# Patient Record
Sex: Male | Born: 1965
Health system: Southern US, Community
[De-identification: ages and names within clinical notes are randomized; demographics above are authoritative.]

## PROBLEM LIST (undated history)

## (undated) DIAGNOSIS — M1990 Unspecified osteoarthritis, unspecified site: Secondary | ICD-10-CM

## (undated) DIAGNOSIS — K760 Fatty (change of) liver, not elsewhere classified: Secondary | ICD-10-CM

## (undated) DIAGNOSIS — I739 Peripheral vascular disease, unspecified: Secondary | ICD-10-CM

## (undated) DIAGNOSIS — H409 Unspecified glaucoma: Secondary | ICD-10-CM

## (undated) DIAGNOSIS — Z9889 Other specified postprocedural states: Secondary | ICD-10-CM

## (undated) DIAGNOSIS — D126 Benign neoplasm of colon, unspecified: Secondary | ICD-10-CM

## (undated) DIAGNOSIS — J302 Other seasonal allergic rhinitis: Secondary | ICD-10-CM

## (undated) DIAGNOSIS — E1169 Type 2 diabetes mellitus with other specified complication: Secondary | ICD-10-CM

## (undated) DIAGNOSIS — H548 Legal blindness, as defined in USA: Secondary | ICD-10-CM

## (undated) DIAGNOSIS — I10 Essential (primary) hypertension: Secondary | ICD-10-CM

## (undated) DIAGNOSIS — I219 Acute myocardial infarction, unspecified: Secondary | ICD-10-CM

## (undated) DIAGNOSIS — H269 Unspecified cataract: Secondary | ICD-10-CM

## (undated) DIAGNOSIS — I2510 Atherosclerotic heart disease of native coronary artery without angina pectoris: Secondary | ICD-10-CM

## (undated) DIAGNOSIS — M5030 Other cervical disc degeneration, unspecified cervical region: Secondary | ICD-10-CM

## (undated) DIAGNOSIS — E11621 Type 2 diabetes mellitus with foot ulcer: Secondary | ICD-10-CM

## (undated) DIAGNOSIS — E119 Type 2 diabetes mellitus without complications: Secondary | ICD-10-CM

## (undated) DIAGNOSIS — E1142 Type 2 diabetes mellitus with diabetic polyneuropathy: Secondary | ICD-10-CM

## (undated) DIAGNOSIS — Z89512 Acquired absence of left leg below knee: Secondary | ICD-10-CM

## (undated) DIAGNOSIS — E1110 Type 2 diabetes mellitus with ketoacidosis without coma: Secondary | ICD-10-CM

## (undated) DIAGNOSIS — M25519 Pain in unspecified shoulder: Secondary | ICD-10-CM

## (undated) DIAGNOSIS — K219 Gastro-esophageal reflux disease without esophagitis: Secondary | ICD-10-CM

## (undated) DIAGNOSIS — R112 Nausea with vomiting, unspecified: Secondary | ICD-10-CM

## (undated) DIAGNOSIS — E785 Hyperlipidemia, unspecified: Secondary | ICD-10-CM

## (undated) DIAGNOSIS — R600 Localized edema: Secondary | ICD-10-CM

## (undated) DIAGNOSIS — N186 End stage renal disease: Secondary | ICD-10-CM

## (undated) DIAGNOSIS — L97505 Non-pressure chronic ulcer of other part of unspecified foot with muscle involvement without evidence of necrosis: Secondary | ICD-10-CM

## (undated) DIAGNOSIS — D649 Anemia, unspecified: Secondary | ICD-10-CM

## (undated) DIAGNOSIS — J45909 Unspecified asthma, uncomplicated: Secondary | ICD-10-CM

## (undated) DIAGNOSIS — Z992 Dependence on renal dialysis: Secondary | ICD-10-CM

## (undated) DIAGNOSIS — G629 Polyneuropathy, unspecified: Secondary | ICD-10-CM

## (undated) DIAGNOSIS — L97509 Non-pressure chronic ulcer of other part of unspecified foot with unspecified severity: Secondary | ICD-10-CM

## (undated) DIAGNOSIS — M869 Osteomyelitis, unspecified: Secondary | ICD-10-CM

## (undated) DIAGNOSIS — I519 Heart disease, unspecified: Secondary | ICD-10-CM

## (undated) DIAGNOSIS — K5790 Diverticulosis of intestine, part unspecified, without perforation or abscess without bleeding: Secondary | ICD-10-CM

## (undated) DIAGNOSIS — N184 Chronic kidney disease, stage 4 (severe): Secondary | ICD-10-CM

## (undated) DIAGNOSIS — M479 Spondylosis, unspecified: Secondary | ICD-10-CM

## (undated) HISTORY — DX: Type 2 diabetes mellitus without complications: E11.9

## (undated) HISTORY — DX: Chronic kidney disease, stage 4 (severe): N18.4

## (undated) HISTORY — DX: Unspecified cataract: H26.9

## (undated) HISTORY — DX: Essential (primary) hypertension: I10

## (undated) HISTORY — DX: Benign neoplasm of colon, unspecified: D12.6

## (undated) HISTORY — DX: Acquired absence of left leg below knee: Z89.512

## (undated) HISTORY — PX: VASECTOMY: SHX75

## (undated) HISTORY — DX: Hyperlipidemia, unspecified: E78.5

## (undated) HISTORY — DX: Type 2 diabetes mellitus with other specified complication: M86.9

## (undated) HISTORY — DX: Gilbert syndrome: E80.4

## (undated) HISTORY — DX: Osteomyelitis, unspecified: M86.9

## (undated) HISTORY — DX: Other cervical disc degeneration, unspecified cervical region: M50.30

## (undated) HISTORY — PX: CATARACT EXTRACTION W/ INTRAOCULAR LENS IMPLANT: SHX1309

## (undated) HISTORY — PX: CARDIAC CATHETERIZATION: SHX172

## (undated) HISTORY — DX: Acute myocardial infarction, unspecified: I21.9

## (undated) HISTORY — DX: Legal blindness, as defined in USA: H54.8

## (undated) HISTORY — DX: Diverticulosis of intestine, part unspecified, without perforation or abscess without bleeding: K57.90

## (undated) HISTORY — DX: Type 2 diabetes mellitus with diabetic polyneuropathy: E11.42

## (undated) HISTORY — DX: Non-pressure chronic ulcer of other part of unspecified foot with unspecified severity: L97.509

## (undated) HISTORY — DX: Peripheral vascular disease, unspecified: I73.9

## (undated) HISTORY — DX: Heart disease, unspecified: I51.9

## (undated) HISTORY — DX: Type 2 diabetes mellitus with foot ulcer: L97.505

## (undated) HISTORY — PX: CHOLECYSTECTOMY: SHX55

## (undated) HISTORY — PX: EPIBLEPHERON REPAIR WITH TEAR DUCT PROBING: SHX5617

## (undated) HISTORY — DX: Spondylosis, unspecified: M47.9

## (undated) HISTORY — PX: EYE SURGERY: SHX253

## (undated) HISTORY — DX: Type 2 diabetes mellitus with other specified complication: E11.69

## (undated) HISTORY — DX: Type 2 diabetes mellitus with foot ulcer: E11.621

## (undated) HISTORY — DX: Gastro-esophageal reflux disease without esophagitis: K21.9

## (undated) HISTORY — DX: Type 2 diabetes mellitus with ketoacidosis without coma: E11.10

---

## 1997-11-10 ENCOUNTER — Other Ambulatory Visit: Admission: RE | Admit: 1997-11-10 | Discharge: 1997-11-10 | Payer: Self-pay

## 1998-05-06 ENCOUNTER — Encounter: Payer: Self-pay | Admitting: Emergency Medicine

## 1998-05-06 ENCOUNTER — Emergency Department (HOSPITAL_COMMUNITY): Admission: EM | Admit: 1998-05-06 | Discharge: 1998-05-06 | Payer: Self-pay | Admitting: Emergency Medicine

## 2002-10-07 ENCOUNTER — Inpatient Hospital Stay (HOSPITAL_COMMUNITY): Admission: AD | Admit: 2002-10-07 | Discharge: 2002-10-10 | Payer: Self-pay | Admitting: Internal Medicine

## 2002-10-09 ENCOUNTER — Encounter: Payer: Self-pay | Admitting: Internal Medicine

## 2002-10-10 ENCOUNTER — Encounter (INDEPENDENT_AMBULATORY_CARE_PROVIDER_SITE_OTHER): Payer: Self-pay | Admitting: Internal Medicine

## 2005-04-14 HISTORY — PX: CORONARY ANGIOPLASTY WITH STENT PLACEMENT: SHX49

## 2005-05-12 ENCOUNTER — Other Ambulatory Visit: Payer: Self-pay

## 2005-05-13 ENCOUNTER — Observation Stay: Payer: Self-pay

## 2006-07-17 ENCOUNTER — Encounter (INDEPENDENT_AMBULATORY_CARE_PROVIDER_SITE_OTHER): Payer: Self-pay | Admitting: *Deleted

## 2006-07-17 ENCOUNTER — Ambulatory Visit: Payer: Self-pay | Admitting: Internal Medicine

## 2006-07-17 ENCOUNTER — Ambulatory Visit (HOSPITAL_COMMUNITY): Admission: RE | Admit: 2006-07-17 | Discharge: 2006-07-17 | Payer: Self-pay | Admitting: Internal Medicine

## 2006-07-17 DIAGNOSIS — R05 Cough: Secondary | ICD-10-CM | POA: Insufficient documentation

## 2006-07-17 DIAGNOSIS — I10 Essential (primary) hypertension: Secondary | ICD-10-CM | POA: Insufficient documentation

## 2006-07-17 DIAGNOSIS — I152 Hypertension secondary to endocrine disorders: Secondary | ICD-10-CM | POA: Insufficient documentation

## 2006-07-17 DIAGNOSIS — E1159 Type 2 diabetes mellitus with other circulatory complications: Secondary | ICD-10-CM | POA: Insufficient documentation

## 2006-07-17 DIAGNOSIS — I2510 Atherosclerotic heart disease of native coronary artery without angina pectoris: Secondary | ICD-10-CM | POA: Insufficient documentation

## 2006-07-17 DIAGNOSIS — E11319 Type 2 diabetes mellitus with unspecified diabetic retinopathy without macular edema: Secondary | ICD-10-CM | POA: Insufficient documentation

## 2006-07-17 DIAGNOSIS — E785 Hyperlipidemia, unspecified: Secondary | ICD-10-CM

## 2006-07-17 DIAGNOSIS — R059 Cough, unspecified: Secondary | ICD-10-CM | POA: Insufficient documentation

## 2006-07-17 DIAGNOSIS — E1139 Type 2 diabetes mellitus with other diabetic ophthalmic complication: Secondary | ICD-10-CM

## 2006-07-17 DIAGNOSIS — E118 Type 2 diabetes mellitus with unspecified complications: Secondary | ICD-10-CM | POA: Insufficient documentation

## 2006-07-17 DIAGNOSIS — E1169 Type 2 diabetes mellitus with other specified complication: Secondary | ICD-10-CM | POA: Insufficient documentation

## 2006-07-17 DIAGNOSIS — M1990 Unspecified osteoarthritis, unspecified site: Secondary | ICD-10-CM | POA: Insufficient documentation

## 2006-07-17 DIAGNOSIS — E1165 Type 2 diabetes mellitus with hyperglycemia: Secondary | ICD-10-CM

## 2006-07-17 LAB — CONVERTED CEMR LAB
Blood Glucose, Fingerstick: 299
Creatinine, Urine: 80.7 mg/dL
Hgb A1c MFr Bld: >14 %
Microalb Creat Ratio: 23.7 mg/g (ref 0.0–30.0)
Microalb, Ur: 1.91 mg/dL — ABNORMAL HIGH (ref 0.00–1.89)

## 2006-07-20 ENCOUNTER — Telehealth (INDEPENDENT_AMBULATORY_CARE_PROVIDER_SITE_OTHER): Payer: Self-pay | Admitting: *Deleted

## 2006-07-20 ENCOUNTER — Encounter (INDEPENDENT_AMBULATORY_CARE_PROVIDER_SITE_OTHER): Payer: Self-pay | Admitting: Internal Medicine

## 2006-07-20 LAB — CONVERTED CEMR LAB
ALT: 24 units/L (ref 0–53)
AST: 18 units/L (ref 0–37)
Albumin: 4.1 g/dL (ref 3.5–5.2)
Alkaline Phosphatase: 78 units/L (ref 39–117)
BUN: 7 mg/dL (ref 6–23)
CO2: 26 meq/L (ref 19–32)
Calcium: 9.5 mg/dL (ref 8.4–10.5)
Chloride: 102 meq/L (ref 96–112)
Cholesterol: 249 mg/dL — ABNORMAL HIGH (ref 0–200)
Creatinine, Ser: 0.72 mg/dL (ref 0.40–1.50)
Glucose, Bld: 304 mg/dL — ABNORMAL HIGH (ref 70–99)
HDL: 38 mg/dL — ABNORMAL LOW (ref >39)
LDL Cholesterol: 156 mg/dL — ABNORMAL HIGH (ref 0–99)
Potassium: 4.4 meq/L (ref 3.5–5.3)
Sodium: 134 meq/L — ABNORMAL LOW (ref 135–145)
Total Bilirubin: 1.9 mg/dL — ABNORMAL HIGH (ref 0.3–1.2)
Total CHOL/HDL Ratio: 6.6
Total Protein: 6.7 g/dL (ref 6.0–8.3)
Triglycerides: 273 mg/dL — ABNORMAL HIGH (ref <150)
VLDL: 55 mg/dL — ABNORMAL HIGH (ref 0–40)

## 2006-10-22 ENCOUNTER — Inpatient Hospital Stay (HOSPITAL_COMMUNITY): Admission: EM | Admit: 2006-10-22 | Discharge: 2006-10-23 | Payer: Self-pay | Admitting: Emergency Medicine

## 2006-10-22 ENCOUNTER — Ambulatory Visit: Payer: Self-pay | Admitting: Internal Medicine

## 2006-10-25 ENCOUNTER — Emergency Department (HOSPITAL_COMMUNITY): Admission: EM | Admit: 2006-10-25 | Discharge: 2006-10-25 | Payer: Self-pay | Admitting: Emergency Medicine

## 2006-10-26 ENCOUNTER — Encounter (INDEPENDENT_AMBULATORY_CARE_PROVIDER_SITE_OTHER): Payer: Self-pay | Admitting: Internal Medicine

## 2006-10-27 ENCOUNTER — Encounter (HOSPITAL_COMMUNITY): Admission: RE | Admit: 2006-10-27 | Discharge: 2006-11-05 | Payer: Self-pay | Admitting: Internal Medicine

## 2006-10-28 ENCOUNTER — Encounter (INDEPENDENT_AMBULATORY_CARE_PROVIDER_SITE_OTHER): Payer: Self-pay | Admitting: Internal Medicine

## 2009-04-14 DIAGNOSIS — I519 Heart disease, unspecified: Secondary | ICD-10-CM

## 2009-04-14 HISTORY — DX: Heart disease, unspecified: I51.9

## 2009-11-12 DIAGNOSIS — I219 Acute myocardial infarction, unspecified: Secondary | ICD-10-CM

## 2009-11-12 HISTORY — DX: Acute myocardial infarction, unspecified: I21.9

## 2009-11-14 ENCOUNTER — Ambulatory Visit: Payer: Self-pay | Admitting: Internal Medicine

## 2009-11-20 ENCOUNTER — Ambulatory Visit: Payer: Self-pay | Admitting: Cardiology

## 2009-11-20 DIAGNOSIS — Z955 Presence of coronary angioplasty implant and graft: Secondary | ICD-10-CM

## 2009-11-20 HISTORY — DX: Presence of coronary angioplasty implant and graft: Z95.5

## 2009-11-20 HISTORY — PX: CORONARY ANGIOPLASTY WITH STENT PLACEMENT: SHX49

## 2010-03-18 ENCOUNTER — Ambulatory Visit: Payer: Self-pay | Admitting: Internal Medicine

## 2010-05-14 NOTE — Assessment & Plan Note (Signed)
Summary: dsmt/dmr  DIABETES SELF MANAGEMENT TRAINING  Comments: dm x 20 years, last education was  ~ 1990. used to be  ~ 300#. lost weight when diagnosed intentionally and has been mainatining weight and good control on pills until last 2 months when he had been out of medications. Behavior change agreed on this visit: test CBG before breakfast and supper minimally, try to keep carbs consistent from day to day, meal to meal  1-Diabetes disease process: basic knowledge, reviewed more today 2- Exercise: on the job daily 3-Nutritional Management: describes intake as meals three times a day, small snack qama nd bedtime. watches sugar and carbs, Educated pt on carb consistency today and label reading. 4- Medications: on insulin previously, but not aware of relationship of insulin to carbs, insulin action. Marland Kitchen 5- Self-monitoring Blood Glucose: unable to name meter and not brought in today, usually tests daily, encouraged more testing especailly while transtioning to insulin. 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 6- Preventing,detecting,treating Chronic Complications: not covered today 7-Preventing,detecting treating acute complications: covered informally today, needs further assessment and review at f/u. 8- Changing habits: not covered today 9- Psychosocial adjustment: verbalized  F/U: as needed

## 2010-05-14 NOTE — Progress Notes (Signed)
Summary: Zocor  Phone Note Outgoing Call Call back at [facility] Phone [phone]   Call placed by: Pollyann Samples,  July 20, 2006 11:30 AM Summary of Call: Called to tell pt about Zocor. Left msg on answering machine.    Follow-up for Phone Call        Called pt's work number and left a msg fr him to call me. Follow-up by: Pollyann Samples,  July 21, 2006 9:31 AM  Additional Follow-up for Phone Call Additional follow up Details #1::        Called pt and informed him of the new Rx for Zocor. He requested that we call it in to the [facility] on [address] in [place]. Additional Follow-up by: Pollyann Samples,  July 21, 2006 4:52 PM   Additional Follow-up for Phone Call Additional follow up Details #2::    Called in Rx. Follow-up by: Pollyann Samples,  July 21, 2006 4:59 PM

## 2010-05-14 NOTE — Miscellaneous (Signed)
Summary: Hyperlipidemia; eval and treatment  Clinical Lists Changes  Medications: Added new medication of ZOCOR 20 MG TABS (SIMVASTATIN) Take 1 tablet by mouth at bedtime - Signed Rx of ZOCOR 20 MG TABS (SIMVASTATIN) Take 1 tablet by mouth at bedtime;  #31 x 2;  Signed;  Entered by: Deborra Medina MD;  Authorized by: Deborra Medina MD;  Method used: Telephoned to Orders: Added new Test order of T-Lipid Profile [phone]) - Signed Added new Test order of T-Hepatic Function [phone]) - Signed

## 2010-05-14 NOTE — Letter (Signed)
Summary: Discharge Summary-Dr. Albertine Patricia  Discharge Summary-Dr. Albertine Patricia   Imported By: Ollen Bowl 07/30/2006 09:46:10  _____________________________________________________________________  External Attachment:    Type:   Image     Comment:   External Document

## 2010-05-14 NOTE — Miscellaneous (Signed)
Summary: Consents: [facility]  Consents: [facility]   Imported By: Bonner Puna 10/27/2006 11:57:51  _____________________________________________________________________  External Attachment:    Type:   Image     Comment:   External Document
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
## 2010-08-27 NOTE — Discharge Summary (Signed)
NAME:  Chase Clark, Chase Clark NO.:  [id number]   MEDICAL RECORD NO.:  [id number]          PATIENT TYPE:  INP   LOCATION:  5501                         FACILITY:  [facility]   PHYSICIAN:  Lincoln Maxin, MD    DATE OF BIRTH:  [birth year]   DATE OF ADMISSION:  10/22/2006  DATE OF DISCHARGE:  10/23/2006                               DISCHARGE SUMMARY   CHIEF COMPLAINT:  Headache.   DISCHARGE DIAGNOSES:  1. Presumed [facility] spotted fever.  2. Diabetes.  3. Hypertension.  4. Hyperlipidemia.   DISCHARGE MEDICATIONS:  1. Zocor 40 mg one daily.  2. Lisinopril/hydrochlorothiazide 20/12.5 one daily.  3. NovoLog 70/30 14 units in a.m. and 28 units in p.m.  4. Metformin 1,000 mg twice daily.  5. Doxycycline 100 mg twice daily x13 days.   DISPOSITION/FOLLOWUP:  The patient is to follow up with Dr. Deborra Medina  in [place].  The patient and his wife both said they would call to  make a follow-up appointment.  At this follow-up appointment Dr. Derrel Nip  can check CBC and BMP.  It will also be important to monitor the  patient's blood pressure, as his blood pressure was over 240 on initial  presentation.   PROCEDURES:  1. Lumbar puncture on October 22, 2006.  2. CT head without contrast.  Impression:  No acute intracranial      abnormalities.  3. MRA head.  Impression:  Normal intracranial MR angiography of large      and medium size vessels dated October 22, 2006.  4. MRI of the brain with and without contrast on October 22, 2006.      Impression:  Normal MRI of the brain.   CULTURES:  1. CSF culture:  No growth x1 day.  2. CSF Gram stain:  No growth, white blood cells present,      predominantly mononuclear, red blood cells present, no organisms      seen.   CONSULTS:  None.   BRIEF HISTORY OF PRESENT ILLNESS:  The patient is a 46 year old white  male with a past medical history of diabetes type 2, hemoglobin 14.0,  hypertension, hyperlipidemia, who came to the ED  complaining of  headache.  The patient had one headache four days prior to arrival that  started suddenly and lasted about two minutes.  This headache resolved  by itself.  One day prior to arrival at about 10 p.m. the patient was  leaving church when he got another headache which came on suddenly.  This headache lasted about 20 minutes.  As the patient was walking out  of the church the headache was so painful it brought him to his knees.  He refers to the headache as the worst headache of his life.  The  patient became nauseous and started vomiting.  The patient then got in  his car and drove.  After 20 minutes the headache began to decrease but  did not resolve.  The patient vomited at least three times while  driving.  He had to pull over his car to vomit.  There was no blood  in  his vomit.  He was suffering from photophobia.  The patient states the  headache started in the back of his head and moved to the front.  The  patient complained of watery eyes.  The pain was described as squeezing  and bilateral.  When the patient lied flat the headache was accentuated.  The patient saw a chiropractor two weeks ago, which may or may not be  related.  The patient denies any aura, any dizziness, no changes in  vision, no fevers, no chills, no cough, no diarrhea.  The patient also  does acknowledge [place], and in the past two  weeks found at least two ticks on his body.   ALLERGIES:  No known drug allergies.   PAST MEDICAL HISTORY:  1. Diabetes, hemoglobin A1c greater than 14 in April of 2008.  2. Hypertension.  3. Hyperlipidemia.  4. The patient had a cath which showed an ejection fraction of 66%, no      wall motion abnormalities, no evidence of CAD.   HOME MEDICATIONS:  1. Janumet one tablet b.i.d., dosing is 50/1,000.  2. Lisinopril/hydrochlorothiazide 20/12.5 daily.  3. NovoLog 70/30 14 units in the a.m. and 28 units in the p.m.  4. Zocor 20 mg daily.  5.  Aspirin 81 mg daily.   SOCIAL HISTORY:  The patient is a current smoker of 1/2 pack per day x20  years.  He drinks occasionally, drink of choice is beer.  The patient  denies cocaine or IV drug use.  He has smoked marijuana in the past.  The patient is married.  He is currently working in [employer].  He  has a past history as an EMT for 12 years.  The patient's wife is a  Marine scientist.   FAMILY HISTORY:  The patient's mother has diabetes and hypertension.  The patient's father is deceased at 89 secondary to Kidron.  He also  had an MI which was a complication of his lymphoma treatment.  The  patient has two daughters, one 64 and one 48.   REVIEW OF SYSTEMS:  Negative except as noted in HPI.   PHYSICAL EXAMINATION:  VITAL SIGNS:  Temperature 97.2, blood pressure  201/128, pulse 94, respiratory rate 22, O2 99% on room air.  GENERAL:  The patient is lying in bed with the lights off in no acute  distress.  HEENT:  Eyes:  Anicteric.  No pallor.  ENT:  Mucous membranes moist.  No  erythema.  No discharge.  Midline tongue.  RESPIRATORY:  Good air movement.  Clear to auscultation bilaterally.  CARDIOVASCULAR:  Regular rate and rhythm.  Normal S1 and S2.  GI:  Positive bowel sounds.  Soft, nontender.  No distention.  EXTREMITIES:  Normal pulses.  No edema.  SKIN:  No rashes.  LYMPH:  No lymphadenopathy.  MUSCULOSKELETAL:  No deformities.  NEURO:  Alert and oriented x3.  Cranial nerves II-XII grossly intact.  5/5 muscular strength upper and lower extremities.  1+ deep tendon  reflexes and symmetric.  Sensation intact throughout upper and lower  extremities.  Finger-to-nose intact.   LABORATORY DATA:  CBC:  White count 14.7, hemoglobin 15.1, platelets  194, ANC 12.0, MCV 90.5.  BMP:  Sodium 133, potassium 4.8, chloride 105,  bicarb 24, BUN 15, creatinine 0.7, glucose 272.  LP was performed in the  ED and showed mil increase in opening pressure at 26 cm.  Glucose  slightly elevated at 145.   Protein  slightly elevated at 80.  There were  white blood cells present which were predominantly PMNs, red blood cells  as well with no organisms.  There were 2 white blood cells.  Red blood  cells were 86.   HOSPITAL COURSE:  1. Headache.  The patient came in stating the headache was the worst      headache of his life.  Initially the thought was to rule out      subarachnoid hemorrhage.  CT of the head was done with was negative      for any hemorrhage or acute abnormality.  To reaffirm this a lumbar      puncture was done which was negative except for a small amount of      traumatic blood.  Other causes considered for headache included      [facility] spotted fever with history of tick exposure.  [facility] spotted fever can present with headache and myalgias.  The      patient does not have any rash and only mildly elevated fever.  The      patient does have increased protein and increased glucose in CSF      which is common with [facility] spotted fever.  On admission      the patient also had increased T-bili at 1.8 which can commonly be      seen with [facility] spotted fever.  The rest of the LFTs were      normal, alkaline phosphatase 43, AST 20, ALT 24.  Intoxication was      considered.  Alcohol level was obtained and was less than 5. UDS      was also negative except for opiates which the patient received in      the ED.  A hypertensive malignancy could have been the cause for      this headache.  Hypertensive malignancy could have occurred because      the patient's blood pressure was elevated at 201.  Upon arrival in      the ED the blood pressure dropped coinciding with the headache      subsequently being relieved.  The patient does not have a history      of migraines nor a family history of migraines.  Migraines are less      typical in individuals greater than 30 years ago.  The patient also      had no aura, yet this possibility cannot be  completely ruled out.      Other causes such as viral or bacterial meningitis are less likely      as well.  The patient's CSF was not typical for a meningitis type      syndrome, and the patient had no nuchal rigidity.  The patient was      afebrile.  Lastly, things like cluster headaches could potentially      be a cause.  Cluster headaches typically occur much more frequently      in males but often are unilateral and occurring behind one eye.      The patient was brought into the hospital and started on      doxycycline prophylactically 100 mg p.o. b.i.d.  Upon admission the      patient's headache resolved.  The headache did not recur overnight,      and the next morning the patient's white count had gone down to      9.1.  This made [place] feel like the diagnosis of [facility]      spotted fever was more likely, and we discharged the patient with      an additional 13 days of doxycycline making it a full course of 14      days.  2. Hypertension.  On admission the patient's systolic blood pressure      was greater than 200.  This could have been secondary to anxiety,      but also could have been the cause for his headache.  After      receiving blood pressure medications, the patient's blood pressure      remained between the 120s to 140s.  At discharge the patient's home      medications were restarted which included lisinopril 20,      hydrochlorothiazide 12.5.  3. Diabetes.  The patient's blood sugar ranged from 183-230.  The      patient had a hemoglobin A1c drawn which was 9.9, and this was an      improvement over his last hemoglobin A1c which was greater than 14.      The patient states he is compliant with his home diabetes      medications.  The patient takes his metformin 1 gm b.i.d.  The      patient also takes NovoLog 70/30 at 14 units in the a.m. and 28      units in the p.m.  The patient states that this regimen is atypical      for most patients, but in working with  Dr. Derrel Nip it was found that      his glucose spikes overnight, and this regimen is appropriate for      him.  Dr. Derrel Nip has established a long relationship with this      patient and we will defer to her management.  4. Hyperlipidemia.  Fasting lipid panel was obtained while the patient      was hospitalized.  The patient's total cholesterol was 200,      triglycerides 227, HDL 34, LDL 121.  The patient was currently on      Zocor 20 mg.  While hospitalized the patient's Zocor was increased      to 40 mg.  No other changes to hyperlipidemia medications were      made, although adding a medication like niacin can be considered by      the patient's primary care physician.  The patient has multiple      risk factors for ACS including diabetes, hypertension,      hyperlipidemia and smoking.   DISCHARGE LABS AND VITALS:  Temperature 99.2, blood pressure 137/87,  pulse 83, respiratory rate 18, satting 97% on room air.  CBC:  White  count 9.1, hemoglobin  14.5, platelets 186.  BMP:  Sodium 135, potassium  4.0, chloride 103, bicarb 26, BUN 11, creatinine 0.8, glucose 339,  calcium 8.8.      Lincoln Maxin, MD  Electronically Signed     JP/MEDQ  D:  10/23/2006  T:  10/24/2006  Job:  [id number]   cc:   Deborra Medina, M.D.

## 2010-08-30 NOTE — H&P (Signed)
NAME:  Chase Clark, HAUBNER NO.:  [id number]   MEDICAL RECORD NO.:  [id number]                   PATIENT TYPE:  INP   LOCATION:                                       FACILITY:  [facility]   PHYSICIAN:  Deboraha Sprang, M.D.               DATE OF BIRTH:  1966-01-24   DATE OF ADMISSION:  10/07/2002  DATE OF DISCHARGE:  10/10/2002                                HISTORY & PHYSICAL   CHIEF COMPLAINT:  Abnormal Cardiolite.   HISTORY OF PRESENT ILLNESS:  Mr. Moskwa is a very pleasant 45 year old  male, with no known history of coronary artery disease, but a past medical  history of diabetes mellitus type 2, insulin-dependent, hypertension, and  treated hyperlipidemia.  He was originally seen by Dr. Vicenta Aly in 2002,  with complaints of chest pain.  At that time he had a Cardiolite performed  that was negative.  Recently he decided to have follow-up testing performed.  He denies any symptoms of chest pain, shortness of breath, PND, orthopnea,  syncope, palpitations, diaphoresis, nausea, or arm pain.  He was referred by  Dr. Virgina Jock for stress Cardiolite.  His Cardiolite is positive for inferior  ischemia.  He is seen in the office today to be set up for cardiac  catheterization.  During his time in the office, he did develop some chest  burning and it has been decided to admit him to rule out MI and  catheterization as soon as possible.   PAST MEDICAL HISTORY:  1. Diabetes mellitus, type 2, insulin dependent.  2. Hypertension.  3. Treated hyperlipidemia.  4. Status post vasectomy.  5. History of elevated LFT's.     a. Status post liver biopsy.  Okay per his report.   CURRENT MEDICATIONS:  1. Glucotrol 15 mg daily.  2. Glucophage 500 mg 2 tablets daily.  3. Lipitor 2 mg q.h.s.  4. Aceon 4 mg daily.  5. Aspirin 81 mg daily.  6. Lantus 17 units q.h.s.   ALLERGIES:  No known drug allergies.   SOCIAL HISTORY:  The patient is an ex-smoker, quit about ten months  ago.  He  used to smoke anywhere from less than half a pack per day for ten years.  He  denies any drug use.  He drinks alcohol on occasion.  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   FAMILY HISTORY:  Significant for remote coronary artery disease.  His father  did die from a cardiomyopathy, sounds like it was secondary to chemotherapy  or radiation.  He has two uncles that had coronary artery disease.   REVIEW OF SYSTEMS:  See HPI.  He denies any known hematochezia, dysuria,  hematuria, rashes, myalgias, arthralgias.  He has been under quite a bit of  stress recently.   PHYSICAL EXAMINATION:  GENERAL:  Well-nourished, well developed male in no  acute distress.  VITAL SIGNS:  Blood pressure 132/95.  Pulse 83.  Weight 225 lbs.  HEENT:  Unremarkable.  PERRL.  EOMI.  Sclerae white.  NECK:  Without carotid bruits or thyromegaly.  CARDIAC:  Normal S1, S2, regular rate and rhythm.  LUNGS:  Clear to auscultation bilaterally.  ABDOMEN:  Soft, nontender.  No bruits.  No masses.  VASCULAR:  With 2+ femoral artery, dorsalis pedis, and posterior tibialis  pulses with no femoral artery bruits bilaterally.  EXTREMITIES:  Without edema.  NEUROLOGIC:  Grossly intact.   Electrocardiogram in the office today revealed normal sinus rhythm, heart  rate 83, normal axis.  Nonspecific ST-T wave changes.   IMPRESSION:  1. Unstable angina.  2. Abnormal Cardiolite.  3. Cardiac risk factors.     a. Diabetes mellitus, type 2, insulin-dependent.     b. Treated hypertension.     c. Treated hyperlipidemia.     d. Remote family history of coronary artery disease.     e. Ex-smoker.  4. History of elevated LFT's with a negative liver biopsy.  5. History of vasectomy.   PLAN:  The patient will be admitted to [facility] today.  We have  given him Lovenox 100 mg subcu in the office, and this will be continued as  an inpatient.  We will  start him on Toprol XL 25 mg daily, as well as a full-  strength aspirin daily.  We will rule him out for MI.  We will try to  schedule him for the cath today or Monday, if necessary.  If his enzymes  return positive or if he has unrelenting chest pain, we may need to go  urgently to the cath lab.         Richardson Dopp, P.A.                        Deboraha Sprang, M.D.    SW/MEDQ  D:  10/07/2002  T:  10/08/2002  Job:  [id number]   cc:   Precious Reel, M.D.  [address]  [place]  [place] [address]  Fax: [phone]    cc:   Precious Reel, M.D.  [address]  [place]  [place] [address]  Fax: [phone]

## 2010-08-30 NOTE — Cardiovascular Report (Signed)
NAME:  Chase Clark, Chase Clark                         ACCOUNT NO.:  [id number]   MEDICAL RECORD NO.:  [id number]                   PATIENT TYPE:  INP   LOCATION:  [id number]                                 FACILITY:  [facility]   PHYSICIAN:  Ethelle Lyon, M.D.             DATE OF BIRTH:  1965/11/17   DATE OF PROCEDURE:  10/10/2002  DATE OF DISCHARGE:                              CARDIAC CATHETERIZATION   PROCEDURE:  Left heart catheterization, left ventriculography, coronary  angiography.   INDICATIONS:  The patient is a 45 year old gentleman with risk factors of  diabetes, dyslipidemia, and hypertension.  He has had no chest pain, but  recently underwent an ETT Cardiolite which demonstrated inferior ischemia.  Subsequent to that he had chest discomfort and was admitted to hospital.  He  ruled out for myocardial infarction.  He is referred for diagnostic  angiography.   PROCEDURAL TECHNIQUE:  Informed consent was obtained.  Under 1% lidocaine  local anesthesia a 6-French sheath was placed in the right femoral artery  using the modified Seldinger technique.  Diagnostic angiography and  ventriculography were performed using JL4, JR4, and pigtail catheters.  The  patient tolerated the procedure well and was transferred to the holding room  in stable condition.  Sheaths are to be removed there.   COMPLICATIONS:  None.   FINDINGS:  1. LV 123/7/12.  EF 66% without regional wall motion abnormality.  2. No aortic stenosis or mitral regurgitation.  3. Left main:  Very long vessel which arises from the left coronary cusp.     It is angiographically normal.  4. LAD:  The LAD is a relatively small vessel giving rise to one large     diagonal branch.  The LAD barely reaches the apex of the heart.  While     the vessel is small, there is no evident stenosis.  5. Ramus intermedius:  Small vessel which is angiographically normal.  6. Circumflex:  Moderately sized vessel giving rise to a single  branching     obtuse marginal.  It is angiographically normal.  7. RCA:  Moderate sized, dominant vessel.  It is angiographically normal.   IMPRESSION/RECOMMENDATIONS:  Angiographically normal coronary arteries with  normal left ventricular size and systolic function.  No aortic stenosis or  mitral regurgitation.   Will add ACE inhibitor for primary prevention in the setting of his diabetes  mellitus.  Recommend continued aspirin as well.                                               Ethelle Lyon, M.D.    WED/MEDQ  D:  10/10/2002  T:  10/10/2002  Job:  [id number]  Precious Reel, M.D.  [address]  [place]  [place] [address]  Fax: [phone]  Junious Silk, M.D. [facility]   cc:   Precious Reel, M.D.  [address]  [place]  [place] [address]  Fax: [phone]   Junious Silk, M.D. [facility]

## 2010-08-30 NOTE — Discharge Summary (Signed)
NAME:  Chase Clark, Chase Clark                         ACCOUNT NO.:  [id number]   MEDICAL RECORD NO.:  [id number]                   PATIENT TYPE:  INP   LOCATION:  [id number]                                 FACILITY:  [facility]   PHYSICIAN:  Ethelle Lyon, M.D.             DATE OF BIRTH:  1965/10/20   DATE OF ADMISSION:  10/07/2002  DATE OF DISCHARGE:  10/10/2002                           DISCHARGE SUMMARY - REFERRING   SUMMARY OF HISTORY:  Chase Clark is a 45 year old white male who has  multiple cardiac risk factors for coronary artery disease including type 2  diabetes, hypertension, hyperlipidemia.  He recently underwent a Cardiolite  stress test at his request and this was found to be abnormal, showing an  inferior ischemia and EF of 52%.  Thus, he was admitted for cardiac  catheterization for further evaluation.  The chest discomfort he describes  as a burning and it is nonspecific.  He was admitted from the office.  His  history is also notable for remote tobacco use.   LABORATORY DATA:  Two CK-MBs and troponins were negative for myocardial  infarction.  TSH was 1.289.  Admission sodium was 137, potassium 3.7, BUN  12, creatinine 0.8, glucose 211, normal LFTs.  PT 14.0, INR 1.1.  H&H 15.3  and 46.8, normal indices, platelets 182, wbc's 8.4.  EKG shows normal sinus  rhythm, normal axis, early R waves, nonspecific ST-T wave changes.   HOSPITAL COURSE:  Chase Clark was admitted to 3700 with anticipation of  cardiac catheterization to further evaluate his atypical chest discomfort  and abnormal Cardiolite.  He was continued on his home medications.  The  diabetes coordinator saw the patient in regards to his history of diabetes  and hyperglycemia.  His Glucophage was held in anticipation of cardiac  catheterization.  This was performed on October 07, 2002 without difficulty.  According to Dr. Christy Sartorius notes he did not have any coronary artery disease.  EF was 66%.  He had a small LAD.  Dr.  Albertine Patricia felt that he should be on an  ACE inhibitor for prevention; however, he is already on Aceon and thus will  not change it at this time.  The diabetes coordinator noted that his  hemoglobin A1c was 10.5.  However, he has been following with Dr. Virgina Jock very  closely in regards to his sugars and has a drastic improvement over the last  several weeks, and recommended continued follow-up.  Postcatheterization,  sheath removal, and bedrest he was ambulating without difficulty;  catheterization site was intact.  It was felt that he could be discharged  home.   DISCHARGE DIAGNOSES:  1. Noncardiac chest discomfort.  2. No coronary artery disease as previously described.  3. Multiple cardiac risk factors include diabetes, hypertension,     hyperlipidemia, a false positive Cardiolite, remote tobacco use.   DISPOSITION:  1. He is discharged home.  2. He is asked to continue  his home medications.  These include:     a. Glucotrol XL 15 mg daily.     b. He was asked to resume his Glucophage 500 mg two tablets daily on        Wednesday.     c. Lipitor 10 mg q.h.s.     d. Aceon 4 mg daily.     e. [name] aspirin 81 mg daily.     f. Lantus 17 units q.h.s.  3. He was advised no lifting, driving, sexual activity, or heavy exertion     for two days.  4. Maintain low salt/fat/cholesterol ADA diet.  5. If he had any problems with his catheterization site he was asked to call     [place] immediately.  6. He was asked to arrange a follow-up one- to two-week appointment with Dr.     Virgina Jock for further evaluation of his chest discomfort if it should     persist.  7. We encourage cardiac risk factor modification.  Target LDL should be 70     or less in regards to hyperlipidemia treatment.  We will have Dr. Virgina Jock     follow this accordingly.     Sharyl Nimrod, P.A. [facility]                    Ethelle Lyon, M.D.    EW/MEDQ  D:  10/10/2002  T:  10/10/2002  Job:  [id number]   cc:   Precious Reel, M.D.  [address]  [place]  [place] [address]  Fax: [phone]    cc:   Precious Reel, M.D.  [address]  [place]  [place] [address]  Fax: [phone]

## 2011-01-28 LAB — COMPREHENSIVE METABOLIC PANEL
ALT: 24
AST: 20
Albumin: 3.7
Alkaline Phosphatase: 43
BUN: 12
CO2: 26
Calcium: 9.1
Chloride: 103
Creatinine, Ser: 0.74
GFR calc Af Amer: >60
GFR calc non Af Amer: >60
Glucose, Bld: 240 — ABNORMAL HIGH
Potassium: 4.2
Sodium: 136
Total Bilirubin: 1.8 — ABNORMAL HIGH
Total Protein: 6.3

## 2011-01-28 LAB — CSF CELL COUNT WITH DIFFERENTIAL
RBC Count, CSF: 72 — ABNORMAL HIGH
RBC Count, CSF: 86 — ABNORMAL HIGH
Tube #: 1
Tube #: 4
WBC, CSF: 2
WBC, CSF: 2

## 2011-01-28 LAB — URINALYSIS, ROUTINE W REFLEX MICROSCOPIC
Bilirubin Urine: NEGATIVE
Glucose, UA: >1000 — AB
Hgb urine dipstick: NEGATIVE
Ketones, ur: 40 — AB
Leukocytes, UA: NEGATIVE
Nitrite: NEGATIVE
Protein, ur: NEGATIVE
Specific Gravity, Urine: 1.038 — ABNORMAL HIGH
Urobilinogen, UA: 0.2
pH: 5.5

## 2011-01-28 LAB — MISCELLANEOUS TEST

## 2011-01-28 LAB — ETHANOL: Alcohol, Ethyl (B): <5

## 2011-01-28 LAB — CBC
HCT: 41.4
HCT: 43.3
Hemoglobin: 14.5
Hemoglobin: 15.1
MCHC: 34.9
MCHC: 35.1
MCV: 90.5
MCV: 91.7
Platelets: 184
Platelets: 186
RBC: 4.51
RBC: 4.78
RDW: 13.1
RDW: 13.2
WBC: 14.7 — ABNORMAL HIGH
WBC: 9.1

## 2011-01-28 LAB — POCT I-STAT CREATININE
Creatinine, Ser: 0.7
Creatinine, Ser: 0.8
Operator id: 192351
Operator id: 277751

## 2011-01-28 LAB — BASIC METABOLIC PANEL
BUN: 11
CO2: 26
Calcium: 8.8
Chloride: 103
Creatinine, Ser: 0.8
GFR calc Af Amer: >60
GFR calc non Af Amer: >60
Glucose, Bld: 339 — ABNORMAL HIGH
Potassium: 4
Sodium: 135

## 2011-01-28 LAB — LIPID PANEL
Cholesterol: 200
HDL: 34 — ABNORMAL LOW
LDL Cholesterol: 121 — ABNORMAL HIGH
Total CHOL/HDL Ratio: 5.9
Triglycerides: 227 — ABNORMAL HIGH
VLDL: 45 — ABNORMAL HIGH

## 2011-01-28 LAB — I-STAT 8, (EC8 V) (CONVERTED LAB)
Acid-base deficit: 1
BUN: 13
BUN: 15
Bicarbonate: 22.8
Bicarbonate: 27.6 — ABNORMAL HIGH
Chloride: 102
Chloride: 105
Glucose, Bld: 247 — ABNORMAL HIGH
Glucose, Bld: 272 — ABNORMAL HIGH
HCT: 46
HCT: 47
Hemoglobin: 15.6
Hemoglobin: 16
Operator id: 192351
Operator id: 277751
Potassium: 4
Potassium: 4.8
Sodium: 133 — ABNORMAL LOW
Sodium: 136
TCO2: 24
TCO2: 29
pCO2, Ven: 34.6 — ABNORMAL LOW
pCO2, Ven: 53.9 — ABNORMAL HIGH
pH, Ven: 7.318 — ABNORMAL HIGH
pH, Ven: 7.427 — ABNORMAL HIGH

## 2011-01-28 LAB — URINE MICROSCOPIC-ADD ON

## 2011-01-28 LAB — DIFFERENTIAL
Basophils Absolute: 0
Basophils Relative: 0
Eosinophils Absolute: 0.3
Eosinophils Relative: 2
Lymphocytes Relative: 11 — ABNORMAL LOW
Lymphs Abs: 1.7
Monocytes Absolute: 0.7
Monocytes Relative: 5
Neutro Abs: 12 — ABNORMAL HIGH
Neutrophils Relative %: 82 — ABNORMAL HIGH

## 2011-01-28 LAB — RAPID URINE DRUG SCREEN, HOSP PERFORMED
Amphetamines: NOT DETECTED
Barbiturates: NOT DETECTED
Benzodiazepines: NOT DETECTED
Cocaine: NOT DETECTED
Opiates: POSITIVE — AB
Tetrahydrocannabinol: NOT DETECTED

## 2011-01-28 LAB — GRAM STAIN

## 2011-01-28 LAB — PROTIME-INR
INR: 1
Prothrombin Time: 13.5

## 2011-01-28 LAB — PROTEIN AND GLUCOSE, CSF
Glucose, CSF: 145 — ABNORMAL HIGH
Total  Protein, CSF: 80 — ABNORMAL HIGH

## 2011-01-28 LAB — APTT: aPTT: 25

## 2011-01-28 LAB — CSF CULTURE W GRAM STAIN: Culture: NO GROWTH

## 2011-01-28 LAB — HEMOGLOBIN A1C: Hgb A1c MFr Bld: 9.9 — ABNORMAL HIGH

## 2011-07-18 ENCOUNTER — Ambulatory Visit: Payer: Self-pay | Admitting: Gastroenterology

## 2011-07-30 ENCOUNTER — Ambulatory Visit: Payer: Self-pay | Admitting: Surgery

## 2011-08-05 LAB — PATHOLOGY REPORT

## 2011-12-19 ENCOUNTER — Ambulatory Visit: Payer: Self-pay | Admitting: Gastroenterology

## 2011-12-22 LAB — PATHOLOGY REPORT

## 2012-01-09 LAB — HM COLONOSCOPY: HM Colonoscopy: NORMAL

## 2012-11-17 DIAGNOSIS — L97509 Non-pressure chronic ulcer of other part of unspecified foot with unspecified severity: Secondary | ICD-10-CM

## 2012-11-17 HISTORY — DX: Non-pressure chronic ulcer of other part of unspecified foot with unspecified severity: L97.509

## 2012-12-29 ENCOUNTER — Encounter: Payer: Self-pay | Admitting: *Deleted

## 2012-12-29 DIAGNOSIS — E11621 Type 2 diabetes mellitus with foot ulcer: Secondary | ICD-10-CM | POA: Insufficient documentation

## 2013-01-12 ENCOUNTER — Ambulatory Visit: Payer: Self-pay | Admitting: Podiatry

## 2013-04-04 ENCOUNTER — Ambulatory Visit (INDEPENDENT_AMBULATORY_CARE_PROVIDER_SITE_OTHER): Payer: Managed Care, Other (non HMO) | Admitting: Podiatry

## 2013-04-04 ENCOUNTER — Encounter: Payer: Self-pay | Admitting: Podiatry

## 2013-04-04 VITALS — BP 145/95 | HR 84 | Resp 16

## 2013-04-04 DIAGNOSIS — L97509 Non-pressure chronic ulcer of other part of unspecified foot with unspecified severity: Secondary | ICD-10-CM

## 2013-04-04 MED ORDER — CEPHALEXIN 500 MG PO CAPS: 500.0000 mg | ORAL_CAPSULE | Freq: Three times a day (TID) | ORAL | Status: DC

## 2013-04-04 MED ORDER — GABAPENTIN 300 MG PO CAPS: 300.0000 mg | ORAL_CAPSULE | Freq: Two times a day (BID) | ORAL | Status: DC

## 2013-04-04 NOTE — Progress Notes (Signed)
[name] presents today for followup of his left ankle ulceration. He states that I had healed 100% until he tried to break in a new pair of boots. It rubs sore my ankle as it did with the other areas that this one has not healed as she refers to his left ankle.  Objective: Vital signs are stable he is alert and oriented x3. There is mild erythema surrounding approximately a 1 cm lesion to the lateral malleolus very superficial with with no purulence.  Assessment: Superficial ulceration mild cellulitis lateral malleolus left.  Plan: started him on Keflex 500 mg 3 times a day. He'll continue soaks and dressing changes. I did refill his gabapentin 300 mg 1 twice daily for 90 day supply x3 refills

## 2013-04-14 DIAGNOSIS — K3184 Gastroparesis: Secondary | ICD-10-CM

## 2013-04-14 HISTORY — DX: Gastroparesis: K31.84

## 2013-05-04 ENCOUNTER — Encounter: Payer: Self-pay | Admitting: Podiatry

## 2013-05-04 ENCOUNTER — Ambulatory Visit (INDEPENDENT_AMBULATORY_CARE_PROVIDER_SITE_OTHER): Payer: Managed Care, Other (non HMO) | Admitting: Podiatry

## 2013-05-04 ENCOUNTER — Ambulatory Visit (INDEPENDENT_AMBULATORY_CARE_PROVIDER_SITE_OTHER): Payer: Managed Care, Other (non HMO)

## 2013-05-04 VITALS — BP 150/105 | HR 85 | Resp 16 | Ht 71.0 in | Wt 210.0 lb

## 2013-05-04 DIAGNOSIS — M79671 Pain in right foot: Secondary | ICD-10-CM

## 2013-05-04 DIAGNOSIS — M79609 Pain in unspecified limb: Secondary | ICD-10-CM

## 2013-05-04 DIAGNOSIS — M629 Disorder of muscle, unspecified: Secondary | ICD-10-CM

## 2013-05-04 DIAGNOSIS — M2420 Disorder of ligament, unspecified site: Secondary | ICD-10-CM

## 2013-05-04 NOTE — Progress Notes (Signed)
He presents today stating that Friday he felt something pull in the bottom of his right foot. He has had a history of plantar fasciitis.  Objective: Vital signs are stable he is alert and oriented x3. Pulses are palpable right lower extremity. Pain on palpation to the plantar central and plantar medial band of the plantar fascia right foot. Radiographic evaluation demonstrates what appears to be a disruption in the ligament just distal to its insertion site of the calcaneus.  Assessment: Plantar fascial tear right foot.  Plan: The use of the Cam Walker for the next 4 weeks I will followup with him at that time.

## 2013-05-19 ENCOUNTER — Telehealth: Payer: Self-pay | Admitting: Internal Medicine

## 2013-05-19 NOTE — Telephone Encounter (Signed)
The patient was a former patient of Dr. Lupita Dawn per his wife [name] . He was going to [facility] ,now he is wanting to re-establish with Dr. Derrel Nip.

## 2013-05-19 NOTE — Telephone Encounter (Signed)
Please advise 

## 2013-05-20 NOTE — Telephone Encounter (Signed)
Of course,  Spouses of current patients can be accepted.

## 2013-05-24 NOTE — Telephone Encounter (Signed)
The patient has been scheduled

## 2013-05-25 ENCOUNTER — Other Ambulatory Visit: Payer: Self-pay | Admitting: Internal Medicine

## 2013-05-25 MED ORDER — LISINOPRIL-HYDROCHLOROTHIAZIDE 10-12.5 MG PO TABS
2.0000 | ORAL_TABLET | Freq: Every day | ORAL | Status: DC
Start: 2013-05-25 — End: 2013-06-13

## 2013-06-01 ENCOUNTER — Ambulatory Visit: Payer: Managed Care, Other (non HMO) | Admitting: Podiatry

## 2013-06-10 ENCOUNTER — Encounter (INDEPENDENT_AMBULATORY_CARE_PROVIDER_SITE_OTHER): Payer: Self-pay

## 2013-06-10 ENCOUNTER — Encounter: Payer: Self-pay | Admitting: Internal Medicine

## 2013-06-10 ENCOUNTER — Ambulatory Visit (INDEPENDENT_AMBULATORY_CARE_PROVIDER_SITE_OTHER): Payer: Managed Care, Other (non HMO) | Admitting: Internal Medicine

## 2013-06-10 VITALS — BP 118/74 | HR 96 | Temp 97.6°F | Resp 18 | Ht 70.0 in | Wt 218.0 lb

## 2013-06-10 DIAGNOSIS — Z888 Allergy status to other drugs, medicaments and biological substances status: Secondary | ICD-10-CM

## 2013-06-10 DIAGNOSIS — E1139 Type 2 diabetes mellitus with other diabetic ophthalmic complication: Secondary | ICD-10-CM

## 2013-06-10 DIAGNOSIS — IMO0001 Reserved for inherently not codable concepts without codable children: Secondary | ICD-10-CM

## 2013-06-10 DIAGNOSIS — E1165 Type 2 diabetes mellitus with hyperglycemia: Secondary | ICD-10-CM

## 2013-06-10 DIAGNOSIS — E785 Hyperlipidemia, unspecified: Secondary | ICD-10-CM

## 2013-06-10 DIAGNOSIS — N529 Male erectile dysfunction, unspecified: Secondary | ICD-10-CM

## 2013-06-10 DIAGNOSIS — I10 Essential (primary) hypertension: Secondary | ICD-10-CM

## 2013-06-10 DIAGNOSIS — G589 Mononeuropathy, unspecified: Secondary | ICD-10-CM

## 2013-06-10 DIAGNOSIS — I2510 Atherosclerotic heart disease of native coronary artery without angina pectoris: Secondary | ICD-10-CM

## 2013-06-10 DIAGNOSIS — G629 Polyneuropathy, unspecified: Secondary | ICD-10-CM

## 2013-06-10 DIAGNOSIS — Z789 Other specified health status: Secondary | ICD-10-CM

## 2013-06-10 DIAGNOSIS — M25519 Pain in unspecified shoulder: Secondary | ICD-10-CM

## 2013-06-10 DIAGNOSIS — Z23 Encounter for immunization: Secondary | ICD-10-CM

## 2013-06-10 DIAGNOSIS — E11319 Type 2 diabetes mellitus with unspecified diabetic retinopathy without macular edema: Secondary | ICD-10-CM

## 2013-06-10 LAB — COMPREHENSIVE METABOLIC PANEL
ALT: 27 U/L (ref 0–53)
AST: 28 U/L (ref 0–37)
Albumin: 4.5 g/dL (ref 3.5–5.2)
Alkaline Phosphatase: 66 U/L (ref 39–117)
BUN: 22 mg/dL (ref 6–23)
CO2: 26 mEq/L (ref 19–32)
Calcium: 10.1 mg/dL (ref 8.4–10.5)
Chloride: 101 mEq/L (ref 96–112)
Creat: 1.15 mg/dL (ref 0.50–1.35)
Glucose, Bld: 140 mg/dL — ABNORMAL HIGH (ref 70–99)
Potassium: 4.7 mEq/L (ref 3.5–5.3)
Sodium: 137 mEq/L (ref 135–145)
Total Bilirubin: 1.4 mg/dL — ABNORMAL HIGH (ref 0.2–1.2)
Total Protein: 6.8 g/dL (ref 6.0–8.3)

## 2013-06-10 LAB — LDL CHOLESTEROL, DIRECT: Direct LDL: 157 mg/dL — ABNORMAL HIGH

## 2013-06-10 LAB — HEMOGLOBIN A1C
Hgb A1c MFr Bld: 12.6 % — ABNORMAL HIGH (ref <5.7)
Mean Plasma Glucose: 315 mg/dL — ABNORMAL HIGH (ref <117)

## 2013-06-10 LAB — HM DIABETES FOOT EXAM: HM Diabetic Foot Exam: NORMAL

## 2013-06-10 LAB — VITAMIN B12: Vitamin B-12: 878 pg/mL (ref 211–911)

## 2013-06-10 LAB — TSH: TSH: 0.894 u[IU]/mL (ref 0.350–4.500)

## 2013-06-10 MED ORDER — INSULIN REGULAR HUMAN (CONC) 500 UNIT/ML ~~LOC~~ SOLN: SUBCUTANEOUS | Status: DC

## 2013-06-10 MED ORDER — GABAPENTIN 300 MG PO CAPS: 300.0000 mg | ORAL_CAPSULE | Freq: Three times a day (TID) | ORAL | Status: DC

## 2013-06-10 NOTE — Progress Notes (Signed)
Pre-visit discussion using our clinic review tool. No additional management support is needed unless otherwise documented below in the visit note.  

## 2013-06-10 NOTE — Patient Instructions (Addendum)
>  Let's try Increasing the gabapentin to 300 mg three times daily.  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 This may change depending on your insurance coverage of PPIs

## 2013-06-10 NOTE — Progress Notes (Signed)
Patient ID: Chase Clark, male   DOB: 09/09/1965, 48 y.o.   MRN: [id number]   Patient Active Problem List   Diagnosis Date Noted  . 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 Obesity (BMI 30-39.9) 06/12/2013  . Statin intolerance 06/10/2013  . Ulcer of foot   . DIABETES MELLITUS, TYPE II, UNCONTROLLED 07/17/2006  . HYPERLIPIDEMIA 07/17/2006  . GILBERT'S SYNDROME 07/17/2006  . HYPERTENSION 07/17/2006  . CORONARY ARTERY DISEASE 07/17/2006  . OSTEOARTHRITIS 07/17/2006  . COUGH 07/17/2006    Subjective:  CC:   Chief Complaint  Patient presents with  . Establish Care    HPI:   Chase Clark is a 48 y.o. male who presents as a new patient to establish primary care with the chief complaint of  Poorly controlled diabetes, with no follow up in nearly a year and no monitoring of diabetes in 6 months.  Does not have a functioning glucometer. Attributes his noncompliance to distraction and preoccupation with his wife Terra's health problems.  His diabetes was Previously managed by Marisue Brooklyn, [facility].  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  Has macular degeneration, overdue for follow up with [facility].   Has tried increasing dose of gabapentin to 600 mg but did not tolerate dose due to mental status changes   Nocturnal Leg cramps,  Occurring for several weeks, resolved with tonic water.   S/p cholecystectomy,  elective,  By Rochel Brome done bc of recurrent nausea not resolved with stopping Victoza.  Nausea resolved with cholecystectomy.     History of fatty liver, noted during cholecystectomy,  No fibrosis.  Mother and daughter also have it.   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  Heard and felt a tear and had immediate pain .   when the weather is cold he loses strength in lateral part of hand  .     Past Medical History  Diagnosis Date  . Ulcer of foot 08.06.2014    DIABETIC ULCERATIONS ASSOCIATED WITH IRRITATION LATERAL ANKLE LEFT GREATER THAN RIGHT WITH MILD CELLULITIS  . Diabetes mellitus without complication   . Hypertension   . Hyperlipidemia   . Heart disease 2011    Patient has stent for 80% blockage  . GERD (gastroesophageal reflux disease)     Past Surgical History  Procedure Laterality Date  . Cholecystectomy N/A   . Vasectomy      Family History  Problem Relation Age of Onset  . Hyperlipidemia Mother   . Diabetes Mother   . Hyperlipidemia Father   . Diabetes Father   . Heart disease Father   . Hypertension Father     History   Social History  . Marital Status: Married    Spouse Name: N/A    Number of Children: N/A  . Years of Education: N/A   Occupational History  . Not on file.   Social History Main Topics  . Smoking status: Former Research scientist (life sciences)  . Smokeless tobacco: Never Used  . Alcohol Use: No  . Drug Use: No  . Sexual Activity: Not on file   Other Topics Concern  . Not on file   Social History Narrative  . No narrative on file       @ALLHX @    Review of Systems:   The remainder of the review of systems was negative  except those addressed in the HPI.       Objective:  BP 118/74  Pulse 96  Temp(Src) 97.6 F (36.4 C) (Oral)  Resp 18  Ht 5\' 10"  (1.778 m)  Wt 218 lb (98.884 kg)  BMI 31.28 kg/m2  SpO2 98%  General appearance: alert, cooperative and appears stated age Ears: normal TM's and external ear canals both ears Throat: lips, mucosa, and tongue normal; teeth and gums normal Neck: no adenopathy, no carotid bruit, supple, symmetrical, trachea midline and thyroid not enlarged, symmetric, no tenderness/mass/nodules Back: symmetric, no curvature. ROM normal. No CVA tenderness. Lungs: clear to auscultation bilaterally Heart: regular rate and rhythm, S1, S2 normal, no murmur, click, rub or gallop Abdomen: soft,  non-tender; bowel sounds normal; no masses,  no organomegaly Pulses: 2+ and symmetric Skin: Skin color, texture, turgor normal. No rashes or lesions Lymph nodes: Cervical, supraclavicular, and axillary nodes normal. Foot exam:  Nails are well trimmed,  No callouses,  Sensation intact to microfilament  Assessment and Plan:  DIABETES MELLITUS, TYPE II, UNCONTROLLED Very frank discussion with patient regarding his self reported history of noncompliance.  I have offered to work with him as much as possible both face to face and using MyChart to adjust his medications, but will not tolerate noncompliance. Labs ordered,  Glucometer given., patient asked to check sugars a minimum of twice daily . No dose adjustments of insulin until sugars can be revealed.  Foot exam was done today and was normal. Referral to Dr Starling Manns.    Lab Results  Component Value Date   HGBA1C 12.6* 06/10/2013   Lab Results  Component Value Date   NA 137 06/10/2013   K 4.7 06/10/2013   CL 101 06/10/2013   CO2 26 06/10/2013   Lab Results  Component Value Date   MICROALBUR 27.11* 06/10/2013    .  HYPERLIPIDEMIA nonfasting LDL was drawn today and elevated. He has DM and known CAd.  Unclear why he is not taking a statin    . Will recommend generic atorvastatin, and return for fasting lipids.  HYPERTENSION Well controlled on current regimen. Renal function is normal no changes today.  Lab Results  Component Value Date   CREATININE 1.15 06/10/2013     CORONARY ARTERY DISEASE With prior PTCA / Records not available.  Sees Dr. Ubaldo Glassing.    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  Has an appt with Urology to initiate alternative therapy with penile injections  Pain in joint, shoulder region Chronic secondary to prior over extension of shoulder.  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  EMG/[place] studies discussed.    Updated  Medication List Outpatient Encounter Prescriptions as of 06/10/2013  Medication Sig  . BACLOFEN PO Take by mouth as needed.  . gabapentin (NEURONTIN) 300 MG capsule Take 1 capsule (300 mg total) by mouth 3 (three) times daily.  . insulin regular human CONCENTRATED (HUMULIN R) 500 UNIT/ML SOLN injection 20 units in the morning and 25-30 units QHS  . lisinopril-hydrochlorothiazide (PRINZIDE,ZESTORETIC) 10-12.5 MG per tablet Take 2 tablets by mouth daily.  . metFORMIN (GLUCOPHAGE) 1000 MG tablet Take 1,000 mg by mouth 2 (two) times daily with a meal.  . [DISCONTINUED] gabapentin (NEURONTIN) 300 MG capsule Take 1 capsule (300 mg total) by mouth 2 (two) times daily.  . [DISCONTINUED] HUMULIN R 500 UNIT/ML SOLN injection Inject 1 Units into the skin 2 (two) times daily. 20 units in the  morning and 25-30 units QHS  . [DISCONTINUED] atorvastatin (LIPITOR) 20 MG tablet Take 1 tablet (20 mg total) by mouth daily.  . [DISCONTINUED] gabapentin (NEURONTIN) 100 MG capsule Take 300 mg by mouth 2 (two) times daily. 3 pills twice daily

## 2013-06-11 LAB — MICROALBUMIN / CREATININE URINE RATIO
Creatinine, Urine: 535 mg/dL
Microalb Creat Ratio: 50.7 mg/g — ABNORMAL HIGH (ref 0.0–30.0)
Microalb, Ur: 27.11 mg/dL — ABNORMAL HIGH (ref 0.00–1.89)

## 2013-06-11 LAB — FOLATE RBC: RBC Folate: 666 ng/mL (ref >280)

## 2013-06-12 ENCOUNTER — Encounter: Payer: Self-pay | Admitting: Internal Medicine

## 2013-06-12 DIAGNOSIS — M25519 Pain in unspecified shoulder: Secondary | ICD-10-CM | POA: Insufficient documentation

## 2013-06-12 DIAGNOSIS — N529 Male erectile dysfunction, unspecified: Secondary | ICD-10-CM | POA: Insufficient documentation

## 2013-06-12 DIAGNOSIS — E669 Obesity, unspecified: Secondary | ICD-10-CM | POA: Insufficient documentation

## 2013-06-12 MED ORDER — ATORVASTATIN CALCIUM 20 MG PO TABS: 20.0000 mg | ORAL_TABLET | Freq: Every day | ORAL | Status: DC

## 2013-06-12 NOTE — Assessment & Plan Note (Addendum)
Very frank discussion with patient regarding his self reported history of noncompliance.  I have offered to work with him as much as possible both face to face and using MyChart to adjust his medications, but will not tolerate noncompliance. Labs ordered,  Glucometer given., patient asked to check sugars a minimum of twice daily . No dose adjustments of insulin until sugars can be revealed.  Foot exam was done today and was normal. Referral to Dr Starling Manns.    Lab Results  Component Value Date   HGBA1C 12.6* 06/10/2013   Lab Results  Component Value Date   NA 137 06/10/2013   K 4.7 06/10/2013   CL 101 06/10/2013   CO2 26 06/10/2013   Lab Results  Component Value Date   MICROALBUR 27.11* 06/10/2013    .

## 2013-06-12 NOTE — Assessment & Plan Note (Addendum)
nonfasting LDL was drawn today and elevated. He has DM and known CAd.  Unclear why he is not taking a statin    . Will recommend generic atorvastatin, and return for fasting lipids.

## 2013-06-12 NOTE — Assessment & Plan Note (Signed)
Well controlled on current regimen. Renal function is normal no changes today.  Lab Results  Component Value Date   CREATININE 1.15 06/10/2013

## 2013-06-12 NOTE — Assessment & Plan Note (Addendum)
With prior PTCA / Records not available.  Sees Dr. Ubaldo Glassing.

## 2013-06-12 NOTE — Assessment & Plan Note (Signed)
Chronic secondary to prior over extension of shoulder.  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  EMG/[place] studies discussed.

## 2013-06-12 NOTE — Assessment & Plan Note (Signed)
priiorfailed trials of viagra and cialis per patient.  Has an appt with Urology to initiate alternative therapy with penile injections

## 2013-06-13 ENCOUNTER — Telehealth: Payer: Self-pay | Admitting: Internal Medicine

## 2013-06-13 ENCOUNTER — Telehealth: Payer: Self-pay | Admitting: *Deleted

## 2013-06-13 ENCOUNTER — Other Ambulatory Visit: Payer: Self-pay | Admitting: Internal Medicine

## 2013-06-13 ENCOUNTER — Encounter: Payer: Self-pay | Admitting: Emergency Medicine

## 2013-06-13 MED ORDER — MELOXICAM 15 MG PO TABS
15.0000 mg | ORAL_TABLET | Freq: Every day | ORAL | Status: DC
Start: 2013-06-13 — End: 2013-09-15

## 2013-06-13 MED ORDER — GLUCOSE BLOOD VI STRP
1.0000 | ORAL_STRIP | Freq: Three times a day (TID) | Status: DC
Start: 2013-06-13 — End: 2016-05-21

## 2013-06-13 NOTE — Telephone Encounter (Signed)
rx for meloxicam sent one tablet daily . Do not combine with aleve or motrin

## 2013-06-13 NOTE — Telephone Encounter (Signed)
Script sent for test strips.

## 2013-06-13 NOTE — Telephone Encounter (Signed)
The patient did not receive the prescription for an anti- inflammatory medication that the physician stated she would prescribe the patient.

## 2013-06-13 NOTE — Telephone Encounter (Signed)
Sent patient mychart message

## 2013-06-13 NOTE — Addendum Note (Signed)
Addended by: Nanci Pina on: 06/13/2013 04:53 PM   Modules accepted: Orders

## 2013-06-13 NOTE — Telephone Encounter (Signed)
Relevant patient education assigned to patient using Emmi. ° °

## 2013-06-14 ENCOUNTER — Telehealth: Payer: Self-pay | Admitting: *Deleted

## 2013-06-14 ENCOUNTER — Telehealth: Payer: Self-pay

## 2013-06-14 NOTE — Telephone Encounter (Signed)
Pharmacy Note:  Lisinopril-Hctz    What is the right dose? 1x a day or 2x a day ?

## 2013-06-14 NOTE — Telephone Encounter (Signed)
The chart says one tablet daily !!!

## 2013-06-14 NOTE — Telephone Encounter (Signed)
Relevant patient education assigned to patient using Emmi. ° °

## 2013-06-15 MED ORDER — GLUCOSE BLOOD VI STRP: ORAL_STRIP | Status: DC

## 2013-06-15 NOTE — Addendum Note (Signed)
Addended by: Wynonia Lawman E on: 06/15/2013 08:40 AM   Modules accepted: Orders

## 2013-06-16 NOTE — Telephone Encounter (Signed)
Clarified.

## 2013-06-27 ENCOUNTER — Ambulatory Visit: Payer: Self-pay | Admitting: Internal Medicine

## 2013-06-27 ENCOUNTER — Ambulatory Visit (INDEPENDENT_AMBULATORY_CARE_PROVIDER_SITE_OTHER): Payer: 59 | Admitting: Internal Medicine

## 2013-06-27 ENCOUNTER — Telehealth: Payer: Self-pay | Admitting: Internal Medicine

## 2013-06-27 ENCOUNTER — Encounter: Payer: Self-pay | Admitting: Internal Medicine

## 2013-06-27 VITALS — BP 154/102 | HR 80 | Temp 97.6°F | Resp 16 | Wt 232.5 lb

## 2013-06-27 DIAGNOSIS — IMO0001 Reserved for inherently not codable concepts without codable children: Secondary | ICD-10-CM

## 2013-06-27 DIAGNOSIS — R609 Edema, unspecified: Secondary | ICD-10-CM

## 2013-06-27 DIAGNOSIS — E1165 Type 2 diabetes mellitus with hyperglycemia: Secondary | ICD-10-CM

## 2013-06-27 LAB — COMPREHENSIVE METABOLIC PANEL
ALT: 32 U/L (ref 0–53)
AST: 26 U/L (ref 0–37)
Albumin: 4.2 g/dL (ref 3.5–5.2)
Alkaline Phosphatase: 71 U/L (ref 39–117)
BUN: 22 mg/dL (ref 6–23)
CO2: 28 mEq/L (ref 19–32)
Calcium: 9.9 mg/dL (ref 8.4–10.5)
Chloride: 101 mEq/L (ref 96–112)
Creatinine, Ser: 0.9 mg/dL (ref 0.4–1.5)
GFR: 90.32 mL/min (ref >60.00)
Glucose, Bld: 206 mg/dL — ABNORMAL HIGH (ref 70–99)
Potassium: 4.7 mEq/L (ref 3.5–5.1)
Sodium: 136 mEq/L (ref 135–145)
Total Bilirubin: 1.1 mg/dL (ref 0.3–1.2)
Total Protein: 7.5 g/dL (ref 6.0–8.3)

## 2013-06-27 MED ORDER — FUROSEMIDE 20 MG PO TABS: 20.0000 mg | ORAL_TABLET | Freq: Every day | ORAL | Status: DC

## 2013-06-27 MED ORDER — LISINOPRIL 40 MG PO TABS: 40.0000 mg | ORAL_TABLET | Freq: Every day | ORAL | Status: DC

## 2013-06-27 NOTE — Progress Notes (Signed)
Pre-visit discussion using our clinic review tool. No additional management support is needed unless otherwise documented below in the visit note.  

## 2013-06-27 NOTE — Patient Instructions (Signed)
I am starting you on daily furosemide to help with the fluid retention.  Start with 20 mg daily in the morning,  You may increase it to 40 mg after 3 days if you see no difference.  I am also starting you on Onglyza 5 mg daily to reduce your insulin needs   Check with your insurance about coverage for januvia, onglyza or tradjenta. (all are very similar)  Take your evening insulin BEFORE YOUR EVENING MEAL   This is  Dr. Lupita Dawn version of a  "Low GI"  Diet:  It will still lower your blood sugars and allow you to lose 4 to 8  lbs  per month if you follow it carefully.  Your goal with exercise is a minimum of 30 minutes of aerobic exercise 5 days per week (Walking does not count once it becomes easy!)    All of the foods can be found at grocery stores and in bulk at [employer].  The Atkins protein bars and shakes are available in more varieties at Target, [employer] and [place].     7 AM Breakfast:  Choose from the following:  Low carbohydrate Protein  Shakes (I recommend the EAS AdvantEdge "Carb Control" shakes  Or the low carb shakes by Atkins.    2.5 carbs   Arnold's "Sandwhich Thin"toasted  w/ peanut butter (no jelly: about 20 net carbs  "Bagel Thin" with cream cheese and salmon: about 20 carbs   a scrambled egg/bacon/cheese burrito made with Mission's "carb balance" whole wheat tortilla  (about 10 net carbs )   Avoid cereal and bananas, oatmeal and cream of wheat and grits. They are loaded with carbohydrates!   10 AM: high protein snack  Protein bar by Atkins (the snack size, under 200 cal, usually < 6 net carbs).    A stick of cheese:  Around 1 carb,  100 cal     Dannon Light n Fit [place] Yogurt  (80 cal, 8 carbs)  Other so called "protein bars" and Greek yogurts tend to be loaded with carbohydrates.  Remember, in food advertising, the word "energy" is synonymous for " carbohydrate."  Lunch:   A Sandwich using the bread choices listed, Can use any  Eggs,  lunchmeat, grilled meat or  canned tuna), avocado, regular mayo/mustard  and cheese.  A Salad using blue cheese, ranch,  Goddess or vinagrette,  No croutons or "confetti" and no "candied nuts" but regular nuts OK.   No pretzels or chips.  Pickles and miniature sweet peppers are a good low carb alternative that provide a "crunch"  The bread is the only source of carbohydrate in a sandwich and  can be decreased by trying some of these alternatives to traditional loaf bread  Joseph's makes a pita bread and a flat bread that are 50 cal and 4 net carbs available at [place] and [place].  This can be toasted to use with hummous as well  Toufayan makes a low carb flatbread that's 100 cal and 9 net carbs available at [employer] and [employer]'s makes 2 sizes of  Low carb whole wheat tortilla  (The large one is 210 cal and 6 net carbs) Avoid "Low fat dressings, as well as Alvy Brunner and Stella dressings They are loaded with sugar!   3 PM/ Mid day  Snack:  Consider  1 ounce of  almonds, walnuts, pistachios, pecans, peanuts,  Macadamia nuts or a nut medley.  Avoid "granola"; the dried cranberries and raisins  are loaded with carbohydrates. Mixed nuts as long as there are no raisins,  cranberries or dried fruit.     6 PM  Dinner:     Meat/fowl/fish with a green salad, and either broccoli, cauliflower, green beans, spinach, brussel sprouts or  Lima beans. DO NOT BREAD THE PROTEIN!!      There is a low carb pasta by Dreamfield's that is acceptable and tastes great: only 5 digestible carbs/serving.( All grocery stores but BJs carry it )  Try Hurley Cisco Angelo's chicken piccata or chicken or eggplant parm over low carb pasta.(Lowes and BJs)   Marjory Lies Sanchez's "Carnitas" (pulled pork, no sauce,  0 carbs) or his beef pot roast to make a dinner burrito (at [employer]J's)  Pesto over low carb pasta (bj's sells a good quality pesto in the center refrigerated section of the deli   Whole wheat pasta is still full of digestible carbs and  Not as low in  glycemic index as Dreamfield's.   Brown rice is still rice,  So skip the rice and noodles if you eat [place] or [place] (or at least limit to 1/2 cup)  9 PM snack :   Breyer's "low carb" fudgsicle or  ice cream bar (Carb Smart line), or  Weight Watcher's ice cream bar , or another "no sugar added" ice cream;  a serving of fresh berries/cherries with whipped cream   Cheese or DANNON'S LlGHT N FIT [place] YOGURT  Avoid bananas, pineapple, grapes  and watermelon on a regular basis because they are high in sugar.  THINK OF THEM AS DESSERT  Remember that snack Substitutions should be less than 10 NET carbs per serving and meals < 20 carbs. Remember to subtract fiber grams to get the "net carbs."

## 2013-06-27 NOTE — Telephone Encounter (Signed)
Inform patient,  No DVT.Marland Kitchen  Start the furosemide and onglyza as directed today.  I will let hin know if he needs to start potassium too,.

## 2013-06-27 NOTE — Telephone Encounter (Signed)
Left message to return call to office.

## 2013-06-27 NOTE — Telephone Encounter (Signed)
The patient's wife called wanting results from the patient's ultra sound . The patient is unsure of what he is to do next.

## 2013-06-27 NOTE — Telephone Encounter (Signed)
Green called with results of Ultra sound  Negative for DVT was call report please advise?

## 2013-06-27 NOTE — Progress Notes (Signed)
Patient ID: Chase Clark, male   DOB: 03-23-1966, 48 y.o.   MRN: [id number]  Patient Active Problem List   Diagnosis Date Noted  . Edema 06/28/2013  . 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 Obesity (BMI 30-39.9) 06/12/2013  . Pain in joint, shoulder region 06/12/2013  . Statin intolerance 06/10/2013  . Ulcer of foot   . DIABETES MELLITUS, TYPE II, UNCONTROLLED 07/17/2006  . HYPERLIPIDEMIA 07/17/2006  . GILBERT'S SYNDROME 07/17/2006  . HYPERTENSION 07/17/2006  . CORONARY ARTERY DISEASE 07/17/2006  . OSTEOARTHRITIS 07/17/2006  . COUGH 07/17/2006    Subjective:  CC:   Chief Complaint  Patient presents with  . Follow-up    A1c    HPI:   Chase Clark is a 48 y.o. male who presents for follow up on Uncontrolleddiabetes.   Lab Results  Component Value Date   HGBA1C 12.6* 06/10/2013   .    Has developed a reaction to insulin which happened previously with Dr. Eddie Dibbles. . Developed severe swelling of both legs,  Righter greater than left. .The swelling became severe over last week  . He has reduced insuln by 1/3 and the swelling increased but sugars remain elevated.   Using concentrated R 500, 25 at night at bedtime and 15 to 18 in the morning.   Has reduced the morning dose to 12 due to recurrent hypoglycemic events  In the middle of the day and a week ago at 3 am  Eats dinner late around 8 pm  And is usually  In bed by 10:00  Breakfast is usually a boiled egg and a banana     Past Medical History  Diagnosis Date  . Ulcer of foot 08.06.2014    DIABETIC ULCERATIONS ASSOCIATED WITH IRRITATION LATERAL ANKLE LEFT GREATER THAN RIGHT WITH MILD CELLULITIS  . Diabetes mellitus without complication   . Hypertension   . Hyperlipidemia   . Heart disease 2011    Patient has stent for 80% blockage  . GERD (gastroesophageal reflux disease)     Past Surgical History  Procedure Laterality Date  . Cholecystectomy N/A   . Vasectomy         The following portions  of the patient's history were reviewed and updated as appropriate: Allergies, current medications, and problem list.    Review of Systems:   Patient denies headache, fevers, malaise, unintentional weight loss, skin rash, eye pain, sinus congestion and sinus pain, sore throat, dysphagia,  hemoptysis , cough, dyspnea, wheezing, chest pain, palpitations, orthopnea, edema, abdominal pain, nausea, melena, diarrhea, constipation, flank pain, dysuria, hematuria, urinary  Frequency, nocturia, numbness, tingling, seizures,  Focal weakness, Loss of consciousness,  Tremor, insomnia, depression, anxiety, and suicidal ideation.     History   Social History  . Marital Status: Married    Spouse Name: N/A    Number of Children: N/A  . Years of Education: N/A   Occupational History  . Not on file.   Social History Main Topics  . Smoking status: Former Research scientist (life sciences)  . Smokeless tobacco: Never Used  . Alcohol Use: No  . Drug Use: No  . Sexual Activity: Not on file   Other Topics Concern  . Not on file   Social History Narrative  . No narrative on file    Objective:  Filed Vitals:   06/27/13 0902  BP: 154/102  Pulse:   Temp:   Resp:      General appearance: alert, cooperative and appears stated age Ears: normal TM's and  external ear canals both ears Throat: lips, mucosa, and tongue normal; teeth and gums normal Neck: no adenopathy, no carotid bruit, supple, symmetrical, trachea midline and thyroid not enlarged, symmetric, no tenderness/mass/nodules Back: symmetric, no curvature. ROM normal. No CVA tenderness. Lungs: clear to auscultation bilaterally Heart: regular rate and rhythm, S1, S2 normal, no murmur, click, rub or gallop Abdomen: soft, non-tender; bowel sounds normal; no masses,  no organomegaly Pulses: 2+ and symmetric Skin: Skin color, texture, turgor poor due to edema of right leg  No rashes or lesions Lymph nodes: Cervical, supraclavicular, and axillary nodes normal. Foot  exam:  Nails are well trimmed,  No callouses,  Sensation intact to microfilament  Assessment and Plan:  DIABETES MELLITUS, TYPE II, UNCONTROLLED dose adjustments of insulin madew by pateint due to reacive edeam.  Adding onglyza 5 mg daily .  Foot exam was done today and was normal. Referral to Dr Starling Manns.    Lab Results  Component Value Date   HGBA1C 12.6* 06/10/2013   Lab Results  Component Value Date   NA 136 06/27/2013   K 4.7 06/27/2013   CL 101 06/27/2013   CO2 28 06/27/2013   Lab Results  Component Value Date   MICROALBUR 27.11* 06/10/2013    .    Edema Acute lower extremity.  asymmetry warrants rule out of DVT.  ultrasound was normal    Updated Medication List Outpatient Encounter Prescriptions as of 06/27/2013  Medication Sig  . BACLOFEN PO Take by mouth as needed.  . gabapentin (NEURONTIN) 300 MG capsule Take 1 capsule (300 mg total) by mouth 3 (three) times daily.  Marland Kitchen glucose blood (BAYER CONTOUR NEXT TEST) test strip Check blood sugar tid  . glucose blood test strip 1 each by Other route 3 (three) times daily. Use as instructed  . insulin regular human CONCENTRATED (HUMULIN R) 500 UNIT/ML SOLN injection 20 units in the morning and 25-30 units QHS  . meloxicam (MOBIC) 15 MG tablet Take 1 tablet (15 mg total) by mouth daily.  . metFORMIN (GLUCOPHAGE) 1000 MG tablet Take 1,000 mg by mouth 2 (two) times daily with a meal.  . [DISCONTINUED] lisinopril-hydrochlorothiazide (PRINZIDE,ZESTORETIC) 20-25 MG per tablet TAKE 1 TABLET EVERY DAY  . furosemide (LASIX) 20 MG tablet Take 1 tablet (20 mg total) by mouth daily.  Marland Kitchen lisinopril (PRINIVIL,ZESTRIL) 40 MG tablet Take 1 tablet (40 mg total) by mouth daily.     Orders Placed This Encounter  Procedures  . Comp Met (CMET)  . D-Dimer, Quantitative  . Lower Extremity Venous Duplex Right    No Follow-up on file.

## 2013-06-28 ENCOUNTER — Telehealth: Payer: Self-pay | Admitting: Internal Medicine

## 2013-06-28 DIAGNOSIS — R609 Edema, unspecified: Secondary | ICD-10-CM | POA: Insufficient documentation

## 2013-06-28 LAB — D-DIMER, QUANTITATIVE: D-Dimer, Quant: 0.57 ug/mL-FEU — ABNORMAL HIGH (ref 0.00–0.48)

## 2013-06-28 NOTE — Telephone Encounter (Signed)
ultrasoudn was negative for DVT

## 2013-06-28 NOTE — Assessment & Plan Note (Signed)
Acute lower extremity.  asymmetry warrants rule out of DVT.  ultrasound was normal

## 2013-06-28 NOTE — Telephone Encounter (Signed)
Sent mychart message with results

## 2013-06-28 NOTE — Telephone Encounter (Signed)
Patient notified of results and voiced understanding of instructions.

## 2013-06-28 NOTE — Assessment & Plan Note (Signed)
dose adjustments of insulin madew by pateint due to reacive edeam.  Adding onglyza 5 mg daily .  Foot exam was done today and was normal. Referral to Dr Starling Manns.    Lab Results  Component Value Date   HGBA1C 12.6* 06/10/2013   Lab Results  Component Value Date   NA 136 06/27/2013   K 4.7 06/27/2013   CL 101 06/27/2013   CO2 28 06/27/2013   Lab Results  Component Value Date   MICROALBUR 27.11* 06/10/2013    .

## 2013-06-30 NOTE — Telephone Encounter (Signed)
Mailed unread message to pt  

## 2013-07-04 ENCOUNTER — Encounter: Payer: Self-pay | Admitting: Internal Medicine

## 2013-07-05 ENCOUNTER — Encounter: Payer: Self-pay | Admitting: Internal Medicine

## 2013-07-07 ENCOUNTER — Other Ambulatory Visit: Payer: Self-pay | Admitting: Internal Medicine

## 2013-07-07 MED ORDER — ZOLPIDEM TARTRATE 10 MG PO TABS: 10.0000 mg | ORAL_TABLET | Freq: Every evening | ORAL | Status: DC | PRN

## 2013-07-07 MED ORDER — FUROSEMIDE 20 MG PO TABS: 40.0000 mg | ORAL_TABLET | Freq: Every day | ORAL | Status: DC

## 2013-07-07 MED ORDER — ESOMEPRAZOLE MAGNESIUM 40 MG PO CPDR: 40.0000 mg | DELAYED_RELEASE_CAPSULE | Freq: Every day | ORAL | Status: DC

## 2013-07-08 ENCOUNTER — Encounter: Payer: Self-pay | Admitting: Emergency Medicine

## 2013-07-11 ENCOUNTER — Encounter: Payer: Self-pay | Admitting: Podiatry

## 2013-07-11 ENCOUNTER — Ambulatory Visit (INDEPENDENT_AMBULATORY_CARE_PROVIDER_SITE_OTHER): Payer: 59 | Admitting: Podiatry

## 2013-07-11 VITALS — BP 154/100 | HR 88 | Resp 16

## 2013-07-11 DIAGNOSIS — M629 Disorder of muscle, unspecified: Secondary | ICD-10-CM

## 2013-07-11 DIAGNOSIS — M79671 Pain in right foot: Secondary | ICD-10-CM

## 2013-07-11 DIAGNOSIS — R609 Edema, unspecified: Secondary | ICD-10-CM

## 2013-07-11 DIAGNOSIS — R600 Localized edema: Secondary | ICD-10-CM

## 2013-07-11 DIAGNOSIS — M2420 Disorder of ligament, unspecified site: Secondary | ICD-10-CM

## 2013-07-11 NOTE — Progress Notes (Signed)
He presents today for followup of a tear in his plantar fascia of his right foot. States is doing much better than it previously was. He also states that he thinks he felt another tear around his ankle area as he points to the medial aspect of his right ankle. He also states his right ankle swells because of his diabetes.  Objective: Vital signs are stable he is alert and oriented x3. Pulses are palpable right. No pain reproducible to the right lower extremity.  Assessment: Well-healing plantar fasciitis.  Plan: Followup with him on an as-needed basis. I did put him in a compression anklet.

## 2013-07-17 ENCOUNTER — Encounter: Payer: Self-pay | Admitting: Internal Medicine

## 2013-07-18 MED ORDER — FUROSEMIDE 20 MG PO TABS: 40.0000 mg | ORAL_TABLET | Freq: Every day | ORAL | Status: DC

## 2013-07-22 ENCOUNTER — Encounter: Payer: Self-pay | Admitting: Internal Medicine

## 2013-07-22 MED ORDER — SAXAGLIPTIN HCL 5 MG PO TABS
5.0000 mg | ORAL_TABLET | Freq: Every day | ORAL | Status: DC
Start: 2013-07-22 — End: 2013-11-08

## 2013-07-29 ENCOUNTER — Ambulatory Visit (INDEPENDENT_AMBULATORY_CARE_PROVIDER_SITE_OTHER): Payer: 59 | Admitting: Internal Medicine

## 2013-07-29 ENCOUNTER — Encounter: Payer: Self-pay | Admitting: Internal Medicine

## 2013-07-29 VITALS — BP 152/78 | HR 62 | Temp 98.2°F | Resp 16 | Wt 225.2 lb

## 2013-07-29 DIAGNOSIS — M629 Disorder of muscle, unspecified: Secondary | ICD-10-CM

## 2013-07-29 DIAGNOSIS — M2420 Disorder of ligament, unspecified site: Secondary | ICD-10-CM

## 2013-07-29 DIAGNOSIS — IMO0001 Reserved for inherently not codable concepts without codable children: Secondary | ICD-10-CM

## 2013-07-29 DIAGNOSIS — I872 Venous insufficiency (chronic) (peripheral): Secondary | ICD-10-CM

## 2013-07-29 DIAGNOSIS — R609 Edema, unspecified: Secondary | ICD-10-CM

## 2013-07-29 DIAGNOSIS — E1165 Type 2 diabetes mellitus with hyperglycemia: Secondary | ICD-10-CM

## 2013-07-29 DIAGNOSIS — M24273 Disorder of ligament, unspecified ankle: Secondary | ICD-10-CM

## 2013-07-29 MED ORDER — TORSEMIDE 20 MG PO TABS: 20.0000 mg | ORAL_TABLET | Freq: Every day | ORAL | Status: DC

## 2013-07-29 MED ORDER — INSULIN NPH ISOPHANE & REGULAR (70-30) 100 UNIT/ML ~~LOC~~ SUSP: 50.0000 [IU] | Freq: Every day | SUBCUTANEOUS | Status: DC

## 2013-07-29 NOTE — Patient Instructions (Addendum)
Continue 5 units of humulin R at breakfast.  Continue onglyza daily   Change evening insulin  To 50 units of 70/30 insulin instread of plain R 500.  You Can increase dose  by 5 units every 2 to 3 days for goal morning sugar of < 150    Suspend gabapentin for now,   Change the lasix to torsemide (different type of diuretic)  MRI of right ankle when swelling is better, and referral to [place]   Try wearing compression stockings during the day as well ([place] does an exceelent job of measuring legs and fitting) \\Return 2 weeks

## 2013-07-29 NOTE — Progress Notes (Signed)
Patient ID: Chase Clark, male   DOB: November 08, 1965, 48 y.o.   MRN: [id number]    Patient Active Problem List   Diagnosis Date Noted  . Disorder of ligament of ankle 07/31/2013  . Edema 06/28/2013  . 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 Obesity (BMI 30-39.9) 06/12/2013  . Pain in joint, shoulder region 06/12/2013  . Statin intolerance 06/10/2013  . Ulcer of foot   . DIABETES MELLITUS, TYPE II, UNCONTROLLED 07/17/2006  . HYPERLIPIDEMIA 07/17/2006  . GILBERT'S SYNDROME 07/17/2006  . HYPERTENSION 07/17/2006  . CORONARY ARTERY DISEASE 07/17/2006  . OSTEOARTHRITIS 07/17/2006  . COUGH 07/17/2006    Subjective:  CC:   Chief Complaint  Patient presents with  . Follow-up    1 month    HPI:   [name] is a 48 y.o. male who presents for One month follow up on uncontrolled DM Type 2 and fluid retention.  During the interim he developed severe heel pain and was diagnosed by podiatry with another tear in a ligament tear in his heel.  No corrective surgery or therapeutics were offfered,  So he is wanting a second opinion on what to do about his heel .  He has been managing his lower extremity swelling has been persistent despite taking 40 mg lasix in the morning and and additional 20 mg during the day.  He is also taking gabapentin 300 mg daily.   He has been taking 20 units  of Humulin R- 500 at night  And 5 to 7 units  In the AM .  Sugars have been much lower since adding onglyza, but are not at goal yet.  Fasting blood sugars have been > 150  . He has had several highs due to dietary indiscretions.     Past Medical History  Diagnosis Date  . Ulcer of foot 08.06.2014    DIABETIC ULCERATIONS ASSOCIATED WITH IRRITATION LATERAL ANKLE LEFT GREATER THAN RIGHT WITH MILD CELLULITIS  . Diabetes mellitus without complication   . Hypertension   . Hyperlipidemia   . Heart disease 2011    Patient has stent for 80% blockage  . GERD (gastroesophageal reflux disease)      Past Surgical History  Procedure Laterality Date  . Cholecystectomy N/A   . Vasectomy         The following portions of the patient's history were reviewed and updated as appropriate: Allergies, current medications, and problem list.    Review of Systems:   Patient denies headache, fevers, malaise, unintentional weight loss, skin rash, eye pain, sinus congestion and sinus pain, sore throat, dysphagia,  hemoptysis , cough, dyspnea, wheezing, chest pain, palpitations, orthopnea, abdominal pain, nausea, melena, diarrhea, constipation, flank pain, dysuria, hematuria, urinary  Frequency, nocturia, numbness, tingling, seizures,  Focal weakness, Loss of consciousness,  Tremor, insomnia, depression, anxiety, and suicidal ideation.     History   Social History  . Marital Status: Married    Spouse Name: N/A    Number of Children: N/A  . Years of Education: N/A   Occupational History  . Not on file.   Social History Main Topics  . Smoking status: Former Research scientist (life sciences)  . Smokeless tobacco: Never Used  . Alcohol Use: No  . Drug Use: No  . Sexual Activity: Not on file   Other Topics Concern  . Not on file   Social History Narrative  . No narrative on file    Objective:  Filed Vitals:   07/29/13 0806  BP: 152/78  Pulse: 62  Temp: 98.2 F (36.8 C)  Resp: 16     General appearance: alert, cooperative and appears stated age Ears: normal TM's and external ear canals both ears Throat: lips, mucosa, and tongue normal; teeth and gums normal Neck: no adenopathy, no carotid bruit, supple, symmetrical, trachea midline and thyroid not enlarged, symmetric, no tenderness/mass/nodules Back: symmetric, no curvature. ROM normal. No CVA tenderness. Lungs: clear to auscultation bilaterally Heart: regular rate and rhythm, S1, S2 normal, no murmur, click, rub or gallop Abdomen: soft, non-tender; bowel sounds normal; no masses,  no organomegaly Pulses: 2+ and symmetric Skin: Skin color,  texture, turgor normal. No rashes or lesions Lymph nodes: Cervical, supraclavicular, and axillary nodes normal.  Assessment and Plan:  DIABETES MELLITUS, TYPE II, UNCONTROLLED Improved with addition of onglyza, but fasting sugars are elevated due to use of Regular insulin at dinner. Trial of 70/30 insulin at dinner,  Starting with 50 units.   Edema Aggravated by ankle ligament tear  And use of gabapentin.  Will change lasix to torsemide,  Obtain venous ultrasound to rule out venous insufficiency.    Disorder of ligament of ankle Records from podiatry requested,  Referral to Pasty Spillers at [facility] for evaluation    Updated Medication List Outpatient Encounter Prescriptions as of 07/29/2013  Medication Sig  . BACLOFEN PO Take by mouth as needed.  Marland Kitchen esomeprazole (NEXIUM) 40 MG capsule Take 1 capsule (40 mg total) by mouth daily.  Marland Kitchen gabapentin (NEURONTIN) 300 MG capsule Take 1 capsule (300 mg total) by mouth 3 (three) times daily.  Marland Kitchen glucose blood (BAYER CONTOUR NEXT TEST) test strip Check blood sugar tid  . glucose blood test strip 1 each by Other route 3 (three) times daily. Use as instructed  . insulin regular human CONCENTRATED (HUMULIN R) 500 UNIT/ML SOLN injection 20 units in the morning and 25-30 units QHS  . lisinopril (PRINIVIL,ZESTRIL) 40 MG tablet Take 1 tablet (40 mg total) by mouth daily.  . meloxicam (MOBIC) 15 MG tablet Take 1 tablet (15 mg total) by mouth daily.  . metFORMIN (GLUCOPHAGE) 1000 MG tablet Take 1,000 mg by mouth 2 (two) times daily with a meal.  . saxagliptin HCl (ONGLYZA) 5 MG TABS tablet Take 1 tablet (5 mg total) by mouth daily.  Marland Kitchen zolpidem (AMBIEN) 10 MG tablet Take 1 tablet (10 mg total) by mouth at bedtime as needed for sleep.  . [DISCONTINUED] furosemide (LASIX) 20 MG tablet Take 2 tablets (40 mg total) by mouth daily.  . insulin NPH-regular Human (NOVOLIN 70/30) (70-30) 100 UNIT/ML injection Inject 50 Units into the skin daily with supper.  . torsemide  (DEMADEX) 20 MG tablet Take 1 tablet (20 mg total) by mouth daily.     Orders Placed This Encounter  Procedures  . DME Other see comment  . Ambulatory referral to Orthopedic Surgery    No Follow-up on file.

## 2013-07-31 ENCOUNTER — Encounter: Payer: Self-pay | Admitting: Internal Medicine

## 2013-07-31 DIAGNOSIS — M24273 Disorder of ligament, unspecified ankle: Secondary | ICD-10-CM | POA: Insufficient documentation

## 2013-07-31 NOTE — Assessment & Plan Note (Addendum)
Improved with addition of onglyza, but fasting sugars are elevated due to use of Regular insulin at dinner. Trial of 70/30 insulin at dinner,  Starting with 50 units.

## 2013-07-31 NOTE — Assessment & Plan Note (Signed)
Aggravated by ankle ligament tear  And use of gabapentin.  Will change lasix to torsemide,  Obtain venous ultrasound to rule out venous insufficiency.

## 2013-07-31 NOTE — Assessment & Plan Note (Signed)
Records from podiatry requested,  Referral to Pasty Spillers at [facility] for evaluation

## 2013-08-01 ENCOUNTER — Encounter: Payer: Self-pay | Admitting: Emergency Medicine

## 2013-08-04 ENCOUNTER — Encounter: Payer: Self-pay | Admitting: Internal Medicine

## 2013-08-12 ENCOUNTER — Ambulatory Visit (INDEPENDENT_AMBULATORY_CARE_PROVIDER_SITE_OTHER): Payer: 59 | Admitting: Internal Medicine

## 2013-08-12 ENCOUNTER — Encounter: Payer: Self-pay | Admitting: Internal Medicine

## 2013-08-12 VITALS — BP 158/98 | HR 90 | Temp 97.6°F | Resp 16 | Wt 231.5 lb

## 2013-08-12 DIAGNOSIS — E1165 Type 2 diabetes mellitus with hyperglycemia: Principal | ICD-10-CM

## 2013-08-12 DIAGNOSIS — M24273 Disorder of ligament, unspecified ankle: Secondary | ICD-10-CM

## 2013-08-12 DIAGNOSIS — R609 Edema, unspecified: Secondary | ICD-10-CM

## 2013-08-12 DIAGNOSIS — I10 Essential (primary) hypertension: Secondary | ICD-10-CM

## 2013-08-12 DIAGNOSIS — M2420 Disorder of ligament, unspecified site: Secondary | ICD-10-CM

## 2013-08-12 DIAGNOSIS — M629 Disorder of muscle, unspecified: Secondary | ICD-10-CM

## 2013-08-12 DIAGNOSIS — IMO0001 Reserved for inherently not codable concepts without codable children: Secondary | ICD-10-CM

## 2013-08-12 MED ORDER — LOSARTAN POTASSIUM 100 MG PO TABS: 100.0000 mg | ORAL_TABLET | Freq: Every day | ORAL | Status: DC

## 2013-08-12 NOTE — Progress Notes (Signed)
Patient ID: Chase Clark, male   DOB: March 18, 1966, 48 y.o.   MRN: [id number]   Patient Active Problem List   Diagnosis Date Noted  . Disorder of ligament of ankle 07/31/2013  . Edema 06/28/2013  . 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 Obesity (BMI 30-39.9) 06/12/2013  . Pain in joint, shoulder region 06/12/2013  . Statin intolerance 06/10/2013  . Ulcer of foot   . DIABETES MELLITUS, TYPE II, UNCONTROLLED 07/17/2006  . HYPERLIPIDEMIA 07/17/2006  . GILBERT'S SYNDROME 07/17/2006  . HYPERTENSION 07/17/2006  . CORONARY ARTERY DISEASE 07/17/2006  . OSTEOARTHRITIS 07/17/2006  . COUGH 07/17/2006    Subjective:  CC:   Chief Complaint  Patient presents with  . Follow-up    Patient reports edem better but still some retention  . Diabetes    HPI:   Chase Clark is a 48 y.o. male who presents for Follow up on uncontrolled DM, LE edema complicated by ankle strain and hypertension  He has had elevated bp since stopping hctz.  We discussed changing lisinopril to losartan   Saw Deorio for ankle pain and suspected ligament tear.  He recommended surgery which would require 6 weeks of non wt bearing and 3 months of modified ambulation.  vs trial of orthotic> He chose an orthotic  to see if he can make ti December. Wife having surgery next week.   Diuretic finally working.  Edema has improved since stopping the second dose of R500, but his blood sugars are going up.  He is currently taking 5 units of R 500.  Not dropping .  Using 70 units 70/30 before dinner. fastings still 160 or higher  Up to 214.  Feeling better,  Gaining weight    Past Medical History  Diagnosis Date  . Ulcer of foot 08.06.2014    DIABETIC ULCERATIONS ASSOCIATED WITH IRRITATION LATERAL ANKLE LEFT GREATER THAN RIGHT WITH MILD CELLULITIS  . Diabetes mellitus without complication   . Hypertension   . Hyperlipidemia   . Heart disease 2011    Patient has stent for 80% blockage  . GERD (gastroesophageal  reflux disease)     Past Surgical History  Procedure Laterality Date  . Cholecystectomy N/A   . Vasectomy         The following portions of the patient's history were reviewed and updated as appropriate: Allergies, current medications, and problem list.    Review of Systems:   Patient denies headache, fevers, malaise, unintentional weight loss, skin rash, eye pain, sinus congestion and sinus pain, sore throat, dysphagia,  hemoptysis , cough, dyspnea, wheezing, chest pain, palpitations, orthopnea, edema, abdominal pain, nausea, melena, diarrhea, constipation, flank pain, dysuria, hematuria, urinary  Frequency, nocturia, numbness, tingling, seizures,  Focal weakness, Loss of consciousness,  Tremor, insomnia, depression, anxiety, and suicidal ideation.     History   Social History  . Marital Status: Married    Spouse Name: N/A    Number of Children: N/A  . Years of Education: N/A   Occupational History  . Not on file.   Social History Main Topics  . Smoking status: Former Research scientist (life sciences)  . Smokeless tobacco: Never Used  . Alcohol Use: No  . Drug Use: No  . Sexual Activity: Not on file   Other Topics Concern  . Not on file   Social History Narrative  . No narrative on file    Objective:  Filed Vitals:   08/12/13 0823  BP: 158/98  Pulse: 90  Temp: 97.6 F (36.4  C)  Resp: 16     General appearance: alert, cooperative and appears stated age Ears: normal TM's and external ear canals both ears Throat: lips, mucosa, and tongue normal; teeth and gums normal Neck: no adenopathy, no carotid bruit, supple, symmetrical, trachea midline and thyroid not enlarged, symmetric, no tenderness/mass/nodules Back: symmetric, no curvature. ROM normal. No CVA tenderness. Lungs: clear to auscultation bilaterally Heart: regular rate and rhythm, S1, S2 normal, no murmur, click, rub or gallop Abdomen: soft, non-tender; bowel sounds normal; no masses,  no organomegaly Pulses: 2+ and  symmetric Skin: Skin color, texture, turgor normal. No rashes or lesions Lymph nodes: Cervical, supraclavicular, and axillary nodes normal.  Assessment and Plan:  DIABETES MELLITUS, TYPE II, UNCONTROLLED Improved with addition of onglyza, but fasting sugars are elevated due to use of Regular insulin at dinner so  Nighttime insulin was changed to 70/30 insulin starting with 50 units. He is currently taking 70 units,  Suggested continuing to titrate dose up in increments of 5  .     Disorder of ligament of ankle Secondary to rupture of anterior tibialis (?) ligament, suspected.  Saw Deorio at [facility] who recommended surgery vs trial of orthotic , he has chosen the latter for now.   Edema Secondary to use of R 500 bid, now improving with reduction in use and use of diuretic.   HYPERTENSION Elevated since stopping hctz. Since he is on a diuretic,  Will change lisinopril to losartan. Marland Kitchen   Updated Medication List Outpatient Encounter Prescriptions as of 08/12/2013  Medication Sig  . BACLOFEN PO Take by mouth as needed.  Marland Kitchen esomeprazole (NEXIUM) 40 MG capsule Take 1 capsule (40 mg total) by mouth daily.  Marland Kitchen gabapentin (NEURONTIN) 300 MG capsule Take 1 capsule (300 mg total) by mouth 3 (three) times daily.  Marland Kitchen glucose blood (BAYER CONTOUR NEXT TEST) test strip Check blood sugar tid  . glucose blood test strip 1 each by Other route 3 (three) times daily. Use as instructed  . insulin NPH-regular Human (NOVOLIN 70/30) (70-30) 100 UNIT/ML injection Inject 70 Units into the skin daily with supper.  . insulin regular human CONCENTRATED (HUMULIN R) 500 UNIT/ML SOLN injection 5 Units. 5-7 units in the morning  . meloxicam (MOBIC) 15 MG tablet Take 1 tablet (15 mg total) by mouth daily.  . metFORMIN (GLUCOPHAGE) 1000 MG tablet Take 1,000 mg by mouth 2 (two) times daily with a meal.  . saxagliptin HCl (ONGLYZA) 5 MG TABS tablet Take 1 tablet (5 mg total) by mouth daily.  Marland Kitchen torsemide (DEMADEX) 20 MG tablet  Take 1 tablet (20 mg total) by mouth daily.  . [DISCONTINUED] insulin NPH-regular Human (NOVOLIN 70/30) (70-30) 100 UNIT/ML injection Inject 50 Units into the skin daily with supper.  . [DISCONTINUED] insulin regular human CONCENTRATED (HUMULIN R) 500 UNIT/ML SOLN injection 20 units in the morning and 25-30 units QHS  . [DISCONTINUED] lisinopril (PRINIVIL,ZESTRIL) 40 MG tablet Take 1 tablet (40 mg total) by mouth daily.  Marland Kitchen losartan (COZAAR) 100 MG tablet Take 1 tablet (100 mg total) by mouth daily.  Marland Kitchen zolpidem (AMBIEN) 10 MG tablet Take 1 tablet (10 mg total) by mouth at bedtime as needed for sleep.     No orders of the defined types were placed in this encounter.    No Follow-up on file.

## 2013-08-12 NOTE — Progress Notes (Signed)
Pre-visit discussion using our clinic review tool. No additional management support is needed unless otherwise documented below in the visit note.  

## 2013-08-12 NOTE — Patient Instructions (Addendum)
Your sugars are improving.  But continue increasing the 70/30 by 5 units once a week until fasting are < 150 consistently  We are changing the lisinopril to losartan 100 mg daily for the blood pressure   Gaol 130/80 or less  treturn on or after may 27 for labs,.  Follow up appt after that

## 2013-08-14 NOTE — Assessment & Plan Note (Signed)
Improved with addition of onglyza, but fasting sugars are elevated due to use of Regular insulin at dinner so  Nighttime insulin was changed to 70/30 insulin starting with 50 units. He is currently taking 70 units,  Suggested continuing to titrate dose up in increments of 5  .

## 2013-08-14 NOTE — Assessment & Plan Note (Signed)
Secondary to rupture of anterior tibialis (?) ligament, suspected.  Saw Deorio at [facility] who recommended surgery vs trial of orthotic , he has chosen the latter for now.

## 2013-08-14 NOTE — Assessment & Plan Note (Signed)
Elevated since stopping hctz. Since he is on a diuretic,  Will change lisinopril to losartan. Marland Kitchen

## 2013-08-14 NOTE — Assessment & Plan Note (Signed)
Secondary to use of R 500 bid, now improving with reduction in use and use of diuretic.

## 2013-08-26 ENCOUNTER — Telehealth: Payer: Self-pay | Admitting: *Deleted

## 2013-08-26 DIAGNOSIS — E1159 Type 2 diabetes mellitus with other circulatory complications: Secondary | ICD-10-CM

## 2013-08-26 NOTE — Telephone Encounter (Signed)
Pt is coming in on Tuesday what labs and dX?

## 2013-09-08 ENCOUNTER — Other Ambulatory Visit: Payer: 59

## 2013-09-09 ENCOUNTER — Other Ambulatory Visit: Payer: Self-pay | Admitting: Internal Medicine

## 2013-09-09 NOTE — Telephone Encounter (Signed)
Ok to refill,  printed rx  

## 2013-09-09 NOTE — Telephone Encounter (Signed)
Last visit 08/12/13

## 2013-09-12 ENCOUNTER — Ambulatory Visit: Payer: 59 | Admitting: Internal Medicine

## 2013-09-15 ENCOUNTER — Other Ambulatory Visit: Payer: Self-pay | Admitting: Internal Medicine

## 2013-09-15 NOTE — Telephone Encounter (Signed)
Ok to refill,  Refill sent  

## 2013-09-15 NOTE — Telephone Encounter (Signed)
Last visit 08/12/13, refill?

## 2013-09-26 ENCOUNTER — Other Ambulatory Visit: Payer: Self-pay | Admitting: Internal Medicine

## 2013-09-26 NOTE — Telephone Encounter (Signed)
Last OV 5.1.15.  Rx does not appear to have been Rx'd by you.  Please advise refill.

## 2013-09-26 NOTE — Telephone Encounter (Signed)
Ok to refill,  Refill sent  

## 2013-10-25 ENCOUNTER — Telehealth: Payer: Self-pay | Admitting: *Deleted

## 2013-10-25 NOTE — Telephone Encounter (Signed)
Chart reviewed for diabetic bundle. Pt last seen 08/12/13 in office, advised to follow up after 09/07/13 for labs then follow up appointment with Dr. Derrel Nip for HTN and DM. No future appointment scheduled. Sent mychart message on need for lab and follow up appointment.

## 2013-10-27 NOTE — Telephone Encounter (Signed)
Mailed unread message to pt  

## 2013-11-08 ENCOUNTER — Other Ambulatory Visit: Payer: Self-pay | Admitting: Internal Medicine

## 2013-11-15 ENCOUNTER — Other Ambulatory Visit: Payer: Self-pay | Admitting: Internal Medicine

## 2013-11-17 ENCOUNTER — Other Ambulatory Visit: Payer: Self-pay | Admitting: Internal Medicine

## 2013-11-25 ENCOUNTER — Other Ambulatory Visit: Payer: 59

## 2013-11-29 ENCOUNTER — Ambulatory Visit: Payer: 59 | Admitting: Internal Medicine

## 2013-12-02 ENCOUNTER — Ambulatory Visit: Payer: Self-pay | Admitting: Cardiology

## 2013-12-15 LAB — HM DIABETES EYE EXAM

## 2013-12-29 ENCOUNTER — Ambulatory Visit: Payer: Self-pay | Admitting: Ophthalmology

## 2013-12-29 LAB — POTASSIUM: Potassium: 4.5 mmol/L (ref 3.5–5.1)

## 2014-01-01 ENCOUNTER — Other Ambulatory Visit: Payer: Self-pay | Admitting: Internal Medicine

## 2014-01-02 NOTE — Telephone Encounter (Signed)
Last OV 5.1.15, last refill 8.4.15.  Please advise refill

## 2014-01-02 NOTE — Telephone Encounter (Signed)
Ok to refill,  Refill sent  

## 2014-01-06 ENCOUNTER — Other Ambulatory Visit (INDEPENDENT_AMBULATORY_CARE_PROVIDER_SITE_OTHER): Payer: 59

## 2014-01-06 DIAGNOSIS — E1159 Type 2 diabetes mellitus with other circulatory complications: Secondary | ICD-10-CM

## 2014-01-06 LAB — LIPID PANEL
Cholesterol: 247 mg/dL — ABNORMAL HIGH (ref 0–200)
HDL: 36.8 mg/dL — ABNORMAL LOW (ref >39.00)
NonHDL: 210.2
Total CHOL/HDL Ratio: 7
Triglycerides: 520 mg/dL — ABNORMAL HIGH (ref 0.0–149.0)
VLDL: 104 mg/dL — ABNORMAL HIGH (ref 0.0–40.0)

## 2014-01-06 LAB — MICROALBUMIN / CREATININE URINE RATIO
Creatinine,U: 86.9 mg/dL
Microalb Creat Ratio: 15.5 mg/g (ref 0.0–30.0)
Microalb, Ur: 13.5 mg/dL — ABNORMAL HIGH (ref 0.0–1.9)

## 2014-01-06 LAB — COMPREHENSIVE METABOLIC PANEL
ALT: 32 U/L (ref 0–53)
AST: 28 U/L (ref 0–37)
Albumin: 4 g/dL (ref 3.5–5.2)
Alkaline Phosphatase: 91 U/L (ref 39–117)
BUN: 13 mg/dL (ref 6–23)
CO2: 29 mEq/L (ref 19–32)
Calcium: 9.7 mg/dL (ref 8.4–10.5)
Chloride: 99 mEq/L (ref 96–112)
Creatinine, Ser: 1 mg/dL (ref 0.4–1.5)
GFR: 86.92 mL/min (ref >60.00)
Glucose, Bld: 380 mg/dL — ABNORMAL HIGH (ref 70–99)
Potassium: 4.7 mEq/L (ref 3.5–5.1)
Sodium: 133 mEq/L — ABNORMAL LOW (ref 135–145)
Total Bilirubin: 1.5 mg/dL — ABNORMAL HIGH (ref 0.2–1.2)
Total Protein: 7.1 g/dL (ref 6.0–8.3)

## 2014-01-06 LAB — LDL CHOLESTEROL, DIRECT: Direct LDL: 123.2 mg/dL

## 2014-01-06 LAB — HEMOGLOBIN A1C: Hgb A1c MFr Bld: 13 % — ABNORMAL HIGH (ref 4.6–6.5)

## 2014-01-08 ENCOUNTER — Encounter: Payer: Self-pay | Admitting: Internal Medicine

## 2014-01-10 ENCOUNTER — Ambulatory Visit: Payer: Self-pay | Admitting: Ophthalmology

## 2014-01-12 ENCOUNTER — Encounter: Payer: Self-pay | Admitting: Internal Medicine

## 2014-01-12 ENCOUNTER — Ambulatory Visit (INDEPENDENT_AMBULATORY_CARE_PROVIDER_SITE_OTHER): Payer: 59 | Admitting: Internal Medicine

## 2014-01-12 VITALS — BP 126/82 | HR 95 | Temp 98.5°F | Resp 14 | Ht 70.0 in | Wt 210.0 lb

## 2014-01-12 DIAGNOSIS — IMO0002 Reserved for concepts with insufficient information to code with codable children: Secondary | ICD-10-CM

## 2014-01-12 DIAGNOSIS — Z889 Allergy status to unspecified drugs, medicaments and biological substances status: Secondary | ICD-10-CM

## 2014-01-12 DIAGNOSIS — E1165 Type 2 diabetes mellitus with hyperglycemia: Secondary | ICD-10-CM

## 2014-01-12 DIAGNOSIS — E1139 Type 2 diabetes mellitus with other diabetic ophthalmic complication: Secondary | ICD-10-CM

## 2014-01-12 DIAGNOSIS — E1140 Type 2 diabetes mellitus with diabetic neuropathy, unspecified: Secondary | ICD-10-CM

## 2014-01-12 DIAGNOSIS — E1169 Type 2 diabetes mellitus with other specified complication: Secondary | ICD-10-CM

## 2014-01-12 DIAGNOSIS — Z23 Encounter for immunization: Secondary | ICD-10-CM

## 2014-01-12 DIAGNOSIS — Z789 Other specified health status: Secondary | ICD-10-CM

## 2014-01-12 DIAGNOSIS — D649 Anemia, unspecified: Secondary | ICD-10-CM | POA: Insufficient documentation

## 2014-01-12 DIAGNOSIS — I25119 Atherosclerotic heart disease of native coronary artery with unspecified angina pectoris: Secondary | ICD-10-CM

## 2014-01-12 DIAGNOSIS — E785 Hyperlipidemia, unspecified: Secondary | ICD-10-CM

## 2014-01-12 MED ORDER — INSULIN GLARGINE 100 UNIT/ML SOLOSTAR PEN: 30.0000 [IU] | PEN_INJECTOR | Freq: Every day | SUBCUTANEOUS | Status: DC

## 2014-01-12 NOTE — Progress Notes (Signed)
Patient ID: Chase Clark, male   DOB: 1965-12-26, 48 y.o.   MRN: [id number]   Patient Active Problem List   Diagnosis Date Noted  . Anemia 01/12/2014  . Disorder of ligament of ankle 07/31/2013  . Edema 06/28/2013  . 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 Obesity (BMI 30-39.9) 06/12/2013  . Pain in joint, shoulder region 06/12/2013  . Statin intolerance 06/10/2013  . Ulcer of foot   . DM (diabetes mellitus), type 2, uncontrolled w/ophthalmic complication [id number]  . Hyperlipidemia associated with type 2 diabetes mellitus 07/17/2006  . GILBERT'S SYNDROME 07/17/2006  . HYPERTENSION 07/17/2006  . Coronary atherosclerosis 07/17/2006  . OSTEOARTHRITIS 07/17/2006  . COUGH 07/17/2006    Subjective:  CC:   Chief Complaint  Patient presents with  . Follow-up  . Diabetes    HPI:   Chase Clark is a 48 y.o. male who presents for Follow up on uncontrolled DM Type 2.  He is overdue for 3 month follow up.  Since he stopped the humulin 500 due to persistent lower extremity edema he has been  Using 70/30  Insulin 100 units bid and has gradually observe loss of control.  Initially he states that his blood sugars  were averaging around 150 both fasting and post prandially,  But reports that for the past 6 weeks  all sugars  have been very elevated for no apparent reason.  He has not had any steroid injections or recent illness,  He limits his starches to two servings daily.  No candy, soft drinks or sweet tea.  Eats a potato once a week and pasta once a week.   He had right eye cataract extraction with lesn implant by Ronelle Nigh on Sept 29th and his CBS were up to 400 during procedure   He has been fasting since 7 pm yesterday evening and despite taking his usual 100 units of 70/30 last night and again this am.  His fasting sugar this morning was 329 at 6:30 am.  Here in the office it is 259 at 12:30 pm.   Since his last visit he was evaluated with a cardiac  catheterization by [facility], [place]) which noted significant stenosis.  Report was reviewed online from [facility] entry dated.  August 21.  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  Patient plans to avoid surgery.    Past Medical History  Diagnosis Date  . Ulcer of foot 08.06.2014    DIABETIC ULCERATIONS ASSOCIATED WITH IRRITATION LATERAL ANKLE LEFT GREATER THAN RIGHT WITH MILD CELLULITIS  . Diabetes mellitus without complication   . Hypertension   . Hyperlipidemia   . Heart disease 2011    Patient has stent for 80% blockage  . GERD (gastroesophageal reflux disease)     Past Surgical History  Procedure Laterality Date  . Cholecystectomy N/A   . Vasectomy    . Eye surgery Right sept 29n2015    cataract extraction  . Coronary angioplasty with stent placement  2007    1st diagonal       The following portions of the patient's history were reviewed and updated as appropriate: Allergies, current medications, and problem list.    Review of Systems:   Patient denies headache, fevers, malaise, unintentional weight loss, skin rash, eye pain, sinus congestion and sinus pain, sore throat, dysphagia,  hemoptysis ,  cough, dyspnea, wheezing, chest pain, palpitations, orthopnea, edema, abdominal pain, nausea, melena, diarrhea, constipation, flank pain, dysuria, hematuria, urinary  Frequency, nocturia, numbness, tingling, seizures,  Focal weakness, Loss of consciousness,  Tremor, insomnia, depression, anxiety, and suicidal ideation.     History   Social History  . Marital Status: Married    Spouse Name: N/A    Number of Children: N/A  . Years of Education: N/A   Occupational History  . Not on file.   Social History Main Topics  . Smoking status: Former Research scientist (life sciences)  . Smokeless tobacco: Never Used  . Alcohol Use: No  . Drug Use: No  . Sexual  Activity: Not on file   Other Topics Concern  . Not on file   Social History Narrative  . No narrative on file    Objective:  Filed Vitals:   01/12/14 1059  BP: 126/82  Pulse: 95  Temp: 98.5 F (36.9 C)  Resp: 14     General appearance: alert, cooperative and appears stated age Ears: normal TM's and external ear canals both ears Throat: lips, mucosa, and tongue normal; teeth and gums normal Neck: no adenopathy, no carotid bruit, supple, symmetrical, trachea midline and thyroid not enlarged, symmetric, no tenderness/mass/nodules Back: symmetric, no curvature. ROM normal. No CVA tenderness. Lungs: clear to auscultation bilaterally Heart: regular rate and rhythm, S1, S2 normal, no murmur, click, rub or gallop Abdomen: soft, non-tender; bowel sounds normal; no masses,  no organomegaly Pulses: 2+ and symmetric Skin: Skin color, texture, turgor normal. No rashes or lesions Lymph nodes: Cervical, supraclavicular, and axillary nodes normal.  Assessment and Plan:  Coronary atherosclerosis With recent cardiac cath for chest pain,  No hemodynamically  significant  diseaseAugust 21 2015 Fath.  Lipids are uncontrolled without statin, which was stopped due to intolerance.  Trial of low dose rosuvastatin  Need to get DM under control.   Lab Results  Component Value Date   CHOL 247* 01/06/2014   HDL 36.80* 01/06/2014   LDLCALC  Value: 121 (NOTE)  Total Cholesterol/HDL Ratio:CHD Risk                       Coronary Heart Disease Risk Table                                       Men       Women         1/2 Average Risk              3.4        3.3             Average Risk              5.0         4.4         2 X Average Risk              9.6        7.1         3 X Average Risk             23.4       11.0 Use the calculated Patient Ratio above and the CHD Risk table  to determine the patient's CHD Risk. ATP III Classification (LDL):      < 100         mg/dL  Optimal     100 - 129     mg/dL          Near or Above Optimal     130 - 159     mg/dL         Borderline High     160 - 189     mg/dL         High      > 190        mg/dL         Very High* 10/22/2006   LDLDIRECT 123.2 01/06/2014   TRIG 520.0 Triglyceride is over 400; calculations on Lipids are invalid.* 01/06/2014   CHOLHDL 7 01/06/2014     DM (diabetes mellitus), type 2, uncontrolled w/ophthalmic complication Referral to Dr Karolee Stamps advised and accepted.  Patient may require insulin pump vs resuming of R500 which caused significant  LE edema and weight gain due to fluid retention.  Adding 30 units Lantus since he has a large supply at home and appt is next week with Dr Karolee Stamps.   Lab Results  Component Value Date   HGBA1C 13.0* 01/06/2014     Statin intolerance Trial of fenofibrate recommended.  prior intolerance to aortogastric due to muscle pain           Hyperlipidemia associated with type 2 diabetes mellitus He is statin intolerant,  And trigs are over 500 trial of fenofibrate.   A total of 40 minutes was spent with patient more than half of which was spent in counseling patient on the above mentioned issues , reviewing and explaining recent labs , and coordination of care.   Updated Medication List Outpatient Encounter Prescriptions as of 01/12/2014  Medication Sig  . BACLOFEN PO Take by mouth as needed.  Marland Kitchen esomeprazole (NEXIUM) 40 MG capsule TAKE 1 CAPSULE (40 MG TOTAL) BY MOUTH DAILY.  Marland Kitchen gabapentin (NEURONTIN) 300 MG capsule Take 1 capsule (300 mg total) by mouth 3 (three) times daily.  Marland Kitchen glucose blood (BAYER CONTOUR NEXT TEST) test strip Check blood sugar tid  . glucose blood test strip 1 each by Other route 3 (three) times daily. Use as instructed  . hydrochlorothiazide (HYDRODIURIL) 12.5 MG tablet Take 12.5 mg by mouth daily.  . insulin NPH-regular Human (NOVOLIN 70/30) (70-30) 100 UNIT/ML injection Inject 70 Units into the skin daily with supper.  . losartan (COZAAR) 100 MG tablet TAKE 1 TABLET EVERY DAY   . meloxicam (MOBIC) 15 MG tablet TAKE 1 TABLET (15 MG TOTAL) BY MOUTH DAILY.  . metFORMIN (GLUCOPHAGE) 1000 MG tablet TAKE 1 TABLET BY MOUTH TWICE A DAY  . ONGLYZA 5 MG TABS tablet TAKE 1 TABLET (5 MG TOTAL) BY MOUTH DAILY.  Marland Kitchen zolpidem (AMBIEN) 10 MG tablet TAKE 1 TABLET BY MOUTH AT BEDTIME AS NEEDED FOR SLEEP  . fenofibrate (TRICOR) 145 MG tablet Take 1 tablet (145 mg total) by mouth daily.  . Insulin Glargine (LANTUS SOLOSTAR) 100 UNIT/ML Solostar Pen Inject 30 Units into the skin daily.  . [DISCONTINUED] insulin regular human CONCENTRATED (HUMULIN R) 500 UNIT/ML SOLN injection 5 Units. 5-7 units in the morning  . [DISCONTINUED] meloxicam (MOBIC) 15 MG tablet TAKE 1 TABLET (15 MG TOTAL) BY MOUTH DAILY.  . [DISCONTINUED] torsemide (DEMADEX) 20 MG tablet Take 1 tablet (20 mg total) by mouth daily.     Orders Placed This Encounter  Procedures  . Ambulatory referral to Endocrinology  . HM DIABETES EYE EXAM  . HM DIABETES FOOT EXAM  No Follow-up on file.

## 2014-01-12 NOTE — Progress Notes (Signed)
Pre-visit discussion using our clinic review tool. No additional management support is needed unless otherwise documented below in the visit note.  

## 2014-01-12 NOTE — Patient Instructions (Addendum)
I am referring you to Dr Karolee Stamps for help in getting your DM under control  We are adding 30 units of Lantus  Daily to your 70/30 twice daily doses

## 2014-01-14 ENCOUNTER — Telehealth: Payer: Self-pay | Admitting: Internal Medicine

## 2014-01-14 ENCOUNTER — Encounter: Payer: Self-pay | Admitting: Internal Medicine

## 2014-01-14 LAB — HM DIABETES FOOT EXAM: HM Diabetic Foot Exam: NORMAL

## 2014-01-14 MED ORDER — FENOFIBRATE 145 MG PO TABS: 145.0000 mg | ORAL_TABLET | Freq: Every day | ORAL | Status: DC

## 2014-01-14 NOTE — Assessment & Plan Note (Signed)
Trial of fenofibrate recommended.  prior intolerance to aortogastric due to muscle pain

## 2014-01-14 NOTE — Assessment & Plan Note (Signed)
Referral to Dr Karolee Stamps advised and accepted.  Patient may require insulin pump vs resuming of R500 which caused significant  LE edema and weight gain due to fluid retention.  Adding 30 units Lantus since he has a large supply at home and appt is next week with Dr Karolee Stamps.   Lab Results  Component Value Date   HGBA1C 13.0* 01/06/2014

## 2014-01-14 NOTE — Assessment & Plan Note (Signed)
He is statin intolerant,  And trigs are over 500 trial of fenofibrate.

## 2014-01-14 NOTE — Assessment & Plan Note (Addendum)
With recent cardiac cath for chest pain,  No hemodynamically  significant  diseaseAugust 21 2015 Fath.  Lipids are uncontrolled without statin, which was stopped due to intolerance.  Trial of low dose rosuvastatin  Need to get DM under control.   Lab Results  Component Value Date   CHOL 247* 01/06/2014   HDL 36.80* 01/06/2014   LDLCALC  Value: 121 (NOTE)  Total Cholesterol/HDL Ratio:CHD Risk                       Coronary Heart Disease Risk Table                                       Men       Women         1/2 Average Risk              3.4        3.3             Average Risk              5.0         4.4         2 X Average Risk              9.6        7.1         3 X Average Risk             23.4       11.0 Use the calculated Patient Ratio above and the CHD Risk table  to determine the patient's CHD Risk. ATP III Classification (LDL):      < 100         mg/dL         Optimal     100 - 129     mg/dL         Near or Above Optimal     130 - 159     mg/dL         Borderline High     160 - 189     mg/dL         High      > 190        mg/dL         Very High* 10/22/2006   LDLDIRECT 123.2 01/06/2014   TRIG 520.0 Triglyceride is over 400; calculations on Lipids are invalid.* 01/06/2014   CHOLHDL 7 01/06/2014

## 2014-01-19 ENCOUNTER — Telehealth: Payer: Self-pay | Admitting: *Deleted

## 2014-01-19 ENCOUNTER — Ambulatory Visit (INDEPENDENT_AMBULATORY_CARE_PROVIDER_SITE_OTHER): Payer: 59 | Admitting: Endocrinology

## 2014-01-19 VITALS — BP 120/80 | HR 82 | Resp 14 | Ht 70.0 in | Wt 215.2 lb

## 2014-01-19 DIAGNOSIS — E1169 Type 2 diabetes mellitus with other specified complication: Secondary | ICD-10-CM

## 2014-01-19 DIAGNOSIS — IMO0002 Reserved for concepts with insufficient information to code with codable children: Secondary | ICD-10-CM

## 2014-01-19 DIAGNOSIS — E1139 Type 2 diabetes mellitus with other diabetic ophthalmic complication: Secondary | ICD-10-CM

## 2014-01-19 DIAGNOSIS — E785 Hyperlipidemia, unspecified: Secondary | ICD-10-CM

## 2014-01-19 DIAGNOSIS — I10 Essential (primary) hypertension: Secondary | ICD-10-CM

## 2014-01-19 DIAGNOSIS — E1165 Type 2 diabetes mellitus with hyperglycemia: Secondary | ICD-10-CM

## 2014-01-19 MED ORDER — INSULIN NPH ISOPHANE & REGULAR (70-30) 100 UNIT/ML ~~LOC~~ SUSP: 80.0000 [IU] | Freq: Three times a day (TID) | SUBCUTANEOUS | Status: DC

## 2014-01-19 MED ORDER — CANAGLIFLOZIN 100 MG PO TABS: 100.0000 mg | ORAL_TABLET | Freq: Every day | ORAL | Status: DC

## 2014-01-19 NOTE — Assessment & Plan Note (Signed)
BP at goal today. Patient with prior urine MA abnormality- is on ARB.  Now that he is starting Invokana, have asked him to discontinue the HCTZ.  Asked him to stay hydrated.

## 2014-01-19 NOTE — Assessment & Plan Note (Addendum)
His recent A1c is very elevated, recent sugars have shown an improvement.  Discussed about avoiding carbs, sodas and concentrating on eating consistent meals.  Discussed about adding walking for exercise.   Discussed about moving the insulin injection sites around his abdomen- perhaps this will aid in insulin absorption and he may need lower amounts of insulin.   He has large insulin requirements, and U-500 would be the best option, however he has had significant LE edema.  Discussed alternative regimen with TID Novolin 70/30 regimen, basal/bolus regimen with Tuojeo and Novolog , GLP-1 addition, SGLT2 inhibitors, V-GO insulin delivery device. He is not pump ready at this time, but this can be assessed in the future, and he would like to avoid this due to his job.   Continue Metformin, Onglyza for now. At later visits, Onglyza could be discontinued as not sure whether it is helping much.  He prefers to try TID dosing for Novolin 70/30 and have asked him to stop the lantus and change Novolin 70/30 to 80 units three times daily ( BF,L, S) from tomorrow.   Also, he will start the Invokana 100mg  daily and risk profile and side effects were discussed.  Stop low dose HCTZ. Repeat GFR in 2 weeks at time of follow up.   He is aware to check his sugars 3-4 times daily and initially more number of times daily and if he detects any low FS readings, he is aware to notify me promptly.   RTC 2 weeks.

## 2014-01-19 NOTE — Patient Instructions (Addendum)
  Check sugars three - four times daily ( before meals and at bedtime). Move the insulin injection spots.   Stop lantus.  Continue metformin, onglyza, Novolin 70/30.  Change Novolin 70/30 to 80 units three times daily ( with BF, lunch and supper).  Start Invokana 100 mg daily with breakfast. Stay hydrated with water. Stop HCTZ.   Try to limit diet sodas and carbs.  Walking can be added to promote weight loss.   Please come back for a follow-up appointment in 2 weeks

## 2014-01-19 NOTE — Telephone Encounter (Signed)
Message copied by Cecelia Byars on Thu Jan 19, 2014  2:18 PM ------      Message from: Bynum Bellows P      Created: Thu Jan 19, 2014 12:33 PM      Regarding: call pt             I mentioned it to the patient, but forgot to add it on his AVS- please could you remind him to stop the HCTZ.       Notify me if starts to have lows.            thanks ------

## 2014-01-19 NOTE — Telephone Encounter (Signed)
Sent mchart message

## 2014-01-19 NOTE — Assessment & Plan Note (Signed)
Not controlled. Recently started on fenofibrate. Elevated TG levels, expect an improvement with better sugars as well.

## 2014-01-19 NOTE — Progress Notes (Signed)
Reason for visit-  Chase Clark is a 48 y.o.-year-old male, referred by his PCP,  [name], MD for management of Type 2 diabetes, uncontrolled, with complications ( retinopathy, neuropathy and prior DM foot ulcers, microalbuminuria).   HPI- Patient has been diagnosed with diabetes in 1990. Recalls being initially on lifestyle modifications.  Tried  Metformin, Glipizide,Glimeperide, Actos, Onglyza, Victoza. he reports that the oral medications have not been that effective and he has been on insulin and oral medications now.   -Didn't tolerate Humulin R500 due to ankle swelling, weight gain ( 2015). -Victoza  made him sick in his stomach, but this was around the time of GB surgery   Pt is currently on a regimen of: - Metformin 1000 mg po bid - Onglyza 5 mg daily ( since march 2015) - Lantus 35 units qam ( since last week) - Novolin 70/30 at 100 units every morning and 100 units right after dinner Uses syringe for 70/30 and insulin pens for Lantus. Usually uses a small spot on his left side of abdomen for insulin injections since the right abdomen is usually tender for him. Storing the insulin properly.  No recent steroids, or symptoms of Cortisol excess. No prior thyroid dysfunction.    Last hemoglobin A1c was: Lab Results  Component Value Date   HGBA1C 13.0* 01/06/2014   HGBA1C 12.6* 06/10/2013   HGBA1C  Value: 9.9 (NOTE)   The ADA recommends the following therapeutic goals for glycemic   control related to Hgb A1C measurement:   Goal of Therapy:   < 7.0% Hgb A1C   Action Suggested:  > 8.0% Hgb A1C   Ref:  Diabetes Care, 22, Suppl. 1, 1999* 10/22/2006     Pt checks his sugars 3 times a day . Nature conservation officer. By sugar log they are:  PREMEAL Breakfast Lunch Dinner Bedtime Overall  Glucose range: 201-318  102-295 242-310   Mean/median:         Hypoglycemia-  No lows recently. Reports that earlier this year he has several lows with U-500 use; he has hypoglycemia  awareness at 36.   Dietary habits- eats three times daily- smaller BF. Tries to limit carbs, sweetened beverages, sodas, desserts. Carbs are his weak points-pasta and rice. Drinks lot of G2 Gatorade and diet sodas. Exercise- no formal exercise. Works as a Animator.  Weight - fairly stable recently.  Wt Readings from Last 3 Encounters:  01/19/14 215 lb 4 oz (97.637 kg)  01/12/14 210 lb (95.255 kg)  08/12/13 231 lb 8 oz (105.008 kg)    Diabetes Complications-  Nephropathy- No  CKD, last BUN/creatinine- GFR 86, prior microalbuminuria in setting of high sugars Lab Results  Component Value Date   BUN 13 01/06/2014   CREATININE 1.0 01/06/2014   Lab Results  Component Value Date   MICRALBCREAT 15.5 01/06/2014    Retinopathy- Yes, Last DEE was done recently, had right cataract eye surgery about 1 week ago. Then going to go for injections. Legally blind out of right eye. Neuropathy- has numbness and tingling in his feet. Known neuropathy. Is being treated with gabapentin.   Associated history - No CAD . No prior stroke. No hypothyroidism. his last TSH was  Lab Results  Component Value Date   TSH 0.894 06/10/2013    Hyperlipidemia-  his last set of lipids were uncontrolled with elevated TGs- Currently on Fenofibrate 145 mg daily-recently started. Tolerating well.  Statin intolerant. Lab Results  Component Value Date  CHOL 247* 01/06/2014   HDL 36.80* 01/06/2014   LDLCALC  Value: 121 (NOTE)  Total Cholesterol/HDL Ratio:CHD Risk                       Coronary Heart Disease Risk Table                                       Men       Women         1/2 Average Risk              3.4        3.3             Average Risk              5.0         4.4         2 X Average Risk              9.6        7.1         3 X Average Risk             23.4       11.0 Use the calculated Patient Ratio above and the CHD Risk table  to determine the patient's CHD Risk. ATP III Classification (LDL):      < 100          mg/dL         Optimal     100 - 129     mg/dL         Near or Above Optimal     130 - 159     mg/dL         Borderline High     160 - 189     mg/dL         High      > 190        mg/dL         Very High* 10/22/2006   LDLDIRECT 123.2 01/06/2014   TRIG 520.0 Triglyceride is over 400; calculations on Lipids are invalid.* 01/06/2014   CHOLHDL 7 01/06/2014    Blood Pressure/HTN- Patient's blood pressure is well controlled today on current regimen that includes Losartan/ARB and HCTZ.  Pt has FH of DM in parents.  I have reviewed the patient's past medical history, family and social history, surgical history, medications and allergies.  Past Medical History  Diagnosis Date  . Ulcer of foot 08.06.2014    DIABETIC ULCERATIONS ASSOCIATED WITH IRRITATION LATERAL ANKLE LEFT GREATER THAN RIGHT WITH MILD CELLULITIS  . Diabetes mellitus without complication   . Hypertension   . Hyperlipidemia   . Heart disease 2011    Patient has stent for 80% blockage  . GERD (gastroesophageal reflux disease)    Past Surgical History  Procedure Laterality Date  . Cholecystectomy N/A   . Vasectomy    . Eye surgery Right sept 29n2015    cataract extraction  . Coronary angioplasty with stent placement  2007    1st diagonal   History   Social History  . Marital Status: Married    Spouse Name: N/A    Number of Children: N/A  . Years of Education: N/A   Occupational History  . Not on file.   Social History Main Topics  . Smoking  status: Former Research scientist (life sciences)  . Smokeless tobacco: Never Used  . Alcohol Use: No  . Drug Use: No  . Sexual Activity: Not on file   Other Topics Concern  . Not on file   Social History Narrative  . No narrative on file   Current Outpatient Prescriptions on File Prior to Visit  Medication Sig Dispense Refill  . esomeprazole (NEXIUM) 40 MG capsule TAKE 1 CAPSULE (40 MG TOTAL) BY MOUTH DAILY.  30 capsule  5  . glucose blood (BAYER CONTOUR NEXT TEST) test strip Check blood sugar tid   100 each  5  . glucose blood test strip 1 each by Other route 3 (three) times daily. Use as instructed  100 each  12  . losartan (COZAAR) 100 MG tablet TAKE 1 TABLET EVERY DAY  30 tablet  5  . meloxicam (MOBIC) 15 MG tablet TAKE 1 TABLET (15 MG TOTAL) BY MOUTH DAILY.  30 tablet  2  . metFORMIN (GLUCOPHAGE) 1000 MG tablet TAKE 1 TABLET BY MOUTH TWICE A DAY  180 tablet  2  . ONGLYZA 5 MG TABS tablet TAKE 1 TABLET (5 MG TOTAL) BY MOUTH DAILY.  30 tablet  2  . fenofibrate (TRICOR) 145 MG tablet Take 1 tablet (145 mg total) by mouth daily.  90 tablet  1  . zolpidem (AMBIEN) 10 MG tablet TAKE 1 TABLET BY MOUTH AT BEDTIME AS NEEDED FOR SLEEP  30 tablet  1   No current facility-administered medications on file prior to visit.   Allergies  Allergen Reactions  . Atorvastatin     Myalgias   . Codeine Sulfate Nausea Only   Family History  Problem Relation Age of Onset  . Hyperlipidemia Mother   . Diabetes Mother   . Hyperlipidemia Father   . Diabetes Father   . Heart disease Father   . Hypertension Father     Review of Systems: [x]  complains of  [  ] denies General:   [  ] Recent weight change [  ] Fatigue  [  ] Loss of appetite Eyes: [  ]  Vision Difficulty [  ]  Eye pain ENT: [  ]  Hearing difficulty [  ]  Difficulty Swallowing CVS: [  ] Chest pain [  ]  Palpitations/Irregular Heart beat [  ]  Shortness of breath lying flat [  ] Swelling of legs Resp: [  ] Frequent Cough [  ] Shortness of Breath  [  ]  Wheezing GI: [  ] Heartburn  [  ] Nausea or Vomiting  [ x ] Diarrhea [  ] Constipation  [  ] Abdominal Pain GU: [  ]  Polyuria  [  ]  nocturia Bones/joints:  [ x ]  Muscle aches  [ x ] Joint Pain  [  ] Bone pain Skin/Hair/Nails: [  ]  Rash  [  ] New stretch marks [  ]  Itching [  ] Hair loss [  ]  Excessive hair growth Reproduction: [  ] Low sexual desire , [  ]  Women: Menstrual cycle problems [  ]  Women: Breast Discharge [  ] Men: Difficulty with erections [  ]  Men: Enlarged  Breasts CNS: [  ] Frequent Headaches [  ] Blurry vision [  ] Tremors [  ] Seizures [  ] Loss of consciousness [  ] Localized weakness Endocrine: [  ]  Excess thirst [  ]  Feeling excessively hot [  ]  Feeling excessively cold Heme: [  ]  Easy bruising [  ]  Enlarged glands or lumps in neck Allergy: [  ]  Food allergies [  ] Environmental allergies  PE: BP 120/80  Pulse 82  Resp 14  Ht 5\' 10"  (1.778 m)  Wt 215 lb 4 oz (97.637 kg)  BMI 30.89 kg/m2  SpO2 99% Wt Readings from Last 3 Encounters:  01/19/14 215 lb 4 oz (97.637 kg)  01/12/14 210 lb (95.255 kg)  08/12/13 231 lb 8 oz (105.008 kg)   GENERAL: No acute distress, well developed HEENT:  Eye exam shows normal external appearance. Oral exam shows normal mucosa .  NECK:   Neck exam shows no lymphadenopathy. No Carotids bruits. Thyroid is not enlarged and no nodules felt.  no acanthosis nigricans LUNGS:         Chest is symmetrical. Lungs are clear to auscultation.Marland Kitchen   HEART:         Heart sounds:  S1 and S2 are normal. No murmurs or clicks heard. ABDOMEN:  No Distention present. Liver and spleen are not palpable. No other mass or tenderness present. Small area of lipodystrophy over left abdomen EXTREMITIES:     There is no edema. 2+ DP pulses  NEUROLOGICAL:     Grossly intact.            Diabetic foot exam done with shoes and socks removed: patchy loss of Normal Monofilament testing bilaterally. No deformities of toes.  Great toe Nails dystrophic. Skin normal color. No open wounds. Old healed left lateral malleolus ulcer, Dry skin.  MUSCULOSKELETAL:       There is no enlargement or gross deformity of the joints.  SKIN:       No rash  ASSESSMENT AND PLAN: 1. Diabetes mellitus, uncontrolled ( with complications).  2. Hypertension 3. Hyperlipidemia  Problem List Items Addressed This Visit     Cardiovascular and Mediastinum   Essential hypertension     BP at goal today. Patient with prior urine MA abnormality- is on ARB.  Now that  he is starting Invokana, have asked him to discontinue the HCTZ.  Asked him to stay hydrated.      Relevant Medications      aspirin 81 MG tablet     Endocrine   DM (diabetes mellitus), type 2, uncontrolled w/ophthalmic complication - Primary     His recent A1c is very elevated, recent sugars have shown an improvement.  Discussed about avoiding carbs, sodas and concentrating on eating consistent meals.  Discussed about adding walking for exercise.   Discussed about moving the insulin injection sites around his abdomen- perhaps this will aid in insulin absorption and he may need lower amounts of insulin.   He has large insulin requirements, and U-500 would be the best option, however he has had significant LE edema.  Discussed alternative regimen with TID Novolin 70/30 regimen, basal/bolus regimen with Tuojeo and Novolog , GLP-1 addition, SGLT2 inhibitors, V-GO insulin delivery device. He is not pump ready at this time, but this can be assessed in the future, and he would like to avoid this due to his job.   Continue Metformin, Onglyza for now. At later visits, Onglyza could be discontinued as not sure whether it is helping much.  He prefers to try TID dosing for Novolin 70/30 and have asked him to stop the lantus and change Novolin 70/30 to 80 units three times daily ( BF,L, S) from tomorrow.   Also, he will start  the Invokana 100mg  daily and risk profile and side effects were discussed.  Stop low dose HCTZ. Repeat GFR in 2 weeks at time of follow up.   He is aware to check his sugars 3-4 times daily and initially more number of times daily and if he detects any low FS readings, he is aware to notify me promptly.   RTC 2 weeks.     Relevant Medications      aspirin 81 MG tablet      Canagliflozin (INVOKANA) 100 MG TABS      insulin NPH-regular Human (NOVOLIN 70/30) (70-30) 100 UNIT/ML injection     Other   Hyperlipidemia associated with type 2 diabetes mellitus     Not controlled.  Recently started on fenofibrate. Elevated TG levels, expect an improvement with better sugars as well.     Relevant Medications      aspirin 81 MG tablet      Canagliflozin (INVOKANA) 100 MG TABS      insulin NPH-regular Human (NOVOLIN 70/30) (70-30) 100 UNIT/ML injection       - Return to clinic in 2 weeks with sugar log/meter.  PHADKE,RADHIKA [facility] 01/19/2014 12:43 PM

## 2014-01-19 NOTE — Progress Notes (Signed)
Pre visit review using our clinic review tool, if applicable. No additional management support is needed unless otherwise documented below in the visit note. 

## 2014-01-23 ENCOUNTER — Telehealth: Payer: Self-pay | Admitting: *Deleted

## 2014-01-23 NOTE — Telephone Encounter (Signed)
Message copied by Cecelia Byars on Mon Jan 23, 2014 10:27 AM ------      Message from: Haydee Monica      Created: Mon Jan 23, 2014 10:17 AM      Regarding: contact patient please       Please could you call the patient today and check up on how his sugar have been doing- specifically are there any lows? If he could give his log over past 3-4 days that would be great.            Thanks ------

## 2014-01-23 NOTE — Telephone Encounter (Signed)
Sent mychart message

## 2014-01-24 NOTE — Telephone Encounter (Signed)
Left message for pt to return my call. Requested to call back or respond to [employer]

## 2014-01-27 NOTE — Telephone Encounter (Signed)
Left message for pt to return my call. Requested to call back or respond to Chase Clark

## 2014-02-02 ENCOUNTER — Ambulatory Visit: Payer: 59 | Admitting: Endocrinology

## 2014-02-02 ENCOUNTER — Telehealth: Payer: Self-pay

## 2014-02-02 DIAGNOSIS — Z0289 Encounter for other administrative examinations: Secondary | ICD-10-CM

## 2014-02-02 NOTE — Telephone Encounter (Signed)
Called patient per Dr. Howell Rucks request. Patient did not answer. Left a voicemail notifying patient he missed his appointment today and to call the office to reschedule as soon as possible. Will mail patient a letter with same comments.

## 2014-02-09 HISTORY — PX: EYE SURGERY: SHX253

## 2014-02-16 ENCOUNTER — Ambulatory Visit: Payer: Self-pay | Admitting: Gastroenterology

## 2014-02-16 DIAGNOSIS — K2270 Barrett's esophagus without dysplasia: Secondary | ICD-10-CM

## 2014-02-16 HISTORY — DX: Barrett's esophagus without dysplasia: K22.70

## 2014-03-16 ENCOUNTER — Encounter: Payer: Self-pay | Admitting: Internal Medicine

## 2014-03-22 ENCOUNTER — Encounter: Payer: Self-pay | Admitting: *Deleted

## 2014-03-22 ENCOUNTER — Ambulatory Visit: Payer: Self-pay | Admitting: Gastroenterology

## 2014-04-05 ENCOUNTER — Encounter: Payer: Self-pay | Admitting: Internal Medicine

## 2014-04-09 ENCOUNTER — Other Ambulatory Visit: Payer: Self-pay | Admitting: Internal Medicine

## 2014-04-10 ENCOUNTER — Other Ambulatory Visit: Payer: Self-pay | Admitting: *Deleted

## 2014-04-10 MED ORDER — GABAPENTIN 300 MG PO CAPS: 300.0000 mg | ORAL_CAPSULE | Freq: Two times a day (BID) | ORAL | Status: DC

## 2014-04-10 NOTE — Telephone Encounter (Signed)
Refill for gabapentin come in from pharmacy ok to refill

## 2014-04-10 NOTE — Telephone Encounter (Signed)
Refused, patient must call office, and reschedule his missed appt with Dr Howell Rucks. Then I will refill

## 2014-04-10 NOTE — Telephone Encounter (Signed)
Last visit 01/12/14

## 2014-04-11 ENCOUNTER — Other Ambulatory Visit: Payer: Self-pay | Admitting: *Deleted

## 2014-04-11 MED ORDER — GABAPENTIN 300 MG PO CAPS: 300.0000 mg | ORAL_CAPSULE | Freq: Two times a day (BID) | ORAL | Status: DC

## 2014-04-11 NOTE — Telephone Encounter (Signed)
[facility] SENT REFILL FAX REQUEST ON GABAPENTIN 300 MG. PER DR HYATT REFILL #[id number] WITH 3 REFILLS.

## 2014-04-24 ENCOUNTER — Other Ambulatory Visit: Payer: Self-pay | Admitting: Internal Medicine

## 2014-06-05 ENCOUNTER — Other Ambulatory Visit: Payer: Self-pay | Admitting: Internal Medicine

## 2014-06-12 ENCOUNTER — Other Ambulatory Visit: Payer: Self-pay | Admitting: *Deleted

## 2014-06-12 MED ORDER — GABAPENTIN 300 MG PO CAPS: 300.0000 mg | ORAL_CAPSULE | Freq: Two times a day (BID) | ORAL | Status: DC

## 2014-06-12 NOTE — Telephone Encounter (Signed)
Came into office needs a refill on gabapentin has an appointment next week

## 2014-06-19 ENCOUNTER — Ambulatory Visit (INDEPENDENT_AMBULATORY_CARE_PROVIDER_SITE_OTHER): Payer: Commercial Managed Care - PPO | Admitting: Podiatry

## 2014-06-19 VITALS — BP 138/103 | HR 87 | Resp 16

## 2014-06-19 DIAGNOSIS — M629 Disorder of muscle, unspecified: Secondary | ICD-10-CM

## 2014-06-19 NOTE — Progress Notes (Signed)
He presents today with continued pain to the plantar and plantar lateral aspect of the right foot. He states that this foot is just not getting any better. He sought a second opinion from [place] and was referred to [facility]. He wants to follow-up with [place] for another evaluation.  Objective: Vital signs are stable he is alert and oriented 3. I have reviewed his MRI and all of his recent paperwork gathering information regarding his right foot. His history of diabetes with an elevated A1c suggests possible diabetic related issue. He does have a history of peripheral neuropathy with very strong palpable pulses. Evaluation of his right foot does demonstrate what appears to be a dislocated or subluxed fourth fifth met cuboid articulation. He also has pes planus.  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  Plan: I discussed this with him in great detail today suggesting that while he is in good health and young enough to heal I would recommend that he follow-up with Dr.Deorieo at [facility] for surgical reconstruction. He will take this under advisement.

## 2014-06-23 ENCOUNTER — Other Ambulatory Visit: Payer: Self-pay | Admitting: Internal Medicine

## 2014-07-17 ENCOUNTER — Encounter: Payer: Self-pay | Admitting: *Deleted

## 2014-07-17 NOTE — Telephone Encounter (Signed)
Note from Dr. Derrel Nip, pt needs 3 month follow up. Mychart message sent.

## 2014-07-19 ENCOUNTER — Other Ambulatory Visit: Payer: Self-pay | Admitting: Internal Medicine

## 2014-07-19 NOTE — Telephone Encounter (Signed)
Refill? Pt has been notified on need to call office for appt, past due DM f/u

## 2014-07-28 ENCOUNTER — Other Ambulatory Visit: Payer: Self-pay | Admitting: Internal Medicine

## 2014-07-28 ENCOUNTER — Telehealth: Payer: Self-pay | Admitting: Internal Medicine

## 2014-07-28 NOTE — Telephone Encounter (Signed)
[name]'s meloxicam has been denied until he keeps his follow up appt .   He no showed with Dr Howell Rucks for uncontrolled diabetes in October and has not rescheduled.

## 2014-07-28 NOTE — Telephone Encounter (Signed)
Ok to fill 

## 2014-07-31 NOTE — Telephone Encounter (Signed)
Left message for patient to  Return call to office.

## 2014-07-31 NOTE — Telephone Encounter (Signed)
Mailed unread message to pt  

## 2014-08-02 ENCOUNTER — Encounter: Payer: Self-pay | Admitting: *Deleted

## 2014-08-02 NOTE — Telephone Encounter (Signed)
Patient has not returned after several attempts to call patient letter to notify patient of refused refill.

## 2014-08-04 ENCOUNTER — Other Ambulatory Visit: Payer: Self-pay | Admitting: Internal Medicine

## 2014-08-05 NOTE — Op Note (Signed)
PATIENT NAME:  Chase Clark, Chase Clark MR#:  [id number] DATE OF BIRTH:  Jul 26, 1965  DATE OF PROCEDURE:  01/10/2014  PREOPERATIVE DIAGNOSIS: Visually significant cataract of the right eye.   POSTOPERATIVE DIAGNOSIS: Visually significant cataract of the right eye.   OPERATIVE PROCEDURE: Cataract extraction by phacoemulsification with implant of intraocular lens to right eye.   SURGEON: Birder Robson, MD.   ANESTHESIA:  1. Managed anesthesia care.  2. Topical tetracaine drops followed by 2% Xylocaine jelly applied in the preoperative holding area.   COMPLICATIONS: None.   TECHNIQUE:  Stop and chop.  DESCRIPTION OF PROCEDURE: The patient was examined and consented in the preoperative holding area where the aforementioned topical anesthesia was applied to the right eye and then brought back to the Operating Room where the right eye was prepped and draped in the usual sterile ophthalmic fashion and a lid speculum was placed. A paracentesis was created with the side port blade and the anterior chamber was filled with viscoelastic. A near clear corneal incision was performed with the steel keratome. A continuous curvilinear capsulorrhexis was performed with a cystotome followed by the capsulorrhexis forceps. Hydrodissection and hydrodelineation were carried out with BSS on a blunt cannula. The lens was removed in a stop and chop technique and the remaining cortical material was removed with the irrigation-aspiration handpiece. The capsular bag was inflated with viscoelastic and the Tecnis ZCB00 22.5-diopter lens, serial number [id number] was placed in the capsular bag without complication. The remaining viscoelastic was removed from the eye with the irrigation-aspiration handpiece. The wounds were hydrated. The anterior chamber was flushed with Miostat and the eye was inflated to physiologic pressure. 0.1 mL of cefuroxime concentration 10 mg/mL was placed in the anterior chamber. The wounds were found to be  water tight. The eye was dressed with Vigamox. The patient was given protective glasses to wear throughout the day and a shield with which to sleep tonight. The patient was also given drops with which to begin a drop regimen today and will follow-up with me in one day.   ____________________________ Livingston Diones. Porfilio, MD wlp:[id number] D: 01/10/2014 14:06:11 ET T: 01/10/2014 14:47:11 ET JOB#: [id number]  cc: William L. Porfilio, MD, <Dictator> Livingston Diones PORFILIO MD ELECTRONICALLY SIGNED 01/11/2014 11:40

## 2014-08-06 NOTE — Op Note (Signed)
PATIENT NAME:  Chase Clark, Chase Clark MR#:  [id number] DATE OF BIRTH:  1966-01-26  DATE OF PROCEDURE:  07/30/2011  PREOPERATIVE DIAGNOSIS: Chronic acalculous cholecystitis, abnormal echogenicity of the liver.   POSTOPERATIVE DIAGNOSIS: Chronic acalculous cholecystitis, possible fatty liver disease.   PROCEDURE: Laparoscopic cholecystectomy, cholangiogram, and liver biopsy.   SURGEON: Next Rochel Brome, MD  ANESTHESIA: General.   INDICATIONS: This 49 year old male has a history of epigastric discomfort and a six-month history of nausea. He had an abnormally low gallbladder ejection fraction of 11%. Ultrasound had demonstrated some abnormality with echogenicity of the liver, raising suspicion of either fatty liver or cirrhosis. Laparoscopic cholecystectomy and a liver biopsy were recommended for further evaluation and treatment.   DESCRIPTION OF PROCEDURE: The patient was placed on the operating table in the supine position under general endotracheal anesthesia. The abdomen was clipped and prepared with ChloraPrep and draped in a sterile manner.   A short incision was made in the inferior aspect of the umbilicus and carried down to the deep fascia which was grasped with laryngeal hook and elevated. A Veress needle was inserted, aspirated, and irrigated with a saline solution. Next, the peritoneal cavity was inflated with carbon dioxide. The Veress needle was removed. The 10-mm cannula was inserted. The 10-mm, 0-degree laparoscope was inserted to view the peritoneal cavity. The liver had an appearance suggestive of fatty infiltration, although there was no nodularity of the liver as the capsule appeared to be smooth. The gallbladder appeared to have a slightly thickened wall. An incision was made in the epigastrium just to the right of the midline to introduce an 11-mm cannula. Two incisions were made in the lateral aspect of the right upper quadrant to introduce two 5-mm cannulas. The patient was placed in  the reverse Trendelenburg position and tilted several degrees to the left. The gallbladder was retracted towards the right shoulder. A number of adhesions were taken down with blunt dissection. The pouch of Randol Kern was retracted inferiorly and laterally. The porta hepatis was demonstrated. The neck of the gallbladder was dissected. The visceral peritoneum was incised to mobilize the neck of the gallbladder. The cystic duct was dissected free from surrounding structures. The cystic artery was dissected free from surrounding structures. A critical view of safety was demonstrated. An endoclip was placed across the cystic duct adjacent to the neck of the gallbladder. An incision was made in the cystic duct to introduce a Reddick catheter. Half-strength Conray-60 dye was injected as the cholangiogram was done with fluoroscopy, viewing the biliary tree and prompt flow of dye into the duodenum. No retained stones were seen. The Reddick catheter was removed. The cystic duct was doubly ligated with endoclips and divided. The cystic artery was controlled with double endoclips and divided. The gallbladder was dissected free from the liver with blunt and sharp dissection. Bleeding was very minimal. Approximately three-fourths of the way through this dissection attention was turned to do the liver biopsy, making a small stab wound in a subcostal position. The Tru-Cut needle was introduced and obtained several cores of liver and also a small wedge biopsy was sent, all for routine pathology. The site of the wedge biopsy was cauterized and also the needle holes were cauterized. Hemostasis was subsequently intact. Next, the dissection of the gallbladder was continued and completed as the gallbladder was completely separated from the liver and brought out through the infraumbilical incision, opened, suctioned, and removed. There was no grossly palpable stone. The bile was thick. The gallbladder was submitted in  formalin for routine  pathology. The right upper quadrant was further inspected. Hemostasis was intact. The cannulas were removed. Carbon dioxide was allowed to escape from the peritoneal cavity. The fascial defect at the umbilicus was closed with a 0 Vicryl simple suture. The skin incisions were closed with interrupted 5-0 chromic subcuticular sutures, benzoin, and Steri-Strips. Dressings were applied with paper tape. The patient tolerated surgery satisfactorily and was then prepared for transfer to the recovery room.    ____________________________ Lenna Sciara. Rochel Brome, MD jws:bjt D: 07/30/2011 10:46:18 ET T: 07/30/2011 11:05:43 ET JOB#: [id number]  cc: Loreli Dollar, MD, <Dictator> Loreli Dollar MD ELECTRONICALLY SIGNED 07/31/2011 13:03

## 2014-08-07 ENCOUNTER — Other Ambulatory Visit: Payer: Self-pay

## 2014-08-07 LAB — SURGICAL PATHOLOGY

## 2014-08-07 MED ORDER — METFORMIN HCL 1000 MG PO TABS: 1000.0000 mg | ORAL_TABLET | Freq: Two times a day (BID) | ORAL | Status: DC

## 2014-09-15 ENCOUNTER — Other Ambulatory Visit: Payer: Self-pay

## 2014-09-15 MED ORDER — ESOMEPRAZOLE MAGNESIUM 40 MG PO CPDR: 40.0000 mg | DELAYED_RELEASE_CAPSULE | Freq: Every day | ORAL | Status: DC

## 2014-09-15 NOTE — Telephone Encounter (Signed)
90 day supply requested

## 2014-10-09 ENCOUNTER — Other Ambulatory Visit: Payer: Self-pay

## 2014-11-04 ENCOUNTER — Other Ambulatory Visit: Payer: Self-pay | Admitting: Internal Medicine

## 2014-11-06 NOTE — Telephone Encounter (Signed)
Last ov was 01/12/14, please advise refill?

## 2014-11-08 NOTE — Telephone Encounter (Signed)
90 day supply authorized and sent . Please call and schedule 30 minute appointment . For uncontrolled diabetes.   If he refuses  He will have to find another doctor

## 2014-11-08 NOTE — Telephone Encounter (Signed)
Scheduled appointment for 12/20/14.

## 2014-11-30 ENCOUNTER — Other Ambulatory Visit: Payer: Self-pay | Admitting: Internal Medicine

## 2014-11-30 NOTE — Telephone Encounter (Signed)
Refilled Rx for Losartan. Patient has an appointment scheduled with Dr. Derrel Nip on 12/20/14

## 2014-12-01 ENCOUNTER — Other Ambulatory Visit: Payer: Self-pay | Admitting: Internal Medicine

## 2014-12-01 MED ORDER — LOSARTAN POTASSIUM 100 MG PO TABS: 100.0000 mg | ORAL_TABLET | Freq: Every day | ORAL | Status: DC

## 2014-12-20 ENCOUNTER — Ambulatory Visit (INDEPENDENT_AMBULATORY_CARE_PROVIDER_SITE_OTHER): Payer: Self-pay | Admitting: Internal Medicine

## 2014-12-20 ENCOUNTER — Telehealth: Payer: Self-pay | Admitting: Internal Medicine

## 2014-12-20 DIAGNOSIS — E1139 Type 2 diabetes mellitus with other diabetic ophthalmic complication: Secondary | ICD-10-CM

## 2014-12-20 DIAGNOSIS — E1165 Type 2 diabetes mellitus with hyperglycemia: Secondary | ICD-10-CM

## 2014-12-20 NOTE — Progress Notes (Signed)
PATIENT CALLED TO CANCEL HIS APPOINTMENT WITH < 24 HOURS NOTICE,  hE WILL BE CHARGED A NO SHOW FEE

## 2014-12-20 NOTE — Telephone Encounter (Signed)
Please issue a letter of termination for patient due to noncompliance with diabetes regimen

## 2014-12-20 NOTE — Assessment & Plan Note (Signed)
Patient failed to keep scheduled appointment and will be charged a no show fee.   

## 2014-12-20 NOTE — Telephone Encounter (Signed)
Pt called stating he will not be able to make his appt today, Due to a unexpected road trip for his job. Thank You!

## 2014-12-21 ENCOUNTER — Telehealth: Payer: Self-pay

## 2014-12-21 NOTE — Telephone Encounter (Signed)
Patient dismissal process started in HIM 

## 2014-12-21 NOTE — Telephone Encounter (Signed)
Letter completed and left for Dr. To sign.

## 2014-12-22 ENCOUNTER — Telehealth: Payer: Self-pay | Admitting: Internal Medicine

## 2014-12-22 NOTE — Telephone Encounter (Signed)
Patient dismissed from [facility] by Deborra Medina MD , effective December 21, 2014. Dismissal letter sent out by certified / registered mail.  DAJ  Received signed domestic return receipt verifying delivery of certified letter on January 04, 2015. Article number [id number] [place]

## 2014-12-26 NOTE — Telephone Encounter (Signed)
Dismissal process started in HIM

## 2015-01-09 ENCOUNTER — Ambulatory Visit (INDEPENDENT_AMBULATORY_CARE_PROVIDER_SITE_OTHER): Payer: Self-pay | Admitting: Internal Medicine

## 2015-01-09 DIAGNOSIS — E1139 Type 2 diabetes mellitus with other diabetic ophthalmic complication: Secondary | ICD-10-CM

## 2015-01-09 DIAGNOSIS — E1165 Type 2 diabetes mellitus with hyperglycemia: Secondary | ICD-10-CM

## 2015-01-09 NOTE — Progress Notes (Signed)
Patient failed to keep scheduled appointment and will be charged a no show fee.   

## 2015-01-09 NOTE — Assessment & Plan Note (Signed)
Patient failed to keep scheduled appointment and will be charged a no show fee.   

## 2015-02-17 ENCOUNTER — Other Ambulatory Visit: Payer: Self-pay | Admitting: Internal Medicine

## 2015-03-15 ENCOUNTER — Other Ambulatory Visit: Payer: Self-pay | Admitting: Podiatry

## 2015-03-15 MED ORDER — GABAPENTIN 300 MG PO CAPS: 300.0000 mg | ORAL_CAPSULE | Freq: Two times a day (BID) | ORAL | Status: DC

## 2015-03-15 NOTE — Progress Notes (Signed)
Patient called asking for refill. No acute changes. Follow-up with Dr. Milinda Pointer.

## 2015-03-22 ENCOUNTER — Other Ambulatory Visit: Payer: Self-pay | Admitting: Internal Medicine

## 2015-06-11 ENCOUNTER — Other Ambulatory Visit: Payer: Self-pay | Admitting: Internal Medicine

## 2015-09-04 ENCOUNTER — Encounter (HOSPITAL_COMMUNITY): Payer: Self-pay

## 2015-09-04 ENCOUNTER — Emergency Department (HOSPITAL_COMMUNITY): Payer: 59

## 2015-09-04 ENCOUNTER — Emergency Department (HOSPITAL_COMMUNITY)
Admission: EM | Admit: 2015-09-04 | Discharge: 2015-09-05 | Disposition: A | Discharge status: Home / Self Care | Payer: 59 | Attending: Emergency Medicine | Admitting: Emergency Medicine

## 2015-09-04 DIAGNOSIS — I75011 Atheroembolism of right upper extremity: Secondary | ICD-10-CM | POA: Diagnosis not present

## 2015-09-04 DIAGNOSIS — E785 Hyperlipidemia, unspecified: Secondary | ICD-10-CM | POA: Diagnosis not present

## 2015-09-04 DIAGNOSIS — Z87891 Personal history of nicotine dependence: Secondary | ICD-10-CM | POA: Insufficient documentation

## 2015-09-04 DIAGNOSIS — Z794 Long term (current) use of insulin: Secondary | ICD-10-CM | POA: Insufficient documentation

## 2015-09-04 DIAGNOSIS — I742 Embolism and thrombosis of arteries of the upper extremities: Secondary | ICD-10-CM | POA: Diagnosis not present

## 2015-09-04 DIAGNOSIS — Z0130 Encounter for examination of blood pressure without abnormal findings: Secondary | ICD-10-CM

## 2015-09-04 DIAGNOSIS — Z7982 Long term (current) use of aspirin: Secondary | ICD-10-CM | POA: Diagnosis not present

## 2015-09-04 DIAGNOSIS — Z9861 Coronary angioplasty status: Secondary | ICD-10-CM | POA: Insufficient documentation

## 2015-09-04 DIAGNOSIS — K219 Gastro-esophageal reflux disease without esophagitis: Secondary | ICD-10-CM | POA: Diagnosis not present

## 2015-09-04 DIAGNOSIS — Z791 Long term (current) use of non-steroidal anti-inflammatories (NSAID): Secondary | ICD-10-CM | POA: Diagnosis not present

## 2015-09-04 DIAGNOSIS — M25511 Pain in right shoulder: Secondary | ICD-10-CM | POA: Diagnosis not present

## 2015-09-04 DIAGNOSIS — M542 Cervicalgia: Secondary | ICD-10-CM | POA: Diagnosis not present

## 2015-09-04 DIAGNOSIS — I10 Essential (primary) hypertension: Secondary | ICD-10-CM | POA: Insufficient documentation

## 2015-09-04 DIAGNOSIS — E119 Type 2 diabetes mellitus without complications: Secondary | ICD-10-CM | POA: Diagnosis not present

## 2015-09-04 DIAGNOSIS — M546 Pain in thoracic spine: Secondary | ICD-10-CM | POA: Diagnosis not present

## 2015-09-04 DIAGNOSIS — Z872 Personal history of diseases of the skin and subcutaneous tissue: Secondary | ICD-10-CM | POA: Diagnosis not present

## 2015-09-04 DIAGNOSIS — R197 Diarrhea, unspecified: Secondary | ICD-10-CM | POA: Diagnosis not present

## 2015-09-04 DIAGNOSIS — Z8669 Personal history of other diseases of the nervous system and sense organs: Secondary | ICD-10-CM | POA: Diagnosis not present

## 2015-09-04 DIAGNOSIS — Z7984 Long term (current) use of oral hypoglycemic drugs: Secondary | ICD-10-CM | POA: Diagnosis not present

## 2015-09-04 HISTORY — DX: Unspecified glaucoma: H40.9

## 2015-09-04 LAB — CBC WITH DIFFERENTIAL/PLATELET
Basophils Absolute: 0.1 10*3/uL (ref 0.0–0.1)
Basophils Relative: 1 %
Eosinophils Absolute: 0.6 10*3/uL (ref 0.0–0.7)
Eosinophils Relative: 8 %
HCT: 38.9 % — ABNORMAL LOW (ref 39.0–52.0)
Hemoglobin: 13.5 g/dL (ref 13.0–17.0)
Lymphocytes Relative: 32 %
Lymphs Abs: 2.4 10*3/uL (ref 0.7–4.0)
MCH: 31 pg (ref 26.0–34.0)
MCHC: 34.7 g/dL (ref 30.0–36.0)
MCV: 89.4 fL (ref 78.0–100.0)
Monocytes Absolute: 0.4 10*3/uL (ref 0.1–1.0)
Monocytes Relative: 5 %
Neutro Abs: 4 10*3/uL (ref 1.7–7.7)
Neutrophils Relative %: 54 %
Platelets: 178 10*3/uL (ref 150–400)
RBC: 4.35 MIL/uL (ref 4.22–5.81)
RDW: 12.5 % (ref 11.5–15.5)
Smear Review: ADEQUATE
WBC: 7.5 10*3/uL (ref 4.0–10.5)

## 2015-09-04 LAB — COMPREHENSIVE METABOLIC PANEL
ALT: 25 U/L (ref 17–63)
AST: 23 U/L (ref 15–41)
Albumin: 3.4 g/dL — ABNORMAL LOW (ref 3.5–5.0)
Alkaline Phosphatase: 95 U/L (ref 38–126)
Anion gap: 12 (ref 5–15)
BUN: 33 mg/dL — ABNORMAL HIGH (ref 6–20)
CO2: 20 mmol/L — ABNORMAL LOW (ref 22–32)
Calcium: 9.6 mg/dL (ref 8.9–10.3)
Chloride: 95 mmol/L — ABNORMAL LOW (ref 101–111)
Creatinine, Ser: 1.46 mg/dL — ABNORMAL HIGH (ref 0.61–1.24)
GFR calc Af Amer: >60 mL/min (ref >60)
GFR calc non Af Amer: 54 mL/min — ABNORMAL LOW (ref >60)
Glucose, Bld: 420 mg/dL — ABNORMAL HIGH (ref 65–99)
Potassium: 3.8 mmol/L (ref 3.5–5.1)
Sodium: 127 mmol/L — ABNORMAL LOW (ref 135–145)
Total Bilirubin: 0.8 mg/dL (ref 0.3–1.2)
Total Protein: 6.5 g/dL (ref 6.5–8.1)

## 2015-09-04 MED ORDER — IOPAMIDOL (ISOVUE-370) INJECTION 76%
INTRAVENOUS | Status: AC
  Administered 2015-09-05: 80 mL
  Filled 2015-09-04: qty 100

## 2015-09-04 MED ORDER — SODIUM CHLORIDE 0.9 % IV BOLUS (SEPSIS)
1000.0000 mL | Freq: Once | INTRAVENOUS | Status: AC
  Administered 2015-09-05: 1000 mL via INTRAVENOUS

## 2015-09-04 NOTE — ED Notes (Signed)
Pt had procedure done at ophthalmologist  Office today and had different blood pressure reading in each arm. When pt went home wife rechecked bp in bilateral arms and was unable to get a reading in right arm and unable to palpate pulses in right arm. Upon arrival to the ed radial and brachial pulses present via doppler and palpation. Pt denies pain, numbness, tingling in right arm. RUE warm and pink. Pt A&O x 4

## 2015-09-04 NOTE — ED Notes (Signed)
Patient transported to CT 

## 2015-09-04 NOTE — ED Notes (Signed)
Pulses in upper extremities reassessed with no changes

## 2015-09-04 NOTE — ED Provider Notes (Signed)
CSN: [id number]     Arrival date & time 09/04/15  2143 History   First MD Initiated Contact with Patient 09/04/15 2149     No chief complaint on file.  (Consider location/radiation/quality/duration/timing/severity/associated sxs/prior Treatment) HPI Patient is a 50 year old male with past medical history of CAD status post stent, hypertension, hyperlipidemia, diabetes, glaucoma who presents with concern for decreased pulses in his right arm. Patient was seen by the ophthalmologist today and had a facet injection into his left eye. Reportedly patient had a lower blood pressure on his right arm compared with his left arm at the office. He was otherwise asymptomatic without dizziness, chest pain, shortness of breath or orthostasis. Upon return home, patient's wife rechecked his blood pressure in both his arms and was was unable to get a reading in the right arm and unable to palpate pulses in his right wrist. She called the patient's sister who is a pediatric emergency department nurse, who was also unable to palpate pulses in his right arm. Patent was then brought to the emergency department for evaluation. Patient denies pain, numbness or tingling in the right arm. Denies chest pain, shortness of breath or palpitations. Reports mild stomach upset with 2-3 episodes of diarrhea today that was nonbloody. In addition he has been having intermittent problems with neck and upper back pain that he thinks is musculoskeletal. Patient is being followed by orthopedic surgery for an old right shoulder injury and has an MRI scheduled. Denies a history of bleeding or clotting problems.   Past Medical History  Diagnosis Date  . Ulcer of foot ([place]) 08.06.2014    DIABETIC ULCERATIONS ASSOCIATED WITH IRRITATION LATERAL ANKLE LEFT GREATER THAN RIGHT WITH MILD CELLULITIS  . Diabetes mellitus without complication ([place])   . Hypertension   . Hyperlipidemia   . Heart disease 2011    Patient has stent for 80% blockage  .  GERD (gastroesophageal reflux disease)   . Glaucoma    Past Surgical History  Procedure Laterality Date  . Cholecystectomy N/A   . Vasectomy    . Eye surgery Right sept 29n2015    cataract extraction  . Coronary angioplasty with stent placement  2007    1st diagonal   Family History  Problem Relation Age of Onset  . Hyperlipidemia Mother   . Diabetes Mother   . Hyperlipidemia Father   . Diabetes Father   . Heart disease Father   . Hypertension Father    Social History  Substance Use Topics  . Smoking status: Former Research scientist (life sciences)  . Smokeless tobacco: Never Used  . Alcohol Use: No    Review of Systems  Constitutional: Negative for fever and chills.  HENT: Negative for congestion and rhinorrhea.   Eyes:       S/p dilation of left eye and Avastin injection today; same in right eye yesterday. Reports blurry vision that is normal post-injection  Respiratory: Negative for cough and shortness of breath.   Cardiovascular: Negative for chest pain, palpitations and leg swelling.  Gastrointestinal: Positive for diarrhea. Negative for nausea, vomiting and blood in stool.  Genitourinary: Negative for dysuria and difficulty urinating.  Musculoskeletal: Positive for back pain and neck pain.  Skin: Negative for pallor and rash.  Neurological: Negative for dizziness, weakness, light-headedness, numbness and headaches.  Psychiatric/Behavioral: Negative for confusion.      Allergies  Atorvastatin and Codeine sulfate  Home Medications   Prior to Admission medications   Medication Sig Start Date End Date Taking? Authorizing Provider  aspirin 81  MG tablet Take 81 mg by mouth daily.    Historical Provider, MD  Canagliflozin (INVOKANA) 100 MG TABS Take 1 tablet (100 mg total) by mouth daily with breakfast. 01/19/14   Haydee Monica, MD  Cholecalciferol (VITAMIN D) 2000 UNITS tablet Take 2,000 Units by mouth daily.    Historical Provider, MD  esomeprazole (NEXIUM) 40 MG capsule Take 1 capsule  (40 mg total) by mouth daily. 09/15/14   [name], MD  fenofibrate (TRICOR) 145 MG tablet Take 1 tablet (145 mg total) by mouth daily. 01/14/14   [name], MD  gabapentin (NEURONTIN) 300 MG capsule Take 1 capsule (300 mg total) by mouth 2 (two) times daily. 03/15/15   Trula Slade, DPM  glucose blood (BAYER CONTOUR NEXT TEST) test strip Check blood sugar tid 06/15/13   [name], MD  glucose blood test strip 1 each by Other route 3 (three) times daily. Use as instructed 06/13/13   [name], MD  insulin NPH-regular Human (NOVOLIN 70/30) (70-30) 100 UNIT/ML injection Inject 80 Units into the skin 3 (three) times daily before meals. 01/19/14   Radhika P Phadke, MD  losartan (COZAAR) 100 MG tablet TAKE 1 TABLET (100 MG TOTAL) BY MOUTH DAILY. 06/11/15   [name], MD  meloxicam (MOBIC) 15 MG tablet TAKE 1 TABLET (15 MG TOTAL) BY MOUTH DAILY. 04/24/14   [name], MD  metFORMIN (GLUCOPHAGE) 1000 MG tablet TAKE 1 TABLET BY MOUTH TWICE A DAY 11/08/14   [name], MD  methocarbamol (ROBAXIN) 500 MG tablet Take 500 mg by mouth every 8 (eight) hours as needed for muscle spasms.    Historical Provider, MD  Multiple Vitamin (MULTIVITAMIN) tablet Take 1 tablet by mouth daily.    Historical Provider, MD  naproxen sodium (ANAPROX) 220 MG tablet Take 220 mg by mouth as needed.    Historical Provider, MD  ONGLYZA 5 MG TABS tablet TAKE 1 TABLET (5 MG TOTAL) BY MOUTH DAILY.    [name], MD  zolpidem (AMBIEN) 10 MG tablet TAKE 1 TABLET BY MOUTH AT BEDTIME AS NEEDED FOR SLEEP 09/09/13   [name], MD   BP 127/98 mmHg  Pulse 84  Resp 14  Ht 5\' 10"  (1.778 m)  Wt 90.719 kg  BMI 28.70 kg/m2  SpO2 100% Physical Exam  Constitutional: He is oriented to person, place, and time. He appears well-developed and well-nourished.  HENT:  Head: Normocephalic and atraumatic.  Eyes: EOM are normal.  Left pupil is dilated.  Neck: Normal range of motion. Neck supple.  Cardiovascular:  Normal rate, regular rhythm and intact distal pulses.   No murmur heard. Radial pulses are papable 1+ on right, 2+ on left.   Pulmonary/Chest: Effort normal and breath sounds normal. No respiratory distress. He has no wheezes. He has no rales.  Abdominal: Soft. He exhibits no distension. There is no tenderness.  Musculoskeletal: He exhibits no edema.  5/5 strength with flexion/extension of bilateral upper extremities. Right anterior shoulder is TTP. ROM of right shoulder mildly limited secondary to pain. No midline cervical spine TTP. Sensation in RUE is intact.  Neurological: He is alert and oriented to person, place, and time.  Skin: Skin is warm and dry. No rash noted.  Psychiatric: He has a normal mood and affect.  Nursing note and vitals reviewed.   ED Course  Procedures (including critical care time) Labs Review Labs Reviewed  CBC WITH DIFFERENTIAL/PLATELET - Abnormal; Notable for the  following:    HCT 38.9 (*)    All other components within normal limits  COMPREHENSIVE METABOLIC PANEL - Abnormal; Notable for the following:    Sodium 127 (*)    Chloride 95 (*)    CO2 20 (*)    Glucose, Bld 420 (*)    BUN 33 (*)    Creatinine, Ser 1.46 (*)    Albumin 3.4 (*)    GFR calc non Af Amer 54 (*)    All other components within normal limits  BASIC METABOLIC PANEL    Imaging Review Dg Chest 2 View  09/04/2015  CLINICAL DATA:  Evaluate widened mediastinum. EXAM: CHEST  2 VIEW COMPARISON:  03/18/2010 chest CT. FINDINGS: Normal heart size. Mediastinal contour appears stable and within normal limits, with no evidence of mediastinal widening. No pneumothorax. No pleural effusion. Lungs appear clear, with no acute consolidative airspace disease and no pulmonary edema. IMPRESSION: No active cardiopulmonary disease.  Normal mediastinal contour. Electronically Signed   By: Ilona Sorrel M.D.   On: 09/04/2015 22:49   I have personally reviewed and evaluated these images and lab results as part  of my medical decision-making.   EKG Interpretation   Date/Time:  Tuesday Sep 04 2015 22:21:34 EDT Ventricular Rate:  79 PR Interval:  178 QRS Duration: 101 QT Interval:  373 QTC Calculation: 428 R Axis:   72 Text Interpretation:  Sinus rhythm Baseline wander in lead(s) V6 No  significant change was found Confirmed by Wyvonnia Dusky  MD, STEPHEN [phone]) on  09/04/2015 10:44:35 PM      MDM   Final diagnoses:  BP check    Patient is a 50 year old male with past medical history as above who presents with concern for differential pulses in his upper extremities.   On presentation patient is normotensive and has no tachycardia. Exam significant for palpable but decreased right radial pulse compared with left. Patient has no pain in his right upper extremity and it is otherwise neurologically intact. No swelling, TTP, cyanosis, or erythema. BP in his right upper extremity was 127/98 and in his left upper extremity was 147/96. Patient has been having chronic right shoulder pain and is following up with orthopedic surgery to get an MRI. He also reports recent intermittent neck and upper back discomfort. Patient currently denies chest pain, back pain or shortness of breath.   EKG normal sinus rhythm without ischemic changes. CBC is unremarkable. CMP significant for hyperglycemia and elevated BUN/creatinine without acidosis. CTA of the chest with right upper extremity runoff ordered to r/o dissection or arterial thrombus. IV fluid bolus given for hyperglycemia. BMP rechecked following CT scan and hyperglycemia improved but bicarb decreased. No anion gap. Additional IV fluids given.  Care of this patient assumed by Dr. Kathrynn Humble at 0330 am. Please refer to his note for final CTA results and disposition of this patient.   This patient was seen and discussed with Dr. Wyvonnia Dusky, ED attending.       Gibson Ramp, MD 09/05/15 [place], MD 09/05/15 1228

## 2015-09-05 ENCOUNTER — Telehealth: Payer: Self-pay | Admitting: Vascular Surgery

## 2015-09-05 ENCOUNTER — Encounter: Payer: Self-pay | Admitting: *Deleted

## 2015-09-05 ENCOUNTER — Emergency Department (HOSPITAL_COMMUNITY): Payer: 59

## 2015-09-05 ENCOUNTER — Other Ambulatory Visit: Payer: Self-pay | Admitting: *Deleted

## 2015-09-05 DIAGNOSIS — K219 Gastro-esophageal reflux disease without esophagitis: Secondary | ICD-10-CM | POA: Diagnosis not present

## 2015-09-05 DIAGNOSIS — M25519 Pain in unspecified shoulder: Secondary | ICD-10-CM | POA: Diagnosis not present

## 2015-09-05 DIAGNOSIS — Z87891 Personal history of nicotine dependence: Secondary | ICD-10-CM | POA: Diagnosis not present

## 2015-09-05 DIAGNOSIS — D649 Anemia, unspecified: Secondary | ICD-10-CM | POA: Diagnosis not present

## 2015-09-05 DIAGNOSIS — Z9852 Vasectomy status: Secondary | ICD-10-CM | POA: Diagnosis not present

## 2015-09-05 DIAGNOSIS — E113591 Type 2 diabetes mellitus with proliferative diabetic retinopathy without macular edema, right eye: Secondary | ICD-10-CM | POA: Diagnosis not present

## 2015-09-05 DIAGNOSIS — I10 Essential (primary) hypertension: Secondary | ICD-10-CM | POA: Diagnosis not present

## 2015-09-05 DIAGNOSIS — M1990 Unspecified osteoarthritis, unspecified site: Secondary | ICD-10-CM | POA: Diagnosis not present

## 2015-09-05 DIAGNOSIS — Z9849 Cataract extraction status, unspecified eye: Secondary | ICD-10-CM | POA: Diagnosis not present

## 2015-09-05 DIAGNOSIS — I75011 Atheroembolism of right upper extremity: Secondary | ICD-10-CM | POA: Diagnosis not present

## 2015-09-05 DIAGNOSIS — Z955 Presence of coronary angioplasty implant and graft: Secondary | ICD-10-CM | POA: Diagnosis not present

## 2015-09-05 DIAGNOSIS — I771 Stricture of artery: Secondary | ICD-10-CM

## 2015-09-05 DIAGNOSIS — H4089 Other specified glaucoma: Secondary | ICD-10-CM | POA: Diagnosis present

## 2015-09-05 DIAGNOSIS — I2510 Atherosclerotic heart disease of native coronary artery without angina pectoris: Secondary | ICD-10-CM | POA: Diagnosis not present

## 2015-09-05 DIAGNOSIS — E1140 Type 2 diabetes mellitus with diabetic neuropathy, unspecified: Secondary | ICD-10-CM | POA: Diagnosis not present

## 2015-09-05 DIAGNOSIS — Z9049 Acquired absence of other specified parts of digestive tract: Secondary | ICD-10-CM | POA: Diagnosis not present

## 2015-09-05 DIAGNOSIS — K760 Fatty (change of) liver, not elsewhere classified: Secondary | ICD-10-CM | POA: Diagnosis not present

## 2015-09-05 LAB — URINALYSIS, ROUTINE W REFLEX MICROSCOPIC
Bilirubin Urine: NEGATIVE
Glucose, UA: >1000 mg/dL — AB
Hgb urine dipstick: NEGATIVE
Ketones, ur: NEGATIVE mg/dL
Leukocytes, UA: NEGATIVE
Nitrite: NEGATIVE
Protein, ur: NEGATIVE mg/dL
Specific Gravity, Urine: 1.015 (ref 1.005–1.030)
pH: 7.5 (ref 5.0–8.0)

## 2015-09-05 LAB — BASIC METABOLIC PANEL
Anion gap: 10 (ref 5–15)
BUN: 30 mg/dL — ABNORMAL HIGH (ref 6–20)
CO2: 17 mmol/L — ABNORMAL LOW (ref 22–32)
Calcium: 8.5 mg/dL — ABNORMAL LOW (ref 8.9–10.3)
Chloride: 100 mmol/L — ABNORMAL LOW (ref 101–111)
Creatinine, Ser: 1.22 mg/dL (ref 0.61–1.24)
GFR calc Af Amer: >60 mL/min (ref >60)
GFR calc non Af Amer: >60 mL/min (ref >60)
Glucose, Bld: 375 mg/dL — ABNORMAL HIGH (ref 65–99)
Potassium: 4.2 mmol/L (ref 3.5–5.1)
Sodium: 127 mmol/L — ABNORMAL LOW (ref 135–145)

## 2015-09-05 LAB — URINE MICROSCOPIC-ADD ON

## 2015-09-05 LAB — I-STAT CG4 LACTIC ACID, ED: Lactic Acid, Venous: 0.73 mmol/L (ref 0.5–2.0)

## 2015-09-05 MED ORDER — ASPIRIN EC 325 MG PO TBEC: 325.0000 mg | DELAYED_RELEASE_TABLET | Freq: Every day | ORAL | Status: DC

## 2015-09-05 MED ORDER — SODIUM CHLORIDE 0.9 % IV BOLUS (SEPSIS)
1000.0000 mL | Freq: Once | INTRAVENOUS | Status: AC
  Administered 2015-09-05: 1000 mL via INTRAVENOUS

## 2015-09-05 NOTE — Discharge Instructions (Signed)
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 Start taking aspirin. Vascular team will contact you for an office follow up, and they will be ordering the ultrasound from the office.  Return to the ER if you have severe arm pain or skin discoloration.

## 2015-09-05 NOTE — Telephone Encounter (Signed)
Sched lab for 6/14 at 4 and MD 6/21 at 12:45. Lm on hm# to inform pt.

## 2015-09-05 NOTE — ED Notes (Signed)
Vascular surgeon at bedside.

## 2015-09-05 NOTE — ED Notes (Addendum)
Contacted CT for angio studies; Pt is third in line

## 2015-09-05 NOTE — Consult Note (Signed)
[facility] [place]  Patient name: Chase Clark MRN: [id number] DOB: 07-13-1965 Sex: male  REASON FOR CONSULT: Right axillary artery stenosis. Consult is from Dr. Kathrynn Humble  HPI: Chase Clark is a 50 y.o. male, who was having some injections to his eyes by his ophthalmologist  At [facility]. It was noted that he had a low blood pressure in the left arm. At home his family member who is a nurse took the blood pressure in the right arm and was unable to obtain a blood pressure in the right arm. The patient presented to the emergency department for this reason.  He is right-handed. He denies any acute onset pain or paresthesias in the right upper extremity. He has had some mildweakness in the right upper extremity for several months which has been gradually worse over the last month. He has no history of atrial fibrillation or recent myocardial infarction to suggest a source of embolization.  He doesn't fair amount of lifting at his work and has used crutches in the remote past. He denies any significant problems with dizziness.  His risk factors for peripheral vascular disease include diabetes, hypertension, hyperlipidemia, a family history of premature cardiovascular disease, and a remote history of tobacco use.  Past Medical History  Diagnosis Date  . Ulcer of foot ([place]) 08.06.2014    DIABETIC ULCERATIONS ASSOCIATED WITH IRRITATION LATERAL ANKLE LEFT GREATER THAN RIGHT WITH MILD CELLULITIS  . Diabetes mellitus without complication ([place])   . Hypertension   . Hyperlipidemia   . Heart disease 2011    Patient has stent for 80% blockage  . GERD (gastroesophageal reflux disease)   . Glaucoma     Family History  Problem Relation Age of Onset  . Hyperlipidemia Mother   . Diabetes Mother   . Hyperlipidemia Father   . Diabetes Father   . Heart disease Father   . Hypertension Father     SOCIAL HISTORY: He quit tobacco 7 years ago. He had  smoked 1 pack per day off and on for 20 years.  Social History   Social History  . Marital Status: Married    Spouse Name: N/A  . Number of Children: N/A  . Years of Education: N/A   Occupational History  . Not on file.   Social History Main Topics  . Smoking status: Former Research scientist (life sciences)  . Smokeless tobacco: Never Used  . Alcohol Use: No  . Drug Use: No  . Sexual Activity: Not on file   Other Topics Concern  . Not on file   Social History Narrative    Allergies  Allergen Reactions  . Atorvastatin     Myalgias   . Codeine Sulfate Nausea Only    No current facility-administered medications for this encounter.   Current Outpatient Prescriptions  Medication Sig Dispense Refill  . aspirin 81 MG tablet Take 81 mg by mouth daily.    Marland Kitchen esomeprazole (NEXIUM) 40 MG capsule Take 1 capsule (40 mg total) by mouth daily. 90 capsule 0  . gabapentin (NEURONTIN) 300 MG capsule Take 1 capsule (300 mg total) by mouth 2 (two) times daily. (Patient taking differently: Take 600 mg by mouth 2 (two) times daily. ) 180 capsule 3  . glucosamine-chondroitin 500-400 MG tablet Take 1 tablet by mouth daily.    . insulin glargine (LANTUS) 100 UNIT/ML injection Inject 90 Units into the skin 2 (two) times daily.    . insulin lispro (HUMALOG) 100 UNIT/ML injection Inject 60  Units into the skin 2 (two) times daily.    Marland Kitchen lisinopril (PRINIVIL,ZESTRIL) 20 MG tablet Take 20 mg by mouth daily.  0  . losartan-hydrochlorothiazide (HYZAAR) 100-25 MG tablet Take 1 tablet by mouth daily.  11  . metFORMIN (GLUCOPHAGE) 1000 MG tablet TAKE 1 TABLET BY MOUTH TWICE A DAY 180 tablet 0  . Multiple Vitamin (MULTIVITAMIN) tablet Take 1 tablet by mouth daily.    . Canagliflozin (INVOKANA) 100 MG TABS Take 1 tablet (100 mg total) by mouth daily with breakfast. (Patient not taking: Reported on 09/05/2015) 30 tablet 6  . fenofibrate (TRICOR) 145 MG tablet Take 1 tablet (145 mg total) by mouth daily. (Patient not taking: Reported on  09/05/2015) 90 tablet 1  . glucose blood (BAYER CONTOUR NEXT TEST) test strip Check blood sugar tid 100 each 5  . glucose blood test strip 1 each by Other route 3 (three) times daily. Use as instructed 100 each 12  . insulin NPH-regular Human (NOVOLIN 70/30) (70-30) 100 UNIT/ML injection Inject 80 Units into the skin 3 (three) times daily before meals. (Patient not taking: Reported on 09/05/2015) 70 mL 3  . losartan (COZAAR) 100 MG tablet TAKE 1 TABLET (100 MG TOTAL) BY MOUTH DAILY. (Patient not taking: Reported on 09/05/2015) 90 tablet 0  . meloxicam (MOBIC) 15 MG tablet TAKE 1 TABLET (15 MG TOTAL) BY MOUTH DAILY. (Patient not taking: Reported on 09/05/2015) 30 tablet 2  . ONGLYZA 5 MG TABS tablet TAKE 1 TABLET (5 MG TOTAL) BY MOUTH DAILY. (Patient not taking: Reported on 09/05/2015) 30 tablet 2  . zolpidem (AMBIEN) 10 MG tablet TAKE 1 TABLET BY MOUTH AT BEDTIME AS NEEDED FOR SLEEP (Patient not taking: Reported on 09/05/2015) 30 tablet 1    REVIEW OF SYSTEMS:  [X]  denotes positive finding, [ ]  denotes negative finding Cardiac  Comments:  Chest pain or chest pressure:    Shortness of breath upon exertion:    Short of breath when lying flat:    Irregular heart rhythm:        Vascular    Pain in calf, thigh, or hip brought on by ambulation:    Pain in feet at night that wakes you up from your sleep:     Blood clot in your veins:    Leg swelling:         Pulmonary    Oxygen at home:    Productive cough:     Wheezing:         Neurologic    Sudden weakness in arms or legs:     Sudden numbness in arms or legs:     Sudden onset of difficulty speaking or slurred speech:    Temporary loss of vision in one eye:     Problems with dizziness:         Gastrointestinal    Blood in stool:     Vomited blood:         Genitourinary    Burning when urinating:     Blood in urine:        Psychiatric    Major depression:         Hematologic    Bleeding problems:    Problems with blood clotting  too easily:        Skin    Rashes or ulcers:        Constitutional    Fever or chills:      PHYSICAL EXAM: Filed Vitals:   09/05/15 0215 09/05/15 0245  09/05/15 0330 09/05/15 0500  BP: 112/78 114/82 107/70 103/73  Pulse: 73 76 73 73  Resp: 12 12 13 14   Height:      Weight:      SpO2: 96% 98% 96% 94%    GENERAL: The patient is a well-nourished male, in no acute distress. The vital signs are documented above. CARDIAC: There is a regular rate and rhythm.  VASCULAR: I do not detect carotid bruits or supraclavicular bruits. On the right upper extremity, which is the side of concern,  I cannot palpate a radial pulse. He does have a monophasic radial and ulnar signal with the Doppler. The right hand is warm and well perfused. On the left upper extremity, he has a palpable radial pulse. He has palpable femoral, popliteal, and dorsalis pedis pulses bilaterally. He has no significant lower extremity swelling. PULMONARY: There is good air exchange bilaterally without wheezing or rales. ABDOMEN: Soft and non-tender with normal pitched bowel sounds.  MUSCULOSKELETAL: There are no major deformities or cyanosis. NEUROLOGIC: No focal weakness or paresthesias are detected. SKIN: There are no ulcers or rashes noted. PSYCHIATRIC: The patient has a normal affect.  DATA:   CT ANGIO RIGHT UPPER EXTREMITY: I have reviewed the images from his CT scan. Based on the quality of the images, it is difficult to say that the narrowing in the axillary artery is laminated thrombus. I think that this could also represent atherosclerotic plaque. There is no aneurysm  Which would be the most likely reason to have laminated thrombus. It is not focal to suggest that it was an embolic event and he has no good reason to have embolized.   MEDICAL ISSUES:  NARROWING RIGHT AXILLARY ARTERY: Based on my review of the CT scan, I think the narrowing and the axillary artery is more likely atherosclerotic plaque and not clot.  There is no aneurysm and this would be the most likely reason to have laminated thrombus. The narrowing is over a fairly long length anddoes not appear to be an abrupt occlusion which one would see with embolic events. In addition he has no good reason to have embolized. He has multiple risk factors for vascular disease, and although this is an unusual location for atherosclerotic disease, certainly this is possible.  Regardless,  He has not had any acute symptoms. His occasional weakness in the right arm could potentially be related to this however again this is not an acute finding. The right hand is warm and well-perfused.  I have instructed him to always have his blood pressure taken in the left arm. I will see him in the office with a duplex of the artery which may give better visualization of the right axillary artery.  If his weakness in the arm progresses then we could consider arteriography and possible revascularization, depending upon the results of the duplex. hhe is on aspirin. He is not on a statin as he has a statin allergy. He is not a smoker. I will arrange for an office visit and duplex of his right upper extremity at that time.  Deitra Mayo [facility] [place] [phone]

## 2015-09-05 NOTE — ED Provider Notes (Signed)
  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  Pt reassessed - has no symptoms at rest, but does indicate that he has some weakness and discomfort on the R side when he is doing some activity. I spoke with Dr. Scot Dock, Vascular Surgery - who will come and see the patient and decide on the next best step. Pt informed.     Varney Biles, MD 09/05/15 [phone]

## 2015-09-05 NOTE — ED Notes (Signed)
Pt taken to CT.

## 2015-09-05 NOTE — Telephone Encounter (Signed)
-----   Message from Mena Goes, RN sent at 09/05/2015 10:49 AM EDT ----- Regarding: schedule   ----- Message -----    From: Angelia Mould, MD    Sent: 09/05/2015   5:54 AM      To: Vvs Charge Pool Subject: charge and office visit                        Level 4 consult I will need to see him in the office in 3-4 weeks. He needs a duplex of his right upper extremity at that time to evaluate an axillary stenosis in the right arm. Thank you CD

## 2015-09-06 ENCOUNTER — Ambulatory Visit: Payer: 59 | Admitting: Anesthesiology

## 2015-09-06 ENCOUNTER — Ambulatory Visit
Admission: RE | Admit: 2015-09-06 | Discharge: 2015-09-06 | Disposition: A | Discharge status: Home / Self Care | Payer: 59 | Source: Ambulatory Visit | Attending: Ophthalmology | Admitting: Ophthalmology

## 2015-09-06 ENCOUNTER — Encounter
Admission: RE | Disposition: A | Discharge status: Home / Self Care | Payer: Self-pay | Source: Ambulatory Visit | Attending: Ophthalmology

## 2015-09-06 DIAGNOSIS — M1990 Unspecified osteoarthritis, unspecified site: Secondary | ICD-10-CM | POA: Insufficient documentation

## 2015-09-06 DIAGNOSIS — Z87891 Personal history of nicotine dependence: Secondary | ICD-10-CM | POA: Insufficient documentation

## 2015-09-06 DIAGNOSIS — D649 Anemia, unspecified: Secondary | ICD-10-CM | POA: Insufficient documentation

## 2015-09-06 DIAGNOSIS — Z9049 Acquired absence of other specified parts of digestive tract: Secondary | ICD-10-CM | POA: Insufficient documentation

## 2015-09-06 DIAGNOSIS — M25519 Pain in unspecified shoulder: Secondary | ICD-10-CM | POA: Insufficient documentation

## 2015-09-06 DIAGNOSIS — E113591 Type 2 diabetes mellitus with proliferative diabetic retinopathy without macular edema, right eye: Secondary | ICD-10-CM | POA: Insufficient documentation

## 2015-09-06 DIAGNOSIS — E1140 Type 2 diabetes mellitus with diabetic neuropathy, unspecified: Secondary | ICD-10-CM | POA: Insufficient documentation

## 2015-09-06 DIAGNOSIS — Z955 Presence of coronary angioplasty implant and graft: Secondary | ICD-10-CM | POA: Insufficient documentation

## 2015-09-06 DIAGNOSIS — I10 Essential (primary) hypertension: Secondary | ICD-10-CM | POA: Insufficient documentation

## 2015-09-06 DIAGNOSIS — I2510 Atherosclerotic heart disease of native coronary artery without angina pectoris: Secondary | ICD-10-CM | POA: Insufficient documentation

## 2015-09-06 DIAGNOSIS — Z9852 Vasectomy status: Secondary | ICD-10-CM | POA: Insufficient documentation

## 2015-09-06 DIAGNOSIS — K760 Fatty (change of) liver, not elsewhere classified: Secondary | ICD-10-CM | POA: Insufficient documentation

## 2015-09-06 DIAGNOSIS — H4089 Other specified glaucoma: Secondary | ICD-10-CM | POA: Insufficient documentation

## 2015-09-06 DIAGNOSIS — Z9849 Cataract extraction status, unspecified eye: Secondary | ICD-10-CM | POA: Insufficient documentation

## 2015-09-06 DIAGNOSIS — K219 Gastro-esophageal reflux disease without esophagitis: Secondary | ICD-10-CM | POA: Insufficient documentation

## 2015-09-06 HISTORY — DX: Unspecified osteoarthritis, unspecified site: M19.90

## 2015-09-06 HISTORY — DX: Localized edema: R60.0

## 2015-09-06 HISTORY — DX: Fatty (change of) liver, not elsewhere classified: K76.0

## 2015-09-06 HISTORY — PX: INSERTION EXPRESS TUBE SHUNT: SHX6610

## 2015-09-06 HISTORY — DX: Polyneuropathy, unspecified: G62.9

## 2015-09-06 HISTORY — DX: Atherosclerotic heart disease of native coronary artery without angina pectoris: I25.10

## 2015-09-06 HISTORY — DX: Unspecified asthma, uncomplicated: J45.909

## 2015-09-06 HISTORY — DX: Other seasonal allergic rhinitis: J30.2

## 2015-09-06 HISTORY — DX: Pain in unspecified shoulder: M25.519

## 2015-09-06 LAB — GLUCOSE, CAPILLARY: Glucose-Capillary: 250 mg/dL — ABNORMAL HIGH (ref 65–99)

## 2015-09-06 SURGERY — INSERTION EXPRESS TUBE SHUNT
Anesthesia: Monitor Anesthesia Care | Site: Eye | Laterality: Right | Wound class: Clean

## 2015-09-06 MED ORDER — SODIUM CHLORIDE 0.9 % IV SOLN
INTRAVENOUS | Status: DC
  Administered 2015-09-06: 10:00:00 via INTRAVENOUS

## 2015-09-06 MED ORDER — LIDOCAINE HCL 3.5 % OP GEL
1.0000 "application " | Freq: Once | OPHTHALMIC | Status: AC
  Administered 2015-09-06: 1 via OPHTHALMIC

## 2015-09-06 MED ORDER — MOXIFLOXACIN HCL 0.5 % OP SOLN
OPHTHALMIC | Status: AC
  Administered 2015-09-06: 1 [drp] via OPHTHALMIC
  Filled 2015-09-06: qty 3

## 2015-09-06 MED ORDER — LIDOCAINE HCL 3.5 % OP GEL
OPHTHALMIC | Status: AC
  Administered 2015-09-06: 1 via OPHTHALMIC
  Filled 2015-09-06: qty 1

## 2015-09-06 MED ORDER — LIDOCAINE HCL (PF) 4 % IJ SOLN
INTRAMUSCULAR | Status: AC
  Filled 2015-09-06: qty 5

## 2015-09-06 MED ORDER — MIDAZOLAM HCL 2 MG/2ML IJ SOLN
INTRAMUSCULAR | Status: DC | PRN
  Administered 2015-09-06: 1 mg via INTRAVENOUS

## 2015-09-06 MED ORDER — EPINEPHRINE HCL 1 MG/ML IJ SOLN
INTRAMUSCULAR | Status: AC
  Filled 2015-09-06: qty 1

## 2015-09-06 MED ORDER — LIDOCAINE HCL (PF) 4 % IJ SOLN
INTRAMUSCULAR | Status: DC | PRN
  Administered 2015-09-06: 4 mL via OPHTHALMIC

## 2015-09-06 MED ORDER — NEOMYCIN-POLYMYXIN-DEXAMETH 3.5-10000-0.1 OP OINT
TOPICAL_OINTMENT | OPHTHALMIC | Status: DC | PRN
  Administered 2015-09-06: 1 via OPHTHALMIC

## 2015-09-06 MED ORDER — CEFUROXIME OPHTHALMIC INJECTION 1 MG/0.1 ML
INJECTION | OPHTHALMIC | Status: DC | PRN
  Administered 2015-09-06: .1 mL via SUBCONJUNCTIVAL

## 2015-09-06 MED ORDER — CEFUROXIME OPHTHALMIC INJECTION 1 MG/0.1 ML
INJECTION | OPHTHALMIC | Status: AC
  Filled 2015-09-06: qty 0.1

## 2015-09-06 MED ORDER — SODIUM HYALURONATE 10 MG/ML IO SOLN
INTRAOCULAR | Status: AC
  Filled 2015-09-06: qty 0.85

## 2015-09-06 MED ORDER — HYALURONIDASE HUMAN 150 UNIT/ML IJ SOLN
INTRAMUSCULAR | Status: AC
  Filled 2015-09-06: qty 1

## 2015-09-06 MED ORDER — DEXAMETHASONE SODIUM PHOSPHATE 10 MG/ML IJ SOLN
INTRAMUSCULAR | Status: DC | PRN
  Administered 2015-09-06: 10 mg

## 2015-09-06 MED ORDER — NEOMYCIN-POLYMYXIN-DEXAMETH 3.5-10000-0.1 OP OINT
TOPICAL_OINTMENT | OPHTHALMIC | Status: AC
  Filled 2015-09-06: qty 3.5

## 2015-09-06 MED ORDER — MOXIFLOXACIN HCL 0.5 % OP SOLN
1.0000 [drp] | OPHTHALMIC | Status: AC
  Administered 2015-09-06 (×3): 1 [drp] via OPHTHALMIC

## 2015-09-06 MED ORDER — NA HYALUR & NA CHOND-NA HYALUR 0.55-0.5 ML IO KIT
PACK | INTRAOCULAR | Status: AC
  Filled 2015-09-06: qty 1.05

## 2015-09-06 MED ORDER — DEXAMETHASONE SODIUM PHOSPHATE 10 MG/ML IJ SOLN
INTRAMUSCULAR | Status: AC
  Filled 2015-09-06: qty 1

## 2015-09-06 MED ORDER — BUPIVACAINE HCL (PF) 0.75 % IJ SOLN
INTRAMUSCULAR | Status: AC
  Filled 2015-09-06: qty 10

## 2015-09-06 MED ORDER — ALFENTANIL 500 MCG/ML IJ INJ
INJECTION | INTRAMUSCULAR | Status: DC | PRN
  Administered 2015-09-06: 300 ug via INTRAVENOUS
  Administered 2015-09-06: 100 ug via INTRAVENOUS
  Administered 2015-09-06: 200 ug via INTRAVENOUS

## 2015-09-06 MED ORDER — SODIUM HYALURONATE 10 MG/ML IO SOLN
INTRAOCULAR | Status: DC | PRN
  Administered 2015-09-06: .5 mL via INTRAOCULAR

## 2015-09-06 SURGICAL SUPPLY — 35 items
5-0 SILK SUTURE ×1 IMPLANT
ALLOGRAFT TUTOPLST SCER0.5X1.0 (Tissue) IMPLANT
BANDAGE EYE OVAL (MISCELLANEOUS) ×3 IMPLANT
BLADE SURG MINI STRL (BLADE) ×1 IMPLANT
CORNEAL LIGHT SHIELD (MISCELLANEOUS) ×2
CUP MEDICINE 2OZ PLAST GRAD ST (MISCELLANEOUS) ×2 IMPLANT
ERASER HMR WETFIELD 18G (MISCELLANEOUS) ×2 IMPLANT
GLOVE BIO SURGEON STRL SZ8 (GLOVE) ×2 IMPLANT
GLOVE SURG LX 6.5 MICRO (GLOVE) ×1
GLOVE SURG LX 7.5 STRW (GLOVE) ×1
GLOVE SURG LX STRL 6.5 MICRO (GLOVE) ×1 IMPLANT
GLOVE SURG LX STRL 7.5 STRW (GLOVE) ×1 IMPLANT
GOWN STRL REUS W/ TWL LRG LVL3 (GOWN DISPOSABLE) ×2 IMPLANT
GOWN STRL REUS W/TWL LRG LVL3 (GOWN DISPOSABLE) ×4
KNIFE SIDECUT EYE (MISCELLANEOUS) ×2 IMPLANT
MARKER PEN SURG W/LABELS VIOL (MISCELLANEOUS) ×1 IMPLANT
NDL HYPO 27GX1-1/4 (NEEDLE) ×1 IMPLANT
NEEDLE HYPO 27GX1-1/4 (NEEDLE) ×2 IMPLANT
PACK CATARACT (MISCELLANEOUS) ×2 IMPLANT
PVA EYE SPEARS ×3 IMPLANT
SHIELD LIGHT CORNEAL (MISCELLANEOUS) ×1 IMPLANT
SOL BAL SALT 15ML (MISCELLANEOUS) ×4
SOLUTION BAL SALT 15ML (MISCELLANEOUS) ×1 IMPLANT
SUT ETHILON 10 0 CS140 6 (SUTURE) ×1 IMPLANT
SUT ETHILON 9-0 (SUTURE) ×1 IMPLANT
SUT SILK 5-0 (SUTURE) ×1 IMPLANT
SUT VICRYL  9 0 (SUTURE)
SUT VICRYL 8 0 BV 130 5 (SUTURE) ×2 IMPLANT
SUT VICRYL 9 0 (SUTURE) ×1 IMPLANT
SYR 3ML LL SCALE MARK (SYRINGE) ×2 IMPLANT
TUTOPLAST SCIERA 0.5X1.0 (Tissue) ×2 IMPLANT
VACUTAINER  23 G YELLOW ×1 IMPLANT
VALVE GLAUCOMA AHMED (Prosthesis & Implant Heart) ×1 IMPLANT
WATER STERILE IRR 1000ML POUR (IV SOLUTION) ×1 IMPLANT
WIPE NON LINTING 3.25X3.25 (MISCELLANEOUS) ×1 IMPLANT

## 2015-09-06 NOTE — Discharge Instructions (Signed)

## 2015-09-06 NOTE — Transfer of Care (Signed)
Immediate Anesthesia Transfer of Care Note  Patient: Chase Clark  Procedure(s) Performed: Procedure(s): INSERTION AHMED TUBE SHUNT with tutoplast allograft (Right)  Patient Location: PACU  Anesthesia Type:MAC  Level of Consciousness: awake, alert  and oriented  Airway & Oxygen Therapy: Patient Spontanous Breathing  Post-op Assessment: Report given to RN and Post -op Vital signs reviewed and stable  Post vital signs: Reviewed and stable  Last Vitals:  Filed Vitals:   09/05/15 1215 09/06/15 0949  BP: 70/49 90/63  Pulse: 65 80  Temp:  36.5 C  Resp:  16    Last Pain: There were no vitals filed for this visit.       Complications: No apparent anesthesia complications

## 2015-09-06 NOTE — Op Note (Signed)
OPERATIVE NOTE  ERDMAN ROHER [id number] 09/06/2015   PREOPERATIVE DIAGNOSIS:  Neovascular glaucoma.  Proliferative diabetic retinopathy, right eye.  POSTOPERATIVE DIAGNOSIS:    Neovascular glaucoma.  Proliferative diabetic retinopathy, right eye.    PROCEDURE:  Ahmed shunt placement with tutoplast, right eye.  :   Implant Name Type Inv. Item Serial No. Manufacturer Lot No. LRB No. Used  TUTOPLAST SCIERA 0.5X1.0 - [id number] Tissue TUTOPLAST SCIERA 0.5X1.0  RIT [id number] Right 1  VALVE GLAUCOMA AHMED - [id number] Prosthesis & Implant Heart VALVE GLAUCOMA AHMED [id number] [employer] [id number] Right 1       Shunt is an Ahmed FP7.    SURGEON:  Benay Pillow, MD, MPH  ANESTHESIOLOGIST: Anesthesiologist: Gunnar Bulla, MD CRNA: Demetrius Charity, CRNA   ANESTHESIA:  Retrobulbar with 50/50% mix of  4% lidocaine preservative free and 0.75% bupivicaine with a small amount of vitrase and monitored anesthesia care.  ESTIMATED BLOOD LOSS: approx 1 mL.   COMPLICATIONS:  None.   DESCRIPTION OF PROCEDURE:  The patient was identified in the holding room and transported to the operating room and placed in the supine position.  A time out was called identifiying the right eye as the operative eye and a retrobulbar block was administered in the standard fashion.  It was prepped and draped in the usual sterile ophthalmic fashion.  A marking pen was used to mark the 12:00 and 9:00 positions at the limbus.   A 5-0 silk traction suture was placed through the cornea and the eye rotated to expose the superotemporal quadrant.  A corneal sponge was placed to keep the cornea moist.  The conj was dissected from the limbus and posteriorly.  Radial relaxing incisions were made in the conjunctiva. A 19 gauge pencil cautery was used to obtain hemostasis for the exposed conjunctiva.  The calipers were set to 8.5 mm and the sclera was marked posterior to the limbus. The Ahmed tube shunt was brought onto the  field and was primed with BSS on a 27 gauge cannula.  Visualization of the fluid from the plate was confirmed. The plate was secured with two 9-0 nylon on a spatulated needle with partial thickness bites throught the sclera at the premarked sites and the sutures were buried in the eyelets of the plate.  The tube was trimmed to the appropriate length with an anterior bevel with westcott scissors.  A 23 gauge butterfly needle was used to create a short scleral tunnel and enter into the anterior chamber parallel to the iris.  The tube was inserted with tube inserter forceps and was in good position, not contacting the cornea or iris.  A 9-0 nylon suture was placed around the tube partial thickness through the sclera. The tutoplast was brought onto the field and trimmed and secured using 8-0 vicryl sutures.  The conjunctiva was reapproximated to the limbus with an 8-0 vicyrl suture running to the reapproximate the relaxing incisions at both 9 and 12:00 positions.  Subconj cefuroxime and dexamethasone were injected.  The lid speculum was removed, the face was cleaned with a wet and dry.  Vigamox drops were placed as well as maxitrol ointment.  A patch and shield were placed over the right eye.   The patient was taken to the recovery room in stable condition without complications of anesthesia or surgery.  The patient is to leave the patch and shield in place and we will see him in the clinic for followup tomorrow.  Benay Pillow 09/06/2015, 12:37 PM

## 2015-09-06 NOTE — OR Nursing (Signed)
Patient took 30 units of Lantus this am at 0930. REport to Dr Marcello Moores alone with FSBS of 250.  No new orders at this time. Also reportede to Dr Marcello Moores that patient was seen in ER this week with  Low blood pressure.

## 2015-09-06 NOTE — H&P (Addendum)
  The History and Physical notes are on paper, have been signed, and are to be scanned. The patient remains stable and unchanged from the H&P.   Previous H&P reviewed, patient examined, and there are no changes.  The patient did go to the ED due to low blood pressure on Tuesday night.  This was investigated and determined that it was safe to proceed with the glaucoma tube shunt surgery.   Chase Clark 09/06/2015 10:50 AM

## 2015-09-06 NOTE — Anesthesia Preprocedure Evaluation (Signed)
Anesthesia Evaluation  Patient identified by MRN, date of birth, ID band Patient awake    Reviewed: Allergy & Precautions, NPO status , Patient's Chart, lab work & pertinent test results, reviewed documented beta blocker date and time   Airway Mallampati: II  TM Distance: >3 FB     Dental  (+) Chipped   Pulmonary asthma , former smoker,           Cardiovascular hypertension, Pt. on medications + CAD and + Cardiac Stents       Neuro/Psych    GI/Hepatic GERD  Controlled,  Endo/Other  diabetes  Renal/GU      Musculoskeletal  (+) Arthritis ,   Abdominal   Peds  Hematology  (+) anemia ,   Anesthesia Other Findings BS 250. Took full dose of lantus this am. Shoulder pain. Has been told he has a plaque in the artery going to the R arm. Decreased pulse and BPs in the R arm. Cardiac stent 4 yrs ago.  Reproductive/Obstetrics                             Anesthesia Physical Anesthesia Plan  ASA: III  Anesthesia Plan: MAC   Post-op Pain Management:    Induction:   Airway Management Planned:   Additional Equipment:   Intra-op Plan:   Post-operative Plan:   Informed Consent: I have reviewed the patients History and Physical, chart, labs and discussed the procedure including the risks, benefits and alternatives for the proposed anesthesia with the patient or authorized representative who has indicated his/her understanding and acceptance.     Plan Discussed with: CRNA  Anesthesia Plan Comments:         Anesthesia Quick Evaluation

## 2015-09-06 NOTE — Anesthesia Postprocedure Evaluation (Signed)
Anesthesia Post Note  Patient: Chase Clark  Procedure(s) Performed: Procedure(s) (LRB): INSERTION AHMED TUBE SHUNT with tutoplast allograft (Right)  Patient location during evaluation: PACU Anesthesia Type: MAC Level of consciousness: awake and alert and oriented Pain management: satisfactory to patient Vital Signs Assessment: post-procedure vital signs reviewed and stable Respiratory status: respiratory function stable Cardiovascular status: stable Anesthetic complications: no    Last Vitals:  Filed Vitals:   09/05/15 1215 09/06/15 0949  BP: 70/49 90/63  Pulse: 65 80  Temp:  36.5 C  Resp:  16    Last Pain: There were no vitals filed for this visit.               Blima Singer

## 2015-09-07 ENCOUNTER — Encounter: Payer: Self-pay | Admitting: Ophthalmology

## 2015-09-12 DIAGNOSIS — I739 Peripheral vascular disease, unspecified: Secondary | ICD-10-CM | POA: Insufficient documentation

## 2015-09-24 ENCOUNTER — Encounter: Payer: Self-pay | Admitting: Vascular Surgery

## 2015-09-26 ENCOUNTER — Ambulatory Visit (HOSPITAL_COMMUNITY)
Admission: RE | Admit: 2015-09-26 | Discharge: 2015-09-26 | Disposition: A | Discharge status: Home / Self Care | Payer: 59 | Source: Ambulatory Visit | Attending: Vascular Surgery | Admitting: Vascular Surgery

## 2015-09-26 DIAGNOSIS — E785 Hyperlipidemia, unspecified: Secondary | ICD-10-CM | POA: Insufficient documentation

## 2015-09-26 DIAGNOSIS — K219 Gastro-esophageal reflux disease without esophagitis: Secondary | ICD-10-CM | POA: Diagnosis not present

## 2015-09-26 DIAGNOSIS — I10 Essential (primary) hypertension: Secondary | ICD-10-CM | POA: Insufficient documentation

## 2015-09-26 DIAGNOSIS — E119 Type 2 diabetes mellitus without complications: Secondary | ICD-10-CM | POA: Diagnosis not present

## 2015-09-26 DIAGNOSIS — I771 Stricture of artery: Secondary | ICD-10-CM | POA: Diagnosis not present

## 2015-09-26 DIAGNOSIS — I2510 Atherosclerotic heart disease of native coronary artery without angina pectoris: Secondary | ICD-10-CM | POA: Insufficient documentation

## 2015-09-26 DIAGNOSIS — I70208 Unspecified atherosclerosis of native arteries of extremities, other extremity: Secondary | ICD-10-CM | POA: Diagnosis not present

## 2015-10-03 ENCOUNTER — Encounter: Payer: Self-pay | Admitting: Vascular Surgery

## 2015-10-03 ENCOUNTER — Ambulatory Visit (INDEPENDENT_AMBULATORY_CARE_PROVIDER_SITE_OTHER): Payer: 59 | Admitting: Vascular Surgery

## 2015-10-03 VITALS — BP 107/74 | HR 84 | Ht 70.0 in | Wt 199.8 lb

## 2015-10-03 DIAGNOSIS — I742 Embolism and thrombosis of arteries of the upper extremities: Secondary | ICD-10-CM | POA: Diagnosis not present

## 2015-10-03 NOTE — Progress Notes (Signed)
Patient name: Chase Clark MRN: [id number] DOB: April 06, 1966 Sex: male  REASON FOR VISIT: Follow up of right axillary artery stenosis.  HPI: Chase Clark is a 50 y.o. male who I saw in consultation in the emergency department on 09/05/2015. He was having some injections to his eyes by his ophthalmologist. He was noted to have a low blood pressure in the left arm. Home his family member who is a nurse took the blood pressure in the right arm and was unable to obtain a blood pressure. The patient therefore presented to the emergency department. He denies any acute onset of symptoms in the right upper extremity.  He underwent a CT angiogram of the right upper extremity. It was difficult to determine based on the images however there appeared to be some narrowing of the axillary artery which was likely atherosclerotic plaque. It could also potentially delaminated thrombus but there was no reason for him to have embolized and there was no evidence of aneurysmal disease. The narrowing was over a fairly long length and did not appear to be an abrupt occlusion. I set him up for an office visit with a duplex of the axillary artery to see if this gave [place] any more useful information.  Since I saw him last he continues to have some pain in the biceps and the right arm and also the right shoulder. He also has a history of cervical disc disease. I do not get any symptoms of real pain in the lower arm or significant paresthesias in the lower arm.  Current Outpatient Prescriptions  Medication Sig Dispense Refill  . aspirin EC 325 MG tablet Take 1 tablet (325 mg total) by mouth daily. (Patient taking differently: Take 81 mg by mouth daily. ) 30 tablet 0  . Difluprednate (DUREZOL) 0.05 % EMUL Apply to eye.    . esomeprazole (NEXIUM) 40 MG capsule Take 1 capsule (40 mg total) by mouth daily. 90 capsule 0  . gabapentin (NEURONTIN) 300 MG capsule Take 1 capsule (300 mg total) by mouth 2 (two) times daily. (Patient  taking differently: Take 600 mg by mouth 2 (two) times daily. ) 180 capsule 3  . glucosamine-chondroitin 500-400 MG tablet Take 1 tablet by mouth daily.    . insulin glargine (LANTUS) 100 UNIT/ML injection Inject 90 Units into the skin 2 (two) times daily.    . insulin lispro (HUMALOG) 100 UNIT/ML injection Inject 60 Units into the skin 2 (two) times daily.    Marland Kitchen lisinopril (PRINIVIL,ZESTRIL) 20 MG tablet Take 20 mg by mouth daily.  0  . losartan-hydrochlorothiazide (HYZAAR) 100-25 MG tablet Take 1 tablet by mouth daily.  11  . metFORMIN (GLUCOPHAGE) 1000 MG tablet TAKE 1 TABLET BY MOUTH TWICE A DAY 180 tablet 0  . Multiple Vitamin (MULTIVITAMIN) tablet Take 1 tablet by mouth daily.    . NON FORMULARY Artifical tears    . acetaZOLAMIDE (DIAMOX) 500 MG capsule Take 500 mg by mouth 2 (two) times daily. Reported on 10/03/2015    . Canagliflozin (INVOKANA) 100 MG TABS Take 1 tablet (100 mg total) by mouth daily with breakfast. (Patient not taking: Reported on 10/03/2015) 30 tablet 6  . glucose blood (BAYER CONTOUR NEXT TEST) test strip Check blood sugar tid (Patient not taking: Reported on 10/03/2015) 100 each 5  . glucose blood test strip 1 each by Other route 3 (three) times daily. Use as instructed (Patient not taking: Reported on 10/03/2015) 100 each 12  . insulin regular (NOVOLIN R,HUMULIN  R) 100 units/mL injection Inject into the skin 3 (three) times daily before meals. Reported on 10/03/2015     No current facility-administered medications for this visit.    REVIEW OF SYSTEMS:  [X]  denotes positive finding, [ ]  denotes negative finding Cardiac  Comments:  Chest pain or chest pressure:    Shortness of breath upon exertion:    Short of breath when lying flat:    Irregular heart rhythm:    Constitutional    Fever or chills:      PHYSICAL EXAM: Filed Vitals:   10/03/15 1244  BP: 107/74  Pulse: 84  Height: 5\' 10"  (1.778 m)  Weight: 199 lb 12.8 oz (90.629 kg)  SpO2: 99%    GENERAL: The  patient is a well-nourished male, in no acute distress. The vital signs are documented above. CARDIOVASCULAR: There is a regular rate and rhythm. PULMONARY: There is good air exchange bilaterally without wheezing or rales. I do not detect carotid bruits. He has a palpable left radial pulse. I cannot palpate a right radial pulse.  UPPER EXTREMITY ARTERIAL DUPLEX: there is homogeneous plaque noted in the axillary artery. He has a triphasic subclavian signal with monophasic signals at theaxillary level and below.  MEDICAL ISSUES:  RIGHT AXILLARY ARTERY STENOSIS: I think that the right axillary artery stenosis is most likely an incidental finding. I really cannot explain his pain in the biceps and right shoulder. He also has significant cervical disc disease by history and has had some weakness in the arm which could be related to this. He has reasonable Doppler flow at the right wrist. I have encouraged him to continue his follow up of his right rotator cuff issues with Dr. Veverly Fells. I have also recommended that he continue to follow up concerning his cervical disc disease. If his symptoms are not felt to be attributable to this, then certainly we could consider a right upper extremity arteriogram to further evaluate his options for revascularization. However, given the location of the stenosis in the axillary artery the options will not be ideal. As the stenosis is near the axilla he would not be a candidate for an endovascular approach to this. Likewise, bypasses in this area are oftentimes unsuccessful because of impingement in the thoracic outlet. The best option if he required revascularization would be endarterectomy and patch angioplasty if this weren't technically feasible. However, again I'm not convinced that his symptoms can be attributable to this and typically the collaterals in the upper extremity are excellent. I plan on seeing him back in 6 months to see how is doing. He is to call sooner if he  has problems.  Deitra Mayo [facility] [phone]

## 2016-04-21 DIAGNOSIS — E113513 Type 2 diabetes mellitus with proliferative diabetic retinopathy with macular edema, bilateral: Secondary | ICD-10-CM | POA: Diagnosis not present

## 2016-04-21 DIAGNOSIS — H4051X4 Glaucoma secondary to other eye disorders, right eye, indeterminate stage: Secondary | ICD-10-CM | POA: Diagnosis not present

## 2016-04-28 DIAGNOSIS — E113513 Type 2 diabetes mellitus with proliferative diabetic retinopathy with macular edema, bilateral: Secondary | ICD-10-CM | POA: Diagnosis not present

## 2016-05-17 ENCOUNTER — Emergency Department: Payer: 59

## 2016-05-17 ENCOUNTER — Encounter: Payer: Self-pay | Admitting: Emergency Medicine

## 2016-05-17 ENCOUNTER — Inpatient Hospital Stay
Admission: EM | Admit: 2016-05-17 | Discharge: 2016-05-21 | DRG: 871 | Disposition: A | Discharge status: Home Health | Payer: 59 | Attending: Internal Medicine | Admitting: Internal Medicine

## 2016-05-17 DIAGNOSIS — I10 Essential (primary) hypertension: Secondary | ICD-10-CM | POA: Diagnosis present

## 2016-05-17 DIAGNOSIS — E1152 Type 2 diabetes mellitus with diabetic peripheral angiopathy with gangrene: Secondary | ICD-10-CM | POA: Diagnosis not present

## 2016-05-17 DIAGNOSIS — E1110 Type 2 diabetes mellitus with ketoacidosis without coma: Secondary | ICD-10-CM

## 2016-05-17 DIAGNOSIS — Z9114 Patient's other noncompliance with medication regimen: Secondary | ICD-10-CM | POA: Diagnosis not present

## 2016-05-17 DIAGNOSIS — Z794 Long term (current) use of insulin: Secondary | ICD-10-CM

## 2016-05-17 DIAGNOSIS — E860 Dehydration: Secondary | ICD-10-CM | POA: Diagnosis present

## 2016-05-17 DIAGNOSIS — Z87891 Personal history of nicotine dependence: Secondary | ICD-10-CM

## 2016-05-17 DIAGNOSIS — E7800 Pure hypercholesterolemia, unspecified: Secondary | ICD-10-CM | POA: Diagnosis present

## 2016-05-17 DIAGNOSIS — E1310 Other specified diabetes mellitus with ketoacidosis without coma: Secondary | ICD-10-CM | POA: Diagnosis not present

## 2016-05-17 DIAGNOSIS — L97423 Non-pressure chronic ulcer of left heel and midfoot with necrosis of muscle: Secondary | ICD-10-CM | POA: Diagnosis not present

## 2016-05-17 DIAGNOSIS — H409 Unspecified glaucoma: Secondary | ICD-10-CM | POA: Diagnosis present

## 2016-05-17 DIAGNOSIS — A419 Sepsis, unspecified organism: Secondary | ICD-10-CM | POA: Diagnosis not present

## 2016-05-17 DIAGNOSIS — K219 Gastro-esophageal reflux disease without esophagitis: Secondary | ICD-10-CM | POA: Diagnosis present

## 2016-05-17 DIAGNOSIS — E11621 Type 2 diabetes mellitus with foot ulcer: Secondary | ICD-10-CM | POA: Diagnosis not present

## 2016-05-17 DIAGNOSIS — K760 Fatty (change of) liver, not elsewhere classified: Secondary | ICD-10-CM | POA: Diagnosis present

## 2016-05-17 DIAGNOSIS — L97502 Non-pressure chronic ulcer of other part of unspecified foot with fat layer exposed: Secondary | ICD-10-CM | POA: Diagnosis not present

## 2016-05-17 DIAGNOSIS — L97509 Non-pressure chronic ulcer of other part of unspecified foot with unspecified severity: Secondary | ICD-10-CM | POA: Diagnosis not present

## 2016-05-17 DIAGNOSIS — K529 Noninfective gastroenteritis and colitis, unspecified: Secondary | ICD-10-CM | POA: Diagnosis not present

## 2016-05-17 DIAGNOSIS — L97329 Non-pressure chronic ulcer of left ankle with unspecified severity: Secondary | ICD-10-CM | POA: Diagnosis not present

## 2016-05-17 DIAGNOSIS — E785 Hyperlipidemia, unspecified: Secondary | ICD-10-CM | POA: Diagnosis present

## 2016-05-17 DIAGNOSIS — E871 Hypo-osmolality and hyponatremia: Secondary | ICD-10-CM

## 2016-05-17 DIAGNOSIS — Z955 Presence of coronary angioplasty implant and graft: Secondary | ICD-10-CM | POA: Diagnosis not present

## 2016-05-17 DIAGNOSIS — E13621 Other specified diabetes mellitus with foot ulcer: Secondary | ICD-10-CM

## 2016-05-17 DIAGNOSIS — L97409 Non-pressure chronic ulcer of unspecified heel and midfoot with unspecified severity: Secondary | ICD-10-CM

## 2016-05-17 DIAGNOSIS — Z7982 Long term (current) use of aspirin: Secondary | ICD-10-CM | POA: Diagnosis not present

## 2016-05-17 DIAGNOSIS — E1151 Type 2 diabetes mellitus with diabetic peripheral angiopathy without gangrene: Secondary | ICD-10-CM | POA: Diagnosis present

## 2016-05-17 DIAGNOSIS — L97429 Non-pressure chronic ulcer of left heel and midfoot with unspecified severity: Secondary | ICD-10-CM | POA: Diagnosis present

## 2016-05-17 DIAGNOSIS — Z9049 Acquired absence of other specified parts of digestive tract: Secondary | ICD-10-CM | POA: Diagnosis not present

## 2016-05-17 DIAGNOSIS — E1140 Type 2 diabetes mellitus with diabetic neuropathy, unspecified: Secondary | ICD-10-CM | POA: Diagnosis present

## 2016-05-17 DIAGNOSIS — Z8249 Family history of ischemic heart disease and other diseases of the circulatory system: Secondary | ICD-10-CM

## 2016-05-17 DIAGNOSIS — Z833 Family history of diabetes mellitus: Secondary | ICD-10-CM | POA: Diagnosis not present

## 2016-05-17 DIAGNOSIS — L97529 Non-pressure chronic ulcer of other part of left foot with unspecified severity: Secondary | ICD-10-CM | POA: Diagnosis not present

## 2016-05-17 DIAGNOSIS — I2510 Atherosclerotic heart disease of native coronary artery without angina pectoris: Secondary | ICD-10-CM | POA: Diagnosis present

## 2016-05-17 DIAGNOSIS — Z539 Procedure and treatment not carried out, unspecified reason: Secondary | ICD-10-CM | POA: Diagnosis not present

## 2016-05-17 DIAGNOSIS — R197 Diarrhea, unspecified: Secondary | ICD-10-CM

## 2016-05-17 DIAGNOSIS — L97505 Non-pressure chronic ulcer of other part of unspecified foot with muscle involvement without evidence of necrosis: Secondary | ICD-10-CM

## 2016-05-17 DIAGNOSIS — I70248 Atherosclerosis of native arteries of left leg with ulceration of other part of lower left leg: Secondary | ICD-10-CM | POA: Diagnosis not present

## 2016-05-17 LAB — URINALYSIS, COMPLETE (UACMP) WITH MICROSCOPIC
Bacteria, UA: NONE SEEN
Bilirubin Urine: NEGATIVE
Glucose, UA: >=500 mg/dL — AB
Ketones, ur: 80 mg/dL — AB
Leukocytes, UA: NEGATIVE
Nitrite: NEGATIVE
Protein, ur: 30 mg/dL — AB
RBC / HPF: NONE SEEN RBC/hpf (ref 0–5)
Specific Gravity, Urine: 1.021 (ref 1.005–1.030)
pH: 5 (ref 5.0–8.0)

## 2016-05-17 LAB — GLUCOSE, CAPILLARY
Glucose-Capillary: 347 mg/dL — ABNORMAL HIGH (ref 65–99)
Glucose-Capillary: 290 mg/dL — ABNORMAL HIGH (ref 65–99)
Glucose-Capillary: 279 mg/dL — ABNORMAL HIGH (ref 65–99)
Glucose-Capillary: 270 mg/dL — ABNORMAL HIGH (ref 65–99)
Glucose-Capillary: 298 mg/dL — ABNORMAL HIGH (ref 65–99)
Glucose-Capillary: 248 mg/dL — ABNORMAL HIGH (ref 65–99)
Glucose-Capillary: 236 mg/dL — ABNORMAL HIGH (ref 65–99)
Glucose-Capillary: 233 mg/dL — ABNORMAL HIGH (ref 65–99)

## 2016-05-17 LAB — CBC WITH DIFFERENTIAL/PLATELET
Basophils Absolute: 0.1 10*3/uL (ref 0–0.1)
Basophils Relative: 1 %
Eosinophils Absolute: 0.1 10*3/uL (ref 0–0.7)
Eosinophils Relative: 1 %
HCT: 38.3 % — ABNORMAL LOW (ref 40.0–52.0)
Hemoglobin: 12.7 g/dL — ABNORMAL LOW (ref 13.0–18.0)
Lymphocytes Relative: 6 %
Lymphs Abs: 1.4 10*3/uL (ref 1.0–3.6)
MCH: 29.9 pg (ref 26.0–34.0)
MCHC: 33.1 g/dL (ref 32.0–36.0)
MCV: 90.4 fL (ref 80.0–100.0)
Monocytes Absolute: 1.3 10*3/uL — ABNORMAL HIGH (ref 0.2–1.0)
Monocytes Relative: 6 %
Neutro Abs: 20.2 10*3/uL — ABNORMAL HIGH (ref 1.4–6.5)
Neutrophils Relative %: 86 %
Platelets: 425 10*3/uL (ref 150–440)
RBC: 4.24 MIL/uL — ABNORMAL LOW (ref 4.40–5.90)
RDW: 13.1 % (ref 11.5–14.5)
WBC: 23.2 10*3/uL — ABNORMAL HIGH (ref 3.8–10.6)

## 2016-05-17 LAB — COMPREHENSIVE METABOLIC PANEL
ALT: 12 U/L — ABNORMAL LOW (ref 17–63)
AST: 14 U/L — ABNORMAL LOW (ref 15–41)
Albumin: 2.8 g/dL — ABNORMAL LOW (ref 3.5–5.0)
Alkaline Phosphatase: 137 U/L — ABNORMAL HIGH (ref 38–126)
Anion gap: 25 — ABNORMAL HIGH (ref 5–15)
BUN: 29 mg/dL — ABNORMAL HIGH (ref 6–20)
CO2: 14 mmol/L — ABNORMAL LOW (ref 22–32)
Calcium: 10.1 mg/dL (ref 8.9–10.3)
Chloride: 89 mmol/L — ABNORMAL LOW (ref 101–111)
Creatinine, Ser: 1.34 mg/dL — ABNORMAL HIGH (ref 0.61–1.24)
GFR calc Af Amer: >60 mL/min (ref >60)
GFR calc non Af Amer: 59 mL/min — ABNORMAL LOW (ref >60)
Glucose, Bld: 395 mg/dL — ABNORMAL HIGH (ref 65–99)
Potassium: 4.2 mmol/L (ref 3.5–5.1)
Sodium: 128 mmol/L — ABNORMAL LOW (ref 135–145)
Total Bilirubin: 2.2 mg/dL — ABNORMAL HIGH (ref 0.3–1.2)
Total Protein: 8 g/dL (ref 6.5–8.1)

## 2016-05-17 LAB — BASIC METABOLIC PANEL
Anion gap: 18 — ABNORMAL HIGH (ref 5–15)
BUN: 22 mg/dL — ABNORMAL HIGH (ref 6–20)
CO2: 15 mmol/L — ABNORMAL LOW (ref 22–32)
Calcium: 9.3 mg/dL (ref 8.9–10.3)
Chloride: 97 mmol/L — ABNORMAL LOW (ref 101–111)
Creatinine, Ser: 1.15 mg/dL (ref 0.61–1.24)
GFR calc Af Amer: >60 mL/min (ref >60)
GFR calc non Af Amer: >60 mL/min (ref >60)
Glucose, Bld: 285 mg/dL — ABNORMAL HIGH (ref 65–99)
Potassium: 3.6 mmol/L (ref 3.5–5.1)
Sodium: 130 mmol/L — ABNORMAL LOW (ref 135–145)

## 2016-05-17 LAB — MRSA PCR SCREENING: MRSA by PCR: NEGATIVE

## 2016-05-17 MED ORDER — SODIUM CHLORIDE 0.9 % IV SOLN
INTRAVENOUS | Status: DC
  Administered 2016-05-17: 4.8 [IU]/h via INTRAVENOUS
  Administered 2016-05-17: 2.1 [IU]/h via INTRAVENOUS
  Administered 2016-05-18: 2.9 [IU]/h via INTRAVENOUS
  Filled 2016-05-17: qty 2.5

## 2016-05-17 MED ORDER — PANTOPRAZOLE SODIUM 40 MG PO TBEC
40.0000 mg | DELAYED_RELEASE_TABLET | Freq: Every day | ORAL | Status: DC
  Administered 2016-05-17 – 2016-05-21 (×5): 40 mg via ORAL
  Filled 2016-05-17 (×5): qty 1

## 2016-05-17 MED ORDER — ONDANSETRON HCL 4 MG/2ML IJ SOLN: 4.0000 mg | Freq: Four times a day (QID) | INTRAMUSCULAR | Status: DC | PRN

## 2016-05-17 MED ORDER — HEPARIN SODIUM (PORCINE) 5000 UNIT/ML IJ SOLN
5000.0000 [IU] | Freq: Three times a day (TID) | INTRAMUSCULAR | Status: DC
  Administered 2016-05-17 – 2016-05-21 (×11): 5000 [IU] via SUBCUTANEOUS
  Filled 2016-05-17 (×11): qty 1

## 2016-05-17 MED ORDER — INSULIN ASPART 100 UNIT/ML ~~LOC~~ SOLN: 0.0000 [IU] | SUBCUTANEOUS | Status: DC

## 2016-05-17 MED ORDER — ASPIRIN EC 81 MG PO TBEC
81.0000 mg | DELAYED_RELEASE_TABLET | Freq: Every day | ORAL | Status: DC
  Administered 2016-05-18 – 2016-05-21 (×4): 81 mg via ORAL
  Filled 2016-05-17 (×4): qty 1

## 2016-05-17 MED ORDER — DIFLUPREDNATE 0.05 % OP EMUL
1.0000 [drp] | Freq: Two times a day (BID) | OPHTHALMIC | Status: DC
  Administered 2016-05-17 – 2016-05-21 (×8): 1 [drp] via OPHTHALMIC
  Filled 2016-05-17: qty 5

## 2016-05-17 MED ORDER — SODIUM CHLORIDE 0.9% FLUSH
3.0000 mL | Freq: Two times a day (BID) | INTRAVENOUS | Status: DC
  Administered 2016-05-17 – 2016-05-20 (×5): 3 mL via INTRAVENOUS

## 2016-05-17 MED ORDER — BRIMONIDINE TARTRATE-TIMOLOL 0.2-0.5 % OP SOLN
1.0000 [drp] | Freq: Two times a day (BID) | OPHTHALMIC | Status: DC
  Administered 2016-05-17 – 2016-05-21 (×8): 1 [drp] via OPHTHALMIC
  Filled 2016-05-17 (×2): qty 5

## 2016-05-17 MED ORDER — VANCOMYCIN HCL IN DEXTROSE 1-5 GM/200ML-% IV SOLN
1000.0000 mg | Freq: Two times a day (BID) | INTRAVENOUS | Status: DC
  Administered 2016-05-18 (×2): 1000 mg via INTRAVENOUS
  Filled 2016-05-17 (×3): qty 200

## 2016-05-17 MED ORDER — VANCOMYCIN HCL IN DEXTROSE 1-5 GM/200ML-% IV SOLN
1000.0000 mg | Freq: Once | INTRAVENOUS | Status: AC
  Administered 2016-05-17: 1000 mg via INTRAVENOUS
  Filled 2016-05-17: qty 200

## 2016-05-17 MED ORDER — SODIUM CHLORIDE 0.9 % IV BOLUS (SEPSIS)
1000.0000 mL | Freq: Once | INTRAVENOUS | Status: AC
  Administered 2016-05-17: 1000 mL via INTRAVENOUS

## 2016-05-17 MED ORDER — INFLUENZA VAC SPLIT QUAD 0.5 ML IM SUSY
0.5000 mL | PREFILLED_SYRINGE | INTRAMUSCULAR | Status: AC
  Administered 2016-05-18: 0.5 mL via INTRAMUSCULAR
  Filled 2016-05-17: qty 0.5

## 2016-05-17 MED ORDER — DEXTROSE-NACL 5-0.45 % IV SOLN
INTRAVENOUS | Status: DC
  Administered 2016-05-17 – 2016-05-18 (×2): via INTRAVENOUS

## 2016-05-17 MED ORDER — BRIMONIDINE TARTRATE-TIMOLOL 0.2-0.5 % OP SOLN: 1.0000 [drp] | Freq: Two times a day (BID) | OPHTHALMIC | Status: DC

## 2016-05-17 MED ORDER — LOSARTAN POTASSIUM 50 MG PO TABS
100.0000 mg | ORAL_TABLET | Freq: Every day | ORAL | Status: DC
  Administered 2016-05-18 – 2016-05-21 (×3): 100 mg via ORAL
  Filled 2016-05-17 (×3): qty 2

## 2016-05-17 MED ORDER — ACETAMINOPHEN 500 MG PO TABS
1000.0000 mg | ORAL_TABLET | Freq: Once | ORAL | Status: AC
  Administered 2016-05-17: 1000 mg via ORAL
  Filled 2016-05-17: qty 2

## 2016-05-17 MED ORDER — LATANOPROST 0.005 % OP SOLN
1.0000 [drp] | Freq: Every day | OPHTHALMIC | Status: DC
  Administered 2016-05-17 – 2016-05-20 (×4): 1 [drp] via OPHTHALMIC
  Filled 2016-05-17: qty 2.5

## 2016-05-17 MED ORDER — PREDNISOLONE ACETATE 1 % OP SUSP
2.0000 [drp] | Freq: Every day | OPHTHALMIC | Status: DC
  Filled 2016-05-17: qty 1

## 2016-05-17 MED ORDER — BRIMONIDINE TARTRATE 0.2 % OP SOLN
1.0000 [drp] | Freq: Two times a day (BID) | OPHTHALMIC | Status: DC
  Filled 2016-05-17: qty 5

## 2016-05-17 MED ORDER — ASPIRIN EC 81 MG PO TBEC: 81.0000 mg | DELAYED_RELEASE_TABLET | Freq: Every day | ORAL | Status: DC

## 2016-05-17 MED ORDER — METFORMIN HCL 500 MG PO TABS
1000.0000 mg | ORAL_TABLET | Freq: Two times a day (BID) | ORAL | Status: DC
  Administered 2016-05-18 – 2016-05-19 (×3): 1000 mg via ORAL
  Filled 2016-05-17 (×3): qty 2

## 2016-05-17 MED ORDER — HYDROCHLOROTHIAZIDE 25 MG PO TABS
25.0000 mg | ORAL_TABLET | Freq: Every day | ORAL | Status: DC
  Administered 2016-05-18 – 2016-05-21 (×3): 25 mg via ORAL
  Filled 2016-05-17 (×3): qty 1

## 2016-05-17 MED ORDER — SODIUM CHLORIDE 0.9 % IV SOLN
INTRAVENOUS | Status: DC
  Administered 2016-05-17: 20:00:00 via INTRAVENOUS

## 2016-05-17 MED ORDER — DIPHENOXYLATE-ATROPINE 2.5-0.025 MG PO TABS
1.0000 | ORAL_TABLET | Freq: Four times a day (QID) | ORAL | Status: DC | PRN
  Administered 2016-05-19: 1 via ORAL
  Filled 2016-05-17: qty 1

## 2016-05-17 MED ORDER — ADULT MULTIVITAMIN W/MINERALS CH
1.0000 | ORAL_TABLET | Freq: Every day | ORAL | Status: DC
  Administered 2016-05-18 – 2016-05-21 (×4): 1 via ORAL
  Filled 2016-05-17 (×4): qty 1

## 2016-05-17 MED ORDER — ACETAMINOPHEN 650 MG RE SUPP: 650.0000 mg | Freq: Four times a day (QID) | RECTAL | Status: DC | PRN

## 2016-05-17 MED ORDER — ONDANSETRON HCL 4 MG PO TABS: 4.0000 mg | ORAL_TABLET | Freq: Four times a day (QID) | ORAL | Status: DC | PRN

## 2016-05-17 MED ORDER — GABAPENTIN 300 MG PO CAPS
600.0000 mg | ORAL_CAPSULE | Freq: Two times a day (BID) | ORAL | Status: DC
  Administered 2016-05-17 – 2016-05-21 (×8): 600 mg via ORAL
  Filled 2016-05-17 (×8): qty 2

## 2016-05-17 MED ORDER — INSULIN ASPART 100 UNIT/ML ~~LOC~~ SOLN
15.0000 [IU] | Freq: Once | SUBCUTANEOUS | Status: AC
  Administered 2016-05-17: 15 [IU] via INTRAVENOUS
  Filled 2016-05-17: qty 15

## 2016-05-17 MED ORDER — LISINOPRIL 20 MG PO TABS
40.0000 mg | ORAL_TABLET | Freq: Every day | ORAL | Status: DC
  Administered 2016-05-18 – 2016-05-21 (×4): 40 mg via ORAL
  Filled 2016-05-17 (×4): qty 2

## 2016-05-17 MED ORDER — DIFLUPREDNATE 0.05 % OP EMUL: 1.0000 [drp] | Freq: Every day | OPHTHALMIC | Status: DC

## 2016-05-17 MED ORDER — ACETAMINOPHEN 325 MG PO TABS
650.0000 mg | ORAL_TABLET | Freq: Four times a day (QID) | ORAL | Status: DC | PRN
  Administered 2016-05-17 – 2016-05-20 (×4): 650 mg via ORAL
  Filled 2016-05-17 (×4): qty 2

## 2016-05-17 MED ORDER — TIMOLOL MALEATE 0.5 % OP SOLN
1.0000 [drp] | Freq: Two times a day (BID) | OPHTHALMIC | Status: DC
  Filled 2016-05-17: qty 5

## 2016-05-17 MED ORDER — LOSARTAN POTASSIUM-HCTZ 100-25 MG PO TABS: 1.0000 | ORAL_TABLET | Freq: Every day | ORAL | Status: DC

## 2016-05-17 NOTE — ED Notes (Signed)
POCT CBG AT BEDSIDE 394

## 2016-05-17 NOTE — ED Notes (Addendum)
Body aches, NVD, fatigue x 1 week. Pt diabetic.    Ulceration to left foot.

## 2016-05-17 NOTE — ED Triage Notes (Signed)
Diarrhea x 1 week. Diabetic and has not been checking FSBS.

## 2016-05-17 NOTE — ED Provider Notes (Signed)
[facility] Emergency Department Provider Note   ____________________________________________   I have reviewed the triage vital signs and the nursing notes.   HISTORY  Chief Complaint Diarrhea   History limited by: Not Limited   HPI Chase Clark is a 51 y.o. male who presents to the emergency department today because of concern for diarrhea. The patient states that the symptoms started roughly one week ago. The patient states that he has been visiting his mother in the hospital frequently. The patient has had a couple episodes of nausea associated with this with one episode of vomiting. The patient has not had any measured fevers.    Past Medical History:  Diagnosis Date  . Arthritis   . Asthma    as child  . Coronary artery disease   . Diabetes mellitus without complication ([place])   . Edema, lower extremity   . Fatty liver   . GERD (gastroesophageal reflux disease)   . Glaucoma   . Heart disease 2011   Patient has stent for 80% blockage  . Hyperlipidemia   . Hypertension   . Neuropathy ([place])   . Seasonal allergies   . Shoulder pain   . Ulcer of foot ([place]) 08.06.2014   DIABETIC ULCERATIONS ASSOCIATED WITH IRRITATION LATERAL ANKLE LEFT GREATER THAN RIGHT WITH MILD CELLULITIS    Patient Active Problem List   Diagnosis Date Noted  . Anemia 01/12/2014  . Disorder of ligament of ankle 07/31/2013  . Edema 06/28/2013  . 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 Obesity (BMI 30-39.9) 06/12/2013  . Pain in joint, shoulder region 06/12/2013  . Statin intolerance 06/10/2013  . Ulcer of foot ([place])   . DM (diabetes mellitus), type 2, uncontrolled w/ophthalmic complication ([place]) 54/62/7035  . Hyperlipidemia associated with type 2 diabetes mellitus ([place]) 07/17/2006  . GILBERT'S SYNDROME 07/17/2006  . Essential hypertension 07/17/2006  . Coronary atherosclerosis 07/17/2006  . OSTEOARTHRITIS 07/17/2006  . COUGH 07/17/2006    Past  Surgical History:  Procedure Laterality Date  . CARDIAC CATHETERIZATION    . CATARACT EXTRACTION W/ INTRAOCULAR LENS IMPLANT    . CHOLECYSTECTOMY N/A   . CORONARY ANGIOPLASTY WITH STENT PLACEMENT  2007   1st diagonal  . EPIBLEPHERON REPAIR WITH TEAR DUCT PROBING    . EYE SURGERY Right sept 29n2015   cataract extraction  . INSERTION EXPRESS TUBE SHUNT Right 09/06/2015   Procedure: INSERTION AHMED TUBE SHUNT with tutoplast allograft;  Surgeon: Eulogio Bear, MD;  Location: [facility] ORS;  Service: Ophthalmology;  Laterality: Right;  Marland Kitchen VASECTOMY      Prior to Admission medications   Medication Sig Start Date End Date Taking? Authorizing Provider  acetaZOLAMIDE (DIAMOX) 500 MG capsule Take 500 mg by mouth 2 (two) times daily. Reported on 10/03/2015    Historical Provider, MD  aspirin EC 325 MG tablet Take 1 tablet (325 mg total) by mouth daily. Patient taking differently: Take 81 mg by mouth daily.  09/05/15   Varney Biles, MD  Canagliflozin (INVOKANA) 100 MG TABS Take 1 tablet (100 mg total) by mouth daily with breakfast. Patient not taking: Reported on 10/03/2015 01/19/14   Radhika P Phadke, MD  Difluprednate (DUREZOL) 0.05 % EMUL Apply to eye.    Historical Provider, MD  esomeprazole (NEXIUM) 40 MG capsule Take 1 capsule (40 mg total) by mouth daily. 09/15/14   [name], MD  gabapentin (NEURONTIN) 300 MG capsule Take 1 capsule (300 mg total) by mouth 2 (two) times daily. Patient taking  differently: Take 600 mg by mouth 2 (two) times daily.  03/15/15   Trula Slade, DPM  glucosamine-chondroitin 500-400 MG tablet Take 1 tablet by mouth daily.    Historical Provider, MD  glucose blood (BAYER CONTOUR NEXT TEST) test strip Check blood sugar tid Patient not taking: Reported on 10/03/2015 06/15/13   [name], MD  glucose blood test strip 1 each by Other route 3 (three) times daily. Use as instructed Patient not taking: Reported on 10/03/2015 06/13/13   [name], MD  insulin glargine  (LANTUS) 100 UNIT/ML injection Inject 90 Units into the skin 2 (two) times daily.    Historical Provider, MD  insulin lispro (HUMALOG) 100 UNIT/ML injection Inject 60 Units into the skin 2 (two) times daily.    Historical Provider, MD  insulin regular (NOVOLIN R,HUMULIN R) 100 units/mL injection Inject into the skin 3 (three) times daily before meals. Reported on 10/03/2015    Historical Provider, MD  lisinopril (PRINIVIL,ZESTRIL) 20 MG tablet Take 20 mg by mouth daily. 08/18/15   Historical Provider, MD  losartan-hydrochlorothiazide (HYZAAR) 100-25 MG tablet Take 1 tablet by mouth daily. 07/15/15   Historical Provider, MD  metFORMIN (GLUCOPHAGE) 1000 MG tablet TAKE 1 TABLET BY MOUTH TWICE A DAY 11/08/14   [name], MD  Multiple Vitamin (MULTIVITAMIN) tablet Take 1 tablet by mouth daily.    Historical Provider, MD  NON FORMULARY Artifical tears    Historical Provider, MD    Allergies Atorvastatin and Codeine sulfate  Family History  Problem Relation Age of Onset  . Hyperlipidemia Mother   . Diabetes Mother   . Hyperlipidemia Father   . Diabetes Father   . Heart disease Father   . Hypertension Father     Social History Social History  Substance Use Topics  . Smoking status: Former Research scientist (life sciences)  . Smokeless tobacco: Never Used  . Alcohol use Yes    Review of Systems  Constitutional: Negative for fever. Cardiovascular: Negative for chest pain. Respiratory: Negative for shortness of breath. Gastrointestinal: Positive for diarrhea.  Neurological: Negative for headaches, focal weakness or numbness.  10-point ROS otherwise negative.  ____________________________________________   PHYSICAL EXAM:  VITAL SIGNS: ED Triage Vitals  Enc Vitals Group     BP 05/17/16 1319 114/80     Pulse Rate 05/17/16 1319 95     Resp 05/17/16 1319 20     Temp 05/17/16 1319 97.4 F (36.3 C)     Temp Source 05/17/16 1319 Oral     SpO2 05/17/16 1319 100 %     Weight 05/17/16 1321 195 lb (88.5 kg)      Height 05/17/16 1321 5\' 10"  (1.778 m)     Head Circumference --      Peak Flow --      Pain Score 05/17/16 1321 5   Constitutional: Alert and oriented. Well appearing and in no distress. Eyes: Conjunctivae are normal. Normal extraocular movements. ENT   Head: Normocephalic and atraumatic.   Nose: No congestion/rhinnorhea.   Mouth/Throat: Mucous membranes are moist.   Neck: No stridor. Hematological/Lymphatic/Immunilogical: No cervical lymphadenopathy. Cardiovascular: Normal rate, regular rhythm.  No murmurs, rubs, or gallops.  Respiratory: Normal respiratory effort without tachypnea nor retractions. Breath sounds are clear and equal bilaterally. No wheezes/rales/rhonchi. Gastrointestinal: Soft and non tender. No rebound. No guarding.  Genitourinary: Deferred Musculoskeletal: Normal range of motion in all extremities. No lower extremity edema. Neurologic:  Normal speech and language. No gross focal neurologic deficits are appreciated.  Skin:  Skin is warm, dry and intact. No rash noted. Psychiatric: Mood and affect are normal. Speech and behavior are normal. Patient exhibits appropriate insight and judgment.  ____________________________________________    LABS (pertinent positives/negatives)  Labs Reviewed  CBC WITH DIFFERENTIAL/PLATELET - Abnormal; Notable for the following:       Result Value   WBC 23.2 (*)    RBC 4.24 (*)    Hemoglobin 12.7 (*)    HCT 38.3 (*)    Neutro Abs 20.2 (*)    Monocytes Absolute 1.3 (*)    All other components within normal limits  COMPREHENSIVE METABOLIC PANEL - Abnormal; Notable for the following:    Sodium 128 (*)    Chloride 89 (*)    CO2 14 (*)    Glucose, Bld 395 (*)    BUN 29 (*)    Creatinine, Ser 1.34 (*)    Albumin 2.8 (*)    AST 14 (*)    ALT 12 (*)    Alkaline Phosphatase 137 (*)    Total Bilirubin 2.2 (*)    GFR calc non Af Amer 59 (*)    Anion gap 25 (*)    All other components within normal limits  BLOOD GAS,  VENOUS - Abnormal; Notable for the following:    pH, Ven 7.24 (*)    pCO2, Ven 30 (*)    Bicarbonate 12.9 (*)    Acid-base deficit 13.2 (*)    All other components within normal limits  URINALYSIS, COMPLETE (UACMP) WITH MICROSCOPIC - Abnormal; Notable for the following:    Color, Urine STRAW (*)    APPearance CLEAR (*)    Glucose, UA >=500 (*)    Hgb urine dipstick SMALL (*)    Ketones, ur 80 (*)    Protein, ur 30 (*)    Squamous Epithelial / LPF 0-5 (*)    All other components within normal limits  GLUCOSE, CAPILLARY - Abnormal; Notable for the following:    Glucose-Capillary 347 (*)    All other components within normal limits  GLUCOSE, CAPILLARY - Abnormal; Notable for the following:    Glucose-Capillary 290 (*)    All other components within normal limits  GLUCOSE, CAPILLARY - Abnormal; Notable for the following:    Glucose-Capillary 279 (*)    All other components within normal limits  CBG MONITORING, ED     ____________________________________________   EKG  None  ____________________________________________    RADIOLOGY  Left foot x-ray IMPRESSION:  Soft tissue wound without evidence of osteomyelitis.     ____________________________________________   PROCEDURES  Procedures  CRITICAL CARE Performed by: Nance Pear   Total critical care time: 35 minutes  Critical care time was exclusive of separately billable procedures and treating other patients.  Critical care was necessary to treat or prevent imminent or life-threatening deterioration.  Critical care was time spent personally by me on the following activities: development of treatment plan with patient and/or surrogate as well as nursing, discussions with consultants, evaluation of patient's response to treatment, examination of patient, obtaining history from patient or surrogate, ordering and performing treatments and interventions, ordering and review of laboratory studies, ordering and  review of radiographic studies, pulse oximetry and re-evaluation of patient's condition.  ____________________________________________   INITIAL IMPRESSION / ASSESSMENT AND PLAN / ED COURSE  Pertinent labs & imaging results that were available during my care of the patient were reviewed by me and considered in my medical decision making (see chart for details).  The patient presented to  the emergency department today because of concerns for diarrhea and left foot ulcer. His blood work did show an elevated sugar and anion gap. He was acidotic and there were ketones in his ear. Because of this concern was for diabetic ketoacidosis. I did get an x-ray of his left foot to evaluate for possible osteomyelitis however is negative. Patient was written for IV glucose as well as IV antibiotics for left foot ulcer. Patient will be admitted to the hospital service.  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 Any transcriptional errors that result from this process are unintentional     Nance Pear, MD 05/17/16 [phone]

## 2016-05-17 NOTE — Consult Note (Signed)
Pharmacy Antibiotic Note  Chase Clark is a 51 y.o. male admitted on 05/17/2016 with diabetic foot ulcer.  Pharmacy has been consulted for vancomycin dosing.  Plan: Pt received 1g on vancomycin in the ED. Will give next dose in 6 hours for stacked dosing Vancomycin 1000mg  IV every 12 hours.  Goal trough 15-20 mcg/mL.  Trough prior to the 5th dose 2/5 @ 1200  Height: 5\' 10"  (177.8 cm) Weight: 180 lb 12.4 oz (82 kg) IBW/kg (Calculated) : 73  Temp (24hrs), Avg:98 F (36.7 C), Min:97.4 F (36.3 C), Max:98.5 F (36.9 C)   Recent Labs Lab 05/17/16 1342  WBC 23.2*  CREATININE 1.34*    Estimated Creatinine Clearance: 66.6 mL/min (by C-G formula based on SCr of 1.34 mg/dL (H)).    Allergies  Allergen Reactions  . Atorvastatin     Myalgias   . Codeine Sulfate Nausea Only    Antimicrobials this admission: vancomycin 2/3 >>    Dose adjustments this admission:   Microbiology results:   Thank you for allowing pharmacy to be a part of this patient's care.  Ramond Dial, Pharm.D, BCPS Clinical Pharmacist  05/17/2016 8:40 PM

## 2016-05-17 NOTE — H&P (Signed)
History and Physical    Chase Clark AGT:[id number] DOB: April 12, 1966 DOA: 05/17/2016  Referring physician: Dr. Archie Balboa PCP: Kirk Ruths., MD  Specialists: Dr. Ubaldo Glassing  Chief Complaint: weakness with diarrhea  HPI: Chase Clark is a 51 y.o. male has a past medical history significant for CAD, DM, and chronic diarrhea after GB surgery now with worsening fatigue and malaise with nausea and worsening diarrhea. Sugars elevated at home. Minimal vomiting. No fever. Presents to ER in DKA with hyponatremia and dehydration. Left foot ulceration noted as well. WBC is elevated. He is now admitted. Denies CP or SOB. No bleeding.  Review of Systems: The patient denies  fever, weight loss,, vision loss, decreased hearing, hoarseness, chest pain, syncope, dyspnea on exertion, peripheral edema, balance deficits, hemoptysis, abdominal pain, melena, hematochezia, severe indigestion/heartburn, hematuria, incontinence, genital sores, muscle weakness, suspicious skin lesions, transient blindness, difficulty walking, depression, unusual weight change, abnormal bleeding, enlarged lymph nodes, angioedema, and breast masses.   Past Medical History:  Diagnosis Date  . Arthritis   . Asthma    as child  . Coronary artery disease   . Diabetes mellitus without complication ([place])   . Edema, lower extremity   . Fatty liver   . GERD (gastroesophageal reflux disease)   . Glaucoma   . Heart disease 2011   Patient has stent for 80% blockage  . Hyperlipidemia   . Hypertension   . Neuropathy ([place])   . Seasonal allergies   . Shoulder pain   . Ulcer of foot ([place]) 08.06.2014   DIABETIC ULCERATIONS ASSOCIATED WITH IRRITATION LATERAL ANKLE LEFT GREATER THAN RIGHT WITH MILD CELLULITIS   Past Surgical History:  Procedure Laterality Date  . CARDIAC CATHETERIZATION    . CATARACT EXTRACTION W/ INTRAOCULAR LENS IMPLANT    . CHOLECYSTECTOMY N/A   . CORONARY ANGIOPLASTY WITH STENT PLACEMENT  2007   1st diagonal  .  EPIBLEPHERON REPAIR WITH TEAR DUCT PROBING    . EYE SURGERY Right sept 29n2015   cataract extraction  . INSERTION EXPRESS TUBE SHUNT Right 09/06/2015   Procedure: INSERTION AHMED TUBE SHUNT with tutoplast allograft;  Surgeon: Eulogio Bear, MD;  Location: [facility] ORS;  Service: Ophthalmology;  Laterality: Right;  Marland Kitchen VASECTOMY     Social History:  reports that he has quit smoking. He has never used smokeless tobacco. He reports that he drinks alcohol. He reports that he does not use drugs.  Allergies  Allergen Reactions  . Atorvastatin     Myalgias   . Codeine Sulfate Nausea Only    Family History  Problem Relation Age of Onset  . Hyperlipidemia Mother   . Diabetes Mother   . Hyperlipidemia Father   . Diabetes Father   . Heart disease Father   . Hypertension Father     Prior to Admission medications   Medication Sig Start Date End Date Taking? Authorizing Provider  aspirin EC 81 MG tablet Take 81 mg by mouth daily.   Yes Historical Provider, MD  brimonidine-timolol (COMBIGAN) 0.2-0.5 % ophthalmic solution Place 1 drop into both eyes 2 (two) times daily.   Yes Historical Provider, MD  Difluprednate (DUREZOL) 0.05 % EMUL Apply 1 drop to eye at bedtime. Left eye   Yes Historical Provider, MD  esomeprazole (NEXIUM) 40 MG capsule Take 1 capsule (40 mg total) by mouth daily. Patient taking differently: Take 20 mg by mouth daily.  09/15/14  Yes [name], MD  gabapentin (NEURONTIN) 300 MG capsule Take 1 capsule (300 mg  total) by mouth 2 (two) times daily. Patient taking differently: Take 600 mg by mouth 2 (two) times daily.  03/15/15  Yes Trula Slade, DPM  insulin NPH Human (HUMULIN N,NOVOLIN N) 100 UNIT/ML injection Inject 100 Units into the skin 2 (two) times daily.   Yes Historical Provider, MD  insulin regular (NOVOLIN R,HUMULIN R) 100 units/mL injection Inject 50 Units into the skin 2 (two) times daily. Reported on 10/03/2015   Yes Historical Provider, MD  latanoprost  (XALATAN) 0.005 % ophthalmic solution Place 1 drop into both eyes at bedtime.   Yes Historical Provider, MD  lisinopril (PRINIVIL,ZESTRIL) 40 MG tablet Take 40 mg by mouth daily.  08/18/15  Yes Historical Provider, MD  losartan-hydrochlorothiazide (HYZAAR) 100-25 MG tablet Take 1 tablet by mouth daily. 07/15/15  Yes Historical Provider, MD  metFORMIN (GLUCOPHAGE) 1000 MG tablet TAKE 1 TABLET BY MOUTH TWICE A DAY 11/08/14  Yes [name], MD  NON FORMULARY Artifical tears   Yes Historical Provider, MD  aspirin EC 325 MG tablet Take 1 tablet (325 mg total) by mouth daily. Patient taking differently: Take 81 mg by mouth daily.  09/05/15   Varney Biles, MD  Canagliflozin (INVOKANA) 100 MG TABS Take 1 tablet (100 mg total) by mouth daily with breakfast. Patient not taking: Reported on 10/03/2015 01/19/14   Haydee Monica, MD  glucosamine-chondroitin 500-400 MG tablet Take 1 tablet by mouth daily.    Historical Provider, MD  glucose blood (BAYER CONTOUR NEXT TEST) test strip Check blood sugar tid Patient not taking: Reported on 10/03/2015 06/15/13   [name], MD  glucose blood test strip 1 each by Other route 3 (three) times daily. Use as instructed Patient not taking: Reported on 10/03/2015 06/13/13   [name], MD  insulin glargine (LANTUS) 100 UNIT/ML injection Inject 90 Units into the skin 2 (two) times daily.    Historical Provider, MD  insulin lispro (HUMALOG) 100 UNIT/ML injection Inject 60 Units into the skin 2 (two) times daily.    Historical Provider, MD  Multiple Vitamin (MULTIVITAMIN) tablet Take 1 tablet by mouth daily.    Historical Provider, MD   Physical Exam: Vitals:   05/17/16 1600 05/17/16 1630 05/17/16 1700 05/17/16 1730  BP: (!) 128/94 114/83 122/81 122/80  Pulse: 94 (!) 103 (!) 111 (!) 114  Resp:      Temp:      TempSrc:      SpO2: 99% 98% 100% 100%  Weight:      Height:         General:  No apparent distress, WDWN, [place]/AT  Eyes: PERRL, EOMI, no scleral icterus,  conjunctiva clear  ENT: moist oropharynx without exudate, TM's benign, dentition good  Neck: supple, no lymphadenopathy. No bruits or thyromegaly  Cardiovascular: regular rate without MRG; 2+ peripheral pulses, no JVD, no peripheral edema  Respiratory: CTA biL, good air movement without wheezing, rhonchi or crackled Respiratory effort normal  Abdomen: soft, non tender to palpation, positive bowel sounds, no guarding, no rebound  Skin: no rashes. 2X2 necrotic ulceration noted to left heel with surrounding erythema  Musculoskeletal: normal bulk and tone, no joint swelling  Psychiatric: normal mood and affect, A&OX3  Neurologic: CN 2-12 grossly intact, Motor strength 5/5 in all 4 groups with symmetric DTR's and stocking-glove neuropathy noted.  Labs on Admission:  Basic Metabolic Panel:  Recent Labs Lab 05/17/16 1342  NA 128*  K 4.2  CL 89*  CO2 14*  GLUCOSE 395*  BUN  29*  CREATININE 1.34*  CALCIUM 10.1   Liver Function Tests:  Recent Labs Lab 05/17/16 1342  AST 14*  ALT 12*  ALKPHOS 137*  BILITOT 2.2*  PROT 8.0  ALBUMIN 2.8*   No results for input(s): LIPASE, AMYLASE in the last 168 hours. No results for input(s): AMMONIA in the last 168 hours. CBC:  Recent Labs Lab 05/17/16 1342  WBC 23.2*  NEUTROABS 20.2*  HGB 12.7*  HCT 38.3*  MCV 90.4  PLT 425   Cardiac Enzymes: No results for input(s): CKTOTAL, CKMB, CKMBINDEX, TROPONINI in the last 168 hours.  BNP (last 3 results) No results for input(s): BNP in the last 8760 hours.  ProBNP (last 3 results) No results for input(s): PROBNP in the last 8760 hours.  CBG:  Recent Labs Lab 05/17/16 1550 05/17/16 1657 05/17/16 1736  GLUCAP 347* 290* 279*    Radiological Exams on Admission: Dg Foot Complete Left  Result Date: 05/17/2016 CLINICAL DATA:  Soft tissue ulcer over the calcaneus EXAM: LEFT FOOT - COMPLETE 3+ VIEW COMPARISON:  11/17/2012 FINDINGS: No acute fracture or dislocation is noted. The  soft tissue ulcer is noted. No underlying bony erosive changes are identified to suggest osteomyelitis. Vascular calcifications are seen. IMPRESSION: Soft tissue wound without evidence of osteomyelitis. Electronically Signed   By: Inez Catalina M.D.   On: 05/17/2016 17:32    EKG: Independently reviewed.  Assessment/Plan Principal Problem:   DKA (diabetic ketoacidoses) (HCC) Active Problems:   Ulcer of foot (HCC)   Chronic diarrhea   Hyponatremia   Will admit to [facility] with IV fluids and insulin drip. Follow sugars per protocol. Lomotil as needed. IV ABX for foot ulcer. Consult GI and Podiatry. Repeat labs in AM  Diet: low carb Fluids: NS@100  DVT Prophylaxis: SQ Heparin  Code Status: FULL  Family Communication: yes  Disposition Plan: home  Time spent: 55 min

## 2016-05-18 DIAGNOSIS — L97502 Non-pressure chronic ulcer of other part of unspecified foot with fat layer exposed: Secondary | ICD-10-CM

## 2016-05-18 LAB — COMPREHENSIVE METABOLIC PANEL
ALT: 9 U/L — ABNORMAL LOW (ref 17–63)
AST: 12 U/L — ABNORMAL LOW (ref 15–41)
Albumin: 2 g/dL — ABNORMAL LOW (ref 3.5–5.0)
Alkaline Phosphatase: 88 U/L (ref 38–126)
Anion gap: 8 (ref 5–15)
BUN: 19 mg/dL (ref 6–20)
CO2: 22 mmol/L (ref 22–32)
Calcium: 8.7 mg/dL — ABNORMAL LOW (ref 8.9–10.3)
Chloride: 101 mmol/L (ref 101–111)
Creatinine, Ser: 0.85 mg/dL (ref 0.61–1.24)
GFR calc Af Amer: >60 mL/min (ref >60)
GFR calc non Af Amer: >60 mL/min (ref >60)
Glucose, Bld: 153 mg/dL — ABNORMAL HIGH (ref 65–99)
Potassium: 3.3 mmol/L — ABNORMAL LOW (ref 3.5–5.1)
Sodium: 131 mmol/L — ABNORMAL LOW (ref 135–145)
Total Bilirubin: 0.9 mg/dL (ref 0.3–1.2)
Total Protein: 5.9 g/dL — ABNORMAL LOW (ref 6.5–8.1)

## 2016-05-18 LAB — CBC
HCT: 29.3 % — ABNORMAL LOW (ref 40.0–52.0)
Hemoglobin: 10.5 g/dL — ABNORMAL LOW (ref 13.0–18.0)
MCH: 30.9 pg (ref 26.0–34.0)
MCHC: 35.7 g/dL (ref 32.0–36.0)
MCV: 86.6 fL (ref 80.0–100.0)
Platelets: 309 10*3/uL (ref 150–440)
RBC: 3.39 MIL/uL — ABNORMAL LOW (ref 4.40–5.90)
RDW: 12.8 % (ref 11.5–14.5)
WBC: 15.8 10*3/uL — ABNORMAL HIGH (ref 3.8–10.6)

## 2016-05-18 LAB — GLUCOSE, CAPILLARY
Glucose-Capillary: 394 mg/dL — ABNORMAL HIGH (ref 65–99)
Glucose-Capillary: 173 mg/dL — ABNORMAL HIGH (ref 65–99)
Glucose-Capillary: 153 mg/dL — ABNORMAL HIGH (ref 65–99)
Glucose-Capillary: 159 mg/dL — ABNORMAL HIGH (ref 65–99)
Glucose-Capillary: 144 mg/dL — ABNORMAL HIGH (ref 65–99)
Glucose-Capillary: 131 mg/dL — ABNORMAL HIGH (ref 65–99)
Glucose-Capillary: 136 mg/dL — ABNORMAL HIGH (ref 65–99)
Glucose-Capillary: 137 mg/dL — ABNORMAL HIGH (ref 65–99)
Glucose-Capillary: 164 mg/dL — ABNORMAL HIGH (ref 65–99)
Glucose-Capillary: 172 mg/dL — ABNORMAL HIGH (ref 65–99)
Glucose-Capillary: 194 mg/dL — ABNORMAL HIGH (ref 65–99)
Glucose-Capillary: 195 mg/dL — ABNORMAL HIGH (ref 65–99)
Glucose-Capillary: 250 mg/dL — ABNORMAL HIGH (ref 65–99)
Glucose-Capillary: 196 mg/dL — ABNORMAL HIGH (ref 65–99)

## 2016-05-18 LAB — BASIC METABOLIC PANEL
Anion gap: 10 (ref 5–15)
Anion gap: 10 (ref 5–15)
BUN: 18 mg/dL (ref 6–20)
BUN: 20 mg/dL (ref 6–20)
CO2: 20 mmol/L — ABNORMAL LOW (ref 22–32)
CO2: 21 mmol/L — ABNORMAL LOW (ref 22–32)
Calcium: 8.7 mg/dL — ABNORMAL LOW (ref 8.9–10.3)
Calcium: 8.8 mg/dL — ABNORMAL LOW (ref 8.9–10.3)
Chloride: 100 mmol/L — ABNORMAL LOW (ref 101–111)
Chloride: 99 mmol/L — ABNORMAL LOW (ref 101–111)
Creatinine, Ser: 0.77 mg/dL (ref 0.61–1.24)
Creatinine, Ser: 1 mg/dL (ref 0.61–1.24)
GFR calc Af Amer: >60 mL/min (ref >60)
GFR calc Af Amer: >60 mL/min (ref >60)
GFR calc non Af Amer: >60 mL/min (ref >60)
GFR calc non Af Amer: >60 mL/min (ref >60)
Glucose, Bld: 205 mg/dL — ABNORMAL HIGH (ref 65–99)
Glucose, Bld: 216 mg/dL — ABNORMAL HIGH (ref 65–99)
Potassium: 3.1 mmol/L — ABNORMAL LOW (ref 3.5–5.1)
Potassium: 3.3 mmol/L — ABNORMAL LOW (ref 3.5–5.1)
Sodium: 130 mmol/L — ABNORMAL LOW (ref 135–145)
Sodium: 130 mmol/L — ABNORMAL LOW (ref 135–145)

## 2016-05-18 MED ORDER — INSULIN ASPART 100 UNIT/ML ~~LOC~~ SOLN
0.0000 [IU] | Freq: Three times a day (TID) | SUBCUTANEOUS | Status: DC
  Administered 2016-05-18: 3 [IU] via SUBCUTANEOUS
  Administered 2016-05-18: 2 [IU] via SUBCUTANEOUS
  Administered 2016-05-19 (×3): 3 [IU] via SUBCUTANEOUS
  Administered 2016-05-20 (×2): 2 [IU] via SUBCUTANEOUS
  Administered 2016-05-21 (×2): 3 [IU] via SUBCUTANEOUS
  Filled 2016-05-18 (×5): qty 3
  Filled 2016-05-18 (×2): qty 2
  Filled 2016-05-18: qty 3
  Filled 2016-05-18: qty 2

## 2016-05-18 MED ORDER — PIPERACILLIN-TAZOBACTAM 3.375 G IVPB
3.3750 g | Freq: Three times a day (TID) | INTRAVENOUS | Status: DC
  Administered 2016-05-18 – 2016-05-21 (×9): 3.375 g via INTRAVENOUS
  Filled 2016-05-18 (×8): qty 50

## 2016-05-18 MED ORDER — HYDRALAZINE HCL 20 MG/ML IJ SOLN
2.0000 mg | Freq: Once | INTRAMUSCULAR | Status: DC
Start: 2016-05-18 — End: 2016-05-21
  Filled 2016-05-18: qty 1

## 2016-05-18 MED ORDER — INSULIN ASPART 100 UNIT/ML ~~LOC~~ SOLN
0.0000 [IU] | Freq: Every day | SUBCUTANEOUS | Status: DC
  Administered 2016-05-19: 2 [IU] via SUBCUTANEOUS
  Filled 2016-05-18: qty 2

## 2016-05-18 MED ORDER — POTASSIUM CHLORIDE IN NACL 20-0.9 MEQ/L-% IV SOLN
INTRAVENOUS | Status: DC
  Administered 2016-05-18 (×2): via INTRAVENOUS
  Filled 2016-05-18 (×4): qty 1000

## 2016-05-18 MED ORDER — INSULIN DETEMIR 100 UNIT/ML ~~LOC~~ SOLN
20.0000 [IU] | Freq: Once | SUBCUTANEOUS | Status: AC
  Administered 2016-05-18: 20 [IU] via SUBCUTANEOUS
  Filled 2016-05-18: qty 0.2

## 2016-05-18 MED ORDER — HYDROCODONE-ACETAMINOPHEN 5-325 MG PO TABS
1.0000 | ORAL_TABLET | Freq: Four times a day (QID) | ORAL | Status: DC | PRN
  Administered 2016-05-18 – 2016-05-21 (×10): 1 via ORAL
  Filled 2016-05-18 (×10): qty 1

## 2016-05-18 MED ORDER — IBUPROFEN 400 MG PO TABS
400.0000 mg | ORAL_TABLET | Freq: Once | ORAL | Status: AC
  Administered 2016-05-18: 400 mg via ORAL
  Filled 2016-05-18: qty 1

## 2016-05-18 MED ORDER — VANCOMYCIN HCL IN DEXTROSE 1-5 GM/200ML-% IV SOLN
1000.0000 mg | Freq: Three times a day (TID) | INTRAVENOUS | Status: DC
  Administered 2016-05-18 – 2016-05-21 (×8): 1000 mg via INTRAVENOUS
  Filled 2016-05-18 (×12): qty 200

## 2016-05-18 NOTE — Consult Note (Signed)
Consult Note  Patient name: Chase Clark MRN: [id number] DOB: 26-Dec-1965 Sex: male  Consulting Physician:  Dr. Vickki Muff  Reason for Consult:  Chief Complaint  Patient presents with  . Diarrhea    HISTORY OF PRESENT ILLNESS: 51 year old male who presented with DKA and a diabetic left heel ulcer.  This was debrided at the bedside by Dr. Vickki Muff.  Vascular consult was requested.  The patient suffers from diabetes with recent elevation in blood sugars.  He has CAD and has previously undergone PCI.  He is on an ACE for HTN.  He has hypercholesterolemia but has a statin allergy.  He takes an ASA.Marland Kitchen  He is a former smoker.  Past Medical History:  Diagnosis Date  . Arthritis   . Asthma    as child  . Coronary artery disease   . Diabetes mellitus without complication ([place])   . Edema, lower extremity   . Fatty liver   . GERD (gastroesophageal reflux disease)   . Glaucoma   . Heart disease 2011   Patient has stent for 80% blockage  . Hyperlipidemia   . Hypertension   . Neuropathy ([place])   . Seasonal allergies   . Shoulder pain   . Ulcer of foot ([place]) 08.06.2014   DIABETIC ULCERATIONS ASSOCIATED WITH IRRITATION LATERAL ANKLE LEFT GREATER THAN RIGHT WITH MILD CELLULITIS    Past Surgical History:  Procedure Laterality Date  . CARDIAC CATHETERIZATION    . CATARACT EXTRACTION W/ INTRAOCULAR LENS IMPLANT    . CHOLECYSTECTOMY N/A   . CORONARY ANGIOPLASTY WITH STENT PLACEMENT  2007   1st diagonal  . EPIBLEPHERON REPAIR WITH TEAR DUCT PROBING    . EYE SURGERY Right sept 29n2015   cataract extraction  . INSERTION EXPRESS TUBE SHUNT Right 09/06/2015   Procedure: INSERTION AHMED TUBE SHUNT with tutoplast allograft;  Surgeon: Eulogio Bear, MD;  Location: [facility] ORS;  Service: Ophthalmology;  Laterality: Right;  Marland Kitchen VASECTOMY      Social History   Social History  . Marital status: Married    Spouse name: N/A  . Number of children: N/A  . Years of education: N/A    Occupational History  . Not on file.   Social History Main Topics  . Smoking status: Former Research scientist (life sciences)  . Smokeless tobacco: Never Used  . Alcohol use Yes  . Drug use: No  . Sexual activity: Not on file   Other Topics Concern  . Not on file   Social History Narrative  . No narrative on file    Family History  Problem Relation Age of Onset  . Hyperlipidemia Mother   . Diabetes Mother   . Hyperlipidemia Father   . Diabetes Father   . Heart disease Father   . Hypertension Father     Allergies as of 05/17/2016 - Review Complete 05/17/2016  Allergen Reaction Noted  . Atorvastatin  01/14/2014  . Codeine sulfate Nausea Only 01/12/2014    No current facility-administered medications on file prior to encounter.    Current Outpatient Prescriptions on File Prior to Encounter  Medication Sig Dispense Refill  . Difluprednate (DUREZOL) 0.05 % EMUL Apply 1 drop to eye at bedtime. Left eye    . esomeprazole (NEXIUM) 40 MG capsule Take 1 capsule (40 mg total) by mouth daily. (Patient taking differently: Take 20 mg by mouth daily. ) 90 capsule 0  . gabapentin (NEURONTIN) 300 MG capsule Take 1 capsule (300 mg total) by  mouth 2 (two) times daily. (Patient taking differently: Take 600 mg by mouth 2 (two) times daily. ) 180 capsule 3  . insulin regular (NOVOLIN R,HUMULIN R) 100 units/mL injection Inject 50 Units into the skin 2 (two) times daily. Reported on 10/03/2015    . lisinopril (PRINIVIL,ZESTRIL) 40 MG tablet Take 40 mg by mouth daily.   0  . losartan-hydrochlorothiazide (HYZAAR) 100-25 MG tablet Take 1 tablet by mouth daily.  11  . metFORMIN (GLUCOPHAGE) 1000 MG tablet TAKE 1 TABLET BY MOUTH TWICE A DAY 180 tablet 0  . NON FORMULARY Artifical tears    . aspirin EC 325 MG tablet Take 1 tablet (325 mg total) by mouth daily. (Patient taking differently: Take 81 mg by mouth daily. ) 30 tablet 0  . Canagliflozin (INVOKANA) 100 MG TABS Take 1 tablet (100 mg total) by mouth daily with  breakfast. (Patient not taking: Reported on 10/03/2015) 30 tablet 6  . glucosamine-chondroitin 500-400 MG tablet Take 1 tablet by mouth daily.    Marland Kitchen glucose blood (BAYER CONTOUR NEXT TEST) test strip Check blood sugar tid (Patient not taking: Reported on 10/03/2015) 100 each 5  . glucose blood test strip 1 each by Other route 3 (three) times daily. Use as instructed (Patient not taking: Reported on 10/03/2015) 100 each 12  . insulin glargine (LANTUS) 100 UNIT/ML injection Inject 90 Units into the skin 2 (two) times daily.    . insulin lispro (HUMALOG) 100 UNIT/ML injection Inject 60 Units into the skin 2 (two) times daily.    . Multiple Vitamin (MULTIVITAMIN) tablet Take 1 tablet by mouth daily.       REVIEW OF SYSTEMS: See HPI, no changes  PHYSICAL EXAMINATION: General: The patient appears their stated age.  Vital signs are BP (!) 172/91 (BP Location: Left Arm)   Pulse (!) 110   Temp 99.2 F (37.3 C) (Axillary)   Resp (!) 21   Ht 5\' 10"  (1.778 m)   Wt 180 lb 12.4 oz (82 kg)   SpO2 93%   BMI 25.94 kg/m  Pulmonary: Respirations are non-labored HEENT:  No gross abnormalities Abdomen: Soft and non-tender  Musculoskeletal: There are no major deformities.   Neurologic: No focal weakness or paresthesias are detected, Skin: left heel ulcer with drainage Psychiatric: The patient has normal affect. Cardiovascular: There is a regular rate and rhythm.  Palpable left DP.  I can not palpate a PT    Assessment:  Left diabetic heel ulcer Plan: Discussed limb threatening nature of the heel ulcer with the patient and his wife.  Will consider angiography to determine if there are any options to improve blood flow to optimize chances of wound healing.  Continue broad spectrum ABx.  Dr. Delana Meyer to evaluate tomorrow     V. Leia Alf, M.D. [facility] Office: [phone] Pager:  [phone]

## 2016-05-18 NOTE — Consult Note (Signed)
ORTHOPAEDIC CONSULTATION  REQUESTING PHYSICIAN: Epifanio Lesches, MD  Chief Complaint: Heel ulcer  HPI: Chase Clark is a 51 y.o. male who complains of  Left heel ulcer.  Remembers pulling piece of skin from posterior left heel recently and developed ulcer thereafter.  Noted neuropathy.  State BS has been elevated.  Followed by Dr. Ouida Sills.   Past Medical History:  Diagnosis Date  . Arthritis   . Asthma    as child  . Coronary artery disease   . Diabetes mellitus without complication ([place])   . Edema, lower extremity   . Fatty liver   . GERD (gastroesophageal reflux disease)   . Glaucoma   . Heart disease 2011   Patient has stent for 80% blockage  . Hyperlipidemia   . Hypertension   . Neuropathy ([place])   . Seasonal allergies   . Shoulder pain   . Ulcer of foot ([place]) 08.06.2014   DIABETIC ULCERATIONS ASSOCIATED WITH IRRITATION LATERAL ANKLE LEFT GREATER THAN RIGHT WITH MILD CELLULITIS   Past Surgical History:  Procedure Laterality Date  . CARDIAC CATHETERIZATION    . CATARACT EXTRACTION W/ INTRAOCULAR LENS IMPLANT    . CHOLECYSTECTOMY N/A   . CORONARY ANGIOPLASTY WITH STENT PLACEMENT  2007   1st diagonal  . EPIBLEPHERON REPAIR WITH TEAR DUCT PROBING    . EYE SURGERY Right sept 29n2015   cataract extraction  . INSERTION EXPRESS TUBE SHUNT Right 09/06/2015   Procedure: INSERTION AHMED TUBE SHUNT with tutoplast allograft;  Surgeon: Eulogio Bear, MD;  Location: [facility] ORS;  Service: Ophthalmology;  Laterality: Right;  Marland Kitchen VASECTOMY     Social History   Social History  . Marital status: Married    Spouse name: N/A  . Number of children: N/A  . Years of education: N/A   Social History Main Topics  . Smoking status: Former Research scientist (life sciences)  . Smokeless tobacco: Never Used  . Alcohol use Yes  . Drug use: No  . Sexual activity: Not Asked   Other Topics Concern  . None   Social History Narrative  . None   Family History  Problem Relation Age of Onset  .  Hyperlipidemia Mother   . Diabetes Mother   . Hyperlipidemia Father   . Diabetes Father   . Heart disease Father   . Hypertension Father    Allergies  Allergen Reactions  . Atorvastatin     Myalgias   . Codeine Sulfate Nausea Only   Prior to Admission medications   Medication Sig Start Date End Date Taking? Authorizing Provider  aspirin EC 81 MG tablet Take 81 mg by mouth daily.   Yes Historical Provider, MD  brimonidine-timolol (COMBIGAN) 0.2-0.5 % ophthalmic solution Place 1 drop into both eyes 2 (two) times daily.   Yes Historical Provider, MD  Difluprednate (DUREZOL) 0.05 % EMUL Apply 1 drop to eye at bedtime. Left eye   Yes Historical Provider, MD  esomeprazole (NEXIUM) 40 MG capsule Take 1 capsule (40 mg total) by mouth daily. Patient taking differently: Take 20 mg by mouth daily.  09/15/14  Yes [name], MD  gabapentin (NEURONTIN) 300 MG capsule Take 1 capsule (300 mg total) by mouth 2 (two) times daily. Patient taking differently: Take 600 mg by mouth 2 (two) times daily.  03/15/15  Yes Trula Slade, DPM  insulin NPH Human (HUMULIN N,NOVOLIN N) 100 UNIT/ML injection Inject 100 Units into the skin 2 (two) times daily.   Yes Historical Provider, MD  insulin regular (NOVOLIN  R,HUMULIN R) 100 units/mL injection Inject 50 Units into the skin 2 (two) times daily. Reported on 10/03/2015   Yes Historical Provider, MD  latanoprost (XALATAN) 0.005 % ophthalmic solution Place 1 drop into both eyes at bedtime.   Yes Historical Provider, MD  lisinopril (PRINIVIL,ZESTRIL) 40 MG tablet Take 40 mg by mouth daily.  08/18/15  Yes Historical Provider, MD  losartan-hydrochlorothiazide (HYZAAR) 100-25 MG tablet Take 1 tablet by mouth daily. 07/15/15  Yes Historical Provider, MD  metFORMIN (GLUCOPHAGE) 1000 MG tablet TAKE 1 TABLET BY MOUTH TWICE A DAY 11/08/14  Yes [name], MD  NON FORMULARY Artifical tears   Yes Historical Provider, MD  aspirin EC 325 MG tablet Take 1 tablet (325 mg total)  by mouth daily. Patient taking differently: Take 81 mg by mouth daily.  09/05/15   Varney Biles, MD  Canagliflozin (INVOKANA) 100 MG TABS Take 1 tablet (100 mg total) by mouth daily with breakfast. Patient not taking: Reported on 10/03/2015 01/19/14   Haydee Monica, MD  glucosamine-chondroitin 500-400 MG tablet Take 1 tablet by mouth daily.    Historical Provider, MD  glucose blood (BAYER CONTOUR NEXT TEST) test strip Check blood sugar tid Patient not taking: Reported on 10/03/2015 06/15/13   [name], MD  glucose blood test strip 1 each by Other route 3 (three) times daily. Use as instructed Patient not taking: Reported on 10/03/2015 06/13/13   [name], MD  insulin glargine (LANTUS) 100 UNIT/ML injection Inject 90 Units into the skin 2 (two) times daily.    Historical Provider, MD  insulin lispro (HUMALOG) 100 UNIT/ML injection Inject 60 Units into the skin 2 (two) times daily.    Historical Provider, MD  Multiple Vitamin (MULTIVITAMIN) tablet Take 1 tablet by mouth daily.    Historical Provider, MD   Dg Foot Complete Left  Result Date: 05/17/2016 CLINICAL DATA:  Soft tissue ulcer over the calcaneus EXAM: LEFT FOOT - COMPLETE 3+ VIEW COMPARISON:  11/17/2012 FINDINGS: No acute fracture or dislocation is noted. The soft tissue ulcer is noted. No underlying bony erosive changes are identified to suggest osteomyelitis. Vascular calcifications are seen. IMPRESSION: Soft tissue wound without evidence of osteomyelitis. Electronically Signed   By: Inez Catalina M.D.   On: 05/17/2016 17:32    Positive ROS: All other systems have been reviewed and were otherwise negative with the exception of those mentioned in the HPI and as above.  12 point ROS was performed.  Physical Exam: General: Alert and oriented.  No apparent distress.  Vascular:  Left foot:Dorsalis Pedis:  present Posterior Tibial:  absent  Right foot: Dorsalis Pedis:  present Posterior Tibial:  absent  Neuro:absent protective  sensation  Derm:Right foot clear. Left heel with posterior necrotic eschar with surrounding dark dusky skin and erythema.  No purulence noted.Superficial skin slough removed.  Did not probe deep at this time.  Some boggyness of eschar.   Necrotic tissue measures ~3 cm with surrounding dusky region to medial and lateral heel.  Ortho/MS: Moderate edema to left foot.  No pain with ROM   Assessment: Posterior heel decubitus ulcer. Diabetes with neuropathy.  Plan: Superficial skin selectively debided at bedside to necrotic eschar with skin blade. Debridement limited to superficial epidermis to dermis.  Pt has palpable DP but no palpable PT and with DM recommend vascular consult.  MRI ordered to evaluate ulceration and calcaneus.  Padded heel dressing applied and orders for dressing written.  Apply Allevyn heel dressing and change 3  x's per week or as needed.  No obvious infection from heel and no culture performed at this time.    Elesa Hacker, DPM Cell [phone]   05/18/2016 10:00 AM

## 2016-05-18 NOTE — Progress Notes (Signed)
Per [name] order 20 units lantus per DKA protocol, order noroco, pt states he does not have true allergy to codeine

## 2016-05-18 NOTE — Consult Note (Signed)
Gastroenterology Consultation  Referring Provider:     No ref. provider found Primary Care Physician:  Kirk Ruths., MD Primary Gastroenterologist:  None         Reason for Consultation:     Chronic diarrhea  Date of Admission:  05/17/2016 Date of Consultation:  05/18/2016         HPI:   Chase Clark is a 51 y.o. male admitted with DKA after gastroenteritis about a week ago. His DKA is mostly resolved. He has h/o chronic diarrhea since cholecystectomy in 07/2011. GI consulted for chronic diarrhea. He has been having 3-4 non bloody loose stools within 1 hr after eating a/w urgency. This has not gotten worse until a 1week ago when he had 1 episode of emesis and worsening diarrhea. He was visiting his sick mom in past week who is in the hospital. He reports not having a bowel movement since admission. He has been NPO. He has not tried any medications for chronic loose bowels.  He denies abdominal pain/n/v/melena/hematochezia/constipation/dysphagia/weight loss. He had colonoscopy in 2013 and it was normal. He denies smoking or ETOH.   Past Medical History:  Diagnosis Date  . Arthritis   . Asthma    as child  . Coronary artery disease   . Diabetes mellitus without complication ([place])   . Edema, lower extremity   . Fatty liver   . GERD (gastroesophageal reflux disease)   . Glaucoma   . Heart disease 2011   Patient has stent for 80% blockage  . Hyperlipidemia   . Hypertension   . Neuropathy ([place])   . Seasonal allergies   . Shoulder pain   . Ulcer of foot ([place]) 08.06.2014   DIABETIC ULCERATIONS ASSOCIATED WITH IRRITATION LATERAL ANKLE LEFT GREATER THAN RIGHT WITH MILD CELLULITIS    Past Surgical History:  Procedure Laterality Date  . CARDIAC CATHETERIZATION    . CATARACT EXTRACTION W/ INTRAOCULAR LENS IMPLANT    . CHOLECYSTECTOMY N/A   . CORONARY ANGIOPLASTY WITH STENT PLACEMENT  2007   1st diagonal  . EPIBLEPHERON REPAIR WITH TEAR DUCT PROBING    . EYE SURGERY Right  sept 29n2015   cataract extraction  . INSERTION EXPRESS TUBE SHUNT Right 09/06/2015   Procedure: INSERTION AHMED TUBE SHUNT with tutoplast allograft;  Surgeon: Eulogio Bear, MD;  Location: [facility] ORS;  Service: Ophthalmology;  Laterality: Right;  Marland Kitchen VASECTOMY      Prior to Admission medications   Medication Sig Start Date End Date Taking? Authorizing Provider  aspirin EC 81 MG tablet Take 81 mg by mouth daily.   Yes Historical Provider, MD  brimonidine-timolol (COMBIGAN) 0.2-0.5 % ophthalmic solution Place 1 drop into both eyes 2 (two) times daily.   Yes Historical Provider, MD  Difluprednate (DUREZOL) 0.05 % EMUL Apply 1 drop to eye at bedtime. Left eye   Yes Historical Provider, MD  esomeprazole (NEXIUM) 40 MG capsule Take 1 capsule (40 mg total) by mouth daily. Patient taking differently: Take 20 mg by mouth daily.  09/15/14  Yes [name], MD  gabapentin (NEURONTIN) 300 MG capsule Take 1 capsule (300 mg total) by mouth 2 (two) times daily. Patient taking differently: Take 600 mg by mouth 2 (two) times daily.  03/15/15  Yes Trula Slade, DPM  insulin NPH Human (HUMULIN N,NOVOLIN N) 100 UNIT/ML injection Inject 100 Units into the skin 2 (two) times daily.   Yes Historical Provider, MD  insulin regular (NOVOLIN R,HUMULIN R) 100 units/mL injection Inject 50  Units into the skin 2 (two) times daily. Reported on 10/03/2015   Yes Historical Provider, MD  latanoprost (XALATAN) 0.005 % ophthalmic solution Place 1 drop into both eyes at bedtime.   Yes Historical Provider, MD  lisinopril (PRINIVIL,ZESTRIL) 40 MG tablet Take 40 mg by mouth daily.  08/18/15  Yes Historical Provider, MD  losartan-hydrochlorothiazide (HYZAAR) 100-25 MG tablet Take 1 tablet by mouth daily. 07/15/15  Yes Historical Provider, MD  metFORMIN (GLUCOPHAGE) 1000 MG tablet TAKE 1 TABLET BY MOUTH TWICE A DAY 11/08/14  Yes [name], MD  NON FORMULARY Artifical tears   Yes Historical Provider, MD  aspirin EC 325 MG tablet  Take 1 tablet (325 mg total) by mouth daily. Patient taking differently: Take 81 mg by mouth daily.  09/05/15   Varney Biles, MD  Canagliflozin (INVOKANA) 100 MG TABS Take 1 tablet (100 mg total) by mouth daily with breakfast. Patient not taking: Reported on 10/03/2015 01/19/14   Haydee Monica, MD  glucosamine-chondroitin 500-400 MG tablet Take 1 tablet by mouth daily.    Historical Provider, MD  glucose blood (BAYER CONTOUR NEXT TEST) test strip Check blood sugar tid Patient not taking: Reported on 10/03/2015 06/15/13   [name], MD  glucose blood test strip 1 each by Other route 3 (three) times daily. Use as instructed Patient not taking: Reported on 10/03/2015 06/13/13   [name], MD  insulin glargine (LANTUS) 100 UNIT/ML injection Inject 90 Units into the skin 2 (two) times daily.    Historical Provider, MD  insulin lispro (HUMALOG) 100 UNIT/ML injection Inject 60 Units into the skin 2 (two) times daily.    Historical Provider, MD  Multiple Vitamin (MULTIVITAMIN) tablet Take 1 tablet by mouth daily.    Historical Provider, MD    Family History  Problem Relation Age of Onset  . Hyperlipidemia Mother   . Diabetes Mother   . Hyperlipidemia Father   . Diabetes Father   . Heart disease Father   . Hypertension Father      Social History  Substance Use Topics  . Smoking status: Former Research scientist (life sciences)  . Smokeless tobacco: Never Used  . Alcohol use Yes    Allergies as of 05/17/2016 - Review Complete 05/17/2016  Allergen Reaction Noted  . Atorvastatin  01/14/2014  . Codeine sulfate Nausea Only 01/12/2014    Review of Systems:    All systems reviewed and negative except where noted in HPI.   Physical Exam:  Vital signs in last 24 hours: Temp:  [97.4 F (36.3 C)-100 F (37.8 C)] 97.8 F (36.6 C) (02/04 0800) Pulse Rate:  [80-114] 92 (02/04 0800) Resp:  [12-28] 21 (02/04 0900) BP: (87-143)/(56-94) 127/71 (02/04 0900) SpO2:  [90 %-100 %] 96 % (02/04 0800) Weight:  [82 kg (180  lb 12.4 oz)-88.5 kg (195 lb)] 82 kg (180 lb 12.4 oz) (02/04 0300) Last BM Date: 05/17/16 General:   Pleasant, cooperative in NAD Head:  Normocephalic and atraumatic. Eyes:   No icterus.   Conjunctiva pink. PERRLA. Ears:  Normal auditory acuity. Neck:  Supple; no masses or thyroidomegaly Lungs: Respirations even and unlabored. Lungs clear to auscultation bilaterally.   No wheezes, crackles, or rhonchi.  Heart:  Regular rate and rhythm;  Without murmur, clicks, rubs or gallops Abdomen:  Soft, nondistended, nontender. Normal bowel sounds. No appreciable masses or hepatomegaly.  No rebound or guarding.  Rectal:  Not performed. Msk:  Symmetrical without gross deformities.   Extremities:  Without edema, cyanosis or clubbing.  Neurologic:  Alert and oriented x3;  grossly normal neurologically. Skin:  Intact without significant lesions or rashes. Cervical Nodes:  No significant cervical adenopathy. Psych:  Alert and cooperative. Normal affect.  LAB RESULTS:  Recent Labs  05/17/16 1342 05/18/16 0506  WBC 23.2* 15.8*  HGB 12.7* 10.5*  HCT 38.3* 29.3*  PLT 425 309   BMET  Recent Labs  05/17/16 2051 05/18/16 0003 05/18/16 0506  NA 130* 130* 131*  K 3.6 3.3* 3.3*  CL 97* 100* 101  CO2 15* 20* 22  GLUCOSE 285* 205* 153*  BUN 22* 20 19  CREATININE 1.15 1.00 0.85  CALCIUM 9.3 8.8* 8.7*   LFT  Recent Labs  05/18/16 0506  PROT 5.9*  ALBUMIN 2.0*  AST 12*  ALT 9*  ALKPHOS 88  BILITOT 0.9   PT/INR No results for input(s): LABPROT, INR in the last 72 hours.  STUDIES: Dg Foot Complete Left  Result Date: 05/17/2016 CLINICAL DATA:  Soft tissue ulcer over the calcaneus EXAM: LEFT FOOT - COMPLETE 3+ VIEW COMPARISON:  11/17/2012 FINDINGS: No acute fracture or dislocation is noted. The soft tissue ulcer is noted. No underlying bony erosive changes are identified to suggest osteomyelitis. Vascular calcifications are seen. IMPRESSION: Soft tissue wound without evidence of  osteomyelitis. Electronically Signed   By: Inez Catalina M.D.   On: 05/17/2016 17:32      Impression / Plan:   Chase Clark is a 51 y.o. y/o male with chronic non bloody diarrhea post cholecystectomy since 2013. Likely bile salt diarrhea.   - Recommend cholestyramine 1gm packet 1-2 times daily if diarrhea recurs after he resumes his diet - If diarrhea persists despite cholestyramine BID, add imodium - Uncontrolled diabetes may be also contributing to his chronic diarrhea  Thank you for involving me in the care of this patient. Please call us back with questions    LOS: 1 day   Sherri Sear, MD  05/18/2016, 11:05 AM

## 2016-05-18 NOTE — Progress Notes (Signed)
[facility] [place] NAME: Chase Clark    MR#:  [id number]  DATE OF BIRTH:  [birth year]  SUBJECTIVE: Patient admitted for DKA, started on insulin drip, and anion gap  is closed,. still feels weak and tired but no nausea or vomiting. Did not see primary doctor for almost 1 year. Getting insulin from [facility].   CHIEF COMPLAINT:   Chief Complaint  Patient presents with  . Diarrhea    REVIEW OF SYSTEMS:   ROS CONSTITUTIONAL: No fever, fatigue or weakness.  EYES: No blurred or double vision.  EARS, NOSE, AND THROAT: No tinnitus or ear pain.  RESPIRATORY: No cough, shortness of breath, wheezing or hemoptysis.  CARDIOVASCULAR: No chest pain, orthopnea, edema.  GASTROINTESTINAL: No nausea, vomiting, diarrhea or abdominal pain.  GENITOURINARY: No dysuria, hematuria.  ENDOCRINE: No polyuria, nocturia,  HEMATOLOGY: No anemia, easy bruising or bleeding SKIN: No rash or lesion. MUSCULOSKELETAL:ulcer on left foot NEUROLOGIC: No tingling, numbness, weakness.  PSYCHIATRY: No anxiety or depression.   DRUG ALLERGIES:   Allergies  Allergen Reactions  . Atorvastatin     Myalgias   . Codeine Sulfate Nausea Only    VITALS:  Blood pressure 127/71, pulse 92, temperature 97.8 F (36.6 C), temperature source Axillary, resp. rate (!) 21, height 5\' 10"  (1.778 m), weight 82 kg (180 lb 12.4 oz), SpO2 96 %.  PHYSICAL EXAMINATION:  GENERAL:  51 y.o.-year-old patient lying in the bed with no acute distress.  EYES: Pupils equal, round, reactive to light and accommodation. No scleral icterus. Extraocular muscles intact.  HEENT: Head atraumatic, normocephalic. Oropharynx and nasopharynx clear.  NECK:  Supple, no jugular venous distention. No thyroid enlargement, no tenderness.  LUNGS: Normal breath sounds bilaterally, no wheezing, rales,rhonchi or crepitation. No use of accessory muscles of respiration.  CARDIOVASCULAR: S1, S2 normal. No murmurs,  rubs, or gallops.  ABDOMEN: Soft, nontender, nondistended. Bowel sounds present. No organomegaly or mass.  EXTREMITIES: Left heel with posterior necrotic eschar with surrounding dark dusky skin and erythema.right leg clear. NEUROLOGIC: Cranial nerves II through XII are intact. Muscle strength 5/5 in all extremities. Sensation intact. Gait not checked.  PSYCHIATRIC: The patient is alert and oriented x 3.  SKIN: No obvious rash, lesion, or ulcer.    LABORATORY PANEL:   CBC  Recent Labs Lab 05/18/16 0506  WBC 15.8*  HGB 10.5*  HCT 29.3*  PLT 309   ------------------------------------------------------------------------------------------------------------------  Chemistries   Recent Labs Lab 05/18/16 0506  NA 131*  K 3.3*  CL 101  CO2 22  GLUCOSE 153*  BUN 19  CREATININE 0.85  CALCIUM 8.7*  AST 12*  ALT 9*  ALKPHOS 88  BILITOT 0.9   ------------------------------------------------------------------------------------------------------------------  Cardiac Enzymes No results for input(s): TROPONINI in the last 168 hours. ------------------------------------------------------------------------------------------------------------------  RADIOLOGY:  Dg Foot Complete Left  Result Date: 05/17/2016 CLINICAL DATA:  Soft tissue ulcer over the calcaneus EXAM: LEFT FOOT - COMPLETE 3+ VIEW COMPARISON:  11/17/2012 FINDINGS: No acute fracture or dislocation is noted. The soft tissue ulcer is noted. No underlying bony erosive changes are identified to suggest osteomyelitis. Vascular calcifications are seen. IMPRESSION: Soft tissue wound without evidence of osteomyelitis. Electronically Signed   By: Inez Catalina M.D.   On: 05/17/2016 17:32    EKG:   Orders placed or performed during the hospital encounter of 09/04/15  . ED EKG  . ED EKG  . EKG 12-Lead  . EKG 12-Lead  . EKG    ASSESSMENT AND  PLAN:   1 DKA: Resolved:  Off insulin drip. Started on low-dose Lantus, insulin. He  told me that he is not taking Lantus anymore, he takes insulin from [place]. Consult diabetes coordinator, check hemoglobin A1c, a transfer him out of ICU but continue IV fluids. 2 left heel ulcer secondary to diabetes mellitus type 2: Seen by podiatry, has sepsis on admission secondary to this necrotic diabetic foot ulcer: Continue antibiotics, IV hydration, follow blood cultures, wound cultures. RA of the left fourth, vascular consult.  #3. Neuropathy; continue Neurontin  #4 hyponatremia is improving: GI , DVT prophylaxis All the records are reviewed and case discussed with Care Management/Social Workerr. Management plans discussed with the patient, family and they are in agreement.  CODE STATUS: full  TOTAL TIME TAKING CARE OF THIS PATIENT: 31minutes.            POSSIBLE D/C IN 1-2 DAYS, DEPENDING ON CLINICAL CONDITION.   Epifanio Lesches M.D on 05/18/2016 at 11:20 AM  Between 7am to 6pm - Pager - [phone]  After 6pm go to [email or web address] - password [email or web address] [facility]  Office  [phone]  CC: Primary care physician; Kirk Ruths., MD   Note: This dictation was prepared with Dragon dictation along with smaller phrase technology. Any transcriptional errors that result from this process are unintentional.

## 2016-05-18 NOTE — Consult Note (Addendum)
Pharmacy Antibiotic Note  Chase Clark is a 51 y.o. male admitted on 05/17/2016 with diabetic foot ulcer.  Pharmacy has been consulted for vancomycin and zosyn dosing.  Plan: Pt received 1g on vancomycin in the ED. Will give next dose in 6 hours for stacked dosing Vancomycin 1000mg  IV every 12 hours.  Goal trough 15-20 mcg/mL.  Trough prior to the 5th dose 2/5 @ 1200  2/4:  Scr 0.77 Crcl improved.  Will transition to Vancomycin 1 gram IV q8h.   Ke 0.097,  T1/2 7.15,  Vd 57.4  Trough prior to 4th dose of new regimen on 2/5.  Will order Zosyn EI 3.375gm IV q8h.      Height: 5\' 10"  (177.8 cm) Weight: 180 lb 12.4 oz (82 kg) IBW/kg (Calculated) : 73  Temp (24hrs), Avg:98.6 F (37 C), Min:97.4 F (36.3 C), Max:100 F (37.8 C)   Recent Labs Lab 05/17/16 1342 05/17/16 2051 05/18/16 0003 05/18/16 0506 05/18/16 1112  WBC 23.2*  --   --  15.8*  --   CREATININE 1.34* 1.15 1.00 0.85 0.77    Estimated Creatinine Clearance: 111.5 mL/min (by C-G formula based on SCr of 0.77 mg/dL).    Allergies  Allergen Reactions  . Atorvastatin     Myalgias   . Codeine Sulfate Nausea Only    Antimicrobials this admission: vancomycin 2/3 >>    Dose adjustments this admission:   Microbiology results:   Thank you for allowing pharmacy to be a part of this patient's care.  Chinita Greenland PharmD Clinical Pharmacist 05/18/2016

## 2016-05-18 NOTE — Progress Notes (Signed)
Per [name] pt can transfer to 2A after 1600, ICU to observe him until then for s/sx hypo/hyperglycemia.  Lantus administered at 0930, insulin infusion discontinued at 1130.  SSI started at lunch.

## 2016-05-19 ENCOUNTER — Telehealth: Payer: Self-pay | Admitting: Internal Medicine

## 2016-05-19 DIAGNOSIS — L97509 Non-pressure chronic ulcer of other part of unspecified foot with unspecified severity: Secondary | ICD-10-CM

## 2016-05-19 DIAGNOSIS — E11621 Type 2 diabetes mellitus with foot ulcer: Secondary | ICD-10-CM

## 2016-05-19 LAB — GLUCOSE, CAPILLARY
Glucose-Capillary: 240 mg/dL — ABNORMAL HIGH (ref 65–99)
Glucose-Capillary: 218 mg/dL — ABNORMAL HIGH (ref 65–99)
Glucose-Capillary: 230 mg/dL — ABNORMAL HIGH (ref 65–99)
Glucose-Capillary: 228 mg/dL — ABNORMAL HIGH (ref 65–99)
Glucose-Capillary: 212 mg/dL — ABNORMAL HIGH (ref 65–99)

## 2016-05-19 LAB — BASIC METABOLIC PANEL
Anion gap: 8 (ref 5–15)
BUN: 12 mg/dL (ref 6–20)
CO2: 23 mmol/L (ref 22–32)
Calcium: 8.8 mg/dL — ABNORMAL LOW (ref 8.9–10.3)
Chloride: 99 mmol/L — ABNORMAL LOW (ref 101–111)
Creatinine, Ser: 0.69 mg/dL (ref 0.61–1.24)
GFR calc Af Amer: >60 mL/min (ref >60)
GFR calc non Af Amer: >60 mL/min (ref >60)
Glucose, Bld: 263 mg/dL — ABNORMAL HIGH (ref 65–99)
Potassium: 3.3 mmol/L — ABNORMAL LOW (ref 3.5–5.1)
Sodium: 130 mmol/L — ABNORMAL LOW (ref 135–145)

## 2016-05-19 LAB — CBC
HCT: 29.6 % — ABNORMAL LOW (ref 40.0–52.0)
Hemoglobin: 10.2 g/dL — ABNORMAL LOW (ref 13.0–18.0)
MCH: 30.2 pg (ref 26.0–34.0)
MCHC: 34.7 g/dL (ref 32.0–36.0)
MCV: 87.2 fL (ref 80.0–100.0)
Platelets: 324 10*3/uL (ref 150–440)
RBC: 3.39 MIL/uL — ABNORMAL LOW (ref 4.40–5.90)
RDW: 12.9 % (ref 11.5–14.5)
WBC: 16.6 10*3/uL — ABNORMAL HIGH (ref 3.8–10.6)

## 2016-05-19 LAB — INFLUENZA PANEL BY PCR (TYPE A & B)
Influenza A By PCR: NEGATIVE
Influenza B By PCR: NEGATIVE

## 2016-05-19 LAB — BLOOD GAS, VENOUS
Acid-base deficit: 13.2 mmol/L — ABNORMAL HIGH (ref 0.0–2.0)
Bicarbonate: 12.9 mmol/L — ABNORMAL LOW (ref 20.0–28.0)
O2 Saturation: 55 %
Patient temperature: 37
pCO2, Ven: 30 mmHg — ABNORMAL LOW (ref 44.0–60.0)
pH, Ven: 7.24 — ABNORMAL LOW (ref 7.250–7.430)
pO2, Ven: 35 mmHg (ref 32.0–45.0)

## 2016-05-19 LAB — VANCOMYCIN, TROUGH: Vancomycin Tr: 16 ug/mL (ref 15–20)

## 2016-05-19 MED ORDER — INSULIN GLARGINE 100 UNIT/ML ~~LOC~~ SOLN
25.0000 [IU] | Freq: Every day | SUBCUTANEOUS | Status: DC
Start: 2016-05-19 — End: 2016-05-20
  Administered 2016-05-19: 25 [IU] via SUBCUTANEOUS
  Administered 2016-05-20: 10 [IU] via SUBCUTANEOUS
  Filled 2016-05-19 (×2): qty 0.25

## 2016-05-19 MED ORDER — INSULIN ASPART 100 UNIT/ML ~~LOC~~ SOLN
3.0000 [IU] | Freq: Three times a day (TID) | SUBCUTANEOUS | Status: DC
  Administered 2016-05-19 (×2): 3 [IU] via SUBCUTANEOUS
  Filled 2016-05-19 (×2): qty 3

## 2016-05-19 MED ORDER — POTASSIUM CHLORIDE CRYS ER 20 MEQ PO TBCR
40.0000 meq | EXTENDED_RELEASE_TABLET | ORAL | Status: AC
  Administered 2016-05-19 (×2): 40 meq via ORAL
  Filled 2016-05-19 (×2): qty 2

## 2016-05-19 MED ORDER — CHOLESTYRAMINE LIGHT 4 G PO PACK
4.0000 g | PACK | Freq: Every day | ORAL | Status: DC
  Administered 2016-05-19 – 2016-05-21 (×2): 4 g via ORAL
  Filled 2016-05-19 (×2): qty 1

## 2016-05-19 MED ORDER — SODIUM CHLORIDE 0.9 % IV SOLN
INTRAVENOUS | Status: AC
  Administered 2016-05-19: 17:00:00 via INTRAVENOUS

## 2016-05-19 MED ORDER — CEFAZOLIN SODIUM-DEXTROSE 2-4 GM/100ML-% IV SOLN
2.0000 g | INTRAVENOUS | Status: AC
  Administered 2016-05-20: 2 g via INTRAVENOUS
  Filled 2016-05-19: qty 100

## 2016-05-19 NOTE — Progress Notes (Signed)
Report called to [facility] on [place], pt will transport via wheelchair, no O2 needed, belongings and chart will transport with him

## 2016-05-19 NOTE — Progress Notes (Signed)
MRI not performed today.  D/w MRI dept and on schedule for am.

## 2016-05-19 NOTE — Progress Notes (Signed)
Consult Note  Patient name: Chase Clark MRN: [id number] DOB: 1965/12/15 Sex: male  Consulting Physician:  Dr. Vickki Muff  Reason for Consult:  Chief Complaint  Patient presents with  . Diarrhea    HISTORY OF PRESENT ILLNESS: 51 year old male who presented with DKA and a diabetic left heel ulcer.  This was debrided at the bedside by Dr. Vickki Muff.    Currently he states at rest he is not having any foot pain but he finds it a 6 out of 10 he walks on his left heel.  The patient suffers from diabetes with recent elevation in blood sugars.  He has CAD and has previously undergone PCI.  He is on an ACE for HTN.  He has hypercholesterolemia but has a statin allergy.  He takes an ASA.Marland Kitchen  He is a former smoker.  Past Medical History:  Diagnosis Date  . Arthritis   . Asthma    as child  . Coronary artery disease   . Diabetes mellitus without complication ([place])   . Edema, lower extremity   . Fatty liver   . GERD (gastroesophageal reflux disease)   . Glaucoma   . Heart disease 2011   Patient has stent for 80% blockage  . Hyperlipidemia   . Hypertension   . Neuropathy ([place])   . Seasonal allergies   . Shoulder pain   . Ulcer of foot ([place]) 08.06.2014   DIABETIC ULCERATIONS ASSOCIATED WITH IRRITATION LATERAL ANKLE LEFT GREATER THAN RIGHT WITH MILD CELLULITIS    Past Surgical History:  Procedure Laterality Date  . CARDIAC CATHETERIZATION    . CATARACT EXTRACTION W/ INTRAOCULAR LENS IMPLANT    . CHOLECYSTECTOMY N/A   . CORONARY ANGIOPLASTY WITH STENT PLACEMENT  2007   1st diagonal  . EPIBLEPHERON REPAIR WITH TEAR DUCT PROBING    . EYE SURGERY Right sept 29n2015   cataract extraction  . INSERTION EXPRESS TUBE SHUNT Right 09/06/2015   Procedure: INSERTION AHMED TUBE SHUNT with tutoplast allograft;  Surgeon: Eulogio Bear, MD;  Location: [facility] ORS;  Service: Ophthalmology;  Laterality: Right;  Marland Kitchen VASECTOMY      Social History   Social History  . Marital status: Married    Spouse name: N/A  . Number of children: N/A  . Years of education: N/A   Occupational History  . Not on file.   Social History Main Topics  . Smoking status: Former Research scientist (life sciences)  . Smokeless tobacco: Never Used  . Alcohol use Yes  . Drug use: No  . Sexual activity: Not on file   Other Topics Concern  . Not on file   Social History Narrative  . No narrative on file    Family History  Problem Relation Age of Onset  . Hyperlipidemia Mother   . Diabetes Mother   . Hyperlipidemia Father   . Diabetes Father   . Heart disease Father   . Hypertension Father     Allergies as of 05/17/2016 - Review Complete 05/17/2016  Allergen Reaction Noted  . Atorvastatin  01/14/2014  . Codeine sulfate Nausea Only 01/12/2014    No current facility-administered medications on file prior to encounter.    Current Outpatient Prescriptions on File Prior to Encounter  Medication Sig Dispense Refill  . Difluprednate (DUREZOL) 0.05 % EMUL Apply 1 drop to eye at bedtime. Left eye    . esomeprazole (NEXIUM) 40 MG capsule Take 1 capsule (40 mg total) by mouth daily. (Patient taking differently:  Take 20 mg by mouth daily. ) 90 capsule 0  . gabapentin (NEURONTIN) 300 MG capsule Take 1 capsule (300 mg total) by mouth 2 (two) times daily. (Patient taking differently: Take 600 mg by mouth 2 (two) times daily. ) 180 capsule 3  . insulin regular (NOVOLIN R,HUMULIN R) 100 units/mL injection Inject 50 Units into the skin 2 (two) times daily. Reported on 10/03/2015    . lisinopril (PRINIVIL,ZESTRIL) 40 MG tablet Take 40 mg by mouth daily.   0  . losartan-hydrochlorothiazide (HYZAAR) 100-25 MG tablet Take 1 tablet by mouth daily.  11  . metFORMIN (GLUCOPHAGE) 1000 MG tablet TAKE 1 TABLET BY MOUTH TWICE A DAY 180 tablet 0  . NON FORMULARY Artifical tears    . aspirin EC 325 MG tablet Take 1 tablet (325 mg total) by mouth daily. (Patient taking differently: Take 81 mg by mouth daily. ) 30 tablet 0  . Canagliflozin  (INVOKANA) 100 MG TABS Take 1 tablet (100 mg total) by mouth daily with breakfast. (Patient not taking: Reported on 10/03/2015) 30 tablet 6  . glucosamine-chondroitin 500-400 MG tablet Take 1 tablet by mouth daily.    Marland Kitchen glucose blood (BAYER CONTOUR NEXT TEST) test strip Check blood sugar tid (Patient not taking: Reported on 10/03/2015) 100 each 5  . glucose blood test strip 1 each by Other route 3 (three) times daily. Use as instructed (Patient not taking: Reported on 10/03/2015) 100 each 12  . insulin glargine (LANTUS) 100 UNIT/ML injection Inject 90 Units into the skin 2 (two) times daily.    . insulin lispro (HUMALOG) 100 UNIT/ML injection Inject 60 Units into the skin 2 (two) times daily.    . Multiple Vitamin (MULTIVITAMIN) tablet Take 1 tablet by mouth daily.       REVIEW OF SYSTEMS: See HPI, no changes  PHYSICAL EXAMINATION: General: The patient appears their stated age.  Vital signs are BP 139/82 (BP Location: Left Arm)   Pulse 89   Temp 99.3 F (37.4 C) (Oral)   Resp 19   Ht 5\' 10"  (1.778 m)   Wt 80.5 kg (177 lb 7.5 oz)   SpO2 96%   BMI 25.46 kg/m  Pulmonary: Respirations are non-labored HEENT:  No gross abnormalities Abdomen: Soft and non-tender  Musculoskeletal: There are no major deformities.   Neurologic: No focal weakness or paresthesias are detected, Skin: left heel ulcer with drainage Psychiatric: The patient has normal affect. Cardiovascular: There is a regular rate and rhythm.  Palpable left DP2+.  I can not palpate a PT    Assessment:  Left diabetic heel ulcer   Plan: I have discussed with the patient the implications of ulceration in a diabetic patient. Given the fact that his posterior tibial appears to be diseased and he has a heel ulcer I agree with the previously discussed plan that angiography should be undertaken. I will plan for Tuesday. Patient has a history of coronary artery disease and therefore is at high risk for peripheral artery disease. Should  we be able to recruit the posterior tibial back into circulation this would expedite wound healing and improve his overall prognosis. The risks and benefits for angiography of been refused he is quite familiar with this all questions of been answered and he agrees to proceed.   Katha Cabal, M.D. [facility] Phone: [phone]

## 2016-05-19 NOTE — Progress Notes (Signed)
Per Dr [name] ok for patient to move to any MS w/ off unit tele, reduce maintenance infusion to 50 ml/hr

## 2016-05-19 NOTE — Consult Note (Signed)
Pharmacy Antibiotic Note  Chase Clark is a 51 y.o. male admitted on 05/17/2016 with diabetic foot ulcer.  Pharmacy has been consulted for vancomycin and zosyn dosing.  Currently ordered Vancomycin 1g IV q8h and Zosyn 3.375g IV q8h EI.  2/5 1330 Vancomycin trough 16 mcg/ml  Plan: Continue with current dosing. Follow SCr closely.   Height: 5\' 10"  (177.8 cm) Weight: 177 lb 7.5 oz (80.5 kg) IBW/kg (Calculated) : 73  Temp (24hrs), Avg:99.9 F (37.7 C), Min:99.2 F (37.3 C), Max:102.1 F (38.9 C)   Recent Labs Lab 05/17/16 1342 05/17/16 2051 05/18/16 0003 05/18/16 0506 05/18/16 1112 05/19/16 0013 05/19/16 1322  WBC 23.2*  --   --  15.8*  --  16.6*  --   CREATININE 1.34* 1.15 1.00 0.85 0.77 0.69  --   VANCOTROUGH  --   --   --   --   --   --  16    Estimated Creatinine Clearance: 111.5 mL/min (by C-G formula based on SCr of 0.69 mg/dL).    Allergies  Allergen Reactions  . Atorvastatin     Myalgias   . Codeine Sulfate Nausea Only    Antimicrobials this admission: vancomycin 2/3 >>  Zosyn 2/4 >>  Thank you for allowing pharmacy to be a part of this patient's care.  Paulina Fusi, PharmD, BCPS 05/19/2016 3:25 PM

## 2016-05-19 NOTE — Progress Notes (Addendum)
Inpatient Diabetes Program Recommendations  AACE/ADA: New Consensus Statement on Inpatient Glycemic Control (2015)  Target Ranges:  Prepandial:   less than 140 mg/dL      Peak postprandial:   less than 180 mg/dL (1-2 hours)      Critically ill patients:  140 - 180 mg/dL   Results for DONIS, PINDER (MRN [id number]) as of 05/19/2016 08:22  Ref. Range 05/18/2016 06:56 05/18/2016 08:10 05/18/2016 09:29 05/18/2016 10:35 05/18/2016 11:58 05/18/2016 16:34 05/18/2016 22:24 05/19/2016 07:10  Glucose-Capillary Latest Ref Range: 65 - 99 mg/dL 137 (H) 164 (H) 172 (H) 194 (H) 195 (H) 250 (H) 196 (H) 240 (H)   Review of Glycemic Control  Diabetes history: DM2 Outpatient Diabetes medications: NPH 100 units BID, Regular 50 units BID with meals, Metformin 1000 mg BID Current orders for Inpatient glycemic control: Novolog 0-9 units TID with meals, Novolog 0-5 units QHS, Metformin 1000 mg BID  Inpatient Diabetes Program Recommendations: Insulin - Basal: Noted patient received Levemir 20 units on 05/18/16 at 9:31 am and no other orders for Levemir. Please consider ordering Levemir 25 units daily (starting today). Insulin - Meal Coverage: Please consider ordering Novolog 3 units TID with meals for meal coverage if patient eats at least 50% of meals. A1C: A1C in process.  Addendum 05/19/16@14 :00-Spoke with patient about diabetes and home regimen for diabetes control. Patient states that he was dx with DM at age 51 or 51 years old and was initialy started on oral DM medications. Patient states he was very over weight and has lost over 100 lbs since then and has been able to keep weight off over all these years. Patient reports that he has not seen a doctor in over 1 year and he has been managing his diabetes himself. Patient states he did have an Endocrinologist in the past but was fired from her practice for "noncompliance".   Patient reports that he is getting insulin from [facility] over the counter and he is taking NPH 100 units BID  and Regular 50 units BID as an outpatient for diabetes control. Patient reports that he is very frustrated with his DM control because even when he was seeing an Endocrinologist and doing what he was asked to do by MD, his glucose was still uncontrolled.  Patient states that he is not checking his glucose at all "what is the point, I know my sugar is going to be high."  Discussed glucose and A1C goals. Discussed importance of checking CBGs and maintaining good CBG control to prevent long-term and short-term complications. Explained that if he is not monitoring glucose at home it is hard to really know how his glucose is trending and what changes that need to be made to improve DM control. Explained how hyperglycemia leads to damage within blood vessels which lead to the common complications seen with uncontrolled diabetes. Stressed to the patient the importance of improving glycemic control to prevent further complications from uncontrolled diabetes especially with foot ulcer. Stressed importance of DM control to promote wound healing and decrease risk of developing further complications with foot.  Discussed impact of nutrition, exercise, stress, sickness, and medications on diabetes control. Patient states that he follows a Mediterranean diet but admits that he does like to eat cookies and ice cream. Discussed carbohydrates, carbohydrate goals per day and meal, along with portion sizes. Explained that if patient is going to eat sweets with or directly after meal he needs to account for the carbohydrates and eat them in moderation. Discussed current  insulin regimen and explained that he is receiving a lot less insulin than he reports he takes at home. Patient reports that he injects insulin in abdomen (rotating sites). Examined abdomen for scar tissue from injections and none noted.  Encouraged patient to check his glucose 4 times per day (before meals and at bedtime) and to keep a log book of glucose readings and  insulin taken which he will need to take to doctor appointments. Patient reports that his wife is a Marine scientist and that she is going to make him an appointment with an Musician at [facility] Endocrinology. Patient's wife came in during conversation and confirms that she is planning to make him an appointment and she is going to help him with getting DM controlled. Stressed again how imperative it is that he work to improve DM control to promote wound healing and decrease risk of complications.  Patient verbalized understanding of information discussed and he states that he has no further questions at this time related to diabetes.  Thanks, Barnie Alderman, RN, MSN, CDE Diabetes Coordinator Inpatient Diabetes Program [phone] (Team Pager from 8am to 5pm)

## 2016-05-19 NOTE — Progress Notes (Signed)
[place] at [place] NAME: Chase Clark    MR#:  [id number]  DATE OF BIRTH:  September 14, 1965  SUBJECTIVE: Patient admitted for DKA, started on insulin drip, and anion gap  is closed,.   Today is the feeling better. Seen by vascular, possible angiogram tomorrow.   CHIEF COMPLAINT:   Chief Complaint  Patient presents with  . Diarrhea    REVIEW OF SYSTEMS:   ROS CONSTITUTIONAL: No fever, fatigue or weakness.  EYES: No blurred or double vision.  EARS, NOSE, AND THROAT: No tinnitus or ear pain.  RESPIRATORY: No cough, shortness of breath, wheezing or hemoptysis.  CARDIOVASCULAR: No chest pain, orthopnea, edema.  GASTROINTESTINAL: No nausea, vomiting, diarrhea or abdominal pain.  GENITOURINARY: No dysuria, hematuria.  ENDOCRINE: No polyuria, nocturia,  HEMATOLOGY: No anemia, easy bruising or bleeding SKIN: No rash or lesion. MUSCULOSKELETAL:ulcer on left foot,debrided by podiatry yesterday. NEUROLOGIC: No tingling, numbness, weakness.  PSYCHIATRY: No anxiety or depression.   DRUG ALLERGIES:   Allergies  Allergen Reactions  . Atorvastatin     Myalgias   . Codeine Sulfate Nausea Only    VITALS:  Blood pressure (!) 123/93, pulse (!) 104, temperature 99.5 F (37.5 C), temperature source Axillary, resp. rate 19, height 5\' 10"  (1.778 m), weight 80.5 kg (177 lb 7.5 oz), SpO2 97 %.  PHYSICAL EXAMINATION:  GENERAL:  51 y.o.-year-old patient lying in the bed with no acute distress.  EYES: Pupils equal, round, reactive to light and accommodation. No scleral icterus. Extraocular muscles intact.  HEENT: Head atraumatic, normocephalic. Oropharynx and nasopharynx clear.  NECK:  Supple, no jugular venous distention. No thyroid enlargement, no tenderness.  LUNGS: Normal breath sounds bilaterally, no wheezing, rales,rhonchi or crepitation. No use of accessory muscles of respiration.  CARDIOVASCULAR: S1, S2 normal. No murmurs, rubs, or  gallops.  ABDOMEN: Soft, nontender, nondistended. Bowel sounds present. No organomegaly or mass.  EXTREMITIES: Left heel with posterior necrotic eschar with surrounding dark dusky skin and erythema.right leg clear. NEUROLOGIC: Cranial nerves II through XII are intact. Muscle strength 5/5 in all extremities. Sensation intact. Gait not checked.  PSYCHIATRIC: The patient is alert and oriented x 3.  SKIN: No obvious rash, lesion, or ulcer.    LABORATORY PANEL:   CBC  Recent Labs Lab 05/19/16 0013  WBC 16.6*  HGB 10.2*  HCT 29.6*  PLT 324   ------------------------------------------------------------------------------------------------------------------  Chemistries   Recent Labs Lab 05/18/16 0506  05/19/16 0013  NA 131*  < > 130*  K 3.3*  < > 3.3*  CL 101  < > 99*  CO2 22  < > 23  GLUCOSE 153*  < > 263*  BUN 19  < > 12  CREATININE 0.85  < > 0.69  CALCIUM 8.7*  < > 8.8*  AST 12*  --   --   ALT 9*  --   --   ALKPHOS 88  --   --   BILITOT 0.9  --   --   < > = values in this interval not displayed. ------------------------------------------------------------------------------------------------------------------  Cardiac Enzymes No results for input(s): TROPONINI in the last 168 hours. ------------------------------------------------------------------------------------------------------------------  RADIOLOGY:  Dg Foot Complete Left  Result Date: 05/17/2016 CLINICAL DATA:  Soft tissue ulcer over the calcaneus EXAM: LEFT FOOT - COMPLETE 3+ VIEW COMPARISON:  11/17/2012 FINDINGS: No acute fracture or dislocation is noted. The soft tissue ulcer is noted. No underlying bony erosive changes are identified to suggest osteomyelitis. Vascular calcifications are seen. IMPRESSION:  Soft tissue wound without evidence of osteomyelitis. Electronically Signed   By: Inez Catalina M.D.   On: 05/17/2016 17:32    EKG:   Orders placed or performed during the hospital encounter of 09/04/15  .  ED EKG  . ED EKG  . EKG 12-Lead  . EKG 12-Lead  . EKG    ASSESSMENT AND PLAN:   1 DKA: Resolved:  Off insulin drip. Started on low-dose Lantus, insulin. Non compliance  With meds. Did on NovoLog 3 units 3 times a day with meals,lantus 25 sq daily.  2 left heel ulcer secondary to diabetes mellitus type 2: Seen by podiatry, has sepsis on admission secondary to this necrotic diabetic foot ulcer: seen by vasculat,going for angiogram of left leg am, #3. Neuropathy; continue Neurontin  #4 hyponatremia is improving: GI , DVT prophylaxis D/w wife Transfer to medical floor with off unti tele All the records are reviewed and case discussed with Care Management/Social Workerr. Management plans discussed with the patient, family and they are in agreement.  CODE STATUS: full  TOTAL TIME TAKING CARE OF THIS PATIENT: 84minutes.            POSSIBLE D/C IN 1-2 DAYS, DEPENDING ON CLINICAL CONDITION.   Epifanio Lesches M.D on 05/19/2016 at 1:27 PM  Between 7am to 6pm - Pager - [phone]  After 6pm go to [email or web address] - password [email or web address] [facility]  Office  [phone]  CC: Primary care physician; Kirk Ruths., MD   Note: This dictation was prepared with Dragon dictation along with smaller phrase technology. Any transcriptional errors that result from this process are unintentional.

## 2016-05-20 ENCOUNTER — Inpatient Hospital Stay: Payer: 59

## 2016-05-20 ENCOUNTER — Encounter
Admission: EM | Disposition: A | Discharge status: Home Health | Payer: Self-pay | Source: Home / Self Care | Attending: Internal Medicine

## 2016-05-20 ENCOUNTER — Telehealth: Payer: Self-pay | Admitting: Internal Medicine

## 2016-05-20 DIAGNOSIS — I70248 Atherosclerosis of native arteries of left leg with ulceration of other part of lower left leg: Secondary | ICD-10-CM

## 2016-05-20 HISTORY — PX: ABDOMINAL AORTOGRAM: CATH118222

## 2016-05-20 LAB — CBC
HCT: 30 % — ABNORMAL LOW (ref 40.0–52.0)
Hemoglobin: 10.3 g/dL — ABNORMAL LOW (ref 13.0–18.0)
MCH: 30.2 pg (ref 26.0–34.0)
MCHC: 34.4 g/dL (ref 32.0–36.0)
MCV: 87.8 fL (ref 80.0–100.0)
Platelets: 310 10*3/uL (ref 150–440)
RBC: 3.42 MIL/uL — ABNORMAL LOW (ref 4.40–5.90)
RDW: 12.8 % (ref 11.5–14.5)
WBC: 14.8 10*3/uL — ABNORMAL HIGH (ref 3.8–10.6)

## 2016-05-20 LAB — BASIC METABOLIC PANEL
Anion gap: 9 (ref 5–15)
BUN: 7 mg/dL (ref 6–20)
CO2: 25 mmol/L (ref 22–32)
Calcium: 8.6 mg/dL — ABNORMAL LOW (ref 8.9–10.3)
Chloride: 98 mmol/L — ABNORMAL LOW (ref 101–111)
Creatinine, Ser: 0.72 mg/dL (ref 0.61–1.24)
GFR calc Af Amer: >60 mL/min (ref >60)
GFR calc non Af Amer: >60 mL/min (ref >60)
Glucose, Bld: 210 mg/dL — ABNORMAL HIGH (ref 65–99)
Potassium: 3.7 mmol/L (ref 3.5–5.1)
Sodium: 132 mmol/L — ABNORMAL LOW (ref 135–145)

## 2016-05-20 LAB — GLUCOSE, CAPILLARY
Glucose-Capillary: 236 mg/dL — ABNORMAL HIGH (ref 65–99)
Glucose-Capillary: 197 mg/dL — ABNORMAL HIGH (ref 65–99)
Glucose-Capillary: 200 mg/dL — ABNORMAL HIGH (ref 65–99)
Glucose-Capillary: 142 mg/dL — ABNORMAL HIGH (ref 65–99)

## 2016-05-20 LAB — MAGNESIUM: Magnesium: 1.3 mg/dL — ABNORMAL LOW (ref 1.7–2.4)

## 2016-05-20 LAB — HEMOGLOBIN A1C
Hgb A1c MFr Bld: 15.3 % — ABNORMAL HIGH (ref 4.8–5.6)
Mean Plasma Glucose: 392 mg/dL

## 2016-05-20 SURGERY — ABDOMINAL AORTOGRAM
Anesthesia: Moderate Sedation

## 2016-05-20 MED ORDER — COLLAGENASE 250 UNIT/GM EX OINT
TOPICAL_OINTMENT | Freq: Every day | CUTANEOUS | Status: DC
  Administered 2016-05-21: 09:00:00 via TOPICAL
  Filled 2016-05-20: qty 30

## 2016-05-20 MED ORDER — INSULIN ASPART 100 UNIT/ML ~~LOC~~ SOLN
5.0000 [IU] | Freq: Three times a day (TID) | SUBCUTANEOUS | Status: DC
  Administered 2016-05-21 (×2): 5 [IU] via SUBCUTANEOUS
  Filled 2016-05-20 (×2): qty 5

## 2016-05-20 MED ORDER — FENTANYL CITRATE (PF) 100 MCG/2ML IJ SOLN
INTRAMUSCULAR | Status: DC | PRN
  Administered 2016-05-20 (×3): 50 ug via INTRAVENOUS

## 2016-05-20 MED ORDER — FENTANYL CITRATE (PF) 100 MCG/2ML IJ SOLN
INTRAMUSCULAR | Status: AC
  Filled 2016-05-20: qty 2

## 2016-05-20 MED ORDER — LIVING WELL WITH DIABETES BOOK
Freq: Once | Status: DC
  Filled 2016-05-20: qty 1

## 2016-05-20 MED ORDER — SODIUM CHLORIDE 0.9 % IV SOLN: 500.0000 mL | Freq: Once | INTRAVENOUS | Status: DC | PRN

## 2016-05-20 MED ORDER — INSULIN GLARGINE 100 UNIT/ML ~~LOC~~ SOLN
30.0000 [IU] | Freq: Every day | SUBCUTANEOUS | Status: DC
  Administered 2016-05-21: 30 [IU] via SUBCUTANEOUS
  Filled 2016-05-20: qty 0.3

## 2016-05-20 MED ORDER — MIDAZOLAM HCL 5 MG/5ML IJ SOLN
INTRAMUSCULAR | Status: AC
  Filled 2016-05-20: qty 5

## 2016-05-20 MED ORDER — IOPAMIDOL (ISOVUE-300) INJECTION 61%
INTRAVENOUS | Status: DC | PRN
  Administered 2016-05-20: 55 mL via INTRA_ARTERIAL

## 2016-05-20 MED ORDER — HEPARIN SODIUM (PORCINE) 1000 UNIT/ML IJ SOLN
INTRAMUSCULAR | Status: AC
Start: 2016-05-20 — End: ?
  Filled 2016-05-20: qty 1

## 2016-05-20 MED ORDER — ALUM & MAG HYDROXIDE-SIMETH 200-200-20 MG/5ML PO SUSP: 15.0000 mL | ORAL | Status: DC | PRN

## 2016-05-20 MED ORDER — HEPARIN SODIUM (PORCINE) 1000 UNIT/ML IJ SOLN
INTRAMUSCULAR | Status: DC | PRN
  Administered 2016-05-20: 5000 [IU] via INTRAVENOUS

## 2016-05-20 MED ORDER — LIDOCAINE HCL (PF) 1 % IJ SOLN
INTRAMUSCULAR | Status: AC
  Filled 2016-05-20: qty 10

## 2016-05-20 MED ORDER — MIDAZOLAM HCL 2 MG/2ML IJ SOLN
INTRAMUSCULAR | Status: DC | PRN
  Administered 2016-05-20 (×2): 1 mg via INTRAVENOUS
  Administered 2016-05-20: 2 mg via INTRAVENOUS

## 2016-05-20 SURGICAL SUPPLY — 21 items
CATH CROSSER S6 154CM (CATHETERS) ×1 IMPLANT
CATH CXI SUPP ST 2.6FR 150CM (CATHETERS) ×1 IMPLANT
CATH GWIRE MARINER STRGHT 4FR (CATHETERS) ×1 IMPLANT
CATH PIG 70CM (CATHETERS) ×1 IMPLANT
CATH USHER TPER 130CM (CATHETERS) ×1 IMPLANT
DEVICE STARCLOSE SE CLOSURE (Vascular Products) ×1 IMPLANT
DEVICE TORQUE (MISCELLANEOUS) ×1 IMPLANT
GLIDEWIRE ANGLED SS 035X260CM (WIRE) ×1 IMPLANT
GUIDEWIRE PFTE-COATED .018X300 (WIRE) ×1 IMPLANT
KIT FLOWMATE PROCEDURAL (MISCELLANEOUS) ×1 IMPLANT
NDL ENTRY 21GA 7CM ECHOTIP (NEEDLE) IMPLANT
NEEDLE ENTRY 21GA 7CM ECHOTIP (NEEDLE) ×2 IMPLANT
PACK ANGIOGRAPHY (CUSTOM PROCEDURE TRAY) ×1 IMPLANT
SET INTRO CAPELLA COAXIAL (SET/KITS/TRAYS/PACK) ×1 IMPLANT
SHEATH BRITE TIP 5FRX11 (SHEATH) ×1 IMPLANT
SHEATH HIGHFLEX ANSEL 6FRX55 (SHEATH) ×1 IMPLANT
SYR MEDRAD MARK V 150ML (SYRINGE) ×1 IMPLANT
TOWEL OR 17X26 4PK STRL BLUE (TOWEL DISPOSABLE) ×1 IMPLANT
TUBING CONTRAST HIGH PRESS 72 (TUBING) ×1 IMPLANT
WIRE G V18X300CM (WIRE) ×1 IMPLANT
WIRE J 3MM .035X145CM (WIRE) ×1 IMPLANT

## 2016-05-20 NOTE — Progress Notes (Signed)
Inpatient Diabetes Program Recommendations  AACE/ADA: New Consensus Statement on Inpatient Glycemic Control (2015)  Target Ranges:  Prepandial:   less than 140 mg/dL      Peak postprandial:   less than 180 mg/dL (1-2 hours)      Critically ill patients:  140 - 180 mg/dL   Lab Results  Component Value Date   GLUCAP 236 (H) 05/20/2016   HGBA1C 15.3 (H) 05/18/2016    Review of Glycemic Control  Results for Chase Clark, Chase Clark (MRN [id number]) as of 05/20/2016 07:54  Ref. Range 05/19/2016 07:10 05/19/2016 11:19 05/19/2016 16:16 05/19/2016 21:08 05/20/2016 07:29  Glucose-Capillary Latest Ref Range: 65 - 99 mg/dL 240 (H) 230 (H) 228 (H) 212 (H) 236 (H)    Diabetes history: DM2 Outpatient Diabetes medications: NPH 100 units BID, Regular 50 units BID with meals, Metformin 1000 mg BID Current orders for Inpatient glycemic control: Novolog 0-9 units TID with meals, Novolog 0-5 units QHS, Metformin 1000 mg BID, Levemir 25 units qday, Novolog 3 units tid with meals   Inpatient Diabetes Program Recommendations: Please consider ordering Levemir 30 units daily- fasting blood sugar 236mg /dl.  Please consider ordering Novolog 5 units TID with meals for meal coverage if patient eats at least 50% of meals- post prandial blood sugars remain high.  Gentry Fitz, RN, BA, MHA, CDE Diabetes Coordinator Inpatient Diabetes Program  [phone] (Team Pager) [phone] ([place]) 05/20/2016 7:59 AM

## 2016-05-20 NOTE — Progress Notes (Signed)
F/U left heel ulcercation. MRI today.  Basically negative for osteo. No abcess.  Recommend local wound care for now. Order for Santyl dressing changes to be performed daily and to continue upon discharge.  -Cleanse wound daily with saline and apply santyl ointment.  Cover with bulky dressing.  Should f/u with me in 1 month. Recommend f/u with wound care center in 1-2 weeks. Will sign off for now.  Re-consult if needed.

## 2016-05-20 NOTE — Progress Notes (Signed)
Inpatient Diabetes Program Recommendations  AACE/ADA: New Consensus Statement on Inpatient Glycemic Control (2015)  Target Ranges:  Prepandial:   less than 140 mg/dL      Peak postprandial:   less than 180 mg/dL (1-2 hours)      Critically ill patients:  140 - 180 mg/dL   Lab Results  Component Value Date   GLUCAP 200 (H) 05/20/2016   HGBA1C 15.3 (H) 05/18/2016    Review of Glycemic Control  Spoke to patient and his wife about A1C result of 15.3%.  They have contacted Dr. Lupita Dawn office and are going to be able to see endocrinology Dr. [name] at that practice. We discussed the importance of getting these blood sugars numbers down. 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   As he told staff yesterday- he has tried really hard in the past to control blood sugars and got frustrated when his blood sugars were still out of control. Encouraged patient to keep good records of what medications he has tried and what the side effects were.  Open to outpatient diabetes education- referral sent.    Discussed my recommendations for insulin increases- patient and wife receptive to information. Text page to Dr. [name] regarding recommendations.   Patient did not receive his full dose of basal insulin this morning- blood sugars will likely be very elevated as a result of this.   Gentry Fitz, RN, BA, MHA, CDE Diabetes Coordinator Inpatient Diabetes Program  [phone] (Team Pager) [phone] ([place]) 05/20/2016 12:20 PM

## 2016-05-20 NOTE — Consult Note (Signed)
Pharmacy Antibiotic Note  Chase Clark is a 51 y.o. male admitted on 05/17/2016 with diabetic foot ulcer.  Pharmacy has been consulted for vancomycin and zosyn dosing.  Currently ordered Vancomycin 1g IV q8h and Zosyn 3.375g IV q8h EI.  2/5 1330 Vancomycin trough 16 mcg/ml  Plan: Continue with current dosing. Follow SCr closely. Will check a repeat Vancomycin trough and SCr with 05/21/16 AM labs.   Height: 5\' 10"  (177.8 cm) Weight: 188 lb 6.4 oz (85.5 kg) IBW/kg (Calculated) : 73  Temp (24hrs), Avg:100.2 F (37.9 C), Min:98.6 F (37 C), Max:102 F (38.9 C)   Recent Labs Lab 05/17/16 1342  05/18/16 0003 05/18/16 0506 05/18/16 1112 05/19/16 0013 05/19/16 1322 05/20/16 0454 05/20/16 0455  WBC 23.2*  --   --  15.8*  --  16.6*  --   --  14.8*  CREATININE 1.34*  < > 1.00 0.85 0.77 0.69  --  0.72  --   VANCOTROUGH  --   --   --   --   --   --  16  --   --   < > = values in this interval not displayed.  Estimated Creatinine Clearance: 111.5 mL/min (by C-G formula based on SCr of 0.72 mg/dL).    Allergies  Allergen Reactions  . Atorvastatin     Myalgias   . Codeine Sulfate Nausea Only    Antimicrobials this admission: vancomycin 2/3 >>  Zosyn 2/4 >>  Thank you for allowing pharmacy to be a part of this patient's care.  Paulina Fusi, PharmD, BCPS 05/20/2016 12:23 PM

## 2016-05-20 NOTE — Progress Notes (Signed)
Handoff report given to oncoming shift RN, pt currently off unit in procedure.

## 2016-05-20 NOTE — Progress Notes (Signed)
[place] at [place] NAME: Chase Clark    MR#:  [id number]  DATE OF BIRTH:  07-Jun-1965  SUBJECTIVE: Patient admitted for DKA, started on insulin drip, and anion gap  is closed,.   he  is nothing by mouth, going for angiogram today. He says he feels better than yesterday., No fever.   CHIEF COMPLAINT:   Chief Complaint  Patient presents with  . Diarrhea    REVIEW OF SYSTEMS:   ROS CONSTITUTIONAL: No fever, fatigue or weakness.  EYES: No blurred or double vision.  EARS, NOSE, AND THROAT: No tinnitus or ear pain.  RESPIRATORY: No cough, shortness of breath, wheezing or hemoptysis.  CARDIOVASCULAR: No chest pain, orthopnea, edema.  GASTROINTESTINAL: No nausea, vomiting, diarrhea or abdominal pain.  GENITOURINARY: No dysuria, hematuria.  ENDOCRINE: No polyuria, nocturia,  HEMATOLOGY: No anemia, easy bruising or bleeding SKIN: No rash or lesion. MUSCULOSKELETAL:ulcer on left foot,debrided by podiatry . NEUROLOGIC: No tingling, numbness, weakness.  PSYCHIATRY: No anxiety or depression.   DRUG ALLERGIES:   Allergies  Allergen Reactions  . Atorvastatin     Myalgias   . Codeine Sulfate Nausea Only    VITALS:  Blood pressure 111/66, pulse 80, temperature 98.9 F (37.2 C), temperature source Oral, resp. rate 18, height 5\' 10"  (1.778 m), weight 85.5 kg (188 lb 6.4 oz), SpO2 96 %.  PHYSICAL EXAMINATION:  GENERAL:  51 y.o.-year-old patient lying in the bed with no acute distress.  EYES: Pupils equal, round, reactive to light and accommodation. No scleral icterus. Extraocular muscles intact.  HEENT: Head atraumatic, normocephalic. Oropharynx and nasopharynx clear.  NECK:  Supple, no jugular venous distention. No thyroid enlargement, no tenderness.  LUNGS: Normal breath sounds bilaterally, no wheezing, rales,rhonchi or crepitation. No use of accessory muscles of respiration.  CARDIOVASCULAR: S1, S2 normal. No murmurs, rubs, or  gallops.  ABDOMEN: Soft, nontender, nondistended. Bowel sounds present. No organomegaly or mass.  EXTREMITIES: Left heel with posterior necrotic eschar with surrounding dark dusky skin and erythema.right leg clear. NEUROLOGIC: Cranial nerves II through XII are intact. Muscle strength 5/5 in all extremities. Sensation intact. Gait not checked.  PSYCHIATRIC: The patient is alert and oriented x 3.  SKIN: No obvious rash, lesion, or ulcer.    LABORATORY PANEL:   CBC  Recent Labs Lab 05/20/16 0455  WBC 14.8*  HGB 10.3*  HCT 30.0*  PLT 310   ------------------------------------------------------------------------------------------------------------------  Chemistries   Recent Labs Lab 05/18/16 0506  05/20/16 0454  NA 131*  < > 132*  K 3.3*  < > 3.7  CL 101  < > 98*  CO2 22  < > 25  GLUCOSE 153*  < > 210*  BUN 19  < > 7  CREATININE 0.85  < > 0.72  CALCIUM 8.7*  < > 8.6*  MG  --   --  1.3*  AST 12*  --   --   ALT 9*  --   --   ALKPHOS 88  --   --   BILITOT 0.9  --   --   < > = values in this interval not displayed. ------------------------------------------------------------------------------------------------------------------  Cardiac Enzymes No results for input(s): TROPONINI in the last 168 hours. ------------------------------------------------------------------------------------------------------------------  RADIOLOGY:  Mr Ankle Left Wo Contrast  Result Date: 05/20/2016 CLINICAL DATA:  Diabetic left heel ulcer. EXAM: MRI OF THE LEFT ANKLE WITHOUT CONTRAST TECHNIQUE: Multiplanar, multisequence MR imaging of the ankle was performed. No intravenous contrast was administered. COMPARISON:  Radiographs dated 05/17/2016 FINDINGS: TENDONS Peroneal: Small amount of fluid in the tendon sheaths. Posteromedial: Small amount of fluid in the tendon sheaths. Anterior: Intact tibialis anterior, extensor hallucis longus and extensor digitorum longus tendons. Achilles: Intact. Plantar  Fascia: No acute abnormality. LIGAMENTS Lateral: Intact. Medial: Intact. CARTILAGE Ankle Joint: No joint effusion or chondral defect. Subtalar Joints/Sinus Tarsi: No joint effusion or chondral defect. Bones: There is no osteomyelitis or other significant bone abnormality. Superficial soft tissue ulceration overlying the posterior aspect of the calcaneus. Other: None IMPRESSION: 1. No evidence of osteomyelitis. Superficial soft tissue ulceration on the heel. 2. Minimal tenosynovitis involving the peroneal and posteromedial tendons of the ankle. Electronically Signed   By: Lorriane Shire M.D.   On: 05/20/2016 11:25    EKG:   Orders placed or performed during the hospital encounter of 09/04/15  . ED EKG  . ED EKG  . EKG 12-Lead  . EKG 12-Lead  . EKG    ASSESSMENT AND PLAN:   1 DKA: Resolved:  Off insulin drip. Started on low-dose Lantus, insulin. Non compliance  With medsHemoglobin A1c 15.3.Marland Kitchen And is followed by diabetes coordinator. Levemir dose is increased to 30 units daily, increase the NovoLog to 5 units 3 times a day with meals for coverage. Patient supposed to see endocrinologist as an outpatient.  2 left heel ulcer secondary to diabetes mellitus type 2: Seen by podiatry, has sepsis on admission secondary to this necrotic diabetic foot ulcer:  debridement of the ulcer by podiatry, continue dressing for the left heel,for angio  Of  Legs today,wbc coming down.  #3. Neuropathy; continue Neurontin  #4 hyponatremia is improving: GI , DVT prophylaxis 6. Dry cough: Flu test negative. WBC is coming down.  D/w wife Transfer to medical floor with off unti tele All the records are reviewed and case discussed with Care Management/Social Workerr. Management plans discussed with the patient, family and they are in agreement.  CODE STATUS: full  TOTAL TIME TAKING CARE OF THIS PATIENT: 66minutes.            POSSIBLE D/C IN 1-2 DAYS, DEPENDING ON CLINICAL CONDITION.   Epifanio Lesches M.D  on 05/20/2016 at 12:25 PM  Between 7am to 6pm - Pager - [phone]  After 6pm go to [email or web address] - password [email or web address] [place] [facility]  Office  [phone]  CC: Primary care physician; Kirk Ruths., MD   Note: This dictation was prepared with Dragon dictation along with smaller phrase technology. Any transcriptional errors that result from this process are unintentional.

## 2016-05-20 NOTE — Telephone Encounter (Signed)
Lm on vm for patient to call and make an appt to est. Care with Dr. [name]. (Only) This has been aprroved by administration.

## 2016-05-20 NOTE — Progress Notes (Signed)
This RN spoke with attending MD on patient morning Lantus order, received verbal order to give 10 units of Lantus due to patient being NPO.

## 2016-05-20 NOTE — Op Note (Signed)
[facility] Percutaneous Study/Intervention Procedural Note   Date of Surgery: 05/20/2016  Surgeon: Hortencia Pilar  Pre-operative Diagnosis: Atherosclerotic occlusive disease bilateral lower extremities with heel ulceration left lower extremity  Post-operative diagnosis: Same  Procedure(s) Performed: 1. Introduction catheter into left lower extremity 3rd order catheter placement  2. Contrast injection left lower extremity for distal runoff  3. Crosser atherectomy left posterior tibial artery unsuccessful 4. Star close closure right common femoral arteriotomy  Anesthesia: Conscious sedation was administered under my direct supervision. IV Versed plus fentanyl were utilized. Continuous ECG, pulse oximetry and blood pressure was monitored throughout the entire procedure.  Conscious sedation was for a total of 1 hour 50 minutes.  Sheath: 6 French Ansel sheath right common femoral artery  Contrast: 55 cc  Fluoroscopy Time: 20.8 minutes  Indications: Chase Clark presents with increasing pain of the left lower extremity. 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 This suggests the patient is having limb threatening ischemia. The risks and benefits are reviewed all questions answered patient agrees to proceed.  Procedure:Chase Clark is a 51 y.o. y.o. male who was identified and appropriate procedural time out was performed. The patient was then placed supine on the table and prepped and draped in the usual sterile fashion.   Ultrasound was placed in the sterile sleeve and the right groin was evaluated the right common femoral artery was echolucent and pulsatile indicating patency. Image was recorded for the permanent record and under real-time visualization a microneedle was inserted into the common femoral  artery microwire followed by a micro-sheath. A J-wire was then advanced through the micro-sheath and a 5 [place] sheath was then inserted over a J-wire. J-wire was then advanced and a 5 French pigtail catheter was positioned at the level of T12.  AP projection of the aorta was then obtained. Pigtail catheter was repositioned to above the bifurcation and a RAO view of the pelvis was obtained. Subsequently a pigtail catheter with the stiff angle Glidewire was used to cross the aortic bifurcation the catheter wire were advanced down into the left distal external iliac artery. Oblique view of the femoral bifurcation was then obtained and subsequently the wire was reintroduced and the pigtail catheter negotiated into the SFA representing third order catheter placement. Distal runoff was then performed.  5000 units of heparin was then given and allowed to circulate and a 6 [place] Ansel high flex sheath was advanced up and over the bifurcation and positioned in the femoral artery  Straight catheter and stiff angle Glidewire were then negotiated down into the distal popliteal. Catheter was then advanced past the origin of the anterior tibial. Hand injection contrast demonstrated the tibial anatomy in detail.  This identified the cul-de-sac of the posterior tibial and the wire was negotiated into the the posterior tibial catheter was then positioned at the cul-de-sac and the 035 wire was exchanged for an 018 wire. The Usher catheter was then prepped on the field and a Crosser S6 atherectomy catheter was prepped on the field.  Usher catheter was then advanced over the 0.018 wire and the S6 catheter inserted. Catheter was then tracked down to the level of the medial malleolus which was noted to be reconstitution of the posterior tibial. However small hand injection through the catheter demonstrated this was extravascular. Catheter was then repositioned up at the top and using a variety of different  catheters and wires  attempts to negotiate down the posterior tibial were made and in spite of these differing techniques none of the wires or catheters achieved with reentry in the distal posterior tibial.  After review of these images the sheath is pulled into the right external iliac oblique of the common femoral is obtained and a Star close device deployed. There no immediate Complications.  Findings: The abdominal aorta is opacified with a bolus injection contrast. Renal arteries are single and widely patent bilaterally. The aorta itself has diffuse disease but no hemodynamically significant lesions. The common and external iliac arteries are widely patent bilaterally.  The left common femoral is widely patent as is the profunda femoris.  The left SFA and popliteal artery are widely patent and demonstrated very minimal atherosclerotic changes anywhere along the course. The trifurcation is patent with the anterior tibial being the dominant artery and coursing all the way down to the foot filling the dorsalis pedis and the pedal arch. The peroneal is patent and looks quite good in its proximal half however distally it remains patent there are no focal hemodynamically significant lesions identified however it does become very small almost atretic looking. The posterior tibial occludes approximately 3 cm from its origin. There is reconstitution at the level of the medial malleolus. Subsequently, more distally at the terminus of the posterior tibial and the medial and lateral plantar branches there is fairly extensive disease as well.  Attempts at crossing the posterior tibial from essentially its origin down to the ankle did not yield intraluminal positioning and therefore the case was terminated.  Summary: unsuccessful recanalization left lower extremity.   Disposition: Patient was taken to the recovery room in stable condition having tolerated the procedure well.  Chase Clark 05/20/2016,6:49  PM

## 2016-05-21 ENCOUNTER — Encounter: Payer: Self-pay | Admitting: Vascular Surgery

## 2016-05-21 LAB — VANCOMYCIN, TROUGH: Vancomycin Tr: 19 ug/mL (ref 15–20)

## 2016-05-21 LAB — CREATININE, SERUM
Creatinine, Ser: 0.9 mg/dL (ref 0.61–1.24)
GFR calc Af Amer: >60 mL/min (ref >60)
GFR calc non Af Amer: >60 mL/min (ref >60)

## 2016-05-21 LAB — GLUCOSE, CAPILLARY
Glucose-Capillary: 227 mg/dL — ABNORMAL HIGH (ref 65–99)
Glucose-Capillary: 219 mg/dL — ABNORMAL HIGH (ref 65–99)

## 2016-05-21 MED ORDER — CHOLESTYRAMINE LIGHT 4 G PO PACK: 4.0000 g | PACK | Freq: Every day | ORAL | 0 refills | Status: DC

## 2016-05-21 MED ORDER — CLOPIDOGREL BISULFATE 75 MG PO TABS: 75.0000 mg | ORAL_TABLET | Freq: Every day | ORAL | 0 refills | Status: DC

## 2016-05-21 MED ORDER — DOXYCYCLINE HYCLATE 100 MG PO TABS: 100.0000 mg | ORAL_TABLET | Freq: Two times a day (BID) | ORAL | 0 refills | Status: DC

## 2016-05-21 MED ORDER — INSULIN ASPART 100 UNIT/ML ~~LOC~~ SOLN: 5.0000 [IU] | Freq: Three times a day (TID) | SUBCUTANEOUS | 1 refills | Status: DC

## 2016-05-21 MED ORDER — INSULIN GLARGINE 100 UNIT/ML ~~LOC~~ SOLN: 30.0000 [IU] | Freq: Every day | SUBCUTANEOUS | 1 refills | Status: DC

## 2016-05-21 MED ORDER — GABAPENTIN 300 MG PO CAPS: 600.0000 mg | ORAL_CAPSULE | Freq: Two times a day (BID) | ORAL | Status: DC

## 2016-05-21 MED ORDER — DIPHENOXYLATE-ATROPINE 2.5-0.025 MG PO TABS: 1.0000 | ORAL_TABLET | Freq: Four times a day (QID) | ORAL | 0 refills | Status: DC | PRN

## 2016-05-21 MED ORDER — CLOPIDOGREL BISULFATE 75 MG PO TABS
75.0000 mg | ORAL_TABLET | Freq: Every day | ORAL | Status: DC
  Administered 2016-05-21: 75 mg via ORAL
  Filled 2016-05-21: qty 1

## 2016-05-21 MED ORDER — ESOMEPRAZOLE MAGNESIUM 40 MG PO CPDR
20.0000 mg | DELAYED_RELEASE_CAPSULE | Freq: Every day | ORAL | Status: DC
Start: 2016-05-21 — End: 2017-09-17

## 2016-05-21 MED ORDER — COLLAGENASE 250 UNIT/GM EX OINT: TOPICAL_OINTMENT | Freq: Every day | CUTANEOUS | 0 refills | Status: DC

## 2016-05-21 MED ORDER — HYDROCODONE-ACETAMINOPHEN 5-325 MG PO TABS: 1.0000 | ORAL_TABLET | Freq: Four times a day (QID) | ORAL | 0 refills | Status: DC | PRN

## 2016-05-21 MED ORDER — DOXYCYCLINE HYCLATE 100 MG PO TABS: 100.0000 mg | ORAL_TABLET | Freq: Two times a day (BID) | ORAL | Status: DC

## 2016-05-21 NOTE — Progress Notes (Signed)
Discharge instructions reviewed with the patient and his wife.  Post op shoe placed on the patient.  IV removed.  Pt sent out via wheelchair with belongings

## 2016-05-21 NOTE — Care Management (Signed)
Patient admitted with DKA and left ulcer heel wound.  Patient to discharge home today.  Patient ordered home health PT, RN, and walker.   Patient lives at home with wife.  Independent of ADL's previously.  Walker to be delivered to room by Corene Cornea from [facility].  Home health agency preference offered to patient and wife.  However due to insurance coverage referral was made to [facility].  [facility] notified of referral. PCP Ouida Sills.  Pharmacy [facility]. RNCM signing off.

## 2016-05-21 NOTE — Consult Note (Signed)
Pharmacy Antibiotic Note  Chase Clark is a 51 y.o. male admitted on 05/17/2016 with diabetic foot ulcer.  Pharmacy has been consulted for vancomycin and zosyn dosing.  Currently ordered Vancomycin 1g IV q8h and Zosyn 3.375g IV q8h EI.  2/5 1330 Vancomycin trough 16 mcg/ml  2/7 AM vanc level 19. SCr slightly increased. Will recheck level before tomorrow 06:00 dose.  Plan: Continue with current dosing. Follow SCr closely. Will check a repeat Vancomycin trough and SCr with 05/21/16 AM labs.   Height: 5\' 10"  (177.8 cm) Weight: 193 lb 1.6 oz (87.6 kg) IBW/kg (Calculated) : 73  Temp (24hrs), Avg:99.6 F (37.6 C), Min:98.8 F (37.1 C), Max:100.6 F (38.1 C)   Recent Labs Lab 05/17/16 1342  05/18/16 0506 05/18/16 1112 05/19/16 0013 05/19/16 1322 05/20/16 0454 05/20/16 0455 05/21/16 0604  WBC 23.2*  --  15.8*  --  16.6*  --   --  14.8*  --   CREATININE 1.34*  < > 0.85 0.77 0.69  --  0.72  --  0.90  VANCOTROUGH  --   --   --   --   --  16  --   --  19  < > = values in this interval not displayed.  Estimated Creatinine Clearance: 107 mL/min (by C-G formula based on SCr of 0.9 mg/dL).    Allergies  Allergen Reactions  . Atorvastatin     Myalgias   . Codeine Sulfate Nausea Only    Antimicrobials this admission: vancomycin 2/3 >>  Zosyn 2/4 >>  Thank you for allowing pharmacy to be a part of this patient's care.  Paulina Fusi, PharmD, BCPS 05/21/2016 6:44 AM

## 2016-05-21 NOTE — Discharge Summary (Signed)
[place] [facility] [place] NAME: Chase Clark    MR#:  [id number]  DATE OF BIRTH:  1966/03/26  DATE OF ADMISSION:  05/17/2016 ADMITTING PHYSICIAN: Idelle Crouch, MD  DATE OF DISCHARGE: 05/21/2016  2:03 PM  PRIMARY CARE PHYSICIAN: Kirk Ruths., MD    ADMISSION DIAGNOSIS:  Diarrhea, unspecified type [R19.7] Diabetic ulcer of left heel associated with diabetes mellitus of other type, unspecified ulcer stage ([place]) [E13.621, L97.429]  DISCHARGE DIAGNOSIS:  Principal Problem:   DKA (diabetic ketoacidoses) ([place]) Active Problems:   Ulcer of foot ([place])   Chronic diarrhea   Hyponatremia   SECONDARY DIAGNOSIS:   Past Medical History:  Diagnosis Date  . Arthritis   . Asthma    as child  . Coronary artery disease   . Diabetes mellitus without complication ([place])   . Edema, lower extremity   . Fatty liver   . GERD (gastroesophageal reflux disease)   . Glaucoma   . Heart disease 2011   Patient has stent for 80% blockage  . Hyperlipidemia   . Hypertension   . Neuropathy ([place])   . Seasonal allergies   . Shoulder pain   . Ulcer of foot ([place]) 08.06.2014   DIABETIC ULCERATIONS ASSOCIATED WITH IRRITATION LATERAL ANKLE LEFT GREATER THAN RIGHT WITH MILD CELLULITIS    HOSPITAL COURSE:   1. Diabetic ketoacidosis this has resolved. Patient was on insulin drip initially. Patient is currently on Lantus 30 units daily and NovoLog 5 units prior to meals. Patient will follow-up with endocrinologist as outpatient. 2. Left heel ulcer secondary to diabetes. Seen by podiatry. Admitting physician listed sepsis on admission. Patient was on aggressive antibiotics and I'll sent home on doxycycline orally upon discharge. The patient also had an angiogram which was listed as unsuccessful. Follow-up with home health and Dr. Vickki Muff as outpatient. 3. Hyponatremia secondary to diabetic ketoacidosis improved. 4. Chronic diarrhea. Started on cholestyramine packets  as per GI 5. Neuropathy on Neurontin 6. Peripheral vascular disease on aspirin and Plavix started   DISCHARGE CONDITIONS:   Satisfactory  CONSULTS OBTAINED:  Treatment Team:  Samara Deist, DPM Rohini Raeanne Gathers, MD Katha Cabal, MD  DRUG ALLERGIES:   Allergies  Allergen Reactions  . Atorvastatin     Myalgias   . Codeine Sulfate Nausea Only    DISCHARGE MEDICATIONS:   Discharge Medication List as of 05/21/2016  9:53 AM    START taking these medications   Details  cholestyramine light (PREVALITE) 4 g packet Take 1 packet (4 g total) by mouth daily., Starting Wed 05/21/2016, Print    clopidogrel (PLAVIX) 75 MG tablet Take 1 tablet (75 mg total) by mouth daily., Starting Wed 05/21/2016, Print    collagenase (SANTYL) ointment Apply topically daily., Starting Wed 05/21/2016, Print    diphenoxylate-atropine (LOMOTIL) 2.5-0.025 MG tablet Take 1 tablet by mouth 4 (four) times daily as needed for diarrhea or loose stools., Starting Wed 05/21/2016, Print    doxycycline (VIBRA-TABS) 100 MG tablet Take 1 tablet (100 mg total) by mouth every 12 (twelve) hours., Starting Wed 05/21/2016, Print    HYDROcodone-acetaminophen (NORCO/VICODIN) 5-325 MG tablet Take 1 tablet by mouth every 6 (six) hours as needed for moderate pain., Starting Wed 05/21/2016, Print    insulin aspart (NOVOLOG) 100 UNIT/ML injection Inject 5 Units into the skin 3 (three) times daily with meals., Starting Wed 05/21/2016, Print      CONTINUE these medications which have CHANGED   Details  esomeprazole (NEXIUM) 40 MG  capsule Take 1 capsule (40 mg total) by mouth daily., Starting Wed 05/21/2016, No Print    gabapentin (NEURONTIN) 300 MG capsule Take 2 capsules (600 mg total) by mouth 2 (two) times daily., Starting Wed 05/21/2016, No Print    insulin glargine (LANTUS) 100 UNIT/ML injection Inject 0.3 mLs (30 Units total) into the skin daily., Starting Wed 05/21/2016, Print      CONTINUE these medications which have NOT  CHANGED   Details  aspirin EC 81 MG tablet Take 81 mg by mouth daily., Historical Med    brimonidine-timolol (COMBIGAN) 0.2-0.5 % ophthalmic solution Place 1 drop into both eyes 2 (two) times daily., Historical Med    Difluprednate (DUREZOL) 0.05 % EMUL Apply 1 drop to eye at bedtime. Left eye, Historical Med    latanoprost (XALATAN) 0.005 % ophthalmic solution Place 1 drop into both eyes at bedtime., Historical Med    lisinopril (PRINIVIL,ZESTRIL) 40 MG tablet Take 40 mg by mouth daily. , Starting Sat 08/18/2015, Historical Med    losartan-hydrochlorothiazide (HYZAAR) 100-25 MG tablet Take 1 tablet by mouth daily., Starting 07/15/2015, Until Discontinued, Historical Med    NON FORMULARY Artifical tears, Historical Med    glucosamine-chondroitin 500-400 MG tablet Take 1 tablet by mouth daily., Until Discontinued, Historical Med    Multiple Vitamin (MULTIVITAMIN) tablet Take 1 tablet by mouth daily., Until Discontinued, Historical Med      STOP taking these medications     insulin NPH Human (HUMULIN N,NOVOLIN N) 100 UNIT/ML injection      insulin regular (NOVOLIN R,HUMULIN R) 100 units/mL injection      metFORMIN (GLUCOPHAGE) 1000 MG tablet      Canagliflozin (INVOKANA) 100 MG TABS      glucose blood (BAYER CONTOUR NEXT TEST) test strip      glucose blood test strip      insulin lispro (HUMALOG) 100 UNIT/ML injection          DISCHARGE INSTRUCTIONS:   Follow-up PMD one week Follow-up with Dr. Vickki Muff 2 weeks Follow-up with Dr. [name] as outpatient  If you experience worsening of your admission symptoms, develop shortness of breath, life threatening emergency, suicidal or homicidal thoughts you must seek medical attention immediately by calling 911 or calling your MD immediately  if symptoms less severe.  You Must read complete instructions/literature along with all the possible adverse reactions/side effects for all the Medicines you take and that have been prescribed to  you. Take any new Medicines after you have completely understood and accept all the possible adverse reactions/side effects.   Please note  You were cared for by a hospitalist during your hospital stay. If you have any questions about your discharge medications or the care you received while you were in the hospital after you are discharged, you can call the unit and asked to speak with the hospitalist on call if the hospitalist that took care of you is not available. Once you are discharged, your primary care physician will handle any further medical issues. Please note that NO REFILLS for any discharge medications will be authorized once you are discharged, as it is imperative that you return to your primary care physician (or establish a relationship with a primary care physician if you do not have one) for your aftercare needs so that they can reassess your need for medications and monitor your lab values.    Today   CHIEF COMPLAINT:   Chief Complaint  Patient presents with  . Diarrhea    HISTORY OF  PRESENT ILLNESS:  Chase Clark  is a 51 y.o. male with a known history of Diabetes comes in with diarrhea, full thoughts are and found to be in diabetic ketoacidosis   VITAL SIGNS:  Blood pressure 114/80, pulse 92, temperature 99.7 F (37.6 C), temperature source Oral, resp. rate 16, height 5\' 10"  (1.778 m), weight 87.6 kg (193 lb 1.6 oz), SpO2 98 %.    PHYSICAL EXAMINATION:  GENERAL:  51 y.o.-year-old patient lying in the bed with no acute distress.  EYES: Pupils equal, round, reactive to light and accommodation. No scleral icterus. Extraocular muscles intact.  HEENT: Head atraumatic, normocephalic. Oropharynx and nasopharynx clear.  NECK:  Supple, no jugular venous distention. No thyroid enlargement, no tenderness.  LUNGS: Normal breath sounds bilaterally, no wheezing, rales,rhonchi or crepitation. No use of accessory muscles of respiration.  CARDIOVASCULAR: S1, S2 normal. No  murmurs, rubs, or gallops.  ABDOMEN: Soft, non-tender, non-distended. Bowel sounds present. No organomegaly or mass.  EXTREMITIES: No pedal edema, cyanosis, or clubbing.  NEUROLOGIC: Cranial nerves II through XII are intact. Muscle strength 5/5 in all extremities. Sensation intact. Gait not checked.  PSYCHIATRIC: The patient is alert and oriented x 3.  SKIN: Right heel covered  DATA REVIEW:   CBC  Recent Labs Lab 05/20/16 0455  WBC 14.8*  HGB 10.3*  HCT 30.0*  PLT 310    Chemistries   Recent Labs Lab 05/18/16 0506  05/20/16 0454 05/21/16 0604  NA 131*  < > 132*  --   K 3.3*  < > 3.7  --   CL 101  < > 98*  --   CO2 22  < > 25  --   GLUCOSE 153*  < > 210*  --   BUN 19  < > 7  --   CREATININE 0.85  < > 0.72 0.90  CALCIUM 8.7*  < > 8.6*  --   MG  --   --  1.3*  --   AST 12*  --   --   --   ALT 9*  --   --   --   ALKPHOS 88  --   --   --   BILITOT 0.9  --   --   --   < > = values in this interval not displayed.  Cardiac Enzymes No results for input(s): TROPONINI in the last 168 hours.  Microbiology Results  Results for orders placed or performed during the hospital encounter of 05/17/16  MRSA PCR Screening     Status: None   Collection Time: 05/17/16  7:50 PM  Result Value Ref Range Status   MRSA by PCR NEGATIVE NEGATIVE Final    Comment:        The GeneXpert MRSA Assay (FDA approved for NASAL specimens only), is one component of a comprehensive MRSA colonization surveillance program. It is not intended to diagnose MRSA infection nor to guide or monitor treatment for MRSA infections.   CULTURE, BLOOD (ROUTINE X 2) w Reflex to ID Panel     Status: None (Preliminary result)   Collection Time: 05/19/16 12:13 AM  Result Value Ref Range Status   Specimen Description BLOOD LEFT HAND  Final   Special Requests   Final    BOTTLES DRAWN AEROBIC AND ANAEROBIC 10CCAERO,6CCANA   Culture NO GROWTH 2 DAYS  Final   Report Status PENDING  Incomplete  CULTURE, BLOOD  (ROUTINE X 2) w Reflex to ID Panel     Status: None (Preliminary result)  Collection Time: 05/19/16 12:13 AM  Result Value Ref Range Status   Specimen Description BLOOD RIGHT HAND  Final   Special Requests   Final    BOTTLES DRAWN AEROBIC AND ANAEROBIC 14CCAERO,9CCANA   Culture NO GROWTH 2 DAYS  Final   Report Status PENDING  Incomplete    RADIOLOGY:  Mr Ankle Left Wo Contrast  Result Date: 05/20/2016 CLINICAL DATA:  Diabetic left heel ulcer. EXAM: MRI OF THE LEFT ANKLE WITHOUT CONTRAST TECHNIQUE: Multiplanar, multisequence MR imaging of the ankle was performed. No intravenous contrast was administered. COMPARISON:  Radiographs dated 05/17/2016 FINDINGS: TENDONS Peroneal: Small amount of fluid in the tendon sheaths. Posteromedial: Small amount of fluid in the tendon sheaths. Anterior: Intact tibialis anterior, extensor hallucis longus and extensor digitorum longus tendons. Achilles: Intact. Plantar Fascia: No acute abnormality. LIGAMENTS Lateral: Intact. Medial: Intact. CARTILAGE Ankle Joint: No joint effusion or chondral defect. Subtalar Joints/Sinus Tarsi: No joint effusion or chondral defect. Bones: There is no osteomyelitis or other significant bone abnormality. Superficial soft tissue ulceration overlying the posterior aspect of the calcaneus. Other: None IMPRESSION: 1. No evidence of osteomyelitis. Superficial soft tissue ulceration on the heel. 2. Minimal tenosynovitis involving the peroneal and posteromedial tendons of the ankle. Electronically Signed   By: Lorriane Shire M.D.   On: 05/20/2016 11:25     Management plans discussed with the patient, family and they are in agreement.  CODE STATUS:     Code Status Orders        Start     Ordered   05/17/16 1945  Full code  Continuous     05/17/16 1944    Code Status History    Date Active Date Inactive Code Status Order ID Comments User Context   This patient has a current code status but no historical code status.      TOTAL  TIME TAKING CARE OF THIS PATIENT: 35 minutes.    Loletha Grayer M.D on 05/21/2016 at 4:57 PM  Between 7am to 6pm - Pager - [phone]  After 6pm go to [email or web address] - password [email or web address]  [facility] Office  [phone]  CC: Primary care physician; Kirk Ruths., MD

## 2016-05-22 NOTE — Telephone Encounter (Signed)
Dr. Derrel Nip is requesting that this patient be reinstated to the practice.

## 2016-05-23 DIAGNOSIS — E11621 Type 2 diabetes mellitus with foot ulcer: Secondary | ICD-10-CM | POA: Diagnosis not present

## 2016-05-23 DIAGNOSIS — I10 Essential (primary) hypertension: Secondary | ICD-10-CM | POA: Diagnosis not present

## 2016-05-23 DIAGNOSIS — L97429 Non-pressure chronic ulcer of left heel and midfoot with unspecified severity: Secondary | ICD-10-CM | POA: Diagnosis not present

## 2016-05-24 LAB — CULTURE, BLOOD (ROUTINE X 2)
Culture: NO GROWTH
Culture: NO GROWTH

## 2016-05-26 DIAGNOSIS — E11621 Type 2 diabetes mellitus with foot ulcer: Secondary | ICD-10-CM | POA: Diagnosis not present

## 2016-05-26 DIAGNOSIS — I10 Essential (primary) hypertension: Secondary | ICD-10-CM | POA: Diagnosis not present

## 2016-05-26 DIAGNOSIS — L97429 Non-pressure chronic ulcer of left heel and midfoot with unspecified severity: Secondary | ICD-10-CM | POA: Diagnosis not present

## 2016-05-27 ENCOUNTER — Telehealth (INDEPENDENT_AMBULATORY_CARE_PROVIDER_SITE_OTHER): Payer: Self-pay | Admitting: Vascular Surgery

## 2016-05-27 DIAGNOSIS — E11621 Type 2 diabetes mellitus with foot ulcer: Secondary | ICD-10-CM | POA: Diagnosis not present

## 2016-05-27 DIAGNOSIS — I10 Essential (primary) hypertension: Secondary | ICD-10-CM | POA: Diagnosis not present

## 2016-05-27 DIAGNOSIS — L97429 Non-pressure chronic ulcer of left heel and midfoot with unspecified severity: Secondary | ICD-10-CM | POA: Diagnosis not present

## 2016-05-27 NOTE — Telephone Encounter (Signed)
Patient's wife called and stated that she left a message about pain medication. She says that Dr Delana Meyer did angio on her husband and he needs something for pain. She was hoping by the end of the day. [phone]

## 2016-05-28 DIAGNOSIS — I10 Essential (primary) hypertension: Secondary | ICD-10-CM | POA: Diagnosis not present

## 2016-05-28 DIAGNOSIS — E11621 Type 2 diabetes mellitus with foot ulcer: Secondary | ICD-10-CM | POA: Diagnosis not present

## 2016-05-28 DIAGNOSIS — L97429 Non-pressure chronic ulcer of left heel and midfoot with unspecified severity: Secondary | ICD-10-CM | POA: Diagnosis not present

## 2016-05-30 DIAGNOSIS — E11621 Type 2 diabetes mellitus with foot ulcer: Secondary | ICD-10-CM | POA: Diagnosis not present

## 2016-05-30 DIAGNOSIS — L97429 Non-pressure chronic ulcer of left heel and midfoot with unspecified severity: Secondary | ICD-10-CM | POA: Diagnosis not present

## 2016-05-30 DIAGNOSIS — I10 Essential (primary) hypertension: Secondary | ICD-10-CM | POA: Diagnosis not present

## 2016-06-02 DIAGNOSIS — L97429 Non-pressure chronic ulcer of left heel and midfoot with unspecified severity: Secondary | ICD-10-CM | POA: Diagnosis not present

## 2016-06-02 DIAGNOSIS — E11621 Type 2 diabetes mellitus with foot ulcer: Secondary | ICD-10-CM | POA: Diagnosis not present

## 2016-06-02 DIAGNOSIS — I10 Essential (primary) hypertension: Secondary | ICD-10-CM | POA: Diagnosis not present

## 2016-06-02 NOTE — Telephone Encounter (Signed)
Very unusual for pain medication after angio would need to be seen

## 2016-06-04 ENCOUNTER — Ambulatory Visit: Payer: 59 | Admitting: Vascular Surgery

## 2016-06-04 DIAGNOSIS — I10 Essential (primary) hypertension: Secondary | ICD-10-CM | POA: Diagnosis not present

## 2016-06-04 DIAGNOSIS — L97429 Non-pressure chronic ulcer of left heel and midfoot with unspecified severity: Secondary | ICD-10-CM | POA: Diagnosis not present

## 2016-06-04 DIAGNOSIS — E11621 Type 2 diabetes mellitus with foot ulcer: Secondary | ICD-10-CM | POA: Diagnosis not present

## 2016-06-06 DIAGNOSIS — E11621 Type 2 diabetes mellitus with foot ulcer: Secondary | ICD-10-CM | POA: Diagnosis not present

## 2016-06-06 DIAGNOSIS — L97429 Non-pressure chronic ulcer of left heel and midfoot with unspecified severity: Secondary | ICD-10-CM | POA: Diagnosis not present

## 2016-06-06 DIAGNOSIS — I10 Essential (primary) hypertension: Secondary | ICD-10-CM | POA: Diagnosis not present

## 2016-06-09 ENCOUNTER — Ambulatory Visit (INDEPENDENT_AMBULATORY_CARE_PROVIDER_SITE_OTHER): Payer: 59 | Admitting: Vascular Surgery

## 2016-06-09 ENCOUNTER — Ambulatory Visit (INDEPENDENT_AMBULATORY_CARE_PROVIDER_SITE_OTHER): Payer: 59

## 2016-06-09 ENCOUNTER — Ambulatory Visit: Payer: 59 | Admitting: *Deleted

## 2016-06-09 ENCOUNTER — Other Ambulatory Visit (INDEPENDENT_AMBULATORY_CARE_PROVIDER_SITE_OTHER): Payer: Self-pay | Admitting: Vascular Surgery

## 2016-06-09 ENCOUNTER — Encounter (INDEPENDENT_AMBULATORY_CARE_PROVIDER_SITE_OTHER): Payer: Self-pay | Admitting: Vascular Surgery

## 2016-06-09 VITALS — BP 140/95 | HR 79 | Resp 17 | Ht 70.0 in | Wt 196.2 lb

## 2016-06-09 DIAGNOSIS — L97429 Non-pressure chronic ulcer of left heel and midfoot with unspecified severity: Secondary | ICD-10-CM | POA: Diagnosis not present

## 2016-06-09 DIAGNOSIS — T81718A Complication of other artery following a procedure, not elsewhere classified, initial encounter: Secondary | ICD-10-CM

## 2016-06-09 DIAGNOSIS — L97321 Non-pressure chronic ulcer of left ankle limited to breakdown of skin: Secondary | ICD-10-CM | POA: Diagnosis not present

## 2016-06-09 DIAGNOSIS — I739 Peripheral vascular disease, unspecified: Secondary | ICD-10-CM

## 2016-06-09 DIAGNOSIS — E11621 Type 2 diabetes mellitus with foot ulcer: Secondary | ICD-10-CM | POA: Diagnosis not present

## 2016-06-09 DIAGNOSIS — E118 Type 2 diabetes mellitus with unspecified complications: Secondary | ICD-10-CM

## 2016-06-09 DIAGNOSIS — I729 Aneurysm of unspecified site: Secondary | ICD-10-CM

## 2016-06-09 DIAGNOSIS — I25119 Atherosclerotic heart disease of native coronary artery with unspecified angina pectoris: Secondary | ICD-10-CM

## 2016-06-09 DIAGNOSIS — L97502 Non-pressure chronic ulcer of other part of unspecified foot with fat layer exposed: Secondary | ICD-10-CM | POA: Diagnosis not present

## 2016-06-09 DIAGNOSIS — I10 Essential (primary) hypertension: Secondary | ICD-10-CM | POA: Diagnosis not present

## 2016-06-09 LAB — VAS US LOWER EXTREMITY ARTERIAL DUPLEX
LEFT PERO DIST DIA: -17 cm/s
LEFT PERO DIST SYS: -201 cm/s
LEFT POPLITEAL PROX DYS VEL: 19 cm/s
LEFT SFA DIST DYS VEL: -18 cm/s
LEFT SFA MID VEL: -16 cm/s
Left ant tibial distal sys: 125 cm/s
Left popliteal dist sys PSV: -209 cm/s
Left popliteal prox sys PSV: 162 cm/s
Left super femoral dist sys PSV: -150 cm/s
Left super femoral mid sys PSV: -97 cm/s
Left super femoral prox sys PSV: 97 cm/s
left ant tibial distal dia: 26 cm/s

## 2016-06-11 NOTE — Progress Notes (Signed)
MRN : [id number]  Chase Clark is a 51 y.o. (1966/02/21) male who presents with chief complaint of  Chief Complaint  Patient presents with  . Follow-up  .  History of Present Illness: The patient returns to the office for followup and review of the noninvasive studies. There has been pain in the calf since the angiogram.  His heel is unchanged.  The patient denies interval shortening of their claudication distance and development of mild rest pain symptoms. No new ulcers or wounds have occurred since the last visit.  There have been no significant changes to the patient's overall health care.  The patient denies amaurosis fugax or recent TIA symptoms. There are no recent neurological changes noted. The patient denies history of DVT, PE or superficial thrombophlebitis. The patient denies recent episodes of angina or shortness of breath.   Duplex US of the lower extremity arterial system shows patent arterial system consistent with angiogram findings no discrepancies   Current Meds  Medication Sig  . aspirin EC 81 MG tablet Take 81 mg by mouth daily.  . brimonidine-timolol (COMBIGAN) 0.2-0.5 % ophthalmic solution Place 1 drop into both eyes 2 (two) times daily.  . cholestyramine light (PREVALITE) 4 g packet Take 1 packet (4 g total) by mouth daily.  . clopidogrel (PLAVIX) 75 MG tablet Take 1 tablet (75 mg total) by mouth daily.  . collagenase (SANTYL) ointment Apply topically daily.  . Difluprednate (DUREZOL) 0.05 % EMUL Apply 1 drop to eye at bedtime. Left eye  . diphenoxylate-atropine (LOMOTIL) 2.5-0.025 MG tablet Take 1 tablet by mouth 4 (four) times daily as needed for diarrhea or loose stools.  Marland Kitchen doxycycline (VIBRA-TABS) 100 MG tablet Take 1 tablet (100 mg total) by mouth every 12 (twelve) hours.  Marland Kitchen esomeprazole (NEXIUM) 40 MG capsule Take 1 capsule (40 mg total) by mouth daily.  Marland Kitchen gabapentin (NEURONTIN) 300 MG capsule Take 2 capsules (600 mg total) by mouth 2 (two) times  daily.  Marland Kitchen glucosamine-chondroitin 500-400 MG tablet Take 1 tablet by mouth daily.  Marland Kitchen HYDROcodone-acetaminophen (NORCO/VICODIN) 5-325 MG tablet Take 1 tablet by mouth every 6 (six) hours as needed for moderate pain.  Marland Kitchen insulin aspart (NOVOLOG) 100 UNIT/ML injection Inject 5 Units into the skin 3 (three) times daily with meals.  . insulin glargine (LANTUS) 100 UNIT/ML injection Inject 0.3 mLs (30 Units total) into the skin daily.  Marland Kitchen latanoprost (XALATAN) 0.005 % ophthalmic solution Place 1 drop into both eyes at bedtime.  Marland Kitchen lisinopril (PRINIVIL,ZESTRIL) 40 MG tablet Take 40 mg by mouth daily.   Marland Kitchen losartan-hydrochlorothiazide (HYZAAR) 100-25 MG tablet Take 1 tablet by mouth daily.  . metFORMIN (GLUCOPHAGE) 1000 MG tablet Take by mouth.  . Multiple Vitamin (MULTIVITAMIN) tablet Take 1 tablet by mouth daily.  . NON FORMULARY Artifical tears    Past Medical History:  Diagnosis Date  . Arthritis   . Asthma    as child  . Coronary artery disease   . Diabetes mellitus without complication ([place])   . Edema, lower extremity   . Fatty liver   . GERD (gastroesophageal reflux disease)   . Glaucoma   . Heart disease 2011   Patient has stent for 80% blockage  . Hyperlipidemia   . Hypertension   . Neuropathy ([place])   . Seasonal allergies   . Shoulder pain   . Ulcer of foot ([place]) 08.06.2014   DIABETIC ULCERATIONS ASSOCIATED WITH IRRITATION LATERAL ANKLE LEFT GREATER THAN RIGHT WITH MILD CELLULITIS  Past Surgical History:  Procedure Laterality Date  . ABDOMINAL AORTOGRAM N/A 05/20/2016   Procedure: Abdominal Aortogram possible intervention;  Surgeon: Katha Cabal, MD;  Location: [place] [facility];  Service: Cardiovascular;  Laterality: N/A;  . CARDIAC CATHETERIZATION    . CATARACT EXTRACTION W/ INTRAOCULAR LENS IMPLANT    . CHOLECYSTECTOMY N/A   . CORONARY ANGIOPLASTY WITH STENT PLACEMENT  2007   1st diagonal  . EPIBLEPHERON REPAIR WITH TEAR DUCT PROBING    . EYE SURGERY Right sept  29n2015   cataract extraction  . INSERTION EXPRESS TUBE SHUNT Right 09/06/2015   Procedure: INSERTION AHMED TUBE SHUNT with tutoplast allograft;  Surgeon: Eulogio Bear, MD;  Location: [facility];  Service: Ophthalmology;  Laterality: Right;  Marland Kitchen VASECTOMY      Social History Social History  Substance Use Topics  . Smoking status: Former Research scientist (life sciences)  . Smokeless tobacco: Never Used  . Alcohol use Yes    Family History Family History  Problem Relation Age of Onset  . Hyperlipidemia Mother   . Diabetes Mother   . Hyperlipidemia Father   . Diabetes Father   . Heart disease Father   . Hypertension Father   No family history of bleeding/clotting disorders, porphyria or autoimmune disease   Allergies  Allergen Reactions  . Atorvastatin     Myalgias   . Codeine Sulfate Nausea Only     REVIEW OF SYSTEMS (Negative unless checked)  Constitutional: [] Weight loss  [] Fever  [] Chills Cardiac: [] Chest pain   [] Chest pressure   [] Palpitations   [] Shortness of breath when laying flat   [] Shortness of breath with exertion. Vascular:  [] Pain in legs with walking   [] Pain in legs at rest  [] History of DVT   [] Phlebitis   [x] Swelling in legs   [] Varicose veins   [] Non-healing ulcers Pulmonary:   [] Uses home oxygen   [] Productive cough   [] Hemoptysis   [] Wheeze  [] COPD   [] Asthma Neurologic:  [] Dizziness   [] Seizures   [] History of stroke   [] History of TIA  [] Aphasia   [] Vissual changes   [] Weakness or numbness in arm   [] Weakness or numbness in leg Musculoskeletal:   [x] Joint swelling   [] Joint pain   [] Low back pain Hematologic:  [] Easy bruising  [] Easy bleeding   [] Hypercoagulable state   [] Anemic Gastrointestinal:  [] Diarrhea   [] Vomiting  [] Gastroesophageal reflux/heartburn   [] Difficulty swallowing. Genitourinary:  [] Chronic kidney disease   [] Difficult urination  [] Frequent urination   [] Blood in urine Skin:  [] Rashes   [x] Ulcers  Psychological:  [] History of anxiety   []  History of major  depression.  Physical Examination  Vitals:   06/09/16 1628  BP: (!) 140/95  Pulse: 79  Resp: 17  Weight: 196 lb 3.2 oz (89 kg)  Height: 5\' 10"  (1.778 m)   Body mass index is 28.15 kg/m. Gen: WD/WN, NAD Head: [place]/AT, No temporalis wasting.  Ear/Nose/Throat: Hearing grossly intact, nares w/o erythema or drainage, poor dentition Eyes: PER, EOMI, sclera nonicteric.  Neck: Supple, no masses.  No bruit or JVD.  Pulmonary:  Good air movement, clear to auscultation bilaterally, no use of accessory muscles.  Cardiac: RRR, normal S1, S2, no Murmurs. Vascular: left heel ulcer Vessel Right Left  Radial Palpable Palpable  Ulnar Palpable Palpable  Brachial Palpable Palpable  Carotid Palpable Palpable  Femoral Palpable Palpable  Popliteal Palpable Palpable  PT 1+ Palpable Not Palpable  DP 1+ Palpable 1+ Palpable   Gastrointestinal: soft, non-distended. No guarding/no peritoneal signs.  Musculoskeletal: M/S 5/5 throughout.  No deformity or atrophy.  Neurologic: CN 2-12 intact. Pain and light touch intact in extremities.  Symmetrical.  Speech is fluent. Motor exam as listed above. Psychiatric: Judgment intact, Mood & affect appropriate for pt's clinical situation. Dermatologic: No rashes or ulcers noted.  No changes consistent with cellulitis. Lymph : No Cervical lymphadenopathy, no lichenification or skin changes of chronic lymphedema.  CBC Lab Results  Component Value Date   WBC 14.8 (H) 05/20/2016   HGB 10.3 (L) 05/20/2016   HCT 30.0 (L) 05/20/2016   MCV 87.8 05/20/2016   PLT 310 05/20/2016    BMET    Component Value Date/Time   NA 132 (L) 05/20/2016 0454   K 3.7 05/20/2016 0454   K 4.5 12/29/2013 1521   CL 98 (L) 05/20/2016 0454   CO2 25 05/20/2016 0454   GLUCOSE 210 (H) 05/20/2016 0454   BUN 7 05/20/2016 0454   CREATININE 0.90 05/21/2016 0604   CREATININE 1.15 06/10/2013 1605   CALCIUM 8.6 (L) 05/20/2016 0454   GFRNONAA >60 05/21/2016 0604   GFRAA >60 05/21/2016 0604    CrCl cannot be calculated (Patient's most recent lab result is older than the maximum 21 days allowed.).  COAG Lab Results  Component Value Date   INR 1.0 10/22/2006    Radiology Mr Ankle Left Wo Contrast  Result Date: 05/20/2016 CLINICAL DATA:  Diabetic left heel ulcer. EXAM: MRI OF THE LEFT ANKLE WITHOUT CONTRAST TECHNIQUE: Multiplanar, multisequence MR imaging of the ankle was performed. No intravenous contrast was administered. COMPARISON:  Radiographs dated 05/17/2016 FINDINGS: TENDONS Peroneal: Small amount of fluid in the tendon sheaths. Posteromedial: Small amount of fluid in the tendon sheaths. Anterior: Intact tibialis anterior, extensor hallucis longus and extensor digitorum longus tendons. Achilles: Intact. Plantar Fascia: No acute abnormality. LIGAMENTS Lateral: Intact. Medial: Intact. CARTILAGE Ankle Joint: No joint effusion or chondral defect. Subtalar Joints/Sinus Tarsi: No joint effusion or chondral defect. Bones: There is no osteomyelitis or other significant bone abnormality. Superficial soft tissue ulceration overlying the posterior aspect of the calcaneus. Other: None IMPRESSION: 1. No evidence of osteomyelitis. Superficial soft tissue ulceration on the heel. 2. Minimal tenosynovitis involving the peroneal and posteromedial tendons of the ankle. Electronically Signed   By: Lorriane Shire M.D.   On: 05/20/2016 11:25   Dg Foot Complete Left  Result Date: 05/17/2016 CLINICAL DATA:  Soft tissue ulcer over the calcaneus EXAM: LEFT FOOT - COMPLETE 3+ VIEW COMPARISON:  11/17/2012 FINDINGS: No acute fracture or dislocation is noted. The soft tissue ulcer is noted. No underlying bony erosive changes are identified to suggest osteomyelitis. Vascular calcifications are seen. IMPRESSION: Soft tissue wound without evidence of osteomyelitis. Electronically Signed   By: Inez Catalina M.D.   On: 05/17/2016 17:32    Assessment/Plan 1. Ulcer of foot with fat layer exposed, unspecified  laterality ([place]) Recommend:  The patient is status post angiogram.   The patient denies lifestyle limiting changes at this point in time.  No further invasive studies, angiography or surgery at this time The patient should continue walking and begin a more formal exercise program.  The patient should continue antiplatelet therapy and aggressive treatment of the lipid abnormalities  Smoking cessation was again discussed  The patient should continue wearing graduated compression socks 10-15 mmHg strength to control the mild edema.  Patient should undergo noninvasive studies as ordered. The patient will follow up with me after the studies.    2. Essential hypertension Continue antihypertensive medications as already  ordered, these medications have been reviewed and there are no changes at this time.  3. Atherosclerosis of native coronary artery of native heart with angina pectoris ([place]) Continue cardiac and antihypertensive medications as already ordered and reviewed, no changes at this time.  Continue statin as ordered and reviewed, no changes at this time  Nitrates PRN for chest pain   4. DM (diabetes mellitus) with complications ([place]) Continue hypoglycemic medications as already ordered, these medications have been reviewed and there are no changes at this time.  Hgb A1C to be monitored as already arranged by primary service     Hortencia Pilar, MD  06/11/2016 8:49 PM

## 2016-06-13 DIAGNOSIS — I10 Essential (primary) hypertension: Secondary | ICD-10-CM | POA: Diagnosis not present

## 2016-06-13 DIAGNOSIS — E11621 Type 2 diabetes mellitus with foot ulcer: Secondary | ICD-10-CM | POA: Diagnosis not present

## 2016-06-13 DIAGNOSIS — L97429 Non-pressure chronic ulcer of left heel and midfoot with unspecified severity: Secondary | ICD-10-CM | POA: Diagnosis not present

## 2016-06-17 ENCOUNTER — Ambulatory Visit: Payer: 59 | Admitting: Dietician

## 2016-06-17 DIAGNOSIS — L97509 Non-pressure chronic ulcer of other part of unspecified foot with unspecified severity: Secondary | ICD-10-CM | POA: Diagnosis not present

## 2016-06-17 DIAGNOSIS — L97423 Non-pressure chronic ulcer of left heel and midfoot with necrosis of muscle: Secondary | ICD-10-CM | POA: Diagnosis not present

## 2016-06-17 DIAGNOSIS — E11621 Type 2 diabetes mellitus with foot ulcer: Secondary | ICD-10-CM | POA: Diagnosis not present

## 2016-06-19 ENCOUNTER — Encounter (INDEPENDENT_AMBULATORY_CARE_PROVIDER_SITE_OTHER): Payer: Commercial Managed Care - PPO | Admitting: Vascular Surgery

## 2016-06-19 ENCOUNTER — Encounter (INDEPENDENT_AMBULATORY_CARE_PROVIDER_SITE_OTHER): Payer: Commercial Managed Care - PPO

## 2016-06-19 DIAGNOSIS — I10 Essential (primary) hypertension: Secondary | ICD-10-CM | POA: Diagnosis not present

## 2016-06-19 DIAGNOSIS — L97429 Non-pressure chronic ulcer of left heel and midfoot with unspecified severity: Secondary | ICD-10-CM | POA: Diagnosis not present

## 2016-06-19 DIAGNOSIS — E11621 Type 2 diabetes mellitus with foot ulcer: Secondary | ICD-10-CM | POA: Diagnosis not present

## 2016-06-20 ENCOUNTER — Telehealth (INDEPENDENT_AMBULATORY_CARE_PROVIDER_SITE_OTHER): Payer: Self-pay

## 2016-06-20 NOTE — Telephone Encounter (Signed)
Patient's wife called stating that the pharmacy has faxed over the prescription for the Plavix twice and still no answer, I called the Plavix into the pharmacy at [address]  Then returned the call and left a message that the prescription has been called in.

## 2016-06-21 ENCOUNTER — Emergency Department
Admission: EM | Admit: 2016-06-21 | Discharge: 2016-06-21 | Disposition: A | Discharge status: Home / Self Care | Payer: 59 | Attending: Student in an Organized Health Care Education/Training Program | Admitting: Student in an Organized Health Care Education/Training Program

## 2016-06-21 DIAGNOSIS — Z794 Long term (current) use of insulin: Secondary | ICD-10-CM | POA: Insufficient documentation

## 2016-06-21 DIAGNOSIS — Z79899 Other long term (current) drug therapy: Secondary | ICD-10-CM | POA: Diagnosis not present

## 2016-06-21 DIAGNOSIS — M79672 Pain in left foot: Secondary | ICD-10-CM | POA: Diagnosis present

## 2016-06-21 DIAGNOSIS — I10 Essential (primary) hypertension: Secondary | ICD-10-CM | POA: Insufficient documentation

## 2016-06-21 DIAGNOSIS — J45909 Unspecified asthma, uncomplicated: Secondary | ICD-10-CM | POA: Insufficient documentation

## 2016-06-21 DIAGNOSIS — L97423 Non-pressure chronic ulcer of left heel and midfoot with necrosis of muscle: Secondary | ICD-10-CM | POA: Insufficient documentation

## 2016-06-21 DIAGNOSIS — L97429 Non-pressure chronic ulcer of left heel and midfoot with unspecified severity: Secondary | ICD-10-CM | POA: Diagnosis not present

## 2016-06-21 DIAGNOSIS — Z87891 Personal history of nicotine dependence: Secondary | ICD-10-CM | POA: Diagnosis not present

## 2016-06-21 DIAGNOSIS — L97529 Non-pressure chronic ulcer of other part of left foot with unspecified severity: Secondary | ICD-10-CM

## 2016-06-21 DIAGNOSIS — I2510 Atherosclerotic heart disease of native coronary artery without angina pectoris: Secondary | ICD-10-CM | POA: Insufficient documentation

## 2016-06-21 DIAGNOSIS — Z7982 Long term (current) use of aspirin: Secondary | ICD-10-CM | POA: Diagnosis not present

## 2016-06-21 DIAGNOSIS — L97521 Non-pressure chronic ulcer of other part of left foot limited to breakdown of skin: Secondary | ICD-10-CM | POA: Diagnosis not present

## 2016-06-21 DIAGNOSIS — E11621 Type 2 diabetes mellitus with foot ulcer: Secondary | ICD-10-CM | POA: Diagnosis not present

## 2016-06-21 MED ORDER — OXYCODONE-ACETAMINOPHEN 5-325 MG PO TABS: 1.0000 | ORAL_TABLET | Freq: Four times a day (QID) | ORAL | 0 refills | Status: DC | PRN

## 2016-06-21 NOTE — ED Notes (Signed)
Pt verbalized understanding of discharge instructions. NAD at this time. 

## 2016-06-21 NOTE — ED Provider Notes (Signed)
[facility] Emergency Department Provider Note  ____________________________________________  Time seen: Approximately 12:17 PM  I have reviewed the triage vital signs and the nursing notes.   HISTORY  Chief Complaint Foot Pain    HPI Chase Clark is a 51 y.o. male that presents to the emergency department with a bleeding left foot ulcer. Patient states that yesterday he began to feel a lot better so was walking on his foot more than normal. Patient states that he felt a rip and the side of his food and the ulcer began to bleed.  Patient states that he has neuropathy and is having a difficult time telling where the pain is coming from. Patient has been seeing Dr. Vickki Muff with podiatry for foot ulcer. He is on his second day of Augmentin since redness around ulcer was extending a little bit and now has a line drawn marking the redness.. Patient states that he has had fasciitis 2 times in the past and is concerned for this. He was hospitalized earlier this month for ketoacidosis after influenza and a GI bug. Patient has been taking Percocet for pain and has 2 pills left. Patient takes Plavix. Patient has appointment with wound care on Monday. He denies fever, shortness of breath, chest pain, nausea, vomiting, abdominal pain.  Past Medical History:  Diagnosis Date  . Arthritis   . Asthma    as child  . Coronary artery disease   . Diabetes mellitus without complication ([place])   . Edema, lower extremity   . Fatty liver   . GERD (gastroesophageal reflux disease)   . Glaucoma   . Heart disease 2011   Patient has stent for 80% blockage  . Hyperlipidemia   . Hypertension   . Neuropathy ([place])   . Seasonal allergies   . Shoulder pain   . Ulcer of foot ([place]) 08.06.2014   DIABETIC ULCERATIONS ASSOCIATED WITH IRRITATION LATERAL ANKLE LEFT GREATER THAN RIGHT WITH MILD CELLULITIS    Patient Active Problem List   Diagnosis Date Noted  . DKA (diabetic ketoacidoses)  ([place]) 05/17/2016  . Chronic diarrhea 05/17/2016  . Hyponatremia 05/17/2016  . Anemia 01/12/2014  . Disorder of ligament of ankle 07/31/2013  . Edema 06/28/2013  . 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 Obesity (BMI 30-39.9) 06/12/2013  . Pain in joint, shoulder region 06/12/2013  . Statin intolerance 06/10/2013  . Ulcer of foot ([place])   . DM (diabetes mellitus) with complications ([place]) 85/27/7824  . DM (diabetes mellitus), type 2, uncontrolled w/ophthalmic complication ([place]) 23/53/6144  . Hyperlipidemia associated with type 2 diabetes mellitus ([place]) 07/17/2006  . GILBERT'S SYNDROME 07/17/2006  . Essential hypertension 07/17/2006  . Coronary atherosclerosis 07/17/2006  . OSTEOARTHRITIS 07/17/2006  . COUGH 07/17/2006    Past Surgical History:  Procedure Laterality Date  . ABDOMINAL AORTOGRAM N/A 05/20/2016   Procedure: Abdominal Aortogram possible intervention;  Surgeon: Katha Cabal, MD;  Location: [place] [facility];  Service: Cardiovascular;  Laterality: N/A;  . CARDIAC CATHETERIZATION    . CATARACT EXTRACTION W/ INTRAOCULAR LENS IMPLANT    . CHOLECYSTECTOMY N/A   . CORONARY ANGIOPLASTY WITH STENT PLACEMENT  2007   1st diagonal  . EPIBLEPHERON REPAIR WITH TEAR DUCT PROBING    . EYE SURGERY Right sept 29n2015   cataract extraction  . INSERTION EXPRESS TUBE SHUNT Right 09/06/2015   Procedure: INSERTION AHMED TUBE SHUNT with tutoplast allograft;  Surgeon: Eulogio Bear, MD;  Location: [facility];  Service: Ophthalmology;  Laterality: Right;  .  VASECTOMY      Prior to Admission medications   Medication Sig Start Date End Date Taking? Authorizing Provider  aspirin EC 81 MG tablet Take 81 mg by mouth daily.    Historical Provider, MD  brimonidine-timolol (COMBIGAN) 0.2-0.5 % ophthalmic solution Place 1 drop into both eyes 2 (two) times daily.    Historical Provider, MD  cholestyramine light (PREVALITE) 4 g packet Take 1 packet (4 g total) by mouth daily. 05/21/16    Loletha Grayer, MD  clopidogrel (PLAVIX) 75 MG tablet Take 1 tablet (75 mg total) by mouth daily. 05/21/16   Loletha Grayer, MD  collagenase (SANTYL) ointment Apply topically daily. 05/21/16   Loletha Grayer, MD  Difluprednate (DUREZOL) 0.05 % EMUL Apply 1 drop to eye at bedtime. Left eye    Historical Provider, MD  diphenoxylate-atropine (LOMOTIL) 2.5-0.025 MG tablet Take 1 tablet by mouth 4 (four) times daily as needed for diarrhea or loose stools. 05/21/16   Loletha Grayer, MD  doxycycline (VIBRA-TABS) 100 MG tablet Take 1 tablet (100 mg total) by mouth every 12 (twelve) hours. 05/21/16   Loletha Grayer, MD  esomeprazole (NEXIUM) 40 MG capsule Take 1 capsule (40 mg total) by mouth daily. 05/21/16   Loletha Grayer, MD  gabapentin (NEURONTIN) 300 MG capsule Take 2 capsules (600 mg total) by mouth 2 (two) times daily. 05/21/16   Loletha Grayer, MD  glucosamine-chondroitin 500-400 MG tablet Take 1 tablet by mouth daily.    Historical Provider, MD  HYDROcodone-acetaminophen (NORCO/VICODIN) 5-325 MG tablet Take 1 tablet by mouth every 6 (six) hours as needed for moderate pain. 05/21/16   Loletha Grayer, MD  insulin aspart (NOVOLOG) 100 UNIT/ML injection Inject 5 Units into the skin 3 (three) times daily with meals. 05/21/16   Loletha Grayer, MD  insulin glargine (LANTUS) 100 UNIT/ML injection Inject 0.3 mLs (30 Units total) into the skin daily. 05/21/16   Loletha Grayer, MD  latanoprost (XALATAN) 0.005 % ophthalmic solution Place 1 drop into both eyes at bedtime.    Historical Provider, MD  lisinopril (PRINIVIL,ZESTRIL) 40 MG tablet Take 40 mg by mouth daily.  08/18/15   Historical Provider, MD  losartan-hydrochlorothiazide (HYZAAR) 100-25 MG tablet Take 1 tablet by mouth daily. 07/15/15   Historical Provider, MD  metFORMIN (GLUCOPHAGE) 1000 MG tablet Take by mouth. 04/20/15   Historical Provider, MD  Multiple Vitamin (MULTIVITAMIN) tablet Take 1 tablet by mouth daily.    Historical Provider, MD  NON FORMULARY  Artifical tears    Historical Provider, MD  oxyCODONE-acetaminophen (ROXICET) 5-325 MG tablet Take 1 tablet by mouth every 6 (six) hours as needed. 06/21/16 06/21/17  Laban Emperor, PA-C    Allergies Atorvastatin and Codeine sulfate  Family History  Problem Relation Age of Onset  . Hyperlipidemia Mother   . Diabetes Mother   . Hyperlipidemia Father   . Diabetes Father   . Heart disease Father   . Hypertension Father     Social History Social History  Substance Use Topics  . Smoking status: Former Research scientist (life sciences)  . Smokeless tobacco: Never Used  . Alcohol use Yes     Review of Systems  Constitutional: No fever/chills ENT: No upper respiratory complaints. Cardiovascular: No chest pain. Respiratory: No cough. No SOB. Gastrointestinal: No abdominal pain.  No nausea, no vomiting.  Skin: Negative for ecchymosis. Neurological: Negative for headaches   ____________________________________________   PHYSICAL EXAM:  VITAL SIGNS: ED Triage Vitals  Enc Vitals Group     BP 06/21/16 1139 (!) 145/81  Pulse Rate 06/21/16 1139 (!) 113     Resp 06/21/16 1139 20     Temp 06/21/16 1139 98.1 F (36.7 C)     Temp Source 06/21/16 1139 Oral     SpO2 06/21/16 1139 100 %     Weight 06/21/16 1142 196 lb (88.9 kg)     Height 06/21/16 1142 5\' 10"  (1.778 m)     Head Circumference --      Peak Flow --      Pain Score 06/21/16 1142 6     Pain Loc --      Pain Edu? --      Excl. in [place]? --      Constitutional: Alert and oriented. Well appearing and in no acute distress. Eyes: Conjunctivae are normal. PERRL. EOMI. Head: Atraumatic. ENT:      Ears:      Nose: No congestion/rhinnorhea.      Mouth/Throat: Mucous membranes are moist.  Neck: No stridor.   Cardiovascular: Normal rate, regular rhythm.  Good peripheral circulation. Respiratory: Normal respiratory effort without tachypnea or retractions. Lungs CTAB. Good air entry to the bases with no decreased or absent breath  sounds. Musculoskeletal: Full range of motion to all extremities. No gross deformities appreciated. Neurologic:  Normal speech and language. No gross focal neurologic deficits are appreciated.  Skin:  Skin is warm, dry. 2.5 inch necrotic area on left heel. Mild bleeding noted on the inside of foot at wound line. Erythema surrounding area of necrosis but inside of the pen line. No tenderness to palpation. Psychiatric: Mood and affect are normal. Speech and behavior are normal. Patient exhibits appropriate insight and judgement.   ____________________________________________   LABS (all labs ordered are listed, but only abnormal results are displayed)  Labs Reviewed - No data to display ____________________________________________  EKG   ____________________________________________  RADIOLOGY  No results found.  ____________________________________________    PROCEDURES  Procedure(s) performed:    Procedures    Medications - No data to display   ____________________________________________   INITIAL IMPRESSION / ASSESSMENT AND PLAN / ED COURSE  Pertinent labs & imaging results that were available during my care of the patient were reviewed by me and considered in my medical decision making (see chart for details).  Review of the [place] CSRS was performed in accordance of the [place] prior to dispensing any controlled drugs.  Patient's diagnosis is consistent with foot ulcer. Vital signs and exam are reassuring. Patient has close follow-up with podiatrist and wound care. I do not think there is indication for imaging at this time. Area of cellulitis appears to be improving from where pen line was drawn. Patient is on Augmentin. Patient has appointment with wound care on Monday. Patient is also going to make an appointment with Dr. Vickki Muff this week. Pressure was applied to wound and dressing was placed. Patient was instructed not to bear any weight on the foot. Crutches were  given. Patient will be discharged home with prescriptions for 3 doses of Roxicet until patient has wound appointment on Monday. Patient is given ED precautions to return to the ED for any worsening or new symptoms.     ____________________________________________  FINAL CLINICAL IMPRESSION(S) / ED DIAGNOSES  Final diagnoses:  Ulcer of left foot, unspecified ulcer stage ([place])      NEW MEDICATIONS STARTED DURING THIS VISIT:  Discharge Medication List as of 06/21/2016 12:45 PM    START taking these medications   Details  oxyCODONE-acetaminophen (ROXICET) 5-325 MG tablet Take 1  tablet by mouth every 6 (six) hours as needed., Starting Sat 06/21/2016, Until Sun 06/21/2017, Print            This chart was dictated using voice recognition software/Dragon. Despite best efforts to proofread, errors can occur which can change the meaning. Any change was purely unintentional.    Laban Emperor, PA-C 06/21/16 [place], MD 06/21/16 [id number]

## 2016-06-21 NOTE — ED Notes (Signed)
Pt states dx with L sided foot ulcer and had it debrided at the beginning of this year. Pt states yesterday was walking on his L feet and felt a rip in his heel. Pt has large ulcer to L heel, skin is noted to be black, with some bleeding noted to the outer portion of his wound. Wound bed with a black center at this time. Wound begins on back of his heel and progresses down under his foot approx 2.5 inches from back of foot.

## 2016-06-21 NOTE — ED Triage Notes (Signed)
Pt reports to ED w/ c/o L foot pain.  Pt sts that he is being treated for ulcer on L foot, stated that yesterday he put weight on L foot and felt a "rip". Pt alert and oriented, denies CP, SOB fevers or LOC.  Pt presents w/ L boot. Pt sts that foot does not feel any different besides pain.

## 2016-06-23 ENCOUNTER — Encounter: Payer: 59 | Attending: Surgery | Admitting: Surgery

## 2016-06-23 DIAGNOSIS — I10 Essential (primary) hypertension: Secondary | ICD-10-CM | POA: Insufficient documentation

## 2016-06-23 DIAGNOSIS — Z794 Long term (current) use of insulin: Secondary | ICD-10-CM | POA: Diagnosis not present

## 2016-06-23 DIAGNOSIS — E1165 Type 2 diabetes mellitus with hyperglycemia: Secondary | ICD-10-CM | POA: Insufficient documentation

## 2016-06-23 DIAGNOSIS — L97423 Non-pressure chronic ulcer of left heel and midfoot with necrosis of muscle: Secondary | ICD-10-CM | POA: Diagnosis not present

## 2016-06-23 DIAGNOSIS — Z79899 Other long term (current) drug therapy: Secondary | ICD-10-CM | POA: Diagnosis not present

## 2016-06-23 DIAGNOSIS — L97421 Non-pressure chronic ulcer of left heel and midfoot limited to breakdown of skin: Secondary | ICD-10-CM | POA: Diagnosis not present

## 2016-06-23 DIAGNOSIS — E785 Hyperlipidemia, unspecified: Secondary | ICD-10-CM | POA: Insufficient documentation

## 2016-06-23 DIAGNOSIS — E1140 Type 2 diabetes mellitus with diabetic neuropathy, unspecified: Secondary | ICD-10-CM | POA: Diagnosis not present

## 2016-06-23 DIAGNOSIS — Z87891 Personal history of nicotine dependence: Secondary | ICD-10-CM | POA: Diagnosis not present

## 2016-06-23 DIAGNOSIS — E1152 Type 2 diabetes mellitus with diabetic peripheral angiopathy with gangrene: Secondary | ICD-10-CM | POA: Diagnosis not present

## 2016-06-23 DIAGNOSIS — E11621 Type 2 diabetes mellitus with foot ulcer: Secondary | ICD-10-CM | POA: Diagnosis not present

## 2016-06-23 DIAGNOSIS — I70244 Atherosclerosis of native arteries of left leg with ulceration of heel and midfoot: Secondary | ICD-10-CM | POA: Diagnosis not present

## 2016-06-23 DIAGNOSIS — M1990 Unspecified osteoarthritis, unspecified site: Secondary | ICD-10-CM | POA: Diagnosis not present

## 2016-06-23 DIAGNOSIS — I2510 Atherosclerotic heart disease of native coronary artery without angina pectoris: Secondary | ICD-10-CM | POA: Insufficient documentation

## 2016-06-23 NOTE — Progress Notes (Signed)
Chase Clark, Chase Clark ([id number]) Visit Report for 06/23/2016 Abuse/Suicide Risk Screen Details Patient Name: Chase Clark, Chase Clark Date of Service: 06/23/2016 9:00 AM Medical Record Number: [id number] Patient Account Number: [id number] Date of Birth/Sex: Mar 22, 1966 (51 y.o. Male) Treating RN: Montey Hora Primary Care Provider: [name], ERIC Other Clinician: Referring Provider: Samara Deist Treating Provider/Extender: Frann Rider in Treatment: 0 Abuse/Suicide Risk Screen Items Answer ABUSE/SUICIDE RISK SCREEN: Has anyone close to you tried to hurt or harm you recentlyo No Do you feel uncomfortable with anyone in your familyo No Has anyone forced you do things that you didnot want to doo No Do you have any thoughts of harming yourselfo No Patient displays signs or symptoms of abuse and/or neglect. No Electronic Signature(s) Signed: 06/23/2016 2:38:29 PM By: Montey Hora Entered By: Montey Hora on 06/23/2016 09:17:26 Chase Clark ([id number]) -------------------------------------------------------------------------------- Activities of Daily Living Details Patient Name: Chase Clark Date of Service: 06/23/2016 9:00 AM Medical Record Number: [id number] Patient Account Number: [id number] Date of Birth/Sex: [birth year] (51 y.o. Male) Treating RN: Montey Hora Primary Care Provider: [name], ERIC Other Clinician: Referring Provider: Samara Deist Treating Provider/Extender: Frann Rider in Treatment: 0 Activities of Daily Living Items Answer Activities of Daily Living (Please select one for each item) Drive Automobile Not Able Take Medications Completely Able Use Telephone Completely Able Care for Appearance Completely Able Use Toilet Completely Able Bath / Shower Completely Able Dress Self Completely Able Feed Self Completely Able Walk Completely Able Get In / Out Bed Completely Able Housework Completely Able Prepare Meals Completely  [place] for Self Completely Able Electronic Signature(s) Signed: 06/23/2016 2:38:29 PM By: Montey Hora Entered By: Montey Hora on 06/23/2016 09:17:58 Chase Clark ([id number]) -------------------------------------------------------------------------------- Education Assessment Details Patient Name: Chase Clark Date of Service: 06/23/2016 9:00 AM Medical Record Number: [id number] Patient Account Number: [id number] Date of Birth/Sex: 1966/03/22 (51 y.o. Male) Treating RN: Montey Hora Primary Care Provider: [name], ERIC Other Clinician: Referring Provider: Samara Deist Treating Provider/Extender: Frann Rider in Treatment: 0 Primary Learner Assessed: Caregiver Reason Patient is not Primary Learner: spouse Learning Preferences/Education Level/Primary Language Learning Preference: Explanation, Demonstration Highest Education Level: College or Above Preferred Language: English Cognitive Barrier Assessment/Beliefs Language Barrier: No Translator Needed: No Memory Deficit: No Emotional Barrier: No Cultural/Religious Beliefs Affecting Medical No Care: Physical Barrier Assessment Impaired Vision: No Impaired Hearing: No Decreased Hand dexterity: No Knowledge/Comprehension Assessment Knowledge Level: Medium Comprehension Level: Medium Ability to understand written Medium instructions: Ability to understand verbal Medium instructions: Motivation Assessment Anxiety Level: Calm Cooperation: Cooperative Education Importance: Acknowledges Need Interest in Health Problems: Asks Questions Perception: Coherent Willingness to Engage in Self- Medium Management Activities: Readiness to Engage in Self- Medium Management Activities: Chase Clark, Chase Clark ([id number]) Electronic Signature(s) Signed: 06/23/2016 2:38:29 PM By: Montey Hora Entered By: Montey Hora on 06/23/2016 09:19:05 Chase Clark  ([id number]) -------------------------------------------------------------------------------- Fall Risk Assessment Details Patient Name: Chase Clark Date of Service: 06/23/2016 9:00 AM Medical Record Number: [id number] Patient Account Number: [id number] Date of Birth/Sex: Aug 24, 1965 (51 y.o. Male) Treating RN: Montey Hora Primary Care Provider: [name], ERIC Other Clinician: Referring Provider: Samara Deist Treating Provider/Extender: Frann Rider in Treatment: 0 Fall Risk Assessment Items Have you had 2 or more falls in the last 12 monthso 0 No Have you had any fall that resulted in injury in the last 12 monthso 0 No FALL RISK ASSESSMENT: History of falling - immediate or within 3 months 0 No Secondary diagnosis 0 No Ambulatory aid None/bed  rest/wheelchair/nurse 0 No Crutches/cane/walker 15 Yes Furniture 0 No IV Access/Saline Lock 0 No Gait/Training Normal/bed rest/immobile 0 No Weak 10 Yes Impaired 0 No Mental Status Oriented to own ability 0 Yes Electronic Signature(s) Signed: 06/23/2016 2:38:29 PM By: Montey Hora Entered By: Montey Hora on 06/23/2016 09:19:18 Chase Clark ([id number]) -------------------------------------------------------------------------------- Foot Assessment Details Patient Name: Chase Clark Date of Service: 06/23/2016 9:00 AM Medical Record Number: [id number] Patient Account Number: [id number] Date of Birth/Sex: 31-May-1965 (51 y.o. Male) Treating RN: Montey Hora Primary Care Provider: [name], ERIC Other Clinician: Referring Provider: Samara Deist Treating Provider/Extender: Frann Rider in Treatment: 0 Foot Assessment Items Site Locations + = Sensation present, - = Sensation absent, C = Callus, U = Ulcer R = Redness, W = Warmth, M = Maceration, PU = Pre-ulcerative lesion F = Fissure, S = Swelling, D = Dryness Assessment Right: Left: Other Deformity: No No Prior Foot Ulcer: No No Prior  Amputation: No No Charcot Joint: No No Ambulatory Status: Ambulatory With Help Assistance Device: Walker Gait: Unsteady Notes knee scooter Electronic Signature(s) Signed: 06/23/2016 2:38:29 PM By: Montey Hora Entered By: Montey Hora on 06/23/2016 09:20:10 Chase Clark ([id number]) Chase Clark ([id number]) -------------------------------------------------------------------------------- Nutrition Risk Assessment Details Patient Name: Chase Clark Date of Service: 06/23/2016 9:00 AM Medical Record Number: [id number] Patient Account Number: [id number] Date of Birth/Sex: 1966-02-07 (51 y.o. Male) Treating RN: Montey Hora Primary Care Provider: [name], ERIC Other Clinician: Referring Provider: Samara Deist Treating Provider/Extender: Frann Rider in Treatment: 0 Height (in): 71 Weight (lbs): 195 Body Mass Index (BMI): 27.2 Nutrition Risk Assessment Items NUTRITION RISK SCREEN: I have an illness or condition that made me change the kind and/or 0 No amount of food I eat I eat fewer than two meals per day 0 No I eat few fruits and vegetables, or milk products 0 No I have three or more drinks of beer, liquor or wine almost every day 0 No I have tooth or mouth problems that make it hard for me to eat 0 No I don't always have enough money to buy the food I need 0 No I eat alone most of the time 0 No I take three or more different prescribed or over-the-counter drugs a 1 Yes day Without wanting to, I have lost or gained 10 pounds in the last six 0 No months I am not always physically able to shop, cook and/or feed myself 0 No Nutrition Protocols Good Risk Protocol 0 No interventions needed Moderate Risk Protocol Electronic Signature(s) Signed: 06/23/2016 2:38:29 PM By: Montey Hora Entered By: Montey Hora on 06/23/2016 09:19:27

## 2016-06-23 NOTE — Progress Notes (Signed)
MCKENNON, ZWART ([id number]) Visit Report for 06/23/2016 Allergy List Details Patient Name: Chase Clark, Chase Clark Date of Service: 06/23/2016 9:00 AM Medical Record Number: [id number] Patient Account Number: [id number] Date of Birth/Sex: [birth year] (50 y.o. Male) Treating RN: Montey Hora Primary Care Provider: [name], ERIC Other Clinician: Referring Provider: Samara Deist Treating Provider/Extender: Frann Rider in Treatment: 0 Allergies Active Allergies Statins-Hmg-Coa Reductase Inhibitors Reaction: muscle pain Allergy Notes Electronic Signature(s) Signed: 06/23/2016 2:38:29 PM By: Montey Hora Entered By: Montey Hora on 06/23/2016 09:17:14 Chase Clark ([id number]) -------------------------------------------------------------------------------- Arrival Information Details Patient Name: Chase Clark Date of Service: 06/23/2016 9:00 AM Medical Record Number: [id number] Patient Account Number: [id number] Date of Birth/Sex: 1965/06/19 (51 y.o. Male) Treating RN: Montey Hora Primary Care Provider: [name], ERIC Other Clinician: Referring Provider: Samara Deist Treating Provider/Extender: Frann Rider in Treatment: 0 Visit Information Patient Arrived: Other Arrival Time: 09:11 Accompanied By: spouse Transfer Assistance: None Patient Identification Verified: Yes Secondary Verification Process Completed: Yes Patient Has Alerts: Yes Patient Alerts: DMII Electronic Signature(s) Signed: 06/23/2016 2:38:29 PM By: Montey Hora Entered By: Montey Hora on 06/23/2016 09:11:48 Chase Clark ([id number]) -------------------------------------------------------------------------------- Clinic Level of Care Assessment Details Patient Name: Chase Clark Date of Service: 06/23/2016 9:00 AM Medical Record Number: [id number] Patient Account Number: [id number] Date of Birth/Sex: [birth year] (51 y.o. Male) Treating RN: Montey Hora Primary Care  Provider: [name], ERIC Other Clinician: Referring Provider: Samara Deist Treating Provider/Extender: Frann Rider in Treatment: 0 Clinic Level of Care Assessment Items TOOL 2 Quantity Score []  - Use when only an EandM is performed on the INITIAL visit 0 ASSESSMENTS - Nursing Assessment / Reassessment X - General Physical Exam (combine w/ comprehensive assessment (listed just 1 20 below) when performed on new pt. evals) X - Comprehensive Assessment (HX, ROS, Risk Assessments, Wounds Hx, etc.) 1 25 ASSESSMENTS - Wound and Skin Assessment / Reassessment X - Simple Wound Assessment / Reassessment - one wound 1 5 []  - Complex Wound Assessment / Reassessment - multiple wounds 0 []  - Dermatologic / Skin Assessment (not related to wound area) 0 ASSESSMENTS - Ostomy and/or Continence Assessment and Care []  - Incontinence Assessment and Management 0 []  - Ostomy Care Assessment and Management (repouching, etc.) 0 PROCESS - Coordination of Care X - Simple Patient / Family Education for ongoing care 1 15 []  - Complex (extensive) Patient / Family Education for ongoing care 0 []  - Staff obtains Programmer, systems, Records, Test Results / Process Orders 0 []  - Staff telephones HHA, Nursing Homes / Clarify orders / etc 0 []  - Routine Transfer to another Facility (non-emergent condition) 0 []  - Routine Hospital Admission (non-emergent condition) 0 []  - New Admissions / Biomedical engineer / Ordering NPWT, Apligraf, etc. 0 []  - Emergency Hospital Admission (emergent condition) 0 X - Simple Discharge Coordination 1 10 Chase Clark, Chase A. ([id number]) []  - Complex (extensive) Discharge Coordination 0 PROCESS - Special Needs []  - Pediatric / Minor Patient Management 0 []  - Isolation Patient Management 0 []  - Hearing / Language / Visual special needs 0 []  - Assessment of Community assistance (transportation, D/C planning, etc.) 0 []  - Additional assistance / Altered mentation 0 []  - Support  Surface(s) Assessment (bed, cushion, seat, etc.) 0 INTERVENTIONS - Wound Cleansing / Measurement X - Wound Imaging (photographs - any number of wounds) 1 5 []  - Wound Tracing (instead of photographs) 0 X - Simple Wound Measurement - one wound 1 5 []  - Complex Wound Measurement - multiple wounds 0 X - Simple Wound  Cleansing - one wound 1 5 []  - Complex Wound Cleansing - multiple wounds 0 INTERVENTIONS - Wound Dressings X - Small Wound Dressing one or multiple wounds 1 10 []  - Medium Wound Dressing one or multiple wounds 0 []  - Large Wound Dressing one or multiple wounds 0 []  - Application of Medications - injection 0 INTERVENTIONS - Miscellaneous []  - External ear exam 0 []  - Specimen Collection (cultures, biopsies, blood, body fluids, etc.) 0 []  - Specimen(s) / Culture(s) sent or taken to Lab for analysis 0 []  - Patient Transfer (multiple staff / Harrel Lemon Lift / Similar devices) 0 []  - Simple Staple / Suture removal (25 or less) 0 []  - Complex Staple / Suture removal (26 or more) 0 Chase Clark, Chase A. ([id number]) []  - Hypo / Hyperglycemic Management (close monitor of Blood Glucose) 0 X - Ankle / Brachial Index (ABI) - do not check if billed separately 1 15 Has the patient been seen at the hospital within the last three years: Yes Total Score: 115 Level Of Care: New/Established - Level 3 Electronic Signature(s) Signed: 06/23/2016 2:38:29 PM By: Montey Hora Entered By: Montey Hora on 06/23/2016 10:11:13 Chase Clark ([id number]) -------------------------------------------------------------------------------- Encounter Discharge Information Details Patient Name: Chase Clark Date of Service: 06/23/2016 9:00 AM Medical Record Number: [id number] Patient Account Number: [id number] Date of Birth/Sex: 07-Jul-1965 (51 y.o. Male) Treating RN: Montey Hora Primary Care Provider: [name], ERIC Other Clinician: Referring Provider: Samara Deist Treating Provider/Extender:  Frann Rider in Treatment: 0 Encounter Discharge Information Items Discharge Pain Level: 0 Discharge Condition: Stable Ambulatory Status: Walker Discharge Destination: Home Transportation: Private Auto Accompanied By: spouse Schedule Follow-up Appointment: Yes Medication Reconciliation completed No and provided to Patient/Care Provider: Provided on Clinical Summary of Care: 06/23/2016 Form Type Recipient Paper Patient BJ Notes knee walker Electronic Signature(s) Signed: 06/23/2016 10:28:56 AM By: Ruthine Dose Entered By: Ruthine Dose on 06/23/2016 10:28:56 Chase Clark ([id number]) -------------------------------------------------------------------------------- Lower Extremity Assessment Details Patient Name: Chase Clark Date of Service: 06/23/2016 9:00 AM Medical Record Number: [id number] Patient Account Number: [id number] Date of Birth/Sex: 02/23/1966 (52 y.o. Male) Treating RN: Montey Hora Primary Care Provider: [name], ERIC Other Clinician: Referring Provider: Samara Deist Treating Provider/Extender: Frann Rider in Treatment: 0 Edema Assessment Assessed: [Left: No] [Right: No] Edema: [Left: N] [Right: o] Calf Left: Right: Point of Measurement: 35 cm From Medial Instep 34.2 cm cm Ankle Left: Right: Point of Measurement: 12 cm From Medial Instep 22.5 cm cm Vascular Assessment Pulses: Dorsalis Pedis Palpable: [Left:Yes] Doppler Audible: [Left:Yes] Posterior Tibial Palpable: [Left:No] Doppler Audible: [Left:Yes] Extremity colors, hair growth, and conditions: Extremity Color: [Left:Pale] Hair Growth on Extremity: [Left:Yes] Temperature of Extremity: [Left:Warm] Capillary Refill: [Left:< 3 seconds] Blood Pressure: Brachial: [Left:102] Dorsalis Pedis: 138 [Left:Dorsalis Pedis:] Ankle: Posterior Tibial: 126 [Left:Posterior Tibial: 1.35] Toe Nail Assessment Left: Right: Thick: Yes Discolored: No Deformed: No Improper  Length and Hygiene: No Chase Clark, Chase Clark ([id number]) Electronic Signature(s) Signed: 06/23/2016 2:38:29 PM By: Montey Hora Entered By: Montey Hora on 06/23/2016 09:44:14 Chase Clark ([id number]) -------------------------------------------------------------------------------- Multi Wound Chart Details Patient Name: Chase Clark Date of Service: 06/23/2016 9:00 AM Medical Record Number: [id number] Patient Account Number: [id number] Date of Birth/Sex: 08/29/1965 (50 y.o. Male) Treating RN: Montey Hora Primary Care Provider: [name], ERIC Other Clinician: Referring Provider: Samara Deist Treating Provider/Extender: Frann Rider in Treatment: 0 Vital Signs Height(in): 71 Pulse(bpm): 101 Weight(lbs): 195 Blood Pressure 105/82 (mmHg): Body Mass Index(BMI): 27 Temperature(F): 98.4 Respiratory Rate 18 (breaths/min): Photos: [N/A:N/A] Wound Location:  Left Calcaneus N/A N/A Wounding Event: Gradually Appeared N/A N/A Primary Etiology: Arterial Insufficiency Ulcer N/A N/A Comorbid History: Coronary Artery Disease, N/A N/A Hypertension, Type II Diabetes, Osteoarthritis, Neuropathy Date Acquired: 04/25/2016 N/A N/A Weeks of Treatment: 0 N/A N/A Wound Status: Open N/A N/A Pending Amputation on Yes N/A N/A Presentation: Measurements L x W x D 6.5x13.8x0.2 N/A N/A (cm) Area (cm) : 70.45 N/A N/A Volume (cm) : 14.09 N/A N/A Classification: Full Thickness Without N/A N/A Exposed Support Structures Chase Clark, Chase A. ([id number]) HBO Classification: Grade 1 N/A N/A Exudate Amount: Large N/A N/A Exudate Type: Serous N/A N/A Exudate Color: amber N/A N/A Foul Odor After Yes N/A N/A Cleansing: Odor Anticipated Due to No N/A N/A Product Use: Wound Margin: Flat and Intact N/A N/A Granulation Amount: None Present (0%) N/A N/A Necrotic Amount: Large (67-100%) N/A N/A Necrotic Tissue: Eschar N/A N/A Exposed Structures: Fascia: No N/A N/A Fat Layer  (Subcutaneous Tissue) Exposed: No Tendon: No Muscle: No Joint: No Bone: No Limited to Skin Breakdown Epithelialization: None N/A N/A Periwound Skin Texture: Excoriation: No N/A N/A Induration: No Callus: No Crepitus: No Rash: No Scarring: No Periwound Skin Maceration: No N/A N/A Moisture: Dry/Scaly: No Periwound Skin Color: Erythema: Yes N/A N/A Atrophie Blanche: No Cyanosis: No Ecchymosis: No Hemosiderin Staining: No Mottled: No Pallor: No Rubor: No Erythema Location: Circumferential N/A N/A Temperature: No Abnormality N/A N/A Tenderness on Yes N/A N/A Palpation: Wound Preparation: Ulcer Cleansing: N/A N/A Rinsed/Irrigated with Saline Topical Anesthetic Applied: Other: lidocaine 4% Chase Clark, Chase A. ([id number]) Treatment Notes Wound #1 (Left Calcaneus) 1. Cleansed with: Clean wound with Normal Saline 2. Anesthetic Topical Lidocaine 4% cream to wound bed prior to debridement 4. Dressing Applied: Santyl Ointment 5. Secondary Dressing Applied Guaze, ABD and kerlix/Conform 7. Secured with Recruitment consultant) Signed: 06/23/2016 10:57:34 AM By: Christin Fudge MD, FACS Entered By: Christin Fudge on 06/23/2016 10:57:34 Chase Clark ([id number]) -------------------------------------------------------------------------------- [place] Details Patient Name: Chase Clark Date of Service: 06/23/2016 9:00 AM Medical Record Number: [id number] Patient Account Number: [id number] Date of Birth/Sex: 11-14-1965 (51 y.o. Male) Treating RN: Montey Hora Primary Care Provider: [name], ERIC Other Clinician: Referring Provider: Samara Deist Treating Provider/Extender: Frann Rider in Treatment: 0 Active Inactive ` Abuse / Safety / Falls / Self Care Management Nursing Diagnoses: Impaired physical mobility Goals: Patient will remain injury free Date Initiated: 06/23/2016 Target Resolution Date: 09/19/2016 Goal Status:  Active Interventions: Assess fall risk on admission and as needed Notes: ` Orientation to the Wound Care Program Nursing Diagnoses: Knowledge deficit related to the wound healing center program Goals: Patient/caregiver will verbalize understanding of the [place] Date Initiated: 06/23/2016 Target Resolution Date: 09/18/2016 Goal Status: Active Interventions: Provide education on orientation to the wound center Notes: ` Wound/Skin Impairment Nursing Diagnoses: Impaired tissue integrity Chase Clark, Chase Clark ([id number]) Goals: Patient/caregiver will verbalize understanding of skin care regimen Date Initiated: 06/23/2016 Target Resolution Date: 09/19/2016 Goal Status: Active Ulcer/skin breakdown will have a volume reduction of 30% by week 4 Date Initiated: 06/23/2016 Target Resolution Date: 09/19/2016 Goal Status: Active Ulcer/skin breakdown will have a volume reduction of 50% by week 8 Date Initiated: 06/23/2016 Target Resolution Date: 09/19/2016 Goal Status: Active Ulcer/skin breakdown will have a volume reduction of 80% by week 12 Date Initiated: 06/23/2016 Target Resolution Date: 09/19/2016 Goal Status: Active Ulcer/skin breakdown will heal within 14 weeks Date Initiated: 06/23/2016 Target Resolution Date: 09/19/2016 Goal Status: Active Interventions: Assess patient/caregiver ability to obtain necessary supplies Assess patient/caregiver ability  to perform ulcer/skin care regimen upon admission and as needed Assess ulceration(s) every visit Notes: Electronic Signature(s) Signed: 06/23/2016 2:38:29 PM By: Montey Hora Entered By: Montey Hora on 06/23/2016 09:59:27 Chase Clark ([id number]) -------------------------------------------------------------------------------- Pain Assessment Details Patient Name: Chase Clark Date of Service: 06/23/2016 9:00 AM Medical Record Number: [id number] Patient Account Number: [id number] Date of Birth/Sex: 12/17/1965  (51 y.o. Male) Treating RN: Montey Hora Primary Care Provider: [name], ERIC Other Clinician: Referring Provider: Samara Deist Treating Provider/Extender: Frann Rider in Treatment: 0 Active Problems Location of Pain Severity and Description of Pain Patient Has Paino Yes Site Locations Pain Location: Pain in Ulcers With Dressing Change: Yes Duration of the Pain. Constant / Intermittento Intermittent Pain Management and Medication Current Pain Management: Electronic Signature(s) Signed: 06/23/2016 2:38:29 PM By: Montey Hora Entered By: Montey Hora on 06/23/2016 09:12:15 Chase Clark ([id number]) -------------------------------------------------------------------------------- Patient/Caregiver Education Details Patient Name: Chase Clark Date of Service: 06/23/2016 9:00 AM Medical Record Number: [id number] Patient Account Number: [id number] Date of Birth/Gender: 1966/04/11 (51 y.o. Male) Treating RN: Montey Hora Primary Care Physician: [name], ERIC Other Clinician: Referring Physician: Samara Deist Treating Physician/Extender: Frann Rider in Treatment: 0 Education Assessment Education Provided To: Patient and Caregiver Education Topics Provided Wound/Skin Impairment: Handouts: Other: wound care as ordered Methods: Demonstration, Explain/Verbal Responses: State content correctly Electronic Signature(s) Signed: 06/23/2016 2:38:29 PM By: Montey Hora Entered By: Montey Hora on 06/23/2016 10:03:30 Chase Clark ([id number]) -------------------------------------------------------------------------------- Wound Assessment Details Patient Name: Chase Clark Date of Service: 06/23/2016 9:00 AM Medical Record Number: [id number] Patient Account Number: [id number] Date of Birth/Sex: [birth year] (51 y.o. 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 Wound Open Wounding Event: Gradually Appeared Status: Date Acquired: 04/25/2016 Comorbid Coronary Artery Disease, Weeks Of Treatment: 0 History: Hypertension, Type II Diabetes, Clustered Wound: No Osteoarthritis, Neuropathy Pending Amputation On Presentation Photos Photo Uploaded By: Montey Hora on 06/23/2016 10:01:20 Wound Measurements Length: (cm) 6.5 Width: (cm) 13.8 Depth: (cm) 0.2 Area: (cm) 70.45 Volume: (cm) 14.09 % Reduction in Area: % Reduction in Volume: Epithelialization: None Tunneling: No Undermining: No Wound Description Full Thickness Without Foul Odor Afte Classification: Exposed Support Structures Due to Product Diabetic Severity Slough/Fibrino Grade 1 (Wagner): Wound Margin: Flat and Intact Exudate Amount: Large Exudate Type: Serous Exudate Color: amber r Cleansing: Yes Use: No No Wound Bed Granulation Amount: None Present (0%) Exposed Structure Necrotic Amount: Large (67-100%) Fascia Exposed: No Necrotic Quality: Eschar Fat Layer (Subcutaneous Tissue) Exposed: No Chase Clark, Chase A. ([id number]) Tendon Exposed: No Muscle Exposed: No Joint Exposed: No Bone Exposed: No Limited to Skin Breakdown Periwound Skin Texture Texture Color No Abnormalities Noted: No No Abnormalities Noted: No Callus: No Atrophie Blanche: No Crepitus: No Cyanosis: No Excoriation: No Ecchymosis: No Induration: No Erythema: Yes Rash: No Erythema Location: Circumferential Scarring: No Hemosiderin Staining: No Mottled: No Moisture Pallor: No No Abnormalities Noted: No Rubor: No Dry / Scaly: No Maceration: No Temperature / Pain Temperature: No Abnormality Tenderness on Palpation: Yes Wound Preparation Ulcer Cleansing: Rinsed/Irrigated with Saline Topical Anesthetic  Applied: Other: lidocaine 4%, Treatment Notes Wound #1 (Left Calcaneus) 1. Cleansed with: Clean wound with Normal Saline 2. Anesthetic Topical Lidocaine 4% cream to wound bed prior to debridement 4. Dressing Applied: Santyl Ointment 5. Secondary Dressing Applied Guaze, ABD and kerlix/Conform 7. Secured with Recruitment consultant)  Signed: 06/23/2016 2:38:29 PM By: Montey Hora Entered By: Montey Hora on 06/23/2016 09:36:01 Chase Clark ([id number]) -------------------------------------------------------------------------------- [place] Details Patient Name: Chase Clark Date of Service: 06/23/2016 9:00 AM Medical Record Number: [id number] Patient Account Number: [id number] Date of Birth/Sex: 30-Jun-1965 (51 y.o. Male) Treating RN: Montey Hora Primary Care Provider: [name], ERIC Other Clinician: Referring Provider: Samara Deist Treating Provider/Extender: Frann Rider in Treatment: 0 Vital Signs Time Taken: 09:12 Temperature (F): 98.4 Height (in): 71 Pulse (bpm): 101 Source: Measured Respiratory Rate (breaths/min): 18 Weight (lbs): 195 Blood Pressure (mmHg): 105/82 Source: Measured Reference Range: 80 - 120 mg / dl Body Mass Index (BMI): 27.2 Electronic Signature(s) Signed: 06/23/2016 2:38:29 PM By: Montey Hora Entered By: Montey Hora on 06/23/2016 09:16:05

## 2016-06-23 NOTE — Progress Notes (Signed)
PLACIDO, HANGARTNER ([id number]) Visit Report for 06/23/2016 Chief Complaint Document Details Patient Name: Chase Clark, Chase Clark Date of Service: 06/23/2016 9:00 AM Medical Record Number: [id number] Patient Account Number: [id number] Date of Birth/Sex: [birth year] (51 y.o. Male) Treating RN: Cornell Barman Primary Care Provider: [name], ERIC Other Clinician: Referring Provider: Samara Deist Treating Provider/Extender: Frann Rider in Treatment: 0 Information Obtained from: Patient Chief Complaint Patient presents to the wound care center today with an open arterial ulcer mixed with diabetes mellitus which she's had for about 6 weeks Electronic Signature(s) Signed: 06/23/2016 10:58:16 AM By: Christin Fudge MD, FACS Entered By: Christin Fudge on 06/23/2016 10:58:15 Chase Clark ([id number]) -------------------------------------------------------------------------------- HPI Details Patient Name: Chase Clark Date of Service: 06/23/2016 9:00 AM Medical Record Number: [id number] Patient Account Number: [id number] Date of Birth/Sex: 19-Aug-1965 (51 y.o. Male) Treating RN: Cornell Barman Primary Care Provider: [name], ERIC Other Clinician: Referring Provider: Samara Deist Treating Provider/Extender: Frann Rider in Treatment: 0 History of Present Illness Location: left heel Quality: Patient reports experiencing a dull pain to affected area(s). Severity: Patient states wound are getting worse. Duration: Patient has had the wound for < 6 weeks prior to presenting for treatment Timing: Pain in wound is constant (hurts all the time) Context: The wound appeared gradually over time Modifying Factors: Other treatment(s) tried include:has had a vascular workup done and is also seeing the podiatrist Associated Signs and Symptoms: Patient reports having increase swelling. HPI Description: This 51 year old gentleman was recently seen by Dr. Delana Meyer of vascular surgery in follow-up  on 06/09/2016 to review his noninvasive studies and the pain in the left calf since his angiogram. He had a heel ulcer which is unchanged and had rest pain. The lower extremity arterial duplex examination done on 06/09/2016 showed a 50-74% stenosis of the left distal popliteal artery to the tibioperoneal trunk with a occluded left posterior tibial artery, proximal and middle segment with reversed biphasic flow distally and a patent left peroneal and anterior tibial artery with biphasic flow and elevated velocities of the peroneal artery. His past medical history significant for coronary artery disease, diabetes mellitus without complications, edema of the lower extremity, hypertension, neuropathy and also of the heel. He had an angiogram done on 05/20/2016,and recommended no further invasive studies. During the angiogram it was found that the left SFA and popliteal arteries were widely patent, but the posterior tibial was occluded approximately 3 cm from its origin. Attempts to cross the posterior tibial was unsuccessful. He did recommend noninvasive studies as noted above. He also recommended graduated compression stockings of the 10-15 minute rule variety for his mild edema His past surgical history is also significant for being status post cholecystectomy, coronary angioplasty and eyes surgery. he is a former smoker. Most recently the patient was seen by Dr. Samara Deist for the same problem and after review he recommended sending him to the wound center for evaluation and treatment and he would see him back in a month's time and decide at that stage whether he would debride the large necrotic tissue and apply a skin graft and possibly a wound VAC. Electronic Signature(s) Signed: 06/23/2016 11:17:31 AM By: Christin Fudge MD, FACS Previous Signature: 06/23/2016 10:16:52 AM Version By: Christin Fudge MD, FACS Previous Signature: 06/23/2016 9:54:01 AM Version By: Christin Fudge MD, FACS Previous  Signature: 06/23/2016 9:47:50 AM Version By: Christin Fudge MD, FACS Entered By: Christin Fudge on 06/23/2016 11:17:31 Chase Clark ([id number]) -------------------------------------------------------------------------------- Physical Exam Details Patient Name: Chase Clark Date of Service:  06/23/2016 9:00 AM Medical Record Number: [id number] Patient Account Number: [id number] Date of Birth/Sex: Sep 03, 1965 (51 y.o. Male) Treating RN: Cornell Barman Primary Care Provider: [name], ERIC Other Clinician: Referring Provider: Samara Deist Treating Provider/Extender: Frann Rider in Treatment: 0 Constitutional . Pulse regular. Respirations normal and unlabored. Afebrile. . Eyes Nonicteric. Reactive to light. Ears, Nose, Mouth, and Throat Lips, teeth, and gums WNL.Marland Kitchen Moist mucosa without lesions. Neck supple and nontender. No palpable supraclavicular or cervical adenopathy. Normal sized without goiter. Respiratory WNL. No retractions.. Cardiovascular Pedal Pulses WNL. recent arterial studies noted and the ABI in our office was noncompressible at 1.35. he has some pedal edema but no gross evidence of cellulitis. Chest Breasts symmetical and no nipple discharge.. Breast tissue WNL, no masses, lumps, or tenderness.. Gastrointestinal (GI) Abdomen without masses or tenderness.. No liver or spleen enlargement or tenderness.. Lymphatic No adneopathy. No adenopathy. No adenopathy. Musculoskeletal Adexa without tenderness or enlargement.. Digits and nails w/o clubbing, cyanosis, infection, petechiae, ischemia, or inflammatory conditions.. Integumentary (Hair, Skin) No suspicious lesions. No crepitus or fluctuance. No peri-wound warmth or erythema. No masses.Marland Kitchen Psychiatric Judgement and insight Intact.. No evidence of depression, anxiety, or agitation.. 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 This is turning into  wet gangrene and there is no gross evidence of an abscess. No sharp debridement was attempted today. Electronic Signature(s) Signed: 06/23/2016 11:18:47 AM By: Christin Fudge MD, FACS Entered By: Christin Fudge on 06/23/2016 11:18:46 Chase Clark ([id number]) Chase Clark ([id number]) -------------------------------------------------------------------------------- Physician Orders Details Patient Name: Chase Clark Date of Service: 06/23/2016 9:00 AM Medical Record Number: [id number] Patient Account Number: [id number] Date of Birth/Sex: 05-24-1965 (51 y.o. Male) Treating RN: Montey Hora Primary Care Provider: [name], ERIC Other Clinician: Referring Provider: Samara Deist Treating Provider/Extender: Frann Rider in Treatment: 0 Verbal / Phone Orders: No Diagnosis Coding Wound Cleansing Wound #1 Left Calcaneus o Clean wound with Normal Saline. o May Shower, gently pat wound dry prior to applying new dressing. Anesthetic Wound #1 Left Calcaneus o Topical Lidocaine 4% cream applied to wound bed prior to debridement Primary Wound Dressing Wound #1 Left Calcaneus o Santyl Ointment Secondary Dressing Wound #1 Left Calcaneus o Gauze, ABD and Kerlix/Conform Dressing Change Frequency Wound #1 Left Calcaneus o Change dressing every day. Follow-up Appointments Wound #1 Left Calcaneus o Return Appointment in 1 week. Edema Control Wound #1 Left Calcaneus o Elevate legs to the level of the heart and pump ankles as often as possible Off-Loading Wound #1 Left Calcaneus o Other: - continue with knee scooter Additional Orders / Instructions CUTBERTO, WINFREE ([id number]) Wound #1 Left Calcaneus o Increase protein intake. o Other: - please add vitamin a, vitamin c and zinc supplements to your diet Home Health Wound #1 Left [place] Nurse may visit PRN to address patientos wound care  needs. o FACE TO FACE ENCOUNTER: MEDICARE and MEDICAID PATIENTS: I certify that this patient is under my care and that I had a face-to-face encounter that meets the physician face-to-face encounter requirements with this patient on this date. The encounter with the patient was in whole or in part for the following MEDICAL CONDITION: (primary reason for [place]) MEDICAL NECESSITY: I certify, that based on my findings, NURSING services are a medically necessary home health service. HOME BOUND STATUS: I certify that my clinical findings support that this patient is homebound (i.e., Due to illness  or injury, pt requires aid of supportive devices such as crutches, cane, wheelchairs, walkers, the use of special transportation or the assistance of another person to leave their place of residence. There is a normal inability to leave the home and doing so requires considerable and taxing effort. Other absences are for medical reasons / religious services and are infrequent or of short duration when for other reasons). o If current dressing causes regression in wound condition, may D/C ordered dressing product/s and apply Normal Saline Moist Dressing daily until next [place] / Other MD appointment. [place] of regression in wound condition at [phone]. o Please direct any NON-WOUND related issues/requests for orders to patient's Primary Care Physician Medications-please add to medication list. Wound #1 Left Calcaneus o Santyl Enzymatic Ointment Notes Patient to see Dr Vickki Muff asap for surgical debridement Electronic Signature(s) Signed: 06/23/2016 2:21:07 PM By: Christin Fudge MD, FACS Signed: 06/23/2016 2:38:29 PM By: Montey Hora Entered By: Montey Hora on 06/23/2016 10:10:42 Chase Clark ([id number]) -------------------------------------------------------------------------------- Problem List Details Patient Name: Chase Clark Date of Service: 06/23/2016 9:00 AM Medical Record Number: [id number] Patient Account Number: [id number] Date of Birth/Sex: Apr 26, 1965 (51 y.o. 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 Male) Treating RN: Cornell Barman Primary Care Provider: [name], ERIC Other Clinician: Referring Provider: Samara Deist Treating Provider/Extender: Frann Rider in Treatment: 0 Subjective Chief Complaint Information obtained from Patient Patient presents to the wound care center today with an open arterial ulcer mixed with diabetes mellitus which she's had for about 6 weeks History of Present Illness (HPI) The following HPI elements were documented for the patient's wound: Location: left heel Quality: Patient reports experiencing a dull pain to affected area(s). Severity: Patient states wound are getting worse. Duration: Patient has had the wound  for < 6 weeks prior to presenting for treatment Timing: Pain in wound is constant (hurts all the time) Context: The wound appeared gradually over time Modifying Factors: Other treatment(s) tried include:has had a vascular workup done and is also seeing the podiatrist Associated Signs and Symptoms: Patient reports having increase swelling. This 51 year old gentleman was recently seen by Dr. Delana Meyer of vascular surgery in follow-up on 06/09/2016 to review his noninvasive studies and the pain in the left calf since his angiogram. He had a heel ulcer which is unchanged and had rest pain. The lower extremity arterial duplex examination done on 06/09/2016 showed a 50-74% stenosis of the left distal popliteal artery to the tibioperoneal trunk with a occluded left posterior tibial artery, proximal and middle segment with reversed biphasic flow distally and a patent left peroneal and anterior tibial artery with biphasic flow and elevated velocities of the peroneal artery. His past medical  history significant for coronary artery disease, diabetes mellitus without complications, edema of the lower extremity, hypertension, neuropathy and also of the heel. He had an angiogram done on 05/20/2016,and recommended no further invasive studies. During the angiogram it was found that the left SFA and popliteal arteries were widely patent, but the posterior tibial was occluded approximately 3 cm from its origin. Attempts to cross the posterior tibial was unsuccessful. He did recommend noninvasive studies as noted above. He also recommended graduated compression stockings of the 10-15 minute rule variety for his mild edema His past surgical history is also significant for being status post cholecystectomy, coronary angioplasty and eyes surgery. he is a former smoker. Most recently the patient was seen by Dr. Samara Deist for the same problem and after review he recommended sending him to the wound center for  evaluation and treatment and he would see him back in a month's time and decide at that stage whether he would debride the large necrotic tissue and apply a skin graft and possibly a wound VAC. TRAVELL, DESAULNIERS ([id number]) Wound History Patient presents with 1 open wound that has been present for approximately 6 weeks. Patient has been treating wound in the following manner: santyl and bandage. Laboratory tests have been performed in the last month. Patient reportedly has not tested positive for an antibiotic resistant organism. Patient reportedly has not tested positive for osteomyelitis. Patient reportedly has had testing performed to evaluate circulation in the legs. Patient History Information obtained from Patient. Allergies Statins-Hmg-Coa Reductase Inhibitors (Reaction: muscle pain) Social History Former smoker - 8 years ago, Marital Status - Married, Alcohol Use - Rarely, Drug Use - No History, Caffeine Use - Daily. Medical History Eyes Denies history of Cataracts, Glaucoma, Optic Neuritis Ear/Nose/Mouth/Throat Denies history of Chronic sinus problems/congestion, Middle ear problems Hematologic/Lymphatic Denies history of Anemia, Hemophilia, Human Immunodeficiency Virus, Lymphedema, Sickle Cell Disease Respiratory Denies history of Aspiration, Asthma, Chronic Obstructive Pulmonary Disease (COPD), Pneumothorax, Sleep Apnea, Tuberculosis Cardiovascular Patient has history of Coronary Artery Disease, Hypertension Denies history of Angina, Arrhythmia, Congestive Heart Failure, Deep Vein Thrombosis, Hypotension, Myocardial Infarction, Peripheral Arterial Disease, Peripheral Venous Disease, Phlebitis, Vasculitis Gastrointestinal Denies history of Cirrhosis , Colitis, Crohn s, Hepatitis A, Hepatitis B, Hepatitis C Endocrine Patient has history of Type II Diabetes Immunological Denies history of Lupus Erythematosus, Raynaud s, Scleroderma Integumentary (Skin) Denies history of  History of Burn, History of pressure wounds Musculoskeletal Patient has history of Osteoarthritis Denies history of Gout, Rheumatoid Arthritis, Osteomyelitis Neurologic Patient has history of Neuropathy Denies history of Dementia, Quadriplegia, Paraplegia, Seizure Disorder Oncologic Denies history of Received Chemotherapy, Received Radiation Patient is treated with Insulin. Blood sugar is tested. 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 Eyes The patient has no complaints or symptoms. Ear/Nose/Mouth/Throat The patient has no complaints or symptoms. Hematologic/Lymphatic The patient has no complaints or symptoms. Genitourinary The patient has no complaints or symptoms. Immunological The patient has no complaints or symptoms. Integumentary (Skin) The patient has no complaints or symptoms. Musculoskeletal The patient has no complaints or symptoms. Neurologic The patient has no complaints or symptoms. Oncologic The patient has no complaints or symptoms. Psychiatric The patient has no complaints or symptoms. Medications Hyzaar 100 mg-25 mg tablet oral 1 1 tablet oral daily gabapentin 300 mg capsule oral 2 2 capsules oral (600 mg.) two times daily diphenoxylate-atropine 2.5 mg-0.025 mg tablet oral 1  1 tablet oral four times daily as needed metformin 1,000 mg tablet oral tablet oral lisinopril 40 mg tablet oral 1 1 tablet oral daily glucosamine-chondroitin 500 mg-400 mg tablet oral 1 1 tablet oral daily Artificial Tears (PF) drops in a dropperette ophthalmic (eye) dropperette ophthalmic (eye) Cholestyramine Light 4 gram powder for susp in a packet oral 1 1 powder in packet oral daily Durezol 0.05 % eye drops ophthalmic (eye) 1 1 drops ophthalmic (eye) to left eye nightly Lantus U-100  Insulin 100 unit/mL subcutaneous solution subcutaneous solution subcutaneous 30 units daily Novolog Flexpen U-100 Insulin aspart 100 unit/mL subcutaneous subcutaneous insulin pen subcutaneous five units three times daily with meals Combigan 0.2 %-0.5 % eye drops ophthalmic (eye) 1 1 drop ophthalmic (eye) into both eyes two times daily latanoprost 0.005 % eye drops ophthalmic (eye) 1 1 drop ophthalmic (eye) into both eyes at bedtime JASANI, DOLNEY A. ([id number]) multivitamin tablet oral 1 1 tablet oral daily hydrocodone 5 mg-acetaminophen 325 mg tablet oral 1 1 tablet oral every six hours as needed aspirin 81 mg tablet,delayed release oral 1 1 tablet,delayed release (DR/EC) oral daily clopidogrel 75 mg tablet oral 1 1 tablet oral daily Nexium 40 mg capsule,delayed release oral 1 1 capsule,delayed release(DR/EC) oral daily Vibramycin 100 mg capsule oral 1 1 capsule oral every twelve hours Santyl 250 unit/gram topical ointment topical ointment topical Objective Constitutional Pulse regular. Respirations normal and unlabored. Afebrile. Vitals Time Taken: 9:12 AM, Height: 71 in, Source: Measured, Weight: 195 lbs, Source: Measured, BMI: 27.2, Temperature: 98.4 F, Pulse: 101 bpm, Respiratory Rate: 18 breaths/min, Blood Pressure: 105/82 mmHg. Eyes Nonicteric. Reactive to light. Ears, Nose, Mouth, and Throat Lips, teeth, and gums WNL.Marland Kitchen Moist mucosa without lesions. Neck supple and nontender. No palpable supraclavicular or cervical adenopathy. Normal sized without goiter. Respiratory WNL. No retractions.. Cardiovascular Pedal Pulses WNL. recent arterial studies noted and the ABI in our office was noncompressible at 1.35. he has some pedal edema but no gross evidence of cellulitis. Chest Breasts symmetical and no nipple discharge.. Breast tissue WNL, no masses, lumps, or tenderness.. Gastrointestinal (GI) Abdomen without masses or tenderness.. No liver or spleen enlargement or  tenderness.. Lymphatic No adneopathy. No adenopathy. No adenopathy. Musculoskeletal Adexa without tenderness or enlargement.. Digits and nails w/o clubbing, cyanosis, infection, petechiae, ischemia, or inflammatory conditions.Marland Kitchen PATRIK, TURNBAUGH ([id number]) Psychiatric Judgement and insight Intact.. No evidence of depression, anxiety, or agitation.. 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 This is turning into wet gangrene and there is no gross evidence of an abscess. No sharp debridement was attempted today. Integumentary (Hair, Skin) No suspicious lesions. No crepitus or fluctuance. No peri-wound warmth or erythema. No masses.. Wound #1 status is Open. Original cause of wound was Gradually Appeared. The wound is located on the Left Calcaneus. The wound measures 6.5cm length x 13.8cm width x 0.2cm depth; 70.45cm^2 area and 14.09cm^3 volume. The wound is limited to skin breakdown. There is no tunneling or undermining noted. There is a large amount of serous drainage noted. The wound margin is flat and intact. There is no granulation within the wound bed. There is a large (67-100%) amount of necrotic tissue within the wound bed including Eschar. The periwound skin appearance exhibited: Erythema. The periwound skin appearance did not exhibit: Callus, Crepitus, Excoriation, Induration, Rash, Scarring, Dry/Scaly, Maceration, Atrophie Blanche, Cyanosis, Ecchymosis, Hemosiderin Staining, Mottled, Pallor, Rubor. The surrounding wound skin color is noted with erythema which is  circumferential. Periwound temperature was noted as No Abnormality. The periwound has tenderness on palpation. 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 At this stage I am afraid of this turning into a wet gangrene with cellulitis and proximal infection and hence I have recommended: 1. See Dr. Vickki Muff as soon as possible for consideration of debridement with possible placement of a wound VAC. JUPITER, KABIR ([id number]) 2. continue with conservative therapy with Santyl ointment and dry dressing for now 3. Good control of his diabetes mellitus and see an endocrinologist as soon as possible 4. Adequate protein, vitamin A, vitamin C and zinc the patient and his wife have read all questions answered and I will personally try and get in touch with Dr. Vickki Muff as soon as possible to discuss the management. Plan Wound Cleansing: Wound #1 Left Calcaneus: Clean wound with Normal Saline. May Shower, gently pat wound dry prior to applying new dressing. Anesthetic: Wound #1 Left Calcaneus: Topical Lidocaine 4% cream applied to wound bed prior to debridement Primary Wound Dressing: Wound #1 Left Calcaneus: Santyl Ointment Secondary Dressing: Wound #1 Left Calcaneus: Gauze, ABD and Kerlix/Conform Dressing Change Frequency: Wound #1 Left Calcaneus: Change dressing every day. Follow-up Appointments: Wound #1 Left Calcaneus: Return Appointment in 1 week. Edema Control: Wound #1 Left Calcaneus: Elevate legs to the level of the heart and pump ankles as often as possible Off-Loading: Wound #1 Left Calcaneus: Other: - continue with knee scooter Additional Orders / Instructions: Wound #1 Left Calcaneus: Increase protein intake. Other: - please add vitamin a, vitamin c and zinc supplements to your  diet Home Health: Wound #1 Left Calcaneus: [place] Nurse may visit PRN to address patient s wound care needs. FACE TO FACE ENCOUNTER: MEDICARE and MEDICAID PATIENTS: I certify that this patient is under my care and that I had a face-to-face encounter that meets the physician face-to-face encounter requirements with this patient on this date. The encounter with the patient was in whole or in part for the following MEDICAL CONDITION: (primary reason for [place]) MEDICAL NECESSITY: I certify, that based on my findings, NURSING services are a medically necessary home health service. HOME BOUND STATUS: I certify that my clinical findings support that this patient is homebound (i.e., Due to illness or injury, pt requires aid of supportive devices such as crutches, cane, wheelchairs, walkers, the use WADDELL, ITEN A. ([id number]) of special transportation or the assistance of another person to leave their place of residence. There is a normal inability to leave the home and doing so requires considerable and taxing effort. Other absences are for medical reasons / religious services and are infrequent or of short duration when for other reasons). If current dressing causes regression in wound condition, may D/C ordered dressing product/s and apply Normal Saline Moist Dressing daily until next [place] / Other MD appointment. [place] of regression in wound condition at [phone]. Please direct any NON-WOUND related issues/requests for orders to patient's Primary Care Physician Medications-please add to medication  list.: Wound #1 Left Calcaneus: Santyl Enzymatic Ointment General Notes: Patient to see Dr Vickki Muff asap for surgical debridement this 51 year old gentleman with uncontrolled diabetes mellitus with hyperglycemia had a last hemoglobin A1c of approximately 9.5%. 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 At this stage I am afraid of this turning into a wet gangrene with cellulitis and proximal infection and hence I have recommended: 1. See Dr. Vickki Muff as soon as possible for consideration of debridement with possible placement of a wound VAC. 2. continue with conservative therapy with Santyl ointment and dry dressing for now 3. Good control of his diabetes mellitus and see an endocrinologist as soon as possible 4. Adequate protein, vitamin A, vitamin C and zinc the patient and his wife have read all questions answered and I will personally try and get in touch with Dr. Vickki Muff as soon as possible to discuss the management. Electronic Signature(s) Signed: 06/23/2016 11:23:50 AM By: Christin Fudge MD, FACS Entered By: Christin Fudge on 06/23/2016 11:23:50 Chase Clark ([id number]) -------------------------------------------------------------------------------- ROS/PFSH Details Patient Name: Chase Clark Date of Service: 06/23/2016 9:00 AM Medical Record Number: [id number] Patient Account Number: [id number] Date of Birth/Sex: 1966/02/02 (51 y.o. Male) Treating RN: Montey Hora Primary Care Provider: [name], ERIC Other Clinician: Referring Provider: Samara Deist Treating Provider/Extender: Frann Rider in Treatment: 0 Information Obtained From Patient Wound History Do you currently have one or more open woundso Yes How many open wounds do you currently haveo 1 Approximately how long have you had your woundso 6 weeks How have you been treating your wound(s) until nowo santyl and bandage Has your wound(s) ever healed and then re-openedo No Have you had any lab work done in the past montho Yes Who ordered the lab work doneo [facility] Have you tested positive for an antibiotic resistant organism (MRSA, VRE)o No Have you tested positive for osteomyelitis (bone infection)o No Have you had any tests for circulation on your legso Yes Who  ordered the testo Dr Delana Meyer Where was the test doneo [facility] Constitutional Symptoms (General Health) Complaints and Symptoms: No Complaints or Symptoms Eyes Complaints and Symptoms: No Complaints or Symptoms Medical History: Negative for: Cataracts; Glaucoma; Optic Neuritis Ear/Nose/Mouth/Throat Complaints and Symptoms: No Complaints or Symptoms Medical History: Negative for: Chronic sinus problems/congestion; Middle ear problems Hematologic/Lymphatic Complaints and Symptoms: No Complaints or Symptoms HARUTYUN, MONTEVERDE. ([id number]) Medical History: Negative for: Anemia; Hemophilia; Human Immunodeficiency Virus; Lymphedema; Sickle Cell Disease Respiratory Medical History: Negative for: Aspiration; Asthma; Chronic Obstructive Pulmonary Disease (COPD); Pneumothorax; Sleep Apnea; Tuberculosis Cardiovascular Medical History: Positive for: Coronary Artery Disease; Hypertension Negative for: Angina; Arrhythmia; Congestive Heart Failure; Deep Vein Thrombosis; Hypotension; Myocardial Infarction; Peripheral Arterial Disease; Peripheral Venous Disease; Phlebitis; Vasculitis Past Medical History Notes: dyslipidemia Gastrointestinal Medical History: Negative for: Cirrhosis ; Colitis; Crohnos; Hepatitis A; Hepatitis B; Hepatitis C Past Medical History Notes: gilberts syndrome Endocrine Medical History: Positive for: Type II Diabetes Treated with: Insulin Blood sugar tested every day: Yes Tested : 3-4 times daily Genitourinary Complaints and Symptoms: No Complaints or Symptoms Immunological Complaints and Symptoms: No Complaints or Symptoms Medical History: Negative for: Lupus Erythematosus; Raynaudos; Scleroderma Integumentary (Skin) Complaints and Symptoms: No Complaints or Symptoms Medical History: TANAY, MISURACA ([id number]) Negative for: History of Burn; History of pressure wounds Musculoskeletal Complaints and Symptoms: No Complaints or Symptoms Medical  History: Positive for: Osteoarthritis Negative for: Gout; Rheumatoid Arthritis; Osteomyelitis Neurologic Complaints and Symptoms: No Complaints or Symptoms Medical History: Positive for:  Neuropathy Negative for: Dementia; Quadriplegia; Paraplegia; Seizure Disorder Oncologic Complaints and Symptoms: No Complaints or Symptoms Medical History: Negative for: Received Chemotherapy; Received Radiation Psychiatric Complaints and Symptoms: No Complaints or Symptoms Immunizations Pneumococcal Vaccine: Received Pneumococcal Vaccination: Yes Immunization Notes: up to date Family and Social History Former smoker - 8 years ago; Marital Status - Married; Alcohol Use: Rarely; Drug Use: No History; Caffeine Use: Daily; Financial Concerns: No; Food, Clothing or Shelter Needs: No; Support System Lacking: No; Transportation Concerns: No; Advanced Directives: Yes (Not Provided); Patient does not want information on Advanced Directives; Living Will: Yes (Not Provided) Physician Affirmation I have reviewed and agree with the above information. Electronic Signature(s) Signed: 06/23/2016 2:21:07 PM By: Christin Fudge MD, FACS Chase Clark ([id number]) Signed: 06/23/2016 2:38:29 PM By: Montey Hora Entered By: Christin Fudge on 06/23/2016 09:34:24 Chase Clark ([id number]) -------------------------------------------------------------------------------- [place] Details Patient Name: Chase Clark Date of Service: 06/23/2016 Medical Record Number: [id number] Patient Account Number: [id number] Date of Birth/Sex: [birth year] (51 y.o. 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 Facility Procedures CPT4 Code: 76195093 Description: 99213 - WOUND CARE VISIT-LEV 3 EST PT Modifier: Quantity: 1 Physician Procedures CPT4: Description Modifier Quantity Code [id number] - WC PHYS LEVEL 4 - NEW PT 1 ICD-10 Description Diagnosis E11.621 Type 2 diabetes mellitus with foot ulcer E11.52 Type 2 diabetes mellitus with diabetic peripheral angiopathy with gangrene I70.244  Atherosclerosis of native arteries of left leg with ulceration of heel and midfoot 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

## 2016-06-25 DIAGNOSIS — L97423 Non-pressure chronic ulcer of left heel and midfoot with necrosis of muscle: Secondary | ICD-10-CM | POA: Diagnosis not present

## 2016-06-28 DIAGNOSIS — I10 Essential (primary) hypertension: Secondary | ICD-10-CM | POA: Diagnosis not present

## 2016-06-28 DIAGNOSIS — L97429 Non-pressure chronic ulcer of left heel and midfoot with unspecified severity: Secondary | ICD-10-CM | POA: Diagnosis not present

## 2016-06-28 DIAGNOSIS — E11621 Type 2 diabetes mellitus with foot ulcer: Secondary | ICD-10-CM | POA: Diagnosis not present

## 2016-06-30 ENCOUNTER — Encounter: Payer: 59 | Admitting: Surgery

## 2016-06-30 ENCOUNTER — Other Ambulatory Visit
Admission: RE | Admit: 2016-06-30 | Discharge: 2016-06-30 | Disposition: A | Discharge status: Home / Self Care | Payer: 59 | Source: Ambulatory Visit | Attending: Surgery | Admitting: Surgery

## 2016-06-30 DIAGNOSIS — L97429 Non-pressure chronic ulcer of left heel and midfoot with unspecified severity: Secondary | ICD-10-CM | POA: Diagnosis not present

## 2016-06-30 DIAGNOSIS — E11621 Type 2 diabetes mellitus with foot ulcer: Secondary | ICD-10-CM | POA: Diagnosis not present

## 2016-06-30 DIAGNOSIS — E1165 Type 2 diabetes mellitus with hyperglycemia: Secondary | ICD-10-CM | POA: Diagnosis not present

## 2016-06-30 DIAGNOSIS — I70244 Atherosclerosis of native arteries of left leg with ulceration of heel and midfoot: Secondary | ICD-10-CM | POA: Diagnosis not present

## 2016-06-30 NOTE — Progress Notes (Addendum)
Chase, Clark ([id number]) Visit Report for 06/30/2016 Chief Complaint Document Details Patient Name: Chase, Clark Date of Service: 06/30/2016 10:30 AM Medical Record Number: [id number] Patient Account Number: [id number] Date of Birth/Sex: Mar 04, 1966 (50 y.o. Male) Treating RN: Cornell Barman Primary Care Provider: [name], ERIC Other Clinician: Referring Provider: [name], ERIC Treating Provider/Extender: Frann Rider in Treatment: 1 Information Obtained from: Patient Chief Complaint Patient presents to the wound care center today with an open arterial ulcer mixed with diabetes mellitus which she's had for about 6 weeks Electronic Signature(s) Signed: 06/30/2016 11:25:46 AM By: Christin Fudge MD, FACS Entered By: Christin Fudge on 06/30/2016 11:25:46 Chase Clark ([id number]) -------------------------------------------------------------------------------- HPI Details Patient Name: Chase Clark Date of Service: 06/30/2016 10:30 AM Medical Record Number: [id number] Patient Account Number: [id number] Date of Birth/Sex: June 03, 1965 (51 y.o. Male) Treating RN: Cornell Barman Primary Care Provider: [name], ERIC Other Clinician: Referring Provider: [name], ERIC Treating Provider/Extender: Frann Rider in Treatment: 1 History of Present Illness Location: left heel Quality: Patient reports experiencing a dull pain to affected area(s). Severity: Patient states wound are getting worse. Duration: Patient has had the wound for < 6 weeks prior to presenting for treatment Timing: Pain in wound is constant (hurts all the time) Context: The wound appeared gradually over time Modifying Factors: Other treatment(s) tried include:has had a vascular workup done and is also seeing the podiatrist Associated Signs and Symptoms: Patient reports having increase swelling. HPI Description: This 51 year old gentleman was recently seen by Dr. Delana Meyer of vascular surgery  in follow-up on 06/09/2016 to review his noninvasive studies and the pain in the left calf since his angiogram. He had a heel ulcer which is unchanged and had rest pain. The lower extremity arterial duplex examination done on 06/09/2016 showed a 50-74% stenosis of the left distal popliteal artery to the tibioperoneal trunk with a occluded left posterior tibial artery, proximal and middle segment with reversed biphasic flow distally and a patent left peroneal and anterior tibial artery with biphasic flow and elevated velocities of the peroneal artery. His past medical history significant for coronary artery disease, diabetes mellitus without complications, edema of the lower extremity, hypertension, neuropathy and also of the heel. He had an angiogram done on 05/20/2016,and recommended no further invasive studies. During the angiogram it was found that the left SFA and popliteal arteries were widely patent, but the posterior tibial was occluded approximately 3 cm from its origin. Attempts to cross the posterior tibial was unsuccessful. He did recommend noninvasive studies as noted above. He also recommended graduated compression stockings of the 10-15 minute rule variety for his mild edema His past surgical history is also significant for being status post cholecystectomy, coronary angioplasty and eyes surgery. he is a former smoker. Most recently the patient was seen by Dr. Samara Deist for the same problem and after review he recommended sending him to the wound center for evaluation and treatment and he would see him back in a month's time and decide at that stage whether he would debride the large necrotic tissue and apply a skin graft and possibly a wound VAC. 06/30/2016 -- last week I spoke to Dr. Vickki Muff on 06/24/2016 and as he was going to be out of town he had the patient seen by his colleague Dr. Thresa Ross on 06/25/2016. The ulcer was debrided in the office and the entire scar was  removed. Anasept wet-to-dry dressings were recommended and refill for his Augmentin was given and he was asked to follow-up with Dr.  Fowler later in the week. Electronic Signature(s) Signed: 06/30/2016 11:26:28 AM By: Christin Fudge MD, FACS Previous Signature: 06/30/2016 10:39:39 AM Version By: Christin Fudge MD, FACS Previous Signature: 06/30/2016 10:38:16 AM Version By: Christin Fudge MD, FACS Chase Clark ([id number]) Entered By: Christin Fudge on 06/30/2016 11:26:28 Chase Clark ([id number]) -------------------------------------------------------------------------------- Physical Exam Details Patient Name: Chase Clark Date of Service: 06/30/2016 10:30 AM Medical Record Number: [id number] Patient Account Number: [id number] Date of Birth/Sex: 09/07/1965 (51 y.o. Male) Treating RN: Cornell Barman Primary Care Provider: [name], ERIC Other Clinician: Referring Provider: [name], ERIC Treating Provider/Extender: Frann Rider in Treatment: 1 Constitutional . Pulse regular. Respirations normal and unlabored. Afebrile. . Eyes Nonicteric. Reactive to light. Ears, Nose, Mouth, and Throat Lips, teeth, and gums WNL.Marland Kitchen Moist mucosa without lesions. Neck supple and nontender. No palpable supraclavicular or cervical adenopathy. Normal sized without goiter. Respiratory WNL. No retractions.. Cardiovascular Pedal Pulses WNL. No clubbing, cyanosis or edema. Lymphatic No adneopathy. No adenopathy. No adenopathy. Musculoskeletal Adexa without tenderness or enlargement.. Digits and nails w/o clubbing, cyanosis, infection, petechiae, ischemia, or inflammatory conditions.. Integumentary (Hair, Skin) No suspicious lesions. No crepitus or fluctuance. No peri-wound warmth or erythema. No masses.Marland Kitchen Psychiatric Judgement and insight Intact.. No evidence of depression, anxiety, or agitation.. Notes the patient continues to have boggy areas over his left heel and the lateral edges have an  element of purulence and I have taken cultures today. There is no evidence of florid abscess or proximal cellulitis. This is a Personal assistant V diabetic foot ulcer. Electronic Signature(s) Signed: 06/30/2016 11:27:31 AM By: Christin Fudge MD, FACS Entered By: Christin Fudge on 06/30/2016 11:27:31 Chase Clark ([id number]) -------------------------------------------------------------------------------- Physician Orders Details Patient Name: Chase Clark Date of Service: 06/30/2016 10:30 AM Medical Record Number: [id number] Patient Account Number: [id number] Date of Birth/Sex: 03-17-1966 (51 y.o. Male) Treating RN: Cornell Barman Primary Care Provider: [name], ERIC Other Clinician: Referring Provider: [name], ERIC Treating Provider/Extender: Frann Rider in Treatment: 1 Verbal / Phone Orders: No Diagnosis Coding Wound Cleansing Wound #1 Left Calcaneus o Clean wound with Normal Saline. o May Shower, gently pat wound dry prior to applying new dressing. Anesthetic Wound #1 Left Calcaneus o Topical Lidocaine 4% cream applied to wound bed prior to debridement Primary Wound Dressing Wound #1 Left Calcaneus o Santyl Ointment Secondary Dressing Wound #1 Left Calcaneus o Gauze, ABD and Kerlix/Conform Dressing Change Frequency Wound #1 Left Calcaneus o Change dressing every day. Follow-up Appointments Wound #1 Left Calcaneus o Return Appointment in 1 week. Edema Control Wound #1 Left Calcaneus o Elevate legs to the level of the heart and pump ankles as often as possible Off-Loading Wound #1 Left Calcaneus o Other: - continue with knee scooter Additional Orders / Instructions Chase, Clark ([id number]) Wound #1 Left Calcaneus o Increase protein intake. o Other: - please add vitamin a, vitamin c and zinc supplements to your diet Home Health Wound #1 Left Calcaneus o Continue Home Health Visits - [place] Nurse may visit  PRN to address patientos wound care needs. o FACE TO FACE ENCOUNTER: MEDICARE and MEDICAID PATIENTS: I certify that this patient is under my care and that I had a face-to-face encounter that meets the physician face-to-face encounter requirements with this patient on this date. The encounter with the patient was in whole or in part for the following MEDICAL CONDITION: (primary reason for [place]) MEDICAL NECESSITY: I certify, that based on my findings, NURSING services are a medically necessary home health service.  HOME BOUND STATUS: I certify that my clinical findings support that this patient is homebound (i.e., Due to illness or injury, pt requires aid of supportive devices such as crutches, cane, wheelchairs, walkers, the use of special transportation or the assistance of another person to leave their place of residence. There is a normal inability to leave the home and doing so requires considerable and taxing effort. Other absences are for medical reasons / religious services and are infrequent or of short duration when for other reasons). o If current dressing causes regression in wound condition, may D/C ordered dressing product/s and apply Normal Saline Moist Dressing daily until next [place] / Other MD appointment. [place] of regression in wound condition at [phone]. o Please direct any NON-WOUND related issues/requests for orders to patient's Primary Care Physician Medications-please add to medication list. Wound #1 Left Calcaneus o Santyl Enzymatic Ointment Laboratory o Bacteria identified in Wound by Culture (MICRO) - Left heel oooo LOINC Code: 9675-9 oooo Convenience Name: Wound culture routine Patient Medications Allergies: Statins-Hmg-Coa Reductase Inhibitors Notifications Medication Indication Start End Bactrim DS 07/03/2016 DOSE oral 800 mg-160 mg tablet - tablet oral bid Electronic Signature(s) Chase, Clark  ([id number]) Signed: 07/03/2016 9:20:32 AM By: Christin Fudge MD, FACS Previous Signature: 06/30/2016 4:53:16 PM Version By: Christin Fudge MD, FACS Previous Signature: 06/30/2016 5:09:44 PM Version By: Gretta Cool RN, BSN, Kim RN, BSN Entered By: Christin Fudge on 07/03/2016 09:20:32 Chase Clark ([id number]) -------------------------------------------------------------------------------- Problem List Details Patient Name: Chase Clark Date of Service: 06/30/2016 10:30 AM Medical Record Number: [id number] Patient Account Number: [id number] Date of Birth/Sex: Sep 02, 1965 (50 y.o. 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 Male) Treating RN: Cornell Barman Primary Care Provider: [name], ERIC Other Clinician: Referring Provider: [name], ERIC Treating Provider/Extender: Frann Rider in Treatment: 1 Subjective Chief  Complaint Information obtained from Patient Patient presents to the wound care center today with an open arterial ulcer mixed with diabetes mellitus which she's had for about 6 weeks History of Present Illness (HPI) The following HPI elements were documented for the patient's wound: Location: left heel Quality: Patient reports experiencing a dull pain to affected area(s). Severity: Patient states wound are getting worse. Duration: Patient has had the wound for < 6 weeks prior to presenting for treatment Timing: Pain in wound is constant (hurts all the time) Context: The wound appeared gradually over time Modifying Factors: Other treatment(s) tried include:has had a vascular workup done and is also seeing the podiatrist Associated Signs and Symptoms: Patient reports having increase swelling. This 51 year old gentleman was recently seen by Dr. Delana Meyer of vascular surgery in follow-up on 06/09/2016 to review his noninvasive studies and the pain in the left calf  since his angiogram. He had a heel ulcer which is unchanged and had rest pain. The lower extremity arterial duplex examination done on 06/09/2016 showed a 50-74% stenosis of the left distal popliteal artery to the tibioperoneal trunk with a occluded left posterior tibial artery, proximal and middle segment with reversed biphasic flow distally and a patent left peroneal and anterior tibial artery with biphasic flow and elevated velocities of the peroneal artery. His past medical history significant for coronary artery disease, diabetes mellitus without complications, edema of the lower extremity, hypertension, neuropathy and also of the heel. He had an angiogram done on 05/20/2016,and recommended no further invasive studies. During the angiogram it was found that the left SFA and popliteal arteries were widely patent, but the posterior tibial was occluded approximately 3 cm from its origin. Attempts to cross the posterior tibial was  unsuccessful. He did recommend noninvasive studies as noted above. He also recommended graduated compression stockings of the 10-15 minute rule variety for his mild edema His past surgical history is also significant for being status post cholecystectomy, coronary angioplasty and eyes surgery. he is a former smoker. Most recently the patient was seen by Dr. Samara Deist for the same problem and after review he recommended sending him to the wound center for evaluation and treatment and he would see him back in a month's time and decide at that stage whether he would debride the large necrotic tissue and apply a skin graft and possibly a wound VAC. Chase, Clark ([id number]) 06/30/2016 -- last week I spoke to Dr. Vickki Muff on 06/24/2016 and as he was going to be out of town he had the patient seen by his colleague Dr. Thresa Ross on 06/25/2016. The ulcer was debrided in the office and the entire scar was removed. Anasept wet-to-dry dressings were recommended and refill for his Augmentin was given and he was asked to follow-up with Dr. Vickki Muff later in the week. Objective Constitutional Pulse regular. Respirations normal and unlabored. Afebrile. Vitals Time Taken: 10:33 AM, Height: 71 in, Weight: 195 lbs, BMI: 27.2, Temperature: 97.6 F, Pulse: 105 bpm, Respiratory Rate: 18 breaths/min, Blood Pressure: 157/95 mmHg. Eyes Nonicteric. Reactive to light. Ears, Nose, Mouth, and Throat Lips, teeth, and gums WNL.Marland Kitchen Moist mucosa without lesions. Neck supple and nontender. No palpable supraclavicular or cervical adenopathy. Normal sized without goiter. Respiratory WNL. No retractions.. Cardiovascular Pedal Pulses WNL. No clubbing, cyanosis or edema. Lymphatic No adneopathy. No adenopathy. No adenopathy. Musculoskeletal Adexa without tenderness or enlargement.. Digits and nails w/o clubbing, cyanosis, infection, petechiae, ischemia, or inflammatory conditions.Marland Kitchen Psychiatric Judgement and insight  Intact.. No evidence of depression, anxiety, or agitation.. General Notes: the patient continues to have boggy areas over his left heel and the lateral edges have an element of purulence and I have taken cultures today. There is no evidence of florid abscess or proximal cellulitis. This is a Personal assistant V diabetic foot ulcer. Integumentary (Hair, Skin) Chase, BOTERO A. ([id number]) No suspicious lesions. No crepitus or fluctuance. No peri-wound warmth or erythema. No masses.. Wound #1 status is Open. Original cause of wound was Gradually Appeared. The wound is located on the Left Calcaneus. The wound measures 6cm length x 12.8cm width x 0.8cm depth; 60.319cm^2 area and 48.255cm^3 volume. There is Fat Layer (Subcutaneous Tissue) Exposed exposed. There is no tunneling or undermining noted. There is a large amount of serous drainage noted. The wound margin is flat and intact. There is no granulation within the wound bed. There is a large (67-100%) amount  of necrotic tissue within the wound bed including Eschar and Adherent Slough. The periwound skin appearance exhibited: Erythema. The periwound skin appearance did not exhibit: Callus, Crepitus, Excoriation, Induration, Rash, Scarring, Dry/Scaly, Maceration, Atrophie Blanche, Cyanosis, Ecchymosis, Hemosiderin Staining, Mottled, Pallor, Rubor. The surrounding wound skin color is noted with erythema which is circumferential. Periwound temperature was noted as No Abnormality. The periwound has tenderness on palpation. 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 May  Shower, gently pat wound dry prior to applying new dressing. Anesthetic: Wound #1 Left Calcaneus: Topical Lidocaine 4% cream applied to wound bed prior to debridement Primary Wound Dressing: Wound #1 Left Calcaneus: Santyl Ointment Secondary Dressing: Wound #1 Left Calcaneus: Gauze, ABD and Kerlix/Conform Dressing Change Frequency: Wound #1 Left Calcaneus: Change dressing every day. Follow-up Appointments: Chase, Clark ([id number]) Wound #1 Left Calcaneus: Return Appointment in 1 week. Edema Control: Wound #1 Left Calcaneus: Elevate legs to the level of the heart and pump ankles as often as possible Off-Loading: Wound #1 Left Calcaneus: Other: - continue with knee scooter Additional Orders / Instructions: Wound #1 Left Calcaneus: Increase protein intake. Other: - please add vitamin a, vitamin c and zinc supplements to your diet Home Health: Wound #1 Left Calcaneus: Continue Home Health Visits - [facility] Nurse may visit PRN to address patient s wound care needs. FACE TO FACE ENCOUNTER: MEDICARE and MEDICAID PATIENTS: I certify that this patient is under my care and that I had a face-to-face encounter that meets the physician face-to-face encounter requirements with this patient on this date. The encounter with the patient was in whole or in part for the following MEDICAL CONDITION: (primary reason for [place]) MEDICAL NECESSITY: I certify, that based on my findings, NURSING services are a medically necessary home health service. HOME BOUND STATUS: I certify that my clinical findings support that this patient is homebound (i.e., Due to illness or injury, pt requires aid of supportive devices such as crutches, cane, wheelchairs, walkers, the use of special transportation or the assistance of another person to leave their place of residence. There is a normal inability to leave the home and doing so requires considerable and taxing effort. Other absences  are for medical reasons / religious services and are infrequent or of short duration when for other reasons). If current dressing causes regression in wound condition, may D/C ordered dressing product/s and apply Normal Saline Moist Dressing daily until next [place] / Other MD appointment. [place] of regression in wound condition at [phone]. Please direct any NON-WOUND related issues/requests for orders to patient's Primary Care Physician Medications-please add to medication list.: Wound #1 Left Calcaneus: Santyl Enzymatic Ointment Laboratory ordered were: Wound culture routine - Left heel The following medication(s) was prescribed: Bactrim DS oral 800 mg-160 mg tablet tablet oral bid starting 07/03/2016 He has significant peripheral arterial disease with a Wagner stage V ulceration of his left heel. This gentleman with significant comorbidities including poorly controlled diabetes with hyperglycemia has recently seen his podiatrist and superficial debridement of the wound was done in the office. after review today I still believe that he will need more aggressive debridement in  the OR setting and possible placement of a wound VAC. At this stage I have recommended: Chase, Clark ([id number]) 1. continue to see the podiatrist as before. 2. x-ray of the left foot to be done. 3. I have taken fresh pus cultures today -- appropriate change in antibiotics may be necessary 4. continue with conservative therapy with Santyl ointment and dry dressing for now 5. Good control of his diabetes mellitus and see an endocrinologist as soon as possible 6. Adequate protein, vitamin A, vitamin C and zinc the patient has had read all questions answered and I have clearly stated to him, if he has proximal cellulitis or symptoms of septicemia he should report to the ER immediately. He fully understands that if this issue does not get sorted out he may end up with a left  below-knee amputation. Electronic Signature(s) Signed: 07/03/2016 9:20:47 AM By: Christin Fudge MD, FACS Previous Signature: 06/30/2016 11:31:28 AM Version By: Christin Fudge MD, FACS Entered By: Christin Fudge on 07/03/2016 09:20:47 Chase Clark ([id number]) -------------------------------------------------------------------------------- SuperBill Details Patient Name: Chase Clark Date of Service: 06/30/2016 Medical Record Number: [id number] Patient Account Number: [id number] Date of Birth/Sex: [birth year] (51 y.o. 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 Facility Procedures CPT4 Code: [id number] Description: 96295 - WOUND CARE VISIT-LEV 3 EST PT Modifier: Quantity: 1 Physician Procedures CPT4: Description Modifier Quantity Code [id number] - WC PHYS LEVEL 3 - EST PT 1 ICD-10 Description Diagnosis E11.621 Type 2 diabetes mellitus with foot ulcer E11.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

## 2016-07-01 ENCOUNTER — Ambulatory Visit
Admission: RE | Admit: 2016-07-01 | Discharge: 2016-07-01 | Disposition: A | Discharge status: Home / Self Care | Payer: 59 | Source: Ambulatory Visit | Attending: Surgery | Admitting: Surgery

## 2016-07-01 ENCOUNTER — Other Ambulatory Visit: Payer: Self-pay | Admitting: Surgery

## 2016-07-01 DIAGNOSIS — M869 Osteomyelitis, unspecified: Secondary | ICD-10-CM

## 2016-07-01 DIAGNOSIS — E11621 Type 2 diabetes mellitus with foot ulcer: Secondary | ICD-10-CM | POA: Diagnosis not present

## 2016-07-01 DIAGNOSIS — I70244 Atherosclerosis of native arteries of left leg with ulceration of heel and midfoot: Secondary | ICD-10-CM | POA: Insufficient documentation

## 2016-07-01 DIAGNOSIS — M868X7 Other osteomyelitis, ankle and foot: Secondary | ICD-10-CM | POA: Insufficient documentation

## 2016-07-01 DIAGNOSIS — L97423 Non-pressure chronic ulcer of left heel and midfoot with necrosis of muscle: Secondary | ICD-10-CM | POA: Diagnosis not present

## 2016-07-01 DIAGNOSIS — E1152 Type 2 diabetes mellitus with diabetic peripheral angiopathy with gangrene: Secondary | ICD-10-CM | POA: Insufficient documentation

## 2016-07-01 DIAGNOSIS — S91302A Unspecified open wound, left foot, initial encounter: Secondary | ICD-10-CM | POA: Diagnosis not present

## 2016-07-01 NOTE — Progress Notes (Addendum)
Chase, Clark ([id number]) Visit Report for 06/30/2016 Arrival Information Details Patient Name: Chase Clark, Chase Clark Date of Service: 06/30/2016 10:30 AM Medical Record Number: [id number] Patient Account Number: [id number] Date of Birth/Sex: 11/11/1965 (51 y.o. Male) Treating RN: Cornell Barman Primary Care Provider: [name], ERIC Other Clinician: Referring Provider: [name], ERIC Treating Provider/Extender: Frann Rider in Treatment: 1 Visit Information History Since Last Visit Added or deleted any medications: Yes Patient Arrived: Other Any new allergies or adverse reactions: No Arrival Time: 10:32 Had a fall or experienced change in No Accompanied By: self activities of daily living that may affect Transfer Assistance: None risk of falls: Patient Identification Verified: Yes Signs or symptoms of abuse/neglect since last No Secondary Verification Process Completed: Yes visito Patient Has Alerts: Yes Hospitalized since last visit: No Patient Alerts: DMII Has Dressing in Place as Prescribed: Yes Has Footwear/Offloading in Place as Yes Prescribed: Pain Present Now: No Notes Patient is using knee scooter to get around. Electronic Signature(s) Signed: 06/30/2016 5:09:44 PM By: Gretta Cool, RN, BSN, Kim RN, BSN Entered By: Gretta Cool, RN, BSN, Kim on 06/30/2016 10:33:19 Chase Clark ([id number]) -------------------------------------------------------------------------------- Clinic Level of Care Assessment Details Patient Name: Chase Clark Date of Service: 06/30/2016 10:30 AM Medical Record Number: [id number] Patient Account Number: [id number] Date of Birth/Sex: 1966/01/21 (51 y.o. Male) Treating RN: Cornell Barman Primary Care Provider: [name], ERIC Other Clinician: Referring Provider: [name], ERIC Treating Provider/Extender: Frann Rider in Treatment: 1 Clinic Level of Care Assessment Items TOOL 4 Quantity Score []  - Use when only an EandM is performed  on FOLLOW-UP visit 0 ASSESSMENTS - Nursing Assessment / Reassessment X - Reassessment of Co-morbidities (includes updates in patient status) 1 10 X - Reassessment of Adherence to Treatment Plan 1 5 ASSESSMENTS - Wound and Skin Assessment / Reassessment X - Simple Wound Assessment / Reassessment - one wound 1 5 []  - Complex Wound Assessment / Reassessment - multiple wounds 0 []  - Dermatologic / Skin Assessment (not related to wound area) 0 ASSESSMENTS - Focused Assessment []  - Circumferential Edema Measurements - multi extremities 0 []  - Nutritional Assessment / Counseling / Intervention 0 []  - Lower Extremity Assessment (monofilament, tuning fork, pulses) 0 []  - Peripheral Arterial Disease Assessment (using hand held doppler) 0 ASSESSMENTS - Ostomy and/or Continence Assessment and Care []  - Incontinence Assessment and Management 0 []  - Ostomy Care Assessment and Management (repouching, etc.) 0 PROCESS - Coordination of Care X - Simple Patient / Family Education for ongoing care 1 15 []  - Complex (extensive) Patient / Family Education for ongoing care 0 X - Staff obtains Programmer, systems, Records, Test Results / Process Orders 1 10 []  - Staff telephones HHA, Nursing Homes / Clarify orders / etc 0 []  - Routine Transfer to another Facility (non-emergent condition) 0 IZSAK, MEIR ([id number]) []  - Routine Hospital Admission (non-emergent condition) 0 []  - New Admissions / Biomedical engineer / Ordering NPWT, Apligraf, etc. 0 []  - Emergency Hospital Admission (emergent condition) 0 X - Simple Discharge Coordination 1 10 []  - Complex (extensive) Discharge Coordination 0 PROCESS - Special Needs []  - Pediatric / Minor Patient Management 0 []  - Isolation Patient Management 0 []  - Hearing / Language / Visual special needs 0 []  - Assessment of Community assistance (transportation, D/C planning, etc.) 0 []  - Additional assistance / Altered mentation 0 []  - Support Surface(s) Assessment (bed,  cushion, seat, etc.) 0 INTERVENTIONS - Wound Cleansing / Measurement X - Simple Wound Cleansing - one wound 1 5 []  - Complex  Wound Cleansing - multiple wounds 0 X - Wound Imaging (photographs - any number of wounds) 1 5 []  - Wound Tracing (instead of photographs) 0 X - Simple Wound Measurement - one wound 1 5 []  - Complex Wound Measurement - multiple wounds 0 INTERVENTIONS - Wound Dressings X - Small Wound Dressing one or multiple wounds 1 10 []  - Medium Wound Dressing one or multiple wounds 0 []  - Large Wound Dressing one or multiple wounds 0 []  - Application of Medications - topical 0 []  - Application of Medications - injection 0 INTERVENTIONS - Miscellaneous []  - External ear exam 0 JARYN, ROSKO ([id number]) []  - Specimen Collection (cultures, biopsies, blood, body fluids, etc.) 0 []  - Specimen(s) / Culture(s) sent or taken to Lab for analysis 0 []  - Patient Transfer (multiple staff / Harrel Lemon Lift / Similar devices) 0 []  - Simple Staple / Suture removal (25 or less) 0 []  - Complex Staple / Suture removal (26 or more) 0 []  - Hypo / Hyperglycemic Management (close monitor of Blood Glucose) 0 []  - Ankle / Brachial Index (ABI) - do not check if billed separately 0 X - Vital Signs 1 5 Has the patient been seen at the hospital within the last three years: Yes Total Score: 85 Level Of Care: New/Established - Level 3 Electronic Signature(s) Signed: 06/30/2016 5:09:44 PM By: Gretta Cool, RN, BSN, Kim RN, BSN Entered By: Gretta Cool, RN, BSN, Kim on 06/30/2016 10:57:11 Chase Clark ([id number]) -------------------------------------------------------------------------------- Encounter Discharge Information Details Patient Name: Chase Clark Date of Service: 06/30/2016 10:30 AM Medical Record Number: [id number] Patient Account Number: [id number] Date of Birth/Sex: Mar 27, 1966 (51 y.o. Male) Treating RN: Cornell Barman Primary Care Provider: [name], ERIC Other Clinician: Referring Provider:  [name], ERIC Treating Provider/Extender: Frann Rider in Treatment: 1 Encounter Discharge Information Items Discharge Pain Level: 0 Discharge Condition: Stable Ambulatory Status: Walker Discharge Destination: Home Transportation: Private Auto Accompanied By: self Schedule Follow-up Appointment: Yes Medication Reconciliation completed Yes and provided to Patient/Care Provider: Provided on Clinical Summary of Care: 06/30/2016 Form Type Recipient Paper Patient BJ Notes knee scooter Electronic Signature(s) Signed: 06/30/2016 11:12:38 AM By: Ruthine Dose Entered By: Ruthine Dose on 06/30/2016 11:12:38 Chase Clark ([id number]) -------------------------------------------------------------------------------- Lower Extremity Assessment Details Patient Name: Chase Clark Date of Service: 06/30/2016 10:30 AM Medical Record Number: [id number] Patient Account Number: [id number] Date of Birth/Sex: [birth year] (51 y.o. Male) Treating RN: Cornell Barman Primary Care Provider: [name], ERIC Other Clinician: Referring Provider: [name], ERIC Treating Provider/Extender: Frann Rider in Treatment: 1 Edema Assessment Assessed: [Left: No] [Right: No] Edema: [Left: N] [Right: o] Vascular Assessment Claudication: Claudication Assessment [Left:None] Pulses: Dorsalis Pedis Palpable: [Left:Yes] Posterior Tibial Extremity colors, hair growth, and conditions: Extremity Color: [Left:Pale] Hair Growth on Extremity: [Left:No] Temperature of Extremity: [Left:Warm] Capillary Refill: [Left:> 3 seconds] Dependent Rubor: [Left:No] Blanched when Elevated: [Left:No] Lipodermatosclerosis: [Left:No] Toe Nail Assessment Left: Right: Thick: Yes Discolored: Yes Deformed: No Improper Length and Hygiene: No Electronic Signature(s) Signed: 06/30/2016 5:09:44 PM By: Gretta Cool, RN, BSN, Kim RN, BSN Entered By: Gretta Cool, RN, BSN, Kim on 06/30/2016 10:42:02 Chase Clark  ([id number]) -------------------------------------------------------------------------------- Multi Wound Chart Details Patient Name: Chase Clark Date of Service: 06/30/2016 10:30 AM Medical Record Number: [id number] Patient Account Number: [id number] Date of Birth/Sex: [birth year] (51 y.o. Male) Treating RN: Cornell Barman Primary Care Provider: [name], ERIC Other Clinician: Referring Provider: [name], ERIC Treating Provider/Extender: Frann Rider in Treatment: 1 Vital Signs Height(in): 71 Pulse(bpm): 105 Weight(lbs): 195 Blood Pressure 157/95 (mmHg): Body Mass  Index(BMI): 27 Temperature(F): 97.6 Respiratory Rate 18 (breaths/min): Photos: [N/A:N/A] Wound Location: Left Calcaneus N/A N/A Wounding Event: Gradually Appeared N/A N/A Primary Etiology: Arterial Insufficiency Ulcer N/A N/A Comorbid History: Coronary Artery Disease, N/A N/A Hypertension, Type II Diabetes, Osteoarthritis, Neuropathy Date Acquired: 04/25/2016 N/A N/A Weeks of Treatment: 1 N/A N/A Wound Status: Open N/A N/A Pending Amputation on Yes N/A N/A Presentation: Measurements L x W x D 6x12.8x0.8 N/A N/A (cm) Area (cm) : 60.319 N/A N/A Volume (cm) : 48.255 N/A N/A % Reduction in Area: 14.40% N/A N/A % Reduction in Volume: -242.50% N/A N/A Classification: Full Thickness Without N/A N/A Exposed Support Structures HBO Classification: Grade 2 N/A N/A Exudate Amount: Large N/A N/A Exudate Type: Serous N/A N/A KYDAN, SHANHOLTZER ([id number]) Exudate Color: amber N/A N/A Wound Margin: Flat and Intact N/A N/A Granulation Amount: None Present (0%) N/A N/A Necrotic Amount: Large (67-100%) N/A N/A Necrotic Tissue: Eschar, Adherent Slough N/A N/A Exposed Structures: Fat Layer (Subcutaneous N/A N/A Tissue) Exposed: Yes Fascia: No Tendon: No Muscle: No Joint: No Bone: No Epithelialization: None N/A N/A Periwound Skin Texture: Excoriation: No N/A N/A Induration: No Callus: No Crepitus:  No Rash: No Scarring: No Periwound Skin Maceration: No N/A N/A Moisture: Dry/Scaly: No Periwound Skin Color: Erythema: Yes N/A N/A Atrophie Blanche: No Cyanosis: No Ecchymosis: No Hemosiderin Staining: No Mottled: No Pallor: No Rubor: No Erythema Location: Circumferential N/A N/A Temperature: No Abnormality N/A N/A Tenderness on Yes N/A N/A Palpation: Wound Preparation: Ulcer Cleansing: N/A N/A Rinsed/Irrigated with Saline Topical Anesthetic Applied: Other: lidocaine 4% Treatment Notes Wound #1 (Left Calcaneus) 1. Cleansed with: Clean wound with Normal Saline 2. Anesthetic Topical Lidocaine 4% cream to wound bed prior to debridement 4. Dressing Applied: [address] IVAN, LACHER ([id number]) 5. Secondary Dressing Applied ABD Pad 6. Footwear/Offloading device applied Other footwear/offloading device applied (specify in notes) 7. Secured with Paper tape Notes Knee scooter Electronic Signature(s) Signed: 06/30/2016 11:25:37 AM By: Christin Fudge MD, FACS Entered By: Christin Fudge on 06/30/2016 11:25:37 Chase Clark ([id number]) -------------------------------------------------------------------------------- [place] Details Patient Name: Chase Clark Date of Service: 06/30/2016 10:30 AM Medical Record Number: [id number] Patient Account Number: [id number] Date of Birth/Sex: Oct 30, 1965 (51 y.o. Male) Treating RN: Cornell Barman Primary Care Provider: [name], ERIC Other Clinician: Referring Provider: [name], ERIC Treating Provider/Extender: Frann Rider in Treatment: 1 Active Inactive Electronic Signature(s) Signed: 07/23/2016 9:07:38 AM By: Gretta Cool RN, BSN, Kim RN, BSN Previous Signature: 06/30/2016 5:09:44 PM Version By: Gretta Cool RN, BSN, Kim RN, BSN Entered By: Gretta Cool, RN, BSN, Kim on 07/23/2016 09:07:38 Chase Clark ([id number]) -------------------------------------------------------------------------------- Pain  Assessment Details Patient Name: Chase Clark Date of Service: 06/30/2016 10:30 AM Medical Record Number: [id number] Patient Account Number: [id number] Date of Birth/Sex: [birth year] (51 y.o. Male) Treating RN: Cornell Barman Primary Care Provider: [name], ERIC Other Clinician: Referring Provider: [name], ERIC Treating Provider/Extender: Frann Rider in Treatment: 1 Active Problems Location of Pain Severity and Description of Pain Patient Has Paino No Site Locations With Dressing Change: No Pain Management and Medication Current Pain Management: Electronic Signature(s) Signed: 06/30/2016 5:09:44 PM By: Gretta Cool, RN, BSN, Kim RN, BSN Entered By: Gretta Cool, RN, BSN, Kim on 06/30/2016 10:33:38 Chase Clark ([id number]) -------------------------------------------------------------------------------- Patient/Caregiver Education Details Patient Name: Chase Clark Date of Service: 06/30/2016 10:30 AM Medical Record Number: [id number] Patient Account Number: [id number] Date of Birth/Gender: 1965/12/10 (51 y.o. Male) Treating RN: Cornell Barman Primary Care Physician: [name], ERIC Other Clinician: Referring Physician: [name], ERIC Treating Physician/Extender: Christin Fudge  Weeks in Treatment: 1 Education Assessment Education Provided To: Patient Education Topics Provided Infection: Handouts: Infection Prevention and Management Methods: Demonstration, Explain/Verbal Responses: State content correctly Wound/Skin Impairment: Handouts: Caring for Your Ulcer Methods: Demonstration, Explain/Verbal Responses: State content correctly Electronic Signature(s) Signed: 06/30/2016 5:09:44 PM By: Gretta Cool, RN, BSN, Kim RN, BSN Entered By: Gretta Cool, RN, BSN, Kim on 06/30/2016 10:59:27 Chase Clark ([id number]) -------------------------------------------------------------------------------- Wound Assessment Details Patient Name: Chase Clark Date of Service: 06/30/2016  10:30 AM Medical Record Number: [id number] Patient Account Number: [id number] Date of Birth/Sex: 08-05-1965 (51 y.o. 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 Wound Open Wounding Event: Gradually Appeared Status: Date Acquired: 04/25/2016 Comorbid Coronary Artery Disease, Weeks Of Treatment: 1 History: Hypertension, Type II Diabetes, Clustered Wound: No Osteoarthritis, Neuropathy Pending Amputation On Presentation Photos Wound Measurements Length: (cm) 6 Width: (cm) 12.8 Depth: (cm) 0.8 Area: (cm) 60.319 Volume: (cm) 48.255 % Reduction in Area: 14.4% % Reduction in Volume: -242.5% Epithelialization: None Tunneling: No Undermining: No Wound Description Full Thickness Without Foul Odor Afte Classification: Exposed Support Structures Slough/Fibrino Diabetic Severity Grade 2 (Wagner): Wound Margin: Flat and Intact Exudate Amount: Large Exudate Type: Serous Exudate Color: amber r Cleansing: No Yes Wound Bed Granulation Amount: None Present (0%) Exposed Structure Necrotic Amount: Large (67-100%) Fascia Exposed: No Necrotic Quality: Eschar, Adherent Slough Fat Layer (Subcutaneous Tissue) Exposed: Yes Tendon Exposed: No COURAGE, BIGLOW A. ([id number]) Muscle Exposed: No Joint Exposed: No Bone Exposed: No Periwound Skin Texture Texture Color No Abnormalities Noted: No No Abnormalities Noted: No Callus: No Atrophie Blanche: No Crepitus: No Cyanosis: No Excoriation: No Ecchymosis: No Induration: No Erythema: Yes Rash: No Erythema Location: Circumferential Scarring: No Hemosiderin Staining: No Mottled: No Moisture Pallor: No No Abnormalities Noted: No Rubor: No Dry / Scaly: No Maceration: No  Temperature / Pain Temperature: No Abnormality Tenderness on Palpation: Yes Wound Preparation Ulcer Cleansing: Rinsed/Irrigated with Saline Topical Anesthetic Applied: Other: lidocaine 4%, Electronic Signature(s) Signed: 06/30/2016 5:09:44 PM By: Gretta Cool, RN, BSN, Kim RN, BSN Entered By: Gretta Cool, RN, BSN, Kim on 06/30/2016 10:40:29 Chase Clark ([id number]) -------------------------------------------------------------------------------- [place] Details Patient Name: Chase Clark Date of Service: 06/30/2016 10:30 AM Medical Record Number: [id number] Patient Account Number: [id number] Date of Birth/Sex: December 07, 1965 (51 y.o. Male) Treating RN: Cornell Barman Primary Care Provider: [name], ERIC Other Clinician: Referring Provider: [name], ERIC Treating Provider/Extender: Frann Rider in Treatment: 1 Vital Signs Time Taken: 10:33 Temperature (F): 97.6 Height (in): 71 Pulse (bpm): 105 Weight (lbs): 195 Respiratory Rate (breaths/min): 18 Body Mass Index (BMI): 27.2 Blood Pressure (mmHg): 157/95 Reference Range: 80 - 120 mg / dl Electronic Signature(s) Signed: 06/30/2016 5:09:44 PM By: Gretta Cool, RN, BSN, Kim RN, BSN Entered By: Gretta Cool, RN, BSN, Kim on 06/30/2016 10:34:04

## 2016-07-03 ENCOUNTER — Ambulatory Visit: Payer: 59 | Admitting: Family Medicine

## 2016-07-03 DIAGNOSIS — E08621 Diabetes mellitus due to underlying condition with foot ulcer: Secondary | ICD-10-CM | POA: Diagnosis not present

## 2016-07-03 DIAGNOSIS — L97414 Non-pressure chronic ulcer of right heel and midfoot with necrosis of bone: Secondary | ICD-10-CM | POA: Diagnosis not present

## 2016-07-03 DIAGNOSIS — M86171 Other acute osteomyelitis, right ankle and foot: Secondary | ICD-10-CM | POA: Insufficient documentation

## 2016-07-03 DIAGNOSIS — M86172 Other acute osteomyelitis, left ankle and foot: Secondary | ICD-10-CM | POA: Diagnosis not present

## 2016-07-03 DIAGNOSIS — E1142 Type 2 diabetes mellitus with diabetic polyneuropathy: Secondary | ICD-10-CM | POA: Diagnosis not present

## 2016-07-03 DIAGNOSIS — L97424 Non-pressure chronic ulcer of left heel and midfoot with necrosis of bone: Secondary | ICD-10-CM | POA: Diagnosis not present

## 2016-07-03 LAB — AEROBIC CULTURE W GRAM STAIN (SUPERFICIAL SPECIMEN)

## 2016-07-05 DIAGNOSIS — L97429 Non-pressure chronic ulcer of left heel and midfoot with unspecified severity: Secondary | ICD-10-CM | POA: Diagnosis not present

## 2016-07-05 DIAGNOSIS — I10 Essential (primary) hypertension: Secondary | ICD-10-CM | POA: Diagnosis not present

## 2016-07-05 DIAGNOSIS — E11621 Type 2 diabetes mellitus with foot ulcer: Secondary | ICD-10-CM | POA: Diagnosis not present

## 2016-07-07 ENCOUNTER — Telehealth: Payer: Self-pay | Admitting: Family Medicine

## 2016-07-07 ENCOUNTER — Ambulatory Visit: Payer: 59 | Admitting: Surgery

## 2016-07-07 NOTE — Telephone Encounter (Signed)
Spouse called back and stated that the gabapentin is take 300 mg 2x twice daily (total of 4 tablets daily)

## 2016-07-07 NOTE — Telephone Encounter (Signed)
Patient notified and is scheduled for wednesday

## 2016-07-07 NOTE — Telephone Encounter (Signed)
Pt spouse called and is requesting a med refill. Pt was scheduled on 07/08/2022 but pt's mother passed away. Pt's PCP is no longer practicing. Pt is scheduled for 4/18 and he is out of medications. Pt is requesting gabapentin (NEURONTIN) 300 MG capsule 2x daily, and metFORMIN (GLUCOPHAGE) 1000 MG tablet 2x daily. Please advise, thank you!  Call [facility] @ [facility] [id number] - [place], [place]

## 2016-07-07 NOTE — Telephone Encounter (Signed)
Please advise 

## 2016-07-07 NOTE — Telephone Encounter (Signed)
I will not refill medications until patient is seen in the office as he has not established care with [place]. Please advise patient of this. If he needs refills of these things we can try to get him in sooner.

## 2016-07-09 ENCOUNTER — Encounter: Payer: Self-pay | Admitting: Family Medicine

## 2016-07-09 ENCOUNTER — Ambulatory Visit (INDEPENDENT_AMBULATORY_CARE_PROVIDER_SITE_OTHER): Payer: 59 | Admitting: Family Medicine

## 2016-07-09 ENCOUNTER — Other Ambulatory Visit (HOSPITAL_COMMUNITY)
Admission: RE | Admit: 2016-07-09 | Discharge: 2016-07-09 | Disposition: A | Discharge status: Home / Self Care | Payer: 59 | Source: Ambulatory Visit | Attending: Sports Medicine | Admitting: Sports Medicine

## 2016-07-09 VITALS — BP 120/68 | HR 112 | Temp 97.8°F | Ht 70.0 in | Wt 190.6 lb

## 2016-07-09 DIAGNOSIS — L97503 Non-pressure chronic ulcer of other part of unspecified foot with necrosis of muscle: Secondary | ICD-10-CM

## 2016-07-09 DIAGNOSIS — E1165 Type 2 diabetes mellitus with hyperglycemia: Secondary | ICD-10-CM | POA: Diagnosis not present

## 2016-07-09 DIAGNOSIS — L97429 Non-pressure chronic ulcer of left heel and midfoot with unspecified severity: Secondary | ICD-10-CM | POA: Diagnosis present

## 2016-07-09 DIAGNOSIS — E1139 Type 2 diabetes mellitus with other diabetic ophthalmic complication: Secondary | ICD-10-CM

## 2016-07-09 DIAGNOSIS — Z794 Long term (current) use of insulin: Secondary | ICD-10-CM

## 2016-07-09 DIAGNOSIS — R000 Tachycardia, unspecified: Secondary | ICD-10-CM | POA: Diagnosis not present

## 2016-07-09 DIAGNOSIS — IMO0002 Reserved for concepts with insufficient information to code with codable children: Secondary | ICD-10-CM

## 2016-07-09 DIAGNOSIS — I10 Essential (primary) hypertension: Secondary | ICD-10-CM | POA: Diagnosis not present

## 2016-07-09 DIAGNOSIS — E11621 Type 2 diabetes mellitus with foot ulcer: Secondary | ICD-10-CM | POA: Insufficient documentation

## 2016-07-09 LAB — CBC WITH DIFFERENTIAL/PLATELET
Basophils Absolute: 0 10*3/uL (ref 0.0–0.1)
Basophils Relative: 1 %
Eosinophils Absolute: 0.3 10*3/uL (ref 0.0–0.7)
Eosinophils Relative: 4 %
HCT: 32.8 % — ABNORMAL LOW (ref 39.0–52.0)
Hemoglobin: 10.7 g/dL — ABNORMAL LOW (ref 13.0–17.0)
Lymphocytes Relative: 21 %
Lymphs Abs: 1.9 10*3/uL (ref 0.7–4.0)
MCH: 27.9 pg (ref 26.0–34.0)
MCHC: 32.6 g/dL (ref 30.0–36.0)
MCV: 85.6 fL (ref 78.0–100.0)
Monocytes Absolute: 0.4 10*3/uL (ref 0.1–1.0)
Monocytes Relative: 5 %
Neutro Abs: 6.1 10*3/uL (ref 1.7–7.7)
Neutrophils Relative %: 69 %
Platelets: 278 10*3/uL (ref 150–400)
RBC: 3.83 MIL/uL — ABNORMAL LOW (ref 4.22–5.81)
RDW: 14.8 % (ref 11.5–15.5)
WBC: 8.8 10*3/uL (ref 4.0–10.5)

## 2016-07-09 LAB — COMPREHENSIVE METABOLIC PANEL
ALT: 14 U/L — ABNORMAL LOW (ref 17–63)
AST: 16 U/L (ref 15–41)
Albumin: 2.9 g/dL — ABNORMAL LOW (ref 3.5–5.0)
Alkaline Phosphatase: 71 U/L (ref 38–126)
Anion gap: 9 (ref 5–15)
BUN: 24 mg/dL — ABNORMAL HIGH (ref 6–20)
CO2: 28 mmol/L (ref 22–32)
Calcium: 9.8 mg/dL (ref 8.9–10.3)
Chloride: 100 mmol/L — ABNORMAL LOW (ref 101–111)
Creatinine, Ser: 0.85 mg/dL (ref 0.61–1.24)
GFR calc Af Amer: >60 mL/min (ref >60)
GFR calc non Af Amer: >60 mL/min (ref >60)
Glucose, Bld: 110 mg/dL — ABNORMAL HIGH (ref 65–99)
Potassium: 4.7 mmol/L (ref 3.5–5.1)
Sodium: 137 mmol/L (ref 135–145)
Total Bilirubin: 0.6 mg/dL (ref 0.3–1.2)
Total Protein: 6.5 g/dL (ref 6.5–8.1)

## 2016-07-09 LAB — C-REACTIVE PROTEIN: CRP: <0.8 mg/dL (ref <1.0)

## 2016-07-09 LAB — SEDIMENTATION RATE: Sed Rate: 102 mm/hr — ABNORMAL HIGH (ref 0–16)

## 2016-07-09 MED ORDER — METFORMIN HCL 1000 MG PO TABS: 1000.0000 mg | ORAL_TABLET | Freq: Two times a day (BID) | ORAL | 3 refills | Status: DC

## 2016-07-09 MED ORDER — CIPROFLOXACIN HCL 500 MG PO TABS
500.0000 mg | ORAL_TABLET | Freq: Two times a day (BID) | ORAL | 0 refills | Status: DC
Start: 2016-07-09 — End: 2016-07-23

## 2016-07-09 MED ORDER — GABAPENTIN 300 MG PO CAPS: 600.0000 mg | ORAL_CAPSULE | Freq: Two times a day (BID) | ORAL | 3 refills | Status: DC

## 2016-07-09 NOTE — Assessment & Plan Note (Signed)
Patient's sugars seem to be much better controlled with his current regimen of Lantus and NovoLog and metformin. I discussed a Lantus and NovoLog regimen with him and this is outlined in the AVS. They will continue to monitor his blood sugars at home. We'll plan on checking an A1c at his next visit.

## 2016-07-09 NOTE — Progress Notes (Signed)
[name], MD Phone: [phone]  Chase Clark is a 51 y.o. male who presents today for new patient visit.   Diabetes: Patient has been on Lantus ranging from 35-40 units in the morning and NovoLog ranging from 5-15 units 3 times a day with meals. He has also been on metformin. He was discharged from the hospital on these things. Reports typically if his sugar is around 110 before meals he takes 5 units if it's higher than that he takes 10 units. Also if his blood sugar is around that level in the morning will take 35 units and if it's higher than that'll take 40 units. He notes rare lows. No polyuria or polydipsia.  Patient has been followed by wound care for a calcaneal ulcer. He was followed by wound care and podiatry here in [place] and then recently established at [facility] in [facility]. Notes this was extensively debrided by Dr. Linton Rump in [facility] and the note from Dr. Linton Rump in [facility] was reviewed. He is seeing the PA there tomorrow. He notes no drainage. No fevers. They're doing Santyl and wet dressings. He has been on Augmentin. He had a bone biopsy done about a week ago through Dr. Celedonio Miyamoto office. He has also seen vascular surgery as one of the arteries in his leg does not work as well as the other 2 and the patient reports that they felt as though there would be plenty of blood flow to help this area heal. Patient and his wife both report that there has been exposed bone since debridement and that the area looks improved from previously. She notes he feels well at this time.  Patient typically checks his blood pressure at home occasionally. It is typically in the 130s over 80s. He takes lisinopril and losartan/HCTZ. He's not had any chest pain or trouble breathing. He reports a stress test about 6 months ago. He reports no issues with palpitations. He reports no arrhythmias previously. Does report his heart rate has been high at times.  Active Ambulatory Problems    Diagnosis  Date Noted  . DM (diabetes mellitus), type 2, uncontrolled w/ophthalmic complication ([place]) 21/30/8657  . Hyperlipidemia associated with type 2 diabetes mellitus ([place]) 07/17/2006  . GILBERT'S SYNDROME 07/17/2006  . Essential hypertension 07/17/2006  . Coronary atherosclerosis 07/17/2006  . OSTEOARTHRITIS 07/17/2006  . COUGH 07/17/2006  . Ulcer of foot ([place])   . Statin intolerance 06/10/2013  . 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 Obesity (BMI 30-39.9) 06/12/2013  . Pain in joint, shoulder region 06/12/2013  . Edema 06/28/2013  . Disorder of ligament of ankle 07/31/2013  . Anemia 01/12/2014  . DKA (diabetic ketoacidoses) ([place]) 05/17/2016  . Chronic diarrhea 05/17/2016  . Hyponatremia 05/17/2016  . Tachycardia 07/09/2016   Resolved Ambulatory Problems    Diagnosis Date Noted  . DM (diabetes mellitus) with complications ([place]) 84/69/6295   Past Medical History:  Diagnosis Date  . Arthritis   . Asthma   . Coronary artery disease   . Diabetes mellitus without complication ([place])   . Edema, lower extremity   . Fatty liver   . GERD (gastroesophageal reflux disease)   . Glaucoma   . Heart disease 2011  . Hyperlipidemia   . Hypertension   . Neuropathy ([place])   . Seasonal allergies   . Shoulder pain   . Ulcer of foot ([place]) 08.06.2014    Family History  Problem Relation Age of Onset  . Hyperlipidemia Mother   . Diabetes  Mother   . Hyperlipidemia Father   . Diabetes Father   . Heart disease Father   . Hypertension Father     Social History   Social History  . Marital status: Married    Spouse name: N/A  . Number of children: N/A  . Years of education: N/A   Occupational History  . Not on file.   Social History Main Topics  . Smoking status: Former Research scientist (life sciences)  . Smokeless tobacco: Never Used  . Alcohol use Yes  . Drug use: No  . Sexual activity: Not on file   Other Topics Concern  . Not on file   Social History Narrative  . No narrative on file     ROS  General:  Negative for nexplained weight loss, fever Skin: Negative for new or changing mole, sore that won't heal HEENT: Positive for trouble seeing, Negative for trouble hearing, ringing in ears, mouth sores, hoarseness, change in voice, dysphagia. CV:  Negative for chest pain, dyspnea, edema, palpitations Resp: Negative for cough, dyspnea, hemoptysis GI: Negative for nausea, vomiting, diarrhea, constipation, abdominal pain, melena, hematochezia. GU: Negative for dysuria, incontinence, urinary hesitance, hematuria, vaginal or penile discharge, polyuria, sexual difficulty, lumps in testicle or breasts MSK: Negative for muscle cramps or aches, joint pain or swelling Neuro: Negative for headaches, weakness, numbness, dizziness, passing out/fainting Psych: Negative for depression, anxiety, memory problems  Objective  Physical Exam Vitals:   07/09/16 1009 07/09/16 1102  BP: 120/68   Pulse: (!) 118 (!) 112  Temp: 97.8 F (36.6 C)     BP Readings from Last 3 Encounters:  07/09/16 120/68  06/21/16 122/85  06/09/16 (!) 140/95   Wt Readings from Last 3 Encounters:  07/09/16 190 lb 9.6 oz (86.5 kg)  06/21/16 196 lb (88.9 kg)  06/09/16 196 lb 3.2 oz (89 kg)    Physical Exam  Constitutional: No distress.  HENT:  Head: Normocephalic and atraumatic.  Mouth/Throat: Oropharynx is clear and moist. No oropharyngeal exudate.  Eyes: Conjunctivae are normal. Pupils are equal, round, and reactive to light.  Cardiovascular: Normal heart sounds.   Tachycardic, regular rhythm  Pulmonary/Chest: Effort normal and breath sounds normal.  Abdominal: Soft. Bowel sounds are normal. He exhibits no distension. There is no tenderness. There is no rebound and no guarding.  Musculoskeletal:  Left posterior heel with extensive ulceration with visible bone, no purulent drainage, no odor, no surrounding erythema or crepitance, foot is mildly swollen, foot feels warm  Neurological: He is alert.   Skin: Skin is warm and dry. He is not diaphoretic.  Psychiatric: Mood and affect normal.   EKG: Sinus tachycardia, rate 112, initially appeared to be atrial flutter though patient's cardiologist, Dr. Ubaldo Glassing, reviewed the EKG over the phone with me and advised that it was sinus tachycardia    I had a conversation with the patient at 11:48 AM. Patient found to possibly be in a flutter. Rate of 112. We've been trying to get his cardiologist on the phone and are awaiting a call back. Patient notes no symptoms. His blood pressure is stable. Patient has a funeral to go to for his mother that will take 10-15 minutes and this is occurring at noon. The patient wanted to leave to go to this and then come back to the office as there was no firm plan in place as we had not spoken with the cardiologist or the wound center in [facility]. I discussed that I would like to talk to Dr. Ubaldo Glassing first.  Discussed the risks of leaving the office with no medical attention and no plan in place. His wife is a Marine scientist and his sister is an emergency room nurse. He would like to go to the funeral and then come back to the office. Advised of the risks of this and he is willing to accept them.    Assessment/Plan:   Essential hypertension At goal. For some reason patient was on an ACE inhibitor and an ARB. Advised to discontinue the lisinopril. They will monitor his blood pressure. Discussed goal blood pressures and if they start to run higher than that they should contact us.  DM (diabetes mellitus), type 2, uncontrolled w/ophthalmic complication Patient's sugars seem to be much better controlled with his current regimen of Lantus and NovoLog and metformin. I discussed a Lantus and NovoLog regimen with him and this is outlined in the AVS. They will continue to monitor his blood sugars at home. We'll plan on checking an A1c at his next visit.  Ulcer of foot ([place]) Patient with ulceration and osteomyelitis given exposure of bone in  his left heel. Currently on Augmentin. Recent wound culture from the wound Center from [facility] was reviewed. Revealed Klebsiella. Not sensitive to penicillin antibiotics. We will place on ciprofloxacin. He will continue the Augmentin. X-ray from last week reviewed as well with possible bony disruption concerning for septic arthritis. Patient is well-appearing. He has no pain. He has no systemic symptoms. He had lab work done today with a normal white blood cell count and a normal CRP though had an elevated sedimentation rate. I attempted to discuss this with his orthopedic surgeon and wound physician in [facility] that the patient saw last week and had to leave a message initially as they were not open during the lunch hour. I finally got through to speak with somebody and was informed the physician was not in the office this week and that the PA that works with the physician was also out of the office this afternoon. There was no physician in the office at lunch and that they would be back after lunch. I spoke with the RN in the clinic there and advised of the history particularly concerning the wound culture from our system that had come back with Klebsiella and the x-ray findings. Advised that I was going to place the patient on ciprofloxacin. She noted that their wound culture there with the bone biopsy returned with skin flora. Discussed that the patient looked well though was tachycardic to some degree. She noted she would pass this along to the physician that was in the office this afternoon. I advised them that if needed they could contact me to discuss further. I discussed at length with the patient that if he had any new symptoms or develop fevers, chills, pain, redness, drainage, or any new symptoms he should be evaluated immediately prior to his evaluation tomorrow with the wound Center in [facility].  Tachycardia Patient with tachycardia noted on exam. EKG completed and reviewed by the  patient's cardiologist given the possibility for atrial flutter. Cardiologist felt it was sinus tachycardia. Potentially could be related to his wound. Less likely related to infection given lack of systemic symptoms otherwise and normal white blood cell count earlier today. Discussed monitoring for any symptoms and if they occurred he needed to be evaluated immediately.   Orders Placed This Encounter  Procedures  . EKG 12-Lead    Meds ordered this encounter  Medications  . amoxicillin-clavulanate (AUGMENTIN) 875-125  MG tablet    Sig: Take by mouth.  . ciprofloxacin (CIPRO) 500 MG tablet    Sig: Take 1 tablet (500 mg total) by mouth 2 (two) times daily.    Dispense:  14 tablet    Refill:  0  . metFORMIN (GLUCOPHAGE) 1000 MG tablet    Sig: Take 1 tablet (1,000 mg total) by mouth 2 (two) times daily with a meal.    Dispense:  180 tablet    Refill:  3  . gabapentin (NEURONTIN) 300 MG capsule    Sig: Take 2 capsules (600 mg total) by mouth 2 (two) times daily.    Dispense:  360 capsule    Refill:  3   I have spent >40 minutes in the care of this patient with >50% spent in counseling/coordination of care regarding Heel ulcer, osteomyelitis, and diabetes.   [name], MD [place]

## 2016-07-09 NOTE — Assessment & Plan Note (Signed)
At goal. For some reason patient was on an ACE inhibitor and an ARB. Advised to discontinue the lisinopril. They will monitor his blood pressure. Discussed goal blood pressures and if they start to run higher than that they should contact us.

## 2016-07-09 NOTE — Progress Notes (Signed)
Pre visit review using our clinic review tool, if applicable. No additional management support is needed unless otherwise documented below in the visit note. 

## 2016-07-09 NOTE — Patient Instructions (Addendum)
Nice to meet you. Please discontinue your lisinopril. You will need to monitor your blood pressure closely. If it is consistently greater than 130/90 after discontinuing the lisinopril you need to let us know. We will have you do Lantus 40 units in the morning for blood sugars greater than 150 and 35 units in the morning for blood sugars less than 150. You can use NovoLog 5 units for blood sugars less than 150 with meals and 10 units for blood sugars greater than 150 with meals. Please keep follow-up with the wound specialist tomorrow as planned. We'll start you on ciprofloxacin for the Klebsiella that grew out. If you develop any fevers, chills, palpitations, chest pain, shortness of breath, or any new or changing symptoms please seek medical attention immediately.

## 2016-07-09 NOTE — Assessment & Plan Note (Signed)
Patient with tachycardia noted on exam. EKG completed and reviewed by the patient's cardiologist given the possibility for atrial flutter. Cardiologist felt it was sinus tachycardia. Potentially could be related to his wound. Less likely related to infection given lack of systemic symptoms otherwise and normal white blood cell count earlier today. Discussed monitoring for any symptoms and if they occurred he needed to be evaluated immediately.

## 2016-07-09 NOTE — Assessment & Plan Note (Signed)
Patient with ulceration and osteomyelitis given exposure of bone in his left heel. Currently on Augmentin. Recent wound culture from the wound Center from [facility] was reviewed. Revealed Klebsiella. Not sensitive to penicillin antibiotics. We will place on ciprofloxacin. He will continue the Augmentin. X-ray from last week reviewed as well with possible bony disruption concerning for septic arthritis. Patient is well-appearing. He has no pain. He has no systemic symptoms. He had lab work done today with a normal white blood cell count and a normal CRP though had an elevated sedimentation rate. I attempted to discuss this with his orthopedic surgeon and wound physician in [facility] that the patient saw last week and had to leave a message initially as they were not open during the lunch hour. I finally got through to speak with somebody and was informed the physician was not in the office this week and that the PA that works with the physician was also out of the office this afternoon. There was no physician in the office at lunch and that they would be back after lunch. I spoke with the RN in the clinic there and advised of the history particularly concerning the wound culture from our system that had come back with Klebsiella and the x-ray findings. Advised that I was going to place the patient on ciprofloxacin. She noted that their wound culture there with the bone biopsy returned with skin flora. Discussed that the patient looked well though was tachycardic to some degree. She noted she would pass this along to the physician that was in the office this afternoon. I advised them that if needed they could contact me to discuss further. I discussed at length with the patient that if he had any new symptoms or develop fevers, chills, pain, redness, drainage, or any new symptoms he should be evaluated immediately prior to his evaluation tomorrow with the wound Center in [facility].

## 2016-07-10 DIAGNOSIS — L089 Local infection of the skin and subcutaneous tissue, unspecified: Secondary | ICD-10-CM | POA: Insufficient documentation

## 2016-07-10 DIAGNOSIS — Z452 Encounter for adjustment and management of vascular access device: Secondary | ICD-10-CM | POA: Diagnosis not present

## 2016-07-10 DIAGNOSIS — S92002A Unspecified fracture of left calcaneus, initial encounter for closed fracture: Secondary | ICD-10-CM | POA: Insufficient documentation

## 2016-07-10 DIAGNOSIS — E109 Type 1 diabetes mellitus without complications: Secondary | ICD-10-CM | POA: Diagnosis not present

## 2016-07-10 DIAGNOSIS — M86172 Other acute osteomyelitis, left ankle and foot: Secondary | ICD-10-CM | POA: Diagnosis not present

## 2016-07-10 DIAGNOSIS — E1165 Type 2 diabetes mellitus with hyperglycemia: Secondary | ICD-10-CM | POA: Diagnosis not present

## 2016-07-10 DIAGNOSIS — I10 Essential (primary) hypertension: Secondary | ICD-10-CM | POA: Diagnosis not present

## 2016-07-10 DIAGNOSIS — L97424 Non-pressure chronic ulcer of left heel and midfoot with necrosis of bone: Secondary | ICD-10-CM | POA: Diagnosis not present

## 2016-07-10 DIAGNOSIS — E1169 Type 2 diabetes mellitus with other specified complication: Secondary | ICD-10-CM | POA: Diagnosis not present

## 2016-07-10 DIAGNOSIS — S91001A Unspecified open wound, right ankle, initial encounter: Secondary | ICD-10-CM | POA: Diagnosis not present

## 2016-07-10 DIAGNOSIS — E11621 Type 2 diabetes mellitus with foot ulcer: Secondary | ICD-10-CM | POA: Diagnosis not present

## 2016-07-10 DIAGNOSIS — S92015A Nondisplaced fracture of body of left calcaneus, initial encounter for closed fracture: Secondary | ICD-10-CM | POA: Diagnosis not present

## 2016-07-10 DIAGNOSIS — S91302A Unspecified open wound, left foot, initial encounter: Secondary | ICD-10-CM | POA: Diagnosis not present

## 2016-07-10 DIAGNOSIS — E1142 Type 2 diabetes mellitus with diabetic polyneuropathy: Secondary | ICD-10-CM | POA: Diagnosis not present

## 2016-07-10 DIAGNOSIS — M869 Osteomyelitis, unspecified: Secondary | ICD-10-CM | POA: Diagnosis not present

## 2016-07-10 DIAGNOSIS — L89629 Pressure ulcer of left heel, unspecified stage: Secondary | ICD-10-CM | POA: Diagnosis not present

## 2016-07-10 LAB — HEMOGLOBIN A1C
Hgb A1c MFr Bld: 8.3 % — ABNORMAL HIGH (ref 4.8–5.6)
Mean Plasma Glucose: 192 mg/dL

## 2016-07-14 ENCOUNTER — Ambulatory Visit: Payer: 59 | Admitting: Surgery

## 2016-07-14 ENCOUNTER — Telehealth: Payer: Self-pay | Admitting: Family Medicine

## 2016-07-14 NOTE — Telephone Encounter (Signed)
The hospital at [facility] called looking to make a HFU for pt. Pt was in for left foot osteomyelitis. He is being discharged on Wednesday and is scheduled for 4/11 @ 11:30.

## 2016-07-17 ENCOUNTER — Other Ambulatory Visit: Payer: Self-pay

## 2016-07-17 ENCOUNTER — Telehealth: Payer: Self-pay | Admitting: Family Medicine

## 2016-07-17 DIAGNOSIS — E11621 Type 2 diabetes mellitus with foot ulcer: Secondary | ICD-10-CM | POA: Diagnosis not present

## 2016-07-17 DIAGNOSIS — E1169 Type 2 diabetes mellitus with other specified complication: Secondary | ICD-10-CM | POA: Diagnosis not present

## 2016-07-17 DIAGNOSIS — L089 Local infection of the skin and subcutaneous tissue, unspecified: Secondary | ICD-10-CM | POA: Diagnosis not present

## 2016-07-17 DIAGNOSIS — M86172 Other acute osteomyelitis, left ankle and foot: Secondary | ICD-10-CM | POA: Diagnosis not present

## 2016-07-17 MED ORDER — INSULIN GLARGINE 100 UNIT/ML ~~LOC~~ SOLN: 40.0000 [IU] | Freq: Every day | SUBCUTANEOUS | 0 refills | Status: DC

## 2016-07-17 NOTE — Telephone Encounter (Signed)
Pt wife called about pt needing a refill for insulin aspart (NOVOLOG) 100 UNIT/ML injection and insulin glargine (LANTUS) 100 UNIT/ML injection. Pt was just released from hospital. Pt does have an HFU appt already.  Pharmacy is [id number] - [place], [place]  Call wife @ [phone]. Thank you!

## 2016-07-17 NOTE — Telephone Encounter (Signed)
Left message to call.

## 2016-07-17 NOTE — Telephone Encounter (Signed)
Patient released from [facility] on 07/16/16 first attempt made to TCM message left for patient.

## 2016-07-18 MED ORDER — INSULIN ASPART 100 UNIT/ML ~~LOC~~ SOLN: 5.0000 [IU] | Freq: Three times a day (TID) | SUBCUTANEOUS | 1 refills | Status: DC

## 2016-07-18 MED ORDER — INSULIN GLARGINE 100 UNIT/ML ~~LOC~~ SOLN: 40.0000 [IU] | Freq: Every day | SUBCUTANEOUS | 3 refills | Status: DC

## 2016-07-18 NOTE — Telephone Encounter (Signed)
Insulin sent to pharmacy. Please find out what his blood pressure has been running. I need to know this to be able to give appropriate advice. Thanks.

## 2016-07-18 NOTE — Telephone Encounter (Signed)
Pt wife called back returning your call.   Call wife @ [phone]. Thank you!

## 2016-07-18 NOTE — Telephone Encounter (Signed)
Left voice mail to call back 

## 2016-07-18 NOTE — Telephone Encounter (Signed)
Transition Care Management Follow-up Telephone Call  How have you been since you were released from the hospital? Great, finally on road to recovery.   Do you understand why you were in the hospital?  Yes.   Do you understand the discharge instrcutions? Yes  Items Reviewed:  Medications reviewed: Yes, on IV antibiotics with Home health.Patient was advised to bring list of current medications.  Allergies reviewed: Yes,   Dietary changes reviewed: Low glycemic  Referrals reviewed: Yes, Dr. Linton Rump , [place].   Functional Questionnaire:   Activities of Daily Living (ADLs):   He states they are independent in the following:  Patient is independent in ADL's States they require assistance with the following: Cane, crutches for walking .   Any transportation issues/concerns?: No   Any patient concerns? NO,   Confirmed importance and date/time of follow-up visits scheduled: Yes,   Confirmed with patient if condition begins to worsen call PCP or go to the ER.  Patient was given the Call-a-Nurse line [phone]: Yes,

## 2016-07-18 NOTE — Telephone Encounter (Signed)
Spoke with patients wife  Patient was  in hospital .  He was discharged on  losartan/HCTZ 100/25 , Vancomycin, Coreg 6.25 mg Blood pressure has been running high since he came home last day or so.    Wife gave lisinopril this am to try and pull blood pressure down she concerned with high blood pressure readings . Patient is not complaining of headache or swellling.     Patient is scheduled  for hospital follow up.  07/23/16.  Please advise.

## 2016-07-18 NOTE — Telephone Encounter (Signed)
I would advise continuing to monitor his blood pressure. It appears that they have increased the carvedilol dose in the hospital and I would recommend continuing with that. He had been on lisinopril previously and I think it's reasonable to have taken this given that he had no issues with this previously though he should not continue to take this moving forward. We should set up follow-up early next week for reevaluation. If he develops any new symptoms he should be evaluated over the weekend.

## 2016-07-18 NOTE — Telephone Encounter (Signed)
Patient advised and verbalized understanding 

## 2016-07-18 NOTE — Telephone Encounter (Signed)
Blood pressure running 152/90 per [name] stopped Coreg, started lisinopril this am. BP has been running 150/90 since home from hospital yesterday.    Please advise.

## 2016-07-21 DIAGNOSIS — E08621 Diabetes mellitus due to underlying condition with foot ulcer: Secondary | ICD-10-CM | POA: Diagnosis not present

## 2016-07-21 DIAGNOSIS — L97414 Non-pressure chronic ulcer of right heel and midfoot with necrosis of bone: Secondary | ICD-10-CM | POA: Diagnosis not present

## 2016-07-21 DIAGNOSIS — E0842 Diabetes mellitus due to underlying condition with diabetic polyneuropathy: Secondary | ICD-10-CM | POA: Diagnosis not present

## 2016-07-21 DIAGNOSIS — E11610 Type 2 diabetes mellitus with diabetic neuropathic arthropathy: Secondary | ICD-10-CM | POA: Insufficient documentation

## 2016-07-22 ENCOUNTER — Other Ambulatory Visit (HOSPITAL_COMMUNITY)
Admission: RE | Admit: 2016-07-22 | Discharge: 2016-07-22 | Disposition: A | Discharge status: Home / Self Care | Payer: 59 | Source: Ambulatory Visit | Attending: Infectious Diseases | Admitting: Infectious Diseases

## 2016-07-22 DIAGNOSIS — E11621 Type 2 diabetes mellitus with foot ulcer: Secondary | ICD-10-CM | POA: Diagnosis not present

## 2016-07-22 DIAGNOSIS — M86172 Other acute osteomyelitis, left ankle and foot: Secondary | ICD-10-CM | POA: Insufficient documentation

## 2016-07-22 DIAGNOSIS — E1169 Type 2 diabetes mellitus with other specified complication: Secondary | ICD-10-CM | POA: Diagnosis not present

## 2016-07-22 LAB — CBC WITH DIFFERENTIAL/PLATELET
Basophils Absolute: 0 10*3/uL (ref 0.0–0.1)
Basophils Relative: 1 %
Eosinophils Absolute: 0.4 10*3/uL (ref 0.0–0.7)
Eosinophils Relative: 7 %
HCT: 31 % — ABNORMAL LOW (ref 39.0–52.0)
Hemoglobin: 10.3 g/dL — ABNORMAL LOW (ref 13.0–17.0)
Lymphocytes Relative: 20 %
Lymphs Abs: 1.4 10*3/uL (ref 0.7–4.0)
MCH: 28.8 pg (ref 26.0–34.0)
MCHC: 33.2 g/dL (ref 30.0–36.0)
MCV: 86.6 fL (ref 78.0–100.0)
Monocytes Absolute: 0.4 10*3/uL (ref 0.1–1.0)
Monocytes Relative: 6 %
Neutro Abs: 4.4 10*3/uL (ref 1.7–7.7)
Neutrophils Relative %: 66 %
Platelets: 190 10*3/uL (ref 150–400)
RBC: 3.58 MIL/uL — ABNORMAL LOW (ref 4.22–5.81)
RDW: 15.6 % — ABNORMAL HIGH (ref 11.5–15.5)
WBC: 6.7 10*3/uL (ref 4.0–10.5)

## 2016-07-22 LAB — VANCOMYCIN, TROUGH: Vancomycin Tr: 13 ug/mL — ABNORMAL LOW (ref 15–20)

## 2016-07-22 LAB — BASIC METABOLIC PANEL
Anion gap: 5 (ref 5–15)
BUN: 17 mg/dL (ref 6–20)
CO2: 26 mmol/L (ref 22–32)
Calcium: 9.3 mg/dL (ref 8.9–10.3)
Chloride: 106 mmol/L (ref 101–111)
Creatinine, Ser: 1.02 mg/dL (ref 0.61–1.24)
GFR calc Af Amer: >60 mL/min (ref >60)
GFR calc non Af Amer: >60 mL/min (ref >60)
Glucose, Bld: 172 mg/dL — ABNORMAL HIGH (ref 65–99)
Potassium: 4 mmol/L (ref 3.5–5.1)
Sodium: 137 mmol/L (ref 135–145)

## 2016-07-22 LAB — SEDIMENTATION RATE: Sed Rate: 73 mm/hr — ABNORMAL HIGH (ref 0–16)

## 2016-07-22 LAB — C-REACTIVE PROTEIN: CRP: <0.8 mg/dL (ref <1.0)

## 2016-07-23 ENCOUNTER — Encounter: Payer: Self-pay | Admitting: Family Medicine

## 2016-07-23 ENCOUNTER — Ambulatory Visit (INDEPENDENT_AMBULATORY_CARE_PROVIDER_SITE_OTHER): Payer: 59 | Admitting: Family Medicine

## 2016-07-23 DIAGNOSIS — M868X7 Other osteomyelitis, ankle and foot: Secondary | ICD-10-CM

## 2016-07-23 DIAGNOSIS — I10 Essential (primary) hypertension: Secondary | ICD-10-CM | POA: Diagnosis not present

## 2016-07-23 MED ORDER — CARVEDILOL 12.5 MG PO TABS: 12.5000 mg | ORAL_TABLET | Freq: Two times a day (BID) | ORAL | 3 refills | Status: DC

## 2016-07-23 NOTE — Progress Notes (Signed)
Pre visit review using our clinic review tool, if applicable. No additional management support is needed unless otherwise documented below in the visit note. 

## 2016-07-23 NOTE — Assessment & Plan Note (Signed)
Above goal. Has been high since we took him off of lisinopril. I discussed that the risk of lisinopril and losartan in combination does not outweigh the benefit. We will increase his carvedilol. He will take two 6.25 mg tablets twice daily until he runs out of his current prescription and then he'll start on the new prescription that I sent in. He will monitor his blood pressure at home. If not improving over the next week he'll contact us. I'll see him back in a month.

## 2016-07-23 NOTE — Patient Instructions (Signed)
Nice to see you. We are going to increase your carvedilol dose to 12.5 mg twice daily. We will continue losartan and hydrochlorothiazide. Please continue to monitor your blood pressure. Please let us know if your blood pressure continues to run elevated with this change. I would like to see you back in about a month for recheck of blood pressure. If you develop fevers, pain in her foot, or any new or changing symptoms please seek medical attention immediately.

## 2016-07-23 NOTE — Progress Notes (Signed)
  Chase Rumps, MD Phone: [phone]  Chase Clark is a 51 y.o. male who presents today for follow-up from hospitalization.  Patient was hospitalized at [facility] in [facility] for osteomyelitis of the left calcaneus. He underwent debridement and was placed on IV antibiotics. He is currently on IV vancomycin and Zosyn. He followed up with his orthopedist earlier this week and had some mild debridement done again. He sees infectious disease next week. He has been told the area looks improved. He notes no fevers or chills.  His blood pressure has been running elevated since we took him off of lisinopril. He was previously on lisinopril and losartan. He had not had any potassium issues. His blood pressures are averaging 150/90 at home. They started him on carvedilol in the hospital. They increased his dose prior to discharge though his blood pressure continues to run higher. He notes no chest pain, shortness breath, or edema. Patient's discharge summary was reviewed. Medications have been reviewed as well. A1c was quite a bit improved.  PMH: Former smoker   ROS see history of present illness  Objective  Physical Exam Vitals:   07/23/16 1126  BP: (!) 176/102  Pulse: 93  Temp: 98.1 F (36.7 C)    BP Readings from Last 3 Encounters:  07/23/16 (!) 176/102  07/09/16 120/68  06/21/16 122/85   Wt Readings from Last 3 Encounters:  07/09/16 190 lb 9.6 oz (86.5 kg)  06/21/16 196 lb (88.9 kg)  06/09/16 196 lb 3.2 oz (89 kg)    Physical Exam  Constitutional: No distress.  HENT:  Head: Normocephalic and atraumatic.  Cardiovascular: Normal rate, regular rhythm and normal heart sounds.   Pulmonary/Chest: Effort normal and breath sounds normal.  Neurological: He is alert. Gait normal.  Skin: Skin is warm and dry. He is not diaphoretic.  Significant left calcaneal ulcer noted with improved granulation tissue, still one area of bone visible, no odor, surrounding erythema, or significant  discharge  Psychiatric: Mood and affect normal.     Assessment/Plan: Please see individual problem list.  Osteomyelitis ([place]) Patient underwent debridement and is on IV antibiotics for osteomyelitis. He is following with wound care through [facility] and is following with infectious disease. The area does appear to have some improved tissue compared to the last time I saw him. Discussed continuing to monitor the area and continuing to follow with wound care and infectious disease for appropriate treatment. Advised that if he develops any signs of infection he should be evaluated.  Essential hypertension Above goal. Has been high since we took him off of lisinopril. I discussed that the risk of lisinopril and losartan in combination does not outweigh the benefit. We will increase his carvedilol. He will take two 6.25 mg tablets twice daily until he runs out of his current prescription and then he'll start on the new prescription that I sent in. He will monitor his blood pressure at home. If not improving over the next week he'll contact us. I'll see him back in a month.   No orders of the defined types were placed in this encounter.   Meds ordered this encounter  Medications  . carvedilol (COREG) 12.5 MG tablet    Sig: Take 1 tablet (12.5 mg total) by mouth 2 (two) times daily with a meal.    Dispense:  60 tablet    Refill:  [place], MD [place]

## 2016-07-23 NOTE — Assessment & Plan Note (Signed)
Patient underwent debridement and is on IV antibiotics for osteomyelitis. He is following with wound care through [facility] and is following with infectious disease. The area does appear to have some improved tissue compared to the last time I saw him. Discussed continuing to monitor the area and continuing to follow with wound care and infectious disease for appropriate treatment. Advised that if he develops any signs of infection he should be evaluated.

## 2016-07-24 DIAGNOSIS — L089 Local infection of the skin and subcutaneous tissue, unspecified: Secondary | ICD-10-CM | POA: Diagnosis not present

## 2016-07-24 DIAGNOSIS — M86172 Other acute osteomyelitis, left ankle and foot: Secondary | ICD-10-CM | POA: Diagnosis not present

## 2016-07-28 DIAGNOSIS — L97424 Non-pressure chronic ulcer of left heel and midfoot with necrosis of bone: Secondary | ICD-10-CM | POA: Diagnosis not present

## 2016-07-28 DIAGNOSIS — E11621 Type 2 diabetes mellitus with foot ulcer: Secondary | ICD-10-CM | POA: Diagnosis not present

## 2016-07-28 DIAGNOSIS — E11610 Type 2 diabetes mellitus with diabetic neuropathic arthropathy: Secondary | ICD-10-CM | POA: Diagnosis not present

## 2016-07-28 DIAGNOSIS — M86172 Other acute osteomyelitis, left ankle and foot: Secondary | ICD-10-CM | POA: Diagnosis not present

## 2016-07-28 DIAGNOSIS — E08621 Diabetes mellitus due to underlying condition with foot ulcer: Secondary | ICD-10-CM | POA: Diagnosis not present

## 2016-07-28 DIAGNOSIS — Z9181 History of falling: Secondary | ICD-10-CM | POA: Insufficient documentation

## 2016-07-28 DIAGNOSIS — L97414 Non-pressure chronic ulcer of right heel and midfoot with necrosis of bone: Secondary | ICD-10-CM | POA: Diagnosis not present

## 2016-07-28 DIAGNOSIS — E1169 Type 2 diabetes mellitus with other specified complication: Secondary | ICD-10-CM | POA: Diagnosis not present

## 2016-07-29 ENCOUNTER — Other Ambulatory Visit (HOSPITAL_COMMUNITY)
Admission: RE | Admit: 2016-07-29 | Discharge: 2016-07-29 | Disposition: A | Discharge status: Home / Self Care | Payer: 59 | Source: Other Acute Inpatient Hospital | Attending: Infectious Diseases | Admitting: Infectious Diseases

## 2016-07-29 ENCOUNTER — Encounter: Payer: Self-pay | Admitting: Family Medicine

## 2016-07-29 DIAGNOSIS — E119 Type 2 diabetes mellitus without complications: Secondary | ICD-10-CM | POA: Diagnosis not present

## 2016-07-29 DIAGNOSIS — L97426 Non-pressure chronic ulcer of left heel and midfoot with bone involvement without evidence of necrosis: Secondary | ICD-10-CM | POA: Insufficient documentation

## 2016-07-29 DIAGNOSIS — M86172 Other acute osteomyelitis, left ankle and foot: Secondary | ICD-10-CM | POA: Insufficient documentation

## 2016-07-29 DIAGNOSIS — E11621 Type 2 diabetes mellitus with foot ulcer: Secondary | ICD-10-CM | POA: Diagnosis not present

## 2016-07-29 DIAGNOSIS — E1169 Type 2 diabetes mellitus with other specified complication: Secondary | ICD-10-CM | POA: Diagnosis not present

## 2016-07-29 LAB — CBC WITH DIFFERENTIAL/PLATELET
Basophils Absolute: 0.1 10*3/uL (ref 0.0–0.1)
Basophils Relative: 1 %
Eosinophils Absolute: 0.5 10*3/uL (ref 0.0–0.7)
Eosinophils Relative: 7 %
HCT: 31.3 % — ABNORMAL LOW (ref 39.0–52.0)
Hemoglobin: 10.6 g/dL — ABNORMAL LOW (ref 13.0–17.0)
Lymphocytes Relative: 21 %
Lymphs Abs: 1.3 10*3/uL (ref 0.7–4.0)
MCH: 29 pg (ref 26.0–34.0)
MCHC: 33.9 g/dL (ref 30.0–36.0)
MCV: 85.8 fL (ref 78.0–100.0)
Monocytes Absolute: 0.5 10*3/uL (ref 0.1–1.0)
Monocytes Relative: 7 %
Neutro Abs: 4.2 10*3/uL (ref 1.7–7.7)
Neutrophils Relative %: 64 %
Platelets: 191 10*3/uL (ref 150–400)
RBC: 3.65 MIL/uL — ABNORMAL LOW (ref 4.22–5.81)
RDW: 15.1 % (ref 11.5–15.5)
WBC: 6.5 10*3/uL (ref 4.0–10.5)

## 2016-07-29 LAB — BASIC METABOLIC PANEL
Anion gap: 9 (ref 5–15)
BUN: 19 mg/dL (ref 6–20)
CO2: 25 mmol/L (ref 22–32)
Calcium: 9.4 mg/dL (ref 8.9–10.3)
Chloride: 106 mmol/L (ref 101–111)
Creatinine, Ser: 0.88 mg/dL (ref 0.61–1.24)
GFR calc Af Amer: >60 mL/min (ref >60)
GFR calc non Af Amer: >60 mL/min (ref >60)
Glucose, Bld: 85 mg/dL (ref 65–99)
Potassium: 4.2 mmol/L (ref 3.5–5.1)
Sodium: 140 mmol/L (ref 135–145)

## 2016-07-29 LAB — C-REACTIVE PROTEIN: CRP: <0.8 mg/dL (ref <1.0)

## 2016-07-29 LAB — SEDIMENTATION RATE: Sed Rate: 69 mm/hr — ABNORMAL HIGH (ref 0–16)

## 2016-07-29 LAB — VANCOMYCIN, TROUGH: Vancomycin Tr: 16 ug/mL (ref 15–20)

## 2016-07-30 ENCOUNTER — Ambulatory Visit: Payer: 59 | Admitting: Family Medicine

## 2016-07-31 ENCOUNTER — Other Ambulatory Visit: Payer: Self-pay | Admitting: Family Medicine

## 2016-07-31 DIAGNOSIS — L089 Local infection of the skin and subcutaneous tissue, unspecified: Secondary | ICD-10-CM | POA: Diagnosis not present

## 2016-07-31 DIAGNOSIS — M86172 Other acute osteomyelitis, left ankle and foot: Secondary | ICD-10-CM | POA: Diagnosis not present

## 2016-07-31 MED ORDER — DIPHENOXYLATE-ATROPINE 2.5-0.025 MG PO TABS: 1.0000 | ORAL_TABLET | Freq: Four times a day (QID) | ORAL | 0 refills | Status: DC | PRN

## 2016-08-04 DIAGNOSIS — L97414 Non-pressure chronic ulcer of right heel and midfoot with necrosis of bone: Secondary | ICD-10-CM | POA: Diagnosis not present

## 2016-08-04 DIAGNOSIS — E08621 Diabetes mellitus due to underlying condition with foot ulcer: Secondary | ICD-10-CM | POA: Diagnosis not present

## 2016-08-04 DIAGNOSIS — E0842 Diabetes mellitus due to underlying condition with diabetic polyneuropathy: Secondary | ICD-10-CM | POA: Diagnosis not present

## 2016-08-05 ENCOUNTER — Other Ambulatory Visit (HOSPITAL_COMMUNITY)
Admission: RE | Admit: 2016-08-05 | Discharge: 2016-08-05 | Disposition: A | Discharge status: Home / Self Care | Payer: 59 | Source: Other Acute Inpatient Hospital | Attending: Infectious Diseases | Admitting: Infectious Diseases

## 2016-08-05 DIAGNOSIS — M86172 Other acute osteomyelitis, left ankle and foot: Secondary | ICD-10-CM | POA: Insufficient documentation

## 2016-08-05 DIAGNOSIS — L97426 Non-pressure chronic ulcer of left heel and midfoot with bone involvement without evidence of necrosis: Secondary | ICD-10-CM | POA: Insufficient documentation

## 2016-08-05 DIAGNOSIS — E1169 Type 2 diabetes mellitus with other specified complication: Secondary | ICD-10-CM | POA: Diagnosis not present

## 2016-08-05 DIAGNOSIS — E11621 Type 2 diabetes mellitus with foot ulcer: Secondary | ICD-10-CM | POA: Diagnosis not present

## 2016-08-05 LAB — C-REACTIVE PROTEIN: CRP: <0.8 mg/dL (ref <1.0)

## 2016-08-05 LAB — BASIC METABOLIC PANEL
Anion gap: 6 (ref 5–15)
BUN: 22 mg/dL — ABNORMAL HIGH (ref 6–20)
CO2: 27 mmol/L (ref 22–32)
Calcium: 9.3 mg/dL (ref 8.9–10.3)
Chloride: 106 mmol/L (ref 101–111)
Creatinine, Ser: 1.28 mg/dL — ABNORMAL HIGH (ref 0.61–1.24)
GFR calc Af Amer: >60 mL/min (ref >60)
GFR calc non Af Amer: >60 mL/min (ref >60)
Glucose, Bld: 89 mg/dL (ref 65–99)
Potassium: 4 mmol/L (ref 3.5–5.1)
Sodium: 139 mmol/L (ref 135–145)

## 2016-08-05 LAB — CBC WITH DIFFERENTIAL/PLATELET
Basophils Absolute: 0.1 10*3/uL (ref 0.0–0.1)
Basophils Relative: 1 %
Eosinophils Absolute: 0.4 10*3/uL (ref 0.0–0.7)
Eosinophils Relative: 6 %
HCT: 30.7 % — ABNORMAL LOW (ref 39.0–52.0)
Hemoglobin: 10.4 g/dL — ABNORMAL LOW (ref 13.0–17.0)
Lymphocytes Relative: 20 %
Lymphs Abs: 1.3 10*3/uL (ref 0.7–4.0)
MCH: 29.1 pg (ref 26.0–34.0)
MCHC: 33.9 g/dL (ref 30.0–36.0)
MCV: 85.8 fL (ref 78.0–100.0)
Monocytes Absolute: 0.6 10*3/uL (ref 0.1–1.0)
Monocytes Relative: 8 %
Neutro Abs: 4.4 10*3/uL (ref 1.7–7.7)
Neutrophils Relative %: 65 %
Platelets: 205 10*3/uL (ref 150–400)
RBC: 3.58 MIL/uL — ABNORMAL LOW (ref 4.22–5.81)
RDW: 14.6 % (ref 11.5–15.5)
WBC: 6.7 10*3/uL (ref 4.0–10.5)

## 2016-08-05 LAB — SEDIMENTATION RATE: Sed Rate: 73 mm/hr — ABNORMAL HIGH (ref 0–16)

## 2016-08-05 LAB — VANCOMYCIN, TROUGH: Vancomycin Tr: 21 ug/mL (ref 15–20)

## 2016-08-06 DIAGNOSIS — L089 Local infection of the skin and subcutaneous tissue, unspecified: Secondary | ICD-10-CM | POA: Diagnosis not present

## 2016-08-06 DIAGNOSIS — M86172 Other acute osteomyelitis, left ankle and foot: Secondary | ICD-10-CM | POA: Diagnosis not present

## 2016-08-07 ENCOUNTER — Encounter (INDEPENDENT_AMBULATORY_CARE_PROVIDER_SITE_OTHER): Payer: Self-pay | Admitting: Vascular Surgery

## 2016-08-07 ENCOUNTER — Ambulatory Visit (INDEPENDENT_AMBULATORY_CARE_PROVIDER_SITE_OTHER): Payer: 59 | Admitting: Vascular Surgery

## 2016-08-07 VITALS — BP 113/81 | HR 86 | Resp 16 | Wt 190.0 lb

## 2016-08-07 DIAGNOSIS — L97324 Non-pressure chronic ulcer of left ankle with necrosis of bone: Secondary | ICD-10-CM | POA: Diagnosis not present

## 2016-08-07 DIAGNOSIS — M868X7 Other osteomyelitis, ankle and foot: Secondary | ICD-10-CM

## 2016-08-07 DIAGNOSIS — E785 Hyperlipidemia, unspecified: Secondary | ICD-10-CM | POA: Diagnosis not present

## 2016-08-07 DIAGNOSIS — L97309 Non-pressure chronic ulcer of unspecified ankle with unspecified severity: Secondary | ICD-10-CM | POA: Insufficient documentation

## 2016-08-07 DIAGNOSIS — I739 Peripheral vascular disease, unspecified: Secondary | ICD-10-CM | POA: Insufficient documentation

## 2016-08-07 DIAGNOSIS — E1169 Type 2 diabetes mellitus with other specified complication: Secondary | ICD-10-CM | POA: Diagnosis not present

## 2016-08-07 NOTE — Progress Notes (Signed)
MRN : [id number]  Chase Clark is a 51 y.o. (19-Jul-1965) male who presents with chief complaint of  Chief Complaint  Patient presents with  . Follow-up  .  History of Present Illness: The patient is seen for evaluation of painful lower extremities and diminished pulses associated with ulceration of the right foot.  The patient notes the ulcer has been present for multiple weeks and has not been improving.  It is very painful and has had some drainage.  No specific history of trauma noted by the patient.  The patient denies fever or chills.  the patient does have diabetes which has been difficult to control.  He has now switch to a podiatrist in [facility] and has undergone debridement of his heel with culture.  He is now on IV antibiotics via a right PIC and his wound has been improving.  The patient denies rest pain or dangling of an extremity off the side of the bed during the night for relief. No prior interventions or surgeries.  No history of back problems or DJD of the lumbar sacral spine.   The patient denies amaurosis fugax or recent TIA symptoms. There are no recent neurological changes noted. The patient denies history of DVT, PE or superficial thrombophlebitis. The patient denies recent episodes of angina or shortness of breath.   Current Meds  Medication Sig  . aspirin EC 81 MG tablet Take 81 mg by mouth daily.  . brimonidine-timolol (COMBIGAN) 0.2-0.5 % ophthalmic solution Place 1 drop into both eyes 2 (two) times daily.  . carvedilol (COREG) 12.5 MG tablet Take 1 tablet (12.5 mg total) by mouth 2 (two) times daily with a meal.  . cholestyramine light (PREVALITE) 4 g packet Take 1 packet (4 g total) by mouth daily.  . clopidogrel (PLAVIX) 75 MG tablet Take 1 tablet (75 mg total) by mouth daily.  . collagenase (SANTYL) ointment Apply topically daily.  . Difluprednate (DUREZOL) 0.05 % EMUL Apply 1 drop to eye at bedtime. Left eye  . diphenoxylate-atropine (LOMOTIL)  2.5-0.025 MG tablet Take 1 tablet by mouth 4 (four) times daily as needed for diarrhea or loose stools.  Marland Kitchen esomeprazole (NEXIUM) 40 MG capsule Take 1 capsule (40 mg total) by mouth daily.  Marland Kitchen gabapentin (NEURONTIN) 300 MG capsule Take 2 capsules (600 mg total) by mouth 2 (two) times daily.  Marland Kitchen glucosamine-chondroitin 500-400 MG tablet Take 1 tablet by mouth daily.  . insulin aspart (NOVOLOG) 100 UNIT/ML injection Inject 5 Units into the skin 3 (three) times daily with meals.  . insulin glargine (LANTUS) 100 UNIT/ML injection Inject 0.4 mLs (40 Units total) into the skin daily.  Marland Kitchen latanoprost (XALATAN) 0.005 % ophthalmic solution Place 1 drop into both eyes at bedtime.  Marland Kitchen losartan-hydrochlorothiazide (HYZAAR) 100-25 MG tablet Take 1 tablet by mouth daily.  . metFORMIN (GLUCOPHAGE) 1000 MG tablet Take 1 tablet (1,000 mg total) by mouth 2 (two) times daily with a meal.  . Multiple Vitamin (MULTIVITAMIN) tablet Take 1 tablet by mouth daily.    Past Medical History:  Diagnosis Date  . Arthritis   . Asthma    as child  . Coronary artery disease   . Diabetes mellitus without complication ([place])   . Edema, lower extremity   . Fatty liver   . GERD (gastroesophageal reflux disease)   . Glaucoma   . Heart disease 2011   Patient has stent for 80% blockage  . Hyperlipidemia   . Hypertension   . Neuropathy   .  Seasonal allergies   . Shoulder pain   . Ulcer of foot ([place]) 08.06.2014   DIABETIC ULCERATIONS ASSOCIATED WITH IRRITATION LATERAL ANKLE LEFT GREATER THAN RIGHT WITH MILD CELLULITIS    Past Surgical History:  Procedure Laterality Date  . ABDOMINAL AORTOGRAM N/A 05/20/2016   Procedure: Abdominal Aortogram possible intervention;  Surgeon: Katha Cabal, MD;  Location: [facility];  Service: Cardiovascular;  Laterality: N/A;  . CARDIAC CATHETERIZATION    . CATARACT EXTRACTION W/ INTRAOCULAR LENS IMPLANT    . CHOLECYSTECTOMY N/A   . CORONARY ANGIOPLASTY WITH STENT PLACEMENT  2007     1st diagonal  . EPIBLEPHERON REPAIR WITH TEAR DUCT PROBING    . EYE SURGERY Right sept 29n2015   cataract extraction  . INSERTION EXPRESS TUBE SHUNT Right 09/06/2015   Procedure: INSERTION AHMED TUBE SHUNT with tutoplast allograft;  Surgeon: Eulogio Bear, MD;  Location: [facility];  Service: Ophthalmology;  Laterality: Right;  Marland Kitchen VASECTOMY      Social History Social History  Substance Use Topics  . Smoking status: Former Research scientist (life sciences)  . Smokeless tobacco: Never Used  . Alcohol use Yes    Family History Family History  Problem Relation Age of Onset  . Hyperlipidemia Mother   . Diabetes Mother   . Hyperlipidemia Father   . Diabetes Father   . Heart disease Father   . Hypertension Father     Allergies  Allergen Reactions  . Atorvastatin     Myalgias   . Codeine Sulfate Nausea Only     REVIEW OF SYSTEMS (Negative unless checked)  Constitutional: [] Weight loss  [] Fever  [] Chills Cardiac: [] Chest pain   [] Chest pressure   [] Palpitations   [] Shortness of breath when laying flat   [] Shortness of breath with exertion. Vascular:  [x] Pain in legs with walking   [] Pain in legs at rest  [] History of DVT   [] Phlebitis   [] Swelling in legs   [] Varicose veins   [x] Non-healing ulcers Pulmonary:   [] Uses home oxygen   [] Productive cough   [] Hemoptysis   [] Wheeze  [] COPD   [] Asthma Neurologic:  [] Dizziness   [] Seizures   [] History of stroke   [] History of TIA  [] Aphasia   [] Vissual changes   [] Weakness or numbness in arm   [] Weakness or numbness in leg Musculoskeletal:   [] Joint swelling   [x] Joint pain   [] Low back pain Hematologic:  [] Easy bruising  [] Easy bleeding   [] Hypercoagulable state   [] Anemic Gastrointestinal:  [] Diarrhea   [] Vomiting  [] Gastroesophageal reflux/heartburn   [] Difficulty swallowing. Genitourinary:  [] Chronic kidney disease   [] Difficult urination  [] Frequent urination   [] Blood in urine Skin:  [x] Rashes   [x] Ulcers  Psychological:  [] History of anxiety   []   History of major depression.  Physical Examination  Vitals:   08/07/16 1620  BP: 113/81  Pulse: 86  Resp: 16  Weight: 86.2 kg (190 lb)   Body mass index is 27.26 kg/m. Gen: WD/WN, NAD Head: [place]/AT, No temporalis wasting.  Ear/Nose/Throat: Hearing grossly intact, nares w/o erythema or drainage Eyes: PER, EOMI, sclera nonicteric.  Neck: Supple, no large masses.   Pulmonary:  Good air movement, no audible wheezing bilaterally, no use of accessory muscles.  Cardiac: RRR, no JVD Vascular: right heel ulcer, PIC inle right antecubital Vessel Right Left  Radial Palpable Palpable  Ulnar Palpable Palpable  Brachial Palpable Palpable  Carotid Palpable Palpable  Femoral Palpable Palpable  Popliteal Not Palpable Palpable  PT Trace Palpable Palpable  DP Trace  Palpable Palpable  Gastrointestinal: Non-distended. No guarding/no peritoneal signs.  Musculoskeletal: M/S 5/5 throughout.  No deformity or atrophy.  Neurologic: CN 2-12 intact. Symmetrical.  Speech is fluent. Motor exam as listed above. Psychiatric: Judgment intact, Mood & affect appropriate for pt's clinical situation. Dermatologic: + right ankle rashes with  ulcers noted.  No changes consistent with cellulitis. Lymph : No lichenification or skin changes of chronic lymphedema.  CBC Lab Results  Component Value Date   WBC 6.7 08/05/2016   HGB 10.4 (L) 08/05/2016   HCT 30.7 (L) 08/05/2016   MCV 85.8 08/05/2016   PLT 205 08/05/2016    BMET    Component Value Date/Time   NA 139 08/05/2016 0845   K 4.0 08/05/2016 0845   K 4.5 12/29/2013 1521   CL 106 08/05/2016 0845   CO2 27 08/05/2016 0845   GLUCOSE 89 08/05/2016 0845   BUN 22 (H) 08/05/2016 0845   CREATININE 1.28 (H) 08/05/2016 0845   CREATININE 1.15 06/10/2013 1605   CALCIUM 9.3 08/05/2016 0845   GFRNONAA >60 08/05/2016 0845   GFRAA >60 08/05/2016 0845   Estimated Creatinine Clearance: 68.9 mL/min (A) (by C-G formula based on SCr of 1.28 mg/dL (H)).  COAG Lab  Results  Component Value Date   INR 1.0 10/22/2006    Radiology No results found.  Assessment/Plan 1. PAD (peripheral artery disease) (HCC)  Recommend:  The patient has evidence of atherosclerosis of the lower extremities with claudication.  The patient does not voice lifestyle limiting changes at this point in time.  Noninvasive studies do not suggest clinically significant change.  No invasive studies, angiography or surgery at this time The patient should continue walking and begin a more formal exercise program.  The patient should continue antiplatelet therapy and aggressive treatment of the lipid abnormalities  No changes in the patient's medications at this time  The patient should continue wearing graduated compression socks 10-15 mmHg strength to control the mild edema.    2. 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 Other osteomyelitis of left foot ([place]) See #2  4. Hyperlipidemia associated with type 2 diabetes mellitus ([place]) Continue hypoglycemic medications as already ordered, these medications have been reviewed and there are no changes at this time.  Hgb A1C to be monitored as already arranged by primary service  Continue statin as ordered and reviewed, no changes at this time     Hortencia Pilar, MD  08/07/2016 9:26 PM

## 2016-08-09 ENCOUNTER — Encounter (HOSPITAL_COMMUNITY)
Admission: RE | Admit: 2016-08-09 | Discharge: 2016-08-09 | Disposition: A | Discharge status: Home / Self Care | Payer: 59 | Source: Other Acute Inpatient Hospital | Attending: Infectious Diseases | Admitting: Infectious Diseases

## 2016-08-09 DIAGNOSIS — E11621 Type 2 diabetes mellitus with foot ulcer: Secondary | ICD-10-CM | POA: Diagnosis not present

## 2016-08-09 DIAGNOSIS — M86172 Other acute osteomyelitis, left ankle and foot: Secondary | ICD-10-CM | POA: Diagnosis not present

## 2016-08-09 DIAGNOSIS — E1169 Type 2 diabetes mellitus with other specified complication: Secondary | ICD-10-CM | POA: Diagnosis not present

## 2016-08-09 DIAGNOSIS — L97426 Non-pressure chronic ulcer of left heel and midfoot with bone involvement without evidence of necrosis: Secondary | ICD-10-CM | POA: Diagnosis not present

## 2016-08-09 LAB — VANCOMYCIN, TROUGH: Vancomycin Tr: 15 ug/mL (ref 15–20)

## 2016-08-11 DIAGNOSIS — L97414 Non-pressure chronic ulcer of right heel and midfoot with necrosis of bone: Secondary | ICD-10-CM | POA: Diagnosis not present

## 2016-08-11 DIAGNOSIS — E1142 Type 2 diabetes mellitus with diabetic polyneuropathy: Secondary | ICD-10-CM | POA: Diagnosis not present

## 2016-08-11 DIAGNOSIS — I10 Essential (primary) hypertension: Secondary | ICD-10-CM | POA: Diagnosis not present

## 2016-08-11 DIAGNOSIS — E08621 Diabetes mellitus due to underlying condition with foot ulcer: Secondary | ICD-10-CM | POA: Diagnosis not present

## 2016-08-12 ENCOUNTER — Other Ambulatory Visit (HOSPITAL_COMMUNITY)
Admission: RE | Admit: 2016-08-12 | Discharge: 2016-08-12 | Disposition: A | Discharge status: Home / Self Care | Payer: 59 | Source: Other Acute Inpatient Hospital | Attending: Podiatry | Admitting: Podiatry

## 2016-08-12 DIAGNOSIS — E1169 Type 2 diabetes mellitus with other specified complication: Secondary | ICD-10-CM | POA: Insufficient documentation

## 2016-08-12 DIAGNOSIS — E11621 Type 2 diabetes mellitus with foot ulcer: Secondary | ICD-10-CM | POA: Diagnosis not present

## 2016-08-12 DIAGNOSIS — M86172 Other acute osteomyelitis, left ankle and foot: Secondary | ICD-10-CM | POA: Diagnosis not present

## 2016-08-12 LAB — VANCOMYCIN, TROUGH: Vancomycin Tr: 14 ug/mL — ABNORMAL LOW (ref 15–20)

## 2016-08-13 DIAGNOSIS — E08621 Diabetes mellitus due to underlying condition with foot ulcer: Secondary | ICD-10-CM | POA: Diagnosis not present

## 2016-08-13 DIAGNOSIS — M86172 Other acute osteomyelitis, left ankle and foot: Secondary | ICD-10-CM | POA: Diagnosis not present

## 2016-08-13 DIAGNOSIS — L97414 Non-pressure chronic ulcer of right heel and midfoot with necrosis of bone: Secondary | ICD-10-CM | POA: Diagnosis not present

## 2016-08-13 DIAGNOSIS — A499 Bacterial infection, unspecified: Secondary | ICD-10-CM | POA: Diagnosis not present

## 2016-08-13 DIAGNOSIS — E1142 Type 2 diabetes mellitus with diabetic polyneuropathy: Secondary | ICD-10-CM | POA: Diagnosis not present

## 2016-08-13 DIAGNOSIS — L089 Local infection of the skin and subcutaneous tissue, unspecified: Secondary | ICD-10-CM | POA: Diagnosis not present

## 2016-08-14 DIAGNOSIS — E113513 Type 2 diabetes mellitus with proliferative diabetic retinopathy with macular edema, bilateral: Secondary | ICD-10-CM | POA: Diagnosis not present

## 2016-08-14 DIAGNOSIS — H4051X4 Glaucoma secondary to other eye disorders, right eye, indeterminate stage: Secondary | ICD-10-CM | POA: Diagnosis not present

## 2016-08-18 DIAGNOSIS — E08610 Diabetes mellitus due to underlying condition with diabetic neuropathic arthropathy: Secondary | ICD-10-CM | POA: Diagnosis not present

## 2016-08-18 DIAGNOSIS — L97414 Non-pressure chronic ulcer of right heel and midfoot with necrosis of bone: Secondary | ICD-10-CM | POA: Diagnosis not present

## 2016-08-18 DIAGNOSIS — E08621 Diabetes mellitus due to underlying condition with foot ulcer: Secondary | ICD-10-CM | POA: Diagnosis not present

## 2016-08-20 DIAGNOSIS — L089 Local infection of the skin and subcutaneous tissue, unspecified: Secondary | ICD-10-CM | POA: Diagnosis not present

## 2016-08-20 DIAGNOSIS — M86172 Other acute osteomyelitis, left ankle and foot: Secondary | ICD-10-CM | POA: Diagnosis not present

## 2016-08-21 ENCOUNTER — Other Ambulatory Visit (HOSPITAL_COMMUNITY)
Admission: RE | Admit: 2016-08-21 | Discharge: 2016-08-21 | Disposition: A | Discharge status: Home / Self Care | Payer: 59 | Source: Skilled Nursing Facility | Attending: Infectious Diseases | Admitting: Infectious Diseases

## 2016-08-21 DIAGNOSIS — E11621 Type 2 diabetes mellitus with foot ulcer: Secondary | ICD-10-CM | POA: Diagnosis not present

## 2016-08-21 DIAGNOSIS — Z7902 Long term (current) use of antithrombotics/antiplatelets: Secondary | ICD-10-CM | POA: Diagnosis not present

## 2016-08-21 DIAGNOSIS — M86172 Other acute osteomyelitis, left ankle and foot: Secondary | ICD-10-CM | POA: Diagnosis not present

## 2016-08-21 DIAGNOSIS — E1169 Type 2 diabetes mellitus with other specified complication: Secondary | ICD-10-CM | POA: Diagnosis not present

## 2016-08-21 DIAGNOSIS — Z5181 Encounter for therapeutic drug level monitoring: Secondary | ICD-10-CM | POA: Diagnosis present

## 2016-08-21 LAB — CBC WITH DIFFERENTIAL/PLATELET
Basophils Absolute: 0 10*3/uL (ref 0.0–0.1)
Basophils Relative: 1 %
Eosinophils Absolute: 0.4 10*3/uL (ref 0.0–0.7)
Eosinophils Relative: 6 %
HCT: 31.4 % — ABNORMAL LOW (ref 39.0–52.0)
Hemoglobin: 10.7 g/dL — ABNORMAL LOW (ref 13.0–17.0)
Lymphocytes Relative: 17 %
Lymphs Abs: 1.2 10*3/uL (ref 0.7–4.0)
MCH: 28.8 pg (ref 26.0–34.0)
MCHC: 34.1 g/dL (ref 30.0–36.0)
MCV: 84.4 fL (ref 78.0–100.0)
Monocytes Absolute: 0.4 10*3/uL (ref 0.1–1.0)
Monocytes Relative: 6 %
Neutro Abs: 4.8 10*3/uL (ref 1.7–7.7)
Neutrophils Relative %: 70 %
Platelets: 220 10*3/uL (ref 150–400)
RBC: 3.72 MIL/uL — ABNORMAL LOW (ref 4.22–5.81)
RDW: 14.1 % (ref 11.5–15.5)
WBC: 6.8 10*3/uL (ref 4.0–10.5)

## 2016-08-21 LAB — BASIC METABOLIC PANEL
Anion gap: 8 (ref 5–15)
BUN: 18 mg/dL (ref 6–20)
CO2: 28 mmol/L (ref 22–32)
Calcium: 9.5 mg/dL (ref 8.9–10.3)
Chloride: 105 mmol/L (ref 101–111)
Creatinine, Ser: 1.29 mg/dL — ABNORMAL HIGH (ref 0.61–1.24)
GFR calc Af Amer: >60 mL/min (ref >60)
GFR calc non Af Amer: >60 mL/min (ref >60)
Glucose, Bld: 116 mg/dL — ABNORMAL HIGH (ref 65–99)
Potassium: 4.2 mmol/L (ref 3.5–5.1)
Sodium: 141 mmol/L (ref 135–145)

## 2016-08-21 LAB — C-REACTIVE PROTEIN: CRP: <0.8 mg/dL (ref <1.0)

## 2016-08-21 LAB — SEDIMENTATION RATE: Sed Rate: 73 mm/hr — ABNORMAL HIGH (ref 0–16)

## 2016-08-21 LAB — VANCOMYCIN, TROUGH: Vancomycin Tr: 11 ug/mL — ABNORMAL LOW (ref 15–20)

## 2016-08-25 DIAGNOSIS — L97414 Non-pressure chronic ulcer of right heel and midfoot with necrosis of bone: Secondary | ICD-10-CM | POA: Diagnosis not present

## 2016-08-25 DIAGNOSIS — E08621 Diabetes mellitus due to underlying condition with foot ulcer: Secondary | ICD-10-CM | POA: Diagnosis not present

## 2016-08-25 DIAGNOSIS — E0842 Diabetes mellitus due to underlying condition with diabetic polyneuropathy: Secondary | ICD-10-CM | POA: Diagnosis not present

## 2016-08-25 DIAGNOSIS — E1142 Type 2 diabetes mellitus with diabetic polyneuropathy: Secondary | ICD-10-CM | POA: Diagnosis not present

## 2016-08-26 DIAGNOSIS — E11621 Type 2 diabetes mellitus with foot ulcer: Secondary | ICD-10-CM | POA: Diagnosis not present

## 2016-08-26 DIAGNOSIS — M86172 Other acute osteomyelitis, left ankle and foot: Secondary | ICD-10-CM | POA: Diagnosis not present

## 2016-08-26 DIAGNOSIS — E1169 Type 2 diabetes mellitus with other specified complication: Secondary | ICD-10-CM | POA: Diagnosis not present

## 2016-08-28 DIAGNOSIS — E113513 Type 2 diabetes mellitus with proliferative diabetic retinopathy with macular edema, bilateral: Secondary | ICD-10-CM | POA: Diagnosis not present

## 2016-08-29 ENCOUNTER — Encounter: Payer: Self-pay | Admitting: Family Medicine

## 2016-08-29 ENCOUNTER — Telehealth: Payer: Self-pay | Admitting: *Deleted

## 2016-08-29 ENCOUNTER — Ambulatory Visit (INDEPENDENT_AMBULATORY_CARE_PROVIDER_SITE_OTHER): Payer: 59 | Admitting: Family Medicine

## 2016-08-29 DIAGNOSIS — IMO0002 Reserved for concepts with insufficient information to code with codable children: Secondary | ICD-10-CM

## 2016-08-29 DIAGNOSIS — E1165 Type 2 diabetes mellitus with hyperglycemia: Secondary | ICD-10-CM | POA: Diagnosis not present

## 2016-08-29 DIAGNOSIS — Z794 Long term (current) use of insulin: Secondary | ICD-10-CM

## 2016-08-29 DIAGNOSIS — I10 Essential (primary) hypertension: Secondary | ICD-10-CM | POA: Diagnosis not present

## 2016-08-29 DIAGNOSIS — E1139 Type 2 diabetes mellitus with other diabetic ophthalmic complication: Secondary | ICD-10-CM

## 2016-08-29 DIAGNOSIS — M868X7 Other osteomyelitis, ankle and foot: Secondary | ICD-10-CM | POA: Diagnosis not present

## 2016-08-29 MED ORDER — CLOPIDOGREL BISULFATE 75 MG PO TABS: 75.0000 mg | ORAL_TABLET | Freq: Every day | ORAL | 3 refills | Status: DC

## 2016-08-29 MED ORDER — CLOPIDOGREL BISULFATE 75 MG PO TABS: 75.0000 mg | ORAL_TABLET | Freq: Every day | ORAL | 0 refills | Status: AC

## 2016-08-29 NOTE — Patient Instructions (Signed)
Nice to see you. Please continue to follow with wound care and infectious disease for your foot ulcer. Please monitor your tiredness and if this does not improve please let us know. Please continue to monitor your blood pressure. If it is consistently running greater than 140/90 please let us know. Please continue to monitor your blood sugars. You need to be sure you eat 3 meals a day. If your blood sugar drops low in the future and you pass out again you need to be evaluated. If you develop chest pain or trouble breathing please seek medical attention.

## 2016-08-29 NOTE — Assessment & Plan Note (Signed)
Quite a bit improved with regards to the wound. He is off of IV antibiotics. Currently on doxycycline. Encouraged continue follow-up with wound care. Suspect potential tiredness may be related to IV antibiotics and his lack of sleep. He'll monitor this and if not improving since coming off of IV antibiotics he will let us know.

## 2016-08-29 NOTE — Assessment & Plan Note (Signed)
Improved control compared to when we first met. He'll continue his current regimen. Sounds as though he has had a couple of hypoglycemic episodes one of which caused him to pass out. Notes prior to that episode he felt as though his sugar was low though he was unable to check it and it was 80 after eating. Had EKG through EMS that he reports they stated was normal. He'll see his cardiologist in follow-up in the next several weeks. Advised that he needs to eat 3 meals a day and not skip any meals or he will run the risk of this occurring again. If he has recurrent low blood sugars or hypoglycemic symptoms he will let us know. If he passes out again he will be evaluated. If he develops any other symptoms he will be evaluated. Given return precautions.

## 2016-08-29 NOTE — Assessment & Plan Note (Signed)
Above goal today though has been well controlled at other doctor's offices. Also well controlled at home. Discussed continuing to monitor at home and if above goal he should let us know.

## 2016-08-29 NOTE — Progress Notes (Signed)
[name], MD Phone: [phone]  Chase Clark is a 51 y.o. male who presents today for follow-up.  Diabetic foot ulcer: Had his PICC line removed earlier this week. No longer on IV antibiotics. Is following with wound care. The wound is looking improved. He is currently on doxycycline. He is walking some though not weightbearing on the heel.  Hypertension: Was 90/60 yesterday when he was checked. Typically in the 130s over 80s. Currently taking carvedilol, losartan, and HCTZ. No chest pain, shortness breath, or edema.  Diabetes: Fasting CBGs typically 130-160. Typically in the 120s rest of the day. No polydipsia or polyuria. Has had a couple of hypoglycemic episodes. Yesterday was 57. Several weeks ago he was at his stepfather's long-term care facility and started to sweat and feel buzzed. Felt as though his sugar was low. He was unable to check it prior to eating. He ate a banana though did not get to this quickly enough and ended up passing out. No palpitations or chest pain. Paramedics evaluated him and did an EKG which he reports was normal. When they checked his sugar after he came to after the banana was in him it was 80.  Patient does report overall he feels tired. States this is a sleepy feeling. Felt as though is related to the antibiotics he is getting through the IV. He doesn't note any depression or anxiety. No SI. He does not snore. He does not sleep well. Gets 4-5 hours of sleep a night.  PMH: Former smoker   ROS see history of present illness  Objective  Physical Exam Vitals:   08/29/16 1049  BP: (!) 164/100  Pulse: 84  Temp: 98.2 F (36.8 C)    BP Readings from Last 3 Encounters:  08/29/16 (!) 164/100  08/07/16 113/81  07/23/16 (!) 176/102   Wt Readings from Last 3 Encounters:  08/07/16 190 lb (86.2 kg)  07/09/16 190 lb 9.6 oz (86.5 kg)  06/21/16 196 lb (88.9 kg)    Physical Exam  Constitutional: No distress.  Cardiovascular: Normal rate, regular  rhythm and normal heart sounds.   Pulmonary/Chest: Effort normal and breath sounds normal.  Musculoskeletal: He exhibits no edema.  Left heel ulcer quite a bit improved with good granulation tissue, no bone exposed, no drainage, no tenderness of the edge, no erythema  Neurological: He is alert.  Skin: He is not diaphoretic.  Psychiatric: Mood and affect normal.     Assessment/Plan: Please see individual problem list.  Essential hypertension Above goal today though has been well controlled at other doctor's offices. Also well controlled at home. Discussed continuing to monitor at home and if above goal he should let us know.  DM (diabetes mellitus), type 2, uncontrolled w/ophthalmic complication Improved control compared to when we first met. He'll continue his current regimen. Sounds as though he has had a couple of hypoglycemic episodes one of which caused him to pass out. Notes prior to that episode he felt as though his sugar was low though he was unable to check it and it was 80 after eating. Had EKG through EMS that he reports they stated was normal. He'll see his cardiologist in follow-up in the next several weeks. Advised that he needs to eat 3 meals a day and not skip any meals or he will run the risk of this occurring again. If he has recurrent low blood sugars or hypoglycemic symptoms he will let us know. If he passes out again he will be evaluated. If he  develops any other symptoms he will be evaluated. Given return precautions.  Osteomyelitis ([place]) Quite a bit improved with regards to the wound. He is off of IV antibiotics. Currently on doxycycline. Encouraged continue follow-up with wound care. Suspect potential tiredness may be related to IV antibiotics and his lack of sleep. He'll monitor this and if not improving since coming off of IV antibiotics he will let us know.  [name], MD [place]

## 2016-08-29 NOTE — Telephone Encounter (Signed)
FYi Patient forgot to tell Dr. [name] that his Plavix script will need to written for a 90 day supply.

## 2016-08-29 NOTE — Telephone Encounter (Signed)
Sent to pharmacy 

## 2016-09-01 DIAGNOSIS — L97422 Non-pressure chronic ulcer of left heel and midfoot with fat layer exposed: Secondary | ICD-10-CM | POA: Insufficient documentation

## 2016-09-01 DIAGNOSIS — E11621 Type 2 diabetes mellitus with foot ulcer: Secondary | ICD-10-CM | POA: Insufficient documentation

## 2016-09-01 DIAGNOSIS — E1142 Type 2 diabetes mellitus with diabetic polyneuropathy: Secondary | ICD-10-CM | POA: Diagnosis not present

## 2016-09-02 DIAGNOSIS — E11621 Type 2 diabetes mellitus with foot ulcer: Secondary | ICD-10-CM | POA: Diagnosis not present

## 2016-09-02 DIAGNOSIS — E1169 Type 2 diabetes mellitus with other specified complication: Secondary | ICD-10-CM | POA: Diagnosis not present

## 2016-09-02 DIAGNOSIS — M86172 Other acute osteomyelitis, left ankle and foot: Secondary | ICD-10-CM | POA: Diagnosis not present

## 2016-09-10 DIAGNOSIS — E1169 Type 2 diabetes mellitus with other specified complication: Secondary | ICD-10-CM | POA: Diagnosis not present

## 2016-09-10 DIAGNOSIS — M869 Osteomyelitis, unspecified: Secondary | ICD-10-CM | POA: Diagnosis not present

## 2016-09-10 DIAGNOSIS — E11621 Type 2 diabetes mellitus with foot ulcer: Secondary | ICD-10-CM | POA: Diagnosis not present

## 2016-09-10 DIAGNOSIS — L97422 Non-pressure chronic ulcer of left heel and midfoot with fat layer exposed: Secondary | ICD-10-CM | POA: Diagnosis not present

## 2016-09-10 DIAGNOSIS — Z23 Encounter for immunization: Secondary | ICD-10-CM | POA: Diagnosis not present

## 2016-09-10 DIAGNOSIS — L97414 Non-pressure chronic ulcer of right heel and midfoot with necrosis of bone: Secondary | ICD-10-CM | POA: Diagnosis not present

## 2016-09-11 ENCOUNTER — Encounter: Payer: Self-pay | Admitting: Family Medicine

## 2016-09-11 ENCOUNTER — Other Ambulatory Visit: Payer: Self-pay | Admitting: Family Medicine

## 2016-09-11 DIAGNOSIS — E113513 Type 2 diabetes mellitus with proliferative diabetic retinopathy with macular edema, bilateral: Secondary | ICD-10-CM | POA: Diagnosis not present

## 2016-09-11 MED ORDER — CARVEDILOL 25 MG PO TABS: 25.0000 mg | ORAL_TABLET | Freq: Two times a day (BID) | ORAL | 3 refills | Status: DC

## 2016-09-11 NOTE — Telephone Encounter (Signed)
Last OV 08/29/16 last filled 07/31/16 30 0rf

## 2016-09-12 NOTE — Telephone Encounter (Signed)
faxed

## 2016-09-16 DIAGNOSIS — E11621 Type 2 diabetes mellitus with foot ulcer: Secondary | ICD-10-CM | POA: Diagnosis not present

## 2016-09-16 DIAGNOSIS — E1142 Type 2 diabetes mellitus with diabetic polyneuropathy: Secondary | ICD-10-CM | POA: Diagnosis not present

## 2016-09-16 DIAGNOSIS — E11622 Type 2 diabetes mellitus with other skin ulcer: Secondary | ICD-10-CM | POA: Diagnosis not present

## 2016-09-16 DIAGNOSIS — L97422 Non-pressure chronic ulcer of left heel and midfoot with fat layer exposed: Secondary | ICD-10-CM | POA: Diagnosis not present

## 2016-09-22 DIAGNOSIS — L97422 Non-pressure chronic ulcer of left heel and midfoot with fat layer exposed: Secondary | ICD-10-CM | POA: Diagnosis not present

## 2016-09-22 DIAGNOSIS — M869 Osteomyelitis, unspecified: Secondary | ICD-10-CM | POA: Diagnosis not present

## 2016-09-22 DIAGNOSIS — E1169 Type 2 diabetes mellitus with other specified complication: Secondary | ICD-10-CM | POA: Diagnosis not present

## 2016-09-22 DIAGNOSIS — E11621 Type 2 diabetes mellitus with foot ulcer: Secondary | ICD-10-CM | POA: Diagnosis not present

## 2016-09-24 ENCOUNTER — Other Ambulatory Visit: Payer: Self-pay

## 2016-09-24 MED ORDER — INSULIN ASPART 100 UNIT/ML ~~LOC~~ SOLN: 5.0000 [IU] | Freq: Three times a day (TID) | SUBCUTANEOUS | 1 refills | Status: DC

## 2016-09-24 NOTE — Telephone Encounter (Signed)
Sent to pharmacy 

## 2016-09-24 NOTE — Telephone Encounter (Signed)
Last OV 08/29/16 last filled 07/18/16 10 ml 1rf

## 2016-09-25 DIAGNOSIS — E113513 Type 2 diabetes mellitus with proliferative diabetic retinopathy with macular edema, bilateral: Secondary | ICD-10-CM | POA: Diagnosis not present

## 2016-09-30 DIAGNOSIS — L97422 Non-pressure chronic ulcer of left heel and midfoot with fat layer exposed: Secondary | ICD-10-CM | POA: Diagnosis not present

## 2016-09-30 DIAGNOSIS — E1142 Type 2 diabetes mellitus with diabetic polyneuropathy: Secondary | ICD-10-CM | POA: Diagnosis not present

## 2016-09-30 DIAGNOSIS — E11621 Type 2 diabetes mellitus with foot ulcer: Secondary | ICD-10-CM | POA: Diagnosis not present

## 2016-10-06 DIAGNOSIS — L97422 Non-pressure chronic ulcer of left heel and midfoot with fat layer exposed: Secondary | ICD-10-CM | POA: Diagnosis not present

## 2016-10-06 DIAGNOSIS — E11621 Type 2 diabetes mellitus with foot ulcer: Secondary | ICD-10-CM | POA: Diagnosis not present

## 2016-10-06 DIAGNOSIS — E1142 Type 2 diabetes mellitus with diabetic polyneuropathy: Secondary | ICD-10-CM | POA: Diagnosis not present

## 2016-10-06 DIAGNOSIS — L97414 Non-pressure chronic ulcer of right heel and midfoot with necrosis of bone: Secondary | ICD-10-CM | POA: Diagnosis not present

## 2016-10-08 DIAGNOSIS — I739 Peripheral vascular disease, unspecified: Secondary | ICD-10-CM | POA: Diagnosis not present

## 2016-10-08 DIAGNOSIS — I10 Essential (primary) hypertension: Secondary | ICD-10-CM | POA: Diagnosis not present

## 2016-10-08 DIAGNOSIS — I2510 Atherosclerotic heart disease of native coronary artery without angina pectoris: Secondary | ICD-10-CM | POA: Diagnosis not present

## 2016-10-10 ENCOUNTER — Ambulatory Visit (INDEPENDENT_AMBULATORY_CARE_PROVIDER_SITE_OTHER): Payer: 59 | Admitting: Family Medicine

## 2016-10-10 ENCOUNTER — Encounter: Payer: Self-pay | Admitting: Family Medicine

## 2016-10-10 VITALS — BP 172/92 | HR 81 | Temp 97.8°F | Wt 215.0 lb

## 2016-10-10 DIAGNOSIS — I10 Essential (primary) hypertension: Secondary | ICD-10-CM | POA: Diagnosis not present

## 2016-10-10 DIAGNOSIS — IMO0002 Reserved for concepts with insufficient information to code with codable children: Secondary | ICD-10-CM

## 2016-10-10 DIAGNOSIS — Z794 Long term (current) use of insulin: Secondary | ICD-10-CM

## 2016-10-10 DIAGNOSIS — E1165 Type 2 diabetes mellitus with hyperglycemia: Secondary | ICD-10-CM | POA: Diagnosis not present

## 2016-10-10 DIAGNOSIS — M868X7 Other osteomyelitis, ankle and foot: Secondary | ICD-10-CM | POA: Diagnosis not present

## 2016-10-10 DIAGNOSIS — E1139 Type 2 diabetes mellitus with other diabetic ophthalmic complication: Secondary | ICD-10-CM | POA: Diagnosis not present

## 2016-10-10 LAB — POCT GLYCOSYLATED HEMOGLOBIN (HGB A1C): Hemoglobin A1C: 7.3

## 2016-10-10 MED ORDER — INSULIN GLARGINE 100 UNIT/ML ~~LOC~~ SOLN: 45.0000 [IU] | Freq: Every day | SUBCUTANEOUS | 3 refills | Status: DC

## 2016-10-10 NOTE — Patient Instructions (Signed)
Nice to see you. Please continue to follow with wound care. If you need [place] to place a home health order please let us know. We are can increase your Lantus to 45 units daily. Please continue to monitor your blood pressure and if it is not consistently around 130/80 or less please let us know. Please work on diet and exercise.

## 2016-10-10 NOTE — Assessment & Plan Note (Signed)
Elevated today though has been quite well controlled at other physician's offices. Well-controlled at home as well. He'll continue his current regimen and continue to monitor. Discussed his goal blood pressure. We'll see what it is with dietary and exercise changes in 3 months.

## 2016-10-10 NOTE — Assessment & Plan Note (Addendum)
Blood glucoses have been somewhat better controlled from prior. A1c today improved from prior and is now near goal. He'll increase his Lantus to 45 units daily. He will continue his other diabetic regimen as is. He will start work on his diet.

## 2016-10-10 NOTE — Assessment & Plan Note (Addendum)
Continues to improve. Off antibiotics all together. He will continue to follow with wound care in [facility]. If needed we can order home health for wound care. He will let us know if this is needed if they're unable to sort this out at [facility].

## 2016-10-10 NOTE — Progress Notes (Signed)
  [name], MD Phone: [phone]  Chase Clark is a 51 y.o. male who presents today for f/u.  HYPERTENSION  Disease Monitoring  Home BP Monitoring 130s/80s-160/90 Chest pain- no    Dyspnea- no Medications  Compliance-  Taking carvedilol, losartan, HCTZ.  Edema- stable  DIABETES Disease Monitoring: Blood Sugar ranges-130-160 typically this several times over 200 Polyuria/phagia/dipsia- no      Visual problems- followed by ophthalmology for diabetic retinopathy saw them 2 weeks ago Medications: Compliance- taking NovoLog 5-15 units 3 times a day with meals. Also on Lantus 40 units typically though if elevated in the morning he'll take 45. Also on metformin. Hypoglycemic symptoms- no  Diabetic foot ulcer: No longer on antibiotics. He is following with wound center in [facility]. She's noted no fevers. Rarely gets some twinges in it. He notes it's healing quite well.   PMH: Former smoker   ROS see history of present illness  Objective  Physical Exam Vitals:   10/10/16 1007 10/10/16 1051  BP: (!) 180/100 (!) 172/92  Pulse: 81   Temp: 97.8 F (36.6 C)     BP Readings from Last 3 Encounters:  10/10/16 (!) 172/92  08/29/16 (!) 164/100  08/07/16 113/81   Wt Readings from Last 3 Encounters:  10/10/16 215 lb (97.5 kg)  08/07/16 190 lb (86.2 kg)  07/09/16 190 lb 9.6 oz (86.5 kg)    Physical Exam  Constitutional: No distress.  Cardiovascular: Normal rate, regular rhythm and normal heart sounds.   Pulmonary/Chest: Effort normal and breath sounds normal.  Musculoskeletal:  Left heel ulcer with good granulation tissue improved from prior, no drainage, no surrounding tenderness or erythema, there is edema in the left foot and slight edema in the right foot  Neurological: He is alert.  Skin: He is not diaphoretic.     Assessment/Plan: Please see individual problem list.  DM (diabetes mellitus), type 2, uncontrolled w/ophthalmic complication Blood glucoses have  been somewhat better controlled from prior. A1c today improved from prior and is now near goal. He'll increase his Lantus to 45 units daily. He will continue his other diabetic regimen as is. He will start work on his diet.  Osteomyelitis ([place]) Continues to improve. Off antibiotics all together. He will continue to follow with wound care in [facility]. If needed we can order home health for wound care. He will let us know if this is needed if they're unable to sort this out at [facility].  Essential hypertension Elevated today though has been quite well controlled at other physician's offices. Well-controlled at home as well. He'll continue his current regimen and continue to monitor. Discussed his goal blood pressure. We'll see what it is with dietary and exercise changes in 3 months.   Orders Placed This Encounter  Procedures  . POCT HgB A1C    [name], MD [place]

## 2016-10-13 ENCOUNTER — Encounter: Payer: Self-pay | Admitting: Family Medicine

## 2016-10-14 ENCOUNTER — Other Ambulatory Visit: Payer: Self-pay | Admitting: Family Medicine

## 2016-10-14 DIAGNOSIS — L97429 Non-pressure chronic ulcer of left heel and midfoot with unspecified severity: Principal | ICD-10-CM

## 2016-10-14 DIAGNOSIS — E11621 Type 2 diabetes mellitus with foot ulcer: Secondary | ICD-10-CM

## 2016-10-20 DIAGNOSIS — L97422 Non-pressure chronic ulcer of left heel and midfoot with fat layer exposed: Secondary | ICD-10-CM | POA: Diagnosis not present

## 2016-10-20 DIAGNOSIS — E113513 Type 2 diabetes mellitus with proliferative diabetic retinopathy with macular edema, bilateral: Secondary | ICD-10-CM | POA: Diagnosis not present

## 2016-10-20 DIAGNOSIS — E11621 Type 2 diabetes mellitus with foot ulcer: Secondary | ICD-10-CM | POA: Diagnosis not present

## 2016-10-20 DIAGNOSIS — L97414 Non-pressure chronic ulcer of right heel and midfoot with necrosis of bone: Secondary | ICD-10-CM | POA: Diagnosis not present

## 2016-10-20 DIAGNOSIS — E1142 Type 2 diabetes mellitus with diabetic polyneuropathy: Secondary | ICD-10-CM | POA: Diagnosis not present

## 2016-10-22 ENCOUNTER — Encounter: Payer: Self-pay | Admitting: Family Medicine

## 2016-10-23 ENCOUNTER — Other Ambulatory Visit: Payer: Self-pay | Admitting: Family Medicine

## 2016-10-23 DIAGNOSIS — Z13220 Encounter for screening for lipoid disorders: Secondary | ICD-10-CM

## 2016-10-28 DIAGNOSIS — L97422 Non-pressure chronic ulcer of left heel and midfoot with fat layer exposed: Secondary | ICD-10-CM | POA: Diagnosis not present

## 2016-10-28 DIAGNOSIS — E1142 Type 2 diabetes mellitus with diabetic polyneuropathy: Secondary | ICD-10-CM | POA: Diagnosis not present

## 2016-10-28 DIAGNOSIS — E11621 Type 2 diabetes mellitus with foot ulcer: Secondary | ICD-10-CM | POA: Diagnosis not present

## 2016-11-03 DIAGNOSIS — E08621 Diabetes mellitus due to underlying condition with foot ulcer: Secondary | ICD-10-CM | POA: Diagnosis not present

## 2016-11-03 DIAGNOSIS — L97422 Non-pressure chronic ulcer of left heel and midfoot with fat layer exposed: Secondary | ICD-10-CM | POA: Diagnosis not present

## 2016-11-03 DIAGNOSIS — E11621 Type 2 diabetes mellitus with foot ulcer: Secondary | ICD-10-CM | POA: Diagnosis not present

## 2016-11-10 DIAGNOSIS — E1142 Type 2 diabetes mellitus with diabetic polyneuropathy: Secondary | ICD-10-CM | POA: Diagnosis not present

## 2016-11-10 DIAGNOSIS — E11621 Type 2 diabetes mellitus with foot ulcer: Secondary | ICD-10-CM | POA: Diagnosis not present

## 2016-11-10 DIAGNOSIS — L97422 Non-pressure chronic ulcer of left heel and midfoot with fat layer exposed: Secondary | ICD-10-CM | POA: Diagnosis not present

## 2016-11-11 DIAGNOSIS — M5413 Radiculopathy, cervicothoracic region: Secondary | ICD-10-CM | POA: Diagnosis not present

## 2016-11-11 DIAGNOSIS — M5412 Radiculopathy, cervical region: Secondary | ICD-10-CM | POA: Diagnosis not present

## 2016-11-11 DIAGNOSIS — M25511 Pain in right shoulder: Secondary | ICD-10-CM | POA: Diagnosis not present

## 2016-11-12 DIAGNOSIS — M5412 Radiculopathy, cervical region: Secondary | ICD-10-CM | POA: Diagnosis not present

## 2016-11-12 DIAGNOSIS — M25511 Pain in right shoulder: Secondary | ICD-10-CM | POA: Diagnosis not present

## 2016-11-12 DIAGNOSIS — M5413 Radiculopathy, cervicothoracic region: Secondary | ICD-10-CM | POA: Diagnosis not present

## 2016-11-17 DIAGNOSIS — M5412 Radiculopathy, cervical region: Secondary | ICD-10-CM | POA: Diagnosis not present

## 2016-11-17 DIAGNOSIS — E1142 Type 2 diabetes mellitus with diabetic polyneuropathy: Secondary | ICD-10-CM | POA: Diagnosis not present

## 2016-11-17 DIAGNOSIS — L97414 Non-pressure chronic ulcer of right heel and midfoot with necrosis of bone: Secondary | ICD-10-CM | POA: Diagnosis not present

## 2016-11-17 DIAGNOSIS — M5413 Radiculopathy, cervicothoracic region: Secondary | ICD-10-CM | POA: Diagnosis not present

## 2016-11-17 DIAGNOSIS — E11621 Type 2 diabetes mellitus with foot ulcer: Secondary | ICD-10-CM | POA: Diagnosis not present

## 2016-11-17 DIAGNOSIS — M25511 Pain in right shoulder: Secondary | ICD-10-CM | POA: Diagnosis not present

## 2016-11-17 DIAGNOSIS — L97422 Non-pressure chronic ulcer of left heel and midfoot with fat layer exposed: Secondary | ICD-10-CM | POA: Diagnosis not present

## 2016-11-19 DIAGNOSIS — M5413 Radiculopathy, cervicothoracic region: Secondary | ICD-10-CM | POA: Diagnosis not present

## 2016-11-19 DIAGNOSIS — M5412 Radiculopathy, cervical region: Secondary | ICD-10-CM | POA: Diagnosis not present

## 2016-11-19 DIAGNOSIS — M25511 Pain in right shoulder: Secondary | ICD-10-CM | POA: Diagnosis not present

## 2016-11-21 DIAGNOSIS — M5412 Radiculopathy, cervical region: Secondary | ICD-10-CM | POA: Diagnosis not present

## 2016-11-21 DIAGNOSIS — M25511 Pain in right shoulder: Secondary | ICD-10-CM | POA: Diagnosis not present

## 2016-11-21 DIAGNOSIS — M5413 Radiculopathy, cervicothoracic region: Secondary | ICD-10-CM | POA: Diagnosis not present

## 2016-11-24 DIAGNOSIS — M5413 Radiculopathy, cervicothoracic region: Secondary | ICD-10-CM | POA: Diagnosis not present

## 2016-11-24 DIAGNOSIS — M5412 Radiculopathy, cervical region: Secondary | ICD-10-CM | POA: Diagnosis not present

## 2016-11-24 DIAGNOSIS — M25511 Pain in right shoulder: Secondary | ICD-10-CM | POA: Diagnosis not present

## 2016-11-26 DIAGNOSIS — M5412 Radiculopathy, cervical region: Secondary | ICD-10-CM | POA: Diagnosis not present

## 2016-11-26 DIAGNOSIS — M25511 Pain in right shoulder: Secondary | ICD-10-CM | POA: Diagnosis not present

## 2016-11-26 DIAGNOSIS — M5413 Radiculopathy, cervicothoracic region: Secondary | ICD-10-CM | POA: Diagnosis not present

## 2016-11-27 DIAGNOSIS — M5412 Radiculopathy, cervical region: Secondary | ICD-10-CM | POA: Diagnosis not present

## 2016-11-27 DIAGNOSIS — E11621 Type 2 diabetes mellitus with foot ulcer: Secondary | ICD-10-CM | POA: Diagnosis not present

## 2016-11-27 DIAGNOSIS — E1142 Type 2 diabetes mellitus with diabetic polyneuropathy: Secondary | ICD-10-CM | POA: Diagnosis not present

## 2016-11-27 DIAGNOSIS — M25511 Pain in right shoulder: Secondary | ICD-10-CM | POA: Diagnosis not present

## 2016-11-27 DIAGNOSIS — M5413 Radiculopathy, cervicothoracic region: Secondary | ICD-10-CM | POA: Diagnosis not present

## 2016-11-27 DIAGNOSIS — L97423 Non-pressure chronic ulcer of left heel and midfoot with necrosis of muscle: Secondary | ICD-10-CM | POA: Diagnosis not present

## 2016-11-27 DIAGNOSIS — E11610 Type 2 diabetes mellitus with diabetic neuropathic arthropathy: Secondary | ICD-10-CM | POA: Diagnosis not present

## 2016-12-01 DIAGNOSIS — E11610 Type 2 diabetes mellitus with diabetic neuropathic arthropathy: Secondary | ICD-10-CM | POA: Diagnosis not present

## 2016-12-01 DIAGNOSIS — L97423 Non-pressure chronic ulcer of left heel and midfoot with necrosis of muscle: Secondary | ICD-10-CM | POA: Diagnosis not present

## 2016-12-01 DIAGNOSIS — E11621 Type 2 diabetes mellitus with foot ulcer: Secondary | ICD-10-CM | POA: Diagnosis not present

## 2016-12-03 DIAGNOSIS — H2512 Age-related nuclear cataract, left eye: Secondary | ICD-10-CM | POA: Diagnosis not present

## 2016-12-03 DIAGNOSIS — E113513 Type 2 diabetes mellitus with proliferative diabetic retinopathy with macular edema, bilateral: Secondary | ICD-10-CM | POA: Diagnosis not present

## 2016-12-08 DIAGNOSIS — E11621 Type 2 diabetes mellitus with foot ulcer: Secondary | ICD-10-CM | POA: Diagnosis not present

## 2016-12-08 DIAGNOSIS — L97422 Non-pressure chronic ulcer of left heel and midfoot with fat layer exposed: Secondary | ICD-10-CM | POA: Diagnosis not present

## 2016-12-08 DIAGNOSIS — E1142 Type 2 diabetes mellitus with diabetic polyneuropathy: Secondary | ICD-10-CM | POA: Diagnosis not present

## 2016-12-16 DIAGNOSIS — L97422 Non-pressure chronic ulcer of left heel and midfoot with fat layer exposed: Secondary | ICD-10-CM | POA: Diagnosis not present

## 2016-12-22 DIAGNOSIS — E11621 Type 2 diabetes mellitus with foot ulcer: Secondary | ICD-10-CM | POA: Diagnosis not present

## 2016-12-22 DIAGNOSIS — E1142 Type 2 diabetes mellitus with diabetic polyneuropathy: Secondary | ICD-10-CM | POA: Diagnosis not present

## 2016-12-22 DIAGNOSIS — L97422 Non-pressure chronic ulcer of left heel and midfoot with fat layer exposed: Secondary | ICD-10-CM | POA: Diagnosis not present

## 2016-12-29 DIAGNOSIS — E11621 Type 2 diabetes mellitus with foot ulcer: Secondary | ICD-10-CM | POA: Diagnosis not present

## 2016-12-29 DIAGNOSIS — L97422 Non-pressure chronic ulcer of left heel and midfoot with fat layer exposed: Secondary | ICD-10-CM | POA: Diagnosis not present

## 2016-12-29 DIAGNOSIS — E1142 Type 2 diabetes mellitus with diabetic polyneuropathy: Secondary | ICD-10-CM | POA: Diagnosis not present

## 2016-12-30 ENCOUNTER — Other Ambulatory Visit: Payer: Self-pay

## 2016-12-30 NOTE — Telephone Encounter (Signed)
Last OV 10/10/16 last filled 09/11/16 60 3rf

## 2016-12-31 MED ORDER — CARVEDILOL 25 MG PO TABS: 25.0000 mg | ORAL_TABLET | Freq: Two times a day (BID) | ORAL | 3 refills | Status: DC

## 2017-01-01 DIAGNOSIS — H26491 Other secondary cataract, right eye: Secondary | ICD-10-CM | POA: Diagnosis not present

## 2017-01-04 ENCOUNTER — Encounter: Payer: Self-pay | Admitting: Family Medicine

## 2017-01-05 DIAGNOSIS — E11621 Type 2 diabetes mellitus with foot ulcer: Secondary | ICD-10-CM | POA: Diagnosis not present

## 2017-01-05 DIAGNOSIS — E1142 Type 2 diabetes mellitus with diabetic polyneuropathy: Secondary | ICD-10-CM | POA: Diagnosis not present

## 2017-01-05 DIAGNOSIS — L97422 Non-pressure chronic ulcer of left heel and midfoot with fat layer exposed: Secondary | ICD-10-CM | POA: Diagnosis not present

## 2017-01-07 NOTE — Telephone Encounter (Signed)
Error

## 2017-01-08 ENCOUNTER — Other Ambulatory Visit (INDEPENDENT_AMBULATORY_CARE_PROVIDER_SITE_OTHER): Payer: 59

## 2017-01-08 DIAGNOSIS — Z13220 Encounter for screening for lipoid disorders: Secondary | ICD-10-CM | POA: Diagnosis not present

## 2017-01-08 LAB — LIPID PANEL
Cholesterol: 234 mg/dL — ABNORMAL HIGH (ref 0–200)
HDL: 35.2 mg/dL — ABNORMAL LOW (ref >39.00)
Total CHOL/HDL Ratio: 7
Triglycerides: 650 mg/dL — ABNORMAL HIGH (ref 0.0–149.0)

## 2017-01-08 LAB — HEPATIC FUNCTION PANEL
ALT: 12 U/L (ref 0–53)
AST: 14 U/L (ref 0–37)
Albumin: 3.6 g/dL (ref 3.5–5.2)
Alkaline Phosphatase: 141 U/L — ABNORMAL HIGH (ref 39–117)
Bilirubin, Direct: 0.1 mg/dL (ref 0.0–0.3)
Total Bilirubin: 0.7 mg/dL (ref 0.2–1.2)
Total Protein: 7 g/dL (ref 6.0–8.3)

## 2017-01-08 LAB — LDL CHOLESTEROL, DIRECT: Direct LDL: 89 mg/dL

## 2017-01-09 ENCOUNTER — Other Ambulatory Visit: Payer: Self-pay | Admitting: Family Medicine

## 2017-01-09 MED ORDER — ROSUVASTATIN CALCIUM 10 MG PO TABS: 10.0000 mg | ORAL_TABLET | Freq: Every day | ORAL | 3 refills | Status: DC

## 2017-01-12 DIAGNOSIS — L97422 Non-pressure chronic ulcer of left heel and midfoot with fat layer exposed: Secondary | ICD-10-CM | POA: Diagnosis not present

## 2017-01-12 DIAGNOSIS — E1142 Type 2 diabetes mellitus with diabetic polyneuropathy: Secondary | ICD-10-CM | POA: Diagnosis not present

## 2017-01-12 DIAGNOSIS — E11621 Type 2 diabetes mellitus with foot ulcer: Secondary | ICD-10-CM | POA: Diagnosis not present

## 2017-01-12 NOTE — Telephone Encounter (Signed)
Error

## 2017-01-13 ENCOUNTER — Encounter: Payer: Self-pay | Admitting: Family Medicine

## 2017-01-13 ENCOUNTER — Ambulatory Visit (INDEPENDENT_AMBULATORY_CARE_PROVIDER_SITE_OTHER): Payer: 59 | Admitting: Family Medicine

## 2017-01-13 ENCOUNTER — Other Ambulatory Visit: Payer: Self-pay | Admitting: Family Medicine

## 2017-01-13 VITALS — BP 130/90 | HR 78 | Temp 97.7°F | Ht 70.0 in | Wt 200.6 lb

## 2017-01-13 DIAGNOSIS — E781 Pure hyperglyceridemia: Secondary | ICD-10-CM

## 2017-01-13 DIAGNOSIS — M5030 Other cervical disc degeneration, unspecified cervical region: Secondary | ICD-10-CM | POA: Diagnosis not present

## 2017-01-13 DIAGNOSIS — Z0000 Encounter for general adult medical examination without abnormal findings: Secondary | ICD-10-CM | POA: Diagnosis not present

## 2017-01-13 DIAGNOSIS — Z125 Encounter for screening for malignant neoplasm of prostate: Secondary | ICD-10-CM

## 2017-01-13 DIAGNOSIS — Z23 Encounter for immunization: Secondary | ICD-10-CM

## 2017-01-13 LAB — POCT GLYCOSYLATED HEMOGLOBIN (HGB A1C): Hemoglobin A1C: 14.5

## 2017-01-13 NOTE — Progress Notes (Signed)
[name], MD Phone: [phone]  Chase Clark is a 51 y.o. male who presents today for physical exam.  Is not really exercising due to limitations related to his. He is continuing to undergo evaluation with wound and orthopedics. Diet has not been great. He's been dealing with his father being in an assisted living facility. Reports prior hepatitis C and HIV testing. Pneumonia vaccine previously given as well. Colonoscopy up-to-date though we do not have the report. We'll request records. Flu shot today. Tetanus vaccination up-to-date. Quit smoking 9 years ago. Rare alcohol use. No illicit drug use.  Patient does report chronic cervical spine issues with DDD seen on prior x-rays. He does note occasional pain radiating down his arms. Notes the ulnar aspect of his lower arms are intermittently chronically numb. That is unchanged. Notes his neck we will spasm and be quite painful. Recent x-ray through chiropractor. He wonders about a referral for evaluation.  Active Ambulatory Problems    Diagnosis Date Noted  . DM (diabetes mellitus), type 2, uncontrolled w/ophthalmic complication ([place]) 38/18/4037  . Hyperlipidemia associated with type 2 diabetes mellitus ([place]) 07/17/2006  . GILBERT'S SYNDROME 07/17/2006  . Essential hypertension 07/17/2006  . Coronary atherosclerosis 07/17/2006  . OSTEOARTHRITIS 07/17/2006  . COUGH 07/17/2006  . Osteomyelitis ([place])   . Statin intolerance 06/10/2013  . 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 Obesity (BMI 30-39.9) 06/12/2013  . Pain in joint, shoulder region 06/12/2013  . Edema 06/28/2013  . Disorder of ligament of ankle 07/31/2013  . Anemia 01/12/2014  . DKA (diabetic ketoacidoses) ([place]) 05/17/2016  . Chronic diarrhea 05/17/2016  . Hyponatremia 05/17/2016  . PAD (peripheral artery disease) ([place]) 08/07/2016  . Ulcer of ankle ([place]) 08/07/2016  . Routine general medical examination at a health care facility 01/13/2017  . DDD  (degenerative disc disease), cervical 01/13/2017   Resolved Ambulatory Problems    Diagnosis Date Noted  . DM (diabetes mellitus) with complications ([place]) 54/36/0677  . Tachycardia 07/09/2016   Past Medical History:  Diagnosis Date  . Arthritis   . Asthma   . Coronary artery disease   . Diabetes mellitus without complication ([place])   . Edema, lower extremity   . Fatty liver   . GERD (gastroesophageal reflux disease)   . Glaucoma   . Heart disease 2011  . Hyperlipidemia   . Hypertension   . Neuropathy   . Seasonal allergies   . Shoulder pain   . Ulcer of foot ([place]) 08.06.2014    Family History  Problem Relation Age of Onset  . Hyperlipidemia Mother   . Diabetes Mother   . Hyperlipidemia Father   . Diabetes Father   . Heart disease Father   . Hypertension Father     Social History   Social History  . Marital status: Married    Spouse name: N/A  . Number of children: N/A  . Years of education: N/A   Occupational History  . Not on file.   Social History Main Topics  . Smoking status: Former Research scientist (life sciences)  . Smokeless tobacco: Never Used  . Alcohol use Yes  . Drug use: No  . Sexual activity: Not on file   Other Topics Concern  . Not on file   Social History Narrative  . No narrative on file    ROS  General:  Negative for nexplained weight loss, fever Skin: Negative for new or changing mole, sore that won't heal HEENT: Negative for trouble hearing, trouble seeing, ringing in ears, mouth  sores, hoarseness, change in voice, dysphagia. CV:  Negative for chest pain, dyspnea, edema, palpitations Resp: Negative for cough, dyspnea, hemoptysis GI: Negative for nausea, vomiting, diarrhea, constipation, abdominal pain, melena, hematochezia. GU: Negative for dysuria, incontinence, urinary hesitance, hematuria, vaginal or penile discharge, polyuria, sexual difficulty, lumps in testicle or breasts MSK: Positive for muscle cramps or aches, negative for joint pain or  swelling Neuro: Positive for numbness, Negative for headaches, weakness, dizziness, passing out/fainting Psych: Negative for depression, anxiety, memory problems  Objective  Physical Exam Vitals:   01/13/17 1053  BP: 130/90  Pulse: 78  Temp: 97.7 F (36.5 C)  SpO2: 97%    BP Readings from Last 3 Encounters:  01/13/17 130/90  10/10/16 (!) 172/92  08/29/16 (!) 164/100   Wt Readings from Last 3 Encounters:  01/13/17 200 lb 9.6 oz (91 kg)  10/10/16 215 lb (97.5 kg)  08/07/16 190 lb (86.2 kg)    Physical Exam  Constitutional: No distress.  HENT:  Head: Normocephalic and atraumatic.  Mouth/Throat: Oropharynx is clear and moist. No oropharyngeal exudate.  Eyes: Pupils are equal, round, and reactive to light. Conjunctivae are normal.  Cardiovascular: Normal rate, regular rhythm and normal heart sounds.   Pulmonary/Chest: Effort normal and breath sounds normal.  Abdominal: Soft. Bowel sounds are normal. He exhibits no distension. There is no tenderness. There is no rebound and no guarding.  Musculoskeletal: He exhibits no edema.  Wound on left heel continues to improve, no signs of infection  Neurological: He is alert.  5/5 strength bilateral biceps, triceps, and grip, sensation to light touch intact in bilateral upper extremities  Skin: Skin is warm and dry. He is not diaphoretic.  Psychiatric: Mood and affect normal.     Assessment/Plan:   Routine general medical examination at a health care facility Physical exam completed. Recent cholesterol panel with elevated triglycerides. Discussed diet and exercise changes. Start on Crestor. If he is intolerant of this he'll let us know. We'll request colonoscopy records.   DDD (degenerative disc disease), cervical Chronic neck issues related to DDD. Sounds as though there is some nerve impingement. Given intermittent numbness in the ulnar aspect of his lower arms. Neurologically intact in his upper extremities today. He will bring  his x-ray for [place] to look at. We'll refer to neurosurgery for evaluation. Flu shot given. Discussed Shingrix though patient wanted to defer this at this time.  Orders Placed This Encounter  Procedures  . Flu Vaccine QUAD 36+ mos IM  . Lipid panel    Standing Status:   Future    Standing Expiration Date:   01/13/2018  . Comp Met (CMET)    Standing Status:   Future    Standing Expiration Date:   01/13/2018  . Ambulatory referral to Neurosurgery    Referral Priority:   Routine    Referral Type:   Surgical    Referral Reason:   Specialty Services Required    Requested Specialty:   Neurosurgery    Number of Visits Requested:   1  . POCT HgB A1C    No orders of the defined types were placed in this encounter.    [name], MD [place]

## 2017-01-13 NOTE — Progress Notes (Signed)
Patient would like psa added

## 2017-01-13 NOTE — Patient Instructions (Signed)
Nice to see you. We will contact you with your A1c result. Please start on the Crestor. If you get muscle aches or other issues with the medication please let us know. Please try to eat healthier.

## 2017-01-13 NOTE — Assessment & Plan Note (Signed)
Physical exam completed. Recent cholesterol panel with elevated triglycerides. Discussed diet and exercise changes. Start on Crestor. If he is intolerant of this he'll let us know. We'll request colonoscopy records.

## 2017-01-13 NOTE — Assessment & Plan Note (Signed)
Chronic neck issues related to DDD. Sounds as though there is some nerve impingement. Given intermittent numbness in the ulnar aspect of his lower arms. Neurologically intact in his upper extremities today. He will bring his x-ray for [place] to look at. We'll refer to neurosurgery for evaluation.

## 2017-01-15 ENCOUNTER — Telehealth: Payer: Self-pay | Admitting: Family Medicine

## 2017-01-15 DIAGNOSIS — E113513 Type 2 diabetes mellitus with proliferative diabetic retinopathy with macular edema, bilateral: Secondary | ICD-10-CM | POA: Diagnosis not present

## 2017-01-15 NOTE — Telephone Encounter (Signed)
Pt called back returning your call. Please advise, thank you!  Call pt @ [phone]

## 2017-01-15 NOTE — Telephone Encounter (Signed)
Patients call returned in regards to lab results

## 2017-01-17 ENCOUNTER — Telehealth: Payer: Self-pay | Admitting: Family Medicine

## 2017-01-17 DIAGNOSIS — K2270 Barrett's esophagus without dysplasia: Secondary | ICD-10-CM

## 2017-01-17 NOTE — Telephone Encounter (Signed)
Please let the patient know we received his endoscopy and colonoscopy records. It appears that he had findings consistent with Barrett's esophagus. This should be followed up by GI. I can place a referral back to them as long as he is willing.

## 2017-01-19 NOTE — Telephone Encounter (Signed)
Referral placed.

## 2017-01-19 NOTE — Addendum Note (Signed)
Addended by: [name], ERIC G on: 01/19/2017 01:14 PM   Modules accepted: Orders

## 2017-01-19 NOTE — Telephone Encounter (Signed)
Patients wife notified and states he would like to see Dr.Perdel at [facility]

## 2017-01-26 DIAGNOSIS — L97422 Non-pressure chronic ulcer of left heel and midfoot with fat layer exposed: Secondary | ICD-10-CM | POA: Diagnosis not present

## 2017-01-26 DIAGNOSIS — E11621 Type 2 diabetes mellitus with foot ulcer: Secondary | ICD-10-CM | POA: Diagnosis not present

## 2017-01-29 DIAGNOSIS — M216X1 Other acquired deformities of right foot: Secondary | ICD-10-CM | POA: Diagnosis not present

## 2017-01-29 DIAGNOSIS — M216X2 Other acquired deformities of left foot: Secondary | ICD-10-CM | POA: Diagnosis not present

## 2017-01-29 DIAGNOSIS — E119 Type 2 diabetes mellitus without complications: Secondary | ICD-10-CM | POA: Diagnosis not present

## 2017-01-29 DIAGNOSIS — M84475S Pathological fracture, left foot, sequela: Secondary | ICD-10-CM | POA: Diagnosis not present

## 2017-01-30 DIAGNOSIS — H2512 Age-related nuclear cataract, left eye: Secondary | ICD-10-CM | POA: Diagnosis not present

## 2017-02-05 ENCOUNTER — Encounter: Payer: Self-pay | Admitting: *Deleted

## 2017-02-05 ENCOUNTER — Ambulatory Visit (INDEPENDENT_AMBULATORY_CARE_PROVIDER_SITE_OTHER): Payer: 59 | Admitting: Vascular Surgery

## 2017-02-05 ENCOUNTER — Encounter (INDEPENDENT_AMBULATORY_CARE_PROVIDER_SITE_OTHER): Payer: 59

## 2017-02-09 DIAGNOSIS — L97422 Non-pressure chronic ulcer of left heel and midfoot with fat layer exposed: Secondary | ICD-10-CM | POA: Diagnosis not present

## 2017-02-09 DIAGNOSIS — E11621 Type 2 diabetes mellitus with foot ulcer: Secondary | ICD-10-CM | POA: Diagnosis not present

## 2017-02-09 DIAGNOSIS — L97429 Non-pressure chronic ulcer of left heel and midfoot with unspecified severity: Secondary | ICD-10-CM | POA: Diagnosis not present

## 2017-02-11 ENCOUNTER — Other Ambulatory Visit: Payer: 59

## 2017-02-14 ENCOUNTER — Other Ambulatory Visit: Payer: Self-pay | Admitting: Family Medicine

## 2017-02-19 ENCOUNTER — Encounter: Payer: Self-pay | Admitting: Family Medicine

## 2017-02-20 ENCOUNTER — Telehealth: Payer: Self-pay | Admitting: Family Medicine

## 2017-02-20 NOTE — Telephone Encounter (Signed)
Copied from [place] [id number]. Topic: Quick Communication - See Telephone Encounter >> Feb 20, 2017  2:21 PM Boyd Kerbs wrote: CRM for notification. See Telephone encounter for: Dr Lestine Mount Neurologist  is who pt chose to go to . Please need referral  02/20/17.

## 2017-02-20 NOTE — Telephone Encounter (Signed)
Please advise 

## 2017-02-20 NOTE — Telephone Encounter (Signed)
Patient was previously referred to a neurosurgeon.  Dr. Tomi Likens is a neurologist.  I would suggest patient see a neurosurgeon for the issues that we discussed at his last visit.  Thanks.

## 2017-02-23 ENCOUNTER — Ambulatory Visit (INDEPENDENT_AMBULATORY_CARE_PROVIDER_SITE_OTHER): Payer: 59 | Admitting: Pharmacist

## 2017-02-23 ENCOUNTER — Encounter: Payer: Self-pay | Admitting: Family Medicine

## 2017-02-23 ENCOUNTER — Encounter: Payer: Self-pay | Admitting: Pharmacist

## 2017-02-23 DIAGNOSIS — IMO0002 Reserved for concepts with insufficient information to code with codable children: Secondary | ICD-10-CM

## 2017-02-23 DIAGNOSIS — E1169 Type 2 diabetes mellitus with other specified complication: Secondary | ICD-10-CM | POA: Diagnosis not present

## 2017-02-23 DIAGNOSIS — E785 Hyperlipidemia, unspecified: Secondary | ICD-10-CM

## 2017-02-23 DIAGNOSIS — E1139 Type 2 diabetes mellitus with other diabetic ophthalmic complication: Secondary | ICD-10-CM

## 2017-02-23 DIAGNOSIS — E1165 Type 2 diabetes mellitus with hyperglycemia: Secondary | ICD-10-CM

## 2017-02-23 MED ORDER — PEN NEEDLES 32G X 4 MM MISC: 90.0000 | Freq: Three times a day (TID) | 11 refills | Status: DC

## 2017-02-23 MED ORDER — INSULIN ASPART 100 UNIT/ML FLEXPEN: PEN_INJECTOR | SUBCUTANEOUS | 11 refills | Status: DC

## 2017-02-23 MED ORDER — ROSUVASTATIN CALCIUM 20 MG PO TABS: 20.0000 mg | ORAL_TABLET | Freq: Every day | ORAL | 11 refills | Status: DC

## 2017-02-23 NOTE — Telephone Encounter (Signed)
Pt was referred to [facility] Dr. Hilarie Fredrickson. I do not know how [facility] contacted him. It looks like they have been in contact with him

## 2017-02-23 NOTE — Patient Instructions (Addendum)
Nice to meet you!   1. Increase your novolog to 10 units with breakfast, 10-15 units with lunch, and 15-20 units with supper. I have sent in a prescription for the pens and pen needles. You can use the pens for lunch and use up the rest of your vial insulin with breakfast and supper before switching everything to the pens.   2. Keep your lantus dose the same for now, 50 units once a day.   3. Test your blood sugar first thing in the morning and ~ 2 hours after meals.   4. Increase your crestor (rosuvastatin) to 20 mg once a day. You can take 2 of the 10 mg pills at the same time each day until those are gone.   5. Take your blood pressure at home for the next couple of weeks and write these numbers down. We will consider changing blood pressure meds then, for now we will leave the same because you have been higher in recent weeks.   Call me if you have any issues. Come back to clinic in 2-3 weeks.

## 2017-02-23 NOTE — Assessment & Plan Note (Signed)
#  Diabetes longstanding currently uncontrolled. Patient denies hypoglycemic events and is able to verbalize appropriate hypoglycemia management plan. Patient denies adherence with medication. Control is suboptimal due to medication nonadherence, dietary indiscretion, sedentary lifestyle. Following discussion and approval by Dr. Derrel Nip, the following medication changes were made:  - Change novolog to pens so he can carry with him more easily while away from home. Change dose to 10 units with breakfast, 10-15 units with lunch, and 15-20 units with supper - Continue Lantus at 50 units daily - Reviewed dietary recommendations and exercise recommendations - Next A1C anticipated 04/15/2016  #HLD - TG 650 mg/dL on last labs which then throws off all other calculations of lipids in results. Additionally, exceedingly high A1C can cause TG to be increased.  - See above, increased rosuvastatin.  - Consider initiation of fish oil 2-4 g daily at next visit - F/u lipid panel in ~6 weeks

## 2017-02-23 NOTE — Telephone Encounter (Signed)
Patient states he is ok with the neurosurgery referral. Referral already placed.

## 2017-02-23 NOTE — Assessment & Plan Note (Signed)
#  Hypertension longstanding currently well controlled.  Patient reports adherence with medication. Concern for hypotension leading to dizziness, however pt has had recent BPs recorded in office that are much higher but still controlled. - Consider decreasing meds at next visit - Asked pt to test BP at home and write down numbers, bring in to next visit.   #ASCVD risk - patient with clinical ASCVD - PAD, coronary atherosclerosis. LDL 01/08/2017 89 mg/dL on treatment.  - Continued Aspirin 81 mg  - Increased dose of rosuvastatin to 20 mg.

## 2017-02-23 NOTE — Progress Notes (Addendum)
S:     Chief Complaint  Patient presents with  . Medication Management    Diabetes    Patient arrives in good spirits, ambulating without assistance.  Presents for diabetes and HLD evaluation, education, and management at the request of Dr. [name] after A1C was found to be uncontrolled at 14.5 and TG were found to be elevated (referred on 01/17/17, lab note).  At that time, insulin was increased to Lantus 50 daily, Novolog 10 TID with meals, and crestor 10 mg daily was added.   Today, patient complains of vertigo. Denies syncope or falls. This has been happening for "a long time". Endorses slow healing wound on left foot but denies s/sx of infection. Doesn't take insulin with lunch because he is on the go. Started crestor, feelings sluggish in general but no reported myalgias.   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  Current diabetes medications include: Lantus 45-50 units daily, novolog 5-10 units TID, metformin 1000 mg BID Current hypertension medications include: losartan-HCTZ 100-25 daily, carvedilol 25 mg BID  Patient denies hypoglycemic events.  Patient reported dietary habits: Eats 3 meals/day Breakfast: didn't eat because feeling ill, normally has pack of crackers or fruit or protein shake  Lunch: ham sandwich Dinner: frozen pizza  Snacks: none Drinks: diet coke, G2, water  Patient reported exercise habits: none limited by foot pain   Patient reports nocturia. x1 nightly,  Patient denies pain/burning on urination.  Patient reports neuropathy. Patient reports visual changes. 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   O:  Physical Exam  Constitutional: He  appears well-developed and well-nourished.  Vitals reviewed.    Review of Systems  Constitutional: Negative for chills and fever.  Musculoskeletal: Positive for neck pain. Negative for falls and myalgias.  Neurological: Positive for dizziness.  All other systems reviewed and are negative.    Lab Results  Component Value Date   HGBA1C 14.5 01/13/2017   Vitals:   02/23/17 1401 02/23/17 1435  BP: 91/63 96/66  Pulse:  70    Home fasting CBG: 200-250 2 hour post-prandial/random CBG: doesn't check usually   A/P: #Diabetes longstanding currently uncontrolled. Patient denies hypoglycemic events and is able to verbalize appropriate hypoglycemia management plan. Patient denies adherence with medication. Control is suboptimal due to medication nonadherence, dietary indiscretion, sedentary lifestyle. Following discussion and approval by Dr. Derrel Nip, the following medication changes were made:  - Change novolog to pens so he can carry with him more easily while away from home. Change dose to 10 units with breakfast, 10-15 units with lunch, and 15-20 units with supper - Continue Lantus at 50 units daily - Reviewed dietary recommendations and exercise recommendations - Next A1C anticipated 04/15/2016  #ASCVD risk - patient with clinical ASCVD - PAD, coronary atherosclerosis. LDL 01/08/2017 89 mg/dL on treatment.  - Continued Aspirin 81 mg  - Increased dose of rosuvastatin to 20 mg.   #HLD - TG 650 mg/dL on last labs which then throws off all other calculations of lipids in results. Additionally, exceedingly high A1C can cause TG to be increased.  - See above, increased rosuvastatin.  - Consider initiation of fish oil 2-4 g daily at next visit - F/u lipid panel in ~6 weeks  #  Hypertension longstanding currently well controlled.  Patient reports adherence with medication. Concern for hypotension leading to dizziness, however pt has had recent BPs recorded in office that are much higher but still  controlled. - Consider decreasing meds at next visit - Asked pt to test BP at home and write down numbers, bring in to next visit.   Patient was seen with Dr. Derrel Nip today in clinic and medication changes were discussed and approved prior to initiation.Written patient instructions provided.  Total time in face to face counseling 50 minutes.    Follow up in Pharmacist Clinic Visit 2-3 weeks.  Carlean Jews, Pharm.D. PGY2 Ambulatory Care Pharmacy Resident Phone: [phone]   I have reviewed the above information and agree with above.   Deborra Medina, MD

## 2017-02-24 ENCOUNTER — Telehealth: Payer: Self-pay

## 2017-02-24 ENCOUNTER — Other Ambulatory Visit: Payer: Self-pay | Admitting: Internal Medicine

## 2017-02-24 NOTE — Telephone Encounter (Signed)
Patients wife states she would like to hold off on referral until she gets more information, she will call or send mychart message once she is ready for patient to have referral.She would like for patient to be scheduled with [facility] for neurosurgery if possible.

## 2017-02-24 NOTE — Telephone Encounter (Signed)
This is a Adult nurse patient. Please call.    Copied from [place] [phone]. Topic: Inquiry >> Feb 24, 2017  8:30 AM [name] I, NT wrote: Reason for CRM: pt wife call to [facility] nurse  please call Lanetta Inch At [phone]

## 2017-02-25 ENCOUNTER — Telehealth: Payer: Self-pay

## 2017-02-25 ENCOUNTER — Encounter: Payer: Self-pay | Admitting: Internal Medicine

## 2017-02-25 NOTE — Telephone Encounter (Signed)
Pts wife asking for call back concerning  Neurosurgery referral

## 2017-02-25 NOTE — Telephone Encounter (Signed)
Patients wife notified referal has been placed and GI has received referral as well

## 2017-02-25 NOTE — Telephone Encounter (Signed)
FYI  Copied from [place] [phone]. Topic: Referral - Question >> Feb 25, 2017  2:23 PM Hewitt Shorts wrote: Reason for CRM: pt wife calling stating that she has gotten the name of the neurologist she would like the referral to be sent to   >> Feb 25, 2017  2:29 PM Lars Masson, LPN wrote: Left message to call back, please clarify name of neurologist and let me know who it is. >> Feb 25, 2017  3:30 PM Vernona Rieger wrote: Earle Gell is the name. [place] [phone]

## 2017-02-26 ENCOUNTER — Ambulatory Visit
Admission: RE | Admit: 2017-02-26 | Discharge: 2017-02-26 | Disposition: A | Discharge status: Home / Self Care | Payer: 59 | Source: Ambulatory Visit | Attending: Ophthalmology | Admitting: Ophthalmology

## 2017-02-26 ENCOUNTER — Encounter: Payer: Self-pay | Admitting: Emergency Medicine

## 2017-02-26 ENCOUNTER — Ambulatory Visit: Payer: 59 | Admitting: Certified Registered Nurse Anesthetist

## 2017-02-26 ENCOUNTER — Encounter
Admission: RE | Disposition: A | Discharge status: Home / Self Care | Payer: Self-pay | Source: Ambulatory Visit | Attending: Ophthalmology

## 2017-02-26 DIAGNOSIS — Z794 Long term (current) use of insulin: Secondary | ICD-10-CM | POA: Insufficient documentation

## 2017-02-26 DIAGNOSIS — K760 Fatty (change of) liver, not elsewhere classified: Secondary | ICD-10-CM | POA: Diagnosis not present

## 2017-02-26 DIAGNOSIS — H409 Unspecified glaucoma: Secondary | ICD-10-CM | POA: Insufficient documentation

## 2017-02-26 DIAGNOSIS — E785 Hyperlipidemia, unspecified: Secondary | ICD-10-CM | POA: Diagnosis not present

## 2017-02-26 DIAGNOSIS — Z9049 Acquired absence of other specified parts of digestive tract: Secondary | ICD-10-CM | POA: Insufficient documentation

## 2017-02-26 DIAGNOSIS — M1990 Unspecified osteoarthritis, unspecified site: Secondary | ICD-10-CM | POA: Diagnosis not present

## 2017-02-26 DIAGNOSIS — Z9849 Cataract extraction status, unspecified eye: Secondary | ICD-10-CM | POA: Insufficient documentation

## 2017-02-26 DIAGNOSIS — E1136 Type 2 diabetes mellitus with diabetic cataract: Secondary | ICD-10-CM | POA: Diagnosis not present

## 2017-02-26 DIAGNOSIS — K219 Gastro-esophageal reflux disease without esophagitis: Secondary | ICD-10-CM | POA: Diagnosis not present

## 2017-02-26 DIAGNOSIS — Z955 Presence of coronary angioplasty implant and graft: Secondary | ICD-10-CM | POA: Insufficient documentation

## 2017-02-26 DIAGNOSIS — Z87891 Personal history of nicotine dependence: Secondary | ICD-10-CM | POA: Insufficient documentation

## 2017-02-26 DIAGNOSIS — I2510 Atherosclerotic heart disease of native coronary artery without angina pectoris: Secondary | ICD-10-CM | POA: Diagnosis not present

## 2017-02-26 DIAGNOSIS — E1140 Type 2 diabetes mellitus with diabetic neuropathy, unspecified: Secondary | ICD-10-CM | POA: Insufficient documentation

## 2017-02-26 DIAGNOSIS — I10 Essential (primary) hypertension: Secondary | ICD-10-CM | POA: Diagnosis not present

## 2017-02-26 DIAGNOSIS — H2512 Age-related nuclear cataract, left eye: Secondary | ICD-10-CM | POA: Insufficient documentation

## 2017-02-26 HISTORY — PX: CATARACT EXTRACTION W/PHACO: SHX586

## 2017-02-26 LAB — GLUCOSE, CAPILLARY: Glucose-Capillary: 203 mg/dL — ABNORMAL HIGH (ref 65–99)

## 2017-02-26 SURGERY — PHACOEMULSIFICATION, CATARACT, WITH IOL INSERTION
Anesthesia: Monitor Anesthesia Care | Site: Eye | Laterality: Left | Wound class: Clean

## 2017-02-26 MED ORDER — MIDAZOLAM HCL 2 MG/2ML IJ SOLN
INTRAMUSCULAR | Status: DC | PRN
  Administered 2017-02-26: 2 mg via INTRAVENOUS

## 2017-02-26 MED ORDER — BSS IO SOLN
INTRAOCULAR | Status: DC | PRN
  Administered 2017-02-26: 1 via INTRAOCULAR

## 2017-02-26 MED ORDER — MOXIFLOXACIN HCL 0.5 % OP SOLN
OPHTHALMIC | Status: DC | PRN
  Administered 2017-02-26: 0.2 mL via OPHTHALMIC

## 2017-02-26 MED ORDER — SODIUM HYALURONATE 23 MG/ML IO SOLN
INTRAOCULAR | Status: DC | PRN
  Administered 2017-02-26: 0.6 mL via INTRAOCULAR

## 2017-02-26 MED ORDER — MOXIFLOXACIN HCL 0.5 % OP SOLN: 1.0000 [drp] | OPHTHALMIC | Status: DC | PRN

## 2017-02-26 MED ORDER — SODIUM HYALURONATE 23 MG/ML IO SOLN
INTRAOCULAR | Status: AC
  Filled 2017-02-26: qty 0.6

## 2017-02-26 MED ORDER — POVIDONE-IODINE 5 % OP SOLN
OPHTHALMIC | Status: AC
  Filled 2017-02-26: qty 30

## 2017-02-26 MED ORDER — ARMC OPHTHALMIC DILATING DROPS
OPHTHALMIC | Status: AC
  Administered 2017-02-26: 1 via OPHTHALMIC
  Filled 2017-02-26: qty 0.4

## 2017-02-26 MED ORDER — EPINEPHRINE PF 1 MG/ML IJ SOLN
INTRAMUSCULAR | Status: AC
  Filled 2017-02-26: qty 1

## 2017-02-26 MED ORDER — FENTANYL CITRATE (PF) 100 MCG/2ML IJ SOLN
INTRAMUSCULAR | Status: AC
  Filled 2017-02-26: qty 2

## 2017-02-26 MED ORDER — SODIUM CHLORIDE 0.9 % IV SOLN
INTRAVENOUS | Status: DC
  Administered 2017-02-26: 08:00:00 via INTRAVENOUS

## 2017-02-26 MED ORDER — POVIDONE-IODINE 5 % OP SOLN
OPHTHALMIC | Status: DC | PRN
  Administered 2017-02-26: 1 via OPHTHALMIC

## 2017-02-26 MED ORDER — MIDAZOLAM HCL 2 MG/2ML IJ SOLN
INTRAMUSCULAR | Status: AC
  Filled 2017-02-26: qty 2

## 2017-02-26 MED ORDER — FENTANYL CITRATE (PF) 100 MCG/2ML IJ SOLN
INTRAMUSCULAR | Status: DC | PRN
  Administered 2017-02-26: 25 ug via INTRAVENOUS
  Administered 2017-02-26: 50 ug via INTRAVENOUS

## 2017-02-26 MED ORDER — MOXIFLOXACIN HCL 0.5 % OP SOLN
OPHTHALMIC | Status: AC
  Filled 2017-02-26: qty 3

## 2017-02-26 MED ORDER — LIDOCAINE HCL (PF) 4 % IJ SOLN
INTRAMUSCULAR | Status: AC
  Filled 2017-02-26: qty 5

## 2017-02-26 MED ORDER — LIDOCAINE HCL (PF) 4 % IJ SOLN
INTRAMUSCULAR | Status: DC | PRN
  Administered 2017-02-26: 4 mL via OPHTHALMIC

## 2017-02-26 MED ORDER — ARMC OPHTHALMIC DILATING DROPS
1.0000 "application " | OPHTHALMIC | Status: AC
  Administered 2017-02-26 (×3): 1 via OPHTHALMIC

## 2017-02-26 MED ORDER — SODIUM HYALURONATE 10 MG/ML IO SOLN
INTRAOCULAR | Status: DC | PRN
  Administered 2017-02-26: 0.55 mL via INTRAOCULAR

## 2017-02-26 SURGICAL SUPPLY — 16 items
DISSECTOR HYDRO NUCLEUS 50X22 (MISCELLANEOUS) ×2 IMPLANT
GLOVE BIO SURGEON STRL SZ8 (GLOVE) ×2 IMPLANT
GLOVE BIOGEL M 6.5 STRL (GLOVE) ×2 IMPLANT
GLOVE SURG LX 7.5 STRW (GLOVE) ×1
GLOVE SURG LX STRL 7.5 STRW (GLOVE) ×1 IMPLANT
GOWN STRL REUS W/ TWL LRG LVL3 (GOWN DISPOSABLE) ×2 IMPLANT
GOWN STRL REUS W/TWL LRG LVL3 (GOWN DISPOSABLE) ×4
LABEL CATARACT MEDS ST (LABEL) ×2 IMPLANT
LENS IOL TECNIS ITEC 22.5 (Intraocular Lens) ×1 IMPLANT
PACK CATARACT (MISCELLANEOUS) ×2 IMPLANT
PACK CATARACT KING (MISCELLANEOUS) ×2 IMPLANT
PACK EYE AFTER SURG (MISCELLANEOUS) ×2 IMPLANT
SOL BSS BAG (MISCELLANEOUS) ×2
SOLUTION BSS BAG (MISCELLANEOUS) ×1 IMPLANT
WATER STERILE IRR 250ML POUR (IV SOLUTION) ×2 IMPLANT
WIPE NON LINTING 3.25X3.25 (MISCELLANEOUS) ×2 IMPLANT

## 2017-02-26 NOTE — Anesthesia Procedure Notes (Signed)
Procedure Name: MAC Performed by: Demetrius Charity, CRNA Pre-anesthesia Checklist: Patient identified, Emergency Drugs available, Suction available, Patient being monitored and Timeout performed Oxygen Delivery Method: Nasal cannula

## 2017-02-26 NOTE — H&P (Signed)
The History and Physical notes are on paper, have been signed, and are to be scanned.   I have examined the patient and there are no changes to the H&P.   Benay Pillow 02/26/2017 9:25 AM

## 2017-02-26 NOTE — Transfer of Care (Signed)
Immediate Anesthesia Transfer of Care Note  Patient: Chase Clark  Procedure(s) Performed: CATARACT EXTRACTION PHACO AND INTRAOCULAR LENS PLACEMENT (IOC) (Left Eye)  Patient Location: PACU  Anesthesia Type:MAC  Level of Consciousness: awake, alert  and oriented  Airway & Oxygen Therapy: Patient Spontanous Breathing  Post-op Assessment: Report given to RN and Post -op Vital signs reviewed and stable  Post vital signs: Reviewed and stable  Last Vitals:  Vitals:   02/26/17 0800  BP: 100/80  Pulse: 86  Resp: 18  Temp: 36.5 C  SpO2: 100%    Last Pain:  Vitals:   02/26/17 0800  TempSrc: Oral         Complications: No apparent anesthesia complications

## 2017-02-26 NOTE — Anesthesia Preprocedure Evaluation (Signed)
Anesthesia Evaluation  Patient identified by MRN, date of birth, ID band Patient awake    Reviewed: Allergy & Precautions, NPO status , Patient's Chart, lab work & pertinent test results  History of Anesthesia Complications Negative for: history of anesthetic complications  Airway Mallampati: II  TM Distance: >3 FB Neck ROM: Full    Dental  (+) Implants   Pulmonary asthma , neg sleep apnea, former smoker,    breath sounds clear to auscultation- rhonchi (-) wheezing      Cardiovascular hypertension, Pt. on medications + CAD and + Cardiac Stents  (-) Past MI and (-) CABG  Rhythm:Regular Rate:Normal - Systolic murmurs and - Diastolic murmurs    Neuro/Psych negative neurological ROS  negative psych ROS   GI/Hepatic Neg liver ROS, GERD  ,  Endo/Other  diabetes, Insulin Dependent  Renal/GU negative Renal ROS     Musculoskeletal  (+) Arthritis ,   Abdominal (+) - obese,   Peds  Hematology  (+) anemia ,   Anesthesia Other Findings Past Medical History: No date: Arthritis No date: Asthma     Comment:  as child No date: Coronary artery disease No date: Diabetes mellitus without complication (HCC) No date: Edema, lower extremity No date: Fatty liver No date: GERD (gastroesophageal reflux disease) No date: Glaucoma 2011: Heart disease     Comment:  Patient has stent for 80% blockage No date: Hyperlipidemia No date: Hypertension No date: Neuropathy No date: Seasonal allergies No date: Shoulder pain 08.06.2014: Ulcer of foot ([place])     Comment:  DIABETIC ULCERATIONS ASSOCIATED WITH IRRITATION LATERAL               ANKLE LEFT GREATER THAN RIGHT WITH MILD CELLULITIS   Reproductive/Obstetrics                             Anesthesia Physical Anesthesia Plan  ASA: III  Anesthesia Plan: MAC   Post-op Pain Management:    Induction: Intravenous  PONV Risk Score and Plan: 1 and  Midazolam  Airway Management Planned: Natural Airway  Additional Equipment:   Intra-op Plan:   Post-operative Plan:   Informed Consent: I have reviewed the patients History and Physical, chart, labs and discussed the procedure including the risks, benefits and alternatives for the proposed anesthesia with the patient or authorized representative who has indicated his/her understanding and acceptance.     Plan Discussed with: CRNA and Anesthesiologist  Anesthesia Plan Comments:         Anesthesia Quick Evaluation

## 2017-02-26 NOTE — Anesthesia Postprocedure Evaluation (Signed)
Anesthesia Post Note  Patient: Chase Clark  Procedure(s) Performed: CATARACT EXTRACTION PHACO AND INTRAOCULAR LENS PLACEMENT ([place]) (Left Eye)  Patient location during evaluation: PACU Anesthesia Type: MAC Level of consciousness: awake and alert and oriented Pain management: pain level controlled Vital Signs Assessment: post-procedure vital signs reviewed and stable Respiratory status: spontaneous breathing, nonlabored ventilation and respiratory function stable Cardiovascular status: blood pressure returned to baseline and stable Postop Assessment: no signs of nausea or vomiting Anesthetic complications: no     Last Vitals:  Vitals:   02/26/17 1003 02/26/17 1007  BP: (!) 142/93 (!) 142/94  Pulse: 85   Resp: 18   Temp: (!) 36.3 C   SpO2: 100%     Last Pain:  Vitals:   02/26/17 0800  TempSrc: Oral                 Amy Penwarden

## 2017-02-26 NOTE — Anesthesia Post-op Follow-up Note (Signed)
Anesthesia QCDR form completed.        

## 2017-02-26 NOTE — Discharge Instructions (Signed)
Eye Surgery Discharge Instructions  Expect mild scratchy sensation or mild soreness. DO NOT RUB YOUR EYE!  The day of surgery:  Minimal physical activity, but bed rest is not required  No reading, computer work, or close hand work  No bending, lifting, or straining.  May watch TV  For 24 hours:  No driving, legal decisions, or alcoholic beverages  Safety precautions  Eat anything you prefer: It is better to start with liquids, then soup then solid foods.  _____ Eye patch should be worn until postoperative exam tomorrow.  ____ Solar shield eyeglasses should be worn for comfort in the sunlight/patch while sleeping  Resume all regular medications including aspirin or Coumadin if these were discontinued prior to surgery. You may shower, bathe, shave, or wash your hair. Tylenol may be taken for mild discomfort.  Call your doctor if you experience significant pain, nausea, or vomiting, fever > 101 or other signs of infection. [phone] or [phone] Specific instructions:  Follow-up Information    Eulogio Bear, MD Follow up.   Specialty:  Ophthalmology Why:  November 16 at 9:00am in [facility] information: [address] [place] [address] [phone]

## 2017-02-26 NOTE — Op Note (Signed)
OPERATIVE NOTE  Chase Clark [id number] 02/26/2017   PREOPERATIVE DIAGNOSIS:  Nuclear sclerotic cataract left eye.  H25.12   POSTOPERATIVE DIAGNOSIS:    Nuclear sclerotic cataract left eye.     PROCEDURE:  Phacoemusification with posterior chamber intraocular lens placement of the left eye   LENS:   Implant Name Type Inv. Item Serial No. Manufacturer Lot No. LRB No. Used  LENS IOL DIOP 22.5 - [id number] Intraocular Lens LENS IOL DIOP 22.5 [id number] AMO  Left 1       PCB00 +22.5   ULTRASOUND TIME: 0 minutes 25 seconds.  CDE 1.59   SURGEON:  Benay Pillow, MD, MPH   ANESTHESIA:  Topical with tetracaine drops augmented with 1% preservative-free intracameral lidocaine.  ESTIMATED BLOOD LOSS: <1 mL   COMPLICATIONS:  None.   DESCRIPTION OF PROCEDURE:  The patient was identified in the holding room and transported to the operating room and placed in the supine position under the operating microscope.  The left eye was identified as the operative eye and it was prepped and draped in the usual sterile ophthalmic fashion.   A 1.0 millimeter clear-corneal paracentesis was made at the 5:00 position. 0.5 ml of preservative-free 1% lidocaine with epinephrine was injected into the anterior chamber.  The anterior chamber was filled with Healon 5 viscoelastic.  A 2.4 millimeter keratome was used to make a near-clear corneal incision at the 2:00 position.  A curvilinear capsulorrhexis was made with a cystotome and capsulorrhexis forceps.  Balanced salt solution was used to hydrodissect and hydrodelineate the nucleus.   Phacoemulsification was then used in stop and chop fashion to remove the lens nucleus and epinucleus.  The remaining cortex was then removed using the irrigation and aspiration handpiece. Healon was then placed into the capsular bag to distend it for lens placement.  A lens was then injected into the capsular bag.  The remaining viscoelastic was aspirated.  The lens was  relatively soft and prolapsed out of the bag at the time of hydrodissection,  Careful slow phacoaspiration, requiring minimal emulsification energy was employed.   Wounds were hydrated with balanced salt solution.  The anterior chamber was inflated to a physiologic pressure with balanced salt solution.  Intracameral vigamox 0.1 mL undiltued was injected into the eye and a drop placed onto the ocular surface. No wound leaks were noted.  The patient was taken to the recovery room in stable condition without complications of anesthesia or surgery  Benay Pillow 02/26/2017, 10:01 AM

## 2017-02-26 NOTE — Telephone Encounter (Signed)
Faxed to [place] [facility] for dr. Arnoldo Morale. They will review and contact pt to schedule.

## 2017-03-02 ENCOUNTER — Telehealth: Payer: Self-pay

## 2017-03-02 NOTE — Telephone Encounter (Signed)
Copied from [place]. Topic: Inquiry >> Mar 02, 2017  4:29 PM Oliver Pila B wrote: Reason for CRM: PT called and said the referral for the neurologist is good but they cant make an appt until the neurologist has an mri that has been completed by the pt, they are needing this; contact pt to set up

## 2017-03-03 ENCOUNTER — Other Ambulatory Visit: Payer: Self-pay | Admitting: Internal Medicine

## 2017-03-03 DIAGNOSIS — L97422 Non-pressure chronic ulcer of left heel and midfoot with fat layer exposed: Secondary | ICD-10-CM | POA: Diagnosis not present

## 2017-03-03 DIAGNOSIS — M5030 Other cervical disc degeneration, unspecified cervical region: Secondary | ICD-10-CM

## 2017-03-03 DIAGNOSIS — E1142 Type 2 diabetes mellitus with diabetic polyneuropathy: Secondary | ICD-10-CM | POA: Diagnosis not present

## 2017-03-03 DIAGNOSIS — E11621 Type 2 diabetes mellitus with foot ulcer: Secondary | ICD-10-CM | POA: Diagnosis not present

## 2017-03-03 NOTE — Telephone Encounter (Signed)
Pt has already been scheduled and aware of the appointment.

## 2017-03-03 NOTE — Telephone Encounter (Signed)
Please advise 

## 2017-03-03 NOTE — Telephone Encounter (Signed)
Looks like it has been ordered - need to make sure to get it scheduled.  Algis Greenhouse. Jerline Pain, MD 03/03/2017 2:56 PM

## 2017-03-07 ENCOUNTER — Ambulatory Visit (HOSPITAL_COMMUNITY)
Admission: RE | Admit: 2017-03-07 | Discharge: 2017-03-07 | Disposition: A | Discharge status: Home / Self Care | Payer: 59 | Source: Ambulatory Visit | Attending: Internal Medicine | Admitting: Internal Medicine

## 2017-03-07 DIAGNOSIS — M5030 Other cervical disc degeneration, unspecified cervical region: Secondary | ICD-10-CM

## 2017-03-07 DIAGNOSIS — M4802 Spinal stenosis, cervical region: Secondary | ICD-10-CM | POA: Insufficient documentation

## 2017-03-09 ENCOUNTER — Encounter: Payer: Self-pay | Admitting: Internal Medicine

## 2017-03-09 ENCOUNTER — Ambulatory Visit: Payer: 59 | Admitting: Pharmacist

## 2017-03-09 NOTE — Progress Notes (Deleted)
    S:     No chief complaint on file.   Patient arrives ***.  Presents for diabetes and HLD evaluation, education, and management at the request of Dr. [name]. Patient was referred on 01/17/2017 (lab note). Last Rx Clinic visit on 02/23/17 - at that time pt was found to be largely nonadherent to lunch time insulin so prandial insulin was changed to pens and was increased. Additionally, rosuvastatin was increased to high intensity.   Patient reports Diabetes was diagnosed in ***.   Family/Social History:   Insurance coverage/medication affordability: ***  Patient {Actions; denies-reports:120008} adherence with medications.  Current diabetes medications include: Current hypertension medications include:   Patient {Actions; denies-reports:120008} hypoglycemic events.  Patient reported dietary habits: Eats *** meals/day Breakfast:*** Lunch:*** Dinner:*** Snacks:*** Drinks:***  Patient reported exercise habits:    Patient {Actions; denies-reports:120008} nocturia.  Patient {Actions; denies-reports:120008} pain/burning on urination.  Patient {Actions; denies-reports:120008} neuropathy. Patient {Actions; denies-reports:120008} visual changes. Patient {Actions; denies-reports:120008} self foot exams. Of note, patient is following with a wound clinic for diabetic foot ulcer on L heel. Overall, improving.  Henderson Cloud:  Physical Exam   ROS   Lab Results  Component Value Date   HGBA1C 14.5 01/13/2017   There were no vitals filed for this visit. {Lipid panel:10928}  Home fasting CBG: ***  2 hour post-prandial/random CBG: ***.  10 year ASCVD risk: ***.  A/P: #Diabetes longstanding currently uncontrolled. Patient denies hypoglycemic events and is able to verbalize appropriate hypoglycemia management plan. Patient denies adherence with medication. Control is suboptimal due to medication nonadherence, dietary indiscretion, sedentary lifestyle. Following discussion  and approval by Dr. [name], the following medication changes were made:  - Change novolog to pens so he can carry with him more easily while away from home. Change dose to 10 units with breakfast, 10-15 units with lunch, and 15-20 units with supper - Continue Lantus at 50 units daily - Reviewed dietary recommendations and exercise recommendations - Next A1C anticipated 04/15/2016  #ASCVD risk - patient with clinical ASCVD - PAD, coronary atherosclerosis. LDL 01/08/2017 89 mg/dL on treatment.  - Continued Aspirin 81 mg  - Increased dose of rosuvastatin to 20 mg.   #HLD - TG 650 mg/dL on last labs which then throws off all other calculations of lipids in results. Additionally, exceedingly high A1C can cause TG to be increased.  - See above, increased rosuvastatin.  - Consider initiation of fish oil 2-4 g daily at next visit - F/u lipid panel in ~3 months  #Hypertension longstanding currently well controlled.  Patient reports adherence with medication. Concern for hypotension leading to dizziness, however pt has had recent BPs recorded in office that are much higher but still controlled. - Consider decreasing meds at next visit - Asked pt to test BP at home and write down numbers, bring in to next visit.   Patient was seen with Dr. Derrel Nip today in clinic and medication changes were discussed and approved prior to initiation.Written patient instructions provided.  Total time in face to face counseling 50 minutes.    Follow up in Pharmacist Clinic Visit 2-3 weeks.  Carlean Jews, Pharm.D. PGY2 Amb[facility] Pharmacy Resident Phone: [phone]

## 2017-03-10 ENCOUNTER — Encounter: Payer: Self-pay | Admitting: Family Medicine

## 2017-03-10 NOTE — Telephone Encounter (Signed)
It appears his MRI has been completed. Please make sure he has an appointment with neurosurgery. Thanks.

## 2017-03-17 DIAGNOSIS — L97422 Non-pressure chronic ulcer of left heel and midfoot with fat layer exposed: Secondary | ICD-10-CM | POA: Diagnosis not present

## 2017-03-17 DIAGNOSIS — E1142 Type 2 diabetes mellitus with diabetic polyneuropathy: Secondary | ICD-10-CM | POA: Diagnosis not present

## 2017-03-17 DIAGNOSIS — M4802 Spinal stenosis, cervical region: Secondary | ICD-10-CM | POA: Diagnosis not present

## 2017-03-17 DIAGNOSIS — M4712 Other spondylosis with myelopathy, cervical region: Secondary | ICD-10-CM | POA: Diagnosis not present

## 2017-03-17 DIAGNOSIS — M542 Cervicalgia: Secondary | ICD-10-CM | POA: Diagnosis not present

## 2017-03-17 DIAGNOSIS — E11621 Type 2 diabetes mellitus with foot ulcer: Secondary | ICD-10-CM | POA: Diagnosis not present

## 2017-03-17 DIAGNOSIS — L97522 Non-pressure chronic ulcer of other part of left foot with fat layer exposed: Secondary | ICD-10-CM | POA: Diagnosis not present

## 2017-03-23 ENCOUNTER — Ambulatory Visit: Payer: 59 | Admitting: Pharmacist

## 2017-03-24 DIAGNOSIS — E113513 Type 2 diabetes mellitus with proliferative diabetic retinopathy with macular edema, bilateral: Secondary | ICD-10-CM | POA: Diagnosis not present

## 2017-03-26 ENCOUNTER — Encounter: Payer: Self-pay | Admitting: Family Medicine

## 2017-03-26 NOTE — Telephone Encounter (Signed)
Dr Arnoldo Morale office is calling and faxed over surgical clearance/dosing of plavix yesterday, please advise [phone] ext [phone]

## 2017-03-26 NOTE — Telephone Encounter (Signed)
This needs to be placed in my red folder so I can review it to determine if the patient needs to be seen for clearance.  Thanks.

## 2017-03-27 ENCOUNTER — Telehealth: Payer: Self-pay | Admitting: Family Medicine

## 2017-03-27 ENCOUNTER — Encounter: Payer: Self-pay | Admitting: Family Medicine

## 2017-03-27 DIAGNOSIS — Z01818 Encounter for other preprocedural examination: Secondary | ICD-10-CM

## 2017-03-27 NOTE — Telephone Encounter (Signed)
I attempted to contact the patient on both numbers listed. There was no answer. I left a message to call back to the office.  Received a clearance form.  Based on his most recent visit with me I think he would be low risk with a gupta cardiac perioperative risk percentage of 0.3% and he would be at moderate risk medically given his uncontrolled diabetes and we can sign relating to that though I want to discuss his Plavix with him.  I need to confirm when he had his stent placed and confirm that he has been taking his Plavix.  He will need to be off of the Plavix for 5 days prior to the surgery and once we hear back from him we can fax the clearance form.  Please try contacting him on Monday.  It does appear his surgery is scheduled for the 19th. Mychart message sent to patient as well.

## 2017-03-30 ENCOUNTER — Other Ambulatory Visit: Payer: Self-pay | Admitting: Neurosurgery

## 2017-03-30 ENCOUNTER — Telehealth: Payer: Self-pay | Admitting: Family Medicine

## 2017-03-30 DIAGNOSIS — M4712 Other spondylosis with myelopathy, cervical region: Secondary | ICD-10-CM | POA: Diagnosis not present

## 2017-03-30 DIAGNOSIS — M4802 Spinal stenosis, cervical region: Secondary | ICD-10-CM | POA: Diagnosis not present

## 2017-03-30 DIAGNOSIS — G629 Polyneuropathy, unspecified: Secondary | ICD-10-CM | POA: Diagnosis not present

## 2017-03-30 NOTE — Telephone Encounter (Addendum)
Received my chart message. Patient will have been off of plavix 5 days prior to surgery. Form will be faxed. He will need a CBC and CMET prior to surgery. Please see if he can come in and have those done today or early tomorrow. Please also see if the patient had them send clearance to his cardiologist. You may need to check with the neurosurgery office as well. If they did not please contact the neurosurgery office advise that he needs to get an ok from his cardiologist as well. Thanks.

## 2017-03-30 NOTE — Telephone Encounter (Signed)
Patient states he does not have time to come in for labs, he states he does not understand why he needs to be cleared by cardiology since he has already discontinued the Plavix. I have called Dr.Fath's office and they have not received a clearance for patient, informed cardiology Dr.Sonnenberg would like patient to have clearance from cardiology.

## 2017-03-30 NOTE — Telephone Encounter (Signed)
Copied from [place] [id number]. Topic: Quick Communication - Office Called Patient >> Mar 30, 2017  1:14 PM Robina Ade, Helene Kelp D wrote: Reason for CRM: Patient wife called wanting to talk to Janett Billow if she can please call them back ,thanks. She has information that she needed.

## 2017-03-30 NOTE — Telephone Encounter (Signed)
Please advise 

## 2017-03-30 NOTE — Telephone Encounter (Signed)
Noted  

## 2017-03-30 NOTE — Telephone Encounter (Signed)
Patients wife states she called Dr.Fath and will wait to hear back from his office.

## 2017-03-30 NOTE — Telephone Encounter (Signed)
Patient has responded by mychart message sent to Dr.Sonneneberg and has been off of Plavix since 03/27/17

## 2017-03-30 NOTE — Addendum Note (Signed)
Addended by: [name], ERIC G on: 03/30/2017 01:03 PM   Modules accepted: Orders

## 2017-03-30 NOTE — Telephone Encounter (Signed)
Noted. I placed recommendations on the form that was faxed to neurosurgery to have CBC and renal function checked prior to surgery. They could arrange this prior to his surgery.

## 2017-03-31 DIAGNOSIS — E11610 Type 2 diabetes mellitus with diabetic neuropathic arthropathy: Secondary | ICD-10-CM | POA: Diagnosis not present

## 2017-03-31 DIAGNOSIS — E11621 Type 2 diabetes mellitus with foot ulcer: Secondary | ICD-10-CM | POA: Diagnosis not present

## 2017-03-31 DIAGNOSIS — E1142 Type 2 diabetes mellitus with diabetic polyneuropathy: Secondary | ICD-10-CM | POA: Diagnosis not present

## 2017-03-31 DIAGNOSIS — L97422 Non-pressure chronic ulcer of left heel and midfoot with fat layer exposed: Secondary | ICD-10-CM | POA: Diagnosis not present

## 2017-03-31 NOTE — Telephone Encounter (Addendum)
Most recent lab work to be faxed to neurosurgeons office. A1c was uncontrolled at 14.5. It also appears that the date of his surgery is now 04/16/17. I have asked Janett Billow to contact the neurosurgeons office to confirm this as the patient would need to go back on his plavix if that is the case.

## 2017-03-31 NOTE — Telephone Encounter (Signed)
Patient needs to have a follow up with Dr.Sonnenberg, placed slot on hold for patient on 04/01/17 230pm. Ok for pec to schedule

## 2017-03-31 NOTE — Telephone Encounter (Signed)
scheduled

## 2017-04-01 ENCOUNTER — Encounter: Payer: Self-pay | Admitting: Ophthalmology

## 2017-04-01 NOTE — Telephone Encounter (Signed)
Please try to schedule him next Thursday at 12 for follow-up of DM. Please also make sure he started back on the plavix after his surgery was moved. Thanks.

## 2017-04-02 NOTE — Telephone Encounter (Signed)
Tomorrow is fine

## 2017-04-02 NOTE — Telephone Encounter (Signed)
Patient is scheduled for tomorrow already is that ok?

## 2017-04-03 ENCOUNTER — Ambulatory Visit (INDEPENDENT_AMBULATORY_CARE_PROVIDER_SITE_OTHER): Payer: 59 | Admitting: Family Medicine

## 2017-04-03 ENCOUNTER — Encounter: Payer: Self-pay | Admitting: Family Medicine

## 2017-04-03 ENCOUNTER — Other Ambulatory Visit: Payer: Self-pay

## 2017-04-03 VITALS — BP 180/110 | HR 98 | Temp 97.9°F | Wt 221.2 lb

## 2017-04-03 DIAGNOSIS — E1139 Type 2 diabetes mellitus with other diabetic ophthalmic complication: Secondary | ICD-10-CM | POA: Diagnosis not present

## 2017-04-03 DIAGNOSIS — I739 Peripheral vascular disease, unspecified: Secondary | ICD-10-CM

## 2017-04-03 DIAGNOSIS — K760 Fatty (change of) liver, not elsewhere classified: Secondary | ICD-10-CM | POA: Diagnosis not present

## 2017-04-03 DIAGNOSIS — I10 Essential (primary) hypertension: Secondary | ICD-10-CM | POA: Diagnosis not present

## 2017-04-03 DIAGNOSIS — K529 Noninfective gastroenteritis and colitis, unspecified: Secondary | ICD-10-CM | POA: Diagnosis not present

## 2017-04-03 DIAGNOSIS — E1165 Type 2 diabetes mellitus with hyperglycemia: Secondary | ICD-10-CM | POA: Diagnosis not present

## 2017-04-03 DIAGNOSIS — IMO0002 Reserved for concepts with insufficient information to code with codable children: Secondary | ICD-10-CM

## 2017-04-03 DIAGNOSIS — I25118 Atherosclerotic heart disease of native coronary artery with other forms of angina pectoris: Secondary | ICD-10-CM | POA: Diagnosis not present

## 2017-04-03 LAB — COMPREHENSIVE METABOLIC PANEL
ALT: 12 U/L (ref 0–53)
AST: 19 U/L (ref 0–37)
Albumin: 3.6 g/dL (ref 3.5–5.2)
Alkaline Phosphatase: 79 U/L (ref 39–117)
BUN: 25 mg/dL — ABNORMAL HIGH (ref 6–23)
CO2: 25 mEq/L (ref 19–32)
Calcium: 9.7 mg/dL (ref 8.4–10.5)
Chloride: 102 mEq/L (ref 96–112)
Creatinine, Ser: 1.32 mg/dL (ref 0.40–1.50)
GFR: 60.15 mL/min (ref >60.00)
Glucose, Bld: 134 mg/dL — ABNORMAL HIGH (ref 70–99)
Potassium: 4.2 mEq/L (ref 3.5–5.1)
Sodium: 136 mEq/L (ref 135–145)
Total Bilirubin: 0.9 mg/dL (ref 0.2–1.2)
Total Protein: 7 g/dL (ref 6.0–8.3)

## 2017-04-03 MED ORDER — CARVEDILOL 25 MG PO TABS: 12.5000 mg | ORAL_TABLET | Freq: Two times a day (BID) | ORAL | 3 refills | Status: DC

## 2017-04-03 NOTE — Patient Instructions (Addendum)
Nice to see you. Please restart the carvedilol 12.5 mg twice daily with meals. Please discussed the Plavix with your cardiologist. We will check her blood sugar today. We can have you return for an A1c on 04/13/17. Please continue your other medication regimen for now. If you develop chest pain, shortness of breath, numbness, weakness, vision changes, or any new or changing symptoms please seek medical attention.

## 2017-04-03 NOTE — Progress Notes (Signed)
Chase Rumps, MD Phone: [phone]  Chase Clark is a 51 y.o. male who presents today for follow-up.  Hypertension: Typically around 130/80.  Was dropping low previously and he was getting quite lightheaded and they discontinued the carvedilol though they were only supposed to half the dose.  Was dropping down to 90/60.  He notes no chest pain or shortness of breath.  Has chronic edema that is intermittent and unchanged.  He notes neuropathy numbness though no new numbness.  No weakness.  No vision changes.  He does have PAD and reports he was on the Plavix related to that following a vascular procedure.  He notes that Plavix was not for the stent.  He does see his cardiologist later today and will discuss with them.  Diabetes: Typically between 130 and 160 fasting.  The highest is 200.  He is taking Lantus 50 units daily.  NovoLog 15-20 units with meals.  Taking metformin.  No polyuria or polydipsia.  No hypoglycemia.  He reports onset of diarrhea last night.  He ate something that typically leads to loose stools and him.  He had his gallbladder taken out previously and has had this issue intermittently since then.  No abdominal pain.  No vomiting no blood in his stool.  Social History   Tobacco Use  Smoking Status Former Smoker  Smokeless Tobacco Never Used     ROS see history of present illness  Objective  Physical Exam Vitals:   04/03/17 0902 04/03/17 0936  BP: (!) 200/110 (!) 180/110  Pulse: 98   Temp: 97.9 F (36.6 C)   SpO2: 98%     BP Readings from Last 3 Encounters:  04/03/17 (!) 180/110  02/26/17 (!) 142/94  02/23/17 96/66   Wt Readings from Last 3 Encounters:  04/03/17 221 lb 3.2 oz (100.3 kg)  03/07/17 199 lb (90.3 kg)  02/26/17 199 lb 9.6 oz (90.5 kg)    Physical Exam  Constitutional: No distress.  Cardiovascular: Normal rate, regular rhythm and normal heart sounds.  Pulmonary/Chest: Effort normal and breath sounds normal.  Abdominal: Soft.  Bowel sounds are normal. He exhibits no distension. There is no tenderness.  Musculoskeletal: He exhibits no edema.  Neurological: He is alert. Gait normal.  Skin: Skin is warm and dry. He is not diaphoretic.  Much improved ulcer on left heel with good granulation tissue noted, no signs of infection     Assessment/Plan: Please see individual problem list.  Essential hypertension Above goal today though has been previously overcontrolled.  We will have him resume 12.5 mg of carvedilol twice daily as he was supposed to.  Continue other medications.  He will continue to monitor.  PAD (peripheral artery disease) (HCC) Plavix is possibly for this.  Given his many vascular issues I think it would be reasonable to remain on Plavix.  He will discuss with his cardiologist further.  Chronic diarrhea Intermittent diarrhea since having his gallbladder removed.  I suspect this is related to this.  He will monitor further.  DM (diabetes mellitus), type 2, uncontrolled w/ophthalmic complication Improved CBGs.  He will continue his current regimen.  Plan for an A1c on 04/13/17.  Fatty liver Patient reports history of biopsy-proven fatty liver disease.  Also reports family history of hemochromatosis.  Will check liver function test.  Check iron studies.  Consider referral back to GI.   Chase Clark was seen today for follow-up.  Diagnoses and all orders for this visit:  Fatty liver -  Iron, TIBC and Ferritin Panel  Essential hypertension -     Comp Met (CMET)  PAD (peripheral artery disease) (HCC)  Chronic diarrhea  DM (diabetes mellitus), type 2, uncontrolled w/ophthalmic complication (HCC)  Other orders -     carvedilol (COREG) 25 MG tablet; Take 0.5 tablets (12.5 mg total) by mouth 2 (two) times daily with a meal.    Orders Placed This Encounter  Procedures  . Comp Met (CMET)  . Iron, TIBC and Ferritin Panel    Meds ordered this encounter  Medications  . carvedilol (COREG) 25 MG  tablet    Sig: Take 0.5 tablets (12.5 mg total) by mouth 2 (two) times daily with a meal.    Dispense:  90 tablet    Refill:  [place], MD [place]

## 2017-04-04 DIAGNOSIS — K760 Fatty (change of) liver, not elsewhere classified: Secondary | ICD-10-CM | POA: Insufficient documentation

## 2017-04-04 LAB — IRON,TIBC AND FERRITIN PANEL
%SAT: 20 % (calc) (ref 15–60)
Ferritin: 48 ng/mL (ref 20–380)
Iron: 65 ug/dL (ref 50–180)
TIBC: 318 mcg/dL (calc) (ref 250–425)

## 2017-04-04 NOTE — Assessment & Plan Note (Signed)
Plavix is possibly for this.  Given his many vascular issues I think it would be reasonable to remain on Plavix.  He will discuss with his cardiologist further.

## 2017-04-04 NOTE — Assessment & Plan Note (Addendum)
Above goal today though has been previously overcontrolled.  We will have him resume 12.5 mg of carvedilol twice daily as he was supposed to.  Continue other medications.  He will continue to monitor.

## 2017-04-04 NOTE — Assessment & Plan Note (Signed)
Patient reports history of biopsy-proven fatty liver disease.  Also reports family history of hemochromatosis.  Will check liver function test.  Check iron studies.  Consider referral back to GI.

## 2017-04-04 NOTE — Assessment & Plan Note (Signed)
Improved CBGs.  He will continue his current regimen.  Plan for an A1c on 04/13/17.

## 2017-04-04 NOTE — Assessment & Plan Note (Signed)
Intermittent diarrhea since having his gallbladder removed.  I suspect this is related to this.  He will monitor further.

## 2017-04-06 ENCOUNTER — Ambulatory Visit: Payer: 59 | Admitting: Pharmacist

## 2017-04-08 ENCOUNTER — Ambulatory Visit: Payer: Self-pay | Admitting: *Deleted

## 2017-04-08 ENCOUNTER — Telehealth: Payer: Self-pay | Admitting: Family Medicine

## 2017-04-08 NOTE — Telephone Encounter (Signed)
Opened in error

## 2017-04-08 NOTE — Telephone Encounter (Signed)
Pts wife returning call to office. States "Chase Clark" left message 0930 04/06/17 to call. Wife states she has seen all pts lab results and has appt tomorrow 12/27. Unable to advise as to nature of call from encounter. Note routed to office.

## 2017-04-08 NOTE — Telephone Encounter (Signed)
See result note.  

## 2017-04-09 DIAGNOSIS — I25118 Atherosclerotic heart disease of native coronary artery with other forms of angina pectoris: Secondary | ICD-10-CM | POA: Diagnosis not present

## 2017-04-09 NOTE — Progress Notes (Addendum)
KZL:[id number], [name], MD  Cardiologist: Dr. Bartholome Bill  EKG:07/09/2016 in [place] and 04/03/17-requested from [place] test: 04/09/17 -requested from [facility]  ECHO: cannot recall, several years ago-requested from [facility] clinic  Cardiac Cath: 2007  Chest x-ray: pt denies, no recent respiratory infection/complications  Pt was taking plavix following surgery on his foot 06/2016.  His Cardiologist took him off plavix last week and was not sure why it had not been discontinued earlier.  His PCP did not want to discontinue the medication without the cardiologists approval.  However, he is no longer on the medication and will not resume following surgery.  Pt did not take his morning dose of lantus insulin today by mistake.  His fasting blood glucose levels are usually in the 120s-130s.

## 2017-04-09 NOTE — Pre-Procedure Instructions (Signed)
RAHEIM BEUTLER  04/09/2017      [id number] Lorina Rabon, [place] [address] [place] [address] Phone: [phone] Fax: [phone]    Your procedure is scheduled on April 16, 2017.  Report to [facility] Admitting at 1230 PM.  Call this number if you have problems the morning of surgery:  [phone]   Remember:  Do not eat food or drink liquids after midnight.  Take these  medicines the morning of surgery with A SIP OF WATER carvedilol (coreg), acetaminophen (tylenol)-if needed, eye drops, esomeprazole (nexium), gabapentin (neurontin).   Stop plavix as instructed by your doctor.  7 days prior to surgery STOP taking any Aspirin (unless otherwise instructed by your surgeon), Aleve, Naproxen, Ibuprofen, Motrin, Advil, Goody's, BC's, all herbal medications, fish oil, and all vitamins  Continue all other medications as instructed by your physician except follow the above medication instructions before surgery  WHAT DO I DO ABOUT MY DIABETES MEDICATION?  Marland Kitchen Do not take oral diabetes medicines (pills) the morning of surgery.  . THE NIGHT BEFORE SURGERY, take your normal dose of novolog insulin with dinner.      . THE MORNING OF SURGERY, take 25 units of lantus insulin (1/2 of your normal dose) and no novolog insulin unless your CBG is greater than 220 mg/dL, you may take  of your sliding scale (correction) dose of insulin..  . The day of surgery, do not take other diabetes injectables, including Byetta (exenatide), Bydureon (exenatide ER), Victoza (liraglutide), or Trulicity (dulaglutide).  . If your CBG is greater than 220 mg/dL, you may take  of your sliding scale (correction) dose of insulin.  How to Manage Your Diabetes Before and After Surgery  Why is it important to control my blood sugar before and after surgery? . Improving blood sugar levels before and after surgery helps healing and can limit problems. . A way of  improving blood sugar control is eating a healthy diet by: o  Eating less sugar and carbohydrates o  Increasing activity/exercise o  Talking with your doctor about reaching your blood sugar goals . High blood sugars (greater than 180 mg/dL) can raise your risk of infections and slow your recovery, so you will need to focus on controlling your diabetes during the weeks before surgery. . Make sure that the doctor who takes care of your diabetes knows about your planned surgery including the date and location.  How do I manage my blood sugar before surgery? . Check your blood sugar at least 4 times a day, starting 2 days before surgery, to make sure that the level is not too high or low. o Check your blood sugar the morning of your surgery when you wake up and every 2 hours until you get to the Short Stay unit. . If your blood sugar is less than 70 mg/dL, you will need to treat for low blood sugar: o Do not take insulin. o Treat a low blood sugar (less than 70 mg/dL) with  cup of clear juice (cranberry or apple), 4 glucose tablets, OR glucose gel. Recheck blood sugar in 15 minutes after treatment (to make sure it is greater than 70 mg/dL). If your blood sugar is not greater than 70 mg/dL on recheck, call [phone] o  for further instructions. . Report your blood sugar to the short stay nurse when you get to Short Stay.  . If you are admitted to the hospital after surgery: o Your  blood sugar will be checked by the staff and you will probably be given insulin after surgery (instead of oral diabetes medicines) to make sure you have good blood sugar levels. o The goal for blood sugar control after surgery is 80-180 mg/dL.  Reviewed and Endorsed by [facility] Patient Education Committee, August 2015   Do not wear jewelry, make-up or nail polish.  Do not wear lotions, powders, or perfumes, or deodorant.  Men may shave face and neck.  Do not bring valuables to the hospital.  [facility] is not  responsible for any belongings or valuables.  Contacts, dentures or bridgework may not be worn into surgery.  Leave your suitcase in the car.  After surgery it may be brought to your room.  For patients admitted to the hospital, discharge time will be determined by your treatment team.  Patients discharged the day of surgery will not be allowed to drive home.   Special instructions:   [place]- Preparing For Surgery  Before surgery, you can play an important role. Because skin is not sterile, your skin needs to be as free of germs as possible. You can reduce the number of germs on your skin by washing with CHG (chlorahexidine gluconate) Soap before surgery.  CHG is an antiseptic cleaner which kills germs and bonds with the skin to continue killing germs even after washing.  Please do not use if you have an allergy to CHG or antibacterial soaps. If your skin becomes reddened/irritated stop using the CHG.  Do not shave (including legs and underarms) for at least 48 hours prior to first CHG shower. It is OK to shave your face.  Please follow these instructions carefully.   1. Shower the NIGHT BEFORE SURGERY and the MORNING OF SURGERY with CHG.   2. If you chose to wash your hair, wash your hair first as usual with your normal shampoo.  3. After you shampoo, rinse your hair and body thoroughly to remove the shampoo.  4. Use CHG as you would any other liquid soap. You can apply CHG directly to the skin and wash gently with a scrungie or a clean washcloth.   5. Apply the CHG Soap to your body ONLY FROM THE NECK DOWN.  Do not use on open wounds or open sores. Avoid contact with your eyes, ears, mouth and genitals (private parts). Wash Face and genitals (private parts)  with your normal soap.  6. Wash thoroughly, paying special attention to the area where your surgery will be performed.  7. Thoroughly rinse your body with warm water from the neck down.  8. DO NOT shower/wash with your  normal soap after using and rinsing off the CHG Soap.  9. Pat yourself dry with a CLEAN TOWEL.  10. Wear CLEAN PAJAMAS to bed the night before surgery, wear comfortable clothes the morning of surgery  11. Place CLEAN SHEETS on your bed the night of your first shower and DO NOT SLEEP WITH PETS.  Day of Surgery: Do not apply any deodorants/lotions. Please wear clean clothes to the hospital/surgery center.    Please read over the following fact sheets that you were given. Pain Booklet, Coughing and Deep Breathing, MRSA Information and Surgical Site Infection Prevention

## 2017-04-10 ENCOUNTER — Encounter (HOSPITAL_COMMUNITY)
Admission: RE | Admit: 2017-04-10 | Discharge: 2017-04-10 | Disposition: A | Discharge status: Home / Self Care | Payer: 59 | Source: Ambulatory Visit | Attending: Neurosurgery | Admitting: Neurosurgery

## 2017-04-10 ENCOUNTER — Ambulatory Visit (HOSPITAL_COMMUNITY): Payer: 59 | Admitting: Anesthesiology

## 2017-04-10 ENCOUNTER — Other Ambulatory Visit: Payer: Self-pay

## 2017-04-10 ENCOUNTER — Encounter (HOSPITAL_COMMUNITY): Payer: Self-pay

## 2017-04-10 DIAGNOSIS — M4802 Spinal stenosis, cervical region: Secondary | ICD-10-CM | POA: Diagnosis not present

## 2017-04-10 DIAGNOSIS — E1140 Type 2 diabetes mellitus with diabetic neuropathy, unspecified: Secondary | ICD-10-CM | POA: Diagnosis not present

## 2017-04-10 DIAGNOSIS — Z955 Presence of coronary angioplasty implant and graft: Secondary | ICD-10-CM | POA: Diagnosis not present

## 2017-04-10 DIAGNOSIS — J45909 Unspecified asthma, uncomplicated: Secondary | ICD-10-CM | POA: Diagnosis not present

## 2017-04-10 DIAGNOSIS — Z794 Long term (current) use of insulin: Secondary | ICD-10-CM | POA: Diagnosis not present

## 2017-04-10 DIAGNOSIS — K219 Gastro-esophageal reflux disease without esophagitis: Secondary | ICD-10-CM | POA: Diagnosis not present

## 2017-04-10 DIAGNOSIS — E785 Hyperlipidemia, unspecified: Secondary | ICD-10-CM | POA: Diagnosis not present

## 2017-04-10 DIAGNOSIS — I10 Essential (primary) hypertension: Secondary | ICD-10-CM | POA: Diagnosis not present

## 2017-04-10 DIAGNOSIS — Z87891 Personal history of nicotine dependence: Secondary | ICD-10-CM | POA: Diagnosis not present

## 2017-04-10 DIAGNOSIS — Z5309 Procedure and treatment not carried out because of other contraindication: Secondary | ICD-10-CM | POA: Diagnosis not present

## 2017-04-10 DIAGNOSIS — Z79899 Other long term (current) drug therapy: Secondary | ICD-10-CM | POA: Diagnosis not present

## 2017-04-10 DIAGNOSIS — Z7982 Long term (current) use of aspirin: Secondary | ICD-10-CM | POA: Diagnosis not present

## 2017-04-10 DIAGNOSIS — I2510 Atherosclerotic heart disease of native coronary artery without angina pectoris: Secondary | ICD-10-CM | POA: Diagnosis not present

## 2017-04-10 DIAGNOSIS — M5412 Radiculopathy, cervical region: Secondary | ICD-10-CM | POA: Diagnosis not present

## 2017-04-10 DIAGNOSIS — H409 Unspecified glaucoma: Secondary | ICD-10-CM | POA: Diagnosis not present

## 2017-04-10 LAB — SURGICAL PCR SCREEN
MRSA, PCR: NEGATIVE
Staphylococcus aureus: NEGATIVE

## 2017-04-10 LAB — TYPE AND SCREEN
ABO/RH(D): B POS
Antibody Screen: NEGATIVE

## 2017-04-10 LAB — CBC
HCT: 34.3 % — ABNORMAL LOW (ref 39.0–52.0)
Hemoglobin: 11.4 g/dL — ABNORMAL LOW (ref 13.0–17.0)
MCH: 28.8 pg (ref 26.0–34.0)
MCHC: 33.2 g/dL (ref 30.0–36.0)
MCV: 86.6 fL (ref 78.0–100.0)
Platelets: 236 10*3/uL (ref 150–400)
RBC: 3.96 MIL/uL — ABNORMAL LOW (ref 4.22–5.81)
RDW: 13.7 % (ref 11.5–15.5)
WBC: 7.9 10*3/uL (ref 4.0–10.5)

## 2017-04-10 LAB — GLUCOSE, CAPILLARY: Glucose-Capillary: 275 mg/dL — ABNORMAL HIGH (ref 65–99)

## 2017-04-10 LAB — PROTIME-INR
INR: 1.03
Prothrombin Time: 13.4 seconds (ref 11.4–15.2)

## 2017-04-10 LAB — HEMOGLOBIN A1C
Hgb A1c MFr Bld: 9.9 % — ABNORMAL HIGH (ref 4.8–5.6)
Mean Plasma Glucose: 237.43 mg/dL

## 2017-04-10 LAB — ABO/RH: ABO/RH(D): B POS

## 2017-04-10 NOTE — Progress Notes (Signed)
Dr. Roanna Banning, Anesthesia, reviewed pt stress test, EKG, nurses note and A1c result of 9.9. MD advised that I make surgeon aware. Spoke with Dr. Saintclair Halsted ( on call for Dr. Arnoldo Morale); no new orders given.

## 2017-04-13 ENCOUNTER — Ambulatory Visit (HOSPITAL_COMMUNITY)
Admission: RE | Admit: 2017-04-13 | Discharge: 2017-04-13 | Disposition: A | Discharge status: Home / Self Care | Payer: 59 | Source: Ambulatory Visit | Attending: Neurosurgery | Admitting: Neurosurgery

## 2017-04-13 ENCOUNTER — Other Ambulatory Visit: Payer: Self-pay | Admitting: Neurosurgery

## 2017-04-13 ENCOUNTER — Encounter (HOSPITAL_COMMUNITY)
Admission: RE | Disposition: A | Discharge status: Home / Self Care | Payer: Self-pay | Source: Ambulatory Visit | Attending: Neurosurgery

## 2017-04-13 ENCOUNTER — Other Ambulatory Visit: Payer: 59

## 2017-04-13 ENCOUNTER — Encounter (HOSPITAL_COMMUNITY): Payer: Self-pay | Admitting: Certified Registered Nurse Anesthetist

## 2017-04-13 DIAGNOSIS — I739 Peripheral vascular disease, unspecified: Secondary | ICD-10-CM | POA: Diagnosis not present

## 2017-04-13 DIAGNOSIS — Z87891 Personal history of nicotine dependence: Secondary | ICD-10-CM | POA: Insufficient documentation

## 2017-04-13 DIAGNOSIS — Z79899 Other long term (current) drug therapy: Secondary | ICD-10-CM | POA: Insufficient documentation

## 2017-04-13 DIAGNOSIS — Z955 Presence of coronary angioplasty implant and graft: Secondary | ICD-10-CM | POA: Insufficient documentation

## 2017-04-13 DIAGNOSIS — M4802 Spinal stenosis, cervical region: Secondary | ICD-10-CM | POA: Diagnosis not present

## 2017-04-13 DIAGNOSIS — Z5309 Procedure and treatment not carried out because of other contraindication: Secondary | ICD-10-CM | POA: Insufficient documentation

## 2017-04-13 DIAGNOSIS — I10 Essential (primary) hypertension: Secondary | ICD-10-CM | POA: Diagnosis not present

## 2017-04-13 DIAGNOSIS — Z794 Long term (current) use of insulin: Secondary | ICD-10-CM | POA: Insufficient documentation

## 2017-04-13 DIAGNOSIS — J45909 Unspecified asthma, uncomplicated: Secondary | ICD-10-CM | POA: Insufficient documentation

## 2017-04-13 DIAGNOSIS — H409 Unspecified glaucoma: Secondary | ICD-10-CM | POA: Insufficient documentation

## 2017-04-13 DIAGNOSIS — E1140 Type 2 diabetes mellitus with diabetic neuropathy, unspecified: Secondary | ICD-10-CM | POA: Insufficient documentation

## 2017-04-13 DIAGNOSIS — Z7982 Long term (current) use of aspirin: Secondary | ICD-10-CM | POA: Insufficient documentation

## 2017-04-13 DIAGNOSIS — E785 Hyperlipidemia, unspecified: Secondary | ICD-10-CM | POA: Insufficient documentation

## 2017-04-13 DIAGNOSIS — I2510 Atherosclerotic heart disease of native coronary artery without angina pectoris: Secondary | ICD-10-CM | POA: Diagnosis not present

## 2017-04-13 DIAGNOSIS — M5412 Radiculopathy, cervical region: Secondary | ICD-10-CM | POA: Insufficient documentation

## 2017-04-13 DIAGNOSIS — K219 Gastro-esophageal reflux disease without esophagitis: Secondary | ICD-10-CM | POA: Insufficient documentation

## 2017-04-13 LAB — GLUCOSE, CAPILLARY
Glucose-Capillary: 111 mg/dL — ABNORMAL HIGH (ref 65–99)
Glucose-Capillary: 123 mg/dL — ABNORMAL HIGH (ref 65–99)

## 2017-04-13 SURGERY — ANTERIOR CERVICAL DECOMPRESSION/DISCECTOMY FUSION 1 LEVEL
Anesthesia: General

## 2017-04-13 MED ORDER — LACTATED RINGERS IV SOLN
Freq: Once | INTRAVENOUS | Status: AC
  Administered 2017-04-13: 09:00:00 via INTRAVENOUS

## 2017-04-13 MED ORDER — FENTANYL CITRATE (PF) 250 MCG/5ML IJ SOLN
INTRAMUSCULAR | Status: AC
  Filled 2017-04-13: qty 5

## 2017-04-13 MED ORDER — PROPOFOL 10 MG/ML IV BOLUS
INTRAVENOUS | Status: AC
  Filled 2017-04-13: qty 40

## 2017-04-13 MED ORDER — CHLORHEXIDINE GLUCONATE CLOTH 2 % EX PADS: 6.0000 | MEDICATED_PAD | Freq: Once | CUTANEOUS | Status: DC

## 2017-04-13 MED ORDER — CEFAZOLIN SODIUM-DEXTROSE 2-4 GM/100ML-% IV SOLN
2.0000 g | INTRAVENOUS | Status: DC
  Filled 2017-04-13: qty 100

## 2017-04-13 MED ORDER — MIDAZOLAM HCL 2 MG/2ML IJ SOLN
INTRAMUSCULAR | Status: AC
  Filled 2017-04-13: qty 2

## 2017-04-13 NOTE — Progress Notes (Signed)
Patient ID: Chase Clark, male   DOB: July 23, 1965, 51 y.o.   MRN: [id number] The patient's blood pressure is very high.  Dr. Matthew Saras, anesthesia, recommends canceling the surgery.  He is going to go directly from here to his cardiologist's office to manage his blood pressure.  I discussed the situation with the patient and his wife and answered all their questions.  We will plan to reschedule surgery when his blood pressure is better controlled.

## 2017-04-13 NOTE — H&P (Signed)
Subjective: The patient is a 51 year old white male who has complained of neck and arm pain consistent with a cervical radiculopathy.  He has failed medical management and was worked up with a cervical MRI.  This demonstrated he had spondylosis and foraminal stenosis at C5-6.  I discussed the various treatment options with the patient including surgery.  He has weighed the risks, benefits, and alternatives of surgery and decided to proceed with a C5-6 anterior cervical discectomy, fusion and plating.  Past Medical History:  Diagnosis Date  . Arthritis   . Asthma    as child  . Coronary artery disease   . Diabetes mellitus without complication ([place])   . Edema, lower extremity   . Fatty liver   . GERD (gastroesophageal reflux disease)   . Glaucoma   . Heart disease 2011   Patient has stent for 80% blockage  . Hyperlipidemia   . Hypertension   . Neuropathy   . Seasonal allergies   . Shoulder pain   . Ulcer of foot ([place]) 08.06.2014   DIABETIC ULCERATIONS ASSOCIATED WITH IRRITATION LATERAL ANKLE LEFT GREATER THAN RIGHT WITH MILD CELLULITIS    Past Surgical History:  Procedure Laterality Date  . ABDOMINAL AORTOGRAM N/A 05/20/2016   Procedure: Abdominal Aortogram possible intervention;  Surgeon: Katha Cabal, MD;  Location: [facility];  Service: Cardiovascular;  Laterality: N/A;  . CARDIAC CATHETERIZATION    . CATARACT EXTRACTION W/ INTRAOCULAR LENS IMPLANT    . CATARACT EXTRACTION W/PHACO Left 02/26/2017   Procedure: CATARACT EXTRACTION PHACO AND INTRAOCULAR LENS PLACEMENT (IOC);  Surgeon: Eulogio Bear, MD;  Location: [facility];  Service: Ophthalmology;  Laterality: Left;  Lot # [id number] H [place]: 00:25.3 AP%: 6.2 CDE: 1.59   . CHOLECYSTECTOMY N/A   . CORONARY ANGIOPLASTY WITH STENT PLACEMENT  2007   1st diagonal  . EPIBLEPHERON REPAIR WITH TEAR DUCT PROBING    . EYE SURGERY Right sept 29n2015   cataract extraction  . INSERTION EXPRESS TUBE SHUNT Right 09/06/2015   Procedure: INSERTION AHMED TUBE SHUNT with tutoplast allograft;  Surgeon: Eulogio Bear, MD;  Location: [facility];  Service: Ophthalmology;  Laterality: Right;  Marland Kitchen VASECTOMY      Allergies  Allergen Reactions  . Atorvastatin Other (See Comments)    Myalgias     Social History   Tobacco Use  . Smoking status: Former Research scientist (life sciences)  . Smokeless tobacco: Never Used  Substance Use Topics  . Alcohol use: Yes    Comment: OCCAS    Family History  Problem Relation Age of Onset  . Hyperlipidemia Mother   . Diabetes Mother   . Hyperlipidemia Father   . Diabetes Father   . Heart disease Father   . Hypertension Father    Prior to Admission medications   Medication Sig Start Date End Date Taking? Authorizing Provider  acetaminophen (TYLENOL) 500 MG tablet Take 1,000 mg by mouth every 8 (eight) hours as needed for mild pain or headache.   Yes [provider]  aspirin EC 81 MG tablet Take 81 mg by mouth daily.   Yes [provider]  brimonidine-timolol (COMBIGAN) 0.2-0.5 % ophthalmic solution Place 1 drop into the right eye every 12 (twelve) hours.    Yes [provider]  carvedilol (COREG) 25 MG tablet Take 0.5 tablets (12.5 mg total) by mouth 2 (two) times daily with a meal. 04/03/17  Yes [name], MD  clopidogrel (PLAVIX) 75 MG tablet Take 75 mg by mouth daily.  Yes [provider]  DUREZOL 0.05 % EMUL APPLY 1 DROP(S) IN LEFT EYE 2 TIMES A DAY 03/27/17  Yes [provider]  esomeprazole (NEXIUM) 40 MG capsule Take 1 capsule (40 mg total) by mouth daily. Patient taking differently: Take 40 mg by mouth daily.  05/21/16  Yes Wieting, Richard, MD  furosemide (LASIX) 40 MG tablet Take 40 mg by mouth.   Yes [provider]  gabapentin (NEURONTIN) 300 MG capsule Take 2 capsules (600 mg total) by mouth 2 (two) times daily. 07/09/16  Yes [name], MD  insulin aspart (NOVOLOG FLEXPEN) 100 UNIT/ML FlexPen Inject 10 units with  breakfast, 10-15 units with lunch, and 15-20 units with supper Patient taking differently: Inject 15-20 Units into the skin 3 (three) times daily with meals. Per sliding scale 02/23/17  Yes [name], MD  insulin glargine (LANTUS) 100 UNIT/ML injection Inject 0.45 mLs (45 Units total) into the skin daily. Patient taking differently: Inject 50 Units into the skin daily. Takes in the morning 10/10/16  Yes [name], MD  latanoprost (XALATAN) 0.005 % ophthalmic solution Place 1 drop into both eyes at bedtime. 03/03/17  Yes [provider]  lisinopril (PRINIVIL,ZESTRIL) 40 MG tablet Take 40 mg by mouth daily.   Yes [provider]  losartan-hydrochlorothiazide (HYZAAR) 100-25 MG tablet Take 1 tablet by mouth daily. 07/15/15  Yes [provider]  metFORMIN (GLUCOPHAGE) 1000 MG tablet Take 1 tablet (1,000 mg total) by mouth 2 (two) times daily with a meal. 07/09/16  Yes [name], MD  Multiple Vitamin (MULTIVITAMIN WITH MINERALS) TABS tablet Take 1 tablet by mouth daily.   Yes [provider]  Probiotic Product (PROBIOTIC PO) Take 1 tablet by mouth daily.   Yes [provider]  Insulin Pen Needle (PEN NEEDLES) 32G X 4 MM MISC Inject 90 each 3 (three) times daily into the skin. 02/23/17   [name], MD  rosuvastatin (CRESTOR) 20 MG tablet Take 1 tablet (20 mg total) daily by mouth. 02/23/17   [name], MD     Review of Systems  Positive ROS: As above  All other systems have been reviewed and were otherwise negative with the exception of those mentioned in the HPI and as above.  Objective: Vital signs in last 24 hours: Temp:  [97.4 F (36.3 C)] 97.4 F (36.3 C) (12/31 0836) Pulse Rate:  [94] 94 (12/31 0836) Resp:  [20] 20 (12/31 0836) BP: (170)/(112) 170/112 (12/31 0836) SpO2:  [98 %] 98 % (12/31 0836) Weight:  [99.8 kg (220 lb)] 99.8 kg (220 lb) (12/31 0905)  General Appearance: Alert Head: Normocephalic, without  obvious abnormality, atraumatic Eyes: PERRL, conjunctiva/corneas clear, EOM's intact,    Ears: Normal  Throat: Normal  Neck: Supple, positive Spurling's testing. Back: unremarkable Lungs: Clear to auscultation bilaterally, respirations unlabored Heart: Regular rate and rhythm, no murmur, rub or gallop Abdomen: Soft, non-tender Extremities: Extremities normal, atraumatic, no cyanosis or edema Skin: unremarkable  NEUROLOGIC:   Mental status: alert and oriented,Motor Exam - grossly normal Sensory Exam -he has numbness in his upper extremity. Reflexes:  Coordination - grossly normal Gait - grossly normal Balance - grossly normal Cranial Nerves: I: smell Not tested  II: visual acuity  OS: Normal  OD: Normal   II: visual fields Full to confrontation  II: pupils Equal, round, reactive to light  III,VII: ptosis None  III,IV,VI: extraocular muscles  Full ROM  V: mastication Normal  V: facial light touch  sensation  Normal  V,VII: corneal reflex  Present  VII: facial muscle function - upper  Normal  VII: facial muscle function - lower Normal  VIII: hearing Not tested  IX: soft palate elevation  Normal  IX,X: gag reflex Present  XI: trapezius strength  5/5  XI: sternocleidomastoid strength 5/5  XI: neck flexion strength  5/5  XII: tongue strength  Normal    Data Review Lab Results  Component Value Date   WBC 7.9 04/10/2017   HGB 11.4 (L) 04/10/2017   HCT 34.3 (L) 04/10/2017   MCV 86.6 04/10/2017   PLT 236 04/10/2017   Lab Results  Component Value Date   NA 136 04/03/2017   K 4.2 04/03/2017   CL 102 04/03/2017   CO2 25 04/03/2017   BUN 25 (H) 04/03/2017   CREATININE 1.32 04/03/2017   GLUCOSE 134 (H) 04/03/2017   Lab Results  Component Value Date   INR 1.03 04/10/2017    Assessment/Plan: C5-6 spondylosis, stenosis, cervicalgia, cervical radiculopathy: I have discussed the situation with the patient.  I have reviewed his imaging studies with him and pointed out the  abnormalities.  We have discussed the various treatment options including surgery.  I have described the surgical treatment option of a C5-6 anterior cervical discectomy, fusion and plating.  I have shown him surgical models.  I have given him a surgical pamphlet.  We have discussed the risk, benefits, alternatives, expected postoperative course, and likelihood of achieving our goals with surgery.  I have answered all the patient's questions.  He has decided to proceed with surgery.   Ophelia Charter 04/13/2017 9:41 AM

## 2017-04-13 NOTE — Anesthesia Preprocedure Evaluation (Addendum)
Anesthesia Evaluation  Patient identified by MRN, date of birth, ID band Patient awake    Reviewed: Allergy & Precautions, NPO status , Patient's Chart, lab work & pertinent test results  Airway        Dental   Pulmonary asthma , former smoker,           Cardiovascular hypertension, Pt. on medications and Pt. on home beta blockers + CAD and + Peripheral Vascular Disease       Neuro/Psych negative neurological ROS  negative psych ROS   GI/Hepatic Neg liver ROS, GERD  Medicated,  Endo/Other  diabetes, Type 2, Insulin Dependent, Oral Hypoglycemic Agents  Renal/GU negative Renal ROS  negative genitourinary   Musculoskeletal  (+) Arthritis ,   Abdominal   Peds  Hematology   Anesthesia Other Findings - HLD  Reproductive/Obstetrics negative OB ROS                             Anesthesia Physical Anesthesia Plan  ASA: III  Anesthesia Plan: General   Post-op Pain Management:    Induction: Intravenous  PONV Risk Score and Plan: 3 and Midazolam, Ondansetron and Dexamethasone  Airway Management Planned: Video Laryngoscope Planned and Oral ETT  Additional Equipment: None  Intra-op Plan:   Post-operative Plan: Extubation in OR  Informed Consent: I have reviewed the patients History and Physical, chart, labs and discussed the procedure including the risks, benefits and alternatives for the proposed anesthesia with the patient or authorized representative who has indicated his/her understanding and acceptance.   Dental advisory given  Plan Discussed with: CRNA  Anesthesia Plan Comments: (Cancelled due to elevated blood pressure. Will see cardiologist 1415 today. )       Anesthesia Quick Evaluation

## 2017-04-13 NOTE — Progress Notes (Signed)
Pt's blood pressure trend: 170/112 -->181/112 --> 179/109 --> 198/122 --> 218/127.  Pt's surgery cancelled, wife obtained appt today at cardiologist's office at 2:15pm.

## 2017-04-13 NOTE — Progress Notes (Signed)
Dr. Smith Robert called and made aware of BP.

## 2017-04-20 DIAGNOSIS — E11621 Type 2 diabetes mellitus with foot ulcer: Secondary | ICD-10-CM | POA: Diagnosis not present

## 2017-04-20 DIAGNOSIS — E1142 Type 2 diabetes mellitus with diabetic polyneuropathy: Secondary | ICD-10-CM | POA: Diagnosis not present

## 2017-04-20 DIAGNOSIS — L97422 Non-pressure chronic ulcer of left heel and midfoot with fat layer exposed: Secondary | ICD-10-CM | POA: Diagnosis not present

## 2017-04-21 ENCOUNTER — Encounter (HOSPITAL_COMMUNITY): Payer: Self-pay | Admitting: Vascular Surgery

## 2017-04-21 NOTE — Progress Notes (Signed)
Anesthesia Chart Review: SAME DAY WORK-UP.  Patient is a 52 year old male scheduled for C5-6 ACDF on 04/22/17 by Dr. Newman Pies. Procedure was initially scheduled for 04/13/17, but when he arrived for surgery, BP trends were: 170/112 -->181/112 --> 179/109 --> 198/122 --> 218/127 (of note, his BP at his 04/10/17 PAT visit was 136/84). Surgery was cancelled and follow-up arranged with cardiologist Dr. Bartholome Bill Jefm Bryant; [place]). BP then was 142/86. He wrote, "Continue with current medications for blood pressure control. Patient's blood pressure was high when presenting for elective surgery. Anxiety appears to be playing a role. Will continue with current regimen although will increase blood pressure control by adding metoprolol tartrate 25 mg twice daily to his regimen. Patient remains on low risk for surgery from a cardiac standpoint. Anxiolytics should also be considered prior to surgery."   History includes former smoker, CAD (s/p DES DIAG1 11/20/09), DM2 (DKA admission 05/2016), neuropathy, PAD (right axillary artery stenosis; diabetic left heel ulcer with unsuccessful left PT artery atherectomy 05/20/16), HTN, HLD, LE edema, GERD, glaucoma, childhood asthma, fatty liver, arthritis, cholecystectomy.  - PCP is Dr. [name]. He had recommended cardiac clearance prior to surgery (which was obtained). - Cardiologist is Dr. Bartholome Bill. - Vascular surgeon is Dr. Hortencia Pilar. He also saw Dr. Deitra Mayo in 2017 for right axillary stenosis on CTA. He felt stenosis was more likely related to atherosclerotic plaque rather than thrombus. He was not convinced that right arm symptoms were due to his stenosis.   Meds include: ASA 81 mg (on hold),  Plavix (reported mediation discontinued all together; was not listed as a current medication in 03/2017 cardiology notes), Lopressor 37.5 mg BID, Nexium, Lasix 40 mg daily, Neurontin, Novolog with meals, Lantus, Xalatan ophthalmic,  Combigan ophthalmic, lisinopril 40 mg daily, losartan-HCTZ 100-25 mg daily, metformin, Crestor. Patient's wife confirmed he is on both lisinopril and losartan-HCTZ. Wife also reported that he had taken both of these prior to coming in for 04/13/17. They are concerned that if he now holds his ACE inhibitor and ARB on 04/22/17 that his BP will be high again and surgery may be cancelled. Discussed medication list with anesthesiologist Dr. Tamela Gammon, okay for patient to take Lopressor, lisinopril and losartan-HCTZ on the day of surgery. Based on cardiologist's recommendations, could consider Versed if patient still arrives hypertensive to see if that would help.  EKG 04/03/17 [facility]): NSR, abnormal QRS-T angle, consider primary T wave abnormality. Baseline wanderer.  Nuclear stress test 04/09/17 ([place]): Negative Lexiscan stress.No evidence of reversible ischemia.LVEF 52%. Low risk study.  Cardiac cath 12/02/13 [facility]):  Impressions: There is insignificant coronary artery disease. Left ventricular function is normal.  Coronary Circulation: Normal left main. 40% proximal LAD. Discrete 10% in-stent stenosis DIAG1. 25% OM1. Normal RCA.  Ventricles: No LV global or regional wall motion abnormalities.  CTA chest/abd/pelvis 09/05/15: IMPRESSION: 1. 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 This measures approximately 1.6 cm in length on coronal images. This is distal to the origin of the right subscapular artery, and likely explains the patient's clinical findings. 2. No evidence of aortic dissection. 3. Prominent filling of collaterals along the left side of the neck and upper back. This appears to reflect significant focal narrowing of the left subclavian vein as it passes adjacent to the left central clavicle and left first rib. Would correlate for any evidence of left arm symptoms. 4.  Scattered coronary artery calcifications seen. 5. Minimal bilateral atelectasis noted.  Lungs otherwise clear. 6. Mild scattered calcification along the abdominal aorta and its branches. 7. Borderline enlarged prostate. 8. Mild calcific atherosclerotic disease incidentally noted at the right wrist.  RUE arterial U/S 09/26/15: Impression: Right axillary artery stenosis. Arteries widely patent proximally and distally.  MRI c-spine 03/07/17: IMPRESSION: 1. Severe spinal canal stenosis at C5-C6, worsened from the prior study. No cord signal change. 2. Mild disc degeneration at C4-5 and C6-7 without stenosis.  Patient will need updated labs prior to surgery. A1c 9.9 on 04/10/17 (this was called to neurosurgery on 04/10/17). Cr on 04/03/17 was 1.32. Reported fasting CBGs running in the 120's.  Previously patient's surgery cancelled due to high BP. Medications adjusted by his cardiologist and re-cleared for surgery. Home BP 120/65 on 04/21/17. Pre-op BP medication instructions addressed above. DM not well controlled by last A1c, but reported fasting in the 120 range. If day of surgery labs and BP acceptable and otherwise no acute changes then I would anticipate that he could proceed as planned.  George Hugh [facility] Short Stay Center/Anesthesiology Phone [phone] 04/21/2017 4:15 PM

## 2017-04-21 NOTE — Progress Notes (Signed)
I spoke with Mr Kopp's wife , patient was at work.  Mrs Mcglocklin was concerned when I instructed her to not have patient take Lisinopril or Lorstan- HCTZ the morning of surgery, "his blood pressure will be too high."  Patient's surgery was cancelled in December due to elevated blood pressure, patient was seen since by his cardiologist and Metoprolol 37.5 was add to his medication regime.  Mrs Murri reports that his blood pressure this am was 120/60. Mrs Pelzel reported that patient took the 2 medications when he came in in December and his blood pressure was still high.  I told Mrs Delair that I could not give patient permission to take the blood pressure mediations that I would inform the PA for anesthesia and have her review.  I spoke to [place] and she talked with Dr Tobias Alexander would said that patinet can take Lisinopril and Losartan- HCTZ, but not Lasix.  I informed Mrs Lofaso.  Mrs Crable reports that she checks blood pressure in both arms and it runs the same.  Mrs Jarrett states that CBG was 120 this am.  I instructed her if CBG is > 70 in am to give patient 1/2 of Lantus dose and if CBG > 220 to give patient 1/2 of SS dose. I instructed patient to check CBG after awaking and every 2 hours until arrival  to the hospital.  I Instructed patient if CBG is less than 70 to take 4 Glucose Tablets or 1 tube of Glucose Gel. Recheck CBG in 15 minutes then call pre- op desk at [phone] for further instructions. If scheduled to receive Insulin, do not take Insulin.

## 2017-04-22 ENCOUNTER — Ambulatory Visit (HOSPITAL_COMMUNITY): Admission: RE | Admit: 2017-04-22 | Payer: 59 | Source: Ambulatory Visit | Admitting: Neurosurgery

## 2017-04-22 SURGERY — ANTERIOR CERVICAL DECOMPRESSION/DISCECTOMY FUSION 1 LEVEL
Anesthesia: General

## 2017-04-23 ENCOUNTER — Encounter: Payer: Self-pay | Admitting: Internal Medicine

## 2017-04-23 ENCOUNTER — Ambulatory Visit (INDEPENDENT_AMBULATORY_CARE_PROVIDER_SITE_OTHER): Payer: 59 | Admitting: Internal Medicine

## 2017-04-23 VITALS — BP 124/78 | HR 69 | Temp 98.1°F | Resp 16 | Ht 70.0 in | Wt 215.1 lb

## 2017-04-23 DIAGNOSIS — E1165 Type 2 diabetes mellitus with hyperglycemia: Secondary | ICD-10-CM | POA: Diagnosis not present

## 2017-04-23 DIAGNOSIS — E1139 Type 2 diabetes mellitus with other diabetic ophthalmic complication: Secondary | ICD-10-CM | POA: Diagnosis not present

## 2017-04-23 DIAGNOSIS — R059 Cough, unspecified: Secondary | ICD-10-CM

## 2017-04-23 DIAGNOSIS — J40 Bronchitis, not specified as acute or chronic: Secondary | ICD-10-CM

## 2017-04-23 DIAGNOSIS — J069 Acute upper respiratory infection, unspecified: Secondary | ICD-10-CM | POA: Diagnosis not present

## 2017-04-23 DIAGNOSIS — R05 Cough: Secondary | ICD-10-CM | POA: Diagnosis not present

## 2017-04-23 DIAGNOSIS — IMO0002 Reserved for concepts with insufficient information to code with codable children: Secondary | ICD-10-CM

## 2017-04-23 LAB — POC INFLUENZA A&B (BINAX/QUICKVUE)
Influenza A, POC: NEGATIVE
Influenza B, POC: NEGATIVE

## 2017-04-23 MED ORDER — ALBUTEROL SULFATE (2.5 MG/3ML) 0.083% IN NEBU
2.5000 mg | INHALATION_SOLUTION | Freq: Once | RESPIRATORY_TRACT | Status: AC
  Administered 2017-04-23: 2.5 mg via RESPIRATORY_TRACT

## 2017-04-23 MED ORDER — IPRATROPIUM-ALBUTEROL 0.5-2.5 (3) MG/3ML IN SOLN
3.0000 mL | Freq: Once | RESPIRATORY_TRACT | Status: AC
  Administered 2017-04-23: 3 mL via RESPIRATORY_TRACT

## 2017-04-23 MED ORDER — AZITHROMYCIN 250 MG PO TABS: ORAL_TABLET | ORAL | 0 refills | Status: DC

## 2017-04-23 NOTE — Patient Instructions (Addendum)
Please follow up in 1 week or less if not better  Please follow up with your PCP as scheduled if you need to reschedule b/c of surgery no later than 4-6 weeks  Take care    Ref. Range 04/03/2017 09:30  Sodium Latest Ref Range: 135 - 145 mEq/L 136  Potassium Latest Ref Range: 3.5 - 5.1 mEq/L 4.2  Chloride Latest Ref Range: 96 - 112 mEq/L 102  CO2 Latest Ref Range: 19 - 32 mEq/L 25  Glucose Latest Ref Range: 70 - 99 mg/dL 134 (H)  BUN Latest Ref Range: 6 - 23 mg/dL 25 (H)  Creatinine Latest Ref Range: 0.40 - 1.50 mg/dL 1.32  Calcium Latest Ref Range: 8.4 - 10.5 mg/dL 9.7  Alkaline Phosphatase Latest Ref Range: 39 - 117 U/L 79  Albumin Latest Ref Range: 3.5 - 5.2 g/dL 3.6  AST Latest Ref Range: 0 - 37 U/L 19  ALT Latest Ref Range: 0 - 53 U/L 12  Total Protein Latest Ref Range: 6.0 - 8.3 g/dL 7.0  Total Bilirubin Latest Ref Range: 0.2 - 1.2 mg/dL 0.9  GFR Latest Ref Range: >60.00 mL/min 60.15   Results for Chase Clark, Chase Clark (MRN [id number]) as of 04/23/2017 16:20  Ref. Range 04/03/2017 09:30  Iron Latest Ref Range: 50 - 180 mcg/dL 65  TIBC Latest Ref Range: 250 - 425 mcg/dL (calc) 318  %SAT Latest Ref Range: 15 - 60 % (calc) 20  Ferritin Latest Ref Range: 20 - 380 ng/mL 48  Results for Chase Clark, Chase Clark (MRN [id number]) as of 04/23/2017 16:20  Ref. Range 04/10/2017 08:31  WBC Latest Ref Range: 4.0 - 10.5 K/uL 7.9  RBC Latest Ref Range: 4.22 - 5.81 MIL/uL 3.96 (L)  Hemoglobin Latest Ref Range: 13.0 - 17.0 g/dL 11.4 (L)  HCT Latest Ref Range: 39.0 - 52.0 % 34.3 (L)  MCV Latest Ref Range: 78.0 - 100.0 fL 86.6  MCH Latest Ref Range: 26.0 - 34.0 pg 28.8  MCHC Latest Ref Range: 30.0 - 36.0 g/dL 33.2  RDW Latest Ref Range: 11.5 - 15.5 % 13.7  Platelets Latest Ref Range: 150 - 400 K/uL 236  Results for Chase Clark, Chase Clark (MRN [id number]) as of 04/23/2017 16:20  Ref. Range 04/10/2017 08:31  Hemoglobin A1C Latest Ref Range: 4.8 - 5.6 % 9.9 (H)     Cough, Adult Coughing is a reflex that clears  your throat and your airways. Coughing helps to heal and protect your lungs. It is normal to cough occasionally, but a cough that happens with other symptoms or lasts a long time may be a sign of a condition that needs treatment. A cough may last only 2-3 weeks (acute), or it may last longer than 8 weeks (chronic). What are the causes? Coughing is commonly caused by:  Breathing in substances that irritate your lungs.  A viral or bacterial respiratory infection.  Allergies.  Asthma.  Postnasal drip.  Smoking.  Acid backing up from the stomach into the esophagus (gastroesophageal reflux).  Certain medicines.  Chronic lung problems, including COPD (or rarely, lung cancer).  Other medical conditions such as heart failure.  Follow these instructions at home: Pay attention to any changes in your symptoms. Take these actions to help with your discomfort:  Take medicines only as told by your health care provider. ? If you were prescribed an antibiotic medicine, take it as told by your health care provider. Do not stop taking the antibiotic even if you start to feel better. ? Talk with your  health care provider before you take a cough suppressant medicine.  Drink enough fluid to keep your urine clear or pale yellow.  If the air is dry, use a cold steam vaporizer or humidifier in your bedroom or your home to help loosen secretions.  Avoid anything that causes you to cough at work or at home.  If your cough is worse at night, try sleeping in a semi-upright position.  Avoid cigarette smoke. If you smoke, quit smoking. If you need help quitting, ask your health care provider.  Avoid caffeine.  Avoid alcohol.  Rest as needed.  Contact a health care provider if:  You have new symptoms.  You cough up pus.  Your cough does not get better after 2-3 weeks, or your cough gets worse.  You cannot control your cough with suppressant medicines and you are losing sleep.  You develop  pain that is getting worse or pain that is not controlled with pain medicines.  You have a fever.  You have unexplained weight loss.  You have night sweats. Get help right away if:  You cough up blood.  You have difficulty breathing.  Your heartbeat is very fast. This information is not intended to replace advice given to you by your health care provider. Make sure you discuss any questions you have with your health care provider. Document Released: 09/27/2010 Document Revised: 09/06/2015 Document Reviewed: 06/07/2014 Elsevier Interactive Patient Education  [employer].

## 2017-04-23 NOTE — Progress Notes (Signed)
Chief Complaint  Patient presents with  . Nasal Congestion   Sick visit he was at [facility] Friday prior to visit with father and Sunday developed Sore throat/scratchy. Monday and Tues. He had cough that was getting worse but he feels overall cough is getting somewhat better. Clear sputum. He had lumbar fusion C5-C6 scheduled for yesterday but they rec he see his PCP. He also has had chest congestion, wheezing, denies body aches, he has felt tired. He has run a fever Tuesday and Wednesday max yesterday was 100.6. He has MSK pain from coughing. He also has runny nose.    Review of Systems  Constitutional: Positive for fever and malaise/fatigue.  HENT: Positive for sore throat. Negative for hearing loss.   Respiratory: Positive for cough and wheezing. Negative for shortness of breath.   Cardiovascular: Negative for chest pain.  Skin: Negative for rash.  Psychiatric/Behavioral: Negative for memory loss.   Past Medical History:  Diagnosis Date  . Arthritis   . Asthma    as child  . Coronary artery disease    DES DIAG1 11/20/09 [facility])  . Diabetes mellitus without complication ([place])   . Edema, lower extremity   . Fatty liver   . GERD (gastroesophageal reflux disease)   . Glaucoma   . Heart disease 2011   Patient has stent for 80% blockage  . Hyperlipidemia   . Hypertension   . Neuropathy   . Seasonal allergies   . Shoulder pain   . Ulcer of foot ([place]) 08.06.2014   DIABETIC ULCERATIONS ASSOCIATED WITH IRRITATION LATERAL ANKLE LEFT GREATER THAN RIGHT WITH MILD CELLULITIS   Past Surgical History:  Procedure Laterality Date  . ABDOMINAL AORTOGRAM N/A 05/20/2016   Procedure: Abdominal Aortogram possible intervention;  Surgeon: Katha Cabal, MD;  Location: [place] [facility];  Service: Cardiovascular;  Laterality: N/A;  . CARDIAC CATHETERIZATION    . CATARACT EXTRACTION W/ INTRAOCULAR LENS IMPLANT    . CATARACT EXTRACTION W/PHACO Left 02/26/2017   Procedure: CATARACT EXTRACTION PHACO  AND INTRAOCULAR LENS PLACEMENT (IOC);  Surgeon: Eulogio Bear, MD;  Location: [facility] ORS;  Service: Ophthalmology;  Laterality: Left;  Lot # [id number] H [place]: 00:25.3 AP%: 6.2 CDE: 1.59   . CHOLECYSTECTOMY N/A   . CORONARY ANGIOPLASTY WITH STENT PLACEMENT  2007   1st diagonal  . EPIBLEPHERON REPAIR WITH TEAR DUCT PROBING    . EYE SURGERY Right sept 29n2015   cataract extraction  . INSERTION EXPRESS TUBE SHUNT Right 09/06/2015   Procedure: INSERTION AHMED TUBE SHUNT with tutoplast allograft;  Surgeon: Eulogio Bear, MD;  Location: [facility] ORS;  Service: Ophthalmology;  Laterality: Right;  Marland Kitchen VASECTOMY     Family History  Problem Relation Age of Onset  . Hyperlipidemia Mother   . Diabetes Mother   . Hyperlipidemia Father   . Diabetes Father   . Heart disease Father   . Hypertension Father    Social History   Socioeconomic History  . Marital status: Married    Spouse name: Not on file  . Number of children: Not on file  . Years of education: Not on file  . Highest education level: Not on file  Social Needs  . Financial resource strain: Not on file  . Food insecurity - worry: Not on file  . Food insecurity - inability: Not on file  . Transportation needs - medical: Not on file  . Transportation needs - non-medical: Not on file  Occupational History  . Not on file  Tobacco Use  .  Smoking status: Former Research scientist (life sciences)  . Smokeless tobacco: Never Used  Substance and Sexual Activity  . Alcohol use: Yes    Comment: OCCAS  . Drug use: No  . Sexual activity: Not on file  Other Topics Concern  . Not on file  Social History Narrative   Used to work as paramedic    Current Meds  Medication Sig  . acetaminophen (TYLENOL) 500 MG tablet Take 1,000 mg by mouth every 8 (eight) hours as needed for mild pain or headache.  . brimonidine-timolol (COMBIGAN) 0.2-0.5 % ophthalmic solution Place 1 drop into the right eye every 12 (twelve) hours.   . DUREZOL 0.05 % EMUL APPLY 1 DROP(S) IN LEFT EYE  2 TIMES A DAY  . esomeprazole (NEXIUM) 40 MG capsule Take 1 capsule (40 mg total) by mouth daily. (Patient taking differently: Take 40 mg by mouth daily. )  . furosemide (LASIX) 40 MG tablet Take 40 mg by mouth.  . gabapentin (NEURONTIN) 300 MG capsule Take 2 capsules (600 mg total) by mouth 2 (two) times daily.  . insulin aspart (NOVOLOG FLEXPEN) 100 UNIT/ML FlexPen Inject 10 units with breakfast, 10-15 units with lunch, and 15-20 units with supper (Patient taking differently: Inject 15-20 Units into the skin 3 (three) times daily with meals. Per sliding scale)  . insulin glargine (LANTUS) 100 UNIT/ML injection Inject 0.45 mLs (45 Units total) into the skin daily. (Patient taking differently: Inject 50 Units into the skin daily. Takes in the morning)  . Insulin Pen Needle (PEN NEEDLES) 32G X 4 MM MISC Inject 90 each 3 (three) times daily into the skin.  Marland Kitchen latanoprost (XALATAN) 0.005 % ophthalmic solution Place 1 drop into both eyes at bedtime.  Marland Kitchen lisinopril (PRINIVIL,ZESTRIL) 40 MG tablet Take 40 mg by mouth daily.  Marland Kitchen losartan-hydrochlorothiazide (HYZAAR) 100-25 MG tablet Take 1 tablet by mouth daily.  . metFORMIN (GLUCOPHAGE) 1000 MG tablet Take 1 tablet (1,000 mg total) by mouth 2 (two) times daily with a meal.  . metoprolol tartrate (LOPRESSOR) 50 MG tablet Take 37.5 mg by mouth 2 (two) times daily.  . Multiple Vitamin (MULTIVITAMIN WITH MINERALS) TABS tablet Take 1 tablet by mouth daily.  . Probiotic Product (PROBIOTIC PO) Take 1 tablet by mouth daily.  . rosuvastatin (CRESTOR) 20 MG tablet Take 1 tablet (20 mg total) daily by mouth.   Allergies  Allergen Reactions  . Atorvastatin Other (See Comments)    Myalgias    Recent Results (from the past 2160 hour(s))  Glucose, capillary     Status: Abnormal   Collection Time: 02/26/17  8:04 AM  Result Value Ref Range   Glucose-Capillary 203 (H) 65 - 99 mg/dL  Comp Met (CMET)     Status: Abnormal   Collection Time: 04/03/17  9:30 AM  Result  Value Ref Range   Sodium 136 135 - 145 mEq/L   Potassium 4.2 3.5 - 5.1 mEq/L   Chloride 102 96 - 112 mEq/L   CO2 25 19 - 32 mEq/L   Glucose, Bld 134 (H) 70 - 99 mg/dL   BUN 25 (H) 6 - 23 mg/dL   Creatinine, Ser 1.32 0.40 - 1.50 mg/dL   Total Bilirubin 0.9 0.2 - 1.2 mg/dL   Alkaline Phosphatase 79 39 - 117 U/L   AST 19 0 - 37 U/L   ALT 12 0 - 53 U/L   Total Protein 7.0 6.0 - 8.3 g/dL   Albumin 3.6 3.5 - 5.2 g/dL   Calcium 9.7 8.4 - 10.5  mg/dL   GFR 60.15 >60.00 mL/min  Iron, TIBC and Ferritin Panel     Status: None   Collection Time: 04/03/17  9:30 AM  Result Value Ref Range   Iron 65 50 - 180 mcg/dL   TIBC 318 250 - 425 mcg/dL (calc)   %SAT 20 15 - 60 % (calc)   Ferritin 48 20 - 380 ng/mL  Glucose, capillary     Status: Abnormal   Collection Time: 04/10/17  8:00 AM  Result Value Ref Range   Glucose-Capillary 275 (H) 65 - 99 mg/dL  Surgical pcr screen     Status: None   Collection Time: 04/10/17  8:04 AM  Result Value Ref Range   MRSA, PCR NEGATIVE NEGATIVE   Staphylococcus aureus NEGATIVE NEGATIVE    Comment: (NOTE) The Xpert SA Assay (FDA approved for NASAL specimens in patients 99 years of age and older), is one component of a comprehensive surveillance program. It is not intended to diagnose infection nor to guide or monitor treatment.   CBC     Status: Abnormal   Collection Time: 04/10/17  8:31 AM  Result Value Ref Range   WBC 7.9 4.0 - 10.5 K/uL   RBC 3.96 (L) 4.22 - 5.81 MIL/uL   Hemoglobin 11.4 (L) 13.0 - 17.0 g/dL   HCT 34.3 (L) 39.0 - 52.0 %   MCV 86.6 78.0 - 100.0 fL   MCH 28.8 26.0 - 34.0 pg   MCHC 33.2 30.0 - 36.0 g/dL   RDW 13.7 11.5 - 15.5 %   Platelets 236 150 - 400 K/uL  Hemoglobin A1c     Status: Abnormal   Collection Time: 04/10/17  8:31 AM  Result Value Ref Range   Hgb A1c MFr Bld 9.9 (H) 4.8 - 5.6 %    Comment: (NOTE) Pre diabetes:          5.7%-6.4% Diabetes:              >6.4% Glycemic control for   <7.0% adults with diabetes    Mean  Plasma Glucose 237.43 mg/dL  Protime-INR     Status: None   Collection Time: 04/10/17  8:31 AM  Result Value Ref Range   Prothrombin Time 13.4 11.4 - 15.2 seconds   INR 1.03   Type and screen     Status: None   Collection Time: 04/10/17  9:00 AM  Result Value Ref Range   ABO/RH(D) B POS    Antibody Screen NEG    Sample Expiration 04/24/2017    Extend sample reason NO TRANSFUSIONS OR PREGNANCY IN THE PAST 3 MONTHS   ABO/Rh     Status: None   Collection Time: 04/10/17  9:00 AM  Result Value Ref Range   ABO/RH(D) B POS   Glucose, capillary     Status: Abnormal   Collection Time: 04/13/17  8:34 AM  Result Value Ref Range   Glucose-Capillary 111 (H) 65 - 99 mg/dL   Comment 1 Notify RN    Comment 2 Document in Chart   Glucose, capillary     Status: Abnormal   Collection Time: 04/13/17 10:51 AM  Result Value Ref Range   Glucose-Capillary 123 (H) 65 - 99 mg/dL  POC Influenza A&B(BINAX/QUICKVUE)     Status: Normal   Collection Time: 04/23/17  5:36 PM  Result Value Ref Range   Influenza A, POC Negative Negative   Influenza B, POC Negative Negative   Objective  Body mass index is 30.87 kg/m. Wt Readings from  Last 3 Encounters:  04/23/17 215 lb 2 oz (97.6 kg)  04/13/17 220 lb (99.8 kg)  04/10/17 220 lb 0.3 oz (99.8 kg)   Temp Readings from Last 3 Encounters:  04/23/17 98.1 F (36.7 C) (Oral)  04/13/17 (!) 97.4 F (36.3 C) (Oral)  04/10/17 97.9 F (36.6 C) (Oral)   BP Readings from Last 3 Encounters:  04/23/17 124/78  04/13/17 (!) 145/89  04/10/17 136/84   Pulse Readings from Last 3 Encounters:  04/23/17 69  04/13/17 94  04/10/17 98   O2 sat room air 97%  Physical Exam  Constitutional: He is oriented to person, place, and time and well-developed, well-nourished, and in no distress. Vital signs are normal.  HENT:  Head: Normocephalic and atraumatic.  Mouth/Throat: Oropharynx is clear and moist and mucous membranes are normal.  Eyes: Conjunctivae are normal. Pupils  are equal, round, and reactive to light.  Cardiovascular: Normal rate, regular rhythm and normal heart sounds.  No murmur heard. Pulmonary/Chest: Effort normal. He has wheezes.  Neurological: He is alert and oriented to person, place, and time. Gait normal. Gait normal.  Skin: Skin is warm, dry and intact.  Psychiatric: Mood, memory, affect and judgment normal.  Nursing note and vitals reviewed.   Assessment   1. Cough wheezing with fever ddx URI/bronchitis could be pneumonia with h/o fever Tues/Weds flu negative today  2. Pending C5/C6 fusion 3. HM 4. DM 2 uncontrolled A1C 9.9 04/10/17  Plan  1.  Neb tx duoneb x1 in office  rec cont mucinex, prn tylenol zpack  RTC as sch 1/16 with PCP will need CXR if not better   2. Needs cardiac/surgical clearance with PCP at f/u surgery upcoming 1/17 I believe  3.  Had flu shot, had prevnar and Tdap  Consider pna 23 vx in future if has not had and shingrix   Consider PSA and DRE in future do not see one on file  Colonoscopy 12/19/11 diverticulosis repeat in 10 years  Pt ws former smoker quit 2010 smoked 20 total years 1st 10 year per pt <1 ppd last 10 years 1 ppd reviewed CT chest had nodules noted remotely last CTA chest 09/05/15 no comment on nodules/none noted.   4. Address with PCP at f/u     Provider: Dr. Olivia Mackie McLean-Scocuzza-Internal Medicine

## 2017-04-28 DIAGNOSIS — L97422 Non-pressure chronic ulcer of left heel and midfoot with fat layer exposed: Secondary | ICD-10-CM | POA: Diagnosis not present

## 2017-04-28 DIAGNOSIS — E1142 Type 2 diabetes mellitus with diabetic polyneuropathy: Secondary | ICD-10-CM | POA: Diagnosis not present

## 2017-04-28 DIAGNOSIS — E11621 Type 2 diabetes mellitus with foot ulcer: Secondary | ICD-10-CM | POA: Diagnosis not present

## 2017-04-29 ENCOUNTER — Observation Stay
Admission: EM | Admit: 2017-04-29 | Discharge: 2017-04-30 | Disposition: A | Discharge status: Home / Self Care | Payer: 59 | Attending: Specialist | Admitting: Specialist

## 2017-04-29 ENCOUNTER — Ambulatory Visit (INDEPENDENT_AMBULATORY_CARE_PROVIDER_SITE_OTHER): Payer: 59 | Admitting: Family Medicine

## 2017-04-29 ENCOUNTER — Telehealth: Payer: Self-pay | Admitting: Family Medicine

## 2017-04-29 ENCOUNTER — Emergency Department: Payer: 59

## 2017-04-29 ENCOUNTER — Encounter: Payer: Self-pay | Admitting: Family Medicine

## 2017-04-29 ENCOUNTER — Other Ambulatory Visit: Payer: Self-pay

## 2017-04-29 VITALS — BP 180/110 | HR 72 | Temp 98.3°F | Wt 215.4 lb

## 2017-04-29 DIAGNOSIS — J069 Acute upper respiratory infection, unspecified: Secondary | ICD-10-CM

## 2017-04-29 DIAGNOSIS — E1139 Type 2 diabetes mellitus with other diabetic ophthalmic complication: Secondary | ICD-10-CM

## 2017-04-29 DIAGNOSIS — Z79899 Other long term (current) drug therapy: Secondary | ICD-10-CM | POA: Insufficient documentation

## 2017-04-29 DIAGNOSIS — E1165 Type 2 diabetes mellitus with hyperglycemia: Secondary | ICD-10-CM

## 2017-04-29 DIAGNOSIS — R809 Proteinuria, unspecified: Secondary | ICD-10-CM

## 2017-04-29 DIAGNOSIS — I119 Hypertensive heart disease without heart failure: Secondary | ICD-10-CM | POA: Diagnosis not present

## 2017-04-29 DIAGNOSIS — I10 Essential (primary) hypertension: Secondary | ICD-10-CM | POA: Diagnosis not present

## 2017-04-29 DIAGNOSIS — Z87891 Personal history of nicotine dependence: Secondary | ICD-10-CM | POA: Diagnosis not present

## 2017-04-29 DIAGNOSIS — Z794 Long term (current) use of insulin: Secondary | ICD-10-CM | POA: Insufficient documentation

## 2017-04-29 DIAGNOSIS — L97429 Non-pressure chronic ulcer of left heel and midfoot with unspecified severity: Secondary | ICD-10-CM | POA: Diagnosis not present

## 2017-04-29 DIAGNOSIS — Z7982 Long term (current) use of aspirin: Secondary | ICD-10-CM | POA: Diagnosis not present

## 2017-04-29 DIAGNOSIS — N19 Unspecified kidney failure: Secondary | ICD-10-CM | POA: Diagnosis present

## 2017-04-29 DIAGNOSIS — E11621 Type 2 diabetes mellitus with foot ulcer: Secondary | ICD-10-CM | POA: Insufficient documentation

## 2017-04-29 DIAGNOSIS — Z01818 Encounter for other preprocedural examination: Secondary | ICD-10-CM | POA: Diagnosis not present

## 2017-04-29 DIAGNOSIS — N179 Acute kidney failure, unspecified: Principal | ICD-10-CM | POA: Insufficient documentation

## 2017-04-29 DIAGNOSIS — K219 Gastro-esophageal reflux disease without esophagitis: Secondary | ICD-10-CM | POA: Insufficient documentation

## 2017-04-29 DIAGNOSIS — E785 Hyperlipidemia, unspecified: Secondary | ICD-10-CM | POA: Insufficient documentation

## 2017-04-29 DIAGNOSIS — I2510 Atherosclerotic heart disease of native coronary artery without angina pectoris: Secondary | ICD-10-CM | POA: Diagnosis not present

## 2017-04-29 DIAGNOSIS — IMO0002 Reserved for concepts with insufficient information to code with codable children: Secondary | ICD-10-CM

## 2017-04-29 LAB — URINALYSIS, COMPLETE (UACMP) WITH MICROSCOPIC
Bacteria, UA: NONE SEEN
Bilirubin Urine: NEGATIVE
Glucose, UA: NEGATIVE mg/dL
Ketones, ur: NEGATIVE mg/dL
Leukocytes, UA: NEGATIVE
Nitrite: NEGATIVE
Protein, ur: >=300 mg/dL — AB
Specific Gravity, Urine: 1.018 (ref 1.005–1.030)
pH: 5 (ref 5.0–8.0)

## 2017-04-29 LAB — COMPREHENSIVE METABOLIC PANEL
ALT: 16 U/L — ABNORMAL LOW (ref 17–63)
AST: 27 U/L (ref 15–41)
Albumin: 3.6 g/dL (ref 3.5–5.0)
Alkaline Phosphatase: 70 U/L (ref 38–126)
Anion gap: 9 (ref 5–15)
BUN: 37 mg/dL — ABNORMAL HIGH (ref 6–20)
CO2: 25 mmol/L (ref 22–32)
Calcium: 9.2 mg/dL (ref 8.9–10.3)
Chloride: 105 mmol/L (ref 101–111)
Creatinine, Ser: 2.01 mg/dL — ABNORMAL HIGH (ref 0.61–1.24)
GFR calc Af Amer: 42 mL/min — ABNORMAL LOW (ref >60)
GFR calc non Af Amer: 36 mL/min — ABNORMAL LOW (ref >60)
Glucose, Bld: 69 mg/dL (ref 65–99)
Potassium: 4.5 mmol/L (ref 3.5–5.1)
Sodium: 139 mmol/L (ref 135–145)
Total Bilirubin: 0.5 mg/dL (ref 0.3–1.2)
Total Protein: 7.2 g/dL (ref 6.5–8.1)

## 2017-04-29 LAB — CBC
HCT: 31.6 % — ABNORMAL LOW (ref 40.0–52.0)
Hemoglobin: 10.8 g/dL — ABNORMAL LOW (ref 13.0–18.0)
MCH: 28.9 pg (ref 26.0–34.0)
MCHC: 34.3 g/dL (ref 32.0–36.0)
MCV: 84.1 fL (ref 80.0–100.0)
Platelets: 280 10*3/uL (ref 150–440)
RBC: 3.76 MIL/uL — ABNORMAL LOW (ref 4.40–5.90)
RDW: 13.6 % (ref 11.5–14.5)
WBC: 10.1 10*3/uL (ref 3.8–10.6)

## 2017-04-29 LAB — BASIC METABOLIC PANEL
BUN: 33 mg/dL — ABNORMAL HIGH (ref 6–23)
CO2: 27 mEq/L (ref 19–32)
Calcium: 9.2 mg/dL (ref 8.4–10.5)
Chloride: 103 mEq/L (ref 96–112)
Creatinine, Ser: 2.1 mg/dL — ABNORMAL HIGH (ref 0.40–1.50)
GFR: 35.19 mL/min — ABNORMAL LOW (ref >60.00)
Glucose, Bld: 95 mg/dL (ref 70–99)
Potassium: 4.3 mEq/L (ref 3.5–5.1)
Sodium: 138 mEq/L (ref 135–145)

## 2017-04-29 MED ORDER — BUTALBITAL-APAP-CAFFEINE 50-325-40 MG PO TABS
2.0000 | ORAL_TABLET | Freq: Once | ORAL | Status: AC
Start: 2017-04-30 — End: 2017-04-30
  Administered 2017-04-30: 2 via ORAL
  Filled 2017-04-29: qty 2

## 2017-04-29 MED ORDER — LABETALOL HCL 5 MG/ML IV SOLN
5.0000 mg | Freq: Once | INTRAVENOUS | Status: AC
  Administered 2017-04-29: 5 mg via INTRAVENOUS
  Filled 2017-04-29: qty 4

## 2017-04-29 MED ORDER — DIPHENHYDRAMINE HCL 50 MG/ML IJ SOLN
25.0000 mg | Freq: Once | INTRAMUSCULAR | Status: AC
Start: 2017-04-29 — End: 2017-04-29
  Administered 2017-04-29: 25 mg via INTRAVENOUS
  Filled 2017-04-29: qty 1

## 2017-04-29 MED ORDER — PROCHLORPERAZINE EDISYLATE 5 MG/ML IJ SOLN
10.0000 mg | Freq: Once | INTRAMUSCULAR | Status: AC
  Administered 2017-04-29: 10 mg via INTRAVENOUS
  Filled 2017-04-29: qty 2

## 2017-04-29 MED ORDER — SODIUM CHLORIDE 0.9 % IV BOLUS (SEPSIS)
1000.0000 mL | Freq: Once | INTRAVENOUS | Status: AC
  Administered 2017-04-29: 1000 mL via INTRAVENOUS

## 2017-04-29 NOTE — Assessment & Plan Note (Signed)
Elevated today though fairly well controlled at home.  Suspect some measure of whitecoat hypertension.  He is currently back on lisinopril and losartan.  He notes he discuss this with his cardiologist and they did discuss risks with him and felt for his case it would be beneficial.  He will continue to monitor blood pressures.

## 2017-04-29 NOTE — ED Notes (Addendum)
Call from [place] office telling pt to come in and get fluids for dehydration. Elevated BUN and creatinine. No vomiting. Diarrhea for past 3 days. Family at bedside.

## 2017-04-29 NOTE — Telephone Encounter (Signed)
Myself and Janett Billow attempted to contact the numbers listed for this patient regarding his lab work from earlier today.  There was no answer.  We left messages asking him to call back to the office.  His creatinine has gone from 1.3 to 2.1.  I believe he needs an emergency room evaluation to consider IV fluids.  He needs to hold his Lasix and lisinopril as well.  I have sent him a my chart message earlier as well given that he tends to respond better to those.  His creatinine increase in my mind would preclude him from having surgery tomorrow.  We have faxed the labs with a note for myself to his surgeon and have called their office.  We will attempt to get in touch with them again.  I have asked the patient to call back to the office as soon as he gets the message.

## 2017-04-29 NOTE — ED Provider Notes (Signed)
[facility] Emergency Department Provider Note    None    (approximate)  I have reviewed the triage vital signs and the nursing notes.   HISTORY  Chief Complaint Abnormal Lab    HPI Chase Clark is a 52 y.o. male with no recent hospitalizations history of hypertension diabetes borderline renal dysfunction presents to the ER for evaluation of abnormal renal function tests done in clinic.  Patient was complaining of worsening lower extremity swelling roughly a month ago and he was subsequently placed on Lopressor as well as Lasix with subsequent improvement in his lower extremity swelling.  Past Medical History:  Diagnosis Date  . Arthritis   . Asthma    as child  . Coronary artery disease    DES DIAG1 11/20/09 [facility])  . Diabetes mellitus without complication ([place])   . Edema, lower extremity   . Fatty liver   . GERD (gastroesophageal reflux disease)   . Glaucoma   . Heart disease 2011   Patient has stent for 80% blockage  . Hyperlipidemia   . Hypertension   . Neuropathy   . Seasonal allergies   . Shoulder pain   . Ulcer of foot ([place]) 08.06.2014   DIABETIC ULCERATIONS ASSOCIATED WITH IRRITATION LATERAL ANKLE LEFT GREATER THAN RIGHT WITH MILD CELLULITIS   Family History  Problem Relation Age of Onset  . Hyperlipidemia Mother   . Diabetes Mother   . Liver disease Mother        NASH  . Hyperlipidemia Father   . Diabetes Father   . Heart disease Father   . Hypertension Father    Past Surgical History:  Procedure Laterality Date  . ABDOMINAL AORTOGRAM N/A 05/20/2016   Procedure: Abdominal Aortogram possible intervention;  Surgeon: Katha Cabal, MD;  Location: [place] [facility];  Service: Cardiovascular;  Laterality: N/A;  . CARDIAC CATHETERIZATION    . CATARACT EXTRACTION W/ INTRAOCULAR LENS IMPLANT    . CATARACT EXTRACTION W/PHACO Left 02/26/2017   Procedure: CATARACT EXTRACTION PHACO AND INTRAOCULAR LENS PLACEMENT (IOC);  Surgeon:  Eulogio Bear, MD;  Location: [facility] ORS;  Service: Ophthalmology;  Laterality: Left;  Lot # [id number] H [place]: 00:25.3 AP%: 6.2 CDE: 1.59   . CHOLECYSTECTOMY N/A   . CORONARY ANGIOPLASTY WITH STENT PLACEMENT  2007   1st diagonal  . EPIBLEPHERON REPAIR WITH TEAR DUCT PROBING    . EYE SURGERY Right sept 29n2015   cataract extraction  . INSERTION EXPRESS TUBE SHUNT Right 09/06/2015   Procedure: INSERTION AHMED TUBE SHUNT with tutoplast allograft;  Surgeon: Eulogio Bear, MD;  Location: [facility] ORS;  Service: Ophthalmology;  Laterality: Right;  Marland Kitchen VASECTOMY     Patient Active Problem List   Diagnosis Date Noted  . Upper respiratory infection 04/29/2017  . Preop examination 04/29/2017  . Fatty liver 04/04/2017  . Routine general medical examination at a health care facility 01/13/2017  . DDD (degenerative disc disease), cervical 01/13/2017  . PAD (peripheral artery disease) ([place]) 08/07/2016  . Ulcer of ankle ([place]) 08/07/2016  . DKA (diabetic ketoacidoses) ([place]) 05/17/2016  . Chronic diarrhea 05/17/2016  . Hyponatremia 05/17/2016  . Anemia 01/12/2014  . Disorder of ligament of ankle 07/31/2013  . Edema 06/28/2013  . 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 Obesity (BMI 30-39.9) 06/12/2013  . Pain in joint, shoulder region 06/12/2013  . Statin intolerance 06/10/2013  . Osteomyelitis ([place])   . DM (diabetes mellitus), type 2, uncontrolled w/ophthalmic complication ([place]) 74/25/9563  . Hyperlipidemia  associated with type 2 diabetes mellitus ([place]) 07/17/2006  . GILBERT'S SYNDROME 07/17/2006  . Essential hypertension 07/17/2006  . Coronary atherosclerosis 07/17/2006  . OSTEOARTHRITIS 07/17/2006  . COUGH 07/17/2006      Prior to Admission medications   Medication Sig Start Date End Date Taking? Authorizing Provider  acetaminophen (TYLENOL) 500 MG tablet Take 1,000 mg by mouth every 8 (eight) hours as needed for mild pain or headache.   Yes [provider]  aspirin  EC 81 MG tablet Take 81 mg by mouth daily.   Yes [provider]  brimonidine-timolol (COMBIGAN) 0.2-0.5 % ophthalmic solution Place 1 drop into the right eye every 12 (twelve) hours.    Yes [provider]  DUREZOL 0.05 % EMUL APPLY 1 DROP(S) IN LEFT EYE 2 TIMES A DAY 03/27/17  Yes [provider]  esomeprazole (NEXIUM) 40 MG capsule Take 1 capsule (40 mg total) by mouth daily. Patient taking differently: Take 40 mg by mouth daily.  05/21/16  Yes Wieting, Richard, MD  furosemide (LASIX) 40 MG tablet Take 40 mg by mouth daily.    Yes [provider]  gabapentin (NEURONTIN) 300 MG capsule Take 2 capsules (600 mg total) by mouth 2 (two) times daily. 07/09/16  Yes [name], MD  insulin aspart (NOVOLOG FLEXPEN) 100 UNIT/ML FlexPen Inject 10 units with breakfast, 10-15 units with lunch, and 15-20 units with supper Patient taking differently: Inject 15-20 Units into the skin 3 (three) times daily with meals. Per sliding scale 02/23/17  Yes [name], MD  insulin glargine (LANTUS) 100 UNIT/ML injection Inject 0.45 mLs (45 Units total) into the skin daily. Patient taking differently: Inject 50 Units into the skin daily. Takes in the morning 10/10/16  Yes [name], MD  Insulin Pen Needle (PEN NEEDLES) 32G X 4 MM MISC Inject 90 each 3 (three) times daily into the skin. 02/23/17  Yes [name], MD  latanoprost (XALATAN) 0.005 % ophthalmic solution Place 1 drop into both eyes at bedtime. 03/03/17  Yes [provider]  lisinopril (PRINIVIL,ZESTRIL) 40 MG tablet Take 40 mg by mouth daily.   Yes [provider]  losartan-hydrochlorothiazide (HYZAAR) 100-25 MG tablet Take 1 tablet by mouth daily. 07/15/15  Yes [provider]  metFORMIN (GLUCOPHAGE) 1000 MG tablet Take 1 tablet (1,000 mg total) by mouth 2 (two) times daily with a meal. 07/09/16  Yes [name], MD  metoprolol tartrate (LOPRESSOR) 50 MG tablet Take 37.5 mg  by mouth 2 (two) times daily.   Yes [provider]  Multiple Vitamin (MULTIVITAMIN WITH MINERALS) TABS tablet Take 1 tablet by mouth daily.   Yes [provider]  Probiotic Product (PROBIOTIC PO) Take 1 tablet by mouth daily.   Yes [provider]  rosuvastatin (CRESTOR) 20 MG tablet Take 1 tablet (20 mg total) daily by mouth. 02/23/17  Yes [name], MD    Allergies Atorvastatin    Social History Social History   Tobacco Use  . Smoking status: Former Research scientist (life sciences)  . Smokeless tobacco: Never Used  Substance Use Topics  . Alcohol use: Yes    Comment: OCCAS  . Drug use: No    Review of Systems Patient denies headaches, rhinorrhea, blurry vision, numbness, shortness of breath, chest pain, edema, cough, abdominal pain, nausea, vomiting, diarrhea, dysuria, fevers, rashes or hallucinations unless otherwise stated above in HPI. ____________________________________________   PHYSICAL EXAM:  VITAL SIGNS: Vitals:   04/29/17 2200 04/29/17 2230  BP: Marland Kitchen)  170/98 (!) 183/106  Pulse: 74 85  Resp:    Temp:    SpO2: 98% 98%    Constitutional: Alert and oriented. Well appearing and in no acute distress. Eyes: Conjunctivae are normal.  Head: Atraumatic. Nose: No congestion/rhinnorhea. Mouth/Throat: Mucous membranes are moist.   Neck: No stridor. Painless ROM.  Cardiovascular: Normal rate, regular rhythm. Grossly normal heart sounds.  Good peripheral circulation. Respiratory: Normal respiratory effort.  No retractions. Lungs CTAB. Gastrointestinal: Soft and nontender. No distention. No abdominal bruits. No CVA tenderness. Genitourinary:  Musculoskeletal: No lower extremity tenderness, 1+ BLE edema.  No joint effusions. Neurologic:  Normal speech and language. No gross focal neurologic deficits are appreciated. No facial droop Skin:  Skin is warm, dry and intact. No rash noted. Psychiatric: Mood and affect are normal. Speech and behavior are  normal.  ____________________________________________   LABS (all labs ordered are listed, but only abnormal results are displayed)  Results for orders placed or performed during the hospital encounter of 04/29/17 (from the past 24 hour(s))  CBC     Status: Abnormal   Collection Time: 04/29/17  7:05 PM  Result Value Ref Range   WBC 10.1 3.8 - 10.6 K/uL   RBC 3.76 (L) 4.40 - 5.90 MIL/uL   Hemoglobin 10.8 (L) 13.0 - 18.0 g/dL   HCT 31.6 (L) 40.0 - 52.0 %   MCV 84.1 80.0 - 100.0 fL   MCH 28.9 26.0 - 34.0 pg   MCHC 34.3 32.0 - 36.0 g/dL   RDW 13.6 11.5 - 14.5 %   Platelets 280 150 - 440 K/uL  Comprehensive metabolic panel     Status: Abnormal   Collection Time: 04/29/17  7:05 PM  Result Value Ref Range   Sodium 139 135 - 145 mmol/L   Potassium 4.5 3.5 - 5.1 mmol/L   Chloride 105 101 - 111 mmol/L   CO2 25 22 - 32 mmol/L   Glucose, Bld 69 65 - 99 mg/dL   BUN 37 (H) 6 - 20 mg/dL   Creatinine, Ser 2.01 (H) 0.61 - 1.24 mg/dL   Calcium 9.2 8.9 - 10.3 mg/dL   Total Protein 7.2 6.5 - 8.1 g/dL   Albumin 3.6 3.5 - 5.0 g/dL   AST 27 15 - 41 U/L   ALT 16 (L) 17 - 63 U/L   Alkaline Phosphatase 70 38 - 126 U/L   Total Bilirubin 0.5 0.3 - 1.2 mg/dL   GFR calc non Af Amer 36 (L) >60 mL/min   GFR calc Af Amer 42 (L) >60 mL/min   Anion gap 9 5 - 15  Urinalysis, Complete w Microscopic     Status: Abnormal   Collection Time: 04/29/17  7:07 PM  Result Value Ref Range   Color, Urine YELLOW (A) YELLOW   APPearance HAZY (A) CLEAR   Specific Gravity, Urine 1.018 1.005 - 1.030   pH 5.0 5.0 - 8.0   Glucose, UA NEGATIVE NEGATIVE mg/dL   Hgb urine dipstick SMALL (A) NEGATIVE   Bilirubin Urine NEGATIVE NEGATIVE   Ketones, ur NEGATIVE NEGATIVE mg/dL   Protein, ur >=300 (A) NEGATIVE mg/dL   Nitrite NEGATIVE NEGATIVE   Leukocytes, UA NEGATIVE NEGATIVE   RBC / HPF 0-5 0 - 5 RBC/hpf   WBC, UA 0-5 0 - 5 WBC/hpf   Bacteria, UA NONE SEEN NONE SEEN   Squamous Epithelial / LPF 0-5 (A) NONE SEEN   Mucus  PRESENT    Hyaline Casts, UA PRESENT    ____________________________________________ ________________________________  RADIOLOGY  I personally reviewed all radiographic images ordered to evaluate for the above acute complaints and reviewed radiology reports and findings.  These findings were personally discussed with the patient.  Please see medical record for radiology report.  ____________________________________________   PROCEDURES  Procedure(s) performed:  Procedures    Critical Care performed: no ____________________________________________   INITIAL IMPRESSION / ASSESSMENT AND PLAN / ED COURSE  Pertinent labs & imaging results that were available during my care of the patient were reviewed by me and considered in my medical decision making (see chart for details).  DDX: AK I, anasarca, electrolyte abnormality, dehydration, glomerular nephritis  VEDANSH KERSTETTER is a 52 y.o. who presents to the ED with asymptomatic elevation of creatinine.  Patient does have lower extremity edema but no grossly elevated hypertension or evidence of anasarca.  Patient states that the swelling in his legs are is actually significantly improved from previous.  Does have AK I with proteinuria but no red cells.  There is evidence of hyaline casts.  Do suspect some component of dehydration therefore will give IV fluids.  Will check renal ultrasound.  I spoke with Dr. Candiss Norse of nephrology regarding the patient's presentation.  If renal ultrasound is normal patient will be appropriate for outpatient follow-up.  If abnormal will discuss with hospitalist for admission for IV fluids and further medical management.  Clinical Course as of Apr 29 2301  Wed Apr 29, 2017  2245 Ultrasound does show some increased echogenicity of the renal parenchyma.  Blood pressure is increased in the 180s.  Will give IV labetalol.  Based on these factors I do believe patient will require admission for blood pressure management  as well as renal consultation.  Have discussed with the patient and available family all diagnostics and treatments performed thus far and all questions were answered to the best of my ability. The patient demonstrates understanding and agreement with plan.   [PR]    Clinical Course User Index [PR] Merlyn Lot, MD     ____________________________________________   FINAL CLINICAL IMPRESSION(S) / ED DIAGNOSES  Final diagnoses:  Hypertension, unspecified type  AKI (acute kidney injury) ([place])  Proteinuria, unspecified type      NEW MEDICATIONS STARTED DURING THIS VISIT:  New Prescriptions   No medications on file     Note:  This document was prepared using Dragon voice recognition software and may include unintentional dictation errors.    Merlyn Lot, MD 04/29/17 2303

## 2017-04-29 NOTE — Progress Notes (Signed)
Chase Rumps, MD Phone: [phone]  Chase Clark is a 52 y.o. male who presents today for follow-up.  Hypertension: Typically around 140/80.  Taking lisinopril, losartan, HCTZ, metoprolol, and Lasix.  No chest pain or shortness of breath.  He saw his cardiologist and he had a low risk Lexiscan.  Diabetes: 115 this morning.  200 glucose with his illness recently.  He is taking NovoLog and Lantus.  He is up to 60 units daily of Lantus.  No polyuria or polydipsia.  No recent hypoglycemia.  He was seen about a week ago with fever and coughing.  Was treated with a Z-Pak and has improved.  T-max was 100.6 F.  He was achy.  Some chest congestion and wheezing.  No shortness of breath.  Although symptoms have improved and he just has minimal cough at this time.  His cervical spine surgery was canceled related to this.  He reports he needs a letter stating that he is good to go for surgery with regards to his prior illness.  Social History   Tobacco Use  Smoking Status Former Smoker  Smokeless Tobacco Never Used     ROS see history of present illness  Objective  Physical Exam Vitals:   04/29/17 0930  BP: (!) 180/110  Pulse: 72  Temp: 98.3 F (36.8 C)  SpO2: 98%    BP Readings from Last 3 Encounters:  04/29/17 (!) 180/110  04/23/17 124/78  04/13/17 (!) 145/89   Wt Readings from Last 3 Encounters:  04/29/17 215 lb 6.4 oz (97.7 kg)  04/23/17 215 lb 2 oz (97.6 kg)  04/13/17 220 lb (99.8 kg)    Physical Exam  Constitutional: No distress.  HENT:  Head: Normocephalic and atraumatic.  Mouth/Throat: Oropharynx is clear and moist. No oropharyngeal exudate.  Eyes: Conjunctivae are normal. Pupils are equal, round, and reactive to light.  Cardiovascular: Normal rate, regular rhythm and normal heart sounds.  Pulmonary/Chest: Effort normal and breath sounds normal.  Musculoskeletal: He exhibits no edema.  Neurological: He is alert. Gait normal.  Skin: Skin is warm and dry.  He is not diaphoretic.     Assessment/Plan: Please see individual problem list.  Essential hypertension Elevated today though fairly well controlled at home.  Suspect some measure of whitecoat hypertension.  He is currently back on lisinopril and losartan.  He notes he discuss this with his cardiologist and they did discuss risks with him and felt for his case it would be beneficial.  He will continue to monitor blood pressures.  Upper respiratory infection Patient with possible bronchitis or URI previously.  He has improved.  I think from this perspective he should be good to have his surgery.  A letter will be faxed to his neurosurgeons office.  DM (diabetes mellitus), type 2, uncontrolled w/ophthalmic complication Uncontrolled though is improving.  CBG was 115 this morning.  He will continue his current regimen.  Preop examination Patient presents for follow-up from his illness last week.  He has improved.  I think he is adequate from that perspective for his surgery.  His NSQIP risk is at least average if not above average for medical complications given his chronic medical issues with his blood pressure and diabetes requiring medication and insulin.  His most recent A1c is improving and his sugars have improved quite a bit.  A letter was created stating this.  I think at this point he is as optimized as we can get him currently for surgery.  Patient does understand that  he is at risk given his chronic medical issues.   Orders Placed This Encounter  Procedures  . Basic Metabolic Panel (BMET)    No orders of the defined types were placed in this encounter.    Chase Rumps, MD [place]

## 2017-04-29 NOTE — Assessment & Plan Note (Signed)
Patient with possible bronchitis or URI previously.  He has improved.  I think from this perspective he should be good to have his surgery.  A letter will be faxed to his neurosurgeons office.

## 2017-04-29 NOTE — Assessment & Plan Note (Signed)
Patient presents for follow-up from his illness last week.  He has improved.  I think he is adequate from that perspective for his surgery.  His NSQIP risk is at least average if not above average for medical complications given his chronic medical issues with his blood pressure and diabetes requiring medication and insulin.  His most recent A1c is improving and his sugars have improved quite a bit.  A letter was created stating this.  I think at this point he is as optimized as we can get him currently for surgery.  Patient does understand that he is at risk given his chronic medical issues.

## 2017-04-29 NOTE — Patient Instructions (Signed)
Nice to see you. We will check lab work today and contact you with the results. I think you are good to go for your surgery with regards to your prior illness.

## 2017-04-29 NOTE — ED Triage Notes (Signed)
Pt seen at [facility] today and sent to er for eval of elevated BUN and creatinine   Diabetic.  No vomiting.   Diarrhea for past 3 days.  Pt alert.

## 2017-04-29 NOTE — Assessment & Plan Note (Signed)
Uncontrolled though is improving.  CBG was 115 this morning.  He will continue his current regimen.

## 2017-04-30 ENCOUNTER — Encounter: Payer: Self-pay | Admitting: General Practice

## 2017-04-30 ENCOUNTER — Ambulatory Visit (HOSPITAL_COMMUNITY): Admission: RE | Admit: 2017-04-30 | Payer: 59 | Source: Ambulatory Visit | Admitting: Neurosurgery

## 2017-04-30 ENCOUNTER — Other Ambulatory Visit: Payer: Self-pay

## 2017-04-30 ENCOUNTER — Encounter (HOSPITAL_COMMUNITY): Admission: RE | Payer: Self-pay | Source: Ambulatory Visit

## 2017-04-30 ENCOUNTER — Telehealth: Payer: Self-pay

## 2017-04-30 DIAGNOSIS — N179 Acute kidney failure, unspecified: Secondary | ICD-10-CM

## 2017-04-30 DIAGNOSIS — I10 Essential (primary) hypertension: Secondary | ICD-10-CM | POA: Diagnosis not present

## 2017-04-30 DIAGNOSIS — R809 Proteinuria, unspecified: Secondary | ICD-10-CM | POA: Diagnosis not present

## 2017-04-30 DIAGNOSIS — E119 Type 2 diabetes mellitus without complications: Secondary | ICD-10-CM | POA: Diagnosis not present

## 2017-04-30 DIAGNOSIS — N19 Unspecified kidney failure: Secondary | ICD-10-CM | POA: Diagnosis present

## 2017-04-30 DIAGNOSIS — N182 Chronic kidney disease, stage 2 (mild): Secondary | ICD-10-CM | POA: Diagnosis not present

## 2017-04-30 LAB — BASIC METABOLIC PANEL
Anion gap: 10 (ref 5–15)
BUN: 29 mg/dL — ABNORMAL HIGH (ref 6–20)
CO2: 25 mmol/L (ref 22–32)
Calcium: 9.1 mg/dL (ref 8.9–10.3)
Chloride: 102 mmol/L (ref 101–111)
Creatinine, Ser: 1.83 mg/dL — ABNORMAL HIGH (ref 0.61–1.24)
GFR calc Af Amer: 47 mL/min — ABNORMAL LOW (ref >60)
GFR calc non Af Amer: 40 mL/min — ABNORMAL LOW (ref >60)
Glucose, Bld: 181 mg/dL — ABNORMAL HIGH (ref 65–99)
Potassium: 3.9 mmol/L (ref 3.5–5.1)
Sodium: 137 mmol/L (ref 135–145)

## 2017-04-30 LAB — CBC
HCT: 29.8 % — ABNORMAL LOW (ref 40.0–52.0)
Hemoglobin: 10.1 g/dL — ABNORMAL LOW (ref 13.0–18.0)
MCH: 28.7 pg (ref 26.0–34.0)
MCHC: 33.9 g/dL (ref 32.0–36.0)
MCV: 84.7 fL (ref 80.0–100.0)
Platelets: 238 10*3/uL (ref 150–440)
RBC: 3.52 MIL/uL — ABNORMAL LOW (ref 4.40–5.90)
RDW: 13.6 % (ref 11.5–14.5)
WBC: 9 10*3/uL (ref 3.8–10.6)

## 2017-04-30 LAB — GLUCOSE, CAPILLARY
Glucose-Capillary: 140 mg/dL — ABNORMAL HIGH (ref 65–99)
Glucose-Capillary: 212 mg/dL — ABNORMAL HIGH (ref 65–99)
Glucose-Capillary: 248 mg/dL — ABNORMAL HIGH (ref 65–99)

## 2017-04-30 LAB — PROTEIN / CREATININE RATIO, URINE
Creatinine, Urine: 219 mg/dL
Protein Creatinine Ratio: 2.05 mg/mg{Cre} — ABNORMAL HIGH (ref 0.00–0.15)
Total Protein, Urine: 450 mg/dL

## 2017-04-30 SURGERY — ANTERIOR CERVICAL DECOMPRESSION/DISCECTOMY FUSION 1 LEVEL
Anesthesia: General

## 2017-04-30 MED ORDER — DIFLUPREDNATE 0.05 % OP EMUL: 1.0000 [drp] | Freq: Two times a day (BID) | OPHTHALMIC | Status: DC

## 2017-04-30 MED ORDER — ACETAMINOPHEN 650 MG RE SUPP: 650.0000 mg | Freq: Four times a day (QID) | RECTAL | Status: DC | PRN

## 2017-04-30 MED ORDER — ASPIRIN EC 81 MG PO TBEC
81.0000 mg | DELAYED_RELEASE_TABLET | Freq: Every day | ORAL | Status: DC
Start: 2017-04-30 — End: 2017-04-30
  Administered 2017-04-30: 81 mg via ORAL
  Filled 2017-04-30: qty 1

## 2017-04-30 MED ORDER — BISACODYL 5 MG PO TBEC: 5.0000 mg | DELAYED_RELEASE_TABLET | Freq: Every day | ORAL | Status: DC | PRN

## 2017-04-30 MED ORDER — METOPROLOL TARTRATE 25 MG PO TABS
37.5000 mg | ORAL_TABLET | Freq: Two times a day (BID) | ORAL | Status: DC
  Filled 2017-04-30: qty 2

## 2017-04-30 MED ORDER — HYDROCODONE-ACETAMINOPHEN 5-325 MG PO TABS
1.0000 | ORAL_TABLET | ORAL | Status: DC | PRN
Start: 2017-04-30 — End: 2017-04-30
  Administered 2017-04-30: 2 via ORAL
  Filled 2017-04-30: qty 2

## 2017-04-30 MED ORDER — HYDROCHLOROTHIAZIDE 25 MG PO TABS
25.0000 mg | ORAL_TABLET | Freq: Every day | ORAL | Status: DC
  Administered 2017-04-30: 25 mg via ORAL
  Filled 2017-04-30: qty 1

## 2017-04-30 MED ORDER — GABAPENTIN 300 MG PO CAPS
600.0000 mg | ORAL_CAPSULE | Freq: Two times a day (BID) | ORAL | Status: DC
  Administered 2017-04-30 (×2): 600 mg via ORAL
  Filled 2017-04-30 (×2): qty 2

## 2017-04-30 MED ORDER — LOSARTAN POTASSIUM 50 MG PO TABS
100.0000 mg | ORAL_TABLET | Freq: Every day | ORAL | Status: DC
  Administered 2017-04-30: 100 mg via ORAL
  Filled 2017-04-30: qty 2

## 2017-04-30 MED ORDER — DOCUSATE SODIUM 100 MG PO CAPS
100.0000 mg | ORAL_CAPSULE | Freq: Two times a day (BID) | ORAL | Status: DC
  Administered 2017-04-30: 100 mg via ORAL
  Filled 2017-04-30: qty 1

## 2017-04-30 MED ORDER — ONDANSETRON HCL 4 MG/2ML IJ SOLN: 4.0000 mg | Freq: Four times a day (QID) | INTRAMUSCULAR | Status: DC | PRN

## 2017-04-30 MED ORDER — BRIMONIDINE TARTRATE-TIMOLOL 0.2-0.5 % OP SOLN: 1.0000 [drp] | Freq: Two times a day (BID) | OPHTHALMIC | Status: DC

## 2017-04-30 MED ORDER — ADULT MULTIVITAMIN W/MINERALS CH
1.0000 | ORAL_TABLET | Freq: Every day | ORAL | Status: DC
  Administered 2017-04-30: 1 via ORAL
  Filled 2017-04-30: qty 1

## 2017-04-30 MED ORDER — LOSARTAN POTASSIUM-HCTZ 100-25 MG PO TABS: 1.0000 | ORAL_TABLET | Freq: Every day | ORAL | Status: DC

## 2017-04-30 MED ORDER — TIMOLOL MALEATE 0.5 % OP SOLN
1.0000 [drp] | Freq: Two times a day (BID) | OPHTHALMIC | Status: DC
  Administered 2017-04-30: 1 [drp] via OPHTHALMIC
  Filled 2017-04-30: qty 5

## 2017-04-30 MED ORDER — HEPARIN SODIUM (PORCINE) 5000 UNIT/ML IJ SOLN
5000.0000 [IU] | Freq: Three times a day (TID) | INTRAMUSCULAR | Status: DC
  Administered 2017-04-30: 5000 [IU] via SUBCUTANEOUS
  Filled 2017-04-30 (×2): qty 1

## 2017-04-30 MED ORDER — ROSUVASTATIN CALCIUM 10 MG PO TABS
20.0000 mg | ORAL_TABLET | Freq: Every day | ORAL | Status: DC
  Administered 2017-04-30: 20 mg via ORAL
  Filled 2017-04-30: qty 2

## 2017-04-30 MED ORDER — ONDANSETRON HCL 4 MG PO TABS: 4.0000 mg | ORAL_TABLET | Freq: Four times a day (QID) | ORAL | Status: DC | PRN

## 2017-04-30 MED ORDER — BRIMONIDINE TARTRATE 0.2 % OP SOLN
1.0000 [drp] | Freq: Two times a day (BID) | OPHTHALMIC | Status: DC
  Administered 2017-04-30: 1 [drp] via OPHTHALMIC
  Filled 2017-04-30: qty 5

## 2017-04-30 MED ORDER — INSULIN GLARGINE 100 UNIT/ML ~~LOC~~ SOLN
30.0000 [IU] | Freq: Every day | SUBCUTANEOUS | Status: DC
  Administered 2017-04-30: 30 [IU] via SUBCUTANEOUS
  Filled 2017-04-30: qty 0.3

## 2017-04-30 MED ORDER — INSULIN ASPART 100 UNIT/ML ~~LOC~~ SOLN
0.0000 [IU] | Freq: Three times a day (TID) | SUBCUTANEOUS | Status: DC
  Administered 2017-04-30 (×2): 3 [IU] via SUBCUTANEOUS
  Filled 2017-04-30 (×2): qty 1

## 2017-04-30 MED ORDER — PANTOPRAZOLE SODIUM 40 MG PO TBEC
40.0000 mg | DELAYED_RELEASE_TABLET | Freq: Every day | ORAL | Status: DC
  Administered 2017-04-30: 40 mg via ORAL
  Filled 2017-04-30: qty 1

## 2017-04-30 MED ORDER — LATANOPROST 0.005 % OP SOLN
1.0000 [drp] | Freq: Every day | OPHTHALMIC | Status: DC
  Filled 2017-04-30: qty 2.5

## 2017-04-30 MED ORDER — ACETAMINOPHEN 325 MG PO TABS: 650.0000 mg | ORAL_TABLET | Freq: Four times a day (QID) | ORAL | Status: DC | PRN

## 2017-04-30 NOTE — ED Notes (Signed)
Gave pt food tray. 

## 2017-04-30 NOTE — Consult Note (Signed)
Date: 04/30/2017                  Patient Name:  Chase Clark  MRN: [id number]  DOB: 04-05-1966  Age / Sex: 52 y.o., male         PCP: [name], MD                 Service Requesting Consult: im/ Henreitta Leber, MD                 Reason for Consult:  Acute renal failure            History of Present Illness: Patient is a 52 y.o. male with medical problems of diabetes type 2 greater than 30 years, complicated by diabetic retinopathy, neuropathy, fatty liver, coronary disease, diabetic foot ulcer, who was admitted to [facility] on 04/29/2017 for evaluation of Abnormal lab results showing acute kidney injury.    Patient reports that about a month ago he had preop cardiology evaluation.  He underwent a stress test which was okay.  His blood pressure medications were switched around.  Carvedilol was discontinued and patient was started on lisinopril.  His blood pressure control has been fluctuating.  Patient was then started on Lasix and Lopressor.  Routine lab tests were done which showed that his serum creatinine had increased from baseline.  Therefore he was referred to the emergency room for further evaluation  His baseline creatinine appears to be 1.32 from December 21.  At the time of this admission, creatinine was 2.10.  It has steadily improved and is down to 1.83 today  Renal ultrasound was done.  Results are noted below  Results for LYDEN, REDNER (MRN [id number]) as of 04/30/2017 14:12  Ref. Range 01/06/2014 08:43 05/18/2016 11:12 07/09/2016 08:25 10/10/2016 10:46 01/13/2017 11:31 04/10/2017 08:31  Hemoglobin A1C Latest Ref Range: 4.8 - 5.6 % 13.0 (H) 15.3 (H) 8.3 (H) 7.3 14.5 9.9 (H)    Medications: Outpatient medications: Medications Prior to Admission  Medication Sig Dispense Refill Last Dose  . acetaminophen (TYLENOL) 500 MG tablet Take 1,000 mg by mouth every 8 (eight) hours as needed for mild pain or headache.   prn at prn  . aspirin EC 81 MG tablet Take 81 mg by  mouth daily.   04/29/2017 at Unknown time  . brimonidine-timolol (COMBIGAN) 0.2-0.5 % ophthalmic solution Place 1 drop into the right eye every 12 (twelve) hours.    04/29/2017 at Unknown time  . DUREZOL 0.05 % EMUL APPLY 1 DROP(S) IN LEFT EYE 2 TIMES A DAY  1 04/29/2017 at Unknown time  . esomeprazole (NEXIUM) 40 MG capsule Take 1 capsule (40 mg total) by mouth daily. (Patient taking differently: Take 40 mg by mouth daily. )   04/29/2017 at Unknown time  . gabapentin (NEURONTIN) 300 MG capsule Take 2 capsules (600 mg total) by mouth 2 (two) times daily. 360 capsule 3 04/29/2017 at Unknown time  . insulin aspart (NOVOLOG FLEXPEN) 100 UNIT/ML FlexPen Inject 10 units with breakfast, 10-15 units with lunch, and 15-20 units with supper (Patient taking differently: Inject 15-20 Units into the skin 3 (three) times daily with meals. Per sliding scale) 5 pen 11 04/29/2017 at Unknown time  . insulin glargine (LANTUS) 100 UNIT/ML injection Inject 0.45 mLs (45 Units total) into the skin daily. (Patient taking differently: Inject 50 Units into the skin daily. Takes in the morning) 40 mL 3 04/29/2017 at Unknown time  . Insulin Pen Needle (PEN  NEEDLES) 32G X 4 MM MISC Inject 90 each 3 (three) times daily into the skin. 100 each 11 04/29/2017 at Unknown time  . latanoprost (XALATAN) 0.005 % ophthalmic solution Place 1 drop into both eyes at bedtime.  5 04/28/2017 at Unknown time  . losartan-hydrochlorothiazide (HYZAAR) 100-25 MG tablet Take 1 tablet by mouth daily.  11 04/29/2017 at Unknown time  . metFORMIN (GLUCOPHAGE) 1000 MG tablet Take 1 tablet (1,000 mg total) by mouth 2 (two) times daily with a meal. 180 tablet 3 04/29/2017 at Unknown time  . metoprolol tartrate (LOPRESSOR) 50 MG tablet Take 37.5 mg by mouth 2 (two) times daily.   04/29/2017 at Unknown time  . Multiple Vitamin (MULTIVITAMIN WITH MINERALS) TABS tablet Take 1 tablet by mouth daily.   04/29/2017 at Unknown time  . Probiotic Product (PROBIOTIC PO) Take 1 tablet  by mouth daily.   04/29/2017 at Unknown time  . rosuvastatin (CRESTOR) 20 MG tablet Take 1 tablet (20 mg total) daily by mouth. 30 tablet 11 04/29/2017 at Unknown time  . furosemide (LASIX) 40 MG tablet Take 40 mg by mouth daily.    Not Taking at Unknown time  . lisinopril (PRINIVIL,ZESTRIL) 40 MG tablet Take 40 mg by mouth daily.   Not Taking at Unknown time    Current medications: Current Facility-Administered Medications  Medication Dose Route Frequency Provider Last Rate Last Dose  . acetaminophen (TYLENOL) tablet 650 mg  650 mg Oral Q6H PRN Amelia Jo, MD       Or  . acetaminophen (TYLENOL) suppository 650 mg  650 mg Rectal Q6H PRN Amelia Jo, MD      . aspirin EC tablet 81 mg  81 mg Oral Daily Amelia Jo, MD   81 mg at 04/30/17 1109  . bisacodyl (DULCOLAX) EC tablet 5 mg  5 mg Oral Daily PRN Amelia Jo, MD      . brimonidine (ALPHAGAN) 0.2 % ophthalmic solution 1 drop  1 drop Right Eye Q12H Amelia Jo, MD   1 drop at 04/30/17 1114   And  . timolol (TIMOPTIC) 0.5 % ophthalmic solution 1 drop  1 drop Right Eye Q12H Amelia Jo, MD   1 drop at 04/30/17 1113  . Difluprednate 0.05 % EMUL 1 drop  1 drop Left Eye BID Amelia Jo, MD      . docusate sodium (COLACE) capsule 100 mg  100 mg Oral BID Amelia Jo, MD   100 mg at 04/30/17 1106  . gabapentin (NEURONTIN) capsule 600 mg  600 mg Oral BID Amelia Jo, MD   600 mg at 04/30/17 1106  . heparin injection 5,000 Units  5,000 Units Subcutaneous Q8H Amelia Jo, MD   5,000 Units at 04/30/17 0151  . losartan (COZAAR) tablet 100 mg  100 mg Oral Daily Amelia Jo, MD   100 mg at 04/30/17 1005   And  . hydrochlorothiazide (HYDRODIURIL) tablet 25 mg  25 mg Oral Daily Amelia Jo, MD   25 mg at 04/30/17 1006  . HYDROcodone-acetaminophen (NORCO/VICODIN) 5-325 MG per tablet 1-2 tablet  1-2 tablet Oral Q4H PRN Amelia Jo, MD   2 tablet at 04/30/17 0151  . insulin aspart (novoLOG) injection 0-9 Units  0-9 Units Subcutaneous  TID WC Amelia Jo, MD   3 Units at 04/30/17 0857  . insulin glargine (LANTUS) injection 30 Units  30 Units Subcutaneous Daily Amelia Jo, MD   30 Units at 04/30/17 1108  . latanoprost (XALATAN) 0.005 % ophthalmic solution 1 drop  1 drop  Both Eyes QHS Amelia Jo, MD      . metoprolol tartrate (LOPRESSOR) tablet 37.5 mg  37.5 mg Oral BID Amelia Jo, MD      . multivitamin with minerals tablet 1 tablet  1 tablet Oral Daily Amelia Jo, MD   1 tablet at 04/30/17 1109  . ondansetron (ZOFRAN) tablet 4 mg  4 mg Oral Q6H PRN Amelia Jo, MD       Or  . ondansetron [facility]) injection 4 mg  4 mg Intravenous Q6H PRN Amelia Jo, MD      . pantoprazole (PROTONIX) EC tablet 40 mg  40 mg Oral Daily Amelia Jo, MD   40 mg at 04/30/17 1106  . rosuvastatin (CRESTOR) tablet 20 mg  20 mg Oral Daily Amelia Jo, MD   20 mg at 04/30/17 1108      Allergies: Allergies  Allergen Reactions  . Atorvastatin Other (See Comments)    Myalgias       Past Medical History: Past Medical History:  Diagnosis Date  . Arthritis   . Asthma    as child  . Coronary artery disease    DES DIAG1 11/20/09 [facility])  . Diabetes mellitus without complication ([place])   . Edema, lower extremity   . Fatty liver   . GERD (gastroesophageal reflux disease)   . Glaucoma   . Heart disease 2011   Patient has stent for 80% blockage  . Hyperlipidemia   . Hypertension   . Neuropathy   . Seasonal allergies   . Shoulder pain   . Ulcer of foot ([place]) 08.06.2014   DIABETIC ULCERATIONS ASSOCIATED WITH IRRITATION LATERAL ANKLE LEFT GREATER THAN RIGHT WITH MILD CELLULITIS     Past Surgical History: Past Surgical History:  Procedure Laterality Date  . ABDOMINAL AORTOGRAM N/A 05/20/2016   Procedure: Abdominal Aortogram possible intervention;  Surgeon: Katha Cabal, MD;  Location: [facility];  Service: Cardiovascular;  Laterality: N/A;  . CARDIAC CATHETERIZATION    . CATARACT EXTRACTION W/ INTRAOCULAR  LENS IMPLANT    . CATARACT EXTRACTION W/PHACO Left 02/26/2017   Procedure: CATARACT EXTRACTION PHACO AND INTRAOCULAR LENS PLACEMENT (IOC);  Surgeon: Eulogio Bear, MD;  Location: [facility] ORS;  Service: Ophthalmology;  Laterality: Left;  Lot # [id number] H [place]: 00:25.3 AP%: 6.2 CDE: 1.59   . CHOLECYSTECTOMY N/A   . CORONARY ANGIOPLASTY WITH STENT PLACEMENT  2007   1st diagonal  . EPIBLEPHERON REPAIR WITH TEAR DUCT PROBING    . EYE SURGERY Right sept 29n2015   cataract extraction  . INSERTION EXPRESS TUBE SHUNT Right 09/06/2015   Procedure: INSERTION AHMED TUBE SHUNT with tutoplast allograft;  Surgeon: Eulogio Bear, MD;  Location: [facility] ORS;  Service: Ophthalmology;  Laterality: Right;  Marland Kitchen VASECTOMY       Family History: Family History  Problem Relation Age of Onset  . Hyperlipidemia Mother   . Diabetes Mother   . Liver disease Mother        NASH  . Hyperlipidemia Father   . Diabetes Father   . Heart disease Father   . Hypertension Father      Social History: Social History   Socioeconomic History  . Marital status: Married    Spouse name: Not on file  . Number of children: Not on file  . Years of education: Not on file  . Highest education level: Not on file  Social Needs  . Financial resource strain: Not on file  . Food insecurity - worry: Not on file  .  Food insecurity - inability: Not on file  . Transportation needs - medical: Not on file  . Transportation needs - non-medical: Not on file  Occupational History  . Not on file  Tobacco Use  . Smoking status: Former Research scientist (life sciences)  . Smokeless tobacco: Never Used  Substance and Sexual Activity  . Alcohol use: Yes    Comment: OCCAS  . Drug use: No  . Sexual activity: Not on file  Other Topics Concern  . Not on file  Social History Narrative   Used to work as paramedic      Review of Systems: Gen: No fevers or chills, no weight loss or weight gain HEENT: Diabetic retinopathy, recent sinus infection CV: No chest  pain or shortness of breath at present Resp: No cough or sputum production GI: No nausea or vomiting.  Appetite is good GU : No blood in the urine, no history of kidney stones MS: Left foot diabetic ulcer, managed at the wound Center at [place]:  No acute complaints Psych: No complaints Heme: No complaints Neuro: No complaint Endocrine.  Previously poorly controlled diabetes  Vital Signs: Blood pressure 111/73, pulse 74, temperature 98.9 F (37.2 C), temperature source Oral, resp. rate 16, height 5\' 10"  (1.778 m), weight 100.1 kg (220 lb 9.6 oz), SpO2 97 %.   Intake/Output Summary (Last 24 hours) at 04/30/2017 1130 Last data filed at 04/30/2017 1055 Gross per 24 hour  Intake 360 ml  Output -  Net 360 ml    Weight trends: Filed Weights   04/29/17 1904 04/30/17 0543  Weight: 97.5 kg (215 lb) 100.1 kg (220 lb 9.6 oz)    Physical Exam: General:  No acute distress, laying in the bed  HEENT  anicteric, moist oral mucous membranes  Neck:  Supple, no masses  Lungs:  Normal breathing effort, clear to auscultation  Heart::  Regular rhythm, no rub or gallop  Abdomen:  Soft, nontender, non-distended   Extremities:  left foot bandage, Right 1+ edema  Neurologic:  Alert, oriented  Skin:  No acute rashes             Lab results: Basic Metabolic Panel: Recent Labs  Lab 04/29/17 0950 04/29/17 1905 04/30/17 0540  NA 138 139 137  K 4.3 4.5 3.9  CL 103 105 102  CO2 27 25 25   GLUCOSE 95 69 181*  BUN 33* 37* 29*  CREATININE 2.10* 2.01* 1.83*  CALCIUM 9.2 9.2 9.1    Liver Function Tests: Recent Labs  Lab 04/29/17 1905  AST 27  ALT 16*  ALKPHOS 70  BILITOT 0.5  PROT 7.2  ALBUMIN 3.6   No results for input(s): LIPASE, AMYLASE in the last 168 hours. No results for input(s): AMMONIA in the last 168 hours.  CBC: Recent Labs  Lab 04/29/17 1905 04/30/17 0540  WBC 10.1 9.0  HGB 10.8* 10.1*  HCT 31.6* 29.8*  MCV 84.1 84.7  PLT 280 238    Cardiac  Enzymes: No results for input(s): CKTOTAL, TROPONINI in the last 168 hours.  BNP: Invalid input(s): POCBNP  CBG: Recent Labs  Lab 04/30/17 0133 04/30/17 0842  GLUCAP 140* 212*    Microbiology: Recent Results (from the past 720 hour(s))  Surgical pcr screen     Status: None   Collection Time: 04/10/17  8:04 AM  Result Value Ref Range Status   MRSA, PCR NEGATIVE NEGATIVE Final   Staphylococcus aureus NEGATIVE NEGATIVE Final    Comment: (NOTE) The Xpert SA Assay (FDA approved  for NASAL specimens in patients 82 years of age and older), is one component of a comprehensive surveillance program. It is not intended to diagnose infection nor to guide or monitor treatment.      Coagulation Studies: No results for input(s): LABPROT, INR in the last 72 hours.  Urinalysis: Recent Labs    04/29/17 [place] 1.018  PHURINE 5.0  GLUCOSEU NEGATIVE  HGBUR SMALL*  [place] NEGATIVE  PROTEINUR >=300*  NITRITE NEGATIVE  LEUKOCYTESUR NEGATIVE        Imaging: US Renal  Result Date: 04/29/2017 CLINICAL DATA:  Acute kidney injury.  Protein urea. EXAM: RENAL / URINARY TRACT ULTRASOUND COMPLETE COMPARISON:  None. FINDINGS: Right Kidney: Length: 12.0 cm. Mildly echogenic right renal parenchyma, which is normal in thickness. No right hydronephrosis. No right renal mass. Left Kidney: Length: 12.7 cm. Mildly echogenic left renal parenchyma, which is normal in thickness. No left hydronephrosis. No left renal mass. Bladder: Appears normal for degree of bladder distention. Ureteral jets are demonstrated bilaterally in the bladder. IMPRESSION: 1. No hydronephrosis. 2. Normal size echogenic kidneys, indicative of nonspecific renal parenchymal disease of uncertain chronicity. 3. Normal bladder. Electronically Signed   By: Ilona Sorrel M.D.   On: 04/29/2017 21:52      Assessment & Plan: Pt is a 52 y.o. Caucasian  male with medical problems of type  2 diabetes for 30 years , now insulin-dependent, with complications of diabetic retinopathy,  neuropathy, foot ulcer, coronary disease with previous coronary stent was admitted on 04/29/2017 with abnormal renal labs.   1.  Acute kidney injury on CKD 2 2.  Proteinuria 3.  Diabetes with chronic kidney disease  Patient has multiple complications from long-standing diabetes including retinopathy, neuropathy and diabetic foot ulcer.  His proteinuria is most likely secondary to diabetic kidney disease.  Acute kidney injury is likely secondary to volume changes in the setting of ACE inhibitor, ARB and diuretics.  Plan: Recommended to patient to practice meticulous control try to achieve meticulous control of diabetes and hypertension Avoid eating out in restaurants Follow low-salt, and carb modified diet For now, blood pressure is controlled with current regimen of losartan, HCTZ and Lopressor. Continue current regimen. We will follow-up with patient in 1-2 weeks as outpatient.  In the office.

## 2017-04-30 NOTE — ED Notes (Signed)
Patient transported to 149

## 2017-04-30 NOTE — Progress Notes (Signed)
Inpatient Diabetes Program Recommendations  AACE/ADA: New Consensus Statement on Inpatient Glycemic Control (2015)  Target Ranges:  Prepandial:   less than 140 mg/dL      Peak postprandial:   less than 180 mg/dL (1-2 hours)      Critically ill patients:  140 - 180 mg/dL   Lab Results  Component Value Date   GLUCAP 248 (H) 04/30/2017   HGBA1C 9.9 (H) 04/10/2017    Review of Glycemic Control  Results for CAEDYN, RAYGOZA (MRN [id number]) as of 04/30/2017 13:23  Ref. Range 04/30/2017 01:33 04/30/2017 08:42 04/30/2017 12:38  Glucose-Capillary Latest Ref Range: 65 - 99 mg/dL 140 (H) 212 (H) 248 (H)    Diabetes history: Type 2 Outpatient Diabetes medications: Novolog 10 units with breakfast, 10-15 units with lunch and 15-20 units with supper, Lantus 50 units qam  Current orders for Inpatient glycemic control: Novolog 0-9 units tid, Lantus 30 units qhs  Inpatient Diabetes Program Recommendations: Consider adding Novolog 5 units tid with meals (hold if patient eats less than 50%), consider adding Novolog 0-5 units qhs   Gentry Fitz, RN, [place], [place], CDE Diabetes Coordinator Inpatient Diabetes Program  [phone] (Team Pager) [phone] ([place]) 04/30/2017 1:32 PM

## 2017-04-30 NOTE — Consult Note (Signed)
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  Seen at wound care center in [place].  Noted noncompliance with offloading.  Using hydrogel daily per MD notes, but patient relays that he is applying a dry dressing twice daily.  Wound to left dorsal foot in bend of foot , blistered Wound type:neuropathic wound, chronic Pressure Injury POA: Yes Measurement: 3 cm x 7.2 cm x 0.4 cm heel Left dorsal:  0.2 cm x 2 cm blister present Wound QBV:QXIH pink, nongranulating  Dressing had not been chagned since yesterday AM.  Drainage (amount, consistency, odor) minimal serosanguinous  No odor Periwound:intact Dressing procedure/placement/frequency: Cleanse wounds to left dorsal foot and left heel with NS.  Apply vaseline gauze to wound bed for atraumatic dressing removal.  Cover with 4x4 gauze and kerlix and tape.  Change daily.  Will not follow at this time.  Please re-consult if needed.  Domenic Moras RN BSN [place] Pager [phone]

## 2017-04-30 NOTE — Progress Notes (Deleted)
Notified by pharmacy to hold hydrochlorothiazide and losartan due to pt being in acute renal failure. Meds will be held.

## 2017-04-30 NOTE — Progress Notes (Signed)
Discharge note;  Discharge instructions given to pt. IV removed. Pt dressed. No further questions at this time. Pt ready for discharge.

## 2017-04-30 NOTE — Telephone Encounter (Signed)
Left message for patient to return call back to inform her that she will have to keep appointment on the [id number] due to [place] not having any available appointment spots.  Patient would need to be seen by someone else besides PCP.  PEC may give information.

## 2017-04-30 NOTE — Discharge Summary (Signed)
[place] [facility] [place] NAME: Chase Clark    MR#:  [id number]  DATE OF BIRTH:  1965/11/04  DATE OF ADMISSION:  04/29/2017 ADMITTING PHYSICIAN: Gearlean Alf, PA-C  DATE OF DISCHARGE: 04/30/2017  2:25 PM  PRIMARY CARE PHYSICIAN: [name], MD    ADMISSION DIAGNOSIS:  AKI (acute kidney injury) ([place]) [N17.9] Proteinuria, unspecified type [R80.9] Hypertension, unspecified type [I10]  DISCHARGE DIAGNOSIS:  Active Problems:   ARF (acute renal failure) ([place])   SECONDARY DIAGNOSIS:   Past Medical History:  Diagnosis Date  . Arthritis   . Asthma    as child  . Coronary artery disease    DES DIAG1 11/20/09 [facility])  . Diabetes mellitus without complication ([place])   . Edema, lower extremity   . Fatty liver   . GERD (gastroesophageal reflux disease)   . Glaucoma   . Heart disease 2011   Patient has stent for 80% blockage  . Hyperlipidemia   . Hypertension   . Neuropathy   . Seasonal allergies   . Shoulder pain   . Ulcer of foot ([place]) 08.06.2014   DIABETIC ULCERATIONS ASSOCIATED WITH IRRITATION LATERAL ANKLE LEFT GREATER THAN RIGHT WITH MILD CELLULITIS    HOSPITAL COURSE:   52 year old male with past medical history of essential hypertension, hyperlipidemia, neuropathy, glaucoma, GERD, coronary disease, diabetes, who presented to the hospital due to acute kidney injury which was noted on some preoperative blood work.  1. Acute renal failure - patient was seen by nephrology while in the hospital. Patient was noted to have proteinuria with acute on chronic renal failure. 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 -Patient's creatinine has improved since admission is currently stable. Patient will continue his losartan for his proteinuria. -Patient was advised for good control of his diabetes and hypertension. He was referred to nephrology as an outpatient.  2. Essential hypertension-patient has had some uncontrolled hypertension  and his blood pressure meds have been adjusted as an outpatient. He was recently started on Lasix and lisinopril which could've contributed to patient's worsening renal failure. -Patient presently is being discharged on metoprolol, losartan, HCTZ and patient's blood pressure has been controlled well in the hospital on this regimen.  3. Diabetes type 2 without complication-patient will continue his Lantus, NovoLog with meals. He was advised to hold his metformin until he follows up with nephrology.  4. Glaucoma-patient will continue his Combigan eyedrops  5. Hyperlipidemia-patient will continue his Crestor.  6. Left diabetic foot ulcer-patient will [facility]  DISCHARGE CONDITIONS:   Stable  CONSULTS OBTAINED:  Treatment Team:  Murlean Iba, MD  DRUG ALLERGIES:   Allergies  Allergen Reactions  . Atorvastatin Other (See Comments)    Myalgias     DISCHARGE MEDICATIONS:   Allergies as of 04/30/2017      Reactions   Atorvastatin Other (See Comments)   Myalgias      Medication List    STOP taking these medications   furosemide 40 MG tablet Commonly known as:  LASIX   lisinopril 40 MG tablet Commonly known as:  PRINIVIL,ZESTRIL   metFORMIN 1000 MG tablet Commonly known as:  GLUCOPHAGE     TAKE these medications   acetaminophen 500 MG tablet Commonly known as:  TYLENOL Take 1,000 mg by mouth every 8 (eight) hours as needed for mild pain or headache.   aspirin EC 81 MG tablet Take 81 mg by mouth daily.   COMBIGAN 0.2-0.5 % ophthalmic  solution Generic drug:  brimonidine-timolol Place 1 drop into the right eye every 12 (twelve) hours.   DUREZOL 0.05 % Emul Generic drug:  Difluprednate APPLY 1 DROP(S) IN LEFT EYE 2 TIMES A DAY   esomeprazole 40 MG capsule Commonly known as:  NEXIUM Take 1 capsule (40 mg total) by mouth daily.   gabapentin 300 MG capsule Commonly known as:  NEURONTIN Take 2 capsules (600 mg total) by mouth 2  (two) times daily.   insulin aspart 100 UNIT/ML FlexPen Commonly known as:  NOVOLOG FLEXPEN Inject 10 units with breakfast, 10-15 units with lunch, and 15-20 units with supper What changed:    how much to take  how to take this  when to take this  additional instructions   insulin glargine 100 UNIT/ML injection Commonly known as:  LANTUS Inject 0.45 mLs (45 Units total) into the skin daily. What changed:    how much to take  additional instructions   latanoprost 0.005 % ophthalmic solution Commonly known as:  XALATAN Place 1 drop into both eyes at bedtime.   losartan-hydrochlorothiazide 100-25 MG tablet Commonly known as:  HYZAAR Take 1 tablet by mouth daily.   metoprolol tartrate 50 MG tablet Commonly known as:  LOPRESSOR Take 37.5 mg by mouth 2 (two) times daily.   multivitamin with minerals Tabs tablet Take 1 tablet by mouth daily.   Pen Needles 32G X 4 MM Misc Inject 90 each 3 (three) times daily into the skin.   PROBIOTIC PO Take 1 tablet by mouth daily.   rosuvastatin 20 MG tablet Commonly known as:  CRESTOR Take 1 tablet (20 mg total) daily by mouth.         DISCHARGE INSTRUCTIONS:   DIET:  Cardiac diet and Diabetic diet  DISCHARGE CONDITION:  Stable  ACTIVITY:  Activity as tolerated  OXYGEN:  Home Oxygen: No.   Oxygen Delivery: room air  DISCHARGE LOCATION:  home   If you experience worsening of your admission symptoms, develop shortness of breath, life threatening emergency, suicidal or homicidal thoughts you must seek medical attention immediately by calling 911 or calling your MD immediately  if symptoms less severe.  You Must read complete instructions/literature along with all the possible adverse reactions/side effects for all the Medicines you take and that have been prescribed to you. Take any new Medicines after you have completely understood and accpet all the possible adverse reactions/side effects.   Please note  You  were cared for by a hospitalist during your hospital stay. If you have any questions about your discharge medications or the care you received while you were in the hospital after you are discharged, you can call the unit and asked to speak with the hospitalist on call if the hospitalist that took care of you is not available. Once you are discharged, your primary care physician will handle any further medical issues. Please note that NO REFILLS for any discharge medications will be authorized once you are discharged, as it is imperative that you return to your primary care physician (or establish a relationship with a primary care physician if you do not have one) for your aftercare needs so that they can reassess your need for medications and monitor your lab values.     Today   No acute complaints overnight, renal function has improved. Patient's wife is at bedside. Seen by nephrology and okay to be discharged home and follow up with them as outpatient.  VITAL SIGNS:  Blood pressure 111/73, pulse 74,  temperature 98.9 F (37.2 C), temperature source Oral, resp. rate 16, height 5\' 10"  (1.778 m), weight 100.1 kg (220 lb 9.6 oz), SpO2 97 %.  I/O:    Intake/Output Summary (Last 24 hours) at 04/30/2017 1620 Last data filed at 04/30/2017 1140 Gross per 24 hour  Intake 360 ml  Output 600 ml  Net -240 ml    PHYSICAL EXAMINATION:  GENERAL:  52 y.o.-year-old patient lying in the bed with no acute distress.  EYES: Pupils equal, round, reactive to light and accommodation. No scleral icterus. Extraocular muscles intact.  HEENT: Head atraumatic, normocephalic. Oropharynx and nasopharynx clear.  NECK:  Supple, no jugular venous distention. No thyroid enlargement, no tenderness.  LUNGS: Normal breath sounds bilaterally, no wheezing, rales,rhonchi. No use of accessory muscles of respiration.  CARDIOVASCULAR: S1, S2 normal. No murmurs, rubs, or gallops.  ABDOMEN: Soft, non-tender, non-distended. Bowel  sounds present. No organomegaly or mass.  EXTREMITIES: No pedal edema, cyanosis, or clubbing.  NEUROLOGIC: Cranial nerves II through XII are intact. No focal motor or sensory defecits b/l.  PSYCHIATRIC: The patient is alert and oriented x 3. Good affect.  SKIN: No obvious rash, lesion, Left Diabetic foot ulcer    DATA REVIEW:   CBC Recent Labs  Lab 04/30/17 0540  WBC 9.0  HGB 10.1*  HCT 29.8*  PLT 238    Chemistries  Recent Labs  Lab 04/29/17 1905 04/30/17 0540  NA 139 137  K 4.5 3.9  CL 105 102  CO2 25 25  GLUCOSE 69 181*  BUN 37* 29*  CREATININE 2.01* 1.83*  CALCIUM 9.2 9.1  AST 27  --   ALT 16*  --   ALKPHOS 70  --   BILITOT 0.5  --     Cardiac Enzymes No results for input(s): TROPONINI in the last 168 hours.  Microbiology Results  Results for orders placed or performed during the hospital encounter of 04/10/17  Surgical pcr screen     Status: None   Collection Time: 04/10/17  8:04 AM  Result Value Ref Range Status   MRSA, PCR NEGATIVE NEGATIVE Final   Staphylococcus aureus NEGATIVE NEGATIVE Final    Comment: (NOTE) The Xpert SA Assay (FDA approved for NASAL specimens in patients 75 years of age and older), is one component of a comprehensive surveillance program. It is not intended to diagnose infection nor to guide or monitor treatment.     RADIOLOGY:  US Renal  Result Date: 04/29/2017 CLINICAL DATA:  Acute kidney injury.  Protein urea. EXAM: RENAL / URINARY TRACT ULTRASOUND COMPLETE COMPARISON:  None. FINDINGS: Right Kidney: Length: 12.0 cm. Mildly echogenic right renal parenchyma, which is normal in thickness. No right hydronephrosis. No right renal mass. Left Kidney: Length: 12.7 cm. Mildly echogenic left renal parenchyma, which is normal in thickness. No left hydronephrosis. No left renal mass. Bladder: Appears normal for degree of bladder distention. Ureteral jets are demonstrated bilaterally in the bladder. IMPRESSION: 1. No hydronephrosis. 2.  Normal size echogenic kidneys, indicative of nonspecific renal parenchymal disease of uncertain chronicity. 3. Normal bladder. Electronically Signed   By: Ilona Sorrel M.D.   On: 04/29/2017 21:52      Management plans discussed with the patient, family and they are in agreement.  CODE STATUS:     Code Status Orders  (From admission, onward)        Start     Ordered   04/30/17 0127  Full code  Continuous     04/30/17 0126  Code Status History    Date Active Date Inactive Code Status Order ID Comments User Context   05/17/2016 19:44 05/21/2016 17:04 Full Code [id number]  Idelle Crouch, MD Inpatient    Advance Directive Documentation     Most Recent Value  Type of Advance Directive  Living will, Healthcare Power of Attorney  Pre-existing out of facility DNR order (yellow form or pink MOST form)  No data  "MOST" Form in Place?  No data      TOTAL TIME TAKING CARE OF THIS PATIENT: 40 minutes.    Henreitta Leber M.D on 04/30/2017 at 4:20 PM  Between 7am to 6pm - Pager - [phone]  After 6pm go to [email or web address] - [employer] [place] [facility]  Office  [phone]  CC: Primary care physician; [name], MD

## 2017-04-30 NOTE — H&P (Signed)
[place] at [place] NAME: Chase Clark    MR#:  [id number]  DATE OF BIRTH:  [birth year]  DATE OF ADMISSION:  04/29/2017  PRIMARY CARE PHYSICIAN: [name], MD   REQUESTING/REFERRING PHYSICIAN:   CHIEF COMPLAINT:   Chief Complaint  Patient presents with  . Abnormal Lab    HISTORY OF PRESENT ILLNESS: Chase Clark  is a 52 y.o. male with a known history of coronary artery disease, diabetes, asthma and arthritis. Patient was advised by PCP to present to emergency room, due to abnormal blood test that showed acute kidney failure.  The blood test was done during the preop evaluation for anterior cervical fusion.  His surgery is currently postponed due to uncontrolled blood pressure and abnormal kidney function test.  Per patient, his blood pressure has been elevated for the past few months, for which he was started on several blood pressure medications.  More recently, in the past 2 weeks, lisinopril and Lasix were added.  He has had bilateral lower extremities edema for the past few months as well.  He denies any chest pain or shortness of breath; no fever or chills.  He has had sinus congestion for the past week, for which he was treated with Z-Pak.  PAST MEDICAL HISTORY:   Past Medical History:  Diagnosis Date  . Arthritis   . Asthma    as child  . Coronary artery disease    DES DIAG1 11/20/09 [facility])  . Diabetes mellitus without complication ([place])   . Edema, lower extremity   . Fatty liver   . GERD (gastroesophageal reflux disease)   . Glaucoma   . Heart disease 2011   Patient has stent for 80% blockage  . Hyperlipidemia   . Hypertension   . Neuropathy   . Seasonal allergies   . Shoulder pain   . Ulcer of foot ([place]) 08.06.2014   DIABETIC ULCERATIONS ASSOCIATED WITH IRRITATION LATERAL ANKLE LEFT GREATER THAN RIGHT WITH MILD CELLULITIS    PAST SURGICAL HISTORY:  Past Surgical History:  Procedure Laterality Date  .  ABDOMINAL AORTOGRAM N/A 05/20/2016   Procedure: Abdominal Aortogram possible intervention;  Surgeon: Katha Cabal, MD;  Location: [place] [facility];  Service: Cardiovascular;  Laterality: N/A;  . CARDIAC CATHETERIZATION    . CATARACT EXTRACTION W/ INTRAOCULAR LENS IMPLANT    . CATARACT EXTRACTION W/PHACO Left 02/26/2017   Procedure: CATARACT EXTRACTION PHACO AND INTRAOCULAR LENS PLACEMENT (IOC);  Surgeon: Eulogio Bear, MD;  Location: [facility] ORS;  Service: Ophthalmology;  Laterality: Left;  Lot # [id number] H [place]: 00:25.3 AP%: 6.2 CDE: 1.59   . CHOLECYSTECTOMY N/A   . CORONARY ANGIOPLASTY WITH STENT PLACEMENT  2007   1st diagonal  . EPIBLEPHERON REPAIR WITH TEAR DUCT PROBING    . EYE SURGERY Right sept 29n2015   cataract extraction  . INSERTION EXPRESS TUBE SHUNT Right 09/06/2015   Procedure: INSERTION AHMED TUBE SHUNT with tutoplast allograft;  Surgeon: Eulogio Bear, MD;  Location: [facility] ORS;  Service: Ophthalmology;  Laterality: Right;  Marland Kitchen VASECTOMY      SOCIAL HISTORY:  Social History   Tobacco Use  . Smoking status: Former Research scientist (life sciences)  . Smokeless tobacco: Never Used  Substance Use Topics  . Alcohol use: Yes    Comment: OCCAS    FAMILY HISTORY:  Family History  Problem Relation Age of Onset  . Hyperlipidemia Mother   . Diabetes Mother   . Liver disease Mother  NASH  . Hyperlipidemia Father   . Diabetes Father   . Heart disease Father   . Hypertension Father     DRUG ALLERGIES:  Allergies  Allergen Reactions  . Atorvastatin Other (See Comments)    Myalgias     REVIEW OF SYSTEMS:   CONSTITUTIONAL: No fever, fatigue or weakness.  EYES: No blurred or double vision.  EARS, NOSE, AND THROAT: No tinnitus or ear pain.  RESPIRATORY: No cough, shortness of breath, wheezing or hemoptysis.  CARDIOVASCULAR: No chest pain, orthopnea; positive for bilateral lower extremities edema.  GASTROINTESTINAL: No nausea, vomiting, diarrhea or abdominal pain.   GENITOURINARY: No dysuria, hematuria.  ENDOCRINE: No polyuria, nocturia,  HEMATOLOGY: No anemia, easy bruising or bleeding SKIN: No rash or lesion. MUSCULOSKELETAL: No joint pain or arthritis.   NEUROLOGIC: No tingling, numbness, weakness.  PSYCHIATRY: No anxiety or depression.   MEDICATIONS AT HOME:  Prior to Admission medications   Medication Sig Start Date End Date Taking? Authorizing Provider  acetaminophen (TYLENOL) 500 MG tablet Take 1,000 mg by mouth every 8 (eight) hours as needed for mild pain or headache.   Yes [provider]  aspirin EC 81 MG tablet Take 81 mg by mouth daily.   Yes [provider]  brimonidine-timolol (COMBIGAN) 0.2-0.5 % ophthalmic solution Place 1 drop into the right eye every 12 (twelve) hours.    Yes [provider]  DUREZOL 0.05 % EMUL APPLY 1 DROP(S) IN LEFT EYE 2 TIMES A DAY 03/27/17  Yes [provider]  esomeprazole (NEXIUM) 40 MG capsule Take 1 capsule (40 mg total) by mouth daily. Patient taking differently: Take 40 mg by mouth daily.  05/21/16  Yes Wieting, Richard, MD  gabapentin (NEURONTIN) 300 MG capsule Take 2 capsules (600 mg total) by mouth 2 (two) times daily. 07/09/16  Yes [name], MD  insulin aspart (NOVOLOG FLEXPEN) 100 UNIT/ML FlexPen Inject 10 units with breakfast, 10-15 units with lunch, and 15-20 units with supper Patient taking differently: Inject 15-20 Units into the skin 3 (three) times daily with meals. Per sliding scale 02/23/17  Yes [name], MD  insulin glargine (LANTUS) 100 UNIT/ML injection Inject 0.45 mLs (45 Units total) into the skin daily. Patient taking differently: Inject 50 Units into the skin daily. Takes in the morning 10/10/16  Yes [name], MD  Insulin Pen Needle (PEN NEEDLES) 32G X 4 MM MISC Inject 90 each 3 (three) times daily into the skin. 02/23/17  Yes [name], MD  latanoprost (XALATAN) 0.005 % ophthalmic solution Place 1 drop into both eyes at  bedtime. 03/03/17  Yes [provider]  losartan-hydrochlorothiazide (HYZAAR) 100-25 MG tablet Take 1 tablet by mouth daily. 07/15/15  Yes [provider]  metFORMIN (GLUCOPHAGE) 1000 MG tablet Take 1 tablet (1,000 mg total) by mouth 2 (two) times daily with a meal. 07/09/16  Yes [name], MD  metoprolol tartrate (LOPRESSOR) 50 MG tablet Take 37.5 mg by mouth 2 (two) times daily.   Yes [provider]  Multiple Vitamin (MULTIVITAMIN WITH MINERALS) TABS tablet Take 1 tablet by mouth daily.   Yes [provider]  Probiotic Product (PROBIOTIC PO) Take 1 tablet by mouth daily.   Yes [provider]  rosuvastatin (CRESTOR) 20 MG tablet Take 1 tablet (20 mg total) daily by mouth. 02/23/17  Yes [name], MD  furosemide (LASIX) 40 MG tablet Take 40 mg by mouth daily.     [provider]  lisinopril (  PRINIVIL,ZESTRIL) 40 MG tablet Take 40 mg by mouth daily.    [provider]      PHYSICAL EXAMINATION:   VITAL SIGNS: Blood pressure 138/80, pulse 83, temperature 98.6 F (37 C), temperature source Oral, resp. rate 16, height 5\' 10"  (1.778 m), weight 97.5 kg (215 lb), SpO2 97 %.  GENERAL:  52 y.o.-year-old patient lying in the bed with no acute distress.  EYES: Pupils equal, round, reactive to light and accommodation. No scleral icterus. Extraocular muscles intact.  HEENT: Head atraumatic, normocephalic. Oropharynx and nasopharynx clear.  NECK:  Supple, no jugular venous distention. No thyroid enlargement, no tenderness.  LUNGS: Normal breath sounds bilaterally, no wheezing, rales,rhonchi or crepitation. No use of accessory muscles of respiration.  CARDIOVASCULAR: S1, S2 normal. No S3/S4.  ABDOMEN: Soft, nontender, nondistended. Bowel sounds present. No organomegaly or mass.  EXTREMITIES: 1+ bilateral pedal edema, up to the knees.  NEUROLOGIC: Cranial nerves II through XII are intact. Muscle strength 5/5 in all extremities.  Sensation intact. Gait not checked.  PSYCHIATRIC: The patient is alert and oriented x 3.  SKIN: No obvious rash, lesion, or ulcer.   LABORATORY PANEL:   CBC Recent Labs  Lab 04/29/17 1905  WBC 10.1  HGB 10.8*  HCT 31.6*  PLT 280  MCV 84.1  MCH 28.9  MCHC 34.3  RDW 13.6   ------------------------------------------------------------------------------------------------------------------  Chemistries  Recent Labs  Lab 04/29/17 0950 04/29/17 1905  NA 138 139  K 4.3 4.5  CL 103 105  CO2 27 25  GLUCOSE 95 69  BUN 33* 37*  CREATININE 2.10* 2.01*  CALCIUM 9.2 9.2  AST  --  27  ALT  --  16*  ALKPHOS  --  70  BILITOT  --  0.5   ------------------------------------------------------------------------------------------------------------------ estimated creatinine clearance is 49.8 mL/min (A) (by C-G formula based on SCr of 2.01 mg/dL (H)). ------------------------------------------------------------------------------------------------------------------ No results for input(s): TSH, T4TOTAL, T3FREE, THYROIDAB in the last 72 hours.  Invalid input(s): FREET3   Coagulation profile No results for input(s): INR, PROTIME in the last 168 hours. ------------------------------------------------------------------------------------------------------------------- No results for input(s): DDIMER in the last 72 hours. -------------------------------------------------------------------------------------------------------------------  Cardiac Enzymes No results for input(s): CKMB, TROPONINI, MYOGLOBIN in the last 168 hours.  Invalid input(s): CK ------------------------------------------------------------------------------------------------------------------ Invalid input(s): POCBNP  ---------------------------------------------------------------------------------------------------------------  Urinalysis    Component Value Date/Time   COLORURINE YELLOW (A) 04/29/2017 1907    APPEARANCEUR HAZY (A) 04/29/2017 1907   LABSPEC 1.018 04/29/2017 1907   PHURINE 5.0 04/29/2017 1907   GLUCOSEU NEGATIVE 04/29/2017 1907   HGBUR SMALL (A) 04/29/2017 1907   BILIRUBINUR NEGATIVE 04/29/2017 1907   KETONESUR NEGATIVE 04/29/2017 1907   PROTEINUR >=300 (A) 04/29/2017 1907   UROBILINOGEN 0.2 10/22/2006 0948   NITRITE NEGATIVE 04/29/2017 1907   LEUKOCYTESUR NEGATIVE 04/29/2017 1907     RADIOLOGY: US Renal  Result Date: 04/29/2017 CLINICAL DATA:  Acute kidney injury.  Protein urea. EXAM: RENAL / URINARY TRACT ULTRASOUND COMPLETE COMPARISON:  None. FINDINGS: Right Kidney: Length: 12.0 cm. Mildly echogenic right renal parenchyma, which is normal in thickness. No right hydronephrosis. No right renal mass. Left Kidney: Length: 12.7 cm. Mildly echogenic left renal parenchyma, which is normal in thickness. No left hydronephrosis. No left renal mass. Bladder: Appears normal for degree of bladder distention. Ureteral jets are demonstrated bilaterally in the bladder. IMPRESSION: 1. No hydronephrosis. 2. Normal size echogenic kidneys, indicative of nonspecific renal parenchymal disease of uncertain chronicity. 3. Normal bladder. Electronically Signed   By: Ilona Sorrel M.D.   On:  04/29/2017 21:52    EKG: Orders placed or performed in visit on 07/09/16  . EKG 12-Lead    IMPRESSION AND PLAN:  1.  Acute on chronic renal failure.  Creatinine level is 2.1 today, elevated from 1.32 weeks ago.  This changes likely related to medications lisinopril and Lasix, which were recently added.  Patient likely has some chronic kidney damage related to uncontrolled hypertension and diabetes. Will hold these 2 medications and continue to monitor kidney function closely.  Will avoid nephrotoxic medications. Nephrology will evaluate the patient as well. 2.  Uncontrolled hypertension, we will restart metoprolol hydrochlorothiazide and losartan.  We will continue to monitor closely and make further adjustments.   3.  Acute on chronic lower extremities edema.  It could be related to new onset CHF and worsened by acute renal failure.  We will check 2D echo and renal ultrasound. 4.  Diabetes type 2, continue insulin treatment and monitor blood sugars before meals and at bedtime. 5.  Cervical spine osteoarthritis, scheduled for cervical spine fusion.  The surgery is on hold for now, due to uncontrolled hypertension and worsening kidney function.  All the records are reviewed and case discussed with ED provider. Management plans discussed with the patient, family and they are in agreement.  CODE STATUS:    Code Status Orders  (From admission, onward)        Start     Ordered   04/30/17 0127  Full code  Continuous     04/30/17 0126    Code Status History    Date Active Date Inactive Code Status Order ID Comments User Context   05/17/2016 19:44 05/21/2016 17:04 Full Code [id number]  Idelle Crouch, MD Inpatient    Advance Directive Documentation     Most Recent Value  Type of Advance Directive  Living will, Healthcare Power of Attorney  Pre-existing out of facility DNR order (yellow form or pink MOST form)  No data  "MOST" Form in Place?  No data       TOTAL TIME TAKING CARE OF THIS PATIENT: 40 minutes.    Amelia Jo M.D on 04/30/2017 at 3:14 AM  Between 7am to 6pm - Pager - [phone]  After 6pm go to [email or web address] - password [email or web address] [facility]  [place] [facility]  Office  [phone]  CC: Primary care physician; [name], MD

## 2017-04-30 NOTE — Telephone Encounter (Signed)
Copied from [place] [phone]. Topic: Quick Communication - See Telephone Encounter >> Apr 30, 2017  9:59 AM Oneta Rack wrote: CRM for notification. See Telephone encounter for:   04/30/17   Relation to pt: spouse / Mylez, [place] Call back number: [phone]   Reason for call:  Patient last seen 04/29/17 by Dr. [name] and spouse scheduled follow up appointment for 05/08/17 at 1:45pm. Spouse would like patient seen by PCP sooner due to discussing patient BP medication, please advise >> Apr 30, 2017 10:01 AM Oneta Rack wrote: CRM for notification. See Telephone encounter for:   04/30/17   Relation to pt: spouse / Herchel, [place] Call back number: [phone]   Reason for call:  Patient last seen 04/29/17 by Dr. [name] and spouse scheduled follow up appointment for 05/08/17 at 1:45pm. Spouse would like patient seen by PCP sooner due to discussing patient BP medication, please advise   *pcp has no available appointments prior to 05/08/17

## 2017-05-01 ENCOUNTER — Telehealth: Payer: Self-pay

## 2017-05-01 ENCOUNTER — Ambulatory Visit: Payer: 59 | Admitting: Internal Medicine

## 2017-05-01 DIAGNOSIS — N179 Acute kidney failure, unspecified: Secondary | ICD-10-CM

## 2017-05-01 LAB — HIV ANTIBODY (ROUTINE TESTING W REFLEX): HIV Screen 4th Generation wRfx: NONREACTIVE

## 2017-05-01 NOTE — Telephone Encounter (Signed)
Please advise 

## 2017-05-01 NOTE — Telephone Encounter (Signed)
Pts wife called in because she received a call from the neurosurgeons office. Everything is good from other providers. patient is requesting a letter clearing him for surgery.  Send letter to Dr. Arnoldo Morale fax [phone]. Patients wife would like this done so they can have surgery next week. Please advise.

## 2017-05-03 NOTE — Telephone Encounter (Signed)
This letter has been created.  Please arrange for the patient to have follow-up renal function tested.  If stable or improved he should be good to go for surgery.  Order has been placed.  Thanks.

## 2017-05-03 NOTE — Addendum Note (Signed)
Addended by: [name] ERIC G on: 05/03/2017 11:47 AM   Modules accepted: Orders

## 2017-05-04 ENCOUNTER — Telehealth: Payer: Self-pay

## 2017-05-04 ENCOUNTER — Other Ambulatory Visit (INDEPENDENT_AMBULATORY_CARE_PROVIDER_SITE_OTHER): Payer: 59

## 2017-05-04 DIAGNOSIS — Z01818 Encounter for other preprocedural examination: Secondary | ICD-10-CM | POA: Diagnosis not present

## 2017-05-04 DIAGNOSIS — Z125 Encounter for screening for malignant neoplasm of prostate: Secondary | ICD-10-CM

## 2017-05-04 DIAGNOSIS — N179 Acute kidney failure, unspecified: Secondary | ICD-10-CM | POA: Diagnosis not present

## 2017-05-04 DIAGNOSIS — E781 Pure hyperglyceridemia: Secondary | ICD-10-CM | POA: Diagnosis not present

## 2017-05-04 LAB — CBC
HCT: 33.3 % — ABNORMAL LOW (ref 39.0–52.0)
Hemoglobin: 11.2 g/dL — ABNORMAL LOW (ref 13.0–17.0)
MCHC: 33.7 g/dL (ref 30.0–36.0)
MCV: 84.4 fl (ref 78.0–100.0)
Platelets: 259 10*3/uL (ref 150.0–400.0)
RBC: 3.94 Mil/uL — ABNORMAL LOW (ref 4.22–5.81)
RDW: 13.9 % (ref 11.5–15.5)
WBC: 14.1 10*3/uL — ABNORMAL HIGH (ref 4.0–10.5)

## 2017-05-04 LAB — LIPID PANEL
Cholesterol: 142 mg/dL (ref 0–200)
HDL: 38.6 mg/dL — ABNORMAL LOW (ref >39.00)
NonHDL: 103.08
Total CHOL/HDL Ratio: 4
Triglycerides: 237 mg/dL — ABNORMAL HIGH (ref 0.0–149.0)
VLDL: 47.4 mg/dL — ABNORMAL HIGH (ref 0.0–40.0)

## 2017-05-04 LAB — LDL CHOLESTEROL, DIRECT: Direct LDL: 75 mg/dL

## 2017-05-04 LAB — PSA: PSA: 0.16 ng/mL (ref 0.10–4.00)

## 2017-05-04 LAB — BASIC METABOLIC PANEL
BUN: 30 mg/dL — ABNORMAL HIGH (ref 6–23)
CO2: 22 mEq/L (ref 19–32)
Calcium: 9.5 mg/dL (ref 8.4–10.5)
Chloride: 99 mEq/L (ref 96–112)
Creatinine, Ser: 1.69 mg/dL — ABNORMAL HIGH (ref 0.40–1.50)
GFR: 45.21 mL/min — ABNORMAL LOW (ref >60.00)
Glucose, Bld: 380 mg/dL — ABNORMAL HIGH (ref 70–99)
Potassium: 4.7 mEq/L (ref 3.5–5.1)
Sodium: 135 mEq/L (ref 135–145)

## 2017-05-04 NOTE — Telephone Encounter (Signed)
Left message to return call, ok for pec to speak to patient or wife to schedule for lab appmt today or tomorrow

## 2017-05-04 NOTE — Telephone Encounter (Signed)
Copied from [place] [phone] [phone]. Topic: Appointment Scheduling - Scheduling Inquiry for Clinic >> May 04, 2017  9:41 AM Marin Olp L wrote: Patient is supposed to see [facility] Friday 05/08/2017 1:45pm for hosp f/u but wife wants to know if he can be seen sooner b/c he's supposed to be getting scheduled for outpatient surgery and his bp is still elevated and his medications have been changed. They also need surgical clearance.

## 2017-05-04 NOTE — Telephone Encounter (Signed)
Scheduled patient for labs and clearance, patient notified

## 2017-05-05 ENCOUNTER — Other Ambulatory Visit: Payer: Self-pay | Admitting: Neurosurgery

## 2017-05-05 ENCOUNTER — Ambulatory Visit (INDEPENDENT_AMBULATORY_CARE_PROVIDER_SITE_OTHER): Payer: 59 | Admitting: Family Medicine

## 2017-05-05 ENCOUNTER — Encounter: Payer: Self-pay | Admitting: Family Medicine

## 2017-05-05 VITALS — BP 164/90 | HR 70 | Temp 98.1°F | Wt 218.4 lb

## 2017-05-05 DIAGNOSIS — E1139 Type 2 diabetes mellitus with other diabetic ophthalmic complication: Secondary | ICD-10-CM | POA: Diagnosis not present

## 2017-05-05 DIAGNOSIS — D72829 Elevated white blood cell count, unspecified: Secondary | ICD-10-CM

## 2017-05-05 DIAGNOSIS — E1165 Type 2 diabetes mellitus with hyperglycemia: Secondary | ICD-10-CM | POA: Diagnosis not present

## 2017-05-05 DIAGNOSIS — J069 Acute upper respiratory infection, unspecified: Secondary | ICD-10-CM

## 2017-05-05 DIAGNOSIS — N179 Acute kidney failure, unspecified: Secondary | ICD-10-CM | POA: Diagnosis not present

## 2017-05-05 DIAGNOSIS — E11621 Type 2 diabetes mellitus with foot ulcer: Secondary | ICD-10-CM

## 2017-05-05 DIAGNOSIS — E1142 Type 2 diabetes mellitus with diabetic polyneuropathy: Secondary | ICD-10-CM | POA: Diagnosis not present

## 2017-05-05 DIAGNOSIS — L97425 Non-pressure chronic ulcer of left heel and midfoot with muscle involvement without evidence of necrosis: Secondary | ICD-10-CM | POA: Diagnosis not present

## 2017-05-05 DIAGNOSIS — I10 Essential (primary) hypertension: Secondary | ICD-10-CM | POA: Diagnosis not present

## 2017-05-05 DIAGNOSIS — IMO0002 Reserved for concepts with insufficient information to code with codable children: Secondary | ICD-10-CM

## 2017-05-05 DIAGNOSIS — E11610 Type 2 diabetes mellitus with diabetic neuropathic arthropathy: Secondary | ICD-10-CM | POA: Diagnosis not present

## 2017-05-05 DIAGNOSIS — L97422 Non-pressure chronic ulcer of left heel and midfoot with fat layer exposed: Secondary | ICD-10-CM | POA: Diagnosis not present

## 2017-05-05 MED ORDER — METOPROLOL TARTRATE 25 MG PO TABS: 75.0000 mg | ORAL_TABLET | Freq: Two times a day (BID) | ORAL | 4 refills | Status: DC

## 2017-05-05 MED ORDER — DOXYCYCLINE HYCLATE 100 MG PO TABS
100.0000 mg | ORAL_TABLET | Freq: Two times a day (BID) | ORAL | 0 refills | Status: DC
Start: 2017-05-05 — End: 2017-07-09

## 2017-05-05 MED ORDER — AMLODIPINE BESYLATE 5 MG PO TABS: 5.0000 mg | ORAL_TABLET | Freq: Every day | ORAL | 3 refills | Status: DC

## 2017-05-05 MED ORDER — METFORMIN HCL 500 MG PO TABS: 500.0000 mg | ORAL_TABLET | Freq: Two times a day (BID) | ORAL | 3 refills | Status: DC

## 2017-05-05 NOTE — Assessment & Plan Note (Signed)
Has continued to have some symptoms.  Possible bronchitis.  We will place him on doxycycline given that his symptoms have worsened over the last couple of days.  If worsening he will be reevaluated.

## 2017-05-05 NOTE — Progress Notes (Signed)
[name], MD Phone: [phone]  Chase Clark is a 52 y.o. male who presents today for follow-up.  Hypertension: Has been running up in the 180s over 90s consistently at home.  He was hospitalized last week for AKI and had lisinopril and Lasix discontinued.  Blood pressure was well controlled in the hospital.  Has been on losartan and HCTZ as well as metoprolol.  No chest pain or shortness of breath.  Diabetes: They took him off metformin in the hospital despite his GFR not going lower than 30.  He notes his blood sugar has spiked up with this into the 300s and 400s.  He notes he feels at his baseline.  He has continued glargine and NovoLog.  He does report some upper respiratory congestion and postnasal drip along with chest congestion and cough.  He was previously treated with azithromycin and did improve though has worsened over the last several days.  Notes mostly chest congestion and postnasal drip.  Cough during the day and at night.  His wife notes he feels worse than prior.  He notes his heel ulcer has been slowly improving.  He follows up with wound care today.  They are discussing whether or not to do a procedure that is similar to a skin graft.  Social History   Tobacco Use  Smoking Status Former Smoker  . Types: Cigarettes  . Last attempt to quit: 03/14/2008  . Years since quitting: 9.1  Smokeless Tobacco Never Used     ROS see history of present illness  Objective  Physical Exam Vitals:   05/05/17 0904  BP: (!) 164/90  Pulse: 70  Temp: 98.1 F (36.7 C)  SpO2: 98%    BP Readings from Last 3 Encounters:  05/05/17 (!) 164/90  04/30/17 111/73  04/29/17 (!) 180/110   Wt Readings from Last 3 Encounters:  05/05/17 218 lb 6.4 oz (99.1 kg)  04/30/17 220 lb 9.6 oz (100.1 kg)  04/29/17 215 lb 6.4 oz (97.7 kg)    Physical Exam  Constitutional: No distress.  HENT:  Head: Normocephalic and atraumatic.  Mouth/Throat: Oropharynx is clear and moist. No  oropharyngeal exudate.  Eyes: Conjunctivae are normal. Pupils are equal, round, and reactive to light.  Cardiovascular: Normal rate, regular rhythm and normal heart sounds.  Pulmonary/Chest: Effort normal and breath sounds normal.  Musculoskeletal: He exhibits no edema.  Neurological: He is alert. Gait normal.  Skin: Skin is warm and dry. He is not diaphoretic.     Assessment/Plan: Please see individual problem list.  Essential hypertension Improved today though still not at goal.  It was controlled in the hospital.  They increased his metoprolol yesterday.  He has not been on amlodipine previously.  We will trial this.  He will continue his current regimen otherwise.  Lab work from yesterday reviewed.  Letter will be produced for his surgeon to try to get him scheduled for next week and we will see him back later this week to recheck blood pressure and lab work.  DM (diabetes mellitus), type 2, uncontrolled w/ophthalmic complication Sugars have been uncontrolled off of metformin.  We will place him back on metformin 500 mg twice daily based on his GFR and plan to recheck kidney function later this week.  Would consider increasing back to his previous dose if GFR continues to improve.  He will continue his insulin regimen.  ARF (acute renal failure) (HCC) Improving after coming off of Lasix and lisinopril.  We will plan to recheck later  this week.  Upper respiratory infection Has continued to have some symptoms.  Possible bronchitis.  We will place him on doxycycline given that his symptoms have worsened over the last couple of days.  If worsening he will be reevaluated.  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  He is being followed by wound care.  Has been progressively improving over the last 10 months.  He will see wound care later today.   Orders Placed This Encounter  Procedures  . CBC with Differential/Platelet     Standing Status:   Future    Standing Expiration Date:   05/05/2018  . Basic Metabolic Panel (BMET)    Standing Status:   Future    Standing Expiration Date:   05/05/2018    Meds ordered this encounter  Medications  . metFORMIN (GLUCOPHAGE) 500 MG tablet    Sig: Take 1 tablet (500 mg total) by mouth 2 (two) times daily with a meal.    Dispense:  180 tablet    Refill:  3  . amLODipine (NORVASC) 5 MG tablet    Sig: Take 1 tablet (5 mg total) by mouth daily.    Dispense:  90 tablet    Refill:  3  . metoprolol tartrate (LOPRESSOR) 25 MG tablet    Sig: Take 3 tablets (75 mg total) by mouth 2 (two) times daily.    Dispense:  180 tablet    Refill:  4  . doxycycline (VIBRA-TABS) 100 MG tablet    Sig: Take 1 tablet (100 mg total) by mouth 2 (two) times daily.    Dispense:  14 tablet    Refill:  0     [name], MD [place]

## 2017-05-05 NOTE — Patient Instructions (Addendum)
Nice to see you. We are going to add amlodipine to your blood pressure regimen. We will treat you with doxycycline for your cough and congestion. We will put you back on metformin 500 mg twice daily.

## 2017-05-05 NOTE — Assessment & Plan Note (Signed)
Sugars have been uncontrolled off of metformin.  We will place him back on metformin 500 mg twice daily based on his GFR and plan to recheck kidney function later this week.  Would consider increasing back to his previous dose if GFR continues to improve.  He will continue his insulin regimen.

## 2017-05-05 NOTE — Assessment & Plan Note (Signed)
This is a chronic issue.  He is being followed by wound care.  Has been progressively improving over the last 10 months.  He will see wound care later today.

## 2017-05-05 NOTE — Assessment & Plan Note (Addendum)
Improved today though still not at goal.  It was controlled in the hospital.  They increased his metoprolol yesterday.  He has not been on amlodipine previously.  We will trial this.  He will continue his current regimen otherwise.  Lab work from yesterday reviewed.  Letter will be produced for his surgeon to try to get him scheduled for next week and we will see him back later this week to recheck blood pressure and lab work.

## 2017-05-05 NOTE — Assessment & Plan Note (Signed)
Improving after coming off of Lasix and lisinopril.  We will plan to recheck later this week.

## 2017-05-06 ENCOUNTER — Other Ambulatory Visit: Payer: Self-pay

## 2017-05-06 ENCOUNTER — Telehealth: Payer: Self-pay | Admitting: Family Medicine

## 2017-05-06 ENCOUNTER — Encounter (HOSPITAL_COMMUNITY): Payer: Self-pay | Admitting: *Deleted

## 2017-05-06 NOTE — Telephone Encounter (Signed)
Noted.  First letter completed and deleted given that it was not complete.  Letter completed.  Kidney function was improving.  Hypertension was improving as well and he was placed on additional medication.  The plan was to recheck later this week though they can check it tomorrow and determine if they are okay to proceed with surgery regarding his blood pressure.  He did complain of upper respiratory symptoms and was placed on doxycycline.  He should mention this to the anesthesiologist tomorrow.  This information was included in the letter that was created.  Please fax this letter to the neurosurgery office.

## 2017-05-06 NOTE — Telephone Encounter (Signed)
FYI

## 2017-05-06 NOTE — Progress Notes (Signed)
SDW-Pre-op call completed by pt spouse, [name] (DPR). Spouse denies pt C/O SOB and chest pain but stated that pt is under the care of Dr. Ubaldo Glassing, Cardiology. Spouse stated that pt has post nasal drip but denies fever. Spouse stated that pt was previously instructed to stop taking Aspirin, vitamins, fish oil, Probiotics and herbal medications. Do not take any NSAIDs ie: Ibuprofen, Advil, Naproxen (Aleve), Motrin, BC and Goody Powder. Spouse made aware to have pt not take Metformin DOS. Spouse made aware to have pt check BG every 2 hours prior to arrival to hospital on DOS. If BG is > 70 pt can take 25 units of Lantus insulin the morning of procedure.Marland Kitchen Spouse made aware that pt should not take Lantus for a BG < 70 but instead, treat a BG < 70 with 4 glucose tabs and wait 15 minutes after taking glucose tabs to recheck BG, if BG remains < 70, call Short Stay unit to speak with a nurse. Spouse made aware to treat a BG > 220 with half of Sliding scale dose. Spouse verbalized understanding of all pre-op instructions. Anesthesia reviewed pt history (see note).

## 2017-05-06 NOTE — Telephone Encounter (Signed)
Left message to notify, faxed letter to [facility]

## 2017-05-06 NOTE — Progress Notes (Signed)
Anesthesia Chart Review:  Pt is a same day work up.   Patient is a 52 year old male scheduled for C5-6 ACDF on 04/22/17 by Dr. Newman Pies.   Procedure was initially scheduled for 04/13/17, but when he arrived for surgery, BP trends were: 170/112 -->181/112 --> 179/109 --> 198/122 --> 218/127 (of note, his BP at his 04/10/17 PAT visit was 136/84). Surgery was cancelled and follow-up arranged with cardiologist Dr. Bartholome Bill Jefm Bryant; [place]). BP then was 142/86. He wrote, "Continue with current medications for blood pressure control. Patient's blood pressure was high when presenting for elective surgery. Anxiety appears to be playing a role. Will continue with current regimen although will increase blood pressure control by adding metoprolol tartrate 25 mg twice daily to his regimen. Patient remains on low risk for surgery from a cardiac standpoint. Anxiolytics should also be considered prior to surgery."   - Surgery was then scheduled for 04/22/17 but postponed due to acute illness.   History includes former smoker, CAD (s/p DES DIAG1 11/20/09), DM2 (DKA admission 05/2016), neuropathy, PAD (right axillary artery stenosis; diabetic left heel ulcer with unsuccessful left PT artery atherectomy 05/20/16), HTN, HLD, LE edema, GERD, glaucoma, childhood asthma, fatty liver, arthritis, cholecystectomy.  - PCP is Dr. [name]. He had recommended cardiac clearance prior to surgery (which was obtained). - Cardiologist is Dr. Bartholome Bill. - Vascular surgeon is Dr. Hortencia Pilar. He also saw Dr. Deitra Mayo in 2017 for right axillary stenosis on CTA. He felt stenosis was more likely related to atherosclerotic plaque rather than thrombus. He was not convinced that right arm symptoms were due to his stenosis.   Meds include: ASA 81 mg,  Plavix (reported mediation discontinued all together; was not listed as a current medication in 03/2017 cardiology notes), Lopressor 37.5 mg BID,  Nexium, Lasix 40 mg daily, Neurontin, Novolog with meals, Lantus, Xalatan ophthalmic, Combigan ophthalmic, lisinopril 40 mg daily, losartan-HCTZ 100-25 mg daily, metformin, Crestor. Patient's wife confirmed he is on both lisinopril and losartan-HCTZ. Wife also reported that he had taken both of these prior to coming in for 04/13/17. They are concerned that if he now holds his ACE inhibitor and ARB on 04/22/17 that his BP will be high again and surgery may be cancelled. Discussed medication list with anesthesiologist Dr. Tamela Gammon 04/21/17, okay for patient to take Lopressor, lisinopril and losartan-HCTZ on the day of surgery. Based on cardiologist's recommendations, could consider Versed if patient still arrives hypertensive to see if that would help.  EKG 04/03/17 [facility]): NSR, abnormal QRS-T angle, consider primary T wave abnormality. Baseline wanderer.  Nuclear stress test 04/09/17 ([place]): Negative Lexiscan stress.No evidence of reversible ischemia.LVEF 52%. Low risk study.  Cardiac cath 12/02/13 [facility]):  Impressions: There is insignificant coronary artery disease. Left ventricular function is normal.  Coronary Circulation: Normal left main. 40% proximal LAD. Discrete 10% in-stent stenosis DIAG1. 25% OM1. Normal RCA.  Ventricles: No LV global or regional wall motion abnormalities.  CTA chest/abd/pelvis 09/05/15: IMPRESSION: 1. 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 This measures approximately 1.6 cm in length on coronal images. This is distal to the origin of the right subscapular artery, and likely explains the patient's clinical findings. 2. No evidence of aortic dissection. 3. Prominent filling of collaterals along the left side of the neck and upper back. This appears to reflect significant focal narrowing of the left subclavian vein as it  passes adjacent to the left  central clavicle and left first rib. Would correlate for any evidence of left arm symptoms. 4. Scattered coronary artery calcifications seen. 5. Minimal bilateral atelectasis noted. Lungs otherwise clear. 6. Mild scattered calcification along the abdominal aorta and its branches. 7. Borderline enlarged prostate. 8. Mild calcific atherosclerotic disease incidentally noted at the right wrist.  RUE arterial U/S 09/26/15: Impression: Right axillary artery stenosis. Arteries widely patent proximally and distally.  MRI c-spine 03/07/17: IMPRESSION: 1. Severe spinal canal stenosis at C5-C6, worsened from the prior study. No cord signal change. 2. Mild disc degeneration at C4-5 and C6-7 without stenosis.   Previously patient's surgery cancelled due to high BP. Medications adjusted by his cardiologist and re-cleared for surgery. Home BP 120/65 on 04/21/17. Pre-op BP medication instructions addressed above. DM not well controlled by last A1c, but reported fasting in the 120 range. If day of surgery labs and BP acceptable and otherwise no acute changes then I would anticipate that he could proceed as planned.   Surgery also postponed due to acute illness.  Pt last evaluated for this 04/29/17 by PCP who felt pt had recovered sufficiently for surgery. However, pt was in acute renal failure: Cr went from 1.32 on 04/03/17 to 2.01 on 04/29/17. Cr down to 1.69 on 05/04/17.  Plan was for PCP to recheck renal function 05/08/17, however, surgery now scheduled for 05/07/17.  Dr. [name] comments 05/06/17: "Kidney function was improving.  Hypertension was improving as well and he was placed on additional medication.  The plan was to recheck later this week though they can check it tomorrow and determine if they are okay to proceed with surgery regarding his blood pressure.  He did complain of upper respiratory symptoms and was placed on doxycycline.  He should mention this to the anesthesiologist tomorrow."    Patient  will need updated labs prior to surgery (CBC and BMET). A1c 9.9 on 04/10/17 (this was called to neurosurgery on 04/10/17).   Pt will need further assessment day of surgery by assigned anesthesiologist. If no concerns for acute illness, BP is acceptable, and labs acceptable, I anticipate pt can proceed with surgery as scheduled.   Willeen Cass, FNP-BC [facility]/Anesthesiology Phone: [phone] 05/06/2017 4:04 PM

## 2017-05-06 NOTE — Telephone Encounter (Signed)
fyi

## 2017-05-06 NOTE — Telephone Encounter (Signed)
Copied from [place]. Topic: Quick Communication - See Telephone Encounter >> May 06, 2017 12:22 PM Boyd Kerbs wrote: CRM for notification. See Telephone encounter for:   Chase Clark, wife called wanting to let Doctor [name] know they called today and moved up surgery for tomorrow at 2:15.    05/06/17.

## 2017-05-07 ENCOUNTER — Ambulatory Visit (HOSPITAL_COMMUNITY): Payer: 59

## 2017-05-07 ENCOUNTER — Encounter (HOSPITAL_COMMUNITY): Payer: Self-pay | Admitting: Urology

## 2017-05-07 ENCOUNTER — Ambulatory Visit (HOSPITAL_COMMUNITY)
Admission: RE | Admit: 2017-05-07 | Discharge: 2017-05-08 | Disposition: A | Discharge status: Home / Self Care | Payer: 59 | Source: Ambulatory Visit | Attending: Neurosurgery | Admitting: Neurosurgery

## 2017-05-07 ENCOUNTER — Encounter (HOSPITAL_COMMUNITY)
Admission: RE | Disposition: A | Discharge status: Home / Self Care | Payer: Self-pay | Source: Ambulatory Visit | Attending: Neurosurgery

## 2017-05-07 ENCOUNTER — Ambulatory Visit (HOSPITAL_COMMUNITY): Payer: 59 | Admitting: Emergency Medicine

## 2017-05-07 DIAGNOSIS — K219 Gastro-esophageal reflux disease without esophagitis: Secondary | ICD-10-CM | POA: Insufficient documentation

## 2017-05-07 DIAGNOSIS — Z87891 Personal history of nicotine dependence: Secondary | ICD-10-CM | POA: Insufficient documentation

## 2017-05-07 DIAGNOSIS — M4322 Fusion of spine, cervical region: Secondary | ICD-10-CM | POA: Diagnosis not present

## 2017-05-07 DIAGNOSIS — E119 Type 2 diabetes mellitus without complications: Secondary | ICD-10-CM | POA: Insufficient documentation

## 2017-05-07 DIAGNOSIS — M1990 Unspecified osteoarthritis, unspecified site: Secondary | ICD-10-CM | POA: Insufficient documentation

## 2017-05-07 DIAGNOSIS — E785 Hyperlipidemia, unspecified: Secondary | ICD-10-CM | POA: Diagnosis not present

## 2017-05-07 DIAGNOSIS — M4712 Other spondylosis with myelopathy, cervical region: Secondary | ICD-10-CM | POA: Diagnosis not present

## 2017-05-07 DIAGNOSIS — Z955 Presence of coronary angioplasty implant and graft: Secondary | ICD-10-CM | POA: Insufficient documentation

## 2017-05-07 DIAGNOSIS — G629 Polyneuropathy, unspecified: Secondary | ICD-10-CM | POA: Diagnosis not present

## 2017-05-07 DIAGNOSIS — M50122 Cervical disc disorder at C5-C6 level with radiculopathy: Secondary | ICD-10-CM | POA: Insufficient documentation

## 2017-05-07 DIAGNOSIS — Z794 Long term (current) use of insulin: Secondary | ICD-10-CM | POA: Insufficient documentation

## 2017-05-07 DIAGNOSIS — M4722 Other spondylosis with radiculopathy, cervical region: Secondary | ICD-10-CM | POA: Insufficient documentation

## 2017-05-07 DIAGNOSIS — I2510 Atherosclerotic heart disease of native coronary artery without angina pectoris: Secondary | ICD-10-CM | POA: Diagnosis not present

## 2017-05-07 DIAGNOSIS — I10 Essential (primary) hypertension: Secondary | ICD-10-CM | POA: Insufficient documentation

## 2017-05-07 DIAGNOSIS — M50022 Cervical disc disorder at C5-C6 level with myelopathy: Secondary | ICD-10-CM | POA: Diagnosis not present

## 2017-05-07 DIAGNOSIS — Z419 Encounter for procedure for purposes other than remedying health state, unspecified: Secondary | ICD-10-CM

## 2017-05-07 DIAGNOSIS — K760 Fatty (change of) liver, not elsewhere classified: Secondary | ICD-10-CM | POA: Insufficient documentation

## 2017-05-07 DIAGNOSIS — Z7982 Long term (current) use of aspirin: Secondary | ICD-10-CM | POA: Diagnosis not present

## 2017-05-07 DIAGNOSIS — Z79899 Other long term (current) drug therapy: Secondary | ICD-10-CM | POA: Diagnosis not present

## 2017-05-07 HISTORY — PX: ANTERIOR CERVICAL DECOMP/DISCECTOMY FUSION: SHX1161

## 2017-05-07 LAB — CBC
HCT: 31.9 % — ABNORMAL LOW (ref 39.0–52.0)
Hemoglobin: 10.3 g/dL — ABNORMAL LOW (ref 13.0–17.0)
MCH: 27.9 pg (ref 26.0–34.0)
MCHC: 32.3 g/dL (ref 30.0–36.0)
MCV: 86.4 fL (ref 78.0–100.0)
Platelets: 212 10*3/uL (ref 150–400)
RBC: 3.69 MIL/uL — ABNORMAL LOW (ref 4.22–5.81)
RDW: 13.8 % (ref 11.5–15.5)
WBC: 10.1 10*3/uL (ref 4.0–10.5)

## 2017-05-07 LAB — BASIC METABOLIC PANEL
Anion gap: 11 (ref 5–15)
BUN: 33 mg/dL — ABNORMAL HIGH (ref 6–20)
CO2: 20 mmol/L — ABNORMAL LOW (ref 22–32)
Calcium: 8.9 mg/dL (ref 8.9–10.3)
Chloride: 105 mmol/L (ref 101–111)
Creatinine, Ser: 1.62 mg/dL — ABNORMAL HIGH (ref 0.61–1.24)
GFR calc Af Amer: 54 mL/min — ABNORMAL LOW (ref >60)
GFR calc non Af Amer: 47 mL/min — ABNORMAL LOW (ref >60)
Glucose, Bld: 162 mg/dL — ABNORMAL HIGH (ref 65–99)
Potassium: 4.5 mmol/L (ref 3.5–5.1)
Sodium: 136 mmol/L (ref 135–145)

## 2017-05-07 LAB — GLUCOSE, CAPILLARY
Glucose-Capillary: 158 mg/dL — ABNORMAL HIGH (ref 65–99)
Glucose-Capillary: 116 mg/dL — ABNORMAL HIGH (ref 65–99)
Glucose-Capillary: 137 mg/dL — ABNORMAL HIGH (ref 65–99)

## 2017-05-07 LAB — TYPE AND SCREEN
ABO/RH(D): B POS
Antibody Screen: NEGATIVE

## 2017-05-07 SURGERY — ANTERIOR CERVICAL DECOMPRESSION/DISCECTOMY FUSION 1 LEVEL
Anesthesia: General | Site: Neck

## 2017-05-07 MED ORDER — LIDOCAINE HCL (CARDIAC) 20 MG/ML IV SOLN
INTRAVENOUS | Status: DC | PRN
  Administered 2017-05-07: 100 mg via INTRAVENOUS

## 2017-05-07 MED ORDER — THROMBIN (RECOMBINANT) 5000 UNITS EX SOLR
OROMUCOSAL | Status: DC | PRN
  Administered 2017-05-07: 5 mL via TOPICAL

## 2017-05-07 MED ORDER — ONDANSETRON HCL 4 MG/2ML IJ SOLN
4.0000 mg | Freq: Four times a day (QID) | INTRAMUSCULAR | Status: DC | PRN
  Administered 2017-05-08: 4 mg via INTRAVENOUS
  Filled 2017-05-07: qty 2

## 2017-05-07 MED ORDER — INSULIN GLARGINE 100 UNIT/ML ~~LOC~~ SOLN
45.0000 [IU] | Freq: Every day | SUBCUTANEOUS | Status: DC
  Administered 2017-05-08: 45 [IU] via SUBCUTANEOUS
  Filled 2017-05-07: qty 0.45

## 2017-05-07 MED ORDER — ACETAMINOPHEN 650 MG RE SUPP: 650.0000 mg | RECTAL | Status: DC | PRN

## 2017-05-07 MED ORDER — INSULIN ASPART 100 UNIT/ML FLEXPEN: 15.0000 [IU] | PEN_INJECTOR | Freq: Three times a day (TID) | SUBCUTANEOUS | Status: DC

## 2017-05-07 MED ORDER — ONDANSETRON HCL 4 MG/2ML IJ SOLN
INTRAMUSCULAR | Status: AC
  Filled 2017-05-07: qty 2

## 2017-05-07 MED ORDER — PROPOFOL 10 MG/ML IV BOLUS
INTRAVENOUS | Status: AC
  Filled 2017-05-07: qty 20

## 2017-05-07 MED ORDER — LACTATED RINGERS IV SOLN: INTRAVENOUS | Status: DC

## 2017-05-07 MED ORDER — FENTANYL CITRATE (PF) 100 MCG/2ML IJ SOLN
INTRAMUSCULAR | Status: DC | PRN
  Administered 2017-05-07 (×2): 50 ug via INTRAVENOUS
  Administered 2017-05-07: 150 ug via INTRAVENOUS

## 2017-05-07 MED ORDER — METOPROLOL TARTRATE 25 MG PO TABS
75.0000 mg | ORAL_TABLET | Freq: Two times a day (BID) | ORAL | Status: DC
  Administered 2017-05-07 – 2017-05-08 (×2): 75 mg via ORAL
  Filled 2017-05-07 (×2): qty 3

## 2017-05-07 MED ORDER — SUGAMMADEX SODIUM 200 MG/2ML IV SOLN
INTRAVENOUS | Status: AC
  Filled 2017-05-07: qty 2

## 2017-05-07 MED ORDER — ONDANSETRON HCL 4 MG PO TABS: 4.0000 mg | ORAL_TABLET | Freq: Four times a day (QID) | ORAL | Status: DC | PRN

## 2017-05-07 MED ORDER — ROCURONIUM BROMIDE 10 MG/ML (PF) SYRINGE
PREFILLED_SYRINGE | INTRAVENOUS | Status: AC
  Filled 2017-05-07: qty 5

## 2017-05-07 MED ORDER — CEFAZOLIN SODIUM 1 G IJ SOLR
INTRAMUSCULAR | Status: AC
  Filled 2017-05-07: qty 20

## 2017-05-07 MED ORDER — HYDROMORPHONE HCL 1 MG/ML IJ SOLN: 0.2500 mg | INTRAMUSCULAR | Status: DC | PRN

## 2017-05-07 MED ORDER — OXYCODONE HCL 5 MG PO TABS
ORAL_TABLET | ORAL | Status: AC
  Administered 2017-05-07: 20:00:00
  Filled 2017-05-07: qty 1

## 2017-05-07 MED ORDER — HYDROMORPHONE HCL 1 MG/ML IJ SOLN
INTRAMUSCULAR | Status: AC
  Administered 2017-05-07: 20:00:00
  Filled 2017-05-07: qty 1

## 2017-05-07 MED ORDER — HYDROCHLOROTHIAZIDE 25 MG PO TABS
25.0000 mg | ORAL_TABLET | Freq: Every day | ORAL | Status: DC
  Administered 2017-05-08: 25 mg via ORAL
  Filled 2017-05-07: qty 1

## 2017-05-07 MED ORDER — DEXAMETHASONE SODIUM PHOSPHATE 4 MG/ML IJ SOLN: 2.0000 mg | Freq: Four times a day (QID) | INTRAMUSCULAR | Status: DC

## 2017-05-07 MED ORDER — PANTOPRAZOLE SODIUM 40 MG PO TBEC: 80.0000 mg | DELAYED_RELEASE_TABLET | Freq: Every day | ORAL | Status: DC

## 2017-05-07 MED ORDER — BRIMONIDINE TARTRATE 0.2 % OP SOLN
1.0000 [drp] | Freq: Two times a day (BID) | OPHTHALMIC | Status: DC
Start: 2017-05-07 — End: 2017-05-08
  Administered 2017-05-07: 1 [drp] via OPHTHALMIC
  Filled 2017-05-07 (×2): qty 5

## 2017-05-07 MED ORDER — CEFAZOLIN SODIUM-DEXTROSE 2-4 GM/100ML-% IV SOLN
2.0000 g | INTRAVENOUS | Status: AC
  Administered 2017-05-07: 2 g via INTRAVENOUS

## 2017-05-07 MED ORDER — ACETAMINOPHEN 325 MG PO TABS: 650.0000 mg | ORAL_TABLET | ORAL | Status: DC | PRN

## 2017-05-07 MED ORDER — CEFAZOLIN SODIUM-DEXTROSE 2-4 GM/100ML-% IV SOLN
2.0000 g | Freq: Three times a day (TID) | INTRAVENOUS | Status: AC
  Administered 2017-05-07 – 2017-05-08 (×2): 2 g via INTRAVENOUS
  Filled 2017-05-07 (×2): qty 100

## 2017-05-07 MED ORDER — LATANOPROST 0.005 % OP SOLN
1.0000 [drp] | Freq: Every day | OPHTHALMIC | Status: DC
  Administered 2017-05-07: 1 [drp] via OPHTHALMIC
  Filled 2017-05-07: qty 2.5

## 2017-05-07 MED ORDER — CHLORHEXIDINE GLUCONATE CLOTH 2 % EX PADS: 6.0000 | MEDICATED_PAD | Freq: Once | CUTANEOUS | Status: DC

## 2017-05-07 MED ORDER — PHENYLEPHRINE HCL 10 MG/ML IJ SOLN
INTRAMUSCULAR | Status: DC | PRN
  Administered 2017-05-07: 30 ug/min via INTRAVENOUS

## 2017-05-07 MED ORDER — ROSUVASTATIN CALCIUM 20 MG PO TABS
20.0000 mg | ORAL_TABLET | Freq: Every day | ORAL | Status: DC
  Administered 2017-05-08: 20 mg via ORAL
  Filled 2017-05-07: qty 1

## 2017-05-07 MED ORDER — DEXAMETHASONE 4 MG PO TABS
4.0000 mg | ORAL_TABLET | Freq: Four times a day (QID) | ORAL | Status: DC
  Administered 2017-05-07 – 2017-05-08 (×2): 4 mg via ORAL
  Filled 2017-05-07 (×2): qty 1

## 2017-05-07 MED ORDER — DEXAMETHASONE SODIUM PHOSPHATE 10 MG/ML IJ SOLN
INTRAMUSCULAR | Status: AC
  Filled 2017-05-07: qty 1

## 2017-05-07 MED ORDER — LIDOCAINE 2% (20 MG/ML) 5 ML SYRINGE
INTRAMUSCULAR | Status: AC
  Filled 2017-05-07: qty 5

## 2017-05-07 MED ORDER — OXYCODONE HCL 5 MG PO TABS
10.0000 mg | ORAL_TABLET | ORAL | Status: DC | PRN
  Administered 2017-05-07 – 2017-05-08 (×3): 10 mg via ORAL
  Filled 2017-05-07 (×3): qty 2

## 2017-05-07 MED ORDER — PHENOL 1.4 % MT LIQD: 1.0000 | OROMUCOSAL | Status: DC | PRN

## 2017-05-07 MED ORDER — TIMOLOL MALEATE 0.5 % OP SOLN
1.0000 [drp] | Freq: Two times a day (BID) | OPHTHALMIC | Status: DC
  Administered 2017-05-07: 1 [drp] via OPHTHALMIC
  Filled 2017-05-07 (×2): qty 5

## 2017-05-07 MED ORDER — 0.9 % SODIUM CHLORIDE (POUR BTL) OPTIME
TOPICAL | Status: DC | PRN
  Administered 2017-05-07: 1000 mL

## 2017-05-07 MED ORDER — ACETAMINOPHEN 500 MG PO TABS
1000.0000 mg | ORAL_TABLET | Freq: Four times a day (QID) | ORAL | Status: DC
  Administered 2017-05-07 – 2017-05-08 (×2): 1000 mg via ORAL
  Filled 2017-05-07 (×2): qty 2

## 2017-05-07 MED ORDER — PHENYLEPHRINE HCL 10 MG/ML IJ SOLN
INTRAMUSCULAR | Status: AC
  Filled 2017-05-07: qty 1

## 2017-05-07 MED ORDER — THROMBIN (RECOMBINANT) 5000 UNITS EX SOLR
CUTANEOUS | Status: AC
  Filled 2017-05-07: qty 5000

## 2017-05-07 MED ORDER — HYDROMORPHONE HCL 1 MG/ML IJ SOLN
0.2500 mg | INTRAMUSCULAR | Status: DC | PRN
  Administered 2017-05-07 (×2): 0.5 mg via INTRAVENOUS

## 2017-05-07 MED ORDER — MORPHINE SULFATE (PF) 4 MG/ML IV SOLN: 4.0000 mg | INTRAVENOUS | Status: DC | PRN

## 2017-05-07 MED ORDER — VANCOMYCIN HCL 1000 MG IV SOLR
INTRAVENOUS | Status: AC
  Filled 2017-05-07: qty 1000

## 2017-05-07 MED ORDER — MIDAZOLAM HCL 2 MG/2ML IJ SOLN
INTRAMUSCULAR | Status: AC
  Filled 2017-05-07: qty 2

## 2017-05-07 MED ORDER — INSULIN ASPART 100 UNIT/ML ~~LOC~~ SOLN: 15.0000 [IU] | Freq: Every day | SUBCUTANEOUS | Status: DC

## 2017-05-07 MED ORDER — AMLODIPINE BESYLATE 5 MG PO TABS
5.0000 mg | ORAL_TABLET | Freq: Every day | ORAL | Status: DC
  Administered 2017-05-08: 5 mg via ORAL
  Filled 2017-05-07: qty 1

## 2017-05-07 MED ORDER — MENTHOL 3 MG MT LOZG: 1.0000 | LOZENGE | OROMUCOSAL | Status: DC | PRN

## 2017-05-07 MED ORDER — ALUM & MAG HYDROXIDE-SIMETH 200-200-20 MG/5ML PO SUSP: 30.0000 mL | Freq: Four times a day (QID) | ORAL | Status: DC | PRN

## 2017-05-07 MED ORDER — PROPOFOL 10 MG/ML IV BOLUS
INTRAVENOUS | Status: DC | PRN
  Administered 2017-05-07: 160 mg via INTRAVENOUS

## 2017-05-07 MED ORDER — BISACODYL 10 MG RE SUPP
10.0000 mg | Freq: Every day | RECTAL | Status: DC | PRN
Start: 2017-05-07 — End: 2017-05-08

## 2017-05-07 MED ORDER — LACTATED RINGERS IV SOLN
INTRAVENOUS | Status: DC
  Administered 2017-05-07 (×2): via INTRAVENOUS

## 2017-05-07 MED ORDER — MIDAZOLAM HCL 5 MG/5ML IJ SOLN
INTRAMUSCULAR | Status: DC | PRN
  Administered 2017-05-07: 2 mg via INTRAVENOUS

## 2017-05-07 MED ORDER — SODIUM CHLORIDE 0.9 % IR SOLN
Status: DC | PRN
  Administered 2017-05-07: 500 mL

## 2017-05-07 MED ORDER — ONDANSETRON HCL 4 MG/2ML IJ SOLN
INTRAMUSCULAR | Status: DC | PRN
  Administered 2017-05-07: 4 mg via INTRAVENOUS

## 2017-05-07 MED ORDER — FENTANYL CITRATE (PF) 250 MCG/5ML IJ SOLN
INTRAMUSCULAR | Status: AC
  Filled 2017-05-07: qty 5

## 2017-05-07 MED ORDER — EPHEDRINE SULFATE 50 MG/ML IJ SOLN
INTRAMUSCULAR | Status: DC | PRN
  Administered 2017-05-07 (×2): 20 mg via INTRAVENOUS

## 2017-05-07 MED ORDER — CEFAZOLIN SODIUM-DEXTROSE 2-4 GM/100ML-% IV SOLN
INTRAVENOUS | Status: AC
  Filled 2017-05-07: qty 100

## 2017-05-07 MED ORDER — OXYCODONE HCL 5 MG PO TABS
5.0000 mg | ORAL_TABLET | ORAL | Status: DC | PRN
  Administered 2017-05-07: 5 mg via ORAL

## 2017-05-07 MED ORDER — BUPIVACAINE-EPINEPHRINE (PF) 0.5% -1:200000 IJ SOLN
INTRAMUSCULAR | Status: AC
  Filled 2017-05-07: qty 30

## 2017-05-07 MED ORDER — BRIMONIDINE TARTRATE-TIMOLOL 0.2-0.5 % OP SOLN
1.0000 [drp] | Freq: Two times a day (BID) | OPHTHALMIC | Status: DC
  Filled 2017-05-07: qty 5

## 2017-05-07 MED ORDER — FUROSEMIDE 40 MG PO TABS
40.0000 mg | ORAL_TABLET | Freq: Every day | ORAL | Status: DC
  Administered 2017-05-08: 40 mg via ORAL
  Filled 2017-05-07: qty 1

## 2017-05-07 MED ORDER — LOSARTAN POTASSIUM 50 MG PO TABS
100.0000 mg | ORAL_TABLET | Freq: Every day | ORAL | Status: DC
  Administered 2017-05-08: 100 mg via ORAL
  Filled 2017-05-07: qty 2

## 2017-05-07 MED ORDER — ROCURONIUM BROMIDE 100 MG/10ML IV SOLN
INTRAVENOUS | Status: DC | PRN
  Administered 2017-05-07: 50 mg via INTRAVENOUS

## 2017-05-07 MED ORDER — BACITRACIN ZINC 500 UNIT/GM EX OINT
TOPICAL_OINTMENT | CUTANEOUS | Status: AC
  Filled 2017-05-07: qty 28.35

## 2017-05-07 MED ORDER — INSULIN ASPART 100 UNIT/ML ~~LOC~~ SOLN
10.0000 [IU] | Freq: Every day | SUBCUTANEOUS | Status: DC
  Administered 2017-05-08: 10 [IU] via SUBCUTANEOUS

## 2017-05-07 MED ORDER — GABAPENTIN 300 MG PO CAPS
600.0000 mg | ORAL_CAPSULE | Freq: Two times a day (BID) | ORAL | Status: DC
  Administered 2017-05-07 – 2017-05-08 (×2): 600 mg via ORAL
  Filled 2017-05-07 (×2): qty 2

## 2017-05-07 MED ORDER — SUGAMMADEX SODIUM 200 MG/2ML IV SOLN
INTRAVENOUS | Status: DC | PRN
  Administered 2017-05-07: 197.8 mg via INTRAVENOUS

## 2017-05-07 MED ORDER — DEXAMETHASONE SODIUM PHOSPHATE 10 MG/ML IJ SOLN
INTRAMUSCULAR | Status: DC | PRN
  Administered 2017-05-07: 5 mg via INTRAVENOUS

## 2017-05-07 MED ORDER — METFORMIN HCL 500 MG PO TABS
500.0000 mg | ORAL_TABLET | Freq: Two times a day (BID) | ORAL | Status: DC
  Administered 2017-05-08: 500 mg via ORAL
  Filled 2017-05-07: qty 1

## 2017-05-07 MED ORDER — DOCUSATE SODIUM 100 MG PO CAPS
100.0000 mg | ORAL_CAPSULE | Freq: Two times a day (BID) | ORAL | Status: DC
  Administered 2017-05-07 – 2017-05-08 (×2): 100 mg via ORAL
  Filled 2017-05-07 (×2): qty 1

## 2017-05-07 MED ORDER — LOSARTAN POTASSIUM-HCTZ 100-25 MG PO TABS: 1.0000 | ORAL_TABLET | Freq: Every day | ORAL | Status: DC

## 2017-05-07 SURGICAL SUPPLY — 60 items
APL SKNCLS STERI-STRIP NONHPOA (GAUZE/BANDAGES/DRESSINGS) ×1
BAG DECANTER FOR FLEXI CONT (MISCELLANEOUS) ×2 IMPLANT
BENZOIN TINCTURE PRP APPL 2/3 (GAUZE/BANDAGES/DRESSINGS) ×2 IMPLANT
BIT DRILL NEURO 2X3.1 SFT TUCH (MISCELLANEOUS) ×1 IMPLANT
BLADE SURG 15 STRL LF DISP TIS (BLADE) ×1 IMPLANT
BLADE SURG 15 STRL SS (BLADE) ×2
BLADE ULTRA TIP 2M (BLADE) ×2 IMPLANT
BUR BARREL STRAIGHT FLUTE 4.0 (BURR) ×2 IMPLANT
BUR MATCHSTICK NEURO 3.0 LAGG (BURR) ×2 IMPLANT
CANISTER SUCT 3000ML PPV (MISCELLANEOUS) ×2 IMPLANT
CARTRIDGE OIL MAESTRO DRILL (MISCELLANEOUS) ×1 IMPLANT
COVER MAYO STAND STRL (DRAPES) ×2 IMPLANT
DECANTER SPIKE VIAL GLASS SM (MISCELLANEOUS) ×2 IMPLANT
DEVICE FUSION VIST S 14X14X6MM (Trauma) IMPLANT
DIFFUSER DRILL AIR PNEUMATIC (MISCELLANEOUS) ×2 IMPLANT
DRAPE LAPAROTOMY 100X72 PEDS (DRAPES) ×2 IMPLANT
DRAPE MICROSCOPE LEICA (MISCELLANEOUS) ×1 IMPLANT
DRAPE POUCH INSTRU U-SHP 10X18 (DRAPES) ×2 IMPLANT
DRAPE SURG 17X23 STRL (DRAPES) ×4 IMPLANT
DRILL NEURO 2X3.1 SOFT TOUCH (MISCELLANEOUS) ×2
ELECT REM PT RETURN 9FT ADLT (ELECTROSURGICAL) ×2
ELECTRODE REM PT RTRN 9FT ADLT (ELECTROSURGICAL) ×1 IMPLANT
GAUZE SPONGE 4X4 12PLY STRL (GAUZE/BANDAGES/DRESSINGS) ×2 IMPLANT
GAUZE SPONGE 4X4 12PLY STRL LF (GAUZE/BANDAGES/DRESSINGS) ×1 IMPLANT
GAUZE SPONGE 4X4 16PLY XRAY LF (GAUZE/BANDAGES/DRESSINGS) IMPLANT
GLOVE BIO SURGEON STRL SZ8 (GLOVE) ×3 IMPLANT
GLOVE BIO SURGEON STRL SZ8.5 (GLOVE) ×2 IMPLANT
GLOVE EXAM NITRILE LRG STRL (GLOVE) IMPLANT
GLOVE EXAM NITRILE XL STR (GLOVE) IMPLANT
GLOVE EXAM NITRILE XS STR PU (GLOVE) IMPLANT
GOWN STRL REUS W/ TWL LRG LVL3 (GOWN DISPOSABLE) IMPLANT
GOWN STRL REUS W/ TWL XL LVL3 (GOWN DISPOSABLE) ×1 IMPLANT
GOWN STRL REUS W/TWL LRG LVL3 (GOWN DISPOSABLE) ×2
GOWN STRL REUS W/TWL XL LVL3 (GOWN DISPOSABLE) ×2
HEMOSTAT POWDER KIT SURGIFOAM (HEMOSTASIS) ×2 IMPLANT
KIT BASIN OR (CUSTOM PROCEDURE TRAY) ×2 IMPLANT
KIT ROOM TURNOVER OR (KITS) ×2 IMPLANT
MARKER SKIN DUAL TIP RULER LAB (MISCELLANEOUS) ×2 IMPLANT
NDL SPNL 18GX3.5 QUINCKE PK (NEEDLE) ×1 IMPLANT
NEEDLE HYPO 22GX1.5 SAFETY (NEEDLE) ×2 IMPLANT
NEEDLE SPNL 18GX3.5 QUINCKE PK (NEEDLE) ×2 IMPLANT
NS IRRIG 1000ML POUR BTL (IV SOLUTION) ×2 IMPLANT
OIL CARTRIDGE MAESTRO DRILL (MISCELLANEOUS) ×2
PACK LAMINECTOMY NEURO (CUSTOM PROCEDURE TRAY) ×2 IMPLANT
PIN DISTRACTION 14MM (PIN) ×4 IMPLANT
PLATE ANT CERV XTEND ELD 1 L12 (Plate) ×1 IMPLANT
PUTTY KINEX BIOACTIVE 2CC (Bone Implant) ×1 IMPLANT
RUBBERBAND STERILE (MISCELLANEOUS) IMPLANT
SCREW XTD VAR 4.2 SELF TAP (Screw) ×4 IMPLANT
SPONGE INTESTINAL PEANUT (DISPOSABLE) ×4 IMPLANT
SPONGE SURGIFOAM ABS GEL SZ50 (HEMOSTASIS) IMPLANT
STRIP CLOSURE SKIN 1/2X4 (GAUZE/BANDAGES/DRESSINGS) ×2 IMPLANT
SUT VIC AB 0 CT1 27 (SUTURE) ×2
SUT VIC AB 0 CT1 27XBRD ANTBC (SUTURE) ×1 IMPLANT
SUT VIC AB 3-0 SH 8-18 (SUTURE) ×2 IMPLANT
TAPE CLOTH SURG 4X10 WHT LF (GAUZE/BANDAGES/DRESSINGS) ×1 IMPLANT
TOWEL GREEN STERILE (TOWEL DISPOSABLE) ×2 IMPLANT
TOWEL GREEN STERILE FF (TOWEL DISPOSABLE) ×2 IMPLANT
VISTA S O 14X14X6MM (Trauma) ×2 IMPLANT
WATER STERILE IRR 1000ML POUR (IV SOLUTION) ×2 IMPLANT

## 2017-05-07 NOTE — Anesthesia Postprocedure Evaluation (Signed)
Anesthesia Post Note  Patient: DELFINO FRIESEN  Procedure(s) Performed: ANTERIOR CERVICAL DECOMPRESSION/DISCECTOMY FUSION, NTERBODY PROSTHESIS,PLATE/SCREWS CERVICAL FIVE - CERVICAL SIX (N/A Neck)     Patient location during evaluation: PACU Anesthesia Type: General Level of consciousness: sedated Pain management: pain level controlled Vital Signs Assessment: post-procedure vital signs reviewed and stable Respiratory status: spontaneous breathing and respiratory function stable Cardiovascular status: stable Postop Assessment: no apparent nausea or vomiting Anesthetic complications: no    Last Vitals:  Vitals:   05/07/17 2019 05/07/17 2030  BP: (!) 144/84 (!) 161/96  Pulse: 67 66  Resp: 16 20  Temp: (!) 36.2 C 36.8 C  SpO2: 100% 98%    Last Pain:  Vitals:   05/07/17 2030  TempSrc: Oral  PainSc:                  SINGER,JAMES DANIEL

## 2017-05-07 NOTE — Anesthesia Procedure Notes (Signed)
Procedure Name: Intubation Date/Time: 05/07/2017 5:18 PM Performed by: Lance Coon, CRNA Pre-anesthesia Checklist: Patient identified, Emergency Drugs available, Suction available, Patient being monitored and Timeout performed Patient Re-evaluated:Patient Re-evaluated prior to induction Oxygen Delivery Method: Circle system utilized Preoxygenation: Pre-oxygenation with 100% oxygen Induction Type: IV induction Ventilation: Mask ventilation without difficulty Laryngoscope Size: Miller and 3 Grade View: Grade I Tube type: Oral Tube size: 7.5 mm Number of attempts: 1 Airway Equipment and Method: Stylet Placement Confirmation: ETT inserted through vocal cords under direct vision,  positive ETCO2 and breath sounds checked- equal and bilateral Secured at: 21 cm Tube secured with: Tape Dental Injury: Teeth and Oropharynx as per pre-operative assessment

## 2017-05-07 NOTE — Transfer of Care (Signed)
Immediate Anesthesia Transfer of Care Note  Patient: Chase Clark  Procedure(s) Performed: ANTERIOR CERVICAL DECOMPRESSION/DISCECTOMY Luevenia Maxin PROSTHESIS,PLATE/SCREWS CERVICAL FIVE - CERVICAL SIX (N/A Neck)  Patient Location: PACU  Anesthesia Type:General  Level of Consciousness: awake, alert  and oriented  Airway & Oxygen Therapy: Patient Spontanous Breathing and Patient connected to nasal cannula oxygen  Post-op Assessment: Report given to RN and Post -op Vital signs reviewed and stable  Post vital signs: Reviewed and stable  Last Vitals:  Vitals:   05/07/17 1338 05/07/17 1930  BP: (!) 174/90   Pulse: 70   Resp:    Temp:  (!) (P) 36.1 C  SpO2:      Last Pain:  Vitals:   05/07/17 1930  TempSrc:   PainSc: (P) 0-No pain      Patients Stated Pain Goal: 2 (68/59/92 3414)  Complications: No apparent anesthesia complications

## 2017-05-07 NOTE — Progress Notes (Signed)
Patient ID: Chase Clark, male   DOB: 04-20-1965, 52 y.o.   MRN: [id number] Subjective: The patient is alert and pleasant.  He looks well.  He is in no apparent distress.  Objective: Vital signs in last 24 hours: Temp:  [97.9 F (36.6 C)] 97.9 F (36.6 C) (01/24 1220) Pulse Rate:  [68-70] 70 (01/24 1338) Resp:  [18] 18 (01/24 1220) BP: (170-174)/(81-90) 174/90 (01/24 1338) SpO2:  [97 %] 97 % (01/24 1220) Weight:  [98.9 kg (218 lb)] 98.9 kg (218 lb) (01/24 1220) Estimated body mass index is 31.28 kg/m as calculated from the following:   Height as of 04/30/17: 5\' 10"  (1.778 m).   Weight as of this encounter: 98.9 kg (218 lb).   Intake/Output from previous day: No intake/output data recorded. Intake/Output this shift: Total I/O In: 500 [I.V.:500] Out: -   Physical exam the patient is alert and pleasant.  He is moving all 4 extremities well.  His dressing is clean and dry.  There is no hematoma or shift.  Lab Results: Recent Labs    05/07/17 1212  WBC 10.1  HGB 10.3*  HCT 31.9*  PLT 212   BMET Recent Labs    05/07/17 1212  NA 136  K 4.5  CL 105  CO2 20*  GLUCOSE 162*  BUN 33*  CREATININE 1.62*  CALCIUM 8.9    Studies/Results: No results found.  Assessment/Plan: The patient is doing well.  LOS: 0 days     Ophelia Charter 05/07/2017, 7:34 PM

## 2017-05-07 NOTE — Op Note (Signed)
Brief history: The patient is a 52 year old white male who has complained of neck and right arm pain consistent with a cervical radiculopathy.  He has failed medical management and was worked up with a cervical MRI.  This demonstrated disc degeneration, spondylosis, stenosis at C5-6.  I discussed the various treatment options with the patient including surgery.  He has weighed the risks, benefits, and alternatives of surgery and decided to proceed with a C5-6 anterior cervical discectomy, fusion and plating.  Preoperative diagnosis: C5-6 disc degeneration, spondylosis, herniated disc, cervical radiculopathy, cervicalgia, cervical myelopathy  Postoperative diagnosis: The same  Procedure: C5-6 anterior cervical discectomy/decompression; C5-6 interbody arthrodesis with local morcellized autograft bone and Kinnex bone graft extender; insertion of interbody prosthesis at C5-6 (Zimmer peek interbody prosthesis); anterior cervical plating from C5-6 with globus titanium plate  Surgeon: Dr. Earle Gell  Asst.: Dr. Sherley Bounds  Anesthesia: Gen. endotracheal  Estimated blood loss: Minimal  Drains: None  Complications: None  Description of procedure: The patient was brought to the operating room by the anesthesia team. General endotracheal anesthesia was induced. A roll was placed under the patient's shoulders to keep the neck in the neutral position. The patient's anterior cervical region was then prepared with Betadine scrub and Betadine solution. Sterile drapes were applied.  The area to be incised was then injected with Marcaine with epinephrine solution. I then used a scalpel to make a transverse incision in the patient's left anterior neck. I used the Metzenbaum scissors to divide the platysmal muscle and then to dissect medial to the sternocleidomastoid muscle, jugular vein, and carotid artery. I carefully dissected down towards the anterior cervical spine identifying the esophagus and retracting  it medially. Then using Kitner swabs to clear soft tissue from the anterior cervical spine. We then inserted a bent spinal needle into the upper exposed intervertebral disc space. We then obtained intraoperative radiographs confirm our location.  I then used electrocautery to detach the medial border of the longus colli muscle bilaterally from the C5-6 intervertebral disc spaces. I then inserted the Caspar self-retaining retractor underneath the longus colli muscle bilaterally to provide exposure.  We then incised the intervertebral disc at C5-6. We then performed a partial intervertebral discectomy with a pituitary forceps and the Karlin curettes. I then inserted distraction screws into the vertebral bodies at C5-6. We then distracted the interspace. We then used the high-speed drill to decorticate the vertebral endplates at X9-1, to drill away the remainder of the intervertebral disc, to drill away some posterior spondylosis, and to thin out the posterior longitudinal ligament. I then incised ligament with the arachnoid knife. We then removed the ligament with a Kerrison punches undercutting the vertebral endplates and decompressing the thecal sac. We then performed foraminotomies about the bilateral C6 nerve roots. This completed the decompression at this level.  We now turned our to attention to the interbody fusion. We used the trial spacers to determine the appropriate size for the interbody prosthesis. We then pre-filled prosthesis with a combination of local morcellized autograft bone that we obtained during decompression as well as Kinnex bone graft extender. We then inserted the prosthesis into the distracted interspace at C5-6. We then removed the distraction screws. There was a good snug fit of the prosthesis in the interspace.  Having completed the fusion we now turned attention to the anterior spinal instrumentation. We used the high-speed drill to drill away some anterior spondylosis at the  disc spaces so that the plate lay down flat. We selected the  appropriate length titanium anterior cervical plate. We laid it along the anterior aspect of the vertebral bodies from C5-6. We then drilled 14 mm holes at C5 and C6. We then secured the plate to the vertebral bodies by placing two 14 mm self-tapping screws at C5 and C6. We then obtained intraoperative radiograph. The demonstrating good position of the instrumentation. We therefore secured the screws the plate the locking each cam. This completed the instrumentation.  We then obtained hemostasis using bipolar electrocautery. We irrigated the wound out with bacitracin solution. We then removed the retractor. We inspected the esophagus for any damage. There was none apparent. We then reapproximated patient's platysmal muscle with interrupted 3-0 Vicryl suture. We then reapproximated the subcutaneous tissue with interrupted 3-0 Vicryl suture. The skin was reapproximated with Steri-Strips and benzoin. The wound was then covered with bacitracin ointment. A sterile dressing was applied. The drapes were removed. Patient was subsequently extubated by the anesthesia team and transported to the post anesthesia care unit in stable condition. All sponge instrument and needle counts were reportedly correct at the end of this case.

## 2017-05-07 NOTE — H&P (Signed)
Subjective: The patient is a 52 year old diabetic white male who complains of right neck pain with pain numbness and tingling to his right arm.  He has failed medical management and was worked up with a cervical MRI.  This demonstrated the patient had spondylosis, stenosis, etc. at C5-6.  I discussed the various treatment options with the patient including surgery.  He has decided to proceed with surgery after weighing the risks, benefits, and alternatives of surgery.  Past Medical History:  Diagnosis Date  . Arthritis   . Asthma    as child  . Coronary artery disease    DES DIAG1 11/20/09 [facility])  . Diabetes mellitus without complication ([place])   . Edema, lower extremity   . Fatty liver   . GERD (gastroesophageal reflux disease)   . Glaucoma   . Heart disease 2011   Patient has stent for 80% blockage  . Hyperlipidemia   . Hypertension   . Neuropathy   . Seasonal allergies   . Shoulder pain   . Ulcer of foot ([place]) 08.06.2014   DIABETIC ULCERATIONS ASSOCIATED WITH IRRITATION LATERAL ANKLE LEFT GREATER THAN RIGHT WITH MILD CELLULITIS    Past Surgical History:  Procedure Laterality Date  . ABDOMINAL AORTOGRAM N/A 05/20/2016   Procedure: Abdominal Aortogram possible intervention;  Surgeon: Katha Cabal, MD;  Location: [facility];  Service: Cardiovascular;  Laterality: N/A;  . CARDIAC CATHETERIZATION    . CATARACT EXTRACTION W/ INTRAOCULAR LENS IMPLANT    . CATARACT EXTRACTION W/PHACO Left 02/26/2017   Procedure: CATARACT EXTRACTION PHACO AND INTRAOCULAR LENS PLACEMENT (IOC);  Surgeon: Eulogio Bear, MD;  Location: [facility];  Service: Ophthalmology;  Laterality: Left;  Lot # [id number] H [place]: 00:25.3 AP%: 6.2 CDE: 1.59   . CHOLECYSTECTOMY N/A   . CORONARY ANGIOPLASTY WITH STENT PLACEMENT  2007   1st diagonal  . EPIBLEPHERON REPAIR WITH TEAR DUCT PROBING    . EYE SURGERY Right sept 29n2015   cataract extraction  . INSERTION EXPRESS TUBE SHUNT Right 09/06/2015   Procedure: INSERTION AHMED TUBE SHUNT with tutoplast allograft;  Surgeon: Eulogio Bear, MD;  Location: [facility] OR[facility];  Service: Ophthalmology;  Laterality: Right;  Marland Kitchen VASECTOMY      Allergies  Allergen Reactions  . Atorvastatin Other (See Comments)    Myalgias     Social History   Tobacco Use  . Smoking status: Former Smoker    Types: Cigarettes    Last attempt to quit: 03/14/2008    Years since quitting: 9.1  . Smokeless tobacco: Never Used  Substance Use Topics  . Alcohol use: Yes    Comment: OCCAS    Family History  Problem Relation Age of Onset  . Hyperlipidemia Mother   . Diabetes Mother   . Liver disease Mother        NASH  . Hyperlipidemia Father   . Diabetes Father   . Heart disease Father   . Hypertension Father    Prior to Admission medications   Medication Sig Start Date End Date Taking? Authorizing Provider  acetaminophen (TYLENOL) 500 MG tablet Take 1,000 mg by mouth every 8 (eight) hours as needed for mild pain or headache.   Yes [provider]  amLODipine (NORVASC) 5 MG tablet Take 1 tablet (5 mg total) by mouth daily. 05/05/17  Yes [name], MD  brimonidine-timolol (COMBIGAN) 0.2-0.5 % ophthalmic solution Place 1 drop into the right eye every 12 (twelve) hours.    Yes [provider]  doxycycline (VIBRA-TABS)  100 MG tablet Take 1 tablet (100 mg total) by mouth 2 (two) times daily. 05/05/17  Yes [name], MD  DUREZOL 0.05 % EMUL APPLY 1 DROP(S) IN LEFT EYE 2 TIMES A DAY 03/27/17  Yes [provider]  esomeprazole (NEXIUM) 40 MG capsule Take 1 capsule (40 mg total) by mouth daily. Patient taking differently: Take 40 mg by mouth daily.  05/21/16  Yes Wieting, Richard, MD  gabapentin (NEURONTIN) 300 MG capsule Take 2 capsules (600 mg total) by mouth 2 (two) times daily. 07/09/16  Yes [name], MD  insulin aspart (NOVOLOG FLEXPEN) 100 UNIT/ML FlexPen Inject 10 units with breakfast, 10-15 units with lunch, and  15-20 units with supper Patient taking differently: Inject 15-20 Units into the skin 3 (three) times daily with meals. Per sliding scale 02/23/17  Yes [name], MD  insulin glargine (LANTUS) 100 UNIT/ML injection Inject 0.45 mLs (45 Units total) into the skin daily. Patient taking differently: Inject 50 Units into the skin daily. Takes in the morning 10/10/16  Yes [name], MD  latanoprost (XALATAN) 0.005 % ophthalmic solution Place 1 drop into both eyes at bedtime. 03/03/17  Yes [provider]  losartan-hydrochlorothiazide (HYZAAR) 100-25 MG tablet Take 1 tablet by mouth daily. 07/15/15  Yes [provider]  metFORMIN (GLUCOPHAGE) 500 MG tablet Take 1 tablet (500 mg total) by mouth 2 (two) times daily with a meal. 05/05/17  Yes [name], MD  metoprolol tartrate (LOPRESSOR) 25 MG tablet Take 3 tablets (75 mg total) by mouth 2 (two) times daily. 05/05/17  Yes [name], MD  Multiple Vitamin (MULTIVITAMIN WITH MINERALS) TABS tablet Take 1 tablet by mouth daily.   Yes [provider]  Probiotic Product (PROBIOTIC PO) Take 1 tablet by mouth daily.   Yes [provider]  rosuvastatin (CRESTOR) 20 MG tablet Take 1 tablet (20 mg total) daily by mouth. 02/23/17  Yes [name], MD  aspirin EC 81 MG tablet Take 81 mg by mouth daily.    [provider]  furosemide (LASIX) 40 MG tablet Take 40 mg by mouth as needed. 04/29/17   [provider]  Insulin Pen Needle (PEN NEEDLES) 32G X 4 MM MISC Inject 90 each 3 (three) times daily into the skin. 02/23/17   [name], MD     Review of Systems  Positive ROS: As above  All other systems have been reviewed and were otherwise negative with the exception of those mentioned in the HPI and as above.  Objective: Vital signs in last 24 hours: Temp:  [97.9 F (36.6 C)] 97.9 F (36.6 C) (01/24 1220) Pulse Rate:  [68-70] 70 (01/24 1338) Resp:  [18] 18 (01/24 1220) BP:  (170-174)/(81-90) 174/90 (01/24 1338) SpO2:  [97 %] 97 % (01/24 1220) Weight:  [98.9 kg (218 lb)] 98.9 kg (218 lb) (01/24 1220) Estimated body mass index is 31.28 kg/m as calculated from the following:   Height as of 04/30/17: 5\' 10"  (1.778 m).   Weight as of this encounter: 98.9 kg (218 lb).   General Appearance: Alert Head: Normocephalic, without obvious abnormality, atraumatic Eyes: PERRL, conjunctiva/corneas clear, EOM's intact,    Ears: Normal  Throat: Normal  Neck: Supple, Back: unremarkable Lungs: Clear to auscultation bilaterally, respirations unlabored Heart: Regular rate and rhythm, no murmur, rub or gallop Abdomen: Soft, non-tender Extremities: Extremities normal, atraumatic, no cyanosis or edema Skin: unremarkable  NEUROLOGIC:   Mental status: alert and oriented,Motor Exam -  grossly normal Sensory Exam - grossly normal Reflexes:  Coordination - grossly normal Gait - grossly normal Balance - grossly normal Cranial Nerves: I: smell Not tested  II: visual acuity  OS: Normal  OD: Normal   II: visual fields Full to confrontation  II: pupils Equal, round, reactive to light  III,VII: ptosis None  III,IV,VI: extraocular muscles  Full ROM  V: mastication Normal  V: facial light touch sensation  Normal  V,VII: corneal reflex  Present  VII: facial muscle function - upper  Normal  VII: facial muscle function - lower Normal  VIII: hearing Not tested  IX: soft palate elevation  Normal  IX,X: gag reflex Present  XI: trapezius strength  5/5  XI: sternocleidomastoid strength 5/5  XI: neck flexion strength  5/5  XII: tongue strength  Normal    Data Review Lab Results  Component Value Date   WBC 10.1 05/07/2017   HGB 10.3 (L) 05/07/2017   HCT 31.9 (L) 05/07/2017   MCV 86.4 05/07/2017   PLT 212 05/07/2017   Lab Results  Component Value Date   NA 136 05/07/2017   K 4.5 05/07/2017   CL 105 05/07/2017   CO2 20 (L) 05/07/2017   BUN 33 (H) 05/07/2017   CREATININE  1.62 (H) 05/07/2017   GLUCOSE 162 (H) 05/07/2017   Lab Results  Component Value Date   INR 1.03 04/10/2017    Assessment/Plan: C5-6 herniated disc, spondylosis, stenosis, cervical radiculopathy, cervical myelopathy: I have discussed the situation with the patient.  I have reviewed his imaging studies with him and pointed out the abnormalities.  We have discussed the various treatment options including surgery.  I have described the surgical treatment option of a C5-6 anterior cervical discectomy, fusion and plating.  I have shown him surgical models.  I have given him a surgical pamphlet.  We have discussed the risks, benefits, alternatives, expected postoperative course, and likelihood of achieving our goals with surgery.  I have answered all the patient's questions.  He has decided to proceed with surgery.     Ophelia Charter 05/07/2017 4:30 PM

## 2017-05-07 NOTE — Anesthesia Preprocedure Evaluation (Addendum)
Anesthesia Evaluation  Patient identified by MRN, date of birth, ID band Patient awake    Reviewed: Allergy & Precautions, NPO status , Patient's Chart, lab work & pertinent test results  Airway Mallampati: II  TM Distance: >3 FB     Dental   Pulmonary asthma , former smoker,    breath sounds clear to auscultation       Cardiovascular hypertension, + CAD and + Peripheral Vascular Disease   Rhythm:Regular Rate:Normal     Neuro/Psych    GI/Hepatic Neg liver ROS, GERD  ,  Endo/Other  diabetes  Renal/GU Renal disease     Musculoskeletal   Abdominal   Peds  Hematology   Anesthesia Other Findings   Reproductive/Obstetrics                             Anesthesia Physical Anesthesia Plan  ASA: III  Anesthesia Plan: General   Post-op Pain Management:    Induction: Intravenous  PONV Risk Score and Plan: 2 and Treatment may vary due to age or medical condition, Ondansetron, Dexamethasone and Midazolam  Airway Management Planned: Oral ETT  Additional Equipment:   Intra-op Plan:   Post-operative Plan: Possible Post-op intubation/ventilation  Informed Consent: I have reviewed the patients History and Physical, chart, labs and discussed the procedure including the risks, benefits and alternatives for the proposed anesthesia with the patient or authorized representative who has indicated his/her understanding and acceptance.   Dental advisory given  Plan Discussed with: Anesthesiologist and CRNA  Anesthesia Plan Comments:       Anesthesia Quick Evaluation

## 2017-05-08 ENCOUNTER — Other Ambulatory Visit: Payer: 59

## 2017-05-08 ENCOUNTER — Other Ambulatory Visit: Payer: Self-pay

## 2017-05-08 ENCOUNTER — Ambulatory Visit: Payer: 59 | Admitting: Family Medicine

## 2017-05-08 ENCOUNTER — Encounter (HOSPITAL_COMMUNITY): Payer: Self-pay | Admitting: Neurosurgery

## 2017-05-08 ENCOUNTER — Ambulatory Visit: Payer: 59

## 2017-05-08 DIAGNOSIS — M50022 Cervical disc disorder at C5-C6 level with myelopathy: Secondary | ICD-10-CM | POA: Diagnosis not present

## 2017-05-08 LAB — GLUCOSE, CAPILLARY
Glucose-Capillary: 174 mg/dL — ABNORMAL HIGH (ref 65–99)
Glucose-Capillary: 318 mg/dL — ABNORMAL HIGH (ref 65–99)

## 2017-05-08 MED ORDER — DOCUSATE SODIUM 100 MG PO CAPS: 100.0000 mg | ORAL_CAPSULE | Freq: Two times a day (BID) | ORAL | 0 refills | Status: DC

## 2017-05-08 MED ORDER — OXYCODONE HCL 10 MG PO TABS: 10.0000 mg | ORAL_TABLET | ORAL | 0 refills | Status: DC | PRN

## 2017-05-08 NOTE — Progress Notes (Addendum)
Patient is discharged from room 3C10 at this time. Alert and in stable condition. IV site d/cd and instructions read to patient with understanding verbalized. Left unit via wheelchair with wife and all belongings at side.

## 2017-05-08 NOTE — Discharge Summary (Signed)
Physician Discharge Summary  Patient ID: Chase Clark MRN: [id number] DOB/AGE: Jul 01, 1965 52 y.o.  Admit date: 05/07/2017 Discharge date: 05/08/2017  Admission Diagnoses: C5-6 herniated disc, spondylosis, stenosis, cervical radiculopathy, cervical myelopathy  Discharge Diagnoses: The same Active Problems:   Cervical spondylosis with myelopathy and radiculopathy   Discharged Condition: good  Hospital Course: I performed a C5-6 anterior cervical discectomy, fusion and plating on the patient on 05/07/2017.  The surgery went well.  The patient's postoperative course was unremarkable.  On postoperative day #1 the patient requested discharge home.  The patient, and his wife, were given written and oral discharge instructions.  All their questions were answered.  Consults: Physical therapy Significant Diagnostic Studies: None Treatments: C5-6 anterior cervical discectomy, fusion and plating. Discharge Exam: Blood pressure (!) 151/97, pulse 68, temperature 97.8 F (36.6 C), temperature source Oral, resp. rate 20, weight 98.9 kg (218 lb), SpO2 100 %. The patient is alert and pleasant.  Strength is normal in all 4 extremities.  His dressing is clean and dry.  There is no hematoma or shift.  Disposition: Home  Discharge Instructions    Call MD for:  difficulty breathing, headache or visual disturbances   Complete by:  As directed    Call MD for:  extreme fatigue   Complete by:  As directed    Call MD for:  hives   Complete by:  As directed    Call MD for:  persistant dizziness or light-headedness   Complete by:  As directed    Call MD for:  persistant nausea and vomiting   Complete by:  As directed    Call MD for:  redness, tenderness, or signs of infection (pain, swelling, redness, odor or green/yellow discharge around incision site)   Complete by:  As directed    Call MD for:  severe uncontrolled pain   Complete by:  As directed    Call MD for:  temperature >100.4   Complete by:   As directed    Diet - low sodium heart healthy   Complete by:  As directed    Discharge instructions   Complete by:  As directed    Call [phone] for a followup appointment. Take a stool softener while you are using pain medications.   Driving Restrictions   Complete by:  As directed    Do not drive for 2 weeks.   Increase activity slowly   Complete by:  As directed    Lifting restrictions   Complete by:  As directed    Do not lift more than 5 pounds. No excessive bending or twisting.   May shower / Bathe   Complete by:  As directed    He may shower after the pain she is removed 3 days after surgery. Leave the incision alone.   Remove dressing in 48 hours   Complete by:  As directed    Your stitches are under the scan and will dissolve by themselves. The Steri-Strips will fall off after you take a few showers. Do not rub back or pick at the wound, Leave the wound alone.     Allergies as of 05/08/2017      Reactions   Atorvastatin Other (See Comments)   Myalgias      Medication List    STOP taking these medications   acetaminophen 500 MG tablet Commonly known as:  TYLENOL     TAKE these medications   amLODipine 5 MG tablet Commonly known as:  NORVASC Take 1  tablet (5 mg total) by mouth daily.   aspirin EC 81 MG tablet Take 81 mg by mouth daily.   COMBIGAN 0.2-0.5 % ophthalmic solution Generic drug:  brimonidine-timolol Place 1 drop into the right eye every 12 (twelve) hours.   docusate sodium 100 MG capsule Commonly known as:  COLACE Take 1 capsule (100 mg total) by mouth 2 (two) times daily.   doxycycline 100 MG tablet Commonly known as:  VIBRA-TABS Take 1 tablet (100 mg total) by mouth 2 (two) times daily.   DUREZOL 0.05 % Emul Generic drug:  Difluprednate APPLY 1 DROP(S) IN LEFT EYE 2 TIMES A DAY   esomeprazole 40 MG capsule Commonly known as:  NEXIUM Take 1 capsule (40 mg total) by mouth daily.   furosemide 40 MG tablet Commonly known as:   LASIX Take 40 mg by mouth as needed.   gabapentin 300 MG capsule Commonly known as:  NEURONTIN Take 2 capsules (600 mg total) by mouth 2 (two) times daily.   insulin aspart 100 UNIT/ML FlexPen Commonly known as:  NOVOLOG FLEXPEN Inject 10 units with breakfast, 10-15 units with lunch, and 15-20 units with supper What changed:    how much to take  how to take this  when to take this  additional instructions   insulin glargine 100 UNIT/ML injection Commonly known as:  LANTUS Inject 0.45 mLs (45 Units total) into the skin daily. What changed:    how much to take  additional instructions   latanoprost 0.005 % ophthalmic solution Commonly known as:  XALATAN Place 1 drop into both eyes at bedtime.   losartan-hydrochlorothiazide 100-25 MG tablet Commonly known as:  HYZAAR Take 1 tablet by mouth daily.   metFORMIN 500 MG tablet Commonly known as:  GLUCOPHAGE Take 1 tablet (500 mg total) by mouth 2 (two) times daily with a meal.   metoprolol tartrate 25 MG tablet Commonly known as:  LOPRESSOR Take 3 tablets (75 mg total) by mouth 2 (two) times daily.   multivitamin with minerals Tabs tablet Take 1 tablet by mouth daily.   Oxycodone HCl 10 MG Tabs Take 1 tablet (10 mg total) by mouth every 4 (four) hours as needed for severe pain ((score 7 to 10)).   Pen Needles 32G X 4 MM Misc Inject 90 each 3 (three) times daily into the skin.   PROBIOTIC PO Take 1 tablet by mouth daily.   rosuvastatin 20 MG tablet Commonly known as:  CRESTOR Take 1 tablet (20 mg total) daily by mouth.        Signed: Ophelia Charter 05/08/2017, 8:07 AM

## 2017-05-11 DIAGNOSIS — I10 Essential (primary) hypertension: Secondary | ICD-10-CM | POA: Diagnosis not present

## 2017-05-11 DIAGNOSIS — E1129 Type 2 diabetes mellitus with other diabetic kidney complication: Secondary | ICD-10-CM | POA: Diagnosis not present

## 2017-05-11 DIAGNOSIS — N182 Chronic kidney disease, stage 2 (mild): Secondary | ICD-10-CM | POA: Diagnosis not present

## 2017-05-19 DIAGNOSIS — E11610 Type 2 diabetes mellitus with diabetic neuropathic arthropathy: Secondary | ICD-10-CM | POA: Diagnosis not present

## 2017-05-19 DIAGNOSIS — E11621 Type 2 diabetes mellitus with foot ulcer: Secondary | ICD-10-CM | POA: Diagnosis not present

## 2017-05-19 DIAGNOSIS — L97422 Non-pressure chronic ulcer of left heel and midfoot with fat layer exposed: Secondary | ICD-10-CM | POA: Diagnosis not present

## 2017-05-19 DIAGNOSIS — E1142 Type 2 diabetes mellitus with diabetic polyneuropathy: Secondary | ICD-10-CM | POA: Diagnosis not present

## 2017-05-20 ENCOUNTER — Other Ambulatory Visit: Payer: Self-pay | Admitting: Family Medicine

## 2017-05-21 ENCOUNTER — Other Ambulatory Visit: Payer: Self-pay | Admitting: Family Medicine

## 2017-05-21 ENCOUNTER — Encounter: Payer: Self-pay | Admitting: Family Medicine

## 2017-05-21 DIAGNOSIS — E113513 Type 2 diabetes mellitus with proliferative diabetic retinopathy with macular edema, bilateral: Secondary | ICD-10-CM | POA: Diagnosis not present

## 2017-05-22 NOTE — Telephone Encounter (Signed)
Last OV 05/05/17, I do not see this on his med list

## 2017-05-23 NOTE — Telephone Encounter (Signed)
Sent to pharmacy. He had been taking this previously. He discussed it with his cardiologist. Please confirm with him whether or not he has continued to take this. Thanks.

## 2017-05-25 MED ORDER — GABAPENTIN 300 MG PO CAPS: 600.0000 mg | ORAL_CAPSULE | Freq: Two times a day (BID) | ORAL | 3 refills | Status: DC

## 2017-05-25 MED ORDER — METFORMIN HCL 500 MG PO TABS: 1000.0000 mg | ORAL_TABLET | Freq: Two times a day (BID) | ORAL | 3 refills | Status: DC

## 2017-05-25 MED ORDER — METOPROLOL TARTRATE 100 MG PO TABS: 100.0000 mg | ORAL_TABLET | Freq: Two times a day (BID) | ORAL | 1 refills | Status: DC

## 2017-05-25 NOTE — Telephone Encounter (Signed)
Left message to return call, ok for pec to speak to patient or patients wife to ask if he still takes plavix

## 2017-05-26 DIAGNOSIS — L97422 Non-pressure chronic ulcer of left heel and midfoot with fat layer exposed: Secondary | ICD-10-CM | POA: Diagnosis not present

## 2017-05-26 DIAGNOSIS — Z794 Long term (current) use of insulin: Secondary | ICD-10-CM | POA: Diagnosis not present

## 2017-05-26 DIAGNOSIS — E1142 Type 2 diabetes mellitus with diabetic polyneuropathy: Secondary | ICD-10-CM | POA: Diagnosis not present

## 2017-05-26 DIAGNOSIS — E11621 Type 2 diabetes mellitus with foot ulcer: Secondary | ICD-10-CM | POA: Diagnosis not present

## 2017-05-28 NOTE — Telephone Encounter (Signed)
Sent mychart message

## 2017-05-29 DIAGNOSIS — M4802 Spinal stenosis, cervical region: Secondary | ICD-10-CM | POA: Diagnosis not present

## 2017-06-02 DIAGNOSIS — L97422 Non-pressure chronic ulcer of left heel and midfoot with fat layer exposed: Secondary | ICD-10-CM | POA: Diagnosis not present

## 2017-06-02 DIAGNOSIS — E11621 Type 2 diabetes mellitus with foot ulcer: Secondary | ICD-10-CM | POA: Diagnosis not present

## 2017-06-02 DIAGNOSIS — E1142 Type 2 diabetes mellitus with diabetic polyneuropathy: Secondary | ICD-10-CM | POA: Diagnosis not present

## 2017-06-02 DIAGNOSIS — E11610 Type 2 diabetes mellitus with diabetic neuropathic arthropathy: Secondary | ICD-10-CM | POA: Diagnosis not present

## 2017-06-08 ENCOUNTER — Encounter: Payer: Self-pay | Admitting: Family Medicine

## 2017-06-08 ENCOUNTER — Telehealth: Payer: Self-pay

## 2017-06-08 NOTE — Telephone Encounter (Signed)
Copied from [place] [phone]. Topic: General - Other >> Jun 08, 2017  1:15 PM Yvette Rack wrote: Reason for CRM: patient wife called stating that her husband had an OV with [facility] today at 1:45 I advised pt that he didn't have an appt today and the pt was upset stating that her husband need to f/u with husbands provider about BP I gave him a new appt date on 06-22-17

## 2017-06-08 NOTE — Telephone Encounter (Signed)
noted 

## 2017-06-09 ENCOUNTER — Other Ambulatory Visit: Payer: Self-pay | Admitting: Family Medicine

## 2017-06-09 DIAGNOSIS — E11621 Type 2 diabetes mellitus with foot ulcer: Secondary | ICD-10-CM | POA: Diagnosis not present

## 2017-06-09 DIAGNOSIS — L97422 Non-pressure chronic ulcer of left heel and midfoot with fat layer exposed: Secondary | ICD-10-CM | POA: Diagnosis not present

## 2017-06-09 DIAGNOSIS — E1142 Type 2 diabetes mellitus with diabetic polyneuropathy: Secondary | ICD-10-CM | POA: Diagnosis not present

## 2017-06-09 DIAGNOSIS — E11610 Type 2 diabetes mellitus with diabetic neuropathic arthropathy: Secondary | ICD-10-CM | POA: Diagnosis not present

## 2017-06-09 MED ORDER — AMLODIPINE BESYLATE 5 MG PO TABS: 5.0000 mg | ORAL_TABLET | Freq: Every day | ORAL | 3 refills | Status: DC

## 2017-06-09 MED ORDER — METFORMIN HCL 500 MG PO TABS: 1000.0000 mg | ORAL_TABLET | Freq: Two times a day (BID) | ORAL | 3 refills | Status: DC

## 2017-06-12 ENCOUNTER — Encounter: Payer: Self-pay | Admitting: *Deleted

## 2017-06-22 ENCOUNTER — Ambulatory Visit: Payer: 59 | Admitting: Family Medicine

## 2017-06-23 ENCOUNTER — Ambulatory Visit: Payer: 59 | Admitting: Internal Medicine

## 2017-06-30 ENCOUNTER — Encounter: Payer: Self-pay | Admitting: Family Medicine

## 2017-06-30 MED ORDER — AMLODIPINE BESYLATE 10 MG PO TABS: 10.0000 mg | ORAL_TABLET | Freq: Every day | ORAL | 3 refills | Status: DC

## 2017-07-07 DIAGNOSIS — Z794 Long term (current) use of insulin: Secondary | ICD-10-CM | POA: Diagnosis not present

## 2017-07-07 DIAGNOSIS — L97422 Non-pressure chronic ulcer of left heel and midfoot with fat layer exposed: Secondary | ICD-10-CM | POA: Diagnosis not present

## 2017-07-07 DIAGNOSIS — E1142 Type 2 diabetes mellitus with diabetic polyneuropathy: Secondary | ICD-10-CM | POA: Diagnosis not present

## 2017-07-07 DIAGNOSIS — E11610 Type 2 diabetes mellitus with diabetic neuropathic arthropathy: Secondary | ICD-10-CM | POA: Diagnosis not present

## 2017-07-07 DIAGNOSIS — E11621 Type 2 diabetes mellitus with foot ulcer: Secondary | ICD-10-CM | POA: Diagnosis not present

## 2017-07-09 ENCOUNTER — Other Ambulatory Visit: Payer: Self-pay

## 2017-07-09 ENCOUNTER — Ambulatory Visit: Payer: BLUE CROSS/BLUE SHIELD | Admitting: Family Medicine

## 2017-07-09 ENCOUNTER — Encounter: Payer: Self-pay | Admitting: Family Medicine

## 2017-07-09 VITALS — BP 120/80 | HR 63 | Temp 97.9°F | Ht 70.0 in | Wt 206.8 lb

## 2017-07-09 DIAGNOSIS — E11621 Type 2 diabetes mellitus with foot ulcer: Secondary | ICD-10-CM

## 2017-07-09 DIAGNOSIS — I10 Essential (primary) hypertension: Secondary | ICD-10-CM | POA: Diagnosis not present

## 2017-07-09 DIAGNOSIS — IMO0002 Reserved for concepts with insufficient information to code with codable children: Secondary | ICD-10-CM

## 2017-07-09 DIAGNOSIS — M5030 Other cervical disc degeneration, unspecified cervical region: Secondary | ICD-10-CM

## 2017-07-09 DIAGNOSIS — E1139 Type 2 diabetes mellitus with other diabetic ophthalmic complication: Secondary | ICD-10-CM | POA: Diagnosis not present

## 2017-07-09 DIAGNOSIS — E1165 Type 2 diabetes mellitus with hyperglycemia: Secondary | ICD-10-CM | POA: Diagnosis not present

## 2017-07-09 DIAGNOSIS — L97425 Non-pressure chronic ulcer of left heel and midfoot with muscle involvement without evidence of necrosis: Secondary | ICD-10-CM

## 2017-07-09 NOTE — Patient Instructions (Signed)
Nice to see you. We will have you return for lab work tomorrow. Please contact your neurosurgeon just update them on your symptoms.

## 2017-07-09 NOTE — Assessment & Plan Note (Signed)
Poor control. He will return for A1c. Likely will need to see our clinical pharmacist again.

## 2017-07-09 NOTE — Progress Notes (Signed)
[name], MD Phone: [phone]  BROC CASPERS is a 52 y.o. male who presents today for f/u.  HYPERTENSION  Disease Monitoring  Home BP Monitoring 120s-130/70-80  Chest pain- no    Dyspnea- no Medications  Compliance-  Taking amlodipine, losartan, hctz, metoprolol.  Edema- some related to chronic foot wound, no increased edema  DIABETES Disease Monitoring: Blood Sugar ranges-200 Polyuria/phagia/dipsia- dipsia       Medications: Compliance- taking novolog 15-20 SSI with meals, lantus 50 units, metformin  He continues to follow with wound care for his left heel ulcer.  They are working to find him a shoe that fits.  He notes there have been no signs of infection.  He saw them earlier this week.  Not currently on any antibiotics.  He had his neck surgery with fusion of C5-6.  Notes the neck pain is improved.  Still gets some pain down his shoulder blade and into his posterior right arm.  Notes he followed up and about 3 weeks after the surgery he was doing relatively well though has not progressed since then.  When he stretches the arm it feels like he gets more discomfort.  He did have a fall last week but did not injure his neck.  He did hit his shoulder and was a little sore on the left.  He was sitting down onto the toilet and slipped with his socks on.  He had no head injury or loss of consciousness.     Social History   Tobacco Use  Smoking Status Former Smoker  . Types: Cigarettes  . Last attempt to quit: 03/14/2008  . Years since quitting: 9.3  Smokeless Tobacco Never Used     ROS see history of present illness  Objective  Physical Exam Vitals:   07/09/17 0943  BP: 120/80  Pulse: 63  Temp: 97.9 F (36.6 C)  SpO2: 99%    BP Readings from Last 3 Encounters:  07/09/17 120/80  05/08/17 (!) 151/97  05/05/17 (!) 164/90   Wt Readings from Last 3 Encounters:  07/09/17 206 lb 12.8 oz (93.8 kg)  05/08/17 218 lb (98.9 kg)  05/05/17 218 lb 6.4 oz (99.1 kg)     Physical Exam  Constitutional: No distress.  Cardiovascular: Normal rate, regular rhythm and normal heart sounds.  Pulmonary/Chest: Effort normal and breath sounds normal.  Musculoskeletal:  No midline neck tenderness, no midline neck step-off, no muscular neck tenderness, full range of motion right shoulder with no discomfort, 5/5 strength bilateral biceps, triceps, and grip, sensation light touch intact bilateral upper extremities  Neurological: He is alert. Gait normal.  Skin: Skin is warm and dry. He is not diaphoretic.     Assessment/Plan: Please see individual problem list.  Essential hypertension Finally under control. He will continue his current regimen. Return for labs.   DM (diabetes mellitus), type 2, uncontrolled w/ophthalmic complication Poor control. He will return for A1c. Likely will need to see our clinical pharmacist again.   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 Improving per patient report. He will continue with wound care management.  DDD (degenerative disc disease), cervical Neck pain is improved. Still with some radicular symptoms. He will let his surgeon know and follow up with them.    Orders Placed This Encounter  Procedures  . Hemoglobin A1c    Standing Status:   Future    Standing Expiration Date:   07/10/2018  . Comp Met (CMET)  Standing Status:   Future    Standing Expiration Date:   07/10/2018    No orders of the defined types were placed in this encounter.    [name], MD [place]

## 2017-07-09 NOTE — Assessment & Plan Note (Signed)
Finally under control. He will continue his current regimen. Return for labs.

## 2017-07-09 NOTE — Assessment & Plan Note (Signed)
Neck pain is improved. Still with some radicular symptoms. He will let his surgeon know and follow up with them.

## 2017-07-09 NOTE — Assessment & Plan Note (Signed)
Chronic issue. Improving per patient report. He will continue with wound care management.

## 2017-07-13 ENCOUNTER — Other Ambulatory Visit: Payer: BLUE CROSS/BLUE SHIELD

## 2017-07-30 ENCOUNTER — Ambulatory Visit: Payer: 59 | Admitting: Family Medicine

## 2017-08-04 DIAGNOSIS — E11610 Type 2 diabetes mellitus with diabetic neuropathic arthropathy: Secondary | ICD-10-CM | POA: Diagnosis not present

## 2017-08-04 DIAGNOSIS — E1142 Type 2 diabetes mellitus with diabetic polyneuropathy: Secondary | ICD-10-CM | POA: Diagnosis not present

## 2017-08-04 DIAGNOSIS — E11621 Type 2 diabetes mellitus with foot ulcer: Secondary | ICD-10-CM | POA: Diagnosis not present

## 2017-08-04 DIAGNOSIS — L97422 Non-pressure chronic ulcer of left heel and midfoot with fat layer exposed: Secondary | ICD-10-CM | POA: Diagnosis not present

## 2017-08-23 ENCOUNTER — Inpatient Hospital Stay (HOSPITAL_COMMUNITY): Payer: BLUE CROSS/BLUE SHIELD

## 2017-08-23 ENCOUNTER — Emergency Department (HOSPITAL_COMMUNITY): Payer: BLUE CROSS/BLUE SHIELD

## 2017-08-23 ENCOUNTER — Inpatient Hospital Stay (HOSPITAL_COMMUNITY)
Admission: EM | Admit: 2017-08-23 | Discharge: 2017-08-28 | DRG: 638 | Disposition: A | Discharge status: Home / Self Care | Payer: BLUE CROSS/BLUE SHIELD | Attending: Internal Medicine | Admitting: Internal Medicine

## 2017-08-23 DIAGNOSIS — R404 Transient alteration of awareness: Secondary | ICD-10-CM | POA: Diagnosis not present

## 2017-08-23 DIAGNOSIS — I2510 Atherosclerotic heart disease of native coronary artery without angina pectoris: Secondary | ICD-10-CM | POA: Diagnosis present

## 2017-08-23 DIAGNOSIS — E1139 Type 2 diabetes mellitus with other diabetic ophthalmic complication: Secondary | ICD-10-CM

## 2017-08-23 DIAGNOSIS — E1140 Type 2 diabetes mellitus with diabetic neuropathy, unspecified: Secondary | ICD-10-CM | POA: Diagnosis present

## 2017-08-23 DIAGNOSIS — E1010 Type 1 diabetes mellitus with ketoacidosis without coma: Secondary | ICD-10-CM

## 2017-08-23 DIAGNOSIS — D631 Anemia in chronic kidney disease: Secondary | ICD-10-CM | POA: Diagnosis not present

## 2017-08-23 DIAGNOSIS — Z79899 Other long term (current) drug therapy: Secondary | ICD-10-CM

## 2017-08-23 DIAGNOSIS — Z794 Long term (current) use of insulin: Secondary | ICD-10-CM

## 2017-08-23 DIAGNOSIS — K760 Fatty (change of) liver, not elsewhere classified: Secondary | ICD-10-CM | POA: Diagnosis present

## 2017-08-23 DIAGNOSIS — E11649 Type 2 diabetes mellitus with hypoglycemia without coma: Secondary | ICD-10-CM | POA: Diagnosis not present

## 2017-08-23 DIAGNOSIS — Z9119 Patient's noncompliance with other medical treatment and regimen: Secondary | ICD-10-CM

## 2017-08-23 DIAGNOSIS — Z87891 Personal history of nicotine dependence: Secondary | ICD-10-CM | POA: Diagnosis not present

## 2017-08-23 DIAGNOSIS — N183 Chronic kidney disease, stage 3 (moderate): Secondary | ICD-10-CM | POA: Diagnosis not present

## 2017-08-23 DIAGNOSIS — L97529 Non-pressure chronic ulcer of other part of left foot with unspecified severity: Secondary | ICD-10-CM | POA: Diagnosis present

## 2017-08-23 DIAGNOSIS — IMO0002 Reserved for concepts with insufficient information to code with codable children: Secondary | ICD-10-CM

## 2017-08-23 DIAGNOSIS — E875 Hyperkalemia: Secondary | ICD-10-CM | POA: Diagnosis present

## 2017-08-23 DIAGNOSIS — E876 Hypokalemia: Secondary | ICD-10-CM | POA: Diagnosis not present

## 2017-08-23 DIAGNOSIS — L8990 Pressure ulcer of unspecified site, unspecified stage: Secondary | ICD-10-CM

## 2017-08-23 DIAGNOSIS — Z955 Presence of coronary angioplasty implant and graft: Secondary | ICD-10-CM

## 2017-08-23 DIAGNOSIS — N179 Acute kidney failure, unspecified: Secondary | ICD-10-CM | POA: Diagnosis not present

## 2017-08-23 DIAGNOSIS — H409 Unspecified glaucoma: Secondary | ICD-10-CM | POA: Diagnosis present

## 2017-08-23 DIAGNOSIS — E785 Hyperlipidemia, unspecified: Secondary | ICD-10-CM | POA: Diagnosis present

## 2017-08-23 DIAGNOSIS — S91302A Unspecified open wound, left foot, initial encounter: Secondary | ICD-10-CM | POA: Diagnosis not present

## 2017-08-23 DIAGNOSIS — K219 Gastro-esophageal reflux disease without esophagitis: Secondary | ICD-10-CM | POA: Diagnosis present

## 2017-08-23 DIAGNOSIS — Z7982 Long term (current) use of aspirin: Secondary | ICD-10-CM | POA: Diagnosis not present

## 2017-08-23 DIAGNOSIS — I129 Hypertensive chronic kidney disease with stage 1 through stage 4 chronic kidney disease, or unspecified chronic kidney disease: Secondary | ICD-10-CM | POA: Diagnosis not present

## 2017-08-23 DIAGNOSIS — R531 Weakness: Secondary | ICD-10-CM | POA: Diagnosis not present

## 2017-08-23 DIAGNOSIS — E1165 Type 2 diabetes mellitus with hyperglycemia: Secondary | ICD-10-CM

## 2017-08-23 DIAGNOSIS — R000 Tachycardia, unspecified: Secondary | ICD-10-CM | POA: Diagnosis not present

## 2017-08-23 DIAGNOSIS — R112 Nausea with vomiting, unspecified: Secondary | ICD-10-CM | POA: Diagnosis not present

## 2017-08-23 DIAGNOSIS — L89629 Pressure ulcer of left heel, unspecified stage: Secondary | ICD-10-CM | POA: Diagnosis not present

## 2017-08-23 DIAGNOSIS — E11621 Type 2 diabetes mellitus with foot ulcer: Secondary | ICD-10-CM | POA: Diagnosis present

## 2017-08-23 DIAGNOSIS — L89620 Pressure ulcer of left heel, unstageable: Secondary | ICD-10-CM | POA: Diagnosis not present

## 2017-08-23 DIAGNOSIS — E1110 Type 2 diabetes mellitus with ketoacidosis without coma: Secondary | ICD-10-CM | POA: Diagnosis not present

## 2017-08-23 DIAGNOSIS — D649 Anemia, unspecified: Secondary | ICD-10-CM | POA: Diagnosis not present

## 2017-08-23 DIAGNOSIS — Z981 Arthrodesis status: Secondary | ICD-10-CM

## 2017-08-23 DIAGNOSIS — E1122 Type 2 diabetes mellitus with diabetic chronic kidney disease: Secondary | ICD-10-CM | POA: Diagnosis present

## 2017-08-23 DIAGNOSIS — I10 Essential (primary) hypertension: Secondary | ICD-10-CM | POA: Diagnosis not present

## 2017-08-23 DIAGNOSIS — R5383 Other fatigue: Secondary | ICD-10-CM | POA: Diagnosis not present

## 2017-08-23 HISTORY — DX: Type 2 diabetes mellitus with ketoacidosis without coma: E11.10

## 2017-08-23 LAB — BASIC METABOLIC PANEL
Anion gap: 20 — ABNORMAL HIGH (ref 5–15)
BUN: 38 mg/dL — ABNORMAL HIGH (ref 6–20)
CO2: 13 mmol/L — ABNORMAL LOW (ref 22–32)
Calcium: 9 mg/dL (ref 8.9–10.3)
Chloride: 106 mmol/L (ref 101–111)
Creatinine, Ser: 1.82 mg/dL — ABNORMAL HIGH (ref 0.61–1.24)
GFR calc Af Amer: 47 mL/min — ABNORMAL LOW (ref >60)
GFR calc non Af Amer: 40 mL/min — ABNORMAL LOW (ref >60)
Glucose, Bld: 241 mg/dL — ABNORMAL HIGH (ref 65–99)
Potassium: 4 mmol/L (ref 3.5–5.1)
Sodium: 139 mmol/L (ref 135–145)

## 2017-08-23 LAB — COMPREHENSIVE METABOLIC PANEL
ALT: 12 U/L — ABNORMAL LOW (ref 17–63)
AST: 14 U/L — ABNORMAL LOW (ref 15–41)
Albumin: 3.1 g/dL — ABNORMAL LOW (ref 3.5–5.0)
Alkaline Phosphatase: 118 U/L (ref 38–126)
Anion gap: 35 — ABNORMAL HIGH (ref 5–15)
BUN: 44 mg/dL — ABNORMAL HIGH (ref 6–20)
CO2: 9 mmol/L — ABNORMAL LOW (ref 22–32)
Calcium: 10.1 mg/dL (ref 8.9–10.3)
Chloride: 89 mmol/L — ABNORMAL LOW (ref 101–111)
Creatinine, Ser: 2.34 mg/dL — ABNORMAL HIGH (ref 0.61–1.24)
GFR calc Af Amer: 35 mL/min — ABNORMAL LOW (ref >60)
GFR calc non Af Amer: 30 mL/min — ABNORMAL LOW (ref >60)
Glucose, Bld: 652 mg/dL (ref 65–99)
Potassium: 5.2 mmol/L — ABNORMAL HIGH (ref 3.5–5.1)
Sodium: 133 mmol/L — ABNORMAL LOW (ref 135–145)
Total Bilirubin: 2.3 mg/dL — ABNORMAL HIGH (ref 0.3–1.2)
Total Protein: 8.7 g/dL — ABNORMAL HIGH (ref 6.5–8.1)

## 2017-08-23 LAB — PHOSPHORUS: Phosphorus: 3.5 mg/dL (ref 2.5–4.6)

## 2017-08-23 LAB — CBG MONITORING, ED
Glucose-Capillary: 571 mg/dL (ref 65–99)
Glucose-Capillary: 528 mg/dL (ref 65–99)
Glucose-Capillary: 459 mg/dL — ABNORMAL HIGH (ref 65–99)

## 2017-08-23 LAB — PROTIME-INR
INR: 1.2
Prothrombin Time: 15.1 seconds (ref 11.4–15.2)

## 2017-08-23 LAB — I-STAT TROPONIN, ED: Troponin i, poc: 0.01 ng/mL (ref 0.00–0.08)

## 2017-08-23 LAB — GLUCOSE, CAPILLARY
Glucose-Capillary: 157 mg/dL — ABNORMAL HIGH (ref 65–99)
Glucose-Capillary: 197 mg/dL — ABNORMAL HIGH (ref 65–99)
Glucose-Capillary: 216 mg/dL — ABNORMAL HIGH (ref 65–99)
Glucose-Capillary: 242 mg/dL — ABNORMAL HIGH (ref 65–99)
Glucose-Capillary: 284 mg/dL — ABNORMAL HIGH (ref 65–99)
Glucose-Capillary: 325 mg/dL — ABNORMAL HIGH (ref 65–99)

## 2017-08-23 LAB — I-STAT CG4 LACTIC ACID, ED: Lactic Acid, Venous: 3.17 mmol/L (ref 0.5–1.9)

## 2017-08-23 LAB — BLOOD GAS, ARTERIAL
Drawn by: 103701
FIO2: 21
O2 Saturation: 97.1 %
Patient temperature: 98.6
pH, Arterial: 7.175 — CL (ref 7.350–7.450)
pO2, Arterial: 133 mmHg — ABNORMAL HIGH (ref 83.0–108.0)

## 2017-08-23 LAB — RAPID URINE DRUG SCREEN, HOSP PERFORMED
Amphetamines: NOT DETECTED
Barbiturates: NOT DETECTED
Benzodiazepines: NOT DETECTED
Cocaine: NOT DETECTED
Opiates: NOT DETECTED
Tetrahydrocannabinol: NOT DETECTED

## 2017-08-23 LAB — MAGNESIUM: Magnesium: 2 mg/dL (ref 1.7–2.4)

## 2017-08-23 LAB — LIPASE, BLOOD: Lipase: 46 U/L (ref 11–51)

## 2017-08-23 LAB — PROCALCITONIN: Procalcitonin: 0.89 ng/mL

## 2017-08-23 MED ORDER — PIPERACILLIN-TAZOBACTAM 3.375 G IVPB 30 MIN
3.3750 g | Freq: Once | INTRAVENOUS | Status: AC
  Administered 2017-08-23: 3.375 g via INTRAVENOUS
  Filled 2017-08-23: qty 50

## 2017-08-23 MED ORDER — SODIUM CHLORIDE 0.9 % IV SOLN
INTRAVENOUS | Status: DC
  Administered 2017-08-23: 10 [IU]/h via INTRAVENOUS
  Administered 2017-08-23: 5.4 [IU]/h via INTRAVENOUS
  Filled 2017-08-23: qty 1

## 2017-08-23 MED ORDER — SODIUM CHLORIDE 0.9 % IV BOLUS
1000.0000 mL | Freq: Once | INTRAVENOUS | Status: AC
  Administered 2017-08-23: 1000 mL via INTRAVENOUS

## 2017-08-23 MED ORDER — PIPERACILLIN-TAZOBACTAM 3.375 G IVPB
3.3750 g | Freq: Three times a day (TID) | INTRAVENOUS | Status: DC
  Administered 2017-08-24: 3.375 g via INTRAVENOUS
  Filled 2017-08-23: qty 50

## 2017-08-23 MED ORDER — HYDRALAZINE HCL 20 MG/ML IJ SOLN
5.0000 mg | Freq: Four times a day (QID) | INTRAMUSCULAR | Status: DC | PRN
  Administered 2017-08-23: 5 mg via INTRAVENOUS
  Filled 2017-08-23: qty 1

## 2017-08-23 MED ORDER — DEXTROSE-NACL 5-0.45 % IV SOLN
INTRAVENOUS | Status: DC
Start: 2017-08-23 — End: 2017-08-24

## 2017-08-23 MED ORDER — PROMETHAZINE HCL 25 MG/ML IJ SOLN
25.0000 mg | Freq: Once | INTRAMUSCULAR | Status: AC
  Administered 2017-08-23: 25 mg via INTRAVENOUS
  Filled 2017-08-23: qty 1

## 2017-08-23 MED ORDER — ENOXAPARIN SODIUM 40 MG/0.4ML ~~LOC~~ SOLN
40.0000 mg | SUBCUTANEOUS | Status: DC
  Administered 2017-08-23 – 2017-08-27 (×5): 40 mg via SUBCUTANEOUS
  Filled 2017-08-23 (×5): qty 0.4

## 2017-08-23 MED ORDER — SODIUM CHLORIDE 0.9 % IV SOLN
Freq: Once | INTRAVENOUS | Status: AC
  Administered 2017-08-23: 16:00:00 via INTRAVENOUS

## 2017-08-23 MED ORDER — DEXTROSE-NACL 5-0.45 % IV SOLN
INTRAVENOUS | Status: DC
  Administered 2017-08-23 – 2017-08-25 (×2): via INTRAVENOUS

## 2017-08-23 MED ORDER — PROMETHAZINE HCL 25 MG/ML IJ SOLN
12.5000 mg | Freq: Four times a day (QID) | INTRAMUSCULAR | Status: DC | PRN
  Administered 2017-08-23 (×2): 12.5 mg via INTRAVENOUS
  Filled 2017-08-23 (×2): qty 1

## 2017-08-23 MED ORDER — VANCOMYCIN HCL IN DEXTROSE 1-5 GM/200ML-% IV SOLN: 1000.0000 mg | INTRAVENOUS | Status: DC

## 2017-08-23 MED ORDER — INSULIN ASPART 100 UNIT/ML ~~LOC~~ SOLN
10.0000 [IU] | Freq: Once | SUBCUTANEOUS | Status: AC
  Administered 2017-08-23: 10 [IU] via INTRAVENOUS

## 2017-08-23 MED ORDER — SODIUM CHLORIDE 0.9 % IV SOLN
INTRAVENOUS | Status: DC
  Administered 2017-08-23: 9.6 [IU]/h via INTRAVENOUS
  Administered 2017-08-23: 8 [IU]/h via INTRAVENOUS
  Administered 2017-08-25: 3.8 [IU]/h via INTRAVENOUS
  Filled 2017-08-23 (×3): qty 1

## 2017-08-23 MED ORDER — ONDANSETRON HCL 4 MG/2ML IJ SOLN
4.0000 mg | Freq: Four times a day (QID) | INTRAMUSCULAR | Status: DC | PRN
  Administered 2017-08-23 – 2017-08-24 (×2): 4 mg via INTRAVENOUS
  Filled 2017-08-23 (×2): qty 2

## 2017-08-23 MED ORDER — METOCLOPRAMIDE HCL 5 MG/ML IJ SOLN
20.0000 mg | Freq: Once | INTRAMUSCULAR | Status: AC
  Administered 2017-08-24: 20 mg via INTRAVENOUS
  Filled 2017-08-23: qty 4

## 2017-08-23 MED ORDER — VANCOMYCIN HCL IN DEXTROSE 1-5 GM/200ML-% IV SOLN
1000.0000 mg | Freq: Once | INTRAVENOUS | Status: AC
  Administered 2017-08-23: 1000 mg via INTRAVENOUS
  Filled 2017-08-23: qty 200

## 2017-08-23 MED ORDER — FAMOTIDINE IN NACL 20-0.9 MG/50ML-% IV SOLN
20.0000 mg | Freq: Two times a day (BID) | INTRAVENOUS | Status: DC
  Administered 2017-08-23 – 2017-08-24 (×3): 20 mg via INTRAVENOUS
  Filled 2017-08-23 (×3): qty 50

## 2017-08-23 MED ORDER — SODIUM CHLORIDE 0.9 % IV SOLN: INTRAVENOUS | Status: DC

## 2017-08-23 MED ORDER — ONDANSETRON HCL 4 MG/2ML IJ SOLN
4.0000 mg | Freq: Once | INTRAMUSCULAR | Status: AC
  Administered 2017-08-23: 4 mg via INTRAVENOUS
  Filled 2017-08-23: qty 2

## 2017-08-23 MED ORDER — VANCOMYCIN HCL IN DEXTROSE 1-5 GM/200ML-% IV SOLN: 1000.0000 mg | Freq: Once | INTRAVENOUS | Status: DC

## 2017-08-23 MED ORDER — METOCLOPRAMIDE HCL 5 MG/ML IJ SOLN
10.0000 mg | Freq: Once | INTRAMUSCULAR | Status: AC
  Administered 2017-08-23: 10 mg via INTRAVENOUS
  Filled 2017-08-23: qty 2

## 2017-08-23 MED ORDER — SCOPOLAMINE 1 MG/3DAYS TD PT72
1.0000 | MEDICATED_PATCH | TRANSDERMAL | Status: DC
  Administered 2017-08-24 – 2017-08-27 (×2): 1.5 mg via TRANSDERMAL
  Filled 2017-08-23 (×2): qty 1

## 2017-08-23 NOTE — Progress Notes (Signed)
Pharmacy Antibiotic Note  Chase Clark is a 52 y.o. male admitted on 08/23/2017 with wound infection.  Pharmacy has been consulted for vancomycin/Zosyn dosing.  Plan: 1) Vancomycin 1g IV x 1 given in ER, give extra 1g x 1 to = 2g for loading dose. Then start 1g IV q24 - goal AUC 400-500 2) Zosyn 3.375g IV q8 (extended interval infusion)  Height: 5\' 11"  (180.3 cm) Weight: 215 lb (97.5 kg) IBW/kg (Calculated) : 75.3  No data recorded.  Recent Labs  Lab 08/23/17 1407 08/23/17 1409  WBC 11.8*  --   CREATININE 2.34*  --   LATICACIDVEN  --  3.17*    Estimated Creatinine Clearance: 43 mL/min (A) (by C-G formula based on SCr of 2.34 mg/dL (H)).    Allergies  Allergen Reactions  . Atorvastatin Other (See Comments)    Myalgias      Thank you for allowing pharmacy to be a part of this patient's care.   Adrian Saran, PharmD, BCPS Pager [phone] 08/23/2017 4:45 PM

## 2017-08-23 NOTE — H&P (Addendum)
HPI  Chase Clark KZL:[id number] DOB: 1965/11/25 DOA: 08/23/2017  PCP: [name], MD   Chief Complaint: Nausea discomfort vomiting  HPI:  52 year old male history of  cervical spondylosis C5-6 status post surgery , HTN,  chronic diarrhea status post cholecystectomy 4/13 followed in [place] Dr. Marius Ditch  diabetes mellitus type 2 for the past 25 years on insulin  chronic left foot ulcer that was followed by Dr. Barbaraann Cao [facility] and had an unsuccessful angio done sometime in the last year  -he currently gets his wound debrided by [place] [facility] physician every other week for the last 3 or 4 weeks by Dr. Linton Rump  Admit 5/12 nausea vomiting--has been having 3 to 4 days of nausea vomiting and has not really eaten in the past couple of days only having soup and liquids When they checked his labs his sugar was reading in the 600 range Wound has been looking clean he has not had any new issues with it was debrided couple of days ago and he has not noticed any oozing-his wife is an Therapist, sports and dress it regularly for him   ED Course: Patient was given aggressive IV saline resuscitation, x-ray was performed--he was given 4 mg of Zofran on route in the ambulance 10 of Reglan 2 L and another 4 of Zofran with no relief of his nausea His last sugar at 1418 was 571  Review of Systems:   Patient experiences significant nausea and discomfort he is diaphoretic and he overall is ill appearing  fever, visual changes, sore throat, rash, new muscle aches, chest pain, SOB, dysuria, bleeding, n/v/abdominal pain.  Past Medical History:  Diagnosis Date  . Arthritis   . Asthma    as child  . Coronary artery disease    DES DIAG1 11/20/09 [facility] [address])  . Diabetes mellitus without complication ([place])   . Diverticulosis   . Edema, lower extremity   . Fatty liver   . GERD (gastroesophageal reflux disease)   . Gilbert's disease   . Glaucoma   . Heart disease 2011   Patient has stent for 80%  blockage  . Hyperlipidemia   . Hypertension   . Neuropathy   . Seasonal allergies   . Shoulder pain   . Ulcer of foot ([place]) 08.06.2014   DIABETIC ULCERATIONS ASSOCIATED WITH IRRITATION LATERAL ANKLE LEFT GREATER THAN RIGHT WITH MILD CELLULITIS    Past Surgical History:  Procedure Laterality Date  . ABDOMINAL AORTOGRAM N/A 05/20/2016   Procedure: Abdominal Aortogram possible intervention;  Surgeon: Katha Cabal, MD;  Location: [place] [facility];  Service: Cardiovascular;  Laterality: N/A;  . ANTERIOR CERVICAL DECOMP/DISCECTOMY FUSION N/A 05/07/2017   Procedure: ANTERIOR CERVICAL DECOMPRESSION/DISCECTOMY Luevenia Maxin PROSTHESIS,PLATE/SCREWS CERVICAL FIVE - CERVICAL SIX;  Surgeon: Newman Pies, MD;  Location: [facility];  Service: Neurosurgery;  Laterality: N/A;  . CARDIAC CATHETERIZATION    . CATARACT EXTRACTION W/ INTRAOCULAR LENS IMPLANT    . CATARACT EXTRACTION W/PHACO Left 02/26/2017   Procedure: CATARACT EXTRACTION PHACO AND INTRAOCULAR LENS PLACEMENT (IOC);  Surgeon: Eulogio Bear, MD;  Location: [facility];  Service: Ophthalmology;  Laterality: Left;  Lot # [id number] H [place]: 00:25.3 AP%: 6.2 CDE: 1.59   . CHOLECYSTECTOMY N/A   . CORONARY ANGIOPLASTY WITH STENT PLACEMENT  2007   1st diagonal  . EPIBLEPHERON REPAIR WITH TEAR DUCT PROBING    . EYE SURGERY Right sept 29n2015   cataract extraction  . INSERTION EXPRESS TUBE SHUNT Right 09/06/2015   Procedure: INSERTION AHMED TUBE  SHUNT with tutoplast allograft;  Surgeon: Eulogio Bear, MD;  Location: [facility];  Service: Ophthalmology;  Laterality: Right;  Marland Kitchen VASECTOMY       reports that he quit smoking about 9 years ago. His smoking use included cigarettes. He has never used smokeless tobacco. He reports that he drinks alcohol. He reports that he does not use drugs. Mobility: Independent at baseline   Allergies  Allergen Reactions  . Atorvastatin Other (See Comments)    Myalgias     Family History  Problem  Relation Age of Onset  . Hyperlipidemia Mother   . Diabetes Mother   . Liver disease Mother        NASH  . Hyperlipidemia Father   . Diabetes Father   . Heart disease Father   . Hypertension Father      Prior to Admission medications   Medication Sig Start Date End Date Taking? Authorizing Provider  amLODipine (NORVASC) 10 MG tablet Take 1 tablet (10 mg total) by mouth daily. 06/30/17  Yes [name], MD  aspirin EC 81 MG tablet Take 81 mg by mouth daily.   Yes [provider]  brimonidine-timolol (COMBIGAN) 0.2-0.5 % ophthalmic solution Place 1 drop into the right eye every 12 (twelve) hours.    Yes [provider]  DUREZOL 0.05 % EMUL APPLY 1 DROP(S) IN LEFT EYE qhs 03/27/17  Yes [provider]  esomeprazole (NEXIUM) 40 MG capsule Take 1 capsule (40 mg total) by mouth daily. Patient taking differently: Take 40 mg by mouth daily.  05/21/16  Yes Wieting, Richard, MD  furosemide (LASIX) 40 MG tablet Take 40 mg by mouth as needed for fluid or edema.  04/29/17  Yes [provider]  gabapentin (NEURONTIN) 300 MG capsule Take 2 capsules (600 mg total) by mouth 2 (two) times daily. 05/25/17  Yes [name], MD  insulin aspart (NOVOLOG FLEXPEN) 100 UNIT/ML FlexPen Inject 10 units with breakfast, 10-15 units with lunch, and 15-20 units with supper Patient taking differently: Inject 10-20 Units into the skin 3 (three) times daily with meals. Inject 10 units with breakfast, 10-15 units with lunch, and 15-20 units with supper 02/23/17  Yes [name], MD  insulin glargine (LANTUS) 100 UNIT/ML injection Inject 0.45 mLs (45 Units total) into the skin daily. Patient taking differently: Inject 50 Units into the skin daily. Takes in the morning 10/10/16  Yes [name], MD  Insulin Pen Needle (PEN NEEDLES) 32G X 4 MM MISC Inject 90 each 3 (three) times daily into the skin. 02/23/17  Yes [name], MD  latanoprost (XALATAN) 0.005 %  ophthalmic solution Place 1 drop into both eyes at bedtime. 03/03/17  Yes [provider]  losartan-hydrochlorothiazide (HYZAAR) 100-25 MG tablet Take 1 tablet by mouth daily. 07/15/15  Yes [provider]  metFORMIN (GLUCOPHAGE) 500 MG tablet Take 2 tablets (1,000 mg total) by mouth 2 (two) times daily with a meal. 06/09/17  Yes [name], MD  metoprolol tartrate (LOPRESSOR) 100 MG tablet Take 1 tablet (100 mg total) by mouth 2 (two) times daily. 05/25/17  Yes [name], MD  Multiple Vitamin (MULTIVITAMIN WITH MINERALS) TABS tablet Take 1 tablet by mouth daily.   Yes [provider]  oxyCODONE 10 MG TABS Take 1 tablet (10 mg total) by mouth every 4 (four) hours as needed for severe pain ((score 7 to 10)). 05/08/17  Yes Newman Pies, MD  rosuvastatin (CRESTOR) 20 MG tablet Take 1  tablet (20 mg total) daily by mouth. 02/23/17  Yes [name], MD  docusate sodium (COLACE) 100 MG capsule Take 1 capsule (100 mg total) by mouth 2 (two) times daily. Patient not taking: Reported on 08/23/2017 05/08/17   Newman Pies, MD    Physical Exam:  Vitals:   08/23/17 1501 08/23/17 1603  BP: (!) 173/107 (!) 137/115  Pulse: (!) 114 (!) 115  Resp: (!) 22 (!) 24  SpO2: 99% 100%     Uncomfortable appearing male in distress moaning and groaning oriented-breath is ketotic-he is orientable but is taking [place] aspirations  No pallor no icterus neck is soft supple with no thyromegaly  Chest is clinically clear no added sound  S1-S2 no murmur rub or gallop tachycardic 100 range  Abdomen is soft slightly tender no rebound however no guarding  Left foot was examined and it shows a large area on the heel/calcaneus which is irregular but as well debrided the floor and the base of the wound are clean and beefy there is no slough or devitalized tissue around the margins of the wound-dorsalis pedis is present on the right side but diminished on the  left  Neurologically he is able to move all 4 limbs but appears significantly uncomfortable  Musculoskeletal no restriction of range of motion   I have personally reviewed following labs and imaging studies  Labs:   Potassium 5.2 bicarb 9 CBG 652 AKI from 1.6-->2.3 anion gap 35  Imaging studies:   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 Pati Gallo of critical care who agrees to see the patient and consult and agrees that if patient needs to we will go to CCM service-I am admitting the patient to the ICU  Decision to obtain old records:   Yes  Review and summation of old records:   I have reviewed his database extensively  Active Problems:   DKA (diabetic ketoacidoses) (HCC)   Assessment/Plan Severe DKA-gap 9, CO2 35-blood gas ABG 7.1 CO2 undetectable He is now 3 L and-I gave him 10 units of regular insulin as the insulin GTT had not come up from the pharmacy and drip has been started in the ED -His wife who is a nurse reports history of noncompliance or poor compliance with last A1c being in the 7 range and she states "he takes insulin as he wants to"  Mild hyperkalemia secondary to acidosis-he does have potassium 5.2 but I do not think he requires Kayexalate as EKG changes not change from prior  Lactic acidosis type a plus type B-patient is on metformin at home which may contribute to the same-I would hesitate to use metformin in the future in this gentleman given his baseline creatinines in the 1.5-1.6 range I expect his lactic acidosis will clear with further treatment  Sepsis probably secondary to foot wound-once again follows Dr. Linton Rump [facility] and gets debrided pretty regularly no recent infections-given that the stray shows some element of osteo -Dr. Dot Lanes consult/ED and will see the patient in consult I have ordered a CT without contrast and in addition we will get ABIs on him in case he might need surgical management of the same -  if it is found that the patient does have osteo-he will need an extended duration of antibiotics-at this time I will continue vancomycin and cefepime -Obtain procalcitonin to see if lactic acidosis is only secondary to DKA -I have not gotten an x-ray on this patient is chest is clear and he is not having any symptoms or signs of pneumonia addedn There are other unrelated non-urgent complaints, but due to the busy schedule and the amount of time I've already spent with him, time does not permit me to address these routine issues at today's visit. I've requested another appointment to review these additional issues. ADDEND 8:50 PM would continue ABX x 24 hours-CT neg-Defer to ortho if further management required--likely could d/c abx in am as PCT non-suggestive infxn.   Chronic diarrhea status post cholecystectomy 13-follow-monitor with outpatient gastroenterologist as above  hyperbilirubinemia unclear etiology-other transaminases are normal but actually seem a little suppressed AST ALT is 14 and 12 I suppose this might be secondary to either sepsis physiology or DKA-he will need LFTs in the morning-if this does not get better he will need abdominal scan Will ask for an INR stat Addend 8:48 PM wife relates has had bilirubin int he past and has been diagnosed with possible NASH as --I have a low suspicion he will need work-up If he does, Dr. Ardis Hughs is his most current GI  Hypertension holding all meds for now he is hypertensive and will need hydralazine 5 mg every 6 blood pressure if >160    Severity of Illness: The appropriate patient status for this patient is INPATIENT. Inpatient status is  judged to be reasonable and necessary in order to provide the required intensity of service to ensure the patient's safety. The patient's presenting symptoms, physical exam findings, and initial radiographic and laboratory data in the context of their chronic comorbidities is felt to place them at high risk for further clinical deterioration. Furthermore, it is not anticipated that the patient will be medically stable for discharge from the hospital within 2 midnights of admission. The following factors support the patient status of inpatient.   " The patient's presenting symptoms include severe sob and dehydration from lactic acidosis. " The worrisome physical exam findings include abd pain and n--and foot infection possible. " The initial radiographic and laboratory data are worrisome because of initial evaulation. " The chronic co-morbidities include diabetes, htn, former smoker.   * I certify that at the point of admission it is my clinical judgment that the patient will require inpatient hospital care spanning beyond 2 midnights from the point of admission due to high intensity of service, high risk for further deterioration and high frequency of surveillance required.*     DVT prophylaxis:lovenox Code Status: full Family Communication: discussd with wife and sister Tora Duck is peds nurse at [employer] is a cardiac RN] Consults called: Ortho CCM requested to see and assume care iffeel prn    Time spent: 110 minutes  CRITICAL CARE Performed by: Nita Sells   Total critical care time: 110 minutes  Critical care time was exclusive of separately billable procedures and treating other patients.  Critical care was necessary to treat or prevent imminent or life-threatening deterioration.  Critical care was time spent personally by me on the following activities: development of treatment plan with patient and/or surrogate as well as nursing, discussions with consultants, evaluation  of patient's response to treatment, examination of patient, obtaining history from patient or surrogate, ordering and performing treatments and interventions, ordering and review  of laboratory studies, ordering and review of radiographic studies, pulse oximetry and re-evaluation of patient's condition.   Verlon Au, MD  [facility] Direct contact: [phone] --Via amion app OR  --[email or web address]; password [email or web address]  7PM-7AM contact night coverage as above  08/23/2017, 4:28 PM

## 2017-08-23 NOTE — Consult Note (Addendum)
Name:App, Chase Clark Male, 52 y.o., [birth year]  Chart reviewed, patient seen and examined. Consulted for severe DKA   Chief Complaint  Patient presents with  . Hyperglycemia; N/V   Nausea, emesis x 2 days  HPI   53 y/o male with h/o  DM (2) requiring insulin, HTN, Dyslipidemia, diabetic neuropathy, h/o calcaneal osteomyelitis, Presented to [facility] ED with emesis, found to be in severe DKA.. Started on IV insulin drip per DKA protocol; recvd vanco+ zosyn, suspected left foot infection. XRAY suspicious for Acute on chronic osteo but CT left foot negative for abscess/ soft tissue gas/ osteo. Admitted to stepdown- tele bed.     has a past medical history of Arthritis, Asthma, Coronary artery disease, Diabetes mellitus without complication ([place]), Diverticulosis, Edema, lower extremity, Fatty liver, GERD (gastroesophageal reflux disease), Gilbert's disease, Glaucoma, Heart disease (2011), Hyperlipidemia, Hypertension, Neuropathy, Seasonal allergies, Shoulder pain, and Ulcer of foot ([place]) (08.06.2014).   has a past surgical history that includes Cholecystectomy (N/A); Vasectomy; Eye surgery (Right, sept [phone]); Coronary angioplasty with stent (2007); Cardiac catheterization; Cataract extraction w/ intraocular lens implant; Insertion express tube shunt (Right, 09/06/2015); Epiblepheron repair with tear duct probing; ABDOMINAL AORTOGRAM (N/A, 05/20/2016); Cataract extraction w/PHACO (Left, 02/26/2017); and Anterior cervical decomp/discectomy fusion (N/A, 05/07/2017).  Family History: DM  Social History: non smoker, no alcohol use   Home  & inpt Medications: revwd    ROS  Limited by critical condition No fever, chest pain, dyspnea, cough, abdominal pain,  Left foot wound- goes to wound clinic in [facility]   Physical Exam: Blood pressure 131/78, pulse (!) 116, resp. rate 17, height 5\' 11"  (1.803 m), weight 215 lb (97.5 kg), SpO2 100 %.  General appearance  Ill appearing male, NAD    HEENT No pallor/icterus No nasal congestion or drainage No tongue swelling No swelling in the neck  Cardiovascular S1 and S2 regular no Murmur/gallop  Respiratory Chest expansion equal Breath sounds equal and clear No rhonchi or rales   Abdomen   Soft,Nontender, nondistended No organomegaly,No mass Bowel sounds+   Extremities No swelling,No tenderness No clubbing Capillary refill adequate Peripheral pulses- feet - faint Left foot - large heel wound with granulation, slough, malodor; no purulent drainage. No crep.  Neurological Alert, awake, oriented Pupils equal and reacting No facial asymmetry Tone normal Grossly intact   Labs CBC Latest Ref Rng & Units 08/23/2017 05/07/2017 05/04/2017  WBC 4.0 - 10.5 K/uL 11.8(H) 10.1 14.1(H)  Hemoglobin 13.0 - 17.0 g/dL 12.0(L) 10.3(L) 11.2(L)  Hematocrit 39.0 - 52.0 % 36.0(L) 31.9(L) 33.3(L)  Platelets 150 - 400 K/uL 400 212 259.0   BMP Latest Ref Rng & Units 08/23/2017 05/07/2017 05/04/2017  Glucose 65 - 99 mg/dL 652(HH) 162(H) 380(H)  BUN 6 - 20 mg/dL 44(H) 33(H) 30(H)  Creatinine 0.61 - 1.24 mg/dL 2.34(H) 1.62(H) 1.69(H)  Sodium 135 - 145 mmol/L 133(L) 136 135  Potassium 3.5 - 5.1 mmol/L 5.2(H) 4.5 4.7  Chloride 101 - 111 mmol/L 89(L) 105 99  CO2 22 - 32 mmol/L 9(L) 20(L) 22  Calcium 8.9 - 10.3 mg/dL 10.1 8.9 9.5    Xray left foot 08-23-17   Findings most consistent with acute on chronic calcaneal osteomyelitis subjacent to a skin wound on the heel.Healed calcaneal fracture.   CT scan left foot 08/23/17  Skin ulceration on the heel without CT evidence of underlying osteomyelitis. Negative for abscess.Healed fracture of dorsal calcaneus. Atherosclerosis.  Ekg- 08-23-17 sinus tachycardia    Assessment and Plan:     52 y/o  Y/o      Male     With    H/o DM(2) on metformin and insulin, hyperlipidemia, HTN, diabetic neuropathy with left ankle soft issue infection, no evidence of abscess, gangrene or osteomyelitis by  imaging.   In severe DKA with mild hyperkalemia, severe anion gap metabolic acidosis.  Precipitating factor>> ? Compliance  ?left foot infection--clinically doubt pancreatitis  AKI likely secondary to hypovolemia     Cardiology  HD stable Reintroduce antihypertensive when volume status adequate  Pulmonary  No acute process now Monitor oxygenation   GI /nutrition  Npo till acidosis resolves Prn zofran Check lipase Stress ulcer prophlx- famotidine  Renal, fluids, electrolytes, Endocrine Fluids per DKA protocol Monitor and correct-K,Phos, Mag IV insulin drip Hold lantus and metformin  Hematology dvt prophylaxis      Infectious diseases On vanco+ zosyn Pharm monitoring F/u c/s  wound care consult   Counseling Patient/Family:  -pt & wife updated  can remain as stepdown pt and PCCM to follow;  pls let us know if clinical condition changes.    Code status: full   Thank you for letting me participate in the care of your patient.  ^^^^^^^^^^ I  Have personally spent  60    Minutes  In the care of this Patient providing Critical care Services; Time includes review of chart, labs, imaging, coordinating care with other physicians and healthcare team members. Also includes time for frequent reevaluation and additional treatment implementation due to change in clinical condiiton of patient. Excludes time spent for Procedure and Teaching.   ^^^^^^^^^^  Note subject to typographical and grammatical errors;   Any formal questions or concerns about the content, text, or information contained within the body of this dictation should be directly addressed to the physician  for  clarification.   Evans Lance, MD [facility] Pager: [phone]

## 2017-08-23 NOTE — ED Notes (Signed)
Date and time results received: 08/23/17 1445 (use smartphrase ".now" to insert current time)  Test: Glucose Critical Value: 652   Name of Provider Notified: MacKuen  Orders Received? Or Actions Taken?: awaiting orders

## 2017-08-23 NOTE — Progress Notes (Signed)
A consult was received from an ED physician for Zosyn per pharmacy dosing.  The patient's profile has been reviewed for ht/wt/allergies/indication/available labs.   A one time order has been placed for 3.375g Zosyn.  Further antibiotics/pharmacy consults should be ordered by admitting physician if indicated.                       Thank you, Kara Mead 08/23/2017  3:51 PM

## 2017-08-23 NOTE — ED Provider Notes (Signed)
[facility] DEPT Provider Note   CSN: [id number] Arrival date & time: 08/23/17  1308     History   Chief Complaint Chief Complaint  Patient presents with  . Hyperglycemia; N/V    HPI Chase Clark is a 52 y.o. male.  HPI   52 year old male past medical history significant for insulin-dependent diabetes and nonhealing left heel ulcer presenting today with vomiting and uncontrolled sugars.  Patient reports that he for the last 2 to 3 days he has not taken any of his insulin because he is been too sick, and vomiting.  Patient's wife noticed that his left heel ulcer started smelling differently as well.  Past Medical History:  Diagnosis Date  . Arthritis   . Asthma    as child  . Coronary artery disease    DES DIAG1 11/20/09 [facility])  . Diabetes mellitus without complication ([place])   . Diverticulosis   . Edema, lower extremity   . Fatty liver   . GERD (gastroesophageal reflux disease)   . Gilbert's disease   . Glaucoma   . Heart disease 2011   Patient has stent for 80% blockage  . Hyperlipidemia   . Hypertension   . Neuropathy   . Seasonal allergies   . Shoulder pain   . Ulcer of foot ([place]) 08.06.2014   DIABETIC ULCERATIONS ASSOCIATED WITH IRRITATION LATERAL ANKLE LEFT GREATER THAN RIGHT WITH MILD CELLULITIS    Patient Active Problem List   Diagnosis Date Noted  . Cervical spondylosis with myelopathy and radiculopathy 05/07/2017  . ARF (acute renal failure) ([place]) 04/30/2017  . Upper respiratory infection 04/29/2017  . Preop examination 04/29/2017  . Fatty liver 04/04/2017  . Routine general medical examination at a health care facility 01/13/2017  . DDD (degenerative disc disease), cervical 01/13/2017  . PAD (peripheral artery disease) ([place]) 08/07/2016  . Ulcer of ankle ([place]) 08/07/2016  . Chronic diarrhea 05/17/2016  . Hyponatremia 05/17/2016  . Anemia 01/12/2014  . Disorder of ligament of ankle 07/31/2013  . Edema 06/28/2013  .  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 Obesity (BMI 30-39.9) 06/12/2013  . Pain in joint, shoulder region 06/12/2013  . Statin intolerance 06/10/2013  . Diabetic ulcer of foot with muscle involvement without evidence of necrosis ([place])   . DM (diabetes mellitus), type 2, uncontrolled w/ophthalmic complication ([place]) 86/76/7209  . Hyperlipidemia associated with type 2 diabetes mellitus ([place]) 07/17/2006  . GILBERT'S SYNDROME 07/17/2006  . Essential hypertension 07/17/2006  . Coronary atherosclerosis 07/17/2006  . OSTEOARTHRITIS 07/17/2006  . COUGH 07/17/2006    Past Surgical History:  Procedure Laterality Date  . ABDOMINAL AORTOGRAM N/A 05/20/2016   Procedure: Abdominal Aortogram possible intervention;  Surgeon: Katha Cabal, MD;  Location: [facility];  Service: Cardiovascular;  Laterality: N/A;  . ANTERIOR CERVICAL DECOMP/DISCECTOMY FUSION N/A 05/07/2017   Procedure: ANTERIOR CERVICAL DECOMPRESSION/DISCECTOMY Luevenia Maxin PROSTHESIS,PLATE/SCREWS CERVICAL FIVE - CERVICAL SIX;  Surgeon: Newman Pies, MD;  Location: [facility];  Service: Neurosurgery;  Laterality: N/A;  . CARDIAC CATHETERIZATION    . CATARACT EXTRACTION W/ INTRAOCULAR LENS IMPLANT    . CATARACT EXTRACTION W/PHACO Left 02/26/2017   Procedure: CATARACT EXTRACTION PHACO AND INTRAOCULAR LENS PLACEMENT (IOC);  Surgeon: Eulogio Bear, MD;  Location: [facility] OR[facility];  Service: Ophthalmology;  Laterality: Left;  Lot # [id number] H [place]: 00:25.3 AP%: 6.2 CDE: 1.59   . CHOLECYSTECTOMY N/A   . CORONARY ANGIOPLASTY WITH STENT PLACEMENT  2007   1st diagonal  . EPIBLEPHERON REPAIR WITH  TEAR DUCT PROBING    . EYE SURGERY Right sept 29n2015   cataract extraction  . INSERTION EXPRESS TUBE SHUNT Right 09/06/2015   Procedure: INSERTION AHMED TUBE SHUNT with tutoplast allograft;  Surgeon: Eulogio Bear, MD;  Location: [facility] OR[facility];  Service: Ophthalmology;  Laterality: Right;  Marland Kitchen VASECTOMY          Home Medications     Prior to Admission medications   Medication Sig Start Date End Date Taking? Authorizing Provider  amLODipine (NORVASC) 10 MG tablet Take 1 tablet (10 mg total) by mouth daily. 06/30/17   [name], MD  aspirin EC 81 MG tablet Take 81 mg by mouth daily.    [provider]  brimonidine-timolol (COMBIGAN) 0.2-0.5 % ophthalmic solution Place 1 drop into the right eye every 12 (twelve) hours.     [provider]  docusate sodium (COLACE) 100 MG capsule Take 1 capsule (100 mg total) by mouth 2 (two) times daily. 05/08/17   Newman Pies, MD  DUREZOL 0.05 % EMUL APPLY 1 DROP(S) IN LEFT EYE 2 TIMES A DAY 03/27/17   [provider]  esomeprazole (NEXIUM) 40 MG capsule Take 1 capsule (40 mg total) by mouth daily. Patient taking differently: Take 40 mg by mouth daily.  05/21/16   Loletha Grayer, MD  furosemide (LASIX) 40 MG tablet Take 40 mg by mouth as needed. 04/29/17   [provider]  gabapentin (NEURONTIN) 300 MG capsule Take 2 capsules (600 mg total) by mouth 2 (two) times daily. 05/25/17   [name], MD  insulin aspart (NOVOLOG FLEXPEN) 100 UNIT/ML FlexPen Inject 10 units with breakfast, 10-15 units with lunch, and 15-20 units with supper Patient taking differently: Inject 15-20 Units into the skin 3 (three) times daily with meals. Per sliding scale 02/23/17   [name], MD  insulin glargine (LANTUS) 100 UNIT/ML injection Inject 0.45 mLs (45 Units total) into the skin daily. Patient taking differently: Inject 50 Units into the skin daily. Takes in the morning 10/10/16   [name], MD  Insulin Pen Needle (PEN NEEDLES) 32G X 4 MM MISC Inject 90 each 3 (three) times daily into the skin. 02/23/17   [name], MD  latanoprost (XALATAN) 0.005 % ophthalmic solution Place 1 drop into both eyes at bedtime. 03/03/17   [provider]  losartan-hydrochlorothiazide (HYZAAR) 100-25 MG tablet Take 1 tablet by mouth daily. 07/15/15    [provider]  metFORMIN (GLUCOPHAGE) 500 MG tablet Take 2 tablets (1,000 mg total) by mouth 2 (two) times daily with a meal. 06/09/17   [name], MD  metoprolol tartrate (LOPRESSOR) 100 MG tablet Take 1 tablet (100 mg total) by mouth 2 (two) times daily. 05/25/17   [name], MD  oxyCODONE 10 MG TABS Take 1 tablet (10 mg total) by mouth every 4 (four) hours as needed for severe pain ((score 7 to 10)). 05/08/17   Newman Pies, MD  Probiotic Product (PROBIOTIC PO) Take 1 tablet by mouth daily.    [provider]  rosuvastatin (CRESTOR) 20 MG tablet Take 1 tablet (20 mg total) daily by mouth. 02/23/17   [name], MD    Family History Family History  Problem Relation Age of Onset  . Hyperlipidemia Mother   . Diabetes Mother   . Liver disease Mother        NASH  . Hyperlipidemia Father   . Diabetes Father   . Heart disease Father   .  Hypertension Father     Social History Social History   Tobacco Use  . Smoking status: Former Smoker    Types: Cigarettes    Last attempt to quit: 03/14/2008    Years since quitting: 9.4  . Smokeless tobacco: Never Used  Substance Use Topics  . Alcohol use: Yes    Comment: OCCAS  . Drug use: No     Allergies   Atorvastatin   Review of Systems Review of Systems  Constitutional: Positive for fatigue. Negative for activity change.  Respiratory: Negative for shortness of breath.   Cardiovascular: Negative for chest pain.  Gastrointestinal: Positive for vomiting. Negative for abdominal pain.  Neurological: Positive for light-headedness.     Physical Exam Updated Vital Signs SpO2 100%   Physical Exam  Constitutional: He is oriented to person, place, and time. He appears well-nourished.  Patient well-appearing no clue small breathing in the room with tachycardia.  HENT:  Head: Normocephalic.  Eyes: Conjunctivae are normal.  Cardiovascular:  Tachycardia.  Pulmonary/Chest: Breath sounds  normal.  Patient [place] breathing.  Abdominal: Soft. He exhibits no distension. There is no tenderness.  Musculoskeletal: Normal range of motion. He exhibits no edema.  Left heel ulcer is open, malodorous, pus.  Neurological: He is oriented to person, place, and time.  Skin: Skin is warm and dry. He is not diaphoretic.  Psychiatric: He has a normal mood and affect. His behavior is normal.  Nursing note and vitals reviewed.    ED Treatments / Results  Labs (all labs ordered are listed, but only abnormal results are displayed) Labs Reviewed  COMPREHENSIVE METABOLIC PANEL  CBC WITH DIFFERENTIAL/PLATELET  I-STAT VENOUS BLOOD GAS, ED  I-STAT CG4 LACTIC ACID, ED  I-STAT TROPONIN, ED    EKG None  Radiology No results found.  Procedures Procedures (including critical care time)  CRITICAL CARE Performed by: Gardiner Sleeper Total critical care time: 45 minutes Critical care time was exclusive of separately billable procedures and treating other patients. Critical care was necessary to treat or prevent imminent or life-threatening deterioration. Critical care was time spent personally by me on the following activities: development of treatment plan with patient and/or surrogate as well as nursing, discussions with consultants, evaluation of patient's response to treatment, examination of patient, obtaining history from patient or surrogate, ordering and performing treatments and interventions, ordering and review of laboratory studies, ordering and review of radiographic studies, pulse oximetry and re-evaluation of patient's condition.   Medications Ordered in ED Medications  sodium chloride 0.9 % bolus 1,000 mL (has no administration in time range)  ondansetron (ZOFRAN) injection 4 mg (has no administration in time range)     Initial Impression / Assessment and Plan / ED Course  I have reviewed the triage vital signs and the nursing notes.  Pertinent labs & imaging  results that were available during my care of the patient were reviewed by me and considered in my medical decision making (see chart for details).     52 year old male past medical history significant for insulin-dependent diabetes and nonhealing left heel ulcer presenting today with vomiting and uncontrolled sugars.  Patient reports that he for the last 2 to 3 days he has not taken any of his insulin because he is been too sick, and vomiting.  Patient's wife noticed that his left heel ulcer started smelling differently as well.  Suspect DKA given [place] breathing.  Likely source is the worsening infection in the left heel.  Will get x-ray to evaluate for  osteo-.  Will start IV antibiotics.  3:35 PM Insulin drip started.  Patient's pH 7.14.  Discussed with hospitalist.  Discussed with orthopedics, Dr.Graves, who will be seen patient as DKA resolves for potential treatment  Will admit to step down.  Final Clinical Impressions(s) / ED Diagnoses   Final diagnoses:  None    ED Discharge Orders    None       Mackuen, Fredia Sorrow, MD 08/23/17 1557

## 2017-08-23 NOTE — ED Notes (Signed)
Date and time results received: 08/23/17 1410 (use smartphrase ".now" to insert current time)  Test: Venous Ph Critical Value: 7.145  Name of Provider Notified: Dr. Thomasene Lot  Orders Received? Or Actions Taken?:

## 2017-08-23 NOTE — ED Triage Notes (Signed)
Per EMS, pt is coming from home with complaints of hyperglycemia. Pt reports not being able to eat due to n/v and has not had any insulin. EMS reports that pt is now vomiting coffee ground emesis. Pt has had 8 mg zofran and 500 mL NS prior to arrival. Pt has hx of hypertension, diabetes, and hyperlipidemia.

## 2017-08-23 NOTE — Progress Notes (Signed)
A consult was received from an ED physician for vanc per pharmacy dosing.  The patient's profile has been reviewed for ht/wt/allergies/indication/available labs.   A one time order has been placed for vanc 1g.  Further antibiotics/pharmacy consults should be ordered by admitting physician if indicated.                       Thank you, Kara Mead 08/23/2017  1:50 PM

## 2017-08-23 NOTE — ED Notes (Signed)
ED TO INPATIENT HANDOFF REPORT  Name/Age/Gender Chase Clark 52 y.o. male  Code Status    Code Status Orders  (From admission, onward)        Start     Ordered   08/23/17 1623  Full code  Continuous     08/23/17 1628    Code Status History    Date Active Date Inactive Code Status Order ID Comments User Context   05/07/2017 2050 05/08/2017 1423 Full Code [id number]  Newman Pies, MD Inpatient   04/30/2017 0127 04/30/2017 1730 Full Code [id number]  Amelia Jo, MD Inpatient   05/17/2016 1944 05/21/2016 1704 Full Code [id number]  Idelle Crouch, MD Inpatient      Home/SNF/Other Home  Chief Complaint hyperglycemia; nausea and vomiting  Level of Care/Admitting Diagnosis ED Disposition    ED Disposition Condition [place]: [facility] [100102]  Level of Care: ICU [6]  Diagnosis: DKA (diabetic ketoacidoses) [facility]) [801655]  Admitting Physician: Nita Sells [phone]  Attending Physician: Nita Sells [phone]  Estimated length of stay: 3 - 4 days  Certification:: I certify this patient will need inpatient services for at least 2 midnights  PT Class (Do Not Modify): Inpatient [101]  PT Acc Code (Do Not Modify): Private [1]       Medical History Past Medical History:  Diagnosis Date  . Arthritis   . Asthma    as child  . Coronary artery disease    DES DIAG1 11/20/09 [facility])  . Diabetes mellitus without complication ([place])   . Diverticulosis   . Edema, lower extremity   . Fatty liver   . GERD (gastroesophageal reflux disease)   . Gilbert's disease   . Glaucoma   . Heart disease 2011   Patient has stent for 80% blockage  . Hyperlipidemia   . Hypertension   . Neuropathy   . Seasonal allergies   . Shoulder pain   . Ulcer of foot ([place]) 08.06.2014   DIABETIC ULCERATIONS ASSOCIATED WITH IRRITATION LATERAL ANKLE LEFT GREATER THAN RIGHT WITH MILD CELLULITIS    Allergies Allergies  Allergen Reactions  .  Atorvastatin Other (See Comments)    Myalgias     IV Location/Drains/Wounds Patient Lines/Drains/Airways Status   Active Line/Drains/Airways    Name:   Placement date:   Placement time:   Site:   Days:   Peripheral IV 08/23/17 Right Hand   08/23/17    -    Hand   less than 1   Peripheral IV 08/23/17 Left Forearm   08/23/17    1359    Forearm   less than 1   Wound / Incision (Open or Dehisced) 05/17/16 Diabetic ulcer Heel Left deep tissue with eschar   05/17/16    2000    Heel   463          Labs/Imaging Results for orders placed or performed during the hospital encounter of 08/23/17 (from the past 48 hour(s))  Comprehensive metabolic panel     Status: Abnormal   Collection Time: 08/23/17  2:07 PM  Result Value Ref Range   Sodium 133 (L) 135 - 145 mmol/L   Potassium 5.2 (H) 3.5 - 5.1 mmol/L   Chloride 89 (L) 101 - 111 mmol/L   CO2 9 (L) 22 - 32 mmol/L   Glucose, Bld 652 (HH) 65 - 99 mg/dL    Comment: CRITICAL RESULT CALLED TO, READ BACK BY AND VERIFIED WITH: BRILL,M AT 1445 ON 08/23/2017 BY  MOSLEY,J    BUN 44 (H) 6 - 20 mg/dL   Creatinine, Ser 2.34 (H) 0.61 - 1.24 mg/dL   Calcium 10.1 8.9 - 10.3 mg/dL   Total Protein 8.7 (H) 6.5 - 8.1 g/dL   Albumin 3.1 (L) 3.5 - 5.0 g/dL   AST 14 (L) 15 - 41 U/L   ALT 12 (L) 17 - 63 U/L   Alkaline Phosphatase 118 38 - 126 U/L   Total Bilirubin 2.3 (H) 0.3 - 1.2 mg/dL   GFR calc non Af Amer 30 (L) >60 mL/min   GFR calc Af Amer 35 (L) >60 mL/min    Comment: (NOTE) The eGFR has been calculated using the CKD EPI equation. This calculation has not been validated in all clinical situations. eGFR's persistently <60 mL/min signify possible Chronic Kidney Disease.    Anion gap 35 (H) 5 - 15    Comment: Performed at [facility], [place] [address], [place], [place] [address]  CBC with Differential/Platelet     Status: Abnormal   Collection Time: 08/23/17  2:07 PM  Result Value Ref Range   WBC 11.8 (H) 4.0 - 10.5 K/uL   RBC  4.10 (L) 4.22 - 5.81 MIL/uL   Hemoglobin 12.0 (L) 13.0 - 17.0 g/dL   HCT 36.0 (L) 39.0 - 52.0 %   MCV 87.8 78.0 - 100.0 fL   MCH 29.3 26.0 - 34.0 pg   MCHC 33.3 30.0 - 36.0 g/dL   RDW 13.9 11.5 - 15.5 %   Platelets 400 150 - 400 K/uL   Neutrophils Relative % 86 %   Lymphocytes Relative 10 %   Monocytes Relative 4 %   Eosinophils Relative 0 %   Basophils Relative 0 %   Neutro Abs 10.1 (H) 1.7 - 7.7 K/uL   Lymphs Abs 1.2 0.7 - 4.0 K/uL   Monocytes Absolute 0.5 0.1 - 1.0 K/uL   Eosinophils Absolute 0.0 0.0 - 0.7 K/uL   Basophils Absolute 0.0 0.0 - 0.1 K/uL    Comment: Performed at [facility], [place] [address] [place], [place] [address]  I-stat troponin, ED     Status: None   Collection Time: 08/23/17  2:07 PM  Result Value Ref Range   Troponin i, poc 0.01 0.00 - 0.08 ng/mL   Comment 3            Comment: Due to the release kinetics of cTnI, a negative result within the first hours of the onset of symptoms does not rule out myocardial infarction with certainty. If myocardial infarction is still suspected, repeat the test at appropriate intervals.   I-Stat CG4 Lactic Acid, ED     Status: Abnormal   Collection Time: 08/23/17  2:09 PM  Result Value Ref Range   Lactic Acid, Venous 3.17 (HH) 0.5 - 1.9 mmol/L   Comment NOTIFIED PHYSICIAN   Blood gas, venous     Status: Abnormal (Preliminary result)   Collection Time: 08/23/17  2:10 PM  Result Value Ref Range   pH, Ven PENDING 7.250 - 7.430   pCO2, Ven 23.8 (L) 44.0 - 60.0 mmHg   pO2, Ven BELOW REPORTABLE RANGE 32.0 - 45.0 mmHg    Comment: RBV DR. MACKUEN AT 1425 ON 08/23/2017 BY T.BURGESS,RRT,RCP    Bicarbonate 7.9 (L) 20.0 - 28.0 mmol/L   Acid-base deficit 20.0 (H) 0.0 - 2.0 mmol/L   O2 Saturation 37.7 %   Patient temperature 98.6    Collection site VEIN    Drawn  by COLLECTED BY NURSE    Sample type VENOUS     Comment: Performed at [facility] [address] [place], [place]  [address]  CBG monitoring, ED     Status: Abnormal   Collection Time: 08/23/17  2:18 PM  Result Value Ref Range   Glucose-Capillary 571 (HH) 65 - 99 mg/dL   Comment 1 Notify RN    Comment 2 Document in Chart   Blood gas, arterial ([facility] & [facility] ONLY)     Status: Abnormal   Collection Time: 08/23/17  3:33 PM  Result Value Ref Range   FIO2 21.00    Delivery systems ROOM AIR    pH, Arterial 7.175 (LL) 7.350 - 7.450    Comment: RBV DR.MACKUEN AT 1549 BY T.BURGESS,RRT,RCP ON 08/23/2017    pCO2 arterial BELOW REPORTABLE RANGE 32.0 - 48.0 mmHg    Comment: RBV DR.MACKUEN AT 1549 BY T.BURGESS,RRT,RCP ON 08/23/2017    pO2, Arterial 133 (H) 83.0 - 108.0 mmHg   O2 Saturation 97.1 %   Patient temperature 98.6    Collection site BRACHIAL ARTERY    Drawn by [id number]    Sample type ARTERIAL     Comment: Performed at [facility], [place] [address], [place], [place] [address]  CBG monitoring, ED     Status: Abnormal   Collection Time: 08/23/17  4:32 PM  Result Value Ref Range   Glucose-Capillary 528 (HH) 65 - 99 mg/dL   Dg Foot 2 Views Left  Result Date: 08/23/2017 CLINICAL DATA:  Diabetic patient with a nonhealing wound on the left heel. The wound is chronic. EXAM: LEFT FOOT - 2 VIEW COMPARISON:  Plain films of the left foot 07/10/2016. FINDINGS: There is a large skin wound on the plantar surface of the heel. Previously seen fracture of the dorsal calcaneus has healed. There is sclerosis in the calcaneus and irregularity of cortical bone deep to the patient's skin ulceration. No radiopaque foreign body. Atherosclerosis noted. 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 Healed calcaneal fracture. Electronically Signed   By: Inge Rise M.D.   On: 08/23/2017 14:35    Pending Labs Unresulted Labs (From admission, onward)   Start     Ordered   08/30/17 0500  Creatinine, serum  (enoxaparin (LOVENOX)    CrCl < 30  ml/min)  Weekly,   R    Comments:  while on enoxaparin therapy.    08/23/17 1628   08/24/17 0500  Procalcitonin  Daily,   R     08/23/17 1639   08/24/17 0500  Creatinine, serum  Daily,   R     08/23/17 1650   08/23/17 1711  Magnesium  5A & 5P,   R     08/23/17 1710   08/23/17 1711  Phosphorus  5A & 5P,   R     08/23/17 1710   08/23/17 1649  Protime-INR  STAT,   R     08/23/17 1648   08/23/17 1640  Procalcitonin - Baseline  STAT,   STAT     08/23/17 1639   08/23/17 1624  Magnesium  Once,   R     08/23/17 1628   08/23/17 1624  Phosphorus  Once,   R     08/23/17 1628   08/23/17 1624  Culture, blood (routine x 2)  BLOOD CULTURE X 2,   R     08/23/17 1628   08/23/17  1624  Urine rapid drug screen (hosp performed)not at [facility]  Once,   R     08/23/17 1628   08/23/17 1583  Basic metabolic panel  STAT Now then every 4 hours ,   STAT     08/23/17 1628   08/23/17 1623  CBC  (enoxaparin (LOVENOX)    CrCl < 30 ml/min)  Once,   R    Comments:  Baseline for enoxaparin therapy IF NOT ALREADY DRAWN.  Notify MD if PLT < 100 K.    08/23/17 1628      Vitals/Pain Today's Vitals   08/23/17 1517 08/23/17 1603 08/23/17 1715 08/23/17 1720  BP:  (!) 137/115 (!) 82/52 116/81  Pulse:  (!) 115 (!) 114   Resp:  (!) 24 20   SpO2:  100% 100%   Weight: 215 lb (97.5 kg)     Height: _0  (1.803 m)     PainSc:        Isolation Precautions No active isolations  Medications Medications  dextrose 5 %-0.45 % sodium chloride infusion (has no administration in time range)  promethazine (PHENERGAN) injection 12.5 mg (12.5 mg Intravenous Given 08/23/17 1620)  dextrose 5 %-0.45 % sodium chloride infusion (has no administration in time range)  insulin regular (NOVOLIN R,HUMULIN R) 100 Units in sodium chloride 0.9 % 100 mL (1 Units/mL) infusion (has no administration in time range)  enoxaparin (LOVENOX) injection 30 mg (has no administration in time range)  0.9 %  sodium chloride infusion (has no  administration in time range)  hydrALAZINE (APRESOLINE) injection 5 mg (has no administration in time range)  vancomycin (VANCOCIN) IVPB 1000 mg/200 mL premix (has no administration in time range)  vancomycin (VANCOCIN) IVPB 1000 mg/200 mL premix (has no administration in time range)  piperacillin-tazobactam (ZOSYN) IVPB 3.375 g (has no administration in time range)  sodium chloride 0.9 % bolus 1,000 mL (0 mLs Intravenous Stopped 08/23/17 1442)  ondansetron (ZOFRAN) injection 4 mg (4 mg Intravenous Given 08/23/17 1410)  vancomycin (VANCOCIN) IVPB 1000 mg/200 mL premix (0 mg Intravenous Stopped 08/23/17 1531)  sodium chloride 0.9 % bolus 1,000 mL (0 mLs Intravenous Stopped 08/23/17 1629)  metoCLOPramide (REGLAN) injection 10 mg (10 mg Intravenous Given 08/23/17 1422)  insulin aspart (novoLOG) injection 10 Units (10 Units Intravenous Bolus 08/23/17 1536)  0.9 %  sodium chloride infusion ( Intravenous New Bag/Given 08/23/17 1533)  piperacillin-tazobactam (ZOSYN) IVPB 3.375 g (3.375 g Intravenous New Bag/Given 08/23/17 1643)    Mobility walks with device

## 2017-08-23 NOTE — ED Notes (Signed)
Bed: WA22 Expected date:  Expected time:  Means of arrival:  Comments: 

## 2017-08-23 NOTE — ED Notes (Signed)
Dr. Thomasene Lot made aware of critical value on I-stat lactic

## 2017-08-23 NOTE — Progress Notes (Signed)
Panic result of 7.145 pH from a venous blood gas, unable to result in computer. Result given to Dr. Thomasene Lot  At 1425 on 08/23/2017 by Daryel November, RCP.

## 2017-08-24 ENCOUNTER — Inpatient Hospital Stay (HOSPITAL_COMMUNITY): Payer: BLUE CROSS/BLUE SHIELD

## 2017-08-24 ENCOUNTER — Encounter (HOSPITAL_COMMUNITY): Payer: Self-pay

## 2017-08-24 ENCOUNTER — Other Ambulatory Visit: Payer: Self-pay

## 2017-08-24 DIAGNOSIS — L8990 Pressure ulcer of unspecified site, unspecified stage: Secondary | ICD-10-CM

## 2017-08-24 DIAGNOSIS — L89629 Pressure ulcer of left heel, unspecified stage: Secondary | ICD-10-CM

## 2017-08-24 LAB — BASIC METABOLIC PANEL
Anion gap: 17 — ABNORMAL HIGH (ref 5–15)
Anion gap: 16 — ABNORMAL HIGH (ref 5–15)
Anion gap: 17 — ABNORMAL HIGH (ref 5–15)
Anion gap: 14 (ref 5–15)
Anion gap: 14 (ref 5–15)
Anion gap: 13 (ref 5–15)
BUN: 34 mg/dL — ABNORMAL HIGH (ref 6–20)
BUN: 32 mg/dL — ABNORMAL HIGH (ref 6–20)
BUN: 29 mg/dL — ABNORMAL HIGH (ref 6–20)
BUN: 26 mg/dL — ABNORMAL HIGH (ref 6–20)
BUN: 25 mg/dL — ABNORMAL HIGH (ref 6–20)
BUN: 22 mg/dL — ABNORMAL HIGH (ref 6–20)
CO2: 16 mmol/L — ABNORMAL LOW (ref 22–32)
CO2: 16 mmol/L — ABNORMAL LOW (ref 22–32)
CO2: 19 mmol/L — ABNORMAL LOW (ref 22–32)
CO2: 17 mmol/L — ABNORMAL LOW (ref 22–32)
CO2: 17 mmol/L — ABNORMAL LOW (ref 22–32)
CO2: 15 mmol/L — ABNORMAL LOW (ref 22–32)
Calcium: 9.1 mg/dL (ref 8.9–10.3)
Calcium: 8.9 mg/dL (ref 8.9–10.3)
Calcium: 9.3 mg/dL (ref 8.9–10.3)
Calcium: 9.1 mg/dL (ref 8.9–10.3)
Calcium: 8.9 mg/dL (ref 8.9–10.3)
Calcium: 9.2 mg/dL (ref 8.9–10.3)
Chloride: 107 mmol/L (ref 101–111)
Chloride: 108 mmol/L (ref 101–111)
Chloride: 107 mmol/L (ref 101–111)
Chloride: 111 mmol/L (ref 101–111)
Chloride: 110 mmol/L (ref 101–111)
Chloride: 113 mmol/L — ABNORMAL HIGH (ref 101–111)
Creatinine, Ser: 1.58 mg/dL — ABNORMAL HIGH (ref 0.61–1.24)
Creatinine, Ser: 1.39 mg/dL — ABNORMAL HIGH (ref 0.61–1.24)
Creatinine, Ser: 1.49 mg/dL — ABNORMAL HIGH (ref 0.61–1.24)
Creatinine, Ser: 1.26 mg/dL — ABNORMAL HIGH (ref 0.61–1.24)
Creatinine, Ser: 1.29 mg/dL — ABNORMAL HIGH (ref 0.61–1.24)
Creatinine, Ser: 1.3 mg/dL — ABNORMAL HIGH (ref 0.61–1.24)
GFR calc Af Amer: 56 mL/min — ABNORMAL LOW (ref >60)
GFR calc Af Amer: >60 mL/min (ref >60)
GFR calc Af Amer: 60 mL/min — ABNORMAL LOW (ref >60)
GFR calc Af Amer: >60 mL/min (ref >60)
GFR calc Af Amer: >60 mL/min (ref >60)
GFR calc Af Amer: >60 mL/min (ref >60)
GFR calc non Af Amer: 48 mL/min — ABNORMAL LOW (ref >60)
GFR calc non Af Amer: 56 mL/min — ABNORMAL LOW (ref >60)
GFR calc non Af Amer: 52 mL/min — ABNORMAL LOW (ref >60)
GFR calc non Af Amer: >60 mL/min (ref >60)
GFR calc non Af Amer: >60 mL/min (ref >60)
GFR calc non Af Amer: >60 mL/min (ref >60)
Glucose, Bld: 170 mg/dL — ABNORMAL HIGH (ref 65–99)
Glucose, Bld: 136 mg/dL — ABNORMAL HIGH (ref 65–99)
Glucose, Bld: 198 mg/dL — ABNORMAL HIGH (ref 65–99)
Glucose, Bld: 172 mg/dL — ABNORMAL HIGH (ref 65–99)
Glucose, Bld: 167 mg/dL — ABNORMAL HIGH (ref 65–99)
Glucose, Bld: 178 mg/dL — ABNORMAL HIGH (ref 65–99)
Potassium: 3.7 mmol/L (ref 3.5–5.1)
Potassium: 3.5 mmol/L (ref 3.5–5.1)
Potassium: 3.7 mmol/L (ref 3.5–5.1)
Potassium: 3.3 mmol/L — ABNORMAL LOW (ref 3.5–5.1)
Potassium: 3.7 mmol/L (ref 3.5–5.1)
Potassium: 3.8 mmol/L (ref 3.5–5.1)
Sodium: 140 mmol/L (ref 135–145)
Sodium: 140 mmol/L (ref 135–145)
Sodium: 143 mmol/L (ref 135–145)
Sodium: 142 mmol/L (ref 135–145)
Sodium: 141 mmol/L (ref 135–145)
Sodium: 141 mmol/L (ref 135–145)

## 2017-08-24 LAB — CBC WITH DIFFERENTIAL/PLATELET
Basophils Absolute: 0 10*3/uL (ref 0.0–0.1)
Basophils Relative: 0 %
Eosinophils Absolute: 0 10*3/uL (ref 0.0–0.7)
Eosinophils Relative: 0 %
HCT: 36 % — ABNORMAL LOW (ref 39.0–52.0)
Hemoglobin: 12 g/dL — ABNORMAL LOW (ref 13.0–17.0)
Lymphocytes Relative: 10 %
Lymphs Abs: 1.2 10*3/uL (ref 0.7–4.0)
MCH: 29.3 pg (ref 26.0–34.0)
MCHC: 33.3 g/dL (ref 30.0–36.0)
MCV: 87.8 fL (ref 78.0–100.0)
Monocytes Absolute: 0.5 10*3/uL (ref 0.1–1.0)
Monocytes Relative: 4 %
Neutro Abs: 10.1 10*3/uL — ABNORMAL HIGH (ref 1.7–7.7)
Neutrophils Relative %: 86 %
Platelets: 400 10*3/uL (ref 150–400)
RBC: 4.1 MIL/uL — ABNORMAL LOW (ref 4.22–5.81)
RDW: 13.9 % (ref 11.5–15.5)
WBC: 11.8 10*3/uL — ABNORMAL HIGH (ref 4.0–10.5)

## 2017-08-24 LAB — BLOOD GAS, VENOUS
Acid-base deficit: 20 mmol/L — ABNORMAL HIGH (ref 0.0–2.0)
Bicarbonate: 7.9 mmol/L — ABNORMAL LOW (ref 20.0–28.0)
O2 Saturation: 37.7 %
Patient temperature: 98.6
pCO2, Ven: 23.8 mmHg — ABNORMAL LOW (ref 44.0–60.0)
pH, Ven: 7.145 — CL (ref 7.250–7.430)

## 2017-08-24 LAB — MAGNESIUM: Magnesium: 2 mg/dL (ref 1.7–2.4)

## 2017-08-24 LAB — HEPATIC FUNCTION PANEL
ALT: 12 U/L — ABNORMAL LOW (ref 17–63)
AST: 20 U/L (ref 15–41)
Albumin: 2.5 g/dL — ABNORMAL LOW (ref 3.5–5.0)
Alkaline Phosphatase: 83 U/L (ref 38–126)
Bilirubin, Direct: 0.1 mg/dL (ref 0.1–0.5)
Indirect Bilirubin: 0.8 mg/dL (ref 0.3–0.9)
Total Bilirubin: 0.9 mg/dL (ref 0.3–1.2)
Total Protein: 6.9 g/dL (ref 6.5–8.1)

## 2017-08-24 LAB — CBC
HCT: 27.3 % — ABNORMAL LOW (ref 39.0–52.0)
Hemoglobin: 9.2 g/dL — ABNORMAL LOW (ref 13.0–17.0)
MCH: 28 pg (ref 26.0–34.0)
MCHC: 33.7 g/dL (ref 30.0–36.0)
MCV: 83 fL (ref 78.0–100.0)
Platelets: 291 10*3/uL (ref 150–400)
RBC: 3.29 MIL/uL — ABNORMAL LOW (ref 4.22–5.81)
RDW: 13.7 % (ref 11.5–15.5)
WBC: 10.2 10*3/uL (ref 4.0–10.5)

## 2017-08-24 LAB — GLUCOSE, CAPILLARY
Glucose-Capillary: 121 mg/dL — ABNORMAL HIGH (ref 65–99)
Glucose-Capillary: 124 mg/dL — ABNORMAL HIGH (ref 65–99)
Glucose-Capillary: 129 mg/dL — ABNORMAL HIGH (ref 65–99)
Glucose-Capillary: 134 mg/dL — ABNORMAL HIGH (ref 65–99)
Glucose-Capillary: 136 mg/dL — ABNORMAL HIGH (ref 65–99)
Glucose-Capillary: 136 mg/dL — ABNORMAL HIGH (ref 65–99)
Glucose-Capillary: 138 mg/dL — ABNORMAL HIGH (ref 65–99)
Glucose-Capillary: 147 mg/dL — ABNORMAL HIGH (ref 65–99)
Glucose-Capillary: 154 mg/dL — ABNORMAL HIGH (ref 65–99)
Glucose-Capillary: 156 mg/dL — ABNORMAL HIGH (ref 65–99)
Glucose-Capillary: 157 mg/dL — ABNORMAL HIGH (ref 65–99)
Glucose-Capillary: 159 mg/dL — ABNORMAL HIGH (ref 65–99)
Glucose-Capillary: 159 mg/dL — ABNORMAL HIGH (ref 65–99)
Glucose-Capillary: 164 mg/dL — ABNORMAL HIGH (ref 65–99)
Glucose-Capillary: 168 mg/dL — ABNORMAL HIGH (ref 65–99)
Glucose-Capillary: 168 mg/dL — ABNORMAL HIGH (ref 65–99)
Glucose-Capillary: 179 mg/dL — ABNORMAL HIGH (ref 65–99)
Glucose-Capillary: 183 mg/dL — ABNORMAL HIGH (ref 65–99)
Glucose-Capillary: 189 mg/dL — ABNORMAL HIGH (ref 65–99)
Glucose-Capillary: 190 mg/dL — ABNORMAL HIGH (ref 65–99)
Glucose-Capillary: 194 mg/dL — ABNORMAL HIGH (ref 65–99)
Glucose-Capillary: 213 mg/dL — ABNORMAL HIGH (ref 65–99)

## 2017-08-24 LAB — CREATININE, SERUM
Creatinine, Ser: 1.43 mg/dL — ABNORMAL HIGH (ref 0.61–1.24)
GFR calc Af Amer: >60 mL/min (ref >60)
GFR calc non Af Amer: 54 mL/min — ABNORMAL LOW (ref >60)

## 2017-08-24 LAB — PHOSPHORUS: Phosphorus: 2.1 mg/dL — ABNORMAL LOW (ref 2.5–4.6)

## 2017-08-24 LAB — PROCALCITONIN: Procalcitonin: 0.57 ng/mL

## 2017-08-24 MED ORDER — SODIUM CHLORIDE 0.9 % IV SOLN
8.0000 mg | Freq: Four times a day (QID) | INTRAVENOUS | Status: DC | PRN
  Administered 2017-08-24 – 2017-08-26 (×4): 8 mg via INTRAVENOUS
  Filled 2017-08-24 (×5): qty 4

## 2017-08-24 MED ORDER — LORAZEPAM 2 MG/ML IJ SOLN
1.0000 mg | Freq: Once | INTRAMUSCULAR | Status: AC
  Administered 2017-08-24: 1 mg via INTRAVENOUS
  Filled 2017-08-24: qty 1

## 2017-08-24 MED ORDER — METOPROLOL TARTRATE 5 MG/5ML IV SOLN: 5.0000 mg | INTRAVENOUS | Status: DC | PRN

## 2017-08-24 MED ORDER — POTASSIUM CHLORIDE 10 MEQ/100ML IV SOLN
10.0000 meq | INTRAVENOUS | Status: AC
  Administered 2017-08-24 (×4): 10 meq via INTRAVENOUS
  Filled 2017-08-24 (×4): qty 100

## 2017-08-24 MED ORDER — METOPROLOL TARTRATE 5 MG/5ML IV SOLN
10.0000 mg | Freq: Four times a day (QID) | INTRAVENOUS | Status: DC
  Administered 2017-08-24 – 2017-08-25 (×5): 10 mg via INTRAVENOUS
  Filled 2017-08-24 (×5): qty 10

## 2017-08-24 MED ORDER — ONDANSETRON HCL 4 MG/2ML IJ SOLN: 4.0000 mg | Freq: Four times a day (QID) | INTRAMUSCULAR | Status: DC | PRN

## 2017-08-24 MED ORDER — LORAZEPAM 2 MG/ML IJ SOLN
1.0000 mg | INTRAMUSCULAR | Status: DC | PRN
  Administered 2017-08-24: 1 mg via INTRAVENOUS
  Filled 2017-08-24: qty 1

## 2017-08-24 MED ORDER — PROMETHAZINE HCL 25 MG/ML IJ SOLN
25.0000 mg | Freq: Four times a day (QID) | INTRAMUSCULAR | Status: DC | PRN
  Administered 2017-08-24 – 2017-08-25 (×3): 25 mg via INTRAVENOUS
  Filled 2017-08-24 (×3): qty 1

## 2017-08-24 MED ORDER — METOCLOPRAMIDE HCL 5 MG/ML IJ SOLN
10.0000 mg | Freq: Four times a day (QID) | INTRAMUSCULAR | Status: DC | PRN
  Administered 2017-08-24 – 2017-08-25 (×3): 10 mg via INTRAVENOUS
  Filled 2017-08-24 (×3): qty 2

## 2017-08-24 MED ORDER — HYDRALAZINE HCL 20 MG/ML IJ SOLN
10.0000 mg | Freq: Four times a day (QID) | INTRAMUSCULAR | Status: DC | PRN
  Administered 2017-08-24 – 2017-08-25 (×5): 10 mg via INTRAVENOUS
  Filled 2017-08-24 (×5): qty 1

## 2017-08-24 MED ORDER — PROMETHAZINE HCL 25 MG/ML IJ SOLN
12.5000 mg | Freq: Four times a day (QID) | INTRAMUSCULAR | Status: DC | PRN
  Administered 2017-08-24: 12.5 mg via INTRAVENOUS
  Filled 2017-08-24: qty 1

## 2017-08-24 MED ORDER — HYDRALAZINE HCL 20 MG/ML IJ SOLN
20.0000 mg | Freq: Once | INTRAMUSCULAR | Status: AC
  Administered 2017-08-24: 20 mg via INTRAVENOUS
  Filled 2017-08-24: qty 1

## 2017-08-24 NOTE — Progress Notes (Signed)
VASCULAR LAB PRELIMINARY  ARTERIAL  ABI completed:    RIGHT    LEFT    PRESSURE WAVEFORM  PRESSURE WAVEFORM  BRACHIAL 202 monophasic BRACHIAL 210 biphasic  DP 184 Biphasic DP 232 biphasic  AT   AT    PT N/A N/A PT 213 monophsic  PER   PER    GREAT TOE 104  GREAT TOE 115     RIGHT LEFT  ABI 0.88 1.10     HONGYING  COLE, RVT 08/24/2017, 1:05 PM

## 2017-08-24 NOTE — Progress Notes (Addendum)
PROGRESS NOTE    Chase Clark  TML:[id number] DOB: Jun 22, 1965 DOA: 08/23/2017 PCP: [name], MD     Brief Narrative:  Chase Clark is a 52 yo male with past medical history significant for cervical spondylosis C5-C6 s/p surgery, HTN, chronic diarrhea s/p cholecystectomy (followed in [place] Dr. Marius Ditch), IDDM type 2, chronic left foot ulcer followed by Dr. Vickki Muff with debridement every other week as outpatient, who now presents with nausea, vomiting. He was found to be in DKA and was admitted for further treatment on IV insulin.  Assessment & Plan:   Active Problems:   DKA (diabetic ketoacidoses) (HCC)   Pressure injury of skin  DKA  -Continue IV insulin as patient continues to have anion gap, metabolic acidosis. Once these resolve, can transition to Lantus 50u daily, Novolog SSI  -Hold home metformin  -Continue IVF -BMP q4h  Nausea, vomiting -Due to above -KUB: Nonspecific diffuse decreased bowel gas with small amount of gas in the transverse colon. -Continue antiemetic tx, reglan   Chronic left heel wound -Follows as outpatient wound care and gets debridement every other week -CT left foot: Skin ulceration on the heel without CT evidence of underlying osteomyelitis. Negative for abscess. -Orthopedic surgery evaluated patient, no acute intervention planned -Does not appear to have active infection. Will stop vanco/zosyn and monitor -Wound RN consult  Essential HTN -Unable to tolerate home oral regimen due to nausea, vomiting. Hold home norvasc, hyzaar  -Lopressor IV 10mg  q6h, hydralazine prn ordered  HLD -Hold crestor until can tolerate PO   CKD stage 3 -Baseline Cr 1.3-1.6 -Monitor BMP   Hyperbilirubinemia -Repeat LFT today     DVT prophylaxis: Lovenox  Code Status: Full Family Communication: Wife at bedside Disposition Plan: Pending clinical improvement   Consultants:   Orthopedic surgery  PCCM  Procedures:    None    Antimicrobials:  Anti-infectives (From admission, onward)   Start     Dose/Rate Route Frequency Ordered Stop   08/24/17 1600  vancomycin (VANCOCIN) IVPB 1000 mg/200 mL premix  Status:  Discontinued     1,000 mg 200 mL/hr over 60 Minutes Intravenous Every 24 hours 08/23/17 1649 08/24/17 1011   08/24/17 0200  piperacillin-tazobactam (ZOSYN) IVPB 3.375 g  Status:  Discontinued     3.375 g 12.5 mL/hr over 240 Minutes Intravenous Every 8 hours 08/23/17 1650 08/24/17 1011   08/23/17 2000  vancomycin (VANCOCIN) IVPB 1000 mg/200 mL premix     1,000 mg 200 mL/hr over 60 Minutes Intravenous  Once 08/23/17 1803 08/23/17 2200   08/23/17 1800  vancomycin (VANCOCIN) IVPB 1000 mg/200 mL premix  Status:  Discontinued     1,000 mg 200 mL/hr over 60 Minutes Intravenous  Once 08/23/17 1649 08/23/17 1803   08/23/17 1600  piperacillin-tazobactam (ZOSYN) IVPB 3.375 g     3.375 g 100 mL/hr over 30 Minutes Intravenous  Once 08/23/17 1552 08/23/17 1735   08/23/17 1400  vancomycin (VANCOCIN) IVPB 1000 mg/200 mL premix     1,000 mg 200 mL/hr over 60 Minutes Intravenous  Once 08/23/17 1350 08/23/17 1531       Subjective: Complains of nausea, vomiting   Objective: Vitals:   08/24/17 0830 08/24/17 0845 08/24/17 0907 08/24/17 1000  BP: (!) 198/95 (!) 181/93 (!) 210/95 (!) 198/99  Pulse: 97 87  94  Resp: (!) 7 13  17   Temp:      TempSrc:      SpO2: 99% 99%  100%  Weight:  Height:        Intake/Output Summary (Last 24 hours) at 08/24/2017 1115 Last data filed at 08/23/2017 1629 Gross per 24 hour  Intake 2199.75 ml  Output 700 ml  Net 1499.75 ml   Filed Weights   08/23/17 1517 08/23/17 1818  Weight: 97.5 kg (215 lb) 84.9 kg (187 lb 2.7 oz)    Examination:  General exam: Appears uncomfortable  Respiratory system: Clear to auscultation. Respiratory effort normal. Cardiovascular system: S1 & S2 heard, tachycardic, regular rhythm. No JVD, murmurs, rubs, gallops or clicks. No pedal  edema. Gastrointestinal system: Abdomen is nondistended, soft and nontender. No organomegaly or masses felt. Normal bowel sounds heard. Central nervous system: Alert and oriented. No focal neurological deficits. Extremities: Symmetric 5 x 5 power. Skin:     Psychiatry: Judgement and insight appear normal. Mood & affect appropriate.   Data Reviewed: I have personally reviewed following labs and imaging studies  CBC: Recent Labs  Lab 08/23/17 1407 08/24/17 0439  WBC 11.8* 10.2  NEUTROABS 10.1*  --   HGB 12.0* 9.2*  HCT 36.0* 27.3*  MCV 87.8 83.0  PLT 400 161   Basic Metabolic Panel: Recent Labs  Lab 08/23/17 1407 08/23/17 1823 08/23/17 2128 08/24/17 0053 08/24/17 0439 08/24/17 0854  NA 133*  --  139 140 140 143  K 5.2*  --  4.0 3.7 3.5 3.7  CL 89*  --  106 107 108 107  CO2 9*  --  13* 16* 16* 19*  GLUCOSE 652*  --  241* 170* 136* 198*  BUN 44*  --  38* 34* 32* 29*  CREATININE 2.34*  --  1.82* 1.58* 1.43*  1.39* 1.49*  CALCIUM 10.1  --  9.0 9.1 8.9 9.3  MG  --  2.0  --   --  2.0  --   PHOS  --  3.5  --   --  2.1*  --    GFR: Estimated Creatinine Clearance: 60.4 mL/min (A) (by C-G formula based on SCr of 1.49 mg/dL (H)). Liver Function Tests: Recent Labs  Lab 08/23/17 1407  AST 14*  ALT 12*  ALKPHOS 118  BILITOT 2.3*  PROT 8.7*  ALBUMIN 3.1*   Recent Labs  Lab 08/23/17 1823  LIPASE 46   No results for input(s): AMMONIA in the last 168 hours. Coagulation Profile: Recent Labs  Lab 08/23/17 1823  INR 1.20   Cardiac Enzymes: No results for input(s): CKTOTAL, CKMB, CKMBINDEX, TROPONINI in the last 168 hours. BNP (last 3 results) No results for input(s): PROBNP in the last 8760 hours. HbA1C: No results for input(s): HGBA1C in the last 72 hours. CBG: Recent Labs  Lab 08/24/17 0631 08/24/17 0735 08/24/17 0844 08/24/17 0946 08/24/17 1100  GLUCAP 129* 136* 168* 213* 190*   Lipid Profile: No results for input(s): CHOL, HDL, LDLCALC, TRIG,  CHOLHDL, LDLDIRECT in the last 72 hours. Thyroid Function Tests: No results for input(s): TSH, T4TOTAL, FREET4, T3FREE, THYROIDAB in the last 72 hours. Anemia Panel: No results for input(s): VITAMINB12, FOLATE, FERRITIN, TIBC, IRON, RETICCTPCT in the last 72 hours. Sepsis Labs: Recent Labs  Lab 08/23/17 1409 08/23/17 1823 08/24/17 0439  PROCALCITON  --  0.89 0.57  LATICACIDVEN 3.17*  --   --     Recent Results (from the past 240 hour(s))  Culture, blood (routine x 2)     Status: None (Preliminary result)   Collection Time: 08/23/17  6:23 PM  Result Value Ref Range Status   Specimen Description BLOOD LEFT  ANTECUBITAL  Final   Special Requests   Final    BOTTLES DRAWN AEROBIC AND ANAEROBIC Blood Culture adequate volume Performed at [facility], [place] [address] [place], [place] [address]    Culture PENDING  Incomplete   Report Status PENDING  Incomplete  Culture, blood (routine x 2)     Status: None (Preliminary result)   Collection Time: 08/23/17  6:23 PM  Result Value Ref Range Status   Specimen Description BLOOD LEFT HAND  Final   Special Requests   Final    BOTTLES DRAWN AEROBIC ONLY Blood Culture adequate volume Performed at [facility] [address], [place], [place] [address]    Culture PENDING  Incomplete   Report Status PENDING  Incomplete       Radiology Studies: Dg Abd 1 View  Result Date: 08/24/2017 CLINICAL DATA:  Nausea and vomiting EXAM: ABDOMEN - 1 VIEW COMPARISON:  None. FINDINGS: Nonspecific decreased bowel gas. Small amount of gas in the transverse colon. Small amount of stool in the rectum. IMPRESSION: Nonspecific diffuse decreased bowel gas with small amount of gas in the transverse colon. Electronically Signed   By: Donavan Foil M.D.   On: 08/24/2017 00:37   Ct Foot Left Wo Contrast  Result Date: 08/23/2017 CLINICAL DATA:  Diabetic patient with an ulceration on the left heel. EXAM: CT OF THE LEFT FOOT WITHOUT CONTRAST TECHNIQUE:  Multidetector CT imaging of the left foot was performed according to the standard protocol. Multiplanar CT image reconstructions were also generated. COMPARISON:  Plain films of the left foot earlier today and 07/10/2016. MRI left ankle 05/20/2016. FINDINGS: Bones/Joint/Cartilage The patient has a remote healed fracture of the posterior, superior calcaneus. Irregularity of cortical bone along the inferior margin of the calcaneus is noted but no cortical destructive change or periosteal reaction is identified. There is some sclerosis of the calcaneus eccentric toward the medial side. No fracture or dislocation. Ligaments Suboptimally assessed by CT. Muscles and Tendons Intact. Soft tissues Large skin ulceration over the heel is identified. No underlying fluid collection is present. No radiopaque foreign body or soft tissue gas. Subcutaneous edema about the ankle is noted. Atherosclerosis is noted. IMPRESSION: Skin ulceration on the heel without CT evidence of underlying osteomyelitis. Negative for abscess. Healed fracture of dorsal calcaneus. Atherosclerosis. Electronically Signed   By: Inge Rise M.D.   On: 08/23/2017 17:20   Dg Foot 2 Views Left  Result Date: 08/23/2017 CLINICAL DATA:  Diabetic patient with a nonhealing wound on the left heel. The wound is chronic. EXAM: LEFT FOOT - 2 VIEW COMPARISON:  Plain films of the left foot 07/10/2016. FINDINGS: There is a large skin wound on the plantar surface of the heel. Previously seen fracture of the dorsal calcaneus has healed. There is sclerosis in the calcaneus and irregularity of cortical bone deep to the patient's skin ulceration. No radiopaque foreign body. Atherosclerosis noted. 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 Healed calcaneal fracture. Electronically Signed   By: Inge Rise M.D.   On: 08/23/2017 14:35      Scheduled Meds: . enoxaparin (LOVENOX)  injection  40 mg Subcutaneous Q24H  . metoprolol tartrate  10 mg Intravenous Q6H  . scopolamine  1 patch Transdermal Q72H   Continuous Infusions: . dextrose 5 % and 0.45% NaCl 125 mL/hr at 08/23/17 2114  . famotidine (PEPCID) IV Stopped (08/24/17 1043)  . insulin (NOVOLIN-R) infusion 4.6 Units/hr (  08/24/17 1102)  . ondansetron (ZOFRAN) IV Stopped (08/24/17 1001)     LOS: 1 day    Time spent: 45 minutes   Dessa Phi, DO [facility] [email or web address] Password [email or web address] 08/24/2017, 11:15 AM

## 2017-08-24 NOTE — Consult Note (Signed)
Reason for Consult: Open draining wound left heel Referring Physician: Hospitalist  WOJCIECH WILLETTS is an 52 y.o. male.  HPI: Patient is a 52 year old male who is to the medical service because of significant complications related to his diabetes.  He has had a prolonged and long-standing wound on his left foot that has been treated in [employer].  He has weekly and more recently every 2-week visits for his left heel.  He has had significant drainage but has been ambulatory with this.  We are consulted for evaluation and management of this wound.  Past Medical History:  Diagnosis Date  . Arthritis   . Asthma    as child  . Coronary artery disease    DES DIAG1 11/20/09 [facility])  . Diabetes mellitus without complication ([place])   . Diverticulosis   . Edema, lower extremity   . Fatty liver   . GERD (gastroesophageal reflux disease)   . Gilbert's disease   . Glaucoma   . Heart disease 2011   Patient has stent for 80% blockage  . Hyperlipidemia   . Hypertension   . Neuropathy   . Seasonal allergies   . Shoulder pain   . Ulcer of foot ([place]) 08.06.2014   DIABETIC ULCERATIONS ASSOCIATED WITH IRRITATION LATERAL ANKLE LEFT GREATER THAN RIGHT WITH MILD CELLULITIS    Past Surgical History:  Procedure Laterality Date  . ABDOMINAL AORTOGRAM N/A 05/20/2016   Procedure: Abdominal Aortogram possible intervention;  Surgeon: Katha Cabal, MD;  Location: [place];  Service: Cardiovascular;  Laterality: N/A;  . ANTERIOR CERVICAL DECOMP/DISCECTOMY FUSION N/A 05/07/2017   Procedure: ANTERIOR CERVICAL DECOMPRESSION/DISCECTOMY Luevenia Maxin PROSTHESIS,PLATE/SCREWS CERVICAL FIVE - CERVICAL SIX;  Surgeon: Newman Pies, MD;  Location: [facility];  Service: Neurosurgery;  Laterality: N/A;  . CARDIAC CATHETERIZATION    . CATARACT EXTRACTION W/ INTRAOCULAR LENS IMPLANT    . CATARACT EXTRACTION W/PHACO Left 02/26/2017   Procedure: CATARACT EXTRACTION PHACO AND INTRAOCULAR LENS PLACEMENT (IOC);   Surgeon: Eulogio Bear, MD;  Location: [facility];  Service: Ophthalmology;  Laterality: Left;  Lot # [id number] H [place]: 00:25.3 AP%: 6.2 CDE: 1.59   . CHOLECYSTECTOMY N/A   . CORONARY ANGIOPLASTY WITH STENT PLACEMENT  2007   1st diagonal  . EPIBLEPHERON REPAIR WITH TEAR DUCT PROBING    . EYE SURGERY Right sept 29n2015   cataract extraction  . INSERTION EXPRESS TUBE SHUNT Right 09/06/2015   Procedure: INSERTION AHMED TUBE SHUNT with tutoplast allograft;  Surgeon: Eulogio Bear, MD;  Location: [facility];  Service: Ophthalmology;  Laterality: Right;  Marland Kitchen VASECTOMY      Family History  Problem Relation Age of Onset  . Hyperlipidemia Mother   . Diabetes Mother   . Liver disease Mother        NASH  . Hyperlipidemia Father   . Diabetes Father   . Heart disease Father   . Hypertension Father     Social History:  reports that he quit smoking about 9 years ago. His smoking use included cigarettes. He has never used smokeless tobacco. He reports that he drinks alcohol. He reports that he does not use drugs.  Allergies:  Allergies  Allergen Reactions  . Atorvastatin Other (See Comments)    Myalgias     Medications: I have reviewed the patient's current medications.  Results for orders placed or performed during the hospital encounter of 08/23/17 (from the past 48 hour(s))  Comprehensive metabolic panel     Status: Abnormal   Collection Time:  08/23/17  2:07 PM  Result Value Ref Range   Sodium 133 (L) 135 - 145 mmol/L   Potassium 5.2 (H) 3.5 - 5.1 mmol/L   Chloride 89 (L) 101 - 111 mmol/L   CO2 9 (L) 22 - 32 mmol/L   Glucose, Bld 652 (HH) 65 - 99 mg/dL    Comment: CRITICAL RESULT CALLED TO, READ BACK BY AND VERIFIED WITH: BRILL,M AT 1445 ON 08/23/2017 BY MOSLEY,J    BUN 44 (H) 6 - 20 mg/dL   Creatinine, Ser 2.34 (H) 0.61 - 1.24 mg/dL   Calcium 10.1 8.9 - 10.3 mg/dL   Total Protein 8.7 (H) 6.5 - 8.1 g/dL   Albumin 3.1 (L) 3.5 - 5.0 g/dL   AST 14 (L) 15 - 41 U/L   ALT 12 (L) 17  - 63 U/L   Alkaline Phosphatase 118 38 - 126 U/L   Total Bilirubin 2.3 (H) 0.3 - 1.2 mg/dL   GFR calc non Af Amer 30 (L) >60 mL/min   GFR calc Af Amer 35 (L) >60 mL/min    Comment: (NOTE) The eGFR has been calculated using the CKD EPI equation. This calculation has not been validated in all clinical situations. eGFR's persistently <60 mL/min signify possible Chronic Kidney Disease.    Anion gap 35 (H) 5 - 15    Comment: Performed at [facility], [place] [address], [place], [place] [address]  CBC with Differential/Platelet     Status: Abnormal   Collection Time: 08/23/17  2:07 PM  Result Value Ref Range   WBC 11.8 (H) 4.0 - 10.5 K/uL   RBC 4.10 (L) 4.22 - 5.81 MIL/uL   Hemoglobin 12.0 (L) 13.0 - 17.0 g/dL   HCT 36.0 (L) 39.0 - 52.0 %   MCV 87.8 78.0 - 100.0 fL   MCH 29.3 26.0 - 34.0 pg   MCHC 33.3 30.0 - 36.0 g/dL   RDW 13.9 11.5 - 15.5 %   Platelets 400 150 - 400 K/uL   Neutrophils Relative % 86 %   Lymphocytes Relative 10 %   Monocytes Relative 4 %   Eosinophils Relative 0 %   Basophils Relative 0 %   Neutro Abs 10.1 (H) 1.7 - 7.7 K/uL   Lymphs Abs 1.2 0.7 - 4.0 K/uL   Monocytes Absolute 0.5 0.1 - 1.0 K/uL   Eosinophils Absolute 0.0 0.0 - 0.7 K/uL   Basophils Absolute 0.0 0.0 - 0.1 K/uL    Comment: Performed at [facility], [place] [address] [place], [place] [address]  I-stat troponin, ED     Status: None   Collection Time: 08/23/17  2:07 PM  Result Value Ref Range   Troponin i, poc 0.01 0.00 - 0.08 ng/mL   Comment 3            Comment: Due to the release kinetics of cTnI, a negative result within the first hours of the onset of symptoms does not rule out myocardial infarction with certainty. If myocardial infarction is still suspected, repeat the test at appropriate intervals.   I-Stat CG4 Lactic Acid, ED     Status: Abnormal   Collection Time: 08/23/17  2:09 PM  Result Value Ref Range   Lactic Acid, Venous 3.17 (HH) 0.5 - 1.9  mmol/L   Comment NOTIFIED PHYSICIAN   Blood gas, venous     Status: Abnormal (Preliminary result)   Collection Time: 08/23/17  2:10 PM  Result Value Ref Range   pH, Ven PENDING 7.250 -  7.430   pCO2, Ven 23.8 (L) 44.0 - 60.0 mmHg   pO2, Ven BELOW REPORTABLE RANGE 32.0 - 45.0 mmHg    Comment: RBV DR. MACKUEN AT 1425 ON 08/23/2017 BY T.BURGESS,RRT,RCP    Bicarbonate 7.9 (L) 20.0 - 28.0 mmol/L   Acid-base deficit 20.0 (H) 0.0 - 2.0 mmol/L   O2 Saturation 37.7 %   Patient temperature 98.6    Collection site VEIN    Drawn by COLLECTED BY NURSE    Sample type VENOUS     Comment: Performed at [facility], [place] [address], [place], [place] [address]  CBG monitoring, ED     Status: Abnormal   Collection Time: 08/23/17  2:18 PM  Result Value Ref Range   Glucose-Capillary 571 (HH) 65 - 99 mg/dL   Comment 1 Notify RN    Comment 2 Document in Chart   Blood gas, arterial ([facility] & [facility] ONLY)     Status: Abnormal   Collection Time: 08/23/17  3:33 PM  Result Value Ref Range   FIO2 21.00    Delivery systems ROOM AIR    pH, Arterial 7.175 (LL) 7.350 - 7.450    Comment: RBV DR.MACKUEN AT 1549 BY T.BURGESS,RRT,RCP ON 08/23/2017    pCO2 arterial BELOW REPORTABLE RANGE 32.0 - 48.0 mmHg    Comment: RBV DR.MACKUEN AT 1549 BY T.BURGESS,RRT,RCP ON 08/23/2017    pO2, Arterial 133 (H) 83.0 - 108.0 mmHg   O2 Saturation 97.1 %   Patient temperature 98.6    Collection site BRACHIAL ARTERY    Drawn by [id number]    Sample type ARTERIAL     Comment: Performed at [facility], [place] [address], [place], [place] [address]  Urine rapid drug screen (hosp performed)not at [facility]     Status: None   Collection Time: 08/23/17  4:24 PM  Result Value Ref Range   Opiates NONE DETECTED NONE DETECTED   Cocaine NONE DETECTED NONE DETECTED   Benzodiazepines NONE DETECTED NONE DETECTED   Amphetamines NONE DETECTED NONE DETECTED   Tetrahydrocannabinol NONE DETECTED NONE DETECTED    Barbiturates NONE DETECTED NONE DETECTED    Comment: (NOTE) DRUG SCREEN FOR MEDICAL PURPOSES ONLY.  IF CONFIRMATION IS NEEDED FOR ANY PURPOSE, NOTIFY LAB WITHIN 5 DAYS. LOWEST DETECTABLE LIMITS FOR URINE DRUG SCREEN Drug Class                     Cutoff (ng/mL) Amphetamine and metabolites    1000 Barbiturate and metabolites    200 Benzodiazepine                 488 Tricyclics and metabolites     300 Opiates and metabolites        300 Cocaine and metabolites        300 THC                            50 Performed at [facility], [place] [address], [place], [place] [address]   CBG monitoring, ED     Status: Abnormal   Collection Time: 08/23/17  4:32 PM  Result Value Ref Range   Glucose-Capillary 528 (HH) 65 - 99 mg/dL  CBG monitoring, ED     Status: Abnormal   Collection Time: 08/23/17  5:38 PM  Result Value Ref Range   Glucose-Capillary 459 (H) 65 - 99 mg/dL  Culture, blood (routine x 2)     Status:  None (Preliminary result)   Collection Time: 08/23/17  6:23 PM  Result Value Ref Range   Specimen Description BLOOD LEFT ANTECUBITAL    Special Requests      BOTTLES DRAWN AEROBIC AND ANAEROBIC Blood Culture adequate volume Performed at [facility] [address] [place], [place] [address]    Culture PENDING    Report Status PENDING   Culture, blood (routine x 2)     Status: None (Preliminary result)   Collection Time: 08/23/17  6:23 PM  Result Value Ref Range   Specimen Description BLOOD LEFT HAND    Special Requests      BOTTLES DRAWN AEROBIC ONLY Blood Culture adequate volume Performed at [facility] [address] [place], [place] [address]    Culture PENDING    Report Status PENDING   Procalcitonin - Baseline     Status: None   Collection Time: 08/23/17  6:23 PM  Result Value Ref Range   Procalcitonin 0.89 ng/mL    Comment:        Interpretation: PCT > 0.5 ng/mL and <= 2 ng/mL: Systemic infection (sepsis) is possible, but other  conditions are known to elevate PCT as well. (NOTE)       Sepsis PCT Algorithm           Lower Respiratory Tract                                      Infection PCT Algorithm    ----------------------------     ----------------------------         PCT < 0.25 ng/mL                PCT < 0.10 ng/mL         Strongly encourage             Strongly discourage   discontinuation of antibiotics    initiation of antibiotics    ----------------------------     -----------------------------       PCT 0.25 - 0.50 ng/mL            PCT 0.10 - 0.25 ng/mL               OR       >80% decrease in PCT            Discourage initiation of                                            antibiotics      Encourage discontinuation           of antibiotics    ----------------------------     -----------------------------         PCT >= 0.50 ng/mL              PCT 0.26 - 0.50 ng/mL                AND       <80% decrease in PCT             Encourage initiation of  antibiotics       Encourage continuation           of antibiotics    ----------------------------     -----------------------------        PCT >= 0.50 ng/mL                  PCT > 0.50 ng/mL               AND         increase in PCT                  Strongly encourage                                      initiation of antibiotics    Strongly encourage escalation           of antibiotics                                     -----------------------------                                           PCT <= 0.25 ng/mL                                                 OR                                        > 80% decrease in PCT                                     Discontinue / Do not initiate                                             antibiotics Performed at [place] [address] [place], [place] [address]   Protime-INR     Status: None   Collection Time: 08/23/17  6:23 PM  Result  Value Ref Range   Prothrombin Time 15.1 11.4 - 15.2 seconds   INR 1.20     Comment: Performed at [facility], [place] [address], [place], [place] [address]  Magnesium     Status: None   Collection Time: 08/23/17  6:23 PM  Result Value Ref Range   Magnesium 2.0 1.7 - 2.4 mg/dL    Comment: Performed at [facility], [place] [address], [place], [place] [address]  Phosphorus     Status: None   Collection Time: 08/23/17  6:23 PM  Result Value Ref Range   Phosphorus 3.5 2.5 - 4.6 mg/dL    Comment: Performed at [facility], [place] [address], [place], [place] [address]  Lipase, blood     Status: None   Collection Time: 08/23/17  6:23 PM  Result Value Ref Range   Lipase 46 11 - 51 U/L    Comment: Performed at [facility], [place] [address], [place], [place] [address]  Glucose, capillary     Status: Abnormal   Collection Time: 08/23/17  6:40 PM  Result Value Ref Range   Glucose-Capillary 325 (H) 65 - 99 mg/dL  Glucose, capillary     Status: Abnormal   Collection Time: 08/23/17  7:43 PM  Result Value Ref Range   Glucose-Capillary 284 (H) 65 - 99 mg/dL  Glucose, capillary     Status: Abnormal   Collection Time: 08/23/17  8:47 PM  Result Value Ref Range   Glucose-Capillary 242 (H) 65 - 99 mg/dL  Basic metabolic panel     Status: Abnormal   Collection Time: 08/23/17  9:28 PM  Result Value Ref Range   Sodium 139 135 - 145 mmol/L   Potassium 4.0 3.5 - 5.1 mmol/L    Comment: DELTA CHECK NOTED   Chloride 106 101 - 111 mmol/L   CO2 13 (L) 22 - 32 mmol/L   Glucose, Bld 241 (H) 65 - 99 mg/dL   BUN 38 (H) 6 - 20 mg/dL   Creatinine, Ser 1.82 (H) 0.61 - 1.24 mg/dL   Calcium 9.0 8.9 - 10.3 mg/dL   GFR calc non Af Amer 40 (L) >60 mL/min   GFR calc Af Amer 47 (L) >60 mL/min    Comment: (NOTE) The eGFR has been calculated using the CKD EPI equation. This calculation has not been validated in all clinical situations. eGFR's  persistently <60 mL/min signify possible Chronic Kidney Disease.    Anion gap 20 (H) 5 - 15    Comment: Performed at [place], [place] [address], [place], [place] [address]  Glucose, capillary     Status: Abnormal   Collection Time: 08/23/17  9:50 PM  Result Value Ref Range   Glucose-Capillary 216 (H) 65 - 99 mg/dL   Comment 1 Notify RN    Comment 2 Document in Chart   Glucose, capillary     Status: Abnormal   Collection Time: 08/23/17 11:01 PM  Result Value Ref Range   Glucose-Capillary 197 (H) 65 - 99 mg/dL  Glucose, capillary     Status: Abnormal   Collection Time: 08/23/17 11:47 PM  Result Value Ref Range   Glucose-Capillary 157 (H) 65 - 99 mg/dL   Comment 1 Notify RN    Comment 2 Document in Chart   Glucose, capillary     Status: Abnormal   Collection Time: 08/24/17 12:46 AM  Result Value Ref Range   Glucose-Capillary 157 (H) 65 - 99 mg/dL   Comment 1 Notify RN    Comment 2 Document in Chart   Basic metabolic panel     Status: Abnormal   Collection Time: 08/24/17 12:53 AM  Result Value Ref Range   Sodium 140 135 - 145 mmol/L   Potassium 3.7 3.5 - 5.1 mmol/L   Chloride 107 101 - 111 mmol/L   CO2 16 (L) 22 - 32 mmol/L   Glucose, Bld 170 (H) 65 - 99 mg/dL   BUN 34 (H) 6 - 20 mg/dL   Creatinine, Ser 1.58 (H) 0.61 - 1.24 mg/dL   Calcium 9.1 8.9 - 10.3 mg/dL   GFR calc non Af Amer 48 (L) >60 mL/min   GFR calc Af Amer 56 (L) >60 mL/min    Comment: (NOTE) The eGFR has been calculated using the CKD  EPI equation. This calculation has not been validated in all clinical situations. eGFR's persistently <60 mL/min signify possible Chronic Kidney Disease.    Anion gap 17 (H) 5 - 15    Comment: Performed at [facility], [place] [address], [place], [place] [address]  Glucose, capillary     Status: Abnormal   Collection Time: 08/24/17  2:10 AM  Result Value Ref Range   Glucose-Capillary 154 (H) 65 - 99 mg/dL   Comment 1 Notify RN    Comment  2 Document in Chart   Glucose, capillary     Status: Abnormal   Collection Time: 08/24/17  3:02 AM  Result Value Ref Range   Glucose-Capillary 134 (H) 65 - 99 mg/dL  Glucose, capillary     Status: Abnormal   Collection Time: 08/24/17  4:09 AM  Result Value Ref Range   Glucose-Capillary 121 (H) 65 - 99 mg/dL   Comment 1 Notify RN    Comment 2 Document in Chart   Basic metabolic panel     Status: Abnormal   Collection Time: 08/24/17  4:39 AM  Result Value Ref Range   Sodium 140 135 - 145 mmol/L   Potassium 3.5 3.5 - 5.1 mmol/L   Chloride 108 101 - 111 mmol/L   CO2 16 (L) 22 - 32 mmol/L   Glucose, Bld 136 (H) 65 - 99 mg/dL   BUN 32 (H) 6 - 20 mg/dL   Creatinine, Ser 1.39 (H) 0.61 - 1.24 mg/dL   Calcium 8.9 8.9 - 10.3 mg/dL   GFR calc non Af Amer 56 (L) >60 mL/min   GFR calc Af Amer >60 >60 mL/min    Comment: (NOTE) The eGFR has been calculated using the CKD EPI equation. This calculation has not been validated in all clinical situations. eGFR's persistently <60 mL/min signify possible Chronic Kidney Disease.    Anion gap 16 (H) 5 - 15    Comment: Performed at [facility], [place] [address] [place], [place] [address]  Procalcitonin     Status: None   Collection Time: 08/24/17  4:39 AM  Result Value Ref Range   Procalcitonin 0.57 ng/mL    Comment:        Interpretation: PCT > 0.5 ng/mL and <= 2 ng/mL: Systemic infection (sepsis) is possible, but other conditions are known to elevate PCT as well. (NOTE)       Sepsis PCT Algorithm           Lower Respiratory Tract                                      Infection PCT Algorithm    ----------------------------     ----------------------------         PCT < 0.25 ng/mL                PCT < 0.10 ng/mL         Strongly encourage             Strongly discourage   discontinuation of antibiotics    initiation of antibiotics    ----------------------------     -----------------------------       PCT 0.25 - 0.50 ng/mL             PCT 0.10 - 0.25 ng/mL               OR       >  80% decrease in PCT            Discourage initiation of                                            antibiotics      Encourage discontinuation           of antibiotics    ----------------------------     -----------------------------         PCT >= 0.50 ng/mL              PCT 0.26 - 0.50 ng/mL                AND       <80% decrease in PCT             Encourage initiation of                                             antibiotics       Encourage continuation           of antibiotics    ----------------------------     -----------------------------        PCT >= 0.50 ng/mL                  PCT > 0.50 ng/mL               AND         increase in PCT                  Strongly encourage                                      initiation of antibiotics    Strongly encourage escalation           of antibiotics                                     -----------------------------                                           PCT <= 0.25 ng/mL                                                 OR                                        > 80% decrease in PCT                                     Discontinue / Do not initiate  antibiotics Performed at [facility], [place] [address] [place], [place] [address]   Creatinine, serum     Status: Abnormal   Collection Time: 08/24/17  4:39 AM  Result Value Ref Range   Creatinine, Ser 1.43 (H) 0.61 - 1.24 mg/dL   GFR calc non Af Amer 54 (L) >60 mL/min   GFR calc Af Amer >60 >60 mL/min    Comment: (NOTE) The eGFR has been calculated using the CKD EPI equation. This calculation has not been validated in all clinical situations. eGFR's persistently <60 mL/min signify possible Chronic Kidney Disease. Performed at [facility], [place] [address] [place], [place] [address]   Magnesium     Status: None   Collection Time: 08/24/17  4:39  AM  Result Value Ref Range   Magnesium 2.0 1.7 - 2.4 mg/dL    Comment: Performed at [facility], [place] [address], [place], [place] [address]  Phosphorus     Status: Abnormal   Collection Time: 08/24/17  4:39 AM  Result Value Ref Range   Phosphorus 2.1 (L) 2.5 - 4.6 mg/dL    Comment: Performed at [facility], [place] [address] [place], [place] [address]  CBC     Status: Abnormal   Collection Time: 08/24/17  4:39 AM  Result Value Ref Range   WBC 10.2 4.0 - 10.5 K/uL   RBC 3.29 (L) 4.22 - 5.81 MIL/uL   Hemoglobin 9.2 (L) 13.0 - 17.0 g/dL    Comment: REPEATED TO VERIFY DELTA CHECK NOTED    HCT 27.3 (L) 39.0 - 52.0 %   MCV 83.0 78.0 - 100.0 fL   MCH 28.0 26.0 - 34.0 pg   MCHC 33.7 30.0 - 36.0 g/dL   RDW 13.7 11.5 - 15.5 %   Platelets 291 150 - 400 K/uL    Comment: Performed at [facility], [place] [address], [place],  [address]  Glucose, capillary     Status: Abnormal   Collection Time: 08/24/17  5:22 AM  Result Value Ref Range   Glucose-Capillary 124 (H) 65 - 99 mg/dL   Comment 1 Notify RN    Comment 2 Document in Chart   Glucose, capillary     Status: Abnormal   Collection Time: 08/24/17  6:31 AM  Result Value Ref Range   Glucose-Capillary 129 (H) 65 - 99 mg/dL  Glucose, capillary     Status: Abnormal   Collection Time: 08/24/17  7:35 AM  Result Value Ref Range   Glucose-Capillary 136 (H) 65 - 99 mg/dL   Comment 1 Notify RN    Comment 2 Document in Chart     Dg Abd 1 View  Result Date: 08/24/2017 CLINICAL DATA:  Nausea and vomiting EXAM: ABDOMEN - 1 VIEW COMPARISON:  None. FINDINGS: Nonspecific decreased bowel gas. Small amount of gas in the transverse colon. Small amount of stool in the rectum. IMPRESSION: Nonspecific diffuse decreased bowel gas with small amount of gas in the transverse colon. Electronically Signed   By: Donavan Foil M.D.   On: 08/24/2017 00:37   Ct Foot Left Wo Contrast  Result Date:  08/23/2017 CLINICAL DATA:  Diabetic patient with an ulceration on the left heel. EXAM: CT OF THE LEFT FOOT WITHOUT CONTRAST TECHNIQUE: Multidetector CT imaging of the left foot was performed according to the standard protocol. Multiplanar CT image reconstructions were also generated. COMPARISON:  Plain films of the left foot earlier today and 07/10/2016. MRI left ankle 05/20/2016.  FINDINGS: Bones/Joint/Cartilage The patient has a remote healed fracture of the posterior, superior calcaneus. Irregularity of cortical bone along the inferior margin of the calcaneus is noted but no cortical destructive change or periosteal reaction is identified. There is some sclerosis of the calcaneus eccentric toward the medial side. No fracture or dislocation. Ligaments Suboptimally assessed by CT. Muscles and Tendons Intact. Soft tissues Large skin ulceration over the heel is identified. No underlying fluid collection is present. No radiopaque foreign body or soft tissue gas. Subcutaneous edema about the ankle is noted. Atherosclerosis is noted. IMPRESSION: Skin ulceration on the heel without CT evidence of underlying osteomyelitis. Negative for abscess. Healed fracture of dorsal calcaneus. Atherosclerosis. Electronically Signed   By: Inge Rise M.D.   On: 08/23/2017 17:20   Dg Foot 2 Views Left  Result Date: 08/23/2017 CLINICAL DATA:  Diabetic patient with a nonhealing wound on the left heel. The wound is chronic. EXAM: LEFT FOOT - 2 VIEW COMPARISON:  Plain films of the left foot 07/10/2016. FINDINGS: There is a large skin wound on the plantar surface of the heel. Previously seen fracture of the dorsal calcaneus has healed. There is sclerosis in the calcaneus and irregularity of cortical bone deep to the patient's skin ulceration. No radiopaque foreign body. Atherosclerosis noted. 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 Healed calcaneal  fracture. Electronically Signed   By: Inge Rise M.D.   On: 08/23/2017 14:35    ROS  ROS: I have reviewed the patient's review of systems thoroughly and there are no positive responses as relates to the HPI. Blood pressure 124/70, pulse (!) 102, temperature 98.6 F (37 C), temperature source Oral, resp. rate 16, height '5\' 11"'  (1.803 m), weight 84.9 kg (187 lb 2.7 oz), SpO2 98 %. Physical Exam Well-developed well-nourished patient in no acute distress. Alert and oriented x3 HEENT:within normal limits Cardiac: Regular rate and rhythm Pulmonary: Lungs clear to auscultation Abdomen: Soft and nontender.  Normal active bowel sounds  Musculoskeletal: Left heel: There is a complex 3 x 3 cm wound over the heel that looks clean.  There is good granulation tissue.  There is a flap over the most distal portion that probably covers more deeper extension of this wound. 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  It is currently being followed in wound center in [facility].  The wound is clean and does not need any further evaluation at this point.  If the patient were desirous of additional evaluation and intervention then he could see Dr. Meridee Score in [place].  I will discuss the case with him while the patient is hospitalized to see if he has any additional thoughts but otherwise the patient should get his medical situation cleared and return to his outpatient management of his left foot wound.  If any additional issues arise on this patient during his hospitalization please all hesitate to contact me.  I believe that a wound care consult will be appropriate and I discussed this with his managing nurse today.  Alta Corning 08/24/2017, 8:21 AM

## 2017-08-24 NOTE — Progress Notes (Signed)
  PROGRESS NOTE  Followed up on patient this afternoon. Patient received IV ativan and appears much more comfortable. He is sleeping and calm. BP much improved. Continue IV insulin, will follow up on repeat BMP this afternoon to see if we can transition him to subq insulin. Continue IV antiemetic tx.    Dessa Phi, DO [facility] [email or web address] Password [email or web address] 08/24/2017, 1:25 PM

## 2017-08-24 NOTE — Consult Note (Signed)
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  Seen at [facility] wound care center.  Has been using promogran collagen dressing.  Wound bed is pale pink and nongranulating.  Undermining present and patient notably noncompliant with offloading.  Has been counseled regarding potential loss of limb due to infection if he does not comply with ordered treatment regimen.   Wound type:neuropathic  Pressure Injury POA: NA Measurement: 3 cm x 4 cm x 0.5 cm, undermining present from 1-12, extends 1 cm  Wound WAQ:LRJP pink nongranulating Drainage (amount, consistency, odor) minimal serosanguinous Periwound:macerated Dressing procedure/placement/frequency: Cleanse left heel wound with NS and pat gently dry. Apply small piece of Aquacel Ag to wound bed.  Pad with 4x4 gauze and kerlix/tape.  Change MOn/Wed/Fri.  Bedside RN to perform.  Will not follow at this time.  Please re-consult if needed.  Domenic Moras RN BSN [place] Pager [phone]

## 2017-08-25 ENCOUNTER — Other Ambulatory Visit: Payer: Self-pay

## 2017-08-25 LAB — BASIC METABOLIC PANEL
Anion gap: 7 (ref 5–15)
Anion gap: 11 (ref 5–15)
Anion gap: 11 (ref 5–15)
BUN: 20 mg/dL (ref 6–20)
BUN: 19 mg/dL (ref 6–20)
BUN: 18 mg/dL (ref 6–20)
CO2: 20 mmol/L — ABNORMAL LOW (ref 22–32)
CO2: 19 mmol/L — ABNORMAL LOW (ref 22–32)
CO2: 20 mmol/L — ABNORMAL LOW (ref 22–32)
Calcium: 9 mg/dL (ref 8.9–10.3)
Calcium: 9.2 mg/dL (ref 8.9–10.3)
Calcium: 9 mg/dL (ref 8.9–10.3)
Chloride: 116 mmol/L — ABNORMAL HIGH (ref 101–111)
Chloride: 110 mmol/L (ref 101–111)
Chloride: 111 mmol/L (ref 101–111)
Creatinine, Ser: 1.37 mg/dL — ABNORMAL HIGH (ref 0.61–1.24)
Creatinine, Ser: 1.2 mg/dL (ref 0.61–1.24)
Creatinine, Ser: 1.08 mg/dL (ref 0.61–1.24)
GFR calc Af Amer: >60 mL/min (ref >60)
GFR calc Af Amer: >60 mL/min (ref >60)
GFR calc Af Amer: >60 mL/min (ref >60)
GFR calc non Af Amer: 57 mL/min — ABNORMAL LOW (ref >60)
GFR calc non Af Amer: >60 mL/min (ref >60)
GFR calc non Af Amer: >60 mL/min (ref >60)
Glucose, Bld: 172 mg/dL — ABNORMAL HIGH (ref 65–99)
Glucose, Bld: 133 mg/dL — ABNORMAL HIGH (ref 65–99)
Glucose, Bld: 153 mg/dL — ABNORMAL HIGH (ref 65–99)
Potassium: 3.2 mmol/L — ABNORMAL LOW (ref 3.5–5.1)
Potassium: 3.4 mmol/L — ABNORMAL LOW (ref 3.5–5.1)
Potassium: 3.3 mmol/L — ABNORMAL LOW (ref 3.5–5.1)
Sodium: 143 mmol/L (ref 135–145)
Sodium: 140 mmol/L (ref 135–145)
Sodium: 142 mmol/L (ref 135–145)

## 2017-08-25 LAB — CBC
HCT: 29.3 % — ABNORMAL LOW (ref 39.0–52.0)
Hemoglobin: 9.9 g/dL — ABNORMAL LOW (ref 13.0–17.0)
MCH: 28.4 pg (ref 26.0–34.0)
MCHC: 33.8 g/dL (ref 30.0–36.0)
MCV: 84.2 fL (ref 78.0–100.0)
Platelets: 327 10*3/uL (ref 150–400)
RBC: 3.48 MIL/uL — ABNORMAL LOW (ref 4.22–5.81)
RDW: 14.2 % (ref 11.5–15.5)
WBC: 9.9 10*3/uL (ref 4.0–10.5)

## 2017-08-25 LAB — GLUCOSE, CAPILLARY
Glucose-Capillary: 123 mg/dL — ABNORMAL HIGH (ref 65–99)
Glucose-Capillary: 134 mg/dL — ABNORMAL HIGH (ref 65–99)
Glucose-Capillary: 138 mg/dL — ABNORMAL HIGH (ref 65–99)
Glucose-Capillary: 143 mg/dL — ABNORMAL HIGH (ref 65–99)
Glucose-Capillary: 145 mg/dL — ABNORMAL HIGH (ref 65–99)
Glucose-Capillary: 146 mg/dL — ABNORMAL HIGH (ref 65–99)
Glucose-Capillary: 147 mg/dL — ABNORMAL HIGH (ref 65–99)
Glucose-Capillary: 164 mg/dL — ABNORMAL HIGH (ref 65–99)
Glucose-Capillary: 173 mg/dL — ABNORMAL HIGH (ref 65–99)
Glucose-Capillary: 174 mg/dL — ABNORMAL HIGH (ref 65–99)
Glucose-Capillary: 180 mg/dL — ABNORMAL HIGH (ref 65–99)
Glucose-Capillary: 181 mg/dL — ABNORMAL HIGH (ref 65–99)
Glucose-Capillary: 189 mg/dL — ABNORMAL HIGH (ref 65–99)

## 2017-08-25 LAB — PROCALCITONIN: Procalcitonin: 0.3 ng/mL

## 2017-08-25 LAB — MRSA PCR SCREENING: MRSA by PCR: NEGATIVE

## 2017-08-25 MED ORDER — LOSARTAN POTASSIUM-HCTZ 100-25 MG PO TABS: 1.0000 | ORAL_TABLET | Freq: Every day | ORAL | Status: DC

## 2017-08-25 MED ORDER — PROMETHAZINE HCL 25 MG/ML IJ SOLN
25.0000 mg | Freq: Four times a day (QID) | INTRAMUSCULAR | Status: DC
  Administered 2017-08-25 – 2017-08-27 (×6): 25 mg via INTRAVENOUS
  Filled 2017-08-25 (×7): qty 1

## 2017-08-25 MED ORDER — POTASSIUM CHLORIDE CRYS ER 20 MEQ PO TBCR
40.0000 meq | EXTENDED_RELEASE_TABLET | Freq: Once | ORAL | Status: DC
  Filled 2017-08-25: qty 2

## 2017-08-25 MED ORDER — AMLODIPINE BESYLATE 10 MG PO TABS
10.0000 mg | ORAL_TABLET | Freq: Every day | ORAL | Status: DC
  Administered 2017-08-25 – 2017-08-26 (×2): 10 mg via ORAL
  Filled 2017-08-25 (×2): qty 1

## 2017-08-25 MED ORDER — POTASSIUM CHLORIDE 10 MEQ/100ML IV SOLN
10.0000 meq | INTRAVENOUS | Status: AC
  Administered 2017-08-25 (×4): 10 meq via INTRAVENOUS
  Filled 2017-08-25 (×4): qty 100

## 2017-08-25 MED ORDER — HYDROCHLOROTHIAZIDE 25 MG PO TABS
25.0000 mg | ORAL_TABLET | Freq: Every day | ORAL | Status: DC
  Administered 2017-08-25 – 2017-08-28 (×4): 25 mg via ORAL
  Filled 2017-08-25 (×4): qty 1

## 2017-08-25 MED ORDER — INSULIN GLARGINE 100 UNIT/ML ~~LOC~~ SOLN
50.0000 [IU] | Freq: Two times a day (BID) | SUBCUTANEOUS | Status: DC
  Administered 2017-08-25 – 2017-08-26 (×4): 50 [IU] via SUBCUTANEOUS
  Filled 2017-08-25 (×6): qty 0.5

## 2017-08-25 MED ORDER — HYDRALAZINE HCL 20 MG/ML IJ SOLN
20.0000 mg | Freq: Once | INTRAMUSCULAR | Status: AC
  Administered 2017-08-25: 20 mg via INTRAVENOUS
  Filled 2017-08-25: qty 1

## 2017-08-25 MED ORDER — LABETALOL HCL 5 MG/ML IV SOLN
10.0000 mg | INTRAVENOUS | Status: DC | PRN
  Filled 2017-08-25: qty 4

## 2017-08-25 MED ORDER — METOCLOPRAMIDE HCL 5 MG/ML IJ SOLN
10.0000 mg | Freq: Four times a day (QID) | INTRAMUSCULAR | Status: DC
  Administered 2017-08-25 – 2017-08-26 (×4): 10 mg via INTRAVENOUS
  Filled 2017-08-25 (×4): qty 2

## 2017-08-25 MED ORDER — METOPROLOL TARTRATE 50 MG PO TABS
100.0000 mg | ORAL_TABLET | Freq: Two times a day (BID) | ORAL | Status: DC
  Administered 2017-08-25 – 2017-08-28 (×7): 100 mg via ORAL
  Filled 2017-08-25: qty 2
  Filled 2017-08-25: qty 4
  Filled 2017-08-25: qty 2
  Filled 2017-08-25: qty 4
  Filled 2017-08-25 (×3): qty 2

## 2017-08-25 MED ORDER — INSULIN ASPART 100 UNIT/ML ~~LOC~~ SOLN
0.0000 [IU] | SUBCUTANEOUS | Status: DC
  Administered 2017-08-25: 2 [IU] via SUBCUTANEOUS
  Administered 2017-08-25: 1 [IU] via SUBCUTANEOUS
  Administered 2017-08-25 (×2): 2 [IU] via SUBCUTANEOUS
  Administered 2017-08-26 (×3): 1 [IU] via SUBCUTANEOUS
  Administered 2017-08-27: 3 [IU] via SUBCUTANEOUS
  Administered 2017-08-27: 5 [IU] via SUBCUTANEOUS
  Administered 2017-08-27: 2 [IU] via SUBCUTANEOUS
  Administered 2017-08-28: 1 [IU] via SUBCUTANEOUS
  Administered 2017-08-28: 2 [IU] via SUBCUTANEOUS
  Administered 2017-08-28: 3 [IU] via SUBCUTANEOUS

## 2017-08-25 MED ORDER — DIPHENHYDRAMINE HCL 25 MG PO CAPS
25.0000 mg | ORAL_CAPSULE | Freq: Every evening | ORAL | Status: DC | PRN
  Administered 2017-08-25: 25 mg via ORAL
  Filled 2017-08-25: qty 1

## 2017-08-25 MED ORDER — LOSARTAN POTASSIUM 50 MG PO TABS
100.0000 mg | ORAL_TABLET | Freq: Every day | ORAL | Status: DC
  Administered 2017-08-25 – 2017-08-28 (×4): 100 mg via ORAL
  Filled 2017-08-25 (×4): qty 2

## 2017-08-25 MED ORDER — FAMOTIDINE 20 MG PO TABS
20.0000 mg | ORAL_TABLET | Freq: Two times a day (BID) | ORAL | Status: DC
  Administered 2017-08-25 – 2017-08-28 (×7): 20 mg via ORAL
  Filled 2017-08-25 (×7): qty 1

## 2017-08-25 NOTE — Telephone Encounter (Signed)
Patients wife states patient is in critical care and she would like to know if patient could have the omni pod dexcom sensor.she states she already spoke to someone about getting this approved. She states she will call back with fax number where to fax this rx

## 2017-08-25 NOTE — Progress Notes (Addendum)
PROGRESS NOTE    Chase Clark  HQP:[id number] DOB: February 08, 1966 DOA: 08/23/2017 PCP: [name], MD     Brief Narrative:  Chase Clark is a 52 yo male with past medical history significant for cervical spondylosis C5-C6 s/p surgery, HTN, chronic diarrhea s/p cholecystectomy (followed in [place] Dr. Marius Ditch), IDDM type 2, chronic left foot ulcer followed by Dr. Vickki Muff with debridement every other week as outpatient, who now presents with nausea, vomiting. He was found to be in DKA and was admitted for further treatment on IV insulin.  Assessment & Plan:   Active Problems:   DKA (diabetic ketoacidoses) (HCC)   Pressure injury of skin  DKA  -Transition to Lantus 50u BID, Novolog SSI  -Hold home metformin   Nausea, vomiting -Due to above -KUB: Nonspecific diffuse decreased bowel gas with small amount of gas in the transverse colon. -Continue antiemetic tx, reglan  -Clear liquid diet   Chronic left heel wound -Follows as outpatient wound care and gets debridement every other week -CT left foot: Skin ulceration on the heel without CT evidence of underlying osteomyelitis. Negative for abscess. -Does not appear to have active infection. Will stop vanco/zosyn and monitor -Wound RN consult -Orthopedic surgery following   Essential HTN -Resume home norvasc, hyzaar  -Lopressor IV 10mg  q6h, hydralazine prn ordered   CKD stage 3 -Baseline Cr 1.3-1.6 -Monitor BMP   Hyperbilirubinemia -Resolved with IVF   Hypokalemia -Replace, trend    DVT prophylaxis: Lovenox  Code Status: Full Family Communication: Wife at bedside Disposition Plan: Transfer to med-surg, home once nausea and vomiting have improved    Consultants:   Orthopedic surgery  PCCM  Procedures:    None   Antimicrobials:  Anti-infectives (From admission, onward)   Start     Dose/Rate Route Frequency Ordered Stop   08/24/17 1600  vancomycin (VANCOCIN) IVPB 1000 mg/200 mL premix  Status:   Discontinued     1,000 mg 200 mL/hr over 60 Minutes Intravenous Every 24 hours 08/23/17 1649 08/24/17 1011   08/24/17 0200  piperacillin-tazobactam (ZOSYN) IVPB 3.375 g  Status:  Discontinued     3.375 g 12.5 mL/hr over 240 Minutes Intravenous Every 8 hours 08/23/17 1650 08/24/17 1011   08/23/17 2000  vancomycin (VANCOCIN) IVPB 1000 mg/200 mL premix     1,000 mg 200 mL/hr over 60 Minutes Intravenous  Once 08/23/17 1803 08/23/17 2200   08/23/17 1800  vancomycin (VANCOCIN) IVPB 1000 mg/200 mL premix  Status:  Discontinued     1,000 mg 200 mL/hr over 60 Minutes Intravenous  Once 08/23/17 1649 08/23/17 1803   08/23/17 1600  piperacillin-tazobactam (ZOSYN) IVPB 3.375 g     3.375 g 100 mL/hr over 30 Minutes Intravenous  Once 08/23/17 1552 08/23/17 1735   08/23/17 1400  vancomycin (VANCOCIN) IVPB 1000 mg/200 mL premix     1,000 mg 200 mL/hr over 60 Minutes Intravenous  Once 08/23/17 1350 08/23/17 1531       Subjective: Continues to have nausea, dry heaving. Denies abdominal pain or diarrhea.   Objective: Vitals:   08/25/17 1010 08/25/17 1100 08/25/17 1130 08/25/17 1200  BP: (!) 184/90 (!) 185/97 (!) 188/92 (!) 170/89  Pulse:  97 (!) 102 87  Resp:  16 14 18   Temp:      TempSrc:      SpO2:  99% 98% 96%  Weight:      Height:        Intake/Output Summary (Last 24 hours) at 08/25/2017 1215 Last data  filed at 08/24/2017 2329 Gross per 24 hour  Intake 512.45 ml  Output 950 ml  Net -437.55 ml   Filed Weights   08/23/17 1517 08/23/17 1818  Weight: 97.5 kg (215 lb) 84.9 kg (187 lb 2.7 oz)   Examination: General exam: Appears calm Respiratory system: Clear to auscultation. Respiratory effort normal. Cardiovascular system: S1 & S2 heard, RRR. No JVD, murmurs, rubs, gallops or clicks. No pedal edema. Gastrointestinal system: Abdomen is nondistended, soft and nontender. No organomegaly or masses felt.  Central nervous system: Alert and oriented. No focal neurological  deficits. Extremities: Symmetric 5 x 5 power. Skin: No rashes, lesions or ulcers Psychiatry: Judgement and insight appear normal. Mood & affect appropriate.   Data Reviewed: I have personally reviewed following labs and imaging studies  CBC: Recent Labs  Lab 08/23/17 1407 08/24/17 0439 08/25/17 0452  WBC 11.8* 10.2 9.9  NEUTROABS 10.1*  --   --   HGB 12.0* 9.2* 9.9*  HCT 36.0* 27.3* 29.3*  MCV 87.8 83.0 84.2  PLT 400 291 956   Basic Metabolic Panel: Recent Labs  Lab 08/23/17 1823  08/24/17 0439  08/24/17 1610 08/24/17 2004 08/25/17 0024 08/25/17 0452 08/25/17 0807  NA  --    < > 140   < > 141 141 143 140 142  K  --    < > 3.5   < > 3.7 3.8 3.2* 3.4* 3.3*  CL  --    < > 108   < > 110 113* 116* 110 111  CO2  --    < > 16*   < > 17* 15* 20* 19* 20*  GLUCOSE  --    < > 136*   < > 167* 178* 172* 133* 153*  BUN  --    < > 32*   < > 25* 22* 20 19 18   CREATININE  --    < > 1.43*  1.39*   < > 1.29* 1.30* 1.37* 1.20 1.08  CALCIUM  --    < > 8.9   < > 8.9 9.2 9.0 9.2 9.0  MG 2.0  --  2.0  --   --   --   --   --   --   PHOS 3.5  --  2.1*  --   --   --   --   --   --    < > = values in this interval not displayed.   GFR: Estimated Creatinine Clearance: 83.3 mL/min (by C-G formula based on SCr of 1.08 mg/dL). Liver Function Tests: Recent Labs  Lab 08/23/17 1407 08/24/17 1223  AST 14* 20  ALT 12* 12*  ALKPHOS 118 83  BILITOT 2.3* 0.9  PROT 8.7* 6.9  ALBUMIN 3.1* 2.5*   Recent Labs  Lab 08/23/17 1823  LIPASE 46   No results for input(s): AMMONIA in the last 168 hours. Coagulation Profile: Recent Labs  Lab 08/23/17 1823  INR 1.20   Cardiac Enzymes: No results for input(s): CKTOTAL, CKMB, CKMBINDEX, TROPONINI in the last 168 hours. BNP (last 3 results) No results for input(s): PROBNP in the last 8760 hours. HbA1C: No results for input(s): HGBA1C in the last 72 hours. CBG: Recent Labs  Lab 08/25/17 0659 08/25/17 0817 08/25/17 0921 08/25/17 1047  08/25/17 1156  GLUCAP 123* 146* 181* 180* 189*   Lipid Profile: No results for input(s): CHOL, HDL, LDLCALC, TRIG, CHOLHDL, LDLDIRECT in the last 72 hours. Thyroid Function Tests: No results for input(s): TSH, T4TOTAL, FREET4,  T3FREE, THYROIDAB in the last 72 hours. Anemia Panel: No results for input(s): VITAMINB12, FOLATE, FERRITIN, TIBC, IRON, RETICCTPCT in the last 72 hours. Sepsis Labs: Recent Labs  Lab 08/23/17 1409 08/23/17 1823 08/24/17 0439 08/25/17 0452  PROCALCITON  --  0.89 0.57 0.30  LATICACIDVEN 3.17*  --   --   --     Recent Results (from the past 240 hour(s))  Culture, blood (routine x 2)     Status: None (Preliminary result)   Collection Time: 08/23/17  6:23 PM  Result Value Ref Range Status   Specimen Description BLOOD LEFT ANTECUBITAL  Final   Special Requests   Final    BOTTLES DRAWN AEROBIC AND ANAEROBIC Blood Culture adequate volume   Culture   Final    NO GROWTH 1 DAY Performed at [facility], [place] [address] [place], [place] [address]    Report Status PENDING  Incomplete  Culture, blood (routine x 2)     Status: None (Preliminary result)   Collection Time: 08/23/17  6:23 PM  Result Value Ref Range Status   Specimen Description BLOOD LEFT HAND  Final   Special Requests   Final    BOTTLES DRAWN AEROBIC ONLY Blood Culture adequate volume   Culture   Final    NO GROWTH 1 DAY Performed at [facility], [place] [address], [place], [place] [address]    Report Status PENDING  Incomplete       Radiology Studies: Dg Abd 1 View  Result Date: 08/24/2017 CLINICAL DATA:  Nausea and vomiting EXAM: ABDOMEN - 1 VIEW COMPARISON:  None. FINDINGS: Nonspecific decreased bowel gas. Small amount of gas in the transverse colon. Small amount of stool in the rectum. IMPRESSION: Nonspecific diffuse decreased bowel gas with small amount of gas in the transverse colon. Electronically Signed   By: Donavan Foil M.D.   On: 08/24/2017 00:37   Ct Foot Left Wo  Contrast  Result Date: 08/23/2017 CLINICAL DATA:  Diabetic patient with an ulceration on the left heel. EXAM: CT OF THE LEFT FOOT WITHOUT CONTRAST TECHNIQUE: Multidetector CT imaging of the left foot was performed according to the standard protocol. Multiplanar CT image reconstructions were also generated. COMPARISON:  Plain films of the left foot earlier today and 07/10/2016. MRI left ankle 05/20/2016. FINDINGS: Bones/Joint/Cartilage The patient has a remote healed fracture of the posterior, superior calcaneus. Irregularity of cortical bone along the inferior margin of the calcaneus is noted but no cortical destructive change or periosteal reaction is identified. There is some sclerosis of the calcaneus eccentric toward the medial side. No fracture or dislocation. Ligaments Suboptimally assessed by CT. Muscles and Tendons Intact. Soft tissues Large skin ulceration over the heel is identified. No underlying fluid collection is present. No radiopaque foreign body or soft tissue gas. Subcutaneous edema about the ankle is noted. Atherosclerosis is noted. IMPRESSION: Skin ulceration on the heel without CT evidence of underlying osteomyelitis. Negative for abscess. Healed fracture of dorsal calcaneus. Atherosclerosis. Electronically Signed   By: Inge Rise M.D.   On: 08/23/2017 17:20   Dg Foot 2 Views Left  Result Date: 08/23/2017 CLINICAL DATA:  Diabetic patient with a nonhealing wound on the left heel. The wound is chronic. EXAM: LEFT FOOT - 2 VIEW COMPARISON:  Plain films of the left foot 07/10/2016. FINDINGS: There is a large skin wound on the plantar surface of the heel. Previously seen fracture of the dorsal calcaneus has healed. There is sclerosis in the calcaneus and  irregularity of cortical bone deep to the patient's skin ulceration. No radiopaque foreign body. Atherosclerosis noted. 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 Healed calcaneal fracture. Electronically Signed   By: Inge Rise M.D.   On: 08/23/2017 14:35      Scheduled Meds: . amLODipine  10 mg Oral Daily  . enoxaparin (LOVENOX) injection  40 mg Subcutaneous Q24H  . famotidine  20 mg Oral BID  . losartan  100 mg Oral Daily   And  . hydrochlorothiazide  25 mg Oral Daily  . insulin aspart  0-9 Units Subcutaneous Q4H  . insulin glargine  50 Units Subcutaneous BID  . metoCLOPramide  10 mg Intravenous Q6H  . metoprolol tartrate  10 mg Intravenous Q6H  . promethazine  25 mg Intravenous Q6H  . scopolamine  1 patch Transdermal Q72H   Continuous Infusions: . ondansetron (ZOFRAN) IV 8 mg (08/25/17 0602)  . potassium chloride 10 mEq (08/25/17 1200)     LOS: 2 days    Time spent: 25 minutes   Dessa Phi, DO [facility] [email or web address] Password [facility] 08/25/2017, 12:15 PM

## 2017-08-25 NOTE — Telephone Encounter (Signed)
Patients wife states that they have met their deductible and this should be covered, she would like this sent to his pharmacy so that he can have it before he leaves the hospital.  Dexicom G6 Receiver kit 1-pack Dexicom G6 Transmitter kit (every 3 months) Dexicom G6 Sensor Kit 3-pack ( every 30 days)

## 2017-08-25 NOTE — Telephone Encounter (Signed)
We can certainly look into this though it may be helpful to have him see an endocrinologist to help manage his diabetes.  They would be able to help get him set up with a sensor if it is necessary.  If they are willing to see endocrinology I can place a referral.

## 2017-08-25 NOTE — Telephone Encounter (Signed)
Copied from [place] [phone]. Topic: General - Other >> Aug 25, 2017  8:44 AM Yvette Rack wrote: Reason for CRM: patient wife [name] calling to speak with [name] about her husband being in the hospital please call pt wife at [phone] to discuss his blood sugars

## 2017-08-25 NOTE — Telephone Encounter (Signed)
Patients wife notified and states they really trust you and would like for you to manage his diabetes. She states if you recommend endo then they are ok with that but they would rather you manage his diabetes.

## 2017-08-25 NOTE — Progress Notes (Signed)
Subjective: Pt asleep this AM   Objective: Vital signs in last 24 hours: Temp:  [97.5 F (36.4 C)-98.9 F (37.2 C)] 97.5 F (36.4 C) (05/14 0800) Pulse Rate:  [82-116] 94 (05/14 0800) Resp:  [11-25] 15 (05/14 0800) BP: (101-221)/(43-120) 210/100 (05/14 0800) SpO2:  [94 %-100 %] 98 % (05/14 0800)  Intake/Output from previous day: 05/13 0701 - 05/14 0700 In: 2485.4 [I.V.:2485.4] Out: 950 [Urine:950] Intake/Output this shift: No intake/output data recorded.  Recent Labs    08/23/17 1407 08/24/17 0439 08/25/17 0452  HGB 12.0* 9.2* 9.9*   Recent Labs    08/24/17 0439 08/25/17 0452  WBC 10.2 9.9  RBC 3.29* 3.48*  HCT 27.3* 29.3*  PLT 291 327   Recent Labs    08/25/17 0452 08/25/17 0807  NA 140 142  K 3.4* 3.3*  CL 110 111  CO2 19* 20*  BUN 19 18  CREATININE 1.20 1.08  GLUCOSE 133* 153*  CALCIUM 9.2 9.0   Recent Labs    08/23/17 1823  INR 1.20    no exam performed      Assessment/Plan: Persistent severe open wound that is much unchanged from the past year.  His options are to continue local wound care or pursue BKA but feel he needs to maximize medical situation before he pursue BKA.  Also would like vascular to weigh in on his current vascular studies.  I will see patiet later today and discuss this further.  This conversation today was with his wife.   Alta Corning 08/25/2017, 9:14 AM

## 2017-08-25 NOTE — Progress Notes (Signed)
Inpatient Diabetes Program Recommendations  AACE/ADA: New Consensus Statement on Inpatient Glycemic Control (2015)  Target Ranges:  Prepandial:   less than 140 mg/dL      Peak postprandial:   less than 180 mg/dL (1-2 hours)      Critically ill patients:  140 - 180 mg/dL   Lab Results  Component Value Date   GLUCAP 189 (H) 08/25/2017   HGBA1C 9.9 (H) 04/10/2017    Review of Glycemic Control  Diabetes history: DM2 Outpatient Diabetes medications: Lantus 50 units bid, Novolog 01-27-19 tidwcm metformin 1000 mg bid Current orders for Inpatient glycemic control: Lantus 50 units bid, Novolog 0-9 units Q4H   Inpatient Diabetes Program Recommendations:     Need updated HgbA1C. Last one 9.9% on 04/10/2017 When CHO mod diet begins, add Novolog 10 units tidwc  Pt too sleepy to talk. Spoke with wife at length regarding pt's glycemic control at home. Wife states "pt needs a pump to manage his diabetes." States he is very non-compliant with checking blood sugars and taking prescribed insulin. "I think he's learned his lesson now."  Follow-up in am.  Thank you. Lorenda Peck, RD, LDN, CDE Inpatient Diabetes Coordinator [phone]

## 2017-08-25 NOTE — Telephone Encounter (Signed)
I am been going to forward to [place] for her to review to instruct me on how to order these things for the patient.  I think he would benefit from continuous glucose monitoring.  He has been on 4 insulin injections daily.

## 2017-08-25 NOTE — Progress Notes (Deleted)
Subjective: Pt has pain r foot.  Never has had drainage or wound over lateral foot.  Has callouses over foot    Objective: Vital signs in last 24 hours: Temp:  [97.5 F (36.4 C)-98.9 F (37.2 C)] 97.5 F (36.4 C) (05/14 0800) Pulse Rate:  [82-116] 94 (05/14 0800) Resp:  [11-25] 15 (05/14 0800) BP: (101-221)/(43-120) 210/100 (05/14 0800) SpO2:  [94 %-100 %] 98 % (05/14 0800)  Intake/Output from previous day: 05/13 0701 - 05/14 0700 In: 2485.4 [I.V.:2485.4] Out: 950 [Urine:950] Intake/Output this shift: No intake/output data recorded.  Recent Labs    08/23/17 1407 08/24/17 0439 08/25/17 0452  HGB 12.0* 9.2* 9.9*   Recent Labs    08/24/17 0439 08/25/17 0452  WBC 10.2 9.9  RBC 3.29* 3.48*  HCT 27.3* 29.3*  PLT 291 327   Recent Labs    08/25/17 0024 08/25/17 0452  NA 143 140  K 3.2* 3.4*  CL 116* 110  CO2 20* 19*  BUN 20 19  CREATININE 1.37* 1.20  GLUCOSE 172* 133*  CALCIUM 9.0 9.2   Recent Labs    08/23/17 1823  INR 1.20    Neurologically intact ABD soft No cellulitis present Compartment soft      Assessment/Plan: 52 yo male with painful foot but no overt signs of infection.   Will await MRI results but feel that risk of any surgery coukld make his foot situation worse.  Will follow and review MRI when done.  Will debride callouses in hospital as well.   Alta Corning 08/25/2017, 8:43 AM

## 2017-08-25 NOTE — Progress Notes (Signed)
Patient ID: Chase Clark, male   DOB: October 26, 1965, 52 y.o.   MRN: [id number] I came back by to see the patient at lunch today.  He was sleeping.  His wife states that she had discussed our overall conversation with him at length.  I will come back by to discuss this with him further.  At her request will be tomorrow morning.

## 2017-08-25 NOTE — Progress Notes (Signed)
Patient transferred from [facility]. RN agrees with previous assessment charged by [employer] nurse. Patient alert and oriented x 4 but drowsy. Family at bedside. Call bell within reach. Rn will continue to monitor the patient.

## 2017-08-26 DIAGNOSIS — I10 Essential (primary) hypertension: Secondary | ICD-10-CM

## 2017-08-26 DIAGNOSIS — E876 Hypokalemia: Secondary | ICD-10-CM

## 2017-08-26 DIAGNOSIS — L8990 Pressure ulcer of unspecified site, unspecified stage: Secondary | ICD-10-CM

## 2017-08-26 DIAGNOSIS — D649 Anemia, unspecified: Secondary | ICD-10-CM

## 2017-08-26 DIAGNOSIS — N183 Chronic kidney disease, stage 3 (moderate): Secondary | ICD-10-CM

## 2017-08-26 DIAGNOSIS — K219 Gastro-esophageal reflux disease without esophagitis: Secondary | ICD-10-CM

## 2017-08-26 DIAGNOSIS — E1110 Type 2 diabetes mellitus with ketoacidosis without coma: Principal | ICD-10-CM

## 2017-08-26 LAB — CBC
HCT: 28.6 % — ABNORMAL LOW (ref 39.0–52.0)
Hemoglobin: 9.6 g/dL — ABNORMAL LOW (ref 13.0–17.0)
MCH: 28.4 pg (ref 26.0–34.0)
MCHC: 33.6 g/dL (ref 30.0–36.0)
MCV: 84.6 fL (ref 78.0–100.0)
Platelets: 261 10*3/uL (ref 150–400)
RBC: 3.38 MIL/uL — ABNORMAL LOW (ref 4.22–5.81)
RDW: 14 % (ref 11.5–15.5)
WBC: 7.5 10*3/uL (ref 4.0–10.5)

## 2017-08-26 LAB — BASIC METABOLIC PANEL
Anion gap: 11 (ref 5–15)
BUN: 14 mg/dL (ref 6–20)
CO2: 22 mmol/L (ref 22–32)
Calcium: 9.1 mg/dL (ref 8.9–10.3)
Chloride: 106 mmol/L (ref 101–111)
Creatinine, Ser: 1.05 mg/dL (ref 0.61–1.24)
GFR calc Af Amer: >60 mL/min (ref >60)
GFR calc non Af Amer: >60 mL/min (ref >60)
Glucose, Bld: 135 mg/dL — ABNORMAL HIGH (ref 65–99)
Potassium: 3.2 mmol/L — ABNORMAL LOW (ref 3.5–5.1)
Sodium: 139 mmol/L (ref 135–145)

## 2017-08-26 LAB — GLUCOSE, CAPILLARY
Glucose-Capillary: 120 mg/dL — ABNORMAL HIGH (ref 65–99)
Glucose-Capillary: 122 mg/dL — ABNORMAL HIGH (ref 65–99)
Glucose-Capillary: 126 mg/dL — ABNORMAL HIGH (ref 65–99)
Glucose-Capillary: 130 mg/dL — ABNORMAL HIGH (ref 65–99)
Glucose-Capillary: 145 mg/dL — ABNORMAL HIGH (ref 65–99)
Glucose-Capillary: 69 mg/dL (ref 65–99)

## 2017-08-26 MED ORDER — METOCLOPRAMIDE HCL 5 MG/ML IJ SOLN
5.0000 mg | Freq: Four times a day (QID) | INTRAMUSCULAR | Status: DC
  Administered 2017-08-26 (×2): 5 mg via INTRAVENOUS
  Filled 2017-08-26 (×3): qty 2

## 2017-08-26 MED ORDER — DEXCOM G6 SENSOR MISC: 1.0000 | 11 refills | Status: DC

## 2017-08-26 MED ORDER — DEXCOM G6 RECEIVER DEVI: 1.0000 | 0 refills | Status: DC

## 2017-08-26 MED ORDER — BRIMONIDINE TARTRATE 0.2 % OP SOLN
1.0000 [drp] | Freq: Two times a day (BID) | OPHTHALMIC | Status: DC
  Administered 2017-08-26 – 2017-08-27 (×3): 1 [drp] via OPHTHALMIC
  Filled 2017-08-26: qty 5

## 2017-08-26 MED ORDER — ASPIRIN EC 81 MG PO TBEC
81.0000 mg | DELAYED_RELEASE_TABLET | Freq: Every day | ORAL | Status: DC
  Administered 2017-08-26 – 2017-08-28 (×3): 81 mg via ORAL
  Filled 2017-08-26 (×3): qty 1

## 2017-08-26 MED ORDER — ALUM & MAG HYDROXIDE-SIMETH 200-200-20 MG/5ML PO SUSP
30.0000 mL | ORAL | Status: DC | PRN
  Administered 2017-08-26 (×2): 30 mL via ORAL
  Filled 2017-08-26 (×2): qty 30

## 2017-08-26 MED ORDER — PANTOPRAZOLE SODIUM 40 MG PO TBEC
40.0000 mg | DELAYED_RELEASE_TABLET | Freq: Every day | ORAL | Status: DC
  Administered 2017-08-26 – 2017-08-27 (×2): 40 mg via ORAL
  Filled 2017-08-26 (×2): qty 1

## 2017-08-26 MED ORDER — TIMOLOL MALEATE 0.5 % OP SOLN
1.0000 [drp] | Freq: Two times a day (BID) | OPHTHALMIC | Status: DC
  Administered 2017-08-26 – 2017-08-27 (×3): 1 [drp] via OPHTHALMIC
  Filled 2017-08-26: qty 5

## 2017-08-26 MED ORDER — DEXCOM G6 TRANSMITTER MISC: 1.0000 | 3 refills | Status: DC

## 2017-08-26 MED ORDER — LATANOPROST 0.005 % OP SOLN
1.0000 [drp] | Freq: Every day | OPHTHALMIC | Status: DC
  Administered 2017-08-26 – 2017-08-27 (×2): 1 [drp] via OPHTHALMIC
  Filled 2017-08-26: qty 2.5

## 2017-08-26 MED ORDER — ADULT MULTIVITAMIN W/MINERALS CH
1.0000 | ORAL_TABLET | Freq: Every day | ORAL | Status: DC
  Administered 2017-08-26 – 2017-08-28 (×3): 1 via ORAL
  Filled 2017-08-26 (×3): qty 1

## 2017-08-26 MED ORDER — POTASSIUM CHLORIDE CRYS ER 10 MEQ PO TBCR
40.0000 meq | EXTENDED_RELEASE_TABLET | Freq: Once | ORAL | Status: AC
  Administered 2017-08-26: 40 meq via ORAL
  Filled 2017-08-26: qty 4

## 2017-08-26 MED ORDER — ROSUVASTATIN CALCIUM 10 MG PO TABS
20.0000 mg | ORAL_TABLET | Freq: Every day | ORAL | Status: DC
  Administered 2017-08-26 – 2017-08-27 (×2): 20 mg via ORAL
  Filled 2017-08-26 (×2): qty 2

## 2017-08-26 NOTE — Addendum Note (Signed)
Addended by: Deirdre Pippins E on: 08/26/2017 12:10 PM   Modules accepted: Orders

## 2017-08-26 NOTE — Telephone Encounter (Signed)
Left message to notify rx has been sent to pharmacy

## 2017-08-26 NOTE — Telephone Encounter (Signed)
Left message to notify patients wife that we are waiting on response form pharmacist and will call her as soon as we hear back.

## 2017-08-26 NOTE — Progress Notes (Signed)
PROGRESS NOTE    Chase Clark  NID:[id number] DOB: September 21, 1965 DOA: 08/23/2017 PCP: [name], MD   Brief Narrative:  HPI On 08/23/2017 by Dr. Verneita Griffes  52 year old male history of cervical spondylosis C5-6 status post surgery, HTN, chronic diarrhea status post cholecystectomy 4/13 followed in [place] Dr. Marius Ditch, diabetes mellitus type 2 for the past 25 years on insulin chronic left foot ulcer that was followed by Dr. Barbaraann Cao regional and had an unsuccessful angio done sometime in the last year  -he currently gets his wound debrided by [facility] regional physician every other week for the last 3 or 4 weeks by Dr. Linton Rump Admit 5/12 nausea vomiting--has been having 3 to 4 days of nausea vomiting and has not really eaten in the past couple of days only having soup and liquids When they checked his labs his sugar was reading in the 600 range Wound has been looking clean he has not had any new issues with it was debrided couple of days ago and he has not noticed any oozing-his wife is an Therapist, sports and dress it regularly for him  Interim history Patient admitted for DKA, currently transitioned to Lantus and insulin sliding scale.  Assessment & Plan   DKA/uncontrolled diabetes mellitus, type II -Patient initially found to be in DKA and required IV insulin -Currently transitioned to Lantus as well as insulin sliding scale -Metformin held -Per patient's wife, patient has been noncompliant  Nausea and vomiting -Suspect secondary to the above -KUB showed nonspecific diffuse decreased bowel gas and small amount of gas in the transverse colon -Continue antiemetics as needed as well as Reglan (wife worried about the side effects of Reglan, will decrease dose to 5mg ) -Has tolerated clear liquid diet, will transition to full liquids  GERD -Patient complains of some indigestion.  States Pepcid does not work -Have ordered [employer] as well as\Mylanta  Chronic left heel wound -Patient  has been following with wound care as an outpatient in [place] -CT left foot showed skin ulceration on the heel without CT evidence of underlying osteomyelitis.  Negative for abscess -Patient initially placed on vancomycin and Zosyn however this was discontinued as patient did not appear to have active infection -Wound care consulted -Orthopedic surgery, Dr. Berenice Primas, consulted and appreciated  Essential hypertension -Continue amlodipine, Hyzaar, as needed Lopressor and hydralazine  Chronic kidney disease, stage III -Appears to be at baseline, continue to monitor BMP   Hyperbilirubinemia -Resolved with IVF  Hypokalemia -will replace and continue to monitor BMP  Chronic normocytic anemia -Hemoglobin appears to be stable, continue to monitor CBC  DVT Prophylaxis Lovenox  Code Status: Full  Family Communication: Wife at bedside  Disposition Plan: Admitted.  Suspect home when patient is able to tolerate soft diet and nausea and vomiting have improved  Consultants PCCM Orthopedic surgery  Procedures  None  Antibiotics   Anti-infectives (From admission, onward)   Start     Dose/Rate Route Frequency Ordered Stop   08/24/17 1600  vancomycin (VANCOCIN) IVPB 1000 mg/200 mL premix  Status:  Discontinued     1,000 mg 200 mL/hr over 60 Minutes Intravenous Every 24 hours 08/23/17 1649 08/24/17 1011   08/24/17 0200  piperacillin-tazobactam (ZOSYN) IVPB 3.375 g  Status:  Discontinued     3.375 g 12.5 mL/hr over 240 Minutes Intravenous Every 8 hours 08/23/17 1650 08/24/17 1011   08/23/17 2000  vancomycin (VANCOCIN) IVPB 1000 mg/200 mL premix     1,000 mg 200 mL/hr over 60 Minutes  Intravenous  Once 08/23/17 1803 08/23/17 2200   08/23/17 1800  vancomycin (VANCOCIN) IVPB 1000 mg/200 mL premix  Status:  Discontinued     1,000 mg 200 mL/hr over 60 Minutes Intravenous  Once 08/23/17 1649 08/23/17 1803   08/23/17 1600  piperacillin-tazobactam (ZOSYN) IVPB 3.375 g     3.375 g 100 mL/hr  over 30 Minutes Intravenous  Once 08/23/17 1552 08/23/17 1735   08/23/17 1400  vancomycin (VANCOCIN) IVPB 1000 mg/200 mL premix     1,000 mg 200 mL/hr over 60 Minutes Intravenous  Once 08/23/17 1350 08/23/17 1531      Subjective:   Chase Clark seen and examined today.  Complains of being sleepy this morning.  Feels his nausea and vomiting have improved some.  Does complain of some acid reflux pain, burning.  agrees with increasing his diet to full liquids.  Denies further nausea vomiting.  Denies current chest pain, shortness of breath, abdominal pain, dizziness or headache.  Objective:   Vitals:   08/25/17 1500 08/25/17 1800 08/25/17 2010 08/26/17 0411  BP: 100/71 (!) 156/89 113/78 (!) 151/98  Pulse: 88 76 76 74  Resp: 15 13 20 18   Temp:   98.6 F (37 C) 98.7 F (37.1 C)  TempSrc:   Oral Oral  SpO2: 95% 98% 96% 99%  Weight:      Height:        Intake/Output Summary (Last 24 hours) at 08/26/2017 1128 Last data filed at 08/25/2017 1500 Gross per 24 hour  Intake 632.14 ml  Output 400 ml  Net 232.14 ml   Filed Weights   08/23/17 1517 08/23/17 1818  Weight: 97.5 kg (215 lb) 84.9 kg (187 lb 2.7 oz)    Exam  General: Well developed, well nourished, NAD, appears stated age  86: NCAT, mucous membranes moist.   Neck: Supple  Cardiovascular: S1 S2 auscultated, no rubs, murmurs or gallops. Regular rate and rhythm.  Respiratory: Clear to auscultation bilaterally with equal chest rise  Abdomen: Soft, nontender, nondistended, + bowel sounds  Extremities: warm dry without cyanosis clubbing or edema. Left foot wrapped  Neuro: AAOx3, nonfocal  Psych: appropriate mood and affect   Data Reviewed: I have personally reviewed following labs and imaging studies  CBC: Recent Labs  Lab 08/23/17 1407 08/24/17 0439 08/25/17 0452 08/26/17 0543  WBC 11.8* 10.2 9.9 7.5  NEUTROABS 10.1*  --   --   --   HGB 12.0* 9.2* 9.9* 9.6*  HCT 36.0* 27.3* 29.3* 28.6*  MCV 87.8 83.0  84.2 84.6  PLT 400 291 327 517   Basic Metabolic Panel: Recent Labs  Lab 08/23/17 1823  08/24/17 0439  08/24/17 2004 08/25/17 0024 08/25/17 0452 08/25/17 0807 08/26/17 0543  NA  --    < > 140   < > 141 143 140 142 139  K  --    < > 3.5   < > 3.8 3.2* 3.4* 3.3* 3.2*  CL  --    < > 108   < > 113* 116* 110 111 106  CO2  --    < > 16*   < > 15* 20* 19* 20* 22  GLUCOSE  --    < > 136*   < > 178* 172* 133* 153* 135*  BUN  --    < > 32*   < > 22* 20 19 18 14   CREATININE  --    < > 1.43*  1.39*   < > 1.30* 1.37* 1.20 1.08 1.05  CALCIUM  --    < > 8.9   < > 9.2 9.0 9.2 9.0 9.1  MG 2.0  --  2.0  --   --   --   --   --   --   PHOS 3.5  --  2.1*  --   --   --   --   --   --    < > = values in this interval not displayed.   GFR: Estimated Creatinine Clearance: 85.7 mL/min (by C-G formula based on SCr of 1.05 mg/dL). Liver Function Tests: Recent Labs  Lab 08/23/17 1407 08/24/17 1223  AST 14* 20  ALT 12* 12*  ALKPHOS 118 83  BILITOT 2.3* 0.9  PROT 8.7* 6.9  ALBUMIN 3.1* 2.5*   Recent Labs  Lab 08/23/17 1823  LIPASE 46   No results for input(s): AMMONIA in the last 168 hours. Coagulation Profile: Recent Labs  Lab 08/23/17 1823  INR 1.20   Cardiac Enzymes: No results for input(s): CKTOTAL, CKMB, CKMBINDEX, TROPONINI in the last 168 hours. BNP (last 3 results) No results for input(s): PROBNP in the last 8760 hours. HbA1C: No results for input(s): HGBA1C in the last 72 hours. CBG: Recent Labs  Lab 08/25/17 1609 08/25/17 2008 08/25/17 2324 08/26/17 0410 08/26/17 0712  GLUCAP 174* 164* 138* 126* 145*   Lipid Profile: No results for input(s): CHOL, HDL, LDLCALC, TRIG, CHOLHDL, LDLDIRECT in the last 72 hours. Thyroid Function Tests: No results for input(s): TSH, T4TOTAL, FREET4, T3FREE, THYROIDAB in the last 72 hours. Anemia Panel: No results for input(s): VITAMINB12, FOLATE, FERRITIN, TIBC, IRON, RETICCTPCT in the last 72 hours. Urine analysis:    Component Value  Date/Time   COLORURINE YELLOW (A) 04/29/2017 1907   APPEARANCEUR HAZY (A) 04/29/2017 1907   LABSPEC 1.018 04/29/2017 1907   PHURINE 5.0 04/29/2017 1907   GLUCOSEU NEGATIVE 04/29/2017 1907   HGBUR SMALL (A) 04/29/2017 1907   BILIRUBINUR NEGATIVE 04/29/2017 1907   KETONESUR NEGATIVE 04/29/2017 1907   PROTEINUR >=300 (A) 04/29/2017 1907   UROBILINOGEN 0.2 10/22/2006 0948   NITRITE NEGATIVE 04/29/2017 1907   LEUKOCYTESUR NEGATIVE 04/29/2017 1907   Sepsis Labs: @LABRCNTIP (procalcitonin:4,lacticidven:4)  ) Recent Results (from the past 240 hour(s))  Culture, blood (routine x 2)     Status: None (Preliminary result)   Collection Time: 08/23/17  6:23 PM  Result Value Ref Range Status   Specimen Description BLOOD LEFT ANTECUBITAL  Final   Special Requests   Final    BOTTLES DRAWN AEROBIC AND ANAEROBIC Blood Culture adequate volume   Culture   Final    NO GROWTH 1 DAY Performed at [facility], [place] [address], [place], [place] [address]    Report Status PENDING  Incomplete  Culture, blood (routine x 2)     Status: None (Preliminary result)   Collection Time: 08/23/17  6:23 PM  Result Value Ref Range Status   Specimen Description BLOOD LEFT HAND  Final   Special Requests   Final    BOTTLES DRAWN AEROBIC ONLY Blood Culture adequate volume   Culture   Final    NO GROWTH 1 DAY Performed at [facility], [place] [address], [place],  [address]    Report Status PENDING  Incomplete  MRSA PCR Screening     Status: None   Collection Time: 08/25/17  9:00 AM  Result Value Ref Range Status   MRSA by PCR NEGATIVE NEGATIVE Final    Comment:  The GeneXpert MRSA Assay (FDA approved for NASAL specimens only), is one component of a comprehensive MRSA colonization surveillance program. It is not intended to diagnose MRSA infection nor to guide or monitor treatment for MRSA infections. Performed at [facility], [place] [address], [place], [place]  [address]       Radiology Studies: No results found.   Scheduled Meds: . amLODipine  10 mg Oral Daily  . aspirin EC  81 mg Oral Daily  . brimonidine  1 drop Right Eye BID   And  . timolol  1 drop Right Eye BID  . enoxaparin (LOVENOX) injection  40 mg Subcutaneous Q24H  . famotidine  20 mg Oral BID  . losartan  100 mg Oral Daily   And  . hydrochlorothiazide  25 mg Oral Daily  . insulin aspart  0-9 Units Subcutaneous Q4H  . insulin glargine  50 Units Subcutaneous BID  . latanoprost  1 drop Both Eyes QHS  . metoCLOPramide  5 mg Intravenous Q6H  . metoprolol tartrate  100 mg Oral BID  . multivitamin with minerals  1 tablet Oral Daily  . pantoprazole  40 mg Oral Daily  . promethazine  25 mg Intravenous Q6H  . rosuvastatin  20 mg Oral q1800  . scopolamine  1 patch Transdermal Q72H   Continuous Infusions: . ondansetron (ZOFRAN) IV 8 mg (08/25/17 0602)     LOS: 3 days   Time Spent in minutes   40 minutes  [name] Mikhail D.O. on 08/26/2017 at 11:28 AM  Between 7am to 7pm - Pager - [phone]  After 7pm go to [email or web address] - password [email or web address]  And look for the night coverage person covering for me after hours  [facility] Office  [phone]

## 2017-08-26 NOTE — Progress Notes (Signed)
Subjective: Feeling better overall.   Objective: Vital signs in last 24 hours: Temp:  [98.6 F (37 C)-98.7 F (37.1 C)] 98.7 F (37.1 C) (05/15 0411) Pulse Rate:  [74-88] 74 (05/15 0411) Resp:  [13-20] 18 (05/15 0411) BP: (100-185)/(71-98) 151/98 (05/15 0411) SpO2:  [95 %-99 %] 99 % (05/15 0411)  Intake/Output from previous day: 05/14 0701 - 05/15 0700 In: 632.1 [I.V.:70.1; IV Piggyback:562] Out: 400 [Urine:400] Intake/Output this shift: No intake/output data recorded.  Recent Labs    08/23/17 1407 08/24/17 0439 08/25/17 0452 08/26/17 0543  HGB 12.0* 9.2* 9.9* 9.6*   Recent Labs    08/25/17 0452 08/26/17 0543  WBC 9.9 7.5  RBC 3.48* 3.38*  HCT 29.3* 28.6*  PLT 327 261   Recent Labs    08/25/17 0807 08/26/17 0543  NA 142 139  K 3.3* 3.2*  CL 111 106  CO2 20* 22  BUN 18 14  CREATININE 1.08 1.05  GLUCOSE 153* 135*  CALCIUM 9.0 9.1   Recent Labs    08/23/17 1823  INR 1.20    Neurologically intact ABD soft Neurovascular intact Sensation intact distally No cellulitis present Compartment soft      Assessment/Plan: Open draining clean wound r foot // Nothuing more top do on this admit for that.  Interested in moving care to [facility].  Will see him in three weeks in office Will need wound center referral in [place]  Would benefitr from vascular surgery eval as in or out patient   Chase Clark 08/26/2017, 12:13 PM

## 2017-08-26 NOTE — Telephone Encounter (Signed)
Spoke with rep from [place], reference # [id number], she states that [place] supplies are covered for patient under medical benefits (should be able to still bill this at a regular pharmacy with is insurance). CPT codes: Receiver [phone], transmitter [id number], sensors [id number].   Orders are pended for Dr. Ellen Henri review.   Carlean Jews, Pharm.D., BCPS PGY2 Ambulatory Care Pharmacy Resident Phone: [phone]

## 2017-08-26 NOTE — Telephone Encounter (Signed)
I have sent these to his pharmacy.

## 2017-08-26 NOTE — Addendum Note (Signed)
Addended by: [name] on: 08/26/2017 12:23 PM   Modules accepted: Orders

## 2017-08-27 LAB — CBC
HCT: 30 % — ABNORMAL LOW (ref 39.0–52.0)
Hemoglobin: 9.8 g/dL — ABNORMAL LOW (ref 13.0–17.0)
MCH: 27.9 pg (ref 26.0–34.0)
MCHC: 32.7 g/dL (ref 30.0–36.0)
MCV: 85.5 fL (ref 78.0–100.0)
Platelets: 268 10*3/uL (ref 150–400)
RBC: 3.51 MIL/uL — ABNORMAL LOW (ref 4.22–5.81)
RDW: 13.7 % (ref 11.5–15.5)
WBC: 10.3 10*3/uL (ref 4.0–10.5)

## 2017-08-27 LAB — BASIC METABOLIC PANEL
Anion gap: 10 (ref 5–15)
BUN: 18 mg/dL (ref 6–20)
CO2: 24 mmol/L (ref 22–32)
Calcium: 9 mg/dL (ref 8.9–10.3)
Chloride: 103 mmol/L (ref 101–111)
Creatinine, Ser: 1.55 mg/dL — ABNORMAL HIGH (ref 0.61–1.24)
GFR calc Af Amer: 57 mL/min — ABNORMAL LOW (ref >60)
GFR calc non Af Amer: 49 mL/min — ABNORMAL LOW (ref >60)
Glucose, Bld: 95 mg/dL (ref 65–99)
Potassium: 3.3 mmol/L — ABNORMAL LOW (ref 3.5–5.1)
Sodium: 137 mmol/L (ref 135–145)

## 2017-08-27 LAB — GLUCOSE, CAPILLARY
Glucose-Capillary: 107 mg/dL — ABNORMAL HIGH (ref 65–99)
Glucose-Capillary: 152 mg/dL — ABNORMAL HIGH (ref 65–99)
Glucose-Capillary: 182 mg/dL — ABNORMAL HIGH (ref 65–99)
Glucose-Capillary: 219 mg/dL — ABNORMAL HIGH (ref 65–99)
Glucose-Capillary: 290 mg/dL — ABNORMAL HIGH (ref 65–99)
Glucose-Capillary: 33 mg/dL — CL (ref 65–99)
Glucose-Capillary: 34 mg/dL — CL (ref 65–99)
Glucose-Capillary: 36 mg/dL — CL (ref 65–99)
Glucose-Capillary: 80 mg/dL (ref 65–99)
Glucose-Capillary: 91 mg/dL (ref 65–99)

## 2017-08-27 LAB — MAGNESIUM: Magnesium: 2 mg/dL (ref 1.7–2.4)

## 2017-08-27 MED ORDER — ESOMEPRAZOLE MAGNESIUM 40 MG PO CPDR
40.0000 mg | DELAYED_RELEASE_CAPSULE | Freq: Every day | ORAL | Status: DC
  Administered 2017-08-27 – 2017-08-28 (×2): 40 mg via ORAL
  Filled 2017-08-27 (×2): qty 1

## 2017-08-27 MED ORDER — PROMETHAZINE HCL 25 MG PO TABS: 25.0000 mg | ORAL_TABLET | Freq: Four times a day (QID) | ORAL | Status: DC | PRN

## 2017-08-27 MED ORDER — POTASSIUM CHLORIDE CRYS ER 10 MEQ PO TBCR
40.0000 meq | EXTENDED_RELEASE_TABLET | Freq: Once | ORAL | Status: AC
  Administered 2017-08-27: 40 meq via ORAL
  Filled 2017-08-27 (×2): qty 4

## 2017-08-27 MED ORDER — DEXTROSE 50 % IV SOLN
INTRAVENOUS | Status: AC
  Administered 2017-08-27: 25 mL
  Filled 2017-08-27: qty 50

## 2017-08-27 MED ORDER — INSULIN GLARGINE 100 UNIT/ML ~~LOC~~ SOLN
50.0000 [IU] | Freq: Every day | SUBCUTANEOUS | Status: DC
  Administered 2017-08-27 – 2017-08-28 (×2): 50 [IU] via SUBCUTANEOUS
  Filled 2017-08-27 (×2): qty 0.5

## 2017-08-27 MED ORDER — METOCLOPRAMIDE HCL 5 MG PO TABS
5.0000 mg | ORAL_TABLET | Freq: Three times a day (TID) | ORAL | Status: DC
  Administered 2017-08-27 – 2017-08-28 (×5): 5 mg via ORAL
  Filled 2017-08-27 (×5): qty 1

## 2017-08-27 NOTE — Progress Notes (Signed)
Inpatient Diabetes Program Recommendations  AACE/ADA: New Consensus Statement on Inpatient Glycemic Control (2015)  Target Ranges:  Prepandial:   less than 140 mg/dL      Peak postprandial:   less than 180 mg/dL (1-2 hours)      Critically ill patients:  140 - 180 mg/dL   Lab Results  Component Value Date   GLUCAP 182 (H) 08/27/2017   HGBA1C 9.9 (H) 04/10/2017    Review of Glycemic Control  Had hypoglycemia this am. Pt states he takes Lantus 25 units bid instead of 50 units bid at home. Orders changed.  Blood sugars trending well. Need updated HgbA1C.  Inpatient Diabetes Program Recommendations:     Please order HgbA1C to assess glycemic control prior to admission.  Continue to follow glucose trends.  Thank you. Lorenda Peck, RD, LDN, CDE Inpatient Diabetes Coordinator [phone]

## 2017-08-27 NOTE — Progress Notes (Addendum)
PROGRESS NOTE    Chase Clark  KNL:[id number] DOB: Nov 15, 1965 DOA: 08/23/2017 PCP: [name], MD   Brief Narrative:  HPI On 08/23/2017 by Dr. Verneita Griffes  52 year old male history of cervical spondylosis C5-6 status post surgery, HTN, chronic diarrhea status post cholecystectomy 4/13 followed in [place] Dr. Marius Ditch, diabetes mellitus type 2 for the past 25 years on insulin chronic left foot ulcer that was followed by Dr. Barbaraann Cao regional and had an unsuccessful angio done sometime in the last year  -he currently gets his wound debrided by [facility] regional physician every other week for the last 3 or 4 weeks by Dr. Linton Rump Admit 5/12 nausea vomiting--has been having 3 to 4 days of nausea vomiting and has not really eaten in the past couple of days only having soup and liquids When they checked his labs his sugar was reading in the 600 range Wound has been looking clean he has not had any new issues with it was debrided couple of days ago and he has not noticed any oozing-his wife is an Therapist, sports and dress it regularly for him  Interim history Patient admitted for DKA, currently transitioned to Lantus and insulin sliding scale.  Assessment & Plan   DKA/uncontrolled diabetes mellitus, type II, complicated by hypoglycemic episodes -Patient initially found to be in DKA and required IV insulin -Transitioned to Lantus as well as insulin sliding scale -Metformin held -Per patient's wife, patient has been noncompliant -patient had some hypoglycemic episodes overnight -Will discontinue Lantus 50 units nightly, continue morning dose and insulin sliding scale  Nausea and vomiting -Suspect secondary to the above -KUB showed nonspecific diffuse decreased bowel gas and small amount of gas in the transverse colon -Continue antiemetics as needed as well as Reglan (wife worried about the side effects of Reglan, will decrease dose to 5mg ) -Patient is transition to full liquid diet and  tolerated well.  Will transition to soft diet  GERD -Patient complains of some indigestion.  States Pepcid does not work -Have placed patient on Nexium  Chronic left heel wound -Patient has been following with wound care as an outpatient in [facility] -CT left foot showed skin ulceration on the heel without CT evidence of underlying osteomyelitis.  Negative for abscess -Patient initially placed on vancomycin and Zosyn however this was discontinued as patient did not appear to have active infection -Wound care consulted -Orthopedic surgery, Dr. Berenice Primas, consulted and appreciated.  Discussed with Dr. Berenice Primas, recommended wound care here in [place].  Feels patient may be a candidate for BKA once his diabetes is well controlled.  Essential hypertension -Continue amlodipine, Hyzaar, as needed Lopressor and hydralazine  Chronic kidney disease, stage III -Appears to be at baseline, continue to monitor BMP   Hyperbilirubinemia -Resolved with IVF  Hypokalemia -Continue to replace and and monitor BMP -will add on magnesium level  Chronic normocytic anemia -Hemoglobin appears to be stable, continue to monitor CBC  DVT Prophylaxis Lovenox  Code Status: Full  Family Communication: Daughter at bedside. Sister via phone.  Disposition Plan: Admitted.  Suspect home when patient is able to tolerate soft diet and nausea and vomiting have improved- likely within the next 24 hours  Consultants PCCM Orthopedic surgery  Procedures  None  Antibiotics   Anti-infectives (From admission, onward)   Start     Dose/Rate Route Frequency Ordered Stop   08/24/17 1600  vancomycin (VANCOCIN) IVPB 1000 mg/200 mL premix  Status:  Discontinued     1,000 mg 200 mL/hr  over 60 Minutes Intravenous Every 24 hours 08/23/17 1649 08/24/17 1011   08/24/17 0200  piperacillin-tazobactam (ZOSYN) IVPB 3.375 g  Status:  Discontinued     3.375 g 12.5 mL/hr over 240 Minutes Intravenous Every 8 hours 08/23/17 1650  08/24/17 1011   08/23/17 2000  vancomycin (VANCOCIN) IVPB 1000 mg/200 mL premix     1,000 mg 200 mL/hr over 60 Minutes Intravenous  Once 08/23/17 1803 08/23/17 2200   08/23/17 1800  vancomycin (VANCOCIN) IVPB 1000 mg/200 mL premix  Status:  Discontinued     1,000 mg 200 mL/hr over 60 Minutes Intravenous  Once 08/23/17 1649 08/23/17 1803   08/23/17 1600  piperacillin-tazobactam (ZOSYN) IVPB 3.375 g     3.375 g 100 mL/hr over 30 Minutes Intravenous  Once 08/23/17 1552 08/23/17 1735   08/23/17 1400  vancomycin (VANCOCIN) IVPB 1000 mg/200 mL premix     1,000 mg 200 mL/hr over 60 Minutes Intravenous  Once 08/23/17 1350 08/23/17 1531      Subjective:   Chase Clark seen and examined today.  Patient states he is feeling much better this morning.  No longer complaining of nausea, acid reflux type pain, abdominal pain.  Denies current chest pain, shortness breath, dizziness, headache.  Objective:   Vitals:   08/26/17 0411 08/26/17 1425 08/26/17 1943 08/27/17 0422  BP: (!) 151/98 123/84 (!) 145/98 (!) 94/52  Pulse: 74 70 78 62  Resp: 18 16 16  (!) 22  Temp: 98.7 F (37.1 C) 98.8 F (37.1 C) 99.1 F (37.3 C)   TempSrc: Oral Oral Oral   SpO2: 99% 98% 99% 100%  Weight:      Height:        Intake/Output Summary (Last 24 hours) at 08/27/2017 1044 Last data filed at 08/27/2017 0931 Gross per 24 hour  Intake 360 ml  Output -  Net 360 ml   Filed Weights   08/23/17 1517 08/23/17 1818  Weight: 97.5 kg (215 lb) 84.9 kg (187 lb 2.7 oz)   Exam  General: Well developed, well nourished, NAD, appears stated age  HEENT: NCAT, mucous membranes moist.   Neck: Supple  Cardiovascular: S1 S2 auscultated, no murmurs, RRR  Respiratory: Clear to auscultation bilaterally with equal chest rise  Abdomen: Soft, nontender, nondistended, + bowel sounds  Extremities: warm dry without cyanosis clubbing or edema. Left foot with dressing in place  Neuro: AAOx3, nonfocal  Psych: Pleasant,  appropriate mood and affect  Data Reviewed: I have personally reviewed following labs and imaging studies  CBC: Recent Labs  Lab 08/23/17 1407 08/24/17 0439 08/25/17 0452 08/26/17 0543 08/27/17 0608  WBC 11.8* 10.2 9.9 7.5 10.3  NEUTROABS 10.1*  --   --   --   --   HGB 12.0* 9.2* 9.9* 9.6* 9.8*  HCT 36.0* 27.3* 29.3* 28.6* 30.0*  MCV 87.8 83.0 84.2 84.6 85.5  PLT 400 291 327 261 740   Basic Metabolic Panel: Recent Labs  Lab 08/23/17 1823  08/24/17 0439  08/25/17 0024 08/25/17 0452 08/25/17 0807 08/26/17 0543 08/27/17 0608  NA  --    < > 140   < > 143 140 142 139 137  K  --    < > 3.5   < > 3.2* 3.4* 3.3* 3.2* 3.3*  CL  --    < > 108   < > 116* 110 111 106 103  CO2  --    < > 16*   < > 20* 19* 20* 22 24  GLUCOSE  --    < >  136*   < > 172* 133* 153* 135* 95  BUN  --    < > 32*   < > 20 19 18 14 18   CREATININE  --    < > 1.43*  1.39*   < > 1.37* 1.20 1.08 1.05 1.55*  CALCIUM  --    < > 8.9   < > 9.0 9.2 9.0 9.1 9.0  MG 2.0  --  2.0  --   --   --   --   --   --   PHOS 3.5  --  2.1*  --   --   --   --   --   --    < > = values in this interval not displayed.   GFR: Estimated Creatinine Clearance: 58 mL/min (A) (by C-G formula based on SCr of 1.55 mg/dL (H)). Liver Function Tests: Recent Labs  Lab 08/23/17 1407 08/24/17 1223  AST 14* 20  ALT 12* 12*  ALKPHOS 118 83  BILITOT 2.3* 0.9  PROT 8.7* 6.9  ALBUMIN 3.1* 2.5*   Recent Labs  Lab 08/23/17 1823  LIPASE 46   No results for input(s): AMMONIA in the last 168 hours. Coagulation Profile: Recent Labs  Lab 08/23/17 1823  INR 1.20   Cardiac Enzymes: No results for input(s): CKTOTAL, CKMB, CKMBINDEX, TROPONINI in the last 168 hours. BNP (last 3 results) No results for input(s): PROBNP in the last 8760 hours. HbA1C: No results for input(s): HGBA1C in the last 72 hours. CBG: Recent Labs  Lab 08/27/17 0414 08/27/17 0441 08/27/17 0504 08/27/17 0536 08/27/17 0729  GLUCAP 33* 36* 34* 107* 91   Lipid  Profile: No results for input(s): CHOL, HDL, LDLCALC, TRIG, CHOLHDL, LDLDIRECT in the last 72 hours. Thyroid Function Tests: No results for input(s): TSH, T4TOTAL, FREET4, T3FREE, THYROIDAB in the last 72 hours. Anemia Panel: No results for input(s): VITAMINB12, FOLATE, FERRITIN, TIBC, IRON, RETICCTPCT in the last 72 hours. Urine analysis:    Component Value Date/Time   COLORURINE YELLOW (A) 04/29/2017 1907   APPEARANCEUR HAZY (A) 04/29/2017 1907   LABSPEC 1.018 04/29/2017 1907   PHURINE 5.0 04/29/2017 1907   GLUCOSEU NEGATIVE 04/29/2017 1907   HGBUR SMALL (A) 04/29/2017 1907   BILIRUBINUR NEGATIVE 04/29/2017 1907   KETONESUR NEGATIVE 04/29/2017 1907   PROTEINUR >=300 (A) 04/29/2017 1907   UROBILINOGEN 0.2 10/22/2006 0948   NITRITE NEGATIVE 04/29/2017 1907   LEUKOCYTESUR NEGATIVE 04/29/2017 1907   Sepsis Labs: @LABRCNTIP (procalcitonin:4,lacticidven:4)  ) Recent Results (from the past 240 hour(s))  Culture, blood (routine x 2)     Status: None (Preliminary result)   Collection Time: 08/23/17  6:23 PM  Result Value Ref Range Status   Specimen Description BLOOD LEFT ANTECUBITAL  Final   Special Requests   Final    BOTTLES DRAWN AEROBIC AND ANAEROBIC Blood Culture adequate volume   Culture   Final    NO GROWTH 2 DAYS Performed at [facility], [place] [address] [place], [place] [address]    Report Status PENDING  Incomplete  Culture, blood (routine x 2)     Status: None (Preliminary result)   Collection Time: 08/23/17  6:23 PM  Result Value Ref Range Status   Specimen Description BLOOD LEFT HAND  Final   Special Requests   Final    BOTTLES DRAWN AEROBIC ONLY Blood Culture adequate volume   Culture   Final    NO GROWTH 2 DAYS Performed at [facility], [place]  [address] [place], [place] [address]    Report Status PENDING  Incomplete  MRSA PCR Screening     Status: None   Collection Time: 08/25/17  9:00 AM  Result Value Ref Range Status   MRSA by PCR NEGATIVE  NEGATIVE Final    Comment:        The GeneXpert MRSA Assay (FDA approved for NASAL specimens only), is one component of a comprehensive MRSA colonization surveillance program. It is not intended to diagnose MRSA infection nor to guide or monitor treatment for MRSA infections. Performed at [facility], [place] [address], [place], [place] [address]       Radiology Studies: No results found.   Scheduled Meds: . aspirin EC  81 mg Oral Daily  . brimonidine  1 drop Right Eye BID   And  . timolol  1 drop Right Eye BID  . enoxaparin (LOVENOX) injection  40 mg Subcutaneous Q24H  . esomeprazole  40 mg Oral Q1200  . famotidine  20 mg Oral BID  . losartan  100 mg Oral Daily   And  . hydrochlorothiazide  25 mg Oral Daily  . insulin aspart  0-9 Units Subcutaneous Q4H  . insulin glargine  50 Units Subcutaneous Daily  . latanoprost  1 drop Both Eyes QHS  . metoCLOPramide  5 mg Intravenous Q6H  . metoprolol tartrate  100 mg Oral BID  . multivitamin with minerals  1 tablet Oral Daily  . promethazine  25 mg Intravenous Q6H  . rosuvastatin  20 mg Oral q1800  . scopolamine  1 patch Transdermal Q72H   Continuous Infusions: . ondansetron (ZOFRAN) IV Stopped (08/26/17 1338)     LOS: 4 days   Time Spent in minutes   40 minutes (greater than 50% of time spent with patient face to face, as well as reviewing records, formulating a plan and speaking with family via phone)  Cristal Ford D.O. on 08/27/2017 at 10:44 AM  Between 7am to 7pm - Pager - [phone]  After 7pm go to [email or web address] - password [email or web address]  And look for the night coverage person covering for me after hours  [facility] Office  [phone]

## 2017-08-27 NOTE — Progress Notes (Signed)
CBG 34

## 2017-08-27 NOTE — Progress Notes (Signed)
CBG 107 

## 2017-08-27 NOTE — Progress Notes (Addendum)
D50 16ml given IV; will recheck in 15 mins

## 2017-08-27 NOTE — Progress Notes (Signed)
Hypoglycemic event resolved.  CBG 80

## 2017-08-27 NOTE — Progress Notes (Signed)
CBG 36  8oz orange juice given.  Will recheck in 15 minutes

## 2017-08-27 NOTE — Progress Notes (Signed)
Hypoglycemic Event:  CBG 33  8oz Milk given.  Will recheck in 15 minutes

## 2017-08-27 NOTE — Progress Notes (Signed)
Pt showered and dressing to left heel changed after shower.

## 2017-08-27 NOTE — Progress Notes (Signed)
Hypoglycemic Event:  CBG 69  4oz Milk given; will recheck in 15 mins

## 2017-08-28 LAB — GLUCOSE, CAPILLARY
Glucose-Capillary: 47 mg/dL — ABNORMAL LOW (ref 65–99)
Glucose-Capillary: 73 mg/dL (ref 65–99)
Glucose-Capillary: 123 mg/dL — ABNORMAL HIGH (ref 65–99)
Glucose-Capillary: 209 mg/dL — ABNORMAL HIGH (ref 65–99)

## 2017-08-28 LAB — BASIC METABOLIC PANEL
Anion gap: 11 (ref 5–15)
BUN: 21 mg/dL — ABNORMAL HIGH (ref 6–20)
CO2: 24 mmol/L (ref 22–32)
Calcium: 9.1 mg/dL (ref 8.9–10.3)
Chloride: 99 mmol/L — ABNORMAL LOW (ref 101–111)
Creatinine, Ser: 1.51 mg/dL — ABNORMAL HIGH (ref 0.61–1.24)
GFR calc Af Amer: 59 mL/min — ABNORMAL LOW (ref >60)
GFR calc non Af Amer: 51 mL/min — ABNORMAL LOW (ref >60)
Glucose, Bld: 86 mg/dL (ref 65–99)
Potassium: 3.9 mmol/L (ref 3.5–5.1)
Sodium: 134 mmol/L — ABNORMAL LOW (ref 135–145)

## 2017-08-28 LAB — HEMOGLOBIN A1C
Hgb A1c MFr Bld: 15.3 % — ABNORMAL HIGH (ref 4.8–5.6)
Mean Plasma Glucose: 392.41 mg/dL

## 2017-08-28 MED ORDER — INSULIN GLARGINE 100 UNIT/ML ~~LOC~~ SOLN: 50.0000 [IU] | Freq: Every day | SUBCUTANEOUS | Status: DC

## 2017-08-28 MED ORDER — METOCLOPRAMIDE HCL 5 MG PO TABS: 5.0000 mg | ORAL_TABLET | Freq: Three times a day (TID) | ORAL | 0 refills | Status: DC

## 2017-08-28 MED ORDER — INSULIN ASPART 100 UNIT/ML FLEXPEN
PEN_INJECTOR | SUBCUTANEOUS | 0 refills | Status: DC
Start: 2017-08-28 — End: 2017-09-09

## 2017-08-28 NOTE — Progress Notes (Signed)
8oz milk also given per pt request.  CBG 73  Coffee given with splenda and cream per pt request.

## 2017-08-28 NOTE — Discharge Summary (Signed)
Physician Discharge Summary  MARSH HECKLER XVQ:[id number] DOB: 1965-04-21 DOA: 08/23/2017  PCP: [name], MD  Admit date: 08/23/2017 Discharge date: 08/28/2017  Time spent: 45 minutes  Recommendations for Outpatient Follow-up:  Patient will be discharged to home.  Patient will need to follow up with primary care provider within one week of discharge, repeat BMP.  Follow up with the wound care clinic. Patient should continue medications as prescribed.  Patient should follow a heart healthy/carb modified diet.   Discharge Diagnoses:  DKA/uncontrolled diabetes mellitus, type II, complicated by hypoglycemic episodes Nausea and vomiting GERD Chronic left heel wound Essential hypertension Chronic kidney disease, stage III Hyperbilirubinemia Hypokalemia Chronic normocytic anemia  Discharge Condition: Stable  Diet recommendation: heart healthy/carb modified   Filed Weights   08/23/17 1517 08/23/17 1818  Weight: 97.5 kg (215 lb) 84.9 kg (187 lb 2.7 oz)    History of present illness:  On 08/23/2017 by Dr. Verneita Griffes  52 year old male history of cervical spondylosis C5-6 status post surgery, HTN,chronic diarrhea status post cholecystectomy 4/13 followed in [place] Dr. Ulice Brilliant mellitus type 2 for the past 25 years on insulinchronic left foot ulcer that was followed by Dr. Barbaraann [facility] and had an unsuccessful angio done sometime in the last year  -he currently gets his wound debrided by [place] [facility] physician every other week for the last 3 or 4 weeks by Dr. Linton Rump Admit 5/12nausea vomiting--has been having 3 to 4 days of nausea vomiting and has not really eaten in the past couple of days only having soup and liquids When they checked his labs his sugar was reading in the 600 range Wound has been looking clean he has not had any new issues with it was debrided couple of days ago and he has not noticed any oozing-his wife is an Therapist, sports and dress it  regularly for him  Hospital Course:  DKA/uncontrolled diabetes mellitus, type II, complicated by hypoglycemic episodes -Patient initially found to be in DKA and required IV insulin -Transitioned to Lantus as well as insulin sliding scale -Metformin held -Per patient's wife, patient has been noncompliant -despite only giving Lantus in the morning, patient continues to have hypoglycemic episodes in the early morning, which resolves quickly -Will discharge patient with lantus, ISS -will need to follow up with PCP for closer monitoring   Nausea and vomiting -Resolved -Suspect secondary to the above -KUB showed nonspecific diffuse decreased bowel gas and small amount of gas in the transverse colon -Continue antiemetics as needed as well as Reglan (wife worried about the side effects of Reglan, will decrease dose to 5mg ) -Able to tolerate soft diet  GERD -No longer complaining of indigestion.  Continue Nexium  Chronic left heel wound -Patient has been following with wound care as an outpatient in [facility] -CT left foot showed skin ulceration on the heel without CT evidence of underlying osteomyelitis.  Negative for abscess -Patient initially placed on vancomycin and Zosyn however this was discontinued as patient did not appear to have active infection -Wound care consulted -Orthopedic surgery, Dr. Berenice Primas, consulted and appreciated.  Discussed with Dr. Berenice Primas, recommended wound care here in [place].  Feels patient may be a candidate for BKA once his diabetes is well controlled. -Discussed at length with patient, he states he would like to continue wound care in [place] however would like to continue seeing Dr. Linton Rump.  Essential hypertension -Continue hyzaar, metoprolol, PRN lasix -amlodipine held as BP currently stable   Chronic kidney disease, stage III -  Appears to be at baseline  Hyperbilirubinemia -Resolved with IVF  Hypokalemia -Resolved with supplementation,  repeat BMP in 1 week -Magnesium 2  Chronic normocytic anemia -Hemoglobin appears to be stable  Consultants PCCM Orthopedic surgery  Procedures  None  Discharge Exam: Vitals:   08/27/17 1953 08/28/17 0438  BP: 113/74 113/80  Pulse: 76 62  Resp: 20 17  Temp: 98.7 F (37.1 C) 98.1 F (36.7 C)  SpO2: 100% 100%   Patient feels much better today. Denies current chest pain, shortness of breath, abdominal pain, nausea, vomiting, diarrhea or constipation, dizziness or headache.  Feels he is ready to go home.   General: Well developed, well nourished, NAD, appears stated age  HEENT: NCAT, mucous membranes moist.  Neck: Supple  Cardiovascular: S1 S2 auscultated, no rubs, murmurs or gallops. Regular rate and rhythm.  Respiratory: Clear to auscultation bilaterally with equal chest rise  Abdomen: Soft, nontender, nondistended, + bowel sounds  Extremities: warm dry without cyanosis clubbing or edema  Neuro: AAOx3, nonfocal  Psych: Normal affect and demeanor with intact judgement and insight  Discharge Instructions Discharge Instructions    Discharge instructions   Complete by:  As directed    Patient will be discharged to home.  Patient will need to follow up with primary care provider within one week of discharge, repeat BMP.  Follow up with the wound care clinic. Patient should continue medications as prescribed.  Patient should follow a heart healthy/carb modified diet.     Allergies as of 08/28/2017      Reactions   Atorvastatin Other (See Comments)   Myalgias      Medication List    STOP taking these medications   amLODipine 10 MG tablet Commonly known as:  NORVASC   metFORMIN 500 MG tablet Commonly known as:  GLUCOPHAGE     TAKE these medications   aspirin EC 81 MG tablet Take 81 mg by mouth daily.   COMBIGAN 0.2-0.5 % ophthalmic solution Generic drug:  brimonidine-timolol Place 1 drop into the right eye every 12 (twelve) hours.   DEXCOM G6  RECEIVER Devi 1 each by Does not apply route as directed. Use to check blood glucose 5 times daily   DEXCOM G6 SENSOR Misc 1 each by Does not apply route as directed. Place 1 sensor subcutaneously once every 10 days.   DEXCOM G6 TRANSMITTER Misc 1 each by Does not apply route as directed. Use to check blood glucose 5 times daily. Refill every 3 months.   DUREZOL 0.05 % Emul Generic drug:  Difluprednate APPLY 1 DROP(S) IN LEFT EYE qhs   esomeprazole 40 MG capsule Commonly known as:  NEXIUM Take 1 capsule (40 mg total) by mouth daily.   furosemide 40 MG tablet Commonly known as:  LASIX Take 40 mg by mouth as needed for fluid or edema.   gabapentin 300 MG capsule Commonly known as:  NEURONTIN Take 2 capsules (600 mg total) by mouth 2 (two) times daily.   insulin aspart 100 UNIT/ML FlexPen Commonly known as:  NOVOLOG FLEXPEN CBG121-150: 1u; CBG151-200: 2u; CBG201-250: 3u; CBG251-300: 5u; CBG301-350: 7u; CBG351-400: 9u What changed:  additional instructions   insulin glargine 100 UNIT/ML injection Commonly known as:  LANTUS Inject 0.5 mLs (50 Units total) into the skin daily. What changed:  how much to take   latanoprost 0.005 % ophthalmic solution Commonly known as:  XALATAN Place 1 drop into both eyes at bedtime.   losartan-hydrochlorothiazide 100-25 MG tablet Commonly known as:  HYZAAR Take  1 tablet by mouth daily.   metoCLOPramide 5 MG tablet Commonly known as:  REGLAN Take 1 tablet (5 mg total) by mouth 4 (four) times daily -  before meals and at bedtime.   metoprolol tartrate 100 MG tablet Commonly known as:  LOPRESSOR Take 1 tablet (100 mg total) by mouth 2 (two) times daily.   multivitamin with minerals Tabs tablet Take 1 tablet by mouth daily.   Oxycodone HCl 10 MG Tabs Take 1 tablet (10 mg total) by mouth every 4 (four) hours as needed for severe pain ((score 7 to 10)).   Pen Needles 32G X 4 MM Misc Inject 90 each 3 (three) times daily into the skin.     rosuvastatin 20 MG tablet Commonly known as:  CRESTOR Take 1 tablet (20 mg total) daily by mouth.      Allergies  Allergen Reactions  . Atorvastatin Other (See Comments)    Myalgias    Follow-up Information    [place]             . Call.   Why:  CAll and make a hopsital f/u appointment upon daay of discharge. Contact information: [address] [phone]       [name], MD. Schedule an appointment as soon as possible for a visit in 1 week(s).   Specialty:  Family Medicine Why:  Hospital follow up Contact information: [address] [place] [address] [phone]        Dorna Leitz, MD. Call.   Specialty:  Orthopedic Surgery Why:  As needed for Hospital follow up Contact information: [place] [address] [phone]            The results of significant diagnostics from this hospitalization (including imaging, microbiology, ancillary and laboratory) are listed below for reference.    Significant Diagnostic Studies: Dg Abd 1 View  Result Date: 08/24/2017 CLINICAL DATA:  Nausea and vomiting EXAM: ABDOMEN - 1 VIEW COMPARISON:  None. FINDINGS: Nonspecific decreased bowel gas. Small amount of gas in the transverse colon. Small amount of stool in the rectum. IMPRESSION: Nonspecific diffuse decreased bowel gas with small amount of gas in the transverse colon. Electronically Signed   By: Donavan Foil M.D.   On: 08/24/2017 00:37   Ct Foot Left Wo Contrast  Result Date: 08/23/2017 CLINICAL DATA:  Diabetic patient with an ulceration on the left heel. EXAM: CT OF THE LEFT FOOT WITHOUT CONTRAST TECHNIQUE: Multidetector CT imaging of the left foot was performed according to the standard protocol. Multiplanar CT image reconstructions were also generated. COMPARISON:  Plain films of the left foot earlier today and 07/10/2016. MRI left ankle  05/20/2016. FINDINGS: Bones/Joint/Cartilage The patient has a remote healed fracture of the posterior, superior calcaneus. Irregularity of cortical bone along the inferior margin of the calcaneus is noted but no cortical destructive change or periosteal reaction is identified. There is some sclerosis of the calcaneus eccentric toward the medial side. No fracture or dislocation. Ligaments Suboptimally assessed by CT. Muscles and Tendons Intact. Soft tissues Large skin ulceration over the heel is identified. No underlying fluid collection is present. No radiopaque foreign body or soft tissue gas. Subcutaneous edema about the ankle is noted. Atherosclerosis is noted. IMPRESSION: Skin ulceration on the heel without CT evidence of underlying osteomyelitis. Negative for abscess. Healed fracture of dorsal calcaneus. Atherosclerosis. Electronically Signed   By: Inge Rise M.D.  On: 08/23/2017 17:20   Dg Foot 2 Views Left  Result Date: 08/23/2017 CLINICAL DATA:  Diabetic patient with a nonhealing wound on the left heel. The wound is chronic. EXAM: LEFT FOOT - 2 VIEW COMPARISON:  Plain films of the left foot 07/10/2016. FINDINGS: There is a large skin wound on the plantar surface of the heel. Previously seen fracture of the dorsal calcaneus has healed. There is sclerosis in the calcaneus and irregularity of cortical bone deep to the patient's skin ulceration. No radiopaque foreign body. Atherosclerosis noted. 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 Healed calcaneal fracture. Electronically Signed   By: Inge Rise M.D.   On: 08/23/2017 14:35    Microbiology: Recent Results (from the past 240 hour(s))  Culture, blood (routine x 2)     Status: None (Preliminary result)   Collection Time: 08/23/17  6:23 PM  Result Value Ref Range Status   Specimen Description BLOOD LEFT ANTECUBITAL  Final   Special Requests   Final     BOTTLES DRAWN AEROBIC AND ANAEROBIC Blood Culture adequate volume   Culture   Final    NO GROWTH 4 DAYS Performed at [facility], [address], [place], [place] [address]    Report Status PENDING  Incomplete  Culture, blood (routine x 2)     Status: None (Preliminary result)   Collection Time: 08/23/17  6:23 PM  Result Value Ref Range Status   Specimen Description BLOOD LEFT HAND  Final   Special Requests   Final    BOTTLES DRAWN AEROBIC ONLY Blood Culture adequate volume   Culture   Final    NO GROWTH 4 DAYS Performed at [facility], [place] [address], [place], [place] [address]    Report Status PENDING  Incomplete  MRSA PCR Screening     Status: None   Collection Time: 08/25/17  9:00 AM  Result Value Ref Range Status   MRSA by PCR NEGATIVE NEGATIVE Final    Comment:        The GeneXpert MRSA Assay (FDA approved for NASAL specimens only), is one component of a comprehensive MRSA colonization surveillance program. It is not intended to diagnose MRSA infection nor to guide or monitor treatment for MRSA infections. Performed at [facility], [place] [address], [place], [place] [address]      Labs: Basic Metabolic Panel: Recent Labs  Lab 08/23/17 1823  08/24/17 3875  08/25/17 0452 08/25/17 0807 08/26/17 0543 08/27/17 0608 08/28/17 0605  NA  --    < > 140   < > 140 142 139 137 134*  K  --    < > 3.5   < > 3.4* 3.3* 3.2* 3.3* 3.9  CL  --    < > 108   < > 110 111 106 103 99*  CO2  --    < > 16*   < > 19* 20* 22 24 24   GLUCOSE  --    < > 136*   < > 133* 153* 135* 95 86  BUN  --    < > 32*   < > 19 18 14 18  21*  CREATININE  --    < > 1.43*  1.39*   < > 1.20 1.08 1.05 1.55* 1.51*  CALCIUM  --    < > 8.9   < > 9.2 9.0 9.1 9.0 9.1  MG 2.0  --  2.0  --   --   --   --  2.0  --   PHOS 3.5  --  2.1*  --   --   --   --   --   --    < > = values in this interval not displayed.   Liver Function Tests: Recent Labs  Lab 08/23/17 1407  08/24/17 1223  AST 14* 20  ALT 12* 12*  ALKPHOS 118 83  BILITOT 2.3* 0.9  PROT 8.7* 6.9  ALBUMIN 3.1* 2.5*   Recent Labs  Lab 08/23/17 1823  LIPASE 46   No results for input(s): AMMONIA in the last 168 hours. CBC: Recent Labs  Lab 08/23/17 1407 08/24/17 0439 08/25/17 0452 08/26/17 0543 08/27/17 0608  WBC 11.8* 10.2 9.9 7.5 10.3  NEUTROABS 10.1*  --   --   --   --   HGB 12.0* 9.2* 9.9* 9.6* 9.8*  HCT 36.0* 27.3* 29.3* 28.6* 30.0*  MCV 87.8 83.0 84.2 84.6 85.5  PLT 400 291 327 261 268   Cardiac Enzymes: No results for input(s): CKTOTAL, CKMB, CKMBINDEX, TROPONINI in the last 168 hours. BNP: BNP (last 3 results) No results for input(s): BNP in the last 8760 hours.  ProBNP (last 3 results) No results for input(s): PROBNP in the last 8760 hours.  CBG: Recent Labs  Lab 08/27/17 1954 08/27/17 2329 08/28/17 0436 08/28/17 0506 08/28/17 0743  GLUCAP 290* 152* 47* 73 123*       Signed:  [name] Mikhail  [facility] 08/28/2017, 10:44 AM

## 2017-08-28 NOTE — Discharge Instructions (Signed)
Diabetic Ketoacidosis °Diabetic ketoacidosis is a life-threatening complication of diabetes. If it is not treated, it can cause severe dehydration and organ damage and can lead to a coma or death. °What are the causes? °This condition develops when there is not enough of the hormone insulin in the body. Insulin helps the body to break down sugar for energy. Without insulin, the body cannot break down sugar, so it breaks down fats instead. This leads to the production of acids that are called ketones. Ketones are poisonous at high levels. °This condition can be triggered by: °· Stress on the body that is brought on by an illness. °· Medicines that raise blood glucose levels. °· Not taking diabetes medicine. ° °What are the signs or symptoms? °Symptoms of this condition include: °· Fatigue. °· Weight loss. °· Excessive thirst. °· Light-headedness. °· Fruity or sweet-smelling breath. °· Excessive urination. °· Vision changes. °· Confusion or irritability. °· Nausea. °· Vomiting. °· Rapid breathing. °· Abdominal pain. °· Feeling flushed. ° °How is this diagnosed? °This condition is diagnosed based on a medical history, a physical exam, and blood tests. You may also have a urine test that checks for ketones. °How is this treated? °This condition may be treated with: °· Fluid replacement. This may be done to correct dehydration. °· Insulin injections. These may be given through the skin or through an IV tube. °· Electrolyte replacement. Electrolytes, such as potassium and sodium, may be given in pill form or through an IV tube. °· Antibiotic medicines. These may be prescribed if your condition was caused by an infection. ° °Follow these instructions at home: °Eating and drinking °· Drink enough fluids to keep your urine clear or pale yellow. °· If you cannot eat, alternate between drinking fluids with sugar (such as juice) and salty fluids (such as broth or bouillon). °· If you can eat, follow your usual diet and drink  sugar-free liquids, such as water. °Other Instructions ° °· Take insulin as directed by your health care provider. Do not skip insulin injections. Do not use expired insulin. °· If your blood sugar is over 240 mg/dL, monitor your urine ketones every 4-6 hours. °· If you were prescribed an antibiotic medicine, finish all of it even if you start to feel better. °· Rest and exercise only as directed by your health care provider. °· If you get sick, call your health care provider and begin treatment quickly. Your body often needs extra insulin to fight an illness. °· Check your blood glucose levels regularly. If your blood glucose is high, drink plenty of fluids. This helps to flush out ketones. °Contact a health care provider if: °· Your blood glucose level is too high or too low. °· You have ketones in your urine. °· You have a fever. °· You cannot eat. °· You cannot tolerate fluids. °· You have been vomiting for more than 2 hours. °· You continue to have symptoms of this condition. °· You develop new symptoms. °Get help right away if: °· Your blood glucose levels continue to be high (elevated). °· Your monitor reads “high” even when you are taking insulin. °· You faint. °· You have chest pain. °· You have trouble breathing. °· You have a sudden, severe headache. °· You have sudden weakness in one arm or one leg. °· You have sudden trouble speaking or swallowing. °· You have vomiting or diarrhea that gets worse after 3 hours. °· You feel severely fatigued. °· You have trouble thinking. °· You   have abdominal pain.  You are severely dehydrated. Symptoms of severe dehydration include: ? Extreme thirst. ? Dry mouth. ? Blue lips. ? Cold hands and feet. ? Rapid breathing. This information is not intended to replace advice given to you by your health care provider. Make sure you discuss any questions you have with your health care provider. Document Released: 03/28/2000 Document Revised: 09/06/2015 Document  Reviewed: 03/08/2014 Elsevier Interactive Patient Education  2017 [employer] Cannabinoid Hyperemesis Syndrome Cannabinoid hyperemesis syndrome (CHS) is a condition that causes repeated nausea, vomiting, and abdominal pain after long-term (chronic) use of marijuana (cannabis). People with CHS typically use marijuana 3-5 times a day for many years before they have symptoms, although it is possible to develop CHS with as little as 1 use per day. Symptoms of CHS may be mild at first but can get worse and more frequent. In some cases, CHS may cause vomiting many times a day, which can lead to weight loss and dehydration. CHS may go away and come back many times (recur). People may not have symptoms or may otherwise be healthy in between [facility] attacks. What are the causes? The exact cause of this condition is not known. Long-term use of marijuana may over-stimulate certain proteins in the brain that react with chemicals in marijuana (cannabinoid receptors). This over-stimulation may cause CHS. What are the signs or symptoms? Symptoms of this condition are often mild during the first few attacks, but they can get worse over time. Symptoms may include:  Frequent nausea, especially early in the morning.  Vomiting.  Abdominal pain.  Taking several hot showers throughout the day can also be a sign of this condition. People with CHS may do this because it relieves symptoms. How is this diagnosed? This condition may be diagnosed based on:  Your symptoms and medical history, including any drug use.  A physical exam.  You may have tests done to rule out other problems. These tests may include:  Blood tests.  Urine tests.  Imaging tests, such as an X-ray or CT scan.  How is this treated? Treatment for this condition involves stopping marijuana use. Your health care provider may recommend:  A drug rehabilitation program, if you have trouble stopping marijuana use.  Medicines for nausea.  Hot  showers to help relieve symptoms.  Certain creams that contain a substance called capsaicin may improve symptoms when applied to the abdomen. Ask your health care provider before starting any medicines or other treatments. Severe nausea and vomiting may require you to stay at the hospital. You may need IV fluids to prevent or treat dehydration. You may also need certain medicines that must be given at the hospital. Follow these instructions at home: During an attack  Stay in bed and rest in a dark, quiet room.  Take anti-nausea medicine as told by your health care provider.  Try taking hot showers to relieve your symptoms. After an attack  Drink small amounts of clear fluids slowly. Gradually add more.  Once you are able to eat without vomiting, eat soft foods in small amounts every 3-4 hours. General instructions  Do not use any products that contain marijuana.If you need help quitting, ask your health care provider for resources and treatment options.  Drink enough fluid to keep your urine pale yellow. Avoid drinking fluids that have a lot of sugar or caffeine, such as coffee and soda.  Take and apply over-the-counter and prescription medicines only as told by your health care provider. Ask your health  care provider before starting any new medicines or treatments.  Keep all follow-up visits as told by your health care provider. This is important. Contact a health care provider if:  Your symptoms get worse.  You cannot drink fluids without vomiting.  You have pain and trouble swallowing after an attack. Get help right away if:  You cannot stop vomiting.  You have blood in your vomit or your vomit looks like coffee grounds.  You have severe abdominal pain.  You have stools that are bloody or black, or stools that look like tar.  You have symptoms of dehydration, such as: ? Sunken eyes. ? Inability to make tears. ? Cracked lips. ? Dry mouth. ? Decreased urine  production. ? Weakness. ? Sleepiness. ? Fainting. Summary  Cannabinoid hyperemesis syndrome (CHS) is a condition that causes repeated nausea, vomiting, and abdominal pain after long-term use of marijuana.  People with CHS typically use marijuana 3-5 times a day for many years before they have symptoms, although it is possible to develop CHS with as little as 1 use per day.  Treatment for this condition involves stopping marijuana use. Hot showers and capsaicin creams may also help relieve symptoms. Ask your health care provider before starting any medicines or other treatments.  Your health care provider may prescribe medicines to help with nausea.  Get help right away if you have signs of dehydration, such as dry mouth, decreased urine production, or weakness. This information is not intended to replace advice given to you by your health care provider. Make sure you discuss any questions you have with your health care provider. Document Released: 07/09/2016 Document Revised: 07/09/2016 Document Reviewed: 07/09/2016 Elsevier Interactive Patient Education  [employer].

## 2017-08-28 NOTE — Progress Notes (Signed)
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  Discussed prescriptions and follow up appointments with the patient. Prescriptions given to patient, medication list explained in detail. Pt verbalized understanding.  Allergies as of 08/28/2017      Reactions   Atorvastatin Other (See Comments)   Myalgias      Medication List    STOP taking these medications   amLODipine 10 MG tablet Commonly known as:  NORVASC   metFORMIN 500 MG tablet Commonly known as:  GLUCOPHAGE     TAKE these medications   aspirin EC 81 MG tablet Take 81 mg by mouth daily.   COMBIGAN 0.2-0.5 % ophthalmic solution Generic drug:  brimonidine-timolol Place 1 drop into the right eye every 12 (twelve) hours.   DEXCOM G6 RECEIVER Devi 1 each by Does not apply route as directed. Use to check blood glucose 5 times daily   DEXCOM G6 SENSOR Misc 1 each by Does not apply route as directed. Place 1 sensor subcutaneously once every 10 days.   DEXCOM G6 TRANSMITTER Misc 1 each by Does not apply route as directed. Use to check blood glucose 5 times daily. Refill every 3 months.   DUREZOL 0.05 % Emul Generic drug:  Difluprednate APPLY 1 DROP(S) IN LEFT EYE qhs   esomeprazole 40 MG capsule Commonly known as:  NEXIUM Take 1 capsule (40 mg total) by mouth daily.   furosemide 40 MG tablet Commonly known as:  LASIX Take 40 mg by mouth as needed for fluid or edema.   gabapentin 300 MG capsule Commonly known as:  NEURONTIN Take 2 capsules (600 mg total) by mouth 2 (two) times daily.   insulin aspart 100 UNIT/ML FlexPen Commonly known as:  NOVOLOG FLEXPEN CBG121-150: 1u; CBG151-200: 2u; CBG201-250: 3u; CBG251-300: 5u; CBG301-350: 7u; CBG351-400: 9u What changed:  additional instructions   insulin glargine 100 UNIT/ML injection Commonly known as:  LANTUS Inject 0.5 mLs (50 Units total) into the skin daily. What changed:  how much to take   latanoprost 0.005 % ophthalmic solution Commonly known as:   XALATAN Place 1 drop into both eyes at bedtime.   losartan-hydrochlorothiazide 100-25 MG tablet Commonly known as:  HYZAAR Take 1 tablet by mouth daily.   metoCLOPramide 5 MG tablet Commonly known as:  REGLAN Take 1 tablet (5 mg total) by mouth 4 (four) times daily -  before meals and at bedtime.   metoprolol tartrate 100 MG tablet Commonly known as:  LOPRESSOR Take 1 tablet (100 mg total) by mouth 2 (two) times daily.   multivitamin with minerals Tabs tablet Take 1 tablet by mouth daily.   Oxycodone HCl 10 MG Tabs Take 1 tablet (10 mg total) by mouth every 4 (four) hours as needed for severe pain ((score 7 to 10)).   Pen Needles 32G X 4 MM Misc Inject 90 each 3 (three) times daily into the skin.   rosuvastatin 20 MG tablet Commonly known as:  CRESTOR Take 1 tablet (20 mg total) daily by mouth.       Vitals:   08/27/17 1953 08/28/17 0438  BP: 113/74 113/80  Pulse: 76 62  Resp: 20 17  Temp: 98.7 F (37.1 C) 98.1 F (36.7 C)  SpO2: 100% 100%    Skin clean, dry and intact without evidence of skin break down, no evidence of skin tears noted. IV catheter discontinued intact. Site without signs and symptoms of complications. Dressing and pressure applied. Pt denies pain at this time. No complaints noted.  An After Visit Summary was printed and given to the patient. Patient escorted via [place], and D/C home via private auto.  Chase Clark 08/28/2017 4:26 PM

## 2017-08-28 NOTE — Progress Notes (Signed)
Hypoglycemic Event:  CBG 47  8oz OJ given; will recheck in 15 mins

## 2017-08-29 LAB — CULTURE, BLOOD (ROUTINE X 2)
Culture: NO GROWTH
Culture: NO GROWTH
Special Requests: ADEQUATE
Special Requests: ADEQUATE

## 2017-08-31 ENCOUNTER — Telehealth: Payer: Self-pay

## 2017-08-31 NOTE — Telephone Encounter (Signed)
Copied from [place] [id number]. Topic: General - Other >> Aug 31, 2017  4:32 PM Yvette Rack wrote: Reason for CRM: Pt wife [name] states she needs to discuss information regarding pt insulin pump. Pt wife requested call back [phone].

## 2017-09-01 NOTE — Telephone Encounter (Signed)
° °  Pt wife [name] said she will send information through [employer] what she want to discuss

## 2017-09-01 NOTE — Telephone Encounter (Signed)
Left message to return call, ok for pec to speak to patients wife and get more details.

## 2017-09-01 NOTE — Telephone Encounter (Signed)
noted 

## 2017-09-02 ENCOUNTER — Encounter: Payer: Self-pay | Admitting: Family Medicine

## 2017-09-02 ENCOUNTER — Telehealth: Payer: Self-pay

## 2017-09-02 NOTE — Telephone Encounter (Signed)
Copied from [place] [id number]. Topic: General - Other >> Sep 02, 2017  3:55 PM Carolyn Stare wrote:  Pt wife said she need Janett Billow to call her back today  [phone]

## 2017-09-04 ENCOUNTER — Encounter (HOSPITAL_COMMUNITY): Payer: Self-pay | Admitting: Pharmacy Technician

## 2017-09-04 ENCOUNTER — Emergency Department (HOSPITAL_COMMUNITY): Payer: BLUE CROSS/BLUE SHIELD

## 2017-09-04 ENCOUNTER — Inpatient Hospital Stay (HOSPITAL_COMMUNITY)
Admission: EM | Admit: 2017-09-04 | Discharge: 2017-09-09 | DRG: 854 | Disposition: A | Discharge status: Rehabilitation Facility | Payer: BLUE CROSS/BLUE SHIELD | Attending: Internal Medicine | Admitting: Internal Medicine

## 2017-09-04 DIAGNOSIS — E1165 Type 2 diabetes mellitus with hyperglycemia: Secondary | ICD-10-CM | POA: Diagnosis not present

## 2017-09-04 DIAGNOSIS — E872 Acidosis, unspecified: Secondary | ICD-10-CM

## 2017-09-04 DIAGNOSIS — E1169 Type 2 diabetes mellitus with other specified complication: Secondary | ICD-10-CM | POA: Diagnosis not present

## 2017-09-04 DIAGNOSIS — E1139 Type 2 diabetes mellitus with other diabetic ophthalmic complication: Secondary | ICD-10-CM

## 2017-09-04 DIAGNOSIS — Z87891 Personal history of nicotine dependence: Secondary | ICD-10-CM | POA: Diagnosis not present

## 2017-09-04 DIAGNOSIS — K219 Gastro-esophageal reflux disease without esophagitis: Secondary | ICD-10-CM | POA: Diagnosis not present

## 2017-09-04 DIAGNOSIS — K5900 Constipation, unspecified: Secondary | ICD-10-CM | POA: Diagnosis not present

## 2017-09-04 DIAGNOSIS — Z794 Long term (current) use of insulin: Secondary | ICD-10-CM

## 2017-09-04 DIAGNOSIS — M7989 Other specified soft tissue disorders: Secondary | ICD-10-CM | POA: Diagnosis not present

## 2017-09-04 DIAGNOSIS — M869 Osteomyelitis, unspecified: Secondary | ICD-10-CM | POA: Diagnosis not present

## 2017-09-04 DIAGNOSIS — E11621 Type 2 diabetes mellitus with foot ulcer: Secondary | ICD-10-CM | POA: Diagnosis present

## 2017-09-04 DIAGNOSIS — Z955 Presence of coronary angioplasty implant and graft: Secondary | ICD-10-CM

## 2017-09-04 DIAGNOSIS — I10 Essential (primary) hypertension: Secondary | ICD-10-CM | POA: Diagnosis not present

## 2017-09-04 DIAGNOSIS — E785 Hyperlipidemia, unspecified: Secondary | ICD-10-CM | POA: Diagnosis not present

## 2017-09-04 DIAGNOSIS — I129 Hypertensive chronic kidney disease with stage 1 through stage 4 chronic kidney disease, or unspecified chronic kidney disease: Secondary | ICD-10-CM | POA: Diagnosis present

## 2017-09-04 DIAGNOSIS — S8992XA Unspecified injury of left lower leg, initial encounter: Secondary | ICD-10-CM | POA: Diagnosis not present

## 2017-09-04 DIAGNOSIS — R338 Other retention of urine: Secondary | ICD-10-CM | POA: Diagnosis not present

## 2017-09-04 DIAGNOSIS — L97429 Non-pressure chronic ulcer of left heel and midfoot with unspecified severity: Secondary | ICD-10-CM | POA: Diagnosis not present

## 2017-09-04 DIAGNOSIS — Z981 Arthrodesis status: Secondary | ICD-10-CM

## 2017-09-04 DIAGNOSIS — N320 Bladder-neck obstruction: Secondary | ICD-10-CM | POA: Diagnosis present

## 2017-09-04 DIAGNOSIS — D72829 Elevated white blood cell count, unspecified: Secondary | ICD-10-CM | POA: Diagnosis not present

## 2017-09-04 DIAGNOSIS — Z4781 Encounter for orthopedic aftercare following surgical amputation: Secondary | ICD-10-CM | POA: Diagnosis not present

## 2017-09-04 DIAGNOSIS — E1151 Type 2 diabetes mellitus with diabetic peripheral angiopathy without gangrene: Secondary | ICD-10-CM | POA: Diagnosis not present

## 2017-09-04 DIAGNOSIS — G8918 Other acute postprocedural pain: Secondary | ICD-10-CM | POA: Diagnosis not present

## 2017-09-04 DIAGNOSIS — E1122 Type 2 diabetes mellitus with diabetic chronic kidney disease: Secondary | ICD-10-CM | POA: Diagnosis not present

## 2017-09-04 DIAGNOSIS — E119 Type 2 diabetes mellitus without complications: Secondary | ICD-10-CM

## 2017-09-04 DIAGNOSIS — A419 Sepsis, unspecified organism: Principal | ICD-10-CM | POA: Diagnosis present

## 2017-09-04 DIAGNOSIS — D638 Anemia in other chronic diseases classified elsewhere: Secondary | ICD-10-CM | POA: Diagnosis not present

## 2017-09-04 DIAGNOSIS — N401 Enlarged prostate with lower urinary tract symptoms: Secondary | ICD-10-CM | POA: Diagnosis not present

## 2017-09-04 DIAGNOSIS — R0602 Shortness of breath: Secondary | ICD-10-CM

## 2017-09-04 DIAGNOSIS — R6521 Severe sepsis with septic shock: Secondary | ICD-10-CM | POA: Diagnosis not present

## 2017-09-04 DIAGNOSIS — R7309 Other abnormal glucose: Secondary | ICD-10-CM | POA: Diagnosis not present

## 2017-09-04 DIAGNOSIS — I2510 Atherosclerotic heart disease of native coronary artery without angina pectoris: Secondary | ICD-10-CM | POA: Diagnosis not present

## 2017-09-04 DIAGNOSIS — N179 Acute kidney failure, unspecified: Secondary | ICD-10-CM | POA: Diagnosis not present

## 2017-09-04 DIAGNOSIS — R031 Nonspecific low blood-pressure reading: Secondary | ICD-10-CM | POA: Diagnosis not present

## 2017-09-04 DIAGNOSIS — M728 Other fibroblastic disorders: Secondary | ICD-10-CM | POA: Diagnosis not present

## 2017-09-04 DIAGNOSIS — L089 Local infection of the skin and subcutaneous tissue, unspecified: Secondary | ICD-10-CM | POA: Diagnosis present

## 2017-09-04 DIAGNOSIS — R0989 Other specified symptoms and signs involving the circulatory and respiratory systems: Secondary | ICD-10-CM | POA: Diagnosis not present

## 2017-09-04 DIAGNOSIS — M86672 Other chronic osteomyelitis, left ankle and foot: Secondary | ICD-10-CM | POA: Diagnosis present

## 2017-09-04 DIAGNOSIS — N183 Chronic kidney disease, stage 3 (moderate): Secondary | ICD-10-CM | POA: Diagnosis not present

## 2017-09-04 DIAGNOSIS — E1142 Type 2 diabetes mellitus with diabetic polyneuropathy: Secondary | ICD-10-CM | POA: Diagnosis not present

## 2017-09-04 DIAGNOSIS — H409 Unspecified glaucoma: Secondary | ICD-10-CM | POA: Diagnosis not present

## 2017-09-04 DIAGNOSIS — D649 Anemia, unspecified: Secondary | ICD-10-CM | POA: Diagnosis not present

## 2017-09-04 DIAGNOSIS — I70244 Atherosclerosis of native arteries of left leg with ulceration of heel and midfoot: Secondary | ICD-10-CM | POA: Diagnosis not present

## 2017-09-04 DIAGNOSIS — E162 Hypoglycemia, unspecified: Secondary | ICD-10-CM | POA: Diagnosis not present

## 2017-09-04 DIAGNOSIS — Z79899 Other long term (current) drug therapy: Secondary | ICD-10-CM

## 2017-09-04 DIAGNOSIS — D62 Acute posthemorrhagic anemia: Secondary | ICD-10-CM

## 2017-09-04 DIAGNOSIS — M792 Neuralgia and neuritis, unspecified: Secondary | ICD-10-CM

## 2017-09-04 DIAGNOSIS — N400 Enlarged prostate without lower urinary tract symptoms: Secondary | ICD-10-CM

## 2017-09-04 DIAGNOSIS — K760 Fatty (change of) liver, not elsewhere classified: Secondary | ICD-10-CM | POA: Diagnosis present

## 2017-09-04 DIAGNOSIS — Z833 Family history of diabetes mellitus: Secondary | ICD-10-CM | POA: Diagnosis not present

## 2017-09-04 DIAGNOSIS — Z89512 Acquired absence of left leg below knee: Secondary | ICD-10-CM | POA: Diagnosis not present

## 2017-09-04 DIAGNOSIS — S3992XA Unspecified injury of lower back, initial encounter: Secondary | ICD-10-CM | POA: Diagnosis not present

## 2017-09-04 DIAGNOSIS — S88112A Complete traumatic amputation at level between knee and ankle, left lower leg, initial encounter: Secondary | ICD-10-CM | POA: Diagnosis not present

## 2017-09-04 DIAGNOSIS — N189 Chronic kidney disease, unspecified: Secondary | ICD-10-CM | POA: Diagnosis not present

## 2017-09-04 DIAGNOSIS — IMO0002 Reserved for concepts with insufficient information to code with codable children: Secondary | ICD-10-CM

## 2017-09-04 DIAGNOSIS — N184 Chronic kidney disease, stage 4 (severe): Secondary | ICD-10-CM

## 2017-09-04 DIAGNOSIS — S3993XA Unspecified injury of pelvis, initial encounter: Secondary | ICD-10-CM | POA: Diagnosis not present

## 2017-09-04 DIAGNOSIS — R339 Retention of urine, unspecified: Secondary | ICD-10-CM | POA: Diagnosis not present

## 2017-09-04 DIAGNOSIS — Z7982 Long term (current) use of aspirin: Secondary | ICD-10-CM

## 2017-09-04 LAB — COMPREHENSIVE METABOLIC PANEL
ALT: 8 U/L — ABNORMAL LOW (ref 17–63)
AST: 18 U/L (ref 15–41)
Albumin: 1.4 g/dL — ABNORMAL LOW (ref 3.5–5.0)
Alkaline Phosphatase: 92 U/L (ref 38–126)
Anion gap: 13 (ref 5–15)
BUN: 23 mg/dL — ABNORMAL HIGH (ref 6–20)
CO2: 27 mmol/L (ref 22–32)
Calcium: 8.3 mg/dL — ABNORMAL LOW (ref 8.9–10.3)
Chloride: 90 mmol/L — ABNORMAL LOW (ref 101–111)
Creatinine, Ser: 2.22 mg/dL — ABNORMAL HIGH (ref 0.61–1.24)
GFR calc Af Amer: 37 mL/min — ABNORMAL LOW (ref >60)
GFR calc non Af Amer: 32 mL/min — ABNORMAL LOW (ref >60)
Glucose, Bld: 258 mg/dL — ABNORMAL HIGH (ref 65–99)
Potassium: 3.7 mmol/L (ref 3.5–5.1)
Sodium: 130 mmol/L — ABNORMAL LOW (ref 135–145)
Total Bilirubin: 0.7 mg/dL (ref 0.3–1.2)
Total Protein: 5.7 g/dL — ABNORMAL LOW (ref 6.5–8.1)

## 2017-09-04 LAB — CBC WITH DIFFERENTIAL/PLATELET
Abs Immature Granulocytes: 0.2 10*3/uL — ABNORMAL HIGH (ref 0.0–0.1)
Basophils Absolute: 0 10*3/uL (ref 0.0–0.1)
Basophils Relative: 0 %
Eosinophils Absolute: 0 10*3/uL (ref 0.0–0.7)
Eosinophils Relative: 0 %
HCT: 18.7 % — ABNORMAL LOW (ref 39.0–52.0)
Hemoglobin: 6.1 g/dL — CL (ref 13.0–17.0)
Immature Granulocytes: 1 %
Lymphocytes Relative: 7 %
Lymphs Abs: 1.2 10*3/uL (ref 0.7–4.0)
MCH: 27.5 pg (ref 26.0–34.0)
MCHC: 32.6 g/dL (ref 30.0–36.0)
MCV: 84.2 fL (ref 78.0–100.0)
Monocytes Absolute: 0.3 10*3/uL (ref 0.1–1.0)
Monocytes Relative: 2 %
Neutro Abs: 14.9 10*3/uL — ABNORMAL HIGH (ref 1.7–7.7)
Neutrophils Relative %: 90 %
Platelets: 299 10*3/uL (ref 150–400)
RBC: 2.22 MIL/uL — ABNORMAL LOW (ref 4.22–5.81)
RDW: 12.7 % (ref 11.5–15.5)
WBC: 16.7 10*3/uL — ABNORMAL HIGH (ref 4.0–10.5)

## 2017-09-04 LAB — I-STAT CG4 LACTIC ACID, ED: Lactic Acid, Venous: 4.44 mmol/L (ref 0.5–1.9)

## 2017-09-04 LAB — POC OCCULT BLOOD, ED: Fecal Occult Bld: NEGATIVE

## 2017-09-04 LAB — SEDIMENTATION RATE: Sed Rate: >140 mm/hr — ABNORMAL HIGH (ref 0–16)

## 2017-09-04 LAB — I-STAT VENOUS BLOOD GAS, ED
Acid-Base Excess: 3 mmol/L — ABNORMAL HIGH (ref 0.0–2.0)
Bicarbonate: 29.5 mmol/L — ABNORMAL HIGH (ref 20.0–28.0)
O2 Saturation: 34 %
TCO2: 31 mmol/L (ref 22–32)
pCO2, Ven: 55.3 mmHg (ref 44.0–60.0)
pH, Ven: 7.334 (ref 7.250–7.430)
pO2, Ven: 23 mmHg — CL (ref 32.0–45.0)

## 2017-09-04 LAB — CBG MONITORING, ED: Glucose-Capillary: 259 mg/dL — ABNORMAL HIGH (ref 65–99)

## 2017-09-04 LAB — PREPARE RBC (CROSSMATCH)

## 2017-09-04 MED ORDER — INSULIN ASPART 100 UNIT/ML ~~LOC~~ SOLN
0.0000 [IU] | SUBCUTANEOUS | Status: DC
  Administered 2017-09-05 (×2): 2 [IU] via SUBCUTANEOUS
  Administered 2017-09-06: 3 [IU] via SUBCUTANEOUS
  Administered 2017-09-06: 2 [IU] via SUBCUTANEOUS
  Administered 2017-09-06: 3 [IU] via SUBCUTANEOUS
  Administered 2017-09-06: 5 [IU] via SUBCUTANEOUS
  Administered 2017-09-06: 3 [IU] via SUBCUTANEOUS
  Administered 2017-09-06: 5 [IU] via SUBCUTANEOUS
  Administered 2017-09-07: 2 [IU] via SUBCUTANEOUS
  Administered 2017-09-07: 11 [IU] via SUBCUTANEOUS
  Administered 2017-09-07: 3 [IU] via SUBCUTANEOUS
  Administered 2017-09-07: 5 [IU] via SUBCUTANEOUS
  Administered 2017-09-07: 8 [IU] via SUBCUTANEOUS
  Administered 2017-09-08: 3 [IU] via SUBCUTANEOUS
  Filled 2017-09-04: qty 1

## 2017-09-04 MED ORDER — BRIMONIDINE TARTRATE-TIMOLOL 0.2-0.5 % OP SOLN: 1.0000 [drp] | Freq: Two times a day (BID) | OPHTHALMIC | Status: DC

## 2017-09-04 MED ORDER — MORPHINE SULFATE (PF) 4 MG/ML IV SOLN
1.0000 mg | Freq: Four times a day (QID) | INTRAVENOUS | Status: DC | PRN
  Administered 2017-09-05: 1 mg via INTRAVENOUS
  Filled 2017-09-04: qty 1

## 2017-09-04 MED ORDER — LACTATED RINGERS IV SOLN
INTRAVENOUS | Status: DC
  Administered 2017-09-04: 21:00:00 via INTRAVENOUS

## 2017-09-04 MED ORDER — GABAPENTIN 300 MG PO CAPS
600.0000 mg | ORAL_CAPSULE | Freq: Two times a day (BID) | ORAL | Status: DC
  Administered 2017-09-04 – 2017-09-09 (×9): 600 mg via ORAL
  Filled 2017-09-04 (×9): qty 2

## 2017-09-04 MED ORDER — LATANOPROST 0.005 % OP SOLN
1.0000 [drp] | Freq: Every day | OPHTHALMIC | Status: DC
  Filled 2017-09-04 (×2): qty 2.5

## 2017-09-04 MED ORDER — OXYCODONE HCL 5 MG PO TABS: 5.0000 mg | ORAL_TABLET | ORAL | Status: DC | PRN

## 2017-09-04 MED ORDER — INSULIN GLARGINE 100 UNIT/ML ~~LOC~~ SOLN
50.0000 [IU] | Freq: Every day | SUBCUTANEOUS | Status: DC
  Administered 2017-09-05 – 2017-09-09 (×5): 50 [IU] via SUBCUTANEOUS
  Filled 2017-09-04 (×5): qty 0.5

## 2017-09-04 MED ORDER — LACTATED RINGERS IV BOLUS (SEPSIS)
1000.0000 mL | Freq: Once | INTRAVENOUS | Status: AC
  Administered 2017-09-04: 1000 mL via INTRAVENOUS

## 2017-09-04 MED ORDER — ONDANSETRON HCL 4 MG/2ML IJ SOLN: 4.0000 mg | Freq: Four times a day (QID) | INTRAMUSCULAR | Status: DC | PRN

## 2017-09-04 MED ORDER — ONDANSETRON HCL 4 MG PO TABS: 4.0000 mg | ORAL_TABLET | Freq: Four times a day (QID) | ORAL | Status: DC | PRN

## 2017-09-04 MED ORDER — ACETAMINOPHEN 500 MG PO TABS
1000.0000 mg | ORAL_TABLET | Freq: Once | ORAL | Status: AC
  Administered 2017-09-04: 1000 mg via ORAL
  Filled 2017-09-04: qty 2

## 2017-09-04 MED ORDER — ACETAMINOPHEN 325 MG PO TABS
650.0000 mg | ORAL_TABLET | Freq: Four times a day (QID) | ORAL | Status: DC | PRN
  Administered 2017-09-04 – 2017-09-09 (×5): 650 mg via ORAL
  Filled 2017-09-04 (×4): qty 2

## 2017-09-04 MED ORDER — SODIUM CHLORIDE 0.9 % IV SOLN
Freq: Once | INTRAVENOUS | Status: AC
  Administered 2017-09-04: 20:00:00 via INTRAVENOUS

## 2017-09-04 MED ORDER — TRAMADOL HCL 50 MG PO TABS
50.0000 mg | ORAL_TABLET | Freq: Four times a day (QID) | ORAL | Status: DC | PRN
  Administered 2017-09-05: 50 mg via ORAL
  Filled 2017-09-04: qty 1

## 2017-09-04 MED ORDER — ROSUVASTATIN CALCIUM 10 MG PO TABS
10.0000 mg | ORAL_TABLET | Freq: Every day | ORAL | Status: DC
  Administered 2017-09-06 – 2017-09-09 (×4): 10 mg via ORAL
  Filled 2017-09-04 (×5): qty 1

## 2017-09-04 MED ORDER — ACETAMINOPHEN 650 MG RE SUPP: 650.0000 mg | Freq: Four times a day (QID) | RECTAL | Status: DC | PRN

## 2017-09-04 MED ORDER — VANCOMYCIN HCL IN DEXTROSE 1-5 GM/200ML-% IV SOLN
1000.0000 mg | Freq: Once | INTRAVENOUS | Status: AC
  Administered 2017-09-04: 1000 mg via INTRAVENOUS
  Filled 2017-09-04: qty 200

## 2017-09-04 MED ORDER — PIPERACILLIN-TAZOBACTAM 3.375 G IVPB 30 MIN
3.3750 g | Freq: Once | INTRAVENOUS | Status: AC
  Administered 2017-09-04: 3.375 g via INTRAVENOUS
  Filled 2017-09-04: qty 50

## 2017-09-04 MED ORDER — LACTATED RINGERS IV BOLUS
1000.0000 mL | Freq: Once | INTRAVENOUS | Status: AC
  Administered 2017-09-04: 1000 mL via INTRAVENOUS

## 2017-09-04 MED ORDER — IBUPROFEN 400 MG PO TABS
400.0000 mg | ORAL_TABLET | Freq: Once | ORAL | Status: AC
  Administered 2017-09-04: 400 mg via ORAL
  Filled 2017-09-04: qty 1

## 2017-09-04 MED ORDER — VANCOMYCIN HCL IN DEXTROSE 1-5 GM/200ML-% IV SOLN
1000.0000 mg | Freq: Two times a day (BID) | INTRAVENOUS | Status: DC
  Administered 2017-09-05: 1000 mg via INTRAVENOUS
  Filled 2017-09-04: qty 200

## 2017-09-04 MED ORDER — DIFLUPREDNATE 0.05 % OP EMUL: 1.0000 [drp] | Freq: Every day | OPHTHALMIC | Status: DC

## 2017-09-04 MED ORDER — SODIUM CHLORIDE 0.9 % IV SOLN
INTRAVENOUS | Status: DC
  Administered 2017-09-05 – 2017-09-06 (×4): via INTRAVENOUS

## 2017-09-04 MED ORDER — FAMOTIDINE IN NACL 20-0.9 MG/50ML-% IV SOLN
20.0000 mg | Freq: Two times a day (BID) | INTRAVENOUS | Status: DC
  Administered 2017-09-04: 20 mg via INTRAVENOUS
  Filled 2017-09-04: qty 50

## 2017-09-04 MED ORDER — PIPERACILLIN-TAZOBACTAM 3.375 G IVPB
3.3750 g | Freq: Three times a day (TID) | INTRAVENOUS | Status: DC
  Administered 2017-09-05 – 2017-09-08 (×9): 3.375 g via INTRAVENOUS
  Filled 2017-09-04 (×10): qty 50

## 2017-09-04 MED ORDER — VANCOMYCIN HCL IN DEXTROSE 750-5 MG/150ML-% IV SOLN
750.0000 mg | INTRAVENOUS | Status: AC
  Administered 2017-09-04: 750 mg via INTRAVENOUS
  Filled 2017-09-04 (×3): qty 150

## 2017-09-04 NOTE — ED Notes (Signed)
No lab draw,  Pt receiving blood.

## 2017-09-04 NOTE — ED Notes (Signed)
X-ray at bedside

## 2017-09-04 NOTE — ED Provider Notes (Signed)
[place] EMERGENCY DEPARTMENT Provider Note   CSN: [id number] Arrival date & time: 09/04/17  1731     History   Chief Complaint Chief Complaint  Patient presents with  . Hypotension    HPI Chase Clark is a 52 y.o. male.  HPI Patient is a 52 year old male with complex medical history as below who presents after becoming weak today at home causing him to fall forward onto his knees.  He denies any loss of consciousness.  He did not hit his head.  He was admitted recently at [facility] for DKA and was discharged about a week ago.  He reports he has been generally weak over the last few days.  He denies any fever, chills, cough, or shortness of breath.  He does have a chronic wound to his left foot.  His wife reports it is getting worse over the last few days.  Today, he was so weak that when he was trying to walk, his knees gave out causing him to fall forward onto his knees.  He did not hit his head or otherwise become injured today.  Of note, he had a fall a couple of weeks ago and has been having persistent tailbone pain since that time.  He says his stool is light brown and he denies any other bleeding symptoms.  Past Medical History:  Diagnosis Date  . Arthritis   . Asthma    as child  . Coronary artery disease    DES DIAG1 11/20/09 [facility])  . Diabetes mellitus without complication ([place])   . Diverticulosis   . Edema, lower extremity   . Fatty liver   . GERD (gastroesophageal reflux disease)   . Gilbert's disease   . Glaucoma   . Heart disease 2011   Patient has stent for 80% blockage  . Hyperlipidemia   . Hypertension   . Neuropathy   . Seasonal allergies   . Shoulder pain   . Ulcer of foot ([place]) 08.06.2014   DIABETIC ULCERATIONS ASSOCIATED WITH IRRITATION LATERAL ANKLE LEFT GREATER THAN RIGHT WITH MILD CELLULITIS    Patient Active Problem List   Diagnosis Date Noted  . Sepsis ([place]) 09/04/2017  . Pressure injury of skin 08/24/2017  . DKA  (diabetic ketoacidoses) ([place]) 08/23/2017  . Cervical spondylosis with myelopathy and radiculopathy 05/07/2017  . ARF (acute renal failure) ([place]) 04/30/2017  . Upper respiratory infection 04/29/2017  . Preop examination 04/29/2017  . Fatty liver 04/04/2017  . Routine general medical examination at a health care facility 01/13/2017  . DDD (degenerative disc disease), cervical 01/13/2017  . PAD (peripheral artery disease) ([place]) 08/07/2016  . Ulcer of ankle ([place]) 08/07/2016  . Chronic diarrhea 05/17/2016  . Hyponatremia 05/17/2016  . Anemia 01/12/2014  . Disorder of ligament of ankle 07/31/2013  . Edema 06/28/2013  . 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 Obesity (BMI 30-39.9) 06/12/2013  . Pain in joint, shoulder region 06/12/2013  . Statin intolerance 06/10/2013  . Diabetic ulcer of foot with muscle involvement without evidence of necrosis ([place])   . DM (diabetes mellitus), type 2, uncontrolled w/ophthalmic complication ([place]) 62/70/3500  . Hyperlipidemia associated with type 2 diabetes mellitus ([place]) 07/17/2006  . GILBERT'S SYNDROME 07/17/2006  . Essential hypertension 07/17/2006  . Coronary atherosclerosis 07/17/2006  . OSTEOARTHRITIS 07/17/2006  . COUGH 07/17/2006    Past Surgical History:  Procedure Laterality Date  . ABDOMINAL AORTOGRAM N/A 05/20/2016   Procedure: Abdominal Aortogram possible intervention;  Surgeon: Katha Cabal,  MD;  Location: [facility] CV LAB;  Service: Cardiovascular;  Laterality: N/A;  . ANTERIOR CERVICAL DECOMP/DISCECTOMY FUSION N/A 05/07/2017   Procedure: ANTERIOR CERVICAL DECOMPRESSION/DISCECTOMY Luevenia Maxin PROSTHESIS,PLATE/SCREWS CERVICAL FIVE - CERVICAL SIX;  Surgeon: Newman Pies, MD;  Location: [facility];  Service: Neurosurgery;  Laterality: N/A;  . CARDIAC CATHETERIZATION    . CATARACT EXTRACTION W/ INTRAOCULAR LENS IMPLANT    . CATARACT EXTRACTION W/PHACO Left 02/26/2017   Procedure: CATARACT EXTRACTION PHACO AND  INTRAOCULAR LENS PLACEMENT (IOC);  Surgeon: Eulogio Bear, MD;  Location: [facility] ORS;  Service: Ophthalmology;  Laterality: Left;  Lot # [id number] H [place]: 00:25.3 AP%: 6.2 CDE: 1.59   . CHOLECYSTECTOMY N/A   . CORONARY ANGIOPLASTY WITH STENT PLACEMENT  2007   1st diagonal  . EPIBLEPHERON REPAIR WITH TEAR DUCT PROBING    . EYE SURGERY Right sept 29n2015   cataract extraction  . INSERTION EXPRESS TUBE SHUNT Right 09/06/2015   Procedure: INSERTION AHMED TUBE SHUNT with tutoplast allograft;  Surgeon: Eulogio Bear, MD;  Location: [facility] ORS;  Service: Ophthalmology;  Laterality: Right;  Marland Kitchen VASECTOMY          Home Medications    Prior to Admission medications   Medication Sig Start Date End Date Taking? Authorizing Provider  aspirin EC 81 MG tablet Take 81 mg by mouth daily.   Yes [provider]  brimonidine-timolol (COMBIGAN) 0.2-0.5 % ophthalmic solution Place 1 drop into the right eye every 12 (twelve) hours.    Yes [provider]  DUREZOL 0.05 % EMUL Place 1 drop into the left eye at bedtime 03/27/17  Yes [provider]  esomeprazole (NEXIUM) 40 MG capsule Take 1 capsule (40 mg total) by mouth daily. Patient taking differently: Take 40 mg by mouth daily.  05/21/16  Yes Wieting, Richard, MD  furosemide (LASIX) 40 MG tablet Take 40 mg by mouth daily as needed for fluid or edema.  04/29/17  Yes [provider]  gabapentin (NEURONTIN) 300 MG capsule Take 2 capsules (600 mg total) by mouth 2 (two) times daily. 05/25/17  Yes [name], MD  insulin aspart (NOVOLOG FLEXPEN) 100 UNIT/ML FlexPen CBG121-150: 1u; CBG151-200: 2u; CBG201-250: 3u; CBG251-300: 5u; CBG301-350: 7u; CBG351-400: 9u Patient taking differently: Inject 1-9 Units into the skin See admin instructions. Inject 1-9 units into the skin three times a day before meals, per sliding scale: BGL 121-150 = 1 unit; 151-200 = 2 units; 201-250 = 3 units; 251-300 = 5 units; 301-350 = 7 units; 351-400  = 9 units 08/28/17  Yes Mikhail, [name], DO  insulin glargine (LANTUS) 100 UNIT/ML injection Inject 0.5 mLs (50 Units total) into the skin daily. Patient taking differently: Inject 50 Units into the skin daily before breakfast.  08/28/17  Yes Mikhail, [name], DO  latanoprost (XALATAN) 0.005 % ophthalmic solution Place 1 drop into both eyes at bedtime. 03/03/17  Yes [provider]  losartan-hydrochlorothiazide (HYZAAR) 100-25 MG tablet Take 1 tablet by mouth daily. 07/15/15  Yes [provider]  Menthol, Topical Analgesic, (BLUE-EMU MAXIMUM STRENGTH EX) Apply 1 application topically See admin instructions. Apply to the back daily as directed for pain   Yes [provider]  metFORMIN (GLUCOPHAGE) 500 MG tablet Take 500 mg by mouth 2 (two) times daily with a meal.   Yes [provider]  metoprolol tartrate (LOPRESSOR) 100 MG tablet Take 1 tablet (100 mg total) by mouth 2 (two) times daily. 05/25/17  Yes [name], MD  rosuvastatin (CRESTOR) 10 MG  tablet Take 10 mg by mouth daily. 08/19/17  Yes [provider]  Continuous Blood Gluc Receiver (Stella) [place] 1 each by Does not apply route as directed. Use to check blood glucose 5 times daily 08/26/17   [name], MD  Continuous Blood Gluc Sensor (DEXCOM G6 SENSOR) MISC 1 each by Does not apply route as directed. Place 1 sensor subcutaneously once every 10 days. 08/26/17   [name], MD  Continuous Blood Gluc Transmit (DEXCOM G6 TRANSMITTER) MISC 1 each by Does not apply route as directed. Use to check blood glucose 5 times daily. Refill every 3 months. 08/26/17   [name], MD  Insulin Pen Needle (PEN NEEDLES) 32G X 4 MM MISC Inject 90 each 3 (three) times daily into the skin. 02/23/17   [name], MD  metoCLOPramide (REGLAN) 5 MG tablet Take 1 tablet (5 mg total) by mouth 4 (four) times daily -  before meals and at bedtime. 08/28/17   Mikhail, [name], DO  oxyCODONE 10  MG TABS Take 1 tablet (10 mg total) by mouth every 4 (four) hours as needed for severe pain ((score 7 to 10)). Patient not taking: Reported on 09/04/2017 05/08/17   Newman Pies, MD  rosuvastatin (CRESTOR) 20 MG tablet Take 1 tablet (20 mg total) daily by mouth. Patient not taking: Reported on 09/04/2017 02/23/17   [name], MD    Family History Family History  Problem Relation Age of Onset  . Hyperlipidemia Mother   . Diabetes Mother   . Liver disease Mother        NASH  . Hyperlipidemia Father   . Diabetes Father   . Heart disease Father   . Hypertension Father     Social History Social History   Tobacco Use  . Smoking status: Former Smoker    Types: Cigarettes    Last attempt to quit: 03/14/2008    Years since quitting: 9.4  . Smokeless tobacco: Never Used  Substance Use Topics  . Alcohol use: Yes    Comment: OCCAS  . Drug use: No     Allergies   Atorvastatin   Review of Systems Review of Systems  Constitutional: Negative for chills and fever.  HENT: Negative for ear pain and sore throat.   Eyes: Negative for pain and visual disturbance.  Respiratory: Negative for cough and shortness of breath.   Cardiovascular: Negative for chest pain and palpitations.  Gastrointestinal: Negative for abdominal pain and vomiting.  Genitourinary: Negative for dysuria and hematuria.  Musculoskeletal: Positive for back pain. Negative for arthralgias.  Skin: Positive for pallor and wound. Negative for color change and rash.  Neurological: Positive for weakness and light-headedness. Negative for seizures and syncope.  All other systems reviewed and are negative.    Physical Exam Updated Vital Signs BP (!) 109/50   Pulse 81   Temp (!) 100.8 F (38.2 C)   Resp 19   Ht 5\' 11"  (1.803 m)   Wt 84.8 kg (187 lb)   SpO2 98%   BMI 26.08 kg/m   Physical Exam  Constitutional: He is oriented to person, place, and time. He appears well-developed and well-nourished. He  appears distressed.  HENT:  Head: Normocephalic and atraumatic.  Eyes: Conjunctivae are normal.  Neck: Neck supple.  Cardiovascular: Normal rate, regular rhythm and intact distal pulses.  No murmur heard. Pulmonary/Chest: Effort normal and breath sounds normal. No respiratory distress.  Abdominal: Soft. There is no tenderness.  Genitourinary:  Genitourinary Comments: Light brown stool on digital rectal exam.  Musculoskeletal: He exhibits deformity. He exhibits no edema.  Chronic deformity to left foot.  There is a chronic appearing wound to the plantar aspect of the left foot, just distal to the calcaneus.  No purulent drainage from the wound at this time.  Neurological: He is alert and oriented to person, place, and time.  Skin: Skin is warm and dry. There is pallor.  Slight skin breakdown at the patient's sacrum.  Psychiatric: He has a normal mood and affect.  Nursing note and vitals reviewed.    ED Treatments / Results  Labs (all labs ordered are listed, but only abnormal results are displayed) Labs Reviewed  COMPREHENSIVE METABOLIC PANEL - Abnormal; Notable for the following components:      Result Value   Sodium 130 (*)    Chloride 90 (*)    Glucose, Bld 258 (*)    BUN 23 (*)    Creatinine, Ser 2.22 (*)    Calcium 8.3 (*)    Total Protein 5.7 (*)    Albumin 1.4 (*)    ALT 8 (*)    GFR calc non Af Amer 32 (*)    GFR calc Af Amer 37 (*)    All other components within normal limits  CBC WITH DIFFERENTIAL/PLATELET - Abnormal; Notable for the following components:   WBC 16.7 (*)    RBC 2.22 (*)    Hemoglobin 6.1 (*)    HCT 18.7 (*)    Neutro Abs 14.9 (*)    Abs Immature Granulocytes 0.2 (*)    All other components within normal limits  SEDIMENTATION RATE - Abnormal; Notable for the following components:   Sed Rate >140 (*)    All other components within normal limits  CBG MONITORING, ED - Abnormal; Notable for the following components:   Glucose-Capillary 259 (*)      All other components within normal limits  I-STAT CG4 LACTIC ACID, ED - Abnormal; Notable for the following components:   Lactic Acid, Venous 4.44 (*)    All other components within normal limits  I-STAT VENOUS BLOOD GAS, ED - Abnormal; Notable for the following components:   pO2, Ven 23.0 (*)    Bicarbonate 29.5 (*)    Acid-Base Excess 3.0 (*)    All other components within normal limits  CULTURE, BLOOD (ROUTINE X 2)  CULTURE, BLOOD (ROUTINE X 2)  URINE CULTURE  URINALYSIS, ROUTINE W REFLEX MICROSCOPIC  PROCALCITONIN  PROCALCITONIN  LACTIC ACID, PLASMA  LACTIC ACID, PLASMA  CORTISOL  RETICULOCYTES  LACTATE DEHYDROGENASE  HAPTOGLOBIN  SAVE SMEAR  FIBRINOGEN  PROTIME-INR  APTT  CBC  CBC  VITAMIN B12  FOLATE RBC  FERRITIN  IRON AND TIBC  MAGNESIUM  PHOSPHORUS  URINALYSIS, ROUTINE W REFLEX MICROSCOPIC  COMPREHENSIVE METABOLIC PANEL  POC OCCULT BLOOD, ED  TYPE AND SCREEN  PREPARE RBC (CROSSMATCH)    EKG None  Radiology Dg Sacrum/coccyx  Result Date: 09/04/2017 CLINICAL DATA:  Fall with sacral pain EXAM: SACRUM AND COCCYX - 2+ VIEW COMPARISON:  None. FINDINGS: There is no evidence of fracture or other focal bone lesions. IMPRESSION: Negative. Electronically Signed   By: Donavan Foil M.D.   On: 09/04/2017 19:59   Dg Chest Port 1 View  Result Date: 09/04/2017 CLINICAL DATA:  Sepsis EXAM: PORTABLE CHEST 1 VIEW COMPARISON:  09/04/2015 CXR FINDINGS: The heart size and mediastinal contours are within normal limits. Minimal aortic atherosclerosis is noted. Both lungs are clear. ACDF  hardware across the C5-6 interspace. The visualized skeletal structures are unremarkable. IMPRESSION: No active disease. Electronically Signed   By: Ashley Royalty M.D.   On: 09/04/2017 18:30   Dg Foot 2 Views Left  Result Date: 09/04/2017 CLINICAL DATA:  Sepsis EXAM: LEFT FOOT - 2 VIEW COMPARISON:  08/23/2017 radiographs and CT FINDINGS: Soft tissue wound along the plantar aspect of the heel.  Plantar calcaneal cortical irregularity is stable subjacent to skin ulceration. There is persistent soft tissue swelling along the plantar aspect of the mid and hindfoot though less so since prior. Interval increase in soft tissue swelling is noted of the forefoot however since prior. Lucencies along the plantar aspect of the foot may represent soft tissue emphysema. Remote calcaneal fracture with healing redemonstrated. Degenerative joint space narrowing of DIP and PIP joints of the second through fifth digits, first through fifth MTP articulations and midfoot. IMPRESSION: 1. Redemonstration of large soft tissue defect along the plantar aspect of the calcaneus with cortical irregularity and osteopenia subjacent to the skin wound compatible with chronic changes of osteomyelitis possibly acute on chronic. No significant bony changes are identified. 2. Soft tissue swelling along the plantar aspect of the mid and hindfoot persists with subcutaneous soft tissue emphysema suggested with decreased swelling along the plantar and dorsal aspect of the foot but increased along the dorsum and plantar aspect of the forefoot. Electronically Signed   By: Ashley Royalty M.D.   On: 09/04/2017 18:37    Procedures Procedures (including critical care time)  Medications Ordered in ED Medications  vancomycin (VANCOCIN) IVPB 1000 mg/200 mL premix (has no administration in time range)  piperacillin-tazobactam (ZOSYN) IVPB 3.375 g (has no administration in time range)  insulin aspart (novoLOG) injection 0-15 Units (has no administration in time range)  lactated ringers infusion ( Intravenous New Bag/Given 09/04/17 2125)  brimonidine-timolol (COMBIGAN) 0.2-0.5 % ophthalmic solution 1 drop (has no administration in time range)  Difluprednate 0.05 % EMUL 1 drop (has no administration in time range)  gabapentin (NEURONTIN) capsule 600 mg (600 mg Oral Given 09/04/17 2313)  insulin glargine (LANTUS) injection 50 Units (has no  administration in time range)  latanoprost (XALATAN) 0.005 % ophthalmic solution 1 drop (has no administration in time range)  rosuvastatin (CRESTOR) tablet 10 mg (has no administration in time range)  0.9 %  sodium chloride infusion (has no administration in time range)  acetaminophen (TYLENOL) tablet 650 mg (650 mg Oral Given 09/04/17 2314)    Or  acetaminophen (TYLENOL) suppository 650 mg ( Rectal See Alternative 09/04/17 2314)  oxyCODONE (Oxy IR/ROXICODONE) immediate release tablet 5 mg (has no administration in time range)  ondansetron (ZOFRAN) tablet 4 mg (has no administration in time range)    Or  ondansetron (ZOFRAN) injection 4 mg (has no administration in time range)  famotidine (PEPCID) IVPB 20 mg premix (20 mg Intravenous New Bag/Given 09/04/17 2315)  traMADol (ULTRAM) tablet 50 mg (has no administration in time range)  morphine 4 MG/ML injection 1 mg (has no administration in time range)  lactated ringers bolus 1,000 mL (0 mLs Intravenous Stopped 09/04/17 2026)    And  lactated ringers bolus 1,000 mL (0 mLs Intravenous Stopped 09/04/17 2026)    And  lactated ringers bolus 1,000 mL (0 mLs Intravenous Stopped 09/04/17 2316)  piperacillin-tazobactam (ZOSYN) IVPB 3.375 g (0 g Intravenous Stopped 09/04/17 1840)  vancomycin (VANCOCIN) IVPB 1000 mg/200 mL premix (0 mg Intravenous Stopped 09/04/17 2006)  vancomycin (VANCOCIN) IVPB 750 mg/150 ml premix (0 mg Intravenous  Stopped 09/04/17 2204)  0.9 %  sodium chloride infusion ( Intravenous Stopped 09/04/17 2253)  acetaminophen (TYLENOL) tablet 1,000 mg (1,000 mg Oral Given 09/04/17 1926)  lactated ringers bolus 1,000 mL (0 mLs Intravenous Stopped 09/04/17 2316)  ibuprofen (ADVIL,MOTRIN) tablet 400 mg (400 mg Oral Given 09/04/17 2218)     Initial Impression / Assessment and Plan / ED Course  I have reviewed the triage vital signs and the nursing notes.  Pertinent labs & imaging results that were available during my care of the patient were  reviewed by me and considered in my medical decision making (see chart for details).  Clinical Course as of Sep 04 2341  Fri Sep 04, 2017  2043 Sepsis reassessment completed.   [AN]  2050 Sepsis reassessment completed   [JB]    Clinical Course User Index [AN] Varney Biles, MD [JB] Clifton James, MD    Patient is a 52 year old male with complicated medical history as above, who presents due to generalized weakness.  Patient was recently discharged after being admitted for DKA last week.  He does have a chronic wound to his left foot.  EMS reports his initial systolic pressure was in the 50s.  Patient was alert and oriented at that time.  He received about 1 L of crystalloid prior to arrival.  Upon arrival here, his initial pressure manually is in the 93O systolic.  Sepsis work-up initiated.  The patient is also quite pale.  He has light brown stool on his stool exam.  No history of GI bleeding.  He was given a 30 cc/kg fluid bolus.  Broad-spectrum antibiotics given.  His initial temperature is 100.2 rectally.  His labs are notable for hemoglobin of 6, leukocytosis, elevated creatinine, elevated lactate at 4.4, elevated inflammatory markers.  He is hyperglycemic in the 250 range with no evidence of DKA.  I suspect he has a combination of hypotension due to anemia and sepsis.  I suspect his infectious source is the left foot.  X-ray showed suspected acute on chronic osteomyelitis.  He was given 2 units of emergency release blood in the emergency department.  The patient's blood pressure responded well to the fluid and blood resuscitation.  He has no clinical signs of bleeding here.  Critical care was initially consulted for admission.  They have seen the patient in the emergency department.  Given that his blood pressures responded well to treatment, they feel the patient will be most appropriate in the stepdown unit.  Hospitalist consulted for admission.  Final Clinical Impressions(s) / ED  Diagnoses   Final diagnoses:  Sepsis, due to unspecified organism ([place])  Anemia, unspecified type  Lactic acidosis  Osteomyelitis of left foot, unspecified type [facility])    ED Discharge Orders    None       Clifton James, MD 09/04/17 [place], Shaniko, MD 09/05/17 [phone]

## 2017-09-04 NOTE — Consult Note (Signed)
Name: Chase Clark MRN: [id number] DOB: [birth year]    ADMISSION DATE:  09/04/2017 CONSULTATION DATE:  09/04/2017  REFERRING MD :  Melina Copa   CHIEF COMPLAINT:  Sepsis   HISTORY OF PRESENT ILLNESS:   52 year old male with PMH of DM, CAD, GERD, Fatty Liver, HTN, HLD, Left Foot Ulcer (Follows at wound center of [facility]) with chronic osteomyelitis   Recent Admission 5/12-5/17 with DKA. Presents to ED on 5/24 s/p Fall. Patient reports he became weak and went down on knees. On arrival BP 65/33. Wife reports that chronic wound to left heel has gotten worse (due to recent hospitalization they have missed appointment at wound care center, where he has frequent debridements). LA 4.4. Hemoglobin 6.1. Received 2 units RBC and 3 L LR in ED with improvement of BP. PCCM asked to consult.   SIGNIFICANT EVENTS  5/17 > Discharge for WL  5/24 > Presents to ED   STUDIES:  CXR 5/24 > No active disease  DG Foot 5/24 > 1. Redemonstration of large soft tissue defect along the plantar aspect of the calcaneus with cortical irregularity and osteopenia subjacent to the skin wound compatible with chronic changes of osteomyelitis possibly acute on chronic. No significant bony changes are identified. 2. Soft tissue swelling along the plantar aspect of the mid and hindfoot persists with subcutaneous soft tissue emphysema suggested with decreased swelling along the plantar and dorsal aspect of the foot but increased along the dorsum and plantar aspect of the forefoot. DG Sacrum 5/24 > No evidence fracture/focal bone lesions   PAST MEDICAL HISTORY :   has a past medical history of Arthritis, Asthma, Coronary artery disease, Diabetes mellitus without complication ([place]), Diverticulosis, Edema, lower extremity, Fatty liver, GERD (gastroesophageal reflux disease), Gilbert's disease, Glaucoma, Heart disease (2011), Hyperlipidemia, Hypertension, Neuropathy, Seasonal allergies, Shoulder pain, and Ulcer of foot ([place])  (08.06.2014).  has a past surgical history that includes Cholecystectomy (N/A); Vasectomy; Eye surgery (Right, sept [phone]); Coronary angioplasty with stent (2007); Cardiac catheterization; Cataract extraction w/ intraocular lens implant; Insertion express tube shunt (Right, 09/06/2015); Epiblepheron repair with tear duct probing; ABDOMINAL AORTOGRAM (N/A, 05/20/2016); Cataract extraction w/PHACO (Left, 02/26/2017); and Anterior cervical decomp/discectomy fusion (N/A, 05/07/2017). Prior to Admission medications   Medication Sig Start Date End Date Taking? Authorizing Provider  aspirin EC 81 MG tablet Take 81 mg by mouth daily.   Yes [provider]  brimonidine-timolol (COMBIGAN) 0.2-0.5 % ophthalmic solution Place 1 drop into the right eye every 12 (twelve) hours.    Yes [provider]  DUREZOL 0.05 % EMUL Place 1 drop into the left eye at bedtime 03/27/17  Yes [provider]  esomeprazole (NEXIUM) 40 MG capsule Take 1 capsule (40 mg total) by mouth daily. Patient taking differently: Take 40 mg by mouth daily.  05/21/16  Yes Clark, Richard, MD  furosemide (LASIX) 40 MG tablet Take 40 mg by mouth daily as needed for fluid or edema.  04/29/17  Yes [provider]  gabapentin (NEURONTIN) 300 MG capsule Take 2 capsules (600 mg total) by mouth 2 (two) times daily. 05/25/17  Yes [name], MD  insulin aspart (NOVOLOG FLEXPEN) 100 UNIT/ML FlexPen CBG121-150: 1u; CBG151-200: 2u; CBG201-250: 3u; CBG251-300: 5u; CBG301-350: 7u; CBG351-400: 9u Patient taking differently: Inject 1-9 Units into the skin See admin instructions. Inject 1-9 units into the skin three times a day before meals, per sliding scale: BGL 121-150 = 1 unit; 151-200 = 2 units; 201-250 = 3 units; 251-300 =  5 units; 301-350 = 7 units; 351-400 = 9 units 08/28/17  Yes Clark, [name], DO  insulin glargine (LANTUS) 100 UNIT/ML injection Inject 0.5 mLs (50 Units total) into the skin daily. Patient taking  differently: Inject 50 Units into the skin daily before breakfast.  08/28/17  Yes Clark, Chase Addison, DO  latanoprost (XALATAN) 0.005 % ophthalmic solution Place 1 drop into both eyes at bedtime. 03/03/17  Yes [provider]  losartan-hydrochlorothiazide (HYZAAR) 100-25 MG tablet Take 1 tablet by mouth daily. 07/15/15  Yes [provider]  Menthol, Topical Analgesic, (BLUE-EMU MAXIMUM STRENGTH EX) Apply 1 application topically See admin instructions. Apply to the back daily as directed for pain   Yes [provider]  metFORMIN (GLUCOPHAGE) 500 MG tablet Take 500 mg by mouth 2 (two) times daily with a meal.   Yes [provider]  metoprolol tartrate (LOPRESSOR) 100 MG tablet Take 1 tablet (100 mg total) by mouth 2 (two) times daily. 05/25/17  Yes [name], MD  rosuvastatin (CRESTOR) 10 MG tablet Take 10 mg by mouth daily. 08/19/17  Yes [provider]  Continuous Blood Gluc Receiver ([place]) [place] 1 each by Does not apply route as directed. Use to check blood glucose 5 times daily 08/26/17   [name], MD  Continuous Blood Gluc Sensor (DEXCOM G6 SENSOR) MISC 1 each by Does not apply route as directed. Place 1 sensor subcutaneously once every 10 days. 08/26/17   [name], MD  Continuous Blood Gluc Transmit (DEXCOM G6 TRANSMITTER) MISC 1 each by Does not apply route as directed. Use to check blood glucose 5 times daily. Refill every 3 months. 08/26/17   [name], MD  Insulin Pen Needle (PEN NEEDLES) 32G X 4 MM MISC Inject 90 each 3 (three) times daily into the skin. 02/23/17   Chase Mc, MD  metoCLOPramide (REGLAN) 5 MG tablet Take 1 tablet (5 mg total) by mouth 4 (four) times daily -  before meals and at bedtime. 08/28/17   Clark, Chase Addison, DO  oxyCODONE 10 MG TABS Take 1 tablet (10 mg total) by mouth every 4 (four) hours as needed for severe pain ((score 7 to 10)). Patient not taking: Reported on 09/04/2017 05/08/17    Newman Pies, MD  rosuvastatin (CRESTOR) 20 MG tablet Take 1 tablet (20 mg total) daily by mouth. Patient not taking: Reported on 09/04/2017 02/23/17   Chase Mc, MD   Allergies  Allergen Reactions  . Atorvastatin Other (See Comments)    Muscle aches     FAMILY HISTORY:  family history includes Diabetes in his father and mother; Heart disease in his father; Hyperlipidemia in his father and mother; Hypertension in his father; Liver disease in his mother. SOCIAL HISTORY:  reports that he quit smoking about 9 years ago. His smoking use included cigarettes. He has never used smokeless tobacco. He reports that he drinks alcohol. He reports that he does not use drugs.  REVIEW OF SYSTEMS:   All negative; except for those that are bolded, which indicate positives.  Constitutional: weight loss, weight gain, night sweats, fevers, chills, fatigue, weakness.  HEENT: headaches, sore throat, sneezing, nasal congestion, post nasal drip, difficulty swallowing, tooth/dental problems, visual complaints, visual changes, ear aches. Neuro: difficulty with speech, weakness, numbness, ataxia. CV:  chest pain, orthopnea, PND, swelling in lower extremities, dizziness, palpitations, syncope.  Resp: cough, hemoptysis, dyspnea, wheezing. GI: heartburn, indigestion, abdominal pain, nausea, vomiting, diarrhea, constipation, change in bowel habits, loss of  appetite, hematemesis, melena, hematochezia.  GU: dysuria, change in color of urine, urgency or frequency, flank pain, hematuria. MSK: joint pain or swelling, decreased range of motion. Psych: change in mood or affect, depression, anxiety, suicidal ideations, homicidal ideations. Skin: rash, itching, bruising.  SUBJECTIVE:   VITAL SIGNS: Temp:  [98.6 F (37 C)-100.2 F (37.9 C)] 99.8 F (37.7 C) (05/24 2106) Pulse Rate:  [75-82] 82 (05/24 2145) Resp:  [11-22] 11 (05/24 2106) BP: (82-127)/(52-77) 127/70 (05/24 2145) SpO2:  [90 %-100 %] 97 %  (05/24 2145) Weight:  [84.8 kg (187 lb)] 84.8 kg (187 lb) (05/24 1742)  PHYSICAL EXAMINATION: General:  Adult male, no distress  Neuro:  Alert, oriented, follows commands  HEENT:  Dry MM  Cardiovascular:  RRR, no MRG  Lungs:  Clear breath sounds, non-labored  Abdomen:  Non-distended, active bowel sounds  Musculoskeletal:  Left heal wound with foul odor  Skin:  Warm, dry   Recent Labs  Lab 09/04/17 1745  NA 130*  K 3.7  CL 90*  CO2 27  BUN 23*  CREATININE 2.22*  GLUCOSE 258*   Recent Labs  Lab 09/04/17 1745  HGB 6.1*  HCT 18.7*  WBC 16.7*  PLT 299   Dg Sacrum/coccyx  Result Date: 09/04/2017 CLINICAL DATA:  Fall with sacral pain EXAM: SACRUM AND COCCYX - 2+ VIEW COMPARISON:  None. FINDINGS: There is no evidence of fracture or other focal bone lesions. IMPRESSION: Negative. Electronically Signed   By: Donavan Foil M.D.   On: 09/04/2017 19:59   Dg Chest Port 1 View  Result Date: 09/04/2017 CLINICAL DATA:  Sepsis EXAM: PORTABLE CHEST 1 VIEW COMPARISON:  09/04/2015 CXR FINDINGS: The heart size and mediastinal contours are within normal limits. Minimal aortic atherosclerosis is noted. Both lungs are clear. ACDF hardware across the C5-6 interspace. The visualized skeletal structures are unremarkable. IMPRESSION: No active disease. Electronically Signed   By: Ashley Royalty M.D.   On: 09/04/2017 18:30   Dg Foot 2 Views Left  Result Date: 09/04/2017 CLINICAL DATA:  Sepsis EXAM: LEFT FOOT - 2 VIEW COMPARISON:  08/23/2017 radiographs and CT FINDINGS: Soft tissue wound along the plantar aspect of the heel. Plantar calcaneal cortical irregularity is stable subjacent to skin ulceration. There is persistent soft tissue swelling along the plantar aspect of the mid and hindfoot though less so since prior. Interval increase in soft tissue swelling is noted of the forefoot however since prior. Lucencies along the plantar aspect of the foot may represent soft tissue emphysema. Remote calcaneal  fracture with healing redemonstrated. Degenerative joint space narrowing of DIP and PIP joints of the second through fifth digits, first through fifth MTP articulations and midfoot. IMPRESSION: 1. Redemonstration of large soft tissue defect along the plantar aspect of the calcaneus with cortical irregularity and osteopenia subjacent to the skin wound compatible with chronic changes of osteomyelitis possibly acute on chronic. No significant bony changes are identified. 2. Soft tissue swelling along the plantar aspect of the mid and hindfoot persists with subcutaneous soft tissue emphysema suggested with decreased swelling along the plantar and dorsal aspect of the foot but increased along the dorsum and plantar aspect of the forefoot. Electronically Signed   By: Ashley Royalty M.D.   On: 09/04/2017 18:37    ASSESSMENT / PLAN:  Sepsis in setting of suspected acute on chronic osteomyelitis of left foot -BP now [phone] Systolic -Orthopedics Consulted last admission, feels candidate for BKA once DM is controlled   -Xray Left Foot with soft tissue  swelling along plantar aspect of the mid and hindfoot  Plan  -Cardiac Monitoring  -Maintain Systolic >93 and MAP >26  -Trend PCT and LA  -Continue Vancomycin/Zosyn  -Follow Culture Data -MRI pending -Ortho consult    Anion Gap Metabolic Acidosis with Lactic Acidosis  Acute on Chronic Kidney Disease Stage 3 (Baseline crt 1.0-1.5) Plan  -Trend BMP  -Trend LA  -S/P 4L LR, Continue LR @ 75 ml/hr   Normochromic Anemia in setting of critical illness/inflammation vs acute hemolytic process  -AST/ALT 18/8, Bilirubin 0.7 -MVC 84.2  H/O Chronic Anemia with baseline hemoglobin 10 Plan  -Trend CBC q6h (s/p 2 units RBC)  -Transfuse for Hemoglobin <7 -Hold all anticoagulation  -Haptoglobin, LDH, Coag Panel, Smear, Reticulocytes   Patient at this time is hemodynamically stable and can be admitted with [facility].   Hayden Pedro, AGACNP-BC [facility]  Pgr: [phone]  PCCM Pgr: [phone]

## 2017-09-04 NOTE — ED Notes (Signed)
o2 sats down to 88%, no wheezing on auscultation. Placed on 2L [place].

## 2017-09-04 NOTE — H&P (Signed)
History and Physical   [facility] [place] @ [place] Admission History and Physical [employer], D.O.    Patient Name: Chase Clark MR#: [id number] Date of Birth: [birth year] Date of Admission: 09/04/2017  Referring MD/NP/PA: Dr. Melina Copa Primary Care Physician: [name], MD  Chief Complaint:  Chief Complaint  Patient presents with  . Hypotension    HPI: Chase Clark is a 52 y.o. male with a known history of CAD, DM, GERD, HTN, HLD, left sided diabetic foot ulcer with chronic osteo (follows at wound center at [facility] where he has frequent debridements) presents to the emergency department for evaluation of weakness, near syncope.  Patient was in a usual state of health until last week he was admitted at [facility] for DKA.  He was discharged home one week ago and has been weak ever since.  He sustained two near syncopal events today at home falling to his knees and EMS had trouble getting a blood pressure per wife.  Patient's family also states that he has been complaining of low back pain since a fall about one month ago.  He has also been complaining of worsening left foot pain.  Has been having the conversation with orthopedics, Dr. Berenice Primas regarding BKA which was recommended once DM is controlled.   Patient denies fevers/chills, dizziness, chest pain, shortness of breath, N/V/C/D, abdominal pain, dysuria/frequency, changes in mental status.     EMS/ED Course: Patient received Zosyn, Vanco, blood transfusion. Medical admission has been requested for further management of sepsis 2/2 osteomyelitis, acute blood loss anemia.  Review of Systems:  CONSTITUTIONAL: Positive weakness.  No fever/chills, fatigue,  weight gain/loss, headache. EYES: No blurry or double vision. ENT: No tinnitus, postnasal drip, redness or soreness of the oropharynx. RESPIRATORY: No cough, dyspnea, wheeze.  No hemoptysis.  CARDIOVASCULAR: No chest pain, palpitations,  orthopnea. GASTROINTESTINAL: No nausea, vomiting, abdominal pain, diarrhea, constipation.  No hematemesis, melena or hematochezia. GENITOURINARY: No dysuria, frequency, hematuria. ENDOCRINE: No polyuria or nocturia. No heat or cold intolerance. HEMATOLOGY: No anemia, bruising, bleeding. INTEGUMENTARY: No rashes, ulcers, lesions. MUSCULOSKELETAL: Positive low back pain. No arthritis, gout. NEUROLOGIC: No numbness, tingling, ataxia, seizure-type activity PSYCHIATRIC: No anxiety, depression, insomnia.   Past Medical History:  Diagnosis Date  . Arthritis   . Asthma    as child  . Coronary artery disease    DES DIAG1 11/20/09 [facility])  . Diabetes mellitus without complication ([place])   . Diverticulosis   . Edema, lower extremity   . Fatty liver   . GERD (gastroesophageal reflux disease)   . Gilbert's disease   . Glaucoma   . Heart disease 2011   Patient has stent for 80% blockage  . Hyperlipidemia   . Hypertension   . Neuropathy   . Seasonal allergies   . Shoulder pain   . Ulcer of foot ([place]) 08.06.2014   DIABETIC ULCERATIONS ASSOCIATED WITH IRRITATION LATERAL ANKLE LEFT GREATER THAN RIGHT WITH MILD CELLULITIS    Past Surgical History:  Procedure Laterality Date  . ABDOMINAL AORTOGRAM N/A 05/20/2016   Procedure: Abdominal Aortogram possible intervention;  Surgeon: Katha Cabal, MD;  Location: [facility];  Service: Cardiovascular;  Laterality: N/A;  . ANTERIOR CERVICAL DECOMP/DISCECTOMY FUSION N/A 05/07/2017   Procedure: ANTERIOR CERVICAL DECOMPRESSION/DISCECTOMY Luevenia Maxin PROSTHESIS,PLATE/SCREWS CERVICAL FIVE - CERVICAL SIX;  Surgeon: Newman Pies, MD;  Location: [place];  Service: Neurosurgery;  Laterality: N/A;  . CARDIAC CATHETERIZATION    . CATARACT EXTRACTION W/ INTRAOCULAR LENS IMPLANT    .  CATARACT EXTRACTION W/PHACO Left 02/26/2017   Procedure: CATARACT EXTRACTION PHACO AND INTRAOCULAR LENS PLACEMENT (IOC);  Surgeon: Eulogio Bear, MD;  Location:  [facility] ORS;  Service: Ophthalmology;  Laterality: Left;  Lot # [id number] H [place]: 00:25.3 AP%: 6.2 CDE: 1.59   . CHOLECYSTECTOMY N/A   . CORONARY ANGIOPLASTY WITH STENT PLACEMENT  2007   1st diagonal  . EPIBLEPHERON REPAIR WITH TEAR DUCT PROBING    . EYE SURGERY Right sept 29n2015   cataract extraction  . INSERTION EXPRESS TUBE SHUNT Right 09/06/2015   Procedure: INSERTION AHMED TUBE SHUNT with tutoplast allograft;  Surgeon: Eulogio Bear, MD;  Location: [facility] ORS;  Service: Ophthalmology;  Laterality: Right;  Marland Kitchen VASECTOMY       reports that he quit smoking about 9 years ago. His smoking use included cigarettes. He has never used smokeless tobacco. He reports that he drinks alcohol. He reports that he does not use drugs.  Allergies  Allergen Reactions  . Atorvastatin Other (See Comments)    Muscle aches     Family History  Problem Relation Age of Onset  . Hyperlipidemia Mother   . Diabetes Mother   . Liver disease Mother        NASH  . Hyperlipidemia Father   . Diabetes Father   . Heart disease Father   . Hypertension Father     Prior to Admission medications   Medication Sig Start Date End Date Taking? Authorizing Provider  aspirin EC 81 MG tablet Take 81 mg by mouth daily.   Yes [provider]  brimonidine-timolol (COMBIGAN) 0.2-0.5 % ophthalmic solution Place 1 drop into the right eye every 12 (twelve) hours.    Yes [provider]  DUREZOL 0.05 % EMUL Place 1 drop into the left eye at bedtime 03/27/17  Yes [provider]  esomeprazole (NEXIUM) 40 MG capsule Take 1 capsule (40 mg total) by mouth daily. Patient taking differently: Take 40 mg by mouth daily.  05/21/16  Yes Wieting, Richard, MD  furosemide (LASIX) 40 MG tablet Take 40 mg by mouth daily as needed for fluid or edema.  04/29/17  Yes [provider]  gabapentin (NEURONTIN) 300 MG capsule Take 2 capsules (600 mg total) by mouth 2 (two) times daily. 05/25/17  Yes [name],  MD  insulin aspart (NOVOLOG FLEXPEN) 100 UNIT/ML FlexPen CBG121-150: 1u; CBG151-200: 2u; CBG201-250: 3u; CBG251-300: 5u; CBG301-350: 7u; CBG351-400: 9u Patient taking differently: Inject 1-9 Units into the skin See admin instructions. Inject 1-9 units into the skin three times a day before meals, per sliding scale: BGL 121-150 = 1 unit; 151-200 = 2 units; 201-250 = 3 units; 251-300 = 5 units; 301-350 = 7 units; 351-400 = 9 units 08/28/17  Yes Mikhail, [name], DO  insulin glargine (LANTUS) 100 UNIT/ML injection Inject 0.5 mLs (50 Units total) into the skin daily. Patient taking differently: Inject 50 Units into the skin daily before breakfast.  08/28/17  Yes Mikhail, [name], DO  latanoprost (XALATAN) 0.005 % ophthalmic solution Place 1 drop into both eyes at bedtime. 03/03/17  Yes [provider]  losartan-hydrochlorothiazide (HYZAAR) 100-25 MG tablet Take 1 tablet by mouth daily. 07/15/15  Yes [provider]  Menthol, Topical Analgesic, (BLUE-EMU MAXIMUM STRENGTH EX) Apply 1 application topically See admin instructions. Apply to the back daily as directed for pain   Yes [provider]  metFORMIN (GLUCOPHAGE) 500 MG tablet Take 500 mg by mouth 2 (two) times daily with a meal.   Yes [provider]  metoprolol tartrate (LOPRESSOR) 100 MG tablet Take 1 tablet (100 mg total) by mouth 2 (two) times daily. 05/25/17  Yes [name], MD  rosuvastatin (CRESTOR) 10 MG tablet Take 10 mg by mouth daily. 08/19/17  Yes [provider]  Continuous Blood Gluc Receiver (Prichard) Hopkins 1 each by Does not apply route as directed. Use to check blood glucose 5 times daily 08/26/17   [name], MD  Continuous Blood Gluc Sensor (DEXCOM G6 SENSOR) MISC 1 each by Does not apply route as directed. Place 1 sensor subcutaneously once every 10 days. 08/26/17   [name], MD  Continuous Blood Gluc Transmit (DEXCOM G6 TRANSMITTER) MISC 1 each by Does not  apply route as directed. Use to check blood glucose 5 times daily. Refill every 3 months. 08/26/17   [name], MD  Insulin Pen Needle (PEN NEEDLES) 32G X 4 MM MISC Inject 90 each 3 (three) times daily into the skin. 02/23/17   [name], MD  metoCLOPramide (REGLAN) 5 MG tablet Take 1 tablet (5 mg total) by mouth 4 (four) times daily -  before meals and at bedtime. 08/28/17   Mikhail, [name], DO  oxyCODONE 10 MG TABS Take 1 tablet (10 mg total) by mouth every 4 (four) hours as needed for severe pain ((score 7 to 10)). Patient not taking: Reported on 09/04/2017 05/08/17   Newman Pies, MD  rosuvastatin (CRESTOR) 20 MG tablet Take 1 tablet (20 mg total) daily by mouth. Patient not taking: Reported on 09/04/2017 02/23/17   [name], MD    Physical Exam: Vitals:   09/04/17 2100 09/04/17 2106 09/04/17 2145 09/04/17 2206  BP: 110/67 99/66 127/70 137/83  Pulse: 79 79 82 85  Resp:  11  13  Temp:  99.8 F (37.7 C)  (!) 100.8 F (38.2 C)  TempSrc:  Oral  Oral  SpO2: 97% 100% 97% 94%  Weight:      Height:        GENERAL: 52 y.o.-year-old male patient. Pale, well-developed, well-nourished lying in the bed in no acute distress.  Pleasant and cooperative.  Generalized weakness HEENT: Head atraumatic, normocephalic. Pupils equal. Mucus membranes moist. NECK: Supple. No JVD. CHEST: Normal breath sounds bilaterally. No wheezing, rales, rhonchi or crackles. No use of accessory muscles of respiration.  No reproducible chest wall tenderness.  CARDIOVASCULAR: S1, S2 normal. No murmurs, rubs, or gallops. Cap refill <2 seconds. Pulses intact distally.  ABDOMEN: Soft, nondistended, nontender. No rebound, guarding, rigidity. Normoactive bowel sounds present in all four quadrants.  EXTREMITIES: Left foot with diffuse edema, 4th digit is slightly dusky but there are good pulses and the foot is warm. Left heel ulcer.  No calf tenderness or Homan's sign.  NEUROLOGIC: The patient is alert and  oriented x 3. Cranial nerves II through XII are grossly intact with no focal sensorimotor deficit. PSYCHIATRIC:  Normal affect, mood, thought content. SKIN: Warm, dry, and intact without obvious rash, lesion, or ulcer.    Labs on Admission:  CBC: Recent Labs  Lab 09/04/17 1745  WBC 16.7*  NEUTROABS 14.9*  HGB 6.1*  HCT 18.7*  MCV 84.2  PLT 101   Basic Metabolic Panel: Recent Labs  Lab 09/04/17 1745  NA 130*  K 3.7  CL 90*  CO2 27  GLUCOSE 258*  BUN 23*  CREATININE 2.22*  CALCIUM 8.3*   GFR: Estimated Creatinine Clearance: 40.5 mL/min (A) (by C-G formula based on SCr of  2.22 mg/dL (H)). Liver Function Tests: Recent Labs  Lab 09/04/17 1745  AST 18  ALT 8*  ALKPHOS 92  BILITOT 0.7  PROT 5.7*  ALBUMIN 1.4*   No results for input(s): LIPASE, AMYLASE in the last 168 hours. No results for input(s): AMMONIA in the last 168 hours. Coagulation Profile: No results for input(s): INR, PROTIME in the last 168 hours. Cardiac Enzymes: No results for input(s): CKTOTAL, CKMB, CKMBINDEX, TROPONINI in the last 168 hours. BNP (last 3 results) No results for input(s): PROBNP in the last 8760 hours. HbA1C: No results for input(s): HGBA1C in the last 72 hours. CBG: Recent Labs  Lab 09/04/17 1743  GLUCAP 259*   Lipid Profile: No results for input(s): CHOL, HDL, LDLCALC, TRIG, CHOLHDL, LDLDIRECT in the last 72 hours. Thyroid Function Tests: No results for input(s): TSH, T4TOTAL, FREET4, T3FREE, THYROIDAB in the last 72 hours. Anemia Panel: No results for input(s): VITAMINB12, FOLATE, FERRITIN, TIBC, IRON, RETICCTPCT in the last 72 hours. Urine analysis:    Component Value Date/Time   COLORURINE YELLOW (A) 04/29/2017 1907   APPEARANCEUR HAZY (A) 04/29/2017 1907   LABSPEC 1.018 04/29/2017 1907   PHURINE 5.0 04/29/2017 1907   GLUCOSEU NEGATIVE 04/29/2017 1907   HGBUR SMALL (A) 04/29/2017 1907   BILIRUBINUR NEGATIVE 04/29/2017 1907   KETONESUR NEGATIVE 04/29/2017 1907    PROTEINUR >=300 (A) 04/29/2017 1907   UROBILINOGEN 0.2 10/22/2006 0948   NITRITE NEGATIVE 04/29/2017 1907   LEUKOCYTESUR NEGATIVE 04/29/2017 1907   Sepsis Labs: @LABRCNTIP (procalcitonin:4,lacticidven:4) )No results found for this or any previous visit (from the past 240 hour(s)).   Radiological Exams on Admission: Dg Sacrum/coccyx  Result Date: 09/04/2017 CLINICAL DATA:  Fall with sacral pain EXAM: SACRUM AND COCCYX - 2+ VIEW COMPARISON:  None. FINDINGS: There is no evidence of fracture or other focal bone lesions. IMPRESSION: Negative. Electronically Signed   By: Donavan Foil M.D.   On: 09/04/2017 19:59   Dg Chest Port 1 View  Result Date: 09/04/2017 CLINICAL DATA:  Sepsis EXAM: PORTABLE CHEST 1 VIEW COMPARISON:  09/04/2015 CXR FINDINGS: The heart size and mediastinal contours are within normal limits. Minimal aortic atherosclerosis is noted. Both lungs are clear. ACDF hardware across the C5-6 interspace. The visualized skeletal structures are unremarkable. IMPRESSION: No active disease. Electronically Signed   By: Ashley Royalty M.D.   On: 09/04/2017 18:30   Dg Foot 2 Views Left  Result Date: 09/04/2017 CLINICAL DATA:  Sepsis EXAM: LEFT FOOT - 2 VIEW COMPARISON:  08/23/2017 radiographs and CT FINDINGS: Soft tissue wound along the plantar aspect of the heel. Plantar calcaneal cortical irregularity is stable subjacent to skin ulceration. There is persistent soft tissue swelling along the plantar aspect of the mid and hindfoot though less so since prior. Interval increase in soft tissue swelling is noted of the forefoot however since prior. Lucencies along the plantar aspect of the foot may represent soft tissue emphysema. Remote calcaneal fracture with healing redemonstrated. Degenerative joint space narrowing of DIP and PIP joints of the second through fifth digits, first through fifth MTP articulations and midfoot. IMPRESSION: 1. Redemonstration of large soft tissue defect along the plantar  aspect of the calcaneus with cortical irregularity and osteopenia subjacent to the skin wound compatible with chronic changes of osteomyelitis possibly acute on chronic. No significant bony changes are identified. 2. Soft tissue swelling along the plantar aspect of the mid and hindfoot persists with subcutaneous soft tissue emphysema suggested with decreased swelling along the plantar and dorsal aspect of the  foot but increased along the dorsum and plantar aspect of the forefoot. Electronically Signed   By: Ashley Royalty M.D.   On: 09/04/2017 18:37    Assessment/Plan  This is a 52 y.o. male with a history of CAD, DM, GERD, HTN, HLD, left sided diabetic foot ulcer with chronic osteo now being admitted with:  #. Sepsis likely secondary to osteomyelitis - Admit to inpatient stepdown  - IV antibiotics: Zosyn, Vanco - IV fluid hydration - Follow up cultures - Trend lactate and repeat CBC in am.  - Hold antihypertensives 2/2 hypotension - MRI left foot ordered by EDP - CCM and ortho consultations have been requested  #. Acute kidney injury  - IV fluids and repeat BMP in AM.  - Avoid nephrotoxic medications  #. Back pain s/p fall - Consider MRI to evaluate once more stable.  #. Anemia, normocytic, concern for GI bleed - Transfusing 2 units pRBCs - Check iron, B12, folate, FOBTx3 - NPO after midnight. - Hold aspirin for now  #. H/o Diabetes - Accuchecks achs with RISS coverage - Heart healthy, carb controlled diet - Hold metformin  #. History of HTN - Hold antihypertensives at this time 2/2 hypotension  #. History of HLD - Continue Crestor  Admission status: Inpatient, stepdown IV Fluids: NS Diet/Nutrition: NPO after midnight Consults called: CCM, ortho  DVT Px: SCDs and early ambulation. Code Status: Full Code  Disposition Plan: To home in 2-3 days  All the records are reviewed and case discussed with ED provider. Management plans discussed with the patient and/or family who  express understanding and agree with plan of care.  Alexis Hugelmeyer D.O. on 09/04/2017 at 10:28 PM CC: Primary care physician; [name], MD   09/04/2017, 10:28 PM

## 2017-09-04 NOTE — ED Triage Notes (Signed)
Pt arrives via ems from home with reports of fall. Pt became weak, went down to knees. Unknown LOC. Pt pale on arrival. BP on scene 50 systolic. Alert/oriented X4. Pt with pain to tailbone X3 weeks. Wound to L foot. Hx DM. Recently admitted to [facility] for DKA. CBG 489, BP 65/33, HR 67, 98% RA. Pt in NAD upon arrival.

## 2017-09-04 NOTE — ED Provider Notes (Signed)
Pt comes in with hypotension. Patient is febrile and pale appearing.  He has history of diabetes and a wound in his foot. Concerns for septic shock -sepsis treatment pathway initiated. Patient's hemoglobin is low, transfusion started.  Hemoccult negative. Patient is now status post 3.5 L of fluid, 3 L in the ER 500 cc by EMS -and his pressure is better.  1 unit of PRBC also given at the time of reassessment.  Vitals:   09/04/17 1930 09/04/17 2025  BP: (!) 92/56 125/74  Pulse: 78 82  Resp: 14 16  Temp:  99.4 F (37.4 C)  SpO2: 99% 99%    BP is improved.  Patient has received broad-spectrum antibiotics.  We will admit.  Suspicion is high for osteomyelitis.   CRITICAL CARE Performed by: Ankit Nanavati   Total critical care time: 38 minutes  Critical care time was exclusive of separately billable procedures and treating other patients.  Critical care was necessary to treat or prevent imminent or life-threatening deterioration.  Critical care was time spent personally by me on the following activities: development of treatment plan with patient and/or surrogate as well as nursing, discussions with consultants, evaluation of patient's response to treatment, examination of patient, obtaining history from patient or surrogate, ordering and performing treatments and interventions, ordering and review of laboratory studies, ordering and review of radiographic studies, pulse oximetry and re-evaluation of patient's condition.    Varney Biles, MD 09/04/17 2045

## 2017-09-04 NOTE — Progress Notes (Signed)
Pharmacy Code Sepsis Protocol  Time of code sepsis page: 17:46 [x]  Antibiotics delivered at 18:02 []  Antibiotics administered prior to code at  (if checked, omit next 2 questions)  Were antibiotics ordered at the time of the code sepsis page? Yes Was it required to contact the physician? [x]  Physician not contacted []  Physician contacted to order antibiotics for code sepsis []  Physician contacted to recommend changing antibiotics  Pharmacy consulted for: Vancomycin/Zosyn  Anti-infectives (From admission, onward)   Start     Dose/Rate Route Frequency Ordered Stop   09/04/17 1800  piperacillin-tazobactam (ZOSYN) IVPB 3.375 g     3.375 g 100 mL/hr over 30 Minutes Intravenous  Once 09/04/17 1746     09/04/17 1800  vancomycin (VANCOCIN) IVPB 1000 mg/200 mL premix     1,000 mg 200 mL/hr over 60 Minutes Intravenous  Once 09/04/17 1746          Nurse education provided: []  Minutes left to administer antibiotics to achieve 1 hour goal []  Correct order of antibiotic administration []  Antibiotic Y-site compatibilities    Patient was here previously on 5/12 with nausea and vomiting.  He has a history of chronic foot wound due to diabetes that he has been having I&D on for the last 3-4 week in [facility].  Today he comes in after a fall from becoming weak.  He was hypotensive, with a lactic acid of 4.44 and a WBC of 16.7K.  Cultures have been obtained.  He has an estimated crcl of 42ml/min with a weight of ~ 85kg. and height of 71 inches.  Plan: Will give additional 750mg  of IV Vancomycin for a total of 1750mg . - Begin a daily maintenance dose of 1gm every 12 hours - Give Zosyn 3.375mg  IV x 1 in the ED and then begin extended interval dosing of Zosyn 3.375gm every 8 hours. - Monitor culture data, renal function and clinical response - Will check steady state level as appropriate.  Rober Minion, PharmD., MS Clinical Pharmacist Pager:  [phone] Thank you for allowing pharmacy to be  part of this patients care team. 09/04/2017, 6:05 PM

## 2017-09-04 NOTE — ED Notes (Signed)
MD notified of low HGB 

## 2017-09-04 NOTE — ED Notes (Signed)
Notified KEubanks NP of fever

## 2017-09-05 ENCOUNTER — Inpatient Hospital Stay (HOSPITAL_COMMUNITY): Payer: BLUE CROSS/BLUE SHIELD

## 2017-09-05 ENCOUNTER — Inpatient Hospital Stay (HOSPITAL_COMMUNITY): Payer: BLUE CROSS/BLUE SHIELD | Admitting: Certified Registered Nurse Anesthetist

## 2017-09-05 ENCOUNTER — Encounter: Payer: Self-pay | Admitting: Family Medicine

## 2017-09-05 ENCOUNTER — Other Ambulatory Visit: Payer: Self-pay

## 2017-09-05 ENCOUNTER — Encounter (HOSPITAL_COMMUNITY)
Admission: EM | Disposition: A | Discharge status: Rehabilitation Facility | Payer: Self-pay | Source: Home / Self Care | Attending: Internal Medicine

## 2017-09-05 ENCOUNTER — Encounter: Payer: Self-pay | Admitting: Internal Medicine

## 2017-09-05 ENCOUNTER — Encounter (HOSPITAL_COMMUNITY): Payer: Self-pay | Admitting: Radiology

## 2017-09-05 HISTORY — PX: AMPUTATION: SHX166

## 2017-09-05 LAB — COMPREHENSIVE METABOLIC PANEL WITH GFR
ALT: 13 U/L — ABNORMAL LOW (ref 17–63)
AST: 23 U/L (ref 15–41)
Albumin: 1.5 g/dL — ABNORMAL LOW (ref 3.5–5.0)
Alkaline Phosphatase: 107 U/L (ref 38–126)
Anion gap: 10 (ref 5–15)
BUN: 22 mg/dL — ABNORMAL HIGH (ref 6–20)
CO2: 28 mmol/L (ref 22–32)
Calcium: 8.4 mg/dL — ABNORMAL LOW (ref 8.9–10.3)
Chloride: 91 mmol/L — ABNORMAL LOW (ref 101–111)
Creatinine, Ser: 1.68 mg/dL — ABNORMAL HIGH (ref 0.61–1.24)
GFR calc Af Amer: 52 mL/min — ABNORMAL LOW
GFR calc non Af Amer: 45 mL/min — ABNORMAL LOW
Glucose, Bld: 113 mg/dL — ABNORMAL HIGH (ref 65–99)
Potassium: 3.5 mmol/L (ref 3.5–5.1)
Sodium: 129 mmol/L — ABNORMAL LOW (ref 135–145)
Total Bilirubin: 1.8 mg/dL — ABNORMAL HIGH (ref 0.3–1.2)
Total Protein: 5.8 g/dL — ABNORMAL LOW (ref 6.5–8.1)

## 2017-09-05 LAB — URINALYSIS, ROUTINE W REFLEX MICROSCOPIC
Bacteria, UA: NONE SEEN
Bilirubin Urine: NEGATIVE
Glucose, UA: >=500 mg/dL — AB
Ketones, ur: 5 mg/dL — AB
Leukocytes, UA: NEGATIVE
Nitrite: NEGATIVE
Protein, ur: 100 mg/dL — AB
Specific Gravity, Urine: 1.015 (ref 1.005–1.030)
pH: 5 (ref 5.0–8.0)

## 2017-09-05 LAB — PHOSPHORUS: Phosphorus: 2 mg/dL — ABNORMAL LOW (ref 2.5–4.6)

## 2017-09-05 LAB — CBC
HCT: 24.7 % — ABNORMAL LOW (ref 39.0–52.0)
HCT: 28.1 % — ABNORMAL LOW (ref 39.0–52.0)
Hemoglobin: 8.5 g/dL — ABNORMAL LOW (ref 13.0–17.0)
Hemoglobin: 9.6 g/dL — ABNORMAL LOW (ref 13.0–17.0)
MCH: 28.2 pg (ref 26.0–34.0)
MCH: 28.6 pg (ref 26.0–34.0)
MCHC: 34.2 g/dL (ref 30.0–36.0)
MCHC: 34.4 g/dL (ref 30.0–36.0)
MCV: 82.6 fL (ref 78.0–100.0)
MCV: 83.2 fL (ref 78.0–100.0)
Platelets: 263 10*3/uL (ref 150–400)
Platelets: 286 10*3/uL (ref 150–400)
RBC: 2.97 MIL/uL — ABNORMAL LOW (ref 4.22–5.81)
RBC: 3.4 MIL/uL — ABNORMAL LOW (ref 4.22–5.81)
RDW: 12.9 % (ref 11.5–15.5)
RDW: 13.1 % (ref 11.5–15.5)
WBC: 18.1 10*3/uL — ABNORMAL HIGH (ref 4.0–10.5)
WBC: 19.6 10*3/uL — ABNORMAL HIGH (ref 4.0–10.5)

## 2017-09-05 LAB — GLUCOSE, CAPILLARY
Glucose-Capillary: 97 mg/dL (ref 65–99)
Glucose-Capillary: 167 mg/dL — ABNORMAL HIGH (ref 65–99)

## 2017-09-05 LAB — CORTISOL: Cortisol, Plasma: 12.7 ug/dL

## 2017-09-05 LAB — PROCALCITONIN
Procalcitonin: 1.89 ng/mL
Procalcitonin: 34.5 ng/mL

## 2017-09-05 LAB — CBG MONITORING, ED
Glucose-Capillary: 105 mg/dL — ABNORMAL HIGH (ref 65–99)
Glucose-Capillary: 120 mg/dL — ABNORMAL HIGH (ref 65–99)
Glucose-Capillary: 129 mg/dL — ABNORMAL HIGH (ref 65–99)
Glucose-Capillary: 90 mg/dL (ref 65–99)

## 2017-09-05 LAB — FIBRINOGEN: Fibrinogen: >800 mg/dL — ABNORMAL HIGH (ref 210–475)

## 2017-09-05 LAB — IRON AND TIBC
Iron: 38 ug/dL — ABNORMAL LOW (ref 45–182)
Saturation Ratios: 30 % (ref 17.9–39.5)
TIBC: 126 ug/dL — ABNORMAL LOW (ref 250–450)
UIBC: 88 ug/dL

## 2017-09-05 LAB — PROTIME-INR
INR: 1.32
Prothrombin Time: 16.2 seconds — ABNORMAL HIGH (ref 11.4–15.2)

## 2017-09-05 LAB — LACTIC ACID, PLASMA
Lactic Acid, Venous: 1.2 mmol/L (ref 0.5–1.9)
Lactic Acid, Venous: 1.1 mmol/L (ref 0.5–1.9)

## 2017-09-05 LAB — RETICULOCYTES
RBC.: 3.01 MIL/uL — ABNORMAL LOW (ref 4.22–5.81)
Retic Count, Absolute: 48.2 10*3/uL (ref 19.0–186.0)
Retic Ct Pct: 1.6 % (ref 0.4–3.1)

## 2017-09-05 LAB — SAVE SMEAR

## 2017-09-05 LAB — APTT: aPTT: 31 seconds (ref 24–36)

## 2017-09-05 LAB — FERRITIN: Ferritin: 441 ng/mL — ABNORMAL HIGH (ref 24–336)

## 2017-09-05 LAB — LACTATE DEHYDROGENASE: LDH: 133 U/L (ref 98–192)

## 2017-09-05 LAB — VITAMIN B12: Vitamin B-12: 334 pg/mL (ref 180–914)

## 2017-09-05 LAB — MAGNESIUM: Magnesium: 1.6 mg/dL — ABNORMAL LOW (ref 1.7–2.4)

## 2017-09-05 SURGERY — AMPUTATION BELOW KNEE
Anesthesia: General | Site: Leg Lower | Laterality: Left

## 2017-09-05 MED ORDER — ONDANSETRON HCL 4 MG/2ML IJ SOLN
INTRAMUSCULAR | Status: AC
  Filled 2017-09-05: qty 2

## 2017-09-05 MED ORDER — DEXAMETHASONE SODIUM PHOSPHATE 10 MG/ML IJ SOLN
INTRAMUSCULAR | Status: AC
  Filled 2017-09-05: qty 1

## 2017-09-05 MED ORDER — ACETAMINOPHEN 325 MG PO TABS
325.0000 mg | ORAL_TABLET | Freq: Four times a day (QID) | ORAL | Status: DC | PRN
  Filled 2017-09-05: qty 2

## 2017-09-05 MED ORDER — PANTOPRAZOLE SODIUM 40 MG PO TBEC
40.0000 mg | DELAYED_RELEASE_TABLET | Freq: Every day | ORAL | Status: DC
  Administered 2017-09-05 – 2017-09-09 (×5): 40 mg via ORAL
  Filled 2017-09-05 (×5): qty 1

## 2017-09-05 MED ORDER — HYDROMORPHONE HCL 1 MG/ML IJ SOLN
0.5000 mg | INTRAMUSCULAR | Status: DC | PRN
  Administered 2017-09-06 (×2): 1 mg via INTRAVENOUS
  Filled 2017-09-05 (×2): qty 1

## 2017-09-05 MED ORDER — ONDANSETRON HCL 4 MG PO TABS: 4.0000 mg | ORAL_TABLET | Freq: Four times a day (QID) | ORAL | Status: DC | PRN

## 2017-09-05 MED ORDER — ASPIRIN EC 81 MG PO TBEC
81.0000 mg | DELAYED_RELEASE_TABLET | Freq: Every day | ORAL | Status: DC
  Administered 2017-09-06 – 2017-09-09 (×4): 81 mg via ORAL
  Filled 2017-09-05 (×4): qty 1

## 2017-09-05 MED ORDER — HEPARIN SODIUM (PORCINE) 5000 UNIT/ML IJ SOLN
5000.0000 [IU] | Freq: Three times a day (TID) | INTRAMUSCULAR | Status: DC
  Administered 2017-09-05 – 2017-09-09 (×11): 5000 [IU] via SUBCUTANEOUS
  Filled 2017-09-05 (×11): qty 1

## 2017-09-05 MED ORDER — PHENYLEPHRINE 40 MCG/ML (10ML) SYRINGE FOR IV PUSH (FOR BLOOD PRESSURE SUPPORT)
PREFILLED_SYRINGE | INTRAVENOUS | Status: AC
  Filled 2017-09-05: qty 10

## 2017-09-05 MED ORDER — SUCCINYLCHOLINE CHLORIDE 200 MG/10ML IV SOSY
PREFILLED_SYRINGE | INTRAVENOUS | Status: AC
  Filled 2017-09-05: qty 10

## 2017-09-05 MED ORDER — PHENYLEPHRINE HCL 10 MG/ML IJ SOLN
INTRAMUSCULAR | Status: DC | PRN
  Administered 2017-09-05 (×2): 80 ug via INTRAVENOUS

## 2017-09-05 MED ORDER — 0.9 % SODIUM CHLORIDE (POUR BTL) OPTIME
TOPICAL | Status: DC | PRN
  Administered 2017-09-05: 1000 mL

## 2017-09-05 MED ORDER — DOCUSATE SODIUM 100 MG PO CAPS
100.0000 mg | ORAL_CAPSULE | Freq: Two times a day (BID) | ORAL | Status: DC
  Administered 2017-09-05 – 2017-09-07 (×4): 100 mg via ORAL
  Filled 2017-09-05 (×6): qty 1

## 2017-09-05 MED ORDER — VANCOMYCIN HCL IN DEXTROSE 750-5 MG/150ML-% IV SOLN
750.0000 mg | Freq: Two times a day (BID) | INTRAVENOUS | Status: AC
  Administered 2017-09-05 – 2017-09-07 (×5): 750 mg via INTRAVENOUS
  Filled 2017-09-05 (×5): qty 150

## 2017-09-05 MED ORDER — OXYCODONE HCL 5 MG PO TABS: 5.0000 mg | ORAL_TABLET | ORAL | Status: DC | PRN

## 2017-09-05 MED ORDER — SODIUM CHLORIDE 0.9 % IV BOLUS: 500.0000 mL | Freq: Once | INTRAVENOUS | Status: DC

## 2017-09-05 MED ORDER — OXYCODONE HCL 5 MG PO TABS
10.0000 mg | ORAL_TABLET | ORAL | Status: DC | PRN
  Administered 2017-09-06 (×2): 15 mg via ORAL
  Filled 2017-09-05 (×2): qty 3

## 2017-09-05 MED ORDER — METOCLOPRAMIDE HCL 5 MG/ML IJ SOLN: 5.0000 mg | Freq: Three times a day (TID) | INTRAMUSCULAR | Status: DC | PRN

## 2017-09-05 MED ORDER — ONDANSETRON HCL 4 MG/2ML IJ SOLN
INTRAMUSCULAR | Status: DC | PRN
  Administered 2017-09-05: 4 mg via INTRAVENOUS

## 2017-09-05 MED ORDER — PROPOFOL 10 MG/ML IV BOLUS
INTRAVENOUS | Status: AC
  Filled 2017-09-05: qty 20

## 2017-09-05 MED ORDER — PROPOFOL 10 MG/ML IV BOLUS
INTRAVENOUS | Status: DC | PRN
  Administered 2017-09-05: 200 mg via INTRAVENOUS

## 2017-09-05 MED ORDER — MIDAZOLAM HCL 2 MG/2ML IJ SOLN
INTRAMUSCULAR | Status: DC | PRN
  Administered 2017-09-05: 2 mg via INTRAVENOUS

## 2017-09-05 MED ORDER — TIMOLOL MALEATE 0.5 % OP SOLN
1.0000 [drp] | Freq: Two times a day (BID) | OPHTHALMIC | Status: DC
  Administered 2017-09-05 – 2017-09-09 (×5): 1 [drp] via OPHTHALMIC
  Filled 2017-09-05: qty 5

## 2017-09-05 MED ORDER — MIDAZOLAM HCL 2 MG/2ML IJ SOLN
INTRAMUSCULAR | Status: AC
  Filled 2017-09-05: qty 2

## 2017-09-05 MED ORDER — LIDOCAINE 2% (20 MG/ML) 5 ML SYRINGE
INTRAMUSCULAR | Status: AC
  Filled 2017-09-05: qty 5

## 2017-09-05 MED ORDER — FENTANYL CITRATE (PF) 250 MCG/5ML IJ SOLN
INTRAMUSCULAR | Status: AC
  Filled 2017-09-05: qty 5

## 2017-09-05 MED ORDER — ONDANSETRON HCL 4 MG/2ML IJ SOLN: 4.0000 mg | Freq: Four times a day (QID) | INTRAMUSCULAR | Status: DC | PRN

## 2017-09-05 MED ORDER — FENTANYL CITRATE (PF) 250 MCG/5ML IJ SOLN
INTRAMUSCULAR | Status: DC | PRN
  Administered 2017-09-05: 25 ug via INTRAVENOUS
  Administered 2017-09-05 (×2): 50 ug via INTRAVENOUS
  Administered 2017-09-05: 25 ug via INTRAVENOUS

## 2017-09-05 MED ORDER — MAGNESIUM SULFATE 2 GM/50ML IV SOLN
2.0000 g | Freq: Once | INTRAVENOUS | Status: AC
  Administered 2017-09-05: 2 g via INTRAVENOUS
  Filled 2017-09-05: qty 50

## 2017-09-05 MED ORDER — BRIMONIDINE TARTRATE 0.2 % OP SOLN
1.0000 [drp] | Freq: Two times a day (BID) | OPHTHALMIC | Status: DC
  Administered 2017-09-09: 1 [drp] via OPHTHALMIC
  Filled 2017-09-05 (×2): qty 5

## 2017-09-05 MED ORDER — LIDOCAINE 2% (20 MG/ML) 5 ML SYRINGE
INTRAMUSCULAR | Status: DC | PRN
  Administered 2017-09-05: 60 mg via INTRAVENOUS

## 2017-09-05 MED ORDER — METOCLOPRAMIDE HCL 10 MG PO TABS: 5.0000 mg | ORAL_TABLET | Freq: Three times a day (TID) | ORAL | Status: DC | PRN

## 2017-09-05 MED ORDER — ACETAMINOPHEN 500 MG PO TABS
1000.0000 mg | ORAL_TABLET | Freq: Four times a day (QID) | ORAL | Status: AC
  Administered 2017-09-05: 1000 mg via ORAL
  Administered 2017-09-06: 500 mg via ORAL
  Administered 2017-09-06 (×2): 1000 mg via ORAL
  Filled 2017-09-05 (×4): qty 2

## 2017-09-05 MED ORDER — ROCURONIUM BROMIDE 10 MG/ML (PF) SYRINGE
PREFILLED_SYRINGE | INTRAVENOUS | Status: AC
  Filled 2017-09-05: qty 5

## 2017-09-05 MED ORDER — PHENYLEPHRINE HCL 10 MG/ML IJ SOLN
INTRAVENOUS | Status: DC | PRN
  Administered 2017-09-05: 50 ug/min via INTRAVENOUS

## 2017-09-05 MED ORDER — FAMOTIDINE 20 MG PO TABS
20.0000 mg | ORAL_TABLET | Freq: Two times a day (BID) | ORAL | Status: DC
  Administered 2017-09-05 – 2017-09-09 (×8): 20 mg via ORAL
  Filled 2017-09-05 (×8): qty 1

## 2017-09-05 SURGICAL SUPPLY — 52 items
BANDAGE ELASTIC 6 VELCRO ST LF (GAUZE/BANDAGES/DRESSINGS) ×1 IMPLANT
BANDAGE ESMARK 6X9 LF (GAUZE/BANDAGES/DRESSINGS) ×1 IMPLANT
BLADE SAW RECIP 87.9 MT (BLADE) ×2 IMPLANT
BNDG CMPR 9X6 STRL LF SNTH (GAUZE/BANDAGES/DRESSINGS) ×1
BNDG COHESIVE 6X5 TAN STRL LF (GAUZE/BANDAGES/DRESSINGS) ×4 IMPLANT
BNDG ESMARK 6X9 LF (GAUZE/BANDAGES/DRESSINGS) ×2
BNDG GAUZE ELAST 4 BULKY (GAUZE/BANDAGES/DRESSINGS) ×1 IMPLANT
BNDG GAUZE STRTCH 6 (GAUZE/BANDAGES/DRESSINGS) IMPLANT
CHLORAPREP W/TINT 26ML (MISCELLANEOUS) ×2 IMPLANT
COVER SURGICAL LIGHT HANDLE (MISCELLANEOUS) ×2 IMPLANT
CUFF TOURNIQUET SINGLE 34IN LL (TOURNIQUET CUFF) IMPLANT
CUFF TOURNIQUET SINGLE 44IN (TOURNIQUET CUFF) IMPLANT
DRAIN PENROSE 1/2X12 LTX STRL (WOUND CARE) IMPLANT
DRAPE HALF SHEET 40X57 (DRAPES) ×6 IMPLANT
DRAPE U-SHAPE 47X51 STRL (DRAPES) ×4 IMPLANT
DRSG ADAPTIC 3X8 NADH LF (GAUZE/BANDAGES/DRESSINGS) ×2 IMPLANT
ELECT CAUTERY BLADE 6.4 (BLADE) IMPLANT
ELECT REM PT RETURN 9FT ADLT (ELECTROSURGICAL) ×2
ELECTRODE REM PT RTRN 9FT ADLT (ELECTROSURGICAL) ×1 IMPLANT
EVACUATOR 1/8 PVC DRAIN (DRAIN) ×1 IMPLANT
GAUZE SPONGE 4X4 12PLY STRL (GAUZE/BANDAGES/DRESSINGS) ×2 IMPLANT
GAUZE SPONGE 4X4 12PLY STRL LF (GAUZE/BANDAGES/DRESSINGS) ×1 IMPLANT
GLOVE BIO SURGEON STRL SZ7 (GLOVE) ×2 IMPLANT
GLOVE BIO SURGEON STRL SZ7.5 (GLOVE) ×2 IMPLANT
GLOVE BIOGEL PI IND STRL 8 (GLOVE) ×1 IMPLANT
GLOVE BIOGEL PI INDICATOR 8 (GLOVE) ×1
GOWN STRL REUS W/ TWL LRG LVL3 (GOWN DISPOSABLE) ×2 IMPLANT
GOWN STRL REUS W/ TWL XL LVL3 (GOWN DISPOSABLE) ×1 IMPLANT
GOWN STRL REUS W/TWL LRG LVL3 (GOWN DISPOSABLE) ×4
GOWN STRL REUS W/TWL XL LVL3 (GOWN DISPOSABLE) ×2
KIT BASIN OR (CUSTOM PROCEDURE TRAY) ×2 IMPLANT
KIT TURNOVER KIT B (KITS) ×2 IMPLANT
MANIFOLD NEPTUNE II (INSTRUMENTS) ×2 IMPLANT
NS IRRIG 1000ML POUR BTL (IV SOLUTION) ×2 IMPLANT
PACK GENERAL/GYN (CUSTOM PROCEDURE TRAY) ×2 IMPLANT
PAD ABD 8X10 STRL (GAUZE/BANDAGES/DRESSINGS) ×1 IMPLANT
PAD ARMBOARD 7.5X6 YLW CONV (MISCELLANEOUS) ×4 IMPLANT
PAD CAST 4YDX4 CTTN HI CHSV (CAST SUPPLIES) ×1 IMPLANT
PADDING CAST COTTON 4X4 STRL (CAST SUPPLIES) ×2
PADDING CAST COTTON 6X4 STRL (CAST SUPPLIES) ×2 IMPLANT
SPONGE LAP 18X18 X RAY DECT (DISPOSABLE) IMPLANT
STAPLER VISISTAT 35W (STAPLE) IMPLANT
STOCKINETTE IMPERVIOUS LG (DRAPES) IMPLANT
SUT PDS AB 1 CT  36 (SUTURE)
SUT PDS AB 1 CT 36 (SUTURE) IMPLANT
SUT SILK 2 0 (SUTURE) ×2
SUT SILK 2-0 18XBRD TIE 12 (SUTURE) ×1 IMPLANT
SWAB COLLECTION DEVICE MRSA (MISCELLANEOUS) IMPLANT
SWAB CULTURE ESWAB REG 1ML (MISCELLANEOUS) IMPLANT
TOWEL OR 17X24 6PK STRL BLUE (TOWEL DISPOSABLE) ×2 IMPLANT
TOWEL OR 17X26 10 PK STRL BLUE (TOWEL DISPOSABLE) ×2 IMPLANT
WATER STERILE IRR 1000ML POUR (IV SOLUTION) ×2 IMPLANT

## 2017-09-05 NOTE — Consult Note (Signed)
[place] Nurse wound consult note Reason for Consult: [facility] is simultaneously consulted with Orthopedic Surgery for left wound.  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   [place] nursing team will not follow, but will remain available to this patient, the nursing and medical teams.  Please re-consult if needed. Thanks, Maudie Flakes, MSN, RN, [place], Arther Abbott  Pager# [phone]

## 2017-09-05 NOTE — Progress Notes (Signed)
New Admission Note: Pt arrived from PACU  Arrival Method: Bed Mental Orientation: A&Ox4 Telemetry: Stepdown pt Assessment: Completed Skin: new Left BKA, MASD sacrum IV: #20 LFA; #20 RAC Pain: 7/10 and medicated per orders Tubes: Hemovac drain to left stump Safety Measures: Safety Fall Prevention Plan has been given, discussed and signed Admission: Completed [facility] Orientation: Patient has been orientated to the room, unit and staff.  Family: updated and at bedside  Orders have been reviewed and implemented. Will continue to monitor the patient. Call light has been placed within reach and bed alarm has been activated.   Chapman Fitch BSN, RN [employer] [facility] Phone number: [employer]

## 2017-09-05 NOTE — Progress Notes (Addendum)
PROGRESS NOTE        PATIENT DETAILS Name: Chase Clark Age: 52 y.o. Sex: male Date of Birth: October 05, 1965 Admit Date: 09/04/2017 Admitting Physician Harvie Bridge, DO YJE:HUDJSHFWYO, [name], MD  Brief Narrative: Patient is a 52 y.o. male history of chronic left heel foot ulcer, diabetes CAD status post PCI in 2007-presented to the hospital with weakness, near syncope, discharge from his chronic left foot ulcer-he was found to be hypotensive, and thought to have sepsis from left heel ulceration and underlying osteomyelitis.  See below for further details  Subjective: Does not feel good-but denies any chest pain, shortness of breath, nausea, vomiting or abdominal pain.  Assessment/Plan: Sepsis secondary to left diabetic foot/heel ulceration with underlying osteomyelitis: Blood pressure although improved, remains soft at times.  Febrile this morning and with persistent leukocytosis remains on empiric vancomycin and Zosyn-and IV fluid hydration.  Had discussed earlier with orthopedics (Dr. [name]) over the phone-recommendations were to continue with supportive care-Dr. Berenice Primas will see patient early next week plan for potential amputation.  However since MRI of the foot done earlier this morning, shows underlying osteomyelitis and gas in his soft tissue-we will go ahead and touch base with orthopedics again-awaiting callback.  Addendum 12:13 pm Spoke with Dr Carley Hammed MRI findings-soft BP etc-he advised NPO status-he will evaluate and provide recommendations  Anemia: Appears to have anemia secondary to chronic disease-probably due to chronic left heel ulcer at baseline, probably worse due to acute illness.  Denies any melena or hematochezia.  Transfused a total of 2 units of PRBC on admission, hemoglobin currently stable at 9.6.  Acute kidney injury on chronic kidney disease stage III: AKI hemodynamically mediated-improving with  hydration.  Insulin-dependent DM: CBG is stable-continue with Lantus 15 units daily, and SSI.  Follow and adjust accordingly metformin remains on hold  Back pain: Occur after fall-MRI lumbar spine does not show any acute abnormalities.  Supportive care.  Peripheral neuropathy: Probably related to diabetes-continue Neurontin  CAD: No anginal symptoms, resume aspirin  Hypertension: All antihypertensives on hold as BP soft.  Follow and resume accordingly  Hyperlipidemia: Continue statin  DVT Prophylaxis: Prophylactic Heparin   Code Status: Full code  Family Communication: Spouse at bedside  Disposition Plan: Remain inpatient-will require several more days of hospitalization prior to discharge  Antimicrobial agents: Anti-infectives (From admission, onward)   Start     Dose/Rate Route Frequency Ordered Stop   09/05/17 1900  vancomycin (VANCOCIN) IVPB 750 mg/150 ml premix     750 mg 150 mL/hr over 60 Minutes Intravenous Every 12 hours 09/05/17 1057     09/05/17 0700  vancomycin (VANCOCIN) IVPB 1000 mg/200 mL premix  Status:  Discontinued     1,000 mg 200 mL/hr over 60 Minutes Intravenous Every 12 hours 09/04/17 1828 09/05/17 1057   09/05/17 0300  piperacillin-tazobactam (ZOSYN) IVPB 3.375 g     3.375 g 12.5 mL/hr over 240 Minutes Intravenous Every 8 hours 09/04/17 1830     09/04/17 1830  vancomycin (VANCOCIN) IVPB 750 mg/150 ml premix     750 mg 150 mL/hr over 60 Minutes Intravenous STAT 09/04/17 1813 09/04/17 2204   09/04/17 1800  piperacillin-tazobactam (ZOSYN) IVPB 3.375 g     3.375 g 100 mL/hr over 30 Minutes Intravenous  Once 09/04/17 1746 09/04/17 1840   09/04/17 1800  vancomycin (VANCOCIN) IVPB 1000 mg/200 mL premix  1,000 mg 200 mL/hr over 60 Minutes Intravenous  Once 09/04/17 1746 09/04/17 2006      Procedures: None  CONSULTS:  pulmonary/intensive care  Time spent: 35  minutes-Greater than 50% of this time was spent in counseling, explanation of  diagnosis, planning of further management, and coordination of care.  MEDICATIONS: Scheduled Meds: . brimonidine  1 drop Right Eye BID   And  . timolol  1 drop Right Eye BID  . Difluprednate  1 drop Left Eye QHS  . famotidine  20 mg Oral BID  . gabapentin  600 mg Oral BID  . insulin aspart  0-15 Units Subcutaneous Q4H  . insulin glargine  50 Units Subcutaneous QAC breakfast  . latanoprost  1 drop Both Eyes QHS  . rosuvastatin  10 mg Oral Daily   Continuous Infusions: . sodium chloride 100 mL/hr at 09/05/17 0400  . lactated ringers Stopped (09/05/17 0300)  . piperacillin-tazobactam (ZOSYN)  IV Stopped (09/05/17 0800)  . sodium chloride    . vancomycin     PRN Meds:.acetaminophen **OR** acetaminophen, morphine injection, ondansetron **OR** ondansetron (ZOFRAN) IV, oxyCODONE, traMADol   PHYSICAL EXAM: Vital signs: Vitals:   09/05/17 0900 09/05/17 0930 09/05/17 0947 09/05/17 0951  BP:  119/66 125/76 123/76  Pulse:  94 96 97  Resp:   17 18  Temp: 100.1 F (37.8 C)     TempSrc:      SpO2:  100% 99% 100%  Weight:      Height:       Filed Weights   09/04/17 1742  Weight: 84.8 kg (187 lb)   Body mass index is 26.08 kg/m.   General appearance :Awake, alert, not in any distress HEENT: Atraumatic and Normocephalic Neck: supple Resp:Good air entry bilaterally, no added sounds  CVS: S1 S2 regular,tachy GI: Bowel sounds present, Non tender and not distended with no gaurding, rigidity or rebound.No organomegaly Extremities: B/L Lower Ext shows no edema, both legs are warm to touch.  Left foot-has a ulcer in the heel with some purulence present. Neurology:  speech clear,Non focal, sensation is grossly intact. Musculoskeletal:No digital cyanosis Skin:No Rash, warm and dry Wounds:N/A  I have personally reviewed following labs and imaging studies  LABORATORY DATA: CBC: Recent Labs  Lab 09/04/17 1745 09/04/17 2358 09/05/17 0625  WBC 16.7* 18.1* 19.6*  NEUTROABS 14.9*   --   --   HGB 6.1* 8.5* 9.6*  HCT 18.7* 24.7* 28.1*  MCV 84.2 83.2 82.6  PLT 299 286 161    Basic Metabolic Panel: Recent Labs  Lab 09/04/17 1745 09/04/17 2303 09/05/17 0625  NA 130*  --  129*  K 3.7  --  3.5  CL 90*  --  91*  CO2 27  --  28  GLUCOSE 258*  --  113*  BUN 23*  --  22*  CREATININE 2.22*  --  1.68*  CALCIUM 8.3*  --  8.4*  MG  --  1.6*  --   PHOS  --  2.0*  --     GFR: Estimated Creatinine Clearance: 53.5 mL/min (A) (by C-G formula based on SCr of 1.68 mg/dL (H)).  Liver Function Tests: Recent Labs  Lab 09/04/17 1745 09/05/17 0625  AST 18 23  ALT 8* 13*  ALKPHOS 92 107  BILITOT 0.7 1.8*  PROT 5.7* 5.8*  ALBUMIN 1.4* 1.5*   No results for input(s): LIPASE, AMYLASE in the last 168 hours. No results for input(s): AMMONIA in the last 168 hours.  Coagulation Profile:  Recent Labs  Lab 09/04/17 2358  INR 1.32    Cardiac Enzymes: No results for input(s): CKTOTAL, CKMB, CKMBINDEX, TROPONINI in the last 168 hours.  BNP (last 3 results) No results for input(s): PROBNP in the last 8760 hours.  HbA1C: No results for input(s): HGBA1C in the last 72 hours.  CBG: Recent Labs  Lab 09/04/17 1743 09/05/17 0007 09/05/17 0359 09/05/17 0742  GLUCAP 259* 90 105* 120*    Lipid Profile: No results for input(s): CHOL, HDL, LDLCALC, TRIG, CHOLHDL, LDLDIRECT in the last 72 hours.  Thyroid Function Tests: No results for input(s): TSH, T4TOTAL, FREET4, T3FREE, THYROIDAB in the last 72 hours.  Anemia Panel: Recent Labs    09/04/17 2303 09/04/17 2358  VITAMINB12 334  --   FERRITIN 441*  --   TIBC 126*  --   IRON 38*  --   RETICCTPCT  --  1.6    Urine analysis:    Component Value Date/Time   COLORURINE YELLOW 09/05/2017 0404   APPEARANCEUR HAZY (A) 09/05/2017 0404   LABSPEC 1.015 09/05/2017 0404   PHURINE 5.0 09/05/2017 0404   GLUCOSEU >=500 (A) 09/05/2017 0404   HGBUR MODERATE (A) 09/05/2017 0404   BILIRUBINUR NEGATIVE 09/05/2017 0404    KETONESUR 5 (A) 09/05/2017 0404   PROTEINUR 100 (A) 09/05/2017 0404   UROBILINOGEN 0.2 10/22/2006 0948   NITRITE NEGATIVE 09/05/2017 0404   LEUKOCYTESUR NEGATIVE 09/05/2017 0404    Sepsis Labs: Lactic Acid, Venous    Component Value Date/Time   LATICACIDVEN 1.1 09/05/2017 0017    MICROBIOLOGY: Recent Results (from the past 240 hour(s))  Blood Culture (routine x 2)     Status: None (Preliminary result)   Collection Time: 09/04/17  5:45 PM  Result Value Ref Range Status   Specimen Description BLOOD RIGHT ANTECUBITAL  Final   Special Requests   Final    BOTTLES DRAWN AEROBIC AND ANAEROBIC Blood Culture adequate volume   Culture   Final    NO GROWTH < 24 HOURS Performed at [facility], [place] [address], [place], [place] [address]    Report Status PENDING  Incomplete  Blood Culture (routine x 2)     Status: None (Preliminary result)   Collection Time: 09/04/17  6:04 PM  Result Value Ref Range Status   Specimen Description BLOOD LEFT FOREARM  Final   Special Requests   Final    BOTTLES DRAWN AEROBIC AND ANAEROBIC Blood Culture results may not be optimal due to an inadequate volume of blood received in culture bottles   Culture   Final    NO GROWTH < 24 HOURS Performed at [facility], [place] [address] [place], [place] [address]    Report Status PENDING  Incomplete    RADIOLOGY STUDIES/RESULTS: Dg Sacrum/coccyx  Result Date: 09/04/2017 CLINICAL DATA:  Fall with sacral pain EXAM: SACRUM AND COCCYX - 2+ VIEW COMPARISON:  None. FINDINGS: There is no evidence of fracture or other focal bone lesions. IMPRESSION: Negative. Electronically Signed   By: Donavan Foil M.D.   On: 09/04/2017 19:59   Dg Abd 1 View  Result Date: 08/24/2017 CLINICAL DATA:  Nausea and vomiting EXAM: ABDOMEN - 1 VIEW COMPARISON:  None. FINDINGS: Nonspecific decreased bowel gas. Small amount of gas in the transverse colon. Small amount of stool in the rectum. IMPRESSION: Nonspecific diffuse decreased  bowel gas with small amount of gas in the transverse colon. Electronically Signed   By: Donavan Foil M.D.   On: 08/24/2017 00:37  Mr Lumbar Spine Wo Contrast  Result Date: 09/05/2017 CLINICAL DATA:  Recent fall. Weakness. Diabetic. DKA. Foot osteomyelitis. EXAM: MRI LUMBAR SPINE WITHOUT CONTRAST TECHNIQUE: Multiplanar, multisequence MR imaging of the lumbar spine was performed. No intravenous contrast was administered. COMPARISON:  None. FINDINGS: Segmentation:  Standard. Alignment:  Anatomic. Vertebrae:  No worrisome osseous lesion. Conus medullaris and cauda equina: Conus extends to the L1. level. Conus and cauda equina appear normal. Paraspinal and other soft tissues: Marked bladder distention, query bladder outlet obstruction or neurogenic dysfunction. Disc levels: L1-L2:  Normal. L2-L3:  Normal. L3-L4:  Normal. L4-L5: Central and leftward protrusion. Facet arthropathy. Mild stenosis. Definite impingement not established. L5-S1: Annular bulge. Facet arthropathy. No impingement. In the ventral epidural space opposite S1, there is a T1 and T2 hypointense structure, probable venous plexus although a small caudally extruded disc fragment not completely excluded. No signs of epidural hematoma. No significant cauda equina compression. IMPRESSION: No lumbar compression fracture or traumatic subluxation. Central and leftward protrusion at L4-5 with mild stenosis, but no definite impingement. Annular bulge L5-S1. Small ventral epidural T1 and T2 hypointense structure behind S1, could represent caudally extruded disc fragment, but no significant cauda equina compression. Marked bladder distention, query bladder outlet obstruction or neurogenic dysfunction. Electronically Signed   By: Staci Righter M.D.   On: 09/05/2017 11:37   Ct Foot Left Wo Contrast  Result Date: 08/23/2017 CLINICAL DATA:  Diabetic patient with an ulceration on the left heel. EXAM: CT OF THE LEFT FOOT WITHOUT CONTRAST TECHNIQUE: Multidetector  CT imaging of the left foot was performed according to the standard protocol. Multiplanar CT image reconstructions were also generated. COMPARISON:  Plain films of the left foot earlier today and 07/10/2016. MRI left ankle 05/20/2016. FINDINGS: Bones/Joint/Cartilage The patient has a remote healed fracture of the posterior, superior calcaneus. Irregularity of cortical bone along the inferior margin of the calcaneus is noted but no cortical destructive change or periosteal reaction is identified. There is some sclerosis of the calcaneus eccentric toward the medial side. No fracture or dislocation. Ligaments Suboptimally assessed by CT. Muscles and Tendons Intact. Soft tissues Large skin ulceration over the heel is identified. No underlying fluid collection is present. No radiopaque foreign body or soft tissue gas. Subcutaneous edema about the ankle is noted. Atherosclerosis is noted. IMPRESSION: Skin ulceration on the heel without CT evidence of underlying osteomyelitis. Negative for abscess. Healed fracture of dorsal calcaneus. Atherosclerosis. Electronically Signed   By: Inge Rise M.D.   On: 08/23/2017 17:20   Mr Foot Left Wo Contrast  Result Date: 09/05/2017 CLINICAL DATA:  Chronic open wound along the left heel. Diffuse swelling in the foot extending into the toes. Diabetes. EXAM: MRI OF THE LEFT HINDFOOT WITHOUT CONTRAST TECHNIQUE: Multiplanar, multisequence MR imaging of the ankle was performed. No intravenous contrast was administered. COMPARISON:  09/04/2017 FINDINGS: TENDONS Peroneal: Mild tendinopathy of the peroneus brevis. Posteromedial: Unremarkable Anterior: Unremarkable Achilles: Moderate distal Achilles tendinopathy with expansion and increased signal in the tendon. Plantar Fascia: Prominent thickening of the medial and lateral band of the plantar fascia with poor visualization and attenuation of the connection to the plantar calcaneus. This does not represent a total rupture given the  apparent continuity on image 17/6, but may represent partial tearing. There is a soft tissue deficiency in this region overlying the plantar calcaneus. LIGAMENTS Lateral: Unremarkable although there appear to be fragmented osteophytes/ossicles deep to the anterior talofibular ligament on images 16-21 of series 4. Medial: Unremarkable CARTILAGE Ankle Joint: Unremarkable Subtalar  Joints/Sinus Tarsi: Unremarkable Bones: Abnormal edema compatible with active osteomyelitis involving the posterior calcaneus extending as far forward as the calcaneal neck. Other: Prominent loss of soft tissue thickness along the posterior and plantar portion of the posterior calcaneus compatible with notable ulceration down nearly to the periosteum. Diffuse subcutaneous edema in the ankle tracking into the foot especially dorsally. Speckled low densities in the soft tissues superficial to the plantar fascia and extending distally in the foot, compatible with gas tracking in the soft tissues. This extends at least to the distal metatarsal level. IMPRESSION: 1. 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 There is abnormal gas tracking along the plantar fascia and into the forefoot, extending at least as far as the level of the distal metatarsals, probably reflecting fasciitis. I do not see a drainable soft tissue abscess. 2. The osteomyelitis as undermined the plantar fascia connection to the calcaneus, which is thought to be significantly partially detached although not totally ruptured. 3. Moderate Achilles tendinopathy distally. 4. Abnormal edema in the ankle and foot suggesting cellulitis. 5. Mild peroneus brevis tendinopathy. Electronically Signed   By: Van Clines M.D.   On: 09/05/2017 08:08   Dg Chest Port 1 View  Result Date: 09/04/2017 CLINICAL DATA:  Sepsis EXAM: PORTABLE CHEST 1 VIEW COMPARISON:  09/04/2015 CXR FINDINGS: The heart size and mediastinal  contours are within normal limits. Minimal aortic atherosclerosis is noted. Both lungs are clear. ACDF hardware across the C5-6 interspace. The visualized skeletal structures are unremarkable. IMPRESSION: No active disease. Electronically Signed   By: Ashley Royalty M.D.   On: 09/04/2017 18:30   Dg Foot 2 Views Left  Result Date: 09/04/2017 CLINICAL DATA:  Sepsis EXAM: LEFT FOOT - 2 VIEW COMPARISON:  08/23/2017 radiographs and CT FINDINGS: Soft tissue wound along the plantar aspect of the heel. Plantar calcaneal cortical irregularity is stable subjacent to skin ulceration. There is persistent soft tissue swelling along the plantar aspect of the mid and hindfoot though less so since prior. Interval increase in soft tissue swelling is noted of the forefoot however since prior. Lucencies along the plantar aspect of the foot may represent soft tissue emphysema. Remote calcaneal fracture with healing redemonstrated. Degenerative joint space narrowing of DIP and PIP joints of the second through fifth digits, first through fifth MTP articulations and midfoot. IMPRESSION: 1. Redemonstration of large soft tissue defect along the plantar aspect of the calcaneus with cortical irregularity and osteopenia subjacent to the skin wound compatible with chronic changes of osteomyelitis possibly acute on chronic. No significant bony changes are identified. 2. Soft tissue swelling along the plantar aspect of the mid and hindfoot persists with subcutaneous soft tissue emphysema suggested with decreased swelling along the plantar and dorsal aspect of the foot but increased along the dorsum and plantar aspect of the forefoot. Electronically Signed   By: Ashley Royalty M.D.   On: 09/04/2017 18:37   Dg Foot 2 Views Left  Result Date: 08/23/2017 CLINICAL DATA:  Diabetic patient with a nonhealing wound on the left heel. The wound is chronic. EXAM: LEFT FOOT - 2 VIEW COMPARISON:  Plain films of the left foot 07/10/2016. FINDINGS: There is a  large skin wound on the plantar surface of the heel. Previously seen fracture of the dorsal calcaneus has healed. There is sclerosis in the calcaneus and irregularity of cortical bone deep to the patient's skin ulceration. No radiopaque foreign body. Atherosclerosis noted. IMPRESSION: Findings most consistent with acute on  chronic calcaneal osteomyelitis subjacent to a skin wound on the heel. Healed calcaneal fracture. Electronically Signed   By: Inge Rise M.D.   On: 08/23/2017 14:35     LOS: 1 day   Oren Binet, MD  [facility]  If 7PM-7AM, please contact night-coverage  Please page via [email or web address] [email or web address]-click on MD name and type text message  09/05/2017, 11:48 AM

## 2017-09-05 NOTE — ED Notes (Signed)
Nurse will call main lab and inquire about b12 and iron blood test.

## 2017-09-05 NOTE — Progress Notes (Signed)
Pt's sister at bedside. Pt is asleep and pt's O2 sats keeping dropping into the 80's while he is asleep. Placed pt on 2L of O2. Pt sats now 97 on 2L of O2. Will continue to monitor pt. Ranelle Oyster, RN

## 2017-09-05 NOTE — ED Notes (Signed)
Attempted report 

## 2017-09-05 NOTE — Consult Note (Addendum)
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 male.  HPI: Chase Clark  is a very pleasant26 year old diabetic male who has had a left foot wound now for quite a while.  He saw Dr. Berenice Primas a couple weeks ago and was clinically doing better.  They talked about wound care and possible amputation on an elective basis if things did not improve, but unfortunately the patient has become septic.  He feels ill.  He has been in the emergency department with hypotension requiring boluses.  He has been on antibiotics and the wound continues to get worse.  He recently had an MRI which shows gas in the fascial planes of the foot.  He has not had any progression of the leg.  Past Medical History:  Diagnosis Date  . Arthritis   . Asthma    as child  . Coronary artery disease    DES DIAG1 11/20/09 [facility])  . Diabetes mellitus without complication ([place])   . Diverticulosis   . Edema, lower extremity   . Fatty liver   . GERD (gastroesophageal reflux disease)   . Gilbert's disease   . Glaucoma   . Heart disease 2011   Patient has stent for 80% blockage  . Hyperlipidemia   . Hypertension   . Neuropathy   . Seasonal allergies   . Shoulder pain   . Ulcer of foot ([place]) 08.06.2014   DIABETIC ULCERATIONS ASSOCIATED WITH IRRITATION LATERAL ANKLE LEFT GREATER THAN RIGHT WITH MILD CELLULITIS    Past Surgical History:  Procedure Laterality Date  . ABDOMINAL AORTOGRAM N/A 05/20/2016   Procedure: Abdominal Aortogram possible intervention;  Surgeon: Katha Cabal, MD;  Location: [facility];  Service: Cardiovascular;  Laterality: N/A;  . ANTERIOR CERVICAL DECOMP/DISCECTOMY FUSION N/A 05/07/2017   Procedure: ANTERIOR CERVICAL DECOMPRESSION/DISCECTOMY Luevenia Maxin PROSTHESIS,PLATE/SCREWS CERVICAL FIVE - CERVICAL SIX;  Surgeon: Newman Pies, MD;  Location: [facility];  Service: Neurosurgery;  Laterality: N/A;  . CARDIAC  CATHETERIZATION    . CATARACT EXTRACTION W/ INTRAOCULAR LENS IMPLANT    . CATARACT EXTRACTION W/PHACO Left 02/26/2017   Procedure: CATARACT EXTRACTION PHACO AND INTRAOCULAR LENS PLACEMENT (IOC);  Surgeon: Eulogio Bear, MD;  Location: [facility];  Service: Ophthalmology;  Laterality: Left;  Lot # [id number] H [place]: 00:25.3 AP%: 6.2 CDE: 1.59   . CHOLECYSTECTOMY N/A   . CORONARY ANGIOPLASTY WITH STENT PLACEMENT  2007   1st diagonal  . EPIBLEPHERON REPAIR WITH TEAR DUCT PROBING    . EYE SURGERY Right sept 29n2015   cataract extraction  . INSERTION EXPRESS TUBE SHUNT Right 09/06/2015   Procedure: INSERTION AHMED TUBE SHUNT with tutoplast allograft;  Surgeon: Eulogio Bear, MD;  Location: [facility];  Service: Ophthalmology;  Laterality: Right;  Marland Kitchen VASECTOMY      Family History  Problem Relation Age of Onset  . Hyperlipidemia Mother   . Diabetes Mother   . Liver disease Mother        NASH  . Hyperlipidemia Father   . Diabetes Father   . Heart disease Father   . Hypertension Father     Social History:  reports that he quit smoking about 9 years ago. His smoking use included cigarettes. He has never used smokeless tobacco. He reports that he drinks alcohol. He reports that he does not use drugs.  Allergies:  Allergies  Allergen Reactions  . Atorvastatin Other (See Comments)    Muscle aches  Medications: I have reviewed the patient's current medications.  Results for orders placed or performed during the hospital encounter of 09/04/17 (from the past 48 hour(s))  CBG monitoring, ED     Status: Abnormal   Collection Time: 09/04/17  5:43 PM  Result Value Ref Range   Glucose-Capillary 259 (H) 65 - 99 mg/dL   Comment 1 Notify RN    Comment 2 Document in Chart   Comprehensive metabolic panel     Status: Abnormal   Collection Time: 09/04/17  5:45 PM  Result Value Ref Range   Sodium 130 (L) 135 - 145 mmol/L   Potassium 3.7 3.5 - 5.1 mmol/L   Chloride 90 (L) 101 - 111 mmol/L    CO2 27 22 - 32 mmol/L   Glucose, Bld 258 (H) 65 - 99 mg/dL   BUN 23 (H) 6 - 20 mg/dL   Creatinine, Ser 2.22 (H) 0.61 - 1.24 mg/dL   Calcium 8.3 (L) 8.9 - 10.3 mg/dL   Total Protein 5.7 (L) 6.5 - 8.1 g/dL   Albumin 1.4 (L) 3.5 - 5.0 g/dL   AST 18 15 - 41 U/L   ALT 8 (L) 17 - 63 U/L   Alkaline Phosphatase 92 38 - 126 U/L   Total Bilirubin 0.7 0.3 - 1.2 mg/dL   GFR calc non Af Amer 32 (L) >60 mL/min   GFR calc Af Amer 37 (L) >60 mL/min    Comment: (NOTE) The eGFR has been calculated using the CKD EPI equation. This calculation has not been validated in all clinical situations. eGFR's persistently <60 mL/min signify possible Chronic Kidney Disease.    Anion gap 13 5 - 15    Comment: Performed at [facility] [address], [place], [place] [address]  CBC WITH DIFFERENTIAL     Status: Abnormal   Collection Time: 09/04/17  5:45 PM  Result Value Ref Range   WBC 16.7 (H) 4.0 - 10.5 K/uL   RBC 2.22 (L) 4.22 - 5.81 MIL/uL   Hemoglobin 6.1 (LL) 13.0 - 17.0 g/dL    Comment: REPEATED TO VERIFY SPECIMEN CHECKED FOR CLOTS CRITICAL RESULT CALLED TO, READ BACK BY AND VERIFIED WITH: K WOODARD,RN 1818 09/04/2017 WBOND    HCT 18.7 (L) 39.0 - 52.0 %   MCV 84.2 78.0 - 100.0 fL   MCH 27.5 26.0 - 34.0 pg   MCHC 32.6 30.0 - 36.0 g/dL   RDW 12.7 11.5 - 15.5 %   Platelets 299 150 - 400 K/uL   Neutrophils Relative % 90 %   Neutro Abs 14.9 (H) 1.7 - 7.7 K/uL   Lymphocytes Relative 7 %   Lymphs Abs 1.2 0.7 - 4.0 K/uL   Monocytes Relative 2 %   Monocytes Absolute 0.3 0.1 - 1.0 K/uL   Eosinophils Relative 0 %   Eosinophils Absolute 0.0 0.0 - 0.7 K/uL   Basophils Relative 0 %   Basophils Absolute 0.0 0.0 - 0.1 K/uL   Immature Granulocytes 1 %   Abs Immature Granulocytes 0.2 (H) 0.0 - 0.1 K/uL    Comment: Performed at [facility] [address], [place], [place] [address]  Blood Culture (routine x 2)     Status: None (Preliminary result)   Collection Time: 09/04/17  5:45 PM  Result  Value Ref Range   Specimen Description BLOOD RIGHT ANTECUBITAL    Special Requests      BOTTLES DRAWN AEROBIC AND ANAEROBIC Blood Culture adequate volume   Culture  NO GROWTH < 24 HOURS Performed at [facility] [address] [place], [place] [address]    Report Status PENDING   Sedimentation rate     Status: Abnormal   Collection Time: 09/04/17  5:45 PM  Result Value Ref Range   Sed Rate >140 (H) 0 - 16 mm/hr    Comment: Performed at [facility] [address], [place], [place] [address]  Type and screen [place]     Status: None (Preliminary result)   Collection Time: 09/04/17  5:45 PM  Result Value Ref Range   ABO/RH(D) B POS    Antibody Screen NEG    Sample Expiration 09/07/2017    Unit Number [id number]    Blood Component Type RED CELLS,LR    Unit division 00    Status of Unit ISSUED,FINAL    Unit tag comment VERBAL ORDERS PER DR NANAVATI    Transfusion Status OK TO TRANSFUSE    Crossmatch Result COMPATIBLE    Unit Number [id number]    Blood Component Type RBC LR PHER1    Unit division 00    Status of Unit ISSUED,FINAL    Unit tag comment VERBAL ORDERS PER DR NANAVATI    Transfusion Status OK TO TRANSFUSE    Crossmatch Result COMPATIBLE    Unit Number [id number]    Blood Component Type RED CELLS,LR    Unit division 00    Status of Unit ALLOCATED    Transfusion Status OK TO TRANSFUSE    Crossmatch Result Compatible    Unit Number [id number]    Blood Component Type RED CELLS,LR    Unit division 00    Status of Unit ALLOCATED    Transfusion Status OK TO TRANSFUSE    Crossmatch Result Compatible   I-Stat CG4 Lactic Acid, ED  (not at  [facility])     Status: Abnormal   Collection Time: 09/04/17  6:00 PM  Result Value Ref Range   Lactic Acid, Venous 4.44 (HH) 0.5 - 1.9 mmol/L   Comment NOTIFIED PHYSICIAN   Blood Culture (routine x 2)     Status: None (Preliminary result)   Collection Time: 09/04/17  6:04 PM  Result  Value Ref Range   Specimen Description BLOOD LEFT FOREARM    Special Requests      BOTTLES DRAWN AEROBIC AND ANAEROBIC Blood Culture results may not be optimal due to an inadequate volume of blood received in culture bottles   Culture      NO GROWTH < 24 HOURS Performed at [facility] [address], [place], [place] [address]    Report Status PENDING   I-Stat Venous Blood Gas, ED (order at [facility] and [facility] only)     Status: Abnormal   Collection Time: 09/04/17  6:10 PM  Result Value Ref Range   pH, Ven 7.334 7.250 - 7.430   pCO2, Ven 55.3 44.0 - 60.0 mmHg   pO2, Ven 23.0 (LL) 32.0 - 45.0 mmHg   Bicarbonate 29.5 (H) 20.0 - 28.0 mmol/L   TCO2 31 22 - 32 mmol/L   O2 Saturation 34.0 %   Acid-Base Excess 3.0 (H) 0.0 - 2.0 mmol/L   Patient temperature HIDE    Sample type VENOUS    Comment NOTIFIED PHYSICIAN   Prepare RBC     Status: None   Collection Time: 09/04/17  6:24 PM  Result Value Ref Range   Order Confirmation      ORDER PROCESSED BY BLOOD  BANK Performed at [facility], [place] [address] [place], [place] [address]   POC occult blood, ED Provider will collect     Status: None   Collection Time: 09/04/17  7:31 PM  Result Value Ref Range   Fecal Occult Bld NEGATIVE NEGATIVE  Vitamin B12     Status: None   Collection Time: 09/04/17 11:03 PM  Result Value Ref Range   Vitamin B-12 334 180 - 914 pg/mL    Comment: (NOTE) This assay is not validated for testing neonatal or myeloproliferative syndrome specimens for Vitamin B12 levels. Performed at [facility], [place] [address], [place], [place] [address]   Ferritin     Status: Abnormal   Collection Time: 09/04/17 11:03 PM  Result Value Ref Range   Ferritin 441 (H) 24 - 336 ng/mL    Comment: Performed at [facility], [place] [address] [place], [place] [address]  Iron and TIBC     Status: Abnormal   Collection Time: 09/04/17 11:03 PM  Result Value Ref Range   Iron 38 (L) 45 - 182 ug/dL   TIBC 126 (L) 250 -  450 ug/dL   Saturation Ratios 30 17.9 - 39.5 %   UIBC 88 ug/dL    Comment: Performed at [facility], [place] [address] [place], [place] [address]  Magnesium     Status: Abnormal   Collection Time: 09/04/17 11:03 PM  Result Value Ref Range   Magnesium 1.6 (L) 1.7 - 2.4 mg/dL    Comment: Performed at [facility] [address] [place], [place] [address]  Phosphorus     Status: Abnormal   Collection Time: 09/04/17 11:03 PM  Result Value Ref Range   Phosphorus 2.0 (L) 2.5 - 4.6 mg/dL    Comment: Performed at [facility] [address], [place], [place] [address]  Procalcitonin - Baseline     Status: None   Collection Time: 09/04/17 11:58 PM  Result Value Ref Range   Procalcitonin 1.89 ng/mL    Comment:        Interpretation: PCT > 0.5 ng/mL and <= 2 ng/mL: Systemic infection (sepsis) is possible, but other conditions are known to elevate PCT as well. (NOTE)       Sepsis PCT Algorithm           Lower Respiratory Tract                                      Infection PCT Algorithm    ----------------------------     ----------------------------         PCT < 0.25 ng/mL                PCT < 0.10 ng/mL         Strongly encourage             Strongly discourage   discontinuation of antibiotics    initiation of antibiotics    ----------------------------     -----------------------------       PCT 0.25 - 0.50 ng/mL            PCT 0.10 - 0.25 ng/mL               OR       >80% decrease in PCT            Discourage initiation of  antibiotics      Encourage discontinuation           of antibiotics    ----------------------------     -----------------------------         PCT >= 0.50 ng/mL              PCT 0.26 - 0.50 ng/mL                AND       <80% decrease in PCT             Encourage initiation of                                             antibiotics       Encourage continuation           of antibiotics     ----------------------------     -----------------------------        PCT >= 0.50 ng/mL                  PCT > 0.50 ng/mL               AND         increase in PCT                  Strongly encourage                                      initiation of antibiotics    Strongly encourage escalation           of antibiotics                                     -----------------------------                                           PCT <= 0.25 ng/mL                                                 OR                                        > 80% decrease in PCT                                     Discontinue / Do not initiate                                             antibiotics Performed at [facility], [place] [address], [place], [place] [address]   Lactic acid, plasma     Status: None   Collection Time:  09/04/17 11:58 PM  Result Value Ref Range   Lactic Acid, Venous 1.2 0.5 - 1.9 mmol/L    Comment: Performed at [facility] [address], [place], [place] [address]  Cortisol     Status: None   Collection Time: 09/04/17 11:58 PM  Result Value Ref Range   Cortisol, Plasma 12.7 ug/dL    Comment: (NOTE) AM    6.7 - 22.6 ug/dL PM   <10.0       ug/dL Performed at [facility] [address] [place], [place] [address]   Reticulocytes     Status: Abnormal   Collection Time: 09/04/17 11:58 PM  Result Value Ref Range   Retic Ct Pct 1.6 0.4 - 3.1 %   RBC. 3.01 (L) 4.22 - 5.81 MIL/uL   Retic Count, Absolute 48.2 19.0 - 186.0 K/uL    Comment: Performed at [facility] [address], [place], [place] [address]  Lactate dehydrogenase     Status: None   Collection Time: 09/04/17 11:58 PM  Result Value Ref Range   LDH 133 98 - 192 U/L    Comment: Performed at [facility], [place] [address] [place], [place] [address]  Save smear     Status: None   Collection Time: 09/04/17 11:58 PM  Result Value Ref Range   Smear Review SMEAR STAINED AND AVAILABLE FOR REVIEW      Comment: Performed at [facility], [place] [address], [place], [place] [address]  Fibrinogen (coagulopathy lab panel)     Status: Abnormal   Collection Time: 09/04/17 11:58 PM  Result Value Ref Range   Fibrinogen >800 (H) 210 - 475 mg/dL    Comment: Performed at [facility] [address] [place], [place] [address]  Protime-INR (coagulopathy lab panel)     Status: Abnormal   Collection Time: 09/04/17 11:58 PM  Result Value Ref Range   Prothrombin Time 16.2 (H) 11.4 - 15.2 seconds   INR 1.32     Comment: Performed at [facility] [address] [place], [place] [address]  APTT (coagulopathy lab panel)     Status: None   Collection Time: 09/04/17 11:58 PM  Result Value Ref Range   aPTT 31 24 - 36 seconds    Comment: Performed at [facility] [address], [place], [place] [address]  CBC     Status: Abnormal   Collection Time: 09/04/17 11:58 PM  Result Value Ref Range   WBC 18.1 (H) 4.0 - 10.5 K/uL   RBC 2.97 (L) 4.22 - 5.81 MIL/uL   Hemoglobin 8.5 (L) 13.0 - 17.0 g/dL    Comment: REPEATED TO VERIFY DELTA CHECK NOTED POST TRANSFUSION SPECIMEN    HCT 24.7 (L) 39.0 - 52.0 %   MCV 83.2 78.0 - 100.0 fL   MCH 28.6 26.0 - 34.0 pg   MCHC 34.4 30.0 - 36.0 g/dL   RDW 12.9 11.5 - 15.5 %   Platelets 286 150 - 400 K/uL    Comment: Performed at [facility], [place] [address] [place],  [address]  CBG monitoring, ED     Status: None   Collection Time: 09/05/17 12:07 AM  Result Value Ref Range   Glucose-Capillary 90 65 - 99 mg/dL  CBG monitoring, ED     Status: Abnormal   Collection Time: 09/05/17  3:59 AM  Result Value Ref Range   Glucose-Capillary 105 (H) 65 -  99 mg/dL  Urinalysis, Routine w reflex microscopic     Status: Abnormal   Collection Time: 09/05/17  4:04 AM  Result Value Ref Range   Color, Urine YELLOW YELLOW   APPearance HAZY (A) CLEAR   Specific Gravity, Urine 1.015 1.005 - 1.030   pH 5.0 5.0 - 8.0   Glucose, UA >=500 (A)  NEGATIVE mg/dL   Hgb urine dipstick MODERATE (A) NEGATIVE   Bilirubin Urine NEGATIVE NEGATIVE   Ketones, ur 5 (A) NEGATIVE mg/dL   Protein, ur 100 (A) NEGATIVE mg/dL   Nitrite NEGATIVE NEGATIVE   Leukocytes, UA NEGATIVE NEGATIVE   RBC / HPF 0-5 0 - 5 RBC/hpf   WBC, UA 0-5 0 - 5 WBC/hpf   Bacteria, UA NONE SEEN NONE SEEN   Squamous Epithelial / LPF 0-5 0 - 5    Comment: Performed at [facility], [place] [address] [place],  [address]  Procalcitonin     Status: None   Collection Time: 09/05/17  6:25 AM  Result Value Ref Range   Procalcitonin 34.50 ng/mL    Comment:        Interpretation: PCT >= 10 ng/mL: Important systemic inflammatory response, almost exclusively due to severe bacterial sepsis or septic shock. (NOTE)       Sepsis PCT Algorithm           Lower Respiratory Tract                                      Infection PCT Algorithm    ----------------------------     ----------------------------         PCT < 0.25 ng/mL                PCT < 0.10 ng/mL         Strongly encourage             Strongly discourage   discontinuation of antibiotics    initiation of antibiotics    ----------------------------     -----------------------------       PCT 0.25 - 0.50 ng/mL            PCT 0.10 - 0.25 ng/mL               OR       >80% decrease in PCT            Discourage initiation of                                            antibiotics      Encourage discontinuation           of antibiotics    ----------------------------     -----------------------------         PCT >= 0.50 ng/mL              PCT 0.26 - 0.50 ng/mL                AND       <80% decrease in PCT             Encourage initiation of  antibiotics       Encourage continuation           of antibiotics    ----------------------------     -----------------------------        PCT >= 0.50 ng/mL                  PCT > 0.50 ng/mL               AND         increase in  PCT                  Strongly encourage                                      initiation of antibiotics    Strongly encourage escalation           of antibiotics                                     -----------------------------                                           PCT <= 0.25 ng/mL                                                 OR                                        > 80% decrease in PCT                                     Discontinue / Do not initiate                                             antibiotics Performed at [facility], [address], [place], [place] [address]   Lactic acid, plasma     Status: None   Collection Time: 09/05/17  6:25 AM  Result Value Ref Range   Lactic Acid, Venous 1.1 0.5 - 1.9 mmol/L    Comment: Performed at [facility] [address] [place], [place] [address]  CBC     Status: Abnormal   Collection Time: 09/05/17  6:25 AM  Result Value Ref Range   WBC 19.6 (H) 4.0 - 10.5 K/uL   RBC 3.40 (L) 4.22 - 5.81 MIL/uL   Hemoglobin 9.6 (L) 13.0 - 17.0 g/dL   HCT 28.1 (L) 39.0 - 52.0 %   MCV 82.6 78.0 - 100.0 fL   MCH 28.2 26.0 - 34.0 pg   MCHC 34.2 30.0 - 36.0 g/dL   RDW 13.1 11.5 - 15.5 %   Platelets 263 150 - 400 K/uL    Comment: Performed at [facility], [address] [place], [place] [address]  Comprehensive metabolic panel     Status: Abnormal   Collection Time: 09/05/17  6:25 AM  Result Value Ref Range   Sodium 129 (L) 135 - 145 mmol/L   Potassium 3.5 3.5 - 5.1 mmol/L   Chloride 91 (L) 101 - 111 mmol/L   CO2 28 22 - 32 mmol/L   Glucose, Bld 113 (H) 65 - 99 mg/dL   BUN 22 (H) 6 - 20 mg/dL   Creatinine, Ser 1.68 (H) 0.61 - 1.24 mg/dL   Calcium 8.4 (L) 8.9 - 10.3 mg/dL   Total Protein 5.8 (L) 6.5 - 8.1 g/dL   Albumin 1.5 (L) 3.5 - 5.0 g/dL   AST 23 15 - 41 U/L   ALT 13 (L) 17 - 63 U/L   Alkaline Phosphatase 107 38 - 126 U/L   Total Bilirubin 1.8 (H) 0.3 - 1.2 mg/dL   GFR calc non Af Amer 45 (L) >60 mL/min    GFR calc Af Amer 52 (L) >60 mL/min    Comment: (NOTE) The eGFR has been calculated using the CKD EPI equation. This calculation has not been validated in all clinical situations. eGFR's persistently <60 mL/min signify possible Chronic Kidney Disease.    Anion gap 10 5 - 15    Comment: Performed at [facility] [address], [place], [place] [address]  CBG monitoring, ED     Status: Abnormal   Collection Time: 09/05/17  7:42 AM  Result Value Ref Range   Glucose-Capillary 120 (H) 65 - 99 mg/dL  CBG monitoring, ED     Status: Abnormal   Collection Time: 09/05/17 12:09 PM  Result Value Ref Range   Glucose-Capillary 129 (H) 65 - 99 mg/dL    Dg Sacrum/coccyx  Result Date: 09/04/2017 CLINICAL DATA:  Fall with sacral pain EXAM: SACRUM AND COCCYX - 2+ VIEW COMPARISON:  None. FINDINGS: There is no evidence of fracture or other focal bone lesions. IMPRESSION: Negative. Electronically Signed   By: Donavan Foil M.D.   On: 09/04/2017 19:59   Mr Lumbar Spine Wo Contrast  Result Date: 09/05/2017 CLINICAL DATA:  Recent fall. Weakness. Diabetic. DKA. Foot osteomyelitis. EXAM: MRI LUMBAR SPINE WITHOUT CONTRAST TECHNIQUE: Multiplanar, multisequence MR imaging of the lumbar spine was performed. No intravenous contrast was administered. COMPARISON:  None. FINDINGS: Segmentation:  Standard. Alignment:  Anatomic. Vertebrae:  No worrisome osseous lesion. Conus medullaris and cauda equina: Conus extends to the L1. level. Conus and cauda equina appear normal. Paraspinal and other soft tissues: Marked bladder distention, query bladder outlet obstruction or neurogenic dysfunction. Disc levels: L1-L2:  Normal. L2-L3:  Normal. L3-L4:  Normal. L4-L5: Central and leftward protrusion. Facet arthropathy. Mild stenosis. Definite impingement not established. L5-S1: Annular bulge. Facet arthropathy. No impingement. In the ventral epidural space opposite S1, there is a T1 and T2 hypointense structure, probable venous  plexus although a small caudally extruded disc fragment not completely excluded. No signs of epidural hematoma. No significant cauda equina compression. IMPRESSION: No lumbar compression fracture or traumatic subluxation. Central and leftward protrusion at L4-5 with mild stenosis, but no definite impingement. Annular bulge L5-S1. Small ventral epidural T1 and T2 hypointense structure behind S1, could represent caudally extruded disc fragment, but no significant cauda equina compression. Marked bladder distention, query bladder outlet obstruction or neurogenic dysfunction. Electronically Signed   By: Staci Righter M.D.   On: 09/05/2017 11:37   Mr Foot Left Wo Contrast  Result Date: 09/05/2017 CLINICAL DATA:  Chronic open wound along the left heel. Diffuse swelling in the foot extending into the toes. Diabetes. EXAM: MRI OF THE LEFT HINDFOOT WITHOUT CONTRAST TECHNIQUE: Multiplanar, multisequence MR imaging of the ankle was performed. No intravenous contrast was administered. COMPARISON:  09/04/2017 FINDINGS: TENDONS Peroneal: Mild tendinopathy of the peroneus brevis. Posteromedial: Unremarkable Anterior: Unremarkable Achilles: Moderate distal Achilles tendinopathy with expansion and increased signal in the tendon. Plantar Fascia: Prominent thickening of the medial and lateral band of the plantar fascia with poor visualization and attenuation of the connection to the plantar calcaneus. This does not represent a total rupture given the apparent continuity on image 17/6, but may represent partial tearing. There is a soft tissue deficiency in this region overlying the plantar calcaneus. LIGAMENTS Lateral: Unremarkable although there appear to be fragmented osteophytes/ossicles deep to the anterior talofibular ligament on images 16-21 of series 4. Medial: Unremarkable CARTILAGE Ankle Joint: Unremarkable Subtalar Joints/Sinus Tarsi: Unremarkable Bones: Abnormal edema compatible with active osteomyelitis involving the  posterior calcaneus extending as far forward as the calcaneal neck. Other: Prominent loss of soft tissue thickness along the posterior and plantar portion of the posterior calcaneus compatible with notable ulceration down nearly to the periosteum. Diffuse subcutaneous edema in the ankle tracking into the foot especially dorsally. Speckled low densities in the soft tissues superficial to the plantar fascia and extending distally in the foot, compatible with gas tracking in the soft tissues. This extends at least to the distal metatarsal level. IMPRESSION: 1. 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 There is abnormal gas tracking along the plantar fascia and into the forefoot, extending at least as far as the level of the distal metatarsals, probably reflecting fasciitis. I do not see a drainable soft tissue abscess. 2. The osteomyelitis as undermined the plantar fascia connection to the calcaneus, which is thought to be significantly partially detached although not totally ruptured. 3. Moderate Achilles tendinopathy distally. 4. Abnormal edema in the ankle and foot suggesting cellulitis. 5. Mild peroneus brevis tendinopathy. Electronically Signed   By: Van Clines M.D.   On: 09/05/2017 08:08   Dg Chest Port 1 View  Result Date: 09/04/2017 CLINICAL DATA:  Sepsis EXAM: PORTABLE CHEST 1 VIEW COMPARISON:  09/04/2015 CXR FINDINGS: The heart size and mediastinal contours are within normal limits. Minimal aortic atherosclerosis is noted. Both lungs are clear. ACDF hardware across the C5-6 interspace. The visualized skeletal structures are unremarkable. IMPRESSION: No active disease. Electronically Signed   By: Ashley Royalty M.D.   On: 09/04/2017 18:30   Dg Foot 2 Views Left  Result Date: 09/04/2017 CLINICAL DATA:  Sepsis EXAM: LEFT FOOT - 2 VIEW COMPARISON:  08/23/2017 radiographs and CT FINDINGS: Soft tissue wound along the plantar aspect  of the heel. Plantar calcaneal cortical irregularity is stable subjacent to skin ulceration. There is persistent soft tissue swelling along the plantar aspect of the mid and hindfoot though less so since prior. Interval increase in soft tissue swelling is noted of the forefoot however since prior. Lucencies along the plantar aspect of the foot may represent soft tissue emphysema. Remote calcaneal fracture with healing redemonstrated. Degenerative joint space narrowing of DIP and PIP joints of the second through fifth digits, first through fifth MTP articulations and midfoot. IMPRESSION: 1. Redemonstration of large soft tissue defect along the plantar aspect of the calcaneus with cortical irregularity and osteopenia subjacent to the skin wound compatible with chronic changes of osteomyelitis possibly acute on chronic. No significant bony  changes are identified. 2. Soft tissue swelling along the plantar aspect of the mid and hindfoot persists with subcutaneous soft tissue emphysema suggested with decreased swelling along the plantar and dorsal aspect of the foot but increased along the dorsum and plantar aspect of the forefoot. Electronically Signed   By: Ashley Royalty M.D.   On: 09/04/2017 18:37    Review of Systems  Unable to perform ROS: Acuity of condition   Blood pressure 118/82, pulse (!) 118, temperature (!) 102.2 F (39 C), temperature source Oral, resp. rate 15, height '5\' 11"'  (1.803 m), weight 84.8 kg (187 lb), SpO2 100 %. Physical Exam  Constitutional: He is oriented to person, place, and time. He appears well-developed and well-nourished.  HENT:  Head: Atraumatic.  Eyes: EOM are normal.  Respiratory: Effort normal.  Musculoskeletal:  L foot with large open wound inferiorly.  The entire foot is warm ertyhematous and there is crepitance inferiorly.  Above the ankle the skin is intact and healthy appearing with no erythema or crepitance.  Neurological: He is alert and oriented to person, place,  and time.  Psychiatric: He has a normal mood and affect.    Assessment/Plan: Sepsis with diabetic severe left foot wound with concern of developing fasciitis I spoke with the family at length and the patient who are eager to proceed with below-knee amputation to get the infection under control.  The family and the patient understand and agree that this infection cannot be controlled with debridement and antibiotics and requires amputation to prevent patient from becoming more severely ill. We talked about the procedure as well as expected postoperative course and all agree that this is the best course for the patient at this time.  We will get him up to the operating room urgently and he will be admitted to the hospitalist service postoperatively likely in a stepdown unit.  Isabella Stalling 09/05/2017, 1:33 PM

## 2017-09-05 NOTE — Anesthesia Procedure Notes (Signed)
Procedure Name: LMA Insertion Date/Time: 09/05/2017 2:39 PM Performed by: Clearnce Sorrel, CRNA Pre-anesthesia Checklist: Patient identified, Emergency Drugs available, Suction available, Patient being monitored and Timeout performed Patient Re-evaluated:Patient Re-evaluated prior to induction Oxygen Delivery Method: Circle system utilized Preoxygenation: Pre-oxygenation with 100% oxygen Induction Type: IV induction LMA: LMA inserted LMA Size: 4.0 Number of attempts: 1 Placement Confirmation: positive ETCO2 and breath sounds checked- equal and bilateral Tube secured with: Tape Dental Injury: Teeth and Oropharynx as per pre-operative assessment

## 2017-09-05 NOTE — ED Notes (Signed)
Patient transported to MRI 

## 2017-09-05 NOTE — ED Notes (Signed)
Collected another gold top and sent to main lab for remaining tests.

## 2017-09-05 NOTE — ED Notes (Signed)
Attending at bedside.

## 2017-09-05 NOTE — ED Notes (Signed)
MD notified of patient BP.-see chart

## 2017-09-05 NOTE — Op Note (Signed)
Procedure(s): AMPUTATION BELOW KNEE Procedure Note  Chase Clark male 52 y.o. 09/05/2017  Procedure(s) and Anesthesia Type:    * LEFT AMPUTATION BELOW KNEE - Choice  Surgeon(s) and Role:    Tania Ade, MD - Primary   Indications:  52 y.o. male with diabetes, severe left foot infection, refractory to antibiotics and wound management.  He got progressively more sick and became septic today with blood pressure is difficult to control in the emergency department and high fevers.  He was indicated for urgent below-knee amputation.  I spoke with the patient and his family about it and all wished to proceed with surgery.     Surgeon: Isabella Stalling   Assistants: Jeanmarie Hubert PA-C [facility] was present and scrubbed throughout the procedure and was essential in positioning, retraction, exposure, and closure)  Anesthesia: General endotracheal anesthesia    Procedure Detail  AMPUTATION BELOW KNEE  Findings: The tissues proximally at the level of the amputation were healthy and the flap closed without any tension.  Estimated Blood Loss:  less than 50 mL         Drains: none  Blood Given: none         Specimens: none        Complications:  * No complications entered in OR log *         Disposition: PACU - hemodynamically stable.         Condition: stable    Procedure:  The patient was identified in the preoperative  holding area where I personally marked the operative site after  verifying site side and procedure with the patient. The patient was taken back  to the operating room where general anesthesia was induced without  Complication.  He was kept in the supine position.  The left lower extremity was prepped and draped in standard sterile fashion.  A nonsterile tourniquet was applied prior to prepping and draping.  The limb was elevated and the tourniquet was elevated to 350 mmHg.  Sharp dissection was carried down through the skin and subcutaneous  tissue and fascia in the planned anterior and posterior flaps.  The anterior compartment was sharply transected and the vessels were double ligated and nerves were pulled transected and allowed to retract.  The tibia and fibula were cut at the appropriate level.  A amputation knife was used to come through the posterior tissues.  The remaining nerves and blood vessels were ligated and cut.  The anterior tibia was beveled with soft.  The tourniquet was let down after about 22 minutes.  Any remaining bleeders were coagulated.  A medium Hemovac drain was placed out proximal anteromedial.  The posterior flap was then brought up over the tibia and the Achilles tendon was repaired to the anterior periosteum.  The fascia was then closed with 0 Vicryl in interrupted fashion.  The subcutaneous layer was then closed with 2-0 Vicryl and skin was closed with 2-0 nylon in a horizontal mattress configuration.  The wound closed nicely with no tension.  Sterile dressing was then applied with Adaptic, 4 x 4's, ABDs, Kerlix and an Ace bandage.  POSTOPERATIVE PLAN: The patient will be readmitted to the medicine service for medical management.  We will follow along.

## 2017-09-05 NOTE — Transfer of Care (Signed)
Immediate Anesthesia Transfer of Care Note  Patient: Chase Clark  Procedure(s) Performed: AMPUTATION BELOW KNEE (Left Leg Lower)  Patient Location: PACU  Anesthesia Type:General  Level of Consciousness: awake, alert  and oriented  Airway & Oxygen Therapy: Patient Spontanous Breathing and Patient connected to nasal cannula oxygen  Post-op Assessment: Report given to RN and Post -op Vital signs reviewed and stable  Post vital signs: Reviewed and stable  Last Vitals:  Vitals Value Taken Time  BP 103/63 09/05/2017  4:08 PM  Temp    Pulse 86 09/05/2017  4:10 PM  Resp 18 09/05/2017  4:09 PM  SpO2 100 % 09/05/2017  4:10 PM  Vitals shown include unvalidated device data.  Last Pain:  Vitals:   09/05/17 1223  TempSrc: Oral  PainSc:          Complications: No apparent anesthesia complications

## 2017-09-05 NOTE — Anesthesia Preprocedure Evaluation (Signed)
Anesthesia Evaluation  Patient identified by MRN, date of birth, ID band Patient awake    Reviewed: Allergy & Precautions, NPO status , Patient's Chart, lab work & pertinent test results  Airway Mallampati: II  TM Distance: >3 FB Neck ROM: Full    Dental no notable dental hx.    Pulmonary neg pulmonary ROS, former smoker,    Pulmonary exam normal breath sounds clear to auscultation       Cardiovascular hypertension, + CAD, + Cardiac Stents and + Peripheral Vascular Disease  Normal cardiovascular exam Rhythm:Regular Rate:Normal     Neuro/Psych negative neurological ROS  negative psych ROS   GI/Hepatic negative GI ROS, Neg liver ROS,   Endo/Other  diabetes  Renal/GU Renal InsufficiencyRenal disease  negative genitourinary   Musculoskeletal negative musculoskeletal ROS (+)   Abdominal   Peds negative pediatric ROS (+)  Hematology  (+) anemia ,   Anesthesia Other Findings   Reproductive/Obstetrics negative OB ROS                             Anesthesia Physical Anesthesia Plan  ASA: IV  Anesthesia Plan: General   Post-op Pain Management:    Induction: Intravenous  PONV Risk Score and Plan: 2 and Ondansetron and Treatment may vary due to age or medical condition  Airway Management Planned: LMA and Oral ETT  Additional Equipment:   Intra-op Plan:   Post-operative Plan: Extubation in OR  Informed Consent: I have reviewed the patients History and Physical, chart, labs and discussed the procedure including the risks, benefits and alternatives for the proposed anesthesia with the patient or authorized representative who has indicated his/her understanding and acceptance.   Dental advisory given  Plan Discussed with: CRNA and Surgeon  Anesthesia Plan Comments:         Anesthesia Quick Evaluation

## 2017-09-05 NOTE — Anesthesia Postprocedure Evaluation (Signed)
Anesthesia Post Note  Patient: Chase Clark  Procedure(s) Performed: AMPUTATION BELOW KNEE (Left Leg Lower)     Patient location during evaluation: PACU Anesthesia Type: General Level of consciousness: awake and alert Pain management: pain level controlled Vital Signs Assessment: post-procedure vital signs reviewed and stable Respiratory status: spontaneous breathing, nonlabored ventilation, respiratory function stable and patient connected to nasal cannula oxygen Cardiovascular status: blood pressure returned to baseline and stable Postop Assessment: no apparent nausea or vomiting Anesthetic complications: no    Last Vitals:  Vitals:   09/05/17 1607 09/05/17 1615  BP: 103/63   Pulse: 87 93  Resp: 20 14  Temp: (!) 36.4 C   SpO2: 100% 99%    Last Pain:  Vitals:   09/05/17 1607  TempSrc:   PainSc: 0-No pain                 ROSE,GEORGE S

## 2017-09-05 NOTE — ED Notes (Signed)
MD ordered  500ML Bolus NS verbal order. Fluid administered will continue to re-assess

## 2017-09-05 NOTE — ED Notes (Signed)
Patient response well to bolus fluid- BP has increased from 88/42(manual) to 123/76

## 2017-09-06 ENCOUNTER — Encounter (HOSPITAL_COMMUNITY): Payer: Self-pay | Admitting: Orthopedic Surgery

## 2017-09-06 DIAGNOSIS — D649 Anemia, unspecified: Secondary | ICD-10-CM

## 2017-09-06 DIAGNOSIS — R6521 Severe sepsis with septic shock: Secondary | ICD-10-CM

## 2017-09-06 LAB — GLUCOSE, CAPILLARY
Glucose-Capillary: 154 mg/dL — ABNORMAL HIGH (ref 65–99)
Glucose-Capillary: 159 mg/dL — ABNORMAL HIGH (ref 65–99)
Glucose-Capillary: 175 mg/dL — ABNORMAL HIGH (ref 65–99)
Glucose-Capillary: 189 mg/dL — ABNORMAL HIGH (ref 65–99)
Glucose-Capillary: 209 mg/dL — ABNORMAL HIGH (ref 65–99)
Glucose-Capillary: 236 mg/dL — ABNORMAL HIGH (ref 65–99)

## 2017-09-06 LAB — CBC
HCT: 25.7 % — ABNORMAL LOW (ref 39.0–52.0)
Hemoglobin: 8.5 g/dL — ABNORMAL LOW (ref 13.0–17.0)
MCH: 28.1 pg (ref 26.0–34.0)
MCHC: 33.1 g/dL (ref 30.0–36.0)
MCV: 84.8 fL (ref 78.0–100.0)
Platelets: 232 10*3/uL (ref 150–400)
RBC: 3.03 MIL/uL — ABNORMAL LOW (ref 4.22–5.81)
RDW: 13.6 % (ref 11.5–15.5)
WBC: 10.7 10*3/uL — ABNORMAL HIGH (ref 4.0–10.5)

## 2017-09-06 LAB — PROCALCITONIN: Procalcitonin: 44.34 ng/mL

## 2017-09-06 LAB — BASIC METABOLIC PANEL
Anion gap: 8 (ref 5–15)
BUN: 24 mg/dL — ABNORMAL HIGH (ref 6–20)
CO2: 24 mmol/L (ref 22–32)
Calcium: 7.2 mg/dL — ABNORMAL LOW (ref 8.9–10.3)
Chloride: 98 mmol/L — ABNORMAL LOW (ref 101–111)
Creatinine, Ser: 1.97 mg/dL — ABNORMAL HIGH (ref 0.61–1.24)
GFR calc Af Amer: 43 mL/min — ABNORMAL LOW (ref >60)
GFR calc non Af Amer: 37 mL/min — ABNORMAL LOW (ref >60)
Glucose, Bld: 210 mg/dL — ABNORMAL HIGH (ref 65–99)
Potassium: 3 mmol/L — ABNORMAL LOW (ref 3.5–5.1)
Sodium: 130 mmol/L — ABNORMAL LOW (ref 135–145)

## 2017-09-06 LAB — HAPTOGLOBIN: Haptoglobin: 491 mg/dL — ABNORMAL HIGH (ref 34–200)

## 2017-09-06 LAB — URINE CULTURE

## 2017-09-06 MED ORDER — OXYCODONE HCL 5 MG PO TABS
10.0000 mg | ORAL_TABLET | ORAL | Status: DC | PRN
  Administered 2017-09-07: 10 mg via ORAL
  Filled 2017-09-06: qty 2

## 2017-09-06 MED ORDER — TAMSULOSIN HCL 0.4 MG PO CAPS
0.4000 mg | ORAL_CAPSULE | Freq: Every day | ORAL | Status: DC
  Administered 2017-09-06 – 2017-09-09 (×4): 0.4 mg via ORAL
  Filled 2017-09-06 (×4): qty 1

## 2017-09-06 MED ORDER — OXYCODONE HCL 5 MG PO TABS: 5.0000 mg | ORAL_TABLET | ORAL | 0 refills | Status: DC | PRN

## 2017-09-06 MED ORDER — LIDOCAINE 5 % EX PTCH
1.0000 | MEDICATED_PATCH | CUTANEOUS | Status: DC
  Administered 2017-09-06 – 2017-09-08 (×3): 1 via TRANSDERMAL
  Filled 2017-09-06 (×3): qty 1

## 2017-09-06 MED ORDER — MAGNESIUM OXIDE 400 (241.3 MG) MG PO TABS: 800.0000 mg | ORAL_TABLET | Freq: Two times a day (BID) | ORAL | Status: DC

## 2017-09-06 MED ORDER — POTASSIUM CHLORIDE CRYS ER 20 MEQ PO TBCR
40.0000 meq | EXTENDED_RELEASE_TABLET | Freq: Four times a day (QID) | ORAL | Status: AC
  Administered 2017-09-06 (×2): 40 meq via ORAL
  Filled 2017-09-06 (×2): qty 2

## 2017-09-06 MED ORDER — ACETAMINOPHEN 325 MG PO TABS: 325.0000 mg | ORAL_TABLET | Freq: Four times a day (QID) | ORAL | 0 refills | Status: DC | PRN

## 2017-09-06 MED ORDER — MAGNESIUM SULFATE IN D5W 1-5 GM/100ML-% IV SOLN
1.0000 g | Freq: Once | INTRAVENOUS | Status: AC
  Administered 2017-09-06: 1 g via INTRAVENOUS
  Filled 2017-09-06: qty 100

## 2017-09-06 MED ORDER — SODIUM CHLORIDE 0.9 % IV SOLN
INTRAVENOUS | Status: AC
  Administered 2017-09-06: 13:00:00 via INTRAVENOUS

## 2017-09-06 MED ORDER — PREDNISOLONE ACETATE 1 % OP SUSP
1.0000 [drp] | Freq: Every day | OPHTHALMIC | Status: DC
  Filled 2017-09-06: qty 5

## 2017-09-06 NOTE — Progress Notes (Signed)
   PATIENT ID: Chase Clark   1 Day Post-Op Procedure(s) (LRB): AMPUTATION BELOW KNEE (Left)  Subjective: Reports doing great after L BKA. Min pain in left leg. No complaints or concerns. Eager to work w PT.   Objective:  Vitals:   09/06/17 0130 09/06/17 0441  BP: (!) 94/56   Pulse: 70   Resp: 14   Temp:  97.7 F (36.5 C)  SpO2: 98%      L UE dressing c/d/i Drain in place with minimal drainage   Labs:  Recent Labs    09/04/17 1745 09/04/17 2358 09/05/17 0625 09/06/17 0327  HGB 6.1* 8.5* 9.6* 8.5*   Recent Labs    09/05/17 0625 09/06/17 0327  WBC 19.6* 10.7*  RBC 3.40* 3.03*  HCT 28.1* 25.7*  PLT 263 232   Recent Labs    09/05/17 0625 09/06/17 0327  NA 129* 130*  K 3.5 3.0*  CL 91* 98*  CO2 28 24  BUN 22* 24*  CREATININE 1.68* 1.97*  GLUCOSE 113* 210*  CALCIUM 8.4* 7.2*    Assessment and Plan: 1 day s/p L BKA Pain well conctrolled Up with PT when stable [employer] consulted for prothesis/ patient has a cousin with a prosthesis company as well he may reach out to.  Anemia- acute on chronic, defer to medicine for tx. Min blood loss with sx abx per medicine, we appreciate their help in care for this nice patient   VTE proph: per primary team

## 2017-09-06 NOTE — Progress Notes (Signed)
Rehab Admissions Coordinator Note:  Patient was screened by Cleatrice Burke for appropriateness for an Inpatient Acute Rehab Consult per PT recommendation. At this time, we are recommending Inpatient Rehab consult. Please place an order for a Rehab consult if pt and family would like to be considered for a CIR admit. Please advise.  Cleatrice Burke 09/06/2017, 1:55 PM  I can be reached at [phone].

## 2017-09-06 NOTE — Progress Notes (Signed)
Pharmacy Antibiotic Note  Chase Clark is a 52 y.o. male admitted on 09/04/2017 with sepsis.  Pharmacy has been consulted for Vancomycin/Zosyn dosing. Per MD continue vancomycin for additional 24 hours, and will most likely stop antibiotics in the next day or so if patient doing better clinically.  Plan: Vancomycin 750 mg IV q12h (stop date in for 5/27) Zosyn 3.375g IV q8h Monitor clinic progress, WBC, TMax, renal function, electrolytes F/u BCx, C/S, future de-escalation, & length of therapy  Height: 5\' 11"  (180.3 cm) Weight: 187 lb (84.8 kg) IBW/kg (Calculated) : 75.3  Temp (24hrs), Avg:97.8 F (36.6 C), Min:97.5 F (36.4 C), Max:98.2 F (36.8 C)  Recent Labs  Lab 09/04/17 1745 09/04/17 1800 09/04/17 2358 09/05/17 0625 09/06/17 0327  WBC 16.7*  --  18.1* 19.6* 10.7*  CREATININE 2.22*  --   --  1.68* 1.97*  LATICACIDVEN  --  4.44* 1.2 1.1  --     Estimated Creatinine Clearance: 45.7 mL/min (A) (by C-G formula based on SCr of 1.97 mg/dL (H)).    Allergies  Allergen Reactions  . Atorvastatin Other (See Comments)    Muscle aches     Antimicrobials this admission: zosyn 5/24 >>  vanc 5/24>>[5/27]  Dose adjustments this admission: none  Microbiology results: 5/24 BCx: NGTD 5/25 UCx: multiple species 5/26 MRSA PCR: sent  Thank you for allowing pharmacy to be a part of this patient's care.  Tyler Colvard 09/06/2017 2:16 PM

## 2017-09-06 NOTE — Progress Notes (Signed)
PROGRESS NOTE        PATIENT DETAILS Name: DAMEON SOLTIS Age: 52 y.o. Sex: male Date of Birth: 1965/07/31 Admit Date: 09/04/2017 Admitting Physician Harvie Bridge, DO PXT:GGYIRSWNIO, [name], MD  Brief Narrative:  Patient is a 52 y.o. male history of chronic left heel foot ulcer, diabetes CAD status post PCI in 2007-presented to the hospital with weakness, near syncope, discharge from his chronic left foot ulcer-he was found to be hypotensive, and thought to have sepsis from left heel ulceration and underlying osteomyelitis.  See below for further details  Subjective: Patient in bed, appears comfortable, denies any headache, no fever, no chest pain or pressure, no shortness of breath , no abdominal pain. No focal weakness.    Assessment/Plan:  Sepsis secondary to left diabetic foot/heel ulceration with underlying osteomyelitis: Placed on empiric Zosyn and vancomycin, sepsis physiology has resolved, cultures negative thus far, seen by orthopedics Dr. 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  Will check MRSA nares.  If no signs of infection and clean stump might stop antibiotics in the next few days.  Anemia: Appears to have anemia secondary to chronic disease-probably due to chronic left heel ulcer and CKD at baseline, probably worse due to acute illness.  Denies any melena or hematochezia.  Transfused a total of 2 units of PRBC on admission, hemoglobin now stable when accounted for him dilution.  Acute kidney injury on chronic kidney disease stage III: CKD 3 baseline creatinine around 1.4, worsening due to acute sepsis along with bladder outlet obstruction, placed Foley on 09/06/2017, placed on Flomax, his random ultrasound bladder showed a liter of urine.  Will monitor.  Back pain: Occur after fall-also might be some component of back pain due to bladder outlet obstruction, MRI nonacute continue supportive care.  Peripheral neuropathy:  Probably related to diabetes-continue Neurontin.  CAD: No anginal symptoms, resumed aspirin.  Hypertension: All antihypertensives on hold as BP soft.  Follow and resume accordingly.  Hyperlipidemia: Continue statin.  Insulin-dependent DM: CBG is stable-continue with Lantus 15 units daily, and SSI.  Follow and adjust accordingly metformin remains on hold.  CBG (last 3)  Recent Labs    09/06/17 0014 09/06/17 0340 09/06/17 0848  GLUCAP 236* 209* 154*      DVT Prophylaxis: Prophylactic Heparin   Code Status: Full code  Family Communication: Spouse at bedside  Disposition Plan: Remain inpatient-will require several more days of hospitalization prior to discharge  Antimicrobial agents: Anti-infectives (From admission, onward)   Start     Dose/Rate Route Frequency Ordered Stop   09/05/17 1900  vancomycin (VANCOCIN) IVPB 750 mg/150 ml premix     750 mg 150 mL/hr over 60 Minutes Intravenous Every 12 hours 09/05/17 1057     09/05/17 0700  vancomycin (VANCOCIN) IVPB 1000 mg/200 mL premix  Status:  Discontinued     1,000 mg 200 mL/hr over 60 Minutes Intravenous Every 12 hours 09/04/17 1828 09/05/17 1057   09/05/17 0300  piperacillin-tazobactam (ZOSYN) IVPB 3.375 g     3.375 g 12.5 mL/hr over 240 Minutes Intravenous Every 8 hours 09/04/17 1830     09/04/17 1830  vancomycin (VANCOCIN) IVPB 750 mg/150 ml premix     750 mg 150 mL/hr over 60 Minutes Intravenous STAT 09/04/17 1813 09/04/17 2204   09/04/17 1800  piperacillin-tazobactam (ZOSYN) IVPB 3.375 g  3.375 g 100 mL/hr over 30 Minutes Intravenous  Once 09/04/17 1746 09/04/17 1840   09/04/17 1800  vancomycin (VANCOCIN) IVPB 1000 mg/200 mL premix     1,000 mg 200 mL/hr over 60 Minutes Intravenous  Once 09/04/17 1746 09/04/17 2006      Procedures: None  CONSULTS:  pulmonary/intensive care  Time spent: 35  minutes-Greater than 50% of this time was spent in counseling, explanation of diagnosis, planning of further  management, and coordination of care.  MEDICATIONS: Scheduled Meds: . acetaminophen  1,000 mg Oral Q6H  . aspirin EC  81 mg Oral Daily  . brimonidine  1 drop Right Eye BID   And  . timolol  1 drop Right Eye BID  . Difluprednate  1 drop Left Eye QHS  . docusate sodium  100 mg Oral BID  . famotidine  20 mg Oral BID  . gabapentin  600 mg Oral BID  . heparin injection (subcutaneous)  5,000 Units Subcutaneous Q8H  . insulin aspart  0-15 Units Subcutaneous Q4H  . insulin glargine  50 Units Subcutaneous QAC breakfast  . latanoprost  1 drop Both Eyes QHS  . pantoprazole  40 mg Oral Daily  . potassium chloride  40 mEq Oral Q6H  . rosuvastatin  10 mg Oral Daily  . tamsulosin  0.4 mg Oral Daily   Continuous Infusions: . sodium chloride    . lactated ringers Stopped (09/05/17 0300)  . magnesium sulfate 1 - 4 g bolus IVPB 1 g (09/06/17 0926)  . piperacillin-tazobactam (ZOSYN)  IV 3.375 g (09/06/17 0612)  . vancomycin Stopped (09/06/17 0712)   PRN Meds:.acetaminophen **OR** acetaminophen, acetaminophen, HYDROmorphone (DILAUDID) injection, [DISCONTINUED] ondansetron **OR** ondansetron (ZOFRAN) IV, oxyCODONE, traMADol   PHYSICAL EXAM: Vital signs: Vitals:   09/05/17 2100 09/05/17 2130 09/06/17 0130 09/06/17 0441  BP: 108/72 103/74 (!) 94/56   Pulse: 82 83 70   Resp: 14 11 14    Temp:    97.7 F (36.5 C)  TempSrc:    Oral  SpO2: 90% (!) 85% 98%   Weight:      Height:       Filed Weights   09/04/17 1742  Weight: 84.8 kg (187 lb)   Body mass index is 26.08 kg/m.   Exam  Awake Alert, Oriented X 3, No new F.N deficits, Normal affect [place].AT,PERRAL Supple Neck,No JVD, No cervical lymphadenopathy appriciated.  Symmetrical Chest wall movement, Good air movement bilaterally, CTAB RRR,No Gallops, Rubs or new Murmurs, No Parasternal Heave +ve B.Sounds, Abd Soft, No tenderness, mild suprapubic fullness, No organomegaly appriciated, No rebound - guarding or rigidity. No Cyanosis,  Clubbing or edema, No new Rash or bruise, L BKA and a bandage with a small drain in place.  I have personally reviewed following labs and imaging studies  LABORATORY DATA: CBC: Recent Labs  Lab 09/04/17 1745 09/04/17 2358 09/05/17 0625 09/06/17 0327  WBC 16.7* 18.1* 19.6* 10.7*  NEUTROABS 14.9*  --   --   --   HGB 6.1* 8.5* 9.6* 8.5*  HCT 18.7* 24.7* 28.1* 25.7*  MCV 84.2 83.2 82.6 84.8  PLT 299 286 263 254    Basic Metabolic Panel: Recent Labs  Lab 09/04/17 1745 09/04/17 2303 09/05/17 0625 09/06/17 0327  NA 130*  --  129* 130*  K 3.7  --  3.5 3.0*  CL 90*  --  91* 98*  CO2 27  --  28 24  GLUCOSE 258*  --  113* 210*  BUN 23*  --  22*  24*  CREATININE 2.22*  --  1.68* 1.97*  CALCIUM 8.3*  --  8.4* 7.2*  MG  --  1.6*  --   --   PHOS  --  2.0*  --   --     GFR: Estimated Creatinine Clearance: 45.7 mL/min (A) (by C-G formula based on SCr of 1.97 mg/dL (H)).  Liver Function Tests: Recent Labs  Lab 09/04/17 1745 09/05/17 0625  AST 18 23  ALT 8* 13*  ALKPHOS 92 107  BILITOT 0.7 1.8*  PROT 5.7* 5.8*  ALBUMIN 1.4* 1.5*   No results for input(s): LIPASE, AMYLASE in the last 168 hours. No results for input(s): AMMONIA in the last 168 hours.  Coagulation Profile: Recent Labs  Lab 09/04/17 2358  INR 1.32    Cardiac Enzymes: No results for input(s): CKTOTAL, CKMB, CKMBINDEX, TROPONINI in the last 168 hours.  BNP (last 3 results) No results for input(s): PROBNP in the last 8760 hours.  HbA1C: No results for input(s): HGBA1C in the last 72 hours.  CBG: Recent Labs  Lab 09/05/17 1612 09/05/17 2019 09/06/17 0014 09/06/17 0340 09/06/17 0848  GLUCAP 97 167* 236* 209* 154*    Lipid Profile: No results for input(s): CHOL, HDL, LDLCALC, TRIG, CHOLHDL, LDLDIRECT in the last 72 hours.  Thyroid Function Tests: No results for input(s): TSH, T4TOTAL, FREET4, T3FREE, THYROIDAB in the last 72 hours.  Anemia Panel: Recent Labs    09/04/17 2303  09/04/17 2358  VITAMINB12 334  --   FERRITIN 441*  --   TIBC 126*  --   IRON 38*  --   RETICCTPCT  --  1.6    Urine analysis:    Component Value Date/Time   COLORURINE YELLOW 09/05/2017 0404   APPEARANCEUR HAZY (A) 09/05/2017 0404   LABSPEC 1.015 09/05/2017 0404   PHURINE 5.0 09/05/2017 0404   GLUCOSEU >=500 (A) 09/05/2017 0404   HGBUR MODERATE (A) 09/05/2017 0404   BILIRUBINUR NEGATIVE 09/05/2017 0404   KETONESUR 5 (A) 09/05/2017 0404   PROTEINUR 100 (A) 09/05/2017 0404   UROBILINOGEN 0.2 10/22/2006 0948   NITRITE NEGATIVE 09/05/2017 0404   LEUKOCYTESUR NEGATIVE 09/05/2017 0404    Sepsis Labs: Lactic Acid, Venous    Component Value Date/Time   LATICACIDVEN 1.1 09/05/2017 1610    MICROBIOLOGY: Recent Results (from the past 240 hour(s))  Blood Culture (routine x 2)     Status: None (Preliminary result)   Collection Time: 09/04/17  5:45 PM  Result Value Ref Range Status   Specimen Description BLOOD RIGHT ANTECUBITAL  Final   Special Requests   Final    BOTTLES DRAWN AEROBIC AND ANAEROBIC Blood Culture adequate volume   Culture   Final    NO GROWTH 2 DAYS Performed at [facility], [place] [address] [place], [place] [address]    Report Status PENDING  Incomplete  Blood Culture (routine x 2)     Status: None (Preliminary result)   Collection Time: 09/04/17  6:04 PM  Result Value Ref Range Status   Specimen Description BLOOD LEFT FOREARM  Final   Special Requests   Final    BOTTLES DRAWN AEROBIC AND ANAEROBIC Blood Culture results may not be optimal due to an inadequate volume of blood received in culture bottles   Culture   Final    NO GROWTH 2 DAYS Performed at [facility], [place] [address], [place], [place] [address]    Report Status PENDING  Incomplete  Urine culture  Status: Abnormal   Collection Time: 09/05/17  4:04 AM  Result Value Ref Range Status   Specimen Description URINE, RANDOM  Final   Special Requests   Final    NONE Performed at  [facility], [address] [place], [place] [address]    Culture MULTIPLE SPECIES PRESENT, SUGGEST RECOLLECTION (A)  Final   Report Status 09/06/2017 FINAL  Final    RADIOLOGY STUDIES/RESULTS: Dg Sacrum/coccyx  Result Date: 09/04/2017 CLINICAL DATA:  Fall with sacral pain EXAM: SACRUM AND COCCYX - 2+ VIEW COMPARISON:  None. FINDINGS: There is no evidence of fracture or other focal bone lesions. IMPRESSION: Negative. Electronically Signed   By: Donavan Foil M.D.   On: 09/04/2017 19:59   Dg Abd 1 View  Result Date: 08/24/2017 CLINICAL DATA:  Nausea and vomiting EXAM: ABDOMEN - 1 VIEW COMPARISON:  None. FINDINGS: Nonspecific decreased bowel gas. Small amount of gas in the transverse colon. Small amount of stool in the rectum. IMPRESSION: Nonspecific diffuse decreased bowel gas with small amount of gas in the transverse colon. Electronically Signed   By: Donavan Foil M.D.   On: 08/24/2017 00:37   Mr Lumbar Spine Wo Contrast  Result Date: 09/05/2017 CLINICAL DATA:  Recent fall. Weakness. Diabetic. DKA. Foot osteomyelitis. EXAM: MRI LUMBAR SPINE WITHOUT CONTRAST TECHNIQUE: Multiplanar, multisequence MR imaging of the lumbar spine was performed. No intravenous contrast was administered. COMPARISON:  None. FINDINGS: Segmentation:  Standard. Alignment:  Anatomic. Vertebrae:  No worrisome osseous lesion. Conus medullaris and cauda equina: Conus extends to the L1. level. Conus and cauda equina appear normal. Paraspinal and other soft tissues: Marked bladder distention, query bladder outlet obstruction or neurogenic dysfunction. Disc levels: L1-L2:  Normal. L2-L3:  Normal. L3-L4:  Normal. L4-L5: Central and leftward protrusion. Facet arthropathy. Mild stenosis. Definite impingement not established. L5-S1: Annular bulge. Facet arthropathy. No impingement. In the ventral epidural space opposite S1, there is a T1 and T2 hypointense structure, probable venous plexus although a small caudally extruded  disc fragment not completely excluded. No signs of epidural hematoma. No significant cauda equina compression. IMPRESSION: No lumbar compression fracture or traumatic subluxation. Central and leftward protrusion at L4-5 with mild stenosis, but no definite impingement. Annular bulge L5-S1. Small ventral epidural T1 and T2 hypointense structure behind S1, could represent caudally extruded disc fragment, but no significant cauda equina compression. Marked bladder distention, query bladder outlet obstruction or neurogenic dysfunction. Electronically Signed   By: Staci Righter M.D.   On: 09/05/2017 11:37   Ct Foot Left Wo Contrast  Result Date: 08/23/2017 CLINICAL DATA:  Diabetic patient with an ulceration on the left heel. EXAM: CT OF THE LEFT FOOT WITHOUT CONTRAST TECHNIQUE: Multidetector CT imaging of the left foot was performed according to the standard protocol. Multiplanar CT image reconstructions were also generated. COMPARISON:  Plain films of the left foot earlier today and 07/10/2016. MRI left ankle 05/20/2016. FINDINGS: Bones/Joint/Cartilage The patient has a remote healed fracture of the posterior, superior calcaneus. Irregularity of cortical bone along the inferior margin of the calcaneus is noted but no cortical destructive change or periosteal reaction is identified. There is some sclerosis of the calcaneus eccentric toward the medial side. No fracture or dislocation. Ligaments Suboptimally assessed by CT. Muscles and Tendons Intact. Soft tissues Large skin ulceration over the heel is identified. No underlying fluid collection is present. No radiopaque foreign body or soft tissue gas. Subcutaneous edema about the ankle is noted. Atherosclerosis is noted. IMPRESSION: Skin ulceration on the heel  without CT evidence of underlying osteomyelitis. Negative for abscess. Healed fracture of dorsal calcaneus. Atherosclerosis. Electronically Signed   By: Inge Rise M.D.   On: 08/23/2017 17:20   Mr Foot  Left Wo Contrast  Result Date: 09/05/2017 CLINICAL DATA:  Chronic open wound along the left heel. Diffuse swelling in the foot extending into the toes. Diabetes. EXAM: MRI OF THE LEFT HINDFOOT WITHOUT CONTRAST TECHNIQUE: Multiplanar, multisequence MR imaging of the ankle was performed. No intravenous contrast was administered. COMPARISON:  09/04/2017 FINDINGS: TENDONS Peroneal: Mild tendinopathy of the peroneus brevis. Posteromedial: Unremarkable Anterior: Unremarkable Achilles: Moderate distal Achilles tendinopathy with expansion and increased signal in the tendon. Plantar Fascia: Prominent thickening of the medial and lateral band of the plantar fascia with poor visualization and attenuation of the connection to the plantar calcaneus. This does not represent a total rupture given the apparent continuity on image 17/6, but may represent partial tearing. There is a soft tissue deficiency in this region overlying the plantar calcaneus. LIGAMENTS Lateral: Unremarkable although there appear to be fragmented osteophytes/ossicles deep to the anterior talofibular ligament on images 16-21 of series 4. Medial: Unremarkable CARTILAGE Ankle Joint: Unremarkable Subtalar Joints/Sinus Tarsi: Unremarkable Bones: Abnormal edema compatible with active osteomyelitis involving the posterior calcaneus extending as far forward as the calcaneal neck. Other: Prominent loss of soft tissue thickness along the posterior and plantar portion of the posterior calcaneus compatible with notable ulceration down nearly to the periosteum. Diffuse subcutaneous edema in the ankle tracking into the foot especially dorsally. Speckled low densities in the soft tissues superficial to the plantar fascia and extending distally in the foot, compatible with gas tracking in the soft tissues. This extends at least to the distal metatarsal level. IMPRESSION: 1. 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 There is abnormal gas tracking along the plantar fascia and into the forefoot, extending at least as far as the level of the distal metatarsals, probably reflecting fasciitis. I do not see a drainable soft tissue abscess. 2. The osteomyelitis as undermined the plantar fascia connection to the calcaneus, which is thought to be significantly partially detached although not totally ruptured. 3. Moderate Achilles tendinopathy distally. 4. Abnormal edema in the ankle and foot suggesting cellulitis. 5. Mild peroneus brevis tendinopathy. Electronically Signed   By: Van Clines M.D.   On: 09/05/2017 08:08   Dg Chest Port 1 View  Result Date: 09/04/2017 CLINICAL DATA:  Sepsis EXAM: PORTABLE CHEST 1 VIEW COMPARISON:  09/04/2015 CXR FINDINGS: The heart size and mediastinal contours are within normal limits. Minimal aortic atherosclerosis is noted. Both lungs are clear. ACDF hardware across the C5-6 interspace. The visualized skeletal structures are unremarkable. IMPRESSION: No active disease. Electronically Signed   By: Ashley Royalty M.D.   On: 09/04/2017 18:30   Dg Foot 2 Views Left  Result Date: 09/04/2017 CLINICAL DATA:  Sepsis EXAM: LEFT FOOT - 2 VIEW COMPARISON:  08/23/2017 radiographs and CT FINDINGS: Soft tissue wound along the plantar aspect of the heel. Plantar calcaneal cortical irregularity is stable subjacent to skin ulceration. There is persistent soft tissue swelling along the plantar aspect of the mid and hindfoot though less so since prior. Interval increase in soft tissue swelling is noted of the forefoot however since prior. Lucencies along the plantar aspect of the foot may represent soft tissue emphysema. Remote calcaneal fracture with healing redemonstrated. Degenerative joint space narrowing of DIP and PIP joints of the second through fifth digits, first through  fifth MTP articulations and midfoot. IMPRESSION: 1. Redemonstration of large soft tissue defect along the plantar  aspect of the calcaneus with cortical irregularity and osteopenia subjacent to the skin wound compatible with chronic changes of osteomyelitis possibly acute on chronic. No significant bony changes are identified. 2. Soft tissue swelling along the plantar aspect of the mid and hindfoot persists with subcutaneous soft tissue emphysema suggested with decreased swelling along the plantar and dorsal aspect of the foot but increased along the dorsum and plantar aspect of the forefoot. Electronically Signed   By: Ashley Royalty M.D.   On: 09/04/2017 18:37   Dg Foot 2 Views Left  Result Date: 08/23/2017 CLINICAL DATA:  Diabetic patient with a nonhealing wound on the left heel. The wound is chronic. EXAM: LEFT FOOT - 2 VIEW COMPARISON:  Plain films of the left foot 07/10/2016. FINDINGS: There is a large skin wound on the plantar surface of the heel. Previously seen fracture of the dorsal calcaneus has healed. There is sclerosis in the calcaneus and irregularity of cortical bone deep to the patient's skin ulceration. No radiopaque foreign body. Atherosclerosis noted. 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 Healed calcaneal fracture. Electronically Signed   By: Inge Rise M.D.   On: 08/23/2017 14:35     LOS: 2 days   Signature  Lala Lund M.D on 09/06/2017 at 10:15 AM  Between 7am to 7pm - Pager - [phone] ( page via [email or web address], text pages only, please mention full 10 digit call back number).  After 7pm go to [email or web address] - password [email or web address]     09/06/2017, 10:15 AM

## 2017-09-06 NOTE — Evaluation (Signed)
Physical Therapy Evaluation Patient Details Name: Chase Clark MRN: [id number] DOB: [birth year] Today's Date: 09/06/2017   History of Present Illness  Pt is a 52 yo male admitted through ED on 09/04/17 with continued weakness. Pt has recently been discharged from [facility] following [place] and ICU stay before returning home for 1 daty and returning to hospital. Pt had a chronic L diabetic foot ulcer with chronic osteomyelitis which had causes sepsis. Pt decided to undergo amputation on 09/05/17. PMH significant for CAD, DM, GERD, HTN, HLD, anemia.  Clinical Impression  Pt presents with the above diagnosis and below deficits for therapy evaluation. Prior to admission and recent disease exacerbation, pt was completely independent and working full time for his own [employer], [employer]. Pt lives with is wife in a two story home with their main living environment on the first floor. Pt's wife is an Therapist, sports and works full time and recently changed jobs and is unable to be home with pt full time upon discharge. Pt is able to perform bed mobility and transfers with Min A this session with increased pain in low back around sacrum from fall prior to admission resulting in bruising to sacral region. No fracture or other abnormality in lumbar spine is noted via xray, CT or MRI. Pt will benefit from continued acute therapy services in order to address the below deficits in order to assist with a smooth transition to the next venue of care. Pt will benefit form consult from CIR in order to assist with maximizing his progress before returning home.     Follow Up Recommendations CIR    Equipment Recommendations  Other (comment)(defer to next venue)    Recommendations for Other Services Rehab consult;OT consult     Precautions / Restrictions Precautions Precautions: Fall Restrictions Weight Bearing Restrictions: No      Mobility  Bed Mobility Overal bed mobility: Needs Assistance Bed Mobility: Supine to Sit;Sit to  Supine     Supine to sit: Min assist;HOB elevated Sit to supine: Min assist   General bed mobility comments: MIn A for safety   Transfers Overall transfer level: Needs assistance Equipment used: Rolling walker (2 wheeled) Transfers: Sit to/from [facility] Sit to Stand: Min assist;From elevated surface Stand pivot transfers: Min assist       General transfer comment: Min A with RW to stand and pivot transfer from bed <> recliner  Ambulation/Gait             General Gait Details: not assessed  Stairs            Wheelchair Mobility    Modified Rankin (Stroke Patients Only)       Balance Overall balance assessment: Needs assistance Sitting-balance support: Bilateral upper extremity supported;Feet supported Sitting balance-Leahy Scale: Fair     Standing balance support: Bilateral upper extremity supported Standing balance-Leahy Scale: Poor Standing balance comment: reliant on UE support in standing                             Pertinent Vitals/Pain Pain Assessment: 0-10 Pain Score: 7  Pain Location: coccyx  Pain Descriptors / Indicators: Aching Pain Intervention(s): Monitored during session;Repositioned    Home Living Family/patient expects to be discharged to:: Private residence Living Arrangements: Spouse/significant other Available Help at Discharge: Family;Available 24 hours/day Type of Home: House Home Access: Stairs to enter Entrance Stairs-Rails: Right Entrance Stairs-Number of Steps: 2 in front and 4 into garage Home Layout: Two  level;Able to live on main level with bedroom/bathroom Home Equipment: Gilford Rile - 2 wheels;Transport chair Additional Comments: has adjustable tempurpedic    Prior Function Level of Independence: Independent with assistive device(s)         Comments: 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 Was completely independent prior to admission     Hand Dominance   Dominant Hand:  Right    Extremity/Trunk Assessment   Upper Extremity Assessment Upper Extremity Assessment: Defer to OT evaluation    Lower Extremity Assessment Lower Extremity Assessment: LLE deficits/detail LLE Deficits / Details: BKA    Cervical / Trunk Assessment Cervical / Trunk Assessment: Normal  Communication   Communication: No difficulties  Cognition Arousal/Alertness: Lethargic Behavior During Therapy: WFL for tasks assessed/performed Overall Cognitive Status: Impaired/Different from baseline Area of Impairment: Attention;Memory                   Current Attention Level: Alternating Memory: Decreased short-term memory                General Comments      Exercises     Assessment/Plan    PT Assessment Patient needs continued PT services  PT Problem List Decreased strength;Decreased activity tolerance;Decreased balance;Decreased mobility;Decreased knowledge of use of DME;Pain       PT Treatment Interventions DME instruction;Gait training;Functional mobility training;Therapeutic activities;Therapeutic exercise;Balance training;Neuromuscular re-education;Patient/family education    PT Goals (Current goals can be found in the Care Plan section)  Acute Rehab PT Goals Patient Stated Goal: to have less pain PT Goal Formulation: With patient/family Time For Goal Achievement: 09/20/17 Potential to Achieve Goals: Good    Frequency Min 3X/week   Barriers to discharge Decreased caregiver support wife works full time    Co-evaluation               AM-PAC PT "6 Clicks" Daily Activity  Outcome Measure Difficulty turning over in bed (including adjusting bedclothes, sheets and blankets)?: Unable Difficulty moving from lying on back to sitting on the side of the bed? : Unable Difficulty sitting down on and standing up from a chair with arms (e.g., wheelchair, bedside commode, etc,.)?: A Lot Help needed moving to and from a bed to chair (including a  wheelchair)?: A Lot Help needed walking in hospital room?: Total Help needed climbing 3-5 steps with a railing? : Total 6 Click Score: 8    End of Session Equipment Utilized During Treatment: Gait belt Activity Tolerance: Patient tolerated treatment well;Patient limited by pain;Patient limited by fatigue Patient left: in bed;with call bell/phone within reach;with family/visitor present Nurse Communication: Mobility status PT Visit Diagnosis: Unsteadiness on feet (R26.81);Other abnormalities of gait and mobility (R26.89);Muscle weakness (generalized) (M62.81);Difficulty in walking, not elsewhere classified (R26.2);Pain Pain - Right/Left: Right(low back) Pain - part of body: (back)    Time: 1101-1146 PT Time Calculation (min) (ACUTE ONLY): 45 min   Charges:   PT Evaluation $PT Eval Moderate Complexity: 1 Mod PT Treatments $Therapeutic Activity: 23-37 mins   PT G Codes:        Scheryl Marten PT, DPT     Sabra Sloan Leiter 09/06/2017, 12:32 PM

## 2017-09-07 ENCOUNTER — Inpatient Hospital Stay (HOSPITAL_COMMUNITY): Payer: BLUE CROSS/BLUE SHIELD

## 2017-09-07 DIAGNOSIS — E119 Type 2 diabetes mellitus without complications: Secondary | ICD-10-CM

## 2017-09-07 DIAGNOSIS — D62 Acute posthemorrhagic anemia: Secondary | ICD-10-CM

## 2017-09-07 DIAGNOSIS — I10 Essential (primary) hypertension: Secondary | ICD-10-CM

## 2017-09-07 DIAGNOSIS — D72829 Elevated white blood cell count, unspecified: Secondary | ICD-10-CM

## 2017-09-07 DIAGNOSIS — I2510 Atherosclerotic heart disease of native coronary artery without angina pectoris: Secondary | ICD-10-CM

## 2017-09-07 DIAGNOSIS — G8918 Other acute postprocedural pain: Secondary | ICD-10-CM

## 2017-09-07 DIAGNOSIS — D638 Anemia in other chronic diseases classified elsewhere: Secondary | ICD-10-CM

## 2017-09-07 DIAGNOSIS — M869 Osteomyelitis, unspecified: Secondary | ICD-10-CM

## 2017-09-07 LAB — BASIC METABOLIC PANEL
Anion gap: 8 (ref 5–15)
BUN: 22 mg/dL — ABNORMAL HIGH (ref 6–20)
CO2: 26 mmol/L (ref 22–32)
Calcium: 7.9 mg/dL — ABNORMAL LOW (ref 8.9–10.3)
Chloride: 101 mmol/L (ref 101–111)
Creatinine, Ser: 2.09 mg/dL — ABNORMAL HIGH (ref 0.61–1.24)
GFR calc Af Amer: 40 mL/min — ABNORMAL LOW (ref >60)
GFR calc non Af Amer: 34 mL/min — ABNORMAL LOW (ref >60)
Glucose, Bld: 120 mg/dL — ABNORMAL HIGH (ref 65–99)
Potassium: 4.6 mmol/L (ref 3.5–5.1)
Sodium: 135 mmol/L (ref 135–145)

## 2017-09-07 LAB — CBC
HCT: 29.2 % — ABNORMAL LOW (ref 39.0–52.0)
Hemoglobin: 9.3 g/dL — ABNORMAL LOW (ref 13.0–17.0)
MCH: 27.6 pg (ref 26.0–34.0)
MCHC: 31.8 g/dL (ref 30.0–36.0)
MCV: 86.6 fL (ref 78.0–100.0)
Platelets: 310 10*3/uL (ref 150–400)
RBC: 3.37 MIL/uL — ABNORMAL LOW (ref 4.22–5.81)
RDW: 13.9 % (ref 11.5–15.5)
WBC: 11.3 10*3/uL — ABNORMAL HIGH (ref 4.0–10.5)

## 2017-09-07 LAB — GLUCOSE, CAPILLARY
Glucose-Capillary: 109 mg/dL — ABNORMAL HIGH (ref 65–99)
Glucose-Capillary: 129 mg/dL — ABNORMAL HIGH (ref 65–99)
Glucose-Capillary: 163 mg/dL — ABNORMAL HIGH (ref 65–99)
Glucose-Capillary: 209 mg/dL — ABNORMAL HIGH (ref 65–99)
Glucose-Capillary: 264 mg/dL — ABNORMAL HIGH (ref 65–99)
Glucose-Capillary: 329 mg/dL — ABNORMAL HIGH (ref 65–99)

## 2017-09-07 LAB — MAGNESIUM: Magnesium: 2.5 mg/dL — ABNORMAL HIGH (ref 1.7–2.4)

## 2017-09-07 MED ORDER — POLYETHYLENE GLYCOL 3350 17 G PO PACK
17.0000 g | PACK | Freq: Two times a day (BID) | ORAL | Status: DC
  Filled 2017-09-07: qty 1

## 2017-09-07 MED ORDER — BISACODYL 10 MG RE SUPP
10.0000 mg | Freq: Once | RECTAL | Status: DC
  Filled 2017-09-07: qty 1

## 2017-09-07 MED ORDER — SODIUM CHLORIDE 0.9 % IV SOLN
INTRAVENOUS | Status: AC
  Administered 2017-09-07: 15:00:00 via INTRAVENOUS

## 2017-09-07 MED ORDER — MAGNESIUM HYDROXIDE 400 MG/5ML PO SUSP
30.0000 mL | Freq: Two times a day (BID) | ORAL | Status: DC
  Filled 2017-09-07: qty 30

## 2017-09-07 NOTE — Evaluation (Signed)
Occupational Therapy Evaluation Patient Details Name: Chase Clark MRN: [id number] DOB: January 18, 1966 Today's Date: 09/07/2017    History of Present Illness Pt is a 52 yo male admitted through ED on 09/04/17 with continued weakness. Pt has recently been discharged from [facility] following [place] and ICU stay before returning home for 1 daty and returning to hospital. Pt had a chronic L diabetic foot ulcer with chronic osteomyelitis which had causes sepsis. Pt decided to undergo amputation on 09/05/17. PMH significant for CAD, DM, GERD, HTN, HLD, anemia.   Clinical Impression   This 52 y/o male presents with the above. Prior to initial admission, pt was independent with ADLs, iADLs and functional mobility. Pt completing stand pivot transfers with overall MinA this session using RW; currently requires ModA for LB ADLs and MaxA for toileting ADLs. Despite increased pain and fatigue levels this session, pt demonstrates good motivation to participate with therapy and progress to PLOF. Feel pt is appropriate candidate for CIR level services. Will continue to follow acutely to maximize pt's safety and independence with ADLs and mobility prior to discharge.     Follow Up Recommendations  CIR;Supervision/Assistance - 24 hour    Equipment Recommendations  Other (comment)(TBD in next venue )           Precautions / Restrictions Precautions Precautions: Fall Restrictions Weight Bearing Restrictions: No Other Position/Activity Restrictions: assume NWB in LLE       Mobility Bed Mobility Overal bed mobility: Needs Assistance Bed Mobility: Supine to Sit;Sit to Sidelying     Supine to sit: Min assist;HOB elevated   Sit to sidelying: Min assist General bed mobility comments: MIn A to elevate trunk into sitting, pt requiring cues for sequencing and for use of UEs to assist; minA for RLE when returning to sidelying; educated on use of log roll technique due to back pain with pt verbalizing  understanding  Transfers Overall transfer level: Needs assistance   Transfers: Sit to/from Stand;Stand Pivot Transfers Sit to Stand: Mod assist;Min assist;From elevated surface Stand pivot transfers: Min assist       General transfer comment: ModA initially from EOB to rise and steady at RW, Pt able to stand from [facility] with MinA; min steadying assist to take small pivotal hops EOB<>BSC; VCs for sequencing RW use and for hand placement during transfers     Balance Overall balance assessment: Needs assistance Sitting-balance support: Bilateral upper extremity supported;Feet supported Sitting balance-Leahy Scale: Fair     Standing balance support: Bilateral upper extremity supported Standing balance-Leahy Scale: Poor Standing balance comment: reliant on UE support in standing                           ADL either performed or assessed with clinical judgement   ADL Overall ADL's : Needs assistance/impaired Eating/Feeding: Modified independent;Sitting   Grooming: Set up;Sitting   Upper Body Bathing: Min guard;Sitting   Lower Body Bathing: Moderate assistance;Sitting/lateral leans;Sit to/from stand   Upper Body Dressing : Min guard;Sitting   Lower Body Dressing: Moderate assistance;Sitting/lateral leans;Sit to/from stand   Toilet Transfer: Minimal assistance;Stand-pivot;BSC;RW   Toileting- Clothing Manipulation and Hygiene: Maximal assistance;Sit to/from stand Toileting - Clothing Manipulation Details (indicate cue type and reason): assist for peri-care after BM while standing at RW      Functional mobility during ADLs: Minimal assistance;Rolling walker(stand pivot transfer) General ADL Comments: pt with increased pain levels this session and fatigued, assisted to [facility] for toileting and back to bed end  of session                          Pertinent Vitals/Pain Pain Assessment: Faces Faces Pain Scale: Hurts whole lot Pain Location: coccyx, back  Pain  Descriptors / Indicators: Aching;Grimacing;Guarding;Sore Pain Intervention(s): Monitored during session;Repositioned;Limited activity within patient's tolerance     Hand Dominance Right   Extremity/Trunk Assessment Upper Extremity Assessment Upper Extremity Assessment: Overall WFL for tasks assessed   Lower Extremity Assessment Lower Extremity Assessment: Defer to PT evaluation   Cervical / Trunk Assessment Cervical / Trunk Assessment: Normal   Communication Communication Communication: No difficulties   Cognition Arousal/Alertness: Lethargic Behavior During Therapy: WFL for tasks assessed/performed Overall Cognitive Status: Impaired/Different from baseline Area of Impairment: Attention;Memory                   Current Attention Level: Alternating Memory: Decreased short-term memory         General Comments: suspect partly due to pain and lack of sleep                    Home Living Family/patient expects to be discharged to:: Private residence Living Arrangements: Spouse/significant other Available Help at Discharge: Family Type of Home: House Home Access: Stairs to enter [employer] of Steps: 2 in front and 4 into garage Entrance Stairs-Rails: Right Home Layout: Two level;Able to live on main level with bedroom/bathroom     Bathroom Shower/Tub: Walk-in shower;Door   [employer] Toilet: Standard Bathroom Accessibility: Yes   Home Equipment: Environmental consultant - 2 wheels;Transport chair   Additional Comments: has adjustable tempurpedic      Prior Functioning/Environment Level of Independence: Independent with assistive device(s)        Comments: 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 Was completely independent prior to initial admission        OT Problem List: Decreased strength;Impaired balance (sitting and/or standing);Pain;Decreased range of motion;Decreased activity tolerance;Decreased knowledge of use of DME or AE      OT  Treatment/Interventions: Self-care/ADL training;DME and/or AE instruction;Therapeutic activities;Balance training;Therapeutic exercise;Patient/family education    OT Goals(Current goals can be found in the care plan section) Acute Rehab OT Goals Patient Stated Goal: to have less pain OT Goal Formulation: With patient Time For Goal Achievement: 09/21/17 Potential to Achieve Goals: Good  OT Frequency: Min 2X/week                             AM-PAC PT "6 Clicks" Daily Activity     Outcome Measure Help from another person eating meals?: None Help from another person taking care of personal grooming?: A Little Help from another person toileting, which includes using toliet, bedpan, or urinal?: A Lot Help from another person bathing (including washing, rinsing, drying)?: A Lot Help from another person to put on and taking off regular upper body clothing?: None Help from another person to put on and taking off regular lower body clothing?: A Lot 6 Click Score: 17   End of Session Equipment Utilized During Treatment: Gait belt;Rolling walker Nurse Communication: Mobility status  Activity Tolerance: Patient tolerated treatment well;Patient limited by pain Patient left: in bed;with call bell/phone within reach;with bed alarm set;with nursing/sitter in room;with family/visitor present  OT Visit Diagnosis: Unsteadiness on feet (R26.81);Other abnormalities of gait and mobility (R26.89)                Time: 1779-3903 OT  Time Calculation (min): 35 min Charges:  OT General Charges $OT Visit: 1 Visit OT Evaluation $OT Eval Moderate Complexity: 1 Mod OT Treatments $Self Care/Home Management : 8-22 mins G-Codes:     Lou Cal, OT Pager [phone] 09/07/2017   Raymondo Band 09/07/2017, 11:07 AM

## 2017-09-07 NOTE — Consult Note (Signed)
Physical Medicine and Rehabilitation Consult Reason for Consult: Decreased functional mobility Referring Physician: [facility]   HPI: Chase Clark is a 52 y.o. right-handed male with history of CAD, diabetes mellitus, hypertension, chronic left foot ulcer followed at the wound center in [facility] with frequent debridements.  Per chart review, patient, and daughter, patient lives with spouse.  Reported to be independent with assistive device prior to admission.  2 level home with bedroom on [address].  2 steps to entry.  Wife works during the day.  Presented 09/04/2017 with generalized weakness and near syncope/fall.  Patient with recent admission approximately 1 week ago for DKA at [facility] discharged to home returning the following day with weakness.  Noted complaints of back pain.  Chest x-ray negative.  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  MRI lumbar spine reviewed, unremarkable for acute process. Noted WBC 16,700, lactic acid 4.44.  Suspect sepsis related to left foot wound.  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 [name].  Hospital course pain management.  Subcutaneous heparin for DVT prophylaxis.  Acute on chronic anemia 9.3 and monitored.  Patient currently remains on vancomycin and Zosyn for sepsis protocol.  Physical therapy evaluation completed 09/06/2017 with recommendations of physical medicine rehab consult.   Review of Systems  Constitutional: Negative for fever.  HENT: Negative for hearing loss.   Eyes: Negative for blurred vision and double vision.  Respiratory: Negative for cough and shortness of breath.   Cardiovascular: Positive for leg swelling. Negative for chest pain and palpitations.  Gastrointestinal: Positive for constipation. Negative for nausea and vomiting.       GERD  Genitourinary: Negative for dysuria, flank pain and hematuria.  Musculoskeletal: Positive for joint pain and myalgias.  Skin: Negative for rash.  Neurological:  Positive for dizziness and sensory change.  All other systems reviewed and are negative.  Past Medical History:  Diagnosis Date  . Arthritis   . Asthma    as child  . Coronary artery disease    DES DIAG1 11/20/09 [facility])  . Diabetes mellitus without complication ([place])   . Diverticulosis   . Edema, lower extremity   . Fatty liver   . GERD (gastroesophageal reflux disease)   . Gilbert's disease   . Glaucoma   . Heart disease 2011   Patient has stent for 80% blockage  . Hyperlipidemia   . Hypertension   . Neuropathy   . Seasonal allergies   . Shoulder pain   . Ulcer of foot ([place]) 08.06.2014   DIABETIC ULCERATIONS ASSOCIATED WITH IRRITATION LATERAL ANKLE LEFT GREATER THAN RIGHT WITH MILD CELLULITIS   Past Surgical History:  Procedure Laterality Date  . ABDOMINAL AORTOGRAM N/A 05/20/2016   Procedure: Abdominal Aortogram possible intervention;  Surgeon: Katha Cabal, MD;  Location: [place] [facility];  Service: Cardiovascular;  Laterality: N/A;  . AMPUTATION Left 09/05/2017   Procedure: AMPUTATION BELOW KNEE;  Surgeon: Tania Ade, MD;  Location: [place];  Service: Orthopedics;  Laterality: Left;  . ANTERIOR CERVICAL DECOMP/DISCECTOMY FUSION N/A 05/07/2017   Procedure: ANTERIOR CERVICAL DECOMPRESSION/DISCECTOMY Luevenia Maxin PROSTHESIS,PLATE/SCREWS CERVICAL FIVE - CERVICAL SIX;  Surgeon: Newman Pies, MD;  Location: [place];  Service: Neurosurgery;  Laterality: N/A;  . CARDIAC CATHETERIZATION    . CATARACT EXTRACTION W/ INTRAOCULAR LENS IMPLANT    . CATARACT EXTRACTION W/PHACO Left 02/26/2017   Procedure: CATARACT EXTRACTION PHACO AND INTRAOCULAR LENS PLACEMENT (IOC);  Surgeon: Eulogio Bear, MD;  Location: [facility];  Service: Ophthalmology;  Laterality: Left;  Lot # [id number] H [place]: 00:25.3 AP%: 6.2 CDE: 1.59   . CHOLECYSTECTOMY N/A   . CORONARY ANGIOPLASTY WITH STENT PLACEMENT  2007   1st diagonal  . EPIBLEPHERON REPAIR WITH TEAR DUCT PROBING    . EYE SURGERY Right  sept [id number]   cataract extraction  . INSERTION EXPRESS TUBE SHUNT Right 09/06/2015   Procedure: INSERTION AHMED TUBE SHUNT with tutoplast allograft;  Surgeon: Eulogio Bear, MD;  Location: [facility];  Service: Ophthalmology;  Laterality: Right;  Marland Kitchen VASECTOMY     Family History  Problem Relation Age of Onset  . Hyperlipidemia Mother   . Diabetes Mother   . Liver disease Mother        NASH  . Hyperlipidemia Father   . Diabetes Father   . Heart disease Father   . Hypertension Father    Social History:  reports that he quit smoking about 9 years ago. His smoking use included cigarettes. He has never used smokeless tobacco. He reports that he drinks alcohol. He reports that he does not use drugs. Allergies:  Allergies  Allergen Reactions  . Atorvastatin Other (See Comments)    Muscle aches    Medications Prior to Admission  Medication Sig Dispense Refill  . aspirin EC 81 MG tablet Take 81 mg by mouth daily.    . brimonidine-timolol (COMBIGAN) 0.2-0.5 % ophthalmic solution Place 1 drop into the right eye every 12 (twelve) hours.     . DUREZOL 0.05 % EMUL Place 1 drop into the left eye at bedtime  1  . esomeprazole (NEXIUM) 40 MG capsule Take 1 capsule (40 mg total) by mouth daily. (Patient taking differently: Take 40 mg by mouth daily. )    . furosemide (LASIX) 40 MG tablet Take 40 mg by mouth daily as needed for fluid or edema.   11  . gabapentin (NEURONTIN) 300 MG capsule Take 2 capsules (600 mg total) by mouth 2 (two) times daily. 360 capsule 3  . insulin aspart (NOVOLOG FLEXPEN) 100 UNIT/ML FlexPen CBG121-150: 1u; CBG151-200: 2u; CBG201-250: 3u; CBG251-300: 5u; CBG301-350: 7u; CBG351-400: 9u (Patient taking differently: Inject 1-9 Units into the skin See admin instructions. Inject 1-9 units into the skin three times a day before meals, per sliding scale: BGL 121-150 = 1 unit; 151-200 = 2 units; 201-250 = 3 units; 251-300 = 5 units; 301-350 = 7 units; 351-400 = 9 units) 5 pen 0  .  insulin glargine (LANTUS) 100 UNIT/ML injection Inject 0.5 mLs (50 Units total) into the skin daily. (Patient taking differently: Inject 50 Units into the skin daily before breakfast. )    . latanoprost (XALATAN) 0.005 % ophthalmic solution Place 1 drop into both eyes at bedtime.  5  . losartan-hydrochlorothiazide (HYZAAR) 100-25 MG tablet Take 1 tablet by mouth daily.  11  . Menthol, Topical Analgesic, (BLUE-EMU MAXIMUM STRENGTH EX) Apply 1 application topically See admin instructions. Apply to the back daily as directed for pain    . metFORMIN (GLUCOPHAGE) 500 MG tablet Take 500 mg by mouth 2 (two) times daily with a meal.    . metoprolol tartrate (LOPRESSOR) 100 MG tablet Take 1 tablet (100 mg total) by mouth 2 (two) times daily. 180 tablet 1  . rosuvastatin (CRESTOR) 10 MG tablet Take 10 mg by mouth daily.  3  . Continuous Blood Gluc Receiver ([place]) DEVI 1 each by Does not apply route as directed. Use to check blood glucose 5 times daily 1  Device 0  . Continuous Blood Gluc Sensor (DEXCOM G6 SENSOR) MISC 1 each by Does not apply route as directed. Place 1 sensor subcutaneously once every 10 days. 3 each 11  . Continuous Blood Gluc Transmit (DEXCOM G6 TRANSMITTER) MISC 1 each by Does not apply route as directed. Use to check blood glucose 5 times daily. Refill every 3 months. 1 each 3  . Insulin Pen Needle (PEN NEEDLES) 32G X 4 MM MISC Inject 90 each 3 (three) times daily into the skin. 100 each 11  . metoCLOPramide (REGLAN) 5 MG tablet Take 1 tablet (5 mg total) by mouth 4 (four) times daily -  before meals and at bedtime. 120 tablet 0  . oxyCODONE 10 MG TABS Take 1 tablet (10 mg total) by mouth every 4 (four) hours as needed for severe pain ((score 7 to 10)). (Patient not taking: Reported on 09/04/2017) 30 tablet 0  . rosuvastatin (CRESTOR) 20 MG tablet Take 1 tablet (20 mg total) daily by mouth. (Patient not taking: Reported on 09/04/2017) 30 tablet 11    Home: [place] expects to be discharged to:: Private residence Living Arrangements: Spouse/significant other Available Help at Discharge: Family, Available 24 hours/day Type of Home: House Home Access: Stairs to enter [employer] of Steps: 2 in front and 4 into garage Entrance Stairs-Rails: Right Home Layout: Two level, Able to live on main level with bedroom/bathroom Bathroom Shower/Tub: Gaffer, Door [employer] Toilet: Standard Bathroom Accessibility: Yes Home Equipment: Environmental consultant - 2 wheels, Transport chair Additional Comments: has adjustable tempurpedic  Functional History: Prior Function Level of Independence: Independent with assistive device(s) Comments: 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 Was completely independent prior to admission Functional Status:  Mobility: Bed Mobility Overal bed mobility: Needs Assistance Bed Mobility: Supine to Sit, Sit to Supine Supine to sit: Min assist, HOB elevated Sit to supine: Min assist General bed mobility comments: MIn A for safety  Transfers Overall transfer level: Needs assistance Equipment used: Rolling walker (2 wheeled) Transfers: Sit to/from Stand, [employer] Transfers Sit to Stand: Min assist, From elevated surface Stand pivot transfers: Min assist General transfer comment: Min A with RW to stand and pivot transfer from bed <> recliner Ambulation/Gait General Gait Details: not assessed    ADL:    Cognition: Cognition Overall Cognitive Status: Impaired/Different from baseline Orientation Level: Oriented X4 Cognition Arousal/Alertness: Lethargic Behavior During Therapy: WFL for tasks assessed/performed Overall Cognitive Status: Impaired/Different from baseline Area of Impairment: Attention, Memory Current Attention Level: Alternating Memory: Decreased short-term memory  Blood pressure (!) 124/92, pulse 95, temperature 100.1 F (37.8 C), temperature source Oral, resp. rate 14,  height 5\' 11"  (1.803 m), weight 84.8 kg (187 lb), SpO2 98 %. Physical Exam  Vitals reviewed. Constitutional: He appears well-developed and well-nourished.  HENT:  Head: Normocephalic and atraumatic.  Eyes: EOM are normal. Right eye exhibits no discharge. Left eye exhibits no discharge.  Neck: Normal range of motion. Neck supple. No thyromegaly present.  Cardiovascular: Regular rhythm and normal heart sounds.  +Tachycardia  Respiratory: Effort normal and breath sounds normal. No respiratory distress.  GI: Soft. Bowel sounds are normal. He exhibits no distension.  Musculoskeletal:  Left stump with tenderness Left hand edema  Neurological: He is alert.  Lethargic but arousable.   He provides his name, age and date of birth. Motor: B/l UE 5/5 proximal to distal RLE: 4/5 proximal to distal (pain inhibition) LLE: HF 4/5 (pain inhibition)  Skin: Skin is warm and dry.  BKA site is dressed with drain in place  Psychiatric: He has a normal mood and affect. His behavior is normal.    Results for orders placed or performed during the hospital encounter of 09/04/17 (from the past 24 hour(s))  Glucose, capillary     Status: Abnormal   Collection Time: 09/06/17  8:48 AM  Result Value Ref Range   Glucose-Capillary 154 (H) 65 - 99 mg/dL  Glucose, capillary     Status: Abnormal   Collection Time: 09/06/17 12:51 PM  Result Value Ref Range   Glucose-Capillary 159 (H) 65 - 99 mg/dL  Glucose, capillary     Status: Abnormal   Collection Time: 09/06/17  6:19 PM  Result Value Ref Range   Glucose-Capillary 189 (H) 65 - 99 mg/dL  Glucose, capillary     Status: Abnormal   Collection Time: 09/06/17  9:46 PM  Result Value Ref Range   Glucose-Capillary 175 (H) 65 - 99 mg/dL  Glucose, capillary     Status: Abnormal   Collection Time: 09/07/17 12:13 AM  Result Value Ref Range   Glucose-Capillary 163 (H) 65 - 99 mg/dL  Basic metabolic panel     Status: Abnormal   Collection Time: 09/07/17  1:26 AM   Result Value Ref Range   Sodium 135 135 - 145 mmol/L   Potassium 4.6 3.5 - 5.1 mmol/L   Chloride 101 101 - 111 mmol/L   CO2 26 22 - 32 mmol/L   Glucose, Bld 120 (H) 65 - 99 mg/dL   BUN 22 (H) 6 - 20 mg/dL   Creatinine, Ser 2.09 (H) 0.61 - 1.24 mg/dL   Calcium 7.9 (L) 8.9 - 10.3 mg/dL   GFR calc non Af Amer 34 (L) >60 mL/min   GFR calc Af Amer 40 (L) >60 mL/min   Anion gap 8 5 - 15  Magnesium     Status: Abnormal   Collection Time: 09/07/17  1:26 AM  Result Value Ref Range   Magnesium 2.5 (H) 1.7 - 2.4 mg/dL  CBC     Status: Abnormal   Collection Time: 09/07/17  1:26 AM  Result Value Ref Range   WBC 11.3 (H) 4.0 - 10.5 K/uL   RBC 3.37 (L) 4.22 - 5.81 MIL/uL   Hemoglobin 9.3 (L) 13.0 - 17.0 g/dL   HCT 29.2 (L) 39.0 - 52.0 %   MCV 86.6 78.0 - 100.0 fL   MCH 27.6 26.0 - 34.0 pg   MCHC 31.8 30.0 - 36.0 g/dL   RDW 13.9 11.5 - 15.5 %   Platelets 310 150 - 400 K/uL  Glucose, capillary     Status: Abnormal   Collection Time: 09/07/17  5:10 AM  Result Value Ref Range   Glucose-Capillary 129 (H) 65 - 99 mg/dL   Mr Lumbar Spine Wo Contrast  Result Date: 09/05/2017 CLINICAL DATA:  Recent fall. Weakness. Diabetic. DKA. Foot osteomyelitis. EXAM: MRI LUMBAR SPINE WITHOUT CONTRAST TECHNIQUE: Multiplanar, multisequence MR imaging of the lumbar spine was performed. No intravenous contrast was administered. COMPARISON:  None. FINDINGS: Segmentation:  Standard. Alignment:  Anatomic. Vertebrae:  No worrisome osseous lesion. Conus medullaris and cauda equina: Conus extends to the L1. level. Conus and cauda equina appear normal. Paraspinal and other soft tissues: Marked bladder distention, query bladder outlet obstruction or neurogenic dysfunction. Disc levels: L1-L2:  Normal. L2-L3:  Normal. L3-L4:  Normal. L4-L5: Central and leftward protrusion. Facet arthropathy. Mild stenosis. Definite impingement not established. L5-S1: Annular bulge. Facet arthropathy. No impingement. In the ventral  epidural space  opposite S1, there is a T1 and T2 hypointense structure, probable venous plexus although a small caudally extruded disc fragment not completely excluded. No signs of epidural hematoma. No significant cauda equina compression. IMPRESSION: No lumbar compression fracture or traumatic subluxation. Central and leftward protrusion at L4-5 with mild stenosis, but no definite impingement. Annular bulge L5-S1. Small ventral epidural T1 and T2 hypointense structure behind S1, could represent caudally extruded disc fragment, but no significant cauda equina compression. Marked bladder distention, query bladder outlet obstruction or neurogenic dysfunction. Electronically Signed   By: Staci Righter M.D.   On: 09/05/2017 11:37   Dg Chest Port 1 View  Result Date: 09/07/2017 CLINICAL DATA:  Increased shortness of breath.  History of asthma. EXAM: PORTABLE CHEST 1 VIEW COMPARISON:  Multiple exams, including 09/04/2017 FINDINGS: Atherosclerotic calcification of the aortic arch. Heart size within normal limits. The lungs appear clear. No pleural effusion. No appreciable bony abnormality. IMPRESSION: 1.  No active cardiopulmonary disease is radiographically apparent. 2.  Aortoiliac atherosclerotic vascular disease. Electronically Signed   By: Van Clines M.D.   On: 09/07/2017 08:28    Assessment/Plan: Diagnosis: left BKA Labs and images independently reviewed.  Records reviewed and summated above. Clean amputation daily with soap and water Monitor incision site for signs of infection or impending skin breakdown. Staples to remain in place for 3-4 weeks Stump shrinker, for edema control  Scar mobilization massaging to prevent soft tissue adherence Stump protector during therapies Prevent flexion contractures by implementing the following:   Encourage prone lying for 20-30 mins per day BID to avoid hip flexion Contractures if medically appropriate;  Avoid pillow under knees when patient is lying in bed in order  to prevent both knee and hip flexion contractures;  Avoid prolonged sitting Post surgical pain control with oral medication Phantom limb pain control with physical modalities including desensitization techniques (gentle self massage to the residual stump,hot packs if sensation intact, [place]) and mirror therapy, TENS. If ineffective, consider pharmacological treatment for neuropathic pain (e.g gabapentin, pregabalin, amytriptalyine, duloxetine).  When using wheelchair, patient should have knee on amputated side fully extended with board under the seat cushion. Avoid injury to contralateral side  1. Does the need for close, 24 hr/day medical supervision in concert with the patient's rehab needs make it unreasonable for this patient to be served in a less intensive setting? Yes  2. Co-Morbidities requiring supervision/potential complications: CAD (cont meds), diabetes mellitus (Monitor in accordance with exercise and adjust meds as necessary), HTN (monitor and provide prns in accordance with increased physical exertion and pain), post-op pain management (Biofeedback training with therapies to help reduce reliance on opiate pain medications, monitor pain control during therapies, and sedation at rest and titrate to maximum efficacy to ensure participation and gains in therapies), Acute on chronic anemia (transfuse if necessary to ensure appropriate perfusion for increased activity tolerance), Sepsis d/c IV Vanc/Zosyn when appropriate), leukocytosis (cont to monitor for signs and symptoms of infection, further workup if indicated) 3. Due to safety, skin/wound care, disease management, pain management and patient education, does the patient require 24 hr/day rehab nursing? Yes 4. Does the patient require coordinated care of a physician, rehab nurse, PT (1-2 hrs/day, 5 days/week) and OT (1-2 hrs/day, 5 days/week) to address physical and functional deficits in the context of the above medical diagnosis(es)?  Yes Addressing deficits in the following areas: balance, endurance, locomotion, strength, transferring, bathing, dressing, toileting and psychosocial support 5. Can the patient actively participate in an  intensive therapy program of at least 3 hrs of therapy per day at least 5 days per week? Yes 6. The potential for patient to make measurable gains while on inpatient rehab is excellent 7. Anticipated functional outcomes upon discharge from inpatient rehab are modified independent and supervision  with PT, modified independent and supervision with OT, n/a with SLP. 8. Estimated rehab length of stay to reach the above functional goals is: 7-12 days. 9. Anticipated D/C setting: Home 10. Anticipated post D/C treatments: HH therapy and Home excercise program 11. Overall Rehab/Functional Prognosis: good  RECOMMENDATIONS: This patient's condition is appropriate for continued rehabilitative care in the following setting: CIR Patient has agreed to participate in recommended program. Yes Note that insurance prior authorization may be required for reimbursement for recommended care.  Comment: Rehab Admissions Coordinator to follow up.   I have personally performed a face to face diagnostic evaluation, including, but not limited to relevant history and physical exam findings, of this patient and developed relevant assessment and plan.  Additionally, I have reviewed and concur with the physician assistant's documentation above.   Delice Lesch, MD, ABPMR Lavon Paganini Angiulli, PA-C 09/07/2017

## 2017-09-07 NOTE — Progress Notes (Signed)
PROGRESS NOTE        PATIENT DETAILS Name: Chase Clark Age: 52 y.o. Sex: male Date of Birth: [birth year] Admit Date: 09/04/2017 Admitting Physician Harvie Bridge, DO PNT:[id number], [name], MD  Brief Narrative:  Patient is a 52 y.o. male history of chronic left heel foot ulcer, diabetes CAD status post PCI in 2007-presented to the hospital with weakness, near syncope, discharge from his chronic left foot ulcer-he was found to be hypotensive, and thought to have sepsis from left heel ulceration and underlying osteomyelitis.  See below for further details  Subjective: Patient in bed, appears comfortable, denies any headache, no fever, no chest pain or pressure, no shortness of breath , no abdominal pain. No focal weakness.  Dull nonradiating low back pain.    Assessment/Plan:  Sepsis secondary to left diabetic foot/heel ulceration with underlying osteomyelitis: Placed on empiric Zosyn and vancomycin, sepsis physiology has resolved, cultures negative thus far, seen by orthopedics Dr. 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  Will check MRSA nares, reordered as previous order pending.  If no signs of infection and clean stump might stop antibiotics in the next few days.  Anemia: Appears to have anemia secondary to chronic disease-probably due to chronic left heel ulcer and CKD at baseline, probably worse due to acute illness.  Denies any melena or hematochezia.  Transfused a total of 2 units of PRBC on admission, hemoglobin now stable when accounted for him dilution.  Acute kidney injury on chronic kidney disease stage III: CKD 3 baseline creatinine around 1.4, worsening due to acute sepsis along with bladder outlet obstruction, placed Foley on 09/06/2017, placed on Flomax, his random ultrasound bladder showed a liter of urine.  Will challenge with Foley removal soon.  Bladder outlet obstruction.  See above.    Stay patient.  Placed on bowel  regimen.    Back pain: Occur after fall-also might be some component of back pain due to bladder outlet obstruction, MRI nonacute continue supportive care.  Case discussed with Dr. Arnoldo Morale neurosurgeon on 09/06/2017, no further work-up needed per neurosurgery.  Peripheral neuropathy: Probably related to diabetes-continue Neurontin.  CAD: No anginal symptoms, resumed aspirin.  Hypertension: All antihypertensives on hold as BP soft.  Blood pressure improving will follow and resume accordingly.  Hyperlipidemia: Continue statin.  Insulin-dependent DM: CBG is stable-continue with Lantus 15 units daily, and SSI.  Follow and adjust accordingly metformin remains on hold.  CBG (last 3)  Recent Labs    09/07/17 0510 09/07/17 0843 09/07/17 1142  GLUCAP 129* 109* 209*      DVT Prophylaxis: Prophylactic Heparin   Code Status: Full code  Family Communication: Spouse at bedside  Disposition Plan: Remain inpatient-will require several more days of hospitalization prior to discharge  Antimicrobial agents: Anti-infectives (From admission, onward)   Start     Dose/Rate Route Frequency Ordered Stop   09/05/17 1900  vancomycin (VANCOCIN) IVPB 750 mg/150 ml premix     750 mg 150 mL/hr over 60 Minutes Intravenous Every 12 hours 09/05/17 1057 09/08/17 0659   09/05/17 0700  vancomycin (VANCOCIN) IVPB 1000 mg/200 mL premix  Status:  Discontinued     1,000 mg 200 mL/hr over 60 Minutes Intravenous Every 12 hours 09/04/17 1828 09/05/17 1057   09/05/17 0300  piperacillin-tazobactam (ZOSYN) IVPB 3.375 g     3.375 g 12.5 mL/hr over  240 Minutes Intravenous Every 8 hours 09/04/17 1830     09/04/17 1830  vancomycin (VANCOCIN) IVPB 750 mg/150 ml premix     750 mg 150 mL/hr over 60 Minutes Intravenous STAT 09/04/17 1813 09/04/17 2204   09/04/17 1800  piperacillin-tazobactam (ZOSYN) IVPB 3.375 g     3.375 g 100 mL/hr over 30 Minutes Intravenous  Once 09/04/17 1746 09/04/17 1840   09/04/17 1800   vancomycin (VANCOCIN) IVPB 1000 mg/200 mL premix     1,000 mg 200 mL/hr over 60 Minutes Intravenous  Once 09/04/17 1746 09/04/17 2006      Procedures: None  CONSULTS:  pulmonary/intensive care  Time spent: 35  minutes-Greater than 50% of this time was spent in counseling, explanation of diagnosis, planning of further management, and coordination of care.  MEDICATIONS: Scheduled Meds: . aspirin EC  81 mg Oral Daily  . bisacodyl  10 mg Rectal Once  . brimonidine  1 drop Right Eye BID   And  . timolol  1 drop Right Eye BID  . docusate sodium  100 mg Oral BID  . famotidine  20 mg Oral BID  . gabapentin  600 mg Oral BID  . heparin injection (subcutaneous)  5,000 Units Subcutaneous Q8H  . insulin aspart  0-15 Units Subcutaneous Q4H  . insulin glargine  50 Units Subcutaneous QAC breakfast  . latanoprost  1 drop Both Eyes QHS  . lidocaine  1 patch Transdermal Q24H  . magnesium hydroxide  30 mL Oral BID  . pantoprazole  40 mg Oral Daily  . polyethylene glycol  17 g Oral BID  . prednisoLONE acetate  1 drop Left Eye QHS  . rosuvastatin  10 mg Oral Daily  . tamsulosin  0.4 mg Oral Daily   Continuous Infusions: . lactated ringers Stopped (09/05/17 0300)  . piperacillin-tazobactam (ZOSYN)  IV Stopped (09/07/17 9381)  . vancomycin Stopped (09/07/17 0658)   PRN Meds:.acetaminophen **OR** acetaminophen, acetaminophen, HYDROmorphone (DILAUDID) injection, [DISCONTINUED] ondansetron **OR** ondansetron (ZOFRAN) IV, oxyCODONE, traMADol   PHYSICAL EXAM: Vital signs: Vitals:   09/06/17 2308 09/07/17 0110 09/07/17 0510 09/07/17 0513  BP:  (!) 116/93 (!) 124/92   Pulse:  (!) 106 95   Resp:  (!) 34 14   Temp: 100 F (37.8 C)   100.1 F (37.8 C)  TempSrc: Oral   Oral  SpO2:  94% 98%   Weight:      Height:       Filed Weights   09/04/17 1742  Weight: 84.8 kg (187 lb)   Body mass index is 26.08 kg/m.   Exam  Awake Alert, Oriented X 3, No new F.N deficits, Normal  affect [place].AT,PERRAL Supple Neck,No JVD, No cervical lymphadenopathy appriciated.  Symmetrical Chest wall movement, Good air movement bilaterally, CTAB RRR,No Gallops, Rubs or new Murmurs, No Parasternal Heave +ve B.Sounds, Abd Soft, No tenderness, No organomegaly appriciated, No rebound - guarding or rigidity. No Cyanosis, Clubbing or edema, No new Rash or bruise L BKA and a bandage, Foley catheter in place  I have personally reviewed following labs and imaging studies  LABORATORY DATA: CBC: Recent Labs  Lab 09/04/17 1745 09/04/17 2358 09/05/17 0625 09/06/17 0327 09/07/17 0126  WBC 16.7* 18.1* 19.6* 10.7* 11.3*  NEUTROABS 14.9*  --   --   --   --   HGB 6.1* 8.5* 9.6* 8.5* 9.3*  HCT 18.7* 24.7* 28.1* 25.7* 29.2*  MCV 84.2 83.2 82.6 84.8 86.6  PLT 299 286 263 232 829    Basic Metabolic  Panel: Recent Labs  Lab 09/04/17 1745 09/04/17 2303 09/05/17 0625 09/06/17 0327 09/07/17 0126  NA 130*  --  129* 130* 135  K 3.7  --  3.5 3.0* 4.6  CL 90*  --  91* 98* 101  CO2 27  --  28 24 26   GLUCOSE 258*  --  113* 210* 120*  BUN 23*  --  22* 24* 22*  CREATININE 2.22*  --  1.68* 1.97* 2.09*  CALCIUM 8.3*  --  8.4* 7.2* 7.9*  MG  --  1.6*  --   --  2.5*  PHOS  --  2.0*  --   --   --     GFR: Estimated Creatinine Clearance: 43 mL/min (A) (by C-G formula based on SCr of 2.09 mg/dL (H)).  Liver Function Tests: Recent Labs  Lab 09/04/17 1745 09/05/17 0625  AST 18 23  ALT 8* 13*  ALKPHOS 92 107  BILITOT 0.7 1.8*  PROT 5.7* 5.8*  ALBUMIN 1.4* 1.5*   No results for input(s): LIPASE, AMYLASE in the last 168 hours. No results for input(s): AMMONIA in the last 168 hours.  Coagulation Profile: Recent Labs  Lab 09/04/17 2358  INR 1.32    Cardiac Enzymes: No results for input(s): CKTOTAL, CKMB, CKMBINDEX, TROPONINI in the last 168 hours.  BNP (last 3 results) No results for input(s): PROBNP in the last 8760 hours.  HbA1C: No results for input(s): HGBA1C in the last 72  hours.  CBG: Recent Labs  Lab 09/06/17 2146 09/07/17 0013 09/07/17 0510 09/07/17 0843 09/07/17 1142  GLUCAP 175* 163* 129* 109* 209*    Lipid Profile: No results for input(s): CHOL, HDL, LDLCALC, TRIG, CHOLHDL, LDLDIRECT in the last 72 hours.  Thyroid Function Tests: No results for input(s): TSH, T4TOTAL, FREET4, T3FREE, THYROIDAB in the last 72 hours.  Anemia Panel: Recent Labs    09/04/17 2303 09/04/17 2358  VITAMINB12 334  --   FERRITIN 441*  --   TIBC 126*  --   IRON 38*  --   RETICCTPCT  --  1.6    Urine analysis:    Component Value Date/Time   COLORURINE YELLOW 09/05/2017 0404   APPEARANCEUR HAZY (A) 09/05/2017 0404   LABSPEC 1.015 09/05/2017 0404   PHURINE 5.0 09/05/2017 0404   GLUCOSEU >=500 (A) 09/05/2017 0404   HGBUR MODERATE (A) 09/05/2017 0404   BILIRUBINUR NEGATIVE 09/05/2017 0404   KETONESUR 5 (A) 09/05/2017 0404   PROTEINUR 100 (A) 09/05/2017 0404   UROBILINOGEN 0.2 10/22/2006 0948   NITRITE NEGATIVE 09/05/2017 0404   LEUKOCYTESUR NEGATIVE 09/05/2017 0404    Sepsis Labs: Lactic Acid, Venous    Component Value Date/Time   LATICACIDVEN 1.1 09/05/2017 6144    MICROBIOLOGY: Recent Results (from the past 240 hour(s))  Blood Culture (routine x 2)     Status: None (Preliminary result)   Collection Time: 09/04/17  5:45 PM  Result Value Ref Range Status   Specimen Description BLOOD RIGHT ANTECUBITAL  Final   Special Requests   Final    BOTTLES DRAWN AEROBIC AND ANAEROBIC Blood Culture adequate volume   Culture   Final    NO GROWTH 2 DAYS Performed at [facility], [place] [address] [place], [place] [address]    Report Status PENDING  Incomplete  Blood Culture (routine x 2)     Status: None (Preliminary result)   Collection Time: 09/04/17  6:04 PM  Result Value Ref Range Status   Specimen Description BLOOD LEFT FOREARM  Final   Special Requests   Final    BOTTLES DRAWN AEROBIC AND ANAEROBIC Blood Culture results may not be optimal due  to an inadequate volume of blood received in culture bottles   Culture   Final    NO GROWTH 2 DAYS Performed at [facility], [place] [address], [place], [place] [address]    Report Status PENDING  Incomplete  Urine culture     Status: Abnormal   Collection Time: 09/05/17  4:04 AM  Result Value Ref Range Status   Specimen Description URINE, RANDOM  Final   Special Requests   Final    NONE Performed at [facility], [place] [address] [place], [place] [address]    Culture MULTIPLE SPECIES PRESENT, SUGGEST RECOLLECTION (A)  Final   Report Status 09/06/2017 FINAL  Final    RADIOLOGY STUDIES/RESULTS: Dg Sacrum/coccyx  Result Date: 09/04/2017 CLINICAL DATA:  Fall with sacral pain EXAM: SACRUM AND COCCYX - 2+ VIEW COMPARISON:  None. FINDINGS: There is no evidence of fracture or other focal bone lesions. IMPRESSION: Negative. Electronically Signed   By: Donavan Foil M.D.   On: 09/04/2017 19:59   Dg Abd 1 View  Result Date: 08/24/2017 CLINICAL DATA:  Nausea and vomiting EXAM: ABDOMEN - 1 VIEW COMPARISON:  None. FINDINGS: Nonspecific decreased bowel gas. Small amount of gas in the transverse colon. Small amount of stool in the rectum. IMPRESSION: Nonspecific diffuse decreased bowel gas with small amount of gas in the transverse colon. Electronically Signed   By: Donavan Foil M.D.   On: 08/24/2017 00:37   Mr Lumbar Spine Wo Contrast  Result Date: 09/05/2017 CLINICAL DATA:  Recent fall. Weakness. Diabetic. DKA. Foot osteomyelitis. EXAM: MRI LUMBAR SPINE WITHOUT CONTRAST TECHNIQUE: Multiplanar, multisequence MR imaging of the lumbar spine was performed. No intravenous contrast was administered. COMPARISON:  None. FINDINGS: Segmentation:  Standard. Alignment:  Anatomic. Vertebrae:  No worrisome osseous lesion. Conus medullaris and cauda equina: Conus extends to the L1. level. Conus and cauda equina appear normal. Paraspinal and other soft tissues: Marked bladder distention, query bladder  outlet obstruction or neurogenic dysfunction. Disc levels: L1-L2:  Normal. L2-L3:  Normal. L3-L4:  Normal. L4-L5: Central and leftward protrusion. Facet arthropathy. Mild stenosis. Definite impingement not established. L5-S1: Annular bulge. Facet arthropathy. No impingement. In the ventral epidural space opposite S1, there is a T1 and T2 hypointense structure, probable venous plexus although a small caudally extruded disc fragment not completely excluded. No signs of epidural hematoma. No significant cauda equina compression. IMPRESSION: No lumbar compression fracture or traumatic subluxation. Central and leftward protrusion at L4-5 with mild stenosis, but no definite impingement. Annular bulge L5-S1. Small ventral epidural T1 and T2 hypointense structure behind S1, could represent caudally extruded disc fragment, but no significant cauda equina compression. Marked bladder distention, query bladder outlet obstruction or neurogenic dysfunction. Electronically Signed   By: Staci Righter M.D.   On: 09/05/2017 11:37   Ct Foot Left Wo Contrast  Result Date: 08/23/2017 CLINICAL DATA:  Diabetic patient with an ulceration on the left heel. EXAM: CT OF THE LEFT FOOT WITHOUT CONTRAST TECHNIQUE: Multidetector CT imaging of the left foot was performed according to the standard protocol. Multiplanar CT image reconstructions were also generated. COMPARISON:  Plain films of the left foot earlier today and 07/10/2016. MRI left ankle 05/20/2016. FINDINGS: Bones/Joint/Cartilage The patient has a remote healed fracture of the posterior, superior calcaneus. Irregularity of cortical bone along the inferior margin of the calcaneus is noted  but no cortical destructive change or periosteal reaction is identified. There is some sclerosis of the calcaneus eccentric toward the medial side. No fracture or dislocation. Ligaments Suboptimally assessed by CT. Muscles and Tendons Intact. Soft tissues Large skin ulceration over the heel is  identified. No underlying fluid collection is present. No radiopaque foreign body or soft tissue gas. Subcutaneous edema about the ankle is noted. Atherosclerosis is noted. IMPRESSION: Skin ulceration on the heel without CT evidence of underlying osteomyelitis. Negative for abscess. Healed fracture of dorsal calcaneus. Atherosclerosis. Electronically Signed   By: Inge Rise M.D.   On: 08/23/2017 17:20   Mr Foot Left Wo Contrast  Result Date: 09/05/2017 CLINICAL DATA:  Chronic open wound along the left heel. Diffuse swelling in the foot extending into the toes. Diabetes. EXAM: MRI OF THE LEFT HINDFOOT WITHOUT CONTRAST TECHNIQUE: Multiplanar, multisequence MR imaging of the ankle was performed. No intravenous contrast was administered. COMPARISON:  09/04/2017 FINDINGS: TENDONS Peroneal: Mild tendinopathy of the peroneus brevis. Posteromedial: Unremarkable Anterior: Unremarkable Achilles: Moderate distal Achilles tendinopathy with expansion and increased signal in the tendon. Plantar Fascia: Prominent thickening of the medial and lateral band of the plantar fascia with poor visualization and attenuation of the connection to the plantar calcaneus. This does not represent a total rupture given the apparent continuity on image 17/6, but may represent partial tearing. There is a soft tissue deficiency in this region overlying the plantar calcaneus. LIGAMENTS Lateral: Unremarkable although there appear to be fragmented osteophytes/ossicles deep to the anterior talofibular ligament on images 16-21 of series 4. Medial: Unremarkable CARTILAGE Ankle Joint: Unremarkable Subtalar Joints/Sinus Tarsi: Unremarkable Bones: Abnormal edema compatible with active osteomyelitis involving the posterior calcaneus extending as far forward as the calcaneal neck. Other: Prominent loss of soft tissue thickness along the posterior and plantar portion of the posterior calcaneus compatible with notable ulceration down nearly to the  periosteum. Diffuse subcutaneous edema in the ankle tracking into the foot especially dorsally. Speckled low densities in the soft tissues superficial to the plantar fascia and extending distally in the foot, compatible with gas tracking in the soft tissues. This extends at least to the distal metatarsal level. IMPRESSION: 1. 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 There is abnormal gas tracking along the plantar fascia and into the forefoot, extending at least as far as the level of the distal metatarsals, probably reflecting fasciitis. I do not see a drainable soft tissue abscess. 2. The osteomyelitis as undermined the plantar fascia connection to the calcaneus, which is thought to be significantly partially detached although not totally ruptured. 3. Moderate Achilles tendinopathy distally. 4. Abnormal edema in the ankle and foot suggesting cellulitis. 5. Mild peroneus brevis tendinopathy. Electronically Signed   By: Van Clines M.D.   On: 09/05/2017 08:08   Dg Chest Port 1 View  Result Date: 09/07/2017 CLINICAL DATA:  Increased shortness of breath.  History of asthma. EXAM: PORTABLE CHEST 1 VIEW COMPARISON:  Multiple exams, including 09/04/2017 FINDINGS: Atherosclerotic calcification of the aortic arch. Heart size within normal limits. The lungs appear clear. No pleural effusion. No appreciable bony abnormality. IMPRESSION: 1.  No active cardiopulmonary disease is radiographically apparent. 2.  Aortoiliac atherosclerotic vascular disease. Electronically Signed   By: Van Clines M.D.   On: 09/07/2017 08:28   Dg Chest Port 1 View  Result Date: 09/04/2017 CLINICAL DATA:  Sepsis EXAM: PORTABLE CHEST 1 VIEW COMPARISON:  09/04/2015 CXR FINDINGS: The heart size and mediastinal contours  are within normal limits. Minimal aortic atherosclerosis is noted. Both lungs are clear. ACDF hardware across the C5-6 interspace. The  visualized skeletal structures are unremarkable. IMPRESSION: No active disease. Electronically Signed   By: Ashley Royalty M.D.   On: 09/04/2017 18:30   Dg Foot 2 Views Left  Result Date: 09/04/2017 CLINICAL DATA:  Sepsis EXAM: LEFT FOOT - 2 VIEW COMPARISON:  08/23/2017 radiographs and CT FINDINGS: Soft tissue wound along the plantar aspect of the heel. Plantar calcaneal cortical irregularity is stable subjacent to skin ulceration. There is persistent soft tissue swelling along the plantar aspect of the mid and hindfoot though less so since prior. Interval increase in soft tissue swelling is noted of the forefoot however since prior. Lucencies along the plantar aspect of the foot may represent soft tissue emphysema. Remote calcaneal fracture with healing redemonstrated. Degenerative joint space narrowing of DIP and PIP joints of the second through fifth digits, first through fifth MTP articulations and midfoot. IMPRESSION: 1. Redemonstration of large soft tissue defect along the plantar aspect of the calcaneus with cortical irregularity and osteopenia subjacent to the skin wound compatible with chronic changes of osteomyelitis possibly acute on chronic. No significant bony changes are identified. 2. Soft tissue swelling along the plantar aspect of the mid and hindfoot persists with subcutaneous soft tissue emphysema suggested with decreased swelling along the plantar and dorsal aspect of the foot but increased along the dorsum and plantar aspect of the forefoot. Electronically Signed   By: Ashley Royalty M.D.   On: 09/04/2017 18:37   Dg Foot 2 Views Left  Result Date: 08/23/2017 CLINICAL DATA:  Diabetic patient with a nonhealing wound on the left heel. The wound is chronic. EXAM: LEFT FOOT - 2 VIEW COMPARISON:  Plain films of the left foot 07/10/2016. FINDINGS: There is a large skin wound on the plantar surface of the heel. Previously seen fracture of the dorsal calcaneus has healed. There is sclerosis in the  calcaneus and irregularity of cortical bone deep to the patient's skin ulceration. No radiopaque foreign body. Atherosclerosis noted. 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 Healed calcaneal fracture. Electronically Signed   By: Inge Rise M.D.   On: 08/23/2017 14:35     LOS: 3 days   Signature  Lala Lund M.D on 09/07/2017 at 1:01 PM  Between 7am to 7pm - Pager - [phone] ( page via [email or web address], text pages only, please mention full 10 digit call back number).  After 7pm go to [email or web address] - password [facility]     09/07/2017, 1:01 PM

## 2017-09-07 NOTE — Care Management Note (Addendum)
Case Management Note  Patient Details  Name: Chase Clark MRN: [id number] Date of Birth: 09/07/1965  Subjective/Objective:       Sepsis, hx of chronic left heel foot ulcer, diabetes CAD/PCI in 2007. From home with wife, [name].            5/23  S/P LAKA   DIAMANTE RUBIN (Spouse)      [phone]       PCP; Dr. [name]  Action/Plan: CIR, pending authorization approval....NCM will continue to monitor disposition needs.  Expected Discharge Date:                  Expected Discharge Plan:  [place]  In-House Referral:     Discharge planning Services  CM Consult  Post Acute Care Choice:    Choice offered to:     DME Arranged:    DME Agency:     HH Arranged:    [place] Agency:     Status of Service:  In process, will continue to follow  If discussed at Long Length of Stay Meetings, dates discussed:    Additional Comments:  Sharin Mons, RN 09/07/2017, 3:56 PM

## 2017-09-07 NOTE — Progress Notes (Signed)
   PATIENT ID: Chase Clark   2 Days Post-Op Procedure(s) (LRB): AMPUTATION BELOW KNEE (Left)  Subjective: Doing well this am per patient and sister. Tells me he was "loopy" yesterday but remembers me seeing him and talking about his drain/dressing. Min pain today.   Objective:  Vitals:   09/07/17 0510 09/07/17 0513  BP: (!) 124/92   Pulse: 95   Resp: 14   Temp:  100.1 F (37.8 C)  SpO2: 98%      L LE dressing c/d/i Drain removed     Labs:  Recent Labs    09/04/17 1745 09/04/17 2358 09/05/17 0625 09/06/17 0327 09/07/17 0126  HGB 6.1* 8.5* 9.6* 8.5* 9.3*   Recent Labs    09/06/17 0327 09/07/17 0126  WBC 10.7* 11.3*  RBC 3.03* 3.37*  HCT 25.7* 29.2*  PLT 232 310   Recent Labs    09/06/17 0327 09/07/17 0126  NA 130* 135  K 3.0* 4.6  CL 98* 101  CO2 24 26  BUN 24* 22*  CREATININE 1.97* 2.09*  GLUCOSE 210* 120*  CALCIUM 7.2* 7.9*    Assessment and Plan: 2 days s/p L BKA Pain well conctrolled Up with PT, rec CIR [employer] consulted for prothesis/ patient has a cousin with a prosthesis company as well he may reach out to.  abx and treatment per medicine, we appreciate their help in care for this nice patient Will continue to follow until d/c to CIR, follow up with Dr. [name] in 2 weeks   VTE proph: per primary team

## 2017-09-07 NOTE — Progress Notes (Signed)
Orthopedic Tech Progress Note Patient Details:  Chase Clark [birth year] [id number]  Patient ID: [name], male   DOB: December 31, 1965, 52 y.o.   MRN: [id number]   [name] 09/07/2017, 2:12 PMCalled [employer] for Left leg prothesis.

## 2017-09-07 NOTE — Progress Notes (Signed)
Pt's sister came to visit and he told her he doesn't remember anything from yesterday. Pt states in his dreams he thought he was in the jungle and a leopard was chasing him and he was screaming and nobody would help him. Pt didn't remember he had a foley catheter. Since the beginning of my shift pt has been very sleepy but will answer questions and take his medication appropriately when woke up. Pt awake talking to this nurse and his sister appropriately requesting food because he didn't eat dinner because he wouldn't wake up long enough to eat. Will give pt a frozen TV dinner to eat. Pt laughing about the situation now with nurse and sister. Will continue to monitor pt. Ranelle Oyster, RN

## 2017-09-07 NOTE — Progress Notes (Signed)
Inpatient Rehabilitation Admissions Coordinator  I met with patient, daughter, son in law and pastor at bedside. We discussed goals and expectations of an inpt rehab admission. They prefer inpt rehab and are in agreement. [employer] [place] is closed today due to the holiday. I will begin insurance authorization in the morning. I have asked for updated PT notes to assist with the approval process. I will follow up tomorrow pending insurance approval, bed availability and medical readiness to admit.  Danne Baxter, RN, MSN Rehab Admissions Coordinator [phone] 09/07/2017 3:49 PM

## 2017-09-08 ENCOUNTER — Inpatient Hospital Stay (HOSPITAL_COMMUNITY): Payer: BLUE CROSS/BLUE SHIELD

## 2017-09-08 ENCOUNTER — Ambulatory Visit: Payer: 59 | Admitting: Internal Medicine

## 2017-09-08 LAB — BPAM RBC
Blood Product Expiration Date: 201906042359
Blood Product Expiration Date: 201906132359
Blood Product Expiration Date: 201906142359
Blood Product Expiration Date: 201906142359
ISSUE DATE / TIME: 201905241836
ISSUE DATE / TIME: 201905241836
Unit Type and Rh: 7300
Unit Type and Rh: 7300
Unit Type and Rh: 9500
Unit Type and Rh: 9500

## 2017-09-08 LAB — TYPE AND SCREEN
ABO/RH(D): B POS
Antibody Screen: NEGATIVE
Unit division: 0
Unit division: 0
Unit division: 0
Unit division: 0

## 2017-09-08 LAB — URINALYSIS, ROUTINE W REFLEX MICROSCOPIC
Bilirubin Urine: NEGATIVE
Glucose, UA: >=500 mg/dL — AB
Ketones, ur: NEGATIVE mg/dL
Leukocytes, UA: NEGATIVE
Nitrite: NEGATIVE
Protein, ur: 100 mg/dL — AB
Specific Gravity, Urine: 1.003 — ABNORMAL LOW (ref 1.005–1.030)
pH: 8 (ref 5.0–8.0)

## 2017-09-08 LAB — BASIC METABOLIC PANEL
Anion gap: 10 (ref 5–15)
BUN: 15 mg/dL (ref 6–20)
CO2: 26 mmol/L (ref 22–32)
Calcium: 8.1 mg/dL — ABNORMAL LOW (ref 8.9–10.3)
Chloride: 100 mmol/L — ABNORMAL LOW (ref 101–111)
Creatinine, Ser: 1.83 mg/dL — ABNORMAL HIGH (ref 0.61–1.24)
GFR calc Af Amer: 47 mL/min — ABNORMAL LOW (ref >60)
GFR calc non Af Amer: 40 mL/min — ABNORMAL LOW (ref >60)
Glucose, Bld: 154 mg/dL — ABNORMAL HIGH (ref 65–99)
Potassium: 4.2 mmol/L (ref 3.5–5.1)
Sodium: 136 mmol/L (ref 135–145)

## 2017-09-08 LAB — GLUCOSE, CAPILLARY
Glucose-Capillary: 138 mg/dL — ABNORMAL HIGH (ref 65–99)
Glucose-Capillary: 153 mg/dL — ABNORMAL HIGH (ref 65–99)
Glucose-Capillary: 190 mg/dL — ABNORMAL HIGH (ref 65–99)
Glucose-Capillary: 195 mg/dL — ABNORMAL HIGH (ref 65–99)
Glucose-Capillary: 299 mg/dL — ABNORMAL HIGH (ref 65–99)
Glucose-Capillary: 324 mg/dL — ABNORMAL HIGH (ref 65–99)

## 2017-09-08 LAB — CBC
HCT: 27.6 % — ABNORMAL LOW (ref 39.0–52.0)
Hemoglobin: 9 g/dL — ABNORMAL LOW (ref 13.0–17.0)
MCH: 27.9 pg (ref 26.0–34.0)
MCHC: 32.6 g/dL (ref 30.0–36.0)
MCV: 85.4 fL (ref 78.0–100.0)
Platelets: 295 10*3/uL (ref 150–400)
RBC: 3.23 MIL/uL — ABNORMAL LOW (ref 4.22–5.81)
RDW: 13.4 % (ref 11.5–15.5)
WBC: 9.3 10*3/uL (ref 4.0–10.5)

## 2017-09-08 LAB — FOLATE RBC
Folate, Hemolysate: 439 ng/mL
Folate, RBC: 1763 ng/mL (ref >498)
Hematocrit: 24.9 % — ABNORMAL LOW (ref 37.5–51.0)

## 2017-09-08 MED ORDER — INSULIN ASPART 100 UNIT/ML ~~LOC~~ SOLN
0.0000 [IU] | Freq: Three times a day (TID) | SUBCUTANEOUS | Status: DC
  Administered 2017-09-08: 3 [IU] via SUBCUTANEOUS
  Administered 2017-09-08: 8 [IU] via SUBCUTANEOUS
  Administered 2017-09-08: 3 [IU] via SUBCUTANEOUS
  Administered 2017-09-09 (×2): 5 [IU] via SUBCUTANEOUS

## 2017-09-08 MED ORDER — OXYCODONE HCL 5 MG PO TABS: 5.0000 mg | ORAL_TABLET | Freq: Four times a day (QID) | ORAL | Status: DC | PRN

## 2017-09-08 MED ORDER — AMLODIPINE BESYLATE 10 MG PO TABS
10.0000 mg | ORAL_TABLET | Freq: Every day | ORAL | Status: DC
  Administered 2017-09-08 – 2017-09-09 (×2): 10 mg via ORAL
  Filled 2017-09-08 (×2): qty 1

## 2017-09-08 MED ORDER — SODIUM CHLORIDE 0.9 % IV SOLN
INTRAVENOUS | Status: AC
  Administered 2017-09-08: 13:00:00 via INTRAVENOUS

## 2017-09-08 MED ORDER — DOXYCYCLINE HYCLATE 100 MG PO TABS
100.0000 mg | ORAL_TABLET | Freq: Two times a day (BID) | ORAL | Status: DC
  Administered 2017-09-08 – 2017-09-09 (×3): 100 mg via ORAL
  Filled 2017-09-08 (×3): qty 1

## 2017-09-08 MED ORDER — POLYETHYLENE GLYCOL 3350 17 G PO PACK
17.0000 g | PACK | Freq: Every day | ORAL | Status: DC
  Filled 2017-09-08: qty 1

## 2017-09-08 MED ORDER — ACETAMINOPHEN 325 MG PO TABS
650.0000 mg | ORAL_TABLET | Freq: Once | ORAL | Status: AC
  Administered 2017-09-08: 650 mg via ORAL
  Filled 2017-09-08: qty 2

## 2017-09-08 NOTE — Progress Notes (Signed)
CSW notes CIR awaiting insurance approval. CSW will complete SNF backup, but note [employer] SNF approval takes 2-3 days.   Percell Locus Rayyan LCSW [phone]

## 2017-09-08 NOTE — Progress Notes (Signed)
Patient is up in the chair

## 2017-09-08 NOTE — Progress Notes (Signed)
Physical Therapy Treatment Patient Details Name: Chase Clark MRN: [id number] DOB: 25-Jan-1966 Today's Date: 09/08/2017    History of Present Illness Pt is a 52 yo male admitted through ED on 09/04/17 with continued weakness. Pt has recently been discharged from [facility] following [place] and ICU stay before returning home for 1 daty and returning to hospital. Pt had a chronic L diabetic foot ulcer with chronic osteomyelitis which had causes sepsis. Pt decided to undergo amputation on 09/05/17. PMH significant for CAD, DM, GERD, HTN, HLD, anemia.    PT Comments    Pt able to complete all amp exercises without difficulty, minA for optimal positioning. Pt con't to demo bilat UE weakness limiting ambulation tolerance. Pt unable to tolerate sitting due to fractured coccyx but will sidelying can tolerate sitting in a recliner. Pt very motivated and is eager to get back to independency and using a prosthetic leg. Con't to recommend inpatient rehab upon d/c to achieve maximal functional recovery and safe mod I w/c level function for safet transition home. Acute PT to con't to follow.    Follow Up Recommendations  CIR     Equipment Recommendations  None recommended by PT(TBD at next venue)    Recommendations for Other Services Rehab consult;OT consult     Precautions / Restrictions Precautions Precautions: Fall Restrictions Weight Bearing Restrictions: No Other Position/Activity Restrictions: L LE NWB    Mobility  Bed Mobility Overal bed mobility: Needs Assistance Bed Mobility: Supine to Sit     Supine to sit: Min assist     General bed mobility comments: increased time, used bed rail, minA for trunk elevation  Transfers Overall transfer level: Needs assistance Equipment used: Rolling walker (2 wheeled) Transfers: Sit to/from [facility] Sit to Stand: Mod assist Stand pivot transfers: Min assist       General transfer comment: modA to power up and steady during  transition of hands from bed to walker. minA for walker management during std pvt to [facility]  Ambulation/Gait Ambulation/Gait assistance: Min assist;+2 safety/equipment Ambulation Distance (Feet): 10 Feet Assistive device: Rolling walker (2 wheeled) Gait Pattern/deviations: Step-to pattern;Decreased stride length Gait velocity: slow Gait velocity interpretation: <1.31 ft/sec, indicative of household ambulator General Gait Details: v/c's to clear R foot and not slide, onset of bilat UE fatigue,    Stairs             Wheelchair Mobility    Modified Rankin (Stroke Patients Only)       Balance Overall balance assessment: Needs assistance Sitting-balance support: Bilateral upper extremity supported;Feet supported Sitting balance-Leahy Scale: Fair     Standing balance support: Bilateral upper extremity supported Standing balance-Leahy Scale: Poor Standing balance comment: reliant on UE support in standing                            Cognition Arousal/Alertness: Awake/alert Behavior During Therapy: WFL for tasks assessed/performed Overall Cognitive Status: Within Functional Limits for tasks assessed                                        Exercises Amputee Exercises Quad Sets: AROM;Both;10 reps;Supine Gluteal Sets: AROM;Both;10 reps;Supine Hip Extension: AROM;Strengthening;Left;Sidelying Hip ABduction/ADduction: AROM;Left;10 reps;Sidelying Straight Leg Raises: AROM;Left;10 reps;Supine    General Comments General comments (skin integrity, edema, etc.): pt with skin breakdown on buttocks      Pertinent Vitals/Pain Pain  Assessment: No/denies pain Pain Location: reports palpable pain on L buttocks Pain Intervention(s): Monitored during session    Home Living                      Prior Function            PT Goals (current goals can now be found in the care plan section) Progress towards PT goals: Progressing toward goals     Frequency    Min 3X/week      PT Plan Current plan remains appropriate    Co-evaluation              AM-PAC PT "6 Clicks" Daily Activity  Outcome Measure  Difficulty turning over in bed (including adjusting bedclothes, sheets and blankets)?: None Difficulty moving from lying on back to sitting on the side of the bed? : A Little Difficulty sitting down on and standing up from a chair with arms (e.g., wheelchair, bedside commode, etc,.)?: Unable Help needed moving to and from a bed to chair (including a wheelchair)?: A Lot Help needed walking in hospital room?: A Lot Help needed climbing 3-5 steps with a railing? : Total 6 Click Score: 13    End of Session Equipment Utilized During Treatment: Gait belt Activity Tolerance: Patient tolerated treatment well;Patient limited by pain;Patient limited by fatigue Patient left: in chair;with call bell/phone within reach;with family/visitor present(pt in sidelying due to inability to tolerate sitting) Nurse Communication: Mobility status PT Visit Diagnosis: Difficulty in walking, not elsewhere classified (R26.2);Unsteadiness on feet (R26.81)     Time: 3846-6599 PT Time Calculation (min) (ACUTE ONLY): 57 min  Charges:  $Gait Training: 8-22 mins $Therapeutic Exercise: 23-37 mins $Therapeutic Activity: 8-22 mins                    G Codes:       Kittie Plater, PT, DPT Pager #: [phone] Office #: [phone]    [place] 09/08/2017, 9:56 AM

## 2017-09-08 NOTE — Progress Notes (Signed)
Inpatient Rehabilitation Admissions Coordinator   I await insurance decision for a possible admission to inpt rehab. Noted fever workup ongoing. I will follow up tomorrow.  Danne Baxter, RN, MSN Rehab Admissions Coordinator [phone] 09/08/2017 4:27 PM

## 2017-09-08 NOTE — Progress Notes (Signed)
Attempted to receive a urinalysis sample however I was unable to unsuccessfully get one. The patient stated he also doesn't have the urge to urinate at this time.

## 2017-09-08 NOTE — Progress Notes (Addendum)
PROGRESS NOTE        PATIENT DETAILS Name: Chase Clark Age: 52 y.o. Sex: male Date of Birth: Jan 11, 1966 Admit Date: 09/04/2017 Admitting Physician Harvie Bridge, DO AST:[id number], [name], MD  Brief Narrative:  Patient is a 52 y.o. male history of chronic left heel foot ulcer, diabetes CAD status post PCI in 2007-presented to the hospital with weakness, near syncope, discharge from his chronic left foot ulcer-he was found to be hypotensive, and thought to have sepsis from left heel ulceration and underlying osteomyelitis.  See below for further details  Subjective: Patient in bed, appears comfortable, denies any headache, no fever, no chest pain or pressure, no shortness of breath , no abdominal pain. No focal weakness.  Dull nonradiating low back pain.    Assessment/Plan:  Sepsis secondary to left diabetic foot/heel ulceration with underlying osteomyelitis: Placed on empiric Zosyn and vancomycin, sepsis physiology has resolved, cultures negative thus far, seen by orthopedics Dr. 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  Discussed with orthopedic surgeon Dr. [name].  All empiric IV antibiotics, will give him 4 more days of oral doxycycline and then stop all antibiotics.  He is due for receiving prosthetic leg attachment today, SNF versus CIR placement.  Awaiting insurance approval.  Discussed with CIR coordinator today personally.  Anemia: Appears to have anemia secondary to chronic disease-probably due to chronic left heel ulcer and CKD at baseline, probably worse due to acute illness.  Denies any melena or hematochezia.  Transfused a total of 2 units of PRBC on admission, hemoglobin now stable when accounted for him dilution.  Acute kidney injury on chronic kidney disease stage III: CKD 3 baseline creatinine around 1.4, worsening due to acute sepsis along with bladder outlet obstruction, placed Foley on 09/06/2017, placed on Flomax, his  random ultrasound bladder showed a liter of urine.  Continue Foley until his activity level increases, he has not gotten out of the bed since his surgery, has been counseled daily to sit up in the recliner, has promised to do so today.  If he is active and having bowel movements we can attempt taking out the Foley catheter on 09/09/2017.  Continue Flomax.  Bladder outlet obstruction.  See above.    Constipation.  Placed on bowel regimen with good effect.    Back pain: Occur after fall-also might be some component of back pain due to bladder outlet obstruction, MRI nonacute continue supportive care.  Case discussed with Dr. Arnoldo Morale neurosurgeon on 09/06/2017, no further work-up needed per neurosurgery.  Peripheral neuropathy: Probably related to diabetes-continue Neurontin.  CAD: No anginal symptoms, resumed aspirin.  Hypertension: Blood pressure not improved we will add Norvasc.  Hyperlipidemia: Continue statin.  Insulin-dependent DM: CBG is stable-continue with Lantus 15 units daily, and SSI.  Follow and adjust accordingly metformin remains on hold.  CBG (last 3)  Recent Labs    09/08/17 0004 09/08/17 0455 09/08/17 0745  GLUCAP 153* 138* 190*      Addendum.  At 12:30 PM patient had a temp of 100.2.  Check UA, chest x-ray and 2 sets of blood cultures.  Give Tylenol and continue to monitor closely. Most likely atelectasis from staying in bed 24x7, encouraged to use IS which was bedside but not used.     DVT Prophylaxis: Prophylactic Heparin   Code Status: Full code  Family Communication: Spouse  at bedside  Disposition Plan: Remain inpatient-will require several more days of hospitalization prior to discharge  Antimicrobial agents: Anti-infectives (From admission, onward)   Start     Dose/Rate Route Frequency Ordered Stop   09/08/17 1000  doxycycline (VIBRA-TABS) tablet 100 mg     100 mg Oral Every 12 hours 09/08/17 0841 09/12/17 0959   09/05/17 1900  vancomycin  (VANCOCIN) IVPB 750 mg/150 ml premix     750 mg 150 mL/hr over 60 Minutes Intravenous Every 12 hours 09/05/17 1057 09/07/17 2121   09/05/17 0700  vancomycin (VANCOCIN) IVPB 1000 mg/200 mL premix  Status:  Discontinued     1,000 mg 200 mL/hr over 60 Minutes Intravenous Every 12 hours 09/04/17 1828 09/05/17 1057   09/05/17 0300  piperacillin-tazobactam (ZOSYN) IVPB 3.375 g  Status:  Discontinued     3.375 g 12.5 mL/hr over 240 Minutes Intravenous Every 8 hours 09/04/17 1830 09/08/17 0841   09/04/17 1830  vancomycin (VANCOCIN) IVPB 750 mg/150 ml premix     750 mg 150 mL/hr over 60 Minutes Intravenous STAT 09/04/17 1813 09/04/17 2204   09/04/17 1800  piperacillin-tazobactam (ZOSYN) IVPB 3.375 g     3.375 g 100 mL/hr over 30 Minutes Intravenous  Once 09/04/17 1746 09/04/17 1840   09/04/17 1800  vancomycin (VANCOCIN) IVPB 1000 mg/200 mL premix     1,000 mg 200 mL/hr over 60 Minutes Intravenous  Once 09/04/17 1746 09/04/17 2006      Procedures: None  CONSULTS:  pulmonary/intensive care  Time spent: 35  minutes-Greater than 50% of this time was spent in counseling, explanation of diagnosis, planning of further management, and coordination of care.  MEDICATIONS: Scheduled Meds: . aspirin EC  81 mg Oral Daily  . bisacodyl  10 mg Rectal Once  . brimonidine  1 drop Right Eye BID   And  . timolol  1 drop Right Eye BID  . docusate sodium  100 mg Oral BID  . doxycycline  100 mg Oral Q12H  . famotidine  20 mg Oral BID  . gabapentin  600 mg Oral BID  . heparin injection (subcutaneous)  5,000 Units Subcutaneous Q8H  . insulin aspart  0-15 Units Subcutaneous TID WC  . insulin glargine  50 Units Subcutaneous QAC breakfast  . latanoprost  1 drop Both Eyes QHS  . lidocaine  1 patch Transdermal Q24H  . pantoprazole  40 mg Oral Daily  . polyethylene glycol  17 g Oral BID  . prednisoLONE acetate  1 drop Left Eye QHS  . rosuvastatin  10 mg Oral Daily  . tamsulosin  0.4 mg Oral Daily    Continuous Infusions: . sodium chloride    . lactated ringers Stopped (09/05/17 0300)   PRN Meds:.acetaminophen **OR** acetaminophen, acetaminophen, HYDROmorphone (DILAUDID) injection, [DISCONTINUED] ondansetron **OR** ondansetron (ZOFRAN) IV, oxyCODONE, traMADol   PHYSICAL EXAM: Vital signs: Vitals:   09/07/17 2012 09/08/17 0005 09/08/17 0457 09/08/17 0846  BP: 128/87   (!) 141/110  Pulse: 94   (!) 102  Resp: 10   (!) 21  Temp: 99.7 F (37.6 C) 98.3 F (36.8 C) 99.6 F (37.6 C) 98.9 F (37.2 C)  TempSrc: Oral Oral Oral Oral  SpO2: 98%   99%  Weight:      Height:       Filed Weights   09/04/17 1742  Weight: 84.8 kg (187 lb)   Body mass index is 26.08 kg/m.   Exam  Awake Alert, Oriented X 3, No new F.N deficits, Normal affect  [place].AT,PERRAL Supple Neck,No JVD, No cervical lymphadenopathy appriciated.  Symmetrical Chest wall movement, Good air movement bilaterally, CTAB RRR,No Gallops, Rubs or new Murmurs, No Parasternal Heave +ve B.Sounds, Abd Soft, No tenderness, No organomegaly appriciated, No rebound - guarding or rigidity. No Cyanosis, Clubbing or edema, No new Rash or bruise L BKA and a bandage, Foley catheter in place  I have personally reviewed following labs and imaging studies  LABORATORY DATA: CBC: Recent Labs  Lab 09/04/17 1745 09/04/17 2358 09/05/17 0625 09/06/17 0327 09/07/17 0126 09/08/17 0453  WBC 16.7* 18.1* 19.6* 10.7* 11.3* 9.3  NEUTROABS 14.9*  --   --   --   --   --   HGB 6.1* 8.5* 9.6* 8.5* 9.3* 9.0*  HCT 18.7* 24.7* 28.1* 25.7* 29.2* 27.6*  MCV 84.2 83.2 82.6 84.8 86.6 85.4  PLT 299 286 263 232 310 161    Basic Metabolic Panel: Recent Labs  Lab 09/04/17 1745 09/04/17 2303 09/05/17 0625 09/06/17 0327 09/07/17 0126 09/08/17 0453  NA 130*  --  129* 130* 135 136  K 3.7  --  3.5 3.0* 4.6 4.2  CL 90*  --  91* 98* 101 100*  CO2 27  --  28 24 26 26   GLUCOSE 258*  --  113* 210* 120* 154*  BUN 23*  --  22* 24* 22* 15   CREATININE 2.22*  --  1.68* 1.97* 2.09* 1.83*  CALCIUM 8.3*  --  8.4* 7.2* 7.9* 8.1*  MG  --  1.6*  --   --  2.5*  --   PHOS  --  2.0*  --   --   --   --     GFR: Estimated Creatinine Clearance: 49.1 mL/min (A) (by C-G formula based on SCr of 1.83 mg/dL (H)).  Liver Function Tests: Recent Labs  Lab 09/04/17 1745 09/05/17 0625  AST 18 23  ALT 8* 13*  ALKPHOS 92 107  BILITOT 0.7 1.8*  PROT 5.7* 5.8*  ALBUMIN 1.4* 1.5*   No results for input(s): LIPASE, AMYLASE in the last 168 hours. No results for input(s): AMMONIA in the last 168 hours.  Coagulation Profile: Recent Labs  Lab 09/04/17 2358  INR 1.32    Cardiac Enzymes: No results for input(s): CKTOTAL, CKMB, CKMBINDEX, TROPONINI in the last 168 hours.  BNP (last 3 results) No results for input(s): PROBNP in the last 8760 hours.  HbA1C: No results for input(s): HGBA1C in the last 72 hours.  CBG: Recent Labs  Lab 09/07/17 1730 09/07/17 2001 09/08/17 0004 09/08/17 0455 09/08/17 0745  GLUCAP 329* 264* 153* 138* 190*    Lipid Profile: No results for input(s): CHOL, HDL, LDLCALC, TRIG, CHOLHDL, LDLDIRECT in the last 72 hours.  Thyroid Function Tests: No results for input(s): TSH, T4TOTAL, FREET4, T3FREE, THYROIDAB in the last 72 hours.  Anemia Panel: No results for input(s): VITAMINB12, FOLATE, FERRITIN, TIBC, IRON, RETICCTPCT in the last 72 hours.  Urine analysis:    Component Value Date/Time   COLORURINE YELLOW 09/05/2017 0404   APPEARANCEUR HAZY (A) 09/05/2017 0404   LABSPEC 1.015 09/05/2017 0404   PHURINE 5.0 09/05/2017 0404   GLUCOSEU >=500 (A) 09/05/2017 0404   HGBUR MODERATE (A) 09/05/2017 0404   BILIRUBINUR NEGATIVE 09/05/2017 0404   KETONESUR 5 (A) 09/05/2017 0404   PROTEINUR 100 (A) 09/05/2017 0404   UROBILINOGEN 0.2 10/22/2006 0948   NITRITE NEGATIVE 09/05/2017 0404   LEUKOCYTESUR NEGATIVE 09/05/2017 0404    Sepsis Labs: Lactic Acid, Venous    Component Value  Date/Time    LATICACIDVEN 1.1 09/05/2017 1025    MICROBIOLOGY: Recent Results (from the past 240 hour(s))  Blood Culture (routine x 2)     Status: None (Preliminary result)   Collection Time: 09/04/17  5:45 PM  Result Value Ref Range Status   Specimen Description BLOOD RIGHT ANTECUBITAL  Final   Special Requests   Final    BOTTLES DRAWN AEROBIC AND ANAEROBIC Blood Culture adequate volume   Culture   Final    NO GROWTH 3 DAYS Performed at [facility], [address] [place], [place] [address]    Report Status PENDING  Incomplete  Blood Culture (routine x 2)     Status: None (Preliminary result)   Collection Time: 09/04/17  6:04 PM  Result Value Ref Range Status   Specimen Description BLOOD LEFT FOREARM  Final   Special Requests   Final    BOTTLES DRAWN AEROBIC AND ANAEROBIC Blood Culture results may not be optimal due to an inadequate volume of blood received in culture bottles   Culture   Final    NO GROWTH 3 DAYS Performed at [facility], [place] [address] [place], [place] [address]    Report Status PENDING  Incomplete  Urine culture     Status: Abnormal   Collection Time: 09/05/17  4:04 AM  Result Value Ref Range Status   Specimen Description URINE, RANDOM  Final   Special Requests   Final    NONE Performed at [facility], [place] [address], [place], [place] [address]    Culture MULTIPLE SPECIES PRESENT, SUGGEST RECOLLECTION (A)  Final   Report Status 09/06/2017 FINAL  Final    RADIOLOGY STUDIES/RESULTS: Dg Sacrum/coccyx  Result Date: 09/04/2017 CLINICAL DATA:  Fall with sacral pain EXAM: SACRUM AND COCCYX - 2+ VIEW COMPARISON:  None. FINDINGS: There is no evidence of fracture or other focal bone lesions. IMPRESSION: Negative. Electronically Signed   By: Donavan Foil M.D.   On: 09/04/2017 19:59   Dg Abd 1 View  Result Date: 08/24/2017 CLINICAL DATA:  Nausea and vomiting EXAM: ABDOMEN - 1 VIEW COMPARISON:  None. FINDINGS: Nonspecific decreased bowel gas. Small  amount of gas in the transverse colon. Small amount of stool in the rectum. IMPRESSION: Nonspecific diffuse decreased bowel gas with small amount of gas in the transverse colon. Electronically Signed   By: Donavan Foil M.D.   On: 08/24/2017 00:37   Mr Lumbar Spine Wo Contrast  Result Date: 09/05/2017 CLINICAL DATA:  Recent fall. Weakness. Diabetic. DKA. Foot osteomyelitis. EXAM: MRI LUMBAR SPINE WITHOUT CONTRAST TECHNIQUE: Multiplanar, multisequence MR imaging of the lumbar spine was performed. No intravenous contrast was administered. COMPARISON:  None. FINDINGS: Segmentation:  Standard. Alignment:  Anatomic. Vertebrae:  No worrisome osseous lesion. Conus medullaris and cauda equina: Conus extends to the L1. level. Conus and cauda equina appear normal. Paraspinal and other soft tissues: Marked bladder distention, query bladder outlet obstruction or neurogenic dysfunction. Disc levels: L1-L2:  Normal. L2-L3:  Normal. L3-L4:  Normal. L4-L5: Central and leftward protrusion. Facet arthropathy. Mild stenosis. Definite impingement not established. L5-S1: Annular bulge. Facet arthropathy. No impingement. In the ventral epidural space opposite S1, there is a T1 and T2 hypointense structure, probable venous plexus although a small caudally extruded disc fragment not completely excluded. No signs of epidural hematoma. No significant cauda equina compression. IMPRESSION: No lumbar compression fracture or traumatic subluxation. Central and leftward protrusion at L4-5 with mild stenosis, but no definite impingement. Annular  bulge L5-S1. Small ventral epidural T1 and T2 hypointense structure behind S1, could represent caudally extruded disc fragment, but no significant cauda equina compression. Marked bladder distention, query bladder outlet obstruction or neurogenic dysfunction. Electronically Signed   By: Staci Righter M.D.   On: 09/05/2017 11:37   Ct Foot Left Wo Contrast  Result Date: 08/23/2017 CLINICAL DATA:   Diabetic patient with an ulceration on the left heel. EXAM: CT OF THE LEFT FOOT WITHOUT CONTRAST TECHNIQUE: Multidetector CT imaging of the left foot was performed according to the standard protocol. Multiplanar CT image reconstructions were also generated. COMPARISON:  Plain films of the left foot earlier today and 07/10/2016. MRI left ankle 05/20/2016. FINDINGS: Bones/Joint/Cartilage The patient has a remote healed fracture of the posterior, superior calcaneus. Irregularity of cortical bone along the inferior margin of the calcaneus is noted but no cortical destructive change or periosteal reaction is identified. There is some sclerosis of the calcaneus eccentric toward the medial side. No fracture or dislocation. Ligaments Suboptimally assessed by CT. Muscles and Tendons Intact. Soft tissues Large skin ulceration over the heel is identified. No underlying fluid collection is present. No radiopaque foreign body or soft tissue gas. Subcutaneous edema about the ankle is noted. Atherosclerosis is noted. IMPRESSION: Skin ulceration on the heel without CT evidence of underlying osteomyelitis. Negative for abscess. Healed fracture of dorsal calcaneus. Atherosclerosis. Electronically Signed   By: Inge Rise M.D.   On: 08/23/2017 17:20   Mr Foot Left Wo Contrast  Result Date: 09/05/2017 CLINICAL DATA:  Chronic open wound along the left heel. Diffuse swelling in the foot extending into the toes. Diabetes. EXAM: MRI OF THE LEFT HINDFOOT WITHOUT CONTRAST TECHNIQUE: Multiplanar, multisequence MR imaging of the ankle was performed. No intravenous contrast was administered. COMPARISON:  09/04/2017 FINDINGS: TENDONS Peroneal: Mild tendinopathy of the peroneus brevis. Posteromedial: Unremarkable Anterior: Unremarkable Achilles: Moderate distal Achilles tendinopathy with expansion and increased signal in the tendon. Plantar Fascia: Prominent thickening of the medial and lateral band of the plantar fascia with poor  visualization and attenuation of the connection to the plantar calcaneus. This does not represent a total rupture given the apparent continuity on image 17/6, but may represent partial tearing. There is a soft tissue deficiency in this region overlying the plantar calcaneus. LIGAMENTS Lateral: Unremarkable although there appear to be fragmented osteophytes/ossicles deep to the anterior talofibular ligament on images 16-21 of series 4. Medial: Unremarkable CARTILAGE Ankle Joint: Unremarkable Subtalar Joints/Sinus Tarsi: Unremarkable Bones: Abnormal edema compatible with active osteomyelitis involving the posterior calcaneus extending as far forward as the calcaneal neck. Other: Prominent loss of soft tissue thickness along the posterior and plantar portion of the posterior calcaneus compatible with notable ulceration down nearly to the periosteum. Diffuse subcutaneous edema in the ankle tracking into the foot especially dorsally. Speckled low densities in the soft tissues superficial to the plantar fascia and extending distally in the foot, compatible with gas tracking in the soft tissues. This extends at least to the distal metatarsal level. IMPRESSION: 1. 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 There is abnormal gas tracking along the plantar fascia and into the forefoot, extending at least as far as the level of the distal metatarsals, probably reflecting fasciitis. I do not see a drainable soft tissue abscess. 2. The osteomyelitis as undermined the plantar fascia connection to the calcaneus, which is thought to be significantly partially detached although not totally ruptured. 3. Moderate Achilles tendinopathy distally. 4. Abnormal  edema in the ankle and foot suggesting cellulitis. 5. Mild peroneus brevis tendinopathy. Electronically Signed   By: Van Clines M.D.   On: 09/05/2017 08:08   Dg Chest Port 1 View  Result Date:  09/07/2017 CLINICAL DATA:  Increased shortness of breath.  History of asthma. EXAM: PORTABLE CHEST 1 VIEW COMPARISON:  Multiple exams, including 09/04/2017 FINDINGS: Atherosclerotic calcification of the aortic arch. Heart size within normal limits. The lungs appear clear. No pleural effusion. No appreciable bony abnormality. IMPRESSION: 1.  No active cardiopulmonary disease is radiographically apparent. 2.  Aortoiliac atherosclerotic vascular disease. Electronically Signed   By: Van Clines M.D.   On: 09/07/2017 08:28   Dg Chest Port 1 View  Result Date: 09/04/2017 CLINICAL DATA:  Sepsis EXAM: PORTABLE CHEST 1 VIEW COMPARISON:  09/04/2015 CXR FINDINGS: The heart size and mediastinal contours are within normal limits. Minimal aortic atherosclerosis is noted. Both lungs are clear. ACDF hardware across the C5-6 interspace. The visualized skeletal structures are unremarkable. IMPRESSION: No active disease. Electronically Signed   By: Ashley Royalty M.D.   On: 09/04/2017 18:30   Dg Foot 2 Views Left  Result Date: 09/04/2017 CLINICAL DATA:  Sepsis EXAM: LEFT FOOT - 2 VIEW COMPARISON:  08/23/2017 radiographs and CT FINDINGS: Soft tissue wound along the plantar aspect of the heel. Plantar calcaneal cortical irregularity is stable subjacent to skin ulceration. There is persistent soft tissue swelling along the plantar aspect of the mid and hindfoot though less so since prior. Interval increase in soft tissue swelling is noted of the forefoot however since prior. Lucencies along the plantar aspect of the foot may represent soft tissue emphysema. Remote calcaneal fracture with healing redemonstrated. Degenerative joint space narrowing of DIP and PIP joints of the second through fifth digits, first through fifth MTP articulations and midfoot. IMPRESSION: 1. Redemonstration of large soft tissue defect along the plantar aspect of the calcaneus with cortical irregularity and osteopenia subjacent to the skin wound  compatible with chronic changes of osteomyelitis possibly acute on chronic. No significant bony changes are identified. 2. Soft tissue swelling along the plantar aspect of the mid and hindfoot persists with subcutaneous soft tissue emphysema suggested with decreased swelling along the plantar and dorsal aspect of the foot but increased along the dorsum and plantar aspect of the forefoot. Electronically Signed   By: Ashley Royalty M.D.   On: 09/04/2017 18:37   Dg Foot 2 Views Left  Result Date: 08/23/2017 CLINICAL DATA:  Diabetic patient with a nonhealing wound on the left heel. The wound is chronic. EXAM: LEFT FOOT - 2 VIEW COMPARISON:  Plain films of the left foot 07/10/2016. FINDINGS: There is a large skin wound on the plantar surface of the heel. Previously seen fracture of the dorsal calcaneus has healed. There is sclerosis in the calcaneus and irregularity of cortical bone deep to the patient's skin ulceration. No radiopaque foreign body. Atherosclerosis noted. 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 Healed calcaneal fracture. Electronically Signed   By: Inge Rise M.D.   On: 08/23/2017 14:35     LOS: 4 days   Signature  Lala Lund M.D on 09/08/2017 at 11:28 AM  Between 7am to 7pm - Pager - [phone] ( page via [email or web address], text pages only, please mention full 10 digit call back number).  After 7pm go to [email or web address] - password [facility]     09/08/2017, 11:28 AM

## 2017-09-08 NOTE — PMR Pre-admission (Signed)
PMR Admission Coordinator Pre-Admission Assessment  Patient: Chase Clark is an 52 y.o., male MRN: [id number] DOB: 1965-11-10 Height: 5\' 11"  (180.3 cm) Weight: 84.8 kg (187 lb)              Insurance Information HMO:     PPO: yes     PCP:      IPA:      80/20:      OTHER:  PRIMARY: [employer] [place]      Policy#: [id number]      Subscriber: wife CM Name: general number      Phone#: [phone]     Fax#: [phone] Pre-Cert#: [id number]    Approved for 7 days with updates due 09/15/17 to general fax  Employer: [employer] Benefits:  Phone #: [phone]     Name: 09/08/17 Eff. Date: 06/18/17     Deduct: $1250      Out of Pocket Max: $4800      Life Max: unlimited CIR: 80%      SNF: 80% 90 days Outpatient: 80%     Co-Pay: visits per medical neccesity Home Health: 80%      Co-Pay: 100 days DME: 80%     Co-Pay: 20% Providers: in network  SECONDARY: none      Medicaid Application Date:       Case Manager:  Disability Application Date:       Case Worker:   Emergency Facilities manager Information    Name Relation Home Work Mobile   Chase Clark [phone]  [phone]     Current Medical History  Patient Admitting Diagnosis: left BKA  History of Present Illness: HPI: Chase Clark is a 52 year old right-handed male with history of CAD with PCI 2007, diabetes mellitus, CKD stage III, hypertension, chronic left foot ulcer followed at the wound center in [facility] with frequent debridements. Presented 09/04/2017 with generalized weakness and near syncopal episode with fall.  Patient with recent admission of proximal 1 week ago for DKA at [facility] long hospital discharge to home returning the following day with weakness.  Noted complaints of back pain.  Chest x-ray negative.  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  MRI lumbar spine unremarkable.  Noted WBC 16,700, lactic acid 4.4.  Suspect sepsis related to left foot wound.  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 [name].  Hospital course pain management.  Subcutaneous heparin for DVT prophylaxis.  Acute on chronic anemia 9.0 and monitored.  Initially maintained on vancomycin and Zosyn for sepsis protocol with cultures thus far negative and changed to doxycycline x4 more days and stop.   CKD 3 baseline creatinine around 1.4, worsening due to acute sepsis along with bladder outlet obstruction, placed foley on 09/06/17, placed on flomax, his random ultrasound bladder showed a liter of urine. Plans to continue foley until his activity level increases.  Back pain occurring since fall. MRI non acute continue supportive care. Case discussed with Dr. Arnoldo Morale with neurosurgery with no further workup needed per his recommendation. Sacral pain with xray of sacrum and coccyx with no evidence of fracture or other focal bony lesions.   temp noted 5/28  of 100.2. Urine, chest xray and 2 sets of blood cultures. Gave tylenol and continue to monitor. Question atelectasis. Encourage use of IS.   Past Medical History  Past Medical History:  Diagnosis Date  . Arthritis   . Asthma    as child  . Coronary artery disease  DES DIAG1 11/20/09 [facility])  . Diabetes mellitus without complication ([place])   . Diverticulosis   . Edema, lower extremity   . Fatty liver   . GERD (gastroesophageal reflux disease)   . Gilbert's disease   . Glaucoma   . Heart disease 2011   Patient has stent for 80% blockage  . Hyperlipidemia   . Hypertension   . Neuropathy   . Seasonal allergies   . Shoulder pain   . Ulcer of foot ([place]) 08.06.2014   DIABETIC ULCERATIONS ASSOCIATED WITH IRRITATION LATERAL ANKLE LEFT GREATER THAN RIGHT WITH MILD CELLULITIS    Family History  family history includes Diabetes in his father and mother; Heart disease in his father; Hyperlipidemia in his father and mother; Hypertension in his father; Liver disease in his mother.  Prior Rehab/Hospitalizations:  Has the patient had major  surgery during 100 days prior to admission? No  Current Medications   Current Facility-Administered Medications:  .  acetaminophen (TYLENOL) tablet 325-650 mg, 325-650 mg, Oral, Q6H PRN, Ruthe Mannan, Danielle, PA-C .  acetaminophen (TYLENOL) tablet 650 mg, 650 mg, Oral, Q6H PRN, 650 mg at 09/09/17 0849 **OR** [DISCONTINUED] acetaminophen (TYLENOL) suppository 650 mg, 650 mg, Rectal, Q6H PRN, Laliberte, Danielle, PA-C .  amLODipine (NORVASC) tablet 10 mg, 10 mg, Oral, Daily, Thurnell Lose, MD, 10 mg at 09/09/17 0857 .  aspirin EC tablet 81 mg, 81 mg, Oral, Daily, Grier Mitts, PA-C, 81 mg at 09/09/17 0856 .  brimonidine (ALPHAGAN) 0.2 % ophthalmic solution 1 drop, 1 drop, Right Eye, BID, 1 drop at 09/09/17 0902 **AND** timolol (TIMOPTIC) 0.5 % ophthalmic solution 1 drop, 1 drop, Right Eye, BID, Laliberte, Danielle, PA-C, 1 drop at 09/09/17 0902 .  doxycycline (VIBRA-TABS) tablet 100 mg, 100 mg, Oral, Q12H, Thurnell Lose, MD, 100 mg at 09/09/17 0857 .  famotidine (PEPCID) tablet 20 mg, 20 mg, Oral, BID, Laliberte, Danielle, PA-C, 20 mg at 09/09/17 0859 .  gabapentin (NEURONTIN) capsule 600 mg, 600 mg, Oral, BID, Laliberte, Danielle, PA-C, 600 mg at 09/09/17 0859 .  heparin injection 5,000 Units, 5,000 Units, Subcutaneous, Q8H, Laliberte, Danielle, PA-C, 5,000 Units at 09/09/17 1253 .  HYDROmorphone (DILAUDID) injection 0.5-1 mg, 0.5-1 mg, Intravenous, Q4H PRN, Ruthe Mannan, Danielle, PA-C, 1 mg at 09/06/17 1727 .  insulin aspart (novoLOG) injection 0-15 Units, 0-15 Units, Subcutaneous, TID WC, Kirby-Graham, Karsten Fells, NP, 5 Units at 09/09/17 1249 .  insulin glargine (LANTUS) injection 50 Units, 50 Units, Subcutaneous, QAC breakfast, Grier Mitts, PA-C, 50 Units at 09/09/17 0853 .  lactated ringers infusion, , Intravenous, Continuous, Grier Mitts, PA-C, Stopped at 09/05/17 0300 .  latanoprost (XALATAN) 0.005 % ophthalmic solution 1 drop, 1 drop, Both Eyes, QHS, Laliberte,  Danielle, PA-C .  lidocaine (LIDODERM) 5 % 1 patch, 1 patch, Transdermal, Q24H, Thurnell Lose, MD, 1 patch at 09/08/17 2338 .  [DISCONTINUED] ondansetron (ZOFRAN) tablet 4 mg, 4 mg, Oral, Q6H PRN **OR** ondansetron (ZOFRAN) injection 4 mg, 4 mg, Intravenous, Q6H PRN, Hugelmeyer, Alexis, DO .  oxyCODONE (Oxy IR/ROXICODONE) immediate release tablet 5 mg, 5 mg, Oral, Q6H PRN, Thurnell Lose, MD .  pantoprazole (PROTONIX) EC tablet 40 mg, 40 mg, Oral, Daily, Laliberte, Danielle, PA-C, 40 mg at 09/09/17 0900 .  polyethylene glycol (MIRALAX / GLYCOLAX) packet 17 g, 17 g, Oral, Daily, Candiss Norse, Prashant K, MD .  prednisoLONE acetate (PRED FORTE) 1 % ophthalmic suspension 1 drop, 1 drop, Left Eye, QHS, Hugelmeyer, Alexis, DO .  rosuvastatin (CRESTOR) tablet 10 mg, 10 mg, Oral, Daily,  Grier Mitts, PA-C, 10 mg at 09/09/17 0900 .  tamsulosin (FLOMAX) capsule 0.4 mg, 0.4 mg, Oral, Daily, Lala Lund K, MD, 0.4 mg at 09/09/17 0900 .  traMADol (ULTRAM) tablet 50 mg, 50 mg, Oral, Q6H PRN, Grier Mitts, PA-C, 50 mg at 09/05/17 2107  Patients Current Diet:  Diet Order           Diet - low sodium heart healthy        Diet Carb Modified        Diet regular Room service appropriate? 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  Independent  Indoor Mobility: Did the patient need assistance with walking from room to room (with or without device)? Independent  Stairs: Did the patient need assistance with internal or external stairs (with or without device)? Independent  Functional Cognition: Did the patient need help planning regular tasks such as shopping or remembering to take medications? Independent  Current Functional Level Cognition  Overall Cognitive Status: Within Functional Limits for tasks assessed Current Attention Level: Alternating Orientation Level: Oriented X4 General Comments: suspect partly due to pain and lack of sleep    Extremity Assessment (includes Sensation/Coordination)  Upper Extremity Assessment: Overall WFL for tasks assessed  Lower Extremity Assessment: Defer to PT evaluation LLE Deficits / Details: BKA    ADLs  Overall ADL's : Needs assistance/impaired Eating/Feeding: Modified independent, Sitting Grooming: Set up, Sitting Upper Body Bathing: Min guard, Sitting Lower Body Bathing: Moderate assistance, Sitting/lateral leans, Sit to/from stand Upper Body Dressing : Min guard, Sitting Lower Body Dressing: Moderate assistance, Sitting/lateral leans, Sit to/from stand Toilet Transfer: Minimal assistance, Stand-pivot, BSC, RW Toileting- Clothing Manipulation and Hygiene: Maximal assistance, Sit to/from stand Toileting - Clothing Manipulation Details (indicate cue type and reason): assist for peri-care after BM while standing at RW  Functional mobility during ADLs: Minimal assistance, Rolling walker(stand pivot transfer) General ADL Comments: pt with increased pain levels this session and fatigued, assisted to [facility] for toileting and back to bed  end of session     Mobility  Overal bed mobility: Needs Assistance Bed Mobility: Supine to Sit Supine to sit: Min assist Sit to supine: Min assist Sit to sidelying: Min assist General bed mobility comments: OOB in recliner on entry     Transfers  Overall transfer level: Needs assistance Equipment used: Rolling walker (2 wheeled) Transfers: Sit to/from Stand, [employer] Transfers Sit to Stand: Min assist Stand pivot transfers: Min assist General transfer comment: min A for power up and steadying from RW, from toilet and from straight backed chair, vc for hand placement and sequencing, min guard for pivot transfer to recliner from straight backed chair  utilizing Rw    Ambulation / Gait / Stairs / Wheelchair Mobility  Ambulation/Gait Ambulation/Gait assistance: Museum/gallery curator (Feet): 5 Feet(2x 4ft into out of bathroom) Assistive device: Rolling walker (2 wheeled) Gait Pattern/deviations: Decreased stride length(hop to pattern) General Gait Details: min A for steadying with maneuvering into and out of bathroom, v/c for sequencing and pivoting within RW to square with toilet,  Gait velocity: slow Gait velocity interpretation: <1.31 ft/sec, indicative of household ambulator    Posture / Balance Balance Overall balance assessment: Needs assistance Sitting-balance support: Bilateral upper extremity supported, Feet supported Sitting balance-Leahy Scale: Fair Standing balance support: Bilateral upper extremity supported Standing balance-Leahy Scale: Poor Standing balance comment: reliant on UE support in standing    Special needs/care consideration BiPAP/CPAP  N/a CPM  N/a Continuous Drip IV  N/a Dialysis  N/a Life Vest  N/a Oxygen  N/a Special Bed  N/a Trach Size n/a Wound Vac (area) surgical site       Skin surgical incision with wound vac; right leg/shin with foam dressing Bowel mgmt: LBM 5/28; continent Bladder mgmt: indwelling catheter Diabetic mgmt yes  pta; Hgb A1c 15.3 on admit   Previous Home Environment Living Arrangements: Spouse/significant other  Lives With: Spouse Available Help at Discharge: Family, Available 24 hours/day(initially 24/7) Type of Home: House Home Layout: Two level, Able to live on main level with bedroom/bathroom Home Access: Stairs to enter Entrance Stairs-Rails: Right Entrance Stairs-Number of Steps: 2 in front and 4 into garage Bathroom Shower/Tub: Gaffer, Door [employer] Toilet: Standard Bathroom Accessibility: Yes How Accessible: Accessible via walker Home Care Services: No Additional Comments: has adjustable tempurpedic  Discharge Living Setting Plans for Discharge Living Setting: Patient's home, Lives with (comment)(wife) Type of Home at Discharge: House Discharge Home Layout: Two level, Able to live on main level with bedroom/bathroom Discharge Home Access: Stairs to enter Entrance Stairs-Rails: Right Entrance Stairs-Number of Steps: 2 in front and 4 in garage Discharge Bathroom Shower/Tub: Walk-in shower, Tub/shower unit, Door Discharge Bathroom Toilet: Standard Discharge Bathroom Accessibility: Yes How Accessible: Accessible via walker Does the patient have any problems obtaining your medications?: No  Social/Family/Support Systems Patient Roles: Spouse, Parent Contact Information: wife, [name] Anticipated Caregiver: wife, two daughters and son in law Anticipated Caregiver's Contact Information: see above Ability/Limitations of Caregiver: wife is a Marine scientist works days at a [employer] Availability: 24/7 Discharge Plan Discussed with Primary Caregiver: Yes Is Caregiver In Agreement with Plan?: Yes Does Caregiver/Family have Issues with Lodging/Transportation while Pt is in Rehab?: No  Goals/Additional Needs Patient/Family Goal for Rehab: Mod I to supervision wiht PT and OT Expected length of stay: ELOS 7 to 12 days Pt/Family Agrees to Admission and willing to participate:  Yes Program Orientation Provided & Reviewed with Pt/Caregiver Including Roles  & Responsibilities: Yes  Decrease burden of Care through IP rehab admission: n/a  Possible need for SNF placement upon discharge:not anticipated  Patient Condition: This patient's condition remains as documented in the consult dated 09/07/2017, in which the Rehabilitation Physician determined and documented that the patient's condition is appropriate for intensive rehabilitative care in an inpatient rehabilitation facility. Will admit to inpatient rehab today.  Preadmission Screen Completed By:  Cleatrice Burke, 09/09/2017 1:56 PM ______________________________________________________________________   Discussed status with Dr. Posey Pronto on 09/09/2017 at 1356 and received telephone approval for admission today.  Admission Coordinator:  Cleatrice Burke, time [id number] Date 09/09/2017

## 2017-09-09 ENCOUNTER — Inpatient Hospital Stay (HOSPITAL_COMMUNITY)
Admission: RE | Admit: 2017-09-09 | Discharge: 2017-09-17 | DRG: 560 | Disposition: A | Discharge status: Home / Self Care | Payer: BLUE CROSS/BLUE SHIELD | Source: Intra-hospital | Attending: Physical Medicine & Rehabilitation | Admitting: Physical Medicine & Rehabilitation

## 2017-09-09 ENCOUNTER — Inpatient Hospital Stay: Payer: BLUE CROSS/BLUE SHIELD | Admitting: Family Medicine

## 2017-09-09 DIAGNOSIS — R339 Retention of urine, unspecified: Secondary | ICD-10-CM | POA: Diagnosis not present

## 2017-09-09 DIAGNOSIS — E1142 Type 2 diabetes mellitus with diabetic polyneuropathy: Secondary | ICD-10-CM

## 2017-09-09 DIAGNOSIS — K219 Gastro-esophageal reflux disease without esophagitis: Secondary | ICD-10-CM | POA: Diagnosis not present

## 2017-09-09 DIAGNOSIS — E1122 Type 2 diabetes mellitus with diabetic chronic kidney disease: Secondary | ICD-10-CM | POA: Diagnosis present

## 2017-09-09 DIAGNOSIS — R0989 Other specified symptoms and signs involving the circulatory and respiratory systems: Secondary | ICD-10-CM | POA: Diagnosis not present

## 2017-09-09 DIAGNOSIS — E1169 Type 2 diabetes mellitus with other specified complication: Secondary | ICD-10-CM

## 2017-09-09 DIAGNOSIS — R7309 Other abnormal glucose: Secondary | ICD-10-CM | POA: Diagnosis not present

## 2017-09-09 DIAGNOSIS — S88112A Complete traumatic amputation at level between knee and ankle, left lower leg, initial encounter: Secondary | ICD-10-CM

## 2017-09-09 DIAGNOSIS — Z4781 Encounter for orthopedic aftercare following surgical amputation: Principal | ICD-10-CM

## 2017-09-09 DIAGNOSIS — Z955 Presence of coronary angioplasty implant and graft: Secondary | ICD-10-CM

## 2017-09-09 DIAGNOSIS — E11649 Type 2 diabetes mellitus with hypoglycemia without coma: Secondary | ICD-10-CM | POA: Diagnosis present

## 2017-09-09 DIAGNOSIS — D62 Acute posthemorrhagic anemia: Secondary | ICD-10-CM | POA: Diagnosis present

## 2017-09-09 DIAGNOSIS — N179 Acute kidney failure, unspecified: Secondary | ICD-10-CM

## 2017-09-09 DIAGNOSIS — E785 Hyperlipidemia, unspecified: Secondary | ICD-10-CM | POA: Diagnosis present

## 2017-09-09 DIAGNOSIS — K760 Fatty (change of) liver, not elsewhere classified: Secondary | ICD-10-CM | POA: Diagnosis present

## 2017-09-09 DIAGNOSIS — E162 Hypoglycemia, unspecified: Secondary | ICD-10-CM

## 2017-09-09 DIAGNOSIS — Z79899 Other long term (current) drug therapy: Secondary | ICD-10-CM | POA: Diagnosis not present

## 2017-09-09 DIAGNOSIS — I129 Hypertensive chronic kidney disease with stage 1 through stage 4 chronic kidney disease, or unspecified chronic kidney disease: Secondary | ICD-10-CM | POA: Diagnosis present

## 2017-09-09 DIAGNOSIS — Z89512 Acquired absence of left leg below knee: Secondary | ICD-10-CM | POA: Diagnosis not present

## 2017-09-09 DIAGNOSIS — I2510 Atherosclerotic heart disease of native coronary artery without angina pectoris: Secondary | ICD-10-CM | POA: Diagnosis not present

## 2017-09-09 DIAGNOSIS — N400 Enlarged prostate without lower urinary tract symptoms: Secondary | ICD-10-CM

## 2017-09-09 DIAGNOSIS — N183 Chronic kidney disease, stage 3 (moderate): Secondary | ICD-10-CM

## 2017-09-09 DIAGNOSIS — E1139 Type 2 diabetes mellitus with other diabetic ophthalmic complication: Secondary | ICD-10-CM

## 2017-09-09 DIAGNOSIS — Z7982 Long term (current) use of aspirin: Secondary | ICD-10-CM | POA: Diagnosis not present

## 2017-09-09 DIAGNOSIS — H409 Unspecified glaucoma: Secondary | ICD-10-CM | POA: Diagnosis present

## 2017-09-09 DIAGNOSIS — E1310 Other specified diabetes mellitus with ketoacidosis without coma: Secondary | ICD-10-CM | POA: Diagnosis not present

## 2017-09-09 DIAGNOSIS — Z794 Long term (current) use of insulin: Secondary | ICD-10-CM | POA: Diagnosis not present

## 2017-09-09 DIAGNOSIS — E1165 Type 2 diabetes mellitus with hyperglycemia: Secondary | ICD-10-CM

## 2017-09-09 DIAGNOSIS — Z87891 Personal history of nicotine dependence: Secondary | ICD-10-CM

## 2017-09-09 DIAGNOSIS — I10 Essential (primary) hypertension: Secondary | ICD-10-CM | POA: Diagnosis not present

## 2017-09-09 DIAGNOSIS — N184 Chronic kidney disease, stage 4 (severe): Secondary | ICD-10-CM

## 2017-09-09 DIAGNOSIS — N401 Enlarged prostate with lower urinary tract symptoms: Secondary | ICD-10-CM | POA: Diagnosis not present

## 2017-09-09 DIAGNOSIS — M869 Osteomyelitis, unspecified: Secondary | ICD-10-CM

## 2017-09-09 DIAGNOSIS — M792 Neuralgia and neuritis, unspecified: Secondary | ICD-10-CM

## 2017-09-09 DIAGNOSIS — Z833 Family history of diabetes mellitus: Secondary | ICD-10-CM | POA: Diagnosis not present

## 2017-09-09 DIAGNOSIS — N189 Chronic kidney disease, unspecified: Secondary | ICD-10-CM

## 2017-09-09 LAB — BASIC METABOLIC PANEL
Anion gap: 8 (ref 5–15)
BUN: 10 mg/dL (ref 6–20)
CO2: 27 mmol/L (ref 22–32)
Calcium: 8.4 mg/dL — ABNORMAL LOW (ref 8.9–10.3)
Chloride: 99 mmol/L — ABNORMAL LOW (ref 101–111)
Creatinine, Ser: 1.7 mg/dL — ABNORMAL HIGH (ref 0.61–1.24)
GFR calc Af Amer: 51 mL/min — ABNORMAL LOW (ref >60)
GFR calc non Af Amer: 44 mL/min — ABNORMAL LOW (ref >60)
Glucose, Bld: 249 mg/dL — ABNORMAL HIGH (ref 65–99)
Potassium: 4 mmol/L (ref 3.5–5.1)
Sodium: 134 mmol/L — ABNORMAL LOW (ref 135–145)

## 2017-09-09 LAB — CULTURE, BLOOD (ROUTINE X 2)
Culture: NO GROWTH
Culture: NO GROWTH
Special Requests: ADEQUATE

## 2017-09-09 LAB — CBC
HCT: 28 % — ABNORMAL LOW (ref 39.0–52.0)
Hemoglobin: 9.1 g/dL — ABNORMAL LOW (ref 13.0–17.0)
MCH: 27.7 pg (ref 26.0–34.0)
MCHC: 32.5 g/dL (ref 30.0–36.0)
MCV: 85.4 fL (ref 78.0–100.0)
Platelets: 290 10*3/uL (ref 150–400)
RBC: 3.28 MIL/uL — ABNORMAL LOW (ref 4.22–5.81)
RDW: 13.3 % (ref 11.5–15.5)
WBC: 9.5 10*3/uL (ref 4.0–10.5)

## 2017-09-09 LAB — GLUCOSE, CAPILLARY
Glucose-Capillary: 231 mg/dL — ABNORMAL HIGH (ref 65–99)
Glucose-Capillary: 244 mg/dL — ABNORMAL HIGH (ref 65–99)
Glucose-Capillary: 289 mg/dL — ABNORMAL HIGH (ref 65–99)
Glucose-Capillary: 169 mg/dL — ABNORMAL HIGH (ref 65–99)

## 2017-09-09 MED ORDER — LIDOCAINE 5 % EX PTCH
1.0000 | MEDICATED_PATCH | CUTANEOUS | Status: DC
  Administered 2017-09-09 – 2017-09-16 (×8): 1 via TRANSDERMAL
  Filled 2017-09-09 (×8): qty 1

## 2017-09-09 MED ORDER — INSULIN ASPART 100 UNIT/ML FLEXPEN: 0.0000 [IU] | PEN_INJECTOR | Freq: Three times a day (TID) | SUBCUTANEOUS | Status: DC

## 2017-09-09 MED ORDER — FAMOTIDINE 20 MG PO TABS
20.0000 mg | ORAL_TABLET | Freq: Two times a day (BID) | ORAL | Status: DC
  Administered 2017-09-09 – 2017-09-17 (×16): 20 mg via ORAL
  Filled 2017-09-09 (×16): qty 1

## 2017-09-09 MED ORDER — HEPARIN SODIUM (PORCINE) 5000 UNIT/ML IJ SOLN: 5000.0000 [IU] | Freq: Three times a day (TID) | INTRAMUSCULAR | Status: DC

## 2017-09-09 MED ORDER — METOPROLOL TARTRATE 100 MG PO TABS: 50.0000 mg | ORAL_TABLET | Freq: Two times a day (BID) | ORAL | Status: DC

## 2017-09-09 MED ORDER — AMLODIPINE BESYLATE 10 MG PO TABS: 10.0000 mg | ORAL_TABLET | Freq: Every day | ORAL | Status: DC

## 2017-09-09 MED ORDER — BRIMONIDINE TARTRATE 0.2 % OP SOLN
1.0000 [drp] | Freq: Two times a day (BID) | OPHTHALMIC | Status: DC
  Administered 2017-09-09 – 2017-09-17 (×14): 1 [drp] via OPHTHALMIC
  Filled 2017-09-09: qty 5

## 2017-09-09 MED ORDER — INSULIN ASPART 100 UNIT/ML ~~LOC~~ SOLN
0.0000 [IU] | Freq: Three times a day (TID) | SUBCUTANEOUS | Status: DC
  Administered 2017-09-09: 8 [IU] via SUBCUTANEOUS
  Administered 2017-09-10: 2 [IU] via SUBCUTANEOUS
  Administered 2017-09-10: 3 [IU] via SUBCUTANEOUS
  Administered 2017-09-10 – 2017-09-13 (×6): 2 [IU] via SUBCUTANEOUS
  Administered 2017-09-13: 3 [IU] via SUBCUTANEOUS
  Administered 2017-09-14: 2 [IU] via SUBCUTANEOUS
  Administered 2017-09-14 (×2): 3 [IU] via SUBCUTANEOUS
  Administered 2017-09-15: 5 [IU] via SUBCUTANEOUS
  Administered 2017-09-15: 3 [IU] via SUBCUTANEOUS
  Administered 2017-09-15: 5 [IU] via SUBCUTANEOUS
  Administered 2017-09-16: 3 [IU] via SUBCUTANEOUS
  Administered 2017-09-16: 5 [IU] via SUBCUTANEOUS
  Administered 2017-09-16 – 2017-09-17 (×2): 3 [IU] via SUBCUTANEOUS

## 2017-09-09 MED ORDER — POLYETHYLENE GLYCOL 3350 17 G PO PACK: 17.0000 g | PACK | Freq: Every day | ORAL | Status: DC

## 2017-09-09 MED ORDER — DOXYCYCLINE HYCLATE 100 MG PO TABS: 100.0000 mg | ORAL_TABLET | Freq: Two times a day (BID) | ORAL | Status: DC

## 2017-09-09 MED ORDER — ACETAMINOPHEN 325 MG PO TABS: 325.0000 mg | ORAL_TABLET | Freq: Four times a day (QID) | ORAL | Status: DC | PRN

## 2017-09-09 MED ORDER — TRAMADOL HCL 50 MG PO TABS
50.0000 mg | ORAL_TABLET | Freq: Four times a day (QID) | ORAL | Status: DC | PRN
  Administered 2017-09-10 – 2017-09-16 (×9): 50 mg via ORAL
  Filled 2017-09-09 (×9): qty 1

## 2017-09-09 MED ORDER — HEPARIN SODIUM (PORCINE) 5000 UNIT/ML IJ SOLN
5000.0000 [IU] | Freq: Three times a day (TID) | INTRAMUSCULAR | Status: DC
  Administered 2017-09-09 – 2017-09-17 (×23): 5000 [IU] via SUBCUTANEOUS
  Filled 2017-09-09 (×24): qty 1

## 2017-09-09 MED ORDER — LATANOPROST 0.005 % OP SOLN
1.0000 [drp] | Freq: Every day | OPHTHALMIC | Status: DC
  Administered 2017-09-09 – 2017-09-16 (×7): 1 [drp] via OPHTHALMIC
  Filled 2017-09-09: qty 2.5

## 2017-09-09 MED ORDER — GABAPENTIN 300 MG PO CAPS
600.0000 mg | ORAL_CAPSULE | Freq: Two times a day (BID) | ORAL | Status: DC
  Administered 2017-09-09 – 2017-09-17 (×16): 600 mg via ORAL
  Filled 2017-09-09 (×16): qty 2

## 2017-09-09 MED ORDER — ASPIRIN EC 81 MG PO TBEC
81.0000 mg | DELAYED_RELEASE_TABLET | Freq: Every day | ORAL | Status: DC
  Administered 2017-09-10 – 2017-09-17 (×8): 81 mg via ORAL
  Filled 2017-09-09 (×8): qty 1

## 2017-09-09 MED ORDER — AMLODIPINE BESYLATE 10 MG PO TABS
10.0000 mg | ORAL_TABLET | Freq: Every day | ORAL | Status: DC
  Administered 2017-09-10 – 2017-09-17 (×8): 10 mg via ORAL
  Filled 2017-09-09 (×8): qty 1

## 2017-09-09 MED ORDER — OXYCODONE HCL 5 MG PO TABS
5.0000 mg | ORAL_TABLET | Freq: Four times a day (QID) | ORAL | Status: DC | PRN
  Administered 2017-09-10 – 2017-09-17 (×17): 5 mg via ORAL
  Filled 2017-09-09 (×17): qty 1

## 2017-09-09 MED ORDER — INSULIN GLARGINE 100 UNIT/ML ~~LOC~~ SOLN
50.0000 [IU] | Freq: Every day | SUBCUTANEOUS | Status: DC
  Administered 2017-09-10 – 2017-09-12 (×3): 50 [IU] via SUBCUTANEOUS
  Filled 2017-09-09 (×4): qty 0.5

## 2017-09-09 MED ORDER — TIMOLOL MALEATE 0.5 % OP SOLN
1.0000 [drp] | Freq: Two times a day (BID) | OPHTHALMIC | Status: DC
  Administered 2017-09-09 – 2017-09-17 (×14): 1 [drp] via OPHTHALMIC
  Filled 2017-09-09: qty 5

## 2017-09-09 MED ORDER — POLYETHYLENE GLYCOL 3350 17 G PO PACK
17.0000 g | PACK | Freq: Every day | ORAL | Status: DC
  Administered 2017-09-15 – 2017-09-17 (×2): 17 g via ORAL
  Filled 2017-09-09 (×6): qty 1

## 2017-09-09 MED ORDER — TAMSULOSIN HCL 0.4 MG PO CAPS: 0.4000 mg | ORAL_CAPSULE | Freq: Every day | ORAL | Status: DC

## 2017-09-09 MED ORDER — PANTOPRAZOLE SODIUM 40 MG PO TBEC
40.0000 mg | DELAYED_RELEASE_TABLET | Freq: Every day | ORAL | Status: DC
  Administered 2017-09-10 – 2017-09-17 (×8): 40 mg via ORAL
  Filled 2017-09-09 (×8): qty 1

## 2017-09-09 MED ORDER — LIDOCAINE HCL URETHRAL/MUCOSAL 2 % EX GEL
1.0000 "application " | CUTANEOUS | Status: DC | PRN
  Filled 2017-09-09 (×10): qty 5

## 2017-09-09 MED ORDER — TAMSULOSIN HCL 0.4 MG PO CAPS
0.4000 mg | ORAL_CAPSULE | Freq: Every day | ORAL | Status: DC
  Administered 2017-09-10: 0.4 mg via ORAL
  Filled 2017-09-09: qty 1

## 2017-09-09 MED ORDER — DOXYCYCLINE HYCLATE 100 MG PO TABS
100.0000 mg | ORAL_TABLET | Freq: Two times a day (BID) | ORAL | Status: AC
  Administered 2017-09-09 – 2017-09-12 (×7): 100 mg via ORAL
  Filled 2017-09-09 (×7): qty 1

## 2017-09-09 MED ORDER — ROSUVASTATIN CALCIUM 10 MG PO TABS
10.0000 mg | ORAL_TABLET | Freq: Every day | ORAL | Status: DC
  Administered 2017-09-10 – 2017-09-17 (×8): 10 mg via ORAL
  Filled 2017-09-09 (×8): qty 1

## 2017-09-09 MED ORDER — PREDNISOLONE ACETATE 1 % OP SUSP
1.0000 [drp] | Freq: Every day | OPHTHALMIC | Status: DC
  Administered 2017-09-09 – 2017-09-16 (×7): 1 [drp] via OPHTHALMIC
  Filled 2017-09-09: qty 5

## 2017-09-09 MED ORDER — ACETAMINOPHEN 325 MG PO TABS
650.0000 mg | ORAL_TABLET | Freq: Four times a day (QID) | ORAL | Status: DC | PRN
  Administered 2017-09-09 – 2017-09-14 (×7): 650 mg via ORAL
  Filled 2017-09-09 (×8): qty 2

## 2017-09-09 NOTE — IPOC Note (Signed)
Overall Plan of Care [facility]) Patient Details Name: Chase Clark MRN: [id number] DOB: Aug 10, 1965  Admitting Diagnosis: Left BKA  Hospital Problems: Active Problems:   Amputation of left lower extremity below knee ([place])   Poorly controlled type 2 diabetes mellitus with peripheral neuropathy ([place])   Diabetic osteomyelitis ([place])     Functional Problem List: Nursing Bladder, Medication Management, Nutrition, Pain, Skin Integrity  PT Balance, Edema, Endurance, Pain, Safety, Sensory, Skin Integrity  OT Balance, Endurance, Motor, Pain, Safety, Skin Integrity  SLP    TR         Basic ADL's: OT Grooming, Bathing, Dressing, Toileting     Advanced  ADL's: OT Simple Meal Preparation     Transfers: PT Bed Mobility, Bed to Chair, Car, Furniture, Floor  OT Toilet, Metallurgist: PT Ambulation, Emergency planning/management officer, Stairs     Additional Impairments: OT None  SLP        TR      Anticipated Outcomes Item Anticipated Outcome  Self Feeding    Swallowing      Basic self-care  Media planner Transfers Supervision  Bowel/Bladder  to be continent x 2  Transfers  Mod I with LRAD   Locomotion  ambulatory Mod I with LRAD for short distances and Mod I in WC   Communication     Cognition     Pain  less than 2  Safety/Judgment  to remain fall free while in rehab   Therapy Plan: PT Intensity: Minimum of 1-2 x/day ,45 to 90 minutes PT Frequency: 5 out of 7 days PT Duration Estimated Length of Stay: 7-10  OT Intensity: Minimum of 1-2 x/day, 45 to 90 minutes OT Frequency: 5 out of 7 days OT Duration/Estimated Length of Stay: 7-10 days      Team Interventions: Nursing Interventions Patient/Family Education, Bladder Management, Disease Management/Prevention, Pain Management, Medication Management, Skin Care/Wound Management, Discharge Planning  PT interventions Ambulation/gait training, Balance/vestibular training, Community  reintegration, Discharge planning, Disease management/prevention, DME/adaptive equipment instruction, Functional electrical stimulation, Functional mobility training, Neuromuscular re-education, Patient/family education, Pain management, Psychosocial support, Skin care/wound management, Splinting/orthotics, Therapeutic Activities, UE/LE Strength taining/ROM, Therapeutic Exercise, UE/LE Coordination activities, Visual/perceptual remediation/compensation, Wheelchair propulsion/positioning  OT Interventions Training and development officer, Academic librarian, Discharge planning, Disease mangement/prevention, Engineer, drilling, Functional mobility training, Pain management, Patient/family education, Psychosocial support, Self Care/advanced ADL retraining, Skin care/wound managment, Therapeutic Activities, Therapeutic Exercise, UE/LE Strength taining/ROM, UE/LE Coordination activities, Wheelchair propulsion/positioning  SLP Interventions    TR Interventions    SW/CM Interventions Discharge Planning, Patient/Family Education, Psychosocial Support   Barriers to Discharge MD  Medical stability and Weight bearing restrictions  Nursing Wound Care    PT Inaccessible home environment, Decreased caregiver support, Home environment Child psychotherapist    OT      SLP      SW       Team Discharge Planning: Destination: PT-Home ,OT- Home , SLP-  Projected Follow-up: PT-Home health PT, OT-  Home health OT, SLP-  Projected Equipment Needs: PT-Wheelchair (measurements), Wheelchair cushion (measurements), Rolling walker with 5" wheels, OT- To be determined, SLP-  Equipment Details: PT- , OT-  Patient/family involved in discharge planning: PT- Patient,  OT-Patient, SLP-   MD ELOS: 6-9 days. Medical Rehab Prognosis:  Good Assessment: 52 year old right-handed male with history of CAD with PCI 2007, diabetes mellitus, CKD stage III, hypertension, chronic left foot ulcer followed at the wound center in  [facility] with frequent debridements.  Presented  09/04/2017 with generalized weakness and near syncopal episode with fall.  Patient with recent admission of proximal 1 week ago for DKA at [facility] long hospital discharge to home returning the following day with weakness.  Noted complaints of back pain.  Chest x-ray negative.  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  MRI lumbar spine unremarkable.  Noted WBC 16,700, lactic acid 4.4.  Suspect sepsis related to left foot wound.  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 [name].  Hospital course pain management.  Acute on chronic anemia monitored.  Initially maintained on vancomycin and Zosyn for sepsis protocol with cultures thus far negative and changed to doxycycline x4 more days and stop.  Patient with resulting functional deficits with mobility, transfers, self-care.  Will set goals for Supervision with PT/OT .  See Team Conference Notes for weekly updates to the plan of care

## 2017-09-09 NOTE — Progress Notes (Signed)
Pt arrived to unit via bed, pt is alert and oriented x 4, able to mk needs known.  Pain reported at 5 out of 10 to back, lungs clear, bs + in all 4 quads, LBM 05/28. Will continue to monitor.

## 2017-09-09 NOTE — Progress Notes (Signed)
Physical Therapy Treatment Patient Details Name: Chase Clark MRN: [id number] DOB: 03-15-1966 Today's Date: 09/09/2017    History of Present Illness Pt is a 52 yo male admitted through ED on 09/04/17 with continued weakness. Pt has recently been discharged from [facility] following [place] and ICU stay before returning home for 1 daty and returning to hospital. Pt had a chronic L diabetic foot ulcer with chronic osteomyelitis which had causes sepsis. Pt decided to undergo amputation on 09/05/17. PMH significant for CAD, DM, GERD, HTN, HLD, anemia.    PT Comments    Pt in recliner on entry and agreeable to therapy, pt reports doing his HEP in bed last night. Pt encouraged to  Complete HEP multiple times a day. Pt requested to go into bathroom for BM, required min A for transfers and ambulation of 5 feet with RW including pivoting to and from toilet and sink for washing hands. With standing at sink pt became diaphoretic and requested to sit. Pt sat in straight back chair while recliner maneuvered in place for stand pivot transfer. Pt with decrease in BP (see General Comments). Once in sitting pt feeling better, requiring increased time to find a comfortable position to decreased coccyx pain. PT will continue to follow acutely to progress towards mobility goals.    Follow Up Recommendations  CIR     Equipment Recommendations  None recommended by PT(TBD at next venue)    Recommendations for Other Services Rehab consult;OT consult     Precautions / Restrictions Precautions Precautions: Fall Restrictions Weight Bearing Restrictions: No Other Position/Activity Restrictions: L LE NWB    Mobility  Bed Mobility               General bed mobility comments: OOB in recliner on entry   Transfers Overall transfer level: Needs assistance Equipment used: Rolling walker (2 wheeled) Transfers: Sit to/from [facility] Sit to Stand: Min assist Stand pivot transfers: Min assist        General transfer comment: min A for power up and steadying from RW, from toilet and from straight backed chair, vc for hand placement and sequencing, min guard for pivot transfer to recliner from straight backed chair utilizing Rw  Ambulation/Gait Ambulation/Gait assistance: Min assist Ambulation Distance (Feet): 5 Feet(2x 75ft into out of bathroom) Assistive device: Rolling walker (2 wheeled) Gait Pattern/deviations: Decreased stride length(hop to pattern) Gait velocity: slow Gait velocity interpretation: <1.31 ft/sec, indicative of household ambulator General Gait Details: min A for steadying with maneuvering into and out of bathroom, v/c for sequencing and pivoting within RW to square with toilet,        Balance Overall balance assessment: Needs assistance Sitting-balance support: Bilateral upper extremity supported;Feet supported Sitting balance-Leahy Scale: Fair     Standing balance support: Bilateral upper extremity supported Standing balance-Leahy Scale: Poor Standing balance comment: reliant on UE support in standing                            Cognition Arousal/Alertness: Awake/alert Behavior During Therapy: WFL for tasks assessed/performed Overall Cognitive Status: Within Functional Limits for tasks assessed                                           General Comments General comments (skin integrity, edema, etc.): at rest BP 149/92, HR 101 bpm, SaO2 on RA 99%O2, with  ambulation max HR 110 bpm, pt became diaphretic with ambulation out of the bathroom while washing hands, sat in straight back chair for a minute before transferring into recliner, BP 139/81, HR 106 bpm, SaO2 on RA 98%O2      Pertinent Vitals/Pain Pain Assessment: Faces Faces Pain Scale: Hurts even more Pain Location: pain from coccyx fx, unable to get comfortable Pain Descriptors / Indicators: Aching;Grimacing;Guarding;Sore Pain Intervention(s): Limited activity within  patient's tolerance;Monitored during session;Repositioned           PT Goals (current goals can now be found in the care plan section) Acute Rehab PT Goals PT Goal Formulation: With patient/family Time For Goal Achievement: 09/20/17 Potential to Achieve Goals: Good Progress towards PT goals: Progressing toward goals    Frequency    Min 3X/week      PT Plan Current plan remains appropriate    Co-evaluation              AM-PAC PT "6 Clicks" Daily Activity  Outcome Measure  Difficulty turning over in bed (including adjusting bedclothes, sheets and blankets)?: None Difficulty moving from lying on back to sitting on the side of the bed? : A Little Difficulty sitting down on and standing up from a chair with arms (e.g., wheelchair, bedside commode, etc,.)?: Unable Help needed moving to and from a bed to chair (including a wheelchair)?: A Lot Help needed walking in hospital room?: A Lot Help needed climbing 3-5 steps with a railing? : Total 6 Click Score: 13    End of Session Equipment Utilized During Treatment: Gait belt Activity Tolerance: Patient tolerated treatment well;Patient limited by pain;Patient limited by fatigue Patient left: in chair;with call bell/phone within reach;with family/visitor present(pt in sidelying due to inability to tolerate sitting) Nurse Communication: Mobility status PT Visit Diagnosis: Difficulty in walking, not elsewhere classified (R26.2);Unsteadiness on feet (R26.81)     Time: 1031-5945 PT Time Calculation (min) (ACUTE ONLY): 37 min  Charges:  $Gait Training: 8-22 mins $Therapeutic Activity: 8-22 mins                    G Codes:       Elizabeth B. Migdalia Dk PT, DPT Acute Rehabilitation  [phone] Pager [phone]     [place] 09/09/2017, 11:19 AM

## 2017-09-09 NOTE — Progress Notes (Addendum)
Inpatient Diabetes Program Recommendations  AACE/ADA: New Consensus Statement on Inpatient Glycemic Control (2015)  Target Ranges:  Prepandial:   less than 140 mg/dL      Peak postprandial:   less than 180 mg/dL (1-2 hours)      Critically ill patients:  140 - 180 mg/dL   Lab Results  Component Value Date   GLUCAP 231 (H) 09/09/2017   HGBA1C 15.3 (H) 08/28/2017    Review of Glycemic Control Results for RIVAN, SIORDIA (MRN [id number]) as of 09/09/2017 09:17  Ref. Range 09/08/2017 17:45 09/08/2017 21:53 09/09/2017 08:28  Glucose-Capillary Latest Ref Range: 65 - 99 mg/dL 195 (H) 324 (H) 231 (H)   Diabetes history: Type 2 DM Outpatient Diabetes medications: Lantus 50 units QHS, Metformin 500 mg BID, Novolog 1-9 units TID Current orders for Inpatient glycemic control: Lantus 50 units QHS, Novolog 0-15 units TID  Inpatient Diabetes Program Recommendations:    Noted patient has regular diet ordered. A1C 15.3%, please change diet to carb modified.   Additionally, would recommend adding Novolog 0-5 units QHS and Novolog 4 units TID (assuming patient is consuming >50% of meal for post prandials that are exceeding >180 mg/dL). Will plan to see patient today.   Thanks, Bronson Curb, MSN, RNC-OB Diabetes Coordinator [phone] (8a-5p)  Addendum @ 1330: Spoke with patient regarding outpatient diabetes regimen. Verfied that patient was taking Lantus 50 untis QAM, Metformin 518m BID, and Novolog 1-30 units SSI. Patient states, "I would take my basal insulin every day and not skip doses, however, the SSI was difficult to take consistently because of what I do for work. I would find myself covering with additional Novolog in the evenings when I would eat one big meal. I understand that following this hospitalization that my occupation may change and that I have to do better."  Patient was not checking BS consistently and would be a great candidate for [employer] Educated patient on [employer] benefits, how to apply, use the meter, and discussed research study. He feels this would be great when discharged. Continuous glucose monitor kit (order # [id number] dose is 2, frequency 14 day)  Reviewed patient's current A1c of 15.3%, which is a marked increased from 9.9% in Dec. 2018. Explained what a A1c is and what it measures. Also reviewed goal A1c with patient, importance of good glucose control @ home, and blood sugar goals. Education provided on basic patho of DM, need for insulin, vascular changes that occurs and damage that has occurred from poor glycemic control.   Discussed the need for follow up with endocrinologist and provided patient with phone numbers. He states that he will plan to follow up. Encouraged patient to take the LEsbonmeter with him in the event he gets one placed or take his regular meter, in order for endo to make adjustments.  At this time, patient not interested in dietitian. Discussed the importance of following a carb modified diet and how this maybe a beneficial reminder. Set the goal of eating three meals per day and watching his intake of complex carbs.   At this time, patient has no further questions related to DM.   Thanks, LBronson Curb MSN, RNC-OB Diabetes Coordinator [phone](8a-5p)

## 2017-09-09 NOTE — Progress Notes (Signed)
Inpatient Rehabilitation Admissions Coordinator  I have insurance approval to admit pt to inpt rehab and bed available today. Patient and family made aware. I contacted Dr. Algis Liming for d/c order to CIR. I will make the arrangements.  Danne Baxter, RN, MSN Rehab Admissions Coordinator [phone] 09/09/2017 1:12 PM

## 2017-09-09 NOTE — Progress Notes (Signed)
CSW notes patient admitting to CIR.  CSW signing off.  Percell Locus Rayyan LCSW [phone]

## 2017-09-09 NOTE — Progress Notes (Signed)
Chase Clark to be D/C'd Rehab 4th floor [facility] per MD order.  Discussed with the patient and all questions fully answered.  VSS, Skin clean, dry and intact without evidence of skin break down, no evidence of skin tears noted. IV catheter discontinued intact. Site without signs and symptoms of complications. Dressing and pressure applied.  An After Visit Summary was printed and given to the patient. Patient received prescription.  D/c education completed with patient/family including follow up instructions, medication list, d/c activities limitations if indicated, with other d/c instructions as indicated by MD - patient able to verbalize understanding, all questions fully answered.   Patient instructed to return to ED, call 911, or call MD for any changes in condition.   Patient escorted via Bed, and D/C 4th floor [facility] via [place] staff.  Holley Raring 09/09/2017 2:20 PM

## 2017-09-09 NOTE — Discharge Summary (Addendum)
Physician Discharge Summary  Chase Clark:[id number] DOB: May 25, 1965  PCP: Chase Haven, MD  Admit date: 09/04/2017 Discharge date: 09/09/2017  Recommendations for Outpatient Follow-up:  1. MD at [facility]: Please follow daily labs (CBC & CMP) for the next 2-3 days to ensure patient's renal functions continue to improve.  Consider nephrology consultation if renal functions do not continue to improve or if they worsen. 2. Recommend renal ultrasound to rule out hydronephrosis, to be done at [facility]. 3. Dr. Malena Clark, Orthopedics in 2 weeks for postop follow-up. 4. Dr. Tommi Clark, PCP upon discharge from [name]. 5. Patient will need prescriptions at DC from [facility], for medications that were newly started during this hospitalization or changed during this hospitalization or at [facility] (amlodipine, metoprolol)  Home Health: N/A  Equipment/Devices: N/A    Discharge Condition: Improved and stable CODE STATUS: Full. Diet recommendation: Heart healthy & diabetic diet.  Discharge Diagnoses:  Active Problems:   Sepsis ([place])   Osteomyelitis of left foot (HCC)   Coronary artery disease involving native coronary artery of native heart without angina pectoris   Benign essential HTN   Diabetes mellitus type 2 in nonobese (HCC)   Post-operative pain   Acute blood loss anemia   Anemia of chronic disease   Leukocytosis   Brief Summary: 52 year old male with PMH of DM2/IDDM, HTN, HLD, CAD, GERD, chronic left diabetic foot ulcer with chronic osteomyelitis for which she followed at wound center at [facility] and underwent frequent debridements, recent admission for DKA, had been weak since that hospital discharge and presented to ED with generalized weakness, 2 episodes of near syncopal events, fall at home, low back pain, worsening left foot pain.  EMS apparently had trouble obtaining his blood pressure.  He was admitted for sepsis secondary to infected left diabetic foot wound and  osteomyelitis.  Assessment and plan:  1. Sepsis secondary to diabetic left foot/heel ulcer with underlying osteomyelitis: Treated per sepsis protocol and sepsis has resolved.  Empirically placed on IV Zosyn and vancomycin.  Blood cultures x2: Negative to date.  Urine culture suggestive of contamination.  IV antibiotics transitioned to oral doxycycline to complete on 09/11/2017.  Patient status post left BKA.  He is apparently supposed to receive prosthetic leg.  Rehab team has evaluated and able to accept him today for inpatient rehab. 2. Status post left below-knee amputation: Left diabetic foot ulcer was refractory to antibiotics and wound management.  Orthopedics was consulted and he underwent surgery on 09/05/2017.  Outpatient follow-up with Dr. Tamera Clark in 2 weeks. 3. Normocytic anemia: Patient likely has anemia of chronic disease.  Hemoglobin possibly in the 9 g range at baseline.  Presented with hemoglobin of 6.1.  Transfused 2 units of PRBCs.  Hemoglobin improved and stable in the 9 range for the last 3 days.  Anemia panel shows ferritin 441, RBC folate 1763 and B12: 334.  Follow CBCs periodically. 4. Acute on stage III chronic kidney disease: Baseline creatinine not definitely known.  Upon review of his labs, creatinine has been fluctuating and may be anywhere between 1.2- 1.6 range.  There have been times occasionally when creatinine was normal.  Creatinine peaked to 2.22 on 5/24.  Acute kidney injury likely multifactorial related to sepsis, bladder outlet obstruction, HCTZ and ARB.  Acute urinary retention treated with Foley catheter.  Hydrated with IV fluids.  Flomax added.  Creatinine has gradually improved to 1.7.  Recommend follow-up of daily labs and if not getting better or if worsen, consider nephrology  consultation and also recommend renal ultrasound.  Renal ultrasound on 04/29/2017 had shown no hydronephrosis, normal size echogenic kidneys of uncertain chronicity and normal bladder. 5. Acute  urinary retention/bladder outlet obstruction: Bladder ultrasound apparently showed a liter of urine in Foley catheter was placed on 09/06/2017.  Flomax was added.  Patient is more mobile now.  Foley catheter to be discontinued prior to discharge to [facility] today.  I discussed with rehab coordinator and they can check for voiding and postvoid residual at [facility] and if he develops recurrent urinary retention, may need to place back Foley catheter and consult urology. 6. Constipation: Having regular BMs.  Continue bowel regimen. 7. Low back pain: May be multifactorial related to bladder outlet obstruction, constipation, fall.  MRI of lumbar spine did not show acute findings.  Case was discussed with neurosurgeon on 5/26 and no further work-up was recommended. 8. Peripheral neuropathy: Continue Neurontin. 9. CAD: No chest pain reported.  Continue aspirin.  Metoprolol dose reduced from 100 mg to 50 mg twice daily.  This can be titrated up as tolerated to prior home dose. 10. Essential hypertension: HCTZ and ARB were discontinued due to acute kidney injury.  Amlodipine was newly started.  Metoprolol was resumed at discharge at half the prior dose.  Monitor closely and adjust medications as deemed necessary. 11. Hyperlipidemia: Continue statins. 12. Type II DM/IDDM: Remains on home dose of Lantus and SSI continued.  CBGs are widely fluctuating.  Monitor closely and may need to adjust insulins as needed.  Metformin on hold due to acute kidney injury. 13. Low-grade fever: Noted temperature of 100.2 F on 5/28 without overt features of infection.  Chest x-ray without acute disease.  Urine culture from 5/25 suggestive of contamination.  Urine microscopy from 5/28 not indicative of infection.  Intermittent low-grade fevers may be related to recent surgery.  Complete course of doxycycline. 14. Isolated hyperbilirubinemia: Unclear etiology.?  Related to sepsis.  Follow CMP at  [facility].   Consultations:  Orthopedics  Procedures:  Left BKA.  Foley catheter   Discharge Instructions  Discharge Instructions    (HEART FAILURE PATIENTS) Call MD:  Anytime you have any of the following symptoms: 1) 3 pound weight gain in 24 hours or 5 pounds in 1 week 2) shortness of breath, with or without a dry hacking cough 3) swelling in the hands, feet or stomach 4) if you have to sleep on extra pillows at night in order to breathe.   Complete by:  As directed    Call MD for:  difficulty breathing, headache or visual disturbances   Complete by:  As directed    Call MD for:  extreme fatigue   Complete by:  As directed    Call MD for:  persistant dizziness or light-headedness   Complete by:  As directed    Call MD for:  persistant nausea and vomiting   Complete by:  As directed    Call MD for:  redness, tenderness, or signs of infection (pain, swelling, redness, odor or green/yellow discharge around incision site)   Complete by:  As directed    Call MD for:  severe uncontrolled pain   Complete by:  As directed    Call MD for:  temperature >100.4   Complete by:  As directed    Diet - low sodium heart healthy   Complete by:  As directed    Diet Carb Modified   Complete by:  As directed    Increase activity slowly  Complete by:  As directed        Medication List    STOP taking these medications   furosemide 40 MG tablet Commonly known as:  LASIX   losartan-hydrochlorothiazide 100-25 MG tablet Commonly known as:  HYZAAR   metFORMIN 500 MG tablet Commonly known as:  GLUCOPHAGE   metoCLOPramide 5 MG tablet Commonly known as:  REGLAN     TAKE these medications   acetaminophen 325 MG tablet Commonly known as:  TYLENOL Take 1 tablet (325 mg total) by mouth every 6 (six) hours as needed for mild pain (pain score 1-3 or temp > 100.5).   amLODipine 10 MG tablet Commonly known as:  NORVASC Take 1 tablet (10 mg total) by mouth daily. Start taking on:   09/10/2017   aspirin EC 81 MG tablet Take 81 mg by mouth daily.   BLUE-EMU MAXIMUM STRENGTH EX Apply 1 application topically See admin instructions. Apply to the back daily as directed for pain   COMBIGAN 0.2-0.5 % ophthalmic solution Generic drug:  brimonidine-timolol Place 1 drop into the right eye every 12 (twelve) hours.   DEXCOM G6 RECEIVER Devi 1 each by Does not apply route as directed. Use to check blood glucose 5 times daily   DEXCOM G6 SENSOR Misc 1 each by Does not apply route as directed. Place 1 sensor subcutaneously once every 10 days.   DEXCOM G6 TRANSMITTER Misc 1 each by Does not apply route as directed. Use to check blood glucose 5 times daily. Refill every 3 months.   doxycycline 100 MG tablet Commonly known as:  VIBRA-TABS Take 1 tablet (100 mg total) by mouth every 12 (twelve) hours. Discontinue after 09/11/2017 doses.   DUREZOL 0.05 % Emul Generic drug:  Difluprednate Place 1 drop into the left eye at bedtime   esomeprazole 40 MG capsule Commonly known as:  NEXIUM Take 1 capsule (40 mg total) by mouth daily.   gabapentin 300 MG capsule Commonly known as:  NEURONTIN Take 2 capsules (600 mg total) by mouth 2 (two) times daily.   insulin aspart 100 UNIT/ML FlexPen Commonly known as:  NOVOLOG FLEXPEN Inject 0-15 Units into the skin 3 (three) times daily with meals. CBG < 70: implement hypoglycemia protocol CBG 70 - 120: 0 units CBG 121 - 150: 2 units CBG 151 - 200: 3 units CBG 201 - 250: 5 units CBG 251 - 300: 8 units CBG 301 - 350: 11 units CBG 351 - 400: 15 units CBG > 400: call MD. What changed:    how much to take  how to take this  when to take this  additional instructions   insulin glargine 100 UNIT/ML injection Commonly known as:  LANTUS Inject 0.5 mLs (50 Units total) into the skin daily. What changed:  when to take this   latanoprost 0.005 % ophthalmic solution Commonly known as:  XALATAN Place 1 drop into both eyes at  bedtime.   metoprolol tartrate 100 MG tablet Commonly known as:  LOPRESSOR Take 0.5 tablets (50 mg total) by mouth 2 (two) times daily. What changed:  how much to take   oxyCODONE 5 MG immediate release tablet Commonly known as:  ROXICODONE Take 1-2 tablets (5-10 mg total) by mouth every 4 (four) hours as needed. What changed:    medication strength  how much to take  reasons to take this   Pen Needles 32G X 4 MM Misc Inject 90 each 3 (three) times daily into the skin.   polyethylene glycol  packet Commonly known as:  MIRALAX / GLYCOLAX Take 17 g by mouth daily. Start taking on:  09/10/2017   rosuvastatin 10 MG tablet Commonly known as:  CRESTOR Take 10 mg by mouth daily. What changed:  Another medication with the same name was removed. Continue taking this medication, and follow the directions you see here.   tamsulosin 0.4 MG Caps capsule Commonly known as:  FLOMAX Take 1 capsule (0.4 mg total) by mouth daily. Start taking on:  09/10/2017      Follow-up Information    Chase Catholic, MD. Schedule an appointment as soon as possible for a visit in 2 week(s).   Contact information: [address] [place] [address] [phone]        Chase Haven, MD. Schedule an appointment as soon as possible for a visit.   Specialty:  Family Medicine Why:  Upon discharge from [facility]. Contact information: [place] [address] [phone]        MD at [facility] Follow up.   Why:  Monitor daily labs (CBC & BMP).  Recommend renal ultrasound to rule out hydronephrosis.  Consider nephrology consultation if renal functions do not continue to improve or if they worsen.         Allergies  Allergen Reactions  . Atorvastatin Other (See Comments)    Muscle aches       Procedures/Studies: Dg Sacrum/coccyx  Result Date: 09/04/2017 CLINICAL DATA:  Fall with sacral pain EXAM: SACRUM AND COCCYX - 2+ VIEW COMPARISON:  None. FINDINGS: There is no  evidence of fracture or other focal bone lesions. IMPRESSION: Negative. Electronically Signed   By: Donavan Foil M.D.   On: 09/04/2017 19:59   Dg Abd 1 View  Result Date: 08/24/2017 CLINICAL DATA:  Nausea and vomiting EXAM: ABDOMEN - 1 VIEW COMPARISON:  None. FINDINGS: Nonspecific decreased bowel gas. Small amount of gas in the transverse colon. Small amount of stool in the rectum. IMPRESSION: Nonspecific diffuse decreased bowel gas with small amount of gas in the transverse colon. Electronically Signed   By: Donavan Foil M.D.   On: 08/24/2017 00:37   Mr Lumbar Spine Wo Contrast  Result Date: 09/05/2017 CLINICAL DATA:  Recent fall. Weakness. Diabetic. DKA. Foot osteomyelitis. EXAM: MRI LUMBAR SPINE WITHOUT CONTRAST TECHNIQUE: Multiplanar, multisequence MR imaging of the lumbar spine was performed. No intravenous contrast was administered. COMPARISON:  None. FINDINGS: Segmentation:  Standard. Alignment:  Anatomic. Vertebrae:  No worrisome osseous lesion. Conus medullaris and cauda equina: Conus extends to the L1. level. Conus and cauda equina appear normal. Paraspinal and other soft tissues: Marked bladder distention, query bladder outlet obstruction or neurogenic dysfunction. Disc levels: L1-L2:  Normal. L2-L3:  Normal. L3-L4:  Normal. L4-L5: Central and leftward protrusion. Facet arthropathy. Mild stenosis. Definite impingement not established. L5-S1: Annular bulge. Facet arthropathy. No impingement. In the ventral epidural space opposite S1, there is a T1 and T2 hypointense structure, probable venous plexus although a small caudally extruded disc fragment not completely excluded. No signs of epidural hematoma. No significant cauda equina compression. IMPRESSION: No lumbar compression fracture or traumatic subluxation. Central and leftward protrusion at L4-5 with mild stenosis, but no definite impingement. Annular bulge L5-S1. Small ventral epidural T1 and T2 hypointense structure behind S1, could  represent caudally extruded disc fragment, but no significant cauda equina compression. Marked bladder distention, query bladder outlet obstruction or neurogenic dysfunction. Electronically Signed   By: Staci Righter M.D.   On: 09/05/2017 11:37   Ct Foot  Left Wo Contrast  Result Date: 08/23/2017 CLINICAL DATA:  Diabetic patient with an ulceration on the left heel. EXAM: CT OF THE LEFT FOOT WITHOUT CONTRAST TECHNIQUE: Multidetector CT imaging of the left foot was performed according to the standard protocol. Multiplanar CT image reconstructions were also generated. COMPARISON:  Plain films of the left foot earlier today and 07/10/2016. MRI left ankle 05/20/2016. FINDINGS: Bones/Joint/Cartilage The patient has a remote healed fracture of the posterior, superior calcaneus. Irregularity of cortical bone along the inferior margin of the calcaneus is noted but no cortical destructive change or periosteal reaction is identified. There is some sclerosis of the calcaneus eccentric toward the medial side. No fracture or dislocation. Ligaments Suboptimally assessed by CT. Muscles and Tendons Intact. Soft tissues Large skin ulceration over the heel is identified. No underlying fluid collection is present. No radiopaque foreign body or soft tissue gas. Subcutaneous edema about the ankle is noted. Atherosclerosis is noted. IMPRESSION: Skin ulceration on the heel without CT evidence of underlying osteomyelitis. Negative for abscess. Healed fracture of dorsal calcaneus. Atherosclerosis. Electronically Signed   By: Inge Rise M.D.   On: 08/23/2017 17:20   Mr Foot Left Wo Contrast  Result Date: 09/05/2017 CLINICAL DATA:  Chronic open wound along the left heel. Diffuse swelling in the foot extending into the toes. Diabetes. EXAM: MRI OF THE LEFT HINDFOOT WITHOUT CONTRAST TECHNIQUE: Multiplanar, multisequence MR imaging of the ankle was performed. No intravenous contrast was administered. COMPARISON:  09/04/2017 FINDINGS:  TENDONS Peroneal: Mild tendinopathy of the peroneus brevis. Posteromedial: Unremarkable Anterior: Unremarkable Achilles: Moderate distal Achilles tendinopathy with expansion and increased signal in the tendon. Plantar Fascia: Prominent thickening of the medial and lateral band of the plantar fascia with poor visualization and attenuation of the connection to the plantar calcaneus. This does not represent a total rupture given the apparent continuity on image 17/6, but may represent partial tearing. There is a soft tissue deficiency in this region overlying the plantar calcaneus. LIGAMENTS Lateral: Unremarkable although there appear to be fragmented osteophytes/ossicles deep to the anterior talofibular ligament on images 16-21 of series 4. Medial: Unremarkable CARTILAGE Ankle Joint: Unremarkable Subtalar Joints/Sinus Tarsi: Unremarkable Bones: Abnormal edema compatible with active osteomyelitis involving the posterior calcaneus extending as far forward as the calcaneal neck. Other: Prominent loss of soft tissue thickness along the posterior and plantar portion of the posterior calcaneus compatible with notable ulceration down nearly to the periosteum. Diffuse subcutaneous edema in the ankle tracking into the foot especially dorsally. Speckled low densities in the soft tissues superficial to the plantar fascia and extending distally in the foot, compatible with gas tracking in the soft tissues. This extends at least to the distal metatarsal level. IMPRESSION: 1. 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 There is abnormal gas tracking along the plantar fascia and into the forefoot, extending at least as far as the level of the distal metatarsals, probably reflecting fasciitis. I do not see a drainable soft tissue abscess. 2. The osteomyelitis as undermined the plantar fascia connection to the calcaneus, which is thought to be significantly  partially detached although not totally ruptured. 3. Moderate Achilles tendinopathy distally. 4. Abnormal edema in the ankle and foot suggesting cellulitis. 5. Mild peroneus brevis tendinopathy. Electronically Signed   By: Van Clines M.D.   On: 09/05/2017 08:08   Dg Chest Port 1 View  Result Date: 09/08/2017 CLINICAL DATA:  SOB EXAM: PORTABLE CHEST 1 VIEW COMPARISON:  09/07/2017 FINDINGS: The  heart size and mediastinal contours are within normal limits. Both lungs are clear. The visualized skeletal structures are unremarkable. IMPRESSION: No active disease. Electronically Signed   By: Kathreen Devoid   On: 09/08/2017 13:57   Dg Chest Port 1 View  Result Date: 09/07/2017 CLINICAL DATA:  Increased shortness of breath.  History of asthma. EXAM: PORTABLE CHEST 1 VIEW COMPARISON:  Multiple exams, including 09/04/2017 FINDINGS: Atherosclerotic calcification of the aortic arch. Heart size within normal limits. The lungs appear clear. No pleural effusion. No appreciable bony abnormality. IMPRESSION: 1.  No active cardiopulmonary disease is radiographically apparent. 2.  Aortoiliac atherosclerotic vascular disease. Electronically Signed   By: Van Clines M.D.   On: 09/07/2017 08:28   Dg Chest Port 1 View  Result Date: 09/04/2017 CLINICAL DATA:  Sepsis EXAM: PORTABLE CHEST 1 VIEW COMPARISON:  09/04/2015 CXR FINDINGS: The heart size and mediastinal contours are within normal limits. Minimal aortic atherosclerosis is noted. Both lungs are clear. ACDF hardware across the C5-6 interspace. The visualized skeletal structures are unremarkable. IMPRESSION: No active disease. Electronically Signed   By: Ashley Royalty M.D.   On: 09/04/2017 18:30   Dg Foot 2 Views Left  Result Date: 09/04/2017 CLINICAL DATA:  Sepsis EXAM: LEFT FOOT - 2 VIEW COMPARISON:  08/23/2017 radiographs and CT FINDINGS: Soft tissue wound along the plantar aspect of the heel. Plantar calcaneal cortical irregularity is stable subjacent to  skin ulceration. There is persistent soft tissue swelling along the plantar aspect of the mid and hindfoot though less so since prior. Interval increase in soft tissue swelling is noted of the forefoot however since prior. Lucencies along the plantar aspect of the foot may represent soft tissue emphysema. Remote calcaneal fracture with healing redemonstrated. Degenerative joint space narrowing of DIP and PIP joints of the second through fifth digits, first through fifth MTP articulations and midfoot. IMPRESSION: 1. Redemonstration of large soft tissue defect along the plantar aspect of the calcaneus with cortical irregularity and osteopenia subjacent to the skin wound compatible with chronic changes of osteomyelitis possibly acute on chronic. No significant bony changes are identified. 2. Soft tissue swelling along the plantar aspect of the mid and hindfoot persists with subcutaneous soft tissue emphysema suggested with decreased swelling along the plantar and dorsal aspect of the foot but increased along the dorsum and plantar aspect of the forefoot. Electronically Signed   By: Ashley Royalty M.D.   On: 09/04/2017 18:37   Dg Foot 2 Views Left  Result Date: 08/23/2017 CLINICAL DATA:  Diabetic patient with a nonhealing wound on the left heel. The wound is chronic. EXAM: LEFT FOOT - 2 VIEW COMPARISON:  Plain films of the left foot 07/10/2016. FINDINGS: There is a large skin wound on the plantar surface of the heel. Previously seen fracture of the dorsal calcaneus has healed. There is sclerosis in the calcaneus and irregularity of cortical bone deep to the patient's skin ulceration. No radiopaque foreign body. Atherosclerosis noted. 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 Healed calcaneal fracture. Electronically Signed   By: Inge Rise M.D.   On: 08/23/2017 14:35      Subjective: Patient states that overall he is doing much  better.  Seen up in the chair and his son-in-law is giving him a haircut.  Reports low back/sacral pain but slowly improving compared to admission.  Appropriate postop left lower extremity pain.  Having regular BMs, stool somewhat loose.  Denies any  other complaints.  As per RN, no acute issues reported.  Discharge Exam:  Vitals:   09/09/17 0754 09/09/17 0812 09/09/17 1206 09/09/17 1242  BP:    (!) 151/80  Pulse: 95   90  Resp: 16   16  Temp:  98.3 F (36.8 C) 98.6 F (37 C) 98.6 F (37 C)  TempSrc:  Oral Oral Oral  SpO2: 98%   97%  Weight:      Height:        General: Pleasant middle-aged male, moderately built and nourished, sitting up comfortably in chair. Cardiovascular: S1 & S2 heard, RRR, S1/S2 +. No murmurs, rubs, gallops or clicks. No JVD or pedal edema.  Telemetry personally reviewed: Sinus rhythm in the 90s-occasional sinus tachycardia in the low 100s. Respiratory: Clear to auscultation without wheezing, rhonchi or crackles. No increased work of breathing. Abdominal:  Non distended, non tender & soft. No organomegaly or masses appreciated. Normal bowel sounds heard. CNS: Alert and oriented. No focal deficits.  Left BKA dressing site clean and dry without acute findings. Extremities: no edema, no cyanosis.  Symmetric 5 x 5 power.   The results of significant diagnostics from this hospitalization (including imaging, microbiology, ancillary and laboratory) are listed below for reference.     Microbiology: Recent Results (from the past 240 hour(s))  Blood Culture (routine x 2)     Status: None (Preliminary result)   Collection Time: 09/04/17  5:45 PM  Result Value Ref Range Status   Specimen Description BLOOD RIGHT ANTECUBITAL  Final   Special Requests   Final    BOTTLES DRAWN AEROBIC AND ANAEROBIC Blood Culture adequate volume   Culture   Final    NO GROWTH 4 DAYS Performed at [facility], [address], [place], [place] [address]    Report Status PENDING   Incomplete  Blood Culture (routine x 2)     Status: None (Preliminary result)   Collection Time: 09/04/17  6:04 PM  Result Value Ref Range Status   Specimen Description BLOOD LEFT FOREARM  Final   Special Requests   Final    BOTTLES DRAWN AEROBIC AND ANAEROBIC Blood Culture results may not be optimal due to an inadequate volume of blood received in culture bottles   Culture   Final    NO GROWTH 4 DAYS Performed at [facility], [place] [address], [place], [place] [address]    Report Status PENDING  Incomplete  Urine culture     Status: Abnormal   Collection Time: 09/05/17  4:04 AM  Result Value Ref Range Status   Specimen Description URINE, RANDOM  Final   Special Requests   Final    NONE Performed at [facility], [place] [address], [place], [place] [address]    Culture MULTIPLE SPECIES PRESENT, SUGGEST RECOLLECTION (A)  Final   Report Status 09/06/2017 FINAL  Final     Labs: CBC: Recent Labs  Lab 09/04/17 1745  09/05/17 1856 09/06/17 0327 09/07/17 0126 09/08/17 0453 09/09/17 0443  WBC 16.7*   < > 19.6* 10.7* 11.3* 9.3 9.5  NEUTROABS 14.9*  --   --   --   --   --   --   HGB 6.1*   < > 9.6* 8.5* 9.3* 9.0* 9.1*  HCT 18.7*   < > 28.1* 25.7* 29.2* 27.6* 28.0*  MCV 84.2   < > 82.6 84.8 86.6 85.4 85.4  PLT 299   < > 263 232 310 295 290   < > =  values in this interval not displayed.   Basic Metabolic Panel: Recent Labs  Lab 09/04/17 2303 09/05/17 0625 09/06/17 0327 09/07/17 0126 09/08/17 0453 09/09/17 0443  NA  --  129* 130* 135 136 134*  K  --  3.5 3.0* 4.6 4.2 4.0  CL  --  91* 98* 101 100* 99*  CO2  --  28 24 26 26 27   GLUCOSE  --  113* 210* 120* 154* 249*  BUN  --  22* 24* 22* 15 10  CREATININE  --  1.68* 1.97* 2.09* 1.83* 1.70*  CALCIUM  --  8.4* 7.2* 7.9* 8.1* 8.4*  MG 1.6*  --   --  2.5*  --   --   PHOS 2.0*  --   --   --   --   --    Liver Function Tests: Recent Labs  Lab 09/04/17 1745 09/05/17 0625  AST 18 23  ALT 8* 13*  ALKPHOS 92 107   BILITOT 0.7 1.8*  PROT 5.7* 5.8*  ALBUMIN 1.4* 1.5*   CBG: Recent Labs  Lab 09/08/17 1214 09/08/17 1745 09/08/17 2153 09/09/17 0828 09/09/17 1200  GLUCAP 299* 195* 324* 231* 244*   Urinalysis    Component Value Date/Time   COLORURINE STRAW (A) 09/08/2017 1837   APPEARANCEUR CLEAR 09/08/2017 1837   LABSPEC 1.003 (L) 09/08/2017 1837   PHURINE 8.0 09/08/2017 1837   GLUCOSEU >=500 (A) 09/08/2017 1837   HGBUR MODERATE (A) 09/08/2017 1837   BILIRUBINUR NEGATIVE 09/08/2017 1837   KETONESUR NEGATIVE 09/08/2017 1837   PROTEINUR 100 (A) 09/08/2017 1837   UROBILINOGEN 0.2 10/22/2006 0948   NITRITE NEGATIVE 09/08/2017 1837   LEUKOCYTESUR NEGATIVE 09/08/2017 1837      Time coordinating discharge: 45 minutes  SIGNED:  Vernell Leep, MD, FACP, [facility]. [facility] Pager [phone]  If 7PM-7AM, please contact night-coverage [email or web address] Password [email or web address] 09/09/2017, 2:16 PM

## 2017-09-09 NOTE — Progress Notes (Signed)
Chase Gong, RN  Rehab Admission Coordinator  Physical Medicine and Rehabilitation  PMR Pre-admission  Signed  Date of Service:  09/08/2017 1:40 PM       Related encounter: ED to Hosp-Admission (Current) from 09/04/2017 in [facility]      Signed           Show:Clear all [x] Manual[x] Template[x] Copied  Added by: [x] Chase Gong, RN   [] Hover for details   PMR Admission Coordinator Pre-Admission Assessment  Patient: Chase Clark is an 52 y.o., male MRN: [id number] DOB: [birth year] Height: 5\' 11"  (180.3 cm) Weight: 84.8 kg (187 lb)                                                                                                                                                  Insurance Information HMO:     PPO: yes     PCP:      IPA:      80/20:      OTHER:  PRIMARY: [employer] [place]      Policy#: [id number]      Subscriber: wife CM Name: general number      Phone#: [phone]     Fax#: [phone] Pre-Cert#: [id number]    Approved for 7 days with updates due 09/15/17 to general fax  Employer: [employer] Benefits:  Phone #: [phone]     Name: 09/08/17 Eff. Date: 06/18/17     Deduct: $1250      Out of Pocket Max: $4800      Life Max: unlimited CIR: 80%      SNF: 80% 90 days Outpatient: 80%     Co-Pay: visits per medical neccesity Home Health: 80%      Co-Pay: 100 days DME: 80%     Co-Pay: 20% Providers: in network  SECONDARY: none      Medicaid Application Date:       Case Manager:  Disability Application Date:       Case Worker:   Emergency Publishing copy Information    Name Relation Home Work Mobile   [name], [name] [phone]  [phone]     Current Medical History  Patient Admitting Diagnosis: left BKA  History of Present Illness: [name] is a 52 year old right-handed male with history of CAD with PCI 2007, diabetes mellitus, CKD stage III,  hypertension, chronic left foot ulcer followed at the wound center in [facility] with frequent debridements. Presented 09/04/2017 with generalized weakness and near syncopal episode with fall. Patient with recent admission of proximal 1 week ago for DKA at [facility] long hospital discharge to home returning the following day with weakness. Noted complaints of back pain. Chest x-ray negative. 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 MRI lumbar spine unremarkable. Noted WBC  16,700, lactic acid 4.4. Suspect sepsis related to left foot wound. 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 [name]. Hospital course pain management. Subcutaneous heparin for DVT prophylaxis. Acute on chronic anemia 9.0and monitored. Initially maintained on vancomycin and Zosyn for sepsis protocol with cultures thus far negativeand changed to doxycycline x4 more days and stop.   CKD 3 baseline creatinine around 1.4, worsening due to acute sepsis along with bladder outlet obstruction, placed foley on 09/06/17, placed on flomax, his random ultrasound bladder showed a liter of urine. Plans to continue foley until his activity level increases.  Back pain occurring since fall. MRI non acute continue supportive care. Case discussed with Dr. Arnoldo Morale with neurosurgery with no further workup needed per his recommendation. Sacral pain with xray of sacrum and coccyx with no evidence of fracture or other focal bony lesions.   temp noted 5/28  of 100.2. Urine, chest xray and 2 sets of blood cultures. Gave tylenol and continue to monitor. Question atelectasis. Encourage use of IS.   Past Medical History      Past Medical History:  Diagnosis Date  . Arthritis   . Asthma    as child  . Coronary artery disease    DES DIAG1 11/20/09 [facility])  . Diabetes mellitus without complication ([place])   . Diverticulosis   . Edema, lower extremity   . Fatty liver   . GERD (gastroesophageal reflux disease)   . Gilbert's  disease   . Glaucoma   . Heart disease 2011   Patient has stent for 80% blockage  . Hyperlipidemia   . Hypertension   . Neuropathy   . Seasonal allergies   . Shoulder pain   . Ulcer of foot ([place]) 08.06.2014   DIABETIC ULCERATIONS ASSOCIATED WITH IRRITATION LATERAL ANKLE LEFT GREATER THAN RIGHT WITH MILD CELLULITIS    Family History  family history includes Diabetes in his father and mother; Heart disease in his father; Hyperlipidemia in his father and mother; Hypertension in his father; Liver disease in his mother.  Prior Rehab/Hospitalizations:  Has the patient had major surgery during 100 days prior to admission? No  Current Medications   Current Facility-Administered Medications:  .  acetaminophen (TYLENOL) tablet 325-650 mg, 325-650 mg, Oral, Q6H PRN, Ruthe Mannan, Danielle, PA-C .  acetaminophen (TYLENOL) tablet 650 mg, 650 mg, Oral, Q6H PRN, 650 mg at 09/09/17 0849 **OR** [DISCONTINUED] acetaminophen (TYLENOL) suppository 650 mg, 650 mg, Rectal, Q6H PRN, Laliberte, Danielle, PA-C .  amLODipine (NORVASC) tablet 10 mg, 10 mg, Oral, Daily, Thurnell Lose, MD, 10 mg at 09/09/17 0857 .  aspirin EC tablet 81 mg, 81 mg, Oral, Daily, Grier Mitts, PA-C, 81 mg at 09/09/17 0856 .  brimonidine (ALPHAGAN) 0.2 % ophthalmic solution 1 drop, 1 drop, Right Eye, BID, 1 drop at 09/09/17 0902 **AND** timolol (TIMOPTIC) 0.5 % ophthalmic solution 1 drop, 1 drop, Right Eye, BID, Laliberte, Danielle, PA-C, 1 drop at 09/09/17 0902 .  doxycycline (VIBRA-TABS) tablet 100 mg, 100 mg, Oral, Q12H, Thurnell Lose, MD, 100 mg at 09/09/17 0857 .  famotidine (PEPCID) tablet 20 mg, 20 mg, Oral, BID, Laliberte, Danielle, PA-C, 20 mg at 09/09/17 0859 .  gabapentin (NEURONTIN) capsule 600 mg, 600 mg, Oral, BID, Laliberte, Danielle, PA-C, 600 mg at 09/09/17 0859 .  heparin injection 5,000 Units, 5,000 Units, Subcutaneous, Q8H, Laliberte, Danielle, PA-C, 5,000 Units at 09/09/17 1253 .   HYDROmorphone (DILAUDID) injection 0.5-1 mg, 0.5-1 mg, Intravenous, Q4H PRN, Ruthe Mannan, Danielle, PA-C, 1 mg at 09/06/17 1727 .  insulin aspart (  novoLOG) injection 0-15 Units, 0-15 Units, Subcutaneous, TID WC, Kirby-Graham, Karsten Fells, NP, 5 Units at 09/09/17 1249 .  insulin glargine (LANTUS) injection 50 Units, 50 Units, Subcutaneous, QAC breakfast, Grier Mitts, PA-C, 50 Units at 09/09/17 0853 .  lactated ringers infusion, , Intravenous, Continuous, Grier Mitts, PA-C, Stopped at 09/05/17 0300 .  latanoprost (XALATAN) 0.005 % ophthalmic solution 1 drop, 1 drop, Both Eyes, QHS, Laliberte, Danielle, PA-C .  lidocaine (LIDODERM) 5 % 1 patch, 1 patch, Transdermal, Q24H, Thurnell Lose, MD, 1 patch at 09/08/17 2338 .  [DISCONTINUED] ondansetron (ZOFRAN) tablet 4 mg, 4 mg, Oral, Q6H PRN **OR** ondansetron (ZOFRAN) injection 4 mg, 4 mg, Intravenous, Q6H PRN, Hugelmeyer, Alexis, DO .  oxyCODONE (Oxy IR/ROXICODONE) immediate release tablet 5 mg, 5 mg, Oral, Q6H PRN, Thurnell Lose, MD .  pantoprazole (PROTONIX) EC tablet 40 mg, 40 mg, Oral, Daily, Laliberte, Danielle, PA-C, 40 mg at 09/09/17 0900 .  polyethylene glycol (MIRALAX / GLYCOLAX) packet 17 g, 17 g, Oral, Daily, Candiss Norse, Prashant K, MD .  prednisoLONE acetate (PRED FORTE) 1 % ophthalmic suspension 1 drop, 1 drop, Left Eye, QHS, Hugelmeyer, Alexis, DO .  rosuvastatin (CRESTOR) tablet 10 mg, 10 mg, Oral, Daily, Laliberte, Danielle, PA-C, 10 mg at 09/09/17 0900 .  tamsulosin (FLOMAX) capsule 0.4 mg, 0.4 mg, Oral, Daily, Lala Lund K, MD, 0.4 mg at 09/09/17 0900 .  traMADol (ULTRAM) tablet 50 mg, 50 mg, Oral, Q6H PRN, Grier Mitts, PA-C, 50 mg at 09/05/17 2107  Patients Current Diet:       Diet Order           Diet - low sodium heart healthy        Diet Carb Modified        Diet regular Room service appropriate? 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  Independent  Indoor Mobility: Did the patient need assistance with walking from room to room (with or without device)? Independent  Stairs: Did the patient need assistance with internal or external stairs (with or without device)? Independent  Functional Cognition: Did the patient need help planning regular tasks such as shopping or remembering to take medications? Independent  Current Functional Level Cognition  Overall Cognitive Status: Within Functional Limits for tasks assessed Current Attention Level: Alternating Orientation Level: Oriented X4 General Comments: suspect partly due to pain and lack of sleep    Extremity Assessment (includes Sensation/Coordination)  Upper Extremity Assessment: Overall WFL for tasks assessed  Lower Extremity Assessment: Defer to PT evaluation LLE Deficits / Details: BKA    ADLs  Overall ADL's : Needs assistance/impaired Eating/Feeding: Modified independent, Sitting Grooming: Set up, Sitting Upper Body  Bathing: Min guard, Sitting Lower Body Bathing: Moderate assistance,  Sitting/lateral leans, Sit to/from stand Upper Body Dressing : Min guard, Sitting Lower Body Dressing: Moderate assistance, Sitting/lateral leans, Sit to/from stand Toilet Transfer: Minimal assistance, Stand-pivot, BSC, RW Toileting- Clothing Manipulation and Hygiene: Maximal assistance, Sit to/from stand Toileting - Clothing Manipulation Details (indicate cue type and reason): assist for peri-care after BM while standing at RW  Functional mobility during ADLs: Minimal assistance, Rolling walker(stand pivot transfer) General ADL Comments: pt with increased pain levels this session and fatigued, assisted to [facility] for toileting and back to bed end of session     Mobility  Overal bed mobility: Needs Assistance Bed Mobility: Supine to Sit Supine to sit: Min assist Sit to supine: Min assist Sit to sidelying: Min assist General bed mobility comments: OOB in recliner on entry     Transfers  Overall transfer level: Needs assistance Equipment used: Rolling walker (2 wheeled) Transfers: Sit to/from Stand, [employer] Transfers Sit to Stand: Min assist Stand pivot transfers: Min assist General transfer comment: min A for power up and steadying from RW, from toilet and from straight backed chair, vc for hand placement and sequencing, min guard for pivot transfer to recliner from straight backed chair utilizing Rw    Ambulation / Gait / Stairs / Wheelchair Mobility  Ambulation/Gait Ambulation/Gait assistance: Museum/gallery curator (Feet): 5 Feet(2x 17ft into out of bathroom) Assistive device: Rolling walker (2 wheeled) Gait Pattern/deviations: Decreased stride length(hop to pattern) General Gait Details: min A for steadying with maneuvering into and out of bathroom, v/c for sequencing and pivoting within RW to square with toilet,  Gait velocity: slow Gait velocity interpretation: <1.31 ft/sec, indicative of household ambulator    Posture / Balance Balance Overall balance  assessment: Needs assistance Sitting-balance support: Bilateral upper extremity supported, Feet supported Sitting balance-Leahy Scale: Fair Standing balance support: Bilateral upper extremity supported Standing balance-Leahy Scale: Poor Standing balance comment: reliant on UE support in standing    Special needs/care consideration BiPAP/CPAP  N/a CPM  N/a Continuous Drip IV  N/a Dialysis  N/a Life Vest  N/a Oxygen  N/a Special Bed  N/a Trach Size n/a Wound Vac (area) surgical site       Skin surgical incision with wound vac; right leg/shin with foam dressing Bowel mgmt: LBM 5/28; continent Bladder mgmt: indwelling catheter Diabetic mgmt yes pta; Hgb A1c 15.3 on admit   Previous Home Environment Living Arrangements: Spouse/significant other  Lives With: Spouse Available Help at Discharge: Family, Available 24 hours/day(initially 24/7) Type of Home: House Home Layout: Two level, Able to live on main level with bedroom/bathroom Home Access: Stairs to enter Entrance Stairs-Rails: Right Entrance Stairs-Number of Steps: 2 in front and 4 into garage Bathroom Shower/Tub: Gaffer, Door [employer] Toilet: Standard Bathroom Accessibility: Yes How Accessible: Accessible via walker Home Care Services: No Additional Comments: has adjustable tempurpedic  Discharge Living Setting Plans for Discharge Living Setting: Patient's home, Lives with (comment)(wife) Type of Home at Discharge: House Discharge Home Layout: Two level, Able to live on main level with bedroom/bathroom Discharge Home Access: Stairs to enter Entrance Stairs-Rails: Right Entrance Stairs-Number of Steps: 2 in front and 4 in garage Discharge Bathroom Shower/Tub: Walk-in shower, Tub/shower unit, Door Discharge Bathroom Toilet: Standard Discharge Bathroom Accessibility: Yes How Accessible: Accessible via walker Does the patient have any problems obtaining your medications?: No  Social/Family/Support  Systems Patient Roles: Spouse, Parent Contact Information: wife, [name] Anticipated Caregiver: wife, two daughters and son in Sports coach Anticipated  Caregiver's Contact Information: see above Ability/Limitations of Caregiver: wife is a Marine scientist works days at a surgical center Caregiver Availability: 24/7 Discharge Plan Discussed with Primary Caregiver: Yes Is Caregiver In Agreement with Plan?: Yes Does Caregiver/Family have Issues with Lodging/Transportation while Pt is in Rehab?: No  Goals/Additional Needs Patient/Family Goal for Rehab: Mod I to supervision wiht PT and OT Expected length of stay: ELOS 7 to 12 days Pt/Family Agrees to Admission and willing to participate: Yes Program Orientation Provided & Reviewed with Pt/Caregiver Including Roles  & Responsibilities: Yes  Decrease burden of Care through IP rehab admission: n/a  Possible need for SNF placement upon discharge:not anticipated  Patient Condition: This patient's condition remains as documented in the consult dated 09/07/2017, in which the Rehabilitation Physician determined and documented that the patient's condition is appropriate for intensive rehabilitative care in an inpatient rehabilitation facility. Will admit to inpatient rehab today.  Preadmission Screen Completed By:  Cleatrice Burke, 09/09/2017 1:56 PM ______________________________________________________________________   Discussed status with Dr. Posey Pronto on 09/09/2017 at 1356 and received telephone approval for admission today.  Admission Coordinator:  Cleatrice Burke, time 0865 Date 09/09/2017             Cosigned by: Jamse Arn, MD at 09/09/2017 2:03 PM  Revision History

## 2017-09-09 NOTE — Discharge Instructions (Signed)
Please call [phone] during normal business hours or [phone] after hours for any problems. Including the following:  - excessive redness of the incisions - drainage for more than 4 days - fever of more than 101.5 F  *Please note that pain medications will not be refilled after hours or on weekends.  Additional discharge instructions  Please get your medications reviewed and adjusted by your Primary MD.  Please request your Primary MD to go over all Hospital Tests and Procedure/Radiological results at the follow up, please get all Hospital records sent to your Prim MD by signing hospital release before you go home.  If you had Pneumonia of Lung problems at the Hospital: Please get a 2 view Chest X ray done in 6-8 weeks after hospital discharge or sooner if instructed by your Primary MD.  If you have Congestive Heart Failure: Please call your Cardiologist or Primary MD anytime you have any of the following symptoms:  1) 3 pound weight gain in 24 hours or 5 pounds in 1 week  2) shortness of breath, with or without a dry hacking cough  3) swelling in the hands, feet or stomach  4) if you have to sleep on extra pillows at night in order to breathe  Follow cardiac low salt diet and 1.5 lit/day fluid restriction.  If you have diabetes Accuchecks 4 times/day, Once in AM empty stomach and then before each meal. Log in all results and show them to your primary doctor at your next visit. If any glucose reading is under 80 or above 300 call your primary MD immediately.  If you have Seizure/Convulsions/Epilepsy: Please do not drive, operate heavy machinery, participate in activities at heights or participate in high speed sports until you have seen by Primary MD or a Neurologist and advised to do so again.  If you had Gastrointestinal Bleeding: Please ask your Primary MD to check a complete blood count within one week of discharge or at your next visit. Your endoscopic/colonoscopic  biopsies that are pending at the time of discharge, will also need to followed by your Primary MD.  Get Medicines reviewed and adjusted. Please take all your medications with you for your next visit with your Primary MD  Please request your Primary MD to go over all hospital tests and procedure/radiological results at the follow up, please ask your Primary MD to get all Hospital records sent to his/her office.  If you experience worsening of your admission symptoms, develop shortness of breath, life threatening emergency, suicidal or homicidal thoughts you must seek medical attention immediately by calling 911 or calling your MD immediately  if symptoms less severe.  You must read complete instructions/literature along with all the possible adverse reactions/side effects for all the Medicines you take and that have been prescribed to you. Take any new Medicines after you have completely understood and accpet all the possible adverse reactions/side effects.   Do not drive or operate heavy machinery when taking Pain medications.   Do not take more than prescribed Pain, Sleep and Anxiety Medications  Special Instructions: If you have smoked or chewed Tobacco  in the last 2 yrs please stop smoking, stop any regular Alcohol  and or any Recreational drug use.  Wear Seat belts while driving.  Please note You were cared for by a hospitalist during your hospital stay. If you have any questions about your discharge medications or the care you received while you were in the hospital after you are discharged, you can  call the unit and asked to speak with the hospitalist on call if the hospitalist that took care of you is not available. Once you are discharged, your primary care physician will handle any further medical issues. Please note that NO REFILLS for any discharge medications will be authorized once you are discharged, as it is imperative that you return to your primary care physician (or establish a  relationship with a primary care physician if you do not have one) for your aftercare needs so that they can reassess your need for medications and monitor your lab values.  You can reach the hospitalist office at phone [phone] or fax [phone]   If you do not have a primary care physician, you can call [phone] for a physician referral.

## 2017-09-09 NOTE — H&P (Addendum)
Physical Medicine and Rehabilitation Admission H&P    Chief Complaint  Patient presents with  . Hypotension  : HPI: Chase Clark is a 52 year old right-handed male with history of CAD with PCI 2007, diabetes mellitus, CKD stage III, hypertension, chronic left foot ulcer followed at the wound center in [facility] with frequent debridements.  Per chart review and daughter he lives with spouse.  Reported to be independent prior to admission.  2 level home with bedroom on Main level and 2 steps to entry of home.  Wife works during the day.  Presented 09/04/2017 with generalized weakness and near syncopal episode with fall.  Patient with recent admission of proximal 1 week ago for DKA at [facility] long hospital discharge to home returning the following day with weakness.  Noted complaints of back pain.  Chest x-ray negative.  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  MRI lumbar spine unremarkable.  Noted WBC 16,700, lactic acid 4.4.  Suspect sepsis related to left foot wound.  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 [name].  Hospital course pain management.  Subcutaneous heparin for DVT prophylaxis.  Acute on chronic anemia 9.1 and monitored.  Initially maintained on vancomycin and Zosyn for sepsis protocol with cultures thus far negative and changed to doxycycline x4 more days and stop.  Physical occupational therapy evaluations completed with recommendations of physical medicine rehab consult.  Patient was admitted for a comprehensive rehab program.  Review of Systems  Constitutional: Negative for fever.  HENT: Negative for hearing loss.   Eyes: Negative for blurred vision and double vision.  Respiratory: Negative for cough and shortness of breath.   Cardiovascular: Positive for leg swelling. Negative for chest pain and palpitations.  Gastrointestinal: Positive for constipation. Negative for nausea and vomiting.       GERD  Musculoskeletal: Positive for joint pain and myalgias.    Skin: Negative for rash.  Neurological: Positive for dizziness and sensory change.  All other systems reviewed and are negative.  Past Medical History:  Diagnosis Date  . Arthritis   . Asthma    as child  . Coronary artery disease    DES DIAG1 11/20/09 [employer])  . Diabetes mellitus without complication ([place])   . Diverticulosis   . Edema, lower extremity   . Fatty liver   . GERD (gastroesophageal reflux disease)   . Gilbert's disease   . Glaucoma   . Heart disease 2011   Patient has stent for 80% blockage  . Hyperlipidemia   . Hypertension   . Neuropathy   . Seasonal allergies   . Shoulder pain   . Ulcer of foot ([place]) 08.06.2014   DIABETIC ULCERATIONS ASSOCIATED WITH IRRITATION LATERAL ANKLE LEFT GREATER THAN RIGHT WITH MILD CELLULITIS   Past Surgical History:  Procedure Laterality Date  . ABDOMINAL AORTOGRAM N/A 05/20/2016   Procedure: Abdominal Aortogram possible intervention;  Surgeon: Katha Cabal, MD;  Location: [place] [facility];  Service: Cardiovascular;  Laterality: N/A;  . AMPUTATION Left 09/05/2017   Procedure: AMPUTATION BELOW KNEE;  Surgeon: Tania Ade, MD;  Location: [place];  Service: Orthopedics;  Laterality: Left;  . ANTERIOR CERVICAL DECOMP/DISCECTOMY FUSION N/A 05/07/2017   Procedure: ANTERIOR CERVICAL DECOMPRESSION/DISCECTOMY Luevenia Maxin PROSTHESIS,PLATE/SCREWS CERVICAL FIVE - CERVICAL SIX;  Surgeon: Newman Pies, MD;  Location: [place];  Service: Neurosurgery;  Laterality: N/A;  . CARDIAC CATHETERIZATION    . CATARACT EXTRACTION W/ INTRAOCULAR LENS IMPLANT    . CATARACT EXTRACTION W/PHACO Left 02/26/2017  Procedure: CATARACT EXTRACTION PHACO AND INTRAOCULAR LENS PLACEMENT (IOC);  Surgeon: Eulogio Bear, MD;  Location: [facility];  Service: Ophthalmology;  Laterality: Left;  Lot # [id number] H [place]: 00:25.3 AP%: 6.2 CDE: 1.59   . CHOLECYSTECTOMY N/A   . CORONARY ANGIOPLASTY WITH STENT PLACEMENT  2007   1st diagonal  . EPIBLEPHERON REPAIR  WITH TEAR DUCT PROBING    . EYE SURGERY Right sept 29n2015   cataract extraction  . INSERTION EXPRESS TUBE SHUNT Right 09/06/2015   Procedure: INSERTION AHMED TUBE SHUNT with tutoplast allograft;  Surgeon: Eulogio Bear, MD;  Location: [facility];  Service: Ophthalmology;  Laterality: Right;  Marland Kitchen VASECTOMY     Family History  Problem Relation Age of Onset  . Hyperlipidemia Mother   . Diabetes Mother   . Liver disease Mother        NASH  . Hyperlipidemia Father   . Diabetes Father   . Heart disease Father   . Hypertension Father    Social History:  reports that he quit smoking about 9 years ago. His smoking use included cigarettes. He has never used smokeless tobacco. He reports that he drinks alcohol. He reports that he does not use drugs. Allergies:  Allergies  Allergen Reactions  . Atorvastatin Other (See Comments)    Muscle aches    Medications Prior to Admission  Medication Sig Dispense Refill  . aspirin EC 81 MG tablet Take 81 mg by mouth daily.    . brimonidine-timolol (COMBIGAN) 0.2-0.5 % ophthalmic solution Place 1 drop into the right eye every 12 (twelve) hours.     . DUREZOL 0.05 % EMUL Place 1 drop into the left eye at bedtime  1  . esomeprazole (NEXIUM) 40 MG capsule Take 1 capsule (40 mg total) by mouth daily. (Patient taking differently: Take 40 mg by mouth daily. )    . furosemide (LASIX) 40 MG tablet Take 40 mg by mouth daily as needed for fluid or edema.   11  . gabapentin (NEURONTIN) 300 MG capsule Take 2 capsules (600 mg total) by mouth 2 (two) times daily. 360 capsule 3  . insulin aspart (NOVOLOG FLEXPEN) 100 UNIT/ML FlexPen CBG121-150: 1u; CBG151-200: 2u; CBG201-250: 3u; CBG251-300: 5u; CBG301-350: 7u; CBG351-400: 9u (Patient taking differently: Inject 1-9 Units into the skin See admin instructions. Inject 1-9 units into the skin three times a day before meals, per sliding scale: BGL 121-150 = 1 unit; 151-200 = 2 units; 201-250 = 3 units; 251-300 = 5 units;  301-350 = 7 units; 351-400 = 9 units) 5 pen 0  . insulin glargine (LANTUS) 100 UNIT/ML injection Inject 0.5 mLs (50 Units total) into the skin daily. (Patient taking differently: Inject 50 Units into the skin daily before breakfast. )    . latanoprost (XALATAN) 0.005 % ophthalmic solution Place 1 drop into both eyes at bedtime.  5  . losartan-hydrochlorothiazide (HYZAAR) 100-25 MG tablet Take 1 tablet by mouth daily.  11  . Menthol, Topical Analgesic, (BLUE-EMU MAXIMUM STRENGTH EX) Apply 1 application topically See admin instructions. Apply to the back daily as directed for pain    . metFORMIN (GLUCOPHAGE) 500 MG tablet Take 500 mg by mouth 2 (two) times daily with a meal.    . metoprolol tartrate (LOPRESSOR) 100 MG tablet Take 1 tablet (100 mg total) by mouth 2 (two) times daily. 180 tablet 1  . rosuvastatin (CRESTOR) 10 MG tablet Take 10 mg by mouth daily.  3  . Continuous Blood Gluc Receiver (DEXCOM G6  RECEIVER) DEVI 1 each by Does not apply route as directed. Use to check blood glucose 5 times daily 1 Device 0  . Continuous Blood Gluc Sensor (DEXCOM G6 SENSOR) MISC 1 each by Does not apply route as directed. Place 1 sensor subcutaneously once every 10 days. 3 each 11  . Continuous Blood Gluc Transmit (DEXCOM G6 TRANSMITTER) MISC 1 each by Does not apply route as directed. Use to check blood glucose 5 times daily. Refill every 3 months. 1 each 3  . Insulin Pen Needle (PEN NEEDLES) 32G X 4 MM MISC Inject 90 each 3 (three) times daily into the skin. 100 each 11  . metoCLOPramide (REGLAN) 5 MG tablet Take 1 tablet (5 mg total) by mouth 4 (four) times daily -  before meals and at bedtime. 120 tablet 0  . oxyCODONE 10 MG TABS Take 1 tablet (10 mg total) by mouth every 4 (four) hours as needed for severe pain ((score 7 to 10)). (Patient not taking: Reported on 09/04/2017) 30 tablet 0  . rosuvastatin (CRESTOR) 20 MG tablet Take 1 tablet (20 mg total) daily by mouth. (Patient not taking: Reported on  09/04/2017) 30 tablet 11    Drug Regimen Review Drug regimen was reviewed and remains appropriate with no significant issues identified  Home: Home Living Family/patient expects to be discharged to:: Private residence Living Arrangements: Spouse/significant other Available Help at Discharge: Family Type of Home: House Home Access: Stairs to enter [employer] of Steps: 2 in front and 4 into garage Entrance Stairs-Rails: Right Home Layout: Two level, Able to live on main level with bedroom/bathroom Bathroom Shower/Tub: Gaffer, Door [employer] Toilet: Standard Bathroom Accessibility: Yes Home Equipment: Environmental consultant - 2 wheels, Transport chair Additional Comments: has adjustable tempurpedic   Functional History: Prior Function Level of Independence: Independent with assistive device(s) Comments: 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 Was completely independent prior to initial admission  Functional Status:  Mobility: Bed Mobility Overal bed mobility: Needs Assistance Bed Mobility: Supine to Sit, Sit to Sidelying Supine to sit: Min assist, HOB elevated Sit to supine: Min assist Sit to sidelying: Min assist General bed mobility comments: MIn A to elevate trunk into sitting, pt requiring cues for sequencing and for use of UEs to assist; minA for RLE when returning to sidelying; educated on use of log roll technique due to back pain with pt verbalizing understanding Transfers Overall transfer level: Needs assistance Equipment used: Rolling walker (2 wheeled) Transfers: Sit to/from Stand, [employer] Transfers Sit to Stand: Mod assist, Min assist, From elevated surface Stand pivot transfers: Min assist General transfer comment: ModA initially from EOB to rise and steady at RW, Pt able to stand from [facility] with MinA; min steadying assist to take small pivotal hops EOB<>BSC; VCs for sequencing RW use and for hand placement during transfers  Ambulation/Gait General  Gait Details: not assessed    ADL: ADL Overall ADL's : Needs assistance/impaired Eating/Feeding: Modified independent, Sitting Grooming: Set up, Sitting Upper Body Bathing: Min guard, Sitting Lower Body Bathing: Moderate assistance, Sitting/lateral leans, Sit to/from stand Upper Body Dressing : Min guard, Sitting Lower Body Dressing: Moderate assistance, Sitting/lateral leans, Sit to/from stand Toilet Transfer: Minimal assistance, Stand-pivot, BSC, RW Toileting- Clothing Manipulation and Hygiene: Maximal assistance, Sit to/from stand Toileting - Clothing Manipulation Details (indicate cue type and reason): assist for peri-care after BM while standing at RW  Functional mobility during ADLs: Minimal assistance, Rolling walker(stand pivot transfer) General ADL Comments: pt with increased pain levels  this session and fatigued, assisted to [facility] for toileting and back to bed end of session   Cognition: Cognition Overall Cognitive Status: Impaired/Different from baseline Orientation Level: Oriented X4 Cognition Arousal/Alertness: Lethargic Behavior During Therapy: WFL for tasks assessed/performed Overall Cognitive Status: Impaired/Different from baseline Area of Impairment: Attention, Memory Current Attention Level: Alternating Memory: Decreased short-term memory General Comments: suspect partly due to pain and lack of sleep  Physical Exam: Blood pressure 128/87, pulse 94, temperature 99.6 F (37.6 C), temperature source Oral, resp. rate 10, height '5\' 11"'  (1.803 m), weight 84.8 kg (187 lb), SpO2 98 %.  Constitutional: He appears well-developed and well-nourished. NAD. HENT:  Head: Normocephalic and atraumatic.  Eyes: EOM are normal. No discharge.  Neck: Normal range of motion. Neck supple. No thyromegaly present.  Cardiovascular: Regular rhythm and normal heart sounds.  +Tachycardia  Respiratory: Effort normal and breath sounds normal. No respiratory distress.  GI: Soft. Bowel  sounds are normal. He exhibits no distension.  Musculoskeletal:  Left stump with tenderness Left hand edema, improved Neurological: He is alert.    Alert and oriented. Motor: B/l UE 5/5 proximal to distal RLE: 4+/5 proximal to distal (pain inhibition) LLE: HF 4+/5 (pain inhibition)  Skin: Skin is warm and dry.  BKA site with dressing C/C/I Psychiatric: He has a normal mood and affect. His behavior is normal.   Results for orders placed or performed during the hospital encounter of 09/04/17 (from the past 48 hour(s))  Glucose, capillary     Status: Abnormal   Collection Time: 09/06/17  8:48 AM  Result Value Ref Range   Glucose-Capillary 154 (H) 65 - 99 mg/dL  Glucose, capillary     Status: Abnormal   Collection Time: 09/06/17 12:51 PM  Result Value Ref Range   Glucose-Capillary 159 (H) 65 - 99 mg/dL  Glucose, capillary     Status: Abnormal   Collection Time: 09/06/17  6:19 PM  Result Value Ref Range   Glucose-Capillary 189 (H) 65 - 99 mg/dL  Glucose, capillary     Status: Abnormal   Collection Time: 09/06/17  9:46 PM  Result Value Ref Range   Glucose-Capillary 175 (H) 65 - 99 mg/dL  Glucose, capillary     Status: Abnormal   Collection Time: 09/07/17 12:13 AM  Result Value Ref Range   Glucose-Capillary 163 (H) 65 - 99 mg/dL  Basic metabolic panel     Status: Abnormal   Collection Time: 09/07/17  1:26 AM  Result Value Ref Range   Sodium 135 135 - 145 mmol/L   Potassium 4.6 3.5 - 5.1 mmol/L   Chloride 101 101 - 111 mmol/L   CO2 26 22 - 32 mmol/L   Glucose, Bld 120 (H) 65 - 99 mg/dL   BUN 22 (H) 6 - 20 mg/dL   Creatinine, Ser 2.09 (H) 0.61 - 1.24 mg/dL   Calcium 7.9 (L) 8.9 - 10.3 mg/dL   GFR calc non Af Amer 34 (L) >60 mL/min   GFR calc Af Amer 40 (L) >60 mL/min    Comment: (NOTE) The eGFR has been calculated using the CKD EPI equation. This calculation has not been validated in all clinical situations. eGFR's persistently <60 mL/min signify possible Chronic  Kidney Disease.    Anion gap 8 5 - 15    Comment: Performed at [facility] [address], [place], [place] [address]  Magnesium     Status: Abnormal   Collection Time: 09/07/17  1:26 AM  Result Value Ref Range  Magnesium 2.5 (H) 1.7 - 2.4 mg/dL    Comment: Performed at [facility], [place] [address] [place], [place] [address]  CBC     Status: Abnormal   Collection Time: 09/07/17  1:26 AM  Result Value Ref Range   WBC 11.3 (H) 4.0 - 10.5 K/uL   RBC 3.37 (L) 4.22 - 5.81 MIL/uL   Hemoglobin 9.3 (L) 13.0 - 17.0 g/dL   HCT 29.2 (L) 39.0 - 52.0 %   MCV 86.6 78.0 - 100.0 fL   MCH 27.6 26.0 - 34.0 pg   MCHC 31.8 30.0 - 36.0 g/dL   RDW 13.9 11.5 - 15.5 %   Platelets 310 150 - 400 K/uL    Comment: Performed at [facility], [place] [address] [place], [place] [address]  Glucose, capillary     Status: Abnormal   Collection Time: 09/07/17  5:10 AM  Result Value Ref Range   Glucose-Capillary 129 (H) 65 - 99 mg/dL  Glucose, capillary     Status: Abnormal   Collection Time: 09/07/17  8:43 AM  Result Value Ref Range   Glucose-Capillary 109 (H) 65 - 99 mg/dL  Glucose, capillary     Status: Abnormal   Collection Time: 09/07/17 11:42 AM  Result Value Ref Range   Glucose-Capillary 209 (H) 65 - 99 mg/dL   Comment 1 Notify RN    Comment 2 Document in Chart   Glucose, capillary     Status: Abnormal   Collection Time: 09/07/17  5:30 PM  Result Value Ref Range   Glucose-Capillary 329 (H) 65 - 99 mg/dL   Comment 1 Notify RN    Comment 2 Document in Chart   Glucose, capillary     Status: Abnormal   Collection Time: 09/07/17  8:01 PM  Result Value Ref Range   Glucose-Capillary 264 (H) 65 - 99 mg/dL  Glucose, capillary     Status: Abnormal   Collection Time: 09/08/17 12:04 AM  Result Value Ref Range   Glucose-Capillary 153 (H) 65 - 99 mg/dL  Basic metabolic panel     Status: Abnormal   Collection Time: 09/08/17  4:53 AM  Result Value Ref Range   Sodium 136 135 - 145 mmol/L    Potassium 4.2 3.5 - 5.1 mmol/L    Comment: SPECIMEN HEMOLYZED. HEMOLYSIS MAY AFFECT INTEGRITY OF RESULTS.   Chloride 100 (L) 101 - 111 mmol/L   CO2 26 22 - 32 mmol/L   Glucose, Bld 154 (H) 65 - 99 mg/dL   BUN 15 6 - 20 mg/dL   Creatinine, Ser 1.83 (H) 0.61 - 1.24 mg/dL   Calcium 8.1 (L) 8.9 - 10.3 mg/dL   GFR calc non Af Amer 40 (L) >60 mL/min   GFR calc Af Amer 47 (L) >60 mL/min    Comment: (NOTE) The eGFR has been calculated using the CKD EPI equation. This calculation has not been validated in all clinical situations. eGFR's persistently <60 mL/min signify possible Chronic Kidney Disease.    Anion gap 10 5 - 15    Comment: Performed at [facility] [address], [place], [place] [address]  CBC     Status: Abnormal   Collection Time: 09/08/17  4:53 AM  Result Value Ref Range   WBC 9.3 4.0 - 10.5 K/uL   RBC 3.23 (L) 4.22 - 5.81 MIL/uL   Hemoglobin 9.0 (L) 13.0 - 17.0 g/dL   HCT 27.6 (L) 39.0 - 52.0 %   MCV 85.4 78.0 -  100.0 fL   MCH 27.9 26.0 - 34.0 pg   MCHC 32.6 30.0 - 36.0 g/dL   RDW 13.4 11.5 - 15.5 %   Platelets 295 150 - 400 K/uL    Comment: Performed at [facility], [place] [address] [place], [place] [address]  Glucose, capillary     Status: Abnormal   Collection Time: 09/08/17  4:55 AM  Result Value Ref Range   Glucose-Capillary 138 (H) 65 - 99 mg/dL   Dg Chest Port 1 View  Result Date: 09/07/2017 CLINICAL DATA:  Increased shortness of breath.  History of asthma. EXAM: PORTABLE CHEST 1 VIEW COMPARISON:  Multiple exams, including 09/04/2017 FINDINGS: Atherosclerotic calcification of the aortic arch. Heart size within normal limits. The lungs appear clear. No pleural effusion. No appreciable bony abnormality. IMPRESSION: 1.  No active cardiopulmonary disease is radiographically apparent. 2.  Aortoiliac atherosclerotic vascular disease. Electronically Signed   By: Van Clines M.D.   On: 09/07/2017 08:28       Medical Problem List and Plan: 1.   Decreased functional mobility secondary to sepsis secondary to left diabetic foot ulcer status post left BKA 09/05/2017 2.  DVT Prophylaxis/Anticoagulation: Subcutaneous heparin.  Monitor for any bleeding episodes 3. Pain Management: Neurontin 600 mg twice daily, Lidoderm patch, Ultram and oxycodone as needed 4. Mood: Provide emotional support 5. Neuropsych: This patient is capable of making decisions on his own behalf. 6. Skin/Wound Care: Routine skin checks 7. Fluids/Electrolytes/Nutrition: Routine in and outs with follow-up chemistries 8.  Acute blood loss anemia.  Follow-up CBC 9.  ID/sepsis.  Cultures thus far remain negative.  Vancomycin and Zosyn changed to doxycycline 09/08/2017 4 more days and stopped 10.  CKD stage III.  Follow-up chemistries 11.  Diabetes mellitus with peripheral neuropathy as well as history of DKA.  Hemoglobin A1c 15.3.  Lantus insulin 50 units daily.  Check blood sugars before meals and at bedtime.  Diabetic teaching 12.  CAD with PCI stenting 2007.  Continue aspirin.  No chest pain or shortness of breath 13.  Hypertension.  Norvasc 10 mg daily 14.  Hyperlipidemia.  Crestor 15.  BPH.  Flomax 0.4 mg daily.  Check PVR x3   Post Admission Physician Evaluation: 1. Preadmission assessment reviewed and changes made below. 2. Functional deficits secondary  to left BKA. 3. Patient is admitted to receive collaborative, interdisciplinary care between the physiatrist, rehab nursing staff, and therapy team. 4. Patient's level of medical complexity and substantial therapy needs in context of that medical necessity cannot be provided at a lesser intensity of care such as a SNF. 5. Patient has experienced substantial functional loss from his/her baseline which was documented above under the "Functional History" and "Functional Status" headings.  Judging by the patient's diagnosis, physical exam, and functional history, the patient has potential for functional progress which will  result in measurable gains while on inpatient rehab.  These gains will be of substantial and practical use upon discharge  in facilitating mobility and self-care at the household level. 6. Physiatrist will provide 24 hour management of medical needs as well as oversight of the therapy plan/treatment and provide guidance as appropriate regarding the interaction of the two. 7. 24 hour rehab nursing will assist with bladder management, safety, skin/wound care, disease management, pain management and patient education  and help integrate therapy concepts, techniques,education, etc. 8. PT will assess and treat for/with: Lower extremity strength, range of motion, stamina, balance, functional mobility, safety, adaptive techniques and equipment, wound care, coping  skills, pain control, pre-prosthetic education. Goals are: Supervision. 9. OT will assess and treat for/with: ADL's, functional mobility, safety, upper extremity strength, adaptive techniques and equipment, wound mgt, ego support, and community reintegration.   Goals are: Supervision. Therapy may not proceed with showering this patient. 10. Case Management and Social Worker will assess and treat for psychological issues and discharge planning. 11. Team conference will be held weekly to assess progress toward goals and to determine barriers to discharge. 12. Patient will receive at least 3 hours of therapy per day at least 5 days per week. 13. ELOS: 7-10 days       14. Prognosis:  good  I have personally performed a face to face diagnostic evaluation, including, but not limited to relevant history and physical exam findings, of this patient and developed relevant assessment and plan.  Additionally, I have reviewed and concur with the physician assistant's documentation above.  Delice Lesch, MD, ABPMR Lavon Paganini Angiulli, PA-C 09/08/2017

## 2017-09-09 NOTE — Progress Notes (Signed)
Jamse Arn, MD      Jamse Arn, MD  Physician  Physical Medicine and Rehabilitation      Consult Note  Signed     Date of Service:  09/07/2017  8:38 AM         Related encounter: ED to Hosp-Admission (Current) from 09/04/2017 in [facility]             Signed          Expand All Collapse All            Expand widget buttonCollapse widget button    Show:Clear all   ManualTemplateCopied  Added by:     Angiulli, Lavon Paganini, PA-C  Jamse Arn, MD   Hover for detailscustomization button                                                                                                                                                                                                        untitled image              Physical Medicine and Rehabilitation Consult  Reason for Consult: Decreased functional mobility  Referring Physician: [facility]        HPI: Chase Clark is a 52 y.o. right-handed male with history of CAD, diabetes mellitus, hypertension, chronic left foot ulcer followed at the wound center in [facility] with frequent debridements.  Per chart review, patient, and daughter, patient lives with spouse.  Reported to be independent with assistive device prior to admission.  2 level home with bedroom on [address].  2 steps to entry.  Wife works during the day.  Presented 09/04/2017 with generalized weakness and near syncope/fall.  Patient with recent admission approximately 1 week ago for DKA at [facility] discharged to home returning the following day with weakness.  Noted complaints of back pain.  Chest x-ray negative.  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  MRI lumbar spine reviewed, unremarkable for acute process. Noted WBC 16,700, lactic acid 4.44.  Suspect sepsis related to left foot wound.  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 [name].  Hospital course pain management.  Subcutaneous heparin for DVT prophylaxis.  Acute on chronic anemia 9.3 and monitored.  Patient currently remains on vancomycin and Zosyn for sepsis protocol.  Physical therapy evaluation completed 09/06/2017 with recommendations of physical medicine rehab consult.        Review  of Systems   Constitutional: Negative for fever.   HENT: Negative for hearing loss.    Eyes: Negative for blurred vision and double vision.   Respiratory: Negative for cough and shortness of breath.    Cardiovascular: Positive for leg swelling. Negative for chest pain and palpitations.   Gastrointestinal: Positive for constipation. Negative for nausea and vomiting.        GERD   Genitourinary: Negative for dysuria, flank pain and hematuria.   Musculoskeletal: Positive for joint pain and myalgias.   Skin: Negative for rash.   Neurological: Positive for dizziness and sensory change.   All other systems reviewed and are negative.          Past Medical History:    Diagnosis   Date    .   Arthritis        .   Asthma            as child    .   Coronary artery disease            DES DIAG1 11/20/09 [facility])    .   Diabetes mellitus without complication ([place])        .   Diverticulosis        .   Edema, lower extremity        .   Fatty liver        .   GERD (gastroesophageal reflux disease)        .   Gilbert's disease        .   Glaucoma        .   Heart disease   2011        Patient has stent for 80% blockage    .   Hyperlipidemia        .   Hypertension        .   Neuropathy        .   Seasonal allergies        .   Shoulder pain        .   Ulcer of foot  ([place])   08.06.2014        DIABETIC ULCERATIONS ASSOCIATED WITH IRRITATION LATERAL ANKLE LEFT GREATER THAN RIGHT WITH MILD CELLULITIS             Past Surgical History:    Procedure   Laterality   Date    .   ABDOMINAL AORTOGRAM   N/A   05/20/2016        Procedure: Abdominal Aortogram possible intervention;  Surgeon: Katha Cabal, MD;  Location: [place] [facility];  Service: Cardiovascular;  Laterality: N/A;    .   AMPUTATION   Left   09/05/2017        Procedure: AMPUTATION BELOW KNEE;  Surgeon: Tania Ade, MD;  Location: [place];  Service: Orthopedics;  Laterality: Left;    .   ANTERIOR CERVICAL DECOMP/DISCECTOMY FUSION   N/A   05/07/2017        Procedure: ANTERIOR CERVICAL DECOMPRESSION/DISCECTOMY Luevenia Maxin PROSTHESIS,PLATE/SCREWS CERVICAL FIVE - CERVICAL SIX;  Surgeon: Newman Pies, MD;  Location: [place];  Service: Neurosurgery;  Laterality: N/A;    .   CARDIAC CATHETERIZATION            .   CATARACT EXTRACTION W/ INTRAOCULAR LENS IMPLANT            .   CATARACT EXTRACTION W/PHACO   Left   02/26/2017  Procedure: CATARACT EXTRACTION PHACO AND INTRAOCULAR LENS PLACEMENT (IOC);  Surgeon: Eulogio Bear, MD;  Location: [facility];  Service: Ophthalmology;  Laterality: Left;  Lot # [id number]  [place]: 00:25.3  AP%: 6.2  CDE: 1.59       .   CHOLECYSTECTOMY   N/A        .   CORONARY ANGIOPLASTY WITH STENT PLACEMENT       2007        1st diagonal    .   EPIBLEPHERON REPAIR WITH TEAR DUCT PROBING            .   EYE SURGERY   Right   sept [id number]        cataract extraction    .   INSERTION EXPRESS TUBE SHUNT   Right   09/06/2015        Procedure: INSERTION AHMED TUBE SHUNT with tutoplast allograft;  Surgeon: Eulogio Bear, MD;  Location: [facility];  Service: Ophthalmology;  Laterality: Right;    Marland Kitchen   VASECTOMY                      Family History    Problem   Relation   Age of Onset    .   Hyperlipidemia   Mother        .   Diabetes   Mother        .   Liver disease   Mother                NASH    .   Hyperlipidemia   Father        .   Diabetes   Father        .   Heart disease   Father        .   Hypertension   Father           Social History:  reports that he quit smoking about 9 years ago. His smoking use included cigarettes. He has never used smokeless tobacco. He reports that he drinks alcohol. He reports that he does not use drugs.  Allergies:         Allergies    Allergen   Reactions    .   Atorvastatin   Other (See Comments)            Muscle aches                 Medications Prior to Admission    Medication   Sig   Dispense   Refill    .   aspirin EC 81 MG tablet   Take 81 mg by mouth daily.            .   brimonidine-timolol (COMBIGAN) 0.2-0.5 % ophthalmic solution   Place 1 drop into the right eye every 12 (twelve) hours.             .   DUREZOL 0.05 % EMUL   Place 1 drop into the left eye at bedtime       1    .   esomeprazole (NEXIUM) 40 MG capsule   Take 1 capsule (40 mg total) by mouth daily. (Patient taking differently: Take 40 mg by mouth daily. )            .   furosemide (LASIX) 40 MG tablet   Take 40 mg by mouth daily as needed for fluid or edema.  11    .   gabapentin (NEURONTIN) 300 MG capsule   Take 2 capsules (600 mg total) by mouth 2 (two) times daily.   360 capsule   3    .   insulin aspart (NOVOLOG FLEXPEN) 100 UNIT/ML FlexPen   CBG121-150: 1u; CBG151-200: 2u; CBG201-250: 3u; CBG251-300: 5u; CBG301-350: 7u; CBG351-400: 9u (Patient taking differently: Inject 1-9 Units into the skin See admin instructions. Inject 1-9 units into the skin three times a day before meals, per sliding scale: BGL 121-150 = 1 unit; 151-200  = 2 units; 201-250 = 3 units; 251-300 = 5 units; 301-350 = 7 units; 351-400 = 9 units)   5 pen   0    .   insulin glargine (LANTUS) 100 UNIT/ML injection   Inject 0.5 mLs (50 Units total) into the skin daily. (Patient taking differently: Inject 50 Units into the skin daily before breakfast. )            .   latanoprost (XALATAN) 0.005 % ophthalmic solution   Place 1 drop into both eyes at bedtime.       5    .   losartan-hydrochlorothiazide (HYZAAR) 100-25 MG tablet   Take 1 tablet by mouth daily.       11    .   Menthol, Topical Analgesic, (BLUE-EMU MAXIMUM STRENGTH EX)   Apply 1 application topically See admin instructions. Apply to the back daily as directed for pain            .   metFORMIN (GLUCOPHAGE) 500 MG tablet   Take 500 mg by mouth 2 (two) times daily with a meal.            .   metoprolol tartrate (LOPRESSOR) 100 MG tablet   Take 1 tablet (100 mg total) by mouth 2 (two) times daily.   180 tablet   1    .   rosuvastatin (CRESTOR) 10 MG tablet   Take 10 mg by mouth daily.       3    .   Continuous Blood Gluc Receiver ([place]) DEVI   1 each by Does not apply route as directed. Use to check blood glucose 5 times daily   1 Device   0    .   Continuous Blood Gluc Sensor (DEXCOM G6 SENSOR) MISC   1 each by Does not apply route as directed. Place 1 sensor subcutaneously once every 10 days.   3 each   11    .   Continuous Blood Gluc Transmit (DEXCOM G6 TRANSMITTER) MISC   1 each by Does not apply route as directed. Use to check blood glucose 5 times daily. Refill every 3 months.   1 each   3    .   Insulin Pen Needle (PEN NEEDLES) 32G X 4 MM MISC   Inject 90 each 3 (three) times daily into the skin.   100 each   11    .   metoCLOPramide (REGLAN) 5 MG tablet   Take 1 tablet (5 mg total) by mouth 4 (four) times daily -  before meals and at bedtime.   120 tablet   0     .   oxyCODONE 10 MG TABS   Take 1 tablet (10 mg total) by mouth every 4 (four) hours as needed for severe pain ((score 7 to 10)). (Patient not taking: Reported on 09/04/2017)   30 tablet   0    .  rosuvastatin (CRESTOR) 20 MG tablet   Take 1 tablet (20 mg total) daily by mouth. (Patient not taking: Reported on 09/04/2017)   30 tablet   11          Home:  [place] expects to be discharged to:: Private residence  Living Arrangements: Spouse/significant other  Available Help at Discharge: Family, Available 24 hours/day  Type of Home: House  Home Access: Stairs to enter  [employer] of Steps: 2 in front and 4 into garage  Entrance Stairs-Rails: Right  Home Layout: Two level, Able to live on main level with bedroom/bathroom  Bathroom Shower/Tub: Gaffer, Door  [employer] Toilet: Standard  Bathroom Accessibility: Yes  Home Equipment: Environmental consultant - 2 wheels, Transport chair  Additional Comments: has adjustable tempurpedic   Functional History:  Prior Function  Level of Independence: Independent with assistive device(s)  Comments: 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 Was completely independent prior to admission  Functional Status:   Mobility:  Bed Mobility  Overal bed mobility: Needs Assistance  Bed Mobility: Supine to Sit, Sit to Supine  Supine to sit: Min assist, HOB elevated  Sit to supine: Min assist  General bed mobility comments: MIn A for safety   Transfers  Overall transfer level: Needs assistance  Equipment used: Rolling walker (2 wheeled)  Transfers: Sit to/from Stand, [employer] Transfers  Sit to Stand: Min assist, From elevated surface  Stand pivot transfers: Min assist  General transfer comment: Min A with RW to stand and pivot transfer from bed <> recliner  Ambulation/Gait  General Gait Details: not assessed       ADL:        Cognition:  Cognition  Overall Cognitive Status: Impaired/Different from baseline  Orientation Level: Oriented X4  Cognition  Arousal/Alertness: Lethargic  Behavior During Therapy: WFL for tasks assessed/performed  Overall Cognitive Status: Impaired/Different from baseline  Area of Impairment: Attention, Memory  Current Attention Level: Alternating  Memory: Decreased short-term memory     Blood pressure (!) 124/92, pulse 95, temperature 100.1 F (37.8 C), temperature source Oral, resp. rate 14, height 5\' 11"  (1.803 m), weight 84.8 kg (187 lb), SpO2 98 %.  Physical Exam   Vitals reviewed.  Constitutional: He appears well-developed and well-nourished.   HENT:   Head: Normocephalic and atraumatic.   Eyes: EOM are normal. Right eye exhibits no discharge. Left eye exhibits no discharge.   Neck: Normal range of motion. Neck supple. No thyromegaly present.   Cardiovascular: Regular rhythm and normal heart sounds.  +Tachycardia   Respiratory: Effort normal and breath sounds normal. No respiratory distress.   GI: Soft. Bowel sounds are normal. He exhibits no distension.  Musculoskeletal:  Left stump with tenderness Left hand edema  Neurological: He is alert.  Lethargic but arousable.   He provides his name, age and date of birth. Motor: B/l UE 5/5 proximal to distal RLE: 4/5 proximal to distal (pain inhibition) LLE: HF 4/5 (pain inhibition)   Skin: Skin is warm and dry.  BKA site is dressed with drain in place  Psychiatric: He has a normal mood and affect. His behavior is normal.         Lab Results Last 24 Hours  Imaging Results (Last 48 hours)                              Assessment/Plan:  Diagnosis: left BKA  Labs and images independently reviewed.  Records reviewed and summated above.  Clean amputation daily with soap and water  Monitor incision site for signs of infection or impending skin breakdown.  Staples to remain in place for 3-4 weeks  Stump shrinker, for edema control   Scar mobilization massaging to prevent soft tissue adherence Stump protector during therapies  Prevent flexion contractures by implementing the following:               Encourage prone lying for 20-30 mins per day BID to avoid hip flexion Contractures if medically appropriate;              Avoid pillow under knees when patient is lying in bed in order to prevent both knee and hip flexion contractures;              Avoid prolonged sitting  Post surgical pain control with oral medication  Phantom limb pain control with physical modalities including desensitization techniques (gentle self massage to the residual stump,hot packs if sensation intact, [place]) and mirror therapy, TENS. If ineffective, consider pharmacological treatment for neuropathic pain (e.g gabapentin, pregabalin, amytriptalyine, duloxetine).   When using wheelchair, patient should have knee on amputated side fully extended with board under  the seat cushion.  Avoid injury to contralateral side     1.Does the need for close, 24 hr/day medical supervision in concert with the patient's rehab needs make it unreasonable for this patient to be served in a less intensive setting? Yes    2.Co-Morbidities requiring supervision/potential complications: CAD (cont meds), diabetes mellitus (Monitor in accordance with exercise and adjust meds as necessary), HTN (monitor and provide prns in accordance with increased physical exertion and pain), post-op pain management (Biofeedback training with therapies to help reduce reliance on opiate pain medications, monitor pain control during therapies, and sedation at rest and titrate to maximum efficacy to ensure participation and gains in therapies), Acute on chronic anemia (transfuse if necessary to ensure appropriate perfusion for increased activity tolerance), Sepsis d/c IV Vanc/Zosyn when appropriate), leukocytosis (cont to monitor for signs and symptoms of infection, further workup if indicated)   3.Due to safety, skin/wound care, disease management, pain management and patient education, does the patient require 24 hr/day rehab nursing? Yes   4.Does the patient require coordinated care of a physician, rehab nurse, PT (1-2 hrs/day, 5 days/week) and OT (1-2 hrs/day, 5 days/week) to address physical and functional deficits in the context of the above medical diagnosis(es)? Yes Addressing deficits in the following areas: balance, endurance, locomotion, strength, transferring, bathing, dressing, toileting and psychosocial support   5.Can the patient actively participate in an intensive therapy program of at least 3 hrs of therapy per day at least 5 days per week? Yes   6.The potential for patient to make measurable gains while on inpatient rehab is excellent   7.Anticipated functional outcomes upon discharge from inpatient rehab are modified independent and supervision  with PT, modified  independent and supervision with OT, n/a with SLP.   8.Estimated rehab length of stay to reach the above functional goals is: 7-12 days.   9.Anticipated D/C setting: Home   10.Anticipated post D/C treatments: HH therapy and Home excercise program   11.Overall Rehab/Functional Prognosis: good  RECOMMENDATIONS:  This patient's condition is appropriate for continued rehabilitative care in the following setting: CIR  Patient has agreed to participate in recommended program. Yes  Note that insurance prior authorization may be required for reimbursement for recommended care.     Comment: Rehab Admissions Coordinator to follow up.        I have personally performed a face to face diagnostic evaluation, including, but not limited to relevant history and physical exam findings, of this patient and developed relevant assessment and plan.  Additionally, I have reviewed and concur with the physician assistant's documentation above.      Delice Lesch, MD, ABPMR  Lavon Paganini Angiulli, PA-C  09/07/2017                Revision History                                        Routing History

## 2017-09-10 ENCOUNTER — Inpatient Hospital Stay (HOSPITAL_COMMUNITY): Payer: BLUE CROSS/BLUE SHIELD | Admitting: Occupational Therapy

## 2017-09-10 ENCOUNTER — Inpatient Hospital Stay (HOSPITAL_COMMUNITY): Payer: BLUE CROSS/BLUE SHIELD | Admitting: Physical Therapy

## 2017-09-10 DIAGNOSIS — E1142 Type 2 diabetes mellitus with diabetic polyneuropathy: Secondary | ICD-10-CM

## 2017-09-10 DIAGNOSIS — M869 Osteomyelitis, unspecified: Secondary | ICD-10-CM

## 2017-09-10 DIAGNOSIS — D62 Acute posthemorrhagic anemia: Secondary | ICD-10-CM

## 2017-09-10 DIAGNOSIS — I10 Essential (primary) hypertension: Secondary | ICD-10-CM

## 2017-09-10 DIAGNOSIS — E1169 Type 2 diabetes mellitus with other specified complication: Secondary | ICD-10-CM

## 2017-09-10 DIAGNOSIS — N401 Enlarged prostate with lower urinary tract symptoms: Secondary | ICD-10-CM

## 2017-09-10 DIAGNOSIS — E1165 Type 2 diabetes mellitus with hyperglycemia: Secondary | ICD-10-CM

## 2017-09-10 DIAGNOSIS — Z89512 Acquired absence of left leg below knee: Secondary | ICD-10-CM

## 2017-09-10 DIAGNOSIS — R338 Other retention of urine: Secondary | ICD-10-CM

## 2017-09-10 LAB — CBC WITH DIFFERENTIAL/PLATELET
Abs Immature Granulocytes: 0.4 10*3/uL — ABNORMAL HIGH (ref 0.0–0.1)
Basophils Absolute: 0.1 10*3/uL (ref 0.0–0.1)
Basophils Relative: 1 %
Eosinophils Absolute: 0.4 10*3/uL (ref 0.0–0.7)
Eosinophils Relative: 4 %
HCT: 27.3 % — ABNORMAL LOW (ref 39.0–52.0)
Hemoglobin: 9 g/dL — ABNORMAL LOW (ref 13.0–17.0)
Immature Granulocytes: 4 %
Lymphocytes Relative: 17 %
Lymphs Abs: 1.6 10*3/uL (ref 0.7–4.0)
MCH: 28.4 pg (ref 26.0–34.0)
MCHC: 33 g/dL (ref 30.0–36.0)
MCV: 86.1 fL (ref 78.0–100.0)
Monocytes Absolute: 0.8 10*3/uL (ref 0.1–1.0)
Monocytes Relative: 8 %
Neutro Abs: 6.4 10*3/uL (ref 1.7–7.7)
Neutrophils Relative %: 66 %
Platelets: 288 10*3/uL (ref 150–400)
RBC: 3.17 MIL/uL — ABNORMAL LOW (ref 4.22–5.81)
RDW: 13.2 % (ref 11.5–15.5)
WBC: 9.6 10*3/uL (ref 4.0–10.5)

## 2017-09-10 LAB — COMPREHENSIVE METABOLIC PANEL
ALT: 11 U/L — ABNORMAL LOW (ref 17–63)
AST: 16 U/L (ref 15–41)
Albumin: 1.4 g/dL — ABNORMAL LOW (ref 3.5–5.0)
Alkaline Phosphatase: 119 U/L (ref 38–126)
Anion gap: 9 (ref 5–15)
BUN: 9 mg/dL (ref 6–20)
CO2: 28 mmol/L (ref 22–32)
Calcium: 8.7 mg/dL — ABNORMAL LOW (ref 8.9–10.3)
Chloride: 99 mmol/L — ABNORMAL LOW (ref 101–111)
Creatinine, Ser: 1.64 mg/dL — ABNORMAL HIGH (ref 0.61–1.24)
GFR calc Af Amer: 53 mL/min — ABNORMAL LOW (ref >60)
GFR calc non Af Amer: 46 mL/min — ABNORMAL LOW (ref >60)
Glucose, Bld: 134 mg/dL — ABNORMAL HIGH (ref 65–99)
Potassium: 3.7 mmol/L (ref 3.5–5.1)
Sodium: 136 mmol/L (ref 135–145)
Total Bilirubin: 0.4 mg/dL (ref 0.3–1.2)
Total Protein: 5.7 g/dL — ABNORMAL LOW (ref 6.5–8.1)

## 2017-09-10 LAB — GLUCOSE, CAPILLARY
Glucose-Capillary: 128 mg/dL — ABNORMAL HIGH (ref 65–99)
Glucose-Capillary: 157 mg/dL — ABNORMAL HIGH (ref 65–99)
Glucose-Capillary: 132 mg/dL — ABNORMAL HIGH (ref 65–99)
Glucose-Capillary: 171 mg/dL — ABNORMAL HIGH (ref 65–99)

## 2017-09-10 MED ORDER — TAMSULOSIN HCL 0.4 MG PO CAPS
0.8000 mg | ORAL_CAPSULE | Freq: Every day | ORAL | Status: DC
  Administered 2017-09-11 – 2017-09-17 (×7): 0.8 mg via ORAL
  Filled 2017-09-10 (×7): qty 2

## 2017-09-10 NOTE — Evaluation (Signed)
Occupational Therapy Assessment and Plan  Patient Details  Name: Chase Clark MRN: [id number] Date of Birth: Apr 16, 1965  OT Diagnosis: acute pain, lumbago (low back pain) and muscle weakness (generalized) Rehab Potential: Rehab Potential (ACUTE ONLY): Good ELOS: 7-10 days   Today's Date: 09/10/2017 OT Individual Time: 2778-2423 OT Individual Time Calculation (min): 60 min     Problem List:  Patient Active Problem List   Diagnosis Date Noted  . Poorly controlled type 2 diabetes mellitus with peripheral neuropathy ([place])   . Diabetic osteomyelitis ([place])   . Amputation of left lower extremity below knee ([place]) 09/09/2017  . Benign prostatic hyperplasia   . Hyperlipidemia   . Stage 3 chronic kidney disease ([place])   . Neuropathic pain   . Osteomyelitis of left foot ([place])   . Coronary artery disease involving native coronary artery of native heart without angina pectoris   . Benign essential HTN   . Diabetes mellitus type 2 in nonobese (HCC)   . Post-operative pain   . Acute blood loss anemia   . Anemia of chronic disease   . Leukocytosis   . Sepsis ([place]) 09/04/2017  . Pressure injury of skin 08/24/2017  . DKA (diabetic ketoacidoses) ([place]) 08/23/2017  . Cervical spondylosis with myelopathy and radiculopathy 05/07/2017  . ARF (acute renal failure) ([place]) 04/30/2017  . Upper respiratory infection 04/29/2017  . Preop examination 04/29/2017  . Fatty liver 04/04/2017  . Routine general medical examination at a health care facility 01/13/2017  . DDD (degenerative disc disease), cervical 01/13/2017  . PAD (peripheral artery disease) ([place]) 08/07/2016  . Ulcer of ankle ([place]) 08/07/2016  . Chronic diarrhea 05/17/2016  . Hyponatremia 05/17/2016  . Anemia 01/12/2014  . Disorder of ligament of ankle 07/31/2013  . Edema 06/28/2013  . 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 Obesity (BMI 30-39.9) 06/12/2013  . Pain in joint, shoulder region 06/12/2013  . Statin intolerance  06/10/2013  . Diabetic ulcer of foot with muscle involvement without evidence of necrosis ([place])   . DM (diabetes mellitus), type 2, uncontrolled w/ophthalmic complication ([place]) 53/61/4431  . Hyperlipidemia associated with type 2 diabetes mellitus ([place]) 07/17/2006  . GILBERT'S SYNDROME 07/17/2006  . Essential hypertension 07/17/2006  . Coronary atherosclerosis 07/17/2006  . OSTEOARTHRITIS 07/17/2006  . COUGH 07/17/2006    Past Medical History:  Past Medical History:  Diagnosis Date  . Arthritis   . Asthma    as child  . Coronary artery disease    DES DIAG1 11/20/09 [facility])  . Diabetes mellitus without complication ([place])   . Diverticulosis   . Edema, lower extremity   . Fatty liver   . GERD (gastroesophageal reflux disease)   . Gilbert's disease   . Glaucoma   . Heart disease 2011   Patient has stent for 80% blockage  . Hyperlipidemia   . Hypertension   . Neuropathy   . Seasonal allergies   . Shoulder pain   . Ulcer of foot ([place]) 08.06.2014   DIABETIC ULCERATIONS ASSOCIATED WITH IRRITATION LATERAL ANKLE LEFT GREATER THAN RIGHT WITH MILD CELLULITIS   Past Surgical History:  Past Surgical History:  Procedure Laterality Date  . ABDOMINAL AORTOGRAM N/A 05/20/2016   Procedure: Abdominal Aortogram possible intervention;  Surgeon: Katha Cabal, MD;  Location: [facility];  Service: Cardiovascular;  Laterality: N/A;  . AMPUTATION Left 09/05/2017   Procedure: AMPUTATION BELOW KNEE;  Surgeon: Tania Ade, MD;  Location: [place];  Service: Orthopedics;  Laterality: Left;  . ANTERIOR CERVICAL  DECOMP/DISCECTOMY FUSION N/A 05/07/2017   Procedure: ANTERIOR CERVICAL DECOMPRESSION/DISCECTOMY Luevenia Maxin PROSTHESIS,PLATE/SCREWS CERVICAL FIVE - CERVICAL SIX;  Surgeon: Newman Pies, MD;  Location: [place];  Service: Neurosurgery;  Laterality: N/A;  . CARDIAC CATHETERIZATION    . CATARACT EXTRACTION W/ INTRAOCULAR LENS IMPLANT    . CATARACT EXTRACTION W/PHACO Left 02/26/2017    Procedure: CATARACT EXTRACTION PHACO AND INTRAOCULAR LENS PLACEMENT (IOC);  Surgeon: Eulogio Bear, MD;  Location: [facility];  Service: Ophthalmology;  Laterality: Left;  Lot # [id number] H [place]: 00:25.3 AP%: 6.2 CDE: 1.59   . CHOLECYSTECTOMY N/A   . CORONARY ANGIOPLASTY WITH STENT PLACEMENT  2007   1st diagonal  . EPIBLEPHERON REPAIR WITH TEAR DUCT PROBING    . EYE SURGERY Right sept 29n2015   cataract extraction  . INSERTION EXPRESS TUBE SHUNT Right 09/06/2015   Procedure: INSERTION AHMED TUBE SHUNT with tutoplast allograft;  Surgeon: Eulogio Bear, MD;  Location: [facility];  Service: Ophthalmology;  Laterality: Right;  Marland Kitchen VASECTOMY      Assessment & Plan Clinical Impression: Patient is a 52 y.o. right-handed male with history of CAD with PCI 2007, diabetes mellitus, CKD stage III, hypertension, chronic left foot ulcer followed at the wound center in [facility] with frequent debridements. Presented 09/04/2017 with generalized weakness and near syncopal episode with fall. Patient with recent admission of proximal 1 week ago for DKA at [facility] long hospital discharge to home returning the following day with weakness. Noted complaints of back pain. Chest x-ray negative. 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 MRI lumbar spine unremarkable. Noted WBC 16,700, lactic acid 4.4. Suspect sepsis related to left foot wound. 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 [name]. Hospital course pain management. Subcutaneous heparin for DVT prophylaxis. Acute on chronic anemia 9.0and monitored. Initially maintained on vancomycin and Zosyn for sepsis protocol with cultures thus far negativeand changed to doxycycline x4 more days and stop. Physical occupational therapy evaluations completed with recommendations of physical medicine rehab consult.  Patient was admitted for a comprehensive rehab program.    Patient transferred to CIR on 09/09/2017 .    Patient currently requires  min-mod assist with basic self-care skills secondary to muscle weakness and decreased standing balance, decreased postural control and pain.  Prior to hospitalization, patient could complete ADLs with modified independent .  Patient will benefit from skilled intervention to increase independence with basic self-care skills prior to discharge home with care partner.  Anticipate patient will require intermittent supervision and follow up home health.  OT - End of Session Activity Tolerance: Tolerates 30+ min activity with multiple rests Endurance Deficit: Yes Endurance Deficit Description: mostly due to pain OT Assessment Rehab Potential (ACUTE ONLY): Good OT Patient demonstrates impairments in the following area(s): Balance;Endurance;Motor;Pain;Safety;Skin Integrity OT Basic ADL's Functional Problem(s): Grooming;Bathing;Dressing;Toileting OT Advanced ADL's Functional Problem(s): Simple Meal Preparation OT Transfers Functional Problem(s): Toilet;Tub/Shower OT Additional Impairment(s): None OT Plan OT Intensity: Minimum of 1-2 x/day, 45 to 90 minutes OT Frequency: 5 out of 7 days OT Duration/Estimated Length of Stay: 7-10 days OT Treatment/Interventions: Balance/vestibular training;Community reintegration;Discharge planning;Disease mangement/prevention;DME/adaptive equipment instruction;Functional mobility training;Pain management;Patient/family education;Psychosocial support;Self Care/advanced ADL retraining;Skin care/wound managment;Therapeutic Activities;Therapeutic Exercise;UE/LE Strength taining/ROM;UE/LE Coordination activities;Wheelchair propulsion/positioning OT Basic Self-Care Anticipated Outcome(s): Supervision OT Toileting Anticipated Outcome(s): Supervision OT Bathroom Transfers Anticipated Outcome(s): Supervision OT Recommendation Patient destination: Home Follow Up Recommendations: Home health OT Equipment Recommended: To be determined   Skilled Therapeutic Intervention OT  eval completed with discussion of rehab process, OT purpose, POC, ELOS,  and goals.  ADL assessment completed at sit > stand level at sink.  Pt completed squat pivot transfer bed > w/c with min assist.  Bathing setup at sink with pt requiring min assist when standing to wash buttocks followed by assistance with LB dressing due to pain in buttocks (due to ?broken coccyx) with bending and after prolonged sitting.  Provided pt with Roho w/c cushion to attempt to provide increased padding/pressure relief for bottom when in upright sitting.  Pt returned to bed at end of session for pain relief, squat pivot min assist.  OT Evaluation Precautions/Restrictions  Precautions Precautions: Fall Restrictions Weight Bearing Restrictions: Yes LLE Weight Bearing: Non weight bearing Pain Pain Assessment Pain Scale: 0-10 Pain Score: 7  Pain Type: Acute pain Pain Location: Buttocks Pain Descriptors / Indicators: Aching Pain Frequency: Constant Pain Onset: On-going Patients Stated Pain Goal: 3 Pain Intervention(s): Medication (See eMAR) Home Living/Prior Functioning Home Living Available Help at Discharge: Family, Available 24 hours/day(24/7) Type of Home: House Home Access: Stairs to enter [employer] of Steps: 2 in front and 4 into garage.  No rails on front steps, one on the Rt at garage stairs Entrance Stairs-Rails: Right Home Layout: Two level, Able to live on main level with bedroom/bathroom Bathroom Shower/Tub: Gaffer, Door [employer] Toilet: Standard Bathroom Accessibility: Yes Additional Comments: has adjustable tempurpedic  Lives With: Spouse IADL History Homemaking Responsibilities: Yes Meal Prep Responsibility: Primary Laundry Responsibility: Primary Cleaning Responsibility: Primary Prior Function Level of Independence: Independent with basic ADLs, Independent with homemaking with ambulation  Able to Take Stairs?: Yes Driving: Yes 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  See Function Navigator Vision Baseline Vision/History: (P) Wears glasses Wears Glasses: (P) Reading only Patient Visual Report: (P) No change from baseline Vision Assessment?: (P) No apparent visual deficits Perception  Perception: (P) Within Functional Limits Praxis Praxis: (P) Intact Cognition Overall Cognitive Status: Within Functional Limits for tasks assessed Arousal/Alertness: Awake/alert Orientation Level: Person;Place;Situation Person: Oriented Place: Oriented Situation: Oriented Year: 2019 Month: June Day of Week: Correct Immediate Memory Recall: Sock;Blue;Bed Memory Recall: Blue Memory Recall Blue: Without Cue Attention: Selective Selective Attention: Appears intact Awareness: Appears intact Problem Solving: Appears intact Safety/Judgment: Appears intact Sensation Sensation Light Touch: Appears Intact(BUE) Proprioception: Appears Intact(BUE) Coordination Fine Motor Movements are Fluid and Coordinated: Yes Finger Nose Finger Test: [facility] Extremity/Trunk Assessment RUE Assessment RUE Assessment: Within Functional Limits LUE Assessment LUE Assessment: Within Functional Limits   See Function Navigator for Current Functional Status.   Refer to Care Plan for Long Term Goals  Recommendations for other services: None    Discharge Criteria: Patient will be discharged from OT if patient refuses treatment 3 consecutive times without medical reason, if treatment goals not met, if there is a change in medical status, if patient makes no progress towards goals or if patient is discharged from hospital.  The above assessment, treatment plan, treatment alternatives and goals were discussed and mutually agreed upon: by patient  Wallace Gappa, [facility] 09/10/2017, 10:08 AM

## 2017-09-10 NOTE — Progress Notes (Signed)
[place] PHYSICAL MEDICINE & REHABILITATION     PROGRESS NOTE  Subjective/Complaints:  Patient seen lying in bed this morning. He states he did not sleep that well overnight. He states he is ready to begin therapies today.  ROS: denies CP, SOB, nausea, vomiting, diarrhea.  Objective: Vital Signs: Blood pressure (!) 141/94, pulse 91, temperature 99.1 F (37.3 C), temperature source Oral, resp. rate 19, height 5\' 10"  (1.778 m), weight 84.9 kg (187 lb 2.7 oz), SpO2 98 %. Dg Chest Port 1 View  Result Date: 09/08/2017 CLINICAL DATA:  SOB EXAM: PORTABLE CHEST 1 VIEW COMPARISON:  09/07/2017 FINDINGS: The heart size and mediastinal contours are within normal limits. Both lungs are clear. The visualized skeletal structures are unremarkable. IMPRESSION: No active disease. Electronically Signed   By: Kathreen Devoid   On: 09/08/2017 13:57   Recent Labs    09/09/17 0443 09/10/17 0556  WBC 9.5 9.6  HGB 9.1* 9.0*  HCT 28.0* 27.3*  PLT 290 288   Recent Labs    09/09/17 0443 09/10/17 0556  NA 134* 136  K 4.0 3.7  CL 99* 99*  GLUCOSE 249* 134*  BUN 10 9  CREATININE 1.70* 1.64*  CALCIUM 8.4* 8.7*   CBG (last 3)  Recent Labs    09/09/17 1715 09/09/17 2053 09/10/17 0651  GLUCAP 289* 169* 128*    Wt Readings from Last 3 Encounters:  09/09/17 84.9 kg (187 lb 2.7 oz)  09/04/17 84.8 kg (187 lb)  08/23/17 84.9 kg (187 lb 2.7 oz)    Physical Exam:  BP (!) 141/94 (BP Location: Right Arm)   Pulse 91   Temp 99.1 F (37.3 C) (Oral)   Resp 19   Ht 5\' 10"  (1.778 m)   Wt 84.9 kg (187 lb 2.7 oz)   SpO2 98%   BMI 26.86 kg/m  Constitutional: He appearswell-developedand well-nourished. NAD. HENT: Normocephalicand atraumatic.  Eyes:EOMare normal. No discharge.  Cardiovascular:Regular rate and rhythm. No JVD. Respiratory:Effort normaland breath sounds normal.   XN:ATFTD sounds are normal. He exhibitsno distension.  Musculoskeletal: Left stump with tenderness and mild  edema Neurological: He isalert.  Alert and oriented. Motor: B/l UE 5/5 proximal to distal RLE: 4+/5 proximal to distal LLE: HF 4+/5 (pain inhibition) Skin: Skin iswarmand dry. BKA site with sutures C/D/I Psychiatric: He has anormal mood and affect. Hisbehavior is normal.  Assessment/Plan: 1. Functional deficits secondary to Left BKA which require 3+ hours per day of interdisciplinary therapy in a comprehensive inpatient rehab setting. Physiatrist is providing close team supervision and 24 hour management of active medical problems listed below. Physiatrist and rehab team continue to assess barriers to discharge/monitor patient progress toward functional and medical goals.  Function:  Bathing Bathing position      Bathing parts      Bathing assist        Upper Body Dressing/Undressing Upper body dressing                    Upper body assist        Lower Body Dressing/Undressing Lower body dressing                                  Lower body assist        Toileting Toileting          Toileting assist     Transfers Chair/bed transfer  Locomotion Ambulation           Wheelchair          Cognition Comprehension Comprehension assist level: Follows basic conversation/direction with no assist  Expression Expression assist level: Expresses basic needs/ideas: With no assist  Social Interaction Social Interaction assist level: Interacts appropriately with others - No medications needed.  Problem Solving Problem solving assist level: Solves basic 90% of the time/requires cueing < 10% of the time  Memory Memory assist level: Complete Independence: No helper    Medical Problem List and Plan: 1.  Decreased functional mobility secondary to sepsis secondary to left diabetic foot ulcer status post left BKA 09/05/2017  Begin CIR 2.  DVT Prophylaxis/Anticoagulation: Subcutaneous heparin.  Monitor for any bleeding  episodes 3. Pain Management: Neurontin 600 mg twice daily, Lidoderm patch, Ultram and oxycodone as needed 4. Mood: Provide emotional support 5. Neuropsych: This patient is capable of making decisions on his own behalf. 6. Skin/Wound Care: Routine skin checks 7. Fluids/Electrolytes/Nutrition: Routine in and outs  8.  Acute blood loss anemia.     Hemoglobin 9.0 on 5/30 9.  ID/sepsis.    Cultures NGTD.    Vancomycin and Zosyn changed to doxycycline 09/08/2017 3 more days and stopped 10.  CKD stage III.     Creatinine 1.64 on 5/30 11.  Diabetes mellitus with peripheral neuropathy as well as history of DKA.  Hemoglobin A1c 15.3.  Lantus insulin 50 units daily.  Check blood sugars before meals and at bedtime.  Diabetic teaching   Monitor with increased mobility 12.  CAD with PCI stenting 2007.  Continue aspirin.  No chest pain or shortness of breath 13.  Hypertension.  Norvasc 10 mg daily   Monitor with increased mobility 14.  Hyperlipidemia.  Crestor 15.  BPH.  Flomax 0.4 mg daily, increased on 5/30.     PVRs suggesting urinary retention  LOS (Days) 1 A FACE TO FACE EVALUATION WAS PERFORMED  Ankit Lorie Phenix 09/10/2017 9:06 AM

## 2017-09-10 NOTE — Discharge Instructions (Signed)
Inpatient Rehab Discharge Instructions  Chase Clark Discharge date and time: No discharge date for patient encounter.   Activities/Precautions/ Functional Status: Activity: activity as tolerated Diet: diabetic diet Wound Care: keep wound clean and dry Functional status:  ___ No restrictions     ___ Walk up steps independently ___ 24/7 supervision/assistance   ___ Walk up steps with assistance ___ Intermittent supervision/assistance  ___ Bathe/dress independently ___ Walk with walker     _x__ Bathe/dress with assistance ___ Walk Independently    ___ Shower independently ___ Walk with assistance    ___ Shower with assistance ___ No alcohol     ___ Return to work/school ________  Special Instructions:    My questions have been answered and I understand these instructions. I will adhere to these goals and the provided educational materials after my discharge from the hospital.  Patient/Caregiver Signature _______________________________ Date __________  Clinician Signature _______________________________________ Date __________  Please bring this form and your medication list with you to all your follow-up doctor's appointments.

## 2017-09-10 NOTE — Progress Notes (Signed)
Occupational Therapy Session Note  Patient Details  Name: Chase Clark MRN: [id number] Date of Birth: 1966-04-14  Today's Date: 09/10/2017 OT Individual Time: 1415-1530 OT Individual Time Calculation (min): 75 min   Short Term Goals: Week 1:  OT Short Term Goal 1 (Week 1): STG = LTGs due to ELOS  Skilled Therapeutic Interventions/Progress Updates:    Pt greeted side-lying in bed and agreeable to OT. OT exchanged wc and built up L residual limb rest with towel and coband. Squat-pivot bed>wc with min guard A. OT educated pt on wc parts and set-up and had pt practice placing leg rests with guided A. Pt was only able to propel wc short distances before he had to stop and weight shift off of his coccyx. UB there-ex with [employer] arm bike on work load 3. Pt tolerated 2 minutes at longest bout before needing rest break 2/2 coccyx pain- pt completed a total of 5 mins. Worked on sit<>stands in parallel bars with close supervision/min guard A. LLE there-ex in standing including hip flex/ext with knee extended and hip abduction 10x3 sets. Discussed OT goals, POC, and ELOS with pt. Pt returned to room and completed squat-pivot back to bed on L side. Pt left in side-lying with bed alarm on nursing present.   Therapy Documentation Precautions:  Precautions Precautions: Fall Precaution Comments: fx coccyx Restrictions Weight Bearing Restrictions: Yes LLE Weight Bearing: Non weight bearing Pain: Pain Assessment Pain Scale: 0-10 Pain Score: 5  Pain Type: Acute pain Pain Location: Coccyx Pain Orientation: Lower Pain Descriptors / Indicators: Aching Pain Intervention(s): Repositioned  See Function Navigator for Current Functional Status.  Therapy/Group: Individual Therapy  Valma Cava 09/10/2017, 3:31 PM

## 2017-09-10 NOTE — Progress Notes (Signed)
Patient noted to be urinating multiple with small amounts emptying. Patient was bladder scanned for 787 ml @ 2200. Patient was assisted to restroom to try to void with successful attempt, but small amount. Bladder scanned for 635 ml @ 2307. Chase Chew PA contacted via telephone and new orders received and carried out. Patient was straight catheterized per order with no c/o pain/discomfort. Patient educated on possible causes. Will continue to monitor.

## 2017-09-11 ENCOUNTER — Inpatient Hospital Stay (HOSPITAL_COMMUNITY): Payer: BLUE CROSS/BLUE SHIELD | Admitting: Occupational Therapy

## 2017-09-11 ENCOUNTER — Inpatient Hospital Stay (HOSPITAL_COMMUNITY): Payer: BLUE CROSS/BLUE SHIELD | Admitting: Physical Therapy

## 2017-09-11 LAB — GLUCOSE, CAPILLARY
Glucose-Capillary: 148 mg/dL — ABNORMAL HIGH (ref 65–99)
Glucose-Capillary: 127 mg/dL — ABNORMAL HIGH (ref 65–99)
Glucose-Capillary: 62 mg/dL — ABNORMAL LOW (ref 65–99)
Glucose-Capillary: 60 mg/dL — ABNORMAL LOW (ref 65–99)
Glucose-Capillary: 100 mg/dL — ABNORMAL HIGH (ref 65–99)
Glucose-Capillary: 152 mg/dL — ABNORMAL HIGH (ref 65–99)

## 2017-09-11 NOTE — Progress Notes (Signed)
[facility] PHYSICAL MEDICINE & REHABILITATION     PROGRESS NOTE  Subjective/Complaints:  Patient seen lying in bed this morning. He states he slept better overnight. He states he is sore from his first in therapies. He notes that he was able to urinate a little bit as well.  ROS: denies CP, SOB, nausea, vomiting, diarrhea.  Objective: Vital Signs: Blood pressure (!) 151/94, pulse 87, temperature 99.6 F (37.6 C), temperature source Oral, resp. rate 18, height 5\' 10"  (1.778 m), weight 84.9 kg (187 lb 2.7 oz), SpO2 98 %. No results found. Recent Labs    09/09/17 0443 09/10/17 0556  WBC 9.5 9.6  HGB 9.1* 9.0*  HCT 28.0* 27.3*  PLT 290 288   Recent Labs    09/09/17 0443 09/10/17 0556  NA 134* 136  K 4.0 3.7  CL 99* 99*  GLUCOSE 249* 134*  BUN 10 9  CREATININE 1.70* 1.64*  CALCIUM 8.4* 8.7*   CBG (last 3)  Recent Labs    09/10/17 1707 09/10/17 2117 09/11/17 0650  GLUCAP 132* 171* 148*    Wt Readings from Last 3 Encounters:  09/09/17 84.9 kg (187 lb 2.7 oz)  09/04/17 84.8 kg (187 lb)  08/23/17 84.9 kg (187 lb 2.7 oz)    Physical Exam:  BP (!) 151/94   Pulse 87   Temp 99.6 F (37.6 C) (Oral)   Resp 18   Ht 5\' 10"  (1.778 m)   Wt 84.9 kg (187 lb 2.7 oz)   SpO2 98%   BMI 26.86 kg/m  Constitutional: He appearswell-developedand well-nourished. NAD. HENT: Normocephalicand atraumatic.  Eyes:EOMare normal. No discharge.  Cardiovascular:RRR. No JVD. Respiratory:Effort normaland breath sounds normal.   IO:NGEXB sounds are normal. He exhibitsno distension.  Musculoskeletal: Left stump with tenderness and mild edema Neurological: He isalert.  Alert and oriented. Motor: B/l UE 5/5 proximal to distal RLE: 4+/5 proximal to distal LLE: HF 4+/5 (pain inhibition, stable) Skin: Skin iswarmand dry. BKA site with sutures C/D/I Psychiatric: He has anormal mood and affect. Hisbehavior is normal.  Assessment/Plan: 1. Functional deficits secondary to  Left BKA which require 3+ hours per day of interdisciplinary therapy in a comprehensive inpatient rehab setting. Physiatrist is providing close team supervision and 24 hour management of active medical problems listed below. Physiatrist and rehab team continue to assess barriers to discharge/monitor patient progress toward functional and medical goals.  Function:  Bathing Bathing position   Position: Wheelchair/chair at sink  Bathing parts Body parts bathed by patient: Right arm, Left arm, Chest, Abdomen, Front perineal area, Buttocks, Right upper leg, Left upper leg Body parts bathed by helper: Back  Bathing assist Assist Level: Touching or steadying assistance(Pt > 75%)      Upper Body Dressing/Undressing Upper body dressing   What is the patient wearing?: Pull over shirt/dress     Pull over shirt/dress - Perfomed by patient: Thread/unthread right sleeve, Thread/unthread left sleeve, Put head through opening, Pull shirt over trunk          Upper body assist Assist Level: Touching or steadying assistance(Pt > 75%)      Lower Body Dressing/Undressing Lower body dressing   What is the patient wearing?: Underwear, Pants, Non-skid slipper socks Underwear - Performed by patient: Thread/unthread left underwear leg, Pull underwear up/down Underwear - Performed by helper: Thread/unthread right underwear leg Pants- Performed by patient: Pull pants up/down Pants- Performed by helper: Thread/unthread right pants leg, Thread/unthread left pants leg   Non-skid slipper socks- Performed by helper: Don/doff right  sock                  Lower body assist Assist for lower body dressing: (Max assist)      Toileting Toileting   Toileting steps completed by patient: Adjust clothing prior to toileting, Adjust clothing after toileting Toileting steps completed by helper: Performs perineal hygiene Toileting Assistive Devices: Grab bar or rail  Toileting assist Assist level: Touching or  steadying assistance (Pt.75%)   Transfers Chair/bed transfer   Chair/bed transfer method: Squat pivot Chair/bed transfer assist level: Touching or steadying assistance (Pt > 75%) Chair/bed transfer assistive device: Nurse, adult          Cognition Comprehension Comprehension assist level: Follows basic conversation/direction with no assist  Expression Expression assist level: Expresses basic needs/ideas: With no assist  Social Interaction Social Interaction assist level: Interacts appropriately with others - No medications needed.  Problem Solving Problem solving assist level: Solves basic 90% of the time/requires cueing < 10% of the time  Memory Memory assist level: Recognizes or recalls 75 - 89% of the time/requires cueing 10 - 24% of the time    Medical Problem List and Plan: 1.  Decreased functional mobility secondary to sepsis secondary to left diabetic foot ulcer status post left BKA 09/05/2017  Continue CIR 2.  DVT Prophylaxis/Anticoagulation: Subcutaneous heparin.  Monitor for any bleeding episodes 3. Pain Management: Neurontin 600 mg twice daily, Lidoderm patch, Ultram and oxycodone as needed 4. Mood: Provide emotional support 5. Neuropsych: This patient is capable of making decisions on his own behalf. 6. Skin/Wound Care: Routine skin checks 7. Fluids/Electrolytes/Nutrition: Routine in and outs  8.  Acute blood loss anemia.     Hemoglobin 9.0 on 5/30 9.  ID/sepsis.    Cultures NGTD on 5/31.    Vancomycin and Zosyn changed to doxycycline 09/08/2017 2 more days and stopped 10.  CKD stage III.     Creatinine 1.64 on 5/30 11.  Diabetes mellitus with peripheral neuropathy as well as history of DKA.  Hemoglobin A1c 15.3.  Lantus insulin 50 units daily.  Check blood sugars before meals and at bedtime.  Diabetic teaching   Remains elevated, but? Stabilizing 12.  CAD with PCI stenting 2007.  Continue aspirin.  No chest pain or  shortness of breath 13.  Hypertension.  Norvasc 10 mg daily   Elevated overnight, will consider additional medications tomorrow 14.  Hyperlipidemia.  Crestor 15.  BPH.  Flomax 0.4 mg daily, increased on 5/30.     PVRs suggesting urinary retention   Will consider additional medications tomorrow  LOS (Days) 2 A FACE TO FACE EVALUATION WAS PERFORMED  Ankit Lorie Phenix 09/11/2017 9:17 AM

## 2017-09-11 NOTE — Progress Notes (Signed)
Occupational Therapy Session Note  Patient Details  Name: Chase Clark MRN: [id number] Date of Birth: [birth year]  Today's Date: 09/11/2017 OT Individual Time: 1305-1400 OT Individual Time Calculation (min): 55 min    Short Term Goals: Week 1:  OT Short Term Goal 1 (Week 1): STG = LTGs due to ELOS  Skilled Therapeutic Interventions/Progress Updates:    Treatment session with focus on education regarding residual limb care with wrapping, limb inspection, and desensitization.  Pt reports pain in tailbone and requesting to not do any sitting activities.  Provided pt with First Steps publication and discussed typical recovery pattern as well as coping mechanisms.  Provided pt with inspection mirror and educated on limb inspection during healing as well as once wearing prosthesis.  Provided pt with handout about limb wrapping and discussed proper technique with plan to begin having pt complete limb wrapping.  Discussed phantom pain and pain as well as techniques for desensitization with massage and tapping.  Pt engaged in discussion and appreciative of education and resources.  Discussed DME recommendations and plan to continue to assess for appropriate needs.  Pt left in sidelying in bed with all needs in reach.  Therapy Documentation Precautions:  Precautions Precautions: Fall Precaution Comments: fx coccyx Restrictions Weight Bearing Restrictions: Yes LLE Weight Bearing: Non weight bearing Pain: Pain Assessment Pain Scale: 0-10 Pain Score: 8  Pain Type: Acute pain Pain Location: Buttocks Pain Descriptors / Indicators: Aching Pain Frequency: Constant Pain Onset: On-going Patients Stated Pain Goal: 2 Pain Intervention(s): Medication (See eMAR)  See Function Navigator for Current Functional Status.   Therapy/Group: Individual Therapy  Simonne Come 09/11/2017, 4:07 PM

## 2017-09-11 NOTE — Care Management (Signed)
[facility] Individual Statement of Services  Patient Name:  Chase Clark  Date:  09/11/2017  Welcome to the [facility].  Our goal is to provide you with an individualized program based on your diagnosis and situation, designed to meet your specific needs.  With this comprehensive rehabilitation program, you will be expected to participate in at least 3 hours of rehabilitation therapies Monday-Friday, with modified therapy programming on the weekends.  Your rehabilitation program will include the following services:  Physical Therapy (PT), Occupational Therapy (OT), 24 hour per day rehabilitation nursing, Therapeutic Recreaction (TR), Neuropsychology, Case Management (Social Worker), Rehabilitation Medicine, Nutrition Services and Pharmacy Services  Weekly team conferences will be held on Wednesdays to discuss your progress.  Your Social Worker will talk with you frequently to get your input and to update you on team discussions.  Team conferences with you and your family in attendance may also be held.  Expected length of stay: 7-10 days    Overall anticipated outcome: modified independent  Depending on your progress and recovery, your program may change. Your Social Worker will coordinate services and will keep you informed of any changes. Your Social Worker's name and contact numbers are listed  below.  The following services may also be recommended but are not provided by the [facility] will be made to provide these services after discharge if needed.  Arrangements include referral to agencies that provide these services.  Your insurance has been verified to be:  [employer] Your primary doctor is:  [name]  Pertinent information will be shared with your doctor and your  insurance company.  Social Worker:  [place], [place] or ([phone]   Information discussed with and copy given to patient by: Lennart Pall, 09/11/2017, 11:07 AM

## 2017-09-11 NOTE — Progress Notes (Signed)
Social Work  Social Work Assessment and Plan  Patient Details  Name: Chase Clark MRN: [id number] Date of Birth: Apr 19, 1965  Today's Date: 09/11/2017  Problem List:  Patient Active Problem List   Diagnosis Date Noted  . Poorly controlled type 2 diabetes mellitus with peripheral neuropathy ([place])   . Diabetic osteomyelitis ([place])   . Amputation of left lower extremity below knee ([place]) 09/09/2017  . Benign prostatic hyperplasia   . Hyperlipidemia   . Stage 3 chronic kidney disease ([facility])   . Neuropathic pain   . Osteomyelitis of left foot ([place])   . Coronary artery disease involving native coronary artery of native heart without angina pectoris   . Benign essential HTN   . Diabetes mellitus type 2 in nonobese ([facility])   . Post-operative pain   . Acute blood loss anemia   . Anemia of chronic disease   . Leukocytosis   . Sepsis ([place]) 09/04/2017  . Pressure injury of skin 08/24/2017  . DKA (diabetic ketoacidoses) ([place]) 08/23/2017  . Cervical spondylosis with myelopathy and radiculopathy 05/07/2017  . ARF (acute renal failure) ([place]) 04/30/2017  . Upper respiratory infection 04/29/2017  . Preop examination 04/29/2017  . Fatty liver 04/04/2017  . Routine general medical examination at a health care facility 01/13/2017  . DDD (degenerative disc disease), cervical 01/13/2017  . PAD (peripheral artery disease) ([place]) 08/07/2016  . Ulcer of ankle ([place]) 08/07/2016  . Chronic diarrhea 05/17/2016  . Hyponatremia 05/17/2016  . Anemia 01/12/2014  . Disorder of ligament of ankle 07/31/2013  . Edema 06/28/2013  . 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 Obesity (BMI 30-39.9) 06/12/2013  . Pain in joint, shoulder region 06/12/2013  . Statin intolerance 06/10/2013  . Diabetic ulcer of foot with muscle involvement without evidence of necrosis ([place])   . DM (diabetes mellitus), type 2, uncontrolled w/ophthalmic complication ([place]) 13/24/4010  . Hyperlipidemia associated with type 2  diabetes mellitus ([place]) 07/17/2006  . GILBERT'S SYNDROME 07/17/2006  . Essential hypertension 07/17/2006  . Coronary atherosclerosis 07/17/2006  . OSTEOARTHRITIS 07/17/2006  . COUGH 07/17/2006   Past Medical History:  Past Medical History:  Diagnosis Date  . Arthritis   . Asthma    as child  . Coronary artery disease    DES DIAG1 11/20/09 [facility])  . Diabetes mellitus without complication ([place])   . Diverticulosis   . Edema, lower extremity   . Fatty liver   . GERD (gastroesophageal reflux disease)   . Gilbert's disease   . Glaucoma   . Heart disease 2011   Patient has stent for 80% blockage  . Hyperlipidemia   . Hypertension   . Neuropathy   . Seasonal allergies   . Shoulder pain   . Ulcer of foot ([place]) 08.06.2014   DIABETIC ULCERATIONS ASSOCIATED WITH IRRITATION LATERAL ANKLE LEFT GREATER THAN RIGHT WITH MILD CELLULITIS   Past Surgical History:  Past Surgical History:  Procedure Laterality Date  . ABDOMINAL AORTOGRAM N/A 05/20/2016   Procedure: Abdominal Aortogram possible intervention;  Surgeon: Katha Cabal, MD;  Location: [facility];  Service: Cardiovascular;  Laterality: N/A;  . AMPUTATION Left 09/05/2017   Procedure: AMPUTATION BELOW KNEE;  Surgeon: Tania Ade, MD;  Location: [facility];  Service: Orthopedics;  Laterality: Left;  . ANTERIOR CERVICAL DECOMP/DISCECTOMY FUSION N/A 05/07/2017   Procedure: ANTERIOR CERVICAL DECOMPRESSION/DISCECTOMY Luevenia Maxin PROSTHESIS,PLATE/SCREWS CERVICAL FIVE - CERVICAL SIX;  Surgeon: Newman Pies, MD;  Location: [facility];  Service: Neurosurgery;  Laterality: N/A;  . CARDIAC  CATHETERIZATION    . CATARACT EXTRACTION W/ INTRAOCULAR LENS IMPLANT    . CATARACT EXTRACTION W/PHACO Left 02/26/2017   Procedure: CATARACT EXTRACTION PHACO AND INTRAOCULAR LENS PLACEMENT (IOC);  Surgeon: Eulogio Bear, MD;  Location: [facility];  Service: Ophthalmology;  Laterality: Left;  Lot # [id number] H [place]: 00:25.3 AP%: 6.2 CDE: 1.59   .  CHOLECYSTECTOMY N/A   . CORONARY ANGIOPLASTY WITH STENT PLACEMENT  2007   1st diagonal  . EPIBLEPHERON REPAIR WITH TEAR DUCT PROBING    . EYE SURGERY Right sept 29n2015   cataract extraction  . INSERTION EXPRESS TUBE SHUNT Right 09/06/2015   Procedure: INSERTION AHMED TUBE SHUNT with tutoplast allograft;  Surgeon: Eulogio Bear, MD;  Location: [facility];  Service: Ophthalmology;  Laterality: Right;  Marland Kitchen VASECTOMY     Social History:  reports that he quit smoking about 9 years ago. His smoking use included cigarettes. He has never used smokeless tobacco. He reports that he drinks alcohol. He reports that he does not use drugs.  Family / Support Systems Marital Status: Married How Long?: 17 yrs Patient Roles: Spouse, Parent Spouse/Significant Other: wife, Hart Haas @ (C) [phone] Children: PT has two adult children from his first marriage.  Ages 40 and 52 yrs old and living in [place] and [place] Anticipated Caregiver: wife, two daughters and son in law Ability/Limitations of Caregiver: wife is a Marine scientist works days at a [employer] Availability: 24/7(initially can cover 24/7) Family Dynamics: Pt describes wife and family as very supportive.  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 Return to Work Plans: Pt states, "I'm gonna have to find another line of work.  I don't think I'll be climbing up on any houses again with a prosthesis." Legal Hisotry/Current Legal Issues: None Guardian/Conservator: None - per MD, pt is capable of making decisions on his own behalf.   Abuse/Neglect Abuse/Neglect Assessment Can Be Completed: Yes Physical Abuse: Denies Verbal Abuse: Denies Sexual Abuse: Denies Exploitation of patient/patient's  resources: Denies Self-Neglect: Denies  Emotional Status Pt's affect, behavior adn adjustment status: Pt very pleasant and talkative.  He speaks easily and openly about his 1 yr + attempts to "save my leg" via wound care clinic.  States the likelihood of needing BKA has been known to him the entire time and that he feels he was emotionally ready to go through this.  He describes himself as "optimitstic" regarding "moving forward and getting my prosthesis."  Denies any significant emotional distress, however, will monitor and will refer for neuropsychology if indicated. Recent Psychosocial Issues: Pt notes the ongoing wound care "has been draining" and also notes he also lost his mother in May and has been helping other family members care for his step-father who suffers with Lewy Body dz. Pyschiatric History: None Substance Abuse History: none  Patient / Family Perceptions, Expectations & Goals Pt/Family understanding of illness & functional limitations: Pt and family with very good understanding of his DM, wound care management, infection and ultimate need for BKA. Premorbid pt/family roles/activities: Pt was independent PTA  Anticipated changes in roles/activities/participation: Little change anticipated if pt able to reach mod ind goals. Pt/family expectations/goals: "I hope I can work through this pain in my coccyx!  I could do a lot better if it didn't hurt so bad."  Community Resources [employer]: Other (Comment)(Wound Care clinic in [facility]) Premorbid Home Care/DME Agencies: Other (Comment)(Pt thinks [facility] Lemmings was agency following when he need IVabx) Transportation available at discharge: yes Resource referrals recommended: Support group (specify)  Discharge Planning Living Arrangements: Spouse/significant other Support Systems: Spouse/significant other, Children, Other relatives, Friends/neighbors, Church/faith community Type of Residence: Private residence Insurance  Resources: Multimedia programmer (specify)([employer] [place]) Financial Resources: Family Support, Employment Financial Screen Referred: No Living Expenses: Higher education careers adviser Management: Patient Does the patient have any problems obtaining your medications?: No Home Management: pt and wife Patient/Family Preliminary Plans: Pt to return home with his and wife and family able to provide assist as needed.  He also notes that their home is currently "for sale" and they have plans to move to more accessible apt when it sells. Expected length of stay: 7-10 days  Clinical Impression Very pleasant gentleman here following BKA due to cellulitis and non-healing wound.  Pt reports he felt emotionally prepared for the surgery as he has known it was highly likely for over a year.  Very optimistic about longer term recovery/ adaptation to prosthesis.  Denies any significant emotional distress.  Good family support.  Will follow for d/c planning needs.  [name], [name] 09/11/2017, 11:36 AM

## 2017-09-11 NOTE — Evaluation (Signed)
Physical Therapy Assessment and Plan  Patient Details  Name: Chase Clark MRN: [id number] Date of Birth: 02/26/1966  PT Diagnosis: Difficulty walking, Impaired sensation and Muscle weakness Rehab Potential: Good ELOS: 7-10  days   Today's Date: 09/10/2017 PT Individual Time:1105-1205 60 min   Problem List:  Patient Active Problem List   Diagnosis Date Noted  . Poorly controlled type 2 diabetes mellitus with peripheral neuropathy ([place])   . Diabetic osteomyelitis ([place])   . Amputation of left lower extremity below knee ([place]) 09/09/2017  . Benign prostatic hyperplasia   . Hyperlipidemia   . Stage 3 chronic kidney disease ([place])   . Neuropathic pain   . Osteomyelitis of left foot ([place])   . Coronary artery disease involving native coronary artery of native heart without angina pectoris   . Benign essential HTN   . Diabetes mellitus type 2 in nonobese (HCC)   . Post-operative pain   . Acute blood loss anemia   . Anemia of chronic disease   . Leukocytosis   . Sepsis ([place]) 09/04/2017  . Pressure injury of skin 08/24/2017  . DKA (diabetic ketoacidoses) ([place]) 08/23/2017  . Cervical spondylosis with myelopathy and radiculopathy 05/07/2017  . ARF (acute renal failure) ([place]) 04/30/2017  . Upper respiratory infection 04/29/2017  . Preop examination 04/29/2017  . Fatty liver 04/04/2017  . Routine general medical examination at a health care facility 01/13/2017  . DDD (degenerative disc disease), cervical 01/13/2017  . PAD (peripheral artery disease) ([place]) 08/07/2016  . Ulcer of ankle ([place]) 08/07/2016  . Chronic diarrhea 05/17/2016  . Hyponatremia 05/17/2016  . Anemia 01/12/2014  . Disorder of ligament of ankle 07/31/2013  . Edema 06/28/2013  . 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 Obesity (BMI 30-39.9) 06/12/2013  . Pain in joint, shoulder region 06/12/2013  . Statin intolerance 06/10/2013  . Diabetic ulcer of foot with muscle involvement without evidence of  necrosis ([place])   . DM (diabetes mellitus), type 2, uncontrolled w/ophthalmic complication ([place]) 99/35/7017  . Hyperlipidemia associated with type 2 diabetes mellitus ([place]) 07/17/2006  . GILBERT'S SYNDROME 07/17/2006  . Essential hypertension 07/17/2006  . Coronary atherosclerosis 07/17/2006  . OSTEOARTHRITIS 07/17/2006  . COUGH 07/17/2006    Past Medical History:  Past Medical History:  Diagnosis Date  . Arthritis   . Asthma    as child  . Coronary artery disease    DES DIAG1 11/20/09 [facility])  . Diabetes mellitus without complication ([place])   . Diverticulosis   . Edema, lower extremity   . Fatty liver   . GERD (gastroesophageal reflux disease)   . Gilbert's disease   . Glaucoma   . Heart disease 2011   Patient has stent for 80% blockage  . Hyperlipidemia   . Hypertension   . Neuropathy   . Seasonal allergies   . Shoulder pain   . Ulcer of foot ([place]) 08.06.2014   DIABETIC ULCERATIONS ASSOCIATED WITH IRRITATION LATERAL ANKLE LEFT GREATER THAN RIGHT WITH MILD CELLULITIS   Past Surgical History:  Past Surgical History:  Procedure Laterality Date  . ABDOMINAL AORTOGRAM N/A 05/20/2016   Procedure: Abdominal Aortogram possible intervention;  Surgeon: Katha Cabal, MD;  Location: [place] [facility];  Service: Cardiovascular;  Laterality: N/A;  . AMPUTATION Left 09/05/2017   Procedure: AMPUTATION BELOW KNEE;  Surgeon: Tania Ade, MD;  Location: [place];  Service: Orthopedics;  Laterality: Left;  . ANTERIOR CERVICAL DECOMP/DISCECTOMY FUSION N/A 05/07/2017   Procedure: ANTERIOR CERVICAL DECOMPRESSION/DISCECTOMY Luevenia Maxin PROSTHESIS,PLATE/SCREWS CERVICAL  FIVE - CERVICAL SIX;  Surgeon: Newman Pies, MD;  Location: [place];  Service: Neurosurgery;  Laterality: N/A;  . CARDIAC CATHETERIZATION    . CATARACT EXTRACTION W/ INTRAOCULAR LENS IMPLANT    . CATARACT EXTRACTION W/PHACO Left 02/26/2017   Procedure: CATARACT EXTRACTION PHACO AND INTRAOCULAR LENS PLACEMENT (IOC);   Surgeon: Eulogio Bear, MD;  Location: [facility];  Service: Ophthalmology;  Laterality: Left;  Lot # [id number] H [place]: 00:25.3 AP%: 6.2 CDE: 1.59   . CHOLECYSTECTOMY N/A   . CORONARY ANGIOPLASTY WITH STENT PLACEMENT  2007   1st diagonal  . EPIBLEPHERON REPAIR WITH TEAR DUCT PROBING    . EYE SURGERY Right sept 29n2015   cataract extraction  . INSERTION EXPRESS TUBE SHUNT Right 09/06/2015   Procedure: INSERTION AHMED TUBE SHUNT with tutoplast allograft;  Surgeon: Eulogio Bear, MD;  Location: [facility];  Service: Ophthalmology;  Laterality: Right;  Marland Kitchen VASECTOMY      Assessment & Plan Clinical Impression: Patient is a 52 year old right-handed male with history of CAD with PCI 2007, diabetes mellitus, CKD stage III, hypertension, chronic left foot ulcer followed at the wound center in [facility] with frequent debridements.  Per chart review and daughter he lives with spouse.  Reported to be independent prior to admission.  2 level home with bedroom on Main level and 2 steps to entry of home.  Wife works during the day.  Presented 09/04/2017 with generalized weakness and near syncopal episode with fall.  Patient with recent admission of proximal 1 week ago for DKA at [facility] hospital discharge to home returning the following day with weakness.  Noted complaints of back pain.  Chest x-ray negative.  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  MRI lumbar spine unremarkable.  Noted WBC 16,700, lactic acid 4.4.  Suspect sepsis related to left foot wound.  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 [name].  Hospital course pain management.  Subcutaneous heparin for DVT prophylaxis.  Acute on chronic anemia 9.1 and monitored.  Initially maintained on vancomycin and Zosyn for sepsis protocol with cultures thus far negative and changed to doxycycline x4 more days and stop.  Physical occupational therapy evaluations completed with recommendations of physical medicine rehab consult.   Patient  transferred to CIR on 09/09/2017 .   Patient currently requires min with mobility secondary to muscle weakness and muscle joint tightness and decreased standing balance, decreased balance strategies and difficulty maintaining precautions.  Prior to hospitalization, patient was independent  with mobility and lived with Spouse in a House home.  Home access is 2 in front and 4 into garage.  No rails on front steps, one on the Rt at garage stairsStairs to enter.  Patient will benefit from skilled PT intervention to maximize safe functional mobility, minimize fall risk and decrease caregiver burden for planned discharge home with intermittent assist.  Anticipate patient will benefit from follow up [facility] at discharge.  PT - End of Session Activity Tolerance: Tolerates 30+ min activity with multiple rests Endurance Deficit: Yes PT Assessment Rehab Potential (ACUTE/IP ONLY): Good PT Barriers to Discharge: Inaccessible home environment;Decreased caregiver support;Home environment access/layout PT Patient demonstrates impairments in the following area(s): Balance;Edema;Endurance;Pain;Safety;Sensory;Skin Integrity PT Transfers Functional Problem(s): Bed Mobility;Bed to Chair;Car;Furniture;Floor PT Locomotion Functional Problem(s): Ambulation;Wheelchair Mobility;Stairs PT Plan PT Intensity: Minimum of 1-2 x/day ,45 to 90 minutes PT Frequency: 5 out of 7 days PT Duration Estimated Length of Stay: 7-10  PT Treatment/Interventions: Ambulation/gait training;Balance/vestibular training;Community reintegration;Discharge planning;Disease management/prevention;DME/adaptive equipment instruction;Functional electrical  stimulation;Functional mobility training;Neuromuscular re-education;Patient/family education;Pain management;Psychosocial support;Skin care/wound management;Splinting/orthotics;Therapeutic Activities;UE/LE Strength taining/ROM;Therapeutic Exercise;UE/LE Coordination activities;Visual/perceptual  remediation/compensation;Wheelchair propulsion/positioning PT Transfers Anticipated Outcome(s): Mod I with LRAD  PT Locomotion Anticipated Outcome(s): ambulatory Mod I with LRAD for short distances and Mod I in [facility]  PT Recommendation Recommendations for Other Services: Therapeutic Recreation consult;Neuropsych consult Therapeutic Recreation Interventions: Stress management;Outing/community reintergration Follow Up Recommendations: Home health PT Patient destination: Home Equipment Recommended: Wheelchair (measurements);Wheelchair cushion (measurements);Rolling walker with 5" wheels  Skilled Therapeutic Intervention Pt received supine in bed and agreeable to PT. Supine>sit transfer with supervision assist and min cues . PT instructed patient in PT Evaluation and initiated treatment intervention; see below for results. PT educated patient in [place], rehab potential, rehab goals, and discharge recommendations. Pt returned to room and performed squat pivot transfer to bed with min assist as listed below. Sit>supine completed with supervision and left supine in bed with call bell in reach and all needs met.      PT Evaluation Precautions/Restrictions   fall, L BKA General   Vital SignsTherapy Vitals Temp: 99.6 F (37.6 C) Temp Source: Oral Pulse Rate: 90 Resp: 18 BP: (!) 171/92 Patient Position (if appropriate): Lying Oxygen Therapy SpO2: 96 % O2 Device: Room Air Pain  3/10 coccyx.  Home Living/Prior Functioning Home Living Available Help at Discharge: Family;Available 24 hours/day(24/7) Type of Home: House Home Access: Stairs to enter [employer] of Steps: 2 in front and 4 into garage.  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  Cognition Overall Cognitive Status: Within Functional Limits for tasks assessed Arousal/Alertness: Awake/alert Orientation Level: Oriented X4 Attention: Selective Selective Attention: Appears intact Awareness: Appears intact Problem Solving: Appears intact Safety/Judgment: Appears intact Sensation Sensation Light Touch: Impaired Detail(BLE) Proprioception: Appears Intact(BUE) Coordination Fine Motor Movements are Fluid and Coordinated: Yes Finger Nose Finger Test: [facility] Motor  Motor Motor: Within Functional Limits Motor - Skilled Clinical Observations: WFL  Mobility Bed Mobility Bed Mobility: Rolling Right;Rolling Left;Supine to Sit;Sit to Supine Rolling Right: 5: Supervision Rolling Left: 5: Supervision Rolling Left Details: Verbal cues for precautions/safety;Verbal cues for technique Supine to Sit: 5: Supervision Supine to Sit Details: Verbal cues for technique;Verbal cues for precautions/safety Sit to Supine: 5: Supervision Sit to Supine - Details: Visual cues/gestures for sequencing;Verbal cues for sequencing Transfers Transfers: Yes Sit to Stand: 4: Min assist Sit to Stand Details: Verbal cues for technique;Verbal cues for precautions/safety Stand Pivot Transfers: 4: Min assist(with RW ) Stand Pivot Transfer Details: Verbal cues for technique;Verbal cues for precautions/safety Squat Pivot Transfers: 4: Min Risk manager Details: Verbal cues for technique;Verbal cues for precautions/safety  Car transfer: 4 min assist with RW  Locomotion  Ambulation Ambulation: Yes Ambulation/Gait Assistance: 4: Min assist Ambulation Distance  (Feet): 20 Feet Assistive device: Rolling walker Ambulation/Gait Assistance Details: Verbal cues for technique;Verbal cues for precautions/safety;Verbal cues for gait pattern Gait Gait: Yes Gait Pattern: Impaired Stairs / Additional Locomotion Stairs: No Wheelchair Mobility Wheelchair Mobility: Yes Wheelchair Assistance: 5: Personnel officer Assistance Details: Verbal cues for Marketing executive: Both  upper extremities Wheelchair Parts Management: Needs assistance Distance: 165f   Trunk/Postural Assessment  Cervical Assessment Cervical Assessment: Within Functional Limits Thoracic Assessment Thoracic Assessment: Within Functional Limits Lumbar Assessment Lumbar Assessment: Exceptions to WFL(lateral lean due to fx coccyx. ) Postural Control Postural Control: Within Functional Limits  Balance Balance Balance Assessed: Yes Static Sitting Balance Static Sitting - Level of Assistance: 6: Modified independent (Device/Increase time) Dynamic Sitting Balance Dynamic Sitting - Level of Assistance: 5: Stand by assistance Sitting balance - Comments: due to pain  Static Standing Balance Static Standing - Level of Assistance: 5: Stand by assistance Static Standing - Comment/# of Minutes: with RW  Dynamic Standing Balance Dynamic Standing - Balance Support: Bilateral upper extremity supported Dynamic Standing - Level of Assistance: 4: Min assist Extremity Assessment      RLE Assessment RLE Assessment: Within Functional Limits LLE Assessment LLE Assessment: Exceptions to [facility]LE Strength LLE Overall Strength Comments: 4+/5 hip flexion and add/abduction. 4/5 knee flexion/extension   See Function Navigator for Current Functional Status.   Refer to Care Plan for Long Term Goals  Recommendations for other services: Neuropsych and Therapeutic Recreation  Stress management and Outing/community reintegration  Discharge Criteria: Patient will be discharged from PT  if patient refuses treatment 3 consecutive times without medical reason, if treatment goals not met, if there is a change in medical status, if patient makes no progress towards goals or if patient is discharged from hospital.  The above assessment, treatment plan, treatment alternatives and goals were discussed and mutually agreed upon: by patient  ALorie Phenix5/31/2019, 6:16 AM

## 2017-09-11 NOTE — Progress Notes (Signed)
Physical Therapy Session Note  Patient Details  Name: Chase Clark MRN: [id number] Date of Birth: 11/24/1965  Today's Date: 09/11/2017 PT Individual Time: 1110-1205 AND 1430-1540 PT Individual Time Calculation (min): 55 min and 70 min   Short Term Goals: Week 1:  PT Short Term Goal 1 (Week 1): STG=LTG due to ELOS   Skilled Therapeutic Interventions/Progress Updates:    session 1  Pt received supine in bed and agreeable to PT. Supine>sit transfer with supervision assist and min cues for use of bed features.   Squat pivot transfer to [facility] with min assist from PT and min cues from PT for set up. Squat pivot transfer also completed to mat table in rehab gym with min assist from PT.    PT instructed pt in sidelying therex, BLE: hip extension  x 15, hip abduction x 15, and knee flexion/extension. Cues for positioning and safety.   PT instructed pt in stair training x 4 in parallel bars. 2 forward/2 backward with min assist and cues for safety and technique.   Pt returned to room and performed squat pivot transfer to bed with min assist. Sit>supine completed without assist and left supine in bed with call bell in reach and all needs met.   Session 2.   Pt received supine in bed and agreeable to PT. Supine>sit transfer without assist or cues.   PT instructed pt in gait training x 15f with min assist from PT and min cues for safety with turns and AD management through doorway.   Pt propelled WC to rehab apartment x 1553fwith supervision assist. Reports significant increase in coccyix pain upon completion of WC mobilty.   Ambulatory transfer to bed with supervision assist and RW. Min cues for safety and RW in turn around edge of bed. Sit<>supine without assist from PT.   PT instructed pt in supine/sidelying therex. Knee flexion/extension, hip abduction, bridge through thighs. Ankle PF with manual resistance, hip flexion.extension with manual resistance, all completed x 15 BLE with  supervision assist from PT with cues for full pain free ROM. Stand [facility] and supervision assist from PT. Sit<>stand from EOB x 3 with supervision assist from PT and min cues for safety.    Pt returned to room and performed squat pivot  transfer to bed with supervision assist. Sit>supine completed without assist of cues and left supine in bed with call bell in reach and all needs met.         Therapy Documentation Precautions:  Precautions Precautions: Fall Precaution Comments: fx coccyx Restrictions Weight Bearing Restrictions: Yes LLE Weight Bearing: Non weight bearing    Vital Signs: Therapy Vitals Temp: 98.4 F (36.9 C) Temp Source: Oral Pulse Rate: 89 Resp: 18 BP: 126/83 Patient Position (if appropriate): Lying Oxygen Therapy SpO2: 98 % O2 Device: Room Air Pain: Pain Assessment Pain Score: 6 coccyx.    See Function Navigator for Current Functional Status.   Therapy/Group: Individual Therapy  AuLorie Phenix/31/2019, 6:56 PM

## 2017-09-12 ENCOUNTER — Inpatient Hospital Stay (HOSPITAL_COMMUNITY): Payer: BLUE CROSS/BLUE SHIELD | Admitting: Physical Therapy

## 2017-09-12 ENCOUNTER — Inpatient Hospital Stay (HOSPITAL_COMMUNITY): Payer: BLUE CROSS/BLUE SHIELD

## 2017-09-12 LAB — GLUCOSE, CAPILLARY
Glucose-Capillary: 124 mg/dL — ABNORMAL HIGH (ref 65–99)
Glucose-Capillary: 112 mg/dL — ABNORMAL HIGH (ref 65–99)
Glucose-Capillary: 58 mg/dL — ABNORMAL LOW (ref 65–99)
Glucose-Capillary: 83 mg/dL (ref 65–99)
Glucose-Capillary: 150 mg/dL — ABNORMAL HIGH (ref 65–99)

## 2017-09-12 NOTE — Significant Event (Signed)
Hypoglycemic Event  CBG: 58  Treatment: 15 GM carbohydrate snack  Symptoms: None  Follow-up CBG: Time:1705 CBG Result:83  Possible Reasons for Event: Medication regimen:   Comments/MD notified:pt eating meal that wife brought from home    [place]

## 2017-09-12 NOTE — Progress Notes (Signed)
Occupational Therapy Session Note  Patient Details  Name: Chase Clark MRN: [id number] Date of Birth: 1965-12-25  Today's Date: 09/12/2017 OT Individual Time: 6010-9323 OT Individual Time Calculation (min): 71 min    Short Term Goals: Week 1:  OT Short Term Goal 1 (Week 1): STG = LTGs due to ELOS  Skilled Therapeutic Interventions/Progress Updates:    1:1. Pt reporting pain in buttocks but does not rate. RN aware and administers medication. Pt completes squat pivot to L with supervision and VC for w/c parts management. Pt requesting to discuss access to shower/DME options to maximize safety. Pt completes w/c propulsion part way to ADL aparment with supervision. Pt and OT spend significantly long amount of time discussing shower set up DME options as well as entry methods. OT disucsses shower chair v TTB to use to access walk in shower. D/t glass doors unsure if pt can use TTB to bridge over small shower ledge. Pt able to complete posterior method to access shower to hop, however pt stilll believes bringing in walker into shower will be safer. OT brings up point that pt would have to lift walker over ledge to bring walker into shower and balacne on one leg. Pt verbalizes understanding that wife/second person should be with pt while showering as well as practicing dry run or waiting to shower with HHOT to problem solve set up would be safest option. Pt lies in prone on mat for hip flexor stretch and pain relief on buttocks. Pt able to complete 2x6 6 second isometric hip extension for B glute strengthening in prep for transfers against manual resistance. Exited session with pt sidelying in bed with call light in reach and all needs met.   Therapy Documentation Precautions:  Precautions Precautions: Fall Precaution Comments: fx coccyx Restrictions Weight Bearing Restrictions: Yes LLE Weight Bearing: Non weight bearing General:    See Function Navigator for Current Functional  Status.   Therapy/Group: Individual Therapy  Tonny Branch 09/12/2017, 11:03 AM

## 2017-09-12 NOTE — Progress Notes (Signed)
[facility] PHYSICAL MEDICINE & REHABILITATION     PROGRESS NOTE  Subjective/Complaints:  Patient seen lying in bed this morning. He states he slept fairly overnight. He denies complaints.  ROS: denies CP, SOB, nausea, vomiting, diarrhea.  Objective: Vital Signs: Blood pressure 119/71, pulse 87, temperature 98.8 F (37.1 C), resp. rate 16, height 5\' 10"  (1.778 m), weight 84.9 kg (187 lb 2.7 oz), SpO2 97 %. No results found. Recent Labs    09/10/17 0556  WBC 9.6  HGB 9.0*  HCT 27.3*  PLT 288   Recent Labs    09/10/17 0556  NA 136  K 3.7  CL 99*  GLUCOSE 134*  BUN 9  CREATININE 1.64*  CALCIUM 8.7*   CBG (last 3)  Recent Labs    09/12/17 1204 09/12/17 1639 09/12/17 1705  GLUCAP 112* 58* 83    Wt Readings from Last 3 Encounters:  09/09/17 84.9 kg (187 lb 2.7 oz)  09/04/17 84.8 kg (187 lb)  08/23/17 84.9 kg (187 lb 2.7 oz)    Physical Exam:  BP 119/71 (BP Location: Left Arm)   Pulse 87   Temp 98.8 F (37.1 C)   Resp 16   Ht 5\' 10"  (1.778 m)   Wt 84.9 kg (187 lb 2.7 oz)   SpO2 97%   BMI 26.86 kg/m  Constitutional: He appearswell-developedand well-nourished. NAD. HENT: Normocephalicand atraumatic.  Eyes:EOMare normal. No discharge.  Cardiovascular:RRR. No JVD. Respiratory:Effort normal and breath sounds normal.   HY:WVPXT sounds are normal. He exhibitsno distension.  Musculoskeletal: Left stump with tenderness and mild edema Neurological: He isalert.  Alert and oriented. Motor: B/l UE 5/5 proximal to distal RLE: 4+/5 proximal to distal LLE: HF 4+/5 (pain inhibition, unchanged) Skin: Skin iswarmand dry. BKA site with dressing C/D/I Psychiatric: He has anormal mood and affect. Hisbehavior is normal.  Assessment/Plan: 1. Functional deficits secondary to Left BKA which require 3+ hours per day of interdisciplinary therapy in a comprehensive inpatient rehab setting. Physiatrist is providing close team supervision and 24 hour  management of active medical problems listed below. Physiatrist and rehab team continue to assess barriers to discharge/monitor patient progress toward functional and medical goals.  Function:  Bathing Bathing position   Position: Wheelchair/chair at sink  Bathing parts Body parts bathed by patient: Right arm, Left arm, Chest, Abdomen, Front perineal area, Buttocks, Right upper leg, Left upper leg Body parts bathed by helper: Back  Bathing assist Assist Level: Touching or steadying assistance(Pt > 75%)      Upper Body Dressing/Undressing Upper body dressing   What is the patient wearing?: Pull over shirt/dress     Pull over shirt/dress - Perfomed by patient: Thread/unthread right sleeve, Thread/unthread left sleeve, Put head through opening, Pull shirt over trunk          Upper body assist Assist Level: Touching or steadying assistance(Pt > 75%)      Lower Body Dressing/Undressing Lower body dressing   What is the patient wearing?: Underwear, Pants, Non-skid slipper socks Underwear - Performed by patient: Thread/unthread left underwear leg, Pull underwear up/down Underwear - Performed by helper: Thread/unthread right underwear leg Pants- Performed by patient: Pull pants up/down Pants- Performed by helper: Thread/unthread right pants leg, Thread/unthread left pants leg   Non-skid slipper socks- Performed by helper: Don/doff right sock                  Lower body assist Assist for lower body dressing: (Max assist)      Psychologist, occupational  Toileting steps completed by patient: Adjust clothing prior to toileting, Adjust clothing after toileting Toileting steps completed by helper: Performs perineal hygiene Toileting Assistive Devices: Grab bar or rail  Toileting assist Assist level: Touching or steadying assistance (Pt.75%)   Transfers Chair/bed transfer   Chair/bed transfer method: Squat pivot Chair/bed transfer assist level: Supervision or verbal  cues Chair/bed transfer assistive device: Armrests     Locomotion Ambulation     Max distance: 35 Assist level: Supervision or verbal cues   Wheelchair   Type: Manual Max wheelchair distance: 116ft  Assist Level: Supervision or verbal cues  Cognition Comprehension Comprehension assist level: Follows basic conversation/direction with no assist  Expression Expression assist level: Expresses basic needs/ideas: With no assist  Social Interaction Social Interaction assist level: Interacts appropriately with others - No medications needed.  Problem Solving Problem solving assist level: Solves basic 90% of the time/requires cueing < 10% of the time  Memory Memory assist level: Recognizes or recalls 75 - 89% of the time/requires cueing 10 - 24% of the time    Medical Problem List and Plan: 1.  Decreased functional mobility secondary to sepsis secondary to left diabetic foot ulcer status post left BKA 09/05/2017  Continue CIR 2.  DVT Prophylaxis/Anticoagulation: Subcutaneous heparin.  Monitor for any bleeding episodes 3. Pain Management: Neurontin 600 mg twice daily, Lidoderm patch, Ultram and oxycodone as needed 4. Mood: Provide emotional support 5. Neuropsych: This patient is capable of making decisions on his own behalf. 6. Skin/Wound Care: Routine skin checks 7. Fluids/Electrolytes/Nutrition: Routine in and outs  8.  Acute blood loss anemia.     Hemoglobin 9.0 on 5/30 9.  ID/sepsis.    Cultures NGTD on 6/1.    Vancomycin and Zosyn changed to doxycycline 09/08/2017 1 more days and stopped 10.  CKD stage III.     Creatinine 1.64 on 5/30 11.  Diabetes mellitus with peripheral neuropathy as well as history of DKA.  Hemoglobin A1c 15.3.  Lantus insulin 50 units daily.  Check blood sugars before meals and at bedtime.  Diabetic teaching   Slightly labile on 6/1 12.  CAD with PCI stenting 2007.  Continue aspirin.  No chest pain or shortness of breath 13.  Hypertension.  Norvasc 10 mg  daily   Controlled on 6/1 14.  Hyperlipidemia.  Crestor 15.  BPH.  Flomax 0.4 mg daily, increased on 5/30.     PVRs suggesting urinary retention   Will consider additional medications tomorrow  LOS (Days) 3 A FACE TO FACE EVALUATION WAS PERFORMED  Chase Clark 09/12/2017 9:21 PM

## 2017-09-12 NOTE — Progress Notes (Signed)
Physical Therapy Session Note  Patient Details  Name: Chase Clark MRN: [id number] Date of Birth: Jan 06, 1966  Today's Date: 09/12/2017 PT Individual Time: 0900-1015 1300-1345 PT Individual Time Calculation (min): 75 min AND 45   Short Term Goals: Week 1:  PT Short Term Goal 1 (Week 1): STG=LTG due to ELOS   Skilled Therapeutic Interventions/Progress Updates:  Session 1 Pt received supine in bed and agreeable to PT. Supine>sit transfer without assist or cues from PT. Squat pivot transfer to [facility] with supervision assist from PT and min cues for set up . Pt transported to main entrance of hospital in [facility]   Gait training with RW on unlevel cement sidewalk 62f x 2. Supervision assist from PT with min cues for AD management in turns and decreased force of heel contact on the RLE with each step.   WC mobilitiy with supervision assist from PT x 1566fwith supervision assist and min cues for posture to reduce pressure on sacrum.   Dynamic standing balance at dynavision x 3 trials with min progressing to supervision assist with 1 UE support on RW. 24, 25, and 35 respectively.   Pt returned to room and performed squat pivot transfer to bed with supervision assist for safety. Sit>supine completed with out assist, and left supine in bed with call bell in reach and all needs met.    Session 2.   Pt received supine in bed and agreeable to PT.   Bed level UE therex: ball tap with 5lb bar weight 2 x 1 min. Shoulder flexion/extension with 4lb ball 2 x 15, tricep extension with 4lb ball. Trunk rotation 2 x 12 Bil, low row in semirecumbent x 15 BUE.   Sit<>supine transfer without assist from PT to come to EOB. sit<>stand at EOB with supervision assist from PT with 1 UE support on RW and min cues for safety.   Gait training with RW x 3537fith supervision assist and min cues from PT for safety in turn and AD management.   Pt returned to bed with mobility as listed above, and left supine in bed with  call bell in reach and all needs met.          Therapy Documentation Precautions:  Precautions Precautions: Fall Precaution Comments: fx coccyx Restrictions Weight Bearing Restrictions: Yes LLE Weight Bearing: Non weight bearing    Pain: Pain Assessment Pain Scale: 0-10 Pain Score: 3  Pain Type: Acute pain Pain Location: Coccyx Pain Orientation: Lower Pain Descriptors / Indicators: Aching Pain Frequency: Constant Pain Onset: On-going Pain Intervention(s): Medication (See eMAR)   See Function Navigator for Current Functional Status.   Therapy/Group: Individual Therapy  AusLorie Phenix1/2019, 10:22 AM

## 2017-09-13 ENCOUNTER — Inpatient Hospital Stay (HOSPITAL_COMMUNITY): Payer: BLUE CROSS/BLUE SHIELD

## 2017-09-13 DIAGNOSIS — N183 Chronic kidney disease, stage 3 (moderate): Secondary | ICD-10-CM

## 2017-09-13 DIAGNOSIS — R0989 Other specified symptoms and signs involving the circulatory and respiratory systems: Secondary | ICD-10-CM

## 2017-09-13 DIAGNOSIS — R7309 Other abnormal glucose: Secondary | ICD-10-CM

## 2017-09-13 DIAGNOSIS — E162 Hypoglycemia, unspecified: Secondary | ICD-10-CM

## 2017-09-13 LAB — GLUCOSE, CAPILLARY
Glucose-Capillary: 141 mg/dL — ABNORMAL HIGH (ref 65–99)
Glucose-Capillary: 168 mg/dL — ABNORMAL HIGH (ref 65–99)
Glucose-Capillary: 141 mg/dL — ABNORMAL HIGH (ref 65–99)
Glucose-Capillary: 195 mg/dL — ABNORMAL HIGH (ref 65–99)

## 2017-09-13 LAB — CULTURE, BLOOD (ROUTINE X 2)
Culture: NO GROWTH
Culture: NO GROWTH
Special Requests: ADEQUATE

## 2017-09-13 MED ORDER — INSULIN GLARGINE 100 UNIT/ML ~~LOC~~ SOLN
25.0000 [IU] | Freq: Every day | SUBCUTANEOUS | Status: DC
  Administered 2017-09-13 – 2017-09-15 (×3): 25 [IU] via SUBCUTANEOUS
  Filled 2017-09-13 (×3): qty 0.25

## 2017-09-13 MED ORDER — BETHANECHOL CHLORIDE 10 MG PO TABS
10.0000 mg | ORAL_TABLET | Freq: Three times a day (TID) | ORAL | Status: DC
  Administered 2017-09-13 – 2017-09-14 (×3): 10 mg via ORAL
  Filled 2017-09-13 (×3): qty 1

## 2017-09-13 NOTE — Progress Notes (Signed)
[place] PHYSICAL MEDICINE & REHABILITATION     PROGRESS NOTE  Subjective/Complaints:  Pt seen lying in bed this AM. He states he slept better.  He feels he is improving from all aspects.  He relays his wife's concerns regarding urinary retention. Discussed hypoglycemia with nursing.   ROS: Denies CP, SOB, nausea, vomiting, diarrhea.  Objective: Vital Signs: Blood pressure (!) 149/78, pulse 86, temperature 98 F (36.7 C), resp. rate 17, height 5\' 10"  (1.778 m), weight 84.9 kg (187 lb 2.7 oz), SpO2 99 %. No results found. No results for input(s): WBC, HGB, HCT, PLT in the last 72 hours. No results for input(s): NA, K, CL, GLUCOSE, BUN, CREATININE, CALCIUM in the last 72 hours.  Invalid input(s): CO CBG (last 3)  Recent Labs    09/12/17 1705 09/12/17 2145 09/13/17 0650  GLUCAP 83 150* 141*    Wt Readings from Last 3 Encounters:  09/09/17 84.9 kg (187 lb 2.7 oz)  09/04/17 84.8 kg (187 lb)  08/23/17 84.9 kg (187 lb 2.7 oz)    Physical Exam:  BP (!) 149/78 (BP Location: Left Arm)   Pulse 86   Temp 98 F (36.7 C)   Resp 17   Ht 5\' 10"  (1.778 m)   Wt 84.9 kg (187 lb 2.7 oz)   SpO2 99%   BMI 26.86 kg/m  Constitutional: He appearswell-developedand well-nourished. NAD. HENT: Normocephalicand atraumatic.  Eyes:EOMare normal. No discharge.  Cardiovascular:RRR. No JVD. Respiratory:Effort normal and breath sounds normal.   HF:WYOVZ sounds are normal. He exhibitsno distension.  Musculoskeletal: Left stump with tenderness and mild edema Neurological: He isalert.  Alert and oriented. Motor: B/l UE 5/5 proximal to distal RLE: 4+/5 proximal to distal LLE: HF 4+/5 (pain inhibition, stable) Skin: Skin iswarmand dry. BKA site with dressing C/D/I Psychiatric: He has anormal mood and affect. Hisbehavior is normal.  Assessment/Plan: 1. Functional deficits secondary to Left BKA which require 3+ hours per day of interdisciplinary therapy in a comprehensive  inpatient rehab setting. Physiatrist is providing close team supervision and 24 hour management of active medical problems listed below. Physiatrist and rehab team continue to assess barriers to discharge/monitor patient progress toward functional and medical goals.  Function:  Bathing Bathing position   Position: Wheelchair/chair at sink  Bathing parts Body parts bathed by patient: Right arm, Left arm, Chest, Abdomen, Front perineal area, Buttocks, Right upper leg, Left upper leg Body parts bathed by helper: Back  Bathing assist Assist Level: Touching or steadying assistance(Pt > 75%)      Upper Body Dressing/Undressing Upper body dressing   What is the patient wearing?: Pull over shirt/dress     Pull over shirt/dress - Perfomed by patient: Thread/unthread right sleeve, Thread/unthread left sleeve, Put head through opening, Pull shirt over trunk          Upper body assist Assist Level: Touching or steadying assistance(Pt > 75%)      Lower Body Dressing/Undressing Lower body dressing   What is the patient wearing?: Underwear, Pants, Non-skid slipper socks Underwear - Performed by patient: Thread/unthread left underwear leg, Pull underwear up/down Underwear - Performed by helper: Thread/unthread right underwear leg Pants- Performed by patient: Pull pants up/down Pants- Performed by helper: Thread/unthread right pants leg, Thread/unthread left pants leg   Non-skid slipper socks- Performed by helper: Don/doff right sock                  Lower body assist Assist for lower body dressing: (Max assist)  Toileting Toileting   Toileting steps completed by patient: Adjust clothing prior to toileting, Adjust clothing after toileting Toileting steps completed by helper: Performs perineal hygiene Toileting Assistive Devices: Grab bar or rail  Toileting assist Assist level: Touching or steadying assistance (Pt.75%)   Transfers Chair/bed transfer   Chair/bed transfer  method: Squat pivot Chair/bed transfer assist level: Supervision or verbal cues Chair/bed transfer assistive device: Armrests     Locomotion Ambulation     Max distance: 35 Assist level: Supervision or verbal cues   Wheelchair   Type: Manual Max wheelchair distance: 1104ft  Assist Level: Supervision or verbal cues  Cognition Comprehension Comprehension assist level: Follows basic conversation/direction with no assist  Expression Expression assist level: Expresses basic needs/ideas: With no assist  Social Interaction Social Interaction assist level: Interacts appropriately with others - No medications needed.  Problem Solving Problem solving assist level: Solves basic 90% of the time/requires cueing < 10% of the time  Memory Memory assist level: Recognizes or recalls 75 - 89% of the time/requires cueing 10 - 24% of the time    Medical Problem List and Plan: 1.  Decreased functional mobility secondary to sepsis secondary to left diabetic foot ulcer status post left BKA 09/05/2017  Continue CIR 2.  DVT Prophylaxis/Anticoagulation: Subcutaneous heparin.  Monitor for any bleeding episodes 3. Pain Management: Neurontin 600 mg twice daily, Lidoderm patch, Ultram and oxycodone as needed 4. Mood: Provide emotional support 5. Neuropsych: This patient is capable of making decisions on his own behalf. 6. Skin/Wound Care: Routine skin checks 7. Fluids/Electrolytes/Nutrition: Routine in and outs  8.  Acute blood loss anemia.     Hemoglobin 9.0 on 5/30  Labs ordered for tomorrow 9.  ID/sepsis.    Cultures NGTD on 6/2.    Vancomycin and Zosyn changed to doxycycline completed on 6/2 10.  CKD stage III.     Creatinine 1.64 on 5/30  Labs ordered for tomorrow 11.  Diabetes mellitus with peripheral neuropathy as well as history of DKA.  Hemoglobin A1c 15.3.    Lantus insulin 50 units daily decreased to 25 on 6/2    Check blood sugars before meals and at bedtime.  Diabetic teaching   Hypoglycemia  on 6/2 12.  CAD with PCI stenting 2007.  Continue aspirin.  No chest pain or shortness of breath 13.  Hypertension.  Norvasc 10 mg daily   Labile on 6/2 14.  Hyperlipidemia.  Crestor 15.  BPH.  Flomax 0.4 mg daily, increased on 5/30.     PVRs suggesting urinary retention   Urecholine started on 6/2  LOS (Days) 4 A FACE TO FACE EVALUATION WAS PERFORMED  Ankit Lorie Phenix 09/13/2017 9:28 AM

## 2017-09-13 NOTE — Progress Notes (Signed)
Occupational Therapy Session Note  Patient Details  Name: Chase Clark MRN: [id number] Date of Birth: May 08, 1965  Today's Date: 09/13/2017 OT Individual Time: 9528-4132 OT Individual Time Calculation (min): 58 min    Short Term Goals: Week 1:  OT Short Term Goal 1 (Week 1): STG = LTGs due to ELOS  Skilled Therapeutic Interventions/Progress Updates:    1:1. Pt c/o painin buttocks. RN aware and clearing pt to shower with residual limb covered. Pt completes squat pivot transfer with increased time and Vc for set up/w/c parts management and min A-supervision throughout session. Pt bathes at sit to stand level using grab bar with VC for steadying with supervision and crossing RLE over LLE to wash foot. Pt completes dressing at sit to stand level at sink with VC for unilateral support on sink as opposed to leaning hips into sink and advancing pants past hips with both hands to decerased risk of falling. Pt lies prone on bed to stretch out hip flexors while OT changes amputee pad/adjusts to promote knee extension as pt sits with  Knee slightly flexed with current pad. Exited session wihtpt seated in bed, call light in reach and all needs met  Therapy Documentation Precautions:  Precautions Precautions: Fall Precaution Comments: fx coccyx Restrictions Weight Bearing Restrictions: Yes LLE Weight Bearing: Non weight bearing General:   Vital Signs: Therapy Vitals Temp: 98 F (36.7 C) Pulse Rate: 86 Resp: 17 BP: (!) 149/78 Patient Position (if appropriate): Lying Oxygen Therapy SpO2: 99 % O2 Device: Room Air Pain: Pain Assessment Pain Scale: 0-10 Pain Score: 5  Pain Type: Acute pain Pain Location: Buttocks Pain Descriptors / Indicators: Aching Pain Frequency: Constant Pain Onset: On-going Patients Stated Pain Goal: 3 Pain Intervention(s): Medication (See eMAR)  See Function Navigator for Current Functional Status.   Therapy/Group: Individual Therapy  Tonny Branch 09/13/2017, 9:30 AM

## 2017-09-13 NOTE — Plan of Care (Signed)
  Problem: Consults Goal: RH LIMB LOSS PATIENT EDUCATION Description Patient will increase knowledge of how to manage limb loss mod I prior to discharge.  Outcome: Progressing   Problem: RH BOWEL ELIMINATION Goal: RH STG MANAGE BOWEL WITH ASSISTANCE Description STG Manage Bowel with  Mod I.  Outcome: Progressing   Problem: RH SKIN INTEGRITY Goal: RH STG SKIN FREE OF INFECTION/BREAKDOWN Description Patient will be able to verbalize signs and symptoms of infection and breakdown prior to discharge.   Outcome: Progressing Goal: RH STG MAINTAIN SKIN INTEGRITY WITH ASSISTANCE Description STG Maintain Skin Integrity With mod I  Outcome: Progressing Goal: RH STG ABLE TO PERFORM INCISION/WOUND CARE W/ASSISTANCE Description STG Able To Perform Incision/Wound Care With min  Assistance.  Outcome: Progressing   Problem: RH SAFETY Goal: RH STG ADHERE TO SAFETY PRECAUTIONS W/ASSISTANCE/DEVICE Description STG Adhere to Safety Precautions With  Mod I device  Outcome: Progressing Goal: RH STG DEMO UNDERSTANDING HOME SAFETY PRECAUTIONS Description Patient will be able to verbalize home safety precautions prior to discharge.  Outcome: Progressing   Problem: RH PAIN MANAGEMENT Goal: RH STG PAIN MANAGED AT OR BELOW PT'S PAIN GOAL Description Less than 3   Outcome: Progressing   Problem: RH KNOWLEDGE DEFICIT LIMB LOSS Goal: RH STG INCREASE KNOWLEDGE OF SELF CARE AFTER LIMB LOSS Description Patient will be able to verbalize management of caring for BKA independently at discharge  Outcome: Progressing

## 2017-09-14 ENCOUNTER — Inpatient Hospital Stay (HOSPITAL_COMMUNITY): Payer: BLUE CROSS/BLUE SHIELD | Admitting: Physical Therapy

## 2017-09-14 ENCOUNTER — Inpatient Hospital Stay (HOSPITAL_COMMUNITY): Payer: BLUE CROSS/BLUE SHIELD | Admitting: Occupational Therapy

## 2017-09-14 LAB — GLUCOSE, CAPILLARY
Glucose-Capillary: 149 mg/dL — ABNORMAL HIGH (ref 65–99)
Glucose-Capillary: 161 mg/dL — ABNORMAL HIGH (ref 65–99)
Glucose-Capillary: 161 mg/dL — ABNORMAL HIGH (ref 65–99)
Glucose-Capillary: 212 mg/dL — ABNORMAL HIGH (ref 65–99)

## 2017-09-14 MED ORDER — BETHANECHOL CHLORIDE 25 MG PO TABS
25.0000 mg | ORAL_TABLET | Freq: Three times a day (TID) | ORAL | Status: DC
  Administered 2017-09-14 – 2017-09-15 (×3): 25 mg via ORAL
  Filled 2017-09-14 (×3): qty 1

## 2017-09-14 NOTE — Progress Notes (Signed)
Physical Therapy Session Note  Patient Details  Name: Chase Clark MRN: [id number] Date of Birth: Sep 20, 1965  Today's Date: 09/14/2017 PT Individual Time: 1135-1200 and 1300-1400 PT Individual Time Calculation (min): 25 min and 60 min  Short Term Goals: Week 1:  PT Short Term Goal 1 (Week 1): STG=LTG due to ELOS   Skilled Therapeutic Interventions/Progress Updates: Tx1; Pt presented sidelying in bed agreeable to therapy. Session focused on sitting tolerance and UE strengthening/endurance. Pt participated in UE therex with 2# and # weights including shoulder flexion, bicep curls, shoulder abd, tricep extensions x 15 bilaterally. Pt performed sitting at EOB however required rest breaks in sidelying between ea activity due to increased pain in coccyx. Discussed pressure relief techniques in w/c which pt was agreeable to trying I next session. Pt able to perform sidelying to sit on L and R with supervision and HOB elevated. Pt returned to sidelying at EOB at end of session with bed alarm on and needs met.   Tx2: Pt presented in bed agreeable to therapy. Session focused on functional mobility and pain management in w/c. Provided pt edue on w/c management, placing leg rests and use of breaks. Pt able to don leg rests on w/c independently after instruction. Performed squat pivot to w/c supervision. Pt transported to front entrance of hospital and participated in w/c mobility outside with uneven surfaces and slight grades. Pt able to propel approx 187f the requiring rest due to fatigue and increased pain in coccyx. Pt propelled from terrace to inside front entrance and PTA transported remaining distance back to unit. Pt performed squat pivot w/c to mat in rehab gym and participated in standing tolerance placing clothespins on basketball net. Pt required x 1 seated rest break due to head feeling "swimmy" which pt accounted to eye strain and heavy use of florescent lights. Pt indicated increase in pain at  coccyx, PTA notified nsg. Pt transported back to room and performed squat pivot to bed supervision. Pt left in bed with bed alarm on, call bell within reach and needs met.      Therapy Documentation Precautions:  Precautions Precautions: Fall Precaution Comments: fx coccyx Restrictions Weight Bearing Restrictions: Yes LLE Weight Bearing: Non weight bearing See Function Navigator for Current Functional Status.   Therapy/Group: Individual Therapy  Rosita DeChalus  Rosita DeChalus, PTA  09/14/2017, 12:30 PM

## 2017-09-14 NOTE — Progress Notes (Signed)
[place] PHYSICAL MEDICINE & REHABILITATION     PROGRESS NOTE  Subjective/Complaints:  Patient seen lying in bed this morning.  He states he slept well overnight.  He is ready to resume regular schedule for therapies today.  ROS: Denies CP, SOB, nausea, vomiting, diarrhea.  Objective: Vital Signs: Blood pressure (!) 148/77, pulse 92, temperature 99.2 F (37.3 C), temperature source Oral, resp. rate 17, height 5\' 10"  (1.778 m), weight 84.9 kg (187 lb 2.7 oz), SpO2 96 %. No results found. No results for input(s): WBC, HGB, HCT, PLT in the last 72 hours. No results for input(s): NA, K, CL, GLUCOSE, BUN, CREATININE, CALCIUM in the last 72 hours.  Invalid input(s): CO CBG (last 3)  Recent Labs    09/13/17 1638 09/13/17 2054 09/14/17 0641  GLUCAP 141* 195* 149*    Wt Readings from Last 3 Encounters:  09/09/17 84.9 kg (187 lb 2.7 oz)  09/04/17 84.8 kg (187 lb)  08/23/17 84.9 kg (187 lb 2.7 oz)    Physical Exam:  BP (!) 148/77 (BP Location: Left Arm)   Pulse 92   Temp 99.2 F (37.3 C) (Oral)   Resp 17   Ht 5\' 10"  (1.778 m)   Wt 84.9 kg (187 lb 2.7 oz)   SpO2 96%   BMI 26.86 kg/m  Constitutional: He appearswell-developedand well-nourished. NAD. HENT: Normocephalicand atraumatic.  Eyes:EOMare normal. No discharge.  Cardiovascular:RRR. No JVD. Respiratory:Effort normal and breath sounds normal.   KG:YJEHU sounds are normal. He exhibitsno distension.  Musculoskeletal: Left stump with tenderness and mild edema, improving Neurological: He isalert.  Alert and oriented. Motor: B/l UE 5/5 proximal to distal RLE: 4+/5 proximal to distal LLE: HF, KE 4+/5 (pain inhibition) Skin: Skin iswarmand dry. BKA site with dressing C/D/I Psychiatric: He has anormal mood and affect. Hisbehavior is normal.  Assessment/Plan: 1. Functional deficits secondary to Left BKA which require 3+ hours per day of interdisciplinary therapy in a comprehensive inpatient rehab  setting. Physiatrist is providing close team supervision and 24 hour management of active medical problems listed below. Physiatrist and rehab team continue to assess barriers to discharge/monitor patient progress toward functional and medical goals.  Function:  Bathing Bathing position   Position: Wheelchair/chair at sink  Bathing parts Body parts bathed by patient: Right arm, Left arm, Chest, Abdomen, Front perineal area, Buttocks, Right upper leg, Left upper leg Body parts bathed by helper: Back  Bathing assist Assist Level: Touching or steadying assistance(Pt > 75%)      Upper Body Dressing/Undressing Upper body dressing   What is the patient wearing?: Pull over shirt/dress     Pull over shirt/dress - Perfomed by patient: Thread/unthread right sleeve, Thread/unthread left sleeve, Put head through opening, Pull shirt over trunk          Upper body assist Assist Level: Touching or steadying assistance(Pt > 75%)      Lower Body Dressing/Undressing Lower body dressing   What is the patient wearing?: Underwear, Pants, Non-skid slipper socks Underwear - Performed by patient: Thread/unthread left underwear leg, Pull underwear up/down Underwear - Performed by helper: Thread/unthread right underwear leg Pants- Performed by patient: Pull pants up/down Pants- Performed by helper: Thread/unthread right pants leg, Thread/unthread left pants leg   Non-skid slipper socks- Performed by helper: Don/doff right sock                  Lower body assist Assist for lower body dressing: (Max assist)      Child psychotherapist  steps completed by patient: Adjust clothing prior to toileting, Performs perineal hygiene, Adjust clothing after toileting Toileting steps completed by helper: Performs perineal hygiene Toileting Assistive Devices: Grab bar or rail  Toileting assist Assist level: Supervision or verbal cues   Transfers Chair/bed transfer   Chair/bed transfer method:  Squat pivot Chair/bed transfer assist level: Supervision or verbal cues Chair/bed transfer assistive device: Armrests     Locomotion Ambulation     Max distance: 35 Assist level: Supervision or verbal cues   Wheelchair   Type: Manual Max wheelchair distance: 153ft  Assist Level: Supervision or verbal cues  Cognition Comprehension Comprehension assist level: Follows basic conversation/direction with no assist  Expression Expression assist level: Expresses basic needs/ideas: With no assist  Social Interaction Social Interaction assist level: Interacts appropriately with others - No medications needed.  Problem Solving Problem solving assist level: Solves basic 90% of the time/requires cueing < 10% of the time  Memory Memory assist level: Recognizes or recalls 75 - 89% of the time/requires cueing 10 - 24% of the time    Medical Problem List and Plan: 1.  Decreased functional mobility secondary to sepsis secondary to left diabetic foot ulcer status post left BKA 09/05/2017  Continue CIR 2.  DVT Prophylaxis/Anticoagulation: Subcutaneous heparin.  Monitor for any bleeding episodes 3. Pain Management: Neurontin 600 mg twice daily, Lidoderm patch, Ultram and oxycodone as needed 4. Mood: Provide emotional support 5. Neuropsych: This patient is capable of making decisions on his own behalf. 6. Skin/Wound Care: Routine skin checks 7. Fluids/Electrolytes/Nutrition: Routine in and outs  8.  Acute blood loss anemia.     Hemoglobin 9.0 on 5/30  Labs ordered for tomorrow 9.  ID/sepsis.    Cultures no growth.    Vancomycin and Zosyn changed to doxycycline completed on 6/2 10.  CKD stage III.     Creatinine 1.64 on 5/30  Labs ordered for tomorrow 11.  Diabetes mellitus with peripheral neuropathy as well as history of DKA.  Hemoglobin A1c 15.3.    Lantus insulin 50 units daily decreased to 25 on 6/2    Check blood sugars before meals and at bedtime.  Diabetic teaching   Labile on 6/3 12.   CAD with PCI stenting 2007.  Continue aspirin.  No chest pain or shortness of breath 13.  Hypertension.  Norvasc 10 mg daily   Labile on 6/3 14.  Hyperlipidemia.  Crestor 15.  BPH.  Flomax 0.4 mg daily, increased on 5/30.     PVRs suggesting urinary retention   Urecholine started on 6/2, increased to 25 mg on 6/3  LOS (Days) 5 A FACE TO FACE EVALUATION WAS PERFORMED  Ankit Lorie Phenix 09/14/2017 8:36 AM

## 2017-09-14 NOTE — Progress Notes (Signed)
Wife very concerned that pt is going to be d/c this week and feels that he is not ready to go home.  Michela Pitcher that he is very tired and will go home lay around and decline, get a UTI or an infection.  Informed that physically he is doing well, therapist aware that there are nursing issues still being addressed.  Informed that pt teaching was done today with dressing changes and was provided limb to practice with which pt was able to correctly wrap on demand and wife was at bedside at the time.  Inform that pt at this point is not ready to cath himself and that working towards that.  Informed that have really pushed pt to get up and go to bathroom to void and to take his time.  Each time pt has been able to void enough to so that PVR is under 350.  Informed that will encourage him to do this every time and even has night. Informed that will have all the teaching necessary for him to go home but it will be up to him to follow through with progress.  Informed that insurance also plays into the time allotted for stay,

## 2017-09-14 NOTE — Progress Notes (Signed)
Occupational Therapy Session Note  Patient Details  Name: Chase Clark MRN: [id number] Date of Birth: 1965-04-27  Today's Date: 09/14/2017 OT Individual Time: 4680-3212 and 2482-5003 OT Individual Time Calculation (min): 56 min and 42 min   Short Term Goals: Week 1:  OT Short Term Goal 1 (Week 1): STG = LTGs due to ELOS  Skilled Therapeutic Interventions/Progress Updates:    1) Treatment session with focus on care of residual limb.  Pt received in sidelying in bed reporting pain in buttocks.  Discussed shower from previous session with pt stating it went well. Pt deferred shower till tomorrow, stating he did not have any concerns about shower and that he had already dressed himself this AM.  Educated pt on residual limb wrapping.  Referred to handout previously provided to pt and therapist demonstrated wrapping on towel roll.  Pt completed wrapping on towel roll x2 with improved technique for 2nd attempt.  Attempted wrapping residual limb while seated EOB, however pt with increased pain in buttocks in sitting when bending forward to attempt wrapping.  Pt will require increased practice for limb wrapping.  Utilized Corporate treasurer to identify gaps in wrapping.  Therapist wrapped residual limb in figure 8 pattern while pt observed.  Discussed having wife attend therapy session to practice wrapping as well as inquiring if pt is candidate for shrinker.  Pt left in sidelying in bed with all needs in reach.  2) Treatment session with focus on bathroom transfers.  Pt received in sidelying reporting pain in buttocks but willing to complete transfers.  Pt completed squat pivot transfer bed > w/c> toilet with distant supervision.  Completed clothing management during toileting tasks without assistance.  Completed transfers with RW with min guard.  Discussed recommendation for 3 in 1 over toilet to provide UE support as pt reliant on grab bars in bathroom for controlled descent and when standing from toilet,  with pt in agreement.  Engaged in simulated walk-in shower transfer with hopping over 3" ledge with RW.  Pt able to complete forward (as he reports taking RW into shower with him at home) with min assist.  Pt propelled w/c back to room without assistance, pt noting fatigue in bilateral shoulders from w/c propulsion.  Pt returned to bed via squat pivot.  Therapy Documentation Precautions:  Precautions Precautions: Fall Precaution Comments: fx coccyx Restrictions Weight Bearing Restrictions: Yes LLE Weight Bearing: Non weight bearing Pain: Pain Assessment Pain Scale: 0-10 Pain Score: 4  Pain Type: Acute pain Pain Location: Coccyx Pain Orientation: Lower Pain Descriptors / Indicators: Aching Pain Frequency: Constant Pain Onset: On-going Pain Intervention(s): Medication (See eMAR)  See Function Navigator for Current Functional Status.   Therapy/Group: Individual Therapy  Simonne Come 09/14/2017, 9:43 AM

## 2017-09-15 ENCOUNTER — Inpatient Hospital Stay (HOSPITAL_COMMUNITY): Payer: BLUE CROSS/BLUE SHIELD | Admitting: Occupational Therapy

## 2017-09-15 ENCOUNTER — Inpatient Hospital Stay (HOSPITAL_COMMUNITY): Payer: BLUE CROSS/BLUE SHIELD

## 2017-09-15 ENCOUNTER — Encounter (HOSPITAL_COMMUNITY): Payer: Self-pay | Admitting: *Deleted

## 2017-09-15 ENCOUNTER — Inpatient Hospital Stay (HOSPITAL_COMMUNITY): Payer: BLUE CROSS/BLUE SHIELD | Admitting: Physical Therapy

## 2017-09-15 ENCOUNTER — Other Ambulatory Visit: Payer: Self-pay

## 2017-09-15 DIAGNOSIS — D72829 Elevated white blood cell count, unspecified: Secondary | ICD-10-CM

## 2017-09-15 LAB — BASIC METABOLIC PANEL
Anion gap: 9 (ref 5–15)
BUN: 13 mg/dL (ref 6–20)
CO2: 29 mmol/L (ref 22–32)
Calcium: 8.8 mg/dL — ABNORMAL LOW (ref 8.9–10.3)
Chloride: 98 mmol/L — ABNORMAL LOW (ref 101–111)
Creatinine, Ser: 1.79 mg/dL — ABNORMAL HIGH (ref 0.61–1.24)
GFR calc Af Amer: 48 mL/min — ABNORMAL LOW (ref >60)
GFR calc non Af Amer: 41 mL/min — ABNORMAL LOW (ref >60)
Glucose, Bld: 255 mg/dL — ABNORMAL HIGH (ref 65–99)
Potassium: 4.1 mmol/L (ref 3.5–5.1)
Sodium: 136 mmol/L (ref 135–145)

## 2017-09-15 LAB — CBC WITH DIFFERENTIAL/PLATELET
Abs Immature Granulocytes: 0.1 10*3/uL (ref 0.0–0.1)
Basophils Absolute: 0.1 10*3/uL (ref 0.0–0.1)
Basophils Relative: 1 %
Eosinophils Absolute: 0.5 10*3/uL (ref 0.0–0.7)
Eosinophils Relative: 4 %
HCT: 27.5 % — ABNORMAL LOW (ref 39.0–52.0)
Hemoglobin: 8.7 g/dL — ABNORMAL LOW (ref 13.0–17.0)
Immature Granulocytes: 1 %
Lymphocytes Relative: 14 %
Lymphs Abs: 1.7 10*3/uL (ref 0.7–4.0)
MCH: 27.6 pg (ref 26.0–34.0)
MCHC: 31.6 g/dL (ref 30.0–36.0)
MCV: 87.3 fL (ref 78.0–100.0)
Monocytes Absolute: 0.6 10*3/uL (ref 0.1–1.0)
Monocytes Relative: 5 %
Neutro Abs: 8.6 10*3/uL — ABNORMAL HIGH (ref 1.7–7.7)
Neutrophils Relative %: 75 %
Platelets: 387 10*3/uL (ref 150–400)
RBC: 3.15 MIL/uL — ABNORMAL LOW (ref 4.22–5.81)
RDW: 13.3 % (ref 11.5–15.5)
WBC: 11.5 10*3/uL — ABNORMAL HIGH (ref 4.0–10.5)

## 2017-09-15 LAB — GLUCOSE, CAPILLARY
Glucose-Capillary: 244 mg/dL — ABNORMAL HIGH (ref 65–99)
Glucose-Capillary: 201 mg/dL — ABNORMAL HIGH (ref 65–99)
Glucose-Capillary: 175 mg/dL — ABNORMAL HIGH (ref 65–99)
Glucose-Capillary: 192 mg/dL — ABNORMAL HIGH (ref 65–99)

## 2017-09-15 MED ORDER — BETHANECHOL CHLORIDE 25 MG PO TABS
50.0000 mg | ORAL_TABLET | Freq: Three times a day (TID) | ORAL | Status: DC
  Administered 2017-09-15 – 2017-09-17 (×6): 50 mg via ORAL
  Filled 2017-09-15 (×6): qty 2

## 2017-09-15 MED ORDER — INSULIN GLARGINE 100 UNIT/ML ~~LOC~~ SOLN
25.0000 [IU] | Freq: Every day | SUBCUTANEOUS | Status: DC
  Administered 2017-09-16 – 2017-09-17 (×2): 25 [IU] via SUBCUTANEOUS
  Filled 2017-09-15 (×2): qty 0.25

## 2017-09-15 MED ORDER — INSULIN GLARGINE 100 UNIT/ML ~~LOC~~ SOLN: 12.0000 [IU] | Freq: Once | SUBCUTANEOUS | Status: DC

## 2017-09-15 MED ORDER — INSULIN GLARGINE 100 UNIT/ML ~~LOC~~ SOLN: 37.0000 [IU] | Freq: Every day | SUBCUTANEOUS | Status: DC

## 2017-09-15 MED ORDER — INSULIN GLARGINE 100 UNIT/ML ~~LOC~~ SOLN
12.0000 [IU] | Freq: Every day | SUBCUTANEOUS | Status: DC
  Administered 2017-09-15 – 2017-09-16 (×2): 12 [IU] via SUBCUTANEOUS
  Filled 2017-09-15 (×2): qty 0.12

## 2017-09-15 NOTE — Progress Notes (Signed)
Occupational Therapy Note  Patient Details  Name: Chase Clark MRN: [id number] Date of Birth: 1965-08-24  Today's Date: 09/15/2017 OT Individual Time: 1030-1100 OT Individual Time Calculation (min): 30 min   Pt denies pain Individual Therapy  OT intervention with focus on w/c mobility and BUE therex with 6# ball (chest presses and shoulder presses-3X15 each). Pt propelled from room<>gym at supervision level.  Pt performed bed<>w/c transfers at supervision level.  Pt returned to bed with all needs within reach.    Leotis Shames [facility] 09/15/2017, 12:56 PM

## 2017-09-15 NOTE — Progress Notes (Signed)
Physical Therapy Session Note  Patient Details  Name: Chase Clark MRN: [id number] Date of Birth: 1966/01/11  Today's Date: 09/15/2017 PT Individual Time: 1130-1200 PT Individual Time Calculation (min): 30 min   Short Term Goals: Week 1:  PT Short Term Goal 1 (Week 1): STG=LTG due to ELOS   Skilled Therapeutic Interventions/Progress Updates:    Pt supine in bed upon PT arrival, agreeable to therapy tx and denies pain. Pt transferred to sitting EOB Mod I and squat pivot to w/c with supervision. Pt propelled w/c x 100 ft with B UEs and supervision, limited by fatigue and therapist transported the rest of the way. Pt states he would like to learn how to get up off the floor if he falls. Therapist demonstrated technique to scoot up to mat and then either push up all the way onto mat using B UEs/R LE or educated pt to place cushion or pillows on floor to build a step and then push up onto mat. Pt transferred to mat squat pivot with supervision. Pt transferred from mat>step>floor with min assist and verbal cues for techniques and then back up from mat>step>mat with verbal cues for techniques. Pt educated on precautions to take if he does fall at home. Pt transferred back to w/c and propelled back to room, transferred to bed and left supine with needs in reach.   Therapy Documentation Precautions:  Precautions Precautions: Fall Precaution Comments: fx coccyx Restrictions Weight Bearing Restrictions: Yes LLE Weight Bearing: Non weight bearing   See Function Navigator for Current Functional Status.   Therapy/Group: Individual Therapy  Netta Corrigan, PT, DPT 09/15/2017, 7:57 AM

## 2017-09-15 NOTE — Progress Notes (Signed)
[place] PHYSICAL MEDICINE & REHABILITATION     PROGRESS NOTE  Subjective/Complaints:  Patient seen lying in bed this morning. He sits well overnight. He notes improved urinary function  ROS: denies CP, SOB, nausea, vomiting, diarrhea.  Objective: Vital Signs: Blood pressure 131/84, pulse 84, temperature 99 F (37.2 C), temperature source Oral, resp. rate 12, height 5\' 10"  (1.778 m), weight 84.9 kg (187 lb 2.7 oz), SpO2 98 %. No results found. Recent Labs    09/15/17 0532  WBC 11.5*  HGB 8.7*  HCT 27.5*  PLT 387   Recent Labs    09/15/17 0532  NA 136  K 4.1  CL 98*  GLUCOSE 255*  BUN 13  CREATININE 1.79*  CALCIUM 8.8*   CBG (last 3)  Recent Labs    09/14/17 1623 09/14/17 2116 09/15/17 0641  GLUCAP 161* 212* 244*    Wt Readings from Last 3 Encounters:  09/09/17 84.9 kg (187 lb 2.7 oz)  09/04/17 84.8 kg (187 lb)  08/23/17 84.9 kg (187 lb 2.7 oz)    Physical Exam:  BP 131/84 (BP Location: Right Arm)   Pulse 84   Temp 99 F (37.2 C) (Oral)   Resp 12   Ht 5\' 10"  (1.778 m)   Wt 84.9 kg (187 lb 2.7 oz)   SpO2 98%   BMI 26.86 kg/m  Constitutional: He appearswell-developedand well-nourished. NAD. HENT: Normocephalicand atraumatic.  Eyes:EOMare normal. No discharge.  Cardiovascular:RRR. No JVD. Respiratory:Effort normal and breath sounds normal.   YK:ZLDJT sounds are normal. He exhibitsno distension.  Musculoskeletal: Left stump with tenderness and mild edema, improving Neurological: He isalert.  Alert and oriented. Motor: B/l UE 5/5 proximal to distal RLE: 4+/5 proximal to distal LLE: HF, KE 4+-5/5 (pain inhibition) Skin: Skin iswarmand dry. BKA site with dressing C/D/I Psychiatric: He has anormal mood and affect. Hisbehavior is normal.  Assessment/Plan: 1. Functional deficits secondary to Left BKA which require 3+ hours per day of interdisciplinary therapy in a comprehensive inpatient rehab setting. Physiatrist is providing  close team supervision and 24 hour management of active medical problems listed below. Physiatrist and rehab team continue to assess barriers to discharge/monitor patient progress toward functional and medical goals.  Function:  Bathing Bathing position   Position: Wheelchair/chair at sink  Bathing parts Body parts bathed by patient: Right arm, Left arm, Chest, Abdomen, Front perineal area, Buttocks, Right upper leg, Left upper leg Body parts bathed by helper: Back  Bathing assist Assist Level: Touching or steadying assistance(Pt > 75%)      Upper Body Dressing/Undressing Upper body dressing   What is the patient wearing?: Pull over shirt/dress     Pull over shirt/dress - Perfomed by patient: Thread/unthread right sleeve, Thread/unthread left sleeve, Put head through opening, Pull shirt over trunk          Upper body assist Assist Level: More than reasonable time(per report)      Lower Body Dressing/Undressing Lower body dressing   What is the patient wearing?: Underwear, Pants, Non-skid slipper socks Underwear - Performed by patient: Thread/unthread left underwear leg, Pull underwear up/down, Thread/unthread right underwear leg Underwear - Performed by helper: Thread/unthread right underwear leg Pants- Performed by patient: Thread/unthread left pants leg, Pull pants up/down, Thread/unthread right pants leg Pants- Performed by helper: Thread/unthread right pants leg, Thread/unthread left pants leg Non-skid slipper socks- Performed by patient: Don/doff right sock Non-skid slipper socks- Performed by helper: Don/doff right sock  Lower body assist Assist for lower body dressing: Set up(per report)      Toileting Toileting   Toileting steps completed by patient: Adjust clothing prior to toileting, Adjust clothing after toileting, Performs perineal hygiene Toileting steps completed by helper: Performs perineal hygiene Toileting Assistive Devices: Grab bar  or rail  Toileting assist Assist level: Supervision or verbal cues   Transfers Chair/bed transfer   Chair/bed transfer method: Squat pivot Chair/bed transfer assist level: Supervision or verbal cues Chair/bed transfer assistive device: Armrests     Locomotion Ambulation     Max distance: 35 Assist level: Supervision or verbal cues   Wheelchair   Type: Manual Max wheelchair distance: 174ft  Assist Level: Supervision or verbal cues  Cognition Comprehension Comprehension assist level: Understands basic 90% of the time/cues < 10% of the time  Expression Expression assist level: Expresses basic 90% of the time/requires cueing < 10% of the time.  Social Interaction Social Interaction assist level: Interacts appropriately 90% of the time - Needs monitoring or encouragement for participation or interaction.  Problem Solving Problem solving assist level: Solves basic 90% of the time/requires cueing < 10% of the time  Memory Memory assist level: Complete Independence: No helper    Medical Problem List and Plan: 1.  Decreased functional mobility secondary to sepsis secondary to left diabetic foot ulcer status post left BKA 09/05/2017  Continue CIR   Stump shrinker ordered 2.  DVT Prophylaxis/Anticoagulation: Subcutaneous heparin.  Monitor for any bleeding episodes 3. Pain Management: Neurontin 600 mg twice daily, Lidoderm patch, Ultram and oxycodone as needed 4. Mood: Provide emotional support 5. Neuropsych: This patient is capable of making decisions on his own behalf. 6. Skin/Wound Care: Routine skin checks 7. Fluids/Electrolytes/Nutrition: Routine in and outs  8.  Acute blood loss anemia.     Hemoglobin 8.7 on 6/4 9.  ID/sepsis.    Cultures no growth.    Vancomycin and Zosyn changed to doxycycline completed on 6/2 10.  CKD stage III.     Creatinine 1.79 on 6/4 11.  Diabetes mellitus with peripheral neuropathy as well as history of DKA.  Hemoglobin A1c 15.3.    Lantus insulin 50  units daily decreased to 25 on 6/2, added 12 units daily at bedtime on 6/4    Check blood sugars before meals and at bedtime.  Diabetic teaching   Trending up 12.  CAD with PCI stenting 2007.  Continue aspirin.  No chest pain or shortness of breath 13.  Hypertension.  Norvasc 10 mg daily   Controlled on 6/4 14.  Hyperlipidemia.  Crestor 15.  BPH.  Flomax 0.4 mg daily, increased on 5/30.     PVRs suggesting urinary retention   Urecholine started on 6/2, increased to 25 mg on 6/3, increased to 50 on 6/4 16. Leukocytosis  WBCs 11.5 on 6/4  Afebrile  Last urine culture with multiple species, will recollection  LOS (Days) 6 A FACE TO FACE EVALUATION WAS PERFORMED  Ankit Lorie Phenix 09/15/2017 9:03 AM

## 2017-09-15 NOTE — Progress Notes (Signed)
Occupational Therapy Session Note  Patient Details  Name: Chase Clark MRN: [id number] Date of Birth: 1965/06/17  Today's Date: 09/15/2017 OT Individual Time: 2683-4196 and 1400-1445 OT Individual Time Calculation (min): 55 min and 45 min   Short Term Goals: Week 1:  OT Short Term Goal 1 (Week 1): STG = LTGs due to ELOS  Skilled Therapeutic Interventions/Progress Updates:    1) Treatment session with focus on ADL retraining with functional transfers and bathing/dressing.  Pt received in sidelying in bed reporting "sore" from yesterday.  Pt willing to engage in bathing at shower level.  Ambulated to room shower with RW and close supervision.  Completed bathing at seated level with lateral leans to wash buttocks.  Ambulated back to room to complete dressing from EOB and bed level for LB dressing.  Min assist for ambulation when returning due to increased instability post shower.  Pt completed LB dressing at bed level due to pain in buttocks when weight shifting in sitting to don shorts.  Therapist rewrapped residual limb in figure 8 pattern with pt observing technique.  Pt left in sidelying for pain management.  2) Treatment session with focus on LLE strengthening and maintaining ROM.  Pt completed bed > w/c > therapy mat transfers without assist.  Engaged in LLE exercises in supine and sidelying with focus on strengthening and maintaining ROM to prepare for prosthesis.  Therapist demonstrated each exercise as well as provided pt with HEP handout to continue to complete exercises at bed level during remainder of stay and once discharged.  Pt able to complete 2 sets of 10 of each exercise with focus on hip flexion/extension/abduction and hamstring stretch.  Pt passed off to PTA.  Therapy Documentation Precautions:  Precautions Precautions: Fall Precaution Comments: fx coccyx Restrictions Weight Bearing Restrictions: Yes LLE Weight Bearing: Non weight bearing General:   Vital Signs: Therapy  Vitals Pulse Rate: 84 Resp: 12 BP: 131/84 Patient Position (if appropriate): Lying Oxygen Therapy SpO2: 98 % Pain:  Pt with c/o pain in buttocks, not rated.  Premedicated.  See Function Navigator for Current Functional Status.   Therapy/Group: Individual Therapy  Simonne Come 09/15/2017, 9:45 AM

## 2017-09-15 NOTE — Progress Notes (Signed)
Orthopedic Tech Progress Note Patient Details:  Chase Clark [birth year] [id number]  Patient ID: Chase Clark, male   DOB: August 12, 1965, 52 y.o.   MRN: [id number]   Hildred Priest 09/15/2017, 10:26 AM Called in [employer] brace order; spoke with Sharyn Lull

## 2017-09-15 NOTE — Progress Notes (Signed)
Physical Therapy Session Note  Patient Details  Name: Chase Clark MRN: [id number] Date of Birth: 02/18/1966  Today's Date: 09/15/2017 PT Individual Time: 1445-1530 PT Individual Time Calculation (min): 45 min   Short Term Goals: Week 1:  PT Short Term Goal 1 (Week 1): STG=LTG due to ELOS   Skilled Therapeutic Interventions/Progress Updates: Pt presented on mat hand off from OT agreeable to therapy. Pt session focused on functional mobility and activity tolerance. Provided pt edu on w/c bumps as pt has 2 steps to enter home without rails. PTA advised best practice to have with 2 person assist. Per pt will be able to have additional person to help when needed. Pt performed squat pivot to w/c and propelled to stairs. Pt able to perform transfer to stairs and was able to bump up/down x 1 step, pt unable to perform additional step due to pain in coccyx. Pt returned to w/c min guard and propelled to parallel bars. Performed step ups to 6in step 2 x 5 with seated rest in between. Pt propelled back to room and returned to bed via squat pivot and supervision. Pt remained in bed at end of session with call bell within reach and needs met.      Therapy Documentation Precautions:  Precautions Precautions: Fall Precaution Comments: fx coccyx Restrictions Weight Bearing Restrictions: Yes LLE Weight Bearing: Non weight bearing General:   Vital Signs: Therapy Vitals Temp: 98.5 F (36.9 C) Temp Source: Oral Pulse Rate: (!) 101 Resp: (!) 22 BP: (!) 143/77 Patient Position (if appropriate): Lying Oxygen Therapy SpO2: 100 % O2 Device: Room Air  See Function Navigator for Current Functional Status.   Therapy/Group: Individual Therapy  Rosita DeChalus  Rosita DeChalus, PTA  09/15/2017, 4:30 PM  

## 2017-09-16 ENCOUNTER — Inpatient Hospital Stay (HOSPITAL_COMMUNITY): Payer: BLUE CROSS/BLUE SHIELD | Admitting: Physical Therapy

## 2017-09-16 ENCOUNTER — Telehealth: Payer: Self-pay | Admitting: Family Medicine

## 2017-09-16 ENCOUNTER — Inpatient Hospital Stay (HOSPITAL_COMMUNITY): Payer: BLUE CROSS/BLUE SHIELD

## 2017-09-16 ENCOUNTER — Inpatient Hospital Stay (HOSPITAL_COMMUNITY): Payer: BLUE CROSS/BLUE SHIELD | Admitting: Occupational Therapy

## 2017-09-16 ENCOUNTER — Encounter (HOSPITAL_COMMUNITY): Payer: BLUE CROSS/BLUE SHIELD | Admitting: Psychology

## 2017-09-16 DIAGNOSIS — E1139 Type 2 diabetes mellitus with other diabetic ophthalmic complication: Secondary | ICD-10-CM

## 2017-09-16 DIAGNOSIS — IMO0002 Reserved for concepts with insufficient information to code with codable children: Secondary | ICD-10-CM

## 2017-09-16 DIAGNOSIS — E1165 Type 2 diabetes mellitus with hyperglycemia: Principal | ICD-10-CM

## 2017-09-16 LAB — GLUCOSE, CAPILLARY
Glucose-Capillary: 189 mg/dL — ABNORMAL HIGH (ref 65–99)
Glucose-Capillary: 227 mg/dL — ABNORMAL HIGH (ref 65–99)
Glucose-Capillary: 170 mg/dL — ABNORMAL HIGH (ref 65–99)
Glucose-Capillary: 372 mg/dL — ABNORMAL HIGH (ref 65–99)

## 2017-09-16 NOTE — Progress Notes (Signed)
Physical Therapy Discharge Summary  Patient Details  Name: Chase Clark MRN: [id number] Date of Birth: [birth year]  Today's Date: 09/16/2017    Patient has met 12 of 12 long term goals due to improved activity tolerance, improved balance, increased strength, decreased pain and ability to compensate for deficits.  Patient to discharge at a wheelchair level Modified Independent.   Patient's care partner is independent to provide the necessary physical assistance at discharge.  Reasons goals not met: all PT goals met   Recommendation:  Patient will benefit from ongoing skilled PT services in outpatient setting to continue to advance safe functional mobility, address ongoing impairments in gait, safety, transfers, and minimize fall risk.  Equipment: WC, RW  Reasons for discharge: treatment goals met and discharge from hospital  Patient/family agrees with progress made and goals achieved: Yes  PT Discharge Precautions/Restrictions Restrictions Weight Bearing Restrictions: Yes LLE Weight Bearing: Non weight bearing   Pain Pain Assessment Pain Scale: 0-10 Pain Score: 3  Pain Type: Acute pain Pain Location: Coccyx Pain Intervention(s): Medication (See eMAR) Vision/Perception    baseline visual deficits. No change.  Cognition Overall Cognitive Status: Within Functional Limits for tasks assessed Arousal/Alertness: Awake/alert Orientation Level: Oriented X4 Attention: Selective Selective Attention: Appears intact Memory: Appears intact Awareness: Appears intact Problem Solving: Appears intact Safety/Judgment: Appears intact Sensation Coordination Fine Motor Movements are Fluid and Coordinated: Yes Finger Nose Finger Test: [facility] Motor  Motor Motor: Within Functional Limits Motor - Skilled Clinical Observations: WFL  Mobility Bed Mobility Bed Mobility: Rolling Right;Rolling Left;Supine to Sit;Sit to Supine Rolling Right: Independent Rolling Left: Independent Supine to  Sit: Independent Sit to Supine: Independent Transfers Sit to Stand: Independent with assistive device Stand Pivot Transfers: Independent with assistive device Squat Pivot Transfers: Independent with assistive device Locomotion  Gait Gait Assistance: Independent with assistive device Ambulation Distance (Feet): 50 Feet Gait Gait: Yes Gait Pattern: Impaired Stairs / Additional Locomotion Stairs: Yes  Trunk/Postural Assessment  Cervical Assessment Cervical Assessment: Within Functional Limits Thoracic Assessment Thoracic Assessment: Within Functional Limits Lumbar Assessment Lumbar Assessment: Within Functional Limits Postural Control Postural Control: Within Functional Limits  Balance Balance Balance Assessed: Yes Static Sitting Balance Static Sitting - Level of Assistance: 6: Modified independent (Device/Increase time) Dynamic Sitting Balance Dynamic Sitting - Level of Assistance: 5: Stand by assistance Static Standing Balance Static Standing - Balance Support: No upper extremity supported;During functional activity Static Standing - Level of Assistance: 6: Modified independent (Device/Increase time) Dynamic Standing Balance Dynamic Standing - Balance Support: Bilateral upper extremity supported Dynamic Standing - Level of Assistance: 5: Stand by assistance Extremity Assessment      RLE Assessment RLE Assessment: Within Functional Limits LLE Assessment LLE Assessment: Exceptions to [facility] LLE Strength LLE Overall Strength Comments: 4+/5 hip flexion/ext,    See Function Navigator for Current Functional Status.  Rosita DeChalus 09/16/2017, 9:46 AM

## 2017-09-16 NOTE — Progress Notes (Signed)
[place] PHYSICAL MEDICINE & REHABILITATION     PROGRESS NOTE  Subjective/Complaints:  Patient seen lying in bed this morning. He states he slept fairly overnight. He continues to note improvement in bladder function.  ROS: denies CP, SOB, nausea, vomiting, diarrhea.  Objective: Vital Signs: Blood pressure (!) 151/81, pulse 86, temperature 98.8 F (37.1 C), resp. rate 20, height 5\' 10"  (1.778 m), weight 84.9 kg (187 lb 2.7 oz), SpO2 96 %. No results found. Recent Labs    09/15/17 0532  WBC 11.5*  HGB 8.7*  HCT 27.5*  PLT 387   Recent Labs    09/15/17 0532  NA 136  K 4.1  CL 98*  GLUCOSE 255*  BUN 13  CREATININE 1.79*  CALCIUM 8.8*   CBG (last 3)  Recent Labs    09/15/17 1643 09/15/17 2113 09/16/17 0636  GLUCAP 175* 192* 189*    Wt Readings from Last 3 Encounters:  09/09/17 84.9 kg (187 lb 2.7 oz)  09/04/17 84.8 kg (187 lb)  08/23/17 84.9 kg (187 lb 2.7 oz)    Physical Exam:  BP (!) 151/81 (BP Location: Left Arm)   Pulse 86   Temp 98.8 F (37.1 C)   Resp 20   Ht 5\' 10"  (1.778 m)   Wt 84.9 kg (187 lb 2.7 oz)   SpO2 96%   BMI 26.86 kg/m  Constitutional: He appearswell-developedand well-nourished. NAD. HENT: Normocephalicand atraumatic.  Eyes:EOMare normal. No discharge.  Cardiovascular:RRR. No JVD. Respiratory:Effort normal and breath sounds normal.   ZO:XWRUE sounds are normal. He exhibitsno distension.  Musculoskeletal: Left stump with tenderness and mild edema, improving Neurological: He isalert.  Alert and oriented. Motor: B/l UE 5/5 proximal to distal RLE: 4+/5 proximal to distal LLE: HF, KE 4+-5/5 (pain inhibition, improving) Skin: Skin iswarmand dry. BKA site with dressing C/D/I Psychiatric: He has anormal mood and affect. Hisbehavior is normal.  Assessment/Plan: 1. Functional deficits secondary to Left BKA which require 3+ hours per day of interdisciplinary therapy in a comprehensive inpatient rehab  setting. Physiatrist is providing close team supervision and 24 hour management of active medical problems listed below. Physiatrist and rehab team continue to assess barriers to discharge/monitor patient progress toward functional and medical goals.  Function:  Bathing Bathing position   Position: Shower  Bathing parts Body parts bathed by patient: Right arm, Left arm, Chest, Abdomen, Front perineal area, Buttocks, Right upper leg, Left upper leg, Right lower leg, Back Body parts bathed by helper: Back  Bathing assist Assist Level: Set up   Set up : To obtain items  Upper Body Dressing/Undressing Upper body dressing   What is the patient wearing?: Pull over shirt/dress     Pull over shirt/dress - Perfomed by patient: Thread/unthread right sleeve, Thread/unthread left sleeve, Put head through opening, Pull shirt over trunk          Upper body assist Assist Level: More than reasonable time      Lower Body Dressing/Undressing Lower body dressing   What is the patient wearing?: Underwear, Pants, Non-skid slipper socks, Socks, Shoes Underwear - Performed by patient: Thread/unthread left underwear leg, Pull underwear up/down, Thread/unthread right underwear leg Underwear - Performed by helper: Thread/unthread right underwear leg Pants- Performed by patient: Thread/unthread left pants leg, Pull pants up/down, Thread/unthread right pants leg Pants- Performed by helper: Thread/unthread right pants leg, Thread/unthread left pants leg Non-skid slipper socks- Performed by patient: Don/doff right sock Non-skid slipper socks- Performed by helper: Don/doff right sock Socks - Performed by patient:  Don/doff right sock   Shoes - Performed by patient: Don/doff right shoe, Fasten right            Lower body assist Assist for lower body dressing: Set up      Toileting Toileting   Toileting steps completed by patient: Adjust clothing prior to toileting, Adjust clothing after toileting,  Performs perineal hygiene Toileting steps completed by helper: Performs perineal hygiene Toileting Assistive Devices: Grab bar or rail  Toileting assist Assist level: More than reasonable time   Transfers Chair/bed transfer   Chair/bed transfer method: Squat pivot Chair/bed transfer assist level: No Help, no cues, assistive device, takes more than a reasonable amount of time Chair/bed transfer assistive device: Armrests     Locomotion Ambulation     Max distance: 35 Assist level: Supervision or verbal cues   Wheelchair   Type: Manual Max wheelchair distance: 188ft  Assist Level: Supervision or verbal cues  Cognition Comprehension Comprehension assist level: Follows complex conversation/direction with no assist  Expression Expression assist level: Expresses complex 90% of the time/cues < 10% of the time  Social Interaction Social Interaction assist level: Interacts appropriately with others with medication or extra time (anti-anxiety, antidepressant).  Problem Solving Problem solving assist level: Solves basic problems with no assist  Memory Memory assist level: Complete Independence: No helper    Medical Problem List and Plan: 1.  Decreased functional mobility secondary to sepsis secondary to left diabetic foot ulcer status post left BKA 09/05/2017  Continue CIR   Team conference today to discuss current and goals and coordination of care, home and environmental barriers, and discharge planning with nursing, case manager, and therapies.    Stump shrinker ordered 2.  DVT Prophylaxis/Anticoagulation: Subcutaneous heparin.  Monitor for any bleeding episodes 3. Pain Management: Neurontin 600 mg twice daily, Lidoderm patch, Ultram and oxycodone as needed 4. Mood: Provide emotional support 5. Neuropsych: This patient is capable of making decisions on his own behalf. 6. Skin/Wound Care: Routine skin checks 7. Fluids/Electrolytes/Nutrition: Routine in and outs  8.  Acute blood loss  anemia.     Hemoglobin 8.7 on 6/4 9.  ID/sepsis.    Cultures no growth.    Vancomycin and Zosyn changed to doxycycline completed on 6/2 10.  CKD stage III.     Creatinine 1.79 on 6/4 11.  Diabetes mellitus with peripheral neuropathy as well as history of DKA.  Hemoglobin A1c 15.3.    Lantus insulin 50 units daily decreased to 25 on 6/2, added 12 units daily at bedtime on 6/4    Check blood sugars before meals and at bedtime.  Diabetic teaching   Elevated on 6/5 12.  CAD with PCI stenting 2007.  Continue aspirin.  No chest pain or shortness of breath 13.  Hypertension.  Norvasc 10 mg daily   ? Trending up on 6/5 14.  Hyperlipidemia.  Crestor 15.  BPH.  Flomax 0.4 mg daily, increased on 5/30.     PVRs suggesting urinary retention   Urecholine started on 6/2, increased to 25 mg on 6/3, increased to 50 on 6/4  Improving 16. Leukocytosis  WBCs 11.5 on 6/4  Afebrile  Last urine culture with multiple species, recollection pending  LOS (Days) 7 A FACE TO FACE EVALUATION WAS PERFORMED  Lynise Porr Lorie Phenix 09/16/2017 8:57 AM

## 2017-09-16 NOTE — Telephone Encounter (Signed)
The form appears to be for an insulin pump. I ordered a continuous glucose meter and did not order an insulin pump. Please contact the rep to determine what the form is for. If it is for an insulin pump the patient will need to see endocrinology.

## 2017-09-16 NOTE — Progress Notes (Signed)
Occupational Therapy Discharge Summary  Patient Details  Name: Chase Clark MRN: [id number] Date of Birth: 1965-04-27  Patient has met 10 of 10 long term goals due to improved activity tolerance, improved balance, postural control and ability to compensate for deficits.  Patient to discharge at overall Modified Independent level, supervision with shower transfers and bathing at shower level.  Patient's care partner was not present for any formal education, but due to primarily Mod I goals and pt reporting ability to complete transfers with Supervision when had foot surgery, wife should be able to provide the necessary supervision assistance with shower transfers at discharge.    Reasons goals not met: N/A  Recommendation:  Patient will not require follow up OT at this time.  Pt has been provided with HEP for strengthening and continued maintenance of current ROM.  Equipment: 3 in 1 and family to purchase padded tub bench  Reasons for discharge: treatment goals met and discharge from hospital  Patient/family agrees with progress made and goals achieved: Yes  OT Discharge Precautions/Restrictions  Precautions Precautions: Fall Precaution Comments: fx coccyx Restrictions Weight Bearing Restrictions: Yes LLE Weight Bearing: Non weight bearing General   Vital Signs Therapy Vitals Temp: 98.5 F (36.9 C) Temp Source: Oral Pulse Rate: 99 Resp: 16 BP: 135/77 Patient Position (if appropriate): Lying Oxygen Therapy SpO2: 100 % O2 Device: Room Air Pain  Pt reports pain level 5 in Coccyx.  Premedicated ADL  See Function Navigator Vision Baseline Vision/History: Wears glasses Wears Glasses: Reading only Patient Visual Report: Blurring of vision Vision Assessment?: No apparent visual deficits Perception  Perception: Within Functional Limits Praxis Praxis: Intact Cognition Overall Cognitive Status: Within Functional Limits for tasks assessed Arousal/Alertness:  Awake/alert Orientation Level: Oriented X4 Attention: Selective Selective Attention: Appears intact Memory: Appears intact Awareness: Appears intact Problem Solving: Appears intact Safety/Judgment: Appears intact Sensation Sensation Light Touch: Impaired Detail Light Touch Impaired Details: Impaired RLE;Impaired LLE Proprioception: Appears Intact Coordination Fine Motor Movements are Fluid and Coordinated: Yes Finger Nose Finger Test: [facility] Extremity/Trunk Assessment RUE Assessment RUE Assessment: Within Functional Limits LUE Assessment LUE Assessment: Within Functional Limits   See Function Navigator for Current Functional Status.  Simonne Come 09/16/2017, 2:11 PM

## 2017-09-16 NOTE — Telephone Encounter (Signed)
Chase Clark from [employer] dropped off a form to fill out. Paper work is up front in [employer], research (life sciences).

## 2017-09-16 NOTE — Telephone Encounter (Signed)
Left message to return call, ok for pec to speak to patients wife about message below

## 2017-09-16 NOTE — Progress Notes (Signed)
Occupational Therapy Session Note  Patient Details  Name: Chase Clark MRN: [id number] Date of Birth: 1965-07-23  Today's Date: 09/16/2017 OT Individual Time: 0730-0830 OT Individual Time Calculation (min): 60 min    Short Term Goals: Week 1:  OT Short Term Goal 1 (Week 1): STG = LTGs due to ELOS  Skilled Therapeutic Interventions/Progress Updates:    Completed ADL retraining at overall Mod-I to supervision level.  Pt ambulated to room shower as he reports that he will utilize RW to complete shower transfers at home.  Completed transfer with supervision with RW.  Engaged in discussion regarding recommendation for supervision with shower transfers and bathing at shower level with pt in agreement.  Pt completed bathing with setup, washing buttocks with lateral leans.  Pt returned to room ambulating with RW with supervision.  Completed LB dressing at bed level due to pain in coccyx.  Engaged in discussion regarding use of HEP for UE strengthening and LLE strengthening and maintenance of ROM to prepare for prosthesis.  Pt reports understanding of all recommendations and reports no further questions.  Pt left in sidelying in bed with all needs in reach.  Therapy Documentation Precautions:  Precautions Precautions: Fall Precaution Comments: fx coccyx Restrictions Weight Bearing Restrictions: Yes LLE Weight Bearing: Non weight bearing Pain: Pain Assessment Pain Scale: 0-10 Pain Score: 3  Pain Type: Acute pain Pain Location: Coccyx Pain Intervention(s): Medication (See eMAR)  See Function Navigator for Current Functional Status.   Therapy/Group: Individual Therapy  Simonne Come 09/16/2017, 9:32 AM

## 2017-09-16 NOTE — Discharge Summary (Signed)
NAME: Chase Clark, Chase Clark MEDICAL RECORD [id number] ACCOUNT [id number] DATE OF BIRTH:Aug 24, 1965 FACILITY: [facility] LOCATION: [facility] PHYSICIAN:ANKIT PATEL, MD  DISCHARGE SUMMARY  DATE OF DISCHARGE:  09/17/2017  DATE OF ADMISSION:  09/09/2017  DATE OF DISCHARGE:  09/17/2017  DISCHARGE DIAGNOSES: 1.  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 2.  Subcutaneous heparin for deep vein thrombosis prophylaxis.   3.  Pain management.  4.  Chronic kidney disease, stage III. 5.  Diabetes mellitus. 6.  Peripheral neuropathy. 7.  Coronary artery disease with stenting. 8.  Hypertension. 9.  Hyperlipidemia. 10.  Benign prostatic hypertrophy.  HOSPITAL COURSE:  This is a 52 year old right-handed male with a history of CAD with stenting, diabetes mellitus, CKD stage III as well as chronic left foot ulcer followed at the wound center in [facility] with frequent debridements.  Lives with spouse.   Presented 09/04/2017 with generalized weakness.  The patient with recent admission 1 week ago for DKA at [facility], discharged to home.  Chest x-ray negative.  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  MRI of the lumbar  spine unremarkable.  WBC 16,700.  Lactic acid 4.4.  Suspect sepsis related to left foot.  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 [name].  HOSPITAL COURSE:  Pain management.  Subcutaneous heparin for DVT prophylaxis.  Acute on chronic anemia 9.1.  Initially placed on vancomycin and Zosyn for sepsis.  Cultures negative.  Changed to doxycycline and completed course.  The patient was admitted  for comprehensive rehabilitation program.  PAST MEDICAL HISTORY:  See discharge diagnoses.  SOCIAL HISTORY:  Lives with spouse.  FUNCTIONAL STATUS:  Upon admission to rehab services was minimal assist, stand pivot transfers, minimal assist sit to supine, min/mod assist with activities of daily living.  PHYSICAL EXAMINATION: VITAL SIGNS:  Blood pressure 128/87, pulse 94,  temperature 99, respirations 18. GENERAL:  Alert male in no acute distress.  EOMs intact. NECK:  Supple, nontender.  No JVD. CARDIOVASCULAR:  Rate controlled. ABDOMEN:  Soft, nontender, good bowel sounds. LUNGS:  Clear to auscultation without wheeze.   EXTREMITIES:  BKA site was dressed, appropriately tender.  REHABILITATION HOSPITAL COURSE:  The patient was admitted to inpatient rehabilitation services.  Therapies initiated on a 3-hour daily basis, consisting of physical therapy, occupational therapy and rehabilitation nursing.  The following issues were  addressed during the patient's rehabilitation stay.  Pertaining to the patient's left below-knee amputation, surgical site healing nicely.  Stump shrinker was ordered.  He would follow up with Dr. [name].  Subcutaneous heparin for DVT prophylaxis.  No  bleeding episodes.  Pain management with the use of Neurontin, a Lidoderm patch as well as oxycodone and Ultram as needed.  Acute blood loss anemia 8.7 and monitored.  Blood pressures remain controlled on Norvasc.  He would follow up with his primary MD.   Diabetes mellitus with history of DKA.  Hemoglobin A1c of 15.3.  Insulin therapy as directed with full diabetic teaching.  He had no chest pain or shortness of breath.  He remained on aspirin as prior to admission.  BPH, initially some difficulty in  voiding.  Maintained on Flomax as well as the addition of Urecholine.  The patient received weekly collaborative interdisciplinary team conferences to discuss estimated length of stay, family teaching, any barriers to discharge.  He was modified  independent squat pivot transfers, propelled is wheelchair supervision.  Can set at the edge of the bed independently.  Transferred from mat to a step to the floor with  minimal assistance.  Gathered belongings for activities of daily living in home  making.  Completed lower body dressing as needed.  Full family teaching was completed and planned discharge to  home.  DISCHARGE MEDICATIONS:  Included Norvasc 10 mg p.o. daily, aspirin 81 mg p.o. daily,  Urecholine 50 mg p.o. t.i.d. with taper, Alphagan ophthalmic solution 1 drop right eye twice daily, Pepcid 20 mg p.o. b.i.d., Neurontin 600 mg p.o. b.i.d., Lantus  insulin 25 units a.m. and 12 units p.m., Xalatan ophthalmic solution 1 drop both eyes at bedtime, Lopressor 50 mg twice daily, Lidoderm patch daily, Protonix 40 mg daily, MiraLax daily, Pred Forte 1 drop left eye at bedtime, Crestor 10 mg p.o. daily, Flomax 0.8 mg p.o. daily,  Timoptic ophthalmic solution 1 drop right eye twice daily, oxycodone 5 mg every 6 hours as needed for pain.  His diet was a diabetic diet.  He would follow up with Dr. Delice Lesch at the outpatient rehab service office as directed; Dr. Tania Ade,  call for appointment; Dr. [name], medical management.  TN/NUANCE D:09/16/2017 T:09/16/2017 JOB:[id number]

## 2017-09-16 NOTE — Telephone Encounter (Signed)
Copied from [place]. Topic: Quick Communication - See Telephone Encounter >> Sep 16, 2017  4:49 PM Cleaster Corin, NT wrote: CRM for notification. See Telephone encounter for: 09/16/17.  Pt. Wife calling stating that insurance has denied pt. Rx. For insulin aspart (NOVOLOG FLEXPEN) 100 UNIT/ML FlexPen [[id number]] seeing if pt. Can get a rx. For something else or see why this was denied. Tj Kitchings can be reached at [phone]

## 2017-09-16 NOTE — Progress Notes (Signed)
Physical Therapy Session Note  Patient Details  Name: Chase Clark MRN: [id number] Date of Birth: 1965/10/06  Today's Date: 09/16/2017 PT Individual Time: 0900-1000 PT Individual Time Calculation (min): 60 min   Short Term Goals: Week 1:  PT Short Term Goal 1 (Week 1): STG=LTG due to ELOS   Skilled Therapeutic Interventions/Progress Updates: Pt presented in bed agreeable to therapy. Pt stating some increased soreness from yesterday's activities. Pt performed squat pivot to w/c and propelled to ortho gym mod I. Performed car transfer mod I via squat pivot, ambulated with RW 30f supervision and transported remaining distance to ADL apt. Pt performed activities in ADL apt all as supervision or mod I level (see functional tab). Pt transported to rehab gym, transferred to mat squat pivot and performed sidelying shoulder abd and ER with 4lb wt, and sidelying hip abd 2 x 10 bilaterally. Pt returned to mat in same manner as prior and performed w/c propulsion throughout unit >4061fwith x 2 brief rest breaks due to fatigue. Pt returned to room and transferred to bed via squat pivot. Pt left in bed side lying with bed alarm on and needs met.      Therapy Documentation Precautions:  Precautions Precautions: Fall Precaution Comments: fx coccyx Restrictions Weight Bearing Restrictions: Yes LLE Weight Bearing: Non weight bearing General:   Vital Signs:  Pain:   Mobility: Bed Mobility Bed Mobility: Rolling Right;Rolling Left;Supine to Sit;Sit to Supine Rolling Right: Independent Rolling Left: Independent Supine to Sit: Independent Sit to Supine: Independent Transfers Sit to Stand: Independent with assistive device Stand Pivot Transfers: Independent with assistive device Squat Pivot Transfers: Independent with assistive device Locomotion : Gait Gait Assistance: Independent with assistive device Ambulation Distance (Feet): 50 Feet Gait Gait: Yes Gait Pattern: Impaired Stairs /  Additional Locomotion Stairs: Yes  Trunk/Postural Assessment : Cervical Assessment Cervical Assessment: Within Functional Limits Thoracic Assessment Thoracic Assessment: Within Functional Limits Lumbar Assessment Lumbar Assessment: Within Functional Limits Postural Control Postural Control: Within Functional Limits  Balance: Balance Balance Assessed: Yes Static Sitting Balance Static Sitting - Level of Assistance: 6: Modified independent (Device/Increase time) Dynamic Sitting Balance Dynamic Sitting - Level of Assistance: 5: Stand by assistance Static Standing Balance Static Standing - Balance Support: No upper extremity supported;During functional activity Static Standing - Level of Assistance: 6: Modified independent (Device/Increase time) Dynamic Standing Balance Dynamic Standing - Balance Support: Bilateral upper extremity supported Dynamic Standing - Level of Assistance: 5: Stand by assistance Exercises:   Other Treatments:     See Function Navigator for Current Functional Status.   Therapy/Group: Individual Therapy  Rosita DeChalus  Rosita DeChalus, PTA  09/16/2017, 12:08 PM

## 2017-09-16 NOTE — Telephone Encounter (Signed)
Spoke to Reform from [facility], he states this is a disposable device that adminsters insulin. Patients wife requested this and contacted him about this.

## 2017-09-16 NOTE — Telephone Encounter (Signed)
Given to Dr.Sonnenberg 

## 2017-09-16 NOTE — Telephone Encounter (Signed)
Left message for rep to return call, ok for pec to ask if this is for an insulin pump, if so then patient will need to see endocrinology

## 2017-09-16 NOTE — Discharge Summary (Signed)
Discharge summary job [phone]

## 2017-09-16 NOTE — Progress Notes (Signed)
Occupational Therapy Session Note  Patient Details  Name: Chase Clark MRN: [id number] Date of Birth: 12-Nov-1965  Today's Date: 09/16/2017 OT Individual Time: 1115-1155 OT Individual Time Calculation (min): 40 min    Short Term Goals: Week 1:  OT Short Term Goal 1 (Week 1): STG = LTGs due to ELOS  Skilled Therapeutic Interventions/Progress Updates:    OT intervention with focus on functional transfers, walk-in shower transfers, bed transfers, kitchen tasks (simple meal prep at w/c level), activity tolerance, and BUE therex to increase independence with BADLs. Pt propelled w/c to ADL apartment and practiced stepping into simulated shower stall with RW at supervision level.  Pt verbalized anxiety with picking up RW to step into tub but completed task without assistance and no LOB.  Pt practiced simple kitchen tasks at w/c level.  Educated pt on basic kitchen safety and placement of frequently used items in kitchen.  Pt verbalized understanding.  Pt demonstrated BUE therex from earlier sessions with OT.  Pt returned to room and bed.  Pt remained in bed with all needs within reach.   Therapy Documentation Precautions:  Precautions Precautions: Fall Precaution Comments: fx coccyx Restrictions Weight Bearing Restrictions: Yes LLE Weight Bearing: Non weight bearing    Vital Signs: Therapy Vitals Temp: 98.5 F (36.9 C) Temp Source: Oral Pulse Rate: 99 Resp: 16 BP: 135/77 Patient Position (if appropriate): Lying Oxygen Therapy SpO2: 100 % O2 Device: Room Air Pain:   Pt c/o coccyx discomfort; repositioned, RN aware  See Function Navigator for Current Functional Status.   Therapy/Group: Individual Therapy  Leroy Libman 09/16/2017, 2:47 PM

## 2017-09-16 NOTE — Telephone Encounter (Signed)
Pts wife advised on message below.

## 2017-09-16 NOTE — Telephone Encounter (Signed)
Please reach out to the patients wife and advise her that I do not manage insulin pumps which that device is and that he will need to see endocrinology for evaluation for this. Thanks.

## 2017-09-17 LAB — GLUCOSE, CAPILLARY: Glucose-Capillary: 191 mg/dL — ABNORMAL HIGH (ref 65–99)

## 2017-09-17 LAB — URINE CULTURE: Culture: NO GROWTH

## 2017-09-17 MED ORDER — GABAPENTIN 300 MG PO CAPS: 600.0000 mg | ORAL_CAPSULE | Freq: Two times a day (BID) | ORAL | 3 refills | Status: DC

## 2017-09-17 MED ORDER — AMLODIPINE BESYLATE 10 MG PO TABS: 10.0000 mg | ORAL_TABLET | Freq: Every day | ORAL | 0 refills | Status: DC

## 2017-09-17 MED ORDER — ROSUVASTATIN CALCIUM 10 MG PO TABS: 10.0000 mg | ORAL_TABLET | Freq: Every day | ORAL | 3 refills | Status: DC

## 2017-09-17 MED ORDER — ESOMEPRAZOLE MAGNESIUM 40 MG PO CPDR: 40.0000 mg | DELAYED_RELEASE_CAPSULE | Freq: Every day | ORAL | 0 refills | Status: DC

## 2017-09-17 MED ORDER — BETHANECHOL CHLORIDE 50 MG PO TABS: ORAL_TABLET | ORAL | 0 refills | Status: DC

## 2017-09-17 MED ORDER — METOPROLOL TARTRATE 100 MG PO TABS: 50.0000 mg | ORAL_TABLET | Freq: Two times a day (BID) | ORAL | 0 refills | Status: DC

## 2017-09-17 MED ORDER — TAMSULOSIN HCL 0.4 MG PO CAPS: 0.8000 mg | ORAL_CAPSULE | Freq: Every day | ORAL | 1 refills | Status: DC

## 2017-09-17 MED ORDER — LIDOCAINE 5 % EX PTCH: 1.0000 | MEDICATED_PATCH | CUTANEOUS | 0 refills | Status: DC

## 2017-09-17 MED ORDER — INSULIN GLARGINE 100 UNITS/ML SOLOSTAR PEN: 25.0000 [IU] | PEN_INJECTOR | Freq: Every day | SUBCUTANEOUS | 11 refills | Status: DC

## 2017-09-17 MED ORDER — INSULIN GLARGINE 100 UNITS/ML SOLOSTAR PEN: 12.0000 [IU] | PEN_INJECTOR | Freq: Every day | SUBCUTANEOUS | 11 refills | Status: DC

## 2017-09-17 MED ORDER — OXYCODONE HCL 5 MG PO TABS: 5.0000 mg | ORAL_TABLET | Freq: Four times a day (QID) | ORAL | 0 refills | Status: DC | PRN

## 2017-09-17 NOTE — Telephone Encounter (Signed)
Tried calling [facility] but they did not answer.Patients wife states she is not sure what the dose is currently, she states she was told Humalog may be recommended. Patients wife would like a referral to [facility]. She would like this to be schedule after 4 pm. Please advise

## 2017-09-17 NOTE — Progress Notes (Signed)
[facility] PHYSICAL MEDICINE & REHABILITATION     PROGRESS NOTE  Subjective/Complaints:  Patient seen lying in bed this morning. He states he slept well overnight. He is very positive and appreciative and ready for discharge today.  ROS: denies CP, SOB, nausea, vomiting, diarrhea.  Objective: Vital Signs: Blood pressure (!) 159/73, pulse 86, temperature 98.5 F (36.9 C), temperature source Oral, resp. rate 18, height 5\' 10"  (1.778 m), weight 84.9 kg (187 lb 2.7 oz), SpO2 97 %. No results found. Recent Labs    09/15/17 0532  WBC 11.5*  HGB 8.7*  HCT 27.5*  PLT 387   Recent Labs    09/15/17 0532  NA 136  K 4.1  CL 98*  GLUCOSE 255*  BUN 13  CREATININE 1.79*  CALCIUM 8.8*   CBG (last 3)  Recent Labs    09/16/17 1639 09/16/17 2126 09/17/17 0628  GLUCAP 170* 372* 191*    Wt Readings from Last 3 Encounters:  09/09/17 84.9 kg (187 lb 2.7 oz)  09/04/17 84.8 kg (187 lb)  08/23/17 84.9 kg (187 lb 2.7 oz)    Physical Exam:  BP (!) 159/73 (BP Location: Left Arm)   Pulse 86   Temp 98.5 F (36.9 C) (Oral)   Resp 18   Ht 5\' 10"  (1.778 m)   Wt 84.9 kg (187 lb 2.7 oz)   SpO2 97%   BMI 26.86 kg/m  Constitutional: He appearswell-developedand well-nourished. NAD. HENT: Normocephalicand atraumatic.  Eyes:EOMare normal. No discharge.  Cardiovascular:RRR. No JVD. Respiratory:Effort normal and breath sounds normal.   IN:OMVEH sounds are normal. He exhibitsno distension.  Musculoskeletal: Left stump with tenderness and mild edema, improving Neurological: He isalert.  Alert and oriented. Motor: B/l UE 5/5 proximal to distal RLE: 4+/5 proximal to distal LLE: HF, KE 5/5  Skin: Skin iswarmand dry. BKA site with sutures C/D/I Psychiatric: He has anormal mood and affect. Hisbehavior is normal.  Assessment/Plan: 1. Functional deficits secondary to Left BKA which require 3+ hours per day of interdisciplinary therapy in a comprehensive inpatient rehab  setting. Physiatrist is providing close team supervision and 24 hour management of active medical problems listed below. Physiatrist and rehab team continue to assess barriers to discharge/monitor patient progress toward functional and medical goals.  Function:  Bathing Bathing position   Position: Shower  Bathing parts Body parts bathed by patient: Right arm, Left arm, Chest, Abdomen, Front perineal area, Buttocks, Right upper leg, Left upper leg, Right lower leg, Back Body parts bathed by helper: Back  Bathing assist Assist Level: Set up   Set up : To obtain items  Upper Body Dressing/Undressing Upper body dressing   What is the patient wearing?: Pull over shirt/dress     Pull over shirt/dress - Perfomed by patient: Thread/unthread right sleeve, Thread/unthread left sleeve, Put head through opening, Pull shirt over trunk          Upper body assist Assist Level: More than reasonable time      Lower Body Dressing/Undressing Lower body dressing   What is the patient wearing?: Underwear, Pants, Non-skid slipper socks, Socks, Shoes Underwear - Performed by patient: Thread/unthread left underwear leg, Pull underwear up/down, Thread/unthread right underwear leg Underwear - Performed by helper: Thread/unthread right underwear leg Pants- Performed by patient: Thread/unthread left pants leg, Pull pants up/down, Thread/unthread right pants leg Pants- Performed by helper: Thread/unthread right pants leg, Thread/unthread left pants leg Non-skid slipper socks- Performed by patient: Don/doff right sock Non-skid slipper socks- Performed by helper: Don/doff right sock  Socks - Performed by patient: Don/doff right sock   Shoes - Performed by patient: Don/doff right shoe, Fasten right            Lower body assist Assist for lower body dressing: Set up      Toileting Toileting   Toileting steps completed by patient: Adjust clothing prior to toileting, Adjust clothing after toileting,  Performs perineal hygiene Toileting steps completed by helper: Performs perineal hygiene Toileting Assistive Devices: Grab bar or rail  Toileting assist Assist level: More than reasonable time   Transfers Chair/bed transfer   Chair/bed transfer method: Squat pivot Chair/bed transfer assist level: No Help, no cues, assistive device, takes more than a reasonable amount of time Chair/bed transfer assistive device: Armrests     Locomotion Ambulation     Max distance: 50 Assist level: No help, No cues, assistive device, takes more than a reasonable amount of time   Wheelchair   Type: Manual Max wheelchair distance: 234ft Assist Level: No help, No cues, assistive device, takes more than reasonable amount of time  Cognition Comprehension Comprehension assist level: Follows complex conversation/direction with no assist  Expression Expression assist level: Expresses complex 90% of the time/cues < 10% of the time  Social Interaction Social Interaction assist level: Interacts appropriately with others with medication or extra time (anti-anxiety, antidepressant).  Problem Solving Problem solving assist level: Solves basic problems with no assist  Memory Memory assist level: Complete Independence: No helper    Medical Problem List and Plan: 1.  Decreased functional mobility secondary to sepsis secondary to left diabetic foot ulcer status post left BKA 09/05/2017  D/c today  Will see patient for transitional care management in 1-2 weeks   Stump shrinker ordered 2.  DVT Prophylaxis/Anticoagulation: Subcutaneous heparin.  Monitor for any bleeding episodes 3. Pain Management: Neurontin 600 mg twice daily, Lidoderm patch, Ultram and oxycodone as needed 4. Mood: Provide emotional support 5. Neuropsych: This patient is capable of making decisions on his own behalf. 6. Skin/Wound Care: Routine skin checks 7. Fluids/Electrolytes/Nutrition: Routine in and outs  8.  Acute blood loss anemia.      Hemoglobin 8.7 on 6/4 9.  ID/sepsis.    Cultures no growth.    Vancomycin and Zosyn changed to doxycycline completed on 6/2 10.  CKD stage III.     Creatinine 1.79 on 6/4 11.  Diabetes mellitus with peripheral neuropathy as well as history of DKA.  Hemoglobin A1c 15.3.    Lantus insulin 50 units daily decreased to 25 on 6/2  Added 12 units daily at bedtime on 6/4    Check blood sugars before meals and at bedtime.  Diabetic teaching   Extremely labile and 6/6  Will need ambulatory monitoring 12.  CAD with PCI stenting 2007.  Continue aspirin.  No chest pain or shortness of breath 13.  Hypertension.  Norvasc 10 mg daily   Labile,? Trending up on 6/6  Will need ambulatory monitoring 14.  Hyperlipidemia.  Crestor 15.  BPH.  Flomax 0.4 mg daily, increased on 5/30.     PVRs suggesting urinary retention   Urecholine started on 6/2, increased to 25 mg on 6/3, increased to 50 on 6/4  improved 16. Leukocytosis  WBCs 11.5 on 6/4  Afebrile  Last urine culture with multiple species, recollection remains pending on 6/6  LOS (Days) 8 A FACE TO FACE EVALUATION WAS PERFORMED  Ankit Lorie Phenix 09/17/2017 8:48 AM

## 2017-09-17 NOTE — Telephone Encounter (Signed)
I do not see a novolog prescription in his chart from discharge from rehab. It appears he is only on Lantus. Please confirm this with his wife. I have placed a referral to endocrinology.

## 2017-09-17 NOTE — Progress Notes (Signed)
Social Work  Discharge Note  The overall goal for the admission was met for:   Discharge location: Yes - home with wife who can provide intermittent assistance  Length of Stay: Yes - 8 days  Discharge activity level: Yes - modified independent  Home/community participation: Yes  Services provided included: MD, RD, PT, OT, RN, TR, Pharmacy and SW  Financial Services: Private Insurance: [employer] [place]  Follow-up services arranged: DME: 18x18 lightweight w/c with left amputee support pad, cushion, rolling walker and 3n1 commode via [facility]. and Patient/Family has no preference for HH/DME agencies  Comments (or additional information): Therapy team does not recommend therapy at time of d/c, however, recommend when pt begins prosthesis fitting.  Patient/Family verbalized understanding of follow-up arrangements: Yes  Individual responsible for coordination of the follow-up plan: pt  Confirmed correct DME delivered: [name], [name] 09/17/2017    [name], [name]

## 2017-09-17 NOTE — Telephone Encounter (Signed)
Please contact the pharmacy and see if there is a reason that this was denied. Please also find out what his current dosing for his novolog is as he appears to be on a sliding scale at rehab. It also looks like he is being discharged today. If needed I can send in humalog to see if that is covered.  Thanks.

## 2017-09-17 NOTE — Telephone Encounter (Signed)
Please advise 

## 2017-09-17 NOTE — Progress Notes (Signed)
Physical Therapy Session Note  Patient Details  Name: Chase Clark MRN: [id number] Date of Birth: Jun 28, 1965  Today's Date: 09/17/2017 PT Individual Time:1500-1545   45 min   Short Term Goals: Week 1:  PT Short Term Goal 1 (Week 1): STG=LTG due to ELOS   Skilled Therapeutic Interventions/Progress Updates:   Pt received supine in bed and agreeable to PT. Supine>sit transfer without assist or cues     Mod I squat pivot transfer to [facility] mobility training to day room x 213f without assist from PT. Gait training x 545fin simulate home environment through day room x 5034fithout cues or assist.   PT instructed pt in stair management with pt self selecting to perform bump-up technique without cues or assist from PT x 4 steps.  Pt also instructed in floor transfer to chair to simulate access to home once completing bump up stair. Pt noted to utilize knee of LLE without pressure on residual limb to transfer form floor without cues of assist from PT.   Pt returned to room and performed squat pivot transfer to bed without assist. Sit>supine completed without assist, and pt left supine in bed with call bell in reach and all needs met.    Therapy Documentation Precautions:  Precautions Precautions: Fall Precaution Comments: fx coccyx Restrictions Weight Bearing Restrictions: Yes LLE Weight Bearing: Non weight bearing    Vital Signs: Therapy Vitals Temp: 98.5 F (36.9 C) Temp Source: Oral Pulse Rate: 86 Resp: 18 BP: (!) 159/73 Patient Position (if appropriate): Lying Oxygen Therapy SpO2: 97 % O2 Device: Room Air Pain: Denies at rest.   See Function Navigator for Current Functional Status.   Therapy/Group: Individual Therapy  AusLorie Phenix6/2019, 6:24 AM

## 2017-09-17 NOTE — Patient Care Conference (Signed)
r Inpatient RehabilitationTeam Conference and Plan of Care Update Date: 09/16/2017   Time: 2:35 PM    Patient Name: Chase Clark      Medical Record Number: [id number]  Date of Birth: [birth year] Sex: Male         Room/Bed: 4M11C/4M11C-01 Payor Info: Payor: BLUE CROSS BLUE SHIELD / Plan: BCBS OTHER / Product Type: *No Product type* /    Admitting Diagnosis: L BKA  Admit Date/Time:  09/09/2017  4:36 PM Admission Comments: No comment available   Primary Diagnosis:  <principal problem not specified> Principal Problem: <principal problem not specified>  Patient Active Problem List   Diagnosis Date Noted  . Labile blood pressure   . Labile blood glucose   . Hypoglycemia   . Poorly controlled type 2 diabetes mellitus with peripheral neuropathy ([place])   . Diabetic osteomyelitis ([place])   . Amputation of left lower extremity below knee ([place]) 09/09/2017  . Benign prostatic hyperplasia   . Hyperlipidemia   . Stage 3 chronic kidney disease ([place])   . Neuropathic pain   . Osteomyelitis of left foot ([place])   . Coronary artery disease involving native coronary artery of native heart without angina pectoris   . Benign essential HTN   . Diabetes mellitus type 2 in nonobese ([facility])   . Post-operative pain   . Acute blood loss anemia   . Anemia of chronic disease   . Leukocytosis   . Sepsis ([place]) 09/04/2017  . Pressure injury of skin 08/24/2017  . DKA (diabetic ketoacidoses) ([place]) 08/23/2017  . Cervical spondylosis with myelopathy and radiculopathy 05/07/2017  . ARF (acute renal failure) ([place]) 04/30/2017  . Upper respiratory infection 04/29/2017  . Preop examination 04/29/2017  . Fatty liver 04/04/2017  . Routine general medical examination at a health care facility 01/13/2017  . DDD (degenerative disc disease), cervical 01/13/2017  . PAD (peripheral artery disease) ([place]) 08/07/2016  . Ulcer of ankle ([place]) 08/07/2016  . Chronic diarrhea 05/17/2016  . Hyponatremia 05/17/2016  . Anemia  01/12/2014  . Disorder of ligament of ankle 07/31/2013  . Edema 06/28/2013  . 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 Obesity (BMI 30-39.9) 06/12/2013  . Pain in joint, shoulder region 06/12/2013  . Statin intolerance 06/10/2013  . Diabetic ulcer of foot with muscle involvement without evidence of necrosis ([place])   . DM (diabetes mellitus), type 2, uncontrolled w/ophthalmic complication ([place]) 78/29/5621  . Hyperlipidemia associated with type 2 diabetes mellitus ([place]) 07/17/2006  . GILBERT'S SYNDROME 07/17/2006  . Essential hypertension 07/17/2006  . Coronary atherosclerosis 07/17/2006  . OSTEOARTHRITIS 07/17/2006  . COUGH 07/17/2006    Expected Discharge Date: Expected Discharge Date: 09/17/17  Team Members Present: Physician leading conference: Dr. Delice Lesch Social Worker Present: Lennart Pall, LCSW Nurse Present: Frances Maywood, RN PT Present: Barrie Folk, PT OT Present: Simonne Come, OT PPS Coordinator present : Daiva Nakayama, RN, CRRN     Current Status/Progress Goal Weekly Team Focus  Medical   Decreased functional mobility secondary to sepsis secondary to left diabetic foot ulcer status post left BKA 09/05/2017  Improve mobility, safety, CBGs, BPH  See above   Bowel/Bladder   cont b/b; lbm 6/4; i&o cath per orders  maintain  assess q shift and prn   Swallow/Nutrition/ Hydration             ADL's   Min assist shower transfer with RW, supervision toilet transfers, supervision/setup bathing and dressing  Mod I toilet transfers, supervision bathing/dressing and shower transfers  toilet transfers from w/c and walk-in shower transfer with RW, activity tolerance   Mobility   mod I bed mobility, mod I transfers, mod I w/c mobility, supervision gait up to 50 ft  mod I w/c level  d/c planning, HEP   Communication             Safety/Cognition/ Behavioral Observations            Pain   c/o pain to coccyx area  <3 out of 10  assess q shift and prn; oxy and tramadol    Skin   L BKA site with stump shrinker  free from infection/breakdown with min asssist  assess q shift and prn    Rehab Goals Patient on target to meet rehab goals: Yes *See Care Plan and progress notes for long and short-term goals.     Barriers to Discharge  Current Status/Progress Possible Resolutions Date Resolved   Physician    Medical stability;Weight bearing restrictions     See above  Therapies, optimize DM/HTN meds, follow labs, optimize bladder meds      Nursing                  PT                    OT                  SLP                SW                Discharge Planning/Teaching Needs:  Home with wife who can provide intermittent support.  NA - mod ind goals.   Team Discussion:  BPH improving; labs good.  Pain controlled.  Mostly mod ind w/c level except supervision with showers.  Mod ind with stairs.  Ready for d/c tomorrow.  No need for therapy until ready for prosthesis/ training.  Revisions to Treatment Plan:  NA    Continued Need for Acute Rehabilitation Level of Care: The patient requires daily medical management by a physician with specialized training in physical medicine and rehabilitation for the following conditions: Daily direction of a multidisciplinary physical rehabilitation program to ensure safe treatment while eliciting the highest outcome that is of practical value to the patient.: Yes Daily medical management of patient stability for increased activity during participation in an intensive rehabilitation regime.: Yes Daily analysis of laboratory values and/or radiology reports with any subsequent need for medication adjustment of medical intervention for : Diabetes problems;Blood pressure problems;Other  [name], [name] 09/17/2017, 7:55 AM

## 2017-09-18 NOTE — Telephone Encounter (Signed)
Left message to return call, ok for pec to speak to patients wife about message below

## 2017-09-21 ENCOUNTER — Telehealth: Payer: Self-pay | Admitting: Registered Nurse

## 2017-09-21 NOTE — Telephone Encounter (Signed)
Placed a call to Mr. Jividen on home number and cell, left message. Awaiting a return call.

## 2017-09-22 NOTE — Telephone Encounter (Signed)
Called pt for TC call and had to leave a message

## 2017-09-23 ENCOUNTER — Telehealth: Payer: Self-pay

## 2017-09-23 NOTE — Telephone Encounter (Signed)
Copied from [place] [phone]. Topic: General - Other >> Sep 23, 2017  3:30 PM Boyd Kerbs wrote: Reason for CRM:  [facility] health adviser [phone] needing information  latest A1C, LDL, Micro Urine albumin, BP, Medication he is taking BP and Diabetes, plan of care, and  Is he compliance? Is he getting eye exams?   Can fax # [phone]  or  Please call [facility]

## 2017-09-23 NOTE — Telephone Encounter (Signed)
Please advise 

## 2017-09-24 NOTE — Telephone Encounter (Signed)
Please find his most recent A1c and LDL and print those labs.  His last urine microalbumin was done in 2016 as he was on an ACE inhibitor and an ARB until recently.  You can print off his medication list as well so that that can be sent in for documentation of his blood pressure and diabetes.  I believe he has been seeing ophthalmology as he had cataracts removed in 2018.  His compliance is waxed and waned over the last year with his diabetic regimen.  We have referred him to endocrinology to help manage his diabetes.

## 2017-09-25 ENCOUNTER — Encounter: Payer: Self-pay | Admitting: Physical Medicine & Rehabilitation

## 2017-09-25 ENCOUNTER — Encounter
Payer: BLUE CROSS/BLUE SHIELD | Attending: Physical Medicine & Rehabilitation | Admitting: Physical Medicine & Rehabilitation

## 2017-09-25 ENCOUNTER — Telehealth: Payer: Self-pay

## 2017-09-25 ENCOUNTER — Other Ambulatory Visit: Payer: Self-pay

## 2017-09-25 VITALS — BP 143/90 | HR 69 | Ht 70.0 in | Wt 182.0 lb

## 2017-09-25 DIAGNOSIS — E11621 Type 2 diabetes mellitus with foot ulcer: Secondary | ICD-10-CM | POA: Diagnosis not present

## 2017-09-25 DIAGNOSIS — S88112A Complete traumatic amputation at level between knee and ankle, left lower leg, initial encounter: Secondary | ICD-10-CM

## 2017-09-25 DIAGNOSIS — G546 Phantom limb syndrome with pain: Secondary | ICD-10-CM | POA: Diagnosis not present

## 2017-09-25 DIAGNOSIS — E1140 Type 2 diabetes mellitus with diabetic neuropathy, unspecified: Secondary | ICD-10-CM | POA: Insufficient documentation

## 2017-09-25 DIAGNOSIS — I2510 Atherosclerotic heart disease of native coronary artery without angina pectoris: Secondary | ICD-10-CM | POA: Diagnosis not present

## 2017-09-25 DIAGNOSIS — E1139 Type 2 diabetes mellitus with other diabetic ophthalmic complication: Secondary | ICD-10-CM | POA: Diagnosis not present

## 2017-09-25 DIAGNOSIS — E1142 Type 2 diabetes mellitus with diabetic polyneuropathy: Secondary | ICD-10-CM | POA: Diagnosis not present

## 2017-09-25 DIAGNOSIS — R338 Other retention of urine: Secondary | ICD-10-CM

## 2017-09-25 DIAGNOSIS — E1122 Type 2 diabetes mellitus with diabetic chronic kidney disease: Secondary | ICD-10-CM | POA: Insufficient documentation

## 2017-09-25 DIAGNOSIS — R269 Unspecified abnormalities of gait and mobility: Secondary | ICD-10-CM | POA: Insufficient documentation

## 2017-09-25 DIAGNOSIS — I10 Essential (primary) hypertension: Secondary | ICD-10-CM | POA: Insufficient documentation

## 2017-09-25 DIAGNOSIS — M7989 Other specified soft tissue disorders: Secondary | ICD-10-CM | POA: Diagnosis not present

## 2017-09-25 DIAGNOSIS — E119 Type 2 diabetes mellitus without complications: Secondary | ICD-10-CM

## 2017-09-25 DIAGNOSIS — N401 Enlarged prostate with lower urinary tract symptoms: Secondary | ICD-10-CM | POA: Diagnosis not present

## 2017-09-25 DIAGNOSIS — N183 Chronic kidney disease, stage 3 unspecified: Secondary | ICD-10-CM

## 2017-09-25 DIAGNOSIS — Z89512 Acquired absence of left leg below knee: Secondary | ICD-10-CM | POA: Insufficient documentation

## 2017-09-25 DIAGNOSIS — E1165 Type 2 diabetes mellitus with hyperglycemia: Secondary | ICD-10-CM

## 2017-09-25 DIAGNOSIS — IMO0002 Reserved for concepts with insufficient information to code with codable children: Secondary | ICD-10-CM

## 2017-09-25 NOTE — Telephone Encounter (Signed)
Dr. Posey Pronto cancelled UDS today.

## 2017-09-25 NOTE — Telephone Encounter (Signed)
Faxed documents to fax number provided

## 2017-09-25 NOTE — Progress Notes (Signed)
Subjective:    Patient ID: Chase Clark, male    DOB: 1965-09-14, 52 y.o.   MRN: [id number]  HPI 52 year old right-handed male with a history of CAD with stenting, diabetes mellitus, CKD stage III presents for transitional care management after receiving CIR for left BKA.  DATE OF DISCHARGE:  09/17/2017 DATE OF ADMISSION:  09/09/2017  At discharge, he was instructed to follow up with Ortho.  He is not aware if he has an appointment.  He says his wife knows.  He sees PCP next month.  He has an appointment with Endo in 2 months with plans for insulin pump. Pain is relatively controlled.  Thins are significantly better.  BP is relatively controlled. Denies falls. He is voiding without difficulty.  Therapies: Awaiting prothesis, continuing HEP DME: Toilet seat Mobility: Wheelchair all times  Pain Inventory Average Pain 4 Pain Right Now 4 My pain is aching  In the last 24 hours, has pain interfered with the following? General activity 9 Relation with others 10 Enjoyment of life 10 What TIME of day is your pain at its worst? all Sleep (in general) Fair  Pain is worse with: sitting Pain improves with: rest and heat/ice Relief from Meds: n/a  Mobility ability to climb steps?  no do you drive?  yes use a wheelchair  Function employed # of hrs/week n/a I need assistance with the following:  household duties and shopping  Neuro/Psych No problems in this area  Prior Studies Any changes since last visit?  no  Physicians involved in your care Any changes since last visit?  no   Family History  Problem Relation Age of Onset  . Hyperlipidemia Mother   . Diabetes Mother   . Liver disease Mother        NASH  . Hyperlipidemia Father   . Diabetes Father   . Heart disease Father   . Hypertension Father    Social History   Socioeconomic History  . Marital status: Married    Spouse name: Not on file  . Number of children: Not on file  . Years of education: Not on  file  . Highest education level: Not on file  Occupational History  . Not on file  Social Needs  . Financial resource strain: Not on file  . Food insecurity:    Worry: Not on file    Inability: Not on file  . Transportation needs:    Medical: Not on file    Non-medical: Not on file  Tobacco Use  . Smoking status: Former Smoker    Types: Cigarettes    Last attempt to quit: 03/14/2008    Years since quitting: 9.5  . Smokeless tobacco: Never Used  Substance and Sexual Activity  . Alcohol use: Yes    Comment: OCCAS  . Drug use: No  . Sexual activity: Not on file  Lifestyle  . Physical activity:    Days per week: Not on file    Minutes per session: Not on file  . Stress: Not on file  Relationships  . Social connections:    Talks on phone: Not on file    Gets together: Not on file    Attends religious service: Not on file    Active member of club or organization: Not on file    Attends meetings of clubs or organizations: Not on file    Relationship status: Not on file  Other Topics Concern  . Not on file  Social History Narrative  Used to work as paramedic    Past Surgical History:  Procedure Laterality Date  . ABDOMINAL AORTOGRAM N/A 05/20/2016   Procedure: Abdominal Aortogram possible intervention;  Surgeon: Katha Cabal, MD;  Location: [facility];  Service: Cardiovascular;  Laterality: N/A;  . AMPUTATION Left 09/05/2017   Procedure: AMPUTATION BELOW KNEE;  Surgeon: Tania Ade, MD;  Location: [place];  Service: Orthopedics;  Laterality: Left;  . ANTERIOR CERVICAL DECOMP/DISCECTOMY FUSION N/A 05/07/2017   Procedure: ANTERIOR CERVICAL DECOMPRESSION/DISCECTOMY Luevenia Maxin PROSTHESIS,PLATE/SCREWS CERVICAL FIVE - CERVICAL SIX;  Surgeon: Newman Pies, MD;  Location: [place];  Service: Neurosurgery;  Laterality: N/A;  . CARDIAC CATHETERIZATION    . CATARACT EXTRACTION W/ INTRAOCULAR LENS IMPLANT    . CATARACT EXTRACTION W/PHACO Left 02/26/2017    Procedure: CATARACT EXTRACTION PHACO AND INTRAOCULAR LENS PLACEMENT (IOC);  Surgeon: Eulogio Bear, MD;  Location: [facility];  Service: Ophthalmology;  Laterality: Left;  Lot # [id number] H [place]: 00:25.3 AP%: 6.2 CDE: 1.59   . CHOLECYSTECTOMY N/A   . CORONARY ANGIOPLASTY WITH STENT PLACEMENT  2007   1st diagonal  . EPIBLEPHERON REPAIR WITH TEAR DUCT PROBING    . EYE SURGERY Right sept 29n2015   cataract extraction  . INSERTION EXPRESS TUBE SHUNT Right 09/06/2015   Procedure: INSERTION AHMED TUBE SHUNT with tutoplast allograft;  Surgeon: Eulogio Bear, MD;  Location: [facility];  Service: Ophthalmology;  Laterality: Right;  Marland Kitchen VASECTOMY     Past Medical History:  Diagnosis Date  . Arthritis   . Asthma    as child  . Coronary artery disease    DES DIAG1 11/20/09 [facility])  . Diabetes mellitus without complication ([place])   . Diverticulosis   . Edema, lower extremity   . Fatty liver   . GERD (gastroesophageal reflux disease)   . Gilbert's disease   . Glaucoma   . Heart disease 2011   Patient has stent for 80% blockage  . Hyperlipidemia   . Hypertension   . Neuropathy   . Seasonal allergies   . Shoulder pain   . Ulcer of foot ([place]) 08.06.2014   DIABETIC ULCERATIONS ASSOCIATED WITH IRRITATION LATERAL ANKLE LEFT GREATER THAN RIGHT WITH MILD CELLULITIS   BP (!) 143/90   Pulse 69   Ht 5\' 10"  (1.778 m) Comment: pt reported, in wheelchair  Wt 182 lb (82.6 kg) Comment: pt reported, in wheelchair  SpO2 97%   BMI 26.11 kg/m   Opioid Risk Score:   Fall Risk Score:  `1  Depression screen PHQ 2/9  Depression screen [facility] 2/9 09/25/2017 08/29/2016  Decreased Interest 0 1  Down, Depressed, Hopeless 0 0  PHQ - 2 Score 0 1  Altered sleeping - 1  Tired, decreased energy - 2  Change in appetite - 0  Feeling bad or failure about yourself  - 0  Trouble concentrating - 0  Moving slowly or fidgety/restless - 0  Suicidal thoughts - 0  PHQ-9 Score - 4    Review of Systems  Constitutional:  Positive for unexpected weight change.  HENT: Negative.   Eyes: Negative.   Respiratory: Negative.   Gastrointestinal: Positive for nausea.  Endocrine: Negative.   Genitourinary: Positive for difficulty urinating.  Musculoskeletal: Negative.   Skin: Negative.   Allergic/Immunologic: Negative.   Neurological: Negative.   Hematological: Negative.   Psychiatric/Behavioral: Negative.   All other systems reviewed and are negative.     Objective:   Physical Exam Constitutional: He appears well-developed and well-nourished. NAD. HENT: Normocephalic  and atraumatic.  Eyes: EOM are normal. No discharge.  Cardiovascular: RRR. No JVD. Respiratory: Effort normal and breath sounds normal.   GI: Bowel sounds are normal. He exhibits no distension.  Musculoskeletal:  Left stump with tenderness and mild edema, improving Neurological: He is alert.    Alert and oriented. Motor: B/l UE 4+/5 proximal to distal RLE: 5/5 proximal to distal LLE: HF, KE 5/5   Skin: Skin is warm and dry.  BKA site with sutures C/D/I Psychiatric: He has a normal mood and affect. His behavior is normal.     Assessment & Plan:  52 year old right-handed male with a history of CAD with stenting, diabetes mellitus, CKD stage III presents for transitional care management after receiving CIR for left BKA.  1. Decreased functional mobility secondary to sepsis secondary to left diabetic foot ulcer status post left BKA 09/05/2017   Therapies after prosthesis  Follow up with Surgery  Patient only has 1 shrinker, encouraged follow up to obtain  2. Pain Management/Phantom limb pain:   Cont meds  Educated on weaning Oxy and tramadol  Neurontin 600 mg twice daily   3.  CKD stage III.    Follow up with PCP  4.  Diabetes mellitus with peripheral neuropathy   CBGs between 200-300, encouraged follow up with PCP  Follow up with Endo with plans for insulin pump  5.  Hypertension.    Cont meds  6.  BPH.    Cont  meds  Controlled at present  7. Gait abnormality  Therapies when prosthesis  Cont wheelchair for safety  Meds reviewed Labs reviewed All questions answered

## 2017-09-28 DIAGNOSIS — Z9889 Other specified postprocedural states: Secondary | ICD-10-CM | POA: Diagnosis not present

## 2017-09-30 ENCOUNTER — Ambulatory Visit (INDEPENDENT_AMBULATORY_CARE_PROVIDER_SITE_OTHER): Payer: BLUE CROSS/BLUE SHIELD | Admitting: Family Medicine

## 2017-09-30 ENCOUNTER — Encounter: Payer: Self-pay | Admitting: Family Medicine

## 2017-09-30 VITALS — BP 130/84 | HR 64 | Temp 97.8°F | Ht 70.0 in | Wt 190.6 lb

## 2017-09-30 DIAGNOSIS — N179 Acute kidney failure, unspecified: Secondary | ICD-10-CM | POA: Diagnosis not present

## 2017-09-30 DIAGNOSIS — D649 Anemia, unspecified: Secondary | ICD-10-CM

## 2017-09-30 DIAGNOSIS — D638 Anemia in other chronic diseases classified elsewhere: Secondary | ICD-10-CM | POA: Diagnosis not present

## 2017-09-30 DIAGNOSIS — IMO0002 Reserved for concepts with insufficient information to code with codable children: Secondary | ICD-10-CM

## 2017-09-30 DIAGNOSIS — E1139 Type 2 diabetes mellitus with other diabetic ophthalmic complication: Secondary | ICD-10-CM

## 2017-09-30 DIAGNOSIS — E1165 Type 2 diabetes mellitus with hyperglycemia: Secondary | ICD-10-CM | POA: Diagnosis not present

## 2017-09-30 DIAGNOSIS — R600 Localized edema: Secondary | ICD-10-CM

## 2017-09-30 DIAGNOSIS — S88112A Complete traumatic amputation at level between knee and ankle, left lower leg, initial encounter: Secondary | ICD-10-CM

## 2017-09-30 DIAGNOSIS — Z89512 Acquired absence of left leg below knee: Secondary | ICD-10-CM

## 2017-09-30 LAB — BASIC METABOLIC PANEL
BUN: 22 mg/dL (ref 6–23)
CO2: 28 mEq/L (ref 19–32)
Calcium: 9.5 mg/dL (ref 8.4–10.5)
Chloride: 103 mEq/L (ref 96–112)
Creatinine, Ser: 1.65 mg/dL — ABNORMAL HIGH (ref 0.40–1.50)
GFR: 46.41 mL/min — ABNORMAL LOW (ref >60.00)
Glucose, Bld: 121 mg/dL — ABNORMAL HIGH (ref 70–99)
Potassium: 4.7 mEq/L (ref 3.5–5.1)
Sodium: 138 mEq/L (ref 135–145)

## 2017-09-30 LAB — CBC
HCT: 29.6 % — ABNORMAL LOW (ref 39.0–52.0)
Hemoglobin: 10.1 g/dL — ABNORMAL LOW (ref 13.0–17.0)
MCHC: 34 g/dL (ref 30.0–36.0)
MCV: 86.4 fl (ref 78.0–100.0)
Platelets: 261 10*3/uL (ref 150.0–400.0)
RBC: 3.43 Mil/uL — ABNORMAL LOW (ref 4.22–5.81)
RDW: 15.9 % — ABNORMAL HIGH (ref 11.5–15.5)
WBC: 8.6 10*3/uL (ref 4.0–10.5)

## 2017-09-30 MED ORDER — INSULIN LISPRO 100 UNIT/ML (KWIKPEN): 10.0000 [IU] | PEN_INJECTOR | Freq: Three times a day (TID) | SUBCUTANEOUS | 3 refills | Status: DC

## 2017-09-30 NOTE — Telephone Encounter (Signed)
Patient came into office today.

## 2017-09-30 NOTE — Assessment & Plan Note (Signed)
Sugars seem to be quite a bit better controlled.  I suspect his infection was playing a role previously.  He will continue with his current regimen.  He will see endocrinology as planned.

## 2017-09-30 NOTE — Assessment & Plan Note (Signed)
Possibly with venous insufficiency.  Could be anemia related.  Check renal function.  Check CBC.

## 2017-09-30 NOTE — Patient Instructions (Signed)
Nice to see you. Glad you are doing better. Please see endocrinology as planned.  We will fax in your Humalog. We will check lab work today and contact you with the results. If your stump or incision site starts to appear red or draining pus or swell more or becomes painful please be evaluated immediately.

## 2017-09-30 NOTE — Assessment & Plan Note (Signed)
Patient is status post BKA.  He has done quite well and feels much better than he did previously.  I suspect he had some level of underlying infection for some time it was making his sugar difficult to control and causing him to not feel well.  He will continue to see [facility] and orthopedics.

## 2017-09-30 NOTE — Assessment & Plan Note (Signed)
Patient with anemia of chronic disease also likely acute blood loss anemia following surgery.  We will recheck a CBC today.

## 2017-09-30 NOTE — Progress Notes (Signed)
[name], MD Phone: [phone]  Chase Clark is a 52 y.o. male who presents today for f/u.  CC: hospital follow-up osteomyelitis, diabetes, anemia  Patient was recently hospitalized for left lower extremity osteomyelitis leading to sepsis.  He was admitted to the hospital and started on vancomycin and Zosyn.  He underwent BKA by Dr. [name].  He was continued on oral doxycycline and was discharged to CIR.  He did quite well there.  He has had no pain in his stump.  He has followed up with orthopedics as well as [employer] for a prosthesis fitting.  He feels better than he has felt in some time.  He has been anemic.  He did receive transfusions in the hospital.  Patient does report he fell and injured his coccyx prior to hospitalization.  X-ray appears to have been negative.  He has done relatively well with this though does have some discomfort when he is sitting down.  He has tried a doughnut and a pad for this with some benefit.  His diabetes is doing better than it was previously.  Sugars are ranging 140-200.  He was recently spiking to 300 in the morning when he was eating sugar-free hard candy though realize there is significant carbs in this.  The last couple of mornings since discontinuing that it has been 140-150.  He is taking Lantus 45 units twice daily.  He is taking NovoLog 10 units if his blood sugar is less than 250 at meals and 15 units of his blood sugar is greater than 250 at meals.  He sees endocrinology in August.  He has been approved for continuous glucose monitor though the insurance is waiting on getting this to him until he sees endocrinology.  He needs Humalog instead of NovoLog.  He has chronic swelling in his bilateral legs.  He notes the swelling goes down in his right leg overnight.    Social History   Tobacco Use  Smoking Status Former Smoker  . Types: Cigarettes  . Last attempt to quit: 03/14/2008  . Years since quitting: 9.5  Smokeless Tobacco Never  Used     ROS see history of present illness  Objective  Physical Exam Vitals:   09/30/17 1119  BP: 130/84  Pulse: 64  Temp: 97.8 F (36.6 C)  SpO2: 99%    BP Readings from Last 3 Encounters:  09/30/17 130/84  09/25/17 (!) 143/90  09/17/17 (!) 159/73   Wt Readings from Last 3 Encounters:  09/30/17 190 lb 9.6 oz (86.5 kg)  09/25/17 182 lb (82.6 kg)  09/09/17 187 lb 2.7 oz (84.9 kg)    Physical Exam  Constitutional: No distress.  Cardiovascular: Normal rate, regular rhythm and normal heart sounds.  Pulmonary/Chest: Effort normal and breath sounds normal.  Musculoskeletal: He exhibits no edema.  Left BKA noted with well-healing wound with no signs of infection, no tenderness, mild swelling of the stump though patient reports this is improving, trace pitting edema to right lower extremity to lower shin  Neurological: He is alert.  Skin: Skin is warm and dry. He is not diaphoretic.     Assessment/Plan: Please see individual problem list.  Amputation of left lower extremity below knee [facility]) Patient is status post BKA.  He has done quite well and feels much better than he did previously.  I suspect he had some level of underlying infection for some time it was making his sugar difficult to control and causing him to not feel well.  He  will continue to see [facility] and orthopedics.  Anemia of chronic disease Patient with anemia of chronic disease also likely acute blood loss anemia following surgery.  We will recheck a CBC today.  DM (diabetes mellitus), type 2, uncontrolled w/ophthalmic complication Sugars seem to be quite a bit better controlled.  I suspect his infection was playing a role previously.  He will continue with his current regimen.  He will see endocrinology as planned.  Edema Possibly with venous insufficiency.  Could be anemia related.  Check renal function.  Check CBC.   Orders Placed This Encounter  Procedures  . Basic Metabolic Panel (BMET)  . CBC      Meds ordered this encounter  Medications  . insulin lispro (HUMALOG KWIKPEN) 100 UNIT/ML KiwkPen    Sig: Inject 0.1-0.15 mLs (10-15 Units total) into the skin 3 (three) times daily. Sliding scale, take 10 units if glucose <250, 15 units if glucose >250    Dispense:  5 pen    Refill:  3    For questions regarding this prescription please call [phone].     [name], MD [place]

## 2017-10-09 ENCOUNTER — Telehealth: Payer: Self-pay

## 2017-10-09 NOTE — Telephone Encounter (Signed)
Copied from [place] [id number]. Topic: General - Other >> Oct 09, 2017 11:27 AM Judyann Munson wrote: Reason for CRM: [place] health adviser [phone] needing information in regards to the patient appt on 09-30-17. Please advise

## 2017-10-12 ENCOUNTER — Ambulatory Visit: Payer: BLUE CROSS/BLUE SHIELD | Admitting: Family Medicine

## 2017-10-12 ENCOUNTER — Telehealth: Payer: Self-pay

## 2017-10-12 NOTE — Telephone Encounter (Signed)
Called and spoke with Chase Clark, she states she needs patients med list and labs. Informed her this was faxed to her 09/25/17. She states she did not see this in her system but was later able to see this info in another system.

## 2017-10-12 NOTE — Telephone Encounter (Signed)
Left message to return call. Ok for pec to inform her that the labs have the lab values on the it, it is not possible to only print lab ranges. Labs printed from epic show lab ranges followed by the results.

## 2017-10-12 NOTE — Telephone Encounter (Signed)
Copied from [place] [id number]. Topic: General - Other >> Oct 12, 2017  2:54 PM Mcneil, Ja-Kwan wrote: Reason for CRM: Funmi with [employer] called in stating she does not have the values of the lab results only the ranges. Funmi requests a call back. Cb# [phone]

## 2017-10-16 DIAGNOSIS — Z4781 Encounter for orthopedic aftercare following surgical amputation: Secondary | ICD-10-CM | POA: Diagnosis not present

## 2017-11-04 ENCOUNTER — Telehealth: Payer: Self-pay

## 2017-11-04 NOTE — Telephone Encounter (Signed)
Copied from [place] [id number]. Topic: General - Other >> Oct 12, 2017  2:54 PM Mcneil, Ja-Kwan wrote: Reason for CRM: Funmi with [employer] called in stating she does not have the values of the lab results only the ranges. Funmi requests a call back. Cb# [phone] >> Nov 04, 2017  9:45 AM Neva Seat wrote: [facility] - [phone] - [address] [place] Fax [phone]  Still needing pt's  A1C LDL BP  urine creatinine levels  Called and notifiedFunmi with [employer] that patient had a BMP and CBC done on  09-30-17 but no mircro urine,A1c or LDL. She verbalized understanding of this but did give her patient's last bp that was on 09-30-17.

## 2017-11-09 DIAGNOSIS — Z9889 Other specified postprocedural states: Secondary | ICD-10-CM | POA: Diagnosis not present

## 2017-11-09 DIAGNOSIS — S88112D Complete traumatic amputation at level between knee and ankle, left lower leg, subsequent encounter: Secondary | ICD-10-CM | POA: Diagnosis not present

## 2017-11-10 DIAGNOSIS — Z89512 Acquired absence of left leg below knee: Secondary | ICD-10-CM | POA: Diagnosis not present

## 2017-11-16 DIAGNOSIS — Z4781 Encounter for orthopedic aftercare following surgical amputation: Secondary | ICD-10-CM | POA: Diagnosis not present

## 2017-11-24 ENCOUNTER — Other Ambulatory Visit: Payer: Self-pay | Admitting: Family Medicine

## 2017-11-24 MED ORDER — METOPROLOL TARTRATE 100 MG PO TABS: 50.0000 mg | ORAL_TABLET | Freq: Two times a day (BID) | ORAL | 1 refills | Status: DC

## 2017-11-24 NOTE — Telephone Encounter (Signed)
Last OV 09/30/17 last filled by Lauraine Rinne PA 09/17/17 60 Chase Clark

## 2017-12-02 ENCOUNTER — Encounter: Payer: Self-pay | Admitting: Endocrinology

## 2017-12-02 ENCOUNTER — Encounter
Payer: BLUE CROSS/BLUE SHIELD | Attending: Physical Medicine & Rehabilitation | Admitting: Physical Medicine & Rehabilitation

## 2017-12-02 ENCOUNTER — Ambulatory Visit (INDEPENDENT_AMBULATORY_CARE_PROVIDER_SITE_OTHER): Payer: BLUE CROSS/BLUE SHIELD | Admitting: Endocrinology

## 2017-12-02 ENCOUNTER — Encounter

## 2017-12-02 ENCOUNTER — Encounter: Payer: Self-pay | Admitting: Physical Medicine & Rehabilitation

## 2017-12-02 VITALS — BP 120/80 | HR 67 | Ht 70.0 in | Wt 203.0 lb

## 2017-12-02 VITALS — BP 118/83 | HR 63 | Resp 14 | Ht 70.0 in | Wt 200.0 lb

## 2017-12-02 DIAGNOSIS — E1165 Type 2 diabetes mellitus with hyperglycemia: Secondary | ICD-10-CM

## 2017-12-02 DIAGNOSIS — E1142 Type 2 diabetes mellitus with diabetic polyneuropathy: Secondary | ICD-10-CM

## 2017-12-02 DIAGNOSIS — M7989 Other specified soft tissue disorders: Secondary | ICD-10-CM | POA: Insufficient documentation

## 2017-12-02 DIAGNOSIS — Z89512 Acquired absence of left leg below knee: Secondary | ICD-10-CM

## 2017-12-02 DIAGNOSIS — E1151 Type 2 diabetes mellitus with diabetic peripheral angiopathy without gangrene: Secondary | ICD-10-CM | POA: Diagnosis not present

## 2017-12-02 DIAGNOSIS — E1122 Type 2 diabetes mellitus with diabetic chronic kidney disease: Secondary | ICD-10-CM | POA: Insufficient documentation

## 2017-12-02 DIAGNOSIS — IMO0002 Reserved for concepts with insufficient information to code with codable children: Secondary | ICD-10-CM

## 2017-12-02 DIAGNOSIS — I2510 Atherosclerotic heart disease of native coronary artery without angina pectoris: Secondary | ICD-10-CM | POA: Insufficient documentation

## 2017-12-02 DIAGNOSIS — N183 Chronic kidney disease, stage 3 (moderate): Secondary | ICD-10-CM | POA: Diagnosis not present

## 2017-12-02 DIAGNOSIS — R269 Unspecified abnormalities of gait and mobility: Secondary | ICD-10-CM | POA: Insufficient documentation

## 2017-12-02 DIAGNOSIS — S88112A Complete traumatic amputation at level between knee and ankle, left lower leg, initial encounter: Secondary | ICD-10-CM

## 2017-12-02 DIAGNOSIS — I10 Essential (primary) hypertension: Secondary | ICD-10-CM | POA: Insufficient documentation

## 2017-12-02 DIAGNOSIS — E11621 Type 2 diabetes mellitus with foot ulcer: Secondary | ICD-10-CM | POA: Insufficient documentation

## 2017-12-02 DIAGNOSIS — E1139 Type 2 diabetes mellitus with other diabetic ophthalmic complication: Secondary | ICD-10-CM

## 2017-12-02 DIAGNOSIS — Z794 Long term (current) use of insulin: Secondary | ICD-10-CM

## 2017-12-02 DIAGNOSIS — E1140 Type 2 diabetes mellitus with diabetic neuropathy, unspecified: Secondary | ICD-10-CM | POA: Diagnosis not present

## 2017-12-02 LAB — POCT GLYCOSYLATED HEMOGLOBIN (HGB A1C): Hemoglobin A1C: 10.1 % — AB (ref 4.0–5.6)

## 2017-12-02 MED ORDER — INSULIN LISPRO 100 UNIT/ML (KWIKPEN): 40.0000 [IU] | PEN_INJECTOR | Freq: Three times a day (TID) | SUBCUTANEOUS | 3 refills | Status: DC

## 2017-12-02 MED ORDER — INSULIN GLARGINE 100 UNITS/ML SOLOSTAR PEN: 50.0000 [IU] | PEN_INJECTOR | Freq: Two times a day (BID) | SUBCUTANEOUS | 11 refills | Status: DC

## 2017-12-02 NOTE — Patient Instructions (Addendum)
good diet and exercise significantly improve the control of your diabetes.  please let me know if you wish to be referred to a dietician.  high blood sugar is very risky to your health.  you should see an eye doctor and dentist every year.  It is very important to get all recommended vaccinations.  Controlling your blood pressure and cholesterol drastically reduces the damage diabetes does to your body.  Those who smoke should quit.  Please discuss these with your doctor.  check your blood sugar twice a day.  vary the time of day when you check, between before the 3 meals, and at bedtime.  also check if you have symptoms of your blood sugar being too high or too low.  please keep a record of the readings and bring it to your next appointment here (or you can bring the meter itself).  You can write it on any piece of paper.  please call us sooner if your blood sugar goes below 70, or if you have a lot of readings over 200. Please continue the same lantus, and: Increase the humalog to 40 units 3 times a day (just before each meal).  Please take 10 extra for any blood sugar over 250. Please call or message [place] next week, to tell us how the blood sugar is doing Please come back for a follow-up appointment in 2 months.  Please see Vaughan Basta the same day, to consider using a pump.

## 2017-12-02 NOTE — Progress Notes (Signed)
Subjective:    Patient ID: Chase Clark, male    DOB: 08/07/1965, 52 y.o.   MRN: [id number]  HPI 52 year old right-handed male with a history of CAD with stenting, diabetes mellitus, CKD stage III presents for follow up for left BKA. He was released by Ortho.  Phantom pain is tolerable. CBGs are trending up.    Pain Inventory Average Pain 0 Pain Right Now 0 My pain is no pain  In the last 24 hours, has pain interfered with the following? General activity 0 Relation with others 0 Enjoyment of life 0 What TIME of day is your pain at its worst? no pain Sleep (in general) Good  Pain is worse with: no pain Pain improves with: no pain Relief from Meds: no pain  Mobility use a walker ability to climb steps?  yes do you drive?  yes  Function employed # of hrs/week n/a  Neuro/Psych No problems in this area  Prior Studies Any changes since last visit?  no  Physicians involved in your care Any changes since last visit?  no   Family History  Problem Relation Age of Onset  . Hyperlipidemia Mother   . Diabetes Mother   . Liver disease Mother        NASH  . Hyperlipidemia Father   . Diabetes Father   . Heart disease Father   . Hypertension Father    Social History   Socioeconomic History  . Marital status: Married    Spouse name: Not on file  . Number of children: Not on file  . Years of education: Not on file  . Highest education level: Not on file  Occupational History  . Not on file  Social Needs  . Financial resource strain: Not on file  . Food insecurity:    Worry: Not on file    Inability: Not on file  . Transportation needs:    Medical: Not on file    Non-medical: Not on file  Tobacco Use  . Smoking status: Former Smoker    Types: Cigarettes    Last attempt to quit: 03/14/2008    Years since quitting: 9.7  . Smokeless tobacco: Never Used  Substance and Sexual Activity  . Alcohol use: Yes    Comment: OCCAS  . Drug use: No  . Sexual activity:  Not on file  Lifestyle  . Physical activity:    Days per week: Not on file    Minutes per session: Not on file  . Stress: Not on file  Relationships  . Social connections:    Talks on phone: Not on file    Gets together: Not on file    Attends religious service: Not on file    Active member of club or organization: Not on file    Attends meetings of clubs or organizations: Not on file    Relationship status: Not on file  Other Topics Concern  . Not on file  Social History Narrative   Used to work as paramedic    Past Surgical History:  Procedure Laterality Date  . ABDOMINAL AORTOGRAM N/A 05/20/2016   Procedure: Abdominal Aortogram possible intervention;  Surgeon: Katha Cabal, MD;  Location: [place] [facility];  Service: Cardiovascular;  Laterality: N/A;  . AMPUTATION Left 09/05/2017   Procedure: AMPUTATION BELOW KNEE;  Surgeon: Tania Ade, MD;  Location: [place];  Service: Orthopedics;  Laterality: Left;  . ANTERIOR CERVICAL DECOMP/DISCECTOMY FUSION N/A 05/07/2017   Procedure: ANTERIOR CERVICAL DECOMPRESSION/DISCECTOMY FUSION, NTERBODY  PROSTHESIS,PLATE/SCREWS CERVICAL FIVE - CERVICAL SIX;  Surgeon: Newman Pies, MD;  Location: [place];  Service: Neurosurgery;  Laterality: N/A;  . CARDIAC CATHETERIZATION    . CATARACT EXTRACTION W/ INTRAOCULAR LENS IMPLANT    . CATARACT EXTRACTION W/PHACO Left 02/26/2017   Procedure: CATARACT EXTRACTION PHACO AND INTRAOCULAR LENS PLACEMENT (IOC);  Surgeon: Eulogio Bear, MD;  Location: [facility];  Service: Ophthalmology;  Laterality: Left;  Lot # [id number] H [place]: 00:25.3 AP%: 6.2 CDE: 1.59   . CHOLECYSTECTOMY N/A   . CORONARY ANGIOPLASTY WITH STENT PLACEMENT  2007   1st diagonal  . EPIBLEPHERON REPAIR WITH TEAR DUCT PROBING    . EYE SURGERY Right sept 29n2015   cataract extraction  . INSERTION EXPRESS TUBE SHUNT Right 09/06/2015   Procedure: INSERTION AHMED TUBE SHUNT with tutoplast allograft;  Surgeon: Eulogio Bear, MD;   Location: [facility];  Service: Ophthalmology;  Laterality: Right;  Marland Kitchen VASECTOMY     Past Medical History:  Diagnosis Date  . Arthritis   . Asthma    as child  . Coronary artery disease    DES DIAG1 11/20/09 [facility])  . Diabetes mellitus without complication ([place])   . Diabetic osteomyelitis ([place])   . Diabetic ulcer of foot with muscle involvement without evidence of necrosis ([place])    DIABETIC ULCERATIONS ASSOCIATED WITH IRRITATION LATERAL ANKLE LEFT GREATER THAN RIGHT WITH MILD CELLULITIS   . Diverticulosis   . DKA (diabetic ketoacidoses) ([place]) 08/23/2017  . Edema, lower extremity   . Fatty liver   . GERD (gastroesophageal reflux disease)   . Gilbert's disease   . Glaucoma   . Heart disease 2011   Patient has stent for 80% blockage  . Hyperlipidemia   . Hypertension   . Neuropathy   . Seasonal allergies   . Shoulder pain   . Ulcer of foot ([place]) 08.06.2014   DIABETIC ULCERATIONS ASSOCIATED WITH IRRITATION LATERAL ANKLE LEFT GREATER THAN RIGHT WITH MILD CELLULITIS   BP 118/83   Pulse 63   Resp 14   Ht 5\' 10"  (1.778 m)   Wt 200 lb (90.7 kg)   SpO2 98%   BMI 28.70 kg/m   Opioid Risk Score:   Fall Risk Score:  `1  Depression screen PHQ 2/9  Depression screen [facility] 2/9 09/25/2017 08/29/2016  Decreased Interest 0 1  Down, Depressed, Hopeless 0 0  PHQ - 2 Score 0 1  Altered sleeping - 1  Tired, decreased energy - 2  Change in appetite - 0  Feeling bad or failure about yourself  - 0  Trouble concentrating - 0  Moving slowly or fidgety/restless - 0  Suicidal thoughts - 0  PHQ-9 Score - 4    Review of Systems  Constitutional: Positive for unexpected weight change.  HENT: Negative.   Eyes: Negative.   Respiratory: Negative.   Gastrointestinal: Positive for nausea.  Endocrine: Negative.   Genitourinary: Positive for difficulty urinating.  Musculoskeletal: Positive for gait problem.  Skin: Negative.   Allergic/Immunologic: Negative.   Hematological: Negative.     Psychiatric/Behavioral: Negative.   All other systems reviewed and are negative.     Objective:   Physical Exam Constitutional: He appears well-developed and well-nourished. NAD. HENT: Normocephalic and atraumatic.  Eyes: EOM are normal. No discharge.  Cardiovascular: RRR. No JVD. Respiratory: Effort normal and breath sounds normal.   GI: Bowel sounds are normal. He exhibits no distension.  Musculoskeletal:  Left stump with tenderness and mild edema, improving Neurological: He is alert.  Alert and oriented. Motor: B/l UE 5/5 proximal to distal RLE: 5/5 proximal to distal LLE: HF, KE 5/5   Skin: Skin is warm and dry.  BKA with 2 small scabs Psychiatric: He has a normal mood and affect. His behavior is normal.     Assessment & Plan:  52 year old right-handed male with a history of CAD with stenting, diabetes mellitus, CKD stage III presents for follow up for left BKA.  1. Decreased functional mobility secondary to sepsis secondary to left diabetic foot ulcer status post left BKA 09/05/2017   Therapies after prosthesis  Released from surgery  2. Pain Management/Phantom limb pain:   Cont meds  Educated on weaning Oxy and tramadol  Cont Neurontin 600 mg twice daily, will trial 300 BID   3.  Diabetes mellitus with peripheral neuropathy   CBGs trending up, encouraged follow up with Endo, appointment later today  4.  BPH.    Controlled  5. Gait abnormality  Therapies when prosthesis  Cont knee scooter for safety

## 2017-12-02 NOTE — Progress Notes (Signed)
Subjective:    Patient ID: Chase Clark, male    DOB: [birth year], 52 y.o.   MRN: [id number]  HPI pt is referred by Dr [name], for diabetes.  Pt states DM was dx'ed in 1990; he has severe neuropathy of the lower extremities; he has associated renal failure, PAD, PDR, left BKA and CAD; he has been on insulin since 2009; pt says his diet is good, but exercise is limited by health problems; he has never had pancreatitis, pancreatic surgery, or severe hypoglycemia.  Last episode of DKA was early 2019.  He takes lantus 50 units BID, and prn humalog (averages 40 total units/day).  He says cbg's vary from 200-400.     Past Medical History:  Diagnosis Date  . Arthritis   . Asthma    as child  . Coronary artery disease    DES DIAG1 11/20/09 [facility])  . Diabetes mellitus without complication ([place])   . Diabetic osteomyelitis ([place])   . Diabetic ulcer of foot with muscle involvement without evidence of necrosis ([place])    DIABETIC ULCERATIONS ASSOCIATED WITH IRRITATION LATERAL ANKLE LEFT GREATER THAN RIGHT WITH MILD CELLULITIS   . Diverticulosis   . DKA (diabetic ketoacidoses) ([place]) 08/23/2017  . Edema, lower extremity   . Fatty liver   . GERD (gastroesophageal reflux disease)   . Gilbert's disease   . Glaucoma   . Heart disease 2011   Patient has stent for 80% blockage  . Hyperlipidemia   . Hypertension   . Neuropathy   . Seasonal allergies   . Shoulder pain   . Ulcer of foot ([place]) 08.06.2014   DIABETIC ULCERATIONS ASSOCIATED WITH IRRITATION LATERAL ANKLE LEFT GREATER THAN RIGHT WITH MILD CELLULITIS    Past Surgical History:  Procedure Laterality Date  . ABDOMINAL AORTOGRAM N/A 05/20/2016   Procedure: Abdominal Aortogram possible intervention;  Surgeon: Katha Cabal, MD;  Location: [place] [facility];  Service: Cardiovascular;  Laterality: N/A;  . AMPUTATION Left 09/05/2017   Procedure: AMPUTATION BELOW KNEE;  Surgeon: Tania Ade, MD;  Location: [place];  Service: Orthopedics;   Laterality: Left;  . ANTERIOR CERVICAL DECOMP/DISCECTOMY FUSION N/A 05/07/2017   Procedure: ANTERIOR CERVICAL DECOMPRESSION/DISCECTOMY L[place]venia Maxin PROSTHESIS,PLATE/SCREWS CERVICAL FIVE - CERVICAL SIX;  Surgeon: Newman Pies, MD;  Location: [facility];  Service: Neurosurgery;  Laterality: N/A;  . CARDIAC CATHETERIZATION    . CATARACT EXTRACTION W/ INTRAOCULAR LENS IMPLANT    . CATARACT EXTRACTION W/PHACO Left 02/26/2017   Procedure: CATARACT EXTRACTION PHACO AND INTRAOCULAR LENS PLACEMENT (IOC);  Surgeon: Eulogio Bear, MD;  Location: [facility];  Service: Ophthalmology;  Laterality: Left;  Lot # [id number] H [place]: 00:25.3 AP%: 6.2 CDE: 1.59   . CHOLECYSTECTOMY N/A   . CORONARY ANGIOPLASTY WITH STENT PLACEMENT  2007   1st diagonal  . EPIBLEPHERON REPAIR WITH TEAR DUCT PROBING    . EYE SURGERY Right sept 29n2015   cataract extraction  . INSERTION EXPRESS TUBE SHUNT Right 09/06/2015   Procedure: INSERTION AHMED TUBE SHUNT with tutoplast allograft;  Surgeon: Eulogio Bear, MD;  Location: [facility];  Service: Ophthalmology;  Laterality: Right;  Marland Kitchen VASECTOMY      Social History   Socioeconomic History  . Marital status: Married    Spouse name: Not on file  . Number of children: Not on file  . Years of education: Not on file  . Highest education level: Not on file  Occupational History  . Not on file  Social Needs  . Financial resource strain:  Not on file  . Food insecurity:    Worry: Not on file    Inability: Not on file  . Transportation needs:    Medical: Not on file    Non-medical: Not on file  Tobacco Use  . Smoking status: Former Smoker    Types: Cigarettes    Last attempt to quit: 03/14/2008    Years since quitting: 9.7  . Smokeless tobacco: Never Used  Substance and Sexual Activity  . Alcohol use: Yes    Comment: OCCAS  . Drug use: No  . Sexual activity: Not on file  Lifestyle  . Physical activity:    Days per week: Not on file    Minutes per session: Not on  file  . Stress: Not on file  Relationships  . Social connections:    Talks on phone: Not on file    Gets together: Not on file    Attends religious service: Not on file    Active member of club or organization: Not on file    Attends meetings of clubs or organizations: Not on file    Relationship status: Not on file  . Intimate partner violence:    Fear of current or ex partner: Not on file    Emotionally abused: Not on file    Physically abused: Not on file    Forced sexual activity: Not on file  Other Topics Concern  . Not on file  Social History Narrative   Used to work as paramedic     Current Outpatient Medications on File Prior to Visit  Medication Sig Dispense Refill  . acetaminophen (TYLENOL) 325 MG tablet Take 1 tablet (325 mg total) by mouth every 6 (six) hours as needed for mild pain (pain score 1-3 or temp > 100.5). 30 tablet 0  . amLODipine (NORVASC) 10 MG tablet Take 1 tablet (10 mg total) by mouth daily. 30 tablet 0  . aspirin EC 81 MG tablet Take 81 mg by mouth daily.    . bethanechol (URECHOLINE) 50 MG tablet 50 mg 3 times daily x1 week and 50 mg twice daily x1 week then 50 mg daily x1 week and stop 45 tablet 0  . brimonidine-timolol (COMBIGAN) 0.2-0.5 % ophthalmic solution Place 1 drop into the right eye every 12 (twelve) hours.     . Continuous Blood Gluc Receiver ([place]) [place] 1 each by Does not apply route as directed. Use to check blood glucose 5 times daily 1 Device 0  . Continuous Blood Gluc Sensor (DEXCOM G6 SENSOR) MISC 1 each by Does not apply route as directed. Place 1 sensor subcutaneously once every 10 days. 3 each 11  . Continuous Blood Gluc Transmit (DEXCOM G6 TRANSMITTER) MISC 1 each by Does not apply route as directed. Use to check blood glucose 5 times daily. Refill every 3 months. 1 each 3  . DUREZOL 0.05 % EMUL Place 1 drop into the left eye at bedtime  1  . esomeprazole (NEXIUM) 40 MG capsule Take 1 capsule (40 mg total) by mouth  daily. 30 capsule 0  . gabapentin (NEURONTIN) 300 MG capsule Take 2 capsules (600 mg total) by mouth 2 (two) times daily. 360 capsule 3  . latanoprost (XALATAN) 0.005 % ophthalmic solution Place 1 drop into both eyes at bedtime.  5  . losartan-hydrochlorothiazide (HYZAAR) 100-25 MG tablet Take by mouth.    . metoprolol tartrate (LOPRESSOR) 100 MG tablet Take 0.5 tablets (50 mg total) by mouth 2 (two) times daily.  90 tablet 1  . oxyCODONE (OXY IR/ROXICODONE) 5 MG immediate release tablet Take 1 tablet (5 mg total) by mouth every 6 (six) hours as needed for severe pain (pain score 7-10). 40 tablet 0  . polyethylene glycol (MIRALAX / GLYCOLAX) packet Take 17 g by mouth daily.    . rosuvastatin (CRESTOR) 10 MG tablet Take 1 tablet (10 mg total) by mouth daily. 30 tablet 3  . tamsulosin (FLOMAX) 0.4 MG CAPS capsule Take 2 capsules (0.8 mg total) by mouth daily. 30 capsule 1   No current facility-administered medications on file prior to visit.     Allergies  Allergen Reactions  . Atorvastatin Other (See Comments)    Muscle aches     Family History  Problem Relation Age of Onset  . Hyperlipidemia Mother   . Diabetes Mother   . Liver disease Mother        NASH  . Hyperlipidemia Father   . Diabetes Father   . Heart disease Father   . Hypertension Father     BP 120/80 (BP Location: Left Arm, Patient Position: Sitting, Cuff Size: Normal)   Pulse 67   Ht 5\' 10"  (1.778 m)   Wt 203 lb (92.1 kg)   SpO2 98%   BMI 29.13 kg/m     Review of Systems denies headache, chest pain, sob, n/v, muscle cramps, excessive diaphoresis, memory loss, depression, and easy bruising.  He has fatigue, blurry vision, and polyuria.  He has cold intolerance, rhinorrhea, regained weight since early 2019 illnesses and surgery.       Objective:   Physical Exam VS: see vs page GEN: no distress HEAD: head: no deformity eyes: no periorbital swelling, no proptosis external nose and ears are normal mouth: no  lesion seen NECK: supple, thyroid is not enlarged CHEST WALL: no deformity LUNGS: clear to auscultation CV: reg rate and rhythm, no murmur ABD: abdomen is soft, nontender.  no hepatosplenomegaly.  not distended.  no hernia MUSCULOSKELETAL: muscle bulk and strength are grossly normal.  no obvious joint swelling.  Gait: uses knee walker EXTEMITIES: no deformity.  no ulcer on the feet.  feet are of normal color and temp.  1+ right leg edema.  Left BKA, with healed stump PULSES: right dorsalis pedis absent.  no carotid bruit NEURO:  cn 2-12 grossly intact.   readily moves all 4's.  sensation is intact to touch on the right foot, but decreased from normal SKIN:  Normal texture and temperature.  No rash or suspicious lesion is visible.  Abrasions on the right ant tibial area NODES:  None palpable at the neck.   PSYCH: alert, well-oriented.  Does not appear anxious nor depressed.    Lab Results  Component Value Date   HGBA1C 10.1 (A) 12/02/2017   Lab Results  Component Value Date   CREATININE 1.65 (H) 09/30/2017   BUN 22 09/30/2017   NA 138 09/30/2017   K 4.7 09/30/2017   CL 103 09/30/2017   CO2 28 09/30/2017   I have reviewed outside records, and summarized: Pt was noted to have elevated a1c, and referred here.  Other probs addressed were anemia, a fall, and osteomyelitis  I personally reviewed electrocardiogram tracing (08/23/17): Indication: DKA Impression: ST  No MI.  No hypertrophy.  Slightly inverted T-waves Compared to 2018: no significant change     Assessment & Plan:  Type 1 DM, with PAD: severe exacerbation   Patient Instructions  good diet and exercise significantly improve the control of your  diabetes.  please let me know if you wish to be referred to a dietician.  high blood sugar is very risky to your health.  you should see an eye doctor and dentist every year.  It is very important to get all recommended vaccinations.  Controlling your blood pressure and cholesterol  drastically reduces the damage diabetes does to your body.  Those who smoke should quit.  Please discuss these with your doctor.  check your blood sugar twice a day.  vary the time of day when you check, between before the 3 meals, and at bedtime.  also check if you have symptoms of your blood sugar being too high or too low.  please keep a record of the readings and bring it to your next appointment here (or you can bring the meter itself).  You can write it on any piece of paper.  please call us sooner if your blood sugar goes below 70, or if you have a lot of readings over 200. Please continue the same lantus, and: Increase the humalog to 40 units 3 times a day (just before each meal).  Please take 10 extra for any blood sugar over 250. Please call or message [place] next week, to tell us how the blood sugar is doing Please come back for a follow-up appointment in 2 months.  Please see Vaughan Basta the same day, to consider using a pump.

## 2017-12-03 ENCOUNTER — Telehealth: Payer: Self-pay | Admitting: Endocrinology

## 2017-12-03 LAB — TSH: TSH: 3.13 u[IU]/mL (ref 0.35–4.50)

## 2017-12-03 NOTE — Telephone Encounter (Signed)
Patient returning call from our office. Please call patient at ph# [phone]

## 2017-12-04 NOTE — Telephone Encounter (Signed)
I called to let patient know there was no call documented so I am unsure of who called him. I see where he had recent labs but all were normal.

## 2017-12-17 DIAGNOSIS — Z4781 Encounter for orthopedic aftercare following surgical amputation: Secondary | ICD-10-CM | POA: Diagnosis not present

## 2017-12-24 ENCOUNTER — Telehealth: Payer: Self-pay | Admitting: Family Medicine

## 2017-12-24 DIAGNOSIS — S81801A Unspecified open wound, right lower leg, initial encounter: Secondary | ICD-10-CM | POA: Diagnosis not present

## 2017-12-24 NOTE — Telephone Encounter (Signed)
Would it be ok to schedule this patient on [place] schedule Monday in one of the same day?

## 2017-12-24 NOTE — Telephone Encounter (Signed)
yes

## 2017-12-24 NOTE — Telephone Encounter (Signed)
Patient has been scheduled to see Dr. Aundra Dubin on 12-28-17.

## 2017-12-24 NOTE — Telephone Encounter (Unsigned)
Copied from [place] [id number]. Topic: Quick Communication - See Telephone Encounter >> Dec 24, 2017 12:19 PM Neva Seat wrote: Urgent care this morning with infected leg. Pt was told he will need to be seen by PCP no later than Mon. Due his diabetic condition.   Please call wife back to let her know if he can be seen on Mon.  Pt will need time for medications to work and see if they are working.

## 2017-12-28 ENCOUNTER — Ambulatory Visit: Payer: BLUE CROSS/BLUE SHIELD | Attending: Surgical | Admitting: Physical Therapy

## 2017-12-28 ENCOUNTER — Ambulatory Visit: Payer: BLUE CROSS/BLUE SHIELD | Admitting: Internal Medicine

## 2017-12-28 ENCOUNTER — Encounter: Payer: Self-pay | Admitting: Internal Medicine

## 2017-12-28 ENCOUNTER — Other Ambulatory Visit: Payer: Self-pay

## 2017-12-28 VITALS — BP 110/72 | HR 68 | Temp 98.3°F | Ht 70.0 in | Wt 229.0 lb

## 2017-12-28 DIAGNOSIS — Z23 Encounter for immunization: Secondary | ICD-10-CM

## 2017-12-28 DIAGNOSIS — R2681 Unsteadiness on feet: Secondary | ICD-10-CM | POA: Insufficient documentation

## 2017-12-28 DIAGNOSIS — N183 Chronic kidney disease, stage 3 unspecified: Secondary | ICD-10-CM

## 2017-12-28 DIAGNOSIS — S81801D Unspecified open wound, right lower leg, subsequent encounter: Secondary | ICD-10-CM | POA: Diagnosis not present

## 2017-12-28 DIAGNOSIS — R2689 Other abnormalities of gait and mobility: Secondary | ICD-10-CM | POA: Insufficient documentation

## 2017-12-28 DIAGNOSIS — E1369 Other specified diabetes mellitus with other specified complication: Secondary | ICD-10-CM

## 2017-12-28 DIAGNOSIS — I10 Essential (primary) hypertension: Secondary | ICD-10-CM

## 2017-12-28 DIAGNOSIS — R293 Abnormal posture: Secondary | ICD-10-CM | POA: Insufficient documentation

## 2017-12-28 DIAGNOSIS — R600 Localized edema: Secondary | ICD-10-CM

## 2017-12-28 DIAGNOSIS — Z794 Long term (current) use of insulin: Secondary | ICD-10-CM

## 2017-12-28 DIAGNOSIS — Z9181 History of falling: Secondary | ICD-10-CM

## 2017-12-28 DIAGNOSIS — M6281 Muscle weakness (generalized): Secondary | ICD-10-CM

## 2017-12-28 MED ORDER — SULFAMETHOXAZOLE-TRIMETHOPRIM 800-160 MG PO TABS: 1.0000 | ORAL_TABLET | Freq: Two times a day (BID) | ORAL | 0 refills | Status: DC

## 2017-12-28 MED ORDER — MUPIROCIN 2 % EX OINT: 1.0000 "application " | TOPICAL_OINTMENT | Freq: Three times a day (TID) | CUTANEOUS | 0 refills | Status: DC

## 2017-12-28 NOTE — Patient Instructions (Addendum)
Bactrim total 14 days  Tdap vaccine today  Call back in 1 week  Stop Hydrogen peroxide   Use cetaphil bar soap antibacterial or dial 1-2x per day   Cellulitis, Adult Cellulitis is a skin infection. The infected area is usually red and tender. This condition occurs most often in the arms and lower legs. The infection can travel to the muscles, blood, and underlying tissue and become serious. It is very important to get treated for this condition. What are the causes? Cellulitis is caused by bacteria. The bacteria enter through a break in the skin, such as a cut, burn, insect bite, open sore, or crack. What increases the risk? This condition is more likely to occur in people who:  Have a weak defense system (immune system).  Have open wounds on the skin such as cuts, burns, bites, and scrapes. Bacteria can enter the body through these open wounds.  Are older.  Have diabetes.  Have a type of long-lasting (chronic) liver disease (cirrhosis) or kidney disease.  Use IV drugs.  What are the signs or symptoms? Symptoms of this condition include:  Redness, streaking, or spotting on the skin.  Swollen area of the skin.  Tenderness or pain when an area of the skin is touched.  Warm skin.  Fever.  Chills.  Blisters.  How is this diagnosed? This condition is diagnosed based on a medical history and physical exam. You may also have tests, including:  Blood tests.  Lab tests.  Imaging tests.  How is this treated? Treatment for this condition may include:  Medicines, such as antibiotic medicines or antihistamines.  Supportive care, such as rest and application of cold or warm cloths (cold or warm compresses) to the skin.  Hospital care, if the condition is severe.  The infection usually gets better within 1-2 days of treatment. Follow these instructions at home:  Take over-the-counter and prescription medicines only as told by your health care provider.  If you were  prescribed an antibiotic medicine, take it as told by your health care provider. Do not stop taking the antibiotic even if you start to feel better.  Drink enough fluid to keep your urine clear or pale yellow.  Do not touch or rub the infected area.  Raise (elevate) the infected area above the level of your heart while you are sitting or lying down.  Apply warm or cold compresses to the affected area as told by your health care provider.  Keep all follow-up visits as told by your health care provider. This is important. These visits let your health care provider make sure a more serious infection is not developing. Contact a health care provider if:  You have a fever.  Your symptoms do not improve within 1-2 days of starting treatment.  Your bone or joint underneath the infected area becomes painful after the skin has healed.  Your infection returns in the same area or another area.  You notice a swollen bump in the infected area.  You develop new symptoms.  You have a general ill feeling (malaise) with muscle aches and pains. Get help right away if:  Your symptoms get worse.  You feel very sleepy.  You develop vomiting or diarrhea that persists.  You notice red streaks coming from the infected area.  Your red area gets larger or turns dark in color. This information is not intended to replace advice given to you by your health care provider. Make sure you discuss any questions you have  with your health care provider. Document Released: 01/08/2005 Document Revised: 08/09/2015 Document Reviewed: 02/07/2015 Elsevier Interactive Patient Education  2018 [place], Adult Taking care of your wound properly can help to prevent pain and infection. It can also help your wound to heal more quickly. How is this treated? Wound care  Follow instructions from your health care provider about how to take care of your wound. Make sure you: ? Wash your hands with soap  and water before you change the bandage (dressing). If soap and water are not available, use hand sanitizer. ? Change your dressing as told by your health care provider. ? Leave stitches (sutures), skin glue, or adhesive strips in place. These skin closures may need to stay in place for 2 weeks or longer. If adhesive strip edges start to loosen and curl up, you may trim the loose edges. Do not remove adhesive strips completely unless your health care provider tells you to do that.  Check your wound area every day for signs of infection. Check for: ? More redness, swelling, or pain. ? More fluid or blood. ? Warmth. ? Pus or a bad smell.  Ask your health care provider if you should clean the wound with mild soap and water. Doing this may include: ? Using a clean towel to pat the wound dry after cleaning it. Do not rub or scrub the wound. ? Applying a cream or ointment. Do this only as told by your health care provider. ? Covering the incision with a clean dressing.  Ask your health care provider when you can leave the wound uncovered. Medicines   If you were prescribed an antibiotic medicine, cream, or ointment, take or use the antibiotic as told by your health care provider. Do not stop taking or using the antibiotic even if your condition improves.  Take over-the-counter and prescription medicines only as told by your health care provider. If you were prescribed pain medicine, take it at least 30 minutes before doing any wound care or as told by your health care provider. General instructions  Return to your normal activities as told by your health care provider. Ask your health care provider what activities are safe.  Do not scratch or pick at the wound.  Keep all follow-up visits as told by your health care provider. This is important.  Eat a diet that includes protein, vitamin A, vitamin C, and other nutrient-rich foods. These help the wound heal: ? Protein-rich foods include meat,  dairy, beans, nuts, and other sources. ? Vitamin A-rich foods include carrots and dark green, leafy vegetables. ? Vitamin C-rich foods include citrus, tomatoes, and other fruits and vegetables. ? Nutrient-rich foods have protein, carbohydrates, fat, vitamins, or minerals. Eat a variety of healthy foods including vegetables, fruits, and whole grains. Contact a health care provider if:  You received a tetanus shot and you have swelling, severe pain, redness, or bleeding at the injection site.  Your pain is not controlled with medicine.  You have more redness, swelling, or pain around the wound.  You have more fluid or blood coming from the wound.  Your wound feels warm to the touch.  You have pus or a bad smell coming from the wound.  You have a fever or chills.  You are nauseous or you vomit.  You are dizzy. Get help right away if:  You have a red streak going away from your wound.  The edges of the wound open up and separate.  Your  wound is bleeding and the bleeding does not stop with gentle pressure.  You have a rash.  You faint.  You have trouble breathing. This information is not intended to replace advice given to you by your health care provider. Make sure you discuss any questions you have with your health care provider. Document Released: 01/08/2008 Document Revised: 11/28/2015 Document Reviewed: 10/16/2015 Elsevier Interactive Patient Education  2017 [employer].

## 2017-12-28 NOTE — Progress Notes (Signed)
Pre visit review using our clinic review tool, if applicable. No additional management support is needed unless otherwise documented below in the visit note. 

## 2017-12-28 NOTE — Progress Notes (Addendum)
Chief Complaint  Patient presents with  . Follow-up    Right leg infection   F/u  1. Right leg wound after hitting let on manual scooter device during moving last week noted redness warmth and not feeling well Thursday went to urgent care given 10 day bactrim supply and is on day 5/10. Using otc topical abx to wound and hydrogen peroxide. Wound has improved on bactrim and not spread beyond line drawn on leg but still red around ulcerated area  2. DM uncontrolled A1C 12/02/17 10.1 on lantus 50 mg bid and humalog 15-20 tid max 40 considering insulin pump with endocrine Dr. Loanne Drilling appt 01/2018   3. Chronic right leg edema per pt no change he has noticed since been on humalog instead of novolog leg swelling worse. He is also on norvasc 10 mg qd. Denies leg pain    Review of Systems  Constitutional: Negative for fever and weight loss.  HENT: Negative for hearing loss.   Eyes: Negative for blurred vision.  Respiratory: Negative for shortness of breath.   Cardiovascular: Positive for leg swelling. Negative for chest pain.  Skin:       +right leg cellulitis  L leg AKA   Past Medical History:  Diagnosis Date  . Arthritis   . Asthma    as child  . Coronary artery disease    DES DIAG1 11/20/09 [facility])  . Diabetes mellitus without complication ([place])   . Diabetic osteomyelitis ([place])   . Diabetic ulcer of foot with muscle involvement without evidence of necrosis ([place])    DIABETIC ULCERATIONS ASSOCIATED WITH IRRITATION LATERAL ANKLE LEFT GREATER THAN RIGHT WITH MILD CELLULITIS   . Diverticulosis   . DKA (diabetic ketoacidoses) ([place]) 08/23/2017  . Edema, lower extremity   . Fatty liver   . GERD (gastroesophageal reflux disease)   . Gilbert's disease   . Glaucoma   . Heart disease 2011   Patient has stent for 80% blockage  . Hyperlipidemia   . Hypertension   . Neuropathy   . Seasonal allergies   . Shoulder pain   . Ulcer of foot ([place]) 08.06.2014   DIABETIC ULCERATIONS ASSOCIATED WITH  IRRITATION LATERAL ANKLE LEFT GREATER THAN RIGHT WITH MILD CELLULITIS   Past Surgical History:  Procedure Laterality Date  . ABDOMINAL AORTOGRAM N/A 05/20/2016   Procedure: Abdominal Aortogram possible intervention;  Surgeon: Katha Cabal, MD;  Location: [facility];  Service: Cardiovascular;  Laterality: N/A;  . AMPUTATION Left 09/05/2017   Procedure: AMPUTATION BELOW KNEE;  Surgeon: Tania Ade, MD;  Location: [facility];  Service: Orthopedics;  Laterality: Left;  . ANTERIOR CERVICAL DECOMP/DISCECTOMY FUSION N/A 05/07/2017   Procedure: ANTERIOR CERVICAL DECOMPRESSION/DISCECTOMY Luevenia Maxin PROSTHESIS,PLATE/SCREWS CERVICAL FIVE - CERVICAL SIX;  Surgeon: Newman Pies, MD;  Location: [facility];  Service: Neurosurgery;  Laterality: N/A;  . CARDIAC CATHETERIZATION    . CATARACT EXTRACTION W/ INTRAOCULAR LENS IMPLANT    . CATARACT EXTRACTION W/PHACO Left 02/26/2017   Procedure: CATARACT EXTRACTION PHACO AND INTRAOCULAR LENS PLACEMENT (IOC);  Surgeon: Eulogio Bear, MD;  Location: [facility];  Service: Ophthalmology;  Laterality: Left;  Lot # [id number] H [place]: 00:25.3 AP%: 6.2 CDE: 1.59   . CHOLECYSTECTOMY N/A   . CORONARY ANGIOPLASTY WITH STENT PLACEMENT  2007   1st diagonal  . EPIBLEPHERON REPAIR WITH TEAR DUCT PROBING    . EYE SURGERY Right sept 29n2015   cataract extraction  . INSERTION EXPRESS TUBE SHUNT Right 09/06/2015   Procedure: INSERTION AHMED TUBE SHUNT with  tutoplast allograft;  Surgeon: Eulogio Bear, MD;  Location: [facility];  Service: Ophthalmology;  Laterality: Right;  Marland Kitchen VASECTOMY     Family History  Problem Relation Age of Onset  . Hyperlipidemia Mother   . Diabetes Mother   . Liver disease Mother        NASH  . Hyperlipidemia Father   . Diabetes Father   . Heart disease Father   . Hypertension Father    Social History   Socioeconomic History  . Marital status: Married    Spouse name: Not on file  . Number of children: Not on file  . Years of  education: Not on file  . Highest education level: Not on file  Occupational History  . Not on file  Social Needs  . Financial resource strain: Not on file  . Food insecurity:    Worry: Not on file    Inability: Not on file  . Transportation needs:    Medical: Not on file    Non-medical: Not on file  Tobacco Use  . Smoking status: Former Smoker    Types: Cigarettes    Last attempt to quit: 03/14/2008    Years since quitting: 9.7  . Smokeless tobacco: Never Used  Substance and Sexual Activity  . Alcohol use: Yes    Comment: OCCAS  . Drug use: No  . Sexual activity: Not on file  Lifestyle  . Physical activity:    Days per week: Not on file    Minutes per session: Not on file  . Stress: Not on file  Relationships  . Social connections:    Talks on phone: Not on file    Gets together: Not on file    Attends religious service: Not on file    Active member of club or organization: Not on file    Attends meetings of clubs or organizations: Not on file    Relationship status: Not on file  . Intimate partner violence:    Fear of current or ex partner: Not on file    Emotionally abused: Not on file    Physically abused: Not on file    Forced sexual activity: Not on file  Other Topics Concern  . Not on file  Social History Narrative   Used to work as paramedic    Current Meds  Medication Sig  . acetaminophen (TYLENOL) 325 MG tablet Take 1 tablet (325 mg total) by mouth every 6 (six) hours as needed for mild pain (pain score 1-3 or temp > 100.5).  Marland Kitchen amLODipine (NORVASC) 10 MG tablet Take 1 tablet (10 mg total) by mouth daily.  Marland Kitchen aspirin EC 81 MG tablet Take 81 mg by mouth daily.  . bethanechol (URECHOLINE) 50 MG tablet 50 mg 3 times daily x1 week and 50 mg twice daily x1 week then 50 mg daily x1 week and stop  . brimonidine-timolol (COMBIGAN) 0.2-0.5 % ophthalmic solution Place 1 drop into the right eye every 12 (twelve) hours.   . Continuous Blood Gluc Receiver ([place]) [place] 1 each by Does not apply route as directed. Use to check blood glucose 5 times daily  . Continuous Blood Gluc Sensor (DEXCOM G6 SENSOR) MISC 1 each by Does not apply route as directed. Place 1 sensor subcutaneously once every 10 days.  . Continuous Blood Gluc Transmit (DEXCOM G6 TRANSMITTER) MISC 1 each by Does not apply route as directed. Use to check blood glucose 5 times daily. Refill every 3 months.  Marland Kitchen  DUREZOL 0.05 % EMUL Place 1 drop into the left eye at bedtime  . esomeprazole (NEXIUM) 40 MG capsule Take 1 capsule (40 mg total) by mouth daily.  Marland Kitchen gabapentin (NEURONTIN) 300 MG capsule Take 2 capsules (600 mg total) by mouth 2 (two) times daily.  . insulin glargine (LANTUS) 100 unit/mL SOPN Inject 0.5 mLs (50 Units total) into the skin 2 (two) times daily.  . insulin lispro (HUMALOG KWIKPEN) 100 UNIT/ML KiwkPen Inject 0.4 mLs (40 Units total) into the skin 3 (three) times daily with meals. And pen needles 4/day  . latanoprost (XALATAN) 0.005 % ophthalmic solution Place 1 drop into both eyes at bedtime.  Marland Kitchen losartan-hydrochlorothiazide (HYZAAR) 100-25 MG tablet Take by mouth.  . metoprolol tartrate (LOPRESSOR) 100 MG tablet Take 0.5 tablets (50 mg total) by mouth 2 (two) times daily.  Marland Kitchen oxyCODONE (OXY IR/ROXICODONE) 5 MG immediate release tablet Take 1 tablet (5 mg total) by mouth every 6 (six) hours as needed for severe pain (pain score 7-10).  . polyethylene glycol (MIRALAX / GLYCOLAX) packet Take 17 g by mouth daily.  . rosuvastatin (CRESTOR) 10 MG tablet Take 1 tablet (10 mg total) by mouth daily.  . Sulfamethoxazole-Trimethoprim (BACTRIM PO) Take by mouth.  . tamsulosin (FLOMAX) 0.4 MG CAPS capsule Take 2 capsules (0.8 mg total) by mouth daily.   Allergies  Allergen Reactions  . Atorvastatin Other (See Comments)    Muscle aches    Recent Results (from the past 2160 hour(s))  Basic Metabolic Panel (BMET)     Status: Abnormal   Collection Time: 09/30/17 12:07 PM  Result  Value Ref Range   Sodium 138 135 - 145 mEq/L   Potassium 4.7 3.5 - 5.1 mEq/L   Chloride 103 96 - 112 mEq/L   CO2 28 19 - 32 mEq/L   Glucose, Bld 121 (H) 70 - 99 mg/dL   BUN 22 6 - 23 mg/dL   Creatinine, Ser 1.65 (H) 0.40 - 1.50 mg/dL   Calcium 9.5 8.4 - 10.5 mg/dL   GFR 46.41 (L) >60.00 mL/min  CBC     Status: Abnormal   Collection Time: 09/30/17 12:07 PM  Result Value Ref Range   WBC 8.6 4.0 - 10.5 K/uL   RBC 3.43 (L) 4.22 - 5.81 Mil/uL   Platelets 261.0 150.0 - 400.0 K/uL   Hemoglobin 10.1 (L) 13.0 - 17.0 g/dL   HCT 29.6 (L) 39.0 - 52.0 %   MCV 86.4 78.0 - 100.0 fl   MCHC 34.0 30.0 - 36.0 g/dL   RDW 15.9 (H) 11.5 - 15.5 %  POCT glycosylated hemoglobin (Hb A1C)     Status: Abnormal   Collection Time: 12/02/17  3:55 PM  Result Value Ref Range   Hemoglobin A1C 10.1 (A) 4.0 - 5.6 %   HbA1c POC (<> result, manual entry)     HbA1c, POC (prediabetic range)     HbA1c, POC (controlled diabetic range)    TSH     Status: None   Collection Time: 12/02/17  4:38 PM  Result Value Ref Range   TSH 3.13 0.35 - 4.50 uIU/mL   Objective  Body mass index is 32.86 kg/m. Wt Readings from Last 3 Encounters:  12/28/17 229 lb (103.9 kg)  12/02/17 203 lb (92.1 kg)  12/02/17 200 lb (90.7 kg)   Temp Readings from Last 3 Encounters:  12/28/17 98.3 F (36.8 C) (Oral)  09/30/17 97.8 F (36.6 C) (Oral)  09/17/17 98.5 F (36.9 C) (Oral)   BP Readings  from Last 3 Encounters:  12/28/17 110/72  12/02/17 120/80  12/02/17 118/83   Pulse Readings from Last 3 Encounters:  12/28/17 68  12/02/17 67  12/02/17 63    Physical Exam  Constitutional: He is oriented to person, place, and time. Vital signs are normal. He appears well-developed and well-nourished. He is cooperative.  HENT:  Head: Normocephalic and atraumatic.  Mouth/Throat: Oropharynx is clear and moist and mucous membranes are normal.  Eyes: Pupils are equal, round, and reactive to light. Conjunctivae are normal.  Cardiovascular:  Normal rate, regular rhythm and normal heart sounds.  Pulmonary/Chest: Effort normal and breath sounds normal.  Neurological: He is alert and oriented to person, place, and time.  Using manual scooter device   Skin: Skin is warm and dry. There is erythema.     Psychiatric: He has a normal mood and affect. His speech is normal and behavior is normal. Judgment and thought content normal. Cognition and memory are normal.  Nursing note and vitals reviewed.   Assessment   1. Right leg wound/ulceration with cellulitis improving and right knee blister  2. DM uncontrolled A1C 10.1 with CKD3 s/p left AKA 3. Chronic leg edema  4. HTN controlled  Plan   1. Continue bactrim ds bid x 14 days total  Tdap given today bactroban Antibacterial soap  Stop peroxide  If not getting better refer wound clinic again was going to [facility]  F/u with PCP 01/18/18 sooner if needed call in 1 week Requested wound culture from [facility]   2. lantus 50 bid, humalog 15-20 tid max 40  F/u endocrine considering pump  3. Monitor  Consider reduce norvasc to 5 mg to see if helps in future with PCP 4. See #3   Provider: Dr. Olivia Mackie McLean-Scocuzza-[facility] Medicine

## 2017-12-29 NOTE — Therapy (Signed)
[facility] [address] [place], [place], [address] Phone: [phone]   Fax:  [phone]  Physical Therapy Evaluation  Patient Details  Name: Chase Clark MRN: [id number] Date of Birth: [birth year] Referring Provider: Grier Mitts, PA   Encounter Date: 12/28/2017  PT End of Session - 12/29/17 0922    Visit Number  1    Number of Visits  21    Date for PT Re-Evaluation  03/19/18    Authorization Type  BCBS met deductible 100% covered.  Pt reports insurance changing to [facility] with start date 01/12/2018.     PT Start Time  1620    PT Stop Time  1705    PT Time Calculation (min)  45 min    Equipment Utilized During Treatment  Gait belt    Activity Tolerance  Patient tolerated treatment well    Behavior During Therapy  WFL for tasks assessed/performed       Past Medical History:  Diagnosis Date  . Arthritis   . Asthma    as child  . Coronary artery disease    DES DIAG1 11/20/09 [facility])  . Diabetes mellitus without complication ([place])   . Diabetic osteomyelitis ([place])   . Diabetic ulcer of foot with muscle involvement without evidence of necrosis ([place])    DIABETIC ULCERATIONS ASSOCIATED WITH IRRITATION LATERAL ANKLE LEFT GREATER THAN RIGHT WITH MILD CELLULITIS   . Diverticulosis   . DKA (diabetic ketoacidoses) ([place]) 08/23/2017  . Edema, lower extremity   . Fatty liver   . GERD (gastroesophageal reflux disease)   . Gilbert's disease   . Glaucoma   . Heart disease 2011   Patient has stent for 80% blockage  . Hyperlipidemia   . Hypertension   . Neuropathy   . Seasonal allergies   . Shoulder pain   . Ulcer of foot ([place]) 08.06.2014   DIABETIC ULCERATIONS ASSOCIATED WITH IRRITATION LATERAL ANKLE LEFT GREATER THAN RIGHT WITH MILD CELLULITIS    Past Surgical History:  Procedure Laterality Date  . ABDOMINAL AORTOGRAM N/A 05/20/2016   Procedure: Abdominal Aortogram possible intervention;  Surgeon: Katha Cabal, MD;  Location: [place];  Service: Cardiovascular;  Laterality: N/A;  . AMPUTATION Left 09/05/2017   Procedure: AMPUTATION BELOW KNEE;  Surgeon: Tania Ade, MD;  Location: [facility];  Service: Orthopedics;  Laterality: Left;  . ANTERIOR CERVICAL DECOMP/DISCECTOMY FUSION N/A 05/07/2017   Procedure: ANTERIOR CERVICAL DECOMPRESSION/DISCECTOMY Luevenia Maxin PROSTHESIS,PLATE/SCREWS CERVICAL FIVE - CERVICAL SIX;  Surgeon: Newman Pies, MD;  Location: [facility];  Service: Neurosurgery;  Laterality: N/A;  . CARDIAC CATHETERIZATION    . CATARACT EXTRACTION W/ INTRAOCULAR LENS IMPLANT    . CATARACT EXTRACTION W/PHACO Left 02/26/2017   Procedure: CATARACT EXTRACTION PHACO AND INTRAOCULAR LENS PLACEMENT (IOC);  Surgeon: Eulogio Bear, MD;  Location: [facility];  Service: Ophthalmology;  Laterality: Left;  Lot # [id number] H [place]: 00:25.3 AP%: 6.2 CDE: 1.59   . CHOLECYSTECTOMY N/A   . CORONARY ANGIOPLASTY WITH STENT PLACEMENT  2007   1st diagonal  . EPIBLEPHERON REPAIR WITH TEAR DUCT PROBING    . EYE SURGERY Right sept 29n2015   cataract extraction  . INSERTION EXPRESS TUBE SHUNT Right 09/06/2015   Procedure: INSERTION AHMED TUBE SHUNT with tutoplast allograft;  Surgeon: Eulogio Bear, MD;  Location: [facility];  Service: Ophthalmology;  Laterality: Right;  Marland Kitchen VASECTOMY      There were no vitals filed for this visit.   Subjective Assessment - 12/28/17  1626    Subjective  This 52yo male was referred by Grier Mitts, PA with Left Transtibial Amputaiton on 12/18/2017. Patient underwent a left Transtibial Amputation on 09/05/2017. He underweant surgery with Cervical fusion C5-6. He recieved prosthesis 12/08/2017.     Pertinent History  LTTA, C5-6 fusion sg, DM, neuropathy, CKD, CAD, arthritis, HTN    Limitations  Lifting;Standing;Walking;House hold activities    Patient Stated Goals  He would like to use prosthesis to increase mobility. 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     Currently in Pain?  No/denies         [facility] PT Assessment - 12/28/17 1615      Assessment   Medical Diagnosis  Left Transtibial Amputation    Referring Provider  Grier Mitts, PA    Onset Date/Surgical Date  12/08/17   prosthesis delivery   Hand Dominance  Right    Prior Therapy  none since hospital      Precautions   Precautions  Fall      Balance Screen   Has the patient fallen in the past 6 months  Yes    How many times?  1    Has the patient had a decrease in activity level because of a fear of falling?   No    Is the patient reluctant to leave their home because of a fear of falling?   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, climbing    Leisure  band guitar,       Posture/Postural Control   Posture/Postural Control  Postural limitations    Postural Limitations  Rounded Shoulders;Forward head;Flexed trunk;Weight shift right      ROM / Strength   AROM / PROM / Strength  AROM;Strength      AROM   Overall AROM   Deficits    Overall AROM Comments  left knee extension  -10      Strength   Overall Strength  Deficits    Overall Strength Comments  BUEs grossly 5/5    Right/Left Hip  Right;Left    Right Hip Flexion  5/5    Right Hip Extension  4/5    Right Hip ABduction  4/5    Left Hip Flexion  4/5    Left Hip Extension  3+/5    Left Hip ABduction  3+/5    Right/Left Knee  Right;Left    Right Knee Flexion  5/5     Right Knee Extension  5/5    Left Knee Flexion  4-/5    Left Knee Extension  4/5    Right/Left Ankle  Right    Right Ankle Dorsiflexion  3/5      Transfers   Transfers  Sit to Stand;Stand to Sit    Sit to Stand  5: Supervision;With upper extremity assist;With armrests;From chair/3-in-1    Stand to Sit  5: Supervision;With upper extremity assist;With armrests;To chair/3-in-1      Ambulation/Gait   Ambulation/Gait  Yes    Ambulation/Gait Assistance  5: Supervision   with BUE support / RW   Ambulation/Gait Assistance Details  excessive UE weight bearing on RW & partial weight on prosthesis    Ambulation Distance (Feet)  100 Feet    Assistive device  Rolling walker;Prosthesis    Gait Pattern  Step-to pattern;Decreased step length - right;Decreased stance time - left;Decreased stride length;Decreased hip/knee flexion - left;Decreased weight shift to left;Left hip hike;Left circumduction;Antalgic;Lateral hip instability;Trunk flexed;Abducted - left;Poor foot clearance - left    Ambulation Surface  Indoor;Level    Gait velocity  1.52 ft/sec    Stairs  Yes    Stairs Assistance  5: Supervision    Stairs Assistance Details (indicate cue type and reason)  PT instructed with verbal & tactile cues in technique    Stair Management Technique  Two rails;Step to pattern;Forwards    Number of Stairs  4    Ramp  4: Min assist   RW & prosthesis   Ramp Details (indicate cue type and reason)  demo & verbal cues in technique with TTA prosthesis    Curb  5: Supervision   RW & prosthesis   Curb Details (indicate cue type and reason)  demo & verbal cues in technique with TTA prosthesis      Standardized Balance Assessment   Standardized Balance Assessment  Berg Balance Test      Berg Balance Test   Sit to Stand  Able to stand  independently using hands    Standing Unsupported  Able to stand 2 minutes with supervision    Sitting with Back Unsupported but Feet Supported on Floor or Stool  Able to sit  safely and securely 2 minutes    Stand to Sit  Controls descent by using hands    Transfers  Able to transfer safely, definite need of hands    Standing Unsupported with Eyes Closed  Able to stand 3 seconds    Standing Ubsupported with Feet Together  Needs help to attain position but able to stand for 30 seconds with feet together    From Standing, Reach Forward with Outstretched Arm  Reaches forward but needs supervision    From Standing Position, Pick up Object from Floor  Unable to pick up and needs supervision    From Standing Position, Turn to Look Behind Over each Shoulder  Needs supervision when turning    Turn 360 Degrees  Needs assistance while turning    Standing Unsupported, Alternately Place Feet on Step/Stool  Needs assistance to keep from falling or unable to try    Standing Unsupported, One Foot in Front  Loses balance while stepping or standing    Standing on One Leg  Unable to try or needs assist to prevent fall    Total Score  22      Prosthetics Assessment - 12/28/17 [place] with  Skin check;Residual limb care;Prosthetic cleaning;Ply sock cleaning;Correct ply sock adjustment;Proper wear schedule/adjustment;Proper weight-bearing schedule/adjustment    Donning prosthesis   Supervision    Doffing prosthesis   Supervision    Current prosthetic wear tolerance (days/week)   He reports wear 11 of 19 days    Current prosthetic wear tolerance (#hours/day)   He reports wear 30 minutes except 4 hrs on 12/27/2017    Current prosthetic weight-bearing tolerance (hours/day)  Patient tolerated 10 minutes with partial weight on prosthesis without limb pain or issues.     Edema  pitting edema    Residual limb condition   61m dry scab on distal scar and 345msuperficial opening on scar midline. bulbous to cylinderical shape. Minimal hair growth. Dry skin.                Objective measurements completed on examination: See above findings.       [facility] - [facility] 1615      Prosthetics   Prosthetic Care Comments   Initiate wear 2-3 hrs 2 x/day with increase weekly if no issues.    Education Provided  Skin check;Residual limb care;Prosthetic cleaning;Ply sock cleaning;Correct ply sock adjustment;Proper Donning;Proper Doffing;Proper wear schedule/adjustment;Proper weight-bearing schedule/adjustment    Person(s) Educated  Patient    Education Method  Explanation;Demonstration;Tactile cues;Verbal cues    Education Method  Verbalized understanding;Returned demonstration;Tactile cues required;Verbal cues required;Needs further instruction               PT Short Term Goals - 12/28/17 1658      PT SHORT TERM GOAL #1   Title  Patient verbalizes proper cleaning of prosthesis and demonstrates proper donning. (All STGs Target Date: 02/05/2018)    Time  4    Period  Weeks    Status  New    Target Date  02/05/18      PT SHORT TERM GOAL #2   Title  Patient tolerates prosthesis wear >10 hrs /day without skin issues.    Time  4    Period  Weeks    Status  New    Target Date  02/05/18      PT SHORT TERM GOAL #3   Title  Patient ambulates 500' and negotiates ramps, curbs and stairs (step-to pattern) with prosthesis & RW modified indpendent.     Time  4    Period  Weeks    Status  New    Target Date  02/05/18      PT SHORT TERM GOAL #4   Title  Patient ambulates 200' negotiating around obstacles and scanning environment with minA.     Time  4    Period  Weeks    Status  New    Target Date  02/05/18      PT SHORT TERM GOAL #5   Title  Patient picks up objects from floor without UE support with supervision.     Time  4    Period  Weeks    Status  New    Target Date  02/05/18        PT Long Term Goals - 12/29/17 1551      PT LONG TERM GOAL #1   Title  Patient verbalizes & demonstrates proper prosthetic care to enable safe function with prosthesis. (All LTGs Target Date 03/05/2018)     Time  8    Period  Weeks    Status  New    Target Date  03/05/18      PT LONG TERM GOAL #2   Title  Patient tolerates wear of prosthesis >90% of awake hours without skin issues or limb pain to enable function during his day.     Time  8    Period  Weeks    Status  New    Target Date  03/05/18      PT LONG TERM GOAL #3   Title  Berg Balance >45/56 to  indicate lower fall risk.     Time  8    Period  Weeks    Status  New    Target Date  03/05/18      PT LONG TERM GOAL #4   Title  Patient ambulates 500' outdoors including grass, ramps & curbs with cane or less and prosthesis to enable community mobility.     Time  8    Status  New    Target Date  03/05/18      PT LONG TERM GOAL #5   Title  Patient lifts & carries 20# box with prosthesis only modified independent.     Time  8    Period  Weeks    Status  New    Target Date  03/05/18             Plan - 12/29/17 1310    Clinical Impression Statement  This 52yo male underwent a left Transtibial Amputation on 09/05/2017 vascular issues and received his first prosthesis on 12/08/2017. He is dependent in proper use & care of prosthesis which increases risk of skin issues. He has worn prosthesis 12 of 19 days since delivery for 30 minutes mainly except one time for 4 hours. Limited wear limits function during the day and increases fall risk. Patient has high fall risk and dependency in standing ADLs as noted by [employer] 22/56. His gait is dependent on walker for excessive support with deviations & gait velocity of 1.52 ft/sec indicating high fall risk. Patient would benefit from skilled care to progress function & safety with prosthesis    History and Personal Factors relevant to plan of care:  LTTA, C5-6 fusion sg, DM, neuropathy, CKD, CAD, arthritis, HTN    Clinical Presentation  Stable    Clinical Decision Making  Low    Rehab Potential  Good    PT Frequency  2x / week   hold until 01/12/2018 when new insurance starts per pt  request   PT Duration  8 weeks   hold until 01/12/2018 when new insurance starts per pt request   PT Treatment/Interventions  ADLs/Self Care Home Management;Canalith Repostioning;DME Instruction;Gait training;Stair training;Functional mobility training;Therapeutic activities;Therapeutic exercise;Balance training;Neuromuscular re-education;Patient/family education;Prosthetic Training;Manual techniques;Vestibular    PT Next Visit Plan  review prosthetic care, prosthetic gait with RW, progress to cane as able    Consulted and Agree with Plan of Care  Patient       Patient will benefit from skilled therapeutic intervention in order to improve the following deficits and impairments:  Abnormal gait, Decreased activity tolerance, Decreased balance, Decreased endurance, Decreased knowledge of use of DME, Decreased mobility, Decreased range of motion, Dizziness, Impaired flexibility, Postural dysfunction, Prosthetic Dependency  Visit Diagnosis: Other abnormalities of gait and mobility  Unsteadiness on feet  Muscle weakness (generalized)  Abnormal posture  History of falling     Problem List Patient Active Problem List   Diagnosis Date Noted  . Abnormality of gait 12/02/2017  . Labile blood pressure   . Labile blood glucose   . Amputation of left lower extremity below knee ([place]) 09/09/2017  . Benign prostatic hyperplasia   . Hyperlipidemia   . Stage 3 chronic kidney disease ([place])   . Neuropathic pain   . Osteomyelitis of left foot ([place])   . Coronary artery disease involving native coronary artery of native heart without angina pectoris   . Benign essential HTN   . Anemia of chronic disease   . Leukocytosis   . Pressure  injury of skin 08/24/2017  . Cervical spondylosis with myelopathy and radiculopathy 05/07/2017  . ARF (acute renal failure) ([place]) 04/30/2017  . Upper respiratory infection 04/29/2017  . Fatty liver 04/04/2017  . DDD (degenerative disc disease), cervical 01/13/2017   . PAD (peripheral artery disease) ([place]) 08/07/2016  . Ulcer of ankle ([place]) 08/07/2016  . Chronic diarrhea 05/17/2016  . Hyponatremia 05/17/2016  . Anemia 01/12/2014  . Disorder of ligament of ankle 07/31/2013  . Edema 06/28/2013  . 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 Obesity (BMI 30-39.9) 06/12/2013  . Pain in joint, shoulder region 06/12/2013  . Statin intolerance 06/10/2013  . DM (diabetes mellitus), type 2, uncontrolled w/ophthalmic complication ([place]) 31/54/0086  . Hyperlipidemia associated with type 2 diabetes mellitus ([place]) 07/17/2006  . GILBERT'S SYNDROME 07/17/2006  . Essential hypertension 07/17/2006  . Coronary atherosclerosis 07/17/2006  . OSTEOARTHRITIS 07/17/2006  . COUGH 07/17/2006    WALDRON,ROBIN PT, DPT 12/29/2017, 5:04 PM  [facility] [address] [place], [place], [address] Phone: [phone]   Fax:  [phone]  Name: Chase Clark MRN: [id number] Date of Birth: Jan 07, 1966

## 2018-01-12 ENCOUNTER — Ambulatory Visit: Payer: 59 | Attending: Surgical | Admitting: Physical Therapy

## 2018-01-12 ENCOUNTER — Other Ambulatory Visit: Payer: Self-pay | Admitting: Family Medicine

## 2018-01-12 ENCOUNTER — Encounter: Payer: Self-pay | Admitting: Physical Therapy

## 2018-01-12 DIAGNOSIS — R2681 Unsteadiness on feet: Secondary | ICD-10-CM

## 2018-01-12 DIAGNOSIS — M6281 Muscle weakness (generalized): Secondary | ICD-10-CM | POA: Diagnosis not present

## 2018-01-12 DIAGNOSIS — Z9181 History of falling: Secondary | ICD-10-CM

## 2018-01-12 DIAGNOSIS — R293 Abnormal posture: Secondary | ICD-10-CM | POA: Diagnosis not present

## 2018-01-12 DIAGNOSIS — R2689 Other abnormalities of gait and mobility: Secondary | ICD-10-CM

## 2018-01-12 NOTE — Therapy (Signed)
[facility] [address] [place], [place], [address] Phone: [phone]   Fax:  [phone]  Physical Therapy Treatment  Patient Details  Name: Chase Clark MRN: [id number] Date of Birth: 22-Jun-1965 Referring Provider (PT): Grier Mitts, [place]   Encounter Date: 01/12/2018  PT End of Session - 01/12/18 0811    Visit Number  2    Number of Visits  21    Date for PT Re-Evaluation  03/19/18    Authorization Type  [facility] as of 01/12/18. has not recieved cards as yet. will need to verify insurance when cards recieved.     PT Start Time  0804    PT Stop Time  0845    PT Time Calculation (min)  41 min    Equipment Utilized During Treatment  Gait belt    Activity Tolerance  Patient tolerated treatment well    Behavior During Therapy  WFL for tasks assessed/performed       Past Medical History:  Diagnosis Date  . Arthritis   . Asthma    as child  . Coronary artery disease    DES DIAG1 11/20/09 [facility])  . Diabetes mellitus without complication ([place])   . Diabetic osteomyelitis ([place])   . Diabetic ulcer of foot with muscle involvement without evidence of necrosis ([place])    DIABETIC ULCERATIONS ASSOCIATED WITH IRRITATION LATERAL ANKLE LEFT GREATER THAN RIGHT WITH MILD CELLULITIS   . Diverticulosis   . DKA (diabetic ketoacidoses) ([place]) 08/23/2017  . Edema, lower extremity   . Fatty liver   . GERD (gastroesophageal reflux disease)   . Gilbert's disease   . Glaucoma   . Heart disease 2011   Patient has stent for 80% blockage  . Hyperlipidemia   . Hypertension   . Neuropathy   . Seasonal allergies   . Shoulder pain   . Ulcer of foot ([place]) 08.06.2014   DIABETIC ULCERATIONS ASSOCIATED WITH IRRITATION LATERAL ANKLE LEFT GREATER THAN RIGHT WITH MILD CELLULITIS    Past Surgical History:  Procedure Laterality Date  . ABDOMINAL AORTOGRAM N/A 05/20/2016   Procedure: Abdominal Aortogram possible intervention;  Surgeon: Katha Cabal, MD;  Location: [facility];  Service: Cardiovascular;  Laterality: N/A;  . AMPUTATION Left 09/05/2017   Procedure: AMPUTATION BELOW KNEE;  Surgeon: Tania Ade, MD;  Location: [facility];  Service: Orthopedics;  Laterality: Left;  . ANTERIOR CERVICAL DECOMP/DISCECTOMY FUSION N/A 05/07/2017   Procedure: ANTERIOR CERVICAL DECOMPRESSION/DISCECTOMY Luevenia Maxin PROSTHESIS,PLATE/SCREWS CERVICAL FIVE - CERVICAL SIX;  Surgeon: Newman Pies, MD;  Location: [facility];  Service: Neurosurgery;  Laterality: N/A;  . CARDIAC CATHETERIZATION    . CATARACT EXTRACTION W/ INTRAOCULAR LENS IMPLANT    . CATARACT EXTRACTION W/PHACO Left 02/26/2017   Procedure: CATARACT EXTRACTION PHACO AND INTRAOCULAR LENS PLACEMENT (IOC);  Surgeon: Eulogio Bear, MD;  Location: [facility];  Service: Ophthalmology;  Laterality: Left;  Lot # [id number] H [place]: 00:25.3 AP%: 6.2 CDE: 1.59   . CHOLECYSTECTOMY N/A   . CORONARY ANGIOPLASTY WITH STENT PLACEMENT  2007   1st diagonal  . EPIBLEPHERON REPAIR WITH TEAR DUCT PROBING    . EYE SURGERY Right sept 29n2015   cataract extraction  . INSERTION EXPRESS TUBE SHUNT Right 09/06/2015   Procedure: INSERTION AHMED TUBE SHUNT with tutoplast allograft;  Surgeon: Eulogio Bear, MD;  Location: [facility];  Service: Ophthalmology;  Laterality: Right;  Marland Kitchen VASECTOMY      There were no vitals filed for this visit.  Subjective Assessment -  01/12/18 0807    Subjective  No new complaints. Reports some soreness in limb. To clinic with cane today. Had a fall on Wednesday. He's not sure how but he managened to put the liner over the shrinker. This caused more play in the prosthesis and his foot got caught and he fell down. Some knee soreness, getting better down.     Pertinent History  LTTA, C5-6 fusion sg, DM, neuropathy, CKD, CAD, arthritis, HTN    Limitations  Lifting;Standing;Walking;House hold activities    Patient Stated Goals  He would like to use prosthesis to increase  mobility. 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     Currently in Pain?  Yes    Pain Score  2     Pain Location  Knee    Pain Orientation  Left    Pain Descriptors / Indicators  Sore;Tender    Pain Type  Acute pain    Pain Onset  In the past 7 days    Pain Frequency  Intermittent    Aggravating Factors   recent fall    Pain Relieving Factors  rest, ice            [facility] Adult PT Treatment/Exercise - 01/12/18 0812      Transfers   Transfers  Sit to Stand;Stand to Sit    Sit to Stand  5: Supervision;With upper extremity assist;From chair/3-in-1    Stand to Sit  5: Supervision;With upper extremity assist;To chair/3-in-1      Ambulation/Gait   Ambulation/Gait  Yes    Ambulation/Gait Assistance  5: Supervision    Ambulation/Gait Assistance Details  cues for equal step length, weight shifting, incr stance on prosthesis. no balance issues noted with gait.     Ambulation Distance (Feet)  100 Feet   x1, 115 x1, 500 x1, plus around gym   Assistive device  Straight cane    Gait Pattern  Step-through pattern;Decreased stride length;Decreased stance time - left;Narrow base of support    Ambulation Surface  Level;Indoor    Stairs  Yes    Stairs Assistance  5: Supervision    Stairs Assistance Details (indicate cue type and reason)  cues on technique with cane/rail combination. performed 1 rep each with right rail/cane, then left rail/cane. cues for weight shifitng and to advance hand along rail.     Stair Management Technique  One rail Right;One rail Left;Step to pattern;Forwards;With cane    Number of Stairs  4   x2 reps   Ramp  Other (comment)   min guard assist   Ramp Details (indicate cue type and reason)  with cane/prosthesis x3 reps. cues with 1st rep on sequencing and technique. no cues needed with other 2 reps.     Curb  5: Supervision    Curb Details (indicate cue type and reason)  with cane/prosthesis, x1 on outdoor curb. pt demo'd correct technique and  sequencing.       Prosthetics   Prosthetic Care Comments   educated on use of antiperspirant on lower limb for sweat and heat rash management; baby/mineral oil on patella and upper thigh for decreased friction rubbing of liner/skin integrity; use of cut off sock for sock ply mangagement. Pt with improved fit after use of cut off sock.      Current prosthetic wear tolerance (days/week)   daily    Current prosthetic wear tolerance (#hours/day)   6-8 hours with a break between    Residual limb condition  continues with 11mm dry scab on distal scar, otherwise intact    Education Provided  Residual limb care;Correct ply sock adjustment;Proper wear schedule/adjustment;Proper weight-bearing schedule/adjustment   see prosthetic comment section as well.    Person(s) Educated  Patient    Education Method  Explanation;Demonstration    Education Method  Verbalized understanding;Returned demonstration;Verbal cues required;Needs further [place] Prosthesis  Supervision    Doffing Prosthesis  Supervision               PT Short Term Goals - 12/28/17 1658      PT SHORT TERM GOAL #1   Title  Patient verbalizes proper cleaning of prosthesis and demonstrates proper donning. (All STGs Target Date: 02/05/2018)    Time  4    Period  Weeks    Status  New    Target Date  02/05/18      PT SHORT TERM GOAL #2   Title  Patient tolerates prosthesis wear >10 hrs /day without skin issues.    Time  4    Period  Weeks    Status  New    Target Date  02/05/18      PT SHORT TERM GOAL #3   Title  Patient ambulates 500' and negotiates ramps, curbs and stairs (step-to pattern) with prosthesis & RW modified indpendent.     Time  4    Period  Weeks    Status  New    Target Date  02/05/18      PT SHORT TERM GOAL #4   Title  Patient ambulates 200' negotiating around obstacles and scanning environment with minA.     Time  4    Period  Weeks    Status  New    Target Date  02/05/18      PT  SHORT TERM GOAL #5   Title  Patient picks up objects from floor without UE support with supervision.     Time  4    Period  Weeks    Status  New    Target Date  02/05/18        PT Long Term Goals - 12/29/17 1551      PT LONG TERM GOAL #1   Title  Patient verbalizes & demonstrates proper prosthetic care to enable safe function with prosthesis. (All LTGs Target Date 03/05/2018)    Time  8    Period  Weeks    Status  New    Target Date  03/05/18      PT LONG TERM GOAL #2   Title  Patient tolerates wear of prosthesis >90% of awake hours without skin issues or limb pain to enable function during his day.     Time  8    Period  Weeks    Status  New    Target Date  03/05/18      PT LONG TERM GOAL #3   Title  Berg Balance >45/56 to indicate lower fall risk.     Time  8    Period  Weeks    Status  New    Target Date  03/05/18      PT LONG TERM GOAL #4   Title  Patient ambulates 500' outdoors including grass, ramps & curbs with cane or less and prosthesis to enable community mobility.     Time  8    Status  New    Target Date  03/05/18      PT LONG TERM  GOAL #5   Title  Patient lifts & carries 20# box with prosthesis only modified independent.     Time  8    Period  Weeks    Status  New    Target Date  03/05/18            Plan - 01/12/18 2297    Clinical Impression Statement  Today's skilled session focused on prosthetic education and mobility with cane/prosthesis. He has made good progress for being on hold for a few week due to insurance issues and is now using the cane full time. He should benefit from continued PT to progress toward unmet goals.     Rehab Potential  Good    PT Frequency  2x / week   hold until 01/12/2018 when new insurance starts per pt request   PT Duration  8 weeks   hold until 01/12/2018 when new insurance starts per pt request   PT Treatment/Interventions  ADLs/Self Care Home Management;Canalith Repostioning;DME Instruction;Gait training;Stair  training;Functional mobility training;Therapeutic activities;Therapeutic exercise;Balance training;Neuromuscular re-education;Patient/family education;Prosthetic Training;Manual techniques;Vestibular    PT Next Visit Plan  review prosthetic care, prosthetic gait with RW, progress to cane as able    Consulted and Agree with Plan of Care  Patient       Patient will benefit from skilled therapeutic intervention in order to improve the following deficits and impairments:  Abnormal gait, Decreased activity tolerance, Decreased balance, Decreased endurance, Decreased knowledge of use of DME, Decreased mobility, Decreased range of motion, Dizziness, Impaired flexibility, Postural dysfunction, Prosthetic Dependency  Visit Diagnosis: Other abnormalities of gait and mobility  Unsteadiness on feet  Muscle weakness (generalized)  Abnormal posture  History of falling     Problem List Patient Active Problem List   Diagnosis Date Noted  . Abnormality of gait 12/02/2017  . Labile blood pressure   . Labile blood glucose   . Amputation of left lower extremity below knee ([place]) 09/09/2017  . Benign prostatic hyperplasia   . Hyperlipidemia   . Stage 3 chronic kidney disease ([place])   . Neuropathic pain   . Osteomyelitis of left foot ([place])   . Coronary artery disease involving native coronary artery of native heart without angina pectoris   . Benign essential HTN   . Anemia of chronic disease   . Leukocytosis   . Pressure injury of skin 08/24/2017  . Cervical spondylosis with myelopathy and radiculopathy 05/07/2017  . ARF (acute renal failure) ([place]) 04/30/2017  . Upper respiratory infection 04/29/2017  . Fatty liver 04/04/2017  . DDD (degenerative disc disease), cervical 01/13/2017  . PAD (peripheral artery disease) ([place]) 08/07/2016  . Ulcer of ankle ([place]) 08/07/2016  . Chronic diarrhea 05/17/2016  . Hyponatremia 05/17/2016  . Anemia 01/12/2014  . Disorder of ligament of ankle 07/31/2013  .  Edema 06/28/2013  . 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 Obesity (BMI 30-39.9) 06/12/2013  . Pain in joint, shoulder region 06/12/2013  . Statin intolerance 06/10/2013  . DM (diabetes mellitus), type 2, uncontrolled w/ophthalmic complication ([place]) 98/92/1194  . Hyperlipidemia associated with type 2 diabetes mellitus ([place]) 07/17/2006  . GILBERT'S SYNDROME 07/17/2006  . Essential hypertension 07/17/2006  . Coronary atherosclerosis 07/17/2006  . OSTEOARTHRITIS 07/17/2006  . COUGH 07/17/2006    Willow Ora, PTA, [facility] [address], [place], [place] [address] [phone] 01/12/18, 3:24 PM   Name: Chase Clark MRN: [id number] Date of Birth: 06-06-1965

## 2018-01-14 ENCOUNTER — Encounter: Payer: Self-pay | Admitting: Physical Therapy

## 2018-01-14 ENCOUNTER — Ambulatory Visit: Payer: 59 | Admitting: Physical Therapy

## 2018-01-14 DIAGNOSIS — R2689 Other abnormalities of gait and mobility: Secondary | ICD-10-CM

## 2018-01-14 DIAGNOSIS — R293 Abnormal posture: Secondary | ICD-10-CM

## 2018-01-14 DIAGNOSIS — M6281 Muscle weakness (generalized): Secondary | ICD-10-CM

## 2018-01-14 DIAGNOSIS — Z9181 History of falling: Secondary | ICD-10-CM | POA: Diagnosis not present

## 2018-01-14 DIAGNOSIS — R2681 Unsteadiness on feet: Secondary | ICD-10-CM | POA: Diagnosis not present

## 2018-01-14 NOTE — Therapy (Signed)
[facility] [address] [place], [place], [address] Phone: [phone]   Fax:  [phone]  Physical Therapy Treatment  Patient Details  Name: Chase Clark MRN: [id number] Date of Birth: [birth year] Referring Provider (PT): Grier Mitts, [place]   Encounter Date: 01/14/2018  PT End of Session - 01/14/18 5681    Visit Number  3    Number of Visits  21    Date for PT Re-Evaluation  03/19/18    Authorization Type  [facility] as of 01/12/18. has not recieved cards as yet. will need to verify insurance when cards recieved.     PT Start Time  0803    PT Stop Time  [phone]    PT Time Calculation (min)  40 min    Equipment Utilized During Treatment  Gait belt    Activity Tolerance  Patient tolerated treatment well    Behavior During Therapy  WFL for tasks assessed/performed       Past Medical History:  Diagnosis Date  . Arthritis   . Asthma    as child  . Coronary artery disease    DES DIAG1 11/20/09 [facility])  . Diabetes mellitus without complication ([place])   . Diabetic osteomyelitis ([place])   . Diabetic ulcer of foot with muscle involvement without evidence of necrosis ([place])    DIABETIC ULCERATIONS ASSOCIATED WITH IRRITATION LATERAL ANKLE LEFT GREATER THAN RIGHT WITH MILD CELLULITIS   . Diverticulosis   . DKA (diabetic ketoacidoses) ([place]) 08/23/2017  . Edema, lower extremity   . Fatty liver   . GERD (gastroesophageal reflux disease)   . Gilbert's disease   . Glaucoma   . Heart disease 2011   Patient has stent for 80% blockage  . Hyperlipidemia   . Hypertension   . Neuropathy   . Seasonal allergies   . Shoulder pain   . Ulcer of foot ([facility]) 08.06.2014   DIABETIC ULCERATIONS ASSOCIATED WITH IRRITATION LATERAL ANKLE LEFT GREATER THAN RIGHT WITH MILD CELLULITIS    Past Surgical History:  Procedure Laterality Date  . ABDOMINAL AORTOGRAM N/A 05/20/2016   Procedure: Abdominal Aortogram possible intervention;  Surgeon: Katha Cabal, MD;  Location: [facility];  Service: Cardiovascular;  Laterality: N/A;  . AMPUTATION Left 09/05/2017   Procedure: AMPUTATION BELOW KNEE;  Surgeon: Tania Ade, MD;  Location: [facility];  Service: Orthopedics;  Laterality: Left;  . ANTERIOR CERVICAL DECOMP/DISCECTOMY FUSION N/A 05/07/2017   Procedure: ANTERIOR CERVICAL DECOMPRESSION/DISCECTOMY Luevenia Maxin PROSTHESIS,PLATE/SCREWS CERVICAL FIVE - CERVICAL SIX;  Surgeon: Newman Pies, MD;  Location: [facility];  Service: Neurosurgery;  Laterality: N/A;  . CARDIAC CATHETERIZATION    . CATARACT EXTRACTION W/ INTRAOCULAR LENS IMPLANT    . CATARACT EXTRACTION W/PHACO Left 02/26/2017   Procedure: CATARACT EXTRACTION PHACO AND INTRAOCULAR LENS PLACEMENT (IOC);  Surgeon: Eulogio Bear, MD;  Location: [facility];  Service: Ophthalmology;  Laterality: Left;  Lot # [id number] H [place]: 00:25.3 AP%: 6.2 CDE: 1.59   . CHOLECYSTECTOMY N/A   . CORONARY ANGIOPLASTY WITH STENT PLACEMENT  2007   1st diagonal  . EPIBLEPHERON REPAIR WITH TEAR DUCT PROBING    . EYE SURGERY Right sept 29n2015   cataract extraction  . INSERTION EXPRESS TUBE SHUNT Right 09/06/2015   Procedure: INSERTION AHMED TUBE SHUNT with tutoplast allograft;  Surgeon: Eulogio Bear, MD;  Location: [facility];  Service: Ophthalmology;  Laterality: Right;  Marland Kitchen VASECTOMY      There were no vitals filed for this visit.  Subjective Assessment -  01/14/18 0806    Subjective  No new compaints. No falls.     Pertinent History  LTTA, C5-6 fusion sg, DM, neuropathy, CKD, CAD, arthritis, HTN    Limitations  Lifting;Standing;Walking;House hold activities    Patient Stated Goals  He would like to use prosthesis to increase mobility. 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     Currently in Pain?  Yes    Pain Score  2     Pain Location  Knee    Pain Orientation  Left    Pain Descriptors / Indicators  Sore;Tender    Pain Type  Acute pain    Pain Onset  In the past 7  days    Pain Frequency  Intermittent    Aggravating Factors   pressure from prosthesis    Pain Relieving Factors  rest, ice           [facility] Adult PT Treatment/Exercise - 01/14/18 0808      Transfers   Transfers  Sit to Stand;Stand to Sit    Sit to Stand  5: Supervision;With upper extremity assist;From chair/3-in-1    Stand to Sit  5: Supervision;With upper extremity assist;To chair/3-in-1      Ambulation/Gait   Ambulation/Gait  Yes    Ambulation/Gait Assistance  5: Supervision    Ambulation/Gait Assistance Details  with cane: supervision with cues on posture, equal step length and on base of support; with no AD: min guard assist for short distances with cues on posture, step length and for equal stance time with gait.     Ambulation Distance (Feet)  220 Feet   x2, 500 x1 w/cane, 20 x2 no AD   Assistive device  Straight cane;None;Prosthesis    Gait Pattern  Step-through pattern;Decreased stride length;Decreased stance time - left;Narrow base of support    Ambulation Surface  Level;Indoor;Unlevel;Outdoor;Paved;Gravel;Grass;Other (comment)   rubber mulch   Ramp  5: Supervision    Ramp Details (indicate cue type and reason)  with cane/prosthesis on outdoor inclines/declines      High Level Balance   High Level Balance Activities  Negotitating around obstacles;Negotiating over obstacles;Side stepping;Backward walking;Marching forwards    High Level Balance Comments  with cane/prosthesis: fwd stepping over bolsters of varied heights. PTA demo'd correct seqeuncing, then pt performed x 4 laps with min guard to min assist; stepping around hoola hoops on floor- PTA demo'd technique for step length to maintain pelvic positioning. pt performed x 2 laps with min guard assist to min assist and cues on technique/step length.      Therapeutic Activites    Therapeutic Activities  Lifting    Lifting  20# crate. PTA demo'd technique for lifting. pt lifted from floor, carried for 10 feet to set on  chest high table. lifted from table, carried for 10 feet and set back on floor. repeated this for 2 reps with cues needed on stance position with prosthesis. min guard assist for balance/safety.       Neuro Re-ed    Neuro Re-ed Details   for balance/proprioception/coordination: fwd gait with head movements right<>fwd<>left, then up<>fwd<>down x 4 laps each. min guard assist for balance with veering noted, more with left<>right than up<>down.       Prosthetics   Current prosthetic wear tolerance (days/week)   daily    Current prosthetic wear tolerance (#hours/day)   6-8 hours with a break between    Residual limb condition   continues with 40mm dry scab on  distal scar, otherwise intact    Donning Prosthesis  Supervision    Doffing Prosthesis  Supervision          PT Short Term Goals - 12/28/17 1658      PT SHORT TERM GOAL #1   Title  Patient verbalizes proper cleaning of prosthesis and demonstrates proper donning. (All STGs Target Date: 02/05/2018)    Time  4    Period  Weeks    Status  New    Target Date  02/05/18      PT SHORT TERM GOAL #2   Title  Patient tolerates prosthesis wear >10 hrs /day without skin issues.    Time  4    Period  Weeks    Status  New    Target Date  02/05/18      PT SHORT TERM GOAL #3   Title  Patient ambulates 500' and negotiates ramps, curbs and stairs (step-to pattern) with prosthesis & RW modified indpendent.     Time  4    Period  Weeks    Status  New    Target Date  02/05/18      PT SHORT TERM GOAL #4   Title  Patient ambulates 200' negotiating around obstacles and scanning environment with minA.     Time  4    Period  Weeks    Status  New    Target Date  02/05/18      PT SHORT TERM GOAL #5   Title  Patient picks up objects from floor without UE support with supervision.     Time  4    Period  Weeks    Status  New    Target Date  02/05/18        PT Long Term Goals - 12/29/17 1551      PT LONG TERM GOAL #1   Title  Patient  verbalizes & demonstrates proper prosthetic care to enable safe function with prosthesis. (All LTGs Target Date 03/05/2018)    Time  8    Period  Weeks    Status  New    Target Date  03/05/18      PT LONG TERM GOAL #2   Title  Patient tolerates wear of prosthesis >90% of awake hours without skin issues or limb pain to enable function during his day.     Time  8    Period  Weeks    Status  New    Target Date  03/05/18      PT LONG TERM GOAL #3   Title  Berg Balance >45/56 to indicate lower fall risk.     Time  8    Period  Weeks    Status  New    Target Date  03/05/18      PT LONG TERM GOAL #4   Title  Patient ambulates 500' outdoors including grass, ramps & curbs with cane or less and prosthesis to enable community mobility.     Time  8    Status  New    Target Date  03/05/18      PT LONG TERM GOAL #5   Title  Patient lifts & carries 20# box with prosthesis only modified independent.     Time  8    Period  Weeks    Status  New    Target Date  03/05/18         Plan - 01/14/18 1610    Clinical Impression Statement  Today's skilled session continued to focus on prosthetic education, gait/balance with prosthesis/cane and balance with decreased UE support with no issues reported. Also progressed to gait indoors for short distances with no AD today with min guard to supervision assist needed. The pt is progressing well toward goals and should benefit from continued PT to progress toward unmet goals.     Rehab Potential  Good    PT Frequency  2x / week   hold until 01/12/2018 when new insurance starts per pt request   PT Duration  8 weeks   hold until 01/12/2018 when new insurance starts per pt request   PT Treatment/Interventions  ADLs/Self Care Home Management;Canalith Repostioning;DME Instruction;Gait training;Stair training;Functional mobility training;Therapeutic activities;Therapeutic exercise;Balance training;Neuromuscular re-education;Patient/family education;Prosthetic  Training;Manual techniques;Vestibular    PT Next Visit Plan  gait with prosthesis only indoors, continued with balance activities working toward decreased UE support.     Consulted and Agree with Plan of Care  Patient       Patient will benefit from skilled therapeutic intervention in order to improve the following deficits and impairments:  Abnormal gait, Decreased activity tolerance, Decreased balance, Decreased endurance, Decreased knowledge of use of DME, Decreased mobility, Decreased range of motion, Dizziness, Impaired flexibility, Postural dysfunction, Prosthetic Dependency  Visit Diagnosis: Other abnormalities of gait and mobility  Unsteadiness on feet  Muscle weakness (generalized)  Abnormal posture     Problem List Patient Active Problem List   Diagnosis Date Noted  . Abnormality of gait 12/02/2017  . Labile blood pressure   . Labile blood glucose   . Amputation of left lower extremity below knee ([place]) 09/09/2017  . Benign prostatic hyperplasia   . Hyperlipidemia   . Stage 3 chronic kidney disease ([place])   . Neuropathic pain   . Osteomyelitis of left foot ([place])   . Coronary artery disease involving native coronary artery of native heart without angina pectoris   . Benign essential HTN   . Anemia of chronic disease   . Leukocytosis   . Pressure injury of skin 08/24/2017  . Cervical spondylosis with myelopathy and radiculopathy 05/07/2017  . ARF (acute renal failure) ([place]) 04/30/2017  . Upper respiratory infection 04/29/2017  . Fatty liver 04/04/2017  . DDD (degenerative disc disease), cervical 01/13/2017  . PAD (peripheral artery disease) ([place]) 08/07/2016  . Ulcer of ankle ([place]) 08/07/2016  . Chronic diarrhea 05/17/2016  . Hyponatremia 05/17/2016  . Anemia 01/12/2014  . Disorder of ligament of ankle 07/31/2013  . Edema 06/28/2013  . 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 Obesity (BMI 30-39.9) 06/12/2013  . Pain in joint, shoulder region  06/12/2013  . Statin intolerance 06/10/2013  . DM (diabetes mellitus), type 2, uncontrolled w/ophthalmic complication ([place]) 56/43/3295  . Hyperlipidemia associated with type 2 diabetes mellitus ([place]) 07/17/2006  . GILBERT'S SYNDROME 07/17/2006  . Essential hypertension 07/17/2006  . Coronary atherosclerosis 07/17/2006  . OSTEOARTHRITIS 07/17/2006  . COUGH 07/17/2006    Willow Ora, PTA, [facility] [address], [place], [place] [address] [phone] 01/14/18, 3:27 PM   Name: Chase Clark MRN: [id number] Date of Birth: 17-Mar-1966

## 2018-01-16 DIAGNOSIS — E1310 Other specified diabetes mellitus with ketoacidosis without coma: Secondary | ICD-10-CM | POA: Diagnosis not present

## 2018-01-16 DIAGNOSIS — Z4781 Encounter for orthopedic aftercare following surgical amputation: Secondary | ICD-10-CM | POA: Diagnosis not present

## 2018-01-16 DIAGNOSIS — Z89512 Acquired absence of left leg below knee: Secondary | ICD-10-CM | POA: Diagnosis not present

## 2018-01-18 ENCOUNTER — Ambulatory Visit: Payer: 59 | Admitting: Physical Therapy

## 2018-01-18 ENCOUNTER — Ambulatory Visit: Payer: BLUE CROSS/BLUE SHIELD | Admitting: Family Medicine

## 2018-01-18 ENCOUNTER — Encounter: Payer: Self-pay | Admitting: Physical Therapy

## 2018-01-18 DIAGNOSIS — R2681 Unsteadiness on feet: Secondary | ICD-10-CM

## 2018-01-18 DIAGNOSIS — M6281 Muscle weakness (generalized): Secondary | ICD-10-CM

## 2018-01-18 DIAGNOSIS — Z9181 History of falling: Secondary | ICD-10-CM | POA: Diagnosis not present

## 2018-01-18 DIAGNOSIS — R2689 Other abnormalities of gait and mobility: Secondary | ICD-10-CM

## 2018-01-18 DIAGNOSIS — R293 Abnormal posture: Secondary | ICD-10-CM | POA: Diagnosis not present

## 2018-01-18 NOTE — Therapy (Signed)
[facility] [address] [place], [place], [address] Phone: [phone]   Fax:  [phone]  Physical Therapy Treatment  Patient Details  Name: Chase Clark MRN: [id number] Date of Birth: 04-03-1966 Referring Provider (PT): Grier Mitts, [place]   Encounter Date: 01/18/2018  PT End of Session - 01/18/18 2141    Visit Number  4    Number of Visits  21    Date for PT Re-Evaluation  03/19/18    Authorization Type  [facility] as of 01/12/18. has not recieved cards as yet. will need to verify insurance when cards recieved.     PT Start Time  0848    PT Stop Time  0929    PT Time Calculation (min)  41 min    Equipment Utilized During Treatment  Gait belt    Activity Tolerance  Patient limited by pain    Behavior During Therapy  WFL for tasks assessed/performed       Past Medical History:  Diagnosis Date  . Arthritis   . Asthma    as child  . Coronary artery disease    DES DIAG1 11/20/09 [facility])  . Diabetes mellitus without complication ([place])   . Diabetic osteomyelitis ([place])   . Diabetic ulcer of foot with muscle involvement without evidence of necrosis ([place])    DIABETIC ULCERATIONS ASSOCIATED WITH IRRITATION LATERAL ANKLE LEFT GREATER THAN RIGHT WITH MILD CELLULITIS   . Diverticulosis   . DKA (diabetic ketoacidoses) ([place]) 08/23/2017  . Edema, lower extremity   . Fatty liver   . GERD (gastroesophageal reflux disease)   . Gilbert's disease   . Glaucoma   . Heart disease 2011   Patient has stent for 80% blockage  . Hyperlipidemia   . Hypertension   . Neuropathy   . Seasonal allergies   . Shoulder pain   . Ulcer of foot ([place]) 08.06.2014   DIABETIC ULCERATIONS ASSOCIATED WITH IRRITATION LATERAL ANKLE LEFT GREATER THAN RIGHT WITH MILD CELLULITIS    Past Surgical History:  Procedure Laterality Date  . ABDOMINAL AORTOGRAM N/A 05/20/2016   Procedure: Abdominal Aortogram possible intervention;  Surgeon: Katha Cabal,  MD;  Location: [facility];  Service: Cardiovascular;  Laterality: N/A;  . AMPUTATION Left 09/05/2017   Procedure: AMPUTATION BELOW KNEE;  Surgeon: Tania Ade, MD;  Location: [facility];  Service: Orthopedics;  Laterality: Left;  . ANTERIOR CERVICAL DECOMP/DISCECTOMY FUSION N/A 05/07/2017   Procedure: ANTERIOR CERVICAL DECOMPRESSION/DISCECTOMY Luevenia Maxin PROSTHESIS,PLATE/SCREWS CERVICAL FIVE - CERVICAL SIX;  Surgeon: Newman Pies, MD;  Location: [facility];  Service: Neurosurgery;  Laterality: N/A;  . CARDIAC CATHETERIZATION    . CATARACT EXTRACTION W/ INTRAOCULAR LENS IMPLANT    . CATARACT EXTRACTION W/PHACO Left 02/26/2017   Procedure: CATARACT EXTRACTION PHACO AND INTRAOCULAR LENS PLACEMENT (IOC);  Surgeon: Eulogio Bear, MD;  Location: [facility];  Service: Ophthalmology;  Laterality: Left;  Lot # [id number] H [place]: 00:25.3 AP%: 6.2 CDE: 1.59   . CHOLECYSTECTOMY N/A   . CORONARY ANGIOPLASTY WITH STENT PLACEMENT  2007   1st diagonal  . EPIBLEPHERON REPAIR WITH TEAR DUCT PROBING    . EYE SURGERY Right sept 29n2015   cataract extraction  . INSERTION EXPRESS TUBE SHUNT Right 09/06/2015   Procedure: INSERTION AHMED TUBE SHUNT with tutoplast allograft;  Surgeon: Eulogio Bear, MD;  Location: [facility];  Service: Ophthalmology;  Laterality: Right;  Marland Kitchen VASECTOMY      There were no vitals filed for this visit.  Subjective Assessment -  01/18/18 0850    Subjective  Going to see [employer] this morning.  Knee is still a little sore, not sure if it is from the fall or from pressure of socket.  Wore the leg >8 hours yesterday and was up on his leg a lot.  Also notices some skin irritation at distal thigh from liner rubbing - has not been using baby oil yet.  Is only wearing 1 1/2 sock and has not tried adding another half 1 ply sock.    Pertinent History  LTTA, C5-6 fusion sg, DM, neuropathy, CKD, CAD, arthritis, HTN    Limitations  Lifting;Standing;Walking;House hold activities     Patient Stated Goals  He would like to use prosthesis to increase mobility. 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     Currently in Pain?  Yes    Pain Score  4     Pain Onset  In the past 7 days          Prosthetics Assessment - 01/18/18 0908      Prosthetics   Prosthetic Care Dependent with  Correct ply sock adjustment    Prosthetic Care Comments   Pt to have [employer] assess alignment today due to position of pylon causing pt's knee to remain in flexion in stance placing increased pressure on L anterior knee.  Prosthesis also noted to be in external rotation.  Donned one extra shortened 1 ply sock and continued to educate pt on sock adjustment throughout the day as residual limb decreases in size.  Also continued to reinforce use of baby oil at top of liner to decrease friction on skin    Donning prosthesis   Independent    Doffing prosthesis   Modified independent (Device/Increase time)    Current prosthetic wear tolerance (days/week)   daily    Current prosthetic wear tolerance (#hours/day)   >8 hours                  [facility] Adult PT Treatment/Exercise - 01/18/18 0908      Ambulation/Gait   Ambulation/Gait  Yes    Ambulation/Gait Assistance  4: Min guard    Ambulation/Gait Assistance Details  without cane while picking up 10lb crate with wide stance and carrying it across compliant surfaces, around obstacles, and up/down ramp.  Pt unable to carry crate and negotiate curb due to balance and knee pain.  Performed curb without holding crate.  Pt reported pain in L knee 5-6/10 after performing ramp and curb.      Ambulation Distance (Feet)  230 Feet    Assistive device  None    Gait Pattern  Step-through pattern;Decreased stride length;Decreased weight shift to left;Left flexed knee in stance;Lateral hip instability;Wide base of support    Ambulation Surface  Level;Indoor    Stairs  Yes    Stairs Assistance  4: Min assist;5: Supervision    Stairs Assistance  Details (indicate cue type and reason)  supervision for step to sequencing with cane and no rails; also performed one set of stairs without any UE support on cane or rails    Stair Management Technique  No rails;With cane;Step to pattern;Forwards    Number of Stairs  12    Height of Stairs  6    Ramp  4: Min assist    Ramp Details (indicate cue type and reason)  without cane while carrying 10lb crate    Curb  4: Min assist    Curb Details (  indicate cue type and reason)  without cane; verbal cues for technique               PT Short Term Goals - 12/28/17 1658      PT SHORT TERM GOAL #1   Title  Patient verbalizes proper cleaning of prosthesis and demonstrates proper donning. (All STGs Target Date: 02/05/2018)    Time  4    Period  Weeks    Status  New    Target Date  02/05/18      PT SHORT TERM GOAL #2   Title  Patient tolerates prosthesis wear >10 hrs /day without skin issues.    Time  4    Period  Weeks    Status  New    Target Date  02/05/18      PT SHORT TERM GOAL #3   Title  Patient ambulates 500' and negotiates ramps, curbs and stairs (step-to pattern) with prosthesis & RW modified indpendent.     Time  4    Period  Weeks    Status  New    Target Date  02/05/18      PT SHORT TERM GOAL #4   Title  Patient ambulates 200' negotiating around obstacles and scanning environment with minA.     Time  4    Period  Weeks    Status  New    Target Date  02/05/18      PT SHORT TERM GOAL #5   Title  Patient picks up objects from floor without UE support with supervision.     Time  4    Period  Weeks    Status  New    Target Date  02/05/18        PT Long Term Goals - 12/29/17 1551      PT LONG TERM GOAL #1   Title  Patient verbalizes & demonstrates proper prosthetic care to enable safe function with prosthesis. (All LTGs Target Date 03/05/2018)    Time  8    Period  Weeks    Status  New    Target Date  03/05/18      PT LONG TERM GOAL #2   Title  Patient  tolerates wear of prosthesis >90% of awake hours without skin issues or limb pain to enable function during his day.     Time  8    Period  Weeks    Status  New    Target Date  03/05/18      PT LONG TERM GOAL #3   Title  Berg Balance >45/56 to indicate lower fall risk.     Time  8    Period  Weeks    Status  New    Target Date  03/05/18      PT LONG TERM GOAL #4   Title  Patient ambulates 500' outdoors including grass, ramps & curbs with cane or less and prosthesis to enable community mobility.     Time  8    Status  New    Target Date  03/05/18      PT LONG TERM GOAL #5   Title  Patient lifts & carries 20# box with prosthesis only modified independent.     Time  8    Period  Weeks    Status  New    Target Date  03/05/18            Plan - 01/18/18 2142    Clinical Impression  Statement  Treatment session today focused on reinforcement of previous instruction for care of residual limb, sock ply adjustment and importance of having prosthetist assess and adjust fit if prosthesis.  Continued gait training without cane with focus on gait with more dynamic challenges while carrying weighted object in bilat UE.  Unable to negotiate curb or stairs while carrying object but was able to negotiate without UE support.  Pt slightly limited by knee pain.  Will continue to assess and progress towards LTG.    Rehab Potential  Good    PT Frequency  2x / week   hold until 01/12/2018 when new insurance starts per pt request   PT Duration  8 weeks   hold until 01/12/2018 when new insurance starts per pt request   PT Treatment/Interventions  ADLs/Self Care Home Management;Canalith Repostioning;DME Instruction;Gait training;Stair training;Functional mobility training;Therapeutic activities;Therapeutic exercise;Balance training;Neuromuscular re-education;Patient/family education;Prosthetic Training;Manual techniques;Vestibular    PT Next Visit Plan  did [employer] adjust pylon to decrease pressure on  knee?  gait training carrying weighted objects.  Gait outside.    Consulted and Agree with Plan of Care  Patient       Patient will benefit from skilled therapeutic intervention in order to improve the following deficits and impairments:  Abnormal gait, Decreased activity tolerance, Decreased balance, Decreased endurance, Decreased knowledge of use of DME, Decreased mobility, Decreased range of motion, Dizziness, Impaired flexibility, Postural dysfunction, Prosthetic Dependency  Visit Diagnosis: Other abnormalities of gait and mobility  Unsteadiness on feet  Muscle weakness (generalized)     Problem List Patient Active Problem List   Diagnosis Date Noted  . Abnormality of gait 12/02/2017  . Labile blood pressure   . Labile blood glucose   . Amputation of left lower extremity below knee ([place]) 09/09/2017  . Benign prostatic hyperplasia   . Hyperlipidemia   . Stage 3 chronic kidney disease ([place])   . Neuropathic pain   . Osteomyelitis of left foot ([place])   . Coronary artery disease involving native coronary artery of native heart without angina pectoris   . Benign essential HTN   . Anemia of chronic disease   . Leukocytosis   . Pressure injury of skin 08/24/2017  . Cervical spondylosis with myelopathy and radiculopathy 05/07/2017  . ARF (acute renal failure) ([place]) 04/30/2017  . Upper respiratory infection 04/29/2017  . Fatty liver 04/04/2017  . DDD (degenerative disc disease), cervical 01/13/2017  . PAD (peripheral artery disease) ([place]) 08/07/2016  . Ulcer of ankle ([place]) 08/07/2016  . Chronic diarrhea 05/17/2016  . Hyponatremia 05/17/2016  . Anemia 01/12/2014  . Disorder of ligament of ankle 07/31/2013  . Edema 06/28/2013  . 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 Obesity (BMI 30-39.9) 06/12/2013  . Pain in joint, shoulder region 06/12/2013  . Statin intolerance 06/10/2013  . DM (diabetes mellitus), type 2, uncontrolled w/ophthalmic complication ([place])  99/24/2683  . Hyperlipidemia associated with type 2 diabetes mellitus ([place]) 07/17/2006  . GILBERT'S SYNDROME 07/17/2006  . Essential hypertension 07/17/2006  . Coronary atherosclerosis 07/17/2006  . OSTEOARTHRITIS 07/17/2006  . COUGH 07/17/2006    Rico Junker, PT, DPT 01/18/18    9:47 PM    [facility] [address] [place], [place], [address] Phone: [phone]   Fax:  [phone]  Name: Chase Clark MRN: [id number] Date of Birth: Oct 22, 1965

## 2018-01-19 ENCOUNTER — Encounter: Payer: Self-pay | Admitting: Physical Therapy

## 2018-01-19 ENCOUNTER — Ambulatory Visit: Payer: 59 | Admitting: Physical Therapy

## 2018-01-19 DIAGNOSIS — R2681 Unsteadiness on feet: Secondary | ICD-10-CM

## 2018-01-19 DIAGNOSIS — Z9181 History of falling: Secondary | ICD-10-CM | POA: Diagnosis not present

## 2018-01-19 DIAGNOSIS — M6281 Muscle weakness (generalized): Secondary | ICD-10-CM | POA: Diagnosis not present

## 2018-01-19 DIAGNOSIS — R2689 Other abnormalities of gait and mobility: Secondary | ICD-10-CM | POA: Diagnosis not present

## 2018-01-19 DIAGNOSIS — R293 Abnormal posture: Secondary | ICD-10-CM | POA: Diagnosis not present

## 2018-01-20 NOTE — Therapy (Signed)
[facility] [address] [place], [place], [address] Phone: [phone]   Fax:  [phone]  Physical Therapy Treatment  Patient Details  Name: Chase Clark MRN: [id number] Date of Birth: 12/16/1965 Referring Provider (PT): Chase Clark, [place]   Encounter Date: 01/19/2018  PT End of Session - 01/19/18 3267    Visit Number  5    Number of Visits  21    Date for PT Re-Evaluation  03/19/18    Authorization Type  [facility] as of 01/12/18. has not recieved cards as yet. will need to verify insurance when cards recieved.     PT Start Time  0806    PT Stop Time  0845    PT Time Calculation (min)  39 min    Equipment Utilized During Treatment  Gait belt    Activity Tolerance  Patient tolerated treatment well;No increased pain;Patient limited by fatigue    Behavior During Therapy  The Chase Clark for tasks assessed/performed       Past Medical History:  Diagnosis Date  . Arthritis   . Asthma    as child  . Coronary artery disease    DES DIAG1 11/20/09 Chase Clark)  . Diabetes mellitus without complication ([place])   . Diabetic osteomyelitis ([place])   . Diabetic ulcer of foot with muscle involvement without evidence of necrosis ([place])    DIABETIC ULCERATIONS ASSOCIATED WITH IRRITATION LATERAL ANKLE LEFT GREATER THAN RIGHT WITH MILD CELLULITIS   . Diverticulosis   . DKA (diabetic ketoacidoses) ([place]) 08/23/2017  . Edema, lower extremity   . Fatty liver   . GERD (gastroesophageal reflux disease)   . Gilbert's disease   . Glaucoma   . Heart disease 2011   Patient has stent for 80% blockage  . Hyperlipidemia   . Hypertension   . Neuropathy   . Seasonal allergies   . Shoulder pain   . Ulcer of foot ([place]) 08.06.2014   DIABETIC ULCERATIONS ASSOCIATED WITH IRRITATION LATERAL ANKLE LEFT GREATER THAN RIGHT WITH MILD CELLULITIS    Past Surgical History:  Procedure Laterality Date  . ABDOMINAL AORTOGRAM N/A 05/20/2016   Procedure: Abdominal  Aortogram possible intervention;  Surgeon: Chase Cabal, MD;  Location: [facility];  Service: Cardiovascular;  Laterality: N/A;  . AMPUTATION Left 09/05/2017   Procedure: AMPUTATION BELOW KNEE;  Surgeon: Chase Ade, MD;  Location: [facility];  Service: Orthopedics;  Laterality: Left;  . ANTERIOR CERVICAL DECOMP/DISCECTOMY FUSION N/A 05/07/2017   Procedure: ANTERIOR CERVICAL DECOMPRESSION/DISCECTOMY Chase Clark PROSTHESIS,PLATE/SCREWS CERVICAL FIVE - CERVICAL SIX;  Surgeon: Chase Pies, MD;  Location: [facility];  Service: Neurosurgery;  Laterality: N/A;  . CARDIAC CATHETERIZATION    . CATARACT EXTRACTION W/ INTRAOCULAR LENS IMPLANT    . CATARACT EXTRACTION W/PHACO Left 02/26/2017   Procedure: CATARACT EXTRACTION PHACO AND INTRAOCULAR LENS PLACEMENT (IOC);  Surgeon: Chase Bear, MD;  Location: [facility];  Service: Ophthalmology;  Laterality: Left;  Lot # [id number] H [place]: 00:25.3 AP%: 6.2 CDE: 1.59   . CHOLECYSTECTOMY N/A   . CORONARY ANGIOPLASTY WITH STENT PLACEMENT  2007   1st diagonal  . EPIBLEPHERON REPAIR WITH TEAR DUCT PROBING    . EYE SURGERY Right sept 29n2015   cataract extraction  . INSERTION EXPRESS TUBE SHUNT Right 09/06/2015   Procedure: INSERTION AHMED TUBE SHUNT with tutoplast allograft;  Surgeon: Chase Bear, MD;  Location: [facility];  Service: Ophthalmology;  Laterality: Right;  Marland Kitchen VASECTOMY      There were no vitals filed for  this visit.  Subjective Assessment - 01/19/18 0807    Subjective  Saw Chase Clark who made "a lot" of adjustments to the prothesis. Sore from all the lifting yesterday.     Pertinent History  LTTA, C5-6 fusion sg, DM, neuropathy, CKD, CAD, arthritis, HTN    Limitations  Lifting;Standing;Walking;House hold activities    Patient Stated Goals  He would like to use prosthesis to increase mobility. 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     Currently in Pain?  No/denies    Pain Score  0-No pain          [facility]  Adult PT Treatment/Exercise - 01/19/18 0809      Transfers   Transfers  Sit to Stand;Stand to Sit    Sit to Stand  5: Supervision;With upper extremity assist;From chair/3-in-1    Stand to Sit  5: Supervision;With upper extremity assist;To chair/3-in-1      Ambulation/Gait   Ambulation/Gait  Yes    Ambulation/Gait Assistance  5: Supervision    Ambulation/Gait Assistance Details  cues for prosthetic foot placement as he tend to abduct it out, cues for equal step length and equal stance time     Ambulation Distance (Feet)  120 Feet   x2, plus around gym with activities   Assistive device  Straight cane;None    Gait Pattern  Step-through pattern;Decreased stride length;Decreased weight shift to left;Left flexed knee in stance;Lateral hip instability;Wide base of support    Ambulation Surface  Level;Indoor    Stairs  Yes    Stairs Assistance  5: Supervision    Stairs Assistance Details (indicate cue type and reason)  cues on prosthetic foot placement for decending reciprocally     Stair Management Technique  Two rails;Alternating pattern;Forwards    Number of Stairs  4   x4 reps   Height of Stairs  6    Ramp  Other (comment)   min guard assist   Ramp Details (indicate cue type and reason)  with prosthesis only, cues on posture and step length x 2 reps.    Curb  Other (comment)   min guard assist   Curb Details (indicate cue type and reason)  with prosthesis only on indoor 6 inch curb with cues for step through after negotiation to assist with balance       High Level Balance   High Level Balance Activities  Negotitating around obstacles;Negotiating over obstacles    High Level Balance Comments  with prosthesis only: fwd stepping over bolsters of varied heights, then weaving a figure 8 around 2 hoola hoops on the floor. cues on sequenicng with stepping over bolsters and for pelvic positioning with figure 8's. pt performed 3 laps down/back with min guard assist for balance.        Prosthetics   Prosthetic Care Comments   pt to go to all awake hours with no break, removing to dry limb/liner as needed.     Current prosthetic wear tolerance (days/week)   daily    Current prosthetic wear tolerance (#hours/day)   most awake hours with 30 minute break mid-day.     Residual limb condition   scab off. removed with tweezers a loose piece of scab/? suture. otherwise intact. baby oil applied to patella and upper thigh. Pt has not started using antiperspirant  as yet due to his has too may perfumes/dyes in it.     Education Provided  Residual limb care;Proper wear schedule/adjustment;Proper weight-bearing schedule/adjustment  Person(s) Educated  Patient    Education Method  Explanation;Demonstration;Verbal cues    Education Method  Verbalized understanding;Returned demonstration;Verbal cues required    Donning Prosthesis  Supervision    Doffing Prosthesis  Supervision          Balance Exercises - 01/19/18 0844      Balance Exercises: Standing   Balance Beam  standing across blue foam beam: alternating fwd stepping to floor/back onto beam, then alternating bwd stepping to floor/back onto beam. no UE support to occasional touch to bars for balance, min guard assist with cues on step length/height to assist with balance.     Sidestepping  Foam/compliant support;Upper extremity support;Limitations      Balance Exercises: Standing   Sidestepping Limitations  on floor x 3 laps each way, progressing to on blue foam beam for 3 laps each way. min guard assist with cues on posture and step height/length.           PT Short Term Goals - 12/28/17 1658      PT SHORT TERM GOAL #1   Title  Patient verbalizes proper cleaning of prosthesis and demonstrates proper donning. (All STGs Target Date: 02/05/2018)    Time  4    Period  Weeks    Status  New    Target Date  02/05/18      PT SHORT TERM GOAL #2   Title  Patient tolerates prosthesis wear >10 hrs /day without skin issues.     Time  4    Period  Weeks    Status  New    Target Date  02/05/18      PT SHORT TERM GOAL #3   Title  Patient ambulates 500' and negotiates ramps, curbs and stairs (step-to pattern) with prosthesis & RW modified indpendent.     Time  4    Period  Weeks    Status  New    Target Date  02/05/18      PT SHORT TERM GOAL #4   Title  Patient ambulates 200' negotiating around obstacles and scanning environment with minA.     Time  4    Period  Weeks    Status  New    Target Date  02/05/18      PT SHORT TERM GOAL #5   Title  Patient picks up objects from floor without UE support with supervision.     Time  4    Period  Weeks    Status  New    Target Date  02/05/18        PT Long Term Goals - 12/29/17 1551      PT LONG TERM GOAL #1   Title  Patient verbalizes & demonstrates proper prosthetic care to enable safe function with prosthesis. (All LTGs Target Date 03/05/2018)    Time  8    Period  Weeks    Status  New    Target Date  03/05/18      PT LONG TERM GOAL #2   Title  Patient tolerates wear of prosthesis >90% of awake hours without skin issues or limb pain to enable function during his day.     Time  8    Period  Weeks    Status  New    Target Date  03/05/18      PT LONG TERM GOAL #3   Title  Berg Balance >45/56 to indicate lower fall risk.     Time  8  Period  Weeks    Status  New    Target Date  03/05/18      PT LONG TERM GOAL #4   Title  Patient ambulates 500' outdoors including grass, ramps & curbs with cane or less and prosthesis to enable community mobility.     Time  8    Status  New    Target Date  03/05/18      PT LONG TERM GOAL #5   Title  Patient lifts & carries 20# box with prosthesis only modified independent.     Time  8    Period  Weeks    Status  New    Target Date  03/05/18            Plan - 01/19/18 0808    Clinical Impression Statement  Today's skilled session focused on gait/barriers with prosthesis only and balance reactions with  decreased UE support. No issues reported in session. The pt is progressing well toward goals and should benefit from continued PT to progress toward unmet goals.     Rehab Potential  Good    PT Frequency  2x / week   hold until 01/12/2018 when new insurance starts per pt request   PT Duration  8 weeks   hold until 01/12/2018 when new insurance starts per pt request   PT Treatment/Interventions  ADLs/Self Care Home Management;Canalith Repostioning;DME Instruction;Gait training;Stair training;Functional mobility training;Therapeutic activities;Therapeutic exercise;Balance training;Neuromuscular re-education;Patient/family education;Prosthetic Training;Manual techniques;Vestibular    PT Next Visit Plan  continue to work on gait/barriers with prosthesis only, begin work simulation activities with pushing/pulling, climbing ladders, continue with lifting and carrying weighted objects, continue with balance with decr UE support.     Consulted and Agree with Plan of Care  Patient       Patient will benefit from skilled therapeutic intervention in order to improve the following deficits and impairments:  Abnormal gait, Decreased activity tolerance, Decreased balance, Decreased endurance, Decreased knowledge of use of DME, Decreased mobility, Decreased range of motion, Dizziness, Impaired flexibility, Postural dysfunction, Prosthetic Dependency  Visit Diagnosis: Other abnormalities of gait and mobility  Unsteadiness on feet  Muscle weakness (generalized)  Abnormal posture     Problem List Patient Active Problem List   Diagnosis Date Noted  . Abnormality of gait 12/02/2017  . Labile blood pressure   . Labile blood glucose   . Amputation of left lower extremity below knee ([place]) 09/09/2017  . Benign prostatic hyperplasia   . Hyperlipidemia   . Stage 3 chronic kidney disease ([place])   . Neuropathic pain   . Osteomyelitis of left foot ([place])   . Coronary artery disease involving native coronary  artery of native heart without angina pectoris   . Benign essential HTN   . Anemia of chronic disease   . Leukocytosis   . Pressure injury of skin 08/24/2017  . Cervical spondylosis with myelopathy and radiculopathy 05/07/2017  . ARF (acute renal failure) ([place]) 04/30/2017  . Upper respiratory infection 04/29/2017  . Fatty liver 04/04/2017  . DDD (degenerative disc disease), cervical 01/13/2017  . PAD (peripheral artery disease) ([place]) 08/07/2016  . Ulcer of ankle ([place]) 08/07/2016  . Chronic diarrhea 05/17/2016  . Hyponatremia 05/17/2016  . Anemia 01/12/2014  . Disorder of ligament of ankle 07/31/2013  . Edema 06/28/2013  . 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 Obesity (BMI 30-39.9) 06/12/2013  . Pain in joint, shoulder region 06/12/2013  . Statin intolerance 06/10/2013  . DM (diabetes mellitus), type  2, uncontrolled w/ophthalmic complication ([place]) 50/15/8682  . Hyperlipidemia associated with type 2 diabetes mellitus ([place]) 07/17/2006  . GILBERT'S SYNDROME 07/17/2006  . Essential hypertension 07/17/2006  . Coronary atherosclerosis 07/17/2006  . OSTEOARTHRITIS 07/17/2006  . COUGH 07/17/2006    Willow Ora, PTA, [facility] [address] [place], [place] [address] [phone] 01/20/18, 10:40 AM   Name: Chase Clark MRN: [id number] Date of Birth: 1965/11/25

## 2018-01-25 ENCOUNTER — Encounter: Payer: Self-pay | Admitting: Rehabilitation

## 2018-01-25 ENCOUNTER — Ambulatory Visit: Payer: 59 | Admitting: Rehabilitation

## 2018-01-25 DIAGNOSIS — R293 Abnormal posture: Secondary | ICD-10-CM

## 2018-01-25 DIAGNOSIS — R2689 Other abnormalities of gait and mobility: Secondary | ICD-10-CM

## 2018-01-25 DIAGNOSIS — M6281 Muscle weakness (generalized): Secondary | ICD-10-CM | POA: Diagnosis not present

## 2018-01-25 DIAGNOSIS — R2681 Unsteadiness on feet: Secondary | ICD-10-CM | POA: Diagnosis not present

## 2018-01-25 DIAGNOSIS — Z9181 History of falling: Secondary | ICD-10-CM | POA: Diagnosis not present

## 2018-01-25 NOTE — Therapy (Signed)
[facility] [address] [place], [place], [address] Phone: [phone]   Fax:  [phone]  Physical Therapy Treatment  Patient Details  Name: Chase Clark MRN: [id number] Date of Birth: 29-Dec-1965 Referring Provider (PT): Grier Mitts, [place]   Encounter Date: 01/25/2018  PT End of Session - 01/25/18 1320    Visit Number  6    Number of Visits  21    Date for PT Re-Evaluation  03/19/18    Authorization Type  [facility] as of 01/12/18. has not recieved cards as yet. will need to verify insurance when cards recieved.     PT Start Time  1317    PT Stop Time  1401    PT Time Calculation (min)  44 min    Equipment Utilized During Treatment  Gait belt    Activity Tolerance  Patient tolerated treatment well;No increased pain;Patient limited by fatigue    Behavior During Therapy  [facility] for tasks assessed/performed       Past Medical History:  Diagnosis Date  . Arthritis   . Asthma    as child  . Coronary artery disease    DES DIAG1 11/20/09 [facility])  . Diabetes mellitus without complication ([place])   . Diabetic osteomyelitis ([place])   . Diabetic ulcer of foot with muscle involvement without evidence of necrosis ([place])    DIABETIC ULCERATIONS ASSOCIATED WITH IRRITATION LATERAL ANKLE LEFT GREATER THAN RIGHT WITH MILD CELLULITIS   . Diverticulosis   . DKA (diabetic ketoacidoses) ([place]) 08/23/2017  . Edema, lower extremity   . Fatty liver   . GERD (gastroesophageal reflux disease)   . Gilbert's disease   . Glaucoma   . Heart disease 2011   Patient has stent for 80% blockage  . Hyperlipidemia   . Hypertension   . Neuropathy   . Seasonal allergies   . Shoulder pain   . Ulcer of foot ([place]) 08.06.2014   DIABETIC ULCERATIONS ASSOCIATED WITH IRRITATION LATERAL ANKLE LEFT GREATER THAN RIGHT WITH MILD CELLULITIS    Past Surgical History:  Procedure Laterality Date  . ABDOMINAL AORTOGRAM N/A 05/20/2016   Procedure: Abdominal  Aortogram possible intervention;  Surgeon: Katha Cabal, MD;  Location: [facility];  Service: Cardiovascular;  Laterality: N/A;  . AMPUTATION Left 09/05/2017   Procedure: AMPUTATION BELOW KNEE;  Surgeon: Tania Ade, MD;  Location: [facility];  Service: Orthopedics;  Laterality: Left;  . ANTERIOR CERVICAL DECOMP/DISCECTOMY FUSION N/A 05/07/2017   Procedure: ANTERIOR CERVICAL DECOMPRESSION/DISCECTOMY Luevenia Maxin PROSTHESIS,PLATE/SCREWS CERVICAL FIVE - CERVICAL SIX;  Surgeon: Newman Pies, MD;  Location: [facility];  Service: Neurosurgery;  Laterality: N/A;  . CARDIAC CATHETERIZATION    . CATARACT EXTRACTION W/ INTRAOCULAR LENS IMPLANT    . CATARACT EXTRACTION W/PHACO Left 02/26/2017   Procedure: CATARACT EXTRACTION PHACO AND INTRAOCULAR LENS PLACEMENT (IOC);  Surgeon: Eulogio Bear, MD;  Location: [facility];  Service: Ophthalmology;  Laterality: Left;  Lot # [id number] H [place]: 00:25.3 AP%: 6.2 CDE: 1.59   . CHOLECYSTECTOMY N/A   . CORONARY ANGIOPLASTY WITH STENT PLACEMENT  2007   1st diagonal  . EPIBLEPHERON REPAIR WITH TEAR DUCT PROBING    . EYE SURGERY Right sept 29n2015   cataract extraction  . INSERTION EXPRESS TUBE SHUNT Right 09/06/2015   Procedure: INSERTION AHMED TUBE SHUNT with tutoplast allograft;  Surgeon: Eulogio Bear, MD;  Location: [facility];  Service: Ophthalmology;  Laterality: Right;  Marland Kitchen VASECTOMY      There were no vitals filed for  this visit.  Subjective Assessment - 01/25/18 1318    Subjective  Reports just returned from [place] for trip, just tired from driving.     Pertinent History  LTTA, C5-6 fusion sg, DM, neuropathy, CKD, CAD, arthritis, HTN    Limitations  Lifting;Standing;Walking;House hold activities    Patient Stated Goals  He would like to use prosthesis to increase mobility. 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     Currently in Pain?  Yes    Pain Score  2     Pain Location  Leg    Pain Orientation  Left    Pain  Descriptors / Indicators  Sore    Pain Type  Acute pain    Pain Onset  Yesterday    Pain Frequency  Intermittent    Aggravating Factors   swelling from sitting and poor diet the last few days    Pain Relieving Factors  reduce socks                       [facility] Adult PT Treatment/Exercise - 01/25/18 1357      Ambulation/Gait   Ambulation/Gait  Yes    Ambulation/Gait Assistance  5: Supervision;6: Modified independent (Device/Increase time)    Ambulation/Gait Assistance Details  Pt able to ambulate over level/unlevel paved and grassy outdoor surfaces.     Ambulation Distance (Feet)  700 Feet    Assistive device  None    Gait Pattern  Step-through pattern;Decreased stride length;Decreased weight shift to left;Left flexed knee in stance;Lateral hip instability;Wide base of support    Ambulation Surface  Unlevel;Outdoor;Paved;Grass    Stairs  Yes    Stairs Assistance  5: Supervision    Stairs Assistance Details (indicate cue type and reason)  Cues for safe technique with reciprocal pattern and placement of L prosthesis.     Stair Management Technique  No rails;Alternating pattern;Forwards    Number of Stairs  12    Height of Stairs  6      Standardized Balance Assessment   Standardized Balance Assessment  Berg Balance Test      Berg Balance Test   Sit to Stand  Able to stand without using hands and stabilize independently    Standing Unsupported  Able to stand safely 2 minutes    Sitting with Back Unsupported but Feet Supported on Floor or Stool  Able to sit safely and securely 2 minutes    Stand to Sit  Sits safely with minimal use of hands    Transfers  Able to transfer safely, minor use of hands    Standing Unsupported with Eyes Closed  Able to stand 10 seconds safely    Standing Ubsupported with Feet Together  Able to place feet together independently and stand 1 minute safely    From Standing, Reach Forward with Outstretched Arm  Can reach confidently >25 cm (10")     From Standing Position, Pick up Object from Floor  Able to pick up shoe safely and easily    From Standing Position, Turn to Look Behind Over each Shoulder  Looks behind from both sides and weight shifts well    Turn 360 Degrees  Able to turn 360 degrees safely one side only in 4 seconds or less      Therapeutic Activites    Therapeutic Activities  Lifting;Work Web designer  Pt able to lift and carry 20lb crate during session.  Min cues  for safe technique and placement of prosthesis.     Work Economist  Had pt retrieve ladder from [employer] and unfold, climb (following demonstration from PT) x 2 reps and then return ladder to closet at S level.  Had pt transition from standing to half kneel>tall kneel>back to stand       Neuro Re-ed    Neuro Re-ed Details   cone tapping with RLE x 10 reps with intermittent min/guard needed for balance.   Stepping to plastic stepping stones x 8 reps (4 sets) to simulate walking in attic as on job.  Pt needing intermittent min/guard to min A but was able to self correct most mild LOB.             PT Education - 01/25/18 1459    Education Details  work simulation, [place]    Person(s) Educated  Patient    Methods  Explanation;Demonstration    Comprehension  Verbalized understanding;Returned demonstration       PT Short Term Goals - 12/28/17 1658      PT SHORT TERM GOAL #1   Title  Patient verbalizes proper cleaning of prosthesis and demonstrates proper donning. (All STGs Target Date: 02/05/2018)    Time  4    Period  Weeks    Status  New    Target Date  02/05/18      PT SHORT TERM GOAL #2   Title  Patient tolerates prosthesis wear >10 hrs /day without skin issues.    Time  4    Period  Weeks    Status  New    Target Date  02/05/18      PT SHORT TERM GOAL #3   Title  Patient ambulates 500' and negotiates ramps, curbs and stairs (step-to pattern) with prosthesis & RW modified indpendent.     Time  4    Period  Weeks    Status   New    Target Date  02/05/18      PT SHORT TERM GOAL #4   Title  Patient ambulates 200' negotiating around obstacles and scanning environment with minA.     Time  4    Period  Weeks    Status  New    Target Date  02/05/18      PT SHORT TERM GOAL #5   Title  Patient picks up objects from floor without UE support with supervision.     Time  4    Period  Weeks    Status  New    Target Date  02/05/18        PT Long Term Goals - 01/25/18 1346      PT LONG TERM GOAL #1   Title  Patient verbalizes & demonstrates proper prosthetic care to enable safe function with prosthesis. (All LTGs Target Date 03/05/2018)    Time  8    Period  Weeks    Status  Achieved      PT LONG TERM GOAL #2   Title  Patient tolerates wear of prosthesis >90% of awake hours without skin issues or limb pain to enable function during his day.     Time  8    Period  Weeks    Status  Achieved      PT LONG TERM GOAL #3   Title  Berg Balance >45/56 to indicate lower fall risk.     Time  8    Period  Weeks    Status  New  PT LONG TERM GOAL #4   Title  Patient ambulates 500' outdoors including grass, ramps & curbs with cane or less and prosthesis to enable community mobility.     Time  8    Status  New      PT LONG TERM GOAL #5   Title  Patient lifts & carries 20# box with prosthesis only modified independent.     Time  8    Period  Weeks    Status  New            Plan - 01/25/18 1500    Clinical Impression Statement  Skilled session focused on high level gait and balance along with work simulation.  Pt has met 2/5 LTGs and will likely meet BERG goal (did not have time to finish).  Will update goals as needed as PT feels he would continue to benefit from skilled therapy to address remaining deficits.      Rehab Potential  Good    PT Frequency  2x / week   hold until 01/12/2018 when new insurance starts per pt request   PT Duration  8 weeks   hold until 01/12/2018 when new insurance starts per  pt request   PT Treatment/Interventions  ADLs/Self Care Home Management;Canalith Repostioning;DME Instruction;Gait training;Stair training;Functional mobility training;Therapeutic activities;Therapeutic exercise;Balance training;Neuromuscular re-education;Patient/family education;Prosthetic Training;Manual techniques;Vestibular    PT Next Visit Plan  finish BERG, update goals-send to [facility], continue to work on gait/barriers with prosthesis only, begin work simulation activities with pushing/pulling, climbing ladders, continue with lifting and carrying weighted objects, continue with balance with decr UE support.     Consulted and Agree with Plan of Care  Patient       Patient will benefit from skilled therapeutic intervention in order to improve the following deficits and impairments:  Abnormal gait, Decreased activity tolerance, Decreased balance, Decreased endurance, Decreased knowledge of use of DME, Decreased mobility, Decreased range of motion, Dizziness, Impaired flexibility, Postural dysfunction, Prosthetic Dependency  Visit Diagnosis: Other abnormalities of gait and mobility  Unsteadiness on feet  Muscle weakness (generalized)  Abnormal posture     Problem List Patient Active Problem List   Diagnosis Date Noted  . Abnormality of gait 12/02/2017  . Labile blood pressure   . Labile blood glucose   . Amputation of left lower extremity below knee ([place]) 09/09/2017  . Benign prostatic hyperplasia   . Hyperlipidemia   . Stage 3 chronic kidney disease ([place])   . Neuropathic pain   . Osteomyelitis of left foot ([place])   . Coronary artery disease involving native coronary artery of native heart without angina pectoris   . Benign essential HTN   . Anemia of chronic disease   . Leukocytosis   . Pressure injury of skin 08/24/2017  . Cervical spondylosis with myelopathy and radiculopathy 05/07/2017  . ARF (acute renal failure) ([place]) 04/30/2017  . Upper respiratory infection 04/29/2017   . Fatty liver 04/04/2017  . DDD (degenerative disc disease), cervical 01/13/2017  . PAD (peripheral artery disease) ([place]) 08/07/2016  . Ulcer of ankle ([place]) 08/07/2016  . Chronic diarrhea 05/17/2016  . Hyponatremia 05/17/2016  . Anemia 01/12/2014  . Disorder of ligament of ankle 07/31/2013  . Edema 06/28/2013  . 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 Obesity (BMI 30-39.9) 06/12/2013  . Pain in joint, shoulder region 06/12/2013  . Statin intolerance 06/10/2013  . DM (diabetes mellitus), type 2, uncontrolled w/ophthalmic complication ([place]) 49/70/2637  . Hyperlipidemia associated with type 2 diabetes mellitus (  [place]) 07/17/2006  . GILBERT'S SYNDROME 07/17/2006  . Essential hypertension 07/17/2006  . Coronary atherosclerosis 07/17/2006  . OSTEOARTHRITIS 07/17/2006  . COUGH 07/17/2006    Cameron Sprang, PT, MPT [facility] [address] [place], [place], [address] Phone: [phone]   Fax:  [phone] 01/25/18, 3:04 PM  Name: OSRIC KLOPF MRN: [id number] Date of Birth: 11/16/1965

## 2018-01-28 ENCOUNTER — Ambulatory Visit: Payer: 59 | Admitting: Rehabilitation

## 2018-01-28 ENCOUNTER — Encounter: Payer: Self-pay | Admitting: Rehabilitation

## 2018-01-28 DIAGNOSIS — M6281 Muscle weakness (generalized): Secondary | ICD-10-CM

## 2018-01-28 DIAGNOSIS — R2681 Unsteadiness on feet: Secondary | ICD-10-CM | POA: Diagnosis not present

## 2018-01-28 DIAGNOSIS — Z9181 History of falling: Secondary | ICD-10-CM | POA: Diagnosis not present

## 2018-01-28 DIAGNOSIS — R2689 Other abnormalities of gait and mobility: Secondary | ICD-10-CM

## 2018-01-28 DIAGNOSIS — R293 Abnormal posture: Secondary | ICD-10-CM | POA: Diagnosis not present

## 2018-01-28 NOTE — Therapy (Signed)
[facility] [place] [address] [place], [place], [address] Phone: [phone]   Fax:  [phone]  Physical Therapy Treatment  Patient Details  Name: Chase Clark MRN: [id number] Date of Birth: 02/29/1966 Referring Provider (PT): Grier Mitts, [place]   Encounter Date: 01/28/2018  PT End of Session - 01/28/18 1541    Visit Number  7    Number of Visits  21    Date for PT Re-Evaluation  03/19/18    Authorization Type  [facility] as of 01/12/18. has not recieved cards as yet. will need to verify insurance when cards recieved.     PT Start Time  1405    PT Stop Time  1447    PT Time Calculation (min)  42 min    Equipment Utilized During Treatment  Gait belt    Activity Tolerance  Patient tolerated treatment well;No increased pain;Patient limited by fatigue    Behavior During Therapy  [facility] for tasks assessed/performed       Past Medical History:  Diagnosis Date  . Arthritis   . Asthma    as child  . Coronary artery disease    DES DIAG1 11/20/09 [facility])  . Diabetes mellitus without complication ([place])   . Diabetic osteomyelitis ([place])   . Diabetic ulcer of foot with muscle involvement without evidence of necrosis ([place])    DIABETIC ULCERATIONS ASSOCIATED WITH IRRITATION LATERAL ANKLE LEFT GREATER THAN RIGHT WITH MILD CELLULITIS   . Diverticulosis   . DKA (diabetic ketoacidoses) ([place]) 08/23/2017  . Edema, lower extremity   . Fatty liver   . GERD (gastroesophageal reflux disease)   . Gilbert's disease   . Glaucoma   . Heart disease 2011   Patient has stent for 80% blockage  . Hyperlipidemia   . Hypertension   . Neuropathy   . Seasonal allergies   . Shoulder pain   . Ulcer of foot ([place]) 08.06.2014   DIABETIC ULCERATIONS ASSOCIATED WITH IRRITATION LATERAL ANKLE LEFT GREATER THAN RIGHT WITH MILD CELLULITIS    Past Surgical History:  Procedure Laterality Date  . ABDOMINAL AORTOGRAM N/A 05/20/2016   Procedure: Abdominal  Aortogram possible intervention;  Surgeon: Katha Cabal, MD;  Location: [facility];  Service: Cardiovascular;  Laterality: N/A;  . AMPUTATION Left 09/05/2017   Procedure: AMPUTATION BELOW KNEE;  Surgeon: Tania Ade, MD;  Location: [facility];  Service: Orthopedics;  Laterality: Left;  . ANTERIOR CERVICAL DECOMP/DISCECTOMY FUSION N/A 05/07/2017   Procedure: ANTERIOR CERVICAL DECOMPRESSION/DISCECTOMY Luevenia Maxin PROSTHESIS,PLATE/SCREWS CERVICAL FIVE - CERVICAL SIX;  Surgeon: Newman Pies, MD;  Location: [facility];  Service: Neurosurgery;  Laterality: N/A;  . CARDIAC CATHETERIZATION    . CATARACT EXTRACTION W/ INTRAOCULAR LENS IMPLANT    . CATARACT EXTRACTION W/PHACO Left 02/26/2017   Procedure: CATARACT EXTRACTION PHACO AND INTRAOCULAR LENS PLACEMENT (IOC);  Surgeon: Eulogio Bear, MD;  Location: [facility];  Service: Ophthalmology;  Laterality: Left;  Lot # [id number] H [place]: 00:25.3 AP%: 6.2 CDE: 1.59   . CHOLECYSTECTOMY N/A   . CORONARY ANGIOPLASTY WITH STENT PLACEMENT  2007   1st diagonal  . EPIBLEPHERON REPAIR WITH TEAR DUCT PROBING    . EYE SURGERY Right sept 29n2015   cataract extraction  . INSERTION EXPRESS TUBE SHUNT Right 09/06/2015   Procedure: INSERTION AHMED TUBE SHUNT with tutoplast allograft;  Surgeon: Eulogio Bear, MD;  Location: [facility];  Service: Ophthalmology;  Laterality: Right;  Marland Kitchen VASECTOMY      There were no vitals filed for  this visit.  Subjective Assessment - 01/28/18 1413    Subjective  Pt reports soreness in L limb today.  Has extra socks if needed.     Pertinent History  LTTA, C5-6 fusion sg, DM, neuropathy, CKD, CAD, arthritis, HTN    Limitations  Lifting;Standing;Walking;House hold activities    Patient Stated Goals  He would like to use prosthesis to increase mobility. 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     Currently in Pain?  Yes    Pain Score  3     Pain Location  Leg    Pain Orientation  Left    Pain  Descriptors / Indicators  Sore    Pain Type  Acute pain    Pain Onset  Yesterday    Pain Frequency  Intermittent    Aggravating Factors   walking a lot yesterday.     Pain Relieving Factors  adding socks         [facility] PT Assessment - 01/28/18 1440      Functional Gait  Assessment   Gait assessed   Yes    Gait Level Surface  Walks 20 ft in less than 5.5 sec, no assistive devices, good speed, no evidence for imbalance, normal gait pattern, deviates no more than 6 in outside of the 12 in walkway width.   5.47 secs    Change in Gait Speed  Able to smoothly change walking speed without loss of balance or gait deviation. Deviate no more than 6 in outside of the 12 in walkway width.    Gait with Horizontal Head Turns  Performs head turns with moderate changes in gait velocity, slows down, deviates 10-15 in outside 12 in walkway width but recovers, can continue to walk.    Gait with Vertical Head Turns  Performs head turns with no change in gait. Deviates no more than 6 in outside 12 in walkway width.    Gait and Pivot Turn  Pivot turns safely within 3 sec and stops quickly with no loss of balance.    Step Over Obstacle  Is able to step over one shoe box (4.5 in total height) without changing gait speed. No evidence of imbalance.    Gait with Narrow Base of Support  Ambulates less than 4 steps heel to toe or cannot perform without assistance.    Gait with Eyes Closed  Walks 20 ft, uses assistive device, slower speed, mild gait deviations, deviates 6-10 in outside 12 in walkway width. Ambulates 20 ft in less than 9 sec but greater than 7 sec.    Ambulating Backwards  Walks 20 ft, uses assistive device, slower speed, mild gait deviations, deviates 6-10 in outside 12 in walkway width.    Steps  Alternating feet, must use rail.    Total Score  21    FGA comment:  19-24 = medium risk fall                   [facility] Adult PT Treatment/Exercise - 01/28/18 1432      Ambulation/Gait    Ambulation/Gait  Yes    Ambulation/Gait Assistance  5: Supervision    Ambulation/Gait Assistance Details  Note during gait he reports he feels as though his L knee is going to buckle/is throwing him forward.  Upon further assessment from PT/PTA note lateral lean at pylon and he tends to adduct LE.  Recommend he add another sock as he was only wearing 2, 1ply socks.  added another 1ply sock with reports of increased stability and pt cued to have slightly wider BOS and this seemed to help and pt reports increased "stability" with gait.      Ambulation Distance (Feet)  300 Feet   plus several bouts of approx 60' when assessing prosthesis.    Assistive device  None    Gait Pattern  Step-through pattern;Decreased stride length;Decreased weight shift to left;Left flexed knee in stance;Lateral hip instability;Wide base of support    Ambulation Surface  Level;Indoor    Stairs  Yes    Stairs Assistance  5: Supervision    Stairs Assistance Details (indicate cue type and reason)  Pt able to ascend without UE support, however needs single UE support for safe descent with improved prosthetic placement on edge of step.     Stair Management Technique  No rails;One rail Right;Alternating pattern;Forwards    Number of Stairs  8    Height of Stairs  6      Standardized Balance Assessment   Standardized Balance Assessment  Berg Balance Test      Berg Balance Test   Sit to Stand  Able to stand without using hands and stabilize independently    Standing Unsupported  Able to stand safely 2 minutes    Sitting with Back Unsupported but Feet Supported on Floor or Stool  Able to sit safely and securely 2 minutes    Stand to Sit  Sits safely with minimal use of hands    Transfers  Able to transfer safely, minor use of hands    Standing Unsupported with Eyes Closed  Able to stand 10 seconds safely    Standing Ubsupported with Feet Together  Able to place feet together independently and stand 1 minute safely    From  Standing, Reach Forward with Outstretched Arm  Can reach confidently >25 cm (10")    From Standing Position, Pick up Object from Floor  Able to pick up shoe safely and easily    From Standing Position, Turn to Look Behind Over each Shoulder  Looks behind from both sides and weight shifts well    Turn 360 Degrees  Able to turn 360 degrees safely one side only in 4 seconds or less    Standing Unsupported, Alternately Place Feet on Step/Stool  Able to complete 4 steps without aid or supervision    Standing Unsupported, One Foot in Front  Able to plae foot ahead of the other independently and hold 30 seconds    Standing on One Leg  Able to lift leg independently and hold equal to or more than 3 seconds    Total Score  50      Self-Care   Self-Care  Other Self-Care Comments    Other Self-Care Comments   Provided education on possible padding that can be added to tibial region in prosthesis and calf pad that could also be added if needed.  Recommended he contact [employer] and set up appt to address any further issues with socket and pylon.  Pt verbalized understanding.  Also continue to educate on the need to bring multi ply socks with him as he has tended to fluctuate often during the day and is having the limb shrink a lot in the afternoon/evening and this is likely to be causing some of his limb pain.  Pt verbalized understanding. Also educated on performing alternating LE tapping to inside of bottom cabinet at home to work on SLS/balance, along with semi tandem stance and  tandem stance at counter top for support.  Plan to go over HEP at next session to update as needed.               PT Education - 01/28/18 1540    Education Details  balance test results    Person(s) Educated  Patient    Methods  Explanation    Comprehension  Verbalized understanding       PT Short Term Goals - 01/28/18 1542      PT SHORT TERM GOAL #1   Title  Patient verbalizes proper cleaning of prosthesis and  demonstrates proper donning. (All STGs Target Date: 02/05/2018)    Time  4    Period  Weeks    Status  Achieved      PT SHORT TERM GOAL #2   Title  Patient tolerates prosthesis wear >10 hrs /day without skin issues.    Time  4    Period  Weeks    Status  Achieved      PT SHORT TERM GOAL #3   Title  Patient ambulates 500' and negotiates ramps, curbs and stairs (step-to pattern) with prosthesis & RW modified indpendent.     Baseline  met without RW    Time  4    Period  Weeks    Status  Achieved      PT SHORT TERM GOAL #4   Title  Patient ambulates 200' negotiating around obstacles and scanning environment with minA.     Baseline  S level     Time  4    Period  Weeks    Status  Achieved      PT SHORT TERM GOAL #5   Title  Patient picks up objects from floor without UE support with supervision.     Time  4    Period  Weeks    Status  Achieved        PT Long Term Goals - 01/28/18 1543      PT LONG TERM GOAL #1   Title  Patient verbalizes & demonstrates proper prosthetic care to enable safe function with prosthesis. (All LTGs Target Date 03/05/2018)    Time  8    Period  Weeks    Status  Achieved      PT LONG TERM GOAL #2   Title  Patient tolerates wear of prosthesis >90% of awake hours without skin issues or limb pain to enable function during his day.     Time  8    Period  Weeks    Status  Achieved      PT LONG TERM GOAL #3   Title  Berg Balance >45/56 to indicate lower fall risk.     Baseline  50/56 on 01/28/18    Time  8    Period  Weeks    Status  Achieved      PT LONG TERM GOAL #4   Title  Patient ambulates 500' outdoors including grass, ramps & curbs with cane or less and prosthesis to enable community mobility.     Time  8    Status  New      PT LONG TERM GOAL #5   Title  Patient lifts & carries 20# box with prosthesis only modified independent.     Time  8    Period  Weeks    Status  New      Additional Long Term Goals   Additional Long Term  Goals  Yes  PT LONG TERM GOAL #6   Title  Pt will improve FGA to >/=24/30 in order to indicate decreased fall risk.     Time  8    Period  Weeks    Status  New            Plan - 01/28/18 1541    Clinical Impression Statement  Skilled session finished assessing BERG balance test with score of 50/56, therefore performed more dynamic assessment of balance with FGA.  Note score of 21/30 indicative of moderate fall risk.  Will update LTGs to reflect progress.     Rehab Potential  Good    PT Frequency  2x / week   hold until 01/12/2018 when new insurance starts per pt request   PT Duration  8 weeks   hold until 01/12/2018 when new insurance starts per pt request   PT Treatment/Interventions  ADLs/Self Care Home Management;Canalith Repostioning;DME Instruction;Gait training;Stair training;Functional mobility training;Therapeutic activities;Therapeutic exercise;Balance training;Neuromuscular re-education;Patient/family education;Prosthetic Training;Manual techniques;Vestibular    PT Next Visit Plan  Go over HEP/update, (keep a check on LTGs, Emily added FGA goal but feel he won't need full 8 weeks) continue to work on gait/barriers with prosthesis only, begin work simulation activities with pushing/pulling, climbing ladders, continue with lifting and carrying weighted objects, continue with balance with decr UE support.     Consulted and Agree with Plan of Care  Patient       Patient will benefit from skilled therapeutic intervention in order to improve the following deficits and impairments:  Abnormal gait, Decreased activity tolerance, Decreased balance, Decreased endurance, Decreased knowledge of use of DME, Decreased mobility, Decreased range of motion, Dizziness, Impaired flexibility, Postural dysfunction, Prosthetic Dependency  Visit Diagnosis: Other abnormalities of gait and mobility  Unsteadiness on feet  Muscle weakness (generalized)  Abnormal posture     Problem  List Patient Active Problem List   Diagnosis Date Noted  . Abnormality of gait 12/02/2017  . Labile blood pressure   . Labile blood glucose   . Amputation of left lower extremity below knee ([place]) 09/09/2017  . Benign prostatic hyperplasia   . Hyperlipidemia   . Stage 3 chronic kidney disease ([place])   . Neuropathic pain   . Osteomyelitis of left foot ([place])   . Coronary artery disease involving native coronary artery of native heart without angina pectoris   . Benign essential HTN   . Anemia of chronic disease   . Leukocytosis   . Pressure injury of skin 08/24/2017  . Cervical spondylosis with myelopathy and radiculopathy 05/07/2017  . ARF (acute renal failure) ([place]) 04/30/2017  . Upper respiratory infection 04/29/2017  . Fatty liver 04/04/2017  . DDD (degenerative disc disease), cervical 01/13/2017  . PAD (peripheral artery disease) ([place]) 08/07/2016  . Ulcer of ankle ([place]) 08/07/2016  . Chronic diarrhea 05/17/2016  . Hyponatremia 05/17/2016  . Anemia 01/12/2014  . Disorder of ligament of ankle 07/31/2013  . Edema 06/28/2013  . 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 Obesity (BMI 30-39.9) 06/12/2013  . Pain in joint, shoulder region 06/12/2013  . Statin intolerance 06/10/2013  . DM (diabetes mellitus), type 2, uncontrolled w/ophthalmic complication ([place]) 30/12/2328  . Hyperlipidemia associated with type 2 diabetes mellitus ([place]) 07/17/2006  . GILBERT'S SYNDROME 07/17/2006  . Essential hypertension 07/17/2006  . Coronary atherosclerosis 07/17/2006  . OSTEOARTHRITIS 07/17/2006  . COUGH 07/17/2006    Cameron Sprang, PT, MPT [facility] [address] [place], [place], [address] Phone: [phone]  Fax:  [phone] 01/28/18, 3:45 PM  Name: Chase Clark MRN: [id number] Date of Birth: [birth year]

## 2018-02-01 ENCOUNTER — Encounter: Payer: Self-pay | Admitting: Endocrinology

## 2018-02-01 ENCOUNTER — Ambulatory Visit: Payer: 59 | Admitting: Rehabilitation

## 2018-02-01 ENCOUNTER — Ambulatory Visit: Payer: BLUE CROSS/BLUE SHIELD | Admitting: Endocrinology

## 2018-02-01 ENCOUNTER — Encounter: Payer: Self-pay | Admitting: Rehabilitation

## 2018-02-01 ENCOUNTER — Encounter: Payer: BLUE CROSS/BLUE SHIELD | Admitting: Nutrition

## 2018-02-01 VITALS — BP 142/78 | HR 72 | Ht 70.0 in | Wt 221.0 lb

## 2018-02-01 DIAGNOSIS — R293 Abnormal posture: Secondary | ICD-10-CM | POA: Diagnosis not present

## 2018-02-01 DIAGNOSIS — R2681 Unsteadiness on feet: Secondary | ICD-10-CM

## 2018-02-01 DIAGNOSIS — R2689 Other abnormalities of gait and mobility: Secondary | ICD-10-CM | POA: Diagnosis not present

## 2018-02-01 DIAGNOSIS — M6281 Muscle weakness (generalized): Secondary | ICD-10-CM

## 2018-02-01 DIAGNOSIS — E1165 Type 2 diabetes mellitus with hyperglycemia: Secondary | ICD-10-CM | POA: Diagnosis not present

## 2018-02-01 DIAGNOSIS — E1139 Type 2 diabetes mellitus with other diabetic ophthalmic complication: Secondary | ICD-10-CM

## 2018-02-01 DIAGNOSIS — IMO0002 Reserved for concepts with insufficient information to code with codable children: Secondary | ICD-10-CM

## 2018-02-01 DIAGNOSIS — Z9181 History of falling: Secondary | ICD-10-CM | POA: Diagnosis not present

## 2018-02-01 LAB — POCT GLYCOSYLATED HEMOGLOBIN (HGB A1C): Hemoglobin A1C: 10.8 % — AB (ref 4.0–5.6)

## 2018-02-01 MED ORDER — INSULIN GLARGINE 100 UNITS/ML SOLOSTAR PEN: 120.0000 [IU] | PEN_INJECTOR | SUBCUTANEOUS | 11 refills | Status: DC

## 2018-02-01 MED ORDER — INSULIN LISPRO 100 UNIT/ML (KWIKPEN): 25.0000 [IU] | PEN_INJECTOR | Freq: Three times a day (TID) | SUBCUTANEOUS | 3 refills | Status: DC

## 2018-02-01 NOTE — Therapy (Signed)
[facility] [address] [place], [place], [address] Phone: [phone]   Fax:  [phone]  Physical Therapy Treatment  Patient Details  Name: Chase Clark MRN: [id number] Date of Birth: [birth year] Referring Provider (PT): Grier Mitts, [place]   Encounter Date: 02/01/2018  PT End of Session - 02/01/18 1410    Visit Number  8    Number of Visits  21    Date for PT Re-Evaluation  03/19/18    Authorization Type  [facility] as of 01/12/18. has not recieved cards as yet. will need to verify insurance when cards recieved.     PT Start Time  1403    PT Stop Time  1445    PT Time Calculation (min)  42 min    Equipment Utilized During Treatment  Gait belt    Activity Tolerance  Patient tolerated treatment well;No increased pain;Patient limited by fatigue    Behavior During Therapy  [facility] for tasks assessed/performed       Past Medical History:  Diagnosis Date  . Arthritis   . Asthma    as child  . Coronary artery disease    DES DIAG1 11/20/09 [facility])  . Diabetes mellitus without complication ([place])   . Diabetic osteomyelitis ([place])   . Diabetic ulcer of foot with muscle involvement without evidence of necrosis ([place])    DIABETIC ULCERATIONS ASSOCIATED WITH IRRITATION LATERAL ANKLE LEFT GREATER THAN RIGHT WITH MILD CELLULITIS   . Diverticulosis   . DKA (diabetic ketoacidoses) ([place]) 08/23/2017  . Edema, lower extremity   . Fatty liver   . GERD (gastroesophageal reflux disease)   . Gilbert's disease   . Glaucoma   . Heart disease 2011   Patient has stent for 80% blockage  . Hyperlipidemia   . Hypertension   . Neuropathy   . Seasonal allergies   . Shoulder pain   . Ulcer of foot ([place]) 08.06.2014   DIABETIC ULCERATIONS ASSOCIATED WITH IRRITATION LATERAL ANKLE LEFT GREATER THAN RIGHT WITH MILD CELLULITIS    Past Surgical History:  Procedure Laterality Date  . ABDOMINAL AORTOGRAM N/A 05/20/2016   Procedure: Abdominal  Aortogram possible intervention;  Surgeon: Katha Cabal, MD;  Location: [place] [facility];  Service: Cardiovascular;  Laterality: N/A;  . AMPUTATION Left 09/05/2017   Procedure: AMPUTATION BELOW KNEE;  Surgeon: Tania Ade, MD;  Location: [place];  Service: Orthopedics;  Laterality: Left;  . ANTERIOR CERVICAL DECOMP/DISCECTOMY FUSION N/A 05/07/2017   Procedure: ANTERIOR CERVICAL DECOMPRESSION/DISCECTOMY Luevenia Maxin PROSTHESIS,PLATE/SCREWS CERVICAL FIVE - CERVICAL SIX;  Surgeon: Newman Pies, MD;  Location: [place];  Service: Neurosurgery;  Laterality: N/A;  . CARDIAC CATHETERIZATION    . CATARACT EXTRACTION W/ INTRAOCULAR LENS IMPLANT    . CATARACT EXTRACTION W/PHACO Left 02/26/2017   Procedure: CATARACT EXTRACTION PHACO AND INTRAOCULAR LENS PLACEMENT (IOC);  Surgeon: Eulogio Bear, MD;  Location: [facility];  Service: Ophthalmology;  Laterality: Left;  Lot # [id number] H [place]: 00:25.3 AP%: 6.2 CDE: 1.59   . CHOLECYSTECTOMY N/A   . CORONARY ANGIOPLASTY WITH STENT PLACEMENT  2007   1st diagonal  . EPIBLEPHERON REPAIR WITH TEAR DUCT PROBING    . EYE SURGERY Right sept 29n2015   cataract extraction  . INSERTION EXPRESS TUBE SHUNT Right 09/06/2015   Procedure: INSERTION AHMED TUBE SHUNT with tutoplast allograft;  Surgeon: Eulogio Bear, MD;  Location: [facility];  Service: Ophthalmology;  Laterality: Right;  Marland Kitchen VASECTOMY      There were no vitals filed for  this visit.  Subjective Assessment - 02/01/18 1408    Subjective  Pt reports doing well.  Went to [employer] over the weekend, didn't walk on the beach.     Pertinent History  LTTA, C5-6 fusion sg, DM, neuropathy, CKD, CAD, arthritis, HTN    Limitations  Lifting;Standing;Walking;House hold activities    Patient Stated Goals  He would like to use prosthesis to increase mobility. 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     Currently in Pain?  No/denies                       [facility] Adult PT  Treatment/Exercise - 02/01/18 1405      Ambulation/Gait   Ambulation/Gait  Yes    Ambulation/Gait Assistance  5: Supervision    Ambulation/Gait Assistance Details  Min cues for improved L hip activation during stance due to noted lateral trunk lean to compensate, esp with fatigue.     Ambulation Distance (Feet)  300 Feet    Assistive device  None    Gait Pattern  Step-through pattern;Decreased stride length;Decreased weight shift to left;Left flexed knee in stance;Lateral hip instability;Wide base of support    Ambulation Surface  Level;Indoor    Stairs  Yes    Stairs Assistance  5: Supervision    Stairs Assistance Details (indicate cue type and reason)  Pt continues to demonstrate improved technique with L prosthesis placement, however still needs min cues for improved safety.     Stair Management Technique  No rails;One rail Right;Alternating pattern;Forwards    Number of Stairs  8    Height of Stairs  6      High Level Balance   High Level Balance Comments  High level balance in // bars initially standing on BOSU (inverted standing on black top) however pt unable to maintain balance without heavy UE support therefore transitioned to large rocker board.  This continued to be extremely difficult for pt therefore transitioned again to standing on foam airex with feet apart.  Pt able to perform x 2 sets of 20 secs without UE support>feet apart EO with head turns side/side x 10 reps.  Continued working on balance and stepping strategy standing on foam airex stepping forward alternating LEs.  He had marked difficulty when advancing RLE forward/backwards due to L hip instability and decreased balance.  Pt reporting that he feels residual limb adducting during task, therefore assessed prothetic positioning.  PT feels as though he needed to add socks.  He only had another 1 ply sock with him, therefore had him don this.  Note some improvement with stability with balance tasks following this.  Alternating  LE cone tapping x 10 reps with light facilitation for improved L lateral weight shift.  Pt tolerated much better than previous balance exercises.  Braiding x 2 sets of 25' without UE support.  Pt with good use of step strategy during task with one mild LOB.         Exercises   Exercises  Knee/Hip      Knee/Hip Exercises: Standing   Other Standing Knee Exercises  Side stepping squats x 25' x 2 reps      Knee/Hip Exercises: Seated   Sit to Sand  1 set;10 reps;without UE support      Knee/Hip Exercises: Sidelying   Hip ABduction  Strengthening;Left;1 set;10 reps    Hip ABduction Limitations  Cues for decreased ER along with maintaining hip in  forward position.  Verbally added to HEP      Knee/Hip Exercises: Prone   Hip Extension  Strengthening;Left;1 set;10 reps    Hip Extension Limitations  Prone with L knee in flexed position.        Prosthetics   Prosthetic Care Comments   Pt wearing all awake hours, removing as needed to dry limb/liner     Current prosthetic wear tolerance (days/week)   daily    Current prosthetic wear tolerance (#hours/day)   all awake hours    Edema  Pt reports some edema this morning due to being in car coming home from beach yesterday.     Education Provided  Correct ply sock adjustment    Person(s) Educated  Patient    Education Method  Explanation    Education Method  Verbalized understanding    Donning Prosthesis  Supervision    Doffing Prosthesis  Modified independent (device/increased time)             PT Education - 02/01/18 1651    Education Details  hip weakness and how it relates to gait and balance deficits    Person(s) Educated  Patient    Methods  Explanation    Comprehension  Verbalized understanding       PT Short Term Goals - 01/28/18 1542      PT SHORT TERM GOAL #1   Title  Patient verbalizes proper cleaning of prosthesis and demonstrates proper donning. (All STGs Target Date: 02/05/2018)    Time  4    Period  Weeks    Status   Achieved      PT SHORT TERM GOAL #2   Title  Patient tolerates prosthesis wear >10 hrs /day without skin issues.    Time  4    Period  Weeks    Status  Achieved      PT SHORT TERM GOAL #3   Title  Patient ambulates 500' and negotiates ramps, curbs and stairs (step-to pattern) with prosthesis & RW modified indpendent.     Baseline  met without RW    Time  4    Period  Weeks    Status  Achieved      PT SHORT TERM GOAL #4   Title  Patient ambulates 200' negotiating around obstacles and scanning environment with minA.     Baseline  S level     Time  4    Period  Weeks    Status  Achieved      PT SHORT TERM GOAL #5   Title  Patient picks up objects from floor without UE support with supervision.     Time  4    Period  Weeks    Status  Achieved        PT Long Term Goals - 01/28/18 1543      PT LONG TERM GOAL #1   Title  Patient verbalizes & demonstrates proper prosthetic care to enable safe function with prosthesis. (All LTGs Target Date 03/05/2018)    Time  8    Period  Weeks    Status  Achieved      PT LONG TERM GOAL #2   Title  Patient tolerates wear of prosthesis >90% of awake hours without skin issues or limb pain to enable function during his day.     Time  8    Period  Weeks    Status  Achieved      PT LONG TERM GOAL #3  Title  Berg Balance >45/56 to indicate lower fall risk.     Baseline  50/56 on 01/28/18    Time  8    Period  Weeks    Status  Achieved      PT LONG TERM GOAL #4   Title  Patient ambulates 500' outdoors including grass, ramps & curbs with cane or less and prosthesis to enable community mobility.     Time  8    Status  New      PT LONG TERM GOAL #5   Title  Patient lifts & carries 20# box with prosthesis only modified independent.     Time  8    Period  Weeks    Status  New      Additional Long Term Goals   Additional Long Term Goals  Yes      PT LONG TERM GOAL #6   Title  Pt will improve FGA to >/=24/30 in order to indicate  decreased fall risk.     Time  8    Period  Weeks    Status  New            Plan - 02/01/18 1652    Clinical Impression Statement  Pt limited today during high level balance exercises due to marked L hip abd and extensor weakness.  Verbally provided (and performed during session) SL L hip abd and prone L hip extension for HEP.  Pt verbalized and return demo during session.     Rehab Potential  Good    PT Frequency  2x / week   hold until 01/12/2018 when new insurance starts per pt request   PT Duration  8 weeks   hold until 01/12/2018 when new insurance starts per pt request   PT Treatment/Interventions  ADLs/Self Care Home Management;Canalith Repostioning;DME Instruction;Gait training;Stair training;Functional mobility training;Therapeutic activities;Therapeutic exercise;Balance training;Neuromuscular re-education;Patient/family education;Prosthetic Training;Manual techniques;Vestibular    PT Next Visit Plan  Robin-I'm kind of stuck with him.  His balance is still pretty bad but I think its mostly related to hip weakness.  Still unsure he is adjusting socks as needed-give me some feedback please. , (keep a check on LTGs, Raquel Sarna added FGA goal but feel he won't need full 8 weeks) continue to work on gait/barriers with prosthesis only, begin work simulation activities with pushing/pulling, climbing ladders, continue with lifting and carrying weighted objects, continue with balance with decr UE support.     Consulted and Agree with Plan of Care  Patient       Patient will benefit from skilled therapeutic intervention in order to improve the following deficits and impairments:  Abnormal gait, Decreased activity tolerance, Decreased balance, Decreased endurance, Decreased knowledge of use of DME, Decreased mobility, Decreased range of motion, Dizziness, Impaired flexibility, Postural dysfunction, Prosthetic Dependency  Visit Diagnosis: Other abnormalities of gait and mobility  Unsteadiness on  feet  Muscle weakness (generalized)  Abnormal posture     Problem List Patient Active Problem List   Diagnosis Date Noted  . Abnormality of gait 12/02/2017  . Labile blood pressure   . Labile blood glucose   . Amputation of left lower extremity below knee ([place]) 09/09/2017  . Benign prostatic hyperplasia   . Hyperlipidemia   . Stage 3 chronic kidney disease ([place])   . Neuropathic pain   . Osteomyelitis of left foot ([place])   . Coronary artery disease involving native coronary artery of native heart without angina pectoris   . Benign essential HTN   .  Anemia of chronic disease   . Leukocytosis   . Pressure injury of skin 08/24/2017  . Cervical spondylosis with myelopathy and radiculopathy 05/07/2017  . ARF (acute renal failure) ([place]) 04/30/2017  . Upper respiratory infection 04/29/2017  . Fatty liver 04/04/2017  . DDD (degenerative disc disease), cervical 01/13/2017  . PAD (peripheral artery disease) ([place]) 08/07/2016  . Ulcer of ankle ([place]) 08/07/2016  . Chronic diarrhea 05/17/2016  . Hyponatremia 05/17/2016  . Anemia 01/12/2014  . Disorder of ligament of ankle 07/31/2013  . Edema 06/28/2013  . 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 Obesity (BMI 30-39.9) 06/12/2013  . Pain in joint, shoulder region 06/12/2013  . Statin intolerance 06/10/2013  . DM (diabetes mellitus), type 2, uncontrolled w/ophthalmic complication ([place]) 20/12/4177  . Hyperlipidemia associated with type 2 diabetes mellitus ([place]) 07/17/2006  . GILBERT'S SYNDROME 07/17/2006  . Essential hypertension 07/17/2006  . Coronary atherosclerosis 07/17/2006  . OSTEOARTHRITIS 07/17/2006  . COUGH 07/17/2006    Cameron Sprang, PT, MPT [facility] [address] [place], [place], [address] Phone: [phone]   Fax:  [phone] 02/01/18, 4:55 PM  Name: NAVRAJ DREIBELBIS MRN: [id number] Date of Birth: 08/02/1965

## 2018-02-01 NOTE — Progress Notes (Signed)
Subjective:    Patient ID: Chase Clark, male    DOB: [birth year], 52 y.o.   MRN: [id number]  HPI Pt returns for f/u of diabetes mellitus: DM type: 1 Dx'ed: 7829 Complications: polyneuropathy, renal failure, PAD, PDR, left BKA and CAD Therapy: insulin since 2009 DKA: last episode was early 2019 Severe hypoglycemia: never Pancreatitis: never Pancreatic imaging: never Other: he takes multiple daily injections Interval history: He has had to reduce humalog to 25 units 3 times a day (just before each meal), due to hypoglycemia.  no cbg record, but states cbg's are highest fasting (higher than at HS).  pt states he feels well in general.   Past Medical History:  Diagnosis Date  . Arthritis   . Asthma    as child  . Coronary artery disease    DES DIAG1 11/20/09 [facility])  . Diabetes mellitus without complication ([place])   . Diabetic osteomyelitis ([place])   . Diabetic ulcer of foot with muscle involvement without evidence of necrosis ([place])    DIABETIC ULCERATIONS ASSOCIATED WITH IRRITATION LATERAL ANKLE LEFT GREATER THAN RIGHT WITH MILD CELLULITIS   . Diverticulosis   . DKA (diabetic ketoacidoses) ([place]) 08/23/2017  . Edema, lower extremity   . Fatty liver   . GERD (gastroesophageal reflux disease)   . Gilbert's disease   . Glaucoma   . Heart disease 2011   Patient has stent for 80% blockage  . Hyperlipidemia   . Hypertension   . Neuropathy   . Seasonal allergies   . Shoulder pain   . Ulcer of foot ([place]) 08.06.2014   DIABETIC ULCERATIONS ASSOCIATED WITH IRRITATION LATERAL ANKLE LEFT GREATER THAN RIGHT WITH MILD CELLULITIS    Past Surgical History:  Procedure Laterality Date  . ABDOMINAL AORTOGRAM N/A 05/20/2016   Procedure: Abdominal Aortogram possible intervention;  Surgeon: Katha Cabal, MD;  Location: [place] [facility];  Service: Cardiovascular;  Laterality: N/A;  . AMPUTATION Left 09/05/2017   Procedure: AMPUTATION BELOW KNEE;  Surgeon: Tania Ade, MD;  Location: [place];  Service: Orthopedics;  Laterality: Left;  . ANTERIOR CERVICAL DECOMP/DISCECTOMY FUSION N/A 05/07/2017   Procedure: ANTERIOR CERVICAL DECOMPRESSION/DISCECTOMY Luevenia Maxin PROSTHESIS,PLATE/SCREWS CERVICAL FIVE - CERVICAL SIX;  Surgeon: Newman Pies, MD;  Location: [place];  Service: Neurosurgery;  Laterality: N/A;  . CARDIAC CATHETERIZATION    . CATARACT EXTRACTION W/ INTRAOCULAR LENS IMPLANT    . CATARACT EXTRACTION W/PHACO Left 02/26/2017   Procedure: CATARACT EXTRACTION PHACO AND INTRAOCULAR LENS PLACEMENT (IOC);  Surgeon: Eulogio Bear, MD;  Location: [facility];  Service: Ophthalmology;  Laterality: Left;  Lot # [id number] H [place]: 00:25.3 AP%: 6.2 CDE: 1.59   . CHOLECYSTECTOMY N/A   . CORONARY ANGIOPLASTY WITH STENT PLACEMENT  2007   1st diagonal  . EPIBLEPHERON REPAIR WITH TEAR DUCT PROBING    . EYE SURGERY Right sept 29n2015   cataract extraction  . INSERTION EXPRESS TUBE SHUNT Right 09/06/2015   Procedure: INSERTION AHMED TUBE SHUNT with tutoplast allograft;  Surgeon: Eulogio Bear, MD;  Location: [facility];  Service: Ophthalmology;  Laterality: Right;  Marland Kitchen VASECTOMY      Social History   Socioeconomic History  . Marital status: Married    Spouse name: Not on file  . Number of children: Not on file  . Years of education: Not on file  . Highest education level: Not on file  Occupational History  . Not on file  Social Needs  . Financial resource strain: Not on file  . Food  insecurity:    Worry: Not on file    Inability: Not on file  . Transportation needs:    Medical: Not on file    Non-medical: Not on file  Tobacco Use  . Smoking status: Former Smoker    Types: Cigarettes    Last attempt to quit: 03/14/2008    Years since quitting: 9.8  . Smokeless tobacco: Never Used  Substance and Sexual Activity  . Alcohol use: Yes    Comment: OCCAS  . Drug use: No  . Sexual activity: Not on file  Lifestyle  . Physical activity:    Days per week: Not on file     Minutes per session: Not on file  . Stress: Not on file  Relationships  . Social connections:    Talks on phone: Not on file    Gets together: Not on file    Attends religious service: Not on file    Active member of club or organization: Not on file    Attends meetings of clubs or organizations: Not on file    Relationship status: Not on file  . Intimate partner violence:    Fear of current or ex partner: Not on file    Emotionally abused: Not on file    Physically abused: Not on file    Forced sexual activity: Not on file  Other Topics Concern  . Not on file  Social History Narrative   Used to work as paramedic     Current Outpatient Medications on File Prior to Visit  Medication Sig Dispense Refill  . amLODipine (NORVASC) 10 MG tablet Take 1 tablet (10 mg total) by mouth daily. 30 tablet 0  . aspirin EC 81 MG tablet Take 81 mg by mouth daily.    . brimonidine-timolol (COMBIGAN) 0.2-0.5 % ophthalmic solution Place 1 drop into the right eye every 12 (twelve) hours.     . Continuous Blood Gluc Receiver (Gilbert) [place] 1 each by Does not apply route as directed. Use to check blood glucose 5 times daily 1 Device 0  . Continuous Blood Gluc Sensor (DEXCOM G6 SENSOR) MISC 1 each by Does not apply route as directed. Place 1 sensor subcutaneously once every 10 days. 3 each 11  . Continuous Blood Gluc Transmit (DEXCOM G6 TRANSMITTER) MISC 1 each by Does not apply route as directed. Use to check blood glucose 5 times daily. Refill every 3 months. 1 each 3  . DUREZOL 0.05 % EMUL Place 1 drop into the left eye at bedtime  1  . esomeprazole (NEXIUM) 40 MG capsule Take 1 capsule (40 mg total) by mouth daily. 30 capsule 0  . gabapentin (NEURONTIN) 300 MG capsule Take 2 capsules (600 mg total) by mouth 2 (two) times daily. 360 capsule 3  . latanoprost (XALATAN) 0.005 % ophthalmic solution Place 1 drop into both eyes at bedtime.  5  . metoprolol tartrate (LOPRESSOR) 100 MG tablet Take 0.5  tablets (50 mg total) by mouth 2 (two) times daily. 90 tablet 1  . rosuvastatin (CRESTOR) 10 MG tablet Take 1 tablet (10 mg total) by mouth daily. 30 tablet 3  . sulfamethoxazole-trimethoprim (BACTRIM DS,SEPTRA DS) 800-160 MG tablet Take 1 tablet by mouth 2 (two) times daily. With food 8 tablet 0  . Sulfamethoxazole-Trimethoprim (BACTRIM PO) Take by mouth.    Marland Kitchen acetaminophen (TYLENOL) 325 MG tablet Take 1 tablet (325 mg total) by mouth every 6 (six) hours as needed for mild pain (pain score 1-3 or temp >  100.5). (Patient not taking: Reported on 02/01/2018) 30 tablet 0  . bethanechol (URECHOLINE) 50 MG tablet 50 mg 3 times daily x1 week and 50 mg twice daily x1 week then 50 mg daily x1 week and stop (Patient not taking: Reported on 02/01/2018) 45 tablet 0  . losartan-hydrochlorothiazide (HYZAAR) 100-25 MG tablet Take by mouth.    . mupirocin ointment (BACTROBAN) 2 % Apply 1 application topically 3 (three) times daily. Right leg (Patient not taking: Reported on 02/01/2018) 60 g 0  . oxyCODONE (OXY IR/ROXICODONE) 5 MG immediate release tablet Take 1 tablet (5 mg total) by mouth every 6 (six) hours as needed for severe pain (pain score 7-10). (Patient not taking: Reported on 02/01/2018) 40 tablet 0  . polyethylene glycol (MIRALAX / GLYCOLAX) packet Take 17 g by mouth daily. (Patient not taking: Reported on 02/01/2018)    . tamsulosin (FLOMAX) 0.4 MG CAPS capsule Take 2 capsules (0.8 mg total) by mouth daily. (Patient not taking: Reported on 02/01/2018) 30 capsule 1   No current facility-administered medications on file prior to visit.     Allergies  Allergen Reactions  . Atorvastatin Other (See Comments)    Muscle aches     Family History  Problem Relation Age of Onset  . Hyperlipidemia Mother   . Diabetes Mother   . Liver disease Mother        NASH  . Hyperlipidemia Father   . Diabetes Father   . Heart disease Father   . Hypertension Father     BP (!) 142/78 (BP Location: Right Arm,  Patient Position: Sitting, Cuff Size: Normal)   Pulse 72   Ht 5\' 10"  (1.778 m)   Wt 221 lb (100.2 kg)   SpO2 97%   BMI 31.71 kg/m    Review of Systems Denies LOC.     Objective:   Physical Exam VITAL SIGNS:  See vs page GENERAL: no distress Pulses: right dorsalis pedis is intact.   MSK: no deformity of the right foot   CV: 1+ right leg edema.   Skin:  no ulcer on the right foot.  normal color and temp on the right foot.   Neuro: sensation is intact to touch on the right foot, but severely decreased from normal. Ext: left BKA.     A1c=10.8%    Assessment & Plan:  HTN: is noted today Type 1 DM, with PAD: he needs increased rx Postprandial hypoglycemia, new to me.  Despite renal insuff, we should emphasize basal insulin.   Patient Instructions  Your blood pressure is high today.  Please see your primary care provider soon, to have it rechecked check your blood sugar twice a day.  vary the time of day when you check, between before the 3 meals, and at bedtime.  also check if you have symptoms of your blood sugar being too high or too low.  please keep a record of the readings and bring it to your next appointment here (or you can bring the meter itself).  You can write it on any piece of paper.  please call us sooner if your blood sugar goes below 70, or if you have a lot of readings over 200. Please continue the same humalog: 25 units 3 times a day (just before each meal), and: Change the lantus to 120 units each morning.    Please come back for a follow-up appointment in 8 weeks.  Please see Vaughan Basta the same day, to consider using a pump.

## 2018-02-01 NOTE — Patient Instructions (Addendum)
Your blood pressure is high today.  Please see your primary care provider soon, to have it rechecked check your blood sugar twice a day.  vary the time of day when you check, between before the 3 meals, and at bedtime.  also check if you have symptoms of your blood sugar being too high or too low.  please keep a record of the readings and bring it to your next appointment here (or you can bring the meter itself).  You can write it on any piece of paper.  please call us sooner if your blood sugar goes below 70, or if you have a lot of readings over 200. Please continue the same humalog: 25 units 3 times a day (just before each meal), and: Change the lantus to 120 units each morning.    Please come back for a follow-up appointment in 8 weeks.  Please see Vaughan Basta the same day, to consider using a pump.

## 2018-02-03 ENCOUNTER — Ambulatory Visit: Payer: 59 | Admitting: Physical Therapy

## 2018-02-03 ENCOUNTER — Encounter: Payer: Self-pay | Admitting: Physical Therapy

## 2018-02-03 DIAGNOSIS — M6281 Muscle weakness (generalized): Secondary | ICD-10-CM | POA: Diagnosis not present

## 2018-02-03 DIAGNOSIS — Z9181 History of falling: Secondary | ICD-10-CM | POA: Diagnosis not present

## 2018-02-03 DIAGNOSIS — R293 Abnormal posture: Secondary | ICD-10-CM

## 2018-02-03 DIAGNOSIS — R2681 Unsteadiness on feet: Secondary | ICD-10-CM | POA: Diagnosis not present

## 2018-02-03 DIAGNOSIS — R2689 Other abnormalities of gait and mobility: Secondary | ICD-10-CM | POA: Diagnosis not present

## 2018-02-04 NOTE — Therapy (Signed)
[facility] [address] [place], [place], [address] Phone: [phone]   Fax:  [phone]  Physical Therapy Treatment  Patient Details  Name: Chase Clark MRN: [id number] Date of Birth: [birth year] Referring Provider (PT): Grier Mitts, [place]   Encounter Date: 02/03/2018  PT End of Session - 02/03/18 1910    Visit Number  9    Number of Visits  21    Date for PT Re-Evaluation  03/19/18    Authorization Type  [facility] as of 01/12/18. has not recieved cards as yet. will need to verify insurance when cards recieved.     PT Start Time  1233    PT Stop Time  1315    PT Time Calculation (min)  42 min    Equipment Utilized During Treatment  Gait belt    Activity Tolerance  Patient tolerated treatment well;No increased pain;Patient limited by fatigue    Behavior During Therapy  [facility] for tasks assessed/performed       Past Medical History:  Diagnosis Date  . Arthritis   . Asthma    as child  . Coronary artery disease    DES DIAG1 11/20/09 [facility])  . Diabetes mellitus without complication ([place])   . Diabetic osteomyelitis ([place])   . Diabetic ulcer of foot with muscle involvement without evidence of necrosis ([place])    DIABETIC ULCERATIONS ASSOCIATED WITH IRRITATION LATERAL ANKLE LEFT GREATER THAN RIGHT WITH MILD CELLULITIS   . Diverticulosis   . DKA (diabetic ketoacidoses) ([place]) 08/23/2017  . Edema, lower extremity   . Fatty liver   . GERD (gastroesophageal reflux disease)   . Gilbert's disease   . Glaucoma   . Heart disease 2011   Patient has stent for 80% blockage  . Hyperlipidemia   . Hypertension   . Neuropathy   . Seasonal allergies   . Shoulder pain   . Ulcer of foot ([place]) 08.06.2014   DIABETIC ULCERATIONS ASSOCIATED WITH IRRITATION LATERAL ANKLE LEFT GREATER THAN RIGHT WITH MILD CELLULITIS    Past Surgical History:  Procedure Laterality Date  . ABDOMINAL AORTOGRAM N/A 05/20/2016   Procedure: Abdominal  Aortogram possible intervention;  Surgeon: Katha Cabal, MD;  Location: [facility];  Service: Cardiovascular;  Laterality: N/A;  . AMPUTATION Left 09/05/2017   Procedure: AMPUTATION BELOW KNEE;  Surgeon: Tania Ade, MD;  Location: [facility];  Service: Orthopedics;  Laterality: Left;  . ANTERIOR CERVICAL DECOMP/DISCECTOMY FUSION N/A 05/07/2017   Procedure: ANTERIOR CERVICAL DECOMPRESSION/DISCECTOMY Luevenia Maxin PROSTHESIS,PLATE/SCREWS CERVICAL FIVE - CERVICAL SIX;  Surgeon: Newman Pies, MD;  Location: [facility];  Service: Neurosurgery;  Laterality: N/A;  . CARDIAC CATHETERIZATION    . CATARACT EXTRACTION W/ INTRAOCULAR LENS IMPLANT    . CATARACT EXTRACTION W/PHACO Left 02/26/2017   Procedure: CATARACT EXTRACTION PHACO AND INTRAOCULAR LENS PLACEMENT (IOC);  Surgeon: Eulogio Bear, MD;  Location: [facility];  Service: Ophthalmology;  Laterality: Left;  Lot # [id number] H [place]: 00:25.3 AP%: 6.2 CDE: 1.59   . CHOLECYSTECTOMY N/A   . CORONARY ANGIOPLASTY WITH STENT PLACEMENT  2007   1st diagonal  . EPIBLEPHERON REPAIR WITH TEAR DUCT PROBING    . EYE SURGERY Right sept 29n2015   cataract extraction  . INSERTION EXPRESS TUBE SHUNT Right 09/06/2015   Procedure: INSERTION AHMED TUBE SHUNT with tutoplast allograft;  Surgeon: Eulogio Bear, MD;  Location: [facility];  Service: Ophthalmology;  Laterality: Right;  Marland Kitchen VASECTOMY      There were no vitals filed for  this visit.  Subjective Assessment - 02/03/18 1230    Subjective  No falls. His has some  muscle soreness from last PT session.     Pertinent History  LTTA, C5-6 fusion sg, DM, neuropathy, CKD, CAD, arthritis, HTN    Limitations  Lifting;Standing;Walking;House hold activities    Patient Stated Goals  He would like to use prosthesis to increase mobility. 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     Currently in Pain?  No/denies                       [facility] Adult PT Treatment/Exercise -  02/03/18 1235      Transfers   Transfers  Floor to Transfer    Floor to Transfer  5: Supervision;With upper extremity assist   pushing on mat table   Floor to Transfer Details (indicate cue type and reason)  PT demo & instructed in floor transfer leading with either LE. Pt return demo understanding. PT recommended 3-5 reps daily ea way to build LE strength in addition to skill getting on/off floor.       Ambulation/Gait   Ambulation/Gait  Yes    Ambulation/Gait Assistance  5: Supervision    Ambulation/Gait Assistance Details  worked on maintaining path with head turns to scan right /left & up/down and increasing pace with longer step length.     Ambulation Distance (Feet)  500 Feet    Assistive device  Prosthesis;None    Ambulation Surface  Indoor;Level      Therapeutic Activites    Lifting  PT demo & verbal cues on picking up 20# box with proper body mechanics. Pt return demo lifting & placing back on floor.     Work Radiation protection practitioner, instructed in pushing (shoveling, vacuuming) and pulling (raking) activities with weight shift & position of feet. Pt return demo understanding.  PT demo, instructed in climbing ladder and perfrom 2 handed task. Pt return demo understanding with tactile & verbal cues.       Exercises   Exercises  Other Exercises    Other Exercises   PT instructed in use of UE exercise equipment & how to determine amount of weight including shoulder & biceps/triceps machines.      Knee/Hip Exercises: Aerobic   Tread Mill  PT demo, instructed pt in safety of use of Treadmill. Pt return demo understanding and ambulated up to 2.28mh for 5 minutes.       Knee/Hip Exercises: Machines for Strengthening   Total Gym Leg Press  BLE 100# 15 reps & 120# 15 reps.       Other Machine  PT instructed in use of LE exercise equipment & how to determine amount of weight including Knee extension & Knee flexion machines.               PT Short Term Goals - 01/28/18 1542       PT SHORT TERM GOAL #1   Title  Patient verbalizes proper cleaning of prosthesis and demonstrates proper donning. (All STGs Target Date: 02/05/2018)    Time  4    Period  Weeks    Status  Achieved      PT SHORT TERM GOAL #2   Title  Patient tolerates prosthesis wear >10 hrs /day without skin issues.    Time  4    Period  Weeks    Status  Achieved      PT SHORT TERM  GOAL #3   Title  Patient ambulates 500' and negotiates ramps, curbs and stairs (step-to pattern) with prosthesis & RW modified indpendent.     Baseline  met without RW    Time  4    Period  Weeks    Status  Achieved      PT SHORT TERM GOAL #4   Title  Patient ambulates 200' negotiating around obstacles and scanning environment with minA.     Baseline  S level     Time  4    Period  Weeks    Status  Achieved      PT SHORT TERM GOAL #5   Title  Patient picks up objects from floor without UE support with supervision.     Time  4    Period  Weeks    Status  Achieved        PT Long Term Goals - 02/03/18 1913      PT LONG TERM GOAL #1   Title  Patient verbalizes & demonstrates proper prosthetic care to enable safe function with prosthesis. (All LTGs Target Date 03/05/2018)    Time  8    Period  Weeks    Status  On-going    Target Date  03/05/18      PT LONG TERM GOAL #2   Title  Patient tolerates wear of prosthesis >90% of awake hours without skin issues or limb pain to enable function during his day.     Time  8    Period  Weeks    Status  On-going    Target Date  03/05/18      PT LONG TERM GOAL #3   Title  Berg Balance >52/56 to indicate lower fall risk.     Time  8    Period  Weeks    Status  Revised    Target Date  03/05/18      PT LONG TERM GOAL #4   Title  Patient ambulates 1000' outdoors including grass, ramps & curbs with prosthesis only to enable community mobility.     Time  8    Status  Revised    Target Date  03/05/18      PT LONG TERM GOAL #5   Title  Patient lifts & carries 25# box  with prosthesis only modified independent.     Time  8    Period  Weeks    Status  Revised    Target Date  03/05/18      PT LONG TERM GOAL #6   Title  Pt will improve FGA to >/=24/30 in order to indicate decreased fall risk.     Time  8    Period  Weeks    Status  On-going    Target Date  03/05/18            Plan - 02/03/18 1928    Clinical Impression Statement  Patient has better understanding of use of weight machines & treadmill for fitness. He improved work simulated activities like lifting, pushing, pulling, floor transfer & climbing a ladder with skilled PT instruction.     Rehab Potential  Good    PT Frequency  2x / week   hold until 01/12/2018 when new insurance starts per pt request   PT Duration  8 weeks   hold until 01/12/2018 when new insurance starts per pt request   PT Treatment/Interventions  ADLs/Self Care Home Management;Canalith Repostioning;DME Instruction;Gait training;Stair training;Functional mobility training;Therapeutic activities;Therapeutic exercise;Balance  training;Neuromuscular re-education;Patient/family education;Prosthetic Training;Manual techniques;Vestibular    PT Next Visit Plan  Continue to work on fitness training to enable him to progress further after PT d/c and work simulation activities.     Consulted and Agree with Plan of Care  Patient       Patient will benefit from skilled therapeutic intervention in order to improve the following deficits and impairments:  Abnormal gait, Decreased activity tolerance, Decreased balance, Decreased endurance, Decreased knowledge of use of DME, Decreased mobility, Decreased range of motion, Dizziness, Impaired flexibility, Postural dysfunction, Prosthetic Dependency  Visit Diagnosis: Other abnormalities of gait and mobility  Unsteadiness on feet  Muscle weakness (generalized)  Abnormal posture  History of falling     Problem List Patient Active Problem List   Diagnosis Date Noted  .  Abnormality of gait 12/02/2017  . Labile blood pressure   . Labile blood glucose   . Amputation of left lower extremity below knee ([place]) 09/09/2017  . Benign prostatic hyperplasia   . Hyperlipidemia   . Stage 3 chronic kidney disease ([place])   . Neuropathic pain   . Osteomyelitis of left foot ([place])   . Coronary artery disease involving native coronary artery of native heart without angina pectoris   . Benign essential HTN   . Anemia of chronic disease   . Leukocytosis   . Pressure injury of skin 08/24/2017  . Cervical spondylosis with myelopathy and radiculopathy 05/07/2017  . ARF (acute renal failure) ([place]) 04/30/2017  . Upper respiratory infection 04/29/2017  . Fatty liver 04/04/2017  . DDD (degenerative disc disease), cervical 01/13/2017  . PAD (peripheral artery disease) ([place]) 08/07/2016  . Ulcer of ankle ([place]) 08/07/2016  . Chronic diarrhea 05/17/2016  . Hyponatremia 05/17/2016  . Anemia 01/12/2014  . Disorder of ligament of ankle 07/31/2013  . Edema 06/28/2013  . 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 Obesity (BMI 30-39.9) 06/12/2013  . Pain in joint, shoulder region 06/12/2013  . Statin intolerance 06/10/2013  . DM (diabetes mellitus), type 2, uncontrolled w/ophthalmic complication ([place]) 35/45/6256  . Hyperlipidemia associated with type 2 diabetes mellitus ([place]) 07/17/2006  . GILBERT'S SYNDROME 07/17/2006  . Essential hypertension 07/17/2006  . Coronary atherosclerosis 07/17/2006  . OSTEOARTHRITIS 07/17/2006  . COUGH 07/17/2006    WALDRON,ROBIN PT, DPT 02/04/2018, 9:32 AM  [facility] [address] [place], [place], [address] Phone: [phone]   Fax:  [phone]  Name: WILBUR [place] MRN: [id number] Date of Birth: 31-Jul-1965

## 2018-02-08 ENCOUNTER — Ambulatory Visit: Payer: 59 | Admitting: Physical Therapy

## 2018-02-08 ENCOUNTER — Encounter: Payer: Self-pay | Admitting: Physical Therapy

## 2018-02-08 DIAGNOSIS — M6281 Muscle weakness (generalized): Secondary | ICD-10-CM | POA: Diagnosis not present

## 2018-02-08 DIAGNOSIS — Z9181 History of falling: Secondary | ICD-10-CM

## 2018-02-08 DIAGNOSIS — R2689 Other abnormalities of gait and mobility: Secondary | ICD-10-CM

## 2018-02-08 DIAGNOSIS — R293 Abnormal posture: Secondary | ICD-10-CM | POA: Diagnosis not present

## 2018-02-08 DIAGNOSIS — R2681 Unsteadiness on feet: Secondary | ICD-10-CM

## 2018-02-08 NOTE — Therapy (Signed)
[facility] [address] [place], [place], [address] Phone: [phone]   Fax:  [phone]  Physical Therapy Treatment  Patient Details  Name: Chase Clark MRN: [id number] Date of Birth: [birth year] Referring Provider (PT): Grier Mitts, [place]   Encounter Date: 02/08/2018  PT End of Session - 02/08/18 1243    Visit Number  10    Number of Visits  21    Date for PT Re-Evaluation  03/19/18    Authorization Type  [facility] as of 01/12/18. has not recieved cards as yet. will need to verify insurance when cards recieved.     PT Start Time  1147    PT Stop Time  1230    PT Time Calculation (min)  43 min    Equipment Utilized During Treatment  Gait belt    Activity Tolerance  Patient tolerated treatment well;No increased pain;Patient limited by fatigue    Behavior During Therapy  [facility] for tasks assessed/performed       Past Medical History:  Diagnosis Date  . Arthritis   . Asthma    as child  . Coronary artery disease    DES DIAG1 11/20/09 [facility])  . Diabetes mellitus without complication ([place])   . Diabetic osteomyelitis ([place])   . Diabetic ulcer of foot with muscle involvement without evidence of necrosis ([place])    DIABETIC ULCERATIONS ASSOCIATED WITH IRRITATION LATERAL ANKLE LEFT GREATER THAN RIGHT WITH MILD CELLULITIS   . Diverticulosis   . DKA (diabetic ketoacidoses) ([place]) 08/23/2017  . Edema, lower extremity   . Fatty liver   . GERD (gastroesophageal reflux disease)   . Gilbert's disease   . Glaucoma   . Heart disease 2011   Patient has stent for 80% blockage  . Hyperlipidemia   . Hypertension   . Neuropathy   . Seasonal allergies   . Shoulder pain   . Ulcer of foot ([place]) 08.06.2014   DIABETIC ULCERATIONS ASSOCIATED WITH IRRITATION LATERAL ANKLE LEFT GREATER THAN RIGHT WITH MILD CELLULITIS    Past Surgical History:  Procedure Laterality Date  . ABDOMINAL AORTOGRAM N/A 05/20/2016   Procedure: Abdominal  Aortogram possible intervention;  Surgeon: Katha Cabal, MD;  Location: [facility];  Service: Cardiovascular;  Laterality: N/A;  . AMPUTATION Left 09/05/2017   Procedure: AMPUTATION BELOW KNEE;  Surgeon: Tania Ade, MD;  Location: [facility];  Service: Orthopedics;  Laterality: Left;  . ANTERIOR CERVICAL DECOMP/DISCECTOMY FUSION N/A 05/07/2017   Procedure: ANTERIOR CERVICAL DECOMPRESSION/DISCECTOMY Luevenia Maxin PROSTHESIS,PLATE/SCREWS CERVICAL FIVE - CERVICAL SIX;  Surgeon: Newman Pies, MD;  Location: [facility];  Service: Neurosurgery;  Laterality: N/A;  . CARDIAC CATHETERIZATION    . CATARACT EXTRACTION W/ INTRAOCULAR LENS IMPLANT    . CATARACT EXTRACTION W/PHACO Left 02/26/2017   Procedure: CATARACT EXTRACTION PHACO AND INTRAOCULAR LENS PLACEMENT (IOC);  Surgeon: Eulogio Bear, MD;  Location: [facility];  Service: Ophthalmology;  Laterality: Left;  Lot # [id number] H [place]: 00:25.3 AP%: 6.2 CDE: 1.59   . CHOLECYSTECTOMY N/A   . CORONARY ANGIOPLASTY WITH STENT PLACEMENT  2007   1st diagonal  . EPIBLEPHERON REPAIR WITH TEAR DUCT PROBING    . EYE SURGERY Right sept 29n2015   cataract extraction  . INSERTION EXPRESS TUBE SHUNT Right 09/06/2015   Procedure: INSERTION AHMED TUBE SHUNT with tutoplast allograft;  Surgeon: Eulogio Bear, MD;  Location: [facility];  Service: Ophthalmology;  Laterality: Right;  Marland Kitchen VASECTOMY      There were no vitals filed for  this visit.  Subjective Assessment - 02/08/18 1152    Subjective  No falls. His has some  muscle soreness from church activities ([place] [employer]) standing on gravel.    Pertinent History  LTTA, C5-6 fusion sg, DM, neuropathy, CKD, CAD, arthritis, HTN    Limitations  Lifting;Standing;Walking;House hold activities    Patient Stated Goals  He would like to use prosthesis to increase mobility. 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     Currently in Pain?  No/denies                       [facility]  Adult PT Treatment/Exercise - 02/08/18 0001      Transfers   Transfers  Floor to Transfer    Floor to Transfer  5: Supervision;With upper extremity assist;Without upper extremity assist   progressing without external support   Floor to Transfer Details (indicate cue type and reason)  demonstrated technique without external support.  Worked on transitions tall knee to 1/2kneel, quadruped to side sit each side, multiple reps with each progressing without UE support.                                       Floor to Transfer Details  Verbal cues for technique      Ambulation/Gait   Ambulation/Gait  Yes    Ambulation/Gait Assistance  5: Supervision    Ambulation/Gait Assistance Details  gait training on treadmill for activity tolerance and maintaining proper gait mechanics and posture.  Treadmill speed 2.0 mph and elevation up to 5%.                 Ambulation Distance (Feet)  200 Feet   + 7 min on treadmill   Assistive device  Prosthesis;None;Other (Comment)   light UE support on on treadmill handles being able to contr   Gait Pattern  Step-through pattern;Decreased weight shift to left;Left flexed knee in stance;Lateral hip instability;Wide base of support    Ambulation Surface  Level;Indoor   elevated surface on treadmill     Exercises   Exercises       Prosthetics   Prosthetic Care Comments   Pt wearing all awake hours, removing as needed to dry limb/liner     Current prosthetic wear tolerance (days/week)   daily    Current prosthetic wear tolerance (#hours/day)   all awake hours    Education Provided  Correct ply sock adjustment    Person(s) Educated  Patient    Education Method  Explanation    Education Method  Verbalized understanding    Donning Prosthesis  Supervision    Doffing Prosthesis  Minimal assist          Balance Exercises - 02/08/18 1220      Balance Exercises: Standing   Tandem Gait  Intermittent upper extremity support    Retro Gait  Upper extremity support    intermittent UE support   Sidestepping  Other reps (comment)   Braiding, intermittent UE support       PT Education - 02/08/18 1241    Education Details  Educated to pt on balance reactions and strategies with balance exercises.    Person(s) Educated  Patient    Methods  Explanation;Demonstration;Verbal cues    Comprehension  Verbalized understanding;Returned demonstration;Need further instruction       PT Short Term Goals - 01/28/18  [place] #1   Title  Patient verbalizes proper cleaning of prosthesis and demonstrates proper donning. (All STGs Target Date: 02/05/2018)    Time  4    Period  Weeks    Status  Achieved      PT SHORT TERM GOAL #2   Title  Patient tolerates prosthesis wear >10 hrs /day without skin issues.    Time  4    Period  Weeks    Status  Achieved      PT SHORT TERM GOAL #3   Title  Patient ambulates 500' and negotiates ramps, curbs and stairs (step-to pattern) with prosthesis & RW modified indpendent.     Baseline  met without RW    Time  4    Period  Weeks    Status  Achieved      PT SHORT TERM GOAL #4   Title  Patient ambulates 200' negotiating around obstacles and scanning environment with minA.     Baseline  S level     Time  4    Period  Weeks    Status  Achieved      PT SHORT TERM GOAL #5   Title  Patient picks up objects from floor without UE support with supervision.     Time  4    Period  Weeks    Status  Achieved        PT Long Term Goals - 02/03/18 1913      PT LONG TERM GOAL #1   Title  Patient verbalizes & demonstrates proper prosthetic care to enable safe function with prosthesis. (All LTGs Target Date 03/05/2018)    Time  8    Period  Weeks    Status  On-going    Target Date  03/05/18      PT LONG TERM GOAL #2   Title  Patient tolerates wear of prosthesis >90% of awake hours without skin issues or limb pain to enable function during his day.     Time  8    Period  Weeks    Status  On-going     Target Date  03/05/18      PT LONG TERM GOAL #3   Title  Berg Balance >52/56 to indicate lower fall risk.     Time  8    Period  Weeks    Status  Revised    Target Date  03/05/18      PT LONG TERM GOAL #4   Title  Patient ambulates 1000' outdoors including grass, ramps & curbs with prosthesis only to enable community mobility.     Time  8    Status  Revised    Target Date  03/05/18      PT LONG TERM GOAL #5   Title  Patient lifts & carries 25# box with prosthesis only modified independent.     Time  8    Period  Weeks    Status  Revised    Target Date  03/05/18      PT LONG TERM GOAL #6   Title  Pt will improve FGA to >/=24/30 in order to indicate decreased fall risk.     Time  8    Period  Weeks    Status  On-going    Target Date  03/05/18            Plan - 02/08/18 1244    Clinical Impression Statement  Skilled session focused on prosthetic care (ply sock adjustment), floor transfers without external support, core strengthening/stability in tall knee, 1/2 kneel, and quadruped, dynamic standing balance, and gait training on treadmill with decrease UE support.  Pt performed acitivites with less UE support needed at supervision level.                                                                                                    Rehab Potential  Good    PT Frequency  2x / week   hold until 01/12/2018 when new insurance starts per pt request   PT Duration  8 weeks   hold until 01/12/2018 when new insurance starts per pt request   PT Treatment/Interventions  ADLs/Self Care Home Management;Canalith Repostioning;DME Instruction;Gait training;Stair training;Functional mobility training;Therapeutic activities;Therapeutic exercise;Balance training;Neuromuscular re-education;Patient/family education;Prosthetic Training;Manual techniques;Vestibular    PT Next Visit Plan  Continue to work on fitness training to enable him to progress further after PT d/c and work simulation  activities.     Consulted and Agree with Plan of Care  Patient       Patient will benefit from skilled therapeutic intervention in order to improve the following deficits and impairments:  Abnormal gait, Decreased activity tolerance, Decreased balance, Decreased endurance, Decreased knowledge of use of DME, Decreased mobility, Decreased range of motion, Dizziness, Impaired flexibility, Postural dysfunction, Prosthetic Dependency  Visit Diagnosis: Other abnormalities of gait and mobility  Unsteadiness on feet  Muscle weakness (generalized)  Abnormal posture  History of falling     Problem List Patient Active Problem List   Diagnosis Date Noted  . Abnormality of gait 12/02/2017  . Labile blood pressure   . Labile blood glucose   . Amputation of left lower extremity below knee ([place]) 09/09/2017  . Benign prostatic hyperplasia   . Hyperlipidemia   . Stage 3 chronic kidney disease ([place])   . Neuropathic pain   . Osteomyelitis of left foot ([place])   . Coronary artery disease involving native coronary artery of native heart without angina pectoris   . Benign essential HTN   . Anemia of chronic disease   . Leukocytosis   . Pressure injury of skin 08/24/2017  . Cervical spondylosis with myelopathy and radiculopathy 05/07/2017  . ARF (acute renal failure) ([place]) 04/30/2017  . Upper respiratory infection 04/29/2017  . Fatty liver 04/04/2017  . DDD (degenerative disc disease), cervical 01/13/2017  . PAD (peripheral artery disease) ([place]) 08/07/2016  . Ulcer of ankle ([place]) 08/07/2016  . Chronic diarrhea 05/17/2016  . Hyponatremia 05/17/2016  . Anemia 01/12/2014  . Disorder of ligament of ankle 07/31/2013  . Edema 06/28/2013  . 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 Obesity (BMI 30-39.9) 06/12/2013  . Pain in joint, shoulder region 06/12/2013  . Statin intolerance 06/10/2013  . DM (diabetes mellitus), type 2, uncontrolled w/ophthalmic complication ([place]) 41/28/7867  .  Hyperlipidemia associated with type 2 diabetes mellitus ([place]) 07/17/2006  . GILBERT'S SYNDROME 07/17/2006  . Essential hypertension 07/17/2006  . Coronary atherosclerosis 07/17/2006  . OSTEOARTHRITIS 07/17/2006  . COUGH 07/17/2006    Bjorn Loser, PTA  02/08/18, 12:50 PM [facility] [address] [place], [place], [address] Phone: [phone]   Fax:  [phone]  Name: SHIHAB STATES MRN: [id number] Date of Birth: September 30, 1965

## 2018-02-10 ENCOUNTER — Ambulatory Visit: Payer: 59 | Admitting: Physical Therapy

## 2018-02-10 ENCOUNTER — Encounter: Payer: Self-pay | Admitting: Physical Therapy

## 2018-02-10 DIAGNOSIS — Z9181 History of falling: Secondary | ICD-10-CM | POA: Diagnosis not present

## 2018-02-10 DIAGNOSIS — M6281 Muscle weakness (generalized): Secondary | ICD-10-CM

## 2018-02-10 DIAGNOSIS — R293 Abnormal posture: Secondary | ICD-10-CM | POA: Diagnosis not present

## 2018-02-10 DIAGNOSIS — R2689 Other abnormalities of gait and mobility: Secondary | ICD-10-CM

## 2018-02-10 DIAGNOSIS — R2681 Unsteadiness on feet: Secondary | ICD-10-CM | POA: Diagnosis not present

## 2018-02-11 NOTE — Therapy (Signed)
[facility] [address] [place], [place], [address] Phone: [phone]   Fax:  [phone]   PHYSICAL THERAPY DISCHARGE SUMMARY  Visits from Start of Care: 11  Current functional level related to goals / functional outcomes: See below   Remaining deficits: See below   Education / Equipment: Prosthetic care & use; HEP/fitness plan; work simulation tasks with prosthesis  Plan: Patient agrees to discharge.  Patient goals were met. Patient is being discharged due to meeting the stated rehab goals.  ?????          Physical Therapy Treatment  Patient Details  Name: Chase Clark MRN: [id number] Date of Birth: 1965/04/19 Referring Provider (PT): Grier Mitts, [place]   Encounter Date: 02/10/2018  PT End of Session - 02/10/18 1800    Visit Number  11    Number of Visits  21    Date for PT Re-Evaluation  03/19/18    Authorization Type  [facility] as of 01/12/18. $300 deductible, $20 co-pay after deductible met    PT Start Time  1445    PT Stop Time  1530    PT Time Calculation (min)  45 min    Equipment Utilized During Treatment  Gait belt    Activity Tolerance  Patient tolerated treatment well;No increased pain    Behavior During Therapy  WFL for tasks assessed/performed       Past Medical History:  Diagnosis Date  . Arthritis   . Asthma    as child  . Coronary artery disease    DES DIAG1 11/20/09 [facility])  . Diabetes mellitus without complication ([place])   . Diabetic osteomyelitis ([place])   . Diabetic ulcer of foot with muscle involvement without evidence of necrosis ([place])    DIABETIC ULCERATIONS ASSOCIATED WITH IRRITATION LATERAL ANKLE LEFT GREATER THAN RIGHT WITH MILD CELLULITIS   . Diverticulosis   . DKA (diabetic ketoacidoses) ([place]) 08/23/2017  . Edema, lower extremity   . Fatty liver   . GERD (gastroesophageal reflux disease)   . Gilbert's disease   . Glaucoma   . Heart disease 2011   Patient has stent  for 80% blockage  . Hyperlipidemia   . Hypertension   . Neuropathy   . Seasonal allergies   . Shoulder pain   . Ulcer of foot ([place]) 08.06.2014   DIABETIC ULCERATIONS ASSOCIATED WITH IRRITATION LATERAL ANKLE LEFT GREATER THAN RIGHT WITH MILD CELLULITIS    Past Surgical History:  Procedure Laterality Date  . ABDOMINAL AORTOGRAM N/A 05/20/2016   Procedure: Abdominal Aortogram possible intervention;  Surgeon: Katha Cabal, MD;  Location: [facility];  Service: Cardiovascular;  Laterality: N/A;  . AMPUTATION Left 09/05/2017   Procedure: AMPUTATION BELOW KNEE;  Surgeon: Tania Ade, MD;  Location: [facility];  Service: Orthopedics;  Laterality: Left;  . ANTERIOR CERVICAL DECOMP/DISCECTOMY FUSION N/A 05/07/2017   Procedure: ANTERIOR CERVICAL DECOMPRESSION/DISCECTOMY [place]ia Maxin PROSTHESIS,PLATE/SCREWS CERVICAL FIVE - CERVICAL SIX;  Surgeon: Newman Pies, MD;  Location: [facility];  Service: Neurosurgery;  Laterality: N/A;  . CARDIAC CATHETERIZATION    . CATARACT EXTRACTION W/ INTRAOCULAR LENS IMPLANT    . CATARACT EXTRACTION W/PHACO Left 02/26/2017   Procedure: CATARACT EXTRACTION PHACO AND INTRAOCULAR LENS PLACEMENT (IOC);  Surgeon: Eulogio Bear, MD;  Location: [facility];  Service: Ophthalmology;  Laterality: Left;  Lot # [id number] H [place]: 00:25.3 AP%: 6.2 CDE: 1.59   . CHOLECYSTECTOMY N/A   . CORONARY ANGIOPLASTY WITH STENT PLACEMENT  2007   1st diagonal  .  EPIBLEPHERON REPAIR WITH TEAR DUCT PROBING    . EYE SURGERY Right sept 29n2015   cataract extraction  . INSERTION EXPRESS TUBE SHUNT Right 09/06/2015   Procedure: INSERTION AHMED TUBE SHUNT with tutoplast allograft;  Surgeon: Eulogio Bear, MD;  Location: [facility];  Service: Ophthalmology;  Laterality: Right;  Marland Kitchen VASECTOMY      There were no vitals filed for this visit.  Subjective Assessment - 02/10/18 1450    Subjective  No falls. Wearing prosthesis most of awake hours without issues.     Pertinent History   LTTA, C5-6 fusion sg, DM, neuropathy, CKD, CAD, arthritis, HTN    Limitations  Lifting;Standing;Walking;House hold activities    Patient Stated Goals  He would like to use prosthesis to increase mobility. 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     Currently in Pain?  No/denies         [facility] PT Assessment - 02/10/18 1445      Transfers   Transfers  Sit to Stand;Stand to Sit;Floor to Transfer    Sit to Stand  7: Independent;Without upper extremity assist;From chair/3-in-1    Stand to Sit  7: Independent;Without upper extremity assist;To chair/3-in-1    Floor to Transfer  6: Modified independent (Device/Increase time);With upper extremity assist   pushing off floor only   Comments  PT demo, instructed in use of garden kneel bench both directions. Pt return demo understanding & reports plans to purchase one.       Ambulation/Gait   Ambulation/Gait  Yes    Ambulation/Gait Assistance  7: Independent    Ambulation Distance (Feet)  1200 Feet    Assistive device  Prosthesis;None    Gait Pattern  Within Functional Limits    Ambulation Surface  Indoor;Level;Outdoor;Paved;Grass    Gait velocity  3.82 ft/sec    Stairs  Yes    Stairs Assistance  6: Modified independent (Device/Increase time)    Stair Management Technique  No rails;One rail Right;Alternating pattern;Forwards   ascends no rails, descends single rail   Number of Stairs  12    Ramp  6: Modified independent (Device)   prosthesis only   Curb  6: Modified independent (Device/increase time)   prosthesis only     Standardized Balance Assessment   Standardized Balance Assessment  Berg Balance Test      Berg Balance Test   Sit to Stand  Able to stand without using hands and stabilize independently    Standing Unsupported  Able to stand safely 2 minutes    Sitting with Back Unsupported but Feet Supported on Floor or Stool  Able to sit safely and securely 2 minutes    Stand to Sit  Sits safely with minimal use of  hands    Transfers  Able to transfer safely, minor use of hands    Standing Unsupported with Eyes Closed  Able to stand 10 seconds safely    Standing Ubsupported with Feet Together  Able to place feet together independently and stand 1 minute safely    From Standing, Reach Forward with Outstretched Arm  Can reach confidently >25 cm (10")    From Standing Position, Pick up Object from Floor  Able to pick up shoe safely and easily    From Standing Position, Turn to Look Behind Over each Shoulder  Looks behind from both sides and weight shifts well    Turn 360 Degrees  Able to turn 360 degrees safely in 4 seconds or  less    Standing Unsupported, Alternately Place Feet on Step/Stool  Able to stand independently and safely and complete 8 steps in 20 seconds    Standing Unsupported, One Foot in Front  Able to plae foot ahead of the other independently and hold 30 seconds    Standing on One Leg  Tries to lift leg/unable to hold 3 seconds but remains standing independently    Total Score  52    Berg comment:  Initial Berg 22/56      Functional Gait  Assessment   Gait assessed   Yes    Gait Level Surface  Walks 20 ft in less than 5.5 sec, no assistive devices, good speed, no evidence for imbalance, normal gait pattern, deviates no more than 6 in outside of the 12 in walkway width.    Change in Gait Speed  Able to smoothly change walking speed without loss of balance or gait deviation. Deviate no more than 6 in outside of the 12 in walkway width.    Gait with Horizontal Head Turns  Performs head turns smoothly with no change in gait. Deviates no more than 6 in outside 12 in walkway width    Gait with Vertical Head Turns  Performs head turns with no change in gait. Deviates no more than 6 in outside 12 in walkway width.    Gait and Pivot Turn  Pivot turns safely within 3 sec and stops quickly with no loss of balance.    Step Over Obstacle  Is able to step over 2 stacked shoe boxes taped together (9 in  total height) without changing gait speed. No evidence of imbalance.    Gait with Narrow Base of Support  Ambulates less than 4 steps heel to toe or cannot perform without assistance.    Gait with Eyes Closed  Walks 20 ft, uses assistive device, slower speed, mild gait deviations, deviates 6-10 in outside 12 in walkway width. Ambulates 20 ft in less than 9 sec but greater than 7 sec.    Ambulating Backwards  Walks 20 ft, no assistive devices, good speed, no evidence for imbalance, normal gait    Steps  Alternating feet, must use rail.    Total Score  25      Prosthetics Assessment - 02/10/18 1445      Prosthetics   Prosthetic Care Independent with  Skin check;Residual limb care;Care of non-amputated limb;Prosthetic cleaning;Ply sock cleaning;Correct ply sock adjustment;Proper wear schedule/adjustment;Proper weight-bearing schedule/adjustment    Donning prosthesis   Independent    Doffing prosthesis   Independent    Current prosthetic wear tolerance (days/week)   daily    Current prosthetic wear tolerance (#hours/day)   all awake hours    Current prosthetic weight-bearing tolerance (hours/day)   Patient tolerates >20 minutes of standing activities without pain or discomfort    Edema  none    Residual limb condition   No open areas, normal color, temperature & moisture. cylinderical shape    K code/activity level with prosthetic use   K3 full community with variable cadence                  [facility] Adult PT Treatment/Exercise - 02/10/18 1445      Therapeutic Activites    Therapeutic Activities  Other Therapeutic Activities;Lifting;Work Web designer  Patient lifts & carries 25# box with verbal correction for foot position with initial rep. Pt performed 6 reps lifting 25# box and carry 100'  Work Radiation protection practitioner safe technique to climb A-frame ladder and perform simulated 2-handed task.     Other Therapeutic Activities  PT demo, instructed in push/pull type activities  with wt shift between feet and proper position. Pt return demo understanding.       Prosthetics   Donning Prosthesis  Independent    Doffing Prosthesis  Independent               PT Short Term Goals - 01/28/18 1542      PT SHORT TERM GOAL #1   Title  Patient verbalizes proper cleaning of prosthesis and demonstrates proper donning. (All STGs Target Date: 02/05/2018)    Time  4    Period  Weeks    Status  Achieved      PT SHORT TERM GOAL #2   Title  Patient tolerates prosthesis wear >10 hrs /day without skin issues.    Time  4    Period  Weeks    Status  Achieved      PT SHORT TERM GOAL #3   Title  Patient ambulates 500' and negotiates ramps, curbs and stairs (step-to pattern) with prosthesis & RW modified indpendent.     Baseline  met without RW    Time  4    Period  Weeks    Status  Achieved      PT SHORT TERM GOAL #4   Title  Patient ambulates 200' negotiating around obstacles and scanning environment with minA.     Baseline  S level     Time  4    Period  Weeks    Status  Achieved      PT SHORT TERM GOAL #5   Title  Patient picks up objects from floor without UE support with supervision.     Time  4    Period  Weeks    Status  Achieved        PT Long Term Goals - 02/10/18 1800      PT LONG TERM GOAL #1   Title  Patient verbalizes & demonstrates proper prosthetic care to enable safe function with prosthesis. (All LTGs Target Date 03/05/2018)    Baseline  MET 02/10/2018    Time  8    Period  Weeks    Status  Achieved      PT LONG TERM GOAL #2   Title  Patient tolerates wear of prosthesis >90% of awake hours without skin issues or limb pain to enable function during his day.     Baseline  MET 02/10/2018    Time  8    Period  Weeks    Status  Achieved      PT LONG TERM GOAL #3   Title  Berg Balance >52/56 to indicate lower fall risk.     Baseline  MET 02/10/2018   Berg Balance 53/56    Time  8    Period  Weeks    Status  Achieved      PT LONG  TERM GOAL #4   Title  Patient ambulates 1000' outdoors including grass, ramps & curbs with prosthesis only to enable community mobility.     Baseline  MET 02/10/2018    Time  8    Status  Achieved      PT LONG TERM GOAL #5   Title  Patient lifts & carries 25# box with prosthesis only modified independent.     Baseline  MET 02/10/2018  Time  8    Period  Weeks    Status  Achieved      PT LONG TERM GOAL #6   Title  Pt will improve FGA to >/=24/30 in order to indicate decreased fall risk.     Baseline  MET 02/10/2018  FGA 25/30    Time  8    Period  Weeks    Status  Achieved            Plan - 02/10/18 1800    Clinical Impression Statement  Patient met all LTGs. He is safely functioning with prosthesis only at full community level. He improved Berg Balance score to 53/56 and Functional Gait Assessment to 25/30. Both scores indicate low fall risk. Patient was able to perform work simulation skills which was his goal.     Rehab Potential  Good    PT Frequency  2x / week   hold until 01/12/2018 when new insurance starts per pt request   PT Duration  8 weeks   hold until 01/12/2018 when new insurance starts per pt request   PT Treatment/Interventions  ADLs/Self Care Home Management;Canalith Repostioning;DME Instruction;Gait training;Stair training;Functional mobility training;Therapeutic activities;Therapeutic exercise;Balance training;Neuromuscular re-education;Patient/family education;Prosthetic Training;Manual techniques;Vestibular    PT Next Visit Plan  discharge PT    Consulted and Agree with Plan of Care  Patient       Patient will benefit from skilled therapeutic intervention in order to improve the following deficits and impairments:  Abnormal gait, Decreased activity tolerance, Decreased balance, Decreased endurance, Decreased knowledge of use of DME, Decreased mobility, Decreased range of motion, Dizziness, Impaired flexibility, Postural dysfunction, Prosthetic  Dependency  Visit Diagnosis: Other abnormalities of gait and mobility  Unsteadiness on feet  Muscle weakness (generalized)  Abnormal posture     Problem List Patient Active Problem List   Diagnosis Date Noted  . Abnormality of gait 12/02/2017  . Labile blood pressure   . Labile blood glucose   . Amputation of left lower extremity below knee ([place]) 09/09/2017  . Benign prostatic hyperplasia   . Hyperlipidemia   . Stage 3 chronic kidney disease ([place])   . Neuropathic pain   . Osteomyelitis of left foot ([place])   . Coronary artery disease involving native coronary artery of native heart without angina pectoris   . Benign essential HTN   . Anemia of chronic disease   . Leukocytosis   . Pressure injury of skin 08/24/2017  . Cervical spondylosis with myelopathy and radiculopathy 05/07/2017  . ARF (acute renal failure) ([place]) 04/30/2017  . Upper respiratory infection 04/29/2017  . Fatty liver 04/04/2017  . DDD (degenerative disc disease), cervical 01/13/2017  . PAD (peripheral artery disease) ([place]) 08/07/2016  . Ulcer of ankle ([place]) 08/07/2016  . Chronic diarrhea 05/17/2016  . Hyponatremia 05/17/2016  . Anemia 01/12/2014  . Disorder of ligament of ankle 07/31/2013  . Edema 06/28/2013  . 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 Obesity (BMI 30-39.9) 06/12/2013  . Pain in joint, shoulder region 06/12/2013  . Statin intolerance 06/10/2013  . DM (diabetes mellitus), type 2, uncontrolled w/ophthalmic complication ([place]) 99/37/1696  . Hyperlipidemia associated with type 2 diabetes mellitus ([place]) 07/17/2006  . GILBERT'S SYNDROME 07/17/2006  . Essential hypertension 07/17/2006  . Coronary atherosclerosis 07/17/2006  . OSTEOARTHRITIS 07/17/2006  . COUGH 07/17/2006    WALDRON,ROBIN PT, DPT 02/11/2018, 12:57 PM  [facility] [address] [place], [place], [address] Phone: [phone]   Fax:   [phone]  Name: Chase Clark MRN: [id number] Date of Birth: [birth year]

## 2018-02-15 ENCOUNTER — Encounter: Payer: BLUE CROSS/BLUE SHIELD | Admitting: Physical Therapy

## 2018-02-16 ENCOUNTER — Encounter: Payer: Self-pay | Admitting: Internal Medicine

## 2018-02-16 ENCOUNTER — Ambulatory Visit: Payer: 59 | Admitting: Internal Medicine

## 2018-02-16 VITALS — BP 118/70 | HR 62 | Temp 98.5°F | Ht 70.0 in | Wt 220.6 lb

## 2018-02-16 DIAGNOSIS — S91009D Unspecified open wound, unspecified ankle, subsequent encounter: Secondary | ICD-10-CM | POA: Diagnosis not present

## 2018-02-16 DIAGNOSIS — S81809D Unspecified open wound, unspecified lower leg, subsequent encounter: Secondary | ICD-10-CM

## 2018-02-16 DIAGNOSIS — L03116 Cellulitis of left lower limb: Secondary | ICD-10-CM | POA: Diagnosis not present

## 2018-02-16 DIAGNOSIS — Z89512 Acquired absence of left leg below knee: Secondary | ICD-10-CM | POA: Diagnosis not present

## 2018-02-16 DIAGNOSIS — S81009D Unspecified open wound, unspecified knee, subsequent encounter: Secondary | ICD-10-CM

## 2018-02-16 DIAGNOSIS — S86909D Unspecified injury of unspecified muscle(s) and tendon(s) at lower leg level, unspecified leg, subsequent encounter: Secondary | ICD-10-CM

## 2018-02-16 DIAGNOSIS — Z23 Encounter for immunization: Secondary | ICD-10-CM

## 2018-02-16 DIAGNOSIS — Z4781 Encounter for orthopedic aftercare following surgical amputation: Secondary | ICD-10-CM | POA: Diagnosis not present

## 2018-02-16 DIAGNOSIS — E1310 Other specified diabetes mellitus with ketoacidosis without coma: Secondary | ICD-10-CM | POA: Diagnosis not present

## 2018-02-16 MED ORDER — SULFAMETHOXAZOLE-TRIMETHOPRIM 800-160 MG PO TABS: 1.0000 | ORAL_TABLET | Freq: Two times a day (BID) | ORAL | 0 refills | Status: DC

## 2018-02-16 MED FILL — SULFAMETHOXAZOLE-TMP DS TAB: 800-160 | 10 days supply | Qty: 20 | Fill #0

## 2018-02-16 NOTE — Addendum Note (Signed)
Addended by: Myriam Forehand on: 02/16/2018 12:25 PM   Modules accepted: Orders

## 2018-02-16 NOTE — Progress Notes (Signed)
Chief Complaint  Patient presents with  . Wound Check    Noticed yesterday. Pt stated that he has been doing a lot of walking with his artificial leg prosthesis and he beleives that he has just rubbed his skin raw  now has an open wound and redness aound the open wound. Pt stated that his wife noticed this yesterday. Pt has been applying bactroban to his leg.    F/u  1. Open wound left knee BKA x 3 days draining clear drainage and red surround used bactroban 3x so far but wound not getting better thinks from prosthetic rubbing no fever   Of note surgeon who did surgery was Dr. [facility] and he does not want to go back to wound clinic in [place] if this is not healing after treatment    Review of Systems  Constitutional: Negative for weight loss.  HENT: Negative for hearing loss.   Eyes: Negative for blurred vision.  Cardiovascular: Negative for chest pain.  Skin: Negative for rash.       Open wound left knee   Neurological: Negative for headaches.  Psychiatric/Behavioral: Negative for depression.   Past Medical History:  Diagnosis Date  . Arthritis   . Asthma    as child  . Coronary artery disease    DES DIAG1 11/20/09 [facility])  . Diabetes mellitus without complication ([place])   . Diabetic osteomyelitis ([place])   . Diabetic ulcer of foot with muscle involvement without evidence of necrosis ([place])    DIABETIC ULCERATIONS ASSOCIATED WITH IRRITATION LATERAL ANKLE LEFT GREATER THAN RIGHT WITH MILD CELLULITIS   . Diverticulosis   . DKA (diabetic ketoacidoses) ([place]) 08/23/2017  . Edema, lower extremity   . Fatty liver   . GERD (gastroesophageal reflux disease)   . Gilbert's disease   . Glaucoma   . Heart disease 2011   Patient has stent for 80% blockage  . Hyperlipidemia   . Hypertension   . Neuropathy   . Seasonal allergies   . Shoulder pain   . Ulcer of foot ([place]) 08.06.2014   DIABETIC ULCERATIONS ASSOCIATED WITH IRRITATION LATERAL ANKLE LEFT GREATER THAN RIGHT  WITH MILD CELLULITIS   Past Surgical History:  Procedure Laterality Date  . ABDOMINAL AORTOGRAM N/A 05/20/2016   Procedure: Abdominal Aortogram possible intervention;  Surgeon: Katha Cabal, MD;  Location: [place];  Service: Cardiovascular;  Laterality: N/A;  . AMPUTATION Left 09/05/2017   Procedure: AMPUTATION BELOW KNEE;  Surgeon: Tania Ade, MD;  Location: [place];  Service: Orthopedics;  Laterality: Left;  . ANTERIOR CERVICAL DECOMP/DISCECTOMY FUSION N/A 05/07/2017   Procedure: ANTERIOR CERVICAL DECOMPRESSION/DISCECTOMY Luevenia Maxin PROSTHESIS,PLATE/SCREWS CERVICAL FIVE - CERVICAL SIX;  Surgeon: Newman Pies, MD;  Location: [facility];  Service: Neurosurgery;  Laterality: N/A;  . CARDIAC CATHETERIZATION    . CATARACT EXTRACTION W/ INTRAOCULAR LENS IMPLANT    . CATARACT EXTRACTION W/PHACO Left 02/26/2017   Procedure: CATARACT EXTRACTION PHACO AND INTRAOCULAR LENS PLACEMENT (IOC);  Surgeon: Eulogio Bear, MD;  Location: [facility];  Service: Ophthalmology;  Laterality: Left;  Lot # [id number] H [place]: 00:25.3 AP%: 6.2 CDE: 1.59   . CHOLECYSTECTOMY N/A   . CORONARY ANGIOPLASTY WITH STENT PLACEMENT  2007   1st diagonal  . EPIBLEPHERON REPAIR WITH TEAR DUCT PROBING    . EYE SURGERY Right sept 29n2015   cataract extraction  . INSERTION EXPRESS TUBE SHUNT Right 09/06/2015   Procedure: INSERTION AHMED TUBE SHUNT with tutoplast allograft;  Surgeon: Eulogio Bear, MD;  Location: [facility];  Service: Ophthalmology;  Laterality: Right;  Marland Kitchen VASECTOMY     Family History  Problem Relation Age of Onset  . Hyperlipidemia Mother   . Diabetes Mother   . Liver disease Mother        NASH  . Hyperlipidemia Father   . Diabetes Father   . Heart disease Father   . Hypertension Father    Social History   Socioeconomic History  . Marital status: Married    Spouse name: Not on file  . Number of children: Not on file  . Years of education: Not on file  . Highest education  level: Not on file  Occupational History  . Not on file  Social Needs  . Financial resource strain: Not on file  . Food insecurity:    Worry: Not on file    Inability: Not on file  . Transportation needs:    Medical: Not on file    Non-medical: Not on file  Tobacco Use  . Smoking status: Former Smoker    Types: Cigarettes    Last attempt to quit: 03/14/2008    Years since quitting: 9.9  . Smokeless tobacco: Never Used  Substance and Sexual Activity  . Alcohol use: Yes    Comment: OCCAS  . Drug use: No  . Sexual activity: Not on file  Lifestyle  . Physical activity:    Days per week: Not on file    Minutes per session: Not on file  . Stress: Not on file  Relationships  . Social connections:    Talks on phone: Not on file    Gets together: Not on file    Attends religious service: Not on file    Active member of club or organization: Not on file    Attends meetings of clubs or organizations: Not on file    Relationship status: Not on file  . Intimate partner violence:    Fear of current or ex partner: Not on file    Emotionally abused: Not on file    Physically abused: Not on file    Forced sexual activity: Not on file  Other Topics Concern  . Not on file  Social History Narrative   Used to work as paramedic    Current Meds  Medication Sig  . acetaminophen (TYLENOL) 325 MG tablet Take 1 tablet (325 mg total) by mouth every 6 (six) hours as needed for mild pain (pain score 1-3 or temp > 100.5).  Marland Kitchen amLODipine (NORVASC) 10 MG tablet Take 1 tablet (10 mg total) by mouth daily.  Marland Kitchen aspirin EC 81 MG tablet Take 81 mg by mouth daily.  . brimonidine-timolol (COMBIGAN) 0.2-0.5 % ophthalmic solution Place 1 drop into the right eye every 12 (twelve) hours.   . DUREZOL 0.05 % EMUL Place 1 drop into the left eye at bedtime  . esomeprazole (NEXIUM) 40 MG capsule Take 1 capsule (40 mg total) by mouth daily.  Marland Kitchen gabapentin (NEURONTIN) 300 MG capsule Take 2 capsules (600 mg total) by  mouth 2 (two) times daily.  . insulin glargine (LANTUS) 100 unit/mL SOPN Inject 1.2 mLs (120 Units total) into the skin every morning.  . insulin lispro (HUMALOG KWIKPEN) 100 UNIT/ML KiwkPen Inject 0.25 mLs (25 Units total) into the skin 3 (three) times daily with meals. And pen needles 4/day  . latanoprost (XALATAN) 0.005 % ophthalmic solution Place 1 drop into both eyes at bedtime.  Marland Kitchen losartan-hydrochlorothiazide (HYZAAR) 100-25 MG tablet Take by mouth.  Marland Kitchen  metoprolol tartrate (LOPRESSOR) 100 MG tablet Take 0.5 tablets (50 mg total) by mouth 2 (two) times daily.  . mupirocin ointment (BACTROBAN) 2 % Apply 1 application topically 3 (three) times daily. Right leg  . oxyCODONE (OXY IR/ROXICODONE) 5 MG immediate release tablet Take 1 tablet (5 mg total) by mouth every 6 (six) hours as needed for severe pain (pain score 7-10).  . rosuvastatin (CRESTOR) 10 MG tablet Take 1 tablet (10 mg total) by mouth daily.  . [DISCONTINUED] bethanechol (URECHOLINE) 50 MG tablet 50 mg 3 times daily x1 week and 50 mg twice daily x1 week then 50 mg daily x1 week and stop  . [DISCONTINUED] polyethylene glycol (MIRALAX / GLYCOLAX) packet Take 17 g by mouth daily.  . [DISCONTINUED] sulfamethoxazole-trimethoprim (BACTRIM DS,SEPTRA DS) 800-160 MG tablet Take 1 tablet by mouth 2 (two) times daily. With food  . [DISCONTINUED] Sulfamethoxazole-Trimethoprim (BACTRIM PO) Take by mouth.  . [DISCONTINUED] tamsulosin (FLOMAX) 0.4 MG CAPS capsule Take 2 capsules (0.8 mg total) by mouth daily.   Allergies  Allergen Reactions  . Atorvastatin Other (See Comments)    Muscle aches    Recent Results (from the past 2160 hour(s))  POCT glycosylated hemoglobin (Hb A1C)     Status: Abnormal   Collection Time: 12/02/17  3:55 PM  Result Value Ref Range   Hemoglobin A1C 10.1 (A) 4.0 - 5.6 %   HbA1c POC (<> result, manual entry)     HbA1c, POC (prediabetic range)     HbA1c, POC (controlled diabetic range)    TSH     Status: None    Collection Time: 12/02/17  4:38 PM  Result Value Ref Range   TSH 3.13 0.35 - 4.50 uIU/mL  POCT HgB A1C     Status: Abnormal   Collection Time: 02/01/18  9:58 AM  Result Value Ref Range   Hemoglobin A1C 10.8 (A) 4.0 - 5.6 %   HbA1c POC (<> result, manual entry)     HbA1c, POC (prediabetic range)     HbA1c, POC (controlled diabetic range)     Objective  Body mass index is 31.65 kg/m. Wt Readings from Last 3 Encounters:  02/16/18 220 lb 9.6 oz (100.1 kg)  02/01/18 221 lb (100.2 kg)  12/28/17 229 lb (103.9 kg)   Temp Readings from Last 3 Encounters:  02/16/18 98.5 F (36.9 C) (Oral)  12/28/17 98.3 F (36.8 C) (Oral)  09/30/17 97.8 F (36.6 C) (Oral)   BP Readings from Last 3 Encounters:  02/16/18 118/70  02/01/18 (!) 142/78  12/28/17 110/72   Pulse Readings from Last 3 Encounters:  02/16/18 62  02/01/18 72  12/28/17 68    Physical Exam  Constitutional: He is oriented to person, place, and time. Vital signs are normal. He appears well-developed and well-nourished. He is cooperative.  HENT:  Head: Normocephalic and atraumatic.  Mouth/Throat: Oropharynx is clear and moist and mucous membranes are normal.  Eyes: Pupils are equal, round, and reactive to light. Conjunctivae are normal.  Cardiovascular: Normal rate, regular rhythm and normal heart sounds.  Pulmonary/Chest: Effort normal and breath sounds normal.  Neurological: He is alert and oriented to person, place, and time. Gait normal.  Skin: Skin is warm and dry.     Psychiatric: He has a normal mood and affect. His speech is normal and behavior is normal. Judgment and thought content normal. Cognition and memory are normal.  Nursing note and vitals reviewed.   Assessment   1. Left BKA with elliptical wound 1.5 cm  with cellulitis  Plan  1. Bactrim bid x 10 days with food  bactroban 2-3 x per day  Antibacterial soap  If not better refer back to Dr. [name] ortho pt prefers not to go to wound clinic in [place] or wound clinic  Could consider wound clinic in [place] Dr. Lyndee Leo [facility]   Flu shot given today high dose  Provider: Dr. Olivia Mackie [facility]

## 2018-02-16 NOTE — Patient Instructions (Addendum)
Call back if not getting better  Bactroban use 2-3 x per day  Clean 2x per day with antibacterial soap (Dial, Cetaphil bar soap, or Hibiclens)  If not better will refer to surgery   Cellulitis, Adult Cellulitis is a skin infection. The infected area is usually red and tender. This condition occurs most often in the arms and lower legs. The infection can travel to the muscles, blood, and underlying tissue and become serious. It is very important to get treated for this condition. What are the causes? Cellulitis is caused by bacteria. The bacteria enter through a break in the skin, such as a cut, burn, insect bite, open sore, or crack. What increases the risk? This condition is more likely to occur in people who:  Have a weak defense system (immune system).  Have open wounds on the skin such as cuts, burns, bites, and scrapes. Bacteria can enter the body through these open wounds.  Are older.  Have diabetes.  Have a type of long-lasting (chronic) liver disease (cirrhosis) or kidney disease.  Use IV drugs.  What are the signs or symptoms? Symptoms of this condition include:  Redness, streaking, or spotting on the skin.  Swollen area of the skin.  Tenderness or pain when an area of the skin is touched.  Warm skin.  Fever.  Chills.  Blisters.  How is this diagnosed? This condition is diagnosed based on a medical history and physical exam. You may also have tests, including:  Blood tests.  Lab tests.  Imaging tests.  How is this treated? Treatment for this condition may include:  Medicines, such as antibiotic medicines or antihistamines.  Supportive care, such as rest and application of cold or warm cloths (cold or warm compresses) to the skin.  Hospital care, if the condition is severe.  The infection usually gets better within 1-2 days of treatment. Follow these instructions at home:  Take over-the-counter and prescription medicines only as told by your  health care provider.  If you were prescribed an antibiotic medicine, take it as told by your health care provider. Do not stop taking the antibiotic even if you start to feel better.  Drink enough fluid to keep your urine clear or pale yellow.  Do not touch or rub the infected area.  Raise (elevate) the infected area above the level of your heart while you are sitting or lying down.  Apply warm or cold compresses to the affected area as told by your health care provider.  Keep all follow-up visits as told by your health care provider. This is important. These visits let your health care provider make sure a more serious infection is not developing. Contact a health care provider if:  You have a fever.  Your symptoms do not improve within 1-2 days of starting treatment.  Your bone or joint underneath the infected area becomes painful after the skin has healed.  Your infection returns in the same area or another area.  You notice a swollen bump in the infected area.  You develop new symptoms.  You have a general ill feeling (malaise) with muscle aches and pains. Get help right away if:  Your symptoms get worse.  You feel very sleepy.  You develop vomiting or diarrhea that persists.  You notice red streaks coming from the infected area.  Your red area gets larger or turns dark in color. This information is not intended to replace advice given to you by your health care provider. Make sure you  discuss any questions you have with your health care provider. Document Released: 01/08/2005 Document Revised: 08/09/2015 Document Reviewed: 02/07/2015 Elsevier Interactive Patient Education  2018 [place], Adult Taking care of your wound properly can help to prevent pain and infection. It can also help your wound to heal more quickly. How is this treated? Wound care  Follow instructions from your health care provider about how to take care of your wound. Make sure  you: ? Wash your hands with soap and water before you change the bandage (dressing). If soap and water are not available, use hand sanitizer. ? Change your dressing as told by your health care provider. ? Leave stitches (sutures), skin glue, or adhesive strips in place. These skin closures may need to stay in place for 2 weeks or longer. If adhesive strip edges start to loosen and curl up, you may trim the loose edges. Do not remove adhesive strips completely unless your health care provider tells you to do that.  Check your wound area every day for signs of infection. Check for: ? More redness, swelling, or pain. ? More fluid or blood. ? Warmth. ? Pus or a bad smell.  Ask your health care provider if you should clean the wound with mild soap and water. Doing this may include: ? Using a clean towel to pat the wound dry after cleaning it. Do not rub or scrub the wound. ? Applying a cream or ointment. Do this only as told by your health care provider. ? Covering the incision with a clean dressing.  Ask your health care provider when you can leave the wound uncovered. Medicines   If you were prescribed an antibiotic medicine, cream, or ointment, take or use the antibiotic as told by your health care provider. Do not stop taking or using the antibiotic even if your condition improves.  Take over-the-counter and prescription medicines only as told by your health care provider. If you were prescribed pain medicine, take it at least 30 minutes before doing any wound care or as told by your health care provider. General instructions  Return to your normal activities as told by your health care provider. Ask your health care provider what activities are safe.  Do not scratch or pick at the wound.  Keep all follow-up visits as told by your health care provider. This is important.  Eat a diet that includes protein, vitamin A, vitamin C, and other nutrient-rich foods. These help the wound  heal: ? Protein-rich foods include meat, dairy, beans, nuts, and other sources. ? Vitamin A-rich foods include carrots and dark green, leafy vegetables. ? Vitamin C-rich foods include citrus, tomatoes, and other fruits and vegetables. ? Nutrient-rich foods have protein, carbohydrates, fat, vitamins, or minerals. Eat a variety of healthy foods including vegetables, fruits, and whole grains. Contact a health care provider if:  You received a tetanus shot and you have swelling, severe pain, redness, or bleeding at the injection site.  Your pain is not controlled with medicine.  You have more redness, swelling, or pain around the wound.  You have more fluid or blood coming from the wound.  Your wound feels warm to the touch.  You have pus or a bad smell coming from the wound.  You have a fever or chills.  You are nauseous or you vomit.  You are dizzy. Get help right away if:  You have a red streak going away from your wound.  The edges of the wound open  up and separate.  Your wound is bleeding and the bleeding does not stop with gentle pressure.  You have a rash.  You faint.  You have trouble breathing. This information is not intended to replace advice given to you by your health care provider. Make sure you discuss any questions you have with your health care provider. Document Released: 01/08/2008 Document Revised: 11/28/2015 Document Reviewed: 10/16/2015 Elsevier Interactive Patient Education  2017 [employer]   Cellulitis, Adult Cellulitis is a skin infection. The infected area is usually red and tender. This condition occurs most often in the arms and lower legs. The infection can travel to the muscles, blood, and underlying tissue and become serious. It is very important to get treated for this condition. What are the causes? Cellulitis is caused by bacteria. The bacteria enter through a break in the skin, such as a cut, burn, insect bite, open sore, or crack. What  increases the risk? This condition is more likely to occur in people who:  Have a weak defense system (immune system).  Have open wounds on the skin such as cuts, burns, bites, and scrapes. Bacteria can enter the body through these open wounds.  Are older.  Have diabetes.  Have a type of long-lasting (chronic) liver disease (cirrhosis) or kidney disease.  Use IV drugs.  What are the signs or symptoms? Symptoms of this condition include:  Redness, streaking, or spotting on the skin.  Swollen area of the skin.  Tenderness or pain when an area of the skin is touched.  Warm skin.  Fever.  Chills.  Blisters.  How is this diagnosed? This condition is diagnosed based on a medical history and physical exam. You may also have tests, including:  Blood tests.  Lab tests.  Imaging tests.  How is this treated? Treatment for this condition may include:  Medicines, such as antibiotic medicines or antihistamines.  Supportive care, such as rest and application of cold or warm cloths (cold or warm compresses) to the skin.  Hospital care, if the condition is severe.  The infection usually gets better within 1-2 days of treatment. Follow these instructions at home:  Take over-the-counter and prescription medicines only as told by your health care provider.  If you were prescribed an antibiotic medicine, take it as told by your health care provider. Do not stop taking the antibiotic even if you start to feel better.  Drink enough fluid to keep your urine clear or pale yellow.  Do not touch or rub the infected area.  Raise (elevate) the infected area above the level of your heart while you are sitting or lying down.  Apply warm or cold compresses to the affected area as told by your health care provider.  Keep all follow-up visits as told by your health care provider. This is important. These visits let your health care provider make sure a more serious infection is not  developing. Contact a health care provider if:  You have a fever.  Your symptoms do not improve within 1-2 days of starting treatment.  Your bone or joint underneath the infected area becomes painful after the skin has healed.  Your infection returns in the same area or another area.  You notice a swollen bump in the infected area.  You develop new symptoms.  You have a general ill feeling (malaise) with muscle aches and pains. Get help right away if:  Your symptoms get worse.  You feel very sleepy.  You develop vomiting or diarrhea  that persists.  You notice red streaks coming from the infected area.  Your red area gets larger or turns dark in color. This information is not intended to replace advice given to you by your health care provider. Make sure you discuss any questions you have with your health care provider. Document Released: 01/08/2005 Document Revised: 08/09/2015 Document Reviewed: 02/07/2015 Elsevier Interactive Patient Education  [employer].

## 2018-02-17 ENCOUNTER — Encounter: Payer: BLUE CROSS/BLUE SHIELD | Admitting: Physical Therapy

## 2018-02-22 ENCOUNTER — Encounter: Payer: BLUE CROSS/BLUE SHIELD | Admitting: Physical Therapy

## 2018-02-24 ENCOUNTER — Encounter: Payer: BLUE CROSS/BLUE SHIELD | Admitting: Physical Therapy

## 2018-03-01 ENCOUNTER — Encounter: Payer: BLUE CROSS/BLUE SHIELD | Admitting: Physical Therapy

## 2018-03-03 ENCOUNTER — Ambulatory Visit: Payer: 59 | Admitting: Physical Therapy

## 2018-03-04 ENCOUNTER — Encounter: Payer: Self-pay | Admitting: Physical Medicine & Rehabilitation

## 2018-03-04 ENCOUNTER — Encounter: Payer: 59 | Attending: Physical Medicine & Rehabilitation | Admitting: Physical Medicine & Rehabilitation

## 2018-03-04 VITALS — BP 133/71 | HR 62 | Ht 70.5 in | Wt 220.0 lb

## 2018-03-04 DIAGNOSIS — Z89512 Acquired absence of left leg below knee: Secondary | ICD-10-CM | POA: Insufficient documentation

## 2018-03-04 DIAGNOSIS — I2510 Atherosclerotic heart disease of native coronary artery without angina pectoris: Secondary | ICD-10-CM | POA: Diagnosis not present

## 2018-03-04 DIAGNOSIS — E1122 Type 2 diabetes mellitus with diabetic chronic kidney disease: Secondary | ICD-10-CM | POA: Diagnosis not present

## 2018-03-04 DIAGNOSIS — M79641 Pain in right hand: Secondary | ICD-10-CM

## 2018-03-04 DIAGNOSIS — E11621 Type 2 diabetes mellitus with foot ulcer: Secondary | ICD-10-CM | POA: Insufficient documentation

## 2018-03-04 DIAGNOSIS — E1139 Type 2 diabetes mellitus with other diabetic ophthalmic complication: Secondary | ICD-10-CM | POA: Diagnosis not present

## 2018-03-04 DIAGNOSIS — I10 Essential (primary) hypertension: Secondary | ICD-10-CM | POA: Diagnosis not present

## 2018-03-04 DIAGNOSIS — N183 Chronic kidney disease, stage 3 (moderate): Secondary | ICD-10-CM | POA: Diagnosis not present

## 2018-03-04 DIAGNOSIS — G546 Phantom limb syndrome with pain: Secondary | ICD-10-CM

## 2018-03-04 DIAGNOSIS — E1165 Type 2 diabetes mellitus with hyperglycemia: Secondary | ICD-10-CM | POA: Diagnosis not present

## 2018-03-04 DIAGNOSIS — IMO0002 Reserved for concepts with insufficient information to code with codable children: Secondary | ICD-10-CM

## 2018-03-04 DIAGNOSIS — S88112A Complete traumatic amputation at level between knee and ankle, left lower leg, initial encounter: Secondary | ICD-10-CM

## 2018-03-04 DIAGNOSIS — E1140 Type 2 diabetes mellitus with diabetic neuropathy, unspecified: Secondary | ICD-10-CM | POA: Diagnosis not present

## 2018-03-04 DIAGNOSIS — M7989 Other specified soft tissue disorders: Secondary | ICD-10-CM | POA: Insufficient documentation

## 2018-03-04 DIAGNOSIS — R269 Unspecified abnormalities of gait and mobility: Secondary | ICD-10-CM | POA: Diagnosis not present

## 2018-03-04 NOTE — Progress Notes (Signed)
Subjective:    Patient ID: Chase Clark, male    DOB: [birth year], 52 y.o.   MRN: [id number]  HPI 52 year old right-handed male with a history of CAD with stenting, diabetes mellitus, CKD stage III presents for follow up for left BKA.   Last clinic visit 12/02/17. Since that time, he went to the physician for a blisters on his stump and was given abx.  He completed therapies.  He continues to have intermittent phantom limb pain.  He has good benefits with Neurontin. He is following with Endo.  Pain Inventory Average Pain 1 Pain Right Now 1 My pain is intermittent, stabbing and tingling  In the last 24 hours, has pain interfered with the following? General activity 4 Relation with others 0 Enjoyment of life 0 What TIME of day is your pain at its worst? evening Sleep (in general) Good  Pain is worse with: some activites Pain improves with: rest Relief from Meds: no pain meds  Mobility walk without assistance  Function not employed: date last employed .  Neuro/Psych No problems in this area  Prior Studies Any changes since last visit?  no  Physicians involved in your care Any changes since last visit?  no   Family History  Problem Relation Age of Onset  . Hyperlipidemia Mother   . Diabetes Mother   . Liver disease Mother        NASH  . Hyperlipidemia Father   . Diabetes Father   . Heart disease Father   . Hypertension Father    Social History   Socioeconomic History  . Marital status: Married    Spouse name: Not on file  . Number of children: Not on file  . Years of education: Not on file  . Highest education level: Not on file  Occupational History  . Not on file  Social Needs  . Financial resource strain: Not on file  . Food insecurity:    Worry: Not on file    Inability: Not on file  . Transportation needs:    Medical: Not on file    Non-medical: Not on file  Tobacco Use  . Smoking status: Former Smoker    Types: Cigarettes    Last attempt  to quit: 03/14/2008    Years since quitting: 9.9  . Smokeless tobacco: Never Used  Substance and Sexual Activity  . Alcohol use: Yes    Comment: OCCAS  . Drug use: No  . Sexual activity: Not on file  Lifestyle  . Physical activity:    Days per week: Not on file    Minutes per session: Not on file  . Stress: Not on file  Relationships  . Social connections:    Talks on phone: Not on file    Gets together: Not on file    Attends religious service: Not on file    Active member of club or organization: Not on file    Attends meetings of clubs or organizations: Not on file    Relationship status: Not on file  Other Topics Concern  . Not on file  Social History Narrative   Used to work as paramedic    Married    Past Surgical History:  Procedure Laterality Date  . ABDOMINAL AORTOGRAM N/A 05/20/2016   Procedure: Abdominal Aortogram possible intervention;  Surgeon: Katha Cabal, MD;  Location: [facility];  Service: Cardiovascular;  Laterality: N/A;  . AMPUTATION Left 09/05/2017   Procedure: AMPUTATION BELOW KNEE;  Surgeon: [name],  Larkin Ina, MD;  Location: [facility];  Service: Orthopedics;  Laterality: Left;  . ANTERIOR CERVICAL DECOMP/DISCECTOMY FUSION N/A 05/07/2017   Procedure: ANTERIOR CERVICAL DECOMPRESSION/DISCECTOMY Luevenia Maxin PROSTHESIS,PLATE/SCREWS CERVICAL FIVE - CERVICAL SIX;  Surgeon: Newman Pies, MD;  Location: [facility];  Service: Neurosurgery;  Laterality: N/A;  . CARDIAC CATHETERIZATION    . CATARACT EXTRACTION W/ INTRAOCULAR LENS IMPLANT    . CATARACT EXTRACTION W/PHACO Left 02/26/2017   Procedure: CATARACT EXTRACTION PHACO AND INTRAOCULAR LENS PLACEMENT (IOC);  Surgeon: Eulogio Bear, MD;  Location: [facility];  Service: Ophthalmology;  Laterality: Left;  Lot # [id number] H [place]: 00:25.3 AP%: 6.2 CDE: 1.59   . CHOLECYSTECTOMY N/A   . CORONARY ANGIOPLASTY WITH STENT PLACEMENT  2007   1st diagonal  . EPIBLEPHERON REPAIR WITH TEAR DUCT PROBING    . EYE  SURGERY Right sept 29n2015   cataract extraction  . INSERTION EXPRESS TUBE SHUNT Right 09/06/2015   Procedure: INSERTION AHMED TUBE SHUNT with tutoplast allograft;  Surgeon: Eulogio Bear, MD;  Location: [facility];  Service: Ophthalmology;  Laterality: Right;  Marland Kitchen VASECTOMY     Past Medical History:  Diagnosis Date  . Arthritis   . Asthma    as child  . Coronary artery disease    DES DIAG1 11/20/09 [facility])  . Diabetes mellitus without complication ([place])   . Diabetic osteomyelitis ([place])   . Diabetic ulcer of foot with muscle involvement without evidence of necrosis ([place])    DIABETIC ULCERATIONS ASSOCIATED WITH IRRITATION LATERAL ANKLE LEFT GREATER THAN RIGHT WITH MILD CELLULITIS   . Diverticulosis   . DKA (diabetic ketoacidoses) ([place]) 08/23/2017  . Edema, lower extremity   . Fatty liver   . GERD (gastroesophageal reflux disease)   . Gilbert's disease   . Glaucoma   . Heart disease 2011   Patient has stent for 80% blockage  . Hyperlipidemia   . Hypertension   . Neuropathy   . Seasonal allergies   . Shoulder pain   . Ulcer of foot ([place]) 08.06.2014   DIABETIC ULCERATIONS ASSOCIATED WITH IRRITATION LATERAL ANKLE LEFT GREATER THAN RIGHT WITH MILD CELLULITIS   BP 133/71   Pulse 62   Ht 5' 10.5" (1.791 m)   Wt 220 lb (99.8 kg)   SpO2 98%   BMI 31.12 kg/m   Opioid Risk Score:   Fall Risk Score:  `1  Depression screen PHQ 2/9  Depression screen [facility] 2/9 12/28/2017 09/25/2017 08/29/2016  Decreased Interest 0 0 1  Down, Depressed, Hopeless 0 0 0  PHQ - 2 Score 0 0 1  Altered sleeping - - 1  Tired, decreased energy - - 2  Change in appetite - - 0  Feeling bad or failure about yourself  - - 0  Trouble concentrating - - 0  Moving slowly or fidgety/restless - - 0  Suicidal thoughts - - 0  PHQ-9 Score - - 4    Review of Systems  Constitutional: Positive for unexpected weight change.  HENT: Negative.   Eyes: Negative.   Respiratory: Negative.   Cardiovascular: Positive for leg  swelling.  Gastrointestinal: Negative.   Endocrine: Negative.        High blood sugar  Musculoskeletal: Positive for gait problem.  Skin: Negative.   Allergic/Immunologic: Negative.   Hematological: Negative.   Psychiatric/Behavioral: Negative.   All other systems reviewed and are negative.     Objective:   Physical Exam Constitutional: He appears well-developed and well-nourished. NAD. HENT: Normocephalic and atraumatic.  Eyes: EOM are  normal. No discharge.  Cardiovascular: RRR. No JVD. Respiratory: Effort normal and breath sounds normal.   GI: Bowel sounds are normal. He exhibits no distension.  Musculoskeletal:  Left stump healed Atrophy of 1st DI on right  Neurological: He is alert.    Alert and oriented. Motor: B/l UE 5/5 proximal to distal RLE: 5/5 proximal to distal LLE: HF, KE 5/5   Skin: Skin is warm and dry.  BKA with 1 healing blisters Psychiatric: He has a normal mood and affect. His behavior is normal.     Assessment & Plan:  52 year old right-handed male with a history of CAD with stenting, diabetes mellitus, CKD stage III presents for follow up for left BKA.  1. Decreased functional mobility secondary to sepsis secondary to left diabetic foot ulcer status post left BKA 09/05/2017   Cont HEP  2. Pain Management/Phantom limb pain:   Cont meds  Cont Neurontin 300 BID  3. Right hand weakness  Multifactorial - hx of cervical C5-C6 fusion with history 5th digit fracture  NCS/EMG inconclusive per pt - will consider repeat  Pt to follow up with Ortho tendon repair  Will order OT

## 2018-03-18 DIAGNOSIS — Z89512 Acquired absence of left leg below knee: Secondary | ICD-10-CM | POA: Diagnosis not present

## 2018-03-18 DIAGNOSIS — Z4781 Encounter for orthopedic aftercare following surgical amputation: Secondary | ICD-10-CM | POA: Diagnosis not present

## 2018-03-18 DIAGNOSIS — E1310 Other specified diabetes mellitus with ketoacidosis without coma: Secondary | ICD-10-CM | POA: Diagnosis not present

## 2018-03-30 ENCOUNTER — Ambulatory Visit: Payer: 59 | Admitting: Endocrinology

## 2018-03-30 DIAGNOSIS — Z0289 Encounter for other administrative examinations: Secondary | ICD-10-CM

## 2018-04-12 MED FILL — HUMALOG 100 UNITS/ML KWIKPE: 100 | 27 days supply | Qty: 12 | Fill #0

## 2018-04-12 MED FILL — LANTUS SOLOSTAR 100 UNITS/M: 100 | 24 days supply | Qty: 9 | Fill #0

## 2018-04-12 MED FILL — ROSUVASTATIN CALCIUM 10 MG: 10 | 30 days supply | Qty: 30 | Fill #0

## 2018-04-12 MED FILL — AMLODIPINE BESYLATE 10 MG T: 10 | 30 days supply | Qty: 30 | Fill #0

## 2018-04-12 MED FILL — METOPROLOL TARTRATE 100 MG: 100 | 90 days supply | Qty: 90 | Fill #0

## 2018-04-12 MED FILL — GABAPENTIN 300 MG CAPSULE: 300 | 90 days supply | Qty: 360 | Fill #0

## 2018-04-18 DIAGNOSIS — E1310 Other specified diabetes mellitus with ketoacidosis without coma: Secondary | ICD-10-CM | POA: Diagnosis not present

## 2018-04-18 DIAGNOSIS — Z89512 Acquired absence of left leg below knee: Secondary | ICD-10-CM | POA: Diagnosis not present

## 2018-04-18 DIAGNOSIS — Z4781 Encounter for orthopedic aftercare following surgical amputation: Secondary | ICD-10-CM | POA: Diagnosis not present

## 2018-04-27 ENCOUNTER — Ambulatory Visit: Payer: Self-pay | Admitting: Family Medicine

## 2018-04-27 DIAGNOSIS — Z0289 Encounter for other administrative examinations: Secondary | ICD-10-CM

## 2018-05-05 ENCOUNTER — Encounter: Payer: Self-pay | Attending: Physical Medicine & Rehabilitation | Admitting: Physical Medicine & Rehabilitation

## 2018-05-05 DIAGNOSIS — I10 Essential (primary) hypertension: Secondary | ICD-10-CM | POA: Insufficient documentation

## 2018-05-05 DIAGNOSIS — I2510 Atherosclerotic heart disease of native coronary artery without angina pectoris: Secondary | ICD-10-CM | POA: Insufficient documentation

## 2018-05-05 DIAGNOSIS — R269 Unspecified abnormalities of gait and mobility: Secondary | ICD-10-CM | POA: Insufficient documentation

## 2018-05-05 DIAGNOSIS — E1165 Type 2 diabetes mellitus with hyperglycemia: Secondary | ICD-10-CM | POA: Insufficient documentation

## 2018-05-05 DIAGNOSIS — E1122 Type 2 diabetes mellitus with diabetic chronic kidney disease: Secondary | ICD-10-CM | POA: Insufficient documentation

## 2018-05-05 DIAGNOSIS — M7989 Other specified soft tissue disorders: Secondary | ICD-10-CM | POA: Insufficient documentation

## 2018-05-05 DIAGNOSIS — E11621 Type 2 diabetes mellitus with foot ulcer: Secondary | ICD-10-CM | POA: Insufficient documentation

## 2018-05-05 DIAGNOSIS — Z89512 Acquired absence of left leg below knee: Secondary | ICD-10-CM | POA: Insufficient documentation

## 2018-05-05 DIAGNOSIS — E1140 Type 2 diabetes mellitus with diabetic neuropathy, unspecified: Secondary | ICD-10-CM | POA: Insufficient documentation

## 2018-05-05 DIAGNOSIS — N183 Chronic kidney disease, stage 3 (moderate): Secondary | ICD-10-CM | POA: Insufficient documentation

## 2018-05-19 DIAGNOSIS — Z89512 Acquired absence of left leg below knee: Secondary | ICD-10-CM | POA: Diagnosis not present

## 2018-05-19 DIAGNOSIS — E1310 Other specified diabetes mellitus with ketoacidosis without coma: Secondary | ICD-10-CM | POA: Diagnosis not present

## 2018-05-19 DIAGNOSIS — Z4781 Encounter for orthopedic aftercare following surgical amputation: Secondary | ICD-10-CM | POA: Diagnosis not present

## 2018-06-30 ENCOUNTER — Telehealth: Payer: Self-pay

## 2018-06-30 NOTE — Telephone Encounter (Signed)
Copied from [place] [id number]. Topic: Appointment Scheduling - Scheduling Inquiry for Clinic >> Jun 30, 2018  4:26 PM Bea Graff, NT wrote: Reason for CRM: Pts wife calling to see if her husband can be worked in sometime for his 3 month follow-up in April. His insurance will not begin until 07/14/2018. Please advise wife.

## 2018-06-30 NOTE — Telephone Encounter (Signed)
Sent to PCP to advise when we could fit pt in for an appt.

## 2018-06-30 NOTE — Telephone Encounter (Signed)
4:30 on a Monday or a Wednesday.

## 2018-07-01 NOTE — Telephone Encounter (Signed)
Called pt wife and left a detailed VM advised wife to call back so we could schedule her husband for an appt in April. CRM created and sent to [facility] pool.

## 2018-07-02 NOTE — Telephone Encounter (Signed)
Called pt and left a VM to call back. CRM created and sent to [facility] pool. 

## 2018-07-02 NOTE — Telephone Encounter (Signed)
Appointment has been rescheduled per [facility]. Patient notified.

## 2018-07-12 ENCOUNTER — Telehealth: Payer: Self-pay

## 2018-07-12 NOTE — Telephone Encounter (Signed)
Copied from [place] [phone]. Topic: General - Other >> Jul 12, 2018 12:34 PM Ivar Drape wrote: Reason for CRM:   Patient rescheduled his 28mth f/u appt to 09/01/2018 due to not wanting to come out because of the Coronavirus.  He will send a list of the medications he will need refilled up until that date in his Mychart account.

## 2018-07-13 ENCOUNTER — Encounter: Payer: Self-pay | Admitting: Family Medicine

## 2018-07-14 ENCOUNTER — Ambulatory Visit: Payer: Self-pay | Admitting: Family Medicine

## 2018-07-14 NOTE — Telephone Encounter (Signed)
Last OV 02/16/2018 with [facility]  Last OV with PCP 09/30/2017   Next OV 09/01/2018   Gabapentinlast refilled 09/17/2017 disp 90 with 3 refills   Amlodipinelast refilled 09/17/2017 disp 30 with no refills   Rovuastatinlast refilled 09/17/2017 disp 30 with 3 refills   Metropolol Tartratelast refilled 11/24/2017 disp 90 with 1 refill   Pt is due for refills   Sent to PCP for approval if OK to send without Webex or phone visit

## 2018-07-15 NOTE — Telephone Encounter (Signed)
Patient should be seen for a webex visit. Please contact him to get this set up.

## 2018-07-19 ENCOUNTER — Ambulatory Visit (INDEPENDENT_AMBULATORY_CARE_PROVIDER_SITE_OTHER): Payer: 59 | Admitting: Family Medicine

## 2018-07-19 ENCOUNTER — Encounter: Payer: Self-pay | Admitting: Family Medicine

## 2018-07-19 ENCOUNTER — Other Ambulatory Visit: Payer: Self-pay

## 2018-07-19 ENCOUNTER — Telehealth: Payer: Self-pay | Admitting: Family Medicine

## 2018-07-19 DIAGNOSIS — E1139 Type 2 diabetes mellitus with other diabetic ophthalmic complication: Secondary | ICD-10-CM

## 2018-07-19 DIAGNOSIS — IMO0002 Reserved for concepts with insufficient information to code with codable children: Secondary | ICD-10-CM

## 2018-07-19 DIAGNOSIS — E1169 Type 2 diabetes mellitus with other specified complication: Secondary | ICD-10-CM

## 2018-07-19 DIAGNOSIS — I10 Essential (primary) hypertension: Secondary | ICD-10-CM

## 2018-07-19 DIAGNOSIS — E1165 Type 2 diabetes mellitus with hyperglycemia: Secondary | ICD-10-CM

## 2018-07-19 DIAGNOSIS — J309 Allergic rhinitis, unspecified: Secondary | ICD-10-CM | POA: Insufficient documentation

## 2018-07-19 DIAGNOSIS — E785 Hyperlipidemia, unspecified: Secondary | ICD-10-CM

## 2018-07-19 MED ORDER — GABAPENTIN 300 MG PO CAPS: 600.0000 mg | ORAL_CAPSULE | Freq: Two times a day (BID) | ORAL | 3 refills | Status: DC

## 2018-07-19 MED ORDER — METOPROLOL TARTRATE 100 MG PO TABS: 50.0000 mg | ORAL_TABLET | Freq: Two times a day (BID) | ORAL | 1 refills | Status: DC

## 2018-07-19 MED ORDER — AMLODIPINE BESYLATE 10 MG PO TABS: 10.0000 mg | ORAL_TABLET | Freq: Every day | ORAL | 3 refills | Status: DC

## 2018-07-19 MED ORDER — ROSUVASTATIN CALCIUM 10 MG PO TABS: 10.0000 mg | ORAL_TABLET | Freq: Every day | ORAL | 3 refills | Status: DC

## 2018-07-19 NOTE — Telephone Encounter (Signed)
Called and spoke with pt. Pt scheduled for lab work on 4/27 and 4 month follow up 8/8

## 2018-07-19 NOTE — Telephone Encounter (Signed)
Please call the patient and get him set up for lab work sometime in the next 1 to 2 weeks.  Please also get him set up for a follow-up in the office in 3 to 4 months.

## 2018-07-19 NOTE — Assessment & Plan Note (Signed)
Does seem to be uncontrolled.  We will have him come in for an A1c.  He will continue his current regimen and he will continue to see endocrinology.

## 2018-07-19 NOTE — Progress Notes (Signed)
Virtual Visit via Telephone Note  This visit type was conducted due to national recommendations for restrictions regarding the COVID-19 pandemic (e.g. social distancing).  This format is felt to be most appropriate for this patient at this time.  All issues noted in this document were discussed and addressed.  No physical exam was performed (except for noted visual exam findings with Video Visits).   I connected with Chase Clark on 07/19/18 at  9:00 AM EDT by telephone and verified that I am speaking with the correct person using two identifiers.   I discussed the limitations, risks, security and privacy concerns of performing an evaluation and management service by telephone and the availability of in person appointments. I also discussed with the patient that there may be a patient responsible charge related to this service. The patient expressed understanding and agreed to proceed.  The patient Chase Clark) was at home and I [name]) was in the office.   Interactive audio and video telecommunications were attempted between this provider and patient, however failed, due to patient having technical difficulties OR patient did not have access to video capability.  We continued and completed visit with audio only.   Reason for visit: Med check  History of Present Illness: Diabetes: Typically running 120-150 during the day.  Notes it is 1 80-200 in the morning.  He is taking Lantus 50 units twice daily.  He additionally is taking Humalog ranging from 10 units to 35 units 3 times daily with meals depending on how much she is eating.  No polyuria or polydipsia.  No hypoglycemia.  Hypertension: Typically running 128-132/70 8-84.  He believes he may only be on amlodipine and metoprolol and is unsure about the losartan and HCTZ.  No chest pain or shortness of breath.  He notes his prior edema has improved significantly.  Hyperlipidemia: Taking Crestor.  No right upper quadrant pain or  myalgias.  Allergic rhinitis: Patient notes for the last month or so he has had minimal rhinorrhea with postnasal drip.  He notes only very minimal cough that is triggered by the postnasal drip.  He notes his eyes are burning to some degree.  He notes this is consistent with his prior allergy symptoms.  No fevers, chills, or shortness of breath.  He has been sneezing some.  He has tried Zyrtec for this in the past.  He does note he traveled to [place] about a month ago.    Observations/Objective: No physical exam was completed as this was a telehealth visit.  Assessment and Plan: Essential hypertension Adequately controlled at home.  He will check and see what his current medications are that he has been taking at home and he will send [place] a MyChart message.  He will return for lab he will come in for lab work.  CMA will contact the patient to schedule this.  Hyperlipidemia associated with type 2 diabetes mellitus He will continue Crestor.  He will have a lipid panel with lab work.  DM (diabetes mellitus), type 2, uncontrolled w/ophthalmic complication Does seem to be uncontrolled.  We will have him come in for an A1c.  He will continue his current regimen and he will continue to see endocrinology.  Allergic rhinitis Symptoms are consistent with allergic rhinitis.  Discussed trialing Zyrtec or Flonase over-the-counter.  I discussed coronavirus social distancing protocols and reasons to contact us or seek medical attention.  Follow Up Instructions: Patient will follow-up in the office in 3 to 4 months.  CMA will contact him to schedule this and his lab work   I discussed the assessment and treatment plan with the patient. The patient was provided an opportunity to ask questions and all were answered. The patient agreed with the plan and demonstrated an understanding of the instructions.   The patient was advised to call back or seek an in-person evaluation if the symptoms worsen or if the  condition fails to improve as anticipated.  I provided 20 minutes of non-face-to-face time during this encounter.   [name], MD

## 2018-07-19 NOTE — Progress Notes (Signed)
RF the following    Gabapentinlast refilled 09/17/2017 disp 90 with 3 refills   Amlodipinelast refilled 09/17/2017 disp 30 with no refills   Rovuastatinlast refilled 09/17/2017 disp 30 with 3 refills   Metropolol Tartratelast refilled 11/24/2017 disp 90 with 1 refill

## 2018-07-19 NOTE — Assessment & Plan Note (Signed)
Symptoms are consistent with allergic rhinitis.  Discussed trialing Zyrtec or Flonase over-the-counter.

## 2018-07-19 NOTE — Assessment & Plan Note (Signed)
Adequately controlled at home.  He will check and see what his current medications are that he has been taking at home and he will send [place] a MyChart message.  He will return for lab he will come in for lab work.  CMA will contact the patient to schedule this.

## 2018-07-19 NOTE — Assessment & Plan Note (Signed)
He will continue Crestor.  He will have a lipid panel with lab work.

## 2018-07-21 ENCOUNTER — Encounter: Payer: Self-pay | Admitting: Family Medicine

## 2018-07-21 NOTE — Telephone Encounter (Signed)
Sent to PCP ?

## 2018-07-22 MED ORDER — BASAGLAR KWIKPEN 100 UNIT/ML ~~LOC~~ SOPN: 50.0000 [IU] | PEN_INJECTOR | Freq: Two times a day (BID) | SUBCUTANEOUS | 1 refills | Status: DC

## 2018-07-30 ENCOUNTER — Ambulatory Visit: Payer: Self-pay | Admitting: Family Medicine

## 2018-08-06 ENCOUNTER — Telehealth: Payer: Self-pay | Admitting: *Deleted

## 2018-08-06 DIAGNOSIS — IMO0002 Reserved for concepts with insufficient information to code with codable children: Secondary | ICD-10-CM

## 2018-08-06 DIAGNOSIS — E1139 Type 2 diabetes mellitus with other diabetic ophthalmic complication: Secondary | ICD-10-CM

## 2018-08-06 DIAGNOSIS — D638 Anemia in other chronic diseases classified elsewhere: Secondary | ICD-10-CM

## 2018-08-06 DIAGNOSIS — E785 Hyperlipidemia, unspecified: Secondary | ICD-10-CM

## 2018-08-06 DIAGNOSIS — E1165 Type 2 diabetes mellitus with hyperglycemia: Secondary | ICD-10-CM

## 2018-08-06 DIAGNOSIS — E1169 Type 2 diabetes mellitus with other specified complication: Secondary | ICD-10-CM

## 2018-08-06 DIAGNOSIS — I10 Essential (primary) hypertension: Secondary | ICD-10-CM

## 2018-08-06 NOTE — Telephone Encounter (Signed)
Please place future orders for lab appt on Monday

## 2018-08-09 ENCOUNTER — Other Ambulatory Visit: Payer: Self-pay

## 2018-08-14 ENCOUNTER — Other Ambulatory Visit: Payer: Self-pay | Admitting: Family Medicine

## 2018-08-16 NOTE — Telephone Encounter (Signed)
Last OV 07/19/2018   Insulin Glargine (BASAGLAR KWIKPEN) 100 UNIT/ML SOPN 30 mL 1 07/22/2018   Next OV 09/01/2018  Pt should be fine until there next OV and get refills at that time

## 2018-09-01 ENCOUNTER — Other Ambulatory Visit: Payer: Self-pay | Admitting: Family Medicine

## 2018-09-01 ENCOUNTER — Encounter: Payer: Self-pay | Admitting: Family Medicine

## 2018-09-01 ENCOUNTER — Other Ambulatory Visit: Payer: Self-pay

## 2018-09-01 NOTE — Progress Notes (Signed)
No show

## 2018-11-15 ENCOUNTER — Encounter: Payer: Self-pay | Admitting: Family Medicine

## 2018-11-19 ENCOUNTER — Other Ambulatory Visit: Payer: Self-pay

## 2018-11-19 ENCOUNTER — Encounter: Payer: Self-pay | Admitting: Family Medicine

## 2018-11-19 ENCOUNTER — Ambulatory Visit (INDEPENDENT_AMBULATORY_CARE_PROVIDER_SITE_OTHER): Payer: BC Managed Care – PPO | Admitting: Family Medicine

## 2018-11-19 DIAGNOSIS — M4722 Other spondylosis with radiculopathy, cervical region: Secondary | ICD-10-CM

## 2018-11-19 DIAGNOSIS — M4712 Other spondylosis with myelopathy, cervical region: Secondary | ICD-10-CM

## 2018-11-19 DIAGNOSIS — E1139 Type 2 diabetes mellitus with other diabetic ophthalmic complication: Secondary | ICD-10-CM | POA: Diagnosis not present

## 2018-11-19 DIAGNOSIS — I10 Essential (primary) hypertension: Secondary | ICD-10-CM | POA: Diagnosis not present

## 2018-11-19 DIAGNOSIS — E1165 Type 2 diabetes mellitus with hyperglycemia: Secondary | ICD-10-CM

## 2018-11-19 DIAGNOSIS — E1169 Type 2 diabetes mellitus with other specified complication: Secondary | ICD-10-CM

## 2018-11-19 DIAGNOSIS — E785 Hyperlipidemia, unspecified: Secondary | ICD-10-CM

## 2018-11-19 DIAGNOSIS — IMO0002 Reserved for concepts with insufficient information to code with codable children: Secondary | ICD-10-CM

## 2018-11-19 NOTE — Progress Notes (Signed)
Virtual Visit via video Note  This visit type was conducted due to national recommendations for restrictions regarding the COVID-19 pandemic (e.g. social distancing).  This format is felt to be most appropriate for this patient at this time.  All issues noted in this document were discussed and addressed.  No physical exam was performed (except for noted visual exam findings with Video Visits).   I connected with Chase Clark today at  9:00 AM EDT by a video enabled telemedicine application and verified that I am speaking with the correct person using two identifiers. Location patient: home Location provider: work Persons participating in the virtual visit: patient, provider  I discussed the limitations, risks, security and privacy concerns of performing an evaluation and management service by telephone and the availability of in person appointments. I also discussed with the patient that there may be a patient responsible charge related to this service. The patient expressed understanding and agreed to proceed.  Interactive audio and video telecommunications were attempted between this provider and patient, however failed, due to patient having technical difficulties OR patient did not have access to video capability.  We continued and completed visit with audio only.    Reason for visit: follow-up  HPI: DIABETES Disease Monitoring: Blood Sugar ranges-120-300 Polyuria/phagia/dipsia- no      Optho- due Medications: Compliance- taking NPH 100 units BID, regular insulin 50 u BID Hypoglycemic symptoms- no  HYPERTENSION  Disease Monitoring  Home BP Monitoring not checking Chest pain- no    Dyspnea- no Medications  Compliance-  Taking amlodine, losaratan, HCTZ, metoprolol.   Edema- no  HYPERLIPIDEMIA Symptoms Chest pain on exertion:  no   Leg claudication:   no Medications: Compliance- taking crestor Right upper quadrant pain- no  Muscle aches- no  Cervical radiculopathy: Patient  is status post cervical spine fusion.  He notes over the last 6+ months he has had issues with some numbness in his right arm particularly in his right hand favoring the ulnar aspect.  He notes he has had a hard time holding onto things as well.  He feels as though his right hand has become weaker than previously.  He does report previously being advised that there was some issue with a crimp in the brachial artery in his axilla and he wonders if this is related to a blood flow issue.  He notes his right hand is equally as warm as his left hand.  He has not followed up with his neurosurgeon recently.  Vision issues: Patient reports occasionally he will have issues with feeling as though his vision is under a glare and he will see some purple spots.  He has had no vision loss.  He notes this is an intermittent issue.  He will lay down and this will resolve and he wonders if it is related to his neck.  He notes no pain in his eyes.   ROS: See pertinent positives and negatives per HPI.  Past Medical History:  Diagnosis Date  . Arthritis   . Asthma    as child  . Coronary artery disease    DES DIAG1 11/20/09 [facility])  . Diabetes mellitus without complication ([place])   . Diabetic osteomyelitis ([place])   . Diabetic ulcer of foot with muscle involvement without evidence of necrosis ([place])    DIABETIC ULCERATIONS ASSOCIATED WITH IRRITATION LATERAL ANKLE LEFT GREATER THAN RIGHT WITH MILD CELLULITIS   . Diverticulosis   . DKA (diabetic ketoacidoses) ([place]) 08/23/2017  . Edema, lower extremity   .  Fatty liver   . GERD (gastroesophageal reflux disease)   . Gilbert's disease   . Glaucoma   . Heart disease 2011   Patient has stent for 80% blockage  . Hyperlipidemia   . Hypertension   . Neuropathy   . Seasonal allergies   . Shoulder pain   . Ulcer of foot ([place]) 08.06.2014   DIABETIC ULCERATIONS ASSOCIATED WITH IRRITATION LATERAL ANKLE LEFT GREATER THAN RIGHT WITH MILD CELLULITIS    Past Surgical History:   Procedure Laterality Date  . ABDOMINAL AORTOGRAM N/A 05/20/2016   Procedure: Abdominal Aortogram possible intervention;  Surgeon: Katha Cabal, MD;  Location: [facility];  Service: Cardiovascular;  Laterality: N/A;  . AMPUTATION Left 09/05/2017   Procedure: AMPUTATION BELOW KNEE;  Surgeon: Tania Ade, MD;  Location: [facility];  Service: Orthopedics;  Laterality: Left;  . ANTERIOR CERVICAL DECOMP/DISCECTOMY FUSION N/A 05/07/2017   Procedure: ANTERIOR CERVICAL DECOMPRESSION/DISCECTOMY Luevenia Maxin PROSTHESIS,PLATE/SCREWS CERVICAL FIVE - CERVICAL SIX;  Surgeon: Newman Pies, MD;  Location: [facility];  Service: Neurosurgery;  Laterality: N/A;  . CARDIAC CATHETERIZATION    . CATARACT EXTRACTION W/ INTRAOCULAR LENS IMPLANT    . CATARACT EXTRACTION W/PHACO Left 02/26/2017   Procedure: CATARACT EXTRACTION PHACO AND INTRAOCULAR LENS PLACEMENT (IOC);  Surgeon: Eulogio Bear, MD;  Location: [facility];  Service: Ophthalmology;  Laterality: Left;  Lot # [id number] H [place]: 00:25.3 AP%: 6.2 CDE: 1.59   . CHOLECYSTECTOMY N/A   . CORONARY ANGIOPLASTY WITH STENT PLACEMENT  2007   1st diagonal  . EPIBLEPHERON REPAIR WITH TEAR DUCT PROBING    . EYE SURGERY Right sept 29n2015   cataract extraction  . INSERTION EXPRESS TUBE SHUNT Right 09/06/2015   Procedure: INSERTION AHMED TUBE SHUNT with tutoplast allograft;  Surgeon: Eulogio Bear, MD;  Location: [facility];  Service: Ophthalmology;  Laterality: Right;  Marland Kitchen VASECTOMY      Family History  Problem Relation Age of Onset  . Hyperlipidemia Mother   . Diabetes Mother   . Liver disease Mother        NASH  . Hyperlipidemia Father   . Diabetes Father   . Heart disease Father   . Hypertension Father     SOCIAL HX: former smoker   Current Outpatient Medications:  .  acetaminophen (TYLENOL) 325 MG tablet, Take 1 tablet (325 mg total) by mouth every 6 (six) hours as needed for mild pain (pain score 1-3 or temp > 100.5)., Disp: 30 tablet,  Rfl: 0 .  amLODipine (NORVASC) 10 MG tablet, Take 1 tablet (10 mg total) by mouth daily., Disp: 90 tablet, Rfl: 3 .  aspirin EC 81 MG tablet, Take 81 mg by mouth daily., Disp: , Rfl:  .  brimonidine-timolol (COMBIGAN) 0.2-0.5 % ophthalmic solution, Place 1 drop into the right eye every 12 (twelve) hours. , Disp: , Rfl:  .  Continuous Blood Gluc Receiver ([place]) [place], 1 each by Does not apply route as directed. Use to check blood glucose 5 times daily, Disp: 1 Device, Rfl: 0 .  Continuous Blood Gluc Sensor (DEXCOM G6 SENSOR) MISC, 1 each by Does not apply route as directed. Place 1 sensor subcutaneously once every 10 days., Disp: 3 each, Rfl: 11 .  Continuous Blood Gluc Transmit (DEXCOM G6 TRANSMITTER) MISC, 1 each by Does not apply route as directed. Use to check blood glucose 5 times daily. Refill every 3 months., Disp: 1 each, Rfl: 3 .  DUREZOL 0.05 % EMUL, Place 1 drop into the left  eye at bedtime, Disp: , Rfl: 1 .  esomeprazole (NEXIUM) 40 MG capsule, Take 1 capsule (40 mg total) by mouth daily., Disp: 30 capsule, Rfl: 0 .  gabapentin (NEURONTIN) 300 MG capsule, Take 2 capsules (600 mg total) by mouth 2 (two) times daily., Disp: 360 capsule, Rfl: 3 .  Insulin Glargine (BASAGLAR KWIKPEN) 100 UNIT/ML SOPN, INJECT 50 UNITS TOTAL INTO THE SKIN 2 (TWO) TIMES DAILY., Disp: 90 pen, Rfl: 0 .  insulin lispro (HUMALOG KWIKPEN) 100 UNIT/ML KiwkPen, Inject 0.25 mLs (25 Units total) into the skin 3 (three) times daily with meals. And pen needles 4/day, Disp: 10 pen, Rfl: 3 .  latanoprost (XALATAN) 0.005 % ophthalmic solution, Place 1 drop into both eyes at bedtime., Disp: , Rfl: 5 .  losartan-hydrochlorothiazide (HYZAAR) 100-25 MG tablet, Take by mouth., Disp: , Rfl:  .  metoprolol tartrate (LOPRESSOR) 100 MG tablet, Take 0.5 tablets (50 mg total) by mouth 2 (two) times daily., Disp: 90 tablet, Rfl: 1 .  mupirocin ointment (BACTROBAN) 2 %, Apply 1 application topically 3 (three) times daily. Right  leg, Disp: 60 g, Rfl: 0 .  rosuvastatin (CRESTOR) 10 MG tablet, Take 1 tablet (10 mg total) by mouth daily., Disp: 90 tablet, Rfl: 3  EXAM: This was a telehealth telephone visit  ASSESSMENT AND PLAN:  Discussed the following assessment and plan:  Essential hypertension Undetermined control.  He will continue his current regimen.  We will have him in for lab work and a nurse BP check.  Hyperlipidemia associated with type 2 diabetes mellitus Continue Crestor.  Cervical spondylosis with myelopathy and radiculopathy Symptoms are concerning for cervical impingement issue.  It would seem unlikely that this would be related to a blood flow issue given lack of cool extremities.  Symptoms have been relatively stable for a number of months.  We will get him back in to see his neurosurgeon.  We will order an MRI to be completed in the next week.  Advised that if he has any worsening of his symptoms he needs to go to the emergency room for evaluation.  DM (diabetes mellitus), type 2, uncontrolled w/ophthalmic complication Uncontrolled.  He did not follow-up for lab work previously.  We will get him scheduled for this.  We will discuss his regimen with our clinical pharmacist.  He has known ophthalmologic disease related to his diabetes.  Given his vision issues I advised him to contact his ophthalmologist to schedule an appointment.   Message sent to referral coordinator to get MRI scheduled for sometime this coming week  Social distancing precautions and sick precautions given regarding COVID-19.   I discussed the assessment and treatment plan with the patient. The patient was provided an opportunity to ask questions and all were answered. The patient agreed with the plan and demonstrated an understanding of the instructions.   The patient was advised to call back or seek an in-person evaluation if the symptoms worsen or if the condition fails to improve as anticipated.  I provided 23 minutes of  non-face-to-face time during this encounter.   [name], MD

## 2018-11-20 NOTE — Assessment & Plan Note (Addendum)
Symptoms are concerning for cervical impingement issue.  It would seem unlikely that this would be related to a blood flow issue given lack of cool extremities.  Symptoms have been relatively stable for a number of months.  We will get him back in to see his neurosurgeon.  We will order an MRI to be completed in the next week.  Advised that if he has any worsening of his symptoms he needs to go to the emergency room for evaluation.

## 2018-11-20 NOTE — Assessment & Plan Note (Addendum)
Uncontrolled.  He did not follow-up for lab work previously.  We will get him scheduled for this.  We will discuss his regimen with our clinical pharmacist.  He has known ophthalmologic disease related to his diabetes.  Given his vision issues I advised him to contact his ophthalmologist to schedule an appointment.

## 2018-11-20 NOTE — Assessment & Plan Note (Signed)
Continue Crestor 

## 2018-11-20 NOTE — Assessment & Plan Note (Signed)
Undetermined control.  He will continue his current regimen.  We will have him in for lab work and a nurse BP check.

## 2018-11-23 ENCOUNTER — Telehealth: Payer: Self-pay

## 2018-11-23 NOTE — Telephone Encounter (Signed)
I received a approval for gabapentin today through cover my meds.  Approvedtoday CaseId:[id number];Status:Approved;Review Type:Prior Auth;Coverage Start Date:10/24/2018;Coverage End Date:11/23/2019;

## 2018-11-27 ENCOUNTER — Telehealth: Payer: Self-pay | Admitting: Family Medicine

## 2018-11-27 NOTE — Telephone Encounter (Signed)
-----   Message from De Hollingshead, [facility] sent at 11/22/2018  8:44 AM EDT ----- Hi!  I generally start with a 20% dose reduction when thinking NPH -> glargine/degludec, however, depends on his currently level of control.   20% reduction would be 80 units BID of Basaglar, and that's the max you can inject with that pen. As such, I'd actually consider a more concentrated insulin, like Toujeo U300 or Tresiba U200, so that you aren't capped at 80 units per injection.   In reality, I'd probably start somewhere between 50 units BID and 80 units BID, maybe just start with 60 BID? Then, Humalog 20 units TID would give you balanced basal/bolus amounts.   If cost was a problem/still is, definitely recommend going to the websites for whichever products and download coupon cards, since he isn't Medicare/Medicaid.   Catie  ----- Message ----- From: [name], MD Sent: 11/20/2018  12:52 PM EDT To: De Hollingshead, [facility]  Hey Catie,   This patient has been taking NPH 100 units twice daily and regular insulin 50 units twice daily for the past month.  His CBGs have ranged between 120 and 300.  Previously he was on basaglar 50 units twice daily and Humalog 10 units 3 times daily.  Would it be okay to switch him back to the basaglar/Humalog regimen as outlined compared to how much NPH and regular insulin he is using? Thanks.   Randall Hiss

## 2018-11-27 NOTE — Telephone Encounter (Signed)
Please let the patient know that I heard back from our clinical pharmacist and we have a plan in place for his diabetes regimen though I want to see what his A1c is prior to making any changes. Please reinforce that he needs to come in for his labs this week. Thanks.

## 2018-11-29 NOTE — Telephone Encounter (Signed)
Called patient to give him a message fro the provider patient has a mailbox that has not been setup, will try later.  Nina,cma

## 2018-12-01 NOTE — Telephone Encounter (Signed)
Called and informed the patient to get the labs done tomorrow because the provider and the pharmacist needs his A1C results for a regimen, patient understood.  Nina,cma

## 2018-12-02 ENCOUNTER — Other Ambulatory Visit: Payer: Self-pay

## 2018-12-02 ENCOUNTER — Other Ambulatory Visit: Payer: BC Managed Care – PPO

## 2018-12-13 DIAGNOSIS — B354 Tinea corporis: Secondary | ICD-10-CM | POA: Diagnosis not present

## 2018-12-17 ENCOUNTER — Other Ambulatory Visit (INDEPENDENT_AMBULATORY_CARE_PROVIDER_SITE_OTHER): Payer: BC Managed Care – PPO

## 2018-12-17 ENCOUNTER — Telehealth: Payer: Self-pay | Admitting: *Deleted

## 2018-12-17 ENCOUNTER — Other Ambulatory Visit: Payer: Self-pay

## 2018-12-17 DIAGNOSIS — D638 Anemia in other chronic diseases classified elsewhere: Secondary | ICD-10-CM

## 2018-12-17 DIAGNOSIS — E785 Hyperlipidemia, unspecified: Secondary | ICD-10-CM | POA: Diagnosis not present

## 2018-12-17 DIAGNOSIS — E1165 Type 2 diabetes mellitus with hyperglycemia: Secondary | ICD-10-CM | POA: Diagnosis not present

## 2018-12-17 DIAGNOSIS — I10 Essential (primary) hypertension: Secondary | ICD-10-CM | POA: Diagnosis not present

## 2018-12-17 DIAGNOSIS — N179 Acute kidney failure, unspecified: Secondary | ICD-10-CM

## 2018-12-17 DIAGNOSIS — E1169 Type 2 diabetes mellitus with other specified complication: Secondary | ICD-10-CM | POA: Diagnosis not present

## 2018-12-17 DIAGNOSIS — E1139 Type 2 diabetes mellitus with other diabetic ophthalmic complication: Secondary | ICD-10-CM

## 2018-12-17 DIAGNOSIS — IMO0002 Reserved for concepts with insufficient information to code with codable children: Secondary | ICD-10-CM

## 2018-12-17 LAB — LIPID PANEL
Cholesterol: 289 mg/dL — ABNORMAL HIGH (ref 0–200)
HDL: 36.3 mg/dL — ABNORMAL LOW (ref >39.00)
Total CHOL/HDL Ratio: 8
Triglycerides: 830 mg/dL — ABNORMAL HIGH (ref 0.0–149.0)

## 2018-12-17 LAB — COMPREHENSIVE METABOLIC PANEL
ALT: 13 U/L (ref 0–53)
AST: 20 U/L (ref 0–37)
Albumin: 3.1 g/dL — ABNORMAL LOW (ref 3.5–5.2)
Alkaline Phosphatase: 112 U/L (ref 39–117)
BUN: 32 mg/dL — ABNORMAL HIGH (ref 6–23)
CO2: 24 mEq/L (ref 19–32)
Calcium: 8.8 mg/dL (ref 8.4–10.5)
Chloride: 95 mEq/L — ABNORMAL LOW (ref 96–112)
Creatinine, Ser: 2.6 mg/dL — ABNORMAL HIGH (ref 0.40–1.50)
GFR: 25.72 mL/min — ABNORMAL LOW (ref >60.00)
Glucose, Bld: 506 mg/dL (ref 70–99)
Potassium: 4.1 mEq/L (ref 3.5–5.1)
Sodium: 128 mEq/L — ABNORMAL LOW (ref 135–145)
Total Bilirubin: 1 mg/dL (ref 0.2–1.2)
Total Protein: 5.9 g/dL — ABNORMAL LOW (ref 6.0–8.3)

## 2018-12-17 LAB — CBC
HCT: 43.5 % (ref 39.0–52.0)
Hemoglobin: 14.4 g/dL (ref 13.0–17.0)
MCHC: 33.2 g/dL (ref 30.0–36.0)
MCV: 86.8 fl (ref 78.0–100.0)
Platelets: 185 10*3/uL (ref 150.0–400.0)
RBC: 5.02 Mil/uL (ref 4.22–5.81)
RDW: 13.7 % (ref 11.5–15.5)
WBC: 8.4 10*3/uL (ref 4.0–10.5)

## 2018-12-17 LAB — LDL CHOLESTEROL, DIRECT: Direct LDL: 130 mg/dL

## 2018-12-17 LAB — HEMOGLOBIN A1C: Hgb A1c MFr Bld: 11.2 % — ABNORMAL HIGH (ref 4.6–6.5)

## 2018-12-17 MED ORDER — BASAGLAR KWIKPEN 100 UNIT/ML ~~LOC~~ SOPN: PEN_INJECTOR | SUBCUTANEOUS | 1 refills | Status: DC

## 2018-12-17 MED ORDER — INSULIN LISPRO (1 UNIT DIAL) 100 UNIT/ML (KWIKPEN): 20.0000 [IU] | PEN_INJECTOR | Freq: Three times a day (TID) | SUBCUTANEOUS | 1 refills | Status: DC

## 2018-12-17 NOTE — Addendum Note (Signed)
Addended by: [name] on: 12/17/2018 05:24 PM   Modules accepted: Orders

## 2018-12-17 NOTE — Telephone Encounter (Signed)
Please contact the patient and let him know that his A1c is very uncontrolled and his glucose was very elevated at 506 this morning. His kidney function is quite a bit worse that is was previously. Please see how he is feeling. Is he nauseous, vomiting, fatigued, having decreased urine output? Please find out if he has checked his glucose today and if so what number did he get. With his worsened kidney function I would suggest he go to the ED for evaluation so they could potentially give him fluids and get his glucose down. Thanks.

## 2018-12-17 NOTE — Telephone Encounter (Signed)
Noted. I will send in his new insulin prescription for him. Please advise that there is the potential that his kidney function could get worse or be permanently reduced from his prior base line if he does not receive appropriate fluids and evaluation. If he does not want to go to the ED then he needs to hydrate and we need to recheck his kidney function early next week. Thanks.

## 2018-12-17 NOTE — Telephone Encounter (Signed)
Patient still declined going to the ED.  Patient prefers to hydrate and recheck kidney function next week.  Pt scheduled a lab appt for 12/21/18 @ 3:45 pm.

## 2018-12-17 NOTE — Telephone Encounter (Signed)
Critical lab-    Glucose value- 506     -lab collect this morning 8:36

## 2018-12-17 NOTE — Telephone Encounter (Signed)
Labs ordered. Insulin sent to pharmacy.

## 2018-12-17 NOTE — Telephone Encounter (Signed)
Called and spoke to patient. Patient aware of lab results for glucose and worsening kidney function and recommendation from PCP to go to ED to be evaluated.  Patient said that he does not want to go to the ED because the last time he went they did nothing and he does not want to be around sick people.  Patient feels that his insulin is up because he is out of his insulin Rx and feels he can get his insulin under control if he can get his Rx refilled.  Patient said that he has not had his insulin in about 6 weeks.  Patient rechecked his blood sugar.  BS was 217.  Patient said that he will stay well hydrated and will go to ED if symptoms worsen.

## 2018-12-21 ENCOUNTER — Other Ambulatory Visit: Payer: Self-pay

## 2018-12-21 ENCOUNTER — Other Ambulatory Visit (INDEPENDENT_AMBULATORY_CARE_PROVIDER_SITE_OTHER): Payer: BC Managed Care – PPO

## 2018-12-21 DIAGNOSIS — N179 Acute kidney failure, unspecified: Secondary | ICD-10-CM | POA: Diagnosis not present

## 2018-12-22 LAB — BASIC METABOLIC PANEL
BUN: 31 mg/dL — ABNORMAL HIGH (ref 6–23)
CO2: 23 mEq/L (ref 19–32)
Calcium: 8.7 mg/dL (ref 8.4–10.5)
Chloride: 101 mEq/L (ref 96–112)
Creatinine, Ser: 2.77 mg/dL — ABNORMAL HIGH (ref 0.40–1.50)
GFR: 23.9 mL/min — ABNORMAL LOW (ref >60.00)
Glucose, Bld: 210 mg/dL — ABNORMAL HIGH (ref 70–99)
Potassium: 4 mEq/L (ref 3.5–5.1)
Sodium: 133 mEq/L — ABNORMAL LOW (ref 135–145)

## 2018-12-25 ENCOUNTER — Other Ambulatory Visit: Payer: Self-pay | Admitting: Family Medicine

## 2018-12-25 DIAGNOSIS — N183 Chronic kidney disease, stage 3 (moderate): Secondary | ICD-10-CM

## 2018-12-25 DIAGNOSIS — N179 Acute kidney failure, unspecified: Secondary | ICD-10-CM

## 2018-12-27 ENCOUNTER — Telehealth: Payer: Self-pay | Admitting: Family Medicine

## 2018-12-27 ENCOUNTER — Telehealth: Payer: Self-pay

## 2018-12-27 DIAGNOSIS — N179 Acute kidney failure, unspecified: Secondary | ICD-10-CM

## 2018-12-27 DIAGNOSIS — N183 Chronic kidney disease, stage 3 (moderate): Secondary | ICD-10-CM

## 2018-12-27 DIAGNOSIS — E1139 Type 2 diabetes mellitus with other diabetic ophthalmic complication: Secondary | ICD-10-CM

## 2018-12-27 DIAGNOSIS — IMO0002 Reserved for concepts with insufficient information to code with codable children: Secondary | ICD-10-CM

## 2018-12-27 NOTE — Telephone Encounter (Signed)
I have sent a message to Chase Clark through the secure chat function in epic to send the referral to Dr Lorrene Reid. [name] this message to her as well.

## 2018-12-27 NOTE — Telephone Encounter (Signed)
Copied from [place] [id number]. Topic: Appointment Scheduling - Scheduling Inquiry for Clinic >> Dec 27, 2018  8:29 AM Virl Axe D wrote: Reason for CRM: Pt's wife called to schedule OV for pt regarding diabetes/insulin follow up. No availability until 01/10/19. She would like to see if Dr. [name] would consider working pt in as soon as possible. Did not state pt was having symptoms of any kind but it's very important he be seen soon. Please advise. CB#[phone]

## 2018-12-27 NOTE — Telephone Encounter (Signed)
Spouse checking on patient nephrologist referral, spouse would like referral to be placed with Lucrezia Starch MD [address] [place], [place] [address] [phone], please advise patient when referral is placed.

## 2018-12-27 NOTE — Telephone Encounter (Signed)
Patient was calling checking on referral for nephrologist and to let you know they prefer the nephrologist to be Jamal Maes B MD [place], [place]  The phone number to their office is [phone].  Thanks  Nina,cma

## 2018-12-28 NOTE — Telephone Encounter (Signed)
Can you call the pharmacy and see what his insurance covers regarding long acting insulin that would be similar to basaglar? Thanks.

## 2018-12-28 NOTE — Telephone Encounter (Signed)
Pt's wife want call back from nurse to discuss a special request that need to be sent to pharmacy for pt to get  His insulin.

## 2018-12-28 NOTE — Telephone Encounter (Signed)
Patient states you changed his medication from Lantus to a new insulin and his insurance will not pay for it. Can you try an alternative.  Nina,cma

## 2018-12-29 NOTE — Telephone Encounter (Signed)
Ordered

## 2018-12-29 NOTE — Telephone Encounter (Signed)
Noted. I would like to recheck his kidney function later this week given how long it is taking to get him seen by them. Please see if he is willing to have this rechecked. Thanks.

## 2018-12-29 NOTE — Telephone Encounter (Signed)
Called and scheduled a lab appointment for this Friday with the patient.  Nina,cma

## 2018-12-29 NOTE — Telephone Encounter (Signed)
Yes it was sent to Dr. Clare Gandy office in [place] on 9/14. They have it and are reviewing it. They should be contacting him today or tomorrow to schedule. [employer]

## 2018-12-31 ENCOUNTER — Emergency Department (HOSPITAL_COMMUNITY)
Admission: EM | Admit: 2018-12-31 | Discharge: 2018-12-31 | Disposition: A | Discharge status: Home / Self Care | Payer: BC Managed Care – PPO | Attending: Emergency Medicine | Admitting: Emergency Medicine

## 2018-12-31 ENCOUNTER — Other Ambulatory Visit (INDEPENDENT_AMBULATORY_CARE_PROVIDER_SITE_OTHER): Payer: BC Managed Care – PPO

## 2018-12-31 ENCOUNTER — Other Ambulatory Visit: Payer: Self-pay

## 2018-12-31 ENCOUNTER — Encounter (HOSPITAL_COMMUNITY): Payer: Self-pay | Admitting: Emergency Medicine

## 2018-12-31 DIAGNOSIS — E1122 Type 2 diabetes mellitus with diabetic chronic kidney disease: Secondary | ICD-10-CM | POA: Diagnosis not present

## 2018-12-31 DIAGNOSIS — N189 Chronic kidney disease, unspecified: Secondary | ICD-10-CM | POA: Diagnosis not present

## 2018-12-31 DIAGNOSIS — R002 Palpitations: Secondary | ICD-10-CM | POA: Diagnosis not present

## 2018-12-31 DIAGNOSIS — I259 Chronic ischemic heart disease, unspecified: Secondary | ICD-10-CM | POA: Diagnosis not present

## 2018-12-31 DIAGNOSIS — Z7982 Long term (current) use of aspirin: Secondary | ICD-10-CM | POA: Diagnosis not present

## 2018-12-31 DIAGNOSIS — N183 Chronic kidney disease, stage 3 (moderate): Secondary | ICD-10-CM | POA: Diagnosis not present

## 2018-12-31 DIAGNOSIS — Z79899 Other long term (current) drug therapy: Secondary | ICD-10-CM | POA: Diagnosis not present

## 2018-12-31 DIAGNOSIS — Z87891 Personal history of nicotine dependence: Secondary | ICD-10-CM | POA: Insufficient documentation

## 2018-12-31 DIAGNOSIS — R110 Nausea: Secondary | ICD-10-CM | POA: Diagnosis not present

## 2018-12-31 DIAGNOSIS — Z794 Long term (current) use of insulin: Secondary | ICD-10-CM | POA: Insufficient documentation

## 2018-12-31 DIAGNOSIS — R42 Dizziness and giddiness: Secondary | ICD-10-CM | POA: Diagnosis not present

## 2018-12-31 DIAGNOSIS — Z23 Encounter for immunization: Secondary | ICD-10-CM | POA: Diagnosis not present

## 2018-12-31 DIAGNOSIS — I129 Hypertensive chronic kidney disease with stage 1 through stage 4 chronic kidney disease, or unspecified chronic kidney disease: Secondary | ICD-10-CM | POA: Diagnosis not present

## 2018-12-31 DIAGNOSIS — N179 Acute kidney failure, unspecified: Secondary | ICD-10-CM | POA: Diagnosis not present

## 2018-12-31 DIAGNOSIS — I10 Essential (primary) hypertension: Secondary | ICD-10-CM | POA: Diagnosis not present

## 2018-12-31 LAB — URINALYSIS, ROUTINE W REFLEX MICROSCOPIC
Bacteria, UA: NONE SEEN
Bilirubin Urine: NEGATIVE
Glucose, UA: >=500 mg/dL — AB
Ketones, ur: NEGATIVE mg/dL
Leukocytes,Ua: NEGATIVE
Nitrite: NEGATIVE
Protein, ur: >=300 mg/dL — AB
Specific Gravity, Urine: 1.005 (ref 1.005–1.030)
pH: 6 (ref 5.0–8.0)

## 2018-12-31 LAB — BASIC METABOLIC PANEL
Anion gap: 10 (ref 5–15)
BUN: 23 mg/dL — ABNORMAL HIGH (ref 6–20)
CO2: 20 mmol/L — ABNORMAL LOW (ref 22–32)
Calcium: 9 mg/dL (ref 8.9–10.3)
Chloride: 102 mmol/L (ref 98–111)
Creatinine, Ser: 2.42 mg/dL — ABNORMAL HIGH (ref 0.61–1.24)
GFR calc Af Amer: 34 mL/min — ABNORMAL LOW (ref >60)
GFR calc non Af Amer: 29 mL/min — ABNORMAL LOW (ref >60)
Glucose, Bld: 205 mg/dL — ABNORMAL HIGH (ref 70–99)
Potassium: 4.7 mmol/L (ref 3.5–5.1)
Sodium: 132 mmol/L — ABNORMAL LOW (ref 135–145)

## 2018-12-31 LAB — CBC
HCT: 41.9 % (ref 39.0–52.0)
Hemoglobin: 14.3 g/dL (ref 13.0–17.0)
MCH: 29.9 pg (ref 26.0–34.0)
MCHC: 34.1 g/dL (ref 30.0–36.0)
MCV: 87.7 fL (ref 80.0–100.0)
Platelets: 233 10*3/uL (ref 150–400)
RBC: 4.78 MIL/uL (ref 4.22–5.81)
RDW: 13.2 % (ref 11.5–15.5)
WBC: 9.2 10*3/uL (ref 4.0–10.5)
nRBC: 0 % (ref 0.0–0.2)

## 2018-12-31 MED ORDER — AMLODIPINE BESYLATE 5 MG PO TABS
10.0000 mg | ORAL_TABLET | Freq: Once | ORAL | Status: AC
  Administered 2018-12-31: 10 mg via ORAL
  Filled 2018-12-31: qty 2

## 2018-12-31 MED ORDER — SODIUM CHLORIDE 0.9% FLUSH: 3.0000 mL | Freq: Once | INTRAVENOUS | Status: DC

## 2018-12-31 MED ORDER — METOPROLOL TARTRATE 25 MG PO TABS
50.0000 mg | ORAL_TABLET | Freq: Once | ORAL | Status: AC
  Administered 2018-12-31: 50 mg via ORAL
  Filled 2018-12-31: qty 2

## 2018-12-31 MED ORDER — SODIUM CHLORIDE 0.9 % IV BOLUS
500.0000 mL | Freq: Once | INTRAVENOUS | Status: AC
  Administered 2018-12-31: 500 mL via INTRAVENOUS

## 2018-12-31 MED ORDER — MECLIZINE HCL 12.5 MG PO TABS: 12.5000 mg | ORAL_TABLET | Freq: Three times a day (TID) | ORAL | 0 refills | Status: DC | PRN

## 2018-12-31 NOTE — ED Triage Notes (Signed)
Onset 1 month ago developed general weakness and dizziness. States past few days symptoms worsening overtime with nausea and emesis.

## 2018-12-31 NOTE — ED Notes (Signed)
Pt. Chase Clark Pain but has intermittent dizziness over the last two days

## 2018-12-31 NOTE — ED Notes (Signed)
Patient verbalizes understanding of discharge instructions. Opportunity for questioning and answers were provided. Armband removed by staff, pt discharged from ED via wheelchair to home.  

## 2018-12-31 NOTE — ED Provider Notes (Signed)
[facility] EMERGENCY DEPARTMENT Provider Note   CSN: [id number] Arrival date & time: 12/31/18  1717     History   Chief Complaint Chief Complaint  Patient presents with  . Near Syncope    HPI Chase Clark is a 54 y.o. male.     53 year old male with prior medical history as detailed below presents for evaluation of intermittent dizziness.  He reports that his symptoms have been ongoing for at least the last month.  Patient has a history of chronic renal insufficiency.  He is concerned that his kidney function may have worsened.  He requests a creatinine checked.  He is otherwise without significant complaint.  He reports that he is not currently experiencing dizziness.  The history is provided by the patient and medical records.  Illness Location:  Dizziness Severity:  Mild Onset quality:  Gradual Duration:  1 month Timing:  Intermittent Progression:  Waxing and waning Associated symptoms: no chest pain, no fever, no headaches and no shortness of breath     Past Medical History:  Diagnosis Date  . Arthritis   . Asthma    as child  . Coronary artery disease    DES DIAG1 11/20/09 [facility])  . Diabetes mellitus without complication ([place])   . Diabetic osteomyelitis ([place])   . Diabetic ulcer of foot with muscle involvement without evidence of necrosis ([place])    DIABETIC ULCERATIONS ASSOCIATED WITH IRRITATION LATERAL ANKLE LEFT GREATER THAN RIGHT WITH MILD CELLULITIS   . Diverticulosis   . DKA (diabetic ketoacidoses) ([place]) 08/23/2017  . Edema, lower extremity   . Fatty liver   . GERD (gastroesophageal reflux disease)   . Gilbert's disease   . Glaucoma   . Heart disease 2011   Patient has stent for 80% blockage  . Hyperlipidemia   . Hypertension   . Neuropathy   . Seasonal allergies   . Shoulder pain   . Ulcer of foot ([place]) 08.06.2014   DIABETIC ULCERATIONS ASSOCIATED WITH IRRITATION LATERAL ANKLE LEFT GREATER THAN RIGHT WITH MILD CELLULITIS     Patient Active Problem List   Diagnosis Date Noted  . Allergic rhinitis 07/19/2018  . Abnormality of gait 12/02/2017  . Labile blood pressure   . Labile blood glucose   . Amputation of left lower extremity below knee ([place]) 09/09/2017  . Benign prostatic hyperplasia   . Hyperlipidemia   . Stage 3 chronic kidney disease ([place])   . Neuropathic pain   . Osteomyelitis of left foot ([place])   . Coronary artery disease involving native coronary artery of native heart without angina pectoris   . Anemia of chronic disease   . Leukocytosis   . Pressure injury of skin 08/24/2017  . Cervical spondylosis with myelopathy and radiculopathy 05/07/2017  . ARF (acute renal failure) ([place]) 04/30/2017  . Fatty liver 04/04/2017  . DDD (degenerative disc disease), cervical 01/13/2017  . PAD (peripheral artery disease) ([place]) 08/07/2016  . Ulcer of ankle ([place]) 08/07/2016  . Chronic diarrhea 05/17/2016  . Hyponatremia 05/17/2016  . Anemia 01/12/2014  . Disorder of ligament of ankle 07/31/2013  . Edema 06/28/2013  . 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 Obesity (BMI 30-39.9) 06/12/2013  . Pain in joint, shoulder region 06/12/2013  . Statin intolerance 06/10/2013  . DM (diabetes mellitus), type 2, uncontrolled w/ophthalmic complication ([place]) 19/37/9024  . Hyperlipidemia associated with type 2 diabetes mellitus ([place]) 07/17/2006  . GILBERT'S SYNDROME 07/17/2006  . Essential hypertension 07/17/2006  . Coronary atherosclerosis 07/17/2006  .  OSTEOARTHRITIS 07/17/2006  . COUGH 07/17/2006    Past Surgical History:  Procedure Laterality Date  . ABDOMINAL AORTOGRAM N/A 05/20/2016   Procedure: Abdominal Aortogram possible intervention;  Surgeon: Katha Cabal, MD;  Location: [facility];  Service: Cardiovascular;  Laterality: N/A;  . AMPUTATION Left 09/05/2017   Procedure: AMPUTATION BELOW KNEE;  Surgeon: Tania Ade, MD;  Location: [facility];  Service: Orthopedics;  Laterality: Left;   . ANTERIOR CERVICAL DECOMP/DISCECTOMY FUSION N/A 05/07/2017   Procedure: ANTERIOR CERVICAL DECOMPRESSION/DISCECTOMY Luevenia Maxin PROSTHESIS,PLATE/SCREWS CERVICAL FIVE - CERVICAL SIX;  Surgeon: Newman Pies, MD;  Location: [facility];  Service: Neurosurgery;  Laterality: N/A;  . CARDIAC CATHETERIZATION    . CATARACT EXTRACTION W/ INTRAOCULAR LENS IMPLANT    . CATARACT EXTRACTION W/PHACO Left 02/26/2017   Procedure: CATARACT EXTRACTION PHACO AND INTRAOCULAR LENS PLACEMENT (IOC);  Surgeon: Eulogio Bear, MD;  Location: [facility];  Service: Ophthalmology;  Laterality: Left;  Lot # [id number] H [place]: 00:25.3 AP%: 6.2 CDE: 1.59   . CHOLECYSTECTOMY N/A   . CORONARY ANGIOPLASTY WITH STENT PLACEMENT  2007   1st diagonal  . EPIBLEPHERON REPAIR WITH TEAR DUCT PROBING    . EYE SURGERY Right sept 29n2015   cataract extraction  . INSERTION EXPRESS TUBE SHUNT Right 09/06/2015   Procedure: INSERTION AHMED TUBE SHUNT with tutoplast allograft;  Surgeon: Eulogio Bear, MD;  Location: [facility];  Service: Ophthalmology;  Laterality: Right;  Marland Kitchen VASECTOMY          Home Medications    Prior to Admission medications   Medication Sig Start Date End Date Taking? Authorizing Provider  acetaminophen (TYLENOL) 325 MG tablet Take 1 tablet (325 mg total) by mouth every 6 (six) hours as needed for mild pain (pain score 1-3 or temp > 100.5). 09/06/17  Yes Grier Mitts, PA-C  amLODipine (NORVASC) 10 MG tablet Take 1 tablet (10 mg total) by mouth daily. 07/19/18  Yes [name], MD  aspirin EC 81 MG tablet Take 81 mg by mouth daily.   Yes [provider]  esomeprazole (NEXIUM) 20 MG capsule Take 20 mg by mouth daily at 12 noon.   Yes [provider]  gabapentin (NEURONTIN) 300 MG capsule Take 2 capsules (600 mg total) by mouth 2 (two) times daily. Patient taking differently: Take 300-600 mg by mouth See admin instructions. Take 300mg  in the AM and 600mg  in the PM 07/19/18  Yes Sonnenberg,  [name], MD  insulin aspart (NOVOLOG) 100 UNIT/ML injection Inject 15 Units into the skin 4 (four) times daily.   Yes [provider]  ketoconazole (NIZORAL) 2 % cream Apply 1 application topically 2 (two) times daily. 12/13/18  Yes [provider]  metoprolol tartrate (LOPRESSOR) 100 MG tablet Take 0.5 tablets (50 mg total) by mouth 2 (two) times daily. 07/19/18  Yes [name], MD  rosuvastatin (CRESTOR) 10 MG tablet Take 1 tablet (10 mg total) by mouth daily. 07/19/18  Yes [name], MD  brimonidine-timolol (COMBIGAN) 0.2-0.5 % ophthalmic solution Place 1 drop into the right eye every 12 (twelve) hours.     [provider]  Continuous Blood Gluc Receiver ([place]) [place] 1 each by Does not apply route as directed. Use to check blood glucose 5 times daily 08/26/17   [name], MD  Continuous Blood Gluc Sensor (DEXCOM G6 SENSOR) MISC 1 each by Does not apply route as directed. Place 1 sensor subcutaneously once every 10 days. 08/26/17   [name],  [name], MD  Continuous Blood Gluc Transmit (DEXCOM G6 TRANSMITTER) MISC 1 each by Does not apply route as directed. Use to check blood glucose 5 times daily. Refill every 3 months. 08/26/17   [name], MD  DUREZOL 0.05 % EMUL Place 1 drop into the left eye at bedtime.  03/27/17   [provider]  insulin lispro (HUMALOG KWIKPEN) 100 UNIT/ML KwikPen Inject 0.2 mLs (20 Units total) into the skin 3 (three) times daily with meals. Patient not taking: Reported on 12/31/2018 12/17/18   [name], MD  latanoprost (XALATAN) 0.005 % ophthalmic solution Place 1 drop into both eyes at bedtime. 03/03/17   [provider]  meclizine (ANTIVERT) 12.5 MG tablet Take 1 tablet (12.5 mg total) by mouth 3 (three) times daily as needed for dizziness. 12/31/18   Valarie Merino, MD    Family History Family History  Problem Relation Age of Onset  . Hyperlipidemia Mother   . Diabetes  Mother   . Liver disease Mother        NASH  . Hyperlipidemia Father   . Diabetes Father   . Heart disease Father   . Hypertension Father     Social History Social History   Tobacco Use  . Smoking status: Former Smoker    Types: Cigarettes    Quit date: 03/14/2008    Years since quitting: 10.8  . Smokeless tobacco: Never Used  Substance Use Topics  . Alcohol use: Yes    Comment: OCCAS  . Drug use: No     Allergies   Patient has no known allergies.   Review of Systems Review of Systems  Constitutional: Negative for fever.  Respiratory: Negative for shortness of breath.   Cardiovascular: Negative for chest pain.  Neurological: Negative for headaches.  All other systems reviewed and are negative.    Physical Exam Updated Vital Signs BP (!) 171/99   Pulse 76   Temp 98 F (36.7 C) (Oral)   Resp 14   Ht 5\' 10"  (1.778 m)   Wt 95.3 kg   SpO2 100%   BMI 30.13 kg/m   Physical Exam Vitals signs and nursing note reviewed.  Constitutional:      General: He is not in acute distress.    Appearance: Normal appearance. He is well-developed.  HENT:     Head: Normocephalic and atraumatic.  Eyes:     Conjunctiva/sclera: Conjunctivae normal.     Pupils: Pupils are equal, round, and reactive to light.  Neck:     Musculoskeletal: Normal range of motion and neck supple.  Cardiovascular:     Rate and Rhythm: Normal rate and regular rhythm.     Heart sounds: Normal heart sounds.  Pulmonary:     Effort: Pulmonary effort is normal. No respiratory distress.     Breath sounds: Normal breath sounds.  Abdominal:     General: There is no distension.     Palpations: Abdomen is soft.     Tenderness: There is no abdominal tenderness.  Musculoskeletal: Normal range of motion.        General: No deformity.  Skin:    General: Skin is warm and dry.  Neurological:     General: No focal deficit present.     Mental Status: He is alert and oriented to person, place, and time.  Mental status is at baseline.     Cranial Nerves: No cranial nerve deficit.     Sensory: No sensory deficit.  Motor: No weakness.     Coordination: Coordination normal.      ED Treatments / Results  Labs (all labs ordered are listed, but only abnormal results are displayed) Labs Reviewed  BASIC METABOLIC PANEL - Abnormal; Notable for the following components:      Result Value   Sodium 132 (*)    CO2 20 (*)    Glucose, Bld 205 (*)    BUN 23 (*)    Creatinine, Ser 2.42 (*)    GFR calc non Af Amer 29 (*)    GFR calc Af Amer 34 (*)    All other components within normal limits  URINALYSIS, ROUTINE W REFLEX MICROSCOPIC - Abnormal; Notable for the following components:   Color, Urine STRAW (*)    Glucose, UA >=500 (*)    Hgb urine dipstick SMALL (*)    Protein, ur >=300 (*)    All other components within normal limits  CBC  CBG MONITORING, ED    EKG EKG Interpretation  Date/Time:  Friday December 31 2018 17:39:15 EDT Ventricular Rate:  89 PR Interval:  210 QRS Duration: 90 QT Interval:  362 QTC Calculation: 440 R Axis:     Text Interpretation:  Sinus rhythm with 1st degree A-V block Otherwise normal ECG Confirmed by Dene Gentry [phone]) on 12/31/2018 8:31:16 PM   Radiology No results found.  Procedures Procedures (including critical care time)  Medications Ordered in ED Medications  sodium chloride flush (NS) 0.9 % injection 3 mL (3 mLs Intravenous Not Given 12/31/18 2048)  sodium chloride 0.9 % bolus 500 mL (500 mLs Intravenous New Bag/Given 12/31/18 2054)  amLODipine (NORVASC) tablet 10 mg (10 mg Oral Given 12/31/18 2157)  metoprolol tartrate (LOPRESSOR) tablet 50 mg (50 mg Oral Given 12/31/18 2156)     Initial Impression / Assessment and Plan / ED Course  I have reviewed the triage vital signs and the nursing notes.  Pertinent labs & imaging results that were available during my care of the patient were reviewed by me and considered in my medical decision  making (see chart for details).        MDM  Screen complete  Chase Clark was evaluated in Emergency Department on 12/31/2018 for the symptoms described in the history of present illness. He was evaluated in the context of the global COVID-19 pandemic, which necessitated consideration that the patient might be at risk for infection with the SARS-CoV-2 virus that causes COVID-19. Institutional protocols and algorithms that pertain to the evaluation of patients at risk for COVID-19 are in a state of rapid change based on information released by regulatory bodies including the CDC and federal and state organizations. These policies and algorithms were followed during the patient's care in the ED.  Patient presented for evaluation of possible worsening renal function  Patient's kidney function appears to be at his baseline  Case discussed with [facility] who agrees with plan for close follow-up as an outpatient next week.  Patient feels improved following his ED evaluation.  He desires discharge.  He does understand the need for close follow-up.  Strict return precautions given and understood.    Final Clinical Impressions(s) / ED Diagnoses   Final diagnoses:  Chronic renal failure, unspecified CKD stage    ED Discharge Orders         Ordered    meclizine (ANTIVERT) 12.5 MG tablet  3 times daily PRN     12/31/18 2316  Valarie Merino, MD 12/31/18 [phone]

## 2018-12-31 NOTE — Discharge Instructions (Signed)
Please return for any problem.  Follow-up with your regular care provider as instructed. Follow-up with nephrology as previously arranged.

## 2019-01-01 LAB — BASIC METABOLIC PANEL
BUN/Creatinine Ratio: 10 (calc) (ref 6–22)
BUN: 25 mg/dL (ref 7–25)
CO2: 18 mmol/L — ABNORMAL LOW (ref 20–32)
Calcium: 8.7 mg/dL (ref 8.6–10.3)
Chloride: 102 mmol/L (ref 98–110)
Creat: 2.42 mg/dL — ABNORMAL HIGH (ref 0.70–1.33)
Glucose, Bld: 166 mg/dL — ABNORMAL HIGH (ref 65–99)
Potassium: 4.5 mmol/L (ref 3.5–5.3)
Sodium: 134 mmol/L — ABNORMAL LOW (ref 135–146)

## 2019-01-03 LAB — CBG MONITORING, ED: Glucose-Capillary: 174 mg/dL — ABNORMAL HIGH (ref 70–99)

## 2019-01-04 NOTE — Telephone Encounter (Signed)
I called the pharamcy and she states their is no alternative to basglar.  Nina,cma

## 2019-01-04 NOTE — Telephone Encounter (Signed)
Noted. Please see if the patient would be willing to meet with Catie so we can see about patient assistance or alternative insulins. I can refer if he is willing.

## 2019-01-07 ENCOUNTER — Telehealth: Payer: Self-pay

## 2019-01-07 DIAGNOSIS — I129 Hypertensive chronic kidney disease with stage 1 through stage 4 chronic kidney disease, or unspecified chronic kidney disease: Secondary | ICD-10-CM | POA: Diagnosis not present

## 2019-01-07 DIAGNOSIS — N184 Chronic kidney disease, stage 4 (severe): Secondary | ICD-10-CM | POA: Diagnosis not present

## 2019-01-07 DIAGNOSIS — R809 Proteinuria, unspecified: Secondary | ICD-10-CM | POA: Diagnosis not present

## 2019-01-07 DIAGNOSIS — N183 Chronic kidney disease, stage 3 (moderate): Secondary | ICD-10-CM | POA: Diagnosis not present

## 2019-01-07 DIAGNOSIS — N179 Acute kidney failure, unspecified: Secondary | ICD-10-CM | POA: Diagnosis not present

## 2019-01-07 DIAGNOSIS — B354 Tinea corporis: Secondary | ICD-10-CM | POA: Diagnosis not present

## 2019-01-08 NOTE — Telephone Encounter (Signed)
Please call the patient.  I want him to meet with Catie to work on diabetic management and to help with his insulin.  If he is willing to do that I will place a referral.  Thanks.

## 2019-01-09 ENCOUNTER — Encounter: Payer: Self-pay | Admitting: Family Medicine

## 2019-01-10 ENCOUNTER — Ambulatory Visit: Payer: Self-pay | Admitting: Pharmacist

## 2019-01-10 DIAGNOSIS — IMO0002 Reserved for concepts with insufficient information to code with codable children: Secondary | ICD-10-CM

## 2019-01-10 DIAGNOSIS — N179 Acute kidney failure, unspecified: Secondary | ICD-10-CM

## 2019-01-10 DIAGNOSIS — E1139 Type 2 diabetes mellitus with other diabetic ophthalmic complication: Secondary | ICD-10-CM

## 2019-01-10 DIAGNOSIS — I10 Essential (primary) hypertension: Secondary | ICD-10-CM

## 2019-01-10 MED ORDER — INSULIN PEN NEEDLE 32G X 4 MM MISC: 5.0000 "pen " | Freq: Every day | 1 refills | Status: DC

## 2019-01-10 MED ORDER — INSULIN ASPART 100 UNIT/ML ~~LOC~~ SOLN: SUBCUTANEOUS | 1 refills | Status: DC

## 2019-01-10 MED ORDER — INSULIN GLARGINE 100 UNIT/ML SOLOSTAR PEN: 60.0000 [IU] | PEN_INJECTOR | Freq: Two times a day (BID) | SUBCUTANEOUS | 3 refills | Status: DC

## 2019-01-10 NOTE — Addendum Note (Signed)
Addended by: [name] on: 01/10/2019 09:54 AM   Modules accepted: Orders

## 2019-01-10 NOTE — Telephone Encounter (Addendum)
Referral placed. Forwarding to Catie as an Pharmacist, hospital.

## 2019-01-10 NOTE — Telephone Encounter (Signed)
I called and spoke to pateint and he is willing to meet with catie.  Nina,cma

## 2019-01-10 NOTE — Patient Instructions (Addendum)
Visit Information  Goals Addressed            This Visit's Progress     Patient Stated   . "I want to get my diabetes under control" (pt-stated)       Current Barriers:  . Diabetes: uncontrolled; most recent A1c 11.2% o Notes he has been without basal insulin for a few weeks now  . Current antihyperglycemic regimen: Novolog sliding scale  . Does report some dizziness upon standing, but also new "occular headaches" that also cause dizziness; wonders if related to BP . Current blood glucose readings: Widely variable, 100-300s . Cardiovascular risk reduction: o Current hypertensive regimen: amlodipine 10 mg daily, metoprolol succinate 50 mg daily; notes that nephrology recently added a new TID medication; reports SBP still in 150-180s; he has f/u with nephrology on Friday o Current hyperlipidemia regimen: rosuvastatin 10 mg daily; LDL 130, likely noncompliant   Pharmacist Clinical Goal(s):  Marland Kitchen Over the next 90 days, patient with work with PharmD and primary care provider to address optimized medication management  Interventions: . [facility]ed [facility] [place]; Lantus copay is $105/30 day supply. Recommended patient download coupon cards for Lantus and Novolog - will MyChart message these links to him.  . Discussed the importance of improved glycemic and BP management to prevent progression of renal disease. Patient verbalized understanding and noted that he does not want dialysis.  . Scheduled phone visit in 3 weeks for medication management . Patient requested refill on Nov[facility] [place]. Have done this, along with ample pen needles for daily Novolog and Lantus injections.   Patient Self Care Activities:  . Patient will check blood glucose TID , document, and provide at future appointments . Patient will take medications as prescribed . Patient will report any questions or concerns to provider   Initial goal documentation        The patient verbalized  understanding of instructions provided today and declined a print copy of patient instruction materials.   Plan: - Will outreach patient as scheduled for medication management review  Catie Darnelle Maffucci, PharmD, [facility] Pharmacist [facility] [phone]

## 2019-01-10 NOTE — Chronic Care Management (AMB) (Addendum)
Chronic Care Management   Note  01/10/2019 Name: Chase Clark MRN: [id number] DOB: Jan 03, 1966   Subjective:  Chase Clark is a 53 y.o. year old male who is a primary care patient of [name], [name], MD. The CCM team was consulted for assistance with chronic disease management and care coordination needs.    Contacted patient for medication access needs today.   Review of patient status, including review of consultants reports, laboratory and other test data, was performed as part of comprehensive evaluation and provision of chronic care management services.   Objective:  Lab Results  Component Value Date   CREATININE 2.42 (H) 12/31/2018   CREATININE 2.42 (H) 12/31/2018   CREATININE 2.77 (H) 12/21/2018    Lab Results  Component Value Date   HGBA1C 11.2 (H) 12/17/2018       Component Value Date/Time   CHOL 289 (H) 12/17/2018 0836   TRIG (H) 12/17/2018 0836    830.0 Triglyceride is over 400; calculations on Lipids are invalid.   HDL 36.30 (L) 12/17/2018 0836   CHOLHDL 8 12/17/2018 0836   VLDL 47.4 (H) 05/04/2017 1139   LDLCALC (H) 10/22/2006 1000    121 (NOTE)  Total Cholesterol/HDL Ratio:CHD Risk                       Coronary Heart Disease Risk Table                                       Men       Women         1/2 Average Risk              3.4        3.3             Average Risk              5.0         4.4         2 X Average Risk              9.6        7.1         3 X Average Risk             23.4       11.0 Use the calculated Patient Ratio above and the CHD Risk table  to determine the patient's CHD Risk. ATP III Classification (LDL):      < 100         mg/dL         Optimal     100 - 129     mg/dL         Near or Above Optimal     130 - 159     mg/dL         Borderline High     160 - 189     mg/dL         High      > 190        mg/dL         Very High   LDLDIRECT 130.0 12/17/2018 0836    Clinical ASCVD: Yes - PAD, atherosclerosis  BP Readings from Last 3  Encounters:  12/31/18 (!) 171/99  03/04/18 133/71  02/16/18 118/70    No Known Allergies  Medications Reviewed  Today    Reviewed by Lise Auer (Pharmacy Technician) on 12/31/18 at 2126  Med List Status: Complete  Medication Order Taking? Sig Documenting Provider Last Dose Status Informant  acetaminophen (TYLENOL) 325 MG tablet [id number] Yes Take 1 tablet (325 mg total) by mouth every 6 (six) hours as needed for mild pain (pain score 1-3 or temp > 100.5). Grier Mitts, PA-C Past Month Unknown time Active Multiple Informants  amLODipine (NORVASC) 10 MG tablet [id number] Yes Take 1 tablet (10 mg total) by mouth daily. [name], MD Past Week Unknown time Active Multiple Informants  aspirin EC 81 MG tablet [id number] Yes Take 81 mg by mouth daily. [provider] 12/30/2018 Unknown time Active Multiple Informants  brimonidine-timolol (COMBIGAN) 0.2-0.5 % ophthalmic solution [id number]  Place 1 drop into the right eye every 12 (twelve) hours.  [provider]  Active Multiple Informants           Med Note Ebony Hail, AMBER B   Fri Dec 31, 2018  9:14 PM) Needs refill  Continuous Blood Gluc Receiver ([place]) DEVI [id number]  1 each by Does not apply route as directed. Use to check blood glucose 5 times daily [name], MD  Active Multiple Informants  Continuous Blood Gluc Sensor ([place]) MISC [id number]  1 each by Does not apply route as directed. Place 1 sensor subcutaneously once every 10 days. [name], MD  Active Multiple Informants  Continuous Blood Gluc Transmit (DEXCOM G6 TRANSMITTER) MISC [id number]  1 each by Does not apply route as directed. Use to check blood glucose 5 times daily. Refill every 3 months. [name], MD  Active Multiple Informants  DUREZOL 0.05 % EMUL [id number]  Place 1 drop into the left eye at bedtime.  [provider]  Active Multiple Informants           Med Note Ebony Hail,  AMBER B   Fri Dec 31, 2018  9:25 PM) Needs refill  esomeprazole (NEXIUM) 20 MG capsule [id number] Yes Take 20 mg by mouth daily at 12 noon. [provider] 12/31/2018 Unknown time Active Multiple Informants       Patient not taking:      Discontinued 12/31/18 2124 (Dose change)   gabapentin (NEURONTIN) 300 MG capsule [id number] Yes Take 2 capsules (600 mg total) by mouth 2 (two) times daily.  Patient taking differently: Take 300-600 mg by mouth See admin instructions. Take 300mg  in the AM and 600mg  in the PM   [name], MD 12/31/2018 Unknown time Active Multiple Informants  insulin aspart (NOVOLOG) 100 UNIT/ML injection [id number] Yes Inject 15 Units into the skin 4 (four) times daily. [provider] 12/31/2018 Unknown time Active Multiple Informants       Patient not taking:      Discontinued 12/31/18 2124 (Change in therapy)   insulin lispro (HUMALOG KWIKPEN) 100 UNIT/ML KwikPen [id number] No Inject 0.2 mLs (20 Units total) into the skin 3 (three) times daily with meals.  Patient not taking: Reported on 12/31/2018   [name], MD Not Taking Unknown time Consider Medication Status and Discontinue (Cost of medication) Multiple Informants  ketoconazole (NIZORAL) 2 % cream [id number] Yes Apply 1 application topically 2 (two) times daily. [provider] 12/31/2018 Unknown time Active Multiple Informants  latanoprost (XALATAN) 0.005 % ophthalmic solution [id number]  Place 1 drop into both eyes at bedtime. [provider]  Active Multiple Informants  Med Note Ebony Hail, AMBER B   Fri Dec 31, 2018  9:23 PM) Needs refill        Discontinued 12/31/18 2123 (Patient Preference)   metoprolol tartrate (LOPRESSOR) 100 MG tablet [id number] Yes Take 0.5 tablets (50 mg total) by mouth 2 (two) times daily. [name], MD 12/29/2018 2200 Active Multiple Informants           Med Note Ebony Hail, AMBER B   Fri Dec 31, 2018  9:06 PM) AM 0700 PM 2200        Patient not taking:      Discontinued 12/31/18 2122 (Patient Preference)   rosuvastatin (CRESTOR) 10 MG tablet [id number] Yes Take 1 tablet (10 mg total) by mouth daily. [name], MD unk Active Multiple Informants           Med Note Ebony Hail, AMBER B   Fri Dec 31, 2018  9:22 PM) Wife thought pt was taken off this but pt thinks he is still taking it.            Assessment:   Goals Addressed            This Visit's Progress     Patient Stated   . "I want to get my diabetes under control" (pt-stated)       Current Barriers:  . Diabetes: uncontrolled; most recent A1c 11.2% o Notes he has been without basal insulin for a few weeks now  . Current antihyperglycemic regimen: Novolog sliding scale  . Does report some dizziness upon standing, but also new "occular headaches" that also cause dizziness; wonders if related to BP . Current blood glucose readings: Widely variable, 100-300s . Cardiovascular risk reduction: o Current hypertensive regimen: amlodipine 10 mg daily, metoprolol succinate 50 mg daily; notes that nephrology recently added a new TID medication; reports SBP still in 150-180s; he has f/u with nephrology on Friday o Current hyperlipidemia regimen: rosuvastatin 10 mg daily; LDL 130, likely noncompliant   Pharmacist Clinical Goal(s):  Marland Kitchen Over the next 90 days, patient with work with PharmD and primary care provider to address optimized medication management  Interventions: . [facility]ed [facility] [place]; Lantus copay is $105/30 day supply. Recommended patient download coupon cards for Lantus and Novolog - will MyChart message these links to him.  . Discussed the importance of improved glycemic and BP management to prevent progression of renal disease. Patient verbalized understanding and noted that he does not want dialysis.  . Scheduled phone visit in 3 weeks for medication management . Patient requested refill on [facility] [place]. Have done this,  along with ample pen needles for daily Novolog and Lantus injections.   Patient Self Care Activities:  . Patient will check blood glucose TID , document, and provide at future appointments . Patient will take medications as prescribed . Patient will report any questions or concerns to provider   Initial goal documentation        Plan: - Will outreach patient as scheduled for medication management review  Catie Darnelle Maffucci, PharmD, [place] Pharmacist [place] [phone]

## 2019-01-10 NOTE — Addendum Note (Signed)
Addended by: De Hollingshead on: 01/10/2019 11:39 AM   Modules accepted: Orders

## 2019-01-11 DIAGNOSIS — N183 Chronic kidney disease, stage 3 (moderate): Secondary | ICD-10-CM | POA: Diagnosis not present

## 2019-01-12 NOTE — Telephone Encounter (Signed)
I already spoke with patient and he agreed to see catie and they have already spoken.  nina,cma

## 2019-01-13 ENCOUNTER — Other Ambulatory Visit: Payer: Self-pay | Admitting: Family Medicine

## 2019-01-14 ENCOUNTER — Other Ambulatory Visit: Payer: Self-pay

## 2019-01-14 DIAGNOSIS — R809 Proteinuria, unspecified: Secondary | ICD-10-CM | POA: Diagnosis not present

## 2019-01-14 DIAGNOSIS — N183 Chronic kidney disease, stage 3 unspecified: Secondary | ICD-10-CM | POA: Diagnosis not present

## 2019-01-14 DIAGNOSIS — I129 Hypertensive chronic kidney disease with stage 1 through stage 4 chronic kidney disease, or unspecified chronic kidney disease: Secondary | ICD-10-CM | POA: Diagnosis not present

## 2019-01-14 DIAGNOSIS — N179 Acute kidney failure, unspecified: Secondary | ICD-10-CM | POA: Diagnosis not present

## 2019-01-17 ENCOUNTER — Other Ambulatory Visit (HOSPITAL_COMMUNITY): Payer: Self-pay | Admitting: Nephrology

## 2019-01-17 ENCOUNTER — Ambulatory Visit: Payer: BC Managed Care – PPO | Admitting: Family Medicine

## 2019-01-17 DIAGNOSIS — R809 Proteinuria, unspecified: Secondary | ICD-10-CM

## 2019-01-19 ENCOUNTER — Encounter (HOSPITAL_COMMUNITY)
Admission: EM | Disposition: A | Discharge status: Home / Self Care | Payer: Self-pay | Source: Home / Self Care | Attending: Cardiology

## 2019-01-19 ENCOUNTER — Other Ambulatory Visit: Payer: Self-pay

## 2019-01-19 ENCOUNTER — Inpatient Hospital Stay (HOSPITAL_COMMUNITY): Payer: BC Managed Care – PPO

## 2019-01-19 ENCOUNTER — Inpatient Hospital Stay (HOSPITAL_COMMUNITY)
Admission: EM | Admit: 2019-01-19 | Discharge: 2019-01-31 | DRG: 234 | Disposition: A | Discharge status: Home / Self Care | Payer: BC Managed Care – PPO | Attending: Thoracic Surgery (Cardiothoracic Vascular Surgery) | Admitting: Thoracic Surgery (Cardiothoracic Vascular Surgery)

## 2019-01-19 ENCOUNTER — Encounter (HOSPITAL_COMMUNITY): Payer: Self-pay

## 2019-01-19 DIAGNOSIS — E669 Obesity, unspecified: Secondary | ICD-10-CM | POA: Diagnosis present

## 2019-01-19 DIAGNOSIS — N179 Acute kidney failure, unspecified: Secondary | ICD-10-CM | POA: Diagnosis not present

## 2019-01-19 DIAGNOSIS — K219 Gastro-esophageal reflux disease without esophagitis: Secondary | ICD-10-CM | POA: Diagnosis present

## 2019-01-19 DIAGNOSIS — I2510 Atherosclerotic heart disease of native coronary artery without angina pectoris: Secondary | ICD-10-CM | POA: Diagnosis present

## 2019-01-19 DIAGNOSIS — I340 Nonrheumatic mitral (valve) insufficiency: Secondary | ICD-10-CM | POA: Diagnosis not present

## 2019-01-19 DIAGNOSIS — R072 Precordial pain: Secondary | ICD-10-CM | POA: Diagnosis not present

## 2019-01-19 DIAGNOSIS — E1139 Type 2 diabetes mellitus with other diabetic ophthalmic complication: Secondary | ICD-10-CM | POA: Diagnosis not present

## 2019-01-19 DIAGNOSIS — E1151 Type 2 diabetes mellitus with diabetic peripheral angiopathy without gangrene: Secondary | ICD-10-CM | POA: Diagnosis present

## 2019-01-19 DIAGNOSIS — Z8249 Family history of ischemic heart disease and other diseases of the circulatory system: Secondary | ICD-10-CM | POA: Diagnosis not present

## 2019-01-19 DIAGNOSIS — E11319 Type 2 diabetes mellitus with unspecified diabetic retinopathy without macular edema: Secondary | ICD-10-CM | POA: Diagnosis present

## 2019-01-19 DIAGNOSIS — I200 Unstable angina: Secondary | ICD-10-CM | POA: Diagnosis present

## 2019-01-19 DIAGNOSIS — I130 Hypertensive heart and chronic kidney disease with heart failure and stage 1 through stage 4 chronic kidney disease, or unspecified chronic kidney disease: Secondary | ICD-10-CM | POA: Diagnosis present

## 2019-01-19 DIAGNOSIS — Z833 Family history of diabetes mellitus: Secondary | ICD-10-CM

## 2019-01-19 DIAGNOSIS — K5900 Constipation, unspecified: Secondary | ICD-10-CM | POA: Diagnosis present

## 2019-01-19 DIAGNOSIS — Z951 Presence of aortocoronary bypass graft: Secondary | ICD-10-CM

## 2019-01-19 DIAGNOSIS — I371 Nonrheumatic pulmonary valve insufficiency: Secondary | ICD-10-CM | POA: Diagnosis not present

## 2019-01-19 DIAGNOSIS — I10 Essential (primary) hypertension: Secondary | ICD-10-CM | POA: Diagnosis present

## 2019-01-19 DIAGNOSIS — Z6834 Body mass index (BMI) 34.0-34.9, adult: Secondary | ICD-10-CM

## 2019-01-19 DIAGNOSIS — E1169 Type 2 diabetes mellitus with other specified complication: Secondary | ICD-10-CM | POA: Diagnosis present

## 2019-01-19 DIAGNOSIS — Z794 Long term (current) use of insulin: Secondary | ICD-10-CM | POA: Diagnosis not present

## 2019-01-19 DIAGNOSIS — R0602 Shortness of breath: Secondary | ICD-10-CM | POA: Diagnosis not present

## 2019-01-19 DIAGNOSIS — E1165 Type 2 diabetes mellitus with hyperglycemia: Secondary | ICD-10-CM | POA: Diagnosis not present

## 2019-01-19 DIAGNOSIS — N19 Unspecified kidney failure: Secondary | ICD-10-CM

## 2019-01-19 DIAGNOSIS — E7800 Pure hypercholesterolemia, unspecified: Secondary | ICD-10-CM | POA: Diagnosis not present

## 2019-01-19 DIAGNOSIS — N644 Mastodynia: Secondary | ICD-10-CM | POA: Diagnosis present

## 2019-01-19 DIAGNOSIS — R079 Chest pain, unspecified: Secondary | ICD-10-CM | POA: Diagnosis not present

## 2019-01-19 DIAGNOSIS — E785 Hyperlipidemia, unspecified: Secondary | ICD-10-CM | POA: Diagnosis not present

## 2019-01-19 DIAGNOSIS — K760 Fatty (change of) liver, not elsewhere classified: Secondary | ICD-10-CM | POA: Diagnosis present

## 2019-01-19 DIAGNOSIS — I129 Hypertensive chronic kidney disease with stage 1 through stage 4 chronic kidney disease, or unspecified chronic kidney disease: Secondary | ICD-10-CM | POA: Diagnosis not present

## 2019-01-19 DIAGNOSIS — Z20828 Contact with and (suspected) exposure to other viral communicable diseases: Secondary | ICD-10-CM | POA: Diagnosis present

## 2019-01-19 DIAGNOSIS — I25110 Atherosclerotic heart disease of native coronary artery with unstable angina pectoris: Secondary | ICD-10-CM | POA: Diagnosis not present

## 2019-01-19 DIAGNOSIS — E1122 Type 2 diabetes mellitus with diabetic chronic kidney disease: Secondary | ICD-10-CM | POA: Diagnosis present

## 2019-01-19 DIAGNOSIS — Z89512 Acquired absence of left leg below knee: Secondary | ICD-10-CM

## 2019-01-19 DIAGNOSIS — I25119 Atherosclerotic heart disease of native coronary artery with unspecified angina pectoris: Secondary | ICD-10-CM | POA: Diagnosis not present

## 2019-01-19 DIAGNOSIS — Z4682 Encounter for fitting and adjustment of non-vascular catheter: Secondary | ICD-10-CM | POA: Diagnosis not present

## 2019-01-19 DIAGNOSIS — N049 Nephrotic syndrome with unspecified morphologic changes: Secondary | ICD-10-CM | POA: Diagnosis not present

## 2019-01-19 DIAGNOSIS — I50810 Right heart failure, unspecified: Secondary | ICD-10-CM | POA: Diagnosis present

## 2019-01-19 DIAGNOSIS — Z955 Presence of coronary angioplasty implant and graft: Secondary | ICD-10-CM | POA: Diagnosis not present

## 2019-01-19 DIAGNOSIS — Z01810 Encounter for preprocedural cardiovascular examination: Secondary | ICD-10-CM | POA: Diagnosis not present

## 2019-01-19 DIAGNOSIS — J90 Pleural effusion, not elsewhere classified: Secondary | ICD-10-CM

## 2019-01-19 DIAGNOSIS — I152 Hypertension secondary to endocrine disorders: Secondary | ICD-10-CM | POA: Diagnosis present

## 2019-01-19 DIAGNOSIS — I213 ST elevation (STEMI) myocardial infarction of unspecified site: Secondary | ICD-10-CM | POA: Diagnosis not present

## 2019-01-19 DIAGNOSIS — Z79899 Other long term (current) drug therapy: Secondary | ICD-10-CM | POA: Diagnosis not present

## 2019-01-19 DIAGNOSIS — I25118 Atherosclerotic heart disease of native coronary artery with other forms of angina pectoris: Secondary | ICD-10-CM | POA: Diagnosis not present

## 2019-01-19 DIAGNOSIS — I2129 ST elevation (STEMI) myocardial infarction involving other sites: Secondary | ICD-10-CM | POA: Diagnosis not present

## 2019-01-19 DIAGNOSIS — I081 Rheumatic disorders of both mitral and tricuspid valves: Secondary | ICD-10-CM | POA: Diagnosis not present

## 2019-01-19 DIAGNOSIS — N183 Chronic kidney disease, stage 3 unspecified: Secondary | ICD-10-CM | POA: Diagnosis not present

## 2019-01-19 DIAGNOSIS — Z87891 Personal history of nicotine dependence: Secondary | ICD-10-CM

## 2019-01-19 DIAGNOSIS — I208 Other forms of angina pectoris: Secondary | ICD-10-CM | POA: Diagnosis not present

## 2019-01-19 DIAGNOSIS — R339 Retention of urine, unspecified: Secondary | ICD-10-CM | POA: Diagnosis present

## 2019-01-19 DIAGNOSIS — H548 Legal blindness, as defined in USA: Secondary | ICD-10-CM | POA: Diagnosis present

## 2019-01-19 DIAGNOSIS — Z7982 Long term (current) use of aspirin: Secondary | ICD-10-CM | POA: Diagnosis not present

## 2019-01-19 DIAGNOSIS — N184 Chronic kidney disease, stage 4 (severe): Secondary | ICD-10-CM | POA: Diagnosis present

## 2019-01-19 DIAGNOSIS — D631 Anemia in chronic kidney disease: Secondary | ICD-10-CM | POA: Diagnosis present

## 2019-01-19 DIAGNOSIS — E8770 Fluid overload, unspecified: Secondary | ICD-10-CM | POA: Diagnosis not present

## 2019-01-19 DIAGNOSIS — E1159 Type 2 diabetes mellitus with other circulatory complications: Secondary | ICD-10-CM | POA: Diagnosis present

## 2019-01-19 DIAGNOSIS — J9811 Atelectasis: Secondary | ICD-10-CM | POA: Diagnosis not present

## 2019-01-19 LAB — URINALYSIS, ROUTINE W REFLEX MICROSCOPIC
Bacteria, UA: NONE SEEN
Bilirubin Urine: NEGATIVE
Glucose, UA: 50 mg/dL — AB
Hgb urine dipstick: NEGATIVE
Ketones, ur: NEGATIVE mg/dL
Leukocytes,Ua: NEGATIVE
Nitrite: NEGATIVE
Protein, ur: >=300 mg/dL — AB
Specific Gravity, Urine: 1.007 (ref 1.005–1.030)
pH: 5 (ref 5.0–8.0)

## 2019-01-19 LAB — ECHOCARDIOGRAM COMPLETE
Height: 70 in
Weight: 3968 oz

## 2019-01-19 LAB — TROPONIN I (HIGH SENSITIVITY)
Troponin I (High Sensitivity): 10 ng/L (ref <18)
Troponin I (High Sensitivity): 10 ng/L (ref <18)

## 2019-01-19 LAB — BASIC METABOLIC PANEL
Anion gap: 10 (ref 5–15)
BUN: 28 mg/dL — ABNORMAL HIGH (ref 6–20)
CO2: 23 mmol/L (ref 22–32)
Calcium: 9 mg/dL (ref 8.9–10.3)
Chloride: 106 mmol/L (ref 98–111)
Creatinine, Ser: 2.84 mg/dL — ABNORMAL HIGH (ref 0.61–1.24)
GFR calc Af Amer: 28 mL/min — ABNORMAL LOW (ref >60)
GFR calc non Af Amer: 24 mL/min — ABNORMAL LOW (ref >60)
Glucose, Bld: 102 mg/dL — ABNORMAL HIGH (ref 70–99)
Potassium: 4.5 mmol/L (ref 3.5–5.1)
Sodium: 139 mmol/L (ref 135–145)

## 2019-01-19 LAB — PROTIME-INR
INR: 1.1 (ref 0.8–1.2)
Prothrombin Time: 14.1 seconds (ref 11.4–15.2)

## 2019-01-19 LAB — COMPREHENSIVE METABOLIC PANEL
ALT: 16 U/L (ref 0–44)
AST: 21 U/L (ref 15–41)
Albumin: 2.8 g/dL — ABNORMAL LOW (ref 3.5–5.0)
Alkaline Phosphatase: 64 U/L (ref 38–126)
Anion gap: 8 (ref 5–15)
BUN: 28 mg/dL — ABNORMAL HIGH (ref 6–20)
CO2: 25 mmol/L (ref 22–32)
Calcium: 8.5 mg/dL — ABNORMAL LOW (ref 8.9–10.3)
Chloride: 105 mmol/L (ref 98–111)
Creatinine, Ser: 2.89 mg/dL — ABNORMAL HIGH (ref 0.61–1.24)
GFR calc Af Amer: 27 mL/min — ABNORMAL LOW (ref >60)
GFR calc non Af Amer: 23 mL/min — ABNORMAL LOW (ref >60)
Glucose, Bld: 97 mg/dL (ref 70–99)
Potassium: 4.4 mmol/L (ref 3.5–5.1)
Sodium: 138 mmol/L (ref 135–145)
Total Bilirubin: 1.2 mg/dL (ref 0.3–1.2)
Total Protein: 6 g/dL — ABNORMAL LOW (ref 6.5–8.1)

## 2019-01-19 LAB — GLUCOSE, CAPILLARY: Glucose-Capillary: 163 mg/dL — ABNORMAL HIGH (ref 70–99)

## 2019-01-19 LAB — CBC
HCT: 36.8 % — ABNORMAL LOW (ref 39.0–52.0)
Hemoglobin: 11.6 g/dL — ABNORMAL LOW (ref 13.0–17.0)
MCH: 29.4 pg (ref 26.0–34.0)
MCHC: 31.5 g/dL (ref 30.0–36.0)
MCV: 93.2 fL (ref 80.0–100.0)
Platelets: 197 10*3/uL (ref 150–400)
RBC: 3.95 MIL/uL — ABNORMAL LOW (ref 4.22–5.81)
RDW: 13.8 % (ref 11.5–15.5)
WBC: 8.4 10*3/uL (ref 4.0–10.5)
nRBC: 0 % (ref 0.0–0.2)

## 2019-01-19 LAB — CBG MONITORING, ED
Glucose-Capillary: 100 mg/dL — ABNORMAL HIGH (ref 70–99)
Glucose-Capillary: 90 mg/dL (ref 70–99)
Glucose-Capillary: 82 mg/dL (ref 70–99)
Glucose-Capillary: 172 mg/dL — ABNORMAL HIGH (ref 70–99)

## 2019-01-19 LAB — LIPID PANEL
Cholesterol: 145 mg/dL (ref 0–200)
HDL: 43 mg/dL (ref >40)
LDL Cholesterol: 72 mg/dL (ref 0–99)
Total CHOL/HDL Ratio: 3.4 RATIO
Triglycerides: 151 mg/dL — ABNORMAL HIGH (ref <150)
VLDL: 30 mg/dL (ref 0–40)

## 2019-01-19 LAB — SARS CORONAVIRUS 2 BY RT PCR (HOSPITAL ORDER, PERFORMED IN ~~LOC~~ HOSPITAL LAB): SARS Coronavirus 2: NEGATIVE

## 2019-01-19 LAB — HEPARIN LEVEL (UNFRACTIONATED): Heparin Unfractionated: 0.23 IU/mL — ABNORMAL LOW (ref 0.30–0.70)

## 2019-01-19 LAB — HEMOGLOBIN A1C
Hgb A1c MFr Bld: 9.3 % — ABNORMAL HIGH (ref 4.8–5.6)
Mean Plasma Glucose: 220.21 mg/dL

## 2019-01-19 LAB — APTT: aPTT: 30 seconds (ref 24–36)

## 2019-01-19 SURGERY — LEFT HEART CATH AND CORONARY ANGIOGRAPHY
Anesthesia: LOCAL

## 2019-01-19 MED ORDER — GABAPENTIN 300 MG PO CAPS
600.0000 mg | ORAL_CAPSULE | Freq: Every day | ORAL | Status: DC
  Administered 2019-01-19: 600 mg via ORAL
  Filled 2019-01-19: qty 2

## 2019-01-19 MED ORDER — HEPARIN (PORCINE) 25000 UT/250ML-% IV SOLN
1550.0000 [IU]/h | INTRAVENOUS | Status: DC
  Administered 2019-01-19: 1350 [IU]/h via INTRAVENOUS
  Filled 2019-01-19 (×2): qty 250

## 2019-01-19 MED ORDER — AMLODIPINE BESYLATE 10 MG PO TABS
10.0000 mg | ORAL_TABLET | Freq: Every day | ORAL | Status: DC
  Administered 2019-01-20 – 2019-01-26 (×6): 10 mg via ORAL
  Filled 2019-01-19 (×6): qty 1
  Filled 2019-01-19: qty 2

## 2019-01-19 MED ORDER — SODIUM CHLORIDE 0.9 % IV SOLN
INTRAVENOUS | Status: DC
  Administered 2019-01-19 – 2019-01-20 (×2): via INTRAVENOUS

## 2019-01-19 MED ORDER — FUROSEMIDE 10 MG/ML IJ SOLN
80.0000 mg | Freq: Once | INTRAMUSCULAR | Status: AC
  Administered 2019-01-19: 80 mg via INTRAVENOUS
  Filled 2019-01-19: qty 8

## 2019-01-19 MED ORDER — SODIUM CHLORIDE 0.9% FLUSH
3.0000 mL | Freq: Two times a day (BID) | INTRAVENOUS | Status: DC
  Administered 2019-01-19: 3 mL via INTRAVENOUS

## 2019-01-19 MED ORDER — SODIUM CHLORIDE 0.9% FLUSH: 3.0000 mL | INTRAVENOUS | Status: DC | PRN

## 2019-01-19 MED ORDER — HEPARIN SODIUM (PORCINE) 5000 UNIT/ML IJ SOLN
4000.0000 [IU] | Freq: Once | INTRAMUSCULAR | Status: AC
  Administered 2019-01-19: 4000 [IU] via INTRAVENOUS

## 2019-01-19 MED ORDER — ASPIRIN EC 81 MG PO TBEC
81.0000 mg | DELAYED_RELEASE_TABLET | Freq: Every day | ORAL | Status: DC
  Administered 2019-01-20 – 2019-01-26 (×8): 81 mg via ORAL
  Filled 2019-01-19 (×9): qty 1

## 2019-01-19 MED ORDER — METOPROLOL TARTRATE 50 MG PO TABS
50.0000 mg | ORAL_TABLET | Freq: Two times a day (BID) | ORAL | Status: DC
  Administered 2019-01-19 – 2019-01-26 (×14): 50 mg via ORAL
  Filled 2019-01-19 (×11): qty 1
  Filled 2019-01-19: qty 2
  Filled 2019-01-19 (×3): qty 1

## 2019-01-19 MED ORDER — ASPIRIN 81 MG PO CHEW
324.0000 mg | CHEWABLE_TABLET | Freq: Once | ORAL | Status: AC
  Administered 2019-01-19: 324 mg via ORAL
  Filled 2019-01-19: qty 4

## 2019-01-19 MED ORDER — SODIUM CHLORIDE 0.9 % IV SOLN: 250.0000 mL | INTRAVENOUS | Status: DC | PRN

## 2019-01-19 MED ORDER — GABAPENTIN 300 MG PO CAPS
300.0000 mg | ORAL_CAPSULE | Freq: Every day | ORAL | Status: DC
  Administered 2019-01-20 – 2019-01-26 (×6): 300 mg via ORAL
  Filled 2019-01-19 (×6): qty 1

## 2019-01-19 MED ORDER — SODIUM CHLORIDE 0.9% FLUSH: 3.0000 mL | Freq: Once | INTRAVENOUS | Status: DC

## 2019-01-19 MED ORDER — FUROSEMIDE 10 MG/ML IJ SOLN: 80.0000 mg | Freq: Once | INTRAMUSCULAR | Status: DC

## 2019-01-19 MED ORDER — FUROSEMIDE 10 MG/ML IJ SOLN
80.0000 mg | Freq: Once | INTRAMUSCULAR | Status: AC
  Administered 2019-01-19: 80 mg via INTRAVENOUS

## 2019-01-19 MED ORDER — GABAPENTIN 300 MG PO CAPS: 300.0000 mg | ORAL_CAPSULE | Freq: Two times a day (BID) | ORAL | Status: DC

## 2019-01-19 MED ORDER — ROSUVASTATIN CALCIUM 20 MG PO TABS
40.0000 mg | ORAL_TABLET | Freq: Every day | ORAL | Status: DC
  Administered 2019-01-19 – 2019-01-23 (×5): 40 mg via ORAL
  Filled 2019-01-19 (×5): qty 2

## 2019-01-19 MED ORDER — INSULIN ASPART 100 UNIT/ML ~~LOC~~ SOLN
0.0000 [IU] | Freq: Three times a day (TID) | SUBCUTANEOUS | Status: DC
  Administered 2019-01-19 – 2019-01-20 (×3): 3 [IU] via SUBCUTANEOUS
  Administered 2019-01-21: 8 [IU] via SUBCUTANEOUS
  Administered 2019-01-21: 2 [IU] via SUBCUTANEOUS
  Administered 2019-01-22 – 2019-01-23 (×4): 3 [IU] via SUBCUTANEOUS
  Administered 2019-01-23: 2 [IU] via SUBCUTANEOUS
  Administered 2019-01-23: 3 [IU] via SUBCUTANEOUS
  Administered 2019-01-24: 11 [IU] via SUBCUTANEOUS
  Administered 2019-01-24 – 2019-01-26 (×4): 3 [IU] via SUBCUTANEOUS
  Administered 2019-01-26: 5 [IU] via SUBCUTANEOUS
  Administered 2019-01-26: 3 [IU] via SUBCUTANEOUS

## 2019-01-19 NOTE — Progress Notes (Signed)
ANTICOAGULATION CONSULT NOTE - Follow-Up Consult  Pharmacy Consult for Heparin  Indication: chest pain/ACS  No Known Allergies  Patient Measurements: Height: 5\' 10"  (177.8 cm) Weight: 248 lb (112.5 kg) IBW/kg (Calculated) : 73 Heparin Dosing Weight: 97.6 kg  Vital Signs: Temp: 98.6 F (37 C) (10/07 0818) Temp Source: Oral (10/07 0818) BP: 119/64 (10/07 1700) Pulse Rate: 65 (10/07 1700)  Labs: Recent Labs    01/19/19 0833 01/19/19 0843 01/19/19 0923 01/19/19 1700  HGB 11.6*  --   --   --   HCT 36.8*  --   --   --   PLT 197  --   --   --   APTT  --  30  --   --   LABPROT  --  14.1  --   --   INR  --  1.1  --   --   HEPARINUNFRC  --   --   --  0.23*  CREATININE 2.84*  --  2.89*  --   TROPONINIHS 10  --  10  --     Estimated Creatinine Clearance: 36.3 mL/min (A) (by C-G formula based on SCr of 2.89 mg/dL (H)).   Assessment: 55 YOM with known CAD (stent 2011), CKD, DM, HTN, HLD presented to [facility] ED with swelling, SOB, and chest pressure with pain to Left arm. No immediate plans for cath, will medically manage with IV heparin for now. Not on anticoagulants prior to admission.  Heparin level this evening resulted as SUBtherapeutic (HL 0.23, goal of 0.3-0.7). No bleeding noted per discussion with RN.  Goal of Therapy:  Heparin level 0.3-0.7 units/ml Monitor platelets by anticoagulation protocol: Yes   Plan:  - Increase Heparin to 1550 units/hr (15.5 ml/hr) - Will continue to monitor for any signs/symptoms of bleeding and will follow up with heparin level in 6 hours   Thank you for allowing pharmacy to be a part of this patient's care.  Alycia Rossetti, PharmD, BCPS Clinical Pharmacist Clinical phone for 01/19/2019: [phone] 01/19/2019 6:05 PM   **Pharmacist phone directory can now be found on [email or web address] ([email or web address]).  Listed under [place].

## 2019-01-19 NOTE — ED Triage Notes (Addendum)
Patient complains of increased swelling and increased SOB for the past few days, to have kidney biopsy soon due to kidney insufficiency/ failure. Also complains of chest pressure with pain to left arm. Swelling worse to abdomen, exertional SOB

## 2019-01-19 NOTE — ED Notes (Signed)
Lunch tray ordered 

## 2019-01-19 NOTE — Consult Note (Signed)
[facility] Renal Consultation Note  Requesting MD: Peter Martinique, MD Indication for Consultation:  CKD   Chief complaint:  Chest pain and swelling  HPI:  Chase Clark is a 53 y.o. male with a history of coronary artery disease, diabetes, hypertension and CKD with nephrotic syndrome who had been referred for renal biopsy recently who presented to the ER with worsening swelling and left arm/shoulder pain and what felt like a toothache.  No n/v but has had some indigestion.  A code STEMI was called as his initial EKG had slight ST elevation laterally and inferior depression.  The patient's pain resolved and repeat EKG was acceptable per cardiology.  Spoke with cardiology attending, Dr. Martinique.  Cardiology is planning on a heart cath likely tomorrow.  The code STEMI was canceled.  States that his swelling hadn't gotten better with lasix 40 mg daily and had just increased to 80 mg daily yesterday.  He is known to me from clinic at [facility].  He states has a stent but has never had an MI.  Blood pressure has been improving after increasing hydralazine- yesterday was 130/85 at home.    Lab history per clinic:  01/11/19 - Cr 2.67, eGFR 26, Bun 30, K 5.2, bicarb 23, phos 4.4; pth 53, vit D 19; up/cr ratio 9253 mg/g and UA 3+ protein, 2+ glu, 0-2 RBC 01/07/19 - Cr 2.33, BUN 24, eGFR 30, K 4.7; UA with 3+ protein, 2+ blood, 1+ glucose and 3-10 RBC; ANA positive 1:320 homogenous speckled; ANCA neg; complement normal; up/cr ratio 13155 mg/g; SPEP no M spike; upep 1+ M spike with no evidence of monoclonal protein, unremarkable IF pattern; free light chain ratio 1.23 12/31/18 - Cr 2.42, BUN 23, K 4.7, eGFR 29/34, Hb 14.3; UA > 500 mg/dL glucose, > 300 mg/dL protein and 0-5 RBC 12/21/18 - Cr 2.77, eGFR 24 12/17/18 - Cr 2.60, glu 506, K 4.1 11/2017 - A1c 10.1 09/2017 - Cr 1.65 and Cr 1.79 08/2017 - hosp with Cr 1.68 - 2.22; earlier hosp 08/2017 with Cr mid 1's (?hydrated) after peak of 1.82 that  admit  Creatinine, Ser  Date/Time Value Ref Range Status  01/19/2019 08:33 AM 2.84 (H) 0.61 - 1.24 mg/dL Final  12/31/2018 05:58 PM 2.42 (H) 0.61 - 1.24 mg/dL Final  12/21/2018 03:24 PM 2.77 (H) 0.40 - 1.50 mg/dL Final  12/17/2018 08:36 AM 2.60 (H) 0.40 - 1.50 mg/dL Final  09/30/2017 12:07 PM 1.65 (H) 0.40 - 1.50 mg/dL Final  09/15/2017 05:32 AM 1.79 (H) 0.61 - 1.24 mg/dL Final  09/10/2017 05:56 AM 1.64 (H) 0.61 - 1.24 mg/dL Final  09/09/2017 04:43 AM 1.70 (H) 0.61 - 1.24 mg/dL Final  09/08/2017 04:53 AM 1.83 (H) 0.61 - 1.24 mg/dL Final  09/07/2017 01:26 AM 2.09 (H) 0.61 - 1.24 mg/dL Final  09/06/2017 03:27 AM 1.97 (H) 0.61 - 1.24 mg/dL Final  09/05/2017 06:25 AM 1.68 (H) 0.61 - 1.24 mg/dL Final  09/04/2017 05:45 PM 2.22 (H) 0.61 - 1.24 mg/dL Final  08/28/2017 06:05 AM 1.51 (H) 0.61 - 1.24 mg/dL Final  08/27/2017 06:08 AM 1.55 (H) 0.61 - 1.24 mg/dL Final  08/26/2017 05:43 AM 1.05 0.61 - 1.24 mg/dL Final  08/25/2017 08:07 AM 1.08 0.61 - 1.24 mg/dL Final  08/25/2017 04:52 AM 1.20 0.61 - 1.24 mg/dL Final  08/25/2017 12:24 AM 1.37 (H) 0.61 - 1.24 mg/dL Final  08/24/2017 08:04 PM 1.30 (H) 0.61 - 1.24 mg/dL Final  08/24/2017 04:10 PM 1.29 (H) 0.61 - 1.24 mg/dL Final  08/24/2017 12:23 PM 1.26 (H) 0.61 - 1.24 mg/dL Final  08/24/2017 08:54 AM 1.49 (H) 0.61 - 1.24 mg/dL Final  08/24/2017 04:39 AM 1.39 (H) 0.61 - 1.24 mg/dL Final  08/24/2017 04:39 AM 1.43 (H) 0.61 - 1.24 mg/dL Final  08/24/2017 12:53 AM 1.58 (H) 0.61 - 1.24 mg/dL Final  08/23/2017 09:28 PM 1.82 (H) 0.61 - 1.24 mg/dL Final  08/23/2017 02:07 PM 2.34 (H) 0.61 - 1.24 mg/dL Final  05/07/2017 12:12 PM 1.62 (H) 0.61 - 1.24 mg/dL Final  05/04/2017 11:39 AM 1.69 (H) 0.40 - 1.50 mg/dL Final  04/30/2017 05:40 AM 1.83 (H) 0.61 - 1.24 mg/dL Final  04/29/2017 07:05 PM 2.01 (H) 0.61 - 1.24 mg/dL Final  04/29/2017 09:50 AM 2.10 (H) 0.40 - 1.50 mg/dL Final  04/03/2017 09:30 AM 1.32 0.40 - 1.50 mg/dL Final  08/21/2016 09:00 AM 1.29 (H) 0.61  - 1.24 mg/dL Final  08/05/2016 08:45 AM 1.28 (H) 0.61 - 1.24 mg/dL Final  07/29/2016 08:45 AM 0.88 0.61 - 1.24 mg/dL Final  07/22/2016 08:30 AM 1.02 0.61 - 1.24 mg/dL Final  07/09/2016 08:25 AM 0.85 0.61 - 1.24 mg/dL Final  05/21/2016 06:04 AM 0.90 0.61 - 1.24 mg/dL Final  05/20/2016 04:54 AM 0.72 0.61 - 1.24 mg/dL Final  05/19/2016 12:13 AM 0.69 0.61 - 1.24 mg/dL Final  05/18/2016 11:12 AM 0.77 0.61 - 1.24 mg/dL Final  05/18/2016 05:06 AM 0.85 0.61 - 1.24 mg/dL Final  05/18/2016 12:03 AM 1.00 0.61 - 1.24 mg/dL Final  05/17/2016 08:51 PM 1.15 0.61 - 1.24 mg/dL Final  05/17/2016 01:42 PM 1.34 (H) 0.61 - 1.24 mg/dL Final  09/05/2015 02:00 AM 1.22 0.61 - 1.24 mg/dL Final  09/04/2015 10:00 PM 1.46 (H) 0.61 - 1.24 mg/dL Final  01/06/2014 08:43 AM 1.0 0.4 - 1.5 mg/dL Final  06/27/2013 09:30 AM 0.9 0.4 - 1.5 mg/dL Final  10/25/2006 10:55 PM 0.8  Final     PMHx:   Past Medical History:  Diagnosis Date  . Arthritis   . Asthma    as child  . Coronary artery disease    DES DIAG1 11/20/09 [facility])  . Diabetes mellitus without complication ([place])   . Diabetic osteomyelitis ([place])   . Diabetic ulcer of foot with muscle involvement without evidence of necrosis ([place])    DIABETIC ULCERATIONS ASSOCIATED WITH IRRITATION LATERAL ANKLE LEFT GREATER THAN RIGHT WITH MILD CELLULITIS   . Diverticulosis   . DKA (diabetic ketoacidoses) ([place]) 08/23/2017  . Edema, lower extremity   . Fatty liver   . GERD (gastroesophageal reflux disease)   . Gilbert's disease   . Glaucoma   . Heart disease 2011   Patient has stent for 80% blockage  . Hyperlipidemia   . Hypertension   . Neuropathy   . Seasonal allergies   . Shoulder pain   . Ulcer of foot ([place]) 08.06.2014   DIABETIC ULCERATIONS ASSOCIATED WITH IRRITATION LATERAL ANKLE LEFT GREATER THAN RIGHT WITH MILD CELLULITIS    Past Surgical History:  Procedure Laterality Date  . ABDOMINAL AORTOGRAM N/A 05/20/2016   Procedure: Abdominal Aortogram possible  intervention;  Surgeon: Katha Cabal, MD;  Location: [facility];  Service: Cardiovascular;  Laterality: N/A;  . AMPUTATION Left 09/05/2017   Procedure: AMPUTATION BELOW KNEE;  Surgeon: Tania Ade, MD;  Location: [facility];  Service: Orthopedics;  Laterality: Left;  . ANTERIOR CERVICAL DECOMP/DISCECTOMY FUSION N/A 05/07/2017   Procedure: ANTERIOR CERVICAL DECOMPRESSION/DISCECTOMY Luevenia Maxin PROSTHESIS,PLATE/SCREWS CERVICAL FIVE - CERVICAL SIX;  Surgeon: Newman Pies, MD;  Location: [facility]  OR;  Service: Neurosurgery;  Laterality: N/A;  . CARDIAC CATHETERIZATION    . CATARACT EXTRACTION W/ INTRAOCULAR LENS IMPLANT    . CATARACT EXTRACTION W/PHACO Left 02/26/2017   Procedure: CATARACT EXTRACTION PHACO AND INTRAOCULAR LENS PLACEMENT (IOC);  Surgeon: Eulogio Bear, MD;  Location: [facility] ORS;  Service: Ophthalmology;  Laterality: Left;  Lot # [id number] H [place]: 00:25.3 AP%: 6.2 CDE: 1.59   . CHOLECYSTECTOMY N/A   . CORONARY ANGIOPLASTY WITH STENT PLACEMENT  2007   1st diagonal  . EPIBLEPHERON REPAIR WITH TEAR DUCT PROBING    . EYE SURGERY Right sept 29n2015   cataract extraction  . INSERTION EXPRESS TUBE SHUNT Right 09/06/2015   Procedure: INSERTION AHMED TUBE SHUNT with tutoplast allograft;  Surgeon: Eulogio Bear, MD;  Location: [facility] ORS;  Service: Ophthalmology;  Laterality: Right;  Marland Kitchen VASECTOMY      Family Hx:  Family History  Problem Relation Age of Onset  . Hyperlipidemia Mother   . Diabetes Mother   . Liver disease Mother        NASH  . Hyperlipidemia Father   . Diabetes Father   . Heart disease Father   . Hypertension Father     Social History:  reports that he quit smoking about 10 years ago. His smoking use included cigarettes. He has never used smokeless tobacco. He reports current alcohol use. He reports that he does not use drugs.  Allergies: No Known Allergies  Medications: Prior to Admission medications   Medication Sig Start Date End Date  Taking? Authorizing Provider  acetaminophen (TYLENOL) 325 MG tablet Take 1 tablet (325 mg total) by mouth every 6 (six) hours as needed for mild pain (pain score 1-3 or temp > 100.5). 09/06/17   Grier Mitts, PA-C  amLODipine (NORVASC) 10 MG tablet Take 1 tablet (10 mg total) by mouth daily. 07/19/18   [name], MD  aspirin EC 81 MG tablet Take 81 mg by mouth daily.    [provider]  brimonidine-timolol (COMBIGAN) 0.2-0.5 % ophthalmic solution Place 1 drop into the right eye every 12 (twelve) hours.     [provider]  Continuous Blood Gluc Receiver ([place]) [place] 1 each by Does not apply route as directed. Use to check blood glucose 5 times daily 08/26/17   [name], MD  Continuous Blood Gluc Sensor (DEXCOM G6 SENSOR) MISC 1 each by Does not apply route as directed. Place 1 sensor subcutaneously once every 10 days. 08/26/17   [name], MD  Continuous Blood Gluc Transmit (DEXCOM G6 TRANSMITTER) MISC 1 each by Does not apply route as directed. Use to check blood glucose 5 times daily. Refill every 3 months. 08/26/17   [name], MD  DUREZOL 0.05 % EMUL Place 1 drop into the left eye at bedtime.  03/27/17   [provider]  esomeprazole (NEXIUM) 20 MG capsule Take 20 mg by mouth daily at 12 noon.    [provider]  gabapentin (NEURONTIN) 300 MG capsule Take 2 capsules (600 mg total) by mouth 2 (two) times daily. Patient taking differently: Take 300-600 mg by mouth See admin instructions. Take 374m in the AM and 6060min the PM 07/19/18   SoLeone HavenMD  insulin aspart (NOVOLOG) 100 UNIT/ML injection Inject up to 15 units up to 3 times daily with meals 01/10/19   SoLeone HavenMD  Insulin Glargine (LANTUS) 100 UNIT/ML Solostar Pen Inject 60 Units into the skin 2 (two)  times daily. 01/10/19   [name], MD  Insulin Pen Needle 32G X 4 MM MISC Inject 5 pens into the skin daily. Use to inject Lantus  and Novolog up to 5 times daily 01/10/19   [name], MD  ketoconazole (NIZORAL) 2 % cream Apply 1 application topically 2 (two) times daily. 12/13/18   [provider]  latanoprost (XALATAN) 0.005 % ophthalmic solution Place 1 drop into both eyes at bedtime. 03/03/17   [provider]  meclizine (ANTIVERT) 12.5 MG tablet Take 1 tablet (12.5 mg total) by mouth 3 (three) times daily as needed for dizziness. 12/31/18   Valarie Merino, MD  metoprolol tartrate (LOPRESSOR) 100 MG tablet TAKE 0.5 TABLETS (50 MG TOTAL) BY MOUTH 2 (TWO) TIMES DAILY. 01/13/19   [name], MD  rosuvastatin (CRESTOR) 10 MG tablet Take 1 tablet (10 mg total) by mouth daily. 07/19/18   [name], MD    I have reviewed the patient's current medications.  Med list is being updated currently - he is also on hydralazine.   Labs:  BMP Latest Ref Rng & Units 01/19/2019 12/31/2018 12/31/2018  Glucose 70 - 99 mg/dL 102(H) 205(H) 166(H)  BUN 6 - 20 mg/dL 28(H) 23(H) 25  Creatinine 0.61 - 1.24 mg/dL 2.84(H) 2.42(H) 2.42(H)  BUN/Creat Ratio 6 - 22 (calc) - - 10  Sodium 135 - 145 mmol/L 139 132(L) 134(L)  Potassium 3.5 - 5.1 mmol/L 4.5 4.7 4.5  Chloride 98 - 111 mmol/L 106 102 102  CO2 22 - 32 mmol/L 23 20(L) 18(L)  Calcium 8.9 - 10.3 mg/dL 9.0 9.0 8.7    Urinalysis    Component Value Date/Time   COLORURINE STRAW (A) 12/31/2018 1855   APPEARANCEUR CLEAR 12/31/2018 1855   LABSPEC 1.005 12/31/2018 1855   PHURINE 6.0 12/31/2018 1855   GLUCOSEU >=500 (A) 12/31/2018 1855   HGBUR SMALL (A) 12/31/2018 1855   BILIRUBINUR NEGATIVE 12/31/2018 1855   KETONESUR NEGATIVE 12/31/2018 1855   PROTEINUR >=300 (A) 12/31/2018 1855   UROBILINOGEN 0.2 10/22/2006 0948   NITRITE NEGATIVE 12/31/2018 1855   LEUKOCYTESUR NEGATIVE 12/31/2018 1855     ROS:  Pertinent items noted in HPI and remainder of comprehensive ROS otherwise negative.   Physical Exam: Vitals:   01/19/19 0945 01/19/19 1000  BP: (!)  148/76 133/79  Pulse: 65 67  Resp: 11 10  Temp:    SpO2: 97% 98%     General: adult male in NAD at rest HEENT: NCAT  Eyes: EOMI; sclera anicteric Neck: supple trachea midline  Heart: RRR; no rub  Lungs: clear to auscultation; normal work of breathing at rest Abdomen: soft but distended ; normal bowel sounds Extremities: 2+ pitting edema bilaterally  Skin: no rash on extremities exposed  Neuro: alert and oriented x 3  Psych normal mood and affect GU no foley   Assessment/Plan:  # Chest pain with CAD - initially concerning for STEMI; note code stemi was cancelled - for heart cath with timing per cardiology discretion - spoke with cardiology attending   # Fluid volume overload  - nephrotic syndrome  - Lasix 80 mg IV once now   # AKI - Consider possible CKD progression as well - Had been planned for renal biopsy with concurrent nephrotic syndrome - Obtain renal ultrasound   # CKD stage III  - Possible progression to CKD IV vs AKI as above.  Note gap in labs from 2019 - he may have progressed to CKD stage  IV  - Likely secondary at least in part to DM as well as microvascular disease from HTN   # Proteinuria with nephrotic syndrome - Felt at least in part secondary to DM - ANA positive (was referred to rheum); ANCA ok and SPEP, UPEP with immunofixation acceptable, and free light chains acceptable - Note RAAS blockade off with worsening renal function - Defer empiric steroids for now with his acute presentation  - had referred for renal biopsy - he is on aspirin with concern for ACS on presentation   # Hematuria - ANA positive; ANCA and complement normal - hematuria resolved on repeat pre-appt labs on last eval   # HTN with CKD - Lasix as above  - Improved control on current regimen       Claudia Desanctis 01/19/2019, 10:12 AM

## 2019-01-19 NOTE — Progress Notes (Signed)
  Echocardiogram 2D Echocardiogram has been performed.  Tiffany G Dance 01/19/2019, 3:30 PM

## 2019-01-19 NOTE — ED Provider Notes (Signed)
Emergency Department Provider Note   I have reviewed the triage vital signs and the nursing notes.   HISTORY  Chief Complaint cp/ sob/ swelling   HPI Chase Clark is a 53 y.o. male with past medical history of CAD, diabetes, and CKD presents to the emergency department with progressively increasing lower extremity and abdominal edema with shortness of breath.  This morning, he developed a "toothache" type discomfort in his left posterior shoulder radiating down the left arm.  States the pain has been intermittent this morning and was present on arrival to the emergency department but has since decreased.  He does have history of DES placed in 2011 but was not having pain with that episode.  He denies any associated nausea, diaphoresis.  He is followed by cardiology in [place].  He has been taking his Lasix with little relief in his swelling.  No fevers, chills, cough.   Past Medical History:  Diagnosis Date  . Arthritis   . Asthma    as child  . Coronary artery disease    DES DIAG1 11/20/09 [facility])  . Diabetes mellitus without complication ([place])   . Diabetic osteomyelitis ([place])   . Diabetic ulcer of foot with muscle involvement without evidence of necrosis ([place])    DIABETIC ULCERATIONS ASSOCIATED WITH IRRITATION LATERAL ANKLE LEFT GREATER THAN RIGHT WITH MILD CELLULITIS   . Diverticulosis   . DKA (diabetic ketoacidoses) ([place]) 08/23/2017  . Edema, lower extremity   . Fatty liver   . GERD (gastroesophageal reflux disease)   . Gilbert's disease   . Glaucoma   . Heart disease 2011   Patient has stent for 80% blockage  . Hyperlipidemia   . Hypertension   . Neuropathy   . Seasonal allergies   . Shoulder pain   . Ulcer of foot ([place]) 08.06.2014   DIABETIC ULCERATIONS ASSOCIATED WITH IRRITATION LATERAL ANKLE LEFT GREATER THAN RIGHT WITH MILD CELLULITIS    Patient Active Problem List   Diagnosis Date Noted  . Chest pain 01/19/2019  . Nephrotic syndrome 01/19/2019  .  Unstable angina ([place]) 01/19/2019  . Allergic rhinitis 07/19/2018  . Abnormality of gait 12/02/2017  . Labile blood pressure   . Labile blood glucose   . Amputation of left lower extremity below knee ([place]) 09/09/2017  . Benign prostatic hyperplasia   . Hyperlipidemia   . Stage 3 chronic kidney disease   . Neuropathic pain   . Osteomyelitis of left foot ([place])   . Coronary artery disease involving native coronary artery of native heart without angina pectoris   . Anemia of chronic disease   . Leukocytosis   . Pressure injury of skin 08/24/2017  . Cervical spondylosis with myelopathy and radiculopathy 05/07/2017  . Renal failure 04/30/2017  . Fatty liver 04/04/2017  . DDD (degenerative disc disease), cervical 01/13/2017  . PAD (peripheral artery disease) ([place]) 08/07/2016  . Ulcer of ankle ([place]) 08/07/2016  . Chronic diarrhea 05/17/2016  . Hyponatremia 05/17/2016  . Anemia 01/12/2014  . Disorder of ligament of ankle 07/31/2013  . Edema 06/28/2013  . 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 Obesity (BMI 30-39.9) 06/12/2013  . Pain in joint, shoulder region 06/12/2013  . Statin intolerance 06/10/2013  . DM (diabetes mellitus), type 2, uncontrolled w/ophthalmic complication ([place]) 86/76/1952  . Hyperlipidemia associated with type 2 diabetes mellitus ([place]) 07/17/2006  . GILBERT'S SYNDROME 07/17/2006  . Essential hypertension 07/17/2006  . Coronary atherosclerosis 07/17/2006  . OSTEOARTHRITIS 07/17/2006  . COUGH 07/17/2006  Past Surgical History:  Procedure Laterality Date  . ABDOMINAL AORTOGRAM N/A 05/20/2016   Procedure: Abdominal Aortogram possible intervention;  Surgeon: Katha Cabal, MD;  Location: [facility];  Service: Cardiovascular;  Laterality: N/A;  . AMPUTATION Left 09/05/2017   Procedure: AMPUTATION BELOW KNEE;  Surgeon: Tania Ade, MD;  Location: [facility];  Service: Orthopedics;  Laterality: Left;  . ANTERIOR CERVICAL DECOMP/DISCECTOMY FUSION  N/A 05/07/2017   Procedure: ANTERIOR CERVICAL DECOMPRESSION/DISCECTOMY Luevenia Maxin PROSTHESIS,PLATE/SCREWS CERVICAL FIVE - CERVICAL SIX;  Surgeon: Newman Pies, MD;  Location: [facility];  Service: Neurosurgery;  Laterality: N/A;  . CARDIAC CATHETERIZATION    . CATARACT EXTRACTION W/ INTRAOCULAR LENS IMPLANT    . CATARACT EXTRACTION W/PHACO Left 02/26/2017   Procedure: CATARACT EXTRACTION PHACO AND INTRAOCULAR LENS PLACEMENT (IOC);  Surgeon: Eulogio Bear, MD;  Location: [facility];  Service: Ophthalmology;  Laterality: Left;  Lot # [id number] H [place]: 00:25.3 AP%: 6.2 CDE: 1.59   . CHOLECYSTECTOMY N/A   . CORONARY ANGIOPLASTY WITH STENT PLACEMENT  2007   1st diagonal  . EPIBLEPHERON REPAIR WITH TEAR DUCT PROBING    . EYE SURGERY Right sept 29n2015   cataract extraction  . INSERTION EXPRESS TUBE SHUNT Right 09/06/2015   Procedure: INSERTION AHMED TUBE SHUNT with tutoplast allograft;  Surgeon: Eulogio Bear, MD;  Location: [facility];  Service: Ophthalmology;  Laterality: Right;  Marland Kitchen VASECTOMY      Allergies Patient has no known allergies.  Family History  Problem Relation Age of Onset  . Hyperlipidemia Mother   . Diabetes Mother   . Liver disease Mother        NASH  . Hyperlipidemia Father   . Diabetes Father   . Heart disease Father   . Hypertension Father     Social History Social History   Tobacco Use  . Smoking status: Former Smoker    Types: Cigarettes    Quit date: 03/14/2008    Years since quitting: 10.8  . Smokeless tobacco: Never Used  Substance Use Topics  . Alcohol use: Yes    Comment: OCCAS  . Drug use: No    Review of Systems  Constitutional: No fever/chills Eyes: No visual changes. ENT: No sore throat. Cardiovascular: Denies chest pain. Positive left arm pain.  Respiratory: Positive shortness of breath. Gastrointestinal: No abdominal pain.  No nausea, no vomiting.  No diarrhea.  No constipation. Genitourinary: Negative for dysuria.  Musculoskeletal: Negative for back pain. Positive bilateral LE swelling.  Skin: Negative for rash. Neurological: Negative for headaches, focal weakness or numbness.  10-point ROS otherwise negative.  ____________________________________________   PHYSICAL EXAM:  VITAL SIGNS: ED Triage Vitals  Enc Vitals Group     BP 01/19/19 0818 (!) 161/77     Pulse Rate 01/19/19 0818 64     Resp 01/19/19 0818 16     Temp 01/19/19 0818 98.6 F (37 C)     Temp Source 01/19/19 0812 Oral     SpO2 01/19/19 0818 99 %   Constitutional: Alert and oriented. Well appearing and in no acute distress. Eyes: Conjunctivae are normal.  Head: Atraumatic. Nose: No congestion/rhinnorhea. Mouth/Throat: Mucous membranes are moist.   Neck: No stridor.   Cardiovascular: Normal rate, regular rhythm. Good peripheral circulation. Grossly normal heart sounds.   Respiratory: Normal respiratory effort.  No retractions. Lungs CTAB. Gastrointestinal: Soft and nontender. No distention.  Musculoskeletal: Left BKA with pitting edema and RLE pitting edema 2+.  Neurologic:  Normal speech and language. Skin:  Skin is  warm, dry and intact. No rash noted.  ____________________________________________   LABS (all labs ordered are listed, but only abnormal results are displayed)  Labs Reviewed  BASIC METABOLIC PANEL - Abnormal; Notable for the following components:      Result Value   Glucose, Bld 102 (*)    BUN 28 (*)    Creatinine, Ser 2.84 (*)    GFR calc non Af Amer 24 (*)    GFR calc Af Amer 28 (*)    All other components within normal limits  CBC - Abnormal; Notable for the following components:   RBC 3.95 (*)    Hemoglobin 11.6 (*)    HCT 36.8 (*)    All other components within normal limits  LIPID PANEL - Abnormal; Notable for the following components:   Triglycerides 151 (*)    All other components within normal limits  COMPREHENSIVE METABOLIC PANEL - Abnormal; Notable for the following components:   BUN  28 (*)    Creatinine, Ser 2.89 (*)    Calcium 8.5 (*)    Total Protein 6.0 (*)    Albumin 2.8 (*)    GFR calc non Af Amer 23 (*)    GFR calc Af Amer 27 (*)    All other components within normal limits  HEMOGLOBIN A1C - Abnormal; Notable for the following components:   Hgb A1c MFr Bld 9.3 (*)    All other components within normal limits  CBG MONITORING, ED - Abnormal; Notable for the following components:   Glucose-Capillary 100 (*)    All other components within normal limits  SARS CORONAVIRUS 2 (HOSPITAL ORDER, [facility])  PROTIME-INR  APTT  URINALYSIS, ROUTINE W REFLEX MICROSCOPIC  HEPARIN LEVEL (UNFRACTIONATED)  CBG MONITORING, ED  CBG MONITORING, ED  TROPONIN I (HIGH SENSITIVITY)  TROPONIN I (HIGH SENSITIVITY)   ____________________________________________  EKG   EKG Interpretation  Date/Time:  Wednesday January 19 2019 08:32:10 EDT Ventricular Rate:  65 PR Interval:  204 QRS Duration: 86 QT Interval:  410 QTC Calculation: 426 R Axis:   73 Text Interpretation:  STEMI Normal sinus rhythm ST elevation consider lateral injury or acute infarct ** ** ACUTE MI / STEMI ** ** Abnormal ECG Confirmed by Nanda Quinton [phone]) on 01/19/2019 8:36:46 AM       ____________________________________________  RADIOLOGY  Dg Chest 2 View  Result Date: 01/19/2019 CLINICAL DATA:  Increased swelling and shortness of breath for the past few days, renal insufficiency, chest pressure with pain into LEFT arm, worsened swelling in abdomen, exertional shortness of breath, hypertension, diabetes mellitus cart heart disease/coronary artery disease, asthma EXAM: CHEST - 2 VIEW COMPARISON:  09/08/2017 FINDINGS: Upper normal heart size. Mediastinal contours and pulmonary vascularity normal. Lungs clear. No infiltrate, pleural effusion or pneumothorax. Prior cervical spine fusion. IMPRESSION: No acute abnormalities. Electronically Signed   By: Lavonia Dana M.D.   On:  01/19/2019 10:49   US Renal  Result Date: 01/19/2019 CLINICAL DATA:  Acute kidney injury. EXAM: RENAL / URINARY TRACT ULTRASOUND COMPLETE COMPARISON:  Renal ultrasound 04/29/2017. FINDINGS: Right Kidney: Renal measurements: 12.1 x 5.2 x 5.5 cm = volume: 182.2 mL . Echogenicity is mildly increased. No mass or hydronephrosis visualized. Left Kidney: Renal measurements: 12.7 x 5.8 x 5.4 cm = volume: 208.4 mL. Echogenicity is mildly increased. No mass or hydronephrosis visualized. Bladder: Appears normal for degree of bladder distention. IMPRESSION: Negative for hydronephrosis or other acute abnormality. Mildly increased cortical echogenicity is compatible with medical renal disease  and unchanged compared to the prior exam. Electronically Signed   By: Inge Rise M.D.   On: 01/19/2019 12:05    ____________________________________________   PROCEDURES  Procedure(s) performed:   Procedures  CRITICAL CARE Performed by: Margette Fast Total critical care time: 35 minutes Critical care time was exclusive of separately billable procedures and treating other patients. Critical care was necessary to treat or prevent imminent or life-threatening deterioration. Critical care was time spent personally by me on the following activities: development of treatment plan with patient and/or surrogate as well as nursing, discussions with consultants, evaluation of patient's response to treatment, examination of patient, obtaining history from patient or surrogate, ordering and performing treatments and interventions, ordering and review of laboratory studies, ordering and review of radiographic studies, pulse oximetry and re-evaluation of patient's condition.  Nanda Quinton, MD Emergency Medicine  ____________________________________________   INITIAL IMPRESSION / ASSESSMENT AND PLAN / ED COURSE  Pertinent labs & imaging results that were available during my care of the patient were reviewed by me and  considered in my medical decision making (see chart for details).   Patient presents to the emergency department for evaluation of shortness of breath, lower extremity swelling, with new onset left posterior shoulder and arm pain.  EKG significantly changed from prior and appears ischemic.  Code STEMI activated.  Once the patient was moved to the acute care area his left arm discomfort had resolved.  Dr. Martinique evaluated the patient at bedside and will cancel code STEMI without active pain.  They do plan for admission and nephrology consult which they have ordered.   Labs reviewed which show CKD similar to the patient's baseline.  Initial troponin is 10.  Repeat EKG improved from triage EKG. CXR reviewed.   Discussed patient's case with Cardiology, Dr. Martinique to request admission. Patient and family (if present) updated with plan. Care transferred to Cardiology service.  I reviewed all nursing notes, vitals, pertinent old records, EKGs, labs, imaging (as available).  ____________________________________________  FINAL CLINICAL IMPRESSION(S) / ED DIAGNOSES  Final diagnoses:  Precordial chest pain  SOB (shortness of breath)     MEDICATIONS GIVEN DURING THIS VISIT:  Medications  sodium chloride flush (NS) 0.9 % injection 3 mL (3 mLs Intravenous Not Given 01/19/19 0909)  aspirin EC tablet 81 mg (81 mg Oral Not Given 01/19/19 0957)  amLODipine (NORVASC) tablet 10 mg (10 mg Oral Not Given 01/19/19 1007)  metoprolol tartrate (LOPRESSOR) tablet 50 mg (50 mg Oral Not Given 01/19/19 1007)  rosuvastatin (CRESTOR) tablet 40 mg (has no administration in time range)  insulin aspart (novoLOG) injection 0-15 Units (0 Units Subcutaneous Not Given 01/19/19 1252)  heparin ADULT infusion 100 units/mL (25000 units/238mL sodium chloride 0.45%) (1,350 Units/hr Intravenous New Bag/Given 01/19/19 1042)  sodium chloride flush (NS) 0.9 % injection 3 mL (3 mLs Intravenous Not Given 01/19/19 1335)  sodium chloride flush  (NS) 0.9 % injection 3 mL (has no administration in time range)  0.9 %  sodium chloride infusion (has no administration in time range)  0.9 %  sodium chloride infusion ( Intravenous New Bag/Given 01/19/19 1045)  aspirin chewable tablet 324 mg (324 mg Oral Given 01/19/19 0851)  heparin injection 4,000 Units (4,000 Units Intravenous Given 01/19/19 0853)  furosemide (LASIX) injection 80 mg (80 mg Intravenous Given 01/19/19 1053)    Note:  This document was prepared using Dragon voice recognition software and may include unintentional dictation errors.  Nanda Quinton, MD, [facility] Emergency Medicine    ,  [facility], MD 01/19/19 1438

## 2019-01-19 NOTE — ED Notes (Signed)
ACTIVATED CODE STEMI [place]

## 2019-01-19 NOTE — ED Notes (Signed)
Dr. Martinique cancelled code stemi

## 2019-01-19 NOTE — H&P (Addendum)
Cardiology Admission History and Physical:   Patient ID: DSEAN VANTOL; MRN: [id number]; DOB: 06-Jul-1965   Admission date: 01/19/2019  Primary Care Provider: [name], MD Primary Cardiologist: Dr. Bartholome Bill Primary Electrophysiologist:  N/A  Chief Complaint: Chest pain, shortness of breath  Patient Profile:   Chase Clark is a 53 y.o. male with a history of hypertension, uncontrolled diabetes mellitus, hyperlipidemia, mild PAD, CKD stage III with proteinuria, CAD with silent ischemia s/p LHC with DES to [place] in 2011 at [facility] here with chest pain concerning for myocardial infarction as initial EKG reviewed T wave inversion and minimal ST elevation at the inferior lateral leads however now chest pain-free with repeat EKG unremarkable.  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  Chase Clark reports he was in his usual state of health until morning when he began experiencing left shoulder pain which he described as a "toothache "which had radiated down his left arm.  Prior to his left shoulder pain, he had been experiencing exertional dyspnea and increasing lower extremity edema for several days.  His left shoulder pain was not constant and denied diaphoresis, nausea, lightheadedness, dizziness.  On arrival to the ED, a code STEMI was activated as initial EKG showed T wave inversion and minimal ST elevation at the inferior lateral leads.  The code STEMI team went in to evaluate the patient and at that point, he was chest pain-free.   Repeat EKG was unremarkable and he had received aspirin and IV heparin.  In regards to his cardiac history, he follows up with Dr. Ubaldo Glassing of [place].  His last visit was December 2018.  Records show that he initially underwent left heart cath with DES to DI AG in 2011 at [facility].  He subsequently had a repeat cardiac catheterization 2015 which showed 40% stenosis of proximal LAD with no flow impairment and 10% stenosis of the first diagonal stent.  He has had 2 myocardial perfusion scans.  The first was in 2017 which was unremarkable with an LVEF of 58%.  A subsequent Myoview was done in 2018 which was also unremarkable with an LVEF of 52%.  For his CKD stage III, he follows up with [facility] (Dr. Royce Macadamia) and states he had a planned renal biopsy scheduled for next week.  ED course: Initially hypertensive to 210s/100s however later on improved to 120s-130s/70s-80s, BMP showed BUN 28, creatinine 2.8, high sensitive troponin at 10.  He received full dose aspirin and heparin.    Past Medical History:  Diagnosis Date   Arthritis    Asthma    as child   Coronary artery disease    DES DIAG1 11/20/09 [facility])   Diabetes mellitus without complication ([place])    Diabetic osteomyelitis ([place])    Diabetic ulcer of foot with muscle involvement without evidence of necrosis ([place])    DIABETIC ULCERATIONS ASSOCIATED WITH IRRITATION LATERAL ANKLE LEFT GREATER THAN RIGHT WITH MILD CELLULITIS    Diverticulosis    DKA (diabetic ketoacidoses) ([place]) 08/23/2017   Edema, lower extremity    Fatty liver    GERD (gastroesophageal  reflux disease)    Gilbert's disease    Glaucoma    Heart disease 2011   Patient has stent for 80% blockage   Hyperlipidemia    Hypertension    Neuropathy    Seasonal allergies    Shoulder pain    Ulcer of foot ([place]) 08.06.2014   DIABETIC ULCERATIONS ASSOCIATED WITH IRRITATION LATERAL ANKLE LEFT GREATER THAN RIGHT WITH MILD CELLULITIS     Past Surgical History:  Procedure Laterality Date   ABDOMINAL AORTOGRAM N/A 05/20/2016   Procedure: Abdominal Aortogram possible intervention;  Surgeon: Katha Cabal, MD;  Location: [place] [facility];  Service: Cardiovascular;  Laterality: N/A;   AMPUTATION Left 09/05/2017   Procedure: AMPUTATION BELOW KNEE;  Surgeon: Tania Ade, MD;  Location: [place];  Service: Orthopedics;  Laterality: Left;   ANTERIOR CERVICAL DECOMP/DISCECTOMY FUSION N/A 05/07/2017   Procedure: ANTERIOR CERVICAL DECOMPRESSION/DISCECTOMY [place]ia Maxin PROSTHESIS,PLATE/SCREWS CERVICAL FIVE - CERVICAL SIX;  Surgeon: Newman Pies, MD;  Location: [place];  Service: Neurosurgery;  Laterality: N/A;   CARDIAC CATHETERIZATION     CATARACT EXTRACTION W/ INTRAOCULAR LENS IMPLANT     CATARACT EXTRACTION W/PHACO Left 02/26/2017   Procedure: CATARACT EXTRACTION PHACO AND INTRAOCULAR LENS PLACEMENT ([place]);  Surgeon: Eulogio Bear, MD;  Location: [facility];  Service: Ophthalmology;  Laterality: Left;  Lot # [id number] H [place]: 00:25.3 AP%: 6.2 CDE: 1.59    CHOLECYSTECTOMY N/A    CORONARY ANGIOPLASTY WITH STENT PLACEMENT  2007   1st diagonal   EPIBLEPHERON REPAIR WITH TEAR DUCT PROBING     EYE SURGERY Right sept [id number]   cataract extraction   INSERTION EXPRESS TUBE SHUNT Right 09/06/2015   Procedure: INSERTION AHMED TUBE SHUNT with tutoplast allograft;  Surgeon: Eulogio Bear, MD;  Location: [facility];  Service: Ophthalmology;  Laterality: Right;   VASECTOMY       Medications Prior to Admission: Prior to Admission medications   Medication Sig Start Date End Date Taking? Authorizing Provider  acetaminophen (TYLENOL) 325 MG tablet Take 1 tablet (325 mg total) by mouth every 6 (six) hours as needed for mild pain (pain score 1-3 or temp > 100.5). 09/06/17   Grier Mitts, PA-C  amLODipine (NORVASC) 10 MG tablet Take 1 tablet (10 mg total) by mouth daily. 07/19/18   [name], MD  aspirin EC  81 MG tablet Take 81 mg by mouth daily.    [provider]  brimonidine-timolol (COMBIGAN) 0.2-0.5 % ophthalmic solution Place 1 drop into the right eye every 12 (twelve) hours.     [provider]  Continuous Blood Gluc Receiver ([place]) [place] 1 each by Does not apply route as directed. Use to check blood glucose 5 times daily 08/26/17   [name], MD  Continuous Blood Gluc Sensor (DEXCOM G6 SENSOR) MISC 1 each by Does not apply route as directed. Place 1 sensor subcutaneously once every 10 days. 08/26/17   [name], MD  Continuous Blood Gluc Transmit (DEXCOM G6 TRANSMITTER) MISC 1 each by Does not apply route as directed. Use to check blood glucose 5 times daily. Refill every 3 months. 08/26/17   [name], MD  DUREZOL 0.05 % EMUL Place 1 drop into the left eye at bedtime.  03/27/17   [provider]  esomeprazole (NEXIUM) 20 MG capsule Take 20 mg by mouth daily at 12 noon.    [provider]  gabapentin (NEURONTIN) 300 MG capsule Take 2 capsules (600 mg total) by mouth 2 (two)  times daily. Patient taking differently: Take 300-600 mg by mouth See admin instructions. Take 300mg  in the AM and 600mg  in the PM 07/19/18   [name], MD  insulin aspart (NOVOLOG) 100 UNIT/ML injection Inject up to 15 units up to 3 times daily with meals 01/10/19   [name], MD  Insulin Glargine (LANTUS) 100 UNIT/ML Solostar Pen Inject 60 Units into the skin 2 (two) times daily. 01/10/19   [name], MD  Insulin Pen Needle 32G X 4 MM MISC Inject 5 pens into the skin daily. Use to inject Lantus and Novolog up to 5 times daily 01/10/19   [name], MD  ketoconazole (NIZORAL) 2 % cream Apply 1 application topically 2 (two) times daily. 12/13/18   [provider]  latanoprost (XALATAN) 0.005 % ophthalmic solution Place 1 drop into both eyes at bedtime. 03/03/17   [provider]  meclizine (ANTIVERT) 12.5 MG  tablet Take 1 tablet (12.5 mg total) by mouth 3 (three) times daily as needed for dizziness. 12/31/18   Valarie Merino, MD  metoprolol tartrate (LOPRESSOR) 100 MG tablet TAKE 0.5 TABLETS (50 MG TOTAL) BY MOUTH 2 (TWO) TIMES DAILY. 01/13/19   [name], MD  rosuvastatin (CRESTOR) 10 MG tablet Take 1 tablet (10 mg total) by mouth daily. 07/19/18   [name], MD     Allergies:   No Known Allergies  Social History:   Social History   Socioeconomic History   Marital status: Married    Spouse name: Not on file   Number of children: Not on file   Years of education: Not on file   Highest education level: Not on file  Occupational History   Not on file  Social Needs   Financial resource strain: Not on file   Food insecurity    Worry: Not on file    Inability: Not on file   Transportation needs    Medical: Not on file    Non-medical: Not on file  Tobacco Use   Smoking status: Former Smoker    Types: Cigarettes    Quit date: 03/14/2008    Years since quitting: 10.8   Smokeless tobacco: Never Used  Substance and Sexual Activity   Alcohol use: Yes    Comment: OCCAS   Drug use: No   Sexual activity: Not on file  Lifestyle   Physical activity    Days per week: Not on file    Minutes per session: Not on file   Stress: Not on file  Relationships   Social connections    Talks on phone: Not on file    Gets together: Not on file    Attends religious service: Not on file    Active member of club or organization: Not on file    Attends meetings of clubs or organizations: Not on file    Relationship status: Not on file   Intimate partner violence    Fear of current or ex partner: Not on file    Emotionally abused: Not on file    Physically abused: Not on file    Forced sexual activity: Not on file  Other Topics Concern   Not on file  Social History Narrative   Used to work as paramedic    Married     Family History:   The patient's family  history includes Diabetes in his father and mother; Heart disease in his father; Hyperlipidemia in his father and mother; Hypertension in  his father; Liver disease in his mother.    ROS:  Please see the history of present illness.  All other ROS reviewed and negative.     Physical Exam/Data:   Vitals:   01/19/19 0913 01/19/19 0915 01/19/19 0930 01/19/19 0945  BP:   (!) 146/79 (!) 148/76  Pulse:  68 65 65  Resp:  11 15 11   Temp:      TempSrc:      SpO2:  97% 96% 97%  Weight: 112.5 kg     Height: 5\' 10"  (1.778 m)      No intake or output data in the 24 hours ending 01/19/19 1006 Filed Weights   01/19/19 0909 01/19/19 0913  Weight: 112.5 kg 112.5 kg   Body mass index is 35.58 kg/m.  General:  Well nourished, well developed, in no acute distress HEENT: normal Vascular: Brachial pulses 2+ bilaterally  Cardiac:  normal S1, S2; RRR; no murmur  Lungs:  clear to auscultation bilaterally, no wheezing, rhonchi or rales  Abd: soft, nontender, no hepatomegaly  Ext: Trace edema on the right lower extremity, left BKA Musculoskeletal: Left BKA, BUE and BLE strength normal and equal Skin: warm and dry  Neuro:  CNs 2-12 intact, no focal abnormalities noted Psych:  Normal affect    EKG: Initial EKG with T wave inversion and minimal ST elevation at the inferior lateral leads.  Repeat EKG was unremarkable  Relevant CV Studies: Echocardiogram pending  Laboratory Data:  Chemistry Recent Labs  Lab 01/19/19 0833  NA 139  K 4.5  CL 106  CO2 23  GLUCOSE 102*  BUN 28*  CREATININE 2.84*  CALCIUM 9.0  GFRNONAA 24*  GFRAA 28*  ANIONGAP 10    No results for input(s): PROT, ALBUMIN, AST, ALT, ALKPHOS, BILITOT in the last 168 hours. Hematology Recent Labs  Lab 01/19/19 0833  WBC 8.4  RBC 3.95*  HGB 11.6*  HCT 36.8*  MCV 93.2  MCH 29.4  MCHC 31.5  RDW 13.8  PLT 197   Cardiac EnzymesNo results for input(s): TROPONINI in the last 168 hours. No results for input(s): TROPIPOC in  the last 168 hours.  BNPNo results for input(s): BNP, PROBNP in the last 168 hours.  DDimer No results for input(s): DDIMER in the last 168 hours.  Radiology/Studies:  No results found.  Assessment and Plan:   1. Precordial chest pain: 53 year old gentleman with prior history of HTN, uncontrolled diabetes mellitus, hyperlipidemia, mild PAD, CAD status post LHC with DES to DI AG 2011 with repeat LHC in 2015 which showed 40% stenosis of proximal LAD with no flow impairment and 10% stenosis of the first diagonal stent here with atypical chest pain, left shoulder pain with radiation caudally to the left arm.  On arrival, initial EKG concerning for acute MI as it showed T wave inversion and minimal ST elevation in the inferior lateral leads.  Patient was however chest pain-free and repeat EKG was unremarkable.  Patient high-sensitivity troponin unremarkable at 10.       - Status post full dose aspirin and heparin       - Continue heparin       - Follow-up troponin every 2 hours, EKG       -Tentative LHC planned for tomorrow 01/20/2019 or sooner if chest pain recurs       -Continue cardiac monitoring        -Supplemental oxygen as needed        -Follow-up echocardiogram  2.  CKD  stage III with proteinuria: Follows up with [facility] and currently being worked up for proteinuria.  His baseline serum creatinine is ~2.4.  We were able to speak to Dr. Royce Macadamia who on further discussion raised concerned about patient not being able to tolerate fluids as we try to minimize renal injury with contrast.  Moving forward, we would use KVO and plan tentative LHC for 01/20/2019       -Appreciate nephrology recommendation  3.  Hypertension: Initially hypertensive to 210s/100s however this spontaneously           improved.      -Continue metoprolol, amlodipine  4.  Hyperlipidemia: LDL of 72.       -Continue Crestor 40 mg daily  5.  Uncontrolled diabetes mellitus: His last A1c was 11.2% on 12/17/2018.       -Continue sliding scale insulin     -Continue CBG monitoring   Severity of Illness: The appropriate patient status for this patient is INPATIENT. Inpatient status is judged to be reasonable and necessary in order to provide the required intensity of service to ensure the patient's safety. The patient's presenting symptoms, physical exam findings, and initial radiographic and laboratory data in the context of their chronic comorbidities is felt to place them at high risk for further clinical deterioration. Furthermore, it is not anticipated that the patient will be medically stable for discharge from the hospital within 2 midnights of admission. The following factors support the patient status of inpatient.   " The patient's presenting symptoms include chest pain, shortness of breath. " The worrisome physical exam findings right lower extremity edema " The chronic co-morbidities include hypertension, diabetes mellitus, hyperlipidemia, mild PAD.   * I certify that at the point of admission it is my clinical judgment that the patient will require inpatient hospital care spanning beyond 2 midnights from the point of admission due to high intensity of service, high risk for further deterioration and high frequency of surveillance required.*    For questions or updates, please contact [place] Please consult [email or web address] for contact info under Cardiology/STEMI.    Signed, Chase Rosenthal, MD  01/19/2019 10:06 AM   Patient seen and examined and history reviewed. Agree with above findings and plan.  Pleasant 53 yo WM with history of poorly controlled DM, HLD, prior left BKA for osteomyelitis, recent worsening renal failure with nephrotic syndrome and history of CAD s/p stenting of either LAD or diagonal in 2011. He reports worsening swelling and dyspnea over the past 3 days unrelieved with increase lasix dose. Today he developed left shoulder pain like a toothache radiating to his left arm. On  arrival Ecg showed mild ST elevation in 1 and AVL with ST depression and T wave inversion inferiorly. In the ED his pain resolved and Ecg normalized. Prior to today he had no angina. Last cardiac evaluation with cath in 2015 showed no obstructive disease. Myoview in December 2018 was normal  On exam he is in NAD.  BP and HR normal +JVD Lungs with diminished BS in bases CV RRR no gallop, murmur rub Abd. Soft NT 2-3+ LE edema. Left BKA Neuro intact.  Ecg as noted above. Creatinine 2.84. glucose 102. Potassium 4.5. bicarb 23. Hgb 11.6  Troponin normal x 2  Impression: 1. USAP. Possible aborted lateral MI. Currently pain free and Ecg is normal. Plan to admit. IV heparin. If recurrent chest pain will start IV Ntg. Will check Echo. Plan cardiac cath tomorrow with limited dye load. Continue  metoprolol, ASA, amlodipine. High dose statin 2. Progressive renal failure. Currently being evaluated by Dr Royce Macadamia. Nephrotic syndrome with progressive volume overload. Was scheduled for renal biopsy next week. Will consult Renal. Discussed over the phone. Not a candidate for hydration prior to cath due to volume overload. 3. Hypercholesterolemia. Need to intensify statin therapy 4. Uncontrolled DM. Last A1c 11.2%. SSI 5. S/p left BKA due to osteomyelitis. Prior LE arterial dopplers look good  6. HTN       Porsha Skilton [place], [place] 01/19/2019 1:12 PM

## 2019-01-19 NOTE — Progress Notes (Signed)
ANTICOAGULATION CONSULT NOTE - Initial Consult  Pharmacy Consult for Heparin  Indication: chest pain/ACS  No Known Allergies  Patient Measurements: Height: 5\' 10"  (177.8 cm) Weight: 248 lb (112.5 kg) IBW/kg (Calculated) : 73 Heparin Dosing Weight: 97.6 kg  Vital Signs: Temp: 98.6 F (37 C) (10/07 0818) Temp Source: Oral (10/07 0818) BP: 165/87 (10/07 0902) Pulse Rate: 68 (10/07 0915)  Labs: No results for input(s): HGB, HCT, PLT, APTT, LABPROT, INR, HEPARINUNFRC, HEPRLOWMOCWT, CREATININE, CKTOTAL, CKMB, TROPONINIHS in the last 72 hours.  Estimated Creatinine Clearance: 43.3 mL/min (A) (by C-G formula based on SCr of 2.42 mg/dL (H)).   Medical History: Past Medical History:  Diagnosis Date  . Arthritis   . Asthma    as child  . Coronary artery disease    DES DIAG1 11/20/09 [facility])  . Diabetes mellitus without complication ([place])   . Diabetic osteomyelitis ([place])   . Diabetic ulcer of foot with muscle involvement without evidence of necrosis ([place])    DIABETIC ULCERATIONS ASSOCIATED WITH IRRITATION LATERAL ANKLE LEFT GREATER THAN RIGHT WITH MILD CELLULITIS   . Diverticulosis   . DKA (diabetic ketoacidoses) ([place]) 08/23/2017  . Edema, lower extremity   . Fatty liver   . GERD (gastroesophageal reflux disease)   . Gilbert's disease   . Glaucoma   . Heart disease 2011   Patient has stent for 80% blockage  . Hyperlipidemia   . Hypertension   . Neuropathy   . Seasonal allergies   . Shoulder pain   . Ulcer of foot ([place]) 08.06.2014   DIABETIC ULCERATIONS ASSOCIATED WITH IRRITATION LATERAL ANKLE LEFT GREATER THAN RIGHT WITH MILD CELLULITIS    Medications:    Assessment: 1 YOM with known CAD (stent 2011), CKD, DM, HTN, HLD presented to [facility] ED with swelling, SOB, and chest pressure with pain to Left arm. No immediate plans for cath, will medically manage with IV heparin for now. Not on anticoagulants prior to admission. - Heparin 4,000 unit bolus given in ED. - CBC ok, a  little anemic with Hgb 11.6  Goal of Therapy:  Heparin level 0.3-0.7 units/ml Monitor platelets by anticoagulation protocol: Yes   Plan:  Start heparin infusion at 1350 units/hr  Check heparin level in 6 hours Daily CBC  Vallery Sa, PharmD Candidate 01/19/2019,9:19 AM

## 2019-01-19 NOTE — ED Notes (Signed)
Pt no longer complaining of chest pain

## 2019-01-19 NOTE — ED Notes (Signed)
Canceled code stemi  

## 2019-01-19 NOTE — ED Notes (Addendum)
CBG 100 

## 2019-01-19 NOTE — ED Notes (Signed)
Dinner tray ordered.

## 2019-01-20 ENCOUNTER — Other Ambulatory Visit: Payer: Self-pay

## 2019-01-20 ENCOUNTER — Encounter (HOSPITAL_COMMUNITY)
Admission: EM | Disposition: A | Discharge status: Home / Self Care | Payer: Self-pay | Source: Home / Self Care | Attending: Cardiology

## 2019-01-20 ENCOUNTER — Encounter (HOSPITAL_COMMUNITY): Payer: Self-pay | Admitting: *Deleted

## 2019-01-20 DIAGNOSIS — N179 Acute kidney failure, unspecified: Secondary | ICD-10-CM

## 2019-01-20 DIAGNOSIS — N049 Nephrotic syndrome with unspecified morphologic changes: Secondary | ICD-10-CM

## 2019-01-20 DIAGNOSIS — N184 Chronic kidney disease, stage 4 (severe): Secondary | ICD-10-CM

## 2019-01-20 DIAGNOSIS — I25119 Atherosclerotic heart disease of native coronary artery with unspecified angina pectoris: Secondary | ICD-10-CM

## 2019-01-20 HISTORY — PX: LEFT HEART CATH AND CORONARY ANGIOGRAPHY: CATH118249

## 2019-01-20 LAB — CBC
HCT: 31.2 % — ABNORMAL LOW (ref 39.0–52.0)
Hemoglobin: 10.5 g/dL — ABNORMAL LOW (ref 13.0–17.0)
MCH: 30.3 pg (ref 26.0–34.0)
MCHC: 33.7 g/dL (ref 30.0–36.0)
MCV: 89.9 fL (ref 80.0–100.0)
Platelets: 161 10*3/uL (ref 150–400)
RBC: 3.47 MIL/uL — ABNORMAL LOW (ref 4.22–5.81)
RDW: 13.7 % (ref 11.5–15.5)
WBC: 6.9 10*3/uL (ref 4.0–10.5)
nRBC: 0 % (ref 0.0–0.2)

## 2019-01-20 LAB — COMPREHENSIVE METABOLIC PANEL
ALT: 14 U/L (ref 0–44)
AST: 18 U/L (ref 15–41)
Albumin: 2.5 g/dL — ABNORMAL LOW (ref 3.5–5.0)
Alkaline Phosphatase: 60 U/L (ref 38–126)
Anion gap: 8 (ref 5–15)
BUN: 30 mg/dL — ABNORMAL HIGH (ref 6–20)
CO2: 25 mmol/L (ref 22–32)
Calcium: 8.3 mg/dL — ABNORMAL LOW (ref 8.9–10.3)
Chloride: 104 mmol/L (ref 98–111)
Creatinine, Ser: 3.09 mg/dL — ABNORMAL HIGH (ref 0.61–1.24)
GFR calc Af Amer: 25 mL/min — ABNORMAL LOW (ref >60)
GFR calc non Af Amer: 22 mL/min — ABNORMAL LOW (ref >60)
Glucose, Bld: 198 mg/dL — ABNORMAL HIGH (ref 70–99)
Potassium: 4 mmol/L (ref 3.5–5.1)
Sodium: 137 mmol/L (ref 135–145)
Total Bilirubin: 1 mg/dL (ref 0.3–1.2)
Total Protein: 5.4 g/dL — ABNORMAL LOW (ref 6.5–8.1)

## 2019-01-20 LAB — GLUCOSE, CAPILLARY
Glucose-Capillary: 166 mg/dL — ABNORMAL HIGH (ref 70–99)
Glucose-Capillary: 141 mg/dL — ABNORMAL HIGH (ref 70–99)
Glucose-Capillary: 188 mg/dL — ABNORMAL HIGH (ref 70–99)
Glucose-Capillary: 181 mg/dL — ABNORMAL HIGH (ref 70–99)

## 2019-01-20 LAB — HEPARIN LEVEL (UNFRACTIONATED): Heparin Unfractionated: 0.33 IU/mL (ref 0.30–0.70)

## 2019-01-20 LAB — POCT ACTIVATED CLOTTING TIME: Activated Clotting Time: 136 seconds

## 2019-01-20 SURGERY — LEFT HEART CATH AND CORONARY ANGIOGRAPHY
Anesthesia: LOCAL

## 2019-01-20 MED ORDER — DEXCOM G6 SENSOR MISC: 1.0000 | Status: DC

## 2019-01-20 MED ORDER — SODIUM CHLORIDE 0.9 % IV SOLN: 250.0000 mL | INTRAVENOUS | Status: DC | PRN

## 2019-01-20 MED ORDER — HEPARIN (PORCINE) IN NACL 1000-0.9 UT/500ML-% IV SOLN
INTRAVENOUS | Status: AC
  Filled 2019-01-20: qty 1000

## 2019-01-20 MED ORDER — ACETYLCYSTEINE 20 % IN SOLN
1200.0000 mg | Freq: Two times a day (BID) | RESPIRATORY_TRACT | Status: AC
  Administered 2019-01-20 – 2019-01-21 (×2): 1200 mg via ORAL
  Filled 2019-01-20 (×2): qty 4
  Filled 2019-01-20: qty 8
  Filled 2019-01-20: qty 4

## 2019-01-20 MED ORDER — HYDRALAZINE HCL 20 MG/ML IJ SOLN: 10.0000 mg | INTRAMUSCULAR | Status: AC | PRN

## 2019-01-20 MED ORDER — FENTANYL CITRATE (PF) 100 MCG/2ML IJ SOLN
INTRAMUSCULAR | Status: DC | PRN
  Administered 2019-01-20: 25 ug via INTRAVENOUS

## 2019-01-20 MED ORDER — LIDOCAINE HCL (PF) 1 % IJ SOLN
INTRAMUSCULAR | Status: AC
  Filled 2019-01-20: qty 30

## 2019-01-20 MED ORDER — SODIUM CHLORIDE 0.9% FLUSH
3.0000 mL | Freq: Two times a day (BID) | INTRAVENOUS | Status: DC
  Administered 2019-01-20 – 2019-01-22 (×3): 3 mL via INTRAVENOUS

## 2019-01-20 MED ORDER — SODIUM CHLORIDE 0.9% FLUSH
3.0000 mL | Freq: Two times a day (BID) | INTRAVENOUS | Status: DC
  Administered 2019-01-20: 3 mL via INTRAVENOUS

## 2019-01-20 MED ORDER — LABETALOL HCL 5 MG/ML IV SOLN: 10.0000 mg | INTRAVENOUS | Status: AC | PRN

## 2019-01-20 MED ORDER — FUROSEMIDE 40 MG PO TABS: 40.0000 mg | ORAL_TABLET | Freq: Every day | ORAL | Status: DC

## 2019-01-20 MED ORDER — FENTANYL CITRATE (PF) 100 MCG/2ML IJ SOLN
INTRAMUSCULAR | Status: AC
  Filled 2019-01-20: qty 2

## 2019-01-20 MED ORDER — HYDRALAZINE HCL 20 MG/ML IJ SOLN
INTRAMUSCULAR | Status: DC | PRN
  Administered 2019-01-20: 10 mg via INTRAVENOUS

## 2019-01-20 MED ORDER — MIDAZOLAM HCL 2 MG/2ML IJ SOLN
INTRAMUSCULAR | Status: AC
  Filled 2019-01-20: qty 2

## 2019-01-20 MED ORDER — ONDANSETRON HCL 4 MG/2ML IJ SOLN
4.0000 mg | Freq: Four times a day (QID) | INTRAMUSCULAR | Status: DC | PRN
  Filled 2019-01-20: qty 2

## 2019-01-20 MED ORDER — IOHEXOL 350 MG/ML SOLN
INTRAVENOUS | Status: DC | PRN
  Administered 2019-01-20: 50 mL

## 2019-01-20 MED ORDER — HEPARIN (PORCINE) 25000 UT/250ML-% IV SOLN
1700.0000 [IU]/h | INTRAVENOUS | Status: DC
  Administered 2019-01-20: 1550 [IU]/h via INTRAVENOUS
  Filled 2019-01-20 (×2): qty 250

## 2019-01-20 MED ORDER — DEXCOM G6 TRANSMITTER MISC: 1.0000 | Status: DC

## 2019-01-20 MED ORDER — HYDRALAZINE HCL 20 MG/ML IJ SOLN
INTRAMUSCULAR | Status: AC
  Filled 2019-01-20: qty 1

## 2019-01-20 MED ORDER — SODIUM CHLORIDE 0.9% FLUSH: 3.0000 mL | INTRAVENOUS | Status: DC | PRN

## 2019-01-20 MED ORDER — HYDRALAZINE HCL 25 MG PO TABS
25.0000 mg | ORAL_TABLET | Freq: Three times a day (TID) | ORAL | Status: DC
  Administered 2019-01-20: 25 mg via ORAL
  Filled 2019-01-20 (×2): qty 1

## 2019-01-20 MED ORDER — LIDOCAINE HCL (PF) 1 % IJ SOLN
INTRAMUSCULAR | Status: DC | PRN
  Administered 2019-01-20: 23 mL

## 2019-01-20 MED ORDER — DEXCOM G6 RECEIVER DEVI: 1.0000 | Status: DC

## 2019-01-20 MED ORDER — HEPARIN (PORCINE) IN NACL 1000-0.9 UT/500ML-% IV SOLN
INTRAVENOUS | Status: DC | PRN
  Administered 2019-01-20 (×2): 500 mL

## 2019-01-20 MED ORDER — SODIUM CHLORIDE 0.9 % IV SOLN
INTRAVENOUS | Status: AC
  Administered 2019-01-20: 13:00:00 via INTRAVENOUS

## 2019-01-20 MED ORDER — ROSUVASTATIN CALCIUM 5 MG PO TABS: 10.0000 mg | ORAL_TABLET | Freq: Every day | ORAL | Status: DC

## 2019-01-20 MED ORDER — ASPIRIN 81 MG PO CHEW: 81.0000 mg | CHEWABLE_TABLET | Freq: Every day | ORAL | Status: DC

## 2019-01-20 MED ORDER — SODIUM CHLORIDE 0.9 % IV SOLN
INTRAVENOUS | Status: DC
  Administered 2019-01-21: 05:00:00 via INTRAVENOUS

## 2019-01-20 MED ORDER — ATORVASTATIN CALCIUM 80 MG PO TABS: 80.0000 mg | ORAL_TABLET | Freq: Every day | ORAL | Status: DC

## 2019-01-20 MED ORDER — MORPHINE SULFATE (PF) 2 MG/ML IV SOLN
2.0000 mg | INTRAVENOUS | Status: DC | PRN
  Administered 2019-01-22: 2 mg via INTRAVENOUS
  Filled 2019-01-20: qty 1

## 2019-01-20 MED ORDER — ACETAMINOPHEN 325 MG PO TABS
650.0000 mg | ORAL_TABLET | ORAL | Status: DC | PRN
  Administered 2019-01-23: 650 mg via ORAL
  Filled 2019-01-20: qty 2

## 2019-01-20 MED ORDER — HYDRALAZINE HCL 50 MG PO TABS
50.0000 mg | ORAL_TABLET | Freq: Three times a day (TID) | ORAL | Status: DC
  Administered 2019-01-20 – 2019-01-26 (×18): 50 mg via ORAL
  Filled 2019-01-20 (×18): qty 1

## 2019-01-20 MED ORDER — MIDAZOLAM HCL 2 MG/2ML IJ SOLN
INTRAMUSCULAR | Status: DC | PRN
  Administered 2019-01-20: 1 mg via INTRAVENOUS

## 2019-01-20 MED ORDER — PANTOPRAZOLE SODIUM 40 MG PO TBEC
40.0000 mg | DELAYED_RELEASE_TABLET | Freq: Every day | ORAL | Status: DC
  Administered 2019-01-20 – 2019-01-26 (×6): 40 mg via ORAL
  Filled 2019-01-20 (×6): qty 1

## 2019-01-20 SURGICAL SUPPLY — 9 items
CATH INFINITI 5FR JL4 (CATHETERS) ×1 IMPLANT
CATH INFINITI JR4 5F (CATHETERS) ×1 IMPLANT
KIT HEART LEFT (KITS) ×2 IMPLANT
PACK CARDIAC CATHETERIZATION (CUSTOM PROCEDURE TRAY) ×2 IMPLANT
SHEATH PINNACLE 5F 10CM (SHEATH) ×1 IMPLANT
SYR MEDRAD MARK 7 150ML (SYRINGE) ×2 IMPLANT
TRANSDUCER W/STOPCOCK (MISCELLANEOUS) ×2 IMPLANT
TUBING CIL FLEX 10 FLL-RA (TUBING) ×2 IMPLANT
WIRE EMERALD 3MM-J .035X150CM (WIRE) ×1 IMPLANT

## 2019-01-20 NOTE — Plan of Care (Signed)
  Problem: Activity: Goal: Ability to return to baseline activity level will improve Outcome: Progressing   Problem: Cardiovascular: Goal: Ability to achieve and maintain adequate cardiovascular perfusion will improve Outcome: Progressing Goal: Vascular access site(s) Level 0-1 will be maintained Outcome: Progressing   

## 2019-01-20 NOTE — Progress Notes (Signed)
54fr sheath aspirated and removed from RFA, manual pressure applied for 20 minutes. Site level 0 , no s+s of hematoma. Tegaderm dressing applied, bedrest instructions given.   Right dp present with doppler, right pt absent.   Bedrest begins at 12:05:00

## 2019-01-20 NOTE — Progress Notes (Addendum)
Pt left unit in bed accompanied for cath lab.

## 2019-01-20 NOTE — H&P (Addendum)
   Pre-reviewed. Very tortuous and diffusely diseased left coronary. High risk PCI target due to noted anatomy and calcium Will not be easy and will not be a low contrast case. Predict increased creat tomorrow which was alredy heading up today.  Will need to discuss with team before proceeding.Clinical scenario is high risk for complications and difficulty delivering stent.May be better off with off pump LIMA and accept dialysis.

## 2019-01-20 NOTE — Plan of Care (Signed)
  Problem: Cardiovascular: Goal: Ability to achieve and maintain adequate cardiovascular perfusion will improve Outcome: Progressing   Problem: Education: Goal: Knowledge of General Education information will improve Description: Including pain rating scale, medication(s)/side effects and non-pharmacologic comfort measures Outcome: Progressing   Problem: Clinical Measurements: Goal: Ability to maintain clinical measurements within normal limits will improve Outcome: Progressing Goal: Will remain free from infection Outcome: Progressing Goal: Respiratory complications will improve Outcome: Progressing   Problem: Nutrition: Goal: Adequate nutrition will be maintained Outcome: Progressing

## 2019-01-20 NOTE — H&P (View-Only) (Signed)
Progress Note  Patient Name: Chase Clark Date of Encounter: 01/20/2019  Primary Cardiologist: Bartholome Bill MD  Subjective   Patient feels well this am. Did have some recurrent chest pain yesterday afternoon and evening. Mild.   Inpatient Medications    Scheduled Meds:  acetylcysteine  1,200 mg Oral BID   amLODipine  10 mg Oral Daily   aspirin EC  81 mg Oral Daily   gabapentin  300 mg Oral QPC breakfast   hydrALAZINE  25 mg Oral Q8H   insulin aspart  0-15 Units Subcutaneous TID WC   metoprolol tartrate  50 mg Oral BID   rosuvastatin  40 mg Oral q1800   sodium chloride flush  3 mL Intravenous Once   sodium chloride flush  3 mL Intravenous Q12H   Continuous Infusions:  sodium chloride     sodium chloride 10 mL/hr at 01/20/19 0511   heparin 1,550 Units/hr (01/19/19 1808)   PRN Meds: sodium chloride, sodium chloride flush   Vital Signs    Vitals:   01/19/19 1945 01/19/19 2332 01/20/19 0337 01/20/19 0631  BP: (!) 144/81 (!) 142/69 (!) 148/77   Pulse: 70 66 65   Resp:  13 15   Temp: 98.4 F (36.9 C) 98.1 F (36.7 C) 98.1 F (36.7 C)   TempSrc: Oral Oral Oral   SpO2: 96% 95% 98%   Weight: 109.5 kg   108.3 kg  Height: 5\' 10"  (1.778 m)       Intake/Output Summary (Last 24 hours) at 01/20/2019 0718 Last data filed at 01/20/2019 3559 Gross per 24 hour  Intake 470.17 ml  Output 2375 ml  Net -1904.83 ml   Last 3 Weights 01/20/2019 01/19/2019 01/19/2019  Weight (lbs) 238 lb 12.1 oz 241 lb 6.5 oz 248 lb  Weight (kg) 108.3 kg 109.5 kg 112.492 kg      Telemetry    NSR - Personally Reviewed  ECG    None yet today - Personally Reviewed  Physical Exam   GEN: No acute distress.   Neck: No JVD Cardiac: RRR, no murmurs, rubs, or gallops.  Respiratory: Clear to auscultation bilaterally. GI: Soft, nontender, mildly distended  MS: 2-3+ edema; No deformity. Neuro:  Nonfocal  Psych: Normal affect   Labs    High Sensitivity Troponin:   Recent Labs    Lab 01/19/19 0833 01/19/19 0923  TROPONINIHS 10 10      Chemistry Recent Labs  Lab 01/19/19 0833 01/19/19 0923 01/20/19 0029  NA 139 138 137  K 4.5 4.4 4.0  CL 106 105 104  CO2 23 25 25   GLUCOSE 102* 97 198*  BUN 28* 28* 30*  CREATININE 2.84* 2.89* 3.09*  CALCIUM 9.0 8.5* 8.3*  PROT  --  6.0* 5.4*  ALBUMIN  --  2.8* 2.5*  AST  --  21 18  ALT  --  16 14  ALKPHOS  --  64 60  BILITOT  --  1.2 1.0  GFRNONAA 24* 23* 22*  GFRAA 28* 27* 25*  ANIONGAP 10 8 8      Hematology Recent Labs  Lab 01/19/19 0833 01/20/19 0029  WBC 8.4 6.9  RBC 3.95* 3.47*  HGB 11.6* 10.5*  HCT 36.8* 31.2*  MCV 93.2 89.9  MCH 29.4 30.3  MCHC 31.5 33.7  RDW 13.8 13.7  PLT 197 161    BNPNo results for input(s): BNP, PROBNP in the last 168 hours.   DDimer No results for input(s): DDIMER in the last 168 hours.  Radiology    Dg Chest 2 View  Result Date: 01/19/2019 CLINICAL DATA:  Increased swelling and shortness of breath for the past few days, renal insufficiency, chest pressure with pain into LEFT arm, worsened swelling in abdomen, exertional shortness of breath, hypertension, diabetes mellitus cart heart disease/coronary artery disease, asthma EXAM: CHEST - 2 VIEW COMPARISON:  09/08/2017 FINDINGS: Upper normal heart size. Mediastinal contours and pulmonary vascularity normal. Lungs clear. No infiltrate, pleural effusion or pneumothorax. Prior cervical spine fusion. IMPRESSION: No acute abnormalities. Electronically Signed   By: Lavonia Dana M.D.   On: 01/19/2019 10:49   US Renal  Result Date: 01/19/2019 CLINICAL DATA:  Acute kidney injury. EXAM: RENAL / URINARY TRACT ULTRASOUND COMPLETE COMPARISON:  Renal ultrasound 04/29/2017. FINDINGS: Right Kidney: Renal measurements: 12.1 x 5.2 x 5.5 cm = volume: 182.2 mL . Echogenicity is mildly increased. No mass or hydronephrosis visualized. Left Kidney: Renal measurements: 12.7 x 5.8 x 5.4 cm = volume: 208.4 mL. Echogenicity is mildly increased. No  mass or hydronephrosis visualized. Bladder: Appears normal for degree of bladder distention. IMPRESSION: Negative for hydronephrosis or other acute abnormality. Mildly increased cortical echogenicity is compatible with medical renal disease and unchanged compared to the prior exam. Electronically Signed   By: Inge Rise M.D.   On: 01/19/2019 12:05    Cardiac Studies    Echo:  IMPRESSIONS    1. Left ventricular ejection fraction, by visual estimation, is 55 to 60%. The left ventricle has normal function. Normal left ventricular size. There is no left ventricular hypertrophy.  2. Global right ventricle has normal systolic function.The right ventricular size is normal. No increase in right ventricular wall thickness.  3. Left atrial size was mildly dilated.  4. Right atrial size was normal.  5. The mitral valve is normal in structure. Mild mitral valve regurgitation. No evidence of mitral stenosis.  6. The tricuspid valve is normal in structure. Tricuspid valve regurgitation is trivial.  7. The aortic valve is tricuspid Aortic valve regurgitation was not visualized by color flow Doppler. Mild aortic valve sclerosis without stenosis.  8. The pulmonic valve was normal in structure. Pulmonic valve regurgitation is not visualized by color flow Doppler.  9. Normal pulmonary artery systolic pressure. 10. The inferior vena cava is normal in size with greater than 50% respiratory variability, suggesting right atrial pressure of 3 mmHg.  Patient Profile     53 y.o. male with a history of hypertension, uncontrolled diabetes mellitus, hyperlipidemia, mild PAD, CKD stage III with proteinuria, CAD with silent ischemia s/p LHC with DES to DIAG in 2011 at [facility] here with chest pain concerning for myocardial infarction as initial EKG reviewed T wave inversion and minimal ST elevation at the inferior lateral leads however now chest pain-free with repeat EKG  unremarkable.  Assessment & Plan    1. Precordial chest pain/ Aborted lateral MI: 53 year old gentleman with prior history of HTN, uncontrolled diabetes mellitus, hyperlipidemia, mild PAD, CAD status post LHC with DES to DIAG vs LAD 2011 with repeat LHC in 2015 which showed 40% stenosis of proximal LAD with no flow impairment and 10% stenosis of the first diagonal stent. Presented with chest pain and initially had mild ST elevation laterally that resolved with resolution of chest pain.   Patient high-sensitivity troponin unremarkable at 10.       - Status post full dose aspirin and heparin       -Discussed with nephrology. Need to proceed with cardiac cath today. Unable to tolerate  IV hydration due to nephrotic syndrome and volume overload. Will need to minimize contrast load.        -Continue cardiac monitoring        -Supplemental oxygen as needed        -Echo is normal.   2.  CKD stage III with proteinuria/nephrotic syndrome. :       -Appreciate nephrology recommendation  3.  Hypertension: Initially hypertensive to 210s/100s however this spontaneously           improved.      -Continue metoprolol, amlodipine  4.  Hyperlipidemia: LDL of 72.       -Continue Crestor 40 mg daily  5.  Uncontrolled diabetes mellitus: His last A1c was 11.2% on 12/17/2018.      -Continue sliding scale insulin     -Continue CBG monitoring  For questions or updates, please contact [facility] Please consult [email or web address] for contact info under        Signed, Neftaly Swiss [place], MD  01/20/2019, 7:18 AM

## 2019-01-20 NOTE — Progress Notes (Signed)
ANTICOAGULATION CONSULT NOTE  Pharmacy Consult for Heparin  Indication: chest pain/ACS  No Known Allergies  Patient Measurements: Height: 5\' 10"  (177.8 cm) Weight: 241 lb 6.5 oz (109.5 kg) IBW/kg (Calculated) : 73 Heparin Dosing Weight: 97.6 kg  Vital Signs: Temp: 98.1 F (36.7 C) (10/07 2332) Temp Source: Oral (10/07 2332) BP: 142/69 (10/07 2332) Pulse Rate: 66 (10/07 2332)  Labs: Recent Labs    01/19/19 0833 01/19/19 0843 01/19/19 0923 01/19/19 1700 01/20/19 0029  HGB 11.6*  --   --   --  10.5*  HCT 36.8*  --   --   --  31.2*  PLT 197  --   --   --  161  APTT  --  30  --   --   --   LABPROT  --  14.1  --   --   --   INR  --  1.1  --   --   --   HEPARINUNFRC  --   --   --  0.23* 0.33  CREATININE 2.84*  --  2.89*  --   --   TROPONINIHS 10  --  10  --   --     Estimated Creatinine Clearance: 35.8 mL/min (A) (by C-G formula based on SCr of 2.89 mg/dL (H)).   Assessment: 53 y.o. male with chest pain for heparin   Goal of Therapy:  Heparin level 0.3-0.7 units/ml Monitor platelets by anticoagulation protocol: Yes   Plan:  Continue Heparin at current rate   Phillis Knack, PharmD, BCPS

## 2019-01-20 NOTE — Progress Notes (Signed)
[facility] Progress Note  Name: Chase Clark MRN: [id number] DOB: 04/12/1966  Chief Complaint:  Shortness of breath, fluid overload   Subjective:  Had 1.3 liters UOP charted as well as uncharted voids.  States doesn't think all urine was collected.  He can tell quite a difference in his swelling with the lasix IV x 2 doses yesterday    Review of systems:  Denies any shortness of breath this morning No chest pain this morning No n/v Npo for cath    Intake/Output Summary (Last 24 hours) at 01/20/2019 0604 Last data filed at 01/20/2019 0511 Gross per 24 hour  Intake 470.17 ml  Output 1275 ml  Net -804.83 ml    Vitals:  Vitals:   01/19/19 1915 01/19/19 1945 01/19/19 2332 01/20/19 0337  BP:  (!) 144/81 (!) 142/69 (!) 148/77  Pulse: 69 70 66 65  Resp: (!) 9  13 15   Temp:  98.4 F (36.9 C) 98.1 F (36.7 C) 98.1 F (36.7 C)  TempSrc:  Oral Oral Oral  SpO2: 96% 96% 95% 98%  Weight:  109.5 kg    Height:  5\' 10"  (1.778 m)       Physical Exam:  General adult male in bed in no acute distress HEENT normocephalic atraumatic extraocular movements intact sclera anicteric Neck supple trachea midline Lungs clear to auscultation bilaterally normal work of breathing at rest  Heart regular rate and rhythm no rubs or gallops appreciated Abdomen soft nontender nondistended Extremities 1-2 edema lower extremities/residual limb Psych normal mood and affect Neuro - alert and oriented x 3 ; provides hx  GU no foley   Medications reviewed   Labs:  BMP Latest Ref Rng & Units 01/20/2019 01/19/2019 01/19/2019  Glucose 70 - 99 mg/dL 198(H) 97 102(H)  BUN 6 - 20 mg/dL 30(H) 28(H) 28(H)  Creatinine 0.61 - 1.24 mg/dL 3.09(H) 2.89(H) 2.84(H)  BUN/Creat Ratio 6 - 22 (calc) - - -  Sodium 135 - 145 mmol/L 137 138 139  Potassium 3.5 - 5.1 mmol/L 4.0 4.4 4.5  Chloride 98 - 111 mmol/L 104 105 106  CO2 22 - 32 mmol/L 25 25 23   Calcium 8.9 - 10.3 mg/dL 8.3(L) 8.5(L) 9.0      Assessment/Plan:   # Chest pain with CAD  - initially concerning for STEMI; note code stemi was cancelled - for heart cath with timing per cardiology discretion - spoke with cardiology attending   # Fluid volume overload   - nephrotic syndrome  - Defer lasix this morning as comfortable on exam with improving edema and anticipate cath today - Confirmed with nursing - no fluids are ordered for cath  - Would provide lasix 80 mg IV once if needed for shortness of breath     # AKI  - Consider possible CKD progression as well. No hydro; mildly increased echogenicity c/w medical renal dz  - Had been planned for renal biopsy with concurrent nephrotic syndrome - ordered mucomyst   # CKD stage III  - Possible progression to CKD IV vs AKI as above.  Note gap in labs from 2019 - he may have progressed to CKD stage IV  - Likely secondary at least in part to DM as well as microvascular disease from HTN  - in light of worsening renal function would limit gabapentin to max of 300 mg daily   # Proteinuria with nephrotic syndrome  - Felt at least in part secondary to DM - ANA positive (was referred to rheum  outpt); ANCA ok and SPEP, UPEP with immunofixation acceptable, and free light chains acceptable - Note RAAS blockade has been off with worsening renal function - Defer empiric steroids for now with his acute presentation  - had referred for renal biopsy - defer for now as he is on aspirin with concern for ACS on presentation   # Hematuria - ANA positive; ANCA and complement normal - hematuria resolved on repeat pre-appt labs on last eval and here at [facility]  # HTN with CKD - Diuresis as above  - add back hydralazine - start at 25 mg TID (home dose had been increased to 50 mg TID but 140's off of the medication); watch for need to inc back to 50  # Anemia  - some component may be 2/2 CKD  - no indication for ESA for now   Claudia Desanctis, MD 01/20/2019 6:04 AM

## 2019-01-20 NOTE — Interval H&P Note (Signed)
Cath Lab Visit (complete for each Cath Lab visit)  Clinical Evaluation Leading to the Procedure:   ACS: Yes.    Non-ACS:    Anginal Classification: CCS III  Anti-ischemic medical therapy: Minimal Therapy (1 class of medications)  Non-Invasive Test Results: No non-invasive testing performed  Prior CABG: No previous CABG      History and Physical Interval Note:  01/20/2019 10:48 AM  Chase Clark  has presented today for surgery, with the diagnosis of unstable angina.  The various methods of treatment have been discussed with the patient and family. After consideration of risks, benefits and other options for treatment, the patient has consented to  Procedure(s): LEFT HEART CATH AND CORONARY ANGIOGRAPHY (N/A) as a surgical intervention.  The patient's history has been reviewed, patient examined, no change in status, stable for surgery.  I have reviewed the patient's chart and labs.  Questions were answered to the patient's satisfaction.     Quay Burow

## 2019-01-20 NOTE — Progress Notes (Signed)
Progress Note  Patient Name: Chase Clark Date of Encounter: 01/20/2019  Primary Cardiologist: Bartholome Bill MD  Subjective   Patient feels well this am. Did have some recurrent chest pain yesterday afternoon and evening. Mild.   Inpatient Medications    Scheduled Meds:  acetylcysteine  1,200 mg Oral BID   amLODipine  10 mg Oral Daily   aspirin EC  81 mg Oral Daily   gabapentin  300 mg Oral QPC breakfast   hydrALAZINE  25 mg Oral Q8H   insulin aspart  0-15 Units Subcutaneous TID WC   metoprolol tartrate  50 mg Oral BID   rosuvastatin  40 mg Oral q1800   sodium chloride flush  3 mL Intravenous Once   sodium chloride flush  3 mL Intravenous Q12H   Continuous Infusions:  sodium chloride     sodium chloride 10 mL/hr at 01/20/19 0511   heparin 1,550 Units/hr (01/19/19 1808)   PRN Meds: sodium chloride, sodium chloride flush   Vital Signs    Vitals:   01/19/19 1945 01/19/19 2332 01/20/19 0337 01/20/19 0631  BP: (!) 144/81 (!) 142/69 (!) 148/77   Pulse: 70 66 65   Resp:  13 15   Temp: 98.4 F (36.9 C) 98.1 F (36.7 C) 98.1 F (36.7 C)   TempSrc: Oral Oral Oral   SpO2: 96% 95% 98%   Weight: 109.5 kg   108.3 kg  Height: 5\' 10"  (1.778 m)       Intake/Output Summary (Last 24 hours) at 01/20/2019 0718 Last data filed at 01/20/2019 4174 Gross per 24 hour  Intake 470.17 ml  Output 2375 ml  Net -1904.83 ml   Last 3 Weights 01/20/2019 01/19/2019 01/19/2019  Weight (lbs) 238 lb 12.1 oz 241 lb 6.5 oz 248 lb  Weight (kg) 108.3 kg 109.5 kg 112.492 kg      Telemetry    NSR - Personally Reviewed  ECG    None yet today - Personally Reviewed  Physical Exam   GEN: No acute distress.   Neck: No JVD Cardiac: RRR, no murmurs, rubs, or gallops.  Respiratory: Clear to auscultation bilaterally. GI: Soft, nontender, mildly distended  MS: 2-3+ edema; No deformity. Neuro:  Nonfocal  Psych: Normal affect   Labs    High Sensitivity Troponin:   Recent Labs    Lab 01/19/19 0833 01/19/19 0923  TROPONINIHS 10 10      Chemistry Recent Labs  Lab 01/19/19 0833 01/19/19 0923 01/20/19 0029  NA 139 138 137  K 4.5 4.4 4.0  CL 106 105 104  CO2 23 25 25   GLUCOSE 102* 97 198*  BUN 28* 28* 30*  CREATININE 2.84* 2.89* 3.09*  CALCIUM 9.0 8.5* 8.3*  PROT  --  6.0* 5.4*  ALBUMIN  --  2.8* 2.5*  AST  --  21 18  ALT  --  16 14  ALKPHOS  --  64 60  BILITOT  --  1.2 1.0  GFRNONAA 24* 23* 22*  GFRAA 28* 27* 25*  ANIONGAP 10 8 8      Hematology Recent Labs  Lab 01/19/19 0833 01/20/19 0029  WBC 8.4 6.9  RBC 3.95* 3.47*  HGB 11.6* 10.5*  HCT 36.8* 31.2*  MCV 93.2 89.9  MCH 29.4 30.3  MCHC 31.5 33.7  RDW 13.8 13.7  PLT 197 161    BNPNo results for input(s): BNP, PROBNP in the last 168 hours.   DDimer No results for input(s): DDIMER in the last 168 hours.  Radiology    Dg Chest 2 View  Result Date: 01/19/2019 CLINICAL DATA:  Increased swelling and shortness of breath for the past few days, renal insufficiency, chest pressure with pain into LEFT arm, worsened swelling in abdomen, exertional shortness of breath, hypertension, diabetes mellitus cart heart disease/coronary artery disease, asthma EXAM: CHEST - 2 VIEW COMPARISON:  09/08/2017 FINDINGS: Upper normal heart size. Mediastinal contours and pulmonary vascularity normal. Lungs clear. No infiltrate, pleural effusion or pneumothorax. Prior cervical spine fusion. IMPRESSION: No acute abnormalities. Electronically Signed   By: Lavonia Dana M.D.   On: 01/19/2019 10:49   US Renal  Result Date: 01/19/2019 CLINICAL DATA:  Acute kidney injury. EXAM: RENAL / URINARY TRACT ULTRASOUND COMPLETE COMPARISON:  Renal ultrasound 04/29/2017. FINDINGS: Right Kidney: Renal measurements: 12.1 x 5.2 x 5.5 cm = volume: 182.2 mL . Echogenicity is mildly increased. No mass or hydronephrosis visualized. Left Kidney: Renal measurements: 12.7 x 5.8 x 5.4 cm = volume: 208.4 mL. Echogenicity is mildly increased. No  mass or hydronephrosis visualized. Bladder: Appears normal for degree of bladder distention. IMPRESSION: Negative for hydronephrosis or other acute abnormality. Mildly increased cortical echogenicity is compatible with medical renal disease and unchanged compared to the prior exam. Electronically Signed   By: Inge Rise M.D.   On: 01/19/2019 12:05    Cardiac Studies    Echo:  IMPRESSIONS    1. Left ventricular ejection fraction, by visual estimation, is 55 to 60%. The left ventricle has normal function. Normal left ventricular size. There is no left ventricular hypertrophy.  2. Global right ventricle has normal systolic function.The right ventricular size is normal. No increase in right ventricular wall thickness.  3. Left atrial size was mildly dilated.  4. Right atrial size was normal.  5. The mitral valve is normal in structure. Mild mitral valve regurgitation. No evidence of mitral stenosis.  6. The tricuspid valve is normal in structure. Tricuspid valve regurgitation is trivial.  7. The aortic valve is tricuspid Aortic valve regurgitation was not visualized by color flow Doppler. Mild aortic valve sclerosis without stenosis.  8. The pulmonic valve was normal in structure. Pulmonic valve regurgitation is not visualized by color flow Doppler.  9. Normal pulmonary artery systolic pressure. 10. The inferior vena cava is normal in size with greater than 50% respiratory variability, suggesting right atrial pressure of 3 mmHg.  Patient Profile     53 y.o. male with a history of hypertension, uncontrolled diabetes mellitus, hyperlipidemia, mild PAD, CKD stage III with proteinuria, CAD with silent ischemia s/p LHC with DES to DIAG in 2011 at [facility] here with chest pain concerning for myocardial infarction as initial EKG reviewed T wave inversion and minimal ST elevation at the inferior lateral leads however now chest pain-free with repeat EKG  unremarkable.  Assessment & Plan    1. Precordial chest pain/ Aborted lateral MI: 53 year old gentleman with prior history of HTN, uncontrolled diabetes mellitus, hyperlipidemia, mild PAD, CAD status post LHC with DES to DIAG vs LAD 2011 with repeat LHC in 2015 which showed 40% stenosis of proximal LAD with no flow impairment and 10% stenosis of the first diagonal stent. Presented with chest pain and initially had mild ST elevation laterally that resolved with resolution of chest pain.   Patient high-sensitivity troponin unremarkable at 10.       - Status post full dose aspirin and heparin       -Discussed with nephrology. Need to proceed with cardiac cath today. Unable to tolerate  IV hydration due to nephrotic syndrome and volume overload. Will need to minimize contrast load.        -Continue cardiac monitoring        -Supplemental oxygen as needed        -Echo is normal.   2.  CKD stage III with proteinuria/nephrotic syndrome. :       -Appreciate nephrology recommendation  3.  Hypertension: Initially hypertensive to 210s/100s however this spontaneously           improved.      -Continue metoprolol, amlodipine  4.  Hyperlipidemia: LDL of 72.       -Continue Crestor 40 mg daily  5.  Uncontrolled diabetes mellitus: His last A1c was 11.2% on 12/17/2018.      -Continue sliding scale insulin     -Continue CBG monitoring  For questions or updates, please contact [facility] Please consult [email or web address] for contact info under        Signed, Naveh Rickles [place], MD  01/20/2019, 7:18 AM

## 2019-01-20 NOTE — Progress Notes (Signed)
ANTICOAGULATION CONSULT NOTE - Follow-Up Consult  Pharmacy Consult for Heparin  Indication: chest pain/ACS  No Known Allergies  Patient Measurements: Height: 5\' 10"  (177.8 cm) Weight: 238 lb 12.1 oz (108.3 kg)(without leg) IBW/kg (Calculated) : 73 Heparin Dosing Weight: 97.6 kg  Vital Signs: Temp: 98 F (36.7 C) (10/08 0800) Temp Source: Oral (10/08 0800) BP: 177/94 (10/08 1119) Pulse Rate: 69 (10/08 1119)  Labs: Recent Labs    01/19/19 0833 01/19/19 0843 01/19/19 0923 01/19/19 1700 01/20/19 0029  HGB 11.6*  --   --   --  10.5*  HCT 36.8*  --   --   --  31.2*  PLT 197  --   --   --  161  APTT  --  30  --   --   --   LABPROT  --  14.1  --   --   --   INR  --  1.1  --   --   --   HEPARINUNFRC  --   --   --  0.23* 0.33  CREATININE 2.84*  --  2.89*  --  3.09*  TROPONINIHS 10  --  10  --   --     Estimated Creatinine Clearance: 33.3 mL/min (A) (by C-G formula based on SCr of 3.09 mg/dL (H)).   Medical History: Past Medical History:  Diagnosis Date  . Arthritis   . Asthma    as child  . Coronary artery disease    DES DIAG1 11/20/09 [facility])  . Diabetes mellitus without complication ([place])   . Diabetic osteomyelitis ([place])   . Diabetic ulcer of foot with muscle involvement without evidence of necrosis ([place])    DIABETIC ULCERATIONS ASSOCIATED WITH IRRITATION LATERAL ANKLE LEFT GREATER THAN RIGHT WITH MILD CELLULITIS   . Diverticulosis   . DKA (diabetic ketoacidoses) ([place]) 08/23/2017  . Edema, lower extremity   . Fatty liver   . GERD (gastroesophageal reflux disease)   . Gilbert's disease   . Glaucoma   . Heart disease 2011   Patient has stent for 80% blockage  . Hyperlipidemia   . Hypertension   . Neuropathy   . Seasonal allergies   . Shoulder pain   . Ulcer of foot ([place]) 08.06.2014   DIABETIC ULCERATIONS ASSOCIATED WITH IRRITATION LATERAL ANKLE LEFT GREATER THAN RIGHT WITH MILD CELLULITIS    Assessment: 62 YOM with known CAD (stent 2011), CKD, DM, HTN, HLD  presented to [facility] ED with swelling, SOB, and chest pressure with pain to Left arm. Pt started on IV heparin, now s/p cath with plans for staged PCI tomorrow (10/9) given CKD and concern for contrast load. Pharmacy to resume heparin in 4hr with no bolus., sheath removed ~1115.   Goal of Therapy:  Heparin level 0.3-0.7 units/ml Monitor platelets by anticoagulation protocol: Yes   Plan:  -Restart heparin 1350 units/hr with no bolus at 1530 -Check 8hr heparin level   Chase Clark, PharmD, BCPS Clinical Pharmacist [phone] Please check AMION for all [place] numbers 01/20/2019

## 2019-01-20 NOTE — Progress Notes (Signed)
Pt returned from cardiac cath , on bedrest for 4 hours , site is at 0.comfortable in bed. Supine position.

## 2019-01-21 ENCOUNTER — Encounter (HOSPITAL_COMMUNITY)
Admission: EM | Disposition: A | Discharge status: Home / Self Care | Payer: Self-pay | Source: Home / Self Care | Attending: Cardiology

## 2019-01-21 ENCOUNTER — Encounter (HOSPITAL_COMMUNITY): Payer: Self-pay | Admitting: Interventional Cardiology

## 2019-01-21 DIAGNOSIS — Z01810 Encounter for preprocedural cardiovascular examination: Secondary | ICD-10-CM | POA: Diagnosis not present

## 2019-01-21 DIAGNOSIS — E1139 Type 2 diabetes mellitus with other diabetic ophthalmic complication: Secondary | ICD-10-CM | POA: Diagnosis not present

## 2019-01-21 DIAGNOSIS — I10 Essential (primary) hypertension: Secondary | ICD-10-CM

## 2019-01-21 DIAGNOSIS — I213 ST elevation (STEMI) myocardial infarction of unspecified site: Secondary | ICD-10-CM | POA: Diagnosis not present

## 2019-01-21 DIAGNOSIS — I25119 Atherosclerotic heart disease of native coronary artery with unspecified angina pectoris: Secondary | ICD-10-CM | POA: Diagnosis not present

## 2019-01-21 DIAGNOSIS — E8770 Fluid overload, unspecified: Secondary | ICD-10-CM | POA: Diagnosis not present

## 2019-01-21 DIAGNOSIS — N049 Nephrotic syndrome with unspecified morphologic changes: Secondary | ICD-10-CM | POA: Diagnosis not present

## 2019-01-21 DIAGNOSIS — Z20828 Contact with and (suspected) exposure to other viral communicable diseases: Secondary | ICD-10-CM | POA: Diagnosis not present

## 2019-01-21 DIAGNOSIS — I200 Unstable angina: Secondary | ICD-10-CM | POA: Diagnosis not present

## 2019-01-21 DIAGNOSIS — I129 Hypertensive chronic kidney disease with stage 1 through stage 4 chronic kidney disease, or unspecified chronic kidney disease: Secondary | ICD-10-CM | POA: Diagnosis not present

## 2019-01-21 DIAGNOSIS — I2129 ST elevation (STEMI) myocardial infarction involving other sites: Secondary | ICD-10-CM | POA: Diagnosis not present

## 2019-01-21 DIAGNOSIS — I25110 Atherosclerotic heart disease of native coronary artery with unstable angina pectoris: Secondary | ICD-10-CM | POA: Diagnosis not present

## 2019-01-21 DIAGNOSIS — N179 Acute kidney failure, unspecified: Secondary | ICD-10-CM | POA: Diagnosis not present

## 2019-01-21 DIAGNOSIS — N183 Chronic kidney disease, stage 3 unspecified: Secondary | ICD-10-CM | POA: Diagnosis not present

## 2019-01-21 HISTORY — PX: CORONARY STENT INTERVENTION: CATH118234

## 2019-01-21 LAB — GLUCOSE, CAPILLARY
Glucose-Capillary: 166 mg/dL — ABNORMAL HIGH (ref 70–99)
Glucose-Capillary: 270 mg/dL — ABNORMAL HIGH (ref 70–99)
Glucose-Capillary: 147 mg/dL — ABNORMAL HIGH (ref 70–99)
Glucose-Capillary: 187 mg/dL — ABNORMAL HIGH (ref 70–99)

## 2019-01-21 LAB — POCT ACTIVATED CLOTTING TIME
Activated Clotting Time: 142 seconds
Activated Clotting Time: 147 seconds
Activated Clotting Time: 301 seconds

## 2019-01-21 LAB — BASIC METABOLIC PANEL
Anion gap: 11 (ref 5–15)
BUN: 28 mg/dL — ABNORMAL HIGH (ref 6–20)
CO2: 24 mmol/L (ref 22–32)
Calcium: 8.6 mg/dL — ABNORMAL LOW (ref 8.9–10.3)
Chloride: 101 mmol/L (ref 98–111)
Creatinine, Ser: 2.78 mg/dL — ABNORMAL HIGH (ref 0.61–1.24)
GFR calc Af Amer: 28 mL/min — ABNORMAL LOW (ref >60)
GFR calc non Af Amer: 25 mL/min — ABNORMAL LOW (ref >60)
Glucose, Bld: 189 mg/dL — ABNORMAL HIGH (ref 70–99)
Potassium: 4 mmol/L (ref 3.5–5.1)
Sodium: 136 mmol/L (ref 135–145)

## 2019-01-21 LAB — CBC
HCT: 31.5 % — ABNORMAL LOW (ref 39.0–52.0)
Hemoglobin: 10.7 g/dL — ABNORMAL LOW (ref 13.0–17.0)
MCH: 30.1 pg (ref 26.0–34.0)
MCHC: 34 g/dL (ref 30.0–36.0)
MCV: 88.5 fL (ref 80.0–100.0)
Platelets: 160 10*3/uL (ref 150–400)
RBC: 3.56 MIL/uL — ABNORMAL LOW (ref 4.22–5.81)
RDW: 13.3 % (ref 11.5–15.5)
WBC: 7 10*3/uL (ref 4.0–10.5)
nRBC: 0 % (ref 0.0–0.2)

## 2019-01-21 LAB — HEPARIN LEVEL (UNFRACTIONATED): Heparin Unfractionated: 0.22 IU/mL — ABNORMAL LOW (ref 0.30–0.70)

## 2019-01-21 SURGERY — CORONARY STENT INTERVENTION
Anesthesia: LOCAL

## 2019-01-21 MED ORDER — HEPARIN (PORCINE) 25000 UT/250ML-% IV SOLN
1900.0000 [IU]/h | INTRAVENOUS | Status: DC
  Administered 2019-01-21 – 2019-01-25 (×7): 1700 [IU]/h via INTRAVENOUS
  Administered 2019-01-26: 1900 [IU]/h via INTRAVENOUS
  Filled 2019-01-21 (×9): qty 250

## 2019-01-21 MED ORDER — IOHEXOL 350 MG/ML SOLN
INTRAVENOUS | Status: DC | PRN
  Administered 2019-01-21: 95 mL via INTRA_ARTERIAL

## 2019-01-21 MED ORDER — HEPARIN (PORCINE) IN NACL 1000-0.9 UT/500ML-% IV SOLN
INTRAVENOUS | Status: AC
  Filled 2019-01-21: qty 1000

## 2019-01-21 MED ORDER — HEPARIN (PORCINE) IN NACL 1000-0.9 UT/500ML-% IV SOLN
INTRAVENOUS | Status: DC | PRN
  Administered 2019-01-21 (×2): 500 mL

## 2019-01-21 MED ORDER — BIVALIRUDIN BOLUS VIA INFUSION - CUPID
INTRAVENOUS | Status: DC | PRN
  Administered 2019-01-21 (×2): 81.45 mg via INTRAVENOUS

## 2019-01-21 MED ORDER — FENTANYL CITRATE (PF) 100 MCG/2ML IJ SOLN
INTRAMUSCULAR | Status: AC
  Filled 2019-01-21: qty 2

## 2019-01-21 MED ORDER — FUROSEMIDE 10 MG/ML IJ SOLN
40.0000 mg | Freq: Two times a day (BID) | INTRAMUSCULAR | Status: DC
  Administered 2019-01-21 – 2019-01-22 (×2): 40 mg via INTRAVENOUS
  Filled 2019-01-21 (×2): qty 4

## 2019-01-21 MED ORDER — LIDOCAINE HCL (PF) 1 % IJ SOLN
INTRAMUSCULAR | Status: AC
  Filled 2019-01-21: qty 30

## 2019-01-21 MED ORDER — CLOPIDOGREL BISULFATE 300 MG PO TABS
600.0000 mg | ORAL_TABLET | Freq: Once | ORAL | Status: AC
  Administered 2019-01-21: 600 mg via ORAL
  Filled 2019-01-21: qty 2

## 2019-01-21 MED ORDER — FENTANYL CITRATE (PF) 100 MCG/2ML IJ SOLN
INTRAMUSCULAR | Status: DC | PRN
  Administered 2019-01-21: 25 ug via INTRAVENOUS
  Administered 2019-01-21: 50 ug via INTRAVENOUS

## 2019-01-21 MED ORDER — SODIUM CHLORIDE 0.9 % IV SOLN
INTRAVENOUS | Status: DC | PRN
  Administered 2019-01-21 (×2): 1.75 mg/kg/h via INTRAVENOUS

## 2019-01-21 MED ORDER — MIDAZOLAM HCL 2 MG/2ML IJ SOLN
INTRAMUSCULAR | Status: AC
  Filled 2019-01-21: qty 2

## 2019-01-21 MED ORDER — LIDOCAINE HCL (PF) 1 % IJ SOLN
INTRAMUSCULAR | Status: DC | PRN
  Administered 2019-01-21: 30 mL via INTRADERMAL

## 2019-01-21 MED ORDER — BIVALIRUDIN TRIFLUOROACETATE 250 MG IV SOLR
INTRAVENOUS | Status: AC
  Filled 2019-01-21: qty 250

## 2019-01-21 MED ORDER — MIDAZOLAM HCL 2 MG/2ML IJ SOLN
INTRAMUSCULAR | Status: DC | PRN
  Administered 2019-01-21: 0.5 mg via INTRAVENOUS
  Administered 2019-01-21: 1 mg via INTRAVENOUS

## 2019-01-21 SURGICAL SUPPLY — 16 items
ADDWIRE .014X145 (WIRE) ×2
CATH TELEPORT (CATHETERS) ×1 IMPLANT
CATH VISTA GUIDE 6FR XB3.5 (CATHETERS) ×1 IMPLANT
CLOSURE MYNX CONTROL 6F/7F (Vascular Products) ×1 IMPLANT
ELECT DEFIB PAD ADLT CADENCE (PAD) ×1 IMPLANT
EXTENSION ADDWIRE .014X145 (WIRE) IMPLANT
KIT ENCORE 26 ADVANTAGE (KITS) ×1 IMPLANT
KIT HEART LEFT (KITS) ×2 IMPLANT
PACK CARDIAC CATHETERIZATION (CUSTOM PROCEDURE TRAY) ×2 IMPLANT
SHEATH PINNACLE 6F 10CM (SHEATH) ×1 IMPLANT
SHEATH PROBE COVER 6X72 (BAG) ×1 IMPLANT
TRANSDUCER W/STOPCOCK (MISCELLANEOUS) ×2 IMPLANT
TUBING CIL FLEX 10 FLL-RA (TUBING) ×2 IMPLANT
WIRE ASAHI PROWATER 180CM (WIRE) ×2 IMPLANT
WIRE COUGAR XT STRL 190CM (WIRE) ×1 IMPLANT
WIRE EMERALD 3MM-J .035X150CM (WIRE) ×1 IMPLANT

## 2019-01-21 NOTE — Consult Note (Signed)
[address]       [place] [address]             [phone]        Shantanu A Crossley [place] Medical Record #[id number] Date of Birth: 08-Jan-1966  Referring: No ref. provider found Primary Care: [name], MD Primary Cardiologist:No primary care provider on file.  Chief Complaint:    Chief Complaint  Patient presents with  . cp/ sob/ swelling    History of Present Illness:     53 yo male with history of DM, and HTN, presented to the ED with left shoulder and arm pain.  He was ruled in for STEMI, and taken to the cath lab, where he was noted to have LAD disease.  He was taken back to the cath today for PCI, but due to tortuosity and calcifications, wires failed to cross the lesion.  CTS has been consulted to assist with management.     Past Medical and Surgical History: Previous Chest Surgery: no Previous Chest Radiation: no Diabetes Mellitus: yes.  HbA1C 9.3 Creatinine: 2.78  Past Medical History:  Diagnosis Date  . Arthritis   . Asthma    as child  . Coronary artery disease    DES DIAG1 11/20/09 [facility])  . Diabetes mellitus without complication ([place])   . Diabetic osteomyelitis ([place])   . Diabetic ulcer of foot with muscle involvement without evidence of necrosis ([place])    DIABETIC ULCERATIONS ASSOCIATED WITH IRRITATION LATERAL ANKLE LEFT GREATER THAN RIGHT WITH MILD CELLULITIS   . Diverticulosis   . DKA (diabetic ketoacidoses) ([place]) 08/23/2017  . Edema, lower extremity   . Fatty liver   . GERD (gastroesophageal reflux disease)   . Gilbert's disease   . Glaucoma   . Heart disease 2011   Patient has stent for 80% blockage  . Hyperlipidemia   . Hypertension   . Neuropathy   . Seasonal allergies   . Shoulder pain   . Ulcer of foot ([place]) 08.06.2014   DIABETIC ULCERATIONS ASSOCIATED WITH IRRITATION LATERAL ANKLE LEFT GREATER THAN RIGHT WITH MILD CELLULITIS    Past Surgical History:  Procedure Laterality Date  . ABDOMINAL AORTOGRAM  N/A 05/20/2016   Procedure: Abdominal Aortogram possible intervention;  Surgeon: Katha Cabal, MD;  Location: [place] [facility];  Service: Cardiovascular;  Laterality: N/A;  . AMPUTATION Left 09/05/2017   Procedure: AMPUTATION BELOW KNEE;  Surgeon: Tania Ade, MD;  Location: [place];  Service: Orthopedics;  Laterality: Left;  . ANTERIOR CERVICAL DECOMP/DISCECTOMY FUSION N/A 05/07/2017   Procedure: ANTERIOR CERVICAL DECOMPRESSION/DISCECTOMY Luevenia Maxin PROSTHESIS,PLATE/SCREWS CERVICAL FIVE - CERVICAL SIX;  Surgeon: Newman Pies, MD;  Location: [place];  Service: Neurosurgery;  Laterality: N/A;  . CARDIAC CATHETERIZATION    . CATARACT EXTRACTION W/ INTRAOCULAR LENS IMPLANT    . CATARACT EXTRACTION W/PHACO Left 02/26/2017   Procedure: CATARACT EXTRACTION PHACO AND INTRAOCULAR LENS PLACEMENT (IOC);  Surgeon: Eulogio Bear, MD;  Location: [facility];  Service: Ophthalmology;  Laterality: Left;  Lot # [id number] H [place]: 00:25.3 AP%: 6.2 CDE: 1.59   . CHOLECYSTECTOMY N/A   . CORONARY ANGIOPLASTY WITH STENT PLACEMENT  2007   1st diagonal  . CORONARY STENT INTERVENTION N/A 01/21/2019   Procedure: CORONARY STENT INTERVENTION;  Surgeon: Belva Crome, MD;  Location: [place] [facility];  Service: Cardiovascular;  Laterality: N/A;  . EPIBLEPHERON REPAIR WITH TEAR DUCT PROBING    . EYE SURGERY Right sept 29n2015  cataract extraction  . INSERTION EXPRESS TUBE SHUNT Right 09/06/2015   Procedure: INSERTION AHMED TUBE SHUNT with tutoplast allograft;  Surgeon: Eulogio Bear, MD;  Location: [facility];  Service: Ophthalmology;  Laterality: Right;  . LEFT HEART CATH AND CORONARY ANGIOGRAPHY N/A 01/20/2019   Procedure: LEFT HEART CATH AND CORONARY ANGIOGRAPHY;  Surgeon: Lorretta Harp, MD;  Location: [place] [facility];  Service: Cardiovascular;  Laterality: N/A;  . VASECTOMY        Social History   Tobacco Use  Smoking Status Former Smoker  . Types: Cigarettes  . Quit date: 03/14/2008   . Years since quitting: 10.8  Smokeless Tobacco Never Used    Social History   Substance and Sexual Activity  Alcohol Use Yes   Comment: OCCAS     No Known Allergies  Medications: Asprin: yes Statin: yes Beta Blocker: yes Ace Inhibitor: no Anti-Coagulation: Plavix loaded in the cath lab  Current Facility-Administered Medications  Medication Dose Route Frequency Provider Last Rate Last Dose  . 0.9 %  sodium chloride infusion  250 mL Intravenous PRN Lorretta Harp, MD      . acetaminophen (TYLENOL) tablet 650 mg  650 mg Oral Q4H PRN Lorretta Harp, MD      . acetylcysteine (MUCOMYST) 20 % nebulizer / oral solution 1,200 mg  1,200 mg Oral BID Lorretta Harp, MD   1,200 mg at 01/21/19 0727  . amLODipine (NORVASC) tablet 10 mg  10 mg Oral Daily Lorretta Harp, MD   10 mg at 01/20/19 0804  . aspirin EC tablet 81 mg  81 mg Oral Daily Lorretta Harp, MD   81 mg at 01/21/19 0617  . furosemide (LASIX) injection 40 mg  40 mg Intravenous Q12H Corliss Parish, MD   40 mg at 01/21/19 1416  . gabapentin (NEURONTIN) capsule 300 mg  300 mg Oral QPC breakfast Lorretta Harp, MD   300 mg at 01/20/19 0804  . heparin ADULT infusion 100 units/mL (25000 units/270mL sodium chloride 0.45%)  1,700 Units/hr Intravenous Continuous Beryle Lathe, RPH 17 mL/hr at 01/21/19 1417 1,700 Units/hr at 01/21/19 1417  . hydrALAZINE (APRESOLINE) tablet 50 mg  50 mg Oral TID Lorretta Harp, MD   50 mg at 01/20/19 2144  . insulin aspart (novoLOG) injection 0-15 Units  0-15 Units Subcutaneous TID WC Lorretta Harp, MD   8 Units at 01/21/19 1311  . metoprolol tartrate (LOPRESSOR) tablet 50 mg  50 mg Oral BID Lorretta Harp, MD   50 mg at 01/20/19 2144  . morphine 2 MG/ML injection 2 mg  2 mg Intravenous Q1H PRN Lorretta Harp, MD      . ondansetron [facility]) injection 4 mg  4 mg Intravenous Q6H PRN Lorretta Harp, MD      . pantoprazole (PROTONIX) EC tablet 40 mg  40 mg Oral  Daily Lorretta Harp, MD   40 mg at 01/20/19 2151  . rosuvastatin (CRESTOR) tablet 40 mg  40 mg Oral q1800 Lorretta Harp, MD   40 mg at 01/20/19 1704  . sodium chloride flush (NS) 0.9 % injection 3 mL  3 mL Intravenous Once Lorretta Harp, MD      . sodium chloride flush (NS) 0.9 % injection 3 mL  3 mL Intravenous Q12H Lorretta Harp, MD   3 mL at 01/20/19 2151  . sodium chloride flush (NS) 0.9 % injection 3 mL  3 mL Intravenous PRN Lorretta Harp,  MD        Medications Prior to Admission  Medication Sig Dispense Refill Last Dose  . acetaminophen (TYLENOL) 325 MG tablet Take 1 tablet (325 mg total) by mouth every 6 (six) hours as needed for mild pain (pain score 1-3 or temp > 100.5). 30 tablet 0 Past Month at Unknown time  . amLODipine (NORVASC) 10 MG tablet Take 1 tablet (10 mg total) by mouth daily. 90 tablet 3 01/19/2019 at Unknown time  . esomeprazole (NEXIUM) 20 MG capsule Take 20 mg by mouth daily.    01/19/2019 at Unknown time  . furosemide (LASIX) 40 MG tablet Take 40 mg by mouth daily.   01/19/2019 at Unknown time  . gabapentin (NEURONTIN) 300 MG capsule Take 2 capsules (600 mg total) by mouth 2 (two) times daily. (Patient taking differently: Take 300 mg by mouth 2 (two) times daily. ) 360 capsule 3 01/19/2019 at Unknown time  . hydrALAZINE (APRESOLINE) 50 MG tablet Take 50 mg by mouth 3 (three) times daily.   01/19/2019 at Unknown time  . insulin aspart (NOVOLOG) 100 UNIT/ML injection Inject up to 15 units up to 3 times daily with meals 42 mL 1 01/18/2019 at Unknown time  . Insulin Glargine (LANTUS) 100 UNIT/ML Solostar Pen Inject 60 Units into the skin 2 (two) times daily. (Patient taking differently: Inject 40 Units into the skin 2 (two) times daily. ) 45 mL 3 01/18/2019 at Unknown time  . meclizine (ANTIVERT) 12.5 MG tablet Take 1 tablet (12.5 mg total) by mouth 3 (three) times daily as needed for dizziness. 30 tablet 0 Past Month at Unknown time  . metoprolol tartrate  (LOPRESSOR) 100 MG tablet TAKE 0.5 TABLETS (50 MG TOTAL) BY MOUTH 2 (TWO) TIMES DAILY. (Patient taking differently: Take 100 mg by mouth 2 (two) times daily. ) 90 tablet 1 01/19/2019 at Unknown time  . rosuvastatin (CRESTOR) 10 MG tablet Take 1 tablet (10 mg total) by mouth daily. 90 tablet 3 01/19/2019 at Unknown time  . aspirin EC 81 MG tablet Take 81 mg by mouth daily.     . brimonidine-timolol (COMBIGAN) 0.2-0.5 % ophthalmic solution Place 1 drop into the right eye every 12 (twelve) hours.      . Continuous Blood Gluc Receiver (Nambe) [place] 1 each by Does not apply route as directed. Use to check blood glucose 5 times daily 1 Device 0   . Continuous Blood Gluc Sensor (DEXCOM G6 SENSOR) MISC 1 each by Does not apply route as directed. Place 1 sensor subcutaneously once every 10 days. 3 each 11   . Continuous Blood Gluc Transmit (DEXCOM G6 TRANSMITTER) MISC 1 each by Does not apply route as directed. Use to check blood glucose 5 times daily. Refill every 3 months. 1 each 3   . DUREZOL 0.05 % EMUL Place 1 drop into the left eye at bedtime.   1   . Insulin Pen Needle 32G X 4 MM MISC Inject 5 pens into the skin daily. Use to inject Lantus and Novolog up to 5 times daily 500 each 1   . latanoprost (XALATAN) 0.005 % ophthalmic solution Place 1 drop into both eyes at bedtime.  5     Family History  Problem Relation Age of Onset  . Hyperlipidemia Mother   . Diabetes Mother   . Liver disease Mother        NASH  . Hyperlipidemia Father   . Diabetes Father   . Heart disease Father   .  Hypertension Father      Review of Systems:   Review of Systems  Constitutional: Negative.   HENT: Negative.   Respiratory: Positive for shortness of breath.   Cardiovascular: Positive for chest pain, orthopnea and leg swelling.  Gastrointestinal: Positive for abdominal pain and nausea.  Musculoskeletal: Negative.   Skin: Negative.   Neurological: Negative.       Physical Exam: BP (!) 150/83  (BP Location: Left Arm)   Pulse 62   Temp 98.1 F (36.7 C) (Oral)   Resp 18   Ht 5\' 10"  (1.778 m)   Wt 108.6 kg   SpO2 99%   BMI 34.35 kg/m  Physical Exam  Constitutional: He is oriented to person, place, and time and well-developed, well-nourished, and in no distress. No distress.  HENT:  Head: Normocephalic and atraumatic.  Mouth/Throat: No oropharyngeal exudate.  Eyes: Conjunctivae and EOM are normal.  Neck: Normal range of motion. No tracheal deviation present.  Cardiovascular: Normal rate and normal heart sounds.  No murmur heard. Pulmonary/Chest: Effort normal and breath sounds normal. No respiratory distress.  Abdominal: Soft. He exhibits no distension.  Musculoskeletal:     Comments: Left leg surgically absent Bruising and wounds in different stages on healing along the right shin.  Neurological: He is alert and oriented to person, place, and time.  Skin: Skin is warm and dry. He is not diaphoretic.      Diagnostic Studies & Laboratory data:    Left Heart Catherization:  Acute Mrg lesion is 60% stenosed.  Previously placed 1st Diag stent (unknown type) is widely patent.  Mid LAD lesion is 90% stenosed Echo:   1. Left ventricular ejection fraction, by visual estimation, is 55 to 60%. The left ventricle has normal function. Normal left ventricular size. There is no left ventricular hypertrophy.  2. Global right ventricle has normal systolic function.The right ventricular size is normal. No increase in right ventricular wall thickness.  3. Left atrial size was mildly dilated.  4. Right atrial size was normal.  5. The mitral valve is normal in structure. Mild mitral valve regurgitation. No evidence of mitral stenosis.  6. The tricuspid valve is normal in structure. Tricuspid valve regurgitation is trivial.  7. The aortic valve is tricuspid Aortic valve regurgitation was not visualized by color flow Doppler. Mild aortic valve sclerosis without stenosis.  8. The  pulmonic valve was normal in structure. Pulmonic valve regurgitation is not visualized by color flow Doppler.  9. Normal pulmonary artery systolic pressure. 10. The inferior vena cava is normal in size with greater than 50% respiratory variability, suggesting right atrial pressure of 3 mmHg.   I have independently reviewed the above radiologic studies and discussed with the patient   Recent Lab Findings: Lab Results  Component Value Date   WBC 7.0 01/21/2019   HGB 10.7 (L) 01/21/2019   HCT 31.5 (L) 01/21/2019   PLT 160 01/21/2019   GLUCOSE 189 (H) 01/21/2019   CHOL 145 01/19/2019   TRIG 151 (H) 01/19/2019   HDL 43 01/19/2019   LDLDIRECT 130.0 12/17/2018   LDLCALC 72 01/19/2019   ALT 14 01/20/2019   AST 18 01/20/2019   NA 136 01/21/2019   K 4.0 01/21/2019   CL 101 01/21/2019   CREATININE 2.78 (H) 01/21/2019   BUN 28 (H) 01/21/2019   CO2 24 01/21/2019   TSH 3.13 12/02/2017   INR 1.1 01/19/2019   HGBA1C 9.3 (H) 01/19/2019      Assessment / Plan:   53  yo male with LAD disease, unsuitable for PCI.  Poorly controlled DM, preserved heart function.  He has received a loading dose of plavix.  Will check a P2Y12, and plt inhibition this weekend.  Will schedule for OR on Monday afternoon.  CABG X 2.      I  spent 30 minutes counseling the patient face to face.   Lajuana Matte 01/21/2019 4:39 PM

## 2019-01-21 NOTE — Progress Notes (Signed)
Subjective:  Urine not recorded today but recorded at 2300 last 24 hours BUN and crt both trended down -  Unable to do PCI-  Now looking at CABG Objective Vital signs in last 24 hours: Vitals:   01/20/19 2331 01/21/19 0246 01/21/19 0457 01/21/19 1137  BP: 118/86 (!) 160/81  (!) 150/83  Pulse: 66 66    Resp: 11     Temp: 98.2 F (36.8 C) 98.1 F (36.7 C)  98.1 F (36.7 C)  TempSrc: Oral Oral  Oral  SpO2: 98% 97%    Weight:   108.6 kg   Height:       Weight change: -3.892 kg  Intake/Output Summary (Last 24 hours) at 01/21/2019 1237 Last data filed at 01/21/2019 0400 Gross per 24 hour  Intake 188.47 ml  Output 600 ml  Net -411.53 ml    Assessment/ Plan: Pt is a 53 y.o. yo male baseline CKD and nephrotic syndrome who was admitted on 01/19/2019 with  ACS-   Assessment/Plan: 1. ACS-  S/p cath- severe LAD disease- attempted PCI- was unsuccessful- now asking for a CTS consult 2. Renal-  Baseline crt of late in the mid 2's so not that far off baseline.  Nephrotic syndrome ongoing- think there is a good possibility that is due to DM.  Wanted to do kidney biopsy at some point to rule out a more treatable etiology.  This is on hold for now.  Kidney function trending better and close to baseline as of today but that was before cardiac cath, cont to watch  3. HTN/vol-  Blood pressure variable last 24 hours- from 315 to 400 systolic - norvasc 86/PYPPJKDTOIZ 50 TID/lopressor 50 BID -  Is pretty volume overloaded, resume IV lasix 4. Anemia - not an issue as of yet requiring treatment   [name]    Labs: Basic Metabolic Panel: Recent Labs  Lab 01/19/19 0923 01/20/19 0029 01/21/19 0233  NA 138 137 136  K 4.4 4.0 4.0  CL 105 104 101  CO2 25 25 24   GLUCOSE 97 198* 189*  BUN 28* 30* 28*  CREATININE 2.89* 3.09* 2.78*  CALCIUM 8.5* 8.3* 8.6*   Liver Function Tests: Recent Labs  Lab 01/19/19 0923 01/20/19 0029  AST 21 18  ALT 16 14  ALKPHOS 64 60  BILITOT 1.2 1.0  PROT  6.0* 5.4*  ALBUMIN 2.8* 2.5*   No results for input(s): LIPASE, AMYLASE in the last 168 hours. No results for input(s): AMMONIA in the last 168 hours. CBC: Recent Labs  Lab 01/19/19 0833 01/20/19 0029 01/21/19 0233  WBC 8.4 6.9 7.0  HGB 11.6* 10.5* 10.7*  HCT 36.8* 31.2* 31.5*  MCV 93.2 89.9 88.5  PLT 197 161 160   Cardiac Enzymes: No results for input(s): CKTOTAL, CKMB, CKMBINDEX, TROPONINI in the last 168 hours. CBG: Recent Labs  Lab 01/20/19 1325 01/20/19 1643 01/20/19 2059 01/21/19 0611 01/21/19 1204  GLUCAP 141* 188* 181* 166* 270*    Iron Studies: No results for input(s): IRON, TIBC, TRANSFERRIN, FERRITIN in the last 72 hours. Studies/Results: No results found. Medications: Infusions: . sodium chloride    . heparin      Scheduled Medications: . acetylcysteine  1,200 mg Oral BID  . amLODipine  10 mg Oral Daily  . aspirin EC  81 mg Oral Daily  . furosemide  40 mg Oral Daily  . gabapentin  300 mg Oral QPC breakfast  . hydrALAZINE  50 mg Oral TID  . insulin aspart  0-15 Units Subcutaneous TID WC  . metoprolol tartrate  50 mg Oral BID  . pantoprazole  40 mg Oral Daily  . rosuvastatin  40 mg Oral q1800  . sodium chloride flush  3 mL Intravenous Once  . sodium chloride flush  3 mL Intravenous Q12H    have reviewed scheduled and prn medications.  Physical Exam: General: pleasant, NAD Heart: RRR Lungs: dec BS at bases Abdomen: distended Extremities: pitting edema to dep areas    01/21/2019,12:37 PM  LOS: 2 days

## 2019-01-21 NOTE — Progress Notes (Signed)
Inpatient Diabetes Program Recommendations  AACE/ADA: New Consensus Statement on Inpatient Glycemic Control (2015)  Target Ranges:  Prepandial:   less than 140 mg/dL      Peak postprandial:   less than 180 mg/dL (1-2 hours)      Critically ill patients:  140 - 180 mg/dL   Lab Results  Component Value Date   GLUCAP 166 (H) 01/21/2019   HGBA1C 9.3 (H) 01/19/2019    Review of Glycemic Control Results for PHUONG, MOFFATT" (MRN [id number]) as of 01/21/2019 10:36  Ref. Range 01/20/2019 06:26 01/20/2019 13:25 01/20/2019 16:43 01/20/2019 20:59 01/21/2019 06:11  Glucose-Capillary Latest Ref Range: 70 - 99 mg/dL 166 (H) 141 (H) 188 (H) 181 (H) 166 (H)   Diabetes history: DM 2 Outpatient Diabetes medications:  Lantus 40 units bid, Novolog 15 units tid with meals  Current orders for Inpatient glycemic control:  Novolog 0-15 units tid with meals  Inpatient Diabetes Program Recommendations:     Note that patient takes Lantus at home.  Please consider adding Lantus 20 units daily to hospital regimen. Also once eating consider adding Novolog meal coverage 3 units tid with meals.   Of note, A1C has improved from 11.2% to 9.3% since last A1C check.   Will follow.   Thanks  Adah Perl, RN, BC-ADM Inpatient Diabetes Coordinator Pager [phone] (8a-5p)

## 2019-01-21 NOTE — Interval H&P Note (Signed)
Cath Lab Visit (complete for each Cath Lab visit)  Clinical Evaluation Leading to the Procedure:   ACS: Yes.    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  The various methods of treatment have been discussed with the patient and family. After consideration of risks, benefits and other options for treatment, the patient has consented to  Procedure(s): CORONARY STENT INTERVENTION (N/A) as a surgical intervention.  The patient's history has been reviewed, patient examined, no change in status, stable for surgery.  I have reviewed the patient's chart and labs.  Questions were answered to the patient's satisfaction.     Chase Clark

## 2019-01-21 NOTE — Progress Notes (Signed)
Progress Note  Patient Name: Chase Clark Date of Encounter: 01/21/2019  Primary Cardiologist: Bartholome Bill MD  Subjective   Patient feels well this am. No recurrent chest pain  Inpatient Medications    Scheduled Meds:  [MAR Hold] acetylcysteine  1,200 mg Oral BID   [MAR Hold] amLODipine  10 mg Oral Daily   [MAR Hold] aspirin EC  81 mg Oral Daily   [MAR Hold] furosemide  40 mg Oral Daily   [MAR Hold] gabapentin  300 mg Oral QPC breakfast   [MAR Hold] hydrALAZINE  50 mg Oral TID   [MAR Hold] insulin aspart  0-15 Units Subcutaneous TID WC   [MAR Hold] metoprolol tartrate  50 mg Oral BID   [MAR Hold] pantoprazole  40 mg Oral Daily   [MAR Hold] rosuvastatin  40 mg Oral q1800   [MAR Hold] sodium chloride flush  3 mL Intravenous Once   [MAR Hold] sodium chloride flush  3 mL Intravenous Q12H   Continuous Infusions:  [MAR Hold] sodium chloride     sodium chloride 10 mL/hr at 01/21/19 0523   heparin 1,700 Units/hr (01/21/19 0032)   PRN Meds: [MAR Hold] sodium chloride, [MAR Hold] acetaminophen, [MAR Hold]  morphine injection, [MAR Hold] ondansetron (ZOFRAN) IV, [MAR Hold] sodium chloride flush   Vital Signs    Vitals:   01/20/19 2035 01/20/19 2331 01/21/19 0246 01/21/19 0457  BP: (!) 172/98 118/86 (!) 160/81   Pulse: 65 66 66   Resp: 14 11    Temp: 98 F (36.7 C) 98.2 F (36.8 C) 98.1 F (36.7 C)   TempSrc: Oral Oral Oral   SpO2: 96% 98% 97%   Weight:    108.6 kg  Height:        Intake/Output Summary (Last 24 hours) at 01/21/2019 1001 Last data filed at 01/21/2019 0400 Gross per 24 hour  Intake 188.47 ml  Output 600 ml  Net -411.53 ml   Last 3 Weights 01/21/2019 01/20/2019 01/19/2019  Weight (lbs) 239 lb 6.7 oz 238 lb 12.1 oz 241 lb 6.5 oz  Weight (kg) 108.6 kg 108.3 kg 109.5 kg      Telemetry    NSR - Personally Reviewed  ECG    NSR, normal- Personally Reviewed  Physical Exam   GEN: No acute distress.   Neck: No JVD Cardiac: RRR, no  murmurs, rubs, or gallops.  Respiratory: Clear to auscultation bilaterally. GI: Soft, nontender, mildly distended  MS: 2+ edema; No deformity. Left leg amputation Neuro:  Nonfocal  Psych: Normal affect   Labs    High Sensitivity Troponin:   Recent Labs  Lab 01/19/19 0833 01/19/19 0923  TROPONINIHS 10 10      Chemistry Recent Labs  Lab 01/19/19 0923 01/20/19 0029 01/21/19 0233  NA 138 137 136  K 4.4 4.0 4.0  CL 105 104 101  CO2 25 25 24   GLUCOSE 97 198* 189*  BUN 28* 30* 28*  CREATININE 2.89* 3.09* 2.78*  CALCIUM 8.5* 8.3* 8.6*  PROT 6.0* 5.4*  --   ALBUMIN 2.8* 2.5*  --   AST 21 18  --   ALT 16 14  --   ALKPHOS 64 60  --   BILITOT 1.2 1.0  --   GFRNONAA 23* 22* 25*  GFRAA 27* 25* 28*  ANIONGAP 8 8 11      Hematology Recent Labs  Lab 01/19/19 0833 01/20/19 0029 01/21/19 0233  WBC 8.4 6.9 7.0  RBC 3.95* 3.47* 3.56*  HGB 11.6* 10.5*  10.7*  HCT 36.8* 31.2* 31.5*  MCV 93.2 89.9 88.5  MCH 29.4 30.3 30.1  MCHC 31.5 33.7 34.0  RDW 13.8 13.7 13.3  PLT 197 161 160    BNPNo results for input(s): BNP, PROBNP in the last 168 hours.   DDimer No results for input(s): DDIMER in the last 168 hours.   Radiology    Dg Chest 2 View  Result Date: 01/19/2019 CLINICAL DATA:  Increased swelling and shortness of breath for the past few days, renal insufficiency, chest pressure with pain into LEFT arm, worsened swelling in abdomen, exertional shortness of breath, hypertension, diabetes mellitus cart heart disease/coronary artery disease, asthma EXAM: CHEST - 2 VIEW COMPARISON:  09/08/2017 FINDINGS: Upper normal heart size. Mediastinal contours and pulmonary vascularity normal. Lungs clear. No infiltrate, pleural effusion or pneumothorax. Prior cervical spine fusion. IMPRESSION: No acute abnormalities. Electronically Signed   By: Lavonia Dana M.D.   On: 01/19/2019 10:49   US Renal  Result Date: 01/19/2019 CLINICAL DATA:  Acute kidney injury. EXAM: RENAL / URINARY TRACT  ULTRASOUND COMPLETE COMPARISON:  Renal ultrasound 04/29/2017. FINDINGS: Right Kidney: Renal measurements: 12.1 x 5.2 x 5.5 cm = volume: 182.2 mL . Echogenicity is mildly increased. No mass or hydronephrosis visualized. Left Kidney: Renal measurements: 12.7 x 5.8 x 5.4 cm = volume: 208.4 mL. Echogenicity is mildly increased. No mass or hydronephrosis visualized. Bladder: Appears normal for degree of bladder distention. IMPRESSION: Negative for hydronephrosis or other acute abnormality. Mildly increased cortical echogenicity is compatible with medical renal disease and unchanged compared to the prior exam. Electronically Signed   By: Inge Rise M.D.   On: 01/19/2019 12:05    Cardiac Studies    Echo:  IMPRESSIONS    1. Left ventricular ejection fraction, by visual estimation, is 55 to 60%. The left ventricle has normal function. Normal left ventricular size. There is no left ventricular hypertrophy.  2. Global right ventricle has normal systolic function.The right ventricular size is normal. No increase in right ventricular wall thickness.  3. Left atrial size was mildly dilated.  4. Right atrial size was normal.  5. The mitral valve is normal in structure. Mild mitral valve regurgitation. No evidence of mitral stenosis.  6. The tricuspid valve is normal in structure. Tricuspid valve regurgitation is trivial.  7. The aortic valve is tricuspid Aortic valve regurgitation was not visualized by color flow Doppler. Mild aortic valve sclerosis without stenosis.  8. The pulmonic valve was normal in structure. Pulmonic valve regurgitation is not visualized by color flow Doppler.  9. Normal pulmonary artery systolic pressure. 10. The inferior vena cava is normal in size with greater than 50% respiratory variability, suggesting right atrial pressure of 3 mmHg.  Patient Profile     53 y.o. male with a history of hypertension, uncontrolled diabetes mellitus, hyperlipidemia, mild PAD, CKD stage III  with proteinuria, CAD with silent ischemia s/p LHC with DES to DIAG in 2011 at [facility] here with chest pain concerning for myocardial infarction as initial EKG reviewed T wave inversion and minimal ST elevation at the inferior lateral leads however now chest pain-free with repeat EKG unremarkable.  Assessment & Plan    1. Precordial chest pain/ Aborted lateral MI: 53 year old gentleman with prior history of HTN, uncontrolled diabetes mellitus, hyperlipidemia, mild PAD, CAD status post LHC with DES to [facility]  2011. Presented with chest pain and initially had mild ST elevation laterally that resolved with resolution of chest pain.   Patient high-sensitivity troponin unremarkable  at 10.       - Status post full dose aspirin        -Echo is normal.        -- cardiac cath showed severe LAD disease extending from proximal to mid vessel. Very complex anatomy with calcification and marked tortuosity. Attempted PCI today by Dr Tamala Julian unsuccessful due to inability to cross lesion with wire. Recommend CT surgery consult for LIMA to LAD. He did receive Plavix load today so will need plavix wash out.   2.  CKD stage III with proteinuria/nephrotic syndrome. :       -Appreciate nephrology recommendation       -- renal parameters stable today.       -- diuresis as per nephrology.       -- will monitor renal indices closely.   3.  Hypertension: Initially hypertensive to 210s/100s however this has        improved.      -Continue metoprolol, amlodipine      -- hydralazine added per nephrology.  4.  Hyperlipidemia: LDL of 72.       -Continue Crestor 40 mg daily  5.  Uncontrolled diabetes mellitus: His last A1c was 11.2% on 12/17/2018.      -Continue sliding scale insulin     -Continue CBG monitoring  For questions or updates, please contact [facility] Please consult [email or web address] for contact info under        Signed, Enisa Runyan [place], MD  01/21/2019, 10:01 AM

## 2019-01-21 NOTE — Progress Notes (Signed)
ANTICOAGULATION CONSULT NOTE - Follow-Up Consult  Pharmacy Consult for Heparin  Indication: chest pain/ACS  No Known Allergies  Patient Measurements: Height: 5\' 10"  (177.8 cm) Weight: 239 lb 6.7 oz (108.6 kg) IBW/kg (Calculated) : 73 Heparin Dosing Weight: 97.6 kg  Vital Signs: Temp: 98.1 F (36.7 C) (10/09 0246) Temp Source: Oral (10/09 0246) BP: 160/81 (10/09 0246) Pulse Rate: 66 (10/09 0246)  Labs: Recent Labs    01/19/19 6314 01/19/19 0843 01/19/19 0923 01/19/19 1700 01/20/19 0029 01/20/19 2349 01/21/19 0233  HGB 11.6*  --   --   --  10.5*  --  10.7*  HCT 36.8*  --   --   --  31.2*  --  31.5*  PLT 197  --   --   --  161  --  160  APTT  --  30  --   --   --   --   --   LABPROT  --  14.1  --   --   --   --   --   INR  --  1.1  --   --   --   --   --   HEPARINUNFRC  --   --   --  0.23* 0.33 0.22*  --   CREATININE 2.84*  --  2.89*  --  3.09*  --  2.78*  TROPONINIHS 10  --  10  --   --   --   --     Estimated Creatinine Clearance: 37 mL/min (A) (by C-G formula based on SCr of 2.78 mg/dL (H)).   Medical History: Past Medical History:  Diagnosis Date  . Arthritis   . Asthma    as child  . Coronary artery disease    DES DIAG1 11/20/09 [facility])  . Diabetes mellitus without complication ([place])   . Diabetic osteomyelitis ([place])   . Diabetic ulcer of foot with muscle involvement without evidence of necrosis ([place])    DIABETIC ULCERATIONS ASSOCIATED WITH IRRITATION LATERAL ANKLE LEFT GREATER THAN RIGHT WITH MILD CELLULITIS   . Diverticulosis   . DKA (diabetic ketoacidoses) ([place]) 08/23/2017  . Edema, lower extremity   . Fatty liver   . GERD (gastroesophageal reflux disease)   . Gilbert's disease   . Glaucoma   . Heart disease 2011   Patient has stent for 80% blockage  . Hyperlipidemia   . Hypertension   . Neuropathy   . Seasonal allergies   . Shoulder pain   . Ulcer of foot ([place]) 08.06.2014   DIABETIC ULCERATIONS ASSOCIATED WITH IRRITATION LATERAL ANKLE LEFT  GREATER THAN RIGHT WITH MILD CELLULITIS    Assessment: 21 YOM with known CAD (stent 2011), CKD, DM, HTN, HLD presented to [facility] ED with swelling, SOB, and chest pressure with pain to Left arm. Pt started on IV heparin. Cardiac cath showed severe LAD disease extending from proximal to mid vessel. Very complex anatomy with calcification and marked tortuosity. Attempted PCI today by Dr Tamala Julian unsuccessful due to inability to cross lesion with wire. Recommend CT surgery consult for LIMA to LAD. He did receive Plavix load today so will need plavix wash out. Possible plans for CABG on 10/12.   Pharmacy to resume heparin in 4hr with no bolus., sheath removed @ 9702.  CBC low but stable; no active bleeding or line issues per nursing  Goal of Therapy:  Heparin level 0.3-0.7 units/ml Monitor platelets by anticoagulation protocol: Yes   Plan:  -Restart heparin at 1700 units/hr with no bolus @  1300 -Check 8hr heparin level -Monitor daily heparin level and CBC -Watch for s/sx of bleeding  Kennon Holter, PharmD PGY1 Ambulatory Care Pharmacy Resident [employer] Phone: [phone] 01/21/2019

## 2019-01-21 NOTE — Progress Notes (Signed)
ANTICOAGULATION CONSULT NOTE  Pharmacy Consult for Heparin  Indication: chest pain/ACS  No Known Allergies  Patient Measurements: Height: 5\' 10"  (177.8 cm) Weight: 238 lb 12.1 oz (108.3 kg)(without leg) IBW/kg (Calculated) : 73 Heparin Dosing Weight: 97.6 kg  Vital Signs: Temp: 98.2 F (36.8 C) (10/08 2331) Temp Source: Oral (10/08 2331) BP: 118/86 (10/08 2331) Pulse Rate: 66 (10/08 2331)  Labs: Recent Labs    01/19/19 5686 01/19/19 0843 01/19/19 0923 01/19/19 1700 01/20/19 0029 01/20/19 2349  HGB 11.6*  --   --   --  10.5*  --   HCT 36.8*  --   --   --  31.2*  --   PLT 197  --   --   --  161  --   APTT  --  30  --   --   --   --   LABPROT  --  14.1  --   --   --   --   INR  --  1.1  --   --   --   --   HEPARINUNFRC  --   --   --  0.23* 0.33 0.22*  CREATININE 2.84*  --  2.89*  --  3.09*  --   TROPONINIHS 10  --  10  --   --   --     Estimated Creatinine Clearance: 33.3 mL/min (A) (by C-G formula based on SCr of 3.09 mg/dL (H)).   Assessment: 53 y.o. male with chest pain, s/p cath and awaiting PCI, for heparin   Goal of Therapy:  Heparin level 0.3-0.7 units/ml Monitor platelets by anticoagulation protocol: Yes   Plan:  Increase Heparin 1700 units/hr   Phillis Knack, PharmD, BCPS

## 2019-01-21 NOTE — CV Procedure (Addendum)
   PCI of LAD.  Right femoral access using real-time vascular ultrasound for guidance  Failed PCI due to inability to cross the lesion with a wire.  Complex maneuvers were undertaken to attempt crossing including multiple wires, and ultimately an attempt to place a microcatheter within the band to allow more specific wire configuration followed by probing, however the more proximal LAD tortuosity and calcification prevented advancement of the microcatheter into the LAD beyond the origin of the large diagonal.  The case was terminated at this point because the implications for the procedure are that stent delivery would be unlikely even if we were able to balloon the LAD segment.  Minx used for closure.  Consider off-pump LAD diagonal sequential grafting with LIMA.  Loaded with 600 mg of Plavix at 7:15 AM.  Plavix will not be continued. Case duration: 60 minutes

## 2019-01-21 NOTE — Progress Notes (Signed)
TCTS consulted for CABG evaluation. °

## 2019-01-22 ENCOUNTER — Encounter (HOSPITAL_COMMUNITY): Payer: Self-pay | Admitting: Anesthesiology

## 2019-01-22 ENCOUNTER — Inpatient Hospital Stay (HOSPITAL_COMMUNITY): Payer: BC Managed Care – PPO

## 2019-01-22 ENCOUNTER — Other Ambulatory Visit (HOSPITAL_COMMUNITY): Payer: BC Managed Care – PPO

## 2019-01-22 DIAGNOSIS — Z01810 Encounter for preprocedural cardiovascular examination: Secondary | ICD-10-CM

## 2019-01-22 DIAGNOSIS — E785 Hyperlipidemia, unspecified: Secondary | ICD-10-CM

## 2019-01-22 DIAGNOSIS — N179 Acute kidney failure, unspecified: Secondary | ICD-10-CM

## 2019-01-22 DIAGNOSIS — E1169 Type 2 diabetes mellitus with other specified complication: Secondary | ICD-10-CM

## 2019-01-22 LAB — RENAL FUNCTION PANEL
Albumin: 2.5 g/dL — ABNORMAL LOW (ref 3.5–5.0)
Anion gap: 10 (ref 5–15)
BUN: 30 mg/dL — ABNORMAL HIGH (ref 6–20)
CO2: 24 mmol/L (ref 22–32)
Calcium: 8.6 mg/dL — ABNORMAL LOW (ref 8.9–10.3)
Chloride: 102 mmol/L (ref 98–111)
Creatinine, Ser: 3.15 mg/dL — ABNORMAL HIGH (ref 0.61–1.24)
GFR calc Af Amer: 24 mL/min — ABNORMAL LOW (ref >60)
GFR calc non Af Amer: 21 mL/min — ABNORMAL LOW (ref >60)
Glucose, Bld: 208 mg/dL — ABNORMAL HIGH (ref 70–99)
Phosphorus: 4.8 mg/dL — ABNORMAL HIGH (ref 2.5–4.6)
Potassium: 4.2 mmol/L (ref 3.5–5.1)
Sodium: 136 mmol/L (ref 135–145)

## 2019-01-22 LAB — HEPARIN LEVEL (UNFRACTIONATED)
Heparin Unfractionated: 0.32 IU/mL (ref 0.30–0.70)
Heparin Unfractionated: 0.31 IU/mL (ref 0.30–0.70)

## 2019-01-22 LAB — CBC
HCT: 30.2 % — ABNORMAL LOW (ref 39.0–52.0)
Hemoglobin: 10 g/dL — ABNORMAL LOW (ref 13.0–17.0)
MCH: 29.2 pg (ref 26.0–34.0)
MCHC: 33.1 g/dL (ref 30.0–36.0)
MCV: 88.3 fL (ref 80.0–100.0)
Platelets: 160 10*3/uL (ref 150–400)
RBC: 3.42 MIL/uL — ABNORMAL LOW (ref 4.22–5.81)
RDW: 13.3 % (ref 11.5–15.5)
WBC: 7.3 10*3/uL (ref 4.0–10.5)
nRBC: 0 % (ref 0.0–0.2)

## 2019-01-22 LAB — GLUCOSE, CAPILLARY
Glucose-Capillary: 194 mg/dL — ABNORMAL HIGH (ref 70–99)
Glucose-Capillary: 227 mg/dL — ABNORMAL HIGH (ref 70–99)
Glucose-Capillary: 200 mg/dL — ABNORMAL HIGH (ref 70–99)
Glucose-Capillary: 199 mg/dL — ABNORMAL HIGH (ref 70–99)
Glucose-Capillary: 194 mg/dL — ABNORMAL HIGH (ref 70–99)

## 2019-01-22 LAB — SURGICAL PCR SCREEN
MRSA, PCR: NEGATIVE
Staphylococcus aureus: NEGATIVE

## 2019-01-22 MED ORDER — SODIUM CHLORIDE 0.9% FLUSH: 3.0000 mL | INTRAVENOUS | Status: DC | PRN

## 2019-01-22 MED ORDER — ACETAMINOPHEN 325 MG PO TABS: 650.0000 mg | ORAL_TABLET | ORAL | Status: DC | PRN

## 2019-01-22 MED ORDER — LABETALOL HCL 5 MG/ML IV SOLN: 10.0000 mg | INTRAVENOUS | Status: AC | PRN

## 2019-01-22 MED ORDER — FUROSEMIDE 10 MG/ML IJ SOLN
80.0000 mg | Freq: Two times a day (BID) | INTRAMUSCULAR | Status: DC
  Administered 2019-01-22 – 2019-01-24 (×4): 80 mg via INTRAVENOUS
  Filled 2019-01-22 (×4): qty 8

## 2019-01-22 MED ORDER — SODIUM CHLORIDE 0.9 % IV SOLN: 250.0000 mL | INTRAVENOUS | Status: DC | PRN

## 2019-01-22 MED ORDER — SALINE SPRAY 0.65 % NA SOLN
1.0000 | NASAL | Status: DC | PRN
  Filled 2019-01-22: qty 44

## 2019-01-22 MED ORDER — CALCIUM CARBONATE ANTACID 500 MG PO CHEW
1.0000 | CHEWABLE_TABLET | Freq: Four times a day (QID) | ORAL | Status: DC | PRN
  Administered 2019-01-22 – 2019-01-24 (×3): 200 mg via ORAL
  Filled 2019-01-22 (×3): qty 1

## 2019-01-22 MED ORDER — SODIUM CHLORIDE 0.9 % WEIGHT BASED INFUSION: 1.0000 mL/kg/h | INTRAVENOUS | Status: AC

## 2019-01-22 MED ORDER — SODIUM CHLORIDE 0.9% FLUSH
3.0000 mL | Freq: Two times a day (BID) | INTRAVENOUS | Status: DC
  Administered 2019-01-22 – 2019-01-26 (×7): 3 mL via INTRAVENOUS

## 2019-01-22 MED ORDER — PRASUGREL HCL 10 MG PO TABS: 60.0000 mg | ORAL_TABLET | Freq: Once | ORAL | Status: DC

## 2019-01-22 MED ORDER — ISOSORBIDE MONONITRATE ER 60 MG PO TB24
60.0000 mg | ORAL_TABLET | Freq: Every day | ORAL | Status: DC
  Administered 2019-01-22 – 2019-01-26 (×5): 60 mg via ORAL
  Filled 2019-01-22 (×5): qty 1

## 2019-01-22 MED ORDER — ASPIRIN 81 MG PO CHEW: 81.0000 mg | CHEWABLE_TABLET | Freq: Every day | ORAL | Status: DC

## 2019-01-22 MED ORDER — ALUM & MAG HYDROXIDE-SIMETH 200-200-20 MG/5ML PO SUSP
30.0000 mL | Freq: Four times a day (QID) | ORAL | Status: DC | PRN
  Administered 2019-01-22 – 2019-01-25 (×4): 30 mL via ORAL
  Filled 2019-01-22 (×5): qty 30

## 2019-01-22 MED ORDER — HYDRALAZINE HCL 20 MG/ML IJ SOLN
10.0000 mg | INTRAMUSCULAR | Status: AC | PRN
  Administered 2019-01-22: 10 mg via INTRAVENOUS
  Filled 2019-01-22: qty 1

## 2019-01-22 MED ORDER — ONDANSETRON HCL 4 MG/2ML IJ SOLN: 4.0000 mg | Freq: Four times a day (QID) | INTRAMUSCULAR | Status: DC | PRN

## 2019-01-22 MED ORDER — PRASUGREL HCL 10 MG PO TABS: 10.0000 mg | ORAL_TABLET | Freq: Every day | ORAL | Status: DC

## 2019-01-22 NOTE — Plan of Care (Signed)

## 2019-01-22 NOTE — Progress Notes (Signed)
CARDIAC REHAB PHASE I   Preop education completed with pt. Pt given IS, able to demonstrate around 1200. Pt educated on importance of walks, IS use and sternal precautions. Pt given in-the-tube sheet along with Cardiac Surgery booklet and OHS care guide. Pt admits to intermittent CP with associated SOB. Encouraged pt to get rest and listen to his symptoms. Will continue to follow throughout hospital stay.  [id number] Rufina Falco, RN BSN 01/22/2019 11:57 AM

## 2019-01-22 NOTE — Progress Notes (Signed)
ANTICOAGULATION CONSULT NOTE - Follow-Up Consult  Pharmacy Consult for Heparin  Indication: chest pain/ACS  No Known Allergies  Patient Measurements: Height: 5\' 10"  (177.8 cm) Weight: 239 lb 6.7 oz (108.6 kg) IBW/kg (Calculated) : 73 Heparin Dosing Weight: 97.6 kg  Vital Signs: Temp: 98.2 F (36.8 C) (10/09 2329) Temp Source: Oral (10/09 2329) BP: 168/77 (10/09 2329) Pulse Rate: 74 (10/09 2329)  Labs: Recent Labs    01/19/19 6213 01/19/19 0865 01/19/19 0923  01/20/19 0029 01/20/19 2349 01/21/19 0233 01/21/19 2050  HGB 11.6*  --   --   --  10.5*  --  10.7*  --   HCT 36.8*  --   --   --  31.2*  --  31.5*  --   PLT 197  --   --   --  161  --  160  --   APTT  --  30  --   --   --   --   --   --   LABPROT  --  14.1  --   --   --   --   --   --   INR  --  1.1  --   --   --   --   --   --   HEPARINUNFRC  --   --   --    < > 0.33 0.22*  --  0.32  CREATININE 2.84*  --  2.89*  --  3.09*  --  2.78*  --   TROPONINIHS 10  --  10  --   --   --   --   --    < > = values in this interval not displayed.    Estimated Creatinine Clearance: 37 mL/min (A) (by C-G formula based on SCr of 2.78 mg/dL (H)).   Medical History: Past Medical History:  Diagnosis Date  . Arthritis   . Asthma    as child  . Coronary artery disease    DES DIAG1 11/20/09 [facility])  . Diabetes mellitus without complication ([place])   . Diabetic osteomyelitis ([place])   . Diabetic ulcer of foot with muscle involvement without evidence of necrosis ([place])    DIABETIC ULCERATIONS ASSOCIATED WITH IRRITATION LATERAL ANKLE LEFT GREATER THAN RIGHT WITH MILD CELLULITIS   . Diverticulosis   . DKA (diabetic ketoacidoses) ([place]) 08/23/2017  . Edema, lower extremity   . Fatty liver   . GERD (gastroesophageal reflux disease)   . Gilbert's disease   . Glaucoma   . Heart disease 2011   Patient has stent for 80% blockage  . Hyperlipidemia   . Hypertension   . Neuropathy   . Seasonal allergies   . Shoulder pain   . Ulcer of  foot ([place]) 08.06.2014   DIABETIC ULCERATIONS ASSOCIATED WITH IRRITATION LATERAL ANKLE LEFT GREATER THAN RIGHT WITH MILD CELLULITIS    Assessment: 53 YOM with known CAD (stent 2011), CKD, DM, HTN, HLD presented to [facility] ED with swelling, SOB, and chest pressure with pain to Left arm. Pt started on IV heparin. Cardiac cath showed severe LAD disease extending from proximal to mid vessel. Very complex anatomy with calcification and marked tortuosity. Attempted PCI today by Dr Tamala Julian unsuccessful due to inability to cross lesion with wire. Recommend CT surgery consult for LIMA to LAD. He did receive Plavix load today so will need plavix wash out. Possible plans for CABG on 10/12.   Pharmacy to resume heparin in 4hr with no bolus., sheath removed @ 7846.  CBC low but stable; no active bleeding or line issues per nursing  10/10 AM update:  Tentative plans for CABG on Monday Initial heparin level after re-start is therapeutic   Goal of Therapy:  Heparin level 0.3-0.7 units/ml Monitor platelets by anticoagulation protocol: Yes   Plan:  -Cont heparin at 1700 units/hr -Confirmatory heparin level with AM labs -Monitor daily heparin level and CBC -Watch for s/sx of bleeding  Narda Bonds, PharmD, BCPS Clinical Pharmacist Phone: [phone]

## 2019-01-22 NOTE — Progress Notes (Signed)
PRE CABG has been completed.   Preliminary results in CV Proc.   Abram Sander 01/22/2019 2:27 PM

## 2019-01-22 NOTE — Progress Notes (Addendum)
Progress Note  Patient Name: Chase Clark Date of Encounter: 01/22/2019  Primary Cardiologist: Bartholome Bill MD  Subjective   The patient had another episode of chest tightness with nausea and diaphoresis, now resolved, residual heartburn.   Inpatient Medications    Scheduled Meds: . amLODipine  10 mg Oral Daily  . aspirin EC  81 mg Oral Daily  . furosemide  40 mg Intravenous Q12H  . gabapentin  300 mg Oral QPC breakfast  . hydrALAZINE  50 mg Oral TID  . insulin aspart  0-15 Units Subcutaneous TID WC  . metoprolol tartrate  50 mg Oral BID  . pantoprazole  40 mg Oral Daily  . rosuvastatin  40 mg Oral q1800  . sodium chloride flush  3 mL Intravenous Once  . sodium chloride flush  3 mL Intravenous Q12H   Continuous Infusions: . sodium chloride    . sodium chloride    . sodium chloride    . heparin 1,700 Units/hr (01/22/19 0426)   PRN Meds: sodium chloride, sodium chloride, acetaminophen, hydrALAZINE, labetalol, morphine injection, ondansetron (ZOFRAN) IV, sodium chloride flush   Vital Signs    Vitals:   01/21/19 2329 01/22/19 0417 01/22/19 0736 01/22/19 0805  BP: (!) 168/77 (!) 158/85 (!) 161/85 (!) 175/92  Pulse: 74 68 68 68  Resp: 11 13 18 18   Temp: 98.2 F (36.8 C) 98.2 F (36.8 C) 98.5 F (36.9 C) 98.6 F (37 C)  TempSrc: Oral Oral Oral Oral  SpO2: 97% 91% 95% 95%  Weight:      Height:        Intake/Output Summary (Last 24 hours) at 01/22/2019 1044 Last data filed at 01/22/2019 0655 Gross per 24 hour  Intake 529.54 ml  Output 2705 ml  Net -2175.46 ml   Last 3 Weights 01/21/2019 01/20/2019 01/19/2019  Weight (lbs) 239 lb 6.7 oz 238 lb 12.1 oz 241 lb 6.5 oz  Weight (kg) 108.6 kg 108.3 kg 109.5 kg      Telemetry    NSR - Personally Reviewed  ECG    NSR, normal- Personally Reviewed  Physical Exam   GEN: No acute distress.   Neck: No JVD Cardiac: RRR, no murmurs, rubs, or gallops.  Respiratory: Clear to auscultation bilaterally. GI: Soft,  nontender, mildly distended  MS: 2+ edema; No deformity. Left leg amputation Neuro:  Nonfocal  Psych: Normal affect   Labs    High Sensitivity Troponin:   Recent Labs  Lab 01/19/19 0833 01/19/19 0923  TROPONINIHS 10 10      Chemistry Recent Labs  Lab 01/19/19 0923 01/20/19 0029 01/21/19 0233 01/22/19 0223  NA 138 137 136 136  K 4.4 4.0 4.0 4.2  CL 105 104 101 102  CO2 25 25 24 24   GLUCOSE 97 198* 189* 208*  BUN 28* 30* 28* 30*  CREATININE 2.89* 3.09* 2.78* 3.15*  CALCIUM 8.5* 8.3* 8.6* 8.6*  PROT 6.0* 5.4*  --   --   ALBUMIN 2.8* 2.5*  --  2.5*  AST 21 18  --   --   ALT 16 14  --   --   ALKPHOS 64 60  --   --   BILITOT 1.2 1.0  --   --   GFRNONAA 23* 22* 25* 21*  GFRAA 27* 25* 28* 24*  ANIONGAP 8 8 11 10      Hematology Recent Labs  Lab 01/20/19 0029 01/21/19 0233 01/22/19 0223  WBC 6.9 7.0 7.3  RBC 3.47* 3.56* 3.42*  HGB 10.5* 10.7* 10.0*  HCT 31.2* 31.5* 30.2*  MCV 89.9 88.5 88.3  MCH 30.3 30.1 29.2  MCHC 33.7 34.0 33.1  RDW 13.7 13.3 13.3  PLT 161 160 160    BNPNo results for input(s): BNP, PROBNP in the last 168 hours.   DDimer No results for input(s): DDIMER in the last 168 hours.   Radiology    No results found.  Cardiac Studies   Echo: 01/19/2019    1. Left ventricular ejection fraction, by visual estimation, is 55 to 60%. The left ventricle has normal function. Normal left ventricular size. There is no left ventricular hypertrophy.  2. Global right ventricle has normal systolic function.The right ventricular size is normal. No increase in right ventricular wall thickness.  3. Left atrial size was mildly dilated.  4. Right atrial size was normal.  5. The mitral valve is normal in structure. Mild mitral valve regurgitation. No evidence of mitral stenosis.  6. The tricuspid valve is normal in structure. Tricuspid valve regurgitation is trivial.  7. The aortic valve is tricuspid Aortic valve regurgitation was not visualized by color flow  Doppler. Mild aortic valve sclerosis without stenosis.  8. The pulmonic valve was normal in structure. Pulmonic valve regurgitation is not visualized by color flow Doppler.  9. Normal pulmonary artery systolic pressure. 10. The inferior vena cava is normal in size with greater than 50% respiratory variability, suggesting right atrial pressure of 3 mmHg.    Patient Profile     53 y.o. male with a history of hypertension, uncontrolled diabetes mellitus, hyperlipidemia, mild PAD, CKD stage III with proteinuria, CAD with silent ischemia s/p LHC with DES to DIAG in 2011 at [facility] here with chest pain concerning for myocardial infarction as initial EKG reviewed T wave inversion and minimal ST elevation at the inferior lateral leads however now chest pain-free with repeat EKG unremarkable.  Assessment & Plan    1. Precordial chest pain/ Aborted lateral MI: 53 year old gentleman with prior history of HTN, uncontrolled diabetes mellitus, hyperlipidemia, mild PAD, CAD status post LHC with DES to [facility]  2011. Presented with chest pain and initially had mild ST elevation laterally that resolved with resolution of chest pain.   Patient high-sensitivity troponin unremarkable at 10.       - Status post full dose aspirin        -Echo is normal.        -- cardiac cath showed severe LAD disease extending from proximal to mid vessel. Very complex anatomy with calcification and marked tortuosity. Attempted PCI today by Dr Tamala Julian unsuccessful due to inability to cross lesion with wire. Recommend CT surgery consult for LIMA to LAD. He did receive Plavix load today so will need plavix wash out.  - another episode of typical chest pain this mornig, now asymptomatic, his ECG was unchanged and normal during the episode, very hypertensive, I will start Imdur 60 mg po daily and monitor closely - he is scheduled for a CABG on Monday, but if he has more episodes I might contact CT surgery for earlier surgery   2.  CKD stage III with proteinuria/nephrotic syndrome. :       -Appreciate nephrology recommendation       -- renal parameters stable today.       -- diuresis - I will increase lasix to 80 mg IV BID x 2 doses.       -- will monitor renal indices closely.   3.  Hypertension: Initially hypertensive  to 210s/100s however this has        improved.      -Continue metoprolol, amlodipine      -- hydralazine added per nephrology.  4.  Hyperlipidemia: LDL of 72.       -Continue Crestor 40 mg daily  5.  Uncontrolled diabetes mellitus: His last A1c was 11.2% on 12/17/2018.      -Continue sliding scale insulin     -Continue CBG monitoring  For questions or updates, please contact [facility] Please consult [email or web address] for contact info under        Signed, Ena Dawley, MD  01/22/2019, 10:44 AM

## 2019-01-22 NOTE — Progress Notes (Signed)
ANTICOAGULATION CONSULT NOTE - Follow-Up Consult  Pharmacy Consult for Heparin  Indication: chest pain/ACS  No Known Allergies  Patient Measurements: Height: 5\' 10"  (177.8 cm) Weight: 239 lb 6.7 oz (108.6 kg) IBW/kg (Calculated) : 73 Heparin Dosing Weight: 97.6 kg  Vital Signs: Temp: 98.6 F (37 C) (10/10 0805) Temp Source: Oral (10/10 0805) BP: 175/92 (10/10 0805) Pulse Rate: 68 (10/10 0805)  Labs: Recent Labs    01/19/19 0843  01/19/19 0923  01/20/19 0029 01/20/19 2349 01/21/19 0233 01/21/19 2050 01/22/19 0223  HGB  --   --   --    < > 10.5*  --  10.7*  --  10.0*  HCT  --   --   --   --  31.2*  --  31.5*  --  30.2*  PLT  --   --   --   --  161  --  160  --  160  APTT 30  --   --   --   --   --   --   --   --   LABPROT 14.1  --   --   --   --   --   --   --   --   INR 1.1  --   --   --   --   --   --   --   --   HEPARINUNFRC  --   --   --    < > 0.33 0.22*  --  0.32 0.31  CREATININE  --    < > 2.89*  --  3.09*  --  2.78*  --  3.15*  TROPONINIHS  --   --  10  --   --   --   --   --   --    < > = values in this interval not displayed.    Estimated Creatinine Clearance: 32.7 mL/min (A) (by C-G formula based on SCr of 3.15 mg/dL (H)).   Medical History: Past Medical History:  Diagnosis Date  . Arthritis   . Asthma    as child  . Coronary artery disease    DES DIAG1 11/20/09 [facility])  . Diabetes mellitus without complication ([place])   . Diabetic osteomyelitis ([place])   . Diabetic ulcer of foot with muscle involvement without evidence of necrosis ([place])    DIABETIC ULCERATIONS ASSOCIATED WITH IRRITATION LATERAL ANKLE LEFT GREATER THAN RIGHT WITH MILD CELLULITIS   . Diverticulosis   . DKA (diabetic ketoacidoses) ([place]) 08/23/2017  . Edema, lower extremity   . Fatty liver   . GERD (gastroesophageal reflux disease)   . Gilbert's disease   . Glaucoma   . Heart disease 2011   Patient has stent for 80% blockage  . Hyperlipidemia   . Hypertension   . Neuropathy   .  Seasonal allergies   . Shoulder pain   . Ulcer of foot ([place]) 08.06.2014   DIABETIC ULCERATIONS ASSOCIATED WITH IRRITATION LATERAL ANKLE LEFT GREATER THAN RIGHT WITH MILD CELLULITIS    Assessment: 65 YOM with known CAD (stent 2011), CKD, DM, HTN, HLD presented to [facility] ED with swelling, SOB, and chest pressure with pain to Left arm. Pt started on IV heparin. Cardiac cath showed severe LAD disease extending from proximal to mid vessel. Very complex anatomy with calcification and marked tortuosity. Attempted PCI today by Dr Tamala Julian unsuccessful due to inability to cross lesion with wire. Recommend CT surgery consult for LIMA to LAD. He did receive  Plavix load today so will need plavix wash out. Possible plans for CABG on 10/12.  Heparin level continues to be at goal x2 this morning.  CBC low but stable; no active bleeding or line issues per nursing  Goal of Therapy:  Heparin level 0.3-0.7 units/ml Monitor platelets by anticoagulation protocol: Yes   Plan:  -Continue heparin at 1700 units/hr -Monitor daily heparin level and CBC -Watch for s/sx of bleeding -Tentative plan for CABG on Monday  Erin Hearing PharmD., BCPS Clinical Pharmacist 01/22/2019 8:38 AM

## 2019-01-22 NOTE — Progress Notes (Signed)
Subjective:  Urine output 2200 last 24 hours BUN and crt increased slightly after cath and attempted PCI yest -    Now looking at CABG Objective Vital signs in last 24 hours: Vitals:   01/21/19 1645 01/21/19 1932 01/21/19 2329 01/22/19 0417  BP:  (!) 174/87 (!) 168/77 (!) 158/85  Pulse: 68  74 68  Resp: 11  11 13   Temp: 98.4 F (36.9 C) 97.9 F (36.6 C) 98.2 F (36.8 C) 98.2 F (36.8 C)  TempSrc: Oral Oral Oral Oral  SpO2: 98%  97% 91%  Weight:      Height:       Weight change:   Intake/Output Summary (Last 24 hours) at 01/22/2019 0700 Last data filed at 01/22/2019 0400 Gross per 24 hour  Intake 529.54 ml  Output 2205 ml  Net -1675.46 ml    Assessment/ Plan: Pt is a 53 y.o. yo male baseline CKD and nephrotic syndrome who was admitted on 01/19/2019 with  ACS-   Assessment/Plan: 1. ACS-  S/p cath- severe LAD disease- attempted PCI- was unsuccessful- now planning on CABG on Monday afternoon 2. Renal-  Baseline crt of late in the mid 2's.  Nephrotic syndrome ongoing- think there is a good possibility that is due to DM.  Wanted to do kidney biopsy at some point to rule out a more treatable etiology- arranged as OP but put on hold due to events.    Kidney function trended better pre cath - worse today but that is after contrast admin.  Good UOP - suspect will improve over the next few days so he can take on another insult on Monday 3. HTN/vol-  Blood pressure less variable last 24 hours- but high from 017 to 510 systolic - norvasc 25/ENIDPOEUMPN 50 TID/lopressor 50 BID -  Is pretty volume overloaded, resumed IV lasix to help- no change today in BP meds- cont lasix 4. Anemia - not an issue as of yet requiring treatment but trending down some   [name]    Labs: Basic Metabolic Panel: Recent Labs  Lab 01/20/19 0029 01/21/19 0233 01/22/19 0223  NA 137 136 136  K 4.0 4.0 4.2  CL 104 101 102  CO2 25 24 24   GLUCOSE 198* 189* 208*  BUN 30* 28* 30*  CREATININE 3.09*  2.78* 3.15*  CALCIUM 8.3* 8.6* 8.6*  PHOS  --   --  4.8*   Liver Function Tests: Recent Labs  Lab 01/19/19 0923 01/20/19 0029 01/22/19 0223  AST 21 18  --   ALT 16 14  --   ALKPHOS 64 60  --   BILITOT 1.2 1.0  --   PROT 6.0* 5.4*  --   ALBUMIN 2.8* 2.5* 2.5*   No results for input(s): LIPASE, AMYLASE in the last 168 hours. No results for input(s): AMMONIA in the last 168 hours. CBC: Recent Labs  Lab 01/19/19 0833 01/20/19 0029 01/21/19 0233 01/22/19 0223  WBC 8.4 6.9 7.0 7.3  HGB 11.6* 10.5* 10.7* 10.0*  HCT 36.8* 31.2* 31.5* 30.2*  MCV 93.2 89.9 88.5 88.3  PLT 197 161 160 160   Cardiac Enzymes: No results for input(s): CKTOTAL, CKMB, CKMBINDEX, TROPONINI in the last 168 hours. CBG: Recent Labs  Lab 01/21/19 0611 01/21/19 1204 01/21/19 1706 01/21/19 2130 01/22/19 0615  GLUCAP 166* 270* 147* 187* 194*    Iron Studies: No results for input(s): IRON, TIBC, TRANSFERRIN, FERRITIN in the last 72 hours. Studies/Results: No results found. Medications: Infusions: . sodium chloride    .  heparin 1,700 Units/hr (01/22/19 0426)    Scheduled Medications: . amLODipine  10 mg Oral Daily  . aspirin EC  81 mg Oral Daily  . furosemide  40 mg Intravenous Q12H  . gabapentin  300 mg Oral QPC breakfast  . hydrALAZINE  50 mg Oral TID  . insulin aspart  0-15 Units Subcutaneous TID WC  . metoprolol tartrate  50 mg Oral BID  . pantoprazole  40 mg Oral Daily  . rosuvastatin  40 mg Oral q1800  . sodium chloride flush  3 mL Intravenous Once  . sodium chloride flush  3 mL Intravenous Q12H    have reviewed scheduled and prn medications.  Physical Exam: General: pleasant, NAD Heart: RRR Lungs: dec BS at bases Abdomen: distended Extremities: pitting edema to dep areas    01/22/2019,7:00 AM  LOS: 3 days

## 2019-01-23 LAB — CBC
HCT: 28.1 % — ABNORMAL LOW (ref 39.0–52.0)
Hemoglobin: 9.2 g/dL — ABNORMAL LOW (ref 13.0–17.0)
MCH: 29.1 pg (ref 26.0–34.0)
MCHC: 32.7 g/dL (ref 30.0–36.0)
MCV: 88.9 fL (ref 80.0–100.0)
Platelets: 155 10*3/uL (ref 150–400)
RBC: 3.16 MIL/uL — ABNORMAL LOW (ref 4.22–5.81)
RDW: 13.5 % (ref 11.5–15.5)
WBC: 7.6 10*3/uL (ref 4.0–10.5)
nRBC: 0 % (ref 0.0–0.2)

## 2019-01-23 LAB — RENAL FUNCTION PANEL
Albumin: 2.4 g/dL — ABNORMAL LOW (ref 3.5–5.0)
Anion gap: 10 (ref 5–15)
BUN: 36 mg/dL — ABNORMAL HIGH (ref 6–20)
CO2: 24 mmol/L (ref 22–32)
Calcium: 8.4 mg/dL — ABNORMAL LOW (ref 8.9–10.3)
Chloride: 102 mmol/L (ref 98–111)
Creatinine, Ser: 4.35 mg/dL — ABNORMAL HIGH (ref 0.61–1.24)
GFR calc Af Amer: 17 mL/min — ABNORMAL LOW (ref >60)
GFR calc non Af Amer: 14 mL/min — ABNORMAL LOW (ref >60)
Glucose, Bld: 165 mg/dL — ABNORMAL HIGH (ref 70–99)
Phosphorus: 5.1 mg/dL — ABNORMAL HIGH (ref 2.5–4.6)
Potassium: 4.2 mmol/L (ref 3.5–5.1)
Sodium: 136 mmol/L (ref 135–145)

## 2019-01-23 LAB — GLUCOSE, CAPILLARY
Glucose-Capillary: 169 mg/dL — ABNORMAL HIGH (ref 70–99)
Glucose-Capillary: 167 mg/dL — ABNORMAL HIGH (ref 70–99)
Glucose-Capillary: 146 mg/dL — ABNORMAL HIGH (ref 70–99)
Glucose-Capillary: 185 mg/dL — ABNORMAL HIGH (ref 70–99)

## 2019-01-23 LAB — HEPARIN LEVEL (UNFRACTIONATED): Heparin Unfractionated: 0.35 IU/mL (ref 0.30–0.70)

## 2019-01-23 NOTE — Progress Notes (Signed)
T[facility] BRIEF PROGRESS NOTE  2 Days Post-Op  S/P Procedure(s) (LRB): CORONARY STENT INTERVENTION (N/A)   No chest pain last 24 hours Creatinine rising, now up to 4.35  Plan: Will postpone plans for CABG  Rexene Alberts, MD 01/23/2019 11:01 AM

## 2019-01-23 NOTE — Plan of Care (Signed)

## 2019-01-23 NOTE — Progress Notes (Signed)
Progress Note  Patient Name: Chase Clark Date of Encounter: 01/23/2019  Primary Cardiologist: Bartholome Bill MD  Subjective   Today he feels great, no chest pain, indigestion, no SOB.  Inpatient Medications    Scheduled Meds: . amLODipine  10 mg Oral Daily  . aspirin EC  81 mg Oral Daily  . furosemide  80 mg Intravenous Q12H  . gabapentin  300 mg Oral QPC breakfast  . hydrALAZINE  50 mg Oral TID  . insulin aspart  0-15 Units Subcutaneous TID WC  . isosorbide mononitrate  60 mg Oral Daily  . metoprolol tartrate  50 mg Oral BID  . pantoprazole  40 mg Oral Daily  . rosuvastatin  40 mg Oral q1800  . sodium chloride flush  3 mL Intravenous Once  . sodium chloride flush  3 mL Intravenous Q12H   Continuous Infusions: . sodium chloride    . sodium chloride    . heparin 1,700 Units/hr (01/22/19 2046)   PRN Meds: sodium chloride, sodium chloride, acetaminophen, alum & mag hydroxide-simeth, calcium carbonate, morphine injection, ondansetron (ZOFRAN) IV, sodium chloride, sodium chloride flush   Vital Signs    Vitals:   01/22/19 1700 01/22/19 1958 01/22/19 2351 01/23/19 0750  BP: (!) 148/76 136/69 (!) 142/71 135/80  Pulse: 65 66 67 67  Resp: 12 13 13 12   Temp: 98 F (36.7 C) 99 F (37.2 C) 98.5 F (36.9 C) 98.1 F (36.7 C)  TempSrc: Oral Oral Oral Oral  SpO2: 96% 97% 96% 95%  Weight:      Height:        Intake/Output Summary (Last 24 hours) at 01/23/2019 1043 Last data filed at 01/23/2019 0800 Gross per 24 hour  Intake 1128 ml  Output 1400 ml  Net -272 ml   Last 3 Weights 01/21/2019 01/20/2019 01/19/2019  Weight (lbs) 239 lb 6.7 oz 238 lb 12.1 oz 241 lb 6.5 oz  Weight (kg) 108.6 kg 108.3 kg 109.5 kg     Telemetry    NSR - Personally Reviewed  ECG    NSR, normal- Personally Reviewed  Physical Exam   GEN: No acute distress.   Neck: No JVD Cardiac: RRR, no murmurs, rubs, or gallops.  Respiratory: Clear to auscultation bilaterally. GI: Soft, nontender,  mildly distended  MS: 2+ edema; No deformity. Left leg amputation Neuro:  Nonfocal  Psych: Normal affect   Labs    High Sensitivity Troponin:   Recent Labs  Lab 01/19/19 0833 01/19/19 0923  TROPONINIHS 10 10     Chemistry Recent Labs  Lab 01/19/19 0923 01/20/19 0029 01/21/19 0233 01/22/19 0223 01/23/19 0228  NA 138 137 136 136 136  K 4.4 4.0 4.0 4.2 4.2  CL 105 104 101 102 102  CO2 25 25 24 24 24   GLUCOSE 97 198* 189* 208* 165*  BUN 28* 30* 28* 30* 36*  CREATININE 2.89* 3.09* 2.78* 3.15* 4.35*  CALCIUM 8.5* 8.3* 8.6* 8.6* 8.4*  PROT 6.0* 5.4*  --   --   --   ALBUMIN 2.8* 2.5*  --  2.5* 2.4*  AST 21 18  --   --   --   ALT 16 14  --   --   --   ALKPHOS 64 60  --   --   --   BILITOT 1.2 1.0  --   --   --   GFRNONAA 23* 22* 25* 21* 14*  GFRAA 27* 25* 28* 24* 17*  ANIONGAP 8 8 11  10  10    Hematology Recent Labs  Lab 01/21/19 0233 01/22/19 0223 01/23/19 0228  WBC 7.0 7.3 7.6  RBC 3.56* 3.42* 3.16*  HGB 10.7* 10.0* 9.2*  HCT 31.5* 30.2* 28.1*  MCV 88.5 88.3 88.9  MCH 30.1 29.2 29.1  MCHC 34.0 33.1 32.7  RDW 13.3 13.3 13.5  PLT 160 160 155   BNPNo results for input(s): BNP, PROBNP in the last 168 hours.   DDimer No results for input(s): DDIMER in the last 168 hours.   Radiology    Vas US Doppler Pre Cabg  Result Date: 01/22/2019 PREOPERATIVE VASCULAR EVALUATION  Indications:      Pre cabg. Risk Factors:     Hypertension, hyperlipidemia, Diabetes. Comparison Study: no prior Performing Technologist: Abram Sander RVS  Examination Guidelines: A complete evaluation includes B-mode imaging, spectral Doppler, color Doppler, and power Doppler as needed of all accessible portions of each vessel. Bilateral testing is considered an integral part of a complete examination. Limited examinations for reoccurring indications may be performed as noted.  Right Carotid Findings: +----------+--------+--------+--------+------------+--------+           PSV cm/sEDV  cm/sStenosisDescribe    Comments +----------+--------+--------+--------+------------+--------+ CCA Prox  94      16              heterogenous         +----------+--------+--------+--------+------------+--------+ CCA Distal91      21              heterogenous         +----------+--------+--------+--------+------------+--------+ ICA Prox  158     34      1-39%   heterogenous         +----------+--------+--------+--------+------------+--------+ ICA Distal113     38                                   +----------+--------+--------+--------+------------+--------+ ECA       157     14                                   +----------+--------+--------+--------+------------+--------+  +----------+--------+-------+--------+------------+           PSV cm/sEDV cmsDescribeArm Pressure +----------+--------+-------+--------+------------+ Subclavian87                     147          +----------+--------+-------+--------+------------+ +---------+--------+--+--------+--+---------+ VertebralPSV cm/s68EDV cm/s21Antegrade +---------+--------+--+--------+--+---------+ Left Carotid Findings: +----------+--------+--------+--------+------------+--------+           PSV cm/sEDV cm/sStenosisDescribe    Comments +----------+--------+--------+--------+------------+--------+ CCA Prox  127     23              heterogenous         +----------+--------+--------+--------+------------+--------+ CCA Distal70      18              heterogenous         +----------+--------+--------+--------+------------+--------+ ICA Prox  105     35      1-39%   heterogenous         +----------+--------+--------+--------+------------+--------+ ICA Distal58      19                                   +----------+--------+--------+--------+------------+--------+ ECA       362  33                                   +----------+--------+--------+--------+------------+--------+  +----------+--------+--------+--------+------------+ SubclavianPSV cm/sEDV cm/sDescribeArm Pressure +----------+--------+--------+--------+------------+           107                     163          +----------+--------+--------+--------+------------+ +---------+--------+--+--------+--+ VertebralPSV cm/s62EDV cm/s23 +---------+--------+--+--------+--+  ABI Findings: +--------+------------------+-----+---------+--------+ Right   Rt Pressure (mmHg)IndexWaveform Comment  +--------+------------------+-----+---------+--------+ [id number]                    triphasic         +--------+------------------+-----+---------+--------+ PTA     118               0.72 biphasic          +--------+------------------+-----+---------+--------+ DP      162               0.99 biphasic          +--------+------------------+-----+---------+--------+ +--------+------------------+-----+---------+-------+ Left    Lt Pressure (mmHg)IndexWaveform Comment +--------+------------------+-----+---------+-------+ [id number]                    triphasic        +--------+------------------+-----+---------+-------+ PTA                                     bka     +--------+------------------+-----+---------+-------+ DP                                      bka     +--------+------------------+-----+---------+-------+ +-------+---------------+----------------+ ABI/TBIToday's ABI/TBIPrevious ABI/TBI +-------+---------------+----------------+ Right  0.99                            +-------+---------------+----------------+  Right Doppler Findings: +--------+--------+-----+---------+--------+ Site    PressureIndexDoppler  Comments +--------+--------+-----+---------+--------+ [id number]          triphasic         +--------+--------+-----+---------+--------+ Radial               triphasic         +--------+--------+-----+---------+--------+ Ulnar                 triphasic         +--------+--------+-----+---------+--------+  Left Doppler Findings: +--------+--------+-----+---------+--------+ Site    PressureIndexDoppler  Comments +--------+--------+-----+---------+--------+ [id number]          triphasic         +--------+--------+-----+---------+--------+ Radial               triphasic         +--------+--------+-----+---------+--------+ Ulnar                triphasic         +--------+--------+-----+---------+--------+  Summary: Right Carotid: Velocities in the right ICA are consistent with a 1-39% stenosis. Left Carotid: Velocities in the left ICA are consistent with a 1-39% stenosis. Vertebrals: Bilateral vertebral arteries demonstrate antegrade flow. Right ABI: Resting right ankle-brachial index is within normal range. No evidence of significant right lower extremity arterial disease. Left ABI: Lt bka done 09/05/17. Right Upper Extremity: Doppler waveforms remain within normal limits with right  radial compression. Doppler waveforms remain within normal limits with right ulnar compression. Left Upper Extremity: Doppler waveforms remain within normal limits with left radial compression. Doppler waveforms remain within normal limits with left ulnar compression.     Preliminary     Cardiac Studies   Echo: 01/19/2019    1. Left ventricular ejection fraction, by visual estimation, is 55 to 60%. The left ventricle has normal function. Normal left ventricular size. There is no left ventricular hypertrophy.  2. Global right ventricle has normal systolic function.The right ventricular size is normal. No increase in right ventricular wall thickness.  3. Left atrial size was mildly dilated.  4. Right atrial size was normal.  5. The mitral valve is normal in structure. Mild mitral valve regurgitation. No evidence of mitral stenosis.  6. The tricuspid valve is normal in structure. Tricuspid valve regurgitation is trivial.  7. The aortic valve  is tricuspid Aortic valve regurgitation was not visualized by color flow Doppler. Mild aortic valve sclerosis without stenosis.  8. The pulmonic valve was normal in structure. Pulmonic valve regurgitation is not visualized by color flow Doppler.  9. Normal pulmonary artery systolic pressure. 10. The inferior vena cava is normal in size with greater than 50% respiratory variability, suggesting right atrial pressure of 3 mmHg.    Patient Profile     53 y.o. male with a history of hypertension, uncontrolled diabetes mellitus, hyperlipidemia, mild PAD, CKD stage III with proteinuria, CAD with silent ischemia s/p LHC with DES to DIAG in 2011 at [facility] here with chest pain concerning for myocardial infarction as initial EKG reviewed T wave inversion and minimal ST elevation at the inferior lateral leads however now chest pain-free with repeat EKG unremarkable.  Assessment & Plan    1. Precordial chest pain/ Aborted lateral MI:  - high-sensitivity troponin unremarkable at 10. - Status post full dose aspirin  - Echo is normal.        -- cardiac cath showed severe LAD disease extending from proximal to mid vessel. Very complex anatomy with calcification and marked tortuosity. Attempted PCI today by Dr Tamala Julian unsuccessful due to inability to cross lesion with wire. Recommend CT surgery consult for LIMA to LAD. He did receive Plavix load today so will need plavix wash out.  - another episode of typical chest pain yesterday, now asymptomatic, his ECG was unchanged and normal during the episode, very hypertensive, I will start Imdur 60 mg po daily and monitor closely - he is scheduled for a CABG on Monday, however his Crea is worsening and his CABG will be postponed - I have sent P2Y12 studies, still pending  2.  CKD stage III with proteinuria/nephrotic syndrome. :       -Appreciate nephrology recommendation       -- renal parameters worsening 2.89->3.15-> 4.35       -- diuresis,  nephrology started iv lasix to 80 mg BID.       -- will monitor renal indices closely.   3.  Hypertension: Initially hypertensive to 210s/100s however this has        improved.      -Continue metoprolol, amlodipine      -- hydralazine added per nephrology.  4.  Hyperlipidemia: LDL of 72.       -Continue Crestor 40 mg daily  5.  Uncontrolled diabetes mellitus: His last A1c was 11.2% on 12/17/2018.      -Continue sliding scale insulin     -Continue CBG monitoring  For  questions or updates, please contact [place] Please consult [email or web address] for contact info under     Signed, Ena Dawley, MD  01/23/2019, 10:43 AM

## 2019-01-23 NOTE — Progress Notes (Signed)
Subjective:  Urine output 1500 last 24 hours- which is less on lasix 80 q 12 IV-  BUN and crt increased again -- cath and attempted PCI Fri-    Now looking at CABG  Objective Vital signs in last 24 hours: Vitals:   01/22/19 1101 01/22/19 1700 01/22/19 1958 01/22/19 2351  BP: (!) 180/91 (!) 148/76 136/69 (!) 142/71  Pulse: 68 65 66 67  Resp: 19 12 13 13   Temp: 98.6 F (37 C) 98 F (36.7 C) 99 F (37.2 C) 98.5 F (36.9 C)  TempSrc: Oral Oral Oral Oral  SpO2: 98% 96% 97% 96%  Weight:      Height:       Weight change:   Intake/Output Summary (Last 24 hours) at 01/23/2019 7867 Last data filed at 01/23/2019 0400 Gross per 24 hour  Intake 1128 ml  Output 1500 ml  Net -372 ml    Assessment/ Plan: Pt is a 53 y.o. yo male baseline CKD and nephrotic syndrome who was admitted on 01/19/2019 with  ACS-   Assessment/Plan: 1. ACS-  S/p cath- severe LAD disease- attempted PCI- was unsuccessful- now planning on CABG on Monday afternoon.  If creatinine still trending in the wrong direction it might be best to put off CABG until renal function is on the mend 2. Renal-  Baseline crt of late in the mid 2's.  Nephrotic syndrome ongoing- think there is a good possibility that is due to DM.  Wanted to do kidney biopsy at some point to rule out a more treatable etiology- arranged as OP but put on hold due to events.    Kidney function trended better pre cath - worse again today after contrast admin on Friday.  Good UOP but decreased on moderate dose lasix - suspect will improve over the next few days but think it would be best to put CABG on hold until renal function improved 3. HTN/vol-  Blood pressure less variable last 24 hours- but high from 544 to 920 systolic - norvasc 10/OFHQRFXJOIT 50 TID/lopressor 50 BID -  Is pretty volume overloaded, resumed IV lasix to help- no change today in BP meds- cont lasix even though did not diurese yesterday 4. Anemia - not an issue as of yet requiring treatment but is  trending down some - check iron stores   Chase Clark    Labs: Basic Metabolic Panel: Recent Labs  Lab 01/21/19 0233 01/22/19 0223 01/23/19 0228  NA 136 136 136  K 4.0 4.2 4.2  CL 101 102 102  CO2 24 24 24   GLUCOSE 189* 208* 165*  BUN 28* 30* 36*  CREATININE 2.78* 3.15* 4.35*  CALCIUM 8.6* 8.6* 8.4*  PHOS  --  4.8* 5.1*   Liver Function Tests: Recent Labs  Lab 01/19/19 0923 01/20/19 0029 01/22/19 0223 01/23/19 0228  AST 21 18  --   --   ALT 16 14  --   --   ALKPHOS 64 60  --   --   BILITOT 1.2 1.0  --   --   PROT 6.0* 5.4*  --   --   ALBUMIN 2.8* 2.5* 2.5* 2.4*   No results for input(s): LIPASE, AMYLASE in the last 168 hours. No results for input(s): AMMONIA in the last 168 hours. CBC: Recent Labs  Lab 01/19/19 0833 01/20/19 0029 01/21/19 0233 01/22/19 0223 01/23/19 0228  WBC 8.4 6.9 7.0 7.3 7.6  HGB 11.6* 10.5* 10.7* 10.0* 9.2*  HCT 36.8* 31.2* 31.5* 30.2* 28.1*  MCV 93.2 89.9 88.5 88.3 88.9  PLT 197 161 160 160 155   Cardiac Enzymes: No results for input(s): CKTOTAL, CKMB, CKMBINDEX, TROPONINI in the last 168 hours. CBG: Recent Labs  Lab 01/22/19 0920 01/22/19 1104 01/22/19 1538 01/22/19 2115 01/23/19 0641  GLUCAP 227* 200* 199* 194* 169*    Iron Studies: No results for input(s): IRON, TIBC, TRANSFERRIN, FERRITIN in the last 72 hours. Studies/Results: Vas US Doppler Pre Cabg  Result Date: 01/22/2019 PREOPERATIVE VASCULAR EVALUATION  Indications:      Pre cabg. Risk Factors:     Hypertension, hyperlipidemia, Diabetes. Comparison Study: no prior Performing Technologist: Abram Sander RVS  Examination Guidelines: A complete evaluation includes B-mode imaging, spectral Doppler, color Doppler, and power Doppler as needed of all accessible portions of each vessel. Bilateral testing is considered an integral part of a complete examination. Limited examinations for reoccurring indications may be performed as noted.  Right Carotid Findings:  +----------+--------+--------+--------+------------+--------+           PSV cm/sEDV cm/sStenosisDescribe    Comments +----------+--------+--------+--------+------------+--------+ CCA Prox  94      16              heterogenous         +----------+--------+--------+--------+------------+--------+ CCA Distal91      21              heterogenous         +----------+--------+--------+--------+------------+--------+ ICA Prox  158     34      1-39%   heterogenous         +----------+--------+--------+--------+------------+--------+ ICA Distal113     38                                   +----------+--------+--------+--------+------------+--------+ ECA       157     14                                   +----------+--------+--------+--------+------------+--------+  +----------+--------+-------+--------+------------+           PSV cm/sEDV cmsDescribeArm Pressure +----------+--------+-------+--------+------------+ Subclavian87                     147          +----------+--------+-------+--------+------------+ +---------+--------+--+--------+--+---------+ VertebralPSV cm/s68EDV cm/s21Antegrade +---------+--------+--+--------+--+---------+ Left Carotid Findings: +----------+--------+--------+--------+------------+--------+           PSV cm/sEDV cm/sStenosisDescribe    Comments +----------+--------+--------+--------+------------+--------+ CCA Prox  127     23              heterogenous         +----------+--------+--------+--------+------------+--------+ CCA Distal70      18              heterogenous         +----------+--------+--------+--------+------------+--------+ ICA Prox  105     35      1-39%   heterogenous         +----------+--------+--------+--------+------------+--------+ ICA Distal58      19                                   +----------+--------+--------+--------+------------+--------+ ECA       362     33                                    +----------+--------+--------+--------+------------+--------+ +----------+--------+--------+--------+------------+  SubclavianPSV cm/sEDV cm/sDescribeArm Pressure +----------+--------+--------+--------+------------+           107                     163          +----------+--------+--------+--------+------------+ +---------+--------+--+--------+--+ VertebralPSV cm/s62EDV cm/s23 +---------+--------+--+--------+--+  ABI Findings: +--------+------------------+-----+---------+--------+ Right   Rt Pressure (mmHg)IndexWaveform Comment  +--------+------------------+-----+---------+--------+ [id number]                    triphasic         +--------+------------------+-----+---------+--------+ PTA     118               0.72 biphasic          +--------+------------------+-----+---------+--------+ DP      162               0.99 biphasic          +--------+------------------+-----+---------+--------+ +--------+------------------+-----+---------+-------+ Left    Lt Pressure (mmHg)IndexWaveform Comment +--------+------------------+-----+---------+-------+ [id number]                    triphasic        +--------+------------------+-----+---------+-------+ PTA                                     bka     +--------+------------------+-----+---------+-------+ DP                                      bka     +--------+------------------+-----+---------+-------+ +-------+---------------+----------------+ ABI/TBIToday's ABI/TBIPrevious ABI/TBI +-------+---------------+----------------+ Right  0.99                            +-------+---------------+----------------+  Right Doppler Findings: +--------+--------+-----+---------+--------+ Site    PressureIndexDoppler  Comments +--------+--------+-----+---------+--------+ [id number]          triphasic         +--------+--------+-----+---------+--------+ Radial                triphasic         +--------+--------+-----+---------+--------+ Ulnar                triphasic         +--------+--------+-----+---------+--------+  Left Doppler Findings: +--------+--------+-----+---------+--------+ Site    PressureIndexDoppler  Comments +--------+--------+-----+---------+--------+ [id number]          triphasic         +--------+--------+-----+---------+--------+ Radial               triphasic         +--------+--------+-----+---------+--------+ Ulnar                triphasic         +--------+--------+-----+---------+--------+  Summary: Right Carotid: Velocities in the right ICA are consistent with a 1-39% stenosis. Left Carotid: Velocities in the left ICA are consistent with a 1-39% stenosis. Vertebrals: Bilateral vertebral arteries demonstrate antegrade flow. Right ABI: Resting right ankle-brachial index is within normal range. No evidence of significant right lower extremity arterial disease. Left ABI: Lt bka done 09/05/17. Right Upper Extremity: Doppler waveforms remain within normal limits with right radial compression. Doppler waveforms remain within normal limits with right ulnar compression. Left Upper Extremity: Doppler waveforms remain within normal limits with left radial compression. Doppler waveforms remain within normal limits with left ulnar compression.  Preliminary    Medications: Infusions: . sodium chloride    . sodium chloride    . heparin 1,700 Units/hr (01/22/19 2046)    Scheduled Medications: . amLODipine  10 mg Oral Daily  . aspirin EC  81 mg Oral Daily  . furosemide  80 mg Intravenous Q12H  . gabapentin  300 mg Oral QPC breakfast  . hydrALAZINE  50 mg Oral TID  . insulin aspart  0-15 Units Subcutaneous TID WC  . isosorbide mononitrate  60 mg Oral Daily  . metoprolol tartrate  50 mg Oral BID  . pantoprazole  40 mg Oral Daily  . rosuvastatin  40 mg Oral q1800  . sodium chloride flush  3 mL Intravenous Once  . sodium  chloride flush  3 mL Intravenous Q12H    have reviewed scheduled and prn medications.  Physical Exam: General: pleasant, NAD Heart: RRR Lungs: dec BS at bases Abdomen: distended Extremities: pitting edema to dep areas    01/23/2019,7:13 AM  LOS: 4 days

## 2019-01-23 NOTE — Progress Notes (Signed)
ANTICOAGULATION CONSULT NOTE - Follow-Up Consult  Pharmacy Consult for Heparin  Indication: chest pain/ACS  No Known Allergies  Patient Measurements: Height: 5\' 10"  (177.8 cm) Weight: 239 lb 6.7 oz (108.6 kg) IBW/kg (Calculated) : 73 Heparin Dosing Weight: 97.6 kg  Vital Signs: Temp: 98.1 F (36.7 C) (10/11 0750) Temp Source: Oral (10/11 0750) BP: 135/80 (10/11 0750) Pulse Rate: 67 (10/11 0750)  Labs: Recent Labs    01/21/19 0233 01/21/19 2050 01/22/19 0223 01/23/19 0228  HGB 10.7*  --  10.0* 9.2*  HCT 31.5*  --  30.2* 28.1*  PLT 160  --  160 155  HEPARINUNFRC  --  0.32 0.31 0.35  CREATININE 2.78*  --  3.15* 4.35*    Estimated Creatinine Clearance: 23.7 mL/min (A) (by C-G formula based on SCr of 4.35 mg/dL (H)).   Medical History: Past Medical History:  Diagnosis Date  . Arthritis   . Asthma    as child  . Coronary artery disease    DES DIAG1 11/20/09 [facility])  . Diabetes mellitus without complication ([place])   . Diabetic osteomyelitis ([place])   . Diabetic ulcer of foot with muscle involvement without evidence of necrosis ([place])    DIABETIC ULCERATIONS ASSOCIATED WITH IRRITATION LATERAL ANKLE LEFT GREATER THAN RIGHT WITH MILD CELLULITIS   . Diverticulosis   . DKA (diabetic ketoacidoses) ([place]) 08/23/2017  . Edema, lower extremity   . Fatty liver   . GERD (gastroesophageal reflux disease)   . Gilbert's disease   . Glaucoma   . Heart disease 2011   Patient has stent for 80% blockage  . Hyperlipidemia   . Hypertension   . Neuropathy   . Seasonal allergies   . Shoulder pain   . Ulcer of foot ([place]) 08.06.2014   DIABETIC ULCERATIONS ASSOCIATED WITH IRRITATION LATERAL ANKLE LEFT GREATER THAN RIGHT WITH MILD CELLULITIS    Assessment: 60 YOM with known CAD (stent 2011), CKD, DM, HTN, HLD presented to [facility] ED with swelling, SOB, and chest pressure with pain to Left arm. Pt started on IV heparin. Cardiac cath showed severe LAD disease extending from proximal to mid vessel.  Very complex anatomy with calcification and marked tortuosity. Attempted PCI today by Dr Tamala Julian unsuccessful due to inability to cross lesion with wire. Recommend CT surgery consult for LIMA to LAD. He did receive Plavix load today so will need plavix wash out. Possible plans for CABG on 10/12.  Heparin level continues to be at goal this morning.  CBC continues to trend down with hgb of 9.2; no active bleeding noted.  Plavix loaded 10/9  Goal of Therapy:  Heparin level 0.3-0.7 units/ml Monitor platelets by anticoagulation protocol: Yes   Plan:  -Continue heparin at 1700 units/hr -Monitor daily heparin level and CBC -Watch for s/sx of bleeding -Tentative plan for CABG on Monday  Erin Hearing PharmD., BCPS Clinical Pharmacist 01/23/2019 9:01 AM

## 2019-01-23 NOTE — Progress Notes (Signed)
Pt's condition is stable, vitals stable, denies chest pain, nausea, vomiting, ambulating in a room, wife is in bed side and updated about cancellation of CABG tomorrow until his Creatinine becomes stable, will continue to monitor the patient  Palma Holter, RN

## 2019-01-24 ENCOUNTER — Encounter (HOSPITAL_COMMUNITY)
Admission: EM | Disposition: A | Discharge status: Home / Self Care | Payer: Self-pay | Source: Home / Self Care | Attending: Cardiology

## 2019-01-24 DIAGNOSIS — I371 Nonrheumatic pulmonary valve insufficiency: Secondary | ICD-10-CM | POA: Diagnosis not present

## 2019-01-24 DIAGNOSIS — N183 Chronic kidney disease, stage 3 unspecified: Secondary | ICD-10-CM | POA: Diagnosis not present

## 2019-01-24 DIAGNOSIS — E8770 Fluid overload, unspecified: Secondary | ICD-10-CM | POA: Diagnosis not present

## 2019-01-24 DIAGNOSIS — N049 Nephrotic syndrome with unspecified morphologic changes: Secondary | ICD-10-CM | POA: Diagnosis not present

## 2019-01-24 DIAGNOSIS — I2129 ST elevation (STEMI) myocardial infarction involving other sites: Secondary | ICD-10-CM | POA: Diagnosis not present

## 2019-01-24 DIAGNOSIS — R072 Precordial pain: Secondary | ICD-10-CM | POA: Diagnosis not present

## 2019-01-24 DIAGNOSIS — E785 Hyperlipidemia, unspecified: Secondary | ICD-10-CM | POA: Diagnosis not present

## 2019-01-24 DIAGNOSIS — I200 Unstable angina: Secondary | ICD-10-CM | POA: Diagnosis not present

## 2019-01-24 DIAGNOSIS — I10 Essential (primary) hypertension: Secondary | ICD-10-CM | POA: Diagnosis not present

## 2019-01-24 DIAGNOSIS — I25118 Atherosclerotic heart disease of native coronary artery with other forms of angina pectoris: Secondary | ICD-10-CM | POA: Diagnosis not present

## 2019-01-24 DIAGNOSIS — I25110 Atherosclerotic heart disease of native coronary artery with unstable angina pectoris: Secondary | ICD-10-CM | POA: Diagnosis not present

## 2019-01-24 DIAGNOSIS — I2510 Atherosclerotic heart disease of native coronary artery without angina pectoris: Secondary | ICD-10-CM | POA: Diagnosis not present

## 2019-01-24 DIAGNOSIS — I129 Hypertensive chronic kidney disease with stage 1 through stage 4 chronic kidney disease, or unspecified chronic kidney disease: Secondary | ICD-10-CM | POA: Diagnosis not present

## 2019-01-24 DIAGNOSIS — J9811 Atelectasis: Secondary | ICD-10-CM | POA: Diagnosis not present

## 2019-01-24 DIAGNOSIS — I081 Rheumatic disorders of both mitral and tricuspid valves: Secondary | ICD-10-CM | POA: Diagnosis not present

## 2019-01-24 DIAGNOSIS — R079 Chest pain, unspecified: Secondary | ICD-10-CM | POA: Diagnosis not present

## 2019-01-24 DIAGNOSIS — Z4682 Encounter for fitting and adjustment of non-vascular catheter: Secondary | ICD-10-CM | POA: Diagnosis not present

## 2019-01-24 DIAGNOSIS — Z20828 Contact with and (suspected) exposure to other viral communicable diseases: Secondary | ICD-10-CM | POA: Diagnosis not present

## 2019-01-24 DIAGNOSIS — Z951 Presence of aortocoronary bypass graft: Secondary | ICD-10-CM | POA: Diagnosis not present

## 2019-01-24 DIAGNOSIS — N179 Acute kidney failure, unspecified: Secondary | ICD-10-CM | POA: Diagnosis not present

## 2019-01-24 LAB — RENAL FUNCTION PANEL
Albumin: 2.7 g/dL — ABNORMAL LOW (ref 3.5–5.0)
Anion gap: 10 (ref 5–15)
BUN: 41 mg/dL — ABNORMAL HIGH (ref 6–20)
CO2: 24 mmol/L (ref 22–32)
Calcium: 8.9 mg/dL (ref 8.9–10.3)
Chloride: 101 mmol/L (ref 98–111)
Creatinine, Ser: 5.42 mg/dL — ABNORMAL HIGH (ref 0.61–1.24)
GFR calc Af Amer: 13 mL/min — ABNORMAL LOW (ref >60)
GFR calc non Af Amer: 11 mL/min — ABNORMAL LOW (ref >60)
Glucose, Bld: 244 mg/dL — ABNORMAL HIGH (ref 70–99)
Phosphorus: 5.3 mg/dL — ABNORMAL HIGH (ref 2.5–4.6)
Potassium: 4.2 mmol/L (ref 3.5–5.1)
Sodium: 135 mmol/L (ref 135–145)

## 2019-01-24 LAB — CBC
HCT: 29.3 % — ABNORMAL LOW (ref 39.0–52.0)
Hemoglobin: 9.6 g/dL — ABNORMAL LOW (ref 13.0–17.0)
MCH: 29.1 pg (ref 26.0–34.0)
MCHC: 32.8 g/dL (ref 30.0–36.0)
MCV: 88.8 fL (ref 80.0–100.0)
Platelets: 166 10*3/uL (ref 150–400)
RBC: 3.3 MIL/uL — ABNORMAL LOW (ref 4.22–5.81)
RDW: 13.5 % (ref 11.5–15.5)
WBC: 7.2 10*3/uL (ref 4.0–10.5)
nRBC: 0 % (ref 0.0–0.2)

## 2019-01-24 LAB — GLUCOSE, CAPILLARY
Glucose-Capillary: 309 mg/dL — ABNORMAL HIGH (ref 70–99)
Glucose-Capillary: 164 mg/dL — ABNORMAL HIGH (ref 70–99)
Glucose-Capillary: 119 mg/dL — ABNORMAL HIGH (ref 70–99)
Glucose-Capillary: 99 mg/dL (ref 70–99)

## 2019-01-24 LAB — FERRITIN: Ferritin: 46 ng/mL (ref 24–336)

## 2019-01-24 LAB — IRON AND TIBC
Iron: 70 ug/dL (ref 45–182)
Saturation Ratios: 21 % (ref 17.9–39.5)
TIBC: 328 ug/dL (ref 250–450)
UIBC: 258 ug/dL

## 2019-01-24 LAB — HEPARIN LEVEL (UNFRACTIONATED): Heparin Unfractionated: 0.36 IU/mL (ref 0.30–0.70)

## 2019-01-24 SURGERY — OFF PUMP CORONARY ARTERY BYPASS GRAFTING (CABG)
Anesthesia: General | Site: Chest

## 2019-01-24 MED ORDER — DOCUSATE SODIUM 100 MG PO CAPS
100.0000 mg | ORAL_CAPSULE | Freq: Every day | ORAL | Status: DC
  Administered 2019-01-24 – 2019-01-26 (×3): 100 mg via ORAL
  Filled 2019-01-24 (×3): qty 1

## 2019-01-24 MED ORDER — FUROSEMIDE 10 MG/ML IJ SOLN
80.0000 mg | Freq: Every day | INTRAMUSCULAR | Status: DC
  Administered 2019-01-25: 10:00:00 80 mg via INTRAVENOUS
  Filled 2019-01-24: qty 8

## 2019-01-24 MED ORDER — ATORVASTATIN CALCIUM 80 MG PO TABS
80.0000 mg | ORAL_TABLET | Freq: Every day | ORAL | Status: DC
  Administered 2019-01-24 – 2019-01-26 (×3): 80 mg via ORAL
  Filled 2019-01-24 (×3): qty 1

## 2019-01-24 MED ORDER — INSULIN GLARGINE 100 UNIT/ML ~~LOC~~ SOLN
20.0000 [IU] | Freq: Every day | SUBCUTANEOUS | Status: DC
  Administered 2019-01-24 – 2019-01-26 (×3): 20 [IU] via SUBCUTANEOUS
  Filled 2019-01-24 (×4): qty 0.2

## 2019-01-24 MED ORDER — POLYETHYLENE GLYCOL 3350 17 G PO PACK
17.0000 g | PACK | Freq: Every day | ORAL | Status: DC
  Administered 2019-01-24 – 2019-01-26 (×3): 17 g via ORAL
  Filled 2019-01-24 (×3): qty 1

## 2019-01-24 NOTE — Progress Notes (Signed)
ANTICOAGULATION CONSULT NOTE - Follow-Up Consult  Pharmacy Consult for Heparin  Indication: chest pain/ACS  No Known Allergies  Patient Measurements: Height: 5\' 10"  (177.8 cm) Weight: 239 lb 6.7 oz (108.6 kg) IBW/kg (Calculated) : 73 Heparin Dosing Weight: 97.6 kg  Vital Signs: Temp: 98.7 F (37.1 C) (10/12 0447) Temp Source: Oral (10/12 0447) BP: 147/83 (10/12 0447) Pulse Rate: 73 (10/12 0447)  Labs: Recent Labs    01/22/19 0223 01/23/19 0228 01/24/19 0323  HGB 10.0* 9.2* 9.6*  HCT 30.2* 28.1* 29.3*  PLT 160 155 166  HEPARINUNFRC 0.31 0.35 0.36  CREATININE 3.15* 4.35* 5.42*    Estimated Creatinine Clearance: 19 mL/min (A) (by C-G formula based on SCr of 5.42 mg/dL (H)).   Medical History: Past Medical History:  Diagnosis Date  . Arthritis   . Asthma    as child  . Coronary artery disease    DES DIAG1 11/20/09 [facility])  . Diabetes mellitus without complication ([place])   . Diabetic osteomyelitis ([place])   . Diabetic ulcer of foot with muscle involvement without evidence of necrosis ([place])    DIABETIC ULCERATIONS ASSOCIATED WITH IRRITATION LATERAL ANKLE LEFT GREATER THAN RIGHT WITH MILD CELLULITIS   . Diverticulosis   . DKA (diabetic ketoacidoses) ([place]) 08/23/2017  . Edema, lower extremity   . Fatty liver   . GERD (gastroesophageal reflux disease)   . Gilbert's disease   . Glaucoma   . Heart disease 2011   Patient has stent for 80% blockage  . Hyperlipidemia   . Hypertension   . Neuropathy   . Seasonal allergies   . Shoulder pain   . Ulcer of foot ([place]) 08.06.2014   DIABETIC ULCERATIONS ASSOCIATED WITH IRRITATION LATERAL ANKLE LEFT GREATER THAN RIGHT WITH MILD CELLULITIS    Assessment: 15 YOM with known CAD (stent 2011), CKD, DM, HTN, HLD presented to [facility] ED with swelling, SOB, and chest pressure with pain to Left arm. Pt started on IV heparin. Cardiac cath showed severe LAD disease extending from proximal to mid vessel (unable to cross). Plans for CABG when  renal function improved -heparin level at goal  Goal of Therapy:  Heparin level 0.3-0.7 units/ml Monitor platelets by anticoagulation protocol: Yes   Plan:  -Continue heparin at 1700 units/hr -Monitor daily heparin level and CBC  Hildred Laser, PharmD Clinical Pharmacist **Pharmacist phone directory can now be found on [email or web address] ([email or web address]).  Listed under [place].

## 2019-01-24 NOTE — Progress Notes (Signed)
Progress Note  Patient Name: Chase Clark Date of Encounter: 01/24/2019  Primary Cardiologist: No primary care provider on file. Dr. Ubaldo Glassing  Subjective   No chest pain.  Feels that fluid retention issues are improving.  Still feels better sitting up, to breath.  Inpatient Medications    Scheduled Meds: . amLODipine  10 mg Oral Daily  . aspirin EC  81 mg Oral Daily  . atorvastatin  80 mg Oral q1800  . docusate sodium  100 mg Oral Daily  . [START ON 01/25/2019] furosemide  80 mg Intravenous Daily  . gabapentin  300 mg Oral QPC breakfast  . hydrALAZINE  50 mg Oral TID  . insulin aspart  0-15 Units Subcutaneous TID WC  . insulin glargine  20 Units Subcutaneous Daily  . isosorbide mononitrate  60 mg Oral Daily  . metoprolol tartrate  50 mg Oral BID  . pantoprazole  40 mg Oral Daily  . polyethylene glycol  17 g Oral Daily  . sodium chloride flush  3 mL Intravenous Once  . sodium chloride flush  3 mL Intravenous Q12H   Continuous Infusions: . sodium chloride    . sodium chloride    . heparin 1,700 Units/hr (01/24/19 0452)   PRN Meds: sodium chloride, sodium chloride, acetaminophen, alum & mag hydroxide-simeth, calcium carbonate, morphine injection, ondansetron (ZOFRAN) IV, sodium chloride, sodium chloride flush   Vital Signs    Vitals:   01/23/19 1552 01/23/19 2224 01/24/19 0447 01/24/19 0918  BP: 126/66 130/68 (!) 147/83 (!) 167/93  Pulse: 63 88 73 68  Resp: 16 17 13    Temp: 98.1 F (36.7 C) 98.1 F (36.7 C) 98.7 F (37.1 C) 98.9 F (37.2 C)  TempSrc: Oral Oral Oral Oral  SpO2: 97% 96% 97% 100%  Weight:      Height:        Intake/Output Summary (Last 24 hours) at 01/24/2019 1055 Last data filed at 01/24/2019 0400 Gross per 24 hour  Intake 978 ml  Output 1225 ml  Net -247 ml   Last 3 Weights 01/21/2019 01/20/2019 01/19/2019  Weight (lbs) 239 lb 6.7 oz 238 lb 12.1 oz 241 lb 6.5 oz  Weight (kg) 108.6 kg 108.3 kg 109.5 kg      Telemetry    NSR -  Personally Reviewed  ECG    Physical Exam   GEN: No acute distress.   Neck: No JVD Cardiac: RRR, no murmurs, rubs, or gallops.  Respiratory: Clear to auscultation bilaterally. GI: Soft, nontender, non-distended  MS: Pitting LE edema; No deformity. Neuro:  Nonfocal  Psych: Normal affect   Labs    High Sensitivity Troponin:   Recent Labs  Lab 01/19/19 0833 01/19/19 0923  TROPONINIHS 10 10      Chemistry Recent Labs  Lab 01/19/19 0923 01/20/19 0029  01/22/19 0223 01/23/19 0228 01/24/19 0323  NA 138 137   < > 136 136 135  K 4.4 4.0   < > 4.2 4.2 4.2  CL 105 104   < > 102 102 101  CO2 25 25   < > 24 24 24   GLUCOSE 97 198*   < > 208* 165* 244*  BUN 28* 30*   < > 30* 36* 41*  CREATININE 2.89* 3.09*   < > 3.15* 4.35* 5.42*  CALCIUM 8.5* 8.3*   < > 8.6* 8.4* 8.9  PROT 6.0* 5.4*  --   --   --   --   ALBUMIN 2.8* 2.5*  --  2.5* 2.4* 2.7*  AST 21 18  --   --   --   --   ALT 16 14  --   --   --   --   ALKPHOS 64 60  --   --   --   --   BILITOT 1.2 1.0  --   --   --   --   GFRNONAA 23* 22*   < > 21* 14* 11*  GFRAA 27* 25*   < > 24* 17* 13*  ANIONGAP 8 8   < > 10 10 10    < > = values in this interval not displayed.     Hematology Recent Labs  Lab 01/22/19 0223 01/23/19 0228 01/24/19 0323  WBC 7.3 7.6 7.2  RBC 3.42* 3.16* 3.30*  HGB 10.0* 9.2* 9.6*  HCT 30.2* 28.1* 29.3*  MCV 88.3 88.9 88.8  MCH 29.2 29.1 29.1  MCHC 33.1 32.7 32.8  RDW 13.3 13.5 13.5  PLT 160 155 166    BNPNo results for input(s): BNP, PROBNP in the last 168 hours.   DDimer No results for input(s): DDIMER in the last 168 hours.   Radiology    Vas US Doppler Pre Cabg  Result Date: 01/23/2019 PREOPERATIVE VASCULAR EVALUATION  Indications:      Pre cabg. Risk Factors:     Hypertension, hyperlipidemia, Diabetes. Comparison Study: no prior Performing Technologist: Abram Sander RVS  Examination Guidelines: A complete evaluation includes B-mode imaging, spectral Doppler, color Doppler, and  power Doppler as needed of all accessible portions of each vessel. Bilateral testing is considered an integral part of a complete examination. Limited examinations for reoccurring indications may be performed as noted.  Right Carotid Findings: +----------+--------+--------+--------+------------+--------+           PSV cm/sEDV cm/sStenosisDescribe    Comments +----------+--------+--------+--------+------------+--------+ CCA Prox  94      16              heterogenous         +----------+--------+--------+--------+------------+--------+ CCA Distal91      21              heterogenous         +----------+--------+--------+--------+------------+--------+ ICA Prox  158     34      1-39%   heterogenous         +----------+--------+--------+--------+------------+--------+ ICA Distal113     38                                   +----------+--------+--------+--------+------------+--------+ ECA       157     14                                   +----------+--------+--------+--------+------------+--------+ Portions of this table do not appear on this page. +----------+--------+-------+--------+------------+           PSV cm/sEDV cmsDescribeArm Pressure +----------+--------+-------+--------+------------+ Subclavian87                     147          +----------+--------+-------+--------+------------+ +---------+--------+--+--------+--+---------+ VertebralPSV cm/s68EDV cm/s21Antegrade +---------+--------+--+--------+--+---------+ Left Carotid Findings: +----------+--------+--------+--------+------------+--------+           PSV cm/sEDV cm/sStenosisDescribe    Comments +----------+--------+--------+--------+------------+--------+ CCA Prox  127     23  heterogenous         +----------+--------+--------+--------+------------+--------+ CCA Distal70      18              heterogenous          +----------+--------+--------+--------+------------+--------+ ICA Prox  105     35      1-39%   heterogenous         +----------+--------+--------+--------+------------+--------+ ICA Distal58      19                                   +----------+--------+--------+--------+------------+--------+ ECA       362     33                                   +----------+--------+--------+--------+------------+--------+ +----------+--------+--------+--------+------------+ SubclavianPSV cm/sEDV cm/sDescribeArm Pressure +----------+--------+--------+--------+------------+           107                     163          +----------+--------+--------+--------+------------+ +---------+--------+--+--------+--+ VertebralPSV cm/s62EDV cm/s23 +---------+--------+--+--------+--+  ABI Findings: +--------+------------------+-----+---------+--------+ Right   Rt Pressure (mmHg)IndexWaveform Comment  +--------+------------------+-----+---------+--------+ [id number]                    triphasic         +--------+------------------+-----+---------+--------+ PTA     118               0.72 biphasic          +--------+------------------+-----+---------+--------+ DP      162               0.99 biphasic          +--------+------------------+-----+---------+--------+ +--------+------------------+-----+---------+-------+ Left    Lt Pressure (mmHg)IndexWaveform Comment +--------+------------------+-----+---------+-------+ [id number]                    triphasic        +--------+------------------+-----+---------+-------+ PTA                                     bka     +--------+------------------+-----+---------+-------+ DP                                      bka     +--------+------------------+-----+---------+-------+ +-------+---------------+----------------+ ABI/TBIToday's ABI/TBIPrevious ABI/TBI +-------+---------------+----------------+ Right  0.99                             +-------+---------------+----------------+  Right Doppler Findings: +--------+--------+-----+---------+--------+ Site    PressureIndexDoppler  Comments +--------+--------+-----+---------+--------+ [id number]          triphasic         +--------+--------+-----+---------+--------+ Radial               triphasic         +--------+--------+-----+---------+--------+ Ulnar                triphasic         +--------+--------+-----+---------+--------+  Left Doppler Findings: +--------+--------+-----+---------+--------+ Site    PressureIndexDoppler  Comments +--------+--------+-----+---------+--------+ [id number]          triphasic         +--------+--------+-----+---------+--------+  Radial               triphasic         +--------+--------+-----+---------+--------+ Ulnar                triphasic         +--------+--------+-----+---------+--------+  Summary: Right Carotid: Velocities in the right ICA are consistent with a 1-39% stenosis. Left Carotid: Velocities in the left ICA are consistent with a 1-39% stenosis. Vertebrals: Bilateral vertebral arteries demonstrate antegrade flow. Right ABI: Resting right ankle-brachial index is within normal range. No evidence of significant right lower extremity arterial disease. Left ABI: Lt bka done 09/05/17. Right Upper Extremity: Doppler waveforms remain within normal limits with right radial compression. Doppler waveforms remain within normal limits with right ulnar compression. Left Upper Extremity: Doppler waveforms remain within normal limits with left radial compression. Doppler waveforms remain within normal limits with left ulnar compression.  Electronically signed by Ruta Hinds MD on 01/23/2019 at 12:36:56 PM.    Final     Cardiac Studies   Cath results reviewed  Patient Profile     53 y.o. male with CAD.  Awaiting surgery when renal function improves.   Assessment & Plan    1) Cr  increased today.  Still appears volume overloaded.  Receiving Lasix.  Will reassess Lasix for AM based on fluid status and Cr.  CAD: Awaiting CABG. COntinue high dose statin. NO ACE-I.  For questions or updates, please contact [place] Please consult [email or web address] for contact info under        Signed, Larae Grooms, MD  01/24/2019, 10:55 AM

## 2019-01-24 NOTE — Progress Notes (Signed)
Patient ID: Chase Clark, male   DOB: 09-08-1965, 53 y.o.   MRN: [id number] S: "I am constipated and feel dehydrated".   O:BP (!) 167/93 (BP Location: Left Arm)   Pulse 68   Temp 98.9 F (37.2 C) (Oral)   Resp 13   Ht 5\' 10"  (1.778 m)   Wt 108.6 kg   SpO2 100%   BMI 34.35 kg/m   Intake/Output Summary (Last 24 hours) at 01/24/2019 1020 Last data filed at 01/24/2019 0400 Gross per 24 hour  Intake 978 ml  Output 1225 ml  Net -247 ml   Intake/Output: I/O last 3 completed shifts: In: 73 [P.O.:810; I.V.:816] Out: 1825 [Urine:1825]  Intake/Output this shift:  No intake/output data recorded. Weight change:  Gen: NAD CVS: no rub Resp: cta Abd: distended, +BS, soft, NT Ext: 1+ edema RLE, s/p LBKA  Recent Labs  Lab 01/19/19 0833 01/19/19 0923 01/20/19 0029 01/21/19 0233 01/22/19 0223 01/23/19 0228 01/24/19 0323  NA 139 138 137 136 136 136 135  K 4.5 4.4 4.0 4.0 4.2 4.2 4.2  CL 106 105 104 101 102 102 101  CO2 23 25 25 24 24 24 24   GLUCOSE 102* 97 198* 189* 208* 165* 244*  BUN 28* 28* 30* 28* 30* 36* 41*  CREATININE 2.84* 2.89* 3.09* 2.78* 3.15* 4.35* 5.42*  ALBUMIN  --  2.8* 2.5*  --  2.5* 2.4* 2.7*  CALCIUM 9.0 8.5* 8.3* 8.6* 8.6* 8.4* 8.9  PHOS  --   --   --   --  4.8* 5.1* 5.3*  AST  --  21 18  --   --   --   --   ALT  --  16 14  --   --   --   --    Liver Function Tests: Recent Labs  Lab 01/19/19 0923 01/20/19 0029 01/22/19 0223 01/23/19 0228 01/24/19 0323  AST 21 18  --   --   --   ALT 16 14  --   --   --   ALKPHOS 64 60  --   --   --   BILITOT 1.2 1.0  --   --   --   PROT 6.0* 5.4*  --   --   --   ALBUMIN 2.8* 2.5* 2.5* 2.4* 2.7*   No results for input(s): LIPASE, AMYLASE in the last 168 hours. No results for input(s): AMMONIA in the last 168 hours. CBC: Recent Labs  Lab 01/20/19 0029 01/21/19 0233 01/22/19 0223 01/23/19 0228 01/24/19 0323  WBC 6.9 7.0 7.3 7.6 7.2  HGB 10.5* 10.7* 10.0* 9.2* 9.6*  HCT 31.2* 31.5* 30.2* 28.1* 29.3*  MCV  89.9 88.5 88.3 88.9 88.8  PLT 161 160 160 155 166   Cardiac Enzymes: No results for input(s): CKTOTAL, CKMB, CKMBINDEX, TROPONINI in the last 168 hours. CBG: Recent Labs  Lab 01/23/19 0641 01/23/19 1102 01/23/19 1557 01/23/19 2235 01/24/19 0656  GLUCAP 169* 167* 146* 185* 309*    Iron Studies:  Recent Labs    01/24/19 0323  IRON 70  TIBC 328  FERRITIN 46   Studies/Results: Vas US Doppler Pre Cabg  Result Date: 01/23/2019 PREOPERATIVE VASCULAR EVALUATION  Indications:      Pre cabg. Risk Factors:     Hypertension, hyperlipidemia, Diabetes. Comparison Study: no prior Performing Technologist: Abram Sander RVS  Examination Guidelines: A complete evaluation includes B-mode imaging, spectral Doppler, color Doppler, and power Doppler as needed of all accessible portions of each vessel. Bilateral testing  is considered an integral part of a complete examination. Limited examinations for reoccurring indications may be performed as noted.  Right Carotid Findings: +----------+--------+--------+--------+------------+--------+           PSV cm/sEDV cm/sStenosisDescribe    Comments +----------+--------+--------+--------+------------+--------+ CCA Prox  94      16              heterogenous         +----------+--------+--------+--------+------------+--------+ CCA Distal91      21              heterogenous         +----------+--------+--------+--------+------------+--------+ ICA Prox  158     34      1-39%   heterogenous         +----------+--------+--------+--------+------------+--------+ ICA Distal113     38                                   +----------+--------+--------+--------+------------+--------+ ECA       157     14                                   +----------+--------+--------+--------+------------+--------+ Portions of this table do not appear on this page. +----------+--------+-------+--------+------------+           PSV cm/sEDV cmsDescribeArm  Pressure +----------+--------+-------+--------+------------+ Subclavian87                     147          +----------+--------+-------+--------+------------+ +---------+--------+--+--------+--+---------+ VertebralPSV cm/s68EDV cm/s21Antegrade +---------+--------+--+--------+--+---------+ Left Carotid Findings: +----------+--------+--------+--------+------------+--------+           PSV cm/sEDV cm/sStenosisDescribe    Comments +----------+--------+--------+--------+------------+--------+ CCA Prox  127     23              heterogenous         +----------+--------+--------+--------+------------+--------+ CCA Distal70      18              heterogenous         +----------+--------+--------+--------+------------+--------+ ICA Prox  105     35      1-39%   heterogenous         +----------+--------+--------+--------+------------+--------+ ICA Distal58      19                                   +----------+--------+--------+--------+------------+--------+ ECA       362     33                                   +----------+--------+--------+--------+------------+--------+ +----------+--------+--------+--------+------------+ SubclavianPSV cm/sEDV cm/sDescribeArm Pressure +----------+--------+--------+--------+------------+           107                     163          +----------+--------+--------+--------+------------+ +---------+--------+--+--------+--+ VertebralPSV cm/s62EDV cm/s23 +---------+--------+--+--------+--+  ABI Findings: +--------+------------------+-----+---------+--------+ Right   Rt Pressure (mmHg)IndexWaveform Comment  +--------+------------------+-----+---------+--------+ [id number]                    triphasic         +--------+------------------+-----+---------+--------+ PTA     118  0.72 biphasic          +--------+------------------+-----+---------+--------+ DP      162               0.99 biphasic           +--------+------------------+-----+---------+--------+ +--------+------------------+-----+---------+-------+ Left    Lt Pressure (mmHg)IndexWaveform Comment +--------+------------------+-----+---------+-------+ [id number]                    triphasic        +--------+------------------+-----+---------+-------+ PTA                                     bka     +--------+------------------+-----+---------+-------+ DP                                      bka     +--------+------------------+-----+---------+-------+ +-------+---------------+----------------+ ABI/TBIToday's ABI/TBIPrevious ABI/TBI +-------+---------------+----------------+ Right  0.99                            +-------+---------------+----------------+  Right Doppler Findings: +--------+--------+-----+---------+--------+ Site    PressureIndexDoppler  Comments +--------+--------+-----+---------+--------+ [id number]          triphasic         +--------+--------+-----+---------+--------+ Radial               triphasic         +--------+--------+-----+---------+--------+ Ulnar                triphasic         +--------+--------+-----+---------+--------+  Left Doppler Findings: +--------+--------+-----+---------+--------+ Site    PressureIndexDoppler  Comments +--------+--------+-----+---------+--------+ [id number]          triphasic         +--------+--------+-----+---------+--------+ Radial               triphasic         +--------+--------+-----+---------+--------+ Ulnar                triphasic         +--------+--------+-----+---------+--------+  Summary: Right Carotid: Velocities in the right ICA are consistent with a 1-39% stenosis. Left Carotid: Velocities in the left ICA are consistent with a 1-39% stenosis. Vertebrals: Bilateral vertebral arteries demonstrate antegrade flow. Right ABI: Resting right ankle-brachial index is within normal range. No evidence of  significant right lower extremity arterial disease. Left ABI: Lt bka done 09/05/17. Right Upper Extremity: Doppler waveforms remain within normal limits with right radial compression. Doppler waveforms remain within normal limits with right ulnar compression. Left Upper Extremity: Doppler waveforms remain within normal limits with left radial compression. Doppler waveforms remain within normal limits with left ulnar compression.  Electronically signed by Ruta Hinds MD on 01/23/2019 at 12:36:56 PM.    Final    . amLODipine  10 mg Oral Daily  . aspirin EC  81 mg Oral Daily  . atorvastatin  80 mg Oral q1800  . docusate sodium  100 mg Oral Daily  . [START ON 01/25/2019] furosemide  80 mg Intravenous Daily  . gabapentin  300 mg Oral QPC breakfast  . hydrALAZINE  50 mg Oral TID  . insulin aspart  0-15 Units Subcutaneous TID WC  . insulin glargine  20 Units Subcutaneous Daily  . isosorbide mononitrate  60 mg Oral Daily  . metoprolol tartrate  50 mg Oral BID  .  pantoprazole  40 mg Oral Daily  . polyethylene glycol  17 g Oral Daily  . sodium chloride flush  3 mL Intravenous Once  . sodium chloride flush  3 mL Intravenous Q12H    BMET    Component Value Date/Time   NA 135 01/24/2019 0323   K 4.2 01/24/2019 0323   K 4.5 12/29/2013 1521   CL 101 01/24/2019 0323   CO2 24 01/24/2019 0323   GLUCOSE 244 (H) 01/24/2019 0323   BUN 41 (H) 01/24/2019 0323   CREATININE 5.42 (H) 01/24/2019 0323   CREATININE 2.42 (H) 12/31/2018 1454   CALCIUM 8.9 01/24/2019 0323   GFRNONAA 11 (L) 01/24/2019 0323   GFRAA 13 (L) 01/24/2019 0323   CBC    Component Value Date/Time   WBC 7.2 01/24/2019 0323   RBC 3.30 (L) 01/24/2019 0323   HGB 9.6 (L) 01/24/2019 0323   HCT 29.3 (L) 01/24/2019 0323   HCT 24.9 (L) 09/04/2017 2358   PLT 166 01/24/2019 0323   MCV 88.8 01/24/2019 0323   MCH 29.1 01/24/2019 0323   MCHC 32.8 01/24/2019 0323   RDW 13.5 01/24/2019 0323   LYMPHSABS 1.7 09/15/2017 0532   MONOABS 0.6  09/15/2017 0532   EOSABS 0.5 09/15/2017 0532   BASOSABS 0.1 09/15/2017 0532     Assessment/Plan:  1. 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  No indication for dialysis at this time and will continue to follow along closely 2. ACS- s/p cath with severe LAD and failed attempt for PCI now for CABG when renal function stabilizes/improves.  Currently without chest pain. 3. HTN/volume- variable but stable.   4. CHF- good UOP and has diuresed over 5 liters since admission, however BUN/Cr still climbing so will decrease lasix to 80mg  daily and follow.  5. Anemia of CKD with some blood loss. Transfuse as needed. 6. Constipation- will start colace and miralax and follow.  Donetta Potts, MD [employer] [phone]

## 2019-01-24 NOTE — Progress Notes (Signed)
     [address]       [place] [address]             [phone]       No events.  No pain last night  Vitals:   01/23/19 2224 01/24/19 0447  BP: 130/68 (!) 147/83  Pulse: 88 73  Resp: 17 13  Temp: 98.1 F (36.7 C) 98.7 F (37.1 C)  SpO2: 96% 97%   Alert NAD Sinus, regular EWOB  Creat - 5.4  CAD, acute renal insufficiency - will hold off on CABG until creat downtrends. - will continue to follow  Chase Clark

## 2019-01-24 NOTE — Progress Notes (Signed)
Inpatient Diabetes Program Recommendations  AACE/ADA: New Consensus Statement on Inpatient Glycemic Control (2015)  Target Ranges:  Prepandial:   less than 140 mg/dL      Peak postprandial:   less than 180 mg/dL (1-2 hours)      Critically ill patients:  140 - 180 mg/dL   Lab Results  Component Value Date   GLUCAP 309 (H) 01/24/2019   HGBA1C 9.3 (H) 01/19/2019    Review of Glycemic Control Results for Chase Clark, Chase Clark" (MRN [id number]) as of 01/24/2019 09:42  Ref. Range 01/23/2019 06:41 01/23/2019 11:02 01/23/2019 15:57 01/23/2019 22:35 01/24/2019 06:56  Glucose-Capillary Latest Ref Range: 70 - 99 mg/dL 169 (H) 167 (H) 146 (H) 185 (H) 309 (H)   Diabetes history: DM 2 Outpatient Diabetes medications:  Lantus 40 units bid, Novolog 15 units tid with meals  Current orders for Inpatient glycemic control:  Novolog 0-15 units tid with meals Inpatient Diabetes Program Recommendations:   Please consider adding Lantus 20 units daily.    Thanks,  Adah Perl, RN, BC-ADM Inpatient Diabetes Coordinator Pager [phone] (8a-5p)

## 2019-01-24 NOTE — Plan of Care (Signed)
  Problem: Education: Goal: Understanding of CV disease, CV risk reduction, and recovery process will improve Outcome: Progressing   Problem: Activity: Goal: Ability to return to baseline activity level will improve Outcome: Progressing   Problem: Cardiovascular: Goal: Ability to achieve and maintain adequate cardiovascular perfusion will improve Outcome: Progressing Goal: Vascular access site(s) Level 0-1 will be maintained Outcome: Progressing   Problem: Health Behavior/Discharge Planning: Goal: Ability to safely manage health-related needs after discharge will improve Outcome: Progressing   Problem: Education: Goal: Knowledge of General Education information will improve Description: Including pain rating scale, medication(s)/side effects and non-pharmacologic comfort measures Outcome: Progressing   Problem: Health Behavior/Discharge Planning: Goal: Ability to manage health-related needs will improve Outcome: Progressing   Problem: Clinical Measurements: Goal: Ability to maintain clinical measurements within normal limits will improve Outcome: Progressing Goal: Will remain free from infection Outcome: Progressing Goal: Respiratory complications will improve Outcome: Progressing   Problem: Nutrition: Goal: Adequate nutrition will be maintained Outcome: Progressing   Problem: Coping: Goal: Level of anxiety will decrease Outcome: Progressing   Problem: Elimination: Goal: Will not experience complications related to bowel motility Outcome: Progressing Goal: Will not experience complications related to urinary retention Outcome: Progressing   Problem: Pain Managment: Goal: General experience of comfort will improve Outcome: Progressing   Problem: Safety: Goal: Ability to remain free from injury will improve Outcome: Progressing

## 2019-01-25 LAB — RENAL FUNCTION PANEL
Albumin: 2.6 g/dL — ABNORMAL LOW (ref 3.5–5.0)
Anion gap: 13 (ref 5–15)
BUN: 44 mg/dL — ABNORMAL HIGH (ref 6–20)
CO2: 23 mmol/L (ref 22–32)
Calcium: 8.9 mg/dL (ref 8.9–10.3)
Chloride: 99 mmol/L (ref 98–111)
Creatinine, Ser: 5.48 mg/dL — ABNORMAL HIGH (ref 0.61–1.24)
GFR calc Af Amer: 13 mL/min — ABNORMAL LOW (ref >60)
GFR calc non Af Amer: 11 mL/min — ABNORMAL LOW (ref >60)
Glucose, Bld: 132 mg/dL — ABNORMAL HIGH (ref 70–99)
Phosphorus: 5.2 mg/dL — ABNORMAL HIGH (ref 2.5–4.6)
Potassium: 4.3 mmol/L (ref 3.5–5.1)
Sodium: 135 mmol/L (ref 135–145)

## 2019-01-25 LAB — CBC
HCT: 28 % — ABNORMAL LOW (ref 39.0–52.0)
Hemoglobin: 9.8 g/dL — ABNORMAL LOW (ref 13.0–17.0)
MCH: 30.6 pg (ref 26.0–34.0)
MCHC: 35 g/dL (ref 30.0–36.0)
MCV: 87.5 fL (ref 80.0–100.0)
Platelets: 165 10*3/uL (ref 150–400)
RBC: 3.2 MIL/uL — ABNORMAL LOW (ref 4.22–5.81)
RDW: 13.6 % (ref 11.5–15.5)
WBC: 9.1 10*3/uL (ref 4.0–10.5)
nRBC: 0 % (ref 0.0–0.2)

## 2019-01-25 LAB — GLUCOSE, CAPILLARY
Glucose-Capillary: 103 mg/dL — ABNORMAL HIGH (ref 70–99)
Glucose-Capillary: 160 mg/dL — ABNORMAL HIGH (ref 70–99)
Glucose-Capillary: 160 mg/dL — ABNORMAL HIGH (ref 70–99)
Glucose-Capillary: 139 mg/dL — ABNORMAL HIGH (ref 70–99)

## 2019-01-25 LAB — HEPARIN LEVEL (UNFRACTIONATED): Heparin Unfractionated: 0.36 IU/mL (ref 0.30–0.70)

## 2019-01-25 MED ORDER — FUROSEMIDE 10 MG/ML IJ SOLN
60.0000 mg | Freq: Every day | INTRAMUSCULAR | Status: DC
  Administered 2019-01-26: 60 mg via INTRAVENOUS
  Filled 2019-01-25: qty 6

## 2019-01-25 NOTE — Progress Notes (Signed)
     [address]       [place] [address]             [phone]       No events.  No pain last night  Vitals:   01/25/19 0954 01/25/19 1111  BP: (!) 157/85 (!) 144/77  Pulse:  70  Resp:  19  Temp:  98 F (36.7 C)  SpO2:  97%   Alert NAD Sinus, regular EWOB  Creat - 5.4 stable from yesterday  CAD, acute renal insufficiency - will hold off on CABG until creat downtrends.  Likely plateau - will continue to follow  Lajuana Matte

## 2019-01-25 NOTE — Progress Notes (Signed)
Patient ID: KRISTAN VOTTA, male   DOB: [birth year], 53 y.o.   MRN: [id number] S: Feeling better today but still no BM O:BP (!) 144/77 (BP Location: Left Arm)   Pulse 70   Temp 98 F (36.7 C) (Oral)   Resp 19   Ht 5\' 10"  (1.778 m)   Wt 108.6 kg   SpO2 97%   BMI 34.35 kg/m   Intake/Output Summary (Last 24 hours) at 01/25/2019 1412 Last data filed at 01/25/2019 1240 Gross per 24 hour  Intake 685 ml  Output 700 ml  Net -15 ml   Intake/Output: I/O last 3 completed shifts: In: 101 [P.O.:330; I.V.:595] Out: 625 [Urine:625]  Intake/Output this shift:  Total I/O In: 3 [I.V.:3] Out: 700 [Urine:700] Weight change:  Gen: NAD CVS: no rub Resp: CTA Abd: +BS, soft, NT Ext: trace edema of RLE, s/p LBKA  Recent Labs  Lab 01/19/19 0923 01/20/19 0029 01/21/19 0233 01/22/19 0223 01/23/19 0228 01/24/19 0323 01/25/19 0223  NA 138 137 136 136 136 135 135  K 4.4 4.0 4.0 4.2 4.2 4.2 4.3  CL 105 104 101 102 102 101 99  CO2 25 25 24 24 24 24 23   GLUCOSE 97 198* 189* 208* 165* 244* 132*  BUN 28* 30* 28* 30* 36* 41* 44*  CREATININE 2.89* 3.09* 2.78* 3.15* 4.35* 5.42* 5.48*  ALBUMIN 2.8* 2.5*  --  2.5* 2.4* 2.7* 2.6*  CALCIUM 8.5* 8.3* 8.6* 8.6* 8.4* 8.9 8.9  PHOS  --   --   --  4.8* 5.1* 5.3* 5.2*  AST 21 18  --   --   --   --   --   ALT 16 14  --   --   --   --   --    Liver Function Tests: Recent Labs  Lab 01/19/19 0923 01/20/19 0029  01/23/19 0228 01/24/19 0323 01/25/19 0223  AST 21 18  --   --   --   --   ALT 16 14  --   --   --   --   ALKPHOS 64 60  --   --   --   --   BILITOT 1.2 1.0  --   --   --   --   PROT 6.0* 5.4*  --   --   --   --   ALBUMIN 2.8* 2.5*   < > 2.4* 2.7* 2.6*   < > = values in this interval not displayed.   No results for input(s): LIPASE, AMYLASE in the last 168 hours. No results for input(s): AMMONIA in the last 168 hours. CBC: Recent Labs  Lab 01/21/19 0233 01/22/19 0223 01/23/19 0228 01/24/19 0323 01/25/19 0223  WBC 7.0 7.3 7.6 7.2 9.1   HGB 10.7* 10.0* 9.2* 9.6* 9.8*  HCT 31.5* 30.2* 28.1* 29.3* 28.0*  MCV 88.5 88.3 88.9 88.8 87.5  PLT 160 160 155 166 165   Cardiac Enzymes: No results for input(s): CKTOTAL, CKMB, CKMBINDEX, TROPONINI in the last 168 hours. CBG: Recent Labs  Lab 01/24/19 1107 01/24/19 1643 01/24/19 2117 01/25/19 0625 01/25/19 1109  GLUCAP 164* 119* 99 103* 160*    Iron Studies:  Recent Labs    01/24/19 0323  IRON 70  TIBC 328  FERRITIN 46   Studies/Results: No results found. Marland Kitchen amLODipine  10 mg Oral Daily  . aspirin EC  81 mg Oral Daily  . atorvastatin  80 mg Oral q1800  . docusate sodium  100 mg Oral Daily  .  furosemide  80 mg Intravenous Daily  . gabapentin  300 mg Oral QPC breakfast  . hydrALAZINE  50 mg Oral TID  . insulin aspart  0-15 Units Subcutaneous TID WC  . insulin glargine  20 Units Subcutaneous Daily  . isosorbide mononitrate  60 mg Oral Daily  . metoprolol tartrate  50 mg Oral BID  . pantoprazole  40 mg Oral Daily  . polyethylene glycol  17 g Oral Daily  . sodium chloride flush  3 mL Intravenous Once  . sodium chloride flush  3 mL Intravenous Q12H    BMET    Component Value Date/Time   NA 135 01/25/2019 0223   K 4.3 01/25/2019 0223   K 4.5 12/29/2013 1521   CL 99 01/25/2019 0223   CO2 23 01/25/2019 0223   GLUCOSE 132 (H) 01/25/2019 0223   BUN 44 (H) 01/25/2019 0223   CREATININE 5.48 (H) 01/25/2019 0223   CREATININE 2.42 (H) 12/31/2018 1454   CALCIUM 8.9 01/25/2019 0223   GFRNONAA 11 (L) 01/25/2019 0223   GFRAA 13 (L) 01/25/2019 0223   CBC    Component Value Date/Time   WBC 9.1 01/25/2019 0223   RBC 3.20 (L) 01/25/2019 0223   HGB 9.8 (L) 01/25/2019 0223   HCT 28.0 (L) 01/25/2019 0223   HCT 24.9 (L) 09/04/2017 2358   PLT 165 01/25/2019 0223   MCV 87.5 01/25/2019 0223   MCH 30.6 01/25/2019 0223   MCHC 35.0 01/25/2019 0223   RDW 13.6 01/25/2019 0223   LYMPHSABS 1.7 09/15/2017 0532   MONOABS 0.6 09/15/2017 0532   EOSABS 0.5 09/15/2017 0532    BASOSABS 0.1 09/15/2017 0532    Assessment/Plan:  1. 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  No indication for dialysis at this time and will continue to follow along closely 1. Cr appears to have reached a plateau and hopefully will start to improve over the next 24-48 hours. 2. ACS- s/p cath with severe LAD and failed attempt for PCI now for CABG when renal function stabilizes/improves.  Currently without chest pain. 3. HTN/volume- variable but stable.   4. CHF- good UOP and has diuresed over 5 liters since admission, however BUN/Cr still climbing so decreased lasix to 80mg  daily 01/24/19.  Will continue to follow.  5. Anemia of CKD with some blood loss. Transfuse as needed. 6. Constipation-  started colace and miralax but may need lactulose.  Avoid mg citrate and fleets enemas due to AKI.  Donetta Potts, MD [employer] [phone]

## 2019-01-25 NOTE — Progress Notes (Signed)
Progress Note  Patient Name: Chase Clark Date of Encounter: 01/25/2019  Primary Cardiologist: No primary care provider on file. Dr. Ubaldo Glassing  Subjective   No chest pain.  Breathing better.  Inpatient Medications    Scheduled Meds: . amLODipine  10 mg Oral Daily  . aspirin EC  81 mg Oral Daily  . atorvastatin  80 mg Oral q1800  . docusate sodium  100 mg Oral Daily  . furosemide  80 mg Intravenous Daily  . gabapentin  300 mg Oral QPC breakfast  . hydrALAZINE  50 mg Oral TID  . insulin aspart  0-15 Units Subcutaneous TID WC  . insulin glargine  20 Units Subcutaneous Daily  . isosorbide mononitrate  60 mg Oral Daily  . metoprolol tartrate  50 mg Oral BID  . pantoprazole  40 mg Oral Daily  . polyethylene glycol  17 g Oral Daily  . sodium chloride flush  3 mL Intravenous Once  . sodium chloride flush  3 mL Intravenous Q12H   Continuous Infusions: . sodium chloride    . sodium chloride    . heparin 1,700 Units/hr (01/25/19 1240)   PRN Meds: sodium chloride, sodium chloride, acetaminophen, alum & mag hydroxide-simeth, calcium carbonate, morphine injection, ondansetron (ZOFRAN) IV, sodium chloride, sodium chloride flush   Vital Signs    Vitals:   01/25/19 0721 01/25/19 0839 01/25/19 0954 01/25/19 1111  BP:  (!) 153/86 (!) 157/85 (!) 144/77  Pulse: 71 65  70  Resp: 13 13  19   Temp:  98.2 F (36.8 C)  98 F (36.7 C)  TempSrc:  Oral  Oral  SpO2: 96% 100%  97%  Weight:      Height:        Intake/Output Summary (Last 24 hours) at 01/25/2019 1401 Last data filed at 01/25/2019 1240 Gross per 24 hour  Intake 685 ml  Output 700 ml  Net -15 ml   Last 3 Weights 01/21/2019 01/20/2019 01/19/2019  Weight (lbs) 239 lb 6.7 oz 238 lb 12.1 oz 241 lb 6.5 oz  Weight (kg) 108.6 kg 108.3 kg 109.5 kg      Telemetry    NSR - Personally Reviewed  ECG      Physical Exam   GEN: No acute distress.   Neck: No JVD Cardiac: RRR, no murmurs, rubs, or gallops.  Respiratory:  Clear to auscultation bilaterally. GI: Soft, nontender, non-distended  MS: decreased leg edema; No deformity. Neuro:  Nonfocal  Psych: Normal affect   Labs    High Sensitivity Troponin:   Recent Labs  Lab 01/19/19 0833 01/19/19 0923  TROPONINIHS 10 10      Chemistry Recent Labs  Lab 01/19/19 0923 01/20/19 0029  01/23/19 0228 01/24/19 0323 01/25/19 0223  NA 138 137   < > 136 135 135  K 4.4 4.0   < > 4.2 4.2 4.3  CL 105 104   < > 102 101 99  CO2 25 25   < > 24 24 23   GLUCOSE 97 198*   < > 165* 244* 132*  BUN 28* 30*   < > 36* 41* 44*  CREATININE 2.89* 3.09*   < > 4.35* 5.42* 5.48*  CALCIUM 8.5* 8.3*   < > 8.4* 8.9 8.9  PROT 6.0* 5.4*  --   --   --   --   ALBUMIN 2.8* 2.5*   < > 2.4* 2.7* 2.6*  AST 21 18  --   --   --   --  ALT 16 14  --   --   --   --   ALKPHOS 64 60  --   --   --   --   BILITOT 1.2 1.0  --   --   --   --   GFRNONAA 23* 22*   < > 14* 11* 11*  GFRAA 27* 25*   < > 17* 13* 13*  ANIONGAP 8 8   < > 10 10 13    < > = values in this interval not displayed.     Hematology Recent Labs  Lab 01/23/19 0228 01/24/19 0323 01/25/19 0223  WBC 7.6 7.2 9.1  RBC 3.16* 3.30* 3.20*  HGB 9.2* 9.6* 9.8*  HCT 28.1* 29.3* 28.0*  MCV 88.9 88.8 87.5  MCH 29.1 29.1 30.6  MCHC 32.7 32.8 35.0  RDW 13.5 13.5 13.6  PLT 155 166 165    BNPNo results for input(s): BNP, PROBNP in the last 168 hours.   DDimer No results for input(s): DDIMER in the last 168 hours.   Radiology    No results found.  Cardiac Studies   Cath results reviewed  Patient Profile     53 y.o. male with CAD, ARF  Assessment & Plan    Cr appears to have reached a plateau.  Volume status improved.  WIll decrease dose of Lasix to 60 mg IV daily.   No angina.  CABG once kidneys have improved.  For questions or updates, please contact [place] Please consult [email or web address] for contact info under        Signed, Larae Grooms, MD  01/25/2019, 2:01 PM

## 2019-01-25 NOTE — Progress Notes (Signed)
ANTICOAGULATION CONSULT NOTE - Follow-Up Consult  Pharmacy Consult for Heparin  Indication: chest pain/ACS  No Known Allergies  Patient Measurements: Height: 5\' 10"  (177.8 cm) Weight: 239 lb 6.7 oz (108.6 kg) IBW/kg (Calculated) : 73 Heparin Dosing Weight: 97.6 kg  Vital Signs: Temp: 98 F (36.7 C) (10/13 0352) Temp Source: Oral (10/13 0352) BP: 143/77 (10/13 0352) Pulse Rate: 65 (10/13 0352)  Labs: Recent Labs    01/23/19 0228 01/24/19 0323 01/25/19 0223  HGB 9.2* 9.6* 9.8*  HCT 28.1* 29.3* 28.0*  PLT 155 166 165  HEPARINUNFRC 0.35 0.36 0.36  CREATININE 4.35* 5.42* 5.48*    Estimated Creatinine Clearance: 18.8 mL/min (A) (by C-G formula based on SCr of 5.48 mg/dL (H)).   Medical History: Past Medical History:  Diagnosis Date  . Arthritis   . Asthma    as child  . Coronary artery disease    DES DIAG1 11/20/09 [facility])  . Diabetes mellitus without complication ([place])   . Diabetic osteomyelitis ([place])   . Diabetic ulcer of foot with muscle involvement without evidence of necrosis ([place])    DIABETIC ULCERATIONS ASSOCIATED WITH IRRITATION LATERAL ANKLE LEFT GREATER THAN RIGHT WITH MILD CELLULITIS   . Diverticulosis   . DKA (diabetic ketoacidoses) ([place]) 08/23/2017  . Edema, lower extremity   . Fatty liver   . GERD (gastroesophageal reflux disease)   . Gilbert's disease   . Glaucoma   . Heart disease 2011   Patient has stent for 80% blockage  . Hyperlipidemia   . Hypertension   . Neuropathy   . Seasonal allergies   . Shoulder pain   . Ulcer of foot ([place]) 08.06.2014   DIABETIC ULCERATIONS ASSOCIATED WITH IRRITATION LATERAL ANKLE LEFT GREATER THAN RIGHT WITH MILD CELLULITIS    Assessment: 45 YOM with known CAD (stent 2011), CKD, DM, HTN, HLD presented to [facility] ED with swelling, SOB, and chest pressure with pain to Left arm. Pt started on IV heparin. Cardiac cath showed severe LAD disease extending from proximal to mid vessel (unable to cross). Plans for CABG when renal  function improved. Off pump CABG was planned for 10/12 but was cancelled because Scr continues to worsen.   Heparin level remains therapeutic this morning. CBC stable. No active bleeding or line issues per nursing.  Goal of Therapy:  Heparin level 0.3-0.7 units/ml Monitor platelets by anticoagulation protocol: Yes   Plan:  -Continue heparin at 1700 units/hr -Monitor daily heparin level and CBC -Monitor for s/sx of bleeding  Kennon Holter, PharmD PGY1 Ambulatory Care Pharmacy Resident [employer] Phone: [phone]

## 2019-01-25 NOTE — Plan of Care (Signed)
  Problem: Education: Goal: Understanding of CV disease, CV risk reduction, and recovery process will improve Outcome: Progressing   Problem: Activity: Goal: Ability to return to baseline activity level will improve Outcome: Progressing   Problem: Cardiovascular: Goal: Vascular access site(s) Level 0-1 will be maintained Outcome: Progressing   Problem: Education: Goal: Knowledge of General Education information will improve Description: Including pain rating scale, medication(s)/side effects and non-pharmacologic comfort measures Outcome: Progressing   Problem: Clinical Measurements: Goal: Ability to maintain clinical measurements within normal limits will improve Outcome: Progressing Goal: Will remain free from infection Outcome: Progressing

## 2019-01-25 NOTE — Plan of Care (Signed)

## 2019-01-26 ENCOUNTER — Encounter (HOSPITAL_COMMUNITY): Payer: Self-pay

## 2019-01-26 ENCOUNTER — Ambulatory Visit (HOSPITAL_COMMUNITY): Admission: RE | Admit: 2019-01-26 | Payer: BC Managed Care – PPO | Source: Ambulatory Visit

## 2019-01-26 LAB — CBC
HCT: 27.8 % — ABNORMAL LOW (ref 39.0–52.0)
Hemoglobin: 9.1 g/dL — ABNORMAL LOW (ref 13.0–17.0)
MCH: 29.4 pg (ref 26.0–34.0)
MCHC: 32.7 g/dL (ref 30.0–36.0)
MCV: 90 fL (ref 80.0–100.0)
Platelets: 181 10*3/uL (ref 150–400)
RBC: 3.09 MIL/uL — ABNORMAL LOW (ref 4.22–5.81)
RDW: 13.7 % (ref 11.5–15.5)
WBC: 7.2 10*3/uL (ref 4.0–10.5)
nRBC: 0 % (ref 0.0–0.2)

## 2019-01-26 LAB — RENAL FUNCTION PANEL
Albumin: 2.4 g/dL — ABNORMAL LOW (ref 3.5–5.0)
Anion gap: 9 (ref 5–15)
BUN: 43 mg/dL — ABNORMAL HIGH (ref 6–20)
CO2: 24 mmol/L (ref 22–32)
Calcium: 8.7 mg/dL — ABNORMAL LOW (ref 8.9–10.3)
Chloride: 103 mmol/L (ref 98–111)
Creatinine, Ser: 4.97 mg/dL — ABNORMAL HIGH (ref 0.61–1.24)
GFR calc Af Amer: 14 mL/min — ABNORMAL LOW (ref >60)
GFR calc non Af Amer: 12 mL/min — ABNORMAL LOW (ref >60)
Glucose, Bld: 199 mg/dL — ABNORMAL HIGH (ref 70–99)
Phosphorus: 5.1 mg/dL — ABNORMAL HIGH (ref 2.5–4.6)
Potassium: 4.3 mmol/L (ref 3.5–5.1)
Sodium: 136 mmol/L (ref 135–145)

## 2019-01-26 LAB — HEPARIN LEVEL (UNFRACTIONATED): Heparin Unfractionated: 0.27 IU/mL — ABNORMAL LOW (ref 0.30–0.70)

## 2019-01-26 LAB — GLUCOSE, CAPILLARY
Glucose-Capillary: 177 mg/dL — ABNORMAL HIGH (ref 70–99)
Glucose-Capillary: 222 mg/dL — ABNORMAL HIGH (ref 70–99)
Glucose-Capillary: 187 mg/dL — ABNORMAL HIGH (ref 70–99)
Glucose-Capillary: 255 mg/dL — ABNORMAL HIGH (ref 70–99)

## 2019-01-26 LAB — PREPARE RBC (CROSSMATCH)

## 2019-01-26 LAB — BLOOD GAS, ARTERIAL
Acid-Base Excess: 0.6 mmol/L (ref 0.0–2.0)
Bicarbonate: 24.7 mmol/L (ref 20.0–28.0)
Drawn by: 236041
FIO2: 21
O2 Saturation: 96.5 %
Patient temperature: 98.6
pCO2 arterial: 39.9 mmHg (ref 32.0–48.0)
pH, Arterial: 7.408 (ref 7.350–7.450)
pO2, Arterial: 85.7 mmHg (ref 83.0–108.0)

## 2019-01-26 MED ORDER — SODIUM CHLORIDE 0.9 % IV SOLN
750.0000 mg | INTRAVENOUS | Status: DC
  Filled 2019-01-26: qty 750

## 2019-01-26 MED ORDER — VANCOMYCIN HCL 10 G IV SOLR
1500.0000 mg | INTRAVENOUS | Status: AC
  Administered 2019-01-27: 08:00:00 1500 mg via INTRAVENOUS
  Filled 2019-01-26: qty 1500

## 2019-01-26 MED ORDER — INSULIN REGULAR(HUMAN) IN NACL 100-0.9 UT/100ML-% IV SOLN
INTRAVENOUS | Status: AC
  Administered 2019-01-27: 09:00:00 1.5 [IU]/h via INTRAVENOUS
  Filled 2019-01-26: qty 100

## 2019-01-26 MED ORDER — DOPAMINE-DEXTROSE 3.2-5 MG/ML-% IV SOLN
0.0000 ug/kg/min | INTRAVENOUS | Status: DC
  Filled 2019-01-26: qty 250

## 2019-01-26 MED ORDER — SODIUM CHLORIDE 0.9 % IV SOLN
INTRAVENOUS | Status: DC
  Filled 2019-01-26: qty 30

## 2019-01-26 MED ORDER — TRANEXAMIC ACID (OHS) BOLUS VIA INFUSION
15.0000 mg/kg | INTRAVENOUS | Status: AC
  Administered 2019-01-27: 1629 mg via INTRAVENOUS
  Filled 2019-01-26: qty 1629

## 2019-01-26 MED ORDER — MANNITOL 20 % IV SOLN
INTRAVENOUS | Status: DC
  Filled 2019-01-26: qty 13

## 2019-01-26 MED ORDER — LORAZEPAM 2 MG/ML IJ SOLN
1.0000 mg | Freq: Every evening | INTRAMUSCULAR | Status: AC | PRN
  Administered 2019-01-26: 1 mg via INTRAVENOUS
  Filled 2019-01-26: qty 1

## 2019-01-26 MED ORDER — SODIUM CHLORIDE 0.9 % IV SOLN
1.5000 g | INTRAVENOUS | Status: AC
  Administered 2019-01-27: 1.5 g via INTRAVENOUS
  Filled 2019-01-26: qty 1.5

## 2019-01-26 MED ORDER — TRANEXAMIC ACID 1000 MG/10ML IV SOLN
1.5000 mg/kg/h | INTRAVENOUS | Status: AC
  Administered 2019-01-27: 1.5 mg/kg/h via INTRAVENOUS
  Filled 2019-01-26: qty 25

## 2019-01-26 MED ORDER — BISACODYL 5 MG PO TBEC
5.0000 mg | DELAYED_RELEASE_TABLET | Freq: Once | ORAL | Status: AC
  Administered 2019-01-26: 5 mg via ORAL
  Filled 2019-01-26: qty 1

## 2019-01-26 MED ORDER — TRANEXAMIC ACID (OHS) PUMP PRIME SOLUTION
2.0000 mg/kg | INTRAVENOUS | Status: DC
  Filled 2019-01-26: qty 2.17

## 2019-01-26 MED ORDER — DEXMEDETOMIDINE HCL IN NACL 400 MCG/100ML IV SOLN
0.1000 ug/kg/h | INTRAVENOUS | Status: AC
  Administered 2019-01-27: .7 ug/kg/h via INTRAVENOUS
  Filled 2019-01-26: qty 100

## 2019-01-26 MED ORDER — CHLORHEXIDINE GLUCONATE CLOTH 2 % EX PADS
6.0000 | MEDICATED_PAD | Freq: Once | CUTANEOUS | Status: AC
  Administered 2019-01-26: 6 via TOPICAL

## 2019-01-26 MED ORDER — CHLORHEXIDINE GLUCONATE 0.12 % MT SOLN
15.0000 mL | Freq: Once | OROMUCOSAL | Status: AC
  Administered 2019-01-27: 15 mL via OROMUCOSAL
  Filled 2019-01-26: qty 15

## 2019-01-26 MED ORDER — PHENYLEPHRINE HCL-NACL 20-0.9 MG/250ML-% IV SOLN
30.0000 ug/min | INTRAVENOUS | Status: AC
  Administered 2019-01-27: 20 ug/min via INTRAVENOUS
  Filled 2019-01-26: qty 250

## 2019-01-26 MED ORDER — POTASSIUM CHLORIDE 2 MEQ/ML IV SOLN
80.0000 meq | INTRAVENOUS | Status: DC
  Filled 2019-01-26: qty 40

## 2019-01-26 MED ORDER — TEMAZEPAM 15 MG PO CAPS: 15.0000 mg | ORAL_CAPSULE | Freq: Once | ORAL | Status: DC | PRN

## 2019-01-26 MED ORDER — EPINEPHRINE HCL 5 MG/250ML IV SOLN IN NS
0.0000 ug/min | INTRAVENOUS | Status: DC
  Filled 2019-01-26: qty 250

## 2019-01-26 MED ORDER — CHLORHEXIDINE GLUCONATE CLOTH 2 % EX PADS: 6.0000 | MEDICATED_PAD | Freq: Once | CUTANEOUS | Status: DC

## 2019-01-26 MED ORDER — NITROGLYCERIN IN D5W 200-5 MCG/ML-% IV SOLN
2.0000 ug/min | INTRAVENOUS | Status: AC
  Administered 2019-01-27: 15 ug/min via INTRAVENOUS
  Filled 2019-01-26: qty 250

## 2019-01-26 MED ORDER — PLASMA-LYTE 148 IV SOLN
INTRAVENOUS | Status: DC
  Filled 2019-01-26: qty 2.5

## 2019-01-26 MED ORDER — MILRINONE LACTATE IN DEXTROSE 20-5 MG/100ML-% IV SOLN
0.3000 ug/kg/min | INTRAVENOUS | Status: DC
  Filled 2019-01-26: qty 100

## 2019-01-26 MED ORDER — METOPROLOL TARTRATE 12.5 MG HALF TABLET
12.5000 mg | ORAL_TABLET | Freq: Once | ORAL | Status: AC
  Administered 2019-01-27: 12.5 mg via ORAL
  Filled 2019-01-26 (×2): qty 1

## 2019-01-26 NOTE — Plan of Care (Signed)
  Problem: Education: Goal: Understanding of CV disease, CV risk reduction, and recovery process will improve Outcome: Progressing   Problem: Activity: Goal: Ability to return to baseline activity level will improve Outcome: Progressing   Problem: Cardiovascular: Goal: Ability to achieve and maintain adequate cardiovascular perfusion will improve Outcome: Progressing Goal: Vascular access site(s) Level 0-1 will be maintained Outcome: Completed/Met   Problem: Health Behavior/Discharge Planning: Goal: Ability to safely manage health-related needs after discharge will improve Outcome: Progressing   

## 2019-01-26 NOTE — Plan of Care (Signed)
  Problem: Education: Goal: Understanding of CV disease, CV risk reduction, and recovery process will improve Outcome: Progressing Goal: Individualized Educational Video(s) Outcome: Progressing   

## 2019-01-26 NOTE — Progress Notes (Addendum)
Progress Note  Patient Name: Chase Clark Date of Encounter: 01/26/2019  Primary Cardiologist: No primary care provider on file.   Subjective   Feeling well this morning. CABG planned for tomorrow.   Inpatient Medications    Scheduled Meds: . amLODipine  10 mg Oral Daily  . aspirin EC  81 mg Oral Daily  . atorvastatin  80 mg Oral q1800  . docusate sodium  100 mg Oral Daily  . furosemide  60 mg Intravenous Daily  . gabapentin  300 mg Oral QPC breakfast  . hydrALAZINE  50 mg Oral TID  . insulin aspart  0-15 Units Subcutaneous TID WC  . insulin glargine  20 Units Subcutaneous Daily  . isosorbide mononitrate  60 mg Oral Daily  . metoprolol tartrate  50 mg Oral BID  . pantoprazole  40 mg Oral Daily  . polyethylene glycol  17 g Oral Daily  . sodium chloride flush  3 mL Intravenous Once  . sodium chloride flush  3 mL Intravenous Q12H   Continuous Infusions: . sodium chloride    . sodium chloride    . heparin 1,700 Units/hr (01/25/19 1851)   PRN Meds: sodium chloride, sodium chloride, acetaminophen, alum & mag hydroxide-simeth, calcium carbonate, LORazepam, morphine injection, ondansetron (ZOFRAN) IV, sodium chloride, sodium chloride flush   Vital Signs    Vitals:   01/25/19 2348 01/26/19 0407 01/26/19 0717 01/26/19 0816  BP: (!) 142/76 121/66 134/85 135/75  Pulse: 61 60 60 (!) 58  Resp: 15 10 16 10   Temp: 97.8 F (36.6 C) 97.9 F (36.6 C) 97.8 F (36.6 C) 97.9 F (36.6 C)  TempSrc: Oral Oral Oral Oral  SpO2: 96% 96% 96% 96%  Weight:      Height:        Intake/Output Summary (Last 24 hours) at 01/26/2019 1749 Last data filed at 01/26/2019 0600 Gross per 24 hour  Intake 408.2 ml  Output 700 ml  Net -291.8 ml   Last 3 Weights 01/21/2019 01/20/2019 01/19/2019  Weight (lbs) 239 lb 6.7 oz 238 lb 12.1 oz 241 lb 6.5 oz  Weight (kg) 108.6 kg 108.3 kg 109.5 kg      Telemetry    SB - Personally Reviewed  ECG    No new tracing  Physical Exam  Pleasant WM,  sitting up in bed.  GEN: No acute distress.   Neck: No JVD Cardiac: RRR, no murmurs, rubs, or gallops.  Respiratory: Clear to auscultation bilaterally. 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 Neuro:  Nonfocal  Psych: Normal affect   Labs    High Sensitivity Troponin:   Recent Labs  Lab 01/19/19 0833 01/19/19 0923  TROPONINIHS 10 10      Chemistry Recent Labs  Lab 01/19/19 0923 01/20/19 0029  01/24/19 0323 01/25/19 0223 01/26/19 0255  NA 138 137   < > 135 135 136  K 4.4 4.0   < > 4.2 4.3 4.3  CL 105 104   < > 101 99 103  CO2 25 25   < > 24 23 24   GLUCOSE 97 198*   < > 244* 132* 199*  BUN 28* 30*   < > 41* 44* 43*  CREATININE 2.89* 3.09*   < > 5.42* 5.48* 4.97*  CALCIUM 8.5* 8.3*   < > 8.9 8.9 8.7*  PROT 6.0* 5.4*  --   --   --   --   ALBUMIN 2.8* 2.5*   < >  2.7* 2.6* 2.4*  AST 21 18  --   --   --   --   ALT 16 14  --   --   --   --   ALKPHOS 64 60  --   --   --   --   BILITOT 1.2 1.0  --   --   --   --   GFRNONAA 23* 22*   < > 11* 11* 12*  GFRAA 27* 25*   < > 13* 13* 14*  ANIONGAP 8 8   < > 10 13 9    < > = values in this interval not displayed.     Hematology Recent Labs  Lab 01/24/19 0323 01/25/19 0223 01/26/19 0255  WBC 7.2 9.1 7.2  RBC 3.30* 3.20* 3.09*  HGB 9.6* 9.8* 9.1*  HCT 29.3* 28.0* 27.8*  MCV 88.8 87.5 90.0  MCH 29.1 30.6 29.4  MCHC 32.8 35.0 32.7  RDW 13.5 13.6 13.7  PLT 166 165 181    BNPNo results for input(s): BNP, PROBNP in the last 168 hours.   DDimer No results for input(s): DDIMER in the last 168 hours.   Radiology    No results found.  Cardiac Studies   Cath: 01/21/19  Failed PCI of mid LAD due to failure to cross with wire because of tortuosity and calcification.  RECOMMENDATION:   Sequential off-pump LIMA to LAD diagonal if possible.  High risk for kidney failure requiring dialysis either as a result of contrast used today, 90 cc or certainly after bypass surgery.  Diagnostic  Dominance: Right    Patient Profile     53 y.o. male with a history of hypertension, uncontrolled diabetes mellitus, hyperlipidemia, mild PAD, CKD stage III with proteinuria, CAD with silent ischemias/[facility] concerning for myocardial infarction as initial EKG reviewed T wave inversion and minimal ST elevation at the inferior lateral leads however now chest pain-free with repeat EKG unremarkable.  Assessment & Plan    1. CAD/Aborted Lateral MI: attempted cath on 10/9 with severe disease of the LAD. Complex anatomy with calcification and tortuosity. Unsuccessful PCI by Dr. Tamala Julian due to inability to cross lesion with  Wire. TCTS consulted we agreed with CABG. Unfortunately has significant rise in Cr. Now reached plateau and trending down. No recurrent angina. Planned for CABG tomorrow.  2. CKD stage III/nephrotic syndrome: nephrology following. Cr peaked 5.48 and now trending down. Lasix reduced to 60mg  daily yesterday.  3. HTN: stable with current therapy, amlodipine, hydralazine, and lopressor.  4. HL: on high dose statin.  5. DM: on SSI and lantus  For questions or updates, please contact [place] Please consult [email or web address] for contact info under        Signed, Reino Bellis, NP  01/26/2019, 9:22 AM    I have examined the patient and reviewed assessment and plan and discussed with patient.  Agree with above as stated.  Cr improved.  Plan for CABG in AM.  Last dose of Lasix today.  Volume status appears to have improved.   Larae Grooms

## 2019-01-26 NOTE — Plan of Care (Signed)

## 2019-01-26 NOTE — Anesthesia Preprocedure Evaluation (Addendum)
Anesthesia Evaluation  Patient identified by MRN, date of birth, ID band Patient awake    Reviewed: Allergy & Precautions, NPO status , Patient's Chart, lab work & pertinent test results  History of Anesthesia Complications Negative for: history of anesthetic complications  Airway Mallampati: I  TM Distance: >3 FB Neck ROM: Full    Dental  (+) Caps, Dental Advisory Given, Implants   Pulmonary asthma (childhood) , former smoker,    Pulmonary exam normal        Cardiovascular hypertension, Pt. on medications and Pt. on home beta blockers + angina + CAD, + Cardiac Stents and + Peripheral Vascular Disease  Normal cardiovascular exam   '20 Cath - Acute Mrg lesion is 60% stenosed. Previously placed 1st Diag stent (unknown type) is widely patent. Mid LAD lesion is 90% stenosed.  '20 Carotid US - 1-39% b/l ICAS  '20 TTE - EF 55 to 60%. LA size was mildly dilated. Mild MR. Trivial TR. Mild aortic valve sclerosis without stenosis.    Neuro/Psych negative neurological ROS  negative psych ROS   GI/Hepatic Neg liver ROS, GERD  Medicated,  Endo/Other  diabetes, Type 2, Insulin Dependent Obesity   Renal/GU Renal InsufficiencyRenal disease     Musculoskeletal  (+) Arthritis ,   Abdominal   Peds  Hematology  (+) anemia ,   Anesthesia Other Findings   Reproductive/Obstetrics                           Anesthesia Physical Anesthesia Plan  ASA: IV  Anesthesia Plan: General   Post-op Pain Management:    Induction: Intravenous  PONV Risk Score and Plan: 2 and Treatment may vary due to age or medical condition  Airway Management Planned: Oral ETT  Additional Equipment: Arterial line, CVP, TEE, PA Cath and Ultrasound Guidance Line Placement  Intra-op Plan:   Post-operative Plan: Post-operative intubation/ventilation  Informed Consent: I have reviewed the patients History and Physical,  chart, labs and discussed the procedure including the risks, benefits and alternatives for the proposed anesthesia with the patient or authorized representative who has indicated his/her understanding and acceptance.     Dental advisory given  Plan Discussed with: CRNA and Anesthesiologist  Anesthesia Plan Comments:        Anesthesia Quick Evaluation

## 2019-01-26 NOTE — Progress Notes (Signed)
Response to Spiritual Care for Advance Directive notary.  Accomplished.  Visited with Mr and Mrs. [place] in waiting area outside [place] with notary and witnesses.  Offered brief prayer.  Chaplain will follow up post surgery.  [place] Chaplain Resident Pager [phone]

## 2019-01-26 NOTE — Progress Notes (Signed)
Patient ID: Chase Clark, male   DOB: 17-Oct-1965, 53 y.o.   MRN: [id number] S: feeling better O:BP (!) 145/79   Pulse (!) 58   Temp 97.9 F (36.6 C) (Oral)   Resp 10   Ht 5\' 10"  (1.778 m)   Wt 108.6 kg   SpO2 96%   BMI 34.35 kg/m   Intake/Output Summary (Last 24 hours) at 01/26/2019 1347 Last data filed at 01/26/2019 1006 Gross per 24 hour  Intake 295.97 ml  Output -  Net 295.97 ml   Intake/Output: I/O last 3 completed shifts: In: 1090.2 [P.O.:240; I.V.:850.2] Out: 700 [Urine:700]  Intake/Output this shift:  Total I/O In: 3 [I.V.:3] Out: -  Weight change:  Gen: NAD CVS: no rub Resp: cta Abd: +BS, soft, NT/ND Ext: 1+ edema of right leg s/p LBKA  Recent Labs  Lab 01/20/19 0029 01/21/19 0233 01/22/19 0223 01/23/19 0228 01/24/19 0323 01/25/19 0223 01/26/19 0255  NA 137 136 136 136 135 135 136  K 4.0 4.0 4.2 4.2 4.2 4.3 4.3  CL 104 101 102 102 101 99 103  CO2 25 24 24 24 24 23 24   GLUCOSE 198* 189* 208* 165* 244* 132* 199*  BUN 30* 28* 30* 36* 41* 44* 43*  CREATININE 3.09* 2.78* 3.15* 4.35* 5.42* 5.48* 4.97*  ALBUMIN 2.5*  --  2.5* 2.4* 2.7* 2.6* 2.4*  CALCIUM 8.3* 8.6* 8.6* 8.4* 8.9 8.9 8.7*  PHOS  --   --  4.8* 5.1* 5.3* 5.2* 5.1*  AST 18  --   --   --   --   --   --   ALT 14  --   --   --   --   --   --    Liver Function Tests: Recent Labs  Lab 01/20/19 0029  01/24/19 0323 01/25/19 0223 01/26/19 0255  AST 18  --   --   --   --   ALT 14  --   --   --   --   ALKPHOS 60  --   --   --   --   BILITOT 1.0  --   --   --   --   PROT 5.4*  --   --   --   --   ALBUMIN 2.5*   < > 2.7* 2.6* 2.4*   < > = values in this interval not displayed.   No results for input(s): LIPASE, AMYLASE in the last 168 hours. No results for input(s): AMMONIA in the last 168 hours. CBC: Recent Labs  Lab 01/22/19 0223 01/23/19 0228 01/24/19 0323 01/25/19 0223 01/26/19 0255  WBC 7.3 7.6 7.2 9.1 7.2  HGB 10.0* 9.2* 9.6* 9.8* 9.1*  HCT 30.2* 28.1* 29.3* 28.0* 27.8*  MCV  88.3 88.9 88.8 87.5 90.0  PLT 160 155 166 165 181   Cardiac Enzymes: No results for input(s): CKTOTAL, CKMB, CKMBINDEX, TROPONINI in the last 168 hours. CBG: Recent Labs  Lab 01/25/19 1109 01/25/19 1639 01/25/19 2115 01/26/19 0623 01/26/19 1121  GLUCAP 160* 160* 139* 177* 222*    Iron Studies:  Recent Labs    01/24/19 0323  IRON 70  TIBC 328  FERRITIN 46   Studies/Results: No results found. Marland Kitchen amLODipine  10 mg Oral Daily  . aspirin EC  81 mg Oral Daily  . atorvastatin  80 mg Oral q1800  . docusate sodium  100 mg Oral Daily  . [START ON 01/27/2019] epinephrine  0-10 mcg/min Intravenous To OR  .  furosemide  60 mg Intravenous Daily  . gabapentin  300 mg Oral QPC breakfast  . [START ON 01/27/2019] heparin-papaverine-plasmalyte irrigation   Irrigation To OR  . hydrALAZINE  50 mg Oral TID  . insulin aspart  0-15 Units Subcutaneous TID WC  . insulin glargine  20 Units Subcutaneous Daily  . [START ON 01/27/2019] insulin   Intravenous To OR  . isosorbide mononitrate  60 mg Oral Daily  . [START ON 01/27/2019] [place] Blood Cardioplegia vial (lidocaine/magnesium/mannitol 0.26g-4g-6.4g)   Intracoronary To OR  . metoprolol tartrate  50 mg Oral BID  . pantoprazole  40 mg Oral Daily  . [START ON 01/27/2019] phenylephrine  30-200 mcg/min Intravenous To OR  . polyethylene glycol  17 g Oral Daily  . [START ON 01/27/2019] potassium chloride  80 mEq Other To OR  . sodium chloride flush  3 mL Intravenous Once  . sodium chloride flush  3 mL Intravenous Q12H  . [START ON 01/27/2019] tranexamic acid  15 mg/kg Intravenous To OR  . [START ON 01/27/2019] tranexamic acid  2 mg/kg Intracatheter To OR    BMET    Component Value Date/Time   NA 136 01/26/2019 0255   K 4.3 01/26/2019 0255   K 4.5 12/29/2013 1521   CL 103 01/26/2019 0255   CO2 24 01/26/2019 0255   GLUCOSE 199 (H) 01/26/2019 0255   BUN 43 (H) 01/26/2019 0255   CREATININE 4.97 (H) 01/26/2019 0255   CREATININE 2.42 (H)  12/31/2018 1454   CALCIUM 8.7 (L) 01/26/2019 0255   GFRNONAA 12 (L) 01/26/2019 0255   GFRAA 14 (L) 01/26/2019 0255   CBC    Component Value Date/Time   WBC 7.2 01/26/2019 0255   RBC 3.09 (L) 01/26/2019 0255   HGB 9.1 (L) 01/26/2019 0255   HCT 27.8 (L) 01/26/2019 0255   HCT 24.9 (L) 09/04/2017 2358   PLT 181 01/26/2019 0255   MCV 90.0 01/26/2019 0255   MCH 29.4 01/26/2019 0255   MCHC 32.7 01/26/2019 0255   RDW 13.7 01/26/2019 0255   LYMPHSABS 1.7 09/15/2017 0532   MONOABS 0.6 09/15/2017 0532   EOSABS 0.5 09/15/2017 0532   BASOSABS 0.1 09/15/2017 0532     Assessment/Plan:  1. 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 No indication for dialysis at this time and will continue to follow along closely 1. Cr appears to have reached a plateau and now improving. 2. Continue to follow. 2. ACS- s/p cath with severe LAD and failed attempt for PCI now for CABG when renal function stabilizes/improves. Currently without chest pain. 1. CABG planned for 01/27/19 3. HTN/volume- variable but stable.  4. CHF- good UOP and has diuresed over 5 liters since admission, however BUN/Cr still climbing so decreased lasix to 80mg  daily 01/24/19.  Will continue to follow.  5. Anemia of CKD with some blood loss. Transfuse as needed. 6. Constipation-  started colace and miralax but may need lactulose.  Avoid mg citrate and fleets enemas due to AKI. Donetta Potts, MD [employer] [phone]

## 2019-01-26 NOTE — Progress Notes (Signed)
Went into responsibility to assign to RN, [employer], and system would not assign to RN.

## 2019-01-26 NOTE — Progress Notes (Signed)
ANTICOAGULATION CONSULT NOTE - Follow-Up Consult  Pharmacy Consult for Heparin  Indication: chest pain/ACS  No Known Allergies  Patient Measurements: Height: 5\' 10"  (177.8 cm) Weight: 239 lb 6.7 oz (108.6 kg) IBW/kg (Calculated) : 73 Heparin Dosing Weight: 97.6 kg  Vital Signs: Temp: 97.9 F (36.6 C) (10/14 0816) Temp Source: Oral (10/14 0816) BP: 145/79 (10/14 0957) Pulse Rate: 58 (10/14 0957)  Labs: Recent Labs    01/24/19 0323 01/25/19 0223 01/26/19 0255  HGB 9.6* 9.8* 9.1*  HCT 29.3* 28.0* 27.8*  PLT 166 165 181  HEPARINUNFRC 0.36 0.36 0.27*  CREATININE 5.42* 5.48* 4.97*    Estimated Creatinine Clearance: 20.7 mL/min (A) (by C-G formula based on SCr of 4.97 mg/dL (H)).   Medical History: Past Medical History:  Diagnosis Date  . Arthritis   . Asthma    as child  . Coronary artery disease    DES DIAG1 11/20/09 [facility])  . Diabetes mellitus without complication ([place])   . Diabetic osteomyelitis ([place])   . Diabetic ulcer of foot with muscle involvement without evidence of necrosis ([place])    DIABETIC ULCERATIONS ASSOCIATED WITH IRRITATION LATERAL ANKLE LEFT GREATER THAN RIGHT WITH MILD CELLULITIS   . Diverticulosis   . DKA (diabetic ketoacidoses) ([place]) 08/23/2017  . Edema, lower extremity   . Fatty liver   . GERD (gastroesophageal reflux disease)   . Gilbert's disease   . Glaucoma   . Heart disease 2011   Patient has stent for 80% blockage  . Hyperlipidemia   . Hypertension   . Neuropathy   . Seasonal allergies   . Shoulder pain   . Ulcer of foot ([place]) 08.06.2014   DIABETIC ULCERATIONS ASSOCIATED WITH IRRITATION LATERAL ANKLE LEFT GREATER THAN RIGHT WITH MILD CELLULITIS    Assessment: 1 YOM with known CAD (stent 2011), CKD, DM, HTN, HLD presented to [facility] ED with swelling, SOB, and chest pressure with pain to Left arm. Pt started on IV heparin. Cardiac cath showed severe LAD disease extending from proximal to mid vessel (unable to cross). Plans for CABG when  renal function improved. Off pump CABG was planned for 10/12 but was cancelled because Scr continues to worsen. Possible plan for CABG on 10/15 now that renal fxn is beginning to improve.  Heparin level dropped SUBtherapeutic this morning. CBC stable. No active bleeding or line issues per nursing. Will increase heparin infusion by ~2 units/kg/hr. No re-bolus. Since he has been therapeutic lately. Will not check a timed level this afternoon. F/u with next level with AM labs.  Goal of Therapy:  Heparin level 0.3-0.7 units/ml Monitor platelets by anticoagulation protocol: Yes   Plan:  -Increase heparin infusion to 1900 units/hr -Monitor daily heparin level and CBC -Monitor for s/sx of bleeding  Kennon Holter, PharmD PGY1 Ambulatory Care Pharmacy Resident [employer] Phone: [phone]

## 2019-01-27 ENCOUNTER — Inpatient Hospital Stay (HOSPITAL_COMMUNITY): Payer: BC Managed Care – PPO

## 2019-01-27 ENCOUNTER — Inpatient Hospital Stay (HOSPITAL_COMMUNITY)
Admission: EM | Disposition: A | Discharge status: Home / Self Care | Payer: Self-pay | Source: Home / Self Care | Attending: Cardiology

## 2019-01-27 ENCOUNTER — Inpatient Hospital Stay (HOSPITAL_COMMUNITY): Payer: BC Managed Care – PPO | Admitting: Anesthesiology

## 2019-01-27 ENCOUNTER — Telehealth: Payer: BC Managed Care – PPO

## 2019-01-27 ENCOUNTER — Encounter (HOSPITAL_COMMUNITY)
Admission: EM | Disposition: A | Discharge status: Home / Self Care | Payer: Self-pay | Source: Home / Self Care | Attending: Cardiology

## 2019-01-27 DIAGNOSIS — Z951 Presence of aortocoronary bypass graft: Secondary | ICD-10-CM

## 2019-01-27 DIAGNOSIS — I2510 Atherosclerotic heart disease of native coronary artery without angina pectoris: Secondary | ICD-10-CM

## 2019-01-27 HISTORY — PX: TEE WITHOUT CARDIOVERSION: SHX5443

## 2019-01-27 HISTORY — DX: Presence of aortocoronary bypass graft: Z95.1

## 2019-01-27 HISTORY — PX: CORONARY ARTERY BYPASS GRAFT: SHX141

## 2019-01-27 LAB — POCT I-STAT 7, (LYTES, BLD GAS, ICA,H+H)
Acid-base deficit: 1 mmol/L (ref 0.0–2.0)
Acid-base deficit: 1 mmol/L (ref 0.0–2.0)
Acid-base deficit: 3 mmol/L — ABNORMAL HIGH (ref 0.0–2.0)
Bicarbonate: 23.5 mmol/L (ref 20.0–28.0)
Bicarbonate: 24.2 mmol/L (ref 20.0–28.0)
Bicarbonate: 23.3 mmol/L (ref 20.0–28.0)
Calcium, Ion: 1.23 mmol/L (ref 1.15–1.40)
Calcium, Ion: 1.28 mmol/L (ref 1.15–1.40)
Calcium, Ion: 1.24 mmol/L (ref 1.15–1.40)
HCT: 26 % — ABNORMAL LOW (ref 39.0–52.0)
HCT: 24 % — ABNORMAL LOW (ref 39.0–52.0)
HCT: 27 % — ABNORMAL LOW (ref 39.0–52.0)
Hemoglobin: 8.8 g/dL — ABNORMAL LOW (ref 13.0–17.0)
Hemoglobin: 8.2 g/dL — ABNORMAL LOW (ref 13.0–17.0)
Hemoglobin: 9.2 g/dL — ABNORMAL LOW (ref 13.0–17.0)
O2 Saturation: 99 %
O2 Saturation: 98 %
O2 Saturation: 93 %
Patient temperature: 34.8
Patient temperature: 36.2
Patient temperature: 36.6
Potassium: 3.8 mmol/L (ref 3.5–5.1)
Potassium: 4.4 mmol/L (ref 3.5–5.1)
Potassium: 4.6 mmol/L (ref 3.5–5.1)
Sodium: 139 mmol/L (ref 135–145)
Sodium: 139 mmol/L (ref 135–145)
Sodium: 138 mmol/L (ref 135–145)
TCO2: 25 mmol/L (ref 22–32)
TCO2: 26 mmol/L (ref 22–32)
TCO2: 25 mmol/L (ref 22–32)
pCO2 arterial: 36.1 mmHg (ref 32.0–48.0)
pCO2 arterial: 42.9 mmHg (ref 32.0–48.0)
pCO2 arterial: 44 mmHg (ref 32.0–48.0)
pH, Arterial: 7.412 (ref 7.350–7.450)
pH, Arterial: 7.356 (ref 7.350–7.450)
pH, Arterial: 7.33 — ABNORMAL LOW (ref 7.350–7.450)
pO2, Arterial: 111 mmHg — ABNORMAL HIGH (ref 83.0–108.0)
pO2, Arterial: 102 mmHg (ref 83.0–108.0)
pO2, Arterial: 71 mmHg — ABNORMAL LOW (ref 83.0–108.0)

## 2019-01-27 LAB — CBC
HCT: 29.2 % — ABNORMAL LOW (ref 39.0–52.0)
HCT: 24.6 % — ABNORMAL LOW (ref 39.0–52.0)
HCT: 28.3 % — ABNORMAL LOW (ref 39.0–52.0)
Hemoglobin: 9.9 g/dL — ABNORMAL LOW (ref 13.0–17.0)
Hemoglobin: 8.3 g/dL — ABNORMAL LOW (ref 13.0–17.0)
Hemoglobin: 9.5 g/dL — ABNORMAL LOW (ref 13.0–17.0)
MCH: 30.2 pg (ref 26.0–34.0)
MCH: 30.4 pg (ref 26.0–34.0)
MCH: 30.4 pg (ref 26.0–34.0)
MCHC: 33.9 g/dL (ref 30.0–36.0)
MCHC: 33.7 g/dL (ref 30.0–36.0)
MCHC: 33.6 g/dL (ref 30.0–36.0)
MCV: 89 fL (ref 80.0–100.0)
MCV: 90.1 fL (ref 80.0–100.0)
MCV: 90.7 fL (ref 80.0–100.0)
Platelets: 184 10*3/uL (ref 150–400)
Platelets: 144 10*3/uL — ABNORMAL LOW (ref 150–400)
Platelets: 204 10*3/uL (ref 150–400)
RBC: 3.28 MIL/uL — ABNORMAL LOW (ref 4.22–5.81)
RBC: 2.73 MIL/uL — ABNORMAL LOW (ref 4.22–5.81)
RBC: 3.12 MIL/uL — ABNORMAL LOW (ref 4.22–5.81)
RDW: 13.6 % (ref 11.5–15.5)
RDW: 13.8 % (ref 11.5–15.5)
RDW: 14.1 % (ref 11.5–15.5)
WBC: 8.2 10*3/uL (ref 4.0–10.5)
WBC: 7.2 10*3/uL (ref 4.0–10.5)
WBC: 11.8 10*3/uL — ABNORMAL HIGH (ref 4.0–10.5)
nRBC: 0 % (ref 0.0–0.2)
nRBC: 0 % (ref 0.0–0.2)
nRBC: 0 % (ref 0.0–0.2)

## 2019-01-27 LAB — POCT I-STAT 4, (NA,K, GLUC, HGB,HCT)
Glucose, Bld: 110 mg/dL — ABNORMAL HIGH (ref 70–99)
HCT: 24 % — ABNORMAL LOW (ref 39.0–52.0)
Hemoglobin: 8.2 g/dL — ABNORMAL LOW (ref 13.0–17.0)
Potassium: 3.9 mmol/L (ref 3.5–5.1)
Sodium: 137 mmol/L (ref 135–145)

## 2019-01-27 LAB — GLUCOSE, CAPILLARY
Glucose-Capillary: 156 mg/dL — ABNORMAL HIGH (ref 70–99)
Glucose-Capillary: 110 mg/dL — ABNORMAL HIGH (ref 70–99)
Glucose-Capillary: 116 mg/dL — ABNORMAL HIGH (ref 70–99)
Glucose-Capillary: 104 mg/dL — ABNORMAL HIGH (ref 70–99)
Glucose-Capillary: 99 mg/dL (ref 70–99)
Glucose-Capillary: 125 mg/dL — ABNORMAL HIGH (ref 70–99)
Glucose-Capillary: 117 mg/dL — ABNORMAL HIGH (ref 70–99)
Glucose-Capillary: 104 mg/dL — ABNORMAL HIGH (ref 70–99)
Glucose-Capillary: 120 mg/dL — ABNORMAL HIGH (ref 70–99)
Glucose-Capillary: 148 mg/dL — ABNORMAL HIGH (ref 70–99)
Glucose-Capillary: 129 mg/dL — ABNORMAL HIGH (ref 70–99)

## 2019-01-27 LAB — RENAL FUNCTION PANEL
Albumin: 2.8 g/dL — ABNORMAL LOW (ref 3.5–5.0)
Anion gap: 9 (ref 5–15)
BUN: 43 mg/dL — ABNORMAL HIGH (ref 6–20)
CO2: 26 mmol/L (ref 22–32)
Calcium: 9 mg/dL (ref 8.9–10.3)
Chloride: 103 mmol/L (ref 98–111)
Creatinine, Ser: 4.45 mg/dL — ABNORMAL HIGH (ref 0.61–1.24)
GFR calc Af Amer: 16 mL/min — ABNORMAL LOW (ref >60)
GFR calc non Af Amer: 14 mL/min — ABNORMAL LOW (ref >60)
Glucose, Bld: 139 mg/dL — ABNORMAL HIGH (ref 70–99)
Phosphorus: 5 mg/dL — ABNORMAL HIGH (ref 2.5–4.6)
Potassium: 4.2 mmol/L (ref 3.5–5.1)
Sodium: 138 mmol/L (ref 135–145)

## 2019-01-27 LAB — BASIC METABOLIC PANEL
Anion gap: 10 (ref 5–15)
BUN: 39 mg/dL — ABNORMAL HIGH (ref 6–20)
CO2: 22 mmol/L (ref 22–32)
Calcium: 8.7 mg/dL — ABNORMAL LOW (ref 8.9–10.3)
Chloride: 105 mmol/L (ref 98–111)
Creatinine, Ser: 4.21 mg/dL — ABNORMAL HIGH (ref 0.61–1.24)
GFR calc Af Amer: 17 mL/min — ABNORMAL LOW (ref >60)
GFR calc non Af Amer: 15 mL/min — ABNORMAL LOW (ref >60)
Glucose, Bld: 120 mg/dL — ABNORMAL HIGH (ref 70–99)
Potassium: 4.7 mmol/L (ref 3.5–5.1)
Sodium: 137 mmol/L (ref 135–145)

## 2019-01-27 LAB — ECHO INTRAOPERATIVE TEE
Height: 70 in
Weight: 3830.71 oz

## 2019-01-27 LAB — HEPARIN LEVEL (UNFRACTIONATED): Heparin Unfractionated: 0.62 IU/mL (ref 0.30–0.70)

## 2019-01-27 LAB — APTT: aPTT: 33 seconds (ref 24–36)

## 2019-01-27 LAB — MAGNESIUM: Magnesium: 2.4 mg/dL (ref 1.7–2.4)

## 2019-01-27 LAB — PREPARE RBC (CROSSMATCH)

## 2019-01-27 LAB — PROTIME-INR
INR: 1.3 — ABNORMAL HIGH (ref 0.8–1.2)
Prothrombin Time: 16.4 seconds — ABNORMAL HIGH (ref 11.4–15.2)

## 2019-01-27 SURGERY — OFF PUMP CORONARY ARTERY BYPASS GRAFTING (CABG)
Anesthesia: General | Site: Chest

## 2019-01-27 SURGERY — OFF PUMP CORONARY ARTERY BYPASS GRAFTING (CABG)
Anesthesia: General | Site: Esophagus

## 2019-01-27 MED ORDER — LACTATED RINGERS IV SOLN
INTRAVENOUS | Status: DC | PRN
  Administered 2019-01-27: 08:00:00 via INTRAVENOUS

## 2019-01-27 MED ORDER — SODIUM CHLORIDE (PF) 0.9 % IJ SOLN
OROMUCOSAL | Status: DC | PRN
  Administered 2019-01-27 (×3): 4 mL via TOPICAL

## 2019-01-27 MED ORDER — ROCURONIUM BROMIDE 10 MG/ML (PF) SYRINGE
PREFILLED_SYRINGE | INTRAVENOUS | Status: DC | PRN
  Administered 2019-01-27: 50 mg via INTRAVENOUS
  Administered 2019-01-27: 100 mg via INTRAVENOUS

## 2019-01-27 MED ORDER — METOPROLOL TARTRATE 25 MG/10 ML ORAL SUSPENSION: 12.5000 mg | Freq: Two times a day (BID) | ORAL | Status: DC

## 2019-01-27 MED ORDER — ASPIRIN EC 325 MG PO TBEC
325.0000 mg | DELAYED_RELEASE_TABLET | Freq: Every day | ORAL | Status: DC
  Administered 2019-01-28 – 2019-01-31 (×4): 325 mg via ORAL
  Filled 2019-01-27 (×4): qty 1

## 2019-01-27 MED ORDER — PLASMA-LYTE 148 IV SOLN
INTRAVENOUS | Status: DC | PRN
  Administered 2019-01-27: 500 mL via INTRAVASCULAR

## 2019-01-27 MED ORDER — 0.9 % SODIUM CHLORIDE (POUR BTL) OPTIME
TOPICAL | Status: DC | PRN
  Administered 2019-01-27: 5000 mL

## 2019-01-27 MED ORDER — CHLORHEXIDINE GLUCONATE 0.12 % MT SOLN
15.0000 mL | OROMUCOSAL | Status: AC
  Administered 2019-01-27: 15 mL via OROMUCOSAL

## 2019-01-27 MED ORDER — SODIUM CHLORIDE 0.9 % IV SOLN: 250.0000 mL | INTRAVENOUS | Status: DC

## 2019-01-27 MED ORDER — MIDAZOLAM HCL 5 MG/5ML IJ SOLN
INTRAMUSCULAR | Status: DC | PRN
  Administered 2019-01-27: 1 mg via INTRAVENOUS
  Administered 2019-01-27: 3 mg via INTRAVENOUS
  Administered 2019-01-27: 2 mg via INTRAVENOUS

## 2019-01-27 MED ORDER — LACTATED RINGERS IV SOLN
INTRAVENOUS | Status: DC | PRN
  Administered 2019-01-27 (×2): via INTRAVENOUS

## 2019-01-27 MED ORDER — ASPIRIN 81 MG PO CHEW: 324.0000 mg | CHEWABLE_TABLET | Freq: Every day | ORAL | Status: DC

## 2019-01-27 MED ORDER — ALBUMIN HUMAN 5 % IV SOLN
INTRAVENOUS | Status: DC | PRN
  Administered 2019-01-27: 10:00:00 via INTRAVENOUS

## 2019-01-27 MED ORDER — TRAMADOL HCL 50 MG PO TABS
50.0000 mg | ORAL_TABLET | ORAL | Status: DC | PRN
  Administered 2019-01-29: 50 mg via ORAL
  Filled 2019-01-27: qty 1

## 2019-01-27 MED ORDER — POTASSIUM CHLORIDE 10 MEQ/50ML IV SOLN
10.0000 meq | INTRAVENOUS | Status: AC
  Administered 2019-01-27 (×3): 10 meq via INTRAVENOUS

## 2019-01-27 MED ORDER — LACTATED RINGERS IV SOLN: 500.0000 mL | Freq: Once | INTRAVENOUS | Status: DC | PRN

## 2019-01-27 MED ORDER — PROTAMINE SULFATE 10 MG/ML IV SOLN
INTRAVENOUS | Status: AC
  Filled 2019-01-27: qty 25

## 2019-01-27 MED ORDER — LACTATED RINGERS IV SOLN: INTRAVENOUS | Status: DC

## 2019-01-27 MED ORDER — NOREPINEPHRINE 4 MG/250ML-% IV SOLN
0.0000 ug/min | INTRAVENOUS | Status: DC
  Filled 2019-01-27: qty 250

## 2019-01-27 MED ORDER — ARTIFICIAL TEARS OPHTHALMIC OINT
TOPICAL_OINTMENT | OPHTHALMIC | Status: AC
  Filled 2019-01-27: qty 3.5

## 2019-01-27 MED ORDER — ACETAMINOPHEN 500 MG PO TABS
1000.0000 mg | ORAL_TABLET | Freq: Four times a day (QID) | ORAL | Status: DC
  Administered 2019-01-28 – 2019-01-31 (×12): 1000 mg via ORAL
  Filled 2019-01-27 (×13): qty 2

## 2019-01-27 MED ORDER — NITROGLYCERIN IN D5W 200-5 MCG/ML-% IV SOLN: 2.0000 ug/min | INTRAVENOUS | Status: DC

## 2019-01-27 MED ORDER — PROTAMINE SULFATE 10 MG/ML IV SOLN
INTRAVENOUS | Status: DC | PRN
  Administered 2019-01-27: 160 mg via INTRAVENOUS

## 2019-01-27 MED ORDER — METOPROLOL TARTRATE 12.5 MG HALF TABLET
12.5000 mg | ORAL_TABLET | Freq: Two times a day (BID) | ORAL | Status: DC
  Administered 2019-01-28: 12.5 mg via ORAL
  Filled 2019-01-27: qty 1

## 2019-01-27 MED ORDER — GABAPENTIN 300 MG PO CAPS
300.0000 mg | ORAL_CAPSULE | Freq: Every day | ORAL | Status: DC
  Administered 2019-01-28 – 2019-01-31 (×4): 300 mg via ORAL
  Filled 2019-01-27 (×4): qty 1

## 2019-01-27 MED ORDER — DOCUSATE SODIUM 100 MG PO CAPS
200.0000 mg | ORAL_CAPSULE | Freq: Every day | ORAL | Status: DC
  Administered 2019-01-28 – 2019-01-30 (×3): 200 mg via ORAL
  Filled 2019-01-27 (×4): qty 2

## 2019-01-27 MED ORDER — ACETAMINOPHEN 160 MG/5ML PO SOLN: 1000.0000 mg | Freq: Four times a day (QID) | ORAL | Status: DC

## 2019-01-27 MED ORDER — FENTANYL CITRATE (PF) 250 MCG/5ML IJ SOLN
INTRAMUSCULAR | Status: DC | PRN
  Administered 2019-01-27 (×3): 100 ug via INTRAVENOUS
  Administered 2019-01-27: 50 ug via INTRAVENOUS
  Administered 2019-01-27: 250 ug via INTRAVENOUS
  Administered 2019-01-27: 50 ug via INTRAVENOUS
  Administered 2019-01-27: 150 ug via INTRAVENOUS

## 2019-01-27 MED ORDER — SODIUM CHLORIDE 0.9 % IV SOLN
INTRAVENOUS | Status: DC
  Administered 2019-01-27: 12:00:00 via INTRAVENOUS

## 2019-01-27 MED ORDER — PROPOFOL 10 MG/ML IV BOLUS
INTRAVENOUS | Status: AC
  Filled 2019-01-27: qty 20

## 2019-01-27 MED ORDER — SODIUM CHLORIDE 0.9 % IV SOLN
1.5000 g | Freq: Two times a day (BID) | INTRAVENOUS | Status: DC
  Administered 2019-01-27: 1.5 g via INTRAVENOUS
  Filled 2019-01-27 (×2): qty 1.5

## 2019-01-27 MED ORDER — SODIUM CHLORIDE 0.9% FLUSH
3.0000 mL | Freq: Two times a day (BID) | INTRAVENOUS | Status: DC
  Administered 2019-01-28 – 2019-01-31 (×6): 3 mL via INTRAVENOUS

## 2019-01-27 MED ORDER — LIDOCAINE 2% (20 MG/ML) 5 ML SYRINGE
INTRAMUSCULAR | Status: AC
  Filled 2019-01-27: qty 5

## 2019-01-27 MED ORDER — ATORVASTATIN CALCIUM 80 MG PO TABS
80.0000 mg | ORAL_TABLET | Freq: Every day | ORAL | Status: DC
  Administered 2019-01-29 – 2019-01-30 (×2): 80 mg via ORAL
  Filled 2019-01-27 (×2): qty 1

## 2019-01-27 MED ORDER — DEXMEDETOMIDINE HCL IN NACL 400 MCG/100ML IV SOLN: 0.0000 ug/kg/h | INTRAVENOUS | Status: DC

## 2019-01-27 MED ORDER — METOPROLOL TARTRATE 5 MG/5ML IV SOLN
2.5000 mg | INTRAVENOUS | Status: DC | PRN
  Administered 2019-01-28: 5 mg via INTRAVENOUS
  Filled 2019-01-27: qty 5

## 2019-01-27 MED ORDER — PHENYLEPHRINE HCL-NACL 20-0.9 MG/250ML-% IV SOLN: 0.0000 ug/min | INTRAVENOUS | Status: DC

## 2019-01-27 MED ORDER — ALBUMIN HUMAN 5 % IV SOLN
250.0000 mL | INTRAVENOUS | Status: AC | PRN
  Administered 2019-01-27 (×2): 12.5 g via INTRAVENOUS

## 2019-01-27 MED ORDER — DOBUTAMINE IN D5W 4-5 MG/ML-% IV SOLN: 2.5000 ug/kg/min | INTRAVENOUS | Status: DC

## 2019-01-27 MED ORDER — FENTANYL CITRATE (PF) 250 MCG/5ML IJ SOLN
INTRAMUSCULAR | Status: AC
  Filled 2019-01-27: qty 30

## 2019-01-27 MED ORDER — MAGNESIUM SULFATE 4 GM/100ML IV SOLN
4.0000 g | Freq: Once | INTRAVENOUS | Status: DC
  Filled 2019-01-27: qty 100

## 2019-01-27 MED ORDER — ONDANSETRON HCL 4 MG/2ML IJ SOLN
4.0000 mg | Freq: Four times a day (QID) | INTRAMUSCULAR | Status: DC | PRN
  Administered 2019-01-27 – 2019-01-29 (×4): 4 mg via INTRAVENOUS
  Filled 2019-01-27 (×4): qty 2

## 2019-01-27 MED ORDER — FAMOTIDINE IN NACL 20-0.9 MG/50ML-% IV SOLN
20.0000 mg | Freq: Two times a day (BID) | INTRAVENOUS | Status: AC
  Administered 2019-01-27 (×2): 20 mg via INTRAVENOUS
  Filled 2019-01-27: qty 50

## 2019-01-27 MED ORDER — INSULIN REGULAR(HUMAN) IN NACL 100-0.9 UT/100ML-% IV SOLN: INTRAVENOUS | Status: DC

## 2019-01-27 MED ORDER — SODIUM CHLORIDE 0.9 % IV SOLN
1.5000 g | Freq: Two times a day (BID) | INTRAVENOUS | Status: DC
  Filled 2019-01-27: qty 1.5

## 2019-01-27 MED ORDER — NOREPINEPHRINE 4 MG/250ML-% IV SOLN: 0.0000 ug/min | INTRAVENOUS | Status: DC

## 2019-01-27 MED ORDER — HEPARIN SODIUM (PORCINE) 1000 UNIT/ML IJ SOLN
INTRAMUSCULAR | Status: DC | PRN
  Administered 2019-01-27: 16000 [IU] via INTRAVENOUS

## 2019-01-27 MED ORDER — BISACODYL 10 MG RE SUPP: 10.0000 mg | Freq: Every day | RECTAL | Status: DC

## 2019-01-27 MED ORDER — MIDAZOLAM HCL (PF) 10 MG/2ML IJ SOLN
INTRAMUSCULAR | Status: AC
  Filled 2019-01-27: qty 2

## 2019-01-27 MED ORDER — HEPARIN SODIUM (PORCINE) 1000 UNIT/ML IJ SOLN
INTRAMUSCULAR | Status: AC
  Filled 2019-01-27: qty 1

## 2019-01-27 MED ORDER — OXYCODONE HCL 5 MG PO TABS
5.0000 mg | ORAL_TABLET | ORAL | Status: DC | PRN
  Administered 2019-01-28 (×2): 10 mg via ORAL
  Filled 2019-01-27 (×2): qty 2

## 2019-01-27 MED ORDER — ACETAMINOPHEN 160 MG/5ML PO SOLN: 650.0000 mg | Freq: Once | ORAL | Status: AC

## 2019-01-27 MED ORDER — CHLORHEXIDINE GLUCONATE CLOTH 2 % EX PADS
6.0000 | MEDICATED_PAD | Freq: Every day | CUTANEOUS | Status: DC
  Administered 2019-01-27 – 2019-01-30 (×4): 6 via TOPICAL

## 2019-01-27 MED ORDER — PHENYLEPHRINE 40 MCG/ML (10ML) SYRINGE FOR IV PUSH (FOR BLOOD PRESSURE SUPPORT)
PREFILLED_SYRINGE | INTRAVENOUS | Status: AC
  Filled 2019-01-27: qty 10

## 2019-01-27 MED ORDER — BISACODYL 5 MG PO TBEC
10.0000 mg | DELAYED_RELEASE_TABLET | Freq: Every day | ORAL | Status: DC
  Administered 2019-01-28 – 2019-01-30 (×3): 10 mg via ORAL
  Filled 2019-01-27 (×4): qty 2

## 2019-01-27 MED ORDER — SUCCINYLCHOLINE CHLORIDE 200 MG/10ML IV SOSY
PREFILLED_SYRINGE | INTRAVENOUS | Status: AC
  Filled 2019-01-27: qty 10

## 2019-01-27 MED ORDER — ROCURONIUM BROMIDE 10 MG/ML (PF) SYRINGE
PREFILLED_SYRINGE | INTRAVENOUS | Status: AC
  Filled 2019-01-27: qty 10

## 2019-01-27 MED ORDER — SODIUM CHLORIDE 0.9% FLUSH: 3.0000 mL | INTRAVENOUS | Status: DC | PRN

## 2019-01-27 MED ORDER — NICARDIPINE HCL IN NACL 20-0.86 MG/200ML-% IV SOLN
3.0000 mg/h | INTRAVENOUS | Status: DC
  Administered 2019-01-27 (×2): 3 mg/h via INTRAVENOUS
  Administered 2019-01-28: 5 mg/h via INTRAVENOUS
  Administered 2019-01-28: 4 mg/h via INTRAVENOUS
  Administered 2019-01-28 (×2): 5 mg/h via INTRAVENOUS
  Administered 2019-01-28: 2 mg/h via INTRAVENOUS
  Administered 2019-01-28: 5 mg/h via INTRAVENOUS
  Filled 2019-01-27 (×9): qty 200

## 2019-01-27 MED ORDER — MIDAZOLAM HCL 2 MG/2ML IJ SOLN: 2.0000 mg | INTRAMUSCULAR | Status: DC | PRN

## 2019-01-27 MED ORDER — INSULIN REGULAR BOLUS VIA INFUSION
0.0000 [IU] | Freq: Three times a day (TID) | INTRAVENOUS | Status: DC
  Filled 2019-01-27: qty 10

## 2019-01-27 MED ORDER — EPHEDRINE 5 MG/ML INJ
INTRAVENOUS | Status: AC
  Filled 2019-01-27: qty 10

## 2019-01-27 MED ORDER — MORPHINE SULFATE (PF) 2 MG/ML IV SOLN
1.0000 mg | INTRAVENOUS | Status: DC | PRN
  Administered 2019-01-27: 2 mg via INTRAVENOUS
  Filled 2019-01-27: qty 1

## 2019-01-27 MED ORDER — SODIUM CHLORIDE 0.9 % IV SOLN
1.5000 g | INTRAVENOUS | Status: AC
  Administered 2019-01-28: 1.5 g via INTRAVENOUS
  Filled 2019-01-27: qty 1.5

## 2019-01-27 MED ORDER — PANTOPRAZOLE SODIUM 40 MG PO TBEC
40.0000 mg | DELAYED_RELEASE_TABLET | Freq: Every day | ORAL | Status: DC
  Administered 2019-01-29 – 2019-01-31 (×3): 40 mg via ORAL
  Filled 2019-01-27 (×3): qty 1

## 2019-01-27 MED ORDER — VANCOMYCIN HCL IN DEXTROSE 1-5 GM/200ML-% IV SOLN
1000.0000 mg | Freq: Once | INTRAVENOUS | Status: AC
  Administered 2019-01-27: 1000 mg via INTRAVENOUS
  Filled 2019-01-27: qty 200

## 2019-01-27 MED ORDER — PROPOFOL 10 MG/ML IV BOLUS
INTRAVENOUS | Status: DC | PRN
  Administered 2019-01-27: 80 mg via INTRAVENOUS

## 2019-01-27 MED ORDER — SODIUM CHLORIDE 0.45 % IV SOLN: INTRAVENOUS | Status: DC | PRN

## 2019-01-27 MED ORDER — ACETAMINOPHEN 650 MG RE SUPP
650.0000 mg | Freq: Once | RECTAL | Status: AC
  Administered 2019-01-27: 650 mg via RECTAL

## 2019-01-27 SURGICAL SUPPLY — 110 items
BAG DECANTER FOR FLEXI CONT (MISCELLANEOUS) ×6 IMPLANT
BASKET HEART (ORDER IN 25'S) (MISCELLANEOUS) ×1
BASKET HEART (ORDER IN 25S) (MISCELLANEOUS) ×2 IMPLANT
BLADE CLIPPER SURG (BLADE) ×3 IMPLANT
BLADE STERNUM SYSTEM 6 (BLADE) ×3 IMPLANT
BLADE SURG 11 STRL SS (BLADE) ×1 IMPLANT
BLOWER MISTER CAL-MED (MISCELLANEOUS) ×3 IMPLANT
BNDG ELASTIC 4X5.8 VLCR STR LF (GAUZE/BANDAGES/DRESSINGS) ×3 IMPLANT
BNDG ELASTIC 6X5.8 VLCR STR LF (GAUZE/BANDAGES/DRESSINGS) ×3 IMPLANT
BNDG GAUZE ELAST 4 BULKY (GAUZE/BANDAGES/DRESSINGS) ×3 IMPLANT
CABLE SURGICAL S-101-97-12 (CABLE) ×1 IMPLANT
CANISTER SUCT 3000ML PPV (MISCELLANEOUS) ×3 IMPLANT
CANNULA EZ GLIDE 8.0 24FR (CANNULA) ×2 IMPLANT
CANNULA MC2 2 STG 29/37 NON-V (CANNULA) ×2 IMPLANT
CANNULA MC2 TWO STAGE (CANNULA)
CANNULA VESSEL 3MM 2 BLNT TIP (CANNULA) ×1 IMPLANT
CATH CPB KIT HENDRICKSON (MISCELLANEOUS) ×2 IMPLANT
CLIP RETRACTION 3.0MM CORONARY (MISCELLANEOUS) ×3 IMPLANT
CLIP VESOCCLUDE MED 24/CT (CLIP) IMPLANT
CLIP VESOCCLUDE SM WIDE 24/CT (CLIP) IMPLANT
CONN ST 1/2X1/2  BEN (MISCELLANEOUS) ×1
CONN ST 1/2X1/2 BEN (MISCELLANEOUS) ×2 IMPLANT
CONNECTOR BLAKE 2:1 CARIO BLK (MISCELLANEOUS) ×1 IMPLANT
COVER WAND RF STERILE (DRAPES) ×2 IMPLANT
DEFOGGER ANTIFOG KIT (MISCELLANEOUS) ×1 IMPLANT
DRAIN CHANNEL 19F RND (DRAIN) ×2 IMPLANT
DRAPE CARDIOVASCULAR INCISE (DRAPES) ×3
DRAPE INCISE IOBAN 66X45 STRL (DRAPES) ×3 IMPLANT
DRAPE SLUSH/WARMER DISC (DRAPES) ×3 IMPLANT
DRAPE SRG 135X102X78XABS (DRAPES) ×2 IMPLANT
DRSG COVADERM 4X10 (GAUZE/BANDAGES/DRESSINGS) ×1 IMPLANT
DRSG COVADERM 4X14 (GAUZE/BANDAGES/DRESSINGS) ×3 IMPLANT
ELECT REM PT RETURN 9FT ADLT (ELECTROSURGICAL) ×6
ELECTRODE REM PT RTRN 9FT ADLT (ELECTROSURGICAL) ×4 IMPLANT
EVACUATOR SILICONE 100CC (DRAIN) ×2 IMPLANT
FELT TEFLON 1X6 (MISCELLANEOUS) ×5 IMPLANT
GAUZE SPONGE 4X4 12PLY STRL (GAUZE/BANDAGES/DRESSINGS) ×6 IMPLANT
GLOVE BIO SURGEON STRL SZ7 (GLOVE) ×6 IMPLANT
GLOVE BIO SURGEON STRL SZ7.5 (GLOVE) ×3 IMPLANT
GLOVE BIOGEL PI IND STRL 7.5 (GLOVE) ×4 IMPLANT
GLOVE BIOGEL PI IND STRL 8.5 (GLOVE) IMPLANT
GLOVE BIOGEL PI IND STRL 9 (GLOVE) IMPLANT
GLOVE BIOGEL PI INDICATOR 7.5 (GLOVE) ×2
GLOVE BIOGEL PI INDICATOR 8.5 (GLOVE) ×1
GLOVE BIOGEL PI INDICATOR 9 (GLOVE) ×1
GLOVE INDICATOR 7.5 STRL GRN (GLOVE) ×3 IMPLANT
GLOVE SURG SS PI 6.0 STRL IVOR (GLOVE) ×2 IMPLANT
GLOVE SURG SS PI 7.0 STRL IVOR (GLOVE) ×4 IMPLANT
GOWN STRL REUS W/ TWL LRG LVL3 (GOWN DISPOSABLE) ×8 IMPLANT
GOWN STRL REUS W/ TWL XL LVL3 (GOWN DISPOSABLE) ×4 IMPLANT
GOWN STRL REUS W/TWL LRG LVL3 (GOWN DISPOSABLE) ×18
GOWN STRL REUS W/TWL XL LVL3 (GOWN DISPOSABLE) ×6
HEMOSTAT POWDER SURGIFOAM 1G (HEMOSTASIS) ×9 IMPLANT
KIT BASIN OR (CUSTOM PROCEDURE TRAY) ×3 IMPLANT
KIT SUCTION CATH 14FR (SUCTIONS) ×9 IMPLANT
KIT TURNOVER KIT B (KITS) ×3 IMPLANT
KIT VASOVIEW HEMOPRO 2 VH 4000 (KITS) ×3 IMPLANT
LEAD PACING MYOCARDI (MISCELLANEOUS) ×3 IMPLANT
MARKER GRAFT CORONARY BYPASS (MISCELLANEOUS) ×8 IMPLANT
NS IRRIG 1000ML POUR BTL (IV SOLUTION) ×15 IMPLANT
OFFPUMP STABILIZER SUV (MISCELLANEOUS) ×3 IMPLANT
PACK E OPEN HEART (SUTURE) ×3 IMPLANT
PACK OPEN HEART (CUSTOM PROCEDURE TRAY) ×3 IMPLANT
PAD ARMBOARD 7.5X6 YLW CONV (MISCELLANEOUS) ×6 IMPLANT
PAD ELECT DEFIB RADIOL ZOLL (MISCELLANEOUS) ×3 IMPLANT
PENCIL BUTTON HOLSTER BLD 10FT (ELECTRODE) ×3 IMPLANT
POSITIONER ACROBAT-I OFFPUMP (MISCELLANEOUS) ×2 IMPLANT
POSITIONER HEAD DONUT 9IN (MISCELLANEOUS) ×3 IMPLANT
PUNCH AORTIC ROTATE 4.0MM (MISCELLANEOUS) ×1 IMPLANT
PUNCH AORTIC ROTATE 4.5MM 8IN (MISCELLANEOUS) IMPLANT
PUNCH AORTIC ROTATE 5MM 8IN (MISCELLANEOUS) IMPLANT
SHUNT CORONARY AXIUS 1.0 (MISCELLANEOUS) ×1 IMPLANT
SHUNT CORONARY AXIUS 1.25 (MISCELLANEOUS) ×1 IMPLANT
SPONGE LAP 18X18 RF (DISPOSABLE) IMPLANT
SPONGE LAP 4X18 RFD (DISPOSABLE) IMPLANT
SUT BONE WAX W31G (SUTURE) ×3 IMPLANT
SUT ETHIBOND X763 2 0 SH 1 (SUTURE) ×2 IMPLANT
SUT MNCRL AB 3-0 PS2 18 (SUTURE) ×6 IMPLANT
SUT MNCRL AB 4-0 PS2 18 (SUTURE) ×1 IMPLANT
SUT PDS AB 1 CTX 36 (SUTURE) ×6 IMPLANT
SUT PROLENE 2 0 SH DA (SUTURE) IMPLANT
SUT PROLENE 3 0 SH DA (SUTURE) ×3 IMPLANT
SUT PROLENE 3 0 SH1 36 (SUTURE) IMPLANT
SUT PROLENE 4 0 RB 1 (SUTURE)
SUT PROLENE 4 0 SH DA (SUTURE) IMPLANT
SUT PROLENE 4-0 RB1 .5 CRCL 36 (SUTURE) IMPLANT
SUT PROLENE 5 0 C 1 36 (SUTURE) ×10 IMPLANT
SUT PROLENE 6 0 C 1 30 (SUTURE) IMPLANT
SUT PROLENE 7 0 BV1 MDA (SUTURE) ×1 IMPLANT
SUT PROLENE 8 0 BV175 6 (SUTURE) IMPLANT
SUT PROLENE BLUE 7 0 (SUTURE) ×3 IMPLANT
SUT PROLENE POLY MONO (SUTURE) IMPLANT
SUT STEEL 6MS V (SUTURE) ×4 IMPLANT
SUT STEEL STERNAL CCS#1 18IN (SUTURE) ×2 IMPLANT
SUT STEEL SZ 6 DBL 3X14 BALL (SUTURE) ×2 IMPLANT
SUT VIC AB 2-0 CT1 27 (SUTURE) ×6
SUT VIC AB 2-0 CT1 TAPERPNT 27 (SUTURE) IMPLANT
SYSTEM HEARTSTRING SEAL 3.8 (VASCULAR PRODUCTS) ×2 IMPLANT
SYSTEM HEARTSTRING SEAL 3.8MM (VASCULAR PRODUCTS)
SYSTEM HEARTSTRING SEAL 4.3 (VASCULAR PRODUCTS) ×2 IMPLANT
SYSTEM HEARTSTRING SEAL 4.3MM (VASCULAR PRODUCTS)
SYSTEM SAHARA CHEST DRAIN ATS (WOUND CARE) ×3 IMPLANT
TAPE CLOTH SURG 4X10 WHT LF (GAUZE/BANDAGES/DRESSINGS) ×2 IMPLANT
TAPES RETRACTO (MISCELLANEOUS) ×6 IMPLANT
TOWEL GREEN STERILE (TOWEL DISPOSABLE) ×3 IMPLANT
TOWEL GREEN STERILE FF (TOWEL DISPOSABLE) ×3 IMPLANT
TRAY FOLEY SLVR 16FR TEMP STAT (SET/KITS/TRAYS/PACK) ×3 IMPLANT
TUBING LAP HI FLOW INSUFFLATIO (TUBING) ×3 IMPLANT
UNDERPAD 30X30 (UNDERPADS AND DIAPERS) ×3 IMPLANT
WATER STERILE IRR 1000ML POUR (IV SOLUTION) ×6 IMPLANT

## 2019-01-27 NOTE — Progress Notes (Signed)
Patient ID: Chase Clark, male   DOB: 1965/10/31, 53 y.o.   MRN: [id number] EVENING ROUNDS NOTE :     [address] [address]       [place] [address]             [phone]                 Day of Surgery Procedure(s) (LRB): OFF PUMP CORONARY ARTERY BYPASS GRAFTING (CABG) X 2 WITH ENDOSCOPIC HARVESTING OF RIGHT GREATER SAPHENOUS VEIN. LIMA TO LAD (N/A) TRANSESOPHAGEAL ECHOCARDIOGRAM (TEE) (N/A)  Total Length of Stay:  LOS: 8 days  BP 97/70   Pulse 73   Temp 98.1 F (36.7 C)   Resp 10   Ht 5\' 10"  (1.778 m)   Wt 108.6 kg   SpO2 95%   BMI 34.35 kg/m   .Intake/Output      10/15 0701 - 10/16 0700   I.V. (mL/kg) 2872.3 (26.4)   IV Piggyback 645.4   Total Intake(mL/kg) 3517.7 (32.4)   Urine (mL/kg/hr) 1380 (1.1)   Emesis/NG output 0   Other 1550   Blood 150   Chest Tube 230   Total Output 3310   Net +207.7         . sodium chloride    . [START ON 01/28/2019] sodium chloride    . sodium chloride 10 mL/hr at 01/27/19 1205  . albumin human 12.5 g (01/27/19 1353)  . cefUROXime (ZINACEF)  IV    . dexmedetomidine (PRECEDEX) IV infusion Stopped (01/27/19 1434)  . DOBUTamine    . famotidine (PEPCID) IV Stopped (01/27/19 1227)  . insulin 0.6 mL/hr at 01/27/19 1900  . lactated ringers    . lactated ringers    . lactated ringers 20 mL/hr at 01/27/19 1900  . magnesium sulfate    . niCARDipine 3 mg/hr (01/27/19 1617)  . nitroGLYCERIN 30 mcg/min (01/27/19 1900)  . norepinephrine (LEVOPHED) Adult infusion    . phenylephrine (NEO-SYNEPHRINE) Adult infusion Stopped (01/27/19 1404)  . vancomycin       Lab Results  Component Value Date   WBC 11.8 (H) 01/27/2019   HGB 9.5 (L) 01/27/2019   HCT 28.3 (L) 01/27/2019   PLT 204 01/27/2019   GLUCOSE 110 (H) 01/27/2019   CHOL 145 01/19/2019   TRIG 151 (H) 01/19/2019   HDL 43 01/19/2019   LDLDIRECT 130.0 12/17/2018   LDLCALC 72 01/19/2019   ALT 14 01/20/2019   AST 18 01/20/2019   NA 138 01/27/2019   K 4.6 01/27/2019    CL 103 01/27/2019   CREATININE 4.45 (H) 01/27/2019   BUN 43 (H) 01/27/2019   CO2 26 01/27/2019   TSH 3.13 12/02/2017   PSA 0.16 05/04/2017   INR 1.3 (H) 01/27/2019   HGBA1C 9.3 (H) 01/19/2019   MICROALBUR 13.5 (H) 01/06/2014   Uop, 30 hour Now extubated  Not bleeding K 4.6   Grace Isaac MD  [facility] [phone] Office [phone] 01/27/2019 7:06 PM

## 2019-01-27 NOTE — Anesthesia Procedure Notes (Signed)
Arterial Line Insertion Performed by: Lowella Dell, CRNA, CRNA  Preanesthetic checklist: patient identified, IV checked, site marked, risks and benefits discussed, surgical consent, monitors and equipment checked, pre-op evaluation, timeout performed and anesthesia consent Lidocaine 1% used for infiltration and patient sedated Left, radial was placed Catheter size: 20 G Hand hygiene performed  and maximum sterile barriers used   Attempts: 2 Procedure performed without using ultrasound guided technique. Following insertion, dressing applied and Biopatch. Post procedure assessment: normal  Patient tolerated the procedure well with no immediate complications. Additional procedure comments: Placed by [employer].Marland Kitchen

## 2019-01-27 NOTE — Brief Op Note (Signed)
01/19/2019 - 01/27/2019  11:03 AM  PATIENT:  Chase Clark  53 y.o. male  PRE-OPERATIVE DIAGNOSIS:  cad  POST-OPERATIVE DIAGNOSIS:  cad  PROCEDURE:  Procedure(s): OFF PUMP CORONARY ARTERY BYPASS GRAFTING (CABG) X 2 WITH ENDOSCOPIC HARVESTING OF RIGHT GREATER SAPHENOUS VEIN. LIMA TO LAD (N/A) TRANSESOPHAGEAL ECHOCARDIOGRAM (TEE) (N/A)  LIMA to LAD SVG to Diag1  SURGEON:  Surgeon(s) and Role:    * Lightfoot, Lucile Crater, MD - Primary  PHYSICIAN ASSISTANT:  Nicholes Rough, PA-C   ANESTHESIA:   general  EBL:  150 mL   BLOOD ADMINISTERED:none  DRAINS: routine   LOCAL MEDICATIONS USED:  NONE  SPECIMEN:  No Specimen  DISPOSITION OF SPECIMEN:  N/A  COUNTS:  YES  DICTATION: .Dragon Dictation  PLAN OF CARE: Admit to inpatient   PATIENT DISPOSITION:  ICU - intubated and hemodynamically stable.   Delay start of Pharmacological VTE agent (>24hrs) due to surgical blood loss or risk of bleeding: yes

## 2019-01-27 NOTE — OR Nursing (Signed)
1046 2 heart charge nurse called and given 45 min. ETA.

## 2019-01-27 NOTE — Op Note (Signed)
     [address]       [place] [address]             [phone]                                         01/27/2019    Patient:  Chase Clark Pre-Op Dx: CAD, stable angina, DM, chronic renal insufficiency   Post-op Dx:  same Procedure: Off pump CABG X 2.  LIMA LAD, RSVG D1 Endoscopic greater saphenous vein harvest on the right Intra-operative Transesophageal Echocardiogram  Surgeon and Role:      * Lightfoot, Lucile Crater, MD - Primary    * T. Harriet Pho, PA-C - assisting   Anesthesia  general EBL:  245ml Blood Administration: none  Drains: 27 F blake drain: L, mediastinal  Wires: none Counts: correct   Indications: 53 yo male with history of DM, and HTN, presented to the ED with left shoulder and arm pain.  He was ruled in for STEMI, and taken to the cath lab, where he was noted to have LAD disease.  He was taken back to the cath today for PCI, but due to tortuosity and calcifications, wires failed to cross the lesion.  CTS has been consulted to assist with management.   Findings: Good conduit, good targets.  Operative Technique: After the risks, benefits and alternatives were thoroughly discussed, the patient was brought to the operative theatre.  All invasive lines were placed in pre-op holding.  Anesthesia was induced, and the patient was prepped and draped in normal sterile fashion.  An appropriate surgical pause was performed, and pre-operative antibiotics were dosed accordingly.  We began with simultaneous incisions were made along the right leg for harvesting of the greater saphenous vein and the chest for the sternotomy.  In regards to the sternotomy, this was carried down with bovie cautery, and the sternum was divided with a reciprocating saw.  Meticulous hemostasis was obtained.  The left internal thoracic artery was exposed and harvested in in pedicled fashion.  The patient was systemically heparinized, and the artery was divided distally, and  placed in a papaverine sponge.    The sternal elevator was removed, and a retractor was placed.  The pericardium was divided in the midline and fashioned into a cradle with pericardial stitches.   We exposed the anterior wall of the heart and identified a good target on the 1st diagonal branch.  The heart was stabilized, and an arteriotomy was created.  The vein was anastomosed using 7'0 proline in an end to side fashion.  Next, we exposed a good target on the LAD, and fashioned an end to side anastomosis between it and the LITA.  Meticulous hemostasis was obtained.    A partial occludding clamp was then placed on the ascending aorta, and we created an end to side anastomosis between it and the proximal vein graft.  The proximal site was marked with a ring.  Hemostasis was obtained, and the heparin was reversed with protamine.  Chest tubes and wires were placed, and the sternum was re-approximated with with sternal wires.  The soft tissue and skin were re-approximated wth absorbable suture.    The patient tolerated the procedure without any immediate complications, and was transferred to the ICU in guarded condition.  [name]

## 2019-01-27 NOTE — Anesthesia Postprocedure Evaluation (Signed)
Anesthesia Post Note  Patient: Chase Clark  Procedure(s) Performed: OFF PUMP CORONARY ARTERY BYPASS GRAFTING (CABG) X 2 WITH ENDOSCOPIC HARVESTING OF RIGHT GREATER SAPHENOUS VEIN. LIMA TO LAD (N/A Chest) TRANSESOPHAGEAL ECHOCARDIOGRAM (TEE) (N/A Esophagus)     Patient location during evaluation: ICU Anesthesia Type: General Level of consciousness: sedated and patient remains intubated per anesthesia plan Pain management: pain level controlled Vital Signs Assessment: post-procedure vital signs reviewed and stable Respiratory status: patient remains intubated per anesthesia plan Cardiovascular status: stable Postop Assessment: no apparent nausea or vomiting Anesthetic complications: no    Last Vitals:  Vitals:   01/27/19 1300 01/27/19 1400  BP: 100/65 107/67  Pulse: (!) 50 (!) 51  Resp: 12 13  Temp: (!) 34.8 C (!) 35.2 C  SpO2: 100% 100%    Last Pain:  Vitals:   01/27/19 1200  TempSrc: Core  PainSc:                  Chase Clark

## 2019-01-27 NOTE — Anesthesia Procedure Notes (Signed)
Central Venous Catheter Insertion Performed by: Nolon Nations, MD, anesthesiologist Start/End10/15/2020 6:45 AM, 01/27/2019 7:05 AM Patient location: Pre-op. Preanesthetic checklist: patient identified, IV checked, site marked, risks and benefits discussed, surgical consent, monitors and equipment checked, pre-op evaluation, timeout performed and anesthesia consent Lidocaine 1% used for infiltration and patient sedated Hand hygiene performed  and maximum sterile barriers used  Catheter size: 8.5 Fr Sheath introducer PA Cath depth:45 Procedure performed using ultrasound guided technique. Ultrasound Notes:anatomy identified, needle tip was noted to be adjacent to the nerve/plexus identified, no ultrasound evidence of intravascular and/or intraneural injection and image(s) printed for medical record Attempts: 1 Following insertion, line sutured, dressing applied and Biopatch. Post procedure assessment: blood return through all ports, free fluid flow and no air  Patient tolerated the procedure well with no immediate complications.

## 2019-01-27 NOTE — Anesthesia Procedure Notes (Signed)
Central Venous Catheter Insertion Performed by: Nolon Nations, MD, anesthesiologist Start/End10/15/2020 7:00 AM, 01/27/2019 7:05 AM Patient location: Pre-op. Preanesthetic checklist: patient identified, IV checked, site marked, risks and benefits discussed, surgical consent, monitors and equipment checked, pre-op evaluation, timeout performed and anesthesia consent Hand hygiene performed  and maximum sterile barriers used  PA cath was placed.Swan type:thermodilution PA Cath depth:45 Procedure performed without using ultrasound guided technique. Attempts: 1 Post procedure assessment: no air, free fluid flow and blood return through all ports  Patient tolerated the procedure well with no immediate complications.

## 2019-01-27 NOTE — Transfer of Care (Signed)
Immediate Anesthesia Transfer of Care Note  Patient: Chase Clark  Procedure(s) Performed: OFF PUMP CORONARY ARTERY BYPASS GRAFTING (CABG) X 2 WITH ENDOSCOPIC HARVESTING OF RIGHT GREATER SAPHENOUS VEIN. LIMA TO LAD (N/A Chest) TRANSESOPHAGEAL ECHOCARDIOGRAM (TEE) (N/A Esophagus)  Patient Location: SICU  Anesthesia Type:General  Level of Consciousness: sedated and Patient remains intubated per anesthesia plan  Airway & Oxygen Therapy: Patient remains intubated per anesthesia plan and Patient placed on Ventilator (see vital sign flow sheet for setting)  Post-op Assessment: Report given to RN and Post -op Vital signs reviewed and stable  Post vital signs: Reviewed and stable  Last Vitals:  Vitals Value Taken Time  BP    Temp    Pulse 49 01/27/19 1148  Resp 12 01/27/19 1148  SpO2 100 % 01/27/19 1148  Vitals shown include unvalidated device data.  Last Pain:  Vitals:   01/27/19 0318  TempSrc: Oral  PainSc: 0-No pain         Complications: No apparent anesthesia complications

## 2019-01-27 NOTE — Progress Notes (Signed)
  Echocardiogram Echocardiogram Transesophageal has been performed.  Burnett Kanaris 01/27/2019, 9:05 AM

## 2019-01-27 NOTE — OR Nursing (Signed)
1112 2 HEART CHARGE NURSE CALLED AND GIVEN 20 MIN. ETA.

## 2019-01-27 NOTE — Progress Notes (Signed)
     [address]       [place] [address]             [phone]       No events. Creat continues to trend down  Vitals:   01/26/19 2319 01/27/19 0318  BP: 129/60 114/65  Pulse: 63 (!) 57  Resp: 14 13  Temp: 98.1 F (36.7 C) 97.8 F (36.6 C)  SpO2: 100% 100%   Alert NAD Sinus, regular EWOB  53 yo male with LAD disease OR today off pump CABG 2  Harrell O Lightfoot

## 2019-01-27 NOTE — Progress Notes (Signed)
S: Pt s/p CABG x2 without complications O:BP (!) 01/74 (BP Location: Left Arm)   Pulse (!) 49   Temp (!) 94.6 F (34.8 C) (Core)   Resp 12   Ht 5\' 10"  (1.778 m)   Wt 108.6 kg   SpO2 100%   BMI 34.35 kg/m   Intake/Output Summary (Last 24 hours) at 01/27/2019 1309 Last data filed at 01/27/2019 1200 Gross per 24 hour  Intake 3057.36 ml  Output 2720 ml  Net 337.36 ml   Intake/Output: I/O last 3 completed shifts: In: 674 [I.V.:674] Out: -   Intake/Output this shift:  Total I/O In: 2679.4 [I.V.:2429.4; IV Piggyback:250] Out: 2720 [Urine:1010; Other:1550; Blood:150; Chest Tube:10] Weight change:  Pt not seen as he had been in OR   Recent Labs  Lab 01/21/19 0233 01/22/19 0223 01/23/19 0228 01/24/19 0323 01/25/19 0223 01/26/19 0255 01/27/19 0554  NA 136 136 136 135 135 136 138  K 4.0 4.2 4.2 4.2 4.3 4.3 4.2  CL 101 102 102 101 99 103 103  CO2 24 24 24 24 23 24 26   GLUCOSE 189* 208* 165* 244* 132* 199* 139*  BUN 28* 30* 36* 41* 44* 43* 43*  CREATININE 2.78* 3.15* 4.35* 5.42* 5.48* 4.97* 4.45*  ALBUMIN  --  2.5* 2.4* 2.7* 2.6* 2.4* 2.8*  CALCIUM 8.6* 8.6* 8.4* 8.9 8.9 8.7* 9.0  PHOS  --  4.8* 5.1* 5.3* 5.2* 5.1* 5.0*   Liver Function Tests: Recent Labs  Lab 01/25/19 0223 01/26/19 0255 01/27/19 0554  ALBUMIN 2.6* 2.4* 2.8*   No results for input(s): LIPASE, AMYLASE in the last 168 hours. No results for input(s): AMMONIA in the last 168 hours. CBC: Recent Labs  Lab 01/24/19 0323 01/25/19 0223 01/26/19 0255 01/27/19 0554 01/27/19 1153  WBC 7.2 9.1 7.2 8.2 7.2  HGB 9.6* 9.8* 9.1* 9.9* 8.3*  HCT 29.3* 28.0* 27.8* 29.2* 24.6*  MCV 88.8 87.5 90.0 89.0 90.1  PLT 166 165 181 184 144*   Cardiac Enzymes: No results for input(s): CKTOTAL, CKMB, CKMBINDEX, TROPONINI in the last 168 hours. CBG: Recent Labs  Lab 01/26/19 0623 01/26/19 1121 01/26/19 1655 01/26/19 2146 01/27/19 0523  GLUCAP 177* 222* 187* 255* 156*    [place] Studies: No results for input(s):  IRON, TIBC, TRANSFERRIN, FERRITIN in the last 72 hours. Studies/Results: Dg Chest Port 1 View  Result Date: 01/27/2019 CLINICAL DATA:  Post CABG EXAM: PORTABLE CHEST 1 VIEW COMPARISON:  01/19/2019 FINDINGS: Endotracheal tube is 3 cm above the carina. Swan-Ganz catheter tip in the main pulmonary artery. Left basilar chest tube. No visible pneumothorax. NG tube is in the proximal stomach with the side port near the GE junction. Changes of CABG. Left base and upper lobe atelectasis. Mild vascular congestion. IMPRESSION: Status post CABG. Support devices in expected position. No pneumothorax. Areas of atelectasis in the left lung. Electronically Signed   By: Rolm Baptise M.D.   On: 01/27/2019 12:25   . [START ON 01/28/2019] acetaminophen  1,000 mg Oral Q6H   Or  . [START ON 01/28/2019] acetaminophen (TYLENOL) oral liquid 160 mg/5 mL  1,000 mg Per Tube Q6H  . acetaminophen (TYLENOL) oral liquid 160 mg/5 mL  650 mg Per Tube Once   Or  . acetaminophen  650 mg Rectal Once  . [START ON 01/28/2019] aspirin EC  325 mg Oral Daily   Or  . [START ON 01/28/2019] aspirin  324 mg Per Tube Daily  . [START ON 01/28/2019] atorvastatin  80 mg Oral q1800  . [  START ON 01/28/2019] bisacodyl  10 mg Oral Daily   Or  . [START ON 01/28/2019] bisacodyl  10 mg Rectal Daily  . Chlorhexidine Gluconate Cloth  6 each Topical Daily  . [START ON 01/28/2019] docusate sodium  200 mg Oral Daily  . [START ON 01/28/2019] gabapentin  300 mg Oral QPC breakfast  . insulin regular  0-10 Units Intravenous TID WC  . metoprolol tartrate  12.5 mg Oral BID   Or  . metoprolol tartrate  12.5 mg Per Tube BID  . [START ON 01/29/2019] pantoprazole  40 mg Oral Daily  . [START ON 01/28/2019] sodium chloride flush  3 mL Intravenous Q12H    BMET    Component Value Date/Time   NA 138 01/27/2019 0554   K 4.2 01/27/2019 0554   K 4.5 12/29/2013 1521   CL 103 01/27/2019 0554   CO2 26 01/27/2019 0554   GLUCOSE 139 (H) 01/27/2019 0554   BUN  43 (H) 01/27/2019 0554   CREATININE 4.45 (H) 01/27/2019 0554   CREATININE 2.42 (H) 12/31/2018 1454   CALCIUM 9.0 01/27/2019 0554   GFRNONAA 14 (L) 01/27/2019 0554   GFRAA 16 (L) 01/27/2019 0554   CBC    Component Value Date/Time   WBC 7.2 01/27/2019 1153   RBC 2.73 (L) 01/27/2019 1153   HGB 8.3 (L) 01/27/2019 1153   HCT 24.6 (L) 01/27/2019 1153   HCT 24.9 (L) 09/04/2017 2358   PLT 144 (L) 01/27/2019 1153   MCV 90.1 01/27/2019 1153   MCH 30.4 01/27/2019 1153   MCHC 33.7 01/27/2019 1153   RDW 13.8 01/27/2019 1153   LYMPHSABS 1.7 09/15/2017 0532   MONOABS 0.6 09/15/2017 0532   EOSABS 0.5 09/15/2017 0532   BASOSABS 0.1 09/15/2017 0532     Assessment/Plan:  1. 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 No indication for dialysis at this time and will continue to follow along closely 1. Cr appears to have reached a plateau and continues to improve. 2. Continue to follow after surgery 2. ACS- s/p cath with severe LAD and failed attempt for PCI now for CABG when renal function stabilizes/improves. Currently without chest pain. 1. S/p CABG x 2 (LIMA-LAD, RSVG-D1) 01/27/19 3. HTN/volume- variable but stable.  4. CHF- good UOP and has diuresed over 5 liters since admission, however BUN/Cr still climbing so decreasedlasix to 80mg  daily 01/24/19. Will continue to follow. 5. Anemia of CKD with some blood loss. Transfuse as needed. 6. Constipation- startedcolace and miralax but may need lactulose. Avoid mg citrate and fleets enemas due to AKI.  Donetta Potts, MD [employer] [phone]

## 2019-01-27 NOTE — Anesthesia Procedure Notes (Signed)
Procedure Name: Intubation Date/Time: 01/27/2019 8:05 AM Performed by: Lowella Dell, CRNA Pre-anesthesia Checklist: Patient identified, Emergency Drugs available, Suction available and Patient being monitored Patient Re-evaluated:Patient Re-evaluated prior to induction Oxygen Delivery Method: Circle System Utilized Preoxygenation: Pre-oxygenation with 100% oxygen Induction Type: IV induction Ventilation: Mask ventilation without difficulty and Oral airway inserted - appropriate to patient size Laryngoscope Size: Mac and 4 Tube type: Oral Tube size: 8.0 mm Number of attempts: 1 Airway Equipment and Method: Stylet Placement Confirmation: ETT inserted through vocal cords under direct vision,  positive ETCO2 and breath sounds checked- equal and bilateral Secured at: 22 cm Tube secured with: Tape Dental Injury: Teeth and Oropharynx as per pre-operative assessment

## 2019-01-28 ENCOUNTER — Inpatient Hospital Stay (HOSPITAL_COMMUNITY): Payer: BC Managed Care – PPO

## 2019-01-28 ENCOUNTER — Encounter (HOSPITAL_COMMUNITY): Payer: Self-pay | Admitting: Thoracic Surgery (Cardiothoracic Vascular Surgery)

## 2019-01-28 DIAGNOSIS — Z951 Presence of aortocoronary bypass graft: Secondary | ICD-10-CM

## 2019-01-28 LAB — GLUCOSE, CAPILLARY
Glucose-Capillary: 100 mg/dL — ABNORMAL HIGH (ref 70–99)
Glucose-Capillary: 105 mg/dL — ABNORMAL HIGH (ref 70–99)
Glucose-Capillary: 106 mg/dL — ABNORMAL HIGH (ref 70–99)
Glucose-Capillary: 107 mg/dL — ABNORMAL HIGH (ref 70–99)
Glucose-Capillary: 107 mg/dL — ABNORMAL HIGH (ref 70–99)
Glucose-Capillary: 107 mg/dL — ABNORMAL HIGH (ref 70–99)
Glucose-Capillary: 108 mg/dL — ABNORMAL HIGH (ref 70–99)
Glucose-Capillary: 108 mg/dL — ABNORMAL HIGH (ref 70–99)
Glucose-Capillary: 109 mg/dL — ABNORMAL HIGH (ref 70–99)
Glucose-Capillary: 110 mg/dL — ABNORMAL HIGH (ref 70–99)
Glucose-Capillary: 110 mg/dL — ABNORMAL HIGH (ref 70–99)
Glucose-Capillary: 111 mg/dL — ABNORMAL HIGH (ref 70–99)
Glucose-Capillary: 111 mg/dL — ABNORMAL HIGH (ref 70–99)
Glucose-Capillary: 112 mg/dL — ABNORMAL HIGH (ref 70–99)
Glucose-Capillary: 116 mg/dL — ABNORMAL HIGH (ref 70–99)
Glucose-Capillary: 121 mg/dL — ABNORMAL HIGH (ref 70–99)
Glucose-Capillary: 121 mg/dL — ABNORMAL HIGH (ref 70–99)
Glucose-Capillary: 122 mg/dL — ABNORMAL HIGH (ref 70–99)
Glucose-Capillary: 126 mg/dL — ABNORMAL HIGH (ref 70–99)
Glucose-Capillary: 97 mg/dL (ref 70–99)

## 2019-01-28 LAB — CBC
HCT: 26.8 % — ABNORMAL LOW (ref 39.0–52.0)
HCT: 27.3 % — ABNORMAL LOW (ref 39.0–52.0)
Hemoglobin: 8.4 g/dL — ABNORMAL LOW (ref 13.0–17.0)
Hemoglobin: 9.2 g/dL — ABNORMAL LOW (ref 13.0–17.0)
MCH: 29.4 pg (ref 26.0–34.0)
MCH: 30.9 pg (ref 26.0–34.0)
MCHC: 31.3 g/dL (ref 30.0–36.0)
MCHC: 33.7 g/dL (ref 30.0–36.0)
MCV: 93.7 fL (ref 80.0–100.0)
MCV: 91.6 fL (ref 80.0–100.0)
Platelets: 175 10*3/uL (ref 150–400)
Platelets: 190 10*3/uL (ref 150–400)
RBC: 2.86 MIL/uL — ABNORMAL LOW (ref 4.22–5.81)
RBC: 2.98 MIL/uL — ABNORMAL LOW (ref 4.22–5.81)
RDW: 14.1 % (ref 11.5–15.5)
RDW: 14.2 % (ref 11.5–15.5)
WBC: 11.2 10*3/uL — ABNORMAL HIGH (ref 4.0–10.5)
WBC: 13.8 10*3/uL — ABNORMAL HIGH (ref 4.0–10.5)
nRBC: 0 % (ref 0.0–0.2)
nRBC: 0 % (ref 0.0–0.2)

## 2019-01-28 LAB — BASIC METABOLIC PANEL
Anion gap: 8 (ref 5–15)
Anion gap: 10 (ref 5–15)
BUN: 43 mg/dL — ABNORMAL HIGH (ref 6–20)
BUN: 45 mg/dL — ABNORMAL HIGH (ref 6–20)
CO2: 21 mmol/L — ABNORMAL LOW (ref 22–32)
CO2: 19 mmol/L — ABNORMAL LOW (ref 22–32)
Calcium: 8.3 mg/dL — ABNORMAL LOW (ref 8.9–10.3)
Calcium: 8.4 mg/dL — ABNORMAL LOW (ref 8.9–10.3)
Chloride: 107 mmol/L (ref 98–111)
Chloride: 105 mmol/L (ref 98–111)
Creatinine, Ser: 4.46 mg/dL — ABNORMAL HIGH (ref 0.61–1.24)
Creatinine, Ser: 4.27 mg/dL — ABNORMAL HIGH (ref 0.61–1.24)
GFR calc Af Amer: 16 mL/min — ABNORMAL LOW (ref >60)
GFR calc Af Amer: 17 mL/min — ABNORMAL LOW (ref >60)
GFR calc non Af Amer: 14 mL/min — ABNORMAL LOW (ref >60)
GFR calc non Af Amer: 15 mL/min — ABNORMAL LOW (ref >60)
Glucose, Bld: 102 mg/dL — ABNORMAL HIGH (ref 70–99)
Glucose, Bld: 132 mg/dL — ABNORMAL HIGH (ref 70–99)
Potassium: 4.9 mmol/L (ref 3.5–5.1)
Potassium: 4.7 mmol/L (ref 3.5–5.1)
Sodium: 136 mmol/L (ref 135–145)
Sodium: 134 mmol/L — ABNORMAL LOW (ref 135–145)

## 2019-01-28 LAB — MAGNESIUM
Magnesium: 2.4 mg/dL (ref 1.7–2.4)
Magnesium: 2.5 mg/dL — ABNORMAL HIGH (ref 1.7–2.4)

## 2019-01-28 MED ORDER — METOCLOPRAMIDE HCL 5 MG/ML IJ SOLN
10.0000 mg | Freq: Four times a day (QID) | INTRAMUSCULAR | Status: DC
  Administered 2019-01-28 – 2019-01-29 (×3): 10 mg via INTRAVENOUS
  Filled 2019-01-28 (×3): qty 2

## 2019-01-28 MED ORDER — METOPROLOL TARTRATE 12.5 MG HALF TABLET
12.5000 mg | ORAL_TABLET | Freq: Once | ORAL | Status: AC
  Administered 2019-01-28: 12.5 mg via ORAL
  Filled 2019-01-28: qty 1

## 2019-01-28 MED ORDER — ENOXAPARIN SODIUM 30 MG/0.3ML ~~LOC~~ SOLN
30.0000 mg | Freq: Every day | SUBCUTANEOUS | Status: DC
  Administered 2019-01-28 – 2019-01-30 (×3): 30 mg via SUBCUTANEOUS
  Filled 2019-01-28 (×3): qty 0.3

## 2019-01-28 MED ORDER — INSULIN GLARGINE 100 UNIT/ML ~~LOC~~ SOLN
20.0000 [IU] | Freq: Every day | SUBCUTANEOUS | Status: DC
  Administered 2019-01-28 – 2019-01-31 (×4): 20 [IU] via SUBCUTANEOUS
  Filled 2019-01-28 (×5): qty 0.2

## 2019-01-28 MED ORDER — METOPROLOL TARTRATE 25 MG PO TABS
25.0000 mg | ORAL_TABLET | Freq: Two times a day (BID) | ORAL | Status: DC
  Administered 2019-01-28 – 2019-01-31 (×6): 25 mg via ORAL
  Filled 2019-01-28 (×6): qty 1

## 2019-01-28 MED ORDER — FUROSEMIDE 10 MG/ML IJ SOLN
40.0000 mg | Freq: Once | INTRAMUSCULAR | Status: AC
  Administered 2019-01-28: 40 mg via INTRAVENOUS
  Filled 2019-01-28: qty 4

## 2019-01-28 MED ORDER — INSULIN ASPART 100 UNIT/ML ~~LOC~~ SOLN
0.0000 [IU] | SUBCUTANEOUS | Status: DC
  Administered 2019-01-28 – 2019-01-29 (×3): 2 [IU] via SUBCUTANEOUS
  Administered 2019-01-29: 4 [IU] via SUBCUTANEOUS
  Administered 2019-01-29: 2 [IU] via SUBCUTANEOUS

## 2019-01-28 MED FILL — Cefuroxime Sodium For Inj 750 MG: INTRAMUSCULAR | Qty: 750 | Status: AC

## 2019-01-28 MED FILL — Potassium Chloride Inj 2 mEq/ML: INTRAVENOUS | Qty: 40 | Status: AC

## 2019-01-28 MED FILL — Sodium Chloride IV Soln 0.9%: INTRAVENOUS | Qty: 2000 | Status: AC

## 2019-01-28 MED FILL — Heparin Sodium (Porcine) Inj 1000 Unit/ML: INTRAMUSCULAR | Qty: 30 | Status: AC

## 2019-01-28 MED FILL — Magnesium Sulfate Inj 50%: INTRAMUSCULAR | Qty: 10 | Status: AC

## 2019-01-28 NOTE — Progress Notes (Signed)
CT surgery p.m. Rounds  Patient nauseated, started on Reglan in addition to Zofran P.m. creatinine stable, 1 dose IV Lasix ordered for low urine output P.m. hemoglobin 9.2 Blood pressure stable, sinus rhythm Mobilize tomorrow after nausea improves

## 2019-01-28 NOTE — Progress Notes (Signed)
[address]       [place] [address]             [phone]                 1 Day Post-Op Procedure(s) (LRB): OFF PUMP CORONARY ARTERY BYPASS GRAFTING (CABG) X 2 WITH ENDOSCOPIC HARVESTING OF RIGHT GREATER SAPHENOUS VEIN. LIMA TO LAD (N/A) TRANSESOPHAGEAL ECHOCARDIOGRAM (TEE) (N/A)   Events: Complains of some blurred vision in the left eye.  Has had it before, just more noticeable today _______________________________________________________________ Vitals: BP (!) 144/59   Pulse 74   Temp 99.3 F (37.4 C)   Resp 12   Ht 5\' 10"  (1.778 m)   Wt 106.4 kg   SpO2 99%   BMI 33.66 kg/m   - Neuro: alert NAD  - Cardiovascular: sinus  Drips: cardene.   PAP: (25-46)/(14-24) 27/14 CO:  [3.4 L/min-6.6 L/min] 6.5 L/min CI:  [1.5 L/min/m2-2.9 L/min/m2] 2.9 L/min/m2  - Pulm: EWOB  ABG    Component Value Date/Time   PHART 7.330 (L) 01/27/2019 1715   PCO2ART 44.0 01/27/2019 1715   PO2ART 71.0 (L) 01/27/2019 1715   HCO3 23.3 01/27/2019 1715   TCO2 25 01/27/2019 1715   ACIDBASEDEF 3.0 (H) 01/27/2019 1715   O2SAT 93.0 01/27/2019 1715    - Abd: soft, NT - Extremity: + edema  .Intake/Output      10/15 0701 - 10/16 0700 10/16 0701 - 10/17 0700   I.V. (mL/kg) 3154.5 (29.6) 41.9 (0.4)   IV Piggyback 995.5    Total Intake(mL/kg) 4150 (39) 41.9 (0.4)   Urine (mL/kg/hr) 1750 (0.7) 100 (0.3)   Emesis/NG output 0    Other 1550    Blood 150    Chest Tube 380 20   Total Output 3830 120   Net +320 -78.1           _______________________________________________________________ Labs: CBC Latest Ref Rng & Units 01/28/2019 01/27/2019 01/27/2019  WBC 4.0 - 10.5 K/uL 11.2(H) 11.8(H) -  Hemoglobin 13.0 - 17.0 g/dL 8.4(L) 9.5(L) 9.2(L)  Hematocrit 39.0 - 52.0 % 26.8(L) 28.3(L) 27.0(L)  Platelets 150 - 400 K/uL 175 204 -   CMP Latest Ref Rng & Units 01/28/2019 01/27/2019 01/27/2019  Glucose 70 - 99 mg/dL 102(H) 120(H) -  BUN 6 - 20 mg/dL 43(H) 39(H) -   Creatinine 0.61 - 1.24 mg/dL 4.46(H) 4.21(H) -  Sodium 135 - 145 mmol/L 136 137 138  Potassium 3.5 - 5.1 mmol/L 4.9 4.7 4.6  Chloride 98 - 111 mmol/L 107 105 -  CO2 22 - 32 mmol/L 21(L) 22 -  Calcium 8.9 - 10.3 mg/dL 8.3(L) 8.7(L) -  Total Protein 6.5 - 8.1 g/dL - - -  Total Bilirubin 0.3 - 1.2 mg/dL - - -  Alkaline Phos 38 - 126 U/L - - -  AST 15 - 41 U/L - - -  ALT 0 - 44 U/L - - -    CXR: Small left effusion  _______________________________________________________________  Assessment and Plan: POD 1 s/p Off CABG 2.    Neuro: will follow visual disturbance.  Doesn't appear to be a corneal abrasion.  Likely acute on chronic issue. CV: stable.  On A/S/BB.  Will increase metop and wean cardene.  Will remove a line and swan Pulm: aggressive pulm toilet Renal: creat slightly up, but lower than peak.  Will hold off on diuresis today GI: advancing diet Heme: stable ID: afebrile.   Endo: SSI  Dispo: continue  ICU care, floor later today.  Melodie Bouillon, MD 01/28/2019 9:57 AM

## 2019-01-28 NOTE — Discharge Summary (Addendum)
[address]       [place] [address]             [phone]      Physician Discharge Summary  Patient ID: Chase Clark MRN: [id number] DOB/AGE: [birth year] 53 y.o.  Admit date: 01/19/2019 Discharge date: 01/31/2019  Admission Diagnoses:  Patient Active Problem List   Diagnosis Date Noted  . AKI (acute kidney injury) ([place])   . Chest pain 01/19/2019  . Nephrotic syndrome 01/19/2019  . Unstable angina ([place]) 01/19/2019  . Allergic rhinitis 07/19/2018  . Abnormality of gait 12/02/2017  . Labile blood pressure   . Labile blood glucose   . Amputation of left lower extremity below knee ([place]) 09/09/2017  . Benign prostatic hyperplasia   . Hyperlipidemia   . Stage 3 chronic kidney disease   . Neuropathic pain   . Osteomyelitis of left foot ([place])   . Coronary artery disease involving native coronary artery of native heart without angina pectoris   . Anemia of chronic disease   . Leukocytosis   . Pressure injury of skin 08/24/2017  . Cervical spondylosis with myelopathy and radiculopathy 05/07/2017  . Renal failure 04/30/2017  . Fatty liver 04/04/2017  . DDD (degenerative disc disease), cervical 01/13/2017  . PAD (peripheral artery disease) ([place]) 08/07/2016  . Ulcer of ankle ([place]) 08/07/2016  . Chronic diarrhea 05/17/2016  . Hyponatremia 05/17/2016  . Anemia 01/12/2014  . Disorder of ligament of ankle 07/31/2013  . Edema 06/28/2013  . 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 Obesity (BMI 30-39.9) 06/12/2013  . Pain in joint, shoulder region 06/12/2013  . Statin intolerance 06/10/2013  . DM (diabetes mellitus), type 2, uncontrolled w/ophthalmic complication ([place]) 61/60/7371  . Hyperlipidemia associated with type 2 diabetes mellitus ([place]) 07/17/2006  . GILBERT'S SYNDROME 07/17/2006  . Essential hypertension 07/17/2006  . Coronary atherosclerosis 07/17/2006  . OSTEOARTHRITIS 07/17/2006  . COUGH 07/17/2006    Discharge Diagnoses:   Principal Problem:   Unstable angina ([place]) Active Problems:   DM (diabetes mellitus), type 2, uncontrolled w/ophthalmic complication ([place])   Hyperlipidemia associated with type 2 diabetes mellitus ([place])   Essential hypertension   Coronary atherosclerosis   Renal failure   Chest pain   Nephrotic syndrome   AKI (acute kidney injury) ([place])   S/P CABG x 2   Discharged Condition: good  HPI:   53 yo male with history of DM, and HTN, presented to the ED with left shoulder and arm pain.  He was ruled in for STEMI, and taken to the cath lab, where he was noted to have LAD disease.  He was taken back to the cath today for PCI, but due to tortuosity and calcifications, wires failed to cross the lesion.  CTS has been consulted to assist with management.     Hospital Course:    On 01/27/2019 Mr. Wallen underwent a CABG x 2 off pump with Dr. Kipp Brood. He tolerated the procedure well and was transferred to the surgical ICU for continued care. He was extubated in a timely manner. POD 1  He was having some blurred vision in the left eye. This is likely a chronic issue. We removed his arterial line and swan ganz catheter. We held diuresis since his creatinine was slighter higher than his baseline. We consulted Nephrology for CKD. We continued to advance his diet. We encouraged aggressive pulm toilet. POD 2 we discharged his chest tubes and lines. We mobilized the patient. He was  progressing nicely.  Transfer orders were placed but due to a lack of beds the patient remained in the CVICU. POD 3 he had some urinary retention therefore we started flomax. He remained in NSR. Again, he remained in the ICU due to lack of beds on our stepdown unit. Today, he is ambulating with limited assistance, tolerating room air, his incisions were healing well, and he was ready for discharge home.    Consults: nephrology  Significant Diagnostic Studies:   Failed PCI of mid LAD due to failure to cross with wire because  of tortuosity and calcification.   Left Anterior Descending  Vessel is small.  Prox LAD lesion 70% stenosed  Prox LAD lesion is 70% stenosed.  Prox LAD to Mid LAD lesion 45% stenosed  Prox LAD to Mid LAD lesion is 45% stenosed.  Mid LAD lesion 90% stenosed  Mid LAD lesion is 90% stenosed.  First Diagonal Branch  1st Diag lesion 0% stenosed  Non-stenotic 1st Diag lesion was previously treated.  Second Septal Branch  Vessel is small in size.  Right Coronary Artery  Acute Marginal Branch  Acute Mrg lesion 60% stenosed  Acute Mrg lesion is 60% stenosed.  Intervention  No interventions have been documented. Coronary Diagrams  Diagnostic Dominance: Right  Intervention  Implants     RECOMMENDATION:   Sequential off-pump LIMA to LAD diagonal if possible.  High risk for kidney failure requiring dialysis either as a result of contrast used today, 90 cc or certainly after bypass surgery.  Treatments:    Patient:  [name] Pre-Op Dx: CAD, stable angina, DM, chronic renal insufficiency   Post-op Dx:  same Procedure: Off pump CABG X 2.  LIMA LAD, RSVG D1 Endoscopic greater saphenous vein harvest on the right Intra-operative Transesophageal Echocardiogram  Surgeon and Role:      * Lightfoot, Lucile Crater, MD - Primary    * T. Harriet Pho, PA-C - assisting   Anesthesia  general EBL:  228ml Blood Administration: none  Drains: 67 F blake drain: L, mediastinal  Wires: none Counts: correct   Indications: 53 yo male with history of DM, and HTN, presented to the ED with left shoulder and arm pain. He was ruled in for STEMI, and taken to the cath lab, where he was noted to have LAD disease. He was taken back to the cath today for PCI, but due to tortuosity and calcifications, wires failed to cross the lesion. CTS has been consulted to assist with management.   Findings: Good conduit, good targets.  Discharge Exam: Blood pressure (!) 147/86, pulse 72,  temperature 98.7 F (37.1 C), temperature source Oral, resp. rate 14, height 5\' 10"  (1.778 m), weight 103.1 kg, SpO2 96 %.   GEN:No acute distress.   Cardiac:RRR, no murmurs, rubs, or gallops.  Respiratory:Clear to auscultation bilaterally. Skin: Surgical incision is clean MS:No edema; No deformity. Neuro:Nonfocal  Psych: Normal affect   Disposition: Discharge disposition: 01-Home or Self Care       Discharge Instructions    Amb Referral to Cardiac Rehabilitation   Complete by: As directed    Diagnosis: CABG   CABG X ___: 2   After initial evaluation and assessments completed: Virtual Based Care may be provided alone or in conjunction with Phase 2 Cardiac Rehab based on patient barriers.: Yes   For home use only DME 3 n 1   Complete by: As directed    For home use only DME Walker rolling   Complete by: As  directed    Patient needs a walker to treat with the following condition: Debility     Allergies as of 01/31/2019   No Known Allergies     Medication List    STOP taking these medications   amLODipine 10 MG tablet Commonly known as: NORVASC   hydrALAZINE 50 MG tablet Commonly known as: APRESOLINE   meclizine 12.5 MG tablet Commonly known as: ANTIVERT   rosuvastatin 10 MG tablet Commonly known as: CRESTOR     TAKE these medications   acetaminophen 325 MG tablet Commonly known as: TYLENOL Take 1 tablet (325 mg total) by mouth every 6 (six) hours as needed for mild pain (pain score 1-3 or temp > 100.5).   aspirin 325 MG EC tablet Take 1 tablet (325 mg total) by mouth daily. Start taking on: February 01, 2019 What changed:   medication strength  how much to take   atorvastatin 80 MG tablet Commonly known as: LIPITOR Take 1 tablet (80 mg total) by mouth daily at 6 PM.   Combigan 0.2-0.5 % ophthalmic solution Generic drug: brimonidine-timolol Place 1 drop into the right eye every 12 (twelve) hours.   Dexcom G6 Receiver Devi 1 each by Does not  apply route as directed. Use to check blood glucose 5 times daily   Dexcom G6 Sensor Misc 1 each by Does not apply route as directed. Place 1 sensor subcutaneously once every 10 days.   Dexcom G6 Transmitter Misc 1 each by Does not apply route as directed. Use to check blood glucose 5 times daily. Refill every 3 months.   Durezol 0.05 % Emul Generic drug: Difluprednate Place 1 drop into the left eye at bedtime.   esomeprazole 20 MG capsule Commonly known as: NEXIUM Take 20 mg by mouth daily.   furosemide 40 MG tablet Commonly known as: Lasix Take 1 tablet (40 mg total) by mouth daily for 5 days.   gabapentin 300 MG capsule Commonly known as: NEURONTIN Take 1 tab twice a day What changed: how much to take   insulin aspart 100 UNIT/ML injection Commonly known as: novoLOG Inject up to 15 units up to 3 times daily with meals   Insulin Glargine 100 UNIT/ML Solostar Pen Commonly known as: LANTUS Inject 40 Units into the skin daily. What changed:   how much to take  when to take this   Insulin Pen Needle 32G X 4 MM Misc Inject 5 pens into the skin daily. Use to inject Lantus and Novolog up to 5 times daily   latanoprost 0.005 % ophthalmic solution Commonly known as: XALATAN Place 1 drop into both eyes at bedtime.   metoprolol tartrate 25 MG tablet Commonly known as: LOPRESSOR Take 1 tablet (25 mg total) by mouth 2 (two) times daily. What changed:   medication strength  how much to take   oxyCODONE 5 MG immediate release tablet Commonly known as: Oxy IR/ROXICODONE Take 1 tablet (5 mg total) by mouth every 6 (six) hours as needed for severe pain.   Potassium Chloride ER 20 MEQ Tbcr Take 20 mEq by mouth daily for 5 days.   tamsulosin 0.4 MG Caps capsule Commonly known as: FLOMAX Take 1 capsule (0.4 mg total) by mouth daily. Start taking on: February 01, 2019            Durable Medical Equipment  (From admission, onward)         Start     Ordered    01/31/19 0000  01/31/19 1226   01/31/19 0000  For home use only DME 3 n 1     01/31/19 1226         Follow-up Information    [name], MD. Call in 1 day(s).   Specialty: Family Medicine Contact information: [place] [address] [phone]        Lajuana Matte, MD Follow up.   Specialty: Thoracic Surgery Why: Your follow-up appointment is on 02/04/2019 at 11:45am. Please bring your hospital paperwork. Contact information: [place] [address] [phone]        Teodoro Spray, MD Follow up.   Specialty: Cardiology Why: Please call and make an appointment within 2 weeks of discharge. Thank you.  Contact information: [place] [address] [phone]          The patient has been discharged on:   1.Beta Blocker:  Yes [ yes  ]                              No   [   ]                              If No, reason:  2.Ace Inhibitor/ARB: Yes [   ]                                     No  [ no   ]                                     If No, reason: CKD  3.Statin:   Yes [  yes ]                  No  [   ]                  If No, reason:  4.[place]:  Yes  [place]   ]                  No   [   ]                  If No, reason:   Signed: Elgie Collard 01/31/2019, 3:21 PM

## 2019-01-28 NOTE — Progress Notes (Addendum)
Patient ID: Chase Clark, male   DOB: 1965/08/08, 53 y.o.   MRN: [id number] S: Feeling well just "soreness in chest".  Tolerated CABG x 2 O:BP (!) 144/59   Pulse 74   Temp 99.3 F (37.4 C)   Resp 12   Ht 5\' 10"  (1.778 m)   Wt 106.4 kg   SpO2 99%   BMI 33.66 kg/m   Intake/Output Summary (Last 24 hours) at 01/28/2019 1014 Last data filed at 01/28/2019 0900 Gross per 24 hour  Intake 3941.9 ml  Output 2950 ml  Net 991.9 ml   Intake/Output: I/O last 3 completed shifts: In: 6440 [I.V.:3382.5; IV Piggyback:995.5] Out: 3474 [Urine:1750; Other:1550; Blood:150; Chest Tube:380]  Intake/Output this shift:  Total I/O In: 41.9 [I.V.:41.9] Out: 120 [Urine:100; Chest Tube:20] Weight change:  Gen: NAD CVS: no rub Resp: cta Abd: +BS, soft, NT Ext: 1+ edema, s/p LBKA  Recent Labs  Lab 01/22/19 0223 01/23/19 0228 01/24/19 0323 01/25/19 0223 01/26/19 0255 01/27/19 0554 01/27/19 1154 01/27/19 1227 01/27/19 1623 01/27/19 1715 01/27/19 1822 01/28/19 0300  NA 136 136 135 135 136 138 137 139 139 138  137 136  K 4.2 4.2 4.2 4.3 4.3 4.2 3.9 3.8 4.4 4.6 4.7 4.9  CL 102 102 101 99 103 103  --   --   --   --  105 107  CO2 24 24 24 23 24 26   --   --   --   --  22 21*  GLUCOSE 208* 165* 244* 132* 199* 139* 110*  --   --   --  120* 102*  BUN 30* 36* 41* 44* 43* 43*  --   --   --   --  39* 43*  CREATININE 3.15* 4.35* 5.42* 5.48* 4.97* 4.45*  --   --   --   --  4.21* 4.46*  ALBUMIN 2.5* 2.4* 2.7* 2.6* 2.4* 2.8*  --   --   --   --   --   --   CALCIUM 8.6* 8.4* 8.9 8.9 8.7* 9.0  --   --   --   --  8.7* 8.3*  PHOS 4.8* 5.1* 5.3* 5.2* 5.1* 5.0*  --   --   --   --   --   --    Liver Function Tests: Recent Labs  Lab 01/25/19 0223 01/26/19 0255 01/27/19 0554  ALBUMIN 2.6* 2.4* 2.8*   No results for input(s): LIPASE, AMYLASE in the last 168 hours. No results for input(s): AMMONIA in the last 168 hours. CBC: Recent Labs  Lab 01/26/19 0255 01/27/19 0554 01/27/19 1153  01/27/19 1715  01/27/19 1822 01/28/19 0300  WBC 7.2 8.2 7.2  --   --  11.8* 11.2*  HGB 9.1* 9.9* 8.3*   < > 9.2* 9.5* 8.4*  HCT 27.8* 29.2* 24.6*   < > 27.0* 28.3* 26.8*  MCV 90.0 89.0 90.1  --   --  90.7 93.7  PLT 181 184 144*  --   --  204 175   < > = values in this interval not displayed.   Cardiac Enzymes: No results for input(s): CKTOTAL, CKMB, CKMBINDEX, TROPONINI in the last 168 hours. CBG: Recent Labs  Lab 01/28/19 0609 01/28/19 0648 01/28/19 0754 01/28/19 0825 01/28/19 0847  GLUCAP 108* 105* 112* 100* 107*    Iron Studies: No results for input(s): IRON, TIBC, TRANSFERRIN, FERRITIN in the last 72 hours. Studies/Results: Dg Chest Port 1 View  Result Date: 01/28/2019 CLINICAL DATA:  Chest tube  after cardiac surgery EXAM: PORTABLE CHEST 1 VIEW COMPARISON:  Yesterday FINDINGS: Extubation of the esophagus and trachea. Swan-Ganz catheter from right IJ approach with tip at the right main pulmonary artery. Chest tubes are present. Mild atelectasis. Stable mild cardiomegaly. CABG. No visible pneumothorax. IMPRESSION: 1. Stable remaining hardware. 2. Mild atelectasis.  No visible pneumothorax. Electronically Signed   By: Monte Fantasia M.D.   On: 01/28/2019 07:07   Dg Chest Port 1 View  Result Date: 01/27/2019 CLINICAL DATA:  Post CABG EXAM: PORTABLE CHEST 1 VIEW COMPARISON:  01/19/2019 FINDINGS: Endotracheal tube is 3 cm above the carina. Swan-Ganz catheter tip in the main pulmonary artery. Left basilar chest tube. No visible pneumothorax. NG tube is in the proximal stomach with the side port near the GE junction. Changes of CABG. Left base and upper lobe atelectasis. Mild vascular congestion. IMPRESSION: Status post CABG. Support devices in expected position. No pneumothorax. Areas of atelectasis in the left lung. Electronically Signed   By: Rolm Baptise M.D.   On: 01/27/2019 12:25   . acetaminophen  1,000 mg Oral Q6H   Or  . acetaminophen (TYLENOL) oral liquid 160 mg/5 mL  1,000 mg Per Tube  Q6H  . aspirin EC  325 mg Oral Daily   Or  . aspirin  324 mg Per Tube Daily  . atorvastatin  80 mg Oral q1800  . bisacodyl  10 mg Oral Daily   Or  . bisacodyl  10 mg Rectal Daily  . Chlorhexidine Gluconate Cloth  6 each Topical Daily  . docusate sodium  200 mg Oral Daily  . enoxaparin (LOVENOX) injection  30 mg Subcutaneous QHS  . gabapentin  300 mg Oral QPC breakfast  . insulin aspart  0-24 Units Subcutaneous Q4H  . insulin regular  0-10 Units Intravenous TID WC  . metoprolol tartrate  12.5 mg Oral Once  . metoprolol tartrate  25 mg Oral BID  . [START ON 01/29/2019] pantoprazole  40 mg Oral Daily  . sodium chloride flush  3 mL Intravenous Q12H    BMET    Component Value Date/Time   NA 136 01/28/2019 0300   K 4.9 01/28/2019 0300   K 4.5 12/29/2013 1521   CL 107 01/28/2019 0300   CO2 21 (L) 01/28/2019 0300   GLUCOSE 102 (H) 01/28/2019 0300   BUN 43 (H) 01/28/2019 0300   CREATININE 4.46 (H) 01/28/2019 0300   CREATININE 2.42 (H) 12/31/2018 1454   CALCIUM 8.3 (L) 01/28/2019 0300   GFRNONAA 14 (L) 01/28/2019 0300   GFRAA 16 (L) 01/28/2019 0300   CBC    Component Value Date/Time   WBC 11.2 (H) 01/28/2019 0300   RBC 2.86 (L) 01/28/2019 0300   HGB 8.4 (L) 01/28/2019 0300   HCT 26.8 (L) 01/28/2019 0300   HCT 24.9 (L) 09/04/2017 2358   PLT 175 01/28/2019 0300   MCV 93.7 01/28/2019 0300   MCH 29.4 01/28/2019 0300   MCHC 31.3 01/28/2019 0300   RDW 14.1 01/28/2019 0300   LYMPHSABS 1.7 09/15/2017 0532   MONOABS 0.6 09/15/2017 0532   EOSABS 0.5 09/15/2017 0532   BASOSABS 0.1 09/15/2017 0532    Assessment/Plan:  1. 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  1. Cr appears to have reached a plateau andcontinues to remain stable.  Up from nadir of 4.2 but good UOP. 2. No indication  for dialysis at  this time 2. ACS- s/p cath with severe LAD and failed attempt for PCI now for CABG when renal function stabilizes/improves. Currently without chest pain. 1. S/p CABG x 2 (LIMA-LAD, RSVG-D1) 01/27/19 3. HTN/volume- variable but stable.  4. CHF- good UOP and lasix on hold after surgery.  Will continue to follow. 5. Anemia of CKD with some blood loss. Transfuse as needed. 6. Constipation- startedcolace and miralax but may need lactulose. Avoid mg citrate and fleets enemas due to AKI.  Donetta Potts, MD [employer] [phone]

## 2019-01-28 NOTE — Progress Notes (Addendum)
Progress Note  Patient Name: Chase Clark Date of Encounter: 01/28/2019  Primary Cardiologist: No primary care provider on file.   Subjective   He is doing well postop and denies chest pain or shortness of breath.  He does however report of blurry vision and seeing flashes of lights though he states that he is legally blind in the right eye.  He currently denies headaches, nausea, vomiting or any focal neurological weakness.  Inpatient Medications    Scheduled Meds: . acetaminophen  1,000 mg Oral Q6H   Or  . acetaminophen (TYLENOL) oral liquid 160 mg/5 mL  1,000 mg Per Tube Q6H  . aspirin EC  325 mg Oral Daily   Or  . aspirin  324 mg Per Tube Daily  . atorvastatin  80 mg Oral q1800  . bisacodyl  10 mg Oral Daily   Or  . bisacodyl  10 mg Rectal Daily  . Chlorhexidine Gluconate Cloth  6 each Topical Daily  . docusate sodium  200 mg Oral Daily  . gabapentin  300 mg Oral QPC breakfast  . insulin regular  0-10 Units Intravenous TID WC  . metoprolol tartrate  12.5 mg Oral BID   Or  . metoprolol tartrate  12.5 mg Per Tube BID  . [START ON 01/29/2019] pantoprazole  40 mg Oral Daily  . sodium chloride flush  3 mL Intravenous Q12H   Continuous Infusions: . sodium chloride    . sodium chloride    . sodium chloride 10 mL/hr at 01/27/19 1205  . albumin human 12.5 g (01/27/19 1353)  . cefUROXime (ZINACEF)  IV    . dexmedetomidine (PRECEDEX) IV infusion Stopped (01/27/19 1434)  . DOBUTamine    . insulin 1 mL/hr at 01/28/19 0600  . lactated ringers    . lactated ringers    . lactated ringers 20 mL/hr at 01/28/19 0600  . magnesium sulfate    . niCARDipine 5 mg/hr (01/28/19 0612)  . nitroGLYCERIN Stopped (01/27/19 2152)  . norepinephrine (LEVOPHED) Adult infusion    . phenylephrine (NEO-SYNEPHRINE) Adult infusion Stopped (01/27/19 1404)   PRN Meds: sodium chloride, albumin human, lactated ringers, metoprolol tartrate, midazolam, morphine injection, ondansetron (ZOFRAN) IV,  oxyCODONE, sodium chloride flush, traMADol   Vital Signs    Vitals:   01/28/19 0300 01/28/19 0400 01/28/19 0500 01/28/19 0600  BP: 103/73 109/76  124/75  Pulse: 68 70  77  Resp: 13 13 (!) 24 (!) 7  Temp: 99.3 F (37.4 C) 99.1 F (37.3 C) 99.5 F (37.5 C) 99.5 F (37.5 C)  TempSrc:  Core    SpO2: 98% 98%  98%  Weight:   106.4 kg   Height:        Intake/Output Summary (Last 24 hours) at 01/28/2019 0636 Last data filed at 01/28/2019 0600 Gross per 24 hour  Intake 4128.91 ml  Output 3830 ml  Net 298.91 ml   Filed Weights   01/20/19 0631 01/21/19 0457 01/28/19 0500  Weight: 108.3 kg 108.6 kg 106.4 kg    Telemetry    Unremarkable- Personally Reviewed  ECG    Normal sinus, somewhat elevated T waves in V2 and V3.- Personally Reviewed  Physical Exam   GEN: No acute distress.   Cardiac: RRR, no murmurs, rubs, or gallops.  Respiratory: Clear to auscultation bilaterally. GI: Soft, nontender, non-distended  MS:  Left BKA Neuro:  Nonfocal   Labs    Chemistry Recent Labs  Lab 01/25/19 0223 01/26/19 0255 01/27/19 0554 01/27/19 1154  01/27/19  1715 01/27/19 1822 01/28/19 0300  NA 135 136 138 137   < > 138 137 136  K 4.3 4.3 4.2 3.9   < > 4.6 4.7 4.9  CL 99 103 103  --   --   --  105 107  CO2 23 24 26   --   --   --  22 21*  GLUCOSE 132* 199* 139* 110*  --   --  120* 102*  BUN 44* 43* 43*  --   --   --  39* 43*  CREATININE 5.48* 4.97* 4.45*  --   --   --  4.21* 4.46*  CALCIUM 8.9 8.7* 9.0  --   --   --  8.7* 8.3*  ALBUMIN 2.6* 2.4* 2.8*  --   --   --   --   --   GFRNONAA 11* 12* 14*  --   --   --  15* 14*  GFRAA 13* 14* 16*  --   --   --  17* 16*  ANIONGAP 13 9 9   --   --   --  10 8   < > = values in this interval not displayed.     Hematology Recent Labs  Lab 01/27/19 1153  01/27/19 1715 01/27/19 1822 01/28/19 0300  WBC 7.2  --   --  11.8* 11.2*  RBC 2.73*  --   --  3.12* 2.86*  HGB 8.3*   < > 9.2* 9.5* 8.4*  HCT 24.6*   < > 27.0* 28.3* 26.8*  MCV  90.1  --   --  90.7 93.7  MCH 30.4  --   --  30.4 29.4  MCHC 33.7  --   --  33.6 31.3  RDW 13.8  --   --  14.1 14.1  PLT 144*  --   --  204 175   < > = values in this interval not displayed.    Cardiac EnzymesNo results for input(s): TROPONINI in the last 168 hours. No results for input(s): TROPIPOC in the last 168 hours.   BNPNo results for input(s): BNP, PROBNP in the last 168 hours.   DDimer No results for input(s): DDIMER in the last 168 hours.   Radiology    Dg Chest Port 1 View  Result Date: 01/27/2019 CLINICAL DATA:  Post CABG EXAM: PORTABLE CHEST 1 VIEW COMPARISON:  01/19/2019 FINDINGS: Endotracheal tube is 3 cm above the carina. Swan-Ganz catheter tip in the main pulmonary artery. Left basilar chest tube. No visible pneumothorax. NG tube is in the proximal stomach with the side port near the GE junction. Changes of CABG. Left base and upper lobe atelectasis. Mild vascular congestion. IMPRESSION: Status post CABG. Support devices in expected position. No pneumothorax. Areas of atelectasis in the left lung. Electronically Signed   By: Rolm Baptise M.D.   On: 01/27/2019 12:25    Cardiac Studies   Cath: 01/21/19  Failed PCI of mid LAD due to failure to cross with wire because of tortuosity and calcification.  RECOMMENDATION:   Sequential off-pump LIMA to LAD diagonal if possible.  High risk for kidney failure requiring dialysis either as a result of contrast used today, 90 cc or certainly after bypass surgery.   01/27/2019-echocardiogram  POST-OP IMPRESSIONS - Left Ventricle: The left ventricle is unchanged from pre-bypass. - Right Ventricle: The right ventricle appears unchanged from pre-bypass. - Aorta: The aorta appears unchanged from pre-bypass. - Left Atrium: The left atrium appears unchanged from pre-bypass. - Left  Atrial Appendage: The left atrial appendage appears unchanged from pre-bypass. - Aortic Valve: The aortic valve appears unchanged from pre-bypass.  - Mitral Valve: The mitral valve appears unchanged from pre-bypass. - Tricuspid Valve: The tricuspid valve appears unchanged from pre-bypass. - Interatrial Septum: The interatrial septum appears unchanged from pre-bypass. - Interventricular Septum: The interventricular septum appears unchanged from pre-bypass. - Pericardium: The pericardium appears unchanged from pre-bypass.  PRE-OP FINDINGS  Left Ventricle: The left ventricle has normal systolic function, with an ejection fraction of 55-60%. The cavity size was normal. There is no increase in left ventricular wall thickness.  Right Ventricle: The right ventricle has normal systolic function. The cavity was normal. There is no increase in right ventricular wall thickness.  Left Atrium: Left atrial size was normal in size.  Right Atrium: Right atrial size was normal in size. Right atrial pressure is estimated at 10 mmHg.  Interatrial Septum: No atrial level shunt detected by color flow Doppler.  Pericardium: There is no evidence of pericardial effusion.  Mitral Valve: The mitral valve is normal in structure. Mitral valve regurgitation is mild by color flow Doppler. The MR jet is centrally-directed.  Tricuspid Valve: The tricuspid valve was normal in structure. Tricuspid valve regurgitation is mild by color flow Doppler.  Aortic Valve: The aortic valve is tricuspid Aortic valve regurgitation was not visualized by color flow Doppler. There is no stenosis of the aortic valve.  Pulmonic Valve: The pulmonic valve was not well visualized. Pulmonic valve regurgitation is trivial by color flow Doppler.   Aorta: The aortic root, ascending aorta and aortic arch are normal in size and structure.  Patient Profile     53 y.o. male with a history of hypertension, uncontrolled diabetes mellitus, hyperlipidemia, mild PAD, CKD stage III with proteinuria, CAD with silent ischemias/[facility] concerning for myocardial infarction as initial EKG reviewed T wave inversion and minimal ST elevation at the inferior lateral leads.  He underwent coronary angiography on 01/20/2019 which revealed 90% stenosis of mid LAD however he had failed PCI due to failure to cross wire from tortuosity and calcification.  He is now status post CABG x2  Assessment & Plan    #CAD/Aborted Lateral MI:  #Status post CABG x2 POD 1 He is feeling well postop and currently denies chest pain or shortness of breath. -Continue aspirin, Lipitor, metoprolol  #Acute on chronic kidney disease stage III-IV #Nephrotic syndrome He unfortunately had suffered from worsening renal function as a result from cardiorenal syndrome and possibly contrast induced nephropathy.  Serum creatinine today 4.4 from 4.2 yesterday. (4.4<--4.9) -Per nephrology, no indication for dialysis at this time  #Hypertension He is now off phenylephrine and has remained hemodynamically stable.  BP this morning at 124/75 -Continue metoprolol -Nicardipine per cardiothoracic surgery  #Hyperlipidemia -Continue Lipitor  #Uncontrolled diabetes mellitus -Continue insulin -Continue to monitor CBG      For questions or updates, please contact [facility] Please consult [email or web address] for contact info under Cardiology/STEMI.      Signed, Jean Rosenthal, MD  01/28/2019, 6:36 AM    I have examined the patient and reviewed assessment and plan and discussed with patient.  Agree with above as stated.    Doing well.  Nicardipine to bring down BP.  Cr has been better post op than it was at the peak post cath.  Hopefully, it will continue to drop.   Larae Grooms

## 2019-01-29 ENCOUNTER — Inpatient Hospital Stay (HOSPITAL_COMMUNITY): Payer: BC Managed Care – PPO

## 2019-01-29 LAB — CBC
HCT: 24.8 % — ABNORMAL LOW (ref 39.0–52.0)
Hemoglobin: 8 g/dL — ABNORMAL LOW (ref 13.0–17.0)
MCH: 30.4 pg (ref 26.0–34.0)
MCHC: 32.3 g/dL (ref 30.0–36.0)
MCV: 94.3 fL (ref 80.0–100.0)
Platelets: 177 10*3/uL (ref 150–400)
RBC: 2.63 MIL/uL — ABNORMAL LOW (ref 4.22–5.81)
RDW: 14.2 % (ref 11.5–15.5)
WBC: 11 10*3/uL — ABNORMAL HIGH (ref 4.0–10.5)
nRBC: 0 % (ref 0.0–0.2)

## 2019-01-29 LAB — GLUCOSE, CAPILLARY
Glucose-Capillary: 123 mg/dL — ABNORMAL HIGH (ref 70–99)
Glucose-Capillary: 130 mg/dL — ABNORMAL HIGH (ref 70–99)
Glucose-Capillary: 149 mg/dL — ABNORMAL HIGH (ref 70–99)
Glucose-Capillary: 182 mg/dL — ABNORMAL HIGH (ref 70–99)
Glucose-Capillary: 183 mg/dL — ABNORMAL HIGH (ref 70–99)
Glucose-Capillary: 191 mg/dL — ABNORMAL HIGH (ref 70–99)
Glucose-Capillary: 230 mg/dL — ABNORMAL HIGH (ref 70–99)
Glucose-Capillary: 77 mg/dL (ref 70–99)

## 2019-01-29 LAB — RENAL FUNCTION PANEL
Albumin: 2.2 g/dL — ABNORMAL LOW (ref 3.5–5.0)
Anion gap: 7 (ref 5–15)
BUN: 47 mg/dL — ABNORMAL HIGH (ref 6–20)
CO2: 23 mmol/L (ref 22–32)
Calcium: 8.5 mg/dL — ABNORMAL LOW (ref 8.9–10.3)
Chloride: 107 mmol/L (ref 98–111)
Creatinine, Ser: 4.25 mg/dL — ABNORMAL HIGH (ref 0.61–1.24)
GFR calc Af Amer: 17 mL/min — ABNORMAL LOW (ref >60)
GFR calc non Af Amer: 15 mL/min — ABNORMAL LOW (ref >60)
Glucose, Bld: 127 mg/dL — ABNORMAL HIGH (ref 70–99)
Phosphorus: 5.2 mg/dL — ABNORMAL HIGH (ref 2.5–4.6)
Potassium: 4.6 mmol/L (ref 3.5–5.1)
Sodium: 137 mmol/L (ref 135–145)

## 2019-01-29 MED ORDER — METOCLOPRAMIDE HCL 5 MG/ML IJ SOLN
5.0000 mg | Freq: Four times a day (QID) | INTRAMUSCULAR | Status: DC
  Administered 2019-01-29 – 2019-01-31 (×8): 5 mg via INTRAVENOUS
  Filled 2019-01-29 (×8): qty 2

## 2019-01-29 MED ORDER — INSULIN ASPART 100 UNIT/ML ~~LOC~~ SOLN
0.0000 [IU] | Freq: Three times a day (TID) | SUBCUTANEOUS | Status: DC
  Administered 2019-01-29: 2 [IU] via SUBCUTANEOUS
  Administered 2019-01-29: 8 [IU] via SUBCUTANEOUS
  Administered 2019-01-29: 4 [IU] via SUBCUTANEOUS
  Administered 2019-01-30: 8 [IU] via SUBCUTANEOUS
  Administered 2019-01-30: 2 [IU] via SUBCUTANEOUS
  Administered 2019-01-30: 4 [IU] via SUBCUTANEOUS
  Administered 2019-01-30 – 2019-01-31 (×2): 8 [IU] via SUBCUTANEOUS
  Administered 2019-01-31: 4 [IU] via SUBCUTANEOUS

## 2019-01-29 MED ORDER — MORPHINE SULFATE (PF) 2 MG/ML IV SOLN: 1.0000 mg | INTRAVENOUS | Status: DC | PRN

## 2019-01-29 MED ORDER — FUROSEMIDE 10 MG/ML IJ SOLN
40.0000 mg | Freq: Every day | INTRAMUSCULAR | Status: DC
  Administered 2019-01-29 – 2019-01-31 (×3): 40 mg via INTRAVENOUS
  Filled 2019-01-29 (×3): qty 4

## 2019-01-29 NOTE — Progress Notes (Signed)
Lasix 40mg  administered per order. Patient encouraged to void, however unable to despite multiple attempts while sitting and standing. Bladder scan volume >617ml. In & out cath urine output 752ml at 0030.

## 2019-01-29 NOTE — Progress Notes (Signed)
Patient ID: IRVIN BASTIN, male   DOB: 16-Apr-1965, 53 y.o.   MRN: [id number] S: Had low UOP last night and given IV lasix but had some urinary retention and had I&O with >600 O:BP 116/79   Pulse 71   Temp 99.6 F (37.6 C) (Oral)   Resp 14   Ht 5\' 10"  (1.778 m)   Wt 109.2 kg   SpO2 98%   BMI 34.54 kg/m   Intake/Output Summary (Last 24 hours) at 01/29/2019 0817 Last data filed at 01/29/2019 0300 Gross per 24 hour  Intake 1495.68 ml  Output 1010 ml  Net 485.68 ml   Intake/Output: I/O last 3 completed shifts: In: 2148.9 [I.V.:1698.8; IV Piggyback:450.1] Out: 8413 [Urine:1320; Chest Tube:210]  Intake/Output this shift:  No intake/output data recorded. Weight change: 2.8 kg Gen: NAD CVS: No rub Resp: cta Abd: +BS, soft, NT/ND Ext: trace edema of right lower ext, s/p LBKA  Recent Labs  Lab 01/23/19 0228 01/24/19 0323 01/25/19 0223 01/26/19 0255 01/27/19 0554 01/27/19 1154 01/27/19 1227 01/27/19 1623 01/27/19 1715 01/27/19 1822 01/28/19 0300 01/28/19 1653 01/29/19 0306  NA 136 135 135 136 138 137 139 139 138 137  136 134* 137  K 4.2 4.2 4.3 4.3 4.2 3.9 3.8 4.4 4.6 4.7 4.9 4.7 4.6  CL 102 101 99 103 103  --   --   --   --  105 107 105 107  CO2 24 24 23 24 26   --   --   --   --  22 21* 19* 23  GLUCOSE 165* 244* 132* 199* 139* 110*  --   --   --  120* 102* 132* 127*  BUN 36* 41* 44* 43* 43*  --   --   --   --  39* 43* 45* 47*  CREATININE 4.35* 5.42* 5.48* 4.97* 4.45*  --   --   --   --  4.21* 4.46* 4.27* 4.25*  ALBUMIN 2.4* 2.7* 2.6* 2.4* 2.8*  --   --   --   --   --   --   --  2.2*  CALCIUM 8.4* 8.9 8.9 8.7* 9.0  --   --   --   --  8.7* 8.3* 8.4* 8.5*  PHOS 5.1* 5.3* 5.2* 5.1* 5.0*  --   --   --   --   --   --   --  5.2*   Liver Function Tests: Recent Labs  Lab 01/26/19 0255 01/27/19 0554 01/29/19 0306  ALBUMIN 2.4* 2.8* 2.2*   No results for input(s): LIPASE, AMYLASE in the last 168 hours. No results for input(s): AMMONIA in the last 168 hours. CBC: Recent  Labs  Lab 01/27/19 1153  01/27/19 1822 01/28/19 0300 01/28/19 1653 01/29/19 0306  WBC 7.2  --  11.8* 11.2* 13.8* 11.0*  HGB 8.3*   < > 9.5* 8.4* 9.2* 8.0*  HCT 24.6*   < > 28.3* 26.8* 27.3* 24.8*  MCV 90.1  --  90.7 93.7 91.6 94.3  PLT 144*  --  204 175 190 177   < > = values in this interval not displayed.   Cardiac Enzymes: No results for input(s): CKTOTAL, CKMB, CKMBINDEX, TROPONINI in the last 168 hours. CBG: Recent Labs  Lab 01/28/19 1836 01/28/19 2019 01/29/19 0000 01/29/19 0308 01/29/19 0334  GLUCAP 121* 126* 123* 77 130*    Iron Studies: No results for input(s): IRON, TIBC, TRANSFERRIN, FERRITIN in the last 72 hours. Studies/Results: Dg Chest Port 1 [id number]  Result Date: 01/29/2019 CLINICAL DATA:  Follow-up pleural effusion. Chest pain and shortness of breath. EXAM: PORTABLE CHEST 1 VIEW COMPARISON:  01/28/2019 FINDINGS: Swan-Ganz catheter is been removed. Right internal jugular venous access sheath remains in place. Mediastinal/pleural drains remain in place. The right lung is clear. No visible pneumothorax. Mild atelectasis persists at the left base. No worsening or new finding. IMPRESSION: Swan-Ganz catheter removed. Persistent mild atelectasis at the left base. No visible pneumothorax. Electronically Signed   By: Nelson Chimes M.D.   On: 01/29/2019 08:02   Dg Chest Port 1 View  Result Date: 01/28/2019 CLINICAL DATA:  Chest tube after cardiac surgery EXAM: PORTABLE CHEST 1 VIEW COMPARISON:  Yesterday FINDINGS: Extubation of the esophagus and trachea. Swan-Ganz catheter from right IJ approach with tip at the right main pulmonary artery. Chest tubes are present. Mild atelectasis. Stable mild cardiomegaly. CABG. No visible pneumothorax. IMPRESSION: 1. Stable remaining hardware. 2. Mild atelectasis.  No visible pneumothorax. Electronically Signed   By: Monte Fantasia M.D.   On: 01/28/2019 07:07   Dg Chest Port 1 View  Result Date: 01/27/2019 CLINICAL DATA:  Post CABG  EXAM: PORTABLE CHEST 1 VIEW COMPARISON:  01/19/2019 FINDINGS: Endotracheal tube is 3 cm above the carina. Swan-Ganz catheter tip in the main pulmonary artery. Left basilar chest tube. No visible pneumothorax. NG tube is in the proximal stomach with the side port near the GE junction. Changes of CABG. Left base and upper lobe atelectasis. Mild vascular congestion. IMPRESSION: Status post CABG. Support devices in expected position. No pneumothorax. Areas of atelectasis in the left lung. Electronically Signed   By: Rolm Baptise M.D.   On: 01/27/2019 12:25   . acetaminophen  1,000 mg Oral Q6H   Or  . acetaminophen (TYLENOL) oral liquid 160 mg/5 mL  1,000 mg Per Tube Q6H  . aspirin EC  325 mg Oral Daily   Or  . aspirin  324 mg Per Tube Daily  . atorvastatin  80 mg Oral q1800  . bisacodyl  10 mg Oral Daily   Or  . bisacodyl  10 mg Rectal Daily  . Chlorhexidine Gluconate Cloth  6 each Topical Daily  . docusate sodium  200 mg Oral Daily  . enoxaparin (LOVENOX) injection  30 mg Subcutaneous QHS  . gabapentin  300 mg Oral QPC breakfast  . insulin aspart  0-24 Units Subcutaneous Q4H  . insulin glargine  20 Units Subcutaneous Daily  . metoCLOPramide (REGLAN) injection  10 mg Intravenous Q6H  . metoprolol tartrate  25 mg Oral BID  . pantoprazole  40 mg Oral Daily  . sodium chloride flush  3 mL Intravenous Q12H    BMET    Component Value Date/Time   NA 137 01/29/2019 0306   K 4.6 01/29/2019 0306   K 4.5 12/29/2013 1521   CL 107 01/29/2019 0306   CO2 23 01/29/2019 0306   GLUCOSE 127 (H) 01/29/2019 0306   BUN 47 (H) 01/29/2019 0306   CREATININE 4.25 (H) 01/29/2019 0306   CREATININE 2.42 (H) 12/31/2018 1454   CALCIUM 8.5 (L) 01/29/2019 0306   GFRNONAA 15 (L) 01/29/2019 0306   GFRAA 17 (L) 01/29/2019 0306   CBC    Component Value Date/Time   WBC 11.0 (H) 01/29/2019 0306   RBC 2.63 (L) 01/29/2019 0306   HGB 8.0 (L) 01/29/2019 0306   HCT 24.8 (L) 01/29/2019 0306   HCT 24.9 (L) 09/04/2017  2358   PLT 177 01/29/2019 0306   MCV 94.3 01/29/2019 0306  MCH 30.4 01/29/2019 0306   MCHC 32.3 01/29/2019 0306   RDW 14.2 01/29/2019 0306   LYMPHSABS 1.7 09/15/2017 0532   MONOABS 0.6 09/15/2017 0532   EOSABS 0.5 09/15/2017 0532   BASOSABS 0.1 09/15/2017 0532     Assessment/Plan:  1. 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  1. Cr appears to have stabilized and UOP has improved after IV lasix and I&O cath 2. No indication for dialysis at this time 3. Will need to follow bladder scans periodically as he may need bladder retraining. 2. ACS- s/p cath with severe LAD and failed attempt for PCI now for CABG when renal function stabilizes/improves. Currently without chest pain. 1. S/pCABGx 2 (LIMA-LAD, RSVG-D1)01/27/19 3. HTN/volume- variable but stable.  4. CHF- good UOP and lasix on hold after surgery.  Will continue to follow. 5. Anemia of CKD with some blood loss. Transfuse as needed. 6. Constipation- Avoid mg citrate and fleets enemas due to AKI.  Donetta Potts, MD [employer] [phone]

## 2019-01-29 NOTE — Progress Notes (Signed)
2 Days Post-Op Procedure(s) (LRB): OFF PUMP CORONARY ARTERY BYPASS GRAFTING (CABG) X 2 WITH ENDOSCOPIC HARVESTING OF RIGHT GREATER SAPHENOUS VEIN. LIMA TO LAD (N/A) TRANSESOPHAGEAL ECHOCARDIOGRAM (TEE) (N/A) Subjective: Nausea better Creat stable 4.2 CXR clear NSR Objective: Vital signs in last 24 hours: Temp:  [98.1 F (36.7 C)-99.6 F (37.6 C)] 98.1 F (36.7 C) (10/17 0832) Pulse Rate:  [66-84] 78 (10/17 0800) Cardiac Rhythm: Normal sinus rhythm (10/17 0800) Resp:  [9-21] 11 (10/17 0800) BP: (90-138)/(56-91) 136/83 (10/17 0800) SpO2:  [92 %-99 %] 97 % (10/17 0800) Arterial Line BP: (133-159)/(56-66) 142/59 (10/17 0000) Weight:  [109.2 kg] 109.2 kg (10/17 0500)  Hemodynamic parameters for last 24 hours: PAP: (30)/(17) 30/17  Intake/Output from previous day: 10/16 0701 - 10/17 0700 In: 1516.7 [I.V.:1416.7; IV Piggyback:100] Out: 1010 [Urine:950; Chest Tube:60] Intake/Output this shift: No intake/output data recorded.       Exam    General- alert and comfortable    Neck- no JVD, no cervical adenopathy palpable, no carotid bruit   Lungs- clear without rales, wheezes   Cor- regular rate and rhythm, no murmur , gallop   Abdomen- soft, non-tender   Extremities - warm, non-tender, minimal edema   Neuro- oriented, appropriate, no focal weakness   Lab Results: Recent Labs    01/28/19 1653 01/29/19 0306  WBC 13.8* 11.0*  HGB 9.2* 8.0*  HCT 27.3* 24.8*  PLT 190 177   BMET:  Recent Labs    01/28/19 1653 01/29/19 0306  NA 134* 137  K 4.7 4.6  CL 105 107  CO2 19* 23  GLUCOSE 132* 127*  BUN 45* 47*  CREATININE 4.27* 4.25*  CALCIUM 8.4* 8.5*    PT/INR:  Recent Labs    01/27/19 1153  LABPROT 16.4*  INR 1.3*   ABG    Component Value Date/Time   PHART 7.330 (L) 01/27/2019 1715   HCO3 23.3 01/27/2019 1715   TCO2 25 01/27/2019 1715   ACIDBASEDEF 3.0 (H) 01/27/2019 1715   O2SAT 93.0 01/27/2019 1715   CBG (last 3)  Recent Labs    01/29/19 0334  01/29/19 0822 01/29/19 0930  GLUCAP 130* 183* 182*    Assessment/Plan: S/P Procedure(s) (LRB): OFF PUMP CORONARY ARTERY BYPASS GRAFTING (CABG) X 2 WITH ENDOSCOPIC HARVESTING OF RIGHT GREATER SAPHENOUS VEIN. LIMA TO LAD (N/A) TRANSESOPHAGEAL ECHOCARDIOGRAM (TEE) (N/A) Mobilize Diuresis Diabetes control d/c tubes/lines Keep in ICU  LOS: 10 days    Tharon Aquas Trigt III 01/29/2019

## 2019-01-29 NOTE — Progress Notes (Signed)
CT surgery p.m. Rounds  Patient ambulated in hallway with prosthesis on left leg Maintaining sinus rhythm Looks good for transfer to stepdown tomorrow Nausea has resolved

## 2019-01-29 NOTE — Evaluation (Signed)
Physical Therapy Evaluation Patient Details Name: Chase Clark MRN: [id number] DOB: 03-31-1966 Today's Date: 01/29/2019   History of Present Illness  53 y.o. male with a history of hypertension, uncontrolled diabetes mellitus, hyperlipidemia, mild PAD, CKD stage III with proteinuria, CAD with silent ischemia and prior L BKA admitted with STEMI.  He underwent coronary angiography on 01/20/2019 which revealed 90% stenosis of mid LAD however he had failed PCI due to failure to cross wire from tortuosity and calcification.  He is now status post CABG x2 and has had some post op renal failure.  Clinical Impression  Patient presents with decreased independence with mobility due to generalized weakness and sternal precautions following surgery.  Currently is min A for ambulation in hallway with RW.  Feel he will progress and be able to go home with family support and possibly will need follow up HHPT.  PT to follow acutely.     Follow Up Recommendations Home health PT    Equipment Recommendations  3in1 (PT)    Recommendations for Other Services       Precautions / Restrictions Precautions Precautions: Fall;Sternal Required Braces or Orthoses: Other Brace Other Brace: L LE prosthesis Restrictions Weight Bearing Restrictions: Yes      Mobility  Bed Mobility               General bed mobility comments: up in chair  Transfers Overall transfer level: Needs assistance Equipment used: Rolling walker (2 wheeled) Transfers: Sit to/from Stand Sit to Stand: Mod assist;+2 safety/equipment         General transfer comment: cues to hug pillow to chest and rocking forward for momentum, nurse assisting as well  Ambulation/Gait Ambulation/Gait assistance: Min assist;Min guard Gait Distance (Feet): 150 Feet Assistive device: Rolling walker (2 wheeled) Gait Pattern/deviations: Step-through pattern;Decreased stride length     General Gait Details: generally stable, but minguard for  support due to generalized weakness.  Stairs            Wheelchair Mobility    Modified Rankin (Stroke Patients Only)       Balance Overall balance assessment: Needs assistance   Sitting balance-Leahy Scale: Good     Standing balance support: Bilateral upper extremity supported Standing balance-Leahy Scale: Poor Standing balance comment: right now UE support needed for balance                             Pertinent Vitals/Pain Pain Assessment: Faces Faces Pain Scale: Hurts little more Pain Location: chest more with mobility Pain Descriptors / Indicators: Discomfort;Sore Pain Intervention(s): Monitored during session;Repositioned    Home Living Family/patient expects to be discharged to:: Private residence Living Arrangements: Spouse/significant other Available Help at Discharge: Family;Available 24 hours/day Type of Home: Apartment Home Access: Level entry     Home Layout: One level Home Equipment: Walker - 2 wheels;Wheelchair - manual;Cane - single point Additional Comments: uses two chairs to get into tub (one out and one in)    Prior Function Level of Independence: Independent with assistive device(s)               Hand Dominance   Dominant Hand: Right    Extremity/Trunk Assessment   Upper Extremity Assessment Upper Extremity Assessment: Overall WFL for tasks assessed    Lower Extremity Assessment Lower Extremity Assessment: LLE deficits/detail LLE Deficits / Details: BKA donned strength WFL       Communication   Communication: No difficulties  Cognition Arousal/Alertness:  Awake/alert Behavior During Therapy: WFL for tasks assessed/performed Overall Cognitive Status: Within Functional Limits for tasks assessed                                        General Comments General comments (skin integrity, edema, etc.): VSS with ambulation.  RN in room and supportive.    Exercises     Assessment/Plan    PT  Assessment Patient needs continued PT services  PT Problem List Decreased balance;Decreased strength;Decreased activity tolerance;Decreased mobility;Cardiopulmonary status limiting activity;Decreased knowledge of precautions;Decreased knowledge of use of DME       PT Treatment Interventions DME instruction;Therapeutic activities;Balance training;Neuromuscular re-education;Therapeutic exercise;Functional mobility training;Gait training;Patient/family education    PT Goals (Current goals can be found in the Care Plan section)  Acute Rehab PT Goals Patient Stated Goal: To get stronger, go home PT Goal Formulation: With patient Time For Goal Achievement: 02/12/19 Potential to Achieve Goals: Good    Frequency Min 3X/week   Barriers to discharge        Co-evaluation               AM-PAC PT "6 Clicks" Mobility  Outcome Measure Help needed turning from your back to your side while in a flat bed without using bedrails?: A Little Help needed moving from lying on your back to sitting on the side of a flat bed without using bedrails?: A Little Help needed moving to and from a bed to a chair (including a wheelchair)?: A Little Help needed standing up from a chair using your arms (e.g., wheelchair or bedside chair)?: A Lot Help needed to walk in hospital room?: A Little Help needed climbing 3-5 steps with a railing? : A Lot 6 Click Score: 16    End of Session   Activity Tolerance: Patient tolerated treatment well Patient left: in chair;with nursing/sitter in room   PT Visit Diagnosis: Other abnormalities of gait and mobility (R26.89)    Time: 7939-0300 PT Time Calculation (min) (ACUTE ONLY): 25 min   Charges:   PT Evaluation $PT Eval Moderate Complexity: 1 Mod PT Treatments $Gait Training: 8-22 mins        Magda Kiel, [place] [phone] 01/29/2019   Reginia Naas 01/29/2019, 11:23 AM

## 2019-01-30 LAB — RENAL FUNCTION PANEL
Albumin: 2.2 g/dL — ABNORMAL LOW (ref 3.5–5.0)
Anion gap: 9 (ref 5–15)
BUN: 50 mg/dL — ABNORMAL HIGH (ref 6–20)
CO2: 22 mmol/L (ref 22–32)
Calcium: 8.4 mg/dL — ABNORMAL LOW (ref 8.9–10.3)
Chloride: 103 mmol/L (ref 98–111)
Creatinine, Ser: 3.98 mg/dL — ABNORMAL HIGH (ref 0.61–1.24)
GFR calc Af Amer: 18 mL/min — ABNORMAL LOW (ref >60)
GFR calc non Af Amer: 16 mL/min — ABNORMAL LOW (ref >60)
Glucose, Bld: 121 mg/dL — ABNORMAL HIGH (ref 70–99)
Phosphorus: 4.4 mg/dL (ref 2.5–4.6)
Potassium: 4.2 mmol/L (ref 3.5–5.1)
Sodium: 134 mmol/L — ABNORMAL LOW (ref 135–145)

## 2019-01-30 LAB — TYPE AND SCREEN
ABO/RH(D): B POS
Antibody Screen: NEGATIVE
Unit division: 0
Unit division: 0
Unit division: 0
Unit division: 0

## 2019-01-30 LAB — BPAM RBC
Blood Product Expiration Date: 202011132359
Blood Product Expiration Date: 202011132359
Blood Product Expiration Date: 202011172359
Blood Product Expiration Date: 202011172359
ISSUE DATE / TIME: 202010150821
ISSUE DATE / TIME: 202010150821
Unit Type and Rh: 7300
Unit Type and Rh: 7300
Unit Type and Rh: 7300
Unit Type and Rh: 7300

## 2019-01-30 LAB — CBC
HCT: 25 % — ABNORMAL LOW (ref 39.0–52.0)
Hemoglobin: 8.1 g/dL — ABNORMAL LOW (ref 13.0–17.0)
MCH: 30.1 pg (ref 26.0–34.0)
MCHC: 32.4 g/dL (ref 30.0–36.0)
MCV: 92.9 fL (ref 80.0–100.0)
Platelets: 196 10*3/uL (ref 150–400)
RBC: 2.69 MIL/uL — ABNORMAL LOW (ref 4.22–5.81)
RDW: 13.9 % (ref 11.5–15.5)
WBC: 9.6 10*3/uL (ref 4.0–10.5)
nRBC: 0 % (ref 0.0–0.2)

## 2019-01-30 LAB — GLUCOSE, CAPILLARY
Glucose-Capillary: 147 mg/dL — ABNORMAL HIGH (ref 70–99)
Glucose-Capillary: 196 mg/dL — ABNORMAL HIGH (ref 70–99)
Glucose-Capillary: 210 mg/dL — ABNORMAL HIGH (ref 70–99)
Glucose-Capillary: 242 mg/dL — ABNORMAL HIGH (ref 70–99)

## 2019-01-30 MED ORDER — SORBITOL 70 % SOLN
30.0000 mL | Freq: Every morning | Status: DC
  Administered 2019-01-30: 30 mL via ORAL
  Filled 2019-01-30 (×2): qty 30

## 2019-01-30 MED ORDER — SORBITOL 70 % PO SOLN
30.0000 mL | Freq: Every morning | ORAL | Status: DC
  Filled 2019-01-30 (×2): qty 30

## 2019-01-30 MED ORDER — TAMSULOSIN HCL 0.4 MG PO CAPS
0.4000 mg | ORAL_CAPSULE | Freq: Every day | ORAL | Status: DC
  Administered 2019-01-30 – 2019-01-31 (×2): 0.4 mg via ORAL
  Filled 2019-01-30 (×2): qty 1

## 2019-01-30 NOTE — Progress Notes (Signed)
Patient ID: ALBAN MARUCCI, male   DOB: 03-Mar-1966, 53 y.o.   MRN: [id number] S: Feeling better, no new complaints O:BP 125/83 (BP Location: Right Arm)   Pulse 69   Temp 98.5 F (36.9 C) (Oral)   Resp 11   Ht 5\' 10"  (1.778 m)   Wt 109.5 kg   SpO2 90%   BMI 34.64 kg/m   Intake/Output Summary (Last 24 hours) at 01/30/2019 1000 Last data filed at 01/29/2019 2200 Gross per 24 hour  Intake 240 ml  Output 1635 ml  Net -1395 ml   Intake/Output: I/O last 3 completed shifts: In: 740.1 [P.O.:480; I.V.:160.1; IV Piggyback:100] Out: 2425 [Urine:2370; Chest Tube:55]  Intake/Output this shift:  No intake/output data recorded. Weight change: 0.3 kg Gen: NAD CVS: no rub Resp: cta Abd: +BS, soft, NT/ND Ext: trace right lower ext edema, s/p LBKA  Recent Labs  Lab 01/24/19 0323 01/25/19 0223 01/26/19 0255 01/27/19 0554 01/27/19 1154  01/27/19 1623 01/27/19 1715 01/27/19 1822 01/28/19 0300 01/28/19 1653 01/29/19 0306 01/30/19 0239  NA 135 135 136 138 137   < > 139 138 137 136 134* 137 134*  K 4.2 4.3 4.3 4.2 3.9   < > 4.4 4.6 4.7 4.9 4.7 4.6 4.2  CL 101 99 103 103  --   --   --   --  105 107 105 107 103  CO2 24 23 24 26   --   --   --   --  22 21* 19* 23 22  GLUCOSE 244* 132* 199* 139* 110*  --   --   --  120* 102* 132* 127* 121*  BUN 41* 44* 43* 43*  --   --   --   --  39* 43* 45* 47* 50*  CREATININE 5.42* 5.48* 4.97* 4.45*  --   --   --   --  4.21* 4.46* 4.27* 4.25* 3.98*  ALBUMIN 2.7* 2.6* 2.4* 2.8*  --   --   --   --   --   --   --  2.2* 2.2*  CALCIUM 8.9 8.9 8.7* 9.0  --   --   --   --  8.7* 8.3* 8.4* 8.5* 8.4*  PHOS 5.3* 5.2* 5.1* 5.0*  --   --   --   --   --   --   --  5.2* 4.4   < > = values in this interval not displayed.   Liver Function Tests: Recent Labs  Lab 01/27/19 0554 01/29/19 0306 01/30/19 0239  ALBUMIN 2.8* 2.2* 2.2*   No results for input(s): LIPASE, AMYLASE in the last 168 hours. No results for input(s): AMMONIA in the last 168 hours. CBC: Recent  Labs  Lab 01/27/19 1822 01/28/19 0300 01/28/19 1653 01/29/19 0306 01/30/19 0239  WBC 11.8* 11.2* 13.8* 11.0* 9.6  HGB 9.5* 8.4* 9.2* 8.0* 8.1*  HCT 28.3* 26.8* 27.3* 24.8* 25.0*  MCV 90.7 93.7 91.6 94.3 92.9  PLT 204 175 190 177 196   Cardiac Enzymes: No results for input(s): CKTOTAL, CKMB, CKMBINDEX, TROPONINI in the last 168 hours. CBG: Recent Labs  Lab 01/29/19 0930 01/29/19 1226 01/29/19 1559 01/29/19 2115 01/30/19 0646  GLUCAP 182* 191* 230* 149* 147*    Iron Studies: No results for input(s): IRON, TIBC, TRANSFERRIN, FERRITIN in the last 72 hours. Studies/Results: Dg Chest Port 1 View  Result Date: 01/29/2019 CLINICAL DATA:  Follow-up pleural effusion. Chest pain and shortness of breath. EXAM: PORTABLE CHEST 1 VIEW COMPARISON:  01/28/2019  FINDINGS: Swan-Ganz catheter is been removed. Right internal jugular venous access sheath remains in place. Mediastinal/pleural drains remain in place. The right lung is clear. No visible pneumothorax. Mild atelectasis persists at the left base. No worsening or new finding. IMPRESSION: Swan-Ganz catheter removed. Persistent mild atelectasis at the left base. No visible pneumothorax. Electronically Signed   By: Nelson Chimes M.D.   On: 01/29/2019 08:02   . acetaminophen  1,000 mg Oral Q6H   Or  . acetaminophen (TYLENOL) oral liquid 160 mg/5 mL  1,000 mg Per Tube Q6H  . aspirin EC  325 mg Oral Daily   Or  . aspirin  324 mg Per Tube Daily  . atorvastatin  80 mg Oral q1800  . bisacodyl  10 mg Oral Daily   Or  . bisacodyl  10 mg Rectal Daily  . Chlorhexidine Gluconate Cloth  6 each Topical Daily  . docusate sodium  200 mg Oral Daily  . enoxaparin (LOVENOX) injection  30 mg Subcutaneous QHS  . furosemide  40 mg Intravenous Daily  . gabapentin  300 mg Oral QPC breakfast  . insulin aspart  0-24 Units Subcutaneous TID AC & HS  . insulin glargine  20 Units Subcutaneous Daily  . metoCLOPramide (REGLAN) injection  5 mg Intravenous Q6H  .  metoprolol tartrate  25 mg Oral BID  . pantoprazole  40 mg Oral Daily  . sodium chloride flush  3 mL Intravenous Q12H    BMET    Component Value Date/Time   NA 134 (L) 01/30/2019 0239   K 4.2 01/30/2019 0239   K 4.5 12/29/2013 1521   CL 103 01/30/2019 0239   CO2 22 01/30/2019 0239   GLUCOSE 121 (H) 01/30/2019 0239   BUN 50 (H) 01/30/2019 0239   CREATININE 3.98 (H) 01/30/2019 0239   CREATININE 2.42 (H) 12/31/2018 1454   CALCIUM 8.4 (L) 01/30/2019 0239   GFRNONAA 16 (L) 01/30/2019 0239   GFRAA 18 (L) 01/30/2019 0239   CBC    Component Value Date/Time   WBC 9.6 01/30/2019 0239   RBC 2.69 (L) 01/30/2019 0239   HGB 8.1 (L) 01/30/2019 0239   HCT 25.0 (L) 01/30/2019 0239   HCT 24.9 (L) 09/04/2017 2358   PLT 196 01/30/2019 0239   MCV 92.9 01/30/2019 0239   MCH 30.1 01/30/2019 0239   MCHC 32.4 01/30/2019 0239   RDW 13.9 01/30/2019 0239   LYMPHSABS 1.7 09/15/2017 0532   MONOABS 0.6 09/15/2017 0532   EOSABS 0.5 09/15/2017 0532   BASOSABS 0.1 09/15/2017 0532    Assessment/Plan:  1. 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  1. Cr appears to have peaked and now starting to improve 2. No indication for dialysis at this time 3. Will need to follow bladder scans periodically as he may need bladder retraining as he still has large PVR's. 2. ACS- s/p cath with severe LAD and failed attempt for PCI now for CABG when renal function stabilizes/improves. Currently without chest pain. 1. S/pCABGx 2 (LIMA-LAD, RSVG-D1)01/27/19 3. HTN/volume- variable but stable.  4. CHF- good UOP andlasix on hold after surgery.Will continue to follow. 5. Anemia of CKD with some blood loss. Transfuse as needed. 6. Constipation- Avoid mg citrate and fleets enemas due to AKI.  Donetta Potts, MD [employer] [phone]

## 2019-01-30 NOTE — Progress Notes (Signed)
3 Days Post-Op Procedure(s) (LRB): OFF PUMP CORONARY ARTERY BYPASS GRAFTING (CABG) X 2 WITH ENDOSCOPIC HARVESTING OF RIGHT GREATER SAPHENOUS VEIN. LIMA TO LAD (N/A) TRANSESOPHAGEAL ECHOCARDIOGRAM (TEE) (N/A) Subjective: Doing well. Walking nsr Some urinary retention- start flomax  Objective: Vital signs in last 24 hours: Temp:  [98.5 F (36.9 C)-99.2 F (37.3 C)] 98.5 F (36.9 C) (10/18 0801) Pulse Rate:  [62-95] 69 (10/18 0801) Cardiac Rhythm: Normal sinus rhythm (10/18 0400) Resp:  [11-19] 11 (10/18 0801) BP: (75-138)/(51-88) 125/83 (10/18 0801) SpO2:  [89 %-100 %] 90 % (10/18 0801) Weight:  [109.5 kg] 109.5 kg (10/18 0500)  Hemodynamic parameters for last 24 hours:    Intake/Output from previous day: 10/17 0701 - 10/18 0700 In: 480 [P.O.:480] Out: 1635 [Urine:1620; Chest Tube:15] Intake/Output this shift: No intake/output data recorded.       Exam    General- alert and comfortable    Neck- no JVD, no cervical adenopathy palpable, no carotid bruit   Lungs- clear without rales, wheezes   Cor- regular rate and rhythm, no murmur , gallop   Abdomen- soft, non-tender   Extremities - warm, non-tender, minimal edema   Neuro- oriented, appropriate, no focal weakness   Lab Results: Recent Labs    01/29/19 0306 01/30/19 0239  WBC 11.0* 9.6  HGB 8.0* 8.1*  HCT 24.8* 25.0*  PLT 177 196   BMET:  Recent Labs    01/29/19 0306 01/30/19 0239  NA 137 134*  K 4.6 4.2  CL 107 103  CO2 23 22  GLUCOSE 127* 121*  BUN 47* 50*  CREATININE 4.25* 3.98*  CALCIUM 8.5* 8.4*    PT/INR:  Recent Labs    01/27/19 1153  LABPROT 16.4*  INR 1.3*   ABG    Component Value Date/Time   PHART 7.330 (L) 01/27/2019 1715   HCO3 23.3 01/27/2019 1715   TCO2 25 01/27/2019 1715   ACIDBASEDEF 3.0 (H) 01/27/2019 1715   O2SAT 93.0 01/27/2019 1715   CBG (last 3)  Recent Labs    01/29/19 1559 01/29/19 2115 01/30/19 0646  GLUCAP 230* 149* 147*    Assessment/Plan: S/P  Procedure(s) (LRB): OFF PUMP CORONARY ARTERY BYPASS GRAFTING (CABG) X 2 WITH ENDOSCOPIC HARVESTING OF RIGHT GREATER SAPHENOUS VEIN. LIMA TO LAD (N/A) TRANSESOPHAGEAL ECHOCARDIOGRAM (TEE) (N/A) tx stepdown Follow creatinine   LOS: 11 days    Tharon Aquas Trigt III 01/30/2019

## 2019-01-30 NOTE — Progress Notes (Signed)
CT surgery p.m. Rounds  Patient had good day- voided without catheterization and had BM x2 Sinus rhythm Waiting for stepdown bed

## 2019-01-31 ENCOUNTER — Inpatient Hospital Stay (HOSPITAL_COMMUNITY): Payer: BC Managed Care – PPO

## 2019-01-31 DIAGNOSIS — J9811 Atelectasis: Secondary | ICD-10-CM | POA: Diagnosis not present

## 2019-01-31 DIAGNOSIS — I208 Other forms of angina pectoris: Secondary | ICD-10-CM | POA: Diagnosis not present

## 2019-01-31 DIAGNOSIS — I10 Essential (primary) hypertension: Secondary | ICD-10-CM | POA: Diagnosis not present

## 2019-01-31 DIAGNOSIS — N179 Acute kidney failure, unspecified: Secondary | ICD-10-CM | POA: Diagnosis not present

## 2019-01-31 DIAGNOSIS — I200 Unstable angina: Secondary | ICD-10-CM | POA: Diagnosis not present

## 2019-01-31 DIAGNOSIS — N183 Chronic kidney disease, stage 3 unspecified: Secondary | ICD-10-CM | POA: Diagnosis not present

## 2019-01-31 DIAGNOSIS — I129 Hypertensive chronic kidney disease with stage 1 through stage 4 chronic kidney disease, or unspecified chronic kidney disease: Secondary | ICD-10-CM | POA: Diagnosis not present

## 2019-01-31 DIAGNOSIS — E8770 Fluid overload, unspecified: Secondary | ICD-10-CM | POA: Diagnosis not present

## 2019-01-31 DIAGNOSIS — E1139 Type 2 diabetes mellitus with other diabetic ophthalmic complication: Secondary | ICD-10-CM | POA: Diagnosis not present

## 2019-01-31 LAB — CBC
HCT: 26.7 % — ABNORMAL LOW (ref 39.0–52.0)
Hemoglobin: 8.5 g/dL — ABNORMAL LOW (ref 13.0–17.0)
MCH: 29.5 pg (ref 26.0–34.0)
MCHC: 31.8 g/dL (ref 30.0–36.0)
MCV: 92.7 fL (ref 80.0–100.0)
Platelets: 250 10*3/uL (ref 150–400)
RBC: 2.88 MIL/uL — ABNORMAL LOW (ref 4.22–5.81)
RDW: 13.7 % (ref 11.5–15.5)
WBC: 7.8 10*3/uL (ref 4.0–10.5)
nRBC: 0 % (ref 0.0–0.2)

## 2019-01-31 LAB — POCT I-STAT 7, (LYTES, BLD GAS, ICA,H+H)
Acid-Base Excess: 3 mmol/L — ABNORMAL HIGH (ref 0.0–2.0)
Bicarbonate: 28.5 mmol/L — ABNORMAL HIGH (ref 20.0–28.0)
Bicarbonate: 25 mmol/L (ref 20.0–28.0)
Calcium, Ion: 1.27 mmol/L (ref 1.15–1.40)
Calcium, Ion: 1.23 mmol/L (ref 1.15–1.40)
HCT: 24 % — ABNORMAL LOW (ref 39.0–52.0)
HCT: 17 % — ABNORMAL LOW (ref 39.0–52.0)
Hemoglobin: 8.2 g/dL — ABNORMAL LOW (ref 13.0–17.0)
Hemoglobin: 5.8 g/dL — CL (ref 13.0–17.0)
O2 Saturation: 100 %
O2 Saturation: 100 %
Potassium: 4.3 mmol/L (ref 3.5–5.1)
Potassium: 3.9 mmol/L (ref 3.5–5.1)
Sodium: 136 mmol/L (ref 135–145)
Sodium: 138 mmol/L (ref 135–145)
TCO2: 30 mmol/L (ref 22–32)
TCO2: 26 mmol/L (ref 22–32)
pCO2 arterial: 48.3 mmHg — ABNORMAL HIGH (ref 32.0–48.0)
pCO2 arterial: 39.3 mmHg (ref 32.0–48.0)
pH, Arterial: 7.379 (ref 7.350–7.450)
pH, Arterial: 7.411 (ref 7.350–7.450)
pO2, Arterial: 356 mmHg — ABNORMAL HIGH (ref 83.0–108.0)
pO2, Arterial: 169 mmHg — ABNORMAL HIGH (ref 83.0–108.0)

## 2019-01-31 LAB — POCT I-STAT, CHEM 8
BUN: 37 mg/dL — ABNORMAL HIGH (ref 6–20)
BUN: 37 mg/dL — ABNORMAL HIGH (ref 6–20)
BUN: 34 mg/dL — ABNORMAL HIGH (ref 6–20)
Calcium, Ion: 1.28 mmol/L (ref 1.15–1.40)
Calcium, Ion: 1.23 mmol/L (ref 1.15–1.40)
Calcium, Ion: 1.22 mmol/L (ref 1.15–1.40)
Chloride: 99 mmol/L (ref 98–111)
Chloride: 103 mmol/L (ref 98–111)
Chloride: 102 mmol/L (ref 98–111)
Creatinine, Ser: 4.4 mg/dL — ABNORMAL HIGH (ref 0.61–1.24)
Creatinine, Ser: 4.4 mg/dL — ABNORMAL HIGH (ref 0.61–1.24)
Creatinine, Ser: 4.3 mg/dL — ABNORMAL HIGH (ref 0.61–1.24)
Glucose, Bld: 133 mg/dL — ABNORMAL HIGH (ref 70–99)
Glucose, Bld: 121 mg/dL — ABNORMAL HIGH (ref 70–99)
Glucose, Bld: 115 mg/dL — ABNORMAL HIGH (ref 70–99)
HCT: 32 % — ABNORMAL LOW (ref 39.0–52.0)
HCT: 26 % — ABNORMAL LOW (ref 39.0–52.0)
HCT: 23 % — ABNORMAL LOW (ref 39.0–52.0)
Hemoglobin: 10.9 g/dL — ABNORMAL LOW (ref 13.0–17.0)
Hemoglobin: 8.8 g/dL — ABNORMAL LOW (ref 13.0–17.0)
Hemoglobin: 7.8 g/dL — ABNORMAL LOW (ref 13.0–17.0)
Potassium: 4.3 mmol/L (ref 3.5–5.1)
Potassium: 4.2 mmol/L (ref 3.5–5.1)
Potassium: 4 mmol/L (ref 3.5–5.1)
Sodium: 138 mmol/L (ref 135–145)
Sodium: 136 mmol/L (ref 135–145)
Sodium: 138 mmol/L (ref 135–145)
TCO2: 25 mmol/L (ref 22–32)
TCO2: 25 mmol/L (ref 22–32)
TCO2: 23 mmol/L (ref 22–32)

## 2019-01-31 LAB — RENAL FUNCTION PANEL
Albumin: 2.2 g/dL — ABNORMAL LOW (ref 3.5–5.0)
Anion gap: 9 (ref 5–15)
BUN: 44 mg/dL — ABNORMAL HIGH (ref 6–20)
CO2: 23 mmol/L (ref 22–32)
Calcium: 8.5 mg/dL — ABNORMAL LOW (ref 8.9–10.3)
Chloride: 104 mmol/L (ref 98–111)
Creatinine, Ser: 3.36 mg/dL — ABNORMAL HIGH (ref 0.61–1.24)
GFR calc Af Amer: 23 mL/min — ABNORMAL LOW (ref >60)
GFR calc non Af Amer: 19 mL/min — ABNORMAL LOW (ref >60)
Glucose, Bld: 180 mg/dL — ABNORMAL HIGH (ref 70–99)
Phosphorus: 3.6 mg/dL (ref 2.5–4.6)
Potassium: 4 mmol/L (ref 3.5–5.1)
Sodium: 136 mmol/L (ref 135–145)

## 2019-01-31 LAB — GLUCOSE, CAPILLARY
Glucose-Capillary: 182 mg/dL — ABNORMAL HIGH (ref 70–99)
Glucose-Capillary: 247 mg/dL — ABNORMAL HIGH (ref 70–99)

## 2019-01-31 MED ORDER — OXYCODONE HCL 5 MG PO TABS: 5.0000 mg | ORAL_TABLET | Freq: Four times a day (QID) | ORAL | 0 refills | Status: DC | PRN

## 2019-01-31 MED ORDER — ASPIRIN 325 MG PO TBEC: 325.0000 mg | DELAYED_RELEASE_TABLET | Freq: Every day | ORAL | 0 refills | Status: DC

## 2019-01-31 MED ORDER — GABAPENTIN 300 MG PO CAPS: 300.0000 mg | ORAL_CAPSULE | Freq: Two times a day (BID) | ORAL | 6 refills | Status: DC

## 2019-01-31 MED ORDER — FUROSEMIDE 40 MG PO TABS: 40.0000 mg | ORAL_TABLET | Freq: Every day | ORAL | 0 refills | Status: DC

## 2019-01-31 MED ORDER — ATORVASTATIN CALCIUM 80 MG PO TABS: 80.0000 mg | ORAL_TABLET | Freq: Every day | ORAL | 1 refills | Status: DC

## 2019-01-31 MED ORDER — INSULIN GLARGINE 100 UNIT/ML SOLOSTAR PEN: 40.0000 [IU] | PEN_INJECTOR | Freq: Every day | SUBCUTANEOUS | 3 refills | Status: DC

## 2019-01-31 MED ORDER — TAMSULOSIN HCL 0.4 MG PO CAPS: 0.4000 mg | ORAL_CAPSULE | Freq: Every day | ORAL | 1 refills | Status: DC

## 2019-01-31 MED ORDER — METOPROLOL TARTRATE 25 MG PO TABS: 25.0000 mg | ORAL_TABLET | Freq: Two times a day (BID) | ORAL | 1 refills | Status: DC

## 2019-01-31 MED ORDER — DARBEPOETIN ALFA 100 MCG/0.5ML IJ SOSY: 100.0000 ug | PREFILLED_SYRINGE | INTRAMUSCULAR | 1 refills | Status: DC

## 2019-01-31 MED ORDER — POTASSIUM CHLORIDE ER 20 MEQ PO TBCR: 20.0000 meq | EXTENDED_RELEASE_TABLET | Freq: Every day | ORAL | 0 refills | Status: DC

## 2019-01-31 MED ORDER — DARBEPOETIN ALFA 100 MCG/0.5ML IJ SOSY
100.0000 ug | PREFILLED_SYRINGE | INTRAMUSCULAR | Status: DC
  Administered 2019-01-31: 100 ug via SUBCUTANEOUS
  Filled 2019-01-31: qty 0.5

## 2019-01-31 NOTE — Progress Notes (Signed)
Patient discharged home with wife after giving discharge instructions/packet.  All questions answered and support given.

## 2019-01-31 NOTE — Discharge Instructions (Signed)
Acute Kidney Injury, Adult  Acute kidney injury is a sudden worsening of kidney function. The kidneys are organs that have several jobs. They filter the blood to remove waste products and extra fluid. They also maintain a healthy balance of minerals and hormones in the body, which helps control blood pressure and keep bones strong. With this condition, your kidneys do not do their jobs as well as they should. This condition ranges from mild to severe. Over time it may develop into long-lasting (chronic) kidney disease. Early detection and treatment may prevent acute kidney injury from developing into a chronic condition. What are the causes? Common causes of this condition include:  A problem with blood flow to the kidneys. This may be caused by: ? Low blood pressure (hypotension) or shock. ? Blood loss. ? Heart and blood vessel (cardiovascular) disease. ? Severe burns. ? Liver disease.  Direct damage to the kidneys. This may be caused by: ? Certain medicines. ? A kidney infection. ? Poisoning. ? Being around or in contact with toxic substances. ? A surgical wound. ? A hard, direct hit to the kidney area.  A sudden blockage of urine flow. This may be caused by: ? Cancer. ? Kidney stones. ? An enlarged prostate in males. What are the signs or symptoms? Symptoms of this condition may not be obvious until the condition becomes severe. Symptoms of this condition can include:  Tiredness (lethargy), or difficulty staying awake.  Nausea or vomiting.  Swelling (edema) of the face, legs, ankles, or feet.  Problems with urination, such as: ? Abdominal pain, or pain along the side of your stomach (flank). ? Decreased urine production. ? Decrease in the force of urine flow.  Muscle twitches and cramps, especially in the legs.  Confusion or trouble concentrating.  Loss of appetite.  Fever. How is this diagnosed? This condition may be diagnosed with tests, including:  Blood  tests.  Urine tests.  Imaging tests.  A test in which a sample of tissue is removed from the kidneys to be examined under a microscope (kidney biopsy). How is this treated? Treatment for this condition depends on the cause and how severe the condition is. In mild cases, treatment may not be needed. The kidneys may heal on their own. In more severe cases, treatment will involve:  Treating the cause of the kidney injury. This may involve changing any medicines you are taking or adjusting your dosage.  Fluids. You may need specialized IV fluids to balance your body's needs.  Having a catheter placed to drain urine and prevent blockages.  Preventing problems from occurring. This may mean avoiding certain medicines or procedures that can cause further injury to the kidneys. In some cases treatment may also require:  A procedure to remove toxic wastes from the body (dialysis or continuous renal replacement therapy - CRRT).  Surgery. This may be done to repair a torn kidney, or to remove the blockage from the urinary system. Follow these instructions at home: Medicines  Take over-the-counter and prescription medicines only as told by your health care provider.  Do not take any new medicines without your health care provider's approval. Many medicines can worsen your kidney damage.  Do not take any vitamin and mineral supplements without your health care provider's approval. Many nutritional supplements can worsen your kidney damage. Lifestyle  If your health care provider prescribed changes to your diet, follow them. You may need to decrease the amount of protein you eat.  Achieve and maintain a  healthy weight. If you need help with this, ask your health care provider.  Start or continue an exercise plan. Try to exercise at least 30 minutes a day, 5 days a week.  Do not use any tobacco products, such as cigarettes, chewing tobacco, and e-cigarettes. If you need help quitting, ask your  health care provider. General instructions  Keep track of your blood pressure. Report changes in your blood pressure as told by your health care provider.  Stay up to date with immunizations. Ask your health care provider which immunizations you need.  Keep all follow-up visits as told by your health care provider. This is important. Where to find more information  American Association of Kidney Patients: BombTimer.gl  National Kidney Foundation: www.kidney.[place]: https://mathis.com/  [facility]: ? www.lifeoptions.org ? www.kidneyschool.org Contact a health care provider if:  Your symptoms get worse.  You develop new symptoms. Get help right away if:  You develop symptoms of worsening kidney disease, which include: ? Headaches. ? Abnormally dark or light skin. ? Easy bruising. ? Frequent hiccups. ? Chest pain. ? Shortness of breath. ? End of menstruation in women. ? Seizures. ? Confusion or altered mental status. ? Abdominal or back pain. ? Itchiness.  You have a fever.  Your body is producing less urine.  You have pain or bleeding when you urinate. Summary  Acute kidney injury is a sudden worsening of kidney function.  Acute kidney injury can be caused by problems with blood flow to the kidneys, direct damage to the kidneys, and sudden blockage of urine flow.  Symptoms of this condition may not be obvious until it becomes severe. Symptoms may include edema, lethargy, confusion, nausea or vomiting, and problems passing urine.  This condition can usually be diagnosed with blood tests, urine tests, and imaging tests. Sometimes a kidney biopsy is done to diagnose this condition.  Treatment for this condition often involves treating the underlying cause. It is treated with fluids, medicines, dialysis, diet changes, or surgery. This information is not intended to replace advice given to you by your health care provider. Make  sure you discuss any questions you have with your health care provider. Document Released: 10/14/2010 Document Revised: 03/13/2017 Document Reviewed: 03/21/2016 Elsevier Patient Education  [place].   Angina  Angina is extreme discomfort in the chest, neck, arm, jaw, or back. The discomfort is caused by a lack of blood in the middle layer of the heart wall (myocardium). There are four types of angina:  Stable angina. This is triggered by vigorous activity or exercise. It goes away when you rest or take angina medicine.  Unstable angina. This is a warning sign and can lead to a heart attack (acute coronary syndrome). This is a medical emergency. Symptoms come at rest and last a long time.  Microvascular angina. This affects the small coronary arteries. Symptoms include feeling tired and being short of breath.  Prinzmetal or variant angina. This is caused by a tightening (spasm) of the arteries that go to your heart. What are the causes? This condition is caused by atherosclerosis. This is the buildup of fat and cholesterol (plaque) in your arteries. The plaque may narrow or block the artery. Other causes of angina include:  Sudden tightening of the muscles of the arteries in the heart (coronary spasm).  Small artery disease (microvascular dysfunction).  Problems with any of your heart valves (heart valve disease).  A tear in an artery in your heart (coronary  artery dissection).  Diseases of the heart muscle (cardiomyopathy), or other heart diseases. What increases the risk? You are more likely to develop this condition if you have:  High cholesterol.  High blood pressure (hypertension).  Diabetes.  A family history of heart disease.  An inactive (sedentary) lifestyle, or you do not exercise enough.  Depression.  Had radiation treatment to the left side of your chest. Other risk factors include:  Using tobacco.  Being obese.  Eating a diet high in saturated  fats.  Being exposed to high stress or triggers of stress.  Using drugs, such as cocaine. Women have a greater risk for angina if:  They are older than 39.  They have gone through menopause (are postmenopausal). What are the signs or symptoms? Common symptoms of this condition in both men and women may include:  Chest pain, which may: ? Feel like a crushing or squeezing in the chest, or like a tightness, pressure, fullness, or heaviness in the chest. ? Last for more than a few minutes at a time, or it may stop and come back (recur) over the course of a few minutes.  Pain in the neck, arm, jaw, or back.  Unexplained heartburn or indigestion.  Shortness of breath.  Nausea.  Sudden cold sweats. Women and people with diabetes may have unusual (atypical) symptoms, such as:  Fatigue.  Unexplained feelings of nervousness or anxiety.  Unexplained weakness.  Dizziness or fainting. How is this diagnosed? This condition may be diagnosed based on:  Your symptoms and medical history.  Electrocardiogram (ECG) to measure the electrical activity in your heart.  Blood tests.  Stress test to look for signs of blockage when your heart is stressed.  CT angiogram to examine your heart and the blood flow to it.  Coronary angiogram to check your coronary arteries for blockage. How is this treated? Angina may be treated with:  Medicines to: ? Prevent blood clots and heart attack. ? Relax blood vessels and improve blood flow to the heart (nitrates). ? Reduce blood pressure, improve the pumping action of the heart, and relax blood vessels that are spasming. ? Reduce cholesterol and help treat atherosclerosis.  A procedure to widen a narrowed or blocked coronary artery (angioplasty). A mesh tube may be placed in a coronary artery to keep it open (coronary stenting).  Surgery to allow blood to go around a blocked artery (coronary artery bypass surgery). Follow these instructions at  home: Medicines  Take over-the-counter and prescription medicines only as told by your health care provider.  Do not take the following medicines unless your health care provider approves: ? NSAIDs, such as ibuprofen or naproxen. ? Vitamin supplements that contain vitamin A, vitamin E, or both. ? Hormone replacement therapy that contains estrogen with or without progestin. Eating and drinking   Eat a heart-healthy diet. This includes plenty of fresh fruits and vegetables, whole grains, low-fat (lean) protein, and low-fat dairy products.  Follow instructions from your health care provider about eating or drinking restrictions. Activity  Follow an exercise program approved by your health care provider.  Consider joining a cardiac rehabilitation program.  Take a break when you feel fatigued. Plan rest periods in your daily activities. Lifestyle   Do not use any products that contain nicotine or tobacco, such as cigarettes, e-cigarettes, and chewing tobacco. If you need help quitting, ask your health care provider.  If your health care provider says you can drink alcohol: ? Limit how much you use to:  0-1 drink a day for nonpregnant women.  0-2 drinks a day for men. ? Be aware of how much alcohol is in your drink. In the [place] one drink equals one 12 oz bottle of beer (355 mL), one 5 oz glass of wine (148 mL), or one 1 oz glass of hard liquor (44 mL). General instructions  Maintain a healthy weight.  Learn to manage stress.  Keep your vaccinations up to date. Get the flu (influenza) vaccine every year.  Talk to your health care provider if you feel depressed. Take a depression screening test to see if you are at risk for depression.  Work with your health care provider to manage other health conditions, such as hypertension or diabetes.  Keep all follow-up visits as told by your health care provider. This is important. Get help right away if:  You have pain in your  chest, neck, arm, jaw, or back, and the pain: ? Lasts more than a few minutes. ? Is recurring. ? Is not relieved by taking medicines under the tongue (sublingual nitroglycerin). ? Increases in intensity or frequency.  You have a lot of sweating without cause.  You have unexplained: ? Heartburn or indigestion. ? Shortness of breath or difficulty breathing. ? Nausea or vomiting. ? Fatigue. ? Feelings of nervousness or anxiety. ? Weakness.  You have sudden light-headedness or dizziness.  You faint. These symptoms may represent a serious problem that is an emergency. Do not wait to see if the symptoms will go away. Get medical help right away. Call your local emergency services (911 in the [place] Do not drive yourself to the hospital. Summary  Angina is extreme discomfort in the chest, neck, arm, jaw, or back that is caused by a lack of blood in the heart wall.  There are many symptoms of angina. They include chest pain, unexplained heartburn or indigestion, sudden cold sweats, and fatigue.  Angina may be treated with behavioral changes, medicine, or surgery.  Symptoms of angina may represent an emergency. Get medical help right away. Call your local emergency services (911 in the [place] Do not drive yourself to the hospital. This information is not intended to replace advice given to you by your health care provider. Make sure you discuss any questions you have with your health care provider. Document Released: 03/31/2005 Document Revised: 11/16/2017 Document Reviewed: 11/16/2017 Elsevier Patient Education  2020 [place] What is cardiac rehabilitation? Cardiac rehabilitation is a treatment program that helps improve the health and well-being of people who have heart problems. Cardiac rehabilitation includes exercise training, education, and counseling to help you get stronger and return to an active lifestyle. This program can help you get better faster  and reduce any future hospital stays. Why might I need cardiac rehabilitation? Cardiac rehabilitation programs can help when you have or have had:  A heart attack.  Heart failure.  Peripheral artery disease.  Coronary artery disease.  Angina.  Lung or breathing problems. Cardiac rehabilitation programs are also used when you have had:  Coronary artery bypass graft surgery.  Heart valve replacement.  Heart stent placement.  Heart transplant.  Aneurysm repair. What are the benefits of cardiac rehabilitation? Cardiac rehabilitation can help you:  Reduce problems like chest pain and trouble breathing.  Change risk factors that contribute to heart disease, such as: ? Smoking. ? High blood pressure. ? High cholesterol. ? Diabetes. ? Being inactive. ? Weighing over 30% more than your ideal weight. ? Diet.  Improve  your emotional outlook so you feel: ? More hopeful. ? Better about yourself. ? More confident about taking care of yourself.  Get support from health experts as well as other people with similar problems.  Learn healthy ways to manage stress.  Learn how to manage and understand your medicines.  Teach your family about your condition and how to participate in your recovery. What happens in cardiac rehabilitation? You will be assessed by a cardiac rehabilitation team. They will check your health history and do a physical exam. You may need blood tests, exercise stress tests, and other evaluations to make sure that you are ready to start cardiac rehabilitation. The cardiac rehabilitation team works with you to make a plan based on your health and goals. Your program will be tailored to fit you and your needs and may change as you progress. You may work with a health care team that includes:  Doctors.  Nurses.  Dietitians.  Psychologists.  Exercise specialists.  Physical and occupational therapists. What are the phases of cardiac rehabilitation? A  cardiac rehabilitation program is often divided into phases. You advance from one phase to the next. Phase 1 This phase starts while you are still in the hospital. You may:  Start by walking in your room and then in the hall.  Do some simple exercises with a therapist.  Phase 2 This phase begins when you go home or to another facility. You will travel to a cardiac rehabilitation center or another place where rehabilitation is offered. This phase may last 8-12 weeks. During this phase:  You will slowly increase your activity level while being closely watched by a nurse or therapist.  You will have medical tests and exams to monitor your progress.  Your exercises may include strength or resistance training along with activities that cause your heart to beat faster (aerobic exercises), such as walking on a treadmill.  Your condition will determine how often and how long these sessions last.  You may learn how to: ? Lacinda Axon heart-healthy meals. ? Control your blood sugar, if this applies. ? Stop smoking. ? Manage your medicines. You may need help with scheduling or planning how and when to take your medicines. If you have questions about your medicines, it is very important that you talk with your health care provider.  Phase 3 This phase continues for the rest of your life. In this phase:  There will be less supervision.  You may continue to participate in cardiac rehabilitation activities or become part of a group in your community.  You may benefit from talking about your experience with other people who are facing similar challenges. Follow these instructions at home:  Take over-the-counter and prescription medicines only as told by your health care provider.  Keep all follow-up visits as told by your health care provider. This is important. Get help right away if:  You have severe chest discomfort, especially if the pain is crushing or pressure-like and spreads to your arms,  back, neck, or jaw. Do not wait to see if the pain will go away.  You have weakness or numbness in your face, arms, or legs, especially on one side of the body.  Your speech is slurred.  You are confused.  You have a sudden, severe headache or loss of vision.  You have shortness of breath.  You are sweating and have nausea.  You feel dizzy or faint.  You are fatigued. These symptoms may represent a serious problem that is an  emergency. Do not wait to see if the symptoms will go away. Get medical help right away. Call your local emergency services (911 in the [place] Do not drive yourself to the hospital. Summary  Cardiac rehabilitation is a treatment program that helps improve the health and well-being of people who have heart problems.  A cardiac rehabilitation program is often divided into phases. You advance from one phase to the next.  The cardiac rehabilitation team works with you to make a plan based on your health and goals.  Cardiac rehabilitation includes exercise training, education, and counseling to help you get stronger and return to an active lifestyle. This information is not intended to replace advice given to you by your health care provider. Make sure you discuss any questions you have with your health care provider. Document Released: 01/08/2008 Document Revised: 07/21/2018 Document Reviewed: 01/28/2018 Elsevier Patient Education  [place].    Discharge Instructions:     1. You may shower, please wash incisions daily with soap and water and keep dry.  If you wish to cover wounds with dressing you may do so but please keep clean and change daily.  No tub baths or swimming until incisions have completely healed.  If your incisions become red or develop any drainage please call our office at [phone]  2. No Driving until cleared by our office and you are no longer using narcotic pain medications  3. Monitor your weight daily.. Please use the same  scale and weigh at same time... If you gain 3-5 lbs in 48 hours with associated lower extremity swelling, please contact our office at [phone]  4. Fever of 101.5 for at least 24 hours, please contact our office at [phone]  5. Activity- up as tolerated, please walk at least 3 times per day.  Avoid strenuous activity, no lifting, pushing, or pulling with your arms over 8-10 lbs for a minimum of 6 weeks  6. If any questions or concerns arise, please do not hesitate to contact our office at [phone]

## 2019-01-31 NOTE — Progress Notes (Addendum)
The patient has been seen in conjunction with Nathanial Rancher, MD. All aspects of care have been considered and discussed. The patient has been personally interviewed, examined, and all clinical data has been reviewed.   He is doing well post CABG.  Received LIMA to LAD and reverse saphenous vein graft to diagonal.  Failed PCI attempt prior to surgery due to failure to cross with wire due to tortuosity and calcification.  Surgery was done electively.  Kidney function now improving.  No overt heart failure complaints at this time.  Following the lead of T CTS.  No significant arrhythmia.  Progress Note  Patient Name: Chase Clark Date of Encounter: 01/31/2019  Primary Cardiologist: No primary care provider on file.   Subjective   Chase Clark is doing well this morning.  He denies chest pain, shortness of breath or difficulty with ambulation.  He is in very good spirits  Inpatient Medications    Scheduled Meds: . acetaminophen  1,000 mg Oral Q6H   Or  . acetaminophen (TYLENOL) oral liquid 160 mg/5 mL  1,000 mg Per Tube Q6H  . aspirin EC  325 mg Oral Daily   Or  . aspirin  324 mg Per Tube Daily  . atorvastatin  80 mg Oral q1800  . bisacodyl  10 mg Oral Daily   Or  . bisacodyl  10 mg Rectal Daily  . Chlorhexidine Gluconate Cloth  6 each Topical Daily  . docusate sodium  200 mg Oral Daily  . enoxaparin (LOVENOX) injection  30 mg Subcutaneous QHS  . furosemide  40 mg Intravenous Daily  . gabapentin  300 mg Oral QPC breakfast  . insulin aspart  0-24 Units Subcutaneous TID AC & HS  . insulin glargine  20 Units Subcutaneous Daily  . metoCLOPramide (REGLAN) injection  5 mg Intravenous Q6H  . metoprolol tartrate  25 mg Oral BID  . pantoprazole  40 mg Oral Daily  . sodium chloride flush  3 mL Intravenous Q12H  . sorbitol  30 mL Oral q morning - 10a  . tamsulosin  0.4 mg Oral Daily   Continuous Infusions: . sodium chloride    . sodium chloride    . sodium chloride 10 mL/hr at  01/27/19 1205  . lactated ringers Stopped (01/29/19 0401)   PRN Meds: sodium chloride, metoprolol tartrate, morphine injection, ondansetron (ZOFRAN) IV, oxyCODONE, sodium chloride flush, traMADol   Vital Signs    Vitals:   01/31/19 0200 01/31/19 0300 01/31/19 0400 01/31/19 0500  BP: 124/88 127/86    Pulse: 71 69 70 67  Resp: 12 13 15 15   Temp:   99.5 F (37.5 C)   TempSrc:   Oral   SpO2: 96% 97% 95% 93%  Weight:      Height:        Intake/Output Summary (Last 24 hours) at 01/31/2019 0641 Last data filed at 01/30/2019 2335 Gross per 24 hour  Intake 240 ml  Output 2519 ml  Net -2279 ml   Filed Weights   01/28/19 0500 01/29/19 0500 01/30/19 0500  Weight: 106.4 kg 109.2 kg 109.5 kg    Telemetry    Unremarkable- Personally Reviewed  ECG    None today- Personally Reviewed  Physical Exam   GEN: No acute distress.   Cardiac: RRR, no murmurs, rubs, or gallops.  Respiratory: Clear to auscultation bilaterally. Skin: Surgical incision is clean MS: No edema; No deformity. Neuro:  Nonfocal  Psych: Normal affect   Labs    Chemistry Recent  Labs  Lab 01/29/19 0306 01/30/19 0239 01/31/19 0251  NA 137 134* 136  K 4.6 4.2 4.0  CL 107 103 104  CO2 23 22 23   GLUCOSE 127* 121* 180*  BUN 47* 50* 44*  CREATININE 4.25* 3.98* 3.36*  CALCIUM 8.5* 8.4* 8.5*  ALBUMIN 2.2* 2.2* 2.2*  GFRNONAA 15* 16* 19*  GFRAA 17* 18* 23*  ANIONGAP 7 9 9      Hematology Recent Labs  Lab 01/29/19 0306 01/30/19 0239 01/31/19 0251  WBC 11.0* 9.6 7.8  RBC 2.63* 2.69* 2.88*  HGB 8.0* 8.1* 8.5*  HCT 24.8* 25.0* 26.7*  MCV 94.3 92.9 92.7  MCH 30.4 30.1 29.5  MCHC 32.3 32.4 31.8  RDW 14.2 13.9 13.7  PLT 177 196 250    Cardiac EnzymesNo results for input(s): TROPONINI in the last 168 hours. No results for input(s): TROPIPOC in the last 168 hours.   BNPNo results for input(s): BNP, PROBNP in the last 168 hours.   DDimer No results for input(s): DDIMER in the last 168 hours.    Radiology    Dg Chest 2 View  Result Date: 01/31/2019 CLINICAL DATA:  History of CABG EXAM: CHEST - 2 VIEW COMPARISON:  Two days ago FINDINGS: Mild linear atelectasis at the left base with bilateral trace effusion. The sheath has been removed. Stable heart size. CABG. IMPRESSION: Stable atelectasis and trace pleural fluid. Electronically Signed   By: Monte Fantasia M.D.   On: 01/31/2019 06:21    Cardiac Studies   Cath: 01/21/19  Failed PCI of mid LAD due to failure to cross with wire because of tortuosity and calcification.  RECOMMENDATION:   Sequential off-pump LIMA to LAD diagonal if possible.  High risk for kidney failure requiring dialysis either as a result of contrast used today, 90 cc or certainly after bypass surgery.   01/27/2019-echocardiogram  POST-OP IMPRESSIONS - Left Ventricle: The left ventricle is unchanged from pre-bypass. - Right Ventricle: The right ventricle appears unchanged from pre-bypass. - Aorta: The aorta appears unchanged from pre-bypass. - Left Atrium: The left atrium appears unchanged from pre-bypass. - Left Atrial Appendage: The left atrial appendage appears unchanged from pre-bypass. - Aortic Valve: The aortic valve appears unchanged from pre-bypass. - Mitral Valve: The mitral valve appears unchanged from pre-bypass. - Tricuspid Valve: The tricuspid valve appears unchanged from pre-bypass. - Interatrial Septum: The interatrial septum appears unchanged from pre-bypass. - Interventricular Septum: The interventricular septum appears unchanged from pre-bypass. - Pericardium: The pericardium appears unchanged from pre-bypass.  PRE-OP FINDINGS Left Ventricle: The left ventricle has normal systolic function, with an ejection fraction of 55-60%. The cavity size was normal. There is no increase in left ventricular wall thickness. Right Ventricle: The right ventricle has normal systolic function. The cavity was normal. There is no increase in  right ventricular wall thickness. Left Atrium: Left atrial size was normal in size. Right Atrium: Right atrial size was normal in size. Right atrial pressure is estimated at 10 mmHg. Interatrial Septum: No atrial level shunt detected by color flow Doppler. Pericardium: There is no evidence of pericardial effusion. Mitral Valve: The mitral valve is normal in structure. Mitral valve regurgitation is mild by color flow Doppler. The MR jet is centrally-directed. Tricuspid Valve: The tricuspid valve was normal in structure. Tricuspid valve regurgitation is mild by color flow Doppler. Aortic Valve: The aortic valve is tricuspid Aortic valve regurgitation was not visualized by color flow Doppler. There is no stenosis of the aortic valve. Pulmonic Valve: The pulmonic valve was  not well visualized. Pulmonic valve regurgitation is trivial by color flow Doppler. Aorta: The aortic root, ascending aorta and aortic arch are normal in size and structure.  Patient Profile     53 y.o.malewith a history of hypertension, uncontrolled diabetes mellitus, hyperlipidemia, mild PAD, CKD stage III with proteinuria, CAD with silent ischemias/[facility] concerning for myocardial infarction as initial EKG reviewed T wave inversion and minimal ST elevation at the inferior lateral leads.  He underwent coronary angiography on 01/20/2019 which revealed 90% stenosis of mid LAD however he had failed PCI due to failure to cross wire from tortuosity and calcification.  He is now status post CABG x2  Assessment & Plan    #CAD/Aborted Lateral MI: #Status post CABG x2 POD 4 Is doing very well and has no complaints this morning. -Continue aspirin, Lipitor, metoprolol  #Acute on chronic kidney disease stage III-IV #Nephrotic syndrome Renal function improving.  Serum creatinine today 3.3 from 3.9 yesterday -Greatly appreciate nephrology assistance   #Hypertension BP stable.  In the 120s/80s -Continue metoprolol, tamsulosin  #Hyperlipidemia -Continue Lipitor  #Urinary retention  He was found to have high postvoid residuals however was able to urinate without catheterization.  #Uncontrolled diabetes mellitus -Continue insulin -Continue to monitor CBG     For questions or updates, please contact [place] Please consult [email or web address] for contact info under Cardiology/STEMI.      Signed, Jean Rosenthal, MD  01/31/2019, 6:41 AM

## 2019-01-31 NOTE — Progress Notes (Signed)
CARDIAC REHAB PHASE I   Post-op education completed with pt. Pt educated on importance of showers and monitoring incisions daily. Encouraged continued IS use, walks, and sternal precautions. Pt given in-the-tube sheet, along with heart healthy and diabetic diets. Reviewed restrictions and site care. Will refer to CRP II [place]. At end of session, pt helped to BR, assist to stand and hand held assist to walk. BR call light given, RN aware.  [place], RN BSN 01/31/2019 2:44 PM

## 2019-01-31 NOTE — Progress Notes (Signed)
Follow up visit to last week when assisting Chase Clark with his Advance Directive.  He was alert and in good spirits.  We have a nice, but brief spiritual conversation about planting seeds and hoping they grow.  [place] Chaplain Resident Pager:  [phone]

## 2019-01-31 NOTE — Progress Notes (Signed)
Physical Therapy Treatment Patient Details Name: Chase Clark MRN: [id number] DOB: 11-Sep-1965 Today's Date: 01/31/2019    History of Present Illness 53 y.o. male with a history of hypertension, uncontrolled diabetes mellitus, hyperlipidemia, mild PAD, CKD stage III with proteinuria, CAD with silent ischemia and prior L BKA admitted with STEMI.  He underwent coronary angiography on 01/20/2019 which revealed 90% stenosis of mid LAD however he had failed PCI due to failure to cross wire from tortuosity and calcification.  He is now status post CABG x2 and has had some post op renal failure.    PT Comments    Pt tolerated treatment well, requiring less PT assistance for bed mobility, transfers, and ambulation. Pt with steady step through gait while utilizing RW, however pt independent without assistive device at baseline and will benefit from gait training without device next session. Pt also requires verbal cues and reinforcement to reduce pushing through UE during bed mobility to maintain sternal precautions.   Follow Up Recommendations  Outpatient PT(outpatient cardiac rehab)     Equipment Recommendations  3in1 (PT)    Recommendations for Other Services       Precautions / Restrictions Precautions Precautions: Fall;Sternal Required Braces or Orthoses: Other Brace Other Brace: L LE prosthesis Restrictions Weight Bearing Restrictions: No    Mobility  Bed Mobility Overal bed mobility: Modified Independent             General bed mobility comments: HOB elevated, pt owns tempurpedic  Transfers Overall transfer level: Needs assistance Equipment used: Rolling walker (2 wheeled) Transfers: Sit to/from Stand Sit to Stand: Min guard         General transfer comment: cues to rock and reduce use of UE to power up  Ambulation/Gait Ambulation/Gait assistance: Supervision Gait Distance (Feet): 300 Feet Assistive device: Rolling walker (2 wheeled) Gait Pattern/deviations:  Step-through pattern Gait velocity: functional HH ambulator Gait velocity interpretation: 1.31 - 2.62 ft/sec, indicative of limited community ambulator General Gait Details: pt with steady step through gait. pt ambulates without assistive device at baseline   Stairs             Wheelchair Mobility    Modified Rankin (Stroke Patients Only)       Balance Overall balance assessment: Needs assistance Sitting-balance support: No upper extremity supported;Feet supported Sitting balance-Leahy Scale: Good     Standing balance support: Single extremity supported Standing balance-Leahy Scale: Good Standing balance comment: supervision with single UE support                            Cognition Arousal/Alertness: Awake/alert Behavior During Therapy: WFL for tasks assessed/performed Overall Cognitive Status: Within Functional Limits for tasks assessed                                        Exercises      General Comments        Pertinent Vitals/Pain Pain Assessment: No/denies pain    Home Living                      Prior Function            PT Goals (current goals can now be found in the care plan section) Acute Rehab PT Goals Patient Stated Goal: To get stronger, go home Progress towards PT goals: Progressing toward goals  Frequency    Min 3X/week      PT Plan Current plan remains appropriate    Co-evaluation              AM-PAC PT "6 Clicks" Mobility   Outcome Measure  Help needed turning from your back to your side while in a flat bed without using bedrails?: None Help needed moving from lying on your back to sitting on the side of a flat bed without using bedrails?: None Help needed moving to and from a bed to a chair (including a wheelchair)?: A Little Help needed standing up from a chair using your arms (e.g., wheelchair or bedside chair)?: A Little Help needed to walk in hospital room?: None Help  needed climbing 3-5 steps with a railing? : A Lot 6 Click Score: 20    End of Session Equipment Utilized During Treatment: Gait belt Activity Tolerance: Patient tolerated treatment well Patient left: in bed;with call bell/phone within reach Nurse Communication: Mobility status PT Visit Diagnosis: Other abnormalities of gait and mobility (R26.89)     Time: 5366-4403 PT Time Calculation (min) (ACUTE ONLY): 14 min  Charges:  $Gait Training: 8-22 mins                     Zenaida Niece, PT, DPT Acute Rehabilitation Pager: [phone]    Zenaida Niece 01/31/2019, 11:55 AM

## 2019-01-31 NOTE — TOC Initial Note (Signed)
Transition of Care [facility]) - Initial/Assessment Note    Patient Details  Name: Chase Clark MRN: [id number] Date of Birth: [birth year]  [facility] CM/SW Contact:    Bethena Roys, RN Phone Number: 01/31/2019, 2:06 PM  Clinical Narrative: Pt a transfer from 2 C to 2 Heart. Post CABG. PTA From home with support of wife. Plan will be to return home. No DME needs. Recommendations for 3n1- wife states she has purchased a riser. PT recommendations for cardiac rehab- CM did discuss with staff RN and she will contact cardiac rehab to follow up outpatient. Patient has PCP and wife states patient gets medications and is adherent to regimen. Wife to transport home. No further needs identified from CM at this time.                   Expected Discharge Plan: Home/Self Care Barriers to Discharge: No Barriers Identified   Patient Goals and CMS Choice Patient states their goals for this hospitalization and ongoing recovery are:: "to return home"   Choice offered to / list presented to : NA  Expected Discharge Plan and Services Expected Discharge Plan: Home/Self Care In-house Referral: NA Discharge Planning Services: CM Consult Post Acute Care Choice: NA Living arrangements for the past 2 months: Apartment                 HH Arranged: NA    Prior Living Arrangements/Services Living arrangements for the past 2 months: Apartment Lives with:: Spouse Patient language and need for interpreter reviewed:: Yes Do you feel safe going back to the place where you live?: Yes      Need for Family Participation in Patient Care: Yes (Comment) Care giver support system in place?: Yes (comment)   Criminal Activity/Legal Involvement Pertinent to Current Situation/Hospitalization: No - Comment as needed  Activities of Daily Living Home Assistive Devices/Equipment: Prosthesis ADL Screening (condition at time of admission) Patient's cognitive ability adequate to safely complete  daily activities?: Yes Is the patient deaf or have difficulty hearing?: No Does the patient have difficulty seeing, even when wearing glasses/contacts?: No Does the patient have difficulty concentrating, remembering, or making decisions?: No Patient able to express need for assistance with ADLs?: Yes Does the patient have difficulty dressing or bathing?: No Independently performs ADLs?: Yes (appropriate for developmental age) Does the patient have difficulty walking or climbing stairs?: No Weakness of Legs: None Weakness of Arms/Hands: None  Permission Sought/Granted Permission sought to share information with : Family Supports                Emotional Assessment Appearance:: Appears stated age Attitude/Demeanor/Rapport: Engaged Affect (typically observed): Accepting Orientation: : Oriented to Situation, Oriented to  Time, Oriented to Place, Oriented to Self Alcohol / Substance Use: Not Applicable Psych Involvement: No (comment)  Admission diagnosis:  Precordial chest pain [R07.2] SOB (shortness of breath) [R06.02] AKI (acute kidney injury) ([place]) [N17.9] Unstable angina ([place]) [I20.0] Patient Active Problem List   Diagnosis Date Noted  . 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 AKI (acute kidney injury) ([place])   . Chest pain 01/19/2019  . Nephrotic syndrome 01/19/2019  . Unstable angina ([place]) 01/19/2019  . Allergic rhinitis 07/19/2018  . Abnormality of gait 12/02/2017  . Labile blood pressure   . Labile blood glucose   . Amputation of left lower extremity below knee ([place]) 09/09/2017  . Benign prostatic hyperplasia   . Hyperlipidemia   . Stage 3 chronic kidney disease   .  Neuropathic pain   . Osteomyelitis of left foot ([place])   . Coronary artery disease involving native coronary artery of native heart without angina pectoris   . Anemia of chronic disease   . Leukocytosis   . Pressure injury of skin 08/24/2017  . Cervical spondylosis with myelopathy and radiculopathy 05/07/2017  .  Renal failure 04/30/2017  . Fatty liver 04/04/2017  . DDD (degenerative disc disease), cervical 01/13/2017  . PAD (peripheral artery disease) ([place]) 08/07/2016  . Ulcer of ankle ([place]) 08/07/2016  . Chronic diarrhea 05/17/2016  . Hyponatremia 05/17/2016  . Anemia 01/12/2014  . Disorder of ligament of ankle 07/31/2013  . Edema 06/28/2013  . 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 Obesity (BMI 30-39.9) 06/12/2013  . Pain in joint, shoulder region 06/12/2013  . Statin intolerance 06/10/2013  . DM (diabetes mellitus), type 2, uncontrolled w/ophthalmic complication ([place]) 58/59/2924  . Hyperlipidemia associated with type 2 diabetes mellitus ([place]) 07/17/2006  . GILBERT'S SYNDROME 07/17/2006  . Essential hypertension 07/17/2006  . Coronary atherosclerosis 07/17/2006  . OSTEOARTHRITIS 07/17/2006  . COUGH 07/17/2006   PCP:  [name], MD Pharmacy:   [id number] - [place], [place] [address] [place] [address] Phone: [phone] Fax: [phone]     Social Determinants of Health (SDOH) Interventions    Readmission Risk Interventions Readmission Risk Prevention Plan 01/31/2019  Medication Review ([place]) Complete  PCP or Specialist appointment within 3-5 days of discharge Complete  HRI or [place] Complete  SW Recovery Care/Counseling Consult (No Data)  [place] Not Applicable  Some recent data might be hidden

## 2019-01-31 NOTE — Progress Notes (Signed)
Patient ID: Chase Clark, male   DOB: [birth year], 53 y.o.   MRN: [id number] S: 2500 UOP on his own this AM after straight cath last night  -  crt down again    O:BP 127/86   Pulse 67   Temp 99.5 F (37.5 C) (Oral)   Resp 15   Ht 5\' 10"  (1.778 m)   Wt 109.5 kg   SpO2 93%   BMI 34.64 kg/m   Intake/Output Summary (Last 24 hours) at 01/31/2019 0634 Last data filed at 01/30/2019 2335 Gross per 24 hour  Intake 240 ml  Output 2519 ml  Net -2279 ml   Intake/Output: I/O last 3 completed shifts: In: 74 [P.O.:720] Out: 3154 [Urine:3139; Chest Tube:15]  Intake/Output this shift:  Total I/O In: -  Out: 1000 [Urine:1000] Weight change:  Gen: NAD CVS: no rub Resp: cta Abd: +BS, soft, NT/ND Ext: pitting  right lower ext edema, s/p LBKA  Recent Labs  Lab 01/25/19 0223 01/26/19 0255 01/27/19 0554 01/27/19 1154  01/27/19 1715 01/27/19 1822 01/28/19 0300 01/28/19 1653 01/29/19 0306 01/30/19 0239 01/31/19 0251  NA 135 136 138 137   < > 138 137 136 134* 137 134* 136  K 4.3 4.3 4.2 3.9   < > 4.6 4.7 4.9 4.7 4.6 4.2 4.0  CL 99 103 103  --   --   --  105 107 105 107 103 104  CO2 23 24 26   --   --   --  22 21* 19* 23 22 23   GLUCOSE 132* 199* 139* 110*  --   --  120* 102* 132* 127* 121* 180*  BUN 44* 43* 43*  --   --   --  39* 43* 45* 47* 50* 44*  CREATININE 5.48* 4.97* 4.45*  --   --   --  4.21* 4.46* 4.27* 4.25* 3.98* 3.36*  ALBUMIN 2.6* 2.4* 2.8*  --   --   --   --   --   --  2.2* 2.2* 2.2*  CALCIUM 8.9 8.7* 9.0  --   --   --  8.7* 8.3* 8.4* 8.5* 8.4* 8.5*  PHOS 5.2* 5.1* 5.0*  --   --   --   --   --   --  5.2* 4.4 3.6   < > = values in this interval not displayed.   Liver Function Tests: Recent Labs  Lab 01/29/19 0306 01/30/19 0239 01/31/19 0251  ALBUMIN 2.2* 2.2* 2.2*   No results for input(s): LIPASE, AMYLASE in the last 168 hours. No results for input(s): AMMONIA in the last 168 hours. CBC: Recent Labs  Lab 01/28/19 0300 01/28/19 1653 01/29/19 0306  01/30/19 0239 01/31/19 0251  WBC 11.2* 13.8* 11.0* 9.6 7.8  HGB 8.4* 9.2* 8.0* 8.1* 8.5*  HCT 26.8* 27.3* 24.8* 25.0* 26.7*  MCV 93.7 91.6 94.3 92.9 92.7  PLT 175 190 177 196 250   Cardiac Enzymes: No results for input(s): CKTOTAL, CKMB, CKMBINDEX, TROPONINI in the last 168 hours. CBG: Recent Labs  Lab 01/29/19 2115 01/30/19 0646 01/30/19 1147 01/30/19 1658 01/30/19 2131  GLUCAP 149* 147* 196* 210* 242*    Iron Studies: No results for input(s): IRON, TIBC, TRANSFERRIN, FERRITIN in the last 72 hours. Studies/Results: Dg Chest 2 View  Result Date: 01/31/2019 CLINICAL DATA:  History of CABG EXAM: CHEST - 2 VIEW COMPARISON:  Two days ago FINDINGS: Mild linear atelectasis at the left base with bilateral trace effusion. The sheath has been removed. Stable heart size.  CABG. IMPRESSION: Stable atelectasis and trace pleural fluid. Electronically Signed   By: Monte Fantasia M.D.   On: 01/31/2019 06:21   . acetaminophen  1,000 mg Oral Q6H   Or  . acetaminophen (TYLENOL) oral liquid 160 mg/5 mL  1,000 mg Per Tube Q6H  . aspirin EC  325 mg Oral Daily   Or  . aspirin  324 mg Per Tube Daily  . atorvastatin  80 mg Oral q1800  . bisacodyl  10 mg Oral Daily   Or  . bisacodyl  10 mg Rectal Daily  . Chlorhexidine Gluconate Cloth  6 each Topical Daily  . docusate sodium  200 mg Oral Daily  . enoxaparin (LOVENOX) injection  30 mg Subcutaneous QHS  . furosemide  40 mg Intravenous Daily  . gabapentin  300 mg Oral QPC breakfast  . insulin aspart  0-24 Units Subcutaneous TID AC & HS  . insulin glargine  20 Units Subcutaneous Daily  . metoCLOPramide (REGLAN) injection  5 mg Intravenous Q6H  . metoprolol tartrate  25 mg Oral BID  . pantoprazole  40 mg Oral Daily  . sodium chloride flush  3 mL Intravenous Q12H  . sorbitol  30 mL Oral q morning - 10a  . tamsulosin  0.4 mg Oral Daily    BMET    Component Value Date/Time   NA 136 01/31/2019 0251   K 4.0 01/31/2019 0251   K 4.5 12/29/2013  1521   CL 104 01/31/2019 0251   CO2 23 01/31/2019 0251   GLUCOSE 180 (H) 01/31/2019 0251   BUN 44 (H) 01/31/2019 0251   CREATININE 3.36 (H) 01/31/2019 0251   CREATININE 2.42 (H) 12/31/2018 1454   CALCIUM 8.5 (L) 01/31/2019 0251   GFRNONAA 19 (L) 01/31/2019 0251   GFRAA 23 (L) 01/31/2019 0251   CBC    Component Value Date/Time   WBC 7.8 01/31/2019 0251   RBC 2.88 (L) 01/31/2019 0251   HGB 8.5 (L) 01/31/2019 0251   HCT 26.7 (L) 01/31/2019 0251   HCT 24.9 (L) 09/04/2017 2358   PLT 250 01/31/2019 0251   MCV 92.7 01/31/2019 0251   MCH 29.5 01/31/2019 0251   MCHC 31.8 01/31/2019 0251   RDW 13.7 01/31/2019 0251   LYMPHSABS 1.7 09/15/2017 0532   MONOABS 0.6 09/15/2017 0532   EOSABS 0.5 09/15/2017 0532   BASOSABS 0.1 09/15/2017 0532    Assessment/Plan:  1. 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  1. Cr appears to have peaked and now improving- 3.3 today  2. No indication for dialysis  3. Prior large PVR's- now seems better. On flomax 2. ACS- s/p cath with severe LAD and failed attempt for PCI now s/p CABG.  1. S/pCABGx 2 (LIMA-LAD, RSVG-D1)01/27/19 3. HTN/volume- variable but stable. lopressor 25 BID and lasix 40 mg iv daily  4. CHF- good UOP - low dose lasix after surgery.Will continue for now 5. Anemia of CKD with some blood loss. Transfuse as needed. Will add ESA  6. Constipation- Avoid mg citrate and fleets enemas due to AKI. Improved   [name]  [employer] [phone]

## 2019-02-04 ENCOUNTER — Telehealth: Payer: Self-pay

## 2019-02-04 ENCOUNTER — Ambulatory Visit: Payer: Self-pay | Admitting: Thoracic Surgery (Cardiothoracic Vascular Surgery)

## 2019-02-04 NOTE — Telephone Encounter (Signed)
Pt's wife called office reporting that pt's BP was 156/101 this morning. He has not had f/u w/ cardiology since hospital d/c. Dr. Kipp Brood made aware and states he will call pt. Informed wife.

## 2019-02-07 ENCOUNTER — Encounter: Payer: Self-pay | Admitting: Thoracic Surgery (Cardiothoracic Vascular Surgery)

## 2019-02-07 ENCOUNTER — Ambulatory Visit (INDEPENDENT_AMBULATORY_CARE_PROVIDER_SITE_OTHER): Payer: Self-pay | Admitting: Thoracic Surgery (Cardiothoracic Vascular Surgery)

## 2019-02-07 ENCOUNTER — Other Ambulatory Visit: Payer: Self-pay

## 2019-02-07 VITALS — BP 117/80 | HR 56 | Temp 97.7°F | Resp 20 | Ht 70.0 in | Wt 229.0 lb

## 2019-02-07 DIAGNOSIS — I2510 Atherosclerotic heart disease of native coronary artery without angina pectoris: Secondary | ICD-10-CM

## 2019-02-07 DIAGNOSIS — Z951 Presence of aortocoronary bypass graft: Secondary | ICD-10-CM

## 2019-02-07 NOTE — Progress Notes (Signed)
      [address]       [place] [address]             [phone]        [place] Medical Record #[id number] Date of Birth: 12-21-1965  Referring: Chase Spray, MD Primary Care: Chase Haven, MD Primary Cardiologist:No primary care provider on file.  Reason for visit:   follow-up  History of Present Illness:     [name] presents for his 1st follow-up appointment.  He has done well.  He continues to have some visual disturbances, but these have improved  Physical Exam: BP 117/80   Pulse (!) 56   Temp 97.7 F (36.5 C) (Skin)   Resp 20   Ht 5\' 10"  (1.778 m)   Wt 229 lb (103.9 kg)   SpO2 95% Comment: RA  BMI 32.86 kg/m   Alert NAD Incision clean.  Sternum stable Abdomen soft, NT/ND trace peripheral edema      Assessment / Plan:   53 yo male s/p CABG. Doing well Will f/u in 1 month   Lajuana Matte 02/07/2019 3:58 PM

## 2019-02-10 DIAGNOSIS — E113513 Type 2 diabetes mellitus with proliferative diabetic retinopathy with macular edema, bilateral: Secondary | ICD-10-CM | POA: Diagnosis not present

## 2019-02-11 ENCOUNTER — Encounter: Payer: BC Managed Care – PPO | Attending: Cardiology | Admitting: *Deleted

## 2019-02-11 ENCOUNTER — Telehealth: Payer: Self-pay | Admitting: Nurse Practitioner

## 2019-02-11 ENCOUNTER — Other Ambulatory Visit: Payer: Self-pay

## 2019-02-11 DIAGNOSIS — Z951 Presence of aortocoronary bypass graft: Secondary | ICD-10-CM

## 2019-02-11 NOTE — Telephone Encounter (Signed)
° ° °  Spouse requesting to accompany patient, stating he is an amputee. Spouse is a Marine scientist and wants to hear information given

## 2019-02-11 NOTE — Telephone Encounter (Signed)
Made wife aware that she may come up with the patient per Truitt Merle, NP.

## 2019-02-11 NOTE — Progress Notes (Signed)
Initial Orientation phone call completed. Diagnosis can be found in [facility] 10/7. EP orientation scheduled for 12/1 at 8am

## 2019-02-13 NOTE — Progress Notes (Addendum)
CARDIOLOGY OFFICE NOTE  Date:  02/14/2019    [name] Date of Birth: 08-26-1965 Medical Record #[id number]  PCP:  [name], MD  Cardiologist:  Fath/Jordan   Chief Complaint  Patient presents with  . Follow-up    History of Present Illness: Chase [place] is a 53 y.o. male who presents today for a post hospital visit. He is a patient of Dr. Bethanne Ginger with [facility].   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  Last month he presented to the ED at [facility] with left shoulder and arm pain. Seen by Dr. Martinique.  He was ruled in for STEMI, and taken to the cath lab by Dr. Gwenlyn Found where he was noted to have LAD disease. Then underwent attempt at PCI to the mid LAD by Dr. Tamala Julian but due to tortuosity and calcifications, wires failed to cross the lesion. He was then referred to [facility] for consideration of CABG with Dr. Wyvonnia Lora.   On 01/27/2019 he underwent a CABG x 2 off pump with Dr. Kipp Brood. Surgery was tolerated well. He did have some blurred vision in the left eye post op which improved - noted this may have been a chronic issue as well. He was seen with nephrology due to known CKD. Required Flomax for urinary retention.   The patient and his wife do not have symptoms concerning for COVID-19 infection (fever, chills, cough, or new shortness of breath).   Comes in today. Here with his wife due to the vision issue. Doing ok. He had issues with his eyesight prior to this most recent event especially with the right eye. After his initial cath - noted worsening changes on the left. This is improving. He has seen ophthalmology - been told to get his disability papers in order. Having injections and will have another in 4 weeks. Hopeful that this will help but patient notes it is doubtful. He is eating ok. Sleeping in the bed. Bowels are  working. His BP is trending up - not back on all HTN meds as he was taking pre op. Was on high dose Norvasc along with Hydralazine (she thought 75 mg TID). Seeing nephrology later this week .Some swelling in the vein harvesting leg. Notes that his belly feels more full He weighed 220 back in August with PCP. He is walking some a few times a day - the sunlight makes it difficult for him. No fever. No cough. Incision is ok. No palpitations. Breathing is ok. Mostly using Tylenol for pain. Not driving.   Past Medical History:  Diagnosis Date  . Arthritis   . Asthma    as child  . Coronary artery disease    DES DIAG1 11/20/09 [facility])  . Diabetes mellitus without complication ([place])   . Diabetic osteomyelitis ([place])   . Diabetic ulcer of foot with muscle involvement without evidence of necrosis ([place])    DIABETIC ULCERATIONS ASSOCIATED WITH IRRITATION LATERAL ANKLE LEFT GREATER THAN RIGHT WITH MILD CELLULITIS   . Diverticulosis   . DKA (diabetic ketoacidoses) ([place]) 08/23/2017  . Edema, lower extremity   . Fatty liver   . GERD (gastroesophageal reflux disease)   . Gilbert's disease   . Glaucoma   . Heart disease 2011   Patient has stent for 80% blockage  . Hyperlipidemia   . Hypertension   .  Neuropathy   . Seasonal allergies   . Shoulder pain   . Ulcer of foot ([place]) 08.06.2014   DIABETIC ULCERATIONS ASSOCIATED WITH IRRITATION LATERAL ANKLE LEFT GREATER THAN RIGHT WITH MILD CELLULITIS    Past Surgical History:  Procedure Laterality Date  . ABDOMINAL AORTOGRAM N/A 05/20/2016   Procedure: Abdominal Aortogram possible intervention;  Surgeon: Katha Cabal, MD;  Location: [place] [facility];  Service: Cardiovascular;  Laterality: N/A;  . AMPUTATION Left 09/05/2017   Procedure: AMPUTATION BELOW KNEE;  Surgeon: Tania Ade, MD;  Location: [place];  Service: Orthopedics;  Laterality: Left;  . ANTERIOR CERVICAL DECOMP/DISCECTOMY FUSION N/A 05/07/2017   Procedure: ANTERIOR CERVICAL  DECOMPRESSION/DISCECTOMY Luevenia Maxin PROSTHESIS,PLATE/SCREWS CERVICAL FIVE - CERVICAL SIX;  Surgeon: Newman Pies, MD;  Location: [place];  Service: Neurosurgery;  Laterality: N/A;  . CARDIAC CATHETERIZATION    . CATARACT EXTRACTION W/ INTRAOCULAR LENS IMPLANT    . CATARACT EXTRACTION W/PHACO Left 02/26/2017   Procedure: CATARACT EXTRACTION PHACO AND INTRAOCULAR LENS PLACEMENT (IOC);  Surgeon: Eulogio Bear, MD;  Location: [facility];  Service: Ophthalmology;  Laterality: Left;  Lot # [id number] H [place]: 00:25.3 AP%: 6.2 CDE: 1.59   . CHOLECYSTECTOMY N/A   . CORONARY ANGIOPLASTY WITH STENT PLACEMENT  2007   1st diagonal  . CORONARY ARTERY BYPASS GRAFT N/A 01/27/2019   Procedure: OFF PUMP CORONARY ARTERY BYPASS GRAFTING (CABG) X 2 WITH ENDOSCOPIC HARVESTING OF RIGHT GREATER SAPHENOUS VEIN. LIMA TO LAD;  Surgeon: Lajuana Matte, MD;  Location: [place];  Service: Open Heart Surgery;  Laterality: N/A;  . CORONARY STENT INTERVENTION N/A 01/21/2019   Procedure: CORONARY STENT INTERVENTION;  Surgeon: Belva Crome, MD;  Location: [place] [facility];  Service: Cardiovascular;  Laterality: N/A;  . EPIBLEPHERON REPAIR WITH TEAR DUCT PROBING    . EYE SURGERY Right sept 29n2015   cataract extraction  . INSERTION EXPRESS TUBE SHUNT Right 09/06/2015   Procedure: INSERTION AHMED TUBE SHUNT with tutoplast allograft;  Surgeon: Eulogio Bear, MD;  Location: [facility];  Service: Ophthalmology;  Laterality: Right;  . LEFT HEART CATH AND CORONARY ANGIOGRAPHY N/A 01/20/2019   Procedure: LEFT HEART CATH AND CORONARY ANGIOGRAPHY;  Surgeon: Lorretta Harp, MD;  Location: [place] [facility];  Service: Cardiovascular;  Laterality: N/A;  . TEE WITHOUT CARDIOVERSION N/A 01/27/2019   Procedure: TRANSESOPHAGEAL ECHOCARDIOGRAM (TEE);  Surgeon: Lajuana Matte, MD;  Location: [place];  Service: Open Heart Surgery;  Laterality: N/A;  . VASECTOMY       Medications: Current Meds  Medication Sig  .  acetaminophen (TYLENOL) 325 MG tablet Take 1 tablet (325 mg total) by mouth every 6 (six) hours as needed for mild pain (pain score 1-3 or temp > 100.5).  Marland Kitchen aspirin 325 MG EC tablet Take 1 tablet (325 mg total) by mouth daily.  Marland Kitchen atorvastatin (LIPITOR) 80 MG tablet Take 1 tablet (80 mg total) by mouth daily at 6 PM.  . brimonidine-timolol (COMBIGAN) 0.2-0.5 % ophthalmic solution Place 1 drop into the right eye every 12 (twelve) hours.   Marland Kitchen esomeprazole (NEXIUM) 20 MG capsule Take 20 mg by mouth daily.   Marland Kitchen gabapentin (NEURONTIN) 300 MG capsule Take 1 capsule (300 mg total) by mouth 2 (two) times daily.  . insulin aspart (NOVOLOG) 100 UNIT/ML injection Inject up to 15 units up to 3 times daily with meals  . Insulin Glargine (LANTUS) 100 UNIT/ML Solostar Pen Inject 40 Units into the skin daily. (Patient taking differently: Inject 40 Units into  the skin 2 (two) times daily. )  . Insulin Pen Needle 32G X 4 MM MISC Inject 5 pens into the skin daily. Use to inject Lantus and Novolog up to 5 times daily  . latanoprost (XALATAN) 0.005 % ophthalmic solution Place 1 drop into both eyes at bedtime.  . metoprolol tartrate (LOPRESSOR) 50 MG tablet Take 50 mg by mouth 2 (two) times daily.  Marland Kitchen oxyCODONE (OXY IR/ROXICODONE) 5 MG immediate release tablet Take 1 tablet (5 mg total) by mouth every 6 (six) hours as needed for severe pain.  . tamsulosin (FLOMAX) 0.4 MG CAPS capsule Take 1 capsule (0.4 mg total) by mouth daily.  . [DISCONTINUED] metoprolol tartrate (LOPRESSOR) 25 MG tablet Take 1 tablet (25 mg total) by mouth 2 (two) times daily. (Patient taking differently: Take 50 mg by mouth 2 (two) times daily. )     Allergies: No Known Allergies  Social History: The patient  reports that he quit smoking about 10 years ago. His smoking use included cigarettes. He has never used smokeless tobacco. He reports current alcohol use. He reports that he does not use drugs.   Family History: The patient's family history  includes Diabetes in his father and mother; Heart disease in his father; Hyperlipidemia in his father and mother; Hypertension in his father; Liver disease in his mother.   Review of Systems: Please see the history of present illness.   All other systems are reviewed and negative.   Physical Exam: VS:  BP (!) 160/90 (BP Location: Left Arm, Patient Position: Sitting, Cuff Size: Normal)   Pulse 63   Ht 5\' 10"  (1.778 m)   Wt 231 lb 12.8 oz (105.1 kg)   BMI 33.26 kg/m  .  BMI Body mass index is 33.26 kg/m.  Wt Readings from Last 3 Encounters:  02/14/19 231 lb 12.8 oz (105.1 kg)  02/07/19 229 lb (103.9 kg)  01/31/19 227 lb 4.7 oz (103.1 kg)    General: Alert and in no acute distress. Has sunglasses on. Color is a little sallow. Using a cane.    HEENT: Normal.  Neck: Supple, no JVD, carotid bruits, or masses noted.  Cardiac: Regular rate and rhythm. No murmurs, rubs, or gallops. Right leg pretty tight and swollen. L BKA. Sternotomy incision ok with some delayed healing.  Respiratory:  Lungs are clear to auscultation bilaterally with normal work of breathing.  GI: Soft and nontender.  MS: No deformity or atrophy. Gait and ROM intact. Using a cane.  Skin: Warm and dry. Color is normal.  Neuro:  Strength and sensation are intact and no gross focal deficits noted.  Psych: Alert, appropriate and with normal affect.   LABORATORY DATA:  EKG:  EKG is ordered today. This demonstrates NSR with inferolateral T wave changes. HR is 63.   Lab Results  Component Value Date   WBC 7.8 01/31/2019   HGB 8.5 (L) 01/31/2019   HCT 26.7 (L) 01/31/2019   PLT 250 01/31/2019   GLUCOSE 180 (H) 01/31/2019   CHOL 145 01/19/2019   TRIG 151 (H) 01/19/2019   HDL 43 01/19/2019   LDLDIRECT 130.0 12/17/2018   LDLCALC 72 01/19/2019   ALT 14 01/20/2019   AST 18 01/20/2019   NA 136 01/31/2019   K 4.0 01/31/2019   CL 104 01/31/2019   CREATININE 3.36 (H) 01/31/2019   BUN 44 (H) 01/31/2019   CO2 23  01/31/2019   TSH 3.13 12/02/2017   PSA 0.16 05/04/2017   INR 1.3 (H) 01/27/2019  HGBA1C 9.3 (H) 01/19/2019   MICROALBUR 13.5 (H) 01/06/2014     BNP (last 3 results) No results for input(s): BNP in the last 8760 hours.  ProBNP (last 3 results) No results for input(s): PROBNP in the last 8760 hours.   Other Studies Reviewed Today:  Significant Diagnostic Studies   Procedure: Off pump CABG X2. LIMA LAD, RSVG D1 Endoscopic greater saphenous vein harvest on theright Intra-operative Transesophageal Echocardiogram    PCI  01/21/2019:   Failed PCI of mid LAD due to failure to cross with wire because of tortuosity and calcification.   Left Anterior Descending  Vessel is small.  Prox LAD lesion 70% stenosed  Prox LAD lesion is 70% stenosed.  Prox LAD to Mid LAD lesion 45% stenosed  Prox LAD to Mid LAD lesion is 45% stenosed.  Mid LAD lesion 90% stenosed  Mid LAD lesion is 90% stenosed.  First Diagonal Branch  1st Diag lesion 0% stenosed  Non-stenotic 1st Diag lesion was previously treated.  Second Septal Branch  Vessel is small in size.  Right Coronary Artery  Acute Marginal Branch  Acute Mrg lesion 60% stenosed  Acute Mrg lesion is 60% stenosed.   Coronary Diagrams  Diagnostic Dominance: Right  Intervention  Implants     RECOMMENDATION:   Sequential off-pump LIMA to LAD diagonal if possible.  High risk for kidney failure requiring dialysis either as a result of contrast used today, 90 cc or certainly after bypass surgery.     ECHO IMPRESSIONS 01/2019   1. Left ventricular ejection fraction, by visual estimation, is 55 to 60%. The left ventricle has normal function. Normal left ventricular size. There is no left ventricular hypertrophy.  2. Global right ventricle has normal systolic function.The right ventricular size is normal. No increase in right ventricular wall thickness.  3. Left atrial size was mildly dilated.  4. Right atrial  size was normal.  5. The mitral valve is normal in structure. Mild mitral valve regurgitation. No evidence of mitral stenosis.  6. The tricuspid valve is normal in structure. Tricuspid valve regurgitation is trivial.  7. The aortic valve is tricuspid Aortic valve regurgitation was not visualized by color flow Doppler. Mild aortic valve sclerosis without stenosis.  8. The pulmonic valve was normal in structure. Pulmonic valve regurgitation is not visualized by color flow Doppler.  9. Normal pulmonary artery systolic pressure. 10. The inferior vena cava is normal in size with greater than 50% respiratory variability, suggesting right atrial pressure of 3 mmHg.   Assessment/Plan:  1. CAD with recent STEMI with inability for PCI - now s/p CABG x 2 - off pump - doing ok but still with volume overload. BP up. Needs lab today. Giving a week of Lasix and potassium. Cardiac rehab has called him from [place]. We discussed follow up with Dr. Ubaldo Glassing who previously cared for him - they wish to continue with his care.   2. CKD - seeing Nephrology later this week.   3. DM - poorly controlled per A1C - per PCP  4. Prior amputation due to osteomyelitis.   5. HTN - BP trending up - adding back low dose Norvasc and low dose Hydralazine today. They will continue to monitor BP at home. Would hold on titrating the beta blocker - HR is 63 today.   6. HLD - on high dose intensity statin  7. Carotid disease - pre CABG doppler with 1 to 39% bilateral disease  8. Visual disturbance - sounds like he had some  degree of this prior to this most recent event - worse reportedly after the cath - ? Stroke - this is improving - he is seeing ophthalmology. Could consider CT/MRI of the head if fails to improve as well.   8. COVID-19 Education: The signs and symptoms of COVID-19 were discussed with the patient and how to seek care for testing (follow up with PCP or arrange E-visit).  The importance of social distancing,  staying at home, hand hygiene and wearing a mask when out in public were discussed today.  Current medicines are reviewed with the patient today.  The patient does not have concerns regarding medicines other than what has been noted above.  The following changes have been made:  See above.  Labs/ tests ordered today include:    Orders Placed This Encounter  Procedures  . Basic metabolic panel  . CBC  . EKG 12-Lead     Disposition:   FU with Dr. Ubaldo Glassing going forward. Will see if he can be seen there in about 2 to 3 weeks. We will see back as needed.  He has follow up as well with Dr. Kipp Brood.    Patient is agreeable to this plan and will call if any problems develop in the interim.   SignedTruitt Merle, NP  02/14/2019 10:29 AM  [place] [address] [place], [place]  [address] Phone: [phone] Fax: [phone]

## 2019-02-14 ENCOUNTER — Ambulatory Visit (INDEPENDENT_AMBULATORY_CARE_PROVIDER_SITE_OTHER): Payer: BC Managed Care – PPO | Admitting: Nurse Practitioner

## 2019-02-14 ENCOUNTER — Other Ambulatory Visit: Payer: Self-pay

## 2019-02-14 ENCOUNTER — Encounter: Payer: Self-pay | Admitting: Nurse Practitioner

## 2019-02-14 VITALS — BP 160/90 | HR 63 | Ht 70.0 in | Wt 231.8 lb

## 2019-02-14 DIAGNOSIS — E1165 Type 2 diabetes mellitus with hyperglycemia: Secondary | ICD-10-CM

## 2019-02-14 DIAGNOSIS — I259 Chronic ischemic heart disease, unspecified: Secondary | ICD-10-CM

## 2019-02-14 DIAGNOSIS — IMO0002 Reserved for concepts with insufficient information to code with codable children: Secondary | ICD-10-CM

## 2019-02-14 DIAGNOSIS — E1139 Type 2 diabetes mellitus with other diabetic ophthalmic complication: Secondary | ICD-10-CM

## 2019-02-14 DIAGNOSIS — Z951 Presence of aortocoronary bypass graft: Secondary | ICD-10-CM

## 2019-02-14 DIAGNOSIS — I213 ST elevation (STEMI) myocardial infarction of unspecified site: Secondary | ICD-10-CM

## 2019-02-14 DIAGNOSIS — Z89519 Acquired absence of unspecified leg below knee: Secondary | ICD-10-CM

## 2019-02-14 DIAGNOSIS — E782 Mixed hyperlipidemia: Secondary | ICD-10-CM

## 2019-02-14 DIAGNOSIS — I10 Essential (primary) hypertension: Secondary | ICD-10-CM

## 2019-02-14 LAB — BASIC METABOLIC PANEL
BUN/Creatinine Ratio: 10 (ref 9–20)
BUN: 26 mg/dL — ABNORMAL HIGH (ref 6–24)
CO2: 23 mmol/L (ref 20–29)
Calcium: 9.2 mg/dL (ref 8.7–10.2)
Chloride: 101 mmol/L (ref 96–106)
Creatinine, Ser: 2.54 mg/dL — ABNORMAL HIGH (ref 0.76–1.27)
GFR calc Af Amer: 32 mL/min/{1.73_m2} — ABNORMAL LOW (ref >59)
GFR calc non Af Amer: 27 mL/min/{1.73_m2} — ABNORMAL LOW (ref >59)
Glucose: 48 mg/dL — ABNORMAL LOW (ref 65–99)
Potassium: 5.1 mmol/L (ref 3.5–5.2)
Sodium: 140 mmol/L (ref 134–144)

## 2019-02-14 LAB — CBC
Hematocrit: 29.8 % — ABNORMAL LOW (ref 37.5–51.0)
Hemoglobin: 9.8 g/dL — ABNORMAL LOW (ref 13.0–17.7)
MCH: 28.2 pg (ref 26.6–33.0)
MCHC: 32.9 g/dL (ref 31.5–35.7)
MCV: 86 fL (ref 79–97)
Platelets: 272 10*3/uL (ref 150–450)
RBC: 3.48 x10E6/uL — ABNORMAL LOW (ref 4.14–5.80)
RDW: 13.5 % (ref 11.6–15.4)
WBC: 9.4 10*3/uL (ref 3.4–10.8)

## 2019-02-14 MED ORDER — FUROSEMIDE 40 MG PO TABS: 40.0000 mg | ORAL_TABLET | Freq: Every day | ORAL | 0 refills | Status: DC

## 2019-02-14 MED ORDER — AMLODIPINE BESYLATE 5 MG PO TABS: 2.5000 mg | ORAL_TABLET | Freq: Every day | ORAL | 11 refills | Status: DC

## 2019-02-14 MED ORDER — POTASSIUM CHLORIDE ER 10 MEQ PO TBCR: 10.0000 meq | EXTENDED_RELEASE_TABLET | Freq: Every day | ORAL | 0 refills | Status: DC

## 2019-02-14 MED ORDER — HYDRALAZINE HCL 25 MG PO TABS: 25.0000 mg | ORAL_TABLET | Freq: Three times a day (TID) | ORAL | 3 refills | Status: DC

## 2019-02-14 NOTE — Patient Instructions (Addendum)
After Visit Summary:  We will be checking the following labs today - BMET and CBC   Medication Instructions:    Continue with your current medicines. BUT  I am adding back Norvasc but at a lower dose - I have sent in a RX for 5 mg - take just half a tablet daily  I am adding back Hydralazine 25 mg to take 3 times a day  I am giving you a week of Lasix 40 mg with Potassium 39meq daily for 7 days    If you need a refill on your cardiac medications before your next appointment, please call your pharmacy.     Testing/Procedures To Be Arranged:  N/A  Follow-Up:   See Dr. Jordan Hawks back in 2 to 3 weeks - I will send him a message to get you an appointment.     At [facility], you and your health needs are our priority.  As part of our continuing mission to provide you with exceptional heart care, we have created designated Provider Care Teams.  These Care Teams include your primary Cardiologist (physician) and Advanced Practice Providers (APPs -  Physician Assistants and Nurse Practitioners) who all work together to provide you with the care you need, when you need it.  Special Instructions:  . Stay safe, stay home, wash your hands for at least 20 seconds and wear a mask when out in public.  . It was good to talk with you both today.  Marland Kitchen Keep up the walking as tolerated . Keep a check on your blood pressure - we will most likely need to titrate your medicines further.    Call the [place] office at [phone] if you have any questions, problems or concerns.

## 2019-02-17 DIAGNOSIS — I129 Hypertensive chronic kidney disease with stage 1 through stage 4 chronic kidney disease, or unspecified chronic kidney disease: Secondary | ICD-10-CM | POA: Diagnosis not present

## 2019-02-17 DIAGNOSIS — N184 Chronic kidney disease, stage 4 (severe): Secondary | ICD-10-CM | POA: Diagnosis not present

## 2019-02-17 DIAGNOSIS — N179 Acute kidney failure, unspecified: Secondary | ICD-10-CM | POA: Diagnosis not present

## 2019-02-17 DIAGNOSIS — D631 Anemia in chronic kidney disease: Secondary | ICD-10-CM | POA: Diagnosis not present

## 2019-02-18 ENCOUNTER — Other Ambulatory Visit: Payer: Self-pay

## 2019-02-22 ENCOUNTER — Other Ambulatory Visit: Payer: Self-pay | Admitting: Physician Assistant

## 2019-02-22 ENCOUNTER — Ambulatory Visit: Payer: BC Managed Care – PPO | Admitting: Family Medicine

## 2019-02-22 ENCOUNTER — Ambulatory Visit (INDEPENDENT_AMBULATORY_CARE_PROVIDER_SITE_OTHER): Payer: BC Managed Care – PPO | Admitting: Family Medicine

## 2019-02-22 ENCOUNTER — Encounter: Payer: Self-pay | Admitting: Family Medicine

## 2019-02-22 ENCOUNTER — Other Ambulatory Visit: Payer: Self-pay

## 2019-02-22 VITALS — BP 130/70 | HR 64 | Temp 97.1°F | Ht 70.0 in | Wt 219.2 lb

## 2019-02-22 DIAGNOSIS — I10 Essential (primary) hypertension: Secondary | ICD-10-CM | POA: Diagnosis not present

## 2019-02-22 DIAGNOSIS — Z20822 Contact with and (suspected) exposure to covid-19: Secondary | ICD-10-CM

## 2019-02-22 DIAGNOSIS — E1165 Type 2 diabetes mellitus with hyperglycemia: Secondary | ICD-10-CM

## 2019-02-22 DIAGNOSIS — J3489 Other specified disorders of nose and nasal sinuses: Secondary | ICD-10-CM | POA: Insufficient documentation

## 2019-02-22 DIAGNOSIS — Z951 Presence of aortocoronary bypass graft: Secondary | ICD-10-CM | POA: Diagnosis not present

## 2019-02-22 DIAGNOSIS — E1139 Type 2 diabetes mellitus with other diabetic ophthalmic complication: Secondary | ICD-10-CM

## 2019-02-22 DIAGNOSIS — K5900 Constipation, unspecified: Secondary | ICD-10-CM | POA: Diagnosis not present

## 2019-02-22 DIAGNOSIS — IMO0002 Reserved for concepts with insufficient information to code with codable children: Secondary | ICD-10-CM

## 2019-02-22 DIAGNOSIS — H539 Unspecified visual disturbance: Secondary | ICD-10-CM | POA: Insufficient documentation

## 2019-02-22 MED ORDER — FLUTICASONE PROPIONATE 50 MCG/ACT NA SUSP: 2.0000 | Freq: Every day | NASAL | 6 refills | Status: DC

## 2019-02-22 MED ORDER — LACTULOSE 10 GM/15ML PO SOLN: 10.0000 g | Freq: Every day | ORAL | 0 refills | Status: DC | PRN

## 2019-02-22 NOTE — Assessment & Plan Note (Signed)
Potentially could be allergic rhinitis though I cannot rule out COVID-19.  I discussed this with the patient and advised that he get tested.  He will go today for testing.  Advised on quarantine precautions for home.  Discussed that his wife could be tested 5 to 7 days after his symptoms started.  Given health department forms.  We will treat for an allergic rhinitis component with Flonase.

## 2019-02-22 NOTE — Progress Notes (Signed)
[name], MD Phone: [phone]  Chase Clark is a 53 y.o. male who presents today for f/u.  CAD: Patient is status post two-vessel CABG.  Notes he has done fairly well following discharge from the hospital.  He does have some soreness in his chest though no chest pressure or heaviness.  He followed up with cardiothoracic surgery and sees them again in 2 weeks.  He will start rehab after that.  He followed up with cardiology and they noted he was a little volume overloaded and placed him on Lasix for 5 days.  He followed up with his nephrologist and they gave him an additional course of Lasix and the swelling has improved quite a bit.  He continues on metoprolol and Lipitor.  Hypertension: Has been running around 130/70.  Taking Norvasc, hydralazine, and metoprolol.  No chest pressure.  No shortness of breath.  Edema has been improving.  Diabetes: Typically 125-150 fasting.  He is taking Lantus 40 units twice daily and NovoLog 5-15 units 3 times daily with meals.  He bases his NovoLog dose on his food intake as opposed to his CBGs.  No polyuria or polydipsia.  No hypoglycemia.  Vision changes: Patient notes his vision has progressively been improving.  He is continuing to see ophthalmology for these changes.  Constipation: Patient notes he has always had issues with constipation though it has been more of an issue post surgery.  Over the last 3 days he has not had a bowel movement.  He has been using Senokot and that was helpful though has not been helpful over the last several days.  He is passing gas.  No abdominal pain.  Rhinorrhea: Patient notes over the last couple of days having some rhinorrhea and mild congestion.  He notes his sinuses are burning.  He has had a little bit of drainage.  A little tickle in his throat that occasionally causes a cough.  He has sneezed a few times.  He does have a history of allergies.  He is not taking any allergy medications.  Social History    Tobacco Use  Smoking Status Former Smoker  . Types: Cigarettes  . Quit date: 03/14/2008  . Years since quitting: 10.9  Smokeless Tobacco Never Used     ROS see history of present illness  Objective  Physical Exam Vitals:   02/22/19 0914  BP: 130/70  Pulse: 64  Temp: (!) 97.1 F (36.2 C)  SpO2: 99%    BP Readings from Last 3 Encounters:  02/22/19 130/70  02/14/19 (!) 160/90  02/07/19 117/80   Wt Readings from Last 3 Encounters:  02/22/19 219 lb 3.2 oz (99.4 kg)  02/14/19 231 lb 12.8 oz (105.1 kg)  02/07/19 229 lb (103.9 kg)    Physical Exam Constitutional:      General: He is not in acute distress.    Appearance: He is not diaphoretic.  HENT:     Mouth/Throat:     Mouth: Mucous membranes are moist.     Pharynx: Oropharynx is clear.  Eyes:     Conjunctiva/sclera: Conjunctivae normal.     Pupils: Pupils are equal, round, and reactive to light.  Cardiovascular:     Rate and Rhythm: Normal rate and regular rhythm.     Heart sounds: Normal heart sounds.  Pulmonary:     Effort: Pulmonary effort is normal.     Breath sounds: Normal breath sounds.  Chest:     Comments: Well-healing sternotomy scar Abdominal:  General: Bowel sounds are normal. There is no distension.     Palpations: Abdomen is soft.     Tenderness: There is no abdominal tenderness. There is no guarding or rebound.  Musculoskeletal:     Right lower leg: No edema.  Skin:    General: Skin is warm and dry.  Neurological:     Mental Status: He is alert.      Assessment/Plan: Please see individual problem list.  Essential hypertension Improved control.  He will continue his current regimen.  He will continue to monitor his blood pressure.  S/P CABG x 2 Patient is status post CABG.  He is doing well following surgery.  He will continue to see his cardiologist and cardiothoracic surgeon.  Constipation Benign abdominal exam.  He will trial lactulose.  Rhinorrhea Potentially could be  allergic rhinitis though I cannot rule out COVID-19.  I discussed this with the patient and advised that he get tested.  He will go today for testing.  Advised on quarantine precautions for home.  Discussed that his wife could be tested 5 to 7 days after his symptoms started.  Given health department forms.  We will treat for an allergic rhinitis component with Flonase.  Vision changes He will continue to follow with ophthalmology.  DM (diabetes mellitus), type 2, uncontrolled w/ophthalmic complication (HCC) Much improved CBGs.  He will continue his current regimen.  He will be due for an A1c in 2 to 3 months.  No orders of the defined types were placed in this encounter.   Message sent to clinical pharmacist to check on Dexcom CGM for this patient.   Meds ordered this encounter  Medications  . lactulose (CHRONULAC) 10 GM/15ML solution    Sig: Take 15 mLs (10 g total) by mouth daily as needed for mild constipation.    Dispense:  236 mL    Refill:  0  . fluticasone (FLONASE) 50 MCG/ACT nasal spray    Sig: Place 2 sprays into both nostrils daily.    Dispense:  16 g    Refill:  [place], MD [place]

## 2019-02-22 NOTE — Assessment & Plan Note (Signed)
Benign abdominal exam.  He will trial lactulose.

## 2019-02-22 NOTE — Patient Instructions (Addendum)
Nice to see you.  Please go get tested for Covid.  Please remain quarantined at home until we contact you with the results. We will treat you with Flonase for possible allergy symptoms. Please try the lactulose for your constipation. If your constipation is not improving please let us know. We will contact you regarding the Dexcom.

## 2019-02-22 NOTE — Assessment & Plan Note (Signed)
He will continue to follow with ophthalmology 

## 2019-02-22 NOTE — Assessment & Plan Note (Signed)
Improved control.  He will continue his current regimen.  He will continue to monitor his blood pressure.

## 2019-02-22 NOTE — Assessment & Plan Note (Signed)
Patient is status post CABG.  He is doing well following surgery.  He will continue to see his cardiologist and cardiothoracic surgeon.

## 2019-02-22 NOTE — Assessment & Plan Note (Signed)
Much improved CBGs.  He will continue his current regimen.  He will be due for an A1c in 2 to 3 months.

## 2019-02-24 ENCOUNTER — Ambulatory Visit: Payer: Self-pay | Admitting: Pharmacist

## 2019-02-24 DIAGNOSIS — E1139 Type 2 diabetes mellitus with other diabetic ophthalmic complication: Secondary | ICD-10-CM

## 2019-02-24 DIAGNOSIS — Z951 Presence of aortocoronary bypass graft: Secondary | ICD-10-CM

## 2019-02-24 DIAGNOSIS — E1169 Type 2 diabetes mellitus with other specified complication: Secondary | ICD-10-CM

## 2019-02-24 DIAGNOSIS — IMO0002 Reserved for concepts with insufficient information to code with codable children: Secondary | ICD-10-CM

## 2019-02-24 DIAGNOSIS — E785 Hyperlipidemia, unspecified: Secondary | ICD-10-CM

## 2019-02-24 MED ORDER — DEXCOM G6 SENSOR MISC: 1.0000 | Freq: Four times a day (QID) | 3 refills | Status: DC

## 2019-02-24 MED ORDER — DEXCOM G6 RECEIVER DEVI: 1.0000 | Freq: Four times a day (QID) | 1 refills | Status: DC

## 2019-02-24 MED ORDER — ATORVASTATIN CALCIUM 80 MG PO TABS: 80.0000 mg | ORAL_TABLET | Freq: Every day | ORAL | 1 refills | Status: DC

## 2019-02-24 MED ORDER — DEXCOM G6 TRANSMITTER MISC: 1.0000 | Freq: Four times a day (QID) | 1 refills | Status: DC

## 2019-02-24 MED ORDER — ASPIRIN 325 MG PO TBEC: 325.0000 mg | DELAYED_RELEASE_TABLET | Freq: Every day | ORAL | 0 refills | Status: DC

## 2019-02-24 NOTE — Patient Instructions (Signed)
Visit Information  Goals Addressed            This Visit's Progress     Patient Stated   . "I want to get my diabetes under control" (pt-stated)       Current Barriers:  . 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 Wife, [name], is a Marine scientist in cardiology and manages his medications. Requests refill on ASA and atorvastatin today  o ASCVD risk reduction: metoprolol 50 mg BID, hydralazine 25 mg TID, amlodipine 2.5 mg daily; wife reports home BPs 130s/70s; atorvastatin 80 mg daily; last LDL 72, ASA 325 mg daily  o T2DM: uncontrolled but improved per BG, last A1c 9.3%; Lantus 40 units TID, Humalog sliding scale 5-15 units TID with meals; wife reports CBG 110-120s generally; no hx GLP1 therapy per wife; they are very interested in CGM therapy as they are checking BG QID right now o Constipation: senna PRN; lactulose PRN - wife reports he has only needed lactulose twice since being prescribed  Pharmacist Clinical Goal(s):  Marland Kitchen Over the next 90 days, patient will work with PharmD and provider towards optimized medication management  Interventions: . Comprehensive medication review performed; medication list updated in electronic medical record . Discussed that BCBS generally prefers Dexcom brand CGM. Patient should qualify given frequency of injections and frequency of BG checks. Will send Dexcom sensors, receiver, and transmitter Rx to local pharmacy, anticipate they will fax PA requirement or DWO back to the clinic for completion. Will collaborate w/ Dr. Ellen Henri team on this . Discussed with wife the potential to consider diabetes agents with the potential for ASCVD risk reduction. Cannot utilize SGLT2 given renal function, but may consider GLP1 . Per wife's request, refilled atorvastatin. Consider repeat lipid panel with next lab work, and add ezetimibe if LDL remains >70.  Marland Kitchen Per  wife's request, also refilled aspirin therapy - however, educated that duration of high dose aspirin s/p CABG varies. Encouraged to discuss w/ Dr. Kipp Brood at next follow up. Additionally, as patient is ~1 month out from CABG, messaged Dr. Kipp Brood for recommendations.  Patient Self Care Activities:  . Patient will take medications as prescribed  Initial goal documentation        The patient verbalized understanding of instructions provided today and declined a print copy of patient instruction materials.    Plan: - Scheduled follow up call with patient and wife for DM review in ~4 weeks  Catie Darnelle Maffucci, PharmD, [place] Pharmacist [facility] [phone]

## 2019-02-24 NOTE — Chronic Care Management (AMB) (Signed)
Chronic Care Management   Note  02/24/2019 Name: Chase Clark MRN: [id number] DOB: [birth year]   Subjective:  Chase Clark is a 53 y.o. year old male who is a primary care patient of [name], [name], MD. The CCM team was consulted for assistance with chronic disease management and care coordination needs.     [name] was given information about Chronic Care Management services today including:  1. CCM service includes personalized support from designated clinical staff supervised by his physician, including individualized plan of care and coordination with other care providers 2. 24/7 contact phone numbers for assistance for urgent and routine care needs. 3. Service will only be billed when office clinical staff spend 20 minutes or more in a month to coordinate care. 4. Only one practitioner may furnish and bill the service in a calendar month. 5. The patient may stop CCM services at any time (effective at the end of the month) by phone call to the office staff. 6. The patient will be responsible for cost sharing (co-pay) of up to 20% of the service fee (after annual deductible is met).  Patient agreed to services and verbal consent obtained.   Review of patient status, including review of consultants reports, laboratory and other test data, was performed as part of comprehensive evaluation and provision of chronic care management services.   Objective:  Lab Results  Component Value Date   CREATININE 2.54 (H) 02/14/2019   CREATININE 3.36 (H) 01/31/2019   CREATININE 3.98 (H) 01/30/2019    Lab Results  Component Value Date   HGBA1C 9.3 (H) 01/19/2019       Component Value Date/Time   CHOL 145 01/19/2019 0843   TRIG 151 (H) 01/19/2019 0843   HDL 43 01/19/2019 0843   CHOLHDL 3.4 01/19/2019 0843   VLDL 30 01/19/2019 0843   LDLCALC 72 01/19/2019 0843   LDLDIRECT 130.0 12/17/2018 0836    Clinical ASCVD: Yes  The ASCVD Risk score (Goff DC Jr., et al., 2013)  failed to calculate for the following reasons:   The patient has a prior MI or stroke diagnosis    BP Readings from Last 3 Encounters:  02/22/19 130/70  02/14/19 (!) 160/90  02/07/19 117/80    No Known Allergies  Medications Reviewed Today    Reviewed by De Hollingshead, [facility] (Pharmacist) on 02/24/19 at 1548  Med List Status: <None>  Medication Order Taking? Sig Documenting Provider Last Dose Status Informant  acetaminophen (TYLENOL) 325 MG tablet [id number] No Take 1 tablet (325 mg total) by mouth every 6 (six) hours as needed for mild pain (pain score 1-3 or temp > 100.5).  Patient not taking: Reported on 02/24/2019   Grier Mitts, PA-C Not Taking Active Spouse/Significant Other  amLODipine (NORVASC) 5 MG tablet [id number] Yes Take 0.5 tablets (2.5 mg total) by mouth daily. Burtis Junes, NP Taking Active   aspirin 325 MG EC tablet [id number] Yes Take 1 tablet (325 mg total) by mouth daily. Elgie Collard, [place] Taking Active   atorvastatin (LIPITOR) 80 MG tablet [id number] Yes Take 1 tablet (80 mg total) by mouth daily at 6 PM. Elgie Collard, PA-C Taking Active   brimonidine-timolol (COMBIGAN) 0.2-0.5 % ophthalmic solution [id number]  Place 1 drop into the right eye every 12 (twelve) hours.  [provider]  Active Spouse/Significant Other           Med Note Chanetta Marshall Feb 14, 2019  9:49 AM)  esomeprazole (NEXIUM) 20 MG capsule [id number]  Take 20 mg by mouth daily.  [provider]  Active Spouse/Significant Other  fluticasone (FLONASE) 50 MCG/ACT nasal spray [id number]  Place 2 sprays into both nostrils daily. [name], MD  Active         Discontinued 02/24/19 1546 (Completed Course)   gabapentin (NEURONTIN) 300 MG capsule [id number] Yes Take 1 capsule (300 mg total) by mouth 2 (two) times daily. Elgie Collard, PA-C Taking Active   hydrALAZINE (APRESOLINE) 25 MG tablet [id number] Yes Take 1 tablet (25 mg total) by mouth 3 (three)  times daily. Burtis Junes, NP Taking Active   insulin aspart (NOVOLOG) 100 UNIT/ML injection [id number] Yes Inject up to 15 units up to 3 times daily with meals [name], MD Taking Active Spouse/Significant Other  Insulin Glargine (LANTUS) 100 UNIT/ML Solostar Pen [id number] Yes Inject 40 Units into the skin daily.  Patient taking differently: Inject 40 Units into the skin 2 (two) times daily.    Elgie Collard, PA-C Taking Active   Insulin Pen Needle 32G X 4 MM MISC [id number] Yes Inject 5 pens into the skin daily. Use to inject Lantus and Novolog up to 5 times daily [name], MD Taking Active Spouse/Significant Other  lactulose (CHRONULAC) 10 GM/15ML solution [id number] Yes Take 15 mLs (10 g total) by mouth daily as needed for mild constipation. [name], MD Taking Active   latanoprost (XALATAN) 0.005 % ophthalmic solution [id number]  Place 1 drop into both eyes at bedtime. [provider]  Active Spouse/Significant Other           Med Note Chanetta Marshall Feb 14, 2019  9:49 AM)    metoprolol tartrate (LOPRESSOR) 50 MG tablet [id number] Yes Take 50 mg by mouth 2 (two) times daily. [provider] Taking Active   oxyCODONE (OXY IR/ROXICODONE) 5 MG immediate release tablet [id number] No Take 1 tablet (5 mg total) by mouth every 6 (six) hours as needed for severe pain.  Patient not taking: Reported on 02/24/2019   Miguel Aschoff Not Taking Active         Discontinued 02/24/19 1547 (Completed Course)   senna (SENOKOT) 8.6 MG tablet [id number] Yes Take 1 tablet by mouth daily. [provider] Taking Active   tamsulosin (FLOMAX) 0.4 MG CAPS capsule [id number] Yes Take 1 capsule (0.4 mg total) by mouth daily. Elgie Collard, PA-C Taking Active            Assessment:   Goals Addressed            This Visit's Progress     Patient Stated   . "I want to get my diabetes under control" (pt-stated)       Current Barriers:  .  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 Wife, [name], is a Marine scientist in cardiology and manages his medications. Requests refill on ASA and atorvastatin today  o ASCVD risk reduction: metoprolol 50 mg BID, hydralazine 25 mg TID, amlodipine 2.5 mg daily; wife reports home BPs 130s/70s; atorvastatin 80 mg daily; last LDL 72, ASA 325 mg daily  o T2DM: uncontrolled but improved per BG, last A1c 9.3%; Lantus 40 units TID, Humalog sliding scale 5-15 units TID with meals; wife reports CBG 110-120s generally; no hx GLP1 therapy per wife; they are very interested in CGM therapy as they are  checking BG QID right now o Constipation: senna PRN; lactulose PRN - wife reports he has only needed lactulose twice since being prescribed  Pharmacist Clinical Goal(s):  Marland Kitchen Over the next 90 days, patient will work with PharmD and provider towards optimized medication management  Interventions: . Comprehensive medication review performed; medication list updated in electronic medical record . Discussed that BCBS generally prefers Dexcom brand CGM. Patient should qualify given frequency of injections and frequency of BG checks. Will send Dexcom sensors, receiver, and transmitter Rx to local pharmacy, anticipate they will fax PA requirement or DWO back to the clinic for completion. Will collaborate w/ Dr. Ellen Henri team on this . Discussed with wife the potential to consider diabetes agents with the potential for ASCVD risk reduction. Cannot utilize SGLT2 given renal function, but may consider GLP1 . Per wife's request, refilled atorvastatin. Consider repeat lipid panel with next lab work, and add ezetimibe if LDL remains >70.  Marland Kitchen Per wife's request, also refilled aspirin therapy - however, educated that duration of high dose aspirin s/p CABG varies. Encouraged to discuss w/ Dr. Kipp Brood at next follow up. Additionally,  as patient is ~1 month out from CABG, messaged Dr. Kipp Brood for recommendations.  Patient Self Care Activities:  . Patient will take medications as prescribed  Initial goal documentation        Plan: - Scheduled follow up call with patient and wife for DM review in ~4 weeks  Catie Darnelle Maffucci, PharmD, [place] Pharmacist [place] [phone]

## 2019-02-24 NOTE — Progress Notes (Signed)
Reviewed.  Agree with assessment and plan.  D/w Dr [name] regarding f/u labs and plan for adjustment in medication if LDL not at goal.    Dr Nicki Reaper

## 2019-02-25 ENCOUNTER — Telehealth: Payer: Self-pay | Admitting: Family Medicine

## 2019-02-25 DIAGNOSIS — E785 Hyperlipidemia, unspecified: Secondary | ICD-10-CM

## 2019-02-25 LAB — NOVEL CORONAVIRUS, NAA: SARS-CoV-2, NAA: NOT DETECTED

## 2019-02-25 NOTE — Telephone Encounter (Signed)
Called and left a voicemail to call back and schedule labs.  Nina,cma

## 2019-02-25 NOTE — Telephone Encounter (Signed)
Please call the patient and get him set up for recheck of his cholesterol in about 8 weeks. Orders have been placed. Thanks.

## 2019-02-28 ENCOUNTER — Ambulatory Visit: Payer: BC Managed Care – PPO | Admitting: Pharmacist

## 2019-02-28 DIAGNOSIS — E1139 Type 2 diabetes mellitus with other diabetic ophthalmic complication: Secondary | ICD-10-CM

## 2019-02-28 DIAGNOSIS — IMO0002 Reserved for concepts with insufficient information to code with codable children: Secondary | ICD-10-CM

## 2019-02-28 NOTE — Patient Instructions (Signed)
Visit Information  Goals Addressed            This Visit's Progress     Patient Stated   . "I want to get my diabetes under control" (pt-stated)       Current Barriers:  . 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 Wife, [name], is a Marine scientist in cardiology and manages his medications. Requests refill on ASA and atorvastatin today  o ASCVD risk reduction: metoprolol 50 mg BID, hydralazine 25 mg TID, amlodipine 2.5 mg daily; wife reports home BPs 130s/70s; atorvastatin 80 mg daily; last LDL 72, ASA 325 mg daily  o T2DM: uncontrolled but improved per BG, last A1c 9.3%; Lantus 40 units TID, Humalog sliding scale 5-15 units TID with meals; wife reports CBG 110-120s generally; no hx GLP1 therapy per wife; they are very interested in CGM therapy as they are checking BG QID right now - Submitted Dexcom prescriptions to pharmacy last week o Constipation: senna PRN; lactulose PRN - wife reports he has only needed lactulose twice since being prescribed  Pharmacist Clinical Goal(s):  Marland Kitchen Over the next 90 days, patient will work with PharmD and provider towards optimized medication management  Interventions: . Contacted [facility] to follow up. Spoke with the pharmacist, [place]. She noted that the Dexcom went through for $0 copay, and was picked up over the weekend.  . Per Dr. [name], patient needs to be scheduled for f/u lipid panel in ~8 weeks. Message had been left w/ patient to schedule this. If LDL not at goal, recommend addition of ezetimibe.   Patient Self Care Activities:  . Patient will take medications as prescribed  Please see past updates related to this goal by clicking on the "Past Updates" button in the selected goal         The patient verbalized understanding of instructions provided today and declined a print copy of patient instruction materials.   Plan:  - Will  outreach patient as scheduled in ~ 2 weeks  Catie Darnelle Maffucci, PharmD, [place] Pharmacist [facility] [employer] [phone]

## 2019-02-28 NOTE — Chronic Care Management (AMB) (Signed)
Chronic Care Management   Follow Up Note   02/28/2019 Name: Chase Clark MRN: [id number] DOB: [birth year]  Referred by: [name], MD Reason for referral : Chronic Care Management (Medication Management)   Chase Clark is a 53 y.o. year old male who is a primary care patient of Chase Clark, Chase Adam, MD. The CCM team was consulted for assistance with chronic disease management and care coordination needs.    Care coordination completed today.   Review of patient status, including review of consultants reports, relevant laboratory and other test results, and collaboration with appropriate care team members and the patient's provider was performed as part of comprehensive patient evaluation and provision of chronic care management services.    SDOH (Social Determinants of Health) screening performed today: Financial Strain . See Care Plan for related entries.   Outpatient Encounter Medications as of 02/28/2019  Medication Sig  . acetaminophen (TYLENOL) 325 MG tablet Take 1 tablet (325 mg total) by mouth every 6 (six) hours as needed for mild pain (pain score 1-3 or temp > 100.5). (Patient not taking: Reported on 02/24/2019)  . amLODipine (NORVASC) 5 MG tablet Take 0.5 tablets (2.5 mg total) by mouth daily.  Marland Kitchen aspirin 325 MG EC tablet Take 1 tablet (325 mg total) by mouth daily.  Marland Kitchen atorvastatin (LIPITOR) 80 MG tablet Take 1 tablet (80 mg total) by mouth daily.  . brimonidine-timolol (COMBIGAN) 0.2-0.5 % ophthalmic solution Place 1 drop into the right eye every 12 (twelve) hours.   . Continuous Blood Gluc Receiver (DEXCOM G6 RECEIVER) DEVI 1 Device by Does not apply route 4 (four) times daily. Use to check blood sugars up to 4 times daily  . Continuous Blood Gluc Sensor (DEXCOM G6 SENSOR) MISC 1 Device by Does not apply route 4 (four) times daily. Place a new sensor every 10 days to check blood sugar up to 4 times daily  . Continuous Blood Gluc Transmit (DEXCOM G6 TRANSMITTER)  MISC 1 applicator by Does not apply route 4 (four) times daily.  Marland Kitchen esomeprazole (NEXIUM) 20 MG capsule Take 20 mg by mouth daily.   . fluticasone (FLONASE) 50 MCG/ACT nasal spray Place 2 sprays into both nostrils daily.  Marland Kitchen gabapentin (NEURONTIN) 300 MG capsule Take 1 capsule (300 mg total) by mouth 2 (two) times daily.  . hydrALAZINE (APRESOLINE) 25 MG tablet Take 1 tablet (25 mg total) by mouth 3 (three) times daily.  . insulin aspart (NOVOLOG) 100 UNIT/ML injection Inject up to 15 units up to 3 times daily with meals  . Insulin Glargine (LANTUS) 100 UNIT/ML Solostar Clark Inject 40 Units into the skin daily. (Patient taking differently: Inject 40 Units into the skin 2 (two) times daily. )  . Insulin Clark Needle 32G X 4 MM MISC Inject 5 pens into the skin daily. Use to inject Lantus and Novolog up to 5 times daily  . lactulose (CHRONULAC) 10 GM/15ML solution Take 15 mLs (10 g total) by mouth daily as needed for mild constipation.  Marland Kitchen latanoprost (XALATAN) 0.005 % ophthalmic solution Place 1 drop into both eyes at bedtime.  . metoprolol tartrate (LOPRESSOR) 50 MG tablet Take 50 mg by mouth 2 (two) times daily.  Marland Kitchen oxyCODONE (OXY IR/ROXICODONE) 5 MG immediate release tablet Take 1 tablet (5 mg total) by mouth every 6 (six) hours as needed for severe pain. (Patient not taking: Reported on 02/24/2019)  . senna (SENOKOT) 8.6 MG tablet Take 1 tablet by mouth daily.  . tamsulosin (FLOMAX) 0.4 MG  CAPS capsule Take 1 capsule (0.4 mg total) by mouth daily.   No facility-administered encounter medications on file as of 02/28/2019.      Goals Addressed            This Visit's Progress     Patient Stated   . "I want to get my diabetes under control" (pt-stated)       Current Barriers:  . 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 Wife, Chase Clark, is a Marine scientist in cardiology and manages his medications.  Requests refill on ASA and atorvastatin today  o ASCVD risk reduction: metoprolol 50 mg BID, hydralazine 25 mg TID, amlodipine 2.5 mg daily; wife reports home BPs 130s/70s; atorvastatin 80 mg daily; last LDL 72, ASA 325 mg daily  o T2DM: uncontrolled but improved per BG, last A1c 9.3%; Lantus 40 units TID, Humalog sliding scale 5-15 units TID with meals; wife reports CBG 110-120s generally; no hx GLP1 therapy per wife; they are very interested in CGM therapy as they are checking BG QID right now - Submitted Dexcom prescriptions to pharmacy last week o Constipation: senna PRN; lactulose PRN - wife reports he has only needed lactulose twice since being prescribed  Pharmacist Clinical Goal(s):  Marland Kitchen Over the next 90 days, patient will work with PharmD and provider towards optimized medication management  Interventions: . [facility]ed [facility] to follow [facility]. Spoke with the pharmacist, [place]. She noted that the Dexcom went through for $0 copay, and was picked up over the weekend.  . Per Dr. Caryl Clark, patient needs to be scheduled for f/u lipid panel in ~8 weeks. Message had been left w/ patient to schedule this. If LDL not at goal, recommend addition of ezetimibe.   Patient Self Care Activities:  . Patient will take medications as prescribed  Please see past updates related to this goal by clicking on the "Past Updates" button in the selected goal          Plan:  - Will outreach patient as scheduled in ~ 2 weeks  Catie Darnelle Maffucci, PharmD, [place] Pharmacist [facility] [place] [phone]

## 2019-02-28 NOTE — Progress Notes (Signed)
Reviewed.  Agree with plan   Dr Scott 

## 2019-03-01 ENCOUNTER — Other Ambulatory Visit: Payer: Self-pay | Admitting: Family Medicine

## 2019-03-02 ENCOUNTER — Other Ambulatory Visit (HOSPITAL_COMMUNITY): Payer: Self-pay | Admitting: *Deleted

## 2019-03-03 ENCOUNTER — Other Ambulatory Visit: Payer: Self-pay

## 2019-03-03 ENCOUNTER — Encounter (HOSPITAL_COMMUNITY)
Admission: RE | Admit: 2019-03-03 | Discharge: 2019-03-03 | Disposition: A | Discharge status: Home / Self Care | Payer: BC Managed Care – PPO | Source: Ambulatory Visit | Attending: Nephrology | Admitting: Nephrology

## 2019-03-03 ENCOUNTER — Other Ambulatory Visit: Payer: Self-pay | Admitting: Thoracic Surgery (Cardiothoracic Vascular Surgery)

## 2019-03-03 DIAGNOSIS — D631 Anemia in chronic kidney disease: Secondary | ICD-10-CM | POA: Diagnosis not present

## 2019-03-03 DIAGNOSIS — N189 Chronic kidney disease, unspecified: Secondary | ICD-10-CM | POA: Insufficient documentation

## 2019-03-03 DIAGNOSIS — Z951 Presence of aortocoronary bypass graft: Secondary | ICD-10-CM

## 2019-03-03 MED ORDER — SODIUM CHLORIDE 0.9 % IV SOLN
510.0000 mg | INTRAVENOUS | Status: DC
  Administered 2019-03-03: 510 mg via INTRAVENOUS
  Filled 2019-03-03: qty 510

## 2019-03-03 NOTE — Discharge Instructions (Signed)

## 2019-03-04 ENCOUNTER — Ambulatory Visit
Admission: RE | Admit: 2019-03-04 | Discharge: 2019-03-04 | Disposition: A | Discharge status: Home / Self Care | Payer: BC Managed Care – PPO | Source: Ambulatory Visit | Attending: Thoracic Surgery (Cardiothoracic Vascular Surgery) | Admitting: Thoracic Surgery (Cardiothoracic Vascular Surgery)

## 2019-03-04 ENCOUNTER — Encounter: Payer: Self-pay | Admitting: Thoracic Surgery (Cardiothoracic Vascular Surgery)

## 2019-03-04 ENCOUNTER — Other Ambulatory Visit: Payer: Self-pay

## 2019-03-04 ENCOUNTER — Ambulatory Visit (INDEPENDENT_AMBULATORY_CARE_PROVIDER_SITE_OTHER): Payer: Self-pay | Admitting: Thoracic Surgery (Cardiothoracic Vascular Surgery)

## 2019-03-04 VITALS — BP 110/72 | HR 61 | Temp 97.8°F | Resp 20 | Ht 70.0 in | Wt 223.0 lb

## 2019-03-04 DIAGNOSIS — Z951 Presence of aortocoronary bypass graft: Secondary | ICD-10-CM

## 2019-03-04 DIAGNOSIS — I2510 Atherosclerotic heart disease of native coronary artery without angina pectoris: Secondary | ICD-10-CM

## 2019-03-04 MED ORDER — LACTULOSE 20 GM/30ML PO SOLN: 30.0000 mL | ORAL | 1 refills | Status: DC | PRN

## 2019-03-04 MED ORDER — ASPIRIN 81 MG PO CHEW: 81.0000 mg | CHEWABLE_TABLET | Freq: Every day | ORAL | 1 refills | Status: DC

## 2019-03-04 NOTE — Progress Notes (Signed)
      [address]       [place] [address]             [phone]        Josearmando A Kravitz Green Medical Record #[id number] Date of Birth: 09-16-1965  Referring: Teodoro Spray, MD Primary Care: [name], MD Primary Cardiologist:No primary care provider on file.  Reason for visit:   follow-up  History of Present Illness:     53 year old male presents for second follow-up after undergoing CABG.  He has done well since he has been.  He continues to have some visual disturbances but states that this is getting better.  He has been following up with his ophthalmologist.  Physical Exam: BP 110/72   Pulse 61   Temp 97.8 F (36.6 C) (Skin)   Resp 20   Ht 5\' 10"  (1.778 m)   Wt 223 lb (101.2 kg)   SpO2 97% Comment: RA  BMI 32.00 kg/m   Alert NAD Regular, no murmur.  Incision clean.  Sternum stable Abdomen soft, NT/ND No peripheral edema   Diagnostic Studies & Laboratory data: CXR: Clear     Assessment / Plan:   53 year old male status post CABG.  Doing well .  Cleared for cardiac rehab. Follow-up as needed   Lajuana Matte 03/04/2019 4:01 PM

## 2019-03-07 DIAGNOSIS — I200 Unstable angina: Secondary | ICD-10-CM | POA: Diagnosis not present

## 2019-03-07 DIAGNOSIS — I2510 Atherosclerotic heart disease of native coronary artery without angina pectoris: Secondary | ICD-10-CM | POA: Diagnosis not present

## 2019-03-07 DIAGNOSIS — I739 Peripheral vascular disease, unspecified: Secondary | ICD-10-CM | POA: Diagnosis not present

## 2019-03-07 DIAGNOSIS — I10 Essential (primary) hypertension: Secondary | ICD-10-CM | POA: Diagnosis not present

## 2019-03-07 DIAGNOSIS — N184 Chronic kidney disease, stage 4 (severe): Secondary | ICD-10-CM | POA: Diagnosis not present

## 2019-03-07 DIAGNOSIS — E789 Disorder of lipoprotein metabolism, unspecified: Secondary | ICD-10-CM | POA: Diagnosis not present

## 2019-03-07 DIAGNOSIS — L8995 Pressure ulcer of unspecified site, unstageable: Secondary | ICD-10-CM | POA: Diagnosis not present

## 2019-03-08 ENCOUNTER — Telehealth: Payer: Self-pay | Admitting: Internal Medicine

## 2019-03-08 DIAGNOSIS — R509 Fever, unspecified: Secondary | ICD-10-CM

## 2019-03-08 MED ORDER — DOXYCYCLINE HYCLATE 100 MG PO TABS: 100.0000 mg | ORAL_TABLET | Freq: Two times a day (BID) | ORAL | 0 refills | Status: DC

## 2019-03-08 NOTE — Telephone Encounter (Signed)
I spoke with the patient's wife who is a Marine scientist and the patient's sisters who works at the local ER. The patient developed today for the first time fever of 100.1. He just got some Tylenol. 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 He had a CABG 5 weeks ago and the wound looks good. He has renal failure and is insulin-dependent diabetic My initial advise was to go to the emergency room. They are extremely concerned about he being exposed to COVID-19 since the ER has a number of cases at this point. They request an antibiotic.  I advised them sending antibiotic is definitely not my first choice but these are unusual circumstances and I understand their concerns. I sent a prescription for doxycycline (leg cellulitis?),recommend  to reach out to their primary doctor first in the morning and go to the ER later on tonight if pt get sicker or have other concerns. He had no cough, diarrhea, myalgias. Will route this conversation to PCP

## 2019-03-09 ENCOUNTER — Ambulatory Visit: Payer: BC Managed Care – PPO | Admitting: Family Medicine

## 2019-03-09 ENCOUNTER — Encounter: Payer: Self-pay | Admitting: Family Medicine

## 2019-03-09 ENCOUNTER — Telehealth: Payer: Self-pay

## 2019-03-09 ENCOUNTER — Emergency Department (HOSPITAL_COMMUNITY)
Admission: EM | Admit: 2019-03-09 | Discharge: 2019-03-09 | Disposition: A | Discharge status: Home / Self Care | Payer: BC Managed Care – PPO | Attending: Emergency Medicine | Admitting: Emergency Medicine

## 2019-03-09 DIAGNOSIS — Z79899 Other long term (current) drug therapy: Secondary | ICD-10-CM | POA: Insufficient documentation

## 2019-03-09 DIAGNOSIS — I2510 Atherosclerotic heart disease of native coronary artery without angina pectoris: Secondary | ICD-10-CM | POA: Insufficient documentation

## 2019-03-09 DIAGNOSIS — Z794 Long term (current) use of insulin: Secondary | ICD-10-CM | POA: Diagnosis not present

## 2019-03-09 DIAGNOSIS — Z87891 Personal history of nicotine dependence: Secondary | ICD-10-CM | POA: Diagnosis not present

## 2019-03-09 DIAGNOSIS — Y929 Unspecified place or not applicable: Secondary | ICD-10-CM | POA: Insufficient documentation

## 2019-03-09 DIAGNOSIS — Z20828 Contact with and (suspected) exposure to other viral communicable diseases: Secondary | ICD-10-CM | POA: Insufficient documentation

## 2019-03-09 DIAGNOSIS — Y999 Unspecified external cause status: Secondary | ICD-10-CM | POA: Insufficient documentation

## 2019-03-09 DIAGNOSIS — X58XXXA Exposure to other specified factors, initial encounter: Secondary | ICD-10-CM | POA: Insufficient documentation

## 2019-03-09 DIAGNOSIS — Y939 Activity, unspecified: Secondary | ICD-10-CM | POA: Diagnosis not present

## 2019-03-09 DIAGNOSIS — E119 Type 2 diabetes mellitus without complications: Secondary | ICD-10-CM | POA: Insufficient documentation

## 2019-03-09 DIAGNOSIS — S81802A Unspecified open wound, left lower leg, initial encounter: Secondary | ICD-10-CM | POA: Diagnosis not present

## 2019-03-09 DIAGNOSIS — Z03818 Encounter for observation for suspected exposure to other biological agents ruled out: Secondary | ICD-10-CM | POA: Diagnosis not present

## 2019-03-09 DIAGNOSIS — I10 Essential (primary) hypertension: Secondary | ICD-10-CM | POA: Diagnosis not present

## 2019-03-09 DIAGNOSIS — Z89512 Acquired absence of left leg below knee: Secondary | ICD-10-CM | POA: Diagnosis not present

## 2019-03-09 LAB — CBC WITH DIFFERENTIAL/PLATELET
Abs Immature Granulocytes: 0.02 10*3/uL (ref 0.00–0.07)
Basophils Absolute: 0.1 10*3/uL (ref 0.0–0.1)
Basophils Relative: 1 %
Eosinophils Absolute: 0.4 10*3/uL (ref 0.0–0.5)
Eosinophils Relative: 5 %
HCT: 39.4 % (ref 39.0–52.0)
Hemoglobin: 12.5 g/dL — ABNORMAL LOW (ref 13.0–17.0)
Immature Granulocytes: 0 %
Lymphocytes Relative: 13 %
Lymphs Abs: 1 10*3/uL (ref 0.7–4.0)
MCH: 27.4 pg (ref 26.0–34.0)
MCHC: 31.7 g/dL (ref 30.0–36.0)
MCV: 86.2 fL (ref 80.0–100.0)
Monocytes Absolute: 0.6 10*3/uL (ref 0.1–1.0)
Monocytes Relative: 9 %
Neutro Abs: 5.4 10*3/uL (ref 1.7–7.7)
Neutrophils Relative %: 72 %
Platelets: 160 10*3/uL (ref 150–400)
RBC: 4.57 MIL/uL (ref 4.22–5.81)
RDW: 13.5 % (ref 11.5–15.5)
WBC: 7.4 10*3/uL (ref 4.0–10.5)
nRBC: 0 % (ref 0.0–0.2)

## 2019-03-09 LAB — CBG MONITORING, ED
Glucose-Capillary: 148 mg/dL — ABNORMAL HIGH (ref 70–99)
Glucose-Capillary: 168 mg/dL — ABNORMAL HIGH (ref 70–99)

## 2019-03-09 LAB — BASIC METABOLIC PANEL
Anion gap: 11 (ref 5–15)
BUN: 27 mg/dL — ABNORMAL HIGH (ref 6–20)
CO2: 22 mmol/L (ref 22–32)
Calcium: 8.6 mg/dL — ABNORMAL LOW (ref 8.9–10.3)
Chloride: 102 mmol/L (ref 98–111)
Creatinine, Ser: 2.83 mg/dL — ABNORMAL HIGH (ref 0.61–1.24)
GFR calc Af Amer: 28 mL/min — ABNORMAL LOW (ref >60)
GFR calc non Af Amer: 24 mL/min — ABNORMAL LOW (ref >60)
Glucose, Bld: 172 mg/dL — ABNORMAL HIGH (ref 70–99)
Potassium: 4 mmol/L (ref 3.5–5.1)
Sodium: 135 mmol/L (ref 135–145)

## 2019-03-09 NOTE — Telephone Encounter (Signed)
He had negative covid test 02/22/2019  Do you still want him to have covid test ?  Ulcer on BKA or AKA I want to refer back to [facility] wound clinic  -are you agreeable  -Butch Penny is pt agreeable to referral?

## 2019-03-09 NOTE — Telephone Encounter (Signed)
Spoke w/ pt's wife.  See other telephone note.

## 2019-03-09 NOTE — Telephone Encounter (Signed)
Yes,  His fever started yesterday not nov 10 so a test two weeks ago means nothing

## 2019-03-09 NOTE — Discharge Instructions (Signed)
Please read and follow all provided instructions.  Your diagnoses today include:  1. Wound of left lower extremity, initial encounter     Tests performed today include:  Vital signs. See below for your results today.   Blood counts and electrolytes -normal white blood cell count, improving red blood cell count, unchanged kidney function  Medications prescribed:   None  Take any prescribed medications only as directed.   Home care instructions:  Please continue the course of doxycycline as prescribed by your doctor to cover wound infection.  Follow any educational materials contained in this packet. Keep affected area above the level of your heart when possible. Wash area gently twice a day with warm soapy water. Do not apply alcohol or hydrogen peroxide. Cover the area if it draining or weeping.   Follow-up instructions: Please follow-up with your wound care/plastics physician for continued management of your lower extremity wound.  Return instructions:  Return to the Emergency Department if you have:  Fever  Worsening symptoms  Worsening pain  Worsening swelling  Redness of the skin that moves away from the affected area, especially if it streaks away from the affected area   Any other emergent concerns  Your vital signs today were: BP 117/70    Pulse (!) 57    Temp 97.8 F (36.6 C) (Oral)    Resp (!) 8    SpO2 99%  If your blood pressure (BP) was elevated above 135/85 this visit, please have this repeated by your doctor within one month. --------------

## 2019-03-09 NOTE — ED Provider Notes (Signed)
[facility] EMERGENCY DEPARTMENT Provider Note   CSN: [id number] Arrival date & time: 03/09/19  1043     History   Chief Complaint No chief complaint on file.   HPI Chase Clark is a 53 y.o. male.     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  The patient states his wound is been present for several weeks but he states that yesterday he felt like he had increased warmth around the area.  He states that he did notice that he had some slight fever last night as well.  He states that he is not had any fever today.  Patient states he want to have the area checked just to make sure there is no signs of infection.  Patient states that he is in no other symptoms associated with this.  Patient states there is no drainage from the wound.  The patient denies chest pain, shortness of breath, headache,blurred vision, neck pain, fever, cough, weakness, numbness, dizziness, anorexia, edema, abdominal pain, nausea, vomiting, diarrhea, rash, back pain, dysuria, hematemesis, bloody stool, near syncope, or syncope. Past Medical History:  Diagnosis Date  . Arthritis   . Asthma    as child  . Coronary artery disease    DES DIAG1 11/20/09 [facility])  . Diabetes mellitus without complication ([place])   . Diabetic osteomyelitis ([place])   . Diabetic ulcer of foot with muscle involvement without evidence of necrosis ([place])    DIABETIC ULCERATIONS ASSOCIATED WITH IRRITATION LATERAL ANKLE LEFT GREATER THAN RIGHT WITH MILD CELLULITIS   . Diverticulosis   . DKA (diabetic ketoacidoses) ([place]) 08/23/2017  . Edema, lower extremity   . Fatty liver   . GERD (gastroesophageal reflux disease)   . Gilbert's disease   . Glaucoma   . Heart disease 2011   Patient has stent for 80% blockage  . Hyperlipidemia   . Hypertension   . Neuropathy   . Seasonal allergies   . Shoulder pain   . Ulcer of foot ([place]) 08.06.2014   DIABETIC ULCERATIONS ASSOCIATED WITH  IRRITATION LATERAL ANKLE LEFT GREATER THAN RIGHT WITH MILD CELLULITIS    Patient Active Problem List   Diagnosis Date Noted  . Rhinorrhea 02/22/2019  . Constipation 02/22/2019  . Vision changes 02/22/2019  . 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 AKI (acute kidney injury) ([place])   . Chest pain 01/19/2019  . Nephrotic syndrome 01/19/2019  . Unstable angina ([place]) 01/19/2019  . Allergic rhinitis 07/19/2018  . Abnormality of gait 12/02/2017  . Labile blood pressure   . Labile blood glucose   . Amputation of left lower extremity below knee ([place]) 09/09/2017  . Benign prostatic hyperplasia   . Hyperlipidemia   . Stage 3 chronic kidney disease   . Neuropathic pain   . Osteomyelitis of left foot ([place])   . Coronary artery disease involving native coronary artery of native heart without angina pectoris   . Anemia of chronic disease   . Leukocytosis   . Pressure injury of skin 08/24/2017  . Cervical spondylosis with myelopathy and radiculopathy 05/07/2017  . Renal failure 04/30/2017  . Fatty liver 04/04/2017  . DDD (degenerative disc disease), cervical 01/13/2017  . PAD (peripheral artery disease) ([place]) 08/07/2016  . Ulcer of ankle ([place]) 08/07/2016  . Chronic diarrhea 05/17/2016  . Hyponatremia 05/17/2016  . Anemia 01/12/2014  . Disorder of ligament of ankle 07/31/2013  . Edema 06/28/2013  . Impotence due to erectile dysfunction  06/12/2013  . Obesity (BMI 30-39.9) 06/12/2013  . Pain in joint, shoulder region 06/12/2013  . Statin intolerance 06/10/2013  . DM (diabetes mellitus), type 2, uncontrolled w/ophthalmic complication ([place]) 99/83/3825  . Hyperlipidemia associated with type 2 diabetes mellitus ([place]) 07/17/2006  . GILBERT'S SYNDROME 07/17/2006  . Essential hypertension 07/17/2006  . Coronary atherosclerosis 07/17/2006  . OSTEOARTHRITIS 07/17/2006  . COUGH 07/17/2006    Past Surgical History:  Procedure Laterality Date  . ABDOMINAL AORTOGRAM N/A 05/20/2016   Procedure: Abdominal  Aortogram possible intervention;  Surgeon: Katha Cabal, MD;  Location: [facility];  Service: Cardiovascular;  Laterality: N/A;  . AMPUTATION Left 09/05/2017   Procedure: AMPUTATION BELOW KNEE;  Surgeon: Tania Ade, MD;  Location: [facility];  Service: Orthopedics;  Laterality: Left;  . ANTERIOR CERVICAL DECOMP/DISCECTOMY FUSION N/A 05/07/2017   Procedure: ANTERIOR CERVICAL DECOMPRESSION/DISCECTOMY [place]venia Maxin PROSTHESIS,PLATE/SCREWS CERVICAL FIVE - CERVICAL SIX;  Surgeon: Newman Pies, MD;  Location: [facility];  Service: Neurosurgery;  Laterality: N/A;  . CARDIAC CATHETERIZATION    . CATARACT EXTRACTION W/ INTRAOCULAR LENS IMPLANT    . CATARACT EXTRACTION W/PHACO Left 02/26/2017   Procedure: CATARACT EXTRACTION PHACO AND INTRAOCULAR LENS PLACEMENT (IOC);  Surgeon: Eulogio Bear, MD;  Location: [facility];  Service: Ophthalmology;  Laterality: Left;  Lot # [id number] H [place]: 00:25.3 AP%: 6.2 CDE: 1.59   . CHOLECYSTECTOMY N/A   . CORONARY ANGIOPLASTY WITH STENT PLACEMENT  2007   1st diagonal  . CORONARY ARTERY BYPASS GRAFT N/A 01/27/2019   Procedure: OFF PUMP CORONARY ARTERY BYPASS GRAFTING (CABG) X 2 WITH ENDOSCOPIC HARVESTING OF RIGHT GREATER SAPHENOUS VEIN. LIMA TO LAD;  Surgeon: Lajuana Matte, MD;  Location: [facility];  Service: Open Heart Surgery;  Laterality: N/A;  . CORONARY STENT INTERVENTION N/A 01/21/2019   Procedure: CORONARY STENT INTERVENTION;  Surgeon: Belva Crome, MD;  Location: [facility];  Service: Cardiovascular;  Laterality: N/A;  . EPIBLEPHERON REPAIR WITH TEAR DUCT PROBING    . EYE SURGERY Right sept 29n2015   cataract extraction  . INSERTION EXPRESS TUBE SHUNT Right 09/06/2015   Procedure: INSERTION AHMED TUBE SHUNT with tutoplast allograft;  Surgeon: Eulogio Bear, MD;  Location: [facility];  Service: Ophthalmology;  Laterality: Right;  . LEFT HEART CATH AND CORONARY ANGIOGRAPHY N/A 01/20/2019   Procedure: LEFT HEART CATH AND CORONARY  ANGIOGRAPHY;  Surgeon: Lorretta Harp, MD;  Location: [facility];  Service: Cardiovascular;  Laterality: N/A;  . TEE WITHOUT CARDIOVERSION N/A 01/27/2019   Procedure: TRANSESOPHAGEAL ECHOCARDIOGRAM (TEE);  Surgeon: Lajuana Matte, MD;  Location: [facility];  Service: Open Heart Surgery;  Laterality: N/A;  . VASECTOMY          Home Medications    Prior to Admission medications   Medication Sig Start Date End Date Taking? Authorizing Provider  acetaminophen (TYLENOL) 325 MG tablet Take 1 tablet (325 mg total) by mouth every 6 (six) hours as needed for mild pain (pain score 1-3 or temp > 100.5). 09/06/17   Grier Mitts, PA-C  amLODipine (NORVASC) 5 MG tablet Take 0.5 tablets (2.5 mg total) by mouth daily. 02/14/19 02/14/20  Burtis Junes, NP  aspirin (ASPIRIN CHILDRENS) 81 MG chewable tablet Chew 1 tablet (81 mg total) by mouth daily. 03/04/19   Lajuana Matte, MD  atorvastatin (LIPITOR) 80 MG tablet Take 1 tablet (80 mg total) by mouth daily. 02/24/19   [name], MD  brimonidine-timolol (COMBIGAN) 0.2-0.5 % ophthalmic solution Place 1 drop  into the right eye every 12 (twelve) hours.     [provider]  Continuous Blood Gluc Receiver (DEXCOM G6 RECEIVER) DEVI 1 Device by Does not apply route 4 (four) times daily. Use to check blood sugars up to 4 times daily 02/24/19   [name], MD  Continuous Blood Gluc Sensor (DEXCOM G6 SENSOR) MISC 1 Device by Does not apply route 4 (four) times daily. Place a new sensor every 10 days to check blood sugar up to 4 times daily 02/24/19   [name], MD  Continuous Blood Gluc Transmit (DEXCOM G6 TRANSMITTER) MISC 1 applicator by Does not apply route 4 (four) times daily. 02/24/19   [name], MD  doxycycline (VIBRA-TABS) 100 MG tablet Take 1 tablet (100 mg total) by mouth 2 (two) times daily. 03/08/19   Colon Branch, MD  esomeprazole (NEXIUM) 20 MG capsule Take 20 mg by mouth daily.      [provider]  fluticasone (FLONASE) 50 MCG/ACT nasal spray Place 2 sprays into both nostrils daily. 02/22/19   [name], MD  gabapentin (NEURONTIN) 300 MG capsule Take 1 capsule (300 mg total) by mouth 2 (two) times daily. 01/31/19   Elgie Collard, PA-C  hydrALAZINE (APRESOLINE) 25 MG tablet Take 1 tablet (25 mg total) by mouth 3 (three) times daily. 02/14/19 05/15/19  Burtis Junes, NP  insulin aspart (NOVOLOG) 100 UNIT/ML injection Inject up to 15 units up to 3 times daily with meals 01/10/19   [name], MD  Insulin Glargine (LANTUS) 100 UNIT/ML Solostar Pen Inject 40 Units into the skin daily. Patient taking differently: Inject 40 Units into the skin 2 (two) times daily.  01/31/19   Elgie Collard, PA-C  Insulin Pen Needle 32G X 4 MM MISC Inject 5 pens into the skin daily. Use to inject Lantus and Novolog up to 5 times daily 01/10/19   [name], MD  lactulose (CHRONULAC) 10 GM/15ML solution Take 15 mLs (10 g total) by mouth daily as needed for mild constipation. 02/22/19   [name], MD  Lactulose 20 GM/30ML SOLN Take 30 mLs (20 g total) by mouth as needed. 03/04/19   Lightfoot, Lucile Crater, MD  latanoprost (XALATAN) 0.005 % ophthalmic solution Place 1 drop into both eyes at bedtime. 03/03/17   [provider]  metoprolol tartrate (LOPRESSOR) 50 MG tablet Take 50 mg by mouth 2 (two) times daily.    [provider]  oxyCODONE (OXY IR/ROXICODONE) 5 MG immediate release tablet Take 1 tablet (5 mg total) by mouth every 6 (six) hours as needed for severe pain. Patient not taking: Reported on 03/04/2019 01/31/19   Elgie Collard, PA-C  senna (SENOKOT) 8.6 MG tablet Take 1 tablet by mouth daily.    [provider]  tamsulosin (FLOMAX) 0.4 MG CAPS capsule Take 1 capsule (0.4 mg total) by mouth daily. 02/01/19   Elgie Collard, PA-C    Family History Family History  Problem Relation Age of Onset  . Hyperlipidemia Mother   .  Diabetes Mother   . Liver disease Mother        NASH  . Hyperlipidemia Father   . Diabetes Father   . Heart disease Father   . Hypertension Father     Social History Social History   Tobacco Use  . Smoking status: Former Smoker    Types: Cigarettes    Quit date: 03/14/2008    Years since quitting: 10.9  .  Smokeless tobacco: Never Used  Substance Use Topics  . Alcohol use: Yes    Comment: OCCAS  . Drug use: No     Allergies   Patient has no known allergies.   Review of Systems Review of Systems All other systems negative except as documented in the HPI. All pertinent positives and negatives as reviewed in the HPI.  Physical Exam Updated Vital Signs BP 117/70   Pulse (!) 57   Temp 97.8 F (36.6 C) (Oral)   Resp (!) 8   SpO2 99%   Physical Exam Vitals signs and nursing note reviewed.  Constitutional:      General: He is not in acute distress.    Appearance: He is well-developed.  HENT:     Head: Normocephalic and atraumatic.  Eyes:     Pupils: Pupils are equal, round, and reactive to light.  Cardiovascular:     Rate and Rhythm: Normal rate and regular rhythm.  Pulmonary:     Effort: Pulmonary effort is normal.  Musculoskeletal:       Legs:  Skin:    General: Skin is warm and dry.  Neurological:     Mental Status: He is alert and oriented to person, place, and time.      ED Treatments / Results  Labs (all labs ordered are listed, but only abnormal results are displayed) Labs Reviewed  CBG MONITORING, ED - Abnormal; Notable for the following components:      Result Value   Glucose-Capillary 148 (*)    All other components within normal limits  CBG MONITORING, ED - Abnormal; Notable for the following components:   Glucose-Capillary 168 (*)    All other components within normal limits  BASIC METABOLIC PANEL  CBC WITH DIFFERENTIAL/PLATELET  LACTIC ACID, PLASMA  LACTIC ACID, PLASMA    EKG None  Radiology No results found.  Procedures  Procedures (including critical care time)  Medications Ordered in ED Medications - No data to display   Initial Impression / Assessment and Plan / ED Course  I have reviewed the triage vital signs and the nursing notes.  Pertinent labs & imaging results that were available during my care of the patient were reviewed by me and considered in my medical decision making (see chart for details).       Patient is well-appearing and apparently were having difficulty getting screening labs on the patient.  The patient is stable here in the emergency department thus far.  The patient does not have what appears to be any significant infection in this area but will look at laboratory testing to make sure there is no significant abnormalities with that.  Final Clinical Impressions(s) / ED Diagnoses   Final diagnoses:  None    ED Discharge Orders    None       Dalia Heading, PA-C 03/09/19 1540    Dorie Rank, MD 03/09/19 1651

## 2019-03-09 NOTE — Telephone Encounter (Signed)
I called pt to see if pt is agreeable to get COVID test and referral to wound care doctor.  Called both cell and home numbers listed in pt chart.  No answer at either numbers.  LMTCB.

## 2019-03-09 NOTE — ED Notes (Signed)
Patient Alert and oriented to baseline. Stable and ambulatory to baseline. Patient verbalized understanding of the discharge instructions.  Patient belongings were taken by the patient.   

## 2019-03-09 NOTE — Telephone Encounter (Signed)
Called and spoke to pt's wife.  Advised pt to go ED per Dr. Aundra Dubin (who is covering for Dr. [name] today) since pt had spiked fever and may need IV antibiotics. Also informed pt that Dr. Aundra Dubin wants to make an urgent referral to Dr. Linton Rump, pt's wound care doctor.   Also informed pt's wife that Dr. Derrel Nip was recommending pt get re-tested for COVID.  Pt's wife said that pt did not need COVID test since he was tested on Nov 10 and was negative.  Recommended pt to go to ED since pt had sick symptoms in less than 24 hours and [facility] are not seeing pts in person w/ COVID symptoms. Pt's wife was upset and said that pt no longer has symptoms this morning and only needs someone to look at his leg.   Pt's wife said that she would take pt to ER and hung up phone.  Appt was cancelled at [facility].

## 2019-03-09 NOTE — ED Notes (Signed)
Labs delayed due to access difficulty

## 2019-03-09 NOTE — Telephone Encounter (Signed)
I was unaware that he had an ulcer on his stump since dr Larose Kells said he did not.!   If there is one,  I agree with sending him back to wound clinic  But they may require a covid  test anyway given his new onset  Fever,  so i would send hi for teting as well

## 2019-03-09 NOTE — Telephone Encounter (Signed)
Call wife back if fever is related to wound rec wound clinic referral as he has an ulcer and has been septic due to wound  Wife is taking pt to the ED hung up on [place] and also hung up on me   Called wife back she is agreeable to wound clinic referral declines to see [facility] Dr. Linton Rump  Will refer to Dr. Theodoro Kos [place] pt s/p AKA or BKA   Referral coming plastics for wound   [place]

## 2019-03-09 NOTE — Telephone Encounter (Signed)
Chase Clark,  Please urge Chase Clark to get COVID TESTED today at [facility].  He was treated by phone last night for new onset fever with doxycycline,  Did not want to risk going to ER  In the setting of recent CABG (his wife is [name] and she is an Therapist, sports)    I ordered the test.

## 2019-03-09 NOTE — Telephone Encounter (Signed)
Called and spoke to patient's wife.  Pt's wife believes that avulsion on pt's left stump may be infected.  Pt said that pt had a fever of 101 last night.  Pt's wife said that the on-call physician called in doxycycline and pt has started med.  Pt's wife said that area is red and warm to touch.  Pt said that she sent a picture through [place].  Inquired if pt was seeing a specialist.  Pt's wife said that pt is not seeing a specialist and hasn't in over a year and a half.  Pt's wife requested to have an appt and prefers not to take patient to ER.  Pt's wife said that pt does not have a fever today.  Pt was screened for COVID, pt's wife denies that pt is having any COVID symptoms.  Pt was tested for COVID on November 10 and results were negative.  Denies travel, denies exposure. This clinic has no available appts today.  Pt scheduled an in office visit at [facility] with Dr. Edilia Bo @ 10:20 am.

## 2019-03-09 NOTE — Telephone Encounter (Signed)
Copied from [place] [phone]. Topic: General - Other >> Mar 09, 2019  8:18 AM Leward Quan A wrote: Reason for CRM: Patient wife called to say that he has an avulsion on his left stump and she is worried that it may be infected. She will be sending a pic on My Chart so Dr can decide if it need to be seen further. Asking for a call back from Dr [name] nurse at Ph# [phone]

## 2019-03-09 NOTE — ED Provider Notes (Signed)
5:00 PM signout from [place] PA-C/Dr. Tomi Bamberger at shift change.  Patient here with wound to his left lower extremity stump.  Patient has started doxycycline.  Was encouraged to come to the ED by his primary care doctor.  He had a fever yesterday and last night but no fevers today.  White blood cell count is normal.  Red blood cell count is improving, patient recently received IV iron transfusion and appears to be responding well to that.  Kidney function chronically elevated but is stable.  Patient looks well.  He denies other symptoms of illness such as URI or flulike illness, chest pain, chest tightness or cough, abdominal pain, or urinary symptoms.  Patient looks very well.  I evaluated the lower extremity wound.  There is mild erythema surrounding the wound but no drainage or crepitus.  No palpable abscess or areas of induration.  If there is cellulitis, it is mild at this time.  Patient will continue doxycycline.  Will perform send out Covid testing to facilitate future follow-up.  Patient is being referred to wound care/plastics.  Patient's wife is a Marine scientist.  They are very reliable to return with worsening symptoms and we discussed signs and symptoms to return.  Pt urged to return with worsening pain, worsening swelling, expanding area of redness or streaking up extremity, fever, or any other concerns. Urged to take complete course of antibiotics as prescribed. Pt verbalizes understanding and agrees with plan.  BP 117/70   Pulse (!) 57   Temp 97.8 F (36.6 C) (Oral)   Resp (!) 8   SpO2 99%     Carlisle Cater, PA-C 03/09/19 1704    Gareth Morgan, MD 03/14/19 2259

## 2019-03-10 LAB — NOVEL CORONAVIRUS, NAA (HOSP ORDER, SEND-OUT TO REF LAB; TAT 18-24 HRS): SARS-CoV-2, NAA: NOT DETECTED

## 2019-03-11 ENCOUNTER — Other Ambulatory Visit: Payer: Self-pay

## 2019-03-11 ENCOUNTER — Ambulatory Visit (HOSPITAL_COMMUNITY)
Admission: RE | Admit: 2019-03-11 | Discharge: 2019-03-11 | Disposition: A | Discharge status: Home / Self Care | Payer: BC Managed Care – PPO | Source: Ambulatory Visit | Attending: Nephrology | Admitting: Nephrology

## 2019-03-11 DIAGNOSIS — D631 Anemia in chronic kidney disease: Secondary | ICD-10-CM | POA: Insufficient documentation

## 2019-03-11 DIAGNOSIS — N189 Chronic kidney disease, unspecified: Secondary | ICD-10-CM | POA: Insufficient documentation

## 2019-03-11 MED ORDER — SODIUM CHLORIDE 0.9 % IV SOLN
510.0000 mg | INTRAVENOUS | Status: DC
  Administered 2019-03-11: 510 mg via INTRAVENOUS
  Filled 2019-03-11: qty 17

## 2019-03-14 ENCOUNTER — Other Ambulatory Visit: Payer: Self-pay | Admitting: Thoracic Surgery (Cardiothoracic Vascular Surgery)

## 2019-03-14 ENCOUNTER — Telehealth: Payer: Self-pay | Admitting: Internal Medicine

## 2019-03-14 ENCOUNTER — Ambulatory Visit: Payer: BC Managed Care – PPO | Admitting: Pharmacist

## 2019-03-14 ENCOUNTER — Other Ambulatory Visit: Payer: Self-pay | Admitting: Internal Medicine

## 2019-03-14 DIAGNOSIS — D631 Anemia in chronic kidney disease: Secondary | ICD-10-CM | POA: Diagnosis not present

## 2019-03-14 DIAGNOSIS — T8149XA Infection following a procedure, other surgical site, initial encounter: Secondary | ICD-10-CM

## 2019-03-14 DIAGNOSIS — IMO0002 Reserved for concepts with insufficient information to code with codable children: Secondary | ICD-10-CM

## 2019-03-14 DIAGNOSIS — N184 Chronic kidney disease, stage 4 (severe): Secondary | ICD-10-CM | POA: Diagnosis not present

## 2019-03-14 DIAGNOSIS — I129 Hypertensive chronic kidney disease with stage 1 through stage 4 chronic kidney disease, or unspecified chronic kidney disease: Secondary | ICD-10-CM | POA: Diagnosis not present

## 2019-03-14 DIAGNOSIS — E1139 Type 2 diabetes mellitus with other diabetic ophthalmic complication: Secondary | ICD-10-CM

## 2019-03-14 DIAGNOSIS — R809 Proteinuria, unspecified: Secondary | ICD-10-CM | POA: Diagnosis not present

## 2019-03-14 NOTE — Chronic Care Management (AMB) (Signed)
Chronic Care Management   Follow Up Note   03/14/2019 Name: Chase Clark MRN: [id number] DOB: June 30, 1965  Referred by: [name], MD Reason for referral : Chronic Care Management (Medication Management)   Chase Clark is a 53 y.o. year old male who is a primary care patient of [name], [name], MD. The CCM team was consulted for assistance with chronic disease management and care coordination needs.    Contacted patient for medication management review. Spoke with he and his wife, Chase Clark.   Review of patient status, including review of consultants reports, relevant laboratory and other test results, and collaboration with appropriate care team members and the patient's provider was performed as part of comprehensive patient evaluation and provision of chronic care management services.    SDOH (Social Determinants of Health) screening performed today: Financial Strain  Stress. See Care Plan for related entries.   Outpatient Encounter Medications as of 03/14/2019  Medication Sig  . acetaminophen (TYLENOL) 325 MG tablet Take 1 tablet (325 mg total) by mouth every 6 (six) hours as needed for mild pain (pain score 1-3 or temp > 100.5).  Marland Kitchen aspirin (ASPIRIN CHILDRENS) 81 MG chewable tablet Chew 1 tablet (81 mg total) by mouth daily.  Marland Kitchen atorvastatin (LIPITOR) 80 MG tablet Take 1 tablet (80 mg total) by mouth daily.  . brimonidine-timolol (COMBIGAN) 0.2-0.5 % ophthalmic solution Place 1 drop into the right eye every 12 (twelve) hours.   . cholecalciferol (VITAMIN D3) 25 MCG (1000 UT) tablet Take 1,000 Units by mouth daily.  . Continuous Blood Gluc Receiver (DEXCOM G6 RECEIVER) DEVI 1 Device by Does not apply route 4 (four) times daily. Use to check blood sugars up to 4 times daily  . Continuous Blood Gluc Sensor (DEXCOM G6 SENSOR) MISC 1 Device by Does not apply route 4 (four) times daily. Place a new sensor every 10 days to check blood sugar up to 4 times daily  . Continuous  Blood Gluc Transmit (DEXCOM G6 TRANSMITTER) MISC 1 applicator by Does not apply route 4 (four) times daily.  Marland Kitchen doxycycline (VIBRA-TABS) 100 MG tablet Take 1 tablet (100 mg total) by mouth 2 (two) times daily.  Marland Kitchen esomeprazole (NEXIUM) 20 MG capsule Take 20 mg by mouth daily.   . fexofenadine (ALLEGRA) 180 MG tablet Take 180 mg by mouth daily.  Marland Kitchen gabapentin (NEURONTIN) 300 MG capsule Take 1 capsule (300 mg total) by mouth 2 (two) times daily.  . hydrALAZINE (APRESOLINE) 25 MG tablet Take 1 tablet (25 mg total) by mouth 3 (three) times daily.  . insulin aspart (NOVOLOG) 100 UNIT/ML injection Inject up to 15 units up to 3 times daily with meals  . Insulin Glargine (LANTUS) 100 UNIT/ML Solostar Clark Inject 40 Units into the skin daily. (Patient taking differently: Inject 40 Units into the skin 2 (two) times daily. )  . Insulin Clark Needle 32G X 4 MM MISC Inject 5 pens into the skin daily. Use to inject Lantus and Novolog up to 5 times daily  . Lactulose 20 GM/30ML SOLN Take 30 mLs (20 g total) by mouth as needed.  . latanoprost (XALATAN) 0.005 % ophthalmic solution Place 1 drop into both eyes at bedtime.  Marland Kitchen lisinopril (ZESTRIL) 5 MG tablet Take 5 mg by mouth daily.  . metoprolol tartrate (LOPRESSOR) 50 MG tablet Take 50 mg by mouth 2 (two) times daily.  Marland Kitchen senna (SENOKOT) 8.6 MG tablet Take 1 tablet by mouth daily.  . tamsulosin (FLOMAX) 0.4 MG CAPS capsule  Take 1 capsule (0.4 mg total) by mouth daily.  Marland Kitchen oxyCODONE (OXY IR/ROXICODONE) 5 MG immediate release tablet Take 1 tablet (5 mg total) by mouth every 6 (six) hours as needed for severe pain. (Patient not taking: Reported on 03/04/2019)  . [DISCONTINUED] amLODipine (NORVASC) 5 MG tablet Take 0.5 tablets (2.5 mg total) by mouth daily.  . [DISCONTINUED] fluticasone (FLONASE) 50 MCG/ACT nasal spray Place 2 sprays into both nostrils daily. (Patient not taking: Reported on 03/14/2019)  . [DISCONTINUED] lactulose (CHRONULAC) 10 GM/15ML solution Take 15 mLs  (10 g total) by mouth daily as needed for mild constipation.   No facility-administered encounter medications on file as of 03/14/2019.      Goals Addressed            This Visit's Progress     Patient Stated   . "I have a lot going on with my health" (pt-stated)       Current Barriers:  . 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 Wife, Chase Clark, is a Marine scientist in cardiology and manages his medications. o ASCVD risk reduction: metoprolol 50 mg BID, hydralazine 25 mg TID- wife notes that nephrologist stopped amlodipine and started lisinopril yesterday; atorvastatin 80 mg daily; last LDL 72; ASA 81 mg daily  o T2DM: uncontrolled but improved per BG, last A1c 9.3%; Lantus 40 units BID, Humalog sliding scale 5-10 units TID with meals; see DM note o Constipation: senna PRN; lactulose PRN - wife reports he has only needed lactulose twice since being prescribed o Skin ulcer - recent ulcer on L BKA stump, tx doxycycline. Went to ED for physical review, doxycycline therapy continued. Notes 2 more days of therapy. He and his wife agree that wound is improving, and fever resolved. Referral for wound care placed.   Pharmacist Clinical Goal(s):  Marland Kitchen Over the next 90 days, patient will work with PharmD and provider towards optimized medication management  Interventions: . Comprehensive medication review performed, medication list updated in electronic medical record  . Per Dr. [name], patient needs to be scheduled for f/u lipid panel. Message had been left w/ patient to schedule this. Will follow up on this with future lab work, consider addition of ezetimibe if LDL not at goal <70  Patient Self Care Activities:  . Patient will take medications as prescribed  Please see past updates related to this goal by clicking on the "Past Updates" button in the selected goal      . "I want to control  my diabetes" (pt-stated)       Current Barriers:  . Diabetes: uncontrolled; most recent A1c 9.3%  o Has picked up Dexcom meter, but has not started using yet  . Current antihyperglycemic regimen: Lantus 40 units BID, Humalog sliding scale 5-10 units based on pre-meal sugars  . Reports hypoglycemic episodes o Related to days his stomach is upset (constipation) o One episode of the 50s, a few episodes in the 60s; notes he starts "feeling bad" when sugars <70 . Current meal patterns: o Breakfast: Fruit, bagel w/ spread; occasional egg  o Lunch: Avoiding red meat, focusing on fresh vegetables; [place] sandwich and some potato salad and pickles o Drinks: Drinking water, unsweet tea . Current blood glucose readings:  o Fasting: 110s o 2 hour post prandial: all staying less than 180 . Cardiovascular risk reduction: o Current hypertensive regimen: lisinopril 5 mg daily, metoprolol tartrate 50 mg BID, hydralazine 25  mg TID o Current hyperlipidemia regimen: atorvastatin 80 mg daily   Pharmacist Clinical Goal(s):  Marland Kitchen Over the next 90 days, patient will work with PharmD and primary care provider to address optimized glycemic benefit  Interventions: . Comprehensive medication review performed, medication list updated in electronic medical record . Discussed goal A1c, goal fasting glucose and goal 2 hour post prandial glucose . Discussed avoidance of hypoglycemia by not taking mealtime insulin if patient skips a meal, or eats less than usual. Patient verbalized understanding . Moving forward, appropriate to consider options to reduce insulin burden and provide ASCVD risk reduction. Discussed addition of GLP1; patient would like to continue current regimen for now.  . Patient due for A1c in January. F/u with Dr. [name] in February. Patient's wife request having labwork, particularly A1c, checked prior to this appointment so that this can be taken into account with the appointment.   Patient Self Care  Activities:  . Patient will check blood glucose up to QID w/ Dexcom , document, and provide at future appointment . Patient will take medications as prescribed . Patient will report any questions or concerns to provider   Initial goal documentation        Plan: - Scheduled f/u with patient in ~5-6 weeks for continued medication management support  Catie Darnelle Maffucci, PharmD, [place], CPP Clinical Pharmacist [facility] [place] [phone]

## 2019-03-14 NOTE — Patient Instructions (Signed)
Visit Information  Goals Addressed            This Visit's Progress     Patient Stated   . "I have a lot going on with my health" (pt-stated)       Current Barriers:  . 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 Wife, [name], is a Marine scientist in cardiology and manages his medications. o ASCVD risk reduction: metoprolol 50 mg BID, hydralazine 25 mg TID- wife notes that nephrologist stopped amlodipine and started lisinopril yesterday; atorvastatin 80 mg daily; last LDL 72; ASA 81 mg daily  o T2DM: uncontrolled but improved per BG, last A1c 9.3%; Lantus 40 units BID, Humalog sliding scale 5-10 units TID with meals; see DM note o Constipation: senna PRN; lactulose PRN - wife reports he has only needed lactulose twice since being prescribed o Skin ulcer - recent ulcer on L BKA stump, tx doxycycline. Went to ED for physical review, doxycycline therapy continued. Notes 2 more days of therapy. He and his wife agree that wound is improving, and fever resolved. Referral for wound care placed.   Pharmacist Clinical Goal(s):  Marland Kitchen Over the next 90 days, patient will work with PharmD and provider towards optimized medication management  Interventions: . Comprehensive medication review performed, medication list updated in electronic medical record  . Per Dr. [name], patient needs to be scheduled for f/u lipid panel. Message had been left w/ patient to schedule this. Will follow up on this with future lab work, consider addition of ezetimibe if LDL not at goal <70  Patient Self Care Activities:  . Patient will take medications as prescribed  Please see past updates related to this goal by clicking on the "Past Updates" button in the selected goal      . "I want to control my diabetes" (pt-stated)       Current Barriers:  . Diabetes: uncontrolled; most recent A1c 9.3%  o Has picked up Dexcom  meter, but has not started using yet  . Current antihyperglycemic regimen: Lantus 40 units BID, Humalog sliding scale 5-10 units based on pre-meal sugars  . Reports hypoglycemic episodes o Related to days his stomach is upset (constipation) o One episode of the 50s, a few episodes in the 60s; notes he starts "feeling bad" when sugars <70 . Current meal patterns: o Breakfast: Fruit, bagel w/ spread; occasional egg  o Lunch: Avoiding red meat, focusing on fresh vegetables; [place] sandwich and some potato salad and pickles o Drinks: Drinking water, unsweet tea . Current blood glucose readings:  o Fasting: 110s o 2 hour post prandial: all staying less than 180 . Cardiovascular risk reduction: o Current hypertensive regimen: lisinopril 5 mg daily, metoprolol tartrate 50 mg BID, hydralazine 25 mg TID o Current hyperlipidemia regimen: atorvastatin 80 mg daily   Pharmacist Clinical Goal(s):  Marland Kitchen Over the next 90 days, patient will work with PharmD and primary care provider to address optimized glycemic benefit  Interventions: . Comprehensive medication review performed, medication list updated in electronic medical record . Discussed goal A1c, goal fasting glucose and goal 2 hour post prandial glucose . Discussed avoidance of hypoglycemia by not taking mealtime insulin if patient skips a meal, or eats less than usual. Patient verbalized understanding . Moving forward, appropriate to consider options to reduce insulin burden and provide ASCVD risk reduction. Discussed addition of GLP1; patient would like to continue current regimen for now.  Marland Kitchen  Patient due for A1c in January. F/u with Dr. [name] in February. Patient's wife request having labwork, particularly A1c, checked prior to this appointment so that this can be taken into account with the appointment.   Patient Self Care Activities:  . Patient will check blood glucose up to QID w/ Dexcom , document, and provide at future  appointment . Patient will take medications as prescribed . Patient will report any questions or concerns to provider   Initial goal documentation        The patient verbalized understanding of instructions provided today and declined a print copy of patient instruction materials.   Plan: - Scheduled f/u with patient in ~5-6 weeks for continued medication management support  Catie Darnelle Maffucci, PharmD, [place], CPP Clinical Pharmacist [place] [phone]

## 2019-03-14 NOTE — Telephone Encounter (Signed)
Pt called  For appt 03/09/19 running fever had been given Doxycycline covid negative 02/22/19 Still running fever and suspected wound infection  Spoke with wife who initially hung up the phone on me as she was instructed to go to the ED I.e iv antibiotics since he has been critically ill with wound infections before. She stated she was getting him in the car and hung up   Called wife back 03/09/19 and disc referral to wound doctor Dr. 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 Lyndee Leo Sanger [place] shes agreeable to this and ED for now   Referral placed   [place]

## 2019-03-15 ENCOUNTER — Ambulatory Visit: Payer: BC Managed Care – PPO

## 2019-03-15 NOTE — Addendum Note (Signed)
Addended by: [name] on: 03/15/2019 02:43 PM   Modules accepted: Orders

## 2019-03-15 NOTE — Progress Notes (Signed)
I have reviewed the above note and agree. I was available to the pharmacist for consultation. Labs ordered.   [name], MD

## 2019-03-17 ENCOUNTER — Telehealth: Payer: Self-pay

## 2019-03-17 DIAGNOSIS — E113513 Type 2 diabetes mellitus with proliferative diabetic retinopathy with macular edema, bilateral: Secondary | ICD-10-CM | POA: Diagnosis not present

## 2019-03-17 NOTE — Telephone Encounter (Signed)
Copied from [place] [id number]. Topic: General - Call Back - No Documentation >> Mar 17, 2019  4:35 PM Erick Blinks wrote: Best contact [phone] Pt's wife called requesting call from nurse, has questions about referral. Please advise  I called and spoke with the patient's wife and confirmed that the referral was in place.Chase Clark

## 2019-03-18 NOTE — Telephone Encounter (Signed)
Patient has lab appointment 04/22/19.

## 2019-03-21 ENCOUNTER — Encounter: Payer: Self-pay | Admitting: Plastic Surgery

## 2019-03-21 ENCOUNTER — Other Ambulatory Visit: Payer: Self-pay

## 2019-03-21 ENCOUNTER — Ambulatory Visit (INDEPENDENT_AMBULATORY_CARE_PROVIDER_SITE_OTHER): Payer: BC Managed Care – PPO | Admitting: Plastic Surgery

## 2019-03-21 VITALS — BP 113/74 | HR 64 | Temp 97.1°F | Ht 70.0 in

## 2019-03-21 DIAGNOSIS — T8740 Infection of amputation stump, unspecified extremity: Secondary | ICD-10-CM

## 2019-03-21 MED ORDER — SULFAMETHOXAZOLE-TRIMETHOPRIM 800-160 MG PO TABS: 1.0000 | ORAL_TABLET | Freq: Two times a day (BID) | ORAL | 0 refills | Status: DC

## 2019-03-21 NOTE — Progress Notes (Signed)
Referring Provider [name], MD [address] [place],  [place] [address]   CC:  Chief Complaint  Patient presents with  . Advice Only    for wound on (L) leg stump      Chase Clark is an 53 y.o. male.  HPI: Patient presents with around a 2-week history of erythema and blistering for his left below-knee amputation stump.  He has seen his primary care physician is prescribed doxycycline course which he just finished.  He is not having much in the way of pain.  Has not had any fevers.  Amputation was result of an infection in his heel.  He is a diabetic.  He had a heart bypass 2 months ago  No Known Allergies  Outpatient Encounter Medications as of 03/21/2019  Medication Sig  . acetaminophen (TYLENOL) 325 MG tablet Take 1 tablet (325 mg total) by mouth every 6 (six) hours as needed for mild pain (pain score 1-3 or temp > 100.5).  Marland Kitchen aspirin (ASPIRIN CHILDRENS) 81 MG chewable tablet Chew 1 tablet (81 mg total) by mouth daily.  Marland Kitchen atorvastatin (LIPITOR) 80 MG tablet Take 1 tablet (80 mg total) by mouth daily.  . brimonidine-timolol (COMBIGAN) 0.2-0.5 % ophthalmic solution Place 1 drop into the right eye every 12 (twelve) hours.   . cholecalciferol (VITAMIN D3) 25 MCG (1000 UT) tablet Take 1,000 Units by mouth daily.  . Continuous Blood Gluc Receiver (DEXCOM G6 RECEIVER) DEVI 1 Device by Does not apply route 4 (four) times daily. Use to check blood sugars up to 4 times daily  . Continuous Blood Gluc Sensor (DEXCOM G6 SENSOR) MISC 1 Device by Does not apply route 4 (four) times daily. Place a new sensor every 10 days to check blood sugar up to 4 times daily  . Continuous Blood Gluc Transmit (DEXCOM G6 TRANSMITTER) MISC 1 applicator by Does not apply route 4 (four) times daily.  Marland Kitchen esomeprazole (NEXIUM) 20 MG capsule Take 20 mg by mouth daily.   . fexofenadine (ALLEGRA) 180 MG tablet Take 180 mg by mouth daily.  Marland Kitchen gabapentin (NEURONTIN) 300 MG capsule Take 1 capsule (300  mg total) by mouth 2 (two) times daily.  . hydrALAZINE (APRESOLINE) 25 MG tablet Take 1 tablet (25 mg total) by mouth 3 (three) times daily.  . insulin aspart (NOVOLOG) 100 UNIT/ML injection Inject up to 15 units up to 3 times daily with meals  . Insulin Glargine (LANTUS) 100 UNIT/ML Solostar Pen Inject 40 Units into the skin daily. (Patient taking differently: Inject 40 Units into the skin 2 (two) times daily. )  . Insulin Pen Needle 32G X 4 MM MISC Inject 5 pens into the skin daily. Use to inject Lantus and Novolog up to 5 times daily  . Lactulose 20 GM/30ML SOLN Take 30 mLs (20 g total) by mouth as needed.  . latanoprost (XALATAN) 0.005 % ophthalmic solution Place 1 drop into both eyes at bedtime.  Marland Kitchen lisinopril (ZESTRIL) 5 MG tablet Take 5 mg by mouth daily.  . metoprolol tartrate (LOPRESSOR) 50 MG tablet Take 50 mg by mouth 2 (two) times daily.  Marland Kitchen oxyCODONE (OXY IR/ROXICODONE) 5 MG immediate release tablet Take 1 tablet (5 mg total) by mouth every 6 (six) hours as needed for severe pain.  Marland Kitchen senna (SENOKOT) 8.6 MG tablet Take 1 tablet by mouth daily.  . tamsulosin (FLOMAX) 0.4 MG CAPS capsule Take 1 capsule (0.4 mg total) by mouth daily.  Marland Kitchen sulfamethoxazole-trimethoprim (BACTRIM DS)  800-160 MG tablet Take 1 tablet by mouth 2 (two) times daily.  . [DISCONTINUED] doxycycline (VIBRA-TABS) 100 MG tablet Take 1 tablet (100 mg total) by mouth 2 (two) times daily.   No facility-administered encounter medications on file as of 03/21/2019.      Past Medical History:  Diagnosis Date  . Arthritis   . Asthma    as child  . Coronary artery disease    DES DIAG1 11/20/09 [facility])  . Diabetes mellitus without complication ([place])   . Diabetic osteomyelitis ([place])   . Diabetic ulcer of foot with muscle involvement without evidence of necrosis ([place])    DIABETIC ULCERATIONS ASSOCIATED WITH IRRITATION LATERAL ANKLE LEFT GREATER THAN RIGHT WITH MILD CELLULITIS   . Diverticulosis   . DKA (diabetic ketoacidoses)  ([place]) 08/23/2017  . Edema, lower extremity   . Fatty liver   . GERD (gastroesophageal reflux disease)   . Gilbert's disease   . Glaucoma   . Heart disease 2011   Patient has stent for 80% blockage  . Hyperlipidemia   . Hypertension   . Neuropathy   . Seasonal allergies   . Shoulder pain   . Ulcer of foot ([place]) 08.06.2014   DIABETIC ULCERATIONS ASSOCIATED WITH IRRITATION LATERAL ANKLE LEFT GREATER THAN RIGHT WITH MILD CELLULITIS    Past Surgical History:  Procedure Laterality Date  . ABDOMINAL AORTOGRAM N/A 05/20/2016   Procedure: Abdominal Aortogram possible intervention;  Surgeon: Katha Cabal, MD;  Location: [place];  Service: Cardiovascular;  Laterality: N/A;  . AMPUTATION Left 09/05/2017   Procedure: AMPUTATION BELOW KNEE;  Surgeon: Tania Ade, MD;  Location: [place] [facility];  Service: Orthopedics;  Laterality: Left;  . ANTERIOR CERVICAL DECOMP/DISCECTOMY FUSION N/A 05/07/2017   Procedure: ANTERIOR CERVICAL DECOMPRESSION/DISCECTOMY Luevenia Maxin PROSTHESIS,PLATE/SCREWS CERVICAL FIVE - CERVICAL SIX;  Surgeon: Newman Pies, MD;  Location: [place];  Service: Neurosurgery;  Laterality: N/A;  . CARDIAC CATHETERIZATION    . CATARACT EXTRACTION W/ INTRAOCULAR LENS IMPLANT    . CATARACT EXTRACTION W/PHACO Left 02/26/2017   Procedure: CATARACT EXTRACTION PHACO AND INTRAOCULAR LENS PLACEMENT (IOC);  Surgeon: Eulogio Bear, MD;  Location: [facility];  Service: Ophthalmology;  Laterality: Left;  Lot # [id number] H [place]: 00:25.3 AP%: 6.2 CDE: 1.59   . CHOLECYSTECTOMY N/A   . CORONARY ANGIOPLASTY WITH STENT PLACEMENT  2007   1st diagonal  . CORONARY ARTERY BYPASS GRAFT N/A 01/27/2019   Procedure: OFF PUMP CORONARY ARTERY BYPASS GRAFTING (CABG) X 2 WITH ENDOSCOPIC HARVESTING OF RIGHT GREATER SAPHENOUS VEIN. LIMA TO LAD;  Surgeon: Lajuana Matte, MD;  Location: [place];  Service: Open Heart Surgery;  Laterality: N/A;  . CORONARY STENT INTERVENTION N/A 01/21/2019   Procedure:  CORONARY STENT INTERVENTION;  Surgeon: Belva Crome, MD;  Location: [place];  Service: Cardiovascular;  Laterality: N/A;  . EPIBLEPHERON REPAIR WITH TEAR DUCT PROBING    . EYE SURGERY Right sept 29n2015   cataract extraction  . INSERTION EXPRESS TUBE SHUNT Right 09/06/2015   Procedure: INSERTION AHMED TUBE SHUNT with tutoplast allograft;  Surgeon: Eulogio Bear, MD;  Location: [facility];  Service: Ophthalmology;  Laterality: Right;  . LEFT HEART CATH AND CORONARY ANGIOGRAPHY N/A 01/20/2019   Procedure: LEFT HEART CATH AND CORONARY ANGIOGRAPHY;  Surgeon: Lorretta Harp, MD;  Location: [place] [facility];  Service: Cardiovascular;  Laterality: N/A;  . TEE WITHOUT CARDIOVERSION N/A 01/27/2019   Procedure: TRANSESOPHAGEAL ECHOCARDIOGRAM (TEE);  Surgeon: Lajuana Matte, MD;  Location: [place];  Service: Open Heart Surgery;  Laterality: N/A;  . VASECTOMY      Family History  Problem Relation Age of Onset  . Hyperlipidemia Mother   . Diabetes Mother   . Liver disease Mother        NASH  . Hyperlipidemia Father   . Diabetes Father   . Heart disease Father   . Hypertension Father     Social History   Social History Narrative   Used to work as Audiological scientist    Married      Review of Systems General: Denies fevers, chills, weight loss CV: Denies chest pain, shortness of breath, palpitations  Physical Exam Vitals with BMI 03/21/2019 03/11/2019 03/11/2019  Height 5\' 10"  - 5\' 10"   Weight (No Data) - 220 lbs  BMI - - 04.54  Systolic 098 119 147  Diastolic 74 85 75  Pulse 64 63 65    General:  No acute distress,  Alert and oriented, Non-Toxic, Normal speech and affect Left leg amputation stump: He has erythema and superficial blistering around the amputation stump.  In the most distal aspect there is clear fluctuance.  There is no open wounds at the moment.  The area is not particularly tender for him.  Assessment/Plan Patient presents with a abscess of his left leg  amputation stump.  Since he is insensate in that area this was able to be drained in the office today.  First an 18-gauge needle was used to try to aspirate this however it was insufficient.  The bevel of the needle was then used to make a 1 to 2 cm incision.  Copious purulence and drainage was able to be expressed.  This was cultured with a culture swab.  The area was then irrigated and packed with a ribbon gauze and covered with a soft dressing.  I will put him on Bactrim until the cultures come back.  His wife is surgical nurse and feels comfortable with doing the packing changes which she will do daily.  I will plan to follow-up with him in a week or 2 to check his progress.  Cindra Presume 03/21/2019, 12:13 PM

## 2019-03-22 ENCOUNTER — Telehealth: Payer: Self-pay

## 2019-03-22 ENCOUNTER — Telehealth: Payer: Self-pay | Admitting: Plastic Surgery

## 2019-03-22 DIAGNOSIS — L97422 Non-pressure chronic ulcer of left heel and midfoot with fat layer exposed: Secondary | ICD-10-CM | POA: Diagnosis not present

## 2019-03-22 NOTE — Telephone Encounter (Signed)
Received call from patient's wife stating she has not yet heard from [facility] regarding wound care supplies. She would like to know what the next steps are regarding obtaining supplies.

## 2019-03-22 NOTE — Telephone Encounter (Signed)
Called and spoke with the patient's wife regarding the message below.  Informed her that I spoke with Caryl Pina with [employer] and she informed me that the supplies have been shipped today.  Also informed the wife that Dr. Claudia Desanctis said she can leave the dressing on until they receive the supplies.  The wife verbalized understanding, but stated that she will go to the store and buy supplies.  She said she's a nurse and she did not want him to go without the dressing been changed daily.//AB/CMA

## 2019-03-22 NOTE — Telephone Encounter (Signed)
Last Telephone Note had incorrect name in it. [facility] called to verify insurance for pt because it was cut off in the fax they received.

## 2019-03-22 NOTE — Telephone Encounter (Signed)
Chase Clark, from [employer], called to get insurance id/group # because it was cut off on the fax they received. Verified information for her.

## 2019-03-23 ENCOUNTER — Telehealth: Payer: Self-pay | Admitting: Plastic Surgery

## 2019-03-23 NOTE — Telephone Encounter (Signed)
Pt's wife called to say that they received everything in their order except tape. She said that a prescription needs to be filled out to have insurance cover it. Please call wife to advise when it's been ordered for her.

## 2019-03-24 ENCOUNTER — Telehealth: Payer: Self-pay | Admitting: Plastic Surgery

## 2019-03-24 NOTE — Telephone Encounter (Signed)
Called and spoke with the patient's wife Chase Clark) regarding the message below.  She stated that they did not received the tape from [employer].  Informed the wife that she can give [employer] a call and let them know that they did not receive the tape.  She stated that she thought we had to call them.  Informed her that she can give [employer] a call and let them know that they did not receive the tape.  She verbalized understanding and agreed.//AB/CMA

## 2019-03-24 NOTE — Telephone Encounter (Signed)
Pt's wife had a few follow up questions from previous conversation this morning. Please call back on mobile.

## 2019-03-25 ENCOUNTER — Telehealth: Payer: Self-pay | Admitting: *Deleted

## 2019-03-25 NOTE — Telephone Encounter (Signed)
Patient's wife called to say they did not get the tape from [employer].  Informed her that I will give [employer] a call and to see if they may have missed putting the tape in the order.  She verbalized understanding and agreed.  Called [employer] and spoke with Kennyth Lose regarding the patient's wife calling and saying they did not receive the tape.  She stated that they have tried to reach out to the patient but unable to reach anyone.  I informed Kennyth Lose that I asked the patient's wife to give them a call to let them know that they did not receive the tape.  Kennyth Lose stated that the patient's insurance Nurse, mental health) does not cover the tape, but the patient can still get the tape it cost a $1.00.  Informed Kennyth Lose that I will give the patient's wife a call back and inform her of this information.  Called the patient's wife back and informed her of the call with Kennyth Lose with [employer], and she stated that if she gets a call from a number she do not know she will not answer it.   She stated that she will try to go out and find the tape.//AB/CMA

## 2019-03-26 LAB — AEROBIC/ANAEROBIC CULTURE W GRAM STAIN (SURGICAL/DEEP WOUND)

## 2019-03-29 ENCOUNTER — Other Ambulatory Visit: Payer: Self-pay | Admitting: Thoracic Surgery (Cardiothoracic Vascular Surgery)

## 2019-03-31 ENCOUNTER — Ambulatory Visit (INDEPENDENT_AMBULATORY_CARE_PROVIDER_SITE_OTHER): Payer: BC Managed Care – PPO | Admitting: Plastic Surgery

## 2019-03-31 ENCOUNTER — Other Ambulatory Visit: Payer: Self-pay

## 2019-03-31 ENCOUNTER — Encounter: Payer: Self-pay | Admitting: Plastic Surgery

## 2019-03-31 VITALS — BP 116/73 | HR 98 | Temp 97.5°F | Ht 70.0 in | Wt 215.0 lb

## 2019-03-31 DIAGNOSIS — T8740 Infection of amputation stump, unspecified extremity: Secondary | ICD-10-CM

## 2019-03-31 NOTE — Progress Notes (Signed)
   Referring Provider [name], MD [address] [place],  [place] [address]   CC:  Chief Complaint  Patient presents with  . Follow-up    Patient here for follow up visit from infection of left leg stump      Chase Clark is an 53 y.o. male.  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  He feels like things are going great and his wife feels like the wound is mostly healed up.  There is very little left to pack at this point.  He wants to know if he can start walking again with his prosthetic.  Review of Systems General: Denies fevers, chills, changes in weight  Physical Exam Vitals with BMI 03/31/2019 03/21/2019 03/11/2019  Height 5\' 10"  5\' 10"  -  Weight 215 lbs (No Data) -  BMI 81.01 - -  Systolic 751 025 852  Diastolic 73 74 85  Pulse 98 64 63    General:  No acute distress,  Alert and oriented, Non-Toxic, Normal speech and affect On exam the area looks much improved.  There is a very small puncture site with almost no cavity beneath.  There is no fluctuance, drainage, surrounding erythema.  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  He is going to finish his course of Bactrim and then that should be all he needs.  I have asked him to follow-up as needed.  Cindra Presume 03/31/2019, 9:46 AM

## 2019-04-05 ENCOUNTER — Other Ambulatory Visit: Payer: Self-pay | Admitting: Thoracic Surgery (Cardiothoracic Vascular Surgery)

## 2019-04-14 DIAGNOSIS — I200 Unstable angina: Secondary | ICD-10-CM | POA: Diagnosis not present

## 2019-04-14 DIAGNOSIS — I10 Essential (primary) hypertension: Secondary | ICD-10-CM | POA: Diagnosis not present

## 2019-04-14 DIAGNOSIS — I2510 Atherosclerotic heart disease of native coronary artery without angina pectoris: Secondary | ICD-10-CM | POA: Diagnosis not present

## 2019-04-14 DIAGNOSIS — I739 Peripheral vascular disease, unspecified: Secondary | ICD-10-CM | POA: Diagnosis not present

## 2019-04-20 DIAGNOSIS — E113513 Type 2 diabetes mellitus with proliferative diabetic retinopathy with macular edema, bilateral: Secondary | ICD-10-CM | POA: Diagnosis not present

## 2019-04-21 ENCOUNTER — Other Ambulatory Visit: Payer: Self-pay

## 2019-04-22 ENCOUNTER — Other Ambulatory Visit: Payer: BC Managed Care – PPO

## 2019-04-26 ENCOUNTER — Other Ambulatory Visit: Payer: BC Managed Care – PPO

## 2019-04-28 ENCOUNTER — Ambulatory Visit: Payer: BC Managed Care – PPO | Admitting: Pharmacist

## 2019-04-28 DIAGNOSIS — E785 Hyperlipidemia, unspecified: Secondary | ICD-10-CM

## 2019-04-28 DIAGNOSIS — IMO0002 Reserved for concepts with insufficient information to code with codable children: Secondary | ICD-10-CM

## 2019-04-28 DIAGNOSIS — E1139 Type 2 diabetes mellitus with other diabetic ophthalmic complication: Secondary | ICD-10-CM

## 2019-04-28 DIAGNOSIS — E1169 Type 2 diabetes mellitus with other specified complication: Secondary | ICD-10-CM

## 2019-04-28 MED ORDER — INSULIN GLARGINE 100 UNIT/ML SOLOSTAR PEN: 40.0000 [IU] | PEN_INJECTOR | Freq: Two times a day (BID) | SUBCUTANEOUS | 3 refills | Status: DC

## 2019-04-28 NOTE — Patient Instructions (Signed)
Visit Information  Goals Addressed            This Visit's Progress     Patient Stated   . "I want to control my diabetes" (pt-stated)       Current Barriers:  . Diabetes: uncontrolled but improved; most recent A1c 9.3%, due for lab work next month o He and wife note loving using DexCom CGM.  o Notes significant healing of stump wound, but that lack of physical activity over the past few months has led to some weight gain . Current antihyperglycemic regimen: Lantus 40 units BID, Humalog sliding scale - though generally using 2-10 units only with larger meals . Reports hypoglycemic episodes; occasional episodes of BG <70 if patient skips breakfast and eats a late lunch. Patient notes he doesn't get concerned until BG <50, wife notes that she is concerned if <80 . Current meal patterns: o Notes that he does struggle with eating appropriate portion sizes . Current blood glucose readings:  o Fasting: 110s o 2 hour post prandial: a . 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 Comprehensive medication review performed, medication list updated in electronic medical record . Reviewed risks of hypoglycemia w/ patient, and that goal is to keep BG between 80-180. Recommend incorporating protein snacks to help maintain BG, if he skips breakfast.  . Goal to reduce need for insulin moving forward. Recommend addition of GLP1 for glycemic and cardiovascular benefit. Patient has lab work and f/u with PCP in a few weeks, will defer  any changes until that time. Addition of GLP1 will likely allow for removal of bolus insulin need, and reduction in basal dosing. Counseled on GLP1 mechanisms, benefits, side effects . Recommend addition of ezetimibe if LDL is not at goal <70 . Wife noted that they are interested in implantable glucose sensor. Noted that this would need to be done through endocrinology. Recommended they discuss this with Dr. [name] at upcoming appointment.  Patient Self Care Activities:  . Patient will check blood glucose up to QID w/ Dexcom , document, and provide at future appointment . Patient will take medications as prescribed . Patient will report any questions or concerns to provider   Please see past updates related to this goal by clicking on the "Past Updates" button in the selected goal         The patient verbalized understanding of instructions provided today and declined a print copy of patient instruction materials.   Plan: - Scheduled telephonic outreach 06/23/19  Catie Darnelle Maffucci, PharmD, [place], CPP Clinical Pharmacist [place] [phone]

## 2019-04-28 NOTE — Chronic Care Management (AMB) (Signed)
Chronic Care Management   Follow Up Note   04/28/2019 Name: Chase Clark MRN: [id number] DOB: 24-Mar-1966  Referred by: [name], MD Reason for referral : Chronic Care Management (Medication Management)   Chase Clark is a 55 y.o. year old male who is a primary care patient of [name], [name], MD. The CCM team was consulted for assistance with chronic disease management and care coordination needs.    Contacted patient and his wife, Chase Clark, for medication management review.  Review of patient status, including review of consultants reports, relevant laboratory and other test results, and collaboration with appropriate care team members and the patient's provider was performed as part of comprehensive patient evaluation and provision of chronic care management services.    SDOH (Social Determinants of Health) screening performed today: Physical Activity. See Care Plan for related entries.   Outpatient Encounter Medications as of 04/28/2019  Medication Sig Note  . Continuous Blood Gluc Receiver (DEXCOM G6 RECEIVER) DEVI 1 Device by Does not apply route 4 (four) times daily. Use to check blood sugars up to 4 times daily   . Continuous Blood Gluc Sensor (DEXCOM G6 SENSOR) MISC 1 Device by Does not apply route 4 (four) times daily. Place a new sensor every 10 days to check blood sugar up to 4 times daily   . Continuous Blood Gluc Transmit (DEXCOM G6 TRANSMITTER) MISC 1 applicator by Does not apply route 4 (four) times daily.   Marland Kitchen acetaminophen (TYLENOL) 325 MG tablet Take 1 tablet (325 mg total) by mouth every 6 (six) hours as needed for mild pain (pain score 1-3 or temp > 100.5).   Marland Kitchen aspirin (ASPIRIN CHILDRENS) 81 MG chewable tablet Chew 1 tablet (81 mg total) by mouth daily.   Marland Kitchen atorvastatin (LIPITOR) 80 MG tablet Take 1 tablet (80 mg total) by mouth daily.   . brimonidine-timolol (COMBIGAN) 0.2-0.5 % ophthalmic solution Place 1 drop into the right eye every 12 (twelve) hours.     . cholecalciferol (VITAMIN D3) 25 MCG (1000 UT) tablet Take 1,000 Units by mouth daily.   Marland Kitchen esomeprazole (NEXIUM) 20 MG capsule Take 20 mg by mouth daily.    . fexofenadine (ALLEGRA) 180 MG tablet Take 180 mg by mouth daily.   Marland Kitchen gabapentin (NEURONTIN) 300 MG capsule Take 1 capsule (300 mg total) by mouth 2 (two) times daily.   . hydrALAZINE (APRESOLINE) 25 MG tablet Take 1 tablet (25 mg total) by mouth 3 (three) times daily.   . insulin aspart (NOVOLOG) 100 UNIT/ML injection Inject up to 15 units up to 3 times daily with meals (Patient not taking: Reported on 04/28/2019) 04/28/2019: 2-6 units if needed  . Insulin Glargine (LANTUS) 100 UNIT/ML Solostar Clark Inject 40 Units into the skin 2 (two) times daily.   . Insulin Clark Needle 32G X 4 MM MISC Inject 5 pens into the skin daily. Use to inject Lantus and Novolog up to 5 times daily   . Lactulose 20 GM/30ML SOLN Take 30 mLs (20 g total) by mouth as needed.   . latanoprost (XALATAN) 0.005 % ophthalmic solution Place 1 drop into both eyes at bedtime.   Marland Kitchen lisinopril (ZESTRIL) 5 MG tablet Take 5 mg by mouth daily.   . metoprolol tartrate (LOPRESSOR) 50 MG tablet Take 50 mg by mouth 2 (two) times daily.   Marland Kitchen senna (SENOKOT) 8.6 MG tablet Take 1 tablet by mouth daily.   Marland Kitchen sulfamethoxazole-trimethoprim (BACTRIM DS) 800-160 MG tablet Take 1 tablet by mouth  2 (two) times daily.   . tamsulosin (FLOMAX) 0.4 MG CAPS capsule Take 1 capsule (0.4 mg total) by mouth daily.   . [DISCONTINUED] Insulin Glargine (LANTUS) 100 UNIT/ML Solostar Clark Inject 40 Units into the skin daily. (Patient taking differently: Inject 40 Units into the skin 2 (two) times daily. )    No facility-administered encounter medications on file as of 04/28/2019.     Goals Addressed            This Visit's Progress     Patient Stated   . "I want to control my diabetes" (pt-stated)       Current Barriers:  . Diabetes: uncontrolled but improved; most recent A1c 9.3%, due for lab work next  month o He and wife note loving using DexCom CGM.  o Notes significant healing of stump wound, but that lack of physical activity over the past few months has led to some weight gain . Current antihyperglycemic regimen: Lantus 40 units BID, Humalog sliding scale - though generally using 2-10 units only with larger meals . Reports hypoglycemic episodes; occasional episodes of BG <70 if patient skips breakfast and eats a late lunch. Patient notes he doesn't get concerned until BG <50, wife notes that she is concerned if <80 . Current meal patterns: o Notes that he does struggle with eating appropriate portion sizes . Current blood glucose readings:  o Fasting: 110s o 2 hour post prandial: a . 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 Comprehensive medication review performed, medication list updated in electronic medical record . Reviewed risks of hypoglycemia w/ patient, and that goal is to keep BG between 80-180. Recommend incorporating protein snacks to help maintain BG, if he skips breakfast.  . Goal to reduce need for insulin moving forward. Recommend addition of GLP1 for glycemic and cardiovascular benefit. Patient has lab work and f/u with PCP in a few weeks, will defer any changes until that time. Addition of GLP1 will likely allow for removal of bolus insulin need, and reduction in basal dosing. Counseled on GLP1 mechanisms, benefits, side effects . Recommend addition of ezetimibe if LDL is not at goal <70 . Wife  noted that they are interested in implantable glucose sensor. Noted that this would need to be done through endocrinology. Recommended they discuss this with Dr. [name] at upcoming appointment.  Patient Self Care Activities:  . Patient will check blood glucose up to QID w/ Dexcom , document, and provide at future appointment . Patient will take medications as prescribed . Patient will report any questions or concerns to provider   Please see past updates related to this goal by clicking on the "Past Updates" button in the selected goal         Plan: - Scheduled telephonic outreach 06/23/19  Catie Darnelle Maffucci, PharmD, Para [place], [place] Pharmacist [place] [phone]

## 2019-05-17 ENCOUNTER — Other Ambulatory Visit: Payer: Self-pay

## 2019-05-19 DIAGNOSIS — E113513 Type 2 diabetes mellitus with proliferative diabetic retinopathy with macular edema, bilateral: Secondary | ICD-10-CM | POA: Diagnosis not present

## 2019-05-23 ENCOUNTER — Other Ambulatory Visit: Payer: Self-pay | Admitting: Physician Assistant

## 2019-05-24 ENCOUNTER — Other Ambulatory Visit: Payer: Self-pay

## 2019-05-24 ENCOUNTER — Other Ambulatory Visit (INDEPENDENT_AMBULATORY_CARE_PROVIDER_SITE_OTHER): Payer: BC Managed Care – PPO

## 2019-05-24 DIAGNOSIS — E785 Hyperlipidemia, unspecified: Secondary | ICD-10-CM | POA: Diagnosis not present

## 2019-05-24 DIAGNOSIS — E1139 Type 2 diabetes mellitus with other diabetic ophthalmic complication: Secondary | ICD-10-CM | POA: Diagnosis not present

## 2019-05-24 DIAGNOSIS — E1165 Type 2 diabetes mellitus with hyperglycemia: Secondary | ICD-10-CM | POA: Diagnosis not present

## 2019-05-24 DIAGNOSIS — IMO0002 Reserved for concepts with insufficient information to code with codable children: Secondary | ICD-10-CM

## 2019-05-24 LAB — HEPATIC FUNCTION PANEL
ALT: 23 U/L (ref 0–53)
AST: 26 U/L (ref 0–37)
Albumin: 3.1 g/dL — ABNORMAL LOW (ref 3.5–5.2)
Alkaline Phosphatase: 109 U/L (ref 39–117)
Bilirubin, Direct: 0.1 mg/dL (ref 0.0–0.3)
Total Bilirubin: 0.6 mg/dL (ref 0.2–1.2)
Total Protein: 5.5 g/dL — ABNORMAL LOW (ref 6.0–8.3)

## 2019-05-24 LAB — LIPID PANEL
Cholesterol: 126 mg/dL (ref 0–200)
HDL: 31.7 mg/dL — ABNORMAL LOW (ref >39.00)
LDL Cholesterol: 55 mg/dL (ref 0–99)
NonHDL: 94.32
Total CHOL/HDL Ratio: 4
Triglycerides: 199 mg/dL — ABNORMAL HIGH (ref 0.0–149.0)
VLDL: 39.8 mg/dL (ref 0.0–40.0)

## 2019-05-24 LAB — COMPREHENSIVE METABOLIC PANEL
ALT: 23 U/L (ref 0–53)
AST: 26 U/L (ref 0–37)
Albumin: 3.1 g/dL — ABNORMAL LOW (ref 3.5–5.2)
Alkaline Phosphatase: 109 U/L (ref 39–117)
BUN: 40 mg/dL — ABNORMAL HIGH (ref 6–23)
CO2: 24 mEq/L (ref 19–32)
Calcium: 8.7 mg/dL (ref 8.4–10.5)
Chloride: 108 mEq/L (ref 96–112)
Creatinine, Ser: 2.7 mg/dL — ABNORMAL HIGH (ref 0.40–1.50)
GFR: 24.58 mL/min — ABNORMAL LOW (ref >60.00)
Glucose, Bld: 150 mg/dL — ABNORMAL HIGH (ref 70–99)
Potassium: 4.4 mEq/L (ref 3.5–5.1)
Sodium: 138 mEq/L (ref 135–145)
Total Bilirubin: 0.6 mg/dL (ref 0.2–1.2)
Total Protein: 5.5 g/dL — ABNORMAL LOW (ref 6.0–8.3)

## 2019-05-24 LAB — HEMOGLOBIN A1C: Hgb A1c MFr Bld: 5.9 % (ref 4.6–6.5)

## 2019-05-26 ENCOUNTER — Ambulatory Visit: Payer: Self-pay | Admitting: Pharmacist

## 2019-05-26 NOTE — Chronic Care Management (AMB) (Signed)
  Chronic Care Management   Note  05/26/2019 Name: AEON KESSNER MRN: [id number] DOB: 03-16-1966  EMORY GALLENTINE is a 54 y.o. year old male who is a primary care patient of [name], [name], MD. The CCM team was consulted for assistance with chronic disease management and care coordination needs.    Patient's wife called in asking about the difference between Lantus and [place]. They had some [place] at home. Discussed differences in PK.   Catie Darnelle Maffucci, PharmD, [place], CPP Clinical Pharmacist [place] [phone]

## 2019-05-30 ENCOUNTER — Telehealth: Payer: Self-pay | Admitting: Family Medicine

## 2019-05-30 ENCOUNTER — Ambulatory Visit (INDEPENDENT_AMBULATORY_CARE_PROVIDER_SITE_OTHER): Payer: BC Managed Care – PPO | Admitting: Family Medicine

## 2019-05-30 ENCOUNTER — Other Ambulatory Visit: Payer: Self-pay

## 2019-05-30 ENCOUNTER — Encounter: Payer: Self-pay | Admitting: Family Medicine

## 2019-05-30 VITALS — Ht 70.0 in | Wt 215.0 lb

## 2019-05-30 DIAGNOSIS — E785 Hyperlipidemia, unspecified: Secondary | ICD-10-CM

## 2019-05-30 DIAGNOSIS — I739 Peripheral vascular disease, unspecified: Secondary | ICD-10-CM | POA: Diagnosis not present

## 2019-05-30 DIAGNOSIS — E11319 Type 2 diabetes mellitus with unspecified diabetic retinopathy without macular edema: Secondary | ICD-10-CM | POA: Diagnosis not present

## 2019-05-30 DIAGNOSIS — R600 Localized edema: Secondary | ICD-10-CM

## 2019-05-30 DIAGNOSIS — Z794 Long term (current) use of insulin: Secondary | ICD-10-CM

## 2019-05-30 DIAGNOSIS — N184 Chronic kidney disease, stage 4 (severe): Secondary | ICD-10-CM

## 2019-05-30 DIAGNOSIS — I872 Venous insufficiency (chronic) (peripheral): Secondary | ICD-10-CM | POA: Diagnosis not present

## 2019-05-30 DIAGNOSIS — S88112A Complete traumatic amputation at level between knee and ankle, left lower leg, initial encounter: Secondary | ICD-10-CM

## 2019-05-30 NOTE — Telephone Encounter (Signed)
Lvm for pt to schedule 62m follow up with Dr [name].

## 2019-05-30 NOTE — Assessment & Plan Note (Signed)
Bilateral.  Suspect venous insufficiency.  We will get him referred to vascular surgery to consider compression stockings versus other treatment.

## 2019-05-30 NOTE — Telephone Encounter (Signed)
Please let the patient know I heard back from our clinical pharmacist.  I would like to start him on Ozempic.  I can send this to his pharmacy once you speak with him.  He should anticipate not needing any mealtime insulin once he has started the Ozempic.  We will drop his Lantus to 30 units twice daily and we may be able to drop this dose further as the Ozempic is titrated up.

## 2019-05-30 NOTE — Progress Notes (Addendum)
Virtual Visit via video Note  This visit type was conducted due to national recommendations for restrictions regarding the COVID-19 pandemic (e.g. social distancing).  This format is felt to be most appropriate for this patient at this time.  All issues noted in this document were discussed and addressed.  No physical exam was performed (except for noted visual exam findings with Video Visits).   I connected with Chase Clark today at  9:00 AM EST by a video enabled telemedicine application and verified that I am speaking with the correct person using two identifiers. Location patient: home Location provider: work Persons participating in the virtual visit: patient, provider  I discussed the limitations, risks, security and privacy concerns of performing an evaluation and management service by telephone and the availability of in person appointments. I also discussed with the patient that there may be a patient responsible charge related to this service. The patient expressed understanding and agreed to proceed.  Reason for visit: f/u  HPI: DIABETES Disease Monitoring: Blood Sugar ranges-<150, typically, one excursion to 247 with poor diet Polyuria/phagia/dipsia- no  Medications: Compliance- taking lantus 40 u BID, novolog 5-6 u TID with meals, rarely uses novolog 10 u Hypoglycemic symptoms- 1x/week if he forgets to eat  HYPERLIPIDEMIA Symptoms Chest pain on exertion:  no   Medications: Compliance- taking lipitor  CKD: Follows with nephrology.  Sees them in the next 30 days.  Does not take any NSAIDs.  Stump infection: Patient notes this has healed up very well.  He completed a course of Bactrim and had I&D.  He feels as though the prosthesis fits well and this arose out of him wearing it for 18 consecutive hours and developing skin breakdown related to that.  Bilateral leg swelling: This has been a chronic issue.  He has been sitting more.  He notes Lasix is not terribly helpful.   Resolves overnight.  Improves with exercise.  No orthopnea or PND.  No pain.   ROS: See pertinent positives and negatives per HPI.  Past Medical History:  Diagnosis Date  . Arthritis   . Asthma    as child  . Coronary artery disease    DES DIAG1 11/20/09 [facility])  . Diabetes mellitus without complication ([place])   . Diabetic osteomyelitis ([place])   . Diabetic ulcer of foot with muscle involvement without evidence of necrosis ([place])    DIABETIC ULCERATIONS ASSOCIATED WITH IRRITATION LATERAL ANKLE LEFT GREATER THAN RIGHT WITH MILD CELLULITIS   . Diverticulosis   . DKA (diabetic ketoacidoses) ([place]) 08/23/2017  . Edema, lower extremity   . Fatty liver   . GERD (gastroesophageal reflux disease)   . Gilbert's disease   . Glaucoma   . Heart disease 2011   Patient has stent for 80% blockage  . Hyperlipidemia   . Hypertension   . Neuropathy   . Osteomyelitis of left foot ([place])   . Seasonal allergies   . Shoulder pain   . Ulcer of foot ([place]) 08.06.2014   DIABETIC ULCERATIONS ASSOCIATED WITH IRRITATION LATERAL ANKLE LEFT GREATER THAN RIGHT WITH MILD CELLULITIS    Past Surgical History:  Procedure Laterality Date  . ABDOMINAL AORTOGRAM N/A 05/20/2016   Procedure: Abdominal Aortogram possible intervention;  Surgeon: Katha Cabal, MD;  Location: [place] [facility];  Service: Cardiovascular;  Laterality: N/A;  . AMPUTATION Left 09/05/2017   Procedure: AMPUTATION BELOW KNEE;  Surgeon: Tania Ade, MD;  Location: [place];  Service: Orthopedics;  Laterality: Left;  . ANTERIOR CERVICAL DECOMP/DISCECTOMY  FUSION N/A 05/07/2017   Procedure: ANTERIOR CERVICAL DECOMPRESSION/DISCECTOMY Luevenia Maxin PROSTHESIS,PLATE/SCREWS CERVICAL FIVE - CERVICAL SIX;  Surgeon: Newman Pies, MD;  Location: [place];  Service: Neurosurgery;  Laterality: N/A;  . CARDIAC CATHETERIZATION    . CATARACT EXTRACTION W/ INTRAOCULAR LENS IMPLANT    . CATARACT EXTRACTION W/PHACO Left 02/26/2017   Procedure: CATARACT  EXTRACTION PHACO AND INTRAOCULAR LENS PLACEMENT (IOC);  Surgeon: Eulogio Bear, MD;  Location: [facility];  Service: Ophthalmology;  Laterality: Left;  Lot # [id number] H [place]: 00:25.3 AP%: 6.2 CDE: 1.59   . CHOLECYSTECTOMY N/A   . CORONARY ANGIOPLASTY WITH STENT PLACEMENT  2007   1st diagonal  . CORONARY ARTERY BYPASS GRAFT N/A 01/27/2019   Procedure: OFF PUMP CORONARY ARTERY BYPASS GRAFTING (CABG) X 2 WITH ENDOSCOPIC HARVESTING OF RIGHT GREATER SAPHENOUS VEIN. LIMA TO LAD;  Surgeon: Lajuana Matte, MD;  Location: [place];  Service: Open Heart Surgery;  Laterality: N/A;  . CORONARY STENT INTERVENTION N/A 01/21/2019   Procedure: CORONARY STENT INTERVENTION;  Surgeon: Belva Crome, MD;  Location: [place] [facility];  Service: Cardiovascular;  Laterality: N/A;  . EPIBLEPHERON REPAIR WITH TEAR DUCT PROBING    . EYE SURGERY Right sept 29n2015   cataract extraction  . INSERTION EXPRESS TUBE SHUNT Right 09/06/2015   Procedure: INSERTION AHMED TUBE SHUNT with tutoplast allograft;  Surgeon: Eulogio Bear, MD;  Location: [facility];  Service: Ophthalmology;  Laterality: Right;  . LEFT HEART CATH AND CORONARY ANGIOGRAPHY N/A 01/20/2019   Procedure: LEFT HEART CATH AND CORONARY ANGIOGRAPHY;  Surgeon: Lorretta Harp, MD;  Location: [place] [facility];  Service: Cardiovascular;  Laterality: N/A;  . TEE WITHOUT CARDIOVERSION N/A 01/27/2019   Procedure: TRANSESOPHAGEAL ECHOCARDIOGRAM (TEE);  Surgeon: Lajuana Matte, MD;  Location: [place];  Service: Open Heart Surgery;  Laterality: N/A;  . VASECTOMY      Family History  Problem Relation Age of Onset  . Hyperlipidemia Mother   . Diabetes Mother   . Liver disease Mother        NASH  . Hyperlipidemia Father   . Diabetes Father   . Heart disease Father   . Hypertension Father     SOCIAL HX: Former smoker   Current Outpatient Medications:  .  acetaminophen (TYLENOL) 325 MG tablet, Take 1 tablet (325 mg total) by mouth every 6 (six) hours  as needed for mild pain (pain score 1-3 or temp > 100.5)., Disp: 30 tablet, Rfl: 0 .  aspirin (ASPIRIN CHILDRENS) 81 MG chewable tablet, Chew 1 tablet (81 mg total) by mouth daily., Disp: 30 tablet, Rfl: 1 .  atorvastatin (LIPITOR) 80 MG tablet, Take 1 tablet (80 mg total) by mouth daily., Disp: 90 tablet, Rfl: 1 .  brimonidine-timolol (COMBIGAN) 0.2-0.5 % ophthalmic solution, Place 1 drop into the right eye every 12 (twelve) hours. , Disp: , Rfl:  .  cholecalciferol (VITAMIN D3) 25 MCG (1000 UT) tablet, Take 1,000 Units by mouth daily., Disp: , Rfl:  .  Continuous Blood Gluc Receiver (DEXCOM G6 RECEIVER) DEVI, 1 Device by Does not apply route 4 (four) times daily. Use to check blood sugars up to 4 times daily, Disp: 1 each, Rfl: 1 .  Continuous Blood Gluc Sensor (DEXCOM G6 SENSOR) MISC, 1 Device by Does not apply route 4 (four) times daily. Place a new sensor every 10 days to check blood sugar up to 4 times daily, Disp: 9 each, Rfl: 3 .  Continuous Blood Gluc Transmit (DEXCOM G6 TRANSMITTER)  MISC, 1 applicator by Does not apply route 4 (four) times daily., Disp: 1 each, Rfl: 1 .  esomeprazole (NEXIUM) 20 MG capsule, Take 20 mg by mouth daily. , Disp: , Rfl:  .  fexofenadine (ALLEGRA) 180 MG tablet, Take 180 mg by mouth daily., Disp: , Rfl:  .  gabapentin (NEURONTIN) 300 MG capsule, Take 1 capsule (300 mg total) by mouth 2 (two) times daily., Disp: 60 capsule, Rfl: 6 .  insulin aspart (NOVOLOG) 100 UNIT/ML injection, Inject up to 15 units up to 3 times daily with meals, Disp: 42 mL, Rfl: 1 .  Insulin Glargine (LANTUS) 100 UNIT/ML Solostar Pen, Inject 40 Units into the skin 2 (two) times daily., Disp: 72 mL, Rfl: 3 .  Insulin Pen Needle 32G X 4 MM MISC, Inject 5 pens into the skin daily. Use to inject Lantus and Novolog up to 5 times daily, Disp: 500 each, Rfl: 1 .  Lactulose 20 GM/30ML SOLN, Take 30 mLs (20 g total) by mouth as needed., Disp: 450 mL, Rfl: 1 .  latanoprost (XALATAN) 0.005 % ophthalmic  solution, Place 1 drop into both eyes at bedtime., Disp: , Rfl: 5 .  lisinopril (ZESTRIL) 5 MG tablet, Take 5 mg by mouth daily., Disp: , Rfl:  .  metoprolol tartrate (LOPRESSOR) 50 MG tablet, Take 50 mg by mouth 2 (two) times daily., Disp: , Rfl:  .  senna (SENOKOT) 8.6 MG tablet, Take 1 tablet by mouth daily., Disp: , Rfl:  .  sulfamethoxazole-trimethoprim (BACTRIM DS) 800-160 MG tablet, Take 1 tablet by mouth 2 (two) times daily., Disp: 28 tablet, Rfl: 0 .  tamsulosin (FLOMAX) 0.4 MG CAPS capsule, Take 1 capsule (0.4 mg total) by mouth daily., Disp: 30 capsule, Rfl: 1 .  hydrALAZINE (APRESOLINE) 25 MG tablet, Take 1 tablet (25 mg total) by mouth 3 (three) times daily., Disp: 270 tablet, Rfl: 3  EXAM:  VITALS per patient if applicable:  GENERAL: alert, oriented, appears well and in no acute distress  HEENT: atraumatic, conjunttiva clear, no obvious abnormalities on inspection of external nose and ears  NECK: normal movements of the head and neck  LUNGS: on inspection no signs of respiratory distress, breathing rate appears normal, no obvious gross SOB, gasping or wheezing  CV: no obvious cyanosis  MS: moves all visible extremities without noticeable abnormality  PSYCH/NEURO: pleasant and cooperative, no obvious depression or anxiety, speech and thought processing grossly intact  ASSESSMENT AND PLAN:  Discussed the following assessment and plan:  PAD (peripheral artery disease) (HCC) History of this.  We are referring to vascular surgery for his swelling.  Type 2 diabetes, controlled, with retinopathy ([place]) Significantly improved.  We will check with our clinical pharmacist on starting Ozempic or Trulicity.  For now he will continue his current regimen.  He denies personal and family history of thyroid cancer, parathyroid gland cancer, and adrenal gland cancer.  I counseled him on the potential risk for medullary cancer that has been seen in rats.  Discussed that this has not been  seen in humans though given the potential risk in rats we counsel patients that there is a risk and if he notices any swelling or lumps in his throat or trouble swallowing he needs to let us know right away.  Chronic kidney disease (CKD), stage IV (severe) (HCC) He will continue to follow with nephrology.  Discussed avoiding NSAIDs given risk to his kidneys.  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  He  will monitor for recurrence.  He will seek medical attention if this recurs.  He will wear his prosthesis appropriately.  Hyperlipidemia Adequately controlled.  Continue Lipitor.  Edema Bilateral.  Suspect venous insufficiency.  We will get him referred to vascular surgery to consider compression stockings versus other treatment.   Orders Placed This Encounter  Procedures  . Ambulatory referral to Vascular Surgery    Referral Priority:   Routine    Referral Type:   Surgical    Referral Reason:   Specialty Services Required    Requested Specialty:   Vascular Surgery    Number of Visits Requested:   1    No orders of the defined types were placed in this encounter.    I discussed the assessment and treatment plan with the patient. The patient was provided an opportunity to ask questions and all were answered. The patient agreed with the plan and demonstrated an understanding of the instructions.   The patient was advised to call back or seek an in-person evaluation if the symptoms worsen or if the condition fails to improve as anticipated.   [name], MD

## 2019-05-30 NOTE — Assessment & Plan Note (Signed)
Adequately controlled.  Continue Lipitor.

## 2019-05-30 NOTE — Telephone Encounter (Signed)
-----   Message from De Hollingshead, [facility] sent at 05/30/2019  9:46 AM EST ----- Hey!  I think GLP1 is a great idea. I'd go with Ozempic in him; better efficacy, and he can handle the injection.  Since he's using such low doses of mealtime insulin, I'd counsel him to anticipate not needing ANY mealtime once starting GLP, especially as dose is titrated.   Would probably drop basal by about ~1/3-1/4. Maybe have him drop to 30 units, but anticipate being able to reduce as GLP dose is increased?   Do you want him to go ahead and start that, and I can follow up at my next schedule call in March? Catie ----- Message ----- From: [name], MD Sent: 05/30/2019   9:35 AM EST To: De Hollingshead, [facility]  Hey Catie,   Mr Ruark's A1c improved significantly. I think it would be worthwhile starting ozempic or trulicity and trying to get him off some of his insulin. What would be the best way to taper down on his insulin and start one of these medications? Thanks.   Randall Hiss

## 2019-05-30 NOTE — Assessment & Plan Note (Signed)
Stump infection has resolved.  He will monitor for recurrence.  He will seek medical attention if this recurs.  He will wear his prosthesis appropriately.

## 2019-05-30 NOTE — Assessment & Plan Note (Signed)
History of this.  We are referring to vascular surgery for his swelling.

## 2019-05-30 NOTE — Assessment & Plan Note (Signed)
He will continue to follow with nephrology.  Discussed avoiding NSAIDs given risk to his kidneys.

## 2019-05-30 NOTE — Assessment & Plan Note (Addendum)
Significantly improved.  We will check with our clinical pharmacist on starting Ozempic or Trulicity.  For now he will continue his current regimen.  He denies personal and family history of thyroid cancer, parathyroid gland cancer, and adrenal gland cancer.  I counseled him on the potential risk for medullary cancer that has been seen in rats.  Discussed that this has not been seen in humans though given the potential risk in rats we counsel patients that there is a risk and if he notices any swelling or lumps in his throat or trouble swallowing he needs to let us know right away.

## 2019-06-02 ENCOUNTER — Encounter: Payer: Self-pay | Admitting: Pharmacist

## 2019-06-02 ENCOUNTER — Ambulatory Visit: Payer: Self-pay | Admitting: Pharmacist

## 2019-06-02 DIAGNOSIS — E1139 Type 2 diabetes mellitus with other diabetic ophthalmic complication: Secondary | ICD-10-CM

## 2019-06-02 DIAGNOSIS — IMO0002 Reserved for concepts with insufficient information to code with codable children: Secondary | ICD-10-CM

## 2019-06-02 MED ORDER — OZEMPIC (0.25 OR 0.5 MG/DOSE) 2 MG/1.5ML ~~LOC~~ SOPN: 0.5000 mg | PEN_INJECTOR | SUBCUTANEOUS | 2 refills | Status: DC

## 2019-06-02 MED ORDER — INSULIN GLARGINE 100 UNIT/ML SOLOSTAR PEN: 30.0000 [IU] | PEN_INJECTOR | Freq: Two times a day (BID) | SUBCUTANEOUS | 3 refills | Status: DC

## 2019-06-02 MED ORDER — DEXCOM G6 TRANSMITTER MISC: 1.0000 | Freq: Four times a day (QID) | 2 refills | Status: DC

## 2019-06-02 NOTE — Patient Instructions (Signed)
Visit Information  Goals Addressed            This Visit's Progress     Patient Stated   . "I want to control my diabetes" (pt-stated)       Current Barriers:  . Diabetes: controlled, most recent A1c 5.9%, o Terra requests refill on [employer]  . Current antihyperglycemic regimen: Lantus 40 units BID, Humalog sliding scale - though generally using 2-10 units only with larger meals . 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 Refill for transmitter sent. Patient's wife is interested in an endocrinology referral for implantable glucometer sensor. Requests [facility] in [place]. Will collaborate w/ PCP for this referral . Discussed Ozempic as recommended by Dr. [name]. They are in agreement. Start Ozempic 0.25 mg once weekly x4 weeks then increase to 0.5 mg thereafter. Empirically reduce Lantus to 30 units BID and hold Humalog to prevent hypoglycemia, with counseling to reduce Lantus as needed to maintain fasting BG 90-120s  Patient Self Care Activities:  . Patient will check blood glucose up to QID w/ Dexcom , document, and provide at future appointment . Patient will take medications as prescribed . Patient will report any questions or concerns to provider   Please see past updates related to this goal by clicking on the "Past Updates" button in the selected goal         Patient verbalizes understanding of instructions provided today.   Plan:  - Will outreach as previously scheduled  Catie Darnelle Maffucci, PharmD, [place], [place]  Pharmacist [place] [phone]

## 2019-06-02 NOTE — Chronic Care Management (AMB) (Signed)
Chronic Care Management   Follow Up Note   06/02/2019 Name: Chase Clark MRN: [id number] DOB: 05/21/1965  Referred by: Chase Haven, MD Reason for referral : Chronic Care Management (Medication Management)   Chase Clark is a 54 y.o. year old male who is a primary care patient of Chase Clark, Chase Adam, MD. The CCM team was consulted for assistance with chronic disease management and care coordination needs.   Received call from patient's wife, Chase Clark.    Review of patient status, including review of consultants reports, relevant laboratory and other test results, and collaboration with appropriate care team members and the patient's provider was performed as part of comprehensive patient evaluation and provision of chronic care management services.    SDOH (Social Determinants of Health) assessments performed: No    Outpatient Encounter Medications as of 06/02/2019  Medication Sig Note  . acetaminophen (TYLENOL) 325 MG tablet Take 1 tablet (325 mg total) by mouth every 6 (six) hours as needed for mild pain (pain score 1-3 or temp > 100.5).   Marland Kitchen aspirin (ASPIRIN CHILDRENS) 81 MG chewable tablet Chew 1 tablet (81 mg total) by mouth daily.   Marland Kitchen atorvastatin (LIPITOR) 80 MG tablet Take 1 tablet (80 mg total) by mouth daily.   . brimonidine-timolol (COMBIGAN) 0.2-0.5 % ophthalmic solution Place 1 drop into the right eye every 12 (twelve) hours.    . cholecalciferol (VITAMIN D3) 25 MCG (1000 UT) tablet Take 1,000 Units by mouth daily.   . Continuous Blood Gluc Receiver (DEXCOM G6 RECEIVER) DEVI 1 Device by Does not apply route 4 (four) times daily. Use to check blood sugars up to 4 times daily   . Continuous Blood Gluc Sensor (DEXCOM G6 SENSOR) MISC 1 Device by Does not apply route 4 (four) times daily. Place a new sensor every 10 days to check blood sugar up to 4 times daily   . Continuous Blood Gluc Transmit (DEXCOM G6 TRANSMITTER) MISC 1 applicator by Does not apply route 4 (four)  times daily.   Marland Kitchen esomeprazole (NEXIUM) 20 MG capsule Take 20 mg by mouth daily.    . fexofenadine (ALLEGRA) 180 MG tablet Take 180 mg by mouth daily.   Marland Kitchen gabapentin (NEURONTIN) 300 MG capsule Take 1 capsule (300 mg total) by mouth 2 (two) times daily.   . hydrALAZINE (APRESOLINE) 25 MG tablet Take 1 tablet (25 mg total) by mouth 3 (three) times daily.   . insulin aspart (NOVOLOG) 100 UNIT/ML injection Inject up to 15 units up to 3 times daily with meals 04/28/2019: 2-6 units if needed  . Insulin Glargine (LANTUS) 100 UNIT/ML Solostar Clark Inject 30 Units into the skin 2 (two) times daily.   . Insulin Clark Needle 32G X 4 MM MISC Inject 5 pens into the skin daily. Use to inject Lantus and Novolog up to 5 times daily   . Lactulose 20 GM/30ML SOLN Take 30 mLs (20 g total) by mouth as needed.   . latanoprost (XALATAN) 0.005 % ophthalmic solution Place 1 drop into both eyes at bedtime.   Marland Kitchen lisinopril (ZESTRIL) 5 MG tablet Take 5 mg by mouth daily.   . metoprolol tartrate (LOPRESSOR) 50 MG tablet Take 50 mg by mouth 2 (two) times daily.   . Semaglutide,0.25 or 0.5MG /DOS, (OZEMPIC, 0.25 OR 0.5 MG/DOSE,) 2 MG/1.5ML SOPN Inject 0.5 mg into the skin once a week. Inject 0.25 mg once weekly for 4 weeks, then 0.5 mg weekly thereafter   . senna (SENOKOT) 8.6 MG tablet  Take 1 tablet by mouth daily.   Marland Kitchen sulfamethoxazole-trimethoprim (BACTRIM DS) 800-160 MG tablet Take 1 tablet by mouth 2 (two) times daily.   . tamsulosin (FLOMAX) 0.4 MG CAPS capsule Take 1 capsule (0.4 mg total) by mouth daily.   . [DISCONTINUED] Continuous Blood Gluc Transmit (DEXCOM G6 TRANSMITTER) MISC 1 applicator by Does not apply route 4 (four) times daily.   . [DISCONTINUED] Insulin Glargine (LANTUS) 100 UNIT/ML Solostar Clark Inject 40 Units into the skin 2 (two) times daily.    No facility-administered encounter medications on file as of 06/02/2019.     Objective:   Goals Addressed            This Visit's Progress     Patient Stated    . "I want to control my diabetes" (pt-stated)       Current Barriers:  . Diabetes: controlled, most recent A1c 5.9%, o Terra requests refill on [employer]  . Current antihyperglycemic regimen: Lantus 40 units BID, Humalog sliding scale - though generally using 2-10 units only with larger meals . 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 Refill for transmitter sent. Patient's wife is interested in an endocrinology referral for implantable glucometer sensor. Requests [facility] in [place]. Will collaborate w/ PCP for this referral . Discussed Ozempic as recommended by Dr. Caryl Clark. They are in agreement. Start Ozempic 0.25 mg once weekly x4 weeks then increase to 0.5 mg thereafter. Empirically reduce Lantus to 30 units BID and hold Humalog to prevent hypoglycemia, with counseling to reduce Lantus as needed to maintain fasting BG 90-120s  Patient Self Care Activities:  . Patient will check blood glucose up to QID w/ Dexcom , document, and provide at future appointment . Patient will take medications as prescribed . Patient will report any questions or concerns to provider   Please see past updates related to this goal by clicking on the "Past Updates" button in the selected goal          Plan:  - Will outreach as previously scheduled  Catie Darnelle Maffucci, PharmD, [place], [place] Pharmacist [place] [phone]

## 2019-06-03 NOTE — Addendum Note (Signed)
Addended by: [name] on: 06/03/2019 09:20 AM   Modules accepted: Orders

## 2019-06-03 NOTE — Progress Notes (Signed)
I have reviewed the above note and agree. I was available to the pharmacist for consultation. Referral to endo placed.   [name], MD

## 2019-06-07 ENCOUNTER — Encounter (INDEPENDENT_AMBULATORY_CARE_PROVIDER_SITE_OTHER): Payer: Self-pay | Admitting: Vascular Surgery

## 2019-06-07 ENCOUNTER — Other Ambulatory Visit: Payer: Self-pay

## 2019-06-07 ENCOUNTER — Ambulatory Visit (INDEPENDENT_AMBULATORY_CARE_PROVIDER_SITE_OTHER): Payer: BC Managed Care – PPO | Admitting: Vascular Surgery

## 2019-06-07 VITALS — BP 168/92 | HR 63 | Resp 12 | Ht 70.0 in | Wt 232.0 lb

## 2019-06-07 DIAGNOSIS — E785 Hyperlipidemia, unspecified: Secondary | ICD-10-CM

## 2019-06-07 DIAGNOSIS — N184 Chronic kidney disease, stage 4 (severe): Secondary | ICD-10-CM

## 2019-06-07 DIAGNOSIS — I10 Essential (primary) hypertension: Secondary | ICD-10-CM

## 2019-06-07 DIAGNOSIS — I739 Peripheral vascular disease, unspecified: Secondary | ICD-10-CM

## 2019-06-07 DIAGNOSIS — I2510 Atherosclerotic heart disease of native coronary artery without angina pectoris: Secondary | ICD-10-CM | POA: Diagnosis not present

## 2019-06-07 DIAGNOSIS — M7989 Other specified soft tissue disorders: Secondary | ICD-10-CM

## 2019-06-07 DIAGNOSIS — Z794 Long term (current) use of insulin: Secondary | ICD-10-CM

## 2019-06-07 DIAGNOSIS — E11319 Type 2 diabetes mellitus with unspecified diabetic retinopathy without macular edema: Secondary | ICD-10-CM | POA: Diagnosis not present

## 2019-06-07 NOTE — Assessment & Plan Note (Signed)
Continue cardiac and antihypertensive medications as already ordered and reviewed, no changes at this time. Continue statin as ordered and reviewed, no changes at this time Nitrates PRN for chest pain  

## 2019-06-07 NOTE — Assessment & Plan Note (Signed)
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  Does not have any acute limb threatening symptoms, but certainly reassessing his perfusion would be reasonable given that it has been about 3 years since it was last checked.

## 2019-06-07 NOTE — Patient Instructions (Signed)
Peripheral Vascular Disease  Peripheral vascular disease (PVD) is a disease of the blood vessels that are not part of your heart and brain. A simple term for PVD is poor circulation. In most cases, PVD narrows the blood vessels that carry blood from your heart to the rest of your body. This can reduce the supply of blood to your arms, legs, and internal organs, like your stomach or kidneys. However, PVD most often affects a person's lower legs and feet. Without treatment, PVD tends to get worse. PVD can also lead to acute ischemic limb. This is when an arm or leg suddenly cannot get enough blood. This is a medical emergency. Follow these instructions at home: Lifestyle  Do not use any products that contain nicotine or tobacco, such as cigarettes and e-cigarettes. If you need help quitting, ask your doctor.  Lose weight if you are overweight. Or, stay at a healthy weight as told by your doctor.  Eat a diet that is low in fat and cholesterol. If you need help, ask your doctor.  Exercise regularly. Ask your doctor for activities that are right for you. General instructions  Take over-the-counter and prescription medicines only as told by your doctor.  Take good care of your feet: ? Wear comfortable shoes that fit well. ? Check your feet often for any cuts or sores.  Keep all follow-up visits as told by your doctor This is important. Contact a doctor if:  You have cramps in your legs when you walk.  You have leg pain when you are at rest.  You have coldness in a leg or foot.  Your skin changes.  You are unable to get or have an erection (erectile dysfunction).  You have cuts or sores on your feet that do not heal. Get help right away if:  Your arm or leg turns cold, numb, and blue.  Your arms or legs become red, warm, swollen, painful, or numb.  You have chest pain.  You have trouble breathing.  You suddenly have weakness in your face, arm, or leg.  You become very  confused or you cannot speak.  You suddenly have a very bad headache.  You suddenly cannot see. Summary  Peripheral vascular disease (PVD) is a disease of the blood vessels.  A simple term for PVD is poor circulation. Without treatment, PVD tends to get worse.  Treatment may include exercise, low fat and low cholesterol diet, and quitting smoking. This information is not intended to replace advice given to you by your health care provider. Make sure you discuss any questions you have with your health care provider. Document Revised: 03/13/2017 Document Reviewed: 05/08/2016 Elsevier Patient Education  2020 Elsevier Inc.  

## 2019-06-07 NOTE — Assessment & Plan Note (Signed)

## 2019-06-07 NOTE — Progress Notes (Signed)
Patient ID: Chase Clark, male   DOB: 09-12-1965, 54 y.o.   MRN: [id number]  Chief Complaint  Patient presents with  . New Patient (Initial Visit)    PAD Venous Insuf     HPI Chase Clark is a 54 y.o. male.  I am asked to see the patient by Dr. [name] for evaluation of leg swelling and PAD.  He was seen by my partner about 3 years ago and had noninvasive study showing reasonable perfusion.  He ultimately lost his left leg due to a diabetic foot infection and now has a prosthesis.  He does a lot of sitting at the computer because he is legally blind and not as active as he wants was.  He had a coronary bypass about 6 months ago.  His right leg has had more swelling and heaviness as of late.  No open sores or ulceration.  No overt pain other than just the heaviness and tiredness in the leg.  He notices some swelling in the stump as well.  He does have chronic kidney disease as well.  Getting up and moving around will help the swelling but his legs do tire easily.  He does not have pain that wakes him from sleep.     Past Medical History:  Diagnosis Date  . Arthritis   . Asthma    as child  . Coronary artery disease    DES DIAG1 11/20/09 [facility])  . Diabetes mellitus without complication ([place])   . Diabetic osteomyelitis ([place])   . Diabetic ulcer of foot with muscle involvement without evidence of necrosis ([place])    DIABETIC ULCERATIONS ASSOCIATED WITH IRRITATION LATERAL ANKLE LEFT GREATER THAN RIGHT WITH MILD CELLULITIS   . Diverticulosis   . DKA (diabetic ketoacidoses) ([employer]) 08/23/2017  . Edema, lower extremity   . Fatty liver   . GERD (gastroesophageal reflux disease)   . Gilbert's disease   . Glaucoma   . Heart disease 2011   Patient has stent for 80% blockage  . Hyperlipidemia   . Hypertension   . Neuropathy   . Osteomyelitis of left foot ([place])   . Seasonal allergies   . Shoulder pain   . Ulcer of foot ([place]) 08.06.2014   DIABETIC ULCERATIONS ASSOCIATED WITH IRRITATION  LATERAL ANKLE LEFT GREATER THAN RIGHT WITH MILD CELLULITIS    Past Surgical History:  Procedure Laterality Date  . ABDOMINAL AORTOGRAM N/A 05/20/2016   Procedure: Abdominal Aortogram possible intervention;  Surgeon: Katha Cabal, MD;  Location: [place] [facility];  Service: Cardiovascular;  Laterality: N/A;  . AMPUTATION Left 09/05/2017   Procedure: AMPUTATION BELOW KNEE;  Surgeon: Tania Ade, MD;  Location: [place];  Service: Orthopedics;  Laterality: Left;  . ANTERIOR CERVICAL DECOMP/DISCECTOMY FUSION N/A 05/07/2017   Procedure: ANTERIOR CERVICAL DECOMPRESSION/DISCECTOMY Luevenia Maxin PROSTHESIS,PLATE/SCREWS CERVICAL FIVE - CERVICAL SIX;  Surgeon: Newman Pies, MD;  Location: [place];  Service: Neurosurgery;  Laterality: N/A;  . CARDIAC CATHETERIZATION    . CATARACT EXTRACTION W/ INTRAOCULAR LENS IMPLANT    . CATARACT EXTRACTION W/PHACO Left 02/26/2017   Procedure: CATARACT EXTRACTION PHACO AND INTRAOCULAR LENS PLACEMENT (IOC);  Surgeon: Eulogio Bear, MD;  Location: [facility];  Service: Ophthalmology;  Laterality: Left;  Lot # [id number] H [place]: 00:25.3 AP%: 6.2 CDE: 1.59   . CHOLECYSTECTOMY N/A   . CORONARY ANGIOPLASTY WITH STENT PLACEMENT  2007   1st diagonal  . CORONARY ARTERY BYPASS GRAFT N/A 01/27/2019   Procedure: OFF PUMP CORONARY ARTERY BYPASS  GRAFTING (CABG) X 2 WITH ENDOSCOPIC HARVESTING OF RIGHT GREATER SAPHENOUS VEIN. LIMA TO LAD;  Surgeon: Lajuana Matte, MD;  Location: [place];  Service: Open Heart Surgery;  Laterality: N/A;  . CORONARY STENT INTERVENTION N/A 01/21/2019   Procedure: CORONARY STENT INTERVENTION;  Surgeon: Belva Crome, MD;  Location: [place] [facility];  Service: Cardiovascular;  Laterality: N/A;  . EPIBLEPHERON REPAIR WITH TEAR DUCT PROBING    . EYE SURGERY Right sept 29n2015   cataract extraction  . INSERTION EXPRESS TUBE SHUNT Right 09/06/2015   Procedure: INSERTION AHMED TUBE SHUNT with tutoplast allograft;  Surgeon: Eulogio Bear,  MD;  Location: [facility];  Service: Ophthalmology;  Laterality: Right;  . LEFT HEART CATH AND CORONARY ANGIOGRAPHY N/A 01/20/2019   Procedure: LEFT HEART CATH AND CORONARY ANGIOGRAPHY;  Surgeon: Lorretta Harp, MD;  Location: [place] [facility];  Service: Cardiovascular;  Laterality: N/A;  . TEE WITHOUT CARDIOVERSION N/A 01/27/2019   Procedure: TRANSESOPHAGEAL ECHOCARDIOGRAM (TEE);  Surgeon: Lajuana Matte, MD;  Location: [place];  Service: Open Heart Surgery;  Laterality: N/A;  . VASECTOMY       Family History  Problem Relation Age of Onset  . Hyperlipidemia Mother   . Diabetes Mother   . Liver disease Mother        NASH  . Hyperlipidemia Father   . Diabetes Father   . Heart disease Father   . Hypertension Father     Social History   Tobacco Use  . Smoking status: Former Smoker    Types: Cigarettes    Quit date: 03/14/2008    Years since quitting: 11.2  . Smokeless tobacco: Never Used  Substance Use Topics  . Alcohol use: Yes    Comment: OCCAS  . Drug use: No     No Known Allergies  Current Outpatient Medications  Medication Sig Dispense Refill  . acetaminophen (TYLENOL) 325 MG tablet Take 1 tablet (325 mg total) by mouth every 6 (six) hours as needed for mild pain (pain score 1-3 or temp > 100.5). 30 tablet 0  . amLODipine (NORVASC) 5 MG tablet Take 2.5 mg by mouth daily.    Marland Kitchen aspirin (ASPIRIN CHILDRENS) 81 MG chewable tablet Chew 1 tablet (81 mg total) by mouth daily. 30 tablet 1  . atorvastatin (LIPITOR) 80 MG tablet Take 1 tablet (80 mg total) by mouth daily. 90 tablet 1  . brimonidine-timolol (COMBIGAN) 0.2-0.5 % ophthalmic solution Place 1 drop into the right eye every 12 (twelve) hours.     . cholecalciferol (VITAMIN D3) 25 MCG (1000 UT) tablet Take 1,000 Units by mouth daily.    . Continuous Blood Gluc Receiver (DEXCOM G6 RECEIVER) DEVI 1 Device by Does not apply route 4 (four) times daily. Use to check blood sugars up to 4 times daily 1 each 1  .  Continuous Blood Gluc Sensor (DEXCOM G6 SENSOR) MISC 1 Device by Does not apply route 4 (four) times daily. Place a new sensor every 10 days to check blood sugar up to 4 times daily 9 each 3  . Continuous Blood Gluc Transmit (DEXCOM G6 TRANSMITTER) MISC 1 applicator by Does not apply route 4 (four) times daily. 1 each 2  . esomeprazole (NEXIUM) 20 MG capsule Take 20 mg by mouth daily.     . fexofenadine (ALLEGRA) 180 MG tablet Take 180 mg by mouth daily.    . fluticasone (FLONASE) 50 MCG/ACT nasal spray Place 2 sprays into both nostrils daily.    Marland Kitchen  furosemide (LASIX) 40 MG tablet Take 40 mg by mouth daily as needed.    . gabapentin (NEURONTIN) 300 MG capsule Take 1 capsule (300 mg total) by mouth 2 (two) times daily. 60 capsule 6  . insulin aspart (NOVOLOG) 100 UNIT/ML injection Inject up to 15 units up to 3 times daily with meals 42 mL 1  . Insulin Glargine (LANTUS) 100 UNIT/ML Solostar Pen Inject 30 Units into the skin 2 (two) times daily. 72 mL 3  . Insulin Pen Needle 32G X 4 MM MISC Inject 5 pens into the skin daily. Use to inject Lantus and Novolog up to 5 times daily 500 each 1  . Lactulose 20 GM/30ML SOLN Take 30 mLs (20 g total) by mouth as needed. 450 mL 1  . latanoprost (XALATAN) 0.005 % ophthalmic solution Place 1 drop into both eyes at bedtime.  5  . lisinopril (ZESTRIL) 5 MG tablet Take 5 mg by mouth daily.    . metoprolol tartrate (LOPRESSOR) 50 MG tablet Take 50 mg by mouth 2 (two) times daily.    . Semaglutide,0.25 or 0.5MG/DOS, (OZEMPIC, 0.25 OR 0.5 MG/DOSE,) 2 MG/1.5ML SOPN Inject 0.5 mg into the skin once a week. Inject 0.25 mg once weekly for 4 weeks, then 0.5 mg weekly thereafter 1 pen 2  . senna (SENOKOT) 8.6 MG tablet Take 1 tablet by mouth daily.    . tamsulosin (FLOMAX) 0.4 MG CAPS capsule Take 1 capsule (0.4 mg total) by mouth daily. 30 capsule 1  . hydrALAZINE (APRESOLINE) 25 MG tablet Take 1 tablet (25 mg total) by mouth 3 (three) times daily. 270 tablet 3  .  sulfamethoxazole-trimethoprim (BACTRIM DS) 800-160 MG tablet Take 1 tablet by mouth 2 (two) times daily. (Patient not taking: Reported on 06/07/2019) 28 tablet 0   No current facility-administered medications for this visit.      REVIEW OF SYSTEMS (Negative unless checked)  Constitutional: '[]' Weight loss  '[]' Fever  '[]' Chills Cardiac: '[]' Chest pain   '[]' Chest pressure   '[]' Palpitations   '[]' Shortness of breath when laying flat   '[]' Shortness of breath at rest   '[]' Shortness of breath with exertion. Vascular:  '[x]' Pain in legs with walking   '[]' Pain in legs at rest   '[]' Pain in legs when laying flat   '[]' Claudication   '[]' Pain in feet when walking  '[]' Pain in feet at rest  '[]' Pain in feet when laying flat   '[]' History of DVT   '[]' Phlebitis   '[x]' Swelling in legs   '[x]' Varicose veins   '[]' Non-healing ulcers Pulmonary:   '[]' Uses home oxygen   '[]' Productive cough   '[]' Hemoptysis   '[]' Wheeze  '[]' COPD   '[]' Asthma Neurologic:  '[]' Dizziness  '[]' Blackouts   '[]' Seizures   '[]' History of stroke   '[]' History of TIA  '[]' Aphasia   '[]' Temporary blindness   '[]' Dysphagia   '[]' Weakness or numbness in arms   '[]' Weakness or numbness in legs Musculoskeletal:  '[x]' Arthritis   '[]' Joint swelling   '[x]' Joint pain   '[]' Low back pain Hematologic:  '[]' Easy bruising  '[]' Easy bleeding   '[]' Hypercoagulable state   '[]' Anemic  '[]' Hepatitis Gastrointestinal:  '[]' Blood in stool   '[]' Vomiting blood  '[x]' Gastroesophageal reflux/heartburn   '[]' Abdominal pain Genitourinary:  '[]' Chronic kidney disease   '[]' Difficult urination  '[]' Frequent urination  '[]' Burning with urination   '[]' Hematuria Skin:  '[]' Rashes   '[]' Ulcers   '[]' Wounds Psychological:  '[]' History of anxiety   '[]'  History of major depression.    Physical Exam BP (!) 168/92 Comment: No BP med this AM dropped it  Pulse  63   Resp 12   Ht '5\' 10"'  (1.778 m)   Wt 232 lb (105.2 kg)   BMI 33.29 kg/m  Gen:  WD/WN, NAD Head: [place]/AT, No temporalis wasting.  Ear/Nose/Throat: Hearing grossly intact, nares w/o erythema or drainage,  oropharynx w/o Erythema/Exudate Eyes: Patient legally blind Neck: trachea midline.  No JVD.  Pulmonary:  Good air movement, respirations not labored, no use of accessory muscles  Cardiac: RRR, no JVD Vascular:  Vessel Right Left  Radial Palpable Palpable                          PT Trace  NP  DP 1+ NP    Musculoskeletal: M/S 5/5 throughout.  Extremities without ischemic changes.  No deformity or atrophy.  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  1-2+ right lower extremity edema. Neurologic: Sensation grossly intact in extremities.  Symmetrical.  Speech is fluent. Motor exam as listed above. Psychiatric: Judgment intact, Mood & affect appropriate for pt's clinical situation. Dermatologic: No rashes or ulcers noted.  No cellulitis or open wounds.    Radiology No results found.  Labs Recent Results (from the past 2160 hour(s))  CBG monitoring, ED     Status: Abnormal   Collection Time: 03/09/19  1:03 PM  Result Value Ref Range   Glucose-Capillary 148 (H) 70 - 99 mg/dL  CBG monitoring, ED     Status: Abnormal   Collection Time: 03/09/19  3:14 PM  Result Value Ref Range   Glucose-Capillary 168 (H) 70 - 99 mg/dL  Basic metabolic panel     Status: Abnormal   Collection Time: 03/09/19  3:51 PM  Result Value Ref Range   Sodium 135 135 - 145 mmol/L   Potassium 4.0 3.5 - 5.1 mmol/L   Chloride 102 98 - 111 mmol/L   CO2 22 22 - 32 mmol/L   Glucose, Bld 172 (H) 70 - 99 mg/dL   BUN 27 (H) 6 - 20 mg/dL   Creatinine, Ser 2.83 (H) 0.61 - 1.24 mg/dL   Calcium 8.6 (L) 8.9 - 10.3 mg/dL   GFR calc non Af Amer 24 (L) >60 mL/min   GFR calc Af Amer 28 (L) >60 mL/min   Anion gap 11 5 - 15    Comment: Performed at [facility], [address] [place], [place] [address]  CBC with Differential     Status: Abnormal   Collection Time: 03/09/19  3:51 PM  Result Value Ref Range   WBC 7.4 4.0 - 10.5 K/uL   RBC 4.57 4.22 - 5.81 MIL/uL   Hemoglobin 12.5 (L) 13.0 - 17.0 g/dL   HCT 39.4 39.0 -  52.0 %   MCV 86.2 80.0 - 100.0 fL   MCH 27.4 26.0 - 34.0 pg   MCHC 31.7 30.0 - 36.0 g/dL   RDW 13.5 11.5 - 15.5 %   Platelets 160 150 - 400 K/uL    Comment: REPEATED TO VERIFY   nRBC 0.0 0.0 - 0.2 %   Neutrophils Relative % 72 %   Neutro Abs 5.4 1.7 - 7.7 K/uL   Lymphocytes Relative 13 %   Lymphs Abs 1.0 0.7 - 4.0 K/uL   Monocytes Relative 9 %   Monocytes Absolute 0.6 0.1 - 1.0 K/uL   Eosinophils Relative 5 %   Eosinophils Absolute 0.4 0.0 - 0.5 K/uL   Basophils Relative 1 %   Basophils Absolute 0.1 0.0 - 0.1 K/uL   Immature Granulocytes  0 %   Abs Immature Granulocytes 0.02 0.00 - 0.07 K/uL    Comment: Performed at [facility], [place] [address], [place], [place] [address]  Novel Coronavirus, NAA (Hosp order, Send-out to Ref Lab; TAT 18-24 hrs     Status: None   Collection Time: 03/09/19  5:52 PM   Specimen: Nasopharyngeal Swab; Respiratory  Result Value Ref Range   SARS-CoV-2, NAA NOT DETECTED NOT DETECTED    Comment: (NOTE) This nucleic acid amplification test was developed and its performance characteristics determined by [employer]. Nucleic acid amplification tests include PCR and TMA. This test has not been FDA cleared or approved. This test has been authorized by FDA under an Emergency Use Authorization (EUA). This test is only authorized for the duration of time the declaration that circumstances exist justifying the authorization of the emergency use of in vitro diagnostic tests for detection of SARS-CoV-2 virus and/or diagnosis of COVID-19 infection under section 564(b)(1) of the Act, 21 U.S.C. 503TWS-5(K) (1), unless the authorization is terminated or revoked sooner. When diagnostic testing is negative, the possibility of a false negative result should be considered in the context of a patient's recent exposures and the presence of clinical signs and symptoms consistent with COVID-19. An individual without symptoms of COVID- 19 and who is not shedding  SARS-CoV-2 vi rus would expect to have a negative (not detected) result in this assay. Performed At: [facility] [place], [place] [id number] Rush Farmer MD [id number]    Coronavirus Source NASOPHARYNGEAL     Comment: Performed at [facility], [place] [address], [place], [place] [address]  Aerobic/Anaerobic Culture (surgical/deep wound)     Status: None   Collection Time: 03/21/19  1:06 PM   Specimen: Wound  Result Value Ref Range   Specimen Description WOUND LEFT LEG    Special Requests NONE    Gram Stain      FEW WBC PRESENT, PREDOMINANTLY PMN FEW GRAM POSITIVE COCCI    Culture      FEW METHICILLIN RESISTANT STAPHYLOCOCCUS AUREUS NO ANAEROBES ISOLATED Performed at [facility], [place] [address] [place],  [address]    Report Status 03/26/2019 FINAL    Organism ID, Bacteria METHICILLIN RESISTANT STAPHYLOCOCCUS AUREUS       Susceptibility   Methicillin resistant staphylococcus aureus - MIC*    CIPROFLOXACIN <=0.5 SENSITIVE Sensitive     ERYTHROMYCIN >=8 RESISTANT Resistant     GENTAMICIN <=0.5 SENSITIVE Sensitive     OXACILLIN >=4 RESISTANT Resistant     TETRACYCLINE <=1 SENSITIVE Sensitive     VANCOMYCIN <=0.5 SENSITIVE Sensitive     TRIMETH/SULFA <=10 SENSITIVE Sensitive     CLINDAMYCIN <=0.25 SENSITIVE Sensitive     RIFAMPIN <=0.5 SENSITIVE Sensitive     Inducible Clindamycin NEGATIVE Sensitive     * FEW METHICILLIN RESISTANT STAPHYLOCOCCUS AUREUS  Comp Met (CMET)     Status: Abnormal   Collection Time: 05/24/19  9:05 AM  Result Value Ref Range   Sodium 138 135 - 145 mEq/L   Potassium 4.4 3.5 - 5.1 mEq/L   Chloride 108 96 - 112 mEq/L   CO2 24 19 - 32 mEq/L   Glucose, Bld 150 (H) 70 - 99 mg/dL   BUN 40 (H) 6 - 23 mg/dL   Creatinine, Ser 2.70 (H) 0.40 - 1.50 mg/dL   Total Bilirubin 0.6 0.2 - 1.2 mg/dL   Alkaline Phosphatase 109 39 - 117 U/L   AST 26 0 -  37 U/L   ALT 23 0 - 53 U/L   Total Protein 5.5 (L) 6.0 - 8.3 g/dL    Albumin 3.1 (L) 3.5 - 5.2 g/dL   GFR 24.58 (L) >60.00 mL/min   Calcium 8.7 8.4 - 10.5 mg/dL  Lipid panel     Status: Abnormal   Collection Time: 05/24/19  9:05 AM  Result Value Ref Range   Cholesterol 126 0 - 200 mg/dL    Comment: ATP III Classification       Desirable:  < 200 mg/dL               Borderline High:  200 - 239 mg/dL          High:  > = 240 mg/dL   Triglycerides 199.0 (H) 0.0 - 149.0 mg/dL    Comment: Normal:  <150 mg/dLBorderline High:  150 - 199 mg/dL   HDL 31.70 (L) >39.00 mg/dL   VLDL 39.8 0.0 - 40.0 mg/dL   LDL Cholesterol 55 0 - 99 mg/dL   Total CHOL/HDL Ratio 4     Comment:                Men          Women1/2 Average Risk     3.4          3.3Average Risk          5.0          4.42X Average Risk          9.6          7.13X Average Risk          15.0          11.0                       NonHDL 94.32     Comment: NOTE:  Non-HDL goal should be 30 mg/dL higher than patient's LDL goal (i.e. LDL goal of < 70 mg/dL, would have non-HDL goal of < 100 mg/dL)  HgB A1c     Status: None   Collection Time: 05/24/19  9:05 AM  Result Value Ref Range   Hgb A1c MFr Bld 5.9 4.6 - 6.5 %    Comment: Glycemic Control Guidelines for People with Diabetes:Non Diabetic:  <6%Goal of Therapy: <7%Additional Action Suggested:  >8%   Hepatic function panel     Status: Abnormal   Collection Time: 05/24/19  9:05 AM  Result Value Ref Range   Total Bilirubin 0.6 0.2 - 1.2 mg/dL   Bilirubin, Direct 0.1 0.0 - 0.3 mg/dL   Alkaline Phosphatase 109 39 - 117 U/L   AST 26 0 - 37 U/L   ALT 23 0 - 53 U/L   Total Protein 5.5 (L) 6.0 - 8.3 g/dL   Albumin 3.1 (L) 3.5 - 5.2 g/dL    Assessment/Plan:  Essential hypertension blood pressure control important in reducing the progression of atherosclerotic disease. On appropriate oral medications.   Coronary artery disease involving native coronary artery of native heart without angina pectoris Continue cardiac and antihypertensive medications as already  ordered and reviewed, no changes at this time. Continue statin as ordered and reviewed, no changes at this time Nitrates PRN for chest pain   Type 2 diabetes, controlled, with retinopathy ([place]) blood glucose control important in reducing the progression of atherosclerotic disease. Also, involved in wound healing. On appropriate medications.   Chronic kidney disease (CKD), stage IV (severe) ([place]) Start  with non-invasive studies and limit contrast if any procedures are needed.  Could also be an underlying factor in some of his leg swelling  Hyperlipidemia lipid control important in reducing the progression of atherosclerotic disease. Continue statin therapy   PAD (peripheral artery disease) ([place]) Has already lost his left leg.  Does not have any acute limb threatening symptoms, but certainly reassessing his perfusion would be reasonable given that it has been about 3 years since it was last checked.  Swelling of limb I have had a long discussion with the patient regarding swelling and why it  causes symptoms.  Patient will begin wearing graduated compression stockings class 1 (20-30 mmHg) on a daily basis a prescription was given. The patient will  beginning wearing the stockings first thing in the morning and removing them in the evening. The patient is instructed specifically not to sleep in the stockings.  In addition, behavioral modification will be initiated.  This will include frequent elevation, use of over the counter pain medications and exercise such as walking. I have reviewed systemic causes for chronic edema such as liver, kidney and cardiac etiologies.  The patient denies problems with these organ systems.   Consideration for a lymph pump will also be made based upon the effectiveness of conservative therapy.  This would help to improve the edema control and prevent sequela such as ulcers and infections  Patient should undergo duplex ultrasound of the venous system to ensure that  DVT or reflux is not present. The patient will follow-up with me after the ultrasound.        Leotis Pain 06/07/2019, 9:56 AM   This note was created with Dragon medical transcription system.  Any errors from dictation are unintentional.

## 2019-06-07 NOTE — Assessment & Plan Note (Signed)
blood pressure control important in reducing the progression of atherosclerotic disease. On appropriate oral medications.  

## 2019-06-07 NOTE — Assessment & Plan Note (Signed)
blood glucose control important in reducing the progression of atherosclerotic disease. Also, involved in wound healing. On appropriate medications.  

## 2019-06-07 NOTE — Assessment & Plan Note (Addendum)
Start with non-invasive studies and limit contrast if any procedures are needed.  Could also be an underlying factor in some of his leg swelling

## 2019-06-07 NOTE — Assessment & Plan Note (Signed)
lipid control important in reducing the progression of atherosclerotic disease. Continue statin therapy  

## 2019-06-14 DIAGNOSIS — N184 Chronic kidney disease, stage 4 (severe): Secondary | ICD-10-CM | POA: Diagnosis not present

## 2019-06-14 DIAGNOSIS — I129 Hypertensive chronic kidney disease with stage 1 through stage 4 chronic kidney disease, or unspecified chronic kidney disease: Secondary | ICD-10-CM | POA: Diagnosis not present

## 2019-06-14 DIAGNOSIS — D631 Anemia in chronic kidney disease: Secondary | ICD-10-CM | POA: Diagnosis not present

## 2019-06-14 DIAGNOSIS — R809 Proteinuria, unspecified: Secondary | ICD-10-CM | POA: Diagnosis not present

## 2019-06-15 ENCOUNTER — Ambulatory Visit: Payer: Self-pay | Admitting: Pharmacist

## 2019-06-15 DIAGNOSIS — E11319 Type 2 diabetes mellitus with unspecified diabetic retinopathy without macular edema: Secondary | ICD-10-CM

## 2019-06-15 DIAGNOSIS — Z794 Long term (current) use of insulin: Secondary | ICD-10-CM

## 2019-06-15 NOTE — Chronic Care Management (AMB) (Signed)
Chronic Care Management   Follow Up Note    06/15/2019 Name: Chase Clark MRN: [id number] DOB: [birth year]  Referred by: Chase Haven, MD Reason for referral : Chronic Care Management (Medication Management)   Chase Clark is a 54 y.o. year old male who is a primary care patient of [name], [name], MD. The CCM team was consulted for assistance with chronic disease management and care coordination needs.    Care coordination completed today.   Review of patient status, including review of consultants reports, relevant laboratory and other test results, and collaboration with appropriate care team members and the patient's provider was performed as part of comprehensive patient evaluation and provision of chronic care management services.    SDOH (Social Determinants of Health) assessments performed: No See Care Plan activities for detailed interventions [facility] to [facility])     Outpatient Encounter Medications as of 06/15/2019  Medication Sig Note   acetaminophen (TYLENOL) 325 MG tablet Take 1 tablet (325 mg total) by mouth every 6 (six) hours as needed for mild pain (pain score 1-3 or temp > 100.5).    amLODipine (NORVASC) 5 MG tablet Take 2.5 mg by mouth daily.    aspirin (ASPIRIN CHILDRENS) 81 MG chewable tablet Chew 1 tablet (81 mg total) by mouth daily.    atorvastatin (LIPITOR) 80 MG tablet Take 1 tablet (80 mg total) by mouth daily.    brimonidine-timolol (COMBIGAN) 0.2-0.5 % ophthalmic solution Place 1 drop into the right eye every 12 (twelve) hours.     cholecalciferol (VITAMIN D3) 25 MCG (1000 UT) tablet Take 1,000 Units by mouth daily.    Continuous Blood Gluc Receiver (DEXCOM G6 RECEIVER) DEVI 1 Device by Does not apply route 4 (four) times daily. Use to check blood sugars up to 4 times daily    Continuous Blood Gluc Sensor (DEXCOM G6 SENSOR) MISC 1 Device by Does not apply route 4 (four) times daily. Place a new sensor every 10 days to check blood sugar up to  4 times daily    Continuous Blood Gluc Transmit (DEXCOM G6 TRANSMITTER) MISC 1 applicator by Does not apply route 4 (four) times daily.    esomeprazole (NEXIUM) 20 MG capsule Take 20 mg by mouth daily.     fexofenadine (ALLEGRA) 180 MG tablet Take 180 mg by mouth daily.    fluticasone (FLONASE) 50 MCG/ACT nasal spray Place 2 sprays into both nostrils daily.    furosemide (LASIX) 40 MG tablet Take 40 mg by mouth daily as needed.    gabapentin (NEURONTIN) 300 MG capsule Take 1 capsule (300 mg total) by mouth 2 (two) times daily.    hydrALAZINE (APRESOLINE) 25 MG tablet Take 1 tablet (25 mg total) by mouth 3 (three) times daily.    insulin aspart (NOVOLOG) 100 UNIT/ML injection Inject up to 15 units up to 3 times daily with meals 04/28/2019: 2-6 units if needed   Insulin Glargine (LANTUS) 100 UNIT/ML Solostar Pen Inject 30 Units into the skin 2 (two) times daily.    Insulin Pen Needle 32G X 4 MM MISC Inject 5 pens into the skin daily. Use to inject Lantus and Novolog up to 5 times daily    Lactulose 20 GM/30ML SOLN Take 30 mLs (20 g total) by mouth as needed.    latanoprost (XALATAN) 0.005 % ophthalmic solution Place 1 drop into both eyes at bedtime.    lisinopril (ZESTRIL) 5 MG tablet Take 5 mg by mouth daily.    metoprolol tartrate (LOPRESSOR)  50 MG tablet Take 50 mg by mouth 2 (two) times daily.    Semaglutide,0.25 or 0.5MG /DOS, (OZEMPIC, 0.25 OR 0.5 MG/DOSE,) 2 MG/1.5ML SOPN Inject 0.5 mg into the skin once a week. Inject 0.25 mg once weekly for 4 weeks, then 0.5 mg weekly thereafter    senna (SENOKOT) 8.6 MG tablet Take 1 tablet by mouth daily.    sulfamethoxazole-trimethoprim (BACTRIM DS) 800-160 MG tablet Take 1 tablet by mouth 2 (two) times daily. (Patient not taking: Reported on 06/07/2019)    tamsulosin (FLOMAX) 0.4 MG CAPS capsule Take 1 capsule (0.4 mg total) by mouth daily.    No facility-administered encounter medications on file as of 06/15/2019.     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) o Current hypertensive regimen: lisinopril 5 mg daily, metoprolol tartrate 50 mg BID, hydralazine 25 mg TID o Current hyperlipidemia regimen: atorvastatin 80 mg daily; LDL at goal <70 o Antiplatelet regimen: ASA 81 mg daily  Pharmacist Clinical Goal(s):   Over the next 90 days, patient will work with PharmD and primary care provider to address optimized glycemic benefit  Interventions:  [facility]ed [facility] to discuss prescription I sent in November, as I thought I had sent correctly for 90 day supply w/ 3 refills, but I am unsure how it transmits to them based on how the order is built in [employer]. They confirmed that there are refills on the [facility] sensor prescription and they will fill today.   Patient Self Care Activities:   Patient will check blood glucose up to QID w/ Dexcom , document, and provide at future appointment  Patient will take medications as prescribed  Patient will report any questions or concerns to provider   Please see past updates related to this goal by clicking on the "Past Updates" button in the selected goal          Plan:  - Will outreach patient as previously scheduled  Catie Darnelle Maffucci, PharmD, [place], [place] Pharmacist [name] [phone]

## 2019-06-15 NOTE — Patient Instructions (Signed)
Visit Information  Goals Addressed            This Visit's Progress     Patient Stated   . "I want to control my diabetes" (pt-stated)       CARE PLAN ENTRY (see longtitudinal plan of care for additional care plan information)  Current Barriers:  . Diabetes: controlled, most recent A1c 5.9%, o [facility] requests refill on DexCom G6 sensors . Current antihyperglycemic regimen: Lantus 30 units BID, Ozempic 0.25 mg weekly, Novolog PRN  . 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 [facility] to discuss prescription I sent in November, as I thought I had sent correctly for 90 day supply w/ 3 refills, but I am unsure how it transmits to them based on how the order is built in [employer]. They confirmed that there are refills on the [facility] sensor prescription and they will fill today.   Patient Self Care Activities:  . Patient will check blood glucose up to QID w/ Dexcom , document, and provide at future appointment . Patient will take medications as prescribed . Patient will report any questions or concerns to provider   Please see past updates related to this goal by clicking on the "Past Updates" button in the selected goal         Patient verbalizes understanding of instructions provided today.   Plan:  - Will outreach patient as previously scheduled  Catie Darnelle Maffucci, PharmD, [place], [place] Pharmacist [place] [phone]

## 2019-06-16 ENCOUNTER — Ambulatory Visit: Payer: Self-pay | Admitting: Pharmacist

## 2019-06-16 DIAGNOSIS — Z794 Long term (current) use of insulin: Secondary | ICD-10-CM

## 2019-06-16 DIAGNOSIS — E11319 Type 2 diabetes mellitus with unspecified diabetic retinopathy without macular edema: Secondary | ICD-10-CM

## 2019-06-16 NOTE — Chronic Care Management (AMB) (Signed)
Chronic Care Management   Follow Up Note   06/16/2019 Name: Chase Clark MRN: [id number] DOB: [birth year]  Referred by: [name], MD Reason for referral : Chronic Care Management (Medication Management)   Chase Clark is a 55 y.o. year old male who is a primary care patient of [name], [name], MD. The CCM team was consulted for assistance with chronic disease management and care coordination needs.    Care coordination.   Review of patient status, including review of consultants reports, relevant laboratory and other test results, and collaboration with appropriate care team members and the patient's provider was performed as part of comprehensive patient evaluation and provision of chronic care management services.    SDOH (Social Determinants of Health) assessments performed: No See Care Plan activities for detailed interventions related to [facility])     Outpatient Encounter Medications as of 06/16/2019  Medication Sig Note  . acetaminophen (TYLENOL) 325 MG tablet Take 1 tablet (325 mg total) by mouth every 6 (six) hours as needed for mild pain (pain score 1-3 or temp > 100.5).   Marland Kitchen amLODipine (NORVASC) 5 MG tablet Take 2.5 mg by mouth daily.   Marland Kitchen aspirin (ASPIRIN CHILDRENS) 81 MG chewable tablet Chew 1 tablet (81 mg total) by mouth daily.   Marland Kitchen atorvastatin (LIPITOR) 80 MG tablet Take 1 tablet (80 mg total) by mouth daily.   . brimonidine-timolol (COMBIGAN) 0.2-0.5 % ophthalmic solution Place 1 drop into the right eye every 12 (twelve) hours.    . cholecalciferol (VITAMIN D3) 25 MCG (1000 UT) tablet Take 1,000 Units by mouth daily.   . Continuous Blood Gluc Receiver (DEXCOM G6 RECEIVER) DEVI 1 Device by Does not apply route 4 (four) times daily. Use to check blood sugars up to 4 times daily   . Continuous Blood Gluc Sensor (DEXCOM G6 SENSOR) MISC 1 Device by Does not apply route 4 (four) times daily. Place a new sensor every 10 days to check blood sugar up to 4 times daily     . Continuous Blood Gluc Transmit (DEXCOM G6 TRANSMITTER) MISC 1 applicator by Does not apply route 4 (four) times daily.   Marland Kitchen esomeprazole (NEXIUM) 20 MG capsule Take 20 mg by mouth daily.    . fexofenadine (ALLEGRA) 180 MG tablet Take 180 mg by mouth daily.   . fluticasone (FLONASE) 50 MCG/ACT nasal spray Place 2 sprays into both nostrils daily.   . furosemide (LASIX) 40 MG tablet Take 40 mg by mouth daily as needed.   . gabapentin (NEURONTIN) 300 MG capsule Take 1 capsule (300 mg total) by mouth 2 (two) times daily.   . hydrALAZINE (APRESOLINE) 25 MG tablet Take 1 tablet (25 mg total) by mouth 3 (three) times daily.   . insulin aspart (NOVOLOG) 100 UNIT/ML injection Inject up to 15 units up to 3 times daily with meals 04/28/2019: 2-6 units if needed  . Insulin Glargine (LANTUS) 100 UNIT/ML Solostar Pen Inject 30 Units into the skin 2 (two) times daily.   . Insulin Pen Needle 32G X 4 MM MISC Inject 5 pens into the skin daily. Use to inject Lantus and Novolog up to 5 times daily   . Lactulose 20 GM/30ML SOLN Take 30 mLs (20 g total) by mouth as needed.   . latanoprost (XALATAN) 0.005 % ophthalmic solution Place 1 drop into both eyes at bedtime.   Marland Kitchen lisinopril (ZESTRIL) 5 MG tablet Take 5 mg by mouth daily.   . metoprolol tartrate (LOPRESSOR) 50 MG  tablet Take 50 mg by mouth 2 (two) times daily.   . Semaglutide,0.25 or 0.5MG /DOS, (OZEMPIC, 0.25 OR 0.5 MG/DOSE,) 2 MG/1.5ML SOPN Inject 0.5 mg into the skin once a week. Inject 0.25 mg once weekly for 4 weeks, then 0.5 mg weekly thereafter   . senna (SENOKOT) 8.6 MG tablet Take 1 tablet by mouth daily.   Marland Kitchen sulfamethoxazole-trimethoprim (BACTRIM DS) 800-160 MG tablet Take 1 tablet by mouth 2 (two) times daily. (Patient not taking: Reported on 06/07/2019)   . tamsulosin (FLOMAX) 0.4 MG CAPS capsule Take 1 capsule (0.4 mg total) by mouth daily.    No facility-administered encounter medications on file as of 06/16/2019.     Objective:   Goals Addressed             This Visit's Progress     Patient Stated   . "I want to control my diabetes" (pt-stated)       CARE PLAN ENTRY (see longtitudinal plan of care for additional care plan information)  Current Barriers:  . Diabetes: controlled, most recent A1c 5.9%, o [facility] called to note that DexCom sensors were >$150 at the pharmacy, even though previously free and transmitter free earlier this year.  . Current antihyperglycemic regimen: Lantus 30 units BID, Ozempic 0.25 mg weekly, Novolog PRN  . 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 [facility]. They could not provide any explanation as to the cost. [place] back, left message recommending she contact [employer] to discuss change in coverage.   Patient Self Care Activities:  . Patient will check blood glucose up to QID w/ Dexcom , document, and provide at future appointment . Patient will take medications as prescribed . Patient will report any questions or concerns to provider   Please see past updates related to this goal by clicking on the "Past Updates" button in the selected goal          Plan:  - Will outreach patient as previously scheduled  Catie Darnelle Maffucci, PharmD, [place], [place] Pharmacist [place] [phone]

## 2019-06-16 NOTE — Patient Instructions (Signed)
Visit Information  Goals Addressed            This Visit's Progress     Patient Stated   . "I want to control my diabetes" (pt-stated)       CARE PLAN ENTRY (see longtitudinal plan of care for additional care plan information)  Current Barriers:  . Diabetes: controlled, most recent A1c 5.9%, o [facility] called to note that DexCom sensors were >$150 at the pharmacy, even though previously free and transmitter free earlier this year.  . Current antihyperglycemic regimen: Lantus 30 units BID, Ozempic 0.25 mg weekly, Novolog PRN  . 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 [facility]'s [facility]. They could not provide any explanation as to the cost. [place] back, left message recommending she contact [employer] to discuss change in coverage.   Patient Self Care Activities:  . Patient will check blood glucose up to QID w/ Dexcom , document, and provide at future appointment . Patient will take medications as prescribed . Patient will report any questions or concerns to provider   Please see past updates related to this goal by clicking on the "Past Updates" button in the selected goal         Patient verbalizes understanding of instructions provided today.   Plan:  - Will outreach patient as previously scheduled  Catie Darnelle Maffucci, PharmD, [place], [place] Pharmacist [place] [phone]

## 2019-06-22 DIAGNOSIS — E113513 Type 2 diabetes mellitus with proliferative diabetic retinopathy with macular edema, bilateral: Secondary | ICD-10-CM | POA: Diagnosis not present

## 2019-06-23 ENCOUNTER — Ambulatory Visit: Payer: BC Managed Care – PPO | Admitting: Pharmacist

## 2019-06-23 ENCOUNTER — Encounter: Payer: Self-pay | Admitting: Pharmacist

## 2019-06-23 DIAGNOSIS — Z794 Long term (current) use of insulin: Secondary | ICD-10-CM

## 2019-06-23 DIAGNOSIS — N184 Chronic kidney disease, stage 4 (severe): Secondary | ICD-10-CM

## 2019-06-23 DIAGNOSIS — E11319 Type 2 diabetes mellitus with unspecified diabetic retinopathy without macular edema: Secondary | ICD-10-CM

## 2019-06-23 NOTE — Patient Instructions (Signed)
Visit Information  Goals Addressed            This Visit's Progress     Patient Stated   . "I want to control my diabetes" (pt-stated)       CARE PLAN ENTRY (see longtitudinal plan of care for additional care plan information)  Current Barriers:  . Diabetes: controlled, most recent A1c 5.9%, o Tried 2 doses of Ozempic, significant GI upset as well as vision changes. Notes that it seemed to trigger occular migraines. He notes he met with the eye doctor yesterday who concurred that it could have contributed to the occular migraines  o Wife, [name], previously asked if anything could be done to help w/ the cost of the DexCom G6 sensors. o Establishing w/ endocrinology Dr. Kelton Pillar next week. Wife, [name], previously expressed interest in implanted glucose sensors.  . Most recent eGFR ~25 mL/min . Current antihyperglycemic regimen: Lantus 30 units BID, Novolog PRN - has not needed recently d/t not eating (GI upset from [place]) o Previously tried: Ozempic; significant GI upset . Current glucose readings: unable to review today . 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 His wife, [name], manages medications.  o Current hyperlipidemia regimen: atorvastatin 80 mg daily; LDL at goal <70 o Antiplatelet regimen: ASA 81 mg daily  Pharmacist Clinical Goal(s):  Marland Kitchen Over the next 90 days, patient will work with PharmD and primary care provider to address optimized glycemic benefit  Interventions: . Reviewed medications in Epic per patient memory.  . Agreed w/ d/c Ozempic d/t significant GI upset and occular migraines, even with very low dose. Added to patient intolerance list.  . Unfortunately, SGLT2 not an option for ASCVD risk reduction d/t  current renal function . For now, continue Lantus 30 units BID. Novolog for PRN use. Will establish with endocrinology next week for more specific dosing recommendations.  . Sending url for DexCom coupon offer program via [place]. Encouraged patient to let his wife know to contact me with any questions or concerns.  . Reviewed upcoming appointments, including endo, cardiology, and vascular.   Patient Self Care Activities:  . Patient will check blood glucose up to QID w/ Dexcom , document, and provide at future appointment . Patient will take medications as prescribed . Patient will report any questions or concerns to provider   Please see past updates related to this goal by clicking on the "Past Updates" button in the selected goal         Patient verbalizes understanding of instructions provided today.   Plan:  - Scheduled f/u call 08/01/19  Catie Darnelle Maffucci, PharmD, BCACP, CPP Clinical Pharmacist [place] [phone]

## 2019-06-23 NOTE — Chronic Care Management (AMB) (Signed)
Chronic Care Management   Follow Up Note   06/23/2019 Name: GOVERNOR MATOS MRN: [id number] DOB: 27-Jun-1965  Referred by: [name], MD Reason for referral : Chronic Care Management (Medication Management)   MILTON SAGONA is a 54 y.o. year old male who is a primary care patient of [name], [name], MD. The CCM team was consulted for assistance with chronic disease management and care coordination needs.    Contacted patient for medication management review today. His wife and primary medical caregiver, [name], was not available.   Review of patient status, including review of consultants reports, relevant laboratory and other test results, and collaboration with appropriate care team members and the patient's provider was performed as part of comprehensive patient evaluation and provision of chronic care management services.    SDOH (Social Determinants of Health) assessments performed: No See Care Plan activities for detailed interventions related to [facility])     Outpatient Encounter Medications as of 06/23/2019  Medication Sig  . acetaminophen (TYLENOL) 325 MG tablet Take 1 tablet (325 mg total) by mouth every 6 (six) hours as needed for mild pain (pain score 1-3 or temp > 100.5).  Marland Kitchen amLODipine (NORVASC) 5 MG tablet Take 2.5 mg by mouth daily.  Marland Kitchen aspirin (ASPIRIN CHILDRENS) 81 MG chewable tablet Chew 1 tablet (81 mg total) by mouth daily.  Marland Kitchen atorvastatin (LIPITOR) 80 MG tablet Take 1 tablet (80 mg total) by mouth daily.  . cholecalciferol (VITAMIN D3) 25 MCG (1000 UT) tablet Take 1,000 Units by mouth daily.  . Continuous Blood Gluc Receiver (DEXCOM G6 RECEIVER) DEVI 1 Device by Does not apply route 4 (four) times daily. Use to check blood sugars up to 4 times daily  . Continuous Blood Gluc Sensor (DEXCOM G6 SENSOR) MISC 1 Device by Does not apply route 4 (four) times daily. Place a new sensor every 10 days to check blood sugar up to 4 times daily  . Continuous Blood Gluc  Transmit (DEXCOM G6 TRANSMITTER) MISC 1 applicator by Does not apply route 4 (four) times daily.  Marland Kitchen esomeprazole (NEXIUM) 20 MG capsule Take 20 mg by mouth daily.   . fluticasone (FLONASE) 50 MCG/ACT nasal spray Place 2 sprays into both nostrils daily.  . furosemide (LASIX) 40 MG tablet Take 40 mg by mouth daily as needed.  . gabapentin (NEURONTIN) 300 MG capsule Take 1 capsule (300 mg total) by mouth 2 (two) times daily.  . hydrALAZINE (APRESOLINE) 25 MG tablet Take 1 tablet (25 mg total) by mouth 3 (three) times daily.  . Insulin Glargine (LANTUS) 100 UNIT/ML Solostar Pen Inject 30 Units into the skin 2 (two) times daily.  . Insulin Pen Needle 32G X 4 MM MISC Inject 5 pens into the skin daily. Use to inject Lantus and Novolog up to 5 times daily  . Lactulose 20 GM/30ML SOLN Take 30 mLs (20 g total) by mouth as needed.  Marland Kitchen lisinopril (ZESTRIL) 5 MG tablet Take 5 mg by mouth daily.  . metoprolol tartrate (LOPRESSOR) 50 MG tablet Take 50 mg by mouth 2 (two) times daily.  Marland Kitchen senna (SENOKOT) 8.6 MG tablet Take 1 tablet by mouth daily.  . tamsulosin (FLOMAX) 0.4 MG CAPS capsule Take 1 capsule (0.4 mg total) by mouth daily.  . brimonidine-timolol (COMBIGAN) 0.2-0.5 % ophthalmic solution Place 1 drop into the right eye every 12 (twelve) hours.   . fexofenadine (ALLEGRA) 180 MG tablet Take 180 mg by mouth daily.  . insulin aspart (NOVOLOG) 100 UNIT/ML injection  Inject up to 15 units up to 3 times daily with meals (Patient not taking: Reported on 06/23/2019)  . latanoprost (XALATAN) 0.005 % ophthalmic solution Place 1 drop into both eyes at bedtime.  . [DISCONTINUED] Semaglutide,0.25 or 0.5MG/DOS, (OZEMPIC, 0.25 OR 0.5 MG/DOSE,) 2 MG/1.5ML SOPN Inject 0.5 mg into the skin once a week. Inject 0.25 mg once weekly for 4 weeks, then 0.5 mg weekly thereafter  . [DISCONTINUED] sulfamethoxazole-trimethoprim (BACTRIM DS) 800-160 MG tablet Take 1 tablet by mouth 2 (two) times daily. (Patient not taking: Reported on  06/07/2019)   No facility-administered encounter medications on file as of 06/23/2019.     Objective:   Goals Addressed            This Visit's Progress     Patient Stated   . "I want to control my diabetes" (pt-stated)       CARE PLAN ENTRY (see longtitudinal plan of care for additional care plan information)  Current Barriers:  . Diabetes: controlled, most recent A1c 5.9%, o Tried 2 doses of Ozempic, significant GI upset as well as vision changes. Notes that it seemed to trigger occular migraines. He notes he met with the eye doctor yesterday who concurred that it could have contributed to the occular migraines  o Wife, [name], previously asked if anything could be done to help w/ the cost of the DexCom G6 sensors. o Establishing w/ endocrinology Dr. Kelton Pillar next week. Wife, [name], previously expressed interest in implanted glucose sensors.  . Most recent eGFR ~25 mL/min . Current antihyperglycemic regimen: Lantus 30 units BID, Novolog PRN - has not needed recently d/t not eating (GI upset from [place]) o Previously tried: Ozempic; significant GI upset . Current glucose readings: unable to review today . 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 His wife, [name], manages medications.  o Current hyperlipidemia regimen: atorvastatin 80 mg daily; LDL at goal <70 o Antiplatelet regimen: ASA 81 mg daily  Pharmacist Clinical Goal(s):  Marland Kitchen Over the next 90 days, patient will work with PharmD and primary care provider to address optimized glycemic benefit  Interventions: . Reviewed medications in Epic per patient memory.  . Agreed w/ d/c Ozempic d/t significant GI upset and occular migraines, even with very low dose. Added to patient  intolerance list.  . Unfortunately, SGLT2 not an option for ASCVD risk reduction d/t current renal function . For now, continue Lantus 30 units BID. Novolog for PRN use. Will establish with endocrinology next week for more specific dosing recommendations.  . Sending url for DexCom coupon offer program via [employer]. Encouraged patient to let his wife know to contact me with any questions or concerns.  . Reviewed upcoming appointments, including endo, cardiology, and vascular.   Patient Self Care Activities:  . Patient will check blood glucose up to QID w/ Dexcom , document, and provide at future appointment . Patient will take medications as prescribed . Patient will report any questions or concerns to provider   Please see past updates related to this goal by clicking on the "Past Updates" button in the selected goal          Plan:  - Scheduled f/u call 08/01/19  Catie Darnelle Maffucci, PharmD, BCACP, [place] Pharmacist [place] [phone]

## 2019-06-28 ENCOUNTER — Other Ambulatory Visit: Payer: Self-pay

## 2019-06-29 ENCOUNTER — Ambulatory Visit: Payer: BC Managed Care – PPO | Admitting: Internal Medicine

## 2019-06-29 ENCOUNTER — Encounter: Payer: Self-pay | Admitting: Internal Medicine

## 2019-06-29 VITALS — BP 158/72 | HR 74 | Temp 98.0°F | Ht 70.0 in | Wt 229.0 lb

## 2019-06-29 DIAGNOSIS — E785 Hyperlipidemia, unspecified: Secondary | ICD-10-CM

## 2019-06-29 DIAGNOSIS — E1042 Type 1 diabetes mellitus with diabetic polyneuropathy: Secondary | ICD-10-CM | POA: Diagnosis not present

## 2019-06-29 DIAGNOSIS — Z89512 Acquired absence of left leg below knee: Secondary | ICD-10-CM

## 2019-06-29 DIAGNOSIS — E1022 Type 1 diabetes mellitus with diabetic chronic kidney disease: Secondary | ICD-10-CM | POA: Diagnosis not present

## 2019-06-29 DIAGNOSIS — N184 Chronic kidney disease, stage 4 (severe): Secondary | ICD-10-CM

## 2019-06-29 DIAGNOSIS — E103593 Type 1 diabetes mellitus with proliferative diabetic retinopathy without macular edema, bilateral: Secondary | ICD-10-CM

## 2019-06-29 DIAGNOSIS — E1059 Type 1 diabetes mellitus with other circulatory complications: Secondary | ICD-10-CM | POA: Diagnosis not present

## 2019-06-29 MED ORDER — INSULIN GLARGINE 100 UNIT/ML SOLOSTAR PEN: 64.0000 [IU] | PEN_INJECTOR | Freq: Every day | SUBCUTANEOUS | 3 refills | Status: DC

## 2019-06-29 NOTE — Patient Instructions (Addendum)
-  Decrease Lantus 64 units ONCE daily  -Novolog 6 units with each meal  -Novolog correctional insulin: ADD extra units on insulin to your meal-time Novolog  dose if your blood sugars are higher than 155 . Use the scale below to help guide you:   Blood sugar before meal Number of units to inject  Less than 155 0 unit  151 -  180 1 units  181 -  205 2 units  206-  230 3 units  231 -  255 4 units  256 -  280 5 units  281 -  305 6 units  306 -  330 7 units  331 -  355 8 units       HOW TO TREAT LOW BLOOD SUGARS (Blood sugar LESS THAN 70 MG/DL)  Please follow the RULE OF 15 for the treatment of hypoglycemia treatment (when your (blood sugars are less than 70 mg/dL)    STEP 1: Take 15 grams of carbohydrates when your blood sugar is low, which includes:   3-4 GLUCOSE TABS  OR  3-4 OZ OF JUICE OR REGULAR SODA OR  ONE TUBE OF GLUCOSE GEL     STEP 2: RECHECK blood sugar in 15 MINUTES STEP 3: If your blood sugar is still low at the 15 minute recheck --> then, go back to STEP 1 and treat AGAIN with another 15 grams of carbohydrates.

## 2019-06-29 NOTE — Progress Notes (Signed)
Name: Chase Clark  Age/ Sex: 54 y.o., male   MRN/ DOB: [id number], 12-09-1965     PCP: [name], MD   Reason for Endocrinology Evaluation: Type 1 Diabetes Mellitus  Initial Endocrine Consultative Visit: 12/02/2017    PATIENT IDENTIFIER: [name] is a 54 y.o. male with a past medical history of T1DM, HTN, CAD (S/P DES 2011). The patient has followed with Endocrinology clinic since 12/02/2017 for consultative assistance with management of his diabetes.  DIABETIC HISTORY:  [name] was diagnosed with T1DM in 1990, was initially diagnosed as type 2 until years ago when he was diagnosed with T1DM, has been on insulin for ~ 15 yrs. He tried Ozempic 04/2019 with GI intolerance. His hemoglobin A1c has ranged from 5.9%  in 2021, peaking at 15.3%in 2019.  Does computer training for a living   Lives with wife  SUBJECTIVE:   During the last visit (02/01/2018): A1c was 10.8%, MDI regimen adjusted  Today (06/29/2019): [name] is here to re-establish care for diabetes management.  He checks his blood sugars multiple times daily,through the CGM. The patient has had hypoglycemic episodes since the last clinic visit, which typically occur 1 x /week - most often occuring in the afternoon. The patient is  symptomatic with these episodes, with symptoms of sweating  Otherwise, the patient has not required any recent emergency interventions for hypoglycemia and has not had recent hospitalizations secondary to hyper or hypoglycemic episodes.   Eats 2 meals a day ( lunch and dinner ) , snacks 1x during the day.  Avoid sugar sweetened beverages.  If he skips a meal he would drop.    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 Arthritis   . Asthma    as child  . Coronary artery disease    DES DIAG1 11/20/09 [facility])  . Diabetes mellitus without complication ([place])   . Diabetic osteomyelitis ([place])   . Diabetic ulcer of foot with muscle involvement without evidence of necrosis ([place])    DIABETIC ULCERATIONS ASSOCIATED WITH IRRITATION LATERAL ANKLE LEFT GREATER THAN RIGHT WITH MILD CELLULITIS   . Diverticulosis   . DKA (diabetic ketoacidoses) ([place]) 08/23/2017  . Edema, lower extremity   . Fatty liver   . GERD (gastroesophageal reflux disease)   . Gilbert's disease   . Glaucoma   . Heart disease 2011   Patient has stent for 80% blockage  . Hyperlipidemia   . Hypertension   . Neuropathy   . Osteomyelitis of left foot ([place])   . Seasonal allergies   . Shoulder pain   . Ulcer of foot ([place]) 08.06.2014   DIABETIC ULCERATIONS ASSOCIATED WITH IRRITATION LATERAL ANKLE LEFT GREATER THAN RIGHT WITH MILD CELLULITIS   Past Surgical History:  Past Surgical History:  Procedure Laterality Date  . ABDOMINAL AORTOGRAM N/A 05/20/2016   Procedure: Abdominal Aortogram possible intervention;  Surgeon: Katha Cabal, MD;  Location: [place] [facility];  Service: Cardiovascular;  Laterality: N/A;  . AMPUTATION Left 09/05/2017  Procedure: AMPUTATION BELOW KNEE;  Surgeon: Tania Ade, MD;  Location: [place];  Service: Orthopedics;  Laterality: Left;  . ANTERIOR CERVICAL DECOMP/DISCECTOMY FUSION N/A 05/07/2017   Procedure: ANTERIOR CERVICAL DECOMPRESSION/DISCECTOMY [place]ia Maxin PROSTHESIS,PLATE/SCREWS CERVICAL FIVE - CERVICAL SIX;  Surgeon: Newman Pies, MD;  Location: [place];  Service: Neurosurgery;  Laterality: N/A;  . CARDIAC CATHETERIZATION    . CATARACT EXTRACTION W/ INTRAOCULAR LENS IMPLANT    . CATARACT EXTRACTION  W/PHACO Left 02/26/2017   Procedure: CATARACT EXTRACTION PHACO AND INTRAOCULAR LENS PLACEMENT (IOC);  Surgeon: Eulogio Bear, MD;  Location: [facility];  Service: Ophthalmology;  Laterality: Left;  Lot # [id number] H [place]: 00:25.3 AP%: 6.2 CDE: 1.59   . CHOLECYSTECTOMY N/A   . CORONARY ANGIOPLASTY WITH STENT PLACEMENT  2007   1st diagonal  . CORONARY ARTERY BYPASS GRAFT N/A 01/27/2019   Procedure: OFF PUMP CORONARY ARTERY BYPASS GRAFTING (CABG) X 2 WITH ENDOSCOPIC HARVESTING OF RIGHT GREATER SAPHENOUS VEIN. LIMA TO LAD;  Surgeon: Lajuana Matte, MD;  Location: [place];  Service: Open Heart Surgery;  Laterality: N/A;  . CORONARY STENT INTERVENTION N/A 01/21/2019   Procedure: CORONARY STENT INTERVENTION;  Surgeon: Belva Crome, MD;  Location: [place] [facility];  Service: Cardiovascular;  Laterality: N/A;  . EPIBLEPHERON REPAIR WITH TEAR DUCT PROBING    . EYE SURGERY Right sept 29n2015   cataract extraction  . INSERTION EXPRESS TUBE SHUNT Right 09/06/2015   Procedure: INSERTION AHMED TUBE SHUNT with tutoplast allograft;  Surgeon: Eulogio Bear, MD;  Location: [facility];  Service: Ophthalmology;  Laterality: Right;  . LEFT HEART CATH AND CORONARY ANGIOGRAPHY N/A 01/20/2019   Procedure: LEFT HEART CATH AND CORONARY ANGIOGRAPHY;  Surgeon: Lorretta Harp, MD;  Location: [place] [facility];  Service: Cardiovascular;  Laterality: N/A;  . TEE WITHOUT CARDIOVERSION N/A 01/27/2019   Procedure: TRANSESOPHAGEAL ECHOCARDIOGRAM (TEE);  Surgeon: Lajuana Matte, MD;  Location: [place];  Service: Open Heart Surgery;  Laterality: N/A;  . VASECTOMY     Social History:  reports that he quit smoking about 11 years ago. His smoking use included cigarettes. He has never used smokeless tobacco. He reports current alcohol use. He reports that he does not use drugs. Family History:  Family History  Problem Relation Age of Onset  . Hyperlipidemia Mother   . Diabetes Mother   . Liver disease Mother         NASH  . Hyperlipidemia Father   . Diabetes Father   . Heart disease Father   . Hypertension Father      HOME MEDICATIONS: Allergies as of 06/29/2019      Reactions   Ozempic (0.25 Or 0.5 Mg-dose) [semaglutide(0.25 Or 0.5mg -dos)] Nausea And Vomiting, Other (See Comments)   Triggered occular migraines      Medication List       Accurate as of June 29, 2019  8:09 AM. If you have any questions, ask your nurse or doctor.        acetaminophen 325 MG tablet Commonly known as: TYLENOL Take 1 tablet (325 mg total) by mouth every 6 (six) hours as needed for mild pain (pain score 1-3 or temp > 100.5).   amLODipine 5 MG tablet Commonly known as: NORVASC Take 2.5 mg by mouth daily.   aspirin 81 MG chewable tablet Commonly known as: Aspirin Childrens Chew 1 tablet (81 mg total) by mouth daily.   atorvastatin 80 MG tablet Commonly known as: LIPITOR Take 1 tablet (80 mg total) by  mouth daily.   cholecalciferol 25 MCG (1000 UNIT) tablet Commonly known as: VITAMIN D3 Take 1,000 Units by mouth daily.   Combigan 0.2-0.5 % ophthalmic solution Generic drug: brimonidine-timolol Place 1 drop into the right eye every 12 (twelve) hours.   Dexcom G6 Receiver Devi 1 Device by Does not apply route 4 (four) times daily. Use to check blood sugars up to 4 times daily   Dexcom G6 Sensor Misc 1 Device by Does not apply route 4 (four) times daily. Place a new sensor every 10 days to check blood sugar up to 4 times daily   Dexcom G6 Transmitter Misc 1 applicator by Does not apply route 4 (four) times daily.   esomeprazole 20 MG capsule Commonly known as: NEXIUM Take 20 mg by mouth daily.   fexofenadine 180 MG tablet Commonly known as: ALLEGRA Take 180 mg by mouth daily.   fluticasone 50 MCG/ACT nasal spray Commonly known as: FLONASE Place 2 sprays into both nostrils daily.   furosemide 40 MG tablet Commonly known as: LASIX Take 40 mg by mouth daily as needed.   gabapentin 300 MG  capsule Commonly known as: NEURONTIN Take 1 capsule (300 mg total) by mouth 2 (two) times daily.   hydrALAZINE 25 MG tablet Commonly known as: APRESOLINE Take 1 tablet (25 mg total) by mouth 3 (three) times daily.   insulin aspart 100 UNIT/ML injection Commonly known as: novoLOG Inject up to 15 units up to 3 times daily with meals What changed: additional instructions   insulin glargine 100 UNIT/ML Solostar Pen Commonly known as: LANTUS Inject 30 Units into the skin 2 (two) times daily. What changed: how much to take   Insulin Pen Needle 32G X 4 MM Misc Inject 5 pens into the skin daily. Use to inject Lantus and Novolog up to 5 times daily   Lactulose 20 GM/30ML Soln Take 30 mLs (20 g total) by mouth as needed.   latanoprost 0.005 % ophthalmic solution Commonly known as: XALATAN Place 1 drop into both eyes at bedtime.   lisinopril 5 MG tablet Commonly known as: ZESTRIL Take 5 mg by mouth daily.   metoprolol tartrate 50 MG tablet Commonly known as: LOPRESSOR Take 50 mg by mouth 2 (two) times daily.   senna 8.6 MG tablet Commonly known as: SENOKOT Take 1 tablet by mouth daily.   tamsulosin 0.4 MG Caps capsule Commonly known as: FLOMAX Take 1 capsule (0.4 mg total) by mouth daily.        OBJECTIVE:   Vital Signs: BP (!) 158/72 (BP Location: Right Arm, Patient Position: Sitting, Cuff Size: Large)   Pulse 74   Temp 98 F (36.7 C)   Ht 5\' 10"  (1.778 m)   Wt 229 lb (103.9 kg)   SpO2 98%   BMI 32.86 kg/m   Wt Readings from Last 3 Encounters:  06/29/19 229 lb (103.9 kg)  06/07/19 232 lb (105.2 kg)  05/30/19 215 lb (97.5 kg)     Exam: General: Pt appears well and is in NAD  Neck: General: Supple without adenopathy. Thyroid: Thyroid size normal.  No goiter or nodules appreciated. No thyroid bruit.  Lungs: Clear with good BS bilat with no rales, rhonchi, or wheezes  Heart: RRR with normal S1 and S2 and no gallops; no murmurs; no rub  Abdomen: Normoactive  bowel sounds, soft, nontender, without masses or organomegaly palpable  Extremities: No pretibial edema.   Neuro: MS is good with appropriate affect, pt is alert and Ox3  DM foot exam: 06/29/2019  Right foot exam , prosthetic on the left  The skin of the feet is intact without sores or ulcerations. The pedal pulses are 1+ on right  The sensation is decreased  to a screening 5.07, 10 gram monofilament on the right           DATA REVIEWED:  Lab Results  Component Value Date   HGBA1C 5.9 05/24/2019   HGBA1C 9.3 (H) 01/19/2019   HGBA1C 11.2 (H) 12/17/2018   Lab Results  Component Value Date   MICROALBUR 13.5 (H) 01/06/2014   LDLCALC 55 05/24/2019   CREATININE 2.70 (H) 05/24/2019   Lab Results  Component Value Date   MICRALBCREAT 15.5 01/06/2014     Lab Results  Component Value Date   CHOL 126 05/24/2019   HDL 31.70 (L) 05/24/2019   LDLCALC 55 05/24/2019   LDLDIRECT 130.0 12/17/2018   TRIG 199.0 (H) 05/24/2019   CHOLHDL 4 05/24/2019         ASSESSMENT / PLAN / RECOMMENDATIONS:   1) Type 1 Diabetes Mellitus, Poorly controlled, With  CKD IV, Hx of left BKA, retinopathy and macrovascular complications - Most recent A1c of 5.9 %. Goal A1c < 7.5 %.     -His A1c is skewed due to CKD -We were unable to download his CGM today, due to his vision impairment he was unable to follow the directions to link to our account. -I am concerned that with such a low A1c of 5.9% he is having more frequent hypoglycemic episodes.  -I have encouraged the patient to continue to monitor BG's as improving glycemic control will slow down any progression in  Retinopathy, or chronic kidney disease. - Discussed pharmacokinetics of basal/bolus insulin and the importance of taking prandial insulin with meals.  We also discussed avoiding sugar-sweetened beverages and snacks, when possible.    MEDICATIONS: Decrease Lantus 64 units ONCE daily  Novolog 6 units with each meal  CF: NOvolog (  BG-130/25)  EDUCATION / INSTRUCTIONS: BG monitoring instructions: Patient is instructed to check his blood sugars 4 times a day, before meals and bedtime . Call [place] [facility] if: BG persistently < 70 or > 300. I reviewed the Rule of 15 for the treatment of hypoglycemia in detail with the patient. Literature supplied.   2) Diabetic complications:  Eye: Does  have known diabetic retinopathy.  Neuro/ Feet: Does have known diabetic peripheral neuropathy .  Renal: Patient does have known baseline CKD. He   is  on an ACEI/ARB at present.   3) Lipids: Patient is on atorvastatin 80 mg daily, LDL at goal at 55 mg/DL   4) Hypertension: He is above goal of < 140/90 mmHg.    F/U in 3 months    Signed electronically by: Mack Guise, MD  [facility] Endocrinology  [facility] [address] [place], [place] [address] Phone: [phone] FAX: [phone]   CC: [name], MD [address] [place] [address] Phone: [phone]  Fax: [phone]  Return to Endocrinology clinic as below: Future Appointments  Date Time Provider [place]  07/12/2019 10:00 AM [facility] VASC 2 [facility] None  07/12/2019 11:00 AM [facility] VASC 2 [facility] None  07/12/2019 11:30 AM [facility]-NURSE [facility] None  08/01/2019  9:30 AM [facility] CCM PHARMACIST [facility]

## 2019-06-30 DIAGNOSIS — Z89512 Acquired absence of left leg below knee: Secondary | ICD-10-CM | POA: Insufficient documentation

## 2019-06-30 DIAGNOSIS — E1022 Type 1 diabetes mellitus with diabetic chronic kidney disease: Secondary | ICD-10-CM | POA: Insufficient documentation

## 2019-06-30 DIAGNOSIS — E785 Hyperlipidemia, unspecified: Secondary | ICD-10-CM | POA: Insufficient documentation

## 2019-06-30 DIAGNOSIS — E1042 Type 1 diabetes mellitus with diabetic polyneuropathy: Secondary | ICD-10-CM | POA: Insufficient documentation

## 2019-06-30 DIAGNOSIS — E103593 Type 1 diabetes mellitus with proliferative diabetic retinopathy without macular edema, bilateral: Secondary | ICD-10-CM | POA: Insufficient documentation

## 2019-06-30 DIAGNOSIS — N184 Chronic kidney disease, stage 4 (severe): Secondary | ICD-10-CM | POA: Insufficient documentation

## 2019-06-30 DIAGNOSIS — E109 Type 1 diabetes mellitus without complications: Secondary | ICD-10-CM | POA: Insufficient documentation

## 2019-07-05 ENCOUNTER — Other Ambulatory Visit: Payer: Self-pay | Admitting: Physician Assistant

## 2019-07-12 ENCOUNTER — Ambulatory Visit (INDEPENDENT_AMBULATORY_CARE_PROVIDER_SITE_OTHER): Payer: BC Managed Care – PPO | Admitting: Nurse Practitioner

## 2019-07-12 ENCOUNTER — Encounter (INDEPENDENT_AMBULATORY_CARE_PROVIDER_SITE_OTHER): Payer: Self-pay | Admitting: Nurse Practitioner

## 2019-07-12 ENCOUNTER — Other Ambulatory Visit: Payer: Self-pay

## 2019-07-12 ENCOUNTER — Ambulatory Visit (INDEPENDENT_AMBULATORY_CARE_PROVIDER_SITE_OTHER): Payer: BC Managed Care – PPO

## 2019-07-12 VITALS — BP 153/94 | HR 69 | Ht 70.0 in | Wt 226.0 lb

## 2019-07-12 DIAGNOSIS — I739 Peripheral vascular disease, unspecified: Secondary | ICD-10-CM

## 2019-07-12 DIAGNOSIS — I10 Essential (primary) hypertension: Secondary | ICD-10-CM | POA: Diagnosis not present

## 2019-07-12 DIAGNOSIS — M7989 Other specified soft tissue disorders: Secondary | ICD-10-CM

## 2019-07-12 DIAGNOSIS — I872 Venous insufficiency (chronic) (peripheral): Secondary | ICD-10-CM | POA: Diagnosis not present

## 2019-07-12 NOTE — Progress Notes (Signed)
Subjective:    Patient ID: Chase Clark, male    DOB: 1965-08-17, 54 y.o.   MRN: [id number] Chief Complaint  Patient presents with  . Follow-up    u/s follow up    Chase Clark is here for evaluation and noninvasive studies related to swelling in his lower extremities.  The patient has a previous left below-knee amputation following wound infection with peripheral arterial disease.  The patient recently underwent CABG where his right great saphenous vein was utilized, however as a complication the patient suffered permanent vision changes.  He notes that the right lower extremity has more swelling and heaviness.  He does note some swelling in his left stump as well.  The patient notes that during Covid he has been working from home and has been sitting much more than he normally has.  He states that he sits at his computer for 12 to 14 hours/day.  However in the last few weeks he has noticed that the swelling has gotten better because he has been able to go out and walk about twice a day.  He is noticed that his swelling is greatly decreased with this.  Patient also has plans to attend cardiac rehab as he was not able to do so because of COVID-19.  The patient does not have any open sores or ulcerations on his lower extremity.  The patient denies any claudication-like symptoms.  He denies any rest pain like symptoms.  The patient also has a history of chronic kidney disease stage IV.  The patient has not yet utilized medical grade 1 compression stockings.  Today noninvasive studies show a right ABI of 1.11 with a TBI 0.7 this is improved from the prior study on 08/24/2017 where the ABI was 0.88 and the TBI was 0.50.  The patient has monophasic/biphasic waveforms with good toe waveforms in the right lower extremity.  The patient also underwent a venous reflux study on his right lower extremity.  The patient has evidence of reflux in the mid femoral vein.  There was no DVT or superficial venous  thrombosis noted.  The right great saphenous vein was not visualized which would be consistent with vein harvesting previously reported.   Review of Systems  Eyes: Positive for visual disturbance (Legally blind following CABG surgery).  Cardiovascular: Positive for leg swelling.  All other systems reviewed and are negative.      Objective:   Physical Exam Vitals reviewed.  Constitutional:      Appearance: Normal appearance.  HENT:     Head: Normocephalic.  Eyes:     Conjunctiva/sclera: Conjunctivae normal.  Cardiovascular:     Rate and Rhythm: Normal rate and regular rhythm.  Pulmonary:     Effort: Pulmonary effort is normal.  Abdominal:     General: Abdomen is flat.     Palpations: Abdomen is soft.  Musculoskeletal:     Right lower leg: 2+ Edema present.     Left Lower Extremity: Left leg is amputated below knee.  Skin:    General: Skin is warm.  Neurological:     General: No focal deficit present.     Mental Status: He is alert and oriented to person, place, and time.  Psychiatric:        Mood and Affect: Mood normal.        Behavior: Behavior normal.        Thought Content: Thought content normal.        Judgment: Judgment normal.  BP (!) 153/94   Pulse 69   Ht 5\' 10"  (1.778 m)   Wt 226 lb (102.5 kg)   BMI 32.43 kg/m   Past Medical History:  Diagnosis Date  . Arthritis   . Asthma    as child  . Coronary artery disease    DES DIAG1 11/20/09 [facility])  . Diabetes mellitus without complication ([place])   . Diabetic osteomyelitis ([place])   . Diabetic ulcer of foot with muscle involvement without evidence of necrosis ([place])    DIABETIC ULCERATIONS ASSOCIATED WITH IRRITATION LATERAL ANKLE LEFT GREATER THAN RIGHT WITH MILD CELLULITIS   . Diverticulosis   . DKA (diabetic ketoacidoses) ([place]) 08/23/2017  . Edema, lower extremity   . Fatty liver   . GERD (gastroesophageal reflux disease)   . Gilbert's disease   . Glaucoma   . Heart disease 2011   Patient has stent  for 80% blockage  . Hyperlipidemia   . Hypertension   . Neuropathy   . Osteomyelitis of left foot ([place])   . Seasonal allergies   . Shoulder pain   . Ulcer of foot ([place]) 08.06.2014   DIABETIC ULCERATIONS ASSOCIATED WITH IRRITATION LATERAL ANKLE LEFT GREATER THAN RIGHT WITH MILD CELLULITIS    Social History   Socioeconomic History  . Marital status: Married    Spouse name: Not on file  . Number of children: Not on file  . Years of education: Not on file  . Highest education level: Not on file  Occupational History  . Not on file  Tobacco Use  . Smoking status: Former Smoker    Types: Cigarettes    Quit date: 03/14/2008    Years since quitting: 11.3  . Smokeless tobacco: Never Used  Substance and Sexual Activity  . Alcohol use: Yes    Comment: OCCAS  . Drug use: No  . Sexual activity: Not on file  Other Topics Concern  . Not on file  Social History Narrative   Used to work as Audiological scientist    Married    Social Determinants of Radio broadcast assistant Strain:   . Difficulty of Paying Living Expenses:   Food Insecurity:   . Worried About Charity fundraiser in the Last Year:   . Arboriculturist in the Last Year:   Transportation Needs:   . Film/video editor (Medical):   Marland Kitchen Lack of Transportation (Non-Medical):   Physical Activity:   . Days of Exercise per Week:   . Minutes of Exercise per Session:   Stress:   . Feeling of Stress :   Social Connections:   . Frequency of Communication with Friends and Family:   . Frequency of Social Gatherings with Friends and Family:   . Attends Religious Services:   . Active Member of Clubs or Organizations:   . Attends Archivist Meetings:   Marland Kitchen Marital Status:   Intimate Partner Violence:   . Fear of Current or Ex-Partner:   . Emotionally Abused:   Marland Kitchen Physically Abused:   . Sexually Abused:     Past Surgical History:  Procedure Laterality Date  . ABDOMINAL AORTOGRAM N/A 05/20/2016   Procedure: Abdominal  Aortogram possible intervention;  Surgeon: Katha Cabal, MD;  Location: [place] [facility];  Service: Cardiovascular;  Laterality: N/A;  . AMPUTATION Left 09/05/2017   Procedure: AMPUTATION BELOW KNEE;  Surgeon: Tania Ade, MD;  Location: [place];  Service: Orthopedics;  Laterality: Left;  . ANTERIOR CERVICAL DECOMP/DISCECTOMY FUSION N/A  05/07/2017   Procedure: ANTERIOR CERVICAL DECOMPRESSION/DISCECTOMY Luevenia Maxin PROSTHESIS,PLATE/SCREWS CERVICAL FIVE - CERVICAL SIX;  Surgeon: Newman Pies, MD;  Location: [place];  Service: Neurosurgery;  Laterality: N/A;  . CARDIAC CATHETERIZATION    . CATARACT EXTRACTION W/ INTRAOCULAR LENS IMPLANT    . CATARACT EXTRACTION W/PHACO Left 02/26/2017   Procedure: CATARACT EXTRACTION PHACO AND INTRAOCULAR LENS PLACEMENT (IOC);  Surgeon: Eulogio Bear, MD;  Location: [facility];  Service: Ophthalmology;  Laterality: Left;  Lot # [id number] H [place]: 00:25.3 AP%: 6.2 CDE: 1.59   . CHOLECYSTECTOMY N/A   . CORONARY ANGIOPLASTY WITH STENT PLACEMENT  2007   1st diagonal  . CORONARY ARTERY BYPASS GRAFT N/A 01/27/2019   Procedure: OFF PUMP CORONARY ARTERY BYPASS GRAFTING (CABG) X 2 WITH ENDOSCOPIC HARVESTING OF RIGHT GREATER SAPHENOUS VEIN. LIMA TO LAD;  Surgeon: Lajuana Matte, MD;  Location: [place];  Service: Open Heart Surgery;  Laterality: N/A;  . CORONARY STENT INTERVENTION N/A 01/21/2019   Procedure: CORONARY STENT INTERVENTION;  Surgeon: Belva Crome, MD;  Location: [place] [facility];  Service: Cardiovascular;  Laterality: N/A;  . EPIBLEPHERON REPAIR WITH TEAR DUCT PROBING    . EYE SURGERY Right sept 29n2015   cataract extraction  . INSERTION EXPRESS TUBE SHUNT Right 09/06/2015   Procedure: INSERTION AHMED TUBE SHUNT with tutoplast allograft;  Surgeon: Eulogio Bear, MD;  Location: [facility];  Service: Ophthalmology;  Laterality: Right;  . LEFT HEART CATH AND CORONARY ANGIOGRAPHY N/A 01/20/2019   Procedure: LEFT HEART CATH AND CORONARY  ANGIOGRAPHY;  Surgeon: Lorretta Harp, MD;  Location: [facility] [place] [facility];  Service: Cardiovascular;  Laterality: N/A;  . TEE WITHOUT CARDIOVERSION N/A 01/27/2019   Procedure: TRANSESOPHAGEAL ECHOCARDIOGRAM (TEE);  Surgeon: Lajuana Matte, MD;  [facility];  Service: Open Heart Surgery;  Laterality: N/A;  . VASECTOMY      Family History  Problem Relation Age of Onset  . Hyperlipidemia Mother   . Diabetes Mother   . Liver disease Mother        NASH  . Hyperlipidemia Father   . Diabetes Father   . Heart disease Father   . Hypertension Father     Allergies  Allergen Reactions  . Ozempic (0.25 Or 0.5 Mg-Dose) [Semaglutide(0.25 Or 0.5mg -Dos)] Nausea And Vomiting and Other (See Comments)    Triggered occular migraines       Assessment & Plan:   1. PAD (peripheral artery disease) (HCC)  Recommend:  The patient has evidence of atherosclerosis of the lower extremities .  The patient does not voice lifestyle limiting changes at this point in time.  Noninvasive studies do not suggest clinically significant change.  No invasive studies, angiography or surgery at this time The patient should continue walking and begin a more formal exercise program.  The patient should continue antiplatelet therapy and aggressive treatment of the lipid abnormalities  No changes in the patient's medications at this time   Patient should follow-up with noninvasive studies in 1 year sooner if issues should arise.  2. Essential hypertension Continue antihypertensive medications as already ordered, these medications have been reviewed and there are no changes at this time.   3. Chronic venous insufficiency No surgery or intervention at this point in time.    I have reviewed my discussion with the patient regarding venous insufficiency and secondary lymph edema and why it  causes symptoms. I have discussed with the patient the chronic skin changes that accompany these problems and the long  term  sequela such as ulceration and infection.  Patient will continue wearing graduated compression stockings class 1 (20-30 mmHg) on a daily basis a prescription was given to the patient to keep this updated. The patient will  put the stockings on first thing in the morning and removing them in the evening. The patient is instructed specifically not to sleep in the stockings.  In addition, behavioral modification including elevation during the day will be continued.    Previous duplex ultrasound of the lower extremities shows reflux in the deep venous system, superficial reflux was not present.   Following the review of the ultrasound the patient will follow up in 6 months to reassess the degree of swelling and the control that graduated compression is offering.   The patient can be assessed for a Lymph Pump at that time.  However, at this time the patient states they are satisfied with the control compression and elevation is yielding.     Current Outpatient Medications on File Prior to Visit  Medication Sig Dispense Refill  . acetaminophen (TYLENOL) 325 MG tablet Take 1 tablet (325 mg total) by mouth every 6 (six) hours as needed for mild pain (pain score 1-3 or temp > 100.5). 30 tablet 0  . amLODipine (NORVASC) 5 MG tablet Take 2.5 mg by mouth daily.    Marland Kitchen aspirin (ASPIRIN CHILDRENS) 81 MG chewable tablet Chew 1 tablet (81 mg total) by mouth daily. 30 tablet 1  . atorvastatin (LIPITOR) 80 MG tablet Take 1 tablet (80 mg total) by mouth daily. 90 tablet 1  . brimonidine-timolol (COMBIGAN) 0.2-0.5 % ophthalmic solution Place 1 drop into the right eye every 12 (twelve) hours.     . cholecalciferol (VITAMIN D3) 25 MCG (1000 UT) tablet Take 1,000 Units by mouth daily.    . Continuous Blood Gluc Receiver (DEXCOM G6 RECEIVER) DEVI 1 Device by Does not apply route 4 (four) times daily. Use to check blood sugars up to 4 times daily 1 each 1  . Continuous Blood Gluc Sensor (DEXCOM G6 SENSOR) MISC 1 Device  by Does not apply route 4 (four) times daily. Place a new sensor every 10 days to check blood sugar up to 4 times daily 9 each 3  . Continuous Blood Gluc Transmit (DEXCOM G6 TRANSMITTER) MISC 1 applicator by Does not apply route 4 (four) times daily. 1 each 2  . esomeprazole (NEXIUM) 20 MG capsule Take 20 mg by mouth daily.     . fexofenadine (ALLEGRA) 180 MG tablet Take 180 mg by mouth daily.    . fluticasone (FLONASE) 50 MCG/ACT nasal spray Place 2 sprays into both nostrils daily.    . furosemide (LASIX) 40 MG tablet Take 40 mg by mouth daily as needed.    . gabapentin (NEURONTIN) 300 MG capsule Take 1 capsule (300 mg total) by mouth 2 (two) times daily. 60 capsule 6  . insulin aspart (NOVOLOG) 100 UNIT/ML injection Inject up to 15 units up to 3 times daily with meals (Patient taking differently: 3-10 sliding scale) 42 mL 1  . insulin glargine (LANTUS) 100 UNIT/ML Solostar Pen Inject 64 Units into the skin daily. 60 mL 3  . Insulin Pen Needle 32G X 4 MM MISC Inject 5 pens into the skin daily. Use to inject Lantus and Novolog up to 5 times daily 500 each 1  . Lactulose 20 GM/30ML SOLN Take 30 mLs (20 g total) by mouth as needed. 450 mL 1  . latanoprost (XALATAN) 0.005 % ophthalmic solution Place  1 drop into both eyes at bedtime.  5  . lisinopril (ZESTRIL) 5 MG tablet Take 5 mg by mouth daily.    . metoprolol tartrate (LOPRESSOR) 50 MG tablet Take 50 mg by mouth 2 (two) times daily.    Marland Kitchen senna (SENOKOT) 8.6 MG tablet Take 1 tablet by mouth daily.    . tamsulosin (FLOMAX) 0.4 MG CAPS capsule Take 1 capsule (0.4 mg total) by mouth daily. 30 capsule 1  . hydrALAZINE (APRESOLINE) 25 MG tablet Take 1 tablet (25 mg total) by mouth 3 (three) times daily. 270 tablet 3   No current facility-administered medications on file prior to visit.    There are no Patient Instructions on file for this visit. No follow-ups on file.   Kris Hartmann, NP

## 2019-07-28 ENCOUNTER — Other Ambulatory Visit: Payer: Self-pay | Admitting: Family Medicine

## 2019-08-01 ENCOUNTER — Encounter: Payer: Self-pay | Admitting: Pharmacist

## 2019-08-01 ENCOUNTER — Ambulatory Visit: Payer: BC Managed Care – PPO | Admitting: Pharmacist

## 2019-08-01 DIAGNOSIS — Z794 Long term (current) use of insulin: Secondary | ICD-10-CM

## 2019-08-01 DIAGNOSIS — E11319 Type 2 diabetes mellitus with unspecified diabetic retinopathy without macular edema: Secondary | ICD-10-CM

## 2019-08-01 NOTE — Progress Notes (Signed)
Called pt to get readings but no answer,lft vm. Also was going to send pt an invite for his dexcom to upload that way but pt e-mail in chart is incomplete.

## 2019-08-01 NOTE — Chronic Care Management (AMB) (Signed)
Chronic Care Management   Follow Up Note   08/01/2019 Name: Chase Clark MRN: [id number] DOB: 1965-12-06  Referred by: Chase Haven, MD Reason for referral : Chronic Care Management (Medication Management)   Chase Clark is a 54 y.o. year old male who is a primary care patient of Chase Clark, Chase Adam, MD. The CCM team was consulted for assistance with chronic disease management and care coordination needs.    Contacted patient for medication management review. He was unaware of our appointment today and did not have long to talk, as someone was coming to pick him up to take to his second COVID shot.   Review of patient status, including review of consultants reports, relevant laboratory and other test results, and collaboration with appropriate care team members and the patient's provider was performed as part of comprehensive patient evaluation and provision of chronic care management services.    SDOH (Social Determinants of Health) assessments performed: No See Care Plan activities for detailed interventions related to Chase Clark)     Outpatient Encounter Medications as of 08/01/2019  Medication Sig  . acetaminophen (TYLENOL) 325 MG tablet Take 1 tablet (325 mg total) by mouth every 6 (six) hours as needed for mild pain (pain score 1-3 or temp > 100.5).  Marland Kitchen amLODipine (NORVASC) 5 MG tablet Take 2.5 mg by mouth daily.  Marland Kitchen aspirin (ASPIRIN CHILDRENS) 81 MG chewable tablet Chew 1 tablet (81 mg total) by mouth daily.  Marland Kitchen atorvastatin (LIPITOR) 80 MG tablet Take 1 tablet (80 mg total) by mouth daily.  . brimonidine-timolol (COMBIGAN) 0.2-0.5 % ophthalmic solution Place 1 drop into the right eye every 12 (twelve) hours.   . cholecalciferol (VITAMIN D3) 25 MCG (1000 UT) tablet Take 1,000 Units by mouth daily.  . Continuous Blood Gluc Receiver (DEXCOM G6 RECEIVER) DEVI 1 Device by Does not apply route 4 (four) times daily. Use to check blood sugars up to 4 times daily  . Continuous Blood  Gluc Sensor (DEXCOM G6 SENSOR) MISC 1 Device by Does not apply route 4 (four) times daily. Place a new sensor every 10 days to check blood sugar up to 4 times daily  . Continuous Blood Gluc Transmit (DEXCOM G6 TRANSMITTER) MISC 1 applicator by Does not apply route 4 (four) times daily.  Marland Kitchen esomeprazole (NEXIUM) 20 MG capsule Take 20 mg by mouth daily.   . fexofenadine (ALLEGRA) 180 MG tablet Take 180 mg by mouth daily.  . fluticasone (FLONASE) 50 MCG/ACT nasal spray Place 2 sprays into both nostrils daily.  . furosemide (LASIX) 40 MG tablet Take 40 mg by mouth daily as needed.  . gabapentin (NEURONTIN) 300 MG capsule Take 1 capsule (300 mg total) by mouth 2 (two) times daily.  . hydrALAZINE (APRESOLINE) 25 MG tablet Take 1 tablet (25 mg total) by mouth 3 (three) times daily.  . insulin aspart (NOVOLOG) 100 UNIT/ML injection Inject up to 15 units up to 3 times daily with meals (Patient taking differently: 3-10 sliding scale)  . insulin glargine (LANTUS) 100 UNIT/ML Solostar Clark Inject 64 Units into the skin daily.  . Insulin Clark Needle 32G X 4 MM MISC Inject 5 pens into the skin daily. Use to inject Lantus and Novolog up to 5 times daily  . Lactulose 20 GM/30ML SOLN Take 30 mLs (20 g total) by mouth as needed.  . latanoprost (XALATAN) 0.005 % ophthalmic solution Place 1 drop into both eyes at bedtime.  Marland Kitchen lisinopril (ZESTRIL) 5 MG tablet Take 5 mg by  mouth daily.  . metoprolol tartrate (LOPRESSOR) 50 MG tablet Take 50 mg by mouth 2 (two) times daily.  Marland Kitchen senna (SENOKOT) 8.6 MG tablet Take 1 tablet by mouth daily.  . tamsulosin (FLOMAX) 0.4 MG CAPS capsule Take 1 capsule (0.4 mg total) by mouth daily.   No facility-administered encounter medications on file as of 08/01/2019.     Objective:   Goals Addressed            This Visit's Progress     Patient Stated   . "I want to control my diabetes" (pt-stated)       CARE PLAN ENTRY (see longtitudinal plan of care for additional care plan  information)  Current Barriers:  . Diabetes: controlled, most recent A1c 5.9% o Appt w/ endocrinology 06/29/19. Switched from BID Lantus to QD Lantus - patients reports he has had a significant issue with high readings since then, often in 220-230s. Feels like he is constantly "chasing" with Novolog. Plans to switch back to once daily dosing.  o Patient unable to talk for long today, as his ride arrived to take him to get his second COVID shot o He is unsure if his wife has been able to link his DexCom readings to Dr. Quin [facility]op clinic . Most recent eGFR ~25 mL/min . Current antihyperglycemic regimen: Lantus 64 units daily, Novolog 6 units w/ each meal o Previously tried: Ozempic; significant GI upset . Current glucose readings: unable to review today . 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 Unable to review recent home BP readings d/t time restrictions of our visit o Current hyperlipidemia regimen: atorvastatin 80 mg daily; LDL at goal <70 o Antiplatelet regimen: ASA 81 mg daily  Pharmacist Clinical Goal(s):  Marland Kitchen Over the next 90 days, patient will work with PharmD and primary care provider to address optimized glycemic benefit  Interventions: . Inter-disciplinary care team collaboration (see longitudinal plan of care) . Encouraged patient to contact endocrinology to discuss his concerns with higher blood sugars prior to self-adjusting regimen. Will route my note to Dr. Kelton Pillar for [place].  . Encouraged patient to talk to his wife, Chase Clark, who manages his medications and call me back if they have further questions or concerns to discuss with me today. Will also send a MyChart with this request, as well as date and time of next follow up.    Patient Self Care Activities:  . Patient will check blood glucose up to QID w/ Dexcom , document, and provide at future appointment . Patient will take medications as prescribed . Patient will report any questions or concerns to provider   Please see past updates related to this goal by clicking on the "Past Updates" button in the selected goal          Plan:  - Scheduled f/u call 09/08/19 at 3:45 pm  Catie Darnelle Maffucci, PharmD, [place], [place] Pharmacist [place] [phone]

## 2019-08-01 NOTE — Progress Notes (Signed)
Pt stated that his wife is a Marine scientist and has been working 12 hour shifts and has not had a chance to link his dexcom with the code provided. I did however explain to pt that either we need that to be linked or that I will need to speak to his wife to get his readings.

## 2019-08-01 NOTE — Patient Instructions (Signed)
Visit Information  Goals Addressed            This Visit's Progress     Patient Stated   . "I want to control my diabetes" (pt-stated)       CARE PLAN ENTRY (see longtitudinal plan of care for additional care plan information)  Current Barriers:  . Diabetes: controlled, most recent A1c 5.9% o Appt w/ endocrinology 06/29/19. Switched from BID Lantus to QD Lantus - patients reports he has had a significant issue with high readings since then, often in 220-230s. Feels like he is constantly "chasing" with Novolog. Plans to switch back to once daily dosing.  o Patient unable to talk for long today, as his ride arrived to take him to get his second COVID shot o He is unsure if his wife has been able to link his DexCom readings to Dr. Quin Hoop clinic . Most recent eGFR ~25 mL/min . Current antihyperglycemic regimen: Lantus 64 units daily, Novolog 6 units w/ each meal o Previously tried: Ozempic; significant GI upset . Current glucose readings: unable to review today . 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 Unable to review recent home BP readings d/t time restrictions of our visit o Current hyperlipidemia regimen: atorvastatin 80 mg daily; LDL at goal <70 o Antiplatelet regimen: ASA 81 mg daily  Pharmacist Clinical Goal(s):  Marland Kitchen Over the next 90 days, patient will work with PharmD and primary care provider to address optimized glycemic benefit  Interventions: . Inter-disciplinary care team collaboration (see longitudinal plan of care) . Encouraged patient to contact endocrinology to discuss his concerns with higher blood sugars prior to self-adjusting regimen. Will route my note to Dr. Kelton Pillar for [place].  . Encouraged patient to talk  to his wife, [name], who manages his medications and call me back if they have further questions or concerns to discuss with me today. Will also send a MyChart with this request, as well as date and time of next follow up.   Patient Self Care Activities:  . Patient will check blood glucose up to QID w/ Dexcom , document, and provide at future appointment . Patient will take medications as prescribed . Patient will report any questions or concerns to provider   Please see past updates related to this goal by clicking on the "Past Updates" button in the selected goal         Patient verbalizes understanding of instructions provided today.   Plan:  - Scheduled f/u call 09/08/19 at 3:45 pm  Catie Darnelle Maffucci, PharmD, [place], [place] Pharmacist  [place]  [phone]

## 2019-08-01 NOTE — Progress Notes (Signed)
Pt stated that he is already on novolog and lantus. Also that he is blind and would have to wait until his wife is home to provide readings.

## 2019-08-11 DIAGNOSIS — E113513 Type 2 diabetes mellitus with proliferative diabetic retinopathy with macular edema, bilateral: Secondary | ICD-10-CM | POA: Diagnosis not present

## 2019-08-16 ENCOUNTER — Encounter (INDEPENDENT_AMBULATORY_CARE_PROVIDER_SITE_OTHER): Payer: BC Managed Care – PPO | Admitting: Ophthalmology

## 2019-08-16 DIAGNOSIS — H35033 Hypertensive retinopathy, bilateral: Secondary | ICD-10-CM | POA: Diagnosis not present

## 2019-08-16 DIAGNOSIS — I10 Essential (primary) hypertension: Secondary | ICD-10-CM | POA: Diagnosis not present

## 2019-08-16 DIAGNOSIS — H43813 Vitreous degeneration, bilateral: Secondary | ICD-10-CM

## 2019-08-16 DIAGNOSIS — E10311 Type 1 diabetes mellitus with unspecified diabetic retinopathy with macular edema: Secondary | ICD-10-CM

## 2019-08-16 DIAGNOSIS — E103513 Type 1 diabetes mellitus with proliferative diabetic retinopathy with macular edema, bilateral: Secondary | ICD-10-CM | POA: Diagnosis not present

## 2019-08-20 ENCOUNTER — Other Ambulatory Visit: Payer: Self-pay | Admitting: Physician Assistant

## 2019-08-20 ENCOUNTER — Other Ambulatory Visit: Payer: Self-pay | Admitting: Family Medicine

## 2019-08-23 ENCOUNTER — Ambulatory Visit: Payer: Self-pay | Admitting: Pharmacist

## 2019-08-23 DIAGNOSIS — E1139 Type 2 diabetes mellitus with other diabetic ophthalmic complication: Secondary | ICD-10-CM

## 2019-08-23 DIAGNOSIS — IMO0002 Reserved for concepts with insufficient information to code with codable children: Secondary | ICD-10-CM

## 2019-08-23 MED ORDER — DEXCOM G6 RECEIVER DEVI: 1 refills | Status: DC

## 2019-08-23 MED ORDER — DEXCOM G6 TRANSMITTER MISC: 3 refills | Status: DC

## 2019-08-23 MED ORDER — DEXCOM G6 SENSOR MISC: 3 refills | Status: DC

## 2019-08-23 NOTE — Chronic Care Management (AMB) (Signed)
Chronic Care Management   Follow Up Note   08/23/2019 Name: Chase Clark MRN: [id number] DOB: 12-27-1965  Referred by: Chase Haven, MD Reason for referral : Chronic Care Management (Medication Management)   Chase Clark is a 54 y.o. year old male who is a primary care patient of [name], [name], MD. The CCM team was consulted for assistance with chronic disease management and care coordination needs.    Received call from patient's wife today with medication management needs.   Review of patient status, including review of consultants reports, relevant laboratory and other test results, and collaboration with appropriate care team members and the patient's provider was performed as part of comprehensive patient evaluation and provision of chronic care management services.    SDOH (Social Determinants of Health) assessments performed: No See Care Plan activities for detailed interventions related to [facility])     Outpatient Encounter Medications as of 08/23/2019  Medication Sig  . acetaminophen (TYLENOL) 325 MG tablet Take 1 tablet (325 mg total) by mouth every 6 (six) hours as needed for mild pain (pain score 1-3 or temp > 100.5).  Marland Kitchen amLODipine (NORVASC) 5 MG tablet Take 2.5 mg by mouth daily.  Marland Kitchen aspirin (ASPIRIN CHILDRENS) 81 MG chewable tablet Chew 1 tablet (81 mg total) by mouth daily.  Marland Kitchen atorvastatin (LIPITOR) 80 MG tablet Take 1 tablet (80 mg total) by mouth daily.  . brimonidine-timolol (COMBIGAN) 0.2-0.5 % ophthalmic solution Place 1 drop into the right eye every 12 (twelve) hours.   . cholecalciferol (VITAMIN D3) 25 MCG (1000 UT) tablet Take 1,000 Units by mouth daily.  . Continuous Blood Gluc Receiver (DEXCOM G6 RECEIVER) DEVI Use to check blood sugars at least 4 times daily  . Continuous Blood Gluc Sensor (DEXCOM G6 SENSOR) MISC Place a new sensor every 10 days to check blood sugar at least 4 times daily  . Continuous Blood Gluc Transmit (DEXCOM G6 TRANSMITTER)  MISC Use to check blood sugar at least 4 times daily  . esomeprazole (NEXIUM) 20 MG capsule Take 20 mg by mouth daily.   . fexofenadine (ALLEGRA) 180 MG tablet Take 180 mg by mouth daily.  . fluticasone (FLONASE) 50 MCG/ACT nasal spray Place 2 sprays into both nostrils daily.  . furosemide (LASIX) 40 MG tablet Take 40 mg by mouth daily as needed.  . gabapentin (NEURONTIN) 300 MG capsule Take 1 capsule (300 mg total) by mouth 2 (two) times daily.  . hydrALAZINE (APRESOLINE) 25 MG tablet Take 1 tablet (25 mg total) by mouth 3 (three) times daily.  . insulin aspart (NOVOLOG) 100 UNIT/ML injection Inject up to 15 units up to 3 times daily with meals (Patient taking differently: 3-10 sliding scale)  . insulin glargine (LANTUS) 100 UNIT/ML Solostar Pen Inject 64 Units into the skin daily.  . Insulin Pen Needle 32G X 4 MM MISC Inject 5 pens into the skin daily. Use to inject Lantus and Novolog up to 5 times daily  . Lactulose 20 GM/30ML SOLN Take 30 mLs (20 g total) by mouth as needed.  . latanoprost (XALATAN) 0.005 % ophthalmic solution Place 1 drop into both eyes at bedtime.  Marland Kitchen lisinopril (ZESTRIL) 5 MG tablet Take 5 mg by mouth daily.  . metoprolol tartrate (LOPRESSOR) 100 MG tablet TAKE 0.5 TABLETS (50 MG TOTAL) BY MOUTH 2 (TWO) TIMES DAILY.  . metoprolol tartrate (LOPRESSOR) 50 MG tablet Take 50 mg by mouth 2 (two) times daily.  Marland Kitchen senna (SENOKOT) 8.6 MG tablet Take 1  tablet by mouth daily.  . tamsulosin (FLOMAX) 0.4 MG CAPS capsule Take 1 capsule (0.4 mg total) by mouth daily.  . [DISCONTINUED] Continuous Blood Gluc Receiver (DEXCOM G6 RECEIVER) DEVI 1 Device by Does not apply route 4 (four) times daily. Use to check blood sugars up to 4 times daily  . [DISCONTINUED] Continuous Blood Gluc Sensor (DEXCOM G6 SENSOR) MISC 1 Device by Does not apply route 4 (four) times daily. Place a new sensor every 10 days to check blood sugar up to 4 times daily  . [DISCONTINUED] Continuous Blood Gluc Transmit  (DEXCOM G6 TRANSMITTER) MISC 1 applicator by Does not apply route 4 (four) times daily.   No facility-administered encounter medications on file as of 08/23/2019.     Objective:   Goals Addressed            This Visit's Progress     Patient Stated   . "I want to control my diabetes" (pt-stated)       CARE PLAN ENTRY (see longtitudinal plan of care for additional care plan information)  Current Barriers:  . Diabetes: controlled, most recent A1c 5.9% o Wife calls today requesting refill on DexCom system . Most recent eGFR ~25 mL/min . Current antihyperglycemic regimen: Lantus 64 units daily, Novolog 6 units w/ each meal o Previously tried: Ozempic; significant GI upset . Current glucose readings: reports "having a hard time keeping him under 200" . 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 Unable to review recent home BP readings d/t time restrictions of our visit o Current hyperlipidemia regimen: atorvastatin 80 mg daily; LDL at goal <70 o Antiplatelet regimen: ASA 81 mg daily  Pharmacist Clinical Goal(s):  Marland Kitchen Over the next 90 days, patient will work with PharmD and primary care provider to address optimized glycemic benefit  Interventions: . Inter-disciplinary care team collaboration (see longitudinal plan of care) . Encouraged patient to contact endocrinology to discuss his concerns with higher blood sugars prior to self-adjusting regimen. Will route my note to Dr. Kelton Pillar for [place].  Marland Kitchen As patient was about to run out of sensors, resent orders for all DexCom parts today (sensor, receiver, transmitter). May need to encourage to be ordered by endocrinology if any coverage issues moving forward . Provided [name] the phone number for  endocrinology, as well as his currently scheduled follow up in June. Encouraged her to call to schedule a sooner appointment to discuss hyperglycemia. Terra verbalized understanding.   Patient Self Care Activities:  . Patient will check blood glucose up to QID w/ Dexcom , document, and provide at future appointment . Patient will take medications as prescribed . Patient will report any questions or concerns to provider   Please see past updates related to this goal by clicking on the "Past Updates" button in the selected goal          Plan:  - Will outreach as previously scheduled  Catie Darnelle Maffucci, PharmD, [place], [place] Pharmacist [place] [phone]

## 2019-08-23 NOTE — Patient Instructions (Signed)
Visit Information  Goals Addressed            This Visit's Progress     Patient Stated   . "I want to control my diabetes" (pt-stated)       CARE PLAN ENTRY (see longtitudinal plan of care for additional care plan information)  Current Barriers:  . Diabetes: controlled, most recent A1c 5.9% o Wife calls today requesting refill on DexCom system . Most recent eGFR ~25 mL/min . Current antihyperglycemic regimen: Lantus 64 units daily, Novolog 6 units w/ each meal o Previously tried: Ozempic; significant GI upset . Current glucose readings: reports "having a hard time keeping him under 200" . 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 Unable to review recent home BP readings d/t time restrictions of our visit o Current hyperlipidemia regimen: atorvastatin 80 mg daily; LDL at goal <70 o Antiplatelet regimen: ASA 81 mg daily  Pharmacist Clinical Goal(s):  Marland Kitchen Over the next 90 days, patient will work with PharmD and primary care provider to address optimized glycemic benefit  Interventions: . Inter-disciplinary care team collaboration (see longitudinal plan of care) . Encouraged patient to contact endocrinology to discuss his concerns with higher blood sugars prior to self-adjusting regimen. Will route my note to Dr. Kelton Pillar for [place].  Marland Kitchen As patient was about to run out of sensors, resent orders for all DexCom parts today (sensor, receiver, transmitter). May need to encourage to be ordered by endocrinology if any coverage issues moving forward . Provided [name] the phone number for endocrinology, as well as his currently scheduled follow up in June. Encouraged her to call to schedule a sooner appointment to discuss hyperglycemia. Terra  verbalized understanding.   Patient Self Care Activities:  . Patient will check blood glucose up to QID w/ Dexcom , document, and provide at future appointment . Patient will take medications as prescribed . Patient will report any questions or concerns to provider   Please see past updates related to this goal by clicking on the "Past Updates" button in the selected goal         Patient verbalizes understanding of instructions provided today.   Plan:  - Will outreach as previously scheduled  Catie Darnelle Maffucci, PharmD, [place], [place] Pharmacist [place] [phone]

## 2019-08-31 ENCOUNTER — Other Ambulatory Visit: Payer: Self-pay | Admitting: Family Medicine

## 2019-08-31 DIAGNOSIS — E1169 Type 2 diabetes mellitus with other specified complication: Secondary | ICD-10-CM

## 2019-09-08 ENCOUNTER — Ambulatory Visit: Payer: BC Managed Care – PPO | Admitting: Pharmacist

## 2019-09-08 DIAGNOSIS — E1169 Type 2 diabetes mellitus with other specified complication: Secondary | ICD-10-CM

## 2019-09-08 DIAGNOSIS — E11319 Type 2 diabetes mellitus with unspecified diabetic retinopathy without macular edema: Secondary | ICD-10-CM

## 2019-09-08 DIAGNOSIS — Z794 Long term (current) use of insulin: Secondary | ICD-10-CM

## 2019-09-08 DIAGNOSIS — I10 Essential (primary) hypertension: Secondary | ICD-10-CM

## 2019-09-08 NOTE — Patient Instructions (Signed)
Visit Information  Goals Addressed            This Visit's Progress     Patient Stated   . "I want to control my diabetes" (pt-stated)       CARE PLAN ENTRY (see longtitudinal plan of care for additional care plan information)  Current Barriers:  . Diabetes: controlled, most recent A1c 5.9% o Patient reports that since switching back to BID Lantus, his sugars have been better controlled. Aware of follow up next month with endocrinology. . Most recent eGFR ~25 mL/min . Current antihyperglycemic regimen: Lantus 45 units daily BID, Novolog 1-10 units sliding scale with meals o Previously tried: Ozempic; significant GI upset . Current glucose readings: unable to verbalize as his wife manages most aspects of his care . 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 Ubaldo Glassing about an order for cardiac rehab, as he never did this after CABG d/t pandemic restrictions o Current hypertensive regimen: amlodipine 2.5 mg daily, lisinopril 5 mg daily, metoprolol tartrate 50 mg BID, hydralazine 25 mg TID; he is not sure how his BP has been running as his wife checks it, but does not note any concerns of high or low readings o Current hyperlipidemia regimen: atorvastatin 80 mg daily; LDL at goal <70 o Antiplatelet regimen: ASA 81 mg daily  Pharmacist Clinical Goal(s):  Marland Kitchen Over the next 90 days, patient will work with PharmD and primary care provider to address optimized glycemic benefit  Interventions: . Comprehensive medication review performed, medication list updated in electronic medical record . Inter-disciplinary care team collaboration (see longitudinal plan of care) . Reviewed goal A1c, goal fasting, and goal 2 hour post prandial. Encouraged continued communication and follow up with endocrinology . Encouraged him to pursue cardiac rehab. Encouraged to discuss w/ Dr. Ubaldo Glassing.  . As wife manages medications,  encouraged patient to tell wife to call me with any questions or concerns.   Patient Self Care Activities:  . Patient will check blood glucose up to QID w/ Dexcom , document, and provide at future appointment . Patient will take medications as prescribed . Patient will report any questions or concerns to provider   Please see past updates related to this goal by clicking on the "Past Updates" button in the selected goal         Patient verbalizes understanding of instructions provided today.   Plan:  - Scheduled f/u call in ~ 12 weeks  Catie Darnelle Maffucci, PharmD, [place], [place] Pharmacist [place] [phone]

## 2019-09-08 NOTE — Chronic Care Management (AMB) (Signed)
Chronic Care Management   Follow Up Note   09/08/2019 Name: Chase Clark MRN: [id number] DOB: 09/16/1965  Referred by: [name], MD Reason for referral : Chronic Care Management (Medication Management)   Chase Clark is a 54 y.o. year old male who is a primary care patient of [name], [name], MD. The CCM team was consulted for assistance with chronic disease management and care coordination needs.    Contacted patient for medication management follow up.  Review of patient status, including review of consultants reports, relevant laboratory and other test results, and collaboration with appropriate care team members and the patient's provider was performed as part of comprehensive patient evaluation and provision of chronic care management services.    SDOH (Social Determinants of Health) assessments performed: No See Care Plan activities for detailed interventions related to [facility])     Outpatient Encounter Medications as of 09/08/2019  Medication Sig Note  . amLODipine (NORVASC) 5 MG tablet Take 2.5 mg by mouth daily.   Marland Kitchen aspirin (ASPIRIN CHILDRENS) 81 MG chewable tablet Chew 1 tablet (81 mg total) by mouth daily.   Marland Kitchen atorvastatin (LIPITOR) 80 MG tablet TAKE 1 TABLET BY MOUTH EVERY DAY   . brimonidine-timolol (COMBIGAN) 0.2-0.5 % ophthalmic solution Place 1 drop into the right eye every 12 (twelve) hours.    . cholecalciferol (VITAMIN D3) 25 MCG (1000 UT) tablet Take 1,000 Units by mouth daily.   . Continuous Blood Gluc Receiver (DEXCOM G6 RECEIVER) DEVI Use to check blood sugars at least 4 times daily   . Continuous Blood Gluc Sensor (DEXCOM G6 SENSOR) MISC Place a new sensor every 10 days to check blood sugar at least 4 times daily   . Continuous Blood Gluc Transmit (DEXCOM G6 TRANSMITTER) MISC Use to check blood sugar at least 4 times daily   . esomeprazole (NEXIUM) 20 MG capsule Take 20 mg by mouth daily.    . fexofenadine (ALLEGRA) 180 MG tablet Take 180 mg by  mouth daily.   . fluticasone (FLONASE) 50 MCG/ACT nasal spray Place 2 sprays into both nostrils daily.   . furosemide (LASIX) 40 MG tablet Take 40 mg by mouth daily as needed.   . gabapentin (NEURONTIN) 300 MG capsule Take 1 capsule (300 mg total) by mouth 2 (two) times daily.   . hydrALAZINE (APRESOLINE) 25 MG tablet Take 1 tablet (25 mg total) by mouth 3 (three) times daily.   . insulin aspart (NOVOLOG) 100 UNIT/ML injection Inject up to 15 units up to 3 times daily with meals (Patient taking differently: 3-10 sliding scale)   . insulin glargine (LANTUS) 100 UNIT/ML Solostar Pen Inject 64 Units into the skin daily. 09/08/2019: 45 units BID  . Insulin Pen Needle 32G X 4 MM MISC Inject 5 pens into the skin daily. Use to inject Lantus and Novolog up to 5 times daily   . lisinopril (ZESTRIL) 5 MG tablet Take 5 mg by mouth daily.   . metoprolol tartrate (LOPRESSOR) 100 MG tablet TAKE 0.5 TABLETS (50 MG TOTAL) BY MOUTH 2 (TWO) TIMES DAILY.   Marland Kitchen senna (SENOKOT) 8.6 MG tablet Take 1 tablet by mouth daily.   . tamsulosin (FLOMAX) 0.4 MG CAPS capsule Take 1 capsule (0.4 mg total) by mouth daily.   Marland Kitchen acetaminophen (TYLENOL) 325 MG tablet Take 1 tablet (325 mg total) by mouth every 6 (six) hours as needed for mild pain (pain score 1-3 or temp > 100.5).   . Lactulose 20 GM/30ML SOLN Take 30  mLs (20 g total) by mouth as needed.   . latanoprost (XALATAN) 0.005 % ophthalmic solution Place 1 drop into both eyes at bedtime.   . [DISCONTINUED] metoprolol tartrate (LOPRESSOR) 50 MG tablet Take 50 mg by mouth 2 (two) times daily.    No facility-administered encounter medications on file as of 09/08/2019.     Objective:   Goals Addressed            This Visit's Progress     Patient Stated   . "I want to control my diabetes" (pt-stated)       CARE PLAN ENTRY (see longtitudinal plan of care for additional care plan information)  Current Barriers:  . Diabetes: controlled, most recent A1c 5.9% o Patient  reports that since switching back to BID Lantus, his sugars have been better controlled. Aware of follow up next month with endocrinology. . Most recent eGFR ~25 mL/min . Current antihyperglycemic regimen: Lantus 45 units daily BID, Novolog 1-10 units sliding scale with meals o Previously tried: Ozempic; significant GI upset . Current glucose readings: unable to verbalize as his wife manages most aspects of his care . 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 Ubaldo Glassing about an order for cardiac rehab, as he never did this after CABG d/t pandemic restrictions o Current hypertensive regimen: amlodipine 2.5 mg daily, lisinopril 5 mg daily, metoprolol tartrate 50 mg BID, hydralazine 25 mg TID; he is not sure how his BP has been running as his wife checks it, but does not note any concerns of high or low readings o Current hyperlipidemia regimen: atorvastatin 80 mg daily; LDL at goal <70 o Antiplatelet regimen: ASA 81 mg daily  Pharmacist Clinical Goal(s):  Marland Kitchen Over the next 90 days, patient will work with PharmD and primary care provider to address optimized glycemic benefit  Interventions: . Comprehensive medication review performed, medication list updated in electronic medical record . Inter-disciplinary care team collaboration (see longitudinal plan of care) . Reviewed goal A1c, goal fasting, and goal 2 hour post prandial. Encouraged continued communication and follow up with endocrinology . Encouraged him to pursue cardiac rehab. Encouraged to discuss w/ Dr. Ubaldo Glassing.  . As wife manages medications, encouraged patient to tell wife to call me with any questions or concerns.   Patient Self Care Activities:  . Patient will check blood glucose up to QID w/ Dexcom , document, and provide at future appointment . Patient will take medications as prescribed . Patient will report any questions or concerns to provider    Please see past updates related to this goal by clicking on the "Past Updates" button in the selected goal          Plan:  - Scheduled f/u call in ~ 12 weeks  Catie Darnelle Maffucci, PharmD, [place], [place] Pharmacist [place] [phone]

## 2019-09-29 ENCOUNTER — Ambulatory Visit: Payer: BC Managed Care – PPO | Admitting: Internal Medicine

## 2019-11-13 ENCOUNTER — Other Ambulatory Visit: Payer: Self-pay | Admitting: Family Medicine

## 2019-11-18 ENCOUNTER — Ambulatory Visit: Payer: Self-pay | Admitting: Internal Medicine

## 2019-11-24 ENCOUNTER — Telehealth: Payer: Self-pay | Admitting: *Deleted

## 2019-11-24 NOTE — Chronic Care Management (AMB) (Signed)
  Care Management   Note  11/24/2019 Name: Chase Clark MRN: [id number] DOB: 14-Sep-1965  Chase Clark is a 54 y.o. year old male who is a primary care patient of [name], MD and is actively engaged with the care management team. I reached out to [name] by phone today to assist with re-scheduling a follow up visit with the Pharmacist.  Follow up plan: Telephone appointment with care management team member scheduled for: 12/21/2019   [place], [place], [place] [address] Direct Dial: [email or web address]: [email or web address]

## 2019-11-26 ENCOUNTER — Other Ambulatory Visit: Payer: Self-pay | Admitting: Physician Assistant

## 2019-11-29 ENCOUNTER — Other Ambulatory Visit: Payer: Self-pay | Admitting: Family Medicine

## 2019-11-29 DIAGNOSIS — IMO0002 Reserved for concepts with insufficient information to code with codable children: Secondary | ICD-10-CM

## 2019-11-29 DIAGNOSIS — E1139 Type 2 diabetes mellitus with other diabetic ophthalmic complication: Secondary | ICD-10-CM

## 2019-12-01 ENCOUNTER — Other Ambulatory Visit: Payer: Self-pay | Admitting: Thoracic Surgery (Cardiothoracic Vascular Surgery)

## 2019-12-01 ENCOUNTER — Other Ambulatory Visit: Payer: Self-pay | Admitting: Family Medicine

## 2019-12-01 DIAGNOSIS — E1169 Type 2 diabetes mellitus with other specified complication: Secondary | ICD-10-CM

## 2019-12-01 DIAGNOSIS — E785 Hyperlipidemia, unspecified: Secondary | ICD-10-CM

## 2019-12-05 ENCOUNTER — Telehealth: Payer: BC Managed Care – PPO

## 2019-12-21 ENCOUNTER — Ambulatory Visit: Payer: Managed Care, Other (non HMO) | Admitting: Pharmacist

## 2019-12-21 DIAGNOSIS — E1169 Type 2 diabetes mellitus with other specified complication: Secondary | ICD-10-CM

## 2019-12-21 DIAGNOSIS — I10 Essential (primary) hypertension: Secondary | ICD-10-CM

## 2019-12-21 DIAGNOSIS — E785 Hyperlipidemia, unspecified: Secondary | ICD-10-CM

## 2019-12-21 DIAGNOSIS — E103593 Type 1 diabetes mellitus with proliferative diabetic retinopathy without macular edema, bilateral: Secondary | ICD-10-CM

## 2019-12-21 DIAGNOSIS — E1139 Type 2 diabetes mellitus with other diabetic ophthalmic complication: Secondary | ICD-10-CM

## 2019-12-21 DIAGNOSIS — IMO0002 Reserved for concepts with insufficient information to code with codable children: Secondary | ICD-10-CM

## 2019-12-21 MED ORDER — NOVOLOG FLEXPEN 100 UNIT/ML ~~LOC~~ SOPN: PEN_INJECTOR | SUBCUTANEOUS | 0 refills | Status: DC

## 2019-12-21 MED ORDER — INSULIN GLARGINE 100 UNIT/ML SOLOSTAR PEN: 45.0000 [IU] | PEN_INJECTOR | Freq: Two times a day (BID) | SUBCUTANEOUS | 0 refills | Status: DC

## 2019-12-21 NOTE — Chronic Care Management (AMB) (Signed)
Chronic Care Management   Follow Up Note   12/21/2019 Name: Chase Clark MRN: [id number] DOB: May 23, 1965  Referred by: [name], MD Reason for referral : Chronic Care Management (Medication Management)   Chase Clark is a 54 y.o. year old male who is a primary care patient of [name], [name], MD. The CCM team was consulted for assistance with chronic disease management and care coordination needs.  Contacted patient for medication management review.    Review of patient status, including review of consultants reports, relevant laboratory and other test results, and collaboration with appropriate care team members and the patient's provider was performed as part of comprehensive patient evaluation and provision of chronic care management services.    SDOH (Social Determinants of Health) assessments performed: Yes See Care Plan activities for detailed interventions related to SDOH)  SDOH Interventions     Most Recent Value  SDOH Interventions  Financial Strain Interventions Intervention Not Indicated  Stress Interventions Intervention Not Indicated       Outpatient Encounter Medications as of 12/21/2019  Medication Sig Note  . acetaminophen (TYLENOL) 325 MG tablet Take 1 tablet (325 mg total) by mouth every 6 (six) hours as needed for mild pain (pain score 1-3 or temp > 100.5).   Marland Kitchen amLODipine (NORVASC) 5 MG tablet Take 2.5 mg by mouth daily.   Marland Kitchen aspirin (ASPIRIN CHILDRENS) 81 MG chewable tablet Chew 1 tablet (81 mg total) by mouth daily.   Marland Kitchen atorvastatin (LIPITOR) 80 MG tablet TAKE 1 TABLET BY MOUTH EVERY DAY   . brimonidine-timolol (COMBIGAN) 0.2-0.5 % ophthalmic solution Place 1 drop into the right eye every 12 (twelve) hours.    . cholecalciferol (VITAMIN D3) 25 MCG (1000 UT) tablet Take 1,000 Units by mouth daily.   . Continuous Blood Gluc Receiver (DEXCOM G6 RECEIVER) DEVI Use to check blood sugars at least 4 times daily   . Continuous Blood Gluc Sensor (DEXCOM  G6 SENSOR) MISC Place a new sensor every 10 days to check blood sugar at least 4 times daily   . Continuous Blood Gluc Transmit (DEXCOM G6 TRANSMITTER) MISC Use to check blood sugar at least 4 times daily   . dorzolamide (TRUSOPT) 2 % ophthalmic solution 1 drop 2 (two) times daily.   Marland Kitchen esomeprazole (NEXIUM) 20 MG capsule Take 20 mg by mouth daily.    . fexofenadine (ALLEGRA) 180 MG tablet Take 180 mg by mouth daily.   . furosemide (LASIX) 40 MG tablet Take 40 mg by mouth daily as needed.   . gabapentin (NEURONTIN) 300 MG capsule Take 1 capsule (300 mg total) by mouth 2 (two) times daily.   . hydrALAZINE (APRESOLINE) 25 MG tablet Take 1 tablet (25 mg total) by mouth 3 (three) times daily.   . insulin aspart (NOVOLOG FLEXPEN) 100 UNIT/ML FlexPen Inject up to 15 units TID with meals. Further refills to come from endocrinology   . insulin glargine (LANTUS) 100 UNIT/ML Solostar Pen Inject 45 Units into the skin 2 (two) times daily. Further refills to come from endocrinology.   . Insulin Pen Needle 32G X 4 MM MISC Inject 5 pens into the skin daily. Use to inject Lantus and Novolog up to 5 times daily   . latanoprost (XALATAN) 0.005 % ophthalmic solution Place 1 drop into both eyes at bedtime.   Marland Kitchen lisinopril (ZESTRIL) 5 MG tablet Take 5 mg by mouth daily.   . metoprolol tartrate (LOPRESSOR) 100 MG tablet TAKE 0.5 TABLETS (50 MG TOTAL) BY  MOUTH 2 (TWO) TIMES DAILY.   Marland Kitchen senna (SENOKOT) 8.6 MG tablet Take 1 tablet by mouth daily.   . tamsulosin (FLOMAX) 0.4 MG CAPS capsule Take 1 capsule (0.4 mg total) by mouth daily.   . [DISCONTINUED] insulin glargine (LANTUS) 100 UNIT/ML Solostar Pen Inject 64 Units into the skin daily. 09/08/2019: 45 units BID  . [DISCONTINUED] NOVOLOG FLEXPEN 100 UNIT/ML FlexPen INJECT UP TO 15 UNITS UP TO 3 TIMES DAILY WITH MEALS   . fluticasone (FLONASE) 50 MCG/ACT nasal spray SPRAY 2 SPRAYS INTO EACH NOSTRIL EVERY DAY   . Lactulose 20 GM/30ML SOLN Take 30 mLs (20 g total) by mouth as  needed. (Patient not taking: Reported on 12/21/2019)    No facility-administered encounter medications on file as of 12/21/2019.     Objective:   Goals Addressed              This Visit's Progress     Patient Stated   .  "I want to control my diabetes" (pt-stated)        CARE PLAN ENTRY (see longtitudinal plan of care for additional care plan information)  Current Barriers:  . Social, community, and financial barriers:  o Denies cost concerns at this time. 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 Wishes he could drive, but aware that his ophthalmologic disease limits this.  . Diabetes: controlled per A1c but uncontrolled per patient home BG reports; most recent A1c 5.9% . Most recent eGFR ~25 mL/min . Current antihyperglycemic regimen: Lantus 45 units daily BID, Novolog 1-10 units sliding scale with meals; follows w/ Dr. Kelton Pillar at [facility]  o Previously tried: Ozempic; significant GI upset o Renal fx limits metformin, SGLT2 . Current glucose readings:  o Reports "most" readings <180. Notes that d/t refill date and multiple sensors not working, patient has had a period that he has not been using DexCom. Reports that he does plan to restart soon. Acknowledges his sugars have not been well controlled d/t not monitoring as frequently . 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 Ubaldo Glassing, plans to ask about new order for cardiac rehab o Current hypertensive regimen: amlodipine 2.5 mg daily, lisinopril 5 mg daily, metoprolol tartrate 50 mg BID, hydralazine 25 mg TID; reports they have not checked recently, but home readings a few weeks ago were at goal <130/80 o Current hyperlipidemia regimen: atorvastatin 80 mg daily; LDL at goal <70 o Antiplatelet regimen: ASA 81 mg daily . Peripheral neuropathy: gabapentin 300 mg BID. Does report more "arthritis" pain in his hand. Swollen thumb joint after a day of work at  the computer typing. Wonders about OTC recommendations that are safe for his kidneys  Pharmacist Clinical Goal(s):  Marland Kitchen Over the next 90 days, patient will work with PharmD and primary care provider to address optimized glycemic benefit  Interventions: . Comprehensive medication review performed, medication list updated in electronic medical record . Inter-disciplinary care team collaboration (see longitudinal plan of care) . Reviewed upcoming endocrinology appt w/ Dr. Kelton Pillar. Advised to restart CGM to have ample data to allow Dr. Kelton Pillar to adjust regimen. He verbalized understanding. Wife requested refill on Lantus/Novolog. Sent for 30 day supply to tide over until endocrinology appt. . Praised for adherence to BP regimen and maintenance of goal BP. Agreed to discuss w/ Dr. Ubaldo Glassing regarding starting cardiac rehab.  . Recommend OTC acetaminophen BID, max daily dose 4000 mg. Discussed use of PRN topical Voltaren  as well. Advised that if no pain relief with these OTC options, consider appt w/ Dr. [name] to discuss other treatment options.   Patient Self Care Activities:  . Patient will check blood glucose up to QID w/ Dexcom , document, and provide at future appointment . Patient will take medications as prescribed . Patient will report any questions or concerns to provider   Please see past updates related to this goal by clicking on the "Past Updates" button in the selected goal          Plan:  - Scheduled f/u call in ~ 8-10 weeks (endocrinology f/u and likely cardiology f/u in the meantime)  Catie Darnelle Maffucci, PharmD, [place], [place] Pharmacist [place] [phone]

## 2019-12-21 NOTE — Patient Instructions (Signed)
Visit Information  Goals Addressed              This Visit's Progress     Patient Stated   .  "I want to control my diabetes" (pt-stated)        CARE PLAN ENTRY (see longtitudinal plan of care for additional care plan information)  Current Barriers:  . Social, community, and financial barriers:  o Denies cost concerns at this time. 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 Wishes he could drive, but aware that his ophthalmologic disease limits this.  . Diabetes: controlled per A1c but uncontrolled per patient home BG reports; most recent A1c 5.9% . Most recent eGFR ~25 mL/min . Current antihyperglycemic regimen: Lantus 45 units daily BID, Novolog 1-10 units sliding scale with meals; follows w/ Dr. Kelton Pillar at [facility]  o Previously tried: Ozempic; significant GI upset o Renal fx limits metformin, SGLT2 . Current glucose readings:  o Reports "most" readings <180. Notes that d/t refill date and multiple sensors not working, patient has had a period that he has not been using DexCom. Reports that he does plan to restart soon. Acknowledges his sugars have not been well controlled d/t not monitoring as frequently . 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 Ubaldo Glassing, plans to ask about new order for cardiac rehab o Current hypertensive regimen: amlodipine 2.5 mg daily, lisinopril 5 mg daily, metoprolol tartrate 50 mg BID, hydralazine 25 mg TID; reports they have not checked recently, but home readings a few weeks ago were at goal <130/80 o Current hyperlipidemia regimen: atorvastatin 80 mg daily; LDL at goal <70 o Antiplatelet regimen: ASA 81 mg daily . Peripheral neuropathy: gabapentin 300 mg BID. Does report more "arthritis" pain in his hand. Swollen thumb joint after a day of work at the computer typing. Wonders about OTC recommendations that are safe for his kidneys  Pharmacist Clinical Goal(s):  Marland Kitchen Over the  next 90 days, patient will work with PharmD and primary care provider to address optimized glycemic benefit  Interventions: . Comprehensive medication review performed, medication list updated in electronic medical record . Inter-disciplinary care team collaboration (see longitudinal plan of care) . Reviewed upcoming endocrinology appt w/ Dr. Kelton Pillar. Advised to restart CGM to have ample data to allow Dr. Kelton Pillar to adjust regimen. He verbalized understanding. Wife requested refill on Lantus/Novolog. Sent for 30 day supply to tide over until endocrinology appt. . Praised for adherence to BP regimen and maintenance of goal BP. Agreed to discuss w/ Dr. Ubaldo Glassing regarding starting cardiac rehab.  . Recommend OTC acetaminophen BID, max daily dose 4000 mg. Discussed use of PRN topical Voltaren as well. Advised that if no pain relief with these OTC options, consider appt w/ Dr. [name] to discuss other treatment options.   Patient Self Care Activities:  . Patient will check blood glucose up to QID w/ Dexcom , document, and provide at future appointment . Patient will take medications as prescribed . Patient will report any questions or concerns to provider   Please see past updates related to this goal by clicking on the "Past Updates" button in the selected goal         The patient verbalized understanding of instructions provided today and declined a print copy of patient instruction materials.    Plan:  - Scheduled f/u call in ~ 8-10 weeks (endocrinology f/u and likely cardiology f/u in the meantime)  Catie Darnelle Maffucci, PharmD,  BCACP, CPP Clinical Pharmacist [place] [phone]

## 2019-12-27 ENCOUNTER — Other Ambulatory Visit: Payer: Self-pay | Admitting: Family Medicine

## 2019-12-27 DIAGNOSIS — IMO0002 Reserved for concepts with insufficient information to code with codable children: Secondary | ICD-10-CM

## 2019-12-27 DIAGNOSIS — E1139 Type 2 diabetes mellitus with other diabetic ophthalmic complication: Secondary | ICD-10-CM

## 2020-01-09 ENCOUNTER — Ambulatory Visit: Payer: Managed Care, Other (non HMO) | Admitting: Pharmacist

## 2020-01-09 DIAGNOSIS — E11319 Type 2 diabetes mellitus with unspecified diabetic retinopathy without macular edema: Secondary | ICD-10-CM

## 2020-01-09 NOTE — Chronic Care Management (AMB) (Signed)
Chronic Care Management   Follow Up Note   01/09/2020 Name: Chase Clark MRN: [id number] DOB: 07/01/1965  Referred by: Chase Clark Reason for referral : Chronic Care Management (Medication Management)   Chase Clark is a 54 y.o. year old male who is a primary care patient of Chase Clark, Chase Adam, Clark. The CCM team was consulted for assistance with chronic disease management and care coordination needs.    Received call from patient's wife, Chase Clark, with medication access issues.  Review of patient status, including review of consultants reports, relevant laboratory and other test results, and collaboration with appropriate care team members and the patient's provider was performed as part of comprehensive patient evaluation and provision of chronic care management services.    SDOH (Social Determinants of Health) assessments performed: No See Care Plan activities for detailed interventions related to [facility])     Outpatient Encounter Medications as of 01/09/2020  Medication Sig  . acetaminophen (TYLENOL) 325 MG tablet Take 1 tablet (325 mg total) by mouth every 6 (six) hours as needed for mild pain (pain score 1-3 or temp > 100.5).  Marland Kitchen amLODipine (NORVASC) 5 MG tablet Take 2.5 mg by mouth daily.  Marland Kitchen aspirin (ASPIRIN CHILDRENS) 81 MG chewable tablet Chew 1 tablet (81 mg total) by mouth daily.  Marland Kitchen atorvastatin (LIPITOR) 80 MG tablet TAKE 1 TABLET BY MOUTH EVERY DAY  . brimonidine-timolol (COMBIGAN) 0.2-0.5 % ophthalmic solution Place 1 drop into the right eye every 12 (twelve) hours.   . cholecalciferol (VITAMIN D3) 25 MCG (1000 UT) tablet Take 1,000 Units by mouth daily.  . Continuous Blood Gluc Receiver (DEXCOM G6 RECEIVER) DEVI Use to check blood sugars at least 4 times daily  . Continuous Blood Gluc Sensor (DEXCOM G6 SENSOR) MISC Place a new sensor every 10 days to check blood sugar at least 4 times daily  . Continuous Blood Gluc Transmit (DEXCOM G6 TRANSMITTER) MISC Use to  check blood sugar at least 4 times daily  . dorzolamide (TRUSOPT) 2 % ophthalmic solution 1 drop 2 (two) times daily.  Marland Kitchen esomeprazole (NEXIUM) 20 MG capsule Take 20 mg by mouth daily.   . fexofenadine (ALLEGRA) 180 MG tablet Take 180 mg by mouth daily.  . fluticasone (FLONASE) 50 MCG/ACT nasal spray SPRAY 2 SPRAYS INTO EACH NOSTRIL EVERY DAY  . furosemide (LASIX) 40 MG tablet Take 40 mg by mouth daily as needed.  . gabapentin (NEURONTIN) 300 MG capsule Take 1 capsule (300 mg total) by mouth 2 (two) times daily.  . hydrALAZINE (APRESOLINE) 25 MG tablet Take 1 tablet (25 mg total) by mouth 3 (three) times daily.  . insulin aspart (NOVOLOG FLEXPEN) 100 UNIT/ML FlexPen Inject up to 15 units TID with meals. Further refills to come from endocrinology  . insulin glargine (LANTUS) 100 UNIT/ML Solostar Clark Inject 45 Units into the skin 2 (two) times daily. Further refills to come from endocrinology.  . Insulin Clark Needle 32G X 4 MM MISC Inject 5 pens into the skin daily. Use to inject Lantus and Novolog up to 5 times daily  . Lactulose 20 GM/30ML SOLN Take 30 mLs (20 g total) by mouth as needed. (Patient not taking: Reported on 12/21/2019)  . latanoprost (XALATAN) 0.005 % ophthalmic solution Place 1 drop into both eyes at bedtime.  Marland Kitchen lisinopril (ZESTRIL) 5 MG tablet Take 5 mg by mouth daily.  . metoprolol tartrate (LOPRESSOR) 100 MG tablet TAKE 0.5 TABLETS (50 MG TOTAL) BY MOUTH 2 (TWO) TIMES DAILY.  Marland Kitchen  senna (SENOKOT) 8.6 MG tablet Take 1 tablet by mouth daily.  . tamsulosin (FLOMAX) 0.4 MG CAPS capsule Take 1 capsule (0.4 mg total) by mouth daily.   No facility-administered encounter medications on file as of 01/09/2020.     Objective:   Goals Addressed              This Visit's Progress     Patient Stated   .  "I want to control my diabetes" (pt-stated)        CARE PLAN ENTRY (see longtitudinal plan of care for additional care plan information)  Current Barriers:  . Social, community, and  financial barriers:  o Wife calls today with medication access issues. Notes that she changed jobs, so insurance changed. Notes that she received rejections on Lantus and Novolog, is unsure what to do. . Diabetes: controlled per A1c but uncontrolled per patient home BG reports; most recent A1c 5.9%. . Most recent eGFR ~25 mL/min . Current antihyperglycemic regimen: Lantus 45 units daily BID, Novolog 1-10 units sliding scale with meals; follows w/ Chase Clark at [facility]  o Previously tried: Ozempic; significant GI upset o Renal fx limits metformin, SGLT2 . 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 Peripheral neuropathy: gabapentin 300 mg BID  Pharmacist Clinical Goal(s):  Marland Kitchen Over the next 90 days, patient will work with PharmD and primary care provider to address optimized glycemic benefit  Interventions: . Discussed that alternative basal/bolus insulins may be preferred on this new insurance plan. Chase Clark is going to call [employer] and call either me or Chase Clark office if new prescriptions are needed for alternative insulins.  Patient Self Care Activities:  . Patient will check blood glucose up to QID w/ Dexcom , document, and provide at future appointment . Patient will take medications as prescribed . Patient will report any questions or concerns to provider   Please see past updates related to this goal by clicking on the "Past Updates" button in the selected goal          Plan:  - Will collaborate w/ patient/wife as above - Will outreach as previously schedule  Catie Darnelle Maffucci, PharmD, [place], [place] Pharmacist [facility] [employer] [phone]

## 2020-01-09 NOTE — Patient Instructions (Signed)
Visit Information  Goals Addressed              This Visit's Progress     Patient Stated   .  "I want to control my diabetes" (pt-stated)        CARE PLAN ENTRY (see longtitudinal plan of care for additional care plan information)  Current Barriers:  . Social, community, and financial barriers:  o Wife calls today with medication access issues. Notes that she changed jobs, so insurance changed. Notes that she received rejections on Lantus and Novolog, is unsure what to do. . Diabetes: controlled per A1c but uncontrolled per patient home BG reports; most recent A1c 5.9%. . Most recent eGFR ~25 mL/min . Current antihyperglycemic regimen: Lantus 45 units daily BID, Novolog 1-10 units sliding scale with meals; follows w/ Dr. Shamleffer at [facility]  o Previously tried: Ozempic; significant GI upset o Renal fx limits metformin, SGLT2 . 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 Fath o Current hypertensive regimen: amlodipine 2.5 mg daily, lisinopril 5 mg daily, metoprolol tartrate 50 mg BID, hydralazine 25 mg TID o Current hyperlipidemia regimen: atorvastatin 80 mg daily; LDL at goal <70 o Antiplatelet regimen: ASA 81 mg daily . Peripheral neuropathy: gabapentin 300 mg BID  Pharmacist Clinical Goal(s):  . Over the next 90 days, patient will work with PharmD and primary care provider to address optimized glycemic benefit  Interventions: . Discussed that alternative basal/bolus insulins may be preferred on this new insurance plan. Terra is going to call [employer] and call either me or Dr. Shamleffer's office if new prescriptions are needed for alternative insulins.  Patient Self Care Activities:  . Patient will check blood glucose up to QID w/ Dexcom , document, and provide at future appointment . Patient will take medications as prescribed . Patient will report any questions or concerns to provider   Please see past updates  related to this goal by clicking on the "Past Updates" button in the selected goal         The patient verbalized understanding of instructions provided today and declined a print copy of patient instruction materials.   Plan:  - Will collaborate w/ patient/wife as above - Will outreach as previously schedule  Catie Travis, PharmD, BCACP, CPP Clinical Pharmacist [facility] [phone]  

## 2020-01-12 ENCOUNTER — Telehealth: Payer: Self-pay

## 2020-01-13 ENCOUNTER — Encounter: Payer: Self-pay | Admitting: Family Medicine

## 2020-01-13 DIAGNOSIS — N184 Chronic kidney disease, stage 4 (severe): Secondary | ICD-10-CM

## 2020-01-13 NOTE — Telephone Encounter (Signed)
Does he need refills?

## 2020-01-13 NOTE — Progress Notes (Signed)
Received call from [facility] noting that Lantus and Humalog were APPROVED by [employer]ella [employer]uermann.

## 2020-01-13 NOTE — Telephone Encounter (Signed)
Waiting for a response from [place] about appeal by fax.  Darothy Courtright,cma

## 2020-01-16 NOTE — Progress Notes (Signed)
Chase Clark did not request refills, no. I believe the last script I sent should carry them over to the endocrinology appointment later this month

## 2020-01-17 ENCOUNTER — Ambulatory Visit: Payer: Managed Care, Other (non HMO) | Admitting: Pharmacist

## 2020-01-17 DIAGNOSIS — E11319 Type 2 diabetes mellitus with unspecified diabetic retinopathy without macular edema: Secondary | ICD-10-CM

## 2020-01-17 DIAGNOSIS — Z794 Long term (current) use of insulin: Secondary | ICD-10-CM

## 2020-01-17 NOTE — Chronic Care Management (AMB) (Signed)
Chronic Care Management   Follow Up Note   01/17/2020 Name: Chase Clark MRN: [id number] DOB: 1965-10-19  Referred by: [name], MD Reason for referral : Chronic Care Management (Medication Management)   Chase Clark is a 54 y.o. year old male who is a primary care patient of [name], [name], MD. The CCM team was consulted for assistance with chronic disease management and care coordination needs.    Received call from patient's wife, [name], regarding medication access needs.   Review of patient status, including review of consultants reports, relevant laboratory and other test results, and collaboration with appropriate care team members and the patient's provider was performed as part of comprehensive patient evaluation and provision of chronic care management services.    SDOH (Social Determinants of Health) assessments performed: No See Care Plan activities for detailed interventions related to [facility])     Outpatient Encounter Medications as of 01/17/2020  Medication Sig  . acetaminophen (TYLENOL) 325 MG tablet Take 1 tablet (325 mg total) by mouth every 6 (six) hours as needed for mild pain (pain score 1-3 or temp > 100.5).  Marland Kitchen amLODipine (NORVASC) 5 MG tablet Take 2.5 mg by mouth daily.  Marland Kitchen aspirin (ASPIRIN CHILDRENS) 81 MG chewable tablet Chew 1 tablet (81 mg total) by mouth daily.  Marland Kitchen atorvastatin (LIPITOR) 80 MG tablet TAKE 1 TABLET BY MOUTH EVERY DAY  . brimonidine-timolol (COMBIGAN) 0.2-0.5 % ophthalmic solution Place 1 drop into the right eye every 12 (twelve) hours.   . cholecalciferol (VITAMIN D3) 25 MCG (1000 UT) tablet Take 1,000 Units by mouth daily.  . Continuous Blood Gluc Receiver (DEXCOM G6 RECEIVER) DEVI Use to check blood sugars at least 4 times daily  . Continuous Blood Gluc Sensor (DEXCOM G6 SENSOR) MISC Place a new sensor every 10 days to check blood sugar at least 4 times daily  . Continuous Blood Gluc Transmit (DEXCOM G6 TRANSMITTER) MISC Use to  check blood sugar at least 4 times daily  . dorzolamide (TRUSOPT) 2 % ophthalmic solution 1 drop 2 (two) times daily.  Marland Kitchen esomeprazole (NEXIUM) 20 MG capsule Take 20 mg by mouth daily.   . fexofenadine (ALLEGRA) 180 MG tablet Take 180 mg by mouth daily.  . fluticasone (FLONASE) 50 MCG/ACT nasal spray SPRAY 2 SPRAYS INTO EACH NOSTRIL EVERY DAY  . furosemide (LASIX) 40 MG tablet Take 40 mg by mouth daily as needed.  . gabapentin (NEURONTIN) 300 MG capsule Take 1 capsule (300 mg total) by mouth 2 (two) times daily.  . hydrALAZINE (APRESOLINE) 25 MG tablet Take 1 tablet (25 mg total) by mouth 3 (three) times daily.  . insulin aspart (NOVOLOG FLEXPEN) 100 UNIT/ML FlexPen Inject up to 15 units TID with meals. Further refills to come from endocrinology  . insulin glargine (LANTUS) 100 UNIT/ML Solostar Pen Inject 45 Units into the skin 2 (two) times daily. Further refills to come from endocrinology.  . Insulin Pen Needle 32G X 4 MM MISC Inject 5 pens into the skin daily. Use to inject Lantus and Novolog up to 5 times daily  . Lactulose 20 GM/30ML SOLN Take 30 mLs (20 g total) by mouth as needed. (Patient not taking: Reported on 12/21/2019)  . latanoprost (XALATAN) 0.005 % ophthalmic solution Place 1 drop into both eyes at bedtime.  Marland Kitchen lisinopril (ZESTRIL) 5 MG tablet Take 5 mg by mouth daily.  . metoprolol tartrate (LOPRESSOR) 100 MG tablet TAKE 0.5 TABLETS (50 MG TOTAL) BY MOUTH 2 (TWO) TIMES DAILY.  Marland Kitchen  senna (SENOKOT) 8.6 MG tablet Take 1 tablet by mouth daily.  . tamsulosin (FLOMAX) 0.4 MG CAPS capsule Take 1 capsule (0.4 mg total) by mouth daily.   No facility-administered encounter medications on file as of 01/17/2020.     Objective:   Goals Addressed              This Visit's Progress     Patient Stated   .  "I want to control my diabetes" (pt-stated)        CARE PLAN ENTRY (see longtitudinal plan of care for additional care plan information)  Current Barriers:  . Social, community, and  financial barriers:  o Follow up on medication access issues due to changing insurances. PA completed for Novolog, but PA still required for Lantus. Terra requests we attempt PA for current therapy rather than switching.  . Diabetes: controlled per A1c but uncontrolled per patient home BG reports; most recent A1c 5.9%. . Most recent eGFR ~25 mL/min . Current antihyperglycemic regimen: Lantus 45 units daily BID, Novolog 1-10 units sliding scale with meals; follows w/ Dr. Kelton Pillar at [facility] - follow up later this month o Previously tried: Ozempic; significant GI upset o Renal fx limits metformin, SGLT2 . 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 Ubaldo Glassing o Current hypertensive regimen: amlodipine 2.5 mg daily, lisinopril 5 mg daily, metoprolol tartrate 50 mg BID, hydralazine 25 mg TID o Current hyperlipidemia regimen: atorvastatin 80 mg daily; LDL at goal <70 o Antiplatelet regimen: ASA 81 mg daily . Peripheral neuropathy: gabapentin 300 mg BID - requests refill . CKD: patient requests referral for new nephrologist. Placed by PCP for [facility]  Pharmacist Clinical Goal(s):  Marland Kitchen Over the next 90 days, patient will work with PharmD and primary care provider to address optimized glycemic benefit  Interventions: . Completed PA for Lantus. If denied, will discuss switch to [facility] w/ Dr. Kelton Pillar. Requested quick review, as [name] notes patient is low on Lantus supply right now.  . Passed along gabapentin refill request to PCP. Last prescribed by CT surgery.  Marland Kitchen Notified [name] that patient is overdue for f/u with Dr. [name]. She notes she will call the office back this afternoon to schedule follow up   Patient Self Care Activities:  . Patient will check blood glucose up to QID w/ Dexcom , document, and provide at future appointment . Patient will take medications as prescribed . Patient will report any questions or  concerns to provider   Please see past updates related to this goal by clicking on the "Past Updates" button in the selected goal          Plan:  - Will outreach as previously scheduled  Catie Darnelle Maffucci, PharmD, [place], [place] Pharmacist [place] [phone]

## 2020-01-17 NOTE — Progress Notes (Signed)
Received a call from [place]. There is still an issue with the insurance covering patient's insulin; she is going to call Christella Scheuermann now and figure out what needs to be done.   She also requests a refill on gabapentin.

## 2020-01-17 NOTE — Patient Instructions (Signed)
Visit Information  Goals Addressed              This Visit's Progress     Patient Stated   .  "I want to control my diabetes" (pt-stated)        CARE PLAN ENTRY (see longtitudinal plan of care for additional care plan information)  Current Barriers:  . Social, community, and financial barriers:  o Follow up on medication access issues due to changing insurances. PA completed for Novolog, but PA still required for Lantus. Terra requests we attempt PA for current therapy rather than switching.  . Diabetes: controlled per A1c but uncontrolled per patient home BG reports; most recent A1c 5.9%. . Most recent eGFR ~25 mL/min . Current antihyperglycemic regimen: Lantus 45 units daily BID, Novolog 1-10 units sliding scale with meals; follows w/ Dr. Kelton Pillar at [facility] - follow up later this month o Previously tried: Ozempic; significant GI upset o Renal fx limits metformin, SGLT2 . 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 Ubaldo Glassing o Current hypertensive regimen: amlodipine 2.5 mg daily, lisinopril 5 mg daily, metoprolol tartrate 50 mg BID, hydralazine 25 mg TID o Current hyperlipidemia regimen: atorvastatin 80 mg daily; LDL at goal <70 o Antiplatelet regimen: ASA 81 mg daily . Peripheral neuropathy: gabapentin 300 mg BID - requests refill . CKD: patient requests referral for new nephrologist. Placed by PCP for [facility]  Pharmacist Clinical Goal(s):  Marland Kitchen Over the next 90 days, patient will work with PharmD and primary care provider to address optimized glycemic benefit  Interventions: . Completed PA for Lantus. If denied, will discuss switch to [facility] w/ Dr. Kelton Pillar. Requested quick review, as [name] notes patient is low on Lantus supply right now.   Patient Self Care Activities:  . Patient will check blood glucose up to QID w/ Dexcom , document, and provide at future appointment . Patient will take  medications as prescribed . Patient will report any questions or concerns to provider   Please see past updates related to this goal by clicking on the "Past Updates" button in the selected goal         The patient verbalized understanding of instructions provided today and declined a print copy of patient instruction materials.   Plan:  - Will outreach as previously scheduled  Catie Darnelle Maffucci, PharmD, [place], [place] Pharmacist [place] [phone]

## 2020-01-19 ENCOUNTER — Other Ambulatory Visit: Payer: Self-pay | Admitting: Family Medicine

## 2020-01-19 ENCOUNTER — Telehealth: Payer: Self-pay | Admitting: Family Medicine

## 2020-01-19 MED ORDER — GABAPENTIN 300 MG PO CAPS: 300.0000 mg | ORAL_CAPSULE | Freq: Two times a day (BID) | ORAL | 6 refills | Status: DC

## 2020-01-19 NOTE — Telephone Encounter (Signed)
Patient dropped off handicapp renewal form. Form is up front in Dr. Ellen Henri color folder.

## 2020-01-20 ENCOUNTER — Ambulatory Visit: Payer: Self-pay | Admitting: Internal Medicine

## 2020-01-20 NOTE — Telephone Encounter (Signed)
[employer] form was dropped off and placed in sign basket.  Lajune Perine,cma

## 2020-01-20 NOTE — Telephone Encounter (Signed)
Please complete a new handicap placard form. I can not sign this one as it includes a checked box regarding blindness that needs to be complete by an ophthalmologist. Please find out what his vision is like and see if he is still driving. If he meets the criteria for that box to be checked he should not be driving.

## 2020-01-24 ENCOUNTER — Ambulatory Visit: Payer: Managed Care, Other (non HMO) | Admitting: Pharmacist

## 2020-01-24 DIAGNOSIS — E103593 Type 1 diabetes mellitus with proliferative diabetic retinopathy without macular edema, bilateral: Secondary | ICD-10-CM

## 2020-01-24 MED ORDER — BASAGLAR KWIKPEN 100 UNIT/ML ~~LOC~~ SOPN: 45.0000 [IU] | PEN_INJECTOR | Freq: Every day | SUBCUTANEOUS | 1 refills | Status: DC

## 2020-01-24 NOTE — Telephone Encounter (Signed)
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  Geneviene Tesch,cma

## 2020-01-24 NOTE — Chronic Care Management (AMB) (Signed)
Chronic Care Management   Follow Up Note   01/24/2020 Name: Chase Clark MRN: [id number] DOB: [birth year]  Referred by: [name], MD Reason for referral : Chronic Care Management (Medication Management)   Chase Clark is a 54 y.o. year old male who is a primary care patient of [name], [name], MD. The CCM team was consulted for assistance with chronic disease management and care coordination needs.    Follow up on medication management/access.  Review of patient status, including review of consultants reports, relevant laboratory and other test results, and collaboration with appropriate care team members and the patient's provider was performed as part of comprehensive patient evaluation and provision of chronic care management services.    SDOH (Social Determinants of Health) assessments performed: No See Care Plan activities for detailed interventions related to [facility])     Outpatient Encounter Medications as of 01/24/2020  Medication Sig  . acetaminophen (TYLENOL) 325 MG tablet Take 1 tablet (325 mg total) by mouth every 6 (six) hours as needed for mild pain (pain score 1-3 or temp > 100.5).  Marland Kitchen amLODipine (NORVASC) 5 MG tablet Take 2.5 mg by mouth daily.  Marland Kitchen aspirin (ASPIRIN CHILDRENS) 81 MG chewable tablet Chew 1 tablet (81 mg total) by mouth daily.  Marland Kitchen atorvastatin (LIPITOR) 80 MG tablet TAKE 1 TABLET BY MOUTH EVERY DAY  . brimonidine-timolol (COMBIGAN) 0.2-0.5 % ophthalmic solution Place 1 drop into the right eye every 12 (twelve) hours.   . cholecalciferol (VITAMIN D3) 25 MCG (1000 UT) tablet Take 1,000 Units by mouth daily.  . Continuous Blood Gluc Receiver (DEXCOM G6 RECEIVER) DEVI Use to check blood sugars at least 4 times daily  . Continuous Blood Gluc Sensor (DEXCOM G6 SENSOR) MISC Place a new sensor every 10 days to check blood sugar at least 4 times daily  . Continuous Blood Gluc Transmit (DEXCOM G6 TRANSMITTER) MISC Use to check blood sugar at least 4  times daily  . dorzolamide (TRUSOPT) 2 % ophthalmic solution 1 drop 2 (two) times daily.  Marland Kitchen esomeprazole (NEXIUM) 20 MG capsule Take 20 mg by mouth daily.   . fexofenadine (ALLEGRA) 180 MG tablet Take 180 mg by mouth daily.  . fluticasone (FLONASE) 50 MCG/ACT nasal spray SPRAY 2 SPRAYS INTO EACH NOSTRIL EVERY DAY  . furosemide (LASIX) 40 MG tablet Take 40 mg by mouth daily as needed.  . gabapentin (NEURONTIN) 300 MG capsule Take 1 capsule (300 mg total) by mouth 2 (two) times daily.  . hydrALAZINE (APRESOLINE) 25 MG tablet Take 1 tablet (25 mg total) by mouth 3 (three) times daily.  . insulin aspart (NOVOLOG FLEXPEN) 100 UNIT/ML FlexPen Inject up to 15 units TID with meals. Further refills to come from endocrinology  . Insulin Glargine (BASAGLAR KWIKPEN) 100 UNIT/ML Inject 45 Units into the skin daily. Further refills to come from endocrinology  . Insulin Pen Needle 32G X 4 MM MISC Inject 5 pens into the skin daily. Use to inject Lantus and Novolog up to 5 times daily  . Lactulose 20 GM/30ML SOLN Take 30 mLs (20 g total) by mouth as needed. (Patient not taking: Reported on 12/21/2019)  . latanoprost (XALATAN) 0.005 % ophthalmic solution Place 1 drop into both eyes at bedtime.  Marland Kitchen lisinopril (ZESTRIL) 5 MG tablet Take 5 mg by mouth daily.  . metoprolol tartrate (LOPRESSOR) 100 MG tablet TAKE 0.5 TABLETS (50 MG TOTAL) BY MOUTH 2 (TWO) TIMES DAILY.  Marland Kitchen senna (SENOKOT) 8.6 MG tablet Take 1 tablet by  mouth daily.  . tamsulosin (FLOMAX) 0.4 MG CAPS capsule Take 1 capsule (0.4 mg total) by mouth daily.  . [DISCONTINUED] insulin glargine (LANTUS) 100 UNIT/ML Solostar Pen Inject 45 Units into the skin 2 (two) times daily. Further refills to come from endocrinology.   No facility-administered encounter medications on file as of 01/24/2020.     Objective:   Goals Addressed              This Visit's Progress     Patient Stated   .  "I want to control my diabetes" (pt-stated)        CARE PLAN ENTRY  (see longtitudinal plan of care for additional care plan information)  Current Barriers:  . Social, community, and financial barriers:  o PA for Lantus denied.  . Diabetes: controlled per A1c but uncontrolled per patient home BG reports; most recent A1c 5.9%. . Most recent eGFR ~25 mL/min . Current antihyperglycemic regimen: Lantus 45 units daily BID, Novolog 1-10 units sliding scale with meals; follows w/ Dr. Kelton Pillar at [facility] - follow up later this month o Previously tried: Ozempic; significant GI upset o Renal fx limits metformin, SGLT2 . 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 Ubaldo Glassing o Current hypertensive regimen: amlodipine 2.5 mg daily, lisinopril 5 mg daily, metoprolol tartrate 50 mg BID, hydralazine 25 mg TID o Current hyperlipidemia regimen: atorvastatin 80 mg daily; LDL at goal <70 o Antiplatelet regimen: ASA 81 mg daily . Peripheral neuropathy: gabapentin 300 mg BID - requests refill . CKD: patient requests referral for new nephrologist. Placed by PCP for [facility]  Pharmacist Clinical Goal(s):  Marland Kitchen Over the next 90 days, patient will work with PharmD and primary care provider to address optimized glycemic benefit  Interventions: . Basaglar preferred. D/c Lantus, start Basaglar 45 units twice daily. Notified patient's wife, [name], of pharmacokinetic similarity between Lantus and [employer], [employer]. Reviewed that future refills should come from endocrinology, reviewed upcoming appointment. Terra verbalized understanding  Patient Self Care Activities:  . Patient will check blood glucose up to QID w/ Dexcom , document, and provide at future appointment . Patient will take medications as prescribed . Patient will report any questions or concerns to provider   Please see past updates related to this goal by clicking on the "Past Updates" button in the selected goal          Plan:  - Will outreach as  previously scheduled  Catie Darnelle Maffucci, PharmD, [place], [place] Pharmacist [place] [phone]

## 2020-01-24 NOTE — Telephone Encounter (Signed)
Patient aware that handicap placard placed upfront to pick up.

## 2020-01-24 NOTE — Telephone Encounter (Signed)
Noted. Please fill out a new form that does not include the ophthalmology statement as I am not an ophthalmologist and then I can sign it.

## 2020-01-24 NOTE — Patient Instructions (Signed)
Visit Information  Goals Addressed              This Visit's Progress     Patient Stated   .  "I want to control my diabetes" (pt-stated)        CARE PLAN ENTRY (see longtitudinal plan of care for additional care plan information)  Current Barriers:  . Social, community, and financial barriers:  o PA for Lantus denied.  . Diabetes: controlled per A1c but uncontrolled per patient home BG reports; most recent A1c 5.9%. . Most recent eGFR ~25 mL/min . Current antihyperglycemic regimen: Lantus 45 units daily BID, Novolog 1-10 units sliding scale with meals; follows w/ Dr. Kelton Pillar at [facility] - follow up later this month o Previously tried: Ozempic; significant GI upset o Renal fx limits metformin, SGLT2 . 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 Ubaldo Glassing o Current hypertensive regimen: amlodipine 2.5 mg daily, lisinopril 5 mg daily, metoprolol tartrate 50 mg BID, hydralazine 25 mg TID o Current hyperlipidemia regimen: atorvastatin 80 mg daily; LDL at goal <70 o Antiplatelet regimen: ASA 81 mg daily . Peripheral neuropathy: gabapentin 300 mg BID - requests refill . CKD: patient requests referral for new nephrologist. Placed by PCP for [facility]  Pharmacist Clinical Goal(s):  Marland Kitchen Over the next 90 days, patient will work with PharmD and primary care provider to address optimized glycemic benefit  Interventions: . Basaglar preferred. D/c Lantus, start Basaglar 45 units twice daily. Notified patient's wife, [name], of pharmacokinetic similarity between Lantus and [employer], [employer]. Reviewed that future refills should come from endocrinology, reviewed upcoming appointment. Terra verbalized understanding  Patient Self Care Activities:  . Patient will check blood glucose up to QID w/ Dexcom , document, and provide at future appointment . Patient will take medications as prescribed . Patient will report any questions or  concerns to provider   Please see past updates related to this goal by clicking on the "Past Updates" button in the selected goal         The patient verbalized understanding of instructions provided today and declined a print copy of patient instruction materials.   Plan:  - Will outreach as previously scheduled  Catie Darnelle Maffucci, PharmD, [place], [place] Pharmacist [place] [phone]

## 2020-01-25 ENCOUNTER — Other Ambulatory Visit: Payer: Self-pay | Admitting: Family Medicine

## 2020-01-26 ENCOUNTER — Ambulatory Visit: Payer: Managed Care, Other (non HMO) | Admitting: Pharmacist

## 2020-01-26 DIAGNOSIS — E103593 Type 1 diabetes mellitus with proliferative diabetic retinopathy without macular edema, bilateral: Secondary | ICD-10-CM

## 2020-01-26 MED ORDER — INSULIN LISPRO (1 UNIT DIAL) 100 UNIT/ML (KWIKPEN): PEN_INJECTOR | SUBCUTANEOUS | 0 refills | Status: DC

## 2020-01-26 NOTE — Chronic Care Management (AMB) (Signed)
Chronic Care Management   Follow Up Note   01/26/2020 Name: CAMILLE THAU MRN: [id number] DOB: 02-18-1966  Referred by: [name], MD Reason for referral : Chronic Care Management (Medication Management)   KHANH TANORI is a 54 y.o. year old male who is a primary care patient of [name], [name], MD. The CCM team was consulted for assistance with chronic disease management and care coordination needs.    Received call from patient's wife, [name], regarding medication access issues.   Review of patient status, including review of consultants reports, relevant laboratory and other test results, and collaboration with appropriate care team members and the patient's provider was performed as part of comprehensive patient evaluation and provision of chronic care management services.    SDOH (Social Determinants of Health) assessments performed: No See Care Plan activities for detailed interventions related to [facility])     Outpatient Encounter Medications as of 01/26/2020  Medication Sig  . acetaminophen (TYLENOL) 325 MG tablet Take 1 tablet (325 mg total) by mouth every 6 (six) hours as needed for mild pain (pain score 1-3 or temp > 100.5).  Marland Kitchen amLODipine (NORVASC) 5 MG tablet Take 2.5 mg by mouth daily.  Marland Kitchen aspirin (ASPIRIN CHILDRENS) 81 MG chewable tablet Chew 1 tablet (81 mg total) by mouth daily.  Marland Kitchen atorvastatin (LIPITOR) 80 MG tablet TAKE 1 TABLET BY MOUTH EVERY DAY  . brimonidine-timolol (COMBIGAN) 0.2-0.5 % ophthalmic solution Place 1 drop into the right eye every 12 (twelve) hours.   . cholecalciferol (VITAMIN D3) 25 MCG (1000 UT) tablet Take 1,000 Units by mouth daily.  . Continuous Blood Gluc Receiver (DEXCOM G6 RECEIVER) DEVI Use to check blood sugars at least 4 times daily  . Continuous Blood Gluc Sensor (DEXCOM G6 SENSOR) MISC Place a new sensor every 10 days to check blood sugar at least 4 times daily  . Continuous Blood Gluc Transmit (DEXCOM G6 TRANSMITTER) MISC Use  to check blood sugar at least 4 times daily  . dorzolamide (TRUSOPT) 2 % ophthalmic solution 1 drop 2 (two) times daily.  Marland Kitchen esomeprazole (NEXIUM) 20 MG capsule Take 20 mg by mouth daily.   . fexofenadine (ALLEGRA) 180 MG tablet Take 180 mg by mouth daily.  . fluticasone (FLONASE) 50 MCG/ACT nasal spray SPRAY 2 SPRAYS INTO EACH NOSTRIL EVERY DAY  . furosemide (LASIX) 40 MG tablet Take 40 mg by mouth daily as needed.  . gabapentin (NEURONTIN) 300 MG capsule Take 1 capsule (300 mg total) by mouth 2 (two) times daily.  . hydrALAZINE (APRESOLINE) 25 MG tablet Take 1 tablet (25 mg total) by mouth 3 (three) times daily.  . Insulin Glargine (BASAGLAR KWIKPEN) 100 UNIT/ML Inject 45 Units into the skin daily. Further refills to come from endocrinology  . insulin lispro (HUMALOG KWIKPEN) 100 UNIT/ML KwikPen Inject up to 15 units TID with meals. Further refills to come from endocrinology  . Insulin Pen Needle 32G X 4 MM MISC Inject 5 pens into the skin daily. Use to inject Lantus and Novolog up to 5 times daily  . Lactulose 20 GM/30ML SOLN Take 30 mLs (20 g total) by mouth as needed. (Patient not taking: Reported on 12/21/2019)  . latanoprost (XALATAN) 0.005 % ophthalmic solution Place 1 drop into both eyes at bedtime.  Marland Kitchen lisinopril (ZESTRIL) 5 MG tablet Take 5 mg by mouth daily.  . metoprolol tartrate (LOPRESSOR) 100 MG tablet TAKE 0.5 TABLETS (50 MG TOTAL) BY MOUTH 2 (TWO) TIMES DAILY.  Marland Kitchen senna (SENOKOT) 8.6  MG tablet Take 1 tablet by mouth daily.  . tamsulosin (FLOMAX) 0.4 MG CAPS capsule Take 1 capsule (0.4 mg total) by mouth daily.  . [DISCONTINUED] insulin aspart (NOVOLOG FLEXPEN) 100 UNIT/ML FlexPen Inject up to 15 units TID with meals. Further refills to come from endocrinology   No facility-administered encounter medications on file as of 01/26/2020.     Objective:   Goals Addressed              This Visit's Progress     Patient Stated   .  "I want to control my diabetes" (pt-stated)          CARE PLAN ENTRY (see longtitudinal plan of care for additional care plan information)  Current Barriers:  . Social, community, and financial barriers: o [name] calls today noting that Novolog is not running through at the pharmacy . Diabetes: controlled per A1c but uncontrolled per patient home BG reports; most recent A1c 5.9%. . Most recent eGFR ~25 mL/min . Current antihyperglycemic regimen: Basaglar 45 units daily BID, Novolog 1-10 units sliding scale with meals; follows w/ Dr. Kelton Pillar at [facility] - follow up later this month o Previously tried: Ozempic; significant GI upset o Renal fx limits metformin, SGLT2 . 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 Ubaldo Glassing o Current hypertensive regimen: amlodipine 2.5 mg daily, lisinopril 5 mg daily, metoprolol tartrate 50 mg BID, hydralazine 25 mg TID o Current hyperlipidemia regimen: atorvastatin 80 mg daily; LDL at goal <70 o Antiplatelet regimen: ASA 81 mg daily . Peripheral neuropathy: gabapentin 300 mg BID - requests refill . CKD: patient requests referral for new nephrologist. Placed by PCP for [facility]  Pharmacist Clinical Goal(s):  Marland Kitchen Over the next 90 days, patient will work with PharmD and primary care provider to address optimized glycemic benefit  Interventions: . Humalog preferred. PA for Novolog will not be approved if there is not documented intolerance to Humalog. Switching to Humalog. Patient's wife notified.   Patient Self Care Activities:  . Patient will check blood glucose up to QID w/ Dexcom , document, and provide at future appointment . Patient will take medications as prescribed . Patient will report any questions or concerns to provider   Please see past updates related to this goal by clicking on the "Past Updates" button in the selected goal          Plan:  - Will outreach as previously scheduled  Catie Darnelle Maffucci, PharmD, [place],  [place] Pharmacist [place] [phone]

## 2020-01-26 NOTE — Patient Instructions (Signed)
Visit Information  Goals Addressed              This Visit's Progress     Patient Stated   .  "I want to control my diabetes" (pt-stated)        CARE PLAN ENTRY (see longtitudinal plan of care for additional care plan information)  Current Barriers:  . Social, community, and financial barriers: o [name] calls today noting that Novolog is not running through at the pharmacy . Diabetes: controlled per A1c but uncontrolled per patient home BG reports; most recent A1c 5.9%. . Most recent eGFR ~25 mL/min . Current antihyperglycemic regimen: Basaglar 45 units daily BID, Novolog 1-10 units sliding scale with meals; follows w/ Dr. Kelton Pillar at [facility] Endocrinology - follow up later this month o Previously tried: Ozempic; significant GI upset o Renal fx limits metformin, SGLT2 . 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 Ubaldo Glassing o Current hypertensive regimen: amlodipine 2.5 mg daily, lisinopril 5 mg daily, metoprolol tartrate 50 mg BID, hydralazine 25 mg TID o Current hyperlipidemia regimen: atorvastatin 80 mg daily; LDL at goal <70 o Antiplatelet regimen: ASA 81 mg daily . Peripheral neuropathy: gabapentin 300 mg BID - requests refill . CKD: patient requests referral for new nephrologist. Placed by PCP for [facility]  Pharmacist Clinical Goal(s):  Marland Kitchen Over the next 90 days, patient will work with PharmD and primary care provider to address optimized glycemic benefit  Interventions: . Humalog preferred. PA for Novolog will not be approved if there is not documented intolerance to Humalog. Switching to Humalog. Patient's wife notified.   Patient Self Care Activities:  . Patient will check blood glucose up to QID w/ Dexcom , document, and provide at future appointment . Patient will take medications as prescribed . Patient will report any questions or concerns to provider   Please see past updates related to this goal by clicking  on the "Past Updates" button in the selected goal         The patient verbalized understanding of instructions provided today and declined a print copy of patient instruction materials.   Plan:  - Will outreach as previously scheduled  Catie Darnelle Maffucci, PharmD, [place], [place] Pharmacist [place] [phone]

## 2020-02-02 ENCOUNTER — Encounter: Payer: Self-pay | Admitting: Internal Medicine

## 2020-02-07 ENCOUNTER — Ambulatory Visit (INDEPENDENT_AMBULATORY_CARE_PROVIDER_SITE_OTHER): Payer: BC Managed Care – PPO | Admitting: Vascular Surgery

## 2020-02-08 ENCOUNTER — Telehealth: Payer: Self-pay | Admitting: Internal Medicine

## 2020-02-09 ENCOUNTER — Ambulatory Visit: Payer: Self-pay | Admitting: Internal Medicine

## 2020-02-09 NOTE — Telephone Encounter (Signed)
NA

## 2020-02-10 ENCOUNTER — Other Ambulatory Visit: Payer: Self-pay

## 2020-02-10 ENCOUNTER — Encounter: Payer: Managed Care, Other (non HMO) | Attending: Cardiology | Admitting: *Deleted

## 2020-02-10 DIAGNOSIS — Z951 Presence of aortocoronary bypass graft: Secondary | ICD-10-CM

## 2020-02-10 NOTE — Progress Notes (Signed)
Initial telephone orientation completed. Diagnosis can be found in [facility] 10/7. EP orientation scheduled for 11/2 at 8am

## 2020-02-13 MED ORDER — INSULIN LISPRO (1 UNIT DIAL) 100 UNIT/ML (KWIKPEN): PEN_INJECTOR | SUBCUTANEOUS | 0 refills | Status: DC

## 2020-02-13 NOTE — Addendum Note (Signed)
Addended by: De Hollingshead on: 02/13/2020 09:21 AM   Modules accepted: Orders

## 2020-02-20 ENCOUNTER — Encounter: Payer: Self-pay | Admitting: Family Medicine

## 2020-02-20 ENCOUNTER — Ambulatory Visit: Payer: Managed Care, Other (non HMO)

## 2020-02-22 ENCOUNTER — Telehealth: Payer: Self-pay | Admitting: Internal Medicine

## 2020-02-22 ENCOUNTER — Encounter: Payer: Self-pay | Admitting: Internal Medicine

## 2020-02-22 ENCOUNTER — Other Ambulatory Visit: Payer: Self-pay

## 2020-02-22 ENCOUNTER — Ambulatory Visit: Payer: Managed Care, Other (non HMO) | Admitting: Internal Medicine

## 2020-02-22 VITALS — BP 140/82 | HR 66 | Ht 70.0 in | Wt 237.5 lb

## 2020-02-22 DIAGNOSIS — E1139 Type 2 diabetes mellitus with other diabetic ophthalmic complication: Secondary | ICD-10-CM

## 2020-02-22 DIAGNOSIS — E1042 Type 1 diabetes mellitus with diabetic polyneuropathy: Secondary | ICD-10-CM

## 2020-02-22 DIAGNOSIS — E1022 Type 1 diabetes mellitus with diabetic chronic kidney disease: Secondary | ICD-10-CM

## 2020-02-22 DIAGNOSIS — E103593 Type 1 diabetes mellitus with proliferative diabetic retinopathy without macular edema, bilateral: Secondary | ICD-10-CM

## 2020-02-22 DIAGNOSIS — Z89512 Acquired absence of left leg below knee: Secondary | ICD-10-CM

## 2020-02-22 DIAGNOSIS — E1165 Type 2 diabetes mellitus with hyperglycemia: Secondary | ICD-10-CM

## 2020-02-22 DIAGNOSIS — E1059 Type 1 diabetes mellitus with other circulatory complications: Secondary | ICD-10-CM

## 2020-02-22 DIAGNOSIS — N184 Chronic kidney disease, stage 4 (severe): Secondary | ICD-10-CM

## 2020-02-22 DIAGNOSIS — IMO0002 Reserved for concepts with insufficient information to code with codable children: Secondary | ICD-10-CM

## 2020-02-22 LAB — POCT GLUCOSE (DEVICE FOR HOME USE): Glucose Fasting, POC: 127 mg/dL — AB (ref 70–99)

## 2020-02-22 LAB — POCT GLYCOSYLATED HEMOGLOBIN (HGB A1C): Hemoglobin A1C: 9 % — AB (ref 4.0–5.6)

## 2020-02-22 MED ORDER — INSULIN LISPRO (1 UNIT DIAL) 100 UNIT/ML (KWIKPEN): 6.0000 [IU] | PEN_INJECTOR | Freq: Three times a day (TID) | SUBCUTANEOUS | 2 refills | Status: DC

## 2020-02-22 MED ORDER — BASAGLAR KWIKPEN 100 UNIT/ML ~~LOC~~ SOPN: 40.0000 [IU] | PEN_INJECTOR | Freq: Two times a day (BID) | SUBCUTANEOUS | 3 refills | Status: DC

## 2020-02-22 MED ORDER — INSULIN PEN NEEDLE 32G X 4 MM MISC: 5.0000 "pen " | Freq: Every day | 1 refills | Status: DC

## 2020-02-22 MED ORDER — DEXCOM G6 SENSOR MISC: 1.0000 | 3 refills | Status: DC

## 2020-02-22 MED ORDER — DEXCOM G6 TRANSMITTER MISC: 1.0000 | 3 refills | Status: DC

## 2020-02-22 NOTE — Progress Notes (Signed)
Name: Chase Clark  Age/ Sex: 54 y.o., male   MRN/ DOB: [id number], 1966/01/03     PCP: [name], MD   Reason for Endocrinology Evaluation: Type 1 Diabetes Mellitus  Initial Endocrine Consultative Visit: 06/29/2019    PATIENT IDENTIFIER: [name] is a 54 y.o. male with a past medical history of T1DM, HTN, CAD (S/P DES 2011). The patient has followed with Endocrinology clinic since 06/29/2019 for consultative assistance with management of his diabetes.  DIABETIC HISTORY:  [name] was diagnosed with  T1DM in 1990, was initially diagnosed as type 2 until years ago when he was diagnosed with T1DM, has been on insulin for ~ 15 yrs. He tried Ozempic 04/2019 with GI intolerance. His hemoglobin A1c has ranged from 5.9%  in 2021, peaking at 15.3%in 2019.   SUBJECTIVE:   During the last visit (06/29/2019): A1c 5.9 %. Adjusted MDi regimen   Today (02/22/2020): [name] is here for a follow up on diabetes management. He has not been here in 8 months.  He is accompanied by his wife today.He has been having issues with Dexcom sensor and unable to use a finger stick due to vision impairment.  They also changed insurance and had issues with insulin coverage   Has GI issues is planning on seeing Dr. 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 Arthritis   . Asthma    as child  . Coronary artery disease    DES  DIAG1 11/20/09 [facility])  . Diabetes mellitus without complication ([place])   . Diabetic osteomyelitis ([place])   . Diabetic ulcer of foot with muscle involvement without evidence of necrosis ([place])    DIABETIC ULCERATIONS ASSOCIATED WITH IRRITATION LATERAL ANKLE LEFT GREATER THAN RIGHT WITH MILD CELLULITIS   . Diverticulosis   . DKA (diabetic ketoacidoses) 08/23/2017  . Edema, lower extremity   . Fatty liver   . GERD (gastroesophageal reflux disease)   . Gilbert's disease   . Glaucoma   . Heart disease 2011   Patient has stent for 80% blockage  . Hyperlipidemia   . Hypertension   . Neuropathy   . Osteomyelitis of left foot ([place])   . Seasonal allergies   . Shoulder pain   . Ulcer of foot (Chase Clark) 08.06.2014   DIABETIC ULCERATIONS ASSOCIATED WITH IRRITATION LATERAL ANKLE LEFT GREATER THAN RIGHT WITH MILD CELLULITIS   Past Surgical History:  Past Surgical History:  Procedure Laterality Date  . ABDOMINAL AORTOGRAM N/A 05/20/2016   Procedure: Abdominal Aortogram possible intervention;  Surgeon: Katha Cabal, MD;  Location: [place] [facility];  Service: Cardiovascular;  Laterality: N/A;  . AMPUTATION Left 09/05/2017   Procedure: AMPUTATION BELOW KNEE;  Surgeon: Tania Ade, MD;  Location: [place];  Service: Orthopedics;  Laterality: Left;  . ANTERIOR CERVICAL DECOMP/DISCECTOMY FUSION N/A 05/07/2017  Procedure: ANTERIOR CERVICAL DECOMPRESSION/DISCECTOMY L[place]ia Maxin PROSTHESIS,PLATE/SCREWS CERVICAL FIVE - CERVICAL SIX;  Surgeon: Newman Pies, MD;  Location: [place];  Service: Neurosurgery;  Laterality: N/A;  . CARDIAC CATHETERIZATION    . CATARACT EXTRACTION W/ INTRAOCULAR LENS IMPLANT    . CATARACT EXTRACTION W/PHACO Left 02/26/2017   Procedure: CATARACT EXTRACTION PHACO AND INTRAOCULAR LENS PLACEMENT (IOC);  Surgeon: Eulogio Bear, MD;  Location: [facility];  Service: Ophthalmology;  Laterality: Left;  Lot # [id number] H [place]: 00:25.3 AP%: 6.2 CDE: 1.59   . CHOLECYSTECTOMY N/A   .  CORONARY ANGIOPLASTY WITH STENT PLACEMENT  2007   1st diagonal  . CORONARY ARTERY BYPASS GRAFT N/A 01/27/2019   Procedure: OFF PUMP CORONARY ARTERY BYPASS GRAFTING (CABG) X 2 WITH ENDOSCOPIC HARVESTING OF RIGHT GREATER SAPHENOUS VEIN. LIMA TO LAD;  Surgeon: Lajuana Matte, MD;  Location: [place];  Service: Open Heart Surgery;  Laterality: N/A;  . CORONARY STENT INTERVENTION N/A 01/21/2019   Procedure: CORONARY STENT INTERVENTION;  Surgeon: Belva Crome, MD;  Location: [place] [facility];  Service: Cardiovascular;  Laterality: N/A;  . EPIBLEPHERON REPAIR WITH TEAR DUCT PROBING    . EYE SURGERY Right sept 29n2015   cataract extraction  . INSERTION EXPRESS TUBE SHUNT Right 09/06/2015   Procedure: INSERTION AHMED TUBE SHUNT with tutoplast allograft;  Surgeon: Eulogio Bear, MD;  Location: [facility];  Service: Ophthalmology;  Laterality: Right;  . LEFT HEART CATH AND CORONARY ANGIOGRAPHY N/A 01/20/2019   Procedure: LEFT HEART CATH AND CORONARY ANGIOGRAPHY;  Surgeon: Lorretta Harp, MD;  Location: [place] [facility];  Service: Cardiovascular;  Laterality: N/A;  . TEE WITHOUT CARDIOVERSION N/A 01/27/2019   Procedure: TRANSESOPHAGEAL ECHOCARDIOGRAM (TEE);  Surgeon: Lajuana Matte, MD;  Location: [place];  Service: Open Heart Surgery;  Laterality: N/A;  . VASECTOMY      Social History:  reports that he quit smoking about 11 years ago. His smoking use included cigarettes. He has never used smokeless tobacco. He reports current alcohol use. He reports that he does not use drugs. Family History:  Family History  Problem Relation Age of Onset  . Hyperlipidemia Mother   . Diabetes Mother   . Liver disease Mother        NASH  . Hyperlipidemia Father   . Diabetes Father   . Heart disease Father   . Hypertension Father      HOME MEDICATIONS: Allergies as of 02/22/2020      Reactions   Ozempic (0.25 Or 0.5 Mg-dose) [semaglutide(0.25 Or 0.5mg -dos)] Nausea And Vomiting, Other (See  Comments)   Triggered occular migraines      Medication List       Accurate as of February 22, 2020  7:48 AM. If you have any questions, ask your nurse or doctor.        STOP taking these medications   amLODipine 5 MG tablet Commonly known as: NORVASC Stopped by: Dorita Sciara, MD   fexofenadine 180 MG tablet Commonly known as: ALLEGRA Stopped by: Dorita Sciara, MD   tamsulosin 0.4 MG Caps capsule Commonly known as: FLOMAX Stopped by: Dorita Sciara, MD     TAKE these medications   acetaminophen 325 MG tablet Commonly known as: TYLENOL Take 1 tablet (325 mg total) by mouth every 6 (six) hours as needed for mild pain (pain score 1-3 or temp > 100.5).   aspirin 81 MG chewable tablet Commonly known as: Aspirin Childrens Chew 1 tablet (81 mg total) by mouth  daily.   atorvastatin 80 MG tablet Commonly known as: LIPITOR TAKE 1 TABLET BY MOUTH EVERY DAY   Basaglar KwikPen 100 UNIT/ML Inject 45 Units into the skin daily. Further refills to come from endocrinology   cholecalciferol 25 MCG (1000 UNIT) tablet Commonly known as: VITAMIN D3 Take 1,000 Units by mouth daily.   Combigan 0.2-0.5 % ophthalmic solution Generic drug: brimonidine-timolol Place 1 drop into the right eye every 12 (twelve) hours.   Dexcom G6 Receiver Devi Use to check blood sugars at least 4 times daily   Dexcom G6 Sensor Misc Place a new sensor every 10 days to check blood sugar at least 4 times daily   Dexcom G6 Transmitter Misc Use to check blood sugar at least 4 times daily   dorzolamide 2 % ophthalmic solution Commonly known as: TRUSOPT 1 drop 2 (two) times daily.   esomeprazole 20 MG capsule Commonly known as: NEXIUM Take 20 mg by mouth daily.   fluticasone 50 MCG/ACT nasal spray Commonly known as: FLONASE SPRAY 2 SPRAYS INTO EACH NOSTRIL EVERY DAY   furosemide 40 MG tablet Commonly known as: LASIX Take 40 mg by mouth daily as needed.   gabapentin 300 MG  capsule Commonly known as: NEURONTIN Take 1 capsule (300 mg total) by mouth 2 (two) times daily.   hydrALAZINE 25 MG tablet Commonly known as: APRESOLINE Take by mouth. What changed: Another medication with the same name was removed. Continue taking this medication, and follow the directions you see here. Changed by: Dorita Sciara, MD   insulin lispro 100 UNIT/ML KwikPen Commonly known as: HumaLOG KwikPen Inject up to 15 units TID with meals. Further refills to come from endocrinology   Insulin Pen Needle 32G X 4 MM Misc Inject 5 pens into the skin daily. Use to inject Lantus and Novolog up to 5 times daily   Lactulose 20 GM/30ML Soln Take 30 mLs (20 g total) by mouth as needed.   latanoprost 0.005 % ophthalmic solution Commonly known as: XALATAN Place 1 drop into both eyes at bedtime.   lisinopril 5 MG tablet Commonly known as: ZESTRIL Take 5 mg by mouth daily.   metoprolol tartrate 100 MG tablet Commonly known as: LOPRESSOR TAKE 0.5 TABLETS (50 MG TOTAL) BY MOUTH 2 (TWO) TIMES DAILY.   senna 8.6 MG tablet Commonly known as: SENOKOT Take 1 tablet by mouth daily.        OBJECTIVE:   Vital Signs: BP 140/82   Pulse 66   Ht 5\' 10"  (1.778 m)   Wt 237 lb 8 oz (107.7 kg)   SpO2 98%   BMI 34.08 kg/m   Wt Readings from Last 3 Encounters:  02/22/20 237 lb 8 oz (107.7 kg)  07/12/19 226 lb (102.5 kg)  06/29/19 229 lb (103.9 kg)     Exam: General: Pt appears well and is in NAD  Lungs: Clear with good BS bilat with no rales, rhonchi, or wheezes  Heart: RRR with normal S1 and S2 and no gallops; no murmurs; no rub  Abdomen: Normoactive bowel sounds, soft, nontender, without masses or organomegaly palpable  Extremities: No pretibial edema. No tremor. Normal strength and motion throughout. See detailed diabetic foot exam below.  Neuro: MS is good with appropriate affect, pt is alert and Ox3      DM foot exam: 06/29/2019  Right foot exam , prosthetic on the left   The skin of the feet is intact without sores or ulcerations. The pedal pulses are 1+ on  right  The sensation is decreased  to a screening 5.07, 10 gram monofilament on the right     DATA REVIEWED:  Lab Results  Component Value Date   HGBA1C 9.0 (A) 02/22/2020   HGBA1C 5.9 05/24/2019   HGBA1C 9.3 (H) 01/19/2019   Lab Results  Component Value Date   MICROALBUR 13.5 (H) 01/06/2014   LDLCALC 55 05/24/2019   CREATININE 2.70 (H) 05/24/2019   Lab Results  Component Value Date   MICRALBCREAT 15.5 01/06/2014     Lab Results  Component Value Date   CHOL 126 05/24/2019   HDL 31.70 (L) 05/24/2019   LDLCALC 55 05/24/2019   LDLDIRECT 130.0 12/17/2018   TRIG 199.0 (H) 05/24/2019   CHOLHDL 4 05/24/2019         ASSESSMENT / PLAN / RECOMMENDATIONS:   1)Type 1 Diabetes Mellitus, Poorly controlled, With  CKD IV, Hx of left BKA, retinopathy and macrovascular complications - Most recent A1c of 9.0 %. Goal A1c < 7.5 %.    - Worsening A1c  - There was a period of time where he wasn't checking glucose due to malfunctioning sensors.  - We were unable to open an account for Dexcom on last visit as he was unable to read the code, today we sent info to wife to initiate the process .  - He continues to use too much basal insulin , but he had a hypoglycemic episode last night,6.5 hrs after last dose of humalog, this is most likely due to basal insulin, will reduce as below - I am not going to change his prandial dose due to lack of glucose data - He is not using correction scale due to visual impairment  - I offered to send a prescription for a talking meter but he is unable to prick finger due to visual impairment  - Insulin and sensors refilled today   MEDICATIONS: -Decrease Basaglar to 40  units twice a day  -Humalog 6 units with each meal   EDUCATION / INSTRUCTIONS:  BG monitoring instructions: Patient is instructed to check his blood sugars 4 times a day, before meals and bedtime    Call [facility] if: BG persistently < 70  . I reviewed the Rule of 15 for the treatment of hypoglycemia in detail with the patient. Literature supplied.   2) Diabetic complications:   Eye: Does  have known diabetic retinopathy.   Neuro/ Feet: Does have known diabetic peripheral neuropathy .   Renal: Patient does  have known baseline CKD. He   is on an ACEI/ARB at present.       F/U in 6 months    Signed electronically by: Mack Guise, MD  [facility] End[facility]  [address] [place], [place] [address] Phone: [phone] FAX: [phone]   CC: [name], MD [address] [place] [address] Phone: [phone]  Fax: [phone]  Return to Endocrinology clinic as below: Future Appointments  Date Time Provider [place]  02/23/2020  8:00 AM [facility] None  02/29/2020  2:15 PM [facility]CM PHARMACIST [facility]  03/02/2020  9:30 AM [name], MD [facility]  03/15/2020  9:50 AM Pyrtle, Lajuan Lines, MD [facility]

## 2020-02-22 NOTE — Patient Instructions (Addendum)
-  Decrease Basaglar to 40  units twice a day  -Humalog 6 units with each meal     HOW TO TREAT LOW BLOOD SUGARS (Blood sugar LESS THAN 70 MG/DL)  Please follow the RULE OF 15 for the treatment of hypoglycemia treatment (when your (blood sugars are less than 70 mg/dL)    STEP 1: Take 15 grams of carbohydrates when your blood sugar is low, which includes:   3-4 GLUCOSE TABS  OR  3-4 OZ OF JUICE OR REGULAR SODA OR  ONE TUBE OF GLUCOSE GEL     STEP 2: RECHECK blood sugar in 15 MINUTES STEP 3: If your blood sugar is still low at the 15 minute recheck --> then, go back to STEP 1 and treat AGAIN with another 15 grams of carbohydrates.

## 2020-02-22 NOTE — Telephone Encounter (Signed)
Prior Authorization for Dexcom G6 Sensor was completed on 02/22/2020. Waiting response from the insurance.

## 2020-02-23 ENCOUNTER — Encounter: Payer: Managed Care, Other (non HMO) | Attending: Cardiology | Admitting: *Deleted

## 2020-02-23 VITALS — Ht 71.0 in | Wt 231.1 lb

## 2020-02-23 DIAGNOSIS — Z951 Presence of aortocoronary bypass graft: Secondary | ICD-10-CM | POA: Insufficient documentation

## 2020-02-23 NOTE — Patient Instructions (Signed)
Patient Instructions  Patient Details  Name: Chase Clark MRN: [id number] Date of Birth: 1966/03/31 Referring Provider:  Teodoro Spray, MD  Below are your personal goals for exercise, nutrition, and risk factors. Our goal is to help you stay on track towards obtaining and maintaining these goals. We will be discussing your progress on these goals with you throughout the program.  Initial Exercise Prescription:  Initial Exercise Prescription - 02/23/20 1000      Date of Initial Exercise RX and Referring Provider   Date 02/23/20    Referring Provider Bartholome Bill MD      Treadmill   MPH 2    Grade 0.5    Minutes 15    METs 2.67      NuStep   Level 2    SPM 80    Minutes 15    METs 2      T5 Nustep   Level 2    SPM 80    Minutes 15    METs 2      Biostep-RELP   Level 2    SPM 50    Minutes 15    METs 2      Prescription Details   Frequency (times per week) 3    Duration Progress to 30 minutes of continuous aerobic without signs/symptoms of physical distress      Intensity   THRR 40-80% of Max Heartrate 108-145    Ratings of Perceived Exertion 11-13    Perceived Dyspnea 0-4      Progression   Progression Continue to progress workloads to maintain intensity without signs/symptoms of physical distress.      Resistance Training   Training Prescription Yes    Weight 3 lb    Reps 10-15           Exercise Goals: Frequency: Be able to perform aerobic exercise two to three times per week in program working toward 2-5 days per week of home exercise.  Intensity: Work with a perceived exertion of 11 (fairly light) - 15 (hard) while following your exercise prescription.  We will make changes to your prescription with you as you progress through the program.   Duration: Be able to do 30 to 45 minutes of continuous aerobic exercise in addition to a 5 minute warm-up and a 5 minute cool-down routine.   Nutrition Goals: Your personal nutrition goals will be  established when you do your nutrition analysis with the dietician.  The following are general nutrition guidelines to follow: Cholesterol < 200mg /day Sodium < 1500mg /day Fiber: Men over 50 yrs - 30 grams per day  Personal Goals:  Personal Goals and Risk Factors at Admission - 02/23/20 1025      Core Components/Risk Factors/Patient Goals on Admission    Weight Management Yes;Weight Loss    Intervention Weight Management: Develop a combined nutrition and exercise program designed to reach desired caloric intake, while maintaining appropriate intake of nutrient and fiber, sodium and fats, and appropriate energy expenditure required for the weight goal.;Weight Management: Provide education and appropriate resources to help participant work on and attain dietary goals.;Weight Management/Obesity: Establish reasonable short term and long term weight goals.;Obesity: Provide education and appropriate resources to help participant work on and attain dietary goals.    Admit Weight 231 lb 1.6 oz (104.8 kg)    Goal Weight: Short Term 225 lb (102.1 kg)    Goal Weight: Long Term 220 lb (99.8 kg)    Expected Outcomes Short Term: Continue to  assess and modify interventions until short term weight is achieved;Long Term: Adherence to nutrition and physical activity/exercise program aimed toward attainment of established weight goal;Understanding recommendations for meals to include 15-35% energy as protein, 25-35% energy from fat, 35-60% energy from carbohydrates, less than 200mg  of dietary cholesterol, 20-35 gm of total fiber daily;Weight Loss: Understanding of general recommendations for a balanced deficit meal plan, which promotes 1-2 lb weight loss per week and includes a negative energy balance of [id number] kcal/d;Understanding of distribution of calorie intake throughout the day with the consumption of 4-5 meals/snacks    Diabetes Yes    Intervention Provide education about signs/symptoms and action to take for  hypo/hyperglycemia.;Provide education about proper nutrition, including hydration, and aerobic/resistive exercise prescription along with prescribed medications to achieve blood glucose in normal ranges: Fasting glucose 65-99 mg/dL    Expected Outcomes Short Term: Participant verbalizes understanding of the signs/symptoms and immediate care of hyper/hypoglycemia, proper foot care and importance of medication, aerobic/resistive exercise and nutrition plan for blood glucose control.;Long Term: Attainment of HbA1C < 7%.    Hypertension Yes    Intervention Provide education on lifestyle modifcations including regular physical activity/exercise, weight management, moderate sodium restriction and increased consumption of fresh fruit, vegetables, and low fat dairy, alcohol moderation, and smoking cessation.;Monitor prescription use compliance.    Expected Outcomes Short Term: Continued assessment and intervention until BP is < 140/34mm HG in hypertensive participants. < 130/56mm HG in hypertensive participants with diabetes, heart failure or chronic kidney disease.;Long Term: Maintenance of blood pressure at goal levels.    Lipids Yes    Intervention Provide education and support for participant on nutrition & aerobic/resistive exercise along with prescribed medications to achieve LDL 70mg , HDL >40mg .    Expected Outcomes Short Term: Participant states understanding of desired cholesterol values and is compliant with medications prescribed. Participant is following exercise prescription and nutrition guidelines.;Long Term: Cholesterol controlled with medications as prescribed, with individualized exercise RX and with personalized nutrition plan. Value goals: LDL < 70mg , HDL > 40 mg.           Tobacco Use Initial Evaluation: Social History   Tobacco Use  Smoking Status Former Smoker  . Types: Cigarettes  . Quit date: 03/14/2008  . Years since quitting: 11.9  Smokeless Tobacco Never Used    Exercise  Goals and Review:  Exercise Goals    Row Name 02/23/20 1018             Exercise Goals   Increase Physical Activity Yes       Intervention Provide advice, education, support and counseling about physical activity/exercise needs.;Develop an individualized exercise prescription for aerobic and resistive training based on initial evaluation findings, risk stratification, comorbidities and participant's personal goals.       Expected Outcomes Short Term: Attend rehab on a regular basis to increase amount of physical activity.;Long Term: Add in home exercise to make exercise part of routine and to increase amount of physical activity.;Long Term: Exercising regularly at least 3-5 days a week.       Increase Strength and Stamina Yes       Intervention Provide advice, education, support and counseling about physical activity/exercise needs.;Develop an individualized exercise prescription for aerobic and resistive training based on initial evaluation findings, risk stratification, comorbidities and participant's personal goals.       Expected Outcomes Short Term: Increase workloads from initial exercise prescription for resistance, speed, and METs.;Short Term: Perform resistance training exercises routinely during rehab and add in resistance  training at home;Long Term: Improve cardiorespiratory fitness, muscular endurance and strength as measured by increased METs and functional capacity (6MWT)       Able to understand and use rate of perceived exertion (RPE) scale Yes       Intervention Provide education and explanation on how to use RPE scale       Expected Outcomes Short Term: Able to use RPE daily in rehab to express subjective intensity level;Long Term:  Able to use RPE to guide intensity level when exercising independently       Able to understand and use Dyspnea scale Yes       Intervention Provide education and explanation on how to use Dyspnea scale       Expected Outcomes Short Term: Able to use  Dyspnea scale daily in rehab to express subjective sense of shortness of breath during exertion;Long Term: Able to use Dyspnea scale to guide intensity level when exercising independently       Knowledge and understanding of Target Heart Rate Range (THRR) Yes       Intervention Provide education and explanation of THRR including how the numbers were predicted and where they are located for reference       Expected Outcomes Short Term: Able to state/look up THRR;Short Term: Able to use daily as guideline for intensity in rehab;Long Term: Able to use THRR to govern intensity when exercising independently       Able to check pulse independently Yes       Intervention Provide education and demonstration on how to check pulse in carotid and radial arteries.;Review the importance of being able to check your own pulse for safety during independent exercise       Expected Outcomes Short Term: Able to explain why pulse checking is important during independent exercise;Long Term: Able to check pulse independently and accurately       Understanding of Exercise Prescription Yes       Intervention Provide education, explanation, and written materials on patient's individual exercise prescription       Expected Outcomes Short Term: Able to explain program exercise prescription;Long Term: Able to explain home exercise prescription to exercise independently              Copy of goals given to participant.

## 2020-02-23 NOTE — Progress Notes (Signed)
Cardiac Individual Treatment Plan  Patient Details  Name: Chase Clark MRN: [id number] Date of Birth: Jun 13, 1965 Referring Provider:     Cardiac Rehab from 02/23/2020 in [facility] Cardiac and Pulmonary Rehab  Referring Provider Bartholome Bill MD      Initial Encounter Date:    Cardiac Rehab from 02/23/2020 in [facility] Cardiac and Pulmonary Rehab  Date 02/23/20      Visit Diagnosis: S/P CABG x 2  Patient's Home Medications on Admission:  Current Outpatient Medications:  .  acetaminophen (TYLENOL) 325 MG tablet, Take 1 tablet (325 mg total) by mouth every 6 (six) hours as needed for mild pain (pain score 1-3 or temp > 100.5)., Disp: 30 tablet, Rfl: 0 .  aspirin (ASPIRIN CHILDRENS) 81 MG chewable tablet, Chew 1 tablet (81 mg total) by mouth daily., Disp: 30 tablet, Rfl: 1 .  atorvastatin (LIPITOR) 80 MG tablet, TAKE 1 TABLET BY MOUTH EVERY DAY, Disp: 90 tablet, Rfl: 1 .  brimonidine-timolol (COMBIGAN) 0.2-0.5 % ophthalmic solution, Place 1 drop into the right eye every 12 (twelve) hours. , Disp: , Rfl:  .  cholecalciferol (VITAMIN D3) 25 MCG (1000 UT) tablet, Take 1,000 Units by mouth daily., Disp: , Rfl:  .  Continuous Blood Gluc Sensor (DEXCOM G6 SENSOR) MISC, Inject 1 Device into the skin as directed. Place a new sensor every 10 days to check blood sugar at least 4 times daily, Disp: 9 each, Rfl: 3 .  Continuous Blood Gluc Transmit (DEXCOM G6 TRANSMITTER) MISC, Inject 1 Device into the skin as directed. Use to check blood sugar at least 4 times daily, Disp: 1 each, Rfl: 3 .  dorzolamide (TRUSOPT) 2 % ophthalmic solution, 1 drop 2 (two) times daily., Disp: , Rfl:  .  esomeprazole (NEXIUM) 20 MG capsule, Take 20 mg by mouth daily. , Disp: , Rfl:  .  fluticasone (FLONASE) 50 MCG/ACT nasal spray, SPRAY 2 SPRAYS INTO EACH NOSTRIL EVERY DAY, Disp: 48 mL, Rfl: 2 .  furosemide (LASIX) 40 MG tablet, Take 40 mg by mouth daily as needed., Disp: , Rfl:  .  gabapentin (NEURONTIN) 300 MG capsule, Take 1  capsule (300 mg total) by mouth 2 (two) times daily., Disp: 60 capsule, Rfl: 6 .  hydrALAZINE (APRESOLINE) 25 MG tablet, Take by mouth., Disp: , Rfl:  .  Insulin Glargine (BASAGLAR KWIKPEN) 100 UNIT/ML, Inject 40 Units into the skin 2 (two) times daily. Further refills to come from endocrinology, Disp: 75 mL, Rfl: 3 .  insulin lispro (HUMALOG KWIKPEN) 100 UNIT/ML KwikPen, Inject 6 Units into the skin 3 (three) times daily. Inject up to 15 units TID with meals. Further refills to come from endocrinology, Disp: 30 mL, Rfl: 2 .  Insulin Pen Needle 32G X 4 MM MISC, Inject 5 pens into the skin daily. Use to inject Lantus and Novolog up to 5 times daily, Disp: 500 each, Rfl: 1 .  Lactulose 20 GM/30ML SOLN, Take 30 mLs (20 g total) by mouth as needed., Disp: 450 mL, Rfl: 1 .  latanoprost (XALATAN) 0.005 % ophthalmic solution, Place 1 drop into both eyes at bedtime., Disp: , Rfl: 5 .  lisinopril (ZESTRIL) 5 MG tablet, Take 5 mg by mouth daily., Disp: , Rfl:  .  metoprolol tartrate (LOPRESSOR) 100 MG tablet, TAKE 0.5 TABLETS (50 MG TOTAL) BY MOUTH 2 (TWO) TIMES DAILY., Disp: 90 tablet, Rfl: 1 .  senna (SENOKOT) 8.6 MG tablet, Take 1 tablet by mouth daily. , Disp: , Rfl:   Past Medical History:  Past Medical History:  Diagnosis Date  . Arthritis   . Asthma    as child  . Coronary artery disease    DES DIAG1 11/20/09 [facility])  . Diabetes mellitus without complication ([place])   . Diabetic osteomyelitis ([place])   . Diabetic ulcer of foot with muscle involvement without evidence of necrosis ([place])    DIABETIC ULCERATIONS ASSOCIATED WITH IRRITATION LATERAL ANKLE LEFT GREATER THAN RIGHT WITH MILD CELLULITIS   . Diverticulosis   . DKA (diabetic ketoacidoses) 08/23/2017  . Edema, lower extremity   . Fatty liver   . GERD (gastroesophageal reflux disease)   . Gilbert's disease   . Glaucoma   . Heart disease 2011   Patient has stent for 80% blockage  . Hyperlipidemia   . Hypertension   . Neuropathy   .  Osteomyelitis of left foot ([place])   . Seasonal allergies   . Shoulder pain   . Ulcer of foot ([place]) 08.06.2014   DIABETIC ULCERATIONS ASSOCIATED WITH IRRITATION LATERAL ANKLE LEFT GREATER THAN RIGHT WITH MILD CELLULITIS    Tobacco Use: Social History   Tobacco Use  Smoking Status Former Smoker  . Types: Cigarettes  . Quit date: 03/14/2008  . Years since quitting: 11.9  Smokeless Tobacco Never Used    Labs: Recent Chemical engineer    Labs for ITP Cardiac and Pulmonary Rehab Latest Ref Rng & Units 01/27/2019 01/27/2019 01/27/2019 05/24/2019 02/22/2020   Cholestrol 0 - 200 mg/dL - - - 126 -   LDLCALC 0 - 99 mg/dL - - - 55 -   LDLDIRECT mg/dL - - - - -   HDL >39.00 mg/dL - - - 31.70(L) -   Trlycerides 0 - 149 mg/dL - - - 199.0(H) -   Hemoglobin A1c 4.0 - 5.6 % - - - 5.9 9.0(A)   PHART 7.35 - 7.45 7.412 7.356 7.330(L) - -   PCO2ART 32 - 48 mmHg 36.1 42.9 44.0 - -   HCO3 20.0 - 28.0 mmol/L 23.5 24.2 23.3 - -   TCO2 22 - 32 mmol/L 25 26 25  - -   ACIDBASEDEF 0.0 - 2.0 mmol/L 1.0 1.0 3.0(H) - -   O2SAT % 99.0 98.0 93.0 - -       Exercise Target Goals: Exercise Program Goal: Individual exercise prescription set using results from initial 6 min walk test and THRR while considering  patient's activity barriers and safety.   Exercise Prescription Goal: Initial exercise prescription builds to 30-45 minutes a day of aerobic activity, 2-3 days per week.  Home exercise guidelines will be given to patient during program as part of exercise prescription that the participant will acknowledge.   Education: Aerobic Exercise: - Group verbal and visual presentation on the components of exercise prescription. Introduces F.I.T.T principle from ACSM for exercise prescriptions.  Reviews F.I.T.T. principles of aerobic exercise including progression. Written material given at graduation.   Education: Resistance Exercise: - Group verbal and visual presentation on the components of exercise  prescription. Introduces F.I.T.T principle from ACSM for exercise prescriptions  Reviews F.I.T.T. principles of resistance exercise including progression. Written material given at graduation.    Education: Exercise & Equipment Safety: - Individual verbal instruction and demonstration of equipment use and safety with use of the equipment.   Cardiac Rehab from 02/23/2020 in [facility] Cardiac and Pulmonary Rehab  Date 02/23/20  Educator [facility]  Instruction Review Code 1- Verbalizes Understanding      Education: Exercise Physiology & General Exercise Guidelines: - Group verbal and  written instruction with models to review the exercise physiology of the cardiovascular system and associated critical values. Provides general exercise guidelines with specific guidelines to those with heart or lung disease.    Cardiac Rehab from 02/23/2020 in [facility] Cardiac and Pulmonary Rehab  Education need identified 02/23/20      Education: Flexibility, Balance, Mind/Body Relaxation: - Group verbal and visual presentation with interactive activity on the components of exercise prescription. Introduces F.I.T.T principle from ACSM for exercise prescriptions. Reviews F.I.T.T. principles of flexibility and balance exercise training including progression. Also discusses the mind body connection.  Reviews various relaxation techniques to help reduce and manage stress (i.e. Deep breathing, progressive muscle relaxation, and visualization). Balance handout provided to take home. Written material given at graduation.   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 HR 86 bpm     Max Ex. BP 174/76     2 Minute Post BP 128/64            Oxygen Initial Assessment:   Oxygen Re-Evaluation:   Oxygen Discharge (Final Oxygen Re-Evaluation):   Initial Exercise Prescription:  Initial Exercise Prescription - 02/23/20 1000      Date of Initial Exercise RX and Referring Provider   Date 02/23/20    Referring Provider Bartholome Bill MD      Treadmill   MPH 2    Grade 0.5    Minutes 15    METs 2.67      NuStep   Level 2    SPM 80    Minutes 15    METs 2      T5 Nustep   Level 2    SPM 80    Minutes 15    METs 2      Biostep-RELP   Level 2    SPM 50    Minutes 15    METs 2      Prescription Details   Frequency (times per week) 3    Duration Progress to 30 minutes of continuous aerobic without signs/symptoms of physical distress      Intensity   THRR 40-80% of Max Heartrate 108-145    Ratings of Perceived Exertion 11-13    Perceived Dyspnea 0-4      Progression  Progression Continue to progress workloads to maintain intensity without signs/symptoms of physical distress.      Resistance Training   Training Prescription Yes    Weight 3 lb    Reps 10-15           Perform Capillary Blood Glucose checks as needed.  Exercise Prescription Changes:  Exercise Prescription Changes    Row Name 02/23/20 1000             Response to Exercise   Blood Pressure (Admit) 126/64       Blood Pressure (Exercise) 174/76        Blood Pressure (Exit) 128/74       Heart Rate (Admit) 70 bpm       Heart Rate (Exercise) 86 bpm       Heart Rate (Exit) 69 bpm       Oxygen Saturation (Admit) 99 %       Oxygen Saturation (Exercise) 98 %       Rating of Perceived Exertion (Exercise) 13       Perceived Dyspnea (Exercise) 2       Symptoms hips sore (2/10), fatigue, SOB       Comments walk test results              Exercise Comments:   Exercise Goals and Review:  Exercise Goals    Row Name 02/23/20 1018             Exercise Goals   Increase Physical Activity Yes       Intervention Provide advice, education, support and counseling about physical activity/exercise needs.;Develop an individualized exercise prescription for aerobic and resistive training based on initial evaluation findings, risk stratification, comorbidities and participant's personal goals.       Expected Outcomes Short Term: Attend rehab on a regular basis to increase amount of physical activity.;Long Term: Add in home exercise to make exercise part of routine and to increase amount of physical activity.;Long Term: Exercising regularly at least 3-5 days a week.       Increase Strength and Stamina Yes       Intervention Provide advice, education, support and counseling about physical activity/exercise needs.;Develop an individualized exercise prescription for aerobic and resistive training based on initial evaluation findings, risk stratification, comorbidities and participant's personal goals.       Expected Outcomes Short Term: Increase workloads from initial exercise prescription for resistance, speed, and METs.;Short Term: Perform resistance training exercises routinely during rehab and add in resistance training at home;Long Term: Improve cardiorespiratory fitness, muscular endurance and strength as measured by increased METs and functional capacity (6MWT)       Able to understand and use rate of perceived exertion (RPE) scale Yes       Intervention  Provide education and explanation on how to use RPE scale       Expected Outcomes Short Term: Able to use RPE daily in rehab to express subjective intensity level;Long Term:  Able to use RPE to guide intensity level when exercising independently       Able to understand and use Dyspnea scale Yes       Intervention Provide education and explanation on how to use Dyspnea scale       Expected Outcomes Short Term: Able to use Dyspnea scale daily in rehab to express subjective sense of shortness of breath during exertion;Long Term: Able to use Dyspnea scale to guide intensity level when exercising independently       Knowledge and  understanding of Target Heart Rate Range (THRR) Yes       Intervention Provide education and explanation of THRR including how the numbers were predicted and where they are located for reference       Expected Outcomes Short Term: Able to state/look up THRR;Short Term: Able to use daily as guideline for intensity in rehab;Long Term: Able to use THRR to govern intensity when exercising independently       Able to check pulse independently Yes       Intervention Provide education and demonstration on how to check pulse in carotid and radial arteries.;Review the importance of being able to check your own pulse for safety during independent exercise       Expected Outcomes Short Term: Able to explain why pulse checking is important during independent exercise;Long Term: Able to check pulse independently and accurately       Understanding of Exercise Prescription Yes       Intervention Provide education, explanation, and written materials on patient's individual exercise prescription       Expected Outcomes Short Term: Able to explain program exercise prescription;Long Term: Able to explain home exercise prescription to exercise independently              Exercise Goals Re-Evaluation :   Discharge Exercise Prescription (Final Exercise Prescription Changes):  Exercise  Prescription Changes - 02/23/20 1000      Response to Exercise   Blood Pressure (Admit) 126/64    Blood Pressure (Exercise) 174/76    Blood Pressure (Exit) 128/74    Heart Rate (Admit) 70 bpm    Heart Rate (Exercise) 86 bpm    Heart Rate (Exit) 69 bpm    Oxygen Saturation (Admit) 99 %    Oxygen Saturation (Exercise) 98 %    Rating of Perceived Exertion (Exercise) 13    Perceived Dyspnea (Exercise) 2    Symptoms hips sore (2/10), fatigue, SOB    Comments walk test results           Nutrition:  Target Goals: Understanding of nutrition guidelines, daily intake of sodium 1500mg , cholesterol 200mg , calories 30% from fat and 7% or less from saturated fats, daily to have 5 or more servings of fruits and vegetables.  Education: All About Nutrition: -Group instruction provided by verbal, written material, interactive activities, discussions, models, and posters to present general guidelines for heart healthy nutrition including fat, fiber, MyPlate, the role of sodium in heart healthy nutrition, utilization of the nutrition label, and utilization of this knowledge for meal planning. Follow up email sent as well. Written material given at graduation.   Cardiac Rehab from 02/23/2020 in [facility] Cardiac and Pulmonary Rehab  Education need identified 02/23/20      Biometrics:  Pre Biometrics - 02/23/20 1019      Pre Biometrics   Height 5\' 11"  (1.803 m)    Weight 231 lb 1.6 oz (104.8 kg)    BMI (Calculated) 32.25    Single Leg Stand 0.5 seconds   only did good leg           Nutrition Therapy Plan and Nutrition Goals:  Nutrition Therapy & Goals - 02/23/20 1025      Intervention Plan   Intervention Prescribe, educate and counsel regarding individualized specific dietary modifications aiming towards targeted core components such as weight, hypertension, lipid management, diabetes, heart failure and other comorbidities.    Expected Outcomes Short Term Goal: Understand basic principles of  dietary content, such as calories, fat, sodium, cholesterol  and nutrients.;Short Term Goal: A plan has been developed with personal nutrition goals set during dietitian appointment.;Long Term Goal: Adherence to prescribed nutrition plan.           Nutrition Assessments:  Nutrition Assessments - 02/23/20 1023      MEDFICTS Scores   Pre Score 39           MEDIFICTS Score Key:          ?70 Need to make dietary changes          40-70 Heart Healthy Diet         ? 40 Therapeutic Level Cholesterol Diet  Nutrition Goals Re-Evaluation:   Nutrition Goals Discharge (Final Nutrition Goals Re-Evaluation):   Psychosocial: Target Goals: Acknowledge presence or absence of significant depression and/or stress, maximize coping skills, provide positive support system. Participant is able to verbalize types and ability to use techniques and skills needed for reducing stress and depression.   Education: Stress, Anxiety, and Depression - Group verbal and visual presentation to define topics covered.  Reviews how body is impacted by stress, anxiety, and depression.  Also discusses healthy ways to reduce stress and to treat/manage anxiety and depression.  Written material given at graduation.   Education: Sleep Hygiene -Provides group verbal and written instruction about how sleep can affect your health.  Define sleep hygiene, discuss sleep cycles and impact of sleep habits. Review good sleep hygiene tips.    Initial Review & Psychosocial Screening:  Initial Psych Review & Screening - 02/10/20 1018      Initial Review   Current issues with Current Stress Concerns    Source of Stress Concerns Chronic Illness;Occupation;Unable to participate in former interests or hobbies;Unable to perform yard/household activities      [place]? Yes   wife     Barriers   Psychosocial barriers to participate in program There are no identifiable barriers or psychosocial needs.;The  patient should benefit from training in stress management and relaxation.      Screening Interventions   Interventions Encouraged to exercise;Provide feedback about the scores to participant;To provide support and resources with identified psychosocial needs    Expected Outcomes Short Term goal: Utilizing psychosocial counselor, staff and physician to assist with identification of specific Stressors or current issues interfering with healing process. 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  A difference of  2-3 points is a clinically meaningful difference.  A difference of 2-3 points in the total score of the Quality of Life Index has been associated with significant improvement in overall quality of life, self-image, physical symptoms, and general health in studies assessing change in quality of life.  PHQ-9: Recent Review Flowsheet Data  Depression screen [facility] 2/9 02/23/2020 02/22/2019 11/19/2018 12/28/2017 09/25/2017   Decreased Interest 0 0 0 0 0   Down, Depressed, Hopeless 0 0 0 0 0   PHQ - 2 Score 0 0 0 0 0   Altered sleeping 0 - - - -   Tired, decreased energy 2 - - - -   Change in appetite 0 - - - -   Feeling bad or failure about yourself  0 - - - -   Trouble concentrating 0 - - - -   Moving slowly or fidgety/restless 0 - - - -    Suicidal thoughts 0 - - - -   PHQ-9 Score 2 - - - -   Difficult doing work/chores Not difficult at all - - - -     Interpretation of Total Score  Total Score Depression Severity:  1-4 = Minimal depression, 5-9 = Mild depression, 10-14 = Moderate depression, 15-19 = Moderately severe depression, 20-27 = Severe depression   Psychosocial Evaluation and Intervention:  Psychosocial Evaluation - 02/10/20 1019      Psychosocial Evaluation & Interventions   Interventions Stress management education;Relaxation education;Encouraged to exercise with the program and follow exercise prescription    Comments M[place]on is doing well post CABG from 01/2019. He was unable to attend cardiac rehab due to COVID, but is looking forward to starting it now. 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 He admits to being more sedentary this last year due to [place] so he is really wanting to boost his stamina through exercise. He is currently on long term disability from work and doesn't know if he can go back due to his worsening vision. Insurance has also been giving him issues with his diabetes medication but he hopes to resolve it soon.    Expected Outcomes Short: attend cardiac rehab for education and exercise. Long: develop positive self care habits.    Continue Psychosocial Services  Follow up required by staff           Psychosocial Re-Evaluation:   Psychosocial Discharge (Final Psychosocial Re-Evaluation):   Vocational Rehabilitation: Provide vocational rehab assistance to qualifying candidates.   Vocational Rehab Evaluation & Intervention:  Vocational Rehab - 02/10/20 1017      Initial Vocational Rehab Evaluation & Intervention   Assessment shows need for Vocational Rehabilitation No           Education: Education Goals: Education classes will be provided on a variety of topics geared toward better understanding  of heart health and risk factor modification. Participant will state understanding/return demonstration of topics presented as noted by education test scores.  Learning Barriers/Preferences:  Learning Barriers/Preferences - 02/10/20 1016      Learning Barriers/Preferences   Learning Barriers Sight    Learning Preferences Audio;Group Instruction;Individual Instruction;Pictoral;Skilled Demonstration;Verbal Instruction           General Cardiac Education Topics:  AED/CPR: - Group verbal and written instruction with the use of models to demonstrate the basic use of the AED with the basic ABC's of resuscitation.   Anatomy and Cardiac Procedures: - Group verbal and visual presentation and models provide information about basic cardiac anatomy and function. Reviews the testing methods done to diagnose heart disease and the outcomes of the test results. Describes the treatment choices: Medical Management, Angioplasty, or Coronary Bypass Surgery for treating various heart conditions including Myocardial Infarction, Angina, Valve  Disease, and Cardiac Arrhythmias.  Written material given at graduation.   Medication Safety: - Group verbal and visual instruction to review commonly prescribed medications for heart and lung disease. Reviews the medication, class of the drug, and side effects. Includes the steps to properly store meds and maintain the prescription regimen.  Written material given at graduation.   Intimacy: - Group verbal instruction through game format to discuss how heart and lung disease can affect sexual intimacy. Written material given at graduation..   Know Your Numbers and Heart Failure: - Group verbal and visual instruction to discuss disease risk factors for cardiac and pulmonary disease and treatment options.  Reviews associated critical values for Overweight/Obesity, Hypertension, Cholesterol, and Diabetes.  Discusses basics of heart failure: signs/symptoms and treatments.   Introduces Heart Failure Zone chart for action plan for heart failure.  Written material given at graduation.   Infection Prevention: - Provides verbal and written material to individual with discussion of infection control including proper hand washing and proper equipment cleaning during exercise session.   Cardiac Rehab from 02/23/2020 in [facility] Cardiac and Pulmonary Rehab  Date 02/23/20  Educator [facility]  Instruction Review Code 1- Verbalizes Understanding      Falls Prevention: - Provides verbal and written material to individual with discussion of falls prevention and safety.   Cardiac Rehab from 02/23/2020 in [facility] Cardiac and Pulmonary Rehab  Date 02/23/20  Educator [facility]  Instruction Review Code 1- Verbalizes Understanding      Other: -Provides group and verbal instruction on various topics (see comments)   Knowledge Questionnaire Score:  Knowledge Questionnaire Score - 02/23/20 1024      Knowledge Questionnaire Score   Pre Score 23/26 Education Focus: Nutrition and Exercise           Core Components/Risk Factors/Patient Goals at Admission:  Personal Goals and Risk Factors at Admission - 02/23/20 1025      Core Components/Risk Factors/Patient Goals on Admission    Weight Management Yes;Weight Loss    Intervention Weight Management: Develop a combined nutrition and exercise program designed to reach desired caloric intake, while maintaining appropriate intake of nutrient and fiber, sodium and fats, and appropriate energy expenditure required for the weight goal.;Weight Management: Provide education and appropriate resources to help participant work on and attain dietary goals.;Weight Management/Obesity: Establish reasonable short term and long term weight goals.;Obesity: Provide education and appropriate resources to help participant work on and attain dietary goals.    Admit Weight 231 lb 1.6 oz (104.8 kg)    Goal Weight: Short Term 225 lb (102.1 kg)    Goal Weight: Long Term  220 lb (99.8 kg)    Expected Outcomes Short Term: Continue to assess and modify interventions until short term weight is achieved;Long Term: Adherence to nutrition and physical activity/exercise program aimed toward attainment of established weight goal;Understanding recommendations for meals to include 15-35% energy as protein, 25-35% energy from fat, 35-60% energy from carbohydrates, less than 200mg  of dietary cholesterol, 20-35 gm of total fiber daily;Weight Loss: Understanding of general recommendations for a balanced deficit meal plan, which promotes 1-2 lb weight loss per week and includes a negative energy balance of [phone] kcal/d;Understanding of distribution of calorie intake throughout the day with the consumption of 4-5 meals/snacks    Diabetes Yes    Intervention Provide education about signs/symptoms and action to take for hypo/hyperglycemia.;Provide education about proper nutrition, including hydration, and aerobic/resistive exercise prescription along with prescribed medications to achieve blood glucose in normal ranges: Fasting glucose  65-99 mg/dL    Expected Outcomes Short Term: Participant verbalizes understanding of the signs/symptoms and immediate care of hyper/hypoglycemia, proper foot care and importance of medication, aerobic/resistive exercise and nutrition plan for blood glucose control.;Long Term: Attainment of HbA1C < 7%.    Hypertension Yes    Intervention Provide education on lifestyle modifcations including regular physical activity/exercise, weight management, moderate sodium restriction and increased consumption of fresh fruit, vegetables, and low fat dairy, alcohol moderation, and smoking cessation.;Monitor prescription use compliance.    Expected Outcomes Short Term: Continued assessment and intervention until BP is < 140/89mm HG in hypertensive participants. < 130/66mm HG in hypertensive participants with diabetes, heart failure or chronic kidney disease.;Long Term:  Maintenance of blood pressure at goal levels.    Lipids Yes    Intervention Provide education and support for participant on nutrition & aerobic/resistive exercise along with prescribed medications to achieve LDL 70mg , HDL >40mg .    Expected Outcomes Short Term: Participant states understanding of desired cholesterol values and is compliant with medications prescribed. Participant is following exercise prescription and nutrition guidelines.;Long Term: Cholesterol controlled with medications as prescribed, with individualized exercise RX and with personalized nutrition plan. Value goals: LDL < 70mg , HDL > 40 mg.           Education:Diabetes - Individual verbal and written instruction to review signs/symptoms of diabetes, desired ranges of glucose level fasting, after meals and with exercise. Acknowledge that pre and post exercise glucose checks will be done for 3 sessions at entry of program.   Cardiac Rehab from 02/23/2020 in [facility] Cardiac and Pulmonary Rehab  Date 02/10/20  Educator [facility]  Instruction Review Code 1- Verbalizes Understanding      Core Components/Risk Factors/Patient Goals Review:    Core Components/Risk Factors/Patient Goals at Discharge (Final Review):    ITP Comments:  ITP Comments    Row Name 02/10/20 0952 02/23/20 1005         ITP Comments Initial telephone orientation completed. Diagnosis can be found in [facility] 10/7. EP orientation scheduled for 11/2 at 8am Completed 6MWT and gym orientation. Initial ITP created and sent for review to Dr. Emily Filbert, Medical Director.             Comments: Initial ITP

## 2020-02-24 ENCOUNTER — Encounter: Payer: Managed Care, Other (non HMO) | Admitting: *Deleted

## 2020-02-24 ENCOUNTER — Other Ambulatory Visit: Payer: Self-pay

## 2020-02-24 DIAGNOSIS — Z951 Presence of aortocoronary bypass graft: Secondary | ICD-10-CM

## 2020-02-24 LAB — GLUCOSE, CAPILLARY
Glucose-Capillary: 170 mg/dL — ABNORMAL HIGH (ref 70–99)
Glucose-Capillary: 124 mg/dL — ABNORMAL HIGH (ref 70–99)

## 2020-02-24 NOTE — Progress Notes (Signed)
Daily Session Note  Patient Details  Name: Chase Clark MRN: [id number] Date of Birth: 22-Nov-1965 Referring Provider:     Cardiac Rehab from 02/23/2020 in [facility] Cardiac and Pulmonary Rehab  Referring Provider Bartholome Bill MD      Encounter Date: 02/24/2020  Check In:  Session Check In - 02/24/20 0816      Check-In   Supervising physician immediately available to respond to emergencies See telemetry face sheet for immediately available ER MD    Location [facility]-Cardiac & Pulmonary Rehab    Staff Present Heath Lark, RN, BSN, CCRP;Joseph Hood RCP,RRT,BSRT;Jessica [place], [place], [place], [place], CCET    Virtual Visit No    Medication changes reported     No    Fall or balance concerns reported    No    Warm-up and Cool-down Performed on first and last piece of equipment    Resistance Training Performed Yes    VAD Patient? No    PAD/SET Patient? No      Pain Assessment   Currently in Pain? No/denies              Social History   Tobacco Use  Smoking Status Former Smoker  . Types: Cigarettes  . Quit date: 03/14/2008  . Years since quitting: 11.9  Smokeless Tobacco Never Used    Goals Met:  Exercise tolerated well Personal goals reviewed No report of cardiac concerns or symptoms  Goals Unmet:  Not Applicable  Comments: First full day of exercise!  Patient was oriented to gym and equipment including functions, settings, policies, and procedures.  Patient's individual exercise prescription and treatment plan were reviewed.  All starting workloads were established based on the results of the 6 minute walk test done at initial orientation visit.  The plan for exercise progression was also introduced and progression will be customized based on patient's performance and goals.    Dr. Emily Filbert is Medical Director for [place] and [facility].

## 2020-02-27 ENCOUNTER — Encounter: Payer: Self-pay | Admitting: Family Medicine

## 2020-02-27 ENCOUNTER — Other Ambulatory Visit: Payer: Self-pay | Admitting: Nurse Practitioner

## 2020-02-27 ENCOUNTER — Encounter: Payer: Managed Care, Other (non HMO) | Admitting: *Deleted

## 2020-02-27 ENCOUNTER — Other Ambulatory Visit: Payer: Self-pay

## 2020-02-27 DIAGNOSIS — Z951 Presence of aortocoronary bypass graft: Secondary | ICD-10-CM

## 2020-02-27 DIAGNOSIS — E785 Hyperlipidemia, unspecified: Secondary | ICD-10-CM

## 2020-02-27 LAB — GLUCOSE, CAPILLARY
Glucose-Capillary: 120 mg/dL — ABNORMAL HIGH (ref 70–99)
Glucose-Capillary: 104 mg/dL — ABNORMAL HIGH (ref 70–99)

## 2020-02-27 NOTE — Progress Notes (Signed)
Daily Session Note  Patient Details  Name: Chase Clark MRN: [id number] Date of Birth: [birth year] Referring Provider:     Cardiac Rehab from 02/23/2020 in [facility] Cardiac and Pulmonary Rehab  Referring Provider Bartholome Bill MD      Encounter Date: 02/27/2020  Check In:  Session Check In - 02/27/20 0821      Check-In   Supervising physician immediately available to respond to emergencies See telemetry face sheet for immediately available ER MD    Location [facility]-Cardiac & Pulmonary Rehab    Staff Present Heath Lark, RN, BSN, Laveda Norman, BS, ACSM CEP, Exercise Physiologist;Joseph Tessie Fass RCP,RRT,BSRT    Virtual Visit No    Medication changes reported     No    Fall or balance concerns reported    No    Warm-up and Cool-down Performed on first and last piece of equipment    Resistance Training Performed Yes    VAD Patient? No    PAD/SET Patient? No      Pain Assessment   Currently in Pain? No/denies              Social History   Tobacco Use  Smoking Status Former Smoker  . Types: Cigarettes  . Quit date: 03/14/2008  . Years since quitting: 11.9  Smokeless Tobacco Never Used    Goals Met:  Independence with exercise equipment Exercise tolerated well No report of cardiac concerns or symptoms  Goals Unmet:  Not Applicable  Comments: Pt able to follow exercise prescription today without complaint.  Will continue to monitor for progression.    Dr. Emily Filbert is Medical Director for [place] and [facility].

## 2020-02-29 ENCOUNTER — Other Ambulatory Visit: Payer: Self-pay

## 2020-02-29 ENCOUNTER — Telehealth: Payer: Managed Care, Other (non HMO)

## 2020-02-29 DIAGNOSIS — Z951 Presence of aortocoronary bypass graft: Secondary | ICD-10-CM | POA: Diagnosis not present

## 2020-02-29 LAB — GLUCOSE, CAPILLARY
Glucose-Capillary: 140 mg/dL — ABNORMAL HIGH (ref 70–99)
Glucose-Capillary: 114 mg/dL — ABNORMAL HIGH (ref 70–99)

## 2020-02-29 NOTE — Progress Notes (Signed)
Daily Session Note  Patient Details  Name: Chase Clark MRN: [id number] Date of Birth: 1966/04/02 Referring Provider:     Cardiac Rehab from 02/23/2020 in [facility] Cardiac and Pulmonary Rehab  Referring Provider Bartholome Bill MD      Encounter Date: 02/29/2020  Check In:  Session Check In - 02/29/20 0742      Check-In   Supervising physician immediately available to respond to emergencies See telemetry face sheet for immediately available ER MD    Location [facility]-Cardiac & Pulmonary Rehab    Staff Present Birdie Sons, MPA, Elveria Rising, BA, ACSM CEP, Exercise Physiologist;Joseph Darrin Nipper, [place], RCEP, CCRP, CCET    Virtual Visit No    Medication changes reported     No    Fall or balance concerns reported    No    Warm-up and Cool-down Performed on first and last piece of equipment    Resistance Training Performed Yes    VAD Patient? No    PAD/SET Patient? No      Pain Assessment   Currently in Pain? No/denies              Social History   Tobacco Use  Smoking Status Former Smoker  . Types: Cigarettes  . Quit date: 03/14/2008  . Years since quitting: 11.9  Smokeless Tobacco Never Used    Goals Met:  Independence with exercise equipment Exercise tolerated well No report of cardiac concerns or symptoms Strength training completed today  Goals Unmet:  Not Applicable  Comments: Pt able to follow exercise prescription today without complaint.  Will continue to monitor for progression.    Dr. Emily Filbert is Medical Director for [place] and [facility].

## 2020-03-01 NOTE — Telephone Encounter (Signed)
Pt is coming in tomorrow before his virtual with Dr. [name].

## 2020-03-02 ENCOUNTER — Other Ambulatory Visit: Payer: Self-pay

## 2020-03-02 ENCOUNTER — Telehealth (INDEPENDENT_AMBULATORY_CARE_PROVIDER_SITE_OTHER): Payer: Managed Care, Other (non HMO) | Admitting: Family Medicine

## 2020-03-02 ENCOUNTER — Encounter: Payer: Self-pay | Admitting: Family Medicine

## 2020-03-02 DIAGNOSIS — R140 Abdominal distension (gaseous): Secondary | ICD-10-CM

## 2020-03-02 DIAGNOSIS — I10 Essential (primary) hypertension: Secondary | ICD-10-CM

## 2020-03-02 DIAGNOSIS — E103593 Type 1 diabetes mellitus with proliferative diabetic retinopathy without macular edema, bilateral: Secondary | ICD-10-CM

## 2020-03-02 DIAGNOSIS — Z951 Presence of aortocoronary bypass graft: Secondary | ICD-10-CM | POA: Diagnosis not present

## 2020-03-02 DIAGNOSIS — N184 Chronic kidney disease, stage 4 (severe): Secondary | ICD-10-CM

## 2020-03-02 DIAGNOSIS — I2510 Atherosclerotic heart disease of native coronary artery without angina pectoris: Secondary | ICD-10-CM

## 2020-03-02 NOTE — Progress Notes (Signed)
Daily Session Note  Patient Details  Name: Chase Clark MRN: [id number] Date of Birth: [birth year] Referring Provider:     Cardiac Rehab from 02/23/2020 in [facility] Cardiac and Pulmonary Rehab  Referring Provider Bartholome Bill MD      Encounter Date: 03/02/2020  Check In:  Session Check In - 03/02/20 0755      Check-In   Supervising physician immediately available to respond to emergencies See telemetry face sheet for immediately available ER MD    Location [facility]-Cardiac & Pulmonary Rehab    Staff Present Birdie Sons, MPA, RN;Joseph Darrin Nipper, [place], RCEP, CCRP, CCET    Virtual Visit No    Medication changes reported     No    Fall or balance concerns reported    No    Warm-up and Cool-down Performed on first and last piece of equipment    Resistance Training Performed Yes    VAD Patient? No    PAD/SET Patient? No      Pain Assessment   Currently in Pain? No/denies              Social History   Tobacco Use  Smoking Status Former Smoker  . Types: Cigarettes  . Quit date: 03/14/2008  . Years since quitting: 11.9  Smokeless Tobacco Never Used    Goals Met:  Independence with exercise equipment Exercise tolerated well No report of cardiac concerns or symptoms Strength training completed today  Goals Unmet:  Not Applicable  Comments: Pt able to follow exercise prescription today without complaint.  Will continue to monitor for progression.    Dr. Emily Filbert is Medical Director for [place] and [facility].

## 2020-03-02 NOTE — Assessment & Plan Note (Signed)
Continue risk factor management.  Blood pressure is well controlled.  He will work on diabetic control.

## 2020-03-02 NOTE — Assessment & Plan Note (Signed)
Diabetes has been uncontrolled recently.  He is just now getting back into exercising.  He will work on decreasing his pasta intake.  We will continue to see his endocrinologist.

## 2020-03-02 NOTE — Assessment & Plan Note (Signed)
He will see GI as planned.

## 2020-03-02 NOTE — Assessment & Plan Note (Signed)
He reports he is establishing with a new nephrologist next week.

## 2020-03-02 NOTE — Assessment & Plan Note (Addendum)
Well-controlled.  Continue lisinopril 5 mg daily and hydralazine.  He will come in for lab work.

## 2020-03-02 NOTE — Progress Notes (Signed)
Virtual Visit via telephone Note  This visit type was conducted due to national recommendations for restrictions regarding the COVID-19 pandemic (e.g. social distancing).  This format is felt to be most appropriate for this patient at this time.  All issues noted in this document were discussed and addressed.  No physical exam was performed (except for noted visual exam findings with Video Visits).   I connected with Chase Clark today at  9:30 AM EST by telephone and verified that I am speaking with the correct person using two identifiers. Location patient: home Location provider: work  Persons participating in the virtual visit: patient, provider  I discussed the limitations, risks, security and privacy concerns of performing an evaluation and management service by telephone and the availability of in person appointments. I also discussed with the patient that there may be a patient responsible charge related to this service. The patient expressed understanding and agreed to proceed.  Interactive audio and video telecommunications were attempted between this provider and patient, however failed, due to patient having technical difficulties OR patient did not have access to video capability.  We continued and completed visit with audio only.   Reason for visit: f/u.  HPI: DIABETES Disease Monitoring: Recent A1c 9       Optho- UTD Medications: Compliance- taking basaglar 40 u BID, humalog 6 u TID Hypoglycemic symptoms- relatively frequently he will drop hours after eating, saw his endocrinologist who felt he was likely on too much basaglar, dose was decreased He now has a working dexcom.   Obesity: Patient notes he has not been exercising over the last year.  He has been eating a lot of pasta as well.  History of CABG: Patient started cardiac rehab recently.  He is in his second week and it is going well.  Has had no chest pain or shortness of breath issues.  He notes his blood  pressure has been 126-128/64-66.  He is on lisinopril, hydralazine, and as needed Lasix.   Bloating: Patient has an appointment with his GI physician next week.  He notes lots of proteins contribute to bloating.    ROS: See pertinent positives and negatives per HPI.  Past Medical History:  Diagnosis Date  . Arthritis   . Asthma    as child  . Coronary artery disease    DES DIAG1 11/20/09 [facility])  . Diabetes mellitus without complication ([place])   . Diabetic osteomyelitis ([place])   . Diabetic ulcer of foot with muscle involvement without evidence of necrosis ([place])    DIABETIC ULCERATIONS ASSOCIATED WITH IRRITATION LATERAL ANKLE LEFT GREATER THAN RIGHT WITH MILD CELLULITIS   . Diverticulosis   . DKA (diabetic ketoacidoses) 08/23/2017  . Edema, lower extremity   . Fatty liver   . GERD (gastroesophageal reflux disease)   . Gilbert's disease   . Glaucoma   . Heart disease 2011   Patient has stent for 80% blockage  . Hyperlipidemia   . Hypertension   . Neuropathy   . Osteomyelitis of left foot ([place])   . Seasonal allergies   . Shoulder pain   . Ulcer of foot ([place]) 08.06.2014   DIABETIC ULCERATIONS ASSOCIATED WITH IRRITATION LATERAL ANKLE LEFT GREATER THAN RIGHT WITH MILD CELLULITIS    Past Surgical History:  Procedure Laterality Date  . ABDOMINAL AORTOGRAM N/A 05/20/2016   Procedure: Abdominal Aortogram possible intervention;  Surgeon: Katha Cabal, MD;  Location: [facility];  Service: Cardiovascular;  Laterality: N/A;  . AMPUTATION Left 09/05/2017  Procedure: AMPUTATION BELOW KNEE;  Surgeon: Tania Ade, MD;  Location: [place];  Service: Orthopedics;  Laterality: Left;  . ANTERIOR CERVICAL DECOMP/DISCECTOMY FUSION N/A 05/07/2017   Procedure: ANTERIOR CERVICAL DECOMPRESSION/DISCECTOMY Luevenia Maxin PROSTHESIS,PLATE/SCREWS CERVICAL FIVE - CERVICAL SIX;  Surgeon: Newman Pies, MD;  Location: [place];  Service: Neurosurgery;  Laterality: N/A;  . CARDIAC CATHETERIZATION     . CATARACT EXTRACTION W/ INTRAOCULAR LENS IMPLANT    . CATARACT EXTRACTION W/PHACO Left 02/26/2017   Procedure: CATARACT EXTRACTION PHACO AND INTRAOCULAR LENS PLACEMENT (IOC);  Surgeon: Eulogio Bear, MD;  Location: [facility];  Service: Ophthalmology;  Laterality: Left;  Lot # [id number] H [place]: 00:25.3 AP%: 6.2 CDE: 1.59   . CHOLECYSTECTOMY N/A   . CORONARY ANGIOPLASTY WITH STENT PLACEMENT  2007   1st diagonal  . CORONARY ARTERY BYPASS GRAFT N/A 01/27/2019   Procedure: OFF PUMP CORONARY ARTERY BYPASS GRAFTING (CABG) X 2 WITH ENDOSCOPIC HARVESTING OF RIGHT GREATER SAPHENOUS VEIN. LIMA TO LAD;  Surgeon: Lajuana Matte, MD;  Location: [place];  Service: Open Heart Surgery;  Laterality: N/A;  . CORONARY STENT INTERVENTION N/A 01/21/2019   Procedure: CORONARY STENT INTERVENTION;  Surgeon: Belva Crome, MD;  Location: [place] [facility];  Service: Cardiovascular;  Laterality: N/A;  . EPIBLEPHERON REPAIR WITH TEAR DUCT PROBING    . EYE SURGERY Right sept 29n2015   cataract extraction  . INSERTION EXPRESS TUBE SHUNT Right 09/06/2015   Procedure: INSERTION AHMED TUBE SHUNT with tutoplast allograft;  Surgeon: Eulogio Bear, MD;  Location: [facility];  Service: Ophthalmology;  Laterality: Right;  . LEFT HEART CATH AND CORONARY ANGIOGRAPHY N/A 01/20/2019   Procedure: LEFT HEART CATH AND CORONARY ANGIOGRAPHY;  Surgeon: Lorretta Harp, MD;  Location: [place] [facility];  Service: Cardiovascular;  Laterality: N/A;  . TEE WITHOUT CARDIOVERSION N/A 01/27/2019   Procedure: TRANSESOPHAGEAL ECHOCARDIOGRAM (TEE);  Surgeon: Lajuana Matte, MD;  Location: [place];  Service: Open Heart Surgery;  Laterality: N/A;  . VASECTOMY      Family History  Problem Relation Age of Onset  . Hyperlipidemia Mother   . Diabetes Mother   . Liver disease Mother        NASH  . Hyperlipidemia Father   . Diabetes Father   . Heart disease Father   . Hypertension Father     SOCIAL HX: Former  smoker   Current Outpatient Medications:  .  acetaminophen (TYLENOL) 325 MG tablet, Take 1 tablet (325 mg total) by mouth every 6 (six) hours as needed for mild pain (pain score 1-3 or temp > 100.5)., Disp: 30 tablet, Rfl: 0 .  aspirin (ASPIRIN CHILDRENS) 81 MG chewable tablet, Chew 1 tablet (81 mg total) by mouth daily., Disp: 30 tablet, Rfl: 1 .  atorvastatin (LIPITOR) 80 MG tablet, TAKE 1 TABLET BY MOUTH EVERY DAY, Disp: 90 tablet, Rfl: 1 .  brimonidine-timolol (COMBIGAN) 0.2-0.5 % ophthalmic solution, Place 1 drop into the right eye every 12 (twelve) hours. , Disp: , Rfl:  .  cholecalciferol (VITAMIN D3) 25 MCG (1000 UT) tablet, Take 1,000 Units by mouth daily., Disp: , Rfl:  .  Continuous Blood Gluc Sensor (DEXCOM G6 SENSOR) MISC, Inject 1 Device into the skin as directed. Place a new sensor every 10 days to check blood sugar at least 4 times daily, Disp: 9 each, Rfl: 3 .  Continuous Blood Gluc Transmit (DEXCOM G6 TRANSMITTER) MISC, Inject 1 Device into the skin as directed. Use to check blood sugar at least 4  times daily, Disp: 1 each, Rfl: 3 .  dorzolamide (TRUSOPT) 2 % ophthalmic solution, 1 drop 2 (two) times daily., Disp: , Rfl:  .  esomeprazole (NEXIUM) 20 MG capsule, Take 20 mg by mouth daily. , Disp: , Rfl:  .  fluticasone (FLONASE) 50 MCG/ACT nasal spray, SPRAY 2 SPRAYS INTO EACH NOSTRIL EVERY DAY, Disp: 48 mL, Rfl: 2 .  furosemide (LASIX) 40 MG tablet, Take 40 mg by mouth daily as needed., Disp: , Rfl:  .  gabapentin (NEURONTIN) 300 MG capsule, Take 1 capsule (300 mg total) by mouth 2 (two) times daily., Disp: 60 capsule, Rfl: 6 .  hydrALAZINE (APRESOLINE) 25 MG tablet, Take by mouth., Disp: , Rfl:  .  Insulin Glargine (BASAGLAR KWIKPEN) 100 UNIT/ML, Inject 40 Units into the skin 2 (two) times daily. Further refills to come from endocrinology, Disp: 75 mL, Rfl: 3 .  insulin lispro (HUMALOG KWIKPEN) 100 UNIT/ML KwikPen, Inject 6 Units into the skin 3 (three) times daily. Inject up to  15 units TID with meals. Further refills to come from endocrinology, Disp: 30 mL, Rfl: 2 .  Insulin Pen Needle 32G X 4 MM MISC, Inject 5 pens into the skin daily. Use to inject Lantus and Novolog up to 5 times daily, Disp: 500 each, Rfl: 1 .  Lactulose 20 GM/30ML SOLN, Take 30 mLs (20 g total) by mouth as needed., Disp: 450 mL, Rfl: 1 .  latanoprost (XALATAN) 0.005 % ophthalmic solution, Place 1 drop into both eyes at bedtime., Disp: , Rfl: 5 .  lisinopril (ZESTRIL) 5 MG tablet, Take 5 mg by mouth daily., Disp: , Rfl:  .  metoprolol tartrate (LOPRESSOR) 100 MG tablet, TAKE 0.5 TABLETS (50 MG TOTAL) BY MOUTH 2 (TWO) TIMES DAILY., Disp: 90 tablet, Rfl: 1 .  senna (SENOKOT) 8.6 MG tablet, Take 1 tablet by mouth daily. , Disp: , Rfl:   EXAM: This is a telephone visit and thus no physical exam was completed.  ASSESSMENT AND PLAN:  Discussed the following assessment and plan:  Problem List Items Addressed This Visit    Bloating    He will see GI as planned.      Chronic kidney disease (CKD), stage IV (severe) ([place])    He reports he is establishing with a new nephrologist next week.      Coronary artery disease involving native coronary artery of native heart without angina pectoris    Continue risk factor management.  Blood pressure is well controlled.  He will work on diabetic control.      Essential hypertension    Well-controlled.  Continue lisinopril 5 mg daily and hydralazine.  He will come in for lab work.      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  He is just now getting back into exercising.  He will work on decreasing his pasta intake.  We will continue to see his endocrinologist.         Health maintenance: The patient will plan on getting his Covid booster and flu vaccine in the near future.   I discussed the assessment and treatment plan with the patient. The patient was provided an  opportunity to ask questions and all were answered. The patient agreed with the plan and demonstrated an understanding of the instructions.   The patient was advised to call back or seek an in-person evaluation if the symptoms worsen or if the condition fails to improve  as anticipated.  I provided 16 minutes of non-face-to-face time during this encounter.   [name], MD

## 2020-03-05 ENCOUNTER — Telehealth: Payer: Self-pay | Admitting: Internal Medicine

## 2020-03-05 ENCOUNTER — Other Ambulatory Visit: Payer: Self-pay

## 2020-03-05 ENCOUNTER — Encounter: Payer: Managed Care, Other (non HMO) | Admitting: *Deleted

## 2020-03-05 DIAGNOSIS — Z951 Presence of aortocoronary bypass graft: Secondary | ICD-10-CM

## 2020-03-05 NOTE — Telephone Encounter (Signed)
Message left for patient's wife to return my call.  

## 2020-03-05 NOTE — Telephone Encounter (Signed)
Wife Chase Clark called to request please send another email with the information needed for pt to share Chase Clark with provider.

## 2020-03-05 NOTE — Progress Notes (Signed)
Daily Session Note  Patient Details  Name: Chase Clark MRN: [id number] Date of Birth: 1966-02-27 Referring Provider:     Cardiac Rehab from 02/23/2020 in [facility] Cardiac and Pulmonary Rehab  Referring Provider Chase Bill MD      Encounter Date: 03/05/2020  Check In:  Session Check In - 03/05/20 0813      Check-In   Supervising physician immediately available to respond to emergencies See telemetry face sheet for immediately available ER MD    Location [facility]-Cardiac & Pulmonary Rehab    Staff Present Chase Lark, RN, BSN, CCRP;Chase Clark RCP,RRT,BSRT;Chase Clark, [place], ACSM CEP, Exercise Physiologist    Virtual Visit No    Medication changes reported     No    Fall or balance concerns reported    No    Warm-up and Cool-down Performed on first and last piece of equipment    Resistance Training Performed Yes    VAD Patient? No    PAD/SET Patient? No      Pain Assessment   Currently in Pain? No/denies              Social History   Tobacco Use  Smoking Status Former Smoker  . Types: Cigarettes  . Quit date: 03/14/2008  . Years since quitting: 11.9  Smokeless Tobacco Never Used    Goals Met:  Independence with exercise equipment Exercise tolerated well No report of cardiac concerns or symptoms  Goals Unmet:  Not Applicable  Comments: Pt able to follow exercise prescription today without complaint.  Will continue to monitor for progression. Weight up 7 pounds from last visit. [name] stated he did feel some fluid. He will go home and discuss with his wife about takng an extra lasix. He has new kidney doctor appointment this Wednesday   Dr. Emily Clark is Medical Director for [place] and [facility].

## 2020-03-06 NOTE — Telephone Encounter (Signed)
Please see below, patients wife returning your call from yesterday

## 2020-03-06 NOTE — Telephone Encounter (Signed)
Message left for patient to return my call.  

## 2020-03-06 NOTE — Telephone Encounter (Signed)
Chase Clark would like a call back to [phone]

## 2020-03-07 ENCOUNTER — Encounter: Payer: Self-pay | Admitting: *Deleted

## 2020-03-11 ENCOUNTER — Other Ambulatory Visit: Payer: Self-pay | Admitting: Thoracic Surgery (Cardiothoracic Vascular Surgery)

## 2020-03-12 ENCOUNTER — Other Ambulatory Visit: Payer: Self-pay

## 2020-03-12 DIAGNOSIS — Z951 Presence of aortocoronary bypass graft: Secondary | ICD-10-CM

## 2020-03-12 NOTE — Progress Notes (Signed)
Daily Session Note  Patient Details  Name: Chase Clark MRN: [id number] Date of Birth: 1965-11-04 Referring Provider:     Cardiac Rehab from 02/23/2020 in [facility] Cardiac and Pulmonary Rehab  Referring Provider Bartholome Bill MD      Encounter Date: 03/12/2020  Check In:  Session Check In - 03/12/20 0746      Check-In   Supervising physician immediately available to respond to emergencies See telemetry face sheet for immediately available ER MD    Location [facility]-Cardiac & Pulmonary Rehab    Staff Present Birdie Sons, MPA, Mauricia Area, BS, ACSM CEP, Exercise Physiologist;Joseph Tessie Fass RCP,RRT,BSRT    Virtual Visit No    Medication changes reported     No    Fall or balance concerns reported    No    Warm-up and Cool-down Performed on first and last piece of equipment    Resistance Training Performed Yes    VAD Patient? No    PAD/SET Patient? No      Pain Assessment   Currently in Pain? No/denies              Social History   Tobacco Use  Smoking Status Former Smoker  . Types: Cigarettes  . Quit date: 03/14/2008  . Years since quitting: 12.0  Smokeless Tobacco Never Used    Goals Met:  Independence with exercise equipment Exercise tolerated well No report of cardiac concerns or symptoms Strength training completed today  Goals Unmet:  Not Applicable  Comments: Pt able to follow exercise prescription today without complaint.  Will continue to monitor for progression.    Dr. Emily Filbert is Medical Director for [place] and [facility].

## 2020-03-14 ENCOUNTER — Other Ambulatory Visit: Payer: Self-pay

## 2020-03-14 ENCOUNTER — Encounter: Payer: Managed Care, Other (non HMO) | Attending: Cardiology

## 2020-03-14 ENCOUNTER — Encounter: Payer: Self-pay | Admitting: *Deleted

## 2020-03-14 DIAGNOSIS — Z951 Presence of aortocoronary bypass graft: Secondary | ICD-10-CM

## 2020-03-14 NOTE — Progress Notes (Signed)
Daily Session Note  Patient Details  Name: Chase Clark MRN: [id number] Date of Birth: 1965-10-21 Referring Provider:     Cardiac Rehab from 02/23/2020 in [facility] Cardiac and Pulmonary Rehab  Referring Provider Bartholome Bill MD      Encounter Date: 03/14/2020  Check In:  Session Check In - 03/14/20 0808      Check-In   Supervising physician immediately available to respond to emergencies See telemetry face sheet for immediately available ER MD    Location [facility]-Cardiac & Pulmonary Rehab    Staff Present Birdie Sons, MPA, RN;Amanda Oletta Darter, BA, ACSM CEP, Exercise Physiologist;Kara Eliezer Bottom, MS Exercise Physiologist    Virtual Visit No    Medication changes reported     Yes    Comments stopped taking lasix but using a new diuretic but does not remember the name of the medication    Fall or balance concerns reported    No    Warm-up and Cool-down Performed on first and last piece of equipment    Resistance Training Performed Yes    VAD Patient? No    PAD/SET Patient? No      Pain Assessment   Currently in Pain? No/denies              Social History   Tobacco Use  Smoking Status Former Smoker  . Types: Cigarettes  . Quit date: 03/14/2008  . Years since quitting: 12.0  Smokeless Tobacco Never Used    Goals Met:  Independence with exercise equipment Exercise tolerated well No report of cardiac concerns or symptoms Strength training completed today  Goals Unmet:  Not Applicable  Comments: Pt able to follow exercise prescription today without complaint.  Will continue to monitor for progression.    Dr. Emily Filbert is Medical Director for [place] and [facility].

## 2020-03-14 NOTE — Progress Notes (Signed)
Cardiac Individual Treatment Plan  Patient Details  Name: Chase Clark MRN: [id number] Date of Birth: Nov 02, 1965 Referring Provider:     Cardiac Rehab from 02/23/2020 in [facility] Cardiac and Pulmonary Rehab  Referring Provider Bartholome Bill MD      Initial Encounter Date:    Cardiac Rehab from 02/23/2020 in [facility] Cardiac and Pulmonary Rehab  Date 02/23/20      Visit Diagnosis: S/P CABG x 2  Patient's Home Medications on Admission:  Current Outpatient Medications:  .  acetaminophen (TYLENOL) 325 MG tablet, Take 1 tablet (325 mg total) by mouth every 6 (six) hours as needed for mild pain (pain score 1-3 or temp > 100.5)., Disp: 30 tablet, Rfl: 0 .  aspirin (ASPIRIN CHILDRENS) 81 MG chewable tablet, Chew 1 tablet (81 mg total) by mouth daily., Disp: 30 tablet, Rfl: 1 .  atorvastatin (LIPITOR) 80 MG tablet, TAKE 1 TABLET BY MOUTH EVERY DAY, Disp: 90 tablet, Rfl: 1 .  brimonidine-timolol (COMBIGAN) 0.2-0.5 % ophthalmic solution, Place 1 drop into the right eye every 12 (twelve) hours. , Disp: , Rfl:  .  cholecalciferol (VITAMIN D3) 25 MCG (1000 UT) tablet, Take 1,000 Units by mouth daily., Disp: , Rfl:  .  Continuous Blood Gluc Sensor (DEXCOM G6 SENSOR) MISC, Inject 1 Device into the skin as directed. Place a new sensor every 10 days to check blood sugar at least 4 times daily, Disp: 9 each, Rfl: 3 .  Continuous Blood Gluc Transmit (DEXCOM G6 TRANSMITTER) MISC, Inject 1 Device into the skin as directed. Use to check blood sugar at least 4 times daily, Disp: 1 each, Rfl: 3 .  dorzolamide (TRUSOPT) 2 % ophthalmic solution, 1 drop 2 (two) times daily., Disp: , Rfl:  .  esomeprazole (NEXIUM) 20 MG capsule, Take 20 mg by mouth daily. , Disp: , Rfl:  .  fluticasone (FLONASE) 50 MCG/ACT nasal spray, SPRAY 2 SPRAYS INTO EACH NOSTRIL EVERY DAY, Disp: 48 mL, Rfl: 2 .  furosemide (LASIX) 40 MG tablet, Take 40 mg by mouth daily as needed., Disp: , Rfl:  .  gabapentin (NEURONTIN) 300 MG capsule, Take 1  capsule (300 mg total) by mouth 2 (two) times daily., Disp: 60 capsule, Rfl: 6 .  hydrALAZINE (APRESOLINE) 25 MG tablet, Take by mouth., Disp: , Rfl:  .  Insulin Glargine (BASAGLAR KWIKPEN) 100 UNIT/ML, Inject 40 Units into the skin 2 (two) times daily. Further refills to come from endocrinology, Disp: 75 mL, Rfl: 3 .  insulin lispro (HUMALOG KWIKPEN) 100 UNIT/ML KwikPen, Inject 6 Units into the skin 3 (three) times daily. Inject up to 15 units TID with meals. Further refills to come from endocrinology, Disp: 30 mL, Rfl: 2 .  Insulin Pen Needle 32G X 4 MM MISC, Inject 5 pens into the skin daily. Use to inject Lantus and Novolog up to 5 times daily, Disp: 500 each, Rfl: 1 .  Lactulose 20 GM/30ML SOLN, Take 30 mLs (20 g total) by mouth as needed., Disp: 450 mL, Rfl: 1 .  latanoprost (XALATAN) 0.005 % ophthalmic solution, Place 1 drop into both eyes at bedtime., Disp: , Rfl: 5 .  lisinopril (ZESTRIL) 5 MG tablet, Take 5 mg by mouth daily., Disp: , Rfl:  .  metoprolol tartrate (LOPRESSOR) 100 MG tablet, TAKE 0.5 TABLETS (50 MG TOTAL) BY MOUTH 2 (TWO) TIMES DAILY., Disp: 90 tablet, Rfl: 1 .  senna (SENOKOT) 8.6 MG tablet, Take 1 tablet by mouth daily. , Disp: , Rfl:   Past Medical History:  Past Medical History:  Diagnosis Date  . Arthritis   . Asthma    as Chase  . Barrett's esophagus 02/16/2014   short segment  . Chronic kidney disease (CKD), stage IV (severe) ([place])   . Coronary artery disease    DES DIAG1 11/20/09 [facility])  . DDD (degenerative disc disease), cervical   . Diabetes mellitus without complication ([place])   . Diabetic osteomyelitis ([place])   . Diabetic peripheral neuropathy ([place])   . Diabetic ulcer of foot with muscle involvement without evidence of necrosis ([place])    DIABETIC ULCERATIONS ASSOCIATED WITH IRRITATION LATERAL ANKLE LEFT GREATER THAN RIGHT WITH MILD CELLULITIS   . Diverticulosis   . DKA (diabetic ketoacidoses) 08/23/2017  . Edema, lower extremity   . Fatty liver   .  Gastroparesis 2015  . GERD (gastroesophageal reflux disease)   . Gilbert's disease   . Glaucoma   . Heart disease 2011   Patient has stent for 80% blockage  . Hyperlipidemia   . Hypertension   . Left below-knee amputee ([place])   . Neuropathy   . Osteoarthritis of spine   . Osteomyelitis of left foot ([place])   . PVD (peripheral vascular disease) ([place])   . Seasonal allergies   . Shoulder pain   . Ulcer of foot ([place]) 08.06.2014   DIABETIC ULCERATIONS ASSOCIATED WITH IRRITATION LATERAL ANKLE LEFT GREATER THAN RIGHT WITH MILD CELLULITIS    Tobacco Use: Social History   Tobacco Use  Smoking Status Former Smoker  . Types: Cigarettes  . Quit date: 03/14/2008  . Years since quitting: 12.0  Smokeless Tobacco Never Used    Labs: Recent Review Flowsheet Data    Labs for ITP Cardiac and Pulmonary Rehab Latest Ref Rng & Units 01/27/2019 01/27/2019 01/27/2019 05/24/2019 02/22/2020   Cholestrol 0 - 200 mg/dL - - - 126 -   LDLCALC 0 - 99 mg/dL - - - 55 -   LDLDIRECT mg/dL - - - - -   HDL >39.00 mg/dL - - - 31.70(L) -   Trlycerides 0 - 149 mg/dL - - - 199.0(H) -   Hemoglobin A1c 4.0 - 5.6 % - - - 5.9 9.0(A)   PHART 7.35 - 7.45 7.412 7.356 7.330(L) - -   PCO2ART 32 - 48 mmHg 36.1 42.9 44.0 - -   HCO3 20.0 - 28.0 mmol/L 23.5 24.2 23.3 - -   TCO2 22 - 32 mmol/L 25 26 25  - -   ACIDBASEDEF 0.0 - 2.0 mmol/L 1.0 1.0 3.0(H) - -   O2SAT % 99.0 98.0 93.0 - -       Exercise Target Goals: Exercise Program Goal: Individual exercise prescription set using results from initial 6 min walk test and THRR while considering  patient's activity barriers and safety.   Exercise Prescription Goal: Initial exercise prescription builds to 30-45 minutes a day of aerobic activity, 2-3 days per week.  Home exercise guidelines will be given to patient during program as part of exercise prescription that the participant will acknowledge.   Education: Aerobic Exercise: - Group verbal and visual presentation on the  components of exercise prescription. Introduces F.I.T.T principle from ACSM for exercise prescriptions.  Reviews F.I.T.T. principles of aerobic exercise including progression. Written material given at graduation.   Education: Resistance Exercise: - Group verbal and visual presentation on the components of exercise prescription. Introduces F.I.T.T principle from ACSM for exercise prescriptions  Reviews F.I.T.T. principles of resistance exercise including progression. Written material given at graduation.    Education: Exercise &  Equipment Safety: - Individual verbal instruction and demonstration of equipment use and safety with use of the equipment.   Cardiac Rehab from 03/14/2020 in [facility] Cardiac and Pulmonary Rehab  Date 02/23/20  Educator [facility]  Instruction Review Code 1- Verbalizes Understanding      Education: Exercise Physiology & General Exercise Guidelines: - Group verbal and written instruction with models to review the exercise physiology of the cardiovascular system and associated critical values. Provides general exercise guidelines with specific guidelines to those with heart or lung disease.    Cardiac Rehab from 03/14/2020 in [facility] Cardiac and Pulmonary Rehab  Education need identified 02/23/20      Education: Flexibility, Balance, Mind/Body Relaxation: - Group verbal and visual presentation with interactive activity on the components of exercise prescription. Introduces F.I.T.T principle from ACSM for exercise prescriptions. Reviews F.I.T.T. principles of flexibility and balance exercise training including progression. Also discusses the mind body connection.  Reviews various relaxation techniques to help reduce and manage stress (i.e. Deep breathing, progressive muscle relaxation, and visualization). Balance handout provided to take home. Written material given at graduation.   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 HR 86 bpm     Max Ex. BP 174/76     2 Minute Post BP 128/64            Oxygen Initial Assessment:   Oxygen Re-Evaluation:   Oxygen Discharge (Final Oxygen Re-Evaluation):   Initial Exercise Prescription:  Initial Exercise Prescription - 02/23/20 1000      Date of Initial Exercise RX and Referring Provider   Date 02/23/20    Referring Provider Bartholome Bill MD      Treadmill   MPH 2    Grade 0.5    Minutes 15    METs 2.67      NuStep   Level 2    SPM 80    Minutes 15    METs 2      T5 Nustep   Level 2    SPM 80    Minutes 15    METs 2      Biostep-RELP   Level 2    SPM 50    Minutes 15    METs 2      Prescription Details  Frequency (times per week) 3    Duration Progress to 30 minutes of continuous aerobic without signs/symptoms of physical distress      Intensity   THRR 40-80% of Max Heartrate 108-145    Ratings of Perceived Exertion 11-13    Perceived Dyspnea 0-4      Progression   Progression Continue to progress workloads to maintain intensity without signs/symptoms of physical distress.      Resistance Training   Training Prescription  Yes    Weight 3 lb    Reps 10-15           Perform Capillary Blood Glucose checks as needed.  Exercise Prescription Changes:  Exercise Prescription Changes    Row Name 02/23/20 1000 03/05/20 1700           Response to Exercise   Blood Pressure (Admit) 126/64 128/70      Blood Pressure (Exercise) 174/76 138/66      Blood Pressure (Exit) 128/74 140/80      Heart Rate (Admit) 70 bpm 75 bpm      Heart Rate (Exercise) 86 bpm 89 bpm      Heart Rate (Exit) 69 bpm 78 bpm      Oxygen Saturation (Admit) 99 % -      Oxygen Saturation (Exercise) 98 % -      Rating of Perceived Exertion (Exercise) 13 13      Perceived Dyspnea (Exercise) 2 -      Symptoms hips sore (2/10), fatigue, SOB none      Comments walk test results -        Progression   Progression - Continue to progress workloads to maintain intensity without signs/symptoms of physical distress.      Average METs - 3.6        Resistance Training   Training Prescription - Yes      Weight - 5 lb      Reps - 10-15        NuStep   Level - 2      SPM - 80      Minutes - 15      METs - 4.2        Biostep-RELP   Level - 3      SPM - 50      Minutes - 15      METs - 3             Exercise Comments:  Exercise Comments    Row Name 02/24/20 0818 03/05/20 9702         Exercise Comments First full day of exercise!  Patient was oriented to gym and equipment including functions, settings, policies, and procedures.  Patient's individual exercise prescription and treatment plan were reviewed.  All starting workloads were established based on the results of the 6 minute walk test done at initial orientation visit.  The plan for exercise progression was also introduced and progression will be customized based on patient's performance and goals. Weight up 7 pounds from last visit. Chase Clark stated he did feel some fluid. He will go home and discuss with his wife about takng an extra lasix. He has new kidney doctor appointment this  Wednesday             Exercise Goals and Review:  Exercise Goals    Row Name 02/23/20 1018             Exercise Goals   Increase Physical Activity Yes  Intervention Provide advice, education, support and counseling about physical activity/exercise needs.;Develop an individualized exercise prescription for aerobic and resistive training based on initial evaluation findings, risk stratification, comorbidities and participant's personal goals.       Expected Outcomes Short Term: Attend rehab on a regular basis to increase amount of physical activity.;Long Term: Add in home exercise to make exercise part of routine and to increase amount of physical activity.;Long Term: Exercising regularly at least 3-5 days a week.       Increase Strength and Stamina Yes       Intervention Provide advice, education, support and counseling about physical activity/exercise needs.;Develop an individualized exercise prescription for aerobic and resistive training based on initial evaluation findings, risk stratification, comorbidities and participant's personal goals.       Expected Outcomes Short Term: Increase workloads from initial exercise prescription for resistance, speed, and METs.;Short Term: Perform resistance training exercises routinely during rehab and add in resistance training at home;Long Term: Improve cardiorespiratory fitness, muscular endurance and strength as measured by increased METs and functional capacity (6MWT)       Able to understand and use rate of perceived exertion (RPE) scale Yes       Intervention Provide education and explanation on how to use RPE scale       Expected Outcomes Short Term: Able to use RPE daily in rehab to express subjective intensity level;Long Term:  Able to use RPE to guide intensity level when exercising independently       Able to understand and use Dyspnea scale Yes       Intervention Provide education and explanation on how to use Dyspnea scale        Expected Outcomes Short Term: Able to use Dyspnea scale daily in rehab to express subjective sense of shortness of breath during exertion;Long Term: Able to use Dyspnea scale to guide intensity level when exercising independently       Knowledge and understanding of Target Heart Rate Range (THRR) Yes       Intervention Provide education and explanation of THRR including how the numbers were predicted and where they are located for reference       Expected Outcomes Short Term: Able to state/look up THRR;Short Term: Able to use daily as guideline for intensity in rehab;Long Term: Able to use THRR to govern intensity when exercising independently       Able to check pulse independently Yes       Intervention Provide education and demonstration on how to check pulse in carotid and radial arteries.;Review the importance of being able to check your own pulse for safety during independent exercise       Expected Outcomes Short Term: Able to explain why pulse checking is important during independent exercise;Long Term: Able to check pulse independently and accurately       Understanding of Exercise Prescription Yes       Intervention Provide education, explanation, and written materials on patient's individual exercise prescription       Expected Outcomes Short Term: Able to explain program exercise prescription;Long Term: Able to explain home exercise prescription to exercise independently              Exercise Goals Re-Evaluation :  Exercise Goals Re-Evaluation    Row Name 02/24/20 0818 03/05/20 1711           Exercise Goal Re-Evaluation   Exercise Goals Review Understanding of Exercise Prescription;Knowledge and understanding of Target Heart Rate Range (THRR);Able to understand and  use Dyspnea scale;Able to understand and use rate of perceived exertion (RPE) scale Increase Physical Activity;Increase Strength and Stamina      Comments Reviewed RPE and dyspnea scales, THR and program prescription  with pt today.  Pt voiced understanding and was given a copy of goals to take home. Chase Clark has tolerated exercise well in his first week.  Staff will monitor progress.      Expected Outcomes Short: Use RPE daily to regulate intensity. Long: Follow program prescription in THR. Short: attend consistently Long: increase overall stamina             Discharge Exercise Prescription (Final Exercise Prescription Changes):  Exercise Prescription Changes - 03/05/20 1700      Response to Exercise   Blood Pressure (Admit) 128/70    Blood Pressure (Exercise) 138/66    Blood Pressure (Exit) 140/80    Heart Rate (Admit) 75 bpm    Heart Rate (Exercise) 89 bpm    Heart Rate (Exit) 78 bpm    Rating of Perceived Exertion (Exercise) 13    Symptoms none      Progression   Progression Continue to progress workloads to maintain intensity without signs/symptoms of physical distress.    Average METs 3.6      Resistance Training   Training Prescription Yes    Weight 5 lb    Reps 10-15      NuStep   Level 2    SPM 80    Minutes 15    METs 4.2      Biostep-RELP   Level 3    SPM 50    Minutes 15    METs 3           Nutrition:  Target Goals: Understanding of nutrition guidelines, daily intake of sodium 1500mg , cholesterol 200mg , calories 30% from fat and 7% or less from saturated fats, daily to have 5 or more servings of fruits and vegetables.  Education: All About Nutrition: -Group instruction provided by verbal, written material, interactive activities, discussions, models, and posters to present general guidelines for heart healthy nutrition including fat, fiber, MyPlate, the role of sodium in heart healthy nutrition, utilization of the nutrition label, and utilization of this knowledge for meal planning. Follow up email sent as well. Written material given at graduation.   Cardiac Rehab from 03/14/2020 in [facility] Cardiac and Pulmonary Rehab  Education need identified 02/23/20       Biometrics:  Pre Biometrics - 02/23/20 1019      Pre Biometrics   Height 5\' 11"  (1.803 m)    Weight 231 lb 1.6 oz (104.8 kg)    BMI (Calculated) 32.25    Single Leg Stand 0.5 seconds   only did good leg           Nutrition Therapy Plan and Nutrition Goals:  Nutrition Therapy & Goals - 02/23/20 1025      Intervention Plan   Intervention Prescribe, educate and counsel regarding individualized specific dietary modifications aiming towards targeted core components such as weight, hypertension, lipid management, diabetes, heart failure and other comorbidities.    Expected Outcomes Short Term Goal: Understand basic principles of dietary content, such as calories, fat, sodium, cholesterol and nutrients.;Short Term Goal: A plan has been developed with personal nutrition goals set during dietitian appointment.;Long Term Goal: Adherence to prescribed nutrition plan.           Nutrition Assessments:  Nutrition Assessments - 02/23/20 1023      MEDFICTS Scores   Pre  Score 39          MEDIFICTS Score Key:  ?70 Need to make dietary changes   40-70 Heart Healthy Diet  ? 40 Therapeutic Level Cholesterol Diet   Picture Your Plate Scores:  <29 Unhealthy dietary pattern with much room for improvement.  41-50 Dietary pattern unlikely to meet recommendations for good health and room for improvement.  51-60 More healthful dietary pattern, with some room for improvement.   >60 Healthy dietary pattern, although there may be some specific behaviors that could be improved.    Nutrition Goals Re-Evaluation:   Nutrition Goals Discharge (Final Nutrition Goals Re-Evaluation):   Psychosocial: Target Goals: Acknowledge presence or absence of significant depression and/or stress, maximize coping skills, provide positive support system. Participant is able to verbalize types and ability to use techniques and skills needed for reducing stress and depression.   Education: Stress, Anxiety,  and Depression - Group verbal and visual presentation to define topics covered.  Reviews how body is impacted by stress, anxiety, and depression.  Also discusses healthy ways to reduce stress and to treat/manage anxiety and depression.  Written material given at graduation.   Education: Sleep Hygiene -Provides group verbal and written instruction about how sleep can affect your health.  Define sleep hygiene, discuss sleep cycles and impact of sleep habits. Review good sleep hygiene tips.    Initial Review & Psychosocial Screening:  Initial Psych Review & Screening - 02/10/20 1018      Initial Review   Current issues with Current Stress Concerns    Source of Stress Concerns Chronic Illness;Occupation;Unable to participate in former interests or hobbies;Unable to perform yard/household activities      [place]? Yes   wife     Barriers   Psychosocial barriers to participate in program There are no identifiable barriers or psychosocial needs.;The patient should benefit from training in stress management and relaxation.      Screening Interventions   Interventions Encouraged to exercise;Provide feedback about the scores to participant;To provide support and resources with identified psychosocial needs    Expected Outcomes Short Term goal: Utilizing psychosocial counselor, staff and physician to assist with identification of specific Stressors or current issues interfering with healing process. 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  A difference of  2-3 points is a clinically meaningful difference.  A difference of 2-3 points in the total score of the Quality of Life Index has been associated with significant improvement in overall quality of life, self-image, physical symptoms, and general health in studies assessing change in quality of life.  PHQ-9: Recent Review Flowsheet Data    Depression screen [facility] 2/9 02/23/2020 02/22/2019 11/19/2018 12/28/2017 09/25/2017   Decreased Interest  0 0 0 0 0   Down, Depressed, Hopeless 0 0 0 0 0   PHQ - 2 Score 0 0 0 0 0   Altered sleeping 0 - - - -   Tired, decreased energy 2 - - - -   Change in appetite 0 - - - -   Feeling bad or failure about yourself  0 - - - -   Trouble concentrating 0 - - - -   Moving slowly or fidgety/restless 0 - - - -   Suicidal thoughts 0 - - - -   PHQ-9 Score 2 - - - -   Difficult doing work/chores Not difficult at all - - - -     Interpretation of Total Score  Total Score Depression Severity:  1-4 = Minimal depression, 5-9 = Mild depression, 10-14 = Moderate depression, 15-19 = Moderately severe depression, 20-27 = Severe depression   Psychosocial Evaluation and Intervention:  Psychosocial Evaluation - 02/10/20 1019      Psychosocial Evaluation & Interventions   Interventions Stress management education;Relaxation education;Encouraged to exercise with the program and follow exercise prescription    Comments Chase Clark is doing well post CABG from 01/2019. He was unable to attend cardiac rehab due to COVID, but is looking forward to starting it now. 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 He admits to being more sedentary this last year due to  [place] so he is really wanting to boost his stamina through exercise. He is currently on long term disability from work and doesn't know if he can go back due to his worsening vision. Insurance has also been giving him issues with his diabetes medication but he hopes to resolve it soon.    Expected Outcomes Short: attend cardiac rehab for education and exercise. Long: develop positive self care habits.    Continue Psychosocial Services  Follow up required by staff           Psychosocial Re-Evaluation:   Psychosocial Discharge (Final Psychosocial Re-Evaluation):   Vocational Rehabilitation: Provide vocational rehab assistance to qualifying candidates.   Vocational Rehab Evaluation & Intervention:  Vocational Rehab - 02/10/20 1017      Initial Vocational Rehab Evaluation & Intervention   Assessment shows need for Vocational Rehabilitation No           Education: Education Goals: Education classes will be provided on a variety of topics geared toward better understanding of heart health and risk factor modification. Participant will state understanding/return demonstration of topics presented as noted by education test scores.  Learning Barriers/Preferences:  Learning Barriers/Preferences - 02/10/20 1016      Learning Barriers/Preferences   Learning Barriers Sight    Learning Preferences Audio;Group Instruction;Individual Instruction;Pictoral;Skilled Demonstration;Verbal Instruction           General Cardiac Education Topics:  AED/CPR: - Group verbal and written instruction with the use of models to demonstrate the basic use of the AED with the basic ABC's of resuscitation.   Anatomy and Cardiac Procedures: - Group verbal and visual presentation and models provide information about basic cardiac anatomy and function. Reviews the testing methods done to diagnose heart disease and the outcomes of the test results. Describes the treatment choices: Medical Management,  Angioplasty, or Coronary Bypass Surgery for treating various heart conditions including Myocardial Infarction, Angina, Valve Disease, and Cardiac Arrhythmias.  Written material given at graduation.   Medication  Safety: - Group verbal and visual instruction to review commonly prescribed medications for heart and lung disease. Reviews the medication, class of the drug, and side effects. Includes the steps to properly store meds and maintain the prescription regimen.  Written material given at graduation.   Cardiac Rehab from 03/14/2020 in [facility] Cardiac and Pulmonary Rehab  Date 02/29/20  Educator SB  Instruction Review Code 1- Verbalizes Understanding      Intimacy: - Group verbal instruction through game format to discuss how heart and lung disease can affect sexual intimacy. Written material given at graduation..   Know Your Numbers and Heart Failure: - Group verbal and visual instruction to discuss disease risk factors for cardiac and pulmonary disease and treatment options.  Reviews associated critical values for Overweight/Obesity, Hypertension, Cholesterol, and Diabetes.  Discusses basics of heart failure: signs/symptoms and treatments.  Introduces Heart Failure Zone chart for action plan for heart failure.  Written material given at graduation.   Infection Prevention: - Provides verbal and written material to individual with discussion of infection control including proper hand washing and proper equipment cleaning during exercise session.   Cardiac Rehab from 03/14/2020 in [facility] Cardiac and Pulmonary Rehab  Date 02/23/20  Educator [facility]  Instruction Review Code 1- Verbalizes Understanding      Falls Prevention: - Provides verbal and written material to individual with discussion of falls prevention and safety.   Cardiac Rehab from 03/14/2020 in [facility] Cardiac and Pulmonary Rehab  Date 02/23/20  Educator [facility]  Instruction Review Code 1- Verbalizes Understanding      Other: -Provides  group and verbal instruction on various topics (see comments)   Knowledge Questionnaire Score:  Knowledge Questionnaire Score - 02/23/20 1024      Knowledge Questionnaire Score   Pre Score 23/26 Education Focus: Nutrition and Exercise           Core Components/Risk Factors/Patient Goals at Admission:  Personal Goals and Risk Factors at Admission - 02/23/20 1025      Core Components/Risk Factors/Patient Goals on Admission    Weight Management Yes;Weight Loss    Intervention Weight Management: Develop a combined nutrition and exercise program designed to reach desired caloric intake, while maintaining appropriate intake of nutrient and fiber, sodium and fats, and appropriate energy expenditure required for the weight goal.;Weight Management: Provide education and appropriate resources to help participant work on and attain dietary goals.;Weight Management/Obesity: Establish reasonable short term and long term weight goals.;Obesity: Provide education and appropriate resources to help participant work on and attain dietary goals.    Admit Weight 231 lb 1.6 oz (104.8 kg)    Goal Weight: Short Term 225 lb (102.1 kg)    Goal Weight: Long Term 220 lb (99.8 kg)    Expected Outcomes Short Term: Continue to assess and modify interventions until short term weight is achieved;Long Term: Adherence to nutrition and physical activity/exercise program aimed toward attainment of established weight goal;Understanding recommendations for meals to include 15-35% energy as protein, 25-35% energy from fat, 35-60% energy from carbohydrates, less than 200mg  of dietary cholesterol, 20-35 gm of total fiber daily;Weight Loss: Understanding of general recommendations for a balanced deficit meal plan, which promotes 1-2 lb weight loss per week and includes a negative energy balance of [phone] kcal/d;Understanding of distribution of calorie intake throughout the day with the consumption of 4-5 meals/snacks    Diabetes Yes     Intervention Provide education about signs/symptoms and action to take for hypo/hyperglycemia.;Provide education about proper nutrition, including hydration, and aerobic/resistive  exercise prescription along with prescribed medications to achieve blood glucose in normal ranges: Fasting glucose 65-99 mg/dL    Expected Outcomes Short Term: Participant verbalizes understanding of the signs/symptoms and immediate care of hyper/hypoglycemia, proper foot care and importance of medication, aerobic/resistive exercise and nutrition plan for blood glucose control.;Long Term: Attainment of HbA1C < 7%.    Hypertension Yes    Intervention Provide education on lifestyle modifcations including regular physical activity/exercise, weight management, moderate sodium restriction and increased consumption of fresh fruit, vegetables, and low fat dairy, alcohol moderation, and smoking cessation.;Monitor prescription use compliance.    Expected Outcomes Short Term: Continued assessment and intervention until BP is < 140/37mm HG in hypertensive participants. < 130/81mm HG in hypertensive participants with diabetes, heart failure or chronic kidney disease.;Long Term: Maintenance of blood pressure at goal levels.    Lipids Yes    Intervention Provide education and support for participant on nutrition & aerobic/resistive exercise along with prescribed medications to achieve LDL 70mg , HDL >40mg .    Expected Outcomes Short Term: Participant states understanding of desired cholesterol values and is compliant with medications prescribed. Participant is following exercise prescription and nutrition guidelines.;Long Term: Cholesterol controlled with medications as prescribed, with individualized exercise RX and with personalized nutrition plan. Value goals: LDL < 70mg , HDL > 40 mg.           Education:Diabetes - Individual verbal and written instruction to review signs/symptoms of diabetes, desired ranges of glucose level fasting,  after meals and with exercise. Acknowledge that pre and post exercise glucose checks will be done for 3 sessions at entry of program.   Cardiac Rehab from 03/14/2020 in [facility] Cardiac and Pulmonary Rehab  Date 02/10/20  Educator [facility]  Instruction Review Code 1- Verbalizes Understanding      Core Components/Risk Factors/Patient Goals Review:    Core Components/Risk Factors/Patient Goals at Discharge (Final Review):    ITP Comments:  ITP Comments    Row Name 02/10/20 0952 02/23/20 1005 02/24/20 0817 03/05/20 0821 03/14/20 0948   ITP Comments Initial telephone orientation completed. Diagnosis can be found in [facility] 10/7. EP orientation scheduled for 11/2 at 8am Completed 6MWT and gym orientation. Initial ITP created and sent for review to Dr. Emily Filbert, Medical Director. First full day of exercise!  Patient was oriented to gym and equipment including functions, settings, policies, and procedures.  Patient's individual exercise prescription and treatment plan were reviewed.  All starting workloads were established based on the results of the 6 minute walk test done at initial orientation visit.  The plan for exercise progression was also introduced and progression will be customized based on patient's performance and goals. Weight up 7 pounds from last visit. Chase Clark stated he did feel some fluid. He will go home and discuss with his wife about takng an extra lasix. He has new kidney doctor appointment this Wednesday 30 Day review completed. Medical Director ITP review done, changes made as directed, and signed approval by Medical Director.          Comments:

## 2020-03-15 ENCOUNTER — Encounter: Payer: Self-pay | Admitting: Internal Medicine

## 2020-03-15 ENCOUNTER — Other Ambulatory Visit (INDEPENDENT_AMBULATORY_CARE_PROVIDER_SITE_OTHER): Payer: Managed Care, Other (non HMO)

## 2020-03-15 ENCOUNTER — Other Ambulatory Visit: Payer: Managed Care, Other (non HMO)

## 2020-03-15 ENCOUNTER — Ambulatory Visit (INDEPENDENT_AMBULATORY_CARE_PROVIDER_SITE_OTHER): Payer: Managed Care, Other (non HMO) | Admitting: Internal Medicine

## 2020-03-15 ENCOUNTER — Other Ambulatory Visit: Payer: Self-pay

## 2020-03-15 VITALS — BP 170/90 | HR 64 | Ht 71.0 in | Wt 242.0 lb

## 2020-03-15 DIAGNOSIS — R109 Unspecified abdominal pain: Secondary | ICD-10-CM | POA: Diagnosis not present

## 2020-03-15 DIAGNOSIS — K5900 Constipation, unspecified: Secondary | ICD-10-CM

## 2020-03-15 DIAGNOSIS — E785 Hyperlipidemia, unspecified: Secondary | ICD-10-CM

## 2020-03-15 DIAGNOSIS — Z1211 Encounter for screening for malignant neoplasm of colon: Secondary | ICD-10-CM

## 2020-03-15 DIAGNOSIS — R140 Abdominal distension (gaseous): Secondary | ICD-10-CM | POA: Diagnosis not present

## 2020-03-15 DIAGNOSIS — K219 Gastro-esophageal reflux disease without esophagitis: Secondary | ICD-10-CM

## 2020-03-15 DIAGNOSIS — K2270 Barrett's esophagus without dysplasia: Secondary | ICD-10-CM

## 2020-03-15 DIAGNOSIS — K3184 Gastroparesis: Secondary | ICD-10-CM

## 2020-03-15 DIAGNOSIS — Z8379 Family history of other diseases of the digestive system: Secondary | ICD-10-CM

## 2020-03-15 LAB — LIPID PANEL
Cholesterol: 161 mg/dL (ref 0–200)
HDL: 34.1 mg/dL — ABNORMAL LOW (ref >39.00)
NonHDL: 126.47
Total CHOL/HDL Ratio: 5
Triglycerides: 372 mg/dL — ABNORMAL HIGH (ref 0.0–149.0)
VLDL: 74.4 mg/dL — ABNORMAL HIGH (ref 0.0–40.0)

## 2020-03-15 LAB — COMPREHENSIVE METABOLIC PANEL
ALT: 22 U/L (ref 0–53)
AST: 30 U/L (ref 0–37)
Albumin: 2.7 g/dL — ABNORMAL LOW (ref 3.5–5.2)
Alkaline Phosphatase: 112 U/L (ref 39–117)
BUN: 34 mg/dL — ABNORMAL HIGH (ref 6–23)
CO2: 26 mEq/L (ref 19–32)
Calcium: 8.3 mg/dL — ABNORMAL LOW (ref 8.4–10.5)
Chloride: 105 mEq/L (ref 96–112)
Creatinine, Ser: 4.13 mg/dL — ABNORMAL HIGH (ref 0.40–1.50)
GFR: 15.33 mL/min — ABNORMAL LOW (ref >60.00)
Glucose, Bld: 254 mg/dL — ABNORMAL HIGH (ref 70–99)
Potassium: 4.1 mEq/L (ref 3.5–5.1)
Sodium: 138 mEq/L (ref 135–145)
Total Bilirubin: 0.5 mg/dL (ref 0.2–1.2)
Total Protein: 5 g/dL — ABNORMAL LOW (ref 6.0–8.3)

## 2020-03-15 LAB — LDL CHOLESTEROL, DIRECT: Direct LDL: 75 mg/dL

## 2020-03-15 MED ORDER — SUTAB 1479-225-188 MG PO TABS: ORAL_TABLET | ORAL | 0 refills | Status: DC

## 2020-03-15 NOTE — Patient Instructions (Addendum)
You have been scheduled for an endoscopy and colonoscopy. Please follow the written instructions given to you at your visit today. Please pick up your prep supplies at the pharmacy within the next 1-3 days. If you use inhalers (even only as needed), please bring them with you on the day of your procedure.  Continue Nexium 20 mg OTC daily.  We have sent the following medications to your pharmacy for you to pick up at your convenience: Reglan every night  We have given you a gastroparesis diet to look over and follow.  If you are age 70 or older, your body mass index should be between 23-30. Your Body mass index is 33.75 kg/m. If this is out of the aforementioned range listed, please consider follow up with your Primary Care Provider.  If you are age 41 or younger, your body mass index should be between 19-25. Your Body mass index is 33.75 kg/m. If this is out of the aformentioned range listed, please consider follow up with your Primary Care Provider.   Due to recent changes in healthcare laws, you may see the results of your imaging and laboratory studies on MyChart before your provider has had a chance to review them.  We understand that in some cases there may be results that are confusing or concerning to you. Not all laboratory results come back in the same time frame and the provider may be waiting for multiple results in order to interpret others.  Please give [place] 48 hours in order for your provider to thoroughly review all the results before contacting the office for clarification of your results.

## 2020-03-15 NOTE — Progress Notes (Signed)
Patient ID: Chase Clark, male   DOB: [birth year], 54 y.o.   MRN: [id number] HPI: Chase Clark is a 54 year old male with a past medical history of GERD, gastroparesis, colonic diverticulosis, CAD status post two-vessel CABG in October 2020, diabetes with diabetic neuropathy, retinopathy with blindness who was seen in consult at the request of Dr. [name] to evaluate his history of GERD but also with abdominal bloating and family history of NASH cirrhosis.  He is here alone today.  He has GI history from Dr. Candace Cruise with prior upper endoscopy in 2013 and 2015 and colonoscopy in 2013.  Some of these records are available and reviewed EGD 12/19/2011 --LA grade a reflux esophagitis which was biopsied to exclude Barrett's.  Goblet cells were present without dysplasia or malignancy. EGD November 2015 --esophageal mucosal changes consistent with short segment Barrett's, large amount of food in the stomach, normal examined duodenum.  Pathology reflux esophagitis without Barrett's esophagus Colonoscopy 12/19/2011 fair prep.  Diverticulosis sigmoid.  Exam otherwise normal.  He reports that he does have intermittent issues with GI symptoms.  He has a history of heartburn but this has been controlled with Nexium which he takes 20 mg daily.  If he misses a dose he will have heartburn symptom.  He does feel mid abdominal bloating, early satiety and nausea which is worse in the morning.  Sometimes at night he will wake up and vomit and this will relieve the pressure and nausea symptom.  He at times has early fullness.  No dysphagia symptom.  Bowel habits for him are fairly regular though he does have constipation predominance.  He is taking senna usually 2 tablets at bedtime.  If he uses 3 he can get diarrhea.  He usually has a bowel movement every day and stools are generally soft.  His vision limits inspection but he has not seen any bright red blood.  His mother had a history of NASH cirrhosis.  Past Medical History:   Diagnosis Date  . Arthritis   . Asthma    as child  . Barrett's esophagus 02/16/2014   short segment  . Chronic kidney disease (CKD), stage IV (severe) ([place])   . Coronary artery disease    DES DIAG1 11/20/09 [facility])  . DDD (degenerative disc disease), cervical   . Diabetes mellitus without complication ([place])   . Diabetic osteomyelitis ([place])   . Diabetic peripheral neuropathy ([place])   . Diabetic ulcer of foot with muscle involvement without evidence of necrosis ([place])    DIABETIC ULCERATIONS ASSOCIATED WITH IRRITATION LATERAL ANKLE LEFT GREATER THAN RIGHT WITH MILD CELLULITIS   . Diverticulosis   . DKA (diabetic ketoacidoses) 08/23/2017  . Edema, lower extremity   . Fatty liver   . Gastroparesis 2015  . GERD (gastroesophageal reflux disease)   . Gilbert's disease   . Glaucoma   . Heart disease 2011   Patient has stent for 80% blockage  . Hyperlipidemia   . Hypertension   . Left below-knee amputee (Chase Clark)   . Neuropathy   . Osteoarthritis of spine   . Osteomyelitis of left foot ([place])   . PVD (peripheral vascular disease) ([place])   . Seasonal allergies   . Shoulder pain   . Ulcer of foot ([place]) 08.06.2014   DIABETIC ULCERATIONS ASSOCIATED WITH IRRITATION LATERAL ANKLE LEFT GREATER THAN RIGHT WITH MILD CELLULITIS    Past Surgical History:  Procedure Laterality Date  . ABDOMINAL AORTOGRAM N/A 05/20/2016   Procedure: Abdominal Aortogram possible intervention;  Surgeon: Dolores Lory  Schnier, MD;  Location: [place] [facility];  Service: Cardiovascular;  Laterality: N/A;  . AMPUTATION Left 09/05/2017   Procedure: AMPUTATION BELOW KNEE;  Surgeon: Tania Ade, MD;  Location: [place];  Service: Orthopedics;  Laterality: Left;  . ANTERIOR CERVICAL DECOMP/DISCECTOMY FUSION N/A 05/07/2017   Procedure: ANTERIOR CERVICAL DECOMPRESSION/DISCECTOMY Luevenia Maxin PROSTHESIS,PLATE/SCREWS CERVICAL FIVE - CERVICAL SIX;  Surgeon: Newman Pies, MD;  Location: [place];  Service: Neurosurgery;   Laterality: N/A;  . CARDIAC CATHETERIZATION    . CATARACT EXTRACTION W/ INTRAOCULAR LENS IMPLANT    . CATARACT EXTRACTION W/PHACO Left 02/26/2017   Procedure: CATARACT EXTRACTION PHACO AND INTRAOCULAR LENS PLACEMENT (IOC);  Surgeon: Eulogio Bear, MD;  Location: [facility];  Service: Ophthalmology;  Laterality: Left;  Lot # [id number] H [place]: 00:25.3 AP%: 6.2 CDE: 1.59   . CHOLECYSTECTOMY N/A   . CORONARY ANGIOPLASTY WITH STENT PLACEMENT  2007   1st diagonal  . CORONARY ARTERY BYPASS GRAFT N/A 01/27/2019   Procedure: OFF PUMP CORONARY ARTERY BYPASS GRAFTING (CABG) X 2 WITH ENDOSCOPIC HARVESTING OF RIGHT GREATER SAPHENOUS VEIN. LIMA TO LAD;  Surgeon: Lajuana Matte, MD;  Location: [place];  Service: Open Heart Surgery;  Laterality: N/A;  . CORONARY STENT INTERVENTION N/A 01/21/2019   Procedure: CORONARY STENT INTERVENTION;  Surgeon: Belva Crome, MD;  Location: [place] [facility];  Service: Cardiovascular;  Laterality: N/A;  . EPIBLEPHERON REPAIR WITH TEAR DUCT PROBING    . EYE SURGERY Right sept 29n2015   cataract extraction  . INSERTION EXPRESS TUBE SHUNT Right 09/06/2015   Procedure: INSERTION AHMED TUBE SHUNT with tutoplast allograft;  Surgeon: Eulogio Bear, MD;  Location: [facility];  Service: Ophthalmology;  Laterality: Right;  . LEFT HEART CATH AND CORONARY ANGIOGRAPHY N/A 01/20/2019   Procedure: LEFT HEART CATH AND CORONARY ANGIOGRAPHY;  Surgeon: Lorretta Harp, MD;  Location: [place];  Service: Cardiovascular;  Laterality: N/A;  . TEE WITHOUT CARDIOVERSION N/A 01/27/2019   Procedure: TRANSESOPHAGEAL ECHOCARDIOGRAM (TEE);  Surgeon: Lajuana Matte, MD;  Location: [place];  Service: Open Heart Surgery;  Laterality: N/A;  . VASECTOMY      Outpatient Medications Prior to Visit  Medication Sig Dispense Refill  . acetaminophen (TYLENOL) 325 MG tablet Take 1 tablet (325 mg total) by mouth every 6 (six) hours as needed for mild pain (pain score 1-3 or temp > 100.5).  30 tablet 0  . amLODipine (NORVASC) 5 MG tablet     . aspirin (ASPIRIN CHILDRENS) 81 MG chewable tablet Chew 1 tablet (81 mg total) by mouth daily. 30 tablet 1  . atorvastatin (LIPITOR) 80 MG tablet TAKE 1 TABLET BY MOUTH EVERY DAY 90 tablet 1  . brimonidine-timolol (COMBIGAN) 0.2-0.5 % ophthalmic solution Place 1 drop into the right eye every 12 (twelve) hours.     . cholecalciferol (VITAMIN D3) 25 MCG (1000 UT) tablet Take 1,000 Units by mouth daily.    . Continuous Blood Gluc Sensor (DEXCOM G6 SENSOR) MISC Inject 1 Device into the skin as directed. Place a new sensor every 10 days to check blood sugar at least 4 times daily 9 each 3  . Continuous Blood Gluc Transmit (DEXCOM G6 TRANSMITTER) MISC Inject 1 Device into the skin as directed. Use to check blood sugar at least 4 times daily 1 each 3  . dorzolamide (TRUSOPT) 2 % ophthalmic solution 1 drop 2 (two) times daily.    Marland Kitchen esomeprazole (NEXIUM) 20 MG capsule Take 20 mg by mouth daily.     Marland Kitchen  fluticasone (FLONASE) 50 MCG/ACT nasal spray SPRAY 2 SPRAYS INTO EACH NOSTRIL EVERY DAY 48 mL 2  . gabapentin (NEURONTIN) 300 MG capsule Take 1 capsule (300 mg total) by mouth 2 (two) times daily. 60 capsule 6  . hydrALAZINE (APRESOLINE) 25 MG tablet Take by mouth.    . Insulin Glargine (BASAGLAR KWIKPEN) 100 UNIT/ML Inject 40 Units into the skin 2 (two) times daily. Further refills to come from endocrinology 75 mL 3  . insulin lispro (HUMALOG KWIKPEN) 100 UNIT/ML KwikPen Inject 6 Units into the skin 3 (three) times daily. Inject up to 15 units TID with meals. Further refills to come from endocrinology 30 mL 2  . Insulin Pen Needle 32G X 4 MM MISC Inject 5 pens into the skin daily. Use to inject Lantus and Novolog up to 5 times daily 500 each 1  . Lactulose 20 GM/30ML SOLN Take 30 mLs (20 g total) by mouth as needed. 450 mL 1  . latanoprost (XALATAN) 0.005 % ophthalmic solution Place 1 drop into both eyes at bedtime.  5  . lisinopril (ZESTRIL) 5 MG tablet  Take 5 mg by mouth daily.    . metoprolol tartrate (LOPRESSOR) 100 MG tablet TAKE 0.5 TABLETS (50 MG TOTAL) BY MOUTH 2 (TWO) TIMES DAILY. 90 tablet 1  . senna (SENOKOT) 8.6 MG tablet Take 1 tablet by mouth daily.     Marland Kitchen torsemide (DEMADEX) 20 MG tablet Take by mouth.    . furosemide (LASIX) 40 MG tablet Take 40 mg by mouth daily as needed.     No facility-administered medications prior to visit.    Allergies  Allergen Reactions  . Ozempic (0.25 Or 0.5 Mg-Dose) [Semaglutide(0.25 Or 0.5mg -Dos)] Nausea And Vomiting and Other (See Comments)    Triggered occular migraines    Family History  Problem Relation Age of Onset  . Hyperlipidemia Mother   . Diabetes Mother   . Liver disease Mother        NASH  . Other Mother        immune thrombocytopenic pupura (ITP)  . Hyperlipidemia Father   . Diabetes Father   . Heart disease Father   . Hypertension Father   . Lymphoma Father   . Hemochromatosis Other        Grandmother (? which side)  . Polycythemia Other     Social History   Tobacco Use  . Smoking status: Former Smoker    Types: Cigarettes    Quit date: 03/14/2008    Years since quitting: 12.0  . Smokeless tobacco: Never Used  Vaping Use  . Vaping Use: Never used  Substance Use Topics  . Alcohol use: Yes    Comment: OCCAS  . Drug use: No    ROS: As per history of present illness, otherwise negative  BP (!) 170/90   Pulse 64   Ht 5\' 11"  (1.803 m)   Wt 242 lb (109.8 kg)   BMI 33.75 kg/m  Constitutional: Well-developed and well-nourished. No distress. HEENT: Normocephalic and atraumatic.  Cardiovascular: Normal rate, regular rhythm and intact distal pulses. No M/R/G Pulmonary/chest: Effort normal and breath sounds normal. No wheezing, rales or rhonchi. Abdominal: Soft, nontender, nondistended. Bowel sounds active throughout. There are no masses palpable. No hepatosplenomegaly.  Glucose monitor right abdomen. Extremities: no clubbing, cyanosis, or edema, left BKA with  prosthesis in place Neurological: Alert and oriented to person place and time. Skin: Skin is warm and dry.  Psychiatric: Normal mood and affect. Behavior is normal.  RELEVANT LABS  AND IMAGING: CBC    Component Value Date/Time   WBC 7.4 03/09/2019 1551   RBC 4.57 03/09/2019 1551   HGB 12.5 (L) 03/09/2019 1551   HGB 9.8 (L) 02/14/2019 1038   HCT 39.4 03/09/2019 1551   HCT 29.8 (L) 02/14/2019 1038   PLT 160 03/09/2019 1551   PLT 272 02/14/2019 1038   MCV 86.2 03/09/2019 1551   MCV 86 02/14/2019 1038   MCH 27.4 03/09/2019 1551   MCHC 31.7 03/09/2019 1551   RDW 13.5 03/09/2019 1551   RDW 13.5 02/14/2019 1038   LYMPHSABS 1.0 03/09/2019 1551   MONOABS 0.6 03/09/2019 1551   EOSABS 0.4 03/09/2019 1551   BASOSABS 0.1 03/09/2019 1551    CMP     Component Value Date/Time   NA 138 05/24/2019 0905   NA 140 02/14/2019 1038   K 4.4 05/24/2019 0905   K 4.5 12/29/2013 1521   CL 108 05/24/2019 0905   CO2 24 05/24/2019 0905   GLUCOSE 150 (H) 05/24/2019 0905   BUN 40 (H) 05/24/2019 0905   BUN 26 (H) 02/14/2019 1038   CREATININE 2.70 (H) 05/24/2019 0905   CREATININE 2.42 (H) 12/31/2018 1454   CALCIUM 8.7 05/24/2019 0905   PROT 5.5 (L) 05/24/2019 0905   PROT 5.5 (L) 05/24/2019 0905   ALBUMIN 3.1 (L) 05/24/2019 0905   ALBUMIN 3.1 (L) 05/24/2019 0905   AST 26 05/24/2019 0905   AST 26 05/24/2019 0905   ALT 23 05/24/2019 0905   ALT 23 05/24/2019 0905   ALKPHOS 109 05/24/2019 0905   ALKPHOS 109 05/24/2019 0905   BILITOT 0.6 05/24/2019 0905   BILITOT 0.6 05/24/2019 0905   GFRNONAA 24 (L) 03/09/2019 1551   GFRAA 28 (L) 03/09/2019 1551    ASSESSMENT/PLAN: 55 year old male with a past medical history of GERD, gastroparesis, colonic diverticulosis, CAD status post two-vessel CABG in October 2020, diabetes with diabetic neuropathy, retinopathy with blindness who was seen in consult at the request of Dr. [name] to evaluate his history of GERD but also with abdominal bloating and family  history of NASH cirrhosis.  1.  GERD with history of Barrett's esophagus --he had Barrett's esophagus by biopsy in 2013 but no Barrett's esophagus by biopsy in 2015.  He has been on low-dose Nexium 20 mg daily and his heartburn symptoms appear well controlled.  I do think given his history of Barrett's surveillance endoscopy is reasonable as his last exam was 6 years ago.  We discussed the risk, benefits and alternatives and he is agreeable and wishes to proceed --Continue Nexium 20 mg daily --EGD for Barrett's surveillance  2.  Abdominal bloating/intermittent nausea with vomiting/early satiety/gastroparesis --he very very likely has gastroparesis given the large amount of retained food seen in 2015 at EGD after an overnight fast.  His symptoms are consistent with this diagnosis and he does have diabetes with neuropathy which supports this diagnosis.  We discussed this today at length and I recommended the following --Gastroparesis diet, step 3 foods; including avoiding eating late at night --Metoclopramide 10 mg nightly; we did discuss the rare possible neurologic complications from this medication together today.  He understands and wishes to try this medication.  I also asked that he let me know should he have any side effects from medication including muscle twitching or tremor.  3.  Colon cancer screening --fair prep a colonoscopy 8 years ago.  I recommend we repeat colonoscopy for screening with 2-day prep --Colonoscopy in the [place] with 2-day prep  4.  Mild constipation --treated with senna 2 tablets at bedtime which she can continue.  May consider adding fiber but would like to treat gastroparesis first.  5.  Family history of NASH cirrhosis --no personal history of advanced liver disease.  His liver enzymes were normal when checked earlier this year.  He certainly has risk factors for fatty liver disease in his hypertension, hyperlipidemia, obesity, diabetes, family history of fatty liver  disease, and vascular disease. --Risk factor modification which he is already doing --Abdominal ultrasound with elastography  4 to 14-month follow-up with me  CC: Dr. [name]   Cc:[name], Md [address] [place],  [place] [address]

## 2020-03-16 ENCOUNTER — Telehealth: Payer: Self-pay | Admitting: *Deleted

## 2020-03-16 DIAGNOSIS — Z8379 Family history of other diseases of the digestive system: Secondary | ICD-10-CM

## 2020-03-16 NOTE — Telephone Encounter (Signed)
Patient has been scheduled for ultrasound with elastography on 03/22/20 at 10:30 am with a 10:15 am arrival at [facility] radiology. NPO 6 hours prior to test.   I called patient but he was in a "meeting" per wife, Chase Clark. Per patient HIPAA form, wife may be given medical information. I gave appointment information to [facility]. She verbalizes understanding and noted that he may need to change the appointment since he has an appointment at 2:45 pm on 03/22/20. I advised that the elastography should only take 30 minutes-1 hour at most, however, they are free to reschedule if needed. Radiology scheduling number has been provided should they need to reschedule.

## 2020-03-16 NOTE — Telephone Encounter (Signed)
-----   Message from Jerene Bears, MD sent at 03/15/2020 12:05 PM EST ----- Forgot to tell you: Abd [place] with elastography

## 2020-03-19 ENCOUNTER — Telehealth: Payer: Self-pay | Admitting: *Deleted

## 2020-03-19 ENCOUNTER — Encounter: Payer: Self-pay | Admitting: *Deleted

## 2020-03-19 ENCOUNTER — Telehealth: Payer: Self-pay | Admitting: Family Medicine

## 2020-03-19 DIAGNOSIS — Z951 Presence of aortocoronary bypass graft: Secondary | ICD-10-CM

## 2020-03-19 NOTE — Telephone Encounter (Signed)
Noted. Please call the patient to clarify this. It appears that there is a phone message in his chart where the wife call the cardiac rehab stating that he tested positive. Please see if they have his result yet. If they do not have his result please call the infusion clinic and let them know that we do not actually have a positive result for this patient.  Thanks.

## 2020-03-19 NOTE — Telephone Encounter (Signed)
I called and spoke with the patient's wife he was in a meeting and I informed her that the patient's test was positive for Covid-19 and she stated she had called twice and did not have those results, the patient does want an antibody infusion, she is having her infusion tomorrow morning and I called and left the information for the infusion on the infusion hotline.  Jhania Etherington,cma

## 2020-03-19 NOTE — Telephone Encounter (Signed)
I called and spoke with the patient and they are still awaiting on results for the covid test.  Mitch Arquette,cma

## 2020-03-19 NOTE — Telephone Encounter (Signed)
Pt's wife left message.  Brix has tested positive for COVID.  Message said he would be back next week.  Left message for them that we need test dates to verify return dates.

## 2020-03-19 NOTE — Telephone Encounter (Signed)
It appears that there is a message in this patients chart that he tested positive for COVID. Can you call him and see how he is doing and see if would be interested in the monoclonal antibody infusion? Thanks.

## 2020-03-20 ENCOUNTER — Telehealth: Payer: Self-pay | Admitting: Family Medicine

## 2020-03-20 ENCOUNTER — Telehealth: Payer: Self-pay | Admitting: Internal Medicine

## 2020-03-20 NOTE — Telephone Encounter (Signed)
Pt's wife called and states that pt's covid test was positive. The infusion center is going to call her back about getting him set up.  Cloyde Oregel,cma

## 2020-03-20 NOTE — Telephone Encounter (Signed)
Pt's wife called and states that pt's covid test was positive. The infusion center is going to call her back about getting him set up

## 2020-03-20 NOTE — Telephone Encounter (Signed)
Patient's wife called on behalf of patient - wanted to know what Dr Kelton Pillar wanted patient's range to be for sugar levels? Please advise. Ph# [phone]

## 2020-03-20 NOTE — Telephone Encounter (Signed)
Patient's wife called in want to know when results come in from [facility] they would like a call about the results from covid test

## 2020-03-21 ENCOUNTER — Ambulatory Visit: Payer: Managed Care, Other (non HMO) | Admitting: Plastic Surgery

## 2020-03-21 NOTE — Telephone Encounter (Signed)
Please reach back out to the patients wife. It can take up to 72 hours to hear from the infusion center once a referral has been made. If a referral needs to be made please call the infusion center with his information.

## 2020-03-21 NOTE — Telephone Encounter (Signed)
I called the patient and asked her what number she called for the infusion and we do have the same number, I informed her that it could take anywhere from 24 hors to 48 hrs to hear from them.  I informed the patient's wife that I would call as well to make sure they have a MRN #, so I called and left a VM with all the information for them to schedule a infusion.  Ginamarie Banfield,cma

## 2020-03-21 NOTE — Telephone Encounter (Signed)
Pt's wife wants pt to be referred to the infusion center ASAP. Please advise at your earliest convenience.

## 2020-03-21 NOTE — Telephone Encounter (Signed)
As long as they don't drop below 70 and don't stay over 180 , is good for him

## 2020-03-21 NOTE — Telephone Encounter (Signed)
Spoken to patient and notified Dr Shamleffer's comments. Verbalized understanding.   

## 2020-03-22 ENCOUNTER — Other Ambulatory Visit: Payer: Self-pay | Admitting: Physician Assistant

## 2020-03-22 ENCOUNTER — Other Ambulatory Visit: Payer: Self-pay | Admitting: Family Medicine

## 2020-03-22 ENCOUNTER — Ambulatory Visit (HOSPITAL_COMMUNITY): Payer: Managed Care, Other (non HMO)

## 2020-03-22 DIAGNOSIS — E1139 Type 2 diabetes mellitus with other diabetic ophthalmic complication: Secondary | ICD-10-CM

## 2020-03-22 DIAGNOSIS — IMO0002 Reserved for concepts with insufficient information to code with codable children: Secondary | ICD-10-CM

## 2020-03-22 DIAGNOSIS — Z951 Presence of aortocoronary bypass graft: Secondary | ICD-10-CM

## 2020-03-22 DIAGNOSIS — U071 COVID-19: Secondary | ICD-10-CM

## 2020-03-22 DIAGNOSIS — N184 Chronic kidney disease, stage 4 (severe): Secondary | ICD-10-CM

## 2020-03-22 DIAGNOSIS — I10 Essential (primary) hypertension: Secondary | ICD-10-CM

## 2020-03-22 MED ORDER — EZETIMIBE 10 MG PO TABS: 10.0000 mg | ORAL_TABLET | Freq: Every day | ORAL | 3 refills | Status: DC

## 2020-03-22 NOTE — Progress Notes (Signed)
I connected by phone with [name] on 03/22/2020 at 11:44 AM to discuss the potential use of a new treatment for mild to moderate COVID-19 viral infection in non-hospitalized patients.  This patient is a 54 y.o. male that meets the FDA criteria for Emergency Use Authorization of COVID monoclonal antibody casirivimab/imdevimab, bamlanivimab/eteseviamb, or sotrovimab.  Has a (+) direct SARS-CoV-2 viral test result  Has mild or moderate COVID-19   Is NOT hospitalized due to COVID-19  Is within 10 days of symptom onset  Has at least one of the high risk factor(s) for progression to severe COVID-19 and/or hospitalization as defined in EUA.  Specific high risk criteria : BMI > 25, Diabetes and Cardiovascular disease or hypertension   I have spoken and communicated the following to the patient or parent/caregiver regarding COVID monoclonal antibody treatment:  1. FDA has authorized the emergency use for the treatment of mild to moderate COVID-19 in adults and pediatric patients with positive results of direct SARS-CoV-2 viral testing who are 1 years of age and older weighing at least 40 kg, and who are at high risk for progressing to severe COVID-19 and/or hospitalization.  2. The significant known and potential risks and benefits of COVID monoclonal antibody, and the extent to which such potential risks and benefits are unknown.  3. Information on available alternative treatments and the risks and benefits of those alternatives, including clinical trials.  4. Patients treated with COVID monoclonal antibody should continue to self-isolate and use infection control measures (e.g., wear mask, isolate, social distance, avoid sharing personal items, clean and disinfect "high touch" surfaces, and frequent handwashing) according to CDC guidelines.   5. The patient or parent/caregiver has the option to accept or refuse COVID monoclonal antibody treatment.  After reviewing this information with  the patient, the patient has agreed to receive one of the available covid 19 monoclonal antibodies and will be provided an appropriate fact sheet prior to infusion. Tami Lin Theresia Pree, [place] 03/22/2020 11:44 AM

## 2020-03-23 ENCOUNTER — Ambulatory Visit (HOSPITAL_COMMUNITY)
Admission: RE | Admit: 2020-03-23 | Discharge: 2020-03-23 | Disposition: A | Discharge status: Home / Self Care | Payer: Managed Care, Other (non HMO) | Source: Ambulatory Visit | Attending: Pulmonary Disease | Admitting: Pulmonary Disease

## 2020-03-23 ENCOUNTER — Telehealth: Payer: Self-pay

## 2020-03-23 DIAGNOSIS — IMO0002 Reserved for concepts with insufficient information to code with codable children: Secondary | ICD-10-CM

## 2020-03-23 DIAGNOSIS — I10 Essential (primary) hypertension: Secondary | ICD-10-CM | POA: Diagnosis present

## 2020-03-23 DIAGNOSIS — Z951 Presence of aortocoronary bypass graft: Secondary | ICD-10-CM | POA: Insufficient documentation

## 2020-03-23 DIAGNOSIS — U071 COVID-19: Secondary | ICD-10-CM | POA: Diagnosis present

## 2020-03-23 DIAGNOSIS — E1165 Type 2 diabetes mellitus with hyperglycemia: Secondary | ICD-10-CM | POA: Diagnosis present

## 2020-03-23 DIAGNOSIS — E1139 Type 2 diabetes mellitus with other diabetic ophthalmic complication: Secondary | ICD-10-CM | POA: Insufficient documentation

## 2020-03-23 MED ORDER — ALBUTEROL SULFATE HFA 108 (90 BASE) MCG/ACT IN AERS: 2.0000 | INHALATION_SPRAY | Freq: Once | RESPIRATORY_TRACT | Status: DC | PRN

## 2020-03-23 MED ORDER — EPINEPHRINE 0.3 MG/0.3ML IJ SOAJ: 0.3000 mg | Freq: Once | INTRAMUSCULAR | Status: DC | PRN

## 2020-03-23 MED ORDER — LISINOPRIL 5 MG PO TABS
5.0000 mg | ORAL_TABLET | Freq: Every day | ORAL | 1 refills | Status: DC
Start: 2020-03-23 — End: 2020-08-12

## 2020-03-23 MED ORDER — SODIUM CHLORIDE 0.9 % IV SOLN: INTRAVENOUS | Status: DC | PRN

## 2020-03-23 MED ORDER — FAMOTIDINE IN NACL 20-0.9 MG/50ML-% IV SOLN: 20.0000 mg | Freq: Once | INTRAVENOUS | Status: DC | PRN

## 2020-03-23 MED ORDER — METHYLPREDNISOLONE SODIUM SUCC 125 MG IJ SOLR: 125.0000 mg | Freq: Once | INTRAMUSCULAR | Status: DC | PRN

## 2020-03-23 MED ORDER — SODIUM CHLORIDE 0.9 % IV SOLN
1200.0000 mg | Freq: Once | INTRAVENOUS | Status: AC
  Administered 2020-03-23: 1200 mg via INTRAVENOUS

## 2020-03-23 MED ORDER — DIPHENHYDRAMINE HCL 50 MG/ML IJ SOLN: 50.0000 mg | Freq: Once | INTRAMUSCULAR | Status: DC | PRN

## 2020-03-23 NOTE — Discharge Instructions (Signed)
10 Things You Can Do to Manage Your COVID-19 Symptoms at Home If you have possible or confirmed COVID-19: 1. Stay home from work and school. And stay away from other public places. If you must go out, avoid using any kind of public transportation, ridesharing, or taxis. 2. Monitor your symptoms carefully. If your symptoms get worse, call your healthcare provider immediately. 3. Get rest and stay hydrated. 4. If you have a medical appointment, call the healthcare provider ahead of time and tell them that you have or may have COVID-19. 5. For medical emergencies, call 911 and notify the dispatch personnel that you have or may have COVID-19. 6. Cover your cough and sneezes with a tissue or use the inside of your elbow. 7. Wash your hands often with soap and water for at least 20 seconds or clean your hands with an alcohol-based hand sanitizer that contains at least 60% alcohol. 8. As much as possible, stay in a specific room and away from other people in your home. Also, you should use a separate bathroom, if available. If you need to be around other people in or outside of the home, wear a mask. 9. Avoid sharing personal items with other people in your household, like dishes, towels, and bedding. 10. Clean all surfaces that are touched often, like counters, tabletops, and doorknobs. Use household cleaning sprays or wipes according to the label instructions. cdc.gov/coronavirus 10/13/2018 This information is not intended to replace advice given to you by your health care provider. Make sure you discuss any questions you have with your health care provider. Document Revised: 03/17/2019 Document Reviewed: 03/17/2019 Elsevier Patient Education  2020 Elsevier Inc. What types of side effects do monoclonal antibody drugs cause?  Common side effects  In general, the more common side effects caused by monoclonal antibody drugs include: . Allergic reactions, such as hives or itching . Flu-like signs and  symptoms, including chills, fatigue, fever, and muscle aches and pains . Nausea, vomiting . Diarrhea . Skin rashes . Low blood pressure   The CDC is recommending patients who receive monoclonal antibody treatments wait at least 90 days before being vaccinated.  Currently, there are no data on the safety and efficacy of mRNA COVID-19 vaccines in persons who received monoclonal antibodies or convalescent plasma as part of COVID-19 treatment. Based on the estimated half-life of such therapies as well as evidence suggesting that reinfection is uncommon in the 90 days after initial infection, vaccination should be deferred for at least 90 days, as a precautionary measure until additional information becomes available, to avoid interference of the antibody treatment with vaccine-induced immune responses. If you have any questions or concerns after the infusion please call the Advanced Practice Provider on call at [phone]. This number is ONLY intended for your use regarding questions or concerns about the infusion post-treatment side-effects.  Please do not provide this number to others for use. For return to work notes please contact your primary care provider.   If someone you know is interested in receiving treatment please have them call the COVID hotline at [phone].   

## 2020-03-23 NOTE — Progress Notes (Signed)
Patient reviewed Fact Sheet for Patients, Parents, and Caregivers for Emergency Use Authorization (EUA) of REGEN-COV for the Treatment of Coronavirus. Patient also reviewed and is agreeable to the estimated cost of treatment. Patient is agreeable to proceed.    

## 2020-03-23 NOTE — Telephone Encounter (Signed)
Pt needs lisinopril refilled and sent to [facility]. Pt is currently out of medication.

## 2020-03-23 NOTE — Progress Notes (Signed)
  Diagnosis: COVID-19  Physician: Patrick Wright, MD  Procedure: [facility] Med: casirivimab\imdevimab infusion - Provided patient with casirivimab\imdevimab fact sheet for patients, parents and caregivers prior to infusion.  Complications: No immediate complications noted.  Discharge: Discharged home   Chase Clark 03/23/2020  

## 2020-04-11 ENCOUNTER — Telehealth: Payer: Self-pay

## 2020-04-11 ENCOUNTER — Encounter: Payer: Self-pay | Admitting: *Deleted

## 2020-04-11 DIAGNOSIS — Z951 Presence of aortocoronary bypass graft: Secondary | ICD-10-CM

## 2020-04-11 NOTE — Telephone Encounter (Signed)
Patient's wife called, patient has been out of rehab due to testing positive for COVID on 03/21/20. Verified patient had no symptoms and is welcome back on 04/16/20. Patient's wife aware.

## 2020-04-11 NOTE — Progress Notes (Signed)
Cardiac Individual Treatment Plan  Patient Details  Name: Chase Clark MRN: [id number] Date of Birth: Feb 17, 1966 Referring Provider:   Flowsheet Row Cardiac Rehab from 02/23/2020 in [facility] Cardiac and Pulmonary Rehab  Referring Provider Bartholome Bill MD      Initial Encounter Date:  Flowsheet Row Cardiac Rehab from 02/23/2020 in [facility] Cardiac and Pulmonary Rehab  Date 02/23/20      Visit Diagnosis: S/P CABG x 2  Patient's Home Medications on Admission:  Current Outpatient Medications:  .  acetaminophen (TYLENOL) 325 MG tablet, Take 1 tablet (325 mg total) by mouth every 6 (six) hours as needed for mild pain (pain score 1-3 or temp > 100.5)., Disp: 30 tablet, Rfl: 0 .  amLODipine (NORVASC) 5 MG tablet, , Disp: , Rfl:  .  aspirin (ASPIRIN CHILDRENS) 81 MG chewable tablet, Chew 1 tablet (81 mg total) by mouth daily., Disp: 30 tablet, Rfl: 1 .  atorvastatin (LIPITOR) 80 MG tablet, TAKE 1 TABLET BY MOUTH EVERY DAY, Disp: 90 tablet, Rfl: 1 .  brimonidine-timolol (COMBIGAN) 0.2-0.5 % ophthalmic solution, Place 1 drop into the right eye every 12 (twelve) hours. , Disp: , Rfl:  .  cholecalciferol (VITAMIN D3) 25 MCG (1000 UT) tablet, Take 1,000 Units by mouth daily., Disp: , Rfl:  .  Continuous Blood Gluc Sensor (DEXCOM G6 SENSOR) MISC, Inject 1 Device into the skin as directed. Place a new sensor every 10 days to check blood sugar at least 4 times daily, Disp: 9 each, Rfl: 3 .  Continuous Blood Gluc Transmit (DEXCOM G6 TRANSMITTER) MISC, Inject 1 Device into the skin as directed. Use to check blood sugar at least 4 times daily, Disp: 1 each, Rfl: 3 .  dorzolamide (TRUSOPT) 2 % ophthalmic solution, 1 drop 2 (two) times daily., Disp: , Rfl:  .  esomeprazole (NEXIUM) 20 MG capsule, Take 20 mg by mouth daily. , Disp: , Rfl:  .  ezetimibe (ZETIA) 10 MG tablet, Take 1 tablet (10 mg total) by mouth daily., Disp: 90 tablet, Rfl: 3 .  fluticasone (FLONASE) 50 MCG/ACT nasal spray, SPRAY 2 SPRAYS INTO  EACH NOSTRIL EVERY DAY, Disp: 48 mL, Rfl: 2 .  gabapentin (NEURONTIN) 300 MG capsule, Take 1 capsule (300 mg total) by mouth 2 (two) times daily., Disp: 60 capsule, Rfl: 6 .  hydrALAZINE (APRESOLINE) 25 MG tablet, Take by mouth., Disp: , Rfl:  .  Insulin Glargine (BASAGLAR KWIKPEN) 100 UNIT/ML, Inject 40 Units into the skin 2 (two) times daily. Further refills to come from endocrinology, Disp: 75 mL, Rfl: 3 .  insulin lispro (HUMALOG KWIKPEN) 100 UNIT/ML KwikPen, Inject 6 Units into the skin 3 (three) times daily. Inject up to 15 units TID with meals. Further refills to come from endocrinology, Disp: 30 mL, Rfl: 2 .  Insulin Pen Needle 32G X 4 MM MISC, Inject 5 pens into the skin daily. Use to inject Lantus and Novolog up to 5 times daily, Disp: 500 each, Rfl: 1 .  Lactulose 20 GM/30ML SOLN, Take 30 mLs (20 g total) by mouth as needed., Disp: 450 mL, Rfl: 1 .  latanoprost (XALATAN) 0.005 % ophthalmic solution, Place 1 drop into both eyes at bedtime., Disp: , Rfl: 5 .  lisinopril (ZESTRIL) 5 MG tablet, Take 1 tablet (5 mg total) by mouth daily., Disp: 90 tablet, Rfl: 1 .  metoprolol tartrate (LOPRESSOR) 100 MG tablet, TAKE 0.5 TABLETS (50 MG TOTAL) BY MOUTH 2 (TWO) TIMES DAILY., Disp: 90 tablet, Rfl: 1 .  senna (SENOKOT)  8.6 MG tablet, Take 1 tablet by mouth daily. , Disp: , Rfl:  .  Sodium Sulfate-Mag Sulfate-KCl (SUTAB) [phone] MG TABS, Use as directed for colonoscopy. MANUFACTURER CODES!! BIN: [id number] PCN: CN GROUP: [id number] MEMBER ID: [id number];[employer] AS SECONDARY INSURANCE ;NO PRIOR AUTHORIZATION, Disp: 24 tablet, Rfl: 0 .  torsemide (DEMADEX) 20 MG tablet, Take by mouth., Disp: , Rfl:   Past Medical History: Past Medical History:  Diagnosis Date  . Arthritis   . Asthma    as child  . Barrett's esophagus 02/16/2014   short segment  . Chronic kidney disease (CKD), stage IV (severe) ([place])   . Coronary artery disease    DES DIAG1 11/20/09 [facility])  . DDD (degenerative disc disease),  cervical   . Diabetes mellitus without complication ([place])   . Diabetic osteomyelitis ([place])   . Diabetic peripheral neuropathy ([place])   . Diabetic ulcer of foot with muscle involvement without evidence of necrosis ([place])    DIABETIC ULCERATIONS ASSOCIATED WITH IRRITATION LATERAL ANKLE LEFT GREATER THAN RIGHT WITH MILD CELLULITIS   . Diverticulosis   . DKA (diabetic ketoacidoses) 08/23/2017  . Edema, lower extremity   . Fatty liver   . Gastroparesis 2015  . GERD (gastroesophageal reflux disease)   . Gilbert's disease   . Glaucoma   . Heart disease 2011   Patient has stent for 80% blockage  . Hyperlipidemia   . Hypertension   . Left below-knee amputee ([place])   . Neuropathy   . Osteoarthritis of spine   . Osteomyelitis of left foot ([place])   . PVD (peripheral vascular disease) ([place])   . Seasonal allergies   . Shoulder pain   . Ulcer of foot ([place]) 08.06.2014   DIABETIC ULCERATIONS ASSOCIATED WITH IRRITATION LATERAL ANKLE LEFT GREATER THAN RIGHT WITH MILD CELLULITIS    Tobacco Use: Social History   Tobacco Use  Smoking Status Former Smoker  . Types: Cigarettes  . Quit date: 03/14/2008  . Years since quitting: 12.0  Smokeless Tobacco Never Used    Labs: Recent Review Flowsheet Data    Labs for ITP Cardiac and Pulmonary Rehab Latest Ref Rng & Units 01/27/2019 01/27/2019 05/24/2019 02/22/2020 03/15/2020   Cholestrol 0 - 200 mg/dL - - 126 - 161   LDLCALC 0 - 99 mg/dL - - 55 - -   LDLDIRECT mg/dL - - - - 75.0   HDL >39.00 mg/dL - - 31.70(L) - 34.10(L)   Trlycerides 0.0 - 149.0 mg/dL - - 199.0(H) - 372.0(H)   Hemoglobin A1c 4.0 - 5.6 % - - 5.9 9.0(A) -   PHART 7.350 - 7.450 7.356 7.330(L) - - -   PCO2ART 32.0 - 48.0 mmHg 42.9 44.0 - - -   HCO3 20.0 - 28.0 mmol/L 24.2 23.3 - - -   TCO2 22 - 32 mmol/L 26 25 - - -   ACIDBASEDEF 0.0 - 2.0 mmol/L 1.0 3.0(H) - - -   O2SAT % 98.0 93.0 - - -       Exercise Target Goals: Exercise Program Goal: Individual exercise prescription set using  results from initial 6 min walk test and THRR while considering  patient's activity barriers and safety.   Exercise Prescription Goal: Initial exercise prescription builds to 30-45 minutes a day of aerobic activity, 2-3 days per week.  Home exercise guidelines will be given to patient during program as part of exercise prescription that the participant will acknowledge.   Education: Aerobic Exercise: - Group verbal and visual presentation on the components of  exercise prescription. Introduces F.I.T.T principle from ACSM for exercise prescriptions.  Reviews F.I.T.T. principles of aerobic exercise including progression. Written material given at graduation.   Education: Resistance Exercise: - Group verbal and visual presentation on the components of exercise prescription. Introduces F.I.T.T principle from ACSM for exercise prescriptions  Reviews F.I.T.T. principles of resistance exercise including progression. Written material given at graduation.    Education: Exercise & Equipment Safety: - Individual verbal instruction and demonstration of equipment use and safety with use of the equipment. Flowsheet Row Cardiac Rehab from 03/14/2020 in [facility] Cardiac and Pulmonary Rehab  Date 02/23/20  Educator [facility]  Instruction Review Code 1- Verbalizes Understanding      Education: Exercise Physiology & General Exercise Guidelines: - Group verbal and written instruction with models to review the exercise physiology of the cardiovascular system and associated critical values. Provides general exercise guidelines with specific guidelines to those with heart or lung disease.  Flowsheet Row Cardiac Rehab from 03/14/2020 in [facility] Cardiac and Pulmonary Rehab  Education need identified 02/23/20      Education: Flexibility, Balance, Mind/Body Relaxation: - Group verbal and visual presentation with interactive activity on the components of exercise prescription. Introduces F.I.T.T principle from ACSM for exercise  prescriptions. Reviews F.I.T.T. principles of flexibility and balance exercise training including progression. Also discusses the mind body connection.  Reviews various relaxation techniques to help reduce and manage stress (i.e. Deep breathing, progressive muscle relaxation, and visualization). Balance handout provided to take home. Written material given at graduation.   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 HR 86 bpm     Max Ex. BP 174/76     2 Minute Post BP 128/64            Oxygen Initial Assessment:   Oxygen Re-Evaluation:   Oxygen Discharge (Final Oxygen Re-Evaluation):   Initial Exercise Prescription:  Initial Exercise Prescription - 02/23/20 1000      Date of Initial Exercise RX and Referring Provider   Date 02/23/20    Referring Provider Bartholome Bill MD      Treadmill   MPH 2    Grade 0.5    Minutes 15    METs 2.67      NuStep   Level 2    SPM  80    Minutes 15    METs 2      T5 Nustep   Level 2    SPM 80    Minutes 15    METs 2      Biostep-RELP   Level 2    SPM 50    Minutes  15    METs 2      Prescription Details   Frequency (times per week) 3    Duration Progress to 30 minutes of continuous aerobic without signs/symptoms of physical distress      Intensity   THRR 40-80% of Max Heartrate 108-145    Ratings of Perceived Exertion 11-13    Perceived Dyspnea 0-4      Progression   Progression Continue to progress workloads to maintain intensity without signs/symptoms of physical distress.      Resistance Training   Training Prescription Yes    Weight 3 lb    Reps 10-15           Perform Capillary Blood Glucose checks as needed.  Exercise Prescription Changes:  Exercise Prescription Changes    Row Name 02/23/20 1000 03/05/20 1700 03/21/20 0900         Response to Exercise   Blood Pressure (Admit) 126/64 128/70 148/86     Blood Pressure (Exercise) 174/76 138/66 152/74     Blood Pressure (Exit) 128/74 140/80 142/72     Heart Rate (Admit) 70 bpm 75 bpm 71 bpm     Heart Rate (Exercise) 86 bpm 89 bpm 91 bpm     Heart Rate (Exit) 69 bpm 78 bpm 71 bpm     Oxygen Saturation (Admit) 99 % -- --     Oxygen Saturation (Exercise) 98 % -- --     Rating of Perceived Exertion (Exercise) 13 13 13      Perceived Dyspnea (Exercise) 2 -- --     Symptoms hips sore (2/10), fatigue, SOB none none     Comments walk test results -- --     Duration -- -- Continue with 30 min of aerobic exercise without signs/symptoms of physical distress.     Intensity -- -- THRR unchanged           Progression   Progression -- Continue to progress workloads to maintain intensity without signs/symptoms of physical distress. Continue to progress workloads to maintain intensity without signs/symptoms of physical distress.     Average METs -- 3.6 2.54           Resistance Training   Training Prescription -- Yes Yes     Weight -- 5 lb 5 lb     Reps -- 10-15 10-15           Interval Training   Interval Training -- -- No           Treadmill   MPH -- -- 2     Grade -- -- 0.5      Minutes -- -- 15     METs -- -- 2.53           NuStep   Level -- 2 --     SPM -- 80 --     Minutes -- 15 --     METs -- 4.2 --           T5 Nustep   Level -- -- 2     Minutes -- -- 15     METs -- -- 2.4  Biostep-RELP   Level -- 3 --     SPM -- 50 --     Minutes -- 15 --     METs -- 3 --            Exercise Comments:  Exercise Comments    Row Name 02/24/20 0818 03/05/20 1761         Exercise Comments First full day of exercise!  Patient was oriented to gym and equipment including functions, settings, policies, and procedures.  Patient's individual exercise prescription and treatment plan were reviewed.  All starting workloads were established based on the results of the 6 minute walk test done at initial orientation visit.  The plan for exercise progression was also introduced and progression will be customized based on patient's performance and goals. Weight up 7 pounds from last visit. Danniel stated he did feel some fluid. He will go home and discuss with his wife about takng an extra lasix. He has new kidney doctor appointment this Wednesday             Exercise Goals and Review:  Exercise Goals    Row Name 02/23/20 1018             Exercise Goals   Increase Physical Activity Yes       Intervention Provide advice, education, support and counseling about physical activity/exercise needs.;Develop an individualized exercise prescription for aerobic and resistive training based on initial evaluation findings, risk stratification, comorbidities and participant's personal goals.       Expected Outcomes Short Term: Attend rehab on a regular basis to increase amount of physical activity.;Long Term: Add in home exercise to make exercise part of routine and to increase amount of physical activity.;Long Term: Exercising regularly at least 3-5 days a week.       Increase Strength and Stamina Yes       Intervention Provide advice, education, support and counseling  about physical activity/exercise needs.;Develop an individualized exercise prescription for aerobic and resistive training based on initial evaluation findings, risk stratification, comorbidities and participant's personal goals.       Expected Outcomes Short Term: Increase workloads from initial exercise prescription for resistance, speed, and METs.;Short Term: Perform resistance training exercises routinely during rehab and add in resistance training at home;Long Term: Improve cardiorespiratory fitness, muscular endurance and strength as measured by increased METs and functional capacity (6MWT)       Able to understand and use rate of perceived exertion (RPE) scale Yes       Intervention Provide education and explanation on how to use RPE scale       Expected Outcomes Short Term: Able to use RPE daily in rehab to express subjective intensity level;Long Term:  Able to use RPE to guide intensity level when exercising independently       Able to understand and use Dyspnea scale Yes       Intervention Provide education and explanation on how to use Dyspnea scale       Expected Outcomes Short Term: Able to use Dyspnea scale daily in rehab to express subjective sense of shortness of breath during exertion;Long Term: Able to use Dyspnea scale to guide intensity level when exercising independently       Knowledge and understanding of Target Heart Rate Range (THRR) Yes       Intervention Provide education and explanation of THRR including how the numbers were predicted and where they are located for reference       Expected Outcomes  Short Term: Able to state/look up THRR;Short Term: Able to use daily as guideline for intensity in rehab;Long Term: Able to use THRR to govern intensity when exercising independently       Able to check pulse independently Yes       Intervention Provide education and demonstration on how to check pulse in carotid and radial arteries.;Review the importance of being able to check your  own pulse for safety during independent exercise       Expected Outcomes Short Term: Able to explain why pulse checking is important during independent exercise;Long Term: Able to check pulse independently and accurately       Understanding of Exercise Prescription Yes       Intervention Provide education, explanation, and written materials on patient's individual exercise prescription       Expected Outcomes Short Term: Able to explain program exercise prescription;Long Term: Able to explain home exercise prescription to exercise independently              Exercise Goals Re-Evaluation :  Exercise Goals Re-Evaluation    Row Name 02/24/20 0818 03/05/20 1711 03/21/20 0901         Exercise Goal Re-Evaluation   Exercise Goals Review Understanding of Exercise Prescription;Knowledge and understanding of Target Heart Rate Range (THRR);Able to understand and use Dyspnea scale;Able to understand and use rate of perceived exertion (RPE) scale Increase Physical Activity;Increase Strength and Stamina Increase Physical Activity;Increase Strength and Stamina;Understanding of Exercise Prescription     Comments Reviewed RPE and dyspnea scales, THR and program prescription with pt today.  Pt voiced understanding and was given a copy of goals to take home. Shyhiem has tolerated exercise well in his first week.  Staff will monitor progress. Nathaneal has tested positive for COVID and will be out.  He was up to 2.4 METs on the T5 NuStep.  We will continue to monitor his progress upon return.     Expected Outcomes Short: Use RPE daily to regulate intensity. Long: Follow program prescription in THR. Short: attend consistently Long: increase overall stamina Short; Return once cleared to regular attendance  Long: Continue to improve stamina.            Discharge Exercise Prescription (Final Exercise Prescription Changes):  Exercise Prescription Changes - 03/21/20 0900      Response to Exercise   Blood Pressure  (Admit) 148/86    Blood Pressure (Exercise) 152/74    Blood Pressure (Exit) 142/72    Heart Rate (Admit) 71 bpm    Heart Rate (Exercise) 91 bpm    Heart Rate (Exit) 71 bpm    Rating of Perceived Exertion (Exercise) 13    Symptoms none    Duration Continue with 30 min of aerobic exercise without signs/symptoms of physical distress.    Intensity THRR unchanged      Progression   Progression Continue to progress workloads to maintain intensity without signs/symptoms of physical distress.    Average METs 2.54      Resistance Training   Training Prescription Yes    Weight 5 lb    Reps 10-15      Interval Training   Interval Training No      Treadmill   MPH 2    Grade 0.5    Minutes 15    METs 2.53      T5 Nustep   Level 2    Minutes 15    METs 2.4  Nutrition:  Target Goals: Understanding of nutrition guidelines, daily intake of sodium 1500mg , cholesterol 200mg , calories 30% from fat and 7% or less from saturated fats, daily to have 5 or more servings of fruits and vegetables.  Education: All About Nutrition: -Group instruction provided by verbal, written material, interactive activities, discussions, models, and posters to present general guidelines for heart healthy nutrition including fat, fiber, MyPlate, the role of sodium in heart healthy nutrition, utilization of the nutrition label, and utilization of this knowledge for meal planning. Follow up email sent as well. Written material given at graduation. Flowsheet Row Cardiac Rehab from 03/14/2020 in [facility] Cardiac and Pulmonary Rehab  Education need identified 02/23/20      Biometrics:  Pre Biometrics - 02/23/20 1019      Pre Biometrics   Height 5\' 11"  (1.803 m)    Weight 231 lb 1.6 oz (104.8 kg)    BMI (Calculated) 32.25    Single Leg Stand 0.5 seconds   only did good leg           Nutrition Therapy Plan and Nutrition Goals:  Nutrition Therapy & Goals - 02/23/20 1025      Intervention Plan    Intervention Prescribe, educate and counsel regarding individualized specific dietary modifications aiming towards targeted core components such as weight, hypertension, lipid management, diabetes, heart failure and other comorbidities.    Expected Outcomes Short Term Goal: Understand basic principles of dietary content, such as calories, fat, sodium, cholesterol and nutrients.;Short Term Goal: A plan has been developed with personal nutrition goals set during dietitian appointment.;Long Term Goal: Adherence to prescribed nutrition plan.           Nutrition Assessments:  Nutrition Assessments - 02/23/20 1023      MEDFICTS Scores   Pre Score 39          MEDIFICTS Score Key:  ?70 Need to make dietary changes   40-70 Heart Healthy Diet  ? 40 Therapeutic Level Cholesterol Diet   Picture Your Plate Scores:  <53 Unhealthy dietary pattern with much room for improvement.  41-50 Dietary pattern unlikely to meet recommendations for good health and room for improvement.  51-60 More healthful dietary pattern, with some room for improvement.   >60 Healthy dietary pattern, although there may be some specific behaviors that could be improved.    Nutrition Goals Re-Evaluation:   Nutrition Goals Discharge (Final Nutrition Goals Re-Evaluation):   Psychosocial: Target Goals: Acknowledge presence or absence of significant depression and/or stress, maximize coping skills, provide positive support system. Participant is able to verbalize types and ability to use techniques and skills needed for reducing stress and depression.   Education: Stress, Anxiety, and Depression - Group verbal and visual presentation to define topics covered.  Reviews how body is impacted by stress, anxiety, and depression.  Also discusses healthy ways to reduce stress and to treat/manage anxiety and depression.  Written material given at graduation.   Education: Sleep Hygiene -Provides group verbal and written  instruction about how sleep can affect your health.  Define sleep hygiene, discuss sleep cycles and impact of sleep habits. Review good sleep hygiene tips.    Initial Review & Psychosocial Screening:  Initial Psych Review & Screening - 02/10/20 1018      Initial Review   Current issues with Current Stress Concerns    Source of Stress Concerns Chronic Illness;Occupation;Unable to participate in former interests or hobbies;Unable to perform yard/household activities      Family Dynamics   Good Support  System? Yes   wife     Barriers   Psychosocial barriers to participate in program There are no identifiable barriers or psychosocial needs.;The patient should benefit from training in stress management and relaxation.      Screening Interventions   Interventions Encouraged to exercise;Provide feedback about the scores to participant;To provide support and resources with identified psychosocial needs    Expected Outcomes Short Term goal: Utilizing psychosocial counselor, staff and physician to assist with identification of specific Stressors or current issues interfering with healing process. 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  A difference of  2-3 points is a clinically meaningful difference.  A difference of 2-3 points in the total  score of the Quality of Life Index has been associated with significant improvement in overall quality of life, self-image, physical symptoms, and general health in studies assessing change in quality of life.  PHQ-9: Recent Review Flowsheet Data    Depression screen [facility] 2/9 02/23/2020 02/22/2019 11/19/2018 12/28/2017 09/25/2017   Decreased Interest 0 0 0 0 0   Down, Depressed, Hopeless 0 0 0 0 0   PHQ - 2 Score 0 0 0 0 0   Altered sleeping 0 - - - -   Tired, decreased energy 2 - - - -   Change in appetite 0 - - - -   Feeling bad or failure about yourself  0 - - - -   Trouble concentrating 0 - - - -   Moving slowly or fidgety/restless 0 - - - -   Suicidal thoughts 0 - - - -   PHQ-9 Score 2 - - - -   Difficult doing work/chores Not difficult at all - - - -     Interpretation of Total Score  Total Score Depression Severity:  1-4 = Minimal depression, 5-9 = Mild depression, 10-14 = Moderate depression, 15-19 = Moderately severe depression, 20-27 = Severe depression   Psychosocial Evaluation and Intervention:  Psychosocial Evaluation - 02/10/20 1019      Psychosocial Evaluation & Interventions   Interventions Stress management education;Relaxation education;Encouraged to exercise with the program and follow exercise prescription    Comments Donell is doing well post CABG from 01/2019. He was unable to attend cardiac rehab due to COVID, but is looking forward to starting it now. 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 He admits to being more sedentary this last year due to [place] so he is really wanting to boost his stamina through exercise. He is currently on long term disability from work and doesn't know if he can go back due to his worsening vision. Insurance  has also been giving him issues with his diabetes medication but he hopes to resolve it soon.    Expected Outcomes Short: attend cardiac rehab for  education and exercise. Long: develop positive self care habits.    Continue Psychosocial Services  Follow up required by staff           Psychosocial Re-Evaluation:   Psychosocial Discharge (Final Psychosocial Re-Evaluation):   Vocational Rehabilitation: Provide vocational rehab assistance to qualifying candidates.   Vocational Rehab Evaluation & Intervention:  Vocational Rehab - 02/10/20 1017      Initial Vocational Rehab Evaluation & Intervention   Assessment shows need for Vocational Rehabilitation No           Education: Education Goals: Education classes will be provided on a variety of topics geared toward better understanding of heart health and risk factor modification. Participant will state understanding/return demonstration of topics presented as noted by education test scores.  Learning Barriers/Preferences:  Learning Barriers/Preferences - 02/10/20 1016      Learning Barriers/Preferences   Learning Barriers Sight    Learning Preferences Audio;Group Instruction;Individual Instruction;Pictoral;Skilled Demonstration;Verbal Instruction           General Cardiac Education Topics:  AED/CPR: - Group verbal and written instruction with the use of models to demonstrate the basic use of the AED with the basic ABC's of resuscitation.   Anatomy and Cardiac Procedures: - Group verbal and visual presentation and models provide information about basic cardiac anatomy and function. Reviews the testing methods done to diagnose heart disease and the outcomes of the test results. Describes the treatment choices: Medical Management, Angioplasty, or Coronary Bypass Surgery for treating various heart conditions including Myocardial Infarction, Angina, Valve Disease, and Cardiac Arrhythmias.  Written material given at graduation.   Medication Safety: - Group verbal and visual instruction to review commonly prescribed medications for heart and lung disease. Reviews the  medication, class of the drug, and side effects. Includes the steps to properly store meds and maintain the prescription regimen.  Written material given at graduation. Flowsheet Row Cardiac Rehab from 03/14/2020 in [facility] Cardiac and Pulmonary Rehab  Date 02/29/20  Educator SB  Instruction Review Code 1- Verbalizes Understanding      Intimacy: - Group verbal instruction through game format to discuss how heart and lung disease can affect sexual intimacy. Written material given at graduation..   Know Your Numbers and Heart Failure: - Group verbal and visual instruction to discuss disease risk factors for cardiac and pulmonary disease and treatment options.  Reviews associated critical values for Overweight/Obesity, Hypertension, Cholesterol, and Diabetes.  Discusses basics of heart failure: signs/symptoms and treatments.  Introduces Heart Failure Zone chart for action plan for heart failure.  Written material given at graduation.   Infection Prevention: - Provides verbal and written material to individual with discussion of infection control including proper hand washing and proper equipment cleaning during exercise session. Flowsheet Row Cardiac Rehab from 03/14/2020 in [facility] Cardiac and Pulmonary Rehab  Date 02/23/20  Educator [facility]  Instruction Review Code 1- Verbalizes Understanding      Falls Prevention: - Provides verbal and written material to individual with discussion of falls prevention and safety. Flowsheet Row Cardiac Rehab from 03/14/2020 in [facility] Cardiac and Pulmonary Rehab  Date 02/23/20  Educator [facility]  Instruction Review Code 1- Verbalizes Understanding      Other: -Provides group and verbal instruction on various topics (see comments)   Knowledge Questionnaire Score:  Knowledge Questionnaire Score - 02/23/20 1024  Knowledge Questionnaire Score   Pre Score 23/26 Education Focus: Nutrition and Exercise           Core Components/Risk Factors/Patient Goals at  Admission:  Personal Goals and Risk Factors at Admission - 02/23/20 1025      Core Components/Risk Factors/Patient Goals on Admission    Weight Management Yes;Weight Loss    Intervention Weight Management: Develop a combined nutrition and exercise program designed to reach desired caloric intake, while maintaining appropriate intake of nutrient and fiber, sodium and fats, and appropriate energy expenditure required for the weight goal.;Weight Management: Provide education and appropriate resources to help participant work on and attain dietary goals.;Weight Management/Obesity: Establish reasonable short term and long term weight goals.;Obesity: Provide education and appropriate resources to help participant work on and attain dietary goals.    Admit Weight 231 lb 1.6 oz (104.8 kg)    Goal Weight: Short Term 225 lb (102.1 kg)    Goal Weight: Long Term 220 lb (99.8 kg)    Expected Outcomes Short Term: Continue to assess and modify interventions until short term weight is achieved;Long Term: Adherence to nutrition and physical activity/exercise program aimed toward attainment of established weight goal;Understanding recommendations for meals to include 15-35% energy as protein, 25-35% energy from fat, 35-60% energy from carbohydrates, less than 200mg  of dietary cholesterol, 20-35 gm of total fiber daily;Weight Loss: Understanding of general recommendations for a balanced deficit meal plan, which promotes 1-2 lb weight loss per week and includes a negative energy balance of [phone] kcal/d;Understanding of distribution of calorie intake throughout the day with the consumption of 4-5 meals/snacks    Diabetes Yes    Intervention Provide education about signs/symptoms and action to take for hypo/hyperglycemia.;Provide education about proper nutrition, including hydration, and aerobic/resistive exercise prescription along with prescribed medications to achieve blood glucose in normal ranges: Fasting glucose 65-99  mg/dL    Expected Outcomes Short Term: Participant verbalizes understanding of the signs/symptoms and immediate care of hyper/hypoglycemia, proper foot care and importance of medication, aerobic/resistive exercise and nutrition plan for blood glucose control.;Long Term: Attainment of HbA1C < 7%.    Hypertension Yes    Intervention Provide education on lifestyle modifcations including regular physical activity/exercise, weight management, moderate sodium restriction and increased consumption of fresh fruit, vegetables, and low fat dairy, alcohol moderation, and smoking cessation.;Monitor prescription use compliance.    Expected Outcomes Short Term: Continued assessment and intervention until BP is < 140/68mm HG in hypertensive participants. < 130/48mm HG in hypertensive participants with diabetes, heart failure or chronic kidney disease.;Long Term: Maintenance of blood pressure at goal levels.    Lipids Yes    Intervention Provide education and support for participant on nutrition & aerobic/resistive exercise along with prescribed medications to achieve LDL 70mg , HDL >40mg .    Expected Outcomes Short Term: Participant states understanding of desired cholesterol values and is compliant with medications prescribed. Participant is following exercise prescription and nutrition guidelines.;Long Term: Cholesterol controlled with medications as prescribed, with individualized exercise RX and with personalized nutrition plan. Value goals: LDL < 70mg , HDL > 40 mg.           Education:Diabetes - Individual verbal and written instruction to review signs/symptoms of diabetes, desired ranges of glucose level fasting, after meals and with exercise. Acknowledge that pre and post exercise glucose checks will be done for 3 sessions at entry of program. [place] from 03/14/2020 in [facility] Cardiac and Pulmonary Rehab  Date 02/10/20  Educator [facility]  Instruction Review Code  1- Verbalizes Understanding       Core Components/Risk Factors/Patient Goals Review:    Core Components/Risk Factors/Patient Goals at Discharge (Final Review):    ITP Comments:  ITP Comments    Row Name 02/10/20 [phone] 02/23/20 1005 02/24/20 0817 03/05/20 0821 03/14/20 0948   ITP Comments Initial telephone orientation completed. Diagnosis can be found in [facility] 10/7. EP orientation scheduled for 11/2 at 8am Completed 6MWT and gym orientation. Initial ITP created and sent for review to Dr. Emily Filbert, Medical Director. First full day of exercise!  Patient was oriented to gym and equipment including functions, settings, policies, and procedures.  Patient's individual exercise prescription and treatment plan were reviewed.  All starting workloads were established based on the results of the 6 minute walk test done at initial orientation visit.  The plan for exercise progression was also introduced and progression will be customized based on patient's performance and goals. Weight up 7 pounds from last visit. Van stated he did feel some fluid. He will go home and discuss with his wife about takng an extra lasix. He has new kidney doctor appointment this Wednesday 30 Day review completed. Medical Director ITP review done, changes made as directed, and signed approval by Medical Director.   Row Name 03/19/20 1213 04/04/20 1250 04/11/20 0558       ITP Comments Pt's wife left message.  Tylik has tested positive for COVID.  Message said he would be back next week.  Left message for them that we need test dates to verify return dates. Tae has been absent since last review. 30 Day review completed. Medical Director ITP review done, changes made as directed, and signed approval by Medical Director.            Comments:

## 2020-04-12 ENCOUNTER — Ambulatory Visit: Payer: Managed Care, Other (non HMO) | Admitting: Pharmacist

## 2020-04-12 DIAGNOSIS — E785 Hyperlipidemia, unspecified: Secondary | ICD-10-CM

## 2020-04-12 DIAGNOSIS — I10 Essential (primary) hypertension: Secondary | ICD-10-CM

## 2020-04-12 DIAGNOSIS — N184 Chronic kidney disease, stage 4 (severe): Secondary | ICD-10-CM

## 2020-04-12 DIAGNOSIS — I2510 Atherosclerotic heart disease of native coronary artery without angina pectoris: Secondary | ICD-10-CM

## 2020-04-12 DIAGNOSIS — E103593 Type 1 diabetes mellitus with proliferative diabetic retinopathy without macular edema, bilateral: Secondary | ICD-10-CM

## 2020-04-12 NOTE — Chronic Care Management (AMB) (Signed)
Care Management   Pharmacy Note  04/12/2020 Name: Chase Clark MRN: [id number] DOB: 04/06/1966  Chase Clark is a 54 y.o. year old male who is a primary care patient of [name], [name], MD. The Care Management/Care Coordination team team was consulted for assistance with care management and care coordination needs.    Engaged with patient Engaged with patient's wife, Chase Clark (on [place]) by telephone for follow up visit in response to provider referral for pharmacy case management and/or care coordination services.   Consent to Services:  Chase Clark was given information about care management/care coordination services, agreed to services, and gave verbal consent prior to initiation of services. Please see initial visit note for detailed documentation.   Review of patient status, including review of consultants reports, laboratory and other test data, was performed as part of comprehensive evaluation and provision of chronic care management services.   SDOH (Social Determinants of Health) assessments and interventions performed:  none today  Objective:  Lab Results  Component Value Date   CREATININE 4.13 (H) 03/15/2020   CREATININE 2.70 (H) 05/24/2019   CREATININE 2.83 (H) 03/09/2019    Lab Results  Component Value Date   HGBA1C 9.0 (A) 02/22/2020       Component Value Date/Time   CHOL 161 03/15/2020 0830   TRIG 372.0 (H) 03/15/2020 0830   HDL 34.10 (L) 03/15/2020 0830   CHOLHDL 5 03/15/2020 0830   VLDL 74.4 (H) 03/15/2020 0830   LDLCALC 55 05/24/2019 0905   LDLDIRECT 75.0 03/15/2020 0830     BP Readings from Last 3 Encounters:  03/23/20 (!) 171/109  03/15/20 (!) 170/90  02/22/20 140/82    Care Plan  Allergies  Allergen Reactions  . Ozempic (0.25 Or 0.5 Mg-Dose) [Semaglutide(0.25 Or 0.5mg -Dos)] Nausea And Vomiting and Other (See Comments)    Triggered occular migraines    Medications Reviewed Today    Reviewed by De Hollingshead, RPH-CPP (Pharmacist)  on 04/12/20 at [phone]  Med List Status: <None>  Medication Order Taking? Sig Documenting Provider Last Dose Status Informant  acetaminophen (TYLENOL) 325 MG tablet [id number] Yes Take 1 tablet (325 mg total) by mouth every 6 (six) hours as needed for mild pain (pain score 1-3 or temp > 100.5). Grier Mitts, PA-C Taking Active Spouse/Significant Other  amLODipine (NORVASC) 5 MG tablet [id number] Yes Take 2.5 mg by mouth daily. [provider] Taking Active   aspirin (ASPIRIN CHILDRENS) 81 MG chewable tablet [id number] Yes Chew 1 tablet (81 mg total) by mouth daily. Lajuana Matte, MD Taking Active   atorvastatin (LIPITOR) 80 MG tablet [id number] Yes TAKE 1 TABLET BY MOUTH EVERY DAY [name], MD Taking Active   brimonidine-timolol (COMBIGAN) 0.2-0.5 % ophthalmic solution [id number] Yes Place 1 drop into the right eye every 12 (twelve) hours.  [provider] Taking Active Spouse/Significant Other           Med Note Chanetta Marshall Feb 14, 2019  9:49 AM)    cholecalciferol (VITAMIN D3) 25 MCG (1000 UT) tablet [id number] Yes Take 1,000 Units by mouth daily. [provider] Taking Active   Continuous Blood Gluc Sensor (DEXCOM G6 SENSOR) MISC [id number] Yes Inject 1 Device into the skin as directed. Place a new sensor every 10 days to check blood sugar at least 4 times daily Shamleffer, Melanie Crazier, MD Taking Active   Continuous Blood Gluc Transmit (DEXCOM G6 TRANSMITTER) MISC [id number] Yes Inject 1 Device into the skin as directed. Use  to check blood sugar at least 4 times daily Shamleffer, Melanie Crazier, MD Taking Active   dorzolamide (TRUSOPT) 2 % ophthalmic solution [id number] Yes 1 drop 2 (two) times daily. [provider] Taking Active   ezetimibe (ZETIA) 10 MG tablet [id number] Yes Take 1 tablet (10 mg total) by mouth daily. [name], MD Taking Active   fluticasone [facility]) 50 MCG/ACT nasal spray [id number] Yes SPRAY 2 SPRAYS  INTO EACH NOSTRIL EVERY DAY [name], MD Taking Active   gabapentin (NEURONTIN) 300 MG capsule [id number] Yes Take 1 capsule (300 mg total) by mouth 2 (two) times daily. [name], MD Taking Active   hydrALAZINE (APRESOLINE) 25 MG tablet [id number] Yes Take 12.5 mg by mouth in the morning and at bedtime. [provider] Taking Active   Insulin Glargine [facility] KWIKPEN) 100 UNIT/ML [id number] Yes Inject 40 Units into the skin 2 (two) times daily. Further refills to come from endocrinology Shamleffer, Melanie Crazier, MD Taking Active   insulin lispro (HUMALOG KWIKPEN) 100 UNIT/ML KwikPen [id number] Yes Inject 6 Units into the skin 3 (three) times daily. Inject up to 15 units TID with meals. Further refills to come from endocrinology Shamleffer, Melanie Crazier, MD Taking Active            Med Note Nat Christen Apr 12, 2020  9:20 AM) 6-8 units with meals   Insulin Clark Needle 32G X 4 MM MISC [id number] Yes Inject 5 pens into the skin daily. Use to inject Lantus and Novolog up to 5 times daily Shamleffer, Melanie Crazier, MD Taking Active   Lactulose 20 GM/30ML SOLN [id number] No Take 30 mLs (20 g total) by mouth as needed.  Patient not taking: Reported on 04/12/2020   Lajuana Matte, MD Not Taking Active   latanoprost (XALATAN) 0.005 % ophthalmic solution [id number] Yes Place 1 drop into both eyes at bedtime. [provider] Taking Active Spouse/Significant Other           Med Note Chanetta Marshall Feb 14, 2019  9:49 AM)    lisinopril (ZESTRIL) 5 MG tablet [id number] Yes Take 1 tablet (5 mg total) by mouth daily. [name], MD Taking Active   metoprolol tartrate (LOPRESSOR) 100 MG tablet [id number] Yes TAKE 0.5 TABLETS (50 MG TOTAL) BY MOUTH 2 (TWO) TIMES DAILY. [name], MD Taking Active   omeprazole (PRILOSEC) 20 MG capsule [id number] Yes Take 20 mg by mouth daily. [provider] Taking Active   senna (SENOKOT)  8.6 MG tablet [id number] Yes Take 1 tablet by mouth daily.  [provider] Taking Active   Sodium Sulfate-Mag Sulfate-KCl ([place]) [phone] MG TABS [id number]  Use as directed for colonoscopy. MANUFACTURER CODES!! BIN: [id number] PCN: CN GROUP: [id number] MEMBER ID: [id number];QJJ AS SECONDARY INSURANCE ;NO PRIOR AUTHORIZATION Pyrtle, Lajuan Lines, MD  Active   torsemide (DEMADEX) 20 MG tablet [id number] Yes Take 20 mg by mouth. [provider] Taking Active           Patient Active Problem List   Diagnosis Date Noted  . Bloating 03/02/2020  . 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 Type 1 diabetes mellitus with stage 4 chronic kidney disease ([place]) 06/30/2019  . Type 1 diabetes mellitus with diabetic polyneuropathy ([place]) 06/30/2019  . Diabetes mellitus type I ([place]) 06/30/2019  . Left below-knee amputee ([place]) 06/30/2019  . Dyslipidemia 06/30/2019  . Swelling of limb 06/07/2019  .  Vision changes 02/22/2019  . 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 Nephrotic syndrome 01/19/2019  . Allergic rhinitis 07/19/2018  . Amputation of left lower extremity below knee ([place]) 09/09/2017  . Benign prostatic hyperplasia   . Hyperlipidemia   . Chronic kidney disease (CKD), stage IV (severe) ([place])   . Neuropathic pain   . Coronary artery disease involving native coronary artery of native heart without angina pectoris   . Anemia of chronic disease   . Cervical spondylosis with myelopathy and radiculopathy 05/07/2017  . Fatty liver 04/04/2017  . DDD (degenerative disc disease), cervical 01/13/2017  . PAD (peripheral artery disease) ([place]) 08/07/2016  . Risk for falls 07/28/2016  . Diabetic Charcot foot ([place]) 07/21/2016  . Nondisplaced fracture of left calcaneus 07/10/2016  . Wound infection 07/10/2016  . Acute osteomyelitis of right foot ([place]) 07/03/2016  . Diabetic peripheral neuropathy ([place]) 07/03/2016  . Chronic diarrhea 05/17/2016  . Peripheral  vascular disease of extremity with claudication ([place]) 09/12/2015  . Anemia 01/12/2014  . Disorder of ligament of ankle 07/31/2013  . Edema 06/28/2013  . 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 Obesity (BMI 30-39.9) 06/12/2013  . Pain in joint, shoulder region 06/12/2013  . Statin intolerance 06/10/2013  . GILBERT'S SYNDROME 07/17/2006  . Essential hypertension 07/17/2006  . Coronary atherosclerosis 07/17/2006  . OSTEOARTHRITIS 07/17/2006    Conditions to be addressed/monitored per PCP order: diabetes, CKD, HTN, HLD  Patient Care Plan: Medication Management    Problem Identified: Diabetes, CAD (hx CABG, HTN), CKD     Long-Range Goal: Disease Progression Prevention   This Visit's Progress: On track  Priority: High  Note:   Current Barriers:  . Unable to achieve control of diabetes, hypertension   Pharmacist Clinical Goal(s):  Marland Kitchen Over the next 90 days, patient will achieve adherence to monitoring guidelines and medication adherence to achieve therapeutic efficacy through collaboration with PharmD and provider.  .   Interventions: . 1:1 collaboration with [name], MD regarding development and update of comprehensive plan of care as evidenced by provider attestation and co-signature . Inter-disciplinary care team collaboration (see longitudinal plan of care) . Comprehensive medication review performed; medication list updated in electronic medical record  Diabetes: . Uncontrolled; current treatment: Lantus 40 units BID, Novolog per sliding scale, 12-14 units usually  . Current glucose readings: utilizing dexcom CGM. Communicating with endocrinology. Wife reports "random spikes" of glucose that do not correspond with meals.  . Peripheral neuropathy: gabapentin 300 mg BID . Denies hypoglycemic symptoms since dose reduction of Lantus per endocrinology . Encouraged continued collaboration and communication with endocrinology regarding glucose readings and  concerns.   Hypertension with CKD: . Uncontrolled; current treatment: lisinopril 5 mg daily, hydralazine 12.5 mg BID, metoprolol tartrate 50 mg BID, amlodipine 2.5 mg daily - Chase Clark reports that nephrology had discontinued amlodipine (likely d/t LEE), but Terra restarted d/t high BP readings at home. Torsemide 20 mg daily added by nephrology, Chase Clark reports this has reduced LEE and patient is tolerating . Current home readings: ~140/80s . Encouraged Terra to communicate home readings, as well as self-restart of amlodipine, to nephrology for further instruction.   Hyperlipidemia: . Uncontrolled but anticipated to be improved; current treatment: atorvastatin 80 mg daily, ezetimibe 10 mg daily recently added  . Recommend to continue current regimen at this time. Repeat lipid panel expected with next PCP visit  Allergies, s/p COVID: . Moderately well controlled per Terra's report; current using fluticasone nasal spray PRN . Recommend to continue current  regimen at this time.   GI (GERD hx Barrett's, gastroparesis, family hx NASH): . Appropriately managed per current regimen: omeprazole 20 mg daily for GERD; dietary discussions w/ Dr. Hilarie Fredrickson regarding gastroparesis, discussion of metoclopramide but was not prescribed. Senna PRN constipation, wife reports patient has not needed often recently . Recommend to continue current regimen with dietary modifications at this time.    Patient Goals/Self-Care Activities . Over the next 90 days, patient will:  - take medications as prescribed check glucose TID using CGM, document, and provide at future appointments check blood pressure daily, document, and provide at future appointments Communicate with specialty providers  Follow Up Plan: Telephone follow up appointment with care management team member scheduled for: ~ 4 weeks - offered to cease care management services, patient's wife requests that we continue collaboration     Medication Assistance:  None required. Patient affirms current coverage meets needs.   Plan: Telephone follow up appointment with care management team member scheduled for: ~ 5 weeks per wife's request to continue care management services  Catie Darnelle Maffucci, PharmD, [place], [place] Clinical Pharmacist [employer] at [facility] [phone]

## 2020-04-12 NOTE — Patient Instructions (Signed)
Visit Information  Patient Care Plan: Medication Management    Problem Identified: Diabetes, CAD (hx CABG, HTN), CKD     Long-Range Goal: Disease Progression Prevention   This Visit's Progress: On track  Priority: High  Note:   Current Barriers:  . Unable to achieve control of diabetes, hypertension   Pharmacist Clinical Goal(s):  Marland Kitchen Over the next 90 days, patient will achieve adherence to monitoring guidelines and medication adherence to achieve therapeutic efficacy through collaboration with PharmD and provider.  .   Interventions: . 1:1 collaboration with [name], MD regarding development and update of comprehensive plan of care as evidenced by provider attestation and co-signature . Inter-disciplinary care team collaboration (see longitudinal plan of care) . Comprehensive medication review performed; medication list updated in electronic medical record  Diabetes: . Uncontrolled; current treatment: Lantus 40 units BID, Novolog per sliding scale, 12-14 units usually  . Current glucose readings: utilizing dexcom CGM. Communicating with endocrinology. Wife reports "random spikes" of glucose that do not correspond with meals.  . Peripheral neuropathy: gabapentin 300 mg BID . Denies hypoglycemic symptoms since dose reduction of Lantus per endocrinology . Encouraged continued collaboration and communication with endocrinology regarding glucose readings and concerns.   Hypertension with CKD: . Uncontrolled; current treatment: lisinopril 5 mg daily, hydralazine 12.5 mg BID, metoprolol tartrate 50 mg BID, amlodipine 2.5 mg daily - [name] reports that nephrology had discontinued amlodipine (likely d/t LEE), but Terra restarted d/t high BP readings at home. Torsemide 20 mg daily added by nephrology, [name] reports this has reduced LEE and patient is tolerating . Current home readings: ~140/80s . Encouraged Terra to communicate home readings, as well as self-restart of amlodipine, to  nephrology for further instruction.   Hyperlipidemia: . Uncontrolled but anticipated to be improved; current treatment: atorvastatin 80 mg daily, ezetimibe 10 mg daily recently added  . Recommend to continue current regimen at this time. Repeat lipid panel expected with next PCP visit  Allergies, s/p COVID: . Moderately well controlled per Terra's report; current using fluticasone nasal spray PRN . Recommend to continue current regimen at this time.   GI (GERD hx Barrett's, gastroparesis, family hx NASH): . Appropriately managed per current regimen: omeprazole 20 mg daily for GERD; dietary discussions w/ Dr. Hilarie Fredrickson regarding gastroparesis, discussion of metoclopramide but was not prescribed. Senna PRN constipation, wife reports patient has not needed often recently . Recommend to continue current regimen with dietary modifications at this time.    Patient Goals/Self-Care Activities . Over the next 90 days, patient will:  - take medications as prescribed check glucose TID using CGM, document, and provide at future appointments check blood pressure daily, document, and provide at future appointments Communicate with specialty providers  Follow Up Plan: Telephone follow up appointment with care management team member scheduled for: ~ 4 weeks - offered to cease care management services, patient's wife requests that we continue collaboration      The patient verbalized understanding of instructions, educational materials, and care plan provided today and declined offer to receive copy of patient instructions, educational materials, and care plan.   Plan: Telephone follow up appointment with care management team member scheduled for: ~ 5 weeks per wife's request to continue care management services  Catie Darnelle Maffucci, PharmD, [place], [place] Clinical Pharmacist [employer] at [facility] [phone]

## 2020-04-14 HISTORY — PX: OTHER SURGICAL HISTORY: SHX169

## 2020-04-17 ENCOUNTER — Encounter: Payer: Self-pay | Admitting: *Deleted

## 2020-04-17 DIAGNOSIS — Z951 Presence of aortocoronary bypass graft: Secondary | ICD-10-CM

## 2020-04-18 ENCOUNTER — Encounter: Payer: Managed Care, Other (non HMO) | Attending: Cardiology

## 2020-04-18 DIAGNOSIS — Z951 Presence of aortocoronary bypass graft: Secondary | ICD-10-CM | POA: Insufficient documentation

## 2020-04-24 ENCOUNTER — Telehealth: Payer: Self-pay | Admitting: *Deleted

## 2020-04-24 NOTE — Telephone Encounter (Signed)
Called to check on patient. Left message on Chase Clark's cell.  Was supposed to return last week.

## 2020-04-26 ENCOUNTER — Ambulatory Visit: Payer: Managed Care, Other (non HMO) | Admitting: Plastic Surgery

## 2020-04-27 ENCOUNTER — Ambulatory Visit (HOSPITAL_COMMUNITY): Payer: Managed Care, Other (non HMO)

## 2020-05-02 ENCOUNTER — Telehealth: Payer: Self-pay

## 2020-05-02 NOTE — Telephone Encounter (Signed)
VM full and cannot accept messages

## 2020-05-07 ENCOUNTER — Telehealth: Payer: Self-pay | Admitting: Internal Medicine

## 2020-05-07 NOTE — Telephone Encounter (Signed)
Pts wife states that she already got the [place] rescheduled. States she needed to reschedule his ECL also. Appt moved to 05/21/20 at 8am. She is aware of appts. New instructions sent to pt via mychart.

## 2020-05-09 ENCOUNTER — Encounter: Payer: Self-pay | Admitting: *Deleted

## 2020-05-09 DIAGNOSIS — Z951 Presence of aortocoronary bypass graft: Secondary | ICD-10-CM

## 2020-05-09 NOTE — Progress Notes (Signed)
Cardiac Individual Treatment Plan  Patient Details  Name: Chase Clark MRN: [id number] Date of Birth: 1965/10/15 Referring Provider:   Flowsheet Row Cardiac Rehab from 02/23/2020 in [facility] Cardiac and Pulmonary Rehab  Referring Provider Bartholome Bill MD      Initial Encounter Date:  Flowsheet Row Cardiac Rehab from 02/23/2020 in [facility] Cardiac and Pulmonary Rehab  Date 02/23/20      Visit Diagnosis: S/P CABG x 2  Patient's Home Medications on Admission:  Current Outpatient Medications:  .  acetaminophen (TYLENOL) 325 MG tablet, Take 1 tablet (325 mg total) by mouth every 6 (six) hours as needed for mild pain (pain score 1-3 or temp > 100.5)., Disp: 30 tablet, Rfl: 0 .  amLODipine (NORVASC) 5 MG tablet, Take 2.5 mg by mouth daily., Disp: , Rfl:  .  aspirin (ASPIRIN CHILDRENS) 81 MG chewable tablet, Chew 1 tablet (81 mg total) by mouth daily., Disp: 30 tablet, Rfl: 1 .  atorvastatin (LIPITOR) 80 MG tablet, TAKE 1 TABLET BY MOUTH EVERY DAY, Disp: 90 tablet, Rfl: 1 .  brimonidine-timolol (COMBIGAN) 0.2-0.5 % ophthalmic solution, Place 1 drop into the right eye every 12 (twelve) hours. , Disp: , Rfl:  .  cholecalciferol (VITAMIN D3) 25 MCG (1000 UT) tablet, Take 1,000 Units by mouth daily., Disp: , Rfl:  .  Continuous Blood Gluc Sensor (DEXCOM G6 SENSOR) MISC, Inject 1 Device into the skin as directed. Place a new sensor every 10 days to check blood sugar at least 4 times daily, Disp: 9 each, Rfl: 3 .  Continuous Blood Gluc Transmit (DEXCOM G6 TRANSMITTER) MISC, Inject 1 Device into the skin as directed. Use to check blood sugar at least 4 times daily, Disp: 1 each, Rfl: 3 .  dorzolamide (TRUSOPT) 2 % ophthalmic solution, 1 drop 2 (two) times daily., Disp: , Rfl:  .  ezetimibe (ZETIA) 10 MG tablet, Take 1 tablet (10 mg total) by mouth daily., Disp: 90 tablet, Rfl: 3 .  fluticasone (FLONASE) 50 MCG/ACT nasal spray, SPRAY 2 SPRAYS INTO EACH NOSTRIL EVERY DAY, Disp: 48 mL, Rfl: 2 .  gabapentin  (NEURONTIN) 300 MG capsule, Take 1 capsule (300 mg total) by mouth 2 (two) times daily., Disp: 60 capsule, Rfl: 6 .  hydrALAZINE (APRESOLINE) 25 MG tablet, Take 12.5 mg by mouth in the morning and at bedtime., Disp: , Rfl:  .  Insulin Glargine (BASAGLAR KWIKPEN) 100 UNIT/ML, Inject 40 Units into the skin 2 (two) times daily. Further refills to come from endocrinology, Disp: 75 mL, Rfl: 3 .  insulin lispro (HUMALOG KWIKPEN) 100 UNIT/ML KwikPen, Inject 6 Units into the skin 3 (three) times daily. Inject up to 15 units TID with meals. Further refills to come from endocrinology, Disp: 30 mL, Rfl: 2 .  Insulin Pen Needle 32G X 4 MM MISC, Inject 5 pens into the skin daily. Use to inject Lantus and Novolog up to 5 times daily, Disp: 500 each, Rfl: 1 .  Lactulose 20 GM/30ML SOLN, Take 30 mLs (20 g total) by mouth as needed. (Patient not taking: Reported on 04/12/2020), Disp: 450 mL, Rfl: 1 .  latanoprost (XALATAN) 0.005 % ophthalmic solution, Place 1 drop into both eyes at bedtime., Disp: , Rfl: 5 .  lisinopril (ZESTRIL) 5 MG tablet, Take 1 tablet (5 mg total) by mouth daily., Disp: 90 tablet, Rfl: 1 .  metoprolol tartrate (LOPRESSOR) 100 MG tablet, TAKE 0.5 TABLETS (50 MG TOTAL) BY MOUTH 2 (TWO) TIMES DAILY., Disp: 90 tablet, Rfl: 1 .  omeprazole (  PRILOSEC) 20 MG capsule, Take 20 mg by mouth daily., Disp: , Rfl:  .  senna (SENOKOT) 8.6 MG tablet, Take 1 tablet by mouth daily. , Disp: , Rfl:  .  Sodium Sulfate-Mag Sulfate-KCl (SUTAB) [phone] MG TABS, Use as directed for colonoscopy. MANUFACTURER CODES!! BIN: [id number] PCN: CN GROUP: [id number] MEMBER ID: [id number];[employer] AS SECONDARY INSURANCE ;NO PRIOR AUTHORIZATION, Disp: 24 tablet, Rfl: 0 .  torsemide (DEMADEX) 20 MG tablet, Take 20 mg by mouth., Disp: , Rfl:   Past Medical History: Past Medical History:  Diagnosis Date  . Arthritis   . Asthma    as child  . Barrett's esophagus 02/16/2014   short segment  . Chronic kidney disease (CKD), stage IV  (severe) ([place])   . Coronary artery disease    DES DIAG1 11/20/09 [facility])  . DDD (degenerative disc disease), cervical   . Diabetes mellitus without complication ([place])   . Diabetic osteomyelitis ([place])   . Diabetic peripheral neuropathy ([place])   . Diabetic ulcer of foot with muscle involvement without evidence of necrosis ([place])    DIABETIC ULCERATIONS ASSOCIATED WITH IRRITATION LATERAL ANKLE LEFT GREATER THAN RIGHT WITH MILD CELLULITIS   . Diverticulosis   . DKA (diabetic ketoacidoses) 08/23/2017  . Edema, lower extremity   . Fatty liver   . Gastroparesis 2015  . GERD (gastroesophageal reflux disease)   . Gilbert's disease   . Glaucoma   . Heart disease 2011   Patient has stent for 80% blockage  . Hyperlipidemia   . Hypertension   . Left below-knee amputee ([place])   . Neuropathy   . Osteoarthritis of spine   . Osteomyelitis of left foot ([place])   . PVD (peripheral vascular disease) ([place])   . Seasonal allergies   . Shoulder pain   . Ulcer of foot ([place]) 08.06.2014   DIABETIC ULCERATIONS ASSOCIATED WITH IRRITATION LATERAL ANKLE LEFT GREATER THAN RIGHT WITH MILD CELLULITIS    Tobacco Use: Social History   Tobacco Use  Smoking Status Former Smoker  . Types: Cigarettes  . Quit date: 03/14/2008  . Years since quitting: 12.1  Smokeless Tobacco Never Used    Labs: Recent Chemical engineer    Labs for ITP Cardiac and Pulmonary Rehab Latest Ref Rng & Units 01/27/2019 01/27/2019 05/24/2019 02/22/2020 03/15/2020   Cholestrol 0 - 200 mg/dL - - 126 - 161   LDLCALC 0 - 99 mg/dL - - 55 - -   LDLDIRECT mg/dL - - - - 75.0   HDL >39.00 mg/dL - - 31.70(L) - 34.10(L)   Trlycerides 0.0 - 149.0 mg/dL - - 199.0(H) - 372.0(H)   Hemoglobin A1c 4.0 - 5.6 % - - 5.9 9.0(A) -   PHART 7.350 - 7.450 7.356 7.330(L) - - -   PCO2ART 32.0 - 48.0 mmHg 42.9 44.0 - - -   HCO3 20.0 - 28.0 mmol/L 24.2 23.3 - - -   TCO2 22 - 32 mmol/L 26 25 - - -   ACIDBASEDEF 0.0 - 2.0 mmol/L 1.0 3.0(H) - - -   O2SAT % 98.0 93.0  - - -       Exercise Target Goals: Exercise Program Goal: Individual exercise prescription set using results from initial 6 min walk test and THRR while considering  patient's activity barriers and safety.   Exercise Prescription Goal: Initial exercise prescription builds to 30-45 minutes a day of aerobic activity, 2-3 days per week.  Home exercise guidelines will be given to patient during program as part of exercise prescription  that the participant will acknowledge.   Education: Aerobic Exercise: - Group verbal and visual presentation on the components of exercise prescription. Introduces F.I.T.T principle from ACSM for exercise prescriptions.  Reviews F.I.T.T. principles of aerobic exercise including progression. Written material given at graduation.   Education: Resistance Exercise: - Group verbal and visual presentation on the components of exercise prescription. Introduces F.I.T.T principle from ACSM for exercise prescriptions  Reviews F.I.T.T. principles of resistance exercise including progression. Written material given at graduation.    Education: Exercise & Equipment Safety: - Individual verbal instruction and demonstration of equipment use and safety with use of the equipment. Flowsheet Row Cardiac Rehab from 03/14/2020 in [facility] Cardiac and Pulmonary Rehab  Date 02/23/20  Educator [facility]  Instruction Review Code 1- Verbalizes Understanding      Education: Exercise Physiology & General Exercise Guidelines: - Group verbal and written instruction with models to review the exercise physiology of the cardiovascular system and associated critical values. Provides general exercise guidelines with specific guidelines to those with heart or lung disease.  Flowsheet Row Cardiac Rehab from 03/14/2020 in [facility] Cardiac and Pulmonary Rehab  Education need identified 02/23/20      Education: Flexibility, Balance, Mind/Body Relaxation: - Group verbal and visual presentation with  interactive activity on the components of exercise prescription. Introduces F.I.T.T principle from ACSM for exercise prescriptions. Reviews F.I.T.T. principles of flexibility and balance exercise training including progression. Also discusses the mind body connection.  Reviews various relaxation techniques to help reduce and manage stress (i.e. Deep breathing, progressive muscle relaxation, and visualization). Balance handout provided to take home. Written material given at graduation.   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 HR 86 bpm     Max Ex. BP 174/76     2 Minute Post BP 128/64            Oxygen Initial Assessment:   Oxygen Re-Evaluation:   Oxygen Discharge (Final Oxygen Re-Evaluation):   Initial Exercise Prescription:  Initial Exercise Prescription - 02/23/20 1000      Date of Initial Exercise RX and Referring Provider   Date 02/23/20    Referring Provider Bartholome Bill MD      Treadmill   MPH 2    Grade 0.5    Minutes  15    METs 2.67      NuStep   Level 2    SPM 80    Minutes 15    METs 2       T5 Nustep   Level 2    SPM 80    Minutes 15    METs 2      Biostep-RELP   Level 2    SPM 50    Minutes 15    METs 2      Prescription Details   Frequency (times per week) 3    Duration Progress to 30 minutes of continuous aerobic without signs/symptoms of physical distress      Intensity   THRR 40-80% of Max Heartrate 108-145    Ratings of Perceived Exertion 11-13    Perceived Dyspnea 0-4      Progression   Progression Continue to progress workloads to maintain intensity without signs/symptoms of physical distress.      Resistance Training   Training Prescription Yes    Weight 3 lb    Reps 10-15           Perform Capillary Blood Glucose checks as needed.  Exercise Prescription Changes:  Exercise Prescription Changes    Row Name 02/23/20 1000 03/05/20 1700 03/21/20 0900         Response to Exercise   Blood Pressure (Admit) 126/64 128/70 148/86     Blood Pressure (Exercise) 174/76 138/66 152/74     Blood Pressure (Exit) 128/74 140/80 142/72     Heart Rate (Admit) 70 bpm 75 bpm 71 bpm     Heart Rate (Exercise) 86 bpm 89 bpm 91 bpm     Heart Rate (Exit) 69 bpm 78 bpm 71 bpm     Oxygen Saturation (Admit) 99 % -- --     Oxygen Saturation (Exercise) 98 % -- --     Rating of Perceived Exertion (Exercise) 13 13 13      Perceived Dyspnea (Exercise) 2 -- --     Symptoms hips sore (2/10), fatigue, SOB none none     Comments walk test results -- --     Duration -- -- Continue with 30 min of aerobic exercise without signs/symptoms of physical distress.     Intensity -- -- THRR unchanged           Progression   Progression -- Continue to progress workloads to maintain intensity without signs/symptoms of physical distress. Continue to progress workloads to maintain intensity without signs/symptoms of physical distress.     Average METs -- 3.6 2.54           Resistance Training   Training Prescription -- Yes Yes     Weight -- 5 lb 5 lb     Reps -- 10-15 10-15            Interval Training   Interval Training -- -- No           Treadmill   MPH -- -- 2     Grade -- -- 0.5     Minutes -- -- 15     METs -- -- 2.53           NuStep   Level -- 2 --     SPM -- 80 --     Minutes -- 15 --     METs -- 4.2 --           T5 Nustep   Level -- -- 2  Minutes -- -- 15     METs -- -- 2.4           Biostep-RELP   Level -- 3 --     SPM -- 50 --     Minutes -- 15 --     METs -- 3 --            Exercise Comments:  Exercise Comments    Row Name 02/24/20 0818 03/05/20 [id number]         Exercise Comments First full day of exercise!  Patient was oriented to gym and equipment including functions, settings, policies, and procedures.  Patient's individual exercise prescription and treatment plan were reviewed.  All starting workloads were established based on the results of the 6 minute walk test done at initial orientation visit.  The plan for exercise progression was also introduced and progression will be customized based on patient's performance and goals. Weight up 7 pounds from last visit. Avrohom stated he did feel some fluid. He will go home and discuss with his wife about takng an extra lasix. He has new kidney doctor appointment this Wednesday             Exercise Goals and Review:  Exercise Goals    Row Name 02/23/20 1018             Exercise Goals   Increase Physical Activity Yes       Intervention Provide advice, education, support and counseling about physical activity/exercise needs.;Develop an individualized exercise prescription for aerobic and resistive training based on initial evaluation findings, risk stratification, comorbidities and participant's personal goals.       Expected Outcomes Short Term: Attend rehab on a regular basis to increase amount of physical activity.;Long Term: Add in home exercise to make exercise part of routine and to increase amount of physical activity.;Long Term: Exercising regularly at least 3-5 days a  week.       Increase Strength and Stamina Yes       Intervention Provide advice, education, support and counseling about physical activity/exercise needs.;Develop an individualized exercise prescription for aerobic and resistive training based on initial evaluation findings, risk stratification, comorbidities and participant's personal goals.       Expected Outcomes Short Term: Increase workloads from initial exercise prescription for resistance, speed, and METs.;Short Term: Perform resistance training exercises routinely during rehab and add in resistance training at home;Long Term: Improve cardiorespiratory fitness, muscular endurance and strength as measured by increased METs and functional capacity (6MWT)       Able to understand and use rate of perceived exertion (RPE) scale Yes       Intervention Provide education and explanation on how to use RPE scale       Expected Outcomes Short Term: Able to use RPE daily in rehab to express subjective intensity level;Long Term:  Able to use RPE to guide intensity level when exercising independently       Able to understand and use Dyspnea scale Yes       Intervention Provide education and explanation on how to use Dyspnea scale       Expected Outcomes Short Term: Able to use Dyspnea scale daily in rehab to express subjective sense of shortness of breath during exertion;Long Term: Able to use Dyspnea scale to guide intensity level when exercising independently       Knowledge and understanding of Target Heart Rate Range (THRR) Yes       Intervention Provide education and explanation of  THRR including how the numbers were predicted and where they are located for reference       Expected Outcomes Short Term: Able to state/look up THRR;Short Term: Able to use daily as guideline for intensity in rehab;Long Term: Able to use THRR to govern intensity when exercising independently       Able to check pulse independently Yes       Intervention Provide education and  demonstration on how to check pulse in carotid and radial arteries.;Review the importance of being able to check your own pulse for safety during independent exercise       Expected Outcomes Short Term: Able to explain why pulse checking is important during independent exercise;Long Term: Able to check pulse independently and accurately       Understanding of Exercise Prescription Yes       Intervention Provide education, explanation, and written materials on patient's individual exercise prescription       Expected Outcomes Short Term: Able to explain program exercise prescription;Long Term: Able to explain home exercise prescription to exercise independently              Exercise Goals Re-Evaluation :  Exercise Goals Re-Evaluation    Row Name 02/24/20 0818 03/05/20 1711 03/21/20 0901 04/17/20 1600       Exercise Goal Re-Evaluation   Exercise Goals Review Understanding of Exercise Prescription;Knowledge and understanding of Target Heart Rate Range (THRR);Able to understand and use Dyspnea scale;Able to understand and use rate of perceived exertion (RPE) scale Increase Physical Activity;Increase Strength and Stamina Increase Physical Activity;Increase Strength and Stamina;Understanding of Exercise Prescription --    Comments Reviewed RPE and dyspnea scales, THR and program prescription with pt today.  Pt voiced understanding and was given a copy of goals to take home. Shail has tolerated exercise well in his first week.  Staff will monitor progress. Zyheir has tested positive for COVID and will be out.  He was up to 2.4 METs on the T5 NuStep.  We will continue to monitor his progress upon return. Out sine last review    Expected Outcomes Short: Use RPE daily to regulate intensity. Long: Follow program prescription in THR. Short: attend consistently Long: increase overall stamina Short; Return once cleared to regular attendance  Long: Continue to improve stamina. --           Discharge Exercise  Prescription (Final Exercise Prescription Changes):  Exercise Prescription Changes - 03/21/20 0900      Response to Exercise   Blood Pressure (Admit) 148/86    Blood Pressure (Exercise) 152/74    Blood Pressure (Exit) 142/72    Heart Rate (Admit) 71 bpm    Heart Rate (Exercise) 91 bpm    Heart Rate (Exit) 71 bpm    Rating of Perceived Exertion (Exercise) 13    Symptoms none    Duration Continue with 30 min of aerobic exercise without signs/symptoms of physical distress.    Intensity THRR unchanged      Progression   Progression Continue to progress workloads to maintain intensity without signs/symptoms of physical distress.    Average METs 2.54      Resistance Training   Training Prescription Yes    Weight 5 lb    Reps 10-15      Interval Training   Interval Training No      Treadmill   MPH 2    Grade 0.5    Minutes 15    METs 2.53      T5  Nustep   Level 2    Minutes 15    METs 2.4           Nutrition:  Target Goals: Understanding of nutrition guidelines, daily intake of sodium 1500mg , cholesterol 200mg , calories 30% from fat and 7% or less from saturated fats, daily to have 5 or more servings of fruits and vegetables.  Education: All About Nutrition: -Group instruction provided by verbal, written material, interactive activities, discussions, models, and posters to present general guidelines for heart healthy nutrition including fat, fiber, MyPlate, the role of sodium in heart healthy nutrition, utilization of the nutrition label, and utilization of this knowledge for meal planning. Follow up email sent as well. Written material given at graduation. Flowsheet Row Cardiac Rehab from 03/14/2020 in [facility] Cardiac and Pulmonary Rehab  Education need identified 02/23/20      Biometrics:  Pre Biometrics - 02/23/20 1019      Pre Biometrics   Height 5\' 11"  (1.803 m)    Weight 231 lb 1.6 oz (104.8 kg)    BMI (Calculated) 32.25    Single Leg Stand 0.5 seconds   only  did good leg           Nutrition Therapy Plan and Nutrition Goals:  Nutrition Therapy & Goals - 02/23/20 1025      Intervention Plan   Intervention Prescribe, educate and counsel regarding individualized specific dietary modifications aiming towards targeted core components such as weight, hypertension, lipid management, diabetes, heart failure and other comorbidities.    Expected Outcomes Short Term Goal: Understand basic principles of dietary content, such as calories, fat, sodium, cholesterol and nutrients.;Short Term Goal: A plan has been developed with personal nutrition goals set during dietitian appointment.;Long Term Goal: Adherence to prescribed nutrition plan.           Nutrition Assessments:  Nutrition Assessments - 02/23/20 1023      MEDFICTS Scores   Pre Score 39          MEDIFICTS Score Key:  ?70 Need to make dietary changes   40-70 Heart Healthy Diet  ? 40 Therapeutic Level Cholesterol Diet   Picture Your Plate Scores:  <81 Unhealthy dietary pattern with much room for improvement.  41-50 Dietary pattern unlikely to meet recommendations for good health and room for improvement.  51-60 More healthful dietary pattern, with some room for improvement.   >60 Healthy dietary pattern, although there may be some specific behaviors that could be improved.    Nutrition Goals Re-Evaluation:   Nutrition Goals Discharge (Final Nutrition Goals Re-Evaluation):   Psychosocial: Target Goals: Acknowledge presence or absence of significant depression and/or stress, maximize coping skills, provide positive support system. Participant is able to verbalize types and ability to use techniques and skills needed for reducing stress and depression.   Education: Stress, Anxiety, and Depression - Group verbal and visual presentation to define topics covered.  Reviews how body is impacted by stress, anxiety, and depression.  Also discusses healthy ways to reduce stress and  to treat/manage anxiety and depression.  Written material given at graduation.   Education: Sleep Hygiene -Provides group verbal and written instruction about how sleep can affect your health.  Define sleep hygiene, discuss sleep cycles and impact of sleep habits. Review good sleep hygiene tips.    Initial Review & Psychosocial Screening:  Initial Psych Review & Screening - 02/10/20 1018      Initial Review   Current issues with Current Stress Concerns    Source of Stress  Concerns Chronic Illness;Occupation;Unable to participate in former interests or hobbies;Unable to perform yard/household activities      [place]? Yes   wife     Barriers   Psychosocial barriers to participate in program There are no identifiable barriers or psychosocial needs.;The patient should benefit from training in stress management and relaxation.      Screening Interventions   Interventions Encouraged to exercise;Provide feedback about the scores to participant;To provide support and resources with identified psychosocial needs    Expected Outcomes Short Term goal: Utilizing psychosocial counselor, staff and physician to assist with identification of specific Stressors or current issues interfering with healing process. 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  A difference of  2-3 points is a clinically meaningful difference.  A difference of 2-3 points in the total score of the Quality of Life Index has been associated with significant improvement in overall quality of life, self-image, physical symptoms, and general health in studies assessing change in quality of life.  PHQ-9: Recent Review Flowsheet Data    Depression screen Brand [facility] 2/9 02/23/2020 02/22/2019 11/19/2018 12/28/2017 09/25/2017   Decreased Interest 0 0 0 0 0   Down, Depressed, Hopeless 0 0 0 0 0   PHQ - 2 Score 0 0 0 0 0   Altered sleeping 0 - - - -   Tired, decreased energy 2 - - - -   Change in appetite 0 - - - -   Feeling bad or failure about yourself  0 - - - -   Trouble concentrating 0 - - - -   Moving slowly or fidgety/restless 0 - - - -   Suicidal thoughts 0 - - - -   PHQ-9 Score 2 - - - -   Difficult doing work/chores Not difficult at all - - - -     Interpretation of Total Score  Total Score Depression Severity:  1-4 = Minimal depression, 5-9 = Mild depression, 10-14 = Moderate depression, 15-19 = Moderately severe depression, 20-27 = Severe depression   Psychosocial Evaluation and Intervention:  Psychosocial Evaluation - 02/10/20 1019      Psychosocial Evaluation & Interventions   Interventions Stress management education;Relaxation education;Encouraged to exercise with the program and follow exercise prescription    Comments 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 He was unable to attend cardiac rehab due to COVID, but is looking forward to starting it now. 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 He admits to being more sedentary this last year due to [place] so he is really wanting to boost his stamina  through exercise. He is currently on long term disability from work and doesn't know if he can go back due to his worsening vision. Insurance has  also been giving him issues with his diabetes medication but he hopes to resolve it soon.    Expected Outcomes Short: attend cardiac rehab for education and exercise. Long: develop positive self care habits.    Continue Psychosocial Services  Follow up required by staff           Psychosocial Re-Evaluation:   Psychosocial Discharge (Final Psychosocial Re-Evaluation):   Vocational Rehabilitation: Provide vocational rehab assistance to qualifying candidates.   Vocational Rehab Evaluation & Intervention:  Vocational Rehab - 02/10/20 1017      Initial Vocational Rehab Evaluation & Intervention   Assessment shows need for Vocational Rehabilitation No           Education: Education Goals: Education classes will be provided on a variety of topics geared toward better understanding of heart health and risk factor modification. Participant will state understanding/return demonstration of topics presented as noted by education test scores.  Learning Barriers/Preferences:  Learning Barriers/Preferences - 02/10/20 1016      Learning Barriers/Preferences   Learning Barriers Sight    Learning Preferences Audio;Group Instruction;Individual Instruction;Pictoral;Skilled Demonstration;Verbal Instruction           General Cardiac Education Topics:  AED/CPR: - Group verbal and written instruction with the use of models to demonstrate the basic use of the AED with the basic ABC's of resuscitation.   Anatomy and Cardiac Procedures: - Group verbal and visual presentation and models provide information about basic cardiac anatomy and function. Reviews the testing methods done to diagnose heart disease and the outcomes of the test results. Describes the treatment choices: Medical Management, Angioplasty, or Coronary Bypass Surgery for treating various heart conditions including Myocardial Infarction, Angina, Valve Disease, and Cardiac Arrhythmias.  Written material given at  graduation.   Medication Safety: - Group verbal and visual instruction to review commonly prescribed medications for heart and lung disease. Reviews the medication, class of the drug, and side effects. Includes the steps to properly store meds and maintain the prescription regimen.  Written material given at graduation. Flowsheet Row Cardiac Rehab from 03/14/2020 in [facility] Cardiac and Pulmonary Rehab  Date 02/29/20  Educator SB  Instruction Review Code 1- Verbalizes Understanding      Intimacy: - Group verbal instruction through game format to discuss how heart and lung disease can affect sexual intimacy. Written material given at graduation..   Know Your Numbers and Heart Failure: - Group verbal and visual instruction to discuss disease risk factors for cardiac and pulmonary disease and treatment options.  Reviews associated critical values for Overweight/Obesity, Hypertension, Cholesterol, and Diabetes.  Discusses basics of heart failure: signs/symptoms and treatments.  Introduces Heart Failure Zone chart for action plan for heart failure.  Written material given at graduation.   Infection Prevention: - Provides verbal and written material to individual with discussion of infection control including proper hand washing and proper equipment cleaning during exercise session. Flowsheet Row Cardiac Rehab from 03/14/2020 in [facility] Cardiac and Pulmonary Rehab  Date 02/23/20  Educator [facility]  Instruction Review Code 1- Verbalizes Understanding      Falls Prevention: - Provides verbal and written material to individual with discussion of falls prevention and safety. Flowsheet Row Cardiac Rehab from 03/14/2020 in [facility] Cardiac and Pulmonary Rehab  Date 02/23/20  Educator [facility]  Instruction Review Code 1- Verbalizes Understanding      Other: -  Provides group and verbal instruction on various topics (see comments)   Knowledge Questionnaire Score:  Knowledge Questionnaire Score - 02/23/20 1024       Knowledge Questionnaire Score   Pre Score 23/26 Education Focus: Nutrition and Exercise           Core Components/Risk Factors/Patient Goals at Admission:  Personal Goals and Risk Factors at Admission - 02/23/20 1025      Core Components/Risk Factors/Patient Goals on Admission    Weight Management Yes;Weight Loss    Intervention Weight Management: Develop a combined nutrition and exercise program designed to reach desired caloric intake, while maintaining appropriate intake of nutrient and fiber, sodium and fats, and appropriate energy expenditure required for the weight goal.;Weight Management: Provide education and appropriate resources to help participant work on and attain dietary goals.;Weight Management/Obesity: Establish reasonable short term and long term weight goals.;Obesity: Provide education and appropriate resources to help participant work on and attain dietary goals.    Admit Weight 231 lb 1.6 oz (104.8 kg)    Goal Weight: Short Term 225 lb (102.1 kg)    Goal Weight: Long Term 220 lb (99.8 kg)    Expected Outcomes Short Term: Continue to assess and modify interventions until short term weight is achieved;Long Term: Adherence to nutrition and physical activity/exercise program aimed toward attainment of established weight goal;Understanding recommendations for meals to include 15-35% energy as protein, 25-35% energy from fat, 35-60% energy from carbohydrates, less than 200mg  of dietary cholesterol, 20-35 gm of total fiber daily;Weight Loss: Understanding of general recommendations for a balanced deficit meal plan, which promotes 1-2 lb weight loss per week and includes a negative energy balance of [id number] kcal/d;Understanding of distribution of calorie intake throughout the day with the consumption of 4-5 meals/snacks    Diabetes Yes    Intervention Provide education about signs/symptoms and action to take for hypo/hyperglycemia.;Provide education about proper nutrition, including  hydration, and aerobic/resistive exercise prescription along with prescribed medications to achieve blood glucose in normal ranges: Fasting glucose 65-99 mg/dL    Expected Outcomes Short Term: Participant verbalizes understanding of the signs/symptoms and immediate care of hyper/hypoglycemia, proper foot care and importance of medication, aerobic/resistive exercise and nutrition plan for blood glucose control.;Long Term: Attainment of HbA1C < 7%.    Hypertension Yes    Intervention Provide education on lifestyle modifcations including regular physical activity/exercise, weight management, moderate sodium restriction and increased consumption of fresh fruit, vegetables, and low fat dairy, alcohol moderation, and smoking cessation.;Monitor prescription use compliance.    Expected Outcomes Short Term: Continued assessment and intervention until BP is < 140/106mm HG in hypertensive participants. < 130/64mm HG in hypertensive participants with diabetes, heart failure or chronic kidney disease.;Long Term: Maintenance of blood pressure at goal levels.    Lipids Yes    Intervention Provide education and support for participant on nutrition & aerobic/resistive exercise along with prescribed medications to achieve LDL 70mg , HDL >40mg .    Expected Outcomes Short Term: Participant states understanding of desired cholesterol values and is compliant with medications prescribed. Participant is following exercise prescription and nutrition guidelines.;Long Term: Cholesterol controlled with medications as prescribed, with individualized exercise RX and with personalized nutrition plan. Value goals: LDL < 70mg , HDL > 40 mg.           Education:Diabetes - Individual verbal and written instruction to review signs/symptoms of diabetes, desired ranges of glucose level fasting, after meals and with exercise. Acknowledge that pre and post exercise glucose checks will be done for 3  sessions at entry of program. Flowsheet Row  Cardiac Rehab from 03/14/2020 in [facility] Cardiac and Pulmonary Rehab  Date 02/10/20  Educator [facility]  Instruction Review Code 1- Verbalizes Understanding      Core Components/Risk Factors/Patient Goals Review:    Core Components/Risk Factors/Patient Goals at Discharge (Final Review):    ITP Comments:  ITP Comments    Row Name 02/10/20 [phone] 02/23/20 1005 02/24/20 0817 03/05/20 0821 03/14/20 0948   ITP Comments Initial telephone orientation completed. Diagnosis can be found in [facility] 10/7. EP orientation scheduled for 11/2 at 8am Completed 6MWT and gym orientation. Initial ITP created and sent for review to Dr. Emily Filbert, Medical Director. First full day of exercise!  Patient was oriented to gym and equipment including functions, settings, policies, and procedures.  Patient's individual exercise prescription and treatment plan were reviewed.  All starting workloads were established based on the results of the 6 minute walk test done at initial orientation visit.  The plan for exercise progression was also introduced and progression will be customized based on patient's performance and goals. Weight up 7 pounds from last visit. Zane stated he did feel some fluid. He will go home and discuss with his wife about takng an extra lasix. He has new kidney doctor appointment this Wednesday 30 Day review completed. Medical Director ITP review done, changes made as directed, and signed approval by Medical Director.   Row Name 03/19/20 1213 04/04/20 1250 04/11/20 0558 04/11/20 1326 04/17/20 1559   ITP Comments Pt's wife left message.  Cartel has tested positive for COVID.  Message said he would be back next week.  Left message for them that we need test dates to verify return dates. Arvle has been absent since last review. 30 Day review completed. Medical Director ITP review done, changes made as directed, and signed approval by Medical Director. Patient's wife called, patient has been out of rehab due to testing  positive for COVID on 03/21/20. Verified patient had no symptoms and is welcome back on 04/16/20. Patient's wife aware. Set to return on 04/18/20 after being out with COVID.   [place] Name 04/24/20 1411 05/08/20 1148 05/09/20 0919       ITP Comments Called to check on patient. Left message on Terra's cell.  Was supposed to return last week. Moksh has not been to rehab since his set return date on 04/18/20 after having COVID - unable to get goals this round 30 Day review completed. Medical Director ITP review done, changes made as directed, and signed approval by Medical Director.            Comments:

## 2020-05-14 ENCOUNTER — Encounter: Payer: Managed Care, Other (non HMO) | Admitting: Internal Medicine

## 2020-05-14 ENCOUNTER — Encounter: Payer: Self-pay | Admitting: *Deleted

## 2020-05-14 ENCOUNTER — Telehealth: Payer: Self-pay | Admitting: Internal Medicine

## 2020-05-14 DIAGNOSIS — Z951 Presence of aortocoronary bypass graft: Secondary | ICD-10-CM

## 2020-05-14 NOTE — Telephone Encounter (Signed)
Patients wife change patients DEXCOM sensor - when they did this they lost the ability for her to also see his glucose readings - they are getting a message that they need to have "someone send them a code" in order to see the readings  Call back # [phone]

## 2020-05-14 NOTE — Telephone Encounter (Signed)
Message left for patient's wife to return my call.  I do not know what to do. I can offer Systems analyst Support number [phone]

## 2020-05-15 NOTE — Telephone Encounter (Signed)
Jarrett Soho gave the information below to patient's wife.

## 2020-05-17 ENCOUNTER — Encounter: Payer: Self-pay | Admitting: Internal Medicine

## 2020-05-17 ENCOUNTER — Encounter: Payer: Self-pay | Admitting: *Deleted

## 2020-05-17 ENCOUNTER — Ambulatory Visit: Payer: Managed Care, Other (non HMO) | Admitting: Pharmacist

## 2020-05-17 DIAGNOSIS — Z951 Presence of aortocoronary bypass graft: Secondary | ICD-10-CM

## 2020-05-17 DIAGNOSIS — I2510 Atherosclerotic heart disease of native coronary artery without angina pectoris: Secondary | ICD-10-CM

## 2020-05-17 DIAGNOSIS — E785 Hyperlipidemia, unspecified: Secondary | ICD-10-CM

## 2020-05-17 DIAGNOSIS — E103593 Type 1 diabetes mellitus with proliferative diabetic retinopathy without macular edema, bilateral: Secondary | ICD-10-CM

## 2020-05-17 DIAGNOSIS — N184 Chronic kidney disease, stage 4 (severe): Secondary | ICD-10-CM

## 2020-05-17 DIAGNOSIS — I739 Peripheral vascular disease, unspecified: Secondary | ICD-10-CM

## 2020-05-17 DIAGNOSIS — I10 Essential (primary) hypertension: Secondary | ICD-10-CM

## 2020-05-17 NOTE — Progress Notes (Signed)
Discharge Progress Report  Patient Details  Name: Chase Clark MRN: [id number] Date of Birth: Nov 07, 1965 Referring Provider:   Flowsheet Row Cardiac Rehab from 02/23/2020 in [facility]  Referring Provider Bartholome Bill MD       Number of Visits: 9  Reason for Discharge:  Early Exit:  Lack of attendance  Smoking History:  Social History   Tobacco Use  Smoking Status Former Smoker  . Types: Cigarettes  . Quit date: 03/14/2008  . Years since quitting: 12.1  Smokeless Tobacco Never Used    Diagnosis:  S/P CABG x 2  ADL UCSD:   Initial Exercise Prescription:  Initial Exercise Prescription - 02/23/20 1000      Date of Initial Exercise RX and Referring Provider   Date 02/23/20    Referring Provider Bartholome Bill MD      Treadmill   MPH 2    Grade 0.5    Minutes 15    METs 2.67      NuStep   Level 2    SPM 80    Minutes 15    METs 2      T5 Nustep   Level 2    SPM 80    Minutes 15    METs 2      Biostep-RELP   Level 2    SPM 50    Minutes 15    METs 2      Prescription Details   Frequency (times per week) 3    Duration Progress to 30 minutes of continuous aerobic without signs/symptoms of physical distress      Intensity   THRR 40-80% of Max Heartrate 108-145    Ratings of Perceived Exertion 11-13    Perceived Dyspnea 0-4      Progression   Progression Continue to progress workloads to maintain intensity without signs/symptoms of physical distress.      Resistance Training   Training Prescription Yes    Weight 3 lb    Reps 10-15           Discharge Exercise Prescription (Final Exercise Prescription Changes):  Exercise Prescription Changes - 03/21/20 0900      Response to Exercise   Blood Pressure (Admit) 148/86    Blood Pressure (Exercise) 152/74    Blood Pressure (Exit) 142/72    Heart Rate (Admit) 71 bpm    Heart Rate (Exercise) 91 bpm    Heart Rate (Exit) 71 bpm    Rating of Perceived Exertion (Exercise)  13    Symptoms none    Duration Continue with 30 min of aerobic exercise without signs/symptoms of physical distress.    Intensity THRR unchanged      Progression   Progression Continue to progress workloads to maintain intensity without signs/symptoms of physical distress.    Average METs 2.54      Resistance Training   Training Prescription Yes    Weight 5 lb    Reps 10-15      Interval Training   Interval Training No      Treadmill   MPH 2    Grade 0.5    Minutes 15    METs 2.53      T5 Nustep   Level 2    Minutes 15    METs 2.4           Functional Capacity:  6 Minute Walk    Row Name 02/23/20 1005         6  Minute Walk   Phase Initial     Distance 1130 feet     Walk Time 6 minutes     # of Rest Breaks 0     MPH 2.14     METS 3.26     RPE 13     Perceived Dyspnea  2     VO2 Peak 11.41     Symptoms Yes (comment)     Comments hips sore 2/10, fatigue, SOB     Resting HR 70 bpm     Resting BP 126/64     Resting Oxygen Saturation  99 %     Exercise Oxygen Saturation  during 6 min walk 98 %     Max Ex. HR 86 bpm     Max Ex. BP 174/76     2 Minute Post BP 128/64            Psychological, QOL, Others - Outcomes: PHQ 2/9: Depression screen [facility] 2/9 02/23/2020 02/22/2019 11/19/2018 12/28/2017 09/25/2017  Decreased Interest 0 0 0 0 0  Down, Depressed, Hopeless 0 0 0 0 0  PHQ - 2 Score 0 0 0 0 0  Altered sleeping 0 - - - -  Tired, decreased energy 2 - - - -  Change in appetite 0 - - - -  Feeling bad or failure about yourself  0 - - - -  Trouble concentrating 0 - - - -  Moving slowly or fidgety/restless 0 - - - -  Suicidal thoughts 0 - - - -  PHQ-9 Score 2 - - - -  Difficult doing work/chores Not difficult at all - - - -  Some recent data might be hidden    Quality of Life:  Quality of Life - 02/23/20 1023      Quality of Life   Select --   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  Personal Goals and Risk Factors at Admission - 02/23/20 1025      Core Components/Risk Factors/Patient Goals on Admission    Weight Management Yes;Weight Loss    Intervention Weight Management: Develop a combined nutrition and exercise program designed to reach desired caloric intake, while maintaining appropriate intake of nutrient and fiber, sodium and fats, and appropriate energy expenditure required for the weight goal.;Weight Management: Provide education and appropriate resources to help participant work on and attain dietary goals.;Weight Management/Obesity: Establish reasonable short term and long term weight goals.;Obesity: Provide education and appropriate resources to help participant work on and attain dietary goals.    Admit Weight 231 lb 1.6 oz (104.8 kg)    Goal Weight: Short Term 225 lb (102.1 kg)    Goal Weight: Long Term 220 lb (99.8 kg)    Expected Outcomes Short Term: Continue to assess and modify interventions until short term weight is achieved;Long Term: Adherence to nutrition and physical activity/exercise program aimed toward attainment of established weight goal;Understanding recommendations for meals to include 15-35% energy as protein, 25-35% energy from fat, 35-60% energy from carbohydrates, less than 200mg  of dietary cholesterol, 20-35 gm of total fiber daily;Weight Loss: Understanding of general recommendations for a balanced deficit meal plan, which promotes 1-2 lb weight loss per week and includes a negative energy balance of [id number] kcal/d;Understanding of distribution of calorie intake throughout the day with  the consumption of 4-5 meals/snacks    Diabetes Yes    Intervention Provide education about signs/symptoms and action to take for hypo/hyperglycemia.;Provide education about proper nutrition, including hydration, and aerobic/resistive exercise prescription  along with prescribed medications to achieve blood glucose in normal ranges: Fasting glucose 65-99 mg/dL    Expected Outcomes Short Term: Participant verbalizes understanding of the signs/symptoms and immediate care of hyper/hypoglycemia, proper foot care and importance of medication, aerobic/resistive exercise and nutrition plan for blood glucose control.;Long Term: Attainment of HbA1C < 7%.    Hypertension Yes    Intervention Provide education on lifestyle modifcations including regular physical activity/exercise, weight management, moderate sodium restriction and increased consumption of fresh fruit, vegetables, and low fat dairy, alcohol moderation, and smoking cessation.;Monitor prescription use compliance.    Expected Outcomes Short Term: Continued assessment and intervention until BP is < 140/9mm HG in hypertensive participants. < 130/63mm HG in hypertensive participants with diabetes, heart failure or chronic kidney disease.;Long Term: Maintenance of blood pressure at goal levels.    Lipids Yes    Intervention Provide education and support for participant on nutrition & aerobic/resistive exercise along with prescribed medications to achieve LDL 70mg , HDL >40mg .    Expected Outcomes Short Term: Participant states understanding of desired cholesterol values and is compliant with medications prescribed. Participant is following exercise prescription and nutrition guidelines.;Long Term: Cholesterol controlled with medications as prescribed, with individualized exercise RX and with personalized nutrition plan. Value goals: LDL < 70mg , HDL > 40 mg.            Personal Goals Discharge:   Exercise Goals and Review:  Exercise Goals    Row Name 02/23/20 1018             Exercise Goals   Increase Physical Activity Yes       Intervention Provide advice, education, support and counseling about physical activity/exercise needs.;Develop an individualized exercise prescription for aerobic and  resistive training based on initial evaluation findings, risk stratification, comorbidities and participant's personal goals.       Expected Outcomes Short Term: Attend rehab on a regular basis to increase amount of physical activity.;Long Term: Add in home exercise to make exercise part of routine and to increase amount of physical activity.;Long Term: Exercising regularly at least 3-5 days a week.       Increase Strength and Stamina Yes       Intervention Provide advice, education, support and counseling about physical activity/exercise needs.;Develop an individualized exercise prescription for aerobic and resistive training based on initial evaluation findings, risk stratification, comorbidities and participant's personal goals.       Expected Outcomes Short Term: Increase workloads from initial exercise prescription for resistance, speed, and METs.;Short Term: Perform resistance training exercises routinely during rehab and add in resistance training at home;Long Term: Improve cardiorespiratory fitness, muscular endurance and strength as measured by increased METs and functional capacity (6MWT)       Able to understand and use rate of perceived exertion (RPE) scale Yes       Intervention Provide education and explanation on how to use RPE scale       Expected Outcomes Short Term: Able to use RPE daily in rehab to express subjective intensity level;Long Term:  Able to use RPE to guide intensity level when exercising independently       Able to understand and use Dyspnea scale Yes       Intervention Provide education and explanation on how to use Dyspnea scale  Expected Outcomes Short Term: Able to use Dyspnea scale daily in rehab to express subjective sense of shortness of breath during exertion;Long Term: Able to use Dyspnea scale to guide intensity level when exercising independently       Knowledge and understanding of Target Heart Rate Range (THRR) Yes       Intervention Provide education and  explanation of THRR including how the numbers were predicted and where they are located for reference       Expected Outcomes Short Term: Able to state/look up THRR;Short Term: Able to use daily as guideline for intensity in rehab;Long Term: Able to use THRR to govern intensity when exercising independently       Able to check pulse independently Yes       Intervention Provide education and demonstration on how to check pulse in carotid and radial arteries.;Review the importance of being able to check your own pulse for safety during independent exercise       Expected Outcomes Short Term: Able to explain why pulse checking is important during independent exercise;Long Term: Able to check pulse independently and accurately       Understanding of Exercise Prescription Yes       Intervention Provide education, explanation, and written materials on patient's individual exercise prescription       Expected Outcomes Short Term: Able to explain program exercise prescription;Long Term: Able to explain home exercise prescription to exercise independently              Exercise Goals Re-Evaluation:  Exercise Goals Re-Evaluation    Row Name 02/24/20 0818 03/05/20 1711 03/21/20 0901 04/17/20 1600 05/14/20 1549     Exercise Goal Re-Evaluation   Exercise Goals Review Understanding of Exercise Prescription;Knowledge and understanding of Target Heart Rate Range (THRR);Able to understand and use Dyspnea scale;Able to understand and use rate of perceived exertion (RPE) scale Increase Physical Activity;Increase Strength and Stamina Increase Physical Activity;Increase Strength and Stamina;Understanding of Exercise Prescription -- --   Comments Reviewed RPE and dyspnea scales, THR and program prescription with pt today.  Pt voiced understanding and was given a copy of goals to take home. Jalani has tolerated exercise well in his first week.  Staff will monitor progress. [place] has tested positive for COVID and will be  out.  He was up to 2.4 METs on the T5 NuStep.  We will continue to monitor his progress upon return. Out sine last review Out sine last review   Expected Outcomes Short: Use RPE daily to regulate intensity. Long: Follow program prescription in THR. Short: attend consistently Long: increase overall stamina Short; Return once cleared to regular attendance  Long: Continue to improve stamina. -- --          Nutrition & Weight - Outcomes:  Pre Biometrics - 02/23/20 1019      Pre Biometrics   Height 5\' 11"  (1.803 m)    Weight 231 lb 1.6 oz (104.8 kg)    BMI (Calculated) 32.25    Single Leg Stand 0.5 seconds   only did good leg           Nutrition:  Nutrition Therapy & Goals - 02/23/20 1025      Intervention Plan   Intervention Prescribe, educate and counsel regarding individualized specific dietary modifications aiming towards targeted core components such as weight, hypertension, lipid management, diabetes, heart failure and other comorbidities.    Expected Outcomes Short Term Goal: Understand basic principles of dietary content, such as calories, fat, sodium, cholesterol  and nutrients.;Short Term Goal: A plan has been developed with personal nutrition goals set during dietitian appointment.;Long Term Goal: Adherence to prescribed nutrition plan.           Nutrition Discharge:  Nutrition Assessments - 02/23/20 1023      MEDFICTS Scores   Pre Score 39           Education Questionnaire Score:  Knowledge Questionnaire Score - 02/23/20 1024      Knowledge Questionnaire Score   Pre Score 23/26 Education Focus: Nutrition and Exercise           Goals reviewed with patient; copy given to patient.

## 2020-05-17 NOTE — Patient Instructions (Signed)
Visit Information  Goals Addressed              This Visit's Progress     Patient Stated   .  Medication Monitoring (pt-stated)         Patient Goals/Self-Care Activities . Over the next 90 days, patient will:  - take medications as prescribed check glucose TID using CGM, document, and provide at future appointments check blood pressure daily, document, and provide at future appointments Communicate with specialty providers       Patient verbalizes understanding of instructions provided today and agrees to view in [place].   Plan: Telephone follow up appointment with care management team member scheduled for:  ~ 10 weeks  Catie Darnelle Maffucci, PharmD, [place], [place] Clinical Pharmacist [employer] [facility] [phone]

## 2020-05-17 NOTE — Progress Notes (Signed)
Cardiac Individual Treatment Plan  Patient Details  Name: Chase Clark MRN: [id number] Date of Birth: [birth year] Referring Provider:   Flowsheet Row Cardiac Rehab from 02/23/2020 in [facility] Cardiac and Pulmonary Rehab  Referring Provider Bartholome Bill MD      Initial Encounter Date:  Flowsheet Row Cardiac Rehab from 02/23/2020 in [facility] Cardiac and Pulmonary Rehab  Date 02/23/20      Visit Diagnosis: S/P CABG x 2  Patient's Home Medications on Admission:  Current Outpatient Medications:  .  acetaminophen (TYLENOL) 325 MG tablet, Take 1 tablet (325 mg total) by mouth every 6 (six) hours as needed for mild pain (pain score 1-3 or temp > 100.5)., Disp: 30 tablet, Rfl: 0 .  amLODipine (NORVASC) 5 MG tablet, Take 2.5 mg by mouth daily., Disp: , Rfl:  .  aspirin (ASPIRIN CHILDRENS) 81 MG chewable tablet, Chew 1 tablet (81 mg total) by mouth daily., Disp: 30 tablet, Rfl: 1 .  atorvastatin (LIPITOR) 80 MG tablet, TAKE 1 TABLET BY MOUTH EVERY DAY, Disp: 90 tablet, Rfl: 1 .  brimonidine-timolol (COMBIGAN) 0.2-0.5 % ophthalmic solution, Place 1 drop into the right eye every 12 (twelve) hours. , Disp: , Rfl:  .  cholecalciferol (VITAMIN D3) 25 MCG (1000 UT) tablet, Take 1,000 Units by mouth daily., Disp: , Rfl:  .  Continuous Blood Gluc Sensor (DEXCOM G6 SENSOR) MISC, Inject 1 Device into the skin as directed. Place a new sensor every 10 days to check blood sugar at least 4 times daily, Disp: 9 each, Rfl: 3 .  Continuous Blood Gluc Transmit (DEXCOM G6 TRANSMITTER) MISC, Inject 1 Device into the skin as directed. Use to check blood sugar at least 4 times daily, Disp: 1 each, Rfl: 3 .  dorzolamide (TRUSOPT) 2 % ophthalmic solution, 1 drop 2 (two) times daily., Disp: , Rfl:  .  ezetimibe (ZETIA) 10 MG tablet, Take 1 tablet (10 mg total) by mouth daily., Disp: 90 tablet, Rfl: 3 .  fluticasone (FLONASE) 50 MCG/ACT nasal spray, SPRAY 2 SPRAYS INTO EACH NOSTRIL EVERY DAY, Disp: 48 mL, Rfl: 2 .  gabapentin  (NEURONTIN) 300 MG capsule, Take 1 capsule (300 mg total) by mouth 2 (two) times daily., Disp: 60 capsule, Rfl: 6 .  hydrALAZINE (APRESOLINE) 25 MG tablet, Take 12.5 mg by mouth in the morning and at bedtime., Disp: , Rfl:  .  Insulin Glargine (BASAGLAR KWIKPEN) 100 UNIT/ML, Inject 40 Units into the skin 2 (two) times daily. Further refills to come from endocrinology, Disp: 75 mL, Rfl: 3 .  insulin lispro (HUMALOG KWIKPEN) 100 UNIT/ML KwikPen, Inject 6 Units into the skin 3 (three) times daily. Inject up to 15 units TID with meals. Further refills to come from endocrinology, Disp: 30 mL, Rfl: 2 .  Insulin Pen Needle 32G X 4 MM MISC, Inject 5 pens into the skin daily. Use to inject Lantus and Novolog up to 5 times daily, Disp: 500 each, Rfl: 1 .  Lactulose 20 GM/30ML SOLN, Take 30 mLs (20 g total) by mouth as needed. (Patient not taking: Reported on 04/12/2020), Disp: 450 mL, Rfl: 1 .  latanoprost (XALATAN) 0.005 % ophthalmic solution, Place 1 drop into both eyes at bedtime., Disp: , Rfl: 5 .  lisinopril (ZESTRIL) 5 MG tablet, Take 1 tablet (5 mg total) by mouth daily., Disp: 90 tablet, Rfl: 1 .  metoprolol tartrate (LOPRESSOR) 100 MG tablet, TAKE 0.5 TABLETS (50 MG TOTAL) BY MOUTH 2 (TWO) TIMES DAILY., Disp: 90 tablet, Rfl: 1 .  Multiple  Vitamin (MULTIVITAMIN) tablet, Take 1 tablet by mouth daily., Disp: , Rfl:  .  omeprazole (PRILOSEC) 20 MG capsule, Take 20 mg by mouth daily., Disp: , Rfl:  .  senna (SENOKOT) 8.6 MG tablet, Take 1 tablet by mouth daily. , Disp: , Rfl:  .  Sodium Sulfate-Mag Sulfate-KCl (SUTAB) [phone] MG TABS, Use as directed for colonoscopy. MANUFACTURER CODES!! BIN: [id number] PCN: CN GROUP: [id number] MEMBER ID: [id number];VHQ AS SECONDARY INSURANCE ;NO PRIOR AUTHORIZATION, Disp: 24 tablet, Rfl: 0 .  torsemide (DEMADEX) 20 MG tablet, Take 20 mg by mouth., Disp: , Rfl:   Past Medical History: Past Medical History:  Diagnosis Date  . Arthritis   . Asthma    as child  .  Barrett's esophagus 02/16/2014   short segment  . Chronic kidney disease (CKD), stage IV (severe) ([place])   . Coronary artery disease    DES DIAG1 11/20/09 [facility])  . DDD (degenerative disc disease), cervical   . Diabetes mellitus without complication ([place])   . Diabetic osteomyelitis ([place])   . Diabetic peripheral neuropathy ([place])   . Diabetic ulcer of foot with muscle involvement without evidence of necrosis ([place])    DIABETIC ULCERATIONS ASSOCIATED WITH IRRITATION LATERAL ANKLE LEFT GREATER THAN RIGHT WITH MILD CELLULITIS   . Diverticulosis   . DKA (diabetic ketoacidoses) 08/23/2017  . Edema, lower extremity   . Fatty liver   . Gastroparesis 2015  . GERD (gastroesophageal reflux disease)   . Gilbert's disease   . Glaucoma   . Heart disease 2011   Patient has stent for 80% blockage  . Hyperlipidemia   . Hypertension   . Left below-knee amputee ([place])   . Neuropathy   . Osteoarthritis of spine   . Osteomyelitis of left foot ([place])   . PVD (peripheral vascular disease) ([place])   . Seasonal allergies   . Shoulder pain   . Ulcer of foot ([place]) 08.06.2014   DIABETIC ULCERATIONS ASSOCIATED WITH IRRITATION LATERAL ANKLE LEFT GREATER THAN RIGHT WITH MILD CELLULITIS    Tobacco Use: Social History   Tobacco Use  Smoking Status Former Smoker  . Types: Cigarettes  . Quit date: 03/14/2008  . Years since quitting: 12.1  Smokeless Tobacco Never Used    Labs: Recent Chemical engineer    Labs for ITP Cardiac and Pulmonary Rehab Latest Ref Rng & Units 01/27/2019 01/27/2019 05/24/2019 02/22/2020 03/15/2020   Cholestrol 0 - 200 mg/dL - - 126 - 161   LDLCALC 0 - 99 mg/dL - - 55 - -   LDLDIRECT mg/dL - - - - 75.0   HDL >39.00 mg/dL - - 31.70(L) - 34.10(L)   Trlycerides 0.0 - 149.0 mg/dL - - 199.0(H) - 372.0(H)   Hemoglobin A1c 4.0 - 5.6 % - - 5.9 9.0(A) -   PHART 7.350 - 7.450 7.356 7.330(L) - - -   PCO2ART 32.0 - 48.0 mmHg 42.9 44.0 - - -   HCO3 20.0 - 28.0 mmol/L 24.2 23.3 - - -   TCO2 22 -  32 mmol/L 26 25 - - -   ACIDBASEDEF 0.0 - 2.0 mmol/L 1.0 3.0(H) - - -   O2SAT % 98.0 93.0 - - -       Exercise Target Goals: Exercise Program Goal: Individual exercise prescription set using results from initial 6 min walk test and THRR while considering  patient's activity barriers and safety.   Exercise Prescription Goal: Initial exercise prescription builds to 30-45 minutes a day of aerobic activity, 2-3 days per week.  Home exercise guidelines will be given to patient during program as part of exercise prescription that the participant will acknowledge.   Education: Aerobic Exercise: - Group verbal and visual presentation on the components of exercise prescription. Introduces F.I.T.T principle from ACSM for exercise prescriptions.  Reviews F.I.T.T. principles of aerobic exercise including progression. Written material given at graduation.   Education: Resistance Exercise: - Group verbal and visual presentation on the components of exercise prescription. Introduces F.I.T.T principle from ACSM for exercise prescriptions  Reviews F.I.T.T. principles of resistance exercise including progression. Written material given at graduation.    Education: Exercise & Equipment Safety: - Individual verbal instruction and demonstration of equipment use and safety with use of the equipment. Flowsheet Row Cardiac Rehab from 03/14/2020 in [facility] Cardiac and Pulmonary Rehab  Date 02/23/20  Educator [facility]  Instruction Review Code 1- Verbalizes Understanding      Education: Exercise Physiology & General Exercise Guidelines: - Group verbal and written instruction with models to review the exercise physiology of the cardiovascular system and associated critical values. Provides general exercise guidelines with specific guidelines to those with heart or lung disease.  Flowsheet Row Cardiac Rehab from 03/14/2020 in [facility] Cardiac and Pulmonary Rehab  Education need identified 02/23/20      Education:  Flexibility, Balance, Mind/Body Relaxation: - Group verbal and visual presentation with interactive activity on the components of exercise prescription. Introduces F.I.T.T principle from ACSM for exercise prescriptions. Reviews F.I.T.T. principles of flexibility and balance exercise training including progression. Also discusses the mind body connection.  Reviews various relaxation techniques to help reduce and manage stress (i.e. Deep breathing, progressive muscle relaxation, and visualization). Balance handout provided to take home. Written material given at graduation.   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 HR 86 bpm     Max Ex. BP 174/76     2 Minute Post BP 128/64            Oxygen Initial Assessment:   Oxygen Re-Evaluation:   Oxygen Discharge (Final Oxygen Re-Evaluation):   Initial Exercise Prescription:  Initial Exercise Prescription - 02/23/20 1000      Date of Initial Exercise RX and Referring Provider   Date 02/23/20    Referring Provider Bartholome Bill MD  Treadmill   MPH 2    Grade 0.5     Minutes 15    METs 2.67      NuStep   Level 2    SPM 80    Minutes 15    METs 2      T5 Nustep   Level 2    SPM 80    Minutes 15    METs 2      Biostep-RELP   Level 2    SPM 50    Minutes 15    METs 2      Prescription Details   Frequency (times per week) 3    Duration Progress to 30 minutes of continuous aerobic without signs/symptoms of physical distress      Intensity   THRR 40-80% of Max Heartrate 108-145    Ratings of Perceived Exertion 11-13    Perceived Dyspnea 0-4      Progression   Progression Continue to progress workloads to maintain intensity without signs/symptoms of physical distress.      Resistance Training   Training Prescription Yes    Weight 3 lb    Reps 10-15           Perform Capillary Blood Glucose checks as needed.  Exercise Prescription Changes:  Exercise Prescription Changes    Row Name 02/23/20 1000 03/05/20 1700 03/21/20 0900         Response to Exercise   Blood Pressure (Admit) 126/64 128/70 148/86     Blood Pressure (Exercise) 174/76 138/66 152/74     Blood Pressure (Exit) 128/74 140/80 142/72     Heart Rate (Admit) 70 bpm 75 bpm 71 bpm     Heart Rate (Exercise) 86 bpm 89 bpm 91 bpm     Heart Rate (Exit) 69 bpm 78 bpm 71 bpm     Oxygen Saturation (Admit) 99 % -- --     Oxygen Saturation (Exercise) 98 % -- --     Rating of Perceived Exertion (Exercise) 13 13 13      Perceived Dyspnea (Exercise) 2 -- --     Symptoms hips sore (2/10), fatigue, SOB none none     Comments walk test results -- --     Duration -- -- Continue with 30 min of aerobic exercise without signs/symptoms of physical distress.     Intensity -- -- THRR unchanged           Progression   Progression -- Continue to progress workloads to maintain intensity without signs/symptoms of physical distress. Continue to progress workloads to maintain intensity without signs/symptoms of physical distress.     Average METs -- 3.6 2.54           Resistance Training    Training Prescription -- Yes Yes     Weight -- 5 lb 5 lb     Reps -- 10-15 10-15           Interval Training   Interval Training -- -- No           Treadmill   MPH -- -- 2     Grade -- -- 0.5     Minutes -- -- 15     METs -- -- 2.53           NuStep   Level -- 2 --     SPM -- 80 --     Minutes -- 15 --     METs -- 4.2 --  T5 Nustep   Level -- -- 2     Minutes -- -- 15     METs -- -- 2.4           Biostep-RELP   Level -- 3 --     SPM -- 50 --     Minutes -- 15 --     METs -- 3 --            Exercise Comments:  Exercise Comments    Row Name 02/24/20 0818 03/05/20 [id number]         Exercise Comments First full day of exercise!  Patient was oriented to gym and equipment including functions, settings, policies, and procedures.  Patient's individual exercise prescription and treatment plan were reviewed.  All starting workloads were established based on the results of the 6 minute walk test done at initial orientation visit.  The plan for exercise progression was also introduced and progression will be customized based on patient's performance and goals. Weight up 7 pounds from last visit. Tracey stated he did feel some fluid. He will go home and discuss with his wife about takng an extra lasix. He has new kidney doctor appointment this Wednesday             Exercise Goals and Review:  Exercise Goals    Row Name 02/23/20 1018             Exercise Goals   Increase Physical Activity Yes       Intervention Provide advice, education, support and counseling about physical activity/exercise needs.;Develop an individualized exercise prescription for aerobic and resistive training based on initial evaluation findings, risk stratification, comorbidities and participant's personal goals.       Expected Outcomes Short Term: Attend rehab on a regular basis to increase amount of physical activity.;Long Term: Add in home exercise to make exercise part of routine and to  increase amount of physical activity.;Long Term: Exercising regularly at least 3-5 days a week.       Increase Strength and Stamina Yes       Intervention Provide advice, education, support and counseling about physical activity/exercise needs.;Develop an individualized exercise prescription for aerobic and resistive training based on initial evaluation findings, risk stratification, comorbidities and participant's personal goals.       Expected Outcomes Short Term: Increase workloads from initial exercise prescription for resistance, speed, and METs.;Short Term: Perform resistance training exercises routinely during rehab and add in resistance training at home;Long Term: Improve cardiorespiratory fitness, muscular endurance and strength as measured by increased METs and functional capacity (6MWT)       Able to understand and use rate of perceived exertion (RPE) scale Yes       Intervention Provide education and explanation on how to use RPE scale       Expected Outcomes Short Term: Able to use RPE daily in rehab to express subjective intensity level;Long Term:  Able to use RPE to guide intensity level when exercising independently       Able to understand and use Dyspnea scale Yes       Intervention Provide education and explanation on how to use Dyspnea scale       Expected Outcomes Short Term: Able to use Dyspnea scale daily in rehab to express subjective sense of shortness of breath during exertion;Long Term: Able to use Dyspnea scale to guide intensity level when exercising independently       Knowledge and understanding of Target Heart Rate Range (THRR) Yes  Intervention Provide education and explanation of THRR including how the numbers were predicted and where they are located for reference       Expected Outcomes Short Term: Able to state/look up THRR;Short Term: Able to use daily as guideline for intensity in rehab;Long Term: Able to use THRR to govern intensity when exercising  independently       Able to check pulse independently Yes       Intervention Provide education and demonstration on how to check pulse in carotid and radial arteries.;Review the importance of being able to check your own pulse for safety during independent exercise       Expected Outcomes Short Term: Able to explain why pulse checking is important during independent exercise;Long Term: Able to check pulse independently and accurately       Understanding of Exercise Prescription Yes       Intervention Provide education, explanation, and written materials on patient's individual exercise prescription       Expected Outcomes Short Term: Able to explain program exercise prescription;Long Term: Able to explain home exercise prescription to exercise independently              Exercise Goals Re-Evaluation :  Exercise Goals Re-Evaluation    Row Name 02/24/20 0818 03/05/20 1711 03/21/20 0901 04/17/20 1600 05/14/20 1549     Exercise Goal Re-Evaluation   Exercise Goals Review Understanding of Exercise Prescription;Knowledge and understanding of Target Heart Rate Range (THRR);Able to understand and use Dyspnea scale;Able to understand and use rate of perceived exertion (RPE) scale Increase Physical Activity;Increase Strength and Stamina Increase Physical Activity;Increase Strength and Stamina;Understanding of Exercise Prescription -- --   Comments Reviewed RPE and dyspnea scales, THR and program prescription with pt today.  Pt voiced understanding and was given a copy of goals to take home. Mete has tolerated exercise well in his first week.  Staff will monitor progress. Hardeep has tested positive for COVID and will be out.  He was up to 2.4 METs on the T5 NuStep.  We will continue to monitor his progress upon return. Out sine last review Out sine last review   Expected Outcomes Short: Use RPE daily to regulate intensity. Long: Follow program prescription in THR. Short: attend consistently Long: increase  overall stamina Short; Return once cleared to regular attendance  Long: Continue to improve stamina. -- --          Discharge Exercise Prescription (Final Exercise Prescription Changes):  Exercise Prescription Changes - 03/21/20 0900      Response to Exercise   Blood Pressure (Admit) 148/86    Blood Pressure (Exercise) 152/74    Blood Pressure (Exit) 142/72    Heart Rate (Admit) 71 bpm    Heart Rate (Exercise) 91 bpm    Heart Rate (Exit) 71 bpm    Rating of Perceived Exertion (Exercise) 13    Symptoms none    Duration Continue with 30 min of aerobic exercise without signs/symptoms of physical distress.    Intensity THRR unchanged      Progression   Progression Continue to progress workloads to maintain intensity without signs/symptoms of physical distress.    Average METs 2.54      Resistance Training   Training Prescription Yes    Weight 5 lb    Reps 10-15      Interval Training   Interval Training No      Treadmill   MPH 2    Grade 0.5    Minutes 15  METs 2.53      T5 Nustep   Level 2    Minutes 15    METs 2.4           Nutrition:  Target Goals: Understanding of nutrition guidelines, daily intake of sodium 1500mg , cholesterol 200mg , calories 30% from fat and 7% or less from saturated fats, daily to have 5 or more servings of fruits and vegetables.  Education: All About Nutrition: -Group instruction provided by verbal, written material, interactive activities, discussions, models, and posters to present general guidelines for heart healthy nutrition including fat, fiber, MyPlate, the role of sodium in heart healthy nutrition, utilization of the nutrition label, and utilization of this knowledge for meal planning. Follow up email sent as well. Written material given at graduation. Flowsheet Row Cardiac Rehab from 03/14/2020 in [facility] Cardiac and Pulmonary Rehab  Education need identified 02/23/20      Biometrics:  Pre Biometrics - 02/23/20 1019      Pre  Biometrics   Height 5\' 11"  (1.803 m)    Weight 231 lb 1.6 oz (104.8 kg)    BMI (Calculated) 32.25    Single Leg Stand 0.5 seconds   only did good leg           Nutrition Therapy Plan and Nutrition Goals:  Nutrition Therapy & Goals - 02/23/20 1025      Intervention Plan   Intervention Prescribe, educate and counsel regarding individualized specific dietary modifications aiming towards targeted core components such as weight, hypertension, lipid management, diabetes, heart failure and other comorbidities.    Expected Outcomes Short Term Goal: Understand basic principles of dietary content, such as calories, fat, sodium, cholesterol and nutrients.;Short Term Goal: A plan has been developed with personal nutrition goals set during dietitian appointment.;Long Term Goal: Adherence to prescribed nutrition plan.           Nutrition Assessments:  Nutrition Assessments - 02/23/20 1023      MEDFICTS Scores   Pre Score 39          MEDIFICTS Score Key:  ?70 Need to make dietary changes   40-70 Heart Healthy Diet  ? 40 Therapeutic Level Cholesterol Diet   Picture Your Plate Scores:  <25 Unhealthy dietary pattern with much room for improvement.  41-50 Dietary pattern unlikely to meet recommendations for good health and room for improvement.  51-60 More healthful dietary pattern, with some room for improvement.   >60 Healthy dietary pattern, although there may be some specific behaviors that could be improved.    Nutrition Goals Re-Evaluation:   Nutrition Goals Discharge (Final Nutrition Goals Re-Evaluation):   Psychosocial: Target Goals: Acknowledge presence or absence of significant depression and/or stress, maximize coping skills, provide positive support system. Participant is able to verbalize types and ability to use techniques and skills needed for reducing stress and depression.   Education: Stress, Anxiety, and Depression - Group verbal and visual presentation to  define topics covered.  Reviews how body is impacted by stress, anxiety, and depression.  Also discusses healthy ways to reduce stress and to treat/manage anxiety and depression.  Written material given at graduation.   Education: Sleep Hygiene -Provides group verbal and written instruction about how sleep can affect your health.  Define sleep hygiene, discuss sleep cycles and impact of sleep habits. Review good sleep hygiene tips.    Initial Review & Psychosocial Screening:  Initial Psych Review & Screening - 02/10/20 1018      Initial Review   Current issues with Current  Stress Concerns    Source of Stress Concerns Chronic Illness;Occupation;Unable to participate in former interests or hobbies;Unable to perform yard/household activities      [place]? Yes   wife     Barriers   Psychosocial barriers to participate in program There are no identifiable barriers or psychosocial needs.;The patient should benefit from training in stress management and relaxation.      Screening Interventions   Interventions Encouraged to exercise;Provide feedback about the scores to participant;To provide support and resources with identified psychosocial needs    Expected Outcomes Short Term goal: Utilizing psychosocial counselor, staff and physician to assist with identification of specific Stressors or current issues interfering with healing process. 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  A difference of  2-3 points is a clinically meaningful difference.  A difference of 2-3 points in the total score of the Quality of Life Index has been associated with significant improvement in overall quality of life, self-image, physical symptoms, and general health in studies assessing change in quality of life.  PHQ-9: Recent Review Flowsheet Data    Depression screen [facility] 2/9 02/23/2020 02/22/2019 11/19/2018 12/28/2017 09/25/2017   Decreased Interest 0 0 0 0 0   Down, Depressed, Hopeless 0 0 0 0 0   PHQ - 2 Score 0 0 0 0 0   Altered sleeping 0 - - - -   Tired, decreased energy 2 - - - -   Change in appetite 0 - - - -   Feeling bad or failure about yourself  0 - - - -   Trouble concentrating 0 - - - -   Moving slowly or fidgety/restless 0 - - - -   Suicidal thoughts 0 - - - -   PHQ-9 Score 2 - - - -   Difficult doing work/chores Not difficult at all - - - -     Interpretation of Total Score  Total Score Depression Severity:  1-4 = Minimal depression, 5-9 = Mild depression, 10-14 = Moderate depression, 15-19 = Moderately severe depression, 20-27 = Severe depression   Psychosocial Evaluation and Intervention:  Psychosocial Evaluation - 02/10/20 1019      Psychosocial Evaluation & Interventions   Interventions Stress management education;Relaxation education;Encouraged to exercise with the program and follow exercise prescription    Comments Santhosh is doing well post CABG from 01/2019. He was unable to attend cardiac rehab due to COVID, but is looking forward to starting it now. 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 He admits to being more sedentary this last year due to [place] so  he is really wanting to boost his stamina  through exercise. He is currently on long term disability from work and doesn't know if he can go back due to his worsening vision. Insurance has also been giving him issues with his diabetes medication but he hopes to resolve it soon.    Expected Outcomes Short: attend cardiac rehab for education and exercise. Long: develop positive self care habits.    Continue Psychosocial Services  Follow up required by staff           Psychosocial Re-Evaluation:   Psychosocial Discharge (Final Psychosocial Re-Evaluation):   Vocational Rehabilitation: Provide vocational rehab assistance to qualifying candidates.   Vocational Rehab Evaluation & Intervention:  Vocational Rehab - 02/10/20 1017      Initial Vocational Rehab Evaluation & Intervention   Assessment shows need for Vocational Rehabilitation No           Education: Education Goals: Education classes will be provided on a variety of topics geared toward better understanding of heart health and risk factor modification. Participant will state understanding/return demonstration of topics presented as noted by education test scores.  Learning Barriers/Preferences:  Learning Barriers/Preferences - 02/10/20 1016      Learning Barriers/Preferences   Learning Barriers Sight    Learning Preferences Audio;Group Instruction;Individual Instruction;Pictoral;Skilled Demonstration;Verbal Instruction           General Cardiac Education Topics:  AED/CPR: - Group verbal and written instruction with the use of models to demonstrate the basic use of the AED with the basic ABC's of resuscitation.   Anatomy and Cardiac Procedures: - Group verbal and visual presentation and models provide information about basic cardiac anatomy and function. Reviews the testing methods done to diagnose heart disease and the outcomes of the test results. Describes the treatment choices: Medical Management, Angioplasty, or Coronary Bypass Surgery for treating  various heart conditions including Myocardial Infarction, Angina, Valve Disease, and Cardiac Arrhythmias.  Written material given at graduation.   Medication Safety: - Group verbal and visual instruction to review commonly prescribed medications for heart and lung disease. Reviews the medication, class of the drug, and side effects. Includes the steps to properly store meds and maintain the prescription regimen.  Written material given at graduation. Flowsheet Row Cardiac Rehab from 03/14/2020 in [facility] Cardiac and Pulmonary Rehab  Date 02/29/20  Educator SB  Instruction Review Code 1- Verbalizes Understanding      Intimacy: - Group verbal instruction through game format to discuss how heart and lung disease can affect sexual intimacy. Written material given at graduation..   Know Your Numbers and Heart Failure: - Group verbal and visual instruction to discuss disease risk factors for cardiac and pulmonary disease and treatment options.  Reviews associated critical values for Overweight/Obesity, Hypertension, Cholesterol, and Diabetes.  Discusses basics of heart failure: signs/symptoms and treatments.  Introduces Heart Failure Zone chart for action plan for heart failure.  Written material given at graduation.   Infection Prevention: - Provides verbal and written material to individual with discussion of infection control including proper hand washing and proper equipment cleaning during exercise session. Flowsheet Row Cardiac Rehab from 03/14/2020 in [facility] Cardiac and Pulmonary Rehab  Date 02/23/20  Educator [facility]  Instruction Review Code 1- Verbalizes Understanding      Falls Prevention: - Provides verbal and written material to individual with discussion of falls prevention and safety. Flowsheet Row Cardiac Rehab from 03/14/2020 in [facility] Cardiac and Pulmonary Rehab  Date 02/23/20  Educator [facility]  Instruction Review Code 1-  Verbalizes Understanding      Other: -Provides group and verbal  instruction on various topics (see comments)   Knowledge Questionnaire Score:  Knowledge Questionnaire Score - 02/23/20 1024      Knowledge Questionnaire Score   Pre Score 23/26 Education Focus: Nutrition and Exercise           Core Components/Risk Factors/Patient Goals at Admission:  Personal Goals and Risk Factors at Admission - 02/23/20 1025      Core Components/Risk Factors/Patient Goals on Admission    Weight Management Yes;Weight Loss    Intervention Weight Management: Develop a combined nutrition and exercise program designed to reach desired caloric intake, while maintaining appropriate intake of nutrient and fiber, sodium and fats, and appropriate energy expenditure required for the weight goal.;Weight Management: Provide education and appropriate resources to help participant work on and attain dietary goals.;Weight Management/Obesity: Establish reasonable short term and long term weight goals.;Obesity: Provide education and appropriate resources to help participant work on and attain dietary goals.    Admit Weight 231 lb 1.6 oz (104.8 kg)    Goal Weight: Short Term 225 lb (102.1 kg)    Goal Weight: Long Term 220 lb (99.8 kg)    Expected Outcomes Short Term: Continue to assess and modify interventions until short term weight is achieved;Long Term: Adherence to nutrition and physical activity/exercise program aimed toward attainment of established weight goal;Understanding recommendations for meals to include 15-35% energy as protein, 25-35% energy from fat, 35-60% energy from carbohydrates, less than 200mg  of dietary cholesterol, 20-35 gm of total fiber daily;Weight Loss: Understanding of general recommendations for a balanced deficit meal plan, which promotes 1-2 lb weight loss per week and includes a negative energy balance of [phone] kcal/d;Understanding of distribution of calorie intake throughout the day with the consumption of 4-5 meals/snacks    Diabetes Yes    Intervention  Provide education about signs/symptoms and action to take for hypo/hyperglycemia.;Provide education about proper nutrition, including hydration, and aerobic/resistive exercise prescription along with prescribed medications to achieve blood glucose in normal ranges: Fasting glucose 65-99 mg/dL    Expected Outcomes Short Term: Participant verbalizes understanding of the signs/symptoms and immediate care of hyper/hypoglycemia, proper foot care and importance of medication, aerobic/resistive exercise and nutrition plan for blood glucose control.;Long Term: Attainment of HbA1C < 7%.    Hypertension Yes    Intervention Provide education on lifestyle modifcations including regular physical activity/exercise, weight management, moderate sodium restriction and increased consumption of fresh fruit, vegetables, and low fat dairy, alcohol moderation, and smoking cessation.;Monitor prescription use compliance.    Expected Outcomes Short Term: Continued assessment and intervention until BP is < 140/49mm HG in hypertensive participants. < 130/54mm HG in hypertensive participants with diabetes, heart failure or chronic kidney disease.;Long Term: Maintenance of blood pressure at goal levels.    Lipids Yes    Intervention Provide education and support for participant on nutrition & aerobic/resistive exercise along with prescribed medications to achieve LDL 70mg , HDL >40mg .    Expected Outcomes Short Term: Participant states understanding of desired cholesterol values and is compliant with medications prescribed. Participant is following exercise prescription and nutrition guidelines.;Long Term: Cholesterol controlled with medications as prescribed, with individualized exercise RX and with personalized nutrition plan. Value goals: LDL < 70mg , HDL > 40 mg.           Education:Diabetes - Individual verbal and written instruction to review signs/symptoms of diabetes, desired ranges of glucose level fasting, after meals and  with exercise. Acknowledge that pre and  post exercise glucose checks will be done for 3 sessions at entry of program. Flowsheet Row Cardiac Rehab from 03/14/2020 in [facility] Cardiac and Pulmonary Rehab  Date 02/10/20  Educator [facility]  Instruction Review Code 1- Verbalizes Understanding      Core Components/Risk Factors/Patient Goals Review:    Core Components/Risk Factors/Patient Goals at Discharge (Final Review):    ITP Comments:  ITP Comments    Row Name 02/10/20 0952 02/23/20 1005 02/24/20 0817 03/05/20 0821 03/14/20 0948   ITP Comments Initial telephone orientation completed. Diagnosis can be found in [facility] 10/7. EP orientation scheduled for 11/2 at 8am Completed 6MWT and gym orientation. Initial ITP created and sent for review to Dr. Emily Filbert, Medical Director. First full day of exercise!  Patient was oriented to gym and equipment including functions, settings, policies, and procedures.  Patient's individual exercise prescription and treatment plan were reviewed.  All starting workloads were established based on the results of the 6 minute walk test done at initial orientation visit.  The plan for exercise progression was also introduced and progression will be customized based on patient's performance and goals. Weight up 7 pounds from last visit. Keil stated he did feel some fluid. He will go home and discuss with his wife about takng an extra lasix. He has new kidney doctor appointment this Wednesday 30 Day review completed. Medical Director ITP review done, changes made as directed, and signed approval by Medical Director.   Row Name 03/19/20 1213 04/04/20 1250 04/11/20 0558 04/11/20 1326 04/17/20 1559   ITP Comments Pt's wife left message.  Shakeem has tested positive for COVID.  Message said he would be back next week.  Left message for them that we need test dates to verify return dates. [place] has been absent since last review. 30 Day review completed. Medical Director ITP review done, changes  made as directed, and signed approval by Medical Director. Patient's wife called, patient has been out of rehab due to testing positive for COVID on 03/21/20. Verified patient had no symptoms and is welcome back on 04/16/20. Patient's wife aware. Set to return on 04/18/20 after being out with COVID.   Severance Name 04/24/20 1411 05/08/20 1148 05/09/20 0919 05/14/20 1548 05/17/20 1616   ITP Comments Called to check on patient. Left message on Terra's cell.  Was supposed to return last week. Zackarey has not been to rehab since his set return date on 04/18/20 after having COVID - unable to get goals this round 30 Day review completed. Medical Director ITP review done, changes made as directed, and signed approval by Medical Director. Out since 03/14/20.  Sent letter on 05/09/20 Pt did not return our contact attempts.  We will discharge him at this time.          Comments: Discharge ITP

## 2020-05-17 NOTE — Chronic Care Management (AMB) (Signed)
Care Management   Pharmacy Note  05/17/2020 Name: Chase Clark MRN: [id number] DOB: [birth year]  Subjective: Chase Clark is a 55 y.o. year old male who is a primary care patient of [name], [name], MD. The Care Management team was consulted for assistance with care management and care coordination needs.    Engaged with patient by telephone for follow up visit in response to provider referral for pharmacy case management and/or care coordination services. Call with patient and his wife, [name].   The patient was given information about Care Management services today including:  1. Care Management services includes personalized support from designated clinical staff supervised by the patient's primary care provider, including individualized plan of care and coordination with other care providers. 2. 24/7 contact phone numbers for assistance for urgent and routine care needs. 3. The patient may stop case management services at any time by phone call to the office staff.  Patient agreed to services and consent obtained.  Assessment:  Review of patient status, including review of consultants reports, laboratory and other test data, was performed as part of comprehensive evaluation and provision of chronic care management services.   SDOH (Social Determinants of Health) assessments and interventions performed:    Objective:  Lab Results  Component Value Date   CREATININE 4.13 (H) 03/15/2020   CREATININE 2.70 (H) 05/24/2019   CREATININE 2.83 (H) 03/09/2019    Lab Results  Component Value Date   HGBA1C 9.0 (A) 02/22/2020       Component Value Date/Time   CHOL 161 03/15/2020 0830   TRIG 372.0 (H) 03/15/2020 0830   HDL 34.10 (L) 03/15/2020 0830   CHOLHDL 5 03/15/2020 0830   VLDL 74.4 (H) 03/15/2020 0830   LDLCALC 55 05/24/2019 0905   LDLDIRECT 75.0 03/15/2020 0830     BP Readings from Last 3 Encounters:  03/23/20 (!) 171/109  03/15/20 (!) 170/90  02/22/20 140/82     Care Plan  Allergies  Allergen Reactions  . Ozempic (0.25 Or 0.5 Mg-Dose) [Semaglutide(0.25 Or 0.5mg -Dos)] Nausea And Vomiting and Other (See Comments)    Triggered occular migraines    Medications Reviewed Today    Reviewed by De Hollingshead, RPH-CPP (Pharmacist) on 05/17/20 at 1035  Med List Status: <None>  Medication Order Taking? Sig Documenting Provider Last Dose Status Informant  acetaminophen (TYLENOL) 325 MG tablet [id number] Yes Take 1 tablet (325 mg total) by mouth every 6 (six) hours as needed for mild pain (pain score 1-3 or temp > 100.5). Grier Mitts, PA-C Taking Active Spouse/Significant Other  amLODipine (NORVASC) 5 MG tablet [id number] Yes Take 2.5 mg by mouth daily. [provider] Taking Active   aspirin (ASPIRIN CHILDRENS) 81 MG chewable tablet [id number] Yes Chew 1 tablet (81 mg total) by mouth daily. Lajuana Matte, MD Taking Active   atorvastatin (LIPITOR) 80 MG tablet [id number] Yes TAKE 1 TABLET BY MOUTH EVERY DAY [name], MD Taking Active   brimonidine-timolol (COMBIGAN) 0.2-0.5 % ophthalmic solution [id number] Yes Place 1 drop into the right eye every 12 (twelve) hours.  [provider] Taking Active Spouse/Significant Other           Med Note Chanetta Marshall Feb 14, 2019  9:49 AM)    cholecalciferol (VITAMIN D3) 25 MCG (1000 UT) tablet [id number] Yes Take 1,000 Units by mouth daily. [provider] Taking Active   Continuous Blood Gluc Sensor (DEXCOM G6 SENSOR) MISC [id number] Yes Inject 1 Device into the skin  as directed. Place a new sensor every 10 days to check blood sugar at least 4 times daily Shamleffer, Melanie Crazier, MD Taking Active   Continuous Blood Gluc Transmit (DEXCOM G6 TRANSMITTER) MISC [id number] Yes Inject 1 Device into the skin as directed. Use to check blood sugar at least 4 times daily Shamleffer, Melanie Crazier, MD Taking Active   dorzolamide (TRUSOPT) 2 % ophthalmic solution  [id number] Yes 1 drop 2 (two) times daily. [provider] Taking Active   ezetimibe (ZETIA) 10 MG tablet [id number] Yes Take 1 tablet (10 mg total) by mouth daily. [name], MD Taking Active   fluticasone [facility]) 50 MCG/ACT nasal spray [id number] Yes SPRAY 2 SPRAYS INTO EACH NOSTRIL EVERY DAY [name], MD Taking Active   gabapentin (NEURONTIN) 300 MG capsule [id number] Yes Take 1 capsule (300 mg total) by mouth 2 (two) times daily. [name], MD Taking Active   hydrALAZINE (APRESOLINE) 25 MG tablet [id number] Yes Take 12.5 mg by mouth in the morning and at bedtime. [provider] Taking Active   Insulin Glargine [facility] KWIKPEN) 100 UNIT/ML [id number] Yes Inject 40 Units into the skin 2 (two) times daily. Further refills to come from endocrinology Shamleffer, Melanie Crazier, MD Taking Active   insulin lispro (HUMALOG KWIKPEN) 100 UNIT/ML KwikPen [id number] Yes Inject 6 Units into the skin 3 (three) times daily. Inject up to 15 units TID with meals. Further refills to come from endocrinology Shamleffer, Melanie Crazier, MD Taking Active            Med Note Nat Christen Apr 12, 2020  9:20 AM) 6-8 units with meals   Insulin Pen Needle 32G X 4 MM MISC [id number] Yes Inject 5 pens into the skin daily. Use to inject Lantus and Novolog up to 5 times daily Shamleffer, Melanie Crazier, MD Taking Active   Lactulose 20 GM/30ML SOLN [id number]  Take 30 mLs (20 g total) by mouth as needed.  Patient not taking: Reported on 04/12/2020   Lajuana Matte, MD  Active   latanoprost (XALATAN) 0.005 % ophthalmic solution [id number] Yes Place 1 drop into both eyes at bedtime. [provider] Taking Active Spouse/Significant Other           Med Note Chanetta Marshall Feb 14, 2019  9:49 AM)    lisinopril (ZESTRIL) 5 MG tablet [id number] Yes Take 1 tablet (5 mg total) by mouth daily. [name], MD Taking Active   metoprolol tartrate  (LOPRESSOR) 100 MG tablet [id number] Yes TAKE 0.5 TABLETS (50 MG TOTAL) BY MOUTH 2 (TWO) TIMES DAILY. [name], MD Taking Active   omeprazole (PRILOSEC) 20 MG capsule [id number] Yes Take 20 mg by mouth daily. [provider] Taking Active   senna (SENOKOT) 8.6 MG tablet [id number] Yes Take 1 tablet by mouth daily.  [provider] Taking Active   Sodium Sulfate-Mag Sulfate-KCl ([place]) [phone] MG TABS [id number]  Use as directed for colonoscopy. MANUFACTURER CODES!! BIN: [id number] PCN: CN GROUP: [id number] MEMBER ID: [id number];CLE AS SECONDARY INSURANCE ;NO PRIOR AUTHORIZATION Pyrtle, Lajuan Lines, MD  Active   torsemide (DEMADEX) 20 MG tablet [id number] Yes Take 20 mg by mouth. [provider] Taking Active           Patient Active Problem List   Diagnosis Date Noted  . Bloating 03/02/2020  . 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 Type 1 diabetes mellitus with stage  4 chronic kidney disease ([place]) 06/30/2019  . Type 1 diabetes mellitus with diabetic polyneuropathy ([place]) 06/30/2019  . Diabetes mellitus type I ([place]) 06/30/2019  . Left below-knee amputee ([place]) 06/30/2019  . Dyslipidemia 06/30/2019  . Swelling of limb 06/07/2019  . Vision changes 02/22/2019  . 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 Nephrotic syndrome 01/19/2019  . Allergic rhinitis 07/19/2018  . Amputation of left lower extremity below knee ([place]) 09/09/2017  . Benign prostatic hyperplasia   . Hyperlipidemia   . Chronic kidney disease (CKD), stage IV (severe) ([place])   . Neuropathic pain   . Coronary artery disease involving native coronary artery of native heart without angina pectoris   . Anemia of chronic disease   . Cervical spondylosis with myelopathy and radiculopathy 05/07/2017  . Fatty liver 04/04/2017  . DDD (degenerative disc disease), cervical 01/13/2017  . PAD (peripheral artery disease) ([place]) 08/07/2016  . Risk for falls 07/28/2016  . Diabetic  Charcot foot ([place]) 07/21/2016  . Nondisplaced fracture of left calcaneus 07/10/2016  . Wound infection 07/10/2016  . Acute osteomyelitis of right foot ([place]) 07/03/2016  . Diabetic peripheral neuropathy ([place]) 07/03/2016  . Chronic diarrhea 05/17/2016  . Peripheral vascular disease of extremity with claudication ([place]) 09/12/2015  . Anemia 01/12/2014  . Disorder of ligament of ankle 07/31/2013  . Edema 06/28/2013  . 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 Obesity (BMI 30-39.9) 06/12/2013  . Pain in joint, shoulder region 06/12/2013  . Statin intolerance 06/10/2013  . GILBERT'S SYNDROME 07/17/2006  . Essential hypertension 07/17/2006  . Coronary atherosclerosis 07/17/2006  . OSTEOARTHRITIS 07/17/2006    Conditions to be addressed/monitored: HTN, HLD, DMII and CKD  Care Plan : Medication Management  Updates made by De Hollingshead, RPH-CPP since 05/17/2020 12:00 AM    Problem: Diabetes, CAD (hx CABG, HTN), CKD     Long-Range Goal: Disease Progression Prevention   Recent Progress: On track  Priority: High  Note:   Current Barriers:  . Unable to achieve control of diabetes, hypertension   Pharmacist Clinical Goal(s):  Marland Kitchen Over the next 90 days, patient will achieve adherence to monitoring guidelines and medication adherence to achieve therapeutic efficacy through collaboration with PharmD and provider.    Interventions: . 1:1 collaboration with [name], MD regarding development and update of comprehensive plan of care as evidenced by provider attestation and co-signature . Inter-disciplinary care team collaboration (see longitudinal plan of care) . Comprehensive medication review performed; medication list updated in electronic medical record  Diabetes, treated as T1: . Uncontrolled; current treatment: Lantus 40 units BID, Novolog per sliding scale, 6-10 units with meals o Hx significant GI upset, occular migraines w/ Ozempic o Renal fx limits metformin,  SGLT2 . Current glucose readings: utilizing dexcom CGM. Reports readings recently 150-180, occasional overnight highs of 250s, but seems to always be related to higher-carbohydrate supper choices (pasta, rice). Only having lows if he forgets to eat.  [facility] ophthalmologist monthly for diabetic retinopathy. Reviewed to ask to send yearly eye exams to our office for uploading.  Marland Kitchen Appointment w/ nephrology Dr. Candiss Norse later this month.  . Peripheral neuropathy: gabapentin 300 mg BID . Encouraged continued collaboration and communication with endocrinology regarding glucose readings and concerns.   Hypertension with CKD: . Uncontrolled; current treatment: lisinopril 5 mg daily, hydralazine 12.5 mg BID, metoprolol tartrate 50 mg BID, amlodipine 2.5 mg daily, torsemide 20 mg daily added by nephrology to combat LEE. Patient notes some recurrent LEE since restarted low dose  amlodipine.  . Current home readings: has not been checking in several weeks.  . Advised to restart home BP monitoring periodically to provide ambulatory readings to nephrology team at upcoming appointment. Encouraged to discuss self-restart of amlodipine and to have conversations regarding adjustment of alternative medications to allow for discontinuation of amlodipine, if they believe low dose is continuing to contribute to edema.  . Encouraged restart of regular physical activity. Patient canceled cardiac rehab. Notes that was when BP readings became more elevated and amlodipine needed to be restarted.   . Renal function insufficient for CKD treatment, such as SGLT2 or finerenone.   Hyperlipidemia: . Uncontrolled but anticipated to be improved; current treatment: atorvastatin 80 mg daily, ezetimibe 10 mg daily recently added  . Recommend to continue current regimen at this time. Repeat lipid panel expected with next PCP visit  Allergies: . Controlled; current using fluticasone nasal spray PRN . Recommend to continue current regimen  at this time.   GI (GERD hx Barrett's, gastroparesis, family hx NASH): . Appropriately managed per current regimen: omeprazole 20 mg daily for GERD; senna daily for constipation . Colonoscopy upcoming per Dr. Hilarie Fredrickson . Recommend to continue current regimen with dietary modifications at this time.    Patient Goals/Self-Care Activities . Over the next 90 days, patient will:  - take medications as prescribed check glucose TID using CGM, document, and provide at future appointments check blood pressure daily, document, and provide at future appointments Communicate with specialty providers  Follow Up Plan: Telephone follow up appointment with care management team member scheduled for: ~ 10 weeks     Medication Assistance:  None required.  Patient affirms current coverage meets needs.  Follow Up:  Patient agrees to Care Plan and Follow-up.  Plan: Telephone follow up appointment with care management team member scheduled for:  ~ 10 weeks  Catie Darnelle Maffucci, PharmD, [place], [place] Clinical Pharmacist [employer] at [employer] [facility] [employer] [phone]

## 2020-05-21 ENCOUNTER — Ambulatory Visit (AMBULATORY_SURGERY_CENTER): Payer: Managed Care, Other (non HMO) | Admitting: Internal Medicine

## 2020-05-21 ENCOUNTER — Encounter: Payer: Managed Care, Other (non HMO) | Admitting: Internal Medicine

## 2020-05-21 ENCOUNTER — Other Ambulatory Visit: Payer: Self-pay

## 2020-05-21 ENCOUNTER — Encounter: Payer: Self-pay | Admitting: Internal Medicine

## 2020-05-21 VITALS — BP 151/80 | HR 60 | Temp 97.5°F | Resp 22 | Ht 70.0 in | Wt 242.0 lb

## 2020-05-21 DIAGNOSIS — Z1211 Encounter for screening for malignant neoplasm of colon: Secondary | ICD-10-CM

## 2020-05-21 DIAGNOSIS — K319 Disease of stomach and duodenum, unspecified: Secondary | ICD-10-CM | POA: Diagnosis not present

## 2020-05-21 DIAGNOSIS — D124 Benign neoplasm of descending colon: Secondary | ICD-10-CM | POA: Diagnosis not present

## 2020-05-21 DIAGNOSIS — D125 Benign neoplasm of sigmoid colon: Secondary | ICD-10-CM | POA: Diagnosis not present

## 2020-05-21 DIAGNOSIS — D123 Benign neoplasm of transverse colon: Secondary | ICD-10-CM | POA: Diagnosis not present

## 2020-05-21 DIAGNOSIS — D120 Benign neoplasm of cecum: Secondary | ICD-10-CM

## 2020-05-21 DIAGNOSIS — K317 Polyp of stomach and duodenum: Secondary | ICD-10-CM

## 2020-05-21 DIAGNOSIS — K219 Gastro-esophageal reflux disease without esophagitis: Secondary | ICD-10-CM | POA: Diagnosis present

## 2020-05-21 DIAGNOSIS — D122 Benign neoplasm of ascending colon: Secondary | ICD-10-CM

## 2020-05-21 DIAGNOSIS — Z8719 Personal history of other diseases of the digestive system: Secondary | ICD-10-CM

## 2020-05-21 MED ORDER — SODIUM CHLORIDE 0.9 % IV SOLN: 500.0000 mL | Freq: Once | INTRAVENOUS | Status: DC

## 2020-05-21 NOTE — Patient Instructions (Signed)
YOU HAD AN ENDOSCOPIC PROCEDURE TODAY AT THE [facility]:   Refer to the procedure report that was given to you for any specific questions about what was found during the examination.  If the procedure report does not answer your questions, please call your gastroenterologist to clarify.  If you requested that your care partner not be given the details of your procedure findings, then the procedure report has been included in a sealed envelope for you to review at your convenience later.  YOU SHOULD EXPECT: Some feelings of bloating in the abdomen. Passage of more gas than usual.  Walking can help get rid of the air that was put into your GI tract during the procedure and reduce the bloating. If you had a lower endoscopy (such as a colonoscopy or flexible sigmoidoscopy) you may notice spotting of blood in your stool or on the toilet paper. If you underwent a bowel prep for your procedure, you may not have a normal bowel movement for a few days.  Please Note:  You might notice some irritation and congestion in your nose or some drainage.  This is from the oxygen used during your procedure.  There is no need for concern and it should clear up in a day or so.  SYMPTOMS TO REPORT IMMEDIATELY:   Following lower endoscopy (colonoscopy or flexible sigmoidoscopy):  Excessive amounts of blood in the stool  Significant tenderness or worsening of abdominal pains  Swelling of the abdomen that is new, acute  Fever of 100F or higher   Following upper endoscopy (EGD)  Vomiting of blood or coffee ground material  New chest pain or pain under the shoulder blades  Painful or persistently difficult swallowing  New shortness of breath  Fever of 100F or higher  Black, tarry-looking stools  For urgent or emergent issues, a gastroenterologist can be reached at any hour by calling [phone]. Do not use MyChart messaging for urgent concerns.    DIET:  We do recommend a small meal at first, but  then you may proceed to your regular diet.  Drink plenty of fluids but you should avoid alcoholic beverages for 24 hours.  ACTIVITY:  You should plan to take it easy for the rest of today and you should NOT DRIVE or use heavy machinery until tomorrow (because of the sedation medicines used during the test).    FOLLOW UP: Our staff will call the number listed on your records 48-72 hours following your procedure to check on you and address any questions or concerns that you may have regarding the information given to you following your procedure. If we do not reach you, we will leave a message.  We will attempt to reach you two times.  During this call, we will ask if you have developed any symptoms of COVID 19. If you develop any symptoms (ie: fever, flu-like symptoms, shortness of breath, cough etc.) before then, please call [phone].  If you test positive for Covid 19 in the 2 weeks post procedure, please call and report this information to us.    If any biopsies were taken you will be contacted by phone or by letter within the next 1-3 weeks.  Please call us at [phone] if you have not heard about the biopsies in 3 weeks.    SIGNATURES/CONFIDENTIALITY: You and/or your care partner have signed paperwork which will be entered into your electronic medical record.  These signatures attest to the fact that that the information above on   your After Visit Summary has been reviewed and is understood.  Full responsibility of the confidentiality of this discharge information lies with you and/or your care-partner. 

## 2020-05-21 NOTE — Progress Notes (Signed)
A/ox3, pleased with MAC, report to RN 

## 2020-05-21 NOTE — Op Note (Signed)
[facility] Patient Name: Chase Clark Procedure Date: 05/21/2020 7:35 AM MRN: [id number] Endoscopist: Jerene Bears , MD Age: 55 Referring MD:  Date of Birth: Jul 06, 1965 Gender: Male Account #: [id number] Procedure:                Upper GI endoscopy Indications:              Gastro-esophageal reflux disease, Follow-up of                            Barrett's esophagus, abdominal bloating,                            intermittent nausea and early satiety Medicines:                Monitored Anesthesia Care Procedure:                Pre-Anesthesia Assessment:                           - Prior to the procedure, a History and Physical                            was performed, and patient medications and                            allergies were reviewed. The patient's tolerance of                            previous anesthesia was also reviewed. The risks                            and benefits of the procedure and the sedation                            options and risks were discussed with the patient.                            All questions were answered, and informed consent                            was obtained. Prior Anticoagulants: The patient has                            taken no previous anticoagulant or antiplatelet                            agents. ASA Grade Assessment: III - A patient with                            severe systemic disease. After reviewing the risks                            and benefits, the patient was deemed in  satisfactory condition to undergo the procedure.                           After obtaining informed consent, the endoscope was                            passed under direct vision. Throughout the                            procedure, the patient's blood pressure, pulse, and                            oxygen saturations were monitored continuously. The                            Endoscope was introduced  through the mouth, and                            advanced to the second part of duodenum. The upper                            GI endoscopy was accomplished without difficulty.                            The patient tolerated the procedure well. Scope In: Scope Out: Findings:                 The examined esophagus was normal.                           A single 9 mm sessile polyp was found in the                            gastric antrum. Biopsies were taken with a cold                            forceps for histology.                           The exam of the stomach was otherwise normal.                           Biopsies were taken with a cold forceps in the                            gastric body, at the incisura and in the gastric                            antrum for histology and Helicobacter pylori                            testing.                           The examined duodenum was normal.  Complications:            No immediate complications. Estimated Blood Loss:     Estimated blood loss was minimal. Impression:               - Normal esophagus.                           - A single gastric polyp. Biopsied.                           - Normal examined duodenum.                           - Biopsies were taken with a cold forceps for                            histology and Helicobacter pylori testing. Recommendation:           - Patient has a contact number available for                            emergencies. The signs and symptoms of potential                            delayed complications were discussed with the                            patient. Return to normal activities tomorrow.                            Written discharge instructions were provided to the                            patient.                           - Resume previous diet.                           - Continue present medications.                           - Await pathology results.                            - See the other procedure note for documentation of                            additional recommendations. Jerene Bears, MD 05/21/2020 9:15:45 AM This report has been signed electronically.

## 2020-05-21 NOTE — Op Note (Signed)
[facility] Patient Name: Chase Clark Procedure Date: 05/21/2020 7:34 AM MRN: [id number] Endoscopist: Jerene Bears , MD Age: 55 Referring MD:  Date of Birth: October 13, 1965 Gender: Male Account #: [id number] Procedure:                Colonoscopy Indications:              Screening for colorectal malignant neoplasm, last                            colonoscopy 8 years ago with less than good                            preparation Medicines:                Monitored Anesthesia Care Procedure:                Pre-Anesthesia Assessment:                           - Prior to the procedure, a History and Physical                            was performed, and patient medications and                            allergies were reviewed. The patient's tolerance of                            previous anesthesia was also reviewed. The risks                            and benefits of the procedure and the sedation                            options and risks were discussed with the patient.                            All questions were answered, and informed consent                            was obtained. Prior Anticoagulants: The patient has                            taken no previous anticoagulant or antiplatelet                            agents. ASA Grade Assessment: III - A patient with                            severe systemic disease. After reviewing the risks                            and benefits, the patient was deemed in  satisfactory condition to undergo the procedure.                           After obtaining informed consent, the colonoscope                            was passed under direct vision. Throughout the                            procedure, the patient's blood pressure, pulse, and                            oxygen saturations were monitored continuously. The                            Colonoscope was introduced through the anus and                             advanced to the cecum, identified by appendiceal                            orifice and ileocecal valve. The colonoscopy was                            performed without difficulty. The patient tolerated                            the procedure well. The quality of the bowel                            preparation was good with 2 day prep. The ileocecal                            valve, appendiceal orifice, and rectum were                            photographed. Scope In: 8:21:58 AM Scope Out: 9:09:03 AM Scope Withdrawal Time: 0 hours 44 minutes 48 seconds  Total Procedure Duration: 0 hours 47 minutes 5 seconds  Findings:                 The digital rectal exam was normal.                           A 20 mm polyp was found in the cecum. The polyp was                            sessile. Preparations were made for mucosal                            resection. Saline was injected to raise the lesion.                            Snare mucosal resection was performed. Resection  and retrieval were complete.                           Three sessile polyps were found in the cecum (one                            on the back side of the ICV in the cecum). The                            polyps were 2 to 9 mm in size. These polyps were                            removed with a cold snare. Resection and retrieval                            were complete.                           A 7 mm polyp was found in the ileocecal valve. The                            polyp was sessile. The polyp was removed with a                            cold snare. Resection and retrieval were complete.                           Two sessile polyps were found in the ascending                            colon. The polyps were 7 to 8 mm in size. These                            polyps were removed with a cold snare. Resection                            and retrieval were complete.                            Eight sessile polyps were found in the transverse                            colon. The polyps were 3 to 10 mm in size. These                            polyps were removed with a cold snare. Resection                            and retrieval were complete.                           Four sessile polyps were found in the descending  colon. The polyps were 5 to 8 mm in size. These                            polyps were removed with a cold snare. Resection                            and retrieval were complete.                           Three sessile polyps were found in the sigmoid                            colon. The polyps were 3 to 6 mm in size. These                            polyps were removed with a cold snare. Resection                            and retrieval were complete.                           Internal hemorrhoids and hypertrophied anal                            papillae were found during retroflexion. The                            hemorrhoids were small. Complications:            No immediate complications. Estimated Blood Loss:     Estimated blood loss was minimal. Impression:               - One 20 mm polyp in the cecum, removed with                            mucosal resection. Resected and retrieved. EMR.                           - Three 2 to 9 mm polyps in the cecum, removed with                            a cold snare. Resected and retrieved.                           - One 7 mm polyp at the ileocecal valve, removed                            with a cold snare. Resected and retrieved.                           - Two 7 to 8 mm polyps in the ascending colon,                            removed  with a cold snare. Resected and retrieved.                           - Eight 3 to 10 mm polyps in the transverse colon,                            removed with a cold snare. Resected and retrieved.                           - Four  5 to 8 mm polyps in the descending colon,                            removed with a cold snare. Resected and retrieved.                           - Three 3 to 6 mm polyps in the sigmoid colon,                            removed with a cold snare. Resected and retrieved.                           - Internal hemorrhoids.                           - Mucosal resection was performed. Resection and                            retrieval were complete. Recommendation:           - Patient has a contact number available for                            emergencies. The signs and symptoms of potential                            delayed complications were discussed with the                            patient. Return to normal activities tomorrow.                            Written discharge instructions were provided to the                            patient.                           - Resume previous diet.                           - Continue present medications.                           - Await pathology results.                           -  Repeat colonoscopy is recommended for                            surveillance after piecemeal polypectomy. The                            colonoscopy date will be determined after pathology                            results from today's exam become available for                            review. Jerene Bears, MD 05/21/2020 9:23:08 AM This report has been signed electronically.

## 2020-05-21 NOTE — Progress Notes (Signed)
VS by CW. ?

## 2020-05-21 NOTE — Progress Notes (Signed)
Called to room to assist during endoscopic procedure.  Patient ID and intended procedure confirmed with present staff. Received instructions for my participation in the procedure from the performing physician.  

## 2020-05-23 ENCOUNTER — Telehealth: Payer: Self-pay

## 2020-05-23 NOTE — Telephone Encounter (Signed)
  Follow up Call-  Call back number 05/21/2020  Post procedure Call Back phone  # [phone]  Some recent data might be hidden     Patient questions:  Do you have a fever, pain , or abdominal swelling? No. Pain Score  0 *  Have you tolerated food without any problems? Yes.    Have you been able to return to your normal activities? Yes.    Do you have any questions about your discharge instructions: Diet   No. Medications  No. Follow up visit  No.  Do you have questions or concerns about your Care? No.  Actions: * If pain score is 4 or above: No action needed, pain <4.  1. Have you developed a fever since your procedure? no  2.   Have you had an respiratory symptoms (SOB or cough) since your procedure? no  3.   Have you tested positive for COVID 19 since your procedure no  4.   Have you had any family members/close contacts diagnosed with the COVID 19 since your procedure?  no   If yes to any of these questions please route to Joylene John, RN and Joella Prince, RN

## 2020-05-28 NOTE — Telephone Encounter (Signed)
Yes okay for prescription for metoclopramide 10 mg nightly he should follow-up as he is intending in about 2 months

## 2020-05-29 MED ORDER — METOCLOPRAMIDE HCL 10 MG PO TABS: 10.0000 mg | ORAL_TABLET | Freq: Every evening | ORAL | 1 refills | Status: DC

## 2020-05-30 ENCOUNTER — Other Ambulatory Visit: Payer: Self-pay | Admitting: Nurse Practitioner

## 2020-05-31 ENCOUNTER — Encounter: Payer: Self-pay | Admitting: Internal Medicine

## 2020-06-01 ENCOUNTER — Other Ambulatory Visit: Payer: Self-pay

## 2020-06-05 ENCOUNTER — Telehealth: Payer: Self-pay | Admitting: *Deleted

## 2020-06-05 ENCOUNTER — Other Ambulatory Visit: Payer: Self-pay

## 2020-06-05 ENCOUNTER — Ambulatory Visit (INDEPENDENT_AMBULATORY_CARE_PROVIDER_SITE_OTHER): Payer: Managed Care, Other (non HMO) | Admitting: Family Medicine

## 2020-06-05 ENCOUNTER — Encounter: Payer: Self-pay | Admitting: Family Medicine

## 2020-06-05 VITALS — BP 140/80 | HR 68 | Temp 98.5°F | Ht 70.0 in | Wt 255.6 lb

## 2020-06-05 DIAGNOSIS — N184 Chronic kidney disease, stage 4 (severe): Secondary | ICD-10-CM

## 2020-06-05 DIAGNOSIS — E103593 Type 1 diabetes mellitus with proliferative diabetic retinopathy without macular edema, bilateral: Secondary | ICD-10-CM | POA: Diagnosis not present

## 2020-06-05 DIAGNOSIS — R519 Headache, unspecified: Secondary | ICD-10-CM

## 2020-06-05 DIAGNOSIS — E785 Hyperlipidemia, unspecified: Secondary | ICD-10-CM

## 2020-06-05 DIAGNOSIS — K635 Polyp of colon: Secondary | ICD-10-CM

## 2020-06-05 DIAGNOSIS — I739 Peripheral vascular disease, unspecified: Secondary | ICD-10-CM | POA: Diagnosis not present

## 2020-06-05 DIAGNOSIS — D638 Anemia in other chronic diseases classified elsewhere: Secondary | ICD-10-CM

## 2020-06-05 DIAGNOSIS — I10 Essential (primary) hypertension: Secondary | ICD-10-CM | POA: Diagnosis not present

## 2020-06-05 DIAGNOSIS — S81801A Unspecified open wound, right lower leg, initial encounter: Secondary | ICD-10-CM

## 2020-06-05 DIAGNOSIS — G8929 Other chronic pain: Secondary | ICD-10-CM

## 2020-06-05 LAB — RENAL FUNCTION PANEL
Albumin: 3 g/dL — ABNORMAL LOW (ref 3.5–5.2)
BUN: 43 mg/dL — ABNORMAL HIGH (ref 6–23)
CO2: 23 mEq/L (ref 19–32)
Calcium: 8.6 mg/dL (ref 8.4–10.5)
Chloride: 107 mEq/L (ref 96–112)
Creatinine, Ser: 4.62 mg/dL (ref 0.40–1.50)
GFR: 13.38 mL/min — CL (ref >60.00)
Glucose, Bld: 110 mg/dL — ABNORMAL HIGH (ref 70–99)
Phosphorus: 4.9 mg/dL — ABNORMAL HIGH (ref 2.3–4.6)
Potassium: 4.2 mEq/L (ref 3.5–5.1)
Sodium: 139 mEq/L (ref 135–145)

## 2020-06-05 LAB — HEPATIC FUNCTION PANEL
ALT: 29 U/L (ref 0–53)
AST: 36 U/L (ref 0–37)
Albumin: 3 g/dL — ABNORMAL LOW (ref 3.5–5.2)
Alkaline Phosphatase: 90 U/L (ref 39–117)
Bilirubin, Direct: 0.1 mg/dL (ref 0.0–0.3)
Total Bilirubin: 0.6 mg/dL (ref 0.2–1.2)
Total Protein: 5.4 g/dL — ABNORMAL LOW (ref 6.0–8.3)

## 2020-06-05 LAB — HEMOGLOBIN A1C: Hgb A1c MFr Bld: 7 % — ABNORMAL HIGH (ref 4.6–6.5)

## 2020-06-05 LAB — LIPID PANEL
Cholesterol: 119 mg/dL (ref 0–200)
HDL: 33.8 mg/dL — ABNORMAL LOW (ref >39.00)
NonHDL: 85.56
Total CHOL/HDL Ratio: 4
Triglycerides: 274 mg/dL — ABNORMAL HIGH (ref 0.0–149.0)
VLDL: 54.8 mg/dL — ABNORMAL HIGH (ref 0.0–40.0)

## 2020-06-05 LAB — CBC
HCT: 33.1 % — ABNORMAL LOW (ref 39.0–52.0)
Hemoglobin: 11.5 g/dL — ABNORMAL LOW (ref 13.0–17.0)
MCHC: 34.8 g/dL (ref 30.0–36.0)
MCV: 90 fl (ref 78.0–100.0)
Platelets: 196 10*3/uL (ref 150.0–400.0)
RBC: 3.68 Mil/uL — ABNORMAL LOW (ref 4.22–5.81)
RDW: 13.7 % (ref 11.5–15.5)
WBC: 9.6 10*3/uL (ref 4.0–10.5)

## 2020-06-05 LAB — VITAMIN D 25 HYDROXY (VIT D DEFICIENCY, FRACTURES): VITD: 10.67 ng/mL — ABNORMAL LOW (ref 30.00–100.00)

## 2020-06-05 LAB — LDL CHOLESTEROL, DIRECT: Direct LDL: 43 mg/dL

## 2020-06-05 NOTE — Telephone Encounter (Signed)
Reviewed. Not significantly different than his baseline. He will see nephrology later this week.

## 2020-06-05 NOTE — Progress Notes (Signed)
Chase Rumps, Chase Clark Phone: [phone]  PER BEAGLEY is a 55 y.o. male who presents today for follow-up.  Diabetes: Ranging 120-140 typically.  On 40 units of Basaglar twice daily with Humalog 6 to 10 units with meals.  No polyuria or polydipsia.  Occasional hypoglycemia where he feels tired.  Orange juice helps.  Hypertension: Typically 150/90 at home.  Taking lisinopril 5 mg daily, hydralazine 12.5 mg twice daily, metoprolol 50 mg twice daily, and amlodipine 2.5 mg daily, and torsemide 20 mg daily.  No chest pain or shortness of breath.  Chronic intermittent edema in his right lower extremity.  Right lower leg wound: This is on the anterior portion of his lower leg on the right side.  He has repeatedly hit this area on furniture over the years and notes the skin is a little thinner.  He notes the area will weep clear fluid when his leg swells.  He notes there is scarring in his right lower shin.  No signs of infection.  No fevers, redness, or purulent discharge.  He does have venous insufficiency and has compression stockings at home though notes they are difficult to get on with the wound.  Hyperlipidemia: Taking Lipitor and Zetia.  Due for recheck.  Colon polyps had numerous polyps found recently.  There is the plan in place to recheck a colonoscopy in 6 months.  One of the polyps was 2 cm.  Chronic headaches: Patient notes ongoing issues with headaches related to his vision issues.  He notes it feels like migraines at times.  He wonders about checking his vitamin D levels.  Social History   Tobacco Use  Smoking Status Former Smoker  . Types: Cigarettes  . Quit date: 03/14/2008  . Years since quitting: 12.2  Smokeless Tobacco Never Used    Current Outpatient Medications on File Prior to Visit  Medication Sig Dispense Refill  . acetaminophen (TYLENOL) 325 MG tablet Take 1 tablet (325 mg total) by mouth every 6 (six) hours as needed for mild pain (pain score 1-3 or temp > 100.5).  30 tablet 0  . amLODipine (NORVASC) 5 MG tablet Take 2.5 mg by mouth daily.    Marland Kitchen aspirin (ASPIRIN CHILDRENS) 81 MG chewable tablet Chew 1 tablet (81 mg total) by mouth daily. 30 tablet 1  . atorvastatin (LIPITOR) 80 MG tablet TAKE 1 TABLET BY MOUTH EVERY DAY 90 tablet 1  . brimonidine-timolol (COMBIGAN) 0.2-0.5 % ophthalmic solution Place 1 drop into the right eye every 12 (twelve) hours.     . cholecalciferol (VITAMIN D3) 25 MCG (1000 UT) tablet Take 1,000 Units by mouth daily.    . Continuous Blood Gluc Sensor (DEXCOM G6 SENSOR) MISC Inject 1 Device into the skin as directed. Place a new sensor every 10 days to check blood sugar at least 4 times daily 9 each 3  . Continuous Blood Gluc Transmit (DEXCOM G6 TRANSMITTER) MISC Inject 1 Device into the skin as directed. Use to check blood sugar at least 4 times daily 1 each 3  . dorzolamide (TRUSOPT) 2 % ophthalmic solution 1 drop 2 (two) times daily.    Marland Kitchen ezetimibe (ZETIA) 10 MG tablet Take 1 tablet (10 mg total) by mouth daily. 90 tablet 3  . fluticasone (FLONASE) 50 MCG/ACT nasal spray SPRAY 2 SPRAYS INTO EACH NOSTRIL EVERY DAY 48 mL 2  . gabapentin (NEURONTIN) 300 MG capsule Take 1 capsule (300 mg total) by mouth 2 (two) times daily. 60 capsule 6  . hydrALAZINE (APRESOLINE) 25  MG tablet Take 12.5 mg by mouth in the morning and at bedtime.    . Insulin Glargine (BASAGLAR KWIKPEN) 100 UNIT/ML Inject 40 Units into the skin 2 (two) times daily. Further refills to come from endocrinology 75 mL 3  . insulin lispro (HUMALOG KWIKPEN) 100 UNIT/ML KwikPen Inject 6 Units into the skin 3 (three) times daily. Inject up to 15 units TID with meals. Further refills to come from endocrinology 30 mL 2  . Insulin Pen Needle 32G X 4 MM MISC Inject 5 pens into the skin daily. Use to inject Lantus and Novolog up to 5 times daily 500 each 1  . Lactulose 20 GM/30ML SOLN Take 30 mLs (20 g total) by mouth as needed. 450 mL 1  . latanoprost (XALATAN) 0.005 % ophthalmic  solution Place 1 drop into both eyes at bedtime.  5  . lisinopril (ZESTRIL) 5 MG tablet Take 1 tablet (5 mg total) by mouth daily. 90 tablet 1  . metoCLOPramide (REGLAN) 10 MG tablet Take 1 tablet (10 mg total) by mouth at bedtime. 30 tablet 1  . metoprolol tartrate (LOPRESSOR) 100 MG tablet TAKE 0.5 TABLETS (50 MG TOTAL) BY MOUTH 2 (TWO) TIMES DAILY. 90 tablet 1  . Multiple Vitamin (MULTIVITAMIN) tablet Take 1 tablet by mouth daily.    Marland Kitchen omeprazole (PRILOSEC) 20 MG capsule Take 20 mg by mouth daily.    Marland Kitchen senna (SENOKOT) 8.6 MG tablet Take 1 tablet by mouth daily.     Marland Kitchen torsemide (DEMADEX) 20 MG tablet Take 20 mg by mouth.     No current facility-administered medications on file prior to visit.     ROS see history of present illness  Objective  Physical Exam Vitals:   06/05/20 0818  BP: 140/80  Pulse: 68  Temp: 98.5 F (36.9 C)  SpO2: 99%    BP Readings from Last 3 Encounters:  06/05/20 140/80  05/21/20 (!) 151/80  03/23/20 (!) 171/109   Wt Readings from Last 3 Encounters:  06/05/20 255 lb 9.6 oz (115.9 kg)  05/21/20 242 lb (109.8 kg)  03/15/20 242 lb (109.8 kg)    Physical Exam Constitutional:      General: He is not in acute distress.    Appearance: He is not diaphoretic.  Cardiovascular:     Rate and Rhythm: Normal rate and regular rhythm.     Heart sounds: Normal heart sounds.  Pulmonary:     Effort: Pulmonary effort is normal.     Breath sounds: Normal breath sounds.  Musculoskeletal:        General: No edema.  Skin:    General: Skin is warm and dry.     Comments: Right lower extremity with 1+ pitting edema, there is scarring anteriorly over the midportion of his shin, there is a small denuded area centrally within the scarring, no surrounding erythema, no warmth, no purulent drainage  Neurological:     Mental Status: He is alert.      Assessment/Plan: Please see individual problem list.  Problem List Items Addressed This Visit    Anemia of chronic  disease   Relevant Orders   CBC (Completed)   Chronic headaches    Chronic issues with this.  Possibly related to his vision issues.  He will continue to see his ophthalmologist.  We will check vitamin D levels.      Chronic kidney disease (CKD), stage IV (severe) ([place])    The patient has an upcoming appointment with his nephrologist later this week.  We will check lab work as outlined.      Relevant Orders   Renal Function Panel (Completed)   PTH, intact (no Ca) (Completed)   Vitamin D (25 hydroxy) (Completed)   Colon polyps    Patient with numerous polyps.  He will follow-up with GI as planned for colonoscopy in 6 months.      RESOLVED: Dyslipidemia   Essential hypertension - Primary    Remains elevated.  Patient on numerous medications.  We will communicate with his nephrologist to determine best management.  He will continue lisinopril, hydralazine, metoprolol, amlodipine, and torsemide.      Relevant Orders   Renal Function Panel (Completed)   Hyperlipidemia    Check lipids.  Continue Lipitor and Zetia.      Relevant Orders   Lipid panel (Completed)   Hepatic function panel (Completed)   Leg wound, right    Patient with an area of denuded skin in his right lower extremity.  There is no sign of infection.  I suspect his lower extremity swelling is contributing to this.  This is likely caused by venous insufficiency.  Discussed elevation of his leg is much as possible.  Discussed walking as a method of combating swelling as well.  Discussed that he could see the wound clinic as scheduled to get their input.      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  We will check an A1c.  He will continue Basaglar and Humalog as outlined.      Relevant Orders   HgB A1c (Completed)        This visit occurred during the SARS-CoV-2 public health emergency.  Safety protocols  were in place, including screening questions prior to the visit, additional usage of staff PPE, and extensive cleaning of exam room while observing appropriate contact time as indicated for disinfecting solutions.    Chase Rumps, Chase Clark [place]

## 2020-06-05 NOTE — Patient Instructions (Addendum)
Nice to see you. Please try to walk as much as you are able to.  Please contact your cardiologist to let them know you do not want to complete cardiac rehab. I will check with your nephrologist regarding your blood pressure. We will check lab work.

## 2020-06-05 NOTE — Telephone Encounter (Signed)
CRITICAL VALUE STICKER  CRITICAL VALUE:  Creatinine: 4.51    GFR: 13.77  RECEIVER (on-site recipient of call): Jari Favre, CMA/XT  DATE & TIME NOTIFIED: 06/05/20 @ 10:55am  MESSENGER (representative from lab): Carrington.Norris  MD NOTIFIED: [name]  TIME OF NOTIFICATION:10:57am  RESPONSE: see phone note or results

## 2020-06-06 LAB — PARATHYROID HORMONE, INTACT (NO CA): PTH: 137 pg/mL — ABNORMAL HIGH (ref 14–64)

## 2020-06-07 ENCOUNTER — Telehealth: Payer: Self-pay | Admitting: Family Medicine

## 2020-06-07 DIAGNOSIS — G8929 Other chronic pain: Secondary | ICD-10-CM | POA: Insufficient documentation

## 2020-06-07 DIAGNOSIS — R519 Headache, unspecified: Secondary | ICD-10-CM | POA: Insufficient documentation

## 2020-06-07 DIAGNOSIS — K635 Polyp of colon: Secondary | ICD-10-CM | POA: Insufficient documentation

## 2020-06-07 DIAGNOSIS — S81801A Unspecified open wound, right lower leg, initial encounter: Secondary | ICD-10-CM | POA: Insufficient documentation

## 2020-06-07 NOTE — Assessment & Plan Note (Signed)
Seems to have improved control.  We will check an A1c.  He will continue Basaglar and Humalog as outlined.

## 2020-06-07 NOTE — Assessment & Plan Note (Signed)
Patient with numerous polyps.  He will follow-up with GI as planned for colonoscopy in 6 months.

## 2020-06-07 NOTE — Assessment & Plan Note (Signed)
The patient has an upcoming appointment with his nephrologist later this week.  We will check lab work as outlined.

## 2020-06-07 NOTE — Assessment & Plan Note (Signed)
Patient with an area of denuded skin in his right lower extremity.  There is no sign of infection.  I suspect his lower extremity swelling is contributing to this.  This is likely caused by venous insufficiency.  Discussed elevation of his leg is much as possible.  Discussed walking as a method of combating swelling as well.  Discussed that he could see the wound clinic as scheduled to get their input.

## 2020-06-07 NOTE — Assessment & Plan Note (Addendum)
Check lipids.  Continue Lipitor and Zetia.

## 2020-06-07 NOTE — Assessment & Plan Note (Addendum)
Chronic issues with this.  Possibly related to his vision issues.  He will continue to see his ophthalmologist.  We will check vitamin D levels.

## 2020-06-07 NOTE — Telephone Encounter (Signed)
-----   Message from Murlean Iba, MD sent at 06/07/2020  9:56 AM EST ----- Regarding: RE: HTN Creatinine is quite high so may not abe able to increase lisinopril. He had large amount of edema, hydralazine may make it worse. Consider clonidine patch. If I remember correctly, he is erratic in taking his meds.start with 0.1 mg first and increase to 0.2 eventually ? [place]  ----- Message ----- From: [name], MD Sent: 06/07/2020   8:37 AM EST To: Murlean Iba, MD Subject: HTN                                            Hi Dr Candiss Norse,   I saw Mr Kersh earlier this week for follow-up. He is seeing you today for follow-up I believe. His BP remains elevated most of the time. I wanted to get your input on his BP medications. Would the next step be increasing his lisinopril (Unsure if this would be appropriate with his kidney function) or hydralazine or some other change? Thanks for your help.   Randall Hiss

## 2020-06-07 NOTE — Assessment & Plan Note (Signed)
Remains elevated.  Patient on numerous medications.  We will communicate with his nephrologist to determine best management.  He will continue lisinopril, hydralazine, metoprolol, amlodipine, and torsemide.

## 2020-06-08 ENCOUNTER — Telehealth: Payer: Self-pay | Admitting: Family Medicine

## 2020-06-08 ENCOUNTER — Other Ambulatory Visit: Payer: Self-pay

## 2020-06-08 ENCOUNTER — Ambulatory Visit (HOSPITAL_COMMUNITY)
Admission: RE | Admit: 2020-06-08 | Discharge: 2020-06-08 | Disposition: A | Discharge status: Home / Self Care | Payer: Managed Care, Other (non HMO) | Source: Ambulatory Visit | Attending: Internal Medicine | Admitting: Internal Medicine

## 2020-06-08 DIAGNOSIS — E785 Hyperlipidemia, unspecified: Secondary | ICD-10-CM | POA: Diagnosis not present

## 2020-06-08 DIAGNOSIS — E119 Type 2 diabetes mellitus without complications: Secondary | ICD-10-CM | POA: Insufficient documentation

## 2020-06-08 DIAGNOSIS — Z8379 Family history of other diseases of the digestive system: Secondary | ICD-10-CM | POA: Diagnosis present

## 2020-06-08 DIAGNOSIS — I10 Essential (primary) hypertension: Secondary | ICD-10-CM | POA: Diagnosis not present

## 2020-06-08 DIAGNOSIS — I999 Unspecified disorder of circulatory system: Secondary | ICD-10-CM | POA: Diagnosis not present

## 2020-06-08 DIAGNOSIS — E669 Obesity, unspecified: Secondary | ICD-10-CM | POA: Insufficient documentation

## 2020-06-08 NOTE — Telephone Encounter (Signed)
Current regimen is lisinopril 5 mg daily, hydralazine 12.5 mg twice daily, metoprolol 50 mg twice daily, and amlodipine 2.5 mg daily, and torsemide 20 mg daily.   Can't use thiazide or spironolactone due to renal function.   I agree with avoiding pushing hydralazine and amlodipine if edema is a concern.   I'm trying to understand why Dr. Ubaldo Glassing changed him from carvedilol to metoprolol in 03/2017. There was a mention of helping with pre-op anxiety, but I'm not sure. Can you look at that note and see your thoughts? If we could change back to carvedilol, that would probably be my first thought over addition of clonidine.   Catie

## 2020-06-08 NOTE — Telephone Encounter (Signed)
Please the message below from Dr Candiss Norse. The patients BP has been consistently above goal. He mentioned starting on a clonidine patch. Would there be any reason this would not be a great idea for the patient?

## 2020-06-08 NOTE — Telephone Encounter (Signed)
Patient was returning call for lab results 

## 2020-06-08 NOTE — Telephone Encounter (Signed)
I called and spoke with the patients wife and informed her of the patient's lab results and she understood.  Ugochi Henzler,cma

## 2020-06-14 NOTE — Telephone Encounter (Signed)
My only other thought is that it was changed from a pre-op perspective - metoprolol having a shorter half life, more easy to titrate on the inpatient side.   I would not be concerned about adding clonidine to carvedilol, if it is needed, besides typical concerns related to clonidine use.

## 2020-06-14 NOTE — Telephone Encounter (Signed)
I can not tell why that change was made. We could try changing him over to coreg first to see what kind of benefit he gets from that prior to adding clonidine. Would we be able to ad clonidine to the regimen with coreg on board given the alpha activity of both meds?

## 2020-06-18 ENCOUNTER — Other Ambulatory Visit: Payer: Self-pay | Admitting: Internal Medicine

## 2020-06-18 DIAGNOSIS — E103593 Type 1 diabetes mellitus with proliferative diabetic retinopathy without macular edema, bilateral: Secondary | ICD-10-CM

## 2020-06-18 MED ORDER — TRESIBA FLEXTOUCH 200 UNIT/ML ~~LOC~~ SOPN: 40.0000 [IU] | PEN_INJECTOR | Freq: Two times a day (BID) | SUBCUTANEOUS | 3 refills | Status: DC

## 2020-06-18 NOTE — Telephone Encounter (Signed)
Please advise 

## 2020-06-18 NOTE — Telephone Encounter (Signed)
Can you contact the patient and see what his BP has been running at home since our visit? I have a couple of options for his BP if it is still running high.

## 2020-06-19 NOTE — Telephone Encounter (Signed)
LVM for the pateint to call back.  Jaeleen Inzunza,cma

## 2020-06-22 NOTE — Telephone Encounter (Signed)
I called and spoke with the patients wife and asked how the patients BP been running and she stated it has been high.  The last reading was 140/90.  Dr. Candiss Norse took the patient off amlodipine for leg swelling and she feels he needs something for his BP. Yuka Lallier,cma

## 2020-06-23 NOTE — Telephone Encounter (Signed)
Noted.  Would he be willing to switch his metoprolol to carvedilol?  There may be better blood pressure benefit with the carvedilol.  If that is not enough to get his blood pressure down we could consider adding a clonidine patch after we make the switch to carvedilol.

## 2020-06-25 NOTE — Telephone Encounter (Signed)
I received a call back from the patients wife and they are okay with the switch from Metoprolol to carvedilol and you can send to pharmacy.  Angeldejesus Callaham,cma

## 2020-06-25 NOTE — Telephone Encounter (Signed)
LVM for the patient to call back and ask for Chase Clark.  Chase Clark,cma  

## 2020-06-27 ENCOUNTER — Other Ambulatory Visit: Payer: Self-pay | Admitting: Family Medicine

## 2020-06-27 ENCOUNTER — Other Ambulatory Visit: Payer: Self-pay | Admitting: Internal Medicine

## 2020-06-27 DIAGNOSIS — E103593 Type 1 diabetes mellitus with proliferative diabetic retinopathy without macular edema, bilateral: Secondary | ICD-10-CM

## 2020-06-27 NOTE — Telephone Encounter (Signed)
I'd switch to carvedilol 25 mg BID.

## 2020-06-27 NOTE — Telephone Encounter (Signed)
What would the equivalent dosing be for coreg compared to this patients metoprolol prescription?

## 2020-06-28 MED ORDER — CARVEDILOL 25 MG PO TABS: 25.0000 mg | ORAL_TABLET | Freq: Two times a day (BID) | ORAL | 3 refills | Status: DC

## 2020-06-28 NOTE — Telephone Encounter (Signed)
I called and spoke with the patients wife and informed her that the new medication was sent and to stop the metoprolol. Patient is to take the Coreg at the time he takes the metoprolol and she understood.  Nina,cma

## 2020-06-28 NOTE — Telephone Encounter (Signed)
I've sent coreg to the patients pharmacy. He can start this in place of his metoprolol to be taken when his next dose of metoprolol would be taken. He is to stop the metoprolol.

## 2020-06-28 NOTE — Addendum Note (Signed)
Addended by: [name], Emaad Nanna G on: 06/28/2020 02:10 PM   Modules accepted: Orders

## 2020-06-29 ENCOUNTER — Other Ambulatory Visit: Payer: Self-pay

## 2020-06-29 ENCOUNTER — Encounter (HOSPITAL_COMMUNITY): Payer: Self-pay | Admitting: Emergency Medicine

## 2020-06-29 ENCOUNTER — Emergency Department (HOSPITAL_COMMUNITY)
Admission: EM | Admit: 2020-06-29 | Discharge: 2020-06-30 | Disposition: A | Discharge status: Home / Self Care | Payer: Managed Care, Other (non HMO) | Attending: Emergency Medicine | Admitting: Emergency Medicine

## 2020-06-29 DIAGNOSIS — X58XXXA Exposure to other specified factors, initial encounter: Secondary | ICD-10-CM | POA: Diagnosis not present

## 2020-06-29 DIAGNOSIS — N186 End stage renal disease: Secondary | ICD-10-CM | POA: Diagnosis not present

## 2020-06-29 DIAGNOSIS — Z992 Dependence on renal dialysis: Secondary | ICD-10-CM | POA: Diagnosis not present

## 2020-06-29 DIAGNOSIS — S91211A Laceration without foreign body of right great toe with damage to nail, initial encounter: Secondary | ICD-10-CM | POA: Insufficient documentation

## 2020-06-29 DIAGNOSIS — R519 Headache, unspecified: Secondary | ICD-10-CM | POA: Insufficient documentation

## 2020-06-29 DIAGNOSIS — Z794 Long term (current) use of insulin: Secondary | ICD-10-CM | POA: Diagnosis not present

## 2020-06-29 DIAGNOSIS — Z87891 Personal history of nicotine dependence: Secondary | ICD-10-CM | POA: Insufficient documentation

## 2020-06-29 DIAGNOSIS — E1022 Type 1 diabetes mellitus with diabetic chronic kidney disease: Secondary | ICD-10-CM | POA: Insufficient documentation

## 2020-06-29 DIAGNOSIS — Z7951 Long term (current) use of inhaled steroids: Secondary | ICD-10-CM | POA: Diagnosis not present

## 2020-06-29 DIAGNOSIS — S99921A Unspecified injury of right foot, initial encounter: Secondary | ICD-10-CM

## 2020-06-29 DIAGNOSIS — R609 Edema, unspecified: Secondary | ICD-10-CM | POA: Insufficient documentation

## 2020-06-29 DIAGNOSIS — Z951 Presence of aortocoronary bypass graft: Secondary | ICD-10-CM | POA: Insufficient documentation

## 2020-06-29 DIAGNOSIS — J45909 Unspecified asthma, uncomplicated: Secondary | ICD-10-CM | POA: Diagnosis not present

## 2020-06-29 DIAGNOSIS — Z79899 Other long term (current) drug therapy: Secondary | ICD-10-CM | POA: Diagnosis not present

## 2020-06-29 DIAGNOSIS — I120 Hypertensive chronic kidney disease with stage 5 chronic kidney disease or end stage renal disease: Secondary | ICD-10-CM | POA: Insufficient documentation

## 2020-06-29 DIAGNOSIS — S90931A Unspecified superficial injury of right great toe, initial encounter: Secondary | ICD-10-CM | POA: Diagnosis present

## 2020-06-29 DIAGNOSIS — E1040 Type 1 diabetes mellitus with diabetic neuropathy, unspecified: Secondary | ICD-10-CM | POA: Insufficient documentation

## 2020-06-29 DIAGNOSIS — I2510 Atherosclerotic heart disease of native coronary artery without angina pectoris: Secondary | ICD-10-CM | POA: Diagnosis not present

## 2020-06-29 DIAGNOSIS — E1010 Type 1 diabetes mellitus with ketoacidosis without coma: Secondary | ICD-10-CM | POA: Diagnosis not present

## 2020-06-29 DIAGNOSIS — Z7982 Long term (current) use of aspirin: Secondary | ICD-10-CM | POA: Insufficient documentation

## 2020-06-29 LAB — CBC WITH DIFFERENTIAL/PLATELET
Abs Immature Granulocytes: 0.05 10*3/uL (ref 0.00–0.07)
Basophils Absolute: 0.1 10*3/uL (ref 0.0–0.1)
Basophils Relative: 1 %
Eosinophils Absolute: 0.5 10*3/uL (ref 0.0–0.5)
Eosinophils Relative: 5 %
HCT: 32.5 % — ABNORMAL LOW (ref 39.0–52.0)
Hemoglobin: 11.1 g/dL — ABNORMAL LOW (ref 13.0–17.0)
Immature Granulocytes: 1 %
Lymphocytes Relative: 13 %
Lymphs Abs: 1.3 10*3/uL (ref 0.7–4.0)
MCH: 31.4 pg (ref 26.0–34.0)
MCHC: 34.2 g/dL (ref 30.0–36.0)
MCV: 91.8 fL (ref 80.0–100.0)
Monocytes Absolute: 0.8 10*3/uL (ref 0.1–1.0)
Monocytes Relative: 8 %
Neutro Abs: 7.3 10*3/uL (ref 1.7–7.7)
Neutrophils Relative %: 72 %
Platelets: 204 10*3/uL (ref 150–400)
RBC: 3.54 MIL/uL — ABNORMAL LOW (ref 4.22–5.81)
RDW: 13.1 % (ref 11.5–15.5)
WBC: 9.9 10*3/uL (ref 4.0–10.5)
nRBC: 0 % (ref 0.0–0.2)

## 2020-06-29 LAB — COMPREHENSIVE METABOLIC PANEL
ALT: 34 U/L (ref 0–44)
AST: 47 U/L — ABNORMAL HIGH (ref 15–41)
Albumin: 2.4 g/dL — ABNORMAL LOW (ref 3.5–5.0)
Alkaline Phosphatase: 98 U/L (ref 38–126)
Anion gap: 8 (ref 5–15)
BUN: 35 mg/dL — ABNORMAL HIGH (ref 6–20)
CO2: 21 mmol/L — ABNORMAL LOW (ref 22–32)
Calcium: 8.8 mg/dL — ABNORMAL LOW (ref 8.9–10.3)
Chloride: 104 mmol/L (ref 98–111)
Creatinine, Ser: 4.83 mg/dL — ABNORMAL HIGH (ref 0.61–1.24)
GFR, Estimated: 13 mL/min — ABNORMAL LOW (ref >60)
Glucose, Bld: 152 mg/dL — ABNORMAL HIGH (ref 70–99)
Potassium: 4.5 mmol/L (ref 3.5–5.1)
Sodium: 133 mmol/L — ABNORMAL LOW (ref 135–145)
Total Bilirubin: 0.8 mg/dL (ref 0.3–1.2)
Total Protein: 6.4 g/dL — ABNORMAL LOW (ref 6.5–8.1)

## 2020-06-29 NOTE — ED Triage Notes (Signed)
Patient reports frontal migraine headache with nausea onset today unrelieved by OTC medications , he received Zofran 4 mg IV by EMS with relief , mild photophobia .

## 2020-06-30 ENCOUNTER — Encounter (HOSPITAL_COMMUNITY): Payer: Self-pay | Admitting: Emergency Medicine

## 2020-06-30 ENCOUNTER — Emergency Department (HOSPITAL_COMMUNITY): Payer: Managed Care, Other (non HMO)

## 2020-06-30 LAB — CBC
HCT: 30.9 % — ABNORMAL LOW (ref 39.0–52.0)
Hemoglobin: 10.1 g/dL — ABNORMAL LOW (ref 13.0–17.0)
MCH: 30.6 pg (ref 26.0–34.0)
MCHC: 32.7 g/dL (ref 30.0–36.0)
MCV: 93.6 fL (ref 80.0–100.0)
Platelets: 187 10*3/uL (ref 150–400)
RBC: 3.3 MIL/uL — ABNORMAL LOW (ref 4.22–5.81)
RDW: 13.1 % (ref 11.5–15.5)
WBC: 7.4 10*3/uL (ref 4.0–10.5)
nRBC: 0 % (ref 0.0–0.2)

## 2020-06-30 LAB — I-STAT CHEM 8, ED
BUN: 35 mg/dL — ABNORMAL HIGH (ref 6–20)
Calcium, Ion: 1.21 mmol/L (ref 1.15–1.40)
Chloride: 106 mmol/L (ref 98–111)
Creatinine, Ser: 5.1 mg/dL — ABNORMAL HIGH (ref 0.61–1.24)
Glucose, Bld: 139 mg/dL — ABNORMAL HIGH (ref 70–99)
HCT: 25 % — ABNORMAL LOW (ref 39.0–52.0)
Hemoglobin: 8.5 g/dL — ABNORMAL LOW (ref 13.0–17.0)
Potassium: 4.1 mmol/L (ref 3.5–5.1)
Sodium: 139 mmol/L (ref 135–145)
TCO2: 23 mmol/L (ref 22–32)

## 2020-06-30 LAB — BASIC METABOLIC PANEL
Anion gap: 8 (ref 5–15)
BUN: 36 mg/dL — ABNORMAL HIGH (ref 6–20)
CO2: 22 mmol/L (ref 22–32)
Calcium: 8.5 mg/dL — ABNORMAL LOW (ref 8.9–10.3)
Chloride: 107 mmol/L (ref 98–111)
Creatinine, Ser: 4.86 mg/dL — ABNORMAL HIGH (ref 0.61–1.24)
GFR, Estimated: 13 mL/min — ABNORMAL LOW (ref >60)
Glucose, Bld: 137 mg/dL — ABNORMAL HIGH (ref 70–99)
Potassium: 4 mmol/L (ref 3.5–5.1)
Sodium: 137 mmol/L (ref 135–145)

## 2020-06-30 MED ORDER — HYDRALAZINE HCL 25 MG PO TABS
12.5000 mg | ORAL_TABLET | Freq: Once | ORAL | Status: AC
  Administered 2020-06-30: 12.5 mg via ORAL
  Filled 2020-06-30: qty 1

## 2020-06-30 MED ORDER — CARVEDILOL 12.5 MG PO TABS
25.0000 mg | ORAL_TABLET | Freq: Two times a day (BID) | ORAL | Status: DC
Start: 2020-06-30 — End: 2020-06-30
  Administered 2020-06-30: 25 mg via ORAL
  Filled 2020-06-30: qty 2

## 2020-06-30 MED ORDER — FUROSEMIDE 20 MG PO TABS
80.0000 mg | ORAL_TABLET | Freq: Once | ORAL | Status: AC
  Administered 2020-06-30: 80 mg via ORAL
  Filled 2020-06-30: qty 4

## 2020-06-30 NOTE — ED Notes (Signed)
Family member verbalizes concern for wound on pt right leg, EDP notified and will advise on best practice.

## 2020-06-30 NOTE — Discharge Instructions (Addendum)
These keep wounds clean and dry and have rechecked by your primary care doctor next week Please follow-up with your nephrologist next week for ongoing monitoring of blood pressure and kidney function.

## 2020-06-30 NOTE — ED Provider Notes (Signed)
[facility] EMERGENCY DEPARTMENT Provider Note   CSN: [id number] Arrival date & time: 06/29/20  1941     History Chief Complaint  Patient presents with  . Headache /Hypertensive    Chase Clark is a 55 y.o. male.  HPI      55 year old man history of hypertension, end-stage renal disease, diabetes, coronary artery disease, status post CABG presents today complaining of high blood pressure, headache, and increased swelling.  He states that he knows he will have to start dialysis but has not started at this time.  He is concerned that he is at the point where he needed to start dialysis.  He takes Demadex and continues to make urine.  Patient states that he has had some increased swelling diffusely over the past several weeks.  His baseline weight is 230 and he thinks it is at about 250 now.  He ate barbecue for lunch yesterday.  He has some headaches.  Yesterday, he had a change in medications.  His metoprolol was discontinued and he was started on carvedilol.  He took his first dose of carvedilol in the morning.  In the mid afternoon he began having a headache start the base of his neck and radiated forward.  It lasted approximately 45 minutes and is now resolved.  He denies any trauma or blood thinner.  He has not had any fever or chills.  He and his wife called EMS.  He was in the waiting room.  He did not take his evening medications.  Around 3 AM this morning, he took his regular morning medications. Past Medical History:  Diagnosis Date  . Arthritis   . Asthma    as child  . Barrett's esophagus 02/16/2014   short segment  . Cataract   . Chronic kidney disease (CKD), stage IV (severe) ([place])   . Coronary artery disease    DES DIAG1 11/20/09 [facility])  . DDD (degenerative disc disease), cervical   . Diabetes mellitus without complication ([place])   . Diabetic osteomyelitis ([place])   . Diabetic peripheral neuropathy ([place])   . Diabetic ulcer of foot with muscle  involvement without evidence of necrosis ([place])    DIABETIC ULCERATIONS ASSOCIATED WITH IRRITATION LATERAL ANKLE LEFT GREATER THAN RIGHT WITH MILD CELLULITIS   . Diverticulosis   . DKA (diabetic ketoacidoses) 08/23/2017  . Edema, lower extremity   . Fatty liver   . Gastroparesis 2015  . GERD (gastroesophageal reflux disease)   . Gilbert's disease   . Glaucoma   . Heart disease 2011   Patient has stent for 80% blockage  . Hyperlipidemia   . Hypertension   . Left below-knee amputee ([place])   . Legally blind   . Myocardial infarction ([place])   . Neuropathy   . Osteoarthritis of spine   . Osteomyelitis of left foot ([place])   . PVD (peripheral vascular disease) ([place])   . Seasonal allergies   . Shoulder pain   . Ulcer of foot ([place]) 08.06.2014   DIABETIC ULCERATIONS ASSOCIATED WITH IRRITATION LATERAL ANKLE LEFT GREATER THAN RIGHT WITH MILD CELLULITIS    Patient Active Problem List   Diagnosis Date Noted  . Leg wound, right 06/07/2020  . Chronic headaches 06/07/2020  . Colon polyps 06/07/2020  . Bloating 03/02/2020  . 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 Type 1 diabetes mellitus with stage 4 chronic kidney disease ([place]) 06/30/2019  . Type 1 diabetes mellitus with diabetic polyneuropathy (HCC)  06/30/2019  . Diabetes mellitus type I ([place]) 06/30/2019  . Left below-knee amputee ([place]) 06/30/2019  . Swelling of limb 06/07/2019  . Vision changes 02/22/2019  . 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 Nephrotic syndrome 01/19/2019  . Allergic rhinitis 07/19/2018  . Amputation of left lower extremity below knee ([place]) 09/09/2017  . Benign prostatic hyperplasia   . Hyperlipidemia   . Chronic kidney disease (CKD), stage IV (severe) ([place])   . Neuropathic pain   . Coronary artery disease involving native coronary artery of native heart without angina pectoris   . Anemia of chronic disease   . Cervical spondylosis with myelopathy and radiculopathy 05/07/2017   . Fatty liver 04/04/2017  . DDD (degenerative disc disease), cervical 01/13/2017  . PAD (peripheral artery disease) ([place]) 08/07/2016  . Risk for falls 07/28/2016  . Diabetic Charcot foot ([place]) 07/21/2016  . Nondisplaced fracture of left calcaneus 07/10/2016  . Wound infection 07/10/2016  . Acute osteomyelitis of right foot ([place]) 07/03/2016  . Diabetic peripheral neuropathy ([place]) 07/03/2016  . Chronic diarrhea 05/17/2016  . Peripheral vascular disease of extremity with claudication ([place]) 09/12/2015  . Anemia 01/12/2014  . Disorder of ligament of ankle 07/31/2013  . Edema 06/28/2013  . 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 Obesity (BMI 30-39.9) 06/12/2013  . Pain in joint, shoulder region 06/12/2013  . Statin intolerance 06/10/2013  . GILBERT'S SYNDROME 07/17/2006  . Essential hypertension 07/17/2006  . Coronary atherosclerosis 07/17/2006  . OSTEOARTHRITIS 07/17/2006    Past Surgical History:  Procedure Laterality Date  . ABDOMINAL AORTOGRAM N/A 05/20/2016   Procedure: Abdominal Aortogram possible intervention;  Surgeon: Katha Cabal, MD;  Location: [facility] CV LAB;  Service: Cardiovascular;  Laterality: N/A;  . AMPUTATION Left 09/05/2017   Procedure: AMPUTATION BELOW KNEE;  Surgeon: Tania Ade, MD;  Location: [facility];  Service: Orthopedics;  Laterality: Left;  . ANTERIOR CERVICAL DECOMP/DISCECTOMY FUSION N/A 05/07/2017   Procedure: ANTERIOR CERVICAL DECOMPRESSION/DISCECTOMY Luevenia Maxin PROSTHESIS,PLATE/SCREWS CERVICAL FIVE - CERVICAL SIX;  Surgeon: Newman Pies, MD;  Location: [facility];  Service: Neurosurgery;  Laterality: N/A;  . CARDIAC CATHETERIZATION    . CATARACT EXTRACTION W/ INTRAOCULAR LENS IMPLANT    . CATARACT EXTRACTION W/PHACO Left 02/26/2017   Procedure: CATARACT EXTRACTION PHACO AND INTRAOCULAR LENS PLACEMENT (IOC);  Surgeon: Eulogio Bear, MD;  Location: [facility];  Service: Ophthalmology;  Laterality: Left;  Lot # [id number] H [place]:  00:25.3 AP%: 6.2 CDE: 1.59   . CHOLECYSTECTOMY N/A   . CORONARY ANGIOPLASTY WITH STENT PLACEMENT  2007   1st diagonal  . CORONARY ARTERY BYPASS GRAFT N/A 01/27/2019   Procedure: OFF PUMP CORONARY ARTERY BYPASS GRAFTING (CABG) X 2 WITH ENDOSCOPIC HARVESTING OF RIGHT GREATER SAPHENOUS VEIN. LIMA TO LAD;  Surgeon: Lajuana Matte, MD;  Location: [facility];  Service: Open Heart Surgery;  Laterality: N/A;  . CORONARY STENT INTERVENTION N/A 01/21/2019   Procedure: CORONARY STENT INTERVENTION;  Surgeon: Belva Crome, MD;  Location: [facility];  Service: Cardiovascular;  Laterality: N/A;  . EPIBLEPHERON REPAIR WITH TEAR DUCT PROBING    . EYE SURGERY Right sept 29n2015   cataract extraction  . INSERTION EXPRESS TUBE SHUNT Right 09/06/2015   Procedure: INSERTION AHMED TUBE SHUNT with tutoplast allograft;  Surgeon: Eulogio Bear, MD;  Location: [facility];  Service: Ophthalmology;  Laterality: Right;  . LEFT HEART CATH AND CORONARY ANGIOGRAPHY N/A 01/20/2019   Procedure: LEFT HEART CATH AND CORONARY ANGIOGRAPHY;  Surgeon: Lorretta Harp, MD;  Location: [facility];  Service: Cardiovascular;  Laterality: N/A;  . TEE WITHOUT CARDIOVERSION N/A 01/27/2019   Procedure: TRANSESOPHAGEAL ECHOCARDIOGRAM (TEE);  Surgeon: Lajuana Matte, MD;  Location: [facility];  Service: Open Heart Surgery;  Laterality: N/A;  . VASECTOMY         Family History  Problem Relation Age of Onset  . Hyperlipidemia Mother   . Diabetes Mother   . Liver disease Mother        NASH  . Other Mother        immune thrombocytopenic pupura (ITP)  . Hyperlipidemia Father   . Diabetes Father   . Heart disease Father   . Hypertension Father   . Lymphoma Father   . Hemochromatosis Other        Grandmother (? which side)  . Polycythemia Other   . Colon cancer Neg Hx   . Esophageal cancer Neg Hx   . Rectal cancer Neg Hx   . Stomach cancer Neg Hx     Social History   Tobacco Use  . Smoking status: Former  Smoker    Types: Cigarettes    Quit date: 03/14/2008    Years since quitting: 12.3  . Smokeless tobacco: Never Used  Vaping Use  . Vaping Use: Never used  Substance Use Topics  . Alcohol use: Yes    Comment: OCCAS  . Drug use: No    Home Medications Prior to Admission medications   Medication Sig Start Date End Date Taking? Authorizing Provider  insulin degludec (TRESIBA FLEXTOUCH) 200 UNIT/ML FlexTouch Pen Inject 40 Units into the skin 2 (two) times daily. 06/18/20   Shamleffer, Melanie Crazier, MD  acetaminophen (TYLENOL) 325 MG tablet Take 1 tablet (325 mg total) by mouth every 6 (six) hours as needed for mild pain (pain score 1-3 or temp > 100.5). 09/06/17   Grier Mitts, PA-C  amLODipine (NORVASC) 5 MG tablet Take 2.5 mg by mouth daily. 11/30/19   [provider]  aspirin (ASPIRIN CHILDRENS) 81 MG chewable tablet Chew 1 tablet (81 mg total) by mouth daily. 03/04/19   Lajuana Matte, MD  atorvastatin (LIPITOR) 80 MG tablet TAKE 1 TABLET BY MOUTH EVERY DAY 12/01/19   [name], MD  brimonidine-timolol (COMBIGAN) 0.2-0.5 % ophthalmic solution Place 1 drop into the right eye every 12 (twelve) hours.     [provider]  carvedilol (COREG) 25 MG tablet Take 1 tablet (25 mg total) by mouth 2 (two) times daily with a meal. 06/28/20   [name], MD  cholecalciferol (VITAMIN D3) 25 MCG (1000 UT) tablet Take 1,000 Units by mouth daily.    [provider]  Continuous Blood Gluc Sensor (DEXCOM G6 SENSOR) MISC Inject 1 Device into the skin as directed. Place a new sensor every 10 days to check blood sugar at least 4 times daily 02/22/20   Shamleffer, Melanie Crazier, MD  Continuous Blood Gluc Transmit (DEXCOM G6 TRANSMITTER) MISC Inject 1 Device into the skin as directed. Use to check blood sugar at least 4 times daily 02/22/20   Shamleffer, Melanie Crazier, MD  dorzolamide (TRUSOPT) 2 % ophthalmic solution 1 drop 2 (two) times daily. 10/18/19    [provider]  ezetimibe (ZETIA) 10 MG tablet Take 1 tablet (10 mg total) by mouth daily. 03/22/20   [name], MD  fluticasone [facility]) 50 MCG/ACT nasal spray SPRAY 2 SPRAYS INTO EACH NOSTRIL EVERY DAY 11/14/19   [name], MD  gabapentin (NEURONTIN)  300 MG capsule Take 1 capsule (300 mg total) by mouth 2 (two) times daily. 01/19/20   [name], MD  hydrALAZINE (APRESOLINE) 25 MG tablet Take 12.5 mg by mouth in the morning and at bedtime. 01/26/20   [provider]  Insulin Glargine (BASAGLAR KWIKPEN) 100 UNIT/ML Inject 40 Units into the skin 2 (two) times daily. Further refills to come from endocrinology 02/22/20   Shamleffer, Melanie Crazier, MD  insulin lispro (HUMALOG KWIKPEN) 100 UNIT/ML KwikPen Inject 6 Units into the skin 3 (three) times daily. Inject up to 15 units TID with meals. Further refills to come from endocrinology 02/22/20   Shamleffer, Melanie Crazier, MD  Insulin Pen Needle 32G X 4 MM MISC Inject 5 pens into the skin daily. Use to inject Lantus and Novolog up to 5 times daily 02/22/20   Shamleffer, Melanie Crazier, MD  Lactulose 20 GM/30ML SOLN Take 30 mLs (20 g total) by mouth as needed. 03/04/19   Lightfoot, Lucile Crater, MD  latanoprost (XALATAN) 0.005 % ophthalmic solution Place 1 drop into both eyes at bedtime. 03/03/17   [provider]  lisinopril (ZESTRIL) 5 MG tablet Take 1 tablet (5 mg total) by mouth daily. 03/23/20   [name], MD  metoCLOPramide (REGLAN) 10 MG tablet Take 1 tablet (10 mg total) by mouth at bedtime. 05/29/20   Pyrtle, Lajuan Lines, MD  Multiple Vitamin (MULTIVITAMIN) tablet Take 1 tablet by mouth daily.    [provider]  omeprazole (PRILOSEC) 20 MG capsule Take 20 mg by mouth daily.    [provider]  senna (SENOKOT) 8.6 MG tablet Take 1 tablet by mouth daily.     [provider]  torsemide (DEMADEX) 20 MG tablet Take 20 mg by mouth. 03/07/20 03/07/21  [provider]    Allergies    Ozempic (0.25 or 0.5 mg-dose) [semaglutide(0.25 or 0.5mg -dos)]  Review of Systems   Review of Systems  Constitutional: Positive for fatigue.  HENT: Negative.   Eyes: Negative.   Respiratory: Positive for shortness of breath.   Cardiovascular: Negative.   Gastrointestinal: Negative.   Endocrine: Negative.   Genitourinary: Negative.   Musculoskeletal: Negative.   Skin:       Patient has chronic skin breakdown on the right lower extremity that he feels is worsened with the swelling He has a right toe injury where he struck it yesterday.  Allergic/Immunologic: Negative.   Neurological: Negative.   Hematological: Negative.   Psychiatric/Behavioral: Negative.   All other systems reviewed and are negative.   Physical Exam Updated Vital Signs BP 109/76 (BP Location: Right Arm)   Pulse 67   Temp 98.4 F (36.9 C) (Oral)   Resp 16   Ht 1.778 m (5\' 10" )   Wt 120 kg   SpO2 99%   BMI 37.96 kg/m   Physical Exam Vitals and nursing note reviewed.  Constitutional:      Appearance: Normal appearance.  HENT:     Head: Normocephalic.     Right Ear: External ear normal.     Nose: Nose normal.     Mouth/Throat:     Pharynx: Oropharynx is clear.  Eyes:     Pupils: Pupils are equal, round, and reactive to light.  Cardiovascular:     Rate and Rhythm: Normal rate and regular rhythm.     Pulses: Normal pulses.  Pulmonary:     Effort: Pulmonary effort is normal.  Abdominal:     General: Abdomen is flat.  Palpations: Abdomen is soft.  Musculoskeletal:     Cervical back: Normal range of motion.     Comments: rll with some erythema and weepin Right great toe with laceration Left stump with some breakdown  Skin:    General: Skin is warm.     Capillary Refill: Capillary refill takes less than 2 seconds.  Neurological:     General: No focal deficit present.     Mental Status: He is alert.  Psychiatric:        Mood and Affect: Mood normal.        Behavior:  Behavior normal.     ED Results / Procedures / Treatments   Labs (all labs ordered are listed, but only abnormal results are displayed) Labs Reviewed  CBC WITH DIFFERENTIAL/PLATELET - Abnormal; Notable for the following components:      Result Value   RBC 3.54 (*)    Hemoglobin 11.1 (*)    HCT 32.5 (*)    All other components within normal limits  COMPREHENSIVE METABOLIC PANEL - Abnormal; Notable for the following components:   Sodium 133 (*)    CO2 21 (*)    Glucose, Bld 152 (*)    BUN 35 (*)    Creatinine, Ser 4.83 (*)    Calcium 8.8 (*)    Total Protein 6.4 (*)    Albumin 2.4 (*)    AST 47 (*)    GFR, Estimated 13 (*)    All other components within normal limits  CBC - Abnormal; Notable for the following components:   RBC 3.30 (*)    Hemoglobin 10.1 (*)    HCT 30.9 (*)    All other components within normal limits  BASIC METABOLIC PANEL - Abnormal; Notable for the following components:   Glucose, Bld 137 (*)    BUN 36 (*)    Creatinine, Ser 4.86 (*)    Calcium 8.5 (*)    GFR, Estimated 13 (*)    All other components within normal limits  I-STAT CHEM 8, ED - Abnormal; Notable for the following components:   BUN 35 (*)    Creatinine, Ser 5.10 (*)    Glucose, Bld 139 (*)    Hemoglobin 8.5 (*)    HCT 25.0 (*)    All other components within normal limits    EKG EKG Interpretation  Date/Time:  Saturday June 30 2020 07:56:31 EDT Ventricular Rate:  68 PR Interval:    QRS Duration: 101 QT Interval:  417 QTC Calculation: 444 R Axis:   50 Text Interpretation: Sinus rhythm Borderline prolonged PR interval Probable anteroseptal infarct, old T wave inversion Inferolateral leads Confirmed by Pattricia Boss [phone]) on 06/30/2020 8:56:35 AM   Radiology DG Chest Port 1 View  Result Date: 06/30/2020 CLINICAL DATA:  56 year old male with end-stage renal disease. Right 1st toe gangrene. EXAM: PORTABLE CHEST 1 VIEW COMPARISON:  Chest radiographs 03/04/2019 and earlier.  FINDINGS: Portable AP semi upright view at 0740 hours. Prior CABG. Lung volumes and mediastinal contours are stable and within normal limits. Visualized tracheal air column is within normal limits. No pneumothorax. Allowing for portable technique the lungs are clear. Chronic lower cervical ACDF. No acute osseous abnormality identified. IMPRESSION: No acute cardiopulmonary abnormality. Electronically Signed   By: Genevie Ann M.D.   On: 06/30/2020 08:30   DG Toe Great Right  Result Date: 06/30/2020 CLINICAL DATA:  55 year old male with end-stage renal disease. Right 1st toe gangrene. EXAM: RIGHT GREAT TOE COMPARISON:  Right foot series 05/04/2013.  FINDINGS: Preserved normal bone mineralization in the right 1st Abby Stines and visible right foot. No cortical osteolysis. No acute osseous abnormality identified. Generalized soft tissue swelling. No soft tissue gas. There is some calcified peripheral vascular disease evident between the 1st and 2nd metatarsals. IMPRESSION: Soft tissue swelling with no acute osseous abnormality identified. No radiographic evidence of osteomyelitis. Electronically Signed   By: Genevie Ann M.D.   On: 06/30/2020 08:31    Procedures Procedures   Medications Ordered in ED Medications  furosemide (LASIX) tablet 80 mg (has no administration in time range)    ED Course  I have reviewed the triage vital signs and the nursing notes.  Pertinent labs & imaging results that were available during my care of the patient were reviewed by me and considered in my medical decision making (see chart for details).    MDM Rules/Calculators/A&P                         Patient presents with cc of headache and hypertension.  Both are improved.  Headache has resolved and does not appear to need imagine.  BP improved despite missed medication doses.  Patient given am bp meds here. Potassium stable Creatinine slightly increased Glucose stable Chest x-Runa Whittingham without evidence of volume overload EKG without any  evidence of hyperkalemia, however he does have T wave inversion in his inferior lateral leads.  Is currently not having any associated chest pain. He will be advised to follow-up with his cardiologist regarding this Swelling patient has had some increased fluid retention which may be due to some changes in medication and dietary indiscretions.  He is advised regarding salt and fat intake Right lower extremity with some chronic wound.  This area was cleaned does not appear to be acute definitely infected Right toe injury from yesterday shows no underlying fracture.  Wound care done here in ED Initial labs with normal hemoglobin and potassium and slight increase in creatinine repeat i-STAT here in the ED showed significant drop in hemoglobin which is not explained by his clinical presentation.  Repeat CBC and BMP pending Repeat labs reviewed hemoglobin 10 and creatinine stable. Patient advised regarding need for close follow-up. Will need outpatient wound recheck Will need to follow-up with his nephrologist regarding blood pressure and increasing creatinine is likely need for dialysis soon. Final Clinical Impression(s) / ED Diagnoses Final diagnoses:  ESRD (end stage renal disease) ([place])  Injury of toe on right foot, initial encounter  Fluid retention    Rx / DC Orders ED Discharge Orders    None       Pattricia Boss, MD 06/30/20 0945

## 2020-07-04 ENCOUNTER — Telehealth: Payer: Self-pay | Admitting: Family Medicine

## 2020-07-04 MED ORDER — CLONIDINE 0.1 MG/24HR TD PTWK: 0.1000 mg | MEDICATED_PATCH | TRANSDERMAL | 3 refills | Status: DC

## 2020-07-04 NOTE — Telephone Encounter (Signed)
Called and LVM for the patient's wife to call back.   Nina,cma

## 2020-07-04 NOTE — Telephone Encounter (Signed)
I would suggest that we add a clonidine patch.  This was sent to his pharmacy.  He should have a blood pressure check in 2 weeks.

## 2020-07-04 NOTE — Telephone Encounter (Signed)
patient 's wife called about his B/p medication wanted a call back

## 2020-07-04 NOTE — Telephone Encounter (Signed)
I reached the patient's wife and she stated the patient was put on Carvedilol and it is not doing any good, he took it on Thursday night and Friday and his BP went up so high the went to the ER.  Patient stated today she took the BP and it was 180/101 and she wants to know what to do this medication is not helping his BP.  Nina,cma

## 2020-07-04 NOTE — Telephone Encounter (Signed)
I called the patient and informed the patient's wife that the provider sent in a clonidine patch to his pharmacy and she scheduled a 2 week nurse visit to recheck.  Nina,cma

## 2020-07-05 ENCOUNTER — Ambulatory Visit: Payer: Managed Care, Other (non HMO) | Admitting: Pharmacist

## 2020-07-05 DIAGNOSIS — I10 Essential (primary) hypertension: Secondary | ICD-10-CM

## 2020-07-05 DIAGNOSIS — E103593 Type 1 diabetes mellitus with proliferative diabetic retinopathy without macular edema, bilateral: Secondary | ICD-10-CM

## 2020-07-05 DIAGNOSIS — N184 Chronic kidney disease, stage 4 (severe): Secondary | ICD-10-CM

## 2020-07-05 DIAGNOSIS — I739 Peripheral vascular disease, unspecified: Secondary | ICD-10-CM

## 2020-07-05 DIAGNOSIS — E785 Hyperlipidemia, unspecified: Secondary | ICD-10-CM

## 2020-07-05 DIAGNOSIS — D638 Anemia in other chronic diseases classified elsewhere: Secondary | ICD-10-CM

## 2020-07-05 NOTE — Patient Instructions (Signed)
Visit Information  Goals Addressed              This Visit's Progress     Patient Stated   .  Medication Monitoring (pt-stated)        Patient Goals/Self-Care Activities . Over the next 90 days, patient will:  - take medications as prescribed check glucose continuously using CGM, document, and provide at future appointments check blood pressure daily, document, and provide at future appointments Communicate with specialty providers       Patient verbalizes understanding of instructions provided today and agrees to view in [place].   Plan: Telephone follow up appointment with care management team member scheduled for:  ~ 4 weeks as previously scheduled  Catie Darnelle Maffucci, PharmD, [employer] [place] Clinical Pharmacist [employer] [facility] [employer] [phone]

## 2020-07-05 NOTE — Telephone Encounter (Signed)
Received voicemail from patient's wife, [name]. She notes she received a message from the pharmacy that a script for Tyler Aas was ready. Patient is currently on Basaglar and Humalog.   Per chart review, appears a refill request may have come in for detemir and degludec? Unsure if this is a new Market researcher. Appears Tyler Aas was sent to the pharmacy by endocrinology.   Called Terra back, left voicemail to encourage to discuss w/ endocrinology. Will route message to Dr. Kelton Pillar as well for clarification.

## 2020-07-05 NOTE — Chronic Care Management (AMB) (Signed)
Care Management   Pharmacy Note  07/05/2020 Name: Chase Clark MRN: [id number] DOB: 18-Aug-1965  Subjective: Chase Clark is a 55 y.o. year old male who is a primary care patient of [name], [name], MD. The Care Management team was consulted for assistance with care management and care coordination needs.    Engaged with patient's wife, Chase Clark  for response to medication management questions in response to provider referral for pharmacy case management and/or care coordination services.   The patient was given information about Care Management services today including:  1. Care Management services includes personalized support from designated clinical staff supervised by the patient's primary care provider, including individualized plan of care and coordination with other care providers. 2. 24/7 contact phone numbers for assistance for urgent and routine care needs. 3. The patient may stop case management services at any time by phone call to the office staff.  Patient agreed to services and consent obtained.  Assessment:  Review of patient status, including review of consultants reports, laboratory and other test data, was performed as part of comprehensive evaluation and provision of chronic care management services.   SDOH (Social Determinants of Health) assessments and interventions performed:  SDOH Interventions   Flowsheet Row Most Recent Value  SDOH Interventions   Financial Strain Interventions Intervention Not Indicated       Objective:  Lab Results  Component Value Date   CREATININE 4.86 (H) 06/30/2020   CREATININE 5.10 (H) 06/30/2020   CREATININE 4.83 (H) 06/29/2020    Lab Results  Component Value Date   HGBA1C 7.0 (H) 06/05/2020       Component Value Date/Time   CHOL 119 06/05/2020 0838   TRIG 274.0 (H) 06/05/2020 0838   HDL 33.80 (L) 06/05/2020 0838   CHOLHDL 4 06/05/2020 0838   VLDL 54.8 (H) 06/05/2020 0838   LDLCALC 55 05/24/2019 0905    LDLDIRECT 43.0 06/05/2020 0838   Lab Results  Component Value Date   WBC 7.4 06/30/2020   HGB 10.1 (L) 06/30/2020   HCT 30.9 (L) 06/30/2020   MCV 93.6 06/30/2020   PLT 187 06/30/2020   Lab Results  Component Value Date   TSH 3.13 12/02/2017   Last vitamin D Lab Results  Component Value Date   VD25OH 10.67 (L) 06/05/2020    Clinical ASCVD: Yes     BP Readings from Last 3 Encounters:  06/30/20 (!) 147/91  06/05/20 140/80  05/21/20 (!) 151/80    Care Plan  No Active Allergies  Medications Reviewed Today    Reviewed by Lavenia Atlas, Intern-Pharmacy (Student-PharmD) on 06/30/20 at 0759  Med List Status: Complete  Medication Order Taking? Sig Documenting Provider Last Dose Status Informant  acetaminophen (TYLENOL) 325 MG tablet [id number] Yes Take 1 tablet (325 mg total) by mouth every 6 (six) hours as needed for mild pain (pain score 1-3 or temp > 100.5). Grier Mitts, PA-C 06/29/2020 Unknown time Active Spouse/Significant Other  aspirin (ASPIRIN CHILDRENS) 81 MG chewable tablet [id number] Yes Chew 1 tablet (81 mg total) by mouth daily. Lajuana Matte, MD 06/29/2020 Unknown time Active Spouse/Significant Other  atorvastatin (LIPITOR) 80 MG tablet [id number] Yes TAKE 1 TABLET BY MOUTH EVERY DAY [name], MD 06/28/2020 Unknown time Active Spouse/Significant Other  brimonidine-timolol (COMBIGAN) 0.2-0.5 % ophthalmic solution [id number] Yes Place 1 drop into the right eye every 12 (twelve) hours.  [provider] 06/29/2020 Unknown time Active Spouse/Significant Other           Med  Note Tamsen Snider   Mon Feb 14, 2019  9:49 AM)    carvedilol (COREG) 25 MG tablet [id number] Yes Take 1 tablet (25 mg total) by mouth 2 (two) times daily with a meal. [name], MD 06/29/2020 0900 Active Spouse/Significant Other  cholecalciferol (VITAMIN D3) 25 MCG (1000 UT) tablet [id number] Yes Take 1,000 Units by mouth daily. [provider] 06/29/2020  Unknown time Active Spouse/Significant Other  Continuous Blood Gluc Sensor (DEXCOM G6 SENSOR) MISC [id number]  Inject 1 Device into the skin as directed. Place a new sensor every 10 days to check blood sugar at least 4 times daily Shamleffer, Melanie Crazier, MD  Active Spouse/Significant Other  Continuous Blood Gluc Transmit (DEXCOM G6 TRANSMITTER) MISC [id number]  Inject 1 Device into the skin as directed. Use to check blood sugar at least 4 times daily Shamleffer, Melanie Crazier, MD  Active Spouse/Significant Other  dorzolamide (TRUSOPT) 2 % ophthalmic solution [id number] Yes 1 drop 2 (two) times daily. [provider] 06/29/2020 Unknown time Active Spouse/Significant Other  esomeprazole (NEXIUM) 20 MG capsule [id number] Yes Take 20 mg by mouth daily at 12 noon. [provider] 06/29/2020 Unknown time Active Spouse/Significant Other  ezetimibe (ZETIA) 10 MG tablet [id number] Yes Take 1 tablet (10 mg total) by mouth daily. [name], MD 06/28/2020 Active Spouse/Significant Other  fluticasone (FLONASE) 50 MCG/ACT nasal spray [id number] Yes SPRAY 2 SPRAYS INTO EACH NOSTRIL EVERY DAY  Patient taking differently: Place 1 spray into both nostrils daily as needed for allergies.   [name], MD UNK Active Spouse/Significant Other  gabapentin (NEURONTIN) 300 MG capsule [id number] Yes Take 1 capsule (300 mg total) by mouth 2 (two) times daily. [name], MD 06/30/2020 Unknown time Active Spouse/Significant Other  hydrALAZINE (APRESOLINE) 25 MG tablet [id number] Yes Take 12.5 mg by mouth in the morning and at bedtime. [provider] 06/30/2020 Unknown time Active Spouse/Significant Other  insulin degludec (TRESIBA FLEXTOUCH) 200 UNIT/ML FlexTouch Clark [id number] No Inject 40 Units into the skin 2 (two) times daily.  Patient not taking: Reported on 06/30/2020   Shamleffer, Melanie Crazier, MD Not Taking Unknown time Consider Medication Status and Discontinue  Spouse/Significant Other  Insulin Glargine (BASAGLAR KWIKPEN) 100 UNIT/ML [id number] Yes Inject 40 Units into the skin 2 (two) times daily. Further refills to come from endocrinology Shamleffer, Melanie Crazier, MD 06/29/2020 Unknown time Active Spouse/Significant Other  insulin lispro (HUMALOG KWIKPEN) 100 UNIT/ML KwikPen [id number] Yes Inject 6 Units into the skin 3 (three) times daily. Inject up to 15 units TID with meals. Further refills to come from endocrinology Shamleffer, Melanie Crazier, MD 06/30/2020 Active Spouse/Significant Other           Med Note (TRAVIS, CATHERINE E   Thu Apr 12, 2020  9:20 AM) 6-8 units with meals   Insulin Clark Needle 32G X 4 MM MISC [id number]  Inject 5 pens into the skin daily. Use to inject Lantus and Novolog up to 5 times daily Shamleffer, Melanie Crazier, MD  Active Spouse/Significant Other  Lactulose 20 GM/30ML SOLN [id number] No Take 30 mLs (20 g total) by mouth as needed.  Patient not taking: Reported on 06/30/2020   Lajuana Matte, MD Not Taking Unknown time Consider Medication Status and Discontinue Spouse/Significant Other  latanoprost (XALATAN) 0.005 % ophthalmic solution [id number] Yes Place 1 drop into both eyes at bedtime. [provider] 06/29/2020 Unknown time Active Spouse/Significant Other           Med Note (GONZALEZ, DANIELLE R  Mon Feb 14, 2019  9:49 AM)    lisinopril (ZESTRIL) 5 MG tablet [id number] Yes Take 1 tablet (5 mg total) by mouth daily. [name], MD 06/30/2020 0300 Active Spouse/Significant Other  metoCLOPramide (REGLAN) 10 MG tablet [id number] Yes Take 1 tablet (10 mg total) by mouth at bedtime. Jerene Bears, MD 06/28/2020 Active Spouse/Significant Other  Multiple Vitamin (MULTIVITAMIN) tablet [id number] Yes Take 1 tablet by mouth daily. [provider] 06/30/2020 Unknown time Active Spouse/Significant Other  senna (SENOKOT) 8.6 MG tablet [id number] Yes Take 2 tablets by mouth daily as needed for constipation.  [provider] 06/29/2020 Unknown time Active Spouse/Significant Other  torsemide (DEMADEX) 20 MG tablet [id number] Yes Take 20 mg by mouth daily. [provider] 06/30/2020 0300 Active Spouse/Significant Other          Patient Active Problem List   Diagnosis Date Noted  . Leg wound, right 06/07/2020  . Chronic headaches 06/07/2020  . Colon polyps 06/07/2020  . Bloating 03/02/2020  . 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 Type 1 diabetes mellitus with stage 4 chronic kidney disease ([place]) 06/30/2019  . Type 1 diabetes mellitus with diabetic polyneuropathy ([place]) 06/30/2019  . Diabetes mellitus type I ([place]) 06/30/2019  . Left below-knee amputee ([place]) 06/30/2019  . Swelling of limb 06/07/2019  . Vision changes 02/22/2019  . 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 Nephrotic syndrome 01/19/2019  . Allergic rhinitis 07/19/2018  . Amputation of left lower extremity below knee ([place]) 09/09/2017  . Benign prostatic hyperplasia   . Hyperlipidemia   . Chronic kidney disease (CKD), stage IV (severe) ([place])   . Neuropathic pain   . Coronary artery disease involving native coronary artery of native heart without angina pectoris   . Anemia of chronic disease   . Cervical spondylosis with myelopathy and radiculopathy 05/07/2017  . Fatty liver 04/04/2017  . DDD (degenerative disc disease), cervical 01/13/2017  . PAD (peripheral artery disease) ([place]) 08/07/2016  . Risk for falls 07/28/2016  . Diabetic Charcot foot ([place]) 07/21/2016  . Nondisplaced fracture of left calcaneus 07/10/2016  . Wound infection 07/10/2016  . Acute osteomyelitis of right foot ([place]) 07/03/2016  . Diabetic peripheral neuropathy ([place]) 07/03/2016  . Chronic diarrhea 05/17/2016  . Peripheral vascular disease of extremity with claudication ([employer]) 09/12/2015  . Anemia 01/12/2014  . Disorder of ligament of ankle 07/31/2013  . Edema 06/28/2013  . Impotence due to erectile  dysfunction 06/12/2013  . Obesity (BMI 30-39.9) 06/12/2013  . Pain in joint, shoulder region 06/12/2013  . Statin intolerance 06/10/2013  . GILBERT'S SYNDROME 07/17/2006  . Essential hypertension 07/17/2006  . Coronary atherosclerosis 07/17/2006  . OSTEOARTHRITIS 07/17/2006    Conditions to be addressed/monitored: HTN, HLD, DMII and CKD  Care Plan : Medication Management  Updates made by De Hollingshead, RPH-CPP since 07/05/2020 12:00 AM    Problem: Diabetes, CAD (hx CABG, HTN), CKD     Long-Range Goal: Disease Progression Prevention   This Visit's Progress: On track  Recent Progress: On track  Priority: High  Note:   Current Barriers:  . Unable to achieve control of diabetes, hypertension   Pharmacist Clinical Goal(s):  Marland Kitchen Over the next 90 days, patient will achieve adherence to monitoring guidelines and medication adherence to achieve therapeutic efficacy through collaboration with PharmD and provider.    Interventions: . 1:1 collaboration with [name], MD regarding development and update of comprehensive plan of care as evidenced by provider  attestation and co-signature . Inter-disciplinary care team collaboration (see longitudinal plan of care) . Comprehensive medication review performed; medication list updated in electronic medical record  Diabetes, treated as T1: . Uncontrolled; current treatment: Basaglar 40 units BID, Novolog per sliding scale, 6-10 units with meals . Received call from [place] regarding a script for Tyler Aas that was sent to the pharmacy.  o Hx significant GI upset, occular migraines w/ Ozempic o Renal fx limits metformin, SGLT2 . Encouraged to communicate with insurance and endocrinology to determine if an insurance preference triggered the script for Tyler Aas to be sent. Terra verbalized understanding  Hypertension with CKD: . Uncontrolled; current treatment: lisinopril 5 mg daily, hydralazine 12.5 mg BID, carvedilol 25 mg BID, torsemide  20 mg daily; clonidine 0.1 mg patch added by PCP today  o LEE w/ amlodipine . Counseled on clonidine mechanism, side effects, including sedation, dizziness, dry mouth, dry eyes, constipation urinary retention, but that anticholinergic side effects are generally less with patch formulation.  . Continue current regimen at this time. Chase Clark notes they have nephrology f/u on Monday.   Hyperlipidemia: . Controlled; current treatment: atorvastatin 80 mg daily, ezetimibe 10 mg daily . Recommend to continue current regimen at this time.  CKD:  . Managed by Dr. Candiss Norse. F/u on Monday. Recent lab results showed anemia, Vitamin D deficiency.  . Encouraged Terra to collaborate w/ Dr. Candiss Norse management of long term effects of CKD.  Allergies: . Controlled; current using fluticasone nasal spray PRN . Recommend to continue current regimen at this time.   GI (GERD hx Barrett's, gastroparesis, family hx NASH): . Appropriately managed per current regimen: omeprazole 20 mg daily for GERD; senna daily for constipation . Recommend to continue current regimen with dietary modifications at this time.    Patient Goals/Self-Care Activities . Over the next 90 days, patient will:  - take medications as prescribed check glucose at least at least three times daily using CGM, document, and provide at future appointments check blood pressure daily, document, and provide at future appointments Communicate with specialty providers  Follow Up Plan: Telephone follow up appointment with care management team member scheduled for: ~ 4 weeks     Medication Assistance:  None required.  Patient affirms current coverage meets needs.  Follow Up:  Patient agrees to Care Plan and Follow-up.  Plan: Telephone follow up appointment with care management team member scheduled for:  ~ 4 weeks as previously scheduled  Catie Darnelle Maffucci, PharmD, [place], [place] Clinical Pharmacist [employer] [facility] [phone]

## 2020-07-09 ENCOUNTER — Telehealth: Payer: Self-pay

## 2020-07-09 NOTE — Telephone Encounter (Signed)
Made in error

## 2020-07-09 NOTE — Telephone Encounter (Signed)
Patients wife would like a call back ASAP. They are wanting to know why patient received medication. They were not aware new medication was being sent in and also thought medication already taken was doing just fine.   Medication calling about  insulin degludec (TRESIBA FLEXTOUCH) 200 UNIT/ML FlexTouch Pen   Please call and advise

## 2020-07-09 NOTE — Telephone Encounter (Signed)
Called this morning and spoken to patient's wife [name])  Inform that back on 06/18/2020, the pharmacy sent a request to change inulin due to insurance. We did the change as requested and thought since the [facility] notified us of the change that they would notified patient as well.

## 2020-07-18 ENCOUNTER — Ambulatory Visit: Payer: Managed Care, Other (non HMO)

## 2020-07-25 ENCOUNTER — Other Ambulatory Visit: Payer: Self-pay | Admitting: Internal Medicine

## 2020-07-25 ENCOUNTER — Ambulatory Visit: Payer: Managed Care, Other (non HMO) | Admitting: Plastic Surgery

## 2020-07-25 ENCOUNTER — Other Ambulatory Visit: Payer: Self-pay | Admitting: Family Medicine

## 2020-07-26 ENCOUNTER — Ambulatory Visit: Payer: Managed Care, Other (non HMO) | Admitting: Pharmacist

## 2020-07-26 DIAGNOSIS — D638 Anemia in other chronic diseases classified elsewhere: Secondary | ICD-10-CM

## 2020-07-26 DIAGNOSIS — N184 Chronic kidney disease, stage 4 (severe): Secondary | ICD-10-CM

## 2020-07-26 DIAGNOSIS — E103593 Type 1 diabetes mellitus with proliferative diabetic retinopathy without macular edema, bilateral: Secondary | ICD-10-CM

## 2020-07-26 DIAGNOSIS — I739 Peripheral vascular disease, unspecified: Secondary | ICD-10-CM

## 2020-07-26 DIAGNOSIS — I10 Essential (primary) hypertension: Secondary | ICD-10-CM

## 2020-07-26 DIAGNOSIS — E785 Hyperlipidemia, unspecified: Secondary | ICD-10-CM

## 2020-07-26 NOTE — Patient Instructions (Signed)
Visit Information  Goals Addressed              This Visit's Progress     Patient Stated   .  Medication Monitoring (pt-stated)        Patient Goals/Self-Care Activities . Over the next 90 days, patient will:  - take medications as prescribed check glucose continuously using CGM, document, and provide at future appointments check blood pressure daily, document, and provide at future appointments Communicate with specialty providers         Patient verbalizes understanding of instructions provided today and agrees to view in [place].    Plan: Telephone follow up appointment with care management team member scheduled for:  ~ 8 weeks  Catie Darnelle Maffucci, PharmD, [place], [place] Clinical Pharmacist [employer] [facility] [phone]

## 2020-07-26 NOTE — Chronic Care Management (AMB) (Signed)
Care Management   Pharmacy Note  07/26/2020 Name: STEPHAUN MILLION MRN: [id number] DOB: 02/25/1966  Subjective: KENDEN BRANDT is a 55 y.o. year old male who is a primary care patient of [name], [name], MD. The Care Management team was consulted for assistance with care management and care coordination needs.    Engaged with patient's wife, [name] (as approved by patient, on DPR) for follow up visit in response to provider referral for pharmacy case management and/or care coordination services.   The patient was given information about Care Management services today including:  1. Care Management services includes personalized support from designated clinical staff supervised by the patient's primary care provider, including individualized plan of care and coordination with other care providers. 2. 24/7 contact phone numbers for assistance for urgent and routine care needs. 3. The patient may stop case management services at any time by phone call to the office staff.  Patient agreed to services and consent obtained.  Assessment:  Review of patient status, including review of consultants reports, laboratory and other test data, was performed as part of comprehensive evaluation and provision of chronic care management services.   SDOH (Social Determinants of Health) assessments and interventions performed:  SDOH Interventions   Flowsheet Row Most Recent Value  SDOH Interventions   Financial Strain Interventions Intervention Not Indicated       Objective:  Lab Results  Component Value Date   CREATININE 4.86 (H) 06/30/2020   CREATININE 5.10 (H) 06/30/2020   CREATININE 4.83 (H) 06/29/2020    Lab Results  Component Value Date   HGBA1C 7.0 (H) 06/05/2020       Component Value Date/Time   CHOL 119 06/05/2020 0838   TRIG 274.0 (H) 06/05/2020 0838   HDL 33.80 (L) 06/05/2020 0838   CHOLHDL 4 06/05/2020 0838   VLDL 54.8 (H) 06/05/2020 0838   LDLCALC 55 05/24/2019 0905    LDLDIRECT 43.0 06/05/2020 0838   Lab Results  Component Value Date   TSH 3.13 12/02/2017   Last vitamin D Lab Results  Component Value Date   VD25OH 10.67 (L) 06/05/2020     Clinical ASCVD: Yes  The ASCVD Risk score Mikey Bussing DC Jr., et al., 2013) failed to calculate for the following reasons:   The patient has a prior MI or stroke diagnosis    BP Readings from Last 3 Encounters:  06/30/20 (!) 147/91  06/05/20 140/80  05/21/20 (!) 151/80    Care Plan  No Active Allergies  Medications Reviewed Today    Reviewed by De Hollingshead, RPH-CPP (Pharmacist) on 07/26/20 at 1039  Med List Status: <None>  Medication Order Taking? Sig Documenting Provider Last Dose Status Informant  acetaminophen (TYLENOL) 325 MG tablet [id number] No Take 1 tablet (325 mg total) by mouth every 6 (six) hours as needed for mild pain (pain score 1-3 or temp > 100.5).  Patient not taking: Reported on 07/26/2020   Grier Mitts, PA-C Not Taking Active Spouse/Significant Other  aspirin (ASPIRIN CHILDRENS) 81 MG chewable tablet [id number] Yes Chew 1 tablet (81 mg total) by mouth daily. Lajuana Matte, MD Taking Active Spouse/Significant Other  atorvastatin (LIPITOR) 80 MG tablet [id number] Yes TAKE 1 TABLET BY MOUTH EVERY DAY [name], MD Taking Active Spouse/Significant Other  brimonidine-timolol (COMBIGAN) 0.2-0.5 % ophthalmic solution [id number] Yes Place 1 drop into the right eye every 12 (twelve) hours.  [provider] Taking Active Spouse/Significant Other           Med Note (  Tamsen Snider   Mon Feb 14, 2019  9:49 AM)    carvedilol (COREG) 25 MG tablet [id number] Yes TAKE 1 TABLET (25 MG TOTAL) BY MOUTH 2 (TWO) TIMES DAILY WITH A MEAL. [name], MD Taking Active   cholecalciferol (VITAMIN D3) 25 MCG (1000 UT) tablet [id number] Yes Take 1,000 Units by mouth daily. [provider] Taking Active Spouse/Significant Other  cloNIDine (CATAPRES - DOSED IN MG/24  HR) 0.1 mg/24hr patch [id number] Yes Place 1 patch (0.1 mg total) onto the skin once a week. [name], MD Taking Active   Continuous Blood Gluc Sensor (DEXCOM G6 SENSOR) MISC [id number] Yes Inject 1 Device into the skin as directed. Place a new sensor every 10 days to check blood sugar at least 4 times daily Shamleffer, Melanie Crazier, MD Taking Active Spouse/Significant Other  Continuous Blood Gluc Transmit (DEXCOM G6 TRANSMITTER) MISC [id number] Yes Inject 1 Device into the skin as directed. Use to check blood sugar at least 4 times daily Shamleffer, Melanie Crazier, MD Taking Active Spouse/Significant Other  dorzolamide (TRUSOPT) 2 % ophthalmic solution [id number] Yes 1 drop 2 (two) times daily. [provider] Taking Active Spouse/Significant Other  esomeprazole (NEXIUM) 20 MG capsule [id number] Yes Take 20 mg by mouth daily at 12 noon. [provider] Taking Active Spouse/Significant Other  ezetimibe (ZETIA) 10 MG tablet [id number] Yes Take 1 tablet (10 mg total) by mouth daily. [name], MD Taking Active Spouse/Significant Other  fluticasone (FLONASE) 50 MCG/ACT nasal spray [id number] Yes SPRAY 2 SPRAYS INTO EACH NOSTRIL EVERY DAY  Patient taking differently: Place 1 spray into both nostrils daily as needed for allergies.   [name], MD Taking Active Spouse/Significant Other  gabapentin (NEURONTIN) 300 MG capsule [id number] Yes Take 1 capsule (300 mg total) by mouth 2 (two) times daily. [name], MD Taking Active Spouse/Significant Other  hydrALAZINE (APRESOLINE) 25 MG tablet [id number] Yes Take 12.5 mg by mouth in the morning and at bedtime. [provider] Taking Active Spouse/Significant Other           Med Note Darnelle Maffucci, Emir Nack E   Thu Jul 26, 2020 10:36 AM) Holding if SBP <140  insulin degludec (TRESIBA FLEXTOUCH) 200 UNIT/ML FlexTouch Pen [id number] No Inject 40 Units into the skin 2 (two) times daily.  Patient not taking: No sig  reported   Shamleffer, Melanie Crazier, MD Not Taking Active Spouse/Significant Other  Insulin Glargine (BASAGLAR KWIKPEN) 100 UNIT/ML [id number] Yes Inject 40 Units into the skin 2 (two) times daily. Further refills to come from endocrinology Shamleffer, Melanie Crazier, MD Taking Active Spouse/Significant Other  insulin lispro (HUMALOG KWIKPEN) 100 UNIT/ML KwikPen [id number] Yes Inject 6 Units into the skin 3 (three) times daily. Inject up to 15 units TID with meals. Further refills to come from endocrinology Shamleffer, Melanie Crazier, MD Taking Active Spouse/Significant Other           Med Note De Hollingshead   Thu Apr 12, 2020  9:20 AM) 6-8 units with meals   Insulin Pen Needle 32G X 4 MM MISC [id number] Yes Inject 5 pens into the skin daily. Use to inject Lantus and Novolog up to 5 times daily Shamleffer, Melanie Crazier, MD Taking Active Spouse/Significant Other  Lactulose 20 GM/30ML SOLN [id number] Yes Take 30 mLs (20 g total) by mouth as needed. Lajuana Matte, MD Taking Active Spouse/Significant Other  latanoprost (XALATAN) 0.005 % ophthalmic solution [id number] Yes Place 1 drop into both eyes at bedtime. [provider] Taking Active Spouse/Significant Other           Med Note Chanetta Marshall Feb 14, 2019  9:49 AM)    lisinopril (ZESTRIL) 5 MG tablet [id number] Yes Take 1 tablet (5 mg total) by mouth daily. [name], MD Taking Active Spouse/Significant Other  metoCLOPramide (REGLAN) 10 MG tablet [id number] Yes TAKE 1 TABLET BY MOUTH EVERYDAY AT BEDTIME Pyrtle, Lajuan Lines, MD Taking Active   Multiple Vitamin (MULTIVITAMIN) tablet [id number] Yes Take 1 tablet by mouth daily. [provider] Taking Active Spouse/Significant Other  senna (SENOKOT) 8.6 MG tablet [id number] Yes Take 2 tablets by mouth daily as needed for constipation. [provider] Taking Active Spouse/Significant Other  torsemide (DEMADEX) 20 MG tablet [id number] Yes Take 20 mg by  mouth daily. [provider] Taking Active Spouse/Significant Other          Patient Active Problem List   Diagnosis Date Noted  . Leg wound, right 06/07/2020  . Chronic headaches 06/07/2020  . Colon polyps 06/07/2020  . Bloating 03/02/2020  . 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 Type 1 diabetes mellitus with stage 4 chronic kidney disease ([place]) 06/30/2019  . Type 1 diabetes mellitus with diabetic polyneuropathy ([place]) 06/30/2019  . Diabetes mellitus type I ([place]) 06/30/2019  . Left below-knee amputee ([place]) 06/30/2019  . Swelling of limb 06/07/2019  . Vision changes 02/22/2019  . 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 Nephrotic syndrome 01/19/2019  . Allergic rhinitis 07/19/2018  . Amputation of left lower extremity below knee ([place]) 09/09/2017  . Benign prostatic hyperplasia   . Hyperlipidemia   . Chronic kidney disease (CKD), stage IV (severe) ([place])   . Neuropathic pain   . Coronary artery disease involving native coronary artery of native heart without angina pectoris   . Anemia of chronic disease   . Cervical spondylosis with myelopathy and radiculopathy 05/07/2017  . Fatty liver 04/04/2017  . DDD (degenerative disc disease), cervical 01/13/2017  . PAD (peripheral artery disease) ([place]) 08/07/2016  . Risk for falls 07/28/2016  . Diabetic Charcot foot ([place]) 07/21/2016  . Nondisplaced fracture of left calcaneus 07/10/2016  . Wound infection 07/10/2016  . Acute osteomyelitis of right foot ([place]) 07/03/2016  . Diabetic peripheral neuropathy ([place]) 07/03/2016  . Chronic diarrhea 05/17/2016  . Peripheral vascular disease of extremity with claudication ([place]) 09/12/2015  . Anemia 01/12/2014  . Disorder of ligament of ankle 07/31/2013  . Edema 06/28/2013  . 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 Obesity (BMI 30-39.9) 06/12/2013  . Pain in joint, shoulder region 06/12/2013  . Statin intolerance 06/10/2013  . GILBERT'S  SYNDROME 07/17/2006  . Essential hypertension 07/17/2006  . Coronary atherosclerosis 07/17/2006  . OSTEOARTHRITIS 07/17/2006    Conditions to be addressed/monitored: CAD, HTN, HLD, DMII and CKD  Care Plan : Medication Management  Updates made by De Hollingshead, RPH-CPP since 07/26/2020 12:00 AM    Problem: Diabetes, CAD (hx CABG, HTN), CKD     Long-Range Goal: Disease Progression Prevention   This Visit's Progress: On track  Recent Progress: On track  Priority: High  Note:   Current Barriers:  . Unable to achieve control of diabetes, hypertension   Pharmacist Clinical Goal(s):  Marland Kitchen Over the next 90 days, patient will achieve adherence to monitoring guidelines and medication adherence to achieve therapeutic efficacy through collaboration with PharmD and provider.    Interventions: . 1:1 collaboration with [name],  MD regarding development and update of comprehensive plan of care as evidenced by provider attestation and co-signature . Inter-disciplinary care team collaboration (see longitudinal plan of care) . Comprehensive medication review performed; medication list updated in electronic medical record  Diabetes, treated as T1: . Controlled; current treatment: Basaglar 40 units BID, Novolog per sliding scale, 6-10 units with meals . Per insurance, switching to Tresiba 40 units BID. Terra to pick up today.  . Received call from [facility] regarding a script for Tyler Aas that was sent to the pharmacy.  o Hx significant GI upset, occular migraines w/ Ozempic o Renal fx limits metformin, SGLT2 . Current glucose readings: notes throughout the day in 100-150s, infrequent alarms for glucose <230 . Denies episodes of hypoglycemia . Reviewed similarities and differences between [place]. Terra verbalized understanding . Continue current regimen along with endocrinology collaboration at this time.  Hypertension with Advanced CKD: . Uncontrolled but improved; current  treatment: lisinopril 5 mg daily, hydralazine 12.5 mg BID - holding if SBP <140, carvedilol 25 mg BID, torsemide 20 mg daily; clonidine 0.1 mg patch o LEE w/ amlodipine . Current home BP readings: generally 100-140/80 . Notes that patient had not rested for 5 minutes prior to cardiology appt where BP was documented at 160/100 . Notes some sleepiness since starting clonidine, but improving with time.  Marland Kitchen Referral for vascular surgery for PD catheter discussion, referral to transplant for renal/pancreas transplant evaluation . Continue current regimen at this time along with collaboration with cardiology and nephrology.  Hyperlipidemia: . Controlled; current treatment: atorvastatin 80 mg daily, ezetimibe 10 mg daily . Current antiplatelet regimen: aspirin 81 mg daily . Recommend to continue current regimen at this time  Allergies: . Controlled; current using fluticasone nasal spray PRN . Recommend to continue current regimen at this time.   GI (GERD hx Barrett's, gastroparesis, family hx NASH): . Appropriately managed per current regimen: omeprazole 20 mg daily for GERD; senna daily for constipation . Recommend to continue current regimen with dietary modifications at this time. Follow up w/ Dr. Hilarie Fredrickson next month as schedule  Supplements: Vitamin D   Patient Goals/Self-Care Activities . Over the next 90 days, patient will:  - take medications as prescribed check glucose at least at least three times daily using CGM, document, and provide at future appointments check blood pressure daily, document, and provide at future appointments Communicate with specialty providers  Follow Up Plan: Telephone follow up appointment with care management team member scheduled for: ~ 8 weeks     Medication Assistance:  None required.  Patient affirms current coverage meets needs.  Follow Up:  Patient agrees to Care Plan and Follow-up.  Plan: Telephone follow up appointment with care management team  member scheduled for:  ~ 8 weeks  Catie Darnelle Maffucci, PharmD, [place], [place] Clinical Pharmacist [employer] at [facility] [phone]

## 2020-07-30 ENCOUNTER — Ambulatory Visit: Payer: No Typology Code available for payment source | Admitting: Surgery

## 2020-08-03 ENCOUNTER — Telehealth: Payer: Self-pay

## 2020-08-03 ENCOUNTER — Encounter: Payer: Self-pay | Admitting: Surgery

## 2020-08-03 ENCOUNTER — Other Ambulatory Visit: Payer: Self-pay

## 2020-08-03 ENCOUNTER — Ambulatory Visit (INDEPENDENT_AMBULATORY_CARE_PROVIDER_SITE_OTHER): Payer: No Typology Code available for payment source | Admitting: Surgery

## 2020-08-03 VITALS — BP 156/96 | HR 67 | Temp 98.1°F | Ht 70.5 in | Wt 238.4 lb

## 2020-08-03 DIAGNOSIS — N185 Chronic kidney disease, stage 5: Secondary | ICD-10-CM | POA: Diagnosis not present

## 2020-08-03 NOTE — Patient Instructions (Addendum)
Our surgery scheduler will call you within 24-48 hours to schedule your surgery. Please have the [place] surgery sheet available when speaking with her.   Stop your Aspirin 08/22/20.   Cardiac Clearance is faxed to Dr.Kenneth Fath.

## 2020-08-03 NOTE — Progress Notes (Signed)
08/03/2020  Reason for Visit:  Evaluation for Peritoneal Dialysis  Referring Provider:  Murlean Iba, MD  History of Present Illness: Chase Clark is a 55 y.o. male presenting for evaluation for peritoneal dialysis catheter placement.  The patient has a history of chronic kidney disease stage  V and has been followed by Dr. Candiss Norse.  He also has a history of CABG in 2020, DM1, L BKA in 2019, and HTN.  His CKD continues to progress, and he's approaching the need for dialysis.  After discussing the different modalities with Dr. Candiss Norse, he has opted for peritoneal dialysis. He reports lower extremity edema, headaches.  Has had a laparoscopic cholecystectomy in the past as his only abdominal surgery.  Denies feeling any bulging sensation in the prior incisions or in the groin areas.  Past Medical History: Past Medical History:  Diagnosis Date  . Arthritis   . Asthma    as child  . Barrett's esophagus 02/16/2014   short segment  . Cataract   . Chronic kidney disease (CKD), stage IV (severe) ([place])   . Coronary artery disease    DES DIAG1 11/20/09 [facility])  . DDD (degenerative disc disease), cervical   . Diabetes mellitus without complication ([place])   . Diabetic osteomyelitis ([place])   . Diabetic peripheral neuropathy ([place])   . Diabetic ulcer of foot with muscle involvement without evidence of necrosis ([place])    DIABETIC ULCERATIONS ASSOCIATED WITH IRRITATION LATERAL ANKLE LEFT GREATER THAN RIGHT WITH MILD CELLULITIS   . Diverticulosis   . DKA (diabetic ketoacidoses) 08/23/2017  . Edema, lower extremity   . Fatty liver   . Gastroparesis 2015  . GERD (gastroesophageal reflux disease)   . Gilbert's disease   . Glaucoma   . Heart disease 2011   Patient has stent for 80% blockage  . Hyperlipidemia   . Hypertension   . Left below-knee amputee ([place])   . Legally blind   . Myocardial infarction ([place])   . Neuropathy   . Osteoarthritis of spine   . Osteomyelitis of left foot ([place])   . PVD  (peripheral vascular disease) ([place])   . Seasonal allergies   . Shoulder pain   . Ulcer of foot ([place]) 08.06.2014   DIABETIC ULCERATIONS ASSOCIATED WITH IRRITATION LATERAL ANKLE LEFT GREATER THAN RIGHT WITH MILD CELLULITIS     Past Surgical History: Past Surgical History:  Procedure Laterality Date  . ABDOMINAL AORTOGRAM N/A 05/20/2016   Procedure: Abdominal Aortogram possible intervention;  Surgeon: Katha Cabal, MD;  Location: [place] [facility];  Service: Cardiovascular;  Laterality: N/A;  . AMPUTATION Left 09/05/2017   Procedure: AMPUTATION BELOW KNEE;  Surgeon: Tania Ade, MD;  Location: [place];  Service: Orthopedics;  Laterality: Left;  . ANTERIOR CERVICAL DECOMP/DISCECTOMY FUSION N/A 05/07/2017   Procedure: ANTERIOR CERVICAL DECOMPRESSION/DISCECTOMY Luevenia Maxin PROSTHESIS,PLATE/SCREWS CERVICAL FIVE - CERVICAL SIX;  Surgeon: Newman Pies, MD;  Location: [place];  Service: Neurosurgery;  Laterality: N/A;  . CARDIAC CATHETERIZATION    . CATARACT EXTRACTION W/ INTRAOCULAR LENS IMPLANT    . CATARACT EXTRACTION W/PHACO Left 02/26/2017   Procedure: CATARACT EXTRACTION PHACO AND INTRAOCULAR LENS PLACEMENT (IOC);  Surgeon: Eulogio Bear, MD;  Location: [facility];  Service: Ophthalmology;  Laterality: Left;  Lot # [id number] H [place]: 00:25.3 AP%: 6.2 CDE: 1.59   . CHOLECYSTECTOMY N/A   . CORONARY ANGIOPLASTY WITH STENT PLACEMENT  2007   1st diagonal  . CORONARY ARTERY BYPASS GRAFT N/A 01/27/2019   Procedure: OFF PUMP CORONARY ARTERY BYPASS  GRAFTING (CABG) X 2 WITH ENDOSCOPIC HARVESTING OF RIGHT GREATER SAPHENOUS VEIN. LIMA TO LAD;  Surgeon: Lajuana Matte, MD;  Location: [place];  Service: Open Heart Surgery;  Laterality: N/A;  . CORONARY STENT INTERVENTION N/A 01/21/2019   Procedure: CORONARY STENT INTERVENTION;  Surgeon: Belva Crome, MD;  Location: [place] [facility];  Service: Cardiovascular;  Laterality: N/A;  . EPIBLEPHERON REPAIR WITH TEAR DUCT PROBING    . EYE  SURGERY Right sept 29n2015   cataract extraction  . INSERTION EXPRESS TUBE SHUNT Right 09/06/2015   Procedure: INSERTION AHMED TUBE SHUNT with tutoplast allograft;  Surgeon: Eulogio Bear, MD;  Location: [facility];  Service: Ophthalmology;  Laterality: Right;  . LEFT HEART CATH AND CORONARY ANGIOGRAPHY N/A 01/20/2019   Procedure: LEFT HEART CATH AND CORONARY ANGIOGRAPHY;  Surgeon: Lorretta Harp, MD;  Location: [place] [facility];  Service: Cardiovascular;  Laterality: N/A;  . TEE WITHOUT CARDIOVERSION N/A 01/27/2019   Procedure: TRANSESOPHAGEAL ECHOCARDIOGRAM (TEE);  Surgeon: Lajuana Matte, MD;  Location: [place];  Service: Open Heart Surgery;  Laterality: N/A;  . VASECTOMY      Home Medications: Prior to Admission medications   Medication Sig Start Date End Date Taking? Authorizing Provider  acetaminophen (TYLENOL) 325 MG tablet Take 1 tablet (325 mg total) by mouth every 6 (six) hours as needed for mild pain (pain score 1-3 or temp > 100.5). 09/06/17  Yes Grier Mitts, PA-C  aspirin (ASPIRIN CHILDRENS) 81 MG chewable tablet Chew 1 tablet (81 mg total) by mouth daily. 03/04/19  Yes Lightfoot, Lucile Crater, MD  atorvastatin (LIPITOR) 80 MG tablet TAKE 1 TABLET BY MOUTH EVERY DAY 12/01/19  Yes [name], MD  brimonidine-timolol (COMBIGAN) 0.2-0.5 % ophthalmic solution Place 1 drop into the right eye every 12 (twelve) hours.    Yes [provider]  carvedilol (COREG) 25 MG tablet TAKE 1 TABLET (25 MG TOTAL) BY MOUTH 2 (TWO) TIMES DAILY WITH A MEAL. 07/25/20  Yes [name], MD  cholecalciferol (VITAMIN D3) 25 MCG (1000 UT) tablet Take 1,000 Units by mouth daily.   Yes [provider]  cloNIDine (CATAPRES - DOSED IN MG/24 HR) 0.1 mg/24hr patch Place 1 patch (0.1 mg total) onto the skin once a week. 07/04/20  Yes [name], MD  Continuous Blood Gluc Sensor (DEXCOM G6 SENSOR) MISC Inject 1 Device into the skin as directed. Place a new sensor every  10 days to check blood sugar at least 4 times daily 02/22/20  Yes Shamleffer, Melanie Crazier, MD  Continuous Blood Gluc Transmit (DEXCOM G6 TRANSMITTER) MISC Inject 1 Device into the skin as directed. Use to check blood sugar at least 4 times daily 02/22/20  Yes Shamleffer, Melanie Crazier, MD  dorzolamide (TRUSOPT) 2 % ophthalmic solution 1 drop 2 (two) times daily. 10/18/19  Yes [provider]  esomeprazole (NEXIUM) 20 MG capsule Take 20 mg by mouth daily at 12 noon.   Yes [provider]  ezetimibe (ZETIA) 10 MG tablet Take 1 tablet (10 mg total) by mouth daily. 03/22/20  Yes [name], MD  fluticasone (FLONASE) 50 MCG/ACT nasal spray SPRAY 2 SPRAYS INTO EACH NOSTRIL EVERY DAY Patient taking differently: Place 1 spray into both nostrils daily as needed for allergies. 11/14/19  Yes [name], MD  gabapentin (NEURONTIN) 300 MG capsule Take 1 capsule (300 mg total) by mouth 2 (two) times daily. 01/19/20  Yes [name], MD  hydrALAZINE (APRESOLINE) 25 MG  tablet Take 12.5 mg by mouth in the morning and at bedtime. 01/26/20  Yes [provider]  insulin degludec (TRESIBA FLEXTOUCH) 200 UNIT/ML FlexTouch Pen Inject 40 Units into the skin 2 (two) times daily. 06/18/20  Yes Shamleffer, Melanie Crazier, MD  insulin lispro (HUMALOG KWIKPEN) 100 UNIT/ML KwikPen Inject 6 Units into the skin 3 (three) times daily. Inject up to 15 units TID with meals. Further refills to come from endocrinology 02/22/20  Yes Shamleffer, Melanie Crazier, MD  Insulin Pen Needle 32G X 4 MM MISC Inject 5 pens into the skin daily. Use to inject Lantus and Novolog up to 5 times daily 02/22/20  Yes Shamleffer, Melanie Crazier, MD  Lactulose 20 GM/30ML SOLN Take 30 mLs (20 g total) by mouth as needed. 03/04/19  Yes Lightfoot, Lucile Crater, MD  latanoprost (XALATAN) 0.005 % ophthalmic solution Place 1 drop into both eyes at bedtime. 03/03/17  Yes [provider]  lisinopril (ZESTRIL) 5  MG tablet Take 1 tablet (5 mg total) by mouth daily. 03/23/20  Yes [name], MD  metoCLOPramide (REGLAN) 10 MG tablet TAKE 1 TABLET BY MOUTH EVERYDAY AT BEDTIME 07/25/20  Yes Pyrtle, Lajuan Lines, MD  Multiple Vitamin (MULTIVITAMIN) tablet Take 1 tablet by mouth daily.   Yes [provider]  senna (SENOKOT) 8.6 MG tablet Take 2 tablets by mouth daily as needed for constipation.   Yes [provider]  torsemide (DEMADEX) 20 MG tablet Take 20 mg by mouth daily. 03/07/20 03/07/21 Yes [provider]    Allergies: No Active Allergies  Social History:  reports that he quit smoking about 12 years ago. His smoking use included cigarettes. He has never used smokeless tobacco. He reports current alcohol use. He reports that he does not use drugs.   Family History: Family History  Problem Relation Age of Onset  . Hyperlipidemia Mother   . Diabetes Mother   . Liver disease Mother        NASH  . Other Mother        immune thrombocytopenic pupura (ITP)  . Hyperlipidemia Father   . Diabetes Father   . Heart disease Father   . Hypertension Father   . Lymphoma Father   . Hemochromatosis Other        Grandmother (? which side)  . Polycythemia Other   . Colon cancer Neg Hx   . Esophageal cancer Neg Hx   . Rectal cancer Neg Hx   . Stomach cancer Neg Hx     Review of Systems: Review of Systems  Constitutional: Negative for chills and fever.  HENT: Negative for hearing loss.   Respiratory: Negative for shortness of breath.   Cardiovascular: Positive for leg swelling. Negative for chest pain.  Gastrointestinal: Negative for abdominal pain, nausea and vomiting.  Genitourinary: Negative for dysuria.  Musculoskeletal: Negative for myalgias.  Skin: Negative for rash.  Neurological: Positive for headaches. Negative for dizziness.  Psychiatric/Behavioral: Negative for depression.    Physical Exam BP (!) 156/96   Pulse 67   Temp 98.1 F (36.7 C) (Oral)   Ht 5'  10.5" (1.791 m)   Wt 238 lb 6.4 oz (108.1 kg)   SpO2 98%   BMI 33.72 kg/m  CONSTITUTIONAL: No acute distress. HEENT:  Normocephalic, atraumatic, extraocular motion intact. NECK: Trachea is midline, and there is no jugular venous distension.  RESPIRATORY:  Lungs are clear, and breath sounds are equal bilaterally. Normal respiratory effort without pathologic use of accessory muscles. CARDIOVASCULAR: Heart is  regular without murmurs, gallops, or rubs. GI: The abdomen is soft, non-distended, non-tender to palpation.  No evidence of abdominal hernia.  Prior laparoscopic incisions well healed.  MUSCULOSKELETAL:  Normal muscle strength and tone in all four extremities. SKIN: Skin turgor is normal. There are no pathologic skin lesions.  NEUROLOGIC:  Motor and sensation is grossly normal.  Cranial nerves are grossly intact. PSYCH:  Alert and oriented to person, place and time. Affect is normal.  Laboratory Analysis: Labs from 06/30/20: Na 137, K 4.0, Cl 107, CO2 22, BUN 36, Cr 4.86, Ca 8.5, GFR 13.  WBC 7.4, Hgb 10.1, Hct 30.9, Plt 187.  Imaging: No results found.  Assessment and Plan: This is a 55 y.o. male with chronic kidney disease, approaching need for dialysis.  --Discussed with the patient the role for PD catheter placement to help with his dialysis needs.  He does not have any hernias on exam, and he has only had one intra-abdominal laparoscopic surgery before.  He would be a good candidate for PD catheter placement.  Reviewed with him the surgery at length and discussed with him the risks of bleeding, infection, and injury to surrounding structures.  He is willing to proceed. --Based on his schedule, will schedule him for laparoscopic assisted PD catheter placement on 08/28/20.  He has been vaccinated for COVID-19 so would not need preop testing at this time.  However, given his CABG and BKA history, would need cardiology clearance.  He is currently on Aspirin, and based on scheduling, his  last dose would be 08/22/20.  Face-to-face time spent with the patient and care providers was 80 minutes, with more than 50% of the time spent counseling, educating, and coordinating care of the patient.     Melvyn Neth, [facility]

## 2020-08-03 NOTE — Telephone Encounter (Signed)
Cardiac Clearance faxed to Dr.kenneth fath at this time- [phone]

## 2020-08-03 NOTE — H&P (View-Only) (Signed)
08/03/2020  Reason for Visit:  Evaluation for Peritoneal Dialysis  Referring Provider:  Murlean Iba, MD  History of Present Illness: Chase Clark is a 55 y.o. male presenting for evaluation for peritoneal dialysis catheter placement.  The patient has a history of chronic kidney disease stage  V and has been followed by Dr. Candiss Norse.  He also has a history of CABG in 2020, DM1, L BKA in 2019, and HTN.  His CKD continues to progress, and he's approaching the need for dialysis.  After discussing the different modalities with Dr. Candiss Norse, he has opted for peritoneal dialysis. He reports lower extremity edema, headaches.  Has had a laparoscopic cholecystectomy in the past as his only abdominal surgery.  Denies feeling any bulging sensation in the prior incisions or in the groin areas.  Past Medical History: Past Medical History:  Diagnosis Date  . Arthritis   . Asthma    as child  . Barrett's esophagus 02/16/2014   short segment  . Cataract   . Chronic kidney disease (CKD), stage IV (severe) ([place])   . Coronary artery disease    DES DIAG1 11/20/09 [facility])  . DDD (degenerative disc disease), cervical   . Diabetes mellitus without complication ([place])   . Diabetic osteomyelitis ([place])   . Diabetic peripheral neuropathy ([place])   . Diabetic ulcer of foot with muscle involvement without evidence of necrosis ([place])    DIABETIC ULCERATIONS ASSOCIATED WITH IRRITATION LATERAL ANKLE LEFT GREATER THAN RIGHT WITH MILD CELLULITIS   . Diverticulosis   . DKA (diabetic ketoacidoses) 08/23/2017  . Edema, lower extremity   . Fatty liver   . Gastroparesis 2015  . GERD (gastroesophageal reflux disease)   . Gilbert's disease   . Glaucoma   . Heart disease 2011   Patient has stent for 80% blockage  . Hyperlipidemia   . Hypertension   . Left below-knee amputee ([place])   . Legally blind   . Myocardial infarction ([place])   . Neuropathy   . Osteoarthritis of spine   . Osteomyelitis of left foot ([place])   . PVD  (peripheral vascular disease) ([place])   . Seasonal allergies   . Shoulder pain   . Ulcer of foot ([place]) 08.06.2014   DIABETIC ULCERATIONS ASSOCIATED WITH IRRITATION LATERAL ANKLE LEFT GREATER THAN RIGHT WITH MILD CELLULITIS     Past Surgical History: Past Surgical History:  Procedure Laterality Date  . ABDOMINAL AORTOGRAM N/A 05/20/2016   Procedure: Abdominal Aortogram possible intervention;  Surgeon: Katha Cabal, MD;  Location: [place] [facility];  Service: Cardiovascular;  Laterality: N/A;  . AMPUTATION Left 09/05/2017   Procedure: AMPUTATION BELOW KNEE;  Surgeon: Tania Ade, MD;  Location: [place];  Service: Orthopedics;  Laterality: Left;  . ANTERIOR CERVICAL DECOMP/DISCECTOMY FUSION N/A 05/07/2017   Procedure: ANTERIOR CERVICAL DECOMPRESSION/DISCECTOMY Luevenia Maxin PROSTHESIS,PLATE/SCREWS CERVICAL FIVE - CERVICAL SIX;  Surgeon: Newman Pies, MD;  Location: [place];  Service: Neurosurgery;  Laterality: N/A;  . CARDIAC CATHETERIZATION    . CATARACT EXTRACTION W/ INTRAOCULAR LENS IMPLANT    . CATARACT EXTRACTION W/PHACO Left 02/26/2017   Procedure: CATARACT EXTRACTION PHACO AND INTRAOCULAR LENS PLACEMENT (IOC);  Surgeon: Eulogio Bear, MD;  Location: [facility];  Service: Ophthalmology;  Laterality: Left;  Lot # [id number] H [place]: 00:25.3 AP%: 6.2 CDE: 1.59   . CHOLECYSTECTOMY N/A   . CORONARY ANGIOPLASTY WITH STENT PLACEMENT  2007   1st diagonal  . CORONARY ARTERY BYPASS GRAFT N/A 01/27/2019   Procedure: OFF PUMP CORONARY ARTERY BYPASS  GRAFTING (CABG) X 2 WITH ENDOSCOPIC HARVESTING OF RIGHT GREATER SAPHENOUS VEIN. LIMA TO LAD;  Surgeon: Lajuana Matte, MD;  Location: [place];  Service: Open Heart Surgery;  Laterality: N/A;  . CORONARY STENT INTERVENTION N/A 01/21/2019   Procedure: CORONARY STENT INTERVENTION;  Surgeon: Belva Crome, MD;  Location: [place] [facility];  Service: Cardiovascular;  Laterality: N/A;  . EPIBLEPHERON REPAIR WITH TEAR DUCT PROBING    . EYE  SURGERY Right sept 29n2015   cataract extraction  . INSERTION EXPRESS TUBE SHUNT Right 09/06/2015   Procedure: INSERTION AHMED TUBE SHUNT with tutoplast allograft;  Surgeon: Eulogio Bear, MD;  Location: [facility];  Service: Ophthalmology;  Laterality: Right;  . LEFT HEART CATH AND CORONARY ANGIOGRAPHY N/A 01/20/2019   Procedure: LEFT HEART CATH AND CORONARY ANGIOGRAPHY;  Surgeon: Lorretta Harp, MD;  Location: [place] [facility];  Service: Cardiovascular;  Laterality: N/A;  . TEE WITHOUT CARDIOVERSION N/A 01/27/2019   Procedure: TRANSESOPHAGEAL ECHOCARDIOGRAM (TEE);  Surgeon: Lajuana Matte, MD;  Location: [place];  Service: Open Heart Surgery;  Laterality: N/A;  . VASECTOMY      Home Medications: Prior to Admission medications   Medication Sig Start Date End Date Taking? Authorizing Provider  acetaminophen (TYLENOL) 325 MG tablet Take 1 tablet (325 mg total) by mouth every 6 (six) hours as needed for mild pain (pain score 1-3 or temp > 100.5). 09/06/17  Yes Grier Mitts, PA-C  aspirin (ASPIRIN CHILDRENS) 81 MG chewable tablet Chew 1 tablet (81 mg total) by mouth daily. 03/04/19  Yes Lightfoot, Lucile Crater, MD  atorvastatin (LIPITOR) 80 MG tablet TAKE 1 TABLET BY MOUTH EVERY DAY 12/01/19  Yes [name], MD  brimonidine-timolol (COMBIGAN) 0.2-0.5 % ophthalmic solution Place 1 drop into the right eye every 12 (twelve) hours.    Yes [provider]  carvedilol (COREG) 25 MG tablet TAKE 1 TABLET (25 MG TOTAL) BY MOUTH 2 (TWO) TIMES DAILY WITH A MEAL. 07/25/20  Yes [name], MD  cholecalciferol (VITAMIN D3) 25 MCG (1000 UT) tablet Take 1,000 Units by mouth daily.   Yes [provider]  cloNIDine (CATAPRES - DOSED IN MG/24 HR) 0.1 mg/24hr patch Place 1 patch (0.1 mg total) onto the skin once a week. 07/04/20  Yes [name], MD  Continuous Blood Gluc Sensor (DEXCOM G6 SENSOR) MISC Inject 1 Device into the skin as directed. Place a new sensor every  10 days to check blood sugar at least 4 times daily 02/22/20  Yes Shamleffer, Melanie Crazier, MD  Continuous Blood Gluc Transmit (DEXCOM G6 TRANSMITTER) MISC Inject 1 Device into the skin as directed. Use to check blood sugar at least 4 times daily 02/22/20  Yes Shamleffer, Melanie Crazier, MD  dorzolamide (TRUSOPT) 2 % ophthalmic solution 1 drop 2 (two) times daily. 10/18/19  Yes [provider]  esomeprazole (NEXIUM) 20 MG capsule Take 20 mg by mouth daily at 12 noon.   Yes [provider]  ezetimibe (ZETIA) 10 MG tablet Take 1 tablet (10 mg total) by mouth daily. 03/22/20  Yes [name], MD  fluticasone (FLONASE) 50 MCG/ACT nasal spray SPRAY 2 SPRAYS INTO EACH NOSTRIL EVERY DAY Patient taking differently: Place 1 spray into both nostrils daily as needed for allergies. 11/14/19  Yes [name], MD  gabapentin (NEURONTIN) 300 MG capsule Take 1 capsule (300 mg total) by mouth 2 (two) times daily. 01/19/20  Yes [name], MD  hydrALAZINE (APRESOLINE) 25 MG  tablet Take 12.5 mg by mouth in the morning and at bedtime. 01/26/20  Yes [provider]  insulin degludec (TRESIBA FLEXTOUCH) 200 UNIT/ML FlexTouch Pen Inject 40 Units into the skin 2 (two) times daily. 06/18/20  Yes Shamleffer, Melanie Crazier, MD  insulin lispro (HUMALOG KWIKPEN) 100 UNIT/ML KwikPen Inject 6 Units into the skin 3 (three) times daily. Inject up to 15 units TID with meals. Further refills to come from endocrinology 02/22/20  Yes Shamleffer, Melanie Crazier, MD  Insulin Pen Needle 32G X 4 MM MISC Inject 5 pens into the skin daily. Use to inject Lantus and Novolog up to 5 times daily 02/22/20  Yes Shamleffer, Melanie Crazier, MD  Lactulose 20 GM/30ML SOLN Take 30 mLs (20 g total) by mouth as needed. 03/04/19  Yes Lightfoot, Lucile Crater, MD  latanoprost (XALATAN) 0.005 % ophthalmic solution Place 1 drop into both eyes at bedtime. 03/03/17  Yes [provider]  lisinopril (ZESTRIL) 5  MG tablet Take 1 tablet (5 mg total) by mouth daily. 03/23/20  Yes [name], MD  metoCLOPramide (REGLAN) 10 MG tablet TAKE 1 TABLET BY MOUTH EVERYDAY AT BEDTIME 07/25/20  Yes Pyrtle, Lajuan Lines, MD  Multiple Vitamin (MULTIVITAMIN) tablet Take 1 tablet by mouth daily.   Yes [provider]  senna (SENOKOT) 8.6 MG tablet Take 2 tablets by mouth daily as needed for constipation.   Yes [provider]  torsemide (DEMADEX) 20 MG tablet Take 20 mg by mouth daily. 03/07/20 03/07/21 Yes [provider]    Allergies: No Active Allergies  Social History:  reports that he quit smoking about 12 years ago. His smoking use included cigarettes. He has never used smokeless tobacco. He reports current alcohol use. He reports that he does not use drugs.   Family History: Family History  Problem Relation Age of Onset  . Hyperlipidemia Mother   . Diabetes Mother   . Liver disease Mother        NASH  . Other Mother        immune thrombocytopenic pupura (ITP)  . Hyperlipidemia Father   . Diabetes Father   . Heart disease Father   . Hypertension Father   . Lymphoma Father   . Hemochromatosis Other        Grandmother (? which side)  . Polycythemia Other   . Colon cancer Neg Hx   . Esophageal cancer Neg Hx   . Rectal cancer Neg Hx   . Stomach cancer Neg Hx     Review of Systems: Review of Systems  Constitutional: Negative for chills and fever.  HENT: Negative for hearing loss.   Respiratory: Negative for shortness of breath.   Cardiovascular: Positive for leg swelling. Negative for chest pain.  Gastrointestinal: Negative for abdominal pain, nausea and vomiting.  Genitourinary: Negative for dysuria.  Musculoskeletal: Negative for myalgias.  Skin: Negative for rash.  Neurological: Positive for headaches. Negative for dizziness.  Psychiatric/Behavioral: Negative for depression.    Physical Exam BP (!) 156/96   Pulse 67   Temp 98.1 F (36.7 C) (Oral)   Ht 5'  10.5" (1.791 m)   Wt 238 lb 6.4 oz (108.1 kg)   SpO2 98%   BMI 33.72 kg/m  CONSTITUTIONAL: No acute distress. HEENT:  Normocephalic, atraumatic, extraocular motion intact. NECK: Trachea is midline, and there is no jugular venous distension.  RESPIRATORY:  Lungs are clear, and breath sounds are equal bilaterally. Normal respiratory effort without pathologic use of accessory muscles. CARDIOVASCULAR: Heart is  regular without murmurs, gallops, or rubs. GI: The abdomen is soft, non-distended, non-tender to palpation.  No evidence of abdominal hernia.  Prior laparoscopic incisions well healed.  MUSCULOSKELETAL:  Normal muscle strength and tone in all four extremities. SKIN: Skin turgor is normal. There are no pathologic skin lesions.  NEUROLOGIC:  Motor and sensation is grossly normal.  Cranial nerves are grossly intact. PSYCH:  Alert and oriented to person, place and time. Affect is normal.  Laboratory Analysis: Labs from 06/30/20: Na 137, K 4.0, Cl 107, CO2 22, BUN 36, Cr 4.86, Ca 8.5, GFR 13.  WBC 7.4, Hgb 10.1, Hct 30.9, Plt 187.  Imaging: No results found.  Assessment and Plan: This is a 55 y.o. male with chronic kidney disease, approaching need for dialysis.  --Discussed with the patient the role for PD catheter placement to help with his dialysis needs.  He does not have any hernias on exam, and he has only had one intra-abdominal laparoscopic surgery before.  He would be a good candidate for PD catheter placement.  Reviewed with him the surgery at length and discussed with him the risks of bleeding, infection, and injury to surrounding structures.  He is willing to proceed. --Based on his schedule, will schedule him for laparoscopic assisted PD catheter placement on 08/28/20.  He has been vaccinated for COVID-19 so would not need preop testing at this time.  However, given his CABG and BKA history, would need cardiology clearance.  He is currently on Aspirin, and based on scheduling, his  last dose would be 08/22/20.  Face-to-face time spent with the patient and care providers was 80 minutes, with more than 50% of the time spent counseling, educating, and coordinating care of the patient.     Melvyn Neth, [facility]

## 2020-08-06 ENCOUNTER — Telehealth: Payer: Self-pay | Admitting: Surgery

## 2020-08-06 NOTE — Telephone Encounter (Signed)
Outgoing call is made, left message to call.  Please advise patient of Pre-Admission date/time, COVID Testing date and Surgery date.  Surgery Date: 08/30/20 Preadmission Testing Date: 08/17/20 (phone 8a-1p) Covid Testing Date: Not needed, patient has been fully vaccinated.    Also patient to call at [phone], between 1-3:00pm the day before surgery, to find out what time to arrive for surgery.

## 2020-08-09 NOTE — Telephone Encounter (Signed)
Incoming call, patient and wife aware of all that is scheduled.

## 2020-08-10 ENCOUNTER — Observation Stay (HOSPITAL_COMMUNITY): Payer: Managed Care, Other (non HMO)

## 2020-08-10 ENCOUNTER — Other Ambulatory Visit: Payer: Self-pay

## 2020-08-10 ENCOUNTER — Emergency Department (HOSPITAL_BASED_OUTPATIENT_CLINIC_OR_DEPARTMENT_OTHER): Payer: Managed Care, Other (non HMO)

## 2020-08-10 ENCOUNTER — Observation Stay (HOSPITAL_BASED_OUTPATIENT_CLINIC_OR_DEPARTMENT_OTHER)
Admission: EM | Admit: 2020-08-10 | Discharge: 2020-08-12 | Disposition: A | Discharge status: Home / Self Care | Payer: Managed Care, Other (non HMO) | Attending: Emergency Medicine | Admitting: Emergency Medicine

## 2020-08-10 ENCOUNTER — Encounter (HOSPITAL_BASED_OUTPATIENT_CLINIC_OR_DEPARTMENT_OTHER): Payer: Self-pay

## 2020-08-10 DIAGNOSIS — E1159 Type 2 diabetes mellitus with other circulatory complications: Secondary | ICD-10-CM | POA: Diagnosis present

## 2020-08-10 DIAGNOSIS — Z79899 Other long term (current) drug therapy: Secondary | ICD-10-CM | POA: Insufficient documentation

## 2020-08-10 DIAGNOSIS — R778 Other specified abnormalities of plasma proteins: Secondary | ICD-10-CM

## 2020-08-10 DIAGNOSIS — E785 Hyperlipidemia, unspecified: Secondary | ICD-10-CM | POA: Diagnosis present

## 2020-08-10 DIAGNOSIS — Z20822 Contact with and (suspected) exposure to covid-19: Secondary | ICD-10-CM | POA: Insufficient documentation

## 2020-08-10 DIAGNOSIS — N189 Chronic kidney disease, unspecified: Secondary | ICD-10-CM

## 2020-08-10 DIAGNOSIS — Z794 Long term (current) use of insulin: Secondary | ICD-10-CM | POA: Diagnosis not present

## 2020-08-10 DIAGNOSIS — I2510 Atherosclerotic heart disease of native coronary artery without angina pectoris: Secondary | ICD-10-CM | POA: Diagnosis present

## 2020-08-10 DIAGNOSIS — I120 Hypertensive chronic kidney disease with stage 5 chronic kidney disease or end stage renal disease: Secondary | ICD-10-CM | POA: Diagnosis not present

## 2020-08-10 DIAGNOSIS — Z7982 Long term (current) use of aspirin: Secondary | ICD-10-CM | POA: Diagnosis not present

## 2020-08-10 DIAGNOSIS — J45909 Unspecified asthma, uncomplicated: Secondary | ICD-10-CM | POA: Diagnosis not present

## 2020-08-10 DIAGNOSIS — E1022 Type 1 diabetes mellitus with diabetic chronic kidney disease: Secondary | ICD-10-CM | POA: Insufficient documentation

## 2020-08-10 DIAGNOSIS — D638 Anemia in other chronic diseases classified elsewhere: Secondary | ICD-10-CM | POA: Diagnosis present

## 2020-08-10 DIAGNOSIS — D631 Anemia in chronic kidney disease: Secondary | ICD-10-CM | POA: Insufficient documentation

## 2020-08-10 DIAGNOSIS — Z951 Presence of aortocoronary bypass graft: Secondary | ICD-10-CM | POA: Diagnosis not present

## 2020-08-10 DIAGNOSIS — R7989 Other specified abnormal findings of blood chemistry: Secondary | ICD-10-CM | POA: Diagnosis present

## 2020-08-10 DIAGNOSIS — N179 Acute kidney failure, unspecified: Secondary | ICD-10-CM | POA: Insufficient documentation

## 2020-08-10 DIAGNOSIS — I25110 Atherosclerotic heart disease of native coronary artery with unstable angina pectoris: Secondary | ICD-10-CM | POA: Diagnosis not present

## 2020-08-10 DIAGNOSIS — E871 Hypo-osmolality and hyponatremia: Secondary | ICD-10-CM | POA: Insufficient documentation

## 2020-08-10 DIAGNOSIS — Z955 Presence of coronary angioplasty implant and graft: Secondary | ICD-10-CM | POA: Diagnosis not present

## 2020-08-10 DIAGNOSIS — N185 Chronic kidney disease, stage 5: Secondary | ICD-10-CM | POA: Diagnosis present

## 2020-08-10 DIAGNOSIS — Z87891 Personal history of nicotine dependence: Secondary | ICD-10-CM | POA: Insufficient documentation

## 2020-08-10 DIAGNOSIS — I152 Hypertension secondary to endocrine disorders: Secondary | ICD-10-CM | POA: Diagnosis present

## 2020-08-10 DIAGNOSIS — E1169 Type 2 diabetes mellitus with other specified complication: Secondary | ICD-10-CM | POA: Diagnosis present

## 2020-08-10 DIAGNOSIS — R55 Syncope and collapse: Secondary | ICD-10-CM | POA: Diagnosis present

## 2020-08-10 LAB — TROPONIN I (HIGH SENSITIVITY)
Troponin I (High Sensitivity): 33 ng/L — ABNORMAL HIGH (ref <18)
Troponin I (High Sensitivity): 29 ng/L — ABNORMAL HIGH (ref <18)

## 2020-08-10 LAB — URINALYSIS, ROUTINE W REFLEX MICROSCOPIC
Bilirubin Urine: NEGATIVE
Glucose, UA: 250 mg/dL — AB
Ketones, ur: NEGATIVE mg/dL
Leukocytes,Ua: NEGATIVE
Nitrite: NEGATIVE
Protein, ur: >300 mg/dL — AB
Specific Gravity, Urine: 1.02 (ref 1.005–1.030)
pH: 5.5 (ref 5.0–8.0)

## 2020-08-10 LAB — CBC WITH DIFFERENTIAL/PLATELET
Abs Immature Granulocytes: 0.08 10*3/uL — ABNORMAL HIGH (ref 0.00–0.07)
Basophils Absolute: 0 10*3/uL (ref 0.0–0.1)
Basophils Relative: 0 %
Eosinophils Absolute: 0.3 10*3/uL (ref 0.0–0.5)
Eosinophils Relative: 2 %
HCT: 25.8 % — ABNORMAL LOW (ref 39.0–52.0)
Hemoglobin: 8.7 g/dL — ABNORMAL LOW (ref 13.0–17.0)
Immature Granulocytes: 1 %
Lymphocytes Relative: 6 %
Lymphs Abs: 0.9 10*3/uL (ref 0.7–4.0)
MCH: 30.9 pg (ref 26.0–34.0)
MCHC: 33.7 g/dL (ref 30.0–36.0)
MCV: 91.5 fL (ref 80.0–100.0)
Monocytes Absolute: 1.3 10*3/uL — ABNORMAL HIGH (ref 0.1–1.0)
Monocytes Relative: 9 %
Neutro Abs: 12 10*3/uL — ABNORMAL HIGH (ref 1.7–7.7)
Neutrophils Relative %: 82 %
Platelets: 177 10*3/uL (ref 150–400)
RBC: 2.82 MIL/uL — ABNORMAL LOW (ref 4.22–5.81)
RDW: 13.1 % (ref 11.5–15.5)
WBC: 14.5 10*3/uL — ABNORMAL HIGH (ref 4.0–10.5)
nRBC: 0 % (ref 0.0–0.2)

## 2020-08-10 LAB — COMPREHENSIVE METABOLIC PANEL
ALT: 104 U/L — ABNORMAL HIGH (ref 0–44)
AST: 324 U/L — ABNORMAL HIGH (ref 15–41)
Albumin: 2.5 g/dL — ABNORMAL LOW (ref 3.5–5.0)
Alkaline Phosphatase: 93 U/L (ref 38–126)
Anion gap: 12 (ref 5–15)
BUN: 66 mg/dL — ABNORMAL HIGH (ref 6–20)
CO2: 19 mmol/L — ABNORMAL LOW (ref 22–32)
Calcium: 8.4 mg/dL — ABNORMAL LOW (ref 8.9–10.3)
Chloride: 97 mmol/L — ABNORMAL LOW (ref 98–111)
Creatinine, Ser: 5.65 mg/dL — ABNORMAL HIGH (ref 0.61–1.24)
GFR, Estimated: 11 mL/min — ABNORMAL LOW (ref >60)
Glucose, Bld: 135 mg/dL — ABNORMAL HIGH (ref 70–99)
Potassium: 3.9 mmol/L (ref 3.5–5.1)
Sodium: 128 mmol/L — ABNORMAL LOW (ref 135–145)
Total Bilirubin: 0.8 mg/dL (ref 0.3–1.2)
Total Protein: 6.3 g/dL — ABNORMAL LOW (ref 6.5–8.1)

## 2020-08-10 LAB — RESP PANEL BY RT-PCR (FLU A&B, COVID) ARPGX2
Influenza A by PCR: NEGATIVE
Influenza B by PCR: NEGATIVE
SARS Coronavirus 2 by RT PCR: NEGATIVE

## 2020-08-10 LAB — URINALYSIS, MICROSCOPIC (REFLEX)

## 2020-08-10 LAB — GLUCOSE, CAPILLARY: Glucose-Capillary: 106 mg/dL — ABNORMAL HIGH (ref 70–99)

## 2020-08-10 LAB — BRAIN NATRIURETIC PEPTIDE: B Natriuretic Peptide: 109.6 pg/mL — ABNORMAL HIGH (ref 0.0–100.0)

## 2020-08-10 MED ORDER — ASPIRIN 81 MG PO CHEW
81.0000 mg | CHEWABLE_TABLET | Freq: Every day | ORAL | Status: DC
  Administered 2020-08-11 – 2020-08-12 (×2): 81 mg via ORAL
  Filled 2020-08-10 (×2): qty 1

## 2020-08-10 MED ORDER — DORZOLAMIDE HCL 2 % OP SOLN
1.0000 [drp] | Freq: Two times a day (BID) | OPHTHALMIC | Status: DC
  Administered 2020-08-11 – 2020-08-12 (×4): 1 [drp] via OPHTHALMIC
  Filled 2020-08-10: qty 10

## 2020-08-10 MED ORDER — LATANOPROST 0.005 % OP SOLN
1.0000 [drp] | Freq: Every day | OPHTHALMIC | Status: DC
  Administered 2020-08-11 (×2): 1 [drp] via OPHTHALMIC
  Filled 2020-08-10: qty 2.5

## 2020-08-10 MED ORDER — ONDANSETRON HCL 4 MG/2ML IJ SOLN: 4.0000 mg | Freq: Four times a day (QID) | INTRAMUSCULAR | Status: DC | PRN

## 2020-08-10 MED ORDER — SODIUM CHLORIDE 0.9 % IV BOLUS
500.0000 mL | Freq: Once | INTRAVENOUS | Status: AC
  Administered 2020-08-11: 500 mL via INTRAVENOUS

## 2020-08-10 MED ORDER — HEPARIN SODIUM (PORCINE) 5000 UNIT/ML IJ SOLN
5000.0000 [IU] | Freq: Three times a day (TID) | INTRAMUSCULAR | Status: DC
  Administered 2020-08-11 (×3): 5000 [IU] via SUBCUTANEOUS
  Filled 2020-08-10 (×4): qty 1

## 2020-08-10 MED ORDER — INSULIN ASPART 100 UNIT/ML IJ SOLN
0.0000 [IU] | Freq: Three times a day (TID) | INTRAMUSCULAR | Status: DC
  Administered 2020-08-11 (×2): 2 [IU] via SUBCUTANEOUS
  Administered 2020-08-12: 3 [IU] via SUBCUTANEOUS

## 2020-08-10 MED ORDER — ONDANSETRON HCL 4 MG PO TABS: 4.0000 mg | ORAL_TABLET | Freq: Four times a day (QID) | ORAL | Status: DC | PRN

## 2020-08-10 MED ORDER — SODIUM CHLORIDE 0.9% FLUSH
3.0000 mL | Freq: Two times a day (BID) | INTRAVENOUS | Status: DC
  Administered 2020-08-11 – 2020-08-12 (×4): 3 mL via INTRAVENOUS

## 2020-08-10 NOTE — H&P (Signed)
History and Physical    BEKIM WERNTZ UUV:[id number] DOB: 12/30/1965 DOA: 08/10/2020  PCP: [name], MD  Patient coming from: [facility] [facility]  I have personally briefly reviewed patient's old medical records in [place]  Chief Complaint: Syncope  HPI: SEYDINA HOLLIMAN is a 55 y.o. male with medical history significant for CAD s/p CABG, CKD stage V with plans to start peritoneal dialysis soon, insulin-dependent diabetes, HTN, HLD, PVD s/p left BKA, chronic neck and back pain, who is legally blind presented to [facility] ED for evaluation after a syncopal episode at home.  Patient states about 1 month ago he was started on clonidine patch for management of uncontrolled hypertension.  He says immediately after starting clonidine he noticed having generalized weakness and fatigue.  On 4/25 he developed nausea without emesis which was ongoing for about 36 hours.  He took Pepto-Bismol which she says caused him to become constipated.  2 days ago he took MiraLAX and afterwards developed frequent loose stools which is ongoing until yesterday.  He has noted increased muscle weakness over the last 2 days.  Earlier today after getting out of the shower he felt very weak/lightheaded.  His spouse was helping him ambulate when he had syncopal versus near syncopal episodes.  He was brought to the ED for further evaluation.  He otherwise denies any chest pain, dyspnea, or abdominal pain.  He has not seen any obvious bleeding however cannot always visualize his stool due to his poor vision.  He has noticed some tenderness to his left lateral thigh with slight redness which he attributes to contact with a handrail by his toilet.  He says he does still make urine however decreased amount these last few days as he has not been eating or drinking much.  [facility] ED Course:  Initial vitals showed BP 111/66, pulse 62, RR 14, temp 98.2 F, SPO2 100% on room air.  Labs  show WBC 14.5, hemoglobin 8.7, platelets 177,000, sodium 128, potassium 3.9, bicarb 19, BUN 66, creatinine 5.65, GFR 11, serum glucose 135, AST 324, ALT 104, alk phos 93, total bilirubin 0.8, BNP 109.6, high-sensitivity troponin I 33 > 29.  Urinalysis showed negative ketones, >300 protein, negative nitrites, negative leukocytes, 0-5 RBCs and WBCs per hpf, many bacteria on microscopy.  SARS-CoV-2 PCR is negative.  Influenza A/B PCR negative.  Portable chest x-ray shows prior CABG changes without focal consolidation, edema, or effusion.  Orthostatic vital signs were positive with significant drop in SBP from lying to sitting position.  The hospitalist service was consulted to admit for further evaluation and management.  Review of Systems: All systems reviewed and are negative except as documented in history of present illness above.   Past Medical History:  Diagnosis Date  . Arthritis   . Asthma    as child  . Barrett's esophagus 02/16/2014   short segment  . Cataract   . Chronic kidney disease (CKD), stage IV (severe) ([place])   . Coronary artery disease    DES DIAG1 11/20/09 [facility])  . DDD (degenerative disc disease), cervical   . Diabetes mellitus without complication ([place])   . Diabetic osteomyelitis ([place])   . Diabetic peripheral neuropathy ([place])   . Diabetic ulcer of foot with muscle involvement without evidence of necrosis ([place])    DIABETIC ULCERATIONS ASSOCIATED WITH IRRITATION LATERAL ANKLE LEFT GREATER THAN RIGHT WITH MILD CELLULITIS   . Diverticulosis   . DKA (diabetic ketoacidoses) 08/23/2017  .  Edema, lower extremity   . Fatty liver   . Gastroparesis 2015  . GERD (gastroesophageal reflux disease)   . Gilbert's disease   . Glaucoma   . Heart disease 2011   Patient has stent for 80% blockage  . Hyperlipidemia   . Hypertension   . Left below-knee amputee ([place])   . Legally blind   . Myocardial infarction ([place])   . Neuropathy   . Osteoarthritis of spine   . Osteomyelitis  of left foot ([place])   . PVD (peripheral vascular disease) ([place])   . Seasonal allergies   . Shoulder pain   . Ulcer of foot ([place]) 08.06.2014   DIABETIC ULCERATIONS ASSOCIATED WITH IRRITATION LATERAL ANKLE LEFT GREATER THAN RIGHT WITH MILD CELLULITIS    Past Surgical History:  Procedure Laterality Date  . ABDOMINAL AORTOGRAM N/A 05/20/2016   Procedure: Abdominal Aortogram possible intervention;  Surgeon: Katha Cabal, MD;  Location: [place] [facility];  Service: Cardiovascular;  Laterality: N/A;  . AMPUTATION Left 09/05/2017   Procedure: AMPUTATION BELOW KNEE;  Surgeon: Tania Ade, MD;  Location: [place];  Service: Orthopedics;  Laterality: Left;  . ANTERIOR CERVICAL DECOMP/DISCECTOMY FUSION N/A 05/07/2017   Procedure: ANTERIOR CERVICAL DECOMPRESSION/DISCECTOMY Luevenia Maxin PROSTHESIS,PLATE/SCREWS CERVICAL FIVE - CERVICAL SIX;  Surgeon: Newman Pies, MD;  Location: [facility];  Service: Neurosurgery;  Laterality: N/A;  . CARDIAC CATHETERIZATION    . CATARACT EXTRACTION W/ INTRAOCULAR LENS IMPLANT    . CATARACT EXTRACTION W/PHACO Left 02/26/2017   Procedure: CATARACT EXTRACTION PHACO AND INTRAOCULAR LENS PLACEMENT (IOC);  Surgeon: Eulogio Bear, MD;  Location: [facility];  Service: Ophthalmology;  Laterality: Left;  Lot # [id number] H [place]: 00:25.3 AP%: 6.2 CDE: 1.59   . CHOLECYSTECTOMY N/A   . CORONARY ANGIOPLASTY WITH STENT PLACEMENT  2007   1st diagonal  . CORONARY ARTERY BYPASS GRAFT N/A 01/27/2019   Procedure: OFF PUMP CORONARY ARTERY BYPASS GRAFTING (CABG) X 2 WITH ENDOSCOPIC HARVESTING OF RIGHT GREATER SAPHENOUS VEIN. LIMA TO LAD;  Surgeon: Lajuana Matte, MD;  Location: [place];  Service: Open Heart Surgery;  Laterality: N/A;  . CORONARY STENT INTERVENTION N/A 01/21/2019   Procedure: CORONARY STENT INTERVENTION;  Surgeon: Belva Crome, MD;  Location: [place] [facility];  Service: Cardiovascular;  Laterality: N/A;  . EPIBLEPHERON REPAIR WITH TEAR DUCT PROBING    . EYE  SURGERY Right sept 29n2015   cataract extraction  . INSERTION EXPRESS TUBE SHUNT Right 09/06/2015   Procedure: INSERTION AHMED TUBE SHUNT with tutoplast allograft;  Surgeon: Eulogio Bear, MD;  Location: [facility];  Service: Ophthalmology;  Laterality: Right;  . LEFT HEART CATH AND CORONARY ANGIOGRAPHY N/A 01/20/2019   Procedure: LEFT HEART CATH AND CORONARY ANGIOGRAPHY;  Surgeon: Lorretta Harp, MD;  Location: [facility] [facility];  Service: Cardiovascular;  Laterality: N/A;  . TEE WITHOUT CARDIOVERSION N/A 01/27/2019   Procedure: TRANSESOPHAGEAL ECHOCARDIOGRAM (TEE);  Surgeon: Lajuana Matte, MD;  Location: [facility];  Service: Open Heart Surgery;  Laterality: N/A;  . VASECTOMY      Social History:  reports that he quit smoking about 12 years ago. His smoking use included cigarettes. He has never used smokeless tobacco. He reports current alcohol use. He reports that he does not use drugs.  No Known Allergies  Family History  Problem Relation Age of Onset  . Hyperlipidemia Mother   . Diabetes Mother   . Liver disease Mother        NASH  . Other Mother  immune thrombocytopenic pupura (ITP)  . Hyperlipidemia Father   . Diabetes Father   . Heart disease Father   . Hypertension Father   . Lymphoma Father   . Hemochromatosis Other        Grandmother (? which side)  . Polycythemia Other   . Colon cancer Neg Hx   . Esophageal cancer Neg Hx   . Rectal cancer Neg Hx   . Stomach cancer Neg Hx      Prior to Admission medications   Medication Sig Start Date End Date Taking? Authorizing Provider  acetaminophen (TYLENOL) 325 MG tablet Take 1 tablet (325 mg total) by mouth every 6 (six) hours as needed for mild pain (pain score 1-3 or temp > 100.5). 09/06/17   Grier Mitts, PA-C  aspirin (ASPIRIN CHILDRENS) 81 MG chewable tablet Chew 1 tablet (81 mg total) by mouth daily. 03/04/19   Lajuana Matte, MD  atorvastatin (LIPITOR) 80 MG tablet TAKE 1 TABLET BY MOUTH  EVERY DAY 12/01/19   [name], MD  brimonidine-timolol (COMBIGAN) 0.2-0.5 % ophthalmic solution Place 1 drop into the right eye every 12 (twelve) hours.     [provider]  carvedilol (COREG) 25 MG tablet TAKE 1 TABLET (25 MG TOTAL) BY MOUTH 2 (TWO) TIMES DAILY WITH A MEAL. 07/25/20   [name], MD  cholecalciferol (VITAMIN D3) 25 MCG (1000 UT) tablet Take 1,000 Units by mouth daily.    [provider]  cloNIDine (CATAPRES - DOSED IN MG/24 HR) 0.1 mg/24hr patch Place 1 patch (0.1 mg total) onto the skin once a week. 07/04/20   [name], MD  Continuous Blood Gluc Sensor (DEXCOM G6 SENSOR) MISC Inject 1 Device into the skin as directed. Place a new sensor every 10 days to check blood sugar at least 4 times daily 02/22/20   Shamleffer, Melanie Crazier, MD  Continuous Blood Gluc Transmit (DEXCOM G6 TRANSMITTER) MISC Inject 1 Device into the skin as directed. Use to check blood sugar at least 4 times daily 02/22/20   Shamleffer, Melanie Crazier, MD  dorzolamide (TRUSOPT) 2 % ophthalmic solution 1 drop 2 (two) times daily. 10/18/19   [provider]  esomeprazole (NEXIUM) 20 MG capsule Take 20 mg by mouth daily at 12 noon.    [provider]  ezetimibe (ZETIA) 10 MG tablet Take 1 tablet (10 mg total) by mouth daily. 03/22/20   [name], MD  fluticasone (FLONASE) 50 MCG/ACT nasal spray SPRAY 2 SPRAYS INTO EACH NOSTRIL EVERY DAY Patient taking differently: Place 1 spray into both nostrils daily as needed for allergies. 11/14/19   [name], MD  gabapentin (NEURONTIN) 300 MG capsule Take 1 capsule (300 mg total) by mouth 2 (two) times daily. 01/19/20   [name], MD  hydrALAZINE (APRESOLINE) 25 MG tablet Take 12.5 mg by mouth in the morning and at bedtime. 01/26/20   [provider]  insulin degludec (TRESIBA FLEXTOUCH) 200 UNIT/ML FlexTouch Pen Inject 40 Units into the skin 2 (two) times daily. 06/18/20   Shamleffer,  Melanie Crazier, MD  insulin lispro (HUMALOG KWIKPEN) 100 UNIT/ML KwikPen Inject 6 Units into the skin 3 (three) times daily. Inject up to 15 units TID with meals. Further refills to come from endocrinology 02/22/20   Shamleffer, Melanie Crazier, MD  Insulin Pen Needle 32G X 4 MM MISC Inject 5 pens into the skin daily. Use to inject Lantus and Novolog up to 5 times daily 02/22/20   Shamleffer, [employer]  Amedeo Kinsman, MD  Lactulose 20 GM/30ML SOLN Take 30 mLs (20 g total) by mouth as needed. 03/04/19   Lightfoot, Lucile Crater, MD  latanoprost (XALATAN) 0.005 % ophthalmic solution Place 1 drop into both eyes at bedtime. 03/03/17   [provider]  lisinopril (ZESTRIL) 5 MG tablet Take 1 tablet (5 mg total) by mouth daily. 03/23/20   [name], MD  metoCLOPramide (REGLAN) 10 MG tablet TAKE 1 TABLET BY MOUTH EVERYDAY AT BEDTIME 07/25/20   Pyrtle, Lajuan Lines, MD  Multiple Vitamin (MULTIVITAMIN) tablet Take 1 tablet by mouth daily.    [provider]  senna (SENOKOT) 8.6 MG tablet Take 2 tablets by mouth daily as needed for constipation.    [provider]  torsemide (DEMADEX) 20 MG tablet Take 20 mg by mouth daily. 03/07/20 03/07/21  [provider]    Physical Exam: Vitals:   08/10/20 1800 08/10/20 1830 08/10/20 1930 08/10/20 2056  BP: 124/73 109/70 107/82 112/76  Pulse: 63 65 66 69  Resp: '13 18 14 20  ' Temp:    98.1 F (36.7 C)  TempSrc:    Oral  SpO2: 100% 100% 98% 98%  Weight:    103.9 kg  Height:    5' 10.5" (1.791 m)   Constitutional: Resting supine in bed, NAD, calm, comfortable Eyes: Legally blind, lids and conjunctivae normal ENMT: Mucous membranes are dry. Posterior pharynx clear of any exudate or lesions.Normal dentition.  Neck: normal, supple, no masses. Respiratory: clear to auscultation bilaterally, no wheezing, no crackles. Normal respiratory effort. No accessory muscle use.  Cardiovascular: Regular rate and rhythm, no murmurs / rubs / gallops.  +1  right lower extremity edema, chronic for many years per patient. 2+ pedal pulse on the right. Abdomen: no tenderness, no masses palpated. Bowel sounds positive.  Musculoskeletal: S/p left BKA.  No clubbing / cyanosis. No joint deformity upper and lower extremities. Good ROM, no contractures. Normal muscle tone.  Skin: Superficial ulcer medial right lower leg above the ankle related to friction from prosthesis.  Slight erythema left lateral upper thigh with minimal tenderness to palpation. Neurologic: Sensation intact. Strength equal throughout. Psychiatric: Normal judgment and insight. Alert and oriented x 3. Normal mood.   Labs on Admission: I have personally reviewed following labs and imaging studies  CBC: Recent Labs  Lab 08/10/20 1440  WBC 14.5*  NEUTROABS 12.0*  HGB 8.7*  HCT 25.8*  MCV 91.5  PLT 154   Basic Metabolic Panel: Recent Labs  Lab 08/10/20 1440  NA 128*  K 3.9  CL 97*  CO2 19*  GLUCOSE 135*  BUN 66*  CREATININE 5.65*  CALCIUM 8.4*   GFR: Estimated Creatinine Clearance: 17.6 mL/min (A) (by C-G formula based on SCr of 5.65 mg/dL (H)). Liver Function Tests: Recent Labs  Lab 08/10/20 1440  AST 324*  ALT 104*  ALKPHOS 93  BILITOT 0.8  PROT 6.3*  ALBUMIN 2.5*   No results for input(s): LIPASE, AMYLASE in the last 168 hours. No results for input(s): AMMONIA in the last 168 hours. Coagulation Profile: No results for input(s): INR, PROTIME in the last 168 hours. Cardiac Enzymes: No results for input(s): CKTOTAL, CKMB, CKMBINDEX, TROPONINI in the last 168 hours. BNP (last 3 results) No results for input(s): PROBNP in the last 8760 hours. HbA1C: No results for input(s): HGBA1C in the last 72 hours. CBG: No results for input(s): GLUCAP in the last 168 hours. Lipid Profile: No results for input(s): CHOL, HDL, LDLCALC, TRIG, CHOLHDL, LDLDIRECT in  the last 72 hours. Thyroid Function Tests: No results for input(s): TSH, T4TOTAL, FREET4, T3FREE, THYROIDAB  in the last 72 hours. Anemia Panel: No results for input(s): VITAMINB12, FOLATE, FERRITIN, TIBC, IRON, RETICCTPCT in the last 72 hours. Urine analysis:    Component Value Date/Time   COLORURINE YELLOW 08/10/2020 1615   APPEARANCEUR CLOUDY (A) 08/10/2020 1615   LABSPEC 1.020 08/10/2020 1615   PHURINE 5.5 08/10/2020 1615   GLUCOSEU 250 (A) 08/10/2020 1615   HGBUR LARGE (A) 08/10/2020 1615   BILIRUBINUR NEGATIVE 08/10/2020 1615   KETONESUR NEGATIVE 08/10/2020 1615   PROTEINUR >300 (A) 08/10/2020 1615   UROBILINOGEN 0.2 10/22/2006 0948   NITRITE NEGATIVE 08/10/2020 1615   LEUKOCYTESUR NEGATIVE 08/10/2020 1615    Radiological Exams on Admission: DG Chest Port 1 View  Result Date: 08/10/2020 CLINICAL DATA:  Loss of consciousness. EXAM: PORTABLE CHEST 1 VIEW COMPARISON:  July 31, 2020 FINDINGS: Multiple sternal wires and vascular clips are seen. The heart size and mediastinal contours are within normal limits. Both lungs are clear. A radiopaque fusion plate and screws are seen overlying the lower cervical spine. IMPRESSION: 1. Evidence of prior median sternotomy/CABG. 2. No acute or active cardiopulmonary disease. Electronically Signed   By: Virgina Norfolk M.D.   On: 08/10/2020 20:37    EKG: Personally reviewed. Sinus rhythm with minimal ST depression inferior leads and minimal ST elevation anterior leads.  T wave inversion in lead III less pronounced compared to prior.  Assessment/Plan Principal Problem:   Syncope Active Problems:   Hyperlipidemia associated with type 2 diabetes mellitus ([place])   Hypertension associated with diabetes ([place])   Hyponatremia   Coronary artery disease involving native coronary artery of native heart without angina pectoris   Anemia of chronic disease   CKD (chronic kidney disease), stage V ([facility])   Elevated LFTs   JAMESROBERT OHANESIAN is a 55 y.o. male with medical history significant for CAD s/p CABG, CKD stage V with plans to start peritoneal dialysis  soon, insulin-dependent diabetes, HTN, HLD, PVD s/p left BKA, chronic neck and back pain, who is legally blind is admitted for evaluation of syncope.  Orthostatic syncope: History suggestive of orthostatic syncope likely related to antihypertensives and dehydration from poor oral intake and GI losses.  Also reports generalized weakness and muscle aches since starting clonidine patch. -Holding home antihypertensives, reintroduce as able -Recommend discontinuing clonidine patch -Give 500 cc NS over 2 hours -Check CK -PT/OT eval  CKD stage V: Follows with nephrology, Dr. Candiss Norse.  Has plans for peritoneal dialysis catheter placement on 5/19.  Currently no emergent need for HD.  Anemia of chronic kidney disease: Hemoglobin 8.7 on admission.  No obvious bleeding.  Continue to monitor.  Leukocytosis: Without obvious infection.  Possibly reactive.  Continue to monitor.  Hyponatremia: Mild and likely hypovolemic from GI losses.  Giving IV fluids as above.  Elevated LFTs: Possibly mild hepatic injury related to hypovolemia.  Holding home statin.  Check RUQ ultrasound.  Repeat labs in AM.  CAD s/p CABG: Chronic and stable.  Minimal troponin elevation likely demand ischemia from hypotension.  Continue aspirin.  Statin on hold as above.  Insulin-dependent diabetes: Place on SSI and adjust as needed.  Hypertension: Holding home antihypertensives due to orthostasis.  Hyperlipidemia: Statin on hold due to elevated LFTs.  PVD s/p left BKA: Noted.  DVT prophylaxis: Subcutaneous heparin Code Status: Full code, confirmed with patient Family Communication: Discussed with patient, he has discussed with family Disposition Plan: From home and likely  discharge to home pending clinical progress Consults called: None Level of care: Telemetry Medical Admission status:   Status is: Observation  The patient remains OBS appropriate and will d/c before 2 midnights.  Dispo: The patient is from:  Home              Anticipated d/c is to: Home              Patient currently is not medically stable to d/c.   Difficult to place patient No Zada Finders MD [facility]  If 7PM-7AM, please contact night-coverage [email or web address]  08/10/2020, 9:49 PM

## 2020-08-10 NOTE — ED Provider Notes (Signed)
[facility] EMERGENCY DEPARTMENT Provider Note   CSN: [id number] Arrival date & time: 08/10/20  1316     History Chief Complaint  Patient presents with  . Loss of Consciousness    Chase Clark is a 55 y.o. male.  The history is provided by the patient. No language interpreter was used.  Loss of Consciousness Episode history:  Single Most recent episode:  Today Timing:  Constant Progression:  Worsening Chronicity:  New Witnessed: yes   Relieved by:  Nothing Worsened by:  Nothing Ineffective treatments:  None tried Associated symptoms: dizziness and malaise/fatigue   Associated symptoms: no chest pain   Pt reports he got out of the shower today and passed out.  Pt reports he has been on clonidine for a month.  Pt reports he has been having increased fatigue.  Pt reports some discomfort in his neck and back today.  Pt reports he has chronic neck and back pain      Past Medical History:  Diagnosis Date  . Arthritis   . Asthma    as child  . Barrett's esophagus 02/16/2014   short segment  . Cataract   . Chronic kidney disease (CKD), stage IV (severe) ([place])   . Coronary artery disease    DES DIAG1 11/20/09 [facility])  . DDD (degenerative disc disease), cervical   . Diabetes mellitus without complication ([place])   . Diabetic osteomyelitis ([place])   . Diabetic peripheral neuropathy ([place])   . Diabetic ulcer of foot with muscle involvement without evidence of necrosis ([place])    DIABETIC ULCERATIONS ASSOCIATED WITH IRRITATION LATERAL ANKLE LEFT GREATER THAN RIGHT WITH MILD CELLULITIS   . Diverticulosis   . DKA (diabetic ketoacidoses) 08/23/2017  . Edema, lower extremity   . Fatty liver   . Gastroparesis 2015  . GERD (gastroesophageal reflux disease)   . Gilbert's disease   . Glaucoma   . Heart disease 2011   Patient has stent for 80% blockage  . Hyperlipidemia   . Hypertension   . Left below-knee amputee ([place])   . Legally blind   . Myocardial infarction ([place])    . Neuropathy   . Osteoarthritis of spine   . Osteomyelitis of left foot ([place])   . PVD (peripheral vascular disease) ([place])   . Seasonal allergies   . Shoulder pain   . Ulcer of foot ([place]) 08.06.2014   DIABETIC ULCERATIONS ASSOCIATED WITH IRRITATION LATERAL ANKLE LEFT GREATER THAN RIGHT WITH MILD CELLULITIS    Patient Active Problem List   Diagnosis Date Noted  . Leg wound, right 06/07/2020  . Chronic headaches 06/07/2020  . Colon polyps 06/07/2020  . Bloating 03/02/2020  . 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 Type 1 diabetes mellitus with stage 4 chronic kidney disease ([place]) 06/30/2019  . Type 1 diabetes mellitus with diabetic polyneuropathy ([place]) 06/30/2019  . Diabetes mellitus type I ([place]) 06/30/2019  . Left below-knee amputee ([place]) 06/30/2019  . Swelling of limb 06/07/2019  . Vision changes 02/22/2019  . 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 Nephrotic syndrome 01/19/2019  . Allergic rhinitis 07/19/2018  . Amputation of left lower extremity below knee ([place]) 09/09/2017  . Benign prostatic hyperplasia   . Hyperlipidemia   . Chronic kidney disease (CKD), stage IV (severe) ([place])   . Neuropathic pain   . Coronary artery disease involving native coronary artery of native heart without angina pectoris   . Anemia of chronic disease   .  Cervical spondylosis with myelopathy and radiculopathy 05/07/2017  . Fatty liver 04/04/2017  . DDD (degenerative disc disease), cervical 01/13/2017  . PAD (peripheral artery disease) ([place]) 08/07/2016  . Risk for falls 07/28/2016  . Diabetic Charcot foot ([place]) 07/21/2016  . Nondisplaced fracture of left calcaneus 07/10/2016  . Wound infection 07/10/2016  . Acute osteomyelitis of right foot ([place]) 07/03/2016  . Diabetic peripheral neuropathy ([place]) 07/03/2016  . Chronic diarrhea 05/17/2016  . Peripheral vascular disease of extremity with claudication ([place]) 09/12/2015  . Anemia 01/12/2014  . Disorder of  ligament of ankle 07/31/2013  . Edema 06/28/2013  . 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 Obesity (BMI 30-39.9) 06/12/2013  . Pain in joint, shoulder region 06/12/2013  . Statin intolerance 06/10/2013  . GILBERT'S SYNDROME 07/17/2006  . Essential hypertension 07/17/2006  . Coronary atherosclerosis 07/17/2006  . OSTEOARTHRITIS 07/17/2006    Past Surgical History:  Procedure Laterality Date  . ABDOMINAL AORTOGRAM N/A 05/20/2016   Procedure: Abdominal Aortogram possible intervention;  Surgeon: Katha Cabal, MD;  Location: [facility];  Service: Cardiovascular;  Laterality: N/A;  . AMPUTATION Left 09/05/2017   Procedure: AMPUTATION BELOW KNEE;  Surgeon: Tania Ade, MD;  Location: [facility];  Service: Orthopedics;  Laterality: Left;  . ANTERIOR CERVICAL DECOMP/DISCECTOMY FUSION N/A 05/07/2017   Procedure: ANTERIOR CERVICAL DECOMPRESSION/DISCECTOMY Luevenia Maxin PROSTHESIS,PLATE/SCREWS CERVICAL FIVE - CERVICAL SIX;  Surgeon: Newman Pies, MD;  Location: [facility];  Service: Neurosurgery;  Laterality: N/A;  . CARDIAC CATHETERIZATION    . CATARACT EXTRACTION W/ INTRAOCULAR LENS IMPLANT    . CATARACT EXTRACTION W/PHACO Left 02/26/2017   Procedure: CATARACT EXTRACTION PHACO AND INTRAOCULAR LENS PLACEMENT (IOC);  Surgeon: Eulogio Bear, MD;  Location: [facility];  Service: Ophthalmology;  Laterality: Left;  Lot # [id number] H [place]: 00:25.3 AP%: 6.2 CDE: 1.59   . CHOLECYSTECTOMY N/A   . CORONARY ANGIOPLASTY WITH STENT PLACEMENT  2007   1st diagonal  . CORONARY ARTERY BYPASS GRAFT N/A 01/27/2019   Procedure: OFF PUMP CORONARY ARTERY BYPASS GRAFTING (CABG) X 2 WITH ENDOSCOPIC HARVESTING OF RIGHT GREATER SAPHENOUS VEIN. LIMA TO LAD;  Surgeon: Lajuana Matte, MD;  Location: [facility];  Service: Open Heart Surgery;  Laterality: N/A;  . CORONARY STENT INTERVENTION N/A 01/21/2019   Procedure: CORONARY STENT INTERVENTION;  Surgeon: Belva Crome, MD;  Location: [facility];  Service: Cardiovascular;  Laterality: N/A;  . EPIBLEPHERON REPAIR WITH TEAR DUCT PROBING    . EYE SURGERY Right sept 29n2015   cataract extraction  . INSERTION EXPRESS TUBE SHUNT Right 09/06/2015   Procedure: INSERTION AHMED TUBE SHUNT with tutoplast allograft;  Surgeon: Eulogio Bear, MD;  Location: [facility];  Service: Ophthalmology;  Laterality: Right;  . LEFT HEART CATH AND CORONARY ANGIOGRAPHY N/A 01/20/2019   Procedure: LEFT HEART CATH AND CORONARY ANGIOGRAPHY;  Surgeon: Lorretta Harp, MD;  Location: [facility];  Service: Cardiovascular;  Laterality: N/A;  . TEE WITHOUT CARDIOVERSION N/A 01/27/2019   Procedure: TRANSESOPHAGEAL ECHOCARDIOGRAM (TEE);  Surgeon: Lajuana Matte, MD;  Location: [facility];  Service: Open Heart Surgery;  Laterality: N/A;  . VASECTOMY         Family History  Problem Relation Age of Onset  . Hyperlipidemia Mother   . Diabetes Mother   . Liver disease Mother        NASH  . Other Mother        immune thrombocytopenic pupura (ITP)  . Hyperlipidemia Father   .  Diabetes Father   . Heart disease Father   . Hypertension Father   . Lymphoma Father   . Hemochromatosis Other        Grandmother (? which side)  . Polycythemia Other   . Colon cancer Neg Hx   . Esophageal cancer Neg Hx   . Rectal cancer Neg Hx   . Stomach cancer Neg Hx     Social History   Tobacco Use  . Smoking status: Former Smoker    Types: Cigarettes    Quit date: 03/14/2008    Years since quitting: 12.4  . Smokeless tobacco: Never Used  Vaping Use  . Vaping Use: Never used  Substance Use Topics  . Alcohol use: Yes    Comment: OCCAS  . Drug use: No    Home Medications Prior to Admission medications   Medication Sig Start Date End Date Taking? Authorizing Provider  acetaminophen (TYLENOL) 325 MG tablet Take 1 tablet (325 mg total) by mouth every 6 (six) hours as needed for mild pain (pain score 1-3 or temp > 100.5). 09/06/17   Grier Mitts, PA-C   aspirin (ASPIRIN CHILDRENS) 81 MG chewable tablet Chew 1 tablet (81 mg total) by mouth daily. 03/04/19   Lajuana Matte, MD  atorvastatin (LIPITOR) 80 MG tablet TAKE 1 TABLET BY MOUTH EVERY DAY 12/01/19   [name], MD  brimonidine-timolol (COMBIGAN) 0.2-0.5 % ophthalmic solution Place 1 drop into the right eye every 12 (twelve) hours.     [provider]  carvedilol (COREG) 25 MG tablet TAKE 1 TABLET (25 MG TOTAL) BY MOUTH 2 (TWO) TIMES DAILY WITH A MEAL. 07/25/20   [name], MD  cholecalciferol (VITAMIN D3) 25 MCG (1000 UT) tablet Take 1,000 Units by mouth daily.    [provider]  cloNIDine (CATAPRES - DOSED IN MG/24 HR) 0.1 mg/24hr patch Place 1 patch (0.1 mg total) onto the skin once a week. 07/04/20   [name], MD  Continuous Blood Gluc Sensor (DEXCOM G6 SENSOR) MISC Inject 1 Device into the skin as directed. Place a new sensor every 10 days to check blood sugar at least 4 times daily 02/22/20   Shamleffer, Melanie Crazier, MD  Continuous Blood Gluc Transmit (DEXCOM G6 TRANSMITTER) MISC Inject 1 Device into the skin as directed. Use to check blood sugar at least 4 times daily 02/22/20   Shamleffer, Melanie Crazier, MD  dorzolamide (TRUSOPT) 2 % ophthalmic solution 1 drop 2 (two) times daily. 10/18/19   [provider]  esomeprazole (NEXIUM) 20 MG capsule Take 20 mg by mouth daily at 12 noon.    [provider]  ezetimibe (ZETIA) 10 MG tablet Take 1 tablet (10 mg total) by mouth daily. 03/22/20   [name], MD  fluticasone (FLONASE) 50 MCG/ACT nasal spray SPRAY 2 SPRAYS INTO EACH NOSTRIL EVERY DAY Patient taking differently: Place 1 spray into both nostrils daily as needed for allergies. 11/14/19   [name], MD  gabapentin (NEURONTIN) 300 MG capsule Take 1 capsule (300 mg total) by mouth 2 (two) times daily. 01/19/20   [name], MD  hydrALAZINE (APRESOLINE) 25 MG tablet Take 12.5 mg by mouth in the  morning and at bedtime. 01/26/20   [provider]  insulin degludec (TRESIBA FLEXTOUCH) 200 UNIT/ML FlexTouch Pen Inject 40 Units into the skin 2 (two) times daily. 06/18/20   Shamleffer, Melanie Crazier, MD  insulin lispro (HUMALOG KWIKPEN) 100 UNIT/ML KwikPen Inject 6 Units into the skin  3 (three) times daily. Inject up to 15 units TID with meals. Further refills to come from endocrinology 02/22/20   Shamleffer, Melanie Crazier, MD  Insulin Pen Needle 32G X 4 MM MISC Inject 5 pens into the skin daily. Use to inject Lantus and Novolog up to 5 times daily 02/22/20   Shamleffer, Melanie Crazier, MD  Lactulose 20 GM/30ML SOLN Take 30 mLs (20 g total) by mouth as needed. 03/04/19   Lightfoot, Lucile Crater, MD  latanoprost (XALATAN) 0.005 % ophthalmic solution Place 1 drop into both eyes at bedtime. 03/03/17   [provider]  lisinopril (ZESTRIL) 5 MG tablet Take 1 tablet (5 mg total) by mouth daily. 03/23/20   [name], MD  metoCLOPramide (REGLAN) 10 MG tablet TAKE 1 TABLET BY MOUTH EVERYDAY AT BEDTIME 07/25/20   Pyrtle, Lajuan Lines, MD  Multiple Vitamin (MULTIVITAMIN) tablet Take 1 tablet by mouth daily.    [provider]  senna (SENOKOT) 8.6 MG tablet Take 2 tablets by mouth daily as needed for constipation.    [provider]  torsemide (DEMADEX) 20 MG tablet Take 20 mg by mouth daily. 03/07/20 03/07/21  [provider]    Allergies    Patient has no known allergies.  Review of Systems   Review of Systems  Constitutional: Positive for malaise/fatigue.  Cardiovascular: Positive for syncope. Negative for chest pain.  Neurological: Positive for dizziness.  All other systems reviewed and are negative.   Physical Exam Updated Vital Signs BP 115/68   Pulse 62   Temp 98.2 F (36.8 C) (Oral)   Resp 14   Ht 5' 10.5" (1.791 m)   Wt 108.6 kg   SpO2 100%   BMI 33.86 kg/m   Physical Exam Vitals and nursing note reviewed.  Constitutional:       Appearance: He is well-developed.  HENT:     Head: Normocephalic and atraumatic.  Eyes:     Conjunctiva/sclera: Conjunctivae normal.  Cardiovascular:     Rate and Rhythm: Normal rate and regular rhythm.     Heart sounds: No murmur heard.   Pulmonary:     Effort: Pulmonary effort is normal. No respiratory distress.     Breath sounds: Normal breath sounds.  Abdominal:     Palpations: Abdomen is soft.     Tenderness: There is no abdominal tenderness.  Musculoskeletal:        General: Normal range of motion.     Cervical back: Neck supple.  Skin:    General: Skin is warm and dry.  Neurological:     General: No focal deficit present.     Mental Status: He is alert.  Psychiatric:        Mood and Affect: Mood normal.     ED Results / Procedures / Treatments   Labs (all labs ordered are listed, but only abnormal results are displayed) Labs Reviewed  CBC WITH DIFFERENTIAL/PLATELET - Abnormal; Notable for the following components:      Result Value   WBC 14.5 (*)    RBC 2.82 (*)    Hemoglobin 8.7 (*)    HCT 25.8 (*)    Neutro Abs 12.0 (*)    Monocytes Absolute 1.3 (*)    Abs Immature Granulocytes 0.08 (*)    All other components within normal limits  COMPREHENSIVE METABOLIC PANEL - Abnormal; Notable for the following components:   Sodium 128 (*)    Chloride 97 (*)    CO2 19 (*)    Glucose,  Bld 135 (*)    BUN 66 (*)    Creatinine, Ser 5.65 (*)    Calcium 8.4 (*)    Total Protein 6.3 (*)    Albumin 2.5 (*)    AST 324 (*)    ALT 104 (*)    GFR, Estimated 11 (*)    All other components within normal limits  URINALYSIS, ROUTINE W REFLEX MICROSCOPIC - Abnormal; Notable for the following components:   APPearance CLOUDY (*)    Glucose, UA 250 (*)    Hgb urine dipstick LARGE (*)    Protein, ur >300 (*)    All other components within normal limits  URINALYSIS, MICROSCOPIC (REFLEX) - Abnormal; Notable for the following components:   Bacteria, UA MANY (*)    All other  components within normal limits  TROPONIN I (HIGH SENSITIVITY) - Abnormal; Notable for the following components:   Troponin I (High Sensitivity) 33 (*)    All other components within normal limits  TROPONIN I (HIGH SENSITIVITY)    EKG EKG Interpretation  Date/Time:  Friday August 10 2020 13:34:31 EDT Ventricular Rate:  63 PR Interval:  198 QRS Duration: 107 QT Interval:  445 QTC Calculation: 456 R Axis:   31 Text Interpretation: Sinus rhythm Minimal ST depression, inferior leads Minimal ST elevation, anterior leads t wave inversion laterally improved Confirmed by Ronnald Nian, Adam ([id number]) on 08/10/2020 1:38:20 PM   Radiology No results found.  Procedures Procedures   Medications Ordered in ED Medications - No data to display  ED Course  I have reviewed the triage vital signs and the nursing notes.  Pertinent labs & imaging results that were available during my care of the patient were reviewed by me and considered in my medical decision making (see chart for details).    MDM Rules/Calculators/A&P                          Pt reports he is suppose to have dialysis shunt placed in 3 weeks.   Final Clinical Impression(s) / ED Diagnoses Final diagnoses:  Syncope and collapse  Hyponatremia  Acute renal failure superimposed on chronic kidney disease, unspecified CKD stage, unspecified acute renal failure type ([place])  Elevated liver function tests  Elevated troponin    Rx / DC Orders ED Discharge Orders    None       Sidney Ace 08/10/20 1856    Breck Coons, MD 08/10/20 2325

## 2020-08-10 NOTE — ED Triage Notes (Signed)
pt arrives with wife who reports patient had LOC at home today when going to sit in chair with wife after taking a shower. Pt reports he has been on Clonidine for the past month and has been feeling fatigued with muscle aches. Pt is legally blind, has also had some NV over the last few days, took Pepto and now reports some constipation. Pt home CBG reads 116 from dexcom on abdomen. WIfe reports BP at home during episode was 90/60.

## 2020-08-11 ENCOUNTER — Observation Stay (HOSPITAL_BASED_OUTPATIENT_CLINIC_OR_DEPARTMENT_OTHER): Payer: Managed Care, Other (non HMO)

## 2020-08-11 ENCOUNTER — Encounter (HOSPITAL_COMMUNITY): Payer: Self-pay | Admitting: Internal Medicine

## 2020-08-11 DIAGNOSIS — N179 Acute kidney failure, unspecified: Secondary | ICD-10-CM | POA: Diagnosis not present

## 2020-08-11 DIAGNOSIS — N185 Chronic kidney disease, stage 5: Secondary | ICD-10-CM

## 2020-08-11 DIAGNOSIS — I2510 Atherosclerotic heart disease of native coronary artery without angina pectoris: Secondary | ICD-10-CM

## 2020-08-11 DIAGNOSIS — R55 Syncope and collapse: Secondary | ICD-10-CM

## 2020-08-11 DIAGNOSIS — N189 Chronic kidney disease, unspecified: Secondary | ICD-10-CM

## 2020-08-11 LAB — ECHOCARDIOGRAM COMPLETE
AR max vel: 2.46 cm2
AV Area VTI: 2.53 cm2
AV Area mean vel: 2.36 cm2
AV Mean grad: 5 mmHg
AV Peak grad: 8.1 mmHg
Ao pk vel: 1.42 m/s
Area-P 1/2: 3.65 cm2
Height: 70.5 in
MV VTI: 2.18 cm2
S' Lateral: 2.7 cm
Weight: 3664.93 oz

## 2020-08-11 LAB — CBC
HCT: 25.4 % — ABNORMAL LOW (ref 39.0–52.0)
Hemoglobin: 8.8 g/dL — ABNORMAL LOW (ref 13.0–17.0)
MCH: 30.9 pg (ref 26.0–34.0)
MCHC: 34.6 g/dL (ref 30.0–36.0)
MCV: 89.1 fL (ref 80.0–100.0)
Platelets: 163 10*3/uL (ref 150–400)
RBC: 2.85 MIL/uL — ABNORMAL LOW (ref 4.22–5.81)
RDW: 12.8 % (ref 11.5–15.5)
WBC: 9.6 10*3/uL (ref 4.0–10.5)
nRBC: 0 % (ref 0.0–0.2)

## 2020-08-11 LAB — COMPREHENSIVE METABOLIC PANEL
ALT: 98 U/L — ABNORMAL HIGH (ref 0–44)
AST: 261 U/L — ABNORMAL HIGH (ref 15–41)
Albumin: 2 g/dL — ABNORMAL LOW (ref 3.5–5.0)
Alkaline Phosphatase: 66 U/L (ref 38–126)
Anion gap: 11 (ref 5–15)
BUN: 63 mg/dL — ABNORMAL HIGH (ref 6–20)
CO2: 20 mmol/L — ABNORMAL LOW (ref 22–32)
Calcium: 8.2 mg/dL — ABNORMAL LOW (ref 8.9–10.3)
Chloride: 99 mmol/L (ref 98–111)
Creatinine, Ser: 5.5 mg/dL — ABNORMAL HIGH (ref 0.61–1.24)
GFR, Estimated: 11 mL/min — ABNORMAL LOW (ref >60)
Glucose, Bld: 167 mg/dL — ABNORMAL HIGH (ref 70–99)
Potassium: 3.4 mmol/L — ABNORMAL LOW (ref 3.5–5.1)
Sodium: 130 mmol/L — ABNORMAL LOW (ref 135–145)
Total Bilirubin: 1 mg/dL (ref 0.3–1.2)
Total Protein: 5.2 g/dL — ABNORMAL LOW (ref 6.5–8.1)

## 2020-08-11 LAB — GLUCOSE, CAPILLARY
Glucose-Capillary: 123 mg/dL — ABNORMAL HIGH (ref 70–99)
Glucose-Capillary: 211 mg/dL — ABNORMAL HIGH (ref 70–99)
Glucose-Capillary: 233 mg/dL — ABNORMAL HIGH (ref 70–99)
Glucose-Capillary: 258 mg/dL — ABNORMAL HIGH (ref 70–99)

## 2020-08-11 LAB — HIV ANTIBODY (ROUTINE TESTING W REFLEX): HIV Screen 4th Generation wRfx: NONREACTIVE

## 2020-08-11 LAB — CK: Total CK: 2668 U/L — ABNORMAL HIGH (ref 49–397)

## 2020-08-11 MED ORDER — GABAPENTIN 300 MG PO CAPS: 300.0000 mg | ORAL_CAPSULE | Freq: Every day | ORAL | Status: DC

## 2020-08-11 MED ORDER — CARVEDILOL 25 MG PO TABS
25.0000 mg | ORAL_TABLET | Freq: Two times a day (BID) | ORAL | Status: DC
  Administered 2020-08-11 – 2020-08-12 (×3): 25 mg via ORAL
  Filled 2020-08-11 (×2): qty 1

## 2020-08-11 MED ORDER — ATORVASTATIN CALCIUM 80 MG PO TABS
80.0000 mg | ORAL_TABLET | Freq: Every day | ORAL | Status: DC
  Administered 2020-08-11 – 2020-08-12 (×2): 80 mg via ORAL
  Filled 2020-08-11 (×2): qty 1

## 2020-08-11 MED ORDER — GABAPENTIN 300 MG PO CAPS
300.0000 mg | ORAL_CAPSULE | Freq: Every day | ORAL | Status: DC
  Administered 2020-08-11: 300 mg via ORAL
  Filled 2020-08-11: qty 1

## 2020-08-11 MED ORDER — METOCLOPRAMIDE HCL 5 MG PO TABS
10.0000 mg | ORAL_TABLET | Freq: Every day | ORAL | Status: DC
  Administered 2020-08-11: 10 mg via ORAL
  Filled 2020-08-11: qty 2

## 2020-08-11 MED ORDER — METOCLOPRAMIDE HCL 5 MG PO TABS
10.0000 mg | ORAL_TABLET | Freq: Every day | ORAL | Status: DC
  Administered 2020-08-11 – 2020-08-12 (×2): 10 mg via ORAL
  Filled 2020-08-11 (×2): qty 2

## 2020-08-11 MED ORDER — GABAPENTIN 300 MG PO CAPS
300.0000 mg | ORAL_CAPSULE | Freq: Two times a day (BID) | ORAL | Status: DC
  Administered 2020-08-11: 300 mg via ORAL
  Filled 2020-08-11: qty 1

## 2020-08-11 MED ORDER — SALINE SPRAY 0.65 % NA SOLN
1.0000 | NASAL | Status: DC | PRN
  Filled 2020-08-11: qty 44

## 2020-08-11 MED ORDER — METOCLOPRAMIDE HCL 5 MG PO TABS: 10.0000 mg | ORAL_TABLET | Freq: Every day | ORAL | Status: DC

## 2020-08-11 NOTE — Evaluation (Signed)
Occupational Therapy Evaluation Patient Details Name: Chase Clark MRN: [id number] DOB: December 19, 1965 Today's Date: 08/11/2020    History of Present Illness Chase Clark is a 55 y.o. male with medical history significant for CAD s/p CABG, CKD stage V with plans to start peritoneal dialysis soon, insulin-dependent diabetes, HTN, HLD, PVD s/p left BKA, chronic neck and back pain, who is legally blind presented to [facility] ED for evaluation after a syncopal episode at home.   Clinical Impression   Pt. Was seen for OT evaluation to assess for needs. Pt. Is legally blind and ed him to [facility] so he can receive services to help him stay I and increase I at home with IADLs. Pt. Is able to preform ADLs and transfers at Mod I level. Pt. States that he feels like he is at his baseline for these tasks. Pt. States he has ad for amb but does not use. Pt. May benefit from PT to assess his need to use these devices.  Acute ot to sign off.     Follow Up Recommendations  No OT follow up    Equipment Recommendations  None recommended by OT    Recommendations for Other Services       Precautions / Restrictions Precautions Precautions: Fall Required Braces or Orthoses:  (has prosthetic leg) Restrictions Weight Bearing Restrictions: No      Mobility Bed Mobility Overal bed mobility: Modified Independent                  Transfers Overall transfer level: Needs assistance Equipment used: None Transfers: Sit to/from Stand Sit to Stand: Modified independent (Device/Increase time)              Balance Overall balance assessment: No apparent balance deficits (not formally assessed)                                         ADL either performed or assessed with clinical judgement   ADL Overall ADL's : At baseline                                             Vision Baseline Vision/History: Retinopathy;Legally  blind Patient Visual Report: No change from baseline       Perception     Praxis      Pertinent Vitals/Pain Pain Assessment: No/denies pain     Hand Dominance Right   Extremity/Trunk Assessment Upper Extremity Assessment Upper Extremity Assessment: Overall WFL for tasks assessed   Lower Extremity Assessment Lower Extremity Assessment: Defer to PT evaluation       Communication Communication Communication: No difficulties   Cognition Arousal/Alertness: Awake/alert Behavior During Therapy: WFL for tasks assessed/performed Overall Cognitive Status: Within Functional Limits for tasks assessed                                     General Comments  Pt. bandage fell off and he has an injury on it. He states he scraped it with his prostic mount and has been treating at home. Nursing notified.    Exercises     Shoulder Instructions      Home Living Family/patient expects  to be discharged to:: Private residence Living Arrangements: Spouse/significant other Available Help at Discharge: Available 24 hours/day Type of Home: Apartment Home Access: Level entry     Home Layout: One level     Bathroom Shower/Tub: Teacher, early years/pre: [place]: Toilet riser;Shower seat;Cane - single point;Walker - 2 wheels (Pt. states he has walker and canes but does not use)          Prior Functioning/Environment Level of Independence: Independent with assistive device(s)                 OT Problem List:        OT Treatment/Interventions:      OT Goals(Current goals can be found in the care plan section) Acute Rehab OT Goals Patient Stated Goal: feel better and go home  OT Frequency:     Barriers to D/C:            Co-evaluation              AM-PAC OT "6 Clicks" Daily Activity     Outcome Measure Help from another person eating meals?: None Help from another person taking care of personal grooming?: None Help  from another person toileting, which includes using toliet, bedpan, or urinal?: None Help from another person bathing (including washing, rinsing, drying)?: None Help from another person to put on and taking off regular upper body clothing?: None Help from another person to put on and taking off regular lower body clothing?: None 6 Click Score: 24   End of Session Nurse Communication:  (wound on leg that pt. had at home bandage fell off.)  Activity Tolerance: Patient tolerated treatment well Patient left: in bed;with call bell/phone within reach  OT Visit Diagnosis: Muscle weakness (generalized) (M62.81)                Time: 7106-2694 OT Time Calculation (min): 33 min Charges:  OT General Charges $OT Visit: 1 Visit OT Evaluation $OT Eval Moderate Complexity: 1 Mod  Simrat Kendrick OT/L   [place] 08/11/2020, 10:23 AM

## 2020-08-11 NOTE — Progress Notes (Signed)
New Admission Note:   Arrival Method:  [facility]ia [facility] - Patient is from home with his wife Mental Orientation:  A & O x 4 Telemetry: NSR on Box [id number] Assessment: Completed Skin:  See Assessment IV:  Lt AC Pain: Denies Tubes:  N/A Safety Measures: Safety Fall Prevention Plan has been given, discussed and signed Admission: Completed 5 MW Orientation: Patient has been orientated to the room, unit and staff.  Family:  From home with wife  Patient has been advised of valuables policy.  He has his wedding ring, Prosthesis, and stump shrinker.  Patient is legally blind but did not want soft touch bell.  He can see shadows.  Orders have been reviewed and implemented. Will continue to monitor the patient. Call light has been placed within reach and bed alarm has been activated.   Earleen Reaper RN- BC, [facility] Phone number: [phone]

## 2020-08-11 NOTE — Progress Notes (Signed)
   08/11/20 0127  Provider Notification  Provider Name/Title Dr. Olevia Bowens  Date Provider Notified 08/11/20  Time Provider Notified 0127  Notification Type Page  Notification Reason Other (Comment) (Order clarification on Reglan)  Provider response See new orders  Date of Provider Response 08/11/20   Patient is requesting that his home dose of Reglan and Gabapentin be ordered beginning tonight.  Order for obtained and administered to patient.  Will continue to monitor patient.  Earleen Reaper RN

## 2020-08-11 NOTE — Progress Notes (Signed)
PROGRESS NOTE    Chase Clark  XBD:[id number] DOB: Nov 18, 1965 DOA: 08/10/2020 PCP: [name], MD  Brief Narrative: Chase Clark is a 55 y.o. male with medical history significant for CAD s/p CABG, CKD stage V with plans to start peritoneal dialysis soon, insulin-dependent diabetes, HTN, HLD, PVD s/p left BKA, chronic neck and back pain, who is legally blind presented to [facility] ED for evaluation after a syncopal episode at home. -About 1 month ago he was started on clonidine patch for management of uncontrolled hypertension.  -4/29 after getting out of the shower he felt very weak/lightheaded.  His spouse was helping him ambulate when he had syncopal versus near syncopal episodes.  He was brought to the ED for further evaluation. -He was hypotensive with blood pressure of 90 when EMS arrived, labs were notable for chronic kidney disease, potassium was normal at 3.9, BUN was 66 with creatinine of 5.6, chest x-ray and EKG were unremarkable  Assessment & Plan:   Syncope Orthostatic hypotension -Secondary to dehydration from recent GI losses and antihypertensives -Discontinued clonidine patch and lisinopril -Received 500 mL of IV fluids overnight -Restart Coreg, resume hydralazine tomorrow -Monitor on telemetry, 2D echocardiogram -PT eval today  CKD stage V: Follows with nephrology, Dr. Candiss Norse.  Has plans for peritoneal dialysis catheter placement on 5/19.  -No indication for urgent HD needs at this time  Peripheral vascular disease Left BKA  Anemia of chronic kidney disease: Hemoglobin 8.7 on admission. -Trend  Leukocytosis: -Likely reactive, improving  Hyponatremia: Mild and likely hypovolemic from GI losses.    Elevated LFTs: -Likely secondary to hypotension, improving, bilirubin is normal -Ultrasound was unremarkable  CAD s/p CABG: Chronic and stable.  Minimal troponin elevation likely demand ischemia from hypotension.  Continue aspirin.     Insulin-dependent diabetes: -CBGs are stable, sliding scale insulin  Hypertension: Holding home antihypertensives due to orthostasis.  Hyperlipidemia: Statin on hold due to elevated LFTs.  PVD s/p left BKA: Noted.  DVT prophylaxis: Subcutaneous heparin Code Status: Full code, confirmed with patient Family Communication:  No family at bedside, called and updated spouse Disposition Plan:  Home hopefully tomorrow Status is: Observation  The patient will require care spanning > 2 midnights and should be moved to inpatient because: Inpatient level of care appropriate due to severity of illness  Dispo: The patient is from: Home              Anticipated d/c is to: Home              Patient currently is not medically stable to d/c.   Difficult to place patient No    Procedures:   Antimicrobials:    Subjective: -Feels a bit tired, no other events overnight, denies any dizziness or lightheadedness this morning, has chronic fatigue, intermittent nausea and poor appetite  Objective: Vitals:   08/11/20 0152 08/11/20 0500 08/11/20 0619 08/11/20 0909  BP: 114/74  102/70 (!) 126/56  Pulse: 79  77 77  Resp: 19  17 18   Temp: 99.7 F (37.6 C)  99.8 F (37.7 C) 98.9 F (37.2 C)  TempSrc: Oral  Oral Oral  SpO2: 97%  96% 96%  Weight:  103.9 kg    Height:  5' 10.5" (1.791 m)      Intake/Output Summary (Last 24 hours) at 08/11/2020 1204 Last data filed at 08/11/2020 0600 Gross per 24 hour  Intake 240 ml  Output 525 ml  Net -285 ml   [employer]  08/10/20 1333 08/10/20 2056 08/11/20 0500  Weight: 108.6 kg 103.9 kg 103.9 kg    Examination:  General exam: Appears calm and comfortable  Respiratory system: Clear to auscultation. Respiratory effort normal. Cardiovascular system: S1 & S2 heard, RRR. No JVD, murmurs, rubs, gallops Gastrointestinal system: Abdomen is nondistended, soft and nontender.Normal bowel sounds heard. Central nervous system: Alert and oriented. No  focal neurological deficits. Extremities: Symmetric 5 x 5 power. Skin: No rashes, lesions or ulcers Psychiatry: Judgement and insight appear normal. Mood & affect appropriate.     Data Reviewed:   CBC: Recent Labs  Lab 08/10/20 1440 08/11/20 0334  WBC 14.5* 9.6  NEUTROABS 12.0*  --   HGB 8.7* 8.8*  HCT 25.8* 25.4*  MCV 91.5 89.1  PLT 177 829   Basic Metabolic Panel: Recent Labs  Lab 08/10/20 1440 08/11/20 0334  NA 128* 130*  K 3.9 3.4*  CL 97* 99  CO2 19* 20*  GLUCOSE 135* 167*  BUN 66* 63*  CREATININE 5.65* 5.50*  CALCIUM 8.4* 8.2*   GFR: Estimated Creatinine Clearance: 18 mL/min (A) (by C-G formula based on SCr of 5.5 mg/dL (H)). Liver Function Tests: Recent Labs  Lab 08/10/20 1440 08/11/20 0334  AST 324* 261*  ALT 104* 98*  ALKPHOS 93 66  BILITOT 0.8 1.0  PROT 6.3* 5.2*  ALBUMIN 2.5* 2.0*   No results for input(s): LIPASE, AMYLASE in the last 168 hours. No results for input(s): AMMONIA in the last 168 hours. Coagulation Profile: No results for input(s): INR, PROTIME in the last 168 hours. Cardiac Enzymes: Recent Labs  Lab 08/11/20 0334  CKTOTAL 2,668*   BNP (last 3 results) No results for input(s): PROBNP in the last 8760 hours. HbA1C: No results for input(s): HGBA1C in the last 72 hours. CBG: Recent Labs  Lab 08/10/20 2333 08/11/20 0710  GLUCAP 106* 123*   Lipid Profile: No results for input(s): CHOL, HDL, LDLCALC, TRIG, CHOLHDL, LDLDIRECT in the last 72 hours. Thyroid Function Tests: No results for input(s): TSH, T4TOTAL, FREET4, T3FREE, THYROIDAB in the last 72 hours. Anemia Panel: No results for input(s): VITAMINB12, FOLATE, FERRITIN, TIBC, IRON, RETICCTPCT in the last 72 hours. Urine analysis:    Component Value Date/Time   COLORURINE YELLOW 08/10/2020 1615   APPEARANCEUR CLOUDY (A) 08/10/2020 1615   LABSPEC 1.020 08/10/2020 1615   PHURINE 5.5 08/10/2020 1615   GLUCOSEU 250 (A) 08/10/2020 1615   HGBUR LARGE (A) 08/10/2020  1615   BILIRUBINUR NEGATIVE 08/10/2020 1615   KETONESUR NEGATIVE 08/10/2020 1615   PROTEINUR >300 (A) 08/10/2020 1615   UROBILINOGEN 0.2 10/22/2006 0948   NITRITE NEGATIVE 08/10/2020 1615   LEUKOCYTESUR NEGATIVE 08/10/2020 1615   Sepsis Labs: @LABRCNTIP (procalcitonin:4,lacticidven:4)  ) Recent Results (from the past 240 hour(s))  Resp Panel by RT-PCR (Flu A&B, Covid) Nasopharyngeal Swab     Status: None   Collection Time: 08/10/20  6:09 PM   Specimen: Nasopharyngeal Swab; Nasopharyngeal(NP) swabs in vial transport medium  Result Value Ref Range Status   SARS Coronavirus 2 by RT PCR NEGATIVE NEGATIVE Final    Comment: (NOTE) SARS-CoV-2 target nucleic acids are NOT DETECTED.  The SARS-CoV-2 RNA is generally detectable in upper respiratory specimens during the acute phase of infection. The lowest concentration of SARS-CoV-2 viral copies this assay can detect is 138 copies/mL. A negative result does not preclude SARS-Cov-2 infection and should not be used as the sole basis for treatment or other patient management decisions. A negative result may occur with  improper specimen collection/handling,  submission of specimen other than nasopharyngeal swab, presence of viral mutation(s) within the areas targeted by this assay, and inadequate number of viral copies(<138 copies/mL). A negative result must be combined with clinical observations, patient history, and epidemiological information. The expected result is Negative.  Fact Sheet for Patients:  EntrepreneurPulse.com.au  Fact Sheet for Healthcare Providers:  IncredibleEmployment.be  This test is no t yet approved or cleared by the [place] FDA and  has been authorized for detection and/or diagnosis of SARS-CoV-2 by FDA under an Emergency Use Authorization (EUA). This EUA will remain  in effect (meaning this test can be used) for the duration of the COVID-19 declaration under Section  564(b)(1) of the Act, 21 U.S.C.section 360bbb-3(b)(1), unless the authorization is terminated  or revoked sooner.       Influenza A by PCR NEGATIVE NEGATIVE Final   Influenza B by PCR NEGATIVE NEGATIVE Final    Comment: (NOTE) The Xpert Xpress SARS-CoV-2/FLU/RSV plus assay is intended as an aid in the diagnosis of influenza from Nasopharyngeal swab specimens and should not be used as a sole basis for treatment. Nasal washings and aspirates are unacceptable for Xpert Xpress SARS-CoV-2/FLU/RSV testing.  Fact Sheet for Patients: EntrepreneurPulse.com.au  Fact Sheet for Healthcare Providers: IncredibleEmployment.be  This test is not yet approved or cleared by the [place] FDA and has been authorized for detection and/or diagnosis of SARS-CoV-2 by FDA under an Emergency Use Authorization (EUA). This EUA will remain in effect (meaning this test can be used) for the duration of the COVID-19 declaration under Section 564(b)(1) of the Act, 21 U.S.C. section 360bbb-3(b)(1), unless the authorization is terminated or revoked.  Performed at [facility], [address] [place], [place] [address]          Radiology Studies: DG Chest [place] 1 View  Result Date: 08/10/2020 CLINICAL DATA:  Loss of consciousness. EXAM: PORTABLE CHEST 1 VIEW COMPARISON:  July 31, 2020 FINDINGS: Multiple sternal wires and vascular clips are seen. The heart size and mediastinal contours are within normal limits. Both lungs are clear. A radiopaque fusion plate and screws are seen overlying the lower cervical spine. IMPRESSION: 1. Evidence of prior median sternotomy/CABG. 2. No acute or active cardiopulmonary disease. Electronically Signed   By: Virgina Norfolk M.D.   On: 08/10/2020 20:37   US Abdomen Limited RUQ (LIVER/GB)  Result Date: 08/10/2020 CLINICAL DATA:  Elevated LFTs EXAM: ULTRASOUND ABDOMEN LIMITED RIGHT UPPER QUADRANT COMPARISON:  06/08/2020  FINDINGS: Gallbladder: Patient is status post prior cholecystectomy. Common bile duct: Diameter: 4 mm Liver: No focal lesion identified. Within normal limits in parenchymal echogenicity. Portal vein is patent on color Doppler imaging with normal direction of blood flow towards the liver. Other: None. IMPRESSION: Status post cholecystectomy.  No acute abnormality. Electronically Signed   By: Constance Holster M.D.   On: 08/10/2020 23:56        Scheduled Meds: . aspirin  81 mg Oral Daily  . atorvastatin  80 mg Oral Daily  . carvedilol  25 mg Oral BID WC  . dorzolamide  1 drop Both Eyes BID  . gabapentin  300 mg Oral Daily  . heparin  5,000 Units Subcutaneous Q8H  . insulin aspart  0-6 Units Subcutaneous TID WC  . latanoprost  1 drop Both Eyes QHS  . metoCLOPramide  10 mg Oral Daily  . sodium chloride flush  3 mL Intravenous Q12H   Continuous Infusions:   LOS: 0 days    Time spent: 32min  Domenic Polite, MD [facility]  08/11/2020, 12:04 PM

## 2020-08-11 NOTE — Progress Notes (Signed)
  Echocardiogram 2D Echocardiogram has been performed.  Chase Clark 08/11/2020, 1:40 PM

## 2020-08-11 NOTE — Evaluation (Signed)
Physical Therapy Evaluation Patient Details Name: Chase Clark MRN: [id number] DOB: [birth year] Today's Date: 08/11/2020   History of Present Illness  Chase Clark is a 55 y.o. male with medical history significant for CAD s/p CABG, CKD stage V with plans to start peritoneal dialysis soon, insulin-dependent diabetes, HTN, HLD, PVD s/p left BKA, chronic neck and back pain, who is legally blind presented to [facility] ED for evaluation after a syncopal episode at home.  Clinical Impression  Patient required supervision for balance with ambulation and transfers. He reports this is different from baseline. He reports feeling weak. He would benefit form further skilled therapy to improve general stability with gait and transfers. He will likely not require follow up therapy at discharge.     Follow Up Recommendations No PT follow up    Equipment Recommendations  None recommended by PT    Recommendations for Other Services       Precautions / Restrictions Precautions Precautions: Fall Precaution Comments: had syncopal epsiode with his wife but no fall per patient. Restrictions Weight Bearing Restrictions: No      Mobility  Bed Mobility Overal bed mobility: Modified Independent                  Transfers Overall transfer level: Needs assistance Equipment used: None Transfers: Sit to/from Stand Sit to Stand: Supervision         General transfer comment: supervision for intial balance  Ambulation/Gait Ambulation/Gait assistance: Supervision Gait Distance (Feet): 75 Feet Assistive device: None Gait Pattern/deviations: Step-through pattern Gait velocity: decreased Gait velocity interpretation: <1.31 ft/sec, indicative of household ambulator General Gait Details: slight decrease in stability laterally but no loss of balance. he reports he feels like his balance is off compared to baseline but he is feeling better then he did/ He was encoruaged to use his  cane at home.  Stairs            Wheelchair Mobility    Modified Rankin (Stroke Patients Only)       Balance Overall balance assessment: Needs assistance Sitting-balance support: No upper extremity supported;Feet unsupported Sitting balance-Leahy Scale: Good     Standing balance support: No upper extremity supported Standing balance-Leahy Scale: Fair Standing balance comment: mild balance deficits noted standing and walking                             Pertinent Vitals/Pain Pain Assessment: No/denies pain    Home Living Family/patient expects to be discharged to:: Private residence Living Arrangements: Spouse/significant other Available Help at Discharge: Available 24 hours/day Type of Home: Apartment Home Access: Level entry     Home Layout: One level        Prior Function Level of Independence: Independent with assistive device(s)               Hand Dominance   Dominant Hand: Right    Extremity/Trunk Assessment   Upper Extremity Assessment Upper Extremity Assessment: Defer to OT evaluation    Lower Extremity Assessment Lower Extremity Assessment: Generalized weakness       Communication   Communication: No difficulties  Cognition Arousal/Alertness: Awake/alert Behavior During Therapy: WFL for tasks assessed/performed Overall Cognitive Status: Within Functional Limits for tasks assessed  General Comments      Exercises     Assessment/Plan    PT Assessment Patient needs continued PT services  PT Problem List Decreased strength;Decreased range of motion;Decreased activity tolerance;Decreased mobility;Decreased knowledge of use of DME       PT Treatment Interventions DME instruction;Gait training;Functional mobility training;Therapeutic activities;Therapeutic exercise;Neuromuscular re-education;Patient/family education    PT Goals (Current goals can be found in the  Care Plan section)  Acute Rehab PT Goals Patient Stated Goal: feel better and go home PT Goal Formulation: With patient Time For Goal Achievement: 08/18/20 Potential to Achieve Goals: Good    Frequency Min 2X/week   Barriers to discharge        Co-evaluation               AM-PAC PT "6 Clicks" Mobility  Outcome Measure Help needed turning from your back to your side while in a flat bed without using bedrails?: None Help needed moving from lying on your back to sitting on the side of a flat bed without using bedrails?: None Help needed moving to and from a bed to a chair (including a wheelchair)?: A Little Help needed standing up from a chair using your arms (e.g., wheelchair or bedside chair)?: A Little Help needed to walk in hospital room?: A Little Help needed climbing 3-5 steps with a railing? : A Little 6 Click Score: 20    End of Session Equipment Utilized During Treatment: Gait belt Activity Tolerance: Patient tolerated treatment well Patient left: in bed;with call bell/phone within reach (declined to sit in chair)   PT Visit Diagnosis: Unsteadiness on feet (R26.81);Muscle weakness (generalized) (M62.81);Repeated falls (R29.6)    Time: 3354-5625 PT Time Calculation (min) (ACUTE ONLY): 17 min   Charges:   PT Evaluation $PT Eval Moderate Complexity: 1 Mod           Carney Living PT DPT  08/11/2020, 4:05 PM

## 2020-08-12 DIAGNOSIS — N185 Chronic kidney disease, stage 5: Secondary | ICD-10-CM | POA: Diagnosis not present

## 2020-08-12 DIAGNOSIS — D638 Anemia in other chronic diseases classified elsewhere: Secondary | ICD-10-CM

## 2020-08-12 DIAGNOSIS — R55 Syncope and collapse: Secondary | ICD-10-CM | POA: Diagnosis not present

## 2020-08-12 LAB — GLUCOSE, CAPILLARY: Glucose-Capillary: 259 mg/dL — ABNORMAL HIGH (ref 70–99)

## 2020-08-12 MED ORDER — HYDRALAZINE HCL 25 MG PO TABS
12.5000 mg | ORAL_TABLET | Freq: Two times a day (BID) | ORAL | Status: DC
  Administered 2020-08-12: 12.5 mg via ORAL
  Filled 2020-08-12: qty 1

## 2020-08-12 MED ORDER — HYDRALAZINE HCL 25 MG PO TABS: 25.0000 mg | ORAL_TABLET | Freq: Two times a day (BID) | ORAL | Status: DC

## 2020-08-12 MED ORDER — INSULIN GLARGINE 100 UNIT/ML ~~LOC~~ SOLN
30.0000 [IU] | Freq: Two times a day (BID) | SUBCUTANEOUS | Status: DC
  Filled 2020-08-12 (×2): qty 0.3

## 2020-08-12 MED ORDER — INSULIN GLARGINE 100 UNIT/ML ~~LOC~~ SOLN
35.0000 [IU] | Freq: Two times a day (BID) | SUBCUTANEOUS | Status: DC
  Filled 2020-08-12 (×2): qty 0.35

## 2020-08-12 MED ORDER — GABAPENTIN 300 MG PO CAPS: 300.0000 mg | ORAL_CAPSULE | Freq: Every day | ORAL | 6 refills | Status: DC

## 2020-08-12 MED ORDER — INSULIN DEGLUDEC 100 UNIT/ML ~~LOC~~ SOPN: 30.0000 [IU] | PEN_INJECTOR | Freq: Two times a day (BID) | SUBCUTANEOUS | Status: DC

## 2020-08-12 MED ORDER — INSULIN DEGLUDEC 100 UNIT/ML ~~LOC~~ SOPN: 35.0000 [IU] | PEN_INJECTOR | Freq: Two times a day (BID) | SUBCUTANEOUS | Status: DC

## 2020-08-12 MED ORDER — TRESIBA FLEXTOUCH 200 UNIT/ML ~~LOC~~ SOPN: 35.0000 [IU] | PEN_INJECTOR | Freq: Two times a day (BID) | SUBCUTANEOUS | Status: DC

## 2020-08-12 NOTE — Plan of Care (Signed)
  Problem: Health Behavior/Discharge Planning: Goal: Ability to manage health-related needs will improve Outcome: Adequate for Discharge   Problem: Clinical Measurements: Goal: Ability to maintain clinical measurements within normal limits will improve Outcome: Adequate for Discharge Goal: Will remain free from infection Outcome: Adequate for Discharge Goal: Diagnostic test results will improve Outcome: Adequate for Discharge Goal: Respiratory complications will improve Outcome: Adequate for Discharge Goal: Cardiovascular complication will be avoided Outcome: Adequate for Discharge   Problem: Activity: Goal: Risk for activity intolerance will decrease Outcome: Adequate for Discharge   Problem: Nutrition: Goal: Adequate nutrition will be maintained Outcome: Adequate for Discharge   Problem: Coping: Goal: Level of anxiety will decrease Outcome: Adequate for Discharge   Problem: Elimination: Goal: Will not experience complications related to bowel motility Outcome: Adequate for Discharge Goal: Will not experience complications related to urinary retention Outcome: Adequate for Discharge   Problem: Pain Managment: Goal: General experience of comfort will improve Outcome: Adequate for Discharge   Problem: Safety: Goal: Ability to remain free from injury will improve Outcome: Adequate for Discharge   Problem: Skin Integrity: Goal: Risk for impaired skin integrity will decrease Outcome: Adequate for Discharge   Problem: Education: Goal: Knowledge of disease and its progression will improve Outcome: Adequate for Discharge Goal: Individualized Educational Video(s) Outcome: Adequate for Discharge   Problem: Fluid Volume: Goal: Compliance with measures to maintain balanced fluid volume will improve Outcome: Adequate for Discharge   Problem: Health Behavior/Discharge Planning: Goal: Ability to manage health-related needs will improve Outcome: Adequate for Discharge    Problem: Nutritional: Goal: Ability to make healthy dietary choices will improve Outcome: Adequate for Discharge   Problem: Clinical Measurements: Goal: Complications related to the disease process, condition or treatment will be avoided or minimized Outcome: Adequate for Discharge

## 2020-08-13 ENCOUNTER — Other Ambulatory Visit: Payer: Self-pay | Admitting: Family Medicine

## 2020-08-14 ENCOUNTER — Telehealth: Payer: Self-pay

## 2020-08-14 NOTE — Telephone Encounter (Signed)
Received Cardiac Clearance from Dr. Ubaldo Glassing. Pt is optimized to have surgery at low risk assessment.

## 2020-08-17 ENCOUNTER — Encounter
Admission: RE | Admit: 2020-08-17 | Discharge: 2020-08-17 | Disposition: A | Discharge status: Home / Self Care | Payer: Managed Care, Other (non HMO) | Source: Ambulatory Visit | Attending: Surgery | Admitting: Surgery

## 2020-08-17 ENCOUNTER — Other Ambulatory Visit: Payer: Self-pay

## 2020-08-17 HISTORY — DX: Anemia, unspecified: D64.9

## 2020-08-17 HISTORY — DX: Other specified postprocedural states: Z98.890

## 2020-08-17 HISTORY — DX: Nausea with vomiting, unspecified: R11.2

## 2020-08-17 NOTE — Patient Instructions (Signed)
Your procedure is scheduled on: 08/30/20 Report to [facility]. (check in at admitting desk before going to 2nd floor) To find out your arrival time please call [phone] between 1PM - 3PM on 08/29/20.  Remember: Instructions that are not followed completely may result in serious medical risk, up to and including death, or upon the discretion of your surgeon and anesthesiologist your surgery may need to be rescheduled.     _X__ 1. Do not eat food after midnight the night before your procedure.                 No gum chewing or hard candies. You may drink clear liquids up to 2 hours                 before you are scheduled to arrive for your surgery-                  Diabetics water only  __X__2.  On the morning of surgery brush your teeth with toothpaste and water, you                 may rinse your mouth with mouthwash if you wish.  Do not swallow any              toothpaste of mouthwash.     _X__ 3.  No Alcohol for 24 hours before or after surgery.   _X__ 4.  Do Not Smoke or use e-cigarettes For 24 Hours Prior to Your Surgery.                 Do not use any chewable tobacco products for at least 6 hours prior to                 surgery.  ____  5.  Bring all medications with you on the day of surgery if instructed.   __X__  6.  Notify your doctor if there is any change in your medical condition      (cold, fever, infections).     Do not wear jewelry, make-up, hairpins, clips or nail polish. Do not wear lotions, powders, or perfumes deodorant Do not shave body hair 48 hours prior to surgery. Men may shave face and neck. Do not bring valuables to the hospital.    [facility] is not responsible for any belongings or valuables.  Contacts, dentures/partials or body piercings may not be worn into surgery. Bring a case for your contacts, glasses or hearing aids, a denture cup will be supplied. Leave your suitcase in the car. After  surgery it may be brought to your room. For patients admitted to the hospital, discharge time is determined by your treatment team.   Patients discharged the day of surgery will not be allowed to drive home.   Please read over the following fact sheets that you were given:     __X__ Take these medicines the morning of surgery with A SIP OF WATER:    1. carvedilol (COREG) 25 MG tablet  2. esomeprazole (NEXIUM) 20 MG capsule  3. hydrALAZINE (APRESOLINE) 25 MG tablet  4.  5.  6. (may use eye drops)  ____ Fleet Enema (as directed)   __X__ Use CHG Soap or Hibiclens. Shower using soap the night before surgery and the morning of surgery. Do not use CHG/Hibiclens on head, face or genitals. Use your normal bath soap on those areas first then wash with CHG/Hibiclens  from neck to toes.  ____ Use inhalers on the day of surgery  ____ Stop metformin/Janumet/Farxiga 2 days prior to surgery    __X__ Take 1/2 of usual insulin dose the night before surgery. No insulin the morning          of surgery.   ____ Stop Blood Thinners Coumadin/Plavix/Xarelto/Pleta/Pradaxa/Eliquis/Effient/Aspirin  on   Or contact your Surgeon, Cardiologist or Medical Doctor regarding  ability to stop your blood thinners  __X__ Stop Anti-inflammatories 7 days before surgery such as Advil, Ibuprofen, Motrin,  BC or Goodies Powder, Naprosyn, Naproxen, Aleve, Aspirin    HOLD ASPIRIN STARTING ON 08/24/20  __X__ Stop all herbal supplements, fish oil or vitamin E until after surgery.    ____ Bring C-Pap to the hospital.

## 2020-08-24 ENCOUNTER — Encounter: Payer: Self-pay | Admitting: *Deleted

## 2020-08-24 NOTE — Discharge Summary (Signed)
Physician Discharge Summary  ANGELLO CHIEN BMW:[id number] DOB: October 22, 1965 DOA: 08/10/2020  PCP: [name], MD  Admit date: 08/10/2020 Discharge date: 08/12/2020  Time spent: 35 minutes  Recommendations for Outpatient Follow-up:  Nephrologist Dr. Candiss Norse in 1 to 2 weeks Closely monitor blood pressure and volume status  Discharge Diagnoses:  Principal Problem:   Syncope Orthostatic hypotension   Hyperlipidemia associated with type 2 diabetes mellitus ([place])   Hypertension associated with diabetes ([place])   Hyponatremia   Coronary artery disease involving native coronary artery of native heart without angina pectoris   Anemia of chronic disease   CKD (chronic kidney disease), stage V ([facility])   Elevated LFTs   Discharge Condition: Stable  Diet recommendation: Renal  Filed Weights   08/10/20 1333 08/10/20 2056 08/11/20 0500  Weight: 108.6 kg 103.9 kg 103.9 kg    History of present illness:  Jasman Murri Johnsonis a 55 y.o.malewith medical history significant forCAD s/p CABG, CKD stage V with plans to start peritoneal dialysis soon, insulin-dependent diabetes, HTN, HLD, PVD s/p left BKA, chronic neck and back pain, who is legally blind presented to [facility] ED for evaluation after a syncopal episode at home. -About 1 month ago he was started on clonidine patch for management of uncontrolled hypertension.  -4/29 after getting out of the shower he felt very weak/lightheaded. His spouse was helping him ambulate when he had syncopal versus near syncopal episodes. He was brought to the ED for further evaluation. -He was hypotensive with blood pressure of 90 when EMS arrived, labs were notable for chronic kidney disease, potassium was normal at 3.9, BUN was 66 with creatinine of 5.6, chest x-ray and EKG were unremarkable   Hospital Course:   Syncope Orthostatic hypotension -Secondary to dehydration from recent GI losses and antihypertensives, recently started on  clonidine -Discontinued clonidine patch and lisinopril -Received 500 mL of IV fluids night of admission -Restarted Coreg and hydralazine, hydralazine dose increased to 25 mg twice daily, volume status remained stable -He had no events on telemetry, 2D echo noted EF of 65% with moderate LVH, no significant valvular disease -PT eval completed, ambulated independently without issues, discharged home with spouse in a stable condition, advised spouse to monitor his blood pressure closely and follow-up with nephrologist Dr. Candiss Norse in 1 to 2 weeks  CKD stage V: Follows with nephrology, Dr. Candiss Norse. Has plans for peritoneal dialysis catheter placement on 5/19.  -No indication for urgent HD needs at this time  Peripheral vascular disease Left BKA  Anemia of chronic kidney disease: Hemoglobin 8.7 on admission. -Stable  Leukocytosis: -Reactive, resolved  Hyponatremia: Mild and likely hypovolemic from GI losses.   Elevated LFTs: -Likely secondary to hypotension, improving, bilirubin is normal -Ultrasound was unremarkable  CAD s/p CABG: Chronic and stable. Minimal troponin elevation likely demand ischemia from hypotension. Continue aspirin.   Insulin-dependent diabetes: -CBGs are stable, sliding scale insulin  Hypertension: Antihypertensives held on admission due to positive orthostatic vitals, at discharge hydralazine and Coreg were resumed, lisinopril and clonidine were discontinued   PVD s/p left UVO:[id number].  Discharge Exam: Vitals:   08/11/20 2025 08/12/20 0533  BP: 136/72 (!) 143/70  Pulse: 75 68  Resp: 18 16  Temp: 98.7 F (37.1 C) 98.2 F (36.8 C)  SpO2: 99% 98%    General: AAOx3 Cardiovascular: S1-S2, regular rate rhythm Respiratory: Clear  Discharge Instructions   Discharge Instructions    Discharge instructions   Complete by: As directed    Renal Carb modified  Discharge wound care:   Complete by: As directed    routine   Increase activity  slowly   Complete by: As directed      Allergies as of 08/12/2020   No Known Allergies     Medication List    STOP taking these medications   cloNIDine 0.1 mg/24hr patch Commonly known as: CATAPRES - Dosed in mg/24 hr   gabapentin 300 MG capsule Commonly known as: NEURONTIN   lisinopril 5 MG tablet Commonly known as: ZESTRIL     TAKE these medications   acetaminophen 325 MG tablet Commonly known as: TYLENOL Take 1 tablet (325 mg total) by mouth every 6 (six) hours as needed for mild pain (pain score 1-3 or temp > 100.5). What changed: how much to take   aspirin 81 MG chewable tablet Commonly known as: Aspirin Childrens Chew 1 tablet (81 mg total) by mouth daily.   atorvastatin 80 MG tablet Commonly known as: LIPITOR TAKE 1 TABLET BY MOUTH EVERY DAY What changed: when to take this   brimonidine-timolol 0.2-0.5 % ophthalmic solution Commonly known as: COMBIGAN Place 1 drop into the right eye every 12 (twelve) hours.   carvedilol 25 MG tablet Commonly known as: COREG TAKE 1 TABLET (25 MG TOTAL) BY MOUTH 2 (TWO) TIMES DAILY WITH A MEAL.   cholecalciferol 25 MCG (1000 UNIT) tablet Commonly known as: VITAMIN D3 Take 2,000 Units by mouth daily.   Dexcom G6 Sensor Misc Inject 1 Device into the skin as directed. Place a new sensor every 10 days to check blood sugar at least 4 times daily   Dexcom G6 Transmitter Misc Inject 1 Device into the skin as directed. Use to check blood sugar at least 4 times daily   dorzolamide 2 % ophthalmic solution Commonly known as: TRUSOPT Place 1 drop into both eyes 2 (two) times daily.   esomeprazole 20 MG capsule Commonly known as: NEXIUM Take 20 mg by mouth daily.   ezetimibe 10 MG tablet Commonly known as: Zetia Take 1 tablet (10 mg total) by mouth daily. What changed: when to take this   fluticasone 50 MCG/ACT nasal spray Commonly known as: FLONASE SPRAY 2 SPRAYS INTO EACH NOSTRIL EVERY DAY What changed: See the new  instructions.   hydrALAZINE 25 MG tablet Commonly known as: APRESOLINE Take 1 tablet (25 mg total) by mouth 2 (two) times daily. What changed:   how much to take  when to take this  reasons to take this   insulin lispro 100 UNIT/ML KwikPen Commonly known as: HumaLOG KwikPen Inject 6 Units into the skin 3 (three) times daily. Inject up to 15 units TID with meals. Further refills to come from endocrinology What changed:   how much to take  when to take this  additional instructions   Insulin Pen Needle 32G X 4 MM Misc Inject 5 pens into the skin daily. Use to inject Lantus and Novolog up to 5 times daily   Lactulose 20 GM/30ML Soln Take 30 mLs (20 g total) by mouth as needed. What changed:   when to take this  reasons to take this   latanoprost 0.005 % ophthalmic solution Commonly known as: XALATAN Place 1 drop into both eyes at bedtime.   metoCLOPramide 10 MG tablet Commonly known as: REGLAN TAKE 1 TABLET BY MOUTH EVERYDAY AT BEDTIME What changed: See the new instructions.   multivitamin with minerals Tabs tablet Take 1 tablet by mouth daily.   senna 8.6 MG tablet Commonly known as:  SENOKOT Take 2 tablets by mouth daily as needed for constipation.   torsemide 20 MG tablet Commonly known as: DEMADEX Take 20 mg by mouth daily.   Tyler Aas FlexTouch 200 UNIT/ML FlexTouch Pen Generic drug: insulin degludec Inject 36 Units into the skin 2 (two) times daily. What changed: how much to take            Discharge Care Instructions  (From admission, onward)         Start     Ordered   08/12/20 0000  Discharge wound care:       Comments: routine   08/12/20 0851         No Known Allergies  Follow-up Information    [name], MD. Schedule an appointment as soon as possible for a visit in 1 week(s).   Specialty: Family Medicine Contact information: [address] [place] [address] [phone]                The  results of significant diagnostics from this hospitalization (including imaging, microbiology, ancillary and laboratory) are listed below for reference.    Significant Diagnostic Studies: DG Chest Port 1 View  Result Date: 08/10/2020 CLINICAL DATA:  Loss of consciousness. EXAM: PORTABLE CHEST 1 VIEW COMPARISON:  July 31, 2020 FINDINGS: Multiple sternal wires and vascular clips are seen. The heart size and mediastinal contours are within normal limits. Both lungs are clear. A radiopaque fusion plate and screws are seen overlying the lower cervical spine. IMPRESSION: 1. Evidence of prior median sternotomy/CABG. 2. No acute or active cardiopulmonary disease. Electronically Signed   By: Virgina Norfolk M.D.   On: 08/10/2020 20:37   ECHOCARDIOGRAM COMPLETE  Result Date: 08/11/2020    ECHOCARDIOGRAM REPORT   Patient Name:   [name] Date of Exam: 08/11/2020 Medical Rec #:  [id number]       Height:       70.5 in Accession #:    [id number]      Weight:       229.1 lb Date of Birth:  [birth year]       BSA:          2.223 m Patient Age:    40 years        BP:           126/56 mmHg Patient Gender: M               HR:           77 bpm. Exam Location:  Inpatient Procedure: 2D Echo, Cardiac Doppler and Color Doppler Indications:    Syncope  History:        Patient has prior history of Echocardiogram examinations. CAD;                 Risk Factors:Hypertension, Diabetes and Dyslipidemia. GERD. S/P                 CABG (01/2019). CKD. PVD.  Sonographer:    Clayton Lefort RDCS (AE) Referring Phys: [place]  1. Left ventricular ejection fraction, by estimation, is 65 to 70%. The left ventricle has normal function. The left ventricle has no regional wall motion abnormalities. There is moderate left ventricular hypertrophy. Left ventricular diastolic parameters were normal.  2. Right ventricular systolic function is normal. The right ventricular size is normal.  3. The mitral valve is degenerative.  Trivial mitral valve regurgitation. No evidence of mitral stenosis.  4. The aortic valve  is normal in structure. Aortic valve regurgitation is not visualized. No aortic stenosis is present.  5. The inferior vena cava is normal in size with greater than 50% respiratory variability, suggesting right atrial pressure of 3 mmHg. FINDINGS  Left Ventricle: Left ventricular ejection fraction, by estimation, is 65 to 70%. The left ventricle has normal function. The left ventricle has no regional wall motion abnormalities. The left ventricular internal cavity size was normal in size. There is  moderate left ventricular hypertrophy. Left ventricular diastolic parameters were normal. Right Ventricle: The right ventricular size is normal. No increase in right ventricular wall thickness. Right ventricular systolic function is normal. Left Atrium: Left atrial size was normal in size. Right Atrium: Right atrial size was normal in size. Pericardium: There is no evidence of pericardial effusion. Mitral Valve: The mitral valve is degenerative in appearance. There is mild thickening of the mitral valve leaflet(s). There is mild calcification of the mitral valve leaflet(s). Trivial mitral valve regurgitation. No evidence of mitral valve stenosis. MV peak gradient, 5.6 mmHg. The mean mitral valve gradient is 2.0 mmHg. Tricuspid Valve: The tricuspid valve is normal in structure. Tricuspid valve regurgitation is not demonstrated. No evidence of tricuspid stenosis. Aortic Valve: The aortic valve is normal in structure. Aortic valve regurgitation is not visualized. No aortic stenosis is present. Aortic valve mean gradient measures 5.0 mmHg. Aortic valve peak gradient measures 8.1 mmHg. Aortic valve area, by VTI measures 2.53 cm. Pulmonic Valve: The pulmonic valve was normal in structure. Pulmonic valve regurgitation is trivial. No evidence of pulmonic stenosis. Aorta: The aortic root is normal in size and structure. Venous: The inferior  vena cava is normal in size with greater than 50% respiratory variability, suggesting right atrial pressure of 3 mmHg. IAS/Shunts: No atrial level shunt detected by color flow Doppler.  LEFT VENTRICLE PLAX 2D LVIDd:         4.20 cm  Diastology LVIDs:         2.70 cm  LV e' medial:    7.07 cm/s LV PW:         1.60 cm  LV E/e' medial:  15.6 LV IVS:        1.50 cm  LV e' lateral:   7.83 cm/s LVOT diam:     2.00 cm  LV E/e' lateral: 14.0 LV SV:         71 LV SV Index:   32 LVOT Area:     3.14 cm  RIGHT VENTRICLE            IVC RV Basal diam:  3.10 cm    IVC diam: 1.40 cm RV S prime:     8.70 cm/s TAPSE (M-mode): 1.6 cm LEFT ATRIUM             Index       RIGHT ATRIUM           Index LA diam:        3.70 cm 1.66 cm/m  RA Area:     14.30 cm LA Vol (A2C):   50.7 ml 22.81 ml/m RA Volume:   31.10 ml  13.99 ml/m LA Vol (A4C):   57.5 ml 25.87 ml/m LA Biplane Vol: 54.1 ml 24.34 ml/m  AORTIC VALVE AV Area (Vmax):    2.46 cm AV Area (Vmean):   2.36 cm AV Area (VTI):     2.53 cm AV Vmax:           142.00 cm/s AV Vmean:  107.000 cm/s AV VTI:            0.281 m AV Peak Grad:      8.1 mmHg AV Mean Grad:      5.0 mmHg LVOT Vmax:         111.00 cm/s LVOT Vmean:        80.300 cm/s LVOT VTI:          0.226 m LVOT/AV VTI ratio: 0.80  AORTA Ao Root diam: 3.60 cm Ao Asc diam:  3.70 cm MITRAL VALVE MV Area (PHT): 3.65 cm     SHUNTS MV Area VTI:   2.18 cm     Systemic VTI:  0.23 m MV Peak grad:  5.6 mmHg     Systemic Diam: 2.00 cm MV Mean grad:  2.0 mmHg MV Vmax:       1.18 m/s MV Vmean:      63.5 cm/s MV Decel Time: 208 msec MV E velocity: 110.00 cm/s MV A velocity: 69.80 cm/s MV E/A ratio:  1.58 Candee Furbish MD Electronically signed by Candee Furbish MD Signature Date/Time: 08/11/2020/2:21:41 PM    Final    US Abdomen Limited RUQ (LIVER/GB)  Result Date: 08/10/2020 CLINICAL DATA:  Elevated LFTs EXAM: ULTRASOUND ABDOMEN LIMITED RIGHT UPPER QUADRANT COMPARISON:  06/08/2020 FINDINGS: Gallbladder: Patient is status post prior  cholecystectomy. Common bile duct: Diameter: 4 mm Liver: No focal lesion identified. Within normal limits in parenchymal echogenicity. Portal vein is patent on color Doppler imaging with normal direction of blood flow towards the liver. Other: None. IMPRESSION: Status post cholecystectomy.  No acute abnormality. Electronically Signed   By: Constance Holster M.D.   On: 08/10/2020 23:56    Microbiology: No results found for this or any previous visit (from the past 240 hour(s)).   Labs: Basic Metabolic Panel: No results for input(s): NA, K, CL, CO2, GLUCOSE, BUN, CREATININE, CALCIUM, MG, PHOS in the last 168 hours. Liver Function Tests: No results for input(s): AST, ALT, ALKPHOS, BILITOT, PROT, ALBUMIN in the last 168 hours. No results for input(s): LIPASE, AMYLASE in the last 168 hours. No results for input(s): AMMONIA in the last 168 hours. CBC: No results for input(s): WBC, NEUTROABS, HGB, HCT, MCV, PLT in the last 168 hours. Cardiac Enzymes: No results for input(s): CKTOTAL, CKMB, CKMBINDEX, TROPONINI in the last 168 hours. BNP: BNP (last 3 results) Recent Labs    08/10/20 1440  BNP 109.6*    ProBNP (last 3 results) No results for input(s): PROBNP in the last 8760 hours.  CBG: No results for input(s): GLUCAP in the last 168 hours.     Signed:  Domenic Polite MD.  [facility] 08/24/2020, 2:29 PM

## 2020-08-27 ENCOUNTER — Ambulatory Visit (INDEPENDENT_AMBULATORY_CARE_PROVIDER_SITE_OTHER): Payer: Managed Care, Other (non HMO) | Admitting: Internal Medicine

## 2020-08-27 ENCOUNTER — Telehealth: Payer: Self-pay | Admitting: *Deleted

## 2020-08-27 ENCOUNTER — Encounter: Payer: Self-pay | Admitting: Internal Medicine

## 2020-08-27 VITALS — BP 80/50 | HR 68 | Ht 70.0 in | Wt 231.4 lb

## 2020-08-27 DIAGNOSIS — D126 Benign neoplasm of colon, unspecified: Secondary | ICD-10-CM

## 2020-08-27 DIAGNOSIS — R748 Abnormal levels of other serum enzymes: Secondary | ICD-10-CM | POA: Diagnosis not present

## 2020-08-27 DIAGNOSIS — K219 Gastro-esophageal reflux disease without esophagitis: Secondary | ICD-10-CM

## 2020-08-27 DIAGNOSIS — K5909 Other constipation: Secondary | ICD-10-CM | POA: Diagnosis not present

## 2020-08-27 DIAGNOSIS — K3184 Gastroparesis: Secondary | ICD-10-CM

## 2020-08-27 DIAGNOSIS — N185 Chronic kidney disease, stage 5: Secondary | ICD-10-CM

## 2020-08-27 MED ORDER — LACTULOSE 20 GM/30ML PO SOLN: 30.0000 mL | ORAL | 1 refills | Status: DC | PRN

## 2020-08-27 NOTE — Patient Instructions (Signed)
You have been scheduled for a follow up with Dr Hilarie Fredrickson on 11/20/20 at 9:30 am.  Please continue Nexium.  Continue Reglan every night.  Continue Senna.  Continue lactulose (may use a bit more liberally).  If you are age 57 or younger, your body mass index should be between 19-25. Your Body mass index is 33.2 kg/m. If this is out of the aformentioned range listed, please consider follow up with your Primary Care Provider.   Due to recent changes in healthcare laws, you may see the results of your imaging and laboratory studies on MyChart before your provider has had a chance to review them.  We understand that in some cases there may be results that are confusing or concerning to you. Not all laboratory results come back in the same time frame and the provider may be waiting for multiple results in order to interpret others.  Please give [place] 48 hours in order for your provider to thoroughly review all the results before contacting the office for clarification of your results.

## 2020-08-27 NOTE — Telephone Encounter (Signed)
I have spoken to patient's wife, Chase Clark to advise of Dr Keturah Barre recommendations to hold hydralazine indefinitely and continue carvedilol at the current dosage due to pronounced hypotension. Terra verbalizes understanding (she takes care of patient's medications). In addition, she will have patient to return a call to our office so we can speak with him regarding this information as well.

## 2020-08-27 NOTE — Telephone Encounter (Signed)
Excerpt from Dr Vena Rua office note 08/27/20:  Addendum: I spoke to Dr. Candiss Norse, the patient's nephrologist regarding the hypotension.  Dr. Candiss Norse recommended holding the hydralazine indefinitely and continuing carvedilol at current dose.  If he has continued hypotension or any other issues the patient is advised to call Dr. Keturah Barre office

## 2020-08-27 NOTE — Telephone Encounter (Signed)
I have spoken to patient regarding Dr Keturah Barre recommendations and he indicates that he understands these.

## 2020-08-27 NOTE — Progress Notes (Addendum)
Subjective:    Patient ID: Chase Clark, male    DOB: Jan 02, 1966, 55 y.o.   MRN: [id number]  HPI Chase Clark is a 55 year old male with a history of GERD, gastroparesis, multiple adenomatous colonic polyps, diverticulosis, family history of cirrhosis  (NASH) worsening kidney disease soon to start peritoneal dialysis, CAD with prior CABG in October 2020, diabetes with neuropathy and retinopathy who is seen in follow-up.  He had an upper endoscopy and colonoscopy performed on 05/21/2020 EGD --no evidence for Barrett's.  Small hyperplastic gastric polyp.  No H. Pylori. Colon --greater than 20 adenomatous colon polyps, 1 EMR in the cecum, diverticulosis --78-month recall recommended  Also after his last visit we performed an ultrasound with elastography given his family history of fatty liver cirrhosis and his mother.  The elastography which is included in this note below, did not show any evidence of steatosis or advanced fibrosis (high probability of being normal).  Chase Clark reports he has been feeling poorly recently with declining renal function.  He has had weakness as well as issues controlling his blood pressure and blood glucose levels.  He is working with Dr. Candiss Norse his nephrologist in [place] as well as with primary care.  He will be having laparoscopic insertion of peritoneal dialysis catheter later this week to begin nocturnal PD.  His blood pressure medications have been adjusted.  His carvedilol was recently increased and he is continued hydralazine.  He tried a clonidine patch but this made him feel very very poorly and it was discontinued.  From a GI perspective he is taking Nexium daily and his heartburn has been well controlled.  His appetite is fluctuated but his early morning nausea and abdominal bloating and discomfort has improved with the metoclopramide 10 mg at bedtime.  He still struggles intermittently with constipation but the lactulose works well to induce bowel movement  within a few hours.  He is taking senna 2 tablets daily.  He is not using lactulose on a regular basis.  If he eats late or overeats he will have increasing abdominal fullness and belching but he tries to avoid late meals.   Review of Systems As per HPI, otherwise negative  Current Medications, Allergies, Past Medical History, Past Surgical History, Family History and Social History were reviewed in [employer] record.     Objective:   Physical Exam BP (!) 80/50   Pulse 68   Ht 5\' 10"  (1.778 m)   Wt 231 lb 6.4 oz (105 kg)   BMI 33.20 kg/m  Gen: awake, alert, NAD HEENT: anicteric CV: RRR, no mrg Pulm: CTA b/l Abd: soft, NT/ND, +BS throughout Ext: no c/c/e Neuro: nonfocal  CMP     Component Value Date/Time   NA 130 (L) 08/11/2020 0334   NA 140 02/14/2019 1038   K 3.4 (L) 08/11/2020 0334   K 4.5 12/29/2013 1521   CL 99 08/11/2020 0334   CO2 20 (L) 08/11/2020 0334   GLUCOSE 167 (H) 08/11/2020 0334   BUN 63 (H) 08/11/2020 0334   BUN 26 (H) 02/14/2019 1038   CREATININE 5.50 (H) 08/11/2020 0334   CREATININE 2.42 (H) 12/31/2018 1454   CALCIUM 8.2 (L) 08/11/2020 0334   PROT 5.2 (L) 08/11/2020 0334   ALBUMIN 2.0 (L) 08/11/2020 0334   AST 261 (H) 08/11/2020 0334   ALT 98 (H) 08/11/2020 0334   ALKPHOS 66 08/11/2020 0334   BILITOT 1.0 08/11/2020 0334   GFRNONAA 11 (L) 08/11/2020 0334   GFRAA 28 (  L) 03/09/2019 1551   CBC    Component Value Date/Time   WBC 9.6 08/11/2020 0334   RBC 2.85 (L) 08/11/2020 0334   HGB 8.8 (L) 08/11/2020 0334   HGB 9.8 (L) 02/14/2019 1038   HCT 25.4 (L) 08/11/2020 0334   HCT 29.8 (L) 02/14/2019 1038   PLT 163 08/11/2020 0334   PLT 272 02/14/2019 1038   MCV 89.1 08/11/2020 0334   MCV 86 02/14/2019 1038   MCH 30.9 08/11/2020 0334   MCHC 34.6 08/11/2020 0334   RDW 12.8 08/11/2020 0334   RDW 13.5 02/14/2019 1038   LYMPHSABS 0.9 08/10/2020 1440   MONOABS 1.3 (H) 08/10/2020 1440   EOSABS 0.3 08/10/2020 1440   BASOSABS  0.0 08/10/2020 1440   Lab Results  Component Value Date   INR 1.3 (H) 01/27/2019   INR 1.1 01/19/2019   INR 1.32 09/04/2017   ULTRASOUND ABDOMEN   Gallbladder: Not visualized   Common bile duct: Diameter: Normal caliber, 6 mm   Liver: No focal lesion identified. Within normal limits in parenchymal echogenicity. Portal vein is patent on color Doppler imaging with normal direction of blood flow towards the liver.   IVC: No abnormality visualized.   Pancreas: Not visualized   Spleen: Size and appearance within normal limits.   Right Kidney: Length: 11.7 cm. Echogenicity within normal limits. No mass or hydronephrosis visualized.   Left Kidney: Length: 13.4 cm. Echogenicity within normal limits. No mass or hydronephrosis visualized.   Abdominal aorta: Not visualized   Other findings: None.   ULTRASOUND HEPATIC ELASTOGRAPHY   Device: Siemens Helix VTQ   Patient position: Supine   Transducer 5C1   Number of measurements: 10   Hepatic segment:  8   Median kPa: 4.1   IQR: 0.9   IQR/Median kPa ratio: 0.2   Data quality:  Good   Diagnostic category:  < or = 5 kPa: high probability of being normal   The use of hepatic elastography is applicable to patients with viral hepatitis and non-alcoholic fatty liver disease. At this time, there is insufficient data for the referenced cut-off values and use in other causes of liver disease, including alcoholic liver disease. Patients, however, may be assessed by elastography and serve as their own reference standard/baseline.   In patients with non-alcoholic liver disease, the values suggesting compensated advanced chronic liver disease (cACLD) may be lower, and patients may need additional testing with elasticity results of 7-9 kPa.   Please note that abnormal hepatic elasticity and shear wave velocities may also be identified in clinical settings other than with hepatic fibrosis, such as: acute hepatitis, elevated  right heart and central venous pressures including use of beta blockers, veno-occlusive disease (Budd-Chiari), infiltrative processes such as mastocytosis/amyloidosis/infiltrative tumor/lymphoma, extrahepatic cholestasis, with hyperemia in the post-prandial state, and with liver transplantation. Correlation with patient history, laboratory data, and clinical condition recommended.   Diagnostic Categories:   < or =5 kPa: high probability of being normal   < or =9 kPa: in the absence of other known clinical signs, rules out cACLD   >9 kPa and ?13 kPa: suggestive of cACLD, but needs further testing   >13 kPa: highly suggestive of cACLD   > or =17 kPa: highly suggestive of cACLD with an increased probability of clinically significant portal hypertension   IMPRESSION: ULTRASOUND ABDOMEN:   No acute findings   ULTRASOUND HEPATIC ELASTOGRAPHY:   Median kPa:  4.1   Diagnostic category:  < or = 5 kPa: high probability of being  normal     Electronically Signed   By: Rolm Baptise M.D.   On: 06/08/2020 10:48        Assessment & Plan:  55 year old male with a history of GERD, gastroparesis, multiple adenomatous colonic polyps, diverticulosis, family history of cirrhosis  (NASH) worsening kidney disease soon to start peritoneal dialysis, CAD with prior CABG in October 2020, diabetes with neuropathy and retinopathy who is seen in follow-up.  1.  Stage V kidney disease/hypertension --he will be beginning peritoneal dialysis later this week.  I have reached out to his nephrologist this morning and waiting on a call back to discuss his hypotension.  He needs adjustments of his antihypertensive regimen.  He is having some dizziness with standing but with sitting he is not having dizziness, lightheadedness or change in vision.  No current chest pain or shortness of breath. -- Discussion with nephrology about antihypertensive adjustment -- To begin peritoneal dialysis later this week -- Lab  work to be followed by nephrology including electrolyte panels  2. GERD/gastroparesis --will continue Nexium 20 mg daily and metoclopramide 10 mg at bedtime.  Continue GERD and gastroparesis diet -- No Barrett's esophagus at last upper endoscopy -- Continue Nexium 20 mg daily and metoclopramide 10 mg at bedtime  3.  Chronic constipation --we will continue senna 2 tablets daily but have asked that he increase lactulose to every day if needed to maintain regular bowel movements  4.  Multiple adenomatous colon polyps --he had over 20 adenomatous colon polyps 1 with a piecemeal endoscopic mucosal resection in the cecum.  I had plan for 36-month follow-up colonoscopy however with his pending dialysis I will likely defer this several months until he is well-established on his dialysis and his electrolytes stabilize -- Colonoscopy later this year to be discussed at follow-up  5.  Hyperplastic gastric polyp --small, subcentimeter, no H. pylori, repeat EGD in 2 years  6.  Elevated liver enzymes --significantly elevated AST, ALT over the last several months.  His ultrasounds of the liver have been unremarkable.  It is very likely that this is secondary to his renal failure.  For now I recommend that he start dialysis and then we will monitor liver function once his dialysis is ongoing and stable.  Further testing can be done in the future if liver enzymes remain elevated -- Repeat hepatic function panel once dialysis stable -- No evidence for cirrhosis or even fatty liver by ultrasound earlier this year  Follow-up with me in July or August  40 minutes total spent today including patient facing time, coordination of care, reviewing medical history/procedures/pertinent radiology studies, and documentation of the encounter.  Addendum: I spoke to Dr. Candiss Norse, the patient's nephrologist regarding the hypotension.  Dr. Candiss Norse recommended holding the hydralazine indefinitely and continuing carvedilol at current  dose.  If he has continued hypotension or any other issues the patient is advised to call Dr. Keturah Barre office

## 2020-08-28 ENCOUNTER — Encounter: Payer: Self-pay | Admitting: Surgery

## 2020-08-28 NOTE — Progress Notes (Signed)
Perioperative Services  Pre-Admission/Anesthesia Testing Clinical Review  Date: 08/28/20  Patient Demographics:  Name: Chase Clark DOB:   [birth year] MRN:   [id number]  Planned Surgical Procedure(s):    Case: [id number] Date/Time: 08/30/20 0715   Procedure: LAPAROSCOPIC INSERTION CONTINUOUS AMBULATORY PERITONEAL DIALYSIS  (CAPD) CATHETER (N/A )   Anesthesia type: General   Pre-op diagnosis: Chronic kidney disease stage V   Location: [facility] OR ROOM 04 / [place] [facility]S FOR ANESTHESIA GROUP   Surgeons: Olean Ree, MD    NOTE: Available PAT nursing documentation and vital signs have been reviewed. Clinical nursing staff has updated patient's PMH/PSHx, current medication list, and drug allergies/intolerances to ensure comprehensive history available to assist in medical decision making as it pertains to the aforementioned surgical procedure and anticipated anesthetic course.   Clinical Discussion:  Chase Clark is a 55 y.o. male who is submitted for pre-surgical anesthesia review and clearance prior to him undergoing the above procedure. Patient is a Former Smoker (quit 03/2008). Pertinent PMH includes: CAD (s/p CABG), MI (s/p PCI), aortic atherosclerosis, PVD, HTN, HLD, T2DM, CKD-IV, GERD (on daily PPI), OA, anemia.  Patient is followed by cardiology Chase Glassing, MD). He was last seen in the cardiology clinic on 07/11/2020; notes reviewed.  At the time of his clinic visit, patient doing well overall from a cardiovascular perspective.  He denied any chest pain, shortness breath, PND, orthopnea, palpitations, vertiginous symptoms, or presyncope/syncope.  Patient with chronic peripheral edema and complaints of back pain.  Patient with a significant cardiovascular history.   Patient suffered a myocardial infarction. Patient underwent diagnostic left heart catheterization on 11/20/2009 that revealed 60% stenosis of the LAD, 80% stenosis of the D1, and 30% stenosis of OM1.  DES x 1 placed to be D1  lesion.   Myocardial perfusion imaging studies performed on 11/15/2013 (EF 58%), 09/17/2015 (EF 58%) and 04/09/2017 (EF 52%) revealed no evidence of stress-induced myocardial ischemia or arrhythmia.    Repeat cardiac catheterization performed on 12/02/2013 revealed a 40% stenosis of the proximal LAD, 10% stenosis of D1, and 25% stenosis of OM1.  Medical management was recommended.   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   TTE on 01/19/2019 demonstrated normal LV systolic function with an EF of 55-60%. There was evidence of mild valvular insufficiency. Repeat heart catheterization on 01/20/2019 revealed 60% acute marginal lesion and 90% stenosis of the mid LAD. The previously placed D1 stent was widely patent with no evidence of in stent restenosis. Attempted PCI for the high grade mid LAD lesions, however due to tortuosity and calcification, the procedure was unable to be completed and CABG was recommended.    Patient underwent a 2 vessel CABG procedure on 01/27/2019. LIMA-LAD and SVG-D1 grafts were placed. Intraoperative TEE demonstrative a maintained LVEF of 55-60%.  Patient on GDMT for his HTN and HLD diagnoses.  Blood pressure variably controlled on currently prescribed beta blocker, vasodilator, and diuretic therapies.  Patient is on a statin and ezetimibe for his HLD. T2DM well controlled on currently prescribed regimen; Hgb A1c 7.0% when last checked on 06/05/2020. Patient with worsening function; under the care of nephrology. Functional capacity, as defined by DASI, is documented as being >/= 4 METS.  No changes were made to patient's medication regimen.  Patient to follow-up with outpatient cardiology in 3 months or sooner if needed.  Patient is scheduled for a CAPD catheter placement  on 08/30/2020 with Dr. Olean Ree. Given patient's past medical history significant for  cardiovascular diagnoses, presurgical cardiac clearance was sought by the  performing surgeon's office and PAT team. Per cardiology, "this patient is optimized for surgery and may proceed with the planned procedural course with a LOW risk stratification". This patient is on daily antiplatelet therapy. He has been instructed on recommendations for holding his daily low dose ASA for 5 days prior to his procedure with plans to restart as soon as postoperative bleeding risk felt to be minimized by his attending surgeon. The patient has been instructed that his last dose of his anticoagulant will be on 08/23/2020.  Patient reports previous perioperative complications with anesthesia in the past. Patient has a PMH (+) for PONV.  Order placed by PAT for patient to receive a prophylactic preoperative dose of aprepitant.  In review of the available records, it is noted that patient underwent a general anesthetic course at [facility] ([facility]) in 01/2019 without documented complications.   Vitals with BMI 08/27/2020 08/17/2020 08/12/2020  Height 5\' 10"  5\' 10"  -  Weight 231 lbs 6 oz 230 lbs -  BMI 54.0 33 -  Systolic 80 - 981  Diastolic 50 - 70  Pulse 68 - 68    Providers/Specialists:   NOTE: Primary physician provider listed below. Patient may have been seen by APP or partner within same practice.   PROVIDER ROLE / SPECIALTY LAST Delight Stare, MD  General Surgery  08/03/2020  [name], MD  Primary Care Provider  06/07/2020  Bartholome Bill, MD  Cardiology  07/11/2020  Murlean Iba, MD  Nephrology  08/22/2020   Allergies:  Patient has no known allergies.  Current Home Medications:   No current facility-administered medications for this encounter.   Marland Kitchen acetaminophen (TYLENOL) 325 MG tablet  . aspirin (ASPIRIN CHILDRENS) 81 MG chewable tablet  . atorvastatin (LIPITOR) 80 MG tablet  . brimonidine-timolol (COMBIGAN) 0.2-0.5 % ophthalmic solution  . carvedilol (COREG) 25 MG tablet  . cholecalciferol (VITAMIN D3) 25 MCG (1000 UT) tablet  . Continuous  Blood Gluc Sensor (DEXCOM G6 SENSOR) MISC  . Continuous Blood Gluc Transmit (DEXCOM G6 TRANSMITTER) MISC  . dorzolamide (TRUSOPT) 2 % ophthalmic solution  . esomeprazole (NEXIUM) 20 MG capsule  . ezetimibe (ZETIA) 10 MG tablet  . fluticasone (FLONASE) 50 MCG/ACT nasal spray  . gabapentin (NEURONTIN) 300 MG capsule  . hydrALAZINE (APRESOLINE) 25 MG tablet  . insulin degludec (TRESIBA FLEXTOUCH) 200 UNIT/ML FlexTouch Pen  . insulin lispro (HUMALOG KWIKPEN) 100 UNIT/ML KwikPen  . Insulin Pen Needle 32G X 4 MM MISC  . Lactulose 20 GM/30ML SOLN  . latanoprost (XALATAN) 0.005 % ophthalmic solution  . metoCLOPramide (REGLAN) 10 MG tablet  . Multiple Vitamin (MULTIVITAMIN WITH MINERALS) TABS tablet  . ondansetron (ZOFRAN-ODT) 4 MG disintegrating tablet  . PARoxetine (PAXIL) 10 MG tablet  . senna (SENOKOT) 8.6 MG tablet  . torsemide (DEMADEX) 20 MG tablet   History:   Past Medical History:  Diagnosis Date  . Anemia   . Arthritis   . Asthma    as child  . Barrett's esophagus 02/16/2014   short segment  . Cataract   . Chronic kidney disease (CKD), stage IV (severe) ([place])   . Coronary artery disease   . DDD (degenerative disc disease), cervical   . Diabetes mellitus without complication ([place])   . Diabetic osteomyelitis ([place])   . Diabetic peripheral neuropathy ([place])   . Diabetic ulcer of foot with muscle involvement without evidence of necrosis ([place])    DIABETIC ULCERATIONS  ASSOCIATED WITH IRRITATION LATERAL ANKLE LEFT GREATER THAN RIGHT WITH MILD CELLULITIS   . Diverticulosis   . DKA (diabetic ketoacidoses) 08/23/2017  . Edema, lower extremity   . Fatty liver   . Gastroparesis 2015  . GERD (gastroesophageal reflux disease)   . Gilbert's disease   . Glaucoma   . Hx of BKA, left ([place])   . Hx of CABG 01/27/2019   2 vessels; LIMA-LAD, SVG-D1  . Hx of heart artery stent 11/20/2009   80% D1 stenosis - 2.5 x 18 mm Xience V Everolimus (DES) stent x 1 placed  . Hyperlipidemia   .  Hypertension   . Left below-knee amputee ([place])   . Legally blind   . Myocardial infarction ([place]) 11/2009  . Neuropathy   . Osteoarthritis of spine   . Osteomyelitis of left foot ([place])   . PONV (postoperative nausea and vomiting)   . PVD (peripheral vascular disease) ([place])   . Seasonal allergies   . Shoulder pain   . Tubular adenoma of colon   . Ulcer of foot ([place]) 08.06.2014   DIABETIC ULCERATIONS ASSOCIATED WITH IRRITATION LATERAL ANKLE LEFT GREATER THAN RIGHT WITH MILD CELLULITIS   Past Surgical History:  Procedure Laterality Date  . ABDOMINAL AORTOGRAM N/A 05/20/2016   Procedure: Abdominal Aortogram possible intervention;  Surgeon: Katha Cabal, MD;  [facility];  Service: Cardiovascular;  Laterality: N/A;  . AMPUTATION Left 09/05/2017   Procedure: AMPUTATION BELOW KNEE;  Surgeon: Tania Ade, MD;  Location: [place];  Service: Orthopedics;  Laterality: Left;  . ANTERIOR CERVICAL DECOMP/DISCECTOMY FUSION N/A 05/07/2017   Procedure: ANTERIOR CERVICAL DECOMPRESSION/DISCECTOMY [place]ia Maxin PROSTHESIS,PLATE/SCREWS CERVICAL FIVE - CERVICAL SIX;  Surgeon: Newman Pies, MD;  Location: [place];  Service: Neurosurgery;  Laterality: N/A;  . CATARACT EXTRACTION W/ INTRAOCULAR LENS IMPLANT    . CATARACT EXTRACTION W/PHACO Left 02/26/2017   Procedure: CATARACT EXTRACTION PHACO AND INTRAOCULAR LENS PLACEMENT (IOC);  Surgeon: Eulogio Bear, MD;  Location: [facility];  Service: Ophthalmology;  Laterality: Left;  Lot # [id number] H [place]: 00:25.3 AP%: 6.2 CDE: 1.59   . CHOLECYSTECTOMY N/A   . CORONARY ANGIOPLASTY WITH STENT PLACEMENT  11/20/2009   80% D1 stenosis - 2.5 x 18 mm Xience V Everolimus (DES) stent x 1 placed; LOCATION: [facility]; SURGEON: Bartholome Bill, MD  . CORONARY ARTERY BYPASS GRAFT N/A 01/27/2019   Procedure: OFF PUMP CORONARY ARTERY BYPASS GRAFTING (CABG) X 2 WITH ENDOSCOPIC HARVESTING OF RIGHT GREATER SAPHENOUS VEIN. LIMA TO LAD;  Surgeon: Lajuana Matte, MD;  Location: [place];  Service: Open Heart Surgery;  Laterality: N/A;  . CORONARY STENT INTERVENTION N/A 01/21/2019   Procedure: CORONARY STENT INTERVENTION;  Surgeon: Belva Crome, MD;  Location: [place] [facility];  Service: Cardiovascular;  Laterality: N/A;  . EPIBLEPHERON REPAIR WITH TEAR DUCT PROBING    . EYE SURGERY Right 02/09/2014   cataract extraction  . INSERTION EXPRESS TUBE SHUNT Right 09/06/2015   Procedure: INSERTION AHMED TUBE SHUNT with tutoplast allograft;  Surgeon: Eulogio Bear, MD;  Location: [facility];  Service: Ophthalmology;  Laterality: Right;  . LEFT HEART CATH AND CORONARY ANGIOGRAPHY N/A 01/20/2019   Procedure: LEFT HEART CATH AND CORONARY ANGIOGRAPHY;  Surgeon: Lorretta Harp, MD;  Location: [place] [facility];  Service: Cardiovascular;  Laterality: N/A;  . TEE WITHOUT CARDIOVERSION N/A 01/27/2019   Procedure: TRANSESOPHAGEAL ECHOCARDIOGRAM (TEE);  Surgeon: Lajuana Matte, MD;  Location: [place];  Service: Open Heart Surgery;  Laterality: N/A;  .  VASECTOMY     Family History  Problem Relation Age of Onset  . Hyperlipidemia Mother   . Diabetes Mother   . Liver disease Mother        NASH  . Other Mother        immune thrombocytopenic pupura (ITP)  . Hyperlipidemia Father   . Diabetes Father   . Heart disease Father   . Hypertension Father   . Lymphoma Father   . Hemochromatosis Other        Grandmother (? which side)  . Polycythemia Other   . Colon cancer Neg Hx   . Esophageal cancer Neg Hx   . Rectal cancer Neg Hx   . Stomach cancer Neg Hx    Social History   Tobacco Use  . Smoking status: Former Smoker    Types: Cigarettes    Quit date: 03/14/2008    Years since quitting: 12.4  . Smokeless tobacco: Never Used  Vaping Use  . Vaping Use: Never used  Substance Use Topics  . Alcohol use: Yes    Comment: OCCAS  . Drug use: No    Pertinent Clinical Results:  LABS: Labs reviewed: Acceptable for surgery.  Lab Results  Component  Value Date   WBC 9.6 08/11/2020   HGB 8.8 (L) 08/11/2020   HCT 25.4 (L) 08/11/2020   MCV 89.1 08/11/2020   PLT 163 08/11/2020   Lab Results  Component Value Date   NA 130 (L) 08/11/2020   K 3.4 (L) 08/11/2020   CO2 20 (L) 08/11/2020   GLUCOSE 167 (H) 08/11/2020   BUN 63 (H) 08/11/2020   CREATININE 5.50 (H) 08/11/2020   CALCIUM 8.2 (L) 08/11/2020   GFRNONAA 11 (L) 08/11/2020   GFRAA 28 (L) 03/09/2019   Lab Results  Component Value Date   SARSCOV2NAA NEGATIVE 08/10/2020   SARSCOV2NAA NOT DETECTED 03/09/2019   [place] Not Detected 02/22/2019   [place] NEGATIVE 01/19/2019    ECG: Date: 08/10/2020 Time ECG obtained: 1334 PM Rate: 63 bpm Rhythm: normal sinus Axis (leads I and aVF): Normal Intervals: PR 198 ms. QRS 107 ms. QTc 456 ms. ST segment and T wave changes: Minimal inferior ST depression; minimal anterior ST elevation; lateral TWI Comparison: Similar to previous tracing obtained on 06/30/2020   IMAGING / PROCEDURES: ECHOCARDIOGRAM performed on 08/11/2020 1. Left ventricular ejection fraction, by estimation, is 65 to 70%. The left ventricle has normal function. The left ventricle has no regional wall motion abnormalities. There is moderate left ventricular hypertrophy. Left ventricular diastolic parameters were normal.  2. Right ventricular systolic function is normal. The right ventricular size is normal.  3. The mitral valve is degenerative. Trivial mitral valve regurgitation. No evidence of mitral stenosis.  4. The aortic valve is normal in structure. Aortic valve regurgitation is not visualized. No aortic stenosis is present.  5. The inferior vena cava is normal in size with greater than 50% respiratory variability, suggesting right atrial pressure of 3 mmHg.   LEFT HEART CATHETERIZATION AND CORONARY ANGIOGRAPHY performed on 01/20/2019 1. Acute marginal lesion is 60% stenosed. 2. Previously placed D1 stent (unknown type) is widely patent 3. Mid LAD lesion  is 90% stenosed  Failed PCI of mid LAD due to failure to cross with wire because of tortuosity and calcification  Sequential off-pump CABG recommended  High risk for kidney failure requiring dialysis either as result of contrast used today or certainly after bypass surgery    LEXISCAN performed on 04/09/2017 1. LVEF 52% 2. Regional wall  motion reveals normal myocardial thickening and wall motion 3. No evidence of stress-induced myocardial ischemia or arrhythmia 4. No artifacts noted 5. Left ventricular cavity size normal 6. The overall quality of the study is good  LEXISCAN performed on 09/17/2015 7. LVEF 58% 8. Regional wall motion reveals normal myocardial thickening and wall motion 9. No evidence of stress-induced myocardial ischemia or arrhythmia 10. No artifacts noted 11. Left ventricular cavity size normal 12. The overall quality of the study is good  LEFT HEART CATHETERIZATION AND CORONARY ANGIOGRAPHY performed on 12/02/2013 1. Nonobstructive CAD  40% stenosis of the proximal LAD  10% stenosis D1  25% stenosis OM1 2.  Medical management recommended  LEXISCAN performed on 11/15/2013 13. LVEF 58% 14. Regional wall motion reveals normal myocardial thickening and wall motion 15. No evidence of stress-induced myocardial ischemia or arrhythmia 16. No artifacts noted 17. Left ventricular cavity size normal 18. The overall quality of the study is good  LEFT HEART CATHETERIZATION AND CORONARY ANGIOGRAPHY performed on 11/20/2009 1. CAD of native coronary arteries  60% stenosis of the LAD  80% stenosis of D1  30% stenosis of OM1 2. Successful PCI performed - 2.5 x 18 mm Xience Everolimus (DES) x 1 placed  Impression and Plan:  Chase Clark has been referred for pre-anesthesia review and clearance prior to him undergoing the planned anesthetic and procedural courses. Available labs, pertinent testing, and imaging results were personally reviewed by me. This  patient has been appropriately cleared by cardiology with an overall LOW risk of significant perioperative cardiovascular complications.  Based on clinical review performed today (08/28/20), barring any significant acute changes in the patient's overall condition, it is anticipated that he will be able to proceed with the planned surgical intervention. Any acute changes in clinical condition may necessitate his procedure being postponed and/or cancelled. Patient will meet with anesthesia team (MD and/or CRNA) on the day of his procedure for preoperative evaluation/assessment. Questions regarding anesthetic course will be fielded at that time.   Pre-surgical instructions were reviewed with the patient during his PAT appointment and questions were fielded by PAT clinical staff. Patient was advised that if any questions or concerns arise prior to his procedure then he should return a call to PAT and/or his surgeon's office to discuss.  Honor Loh, MSN, APRN, FNP-C, CEN [facility]  Peri-operative Services Nurse Practitioner Phone: [phone] 08/28/20 3:05 PM  NOTE: This note has been prepared using Dragon dictation software. Despite my best ability to proofread, there is always the potential that unintentional transcriptional errors may still occur from this process.

## 2020-08-30 ENCOUNTER — Encounter
Admission: RE | Disposition: A | Discharge status: Home / Self Care | Payer: Self-pay | Source: Ambulatory Visit | Attending: Surgery

## 2020-08-30 ENCOUNTER — Encounter: Payer: Self-pay | Admitting: Surgery

## 2020-08-30 ENCOUNTER — Ambulatory Visit
Admission: RE | Admit: 2020-08-30 | Discharge: 2020-08-30 | Disposition: A | Discharge status: Home / Self Care | Payer: Managed Care, Other (non HMO) | Source: Ambulatory Visit | Attending: Surgery | Admitting: Surgery

## 2020-08-30 ENCOUNTER — Other Ambulatory Visit: Payer: Self-pay

## 2020-08-30 ENCOUNTER — Ambulatory Visit: Payer: Managed Care, Other (non HMO) | Admitting: Urgent Care

## 2020-08-30 ENCOUNTER — Ambulatory Visit: Payer: Managed Care, Other (non HMO) | Admitting: Internal Medicine

## 2020-08-30 DIAGNOSIS — E1022 Type 1 diabetes mellitus with diabetic chronic kidney disease: Secondary | ICD-10-CM | POA: Diagnosis not present

## 2020-08-30 DIAGNOSIS — N185 Chronic kidney disease, stage 5: Secondary | ICD-10-CM

## 2020-08-30 DIAGNOSIS — E1042 Type 1 diabetes mellitus with diabetic polyneuropathy: Secondary | ICD-10-CM | POA: Diagnosis not present

## 2020-08-30 DIAGNOSIS — Z833 Family history of diabetes mellitus: Secondary | ICD-10-CM | POA: Insufficient documentation

## 2020-08-30 DIAGNOSIS — Z951 Presence of aortocoronary bypass graft: Secondary | ICD-10-CM | POA: Diagnosis not present

## 2020-08-30 DIAGNOSIS — Z955 Presence of coronary angioplasty implant and graft: Secondary | ICD-10-CM | POA: Diagnosis not present

## 2020-08-30 DIAGNOSIS — Z794 Long term (current) use of insulin: Secondary | ICD-10-CM | POA: Insufficient documentation

## 2020-08-30 DIAGNOSIS — I120 Hypertensive chronic kidney disease with stage 5 chronic kidney disease or end stage renal disease: Secondary | ICD-10-CM | POA: Insufficient documentation

## 2020-08-30 DIAGNOSIS — Z7982 Long term (current) use of aspirin: Secondary | ICD-10-CM | POA: Diagnosis not present

## 2020-08-30 DIAGNOSIS — Z87891 Personal history of nicotine dependence: Secondary | ICD-10-CM | POA: Diagnosis not present

## 2020-08-30 DIAGNOSIS — N186 End stage renal disease: Secondary | ICD-10-CM | POA: Diagnosis present

## 2020-08-30 DIAGNOSIS — Z89512 Acquired absence of left leg below knee: Secondary | ICD-10-CM | POA: Insufficient documentation

## 2020-08-30 DIAGNOSIS — Z79899 Other long term (current) drug therapy: Secondary | ICD-10-CM | POA: Insufficient documentation

## 2020-08-30 HISTORY — DX: Acquired absence of left leg below knee: Z89.512

## 2020-08-30 HISTORY — PX: CAPD INSERTION: SHX5233

## 2020-08-30 LAB — POCT I-STAT, CHEM 8
BUN: 33 mg/dL — ABNORMAL HIGH (ref 6–20)
Calcium, Ion: 1.31 mmol/L (ref 1.15–1.40)
Chloride: 106 mmol/L (ref 98–111)
Creatinine, Ser: 3.9 mg/dL — ABNORMAL HIGH (ref 0.61–1.24)
Glucose, Bld: 52 mg/dL — ABNORMAL LOW (ref 70–99)
HCT: 26 % — ABNORMAL LOW (ref 39.0–52.0)
Hemoglobin: 8.8 g/dL — ABNORMAL LOW (ref 13.0–17.0)
Potassium: 4.1 mmol/L (ref 3.5–5.1)
Sodium: 140 mmol/L (ref 135–145)
TCO2: 23 mmol/L (ref 22–32)

## 2020-08-30 LAB — GLUCOSE, CAPILLARY
Glucose-Capillary: 52 mg/dL — ABNORMAL LOW (ref 70–99)
Glucose-Capillary: 79 mg/dL (ref 70–99)
Glucose-Capillary: 117 mg/dL — ABNORMAL HIGH (ref 70–99)
Glucose-Capillary: 79 mg/dL (ref 70–99)

## 2020-08-30 SURGERY — LAPAROSCOPIC INSERTION CONTINUOUS AMBULATORY PERITONEAL DIALYSIS  (CAPD) CATHETER
Anesthesia: General

## 2020-08-30 MED ORDER — CHLORHEXIDINE GLUCONATE 0.12 % MT SOLN: 15.0000 mL | Freq: Once | OROMUCOSAL | Status: AC

## 2020-08-30 MED ORDER — ROCURONIUM BROMIDE 100 MG/10ML IV SOLN
INTRAVENOUS | Status: DC | PRN
  Administered 2020-08-30 (×2): 10 mg via INTRAVENOUS
  Administered 2020-08-30: 40 mg via INTRAVENOUS

## 2020-08-30 MED ORDER — ACETAMINOPHEN 500 MG PO TABS: 1000.0000 mg | ORAL_TABLET | Freq: Four times a day (QID) | ORAL | Status: DC | PRN

## 2020-08-30 MED ORDER — MIDAZOLAM HCL 2 MG/2ML IJ SOLN
INTRAMUSCULAR | Status: AC
  Filled 2020-08-30: qty 2

## 2020-08-30 MED ORDER — FENTANYL CITRATE (PF) 100 MCG/2ML IJ SOLN
INTRAMUSCULAR | Status: DC | PRN
  Administered 2020-08-30: 25 ug via INTRAVENOUS
  Administered 2020-08-30: 50 ug via INTRAVENOUS
  Administered 2020-08-30: 25 ug via INTRAVENOUS

## 2020-08-30 MED ORDER — ACETAMINOPHEN 500 MG PO TABS
ORAL_TABLET | ORAL | Status: AC
  Administered 2020-08-30: 1000 mg via ORAL
  Filled 2020-08-30: qty 2

## 2020-08-30 MED ORDER — GABAPENTIN 100 MG PO CAPS
ORAL_CAPSULE | ORAL | Status: AC
  Administered 2020-08-30: 200 mg via ORAL
  Filled 2020-08-30: qty 2

## 2020-08-30 MED ORDER — CHLORHEXIDINE GLUCONATE CLOTH 2 % EX PADS
6.0000 | MEDICATED_PAD | Freq: Once | CUTANEOUS | Status: AC
  Administered 2020-08-30: 6 via TOPICAL

## 2020-08-30 MED ORDER — SODIUM CHLORIDE 0.9 % IV SOLN: INTRAVENOUS | Status: DC

## 2020-08-30 MED ORDER — HEPARIN SOD (PORK) LOCK FLUSH 100 UNIT/ML IV SOLN
INTRAVENOUS | Status: AC
  Filled 2020-08-30: qty 5

## 2020-08-30 MED ORDER — DEXMEDETOMIDINE (PRECEDEX) IN NS 20 MCG/5ML (4 MCG/ML) IV SYRINGE
PREFILLED_SYRINGE | INTRAVENOUS | Status: DC | PRN
Start: 2020-08-30 — End: 2020-08-30
  Administered 2020-08-30 (×2): 4 ug via INTRAVENOUS

## 2020-08-30 MED ORDER — DEXAMETHASONE SODIUM PHOSPHATE 10 MG/ML IJ SOLN
INTRAMUSCULAR | Status: DC | PRN
  Administered 2020-08-30: 8 mg via INTRAVENOUS

## 2020-08-30 MED ORDER — DEXTROSE 50 % IV SOLN
INTRAVENOUS | Status: AC
  Filled 2020-08-30: qty 50

## 2020-08-30 MED ORDER — MIDAZOLAM HCL 2 MG/2ML IJ SOLN
INTRAMUSCULAR | Status: DC | PRN
  Administered 2020-08-30 (×2): 1 mg via INTRAVENOUS

## 2020-08-30 MED ORDER — PHENYLEPHRINE HCL (PRESSORS) 10 MG/ML IV SOLN
INTRAVENOUS | Status: DC | PRN
  Administered 2020-08-30: 100 ug via INTRAVENOUS
  Administered 2020-08-30 (×2): 50 ug via INTRAVENOUS
  Administered 2020-08-30: 100 ug via INTRAVENOUS

## 2020-08-30 MED ORDER — PROPOFOL 10 MG/ML IV BOLUS
INTRAVENOUS | Status: AC
  Filled 2020-08-30: qty 20

## 2020-08-30 MED ORDER — SODIUM CHLORIDE 0.9 % IV SOLN
INTRAVENOUS | Status: DC | PRN
  Administered 2020-08-30: 50 ug/min via INTRAVENOUS

## 2020-08-30 MED ORDER — CHLORHEXIDINE GLUCONATE 0.12 % MT SOLN
OROMUCOSAL | Status: AC
  Administered 2020-08-30: 15 mL via OROMUCOSAL
  Filled 2020-08-30: qty 15

## 2020-08-30 MED ORDER — APREPITANT 40 MG PO CAPS: 40.0000 mg | ORAL_CAPSULE | Freq: Once | ORAL | Status: AC

## 2020-08-30 MED ORDER — ACETAMINOPHEN 500 MG PO TABS: 1000.0000 mg | ORAL_TABLET | ORAL | Status: AC

## 2020-08-30 MED ORDER — PROPOFOL 10 MG/ML IV BOLUS
INTRAVENOUS | Status: DC | PRN
  Administered 2020-08-30: 150 mg via INTRAVENOUS

## 2020-08-30 MED ORDER — OXYCODONE HCL 5 MG PO TABS: 5.0000 mg | ORAL_TABLET | ORAL | 0 refills | Status: DC | PRN

## 2020-08-30 MED ORDER — ONDANSETRON HCL 4 MG/2ML IJ SOLN: 4.0000 mg | Freq: Once | INTRAMUSCULAR | Status: DC | PRN

## 2020-08-30 MED ORDER — FENTANYL CITRATE (PF) 100 MCG/2ML IJ SOLN: 25.0000 ug | INTRAMUSCULAR | Status: DC | PRN

## 2020-08-30 MED ORDER — HEPARIN SODIUM (PORCINE) 5000 UNIT/ML IJ SOLN
INTRAMUSCULAR | Status: AC
  Filled 2020-08-30: qty 1

## 2020-08-30 MED ORDER — GABAPENTIN 100 MG PO CAPS: 200.0000 mg | ORAL_CAPSULE | ORAL | Status: AC

## 2020-08-30 MED ORDER — SUGAMMADEX SODIUM 200 MG/2ML IV SOLN
INTRAVENOUS | Status: DC | PRN
  Administered 2020-08-30: 300 mg via INTRAVENOUS

## 2020-08-30 MED ORDER — CEFAZOLIN SODIUM-DEXTROSE 2-4 GM/100ML-% IV SOLN
INTRAVENOUS | Status: AC
  Filled 2020-08-30: qty 100

## 2020-08-30 MED ORDER — APREPITANT 40 MG PO CAPS
ORAL_CAPSULE | ORAL | Status: AC
  Administered 2020-08-30: 40 mg via ORAL
  Filled 2020-08-30: qty 1

## 2020-08-30 MED ORDER — ONDANSETRON HCL 4 MG/2ML IJ SOLN
INTRAMUSCULAR | Status: DC | PRN
  Administered 2020-08-30: 4 mg via INTRAVENOUS

## 2020-08-30 MED ORDER — DEXTROSE 50 % IV SOLN
0.5000 | Freq: Once | INTRAVENOUS | Status: AC
  Administered 2020-08-30: .5 via INTRAVENOUS

## 2020-08-30 MED ORDER — BUPIVACAINE-EPINEPHRINE (PF) 0.5% -1:200000 IJ SOLN
INTRAMUSCULAR | Status: AC
  Filled 2020-08-30: qty 30

## 2020-08-30 MED ORDER — CEFAZOLIN SODIUM-DEXTROSE 2-4 GM/100ML-% IV SOLN
2.0000 g | INTRAVENOUS | Status: AC
  Administered 2020-08-30: 2 g via INTRAVENOUS

## 2020-08-30 MED ORDER — BUPIVACAINE-EPINEPHRINE 0.5% -1:200000 IJ SOLN
INTRAMUSCULAR | Status: DC | PRN
  Administered 2020-08-30: 30 mL

## 2020-08-30 MED ORDER — ORAL CARE MOUTH RINSE: 15.0000 mL | Freq: Once | OROMUCOSAL | Status: AC

## 2020-08-30 MED ORDER — FENTANYL CITRATE (PF) 100 MCG/2ML IJ SOLN
INTRAMUSCULAR | Status: AC
  Filled 2020-08-30: qty 2

## 2020-08-30 MED ORDER — EPHEDRINE SULFATE 50 MG/ML IJ SOLN
INTRAMUSCULAR | Status: DC | PRN
  Administered 2020-08-30: 5 mg via INTRAVENOUS
  Administered 2020-08-30 (×2): 10 mg via INTRAVENOUS
  Administered 2020-08-30: 15 mg via INTRAVENOUS

## 2020-08-30 SURGICAL SUPPLY — 44 items
ADAPTER CATH DIALYSIS 4X8 IT L (MISCELLANEOUS) ×2 IMPLANT
ADH SKN CLS APL DERMABOND .7 (GAUZE/BANDAGES/DRESSINGS) ×1
APL PRP STRL LF DISP 70% ISPRP (MISCELLANEOUS) ×1
CANISTER SUCT 1200ML W/VALVE (MISCELLANEOUS) ×1 IMPLANT
CATH EXTENDED DIALYSIS (CATHETERS) ×2 IMPLANT
CHLORAPREP W/TINT 26 (MISCELLANEOUS) ×2 IMPLANT
COVER WAND RF STERILE (DRAPES) ×2 IMPLANT
DERMABOND ADVANCED (GAUZE/BANDAGES/DRESSINGS) ×1
DERMABOND ADVANCED .7 DNX12 (GAUZE/BANDAGES/DRESSINGS) ×1 IMPLANT
ELECT CAUTERY BLADE 6.4 (BLADE) ×2 IMPLANT
ELECT REM PT RETURN 9FT ADLT (ELECTROSURGICAL) ×2
ELECTRODE REM PT RTRN 9FT ADLT (ELECTROSURGICAL) ×1 IMPLANT
GLOVE SURG SYN 7.0 (GLOVE) ×6 IMPLANT
GLOVE SURG SYN 7.0 PF PI (GLOVE) ×1 IMPLANT
GLOVE SURG SYN 7.5  E (GLOVE) ×6
GLOVE SURG SYN 7.5 E (GLOVE) ×3 IMPLANT
GLOVE SURG SYN 7.5 PF PI (GLOVE) ×1 IMPLANT
GOWN STRL REUS W/ TWL LRG LVL3 (GOWN DISPOSABLE) ×2 IMPLANT
GOWN STRL REUS W/TWL LRG LVL3 (GOWN DISPOSABLE) ×6
KIT TURNOVER KIT A (KITS) ×2 IMPLANT
LABEL OR SOLS (LABEL) ×2 IMPLANT
MANIFOLD NEPTUNE II (INSTRUMENTS) ×2 IMPLANT
MINICAP W/POVIDONE IODINE SOL (MISCELLANEOUS) ×1 IMPLANT
NDL INSUFFLATION 14GA 120MM (NEEDLE) IMPLANT
NEEDLE HYPO 22GX1.5 SAFETY (NEEDLE) ×2 IMPLANT
NEEDLE INSUFFLATION 14GA 120MM (NEEDLE) ×2 IMPLANT
NS IRRIG 500ML POUR BTL (IV SOLUTION) ×2 IMPLANT
PACK LAP CHOLECYSTECTOMY (MISCELLANEOUS) ×2 IMPLANT
PENCIL ELECTRO HAND CTR (MISCELLANEOUS) ×2 IMPLANT
SET CYSTO W/LG BORE CLAMP LF (SET/KITS/TRAYS/PACK) ×1 IMPLANT
SET TRANSFER 6 W/TWIST CLAMP 5 (SET/KITS/TRAYS/PACK) ×2 IMPLANT
SET TUBE SMOKE EVAC HIGH FLOW (TUBING) ×2 IMPLANT
SLEEVE ADV FIXATION 5X100MM (TROCAR) IMPLANT
SPONGE DRAIN TRACH 4X4 STRL 2S (GAUZE/BANDAGES/DRESSINGS) ×2 IMPLANT
STYLET FALLER (MISCELLANEOUS) ×2 IMPLANT
STYLET FALLER MEDIONICS (MISCELLANEOUS) ×2 IMPLANT
SUT ETHILON 2 0 FS 18 (SUTURE) ×1 IMPLANT
SUT MNCRL 4-0 (SUTURE) ×2
SUT MNCRL 4-0 27XMFL (SUTURE) ×1
SUT VICRYL 0 AB UR-6 (SUTURE) ×2 IMPLANT
SUTURE MNCRL 4-0 27XMF (SUTURE) ×1 IMPLANT
SYS KII FIOS ACCESS ABD 5X100 (TROCAR) ×2
SYSTEM KII FIOS ACES ABD 5X100 (TROCAR) ×1 IMPLANT
TROCAR BALLN GELPORT 12X130M (ENDOMECHANICALS) ×2 IMPLANT

## 2020-08-30 NOTE — Anesthesia Procedure Notes (Signed)
Procedure Name: Intubation Date/Time: 08/30/2020 7:50 AM Performed by: Allean Found, CRNA Pre-anesthesia Checklist: Patient identified, Patient being monitored, Timeout performed, Emergency Drugs available and Suction available Patient Re-evaluated:Patient Re-evaluated prior to induction Oxygen Delivery Method: Circle system utilized Preoxygenation: Pre-oxygenation with 100% oxygen Induction Type: IV induction Ventilation: Mask ventilation without difficulty Laryngoscope Size: McGraph and 4 Grade View: Grade I Tube type: Oral Tube size: 7.0 mm Number of attempts: 1 Airway Equipment and Method: Stylet Placement Confirmation: ETT inserted through vocal cords under direct vision,  positive ETCO2 and breath sounds checked- equal and bilateral Secured at: 21 cm Tube secured with: Tape Dental Injury: Teeth and Oropharynx as per pre-operative assessment

## 2020-08-30 NOTE — Op Note (Signed)
Procedure Date:  08/30/2020  Pre-operative Diagnosis:  Chronic kidney disease stage V  Post-operative Diagnosis:  Chronic kidney disease stage V  Procedure: 1.  Laparoscopic Peritoneal Dialysis Catheter Placement 2.  Laparoscopic omentopexy  Surgeon:  Melvyn Neth, MD  Anesthesia:  General endotracheal  Estimated Blood Loss:  5 ml  Specimens:  None  Complications:  None  Indications for Procedure:  This is a 55 y.o. male who presents with chronic kidney disease, requiring dialysis catheter placement.  The benefits, complications, treatment options, and expected outcomes were discussed with the patient. The risks of bleeding, infection, injury to surrounding structures, need for further procedures were all discussed with the patient and he was willing to proceed.  Description of Procedure: The patient was correctly identified in the preoperative area and brought into the operating room.  The patient was placed supine with VTE prophylaxis in place.  Appropriate time-outs were performed.  Anesthesia was induced and the patient was intubated.  Appropriate antibiotics were infused.  The abdomen was prepped and draped in a sterile fashion.  The curled PD catheter and extension was placed over the abdomen to mark the location for our incisions.  A Veress needle was introduced in the left upper quadrant and pneumoperitoneum was obtained with appropriate pressures.  Using Optivue technique, a 5 mm port was placed in the right lateral abdomen.  Then, another 5-mm port was placed in the RLQ without complications.  The patient's pelvis was evaluated and there were no significant adhesions.  A left supraumbilical 3 cm incision was made at the site for catheter insertion.  Cautery was used to dissect down the subcutaneous tissue to the anterior rectus sheath.  The sheath was incised with cautery, and a 5 mm trocar was inserted down to the posterior rectus sheath.  Then, using direct visualization,  it was tunneled inferiorly for about 5 cm along the posterior rectus sheath, and then through the peritoneum.  The PD catheter was then inserted through the port and placed going towards the pelvis.  The cuff was then pushed distally into the peritoneum and then pulled proximally to be just deep to the anterior rectus sheath.  A 2 cm counterincision was made at the site of the cuff from the extension arm and the extension catheter and the PD catheter were trimmed to appropriate size and connected with titanium connector.  The catheter was then tunneled subcutaneously to the counter incision, and then tunneled again to the planned catheter exit site in the LUQ.  The catheter was maintained aligned at all times without any kinks.  We then proceeded with omentopexy using PMI and 2-0 Prolene suture, tacking the omentum to the anterior abdominal wall in the RUQ so it would not cause any complications with the PD catheter itself.  A 1000 ml NS bag with Heparin was infused into the catheter without issues and then drained about 700 ml out without issues.  The catheter was secured around the anterior rectus sheath with purse string suture.  Local anesthetic was infused over all incisions.  The two 5 mm ports were removed, and all incisions were closed using 3-0 Vicryl and 4-0 Monocryl.  The catheter tip was connected to the dialysis adapter which was capped sterily.  The catheter exit site was cleaned, dressed with BioPatch, 4x4 gauze, and tape.  The wounds were cleaned and sealed with DermaBond.  The patient was emerged from anesthesia and extubated and brought to the recovery room for further management.  The patient  tolerated the procedure well and all counts were correct at the end of the case.   Melvyn Neth, MD

## 2020-08-30 NOTE — Discharge Instructions (Signed)

## 2020-08-30 NOTE — Transfer of Care (Signed)
Immediate Anesthesia Transfer of Care Note  Patient: Chase Clark  Procedure(s) Performed: LAPAROSCOPIC INSERTION CONTINUOUS AMBULATORY PERITONEAL DIALYSIS  (CAPD) CATHETER (N/A )  Patient Location: PACU  Anesthesia Type:General  Level of Consciousness: awake, alert  and oriented  Airway & Oxygen Therapy: Patient Spontanous Breathing and Patient connected to face mask oxygen  Post-op Assessment: Report given to RN and Post -op Vital signs reviewed and stable  Post vital signs: Reviewed and stable  Last Vitals:  Vitals Value Taken Time  BP 164/78 08/30/20 0947  Temp    Pulse 65 08/30/20 0951  Resp 11 08/30/20 0951  SpO2 100 % 08/30/20 0951  Vitals shown include unvalidated device data.  Last Pain:  Vitals:   08/30/20 0636  TempSrc: Oral  PainSc: 0-No pain         Complications: No complications documented.

## 2020-08-30 NOTE — Brief Op Note (Signed)
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  PRE-OPERATIVE DIAGNOSIS:  Chronic kidney disease stage V  POST-OPERATIVE DIAGNOSIS:  Chronic kidney disease stage V  PROCEDURE:  Procedure(s): LAPAROSCOPIC INSERTION CONTINUOUS AMBULATORY PERITONEAL DIALYSIS  (CAPD) CATHETER (N/A)  SURGEON:  Surgeon(s) and Role:    * Dashel Goines, MD - Primary  PHYSICIAN ASSISTANT:   ASSISTANTS: none   ANESTHESIA:   general  EBL:  5 mL   BLOOD ADMINISTERED:none  DRAINS: Peritoneal dialysis drain in LUQ   LOCAL MEDICATIONS USED:  BUPIVICAINE   SPECIMEN:  No Specimen  DISPOSITION OF SPECIMEN:  N/A  COUNTS:  YES  TOURNIQUET:  * No tourniquets in log *  DICTATION: .Dragon Dictation and Note written in paper chart  PLAN OF CARE: Discharge to home after PACU  PATIENT DISPOSITION:  PACU - hemodynamically stable.   Delay start of Pharmacological VTE agent (>24hrs) due to surgical blood loss or risk of bleeding: yes

## 2020-08-30 NOTE — Interval H&P Note (Signed)
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  The various methods of treatment have been discussed with the patient and family. After consideration of risks, benefits and other options for treatment, the patient has consented to  Procedure(s): [place]  (CAPD) CATHETER (N/A) as a surgical intervention.  The patient's history has been reviewed, patient examined, no change in status, stable for surgery.  I have reviewed the patient's chart and labs.  Questions were answered to the patient's satisfaction.     Ronn Smolinsky

## 2020-08-30 NOTE — Anesthesia Preprocedure Evaluation (Signed)
Anesthesia Evaluation  Patient identified by MRN, date of birth, ID band Patient awake    Reviewed: Allergy & Precautions, NPO status , Patient's Chart, lab work & pertinent test results, reviewed documented beta blocker date and time   History of Anesthesia Complications (+) PONV and history of anesthetic complications  Airway Mallampati: II  TM Distance: >3 FB Neck ROM: Full    Dental no notable dental hx.    Pulmonary asthma , former smoker,    Pulmonary exam normal breath sounds clear to auscultation       Cardiovascular hypertension, Pt. on medications and Pt. on home beta blockers + CAD, + Past MI, + Cardiac Stents and + Peripheral Vascular Disease  Normal cardiovascular exam Rhythm:Regular Rate:Normal     Neuro/Psych  Headaches,  Neuromuscular disease negative psych ROS   GI/Hepatic Neg liver ROS, GERD  ,  Endo/Other  diabetes  Renal/GU Renal InsufficiencyRenal disease  negative genitourinary   Musculoskeletal negative musculoskeletal ROS (+)   Abdominal   Peds negative pediatric ROS (+)  Hematology  (+) anemia ,   Anesthesia Other Findings Past Medical History: No date: Anemia No date: Arthritis No date: Asthma     Comment:  as child 02/16/2014: Barrett's esophagus     Comment:  short segment No date: Cataract No date: Chronic kidney disease (CKD), stage IV (severe) (HCC) No date: Coronary artery disease No date: DDD (degenerative disc disease), cervical No date: Diabetes mellitus without complication (HCC) No date: Diabetic osteomyelitis ([place]) No date: Diabetic peripheral neuropathy (HCC) No date: Diabetic ulcer of foot with muscle involvement without  evidence of necrosis ([place])     Comment:  DIABETIC ULCERATIONS ASSOCIATED WITH IRRITATION LATERAL               ANKLE LEFT GREATER THAN RIGHT WITH MILD CELLULITIS  No date: Diverticulosis 08/23/2017: DKA (diabetic ketoacidoses) No date: Edema,  lower extremity No date: Fatty liver 2015: Gastroparesis No date: GERD (gastroesophageal reflux disease) No date: Gilbert's disease No date: Glaucoma No date: Hx of BKA, left ([place]) 01/27/2019: Hx of CABG     Comment:  2 vessels; LIMA-LAD, SVG-D1 11/20/2009: Hx of heart artery stent     Comment:  80% D1 stenosis - 2.5 x 18 mm Xience V Everolimus (DES)               stent x 1 placed No date: Hyperlipidemia No date: Hypertension No date: Left below-knee amputee ([place]) No date: Legally blind 11/2009: Myocardial infarction ([place]) No date: Neuropathy No date: Osteoarthritis of spine No date: Osteomyelitis of left foot (HCC) No date: PONV (postoperative nausea and vomiting) No date: PVD (peripheral vascular disease) (HCC) No date: Seasonal allergies No date: Shoulder pain No date: Tubular adenoma of colon 08.06.2014: Ulcer of foot ([place])     Comment:  DIABETIC ULCERATIONS ASSOCIATED WITH IRRITATION LATERAL               ANKLE LEFT GREATER THAN RIGHT WITH MILD CELLULITIS  Reproductive/Obstetrics negative OB ROS                             Anesthesia Physical  Anesthesia Plan  ASA: III  Anesthesia Plan: General   Post-op Pain Management:    Induction: Intravenous  PONV Risk Score and Plan: 2 and Ondansetron and Treatment may vary due to age or medical condition  Airway Management Planned: Oral ETT  Additional Equipment:   Intra-op Plan:   Post-operative  Plan: Extubation in OR  Informed Consent: I have reviewed the patients History and Physical, chart, labs and discussed the procedure including the risks, benefits and alternatives for the proposed anesthesia with the patient or authorized representative who has indicated his/her understanding and acceptance.     Dental advisory given  Plan Discussed with: CRNA and Surgeon  Anesthesia Plan Comments:         Anesthesia Quick Evaluation

## 2020-08-31 NOTE — Anesthesia Postprocedure Evaluation (Signed)
Anesthesia Post Note  Patient: Chase Clark  Procedure(s) Performed: LAPAROSCOPIC INSERTION CONTINUOUS AMBULATORY PERITONEAL DIALYSIS  (CAPD) CATHETER (N/A )  Patient location during evaluation: PACU Anesthesia Type: General Level of consciousness: awake and alert and oriented Pain management: pain level controlled Vital Signs Assessment: post-procedure vital signs reviewed and stable Respiratory status: spontaneous breathing Cardiovascular status: blood pressure returned to baseline Anesthetic complications: no   No complications documented.   Last Vitals:  Vitals:   08/30/20 1041 08/30/20 1106  BP: 129/74 (!) 170/77  Pulse: 65 65  Resp: 16 16  Temp: (!) 36.3 C   SpO2: 100% 98%    Last Pain:  Vitals:   08/30/20 1106  TempSrc:   PainSc: 2                  Aaren Krog

## 2020-09-01 ENCOUNTER — Other Ambulatory Visit: Payer: Self-pay | Admitting: Family Medicine

## 2020-09-14 ENCOUNTER — Other Ambulatory Visit: Payer: Self-pay

## 2020-09-14 ENCOUNTER — Ambulatory Visit (INDEPENDENT_AMBULATORY_CARE_PROVIDER_SITE_OTHER): Payer: No Typology Code available for payment source | Admitting: Surgery

## 2020-09-14 ENCOUNTER — Encounter: Payer: Self-pay | Admitting: Surgery

## 2020-09-14 VITALS — BP 119/75 | HR 68 | Temp 98.6°F | Ht 70.0 in | Wt 219.8 lb

## 2020-09-14 DIAGNOSIS — N185 Chronic kidney disease, stage 5: Secondary | ICD-10-CM

## 2020-09-14 DIAGNOSIS — Z09 Encounter for follow-up examination after completed treatment for conditions other than malignant neoplasm: Secondary | ICD-10-CM

## 2020-09-14 NOTE — Patient Instructions (Signed)
Please feel free to call our office with any questions or concerns.

## 2020-09-14 NOTE — Progress Notes (Signed)
09/14/2020  HPI: Chase Clark is a 55 y.o. male s/p laparoscopic assisted peritoneal dialysis catheter placement on 08/30/2020 for chronic kidney disease stage V.  Patient presents today for follow-up.  Catheter has been flushed twice at the dialysis center without any complications.  Patient reports that the initial discomfort after the surgery is now resolved.  Denies any troubles with the incisions.  Vital signs: BP 119/75   Pulse 68   Temp 98.6 F (37 C) (Oral)   Ht 5\' 10"  (1.778 m)   Wt 219 lb 12.8 oz (99.7 kg)   SpO2 97%   BMI 31.54 kg/m    Physical Exam: Constitutional: No acute distress Abdomen: Soft, nondistended, nontender to palpation.  Incisions are healing well and are clean, dry, intact.  Peritoneal dialysis catheter with exit site in the left abdomen with appropriate dressing in place.  Assessment/Plan: This is a 55 y.o. male s/p laparoscopic assisted peritoneal dialysis catheter placement.  - From the surgical standpoint, the patient is healing well without any evidence of infection or catheter complications at this point.  Discussed with the patient return precautions and that he should contact us if there is any concerns with the incisions for the catheter itself.  Otherwise he can follow-up with [place] on an as-needed basis.   Melvyn Neth, [facility]

## 2020-09-19 ENCOUNTER — Other Ambulatory Visit: Payer: Self-pay | Admitting: Internal Medicine

## 2020-09-19 DIAGNOSIS — H02831 Dermatochalasis of right upper eyelid: Secondary | ICD-10-CM | POA: Insufficient documentation

## 2020-09-27 ENCOUNTER — Telehealth: Payer: Self-pay

## 2020-09-27 ENCOUNTER — Telehealth: Payer: Self-pay | Admitting: Pharmacist

## 2020-09-27 NOTE — Telephone Encounter (Signed)
  Chronic Care Management   Note  09/27/2020 Name: Chase Clark MRN: [id number] DOB: 08/29/1965   Attempted to contact patient's wife for scheduled appointment for medication management support. [name] noted that they are at PD training right now and requested that we reschedule. Rescheduled for next week.   Catie Darnelle Maffucci, PharmD, [place], [place] Clinical Pharmacist [employer] at [place]

## 2020-10-01 ENCOUNTER — Other Ambulatory Visit: Payer: Self-pay | Admitting: Internal Medicine

## 2020-10-03 ENCOUNTER — Ambulatory Visit: Payer: Managed Care, Other (non HMO) | Admitting: Internal Medicine

## 2020-10-03 ENCOUNTER — Ambulatory Visit: Payer: Self-pay | Admitting: Pharmacist

## 2020-10-03 DIAGNOSIS — I739 Peripheral vascular disease, unspecified: Secondary | ICD-10-CM

## 2020-10-03 DIAGNOSIS — E103593 Type 1 diabetes mellitus with proliferative diabetic retinopathy without macular edema, bilateral: Secondary | ICD-10-CM

## 2020-10-03 DIAGNOSIS — I10 Essential (primary) hypertension: Secondary | ICD-10-CM

## 2020-10-03 DIAGNOSIS — E785 Hyperlipidemia, unspecified: Secondary | ICD-10-CM

## 2020-10-03 DIAGNOSIS — N186 End stage renal disease: Secondary | ICD-10-CM

## 2020-10-03 NOTE — Chronic Care Management (AMB) (Signed)
**Note Chase-Identified via Obfuscation** Care Management   Pharmacy Note  10/03/2020 Name: Chase Clark MRN: [id number] DOB: [birth year]  Subjective: Chase Clark is a 55 y.o. year old male who is a primary care patient of [name], [name], MD. The Care Management team was consulted for assistance with care management and care coordination needs.    Engaged with patient's wife, Chase Clark, by telephone for follow up visit in response to provider referral for pharmacy case management and/or care coordination services.   The patient was given information about Care Management services today including:  Care Management services includes personalized support from designated clinical staff supervised by the patient's primary care provider, including individualized plan of care and coordination with other care providers. 24/7 contact phone numbers for assistance for urgent and routine care needs. The patient may stop case management services at any time by phone call to the office staff.  Patient agreed to services and consent obtained.  Assessment:  Review of patient status, including review of consultants reports, laboratory and other test data, was performed as part of comprehensive evaluation and provision of chronic care management services.   SDOH (Social Determinants of Health) assessments and interventions performed:  SDOH Interventions    Flowsheet Row Most Recent Value  SDOH Interventions   Financial Strain Interventions Intervention Not Indicated        Objective:  Lab Results  Component Value Date   CREATININE 3.90 (H) 08/30/2020   CREATININE 5.50 (H) 08/11/2020   CREATININE 5.65 (H) 08/10/2020    Lab Results  Component Value Date   HGBA1C 7.0 (H) 06/05/2020       Component Value Date/Time   CHOL 119 06/05/2020 0838   TRIG 274.0 (H) 06/05/2020 0838   HDL 33.80 (L) 06/05/2020 0838   CHOLHDL 4 06/05/2020 0838   VLDL 54.8 (H) 06/05/2020 0838   LDLCALC 55 05/24/2019 0905   LDLDIRECT 43.0 06/05/2020  0838    Clinical ASCVD: Yes  The ASCVD Risk score (Goff DC Jr., et al., 2013) failed to calculate for the following reasons:   The patient has a prior MI or stroke diagnosis    BP Readings from Last 3 Encounters:  09/14/20 119/75  08/30/20 (!) 170/77  08/27/20 (!) 80/50    Care Plan  No Known Allergies  Medications Reviewed Today     Reviewed by Chase Clark, RPH-CPP (Pharmacist) on 10/03/20 at 1453  Med List Status: <None>   Medication Order Taking? Sig Documenting Provider Last Dose Status Informant  acetaminophen (TYLENOL) 325 MG tablet [id number] No Take 1 tablet (325 mg total) by mouth every 6 (six) hours as needed for mild pain (pain score 1-3 or temp > 100.5).  Patient not taking: Reported on 10/03/2020   Grier Mitts, PA-C Not Taking Active   aspirin (ASPIRIN CHILDRENS) 81 MG chewable tablet [id number] Yes Chew 1 tablet (81 mg total) by mouth daily. Lajuana Matte, MD Taking Active Spouse/Significant Other  atorvastatin (LIPITOR) 80 MG tablet [id number] Yes TAKE 1 TABLET BY MOUTH EVERY DAY  Patient taking differently: Take 80 mg by mouth at bedtime.   [name], MD Taking Active   brimonidine-timolol [facility]) 0.2-0.5 % ophthalmic solution [id number] Yes Place 1 drop into the right eye every 12 (twelve) hours.  [provider] Taking Active Spouse/Significant Other           Med Note Chanetta Marshall Feb 14, 2019  9:49 AM)    cholecalciferol (VITAMIN D3) 25 MCG (1000 UT) tablet  [id number] Yes Take 2,000 Units by mouth daily. [provider] Taking Active Spouse/Significant Other  Continuous Blood Gluc Sensor (DEXCOM G6 SENSOR) MISC [id number] Yes Inject 1 Device into the skin as directed. Place a new sensor every 10 days to check blood sugar at least 4 times daily Shamleffer, Melanie Crazier, MD Taking Active Spouse/Significant Other  Continuous Blood Gluc Transmit (DEXCOM G6 TRANSMITTER) MISC [id number] Yes Inject 1 Device  into the skin as directed. Use to check blood sugar at least 4 times daily Shamleffer, Melanie Crazier, MD Taking Active Spouse/Significant Other  dorzolamide (TRUSOPT) 2 % ophthalmic solution [id number] Yes Place 1 drop into both eyes 2 (two) times daily. [provider] Taking Active Spouse/Significant Other  esomeprazole (NEXIUM) 20 MG capsule [id number] Yes Take 20 mg by mouth daily. [provider] Taking Active Spouse/Significant Other  ezetimibe (ZETIA) 10 MG tablet [id number] Yes Take 1 tablet (10 mg total) by mouth daily. [name], MD Taking Active   fluticasone [facility]) 50 MCG/ACT nasal spray [id number] Yes SPRAY 2 SPRAYS INTO EACH NOSTRIL EVERY DAY [name], MD Taking Active   gabapentin (NEURONTIN) 300 MG capsule [id number] Yes Take 300 mg by mouth at bedtime. [provider] Taking Active   insulin degludec (TRESIBA FLEXTOUCH) 200 UNIT/ML FlexTouch Clark [id number] Yes Inject 40 Units into the skin 2 (two) times daily. [provider] Taking Active   insulin lispro (HUMALOG KWIKPEN) 100 UNIT/ML KwikPen [id number] Yes Inject into the skin. Inject up to 5 units three times daily with meals as needed [provider] Taking Active   Insulin Clark Needle 32G X 4 MM MISC [id number] Yes Inject 5 pens into the skin daily. Use to inject Lantus and Novolog up to 5 times daily Shamleffer, Melanie Crazier, MD Taking Active Spouse/Significant Other  lactulose (CHRONULAC) 10 GM/15ML solution [id number] Yes TAKE 30 MLS (20 G TOTAL) BY MOUTH AS NEEDED. Jerene Bears, MD Taking Active   latanoprost (XALATAN) 0.005 % ophthalmic solution [id number] Yes Place 1 drop into both eyes in the morning and at bedtime. [provider] Taking Active   metoCLOPramide (REGLAN) 10 MG tablet [id number] Yes TAKE 1 TABLET BY MOUTH EVERYDAY AT BEDTIME Pyrtle, Lajuan Lines, MD Taking Active   midodrine (PROAMATINE) 5 MG tablet [id number] Yes Take 5 mg by mouth 2 (two) times  daily as needed. [provider] Taking Active   PARoxetine (PAXIL) 10 MG tablet [id number] Yes Take 10 mg by mouth daily. [provider] Taking Active   senna (SENOKOT) 8.6 MG tablet [id number] Yes Take 2 tablets by mouth daily as needed for constipation. [provider] Taking Active Spouse/Significant Other  torsemide (DEMADEX) 20 MG tablet [id number] Yes Take 20 mg by mouth daily. 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 ([place]) 06/30/2019   Type 1 diabetes mellitus with stage 4 chronic kidney disease ([place]) 06/30/2019   Type 1 diabetes mellitus with diabetic polyneuropathy ([place]) 06/30/2019   Diabetes mellitus type I ([place]) 06/30/2019   Left below-knee amputee ([place]) 06/30/2019   Swelling of limb 06/07/2019   Vision changes 02/22/2019  S/P CABG x 2 01/27/2019   Nephrotic syndrome 01/19/2019   Allergic rhinitis 07/19/2018   Amputation of left lower extremity below knee ([place]) 09/09/2017   Benign prostatic hyperplasia    Hyperlipidemia    Chronic kidney disease (CKD), stage IV (severe) (HCC)    Neuropathic pain    Coronary artery disease involving native coronary artery of native heart without angina pectoris    Anemia of chronic disease    Cervical spondylosis with myelopathy and radiculopathy 05/07/2017   Fatty liver 04/04/2017   DDD (degenerative disc disease), cervical 01/13/2017   PAD (peripheral artery disease) ([place]) 08/07/2016   Risk for falls 07/28/2016   Diabetic Charcot foot ([place]) 07/21/2016   Nondisplaced fracture of left calcaneus 07/10/2016   Wound infection 07/10/2016    Acute osteomyelitis of right foot ([place]) 07/03/2016   Diabetic peripheral neuropathy ([place]) 07/03/2016   Chronic diarrhea 05/17/2016   Hyponatremia 05/17/2016   Peripheral vascular disease of extremity with claudication ([place]) 09/12/2015   Anemia 01/12/2014   Disorder of ligament of ankle 07/31/2013   Edema 06/28/2013   Impotence due to erectile dysfunction 06/12/2013   Obesity (BMI 30-39.9) 06/12/2013   Pain in joint, shoulder region 06/12/2013   Statin intolerance 06/10/2013   Hyperlipidemia associated with type 2 diabetes mellitus ([place]) 07/17/2006   GILBERT'S SYNDROME 07/17/2006   Hypertension associated with diabetes ([place]) 07/17/2006   Coronary atherosclerosis 07/17/2006   OSTEOARTHRITIS 07/17/2006    Conditions to be addressed/monitored: ESRD and diabetes  Care Plan : Medication Management  Updates made by Chase Clark, RPH-CPP since 10/03/2020 12:00 AM     Problem: Diabetes, CAD (hx CABG, HTN), CKD      Long-Range Goal: Disease Progression Prevention   This Visit's Progress: On track  Recent Progress: On track  Priority: High  Note:   Current Barriers:  Unable to achieve control of diabetes, hypertension   Pharmacist Clinical Goal(s):  Over the next 90 days, patient will achieve adherence to monitoring guidelines and medication adherence to achieve therapeutic efficacy through collaboration with PharmD and provider.    Interventions: 1:1 collaboration with [name], MD regarding development and update of comprehensive plan of care as evidenced by provider attestation and co-signature Inter-disciplinary care team collaboration (see longitudinal plan of care) Comprehensive medication review performed; medication list updated in electronic medical record  Health Maintenance: Asks if Blas should receive booster dose. Discussed that given his dialysis state, I would recommend being up to date on COVID vaccinations. Terra verbalizes understanding Recommend  pneumococcal vaccination Recommend eye exam, foot exam  Diabetes, treated as T1: Controlled; current treatment: Tresiba 40 units BID, Novolog PRN up to 5 units TID  Hx significant GI upset, occular migraines w/ Ozempic ESRD limits metformin, SGLT2 Continue current regimen along with endocrinology collaboration at this time.  Hypertension with ESRD, using PD Controlled; current treatment: midodrine 5 mg BID PRN SBP <100 - though has not required; torsemide 20 mg QAM LEE w/ amlodipine Current home BP readings: Chase Clark reports at goal <130 since being on dialysis Continue current regimen along with collaboration with nephrology, cardiology.   Hyperlipidemia: Controlled per last lipid panel; current treatment: atorvastatin 80 mg daily, ezetimibe 10 mg daily No dose adjustments needed given dialysis status Current antiplatelet regimen: aspirin 81 mg daily Recommend to continue current regimen at this time.   Allergies: Controlled; current using fluticasone nasal spray PRN Recommend to continue current regimen at this time.   GI (GERD hx Barrett's, gastroparesis, family hx NASH): Appropriately managed  per current regimen: omeprazole 20 mg daily for GERD; senna daily for constipation, lactulose QAM No dose adjustments needed given dialysis status Recommend to continue current regimen with dietary modifications at this time  Supplements: Vitamin D 2000 units daily; reports they will discuss a renal vitamin with nephrology tomorrow   Patient Goals/Self-Care Activities Over the next 90 days, patient will:  - take medications as prescribed check glucose at least at least three times daily using CGM, document, and provide at future appointments check blood pressure daily, document, and provide at future appointments Communicate with specialty providers  Follow Up Plan: Telephone follow up appointment with care management team member scheduled for: ~ 12 weeks     Medication Assistance:   None required.  Patient affirms current coverage meets needs.  Follow Up:  Patient agrees to Care Plan and Follow-up.  Plan: Telephone follow up appointment with care management team member scheduled for:  ~ 12 weeks  Catie Darnelle Maffucci, PharmD, [place], [place] Clinical Pharmacist [employer] at [place] [facility] [place] [phone]

## 2020-10-03 NOTE — Patient Instructions (Addendum)
Visit Information   Goals Addressed               This Visit's Progress     Patient Stated     Medication Monitoring (pt-stated)        Patient Goals/Self-Care Activities Over the next 90 days, patient will:  - take medications as prescribed check glucose continuously using CGM, document, and provide at future appointments check blood pressure daily, document, and provide at future appointments Communicate with specialty providers         Patient verbalizes understanding of instructions provided today and agrees to view in [place].   Plan: Telephone follow up appointment with care management team member scheduled for:  ~ 12 weeks  Catie Darnelle Maffucci, PharmD, [place], [place] Clinical Pharmacist [employer] at [place] [facility] [place] [phone]

## 2020-10-09 ENCOUNTER — Encounter: Payer: Self-pay | Admitting: *Deleted

## 2020-10-15 ENCOUNTER — Other Ambulatory Visit: Payer: Self-pay | Admitting: Internal Medicine

## 2020-10-22 ENCOUNTER — Telehealth: Payer: Self-pay

## 2020-10-28 ENCOUNTER — Other Ambulatory Visit: Payer: Self-pay | Admitting: Internal Medicine

## 2020-10-29 NOTE — Telephone Encounter (Signed)
I do not see anything regarding a message about gabapentin in my paper in basket.

## 2020-10-30 ENCOUNTER — Telehealth: Payer: Self-pay | Admitting: Internal Medicine

## 2020-10-30 NOTE — Telephone Encounter (Signed)
Pt's wife called requesting a call from a nurse, she states she has a few questions regarding her husband's blood glucose and dialysis.    Wife [name]: Ph# [phone]

## 2020-11-01 NOTE — Telephone Encounter (Signed)
LVM to pt wife [name]) to call the office back regarding this questions regarding pt blood glucose.

## 2020-11-02 ENCOUNTER — Other Ambulatory Visit: Payer: Self-pay | Admitting: Internal Medicine

## 2020-11-02 DIAGNOSIS — E103593 Type 1 diabetes mellitus with proliferative diabetic retinopathy without macular edema, bilateral: Secondary | ICD-10-CM

## 2020-11-02 NOTE — Telephone Encounter (Signed)
Spoke to pt wife --stated pt taking Dextro (dialysis) qh and sugar increase to 300 at night. Please advise.

## 2020-11-02 NOTE — Telephone Encounter (Signed)
Pt have an appt 11/08/20 with Dr. Kelton Pillar.

## 2020-11-08 ENCOUNTER — Ambulatory Visit: Payer: Managed Care, Other (non HMO) | Admitting: Internal Medicine

## 2020-11-08 ENCOUNTER — Telehealth: Payer: Self-pay | Admitting: Internal Medicine

## 2020-11-08 NOTE — Telephone Encounter (Signed)
error 

## 2020-11-08 NOTE — Progress Notes (Deleted)
Name: Chase Clark  Age/ Sex: 55 y.o., male   MRN/ DOB: [id number], 1965/05/25     PCP: [name], MD   Reason for Endocrinology Evaluation: Type 1 Diabetes Mellitus  Initial Endocrine Consultative Visit: 06/29/2019    PATIENT IDENTIFIER: Chase Clark is a 55 y.o. male with a past medical history of T1DM, HTN, CAD (S/P DES 2011), gastroparesis, ESRD on PD (08/2020). The patient has followed with Endocrinology clinic since 06/29/2019 for consultative assistance with management of his diabetes.  DIABETIC HISTORY:  Chase Clark was diagnosed with  T1DM in 1990, was initially diagnosed as type 2 until years ago when he was diagnosed with T1DM, has been on insulin for ~ 15 yrs. He tried Ozempic 04/2019 with GI intolerance. His hemoglobin A1c has ranged from 5.9%  in 2021, peaking at 15.3%in 2019.    SUBJECTIVE:   During the last visit (02/22/2020): A1c 9.0 %. Adjusted MDi regimen   Today (11/08/2020): Chase Clark is here for a follow up on diabetes management. He has not been here in 8 months.  He is accompanied by his wife today.He checks his glucose multiple times a day through the dexcom  Since his last visit here he started PD  Follows with Dr. 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, + 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  Last Eye Exam: Completed 06/2019   Macrovascular complications:  CAD (S/P CABG 01/2019)  Denies: CVA, PVD   HISTORY:  Past Medical History:  Past Medical History:  Diagnosis Date   Anemia    Arthritis    Asthma    as child   Barrett's esophagus 02/16/2014   short segment   Cataract    Chronic kidney disease (CKD), stage IV (severe) (HCC)    Coronary artery  disease    DDD (degenerative disc disease), cervical    Diabetes mellitus without complication ([place])    Diabetic osteomyelitis ([place])    Diabetic peripheral neuropathy ([place])    Diabetic ulcer of foot with muscle involvement without evidence of necrosis ([place])    DIABETIC ULCERATIONS ASSOCIATED WITH IRRITATION LATERAL ANKLE LEFT GREATER THAN RIGHT WITH MILD CELLULITIS    Diverticulosis    DKA (diabetic ketoacidoses) 08/23/2017   Edema, lower extremity    Fatty liver    Gastroparesis 2015   GERD (gastroesophageal reflux disease)    Gilbert's disease    Glaucoma    Hx of BKA, left (HCC)    Hx of CABG 01/27/2019   2 vessels; LIMA-LAD, SVG-D1   Hx of heart artery stent 11/20/2009   80% D1 stenosis - 2.5 x 18 mm Xience V Everolimus (DES) stent x 1 placed   Hyperlipidemia    Hypertension    Left below-knee amputee ([place])    Legally blind    Myocardial infarction ([place]) 11/2009   Neuropathy    Osteoarthritis of spine    Osteomyelitis of left foot (HCC)    PONV (postoperative nausea and vomiting)    PVD (peripheral vascular disease) (HCC)    Seasonal allergies    Shoulder pain    Tubular adenoma of colon    Ulcer of  foot ([place]) 08.06.2014   DIABETIC ULCERATIONS ASSOCIATED WITH IRRITATION LATERAL ANKLE LEFT GREATER THAN RIGHT WITH MILD CELLULITIS   Past Surgical History:  Past Surgical History:  Procedure Laterality Date   ABDOMINAL AORTOGRAM N/A 05/20/2016   Procedure: Abdominal Aortogram possible intervention;  Surgeon: Katha Cabal, MD;  Location: [facility];  Service: Cardiovascular;  Laterality: N/A;   AMPUTATION Left 09/05/2017   Procedure: AMPUTATION BELOW KNEE;  Surgeon: Tania Ade, MD;  Location: [facility];  Service: Orthopedics;  Laterality: Left;   ANTERIOR CERVICAL DECOMP/DISCECTOMY FUSION N/A 05/07/2017   Procedure: ANTERIOR CERVICAL DECOMPRESSION/DISCECTOMY Luevenia Maxin PROSTHESIS,PLATE/SCREWS CERVICAL FIVE - CERVICAL SIX;  Surgeon: Newman Pies, MD;   Location: [facility];  Service: Neurosurgery;  Laterality: N/A;   CAPD INSERTION N/A 08/30/2020   Procedure: LAPAROSCOPIC INSERTION CONTINUOUS AMBULATORY PERITONEAL DIALYSIS  (CAPD) CATHETER;  Surgeon: Olean Ree, MD;  Location: [facility];  Service: General;  Laterality: N/A;   CATARACT EXTRACTION W/ INTRAOCULAR LENS IMPLANT     CATARACT EXTRACTION W/PHACO Left 02/26/2017   Procedure: CATARACT EXTRACTION PHACO AND INTRAOCULAR LENS PLACEMENT ([place]);  Surgeon: Eulogio Bear, MD;  Location: [facility];  Service: Ophthalmology;  Laterality: Left;  Lot # [id number] H [place]: 00:25.3 AP%: 6.2 CDE: 1.59    CHOLECYSTECTOMY N/A    CORONARY ANGIOPLASTY WITH STENT PLACEMENT  11/20/2009   80% D1 stenosis - 2.5 x 18 mm Xience V Everolimus (DES) stent x 1 placed; LOCATION: [facility]; SURGEON: Bartholome Bill, MD   CORONARY ARTERY BYPASS GRAFT N/A 01/27/2019   Procedure: OFF PUMP CORONARY ARTERY BYPASS GRAFTING (CABG) X 2 WITH ENDOSCOPIC HARVESTING OF RIGHT GREATER SAPHENOUS VEIN. LIMA TO LAD;  Surgeon: Lajuana Matte, MD;  Location: [facility];  Service: Open Heart Surgery;  Laterality: N/A;   CORONARY STENT INTERVENTION N/A 01/21/2019   Procedure: CORONARY STENT INTERVENTION;  Surgeon: Belva Crome, MD;  Location: [facility];  Service: Cardiovascular;  Laterality: N/A;   EPIBLEPHERON REPAIR WITH TEAR DUCT PROBING     EYE SURGERY Right 02/09/2014   cataract extraction   INSERTION EXPRESS TUBE SHUNT Right 09/06/2015   Procedure: INSERTION AHMED TUBE SHUNT with tutoplast allograft;  Surgeon: Eulogio Bear, MD;  Location: [facility];  Service: Ophthalmology;  Laterality: Right;   laparoscopic insertion continuous peritoneal dialysis  2022   LEFT HEART CATH AND CORONARY ANGIOGRAPHY N/A 01/20/2019   Procedure: LEFT HEART CATH AND CORONARY ANGIOGRAPHY;  Surgeon: Lorretta Harp, MD;  Location: [facility];  Service: Cardiovascular;  Laterality: N/A;   TEE WITHOUT CARDIOVERSION N/A 01/27/2019   Procedure:  TRANSESOPHAGEAL ECHOCARDIOGRAM (TEE);  Surgeon: Lajuana Matte, MD;  Location: [facility];  Service: Open Heart Surgery;  Laterality: N/A;   VASECTOMY     Social History:  reports that he quit smoking about 12 years ago. His smoking use included cigarettes. He has never used smokeless tobacco. He reports current alcohol use. He reports that he does not use drugs. Family History:  Family History  Problem Relation Age of Onset   Hyperlipidemia Mother    Diabetes Mother    Liver disease Mother        NASH   Other Mother        immune thrombocytopenic pupura (ITP)   Hyperlipidemia Father    Diabetes Father    Heart disease Father    Hypertension Father    Lymphoma Father    Hemochromatosis Other        Grandmother (? which side)  Polycythemia Other    Colon cancer Neg Hx    Esophageal cancer Neg Hx    Rectal cancer Neg Hx    Stomach cancer Neg Hx      HOME MEDICATIONS: Allergies as of 11/08/2020   No Known Allergies      Medication List        Accurate as of November 08, 2020  6:56 AM. If you have any questions, ask your nurse or doctor.          acetaminophen 325 MG tablet Commonly known as: TYLENOL Take 1 tablet (325 mg total) by mouth every 6 (six) hours as needed for mild pain (pain score 1-3 or temp > 100.5).   aspirin 81 MG chewable tablet Commonly known as: Aspirin Childrens Chew 1 tablet (81 mg total) by mouth daily.   atorvastatin 80 MG tablet Commonly known as: LIPITOR TAKE 1 TABLET BY MOUTH EVERY DAY What changed: when to take this   brimonidine-timolol 0.2-0.5 % ophthalmic solution Commonly known as: COMBIGAN Place 1 drop into the right eye every 12 (twelve) hours.   cholecalciferol 25 MCG (1000 UNIT) tablet Commonly known as: VITAMIN D3 Take 2,000 Units by mouth daily.   Dexcom G6 Sensor Misc Inject 1 Device into the skin as directed. Place a new sensor every 10 days to check blood sugar at least 4 times daily   Dexcom G6 Transmitter  Misc Inject 1 Device into the skin as directed. Use to check blood sugar at least 4 times daily   dorzolamide 2 % ophthalmic solution Commonly known as: TRUSOPT Place 1 drop into both eyes 2 (two) times daily.   esomeprazole 20 MG capsule Commonly known as: NEXIUM Take 20 mg by mouth daily.   ezetimibe 10 MG tablet Commonly known as: Zetia Take 1 tablet (10 mg total) by mouth daily.   fluticasone 50 MCG/ACT nasal spray Commonly known as: FLONASE SPRAY 2 SPRAYS INTO EACH NOSTRIL EVERY DAY   gabapentin 300 MG capsule Commonly known as: NEURONTIN Take 300 mg by mouth at bedtime.   HumaLOG KwikPen 100 UNIT/ML KwikPen Generic drug: insulin lispro INJECT 6 UNITS INTO THE SKIN 3 (THREE) TIMES DAILY. INJECT UP TO 15 UNITS 3 TIMES WITH MEALS   Insulin Pen Needle 32G X 4 MM Misc Inject 5 pens into the skin daily. Use to inject Lantus and Novolog up to 5 times daily   lactulose 10 GM/15ML solution Commonly known as: CHRONULAC TAKE 30 MLS (20 G TOTAL) BY MOUTH AS NEEDED.   latanoprost 0.005 % ophthalmic solution Commonly known as: XALATAN Place 1 drop into both eyes in the morning and at bedtime.   metoCLOPramide 10 MG tablet Commonly known as: REGLAN TAKE 1 TABLET BY MOUTH EVERYDAY AT BEDTIME   midodrine 5 MG tablet Commonly known as: PROAMATINE Take 5 mg by mouth 2 (two) times daily as needed.   PARoxetine 10 MG tablet Commonly known as: PAXIL Take 10 mg by mouth daily.   senna 8.6 MG tablet Commonly known as: SENOKOT Take 2 tablets by mouth daily as needed for constipation.   torsemide 20 MG tablet Commonly known as: DEMADEX Take 20 mg by mouth daily.   Tyler Aas FlexTouch 200 UNIT/ML FlexTouch Pen Generic drug: insulin degludec Inject 40 Units into the skin 2 (two) times daily.         OBJECTIVE:   Vital Signs: There were no vitals taken for this visit.  Wt Readings from Last 3 Encounters:  09/14/20 219 lb 12.8 oz (  99.7 kg)  08/27/20 231 lb 6.4 oz (105  kg)  08/17/20 230 lb (104.3 kg)     Exam: General: Pt appears well and is in NAD  Lungs: Clear with good BS bilat with no rales, rhonchi, or wheezes  Heart: RRR with normal S1 and S2 and no gallops; no murmurs; no rub  Abdomen: Normoactive bowel sounds, soft, nontender, without masses or organomegaly palpable  Extremities: No pretibial edema. No tremor. Normal strength and motion throughout. See detailed diabetic foot exam below.  Neuro: MS is good with appropriate affect, pt is alert and Ox3      DM foot exam: 06/29/2019  Right foot exam , prosthetic on the left  The skin of the feet is intact without sores or ulcerations. The pedal pulses are 1+ on right  The sensation is decreased  to a screening 5.07, 10 gram monofilament on the right      DATA REVIEWED:  Lab Results  Component Value Date   HGBA1C 7.0 (H) 06/05/2020   HGBA1C 9.0 (A) 02/22/2020   HGBA1C 5.9 05/24/2019   Lab Results  Component Value Date   MICROALBUR 13.5 (H) 01/06/2014   LDLCALC 55 05/24/2019   CREATININE 3.90 (H) 08/30/2020   Lab Results  Component Value Date   MICRALBCREAT 15.5 01/06/2014     Lab Results  Component Value Date   CHOL 119 06/05/2020   HDL 33.80 (L) 06/05/2020   LDLCALC 55 05/24/2019   LDLDIRECT 43.0 06/05/2020   TRIG 274.0 (H) 06/05/2020   CHOLHDL 4 06/05/2020         ASSESSMENT / PLAN / RECOMMENDATIONS:   1)Type 1 Diabetes Mellitus, Poorly controlled, With  CKD IV, Hx of left BKA, retinopathy and macrovascular complications - Most recent A1c of 9.0 %. Goal A1c < 7.5 %.    - Worsening A1c  - There was a period of time where he wasn't checking glucose due to malfunctioning sensors.  - We were unable to open an account for Dexcom on last visit as he was unable to read the code, today we sent info to wife to initiate the process .  - He continues to use too much basal insulin , but he had a hypoglycemic episode last night,6.5 hrs after last dose of humalog, this is most  likely due to basal insulin, will reduce as below - I am not going to change his prandial dose due to lack of glucose data - He is not using correction scale due to visual impairment  - I offered to send a prescription for a talking meter but he is unable to prick finger due to visual impairment  - Insulin and sensors refilled today   MEDICATIONS: -Decrease Basaglar to 40  units twice a day  -Humalog 6 units with each meal   EDUCATION / INSTRUCTIONS: BG monitoring instructions: Patient is instructed to check his blood sugars 4 times a day, before meals and bedtime  Call [facility] if: BG persistently < 70  I reviewed the Rule of 15 for the treatment of hypoglycemia in detail with the patient. Literature supplied.   2) Diabetic complications:  Eye: Does  have known diabetic retinopathy.  Neuro/ Feet: Does have known diabetic peripheral neuropathy .  Renal: Patient does  have known baseline CKD. He   is on an ACEI/ARB at present.       F/U in 6 months    Signed electronically by: Mack Guise, MD  [facility]  [facility] [address] [place], [place] [address] Phone: [phone] FAX: [phone]   CC: [name], MD [address] [place] [address] Phone: [phone]  Fax: [phone]  Return to Endocrinology clinic as below: Future Appointments  Date Time Provider [place]  11/08/2020  8:10 AM Laxmi Choung, Melanie Crazier, MD [facility] None  11/20/2020  9:30 AM Pyrtle, Lajuan Lines, MD [facility]  12/26/2020  2:00 PM [facility] PHARMACIST [facility]

## 2020-11-08 NOTE — Telephone Encounter (Signed)
Pt's wife calling in stating that pt received his booster of covid and is not feeling well so had to change the  app on 11/09/2020 changed to 01/04/2021   Pt's sugars have been great except when hooking up to dialysis.they have been running high the insulin brings it down a little but sugars jump up around 300  when treatment is given and treatment last all night. Pt's wife would like a call back as soon as possible

## 2020-11-09 ENCOUNTER — Encounter: Payer: Self-pay | Admitting: Internal Medicine

## 2020-11-09 ENCOUNTER — Other Ambulatory Visit: Payer: Self-pay | Admitting: Internal Medicine

## 2020-11-09 ENCOUNTER — Ambulatory Visit: Payer: Managed Care, Other (non HMO) | Admitting: Internal Medicine

## 2020-11-09 MED ORDER — NOVOLIN N FLEXPEN 100 UNIT/ML ~~LOC~~ SUPN: 10.0000 [IU] | PEN_INJECTOR | Freq: Every day | SUBCUTANEOUS | 11 refills | Status: DC

## 2020-11-09 NOTE — Telephone Encounter (Signed)
Dr Kelton Pillar,  Marolyn Hammock asked to confirm that we can still schedule patient at an acute slot due to need to discuss new insulin since they cancel today appointment 11/08/2020

## 2020-11-09 NOTE — Telephone Encounter (Signed)
Spoken to patient's wife and notified Dr Quin Hoop comments. Verbalized understanding.

## 2020-11-09 NOTE — Progress Notes (Signed)
I received a message about the patient starting peritoneal dialysis and having early morning hyperglycemia. I have not seen the patient in 8 months, he was supposed to see me today but he canceled the same day again.   NPH 10 units will be prescribed to be taken at the start of each dialysis daily.  This will remain with [place] and NovoLog  A warning letter will be sent due to multiple missed appointments   Abby Nena Jordan, MD  [facility]  [facility] [address] [place], [place] [address] Phone: [phone] FAX: [phone]

## 2020-11-13 ENCOUNTER — Telehealth: Payer: Self-pay | Admitting: Internal Medicine

## 2020-11-13 NOTE — Telephone Encounter (Signed)
Pt wife states that she needs to know what to do regarding the insulin tonight for the dialysis.

## 2020-11-13 NOTE — Telephone Encounter (Signed)
Spoke with patient wife , per dr Kelton Pillar pt will take 10 units of nph at start of dialysis, and make he taking Novolog and [place] as prescribed. Stressed to patient wife that he needs come in for an appt to see Dr Kelton Pillar to discuss his insulin with her because patient hasn't been in year. Made an follow up appt for 11/15/20 and stress to patient wife the importance of keeping this appt.

## 2020-11-13 NOTE — Telephone Encounter (Signed)
Patient's wife [name] requests to be called at ph# [phone] re: Humalin N is not working and would like to discuss the medication

## 2020-11-13 NOTE — Telephone Encounter (Signed)
Tried to call patient , lvm for his wife to call us back  regarding message to get more information to put this up for Dr.Shamleffer.

## 2020-11-15 ENCOUNTER — Telehealth: Payer: Self-pay | Admitting: Internal Medicine

## 2020-11-15 ENCOUNTER — Encounter: Payer: Self-pay | Admitting: Internal Medicine

## 2020-11-15 ENCOUNTER — Ambulatory Visit (INDEPENDENT_AMBULATORY_CARE_PROVIDER_SITE_OTHER): Payer: Managed Care, Other (non HMO) | Admitting: Internal Medicine

## 2020-11-15 ENCOUNTER — Other Ambulatory Visit: Payer: Self-pay

## 2020-11-15 VITALS — BP 110/74 | HR 77 | Ht 70.0 in | Wt 233.0 lb

## 2020-11-15 DIAGNOSIS — E1042 Type 1 diabetes mellitus with diabetic polyneuropathy: Secondary | ICD-10-CM

## 2020-11-15 DIAGNOSIS — N186 End stage renal disease: Secondary | ICD-10-CM

## 2020-11-15 DIAGNOSIS — E103593 Type 1 diabetes mellitus with proliferative diabetic retinopathy without macular edema, bilateral: Secondary | ICD-10-CM | POA: Diagnosis not present

## 2020-11-15 DIAGNOSIS — N184 Chronic kidney disease, stage 4 (severe): Secondary | ICD-10-CM | POA: Diagnosis not present

## 2020-11-15 DIAGNOSIS — E1059 Type 1 diabetes mellitus with other circulatory complications: Secondary | ICD-10-CM | POA: Diagnosis not present

## 2020-11-15 DIAGNOSIS — E1139 Type 2 diabetes mellitus with other diabetic ophthalmic complication: Secondary | ICD-10-CM

## 2020-11-15 DIAGNOSIS — E1165 Type 2 diabetes mellitus with hyperglycemia: Secondary | ICD-10-CM

## 2020-11-15 DIAGNOSIS — IMO0002 Reserved for concepts with insufficient information to code with codable children: Secondary | ICD-10-CM

## 2020-11-15 DIAGNOSIS — E1022 Type 1 diabetes mellitus with diabetic chronic kidney disease: Secondary | ICD-10-CM

## 2020-11-15 DIAGNOSIS — Z89512 Acquired absence of left leg below knee: Secondary | ICD-10-CM

## 2020-11-15 DIAGNOSIS — Z992 Dependence on renal dialysis: Secondary | ICD-10-CM | POA: Insufficient documentation

## 2020-11-15 LAB — POCT GLYCOSYLATED HEMOGLOBIN (HGB A1C): Hemoglobin A1C: 6.6 % — AB (ref 4.0–5.6)

## 2020-11-15 MED ORDER — TRESIBA FLEXTOUCH 200 UNIT/ML ~~LOC~~ SOPN: 40.0000 [IU] | PEN_INJECTOR | Freq: Two times a day (BID) | SUBCUTANEOUS | 3 refills | Status: DC

## 2020-11-15 MED ORDER — NOVOLIN N FLEXPEN 100 UNIT/ML ~~LOC~~ SUPN: 25.0000 [IU] | PEN_INJECTOR | Freq: Every day | SUBCUTANEOUS | 6 refills | Status: DC

## 2020-11-15 MED ORDER — INSULIN LISPRO (1 UNIT DIAL) 100 UNIT/ML (KWIKPEN): PEN_INJECTOR | SUBCUTANEOUS | 3 refills | Status: DC

## 2020-11-15 MED ORDER — INSULIN PEN NEEDLE 32G X 4 MM MISC: 1.0000 | Freq: Every day | 3 refills | Status: DC

## 2020-11-15 NOTE — Telephone Encounter (Signed)
Vaughan Basta,   When you get a chance can you please see if you could help with this pt's Dexcom download.     He confirms he uses the receiver but when the nurse tried to download it, there was no data at all.   Him and his wife  have the app on their phones but they say they don't use it.    I am not sure if there's a way to stream line this process ?   Thanks a lot

## 2020-11-15 NOTE — Progress Notes (Signed)
Name: Chase Clark  Age/ Sex: 55 y.o., male   MRN/ DOB: [id number], [birth year]     PCP: [name], MD   Reason for Endocrinology Evaluation: Type 1 Diabetes Mellitus  Initial Endocrine Consultative Visit: 06/29/2019    PATIENT IDENTIFIER: Chase Clark is a 55 y.o. male with a past medical history of T1DM, HTN, CAD (S/P DES 2011), gastroparesis, ESRD on PD (08/2020). The patient has followed with Endocrinology clinic since 06/29/2019 for consultative assistance with management of his diabetes.  DIABETIC HISTORY:  Chase Clark was diagnosed with  T1DM in 1990, was initially diagnosed as type 2 until years ago when he was diagnosed with T1DM, has been on insulin for ~ 15 yrs. He tried Ozempic 04/2019 with GI intolerance. His hemoglobin A1c has ranged from 5.9%  in 2021, peaking at 15.3%in 2019.    SUBJECTIVE:   During the last visit (02/22/2020): A1c 9.0 %. Adjusted MDi regimen      Today (11/15/2020): Chase Clark is here for a follow up on diabetes management. He has not been here in 8 months.  He is accompanied by his wife Baxter Flattery today.He checks his glucose multiple times a day through the dexcom  Since his last visit here he started PD  Follows with Dr. Hilarie Fredrickson for colonic adenomas and elevated LFT's   Has noted morning hyperglycemia with PD, started at 9 pm and lasts for 9 hours  Extronel during the day 2.5 L  At night uses two bags of 6 Liters 2.5 % dextrose ~ 300 grams of CHO   HOME DIABETES REGIMEN:  Tresiba 40 units BID Humalog  6 units with each meal - takes 8-10 units    NPH 10 units      Statin: yes ACE-I/ARB: yes    CONTINUOUS GLUCOSE MONITORING RECORD INTERPRETATION    Dates of Recording: 7/21-11/15/2020  Sensor description:dexcom   Results statistics:   CGM use % of time 95.4  Average and SD 205  Time in range      42  %  % Time Above 180 29  % Time above 250 29  % Time Below target 0   Glycemic patterns summary: Hyperglycemia starts ar  9 pm and lasts through the night but trends down in the morning   Hyperglycemic episodes  start at 9 pm and lasts all night   Hypoglycemic episodes occurred during the day  Overnight periods: high       DIABETIC COMPLICATIONS: Microvascular complications:  Neuropathy, CKD IV , 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 Anemia   . Arthritis   . Asthma    as child  . Barrett's esophagus 02/16/2014   short segment  . Cataract   . Chronic kidney disease (CKD), stage IV (severe) ([place])   . Coronary artery disease   . DDD (degenerative disc disease), cervical   . Diabetes mellitus without complication ([place])   . Diabetic osteomyelitis ([place])   . Diabetic peripheral neuropathy ([place])   . Diabetic ulcer of foot with muscle involvement without evidence of necrosis ([place])    DIABETIC ULCERATIONS ASSOCIATED WITH IRRITATION LATERAL ANKLE LEFT GREATER THAN RIGHT WITH MILD CELLULITIS   . Diverticulosis   . DKA (diabetic ketoacidoses) 08/23/2017  . Edema, lower extremity   . Fatty liver   .  Gastroparesis 2015  . GERD (gastroesophageal reflux disease)   . Gilbert's disease   . Glaucoma   . Hx of BKA, left ([place])   . Hx of CABG 01/27/2019   2 vessels; LIMA-LAD, SVG-D1  . Hx of heart artery stent 11/20/2009   80% D1 stenosis - 2.5 x 18 mm Xience V Everolimus (DES) stent x 1 placed  . Hyperlipidemia   . Hypertension   . Left below-knee amputee ([place])   . Legally blind   . Myocardial infarction ([place]) 11/2009  . Neuropathy   . Osteoarthritis of spine   . Osteomyelitis of left foot ([place])   . PONV (postoperative nausea and vomiting)   . PVD (peripheral vascular disease) ([place])   . Seasonal allergies   . Shoulder pain   . Tubular adenoma of colon   . Ulcer of foot ([place]) 08.06.2014   DIABETIC ULCERATIONS  ASSOCIATED WITH IRRITATION LATERAL ANKLE LEFT GREATER THAN RIGHT WITH MILD CELLULITIS   Past Surgical History:  Past Surgical History:  Procedure Laterality Date  . ABDOMINAL AORTOGRAM N/A 05/20/2016   Procedure: Abdominal Aortogram possible intervention;  Surgeon: Katha Cabal, MD;  Location: [facility];  Service: Cardiovascular;  Laterality: N/A;  . AMPUTATION Left 09/05/2017   Procedure: AMPUTATION BELOW KNEE;  Surgeon: Tania Ade, MD;  Location: [facility];  Service: Orthopedics;  Laterality: Left;  . ANTERIOR CERVICAL DECOMP/DISCECTOMY FUSION N/A 05/07/2017   Procedure: ANTERIOR CERVICAL DECOMPRESSION/DISCECTOMY Luevenia Maxin PROSTHESIS,PLATE/SCREWS CERVICAL FIVE - CERVICAL SIX;  Surgeon: Newman Pies, MD;  Location: [facility];  Service: Neurosurgery;  Laterality: N/A;  . CAPD INSERTION N/A 08/30/2020   Procedure: LAPAROSCOPIC INSERTION CONTINUOUS AMBULATORY PERITONEAL DIALYSIS  (CAPD) CATHETER;  Surgeon: Olean Ree, MD;  Location: [facility];  Service: General;  Laterality: N/A;  . CATARACT EXTRACTION W/ INTRAOCULAR LENS IMPLANT    . CATARACT EXTRACTION W/PHACO Left 02/26/2017   Procedure: CATARACT EXTRACTION PHACO AND INTRAOCULAR LENS PLACEMENT (IOC);  Surgeon: Eulogio Bear, MD;  Location: [facility];  Service: Ophthalmology;  Laterality: Left;  Lot # [id number] H [place]: 00:25.3 AP%: 6.2 CDE: 1.59   . CHOLECYSTECTOMY N/A   . CORONARY ANGIOPLASTY WITH STENT PLACEMENT  11/20/2009   80% D1 stenosis - 2.5 x 18 mm Xience V Everolimus (DES) stent x 1 placed; LOCATION: [facility]; SURGEON: Bartholome Bill, MD  . CORONARY ARTERY BYPASS GRAFT N/A 01/27/2019   Procedure: OFF PUMP CORONARY ARTERY BYPASS GRAFTING (CABG) X 2 WITH ENDOSCOPIC HARVESTING OF RIGHT GREATER SAPHENOUS VEIN. LIMA TO LAD;  Surgeon: Lajuana Matte, MD;  Location: [facility];  Service: Open Heart Surgery;  Laterality: N/A;  . CORONARY STENT INTERVENTION N/A 01/21/2019   Procedure: CORONARY STENT INTERVENTION;   Surgeon: Belva Crome, MD;  Location: [facility];  Service: Cardiovascular;  Laterality: N/A;  . EPIBLEPHERON REPAIR WITH TEAR DUCT PROBING    . EYE SURGERY Right 02/09/2014   cataract extraction  . INSERTION EXPRESS TUBE SHUNT Right 09/06/2015   Procedure: INSERTION AHMED TUBE SHUNT with tutoplast allograft;  Surgeon: Eulogio Bear, MD;  Location: [facility];  Service: Ophthalmology;  Laterality: Right;  . laparoscopic insertion continuous peritoneal dialysis  2022  . LEFT HEART CATH AND CORONARY ANGIOGRAPHY N/A 01/20/2019   Procedure: LEFT HEART CATH AND CORONARY ANGIOGRAPHY;  Surgeon: Lorretta Harp, MD;  Location: [facility];  Service: Cardiovascular;  Laterality: N/A;  . TEE WITHOUT CARDIOVERSION N/A 01/27/2019   Procedure: TRANSESOPHAGEAL ECHOCARDIOGRAM (TEE);  Surgeon: Lajuana Matte, MD;  Location:  [facility];  Service: Open Heart Surgery;  Laterality: N/A;  . VASECTOMY     Social History:  reports that he quit smoking about 12 years ago. His smoking use included cigarettes. He has never used smokeless tobacco. He reports current alcohol use. He reports that he does not use drugs. Family History:  Family History  Problem Relation Age of Onset  . Hyperlipidemia Mother   . Diabetes Mother   . Liver disease Mother        NASH  . Other Mother        immune thrombocytopenic pupura (ITP)  . Hyperlipidemia Father   . Diabetes Father   . Heart disease Father   . Hypertension Father   . Lymphoma Father   . Hemochromatosis Other        Grandmother (? which side)  . Polycythemia Other   . Colon cancer Neg Hx   . Esophageal cancer Neg Hx   . Rectal cancer Neg Hx   . Stomach cancer Neg Hx      HOME MEDICATIONS: Allergies as of 11/15/2020   No Known Allergies      Medication List        Accurate as of November 15, 2020  1:35 PM. If you have any questions, ask your nurse or doctor.          acetaminophen 325 MG tablet Commonly known as: TYLENOL Take  1 tablet (325 mg total) by mouth every 6 (six) hours as needed for mild pain (pain score 1-3 or temp > 100.5).   aspirin 81 MG chewable tablet Commonly known as: Aspirin Childrens Chew 1 tablet (81 mg total) by mouth daily.   atorvastatin 80 MG tablet Commonly known as: LIPITOR TAKE 1 TABLET BY MOUTH EVERY DAY What changed: when to take this   brimonidine-timolol 0.2-0.5 % ophthalmic solution Commonly known as: COMBIGAN Place 1 drop into the right eye every 12 (twelve) hours.   cholecalciferol 25 MCG (1000 UNIT) tablet Commonly known as: VITAMIN D3 Take 2,000 Units by mouth daily.   Dexcom G6 Sensor Misc Inject 1 Device into the skin as directed. Place a new sensor every 10 days to check blood sugar at least 4 times daily   Dexcom G6 Transmitter Misc Inject 1 Device into the skin as directed. Use to check blood sugar at least 4 times daily   dorzolamide 2 % ophthalmic solution Commonly known as: TRUSOPT Place 1 drop into both eyes 2 (two) times daily.   esomeprazole 20 MG capsule Commonly known as: NEXIUM Take 20 mg by mouth daily.   ezetimibe 10 MG tablet Commonly known as: Zetia Take 1 tablet (10 mg total) by mouth daily.   fluticasone 50 MCG/ACT nasal spray Commonly known as: FLONASE SPRAY 2 SPRAYS INTO EACH NOSTRIL EVERY DAY   gabapentin 300 MG capsule Commonly known as: NEURONTIN Take 300 mg by mouth at bedtime.   HumaLOG KwikPen 100 UNIT/ML KwikPen Generic drug: insulin lispro INJECT 6 UNITS INTO THE SKIN 3 (THREE) TIMES DAILY. INJECT UP TO 15 UNITS 3 TIMES WITH MEALS   Insulin Pen Needle 32G X 4 MM Misc Inject 5 pens into the skin daily. Use to inject Lantus and Novolog up to 5 times daily   lactulose 10 GM/15ML solution Commonly known as: CHRONULAC TAKE 30 MLS (20 G TOTAL) BY MOUTH AS NEEDED.   latanoprost 0.005 % ophthalmic solution Commonly known as: XALATAN Place 1 drop into both eyes in the morning and at bedtime.  metoCLOPramide 10 MG  tablet Commonly known as: REGLAN TAKE 1 TABLET BY MOUTH EVERYDAY AT BEDTIME   midodrine 5 MG tablet Commonly known as: PROAMATINE Take 5 mg by mouth 2 (two) times daily as needed.   NovoLIN N FlexPen 100 UNIT/ML Kiwkpen Generic drug: Insulin NPH (Human) (Isophane) Inject 10 Units into the skin daily. Take 10 units at the start of dialysis every day   PARoxetine 10 MG tablet Commonly known as: PAXIL Take 10 mg by mouth daily.   senna 8.6 MG tablet Commonly known as: SENOKOT Take 2 tablets by mouth daily as needed for constipation.   torsemide 20 MG tablet Commonly known as: DEMADEX Take 20 mg by mouth daily.   Tyler Aas FlexTouch 200 UNIT/ML FlexTouch Pen Generic drug: insulin degludec Inject 40 Units into the skin 2 (two) times daily.         OBJECTIVE:   Vital Signs: BP 110/74 (BP Location: Right Arm, Patient Position: Sitting, Cuff Size: Normal)   Pulse 77   Ht 5\' 10"  (1.778 m)   Wt 233 lb (105.7 kg)   SpO2 97%   BMI 33.43 kg/m   Wt Readings from Last 3 Encounters:  11/15/20 233 lb (105.7 kg)  09/14/20 219 lb 12.8 oz (99.7 kg)  08/27/20 231 lb 6.4 oz (105 kg)     Exam: General: Pt appears well and is in NAD  Lungs: Clear with good BS bilat with no rales, rhonchi, or wheezes  Heart: RRR   Neuro: MS is good with appropriate affect, pt is alert and Ox3      DM foot exam: 06/29/2019  Right foot exam , prosthetic on the left  The skin of the feet is intact without sores or ulcerations. The pedal pulses are 1+ on right  The sensation is decreased  to a screening 5.07, 10 gram monofilament on the right      DATA REVIEWED:  Lab Results  Component Value Date   HGBA1C 6.6 (A) 11/15/2020   HGBA1C 7.0 (H) 06/05/2020   HGBA1C 9.0 (A) 02/22/2020   Lab Results  Component Value Date   MICROALBUR 13.5 (H) 01/06/2014   LDLCALC 55 05/24/2019   CREATININE 3.90 (H) 08/30/2020   Lab Results  Component Value Date   MICRALBCREAT 15.5 01/06/2014     Lab  Results  Component Value Date   CHOL 119 06/05/2020   HDL 33.80 (L) 06/05/2020   LDLCALC 55 05/24/2019   LDLDIRECT 43.0 06/05/2020   TRIG 274.0 (H) 06/05/2020   CHOLHDL 4 06/05/2020         ASSESSMENT / PLAN / RECOMMENDATIONS:   1)Type 1 Diabetes Mellitus, optimally controlled, With ESRD on PD Hx of left BKA, retinopathy and macrovascular complications - Most recent A1c of 6.6 %. Goal A1c < 7.5 %.    -In review of his CGM download the patient has been noted to have severe hyperglycemia that start with peritoneal dialysis at night, he has been having to use Humalog as well as NPH and Tresiba with continued hyperglycemia.  -I have advised the patient to use Humalog with meals only, we will increase his NPH as below, I have strongly encouraged the patient to take it with the start of dialysis as long as his BG is above 70 mg/DL -If hyperglycemia continues with PD, they may increase NPH to 30 units -He is not keen on a correction scale today    MEDICATIONS: -Continue Tresiba 40  units twice a day  -Continue Humalog 8-10 units  with each meal  -Increase NPH to 25 units at the beginning of dialysis     EDUCATION / INSTRUCTIONS: BG monitoring instructions: Patient is instructed to check his blood sugars 4 times a day, before meals and bedtime  Call [facility] if: BG persistently < 70  I reviewed the Rule of 15 for the treatment of hypoglycemia in detail with the patient. Literature supplied.   2) Diabetic complications:  Eye: Does  have known diabetic retinopathy.  Neuro/ Feet: Does have known diabetic peripheral neuropathy .  Renal: Patient does  have known baseline CKD. He   is on an ACEI/ARB at present.       F/U in 4 months    Signed electronically by: Mack Guise, MD  [facility]  [facility] [address] [place], [place] [address] Phone: [phone] FAX: [phone]   CC: [name],  MD [address] [place] [address] Phone: [phone]  Fax: [phone]  Return to Endocrinology clinic as below: Future Appointments  Date Time Provider [place]  11/20/2020  9:30 AM Pyrtle, Lajuan Lines, MD [facility]  12/26/2020  2:00 PM [facility] PHARMACIST [facility]  01/04/2021  8:10 AM Camani Sesay, Melanie Crazier, MD [facility] None

## 2020-11-15 NOTE — Patient Instructions (Signed)
-  Continue Tresiba  40  units twice a day  -Humalog 8-10 units with each meal  - NPH 25 units with the start of each dialysis   HOW TO TREAT LOW BLOOD SUGARS (Blood sugar LESS THAN 70 MG/DL) Please follow the RULE OF 15 for the treatment of hypoglycemia treatment (when your (blood sugars are less than 70 mg/dL)   STEP 1: Take 15 grams of carbohydrates when your blood sugar is low, which includes:  3-4 GLUCOSE TABS  OR 3-4 OZ OF JUICE OR REGULAR SODA OR ONE TUBE OF GLUCOSE GEL    STEP 2: RECHECK blood sugar in 15 MINUTES STEP 3: If your blood sugar is still low at the 15 minute recheck --> then, go back to STEP 1 and treat AGAIN with another 15 grams of carbohydrates.

## 2020-11-20 ENCOUNTER — Ambulatory Visit: Payer: Managed Care, Other (non HMO) | Admitting: Internal Medicine

## 2020-11-20 NOTE — Telephone Encounter (Signed)
LVM to call me to see if we can stream Dexcom readings from phone app. Left number to call me with help for this.

## 2020-11-21 NOTE — Telephone Encounter (Signed)
Talked with wife.  She is not able to talk now.  Will call me back and I will help her to download device.  They are already connected to our practice.

## 2020-11-26 ENCOUNTER — Other Ambulatory Visit: Payer: Self-pay | Admitting: Internal Medicine

## 2020-12-03 ENCOUNTER — Encounter: Payer: Self-pay | Admitting: Podiatry

## 2020-12-03 ENCOUNTER — Ambulatory Visit (INDEPENDENT_AMBULATORY_CARE_PROVIDER_SITE_OTHER): Payer: 59 | Admitting: Podiatry

## 2020-12-03 ENCOUNTER — Other Ambulatory Visit: Payer: Self-pay

## 2020-12-03 DIAGNOSIS — B351 Tinea unguium: Secondary | ICD-10-CM | POA: Diagnosis not present

## 2020-12-03 DIAGNOSIS — N186 End stage renal disease: Secondary | ICD-10-CM

## 2020-12-03 DIAGNOSIS — E1022 Type 1 diabetes mellitus with diabetic chronic kidney disease: Secondary | ICD-10-CM | POA: Diagnosis not present

## 2020-12-03 DIAGNOSIS — M79674 Pain in right toe(s): Secondary | ICD-10-CM

## 2020-12-03 DIAGNOSIS — M2141 Flat foot [pes planus] (acquired), right foot: Secondary | ICD-10-CM | POA: Diagnosis not present

## 2020-12-03 DIAGNOSIS — Z992 Dependence on renal dialysis: Secondary | ICD-10-CM

## 2020-12-03 DIAGNOSIS — S88112A Complete traumatic amputation at level between knee and ankle, left lower leg, initial encounter: Secondary | ICD-10-CM

## 2020-12-03 DIAGNOSIS — M79675 Pain in left toe(s): Secondary | ICD-10-CM | POA: Diagnosis not present

## 2020-12-03 MED ORDER — KETOCONAZOLE 2 % EX CREA: 1.0000 "application " | TOPICAL_CREAM | Freq: Every day | CUTANEOUS | 2 refills | Status: DC

## 2020-12-03 NOTE — Patient Instructions (Signed)
Discuss with Dr Candiss Norse about nail fungus treatments (terbinafine 250mg  daily for 90 days) or fluconazole pulsed dosing (150mg  weekly for 6 months)

## 2020-12-04 NOTE — Progress Notes (Signed)
  Subjective:  Patient ID: Chase Clark, male    DOB: [birth year],  MRN: [id number]  Diabetic foot exam, interested in diabetic shoes, previous amputation left leg  55 y.o. male presents with the above complaint. History confirmed with patient.  He is here with his wife.  His A1c is well controlled.  He previously was an uncontrolled diabetic and has suffered diabetic retinopathy as well as Charcot deformity necessitating amputation after he developed infection on the left leg below the knee he has a prosthetic for this.  He has dense neuropathy and has no feeling in his feet.  Objective:  Physical Exam: warm, good capillary refill, no trophic changes or ulcerative lesions, and normal DP and PT pulses.  He has minimal sensation below the knee on the right leg.  Nails are thickened elongated yellowed and discolored.  He also has tinea pedis interdigitally in the fourth and fifth interspaces Assessment:   1. Type 1 diabetes mellitus with chronic kidney disease on chronic dialysis ([place])   2. Amputation of left lower extremity below knee ([place])   3. Pes planus of right foot   4. Pain due to onychomycosis of toenails of both feet      Plan:  Patient was evaluated and treated and all questions answered.  Patient educated on diabetes. Discussed proper diabetic foot care and discussed risks and complications of disease. Educated patient in depth on reasons to return to the office immediately should he/she discover anything concerning or new on the feet. All questions answered. Discussed proper shoes as well.   Inspected his shoe gear and he would benefit from a diabetic custom insole with accommodative insert and extra-depth shoe.  He was scheduled for fitting for this.  Discussed the etiology and treatment options for the condition in detail with the patient. Educated patient on the topical and oral treatment options for mycotic nails. Recommended debridement of the nails today. Sharp and  mechanical debridement performed of all painful and mycotic nails today. Nails debrided in length and thickness using a nail nipper to level of comfort. Discussed treatment options including appropriate shoe gear. Follow up as needed for painful nails.  Discussed the etiology and treatment options for tinea pedis.  Discussed topical and oral treatment.  Recommended topical treatment with 2% ketoconazole cream.  This was sent to the patient's pharmacy.  Also discussed appropriate foot hygiene, use of antifungal spray such as Tinactin in shoes, as well as cleaning foot surfaces such as showers and bathroom floors with bleach.   Return in about 3 months (around 03/05/2021) for at risk diabetic foot care.

## 2020-12-11 ENCOUNTER — Encounter: Payer: Self-pay | Admitting: Podiatry

## 2020-12-13 ENCOUNTER — Ambulatory Visit: Payer: 59 | Admitting: Internal Medicine

## 2020-12-22 ENCOUNTER — Other Ambulatory Visit: Payer: Self-pay | Admitting: Family Medicine

## 2020-12-22 ENCOUNTER — Other Ambulatory Visit: Payer: Self-pay | Admitting: Internal Medicine

## 2020-12-26 ENCOUNTER — Ambulatory Visit: Payer: Managed Care, Other (non HMO) | Admitting: Pharmacist

## 2020-12-26 ENCOUNTER — Telehealth: Payer: Self-pay | Admitting: Pharmacist

## 2020-12-26 DIAGNOSIS — E103593 Type 1 diabetes mellitus with proliferative diabetic retinopathy without macular edema, bilateral: Secondary | ICD-10-CM

## 2020-12-26 DIAGNOSIS — E785 Hyperlipidemia, unspecified: Secondary | ICD-10-CM

## 2020-12-26 DIAGNOSIS — I739 Peripheral vascular disease, unspecified: Secondary | ICD-10-CM

## 2020-12-26 DIAGNOSIS — N186 End stage renal disease: Secondary | ICD-10-CM

## 2020-12-26 DIAGNOSIS — I10 Essential (primary) hypertension: Secondary | ICD-10-CM

## 2020-12-26 NOTE — Telephone Encounter (Signed)
Patient's wife notes that they plan to see the prosthesis company [employer]) before the end of the year for a refitting, as he has met his deductible.   Unsure if this will require any new referral/orders from PCP. Patient is also overdue for PCP follow up. Scheduled for next available time that provider had an opening and would work for patient/provider's schedules. Scheduled for 02/15/21.   Dr. [name] - do you know if any specific referral/order is needed for this? [name] was unsure.

## 2020-12-26 NOTE — Patient Instructions (Signed)
Visit Information   Goals Addressed               This Visit's Progress     Patient Stated     Medication Monitoring (pt-stated)        Patient Goals/Self-Care Activities Over the next 90 days, patient will:  - take medications as prescribed check glucose continuously using CGM, document, and provide at future appointments check blood pressure daily, document, and provide at future appointments Communicate with specialty providers        Patient verbalizes understanding of instructions provided today and agrees to view in [place].   Plan: Telephone follow up appointment with care management team member scheduled for:  ~12 weeks  Catie Darnelle Maffucci, PharmD, [place], [place] Clinical Pharmacist [employer] at [facility] [phone]

## 2020-12-26 NOTE — Telephone Encounter (Signed)
I am unsure if a referral would be needed though I would suspect the specialist that performed the surgery or whomever he has been following with after the amputation would be able to help arrange this if needed.

## 2020-12-26 NOTE — Chronic Care Management (AMB) (Signed)
Care Management   Pharmacy Note  12/26/2020 Name: Chase Clark MRN: [id number] DOB: [birth year]  Subjective: Chase Clark is a 55 y.o. year old male who is a primary care patient of [name], [name], MD. The Care Management team was consulted for assistance with care management and care coordination needs.    Engaged with patient's wife by telephone for follow up visit in response to provider referral for pharmacy case management and/or care coordination services.   The patient was given information about Care Management services today including:  Care Management services includes personalized support from designated clinical staff supervised by the patient's primary care provider, including individualized plan of care and coordination with other care providers. 24/7 contact phone numbers for assistance for urgent and routine care needs. The patient may stop case management services at any time by phone call to the office staff.  Patient agreed to services and consent obtained.  Assessment:  Review of patient status, including review of consultants reports, laboratory and other test data, was performed as part of comprehensive evaluation and provision of chronic care management services.   SDOH (Social Determinants of Health) assessments and interventions performed:  SDOH Interventions    Flowsheet Row Most Recent Value  SDOH Interventions   Financial Strain Interventions Intervention Not Indicated        Objective:  Lab Results  Component Value Date   CREATININE 3.90 (H) 08/30/2020   CREATININE 5.50 (H) 08/11/2020   CREATININE 5.65 (H) 08/10/2020    Lab Results  Component Value Date   HGBA1C 6.6 (A) 11/15/2020       Component Value Date/Time   CHOL 119 06/05/2020 0838   TRIG 274.0 (H) 06/05/2020 0838   HDL 33.80 (L) 06/05/2020 0838   CHOLHDL 4 06/05/2020 0838   VLDL 54.8 (H) 06/05/2020 0838   LDLCALC 55 05/24/2019 0905   LDLDIRECT 43.0 06/05/2020 0838     Clinical ASCVD: Yes  The ASCVD Risk score (Arnett DK, et al., 2019) failed to calculate for the following reasons:   The patient has a prior MI or stroke diagnosis      BP Readings from Last 3 Encounters:  11/15/20 110/74  09/14/20 119/75  08/30/20 (!) 170/77    Care Plan  No Known Allergies  Medications Reviewed Today     Reviewed by De Hollingshead, RPH-CPP (Pharmacist) on 12/26/20 at 1406  Med List Status: <None>   Medication Order Taking? Sig Documenting Provider Last Dose Status Informant  acetaminophen (TYLENOL) 325 MG tablet [id number] Yes Take 1 tablet (325 mg total) by mouth every 6 (six) hours as needed for mild pain (pain score 1-3 or temp > 100.5). Grier Mitts, PA-C Taking Active   aspirin (ASPIRIN CHILDRENS) 81 MG chewable tablet [id number] Yes Chew 1 tablet (81 mg total) by mouth daily. Lajuana Matte, MD Taking Active Spouse/Significant Other  atorvastatin (LIPITOR) 80 MG tablet [id number] Yes TAKE 1 TABLET BY MOUTH EVERY DAY [name], MD Taking Active   brimonidine-timolol (COMBIGAN) 0.2-0.5 % ophthalmic solution [id number] Yes Place 1 drop into the right eye every 12 (twelve) hours.  [provider] Taking Active Spouse/Significant Other           Med Note Chanetta Marshall Feb 14, 2019  9:49 AM)    cholecalciferol (VITAMIN D3) 25 MCG (1000 UT) tablet [id number] Yes Take 2,000 Units by mouth daily. [provider] Taking Active Spouse/Significant Other  Continuous Blood Gluc Sensor (DEXCOM G6 SENSOR) MISC  [id number] Yes Inject 1 Device into the skin as directed. Place a new sensor every 10 days to check blood sugar at least 4 times daily Shamleffer, Melanie Crazier, MD Taking Active Spouse/Significant Other  Continuous Blood Gluc Transmit (DEXCOM G6 TRANSMITTER) MISC [id number] Yes Inject 1 Device into the skin as directed. Use to check blood sugar at least 4 times daily Shamleffer, Melanie Crazier, MD Taking Active  Spouse/Significant Other  dorzolamide (TRUSOPT) 2 % ophthalmic solution [id number] Yes Place 1 drop into both eyes 2 (two) times daily. [provider] Taking Active Spouse/Significant Other  esomeprazole (NEXIUM) 20 MG capsule [id number] Yes Take 20 mg by mouth daily. [provider] Taking Active Spouse/Significant Other  ezetimibe (ZETIA) 10 MG tablet [id number] Yes TAKE 1 TABLET BY MOUTH EVERY DAY [name], MD Taking Active   fluticasone [facility]) 50 MCG/ACT nasal spray [id number] Yes SPRAY 2 SPRAYS INTO EACH NOSTRIL EVERY DAY [name], MD Taking Active   gabapentin (NEURONTIN) 300 MG capsule [id number] Yes Take 300 mg by mouth at bedtime. [provider] Taking Active   insulin degludec (TRESIBA FLEXTOUCH) 200 UNIT/ML FlexTouch Pen [id number] Yes Inject 40 Units into the skin 2 (two) times daily. Shamleffer, Melanie Crazier, MD Taking Active   insulin lispro (HUMALOG KWIKPEN) 100 UNIT/ML KwikPen [id number] Yes Max Daily 50 units Shamleffer, Melanie Crazier, MD Taking Active   Insulin NPH, Human,, Isophane, (NOVOLIN N FLEXPEN) 100 UNIT/ML Mayer Masker [id number] Yes Inject 25 Units into the skin daily. Take 10 units at the start of dialysis every day Shamleffer, Melanie Crazier, MD Taking Active            Med Note ([place]   Wed Dec 26, 2020  2:00 PM) Taking 45 units daily  Insulin Pen Needle 32G X 4 MM MISC [id number] Yes Inject 1 Device into the skin 5 (five) times daily. Use to inject Lantus and Novolog up to 5 times daily Shamleffer, Melanie Crazier, MD Taking Active   ketoconazole (NIZORAL) 2 % cream [id number] No Apply 1 application topically daily.  Patient not taking: Reported on 12/26/2020   Criselda Peaches, DPM Not Taking Active   lactulose [facility]) 10 GM/15ML solution [id number] Yes TAKE 30 MLS (20 G TOTAL) BY MOUTH AS NEEDED. Jerene Bears, MD Taking Active   latanoprost (XALATAN) 0.005 % ophthalmic solution [id number] Yes Place 1 drop  into both eyes in the morning and at bedtime. [provider] Taking Active   metoCLOPramide (REGLAN) 10 MG tablet [id number] Yes Take 1 tablet by mouth at bedtime. Must KEEP upcoming appt for further refills Pyrtle, Lajuan Lines, MD Taking Active   midodrine (PROAMATINE) 5 MG tablet [id number] No Take 5 mg by mouth 2 (two) times daily as needed.  Patient not taking: Reported on 12/26/2020   [provider] Not Taking Active   PARoxetine (PAXIL) 10 MG tablet [id number] Yes Take 10 mg by mouth daily. [provider] Taking Active   senna (SENOKOT) 8.6 MG tablet [id number] Yes Take 2 tablets by mouth daily as needed for constipation. [provider] Taking Active Spouse/Significant Other  Torsemide 40 MG TABS [id number] Yes Take 20 mg by mouth once. 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 ([place]) 06/30/2019   Type 1 diabetes mellitus with stage 4 chronic kidney disease ([place]) 06/30/2019   Type 1 diabetes mellitus with diabetic polyneuropathy (HCC) 06/30/2019   Diabetes mellitus type I ([place]) 06/30/2019   Left below-knee amputee ([place]) 06/30/2019   Swelling of limb 06/07/2019   Vision changes 02/22/2019   S/P CABG x 2 01/27/2019   Nephrotic syndrome 01/19/2019   Allergic rhinitis 07/19/2018   Amputation of left lower extremity below knee ([place]) 09/09/2017   Benign prostatic hyperplasia    Hyperlipidemia    Chronic kidney disease (CKD), stage IV (severe) (HCC)     Neuropathic pain    Coronary artery disease involving native coronary artery of native heart without angina pectoris    Anemia of chronic disease    Cervical spondylosis with myelopathy and radiculopathy 05/07/2017   Fatty liver 04/04/2017   DDD (degenerative disc disease), cervical 01/13/2017   PAD (peripheral artery disease) ([place]) 08/07/2016   Risk for falls 07/28/2016   Diabetic Charcot foot ([place]) 07/21/2016   Nondisplaced fracture of left calcaneus 07/10/2016   Wound infection 07/10/2016   Acute osteomyelitis of right foot ([place]) 07/03/2016   Diabetic peripheral neuropathy ([place]) 07/03/2016   Chronic diarrhea 05/17/2016   Hyponatremia 05/17/2016   Peripheral vascular disease of extremity with claudication ([place]) 09/12/2015   Anemia 01/12/2014   Disorder of ligament of ankle 07/31/2013   Edema 06/28/2013   Impotence due to erectile dysfunction 06/12/2013   Obesity (BMI 30-39.9) 06/12/2013   Pain in joint, shoulder region 06/12/2013   Statin intolerance 06/10/2013   Hyperlipidemia associated with type 2 diabetes mellitus ([place]) 07/17/2006   GILBERT'S SYNDROME 07/17/2006   Hypertension associated with diabetes ([place]) 07/17/2006   Coronary atherosclerosis 07/17/2006   OSTEOARTHRITIS 07/17/2006    Conditions to be addressed/monitored: CAD, HTN, HLD, and DMII  Care Plan : Medication Management  Updates made by De Hollingshead, RPH-CPP since 12/26/2020 12:00 AM     Problem: Diabetes, CAD (hx CABG, HTN), CKD      Long-Range Goal: Disease Progression Prevention   This Visit's Progress: On track  Recent Progress: On track  Priority: High  Note:   Current Barriers:  Unable to achieve control of diabetes, hypertension   Pharmacist Clinical Goal(s):  Over the next 90 days, patient will achieve adherence to monitoring guidelines and medication adherence to achieve therapeutic efficacy through collaboration with PharmD and provider.    Interventions: 1:1 collaboration with  [name], MD regarding development and update of comprehensive plan of care as evidenced by provider attestation and co-signature Inter-disciplinary care team collaboration (see longitudinal plan of care) Comprehensive medication review performed; medication list updated in electronic medical record  Health Maintenance Yearly diabetic eye exam: due - followed extensively by ophthalmology, but we have not received notice of exam results. Encouraged wife to ask eye doctor to send these results to [place] or Dr. Kelton Pillar Yearly diabetic foot exam: due - placed reminder in upcoming appointment for provider Yearly influenza vaccination: due - recommended he pursue this vaccination Td/Tdap vaccination: up to date Pneumonia vaccination: up to date COVID vaccinations: due - recommended bivalent booster at local pharmacy Shingrix vaccinations: due - recommended they receive at the office. Encouraged to call to schedule nurse visit  when ready Overdue for PCP f/u. Assisted in scheduling today.  Diabetes, treated as T1: Controlled, but with elevations with dialysis; current treatment: Tresiba 40 units BID, Novolog PRN up to 5 units TID, Humulin N 45 units with PD Hx significant GI upset, occular migraines w/ Ozempic ESRD limits metformin, SGLT2 Denies episodes of hypoglycemia Using DexCom CGM. They are unsure if Dr. Quin Hoop office is receiving readings via [facility]. Will message Dr. Julio Sicks to see if that is set up. Continue current regimen along with endocrinology collaboration at this time.  Hypertension with ESRD, using PD Controlled; current treatment: midodrine 5 mg BID PRN SBP <100 - though has not required; torsemide 40 mg QAM LEE w/ amlodipine Current home BP readings: [name] reports at goal <130 since being on dialysis Continue current regimen along with collaboration with nephrology, cardiology.   Hyperlipidemia: Controlled per last lipid panel; current treatment:  atorvastatin 80 mg daily, ezetimibe 10 mg daily No dose adjustments needed given dialysis status Current antiplatelet regimen: aspirin 81 mg daily Recommend to continue current regimen at this time.   Allergies: Controlled; current using fluticasone nasal spray PRN Recommend to continue current regimen at this time.   GI (GERD hx Barrett's, gastroparesis, family hx NASH): Appropriately managed per current regimen: omeprazole 20 mg daily for GERD; senna daily for constipation, lactulose QAM No dose adjustments needed given dialysis status Recommend to continue current regimen with dietary modifications at this time  Supplements: Vitamin D 2000 units daily;    Patient Goals/Self-Care Activities Over the next 90 days, patient will:  - take medications as prescribed check glucose at least at least three times daily using CGM, document, and provide at future appointments check blood pressure daily, document, and provide at future appointments Communicate with specialty providers  Follow Up Plan: Telephone follow up appointment with care management team member scheduled for: ~ 12 weeks     Medication Assistance:  None required.  Patient affirms current coverage meets needs.  Follow Up:  Patient agrees to Care Plan and Follow-up.  Plan: Telephone follow up appointment with care management team member scheduled for:  ~12 weeks  Catie Darnelle Maffucci, PharmD, [place], [place] Clinical Pharmacist [employer] at [employer] [phone]

## 2021-01-01 NOTE — Telephone Encounter (Signed)
Patients wife called stating she has not heard back from anyone regarding her my chart message. Patients wife states Dr Merita Norton pt neuphrologist said he can have the RX for fungus half the dose. Call wife with any questions. Tele [phone]

## 2021-01-02 MED ORDER — FLUCONAZOLE 100 MG PO TABS: 100.0000 mg | ORAL_TABLET | ORAL | 0 refills | Status: AC

## 2021-01-04 ENCOUNTER — Telehealth: Payer: Self-pay | Admitting: Pharmacist

## 2021-01-04 ENCOUNTER — Ambulatory Visit: Payer: Managed Care, Other (non HMO) | Admitting: Internal Medicine

## 2021-01-04 NOTE — Telephone Encounter (Signed)
Received voicemail from patient's wife, [name], asking that I call. Called her back, left voicemail for her to return my call at her convenience.

## 2021-01-07 ENCOUNTER — Telehealth: Payer: Self-pay | Admitting: Internal Medicine

## 2021-01-07 NOTE — Telephone Encounter (Signed)
Patient's wife [name] requests to be called at ph# [phone] re: Patient's blood sugars. Terra states Dr. Kelton Pillar should be getting Patient's Dexcom readings today (01/07/21)

## 2021-01-08 ENCOUNTER — Telehealth: Payer: Self-pay

## 2021-01-08 ENCOUNTER — Telehealth: Payer: Self-pay | Admitting: Family Medicine

## 2021-01-08 NOTE — Telephone Encounter (Signed)
Patient wife wants to know if they can go any higher on Humulin N units at night time because sugar was running high around 400 night before last.

## 2021-01-08 NOTE — Telephone Encounter (Signed)
Patient spouse returned telephone call. Please call back at [phone]. Please advise

## 2021-01-08 NOTE — Telephone Encounter (Signed)
Detailed vm left for with increase information on medication

## 2021-01-08 NOTE — Telephone Encounter (Signed)
Vm left for patient wife to callback about patient blood sugar readings. Try to upload the [place] and last synced 11/2020. I need reading from last 2 weeks.

## 2021-01-09 NOTE — Telephone Encounter (Signed)
I did review glucose data with the wife Baxter Flattery  Patient is on acceptable dose of Humulin NPH 45 at the start of dialysis  He will continue to use Tresiba, Humalog and correction scale

## 2021-01-23 ENCOUNTER — Other Ambulatory Visit: Payer: Self-pay

## 2021-01-23 ENCOUNTER — Other Ambulatory Visit: Payer: Managed Care, Other (non HMO)

## 2021-01-28 ENCOUNTER — Other Ambulatory Visit: Payer: Self-pay | Admitting: Internal Medicine

## 2021-02-04 ENCOUNTER — Telehealth: Payer: Self-pay | Admitting: Podiatry

## 2021-02-04 NOTE — Telephone Encounter (Signed)
Per Christopher @ [employer] insurance orthotics and diabetic shoes and inserts(L3020/A5500/A5514) are valid and billable codes. Diagnosis driven and the E10.22 covers them. Covered @ 80% after 600/1200 deductible(Pt has met both) out of pocket is 3400/7800(met both) since pt has met out of pocket covered @ 100%. No auth is required.. ref # [id number].. 

## 2021-02-07 ENCOUNTER — Ambulatory Visit (INDEPENDENT_AMBULATORY_CARE_PROVIDER_SITE_OTHER): Payer: Managed Care, Other (non HMO) | Admitting: Podiatry

## 2021-02-07 ENCOUNTER — Encounter: Payer: Self-pay | Admitting: Podiatry

## 2021-02-07 ENCOUNTER — Other Ambulatory Visit: Payer: Self-pay

## 2021-02-07 DIAGNOSIS — S88112A Complete traumatic amputation at level between knee and ankle, left lower leg, initial encounter: Secondary | ICD-10-CM | POA: Diagnosis not present

## 2021-02-07 DIAGNOSIS — N186 End stage renal disease: Secondary | ICD-10-CM

## 2021-02-07 DIAGNOSIS — B351 Tinea unguium: Secondary | ICD-10-CM

## 2021-02-07 DIAGNOSIS — E1022 Type 1 diabetes mellitus with diabetic chronic kidney disease: Secondary | ICD-10-CM

## 2021-02-07 DIAGNOSIS — M2141 Flat foot [pes planus] (acquired), right foot: Secondary | ICD-10-CM | POA: Diagnosis not present

## 2021-02-07 DIAGNOSIS — Z992 Dependence on renal dialysis: Secondary | ICD-10-CM

## 2021-02-07 DIAGNOSIS — M79674 Pain in right toe(s): Secondary | ICD-10-CM

## 2021-02-07 DIAGNOSIS — M79675 Pain in left toe(s): Secondary | ICD-10-CM

## 2021-02-07 NOTE — Progress Notes (Addendum)
This patient returns to my office for at risk foot care.  This patient requires this care by a professional since this patient will be at risk due to having diabetic neuropathy with amputation left leg/foot. This patient is unable to cut nails himself since the patient cannot reach his nails.These nails are painful walking and wearing shoes.  This patient presents for at risk foot care today.  General Appearance  Alert, conversant and in no acute stress.  Vascular  Dorsalis pedis and posterior tibial  pulses are not  palpable  bilaterally.  Capillary return is within normal limits  bilaterally. Temperature is within normal limits  bilaterally.  Neurologic  Senn-Weinstein monofilament wire test absent bilaterally. Muscle power within normal limits bilaterally.  Nails Thick disfigured discolored nails with subungual debris  from hallux to fifth toes bilaterally. No evidence of bacterial infection or drainage bilaterally.  Orthopedic  No limitations of motion  feet .  No crepitus or effusions noted.  No bony pathology or digital deformities noted.  Skin  normotropic skin with no porokeratosis noted bilaterally.  No signs of infections or ulcers noted.     Onychomycosis  Pain in right toes  Pain in left toes  Consent was obtained for treatment procedures.   Mechanical debridement of nails 1-5  bilaterally performed with a nail nipper.  Filed with dremel without incident.    Return office visit    3 months                 Told patient to return for periodic foot care and evaluation due to potential at risk complications.   Gardiner Barefoot DPM

## 2021-02-11 ENCOUNTER — Other Ambulatory Visit: Payer: Self-pay

## 2021-02-11 MED ORDER — METOCLOPRAMIDE HCL 10 MG PO TABS: 10.0000 mg | ORAL_TABLET | Freq: Every day | ORAL | 0 refills | Status: DC

## 2021-02-12 ENCOUNTER — Telehealth: Payer: Self-pay | Admitting: Pharmacy Technician

## 2021-02-12 ENCOUNTER — Other Ambulatory Visit (HOSPITAL_COMMUNITY): Payer: Self-pay

## 2021-02-12 NOTE — Telephone Encounter (Signed)
Patient Advocate Encounter  Received notification from [place] that prior authorization for [place] is required.   PA submitted on 11.1.22 Key  [id number] PA NOT NEED. SUBMITTED TEST CLAIM. INSURANCE WILL PAY 3 SENSORS PER 30 DAYS @ $0 CO-PAY     Luciano Cutter, CPhT Patient Radio broadcast assistant Endocrinology Phone: [phone] Fax:  [phone]

## 2021-02-15 ENCOUNTER — Telehealth: Payer: Self-pay | Admitting: Internal Medicine

## 2021-02-15 ENCOUNTER — Ambulatory Visit: Payer: Managed Care, Other (non HMO) | Admitting: Family Medicine

## 2021-02-15 DIAGNOSIS — E1059 Type 1 diabetes mellitus with other circulatory complications: Secondary | ICD-10-CM

## 2021-02-15 MED ORDER — DEXCOM G6 TRANSMITTER MISC: 1 refills | Status: DC

## 2021-02-15 MED ORDER — DEXCOM G6 SENSOR MISC: 3 refills | Status: DC

## 2021-02-15 NOTE — Telephone Encounter (Signed)
Patient's wife [name] called re: Patient's dexcom G6 Transmitter has a message that states Patient only has 24 hours left to use said Transmitter. [name] is concerned due to Patient is on dialysis. Terra requests the Following new RX's with refills be sent asap:  MEDICATION: Continuous Blood Gluc Transmit (DEXCOM G6 TRANSMITTER) MISC  AND   Continuous Blood Gluc Sensor (DEXCOM G6 SENSOR) MISC   PHARMACY:    [id number], [place] Phone:  [phone]  Fax:  [phone]     HAS THE PATIENT CONTACTED THEIR PHARMACY?  Yes-requires new Rx  IS THIS A 90 DAY SUPPLY : ?  IS PATIENT OUT OF MEDICATION: Please see above note  IF NOT; HOW MUCH IS LEFT: Please note above  LAST APPOINTMENT DATE: @8 /07/2020  NEXT APPOINTMENT DATE:@11 /18/2022  DO WE HAVE YOUR PERMISSION TO LEAVE A DETAILED MESSAGE?: Yes  OTHER COMMENTS:    **Let patient know to contact pharmacy at the end of the day to make sure medication is ready. **  ** Please notify patient to allow 48-72 hours to process**  **Encourage patient to contact the pharmacy for refills or they can request refills through [facility]**

## 2021-02-15 NOTE — Telephone Encounter (Signed)
Rx sent in to preferred pharmacy

## 2021-02-22 ENCOUNTER — Ambulatory Visit (INDEPENDENT_AMBULATORY_CARE_PROVIDER_SITE_OTHER): Payer: Managed Care, Other (non HMO) | Admitting: Family Medicine

## 2021-02-22 ENCOUNTER — Encounter: Payer: Self-pay | Admitting: Family Medicine

## 2021-02-22 ENCOUNTER — Other Ambulatory Visit: Payer: Self-pay

## 2021-02-22 DIAGNOSIS — E103593 Type 1 diabetes mellitus with proliferative diabetic retinopathy without macular edema, bilateral: Secondary | ICD-10-CM | POA: Diagnosis not present

## 2021-02-22 DIAGNOSIS — E785 Hyperlipidemia, unspecified: Secondary | ICD-10-CM | POA: Diagnosis not present

## 2021-02-22 DIAGNOSIS — E1159 Type 2 diabetes mellitus with other circulatory complications: Secondary | ICD-10-CM

## 2021-02-22 DIAGNOSIS — S88112A Complete traumatic amputation at level between knee and ankle, left lower leg, initial encounter: Secondary | ICD-10-CM

## 2021-02-22 DIAGNOSIS — I152 Hypertension secondary to endocrine disorders: Secondary | ICD-10-CM

## 2021-02-22 DIAGNOSIS — N185 Chronic kidney disease, stage 5: Secondary | ICD-10-CM

## 2021-02-22 NOTE — Assessment & Plan Note (Signed)
The patient is currently on peritoneal dialysis.  His dialysis site appears to be without signs of infection.  I did encourage him to try different kinds of adhesive material for their bandaging to see if he would tolerate those better.

## 2021-02-22 NOTE — Assessment & Plan Note (Signed)
Has been well controlled.  They will continue management through endocrinology.

## 2021-02-22 NOTE — Assessment & Plan Note (Signed)
Generally well controlled.  They will continue to monitor off of medication.

## 2021-02-22 NOTE — Assessment & Plan Note (Signed)
Status post BKA.  He has developed an abrasion over his left knee related to an ill fitting prosthesis.  The patient needs a new socket because his current socket is ill fitting because of anatomical changes.  An order will be faxed to the [facility] clinic so we can be fit for this.

## 2021-02-22 NOTE — Progress Notes (Signed)
[name], MD Phone: [phone]  Chase Clark is a 55 y.o. male who presents today for f/u.  DIABETES Disease Monitoring: Blood Sugar ranges-high with dialysis, though reports A1c has been well controlled, follows with endo Polyuria/phagia/dipsia- no      Optho- UTD Medications: Compliance- taking tresiba 40 u BID, NPH 45 u with dialysis, humalog 8-10 u with meals Hypoglycemic symptoms- only if he skips a meal  HYPERLIPIDEMIA Symptoms Chest pain on exertion:  no   Leg claudication:   no Medications: Compliance- taking lipitor and zetia Lipid Panel     Component Value Date/Time   CHOL 119 06/05/2020 0838   TRIG 274.0 (H) 06/05/2020 0838   HDL 33.80 (L) 06/05/2020 0838   CHOLHDL 4 06/05/2020 0838   VLDL 54.8 (H) 06/05/2020 0838   LDLCALC 55 05/24/2019 0905   LDLDIRECT 43.0 06/05/2020 0838   HTN: 110s-120s/80. Not on medication. Had some trouble previously with his BP dropping.  He does have midodrine on hand from his nephrologist to take if needed though they have not required any.  He is on peritoneal dialysis.  His wife notes that the adhesive that they use for the bandage around his peritoneal dialysis catheter seems to irritate the patient's skin.  Status post BKA: He is requesting a prescription for new prosthesis.  He notes the prosthesis does not fit as well as the size of his stump has reduced.  He has a scab on his left patella.   Social History   Tobacco Use  Smoking Status Former   Types: Cigarettes   Quit date: 03/14/2008   Years since quitting: 12.9  Smokeless Tobacco Never    Current Outpatient Medications on File Prior to Visit  Medication Sig Dispense Refill   acetaminophen (TYLENOL) 325 MG tablet Take 1 tablet (325 mg total) by mouth every 6 (six) hours as needed for mild pain (pain score 1-3 or temp > 100.5). 30 tablet 0   aspirin (ASPIRIN CHILDRENS) 81 MG chewable tablet Chew 1 tablet (81 mg total) by mouth daily. 30 tablet 1   atorvastatin  (LIPITOR) 80 MG tablet TAKE 1 TABLET BY MOUTH EVERY DAY 90 tablet 1   brimonidine-timolol (COMBIGAN) 0.2-0.5 % ophthalmic solution Place 1 drop into the right eye every 12 (twelve) hours.      cholecalciferol (VITAMIN D3) 25 MCG (1000 UT) tablet Take 2,000 Units by mouth daily.     Continuous Blood Gluc Sensor (DEXCOM G6 SENSOR) MISC Inject 1 Device into the skin as directed. Place a new sensor every 10 days to check blood sugar at least 4 times daily 3 each 3   Continuous Blood Gluc Transmit (DEXCOM G6 TRANSMITTER) MISC Inject 1 Device into the skin as directed. Use to check blood sugar at least 4 times daily 1 each 1   dorzolamide (TRUSOPT) 2 % ophthalmic solution Place 1 drop into both eyes 2 (two) times daily.     esomeprazole (NEXIUM) 20 MG capsule Take 20 mg by mouth daily.     ezetimibe (ZETIA) 10 MG tablet TAKE 1 TABLET BY MOUTH EVERY DAY 90 tablet 1   fluconazole (DIFLUCAN) 100 MG tablet Take 1 tablet (100 mg total) by mouth once a week for 26 doses. 26 tablet 0   fluticasone (FLONASE) 50 MCG/ACT nasal spray SPRAY 2 SPRAYS INTO EACH NOSTRIL EVERY DAY 48 mL 2   gabapentin (NEURONTIN) 300 MG capsule Take 300 mg by mouth at bedtime.     insulin degludec (TRESIBA FLEXTOUCH) 200 UNIT/ML FlexTouch  Pen Inject 40 Units into the skin 2 (two) times daily. 45 mL 3   insulin lispro (HUMALOG KWIKPEN) 100 UNIT/ML KwikPen Max Daily 50 units 45 mL 3   Insulin NPH, Human,, Isophane, (NOVOLIN N FLEXPEN) 100 UNIT/ML Kiwkpen Inject 25 Units into the skin daily. Take 10 units at the start of dialysis every day 30 mL 6   Insulin Pen Needle 32G X 4 MM MISC Inject 1 Device into the skin 5 (five) times daily. Use to inject Lantus and Novolog up to 5 times daily 500 each 3   ketoconazole (NIZORAL) 2 % cream Apply 1 application topically daily. 60 g 2   lactulose (CHRONULAC) 10 GM/15ML solution TAKE 30 MLS (20 G TOTAL) BY MOUTH AS NEEDED. 946 mL 0   latanoprost (XALATAN) 0.005 % ophthalmic solution Place 1 drop into  both eyes in the morning and at bedtime.     metoCLOPramide (REGLAN) 10 MG tablet Take 1 tablet (10 mg total) by mouth at bedtime. Please keep your upcoming appointment for further refills 30 tablet 0   midodrine (PROAMATINE) 5 MG tablet Take 5 mg by mouth 2 (two) times daily as needed.     PARoxetine (PAXIL) 10 MG tablet Take 10 mg by mouth daily.     senna (SENOKOT) 8.6 MG tablet Take 2 tablets by mouth daily as needed for constipation.     Torsemide 40 MG TABS Take 20 mg by mouth once.     No current facility-administered medications on file prior to visit.     ROS see history of present illness  Objective  Physical Exam Vitals:   02/22/21 1148  BP: 125/80  Pulse: 67  Temp: 98.6 F (37 C)  SpO2: 97%    BP Readings from Last 3 Encounters:  02/22/21 125/80  11/15/20 110/74  09/14/20 119/75   Wt Readings from Last 3 Encounters:  02/22/21 227 lb (103 kg)  11/15/20 233 lb (105.7 kg)  09/14/20 219 lb 12.8 oz (99.7 kg)    Physical Exam Constitutional:      General: He is not in acute distress.    Appearance: He is not diaphoretic.  Cardiovascular:     Rate and Rhythm: Normal rate and regular rhythm.     Heart sounds: Normal heart sounds.  Pulmonary:     Effort: Pulmonary effort is normal.     Breath sounds: Normal breath sounds.  Abdominal:    Musculoskeletal:     Comments: Scab over the left anterior knee  Skin:    General: Skin is warm and dry.  Neurological:     Mental Status: He is alert.     Assessment/Plan: Please see individual problem list.  Problem List Items Addressed This Visit     Amputation of left lower extremity below knee ([place])    Status post BKA.  He has developed an abrasion over his left knee related to an ill fitting prosthesis.  The patient needs a new socket because his current socket is ill fitting because of anatomical changes.  An order will be faxed to the [facility] so we can be fit for this.      CKD (chronic kidney  disease), stage V ([place])    The patient is currently on peritoneal dialysis.  His dialysis site appears to be without signs of infection.  I did encourage him to try different kinds of adhesive material for their bandaging to see if he would tolerate those better.      Hyperlipidemia  Continue Lipitor 80 mg once daily and Zetia 10 mg once daily.      Hypertension associated with diabetes ([place])    Generally well controlled.  They will continue to monitor off of medication.      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  They will continue management through endocrinology.       Return in about 6 months (around 08/22/2021).  This visit occurred during the SARS-CoV-2 public health emergency.  Safety protocols were in place, including screening questions prior to the visit, additional usage of staff PPE, and extensive cleaning of exam room while observing appropriate contact time as indicated for disinfecting solutions.    [name], MD [place]

## 2021-02-22 NOTE — Assessment & Plan Note (Signed)
Continue Lipitor 80 mg once daily and Zetia 10 mg once daily.

## 2021-02-22 NOTE — Patient Instructions (Signed)
Nice to see you. We will get a prescription faxed in for your new prosthesis. Somebody from dermatology will contact you to schedule an appointment.

## 2021-02-26 ENCOUNTER — Telehealth: Payer: Self-pay

## 2021-02-26 ENCOUNTER — Telehealth: Payer: Self-pay | Admitting: Family Medicine

## 2021-02-26 ENCOUNTER — Encounter: Payer: Self-pay | Admitting: Family Medicine

## 2021-02-26 ENCOUNTER — Ambulatory Visit (INDEPENDENT_AMBULATORY_CARE_PROVIDER_SITE_OTHER): Payer: Self-pay | Admitting: Family Medicine

## 2021-02-26 DIAGNOSIS — J069 Acute upper respiratory infection, unspecified: Secondary | ICD-10-CM

## 2021-02-26 MED ORDER — HYDROCODONE BIT-HOMATROP MBR 5-1.5 MG/5ML PO SOLN: 5.0000 mL | Freq: Every evening | ORAL | 0 refills | Status: DC | PRN

## 2021-02-26 NOTE — Progress Notes (Signed)
Virtual Visit via video Note  This visit type was conducted due to national recommendations for restrictions regarding the COVID-19 pandemic (e.g. social distancing).  This format is felt to be most appropriate for this patient at this time.  All issues noted in this document were discussed and addressed.  No physical exam was performed (except for noted visual exam findings with Video Visits).   I connected with Broadus John today at  8:00 AM EST by a video enabled telemedicine application or telephone and verified that I am speaking with the correct person using two identifiers. Location patient: home Location provider: work  Persons participating in the virtual visit: patient, provider Tera Mcfarren (wife)  I discussed the limitations, risks, security and privacy concerns of performing an evaluation and management service by telephone and the availability of in person appointments. I also discussed with the patient that there may be a patient responsible charge related to this service. The patient expressed understanding and agreed to proceed.   Reason for visit: f/u.  HPI: Respiratory infection: Patient reports ongoing allergy symptoms with rhinorrhea and mild cough this time of year.  He notes his symptoms worsened on 02/24/2021.  He has felt more congested in his sinuses.  He has been coughing up clear mucus.  He feels like most of it is in his throat.  He does note some postnasal drip and rhinorrhea.  No chest pain, fevers, shortness of breath, taste disturbance, or smell disturbance.  He notes no COVID, flu, or RSV exposure.  He has been using Flonase and Zyrtec as well as diabetic Tussin over-the-counter.  The patient reports a negative home COVID test yesterday.   ROS: See pertinent positives and negatives per HPI.  Past Medical History:  Diagnosis Date   Anemia    Arthritis    Asthma    as child   Barrett's esophagus 02/16/2014   short segment   Cataract    Chronic kidney  disease (CKD), stage IV (severe) (HCC)    Coronary artery disease    DDD (degenerative disc disease), cervical    Diabetes mellitus without complication (HCC)    Diabetic osteomyelitis ([place])    Diabetic peripheral neuropathy (HCC)    Diabetic ulcer of foot with muscle involvement without evidence of necrosis ([place])    DIABETIC ULCERATIONS ASSOCIATED WITH IRRITATION LATERAL ANKLE LEFT GREATER THAN RIGHT WITH MILD CELLULITIS    Diverticulosis    DKA (diabetic ketoacidoses) 08/23/2017   Edema, lower extremity    Fatty liver    Gastroparesis 2015   GERD (gastroesophageal reflux disease)    Gilbert's disease    Glaucoma    Hx of BKA, left (HCC)    Hx of CABG 01/27/2019   2 vessels; LIMA-LAD, SVG-D1   Hx of heart artery stent 11/20/2009   80% D1 stenosis - 2.5 x 18 mm Xience V Everolimus (DES) stent x 1 placed   Hyperlipidemia    Hypertension    Left below-knee amputee ([place])    Legally blind    Myocardial infarction ([place]) 11/2009   Neuropathy    Osteoarthritis of spine    Osteomyelitis of left foot (HCC)    PONV (postoperative nausea and vomiting)    PVD (peripheral vascular disease) (HCC)    Seasonal allergies    Shoulder pain    Tubular adenoma of colon    Ulcer of foot ([place]) 08.06.2014   DIABETIC ULCERATIONS ASSOCIATED WITH IRRITATION LATERAL ANKLE LEFT GREATER THAN RIGHT WITH MILD CELLULITIS  Past Surgical History:  Procedure Laterality Date   ABDOMINAL AORTOGRAM N/A 05/20/2016   Procedure: Abdominal Aortogram possible intervention;  Surgeon: Katha Cabal, MD;  Location: [facility];  Service: Cardiovascular;  Laterality: N/A;   AMPUTATION Left 09/05/2017   Procedure: AMPUTATION BELOW KNEE;  Surgeon: Tania Ade, MD;  Location: [facility];  Service: Orthopedics;  Laterality: Left;   ANTERIOR CERVICAL DECOMP/DISCECTOMY FUSION N/A 05/07/2017   Procedure: ANTERIOR CERVICAL DECOMPRESSION/DISCECTOMY Luevenia Maxin PROSTHESIS,PLATE/SCREWS CERVICAL FIVE - CERVICAL  SIX;  Surgeon: Newman Pies, MD;  Location: [facility];  Service: Neurosurgery;  Laterality: N/A;   CAPD INSERTION N/A 08/30/2020   Procedure: LAPAROSCOPIC INSERTION CONTINUOUS AMBULATORY PERITONEAL DIALYSIS  (CAPD) CATHETER;  Surgeon: Olean Ree, MD;  Location: [facility];  Service: General;  Laterality: N/A;   CATARACT EXTRACTION W/ INTRAOCULAR LENS IMPLANT     CATARACT EXTRACTION W/PHACO Left 02/26/2017   Procedure: CATARACT EXTRACTION PHACO AND INTRAOCULAR LENS PLACEMENT ([place]);  Surgeon: Eulogio Bear, MD;  Location: [facility];  Service: Ophthalmology;  Laterality: Left;  Lot # [id number] H [place]: 00:25.3 AP%: 6.2 CDE: 1.59    CHOLECYSTECTOMY N/A    CORONARY ANGIOPLASTY WITH STENT PLACEMENT  11/20/2009   80% D1 stenosis - 2.5 x 18 mm Xience V Everolimus (DES) stent x 1 placed; LOCATION: [facility]; SURGEON: Bartholome Bill, MD   CORONARY ARTERY BYPASS GRAFT N/A 01/27/2019   Procedure: OFF PUMP CORONARY ARTERY BYPASS GRAFTING (CABG) X 2 WITH ENDOSCOPIC HARVESTING OF RIGHT GREATER SAPHENOUS VEIN. LIMA TO LAD;  Surgeon: Lajuana Matte, MD;  Location: [facility];  Service: Open Heart Surgery;  Laterality: N/A;   CORONARY STENT INTERVENTION N/A 01/21/2019   Procedure: CORONARY STENT INTERVENTION;  Surgeon: Belva Crome, MD;  Location: [facility];  Service: Cardiovascular;  Laterality: N/A;   EPIBLEPHERON REPAIR WITH TEAR DUCT PROBING     EYE SURGERY Right 02/09/2014   cataract extraction   INSERTION EXPRESS TUBE SHUNT Right 09/06/2015   Procedure: INSERTION AHMED TUBE SHUNT with tutoplast allograft;  Surgeon: Eulogio Bear, MD;  Location: [facility];  Service: Ophthalmology;  Laterality: Right;   laparoscopic insertion continuous peritoneal dialysis  2022   LEFT HEART CATH AND CORONARY ANGIOGRAPHY N/A 01/20/2019   Procedure: LEFT HEART CATH AND CORONARY ANGIOGRAPHY;  Surgeon: Lorretta Harp, MD;  Location: [facility];  Service: Cardiovascular;  Laterality: N/A;   TEE WITHOUT  CARDIOVERSION N/A 01/27/2019   Procedure: TRANSESOPHAGEAL ECHOCARDIOGRAM (TEE);  Surgeon: Lajuana Matte, MD;  Location: [facility];  Service: Open Heart Surgery;  Laterality: N/A;   VASECTOMY      Family History  Problem Relation Age of Onset   Hyperlipidemia Mother    Diabetes Mother    Liver disease Mother        NASH   Other Mother        immune thrombocytopenic pupura (ITP)   Hyperlipidemia Father    Diabetes Father    Heart disease Father    Hypertension Father    Lymphoma Father    Hemochromatosis Other        Grandmother (? which side)   Polycythemia Other    Colon cancer Neg Hx    Esophageal cancer Neg Hx    Rectal cancer Neg Hx    Stomach cancer Neg Hx     SOCIAL HX: Former smoker   Current Outpatient Medications:    acetaminophen (TYLENOL) 325 MG tablet, Take 1 tablet (325 mg total) by mouth every 6 (six) hours  as needed for mild pain (pain score 1-3 or temp > 100.5)., Disp: 30 tablet, Rfl: 0   aspirin (ASPIRIN CHILDRENS) 81 MG chewable tablet, Chew 1 tablet (81 mg total) by mouth daily., Disp: 30 tablet, Rfl: 1   atorvastatin (LIPITOR) 80 MG tablet, TAKE 1 TABLET BY MOUTH EVERY DAY, Disp: 90 tablet, Rfl: 1   brimonidine-timolol (COMBIGAN) 0.2-0.5 % ophthalmic solution, Place 1 drop into the right eye every 12 (twelve) hours. , Disp: , Rfl:    cholecalciferol (VITAMIN D3) 25 MCG (1000 UT) tablet, Take 2,000 Units by mouth daily., Disp: , Rfl:    Continuous Blood Gluc Sensor (DEXCOM G6 SENSOR) MISC, Inject 1 Device into the skin as directed. Place a new sensor every 10 days to check blood sugar at least 4 times daily, Disp: 3 each, Rfl: 3   Continuous Blood Gluc Transmit (DEXCOM G6 TRANSMITTER) MISC, Inject 1 Device into the skin as directed. Use to check blood sugar at least 4 times daily, Disp: 1 each, Rfl: 1   dorzolamide (TRUSOPT) 2 % ophthalmic solution, Place 1 drop into both eyes 2 (two) times daily., Disp: , Rfl:    esomeprazole (NEXIUM) 20 MG capsule, Take  20 mg by mouth daily., Disp: , Rfl:    ezetimibe (ZETIA) 10 MG tablet, TAKE 1 TABLET BY MOUTH EVERY DAY, Disp: 90 tablet, Rfl: 1   fluconazole (DIFLUCAN) 100 MG tablet, Take 1 tablet (100 mg total) by mouth once a week for 26 doses., Disp: 26 tablet, Rfl: 0   fluticasone (FLONASE) 50 MCG/ACT nasal spray, SPRAY 2 SPRAYS INTO EACH NOSTRIL EVERY DAY, Disp: 48 mL, Rfl: 2   gabapentin (NEURONTIN) 300 MG capsule, Take 300 mg by mouth at bedtime., Disp: , Rfl:    HYDROcodone bit-homatropine (HYCODAN) 5-1.5 MG/5ML syrup, Take 5 mLs by mouth at bedtime as needed for cough., Disp: 120 mL, Rfl: 0   insulin degludec (TRESIBA FLEXTOUCH) 200 UNIT/ML FlexTouch Pen, Inject 40 Units into the skin 2 (two) times daily., Disp: 45 mL, Rfl: 3   insulin lispro (HUMALOG KWIKPEN) 100 UNIT/ML KwikPen, Max Daily 50 units, Disp: 45 mL, Rfl: 3   Insulin NPH, Human,, Isophane, (NOVOLIN N FLEXPEN) 100 UNIT/ML Kiwkpen, Inject 25 Units into the skin daily. Take 10 units at the start of dialysis every day, Disp: 30 mL, Rfl: 6   Insulin Pen Needle 32G X 4 MM MISC, Inject 1 Device into the skin 5 (five) times daily. Use to inject Lantus and Novolog up to 5 times daily, Disp: 500 each, Rfl: 3   ketoconazole (NIZORAL) 2 % cream, Apply 1 application topically daily., Disp: 60 g, Rfl: 2   lactulose (CHRONULAC) 10 GM/15ML solution, TAKE 30 MLS (20 G TOTAL) BY MOUTH AS NEEDED., Disp: 946 mL, Rfl: 0   latanoprost (XALATAN) 0.005 % ophthalmic solution, Place 1 drop into both eyes in the morning and at bedtime., Disp: , Rfl:    metoCLOPramide (REGLAN) 10 MG tablet, Take 1 tablet (10 mg total) by mouth at bedtime. Please keep your upcoming appointment for further refills, Disp: 30 tablet, Rfl: 0   midodrine (PROAMATINE) 5 MG tablet, Take 5 mg by mouth 2 (two) times daily as needed., Disp: , Rfl:    PARoxetine (PAXIL) 10 MG tablet, Take 10 mg by mouth daily., Disp: , Rfl:    senna (SENOKOT) 8.6 MG tablet, Take 2 tablets by mouth daily as needed  for constipation., Disp: , Rfl:    Torsemide 40 MG TABS, Take 20 mg by  mouth once., Disp: , Rfl:   EXAM:  VITALS per patient if applicable:  GENERAL: alert, oriented, appears well and in no acute distress  HEENT: atraumatic, conjunttiva clear, no obvious abnormalities on inspection of external nose and ears  NECK: normal movements of the head and neck  LUNGS: on inspection no signs of respiratory distress, breathing rate appears normal, no obvious gross SOB, gasping or wheezing  CV: no obvious cyanosis  MS: moves all visible extremities without noticeable abnormality  PSYCH/NEURO: pleasant and cooperative, no obvious depression or anxiety, speech and thought processing grossly intact  ASSESSMENT AND PLAN:  Discussed the following assessment and plan:  Problem List Items Addressed This Visit     Viral URI    The patient has chronic allergy symptoms though notes significant worsening of symptoms 2 days ago.  Discussed the differential would include allergies though is more likely related to a viral illness.  Discussed he tested for COVID too soon.  I offered in office testing for COVID and flu though he deferred this and opted to retest himself today at home for COVID.  He will let us know if this is positive.  He will remain at home and rest with adequate hydration until he starts to feel better.  He will continue Flonase and Zyrtec to help with his symptoms.  We will treat his cough with Hycodan.  CMA will contact the patient and advise him to monitor for excessive drowsiness with this and if he is excessively drowsy he will discontinue the medication.  If not improving in the next week he will let us know.      Relevant Medications   HYDROcodone bit-homatropine (HYCODAN) 5-1.5 MG/5ML syrup    Return if symptoms worsen or fail to improve.   I discussed the assessment and treatment plan with the patient. The patient was provided an opportunity to ask questions and all were  answered. The patient agreed with the plan and demonstrated an understanding of the instructions.   The patient was advised to call back or seek an in-person evaluation if the symptoms worsen or if the condition fails to improve as anticipated.   [name], MD

## 2021-02-26 NOTE — Assessment & Plan Note (Signed)
The patient has chronic allergy symptoms though notes significant worsening of symptoms 2 days ago.  Discussed the differential would include allergies though is more likely related to a viral illness.  Discussed he tested for COVID too soon.  I offered in office testing for COVID and flu though he deferred this and opted to retest himself today at home for COVID.  He will let us know if this is positive.  He will remain at home and rest with adequate hydration until he starts to feel better.  He will continue Flonase and Zyrtec to help with his symptoms.  We will treat his cough with Hycodan.  CMA will contact the patient and advise him to monitor for excessive drowsiness with this and if he is excessively drowsy he will discontinue the medication.  If not improving in the next week he will let us know.

## 2021-02-26 NOTE — Telephone Encounter (Signed)
I called and spoke with the patients wife and she was in the pharmacy picking up the medication and I informed her of the effects of drowsiness and if he has any other side effects to let us know.  Torianna Junio,cma

## 2021-02-26 NOTE — Telephone Encounter (Signed)
Please let the patient know that I sent in Hycodan for his cough.  He should monitor for drowsiness with this and if he gets excessively drowsy he should discontinue it.  If he notices any other side effects with it he should discontinue use of it and let us know.

## 2021-02-28 ENCOUNTER — Encounter: Payer: Self-pay | Admitting: Internal Medicine

## 2021-02-28 NOTE — Telephone Encounter (Signed)
Signed. Please fax.

## 2021-03-01 ENCOUNTER — Ambulatory Visit: Payer: 59 | Admitting: Internal Medicine

## 2021-03-04 NOTE — Telephone Encounter (Signed)
Form was faxed. Confirmation given.  [facility],cma

## 2021-03-11 ENCOUNTER — Other Ambulatory Visit: Payer: Self-pay | Admitting: Family Medicine

## 2021-03-11 ENCOUNTER — Other Ambulatory Visit: Payer: Self-pay | Admitting: Internal Medicine

## 2021-03-11 DIAGNOSIS — E103593 Type 1 diabetes mellitus with proliferative diabetic retinopathy without macular edema, bilateral: Secondary | ICD-10-CM

## 2021-03-12 ENCOUNTER — Ambulatory Visit (INDEPENDENT_AMBULATORY_CARE_PROVIDER_SITE_OTHER): Payer: Managed Care, Other (non HMO) | Admitting: Internal Medicine

## 2021-03-12 ENCOUNTER — Encounter: Payer: Self-pay | Admitting: Internal Medicine

## 2021-03-12 VITALS — BP 148/86 | HR 73 | Ht 70.0 in | Wt 235.0 lb

## 2021-03-12 DIAGNOSIS — Z86010 Personal history of colonic polyps: Secondary | ICD-10-CM

## 2021-03-12 DIAGNOSIS — K219 Gastro-esophageal reflux disease without esophagitis: Secondary | ICD-10-CM

## 2021-03-12 DIAGNOSIS — K3184 Gastroparesis: Secondary | ICD-10-CM | POA: Diagnosis not present

## 2021-03-12 DIAGNOSIS — D126 Benign neoplasm of colon, unspecified: Secondary | ICD-10-CM | POA: Diagnosis not present

## 2021-03-12 DIAGNOSIS — K5909 Other constipation: Secondary | ICD-10-CM

## 2021-03-12 MED ORDER — NA SULFATE-K SULFATE-MG SULF 17.5-3.13-1.6 GM/177ML PO SOLN: 1.0000 | ORAL | 0 refills | Status: DC

## 2021-03-12 NOTE — Patient Instructions (Signed)
We have sent the following medications to your pharmacy for you to pick up at your convenience: [place] have been scheduled for a colonoscopy. Please follow written instructions given to you at your visit today.  Please pick up your prep supplies at the pharmacy within the next 1-3 days. If you use inhalers (even only as needed), please bring them with you on the day of your procedure.   If you are age 57 or younger, your body mass index should be between 19-25. Your Body mass index is 33.72 kg/m. If this is out of the aformentioned range listed, please consider follow up with your Primary Care Provider.   ________________________________________________________  The [facility] providers would like to encourage you to use [facility] to communicate with providers for non-urgent requests or questions.  Due to long hold times on the telephone, sending your provider a message by [facility] may be a faster and more efficient way to get a response.  Please allow 48 business hours for a response.  Please remember that this is for non-urgent requests.   Thank you for choosing me and [facility].  Dr. Hilarie Fredrickson

## 2021-03-12 NOTE — Progress Notes (Signed)
   Subjective:    Patient ID: Chase Clark, male    DOB: April 29, 1965, 55 y.o.   MRN: [id number]  HPI Chase Clark is a 55 year old male with a history of multiple adenomatous colon polyps, GERD, gastroparesis, diverticulosis, family history of NASH cirrhosis, end-stage renal disease on peritoneal dialysis, CAD with prior CABG in October 2020, diabetes with neuropathy and retinopathy who is here for follow-up.  He is here alone today and was last seen in May 2022.  Since being seen here last he is feeling much better.  He started peritoneal dialysis which is occurring nightly at home.  His wife helps him with this due to his limited vision.  Blood pressure has been easier to control and he is now actually off of most of antihypertensive therapy.  He will occasionally use midodrine if pressure is too low.  From a GI perspective he is taking Nexium 20 mg daily.  Bowel movements are mostly regular though he can still become constipated for which lactulose works well.  No abdominal pain.   Review of Systems As per HPI, otherwise negative  Current Medications, Allergies, Past Medical History, Past Surgical History, Family History and Social History were reviewed in [employer] record.    Objective:   Physical Exam BP (!) 148/86   Pulse 73   Ht 5\' 10"  (1.778 m)   Wt 235 lb (106.6 kg)   BMI 33.72 kg/m  Gen: awake, alert, NAD HEENT: anicteric Abd: soft, NT/ND, +BS throughout, PD catheter in place in the left mid abdomen Ext: no c/c/e Neuro: nonfocal      Assessment & Plan:  55 year old male with a history of multiple adenomatous colon polyps, GERD, gastroparesis, diverticulosis, family history of NASH cirrhosis, end-stage renal disease on peritoneal dialysis, CAD with prior CABG in October 2020, diabetes with neuropathy and retinopathy who is here for follow-up.   History of multiple adenomatous colon polyps --over 20 adenomas, 1 with piecemeal endoscopic mucosal  resection in the cecum at colonoscopy in February 2022.  Due for surveillance at this time. --Surveillance colonoscopy recommended and scheduled in the outpatient hospital setting with 2-day bowel prep in February 2023  2.  Stage V kidney disease  --on peritoneal dialysis  3.  GERD/gastroparesis --symptoms well controlled, continue Nexium 20 mg daily.  No history of Barrett's esophagus.  4.  History of hyperplastic gastric polyp --small subcentimeter and not associated with H. pylori.  Consider repeat upper endoscopy in February 2024  5.  History of elevated liver enzymes and family history of NASH cirrhosis --not addressed today, liver enzymes should be followed routinely.  No evidence for cirrhosis at this time.  Elastography without advanced fibrosis in 2022.  30 minutes total spent today including patient facing time, coordination of care, reviewing medical history/procedures/pertinent radiology studies, and documentation of the encounter.

## 2021-03-14 ENCOUNTER — Telehealth: Payer: Self-pay | Admitting: Podiatry

## 2021-03-14 NOTE — Telephone Encounter (Signed)
Diabetic shoes/inserts in.. lvm for pt to call to schedule an appt to pick them up in [place] office.

## 2021-03-21 ENCOUNTER — Encounter: Payer: Self-pay | Admitting: Anesthesiology

## 2021-03-21 ENCOUNTER — Encounter: Payer: Self-pay | Admitting: Ophthalmology

## 2021-03-21 ENCOUNTER — Telehealth: Payer: Managed Care, Other (non HMO)

## 2021-03-21 ENCOUNTER — Telehealth: Payer: Self-pay | Admitting: Pharmacist

## 2021-03-21 DIAGNOSIS — H4052X3 Glaucoma secondary to other eye disorders, left eye, severe stage: Secondary | ICD-10-CM | POA: Insufficient documentation

## 2021-03-21 NOTE — Telephone Encounter (Signed)
  Chronic Care Management   Note  03/21/2021 Name: Chase Clark MRN: [id number] DOB: [birth year]   Attempted to contact patient for scheduled appointment for medication management support. Left HIPAA compliant message for patient to return my call at their convenience.    Goals of care previously met. Patient engaged with transplant eval team for care management support. Closing CCM case.   Catie Darnelle Maffucci, PharmD, [place], [place] Clinical Pharmacist [employer] [facility] [phone]

## 2021-03-25 ENCOUNTER — Ambulatory Visit
Admission: RE | Admit: 2021-03-25 | Payer: Managed Care, Other (non HMO) | Source: Ambulatory Visit | Admitting: Ophthalmology

## 2021-03-25 HISTORY — DX: Dependence on renal dialysis: Z99.2

## 2021-03-25 SURGERY — INSERTION, AQUEOUS SHUNT, EYE
Anesthesia: Regional | Laterality: Left

## 2021-04-01 ENCOUNTER — Other Ambulatory Visit: Payer: Self-pay | Admitting: Family Medicine

## 2021-04-01 ENCOUNTER — Other Ambulatory Visit: Payer: Self-pay

## 2021-04-01 ENCOUNTER — Other Ambulatory Visit: Payer: Self-pay | Admitting: Internal Medicine

## 2021-04-01 ENCOUNTER — Ambulatory Visit: Payer: Managed Care, Other (non HMO)

## 2021-04-01 DIAGNOSIS — E103593 Type 1 diabetes mellitus with proliferative diabetic retinopathy without macular edema, bilateral: Secondary | ICD-10-CM

## 2021-04-01 DIAGNOSIS — Z89432 Acquired absence of left foot: Secondary | ICD-10-CM | POA: Diagnosis not present

## 2021-04-01 DIAGNOSIS — N186 End stage renal disease: Secondary | ICD-10-CM

## 2021-04-01 DIAGNOSIS — M2141 Flat foot [pes planus] (acquired), right foot: Secondary | ICD-10-CM | POA: Diagnosis not present

## 2021-04-01 DIAGNOSIS — S88112A Complete traumatic amputation at level between knee and ankle, left lower leg, initial encounter: Secondary | ICD-10-CM

## 2021-04-01 DIAGNOSIS — E1022 Type 1 diabetes mellitus with diabetic chronic kidney disease: Secondary | ICD-10-CM

## 2021-04-01 NOTE — Telephone Encounter (Signed)
Gabapentin sent to pharmacy.  Please contact the patient and confirm that he has actually been taking this.  Please also confirm his dosing.  Thanks.

## 2021-04-01 NOTE — Telephone Encounter (Signed)
Patient is taking two tablets at night of gabapentin. 300mg .

## 2021-04-01 NOTE — Progress Notes (Signed)
SITUATION Reason for Visit: Fitting of Diabetic Shoes & Insoles Patient / Caregiver Report:  Patient reports comfort in shoes  OBJECTIVE DATA: Patient History / Diagnosis:    Change in Status:   None  ACTIONS PERFORMED: In-Person Delivery, patient was fit with: - 1x pair [id number] PDAC approved prefabricated Diabetic Shoes: Orthofeet sneakers - 3x pair [id number] PDAC approved CAM milled custom diabetic insoles  Shoes and insoles were verified for structural integrity and safety. Patient wore shoes and insoles in office. Skin was inspected and free of areas of concern after wearing shoes and inserts. Shoes and inserts fit properly. Patient / Caregiver provided with ferbal instruction and demonstration regarding donning, doffing, wear, care, proper fit, function, purpose, cleaning, and use of shoes and insoles ' and in all related precautions and risks and benefits regarding shoes and insoles. Patient / Caregiver was instructed to wear properly fitting socks with shoes at all times. Patient was also provided with verbal instruction regarding how to report any failures or malfunctions of shoes or inserts, and necessary follow up care. Patient / Caregiver was also instructed to contact physician regarding change in status that may affect function of shoes and inserts.   Patient / Caregiver verbalized undersatnding of instruction provided. Patient / Caregiver demonstrated independence with proper donning and doffing of shoes and inserts.  PLAN Patient to follow up as needed. Plan of care was discussed with and agreed upon by patient and/or caregiver. All questions were answered and concerns addressed.

## 2021-04-11 ENCOUNTER — Other Ambulatory Visit: Payer: Self-pay | Admitting: Podiatry

## 2021-05-01 ENCOUNTER — Other Ambulatory Visit: Payer: Self-pay | Admitting: Internal Medicine

## 2021-05-04 ENCOUNTER — Other Ambulatory Visit: Payer: Self-pay | Admitting: Internal Medicine

## 2021-05-07 ENCOUNTER — Telehealth: Payer: Self-pay | Admitting: Internal Medicine

## 2021-05-07 MED ORDER — HUMULIN N KWIKPEN 100 UNIT/ML ~~LOC~~ SUPN: 45.0000 [IU] | PEN_INJECTOR | Freq: Every day | SUBCUTANEOUS | 3 refills | Status: DC

## 2021-05-07 NOTE — Telephone Encounter (Signed)
Wife of patient called and requesting a call back concerning BG and dialysis. Please call [phone].

## 2021-05-07 NOTE — Telephone Encounter (Signed)
Patient wife states that he is taking 45 units at night after Dialysis. A new script needs to be sent with this dosing so they are able to refill Humulin. Patient is currently out of medication.

## 2021-05-07 NOTE — Telephone Encounter (Signed)
LMTCB regarding medication and how much they are using

## 2021-05-07 NOTE — Telephone Encounter (Signed)
MEDICATION: Insulin NPH, Human,, Isophane, (HUMULIN N KWIKPEN) 100 UNIT/ML Kiwkpen  PHARMACY:   [id number] - [place], [place] Phone:  [phone]  Fax:  [phone]      HAS THE PATIENT CONTACTED THEIR PHARMACY? YES    IS THIS A 90 DAY SUPPLY : YES  IS PATIENT OUT OF MEDICATION: YES  IF NOT; HOW MUCH IS LEFT:   LAST APPOINTMENT DATE: @1 /21/2023  NEXT APPOINTMENT DATE:@Visit  date not found  DO WE HAVE YOUR PERMISSION TO LEAVE A DETAILED MESSAGE?:  OTHER COMMENTS:    **Let patient know to contact pharmacy at the end of the day to make sure medication is ready. **  ** Please notify patient to allow 48-72 hours to process**  **Encourage patient to contact the pharmacy for refills or they can request refills through [facility]**

## 2021-05-08 ENCOUNTER — Other Ambulatory Visit: Payer: Self-pay | Admitting: Internal Medicine

## 2021-05-13 ENCOUNTER — Ambulatory Visit (INDEPENDENT_AMBULATORY_CARE_PROVIDER_SITE_OTHER): Payer: Managed Care, Other (non HMO) | Admitting: Podiatry

## 2021-05-13 ENCOUNTER — Encounter: Payer: Self-pay | Admitting: Podiatry

## 2021-05-13 ENCOUNTER — Other Ambulatory Visit: Payer: Self-pay

## 2021-05-13 DIAGNOSIS — N185 Chronic kidney disease, stage 5: Secondary | ICD-10-CM | POA: Diagnosis not present

## 2021-05-13 DIAGNOSIS — M79674 Pain in right toe(s): Secondary | ICD-10-CM | POA: Diagnosis not present

## 2021-05-13 DIAGNOSIS — M79675 Pain in left toe(s): Secondary | ICD-10-CM | POA: Diagnosis not present

## 2021-05-13 DIAGNOSIS — B351 Tinea unguium: Secondary | ICD-10-CM | POA: Diagnosis not present

## 2021-05-13 DIAGNOSIS — Z992 Dependence on renal dialysis: Secondary | ICD-10-CM

## 2021-05-13 DIAGNOSIS — S88112A Complete traumatic amputation at level between knee and ankle, left lower leg, initial encounter: Secondary | ICD-10-CM

## 2021-05-13 DIAGNOSIS — N186 End stage renal disease: Secondary | ICD-10-CM

## 2021-05-13 DIAGNOSIS — E1022 Type 1 diabetes mellitus with diabetic chronic kidney disease: Secondary | ICD-10-CM

## 2021-05-13 NOTE — Progress Notes (Signed)
This patient returns to my office for at risk foot care.  This patient requires this care by a professional since this patient will be at risk due to having diabetic neuropathy with amputation left leg/foot.  This patient is unable to cut nails himself since the patient cannot reach his nails.These nails are painful walking and wearing shoes.  This patient presents for at risk foot care today.  General Appearance  Alert, conversant and in no acute stress.  Vascular  Dorsalis pedis and posterior tibial  pulses are not  palpable  right .  Capillary return is within normal limits  right. Temperature is within normal limits  right  Neurologic  Senn-Weinstein monofilament wire test absent right. Muscle power within normal limits right  Nails Thick disfigured discolored nails with subungual debris  from hallux to fifth toes right . No evidence of bacterial infection or drainage bilaterally.  Orthopedic  No limitations of motion  feet .  No crepitus or effusions noted.  No bony pathology or digital deformities noted.  Skin  normotropic skin with no porokeratosis noted bilaterally.  No signs of infections or ulcers noted.     Onychomycosis  Pain in right toes    Consent was obtained for treatment procedures.   Mechanical debridement of nails 1-5  right performed with a nail nipper.  Filed with dremel without incident.    Return office visit    3 months                 Told patient to return for periodic foot care and evaluation due to potential at risk complications.   Tyreonna Czaplicki DPM   

## 2021-05-17 ENCOUNTER — Ambulatory Visit: Payer: Self-pay | Admitting: Internal Medicine

## 2021-05-22 ENCOUNTER — Encounter (HOSPITAL_COMMUNITY): Payer: Self-pay | Admitting: Internal Medicine

## 2021-05-22 NOTE — Progress Notes (Signed)
Attempted to obtain medical history via telephone, unable to reach at this time. I left a voicemail to return pre surgical testing department's phone call.  

## 2021-05-28 ENCOUNTER — Telehealth: Payer: Self-pay | Admitting: Internal Medicine

## 2021-05-28 NOTE — Telephone Encounter (Signed)
Patient's wife calling to discuss insulin for procedure. I have advised that the hospital endoscopy staff typically contact patient with insulin instructions prior to procedure. All other medications can be taken as directed. She verbalizes understanding.

## 2021-05-28 NOTE — Telephone Encounter (Signed)
Inbound call from patient wife. States she would like a call back to discuss which medication and insulin he can take for his upcoming procedure 2/16

## 2021-05-30 ENCOUNTER — Encounter (HOSPITAL_COMMUNITY): Payer: Self-pay | Admitting: Internal Medicine

## 2021-05-30 ENCOUNTER — Ambulatory Visit (HOSPITAL_BASED_OUTPATIENT_CLINIC_OR_DEPARTMENT_OTHER): Payer: Managed Care, Other (non HMO) | Admitting: Anesthesiology

## 2021-05-30 ENCOUNTER — Ambulatory Visit (HOSPITAL_COMMUNITY): Payer: Managed Care, Other (non HMO) | Admitting: Anesthesiology

## 2021-05-30 ENCOUNTER — Encounter (HOSPITAL_COMMUNITY)
Admission: RE | Disposition: A | Discharge status: Home / Self Care | Payer: Self-pay | Source: Home / Self Care | Attending: Internal Medicine

## 2021-05-30 ENCOUNTER — Ambulatory Visit (HOSPITAL_COMMUNITY)
Admission: RE | Admit: 2021-05-30 | Discharge: 2021-05-30 | Disposition: A | Discharge status: Home / Self Care | Payer: Managed Care, Other (non HMO) | Attending: Internal Medicine | Admitting: Internal Medicine

## 2021-05-30 DIAGNOSIS — E1022 Type 1 diabetes mellitus with diabetic chronic kidney disease: Secondary | ICD-10-CM | POA: Diagnosis not present

## 2021-05-30 DIAGNOSIS — Z992 Dependence on renal dialysis: Secondary | ICD-10-CM | POA: Diagnosis not present

## 2021-05-30 DIAGNOSIS — D122 Benign neoplasm of ascending colon: Secondary | ICD-10-CM | POA: Insufficient documentation

## 2021-05-30 DIAGNOSIS — I252 Old myocardial infarction: Secondary | ICD-10-CM | POA: Diagnosis not present

## 2021-05-30 DIAGNOSIS — Z89512 Acquired absence of left leg below knee: Secondary | ICD-10-CM | POA: Insufficient documentation

## 2021-05-30 DIAGNOSIS — M1990 Unspecified osteoarthritis, unspecified site: Secondary | ICD-10-CM | POA: Insufficient documentation

## 2021-05-30 DIAGNOSIS — J45909 Unspecified asthma, uncomplicated: Secondary | ICD-10-CM | POA: Diagnosis not present

## 2021-05-30 DIAGNOSIS — Z955 Presence of coronary angioplasty implant and graft: Secondary | ICD-10-CM | POA: Diagnosis not present

## 2021-05-30 DIAGNOSIS — K5730 Diverticulosis of large intestine without perforation or abscess without bleeding: Secondary | ICD-10-CM | POA: Diagnosis not present

## 2021-05-30 DIAGNOSIS — E669 Obesity, unspecified: Secondary | ICD-10-CM | POA: Insufficient documentation

## 2021-05-30 DIAGNOSIS — N184 Chronic kidney disease, stage 4 (severe): Secondary | ICD-10-CM | POA: Diagnosis not present

## 2021-05-30 DIAGNOSIS — I129 Hypertensive chronic kidney disease with stage 1 through stage 4 chronic kidney disease, or unspecified chronic kidney disease: Secondary | ICD-10-CM | POA: Insufficient documentation

## 2021-05-30 DIAGNOSIS — Z794 Long term (current) use of insulin: Secondary | ICD-10-CM | POA: Diagnosis not present

## 2021-05-30 DIAGNOSIS — Z87891 Personal history of nicotine dependence: Secondary | ICD-10-CM | POA: Diagnosis not present

## 2021-05-30 DIAGNOSIS — Z951 Presence of aortocoronary bypass graft: Secondary | ICD-10-CM | POA: Insufficient documentation

## 2021-05-30 DIAGNOSIS — K3184 Gastroparesis: Secondary | ICD-10-CM | POA: Insufficient documentation

## 2021-05-30 DIAGNOSIS — Z6832 Body mass index (BMI) 32.0-32.9, adult: Secondary | ICD-10-CM | POA: Insufficient documentation

## 2021-05-30 DIAGNOSIS — D126 Benign neoplasm of colon, unspecified: Secondary | ICD-10-CM

## 2021-05-30 DIAGNOSIS — I2510 Atherosclerotic heart disease of native coronary artery without angina pectoris: Secondary | ICD-10-CM | POA: Insufficient documentation

## 2021-05-30 DIAGNOSIS — K635 Polyp of colon: Secondary | ICD-10-CM

## 2021-05-30 DIAGNOSIS — Z86010 Personal history of colonic polyps: Secondary | ICD-10-CM | POA: Insufficient documentation

## 2021-05-30 DIAGNOSIS — E1043 Type 1 diabetes mellitus with diabetic autonomic (poly)neuropathy: Secondary | ICD-10-CM | POA: Insufficient documentation

## 2021-05-30 DIAGNOSIS — E1051 Type 1 diabetes mellitus with diabetic peripheral angiopathy without gangrene: Secondary | ICD-10-CM | POA: Diagnosis not present

## 2021-05-30 DIAGNOSIS — K219 Gastro-esophageal reflux disease without esophagitis: Secondary | ICD-10-CM | POA: Diagnosis not present

## 2021-05-30 DIAGNOSIS — Z79899 Other long term (current) drug therapy: Secondary | ICD-10-CM | POA: Insufficient documentation

## 2021-05-30 HISTORY — PX: POLYPECTOMY: SHX5525

## 2021-05-30 HISTORY — PX: COLONOSCOPY WITH PROPOFOL: SHX5780

## 2021-05-30 LAB — GLUCOSE, CAPILLARY: Glucose-Capillary: 129 mg/dL — ABNORMAL HIGH (ref 70–99)

## 2021-05-30 SURGERY — COLONOSCOPY WITH PROPOFOL
Anesthesia: Monitor Anesthesia Care

## 2021-05-30 MED ORDER — LIDOCAINE HCL (CARDIAC) PF 100 MG/5ML IV SOSY
PREFILLED_SYRINGE | INTRAVENOUS | Status: DC | PRN
  Administered 2021-05-30: 100 mg via INTRAVENOUS

## 2021-05-30 MED ORDER — SODIUM CHLORIDE 0.9 % IV SOLN: INTRAVENOUS | Status: DC

## 2021-05-30 MED ORDER — PROPOFOL 10 MG/ML IV BOLUS
INTRAVENOUS | Status: DC | PRN
  Administered 2021-05-30 (×3): 20 mg via INTRAVENOUS

## 2021-05-30 MED ORDER — PROPOFOL 500 MG/50ML IV EMUL
INTRAVENOUS | Status: AC
  Filled 2021-05-30: qty 100

## 2021-05-30 MED ORDER — PROPOFOL 500 MG/50ML IV EMUL
INTRAVENOUS | Status: DC | PRN
  Administered 2021-05-30: 100 ug/kg/min via INTRAVENOUS

## 2021-05-30 MED ORDER — PROPOFOL 10 MG/ML IV BOLUS
INTRAVENOUS | Status: AC
  Filled 2021-05-30: qty 20

## 2021-05-30 MED ORDER — LACTATED RINGERS IV SOLN
INTRAVENOUS | Status: AC | PRN
  Administered 2021-05-30: 1000 mL via INTRAVENOUS

## 2021-05-30 SURGICAL SUPPLY — 22 items

## 2021-05-30 NOTE — Discharge Instructions (Signed)

## 2021-05-30 NOTE — Op Note (Signed)
[facility] Patient Name: Chase Clark Procedure Date: 05/30/2021 MRN: [id number] Attending MD: Jerene Bears , MD Date of Birth: 1965/07/26 CSN: [id number] Age: 56 Admit Type: Outpatient Procedure:                Colonoscopy Indications:              High risk colon cancer surveillance: Personal                            history of multiple adenomas + SSPs (22 removed at                            last exam); last colonoscopy Feb 2022 Providers:                Lajuan Lines. Hilarie Fredrickson, MD, Carmie End, RN, Cherylynn Ridges, Technician, Hedy Camara Referring MD:             [name]. Sonn[place] Medicines:                Monitored Anesthesia Care Complications:            No immediate complications. Estimated Blood Loss:     Estimated blood loss was minimal. Procedure:                Pre-Anesthesia Assessment:                           - Prior to the procedure, a History and Physical                            was performed, and patient medications and                            allergies were reviewed. The patient's tolerance of                            previous anesthesia was also reviewed. The risks                            and benefits of the procedure and the sedation                            options and risks were discussed with the patient.                            All questions were answered, and informed consent                            was obtained. Prior Anticoagulants: The patient has                            taken no previous anticoagulant or antiplatelet  agents. ASA Grade Assessment: III - A patient with                            severe systemic disease. After reviewing the risks                            and benefits, the patient was deemed in                            satisfactory condition to undergo the procedure.                           After obtaining informed consent, the colonoscope                             was passed under direct vision. Throughout the                            procedure, the patient's blood pressure, pulse, and                            oxygen saturations were monitored continuously. The                            CF-HQ190L ([id number]) Olympus colonoscope was                            introduced through the anus and advanced to the                            cecum. The colonoscopy was performed without                            difficulty. The patient tolerated the procedure                            well. The quality of the bowel preparation was                            good. The ileocecal valve, appendiceal orifice, and                            rectum were photographed. Scope In: 10:43:47 AM Scope Out: 11:07:09 AM Total Procedure Duration: 0 hours 23 minutes 22 seconds  Findings:      The digital rectal exam was normal.      A small post polypectomy scar was found in the cecum. There was no       evidence of the previous polyp.      Two sessile polyps were found in the ascending colon. The polyps were 2       to 3 mm in size. These polyps were removed with a cold biopsy forceps.       Resection and retrieval were complete.      A 9 mm polyp was found in the distal ascending colon.  The polyp was       sessile. The polyp was removed with a cold snare. Resection and       retrieval were complete.      The retroflexed view of the distal rectum and anal verge was normal and       showed no anal or rectal abnormalities. Impression:               - Post-polypectomy scar in the cecum. No evidence                            of residual polyp.                           - Two 2 to 3 mm polyps in the ascending colon,                            removed with a cold biopsy forceps. Resected and                            retrieved.                           - One 9 mm polyp in the distal ascending colon,                            removed with a cold  snare. Resected and retrieved.                           - The distal rectum and anal verge are normal on                            retroflexion view. Moderate Sedation:      N/A Recommendation:           - Patient has a contact number available for                            emergencies. The signs and symptoms of potential                            delayed complications were discussed with the                            patient. Return to normal activities tomorrow.                            Written discharge instructions were provided to the                            patient.                           - Resume previous diet.                           - Continue present medications.                           -  Await pathology results.                           - Repeat colonoscopy in 3 years for surveillance. Procedure Code(s):        --- Professional ---                           [phone], Colonoscopy, flexible; with removal of                            tumor(s), polyp(s), or other lesion(s) by snare                            technique                           45380, 22, Colonoscopy, flexible; with biopsy,                            single or multiple Diagnosis Code(s):        --- Professional ---                           K63.5, Polyp of colon                           Z86.010, Personal history of colonic polyps                           Z98.890, Other specified postprocedural states CPT copyright 2019 American Medical Association. All rights reserved. The codes documented in this report are preliminary and upon coder review may  be revised to meet current compliance requirements. Jerene Bears, MD 05/30/2021 11:12:07 AM This report has been signed electronically. Number of Addenda: 0

## 2021-05-30 NOTE — Anesthesia Preprocedure Evaluation (Addendum)
Anesthesia Evaluation  Patient identified by MRN, date of birth, ID band Patient awake    Reviewed: Allergy & Precautions, NPO status , Patient's Chart, lab work & pertinent test results  History of Anesthesia Complications (+) PONV and history of anesthetic complications  Airway Mallampati: II  TM Distance: >3 FB Neck ROM: Full    Dental  (+) Teeth Intact, Dental Advisory Given, Caps,    Pulmonary asthma , former smoker,    Pulmonary exam normal breath sounds clear to auscultation       Cardiovascular hypertension, + CAD, + Past MI, + Cardiac Stents, + CABG and + Peripheral Vascular Disease  Normal cardiovascular exam Rhythm:Regular Rate:Normal     Neuro/Psych  Headaches,  Neuromuscular disease    GI/Hepatic GERD  Medicated,Gilbert's disease history of multiple adenomatous colon polyps, GERD, gastroparesis, diverticulosis   Endo/Other  diabetes, Type 1, Insulin DependentObesity   Renal/GU ESRF and DialysisRenal disease     Musculoskeletal  (+) Arthritis ,   Abdominal   Peds  Hematology negative hematology ROS (+)   Anesthesia Other Findings Day of surgery medications reviewed with the patient.  Reproductive/Obstetrics                            Anesthesia Physical Anesthesia Plan  ASA: 3  Anesthesia Plan: MAC   Post-op Pain Management:    Induction: Intravenous  PONV Risk Score and Plan: 2 and Propofol infusion and Treatment may vary due to age or medical condition  Airway Management Planned: Natural Airway and Nasal Cannula  Additional Equipment:   Intra-op Plan:   Post-operative Plan:   Informed Consent: I have reviewed the patients History and Physical, chart, labs and discussed the procedure including the risks, benefits and alternatives for the proposed anesthesia with the patient or authorized representative who has indicated his/her understanding and acceptance.      Dental advisory given  Plan Discussed with: CRNA  Anesthesia Plan Comments:         Anesthesia Quick Evaluation

## 2021-05-30 NOTE — Anesthesia Postprocedure Evaluation (Signed)
Anesthesia Post Note  Patient: Chase Clark  Procedure(s) Performed: COLONOSCOPY WITH PROPOFOL POLYPECTOMY     Patient location during evaluation: PACU Anesthesia Type: MAC Level of consciousness: awake and alert Pain management: pain level controlled Vital Signs Assessment: post-procedure vital signs reviewed and stable Respiratory status: spontaneous breathing, nonlabored ventilation, respiratory function stable and patient connected to nasal cannula oxygen Cardiovascular status: stable and blood pressure returned to baseline Postop Assessment: no apparent nausea or vomiting Anesthetic complications: no   No notable events documented.  Last Vitals:  Vitals:   05/30/21 1130 05/30/21 1140  BP: 139/81 (!) 164/85  Pulse: (!) 56 (!) 54  Resp: 13 10  Temp:    SpO2: 100% 99%    Last Pain:  Vitals:   05/30/21 1140  TempSrc:   PainSc: 0-No pain                 Santa Lighter

## 2021-05-30 NOTE — Transfer of Care (Signed)
Immediate Anesthesia Transfer of Care Note  Patient: Chase Clark  Procedure(s) Performed: COLONOSCOPY WITH PROPOFOL POLYPECTOMY  Patient Location: Endoscopy Unit  Anesthesia Type:MAC  Level of Consciousness: awake, alert , oriented and patient cooperative  Airway & Oxygen Therapy: Patient Spontanous Breathing and Patient connected to face mask oxygen  Post-op Assessment: Report given to RN and Post -op Vital signs reviewed and stable  Post vital signs: Reviewed and stable  Last Vitals:  Vitals Value Taken Time  BP 119/72 1110  Temp    Pulse 56 05/30/21 1114  Resp 13 05/30/21 1114  SpO2 100 % 05/30/21 1114  Vitals shown include unvalidated device data.  Last Pain:  Vitals:   05/30/21 1100  TempSrc: Temporal         Complications: No notable events documented.

## 2021-05-30 NOTE — H&P (Signed)
GASTROENTEROLOGY PROCEDURE H&P NOTE   Primary Care Physician: [name], MD    Reason for Procedure:  History of multiple colon polyps  Plan:    Colonoscopy   The nature of the procedure, as well as the risks, benefits, and alternatives were carefully and thoroughly reviewed with the patient. Ample time for discussion and questions allowed. The patient understood, was satisfied, and agreed to proceed.     HPI: Chase Clark is a 56 y.o. male who presents for surveillance colonoscopy.  Last exam February 2022 with 22 polyps removed.  Medical history as below.  Tolerated the prep.  No recent chest pain or shortness of breath.  No abdominal pain today.  He completed peritoneal dialysis yesterday but was instructed to remove the dialysis fluid from the abdomen this morning.  He also took antibiotics under direction from nephrology.  If polyps are removed he will hold dialysis an additional 24 hours and resume on Saturday.    Past Medical History:  Diagnosis Date   Anemia    Arthritis    Asthma    as child   Barrett's esophagus 02/16/2014   short segment   Cataract    Chronic kidney disease (CKD), stage IV (severe) (HCC)    Coronary artery disease    DDD (degenerative disc disease), cervical    Diabetes mellitus without complication (HCC)    Diabetic osteomyelitis ([place])    Diabetic peripheral neuropathy (HCC)    Diabetic ulcer of foot with muscle involvement without evidence of necrosis ([place])    DIABETIC ULCERATIONS ASSOCIATED WITH IRRITATION LATERAL ANKLE LEFT GREATER THAN RIGHT WITH MILD CELLULITIS    Diverticulosis    DKA (diabetic ketoacidoses) 08/23/2017   Edema, lower extremity    Fatty liver    Gastroparesis 2015   GERD (gastroesophageal reflux disease)    Gilbert's disease    Glaucoma    Hx of BKA, left (HCC)    Hx of CABG 01/27/2019   2 vessels; LIMA-LAD, SVG-D1   Hx of heart artery stent 11/20/2009   80% D1 stenosis - 2.5 x 18 mm Xience V  Everolimus (DES) stent x 1 placed   Hyperlipidemia    Hypertension    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   PONV (postoperative nausea and vomiting)    PVD (peripheral vascular disease) (HCC)    Seasonal allergies    Shoulder pain    Tubular adenoma of colon    Ulcer of foot ([place]) 11/17/2012   DIABETIC ULCERATIONS ASSOCIATED WITH IRRITATION LATERAL ANKLE LEFT GREATER THAN RIGHT WITH MILD CELLULITIS    Past Surgical History:  Procedure Laterality Date   ABDOMINAL AORTOGRAM N/A 05/20/2016   Procedure: Abdominal Aortogram possible intervention;  Surgeon: Katha Cabal, MD;  Location: [place] [facility];  Service: Cardiovascular;  Laterality: N/A;   AMPUTATION Left 09/05/2017   Procedure: AMPUTATION BELOW KNEE;  Surgeon: Tania Ade, MD;  Location: [place];  Service: Orthopedics;  Laterality: Left;   ANTERIOR CERVICAL DECOMP/DISCECTOMY FUSION N/A 05/07/2017   Procedure: ANTERIOR CERVICAL DECOMPRESSION/DISCECTOMY [place]ia Maxin PROSTHESIS,PLATE/SCREWS CERVICAL FIVE - CERVICAL SIX;  Surgeon: Newman Pies, MD;  Location: [place];  Service: Neurosurgery;  Laterality: N/A;   CAPD INSERTION N/A 08/30/2020   Procedure:  LAPAROSCOPIC INSERTION CONTINUOUS AMBULATORY PERITONEAL DIALYSIS  (CAPD) CATHETER;  Surgeon: Olean Ree, MD;  Location: [facility];  Service: General;  Laterality: N/A;   CATARACT EXTRACTION W/ INTRAOCULAR LENS IMPLANT     CATARACT EXTRACTION W/PHACO Left 02/26/2017   Procedure: CATARACT EXTRACTION PHACO AND INTRAOCULAR LENS PLACEMENT ([place]);  Surgeon: Eulogio Bear, MD;  Location: [facility];  Service: Ophthalmology;  Laterality: Left;  Lot # [id number] H [place]: 00:25.3 AP%: 6.2 CDE: 1.59    CHOLECYSTECTOMY N/A    CORONARY ANGIOPLASTY WITH STENT PLACEMENT  11/20/2009    80% D1 stenosis - 2.5 x 18 mm Xience V Everolimus (DES) stent x 1 placed; LOCATION: [facility]; SURGEON: Bartholome Bill, MD   CORONARY ARTERY BYPASS GRAFT N/A 01/27/2019   Procedure: OFF PUMP CORONARY ARTERY BYPASS GRAFTING (CABG) X 2 WITH ENDOSCOPIC HARVESTING OF RIGHT GREATER SAPHENOUS VEIN. LIMA TO LAD;  Surgeon: Lajuana Matte, MD;  Location: [place];  Service: Open Heart Surgery;  Laterality: N/A;   CORONARY STENT INTERVENTION N/A 01/21/2019   Procedure: CORONARY STENT INTERVENTION;  Surgeon: Belva Crome, MD;  Location: [place] [facility];  Service: Cardiovascular;  Laterality: N/A;   EPIBLEPHERON REPAIR WITH TEAR DUCT PROBING     EYE SURGERY Right 02/09/2014   cataract extraction   INSERTION EXPRESS TUBE SHUNT Right 09/06/2015   Procedure: INSERTION AHMED TUBE SHUNT with tutoplast allograft;  Surgeon: Eulogio Bear, MD;  Location: [facility];  Service: Ophthalmology;  Laterality: Right;   laparoscopic insertion continuous peritoneal dialysis  2022   LEFT HEART CATH AND CORONARY ANGIOGRAPHY N/A 01/20/2019   Procedure: LEFT HEART CATH AND CORONARY ANGIOGRAPHY;  Surgeon: Lorretta Harp, MD;  Location: [place] [facility];  Service: Cardiovascular;  Laterality: N/A;   TEE WITHOUT CARDIOVERSION N/A 01/27/2019   Procedure: TRANSESOPHAGEAL ECHOCARDIOGRAM (TEE);  Surgeon: Lajuana Matte, MD;  Location: [place];  Service: Open Heart Surgery;  Laterality: N/A;   VASECTOMY      Prior to Admission medications   Medication Sig Start Date End Date Taking? Authorizing Provider  acetaminophen (TYLENOL) 325 MG tablet Take 1 tablet (325 mg total) by mouth every 6 (six) hours as needed for mild pain (pain score 1-3 or temp > 100.5). 09/06/17  Yes Grier Mitts, PA-C  aspirin (ASPIRIN CHILDRENS) 81 MG chewable tablet Chew 1 tablet (81 mg total) by mouth daily. 03/04/19  Yes Lightfoot, Lucile Crater, MD  atorvastatin (LIPITOR) 80 MG tablet TAKE 1 TABLET BY MOUTH EVERY DAY Patient taking  differently: Take 80 mg by mouth at bedtime. 12/01/19  Yes [name], MD  brimonidine-timolol (COMBIGAN) 0.2-0.5 % ophthalmic solution Place 1 drop into both eyes every 12 (twelve) hours.   Yes [provider]  Carboxymethylcellulose Sodium (LUBRICANT EYE DROPS OP) Place 1 drop into both eyes 2 (two) times daily.   Yes [provider]  Cholecalciferol (VITAMIN D) 50 MCG (2000 UT) CAPS Take 2,000 Units by mouth daily.   Yes [provider]  Continuous Blood Gluc Sensor (DEXCOM G6 SENSOR) MISC Inject 1 Device into the skin as directed. Place a new sensor every 10 days to check blood sugar at least 4 times daily 02/15/21  Yes Shamleffer, Melanie Crazier, MD  Continuous Blood Gluc Transmit (DEXCOM G6 TRANSMITTER) MISC Inject 1 Device into the skin as directed. Use to check blood sugar at least 4 times daily 02/15/21  Yes Shamleffer, Melanie Crazier, MD  dorzolamide (TRUSOPT) 2 % ophthalmic solution Place 1 drop into both eyes 2 (two) times  daily. 10/18/19  Yes [provider]  esomeprazole (NEXIUM) 20 MG capsule Take 20 mg by mouth daily.   Yes [provider]  ezetimibe (ZETIA) 10 MG tablet TAKE 1 TABLET BY MOUTH EVERY DAY 12/24/20  Yes [name], MD  FLOMAX 0.4 MG CAPS capsule 0.4 mg at bedtime. 04/03/21  Yes [provider]  fluticasone (FLONASE) 50 MCG/ACT nasal spray SPRAY 2 SPRAYS INTO EACH NOSTRIL EVERY DAY Patient taking differently: Place 1 spray into both nostrils daily as needed for allergies. 11/14/19  Yes [name], MD  gabapentin (NEURONTIN) 300 MG capsule TAKE 1 CAPSULE BY MOUTH TWICE A DAY Patient taking differently: Take 300 mg by mouth at bedtime. 04/01/21  Yes [name], MD  gentamicin cream (GARAMYCIN) 0.1 % Apply 1 application topically every other day. Around port 04/05/21  Yes [provider]  insulin degludec (TRESIBA FLEXTOUCH) 200 UNIT/ML FlexTouch Pen Inject 40 Units into the skin 2 (two)  times daily. 11/15/20  Yes Shamleffer, Melanie Crazier, MD  insulin lispro (HUMALOG KWIKPEN) 100 UNIT/ML KwikPen Max Daily 50 units Patient taking differently: 0-10 Units daily as needed. Sliding scale 11/15/20  Yes Shamleffer, Melanie Crazier, MD  Insulin NPH, Human,, Isophane, (HUMULIN N KWIKPEN) 100 UNIT/ML Kiwkpen Inject 45 Units into the skin daily. Patient taking differently: Inject 45 Units into the skin at bedtime. 05/07/21  Yes Shamleffer, Melanie Crazier, MD  lactulose (CHRONULAC) 10 GM/15ML solution TAKE 30 MLS (20 G TOTAL) BY MOUTH AS NEEDED. 11/02/20  Yes Kayce Chismar, Lajuan Lines, MD  latanoprost (XALATAN) 0.005 % ophthalmic solution Place 1 drop into both eyes 2 (two) times daily.   Yes [provider]  metoCLOPramide (REGLAN) 10 MG tablet TAKE 1 TABLET BY MOUTH AT BEDTIME (KEEP APPOINTMENT FOR REFILLS) 05/08/21  Yes Izea Livolsi, Lajuan Lines, MD  midodrine (PROAMATINE) 5 MG tablet Take 5 mg by mouth 2 (two) times daily as needed (Below 100). 09/27/20  Yes [provider]  Multiple Vitamin (MULTIVITAMIN ADULT PO) Take 1 tablet by mouth daily.   Yes [provider]  ondansetron (ZOFRAN-ODT) 4 MG disintegrating tablet Take 4 mg by mouth every 8 (eight) hours as needed for nausea or vomiting.   Yes [provider]  PARoxetine (PAXIL) 10 MG tablet Take 10 mg by mouth daily. 08/22/20 08/22/21 Yes [provider]  prednisoLONE acetate (PRED FORTE) 1 % ophthalmic suspension Place 1 drop into the left eye 4 (four) times daily.   Yes [provider]  senna (SENOKOT) 8.6 MG tablet Take 2 tablets by mouth daily.   Yes [provider]  torsemide (DEMADEX) 20 MG tablet Take 20-40 mg by mouth See admin instructions. Take 40 mg in the morning and 20 mg in the afternoon   Yes [provider]  fluconazole (DIFLUCAN) 100 MG tablet Take 1 tablet (100 mg total) by mouth once a week for 26 doses. 01/02/21 06/27/21  McDonald, Stephan Minister, DPM  Insulin Pen Needle 32G X 4 MM  MISC Inject 1 Device into the skin 5 (five) times daily. Use to inject Lantus and Novolog up to 5 times daily 11/15/20   Shamleffer, Melanie Crazier, MD  ketoconazole (NIZORAL) 2 % cream APPLY 1 APPLICATION TOPICALLY DAILY Patient not taking: Reported on 05/28/2021 04/11/21   Criselda Peaches, DPM  Na Sulfate-K Sulfate-Mg Sulf (SUPREP BOWEL PREP KIT) 17.5-3.13-1.6 GM/177ML SOLN Take 1 kit by mouth as directed. For colonoscopy prep 03/12/21   Amahd Morino, Lajuan Lines, MD    Current Facility-Administered Medications  Medication Dose Route Frequency Provider Last Rate Last Admin   0.9 %  sodium chloride infusion   Intravenous Continuous Fonnie Crookshanks, Lajuan Lines, MD        Allergies as of 03/12/2021   (No Known Allergies)    Family History  Problem Relation Age of Onset   Hyperlipidemia Mother    Diabetes Mother    Liver disease Mother        NASH   Other Mother        immune thrombocytopenic pupura (ITP)   Hyperlipidemia Father    Diabetes Father    Heart disease Father    Hypertension Father    Lymphoma Father    Hemochromatosis Other        Grandmother (? which side)   Polycythemia Other    Colon cancer Neg Hx    Esophageal cancer Neg Hx    Rectal cancer Neg Hx    Stomach cancer Neg Hx     Social History   Socioeconomic History   Marital status: Married    Spouse name: Not on file   Number of children: Not on file   Years of education: Not on file   Highest education level: Not on file  Occupational History   Not on file  Tobacco Use   Smoking status: Former    Types: Cigarettes    Quit date: 03/14/2008    Years since quitting: 13.2   Smokeless tobacco: Never  Vaping Use   Vaping Use: Never used  Substance and Sexual Activity   Alcohol use: Yes    Comment: RARELY   Drug use: No   Sexual activity: Not on file  Other Topics Concern   Not on file  Social History Narrative   Used to work as Audiological scientist    Married    Social Determinants of Radio broadcast assistant Strain: Low  Risk    Difficulty of Paying Living Expenses: Not hard at all  Food Insecurity: Not on file  Transportation Needs: Not on file  Physical Activity: Not on file  Stress: Not on file  Social Connections: Not on file  Intimate Partner Violence: Not on file    Physical Exam: Vital signs in last 24 hours: _0  were no vitals taken for this visit. GEN: NAD EYE: Sclerae anicteric ENT: MMM CV: Non-tachycardic Pulm: CTA b/l GI: Soft, NT/ND NEURO:  Alert & Oriented x 3   Zenovia Jarred, MD [facility]  05/30/2021 10:24 AM

## 2021-05-31 ENCOUNTER — Encounter: Payer: Self-pay | Admitting: Internal Medicine

## 2021-05-31 LAB — SURGICAL PATHOLOGY

## 2021-06-07 ENCOUNTER — Ambulatory Visit (INDEPENDENT_AMBULATORY_CARE_PROVIDER_SITE_OTHER): Payer: Managed Care, Other (non HMO) | Admitting: Internal Medicine

## 2021-06-07 ENCOUNTER — Encounter: Payer: Self-pay | Admitting: Internal Medicine

## 2021-06-07 ENCOUNTER — Other Ambulatory Visit: Payer: Self-pay

## 2021-06-07 VITALS — BP 122/72 | HR 78 | Ht 70.0 in | Wt 226.0 lb

## 2021-06-07 DIAGNOSIS — E1022 Type 1 diabetes mellitus with diabetic chronic kidney disease: Secondary | ICD-10-CM

## 2021-06-07 DIAGNOSIS — E1042 Type 1 diabetes mellitus with diabetic polyneuropathy: Secondary | ICD-10-CM

## 2021-06-07 DIAGNOSIS — Z992 Dependence on renal dialysis: Secondary | ICD-10-CM

## 2021-06-07 DIAGNOSIS — R739 Hyperglycemia, unspecified: Secondary | ICD-10-CM

## 2021-06-07 DIAGNOSIS — E1059 Type 1 diabetes mellitus with other circulatory complications: Secondary | ICD-10-CM

## 2021-06-07 DIAGNOSIS — E103593 Type 1 diabetes mellitus with proliferative diabetic retinopathy without macular edema, bilateral: Secondary | ICD-10-CM

## 2021-06-07 DIAGNOSIS — N184 Chronic kidney disease, stage 4 (severe): Secondary | ICD-10-CM

## 2021-06-07 DIAGNOSIS — N186 End stage renal disease: Secondary | ICD-10-CM

## 2021-06-07 LAB — POCT GLUCOSE (DEVICE FOR HOME USE): Glucose Fasting, POC: 163 mg/dL — AB (ref 70–99)

## 2021-06-07 MED ORDER — HUMULIN N KWIKPEN 100 UNIT/ML ~~LOC~~ SUPN: 55.0000 [IU] | PEN_INJECTOR | Freq: Every day | SUBCUTANEOUS | 3 refills | Status: DC

## 2021-06-07 NOTE — Patient Instructions (Addendum)
-   Tresiba  40  units twice a day  - Humalog 8-10 units with each meal  - NPH 55  units with the start of each dialysis   HOW TO TREAT LOW BLOOD SUGARS (Blood sugar LESS THAN 70 MG/DL) Please follow the RULE OF 15 for the treatment of hypoglycemia treatment (when your (blood sugars are less than 70 mg/dL)   STEP 1: Take 15 grams of carbohydrates when your blood sugar is low, which includes:  3-4 GLUCOSE TABS  OR 3-4 OZ OF JUICE OR REGULAR SODA OR ONE TUBE OF GLUCOSE GEL    STEP 2: RECHECK blood sugar in 15 MINUTES STEP 3: If your blood sugar is still low at the 15 minute recheck --> then, go back to STEP 1 and treat AGAIN with another 15 grams of carbohydrates.

## 2021-06-07 NOTE — Progress Notes (Signed)
Name: Chase Clark  Age/ Sex: 56 y.o., male   MRN/ DOB: [id number], 1965-11-19     PCP: [name], MD   Reason for Endocrinology Evaluation: Type 1 Diabetes Mellitus  Initial Endocrine Consultative Visit: 06/29/2019    PATIENT IDENTIFIER: Chase Clark is a 56 y.o. male with a past medical history of T1DM, HTN, CAD (S/P DES 2011), gastroparesis, ESRD on PD (08/2020). The patient has followed with Endocrinology clinic since 06/29/2019 for consultative assistance with management of his diabetes.  DIABETIC HISTORY:  Chase Clark was diagnosed with  T1DM in 1990, was initially diagnosed as type 2 until years ago when he was diagnosed with T1DM, has been on insulin for ~ 15 yrs. He tried Ozempic 04/2019 with GI intolerance. His hemoglobin A1c has ranged from 5.9%  in 2021, peaking at 15.3%in 2019.    SUBJECTIVE:   During the last visit (11/16/2019): A1c 6.6 %. Adjusted MDi regimen      Today (06/07/2021): Chase Clark is here for a follow up on diabetes management.  He is accompanied by his wife Chase Clark today.He checks his glucose multiple times a day through the dexcom  Follows with Dr. Hilarie Fredrickson for colonic adenomas and elevated LFT's   Has chronic diarrhea that he attributes to PD. No nausea or diarrhea   Extronel during the day 2.5 L  At night uses two bags of 6 Liters 2.5 % dextrose ~ 300 grams of CHO   HOME DIABETES REGIMEN:  Tresiba 40 units BID Humalog  8-10 each meal - takes 8-10 units    NPH 45  units      Statin: yes ACE-I/ARB: yes    CONTINUOUS GLUCOSE MONITORING RECORD INTERPRETATION    Dates of Recording: 7/21-11/15/2020  Sensor description:dexcom   Results statistics:   CGM use % of time 95.4  Average and SD 205  Time in range  60   %  % Time Above 180 29  % Time above 250 29  % Time Below target 0   Glycemic patterns summary: Hyperglycemia starts ar 9 pm and lasts through the night but trends down in the morning   Hyperglycemic episodes   start at 9 pm and lasts all night   Hypoglycemic episodes n/a   Overnight periods: elevated        DIABETIC COMPLICATIONS: Microvascular complications:  Neuropathy, CKD IV , 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, + 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  Last Eye Exam: Completed 04/2021   Macrovascular complications:  CAD (S/P CABG 01/2019)  Denies: CVA, PVD   HISTORY:  Past Medical History:  Past Medical History:  Diagnosis Date   Anemia    Arthritis    Asthma    as child   Barrett's esophagus 02/16/2014   short segment   Cataract    Chronic kidney disease (CKD), stage IV (severe) (HCC)    Coronary artery disease    DDD (degenerative disc disease), cervical    Diabetes mellitus without complication (HCC)    Diabetic osteomyelitis ([place])    Diabetic peripheral neuropathy ([place])    Diabetic ulcer of foot with muscle involvement without evidence of necrosis ([place])    DIABETIC ULCERATIONS ASSOCIATED WITH IRRITATION LATERAL ANKLE LEFT GREATER THAN RIGHT WITH MILD CELLULITIS    Diverticulosis    DKA (diabetic ketoacidoses) 08/23/2017   Edema, lower extremity    Fatty liver    Gastroparesis 2015   GERD (gastroesophageal reflux disease)    Gilbert's disease    Glaucoma    Hx of  BKA, left ([place])    Hx of CABG 01/27/2019   2 vessels; LIMA-LAD, SVG-D1   Hx of heart artery stent 11/20/2009   80% D1 stenosis - 2.5 x 18 mm Xience V Everolimus (DES) stent x 1 placed   Hyperlipidemia    Hypertension    Left below-knee amputee ([place])    Legally blind    Myocardial infarction ([place]) 11/2009   Neuropathy    Osteoarthritis of spine    Osteomyelitis of left foot ([place])    Peritoneal dialysis catheter in place [facility])    Does dialysis over night.   PONV (postoperative nausea and vomiting)    PVD (peripheral vascular disease) (HCC)    Seasonal allergies    Shoulder pain    Tubular adenoma of colon    Ulcer of foot ([place]) 11/17/2012   DIABETIC ULCERATIONS ASSOCIATED WITH IRRITATION LATERAL ANKLE LEFT GREATER THAN RIGHT WITH  MILD CELLULITIS   Past Surgical History:  Past Surgical History:  Procedure Laterality Date   ABDOMINAL AORTOGRAM N/A 05/20/2016   Procedure: Abdominal Aortogram possible intervention;  Surgeon: Katha Cabal, MD;  Location: [place] [facility];  Service: Cardiovascular;  Laterality: N/A;   AMPUTATION Left 09/05/2017   Procedure: AMPUTATION BELOW KNEE;  Surgeon: Tania Ade, MD;  Location: [place];  Service: Orthopedics;  Laterality: Left;   ANTERIOR CERVICAL DECOMP/DISCECTOMY FUSION N/A 05/07/2017   Procedure: ANTERIOR CERVICAL DECOMPRESSION/DISCECTOMY Luevenia Maxin PROSTHESIS,PLATE/SCREWS CERVICAL FIVE - CERVICAL SIX;  Surgeon: Newman Pies, MD;  Location: [place];  Service: Neurosurgery;  Laterality: N/A;   CAPD INSERTION N/A 08/30/2020   Procedure: LAPAROSCOPIC INSERTION CONTINUOUS AMBULATORY PERITONEAL DIALYSIS  (CAPD) CATHETER;  Surgeon: Olean Ree, MD;  Location: [facility];  Service: General;  Laterality: N/A;   CATARACT EXTRACTION W/ INTRAOCULAR LENS IMPLANT     CATARACT EXTRACTION W/PHACO Left 02/26/2017   Procedure: CATARACT EXTRACTION PHACO AND INTRAOCULAR LENS PLACEMENT ([place]);  Surgeon: Eulogio Bear, MD;  Location: [facility];  Service: Ophthalmology;  Laterality: Left;  Lot # [id number] H [place]: 00:25.3 AP%: 6.2 CDE: 1.59    CHOLECYSTECTOMY N/A    COLONOSCOPY WITH PROPOFOL N/A 05/30/2021   Procedure: COLONOSCOPY WITH PROPOFOL;  Surgeon: Jerene Bears, MD;  [facility];  Service: Gastroenterology;  Laterality: N/A;   CORONARY ANGIOPLASTY WITH STENT PLACEMENT  11/20/2009   80% D1 stenosis - 2.5 x 18 mm Xience V Everolimus (DES) stent x 1 placed; LOCATION: [facility]; SURGEON: Bartholome Bill, MD   CORONARY ARTERY BYPASS GRAFT N/A 01/27/2019   Procedure: OFF PUMP CORONARY ARTERY BYPASS GRAFTING (CABG) X 2 WITH ENDOSCOPIC HARVESTING OF RIGHT GREATER SAPHENOUS VEIN. LIMA TO LAD;  Surgeon: Lajuana Matte, MD;  Location: [place];  Service: Open Heart Surgery;   Laterality: N/A;   CORONARY STENT INTERVENTION N/A 01/21/2019   Procedure: CORONARY STENT INTERVENTION;  Surgeon: Belva Crome, MD;  Location: [place] [facility];  Service: Cardiovascular;  Laterality: N/A;   EPIBLEPHERON REPAIR WITH TEAR DUCT PROBING     EYE SURGERY Right 02/09/2014   cataract extraction   INSERTION EXPRESS TUBE SHUNT Right 09/06/2015   Procedure: INSERTION AHMED TUBE SHUNT with tutoplast allograft;  Surgeon: Eulogio Bear, MD;  Location: [facility];  Service: Ophthalmology;  Laterality: Right;   laparoscopic insertion continuous peritoneal dialysis  2022   LEFT HEART CATH AND CORONARY ANGIOGRAPHY N/A 01/20/2019   Procedure: LEFT HEART CATH AND CORONARY ANGIOGRAPHY;  Surgeon: Lorretta Harp, MD;  Location: [place] [facility];  Service: Cardiovascular;  Laterality:  N/A;   POLYPECTOMY  05/30/2021   Procedure: POLYPECTOMY;  Surgeon: Jerene Bears, MD;  [facility];  Service: Gastroenterology;;   TEE WITHOUT CARDIOVERSION N/A 01/27/2019   Procedure: TRANSESOPHAGEAL ECHOCARDIOGRAM (TEE);  Surgeon: Lajuana Matte, MD;  Location: [place];  Service: Open Heart Surgery;  Laterality: N/A;   VASECTOMY     Social History:  reports that he quit smoking about 13 years ago. His smoking use included cigarettes. He has never used smokeless tobacco. He reports current alcohol use. He reports that he does not use drugs. Family History:  Family History  Problem Relation Age of Onset   Hyperlipidemia Mother    Diabetes Mother    Liver disease Mother        NASH   Other Mother        immune thrombocytopenic pupura (ITP)   Hyperlipidemia Father    Diabetes Father    Heart disease Father    Hypertension Father    Lymphoma Father    Hemochromatosis Other        Grandmother (? which side)   Polycythemia Other    Colon cancer Neg Hx    Esophageal cancer Neg Hx    Rectal cancer Neg Hx    Stomach cancer Neg Hx      HOME MEDICATIONS: Allergies as of 06/07/2021   No  Known Allergies      Medication List        Accurate as of June 07, 2021  9:12 AM. If you have any questions, ask your nurse or doctor.          acetaminophen 325 MG tablet Commonly known as: TYLENOL Take 1 tablet (325 mg total) by mouth every 6 (six) hours as needed for mild pain (pain score 1-3 or temp > 100.5).   aspirin 81 MG chewable tablet Commonly known as: Aspirin Childrens Chew 1 tablet (81 mg total) by mouth daily.   atorvastatin 80 MG tablet Commonly known as: LIPITOR TAKE 1 TABLET BY MOUTH EVERY DAY What changed: when to take this   brimonidine-timolol 0.2-0.5 % ophthalmic solution Commonly known as: COMBIGAN Place 1 drop into both eyes every 12 (twelve) hours.   Dexcom G6 Sensor Misc Inject 1 Device into the skin as directed. Place a new sensor every 10 days to check blood sugar at least 4 times daily   Dexcom G6 Transmitter Misc Inject 1 Device into the skin as directed. Use to check blood sugar at least 4 times daily   dorzolamide 2 % ophthalmic solution Commonly known as: TRUSOPT Place 1 drop into both eyes 2 (two) times daily.   esomeprazole 20 MG capsule Commonly known as: NEXIUM Take 20 mg by mouth daily.   ezetimibe 10 MG tablet Commonly known as: ZETIA TAKE 1 TABLET BY MOUTH EVERY DAY   Flomax 0.4 MG Caps capsule Generic drug: tamsulosin 0.4 mg at bedtime.   fluconazole 100 MG tablet Commonly known as: DIFLUCAN Take 1 tablet (100 mg total) by mouth once a week for 26 doses.   fluticasone 50 MCG/ACT nasal spray Commonly known as: FLONASE SPRAY 2 SPRAYS INTO EACH NOSTRIL EVERY DAY What changed: See the new instructions.   gabapentin 300 MG capsule Commonly known as: NEURONTIN TAKE 1 CAPSULE BY MOUTH TWICE A DAY What changed: when to take this   gentamicin cream 0.1 % Commonly known as: GARAMYCIN Apply 1 application topically every other day. Around port   HumuLIN N KwikPen 100 UNIT/ML Kiwkpen Generic drug: Insulin  NPH  (Human) ([place]oph[place]) Inject 45 Units into the skin daily. What changed: when to take this   insulin lispro 100 UNIT/ML KwikPen Commonly known as: HumaLOG KwikPen Max Daily 50 units What changed:  how much to take when to take this reasons to take this additional instructions   Insulin Pen Needle 32G X 4 MM Misc Inject 1 Device into the skin 5 (five) times daily. Use to inject Lantus and Novolog up to 5 times daily   ketoconazole 2 % cream Commonly known as: NIZORAL APPLY 1 APPLICATION TOPICALLY DAILY   lactulose 10 GM/15ML solution Commonly known as: CHRONULAC TAKE 30 MLS (20 G TOTAL) BY MOUTH AS NEEDED.   latanoprost 0.005 % ophthalmic solution Commonly known as: XALATAN Place 1 drop into both eyes 2 (two) times daily.   LUBRICANT EYE DROPS OP Place 1 drop into both eyes 2 (two) times daily.   metoCLOPramide 10 MG tablet Commonly known as: REGLAN TAKE 1 TABLET BY MOUTH AT BEDTIME (KEEP APPOINTMENT FOR REFILLS)   midodrine 5 MG tablet Commonly known as: PROAMATINE Take 5 mg by mouth 2 (two) times daily as needed (Below 100).   MULTIVITAMIN ADULT PO Take 1 tablet by mouth daily.   ondansetron 4 MG disintegrating tablet Commonly known as: ZOFRAN-ODT Take 4 mg by mouth every 8 (eight) hours as needed for nausea or vomiting.   PARoxetine 10 MG tablet Commonly known as: PAXIL Take 10 mg by mouth daily.   prednisoLONE acetate 1 % ophthalmic suspension Commonly known as: PRED FORTE Place 1 drop into the left eye 4 (four) times daily.   senna 8.6 MG tablet Commonly known as: SENOKOT Take 2 tablets by mouth daily.   torsemide 20 MG tablet Commonly known as: DEMADEX Take 20-40 mg by mouth See admin instructions. Take 40 mg in the morning and 20 mg in the afternoon   Tresiba FlexTouch 200 UNIT/ML FlexTouch Pen Generic drug: insulin degludec Inject 40 Units into the skin 2 (two) times daily.   Vitamin D 50 MCG (2000 UT) Caps Take 2,000 Units by mouth daily.          OBJECTIVE:   Vital Signs: BP 122/72 (BP Location: Left Arm, Patient Position: Sitting, Cuff Size: Small)    Pulse 78    Ht 5\' 10"  (1.778 m)    Wt 226 lb (102.5 kg)    SpO2 94%    BMI 32.43 kg/m   Wt Readings from Last 3 Encounters:  06/07/21 226 lb (102.5 kg)  05/30/21 225 lb 1.4 oz (102.1 kg)  03/12/21 235 lb (106.6 kg)     Exam: General: Pt appears well and is in NAD  Lungs: Clear with good BS bilat with no rales, rhonchi, or wheezes  Heart: RRR   Neuro: MS is good with appropriate affect, pt is alert and Ox3      DM foot exam: 06/29/2019  Right foot exam , prosthetic on the left  The skin of the feet is intact without sores or ulcerations. The pedal pulses are 1+ on right  The sensation is decreased  to a screening 5.07, 10 gram monofilament on the right      DATA REVIEWED:  Lab Results  Component Value Date   HGBA1C 6.6 (A) 11/15/2020   HGBA1C 7.0 (H) 06/05/2020   HGBA1C 9.0 (A) 02/22/2020   Lab Results  Component Value Date   MICROALBUR 13.5 (H) 01/06/2014   LDLCALC 55 05/24/2019   CREATININE 3.90 (H) 08/30/2020   Lab Results  Component Value Date   MICRALBCREAT 15.5 01/06/2014     Lab Results  Component Value Date   CHOL 119 06/05/2020   HDL 33.80 (L) 06/05/2020   LDLCALC 55 05/24/2019   LDLDIRECT 43.0 06/05/2020   TRIG 274.0 (H) 06/05/2020   CHOLHDL 4 06/05/2020         ASSESSMENT / PLAN / RECOMMENDATIONS:   1)Type 1 Diabetes Mellitus, optimally controlled, With ESRD on PD Hx of left BKA, retinopathy and macrovascular complications - Most recent A1c of 6.3 %. Goal A1c < 7.5 %.    -In review of his CGM download the patient has been noted with continued hyperglycemia overnight at the time of initiation of peritoneal dialysis.  Overall his average BG's has improved from 205 mg/DL to 176 mg/DL -I am going to increase his NPH as below -No changes to his basal/prandial insulin   MEDICATIONS: -Continue Tresiba 40  units twice a day   -Continue Humalog 8-10 units with each meal  -Increase NPH 55 units at the beginning of dialysis     EDUCATION / INSTRUCTIONS: BG monitoring instructions: Patient is instructed to check his blood sugars 4 times a day, before meals and bedtime  Call [facility] if: BG persistently < 70  I reviewed the Rule of 15 for the treatment of hypoglycemia in detail with the patient. Literature supplied.   2) Diabetic complications:  Eye: Does  have known diabetic retinopathy.  Neuro/ Feet: Does have known diabetic peripheral neuropathy .  Renal: Patient does  have known baseline CKD. He   is on an ACEI/ARB at present.       F/U in 6 months    Signed electronically by: Mack Guise, MD  [facility]  [facility] [address] [place], [place] [address] Phone: [phone] FAX: [phone]   CC: [name], MD [address] [place] [address] Phone: [phone]  Fax: [phone]  Return to Endocrinology clinic as below: Future Appointments  Date Time Provider [place]  08/15/2021  9:15 AM Gardiner Barefoot, DPM [facility]  08/26/2021  4:00 PM [name] [name], MD [facility]

## 2021-06-08 ENCOUNTER — Other Ambulatory Visit: Payer: Self-pay | Admitting: Internal Medicine

## 2021-06-10 ENCOUNTER — Other Ambulatory Visit: Payer: Self-pay | Admitting: Podiatry

## 2021-06-12 ENCOUNTER — Telehealth: Payer: Self-pay | Admitting: Podiatry

## 2021-06-12 NOTE — Telephone Encounter (Signed)
Patient wife called asking for a refill of Diflucan 100mg  pharmacy [facility] in [place]. Wifes number [phone] ?

## 2021-06-18 ENCOUNTER — Ambulatory Visit: Payer: Self-pay | Admitting: Pharmacist

## 2021-06-18 NOTE — Telephone Encounter (Signed)
Called patient and relayed message to pt's wife.  ?

## 2021-06-18 NOTE — Chronic Care Management (AMB) (Signed)
?  Chronic Care Management  ? ?Note ? ?06/18/2021 ?Name: Chase Clark MRN: [id number] DOB: April 02, 1966 ? ? ? ?Closing pharmacy CCM case at this time. Patient has clinic contact information for future questions or concerns.  ? ?Catie Darnelle Maffucci, PharmD, [place], CPP ?Clinical Pharmacist ?Therapist, music at [facility] ?[phone] ? ?

## 2021-07-01 ENCOUNTER — Encounter: Payer: Self-pay | Admitting: Podiatry

## 2021-07-01 ENCOUNTER — Other Ambulatory Visit: Payer: Self-pay

## 2021-07-01 ENCOUNTER — Ambulatory Visit (INDEPENDENT_AMBULATORY_CARE_PROVIDER_SITE_OTHER): Payer: Managed Care, Other (non HMO) | Admitting: Podiatry

## 2021-07-01 DIAGNOSIS — B351 Tinea unguium: Secondary | ICD-10-CM

## 2021-07-01 MED ORDER — FLUCONAZOLE 100 MG PO TABS: 100.0000 mg | ORAL_TABLET | ORAL | 0 refills | Status: DC

## 2021-07-03 NOTE — Progress Notes (Signed)
?  Subjective:  ?Patient ID: Chase Clark, male    DOB: [birth year],  MRN: [id number] ? ?Diabetic foot exam, interested in diabetic shoes, previous amputation left leg ? ?56 y.o. male presents with the above complaint. History confirmed with patient.  Returns for follow-up of ongoing nail fungus he has been taking the fluconazole as directed, no adverse effects from the medication, nails feel little bit better ? ?Objective:  ?Physical Exam: ?warm, good capillary refill, no trophic changes or ulcerative lesions, and normal DP and PT pulses.  He has minimal sensation below the knee on the right leg.  Interdigital tinea pedis has resolved there is proximal clearing in the nail plates ? ? ? ?Assessment:  ? ?1. Onychomycosis   ? ? ? ? ?Plan:  ?Patient was evaluated and treated and all questions answered. ? ?Sharp and mechanical debridement performed of all painful and mycotic nails today. Nails debrided in length and thickness using a nail nipper to level of comfort. He has had improvement with fluconazole and I prescribed him a refill of this for another 6 months.  I will see him back then photographs were taken. ? ?Return in about 6 months (around 01/01/2022) for follow up after nail fungus treatment.  ? ? ?

## 2021-07-06 ENCOUNTER — Other Ambulatory Visit: Payer: Self-pay | Admitting: Internal Medicine

## 2021-07-11 ENCOUNTER — Other Ambulatory Visit: Payer: Self-pay | Admitting: Internal Medicine

## 2021-07-11 DIAGNOSIS — E1059 Type 1 diabetes mellitus with other circulatory complications: Secondary | ICD-10-CM

## 2021-08-06 ENCOUNTER — Other Ambulatory Visit: Payer: Self-pay | Admitting: Internal Medicine

## 2021-08-07 ENCOUNTER — Other Ambulatory Visit: Payer: Self-pay | Admitting: Family Medicine

## 2021-08-13 ENCOUNTER — Telehealth: Payer: Self-pay

## 2021-08-13 ENCOUNTER — Other Ambulatory Visit: Payer: Self-pay | Admitting: Internal Medicine

## 2021-08-13 MED ORDER — DEXCOM G7 SENSOR MISC
1.0000 | 3 refills | Status: DC
Start: 2021-08-13 — End: 2021-08-22

## 2021-08-13 NOTE — Telephone Encounter (Signed)
Patient would like to have the [place] sent in. He is on the last transmitter for G6.  ?

## 2021-08-15 ENCOUNTER — Ambulatory Visit: Payer: Managed Care, Other (non HMO) | Admitting: Podiatry

## 2021-08-22 ENCOUNTER — Other Ambulatory Visit: Payer: Self-pay

## 2021-08-22 MED ORDER — DEXCOM G7 SENSOR MISC: 1.0000 | 3 refills | Status: DC

## 2021-08-23 ENCOUNTER — Telehealth: Payer: Self-pay

## 2021-08-23 NOTE — Telephone Encounter (Signed)
Patient spouse will pick up a [place] receiver on next week.  ?

## 2021-08-23 NOTE — Telephone Encounter (Signed)
Duplicate

## 2021-08-26 ENCOUNTER — Ambulatory Visit (INDEPENDENT_AMBULATORY_CARE_PROVIDER_SITE_OTHER): Payer: Managed Care, Other (non HMO) | Admitting: Family Medicine

## 2021-08-26 ENCOUNTER — Encounter: Payer: Self-pay | Admitting: Family Medicine

## 2021-08-26 VITALS — BP 110/80 | HR 76 | Temp 98.5°F | Ht 70.0 in | Wt 231.0 lb

## 2021-08-26 DIAGNOSIS — E103593 Type 1 diabetes mellitus with proliferative diabetic retinopathy without macular edema, bilateral: Secondary | ICD-10-CM | POA: Diagnosis not present

## 2021-08-26 DIAGNOSIS — M65341 Trigger finger, right ring finger: Secondary | ICD-10-CM | POA: Diagnosis not present

## 2021-08-26 DIAGNOSIS — E785 Hyperlipidemia, unspecified: Secondary | ICD-10-CM

## 2021-08-26 DIAGNOSIS — M6530 Trigger finger, unspecified finger: Secondary | ICD-10-CM | POA: Insufficient documentation

## 2021-08-26 DIAGNOSIS — N644 Mastodynia: Secondary | ICD-10-CM

## 2021-08-26 NOTE — Patient Instructions (Signed)
Nice to see you. ?Somebody will call you to schedule your mammogram. ? ?

## 2021-08-26 NOTE — Assessment & Plan Note (Addendum)
Check lipid panel.  He will continue Lipitor 80 mg daily and Zetia 10 mg daily. ?

## 2021-08-26 NOTE — Assessment & Plan Note (Signed)
Suspect mild gynecomastia.  We will check a mammogram and if it reveals enlarged breast tissue we would consider hormonal work-up. ?

## 2021-08-26 NOTE — Progress Notes (Signed)
?[name], MD ?Phone: [phone] ? ?Chase Clark is a 56 y.o. male who presents today for follow-up. ? ?Diabetes: Patient follows with endocrinology.  He is on Tresiba 40 units twice daily, Humalog sliding scale 8-10 units with meals, and NPH 55 units daily.  He notes his sugar gets high overnight due to peritoneal dialysis.  His daytime sugars are quite well controlled.  He has 1 hypoglycemic episode a week.  He has seen ophthalmology.  He does not snack between meals.  Eats well-balanced meals. ? ?Hyperlipidemia: Taking Lipitor and Zetia.  No chest pain, claudication, or right upper quadrant pain.  Occasional muscle aches in his arms when he wakes up in the morning. ? ?Breast soreness: Patient notes this has been going on 6 months.  This is around his nipples and they are sore to palpation. ? ?Trigger finger: Patient reports ongoing issues with trigger finger in his right ring finger.  He wonders about seeing somebody for this. ? ?Social History  ? ?Tobacco Use  ?Smoking Status Former  ? Types: Cigarettes  ? Quit date: 03/14/2008  ? Years since quitting: 13.4  ?Smokeless Tobacco Never  ? ? ?Current Outpatient Medications on File Prior to Visit  ?Medication Sig Dispense Refill  ? acetaminophen (TYLENOL) 325 MG tablet Take 1 tablet (325 mg total) by mouth every 6 (six) hours as needed for mild pain (pain score 1-3 or temp > 100.5). 30 tablet 0  ? aspirin (ASPIRIN CHILDRENS) 81 MG chewable tablet Chew 1 tablet (81 mg total) by mouth daily. 30 tablet 1  ? atorvastatin (LIPITOR) 80 MG tablet TAKE 1 TABLET BY MOUTH EVERY DAY (Patient taking differently: Take 80 mg by mouth at bedtime.) 90 tablet 1  ? brimonidine-timolol (COMBIGAN) 0.2-0.5 % ophthalmic solution Place 1 drop into both eyes every 12 (twelve) hours.    ? Carboxymethylcellulose Sodium (LUBRICANT EYE DROPS OP) Place 1 drop into both eyes 2 (two) times daily.    ? Cholecalciferol (VITAMIN D) 50 MCG (2000 UT) CAPS Take 2,000 Units by mouth daily.     ? Continuous Blood Gluc Sensor (DEXCOM G7 SENSOR) MISC 1 Device by Does not apply route as directed. 9 each 3  ? Continuous Blood Gluc Transmit (DEXCOM G6 TRANSMITTER) MISC Inject 1 Device into the skin as directed. Use to check blood sugar at least 4 times daily 1 each 1  ? dorzolamide (TRUSOPT) 2 % ophthalmic solution Place 1 drop into both eyes 2 (two) times daily.    ? esomeprazole (NEXIUM) 20 MG capsule Take 20 mg by mouth daily.    ? ezetimibe (ZETIA) 10 MG tablet TAKE 1 TABLET BY MOUTH EVERY DAY 90 tablet 1  ? FLOMAX 0.4 MG CAPS capsule 0.4 mg at bedtime.    ? fluconazole (DIFLUCAN) 100 MG tablet Take 1 tablet (100 mg total) by mouth once a week for 26 doses. 26 tablet 0  ? fluticasone (FLONASE) 50 MCG/ACT nasal spray SPRAY 2 SPRAYS INTO EACH NOSTRIL EVERY DAY (Patient taking differently: Place 1 spray into both nostrils daily as needed for allergies.) 48 mL 2  ? gabapentin (NEURONTIN) 300 MG capsule TAKE 1 CAPSULE BY MOUTH TWICE A DAY (Patient taking differently: Take 300 mg by mouth at bedtime.) 60 capsule 6  ? gentamicin cream (GARAMYCIN) 0.1 % Apply 1 application topically every other day. Around port    ? insulin degludec (TRESIBA FLEXTOUCH) 200 UNIT/ML FlexTouch Pen Inject 40 Units into the skin 2 (two) times daily. 45 mL 3  ?  insulin lispro (HUMALOG KWIKPEN) 100 UNIT/ML KwikPen Max Daily 50 units (Patient taking differently: 0-10 Units daily as needed. Sliding scale) 45 mL 3  ? Insulin NPH, Human,, Isophane, (HUMULIN N KWIKPEN) 100 UNIT/ML Kiwkpen Inject 55 Units into the skin daily. 60 mL 3  ? Insulin Pen Needle 32G X 4 MM MISC Inject 1 Device into the skin 5 (five) times daily. Use to inject Lantus and Novolog up to 5 times daily 500 each 3  ? ketoconazole (NIZORAL) 2 % cream APPLY 1 APPLICATION TOPICALLY DAILY 60 g 2  ? lactulose (CHRONULAC) 10 GM/15ML solution TAKE 30 MLS (20 G TOTAL) BY MOUTH AS NEEDED. 946 mL 0  ? latanoprost (XALATAN) 0.005 % ophthalmic solution Place 1 drop into both eyes 2  (two) times daily.    ? metoCLOPramide (REGLAN) 10 MG tablet TAKE 1 TABLET BY MOUTH EVERYDAY AT BEDTIME 30 tablet 2  ? midodrine (PROAMATINE) 5 MG tablet Take 5 mg by mouth 2 (two) times daily as needed (Below 100).    ? Multiple Vitamin (MULTIVITAMIN ADULT PO) Take 1 tablet by mouth daily.    ? ondansetron (ZOFRAN-ODT) 4 MG disintegrating tablet Take 4 mg by mouth every 8 (eight) hours as needed for nausea or vomiting.    ? prednisoLONE acetate (PRED FORTE) 1 % ophthalmic suspension Place 1 drop into the left eye 4 (four) times daily.    ? senna (SENOKOT) 8.6 MG tablet Take 2 tablets by mouth daily.    ? torsemide (DEMADEX) 20 MG tablet Take 20-40 mg by mouth See admin instructions. Take 40 mg in the morning and 20 mg in the afternoon    ? PARoxetine (PAXIL) 10 MG tablet Take 10 mg by mouth daily.    ? ?No current facility-administered medications on file prior to visit.  ? ? ? ?ROS see history of present illness ? ?Objective ? ?Physical Exam ?Vitals:  ? 08/26/21 1607  ?BP: 110/80  ?Pulse: 76  ?Temp: 98.5 ?F (36.9 ?C)  ?SpO2: 97%  ? ? ?BP Readings from Last 3 Encounters:  ?08/26/21 110/80  ?06/07/21 122/72  ?05/30/21 (!) 164/85  ? ?Wt Readings from Last 3 Encounters:  ?08/26/21 231 lb (104.8 kg)  ?06/07/21 226 lb (102.5 kg)  ?05/30/21 225 lb 1.4 oz (102.1 kg)  ? ? ?Physical Exam ?Constitutional:   ?   General: He is not in acute distress. ?   Appearance: He is not diaphoretic.  ?Cardiovascular:  ?   Rate and Rhythm: Normal rate and regular rhythm.  ?   Heart sounds: Normal heart sounds.  ?Pulmonary:  ?   Effort: Pulmonary effort is normal.  ?   Breath sounds: Normal breath sounds.  ?Chest:  ?   Comments: Slight tenderness around both of his nipples, slight palpable enlargement of underlying breast tissue ?Musculoskeletal:  ?   Comments: Right ring finger with apparent trigger finger, sticks on flexion and then clicks on extension  ?Skin: ?   General: Skin is warm and dry.  ?Neurological:  ?   Mental Status: He is  alert.  ? ? ? ?Assessment/Plan: Please see individual problem list. ? ?Problem List Items Addressed This Visit   ? ? Hyperlipidemia (Chronic)  ?  Check lipid panel.  He will continue Lipitor 80 mg daily and Zetia 10 mg daily. ? ?  ?  ? Trigger finger (Chronic)  ?  Refer to orthopedics. ? ?  ?  ? Relevant Orders  ? Ambulatory referral to Orthopedic Surgery  ? 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  Seems to be well controlled based on his recent A1c.  He will continue Tresiba 40 units twice daily, NPH 55 units daily, and Humalog 8 to 10 units with meals.  He will continue to see endocrinology.  We will request ophthalmology records.  He will continue healthy diet. ? ?  ?  ? Relevant Orders  ? Lipid panel  ? Soreness breast  ?  Suspect mild gynecomastia.  We will check a mammogram and if it reveals enlarged breast tissue we would consider hormonal work-up. ? ?  ?  ? Relevant Orders  ? MM DIAG BREAST TOMO BILATERAL  ? US BREAST LTD UNI RIGHT INC AXILLA  ? US BREAST LTD UNI LEFT INC AXILLA  ? ?The patient was advised to contact us if he has not heard about his mammogram or his orthopedic referral within 1 to 2 weeks. ? ?Return in about 6 months (around 02/26/2022). ? ? ?[name], MD ?[place] ? ?

## 2021-08-26 NOTE — Assessment & Plan Note (Signed)
Refer to orthopedics 

## 2021-08-26 NOTE — Assessment & Plan Note (Signed)
Seems to be well controlled based on his recent A1c.  He will continue Tresiba 40 units twice daily, NPH 55 units daily, and Humalog 8 to 10 units with meals.  He will continue to see endocrinology.  We will request ophthalmology records.  He will continue healthy diet. ?

## 2021-08-27 LAB — LDL CHOLESTEROL, DIRECT: Direct LDL: 46 mg/dL

## 2021-08-27 LAB — LIPID PANEL
Cholesterol: 105 mg/dL (ref 0–200)
HDL: 30.8 mg/dL — ABNORMAL LOW (ref >39.00)
NonHDL: 74.48
Total CHOL/HDL Ratio: 3
Triglycerides: 233 mg/dL — ABNORMAL HIGH (ref 0.0–149.0)
VLDL: 46.6 mg/dL — ABNORMAL HIGH (ref 0.0–40.0)

## 2021-08-27 NOTE — Telephone Encounter (Signed)
Plato receiver picked up by spouse ?

## 2021-09-05 ENCOUNTER — Encounter: Payer: Self-pay | Admitting: Podiatry

## 2021-09-05 ENCOUNTER — Ambulatory Visit (INDEPENDENT_AMBULATORY_CARE_PROVIDER_SITE_OTHER): Payer: Managed Care, Other (non HMO) | Admitting: Podiatry

## 2021-09-05 DIAGNOSIS — Z992 Dependence on renal dialysis: Secondary | ICD-10-CM

## 2021-09-05 DIAGNOSIS — M79674 Pain in right toe(s): Secondary | ICD-10-CM

## 2021-09-05 DIAGNOSIS — M2141 Flat foot [pes planus] (acquired), right foot: Secondary | ICD-10-CM | POA: Diagnosis not present

## 2021-09-05 DIAGNOSIS — N185 Chronic kidney disease, stage 5: Secondary | ICD-10-CM

## 2021-09-05 DIAGNOSIS — N186 End stage renal disease: Secondary | ICD-10-CM

## 2021-09-05 DIAGNOSIS — M79675 Pain in left toe(s): Secondary | ICD-10-CM

## 2021-09-05 DIAGNOSIS — B351 Tinea unguium: Secondary | ICD-10-CM | POA: Diagnosis not present

## 2021-09-05 DIAGNOSIS — S88112A Complete traumatic amputation at level between knee and ankle, left lower leg, initial encounter: Secondary | ICD-10-CM

## 2021-09-05 DIAGNOSIS — E1022 Type 1 diabetes mellitus with diabetic chronic kidney disease: Secondary | ICD-10-CM

## 2021-09-05 NOTE — Progress Notes (Signed)
This patient returns to my office for at risk foot care.  This patient requires this care by a professional since this patient will be at risk due to having diabetic neuropathy with amputation left leg/foot.  This patient is unable to cut nails himself since the patient cannot reach his nails.These nails are painful walking and wearing shoes.  This patient presents for at risk foot care today.  General Appearance  Alert, conversant and in no acute stress.  Vascular  Dorsalis pedis and posterior tibial  pulses are not  palpable  right .  Capillary return is within normal limits  right. Temperature is within normal limits  right  Neurologic  Senn-Weinstein monofilament wire test absent right. Muscle power within normal limits right  Nails Thick disfigured discolored nails with subungual debris  from hallux to fifth toes right . No evidence of bacterial infection or drainage bilaterally.  Orthopedic  No limitations of motion  feet .  No crepitus or effusions noted.  No bony pathology or digital deformities noted.  Skin  normotropic skin with no porokeratosis noted bilaterally.  No signs of infections or ulcers noted.     Onychomycosis  Pain in right toes    Consent was obtained for treatment procedures.   Mechanical debridement of nails 1-5  right performed with a nail nipper.  Filed with dremel without incident.    Return office visit    3 months                 Told patient to return for periodic foot care and evaluation due to potential at risk complications.   Daoud Lobue DPM   

## 2021-10-16 ENCOUNTER — Ambulatory Visit
Admission: RE | Admit: 2021-10-16 | Discharge: 2021-10-16 | Disposition: A | Discharge status: Home / Self Care | Payer: Managed Care, Other (non HMO) | Source: Ambulatory Visit | Attending: Family Medicine | Admitting: Family Medicine

## 2021-10-16 ENCOUNTER — Ambulatory Visit: Payer: Managed Care, Other (non HMO)

## 2021-10-16 ENCOUNTER — Other Ambulatory Visit: Payer: Self-pay | Admitting: Family Medicine

## 2021-10-16 DIAGNOSIS — N62 Hypertrophy of breast: Secondary | ICD-10-CM

## 2021-10-16 DIAGNOSIS — N644 Mastodynia: Secondary | ICD-10-CM

## 2021-10-22 ENCOUNTER — Other Ambulatory Visit (INDEPENDENT_AMBULATORY_CARE_PROVIDER_SITE_OTHER): Payer: Managed Care, Other (non HMO)

## 2021-10-22 DIAGNOSIS — N62 Hypertrophy of breast: Secondary | ICD-10-CM

## 2021-10-22 LAB — TESTOSTERONE: Testosterone: 273.76 ng/dL — ABNORMAL LOW (ref 300.00–890.00)

## 2021-10-22 LAB — HCG, QUANTITATIVE, PREGNANCY: Quantitative HCG: 0.77 m[IU]/mL

## 2021-10-22 LAB — LUTEINIZING HORMONE: LH: 7.65 m[IU]/mL (ref 1.50–9.30)

## 2021-10-23 ENCOUNTER — Other Ambulatory Visit: Payer: Self-pay | Admitting: Family Medicine

## 2021-10-23 DIAGNOSIS — R7989 Other specified abnormal findings of blood chemistry: Secondary | ICD-10-CM

## 2021-10-23 LAB — ESTRADIOL: Estradiol: 21 pg/mL (ref <=39)

## 2021-10-24 ENCOUNTER — Encounter: Payer: Self-pay | Admitting: Family Medicine

## 2021-10-29 ENCOUNTER — Other Ambulatory Visit: Payer: Managed Care, Other (non HMO)

## 2021-11-01 ENCOUNTER — Other Ambulatory Visit: Payer: Self-pay | Admitting: Internal Medicine

## 2021-11-01 ENCOUNTER — Other Ambulatory Visit (INDEPENDENT_AMBULATORY_CARE_PROVIDER_SITE_OTHER): Payer: Managed Care, Other (non HMO)

## 2021-11-01 DIAGNOSIS — R7989 Other specified abnormal findings of blood chemistry: Secondary | ICD-10-CM | POA: Diagnosis not present

## 2021-11-01 LAB — TESTOSTERONE: Testosterone: 239.76 ng/dL — ABNORMAL LOW (ref 300.00–890.00)

## 2021-11-02 LAB — PROLACTIN: Prolactin: 32.6 ng/mL — ABNORMAL HIGH (ref 2.0–18.0)

## 2021-11-18 ENCOUNTER — Encounter: Payer: Self-pay | Admitting: Podiatry

## 2021-11-21 ENCOUNTER — Other Ambulatory Visit: Payer: Self-pay | Admitting: Internal Medicine

## 2021-12-06 ENCOUNTER — Ambulatory Visit (INDEPENDENT_AMBULATORY_CARE_PROVIDER_SITE_OTHER): Payer: Managed Care, Other (non HMO) | Admitting: Internal Medicine

## 2021-12-06 ENCOUNTER — Encounter: Payer: Self-pay | Admitting: Internal Medicine

## 2021-12-06 VITALS — BP 130/72 | HR 66 | Ht 70.0 in | Wt 245.2 lb

## 2021-12-06 DIAGNOSIS — E103593 Type 1 diabetes mellitus with proliferative diabetic retinopathy without macular edema, bilateral: Secondary | ICD-10-CM | POA: Diagnosis not present

## 2021-12-06 DIAGNOSIS — E1059 Type 1 diabetes mellitus with other circulatory complications: Secondary | ICD-10-CM

## 2021-12-06 DIAGNOSIS — N186 End stage renal disease: Secondary | ICD-10-CM

## 2021-12-06 DIAGNOSIS — Z89512 Acquired absence of left leg below knee: Secondary | ICD-10-CM | POA: Diagnosis not present

## 2021-12-06 DIAGNOSIS — E291 Testicular hypofunction: Secondary | ICD-10-CM | POA: Diagnosis not present

## 2021-12-06 DIAGNOSIS — Z992 Dependence on renal dialysis: Secondary | ICD-10-CM

## 2021-12-06 DIAGNOSIS — E1042 Type 1 diabetes mellitus with diabetic polyneuropathy: Secondary | ICD-10-CM | POA: Diagnosis not present

## 2021-12-06 DIAGNOSIS — E1022 Type 1 diabetes mellitus with diabetic chronic kidney disease: Secondary | ICD-10-CM

## 2021-12-06 DIAGNOSIS — S40021A Contusion of right upper arm, initial encounter: Secondary | ICD-10-CM

## 2021-12-06 LAB — BASIC METABOLIC PANEL
BUN: 33 mg/dL — ABNORMAL HIGH (ref 6–23)
CO2: 29 mEq/L (ref 19–32)
Calcium: 8.8 mg/dL (ref 8.4–10.5)
Chloride: 101 mEq/L (ref 96–112)
Creatinine, Ser: 3.66 mg/dL — ABNORMAL HIGH (ref 0.40–1.50)
GFR: 17.5 mL/min — ABNORMAL LOW (ref >60.00)
Glucose, Bld: 163 mg/dL — ABNORMAL HIGH (ref 70–99)
Potassium: 4.4 mEq/L (ref 3.5–5.1)
Sodium: 136 mEq/L (ref 135–145)

## 2021-12-06 LAB — CBC
HCT: 34.2 % — ABNORMAL LOW (ref 39.0–52.0)
Hemoglobin: 11.9 g/dL — ABNORMAL LOW (ref 13.0–17.0)
MCHC: 34.9 g/dL (ref 30.0–36.0)
MCV: 97.6 fl (ref 78.0–100.0)
Platelets: 150 10*3/uL (ref 150.0–400.0)
RBC: 3.5 Mil/uL — ABNORMAL LOW (ref 4.22–5.81)
RDW: 13.6 % (ref 11.5–15.5)
WBC: 8.6 10*3/uL (ref 4.0–10.5)

## 2021-12-06 LAB — POCT GLYCOSYLATED HEMOGLOBIN (HGB A1C): Hemoglobin A1C: 6.9 % — AB (ref 4.0–5.6)

## 2021-12-06 LAB — LUTEINIZING HORMONE: LH: 5.87 m[IU]/mL (ref 1.50–9.30)

## 2021-12-06 LAB — T4, FREE: Free T4: 0.84 ng/dL (ref 0.60–1.60)

## 2021-12-06 LAB — PSA: PSA: 0.16 ng/mL (ref 0.10–4.00)

## 2021-12-06 LAB — TSH: TSH: 3.59 u[IU]/mL (ref 0.35–5.50)

## 2021-12-06 MED ORDER — HUMULIN N KWIKPEN 100 UNIT/ML ~~LOC~~ SUPN: 65.0000 [IU] | PEN_INJECTOR | Freq: Every day | SUBCUTANEOUS | 6 refills | Status: DC

## 2021-12-06 NOTE — Patient Instructions (Signed)
-   Tresiba  40  units twice a day  - Humalog 10-15 units with each meal  - NPH 65  units with the start of each dialysis   HOW TO TREAT LOW BLOOD SUGARS (Blood sugar LESS THAN 70 MG/DL) Please follow the RULE OF 15 for the treatment of hypoglycemia treatment (when your (blood sugars are less than 70 mg/dL)   STEP 1: Take 15 grams of carbohydrates when your blood sugar is low, which includes:  3-4 GLUCOSE TABS  OR 3-4 OZ OF JUICE OR REGULAR SODA OR ONE TUBE OF GLUCOSE GEL    STEP 2: RECHECK blood sugar in 15 MINUTES STEP 3: If your blood sugar is still low at the 15 minute recheck --> then, go back to STEP 1 and treat AGAIN with another 15 grams of carbohydrates.

## 2021-12-06 NOTE — Progress Notes (Signed)
Name: Chase Clark  Age/ Sex: 56 y.o., male   MRN/ DOB: [id number], 05-24-1965     PCP: [name], MD   Reason for Endocrinology Evaluation: Type 1 Diabetes Mellitus  Initial Endocrine Consultative Visit: 06/29/2019    PATIENT IDENTIFIER: Chase Clark is a 56 y.o. 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 The patient has followed with Endocrinology clinic since 06/29/2019 for consultative assistance with management of his diabetes.  DIABETIC HISTORY:  Chase Clark was diagnosed with  T1DM in 1990, was initially diagnosed as type 2 until years ago when he was diagnosed with T1DM, has been on insulin for ~ 15 yrs. He tried Ozempic 04/2019 with GI intolerance. His hemoglobin A1c has ranged from 5.9%  in 2021, peaking at 15.3%in 2019.    SUBJECTIVE:   During the last visit (06/07/2021): A1c 6.3 %. Adjusted MDi regimen      Today (12/06/2021): Chase Clark is here for a follow up on diabetes management.  He is accompanied by his wife Chase Clark today.He checks his glucose multiple times a day through the dexcom   Patient has been evaluated at the kidney/pancreas transplant at [facility] in June 2023  Since his last visit here he was presented to his PCP for gynecomastia, during evaluation he was noted with low testosterone (239.76 NG/DL in July 2023) with slight elevation in prolactin 32.6 NG/mL and inappropriately normal LH at 7.36mU/mL .  Mammogram 10/16/2021 was unrevealing except for bilateral gynecomastia and no malignancy.   Follows with Dr. PHilarie Fredricksonfor colonic adenomas and elevated LFT's   Has chronic loose stools ,  that he attributes to PD. No nausea  He is on peritoneal dialysis  Extronel during the day 2.5 L  At night uses two bags of 6 Liters 2.5 % dextrose ~ 300 grams of CHO    Pt with erectile dysfunction for many years, developed headaches  No hx pf DVT/PE  No personal or  FH of prostate cancer   Has noted a right antecubital fossa knot, this is decreasing in size, bruising noted     HOME DIABETES REGIMEN:  Tresiba 40 units BID Humalog  1020 each meal  NPH 55  units at the start of peritoneal dialysis     Statin: yes ACE-I/ARB: yes    CONTINUOUS GLUCOSE MONITORING RECORD INTERPRETATION    Dates of Recording: 8/12-8/25/2023  Sensor description:dexcom   Results statistics:   CGM use % of time 93  Average and SD 191/52  Time in range  43%  % Time Above 180 42  % Time above 250 14  % Time Below target <1   Glycemic patterns summary: Hyperglycemia noted overnight with BG's trending during the day   Hyperglycemic episodes  overnight during dialysis and postprandial   Hypoglycemic episodes post bolus   Overnight periods: elevated        DIABETIC COMPLICATIONS: Microvascular complications:  Neuropathy, CKD IV , 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, + 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  Last Eye Exam: Completed 04/2021   Macrovascular complications:  CAD (S/P CABG 01/2019)  Denies: CVA, PVD   HISTORY:  Past Medical History:  Past Medical History:  Diagnosis Date   Anemia    Arthritis    Asthma    as child   Barrett's esophagus 02/16/2014   short segment   Cataract    Chronic kidney disease (CKD), stage IV (severe) (HCC)    Coronary artery  disease    DDD (degenerative disc disease), cervical    Diabetes mellitus without complication ([place])    Diabetic osteomyelitis ([place])    Diabetic peripheral neuropathy ([place])    Diabetic ulcer of foot with muscle involvement without evidence of necrosis ([place])    DIABETIC ULCERATIONS ASSOCIATED WITH IRRITATION LATERAL ANKLE LEFT GREATER THAN RIGHT WITH MILD CELLULITIS    Diverticulosis    DKA (diabetic ketoacidoses) 08/23/2017   Edema, lower extremity    Fatty liver    Gastroparesis 2015   GERD (gastroesophageal reflux disease)    Gilbert's disease    Glaucoma    Hx of BKA, left (HCC)    Hx of CABG 01/27/2019   2  vessels; LIMA-LAD, SVG-D1   Hx of heart artery stent 11/20/2009   80% D1 stenosis - 2.5 x 18 mm Xience V Everolimus (DES) stent x 1 placed   Hyperlipidemia    Hypertension    Left below-knee amputee ([place])    Legally blind    Myocardial infarction ([place]) 11/2009   Neuropathy    Osteoarthritis of spine    Osteomyelitis of left foot ([place])    Peritoneal dialysis catheter in place [facility])    Does dialysis over night.   PONV (postoperative nausea and vomiting)    PVD (peripheral vascular disease) (HCC)    Seasonal allergies    Shoulder pain    Tubular adenoma of colon    Ulcer of foot ([place]) 11/17/2012   DIABETIC ULCERATIONS ASSOCIATED WITH IRRITATION LATERAL ANKLE LEFT GREATER THAN RIGHT WITH MILD CELLULITIS   Past Surgical History:  Past Surgical History:  Procedure Laterality Date   ABDOMINAL AORTOGRAM N/A 05/20/2016   Procedure: Abdominal Aortogram possible intervention;  Surgeon: Katha Cabal, MD;  Location: [place] CV [facility];  Service: Cardiovascular;  Laterality: N/A;   AMPUTATION Left 09/05/2017   Procedure: AMPUTATION BELOW KNEE;  Surgeon: Tania Ade, MD;  Location: [place];  Service: Orthopedics;  Laterality: Left;   ANTERIOR CERVICAL DECOMP/DISCECTOMY FUSION N/A 05/07/2017   Procedure: ANTERIOR CERVICAL DECOMPRESSION/DISCECTOMY Luevenia Maxin PROSTHESIS,PLATE/SCREWS CERVICAL FIVE - CERVICAL SIX;  Surgeon: Newman Pies, MD;  Location: [place];  Service: Neurosurgery;  Laterality: N/A;   CAPD INSERTION N/A 08/30/2020   Procedure: LAPAROSCOPIC INSERTION CONTINUOUS AMBULATORY PERITONEAL DIALYSIS  (CAPD) CATHETER;  Surgeon: Olean Ree, MD;  Location: [facility];  Service: General;  Laterality: N/A;   CATARACT EXTRACTION W/ INTRAOCULAR LENS IMPLANT     CATARACT EXTRACTION W/PHACO Left 02/26/2017   Procedure: CATARACT EXTRACTION PHACO AND INTRAOCULAR LENS PLACEMENT ([place]);  Surgeon: Eulogio Bear, MD;  Location: [facility];  Service: Ophthalmology;  Laterality: Left;   Lot # [id number] H [place]: 00:25.3 AP%: 6.2 CDE: 1.59    CHOLECYSTECTOMY N/A    COLONOSCOPY WITH PROPOFOL N/A 05/30/2021   Procedure: COLONOSCOPY WITH PROPOFOL;  Surgeon: Jerene Bears, MD;  Location: [facility];  Service: Gastroenterology;  Laterality: N/A;   CORONARY ANGIOPLASTY WITH STENT PLACEMENT  11/20/2009   80% D1 stenosis - 2.5 x 18 mm Xience V Everolimus (DES) stent x 1 placed; LOCATION: [facility]; SURGEON: Bartholome Bill, MD   CORONARY ARTERY BYPASS GRAFT N/A 01/27/2019   Procedure: OFF PUMP CORONARY ARTERY BYPASS GRAFTING (CABG) X 2 WITH ENDOSCOPIC HARVESTING OF RIGHT GREATER SAPHENOUS VEIN. LIMA TO LAD;  Surgeon: Lajuana Matte, MD;  Location: [place];  Service: Open Heart Surgery;  Laterality: N/A;   CORONARY STENT INTERVENTION N/A 01/21/2019   Procedure: CORONARY STENT INTERVENTION;  Surgeon: Belva Crome, MD;  Location: [facility]  INVASIVE CV LAB;  Service: Cardiovascular;  Laterality: N/A;   EPIBLEPHERON REPAIR WITH TEAR DUCT PROBING     EYE SURGERY Right 02/09/2014   cataract extraction   INSERTION EXPRESS TUBE SHUNT Right 09/06/2015   Procedure: INSERTION AHMED TUBE SHUNT with tutoplast allograft;  Surgeon: Eulogio Bear, MD;  Location: [facility];  Service: Ophthalmology;  Laterality: Right;   laparoscopic insertion continuous peritoneal dialysis  2022   LEFT HEART CATH AND CORONARY ANGIOGRAPHY N/A 01/20/2019   Procedure: LEFT HEART CATH AND CORONARY ANGIOGRAPHY;  Surgeon: Lorretta Harp, MD;  Location: [place] CV [facility];  Service: Cardiovascular;  Laterality: N/A;   POLYPECTOMY  05/30/2021   Procedure: POLYPECTOMY;  Surgeon: Jerene Bears, MD;  Location[facility];  Service: Gastroenterology;;   TEE WITHOUT CARDIOVERSION N/A 01/27/2019   Procedure: TRANSESOPHAGEAL ECHOCARDIOGRAM (TEE);  Surgeon: Lajuana Matte, MD;  Location: [place];  Service: Open Heart Surgery;  Laterality: N/A;   VASECTOMY     Social History:  reports that he quit smoking about 13 years ago. His  smoking use included cigarettes. He has never used smokeless tobacco. He reports that he does not currently use alcohol. He reports that he does not use drugs. Family History:  Family History  Problem Relation Age of Onset   Hyperlipidemia Mother    Diabetes Mother    Liver disease Mother        NASH   Other Mother        immune thrombocytopenic pupura (ITP)   Hyperlipidemia Father    Diabetes Father    Heart disease Father    Hypertension Father    Lymphoma Father    Hemochromatosis Other        Grandmother (? which side)   Polycythemia Other    Colon cancer Neg Hx    Esophageal cancer Neg Hx    Rectal cancer Neg Hx    Stomach cancer Neg Hx      HOME MEDICATIONS: Allergies as of 12/06/2021   No Known Allergies      Medication List        Accurate as of December 06, 2021  9:32 AM. If you have any questions, ask your nurse or doctor.          acetaminophen 325 MG tablet Commonly known as: TYLENOL Take 1 tablet (325 mg total) by mouth every 6 (six) hours as needed for mild pain (pain score 1-3 or temp > 100.5).   aspirin 81 MG chewable tablet Commonly known as: Aspirin Childrens Chew 1 tablet (81 mg total) by mouth daily.   atorvastatin 80 MG tablet Commonly known as: LIPITOR TAKE 1 TABLET BY MOUTH EVERY DAY What changed: when to take this   brimonidine-timolol 0.2-0.5 % ophthalmic solution Commonly known as: COMBIGAN Place 1 drop into both eyes every 12 (twelve) hours.   Dexcom G6 Transmitter Misc Inject 1 Device into the skin as directed. Use to check blood sugar at least 4 times daily   Dexcom G7 Sensor Misc 1 Device by Does not apply route as directed.   dorzolamide 2 % ophthalmic solution Commonly known as: TRUSOPT Place 1 drop into both eyes 2 (two) times daily.   esomeprazole 20 MG capsule Commonly known as: NEXIUM Take 20 mg by mouth daily.   ezetimibe 10 MG tablet Commonly known as: ZETIA TAKE 1 TABLET BY MOUTH EVERY DAY   Flomax 0.4 MG  Caps capsule Generic drug: tamsulosin 0.4 mg at bedtime.   fluconazole 100 MG  tablet Commonly known as: Diflucan Take 1 tablet (100 mg total) by mouth once a week for 26 doses.   fluticasone 50 MCG/ACT nasal spray Commonly known as: FLONASE SPRAY 2 SPRAYS INTO EACH NOSTRIL EVERY DAY What changed: See the new instructions.   gabapentin 300 MG capsule Commonly known as: NEURONTIN TAKE 1 CAPSULE BY MOUTH TWICE A DAY What changed: when to take this   gentamicin cream 0.1 % Commonly known as: GARAMYCIN Apply 1 application topically every other day. Around port   HumuLIN N KwikPen 100 UNIT/ML Kiwkpen Generic drug: Insulin NPH (Human) (Isophane) Inject 65 Units into the skin daily. What changed: how much to take Changed by: Dorita Sciara, MD   insulin lispro 100 UNIT/ML KwikPen Commonly known as: HumaLOG KwikPen Max Daily 50 units What changed:  how much to take when to take this reasons to take this additional instructions   Insulin Pen Needle 32G X 4 MM Misc Inject 1 Device into the skin 5 (five) times daily. Use to inject Lantus and Novolog up to 5 times daily   ketoconazole 2 % cream Commonly known as: NIZORAL APPLY 1 APPLICATION TOPICALLY DAILY   lactulose 10 GM/15ML solution Commonly known as: CHRONULAC TAKE 30 MLS (20 G TOTAL) BY MOUTH AS NEEDED.   latanoprost 0.005 % ophthalmic solution Commonly known as: XALATAN Place 1 drop into both eyes 2 (two) times daily.   LUBRICANT EYE DROPS OP Place 1 drop into both eyes 2 (two) times daily.   metoCLOPramide 10 MG tablet Commonly known as: REGLAN TAKE 1 TABLET BY MOUTH EVERYDAY AT BEDTIME   midodrine 5 MG tablet Commonly known as: PROAMATINE Take 5 mg by mouth 2 (two) times daily as needed (Below 100).   MULTIVITAMIN ADULT PO Take 1 tablet by mouth daily.   ondansetron 4 MG disintegrating tablet Commonly known as: ZOFRAN-ODT Take 4 mg by mouth every 8 (eight) hours as needed for nausea or  vomiting.   PARoxetine 10 MG tablet Commonly known as: PAXIL Take 10 mg by mouth daily.   prednisoLONE acetate 1 % ophthalmic suspension Commonly known as: PRED FORTE Place 1 drop into the left eye 4 (four) times daily.   senna 8.6 MG tablet Commonly known as: SENOKOT Take 2 tablets by mouth daily.   torsemide 20 MG tablet Commonly known as: DEMADEX Take 20-40 mg by mouth See admin instructions. Take 40 mg in the morning and 20 mg in the afternoon   Tresiba FlexTouch 200 UNIT/ML FlexTouch Pen Generic drug: insulin degludec INJECT 40 UNITS INTO THE SKIN 2 (TWO) TIMES DAILY.   Vitamin D 50 MCG (2000 UT) Caps Take 2,000 Units by mouth daily.         OBJECTIVE:   Vital Signs: BP 130/72 (BP Location: Left Arm, Patient Position: Sitting, Cuff Size: Normal)   Pulse 66   Ht '5\' 10"'$  (1.778 m)   Wt 245 lb 3.2 oz (111.2 kg)   SpO2 97%   BMI 35.18 kg/m   Wt Readings from Last 3 Encounters:  12/06/21 245 lb 3.2 oz (111.2 kg)  08/26/21 231 lb (104.8 kg)  06/07/21 226 lb (102.5 kg)     Exam: General: Pt appears well and is in NAD  Lungs: Clear with good BS bilat with no rales, rhonchi, or wheezes  Heart: RRR   Neuro: MS is good with appropriate affect, pt is alert and Ox3        DATA REVIEWED:  Lab Results  Component Value Date  HGBA1C 6.9 (A) 12/06/2021   HGBA1C 6.6 (A) 11/15/2020   HGBA1C 7.0 (H) 06/05/2020    09/13/2021 '@Care'$  Everywhere A1c 6.9% Average blood glucose 153 mg/DL    ASSESSMENT / PLAN / RECOMMENDATIONS:   1)Type 1 Diabetes Mellitus, optimally controlled, With ESRD on PD Hx of left BKA, retinopathy and macrovascular complications - Most recent A1c of 6.9 %. Goal A1c < 7.5 %.    -In review of his CGM download the patient has been noted with continued hyperglycemia overnight at the time of initiation of peritoneal dialysis.  Hence we will increase NPH as below -CGM download shows an overall increase in his average BG's  from 176  mg/DL to 191  mg/DL -I am going to increase his NPH as below -He has also been noted with tight to hypoglycemic episodes post bolus, per spouse the patient takes a baseline of 10 units of Humalog, but if he consumes starch she double that up to 20 units.  I did advise the patient to take 15 units as a max dose for now as doubling up on the baseline of prandial dose seems to be a bit too much for him -BMP shows normal electrolytes and normal TFTs  MEDICATIONS: -Continue Tresiba 40  units twice a day  -Continue Humalog 10-15 units with each meal  -Increase NPH 65 units at the beginning of dialysis     EDUCATION / INSTRUCTIONS: BG monitoring instructions: Patient is instructed to check his blood sugars 4 times a day, before meals and bedtime  Call [place] [facility] if: BG persistently < 70  I reviewed the Rule of 15 for the treatment of hypoglycemia in detail with the patient. Literature supplied.   2) Diabetic complications:  Eye: Does  have known diabetic retinopathy.  Neuro/ Feet: Does have known diabetic peripheral neuropathy .  Renal: Patient does  have known baseline CKD. He   is on an ACEI/ARB at present.    3) Hypogonadism:   -Patient with erectile dysfunction, I did explain to him that this is most likely multifactorial given low testosterone, neuropathy, and ESRD -He has a pending referral to urology -We will repeat testosterone levels today, we discussed starting topical testosterone replacement therapy -We discussed side effects of testosterone therapy such as erythropoiesis, worsening of sleep apnea that is severe and untreated,  Prostate volumes and serum PSA increase in response to testosterone treatment which might increase BPH and worsen urinary outflow obstruction as well as prostate cancer risk.  - We also discussed there is a possibility of increased cardiovascular risk associated with testosterone use. -PSA and CBC are normal     F/U in 6 months    Signed  electronically by: Mack Guise, MD  [facility]  [facility] [address] [place], [place] [address] Phone: [phone] FAX: [phone]   CC: [name], MD [address] [place] [address] Phone: [phone]  Fax: [phone]  Return to Endocrinology clinic as below: Future Appointments  Date Time Provider [place]  12/12/2021  4:15 PM Gardiner Barefoot, DPM [facility]  02/28/2022 10:30 AM [name], MD [facility]  04/10/2022 11:30 AM Melinda Gwinner, Melanie Crazier, MD [facility] None

## 2021-12-09 LAB — TESTOSTERONE, TOTAL, LC/MS/MS: Testosterone, Total, LC-MS-MS: 176 ng/dL — ABNORMAL LOW (ref 250–1100)

## 2021-12-09 LAB — PROLACTIN: Prolactin: 48.5 ng/mL — ABNORMAL HIGH (ref 2.0–18.0)

## 2021-12-10 ENCOUNTER — Other Ambulatory Visit: Payer: Self-pay | Admitting: Internal Medicine

## 2021-12-10 ENCOUNTER — Telehealth: Payer: Self-pay | Admitting: Internal Medicine

## 2021-12-10 DIAGNOSIS — E230 Hypopituitarism: Secondary | ICD-10-CM

## 2021-12-10 NOTE — Telephone Encounter (Signed)
Please let the patient know that his testosterone has come back lower than it has been in the past, and based on the rest of his hormones with higher number of prolactin I would recommend proceeding with brain imaging to check his pituitary gland before starting him on any testosterone treatment at this time     If he is okay with this let me know so I can order an MRI   Thanks

## 2021-12-10 NOTE — Telephone Encounter (Signed)
Attempted to contact the patient, LVM for a call back. 

## 2021-12-10 NOTE — Telephone Encounter (Signed)
Patient's spouse has now been informed of the patient's results and expresses understanding. They both agree to the MRI and would like to put in a reuqest for the MRI to be performed at [facility] located at [address], [place], [place] [address]

## 2021-12-12 ENCOUNTER — Ambulatory Visit (INDEPENDENT_AMBULATORY_CARE_PROVIDER_SITE_OTHER): Payer: Managed Care, Other (non HMO) | Admitting: Podiatry

## 2021-12-12 ENCOUNTER — Other Ambulatory Visit: Payer: Self-pay | Admitting: Podiatry

## 2021-12-12 ENCOUNTER — Encounter: Payer: Self-pay | Admitting: Podiatry

## 2021-12-12 DIAGNOSIS — N185 Chronic kidney disease, stage 5: Secondary | ICD-10-CM

## 2021-12-12 DIAGNOSIS — S88112A Complete traumatic amputation at level between knee and ankle, left lower leg, initial encounter: Secondary | ICD-10-CM | POA: Diagnosis not present

## 2021-12-12 DIAGNOSIS — B351 Tinea unguium: Secondary | ICD-10-CM | POA: Diagnosis not present

## 2021-12-12 DIAGNOSIS — Z992 Dependence on renal dialysis: Secondary | ICD-10-CM

## 2021-12-12 DIAGNOSIS — E1022 Type 1 diabetes mellitus with diabetic chronic kidney disease: Secondary | ICD-10-CM

## 2021-12-12 DIAGNOSIS — M79675 Pain in left toe(s): Secondary | ICD-10-CM

## 2021-12-12 DIAGNOSIS — M2141 Flat foot [pes planus] (acquired), right foot: Secondary | ICD-10-CM

## 2021-12-12 DIAGNOSIS — N186 End stage renal disease: Secondary | ICD-10-CM

## 2021-12-12 DIAGNOSIS — M79674 Pain in right toe(s): Secondary | ICD-10-CM

## 2021-12-12 NOTE — Progress Notes (Signed)
This patient returns to my office for at risk foot care.  This patient requires this care by a professional since this patient will be at risk due to having diabetic neuropathy with amputation left leg/foot.  This patient is unable to cut nails himself since the patient cannot reach his nails.These nails are painful walking and wearing shoes.  This patient presents for at risk foot care today.  General Appearance  Alert, conversant and in no acute stress.  Vascular  Dorsalis pedis and posterior tibial  pulses are not  palpable  right .  Capillary return is within normal limits  right. Temperature is within normal limits  right  Neurologic  Senn-Weinstein monofilament wire test absent right. Muscle power within normal limits right  Nails Thick disfigured discolored nails with subungual debris  from hallux to fifth toes right . No evidence of bacterial infection or drainage bilaterally.  Orthopedic  No limitations of motion  feet .  No crepitus or effusions noted.  No bony pathology or digital deformities noted.  Skin  normotropic skin with no porokeratosis noted bilaterally.  No signs of infections or ulcers noted.     Onychomycosis  Pain in right toes    Consent was obtained for treatment procedures.   Mechanical debridement of nails 1-5  right performed with a nail nipper.  Filed with dremel without incident.    Return office visit    3 months                 Told patient to return for periodic foot care and evaluation due to potential at risk complications.   Brilynn Biasi DPM   

## 2021-12-28 ENCOUNTER — Other Ambulatory Visit: Payer: Self-pay | Admitting: Internal Medicine

## 2021-12-28 DIAGNOSIS — E103593 Type 1 diabetes mellitus with proliferative diabetic retinopathy without macular edema, bilateral: Secondary | ICD-10-CM

## 2022-01-01 ENCOUNTER — Encounter: Payer: Managed Care, Other (non HMO) | Admitting: Podiatry

## 2022-01-07 ENCOUNTER — Telehealth: Payer: Self-pay | Admitting: Internal Medicine

## 2022-01-07 NOTE — Telephone Encounter (Signed)
Can you please check on the status of brain MRI on this patient?    Thank you

## 2022-01-13 ENCOUNTER — Encounter: Payer: Managed Care, Other (non HMO) | Admitting: Podiatry

## 2022-01-28 ENCOUNTER — Other Ambulatory Visit: Payer: Self-pay | Admitting: Internal Medicine

## 2022-01-28 ENCOUNTER — Other Ambulatory Visit: Payer: Self-pay | Admitting: Family Medicine

## 2022-02-05 ENCOUNTER — Ambulatory Visit (INDEPENDENT_AMBULATORY_CARE_PROVIDER_SITE_OTHER): Payer: Managed Care, Other (non HMO) | Admitting: Podiatry

## 2022-02-05 DIAGNOSIS — R0989 Other specified symptoms and signs involving the circulatory and respiratory systems: Secondary | ICD-10-CM

## 2022-02-05 DIAGNOSIS — M79675 Pain in left toe(s): Secondary | ICD-10-CM | POA: Diagnosis not present

## 2022-02-05 DIAGNOSIS — B351 Tinea unguium: Secondary | ICD-10-CM

## 2022-02-05 DIAGNOSIS — M79674 Pain in right toe(s): Secondary | ICD-10-CM

## 2022-02-05 NOTE — Patient Instructions (Signed)
Call Phone: [phone] to schedule your vascular testing

## 2022-02-10 ENCOUNTER — Encounter (INDEPENDENT_AMBULATORY_CARE_PROVIDER_SITE_OTHER): Payer: Self-pay

## 2022-02-10 NOTE — Progress Notes (Signed)
  Subjective:  Patient ID: Chase Clark, male    DOB: October 31, 1965,  MRN: [id number]  Diabetic foot exam, interested in diabetic shoes, previous amputation left leg  56 y.o. male returns for follow-up with the above complaint. History confirmed with patient.  He has completed the medication Objective:  Physical Exam: warm, good capillary refill, no trophic changes or ulcerative lesions, and +1 DP and PT pulses.  He has minimal sensation below the knee on the right leg.  Interdigital tinea pedis has resolved there is proximal clearing in the nail plates    Assessment:   1. Pain due to onychomycosis of toenails of both feet   2. Weak pulse       Plan:  Patient was evaluated and treated and all questions answered.  So has not had significant improvement since beginning therapy.  We discussed permanent matricectomy.  Discussed the risk and benefits of this.  His DP pulse was palpable but his PT pulse was weaker.  I did recommend ABIs prior to proceeding with permanent matricectomy and he will schedule this and I will see him back after for the procedure.  Return for follow after vascular testing.

## 2022-02-26 ENCOUNTER — Telehealth: Payer: Self-pay

## 2022-02-26 DIAGNOSIS — E103593 Type 1 diabetes mellitus with proliferative diabetic retinopathy without macular edema, bilateral: Secondary | ICD-10-CM

## 2022-02-26 MED ORDER — INSULIN LISPRO (1 UNIT DIAL) 100 UNIT/ML (KWIKPEN): PEN_INJECTOR | SUBCUTANEOUS | 1 refills | Status: DC

## 2022-02-26 NOTE — Telephone Encounter (Signed)
[place] states that patient has appointment for MRI on 03/12/22 and authorization needs to be obtain.  NPI: [id number] TAX [id number]

## 2022-02-26 NOTE — Telephone Encounter (Signed)
Pt's wife called to provide a number for where the pt's Humalog can be sent to. Number [phone] for [facility]. Pt's wife was advised rx for Humalog had been e-scribed to the pharmacy while on the phone, she states she was advised we could call in the rx to number provided. Advised our system showed: E-Prescribing Status: Receipt confirmed by pharmacy (02/26/2022  3:40 PM EST).

## 2022-02-27 NOTE — Telephone Encounter (Signed)
Spoke with Mrs. Klumpp and advised that I would contact [facility] to make sure they have everything they need.

## 2022-02-27 NOTE — Telephone Encounter (Signed)
Spoke with [facility] and they are processing Humalog script for patient. Patient spouse notified.

## 2022-02-28 ENCOUNTER — Encounter: Payer: Self-pay | Admitting: Family Medicine

## 2022-02-28 ENCOUNTER — Ambulatory Visit (INDEPENDENT_AMBULATORY_CARE_PROVIDER_SITE_OTHER): Payer: Managed Care, Other (non HMO) | Admitting: Family Medicine

## 2022-02-28 VITALS — BP 138/84 | HR 66 | Temp 97.9°F | Ht 70.0 in | Wt 241.6 lb

## 2022-02-28 DIAGNOSIS — E785 Hyperlipidemia, unspecified: Secondary | ICD-10-CM | POA: Diagnosis not present

## 2022-02-28 DIAGNOSIS — Z23 Encounter for immunization: Secondary | ICD-10-CM

## 2022-02-28 DIAGNOSIS — I152 Hypertension secondary to endocrine disorders: Secondary | ICD-10-CM

## 2022-02-28 DIAGNOSIS — E103593 Type 1 diabetes mellitus with proliferative diabetic retinopathy without macular edema, bilateral: Secondary | ICD-10-CM | POA: Diagnosis not present

## 2022-02-28 DIAGNOSIS — N185 Chronic kidney disease, stage 5: Secondary | ICD-10-CM

## 2022-02-28 DIAGNOSIS — E1159 Type 2 diabetes mellitus with other circulatory complications: Secondary | ICD-10-CM | POA: Diagnosis not present

## 2022-02-28 DIAGNOSIS — E291 Testicular hypofunction: Secondary | ICD-10-CM

## 2022-02-28 DIAGNOSIS — M7061 Trochanteric bursitis, right hip: Secondary | ICD-10-CM | POA: Insufficient documentation

## 2022-02-28 NOTE — Assessment & Plan Note (Signed)
Patient will proceed with PT as planned.

## 2022-02-28 NOTE — Assessment & Plan Note (Signed)
He will continue to follow with nephrology.

## 2022-02-28 NOTE — Assessment & Plan Note (Signed)
Blood pressure is generally well controlled at home.  They will continue to monitor and follow with his nephrologist.

## 2022-02-28 NOTE — Assessment & Plan Note (Signed)
He will complete his work-up with endocrinology.

## 2022-02-28 NOTE — Addendum Note (Signed)
Addended by: Jeralyn Bennett A on: 02/28/2022 12:43 PM   Modules accepted: Orders

## 2022-02-28 NOTE — Assessment & Plan Note (Signed)
A1c recently well controlled.  He will continue Tresiba 40 units twice daily, NPH 65 units daily, and sliding scale Humalog.  He will continue to see his endocrinologist as well.

## 2022-02-28 NOTE — Progress Notes (Signed)
Chase Rumps, MD Phone: [phone]  Chase Clark is a 56 y.o. male who presents today for follow-up.  DIABETES Disease Monitoring: Blood Sugar ranges-last A1c well controlled Polyuria/phagia/dipsia- no      Optho- UTD Medications: Compliance- taking tresiba 40 u BID, NPH 65 u daily, humalog SS   Hypoglycemic symptoms- not frequent  HYPERLIPIDEMIA Symptoms Chest pain on exertion:  no   Leg claudication:   no Medications: Compliance- taking lipitor Right upper quadrant pain- no  Muscle aches- no Lipid Panel     Component Value Date/Time   CHOL 105 08/26/2021 1627   TRIG 233.0 (H) 08/26/2021 1627   HDL 30.80 (L) 08/26/2021 1627   CHOLHDL 3 08/26/2021 1627   VLDL 46.6 (H) 08/26/2021 1627   LDLCALC 55 05/24/2019 0905   LDLDIRECT 46.0 08/26/2021 1627   Bursitis, right hip: Patient has had a couple of injections that have helped some.  He starting physical therapy in the near future.  Hypogonadism: Patient has been following with endocrinology.  They are getting an MRI to look at his pituitary.  He has not started any testosterone supplementation.  Elevated blood pressure: They report his blood pressure is up and down though typically is between 120-130/70s.  Reports the nephrologist does not want his blood pressure to drop below.  Has not required any midodrine recently.  ESRD: Patient reports he is in the process of potentially getting a donor kidney.   Social History   Tobacco Use  Smoking Status Former   Packs/day: 0.50   Years: 20.00   Total pack years: 10.00   Types: Cigarettes   Quit date: 03/14/2008   Years since quitting: 13.9  Smokeless Tobacco Never    Current Outpatient Medications on File Prior to Visit  Medication Sig Dispense Refill   acetaminophen (TYLENOL) 325 MG tablet Take 1 tablet (325 mg total) by mouth every 6 (six) hours as needed for mild pain (pain score 1-3 or temp > 100.5). 30 tablet 0   aspirin (ASPIRIN CHILDRENS) 81 MG chewable tablet  Chew 1 tablet (81 mg total) by mouth daily. 30 tablet 1   atorvastatin (LIPITOR) 80 MG tablet TAKE 1 TABLET BY MOUTH EVERY DAY (Patient taking differently: Take 80 mg by mouth at bedtime.) 90 tablet 1   brimonidine-timolol (COMBIGAN) 0.2-0.5 % ophthalmic solution Place 1 drop into both eyes every 12 (twelve) hours.     Carboxymethylcellulose Sodium (LUBRICANT EYE DROPS OP) Place 1 drop into both eyes 2 (two) times daily.     Cholecalciferol (VITAMIN D) 50 MCG (2000 UT) CAPS Take 2,000 Units by mouth daily.     Continuous Blood Gluc Sensor (DEXCOM G7 SENSOR) MISC 1 Device by Does not apply route as directed. 9 each 3   Continuous Blood Gluc Transmit (DEXCOM G6 TRANSMITTER) MISC Inject 1 Device into the skin as directed. Use to check blood sugar at least 4 times daily 1 each 1   dorzolamide (TRUSOPT) 2 % ophthalmic solution Place 1 drop into both eyes 2 (two) times daily.     esomeprazole (NEXIUM) 20 MG capsule Take 20 mg by mouth daily.     ezetimibe (ZETIA) 10 MG tablet TAKE 1 TABLET BY MOUTH EVERY DAY 90 tablet 1   FLOMAX 0.4 MG CAPS capsule 0.4 mg at bedtime.     fluticasone (FLONASE) 50 MCG/ACT nasal spray SPRAY 2 SPRAYS INTO EACH NOSTRIL EVERY DAY (Patient taking differently: Place 1 spray into both nostrils daily as needed for allergies.) 48 mL 2  gabapentin (NEURONTIN) 300 MG capsule TAKE 1 CAPSULE BY MOUTH TWICE A DAY 60 capsule 6   gentamicin cream (GARAMYCIN) 0.1 % Apply 1 application topically every other day. Around port     insulin lispro (HUMALOG KWIKPEN) 100 UNIT/ML KwikPen INJECT 10-20 UNITS 3 TIMES A DAY WITH MEALS AS NEEDED 45 mL 1   Insulin NPH, Human,, Isophane, (HUMULIN N KWIKPEN) 100 UNIT/ML Kiwkpen Inject 65 Units into the skin daily. 60 mL 6   Insulin Pen Needle 32G X 4 MM MISC Inject 1 Device into the skin 5 (five) times daily. Use to inject Lantus and Novolog up to 5 times daily 500 each 3   lactulose (CHRONULAC) 10 GM/15ML solution TAKE 30 MLS (20 G TOTAL) BY MOUTH AS  NEEDED. 946 mL 0   latanoprost (XALATAN) 0.005 % ophthalmic solution Place 1 drop into both eyes 2 (two) times daily.     metoCLOPramide (REGLAN) 10 MG tablet TAKE 1 TABLET BY MOUTH EVERYDAY AT BEDTIME 30 tablet 2   midodrine (PROAMATINE) 5 MG tablet Take 5 mg by mouth 2 (two) times daily as needed (Below 100).     Multiple Vitamin (MULTIVITAMIN ADULT PO) Take 1 tablet by mouth daily.     ondansetron (ZOFRAN-ODT) 4 MG disintegrating tablet Take 4 mg by mouth every 8 (eight) hours as needed for nausea or vomiting.     prednisoLONE acetate (PRED FORTE) 1 % ophthalmic suspension Place 1 drop into the left eye 4 (four) times daily.     senna (SENOKOT) 8.6 MG tablet Take 2 tablets by mouth daily.     torsemide (DEMADEX) 20 MG tablet Take 20-40 mg by mouth See admin instructions. Take 40 mg in the morning and 20 mg in the afternoon     TRESIBA FLEXTOUCH 200 UNIT/ML FlexTouch Pen INJECT 40 UNITS INTO THE SKIN 2 (TWO) TIMES DAILY. 45 mL 2   fluconazole (DIFLUCAN) 100 MG tablet TAKE 1 TABLET (100 MG TOTAL) BY MOUTH ONCE A WEEK FOR 26 DOSES. (Patient not taking: Reported on 02/28/2022) 12 tablet 2   ketoconazole (NIZORAL) 2 % cream APPLY 1 APPLICATION TOPICALLY DAILY (Patient not taking: Reported on 02/28/2022) 60 g 2   PARoxetine (PAXIL) 10 MG tablet Take 10 mg by mouth daily.     No current facility-administered medications on file prior to visit.     ROS see history of present illness  Objective  Physical Exam Vitals:   02/28/22 1037 02/28/22 1103  BP: (!) 152/84 138/84  Pulse: 66   Temp: 97.9 F (36.6 C)   SpO2: 99%     BP Readings from Last 3 Encounters:  02/28/22 138/84  12/06/21 130/72  08/26/21 110/80   Wt Readings from Last 3 Encounters:  02/28/22 241 lb 9.6 oz (109.6 kg)  12/06/21 245 lb 3.2 oz (111.2 kg)  08/26/21 231 lb (104.8 kg)    Physical Exam Constitutional:      General: He is not in acute distress.    Appearance: He is not diaphoretic.  Cardiovascular:      Rate and Rhythm: Normal rate and regular rhythm.     Heart sounds: Normal heart sounds.  Pulmonary:     Effort: Pulmonary effort is normal.     Breath sounds: Normal breath sounds.  Abdominal:    Skin:    General: Skin is warm and dry.  Neurological:     Mental Status: He is alert.      Assessment/Plan: Please see individual problem list.  Problem List Items  Addressed This Visit     Hyperlipidemia (Chronic)    Continue Lipitor 80 mg daily.      Hypertension associated with diabetes ([place]) - Primary (Chronic)    Blood pressure is generally well controlled at home.  They will continue to monitor and follow with his nephrologist.      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  He will continue Tresiba 40 units twice daily, NPH 65 units daily, and sliding scale Humalog.  He will continue to see his endocrinologist as well.      CKD (chronic kidney disease), stage V ([place])    He will continue to follow with nephrology.      Hypogonadism in male    He will complete his work-up with endocrinology.      Trochanteric bursitis of right hip    Patient will proceed with PT as planned.        Health Maintenance: Prevnar 20 given today.  Return in about 6 months (around 08/29/2022).   Chase Rumps, MD [place]

## 2022-02-28 NOTE — Assessment & Plan Note (Signed)
Continue Lipitor 80 mg daily.

## 2022-03-18 ENCOUNTER — Telehealth: Payer: Self-pay | Admitting: Internal Medicine

## 2022-03-18 MED ORDER — TESTOSTERONE 1.62 % TD GEL: 1.0000 | Freq: Every day | TRANSDERMAL | 5 refills | Status: DC

## 2022-03-18 NOTE — Telephone Encounter (Signed)
Patient wife advised and will pick up medication

## 2022-03-18 NOTE — Telephone Encounter (Signed)
Please let the pt know that the MRI showed that this pituitary is normal    I have prescribed testosterone. Its in a pump   He needs to do one pump to one shoulder daily from now on    Thanks

## 2022-03-20 ENCOUNTER — Ambulatory Visit: Payer: Managed Care, Other (non HMO) | Admitting: Podiatry

## 2022-03-20 ENCOUNTER — Encounter: Payer: Self-pay | Admitting: Podiatry

## 2022-03-20 VITALS — BP 138/82

## 2022-03-20 DIAGNOSIS — S88112A Complete traumatic amputation at level between knee and ankle, left lower leg, initial encounter: Secondary | ICD-10-CM

## 2022-03-20 DIAGNOSIS — M79674 Pain in right toe(s): Secondary | ICD-10-CM

## 2022-03-20 DIAGNOSIS — R0989 Other specified symptoms and signs involving the circulatory and respiratory systems: Secondary | ICD-10-CM | POA: Diagnosis not present

## 2022-03-20 DIAGNOSIS — E1022 Type 1 diabetes mellitus with diabetic chronic kidney disease: Secondary | ICD-10-CM | POA: Diagnosis not present

## 2022-03-20 DIAGNOSIS — B351 Tinea unguium: Secondary | ICD-10-CM

## 2022-03-20 DIAGNOSIS — M79675 Pain in left toe(s): Secondary | ICD-10-CM

## 2022-03-20 DIAGNOSIS — N185 Chronic kidney disease, stage 5: Secondary | ICD-10-CM

## 2022-03-20 DIAGNOSIS — N186 End stage renal disease: Secondary | ICD-10-CM

## 2022-03-20 DIAGNOSIS — Z992 Dependence on renal dialysis: Secondary | ICD-10-CM

## 2022-03-20 NOTE — Progress Notes (Signed)
This patient returns to my office for at risk foot care.  This patient requires this care by a professional since this patient will be at risk due to having diabetic neuropathy with amputation left leg/foot.  This patient is unable to cut nails himself since the patient cannot reach his nails.These nails are painful walking and wearing shoes.  This patient presents for at risk foot care today.  General Appearance  Alert, conversant and in no acute stress.  Vascular  Dorsalis pedis and posterior tibial  pulses are not  palpable  right .  Capillary return is within normal limits  right. Temperature is within normal limits  right  Neurologic  Senn-Weinstein monofilament wire test absent right. Muscle power within normal limits right  Nails Thick disfigured discolored nails with subungual debris  from hallux to fifth toes right . No evidence of bacterial infection or drainage bilaterally.  Orthopedic  No limitations of motion  feet .  No crepitus or effusions noted.  No bony pathology or digital deformities noted.  Skin  normotropic skin with no porokeratosis noted bilaterally.  No signs of infections or ulcers noted.     Onychomycosis  Pain in right toes    Consent was obtained for treatment procedures.   Mechanical debridement of nails 1-5  right performed with a nail nipper.  Filed with dremel without incident.    Return office visit    3 months                 Told patient to return for periodic foot care and evaluation due to potential at risk complications.   Gardiner Barefoot DPM

## 2022-04-08 ENCOUNTER — Other Ambulatory Visit: Payer: Self-pay | Admitting: Internal Medicine

## 2022-04-10 ENCOUNTER — Encounter: Payer: Self-pay | Admitting: Internal Medicine

## 2022-04-10 ENCOUNTER — Telehealth (INDEPENDENT_AMBULATORY_CARE_PROVIDER_SITE_OTHER): Payer: Managed Care, Other (non HMO) | Admitting: Internal Medicine

## 2022-04-10 VITALS — Ht 70.0 in

## 2022-04-10 DIAGNOSIS — N186 End stage renal disease: Secondary | ICD-10-CM

## 2022-04-10 DIAGNOSIS — E230 Hypopituitarism: Secondary | ICD-10-CM

## 2022-04-10 DIAGNOSIS — E103593 Type 1 diabetes mellitus with proliferative diabetic retinopathy without macular edema, bilateral: Secondary | ICD-10-CM

## 2022-04-10 DIAGNOSIS — E1022 Type 1 diabetes mellitus with diabetic chronic kidney disease: Secondary | ICD-10-CM

## 2022-04-10 DIAGNOSIS — E1059 Type 1 diabetes mellitus with other circulatory complications: Secondary | ICD-10-CM

## 2022-04-10 DIAGNOSIS — Z992 Dependence on renal dialysis: Secondary | ICD-10-CM

## 2022-04-10 DIAGNOSIS — E1042 Type 1 diabetes mellitus with diabetic polyneuropathy: Secondary | ICD-10-CM

## 2022-04-10 NOTE — Progress Notes (Signed)
Virtual Visit via Video Note  I connected with Chase Clark on 04/10/22 at 11:30 AM by a video enabled telemedicine application and verified that I am speaking with the correct person using two identifiers.   I discussed the limitations of evaluation and management by telemedicine and the availability of in person appointments. The patient expressed understanding and agreed to proceed.   -Location of the patient : home -Location of the provider : Office -The names of all persons participating in the telemedicine service : Pt, spouse  and myself          Name: Chase Clark  Age/ Sex: 56 y.o., male   MRN/ DOB: [id number], Aug 29, 1965     PCP: Chase Haven, MD   Reason for Endocrinology Evaluation: Type 1 Diabetes Mellitus  Initial Endocrine Consultative Visit: 06/29/2019    PATIENT IDENTIFIER: [name] is a 56 y.o. 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 The patient has followed with Endocrinology clinic since 06/29/2019 for consultative assistance with management of his diabetes.  DIABETIC HISTORY:  Chase Clark was diagnosed with  T1DM in 1990, was initially diagnosed as type 2 until years ago when he was diagnosed with T1DM, has been on insulin for ~ 15 yrs. He tried Ozempic 04/2019 with GI intolerance. His hemoglobin A1c has ranged from 5.9%  in 2021, peaking at 15.3%in 2019.    HYPOGONADISM HISTORY: He was noted with low testosterone levels (239.76 NG/DL in July 2023) after presenting to his PCP with gynecomastia, and slight elevation of the prolactin at 32.6 NG/mL with inappropriately normal LH at 7.37mU/mL .  Mammogram 10/16/2021 was unrevealing except for bilateral gynecomastia and no malignancy  Patient has chronic erectile dysfunction for years No personal history of DVT/PE or prostate cancer  [facility]   Pituitary MRI did not reveal any adenoma   Topical testosterone December 2023   SUBJECTIVE:   During the last visit (12/06/2021): A1c 6.6 %.      Today (04/10/2022): [name] here for a follow up on diabetes management.  He is accompanied by his wife TBaxter Flatterytoday.He checks his glucose multiple times a day through the dexcom   [facility]  He was followed by podiatry on 03/20/2022   Follows with Dr. PHilarie Fredricksonfor colonic adenomas and elevated LFT's   Has chronic loose stools Had a transient episode of nausea but no vomiting  He had an exercise stress test that he did not pass, pending another evaluation in March .  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, + 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  Last Eye Exam: Completed 04/2021   Macrovascular  complications:  CAD (S/P CABG 01/2019)  Denies: CVA, PVD   HISTORY:  Past Medical History:  Past Medical History:  Diagnosis Date   Anemia    Arthritis    Asthma    as child   Barrett's esophagus 02/16/2014   short segment   Cataract    Chronic kidney disease (CKD), stage IV (severe) (HCC)    Coronary artery disease    DDD (degenerative disc disease), cervical    Diabetes mellitus without complication (HCC)    Diabetic osteomyelitis ([place])    Diabetic peripheral neuropathy (HCC)    Diabetic ulcer of  foot with muscle involvement without evidence of necrosis ([place])    DIABETIC ULCERATIONS ASSOCIATED WITH IRRITATION LATERAL ANKLE LEFT GREATER THAN RIGHT WITH MILD CELLULITIS    Diverticulosis    DKA (diabetic ketoacidoses) 08/23/2017   Edema, lower extremity    Fatty liver    Gastroparesis 2015   GERD (gastroesophageal reflux disease)    Gilbert's disease    Glaucoma    Hx of BKA, left (HCC)    Hx of CABG 01/27/2019   2 vessels; LIMA-LAD, SVG-D1   Hx of heart artery stent 11/20/2009   80% D1 stenosis - 2.5 x 18 mm Xience V Everolimus (DES) stent x 1 placed   Hyperlipidemia    Hypertension    Left below-knee amputee ([place])    Legally blind    Myocardial infarction ([place]) 11/2009   Neuropathy    Osteoarthritis of spine    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   PONV (postoperative nausea and vomiting)    PVD (peripheral vascular disease) (HCC)    Seasonal allergies    Shoulder pain    Tubular adenoma of colon    Ulcer of foot ([place]) 11/17/2012   DIABETIC ULCERATIONS ASSOCIATED WITH IRRITATION LATERAL ANKLE LEFT GREATER THAN RIGHT WITH MILD CELLULITIS   Past Surgical History:  Past Surgical History:  Procedure Laterality Date   ABDOMINAL AORTOGRAM N/A 05/20/2016   Procedure: Abdominal Aortogram possible intervention;  Surgeon: Katha Cabal, MD;  Location: [place] [facility];  Service: Cardiovascular;  Laterality: N/A;   AMPUTATION Left 09/05/2017   Procedure: AMPUTATION BELOW KNEE;  Surgeon: Tania Ade, MD;  Location: [place];  Service: Orthopedics;  Laterality: Left;   ANTERIOR CERVICAL DECOMP/DISCECTOMY FUSION N/A 05/07/2017   Procedure: ANTERIOR CERVICAL DECOMPRESSION/DISCECTOMY Luevenia Maxin PROSTHESIS,PLATE/SCREWS CERVICAL FIVE - CERVICAL SIX;  Surgeon: Newman Pies, MD;  Location: [place];  Service: Neurosurgery;  Laterality: N/A;   CAPD INSERTION N/A 08/30/2020   Procedure: LAPAROSCOPIC  INSERTION CONTINUOUS AMBULATORY PERITONEAL DIALYSIS  (CAPD) CATHETER;  Surgeon: Olean Ree, MD;  Location: [facility];  Service: General;  Laterality: N/A;   CATARACT EXTRACTION W/ INTRAOCULAR LENS IMPLANT     CATARACT EXTRACTION W/PHACO Left 02/26/2017   Procedure: CATARACT EXTRACTION PHACO AND INTRAOCULAR LENS PLACEMENT ([place]);  Surgeon: Eulogio Bear, MD;  Location: [facility];  Service: Ophthalmology;  Laterality: Left;  Lot # [id number] H [place]: 00:25.3 AP%: 6.2 CDE: 1.59    CHOLECYSTECTOMY N/A    COLONOSCOPY WITH PROPOFOL N/A 05/30/2021   Procedure: COLONOSCOPY WITH PROPOFOL;  Surgeon: Jerene Bears, MD;  [facility];  Service: Gastroenterology;  Laterality: N/A;   CORONARY ANGIOPLASTY WITH STENT PLACEMENT  11/20/2009   80% D1 stenosis - 2.5 x 18 mm Xience V Everolimus (DES) stent x 1 placed; LOCATION: [facility]; SURGEON: Bartholome Bill, MD   CORONARY  ARTERY BYPASS GRAFT N/A 01/27/2019   Procedure: OFF PUMP CORONARY ARTERY BYPASS GRAFTING (CABG) X 2 WITH ENDOSCOPIC HARVESTING OF RIGHT GREATER SAPHENOUS VEIN. LIMA TO LAD;  Surgeon: Lajuana Matte, MD;  Location: [place];  Service: Open Heart Surgery;  Laterality: N/A;   CORONARY STENT INTERVENTION N/A 01/21/2019   Procedure: CORONARY STENT INTERVENTION;  Surgeon: Belva Crome, MD;  Location: [place] [facility];  Service: Cardiovascular;  Laterality: N/A;   EPIBLEPHERON REPAIR WITH TEAR DUCT PROBING     EYE SURGERY Right 02/09/2014   cataract extraction   INSERTION EXPRESS TUBE SHUNT Right 09/06/2015   Procedure: INSERTION AHMED TUBE SHUNT with tutoplast allograft;  Surgeon: Eulogio Bear, MD;  Location: [facility];  Service: Ophthalmology;  Laterality: Right;   laparoscopic insertion continuous peritoneal dialysis  2022   LEFT HEART CATH AND CORONARY ANGIOGRAPHY N/A 01/20/2019   Procedure: LEFT HEART CATH AND CORONARY ANGIOGRAPHY;  Surgeon: Lorretta Harp, MD;  Location: [place] [facility];  Service: Cardiovascular;   Laterality: N/A;   POLYPECTOMY  05/30/2021   Procedure: POLYPECTOMY;  Surgeon: Jerene Bears, MD;  [facility];  Service: Gastroenterology;;   TEE WITHOUT CARDIOVERSION N/A 01/27/2019   Procedure: TRANSESOPHAGEAL ECHOCARDIOGRAM (TEE);  Surgeon: Lajuana Matte, MD;  Location: [place];  Service: Open Heart Surgery;  Laterality: N/A;   VASECTOMY     Social History:  reports that he quit smoking about 14 years ago. His smoking use included cigarettes. He has a 10.00 pack-year smoking history. He has never used smokeless tobacco. He reports that he does not currently use alcohol. He reports that he does not use drugs. Family History:  Family History  Problem Relation Age of Onset   Hyperlipidemia Mother    Diabetes Mother    Liver disease Mother        NASH   Other Mother        immune thrombocytopenic pupura (ITP)   Hyperlipidemia Father    Diabetes Father    Heart disease Father    Hypertension Father    Lymphoma Father    Hemochromatosis Other        Grandmother (? which side)   Polycythemia Other    Colon cancer Neg Hx    Esophageal cancer Neg Hx    Rectal cancer Neg Hx    Stomach cancer Neg Hx      HOME MEDICATIONS: Allergies as of 04/10/2022   No Known Allergies      Medication List        Accurate as of April 10, 2022  3:54 PM. If you have any questions, ask your nurse or doctor.          acetaminophen 325 MG tablet Commonly known as: TYLENOL Take 1 tablet (325 mg total) by mouth every 6 (six) hours as needed for mild pain (pain score 1-3 or temp > 100.5).   aspirin 81 MG chewable tablet Commonly known as: Aspirin Childrens Chew 1 tablet (81 mg total) by mouth daily.   atorvastatin 80 MG tablet Commonly known as: LIPITOR TAKE 1 TABLET BY MOUTH EVERY DAY What changed: when to take this   brimonidine-timolol 0.2-0.5 % ophthalmic solution Commonly known as: COMBIGAN Place 1 drop into both eyes every 12 (twelve) hours.   Dexcom G6  Transmitter Misc Inject 1 Device into the skin as directed. Use to check blood sugar at least 4 times daily   Dexcom G7 Sensor Misc 1 Device by Does not apply route  as directed.   dorzolamide 2 % ophthalmic solution Commonly known as: TRUSOPT Place 1 drop into both eyes 2 (two) times daily.   esomeprazole 20 MG capsule Commonly known as: NEXIUM Take 20 mg by mouth daily.   ezetimibe 10 MG tablet Commonly known as: ZETIA TAKE 1 TABLET BY MOUTH EVERY DAY   Flomax 0.4 MG Caps capsule Generic drug: tamsulosin 0.4 mg at bedtime.   fluconazole 100 MG tablet Commonly known as: DIFLUCAN TAKE 1 TABLET (100 MG TOTAL) BY MOUTH ONCE A WEEK FOR 26 DOSES.   fluticasone 50 MCG/ACT nasal spray Commonly known as: FLONASE SPRAY 2 SPRAYS INTO EACH NOSTRIL EVERY DAY What changed: See the new instructions.   gabapentin 300 MG capsule Commonly known as: NEURONTIN TAKE 1 CAPSULE BY MOUTH TWICE A DAY   gentamicin cream 0.1 % Commonly known as: GARAMYCIN Apply 1 application topically every other day. Around port   HumuLIN N KwikPen 100 UNIT/ML Kiwkpen Generic drug: Insulin NPH (Human) (Isophane) Inject 65 Units into the skin daily.   insulin lispro 100 UNIT/ML KwikPen Commonly known as: HumaLOG KwikPen INJECT 10-20 UNITS 3 TIMES A DAY WITH MEALS AS NEEDED   Insulin Pen Needle 32G X 4 MM Misc Inject 1 Device into the skin 5 (five) times daily. Use to inject Lantus and Novolog up to 5 times daily   ketoconazole 2 % cream Commonly known as: NIZORAL APPLY 1 APPLICATION TOPICALLY DAILY   lactulose 10 GM/15ML solution Commonly known as: CHRONULAC TAKE 30 MLS (20 G TOTAL) BY MOUTH AS NEEDED.   latanoprost 0.005 % ophthalmic solution Commonly known as: XALATAN Place 1 drop into both eyes 2 (two) times daily.   LUBRICANT EYE DROPS OP Place 1 drop into both eyes 2 (two) times daily.   metoCLOPramide 10 MG tablet Commonly known as: REGLAN TAKE 1 TABLET BY MOUTH EVERYDAY AT BEDTIME    midodrine 5 MG tablet Commonly known as: PROAMATINE Take 5 mg by mouth 2 (two) times daily as needed (Below 100).   MULTIVITAMIN ADULT PO Take 1 tablet by mouth daily.   ondansetron 4 MG disintegrating tablet Commonly known as: ZOFRAN-ODT Take 4 mg by mouth every 8 (eight) hours as needed for nausea or vomiting.   PARoxetine 10 MG tablet Commonly known as: PAXIL Take 10 mg by mouth daily.   prednisoLONE acetate 1 % ophthalmic suspension Commonly known as: PRED FORTE Place 1 drop into the left eye 4 (four) times daily.   senna 8.6 MG tablet Commonly known as: SENOKOT Take 2 tablets by mouth daily.   Testosterone 1.62 % Gel Place 1 Pump onto the skin daily in the afternoon.   torsemide 20 MG tablet Commonly known as: DEMADEX Take 20-40 mg by mouth See admin instructions. Take 40 mg in the morning and 20 mg in the afternoon   Tresiba FlexTouch 200 UNIT/ML FlexTouch Pen Generic drug: insulin degludec INJECT 40 UNITS INTO THE SKIN 2 (TWO) TIMES DAILY.   Vitamin D 50 MCG (2000 UT) Caps Take 2,000 Units by mouth daily.         OBJECTIVE:   Vital Signs: Ht '5\' 10"'$  (1.778 m)   BMI 34.67 kg/m   Wt Readings from Last 3 Encounters:  02/28/22 241 lb 9.6 oz (109.6 kg)  12/06/21 245 lb 3.2 oz (111.2 kg)  08/26/21 231 lb (104.8 kg)     Exam: General: Pt appears well and is in NAD  Neuro: MS is good with appropriate affect, pt is alert and Ox3  DATA REVIEWED:  Lab Results  Component Value Date   HGBA1C 6.9 (A) 12/06/2021   HGBA1C 6.6 (A) 11/15/2020   HGBA1C 7.0 (H) 06/05/2020    09/13/2021 '@Care'$  Everywhere A1c 6.9%   ASSESSMENT / PLAN / RECOMMENDATIONS:   1)Type 1 Diabetes Mellitus, optimally controlled, With ESRD on PD Hx of left BKA, retinopathy and macrovascular complications - Most recent A1c of 6.9 %. Goal A1c < 7.5 %.    - His BG's in the morning have been in 170's . He did have an episode of a BG 80 mg/dL after breakfast with taking 10 units  but he attributes this to insulin-carb mismatch  - He is pending evaluation for a renal transplant, I am wondering if he would qualify for a pancreatic transplant as well, he will check with transplant team   - No changes at this time  MEDICATIONS: -Continue Tresiba 40  units twice a day  -Continue Humalog 10-15 units with each meal  -Continue  NPH 65 units at the beginning of dialysis     EDUCATION / INSTRUCTIONS: BG monitoring instructions: Patient is instructed to check his blood sugars 4 times a day, before meals and bedtime  Call [place] [facility] if: BG persistently < 70  I reviewed the Rule of 15 for the treatment of hypoglycemia in detail with the patient. Literature supplied.   2) Diabetic complications:  Eye: Does  have known diabetic retinopathy.  Neuro/ Feet: Does have known diabetic peripheral neuropathy .  Renal: Patient does  have known baseline CKD. He   is on peritoneal dialysis    3) Hypogonadism:   -Patient with erectile dysfunction, I did explain to him that this is most likely multifactorial given low testosterone, neuropathy, and ESRD  -Repeat testosterone was low at 176 - Pituitary MRI was negative  - Started testosterone recently with no rash thus far -PSA and CBC are normal  Medication  Continue Testosterone gel 1 pump daily    F/U in 6 months    Signed electronically by: Mack Guise, MD  [facility]  [facility] [address] [place], [place] [address] Phone: [phone] FAX: [phone]   CC: Chase Haven, MD [address] [place] [address] Phone: [phone]  Fax: [phone]  Return to Endocrinology clinic as below: Future Appointments  Date Time Provider [place]  07/03/2022  4:00 PM Gardiner Barefoot, [place] [facility]  08/29/2022  9:30 AM [name], [name], MD [facility]

## 2022-04-24 ENCOUNTER — Other Ambulatory Visit: Payer: Self-pay | Admitting: Internal Medicine

## 2022-04-25 ENCOUNTER — Other Ambulatory Visit: Payer: Self-pay | Admitting: Internal Medicine

## 2022-04-25 NOTE — Telephone Encounter (Signed)
Patient spouse states that  insurance will no longer cover Humulin N and Humalog. They will cover Novolin N  and Novolog.

## 2022-05-02 ENCOUNTER — Other Ambulatory Visit: Payer: Self-pay

## 2022-05-02 DIAGNOSIS — E103593 Type 1 diabetes mellitus with proliferative diabetic retinopathy without macular edema, bilateral: Secondary | ICD-10-CM

## 2022-05-02 MED ORDER — INSULIN LISPRO (1 UNIT DIAL) 100 UNIT/ML (KWIKPEN): PEN_INJECTOR | SUBCUTANEOUS | 1 refills | Status: DC

## 2022-05-08 ENCOUNTER — Telehealth: Payer: Self-pay

## 2022-05-08 MED ORDER — INSULIN NPH (HUMAN) (ISOPHANE) 100 UNIT/ML ~~LOC~~ SUSP: 65.0000 [IU] | Freq: Every day | SUBCUTANEOUS | 3 refills | Status: DC

## 2022-05-08 MED ORDER — "INSULIN SYRINGE-NEEDLE U-100 28G X 1/2"" 1 ML MISC": 1.0000 | Freq: Every day | 3 refills | Status: DC

## 2022-05-08 MED ORDER — NOVOLOG FLEXPEN 100 UNIT/ML ~~LOC~~ SOPN: PEN_INJECTOR | SUBCUTANEOUS | 3 refills | Status: DC

## 2022-05-08 NOTE — Telephone Encounter (Signed)
Patient needs to have syringe and pen needles sent to pharmacy for the vials.

## 2022-05-08 NOTE — Telephone Encounter (Signed)
Done

## 2022-05-08 NOTE — Telephone Encounter (Signed)
Patient wife advised and verbalized understanding.  She will use the vials

## 2022-05-08 NOTE — Telephone Encounter (Signed)
Patient spouse states that patient needs Novolin in the pens because he counts the clicks . Also  need the Humalog changed to Novolog.  Would like to know what improvements should patient be seeing from taking the testosterone.

## 2022-05-29 ENCOUNTER — Other Ambulatory Visit: Payer: Self-pay | Admitting: Internal Medicine

## 2022-06-24 ENCOUNTER — Other Ambulatory Visit: Payer: Self-pay | Admitting: Internal Medicine

## 2022-06-29 ENCOUNTER — Other Ambulatory Visit: Payer: Self-pay | Admitting: Internal Medicine

## 2022-06-29 ENCOUNTER — Other Ambulatory Visit: Payer: Self-pay | Admitting: Family Medicine

## 2022-07-03 ENCOUNTER — Ambulatory Visit: Payer: Managed Care, Other (non HMO) | Admitting: Podiatry

## 2022-07-03 ENCOUNTER — Encounter: Payer: Self-pay | Admitting: Podiatry

## 2022-07-03 VITALS — BP 177/88 | HR 69

## 2022-07-03 DIAGNOSIS — N186 End stage renal disease: Secondary | ICD-10-CM

## 2022-07-03 DIAGNOSIS — Z992 Dependence on renal dialysis: Secondary | ICD-10-CM

## 2022-07-03 DIAGNOSIS — B351 Tinea unguium: Secondary | ICD-10-CM

## 2022-07-03 DIAGNOSIS — S88112A Complete traumatic amputation at level between knee and ankle, left lower leg, initial encounter: Secondary | ICD-10-CM

## 2022-07-03 DIAGNOSIS — M79674 Pain in right toe(s): Secondary | ICD-10-CM

## 2022-07-03 DIAGNOSIS — M79675 Pain in left toe(s): Secondary | ICD-10-CM

## 2022-07-03 DIAGNOSIS — E1022 Type 1 diabetes mellitus with diabetic chronic kidney disease: Secondary | ICD-10-CM | POA: Diagnosis not present

## 2022-07-03 NOTE — Progress Notes (Signed)
This patient returns to my office for at risk foot care.  This patient requires this care by a professional since this patient will be at risk due to having diabetic neuropathy with amputation left leg/foot.  This patient is unable to cut nails himself since the patient cannot reach his nails.These nails are painful walking and wearing shoes.  This patient presents for at risk foot care today.  General Appearance  Alert, conversant and in no acute stress.  Vascular  Dorsalis pedis and posterior tibial  pulses are not  palpable  right .  Capillary return is within normal limits  right. Temperature is within normal limits  right  Neurologic  Senn-Weinstein monofilament wire test absent right. Muscle power within normal limits right  Nails Thick disfigured discolored nails with subungual debris  from hallux to fifth toes right . No evidence of bacterial infection or drainage bilaterally.  Orthopedic  No limitations of motion  feet .  No crepitus or effusions noted.  No bony pathology or digital deformities noted.  Skin  normotropic skin with no porokeratosis noted bilaterally.  No signs of infections or ulcers noted.     Onychomycosis  Pain in right toes    Consent was obtained for treatment procedures.   Mechanical debridement of nails 1-5  right performed with a nail nipper.  Filed with dremel without incident.    Return office visit    3 months                 Told patient to return for periodic foot care and evaluation due to potential at risk complications.   Lezly Rumpf DPM   

## 2022-07-14 ENCOUNTER — Other Ambulatory Visit: Payer: Self-pay | Admitting: Internal Medicine

## 2022-07-14 DIAGNOSIS — Z992 Dependence on renal dialysis: Secondary | ICD-10-CM | POA: Diagnosis not present

## 2022-07-14 DIAGNOSIS — N186 End stage renal disease: Secondary | ICD-10-CM | POA: Diagnosis not present

## 2022-07-15 DIAGNOSIS — N186 End stage renal disease: Secondary | ICD-10-CM | POA: Diagnosis not present

## 2022-07-15 DIAGNOSIS — Z992 Dependence on renal dialysis: Secondary | ICD-10-CM | POA: Diagnosis not present

## 2022-07-16 DIAGNOSIS — Z992 Dependence on renal dialysis: Secondary | ICD-10-CM | POA: Diagnosis not present

## 2022-07-16 DIAGNOSIS — N186 End stage renal disease: Secondary | ICD-10-CM | POA: Diagnosis not present

## 2022-07-17 DIAGNOSIS — Z992 Dependence on renal dialysis: Secondary | ICD-10-CM | POA: Diagnosis not present

## 2022-07-17 DIAGNOSIS — N186 End stage renal disease: Secondary | ICD-10-CM | POA: Diagnosis not present

## 2022-07-18 DIAGNOSIS — Z992 Dependence on renal dialysis: Secondary | ICD-10-CM | POA: Diagnosis not present

## 2022-07-18 DIAGNOSIS — N186 End stage renal disease: Secondary | ICD-10-CM | POA: Diagnosis not present

## 2022-07-19 DIAGNOSIS — N186 End stage renal disease: Secondary | ICD-10-CM | POA: Diagnosis not present

## 2022-07-19 DIAGNOSIS — Z992 Dependence on renal dialysis: Secondary | ICD-10-CM | POA: Diagnosis not present

## 2022-07-20 DIAGNOSIS — N186 End stage renal disease: Secondary | ICD-10-CM | POA: Diagnosis not present

## 2022-07-20 DIAGNOSIS — Z992 Dependence on renal dialysis: Secondary | ICD-10-CM | POA: Diagnosis not present

## 2022-07-21 DIAGNOSIS — Z992 Dependence on renal dialysis: Secondary | ICD-10-CM | POA: Diagnosis not present

## 2022-07-21 DIAGNOSIS — N186 End stage renal disease: Secondary | ICD-10-CM | POA: Diagnosis not present

## 2022-07-22 DIAGNOSIS — N186 End stage renal disease: Secondary | ICD-10-CM | POA: Diagnosis not present

## 2022-07-22 DIAGNOSIS — Z992 Dependence on renal dialysis: Secondary | ICD-10-CM | POA: Diagnosis not present

## 2022-07-23 DIAGNOSIS — N186 End stage renal disease: Secondary | ICD-10-CM | POA: Diagnosis not present

## 2022-07-23 DIAGNOSIS — Z992 Dependence on renal dialysis: Secondary | ICD-10-CM | POA: Diagnosis not present

## 2022-07-24 DIAGNOSIS — Z992 Dependence on renal dialysis: Secondary | ICD-10-CM | POA: Diagnosis not present

## 2022-07-24 DIAGNOSIS — N186 End stage renal disease: Secondary | ICD-10-CM | POA: Diagnosis not present

## 2022-07-25 DIAGNOSIS — N186 End stage renal disease: Secondary | ICD-10-CM | POA: Diagnosis not present

## 2022-07-25 DIAGNOSIS — Z992 Dependence on renal dialysis: Secondary | ICD-10-CM | POA: Diagnosis not present

## 2022-07-26 DIAGNOSIS — N186 End stage renal disease: Secondary | ICD-10-CM | POA: Diagnosis not present

## 2022-07-26 DIAGNOSIS — Z992 Dependence on renal dialysis: Secondary | ICD-10-CM | POA: Diagnosis not present

## 2022-07-27 DIAGNOSIS — Z992 Dependence on renal dialysis: Secondary | ICD-10-CM | POA: Diagnosis not present

## 2022-07-27 DIAGNOSIS — N186 End stage renal disease: Secondary | ICD-10-CM | POA: Diagnosis not present

## 2022-07-28 DIAGNOSIS — Z992 Dependence on renal dialysis: Secondary | ICD-10-CM | POA: Diagnosis not present

## 2022-07-28 DIAGNOSIS — N186 End stage renal disease: Secondary | ICD-10-CM | POA: Diagnosis not present

## 2022-07-29 DIAGNOSIS — N186 End stage renal disease: Secondary | ICD-10-CM | POA: Diagnosis not present

## 2022-07-29 DIAGNOSIS — Z992 Dependence on renal dialysis: Secondary | ICD-10-CM | POA: Diagnosis not present

## 2022-07-30 DIAGNOSIS — Z794 Long term (current) use of insulin: Secondary | ICD-10-CM | POA: Diagnosis not present

## 2022-07-30 DIAGNOSIS — E119 Type 2 diabetes mellitus without complications: Secondary | ICD-10-CM | POA: Diagnosis not present

## 2022-07-30 DIAGNOSIS — Z992 Dependence on renal dialysis: Secondary | ICD-10-CM | POA: Diagnosis not present

## 2022-07-30 DIAGNOSIS — E113513 Type 2 diabetes mellitus with proliferative diabetic retinopathy with macular edema, bilateral: Secondary | ICD-10-CM | POA: Diagnosis not present

## 2022-07-30 DIAGNOSIS — N186 End stage renal disease: Secondary | ICD-10-CM | POA: Diagnosis not present

## 2022-07-31 DIAGNOSIS — N186 End stage renal disease: Secondary | ICD-10-CM | POA: Diagnosis not present

## 2022-07-31 DIAGNOSIS — Z992 Dependence on renal dialysis: Secondary | ICD-10-CM | POA: Diagnosis not present

## 2022-08-01 DIAGNOSIS — N186 End stage renal disease: Secondary | ICD-10-CM | POA: Diagnosis not present

## 2022-08-01 DIAGNOSIS — Z992 Dependence on renal dialysis: Secondary | ICD-10-CM | POA: Diagnosis not present

## 2022-08-02 DIAGNOSIS — Z992 Dependence on renal dialysis: Secondary | ICD-10-CM | POA: Diagnosis not present

## 2022-08-02 DIAGNOSIS — N186 End stage renal disease: Secondary | ICD-10-CM | POA: Diagnosis not present

## 2022-08-03 DIAGNOSIS — Z992 Dependence on renal dialysis: Secondary | ICD-10-CM | POA: Diagnosis not present

## 2022-08-03 DIAGNOSIS — N186 End stage renal disease: Secondary | ICD-10-CM | POA: Diagnosis not present

## 2022-08-04 DIAGNOSIS — N186 End stage renal disease: Secondary | ICD-10-CM | POA: Diagnosis not present

## 2022-08-04 DIAGNOSIS — Z992 Dependence on renal dialysis: Secondary | ICD-10-CM | POA: Diagnosis not present

## 2022-08-05 DIAGNOSIS — Z992 Dependence on renal dialysis: Secondary | ICD-10-CM | POA: Diagnosis not present

## 2022-08-05 DIAGNOSIS — N186 End stage renal disease: Secondary | ICD-10-CM | POA: Diagnosis not present

## 2022-08-06 DIAGNOSIS — Z992 Dependence on renal dialysis: Secondary | ICD-10-CM | POA: Diagnosis not present

## 2022-08-06 DIAGNOSIS — N186 End stage renal disease: Secondary | ICD-10-CM | POA: Diagnosis not present

## 2022-08-07 DIAGNOSIS — N186 End stage renal disease: Secondary | ICD-10-CM | POA: Diagnosis not present

## 2022-08-07 DIAGNOSIS — Z992 Dependence on renal dialysis: Secondary | ICD-10-CM | POA: Diagnosis not present

## 2022-08-08 ENCOUNTER — Other Ambulatory Visit: Payer: Self-pay | Admitting: Internal Medicine

## 2022-08-08 DIAGNOSIS — N186 End stage renal disease: Secondary | ICD-10-CM | POA: Diagnosis not present

## 2022-08-08 DIAGNOSIS — Z992 Dependence on renal dialysis: Secondary | ICD-10-CM | POA: Diagnosis not present

## 2022-08-09 DIAGNOSIS — Z992 Dependence on renal dialysis: Secondary | ICD-10-CM | POA: Diagnosis not present

## 2022-08-09 DIAGNOSIS — N186 End stage renal disease: Secondary | ICD-10-CM | POA: Diagnosis not present

## 2022-08-10 DIAGNOSIS — Z992 Dependence on renal dialysis: Secondary | ICD-10-CM | POA: Diagnosis not present

## 2022-08-10 DIAGNOSIS — N186 End stage renal disease: Secondary | ICD-10-CM | POA: Diagnosis not present

## 2022-08-11 DIAGNOSIS — N186 End stage renal disease: Secondary | ICD-10-CM | POA: Diagnosis not present

## 2022-08-11 DIAGNOSIS — Z992 Dependence on renal dialysis: Secondary | ICD-10-CM | POA: Diagnosis not present

## 2022-08-12 DIAGNOSIS — Z992 Dependence on renal dialysis: Secondary | ICD-10-CM | POA: Diagnosis not present

## 2022-08-12 DIAGNOSIS — N186 End stage renal disease: Secondary | ICD-10-CM | POA: Diagnosis not present

## 2022-08-13 ENCOUNTER — Ambulatory Visit: Payer: Managed Care, Other (non HMO) | Admitting: Internal Medicine

## 2022-08-13 DIAGNOSIS — Z992 Dependence on renal dialysis: Secondary | ICD-10-CM | POA: Diagnosis not present

## 2022-08-13 DIAGNOSIS — N186 End stage renal disease: Secondary | ICD-10-CM | POA: Diagnosis not present

## 2022-08-14 DIAGNOSIS — N186 End stage renal disease: Secondary | ICD-10-CM | POA: Diagnosis not present

## 2022-08-14 DIAGNOSIS — Z992 Dependence on renal dialysis: Secondary | ICD-10-CM | POA: Diagnosis not present

## 2022-08-15 DIAGNOSIS — Z992 Dependence on renal dialysis: Secondary | ICD-10-CM | POA: Diagnosis not present

## 2022-08-15 DIAGNOSIS — N186 End stage renal disease: Secondary | ICD-10-CM | POA: Diagnosis not present

## 2022-08-16 DIAGNOSIS — N186 End stage renal disease: Secondary | ICD-10-CM | POA: Diagnosis not present

## 2022-08-16 DIAGNOSIS — Z992 Dependence on renal dialysis: Secondary | ICD-10-CM | POA: Diagnosis not present

## 2022-08-17 DIAGNOSIS — N186 End stage renal disease: Secondary | ICD-10-CM | POA: Diagnosis not present

## 2022-08-17 DIAGNOSIS — Z992 Dependence on renal dialysis: Secondary | ICD-10-CM | POA: Diagnosis not present

## 2022-08-18 DIAGNOSIS — N186 End stage renal disease: Secondary | ICD-10-CM | POA: Diagnosis not present

## 2022-08-18 DIAGNOSIS — Z992 Dependence on renal dialysis: Secondary | ICD-10-CM | POA: Diagnosis not present

## 2022-08-19 ENCOUNTER — Other Ambulatory Visit (HOSPITAL_COMMUNITY): Payer: Self-pay

## 2022-08-19 ENCOUNTER — Other Ambulatory Visit: Payer: Self-pay | Admitting: Internal Medicine

## 2022-08-19 ENCOUNTER — Telehealth: Payer: Self-pay

## 2022-08-19 DIAGNOSIS — Z992 Dependence on renal dialysis: Secondary | ICD-10-CM | POA: Diagnosis not present

## 2022-08-19 DIAGNOSIS — N186 End stage renal disease: Secondary | ICD-10-CM | POA: Diagnosis not present

## 2022-08-19 NOTE — Telephone Encounter (Signed)
Novolog needs a PA  °

## 2022-08-19 NOTE — Telephone Encounter (Signed)
Does pt have new insurance?  Test claim shows that his previous coverage terminated on 04/13/22

## 2022-08-20 ENCOUNTER — Other Ambulatory Visit (HOSPITAL_COMMUNITY): Payer: Self-pay

## 2022-08-20 DIAGNOSIS — N186 End stage renal disease: Secondary | ICD-10-CM | POA: Diagnosis not present

## 2022-08-20 DIAGNOSIS — Z992 Dependence on renal dialysis: Secondary | ICD-10-CM | POA: Diagnosis not present

## 2022-08-20 NOTE — Telephone Encounter (Signed)
Called and left a HIPAA compliant vm

## 2022-08-20 NOTE — Telephone Encounter (Signed)
Can you contact patient?

## 2022-08-21 DIAGNOSIS — N186 End stage renal disease: Secondary | ICD-10-CM | POA: Diagnosis not present

## 2022-08-21 DIAGNOSIS — Z992 Dependence on renal dialysis: Secondary | ICD-10-CM | POA: Diagnosis not present

## 2022-08-22 ENCOUNTER — Telehealth: Payer: Self-pay | Admitting: Internal Medicine

## 2022-08-22 DIAGNOSIS — N186 End stage renal disease: Secondary | ICD-10-CM | POA: Diagnosis not present

## 2022-08-22 DIAGNOSIS — Z992 Dependence on renal dialysis: Secondary | ICD-10-CM | POA: Diagnosis not present

## 2022-08-22 MED ORDER — METOCLOPRAMIDE HCL 10 MG PO TABS: 10.0000 mg | ORAL_TABLET | Freq: Every day | ORAL | 1 refills | Status: DC

## 2022-08-22 NOTE — Telephone Encounter (Signed)
Rx sent 

## 2022-08-22 NOTE — Telephone Encounter (Signed)
Inbound call from patient wife , requesting a refill for Metoclopramide. PT is schedule to follow up with Victorino Dike on 7/17.Please advise

## 2022-08-23 DIAGNOSIS — N186 End stage renal disease: Secondary | ICD-10-CM | POA: Diagnosis not present

## 2022-08-23 DIAGNOSIS — Z992 Dependence on renal dialysis: Secondary | ICD-10-CM | POA: Diagnosis not present

## 2022-08-24 DIAGNOSIS — Z992 Dependence on renal dialysis: Secondary | ICD-10-CM | POA: Diagnosis not present

## 2022-08-24 DIAGNOSIS — N186 End stage renal disease: Secondary | ICD-10-CM | POA: Diagnosis not present

## 2022-08-25 DIAGNOSIS — Z992 Dependence on renal dialysis: Secondary | ICD-10-CM | POA: Diagnosis not present

## 2022-08-25 DIAGNOSIS — N186 End stage renal disease: Secondary | ICD-10-CM | POA: Diagnosis not present

## 2022-08-26 DIAGNOSIS — N186 End stage renal disease: Secondary | ICD-10-CM | POA: Diagnosis not present

## 2022-08-26 DIAGNOSIS — Z992 Dependence on renal dialysis: Secondary | ICD-10-CM | POA: Diagnosis not present

## 2022-08-27 ENCOUNTER — Other Ambulatory Visit: Payer: Self-pay

## 2022-08-27 DIAGNOSIS — N186 End stage renal disease: Secondary | ICD-10-CM | POA: Diagnosis not present

## 2022-08-27 DIAGNOSIS — Z992 Dependence on renal dialysis: Secondary | ICD-10-CM | POA: Diagnosis not present

## 2022-08-27 MED ORDER — INSULIN NPH (HUMAN) (ISOPHANE) 100 UNIT/ML ~~LOC~~ SUSP: 65.0000 [IU] | Freq: Every day | SUBCUTANEOUS | 1 refills | Status: DC

## 2022-08-28 DIAGNOSIS — N186 End stage renal disease: Secondary | ICD-10-CM | POA: Diagnosis not present

## 2022-08-28 DIAGNOSIS — Z992 Dependence on renal dialysis: Secondary | ICD-10-CM | POA: Diagnosis not present

## 2022-08-29 ENCOUNTER — Ambulatory Visit: Payer: Managed Care, Other (non HMO) | Admitting: Family Medicine

## 2022-08-29 DIAGNOSIS — N186 End stage renal disease: Secondary | ICD-10-CM | POA: Diagnosis not present

## 2022-08-29 DIAGNOSIS — Z992 Dependence on renal dialysis: Secondary | ICD-10-CM | POA: Diagnosis not present

## 2022-08-30 DIAGNOSIS — Z992 Dependence on renal dialysis: Secondary | ICD-10-CM | POA: Diagnosis not present

## 2022-08-30 DIAGNOSIS — N186 End stage renal disease: Secondary | ICD-10-CM | POA: Diagnosis not present

## 2022-08-31 DIAGNOSIS — Z992 Dependence on renal dialysis: Secondary | ICD-10-CM | POA: Diagnosis not present

## 2022-08-31 DIAGNOSIS — N186 End stage renal disease: Secondary | ICD-10-CM | POA: Diagnosis not present

## 2022-09-01 DIAGNOSIS — Z992 Dependence on renal dialysis: Secondary | ICD-10-CM | POA: Diagnosis not present

## 2022-09-01 DIAGNOSIS — N186 End stage renal disease: Secondary | ICD-10-CM | POA: Diagnosis not present

## 2022-09-02 ENCOUNTER — Other Ambulatory Visit: Payer: Self-pay | Admitting: Internal Medicine

## 2022-09-02 DIAGNOSIS — Z992 Dependence on renal dialysis: Secondary | ICD-10-CM | POA: Diagnosis not present

## 2022-09-02 DIAGNOSIS — N186 End stage renal disease: Secondary | ICD-10-CM | POA: Diagnosis not present

## 2022-09-03 ENCOUNTER — Ambulatory Visit: Payer: Self-pay | Admitting: Family Medicine

## 2022-09-03 DIAGNOSIS — N186 End stage renal disease: Secondary | ICD-10-CM | POA: Diagnosis not present

## 2022-09-03 DIAGNOSIS — Z992 Dependence on renal dialysis: Secondary | ICD-10-CM | POA: Diagnosis not present

## 2022-09-04 DIAGNOSIS — Z992 Dependence on renal dialysis: Secondary | ICD-10-CM | POA: Diagnosis not present

## 2022-09-04 DIAGNOSIS — N186 End stage renal disease: Secondary | ICD-10-CM | POA: Diagnosis not present

## 2022-09-05 DIAGNOSIS — Z992 Dependence on renal dialysis: Secondary | ICD-10-CM | POA: Diagnosis not present

## 2022-09-05 DIAGNOSIS — N186 End stage renal disease: Secondary | ICD-10-CM | POA: Diagnosis not present

## 2022-09-06 DIAGNOSIS — Z992 Dependence on renal dialysis: Secondary | ICD-10-CM | POA: Diagnosis not present

## 2022-09-06 DIAGNOSIS — N186 End stage renal disease: Secondary | ICD-10-CM | POA: Diagnosis not present

## 2022-09-07 DIAGNOSIS — Z992 Dependence on renal dialysis: Secondary | ICD-10-CM | POA: Diagnosis not present

## 2022-09-07 DIAGNOSIS — N186 End stage renal disease: Secondary | ICD-10-CM | POA: Diagnosis not present

## 2022-09-08 DIAGNOSIS — Z992 Dependence on renal dialysis: Secondary | ICD-10-CM | POA: Diagnosis not present

## 2022-09-08 DIAGNOSIS — N186 End stage renal disease: Secondary | ICD-10-CM | POA: Diagnosis not present

## 2022-09-09 DIAGNOSIS — Z992 Dependence on renal dialysis: Secondary | ICD-10-CM | POA: Diagnosis not present

## 2022-09-09 DIAGNOSIS — N186 End stage renal disease: Secondary | ICD-10-CM | POA: Diagnosis not present

## 2022-09-10 DIAGNOSIS — N186 End stage renal disease: Secondary | ICD-10-CM | POA: Diagnosis not present

## 2022-09-10 DIAGNOSIS — Z992 Dependence on renal dialysis: Secondary | ICD-10-CM | POA: Diagnosis not present

## 2022-09-11 ENCOUNTER — Ambulatory Visit: Payer: Managed Care, Other (non HMO) | Admitting: Internal Medicine

## 2022-09-11 DIAGNOSIS — N186 End stage renal disease: Secondary | ICD-10-CM | POA: Diagnosis not present

## 2022-09-11 DIAGNOSIS — Z992 Dependence on renal dialysis: Secondary | ICD-10-CM | POA: Diagnosis not present

## 2022-09-11 NOTE — Progress Notes (Deleted)
Name: Chase Clark  Age/ Sex: 57 y.o., male   MRN/ DOB: [id number], [birth year]     PCP: Glori Luis, MD   Reason for Endocrinology Evaluation: Type 1 Diabetes Mellitus  Initial Endocrine Consultative Visit: 06/29/2019    PATIENT IDENTIFIER: Chase Clark is a 57 y.o. 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 The patient has followed with Endocrinology clinic since 06/29/2019 for consultative assistance with management of his diabetes.  DIABETIC HISTORY:  Mr. Seachrist was diagnosed with  T1DM in 1990, was initially diagnosed as type 2 until years ago when he was diagnosed with T1DM, has been on insulin for ~ 15 yrs. He tried Ozempic 04/2019 with GI intolerance. His hemoglobin A1c has ranged from 5.9%  in 2021, peaking at 15.3%in 2019.    HYPOGONADISM HISTORY: He was noted with low testosterone levels (239.76 NG/DL in July 1914) after presenting to his PCP with gynecomastia, and slight elevation of the prolactin at 32.6 NG/mL with inappropriately normal LH at 7.21mIU/mL .  Mammogram 10/16/2021 was unrevealing except for bilateral gynecomastia and no malignancy  Patient has chronic erectile dysfunction for years No personal history of DVT/PE or prostate cancer  Repeat testosterone levels through [facility] were low at 176 NG/DL 10/8293.  Pituitary MRI did not reveal any adenoma   Topical testosterone December 2023   SUBJECTIVE:   During the last visit (04/10/2022): This was a virtual visit    Today (09/11/2022): Chase Clark is here for a follow up on diabetes management.  He is accompanied by his wife Chase Clark today.He checks his glucose multiple times a day through the dexcom   He was evaluated at the kidney/pancreas transplant at [facility] in June 2023 He had pretransplant evaluation by cardiology 02/2022, functional capacity was consistent with severe functional impairment, there was  maximal signs of circulatory limitations to exercise  He was followed by podiatry on 07/03/2022    Has chronic loose stools 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, + 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  Last Eye Exam: Completed 04/2021   Macrovascular complications:  CAD (S/P CABG 01/2019)  Denies: CVA, PVD   HISTORY:  Past Medical History:  Past Medical History:  Diagnosis Date   Anemia    Arthritis    Asthma    as child   Barrett's esophagus 02/16/2014   short segment   Cataract    Chronic kidney disease (CKD), stage IV (severe) (HCC)    Coronary artery disease    DDD (degenerative disc disease), cervical    Diabetes mellitus without complication (HCC)    Diabetic osteomyelitis (HCC)    Diabetic peripheral neuropathy (HCC)    Diabetic ulcer of foot with muscle involvement without evidence of necrosis (HCC)  DIABETIC ULCERATIONS ASSOCIATED WITH IRRITATION LATERAL ANKLE LEFT GREATER THAN RIGHT WITH MILD CELLULITIS    Diverticulosis    DKA (diabetic ketoacidoses) 08/23/2017   Edema, lower extremity    Fatty liver    Gastroparesis 2015   GERD (gastroesophageal reflux disease)    Gilbert's disease    Glaucoma    Hx of BKA, left (HCC)    Hx of CABG 01/27/2019   2 vessels; LIMA-LAD, SVG-D1   Hx of heart artery stent 11/20/2009   80% D1 stenosis - 2.5 x 18 mm Xience V Everolimus (DES) stent x 1 placed   Hyperlipidemia     Hypertension    Left below-knee amputee (HCC)    Legally blind    Myocardial infarction (HCC) 11/2009   Neuropathy    Osteoarthritis of spine    Osteomyelitis of left foot (HCC)    Peritoneal dialysis catheter in place [facility])    Does dialysis over night.   PONV (postoperative nausea and vomiting)    PVD (peripheral vascular disease) (HCC)    Seasonal allergies    Shoulder pain    Tubular adenoma of colon    Ulcer of foot (HCC) 11/17/2012   DIABETIC ULCERATIONS ASSOCIATED WITH IRRITATION LATERAL ANKLE LEFT GREATER THAN RIGHT WITH MILD CELLULITIS   Past Surgical History:  Past Surgical History:  Procedure Laterality Date   ABDOMINAL AORTOGRAM N/A 05/20/2016   Procedure: Abdominal Aortogram possible intervention;  Surgeon: Renford Dills, MD;  Location: [facility] INVASIVE CV LAB;  Service: Cardiovascular;  Laterality: N/A;   AMPUTATION Left 09/05/2017   Procedure: AMPUTATION BELOW KNEE;  Surgeon: Jones Broom, MD;  Location: [facility] OR;  Service: Orthopedics;  Laterality: Left;   ANTERIOR CERVICAL DECOMP/DISCECTOMY FUSION N/A 05/07/2017   Procedure: ANTERIOR CERVICAL DECOMPRESSION/DISCECTOMY Patrina Levering PROSTHESIS,PLATE/SCREWS CERVICAL FIVE - CERVICAL SIX;  Surgeon: Tressie Stalker, MD;  Location: [facility] OR;  Service: Neurosurgery;  Laterality: N/A;   CAPD INSERTION N/A 08/30/2020   Procedure: LAPAROSCOPIC INSERTION CONTINUOUS AMBULATORY PERITONEAL DIALYSIS  (CAPD) CATHETER;  Surgeon: Henrene Dodge, MD;  Location: [facility] ORS;  Service: General;  Laterality: N/A;   CATARACT EXTRACTION W/ INTRAOCULAR LENS IMPLANT     CATARACT EXTRACTION W/PHACO Left 02/26/2017   Procedure: CATARACT EXTRACTION PHACO AND INTRAOCULAR LENS PLACEMENT (IOC);  Surgeon: Nevada Crane, MD;  Location: [facility] ORS;  Service: Ophthalmology;  Laterality: Left;  Lot # [id number] H [place]: 00:25.3 AP%: 6.2 CDE: 1.59    CHOLECYSTECTOMY N/A    COLONOSCOPY WITH PROPOFOL N/A 05/30/2021   Procedure: COLONOSCOPY WITH PROPOFOL;   Surgeon: Beverley Fiedler, MD;  Location: [facility] ENDOSCOPY;  Service: Gastroenterology;  Laterality: N/A;   CORONARY ANGIOPLASTY WITH STENT PLACEMENT  11/20/2009   80% D1 stenosis - 2.5 x 18 mm Xience V Everolimus (DES) stent x 1 placed; LOCATION: [facility]; SURGEON: Harold Hedge, MD   CORONARY ARTERY BYPASS GRAFT N/A 01/27/2019   Procedure: OFF PUMP CORONARY ARTERY BYPASS GRAFTING (CABG) X 2 WITH ENDOSCOPIC HARVESTING OF RIGHT GREATER SAPHENOUS VEIN. LIMA TO LAD;  Surgeon: Corliss Skains, MD;  Location: [facility] OR;  Service: Open Heart Surgery;  Laterality: N/A;   CORONARY STENT INTERVENTION N/A 01/21/2019   Procedure: CORONARY STENT INTERVENTION;  Surgeon: Lyn Records, MD;  Location: [facility] INVASIVE CV LAB;  Service: Cardiovascular;  Laterality: N/A;   EPIBLEPHERON REPAIR WITH TEAR DUCT PROBING     EYE SURGERY Right 02/09/2014   cataract extraction   INSERTION EXPRESS TUBE SHUNT Right 09/06/2015   Procedure: INSERTION AHMED TUBE SHUNT with tutoplast allograft;  Surgeon: Nevada Crane, MD;  Location: [facility] ORS;  Service: Ophthalmology;  Laterality: Right;   laparoscopic insertion continuous peritoneal dialysis  2022   LEFT HEART CATH AND CORONARY ANGIOGRAPHY N/A 01/20/2019   Procedure: LEFT HEART CATH AND CORONARY ANGIOGRAPHY;  Surgeon: Runell Gess, MD;  Location: [facility] INVASIVE CV LAB;  Service: Cardiovascular;  Laterality: N/A;   POLYPECTOMY  05/30/2021   Procedure: POLYPECTOMY;  Surgeon: Beverley Fiedler, MD;  Location: [facility] ENDOSCOPY;  Service: Gastroenterology;;   TEE WITHOUT CARDIOVERSION N/A 01/27/2019   Procedure: TRANSESOPHAGEAL ECHOCARDIOGRAM (TEE);  Surgeon: Corliss Skains, MD;  Location: [facility] OR;  Service: Open Heart Surgery;  Laterality: N/A;   VASECTOMY     Social History:  reports that he quit smoking about 14 years ago. His smoking use included cigarettes. He has a 10.00 pack-year smoking history. He has never used smokeless tobacco. He reports that he does not currently use alcohol. He  reports that he does not use drugs. Family History:  Family History  Problem Relation Age of Onset   Hyperlipidemia Mother    Diabetes Mother    Liver disease Mother        NASH   Other Mother        immune thrombocytopenic pupura (ITP)   Hyperlipidemia Father    Diabetes Father    Heart disease Father    Hypertension Father    Lymphoma Father    Hemochromatosis Other        Grandmother (? which side)   Polycythemia Other    Colon cancer Neg Hx    Esophageal cancer Neg Hx    Rectal cancer Neg Hx    Stomach cancer Neg Hx      HOME MEDICATIONS: Allergies as of 09/11/2022   No Known Allergies      Medication List        Accurate as of Sep 11, 2022  7:28 AM. If you have any questions, ask your nurse or doctor.          acetaminophen 325 MG tablet Commonly known as: TYLENOL Take 1 tablet (325 mg total) by mouth every 6 (six) hours as needed for mild pain (pain score 1-3 or temp > 100.5).   aspirin 81 MG chewable tablet Commonly known as: Aspirin Childrens Chew 1 tablet (81 mg total) by mouth daily.   atorvastatin 80 MG tablet Commonly known as: LIPITOR TAKE 1 TABLET BY MOUTH EVERY DAY What changed: when to take this   brimonidine-timolol 0.2-0.5 % ophthalmic solution Commonly known as: COMBIGAN Place 1 drop into both eyes every 12 (twelve) hours.   Dexcom G6 Transmitter Misc Inject 1 Device into the skin as directed. Use to check blood sugar at least 4 times daily   Dexcom G7 Sensor Misc USE 1 DEVICE AS DIRECTED   dorzolamide 2 % ophthalmic solution Commonly known as: TRUSOPT Place 1 drop into both eyes 2 (two) times daily.   esomeprazole 20 MG capsule Commonly known as: NEXIUM Take 20 mg by mouth daily.   ezetimibe 10 MG tablet Commonly known as: ZETIA TAKE 1 TABLET BY MOUTH EVERY DAY   Flomax 0.4 MG Caps capsule Generic drug: tamsulosin 0.4 mg at bedtime.   fluticasone 50 MCG/ACT nasal spray Commonly known as: FLONASE SPRAY 2 SPRAYS INTO  EACH NOSTRIL EVERY DAY What changed: See the new instructions.   gabapentin 300 MG capsule Commonly known as: NEURONTIN TAKE 1 CAPSULE BY MOUTH TWICE A DAY   gentamicin cream 0.1 % Commonly  known as: GARAMYCIN Apply 1 application topically every other day. Around port   insulin NPH Human 100 UNIT/ML injection Commonly known as: NovoLIN N Inject 0.65 mLs (65 Units total) into the skin at bedtime.   Insulin Pen Needle 32G X 4 MM Misc Inject 1 Device into the skin 5 (five) times daily. Use to inject Lantus and Novolog up to 5 times daily   Insulin Syringe-Needle U-100 28G X 1/2" 1 ML Misc 1 Device by Does not apply route daily in the afternoon.   ketoconazole 2 % cream Commonly known as: NIZORAL APPLY 1 APPLICATION TOPICALLY DAILY   lactulose 10 GM/15ML solution Commonly known as: CHRONULAC TAKE 30 MLS (20 G TOTAL) BY MOUTH AS NEEDED.   latanoprost 0.005 % ophthalmic solution Commonly known as: XALATAN Place 1 drop into both eyes 2 (two) times daily.   LUBRICANT EYE DROPS OP Place 1 drop into both eyes 2 (two) times daily.   metoCLOPramide 10 MG tablet Commonly known as: REGLAN Take 1 tablet (10 mg total) by mouth at bedtime. NEEDS OFFICE VISIT FOR FURTHER REFILLS   midodrine 5 MG tablet Commonly known as: PROAMATINE Take 5 mg by mouth 2 (two) times daily as needed (Below 100).   MULTIVITAMIN ADULT PO Take 1 tablet by mouth daily.   NovoLOG FlexPen 100 UNIT/ML FlexPen Generic drug: insulin aspart Max daily 45 units daily   ondansetron 4 MG disintegrating tablet Commonly known as: ZOFRAN-ODT Take 4 mg by mouth every 8 (eight) hours as needed for nausea or vomiting.   PARoxetine 10 MG tablet Commonly known as: PAXIL Take 10 mg by mouth daily.   prednisoLONE acetate 1 % ophthalmic suspension Commonly known as: PRED FORTE Place 1 drop into the left eye 4 (four) times daily.   senna 8.6 MG tablet Commonly known as: SENOKOT Take 2 tablets by mouth daily.    Testosterone 20.25 MG/ACT (1.62%) Gel PLACE 1 PUMP ONTO THE SKIN DAILY IN THE AFTERNOON.   torsemide 20 MG tablet Commonly known as: DEMADEX Take 20-40 mg by mouth See admin instructions. Take 40 mg in the morning and 20 mg in the afternoon   Tresiba FlexTouch 200 UNIT/ML FlexTouch Pen Generic drug: insulin degludec INJECT 40 UNITS INTO THE SKIN TWICE A DAY   Vitamin D 50 MCG (2000 UT) Caps Take 2,000 Units by mouth daily.         OBJECTIVE:   Vital Signs: There were no vitals taken for this visit.  Wt Readings from Last 3 Encounters:  02/28/22 241 lb 9.6 oz (109.6 kg)  12/06/21 245 lb 3.2 oz (111.2 kg)  08/26/21 231 lb (104.8 kg)     Exam: General: Pt appears well and is in NAD  Neuro: MS is good with appropriate affect, pt is alert and Ox3        DATA REVIEWED:  Lab Results  Component Value Date   HGBA1C 6.9 (A) 12/06/2021   HGBA1C 6.6 (A) 11/15/2020   HGBA1C 7.0 (H) 06/05/2020    09/13/2021 @[facility]  Everywhere A1c 6.9%   ASSESSMENT / PLAN / RECOMMENDATIONS:   1)Type 1 Diabetes Mellitus, optimally controlled, With ESRD on PD Hx of left BKA, retinopathy and macrovascular complications - Most recent A1c of 6.9 %. Goal A1c < 7.5 %.    - His BG's in the morning have been in 170's . He did have an episode of a BG 80 mg/dL after breakfast with taking 10 units but he attributes this to insulin-carb mismatch  - He  is pending evaluation for a renal transplant, I am wondering if he would qualify for a pancreatic transplant as well, he will check with transplant team   - No changes at this time  MEDICATIONS: -Continue Tresiba 40  units twice a day  -Continue Humalog 10-15 units with each meal  -Continue  NPH 65 units at the beginning of dialysis     EDUCATION / INSTRUCTIONS: BG monitoring instructions: Patient is instructed to check his blood sugars 4 times a day, before meals and bedtime  Call [facility] if: BG persistently < 70  I reviewed  the Rule of 15 for the treatment of hypoglycemia in detail with the patient. Literature supplied.   2) Diabetic complications:  Eye: Does  have known diabetic retinopathy.  Neuro/ Feet: Does have known diabetic peripheral neuropathy .  Renal: Patient does  have known baseline CKD. He   is on peritoneal dialysis    3) Hypogonadism:   -Patient with erectile dysfunction, I did explain to him that this is most likely multifactorial given low testosterone, neuropathy, and ESRD  -Repeat testosterone was low at 176 - Pituitary MRI was negative  - Started testosterone recently with no rash thus far -PSA and CBC are normal  Medication  Continue Testosterone gel 1 pump daily    F/U in 6 months    Signed electronically by: Lyndle Herrlich, MD  [facility]  [facility] [address] [place] [place], [place] [address] Phone: [phone] FAX: [phone]   CC: Glori Luis, MD [address] [place] [address] Phone: [phone]  Fax: [phone]  Return to Endocrinology clinic as below: Future Appointments  Date Time Provider Department Center  09/11/2022  9:30 AM Edilia Ghuman, Konrad Dolores, MD [facility] None  09/16/2022  9:00 AM Glori Luis, MD [facility]  09/22/2022  8:45 AM Willeen Niece, MD [facility] None  10/02/2022  3:15 PM Freddie Breech, DPM [facility]  10/06/2022  3:00 PM End, Cristal Deer, MD [facility] None  10/29/2022  3:00 PM Unk Lightning, PA [facility]

## 2022-09-12 DIAGNOSIS — N186 End stage renal disease: Secondary | ICD-10-CM | POA: Diagnosis not present

## 2022-09-12 DIAGNOSIS — Z992 Dependence on renal dialysis: Secondary | ICD-10-CM | POA: Diagnosis not present

## 2022-09-13 DIAGNOSIS — Z992 Dependence on renal dialysis: Secondary | ICD-10-CM | POA: Diagnosis not present

## 2022-09-13 DIAGNOSIS — N186 End stage renal disease: Secondary | ICD-10-CM | POA: Diagnosis not present

## 2022-09-14 DIAGNOSIS — Z992 Dependence on renal dialysis: Secondary | ICD-10-CM | POA: Diagnosis not present

## 2022-09-14 DIAGNOSIS — N186 End stage renal disease: Secondary | ICD-10-CM | POA: Diagnosis not present

## 2022-09-15 DIAGNOSIS — N186 End stage renal disease: Secondary | ICD-10-CM | POA: Diagnosis not present

## 2022-09-15 DIAGNOSIS — Z992 Dependence on renal dialysis: Secondary | ICD-10-CM | POA: Diagnosis not present

## 2022-09-16 ENCOUNTER — Ambulatory Visit (INDEPENDENT_AMBULATORY_CARE_PROVIDER_SITE_OTHER): Payer: Medicare Other | Admitting: Family Medicine

## 2022-09-16 ENCOUNTER — Ambulatory Visit (INDEPENDENT_AMBULATORY_CARE_PROVIDER_SITE_OTHER): Payer: Medicare Other

## 2022-09-16 ENCOUNTER — Encounter: Payer: Self-pay | Admitting: Family Medicine

## 2022-09-16 VITALS — BP 128/76 | HR 65 | Temp 98.2°F | Ht 70.0 in | Wt 263.0 lb

## 2022-09-16 DIAGNOSIS — M545 Low back pain, unspecified: Secondary | ICD-10-CM

## 2022-09-16 DIAGNOSIS — E785 Hyperlipidemia, unspecified: Secondary | ICD-10-CM | POA: Diagnosis not present

## 2022-09-16 DIAGNOSIS — E103593 Type 1 diabetes mellitus with proliferative diabetic retinopathy without macular edema, bilateral: Secondary | ICD-10-CM

## 2022-09-16 DIAGNOSIS — I152 Hypertension secondary to endocrine disorders: Secondary | ICD-10-CM

## 2022-09-16 DIAGNOSIS — N186 End stage renal disease: Secondary | ICD-10-CM | POA: Diagnosis not present

## 2022-09-16 DIAGNOSIS — Z794 Long term (current) use of insulin: Secondary | ICD-10-CM

## 2022-09-16 DIAGNOSIS — Z992 Dependence on renal dialysis: Secondary | ICD-10-CM | POA: Diagnosis not present

## 2022-09-16 DIAGNOSIS — J309 Allergic rhinitis, unspecified: Secondary | ICD-10-CM | POA: Diagnosis not present

## 2022-09-16 DIAGNOSIS — M7061 Trochanteric bursitis, right hip: Secondary | ICD-10-CM | POA: Diagnosis not present

## 2022-09-16 DIAGNOSIS — G8929 Other chronic pain: Secondary | ICD-10-CM

## 2022-09-16 DIAGNOSIS — E1169 Type 2 diabetes mellitus with other specified complication: Secondary | ICD-10-CM

## 2022-09-16 DIAGNOSIS — E1159 Type 2 diabetes mellitus with other circulatory complications: Secondary | ICD-10-CM

## 2022-09-16 LAB — LDL CHOLESTEROL, DIRECT: Direct LDL: 47 mg/dL

## 2022-09-16 LAB — LIPID PANEL
Cholesterol: 124 mg/dL (ref 0–200)
HDL: 27.4 mg/dL — ABNORMAL LOW (ref >39.00)
NonHDL: 96.72
Total CHOL/HDL Ratio: 5
Triglycerides: 365 mg/dL — ABNORMAL HIGH (ref 0.0–149.0)
VLDL: 73 mg/dL — ABNORMAL HIGH (ref 0.0–40.0)

## 2022-09-16 LAB — HEPATIC FUNCTION PANEL
ALT: 27 U/L (ref 0–53)
AST: 34 U/L (ref 0–37)
Albumin: 3.2 g/dL — ABNORMAL LOW (ref 3.5–5.2)
Alkaline Phosphatase: 98 U/L (ref 39–117)
Bilirubin, Direct: 0.1 mg/dL (ref 0.0–0.3)
Total Bilirubin: 0.6 mg/dL (ref 0.2–1.2)
Total Protein: 6.1 g/dL (ref 6.0–8.3)

## 2022-09-16 MED ORDER — AZELASTINE HCL 0.1 % NA SOLN
2.0000 | Freq: Two times a day (BID) | NASAL | 12 refills | Status: DC
Start: 2022-09-16 — End: 2023-08-28

## 2022-09-16 NOTE — Assessment & Plan Note (Signed)
Chronic issue.  Patient reports he has seen orthopedics and had 2 injections though they were not beneficial.  He wants to defer physical therapy for this so that he can have physical therapy for his back.  I encouraged him and his wife to contact his insurance to see if they would cover physical therapy for 2 separate issues at 1 time.

## 2022-09-16 NOTE — Assessment & Plan Note (Signed)
Chronic issue.  Suspect arthritic changes.  Will get an x-ray today given how long this has been going on.  I will refer him for physical therapy as well.

## 2022-09-16 NOTE — Patient Instructions (Signed)
Nice to see you. Please try the Astelin nasal spray.  If it is not beneficial please let me know. Physical therapy should contact you to schedule an evaluation for your back.  If you do not hear from them in the next week please let us know.  Will contact you with your lab and x-ray results.

## 2022-09-16 NOTE — Progress Notes (Signed)
Marikay Alar, MD Phone: [phone]  Chase Clark is a 57 y.o. male who presents today for f/u.  DIABETES Disease Monitoring: Blood Sugar ranges-"good", notes sometimes it goes up at night, notes recent A1c through nephro was around 6.3 Polyuria/phagia/dipsia- no  Medications: Compliance- taking NPH 65 u qhs prn, novolog 15-20 u tid with meals, tresiba 40 u BID Hypoglycemic symptoms- no  HYPERTENSION Disease Monitoring Home BP Monitoring typically <140/90 recently, was running higher until started irbesartan Chest pain- no    Dyspnea- no Medications Compliance-  taking irbesartan.  BMET    Component Value Date/Time   NA 136 12/06/2021 0837   NA 140 02/14/2019 1038   K 4.4 12/06/2021 0837   K 4.5 12/29/2013 1521   CL 101 12/06/2021 0837   CO2 29 12/06/2021 0837   GLUCOSE 163 (H) 12/06/2021 0837   BUN 33 (H) 12/06/2021 0837   BUN 26 (H) 02/14/2019 1038   CREATININE 3.66 (H) 12/06/2021 0837   CREATININE 2.42 (H) 12/31/2018 1454   CALCIUM 8.8 12/06/2021 0837   GFRNONAA 11 (L) 08/11/2020 0334   GFRAA 28 (L) 03/09/2019 1551   Allergic rhinitis: Patient reports this has been bad this year.  He notes rhinorrhea, congestion, clear mucus out of his nose, sneezing, postnasal drip, and cough.  He is not taking any medication for this.  Low back pain: This is a chronic issue going on for many years.  Hurts quite a bit when he bends over.  He gets significantly better when he sits.  No numbness, weakness, or saddle anesthesia.  He has not done physical therapy for this in the past.  Right hip bursitis: Patient notes this limits his ability to walk for long durations.  He is in the process of getting approved for kidney transplant and notes he had to do a pulmonary exertion test that he was unable to complete due to the hip pain.   Social History   Tobacco Use  Smoking Status Former   Packs/day: 0.50   Years: 20.00   Additional pack years: 0.00   Total pack years: 10.00    Types: Cigarettes   Quit date: 03/14/2008   Years since quitting: 14.5  Smokeless Tobacco Never    Current Outpatient Medications on File Prior to Visit  Medication Sig Dispense Refill   acetaminophen (TYLENOL) 325 MG tablet Take 1 tablet (325 mg total) by mouth every 6 (six) hours as needed for mild pain (pain score 1-3 or temp > 100.5). 30 tablet 0   aspirin (ASPIRIN CHILDRENS) 81 MG chewable tablet Chew 1 tablet (81 mg total) by mouth daily. 30 tablet 1   atorvastatin (LIPITOR) 80 MG tablet TAKE 1 TABLET BY MOUTH EVERY DAY (Patient taking differently: Take 80 mg by mouth at bedtime.) 90 tablet 1   brimonidine-timolol (COMBIGAN) 0.2-0.5 % ophthalmic solution Place 1 drop into both eyes every 12 (twelve) hours.     Cholecalciferol (VITAMIN D) 50 MCG (2000 UT) CAPS Take 2,000 Units by mouth daily.     Continuous Blood Gluc Transmit (DEXCOM G6 TRANSMITTER) MISC Inject 1 Device into the skin as directed. Use to check blood sugar at least 4 times daily 1 each 1   Continuous Glucose Sensor (DEXCOM G7 SENSOR) MISC USE 1 DEVICE AS DIRECTED 9 each 3   dorzolamide (TRUSOPT) 2 % ophthalmic solution Place 1 drop into both eyes 2 (two) times daily.     esomeprazole (NEXIUM) 20 MG capsule Take 20 mg by mouth daily.  ezetimibe (ZETIA) 10 MG tablet TAKE 1 TABLET BY MOUTH EVERY DAY 90 tablet 0   FLOMAX 0.4 MG CAPS capsule 0.4 mg at bedtime.     fluticasone (FLONASE) 50 MCG/ACT nasal spray SPRAY 2 SPRAYS INTO EACH NOSTRIL EVERY DAY (Patient taking differently: Place 1 spray into both nostrils daily as needed for allergies.) 48 mL 2   gabapentin (NEURONTIN) 300 MG capsule TAKE 1 CAPSULE BY MOUTH TWICE A DAY 60 capsule 6   gentamicin cream (GARAMYCIN) 0.1 % Apply 1 application topically every other day. Around port     insulin aspart (NOVOLOG FLEXPEN) 100 UNIT/ML FlexPen Max daily 45 units daily 45 mL 3   insulin degludec (TRESIBA FLEXTOUCH) 200 UNIT/ML FlexTouch Pen INJECT 40 UNITS INTO THE SKIN TWICE A DAY  36 mL 3   insulin NPH Human (NOVOLIN N) 100 UNIT/ML injection Inject 0.65 mLs (65 Units total) into the skin at bedtime. 70 mL 1   Insulin Pen Needle 32G X 4 MM MISC Inject 1 Device into the skin 5 (five) times daily. Use to inject Lantus and Novolog up to 5 times daily 500 each 3   Insulin Syringe-Needle U-100 28G X 1/2" 1 ML MISC 1 Device by Does not apply route daily in the afternoon. 100 each 3   irbesartan (AVAPRO) 75 MG tablet Take 75 mg by mouth at bedtime.     lactulose (CHRONULAC) 10 GM/15ML solution TAKE 30 MLS (20 G TOTAL) BY MOUTH AS NEEDED. 946 mL 0   latanoprost (XALATAN) 0.005 % ophthalmic solution Place 1 drop into both eyes 2 (two) times daily.     metoCLOPramide (REGLAN) 10 MG tablet Take 1 tablet (10 mg total) by mouth at bedtime. NEEDS OFFICE VISIT FOR FURTHER REFILLS 30 tablet 1   midodrine (PROAMATINE) 5 MG tablet Take 5 mg by mouth 2 (two) times daily as needed (Below 100).     Multiple Vitamin (MULTIVITAMIN ADULT PO) Take 1 tablet by mouth daily.     ondansetron (ZOFRAN-ODT) 4 MG disintegrating tablet Take 4 mg by mouth every 8 (eight) hours as needed for nausea or vomiting.     senna (SENOKOT) 8.6 MG tablet Take 2 tablets by mouth daily.     sertraline (ZOLOFT) 25 MG tablet Take 25 mg by mouth daily.     torsemide (DEMADEX) 20 MG tablet Take 20-40 mg by mouth See admin instructions. Take 40 mg in the morning and 20 mg in the afternoon     PARoxetine (PAXIL) 10 MG tablet Take 10 mg by mouth daily.     No current facility-administered medications on file prior to visit.     ROS see history of present illness  Objective  Physical Exam Vitals:   09/16/22 0908  BP: 128/76  Pulse: 65  Temp: 98.2 F (36.8 C)  SpO2: 98%    BP Readings from Last 3 Encounters:  09/16/22 128/76  07/03/22 (!) 177/88  03/20/22 138/82   Wt Readings from Last 3 Encounters:  09/16/22 263 lb (119.3 kg)  02/28/22 241 lb 9.6 oz (109.6 kg)  12/06/21 245 lb 3.2 oz (111.2 kg)     Physical Exam Constitutional:      General: He is not in acute distress.    Appearance: He is not diaphoretic.  Cardiovascular:     Rate and Rhythm: Normal rate and regular rhythm.     Heart sounds: Normal heart sounds.  Pulmonary:     Effort: Pulmonary effort is normal.     Breath sounds:  Normal breath sounds.  Musculoskeletal:     Comments: No midline spine tenderness, no midline spine step-off, no muscular back tenderness, there is tenderness over the right greater trochanter  Skin:    General: Skin is warm and dry.  Neurological:     Mental Status: He is alert.      Assessment/Plan: Please see individual problem list.  Hypertension associated with diabetes (HCC) Assessment & Plan: Chronic issue.  Blood pressure has improved quite a bit on irbesartan.  For now he will continue irbesartan 75 mg daily.  He has lab work next week through nephrology.   Hyperlipidemia associated with type 2 diabetes mellitus (HCC)  Allergic rhinitis, unspecified seasonality, unspecified trigger Assessment & Plan: Chronic issue.  Worsened this year.  He will trial Astelin nasal spray 2 sprays each nostril twice daily.  If not beneficial they will let us know.  Orders: -     Azelastine HCl; Place 2 sprays into both nostrils 2 (two) times daily. 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  Per report A1c is well-controlled.  He will continue Tresiba 40 units twice daily, NPH 65 units nightly as needed, and sliding scale NovoLog.  He will see endocrinology as planned.   Trochanteric bursitis of right hip Assessment & Plan: Chronic issue.  Patient reports he has seen orthopedics and had 2 injections though they were not beneficial.  He wants to defer physical therapy for this so that he  can have physical therapy for his back.  I encouraged him and his wife to contact his insurance to see if they would cover physical therapy for 2 separate issues at 1 time.   Chronic bilateral low back pain without sciatica Assessment & Plan: Chronic issue.  Suspect arthritic changes.  Will get an x-ray today given how long this has been going on.  I will refer him for physical therapy as well.  Orders: -     Ambulatory referral to Physical Therapy -     DG Lumbar Spine Complete; Future  Hyperlipidemia, unspecified hyperlipidemia type -     Lipid panel -     Hepatic function panel  Encounter for long-term (current) use of insulin (HCC)     Return in about 3 months (around 12/17/2022) for Chronic back pain.   Marikay Alar, MD [facility]

## 2022-09-16 NOTE — Assessment & Plan Note (Signed)
Chronic issue.  Blood pressure has improved quite a bit on irbesartan.  For now he will continue irbesartan 75 mg daily.  He has lab work next week through nephrology.

## 2022-09-16 NOTE — Assessment & Plan Note (Signed)
Chronic issue.  Worsened this year.  He will trial Astelin nasal spray 2 sprays each nostril twice daily.  If not beneficial they will let us know.

## 2022-09-16 NOTE — Assessment & Plan Note (Signed)
Chronic issue.  Per report A1c is well-controlled.  He will continue Tresiba 40 units twice daily, NPH 65 units nightly as needed, and sliding scale NovoLog.  He will see endocrinology as planned.

## 2022-09-17 ENCOUNTER — Other Ambulatory Visit: Payer: Self-pay | Admitting: Internal Medicine

## 2022-09-17 DIAGNOSIS — N186 End stage renal disease: Secondary | ICD-10-CM | POA: Diagnosis not present

## 2022-09-17 DIAGNOSIS — Z992 Dependence on renal dialysis: Secondary | ICD-10-CM | POA: Diagnosis not present

## 2022-09-18 ENCOUNTER — Other Ambulatory Visit: Payer: Self-pay | Admitting: Internal Medicine

## 2022-09-18 ENCOUNTER — Encounter: Payer: Self-pay | Admitting: Family Medicine

## 2022-09-18 DIAGNOSIS — Z992 Dependence on renal dialysis: Secondary | ICD-10-CM | POA: Diagnosis not present

## 2022-09-18 DIAGNOSIS — N186 End stage renal disease: Secondary | ICD-10-CM | POA: Diagnosis not present

## 2022-09-18 MED ORDER — INSULIN LISPRO (1 UNIT DIAL) 100 UNIT/ML (KWIKPEN): PEN_INJECTOR | SUBCUTANEOUS | 3 refills | Status: DC

## 2022-09-19 ENCOUNTER — Other Ambulatory Visit: Payer: Self-pay | Admitting: Internal Medicine

## 2022-09-19 ENCOUNTER — Telehealth: Payer: Self-pay

## 2022-09-19 DIAGNOSIS — Z992 Dependence on renal dialysis: Secondary | ICD-10-CM | POA: Diagnosis not present

## 2022-09-19 DIAGNOSIS — N186 End stage renal disease: Secondary | ICD-10-CM | POA: Diagnosis not present

## 2022-09-19 MED ORDER — HUMULIN N KWIKPEN 100 UNIT/ML ~~LOC~~ SUPN: 65.0000 [IU] | PEN_INJECTOR | Freq: Every day | SUBCUTANEOUS | 4 refills | Status: DC

## 2022-09-19 NOTE — Telephone Encounter (Signed)
Pt has [employer] and they do not cover Novolog and Novolin N. Pt's wife wants to try rx for Humalog and Humulin pt takes Novolog with his meals and 65 units of Novolin N before dialysis.

## 2022-09-20 ENCOUNTER — Telehealth: Payer: Self-pay

## 2022-09-20 DIAGNOSIS — N186 End stage renal disease: Secondary | ICD-10-CM | POA: Diagnosis not present

## 2022-09-20 DIAGNOSIS — Z992 Dependence on renal dialysis: Secondary | ICD-10-CM | POA: Diagnosis not present

## 2022-09-20 NOTE — Telephone Encounter (Signed)
Left vm that Insulin Lispro which is Humalog replaces the Novolog because that's what insurance will cover.

## 2022-09-21 DIAGNOSIS — N186 End stage renal disease: Secondary | ICD-10-CM | POA: Diagnosis not present

## 2022-09-21 DIAGNOSIS — Z992 Dependence on renal dialysis: Secondary | ICD-10-CM | POA: Diagnosis not present

## 2022-09-22 ENCOUNTER — Ambulatory Visit (INDEPENDENT_AMBULATORY_CARE_PROVIDER_SITE_OTHER): Payer: Medicare Other | Admitting: Dermatology

## 2022-09-22 VITALS — BP 131/87

## 2022-09-22 DIAGNOSIS — I872 Venous insufficiency (chronic) (peripheral): Secondary | ICD-10-CM

## 2022-09-22 DIAGNOSIS — W908XXA Exposure to other nonionizing radiation, initial encounter: Secondary | ICD-10-CM | POA: Diagnosis not present

## 2022-09-22 DIAGNOSIS — D224 Melanocytic nevi of scalp and neck: Secondary | ICD-10-CM | POA: Diagnosis not present

## 2022-09-22 DIAGNOSIS — D2361 Other benign neoplasm of skin of right upper limb, including shoulder: Secondary | ICD-10-CM

## 2022-09-22 DIAGNOSIS — N186 End stage renal disease: Secondary | ICD-10-CM | POA: Diagnosis not present

## 2022-09-22 DIAGNOSIS — L814 Other melanin hyperpigmentation: Secondary | ICD-10-CM | POA: Diagnosis not present

## 2022-09-22 DIAGNOSIS — Z1283 Encounter for screening for malignant neoplasm of skin: Secondary | ICD-10-CM | POA: Diagnosis not present

## 2022-09-22 DIAGNOSIS — D492 Neoplasm of unspecified behavior of bone, soft tissue, and skin: Secondary | ICD-10-CM

## 2022-09-22 DIAGNOSIS — L918 Other hypertrophic disorders of the skin: Secondary | ICD-10-CM | POA: Diagnosis not present

## 2022-09-22 DIAGNOSIS — Z992 Dependence on renal dialysis: Secondary | ICD-10-CM | POA: Diagnosis not present

## 2022-09-22 DIAGNOSIS — L821 Other seborrheic keratosis: Secondary | ICD-10-CM | POA: Diagnosis not present

## 2022-09-22 DIAGNOSIS — D3612 Benign neoplasm of peripheral nerves and autonomic nervous system, upper limb, including shoulder: Secondary | ICD-10-CM

## 2022-09-22 DIAGNOSIS — D225 Melanocytic nevi of trunk: Secondary | ICD-10-CM | POA: Diagnosis not present

## 2022-09-22 DIAGNOSIS — X32XXXA Exposure to sunlight, initial encounter: Secondary | ICD-10-CM

## 2022-09-22 DIAGNOSIS — D229 Melanocytic nevi, unspecified: Secondary | ICD-10-CM

## 2022-09-22 DIAGNOSIS — L578 Other skin changes due to chronic exposure to nonionizing radiation: Secondary | ICD-10-CM

## 2022-09-22 DIAGNOSIS — L720 Epidermal cyst: Secondary | ICD-10-CM

## 2022-09-22 HISTORY — DX: Melanocytic nevi, unspecified: D22.9

## 2022-09-22 MED ORDER — MOMETASONE FUROATE 0.1 % EX CREA: 1.0000 | TOPICAL_CREAM | CUTANEOUS | 1 refills | Status: DC

## 2022-09-22 NOTE — Progress Notes (Signed)
Follow-Up Visit   Subjective  Chase Clark is a 57 y.o. male who presents for the following: Skin Cancer Screening and Full Body Skin Exam, check spots back.  Pt has type I DM with chronic renal failure, neuropathy, and retinopathy.  Pt will be getting a kidney transplant soon.  He is legally blind.  The patient presents for Total-Body Skin Exam (TBSE) for skin cancer screening and mole check. The patient has spots, moles and lesions to be evaluated, some may be new or changing and the patient has concerns that these could be cancer.    The following portions of the chart were reviewed this encounter and updated as appropriate: medications, allergies, medical history  Review of Systems:  No other skin or systemic complaints except as noted in HPI or Assessment and Plan.  Objective  Well appearing patient in no apparent distress; mood and affect are within normal limits.  A full examination was performed including scalp, head, eyes, ears, nose, lips, neck, chest, axillae, abdomen, back, buttocks, bilateral upper extremities, bilateral lower extremities, hands, feet, fingers, toes, fingernails, and toenails. All findings within normal limits unless otherwise noted below.   Relevant physical exam findings are noted in the Assessment and Plan.  R spinal mid back 3.31mm dark brown macule     Vertex scalp 7.51mm firm blue white pap      Back          Assessment & Plan   LENTIGINES, SEBORRHEIC KERATOSES, HEMANGIOMAS - Benign normal skin lesions - Benign-appearing - Call for any changes  MELANOCYTIC NEVI - Tan-brown and/or pink-flesh-colored symmetric macules and papules, photos taken of back today - Benign appearing on exam today - Observation - Call clinic for new or changing moles - Recommend daily use of broad spectrum spf 30+ sunscreen to sun-exposed areas.   ACTINIC DAMAGE - Chronic condition, secondary to cumulative UV/sun exposure - diffuse scaly  erythematous macules with underlying dyspigmentation - Recommend daily broad spectrum sunscreen SPF 30+ to sun-exposed areas, reapply every 2 hours as needed.  - Staying in the shade or wearing long sleeves, sun glasses (UVA+UVB protection) and wide brim hats (4-inch brim around the entire circumference of the hat) are also recommended for sun protection.  - Call for new or changing lesions.  SKIN CANCER SCREENING PERFORMED TODAY.  EPIDERMAL INCLUSION CYST Exam: firm sub q nodule R lower flank  Benign-appearing. Exam most consistent with an epidermal inclusion cyst. Discussed that a cyst is a benign growth that can grow over time and sometimes get irritated or inflamed. Recommend observation if it is not bothersome. Discussed option of surgical excision to remove it if it is growing, symptomatic, or other changes noted. Please call for new or changing lesions so they can be evaluated.  Acrochordons (Skin Tags) - Fleshy, skin-colored pedunculated papules - Benign appearing.  - Observe. - If desired, they can be removed with an in office procedure that is not covered by insurance. - Please call the clinic if you notice any new or changing lesions.   NEUROFIBROMA Exam: soft flesh papule R medial shoulder  Treatment Plan: Benign-appearing.  Observation.  Call clinic for new or changing lesions.  Recommend daily use of broad spectrum spf 30+ sunscreen to sun-exposed areas.     Neoplasm of skin (2) R spinal mid back  Epidermal / dermal shaving  Lesion diameter (cm):  0.6 Informed consent: discussed and consent obtained   Patient was prepped and draped in usual sterile fashion: area prepped  with alcohol. Anesthesia: the lesion was anesthetized in a standard fashion   Anesthetic:  1% lidocaine w/ epinephrine 1-100,000 buffered w/ 8.4% NaHCO3 Instrument used: flexible razor blade   Hemostasis achieved with: pressure, aluminum chloride and electrodesiccation   Outcome: patient tolerated  procedure well   Post-procedure details: wound care instructions given   Post-procedure details comment:  Ointment and small bandage applied  Specimen 1 - Surgical pathology Differential Diagnosis: D48.5 Nevus vs Dysplastic nevus  Check Margins: yes 3.24mm dark brown macule  Vertex scalp  Epidermal / dermal shaving  Lesion diameter (cm):  0.7 Informed consent: discussed and consent obtained   Timeout: patient name, date of birth, surgical site, and procedure verified   Procedure prep:  Patient was prepped and draped in usual sterile fashion Prep type:  Isopropyl alcohol Anesthesia: the lesion was anesthetized in a standard fashion   Anesthetic:  1% lidocaine w/ epinephrine 1-100,000 buffered w/ 8.4% NaHCO3 Instrument used: flexible razor blade   Hemostasis achieved with: pressure, aluminum chloride and electrodesiccation   Outcome: patient tolerated procedure well   Post-procedure details: sterile dressing applied and wound care instructions given   Dressing type: bandage and petrolatum   Additional details:  Pigment went deeper into dermis  Specimen 2 - Surgical pathology Differential Diagnosis: D48.5 Blue Nevus r/o Pigmented BCC  Check Margins: yes 7.60mm firm blue white pap    DERMATOFIBROMA Exam: 1.0cm pink firm depressed macule R shoulder  Treatment Plan: A dermatofibroma is a benign growth possibly related to trauma, such as an insect bite, cut from shaving, or inflamed acne-type bump.  Treatment options to remove include shave or excision with resulting scar and risk of recurrence.  Since benign-appearing and not bothersome, will observe for now.   STASIS DERMATITIS Exam: pink scaly plaque R lat ankle, pink/orange plaque with peau d'orange textural change R pretibia.  Chronic and persistent condition with duration or expected duration over one year. Condition is symptomatic/ bothersome to patient. Not currently at goal.   Stasis in the legs causes chronic leg  swelling, which may result in itchy or painful rashes, skin discoloration, skin texture changes, and sometimes ulceration.  Recommend daily graduated compression hose/stockings- easiest to put on first thing in morning, remove at bedtime.  Elevate legs as much as possible. Avoid salt/sodium rich foods.  Treatment Plan: Start Mometasone cream qd/bid aa R ankle/leg until improved. Avoid scratching area. Continue daily moisturizing cream.  Topical steroids (such as triamcinolone, fluocinolone, fluocinonide, mometasone, clobetasol, halobetasol, betamethasone, hydrocortisone) can cause thinning and lightening of the skin if they are used for too long in the same area. Your physician has selected the right strength medicine for your problem and area affected on the body. Please use your medication only as directed by your physician to prevent side effects.      Return in about 1 year (around 09/22/2023) for TBSE.  I, Ardis Rowan, RMA, am acting as scribe for Willeen Niece, MD .   Documentation: I have reviewed the above documentation for accuracy and completeness, and I agree with the above.  Willeen Niece, MD

## 2022-09-22 NOTE — Patient Instructions (Addendum)
Wound Care Instructions  Cleanse wound gently with soap and water once a day then pat dry with clean gauze. Apply a thin coat of Petrolatum (petroleum jelly, "Vaseline") over the wound (unless you have an allergy to this). We recommend that you use a new, sterile tube of Vaseline. Do not pick or remove scabs. Do not remove the yellow or white "healing tissue" from the base of the wound.  Cover the wound with fresh, clean, nonstick gauze and secure with paper tape. You may use Band-Aids in place of gauze and tape if the wound is small enough, but would recommend trimming much of the tape off as there is often too much. Sometimes Band-Aids can irritate the skin.  You should call the office for your biopsy report after 1 week if you have not already been contacted.  If you experience any problems, such as abnormal amounts of bleeding, swelling, significant bruising, significant pain, or evidence of infection, please call the office immediately.  FOR ADULT SURGERY PATIENTS: If you need something for pain relief you may take 1 extra strength Tylenol (acetaminophen) AND 2 Ibuprofen (200mg each) together every 4 hours as needed for pain. (do not take these if you are allergic to them or if you have a reason you should not take them.) Typically, you may only need pain medication for 1 to 3 days.     Due to recent changes in healthcare laws, you may see results of your pathology and/or laboratory studies on MyChart before the doctors have had a chance to review them. We understand that in some cases there may be results that are confusing or concerning to you. Please understand that not all results are received at the same time and often the doctors may need to interpret multiple results in order to provide you with the best plan of care or course of treatment. Therefore, we ask that you please give us 2 business days to thoroughly review all your results before contacting the office for clarification. Should  we see a critical lab result, you will be contacted sooner.   If You Need Anything After Your Visit  If you have any questions or concerns for your doctor, please call our main line at [phone] and press option 4 to reach your doctor's medical assistant. If no one answers, please leave a voicemail as directed and we will return your call as soon as possible. Messages left after 4 pm will be answered the following business day.   You may also send us a message via MyChart. We typically respond to MyChart messages within 1-2 business days.  For prescription refills, please ask your pharmacy to contact our office. Our fax number is [phone].  If you have an urgent issue when the clinic is closed that cannot wait until the next business day, you can page your doctor at the number below.    Please note that while we do our best to be available for urgent issues outside of office hours, we are not available 24/7.   If you have an urgent issue and are unable to reach us, you may choose to seek medical care at your doctor's office, retail clinic, urgent care center, or emergency room.  If you have a medical emergency, please immediately call 911 or go to the emergency department.  Pager Numbers  - Dr. Kowalski: [phone]  - Dr. Moye: [phone]  - Dr. Stewart: [phone]  In the event of inclement weather, please call our main line at   [phone] for an update on the status of any delays or closures.  Dermatology Medication Tips: Please keep the boxes that topical medications come in in order to help keep track of the instructions about where and how to use these. Pharmacies typically print the medication instructions only on the boxes and not directly on the medication tubes.   If your medication is too expensive, please contact our office at [phone] option 4 or send us a message through MyChart.   We are unable to tell what your co-pay for medications will be in  advance as this is different depending on your insurance coverage. However, we may be able to find a substitute medication at lower cost or fill out paperwork to get insurance to cover a needed medication.   If a prior authorization is required to get your medication covered by your insurance company, please allow us 1-2 business days to complete this process.  Drug prices often vary depending on where the prescription is filled and some pharmacies may offer cheaper prices.  The website www.goodrx.com contains coupons for medications through different pharmacies. The prices here do not account for what the cost may be with help from insurance (it may be cheaper with your insurance), but the website can give you the price if you did not use any insurance.  - You can print the associated coupon and take it with your prescription to the pharmacy.  - You may also stop by our office during regular business hours and pick up a GoodRx coupon card.  - If you need your prescription sent electronically to a different pharmacy, notify our office through [facility] MyChart or by phone at [phone] option 4.     Si Usted Necesita Algo Despus de Su Visita  Tambin puede enviarnos un mensaje a travs de MyChart. Por lo general respondemos a los mensajes de MyChart en el transcurso de 1 a 2 das hbiles.  Para renovar recetas, por favor pida a su farmacia que se ponga en contacto con nuestra oficina. Nuestro nmero de fax es el [phone].  Si tiene un asunto urgente cuando la clnica est cerrada y que no puede esperar hasta el siguiente da hbil, puede llamar/localizar a su doctor(a) al nmero que aparece a continuacin.   Por favor, tenga en cuenta que aunque hacemos todo lo posible para estar disponibles para asuntos urgentes fuera del horario de oficina, no estamos disponibles las 24 horas del da, los 7 das de la semana.   Si tiene un problema urgente y no puede comunicarse con nosotros, puede  optar por buscar atencin mdica  en el consultorio de su doctor(a), en una clnica privada, en un centro de atencin urgente o en una sala de emergencias.  Si tiene una emergencia mdica, por favor llame inmediatamente al 911 o vaya a la sala de emergencias.  Nmeros de bper  - Dr. Kowalski: [phone]  - Dra. Moye: [phone]  - Dra. Stewart: [phone]  En caso de inclemencias del tiempo, por favor llame a nuestra lnea principal al [phone] para una actualizacin sobre el estado de cualquier retraso o cierre.  Consejos para la medicacin en dermatologa: Por favor, guarde las cajas en las que vienen los medicamentos de uso tpico para ayudarle a seguir las instrucciones sobre dnde y cmo usarlos. Las farmacias generalmente imprimen las instrucciones del medicamento slo en las cajas y no directamente en los tubos del medicamento.   Si su medicamento es muy caro, por favor, pngase en contacto con   nuestra oficina llamando al [phone] y presione la opcin 4 o envenos un mensaje a travs de MyChart.   No podemos decirle cul ser su copago por los medicamentos por adelantado ya que esto es diferente dependiendo de la cobertura de su seguro. Sin embargo, es posible que podamos encontrar un medicamento sustituto a menor costo o llenar un formulario para que el seguro cubra el medicamento que se considera necesario.   Si se requiere una autorizacin previa para que su compaa de seguros cubra su medicamento, por favor permtanos de 1 a 2 das hbiles para completar este proceso.  Los precios de los medicamentos varan con frecuencia dependiendo del lugar de dnde se surte la receta y alguna farmacias pueden ofrecer precios ms baratos.  El sitio web www.goodrx.com tiene cupones para medicamentos de diferentes farmacias. Los precios aqu no tienen en cuenta lo que podra costar con la ayuda del seguro (puede ser ms barato con su seguro), pero el sitio web puede darle el  precio si no utiliz ningn seguro.  - Puede imprimir el cupn correspondiente y llevarlo con su receta a la farmacia.  - Tambin puede pasar por nuestra oficina durante el horario de atencin regular y recoger una tarjeta de cupones de GoodRx.  - Si necesita que su receta se enve electrnicamente a una farmacia diferente, informe a nuestra oficina a travs de MyChart de  o por telfono llamando al [phone] y presione la opcin 4.  

## 2022-09-23 ENCOUNTER — Other Ambulatory Visit: Payer: Self-pay | Admitting: Internal Medicine

## 2022-09-23 DIAGNOSIS — Z992 Dependence on renal dialysis: Secondary | ICD-10-CM | POA: Diagnosis not present

## 2022-09-23 DIAGNOSIS — N186 End stage renal disease: Secondary | ICD-10-CM | POA: Diagnosis not present

## 2022-09-24 ENCOUNTER — Encounter: Payer: Self-pay | Admitting: Internal Medicine

## 2022-09-24 ENCOUNTER — Ambulatory Visit: Payer: Medicare Other | Admitting: Internal Medicine

## 2022-09-24 VITALS — BP 134/80 | HR 70 | Ht 70.0 in | Wt 266.0 lb

## 2022-09-24 DIAGNOSIS — Z992 Dependence on renal dialysis: Secondary | ICD-10-CM | POA: Diagnosis not present

## 2022-09-24 DIAGNOSIS — E103593 Type 1 diabetes mellitus with proliferative diabetic retinopathy without macular edema, bilateral: Secondary | ICD-10-CM | POA: Diagnosis not present

## 2022-09-24 DIAGNOSIS — Z89512 Acquired absence of left leg below knee: Secondary | ICD-10-CM | POA: Diagnosis not present

## 2022-09-24 DIAGNOSIS — E1059 Type 1 diabetes mellitus with other circulatory complications: Secondary | ICD-10-CM | POA: Diagnosis not present

## 2022-09-24 DIAGNOSIS — E230 Hypopituitarism: Secondary | ICD-10-CM | POA: Diagnosis not present

## 2022-09-24 DIAGNOSIS — N186 End stage renal disease: Secondary | ICD-10-CM

## 2022-09-24 DIAGNOSIS — E1022 Type 1 diabetes mellitus with diabetic chronic kidney disease: Secondary | ICD-10-CM | POA: Diagnosis not present

## 2022-09-24 DIAGNOSIS — E1042 Type 1 diabetes mellitus with diabetic polyneuropathy: Secondary | ICD-10-CM | POA: Diagnosis not present

## 2022-09-24 LAB — POCT GLYCOSYLATED HEMOGLOBIN (HGB A1C): Hemoglobin A1C: 6.8 % — AB (ref 4.0–5.6)

## 2022-09-24 MED ORDER — TRESIBA FLEXTOUCH 200 UNIT/ML ~~LOC~~ SOPN: 42.0000 [IU] | PEN_INJECTOR | Freq: Two times a day (BID) | SUBCUTANEOUS | 3 refills | Status: DC

## 2022-09-24 MED ORDER — TESTOSTERONE 20.25 MG/ACT (1.62%) TD GEL: 2.0000 | Freq: Every day | TRANSDERMAL | 5 refills | Status: DC

## 2022-09-24 MED ORDER — HUMULIN N KWIKPEN 100 UNIT/ML ~~LOC~~ SUPN: 70.0000 [IU] | PEN_INJECTOR | Freq: Every day | SUBCUTANEOUS | 4 refills | Status: DC

## 2022-09-24 NOTE — Progress Notes (Signed)
Name: Chase Clark  Age/ Sex: 57 y.o., male   MRN/ DOB: [id number], 14-Sep-1965     PCP: Chase Luis, MD   Reason for Endocrinology Evaluation: Type 1 Diabetes Mellitus  Initial Endocrine Consultative Visit: 06/29/2019    PATIENT IDENTIFIER: Chase Clark is a 57 y.o. 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 The patient has followed with Endocrinology clinic since 06/29/2019 for consultative assistance with management of his diabetes.  DIABETIC HISTORY:  Chase Clark was diagnosed with  T1DM in 1990, was initially diagnosed as type 2 until years ago when he was diagnosed with T1DM, has been on insulin for ~ 15 yrs. He tried Ozempic 04/2019 with GI intolerance. His hemoglobin A1c has ranged from 5.9%  in 2021, peaking at 15.3%in 2019.    HYPOGONADISM HISTORY: He was noted with low testosterone levels (239.76 NG/DL in July 4098) after presenting to his PCP with gynecomastia, and slight elevation of the prolactin at 32.6 NG/mL with inappropriately normal LH at 7.74mIU/mL .  Mammogram 10/16/2021 was unrevealing except for bilateral gynecomastia and no malignancy  Patient has chronic erectile dysfunction for years No personal history of DVT/PE or prostate cancer  Repeat testosterone levels through [facility] were low at 176 NG/DL 04/1912.  Pituitary MRI did not reveal any adenoma   Topical testosterone December 2023   SUBJECTIVE:   During the last visit (04/10/2022): This was a virtual visit    Today (09/24/2022): Chase Clark is here for a follow up on diabetes management.  He is accompanied by his wife Chase Clark today.He checks his glucose multiple times a day through the dexcom   He was evaluated at the kidney/pancreas transplant at [facility] in June 2023, after further evaluation, patient was found to be ineligible   He had pretransplant evaluation by cardiology 02/2022, functional  capacity was consistent with severe functional impairment, there was maximal signs of circulatory limitations to exercise  He was followed by podiatry on 07/03/2022  He developed Acne , on testosterone so he stopped it ~ 3-4  weeks ago   Has chronic loose stools Denies nausea or vomiting   He received intra-articular injection of the hips, but did not help 1/204 Now has back pain   He is on peritoneal dialysis  Extronel during the day 2.5 L  At night uses two bags of 6 Liters 2.5 % dextrose ~ 300 grams of CHO       HOME DIABETES REGIMEN:  Tresiba 40 units BID Humalog 10-15 each meal  NPH 65  units at the start of peritoneal dialysis Testosterone gel 1 pump daily    Statin: yes ACE-I/ARB: yes   CONTINUOUS GLUCOSE MONITORING RECORD INTERPRETATION    Dates of Recording: 5/30 - 09/24/2022  Sensor description: Dexcom  Results statistics:   CGM use % of time 93  Average and SD 197/62  Time in range     47   %  % Time Above 180 36  % Time above 250 16  % Time Below target <1   Glycemic patterns summary: BG is high overnight and trend down during the day  Hyperglycemic episodes at start of dialysis and postprandial  Hypoglycemic episodes occurred n/a  Overnight periods: high        DIABETIC COMPLICATIONS: Microvascular complications:  Neuropathy, CKD IV , 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, + 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  Last Eye Exam: Completed 04/2021  Macrovascular complications:  CAD (S/P CABG 01/2019)  Denies: CVA, PVD   HISTORY:  Past Medical History:  Past Medical History:  Diagnosis Date   Anemia    Arthritis    Asthma    as child   Barrett's esophagus 02/16/2014   short segment   Cataract    Chronic kidney disease (CKD), stage IV (severe) (HCC)    Coronary artery disease    DDD (degenerative disc disease), cervical    Diabetes mellitus without complication (HCC)    Diabetic osteomyelitis (HCC)    Diabetic peripheral neuropathy (HCC)    Diabetic ulcer of foot  with muscle involvement without evidence of necrosis (HCC)    DIABETIC ULCERATIONS ASSOCIATED WITH IRRITATION LATERAL ANKLE LEFT GREATER THAN RIGHT WITH MILD CELLULITIS    Diverticulosis    DKA (diabetic ketoacidoses) 08/23/2017   Edema, lower extremity    Fatty liver    Gastroparesis 2015   GERD (gastroesophageal reflux disease)    Gilbert's disease    Glaucoma    Hx of BKA, left (HCC)    Hx of CABG 01/27/2019   2 vessels; LIMA-LAD, SVG-D1   Hx of heart artery stent 11/20/2009   80% D1 stenosis - 2.5 x 18 mm Xience V Everolimus (DES) stent x 1 placed   Hyperlipidemia    Hypertension    Left below-knee amputee (HCC)    Legally blind    Myocardial infarction (HCC) 11/2009   Neuropathy    Osteoarthritis of spine    Osteomyelitis of left foot (HCC)    Peritoneal dialysis catheter in place [facility])    Does dialysis over night.   PONV (postoperative nausea and vomiting)    PVD (peripheral vascular disease) (HCC)    Seasonal allergies    Shoulder pain    Tubular adenoma of colon    Ulcer of foot (HCC) 11/17/2012   DIABETIC ULCERATIONS ASSOCIATED WITH IRRITATION LATERAL ANKLE LEFT GREATER THAN RIGHT WITH MILD CELLULITIS   Past Surgical History:  Past Surgical History:  Procedure Laterality Date   ABDOMINAL AORTOGRAM N/A 05/20/2016   Procedure: Abdominal Aortogram possible intervention;  Surgeon: Renford Dills, MD;  Location: [facility] INVASIVE CV LAB;  Service: Cardiovascular;  Laterality: N/A;   AMPUTATION Left 09/05/2017   Procedure: AMPUTATION BELOW KNEE;  Surgeon: Jones Broom, MD;  Location: [facility] OR;  Service: Orthopedics;  Laterality: Left;   ANTERIOR CERVICAL DECOMP/DISCECTOMY FUSION N/A 05/07/2017   Procedure: ANTERIOR CERVICAL DECOMPRESSION/DISCECTOMY Patrina Levering PROSTHESIS,PLATE/SCREWS CERVICAL FIVE - CERVICAL SIX;  Surgeon: Tressie Stalker, MD;  Location: [facility] OR;  Service: Neurosurgery;  Laterality: N/A;   CAPD INSERTION N/A 08/30/2020   Procedure: LAPAROSCOPIC  INSERTION CONTINUOUS AMBULATORY PERITONEAL DIALYSIS  (CAPD) CATHETER;  Surgeon: Henrene Dodge, MD;  Location: [facility] ORS;  Service: General;  Laterality: N/A;   CATARACT EXTRACTION W/ INTRAOCULAR LENS IMPLANT     CATARACT EXTRACTION W/PHACO Left 02/26/2017   Procedure: CATARACT EXTRACTION PHACO AND INTRAOCULAR LENS PLACEMENT (IOC);  Surgeon: Nevada Crane, MD;  Location: [facility] ORS;  Service: Ophthalmology;  Laterality: Left;  Lot # [id number] H [place]: 00:25.3 AP%: 6.2 CDE: 1.59    CHOLECYSTECTOMY N/A    COLONOSCOPY WITH PROPOFOL N/A 05/30/2021   Procedure: COLONOSCOPY WITH PROPOFOL;  Surgeon: Beverley Fiedler, MD;  Location: [facility];  Service: Gastroenterology;  Laterality: N/A;   CORONARY ANGIOPLASTY WITH STENT PLACEMENT  11/20/2009   80% D1 stenosis - 2.5 x 18 mm Xience V Everolimus (DES) stent x 1 placed; LOCATION: [facility]; SURGEON: Harold Hedge, MD  CORONARY ARTERY BYPASS GRAFT N/A 01/27/2019   Procedure: OFF PUMP CORONARY ARTERY BYPASS GRAFTING (CABG) X 2 WITH ENDOSCOPIC HARVESTING OF RIGHT GREATER SAPHENOUS VEIN. LIMA TO LAD;  Surgeon: Corliss Skains, MD;  Location: [facility] OR;  Service: Open Heart Surgery;  Laterality: N/A;   CORONARY STENT INTERVENTION N/A 01/21/2019   Procedure: CORONARY STENT INTERVENTION;  Surgeon: Lyn Records, MD;  [facility]: [facility] LAB;  Service: Cardiovascular;  Laterality: N/A;   EPIBLEPHERON REPAIR WITH TEAR DUCT PROBING     EYE SURGERY Right 02/09/2014   cataract extraction   INSERTION EXPRESS TUBE SHUNT Right 09/06/2015   Procedure: INSERTION AHMED TUBE SHUNT with tutoplast allograft;  Surgeon: Nevada Crane, MD;  Location: [facility] ORS;  Service: Ophthalmology;  Laterality: Right;   laparoscopic insertion continuous peritoneal dialysis  2022   LEFT HEART CATH AND CORONARY ANGIOGRAPHY N/A 01/20/2019   Procedure: LEFT HEART CATH AND CORONARY ANGIOGRAPHY;  Surgeon: Runell Gess, MD;  [facility]: [facility];  Service: Cardiovascular;   Laterality: N/A;   POLYPECTOMY  05/30/2021   Procedure: POLYPECTOMY;  Surgeon: Beverley Fiedler, MD;  Location: [facility];  Service: Gastroenterology;;   TEE WITHOUT CARDIOVERSION N/A 01/27/2019   Procedure: TRANSESOPHAGEAL ECHOCARDIOGRAM (TEE);  Surgeon: Corliss Skains, MD;  Location: [facility] OR;  Service: Open Heart Surgery;  Laterality: N/A;   VASECTOMY     Social History:  reports that he quit smoking about 14 years ago. His smoking use included cigarettes. He has a 10.00 pack-year smoking history. He has never used smokeless tobacco. He reports that he does not currently use alcohol. He reports that he does not use drugs. Family History:  Family History  Problem Relation Age of Onset   Hyperlipidemia Mother    Diabetes Mother    Liver disease Mother        NASH   Other Mother        immune thrombocytopenic pupura (ITP)   Hyperlipidemia Father    Diabetes Father    Heart disease Father    Hypertension Father    Lymphoma Father    Hemochromatosis Other        Grandmother (? which side)   Polycythemia Other    Colon cancer Neg Hx    Esophageal cancer Neg Hx    Rectal cancer Neg Hx    Stomach cancer Neg Hx      HOME MEDICATIONS: Allergies as of 09/24/2022   No Known Allergies      Medication List        Accurate as of September 24, 2022 10:19 AM. If you have any questions, ask your nurse or doctor.          acetaminophen 325 MG tablet Commonly known as: TYLENOL Take 1 tablet (325 mg total) by mouth every 6 (six) hours as needed for mild pain (pain score 1-3 or temp > 100.5).   aspirin 81 MG chewable tablet Commonly known as: Aspirin Childrens Chew 1 tablet (81 mg total) by mouth daily.   atorvastatin 80 MG tablet Commonly known as: LIPITOR TAKE 1 TABLET BY MOUTH EVERY DAY What changed: when to take this   azelastine 0.1 % nasal spray Commonly known as: ASTELIN Place 2 sprays into both nostrils 2 (two) times daily. Use in each nostril as directed    brimonidine-timolol 0.2-0.5 % ophthalmic solution Commonly known as: COMBIGAN Place 1 drop into both eyes every 12 (twelve) hours.   Dexcom G6 Transmitter Misc Inject 1 Device into  the skin as directed. Use to check blood sugar at least 4 times daily   Dexcom G7 Sensor Misc USE 1 DEVICE AS DIRECTED   dorzolamide 2 % ophthalmic solution Commonly known as: TRUSOPT Place 1 drop into both eyes 2 (two) times daily.   esomeprazole 20 MG capsule Commonly known as: NEXIUM Take 20 mg by mouth daily.   ezetimibe 10 MG tablet Commonly known as: ZETIA TAKE 1 TABLET BY MOUTH EVERY DAY   Flomax 0.4 MG Caps capsule Generic drug: tamsulosin 0.4 mg at bedtime.   fluticasone 50 MCG/ACT nasal spray Commonly known as: FLONASE SPRAY 2 SPRAYS INTO EACH NOSTRIL EVERY DAY What changed: See the new instructions.   gabapentin 300 MG capsule Commonly known as: NEURONTIN TAKE 1 CAPSULE BY MOUTH TWICE A DAY   gentamicin cream 0.1 % Commonly known as: GARAMYCIN Apply 1 application topically every other day. Around port   HumuLIN N KwikPen 100 UNIT/ML Kiwkpen Generic drug: Insulin NPH (Human) (Isophane) Inject 65 Units into the skin daily in the afternoon. To be taken at the start of dialysis   insulin lispro 100 UNIT/ML KwikPen Commonly known as: HumaLOG KwikPen Max daily 45 units   Insulin Pen Needle 32G X 4 MM Misc Inject 1 Device into the skin 5 (five) times daily. Use to inject Lantus and Novolog up to 5 times daily   Insulin Syringe-Needle U-100 28G X 1/2" 1 ML Misc 1 Device by Does not apply route daily in the afternoon.   irbesartan 75 MG tablet Commonly known as: AVAPRO Take 75 mg by mouth at bedtime.   lactulose 10 GM/15ML solution Commonly known as: CHRONULAC TAKE 30 MLS (20 G TOTAL) BY MOUTH AS NEEDED.   latanoprost 0.005 % ophthalmic solution Commonly known as: XALATAN Place 1 drop into both eyes 2 (two) times daily.   metoCLOPramide 10 MG tablet Commonly known as:  REGLAN Take 1 tablet (10 mg total) by mouth at bedtime. NEEDS OFFICE VISIT FOR FURTHER REFILLS   midodrine 5 MG tablet Commonly known as: PROAMATINE Take 5 mg by mouth 2 (two) times daily as needed (Below 100).   mometasone 0.1 % cream Commonly known as: ELOCON Apply 1 Application topically as directed. Qd to bid to aa right ankle, right lower leg until improved, then prn flares   MULTIVITAMIN ADULT PO Take 1 tablet by mouth daily.   ondansetron 4 MG disintegrating tablet Commonly known as: ZOFRAN-ODT Take 4 mg by mouth every 8 (eight) hours as needed for nausea or vomiting.   PARoxetine 10 MG tablet Commonly known as: PAXIL Take 10 mg by mouth daily.   senna 8.6 MG tablet Commonly known as: SENOKOT Take 2 tablets by mouth daily.   sertraline 25 MG tablet Commonly known as: ZOLOFT Take 25 mg by mouth daily.   torsemide 20 MG tablet Commonly known as: DEMADEX Take 20-40 mg by mouth See admin instructions. Take 40 mg in the morning and 20 mg in the afternoon   Tresiba FlexTouch 200 UNIT/ML FlexTouch Pen Generic drug: insulin degludec INJECT 40 UNITS INTO THE SKIN TWICE A DAY   Vitamin D 50 MCG (2000 UT) Caps Take 2,000 Units by mouth daily.         OBJECTIVE:   Vital Signs: BP 134/80 (BP Location: Left Arm, Patient Position: Sitting, Cuff Size: Large)   Pulse 70   Ht 5\' 10"  (1.778 m)   Wt 266 lb (120.7 kg)   SpO2 96%   BMI 38.17 kg/m  Wt Readings from Last 3 Encounters:  09/24/22 266 lb (120.7 kg)  09/16/22 263 lb (119.3 kg)  02/28/22 241 lb 9.6 oz (109.6 kg)     Exam: General: Pt appears well and is in NAD  Lungs: CTA  Heart: RRR  Neuro: MS is good with appropriate affect, pt is alert and Ox3        DATA REVIEWED:  Lab Results  Component Value Date   HGBA1C 6.8 (A) 09/24/2022   HGBA1C 6.9 (A) 12/06/2021   HGBA1C 6.6 (A) 11/15/2020       ASSESSMENT / PLAN / RECOMMENDATIONS:   1)Type 1 Diabetes Mellitus, optimally controlled, With  ESRD on PD Hx of left BKA, retinopathy and macrovascular complications - Most recent A1c of 6.9 %. Goal A1c < 7.5 %.    -Patient has been noted with hyperglycemia overnight, will adjust his basal as well as NPH dose -His BG's remain optimal during the day, no change to Humalog dose  -Unfortunately, he did not qualify for renal transplant due to severe physical limitation, he is going to start physical therapy -They are interested in weight loss medication, I have researched literature and the only one that has been tested with people on dialysis is liraglutide -Will consider liraglutide  MEDICATIONS: -Increase Tresiba 42 units twice a day  -Continue Humalog 10-15 units with each meal  -Increase NPH 70 units at the beginning of dialysis     EDUCATION / INSTRUCTIONS: BG monitoring instructions: Patient is instructed to check his blood sugars 4 times a day, before meals and bedtime  Call [facility] if: BG persistently < 70  I reviewed the Rule of 15 for the treatment of hypoglycemia in detail with the patient. Literature supplied.   2) Diabetic complications:  Eye: Does  have known diabetic retinopathy.  Neuro/ Feet: Does have known diabetic peripheral neuropathy .  Renal: Patient does  have known baseline CKD. He   is on peritoneal dialysis    3) Hypogonadism:   -He had self discontinued AndroGel due to lack of effectiveness -I did explain to the patient that he was on a small dose, we do not typically start with a higher dose to prevent any complications or side effects -I also discussed the importance of testosterone therapy for bone health and general wellbeing -I did explain to the patient that the dose is not adjusted based on his symptoms but rather based on his numbers as he has multiple comorbidities and his is difficult to ascertain  what is causing his symptoms - Pituitary MRI was negative    Medication  Restart testosterone gel 1 pump to each  shoulder daily   F/U in 6 months    Signed electronically by: Lyndle Herrlich, MD  [facility] Endocrinology  [facility] [address] [place] [place], [place] [address] Phone: [phone] FAX: [phone]   CC: Chase Luis, MD [address] [place] [address] Phone: [phone]  Fax: [phone]  Return to Endocrinology clinic as below: Future Appointments  Date Time Provider Department Center  10/02/2022  3:15 PM Freddie Breech, [place] [facility]  10/06/2022  3:00 PM End, Cristal Deer, MD [facility] None  10/29/2022  3:00 PM Unk Lightning, PA [facility]  12/17/2022  2:00 PM Chase Luis, MD [facility]  10/13/2023  4:00 PM Willeen Niece, MD [facility] None

## 2022-09-24 NOTE — Patient Instructions (Signed)
-   Tresiba  42  units twice a day  - Humalog 10-15 units with each meal  - NPH 70  units with the start of each dialysis   HOW TO TREAT LOW BLOOD SUGARS (Blood sugar LESS THAN 70 MG/DL) Please follow the RULE OF 15 for the treatment of hypoglycemia treatment (when your (blood sugars are less than 70 mg/dL)   STEP 1: Take 15 grams of carbohydrates when your blood sugar is low, which includes:  3-4 GLUCOSE TABS  OR 3-4 OZ OF JUICE OR REGULAR SODA OR ONE TUBE OF GLUCOSE GEL    STEP 2: RECHECK blood sugar in 15 MINUTES STEP 3: If your blood sugar is still low at the 15 minute recheck --> then, go back to STEP 1 and treat AGAIN with another 15 grams of carbohydrates.

## 2022-09-25 ENCOUNTER — Telehealth: Payer: Self-pay | Admitting: Family Medicine

## 2022-09-25 ENCOUNTER — Encounter: Payer: Self-pay | Admitting: Internal Medicine

## 2022-09-25 DIAGNOSIS — Z992 Dependence on renal dialysis: Secondary | ICD-10-CM | POA: Diagnosis not present

## 2022-09-25 DIAGNOSIS — N186 End stage renal disease: Secondary | ICD-10-CM | POA: Diagnosis not present

## 2022-09-25 NOTE — Telephone Encounter (Signed)
Lft pt vm to call ofc to follow up if pt was scheduled.Thanks

## 2022-09-26 ENCOUNTER — Telehealth: Payer: Self-pay | Admitting: Internal Medicine

## 2022-09-26 DIAGNOSIS — Z992 Dependence on renal dialysis: Secondary | ICD-10-CM | POA: Diagnosis not present

## 2022-09-26 DIAGNOSIS — N186 End stage renal disease: Secondary | ICD-10-CM | POA: Diagnosis not present

## 2022-09-26 NOTE — Telephone Encounter (Signed)
Please let the patient/spouse know that I did some research on using weight loss medication in the setting of dialysis   The only one that has been used is Victoza which is a daily injection   This will not replace any of his insulin  Side effect is nausea, and diarrhea, this is why we start a small dose   If they are okay with it would start with Victoza 0.6 mg daily for 4 weeks, then increase to 1.2 mg daily after that   They will need to contact us if he is having hypoglycemia to reduce his insulin   Please let me know if they are okay with it so I can prescribe it   Thanks

## 2022-09-26 NOTE — Telephone Encounter (Signed)
Pt wife returning call. 

## 2022-09-27 DIAGNOSIS — N186 End stage renal disease: Secondary | ICD-10-CM | POA: Diagnosis not present

## 2022-09-27 DIAGNOSIS — Z992 Dependence on renal dialysis: Secondary | ICD-10-CM | POA: Diagnosis not present

## 2022-09-28 DIAGNOSIS — N186 End stage renal disease: Secondary | ICD-10-CM | POA: Diagnosis not present

## 2022-09-28 DIAGNOSIS — Z992 Dependence on renal dialysis: Secondary | ICD-10-CM | POA: Diagnosis not present

## 2022-09-29 ENCOUNTER — Telehealth: Payer: Self-pay

## 2022-09-29 DIAGNOSIS — N186 End stage renal disease: Secondary | ICD-10-CM | POA: Diagnosis not present

## 2022-09-29 DIAGNOSIS — Z992 Dependence on renal dialysis: Secondary | ICD-10-CM | POA: Diagnosis not present

## 2022-09-29 NOTE — Telephone Encounter (Signed)
Advised pt's wife of bx results.  She will call back tomorrow to schedule a surgery for the severe dysplastic on the r spinal mid back./sh

## 2022-09-29 NOTE — Telephone Encounter (Signed)
-----   Message from Willeen Niece, MD sent at 09/29/2022  1:39 PM EDT ----- 1. 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 Skin , vertex scalp CELLULAR BLUE NEVUS, DEEP MARGIN INVOLVED   1. Severely atypical mole- needs excision 2. Benign blue mole, no atypia- will observe  - please call patient

## 2022-09-30 ENCOUNTER — Other Ambulatory Visit: Payer: Self-pay | Admitting: Internal Medicine

## 2022-09-30 ENCOUNTER — Encounter: Payer: Self-pay | Admitting: Internal Medicine

## 2022-09-30 DIAGNOSIS — N186 End stage renal disease: Secondary | ICD-10-CM | POA: Diagnosis not present

## 2022-09-30 DIAGNOSIS — Z992 Dependence on renal dialysis: Secondary | ICD-10-CM | POA: Diagnosis not present

## 2022-09-30 MED ORDER — VICTOZA 18 MG/3ML ~~LOC~~ SOPN: PEN_INJECTOR | SUBCUTANEOUS | 0 refills | Status: DC

## 2022-09-30 NOTE — Telephone Encounter (Addendum)
I spoke to Chase Clark and Chase Clark does want to move forward with the Victoza and will also send blood sugar readings over as well

## 2022-09-30 NOTE — Addendum Note (Signed)
Addended by: Lisabeth Pick on: 09/30/2022 11:12 AM   Modules accepted: Orders

## 2022-09-30 NOTE — Telephone Encounter (Signed)
Patient spouse is aware of directions

## 2022-09-30 NOTE — Addendum Note (Signed)
Addended by: Lisabeth Pick on: 09/30/2022 11:15 AM   Modules accepted: Orders

## 2022-09-30 NOTE — Addendum Note (Signed)
Addended by: Lisabeth Pick on: 09/30/2022 11:14 AM   Modules accepted: Orders

## 2022-09-30 NOTE — Telephone Encounter (Signed)
Medication has been sent to [facility] 

## 2022-09-30 NOTE — Telephone Encounter (Signed)
Pt wife called back , I called her back and left her a detailed voicemail to see if they would like to start on the Victoza.

## 2022-10-01 DIAGNOSIS — N186 End stage renal disease: Secondary | ICD-10-CM | POA: Diagnosis not present

## 2022-10-01 DIAGNOSIS — H4052X3 Glaucoma secondary to other eye disorders, left eye, severe stage: Secondary | ICD-10-CM | POA: Diagnosis not present

## 2022-10-01 DIAGNOSIS — Z992 Dependence on renal dialysis: Secondary | ICD-10-CM | POA: Diagnosis not present

## 2022-10-01 DIAGNOSIS — E113511 Type 2 diabetes mellitus with proliferative diabetic retinopathy with macular edema, right eye: Secondary | ICD-10-CM | POA: Diagnosis not present

## 2022-10-01 DIAGNOSIS — E113513 Type 2 diabetes mellitus with proliferative diabetic retinopathy with macular edema, bilateral: Secondary | ICD-10-CM | POA: Diagnosis not present

## 2022-10-02 ENCOUNTER — Ambulatory Visit (INDEPENDENT_AMBULATORY_CARE_PROVIDER_SITE_OTHER): Payer: Managed Care, Other (non HMO) | Admitting: Podiatry

## 2022-10-02 DIAGNOSIS — N186 End stage renal disease: Secondary | ICD-10-CM | POA: Diagnosis not present

## 2022-10-02 DIAGNOSIS — Z992 Dependence on renal dialysis: Secondary | ICD-10-CM | POA: Diagnosis not present

## 2022-10-02 DIAGNOSIS — Z91199 Patient's noncompliance with other medical treatment and regimen due to unspecified reason: Secondary | ICD-10-CM

## 2022-10-03 DIAGNOSIS — Z992 Dependence on renal dialysis: Secondary | ICD-10-CM | POA: Diagnosis not present

## 2022-10-03 DIAGNOSIS — N186 End stage renal disease: Secondary | ICD-10-CM | POA: Diagnosis not present

## 2022-10-04 DIAGNOSIS — Z992 Dependence on renal dialysis: Secondary | ICD-10-CM | POA: Diagnosis not present

## 2022-10-04 DIAGNOSIS — N186 End stage renal disease: Secondary | ICD-10-CM | POA: Diagnosis not present

## 2022-10-05 DIAGNOSIS — N186 End stage renal disease: Secondary | ICD-10-CM | POA: Diagnosis not present

## 2022-10-05 DIAGNOSIS — Z992 Dependence on renal dialysis: Secondary | ICD-10-CM | POA: Diagnosis not present

## 2022-10-05 NOTE — Progress Notes (Signed)
1. No-show for appointment     

## 2022-10-06 ENCOUNTER — Ambulatory Visit: Payer: Medicare Other | Attending: Internal Medicine | Admitting: Internal Medicine

## 2022-10-06 ENCOUNTER — Encounter: Payer: Self-pay | Admitting: Internal Medicine

## 2022-10-06 VITALS — BP 110/66 | HR 68 | Ht 70.0 in | Wt 268.0 lb

## 2022-10-06 DIAGNOSIS — Z992 Dependence on renal dialysis: Secondary | ICD-10-CM | POA: Diagnosis not present

## 2022-10-06 DIAGNOSIS — I2510 Atherosclerotic heart disease of native coronary artery without angina pectoris: Secondary | ICD-10-CM

## 2022-10-06 DIAGNOSIS — I25118 Atherosclerotic heart disease of native coronary artery with other forms of angina pectoris: Secondary | ICD-10-CM

## 2022-10-06 DIAGNOSIS — N186 End stage renal disease: Secondary | ICD-10-CM | POA: Diagnosis not present

## 2022-10-06 DIAGNOSIS — E785 Hyperlipidemia, unspecified: Secondary | ICD-10-CM | POA: Diagnosis not present

## 2022-10-06 DIAGNOSIS — E1169 Type 2 diabetes mellitus with other specified complication: Secondary | ICD-10-CM

## 2022-10-06 NOTE — Telephone Encounter (Signed)
We need PA processed for the Victoza

## 2022-10-06 NOTE — Patient Instructions (Signed)
Medication Instructions:  Your physician recommends that you continue on your current medications as directed. Please refer to the Current Medication list given to you today.  *If you need a refill on your cardiac medications before your next appointment, please call your pharmacy*   Lab Work: None ordered today   Testing/Procedures: None ordered today   Follow-Up: At [facility], you and your health needs are our priority.  As part of our continuing mission to provide you with exceptional heart care, we have created designated Provider Care Teams.  These Care Teams include your primary Cardiologist (physician) and Advanced Practice Providers (APPs -  Physician Assistants and Nurse Practitioners) who all work together to provide you with the care you need, when you need it.  We recommend signing up for the patient portal called "MyChart".  Sign up information is provided on this After Visit Summary.  MyChart is used to connect with patients for Virtual Visits (Telemedicine).  Patients are able to view lab/test results, encounter notes, upcoming appointments, etc.  Non-urgent messages can be sent to your provider as well.   To learn more about what you can do with MyChart, go to [email or web address].    Your next appointment:   1 month(s)  Provider:   You may see Christopher End, MD or one of the following Advanced Practice Providers on your designated Care Team:   Christopher Berge, NP Ryan Dunn, PA-C Cadence Furth, PA-C Sheri Hammock, NP     

## 2022-10-06 NOTE — Progress Notes (Unsigned)
New Outpatient Visit Date: 10/06/2022  Referring Provider: Glori Luis, MD [address] [place],  [place] [address]  Chief Complaint: Establish cardiology care  HPI:  Mr. Kidney is a 57 y.o. male who is being seen today to establish cardiology care at the request of Dr. Birdie Sons. He has a history of coronary artery disease with history of remote PCI and subsequent CABG in 01/2019 (off-pump LIMA-LAD and SVG-D1) after unsuccessful attempted PCI, hypertension, hyperlipidemia, insulin-dependent diabetes mellitus, Taetum Flewellen-stage renal disease on peritoneal dialysis, left foot wound status post BKA, and Gilbert's syndrome.  He was previously followed by Dr. Lady Gary at [facility], having last been seen in 08/2021, at which time he was doing well.  Today, Mr. Madole reports that he has been feeling fairly well though he has been bothered by right hip and low back pain.  He is undergoing kidney transplant evaluation at [facility] and was noted to have poor exercise tolerance during CPX.  Mr. Nomura attributes this to pain in his right hip and notes that he could have walked farther if necessary.  He denies chest pain and dyspnea though on further questioning, he reports getting out of breath walking between 80 to 100 feet.,  However, his right hip pain is the biggest limiting factor to his mobility.  He endorses occasional palpitations that he sometimes wakes up with at night, typically only lasting for few seconds.  He only has rare episodes of lightheadedness, most commonly when his systolic blood pressure is less than 100 mmHg.  His blood pressures are typically well-controlled though he occasionally notes some spikes.  He is prescribed irbesartan to be used for elevated blood pressure readings but has not needed to take this in over a month.  He has been on peritoneal dialysis for the last 1.5  years.  --------------------------------------------------------------------------------------------------  Past Medical History:  Diagnosis Date   Anemia    Arthritis    Asthma    as child   Atypical mole 09/22/2022   R spinal mid back, needs excision   Barrett's esophagus 02/16/2014   short segment   Cataract    Chronic kidney disease (CKD), stage IV (severe) (HCC)    Coronary artery disease    DDD (degenerative disc disease), cervical    Diabetes mellitus without complication (HCC)    Diabetic osteomyelitis (HCC)    Diabetic peripheral neuropathy (HCC)    Diabetic ulcer of foot with muscle involvement without evidence of necrosis (HCC)    DIABETIC ULCERATIONS ASSOCIATED WITH IRRITATION LATERAL ANKLE LEFT GREATER THAN RIGHT WITH MILD CELLULITIS    Diverticulosis    DKA (diabetic ketoacidoses) 08/23/2017   Edema, lower extremity    Fatty liver    Gastroparesis 2015   GERD (gastroesophageal reflux disease)    Gilbert's disease    Glaucoma    Hx of BKA, left (HCC)    Hx of CABG 01/27/2019   2 vessels; LIMA-LAD, SVG-D1   Hx of heart artery stent 11/20/2009   80% D1 stenosis - 2.5 x 18 mm Xience V Everolimus (DES) stent x 1 placed   Hyperlipidemia    Hypertension    Left below-knee amputee (HCC)    Legally blind    Myocardial infarction (HCC) 11/2009   Neuropathy    Osteoarthritis of spine    Osteomyelitis of left foot (HCC)    Peritoneal dialysis catheter in place [facility])    Does dialysis over night.   PONV (postoperative nausea and vomiting)    PVD (peripheral  vascular disease) (HCC)    Seasonal allergies    Shoulder pain    Tubular adenoma of colon    Ulcer of foot (HCC) 11/17/2012   DIABETIC ULCERATIONS ASSOCIATED WITH IRRITATION LATERAL ANKLE LEFT GREATER THAN RIGHT WITH MILD CELLULITIS    Past Surgical History:  Procedure Laterality Date   ABDOMINAL AORTOGRAM N/A 05/20/2016   Procedure: Abdominal Aortogram possible intervention;  Surgeon: Renford Dills,  MD;  Location: [facility] INVASIVE CV LAB;  Service: Cardiovascular;  Laterality: N/A;   AMPUTATION Left 09/05/2017   Procedure: AMPUTATION BELOW KNEE;  Surgeon: Jones Broom, MD;  Location: [facility] OR;  Service: Orthopedics;  Laterality: Left;   ANTERIOR CERVICAL DECOMP/DISCECTOMY FUSION N/A 05/07/2017   Procedure: ANTERIOR CERVICAL DECOMPRESSION/DISCECTOMY Patrina Levering PROSTHESIS,PLATE/SCREWS CERVICAL FIVE - CERVICAL SIX;  Surgeon: Tressie Stalker, MD;  Location: [facility] OR;  Service: Neurosurgery;  Laterality: N/A;   CAPD INSERTION N/A 08/30/2020   Procedure: LAPAROSCOPIC INSERTION CONTINUOUS AMBULATORY PERITONEAL DIALYSIS  (CAPD) CATHETER;  Surgeon: Henrene Dodge, MD;  Location: [facility] ORS;  Service: General;  Laterality: N/A;   CATARACT EXTRACTION W/ INTRAOCULAR LENS IMPLANT     CATARACT EXTRACTION W/PHACO Left 02/26/2017   Procedure: CATARACT EXTRACTION PHACO AND INTRAOCULAR LENS PLACEMENT (IOC);  Surgeon: Nevada Crane, MD;  Location: [facility] ORS;  Service: Ophthalmology;  Laterality: Left;  Lot # [id number] H [place]: 00:25.3 AP%: 6.2 CDE: 1.59    CHOLECYSTECTOMY N/A    COLONOSCOPY WITH PROPOFOL N/A 05/30/2021   Procedure: COLONOSCOPY WITH PROPOFOL;  Surgeon: Beverley Fiedler, MD;  Location: [facility]OS[facility];  Service: Gastroenterology;  Laterality: N/A;   CORONARY ANGIOPLASTY WITH STENT PLACEMENT  11/20/2009   80% D1 stenosis - 2.5 x 18 mm Xience V Everolimus (DES) stent x 1 placed; LOCATION: [facility]; SURGEON: Harold Hedge, MD   CORONARY ARTERY BYPASS GRAFT N/A 01/27/2019   Procedure: OFF PUMP CORONARY ARTERY BYPASS GRAFTING (CABG) X 2 WITH ENDOSCOPIC HARVESTING OF RIGHT GREATER SAPHENOUS VEIN. LIMA TO LAD;  Surgeon: Corliss Skains, MD;  Location: [facility] OR;  Service: Open Heart Surgery;  Laterality: N/A;   CORONARY STENT INTERVENTION N/A 01/21/2019   Procedure: CORONARY STENT INTERVENTION;  Surgeon: Lyn Records, MD;  Location: [facility];  Service: Cardiovascular;  Laterality: N/A;   EPIBLEPHERON  REPAIR WITH TEAR DUCT PROBING     EYE SURGERY Right 02/09/2014   cataract extraction   INSERTION EXPRESS TUBE SHUNT Right 09/06/2015   Procedure: INSERTION AHMED TUBE SHUNT with tutoplast allograft;  Surgeon: Nevada Crane, MD;  Location: [facility] ORS;  Service: Ophthalmology;  Laterality: Right;   laparoscopic insertion continuous peritoneal dialysis  2022   LEFT HEART CATH AND CORONARY ANGIOGRAPHY N/A 01/20/2019   Procedure: LEFT HEART CATH AND CORONARY ANGIOGRAPHY;  Surgeon: Runell Gess, MD;  Location: [facility];  Service: Cardiovascular;  Laterality: N/A;   POLYPECTOMY  05/30/2021   Procedure: POLYPECTOMY;  Surgeon: Beverley Fiedler, MD;  Location: [facility];  Service: Gastroenterology;;   TEE WITHOUT CARDIOVERSION N/A 01/27/2019   Procedure: TRANSESOPHAGEAL ECHOCARDIOGRAM (TEE);  Surgeon: Corliss Skains, MD;  Location[facility] OR;  Service: Open Heart Surgery;  Laterality: N/A;   VASECTOMY      Current Meds  Medication Sig   acetaminophen (TYLENOL) 325 MG tablet Take 1 tablet (325 mg total) by mouth every 6 (six) hours as needed for mild pain (pain score 1-3 or temp > 100.5).   aspirin (ASPIRIN CHILDRENS) 81 MG chewable tablet Chew 1 tablet (81 mg total) by mouth daily.  atorvastatin (LIPITOR) 80 MG tablet TAKE 1 TABLET BY MOUTH EVERY DAY (Patient taking differently: Take 80 mg by mouth at bedtime.)   brimonidine-timolol (COMBIGAN) 0.2-0.5 % ophthalmic solution Place 1 drop into both eyes every 12 (twelve) hours.   Cholecalciferol (VITAMIN D) 50 MCG (2000 UT) CAPS Take 2,000 Units by mouth daily.   Continuous Glucose Sensor (DEXCOM G7 SENSOR) MISC USE 1 DEVICE AS DIRECTED   dorzolamide (TRUSOPT) 2 % ophthalmic solution Place 1 drop into both eyes 2 (two) times daily.   esomeprazole (NEXIUM) 20 MG capsule Take 20 mg by mouth daily.   ezetimibe (ZETIA) 10 MG tablet TAKE 1 TABLET BY MOUTH EVERY DAY   FLOMAX 0.4 MG CAPS capsule 0.4 mg at bedtime.   gabapentin (NEURONTIN)  300 MG capsule TAKE 1 CAPSULE BY MOUTH TWICE A DAY (Patient taking differently: Take 600 mg by mouth at bedtime.)   gentamicin cream (GARAMYCIN) 0.1 % Apply 1 application topically every other day. Around port   insulin degludec (TRESIBA FLEXTOUCH) 200 UNIT/ML FlexTouch Pen Inject 42 Units into the skin in the morning and at bedtime.   insulin lispro (HUMALOG KWIKPEN) 100 UNIT/ML KwikPen Max daily 45 units (Patient taking differently: Max daily 45 units(Sliding scale))   Insulin NPH, Human,, Isophane, (HUMULIN N KWIKPEN) 100 UNIT/ML Kiwkpen Inject 70 Units into the skin daily in the afternoon. To be taken at the start of dialysis   Insulin Pen Needle 32G X 4 MM MISC Inject 1 Device into the skin 5 (five) times daily. Use to inject Lantus and Novolog up to 5 times daily   lactulose (CHRONULAC) 10 GM/15ML solution TAKE 30 MLS (20 G TOTAL) BY MOUTH AS NEEDED.   metoCLOPramide (REGLAN) 10 MG tablet Take 1 tablet (10 mg total) by mouth at bedtime. NEEDS OFFICE VISIT FOR FURTHER REFILLS   midodrine (PROAMATINE) 5 MG tablet Take 5 mg by mouth 2 (two) times daily as needed (Below 100).   mometasone (ELOCON) 0.1 % cream Apply 1 Application topically as directed. Qd to bid to aa right ankle, right lower leg until improved, then prn flares   Multiple Vitamin (MULTIVITAMIN ADULT PO) Take 1 tablet by mouth daily.   ondansetron (ZOFRAN-ODT) 4 MG disintegrating tablet Take 4 mg by mouth every 8 (eight) hours as needed for nausea or vomiting.   senna (SENOKOT) 8.6 MG tablet Take 2 tablets by mouth daily.   sertraline (ZOLOFT) 25 MG tablet Take 25 mg by mouth daily.   Testosterone 20.25 MG/ACT (1.62%) GEL Place 2 Pump onto the skin daily in the afternoon.   torsemide (DEMADEX) 20 MG tablet Take 20-40 mg by mouth See admin instructions. Take 40 mg in the morning and 40 mg in the afternoon    Allergies: Patient has no known allergies.  Social History   Tobacco Use   Smoking status: Former    Packs/day: 0.50     Years: 20.00    Additional pack years: 0.00    Total pack years: 10.00    Types: Cigarettes    Quit date: 03/14/2008    Years since quitting: 14.5   Smokeless tobacco: Never  Vaping Use   Vaping Use: Never used  Substance Use Topics   Alcohol use: Not Currently    Comment: RARELY   Drug use: No    Family History  Problem Relation Age of Onset   Hyperlipidemia Mother    Diabetes Mother    Liver disease Mother        NASH  Other Mother        immune thrombocytopenic pupura (ITP)   Hyperlipidemia Father    Diabetes Father    Heart disease Father    Hypertension Father    Lymphoma Father    Hemochromatosis Other        Grandmother (? which side)   Polycythemia Other    Colon cancer Neg Hx    Esophageal cancer Neg Hx    Rectal cancer Neg Hx    Stomach cancer Neg Hx     Review of Systems: A 12-system review of systems was performed and was negative except as noted in the HPI.  --------------------------------------------------------------------------------------------------  Physical Exam: BP 110/66 (BP Location: Left Arm, Patient Position: Sitting, Cuff Size: Large)   Pulse 68   Ht 5\' 10"  (1.778 m)   Wt 268 lb (121.6 kg)   SpO2 98%   BMI 38.45 kg/m   General: NAD.  Accompanied by his wife. HEENT: No conjunctival pallor or scleral icterus. Neck: Supple without lymphadenopathy, thyromegaly, JVD, or HJR. No carotid bruit. Lungs: Normal work of breathing. Clear to auscultation bilaterally without wheezes or crackles. Heart: Regular rate and rhythm without murmurs, rubs, or gallops. Non-displaced PMI. Abd: Bowel sounds present. Soft, NT/ND without hepatosplenomegaly Ext: No right lower extremity edema.  Left BKA present with prosthesis in place. Skin: Warm and dry without rash.  EKG: Normal sinus rhythm with nonspecific changes mild QT prolongation (QTc 461 ms).  Lab Results  Component Value Date   WBC 8.6 12/06/2021   HGB 11.9 (L) 12/06/2021   HCT 34.2 (L)  12/06/2021   MCV 97.6 12/06/2021   PLT 150.0 12/06/2021    Lab Results  Component Value Date   NA 136 12/06/2021   K 4.4 12/06/2021   CL 101 12/06/2021   CO2 29 12/06/2021   BUN 33 (H) 12/06/2021   CREATININE 3.66 (H) 12/06/2021   GLUCOSE 163 (H) 12/06/2021   ALT 27 09/16/2022    Lab Results  Component Value Date   CHOL 124 09/16/2022   HDL 27.40 (L) 09/16/2022   LDLCALC 55 05/24/2019   LDLDIRECT 47.0 09/16/2022   TRIG 365.0 (H) 09/16/2022   CHOLHDL 5 09/16/2022     --------------------------------------------------------------------------------------------------  ASSESSMENT AND PLAN: Coronary artery disease with stable angina: Mr. Liby denies any frank chest pain though it is notable that he never had chest pain leading up to his remote PCI and more recent CABG.  He has some exertional dyspnea when walking 80 to 100 feet which could be multifactorial but is possibly an anginal equivalent.  Most recent ischemia evaluation was a pharmacologic MPI at [facility] in 04/2021, which was normal without evidence of ischemia or scar.  He recently demonstrated poor exercise capacity during his CPX, though he attributes this primarily to pain in the hip.  It was recommended that he participate in cardiac rehab to try to help improve his functional capacity.  I will reach out to our cardiac rehab team to see if he would qualify for this.  In the meantime, I have encouraged Mr. Barb to work with physical therapy to help improve his right hip and back pain, as if these continue to be limiting, he would likely not derive much benefit from cardiac rehab.  We will continue aspirin and atorvastatin for secondary prevention.  LDL is well-controlled but triglyceride we could consider if lifestyle modifications do not help improve his triglycerides.  Bruchy Mikel-stage renal disease: Continue peritoneal dialysis and transplant workup per Dr. Thedore Mins and Kateri Mc  transplant team.  Hyperlipidemia associated with type  2 diabetes mellitus: Continue atorvastatin 80 mg daily.  Consider addition of Vascepa if triglycerides do not improve.  Ongoing management of DM per Dr. Birdie Sons.  Follow-up: Return to clinic in 1 month.  Yvonne Kendall, MD 10/06/2022 3:27 PM

## 2022-10-07 ENCOUNTER — Encounter: Payer: Self-pay | Admitting: Internal Medicine

## 2022-10-07 DIAGNOSIS — N186 End stage renal disease: Secondary | ICD-10-CM | POA: Diagnosis not present

## 2022-10-07 DIAGNOSIS — Z992 Dependence on renal dialysis: Secondary | ICD-10-CM | POA: Diagnosis not present

## 2022-10-08 ENCOUNTER — Telehealth: Payer: Self-pay

## 2022-10-08 ENCOUNTER — Other Ambulatory Visit (HOSPITAL_COMMUNITY): Payer: Self-pay

## 2022-10-08 ENCOUNTER — Other Ambulatory Visit: Payer: Self-pay

## 2022-10-08 DIAGNOSIS — N186 End stage renal disease: Secondary | ICD-10-CM | POA: Diagnosis not present

## 2022-10-08 DIAGNOSIS — I2089 Other forms of angina pectoris: Secondary | ICD-10-CM

## 2022-10-08 DIAGNOSIS — Z992 Dependence on renal dialysis: Secondary | ICD-10-CM | POA: Diagnosis not present

## 2022-10-08 NOTE — Telephone Encounter (Signed)
Left vm letting spouse know that request has been sent for the Victoza and I will let her know once I receive determination.

## 2022-10-08 NOTE — Progress Notes (Signed)
Orders placed for Cardiac Rehab as requested by Dr. Okey Dupre.

## 2022-10-08 NOTE — Telephone Encounter (Signed)
Patient Advocate Encounter   Received notification that prior authorization is required for Victoza  Per test claim: Medication is non-formulary. Insurance prefers TRULICITY, BYDUREON Felt, OZEMPIC, or BYETTA  Please advise if medication change is acceptable

## 2022-10-09 ENCOUNTER — Other Ambulatory Visit (HOSPITAL_COMMUNITY): Payer: Self-pay

## 2022-10-09 DIAGNOSIS — N186 End stage renal disease: Secondary | ICD-10-CM | POA: Diagnosis not present

## 2022-10-09 DIAGNOSIS — Z992 Dependence on renal dialysis: Secondary | ICD-10-CM | POA: Diagnosis not present

## 2022-10-09 NOTE — Telephone Encounter (Signed)
Victoza is the only medication he can use in dialysis setting. Please do the PA.

## 2022-10-09 NOTE — Telephone Encounter (Signed)
Patient Advocate Encounter  Prior Authorization for Victoza has been submitted for expedited review  Submitted: 10/09/22 Key [id number]  Status is pending

## 2022-10-10 DIAGNOSIS — Z992 Dependence on renal dialysis: Secondary | ICD-10-CM | POA: Diagnosis not present

## 2022-10-10 DIAGNOSIS — N186 End stage renal disease: Secondary | ICD-10-CM | POA: Diagnosis not present

## 2022-10-10 NOTE — Telephone Encounter (Signed)
Patient spouse states that he is type 2 diabetic and that the Victoza should be covered.

## 2022-10-10 NOTE — Telephone Encounter (Signed)
Left detailed vm and advised to callback or send MyChart with response.

## 2022-10-10 NOTE — Telephone Encounter (Signed)
Pharmacy Patient Advocate Encounter  Received notification that the request for prior authorization for Victoza has been denied

## 2022-10-11 DIAGNOSIS — N186 End stage renal disease: Secondary | ICD-10-CM | POA: Diagnosis not present

## 2022-10-11 DIAGNOSIS — Z992 Dependence on renal dialysis: Secondary | ICD-10-CM | POA: Diagnosis not present

## 2022-10-12 DIAGNOSIS — N186 End stage renal disease: Secondary | ICD-10-CM | POA: Diagnosis not present

## 2022-10-12 DIAGNOSIS — Z992 Dependence on renal dialysis: Secondary | ICD-10-CM | POA: Diagnosis not present

## 2022-10-13 DIAGNOSIS — Z992 Dependence on renal dialysis: Secondary | ICD-10-CM | POA: Diagnosis not present

## 2022-10-13 DIAGNOSIS — N186 End stage renal disease: Secondary | ICD-10-CM | POA: Diagnosis not present

## 2022-10-14 DIAGNOSIS — Z992 Dependence on renal dialysis: Secondary | ICD-10-CM | POA: Diagnosis not present

## 2022-10-14 DIAGNOSIS — N186 End stage renal disease: Secondary | ICD-10-CM | POA: Diagnosis not present

## 2022-10-15 ENCOUNTER — Other Ambulatory Visit: Payer: Self-pay | Admitting: Nephrology

## 2022-10-15 ENCOUNTER — Ambulatory Visit
Admission: RE | Admit: 2022-10-15 | Discharge: 2022-10-15 | Disposition: A | Discharge status: Home / Self Care | Payer: Medicare Other | Attending: Nephrology | Admitting: Nephrology

## 2022-10-15 ENCOUNTER — Ambulatory Visit
Admission: RE | Admit: 2022-10-15 | Discharge: 2022-10-15 | Disposition: A | Discharge status: Home / Self Care | Payer: Medicare Other | Source: Ambulatory Visit | Attending: Nephrology | Admitting: Nephrology

## 2022-10-15 DIAGNOSIS — Z992 Dependence on renal dialysis: Secondary | ICD-10-CM | POA: Diagnosis not present

## 2022-10-15 DIAGNOSIS — N186 End stage renal disease: Secondary | ICD-10-CM | POA: Diagnosis not present

## 2022-10-15 DIAGNOSIS — Z4902 Encounter for fitting and adjustment of peritoneal dialysis catheter: Secondary | ICD-10-CM | POA: Diagnosis not present

## 2022-10-15 DIAGNOSIS — M545 Low back pain, unspecified: Secondary | ICD-10-CM | POA: Diagnosis not present

## 2022-10-15 DIAGNOSIS — T85611A Breakdown (mechanical) of intraperitoneal dialysis catheter, initial encounter: Secondary | ICD-10-CM | POA: Insufficient documentation

## 2022-10-16 ENCOUNTER — Other Ambulatory Visit: Payer: Self-pay | Admitting: Internal Medicine

## 2022-10-16 DIAGNOSIS — N186 End stage renal disease: Secondary | ICD-10-CM | POA: Diagnosis not present

## 2022-10-16 DIAGNOSIS — Z992 Dependence on renal dialysis: Secondary | ICD-10-CM | POA: Diagnosis not present

## 2022-10-17 DIAGNOSIS — Z992 Dependence on renal dialysis: Secondary | ICD-10-CM | POA: Diagnosis not present

## 2022-10-17 DIAGNOSIS — N186 End stage renal disease: Secondary | ICD-10-CM | POA: Diagnosis not present

## 2022-10-18 DIAGNOSIS — N186 End stage renal disease: Secondary | ICD-10-CM | POA: Diagnosis not present

## 2022-10-18 DIAGNOSIS — Z992 Dependence on renal dialysis: Secondary | ICD-10-CM | POA: Diagnosis not present

## 2022-10-19 DIAGNOSIS — N186 End stage renal disease: Secondary | ICD-10-CM | POA: Diagnosis not present

## 2022-10-19 DIAGNOSIS — Z992 Dependence on renal dialysis: Secondary | ICD-10-CM | POA: Diagnosis not present

## 2022-10-20 DIAGNOSIS — Z992 Dependence on renal dialysis: Secondary | ICD-10-CM | POA: Diagnosis not present

## 2022-10-20 DIAGNOSIS — N186 End stage renal disease: Secondary | ICD-10-CM | POA: Diagnosis not present

## 2022-10-20 NOTE — Telephone Encounter (Signed)
Patient spouse is aware that patient is a Type 1 diabetic and that is what he has been seen for.  Patient will continue with his current regiment and doesn't want a referral to weight loss clinic.

## 2022-10-21 DIAGNOSIS — Z992 Dependence on renal dialysis: Secondary | ICD-10-CM | POA: Diagnosis not present

## 2022-10-21 DIAGNOSIS — N186 End stage renal disease: Secondary | ICD-10-CM | POA: Diagnosis not present

## 2022-10-22 DIAGNOSIS — Z992 Dependence on renal dialysis: Secondary | ICD-10-CM | POA: Diagnosis not present

## 2022-10-22 DIAGNOSIS — N186 End stage renal disease: Secondary | ICD-10-CM | POA: Diagnosis not present

## 2022-10-23 DIAGNOSIS — Z992 Dependence on renal dialysis: Secondary | ICD-10-CM | POA: Diagnosis not present

## 2022-10-23 DIAGNOSIS — N186 End stage renal disease: Secondary | ICD-10-CM | POA: Diagnosis not present

## 2022-10-24 DIAGNOSIS — Z992 Dependence on renal dialysis: Secondary | ICD-10-CM | POA: Diagnosis not present

## 2022-10-24 DIAGNOSIS — N186 End stage renal disease: Secondary | ICD-10-CM | POA: Diagnosis not present

## 2022-10-26 DIAGNOSIS — N186 End stage renal disease: Secondary | ICD-10-CM | POA: Diagnosis not present

## 2022-10-26 DIAGNOSIS — Z992 Dependence on renal dialysis: Secondary | ICD-10-CM | POA: Diagnosis not present

## 2022-10-27 ENCOUNTER — Ambulatory Visit (INDEPENDENT_AMBULATORY_CARE_PROVIDER_SITE_OTHER): Payer: Medicare Other | Admitting: Dermatology

## 2022-10-27 VITALS — BP 91/63

## 2022-10-27 DIAGNOSIS — Z992 Dependence on renal dialysis: Secondary | ICD-10-CM | POA: Diagnosis not present

## 2022-10-27 DIAGNOSIS — D225 Melanocytic nevi of trunk: Secondary | ICD-10-CM | POA: Diagnosis not present

## 2022-10-27 DIAGNOSIS — D239 Other benign neoplasm of skin, unspecified: Secondary | ICD-10-CM

## 2022-10-27 DIAGNOSIS — E785 Hyperlipidemia, unspecified: Secondary | ICD-10-CM | POA: Diagnosis not present

## 2022-10-27 DIAGNOSIS — Z5181 Encounter for therapeutic drug level monitoring: Secondary | ICD-10-CM | POA: Diagnosis not present

## 2022-10-27 DIAGNOSIS — Z794 Long term (current) use of insulin: Secondary | ICD-10-CM | POA: Diagnosis not present

## 2022-10-27 DIAGNOSIS — N186 End stage renal disease: Secondary | ICD-10-CM | POA: Diagnosis not present

## 2022-10-27 DIAGNOSIS — E119 Type 2 diabetes mellitus without complications: Secondary | ICD-10-CM | POA: Diagnosis not present

## 2022-10-27 DIAGNOSIS — Z79899 Other long term (current) drug therapy: Secondary | ICD-10-CM | POA: Diagnosis not present

## 2022-10-27 DIAGNOSIS — L988 Other specified disorders of the skin and subcutaneous tissue: Secondary | ICD-10-CM | POA: Diagnosis not present

## 2022-10-27 MED ORDER — MUPIROCIN 2 % EX OINT
1.0000 | TOPICAL_OINTMENT | Freq: Every day | CUTANEOUS | 0 refills | Status: DC
Start: 2022-10-27 — End: 2023-07-30

## 2022-10-27 NOTE — Progress Notes (Signed)
   Follow-Up Visit   Subjective  Chase Clark is a 57 y.o. male who presents for the following: Dysplastic nevus severe, bx proven (R spinal mid back, pt presents for excision).  Patient accompanied by wife who contributes to history/  The following portions of the chart were reviewed this encounter and updated as appropriate:       Review of Systems:  No other skin or systemic complaints except as noted in HPI or Assessment and Plan.  Objective  Well appearing patient in no apparent distress; mood and affect are within normal limits.  A focused examination was performed including back. Relevant physical exam findings are noted in the Assessment and Plan.  R spinal mid back Pink bx site 0.6cm    Assessment & Plan  Dysplastic nevus R spinal mid back  Severe, bx proven, excised today  Skin excision - R spinal mid back  Lesion length (cm):  0.6 Lesion width (cm):  0.6 Margin per side (cm):  0.2 Total excision diameter (cm):  1 Informed consent: discussed and consent obtained   Timeout: patient name, date of birth, surgical site, and procedure verified   Procedure prep:  Patient was prepped and draped in usual sterile fashion Prep type:  Povidone-iodine Anesthesia: the lesion was anesthetized in a standard fashion   Anesthetic:  1% lidocaine w/ epinephrine 1-100,000 buffered w/ 8.4% NaHCO3 (6 cc lido w/ epi, 6cc bupivicaine, Tota of 12cc) Instrument used: #15 blade   Hemostasis achieved with: suture, pressure and electrodesiccation   Outcome: patient tolerated procedure well with no complications   Additional details:  Suture tag at 3:00 lateral.  Skin repair - R spinal mid back Complexity:  Intermediate Final length (cm):  2.2 Informed consent: discussed and consent obtained   Timeout: patient name, date of birth, surgical site, and procedure verified   Reason for type of repair: reduce tension to allow closure, reduce the risk of dehiscence, infection, and  necrosis, reduce subcutaneous dead space and avoid a hematoma, preserve normal anatomical and functional relationships and enhance both functionality and cosmetic results   Undermining: edges undermined   Subcutaneous layers (deep stitches):  Suture size:  3-0 Suture type: Vicryl (polyglactin 910)   Subcutaneous suture technique: inverted dermal. Fine/surface layer approximation (top stitches):  Suture size:  3-0 Suture type: nylon   Stitches: simple interrupted   Suture removal (days):  7 Hemostasis achieved with: suture and pressure Outcome: patient tolerated procedure well with no complications   Post-procedure details: sterile dressing applied and wound care instructions given   Dressing type: pressure dressing (Mupirocin ointment)    mupirocin ointment (BACTROBAN) 2 % - R spinal mid back Apply 1 Application topically daily. Every day to excision site on back  Specimen 1 - Surgical pathology Differential Diagnosis: D18.5 Bx proven Dysplastic Nevus  Check Margins: yes Pink bx site 0.6cm [phone] Suture tag at 3:00 lateral   Return in about 1 week (around 11/03/2022) for suture removal.  I, Sonya Hupman, RMA, am acting as scribe for Willeen Niece, MD .  Documentation: I have reviewed the above documentation for accuracy and completeness, and I agree with the above.  Willeen Niece MD

## 2022-10-27 NOTE — Patient Instructions (Addendum)
Wound Care Instructions  On the day following your surgery, you should begin doing daily dressing changes: Remove the old dressing and discard it. Cleanse the wound gently with tap water. This may be done in the shower or by placing a wet gauze pad directly on the wound and letting it soak for several minutes. It is important to gently remove any dried blood from the wound in order to encourage healing. This may be done by gently rolling a moistened Q-tip on the dried blood. Do not pick at the wound. If the wound should start to bleed, continue cleaning the wound, then place a moist gauze pad on the wound and hold pressure for a few minutes.  Make sure you then dry the skin surrounding the wound completely or the tape will not stick to the skin. Do not use cotton balls on the wound. After the wound is clean and dry, apply the ointment gently with a Q-tip. Cut a non-stick pad to fit the size of the wound. Lay the pad flush to the wound. If the wound is draining, you may want to reinforce it with a small amount of gauze on top of the non-stick pad for a little added compression to the area. Use the tape to seal the area completely. Select from the following with respect to your individual situation: If your wound has been stitched closed: continue the above steps 1-8 at least daily until your sutures are removed. If your wound has been left open to heal: continue steps 1-8 at least daily for the first 3-4 weeks. We would like for you to take a few extra precautions for at least the next week. Sleep with your head elevated on pillows if our wound is on your head. Do not bend over or lift heavy items to reduce the chance of elevated blood pressure to the wound Do not participate in particularly strenuous activities.   Below is a list of dressing supplies you might need.  Cotton-tipped applicators - Q-tips Gauze pads (2x2 and/or 4x4) - All-Purpose Sponges Non-stick dressing material - Telfa Tape -  Paper or Hypafix New and clean tube of petroleum jelly - Vaseline    Comments on Post-Operative Period Slight swelling and redness often appear around the wound. This is normal and will disappear within several days following the surgery. The healing wound will drain a brownish-red-yellow discharge during healing. This is a normal phase of wound healing. As the wound begins to heal, the drainage may increase in amount. Again, this drainage is normal. Notify us if the drainage becomes persistently bloody, excessively swollen, or intensely painful or develops a foul odor or red streaks.  If you should experience mild discomfort during the healing phase, you may take an aspirin-free medication such as Tylenol (acetaminophen). Notify us if the discomfort is severe or persistent. Avoid alcoholic beverages when taking pain medicine.  In Case of Wound Hemorrhage A wound hemorrhage is when the bandage suddenly becomes soaked with bright red blood and flows profusely. If this happens, sit down or lie down with your head elevated. If the wound has a dressing on it, do not remove the dressing. Apply pressure to the existing gauze. If the wound is not covered, use a gauze pad to apply pressure and continue applying the pressure for 20 minutes without peeking. DO NOT COVER THE WOUND WITH A LARGE TOWEL OR WASH CLOTH. Release your hand from the wound site but do not remove the dressing. If the bleeding has stopped,   gently clean around the wound. Leave the dressing in place for 24 hours if possible. This wait time allows the blood vessels to close off so that you do not spark a new round of bleeding by disrupting the newly clotted blood vessels with an immediate dressing change. If the bleeding does not subside, continue to hold pressure. If matters are out of your control, contact an After Hours clinic or go to the Emergency Room.   Due to recent changes in healthcare laws, you may see results of your pathology  and/or laboratory studies on MyChart before the doctors have had a chance to review them. We understand that in some cases there may be results that are confusing or concerning to you. Please understand that not all results are received at the same time and often the doctors may need to interpret multiple results in order to provide you with the best plan of care or course of treatment. Therefore, we ask that you please give us 2 business days to thoroughly review all your results before contacting the office for clarification. Should we see a critical lab result, you will be contacted sooner.   If You Need Anything After Your Visit  If you have any questions or concerns for your doctor, please call our main line at [phone] and press option 4 to reach your doctor's medical assistant. If no one answers, please leave a voicemail as directed and we will return your call as soon as possible. Messages left after 4 pm will be answered the following business day.   You may also send us a message via MyChart. We typically respond to MyChart messages within 1-2 business days.  For prescription refills, please ask your pharmacy to contact our office. Our fax number is [phone].  If you have an urgent issue when the clinic is closed that cannot wait until the next business day, you can page your doctor at the number below.    Please note that while we do our best to be available for urgent issues outside of office hours, we are not available 24/7.   If you have an urgent issue and are unable to reach us, you may choose to seek medical care at your doctor's office, retail clinic, urgent care center, or emergency room.  If you have a medical emergency, please immediately call 911 or go to the emergency department.  Pager Numbers  - Dr. Kowalski: [phone]  - Dr. Moye: [phone]  - Dr. Stewart: [phone]  In the event of inclement weather, please call our main line at [phone] for  an update on the status of any delays or closures.  Dermatology Medication Tips: Please keep the boxes that topical medications come in in order to help keep track of the instructions about where and how to use these. Pharmacies typically print the medication instructions only on the boxes and not directly on the medication tubes.   If your medication is too expensive, please contact our office at [phone] option 4 or send us a message through MyChart.   We are unable to tell what your co-pay for medications will be in advance as this is different depending on your insurance coverage. However, we may be able to find a substitute medication at lower cost or fill out paperwork to get insurance to cover a needed medication.   If a prior authorization is required to get your medication covered by your insurance company, please allow us 1-2 business days to complete this process.    Drug prices often vary depending on where the prescription is filled and some pharmacies may offer cheaper prices.  The website www.goodrx.com contains coupons for medications through different pharmacies. The prices here do not account for what the cost may be with help from insurance (it may be cheaper with your insurance), but the website can give you the price if you did not use any insurance.  - You can print the associated coupon and take it with your prescription to the pharmacy.  - You may also stop by our office during regular business hours and pick up a GoodRx coupon card.  - If you need your prescription sent electronically to a different pharmacy, notify our office through [facility] MyChart or by phone at [phone] option 4.     Si Usted Necesita Algo Despus de Su Visita  Tambin puede enviarnos un mensaje a travs de MyChart. Por lo general respondemos a los mensajes de MyChart en el transcurso de 1 a 2 das hbiles.  Para renovar recetas, por favor pida a su farmacia que se ponga en contacto con  nuestra oficina. Nuestro nmero de fax es el [phone].  Si tiene un asunto urgente cuando la clnica est cerrada y que no puede esperar hasta el siguiente da hbil, puede llamar/localizar a su doctor(a) al nmero que aparece a continuacin.   Por favor, tenga en cuenta que aunque hacemos todo lo posible para estar disponibles para asuntos urgentes fuera del horario de oficina, no estamos disponibles las 24 horas del da, los 7 das de la semana.   Si tiene un problema urgente y no puede comunicarse con nosotros, puede optar por buscar atencin mdica  en el consultorio de su doctor(a), en una clnica privada, en un centro de atencin urgente o en una sala de emergencias.  Si tiene una emergencia mdica, por favor llame inmediatamente al 911 o vaya a la sala de emergencias.  Nmeros de bper  - Dr. Kowalski: [phone]  - Dra. Moye: [phone]  - Dra. Stewart: [phone]  En caso de inclemencias del tiempo, por favor llame a nuestra lnea principal al [phone] para una actualizacin sobre el estado de cualquier retraso o cierre.  Consejos para la medicacin en dermatologa: Por favor, guarde las cajas en las que vienen los medicamentos de uso tpico para ayudarle a seguir las instrucciones sobre dnde y cmo usarlos. Las farmacias generalmente imprimen las instrucciones del medicamento slo en las cajas y no directamente en los tubos del medicamento.   Si su medicamento es muy caro, por favor, pngase en contacto con nuestra oficina llamando al [phone] y presione la opcin 4 o envenos un mensaje a travs de MyChart.   No podemos decirle cul ser su copago por los medicamentos por adelantado ya que esto es diferente dependiendo de la cobertura de su seguro. Sin embargo, es posible que podamos encontrar un medicamento sustituto a menor costo o llenar un formulario para que el seguro cubra el medicamento que se considera necesario.   Si se requiere una autorizacin  previa para que su compaa de seguros cubra su medicamento, por favor permtanos de 1 a 2 das hbiles para completar este proceso.  Los precios de los medicamentos varan con frecuencia dependiendo del lugar de dnde se surte la receta y alguna farmacias pueden ofrecer precios ms baratos.  El sitio web www.goodrx.com tiene cupones para medicamentos de diferentes farmacias. Los precios aqu no tienen en cuenta lo que podra costar con la ayuda del seguro (puede ser ms   barato con su seguro), pero el sitio web puede darle el precio si no utiliz ningn seguro.  - Puede imprimir el cupn correspondiente y llevarlo con su receta a la farmacia.  - Tambin puede pasar por nuestra oficina durante el horario de atencin regular y recoger una tarjeta de cupones de GoodRx.  - Si necesita que su receta se enve electrnicamente a una farmacia diferente, informe a nuestra oficina a travs de MyChart de [facility] o por telfono llamando al [phone] y presione la opcin 4.  

## 2022-10-28 ENCOUNTER — Telehealth: Payer: Self-pay

## 2022-10-28 DIAGNOSIS — N186 End stage renal disease: Secondary | ICD-10-CM | POA: Diagnosis not present

## 2022-10-28 DIAGNOSIS — Z992 Dependence on renal dialysis: Secondary | ICD-10-CM | POA: Diagnosis not present

## 2022-10-28 NOTE — Telephone Encounter (Signed)
Left pt msg to call if any problems after yesterday's surgery./sh 

## 2022-10-29 ENCOUNTER — Ambulatory Visit: Payer: Medicare Other | Admitting: Physician Assistant

## 2022-10-29 DIAGNOSIS — N186 End stage renal disease: Secondary | ICD-10-CM | POA: Diagnosis not present

## 2022-10-29 DIAGNOSIS — Z992 Dependence on renal dialysis: Secondary | ICD-10-CM | POA: Diagnosis not present

## 2022-10-30 DIAGNOSIS — Z992 Dependence on renal dialysis: Secondary | ICD-10-CM | POA: Diagnosis not present

## 2022-10-30 DIAGNOSIS — N186 End stage renal disease: Secondary | ICD-10-CM | POA: Diagnosis not present

## 2022-10-31 DIAGNOSIS — Z992 Dependence on renal dialysis: Secondary | ICD-10-CM | POA: Diagnosis not present

## 2022-10-31 DIAGNOSIS — N186 End stage renal disease: Secondary | ICD-10-CM | POA: Diagnosis not present

## 2022-11-01 DIAGNOSIS — Z992 Dependence on renal dialysis: Secondary | ICD-10-CM | POA: Diagnosis not present

## 2022-11-01 DIAGNOSIS — N186 End stage renal disease: Secondary | ICD-10-CM | POA: Diagnosis not present

## 2022-11-02 DIAGNOSIS — Z992 Dependence on renal dialysis: Secondary | ICD-10-CM | POA: Diagnosis not present

## 2022-11-02 DIAGNOSIS — N186 End stage renal disease: Secondary | ICD-10-CM | POA: Diagnosis not present

## 2022-11-03 ENCOUNTER — Other Ambulatory Visit: Payer: Self-pay

## 2022-11-03 ENCOUNTER — Ambulatory Visit: Payer: Medicare Other | Admitting: Dermatology

## 2022-11-03 DIAGNOSIS — Z992 Dependence on renal dialysis: Secondary | ICD-10-CM | POA: Diagnosis not present

## 2022-11-03 DIAGNOSIS — N186 End stage renal disease: Secondary | ICD-10-CM | POA: Diagnosis not present

## 2022-11-03 NOTE — Progress Notes (Unsigned)
Patient wife called to let Dr Roseanne Reno know patient has been sick all week so he will miss his suture removal appt today, patient will reschedule for thurs July 25 at 1:30 to see the nurse for suture removal

## 2022-11-04 DIAGNOSIS — N186 End stage renal disease: Secondary | ICD-10-CM | POA: Diagnosis not present

## 2022-11-04 DIAGNOSIS — Z992 Dependence on renal dialysis: Secondary | ICD-10-CM | POA: Diagnosis not present

## 2022-11-05 DIAGNOSIS — N186 End stage renal disease: Secondary | ICD-10-CM | POA: Diagnosis not present

## 2022-11-05 DIAGNOSIS — Z992 Dependence on renal dialysis: Secondary | ICD-10-CM | POA: Diagnosis not present

## 2022-11-06 ENCOUNTER — Ambulatory Visit (INDEPENDENT_AMBULATORY_CARE_PROVIDER_SITE_OTHER): Payer: Medicare Other

## 2022-11-06 ENCOUNTER — Telehealth: Payer: Self-pay

## 2022-11-06 DIAGNOSIS — N186 End stage renal disease: Secondary | ICD-10-CM | POA: Diagnosis not present

## 2022-11-06 DIAGNOSIS — Z992 Dependence on renal dialysis: Secondary | ICD-10-CM | POA: Diagnosis not present

## 2022-11-06 DIAGNOSIS — D225 Melanocytic nevi of trunk: Secondary | ICD-10-CM

## 2022-11-06 NOTE — Telephone Encounter (Signed)
ERROR

## 2022-11-06 NOTE — Progress Notes (Unsigned)
Encounter for Removal of Sutures - Incision site at the right spinal mid back is clean, dry and intact - Wound cleansed, sutures removed, wound cleansed and steri strips applied.  - Discussed pathology results showing margins clear.  - Patient advised to keep steri-strips dry until they fall off. - Scars remodel for a full year. - Once steri-strips fall off, patient can apply over-the-counter silicone scar cream each night to help with scar remodeling if desired. - Patient advised to call with any concerns or if they notice any new or changing lesions.  Dorathy Daft, RMA

## 2022-11-07 DIAGNOSIS — N186 End stage renal disease: Secondary | ICD-10-CM | POA: Diagnosis not present

## 2022-11-07 DIAGNOSIS — Z992 Dependence on renal dialysis: Secondary | ICD-10-CM | POA: Diagnosis not present

## 2022-11-08 DIAGNOSIS — N186 End stage renal disease: Secondary | ICD-10-CM | POA: Diagnosis not present

## 2022-11-08 DIAGNOSIS — Z992 Dependence on renal dialysis: Secondary | ICD-10-CM | POA: Diagnosis not present

## 2022-11-09 DIAGNOSIS — Z992 Dependence on renal dialysis: Secondary | ICD-10-CM | POA: Diagnosis not present

## 2022-11-09 DIAGNOSIS — N186 End stage renal disease: Secondary | ICD-10-CM | POA: Diagnosis not present

## 2022-11-10 DIAGNOSIS — Z992 Dependence on renal dialysis: Secondary | ICD-10-CM | POA: Diagnosis not present

## 2022-11-10 DIAGNOSIS — N186 End stage renal disease: Secondary | ICD-10-CM | POA: Diagnosis not present

## 2022-11-10 DIAGNOSIS — M545 Low back pain, unspecified: Secondary | ICD-10-CM | POA: Diagnosis not present

## 2022-11-11 ENCOUNTER — Ambulatory Visit: Payer: Medicare Other | Admitting: Medical

## 2022-11-11 DIAGNOSIS — Z992 Dependence on renal dialysis: Secondary | ICD-10-CM | POA: Diagnosis not present

## 2022-11-11 DIAGNOSIS — N186 End stage renal disease: Secondary | ICD-10-CM | POA: Diagnosis not present

## 2022-11-11 NOTE — Progress Notes (Deleted)
Cardiology Office Note:    Date:  11/11/2022   ID:  Chase Clark, DOB 14-Jun-1965, MRN [id number]  PCP:  Glori Luis, MD  [facility] Cardiologist:  Yvonne Kendall, MD  [facility] Electrophysiologist:  None   Referring MD: Glori Luis, MD   Chief Complaint: 1 month follow-up  History of Present Illness:    Chase Clark is a 57 y.o. male with a hx of CAD with remote PCI and subsequent CABG in October 2020 (LIMA to LAD and SVG to D1) after unsuccessful attempted PCI, hypertension, hyperlipidemia, diabetes type 2, end-stage renal disease on peritoneal dialysis, left foot wound status post BKA, and Gilbert's syndrome.  Patient was previously followed by Dr. Lady Gary at [facility].   Patient recently established with Dr. Okey Dupre in June 2024.  He denied frank chest pain, but reported some exertional dyspnea.  It was recommended patient work through physical therapy for his right hip and back pain.  Cardiac rehab was also discussed.  Today,   Past Medical History:  Diagnosis Date   Anemia    Arthritis    Asthma    as child   Atypical mole 09/22/2022   R spinal mid back, needs excision   Barrett's esophagus 02/16/2014   short segment   Cataract    Chronic kidney disease (CKD), stage IV (severe) (HCC)    Coronary artery disease    DDD (degenerative disc disease), cervical    Diabetes mellitus without complication (HCC)    Diabetic osteomyelitis (HCC)    Diabetic peripheral neuropathy (HCC)    Diabetic ulcer of foot with muscle involvement without evidence of necrosis (HCC)    DIABETIC ULCERATIONS ASSOCIATED WITH IRRITATION LATERAL ANKLE LEFT GREATER THAN RIGHT WITH MILD CELLULITIS    Diverticulosis    DKA (diabetic ketoacidoses) 08/23/2017   Edema, lower extremity    Fatty liver    Gastroparesis 2015   GERD (gastroesophageal reflux disease)    Gilbert's disease    Glaucoma    Hx of BKA, left (HCC)    Hx of CABG 01/27/2019   2 vessels; LIMA-LAD,  SVG-D1   Hx of heart artery stent 11/20/2009   80% D1 stenosis - 2.5 x 18 mm Xience V Everolimus (DES) stent x 1 placed   Hyperlipidemia    Hypertension    Left below-knee amputee (HCC)    Legally blind    Myocardial infarction (HCC) 11/2009   Neuropathy    Osteoarthritis of spine    Osteomyelitis of left foot (HCC)    Peritoneal dialysis catheter in place [facility])    Does dialysis over night.   PONV (postoperative nausea and vomiting)    PVD (peripheral vascular disease) (HCC)    Seasonal allergies    Shoulder pain    Tubular adenoma of colon    Ulcer of foot (HCC) 11/17/2012   DIABETIC ULCERATIONS ASSOCIATED WITH IRRITATION LATERAL ANKLE LEFT GREATER THAN RIGHT WITH MILD CELLULITIS    Past Surgical History:  Procedure Laterality Date   ABDOMINAL AORTOGRAM N/A 05/20/2016   Procedure: Abdominal Aortogram possible intervention;  Surgeon: Renford Dills, MD;  Location: [facility] INVASIVE CV LAB;  Service: Cardiovascular;  Laterality: N/A;   AMPUTATION Left 09/05/2017   Procedure: AMPUTATION BELOW KNEE;  Surgeon: Jones Broom, MD;  Location: [facility] OR;  Service: Orthopedics;  Laterality: Left;   ANTERIOR CERVICAL DECOMP/DISCECTOMY FUSION N/A 05/07/2017   Procedure: ANTERIOR CERVICAL DECOMPRESSION/DISCECTOMY Patrina Levering PROSTHESIS,PLATE/SCREWS CERVICAL FIVE - CERVICAL SIX;  Surgeon: Tressie Stalker, MD;  Location: [facility] OR;  Service: Neurosurgery;  Laterality: N/A;   CAPD INSERTION N/A 08/30/2020   Procedure: LAPAROSCOPIC INSERTION CONTINUOUS AMBULATORY PERITONEAL DIALYSIS  (CAPD) CATHETER;  Surgeon: Henrene Dodge, MD;  Location: [facility] ORS;  Service: General;  Laterality: N/A;   CATARACT EXTRACTION W/ INTRAOCULAR LENS IMPLANT     CATARACT EXTRACTION W/PHACO Left 02/26/2017   Procedure: CATARACT EXTRACTION PHACO AND INTRAOCULAR LENS PLACEMENT (IOC);  Surgeon: Nevada Crane, MD;  Location: [facility] ORS;  Service: Ophthalmology;  Laterality: Left;  Lot # [id number] H [place]: 00:25.3 AP%: 6.2 CDE:  1.59    CHOLECYSTECTOMY N/A    COLONOSCOPY WITH PROPOFOL N/A 05/30/2021   Procedure: COLONOSCOPY WITH PROPOFOL;  Surgeon: Beverley Fiedler, MD;  Location: [facility];  Service: Gastroenterology;  Laterality: N/A;   CORONARY ANGIOPLASTY WITH STENT PLACEMENT  11/20/2009   80% D1 stenosis - 2.5 x 18 mm Xience V Everolimus (DES) stent x 1 placed; LOCATION: [facility]; SURGEON: Harold Hedge, MD   CORONARY ARTERY BYPASS GRAFT N/A 01/27/2019   Procedure: OFF PUMP CORONARY ARTERY BYPASS GRAFTING (CABG) X 2 WITH ENDOSCOPIC HARVESTING OF RIGHT GREATER SAPHENOUS VEIN. LIMA TO LAD;  Surgeon: Corliss Skains, MD;  Location: [facility] OR;  Service: Open Heart Surgery;  Laterality: N/A;   CORONARY STENT INTERVENTION N/A 01/21/2019   Procedure: CORONARY STENT INTERVENTION;  Surgeon: Lyn Records, MD;  Location: [facility];  Service: Cardiovascular;  Laterality: N/A;   EPIBLEPHERON REPAIR WITH TEAR DUCT PROBING     EYE SURGERY Right 02/09/2014   cataract extraction   INSERTION EXPRESS TUBE SHUNT Right 09/06/2015   Procedure: INSERTION AHMED TUBE SHUNT with tutoplast allograft;  Surgeon: Nevada Crane, MD;  Location: [facility] ORS;  Service: Ophthalmology;  Laterality: Right;   laparoscopic insertion continuous peritoneal dialysis  2022   LEFT HEART CATH AND CORONARY ANGIOGRAPHY N/A 01/20/2019   Procedure: LEFT HEART CATH AND CORONARY ANGIOGRAPHY;  Surgeon: Runell Gess, MD;  Location: [facility];  Service: Cardiovascular;  Laterality: N/A;   POLYPECTOMY  05/30/2021   Procedure: POLYPECTOMY;  Surgeon: Beverley Fiedler, MD;  Location: [facility];  Service: Gastroenterology;;   TEE WITHOUT CARDIOVERSION N/A 01/27/2019   Procedure: TRANSESOPHAGEAL ECHOCARDIOGRAM (TEE);  Surgeon: Corliss Skains, MD;  Location: [facility] OR;  Service: Open Heart Surgery;  Laterality: N/A;   VASECTOMY      Current Medications: No outpatient medications have been marked as taking for the 11/11/22 encounter (Appointment)  with Fransico Michael,  H, PA-C.     Allergies:   Patient has no known allergies.   Social History   Socioeconomic History   Marital status: Married    Spouse name: Not on file   Number of children: Not on file   Years of education: Not on file   Highest education level: Not on file  Occupational History   Not on file  Tobacco Use   Smoking status: Former    Current packs/day: 0.00    Average packs/day: 0.5 packs/day for 20.0 years (10.0 ttl pk-yrs)    Types: Cigarettes    Start date: 03/14/1988    Quit date: 03/14/2008    Years since quitting: 14.6   Smokeless tobacco: Never  Vaping Use   Vaping status: Never Used  Substance and Sexual Activity   Alcohol use: Not Currently    Comment: RARELY   Drug use: No   Sexual activity: Not on file  Other Topics Concern   Not on file  Social History Narrative  Used to work as Radiation protection practitioner    Married    Social Determinants of Corporate investment banker Strain: Low Risk  (12/26/2020)   Overall Financial Resource Strain (CARDIA)    Difficulty of Paying Living Expenses: Not hard at all  Food Insecurity: Not on file  Transportation Needs: Not on file  Physical Activity: Not on file  Stress: No Stress Concern Present (12/21/2019)   [employer] of Occupational Health - Occupational Stress Questionnaire    Feeling of Stress : Not at all  Social Connections: Unknown (02/26/2022)   Received from [employer]   Social Network    Social Network: Not on file     Family History: The patient's ***family history includes Diabetes in his father and mother; Heart attack in his father; Hemochromatosis in an other family member; Hyperlipidemia in his father and mother; Hypertension in his father; Liver disease in his mother; Lymphoma in his father; Other in his mother; Polycythemia in an other family member. There is no history of Colon cancer, Esophageal cancer, Rectal cancer, or Stomach cancer.  ROS:   Please see the history of present  illness.    *** All other systems reviewed and are negative.  EKGs/Labs/Other Studies Reviewed:    The following studies were reviewed today: ***  EKG:  EKG is *** ordered today.  The ekg ordered today demonstrates ***  Recent Labs: 12/06/2021: BUN 33; Creatinine, Ser 3.66; Hemoglobin 11.9; Platelets 150.0; Potassium 4.4; Sodium 136; TSH 3.59 09/16/2022: ALT 27  Recent Lipid Panel    Component Value Date/Time   CHOL 124 09/16/2022 0935   TRIG 365.0 (H) 09/16/2022 0935   HDL 27.40 (L) 09/16/2022 0935   CHOLHDL 5 09/16/2022 0935   VLDL 73.0 (H) 09/16/2022 0935   LDLCALC 55 05/24/2019 0905   LDLDIRECT 47.0 09/16/2022 0935     Risk Assessment/Calculations:   {Does this patient have ATRIAL FIBRILLATION?:[phone]}   Physical Exam:    VS:  There were no vitals taken for this visit.    Wt Readings from Last 3 Encounters:  10/06/22 268 lb (121.6 kg)  09/24/22 266 lb (120.7 kg)  09/16/22 263 lb (119.3 kg)     GEN: *** Well nourished, well developed in no acute distress HEENT: Normal NECK: No JVD; No carotid bruits LYMPHATICS: No lymphadenopathy CARDIAC: ***RRR, no murmurs, rubs, gallops RESPIRATORY:  Clear to auscultation without rales, wheezing or rhonchi  ABDOMEN: Soft, non-tender, non-distended MUSCULOSKELETAL:  No edema; No deformity  SKIN: Warm and dry NEUROLOGIC:  Alert and oriented x 3 PSYCHIATRIC:  Normal affect   ASSESSMENT:    No diagnosis found. PLAN:    In order of problems listed above:  ***  Disposition: Follow up {follow up:15908} with ***   Shared Decision Making/Informed Consent   {Are you ordering a CV Procedure (e.g. stress test, cath, DCCV, TEE, etc)?   Press F2        :[id number]}    Signed,  Ardelle Lesches  11/11/2022 12:31 PM    [facility]

## 2022-11-12 DIAGNOSIS — N186 End stage renal disease: Secondary | ICD-10-CM | POA: Diagnosis not present

## 2022-11-12 DIAGNOSIS — Z992 Dependence on renal dialysis: Secondary | ICD-10-CM | POA: Diagnosis not present

## 2022-11-13 DIAGNOSIS — N186 End stage renal disease: Secondary | ICD-10-CM | POA: Diagnosis not present

## 2022-11-13 DIAGNOSIS — Z992 Dependence on renal dialysis: Secondary | ICD-10-CM | POA: Diagnosis not present

## 2022-11-13 DIAGNOSIS — M545 Low back pain, unspecified: Secondary | ICD-10-CM | POA: Diagnosis not present

## 2022-11-14 DIAGNOSIS — Z992 Dependence on renal dialysis: Secondary | ICD-10-CM | POA: Diagnosis not present

## 2022-11-14 DIAGNOSIS — N186 End stage renal disease: Secondary | ICD-10-CM | POA: Diagnosis not present

## 2022-11-15 DIAGNOSIS — Z992 Dependence on renal dialysis: Secondary | ICD-10-CM | POA: Diagnosis not present

## 2022-11-15 DIAGNOSIS — N186 End stage renal disease: Secondary | ICD-10-CM | POA: Diagnosis not present

## 2022-11-16 DIAGNOSIS — N186 End stage renal disease: Secondary | ICD-10-CM | POA: Diagnosis not present

## 2022-11-16 DIAGNOSIS — Z992 Dependence on renal dialysis: Secondary | ICD-10-CM | POA: Diagnosis not present

## 2022-11-17 DIAGNOSIS — Z992 Dependence on renal dialysis: Secondary | ICD-10-CM | POA: Diagnosis not present

## 2022-11-17 DIAGNOSIS — M545 Low back pain, unspecified: Secondary | ICD-10-CM | POA: Diagnosis not present

## 2022-11-17 DIAGNOSIS — N186 End stage renal disease: Secondary | ICD-10-CM | POA: Diagnosis not present

## 2022-11-18 DIAGNOSIS — N186 End stage renal disease: Secondary | ICD-10-CM | POA: Diagnosis not present

## 2022-11-18 DIAGNOSIS — Z992 Dependence on renal dialysis: Secondary | ICD-10-CM | POA: Diagnosis not present

## 2022-11-19 DIAGNOSIS — N186 End stage renal disease: Secondary | ICD-10-CM | POA: Diagnosis not present

## 2022-11-19 DIAGNOSIS — Z992 Dependence on renal dialysis: Secondary | ICD-10-CM | POA: Diagnosis not present

## 2022-11-20 DIAGNOSIS — Z992 Dependence on renal dialysis: Secondary | ICD-10-CM | POA: Diagnosis not present

## 2022-11-20 DIAGNOSIS — N186 End stage renal disease: Secondary | ICD-10-CM | POA: Diagnosis not present

## 2022-11-21 DIAGNOSIS — Z992 Dependence on renal dialysis: Secondary | ICD-10-CM | POA: Diagnosis not present

## 2022-11-21 DIAGNOSIS — N186 End stage renal disease: Secondary | ICD-10-CM | POA: Diagnosis not present

## 2022-11-22 DIAGNOSIS — Z992 Dependence on renal dialysis: Secondary | ICD-10-CM | POA: Diagnosis not present

## 2022-11-22 DIAGNOSIS — N186 End stage renal disease: Secondary | ICD-10-CM | POA: Diagnosis not present

## 2022-11-23 DIAGNOSIS — N186 End stage renal disease: Secondary | ICD-10-CM | POA: Diagnosis not present

## 2022-11-23 DIAGNOSIS — Z992 Dependence on renal dialysis: Secondary | ICD-10-CM | POA: Diagnosis not present

## 2022-11-24 DIAGNOSIS — M545 Low back pain, unspecified: Secondary | ICD-10-CM | POA: Diagnosis not present

## 2022-11-24 DIAGNOSIS — Z992 Dependence on renal dialysis: Secondary | ICD-10-CM | POA: Diagnosis not present

## 2022-11-24 DIAGNOSIS — N186 End stage renal disease: Secondary | ICD-10-CM | POA: Diagnosis not present

## 2022-11-25 DIAGNOSIS — N186 End stage renal disease: Secondary | ICD-10-CM | POA: Diagnosis not present

## 2022-11-25 DIAGNOSIS — Z992 Dependence on renal dialysis: Secondary | ICD-10-CM | POA: Diagnosis not present

## 2022-11-26 DIAGNOSIS — N186 End stage renal disease: Secondary | ICD-10-CM | POA: Diagnosis not present

## 2022-11-26 DIAGNOSIS — Z992 Dependence on renal dialysis: Secondary | ICD-10-CM | POA: Diagnosis not present

## 2022-11-27 ENCOUNTER — Telehealth: Payer: Self-pay | Admitting: Internal Medicine

## 2022-11-27 ENCOUNTER — Encounter (INDEPENDENT_AMBULATORY_CARE_PROVIDER_SITE_OTHER): Payer: Self-pay

## 2022-11-27 DIAGNOSIS — M545 Low back pain, unspecified: Secondary | ICD-10-CM | POA: Diagnosis not present

## 2022-11-27 DIAGNOSIS — N186 End stage renal disease: Secondary | ICD-10-CM | POA: Diagnosis not present

## 2022-11-27 DIAGNOSIS — Z992 Dependence on renal dialysis: Secondary | ICD-10-CM | POA: Diagnosis not present

## 2022-11-27 MED ORDER — METOCLOPRAMIDE HCL 10 MG PO TABS: 10.0000 mg | ORAL_TABLET | Freq: Every day | ORAL | 0 refills | Status: DC

## 2022-11-27 NOTE — Telephone Encounter (Signed)
Inbound call from patient's wife requesting a refill for metoclopramide medication. Patient's wife stated he has only been without it for a few days and is feeling very sick. Patient is scheduled for 10/25. Please advise, thank you.

## 2022-11-28 DIAGNOSIS — Z992 Dependence on renal dialysis: Secondary | ICD-10-CM | POA: Diagnosis not present

## 2022-11-28 DIAGNOSIS — N186 End stage renal disease: Secondary | ICD-10-CM | POA: Diagnosis not present

## 2022-11-29 DIAGNOSIS — Z992 Dependence on renal dialysis: Secondary | ICD-10-CM | POA: Diagnosis not present

## 2022-11-29 DIAGNOSIS — N186 End stage renal disease: Secondary | ICD-10-CM | POA: Diagnosis not present

## 2022-11-30 DIAGNOSIS — Z992 Dependence on renal dialysis: Secondary | ICD-10-CM | POA: Diagnosis not present

## 2022-11-30 DIAGNOSIS — N186 End stage renal disease: Secondary | ICD-10-CM | POA: Diagnosis not present

## 2022-12-01 DIAGNOSIS — Z992 Dependence on renal dialysis: Secondary | ICD-10-CM | POA: Diagnosis not present

## 2022-12-01 DIAGNOSIS — N186 End stage renal disease: Secondary | ICD-10-CM | POA: Diagnosis not present

## 2022-12-02 DIAGNOSIS — N186 End stage renal disease: Secondary | ICD-10-CM | POA: Diagnosis not present

## 2022-12-02 DIAGNOSIS — Z992 Dependence on renal dialysis: Secondary | ICD-10-CM | POA: Diagnosis not present

## 2022-12-03 ENCOUNTER — Telehealth: Payer: Self-pay

## 2022-12-03 DIAGNOSIS — N186 End stage renal disease: Secondary | ICD-10-CM | POA: Diagnosis not present

## 2022-12-03 DIAGNOSIS — Z992 Dependence on renal dialysis: Secondary | ICD-10-CM | POA: Diagnosis not present

## 2022-12-03 NOTE — Telephone Encounter (Signed)
Patient spouse advised again that Dr. Lonzo Cloud sees him for Type 1 diabetes and insurance will not cover Victoza because he's not Type 2.

## 2022-12-04 DIAGNOSIS — Z992 Dependence on renal dialysis: Secondary | ICD-10-CM | POA: Diagnosis not present

## 2022-12-04 DIAGNOSIS — N186 End stage renal disease: Secondary | ICD-10-CM | POA: Diagnosis not present

## 2022-12-05 DIAGNOSIS — N186 End stage renal disease: Secondary | ICD-10-CM | POA: Diagnosis not present

## 2022-12-05 DIAGNOSIS — Z992 Dependence on renal dialysis: Secondary | ICD-10-CM | POA: Diagnosis not present

## 2022-12-06 DIAGNOSIS — Z992 Dependence on renal dialysis: Secondary | ICD-10-CM | POA: Diagnosis not present

## 2022-12-06 DIAGNOSIS — S52602A Unspecified fracture of lower end of left ulna, initial encounter for closed fracture: Secondary | ICD-10-CM | POA: Diagnosis not present

## 2022-12-06 DIAGNOSIS — N186 End stage renal disease: Secondary | ICD-10-CM | POA: Diagnosis not present

## 2022-12-06 DIAGNOSIS — S52502A Unspecified fracture of the lower end of left radius, initial encounter for closed fracture: Secondary | ICD-10-CM | POA: Diagnosis not present

## 2022-12-07 DIAGNOSIS — Z992 Dependence on renal dialysis: Secondary | ICD-10-CM | POA: Diagnosis not present

## 2022-12-07 DIAGNOSIS — N186 End stage renal disease: Secondary | ICD-10-CM | POA: Diagnosis not present

## 2022-12-08 DIAGNOSIS — Z992 Dependence on renal dialysis: Secondary | ICD-10-CM | POA: Diagnosis not present

## 2022-12-08 DIAGNOSIS — N186 End stage renal disease: Secondary | ICD-10-CM | POA: Diagnosis not present

## 2022-12-09 DIAGNOSIS — Z992 Dependence on renal dialysis: Secondary | ICD-10-CM | POA: Diagnosis not present

## 2022-12-09 DIAGNOSIS — S52502D Unspecified fracture of the lower end of left radius, subsequent encounter for closed fracture with routine healing: Secondary | ICD-10-CM | POA: Diagnosis not present

## 2022-12-09 DIAGNOSIS — N186 End stage renal disease: Secondary | ICD-10-CM | POA: Diagnosis not present

## 2022-12-10 DIAGNOSIS — Z992 Dependence on renal dialysis: Secondary | ICD-10-CM | POA: Diagnosis not present

## 2022-12-10 DIAGNOSIS — N186 End stage renal disease: Secondary | ICD-10-CM | POA: Diagnosis not present

## 2022-12-11 DIAGNOSIS — Z992 Dependence on renal dialysis: Secondary | ICD-10-CM | POA: Diagnosis not present

## 2022-12-11 DIAGNOSIS — N186 End stage renal disease: Secondary | ICD-10-CM | POA: Diagnosis not present

## 2022-12-12 DIAGNOSIS — Z992 Dependence on renal dialysis: Secondary | ICD-10-CM | POA: Diagnosis not present

## 2022-12-12 DIAGNOSIS — N186 End stage renal disease: Secondary | ICD-10-CM | POA: Diagnosis not present

## 2022-12-13 DIAGNOSIS — Z992 Dependence on renal dialysis: Secondary | ICD-10-CM | POA: Diagnosis not present

## 2022-12-13 DIAGNOSIS — N186 End stage renal disease: Secondary | ICD-10-CM | POA: Diagnosis not present

## 2022-12-14 DIAGNOSIS — N186 End stage renal disease: Secondary | ICD-10-CM | POA: Diagnosis not present

## 2022-12-14 DIAGNOSIS — Z992 Dependence on renal dialysis: Secondary | ICD-10-CM | POA: Diagnosis not present

## 2022-12-15 DIAGNOSIS — Z992 Dependence on renal dialysis: Secondary | ICD-10-CM | POA: Diagnosis not present

## 2022-12-15 DIAGNOSIS — N186 End stage renal disease: Secondary | ICD-10-CM | POA: Diagnosis not present

## 2022-12-16 ENCOUNTER — Other Ambulatory Visit: Payer: Self-pay | Admitting: Family Medicine

## 2022-12-16 DIAGNOSIS — Z992 Dependence on renal dialysis: Secondary | ICD-10-CM | POA: Diagnosis not present

## 2022-12-16 DIAGNOSIS — N186 End stage renal disease: Secondary | ICD-10-CM | POA: Diagnosis not present

## 2022-12-17 ENCOUNTER — Ambulatory Visit: Payer: Medicare Other | Admitting: Family Medicine

## 2022-12-17 ENCOUNTER — Other Ambulatory Visit: Payer: Self-pay | Admitting: Internal Medicine

## 2022-12-17 DIAGNOSIS — Z992 Dependence on renal dialysis: Secondary | ICD-10-CM | POA: Diagnosis not present

## 2022-12-17 DIAGNOSIS — N186 End stage renal disease: Secondary | ICD-10-CM | POA: Diagnosis not present

## 2022-12-18 DIAGNOSIS — Z992 Dependence on renal dialysis: Secondary | ICD-10-CM | POA: Diagnosis not present

## 2022-12-18 DIAGNOSIS — N186 End stage renal disease: Secondary | ICD-10-CM | POA: Diagnosis not present

## 2022-12-18 DIAGNOSIS — S52502D Unspecified fracture of the lower end of left radius, subsequent encounter for closed fracture with routine healing: Secondary | ICD-10-CM | POA: Diagnosis not present

## 2022-12-19 DIAGNOSIS — Z992 Dependence on renal dialysis: Secondary | ICD-10-CM | POA: Diagnosis not present

## 2022-12-19 DIAGNOSIS — N186 End stage renal disease: Secondary | ICD-10-CM | POA: Diagnosis not present

## 2022-12-20 DIAGNOSIS — Z992 Dependence on renal dialysis: Secondary | ICD-10-CM | POA: Diagnosis not present

## 2022-12-20 DIAGNOSIS — N186 End stage renal disease: Secondary | ICD-10-CM | POA: Diagnosis not present

## 2022-12-21 DIAGNOSIS — N186 End stage renal disease: Secondary | ICD-10-CM | POA: Diagnosis not present

## 2022-12-21 DIAGNOSIS — Z992 Dependence on renal dialysis: Secondary | ICD-10-CM | POA: Diagnosis not present

## 2022-12-22 DIAGNOSIS — Z992 Dependence on renal dialysis: Secondary | ICD-10-CM | POA: Diagnosis not present

## 2022-12-22 DIAGNOSIS — N186 End stage renal disease: Secondary | ICD-10-CM | POA: Diagnosis not present

## 2022-12-23 DIAGNOSIS — N186 End stage renal disease: Secondary | ICD-10-CM | POA: Diagnosis not present

## 2022-12-23 DIAGNOSIS — S52502D Unspecified fracture of the lower end of left radius, subsequent encounter for closed fracture with routine healing: Secondary | ICD-10-CM | POA: Diagnosis not present

## 2022-12-23 DIAGNOSIS — Z992 Dependence on renal dialysis: Secondary | ICD-10-CM | POA: Diagnosis not present

## 2022-12-24 DIAGNOSIS — N186 End stage renal disease: Secondary | ICD-10-CM | POA: Diagnosis not present

## 2022-12-24 DIAGNOSIS — Z992 Dependence on renal dialysis: Secondary | ICD-10-CM | POA: Diagnosis not present

## 2022-12-25 DIAGNOSIS — Z992 Dependence on renal dialysis: Secondary | ICD-10-CM | POA: Diagnosis not present

## 2022-12-25 DIAGNOSIS — N186 End stage renal disease: Secondary | ICD-10-CM | POA: Diagnosis not present

## 2022-12-26 ENCOUNTER — Other Ambulatory Visit: Payer: Self-pay

## 2022-12-26 ENCOUNTER — Telehealth: Payer: Self-pay | Admitting: Internal Medicine

## 2022-12-26 DIAGNOSIS — N186 End stage renal disease: Secondary | ICD-10-CM | POA: Diagnosis not present

## 2022-12-26 DIAGNOSIS — Z992 Dependence on renal dialysis: Secondary | ICD-10-CM | POA: Diagnosis not present

## 2022-12-26 MED ORDER — METOCLOPRAMIDE HCL 10 MG PO TABS: 10.0000 mg | ORAL_TABLET | Freq: Every day | ORAL | 0 refills | Status: DC

## 2022-12-26 NOTE — Telephone Encounter (Signed)
Patients wife called stated the patient has a broken wrist in 3 places and has a lot going on at this time. She would like to know if the follow up appt with Willette Cluster NP can be done virtually. Also stated he will be out of his Reglan medication in two days. Would like a refill until the appt if possible.

## 2022-12-27 DIAGNOSIS — Z992 Dependence on renal dialysis: Secondary | ICD-10-CM | POA: Diagnosis not present

## 2022-12-27 DIAGNOSIS — N186 End stage renal disease: Secondary | ICD-10-CM | POA: Diagnosis not present

## 2022-12-28 DIAGNOSIS — Z992 Dependence on renal dialysis: Secondary | ICD-10-CM | POA: Diagnosis not present

## 2022-12-28 DIAGNOSIS — N186 End stage renal disease: Secondary | ICD-10-CM | POA: Diagnosis not present

## 2022-12-29 DIAGNOSIS — N186 End stage renal disease: Secondary | ICD-10-CM | POA: Diagnosis not present

## 2022-12-29 DIAGNOSIS — Z992 Dependence on renal dialysis: Secondary | ICD-10-CM | POA: Diagnosis not present

## 2022-12-30 DIAGNOSIS — Z992 Dependence on renal dialysis: Secondary | ICD-10-CM | POA: Diagnosis not present

## 2022-12-30 DIAGNOSIS — N186 End stage renal disease: Secondary | ICD-10-CM | POA: Diagnosis not present

## 2022-12-31 ENCOUNTER — Ambulatory Visit: Payer: Medicare Other | Admitting: Internal Medicine

## 2022-12-31 DIAGNOSIS — Z992 Dependence on renal dialysis: Secondary | ICD-10-CM | POA: Diagnosis not present

## 2022-12-31 DIAGNOSIS — N186 End stage renal disease: Secondary | ICD-10-CM | POA: Diagnosis not present

## 2023-01-01 DIAGNOSIS — S52502D Unspecified fracture of the lower end of left radius, subsequent encounter for closed fracture with routine healing: Secondary | ICD-10-CM | POA: Diagnosis not present

## 2023-01-01 DIAGNOSIS — N186 End stage renal disease: Secondary | ICD-10-CM | POA: Diagnosis not present

## 2023-01-01 DIAGNOSIS — Z992 Dependence on renal dialysis: Secondary | ICD-10-CM | POA: Diagnosis not present

## 2023-01-01 NOTE — Telephone Encounter (Signed)
Left message for patient or Chase Clark to return call to the office.  Will continue efforts.

## 2023-01-02 DIAGNOSIS — N186 End stage renal disease: Secondary | ICD-10-CM | POA: Diagnosis not present

## 2023-01-02 DIAGNOSIS — Z992 Dependence on renal dialysis: Secondary | ICD-10-CM | POA: Diagnosis not present

## 2023-01-02 NOTE — Telephone Encounter (Signed)
Left message for patient or Chase Clark to return call to the office.  Will continue efforts.

## 2023-01-03 DIAGNOSIS — N186 End stage renal disease: Secondary | ICD-10-CM | POA: Diagnosis not present

## 2023-01-03 DIAGNOSIS — Z992 Dependence on renal dialysis: Secondary | ICD-10-CM | POA: Diagnosis not present

## 2023-01-04 DIAGNOSIS — Z992 Dependence on renal dialysis: Secondary | ICD-10-CM | POA: Diagnosis not present

## 2023-01-04 DIAGNOSIS — N186 End stage renal disease: Secondary | ICD-10-CM | POA: Diagnosis not present

## 2023-01-05 DIAGNOSIS — Z992 Dependence on renal dialysis: Secondary | ICD-10-CM | POA: Diagnosis not present

## 2023-01-05 DIAGNOSIS — N186 End stage renal disease: Secondary | ICD-10-CM | POA: Diagnosis not present

## 2023-01-06 DIAGNOSIS — N186 End stage renal disease: Secondary | ICD-10-CM | POA: Diagnosis not present

## 2023-01-06 DIAGNOSIS — Z992 Dependence on renal dialysis: Secondary | ICD-10-CM | POA: Diagnosis not present

## 2023-01-07 ENCOUNTER — Ambulatory Visit: Payer: Medicare Other | Admitting: Family Medicine

## 2023-01-07 DIAGNOSIS — N186 End stage renal disease: Secondary | ICD-10-CM | POA: Diagnosis not present

## 2023-01-07 DIAGNOSIS — Z992 Dependence on renal dialysis: Secondary | ICD-10-CM | POA: Diagnosis not present

## 2023-01-08 DIAGNOSIS — N186 End stage renal disease: Secondary | ICD-10-CM | POA: Diagnosis not present

## 2023-01-08 DIAGNOSIS — Z992 Dependence on renal dialysis: Secondary | ICD-10-CM | POA: Diagnosis not present

## 2023-01-08 NOTE — Telephone Encounter (Signed)
Left message for Chase Clark to return call to the office to let us know if she wants to do a virtual appointment or leave as an in person visit.  MyChart message was also sent to patient.

## 2023-01-09 DIAGNOSIS — N186 End stage renal disease: Secondary | ICD-10-CM | POA: Diagnosis not present

## 2023-01-09 DIAGNOSIS — Z992 Dependence on renal dialysis: Secondary | ICD-10-CM | POA: Diagnosis not present

## 2023-01-10 DIAGNOSIS — Z992 Dependence on renal dialysis: Secondary | ICD-10-CM | POA: Diagnosis not present

## 2023-01-10 DIAGNOSIS — N186 End stage renal disease: Secondary | ICD-10-CM | POA: Diagnosis not present

## 2023-01-11 DIAGNOSIS — N186 End stage renal disease: Secondary | ICD-10-CM | POA: Diagnosis not present

## 2023-01-11 DIAGNOSIS — Z992 Dependence on renal dialysis: Secondary | ICD-10-CM | POA: Diagnosis not present

## 2023-01-12 ENCOUNTER — Ambulatory Visit: Payer: Medicare Other | Admitting: Cardiology

## 2023-01-12 DIAGNOSIS — N186 End stage renal disease: Secondary | ICD-10-CM | POA: Diagnosis not present

## 2023-01-12 DIAGNOSIS — Z992 Dependence on renal dialysis: Secondary | ICD-10-CM | POA: Diagnosis not present

## 2023-01-13 DIAGNOSIS — Z23 Encounter for immunization: Secondary | ICD-10-CM | POA: Diagnosis not present

## 2023-01-13 DIAGNOSIS — Z992 Dependence on renal dialysis: Secondary | ICD-10-CM | POA: Diagnosis not present

## 2023-01-13 DIAGNOSIS — N186 End stage renal disease: Secondary | ICD-10-CM | POA: Diagnosis not present

## 2023-01-14 DIAGNOSIS — N186 End stage renal disease: Secondary | ICD-10-CM | POA: Diagnosis not present

## 2023-01-14 DIAGNOSIS — Z992 Dependence on renal dialysis: Secondary | ICD-10-CM | POA: Diagnosis not present

## 2023-01-14 DIAGNOSIS — Z23 Encounter for immunization: Secondary | ICD-10-CM | POA: Diagnosis not present

## 2023-01-16 DIAGNOSIS — Z992 Dependence on renal dialysis: Secondary | ICD-10-CM | POA: Diagnosis not present

## 2023-01-16 DIAGNOSIS — Z23 Encounter for immunization: Secondary | ICD-10-CM | POA: Diagnosis not present

## 2023-01-16 DIAGNOSIS — N186 End stage renal disease: Secondary | ICD-10-CM | POA: Diagnosis not present

## 2023-01-17 DIAGNOSIS — Z992 Dependence on renal dialysis: Secondary | ICD-10-CM | POA: Diagnosis not present

## 2023-01-17 DIAGNOSIS — Z23 Encounter for immunization: Secondary | ICD-10-CM | POA: Diagnosis not present

## 2023-01-17 DIAGNOSIS — N186 End stage renal disease: Secondary | ICD-10-CM | POA: Diagnosis not present

## 2023-01-18 DIAGNOSIS — Z992 Dependence on renal dialysis: Secondary | ICD-10-CM | POA: Diagnosis not present

## 2023-01-18 DIAGNOSIS — N186 End stage renal disease: Secondary | ICD-10-CM | POA: Diagnosis not present

## 2023-01-18 DIAGNOSIS — Z23 Encounter for immunization: Secondary | ICD-10-CM | POA: Diagnosis not present

## 2023-01-19 DIAGNOSIS — Z23 Encounter for immunization: Secondary | ICD-10-CM | POA: Diagnosis not present

## 2023-01-19 DIAGNOSIS — N186 End stage renal disease: Secondary | ICD-10-CM | POA: Diagnosis not present

## 2023-01-19 DIAGNOSIS — Z992 Dependence on renal dialysis: Secondary | ICD-10-CM | POA: Diagnosis not present

## 2023-01-20 DIAGNOSIS — Z992 Dependence on renal dialysis: Secondary | ICD-10-CM | POA: Diagnosis not present

## 2023-01-20 DIAGNOSIS — Z23 Encounter for immunization: Secondary | ICD-10-CM | POA: Diagnosis not present

## 2023-01-20 DIAGNOSIS — N186 End stage renal disease: Secondary | ICD-10-CM | POA: Diagnosis not present

## 2023-01-21 DIAGNOSIS — Z992 Dependence on renal dialysis: Secondary | ICD-10-CM | POA: Diagnosis not present

## 2023-01-21 DIAGNOSIS — N186 End stage renal disease: Secondary | ICD-10-CM | POA: Diagnosis not present

## 2023-01-21 DIAGNOSIS — Z23 Encounter for immunization: Secondary | ICD-10-CM | POA: Diagnosis not present

## 2023-01-22 DIAGNOSIS — Z23 Encounter for immunization: Secondary | ICD-10-CM | POA: Diagnosis not present

## 2023-01-22 DIAGNOSIS — S52502D Unspecified fracture of the lower end of left radius, subsequent encounter for closed fracture with routine healing: Secondary | ICD-10-CM | POA: Diagnosis not present

## 2023-01-22 DIAGNOSIS — Z992 Dependence on renal dialysis: Secondary | ICD-10-CM | POA: Diagnosis not present

## 2023-01-22 DIAGNOSIS — N186 End stage renal disease: Secondary | ICD-10-CM | POA: Diagnosis not present

## 2023-01-23 DIAGNOSIS — N186 End stage renal disease: Secondary | ICD-10-CM | POA: Diagnosis not present

## 2023-01-23 DIAGNOSIS — Z992 Dependence on renal dialysis: Secondary | ICD-10-CM | POA: Diagnosis not present

## 2023-01-23 DIAGNOSIS — Z23 Encounter for immunization: Secondary | ICD-10-CM | POA: Diagnosis not present

## 2023-01-24 DIAGNOSIS — N186 End stage renal disease: Secondary | ICD-10-CM | POA: Diagnosis not present

## 2023-01-24 DIAGNOSIS — Z992 Dependence on renal dialysis: Secondary | ICD-10-CM | POA: Diagnosis not present

## 2023-01-24 DIAGNOSIS — Z23 Encounter for immunization: Secondary | ICD-10-CM | POA: Diagnosis not present

## 2023-01-25 DIAGNOSIS — Z992 Dependence on renal dialysis: Secondary | ICD-10-CM | POA: Diagnosis not present

## 2023-01-25 DIAGNOSIS — Z23 Encounter for immunization: Secondary | ICD-10-CM | POA: Diagnosis not present

## 2023-01-25 DIAGNOSIS — N186 End stage renal disease: Secondary | ICD-10-CM | POA: Diagnosis not present

## 2023-01-26 DIAGNOSIS — Z992 Dependence on renal dialysis: Secondary | ICD-10-CM | POA: Diagnosis not present

## 2023-01-26 DIAGNOSIS — Z794 Long term (current) use of insulin: Secondary | ICD-10-CM | POA: Diagnosis not present

## 2023-01-26 DIAGNOSIS — N186 End stage renal disease: Secondary | ICD-10-CM | POA: Diagnosis not present

## 2023-01-26 DIAGNOSIS — Z23 Encounter for immunization: Secondary | ICD-10-CM | POA: Diagnosis not present

## 2023-01-26 DIAGNOSIS — E119 Type 2 diabetes mellitus without complications: Secondary | ICD-10-CM | POA: Diagnosis not present

## 2023-01-27 DIAGNOSIS — Z992 Dependence on renal dialysis: Secondary | ICD-10-CM | POA: Diagnosis not present

## 2023-01-27 DIAGNOSIS — N186 End stage renal disease: Secondary | ICD-10-CM | POA: Diagnosis not present

## 2023-01-27 DIAGNOSIS — Z23 Encounter for immunization: Secondary | ICD-10-CM | POA: Diagnosis not present

## 2023-01-28 DIAGNOSIS — Z23 Encounter for immunization: Secondary | ICD-10-CM | POA: Diagnosis not present

## 2023-01-28 DIAGNOSIS — Z992 Dependence on renal dialysis: Secondary | ICD-10-CM | POA: Diagnosis not present

## 2023-01-28 DIAGNOSIS — N186 End stage renal disease: Secondary | ICD-10-CM | POA: Diagnosis not present

## 2023-01-29 ENCOUNTER — Ambulatory Visit: Payer: Medicare Other | Admitting: Internal Medicine

## 2023-01-29 DIAGNOSIS — N186 End stage renal disease: Secondary | ICD-10-CM | POA: Diagnosis not present

## 2023-01-29 DIAGNOSIS — Z992 Dependence on renal dialysis: Secondary | ICD-10-CM | POA: Diagnosis not present

## 2023-01-29 DIAGNOSIS — Z23 Encounter for immunization: Secondary | ICD-10-CM | POA: Diagnosis not present

## 2023-01-30 DIAGNOSIS — N186 End stage renal disease: Secondary | ICD-10-CM | POA: Diagnosis not present

## 2023-01-30 DIAGNOSIS — Z992 Dependence on renal dialysis: Secondary | ICD-10-CM | POA: Diagnosis not present

## 2023-01-30 DIAGNOSIS — Z23 Encounter for immunization: Secondary | ICD-10-CM | POA: Diagnosis not present

## 2023-01-31 DIAGNOSIS — Z992 Dependence on renal dialysis: Secondary | ICD-10-CM | POA: Diagnosis not present

## 2023-01-31 DIAGNOSIS — Z23 Encounter for immunization: Secondary | ICD-10-CM | POA: Diagnosis not present

## 2023-01-31 DIAGNOSIS — N186 End stage renal disease: Secondary | ICD-10-CM | POA: Diagnosis not present

## 2023-02-01 DIAGNOSIS — Z23 Encounter for immunization: Secondary | ICD-10-CM | POA: Diagnosis not present

## 2023-02-01 DIAGNOSIS — Z992 Dependence on renal dialysis: Secondary | ICD-10-CM | POA: Diagnosis not present

## 2023-02-01 DIAGNOSIS — N186 End stage renal disease: Secondary | ICD-10-CM | POA: Diagnosis not present

## 2023-02-02 DIAGNOSIS — Z23 Encounter for immunization: Secondary | ICD-10-CM | POA: Diagnosis not present

## 2023-02-02 DIAGNOSIS — N186 End stage renal disease: Secondary | ICD-10-CM | POA: Diagnosis not present

## 2023-02-02 DIAGNOSIS — Z992 Dependence on renal dialysis: Secondary | ICD-10-CM | POA: Diagnosis not present

## 2023-02-03 DIAGNOSIS — Z23 Encounter for immunization: Secondary | ICD-10-CM | POA: Diagnosis not present

## 2023-02-03 DIAGNOSIS — Z992 Dependence on renal dialysis: Secondary | ICD-10-CM | POA: Diagnosis not present

## 2023-02-03 DIAGNOSIS — N186 End stage renal disease: Secondary | ICD-10-CM | POA: Diagnosis not present

## 2023-02-04 DIAGNOSIS — N186 End stage renal disease: Secondary | ICD-10-CM | POA: Diagnosis not present

## 2023-02-04 DIAGNOSIS — Z992 Dependence on renal dialysis: Secondary | ICD-10-CM | POA: Diagnosis not present

## 2023-02-04 DIAGNOSIS — Z23 Encounter for immunization: Secondary | ICD-10-CM | POA: Diagnosis not present

## 2023-02-05 DIAGNOSIS — Z992 Dependence on renal dialysis: Secondary | ICD-10-CM | POA: Diagnosis not present

## 2023-02-05 DIAGNOSIS — Z23 Encounter for immunization: Secondary | ICD-10-CM | POA: Diagnosis not present

## 2023-02-05 DIAGNOSIS — N186 End stage renal disease: Secondary | ICD-10-CM | POA: Diagnosis not present

## 2023-02-06 ENCOUNTER — Telehealth: Payer: Medicare Other | Admitting: Gastroenterology

## 2023-02-06 ENCOUNTER — Encounter: Payer: Self-pay | Admitting: Gastroenterology

## 2023-02-06 DIAGNOSIS — K219 Gastro-esophageal reflux disease without esophagitis: Secondary | ICD-10-CM | POA: Diagnosis not present

## 2023-02-06 DIAGNOSIS — Z23 Encounter for immunization: Secondary | ICD-10-CM | POA: Diagnosis not present

## 2023-02-06 DIAGNOSIS — K2270 Barrett's esophagus without dysplasia: Secondary | ICD-10-CM

## 2023-02-06 DIAGNOSIS — Z992 Dependence on renal dialysis: Secondary | ICD-10-CM | POA: Diagnosis not present

## 2023-02-06 DIAGNOSIS — N186 End stage renal disease: Secondary | ICD-10-CM | POA: Diagnosis not present

## 2023-02-06 DIAGNOSIS — K3184 Gastroparesis: Secondary | ICD-10-CM

## 2023-02-06 DIAGNOSIS — Z860100 Personal history of colon polyps, unspecified: Secondary | ICD-10-CM

## 2023-02-06 MED ORDER — METOCLOPRAMIDE HCL 10 MG PO TABS: 10.0000 mg | ORAL_TABLET | Freq: Every day | ORAL | 11 refills | Status: DC

## 2023-02-06 NOTE — Patient Instructions (Signed)
We have sent the following medications to your pharmacy for you to pick up at your convenience: Reglan   _______________________________________________________  If your blood pressure at your visit was 140/90 or greater, please contact your primary care physician to follow up on this.  _______________________________________________________  If you are age 59 or older, your body mass index should be between 23-30. Your There is no height or weight on file to calculate BMI. If this is out of the aforementioned range listed, please consider follow up with your Primary Care Provider.  If you are age 61 or younger, your body mass index should be between 19-25. Your There is no height or weight on file to calculate BMI. If this is out of the aformentioned range listed, please consider follow up with your Primary Care Provider.   ________________________________________________________  The [facility] providers would like to encourage you to use [facility] to communicate with providers for non-urgent requests or questions.  Due to long hold times on the telephone, sending your provider a message by [facility] may be a faster and more efficient way to get a response.  Please allow 48 business hours for a response.  Please remember that this is for non-urgent requests.  _______________________________________________________  Due to recent changes in healthcare laws, you may see the results of your imaging and laboratory studies on MyChart before your provider has had a chance to review them.  We understand that in some cases there may be results that are confusing or concerning to you. Not all laboratory results come back in the same time frame and the provider may be waiting for multiple results in order to interpret others.  Please give [place] 48 hours in order for your provider to thoroughly review all the results before contacting the office for clarification of your results.   Thank you for choosing me and  [facility].  Bayley McMichael PA-C

## 2023-02-06 NOTE — Progress Notes (Signed)
Chief Complaint: Med Refill Primary GI MD: Dr. Rhea Belton  HPI: 57 year old male history of multiple edematous colon polyps, GERD, gastroparesis, family history of Elita Boone cirrhosis, ESRD on peritoneal dialysis, CAD s/p CABG 01/2019, diabetes with neuropathy and retinopathy, Barrett's esophagus, presents today as a virtual visit for medication refill  Last seen in the office November 2022 by Dr. Rhea Belton and was doing well at that time.  States today he is doing well.  Denies abdominal pain, nausea, vomiting, GERD, weight loss, melena, hematochezia.  States he takes his Reglan for his history of gastroparesis and does well on it.  Also doing well on Nexium 20 Mg daily.  Denies any adverse side effects from his Reglan.  Patient has a history of Barrett's esophagus with last EGD February 2022 (see below).  Biopsies at that time did not show intestinal metaplasia.  He is recommended to follow-up with repeat EGD February 2024.  The reason for his virtual visit today is because he had a wrist fracture and has been struggling with that so it has been difficult for him to get transportation to places.   PREVIOUS GI WORKUP   Colonoscopy 05/30/2021 - Post- polypectomy scar in the cecum. No evidence of residual polyp.  - Two 2 to 3 mm polyps (tubular adenoma) in the ascending colon, removed with a cold biopsy forceps. Resected and retrieved.  - One 9 mm polyp in the distal ascending colon, removed with a cold snare. Resected and retrieved.  - The distal rectum and anal verge are normal on retroflexion view. - recall 05/2024  EGD 05/21/2020 for follow-up of Barrett's esophagus - Normal esophagus.  - A single gastric polyp. Biopsied.  - Normal examined duodenum.  - Biopsies were taken with a cold forceps for histology and Helicobacter pylori testing. - due for recall 05/2022  Colonoscopy 05/21/2020 - One 20 mm polyp in the cecum, removed with mucosal resection. Resected and retrieved. EMR.  - Three 2 to 9  mm polyps in the cecum, removed with a cold snare. Resected and retrieved.  - One 7 mm polyp at the ileocecal valve, removed with a cold snare. Resected and retrieved.  - Two 7 to 8 mm polyps in the ascending colon, removed with a cold snare. Resected and retrieved.  - Eight 3 to 10 mm polyps in the transverse colon, removed with a cold snare. Resected and retrieved.  - Four 5 to 8 mm polyps in the descending colon, removed with a cold snare. Resected and retrieved.  - Three 3 to 6 mm polyps in the sigmoid colon, removed with a cold snare. Resected and retrieved.  - Internal hemorrhoids.  - Mucosal resection was performed. Resection and retrieval were complete.  Diagnosis 1. Surgical [P], gastric antrum polyp biopsies (multiple pieces) - HYPERPLASTIC GASTRIC POLYP(S) - NO H. PYLORI, INTESTINAL METAPLASIA OR MALIGNANCY IDENTIFIED 2. Surgical [P], gastric antrum and gastric body - REACTIVE GASTROPATHY - NO H. PYLORI OR INTESTINAL METAPLASIA IDENTIFIED - SEE COMMENT 3. Surgical [P], colon, cecum x 4 - MULTIPLE FRAGMENTS OF TUBULAR ADENOMA(S) - NO HIGH GRADE DYSPLASIA OR MALIGNANCY IDENTIFIED 4. Surgical [P], ileocecal valve x 1, ascending x 2, polyp (3) - MULTIPLE FRAGMENTS OF TUBULAR ADENOMA(S) - NO HIGH GRADE DYSPLASIA OR MALIGNANCY IDENTIFIED 5. Surgical [P], colon, transverse x 8 - TUBULAR ADENOMA (6 FRAGMENTS) - MULTIPLE FRAGMENTS OF BENIGN COLONIC MUCOSA - NO HIGH GRADE DYSPLASIA OR MALIGNANCY IDENTIFIED 6. Surgical [P], colon, descending x 4, sigmoid x 3, polyp (7) - MULTIPLE FRAGMENTS OF  TUBULAR ADENOMA(S) - NO HIGH GRADE DYSPLASIA OR MALIGNANCY IDENTIFIED  Past Medical History:  Diagnosis Date   Anemia    Arthritis    Asthma    as child   Atypical mole 09/22/2022   R spinal mid back, needs excision   Barrett's esophagus 02/16/2014   short segment   Cataract    Chronic kidney disease (CKD), stage IV (severe) (HCC)    Coronary artery disease    DDD (degenerative disc  disease), cervical    Diabetes mellitus without complication (HCC)    Diabetic osteomyelitis (HCC)    Diabetic peripheral neuropathy (HCC)    Diabetic ulcer of foot with muscle involvement without evidence of necrosis (HCC)    DIABETIC ULCERATIONS ASSOCIATED WITH IRRITATION LATERAL ANKLE LEFT GREATER THAN RIGHT WITH MILD CELLULITIS    Diverticulosis    DKA (diabetic ketoacidoses) 08/23/2017   Edema, lower extremity    Fatty liver    Gastroparesis 2015   GERD (gastroesophageal reflux disease)    Gilbert's disease    Glaucoma    Hx of BKA, left (HCC)    Hx of CABG 01/27/2019   2 vessels; LIMA-LAD, SVG-D1   Hx of heart artery stent 11/20/2009   80% D1 stenosis - 2.5 x 18 mm Xience V Everolimus (DES) stent x 1 placed   Hyperlipidemia    Hypertension    Left below-knee amputee (HCC)    Legally blind    Myocardial infarction (HCC) 11/2009   Neuropathy    Osteoarthritis of spine    Osteomyelitis of left foot (HCC)    Peritoneal dialysis catheter in place [facility])    Does dialysis over night.   PONV (postoperative nausea and vomiting)    PVD (peripheral vascular disease) (HCC)    Seasonal allergies    Shoulder pain    Tubular adenoma of colon    Ulcer of foot (HCC) 11/17/2012   DIABETIC ULCERATIONS ASSOCIATED WITH IRRITATION LATERAL ANKLE LEFT GREATER THAN RIGHT WITH MILD CELLULITIS    Past Surgical History:  Procedure Laterality Date   ABDOMINAL AORTOGRAM N/A 05/20/2016   Procedure: Abdominal Aortogram possible intervention;  Surgeon: Renford Dills, MD;  Location: [facility] INVASIVE CV LAB;  Service: Cardiovascular;  Laterality: N/A;   AMPUTATION Left 09/05/2017   Procedure: AMPUTATION BELOW KNEE;  Surgeon: Jones Broom, MD;  Location: [facility] OR;  Service: Orthopedics;  Laterality: Left;   ANTERIOR CERVICAL DECOMP/DISCECTOMY FUSION N/A 05/07/2017   Procedure: ANTERIOR CERVICAL DECOMPRESSION/DISCECTOMY Patrina Levering PROSTHESIS,PLATE/SCREWS CERVICAL FIVE - CERVICAL SIX;  Surgeon:  Tressie Stalker, MD;  Location: [facility] OR;  Service: Neurosurgery;  Laterality: N/A;   CAPD INSERTION N/A 08/30/2020   Procedure: LAPAROSCOPIC INSERTION CONTINUOUS AMBULATORY PERITONEAL DIALYSIS  (CAPD) CATHETER;  Surgeon: Henrene Dodge, MD;  Location: [facility] ORS;  Service: General;  Laterality: N/A;   CATARACT EXTRACTION W/ INTRAOCULAR LENS IMPLANT     CATARACT EXTRACTION W/PHACO Left 02/26/2017   Procedure: CATARACT EXTRACTION PHACO AND INTRAOCULAR LENS PLACEMENT (IOC);  Surgeon: Nevada Crane, MD;  Location: [facility] ORS;  Service: Ophthalmology;  Laterality: Left;  Lot # [id number] H [place]: 00:25.3 AP%: 6.2 CDE: 1.59    CHOLECYSTECTOMY N/A    COLONOSCOPY WITH PROPOFOL N/A 05/30/2021   Procedure: COLONOSCOPY WITH PROPOFOL;  Surgeon: Beverley Fiedler, MD;  Location: [facility];  Service: Gastroenterology;  Laterality: N/A;   CORONARY ANGIOPLASTY WITH STENT PLACEMENT  11/20/2009   80% D1 stenosis - 2.5 x 18 mm Xience V Everolimus (DES) stent x 1 placed; LOCATION: [facility]; SURGEON: Harold Hedge, MD  CORONARY ARTERY BYPASS GRAFT N/A 01/27/2019   Procedure: OFF PUMP CORONARY ARTERY BYPASS GRAFTING (CABG) X 2 WITH ENDOSCOPIC HARVESTING OF RIGHT GREATER SAPHENOUS VEIN. LIMA TO LAD;  Surgeon: Corliss Skains, MD;  Location: [facility] OR;  Service: Open Heart Surgery;  Laterality: N/A;   CORONARY STENT INTERVENTION N/A 01/21/2019   Procedure: CORONARY STENT INTERVENTION;  Surgeon: Lyn Records, MD;  Location: [facility] INVASIVE CV LAB;  Service: Cardiovascular;  Laterality: N/A;   EPIBLEPHERON REPAIR WITH TEAR DUCT PROBING     EYE SURGERY Right 02/09/2014   cataract extraction   INSERTION EXPRESS TUBE SHUNT Right 09/06/2015   Procedure: INSERTION AHMED TUBE SHUNT with tutoplast allograft;  Surgeon: Nevada Crane, MD;  Location: [facility] ORS;  Service: Ophthalmology;  Laterality: Right;   laparoscopic insertion continuous peritoneal dialysis  2022   LEFT HEART CATH AND CORONARY ANGIOGRAPHY N/A 01/20/2019   Procedure:  LEFT HEART CATH AND CORONARY ANGIOGRAPHY;  Surgeon: Runell Gess, MD;  Location: [facility];  Service: Cardiovascular;  Laterality: N/A;   POLYPECTOMY  05/30/2021   Procedure: POLYPECTOMY;  Surgeon: Beverley Fiedler, MD;  Location: [facility];  Service: Gastroenterology;;   TEE WITHOUT CARDIOVERSION N/A 01/27/2019   Procedure: TRANSESOPHAGEAL ECHOCARDIOGRAM (TEE);  Surgeon: Corliss Skains, MD;  Location: [facility] OR;  Service: Open Heart Surgery;  Laterality: N/A;   VASECTOMY      Current Outpatient Medications  Medication Sig Dispense Refill   acetaminophen (TYLENOL) 325 MG tablet Take 1 tablet (325 mg total) by mouth every 6 (six) hours as needed for mild pain (pain score 1-3 or temp > 100.5). 30 tablet 0   aspirin (ASPIRIN CHILDRENS) 81 MG chewable tablet Chew 1 tablet (81 mg total) by mouth daily. 30 tablet 1   atorvastatin (LIPITOR) 80 MG tablet TAKE 1 TABLET BY MOUTH EVERY DAY (Patient taking differently: Take 80 mg by mouth at bedtime.) 90 tablet 1   azelastine (ASTELIN) 0.1 % nasal spray Place 2 sprays into both nostrils 2 (two) times daily. Use in each nostril as directed (Patient not taking: Reported on 10/06/2022) 30 mL 12   brimonidine-timolol (COMBIGAN) 0.2-0.5 % ophthalmic solution Place 1 drop into both eyes every 12 (twelve) hours.     Cholecalciferol (VITAMIN D) 50 MCG (2000 UT) CAPS Take 2,000 Units by mouth daily.     Continuous Glucose Sensor (DEXCOM G7 SENSOR) MISC USE 1 DEVICE AS DIRECTED 9 each 3   dorzolamide (TRUSOPT) 2 % ophthalmic solution Place 1 drop into both eyes 2 (two) times daily.     esomeprazole (NEXIUM) 20 MG capsule Take 20 mg by mouth daily.     ezetimibe (ZETIA) 10 MG tablet TAKE 1 TABLET BY MOUTH EVERY DAY 90 tablet 1   FLOMAX 0.4 MG CAPS capsule 0.4 mg at bedtime.     fluticasone (FLONASE) 50 MCG/ACT nasal spray SPRAY 2 SPRAYS INTO EACH NOSTRIL EVERY DAY (Patient not taking: Reported on 10/06/2022) 48 mL 2   gabapentin (NEURONTIN) 300 MG  capsule TAKE 1 CAPSULE BY MOUTH TWICE A DAY (Patient taking differently: Take 600 mg by mouth at bedtime.) 60 capsule 6   gentamicin cream (GARAMYCIN) 0.1 % Apply 1 application topically every other day. Around port     insulin degludec (TRESIBA FLEXTOUCH) 200 UNIT/ML FlexTouch Pen Inject 42 Units into the skin in the morning and at bedtime. 30 mL 3   insulin lispro (HUMALOG KWIKPEN) 100 UNIT/ML KwikPen Max daily 45 units (Patient taking differently: Max daily 45 units(Sliding  scale)) 45 mL 3   Insulin NPH, Human,, Isophane, (HUMULIN N KWIKPEN) 100 UNIT/ML Kiwkpen Inject 70 Units into the skin daily in the afternoon. To be taken at the start of dialysis 70 mL 4   Insulin Pen Needle 32G X 4 MM MISC Inject 1 Device into the skin 5 (five) times daily. Use to inject Lantus and Novolog up to 5 times daily 500 each 3   Insulin Syringe-Needle U-100 28G X 1/2" 1 ML MISC 1 Device by Does not apply route daily in the afternoon. (Patient not taking: Reported on 10/06/2022) 100 each 3   irbesartan (AVAPRO) 75 MG tablet Take 75 mg by mouth at bedtime. (Patient not taking: Reported on 10/06/2022)     lactulose (CHRONULAC) 10 GM/15ML solution TAKE 30 MLS (20 G TOTAL) BY MOUTH AS NEEDED. 946 mL 0   latanoprost (XALATAN) 0.005 % ophthalmic solution Place 1 drop into both eyes 2 (two) times daily.     liraglutide (VICTOZA) 18 MG/3ML SOPN start at 0.6 mg daily for 4 weeks, then increase to 1.2 mg daily (Patient not taking: Reported on 10/06/2022) 9 mL 0   metoCLOPramide (REGLAN) 10 MG tablet Take 1 tablet (10 mg total) by mouth at bedtime. NEEDS OFFICE VISIT FOR FURTHER REFILLS 30 tablet 0   midodrine (PROAMATINE) 5 MG tablet Take 5 mg by mouth 2 (two) times daily as needed (Below 100).     mometasone (ELOCON) 0.1 % cream Apply 1 Application topically as directed. Qd to bid to aa right ankle, right lower leg until improved, then prn flares 45 g 1   Multiple Vitamin (MULTIVITAMIN ADULT PO) Take 1 tablet by mouth daily.      mupirocin ointment (BACTROBAN) 2 % Apply 1 Application topically daily. Every day to excision site on back 22 g 0   ondansetron (ZOFRAN-ODT) 4 MG disintegrating tablet Take 4 mg by mouth every 8 (eight) hours as needed for nausea or vomiting.     PARoxetine (PAXIL) 10 MG tablet Take 10 mg by mouth daily.     senna (SENOKOT) 8.6 MG tablet Take 2 tablets by mouth daily.     sertraline (ZOLOFT) 25 MG tablet Take 25 mg by mouth daily.     Testosterone 20.25 MG/ACT (1.62%) GEL Place 2 Pump onto the skin daily in the afternoon. 75 g 5   torsemide (DEMADEX) 20 MG tablet Take 20-40 mg by mouth See admin instructions. Take 40 mg in the morning and 40 mg in the afternoon     No current facility-administered medications for this visit.    Allergies as of 02/06/2023   (No Known Allergies)    Family History  Problem Relation Age of Onset   Hyperlipidemia Mother    Diabetes Mother    Liver disease Mother        NASH   Other Mother        immune thrombocytopenic pupura (ITP)   Heart attack Father    Hyperlipidemia Father    Diabetes Father    Hypertension Father    Lymphoma Father    Hemochromatosis Other        Grandmother (? which side)   Polycythemia Other    Colon cancer Neg Hx    Esophageal cancer Neg Hx    Rectal cancer Neg Hx    Stomach cancer Neg Hx     Social History   Socioeconomic History   Marital status: Married    Spouse name: Not on file   Number of  children: Not on file   Years of education: Not on file   Highest education level: Not on file  Occupational History   Not on file  Tobacco Use   Smoking status: Former    Current packs/day: 0.00    Average packs/day: 0.5 packs/day for 20.0 years (10.0 ttl pk-yrs)    Types: Cigarettes    Start date: 03/14/1988    Quit date: 03/14/2008    Years since quitting: 14.9   Smokeless tobacco: Never  Vaping Use   Vaping status: Never Used  Substance and Sexual Activity   Alcohol use: Not Currently    Comment: RARELY    Drug use: No   Sexual activity: Not on file  Other Topics Concern   Not on file  Social History Narrative   Used to work as Radiation protection practitioner    Married    Social Determinants of Health   Financial Resource Strain: Low Risk  (12/26/2020)   Overall Financial Resource Strain (CARDIA)    Difficulty of Paying Living Expenses: Not hard at all  Food Insecurity: Not on file  Transportation Needs: Not on file  Physical Activity: Not on file  Stress: No Stress Concern Present (12/21/2019)   [employer] of Occupational Health - Occupational Stress Questionnaire    Feeling of Stress : Not at all  Social Connections: Unknown (02/26/2022)   Received from [facility], [facility]   Social Network    Social Network: Not on file  Intimate Partner Violence: Unknown (02/26/2022)   Received from [employer], [facility]   HITS    Physically Hurt: Not on file    Insult or Talk Down To: Not on file    Threaten Physical Harm: Not on file    Scream or Curse: Not on file    Review of Systems:    Constitutional: No weight loss, fever, chills, weakness or fatigue HEENT: Eyes: No change in vision               Ears, Nose, Throat:  No change in hearing or congestion Skin: No rash or itching Cardiovascular: No chest pain, chest pressure or palpitations   Respiratory: No SOB or cough Gastrointestinal: See HPI and otherwise negative Genitourinary: No dysuria or change in urinary frequency Neurological: No headache, dizziness or syncope Musculoskeletal: No new muscle or joint pain Hematologic: No bleeding or bruising Psychiatric: No history of depression or anxiety    Physical Exam:  Vital signs: There were no vitals taken for this visit.  Constitutional: NAD, Well nourished, alert and cooperative   RELEVANT LABS AND IMAGING: CBC    Component Value Date/Time   WBC 8.6 12/06/2021 0837   RBC 3.50 (L) 12/06/2021 0837   HGB 11.9 (L) 12/06/2021 0837   HGB 9.8 (L) 02/14/2019 1038   HCT  34.2 (L) 12/06/2021 0837   HCT 29.8 (L) 02/14/2019 1038   PLT 150.0 12/06/2021 0837   PLT 272 02/14/2019 1038   MCV 97.6 12/06/2021 0837   MCV 86 02/14/2019 1038   MCH 30.9 08/11/2020 0334   MCHC 34.9 12/06/2021 0837   RDW 13.6 12/06/2021 0837   RDW 13.5 02/14/2019 1038   LYMPHSABS 0.9 08/10/2020 1440   MONOABS 1.3 (H) 08/10/2020 1440   EOSABS 0.3 08/10/2020 1440   BASOSABS 0.0 08/10/2020 1440    CMP     Component Value Date/Time   NA 136 12/06/2021 0837   NA 140 02/14/2019 1038   K 4.4 12/06/2021 0837   K 4.5 12/29/2013 1521  CL 101 12/06/2021 0837   CO2 29 12/06/2021 0837   GLUCOSE 163 (H) 12/06/2021 0837   BUN 33 (H) 12/06/2021 0837   BUN 26 (H) 02/14/2019 1038   CREATININE 3.66 (H) 12/06/2021 0837   CREATININE 2.42 (H) 12/31/2018 1454   CALCIUM 8.8 12/06/2021 0837   PROT 6.1 09/16/2022 0935   ALBUMIN 3.2 (L) 09/16/2022 0935   AST 34 09/16/2022 0935   ALT 27 09/16/2022 0935   ALKPHOS 98 09/16/2022 0935   BILITOT 0.6 09/16/2022 0935   GFRNONAA 11 (L) 08/11/2020 0334   GFRAA 28 (L) 03/09/2019 1551     Assessment/Plan:   57 year old male history of adenomatous colon polyps, gastroparesis (on Reglan), a ESRD on peritoneal dialysis, CAD with prior CABG 01/2019, diabetes with neuropathy and retinopathy presenting for medication refill of Reglan.  Gastroparesis/GERD Symptoms currently well-controlled on Nexium 20 Mg daily and Reglan - Refill Reglan 1 year - Refill Nexium 1 year - Advised patient of side effects of Reglan including tardive dyskinesia.  Declines side effects at this time  Barrett's esophagus without dysplasia History of hyperplastic gastric polyp Small subcentimeter and not associated with H. pylori.  Most recent EGD in 2022 without evidence of Barrett's esophagus. Due for repeat EGD February 2024.  Has had difficulty with transportation due to breast fracture. - Needs to schedule in person appointment to get EGD scheduled at hospital for  surveillance. Can wait until he recovers  History of colon polyps Last colonoscopy 2023 as above.  Due for repeat 05/2024  ESRD (end stage renal disease) (HCC) History of ESRD on HD.  An elective endoscopic procedure will need to be scheduled at the hospital in the outpatient setting.   Virtual Visit via Telephone Note  I connected with Randolm Idol on 02/06/23 at 11:00 AM EDT by telephone and verified that I am speaking with the correct person using two identifiers.  Location: Patient: [facility] Provider: Boone Master PA-C   I discussed the limitations, risks, security and privacy concerns of performing an evaluation and management service by telephone and the availability of in person appointments. I also discussed with the patient that there may be a patient responsible charge related to this service. The patient expressed understanding and agreed to proceed.  I discussed the assessment and treatment plan with the patient. The patient was provided an opportunity to ask questions and all were answered. The patient agreed with the plan and demonstrated an understanding of the instructions.   The patient was advised to call back or seek an in-person evaluation if the symptoms worsen or if the condition fails to improve as anticipated.  I provided 32 minutes of time during this encounter including chart review, documentation, and video conference.   Timberly Yott Leanna Sato, PA-C   [place] [place] Jolee Ewing [facility] 02/06/2023, 11:20 AM  Cc: Glori Luis, MD

## 2023-02-07 DIAGNOSIS — N186 End stage renal disease: Secondary | ICD-10-CM | POA: Diagnosis not present

## 2023-02-07 DIAGNOSIS — Z992 Dependence on renal dialysis: Secondary | ICD-10-CM | POA: Diagnosis not present

## 2023-02-07 DIAGNOSIS — Z23 Encounter for immunization: Secondary | ICD-10-CM | POA: Diagnosis not present

## 2023-02-08 ENCOUNTER — Other Ambulatory Visit: Payer: Self-pay | Admitting: Family Medicine

## 2023-02-08 DIAGNOSIS — Z23 Encounter for immunization: Secondary | ICD-10-CM | POA: Diagnosis not present

## 2023-02-08 DIAGNOSIS — Z992 Dependence on renal dialysis: Secondary | ICD-10-CM | POA: Diagnosis not present

## 2023-02-08 DIAGNOSIS — N186 End stage renal disease: Secondary | ICD-10-CM | POA: Diagnosis not present

## 2023-02-08 IMAGING — DX DG TOE GREAT 2+V*R*
4 series · 4 of 4 positions shown · non-contrast
Comparison: Right foot series 05/04/2013.

CLINICAL DATA: 54-year-old male with end-stage renal disease. Right
1st toe gangrene.

EXAM:
RIGHT GREAT TOE

[toe ap]
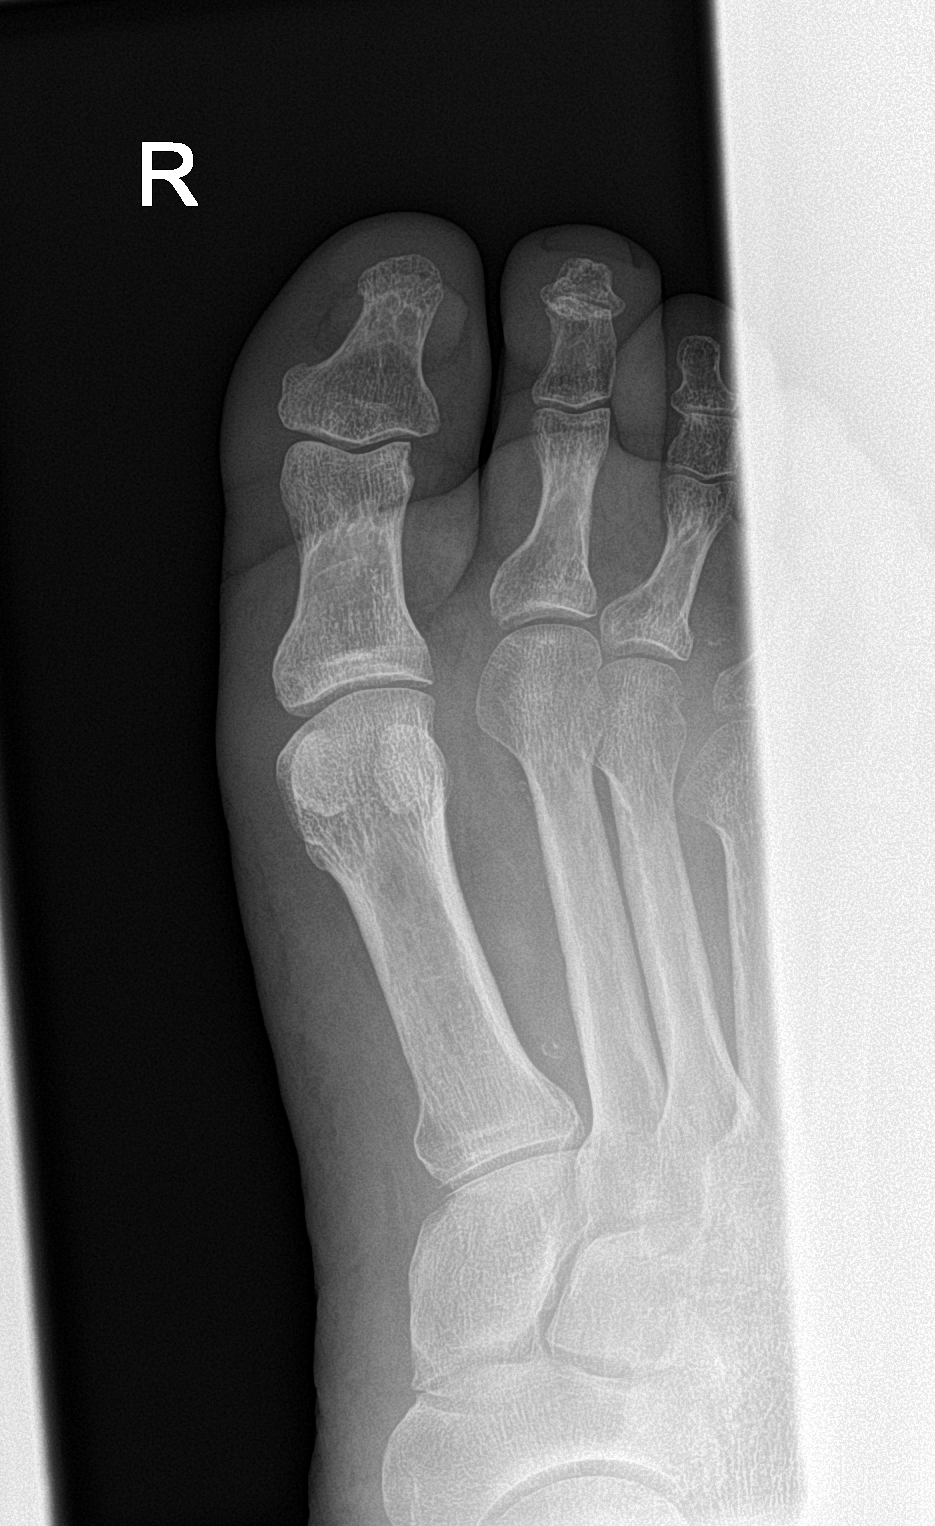

[toe obl]
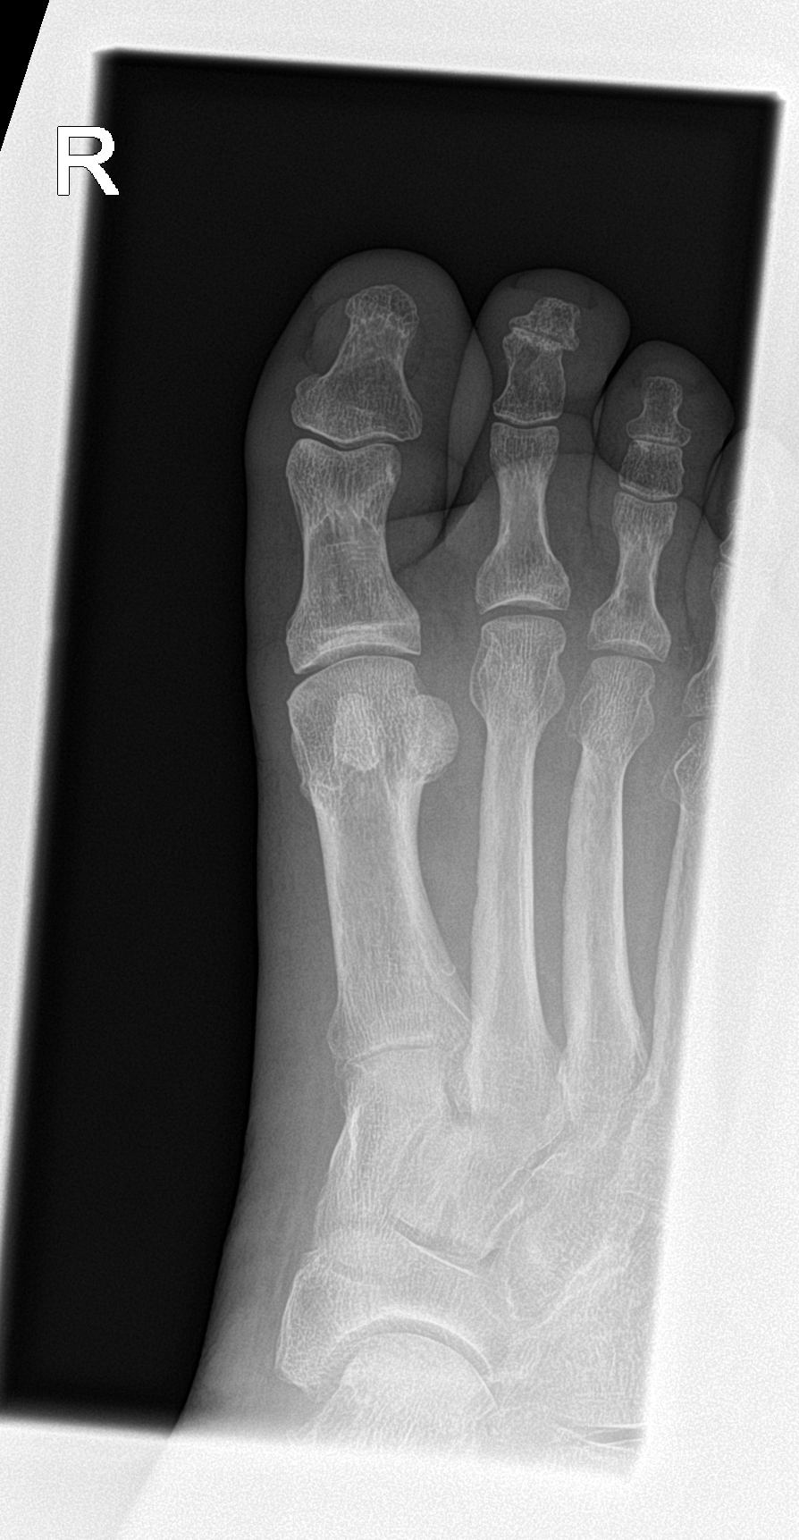

[toe lat (1 of 2)]
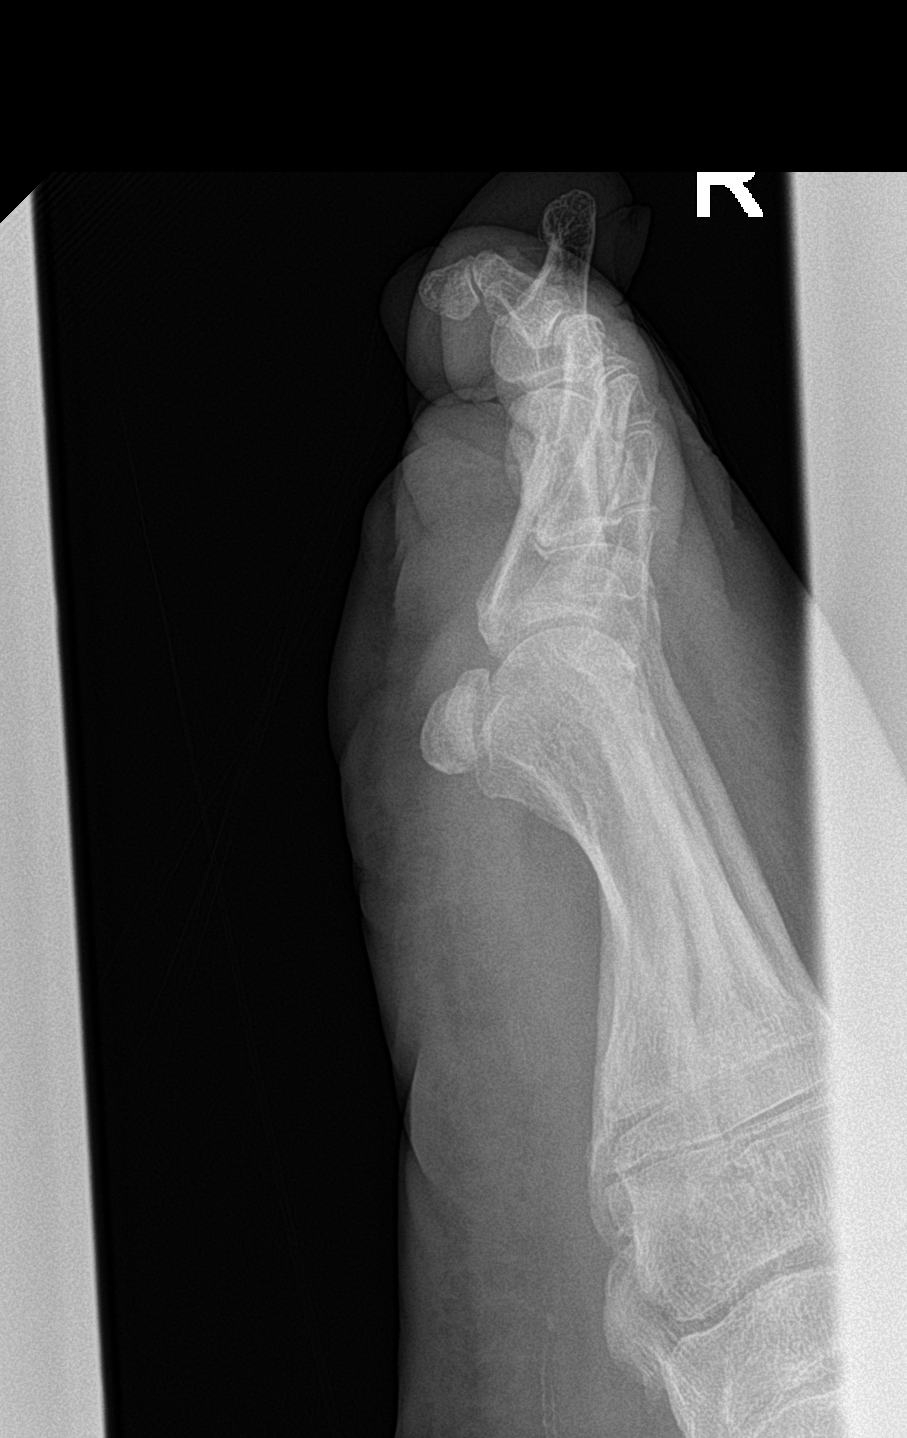

[toe lat (2 of 2)]
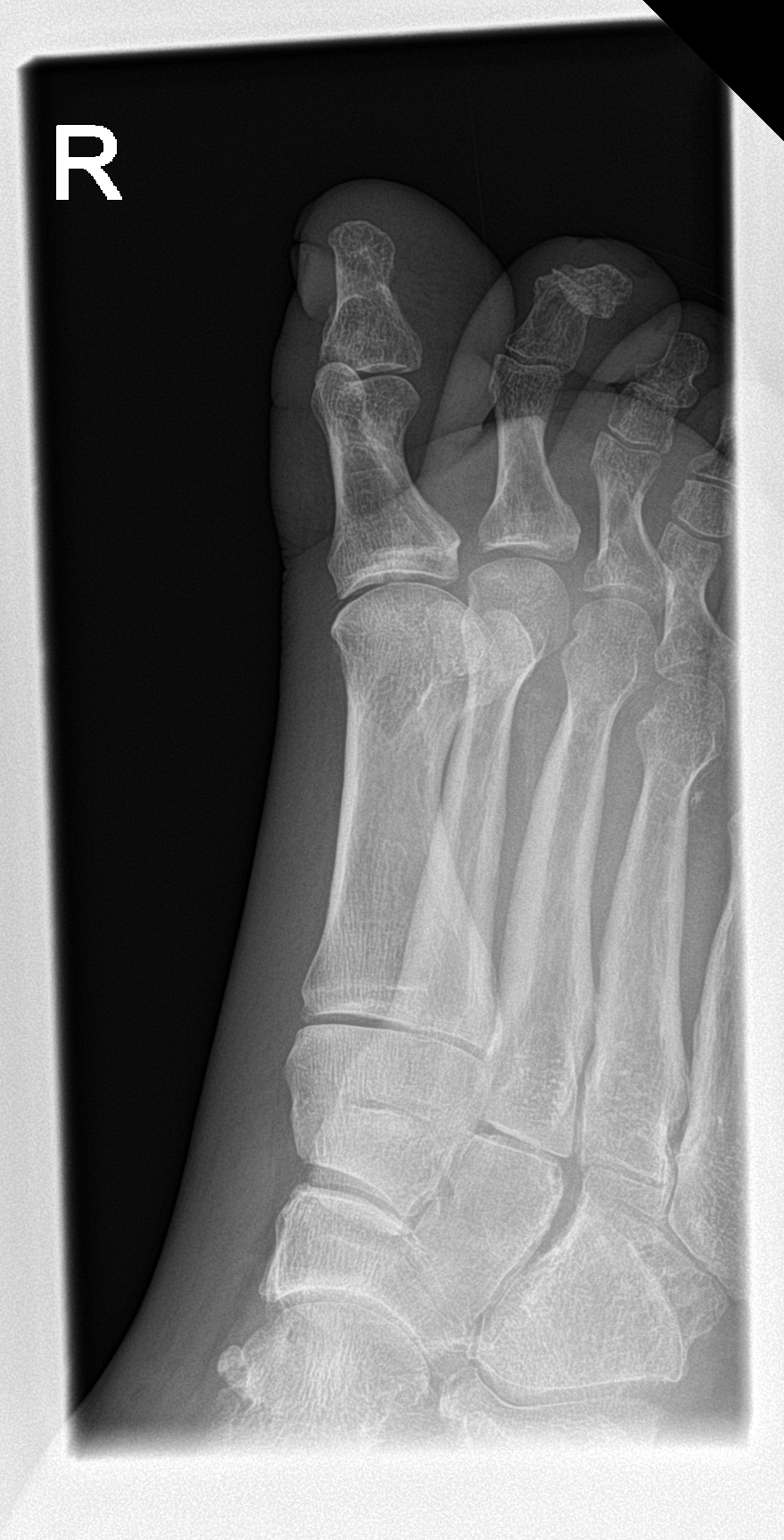

[4 of 4 positions shown; findings below may reference images not displayed]

FINDINGS: Preserved normal bone mineralization in the right 1st ray and
visible right foot. No cortical osteolysis. No acute osseous
abnormality identified.

Generalized soft tissue swelling. No soft tissue gas. There is some
calcified peripheral vascular disease evident between the 1st and
2nd metatarsals.
IMPRESSION: Soft tissue swelling with no acute osseous abnormality identified.
No radiographic evidence of osteomyelitis.

## 2023-02-08 IMAGING — DX DG CHEST 1V PORT
1 series · 1 of 1 positions shown · non-contrast
Comparison: Chest radiographs 03/04/2019 and earlier.

CLINICAL DATA: 54-year-old male with end-stage renal disease. Right
1st toe gangrene.

EXAM:
PORTABLE CHEST 1 VIEW

[chest ap]
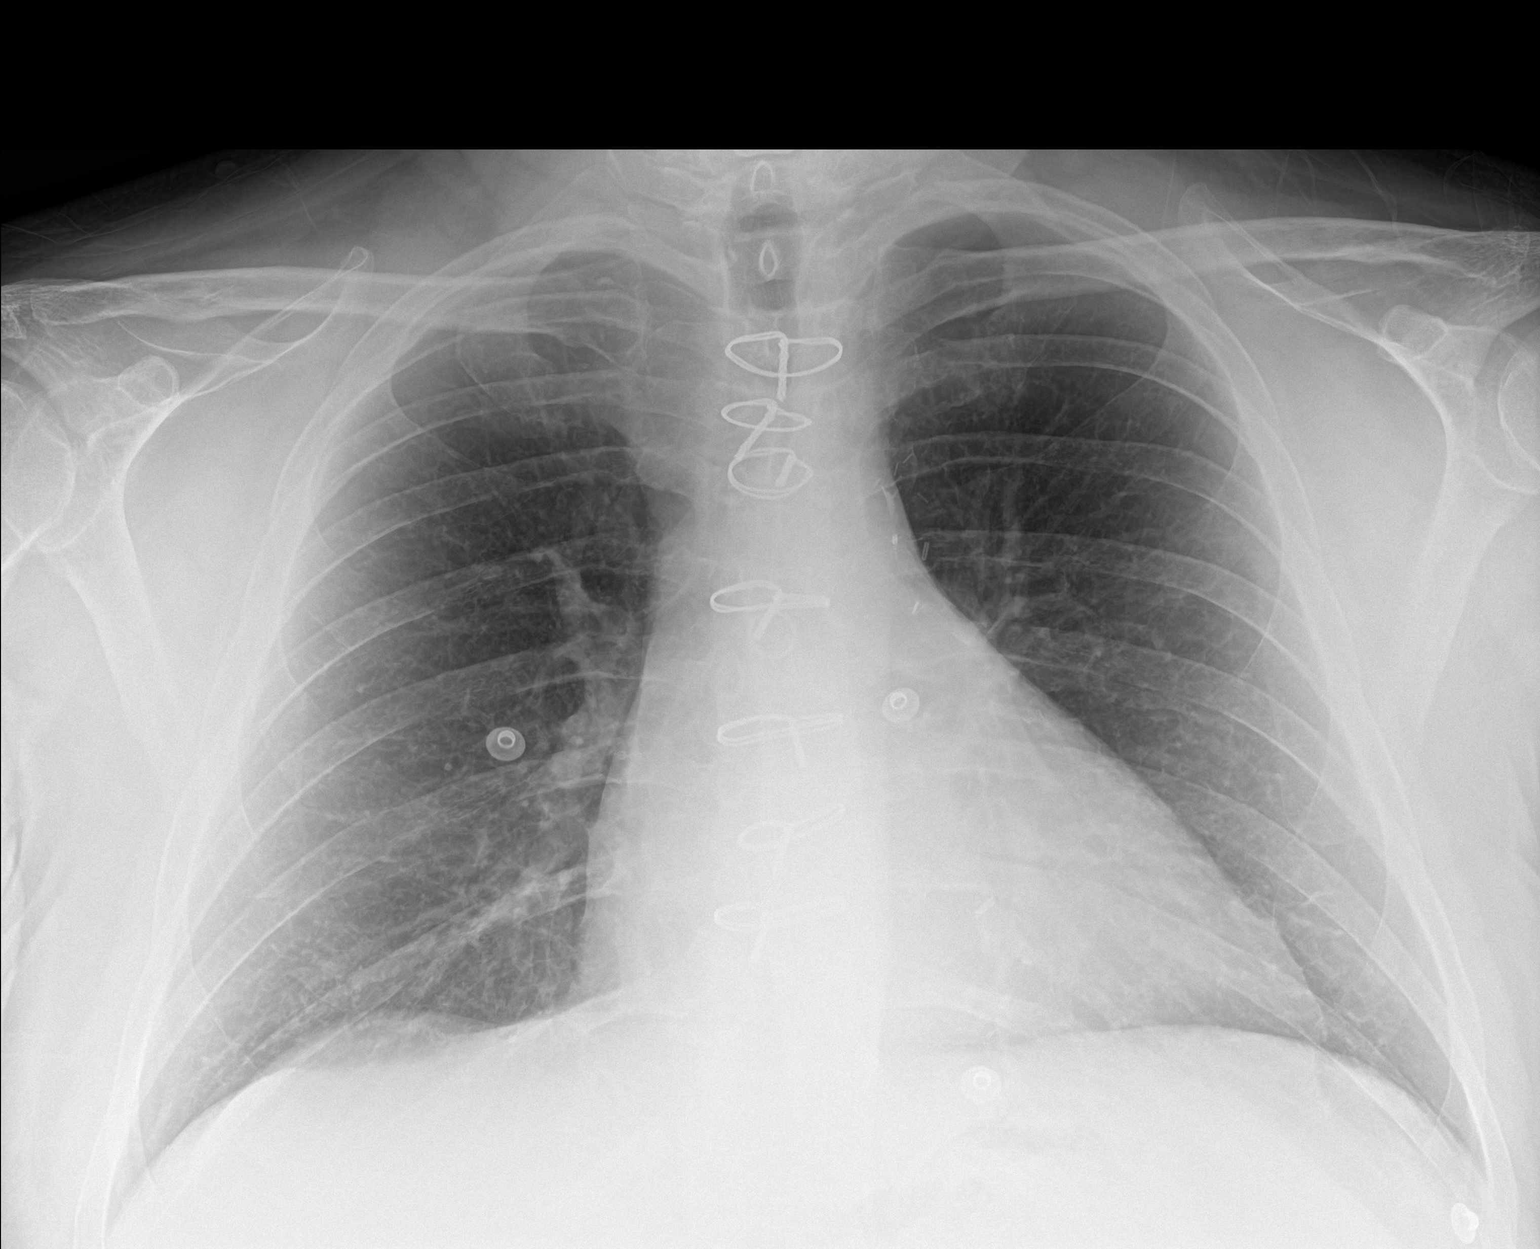

[1 of 1 positions shown; findings below may reference images not displayed]

FINDINGS: Portable AP semi upright view at 1121 hours. Prior CABG. Lung
volumes and mediastinal contours are stable and within normal
limits. Visualized tracheal air column is within normal limits. No
pneumothorax. Allowing for portable technique the lungs are clear.
Chronic lower cervical ACDF. No acute osseous abnormality
identified.
IMPRESSION: No acute cardiopulmonary abnormality.

## 2023-02-09 DIAGNOSIS — Z23 Encounter for immunization: Secondary | ICD-10-CM | POA: Diagnosis not present

## 2023-02-09 DIAGNOSIS — Z992 Dependence on renal dialysis: Secondary | ICD-10-CM | POA: Diagnosis not present

## 2023-02-09 DIAGNOSIS — N186 End stage renal disease: Secondary | ICD-10-CM | POA: Diagnosis not present

## 2023-02-09 NOTE — Progress Notes (Signed)
Addendum: Reviewed and agree with assessment and management plan. Kadijah Shamoon M, MD  

## 2023-02-10 DIAGNOSIS — N186 End stage renal disease: Secondary | ICD-10-CM | POA: Diagnosis not present

## 2023-02-10 DIAGNOSIS — Z992 Dependence on renal dialysis: Secondary | ICD-10-CM | POA: Diagnosis not present

## 2023-02-10 DIAGNOSIS — Z23 Encounter for immunization: Secondary | ICD-10-CM | POA: Diagnosis not present

## 2023-02-11 DIAGNOSIS — N186 End stage renal disease: Secondary | ICD-10-CM | POA: Diagnosis not present

## 2023-02-11 DIAGNOSIS — Z992 Dependence on renal dialysis: Secondary | ICD-10-CM | POA: Diagnosis not present

## 2023-02-11 DIAGNOSIS — Z23 Encounter for immunization: Secondary | ICD-10-CM | POA: Diagnosis not present

## 2023-02-12 DIAGNOSIS — N186 End stage renal disease: Secondary | ICD-10-CM | POA: Diagnosis not present

## 2023-02-12 DIAGNOSIS — Z23 Encounter for immunization: Secondary | ICD-10-CM | POA: Diagnosis not present

## 2023-02-12 DIAGNOSIS — Z992 Dependence on renal dialysis: Secondary | ICD-10-CM | POA: Diagnosis not present

## 2023-02-13 DIAGNOSIS — Z992 Dependence on renal dialysis: Secondary | ICD-10-CM | POA: Diagnosis not present

## 2023-02-13 DIAGNOSIS — N186 End stage renal disease: Secondary | ICD-10-CM | POA: Diagnosis not present

## 2023-02-14 DIAGNOSIS — N186 End stage renal disease: Secondary | ICD-10-CM | POA: Diagnosis not present

## 2023-02-14 DIAGNOSIS — Z992 Dependence on renal dialysis: Secondary | ICD-10-CM | POA: Diagnosis not present

## 2023-02-15 DIAGNOSIS — Z992 Dependence on renal dialysis: Secondary | ICD-10-CM | POA: Diagnosis not present

## 2023-02-15 DIAGNOSIS — N186 End stage renal disease: Secondary | ICD-10-CM | POA: Diagnosis not present

## 2023-02-16 DIAGNOSIS — N186 End stage renal disease: Secondary | ICD-10-CM | POA: Diagnosis not present

## 2023-02-16 DIAGNOSIS — Z992 Dependence on renal dialysis: Secondary | ICD-10-CM | POA: Diagnosis not present

## 2023-02-17 DIAGNOSIS — Z992 Dependence on renal dialysis: Secondary | ICD-10-CM | POA: Diagnosis not present

## 2023-02-17 DIAGNOSIS — N186 End stage renal disease: Secondary | ICD-10-CM | POA: Diagnosis not present

## 2023-02-18 DIAGNOSIS — Z992 Dependence on renal dialysis: Secondary | ICD-10-CM | POA: Diagnosis not present

## 2023-02-18 DIAGNOSIS — N186 End stage renal disease: Secondary | ICD-10-CM | POA: Diagnosis not present

## 2023-02-19 DIAGNOSIS — N186 End stage renal disease: Secondary | ICD-10-CM | POA: Diagnosis not present

## 2023-02-19 DIAGNOSIS — S52502D Unspecified fracture of the lower end of left radius, subsequent encounter for closed fracture with routine healing: Secondary | ICD-10-CM | POA: Diagnosis not present

## 2023-02-19 DIAGNOSIS — Z992 Dependence on renal dialysis: Secondary | ICD-10-CM | POA: Diagnosis not present

## 2023-02-20 DIAGNOSIS — N186 End stage renal disease: Secondary | ICD-10-CM | POA: Diagnosis not present

## 2023-02-20 DIAGNOSIS — Z992 Dependence on renal dialysis: Secondary | ICD-10-CM | POA: Diagnosis not present

## 2023-02-21 DIAGNOSIS — Z992 Dependence on renal dialysis: Secondary | ICD-10-CM | POA: Diagnosis not present

## 2023-02-21 DIAGNOSIS — N186 End stage renal disease: Secondary | ICD-10-CM | POA: Diagnosis not present

## 2023-02-22 DIAGNOSIS — Z992 Dependence on renal dialysis: Secondary | ICD-10-CM | POA: Diagnosis not present

## 2023-02-22 DIAGNOSIS — N186 End stage renal disease: Secondary | ICD-10-CM | POA: Diagnosis not present

## 2023-02-23 DIAGNOSIS — N186 End stage renal disease: Secondary | ICD-10-CM | POA: Diagnosis not present

## 2023-02-23 DIAGNOSIS — Z992 Dependence on renal dialysis: Secondary | ICD-10-CM | POA: Diagnosis not present

## 2023-02-24 DIAGNOSIS — N186 End stage renal disease: Secondary | ICD-10-CM | POA: Diagnosis not present

## 2023-02-24 DIAGNOSIS — Z992 Dependence on renal dialysis: Secondary | ICD-10-CM | POA: Diagnosis not present

## 2023-02-25 ENCOUNTER — Ambulatory Visit: Payer: Medicare Other | Admitting: Cardiology

## 2023-02-25 DIAGNOSIS — Z992 Dependence on renal dialysis: Secondary | ICD-10-CM | POA: Diagnosis not present

## 2023-02-25 DIAGNOSIS — N186 End stage renal disease: Secondary | ICD-10-CM | POA: Diagnosis not present

## 2023-02-25 NOTE — Progress Notes (Deleted)
Cardiology Office Note:  .   Date:  02/25/2023  ID:  Chase Clark, DOB 12/17/1965, MRN [id number] PCP: Glori Luis, MD  [facility] Providers Cardiologist:  Yvonne Kendall, MD { Click to update primary MD,subspecialty MD or APP then REFRESH:1}   History of Present Illness: .   Chase Clark is a 57 y.o. male with a past medical history of coronary artery disease with history of remote PCI and subsequent CABG in 01/2019 (off-pump LIMA-LAD and SVG-D1) after unsuccessful attempted PCI, hypertension, hyperlipidemia, insulin-dependent diabetes, end-stage renal disease on peritoneal dialysis, left BKA, and Gilbert's syndrome, who is here today for follow-up of his coronary artery disease.   Previously been followed by Dr. Loni Dolly clinic.  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  And in October 2020 he presented to the ED at [facility] for left shoulder and arm pain.  Seen by Dr. [place].  He ruled in for STEMI and was taken to the Cath Lab by Dr. Gery Pray where he was noted to have LAD disease.  He underwent attempted PCI to the mid LAD but due to tuberosity and calcifications wires failed to cross the lesion.  He was referred to [facility] for consideration of CABG with Dr. Cliffton Asters.  In 01/27/2019 underwent CABG x 2 off-pump with Dr. Cliffton Asters.  Surgery was tolerated well.  He did have some blurry vision in his left eye postoperatively which improved.  But noted this may have been a chronic issue.  He was seen by nephrology due to to known CKD.  He required Flomax for urinary retention. He was undergoing kidney transplant evaluation in June he was noted to have poor exercise tolerance on CPX in 02/2022.  He attributed that to pain that he had his right hip and notes that he could have walked further if necessary.  Lexiscan MPI  04/2021 was normal  without evidence of ischemia or scar.   He was last seen in clinic 10/02/2022 by Dr. He.  He had been feeling fairly well although had been bothered by right hip pain and lower back pain.  He did report that he was getting out of breath walking between 80 to 100 feet.  However he felt that his pain was his biggest limiting factor.  He did endorse occasional palpitations that awoke him up at night typically only lasting for few seconds.  His systolic blood pressure was less than 100 mmHg.  Blood pressures were typically well-controlled though occasionally noted some spikes.  No medication changes made and no further testing that was ordered at that time.  He returns to clinic today  ROS: 10 point review of systems has been reviewed and considered negative with exception was been listed in HPI  Studies Reviewed: .       2D echo 08/11/2020 1. Left ventricular ejection fraction, by estimation, is 65 to 70%. The  left ventricle has normal function. The left ventricle has no regional  wall motion abnormalities. There is moderate left ventricular hypertrophy.  Left ventricular diastolic  parameters were normal.   2. Right ventricular systolic function is normal. The right ventricular  size is normal.   3. The mitral valve is degenerative. Trivial mitral valve regurgitation.  No evidence of mitral stenosis.   4. The aortic valve is normal in structure. Aortic valve regurgitation is  not visualized. No aortic  stenosis is present.   5. The inferior vena cava is normal in size with greater than 50%  respiratory variability, suggesting right atrial pressure of 3 mmHg.   LHC 01/20/2019 Acute Mrg lesion is 60% stenosed. Previously placed 1st Diag stent (unknown type) is widely patent. Mid LAD lesion is 90% stenosed. Risk Assessment/Calculations:     No BP recorded.  {Refresh Note OR Click here to enter BP  :1}***       Physical Exam:   VS:  There were no vitals taken for this visit.   Wt Readings  from Last 3 Encounters:  10/06/22 268 lb (121.6 kg)  09/24/22 266 lb (120.7 kg)  09/16/22 263 lb (119.3 kg)    GEN: Well nourished, well developed in no acute distress NECK: No JVD; No carotid bruits CARDIAC: ***RRR, no murmurs, rubs, gallops RESPIRATORY:  Clear to auscultation without rales, wheezing or rhonchi  ABDOMEN: Soft, non-tender, non-distended EXTREMITIES:  No edema; No deformity   ASSESSMENT AND PLAN: .   ***    {Are you ordering a CV Procedure (e.g. stress test, cath, DCCV, TEE, etc)?   Press F2        :[id number]}  Dispo: ***  Signed, Mckynlee Luse, NP

## 2023-02-26 DIAGNOSIS — N186 End stage renal disease: Secondary | ICD-10-CM | POA: Diagnosis not present

## 2023-02-26 DIAGNOSIS — Z992 Dependence on renal dialysis: Secondary | ICD-10-CM | POA: Diagnosis not present

## 2023-02-27 DIAGNOSIS — Z992 Dependence on renal dialysis: Secondary | ICD-10-CM | POA: Diagnosis not present

## 2023-02-27 DIAGNOSIS — N186 End stage renal disease: Secondary | ICD-10-CM | POA: Diagnosis not present

## 2023-02-28 DIAGNOSIS — Z992 Dependence on renal dialysis: Secondary | ICD-10-CM | POA: Diagnosis not present

## 2023-02-28 DIAGNOSIS — N186 End stage renal disease: Secondary | ICD-10-CM | POA: Diagnosis not present

## 2023-03-01 DIAGNOSIS — Z992 Dependence on renal dialysis: Secondary | ICD-10-CM | POA: Diagnosis not present

## 2023-03-01 DIAGNOSIS — N186 End stage renal disease: Secondary | ICD-10-CM | POA: Diagnosis not present

## 2023-03-02 DIAGNOSIS — Z992 Dependence on renal dialysis: Secondary | ICD-10-CM | POA: Diagnosis not present

## 2023-03-02 DIAGNOSIS — N186 End stage renal disease: Secondary | ICD-10-CM | POA: Diagnosis not present

## 2023-03-03 DIAGNOSIS — N186 End stage renal disease: Secondary | ICD-10-CM | POA: Diagnosis not present

## 2023-03-03 DIAGNOSIS — Z992 Dependence on renal dialysis: Secondary | ICD-10-CM | POA: Diagnosis not present

## 2023-03-04 DIAGNOSIS — N186 End stage renal disease: Secondary | ICD-10-CM | POA: Diagnosis not present

## 2023-03-04 DIAGNOSIS — Z992 Dependence on renal dialysis: Secondary | ICD-10-CM | POA: Diagnosis not present

## 2023-03-05 DIAGNOSIS — Z992 Dependence on renal dialysis: Secondary | ICD-10-CM | POA: Diagnosis not present

## 2023-03-05 DIAGNOSIS — N186 End stage renal disease: Secondary | ICD-10-CM | POA: Diagnosis not present

## 2023-03-06 DIAGNOSIS — N186 End stage renal disease: Secondary | ICD-10-CM | POA: Diagnosis not present

## 2023-03-06 DIAGNOSIS — Z992 Dependence on renal dialysis: Secondary | ICD-10-CM | POA: Diagnosis not present

## 2023-03-07 DIAGNOSIS — N186 End stage renal disease: Secondary | ICD-10-CM | POA: Diagnosis not present

## 2023-03-07 DIAGNOSIS — Z992 Dependence on renal dialysis: Secondary | ICD-10-CM | POA: Diagnosis not present

## 2023-03-08 DIAGNOSIS — N186 End stage renal disease: Secondary | ICD-10-CM | POA: Diagnosis not present

## 2023-03-08 DIAGNOSIS — Z992 Dependence on renal dialysis: Secondary | ICD-10-CM | POA: Diagnosis not present

## 2023-03-09 DIAGNOSIS — N186 End stage renal disease: Secondary | ICD-10-CM | POA: Diagnosis not present

## 2023-03-09 DIAGNOSIS — Z992 Dependence on renal dialysis: Secondary | ICD-10-CM | POA: Diagnosis not present

## 2023-03-10 DIAGNOSIS — N186 End stage renal disease: Secondary | ICD-10-CM | POA: Diagnosis not present

## 2023-03-10 DIAGNOSIS — Z992 Dependence on renal dialysis: Secondary | ICD-10-CM | POA: Diagnosis not present

## 2023-03-11 DIAGNOSIS — N186 End stage renal disease: Secondary | ICD-10-CM | POA: Diagnosis not present

## 2023-03-11 DIAGNOSIS — Z992 Dependence on renal dialysis: Secondary | ICD-10-CM | POA: Diagnosis not present

## 2023-03-12 DIAGNOSIS — N186 End stage renal disease: Secondary | ICD-10-CM | POA: Diagnosis not present

## 2023-03-12 DIAGNOSIS — Z992 Dependence on renal dialysis: Secondary | ICD-10-CM | POA: Diagnosis not present

## 2023-03-13 DIAGNOSIS — N186 End stage renal disease: Secondary | ICD-10-CM | POA: Diagnosis not present

## 2023-03-13 DIAGNOSIS — Z992 Dependence on renal dialysis: Secondary | ICD-10-CM | POA: Diagnosis not present

## 2023-03-14 DIAGNOSIS — N186 End stage renal disease: Secondary | ICD-10-CM | POA: Diagnosis not present

## 2023-03-14 DIAGNOSIS — Z992 Dependence on renal dialysis: Secondary | ICD-10-CM | POA: Diagnosis not present

## 2023-03-15 DIAGNOSIS — Z992 Dependence on renal dialysis: Secondary | ICD-10-CM | POA: Diagnosis not present

## 2023-03-15 DIAGNOSIS — N186 End stage renal disease: Secondary | ICD-10-CM | POA: Diagnosis not present

## 2023-03-16 DIAGNOSIS — N186 End stage renal disease: Secondary | ICD-10-CM | POA: Diagnosis not present

## 2023-03-16 DIAGNOSIS — Z992 Dependence on renal dialysis: Secondary | ICD-10-CM | POA: Diagnosis not present

## 2023-03-17 ENCOUNTER — Ambulatory Visit: Payer: Medicare Other | Admitting: Cardiology

## 2023-03-17 DIAGNOSIS — N186 End stage renal disease: Secondary | ICD-10-CM | POA: Diagnosis not present

## 2023-03-17 DIAGNOSIS — Z992 Dependence on renal dialysis: Secondary | ICD-10-CM | POA: Diagnosis not present

## 2023-03-18 DIAGNOSIS — Z992 Dependence on renal dialysis: Secondary | ICD-10-CM | POA: Diagnosis not present

## 2023-03-18 DIAGNOSIS — N186 End stage renal disease: Secondary | ICD-10-CM | POA: Diagnosis not present

## 2023-03-19 DIAGNOSIS — N186 End stage renal disease: Secondary | ICD-10-CM | POA: Diagnosis not present

## 2023-03-19 DIAGNOSIS — Z992 Dependence on renal dialysis: Secondary | ICD-10-CM | POA: Diagnosis not present

## 2023-03-20 DIAGNOSIS — N186 End stage renal disease: Secondary | ICD-10-CM | POA: Diagnosis not present

## 2023-03-20 DIAGNOSIS — Z992 Dependence on renal dialysis: Secondary | ICD-10-CM | POA: Diagnosis not present

## 2023-03-21 DIAGNOSIS — Z992 Dependence on renal dialysis: Secondary | ICD-10-CM | POA: Diagnosis not present

## 2023-03-21 DIAGNOSIS — N186 End stage renal disease: Secondary | ICD-10-CM | POA: Diagnosis not present

## 2023-03-22 DIAGNOSIS — Z992 Dependence on renal dialysis: Secondary | ICD-10-CM | POA: Diagnosis not present

## 2023-03-22 DIAGNOSIS — N186 End stage renal disease: Secondary | ICD-10-CM | POA: Diagnosis not present

## 2023-03-23 DIAGNOSIS — Z992 Dependence on renal dialysis: Secondary | ICD-10-CM | POA: Diagnosis not present

## 2023-03-23 DIAGNOSIS — N186 End stage renal disease: Secondary | ICD-10-CM | POA: Diagnosis not present

## 2023-03-24 DIAGNOSIS — Z992 Dependence on renal dialysis: Secondary | ICD-10-CM | POA: Diagnosis not present

## 2023-03-24 DIAGNOSIS — N186 End stage renal disease: Secondary | ICD-10-CM | POA: Diagnosis not present

## 2023-03-25 DIAGNOSIS — Z992 Dependence on renal dialysis: Secondary | ICD-10-CM | POA: Diagnosis not present

## 2023-03-25 DIAGNOSIS — N186 End stage renal disease: Secondary | ICD-10-CM | POA: Diagnosis not present

## 2023-03-26 ENCOUNTER — Ambulatory Visit: Payer: Medicare Other | Admitting: Internal Medicine

## 2023-03-26 DIAGNOSIS — Z992 Dependence on renal dialysis: Secondary | ICD-10-CM | POA: Diagnosis not present

## 2023-03-26 DIAGNOSIS — N186 End stage renal disease: Secondary | ICD-10-CM | POA: Diagnosis not present

## 2023-03-27 ENCOUNTER — Ambulatory Visit: Payer: Medicare Other | Admitting: Cardiology

## 2023-03-27 DIAGNOSIS — Z992 Dependence on renal dialysis: Secondary | ICD-10-CM | POA: Diagnosis not present

## 2023-03-27 DIAGNOSIS — N186 End stage renal disease: Secondary | ICD-10-CM | POA: Diagnosis not present

## 2023-03-28 DIAGNOSIS — N186 End stage renal disease: Secondary | ICD-10-CM | POA: Diagnosis not present

## 2023-03-28 DIAGNOSIS — Z992 Dependence on renal dialysis: Secondary | ICD-10-CM | POA: Diagnosis not present

## 2023-03-29 DIAGNOSIS — N186 End stage renal disease: Secondary | ICD-10-CM | POA: Diagnosis not present

## 2023-03-29 DIAGNOSIS — Z992 Dependence on renal dialysis: Secondary | ICD-10-CM | POA: Diagnosis not present

## 2023-03-30 ENCOUNTER — Other Ambulatory Visit: Payer: Self-pay

## 2023-03-30 DIAGNOSIS — N186 End stage renal disease: Secondary | ICD-10-CM | POA: Diagnosis not present

## 2023-03-30 DIAGNOSIS — Z992 Dependence on renal dialysis: Secondary | ICD-10-CM | POA: Diagnosis not present

## 2023-03-30 MED ORDER — MOMETASONE FUROATE 0.1 % EX CREA: 1.0000 | TOPICAL_CREAM | CUTANEOUS | 2 refills | Status: DC

## 2023-03-30 NOTE — Progress Notes (Signed)
Fax received from [facility] for refill request.

## 2023-03-31 DIAGNOSIS — N186 End stage renal disease: Secondary | ICD-10-CM | POA: Diagnosis not present

## 2023-03-31 DIAGNOSIS — Z992 Dependence on renal dialysis: Secondary | ICD-10-CM | POA: Diagnosis not present

## 2023-04-01 ENCOUNTER — Encounter: Payer: Self-pay | Admitting: Nephrology

## 2023-04-01 DIAGNOSIS — Z992 Dependence on renal dialysis: Secondary | ICD-10-CM | POA: Diagnosis not present

## 2023-04-01 DIAGNOSIS — N186 End stage renal disease: Secondary | ICD-10-CM

## 2023-04-01 DIAGNOSIS — T85611A Breakdown (mechanical) of intraperitoneal dialysis catheter, initial encounter: Secondary | ICD-10-CM

## 2023-04-02 DIAGNOSIS — N186 End stage renal disease: Secondary | ICD-10-CM | POA: Diagnosis not present

## 2023-04-02 DIAGNOSIS — Z992 Dependence on renal dialysis: Secondary | ICD-10-CM | POA: Diagnosis not present

## 2023-04-02 DIAGNOSIS — S52502D Unspecified fracture of the lower end of left radius, subsequent encounter for closed fracture with routine healing: Secondary | ICD-10-CM | POA: Diagnosis not present

## 2023-04-03 DIAGNOSIS — N186 End stage renal disease: Secondary | ICD-10-CM | POA: Diagnosis not present

## 2023-04-03 DIAGNOSIS — Z992 Dependence on renal dialysis: Secondary | ICD-10-CM | POA: Diagnosis not present

## 2023-04-04 DIAGNOSIS — N186 End stage renal disease: Secondary | ICD-10-CM | POA: Diagnosis not present

## 2023-04-04 DIAGNOSIS — Z992 Dependence on renal dialysis: Secondary | ICD-10-CM | POA: Diagnosis not present

## 2023-04-05 DIAGNOSIS — Z992 Dependence on renal dialysis: Secondary | ICD-10-CM | POA: Diagnosis not present

## 2023-04-05 DIAGNOSIS — N186 End stage renal disease: Secondary | ICD-10-CM | POA: Diagnosis not present

## 2023-04-06 DIAGNOSIS — N186 End stage renal disease: Secondary | ICD-10-CM | POA: Diagnosis not present

## 2023-04-06 DIAGNOSIS — Z992 Dependence on renal dialysis: Secondary | ICD-10-CM | POA: Diagnosis not present

## 2023-04-07 DIAGNOSIS — Z992 Dependence on renal dialysis: Secondary | ICD-10-CM | POA: Diagnosis not present

## 2023-04-07 DIAGNOSIS — N186 End stage renal disease: Secondary | ICD-10-CM | POA: Diagnosis not present

## 2023-04-08 DIAGNOSIS — Z992 Dependence on renal dialysis: Secondary | ICD-10-CM | POA: Diagnosis not present

## 2023-04-08 DIAGNOSIS — N186 End stage renal disease: Secondary | ICD-10-CM | POA: Diagnosis not present

## 2023-04-09 DIAGNOSIS — Z992 Dependence on renal dialysis: Secondary | ICD-10-CM | POA: Diagnosis not present

## 2023-04-09 DIAGNOSIS — N186 End stage renal disease: Secondary | ICD-10-CM | POA: Diagnosis not present

## 2023-04-10 DIAGNOSIS — G5602 Carpal tunnel syndrome, left upper limb: Secondary | ICD-10-CM | POA: Diagnosis not present

## 2023-04-10 DIAGNOSIS — N186 End stage renal disease: Secondary | ICD-10-CM | POA: Diagnosis not present

## 2023-04-10 DIAGNOSIS — Z992 Dependence on renal dialysis: Secondary | ICD-10-CM | POA: Diagnosis not present

## 2023-04-10 DIAGNOSIS — S52502D Unspecified fracture of the lower end of left radius, subsequent encounter for closed fracture with routine healing: Secondary | ICD-10-CM | POA: Diagnosis not present

## 2023-04-11 DIAGNOSIS — N186 End stage renal disease: Secondary | ICD-10-CM | POA: Diagnosis not present

## 2023-04-11 DIAGNOSIS — Z992 Dependence on renal dialysis: Secondary | ICD-10-CM | POA: Diagnosis not present

## 2023-04-12 DIAGNOSIS — N186 End stage renal disease: Secondary | ICD-10-CM | POA: Diagnosis not present

## 2023-04-12 DIAGNOSIS — Z992 Dependence on renal dialysis: Secondary | ICD-10-CM | POA: Diagnosis not present

## 2023-04-13 DIAGNOSIS — N186 End stage renal disease: Secondary | ICD-10-CM | POA: Diagnosis not present

## 2023-04-13 DIAGNOSIS — Z992 Dependence on renal dialysis: Secondary | ICD-10-CM | POA: Diagnosis not present

## 2023-04-13 DIAGNOSIS — S52502D Unspecified fracture of the lower end of left radius, subsequent encounter for closed fracture with routine healing: Secondary | ICD-10-CM | POA: Diagnosis not present

## 2023-04-14 DIAGNOSIS — N186 End stage renal disease: Secondary | ICD-10-CM | POA: Diagnosis not present

## 2023-04-14 DIAGNOSIS — Z992 Dependence on renal dialysis: Secondary | ICD-10-CM | POA: Diagnosis not present

## 2023-04-15 DIAGNOSIS — N186 End stage renal disease: Secondary | ICD-10-CM | POA: Diagnosis not present

## 2023-04-15 DIAGNOSIS — Z992 Dependence on renal dialysis: Secondary | ICD-10-CM | POA: Diagnosis not present

## 2023-04-16 DIAGNOSIS — N186 End stage renal disease: Secondary | ICD-10-CM | POA: Diagnosis not present

## 2023-04-16 DIAGNOSIS — Z992 Dependence on renal dialysis: Secondary | ICD-10-CM | POA: Diagnosis not present

## 2023-04-17 DIAGNOSIS — N186 End stage renal disease: Secondary | ICD-10-CM | POA: Diagnosis not present

## 2023-04-17 DIAGNOSIS — Z992 Dependence on renal dialysis: Secondary | ICD-10-CM | POA: Diagnosis not present

## 2023-04-18 DIAGNOSIS — Z992 Dependence on renal dialysis: Secondary | ICD-10-CM | POA: Diagnosis not present

## 2023-04-18 DIAGNOSIS — N186 End stage renal disease: Secondary | ICD-10-CM | POA: Diagnosis not present

## 2023-04-19 DIAGNOSIS — N186 End stage renal disease: Secondary | ICD-10-CM | POA: Diagnosis not present

## 2023-04-19 DIAGNOSIS — Z992 Dependence on renal dialysis: Secondary | ICD-10-CM | POA: Diagnosis not present

## 2023-04-20 DIAGNOSIS — N186 End stage renal disease: Secondary | ICD-10-CM | POA: Diagnosis not present

## 2023-04-20 DIAGNOSIS — Z992 Dependence on renal dialysis: Secondary | ICD-10-CM | POA: Diagnosis not present

## 2023-04-21 DIAGNOSIS — N186 End stage renal disease: Secondary | ICD-10-CM | POA: Diagnosis not present

## 2023-04-21 DIAGNOSIS — Z992 Dependence on renal dialysis: Secondary | ICD-10-CM | POA: Diagnosis not present

## 2023-04-21 DIAGNOSIS — S52502D Unspecified fracture of the lower end of left radius, subsequent encounter for closed fracture with routine healing: Secondary | ICD-10-CM | POA: Diagnosis not present

## 2023-04-22 DIAGNOSIS — N186 End stage renal disease: Secondary | ICD-10-CM | POA: Diagnosis not present

## 2023-04-22 DIAGNOSIS — Z992 Dependence on renal dialysis: Secondary | ICD-10-CM | POA: Diagnosis not present

## 2023-04-23 DIAGNOSIS — N186 End stage renal disease: Secondary | ICD-10-CM | POA: Diagnosis not present

## 2023-04-23 DIAGNOSIS — Z992 Dependence on renal dialysis: Secondary | ICD-10-CM | POA: Diagnosis not present

## 2023-04-24 DIAGNOSIS — N186 End stage renal disease: Secondary | ICD-10-CM | POA: Diagnosis not present

## 2023-04-24 DIAGNOSIS — Z992 Dependence on renal dialysis: Secondary | ICD-10-CM | POA: Diagnosis not present

## 2023-04-25 DIAGNOSIS — Z992 Dependence on renal dialysis: Secondary | ICD-10-CM | POA: Diagnosis not present

## 2023-04-25 DIAGNOSIS — N186 End stage renal disease: Secondary | ICD-10-CM | POA: Diagnosis not present

## 2023-04-26 DIAGNOSIS — Z992 Dependence on renal dialysis: Secondary | ICD-10-CM | POA: Diagnosis not present

## 2023-04-26 DIAGNOSIS — N186 End stage renal disease: Secondary | ICD-10-CM | POA: Diagnosis not present

## 2023-04-27 DIAGNOSIS — Z992 Dependence on renal dialysis: Secondary | ICD-10-CM | POA: Diagnosis not present

## 2023-04-27 DIAGNOSIS — N186 End stage renal disease: Secondary | ICD-10-CM | POA: Diagnosis not present

## 2023-04-28 DIAGNOSIS — N186 End stage renal disease: Secondary | ICD-10-CM | POA: Diagnosis not present

## 2023-04-28 DIAGNOSIS — Z992 Dependence on renal dialysis: Secondary | ICD-10-CM | POA: Diagnosis not present

## 2023-04-29 ENCOUNTER — Ambulatory Visit
Admission: RE | Admit: 2023-04-29 | Discharge: 2023-04-29 | Disposition: A | Discharge status: Home / Self Care | Payer: Medicare Other | Attending: Nephrology | Admitting: Nephrology

## 2023-04-29 ENCOUNTER — Ambulatory Visit
Admission: RE | Admit: 2023-04-29 | Discharge: 2023-04-29 | Disposition: A | Discharge status: Home / Self Care | Payer: Medicare Other | Source: Ambulatory Visit | Attending: Nephrology | Admitting: Nephrology

## 2023-04-29 DIAGNOSIS — Z992 Dependence on renal dialysis: Secondary | ICD-10-CM | POA: Diagnosis not present

## 2023-04-29 DIAGNOSIS — Z9049 Acquired absence of other specified parts of digestive tract: Secondary | ICD-10-CM | POA: Diagnosis not present

## 2023-04-29 DIAGNOSIS — N186 End stage renal disease: Secondary | ICD-10-CM | POA: Diagnosis not present

## 2023-04-29 DIAGNOSIS — T85611A Breakdown (mechanical) of intraperitoneal dialysis catheter, initial encounter: Secondary | ICD-10-CM | POA: Insufficient documentation

## 2023-04-29 DIAGNOSIS — S52502D Unspecified fracture of the lower end of left radius, subsequent encounter for closed fracture with routine healing: Secondary | ICD-10-CM | POA: Diagnosis not present

## 2023-04-29 DIAGNOSIS — Z4902 Encounter for fitting and adjustment of peritoneal dialysis catheter: Secondary | ICD-10-CM | POA: Diagnosis not present

## 2023-04-30 DIAGNOSIS — Z992 Dependence on renal dialysis: Secondary | ICD-10-CM | POA: Diagnosis not present

## 2023-04-30 DIAGNOSIS — N186 End stage renal disease: Secondary | ICD-10-CM | POA: Diagnosis not present

## 2023-05-02 DIAGNOSIS — N186 End stage renal disease: Secondary | ICD-10-CM | POA: Diagnosis not present

## 2023-05-02 DIAGNOSIS — Z992 Dependence on renal dialysis: Secondary | ICD-10-CM | POA: Diagnosis not present

## 2023-05-03 DIAGNOSIS — Z992 Dependence on renal dialysis: Secondary | ICD-10-CM | POA: Diagnosis not present

## 2023-05-03 DIAGNOSIS — N186 End stage renal disease: Secondary | ICD-10-CM | POA: Diagnosis not present

## 2023-05-04 ENCOUNTER — Ambulatory Visit: Payer: Medicare Other | Attending: Cardiology | Admitting: Cardiology

## 2023-05-04 DIAGNOSIS — N186 End stage renal disease: Secondary | ICD-10-CM | POA: Diagnosis not present

## 2023-05-04 DIAGNOSIS — Z794 Long term (current) use of insulin: Secondary | ICD-10-CM | POA: Diagnosis not present

## 2023-05-04 DIAGNOSIS — E119 Type 2 diabetes mellitus without complications: Secondary | ICD-10-CM | POA: Diagnosis not present

## 2023-05-04 DIAGNOSIS — Z992 Dependence on renal dialysis: Secondary | ICD-10-CM | POA: Diagnosis not present

## 2023-05-04 NOTE — Progress Notes (Deleted)
Cardiology Office Note:  .   Date:  05/04/2023  ID:  Chase Clark, DOB May 09, 1965, MRN [id number] PCP: Chase Luis, MD  [facility] Providers Cardiologist:  Yvonne Kendall, MD { Click to update primary MD,subspecialty MD or APP then REFRESH:1}   History of Present Illness: .   Chase Clark is a 58 y.o. male with a past medical history of coronary artery disease with history of remote PCI and subsequent CABG (01/2019, off-pump LIMA-LAD and SVG-D1), hypertension, hyperlipidemia, insulin-dependent diabetes, end-stage renal disease on peritoneal dialysis, left BKA, and Gilbert syndrome, who is here today for follow-up.   Previously had been followed by Dr. Lady Gary [facility] having last been seen in 08/2021.  At that time he had been doing fairly well.  Most recent ischemic evaluation was a pharmacologic MPI at [facility] in 04/2021, which was normal without evidence of ischemia or scar.  He had recently been diagnosed with poor exercise capacity during his CPX, though he attributes this to primarily hip pain.  It was recommended at that time he participate in cardiac rehab to try to help improve his functional capacity.  He was also recommended to continue working with the transplant team at [facility] and Dr. Thedore Mins for continued peritoneal dialysis and transplant workup.   He was last seen in clinic 10/06/2022 by Dr. Okey Dupre.  At that time he had been feeling fairly well though he had been bothered by his right hip and low back pain.  He was undergoing kidney transplant evaluation at [facility] and had previously been noted to have poor exercise tolerance during CPX.  He attributed low function to pain.  There were no changes that were made in his medication regimen and no further testing that was ordered at the time.  He returns to clinic today  ROS: 10 point review of system has been reviewed and considered negative with exception was been listed in HPI  Studies Reviewed: Marland Kitchen      CPX ([facility])  03/03/2022   1) The data suggests a maximal exercise test, so maximal    cardiorespiratory capacity could be determined.    2) Based on the peak oxygen consumption (VO2) achieved, functional capacity    would be consistent with a severe functional impairment, with peak VO2 8.3    ml/kg/min (32.9% of predicted normals). Normative peak predicted VO2    values are corrected for age, sex, and weight.    3) There were maximal signs of a circulatory limitation to exercise, with    evidence of blunted stroke volume and blood pressure augmentation with    exercise. VE-VCO2 slope was 27.0 (98% of predicted normals) with an OUES of    1067. ECG notable for baseline normal sinus rhythm. PVC's were noted    during the procedure. There were no ST segment changes with exercise. The    heart rate recovery was abnormal for this test.    4) Aerobic reserve was poor, likely due to physical deconditioning limiting    exercise capacity.    5) Ventilatory reserve limits were not approached at peak exercise without    apparent pulmonary limitation to exercise. Resting spirometry suggests a    mild obstruction. The MVV is normal. Exercise oscillatory ventilation    criteria was not met.    6) The O2 pulse (an indicator of stroke volume) increased throughout    exercise.    7) No prior CPX available for comparison.    This is the metabolic  report for the Cardiopulmonary Exercise Test. The    Stress ECG report is available on a separate report.  2D echo 08/11/2020 1. Left ventricular ejection fraction, by estimation, is 65 to 70%. The  left ventricle has normal function. The left ventricle has no regional  wall motion abnormalities. There is moderate left ventricular hypertrophy.  Left ventricular diastolic  parameters were normal.   2. Right ventricular systolic function is normal. The right ventricular  size is normal.   3. The mitral valve is degenerative. Trivial mitral valve regurgitation.  No evidence  of mitral stenosis.   4. The aortic valve is normal in structure. Aortic valve regurgitation is  not visualized. No aortic stenosis is present.   5. The inferior vena cava is normal in size with greater than 50%  respiratory variability, suggesting right atrial pressure of 3 mmHg.   Risk Assessment/Calculations:     No BP recorded.  {Refresh Note OR Click here to enter BP  :1}***       Physical Exam:   VS:  There were no vitals taken for this visit.   Wt Readings from Last 3 Encounters:  10/06/22 268 lb (121.6 kg)  09/24/22 266 lb (120.7 kg)  09/16/22 263 lb (119.3 kg)    GEN: Well nourished, well developed in no acute distress NECK: No JVD; No carotid bruits CARDIAC: ***RRR, no murmurs, rubs, gallops RESPIRATORY:  Clear to auscultation without rales, wheezing or rhonchi  ABDOMEN: Soft, non-tender, non-distended EXTREMITIES:  No edema; No deformity   ASSESSMENT AND PLAN: .   ***    {Are you ordering a CV Procedure (e.g. stress test, cath, DCCV, TEE, etc)?   Press F2        :[id number]}  Dispo: ***  Signed, Miasia Crabtree, NP

## 2023-05-05 DIAGNOSIS — N186 End stage renal disease: Secondary | ICD-10-CM | POA: Diagnosis not present

## 2023-05-05 DIAGNOSIS — Z992 Dependence on renal dialysis: Secondary | ICD-10-CM | POA: Diagnosis not present

## 2023-05-06 ENCOUNTER — Encounter (INDEPENDENT_AMBULATORY_CARE_PROVIDER_SITE_OTHER): Payer: Self-pay

## 2023-05-06 DIAGNOSIS — Z992 Dependence on renal dialysis: Secondary | ICD-10-CM | POA: Diagnosis not present

## 2023-05-06 DIAGNOSIS — N186 End stage renal disease: Secondary | ICD-10-CM | POA: Diagnosis not present

## 2023-05-07 DIAGNOSIS — N186 End stage renal disease: Secondary | ICD-10-CM | POA: Diagnosis not present

## 2023-05-07 DIAGNOSIS — Z992 Dependence on renal dialysis: Secondary | ICD-10-CM | POA: Diagnosis not present

## 2023-05-08 DIAGNOSIS — Z992 Dependence on renal dialysis: Secondary | ICD-10-CM | POA: Diagnosis not present

## 2023-05-08 DIAGNOSIS — N186 End stage renal disease: Secondary | ICD-10-CM | POA: Diagnosis not present

## 2023-05-09 DIAGNOSIS — Z992 Dependence on renal dialysis: Secondary | ICD-10-CM | POA: Diagnosis not present

## 2023-05-09 DIAGNOSIS — N186 End stage renal disease: Secondary | ICD-10-CM | POA: Diagnosis not present

## 2023-05-10 DIAGNOSIS — Z992 Dependence on renal dialysis: Secondary | ICD-10-CM | POA: Diagnosis not present

## 2023-05-10 DIAGNOSIS — N186 End stage renal disease: Secondary | ICD-10-CM | POA: Diagnosis not present

## 2023-05-11 DIAGNOSIS — Z992 Dependence on renal dialysis: Secondary | ICD-10-CM | POA: Diagnosis not present

## 2023-05-11 DIAGNOSIS — N186 End stage renal disease: Secondary | ICD-10-CM | POA: Diagnosis not present

## 2023-05-12 ENCOUNTER — Ambulatory Visit: Payer: Managed Care, Other (non HMO) | Admitting: Dermatology

## 2023-05-12 DIAGNOSIS — N186 End stage renal disease: Secondary | ICD-10-CM | POA: Diagnosis not present

## 2023-05-12 DIAGNOSIS — Z992 Dependence on renal dialysis: Secondary | ICD-10-CM | POA: Diagnosis not present

## 2023-05-13 DIAGNOSIS — E113513 Type 2 diabetes mellitus with proliferative diabetic retinopathy with macular edema, bilateral: Secondary | ICD-10-CM | POA: Diagnosis not present

## 2023-05-13 DIAGNOSIS — H4052X3 Glaucoma secondary to other eye disorders, left eye, severe stage: Secondary | ICD-10-CM | POA: Diagnosis not present

## 2023-05-13 DIAGNOSIS — Z992 Dependence on renal dialysis: Secondary | ICD-10-CM | POA: Diagnosis not present

## 2023-05-13 DIAGNOSIS — N186 End stage renal disease: Secondary | ICD-10-CM | POA: Diagnosis not present

## 2023-05-14 DIAGNOSIS — Z992 Dependence on renal dialysis: Secondary | ICD-10-CM | POA: Diagnosis not present

## 2023-05-14 DIAGNOSIS — N186 End stage renal disease: Secondary | ICD-10-CM | POA: Diagnosis not present

## 2023-05-15 ENCOUNTER — Encounter: Payer: Self-pay | Admitting: Internal Medicine

## 2023-05-15 ENCOUNTER — Ambulatory Visit (INDEPENDENT_AMBULATORY_CARE_PROVIDER_SITE_OTHER): Payer: Medicare Other | Admitting: Internal Medicine

## 2023-05-15 VITALS — BP 116/74 | HR 66 | Ht 70.0 in | Wt 255.0 lb

## 2023-05-15 DIAGNOSIS — E1059 Type 1 diabetes mellitus with other circulatory complications: Secondary | ICD-10-CM

## 2023-05-15 DIAGNOSIS — E1022 Type 1 diabetes mellitus with diabetic chronic kidney disease: Secondary | ICD-10-CM | POA: Diagnosis not present

## 2023-05-15 DIAGNOSIS — Z89512 Acquired absence of left leg below knee: Secondary | ICD-10-CM

## 2023-05-15 DIAGNOSIS — E230 Hypopituitarism: Secondary | ICD-10-CM | POA: Diagnosis not present

## 2023-05-15 DIAGNOSIS — E103593 Type 1 diabetes mellitus with proliferative diabetic retinopathy without macular edema, bilateral: Secondary | ICD-10-CM

## 2023-05-15 DIAGNOSIS — S52502D Unspecified fracture of the lower end of left radius, subsequent encounter for closed fracture with routine healing: Secondary | ICD-10-CM | POA: Diagnosis not present

## 2023-05-15 DIAGNOSIS — E1042 Type 1 diabetes mellitus with diabetic polyneuropathy: Secondary | ICD-10-CM | POA: Diagnosis not present

## 2023-05-15 DIAGNOSIS — Z992 Dependence on renal dialysis: Secondary | ICD-10-CM | POA: Diagnosis not present

## 2023-05-15 DIAGNOSIS — N186 End stage renal disease: Secondary | ICD-10-CM | POA: Diagnosis not present

## 2023-05-15 MED ORDER — TESTOSTERONE 20.25 MG/ACT (1.62%) TD GEL: 2.0000 | Freq: Every day | TRANSDERMAL | 5 refills | Status: DC

## 2023-05-15 NOTE — Progress Notes (Signed)
Name: Chase Clark  Age/ Sex: 58 y.o., male   MRN/ DOB: [id number], August 07, 1965     PCP: Glori Luis, MD   Reason for Endocrinology Evaluation: Type 1 Diabetes Mellitus  Initial Endocrine Consultative Visit: 06/29/2019    PATIENT IDENTIFIER: Chase Clark is a 58 y.o. 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 The patient has followed with Endocrinology clinic since 06/29/2019 for consultative assistance with management of his diabetes.  DIABETIC HISTORY:  Chase Clark was diagnosed with  T1DM in 1990, was initially diagnosed as type 2 until years ago when he was diagnosed with T1DM, has been on insulin for ~ 15 yrs. He tried Ozempic 04/2019 with GI intolerance. His hemoglobin A1c has ranged from 5.9%  in 2021, peaking at 15.3%in 2019.    HYPOGONADISM HISTORY: He was noted with low testosterone levels (239.76 NG/DL in July 4098) after presenting to his PCP with gynecomastia, and slight elevation of the prolactin at 32.6 NG/mL with inappropriately normal LH at 7.82mIU/mL .  Mammogram 10/16/2021 was unrevealing except for bilateral gynecomastia and no malignancy  Patient has chronic erectile dysfunction for years No personal history of DVT/PE or prostate cancer  Repeat testosterone levels through [facility] were low at 176 NG/DL 04/1912.  Pituitary MRI did not reveal any adenoma   Topical testosterone December 2023   SUBJECTIVE:   During the last visit (09/24/2022): A1c 6.9%    Today (05/15/2023): Chase Clark is here for a follow up on diabetes management.  He is accompanied by his wife Delice Bison today.He checks his glucose multiple times a day through the dexcom.  No hypoglycemia except when sensor is faulty.   He was evaluated at the kidney/pancreas transplant at [facility] in June 2023, after further evaluation, patient was found to be ineligible due to severe physical limitations  He has  been following up with [facility] for closed fracture of the distal radius, S/P mechanical fall.  Patient follows with GI for GERD and gastroparesis as well as family history of Elita Boone cirrhosis  He continues to have peritoneal dialysis Extron[employer] during the day 2.5 L  At night uses two bags of 6 Liters 2.5 % dextrose ~ 300 grams of CHO    He is on stools softeners BID  Denies nausea or vomiting   He is not on Testosterone for no reason, he just felt lazy about it.   HOME DIABETES REGIMEN:  Tresiba 42 units BID Humalog 10-15 each meal  NPH 70  units at the start of peritoneal dialysis Testosterone gel 2 pumps daily- not taking     Statin: yes ACE-I/ARB: yes   CONTINUOUS GLUCOSE MONITORING RECORD INTERPRETATION    Dates of Recording: 04/02/2023-04/15/2023  Sensor description: Dexcom  Results statistics:   CGM use % of time 125  Average and SD 150/35  Time in range  77  %  % Time Above 180 23  % Time above 250 0  % Time Below target <1   Glycemic patterns summary: BGs are at the upper limit of normal during the night and optimal throughout the day  Hyperglycemic episodes at night  Hypoglycemic episodes occurred n/a  Overnight periods: Variable but mostly at goal       DIABETIC COMPLICATIONS: Microvascular complications:  Neuropathy, CKD IV , 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, + 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  Last Eye Exam: Completed 04/2021   Macrovascular complications:  CAD (  S/P CABG 01/2019)  Denies: CVA, PVD   HISTORY:  Past Medical History:  Past Medical History:  Diagnosis Date   Anemia    Arthritis    Asthma    as child   Atypical mole 09/22/2022   R spinal mid back, needs excision   Barrett's esophagus 02/16/2014   short segment   Cataract    Chronic kidney disease (CKD), stage IV (severe) (HCC)    Coronary artery disease    DDD (degenerative disc disease), cervical    Diabetes mellitus without complication (HCC)    Diabetic osteomyelitis (HCC)    Diabetic peripheral  neuropathy (HCC)    Diabetic ulcer of foot with muscle involvement without evidence of necrosis (HCC)    DIABETIC ULCERATIONS ASSOCIATED WITH IRRITATION LATERAL ANKLE LEFT GREATER THAN RIGHT WITH MILD CELLULITIS    Diverticulosis    DKA (diabetic ketoacidoses) 08/23/2017   Edema, lower extremity    Fatty liver    Gastroparesis 2015   GERD (gastroesophageal reflux disease)    Gilbert's disease    Glaucoma    Hx of BKA, left (HCC)    Hx of CABG 01/27/2019   2 vessels; LIMA-LAD, SVG-D1   Hx of heart artery stent 11/20/2009   80% D1 stenosis - 2.5 x 18 mm Xience V Everolimus (DES) stent x 1 placed   Hyperlipidemia    Hypertension    Left below-knee amputee (HCC)    Legally blind    Myocardial infarction (HCC) 11/2009   Neuropathy    Osteoarthritis of spine    Osteomyelitis of left foot (HCC)    Peritoneal dialysis catheter in place [facility])    Does dialysis over night.   PONV (postoperative nausea and vomiting)    PVD (peripheral vascular disease) (HCC)    Seasonal allergies    Shoulder pain    Tubular adenoma of colon    Ulcer of foot (HCC) 11/17/2012   DIABETIC ULCERATIONS ASSOCIATED WITH IRRITATION LATERAL ANKLE LEFT GREATER THAN RIGHT WITH MILD CELLULITIS   Past Surgical History:  Past Surgical History:  Procedure Laterality Date   ABDOMINAL AORTOGRAM N/A 05/20/2016   Procedure: Abdominal Aortogram possible intervention;  Surgeon: Renford Dills, MD;  Location: [facility] INVASIVE CV LAB;  Service: Cardiovascular;  Laterality: N/A;   AMPUTATION Left 09/05/2017   Procedure: AMPUTATION BELOW KNEE;  Surgeon: Jones Broom, MD;  Location: [facility] OR;  Service: Orthopedics;  Laterality: Left;   ANTERIOR CERVICAL DECOMP/DISCECTOMY FUSION N/A 05/07/2017   Procedure: ANTERIOR CERVICAL DECOMPRESSION/DISCECTOMY Patrina Levering PROSTHESIS,PLATE/SCREWS CERVICAL FIVE - CERVICAL SIX;  Surgeon: Tressie Stalker, MD;  Location[facility];  Service: Neurosurgery;  Laterality: N/A;   CAPD INSERTION N/A  08/30/2020   Procedure: LAPAROSCOPIC INSERTION CONTINUOUS AMBULATORY PERITONEAL DIALYSIS  (CAPD) CATHETER;  Surgeon: Henrene Dodge, MD;  Location: [facility] ORS;  Service: General;  Laterality: N/A;   CATARACT EXTRACTION W/ INTRAOCULAR LENS IMPLANT     CATARACT EXTRACTION W/PHACO Left 02/26/2017   Procedure: CATARACT EXTRACTION PHACO AND INTRAOCULAR LENS PLACEMENT (IOC);  Surgeon: Nevada Crane, MD;  Location: [facility] ORS;  Service: Ophthalmology;  Laterality: Left;  Lot # [id number] H [place]: 00:25.3 AP%: 6.2 CDE: 1.59    CHOLECYSTECTOMY N/A    COLONOSCOPY WITH PROPOFOL N/A 05/30/2021   Procedure: COLONOSCOPY WITH PROPOFOL;  Surgeon: Beverley Fiedler, MD;  Location: [facility] ENDOSCOPY;  Service: Gastroenterology;  Laterality: N/A;   CORONARY ANGIOPLASTY WITH STENT PLACEMENT  11/20/2009   80% D1 stenosis - 2.5 x 18 mm Xience V Everolimus (DES) stent x 1  placed; LOCATION: [facility]; SURGEON: Harold Hedge, MD   CORONARY ARTERY BYPASS GRAFT N/A 01/27/2019   Procedure: OFF PUMP CORONARY ARTERY BYPASS GRAFTING (CABG) X 2 WITH ENDOSCOPIC HARVESTING OF RIGHT GREATER SAPHENOUS VEIN. LIMA TO LAD;  Surgeon: Corliss Skains, MD;  Location: [facility] OR;  Service: Open Heart Surgery;  Laterality: N/A;   CORONARY STENT INTERVENTION N/A 01/21/2019   Procedure: CORONARY STENT INTERVENTION;  Surgeon: Lyn Records, MD;  Location: [facility] INVASIVE CV LAB;  Service: Cardiovascular;  Laterality: N/A;   EPIBLEPHERON REPAIR WITH TEAR DUCT PROBING     EYE SURGERY Right 02/09/2014   cataract extraction   INSERTION EXPRESS TUBE SHUNT Right 09/06/2015   Procedure: INSERTION AHMED TUBE SHUNT with tutoplast allograft;  Surgeon: Nevada Crane, MD;  Location: [facility] ORS;  Service: Ophthalmology;  Laterality: Right;   laparoscopic insertion continuous peritoneal dialysis  2022   LEFT HEART CATH AND CORONARY ANGIOGRAPHY N/A 01/20/2019   Procedure: LEFT HEART CATH AND CORONARY ANGIOGRAPHY;  Surgeon: Runell Gess, MD;  Location: [facility] CV LAB;   Service: Cardiovascular;  Laterality: N/A;   POLYPECTOMY  05/30/2021   Procedure: POLYPECTOMY;  Surgeon: Beverley Fiedler, MD;  Location: [facility]OS[facility];  Service: Gastroenterology;;   TEE WITHOUT CARDIOVERSION N/A 01/27/2019   Procedure: TRANSESOPHAGEAL ECHOCARDIOGRAM (TEE);  Surgeon: Corliss Skains, MD;  Location[facility] OR;  Service: Open Heart Surgery;  Laterality: N/A;   VASECTOMY     Social History:  reports that he quit smoking about 15 years ago. His smoking use included cigarettes. He started smoking about 35 years ago. He has a 10 pack-year smoking history. He has never used smokeless tobacco. He reports that he does not currently use alcohol. He reports that he does not use drugs. Family History:  Family History  Problem Relation Age of Onset   Hyperlipidemia Mother    Diabetes Mother    Liver disease Mother        NASH   Other Mother        immune thrombocytopenic pupura (ITP)   Heart attack Father    Hyperlipidemia Father    Diabetes Father    Hypertension Father    Lymphoma Father    Hemochromatosis Other        Grandmother (? which side)   Polycythemia Other    Colon cancer Neg Hx    Esophageal cancer Neg Hx    Rectal cancer Neg Hx    Stomach cancer Neg Hx      HOME MEDICATIONS: Allergies as of 05/15/2023   No Known Allergies      Medication List        Accurate as of May 15, 2023 11:50 AM. If you have any questions, ask your nurse or doctor.          acetaminophen 325 MG tablet Commonly known as: TYLENOL Take 1 tablet (325 mg total) by mouth every 6 (six) hours as needed for mild pain (pain score 1-3 or temp > 100.5).   aspirin 81 MG chewable tablet Commonly known as: Aspirin Childrens Chew 1 tablet (81 mg total) by mouth daily.   atorvastatin 80 MG tablet Commonly known as: LIPITOR TAKE 1 TABLET BY MOUTH EVERY DAY What changed: when to take this   azelastine 0.1 % nasal spray Commonly known as: ASTELIN Place 2 sprays into both nostrils 2  (two) times daily. Use in each nostril as directed   brimonidine-timolol 0.2-0.5 % ophthalmic solution Commonly known as: COMBIGAN Place 1 drop into  both eyes every 12 (twelve) hours.   Dexcom G7 Sensor Misc USE 1 DEVICE AS DIRECTED   dorzolamide 2 % ophthalmic solution Commonly known as: TRUSOPT Place 1 drop into both eyes 2 (two) times daily.   escitalopram 10 MG tablet Commonly known as: LEXAPRO Take 10 mg by mouth daily.   esomeprazole 20 MG capsule Commonly known as: NEXIUM Take 20 mg by mouth daily.   ezetimibe 10 MG tablet Commonly known as: ZETIA TAKE 1 TABLET BY MOUTH EVERY DAY   Flomax 0.4 MG Caps capsule Generic drug: tamsulosin 0.4 mg at bedtime.   fluticasone 50 MCG/ACT nasal spray Commonly known as: FLONASE SPRAY 2 SPRAYS INTO EACH NOSTRIL EVERY DAY   gabapentin 300 MG capsule Commonly known as: NEURONTIN TAKE 1 CAPSULE BY MOUTH TWICE A DAY   gentamicin cream 0.1 % Commonly known as: GARAMYCIN Apply 1 application topically every other day. Around port   HumuLIN N KwikPen 100 UNIT/ML KwikPen Generic drug: Insulin NPH (Human) (Isophane) Inject 70 Units into the skin daily in the afternoon. To be taken at the start of dialysis   insulin lispro 100 UNIT/ML KwikPen Commonly known as: HumaLOG KwikPen Max daily 45 units What changed: additional instructions   Insulin Pen Needle 32G X 4 MM Misc Inject 1 Device into the skin 5 (five) times daily. Use to inject Lantus and Novolog up to 5 times daily   Insulin Syringe-Needle U-100 28G X 1/2" 1 ML Misc 1 Device by Does not apply route daily in the afternoon.   irbesartan 75 MG tablet Commonly known as: AVAPRO Take 75 mg by mouth at bedtime.   Klor-Con M20 20 MEQ tablet Generic drug: potassium chloride SA Take 20 mEq by mouth daily.   lactulose 10 GM/15ML solution Commonly known as: CHRONULAC TAKE 30 MLS (20 G TOTAL) BY MOUTH AS NEEDED.   latanoprost 0.005 % ophthalmic solution Commonly known as:  XALATAN Place 1 drop into both eyes 2 (two) times daily.   metoCLOPramide 10 MG tablet Commonly known as: REGLAN Take 1 tablet (10 mg total) by mouth at bedtime.   midodrine 5 MG tablet Commonly known as: PROAMATINE Take 5 mg by mouth 2 (two) times daily as needed (Below 100).   mometasone 0.1 % cream Commonly known as: ELOCON Apply 1 Application topically as directed. Qd to bid to aa right ankle, right lower leg until improved, then prn flares   MULTIVITAMIN ADULT PO Take 1 tablet by mouth daily.   mupirocin ointment 2 % Commonly known as: BACTROBAN Apply 1 Application topically daily. Every day to excision site on back   ondansetron 4 MG disintegrating tablet Commonly known as: ZOFRAN-ODT Take 4 mg by mouth every 8 (eight) hours as needed for nausea or vomiting.   PARoxetine 10 MG tablet Commonly known as: PAXIL Take 10 mg by mouth daily.   senna 8.6 MG tablet Commonly known as: SENOKOT Take 2 tablets by mouth daily.   sertraline 25 MG tablet Commonly known as: ZOLOFT Take 25 mg by mouth daily.   Testosterone 20.25 MG/ACT (1.62%) Gel Place 2 Pump onto the skin daily in the afternoon.   torsemide 20 MG tablet Commonly known as: DEMADEX Take 20-40 mg by mouth See admin instructions. Take 40 mg in the morning and 40 mg in the afternoon   Tresiba FlexTouch 200 UNIT/ML FlexTouch Pen Generic drug: insulin degludec Inject 42 Units into the skin in the morning and at bedtime.   Victoza 18 MG/3ML Sopn Generic drug: liraglutide  start at 0.6 mg daily for 4 weeks, then increase to 1.2 mg daily   Vitamin D 50 MCG (2000 UT) Caps Take 2,000 Units by mouth daily.         OBJECTIVE:   Vital Signs: BP 116/74 (BP Location: Right Arm, Patient Position: Sitting, Cuff Size: Normal)   Pulse 66   Ht 5\' 10"  (1.778 m)   Wt 255 lb (115.7 kg)   SpO2 94%   BMI 36.59 kg/m   Wt Readings from Last 3 Encounters:  05/15/23 255 lb (115.7 kg)  10/06/22 268 lb (121.6 kg)   09/24/22 266 lb (120.7 kg)     Exam: General: Pt appears well and is in NAD  Lungs: CTA  Heart: RRR  Neuro: MS is good with appropriate affect, pt is alert and Ox3        DATA REVIEWED:  Lab Results  Component Value Date   HGBA1C 6.8 (A) 09/24/2022   HGBA1C 6.9 (A) 12/06/2021   HGBA1C 6.6 (A) 11/15/2020       ASSESSMENT / PLAN / RECOMMENDATIONS:   1)Type 1 Diabetes Mellitus, optimally controlled, With ESRD on PD Hx of left BKA, retinopathy and macrovascular complications - Most recent A1c of 5.9 %. Goal A1c < 7.5 %.    -Per spouse he recently had an A1c which was 5.9% -BGs are optimal on CGM download, no changes   MEDICATIONS: - Tresiba 42 units twice a day  - Humalog 10-15 units with each meal  - NPH 70 units at the beginning of dialysis     EDUCATION / INSTRUCTIONS: BG monitoring instructions: Patient is instructed to check his blood sugars 4 times a day, before meals and bedtime  Call [facility] if: BG persistently < 70  I reviewed the Rule of 15 for the treatment of hypoglycemia in detail with the patient. Literature supplied.   2) Diabetic complications:  Eye: Does  have known diabetic retinopathy.  Neuro/ Feet: Does have known diabetic peripheral neuropathy .  Renal: Patient does  have known baseline CKD. He   is on peritoneal dialysis    3) Hypogonadism:   -I have attempted to prescribe testosterone twice already, he continues not to take it -We again discussed the importance of testosterone therapy for bone health and general wellbeing, especially with his recent wrist fracture. - Pituitary MRI was negative  -Another refill was provided   Medication  Restart testosterone gel 1 pump to each shoulder daily   F/U in 6 months    Signed electronically by: Lyndle Herrlich, MD  [facility] End[facility]  [facility] [address] [place], [place] [address] Phone: [phone] FAX:  [phone]   CC: Glori Luis, MD [address] [place] [address] Phone: [phone]  Fax: [phone]  Return to Endocrinology clinic as below: Future Appointments  Date Time Provider Department Center  05/20/2023  2:40 PM Glori Luis, MD [facility]  06/01/2023  3:15 PM Freddie Breech, DPM [facility]  10/13/2023  4:00 PM Willeen Niece, MD [facility] None

## 2023-05-15 NOTE — Patient Instructions (Addendum)
-   Tresiba  42  units twice a day  - Humalog 10-15 units with each meal  - NPH 70  units with the start of each dialysis   - Take Testosterone 1 pump to each shoulder daily  HOW TO TREAT LOW BLOOD SUGARS (Blood sugar LESS THAN 70 MG/DL) Please follow the RULE OF 15 for the treatment of hypoglycemia treatment (when your (blood sugars are less than 70 mg/dL)   STEP 1: Take 15 grams of carbohydrates when your blood sugar is low, which includes:  3-4 GLUCOSE TABS  OR 3-4 OZ OF JUICE OR REGULAR SODA OR ONE TUBE OF GLUCOSE GEL    STEP 2: RECHECK blood sugar in 15 MINUTES STEP 3: If your blood sugar is still low at the 15 minute recheck --> then, go back to STEP 1 and treat AGAIN with another 15 grams of carbohydrates.

## 2023-05-16 DIAGNOSIS — Z992 Dependence on renal dialysis: Secondary | ICD-10-CM | POA: Diagnosis not present

## 2023-05-16 DIAGNOSIS — N186 End stage renal disease: Secondary | ICD-10-CM | POA: Diagnosis not present

## 2023-05-17 DIAGNOSIS — Z992 Dependence on renal dialysis: Secondary | ICD-10-CM | POA: Diagnosis not present

## 2023-05-17 DIAGNOSIS — N186 End stage renal disease: Secondary | ICD-10-CM | POA: Diagnosis not present

## 2023-05-18 DIAGNOSIS — Z992 Dependence on renal dialysis: Secondary | ICD-10-CM | POA: Diagnosis not present

## 2023-05-18 DIAGNOSIS — N186 End stage renal disease: Secondary | ICD-10-CM | POA: Diagnosis not present

## 2023-05-19 ENCOUNTER — Encounter: Payer: Self-pay | Admitting: *Deleted

## 2023-05-19 DIAGNOSIS — N186 End stage renal disease: Secondary | ICD-10-CM | POA: Diagnosis not present

## 2023-05-19 DIAGNOSIS — Z992 Dependence on renal dialysis: Secondary | ICD-10-CM | POA: Diagnosis not present

## 2023-05-20 ENCOUNTER — Encounter: Payer: Medicare Other | Admitting: Family Medicine

## 2023-05-20 DIAGNOSIS — N186 End stage renal disease: Secondary | ICD-10-CM | POA: Diagnosis not present

## 2023-05-20 DIAGNOSIS — Z992 Dependence on renal dialysis: Secondary | ICD-10-CM | POA: Diagnosis not present

## 2023-05-21 DIAGNOSIS — Z992 Dependence on renal dialysis: Secondary | ICD-10-CM | POA: Diagnosis not present

## 2023-05-21 DIAGNOSIS — S52502D Unspecified fracture of the lower end of left radius, subsequent encounter for closed fracture with routine healing: Secondary | ICD-10-CM | POA: Diagnosis not present

## 2023-05-21 DIAGNOSIS — N186 End stage renal disease: Secondary | ICD-10-CM | POA: Diagnosis not present

## 2023-05-22 DIAGNOSIS — N186 End stage renal disease: Secondary | ICD-10-CM | POA: Diagnosis not present

## 2023-05-22 DIAGNOSIS — Z992 Dependence on renal dialysis: Secondary | ICD-10-CM | POA: Diagnosis not present

## 2023-05-23 DIAGNOSIS — Z992 Dependence on renal dialysis: Secondary | ICD-10-CM | POA: Diagnosis not present

## 2023-05-23 DIAGNOSIS — N186 End stage renal disease: Secondary | ICD-10-CM | POA: Diagnosis not present

## 2023-05-24 DIAGNOSIS — Z992 Dependence on renal dialysis: Secondary | ICD-10-CM | POA: Diagnosis not present

## 2023-05-24 DIAGNOSIS — N186 End stage renal disease: Secondary | ICD-10-CM | POA: Diagnosis not present

## 2023-05-25 DIAGNOSIS — N186 End stage renal disease: Secondary | ICD-10-CM | POA: Diagnosis not present

## 2023-05-25 DIAGNOSIS — Z992 Dependence on renal dialysis: Secondary | ICD-10-CM | POA: Diagnosis not present

## 2023-05-26 DIAGNOSIS — Z992 Dependence on renal dialysis: Secondary | ICD-10-CM | POA: Diagnosis not present

## 2023-05-26 DIAGNOSIS — N186 End stage renal disease: Secondary | ICD-10-CM | POA: Diagnosis not present

## 2023-05-27 DIAGNOSIS — Z992 Dependence on renal dialysis: Secondary | ICD-10-CM | POA: Diagnosis not present

## 2023-05-27 DIAGNOSIS — N186 End stage renal disease: Secondary | ICD-10-CM | POA: Diagnosis not present

## 2023-05-28 ENCOUNTER — Encounter (INDEPENDENT_AMBULATORY_CARE_PROVIDER_SITE_OTHER): Payer: Self-pay

## 2023-05-28 DIAGNOSIS — Z992 Dependence on renal dialysis: Secondary | ICD-10-CM | POA: Diagnosis not present

## 2023-05-28 DIAGNOSIS — N186 End stage renal disease: Secondary | ICD-10-CM | POA: Diagnosis not present

## 2023-05-29 ENCOUNTER — Ambulatory Visit (INDEPENDENT_AMBULATORY_CARE_PROVIDER_SITE_OTHER): Payer: Medicare Other | Admitting: Family Medicine

## 2023-05-29 DIAGNOSIS — N186 End stage renal disease: Secondary | ICD-10-CM | POA: Diagnosis not present

## 2023-05-29 DIAGNOSIS — Z0000 Encounter for general adult medical examination without abnormal findings: Secondary | ICD-10-CM

## 2023-05-29 DIAGNOSIS — Z992 Dependence on renal dialysis: Secondary | ICD-10-CM | POA: Diagnosis not present

## 2023-05-30 DIAGNOSIS — N186 End stage renal disease: Secondary | ICD-10-CM | POA: Diagnosis not present

## 2023-05-30 DIAGNOSIS — Z992 Dependence on renal dialysis: Secondary | ICD-10-CM | POA: Diagnosis not present

## 2023-06-01 ENCOUNTER — Ambulatory Visit: Payer: Self-pay | Admitting: Podiatry

## 2023-06-01 DIAGNOSIS — Z992 Dependence on renal dialysis: Secondary | ICD-10-CM | POA: Diagnosis not present

## 2023-06-01 DIAGNOSIS — N186 End stage renal disease: Secondary | ICD-10-CM | POA: Diagnosis not present

## 2023-06-01 NOTE — Progress Notes (Signed)
 Patient cancelled visit

## 2023-06-02 DIAGNOSIS — N186 End stage renal disease: Secondary | ICD-10-CM | POA: Diagnosis not present

## 2023-06-02 DIAGNOSIS — Z992 Dependence on renal dialysis: Secondary | ICD-10-CM | POA: Diagnosis not present

## 2023-06-03 DIAGNOSIS — N186 End stage renal disease: Secondary | ICD-10-CM | POA: Diagnosis not present

## 2023-06-03 DIAGNOSIS — Z992 Dependence on renal dialysis: Secondary | ICD-10-CM | POA: Diagnosis not present

## 2023-06-04 DIAGNOSIS — Z992 Dependence on renal dialysis: Secondary | ICD-10-CM | POA: Diagnosis not present

## 2023-06-04 DIAGNOSIS — N186 End stage renal disease: Secondary | ICD-10-CM | POA: Diagnosis not present

## 2023-06-06 DIAGNOSIS — Z992 Dependence on renal dialysis: Secondary | ICD-10-CM | POA: Diagnosis not present

## 2023-06-06 DIAGNOSIS — N186 End stage renal disease: Secondary | ICD-10-CM | POA: Diagnosis not present

## 2023-06-07 DIAGNOSIS — Z992 Dependence on renal dialysis: Secondary | ICD-10-CM | POA: Diagnosis not present

## 2023-06-07 DIAGNOSIS — N186 End stage renal disease: Secondary | ICD-10-CM | POA: Diagnosis not present

## 2023-06-08 ENCOUNTER — Encounter: Payer: Medicare Other | Admitting: Family Medicine

## 2023-06-08 DIAGNOSIS — N186 End stage renal disease: Secondary | ICD-10-CM | POA: Diagnosis not present

## 2023-06-08 DIAGNOSIS — E1169 Type 2 diabetes mellitus with other specified complication: Secondary | ICD-10-CM

## 2023-06-08 DIAGNOSIS — Z992 Dependence on renal dialysis: Secondary | ICD-10-CM | POA: Diagnosis not present

## 2023-06-08 DIAGNOSIS — E785 Hyperlipidemia, unspecified: Secondary | ICD-10-CM

## 2023-06-09 DIAGNOSIS — Z992 Dependence on renal dialysis: Secondary | ICD-10-CM | POA: Diagnosis not present

## 2023-06-09 DIAGNOSIS — N186 End stage renal disease: Secondary | ICD-10-CM | POA: Diagnosis not present

## 2023-06-10 ENCOUNTER — Other Ambulatory Visit: Payer: Self-pay | Admitting: Family Medicine

## 2023-06-10 DIAGNOSIS — Z992 Dependence on renal dialysis: Secondary | ICD-10-CM | POA: Diagnosis not present

## 2023-06-10 DIAGNOSIS — N186 End stage renal disease: Secondary | ICD-10-CM | POA: Diagnosis not present

## 2023-06-11 DIAGNOSIS — Z992 Dependence on renal dialysis: Secondary | ICD-10-CM | POA: Diagnosis not present

## 2023-06-11 DIAGNOSIS — N186 End stage renal disease: Secondary | ICD-10-CM | POA: Diagnosis not present

## 2023-06-12 DIAGNOSIS — Z992 Dependence on renal dialysis: Secondary | ICD-10-CM | POA: Diagnosis not present

## 2023-06-12 DIAGNOSIS — N186 End stage renal disease: Secondary | ICD-10-CM | POA: Diagnosis not present

## 2023-06-13 DIAGNOSIS — N186 End stage renal disease: Secondary | ICD-10-CM | POA: Diagnosis not present

## 2023-06-13 DIAGNOSIS — Z992 Dependence on renal dialysis: Secondary | ICD-10-CM | POA: Diagnosis not present

## 2023-06-14 DIAGNOSIS — Z992 Dependence on renal dialysis: Secondary | ICD-10-CM | POA: Diagnosis not present

## 2023-06-14 DIAGNOSIS — N186 End stage renal disease: Secondary | ICD-10-CM | POA: Diagnosis not present

## 2023-06-16 DIAGNOSIS — Z992 Dependence on renal dialysis: Secondary | ICD-10-CM | POA: Diagnosis not present

## 2023-06-16 DIAGNOSIS — N186 End stage renal disease: Secondary | ICD-10-CM | POA: Diagnosis not present

## 2023-06-17 DIAGNOSIS — N186 End stage renal disease: Secondary | ICD-10-CM | POA: Diagnosis not present

## 2023-06-17 DIAGNOSIS — Z992 Dependence on renal dialysis: Secondary | ICD-10-CM | POA: Diagnosis not present

## 2023-06-18 DIAGNOSIS — Z992 Dependence on renal dialysis: Secondary | ICD-10-CM | POA: Diagnosis not present

## 2023-06-18 DIAGNOSIS — N186 End stage renal disease: Secondary | ICD-10-CM | POA: Diagnosis not present

## 2023-06-19 DIAGNOSIS — N186 End stage renal disease: Secondary | ICD-10-CM | POA: Diagnosis not present

## 2023-06-19 DIAGNOSIS — Z992 Dependence on renal dialysis: Secondary | ICD-10-CM | POA: Diagnosis not present

## 2023-06-20 DIAGNOSIS — N186 End stage renal disease: Secondary | ICD-10-CM | POA: Diagnosis not present

## 2023-06-20 DIAGNOSIS — Z992 Dependence on renal dialysis: Secondary | ICD-10-CM | POA: Diagnosis not present

## 2023-06-21 DIAGNOSIS — N186 End stage renal disease: Secondary | ICD-10-CM | POA: Diagnosis not present

## 2023-06-21 DIAGNOSIS — Z992 Dependence on renal dialysis: Secondary | ICD-10-CM | POA: Diagnosis not present

## 2023-06-22 DIAGNOSIS — Z992 Dependence on renal dialysis: Secondary | ICD-10-CM | POA: Diagnosis not present

## 2023-06-22 DIAGNOSIS — N186 End stage renal disease: Secondary | ICD-10-CM | POA: Diagnosis not present

## 2023-06-23 DIAGNOSIS — N186 End stage renal disease: Secondary | ICD-10-CM | POA: Diagnosis not present

## 2023-06-23 DIAGNOSIS — Z992 Dependence on renal dialysis: Secondary | ICD-10-CM | POA: Diagnosis not present

## 2023-06-24 DIAGNOSIS — Z992 Dependence on renal dialysis: Secondary | ICD-10-CM | POA: Diagnosis not present

## 2023-06-24 DIAGNOSIS — N186 End stage renal disease: Secondary | ICD-10-CM | POA: Diagnosis not present

## 2023-06-25 DIAGNOSIS — N186 End stage renal disease: Secondary | ICD-10-CM | POA: Diagnosis not present

## 2023-06-25 DIAGNOSIS — Z992 Dependence on renal dialysis: Secondary | ICD-10-CM | POA: Diagnosis not present

## 2023-06-25 NOTE — Progress Notes (Deleted)
 Cardiology Clinic Note   Date: 06/25/2023 ID: TAM DELISLE, DOB 1966-03-25, MRN [id number]  Primary Cardiologist:  Yvonne Kendall, MD  Chief Complaint   Chase Clark is a 58 y.o. male who presents to the clinic today for ***  Patient Profile   MATHIAS BOGACKI is followed by Dr. Okey Dupre for the history outlined below.      Past medical history significant for: CAD. PCI with DES to D1 11/20/2009. LHC 01/20/2019 (unstable angina): Acute marginal 60%.  D1 stent widely patent.  Mid LAD 90%. Staged PCI 01/21/2019: Failed PCI of mid LAD due to failure to cross with wire because of tortuousity and calcification. CABG x 2 01/27/2019: LIMA to LAD, SVG to D1. PAD. Left BKA 2019. Hypertension. Hyperlipidemia. Lipid panel 09/16/2022: Direct LDL 47, HDL 27, TG 365, total 124. Syncope. Echo 08/11/2020: EF 65 to 70%.  No RWMA.  Moderate LVH.  Normal diastolic parameters.  Normal RV size/function.  Trivial MR. T1DM. Gilbert's disease. ESRD. Peritoneal dialysis***  In summary, patient has a history of CAD s/p PCI with DES to D1 August 2011.  In October 2020 he underwent LHC for unstable angina and was found to have 90% mid LAD stenosis as detailed above.  Secondary to kidney function he underwent staged PCI the following day which was unable to be performed secondary to failure to cross lesion with wire as detailed above.  He underwent CABG x 2 on 01/27/2019.  Patient also has history of PAD with left BKA in 2019.  Echo performed April 2022 in the setting of syncope demonstrated EF 65 to 70% as detailed above.  He was previously followed by Dr. Lady Gary at [facility] until May 2023.  Patient establish care with Dr. Okey Dupre on 10/06/2022.  He was doing well from a cardiac standpoint.  He was undergoing evaluation for kidney transplant at [facility] and was noted to have poor exercise tolerance during a cardiopulmonary exercise test.  Patient attributed this to right hip pain and felt he could have walked farther if  necessary.  Patient reported dyspnea walking 80 to 100 feet but stated right hip pain is biggest limiting factor to his mobility.  He reported palpitations sometimes waking him at night typically lasting a few seconds and resolving on their own.  He reported rare episodes of lightheadedness most commonly with systolic blood pressure <100 mmHg.  He had been on peritoneal dialysis for the last 1.5 years.     History of Present Illness    Today, patient ***  CAD S/p PCI with DES to D1 August 2011.  CABG x 14 January 2019.  Patient*** -Continue aspirin, atorvastatin, Zetia.  PAD S/p left BKA 2019.  Patient*** -Continue aspirin, atorvastatin, Zetia.  Hypertension BP today*** -Continue irbesartan.  Hyperlipidemia/hypertriglyceridemia LDL June 2024 47, at goal.  TG 365. -Continue atorvastatin, Zetia. -Continue to follow with PCP.  ROS: All other systems reviewed and are otherwise negative except as noted in History of Present Illness.  EKGs/Labs Reviewed        09/16/2022: ALT 27; AST 34   No results found for requested labs within last 365 days.   No results found for requested labs within last 365 days.   No results found for requested labs within last 365 days.  ***  Risk Assessment/Calculations    {Does this patient have ATRIAL FIBRILLATION?[phone]} No BP recorded.  {Refresh Note OR Click here to enter BP  :1}***        Physical Exam  VS:  There were no vitals taken for this visit. , BMI There is no height or weight on file to calculate BMI.  GEN: Well nourished, well developed, in no acute distress. Neck: No JVD or carotid bruits. Cardiac: *** RRR. No murmurs. No rubs or gallops.   Respiratory:  Respirations regular and unlabored. Clear to auscultation without rales, wheezing or rhonchi. GI: Soft, nontender, nondistended. Extremities: Radials/DP/PT 2+ and equal bilaterally. No clubbing or cyanosis. No edema ***  Skin: Warm and dry, no rash. Neuro: Strength  intact.  Assessment & Plan   ***  Disposition: ***     {Are you ordering a CV Procedure (e.g. stress test, cath, DCCV, TEE, etc)?   Press F2        :[id number]}   Signed, Etta Grandchild. Sindia Kowalczyk, DNP, NP-C

## 2023-06-26 ENCOUNTER — Ambulatory Visit: Admitting: Student

## 2023-06-26 DIAGNOSIS — N186 End stage renal disease: Secondary | ICD-10-CM | POA: Diagnosis not present

## 2023-06-26 DIAGNOSIS — Z992 Dependence on renal dialysis: Secondary | ICD-10-CM | POA: Diagnosis not present

## 2023-06-27 DIAGNOSIS — N186 End stage renal disease: Secondary | ICD-10-CM | POA: Diagnosis not present

## 2023-06-27 DIAGNOSIS — Z992 Dependence on renal dialysis: Secondary | ICD-10-CM | POA: Diagnosis not present

## 2023-06-28 DIAGNOSIS — Z992 Dependence on renal dialysis: Secondary | ICD-10-CM | POA: Diagnosis not present

## 2023-06-28 DIAGNOSIS — N186 End stage renal disease: Secondary | ICD-10-CM | POA: Diagnosis not present

## 2023-06-29 DIAGNOSIS — N186 End stage renal disease: Secondary | ICD-10-CM | POA: Diagnosis not present

## 2023-06-29 DIAGNOSIS — Z992 Dependence on renal dialysis: Secondary | ICD-10-CM | POA: Diagnosis not present

## 2023-06-30 DIAGNOSIS — Z992 Dependence on renal dialysis: Secondary | ICD-10-CM | POA: Diagnosis not present

## 2023-06-30 DIAGNOSIS — N186 End stage renal disease: Secondary | ICD-10-CM | POA: Diagnosis not present

## 2023-07-01 DIAGNOSIS — N186 End stage renal disease: Secondary | ICD-10-CM | POA: Diagnosis not present

## 2023-07-01 DIAGNOSIS — Z992 Dependence on renal dialysis: Secondary | ICD-10-CM | POA: Diagnosis not present

## 2023-07-02 DIAGNOSIS — N186 End stage renal disease: Secondary | ICD-10-CM | POA: Diagnosis not present

## 2023-07-02 DIAGNOSIS — Z992 Dependence on renal dialysis: Secondary | ICD-10-CM | POA: Diagnosis not present

## 2023-07-03 DIAGNOSIS — N186 End stage renal disease: Secondary | ICD-10-CM | POA: Diagnosis not present

## 2023-07-03 DIAGNOSIS — Z992 Dependence on renal dialysis: Secondary | ICD-10-CM | POA: Diagnosis not present

## 2023-07-04 DIAGNOSIS — N186 End stage renal disease: Secondary | ICD-10-CM | POA: Diagnosis not present

## 2023-07-04 DIAGNOSIS — Z992 Dependence on renal dialysis: Secondary | ICD-10-CM | POA: Diagnosis not present

## 2023-07-05 DIAGNOSIS — N186 End stage renal disease: Secondary | ICD-10-CM | POA: Diagnosis not present

## 2023-07-05 DIAGNOSIS — Z992 Dependence on renal dialysis: Secondary | ICD-10-CM | POA: Diagnosis not present

## 2023-07-06 ENCOUNTER — Other Ambulatory Visit: Payer: Self-pay

## 2023-07-06 DIAGNOSIS — N186 End stage renal disease: Secondary | ICD-10-CM | POA: Diagnosis not present

## 2023-07-06 DIAGNOSIS — Z992 Dependence on renal dialysis: Secondary | ICD-10-CM | POA: Diagnosis not present

## 2023-07-06 MED ORDER — EZETIMIBE 10 MG PO TABS: 10.0000 mg | ORAL_TABLET | Freq: Every day | ORAL | 0 refills | Status: DC

## 2023-07-07 DIAGNOSIS — Z992 Dependence on renal dialysis: Secondary | ICD-10-CM | POA: Diagnosis not present

## 2023-07-07 DIAGNOSIS — N186 End stage renal disease: Secondary | ICD-10-CM | POA: Diagnosis not present

## 2023-07-08 DIAGNOSIS — N186 End stage renal disease: Secondary | ICD-10-CM | POA: Diagnosis not present

## 2023-07-08 DIAGNOSIS — Z992 Dependence on renal dialysis: Secondary | ICD-10-CM | POA: Diagnosis not present

## 2023-07-09 DIAGNOSIS — N186 End stage renal disease: Secondary | ICD-10-CM | POA: Diagnosis not present

## 2023-07-09 DIAGNOSIS — Z992 Dependence on renal dialysis: Secondary | ICD-10-CM | POA: Diagnosis not present

## 2023-07-10 DIAGNOSIS — N186 End stage renal disease: Secondary | ICD-10-CM | POA: Diagnosis not present

## 2023-07-10 DIAGNOSIS — Z992 Dependence on renal dialysis: Secondary | ICD-10-CM | POA: Diagnosis not present

## 2023-07-11 DIAGNOSIS — N186 End stage renal disease: Secondary | ICD-10-CM | POA: Diagnosis not present

## 2023-07-11 DIAGNOSIS — Z992 Dependence on renal dialysis: Secondary | ICD-10-CM | POA: Diagnosis not present

## 2023-07-12 DIAGNOSIS — Z992 Dependence on renal dialysis: Secondary | ICD-10-CM | POA: Diagnosis not present

## 2023-07-12 DIAGNOSIS — N186 End stage renal disease: Secondary | ICD-10-CM | POA: Diagnosis not present

## 2023-07-13 DIAGNOSIS — N186 End stage renal disease: Secondary | ICD-10-CM | POA: Diagnosis not present

## 2023-07-13 DIAGNOSIS — Z992 Dependence on renal dialysis: Secondary | ICD-10-CM | POA: Diagnosis not present

## 2023-07-14 DIAGNOSIS — N186 End stage renal disease: Secondary | ICD-10-CM | POA: Diagnosis not present

## 2023-07-14 DIAGNOSIS — Z992 Dependence on renal dialysis: Secondary | ICD-10-CM | POA: Diagnosis not present

## 2023-07-15 DIAGNOSIS — Z992 Dependence on renal dialysis: Secondary | ICD-10-CM | POA: Diagnosis not present

## 2023-07-15 DIAGNOSIS — H4052X3 Glaucoma secondary to other eye disorders, left eye, severe stage: Secondary | ICD-10-CM | POA: Diagnosis not present

## 2023-07-15 DIAGNOSIS — N186 End stage renal disease: Secondary | ICD-10-CM | POA: Diagnosis not present

## 2023-07-15 DIAGNOSIS — E113513 Type 2 diabetes mellitus with proliferative diabetic retinopathy with macular edema, bilateral: Secondary | ICD-10-CM | POA: Diagnosis not present

## 2023-07-16 DIAGNOSIS — Z992 Dependence on renal dialysis: Secondary | ICD-10-CM | POA: Diagnosis not present

## 2023-07-16 DIAGNOSIS — N186 End stage renal disease: Secondary | ICD-10-CM | POA: Diagnosis not present

## 2023-07-17 DIAGNOSIS — Z992 Dependence on renal dialysis: Secondary | ICD-10-CM | POA: Diagnosis not present

## 2023-07-17 DIAGNOSIS — N186 End stage renal disease: Secondary | ICD-10-CM | POA: Diagnosis not present

## 2023-07-18 DIAGNOSIS — Z992 Dependence on renal dialysis: Secondary | ICD-10-CM | POA: Diagnosis not present

## 2023-07-18 DIAGNOSIS — N186 End stage renal disease: Secondary | ICD-10-CM | POA: Diagnosis not present

## 2023-07-19 DIAGNOSIS — Z992 Dependence on renal dialysis: Secondary | ICD-10-CM | POA: Diagnosis not present

## 2023-07-19 DIAGNOSIS — N186 End stage renal disease: Secondary | ICD-10-CM | POA: Diagnosis not present

## 2023-07-20 ENCOUNTER — Encounter

## 2023-07-20 DIAGNOSIS — E1169 Type 2 diabetes mellitus with other specified complication: Secondary | ICD-10-CM

## 2023-07-20 DIAGNOSIS — N186 End stage renal disease: Secondary | ICD-10-CM | POA: Diagnosis not present

## 2023-07-20 DIAGNOSIS — Z992 Dependence on renal dialysis: Secondary | ICD-10-CM | POA: Diagnosis not present

## 2023-07-20 DIAGNOSIS — J309 Allergic rhinitis, unspecified: Secondary | ICD-10-CM

## 2023-07-20 NOTE — Progress Notes (Unsigned)
 Established Patient Office Visit   Subjective  Patient ID: Chase Clark, male    DOB: [birth year]  Age: 58 y.o. MRN: [id number]  No chief complaint on file.   He  has a past medical history of Anemia, Arthritis, Asthma, Atypical mole (09/22/2022), Barrett's esophagus (02/16/2014), Cataract, Chronic kidney disease (CKD), stage IV (severe) (HCC), Coronary artery disease, DDD (degenerative disc disease), cervical, Diabetes mellitus without complication (HCC), Diabetic osteomyelitis (HCC), Diabetic peripheral neuropathy (HCC), Diabetic ulcer of foot with muscle involvement without evidence of necrosis (HCC), Diverticulosis, DKA (diabetic ketoacidoses) (08/23/2017), Edema, lower extremity, Fatty liver, Gastroparesis (2015), GERD (gastroesophageal reflux disease), Gilbert's disease, Glaucoma, BKA, left (HCC), CABG (01/27/2019), heart artery stent (11/20/2009), Hyperlipidemia, Hypertension, Left below-knee amputee (HCC), Legally blind, Myocardial infarction (HCC) (11/2009), Neuropathy, Osteoarthritis of spine, Osteomyelitis of left foot (HCC), Peritoneal dialysis catheter in place [facility]), PONV (postoperative nausea and vomiting), PVD (peripheral vascular disease) (HCC), Seasonal allergies, Shoulder pain, Tubular adenoma of colon, and Ulcer of foot (HCC) (11/17/2012).  HPI Established patient of Dr. Birdie Sons presenting for transition of care. Last office visit with him was on 09/16/22.   Preventative:  - Colon polyps: Per Dr. Donnamarie Rossetti note from 06/07/20, patient was supposed to have colonoscopy in 6 months.  - Hep C screening - Diabetic eye exam -   1) Essential hypertension: On irbesartan 75 mg daily. Home BP **. CMP,   2) ESRD on Peritoneal Dialysis: Established patient with [facility] sees Dr. Mosetta Pigeon. He continues to have peritoneal dialysis at night. [employer] during the day 2.5 L At night uses two bags of 6 Liters 2.5 % dextrose ~ 300 grams of CHO   On renal transplant list,  following up with [facility] health [facility]? Dialysis days***   3) Type I DM: Last HbA1c from 09/24/22 6.8%. On short and long acting insulin. Medication management per endocrinologist Dr. Lonzo Cloud. Last OV was on 05/15/23. Is due for HbA1c check.   4) Mixed hyperlipidemia: 09/16/22 showed moderate elevation in TG of 365, LDL was within goal of 47 mg/dl. Patient is on Atorvastatin 80 mg and Ezetimibe. Diet, alcohol, exercise,   5) H/O Hypogonadism, gynecomastia, erectile dysfunction:  Management per endo (Dr. Lonzo Cloud), on Testosterone gel since December 2023.  Brief history: He was noted with low testosterone levels (239.76 NG/DL in July 1478) after presenting to his PCP with gynecomastia, and slight elevation of the prolactin at 32.6 NG/mL with inappropriately normal LH at 7.8mIU/mL .  Mammogram 10/16/2021 was unrevealing except for bilateral gynecomastia and no malignancy. Repeat testosterone levels through [facility] were low at 176 NG/DL 05/9560.  Pituitary MRI did not reveal any adenoma.   6) Fracture of distal radius: Sustained from mechanical fall. Seeing [facility].   7) GI for GERD and gastroparesis as well as family history of Elita Boone cirrhosis   8) Anemia of chronic disease: Hb from 12/06/21 showed 11.9.   9) Why Gabapentin: Currently is listed to take Gabapentin 300 mg, BID, last refill was sent on 02/10/23 (60 cap with 1 refill).   10) Is listed to take Escitalopram/Lexapro 10 mg, Paroxetine/Paxil 10 mg, Sertraline/Zoloft 50 mg. Who manages this?   11) ?Smoking?   ROS As per HPI    Objective:     There were no vitals taken for this visit. {Vitals History (Optional):23777}    09/16/2022    9:12 AM 02/28/2022   10:42 AM 08/26/2021    4:09 PM  PHQ9 SCORE ONLY  PHQ-9 Total Score 3 0 0  09/16/2022    9:13 AM  GAD 7 : Generalized Anxiety Score  Nervous, Anxious, on Edge 0  Control/stop worrying 0  Worry too much - different things 0  Trouble relaxing 0  Restless 0   Easily annoyed or irritable 0  Afraid - awful might happen 0  Total GAD 7 Score 0  Anxiety Difficulty Not difficult at all     Physical Exam   No results found for any visits on 07/20/23.  The ASCVD Risk score (Arnett DK, et al., 2019) failed to calculate for the following reasons:   Risk score cannot be calculated because patient has a medical history suggesting prior/existing ASCVD    Assessment & Plan:  Hyperlipidemia associated with type 2 diabetes mellitus (HCC)  Allergic rhinitis, unspecified seasonality, unspecified trigger    No follow-ups on file.   Skip Estimable, MD

## 2023-07-21 DIAGNOSIS — Z992 Dependence on renal dialysis: Secondary | ICD-10-CM | POA: Diagnosis not present

## 2023-07-21 DIAGNOSIS — N186 End stage renal disease: Secondary | ICD-10-CM | POA: Diagnosis not present

## 2023-07-22 DIAGNOSIS — Z992 Dependence on renal dialysis: Secondary | ICD-10-CM | POA: Diagnosis not present

## 2023-07-22 DIAGNOSIS — N186 End stage renal disease: Secondary | ICD-10-CM | POA: Diagnosis not present

## 2023-07-23 DIAGNOSIS — N186 End stage renal disease: Secondary | ICD-10-CM | POA: Diagnosis not present

## 2023-07-23 DIAGNOSIS — Z992 Dependence on renal dialysis: Secondary | ICD-10-CM | POA: Diagnosis not present

## 2023-07-24 DIAGNOSIS — S161XXA Strain of muscle, fascia and tendon at neck level, initial encounter: Secondary | ICD-10-CM | POA: Diagnosis not present

## 2023-07-24 DIAGNOSIS — N186 End stage renal disease: Secondary | ICD-10-CM | POA: Diagnosis not present

## 2023-07-24 DIAGNOSIS — Z992 Dependence on renal dialysis: Secondary | ICD-10-CM | POA: Diagnosis not present

## 2023-07-24 NOTE — Progress Notes (Deleted)
 Cardiology Clinic Note   Date: 07/24/2023 ID: LUIGI STUCKEY, DOB 01-Apr-1966, MRN [id number]  Primary Cardiologist:  Yvonne Kendall, MD  Chief Complaint   RONALD LONDO is a 58 y.o. male who presents to the clinic today for ***  Patient Profile   TONIE VIZCARRONDO is followed by Dr. Okey Dupre for the history outlined below.      Past medical history significant for: CAD. PCI with DES to D1 11/20/2009. LHC 01/20/2019 (unstable angina): Acute marginal 60%.  D1 stent widely patent.  Mid LAD 90%. Staged PCI 01/21/2019: Failed PCI of mid LAD due to failure to cross with wire because of tortuousity and calcification. CABG x 2 01/27/2019: LIMA to LAD, SVG to D1. PAD. Left BKA 2019. Hypertension. Hyperlipidemia. Lipid panel 09/16/2022: Direct LDL 47, HDL 27, TG 365, total 124. Syncope. Echo 08/11/2020: EF 65 to 70%.  No RWMA.  Moderate LVH.  Normal diastolic parameters.  Normal RV size/function.  Trivial MR. T1DM. Gilbert's disease. ESRD. Peritoneal dialysis***  In summary, patient has a history of CAD s/p PCI with DES to D1 August 2011.  In October 2020 he underwent LHC for unstable angina and was found to have 90% mid LAD stenosis as detailed above.  Secondary to kidney function he underwent staged PCI the following day which was unable to be performed secondary to failure to cross lesion with wire as detailed above.  He underwent CABG x 2 on 01/27/2019.  Patient also has history of PAD with left BKA in 2019.  Echo performed April 2022 in the setting of syncope demonstrated EF 65 to 70% as detailed above.  He was previously followed by Dr. Lady Gary at [facility] until May 2023.  Patient establish care with Dr. Okey Dupre on 10/06/2022.  He was doing well from a cardiac standpoint.  He was undergoing evaluation for kidney transplant at [facility] and was noted to have poor exercise tolerance during a cardiopulmonary exercise test.  Patient attributed this to right hip pain and felt he could have walked farther if  necessary.  Patient reported dyspnea walking 80 to 100 feet but stated right hip pain is biggest limiting factor to his mobility.  He reported palpitations sometimes waking him at night typically lasting a few seconds and resolving on their own.  He reported rare episodes of lightheadedness most commonly with systolic blood pressure <100 mmHg.  He had been on peritoneal dialysis for the last 1.5 years.     History of Present Illness    Today, patient ***  CAD S/p PCI with DES to D1 August 2011.  CABG x 14 January 2019.  Patient*** -Continue aspirin, atorvastatin, Zetia.  PAD S/p left BKA 2019.  Patient*** -Continue aspirin, atorvastatin, Zetia.  Hypertension BP today*** -Continue irbesartan.  Hyperlipidemia/hypertriglyceridemia LDL June 2024 47, at goal.  TG 365. -Continue atorvastatin, Zetia. -Continue to follow with PCP.  ROS: All other systems reviewed and are otherwise negative except as noted in History of Present Illness.  EKGs/Labs Reviewed        09/16/2022: ALT 27; AST 34   No results found for requested labs within last 365 days.   No results found for requested labs within last 365 days.   No results found for requested labs within last 365 days.  ***  Risk Assessment/Calculations    {Does this patient have ATRIAL FIBRILLATION?:[id number]} No BP recorded.  {Refresh Note OR Click here to enter BP  :1}***        Physical Exam  VS:  There were no vitals taken for this visit. , BMI There is no height or weight on file to calculate BMI.  GEN: Well nourished, well developed, in no acute distress. Neck: No JVD or carotid bruits. Cardiac: *** RRR. No murmurs. No rubs or gallops.   Respiratory:  Respirations regular and unlabored. Clear to auscultation without rales, wheezing or rhonchi. GI: Soft, nontender, nondistended. Extremities: Radials/DP/PT 2+ and equal bilaterally. No clubbing or cyanosis. No edema ***  Skin: Warm and dry, no rash. Neuro: Strength  intact.  Assessment & Plan   ***  Disposition: ***     {Are you ordering a CV Procedure (e.g. stress test, cath, DCCV, TEE, etc)?   Press F2        :[id number]}   Signed, Etta Grandchild. Irlanda Croghan, DNP, NP-C

## 2023-07-25 DIAGNOSIS — N186 End stage renal disease: Secondary | ICD-10-CM | POA: Diagnosis not present

## 2023-07-25 DIAGNOSIS — Z992 Dependence on renal dialysis: Secondary | ICD-10-CM | POA: Diagnosis not present

## 2023-07-26 ENCOUNTER — Emergency Department (HOSPITAL_COMMUNITY)

## 2023-07-26 ENCOUNTER — Encounter (HOSPITAL_COMMUNITY): Payer: Self-pay

## 2023-07-26 ENCOUNTER — Other Ambulatory Visit: Payer: Self-pay

## 2023-07-26 ENCOUNTER — Inpatient Hospital Stay (HOSPITAL_COMMUNITY)
Admission: EM | Admit: 2023-07-26 | Discharge: 2023-07-30 | DRG: 091 | Disposition: A | Discharge status: Home / Self Care | Attending: Internal Medicine | Admitting: Internal Medicine

## 2023-07-26 DIAGNOSIS — R441 Visual hallucinations: Secondary | ICD-10-CM | POA: Diagnosis present

## 2023-07-26 DIAGNOSIS — Z8249 Family history of ischemic heart disease and other diseases of the circulatory system: Secondary | ICD-10-CM

## 2023-07-26 DIAGNOSIS — I6381 Other cerebral infarction due to occlusion or stenosis of small artery: Secondary | ICD-10-CM | POA: Diagnosis not present

## 2023-07-26 DIAGNOSIS — J329 Chronic sinusitis, unspecified: Secondary | ICD-10-CM | POA: Diagnosis not present

## 2023-07-26 DIAGNOSIS — G9341 Metabolic encephalopathy: Secondary | ICD-10-CM | POA: Diagnosis present

## 2023-07-26 DIAGNOSIS — E860 Dehydration: Secondary | ICD-10-CM | POA: Diagnosis not present

## 2023-07-26 DIAGNOSIS — J45909 Unspecified asthma, uncomplicated: Secondary | ICD-10-CM | POA: Diagnosis present

## 2023-07-26 DIAGNOSIS — E785 Hyperlipidemia, unspecified: Secondary | ICD-10-CM | POA: Diagnosis present

## 2023-07-26 DIAGNOSIS — E1169 Type 2 diabetes mellitus with other specified complication: Secondary | ICD-10-CM | POA: Diagnosis present

## 2023-07-26 DIAGNOSIS — E66812 Obesity, class 2: Secondary | ICD-10-CM | POA: Diagnosis present

## 2023-07-26 DIAGNOSIS — Z6836 Body mass index (BMI) 36.0-36.9, adult: Secondary | ICD-10-CM | POA: Diagnosis not present

## 2023-07-26 DIAGNOSIS — E1051 Type 1 diabetes mellitus with diabetic peripheral angiopathy without gangrene: Secondary | ICD-10-CM | POA: Diagnosis present

## 2023-07-26 DIAGNOSIS — E1022 Type 1 diabetes mellitus with diabetic chronic kidney disease: Secondary | ICD-10-CM | POA: Diagnosis present

## 2023-07-26 DIAGNOSIS — E1042 Type 1 diabetes mellitus with diabetic polyneuropathy: Secondary | ICD-10-CM | POA: Diagnosis not present

## 2023-07-26 DIAGNOSIS — E16A1 Hypoglycemia level 1: Secondary | ICD-10-CM | POA: Diagnosis not present

## 2023-07-26 DIAGNOSIS — N2581 Secondary hyperparathyroidism of renal origin: Secondary | ICD-10-CM | POA: Diagnosis present

## 2023-07-26 DIAGNOSIS — Z794 Long term (current) use of insulin: Secondary | ICD-10-CM

## 2023-07-26 DIAGNOSIS — E1043 Type 1 diabetes mellitus with diabetic autonomic (poly)neuropathy: Secondary | ICD-10-CM | POA: Diagnosis present

## 2023-07-26 DIAGNOSIS — K760 Fatty (change of) liver, not elsewhere classified: Secondary | ICD-10-CM | POA: Diagnosis not present

## 2023-07-26 DIAGNOSIS — E669 Obesity, unspecified: Secondary | ICD-10-CM | POA: Diagnosis present

## 2023-07-26 DIAGNOSIS — E1065 Type 1 diabetes mellitus with hyperglycemia: Secondary | ICD-10-CM | POA: Diagnosis not present

## 2023-07-26 DIAGNOSIS — Z955 Presence of coronary angioplasty implant and graft: Secondary | ICD-10-CM

## 2023-07-26 DIAGNOSIS — G928 Other toxic encephalopathy: Secondary | ICD-10-CM | POA: Diagnosis not present

## 2023-07-26 DIAGNOSIS — E10649 Type 1 diabetes mellitus with hypoglycemia without coma: Secondary | ICD-10-CM | POA: Diagnosis present

## 2023-07-26 DIAGNOSIS — H409 Unspecified glaucoma: Secondary | ICD-10-CM | POA: Diagnosis present

## 2023-07-26 DIAGNOSIS — R0989 Other specified symptoms and signs involving the circulatory and respiratory systems: Secondary | ICD-10-CM | POA: Diagnosis not present

## 2023-07-26 DIAGNOSIS — R6889 Other general symptoms and signs: Secondary | ICD-10-CM | POA: Diagnosis not present

## 2023-07-26 DIAGNOSIS — Z833 Family history of diabetes mellitus: Secondary | ICD-10-CM

## 2023-07-26 DIAGNOSIS — Z89512 Acquired absence of left leg below knee: Secondary | ICD-10-CM

## 2023-07-26 DIAGNOSIS — E662 Morbid (severe) obesity with alveolar hypoventilation: Secondary | ICD-10-CM | POA: Diagnosis present

## 2023-07-26 DIAGNOSIS — Z8673 Personal history of transient ischemic attack (TIA), and cerebral infarction without residual deficits: Secondary | ICD-10-CM | POA: Diagnosis not present

## 2023-07-26 DIAGNOSIS — K3184 Gastroparesis: Secondary | ICD-10-CM | POA: Diagnosis present

## 2023-07-26 DIAGNOSIS — T428X5A Adverse effect of antiparkinsonism drugs and other central muscle-tone depressants, initial encounter: Secondary | ICD-10-CM | POA: Diagnosis present

## 2023-07-26 DIAGNOSIS — R404 Transient alteration of awareness: Secondary | ICD-10-CM | POA: Diagnosis not present

## 2023-07-26 DIAGNOSIS — R4182 Altered mental status, unspecified: Secondary | ICD-10-CM | POA: Diagnosis present

## 2023-07-26 DIAGNOSIS — Z87891 Personal history of nicotine dependence: Secondary | ICD-10-CM

## 2023-07-26 DIAGNOSIS — D631 Anemia in chronic kidney disease: Secondary | ICD-10-CM | POA: Diagnosis not present

## 2023-07-26 DIAGNOSIS — H548 Legal blindness, as defined in USA: Secondary | ICD-10-CM | POA: Diagnosis present

## 2023-07-26 DIAGNOSIS — K219 Gastro-esophageal reflux disease without esophagitis: Secondary | ICD-10-CM | POA: Diagnosis present

## 2023-07-26 DIAGNOSIS — Z992 Dependence on renal dialysis: Secondary | ICD-10-CM

## 2023-07-26 DIAGNOSIS — Z79899 Other long term (current) drug therapy: Secondary | ICD-10-CM

## 2023-07-26 DIAGNOSIS — S199XXA Unspecified injury of neck, initial encounter: Secondary | ICD-10-CM | POA: Diagnosis not present

## 2023-07-26 DIAGNOSIS — I252 Old myocardial infarction: Secondary | ICD-10-CM

## 2023-07-26 DIAGNOSIS — N186 End stage renal disease: Secondary | ICD-10-CM | POA: Diagnosis present

## 2023-07-26 DIAGNOSIS — Z9049 Acquired absence of other specified parts of digestive tract: Secondary | ICD-10-CM

## 2023-07-26 DIAGNOSIS — Z860101 Personal history of adenomatous and serrated colon polyps: Secondary | ICD-10-CM

## 2023-07-26 DIAGNOSIS — Z83438 Family history of other disorder of lipoprotein metabolism and other lipidemia: Secondary | ICD-10-CM

## 2023-07-26 DIAGNOSIS — I160 Hypertensive urgency: Secondary | ICD-10-CM | POA: Diagnosis not present

## 2023-07-26 DIAGNOSIS — S0990XA Unspecified injury of head, initial encounter: Secondary | ICD-10-CM | POA: Diagnosis not present

## 2023-07-26 DIAGNOSIS — I6782 Cerebral ischemia: Secondary | ICD-10-CM | POA: Diagnosis not present

## 2023-07-26 DIAGNOSIS — G9340 Encephalopathy, unspecified: Secondary | ICD-10-CM | POA: Diagnosis present

## 2023-07-26 DIAGNOSIS — N184 Chronic kidney disease, stage 4 (severe): Principal | ICD-10-CM

## 2023-07-26 DIAGNOSIS — I152 Hypertension secondary to endocrine disorders: Secondary | ICD-10-CM | POA: Diagnosis present

## 2023-07-26 DIAGNOSIS — T43295A Adverse effect of other antidepressants, initial encounter: Secondary | ICD-10-CM | POA: Diagnosis present

## 2023-07-26 DIAGNOSIS — Z807 Family history of other malignant neoplasms of lymphoid, hematopoietic and related tissues: Secondary | ICD-10-CM

## 2023-07-26 DIAGNOSIS — R410 Disorientation, unspecified: Secondary | ICD-10-CM | POA: Diagnosis not present

## 2023-07-26 DIAGNOSIS — I739 Peripheral vascular disease, unspecified: Secondary | ICD-10-CM | POA: Diagnosis present

## 2023-07-26 DIAGNOSIS — I2510 Atherosclerotic heart disease of native coronary artery without angina pectoris: Secondary | ICD-10-CM | POA: Diagnosis present

## 2023-07-26 DIAGNOSIS — E1159 Type 2 diabetes mellitus with other circulatory complications: Secondary | ICD-10-CM | POA: Diagnosis present

## 2023-07-26 DIAGNOSIS — E1069 Type 1 diabetes mellitus with other specified complication: Secondary | ICD-10-CM | POA: Diagnosis present

## 2023-07-26 DIAGNOSIS — I120 Hypertensive chronic kidney disease with stage 5 chronic kidney disease or end stage renal disease: Secondary | ICD-10-CM | POA: Diagnosis not present

## 2023-07-26 DIAGNOSIS — Z951 Presence of aortocoronary bypass graft: Secondary | ICD-10-CM

## 2023-07-26 DIAGNOSIS — Z981 Arthrodesis status: Secondary | ICD-10-CM

## 2023-07-26 LAB — PHOSPHORUS: Phosphorus: 6.1 mg/dL — ABNORMAL HIGH (ref 2.5–4.6)

## 2023-07-26 LAB — I-STAT ARTERIAL BLOOD GAS, ED
Acid-base deficit: 1 mmol/L (ref 0.0–2.0)
Bicarbonate: 25.9 mmol/L (ref 20.0–28.0)
Calcium, Ion: 1.2 mmol/L (ref 1.15–1.40)
HCT: 40 % (ref 39.0–52.0)
Hemoglobin: 13.6 g/dL (ref 13.0–17.0)
O2 Saturation: 98 %
Patient temperature: 98.4
Potassium: 4.2 mmol/L (ref 3.5–5.1)
Sodium: 137 mmol/L (ref 135–145)
TCO2: 27 mmol/L (ref 22–32)
pCO2 arterial: 50.8 mmHg — ABNORMAL HIGH (ref 32–48)
pH, Arterial: 7.315 — ABNORMAL LOW (ref 7.35–7.45)
pO2, Arterial: 122 mmHg — ABNORMAL HIGH (ref 83–108)

## 2023-07-26 LAB — I-STAT VENOUS BLOOD GAS, ED
Acid-Base Excess: 0 mmol/L (ref 0.0–2.0)
Bicarbonate: 24.8 mmol/L (ref 20.0–28.0)
Calcium, Ion: 0.98 mmol/L — ABNORMAL LOW (ref 1.15–1.40)
HCT: 31 % — ABNORMAL LOW (ref 39.0–52.0)
Hemoglobin: 10.5 g/dL — ABNORMAL LOW (ref 13.0–17.0)
O2 Saturation: 90 %
Potassium: 8.2 mmol/L (ref 3.5–5.1)
Sodium: 132 mmol/L — ABNORMAL LOW (ref 135–145)
TCO2: 26 mmol/L (ref 22–32)
pCO2, Ven: 39.8 mmHg — ABNORMAL LOW (ref 44–60)
pH, Ven: 7.403 (ref 7.25–7.43)
pO2, Ven: 58 mmHg — ABNORMAL HIGH (ref 32–45)

## 2023-07-26 LAB — CBC WITH DIFFERENTIAL/PLATELET
Abs Immature Granulocytes: 0.04 10*3/uL (ref 0.00–0.07)
Basophils Absolute: 0 10*3/uL (ref 0.0–0.1)
Basophils Relative: 1 %
Eosinophils Absolute: 0.2 10*3/uL (ref 0.0–0.5)
Eosinophils Relative: 3 %
HCT: 36.7 % — ABNORMAL LOW (ref 39.0–52.0)
Hemoglobin: 12.2 g/dL — ABNORMAL LOW (ref 13.0–17.0)
Immature Granulocytes: 1 %
Lymphocytes Relative: 11 %
Lymphs Abs: 1 10*3/uL (ref 0.7–4.0)
MCH: 34.5 pg — ABNORMAL HIGH (ref 26.0–34.0)
MCHC: 33.2 g/dL (ref 30.0–36.0)
MCV: 103.7 fL — ABNORMAL HIGH (ref 80.0–100.0)
Monocytes Absolute: 0.8 10*3/uL (ref 0.1–1.0)
Monocytes Relative: 9 %
Neutro Abs: 6.8 10*3/uL (ref 1.7–7.7)
Neutrophils Relative %: 75 %
Platelets: 156 10*3/uL (ref 150–400)
RBC: 3.54 MIL/uL — ABNORMAL LOW (ref 4.22–5.81)
RDW: 14.6 % (ref 11.5–15.5)
WBC: 8.8 10*3/uL (ref 4.0–10.5)
nRBC: 0 % (ref 0.0–0.2)

## 2023-07-26 LAB — CBC
HCT: 45.2 % (ref 39.0–52.0)
Hemoglobin: 14.5 g/dL (ref 13.0–17.0)
MCH: 33 pg (ref 26.0–34.0)
MCHC: 32.1 g/dL (ref 30.0–36.0)
MCV: 102.7 fL — ABNORMAL HIGH (ref 80.0–100.0)
Platelets: 125 10*3/uL — ABNORMAL LOW (ref 150–400)
RBC: 4.4 MIL/uL (ref 4.22–5.81)
RDW: 14.6 % (ref 11.5–15.5)
WBC: 5.3 10*3/uL (ref 4.0–10.5)
nRBC: 0 % (ref 0.0–0.2)

## 2023-07-26 LAB — LIPASE, BLOOD: Lipase: 39 U/L (ref 11–51)

## 2023-07-26 LAB — PROTIME-INR
INR: 1.2 (ref 0.8–1.2)
Prothrombin Time: 15.7 s — ABNORMAL HIGH (ref 11.4–15.2)

## 2023-07-26 LAB — URINALYSIS, W/ REFLEX TO CULTURE (INFECTION SUSPECTED)
Bilirubin Urine: NEGATIVE
Glucose, UA: 150 mg/dL — AB
Hgb urine dipstick: NEGATIVE
Ketones, ur: NEGATIVE mg/dL
Leukocytes,Ua: NEGATIVE
Nitrite: NEGATIVE
Protein, ur: >=300 mg/dL — AB
Specific Gravity, Urine: 1.016 (ref 1.005–1.030)
pH: 6 (ref 5.0–8.0)

## 2023-07-26 LAB — BRAIN NATRIURETIC PEPTIDE: B Natriuretic Peptide: 62 pg/mL (ref 0.0–100.0)

## 2023-07-26 LAB — ETHANOL: Alcohol, Ethyl (B): <10 mg/dL (ref <10)

## 2023-07-26 LAB — COMPREHENSIVE METABOLIC PANEL WITH GFR
ALT: 23 U/L (ref 0–44)
AST: 42 U/L — ABNORMAL HIGH (ref 15–41)
Albumin: 2.6 g/dL — ABNORMAL LOW (ref 3.5–5.0)
Alkaline Phosphatase: 97 U/L (ref 38–126)
Anion gap: 14 (ref 5–15)
BUN: 48 mg/dL — ABNORMAL HIGH (ref 6–20)
CO2: 22 mmol/L (ref 22–32)
Calcium: 8.6 mg/dL — ABNORMAL LOW (ref 8.9–10.3)
Chloride: 99 mmol/L (ref 98–111)
Creatinine, Ser: 7.92 mg/dL — ABNORMAL HIGH (ref 0.61–1.24)
GFR, Estimated: 7 mL/min — ABNORMAL LOW (ref >60)
Glucose, Bld: 83 mg/dL (ref 70–99)
Potassium: 5 mmol/L (ref 3.5–5.1)
Sodium: 135 mmol/L (ref 135–145)
Total Bilirubin: 0.7 mg/dL (ref 0.0–1.2)
Total Protein: 6.4 g/dL — ABNORMAL LOW (ref 6.5–8.1)

## 2023-07-26 LAB — MAGNESIUM: Magnesium: 1.6 mg/dL — ABNORMAL LOW (ref 1.7–2.4)

## 2023-07-26 LAB — CBG MONITORING, ED
Glucose-Capillary: 73 mg/dL (ref 70–99)
Glucose-Capillary: 65 mg/dL — ABNORMAL LOW (ref 70–99)
Glucose-Capillary: 127 mg/dL — ABNORMAL HIGH (ref 70–99)
Glucose-Capillary: 81 mg/dL (ref 70–99)
Glucose-Capillary: 68 mg/dL — ABNORMAL LOW (ref 70–99)
Glucose-Capillary: 83 mg/dL (ref 70–99)

## 2023-07-26 LAB — CREATININE, SERUM
Creatinine, Ser: 8.21 mg/dL — ABNORMAL HIGH (ref 0.61–1.24)
GFR, Estimated: 7 mL/min — ABNORMAL LOW (ref >60)

## 2023-07-26 LAB — TROPONIN I (HIGH SENSITIVITY)
Troponin I (High Sensitivity): 15 ng/L (ref <18)
Troponin I (High Sensitivity): 14 ng/L (ref <18)

## 2023-07-26 LAB — VITAMIN B12: Vitamin B-12: 676 pg/mL (ref 180–914)

## 2023-07-26 LAB — HEMOGLOBIN A1C
Hgb A1c MFr Bld: 4.9 % (ref 4.8–5.6)
Mean Plasma Glucose: 93.93 mg/dL

## 2023-07-26 LAB — HIV ANTIBODY (ROUTINE TESTING W REFLEX): HIV Screen 4th Generation wRfx: NONREACTIVE

## 2023-07-26 LAB — I-STAT CG4 LACTIC ACID, ED
Lactic Acid, Venous: 1.4 mmol/L (ref 0.5–1.9)
Lactic Acid, Venous: 1.2 mmol/L (ref 0.5–1.9)

## 2023-07-26 LAB — FOLATE: Folate: 14.5 ng/mL (ref >5.9)

## 2023-07-26 LAB — AMMONIA: Ammonia: 30 umol/L (ref 9–35)

## 2023-07-26 MED ORDER — DELFLEX-LC/4.25% DEXTROSE 483 MOSM/L IP SOLN: INTRAPERITONEAL | Status: DC

## 2023-07-26 MED ORDER — INSULIN ASPART 100 UNIT/ML IJ SOLN
0.0000 [IU] | INTRAMUSCULAR | Status: DC
Start: 2023-07-26 — End: 2023-07-30
  Administered 2023-07-27: 3 [IU] via SUBCUTANEOUS
  Administered 2023-07-27 (×2): 1 [IU] via SUBCUTANEOUS
  Administered 2023-07-27: 2 [IU] via SUBCUTANEOUS
  Administered 2023-07-27: 3 [IU] via SUBCUTANEOUS
  Administered 2023-07-28 (×2): 2 [IU] via SUBCUTANEOUS
  Administered 2023-07-28: 1 [IU] via SUBCUTANEOUS
  Administered 2023-07-28 – 2023-07-29 (×3): 2 [IU] via SUBCUTANEOUS
  Administered 2023-07-29: 1 [IU] via SUBCUTANEOUS
  Administered 2023-07-29: 2 [IU] via SUBCUTANEOUS
  Administered 2023-07-29 (×2): 1 [IU] via SUBCUTANEOUS
  Administered 2023-07-30: 2 [IU] via SUBCUTANEOUS
  Administered 2023-07-30: 1 [IU] via SUBCUTANEOUS
  Administered 2023-07-30: 2 [IU] via SUBCUTANEOUS
  Administered 2023-07-30: 3 [IU] via SUBCUTANEOUS

## 2023-07-26 MED ORDER — GENTAMICIN SULFATE 0.1 % EX CREA
1.0000 | TOPICAL_CREAM | Freq: Every day | CUTANEOUS | Status: DC
  Administered 2023-07-26 – 2023-07-30 (×5): 1 via TOPICAL
  Filled 2023-07-26 (×2): qty 15

## 2023-07-26 MED ORDER — EZETIMIBE 10 MG PO TABS
10.0000 mg | ORAL_TABLET | Freq: Every day | ORAL | Status: DC
  Administered 2023-07-27 – 2023-07-30 (×4): 10 mg via ORAL
  Filled 2023-07-26 (×4): qty 1

## 2023-07-26 MED ORDER — HEPARIN SODIUM (PORCINE) 1000 UNIT/ML IJ SOLN: INTRAPERITONEAL | Status: DC | PRN

## 2023-07-26 MED ORDER — HYDRALAZINE HCL 20 MG/ML IJ SOLN
10.0000 mg | Freq: Once | INTRAMUSCULAR | Status: AC
  Administered 2023-07-26: 10 mg via INTRAVENOUS
  Filled 2023-07-26: qty 1

## 2023-07-26 MED ORDER — HEPARIN SODIUM (PORCINE) 1000 UNIT/ML IJ SOLN
INTRAPERITONEAL | Status: DC | PRN
  Filled 2023-07-26 (×2): qty 2000

## 2023-07-26 MED ORDER — DELFLEX-LC/2.5% DEXTROSE 394 MOSM/L IP SOLN: INTRAPERITONEAL | Status: DC

## 2023-07-26 MED ORDER — ASPIRIN 81 MG PO CHEW
81.0000 mg | CHEWABLE_TABLET | Freq: Every day | ORAL | Status: DC
  Administered 2023-07-27 – 2023-07-28 (×2): 81 mg via ORAL
  Filled 2023-07-26 (×2): qty 1

## 2023-07-26 MED ORDER — ATORVASTATIN CALCIUM 80 MG PO TABS
80.0000 mg | ORAL_TABLET | Freq: Every day | ORAL | Status: DC
  Administered 2023-07-29: 80 mg via ORAL
  Filled 2023-07-26 (×4): qty 1

## 2023-07-26 MED ORDER — DELFLEX-LC/2.5% DEXTROSE 394 MOSM/L IP SOLN
INTRAPERITONEAL | Status: DC
Start: 2023-07-26 — End: 2023-07-26

## 2023-07-26 MED ORDER — DEXTROSE 10 % IV SOLN: INTRAVENOUS | Status: DC

## 2023-07-26 MED ORDER — DEXTROSE 50 % IV SOLN
1.0000 | Freq: Once | INTRAVENOUS | Status: AC
  Administered 2023-07-26: 50 mL via INTRAVENOUS
  Filled 2023-07-26: qty 50

## 2023-07-26 MED ORDER — HEPARIN SODIUM (PORCINE) 1000 UNIT/ML IJ SOLN: 7000.0000 [IU] | Freq: Once | INTRAMUSCULAR | Status: DC

## 2023-07-26 MED ORDER — SENNA 8.6 MG PO TABS
2.0000 | ORAL_TABLET | Freq: Every day | ORAL | Status: DC
  Administered 2023-07-27 – 2023-07-28 (×2): 17.2 mg via ORAL
  Filled 2023-07-26 (×4): qty 2

## 2023-07-26 MED ORDER — HEPARIN SODIUM (PORCINE) 1000 UNIT/ML IJ SOLN
INTRAPERITONEAL | Status: DC | PRN
  Filled 2023-07-26: qty 6000

## 2023-07-26 MED ORDER — HEPARIN SODIUM (PORCINE) 5000 UNIT/ML IJ SOLN
5000.0000 [IU] | Freq: Three times a day (TID) | INTRAMUSCULAR | Status: DC
  Administered 2023-07-26 – 2023-07-30 (×12): 5000 [IU] via SUBCUTANEOUS
  Filled 2023-07-26 (×13): qty 1

## 2023-07-26 MED ORDER — IRBESARTAN 75 MG PO TABS
75.0000 mg | ORAL_TABLET | Freq: Every day | ORAL | Status: DC
  Administered 2023-07-29: 75 mg via ORAL
  Filled 2023-07-26 (×5): qty 1

## 2023-07-26 MED ORDER — PANTOPRAZOLE SODIUM 40 MG IV SOLR
40.0000 mg | Freq: Two times a day (BID) | INTRAVENOUS | Status: DC
  Administered 2023-07-26 – 2023-07-30 (×8): 40 mg via INTRAVENOUS
  Filled 2023-07-26 (×8): qty 10

## 2023-07-26 MED ORDER — METOCLOPRAMIDE HCL 5 MG PO TABS
10.0000 mg | ORAL_TABLET | Freq: Every day | ORAL | Status: DC
  Administered 2023-07-29: 10 mg via ORAL
  Filled 2023-07-26 (×3): qty 2

## 2023-07-26 MED ORDER — HEPARIN SODIUM (PORCINE) 1000 UNIT/ML IJ SOLN
INTRAPERITONEAL | Status: DC | PRN
  Filled 2023-07-26: qty 2000

## 2023-07-26 MED ORDER — ACETAMINOPHEN 325 MG PO TABS
325.0000 mg | ORAL_TABLET | Freq: Four times a day (QID) | ORAL | Status: DC | PRN
  Administered 2023-07-28: 325 mg via ORAL
  Filled 2023-07-26: qty 1

## 2023-07-26 MED ORDER — HYDRALAZINE HCL 20 MG/ML IJ SOLN
10.0000 mg | INTRAMUSCULAR | Status: DC | PRN
  Filled 2023-07-26: qty 1

## 2023-07-26 NOTE — ED Triage Notes (Signed)
 BIB EMS from home around midnight last night when he was into his peritoneal dialysis and started having ams.  Got worse through the night and this morning. Patient normal a&ox4 but now only to person and place.  Legally blind in both eyes.

## 2023-07-26 NOTE — ED Notes (Signed)
 Pt to MRI

## 2023-07-26 NOTE — ED Notes (Signed)
 Pt mentation improving. Woke up and stated "I need to get up and get some sugar".  CBG 68.  Pt given D50  Family remains at bedside.

## 2023-07-26 NOTE — ED Notes (Signed)
 MD notified of k of 8.2 on ISTAT.

## 2023-07-26 NOTE — Consult Note (Signed)
 Reason for Consult: Continuity of ESRD care Referring Physician: Fonnie Iba M.D. ([facility])  The patient is encephalopathic and history is obtained entirely from his wife. 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  His wife reports that about 2 weeks ago, he had changes to his antidepressant therapy and last week suffered a fall injuring the left side of his neck for which he was started on baclofen.  He took a single dose on Friday and a single dose yesterday and in the evening started having changes in mental status with increased somnolence, confusion and visual hallucinations.  He was noted to have elevated blood pressures at home and his wife has been appropriately using 2.5% dianeal solution.  There is no record of fever or chills at home and he did not have any cough, sputum production, wheezing or hemoptysis.  He has not had any recent nausea, vomiting or diarrhea.  PD prescription per his wife: CCPD, 5 cycles, 2.4 L (12 L overnight) over 9 hours with Extraneal during the day.  7000 units heparin per 6 L dialysate bag.  Past Medical History:  Diagnosis Date   Anemia    Arthritis    Asthma    as child   Atypical mole 09/22/2022   R spinal mid back, needs excision   Barrett's esophagus 02/16/2014   short segment   Cataract    Chronic kidney disease (CKD), stage IV (severe) (HCC)    Coronary artery disease    DDD (degenerative disc disease), cervical    Diabetes mellitus without complication (HCC)    Diabetic osteomyelitis (HCC)    Diabetic peripheral neuropathy (HCC)    Diabetic ulcer of foot with muscle involvement without evidence of necrosis (HCC)    DIABETIC ULCERATIONS ASSOCIATED WITH IRRITATION LATERAL ANKLE LEFT GREATER THAN RIGHT WITH MILD  CELLULITIS    Diverticulosis    DKA (diabetic ketoacidoses) 08/23/2017   Edema, lower extremity    Fatty liver    Gastroparesis 2015   GERD (gastroesophageal reflux disease)    Gilbert's disease    Glaucoma    Hx of BKA, left (HCC)    Hx of CABG 01/27/2019   2 vessels; LIMA-LAD, SVG-D1   Hx of heart artery stent 11/20/2009   80% D1 stenosis - 2.5 x 18 mm Xience V Everolimus (DES) stent x 1 placed   Hyperlipidemia    Hypertension    Left below-knee amputee (HCC)    Legally blind    Myocardial infarction (HCC) 11/2009   Neuropathy    Osteoarthritis of spine    Osteomyelitis of left foot (HCC)    Peritoneal dialysis catheter in place [facility])    Does dialysis over night.   PONV (postoperative nausea and vomiting)    PVD (peripheral vascular disease) (HCC)    Seasonal allergies    Shoulder pain    Tubular adenoma of colon    Ulcer of foot (HCC) 11/17/2012   DIABETIC ULCERATIONS ASSOCIATED WITH IRRITATION LATERAL ANKLE LEFT GREATER THAN RIGHT WITH MILD CELLULITIS    Past Surgical History:  Procedure Laterality Date   ABDOMINAL AORTOGRAM N/A 05/20/2016   Procedure: Abdominal Aortogram possible intervention;  Surgeon: Jackquelyn Mass, MD;  Location: [facility];  Service: Cardiovascular;  Laterality: N/A;   AMPUTATION Left 09/05/2017   Procedure: AMPUTATION BELOW KNEE;  Surgeon:  Sammye Cristal, MD;  Location: [facility] OR;  Service: Orthopedics;  Laterality: Left;   ANTERIOR CERVICAL DECOMP/DISCECTOMY FUSION N/A 05/07/2017   Procedure: ANTERIOR CERVICAL DECOMPRESSION/DISCECTOMY Remer Cargo PROSTHESIS,PLATE/SCREWS CERVICAL FIVE - CERVICAL SIX;  Surgeon: Garry Kansas, MD;  Location: [facility] OR;  Service: Neurosurgery;  Laterality: N/A;   CAPD INSERTION N/A 08/30/2020   Procedure: LAPAROSCOPIC INSERTION CONTINUOUS AMBULATORY PERITONEAL DIALYSIS  (CAPD) CATHETER;  Surgeon: Emmalene Hare, MD;  Location: [facility] ORS;  Service: General;  Laterality: N/A;   CATARACT EXTRACTION W/  INTRAOCULAR LENS IMPLANT     CATARACT EXTRACTION W/PHACO Left 02/26/2017   Procedure: CATARACT EXTRACTION PHACO AND INTRAOCULAR LENS PLACEMENT (IOC);  Surgeon: Rosa College, MD;  Location: [facility] ORS;  Service: Ophthalmology;  Laterality: Left;  Lot # [id number] H US : 00:25.3 AP%: 6.2 CDE: 1.59    CHOLECYSTECTOMY N/A    COLONOSCOPY WITH PROPOFOL N/A 05/30/2021   Procedure: COLONOSCOPY WITH PROPOFOL;  Surgeon: Nannette Babe, MD;  Location: [facility];  Service: Gastroenterology;  Laterality: N/A;   CORONARY ANGIOPLASTY WITH STENT PLACEMENT  11/20/2009   80% D1 stenosis - 2.5 x 18 mm Xience V Everolimus (DES) stent x 1 placed; LOCATION: [facility]; SURGEON: Starlette Ebbs, MD   CORONARY ARTERY BYPASS GRAFT N/A 01/27/2019   Procedure: OFF PUMP CORONARY ARTERY BYPASS GRAFTING (CABG) X 2 WITH ENDOSCOPIC HARVESTING OF RIGHT GREATER SAPHENOUS VEIN. LIMA TO LAD;  Surgeon: Hilarie Lovely, MD;  Location: [facility] OR;  Service: Open Heart Surgery;  Laterality: N/A;   CORONARY STENT INTERVENTION N/A 01/21/2019   Procedure: CORONARY STENT INTERVENTION;  Surgeon: Arty Binning, MD;  Location: [facility];  Service: Cardiovascular;  Laterality: N/A;   EPIBLEPHERON REPAIR WITH TEAR DUCT PROBING     EYE SURGERY Right 02/09/2014   cataract extraction   INSERTION EXPRESS TUBE SHUNT Right 09/06/2015   Procedure: INSERTION AHMED TUBE SHUNT with tutoplast allograft;  Surgeon: Rosa College, MD;  Location: [facility] ORS;  Service: Ophthalmology;  Laterality: Right;   laparoscopic insertion continuous peritoneal dialysis  2022   LEFT HEART CATH AND CORONARY ANGIOGRAPHY N/A 01/20/2019   Procedure: LEFT HEART CATH AND CORONARY ANGIOGRAPHY;  Surgeon: Avanell Leigh, MD;  Location: [facility];  Service: Cardiovascular;  Laterality: N/A;   POLYPECTOMY  05/30/2021   Procedure: POLYPECTOMY;  Surgeon: Nannette Babe, MD;  Location: [facility];  Service: Gastroenterology;;   TEE WITHOUT CARDIOVERSION N/A  01/27/2019   Procedure: TRANSESOPHAGEAL ECHOCARDIOGRAM (TEE);  Surgeon: Hilarie Lovely, MD;  Location: [facility] OR;  Service: Open Heart Surgery;  Laterality: N/A;   VASECTOMY      Family History  Problem Relation Age of Onset   Hyperlipidemia Mother    Diabetes Mother    Liver disease Mother        NASH   Other Mother        immune thrombocytopenic pupura (ITP)   Heart attack Father    Hyperlipidemia Father    Diabetes Father    Hypertension Father    Lymphoma Father    Hemochromatosis Other        Grandmother (? which side)   Polycythemia Other    Colon cancer Neg Hx    Esophageal cancer Neg Hx    Rectal cancer Neg Hx    Stomach cancer Neg Hx     Social History:  reports that he quit smoking about 15 years ago. His smoking use included cigarettes. He started smoking about 35 years ago. He has a 10 pack-year  smoking history. He has never used smokeless tobacco. He reports that he does not currently use alcohol. He reports that he does not use drugs.  Allergies: No Known Allergies  Medications: I have reviewed the patient's current medications. Scheduled:  aspirin  81 mg Oral Daily   atorvastatin  80 mg Oral QHS   ezetimibe  10 mg Oral Daily   gentamicin cream  1 Application Topical Daily   heparin  5,000 Units Subcutaneous Q8H   insulin aspart  0-6 Units Subcutaneous Q4H   irbesartan  75 mg Oral QHS   metoCLOPramide  10 mg Oral QHS   Continuous:  dextrose     dialysis solution 2.5% low-MG/low-CA     dialysis solution 4.25% low-MG/low-CA         Latest Ref Rng & Units 07/26/2023   12:05 PM 07/26/2023   11:50 AM 12/06/2021    8:37 AM  BMP  Glucose 70 - 99 mg/dL  83  604   BUN 6 - 20 mg/dL  48  33   Creatinine 5.40 - 1.24 mg/dL  9.81  1.91   Sodium 478 - 145 mmol/L 132  135  136   Potassium 3.5 - 5.1 mmol/L 8.2  5.0  4.4   Chloride 98 - 111 mmol/L  99  101   CO2 22 - 32 mmol/L  22  29   Calcium 8.9 - 10.3 mg/dL  8.6  8.8       Latest Ref Rng & Units  07/26/2023   12:05 PM 07/26/2023   11:50 AM 12/06/2021    8:37 AM  CBC  WBC 4.0 - 10.5 K/uL  8.8  8.6   Hemoglobin 13.0 - 17.0 g/dL 29.5  62.1  30.8   Hematocrit 39.0 - 52.0 % 31.0  36.7  34.2   Platelets 150 - 400 K/uL  156  150.0      MR BRAIN WO CONTRAST Result Date: 07/26/2023 CLINICAL DATA:  Mental status change, unknown cause. EXAM: MRI HEAD WITHOUT CONTRAST TECHNIQUE: Multiplanar, multiecho pulse sequences of the brain and surrounding structures were obtained without intravenous contrast. COMPARISON:  Head CT 07/26/2023 and MRI 10/22/2006 FINDINGS: Brain: There is no evidence of an acute infarct, intracranial hemorrhage, mass, midline shift, or extra-axial fluid collection. Chronic bilateral thalamic lacunar infarcts are new from the remote MRI. There are a few remote small-vessel insults in the cerebral white matter as well. There is mild cerebral atrophy. Vascular: Major intracranial vascular flow voids are preserved. Skull and upper cervical spine: Unremarkable bone marrow signal. Sinuses/Orbits: Chronic bilateral sphenoid, ethmoid, and right maxillary sinusitis with prominent T2 hypointensity in the left sphenoid and right maxillary sinuses potentially reflecting allergic fungal sinusitis. Clear mastoid air cells. Bilateral cataract extraction. Other: None. IMPRESSION: 1. No acute intracranial abnormality. 2. 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 3. Chronic sinusitis. Electronically Signed   By: Aundra Lee M.D.   On: 07/26/2023 16:52   CT HEAD WO CONTRAST ( ) Result Date: 07/26/2023 CLINICAL DATA:  Mental status change, unknown cause; Neck trauma, intoxicated or obtunded (Age >= 16y) EXAM: CT HEAD WITHOUT CONTRAST CT CERVICAL SPINE WITHOUT CONTRAST TECHNIQUE: Multidetector CT imaging of the head and cervical spine was performed following the standard protocol without intravenous contrast. Multiplanar CT image reconstructions of the cervical spine were also  generated. RADIATION DOSE REDUCTION: This exam was performed according to the departmental dose-optimization program which includes automated exposure control, adjustment of the mA and/or kV according to patient size and/or  use of iterative reconstruction technique. COMPARISON:  None Available. FINDINGS: CT HEAD FINDINGS Brain: No evidence of large-territorial acute infarction. No parenchymal hemorrhage. No mass lesion. No extra-axial collection. No mass effect or midline shift. No hydrocephalus. Basilar cisterns are patent. Vascular: No hyperdense vessel. Atherosclerotic calcifications are present within the cavernous internal carotid and vertebral arteries. Skull: No acute fracture or focal lesion. Sinuses/Orbits: Complete opacification of the bilateral sphenoid sinuses. Almost complete opacification of the right maxillary and bilateral ethmoid sinuses. Curvilinear calcification of the right maxillary sinus suggestive of chronic versus fungal infection. Otherwise remaining paranasal sinuses and mastoid air cells are clear. Bilateral lens replacement. Otherwise the orbits are unremarkable. Other: None. CT CERVICAL SPINE FINDINGS Alignment: Normal. Skull base and vertebrae: C5-C6 anterior cervical discectomy and fusion. Multilevel moderate degenerative changes of the spine. No associated severe osseous neural foraminal or central canal stenosis. No acute fracture. No aggressive appearing focal osseous lesion or focal pathologic process. Soft tissues and spinal canal: No prevertebral fluid or swelling. No visible canal hematoma. Upper chest: Unremarkable. Other: Atherosclerotic plaque of the carotid arteries within the neck. IMPRESSION: 1. No acute intracranial abnormality. 2. No acute displaced fracture or traumatic listhesis of the cervical spine. 3. Sinus disease as above. Electronically Signed   By: Morgane  Naveau M.D.   On: 07/26/2023 14:22   CT Cervical Spine Wo Contrast Result Date: 07/26/2023 CLINICAL  DATA:  Mental status change, unknown cause; Neck trauma, intoxicated or obtunded (Age >= 16y) EXAM: CT HEAD WITHOUT CONTRAST CT CERVICAL SPINE WITHOUT CONTRAST TECHNIQUE: Multidetector CT imaging of the head and cervical spine was performed following the standard protocol without intravenous contrast. Multiplanar CT image reconstructions of the cervical spine were also generated. RADIATION DOSE REDUCTION: This exam was performed according to the departmental dose-optimization program which includes automated exposure control, adjustment of the mA and/or kV according to patient size and/or use of iterative reconstruction technique. COMPARISON:  None Available. FINDINGS: CT HEAD FINDINGS Brain: No evidence of large-territorial acute infarction. No parenchymal hemorrhage. No mass lesion. No extra-axial collection. No mass effect or midline shift. No hydrocephalus. Basilar cisterns are patent. Vascular: No hyperdense vessel. Atherosclerotic calcifications are present within the cavernous internal carotid and vertebral arteries. Skull: No acute fracture or focal lesion. Sinuses/Orbits: Complete opacification of the bilateral sphenoid sinuses. Almost complete opacification of the right maxillary and bilateral ethmoid sinuses. Curvilinear calcification of the right maxillary sinus suggestive of chronic versus fungal infection. Otherwise remaining paranasal sinuses and mastoid air cells are clear. Bilateral lens replacement. Otherwise the orbits are unremarkable. Other: None. CT CERVICAL SPINE FINDINGS Alignment: Normal. Skull base and vertebrae: C5-C6 anterior cervical discectomy and fusion. Multilevel moderate degenerative changes of the spine. No associated severe osseous neural foraminal or central canal stenosis. No acute fracture. No aggressive appearing focal osseous lesion or focal pathologic process. Soft tissues and spinal canal: No prevertebral fluid or swelling. No visible canal hematoma. Upper chest:  Unremarkable. Other: Atherosclerotic plaque of the carotid arteries within the neck. IMPRESSION: 1. No acute intracranial abnormality. 2. No acute displaced fracture or traumatic listhesis of the cervical spine. 3. Sinus disease as above. Electronically Signed   By: Morgane  Naveau M.D.   On: 07/26/2023 14:22   DG Chest Port 1 View Result Date: 07/26/2023 CLINICAL DATA:  Altered mental status. EXAM: PORTABLE CHEST 1 VIEW COMPARISON:  08/10/2020 FINDINGS: The cardio pericardial silhouette is enlarged. Low lung volumes. The lungs are clear without focal pneumonia, edema, pneumothorax or pleural effusion. No acute bony abnormality. Telemetry  leads overlie the chest. IMPRESSION: Low volume film without acute cardiopulmonary findings. Electronically Signed   By: Donnal Fusi M.D.   On: 07/26/2023 11:29    Review of Systems  Unable to perform ROS: Mental status change   Blood pressure (!) 178/101, pulse 65, temperature 98.1 F (36.7 C), resp. rate (!) 8, height 5\' 10"  (1.778 m), weight 115.7 kg, SpO2 96%. Physical Exam Vitals reviewed.  Constitutional:      Appearance: He is obese. He is toxic-appearing.  HENT:     Head: Normocephalic and atraumatic.     Right Ear: External ear normal.     Left Ear: External ear normal.     Nose: Nose normal.  Cardiovascular:     Rate and Rhythm: Normal rate and regular rhythm.     Pulses: Normal pulses.     Heart sounds: Normal heart sounds.  Pulmonary:     Breath sounds: Normal breath sounds.  Abdominal:     General: Bowel sounds are normal.     Palpations: Abdomen is soft.     Tenderness: There is no abdominal tenderness.     Comments: PD catheter in situ  Musculoskeletal:     Cervical back: Normal range of motion and neck supple.     Comments: Trace right leg edema, left leg status post BKA  Neurological:     Mental Status: He is alert.     Comments: Altered mental status, snoring loudly and unresponsive to shaking     Assessment/Plan: 1.   Altered mental status: Suspected currently to be pharmacologically induced with recent baclofen administration in a patient that routinely takes antidepressant therapy and gabapentin; suspect cumulative toxicity resulting in depression of mental status.  With his elevated blood pressure, appropriately concerned about hypertensive encephalopathy and will order peritoneal dialysis overnight with ultrafiltration.  Imaging negative for intracranial process. 2.  End-stage renal disease: Resume peritoneal dialysis per outpatient prescription, hold off on daytime icodextrin at this time. 3.  Hypertension: Significantly elevated, will undertake peritoneal dialysis with 4.5% PD solution for UF/blood pressure management. 4.  Anemia: Hemoglobin and hematocrit currently at goal, no indication for ESA therapy. 5.  Secondary hyperparathyroidism: Trend calcium and phosphorus levels and restart phosphorus binder when mental status allows for adequate oral intake.  Melodie Spry 07/26/2023, 6:06 PM

## 2023-07-26 NOTE — ED Provider Notes (Signed)
 [place] [facility] DE[facility]MENT AT [facility] Provider Note   CSN: [id number] Arrival date & time: 07/26/23  1051     History  Chief Complaint  Patient presents with   Altered Mental Status    Chase Clark is a 58 y.o. male.  58 year old male diabetic, blind, on peritoneal dialysis presenting emergency department for altered mental status.  Last known normal last night around 8 PM per family who is in room.  Typically alert and oriented and largely independent although does require help with ADLs given his blindness and left BKA.  Family noted some confusion last night.  Symptoms seemingly persisted this morning.  No focal deficits noted per family.  No recent infectious type symptoms or complaints the past several days.  On my exam patient is pleasantly confused.  Able to tell name and that he is in EMS unit.  Denies pain.   Altered Mental Status      Home Medications Prior to Admission medications   Medication Sig Start Date End Date Taking? Authorizing Provider  acetaminophen (TYLENOL) 325 MG tablet Take 1 tablet (325 mg total) by mouth every 6 (six) hours as needed for mild pain (pain score 1-3 or temp > 100.5). 09/06/17   Laliberte, Danielle, PA-C  aspirin (ASPIRIN CHILDRENS) 81 MG chewable tablet Chew 1 tablet (81 mg total) by mouth daily. 03/04/19   Lightfoot, Marinell Siad, MD  atorvastatin (LIPITOR) 80 MG tablet TAKE 1 TABLET BY MOUTH EVERY DAY Patient taking differently: Take 80 mg by mouth at bedtime. 12/01/19   Kent Pear, MD  azelastine (ASTELIN) 0.1 % nasal spray Place 2 sprays into both nostrils 2 (two) times daily. Use in each nostril as directed 09/16/22   Kent Pear, MD  brimonidine-timolol (COMBIGAN) 0.2-0.5 % ophthalmic solution Place 1 drop into both eyes every 12 (twelve) hours.    [provider]  Cholecalciferol (VITAMIN D) 50 MCG (2000 UT) CAPS Take 2,000 Units by mouth daily.    [provider]  Continuous Glucose  Sensor (DEXCOM G7 SENSOR) MISC USE 1 DEVICE AS DIRECTED 09/03/22   Shamleffer, Ibtehal Jaralla, MD  dorzolamide (TRUSOPT) 2 % ophthalmic solution Place 1 drop into both eyes 2 (two) times daily. 10/18/19   [provider]  escitalopram (LEXAPRO) 10 MG tablet Take 10 mg by mouth daily. 04/26/23   [provider]  esomeprazole (NEXIUM) 20 MG capsule Take 20 mg by mouth daily.    [provider]  ezetimibe (ZETIA) 10 MG tablet Take 1 tablet (10 mg total) by mouth daily. 07/06/23   Thersia Flax, MD  FLOMAX 0.4 MG CAPS capsule 0.4 mg at bedtime. 04/03/21   [provider]  fluticasone (FLONASE) 50 MCG/ACT nasal spray SPRAY 2 SPRAYS INTO EACH NOSTRIL EVERY DAY 11/14/19   Kent Pear, MD  gabapentin (NEURONTIN) 300 MG capsule TAKE 1 CAPSULE BY MOUTH TWICE A DAY 02/10/23   Kent Pear, MD  gentamicin cream (GARAMYCIN) 0.1 % Apply 1 application topically every other day. Around port 04/05/21   [provider]  insulin degludec (TRESIBA FLEXTOUCH) 200 UNIT/ML FlexTouch Pen Inject 42 Units into the skin in the morning and at bedtime. 09/24/22   Shamleffer, Ibtehal Jaralla, MD  insulin lispro (HUMALOG KWIKPEN) 100 UNIT/ML KwikPen Max daily 45 units Patient taking differently: Max daily 45 units(Sliding scale) 09/18/22   Shamleffer, Ibtehal Jaralla, MD  Insulin NPH, Human,, Isophane, (HUMULIN N KWIKPEN) 100 UNIT/ML Kiwkpen Inject 70 Units into the skin daily  in the afternoon. To be taken at the start of dialysis 09/24/22   Shamleffer, Ibtehal Jaralla, MD  Insulin Pen Needle 32G X 4 MM MISC Inject 1 Device into the skin 5 (five) times daily. Use to inject Lantus and Novolog up to 5 times daily 11/15/20   Shamleffer, Ibtehal Jaralla, MD  Insulin Syringe-Needle U-100 28G X 1/2" 1 ML MISC 1 Device by Does not apply route daily in the afternoon. 05/08/22   Shamleffer, Ibtehal Jaralla, MD  irbesartan (AVAPRO) 75 MG tablet Take 75 mg by mouth at bedtime. 04/08/22    [provider]  KLOR-CON M20 20 MEQ tablet Take 20 mEq by mouth daily.    [provider]  lactulose (CHRONULAC) 10 GM/15ML solution TAKE 30 MLS (20 G TOTAL) BY MOUTH AS NEEDED. 11/02/20   Pyrtle, Amber Bail, MD  latanoprost (XALATAN) 0.005 % ophthalmic solution Place 1 drop into both eyes 2 (two) times daily.    [provider]  metoCLOPramide (REGLAN) 10 MG tablet Take 1 tablet (10 mg total) by mouth at bedtime. 02/06/23   McMichael, Elba Greathouse, PA-C  midodrine (PROAMATINE) 5 MG tablet Take 5 mg by mouth 2 (two) times daily as needed (Below 100). 09/27/20   [provider]  mometasone (ELOCON) 0.1 % cream Apply 1 Application topically as directed. Qd to bid to aa right ankle, right lower leg until improved, then prn flares 03/30/23   Artemio Larry, MD  Multiple Vitamin (MULTIVITAMIN ADULT PO) Take 1 tablet by mouth daily.    [provider]  mupirocin ointment (BACTROBAN) 2 % Apply 1 Application topically daily. Every day to excision site on back 10/27/22   Artemio Larry, MD  ondansetron (ZOFRAN-ODT) 4 MG disintegrating tablet Take 4 mg by mouth every 8 (eight) hours as needed for nausea or vomiting.    [provider]  PARoxetine (PAXIL) 10 MG tablet Take 10 mg by mouth daily. 08/22/20 12/06/21  [provider]  senna (SENOKOT) 8.6 MG tablet Take 2 tablets by mouth daily.    [provider]  sertraline (ZOLOFT) 25 MG tablet Take 25 mg by mouth daily. 08/19/22   [provider]  Testosterone 20.25 MG/ACT (1.62%) GEL Place 2 Pump onto the skin daily in the afternoon. 05/15/23   Shamleffer, Ibtehal Jaralla, MD  torsemide (DEMADEX) 20 MG tablet Take 20-40 mg by mouth See admin instructions. Take 40 mg in the morning and 40 mg in the afternoon    [provider]      Allergies    Patient has no known allergies.    Review of Systems   Review of Systems  Physical Exam Updated Vital Signs BP (!) 178/101   Pulse 67    Temp 98.1 F (36.7 C)   Resp 12   Ht 5\' 10"  (1.778 m)   Wt 115.7 kg   SpO2 100%   BMI 36.59 kg/m  Physical Exam Vitals and nursing note reviewed.  Constitutional:      Appearance: He is obese. He is not toxic-appearing.  HENT:     Head: Normocephalic.     Nose: Nose normal.  Eyes:     Pupils: Pupils are equal, round, and reactive to light.  Cardiovascular:     Rate and Rhythm: Normal rate and regular rhythm.  Pulmonary:     Effort: Pulmonary effort is normal.     Breath sounds: Normal breath sounds.  Abdominal:     General: Abdomen is flat. There is no distension.  Palpations: Abdomen is soft.     Tenderness: There is no abdominal tenderness. There is no guarding or rebound.  Musculoskeletal:        General: Normal range of motion.     Comments: Left BKA  Skin:    General: Skin is warm and dry.     Capillary Refill: Capillary refill takes less than 2 seconds.  Neurological:     Mental Status: He is alert. He is disoriented.     Cranial Nerves: No cranial nerve deficit.     Sensory: No sensory deficit.     Motor: No weakness.     ED Results / Procedures / Treatments   Labs (all labs ordered are listed, but only abnormal results are displayed) Labs Reviewed  COMPREHENSIVE METABOLIC PANEL WITH GFR - Abnormal; Notable for the following components:      Result Value   BUN 48 (*)    Creatinine, Ser 7.92 (*)    Calcium 8.6 (*)    Total Protein 6.4 (*)    Albumin 2.6 (*)    AST 42 (*)    GFR, Estimated 7 (*)    All other components within normal limits  CBC WITH DIFFERENTIAL/PLATELET - Abnormal; Notable for the following components:   RBC 3.54 (*)    Hemoglobin 12.2 (*)    HCT 36.7 (*)    MCV 103.7 (*)    MCH 34.5 (*)    All other components within normal limits  PROTIME-INR - Abnormal; Notable for the following components:   Prothrombin Time 15.7 (*)    All other components within normal limits  URINALYSIS, W/ REFLEX TO CULTURE (INFECTION SUSPECTED) -  Abnormal; Notable for the following components:   Glucose, UA 150 (*)    Protein, ur >=300 (*)    Bacteria, UA RARE (*)    All other components within normal limits  I-STAT VENOUS BLOOD GAS, ED - Abnormal; Notable for the following components:   pCO2, Ven 39.8 (*)    pO2, Ven 58 (*)    Sodium 132 (*)    Potassium 8.2 (*)    Calcium, Ion 0.98 (*)    HCT 31.0 (*)    Hemoglobin 10.5 (*)    All other components within normal limits  CBG MONITORING, ED - Abnormal; Notable for the following components:   Glucose-Capillary 65 (*)    All other components within normal limits  CBG MONITORING, ED - Abnormal; Notable for the following components:   Glucose-Capillary 127 (*)    All other components within normal limits  CULTURE, BLOOD (ROUTINE X 2)  CULTURE, BLOOD (ROUTINE X 2)  BRAIN NATRIURETIC PEPTIDE  LIPASE, BLOOD  AMMONIA  I-STAT CG4 LACTIC ACID, ED  CBG MONITORING, ED  I-STAT CG4 LACTIC ACID, ED  CBG MONITORING, ED  TROPONIN I (HIGH SENSITIVITY)  TROPONIN I (HIGH SENSITIVITY)    EKG EKG Interpretation Date/Time:  Sunday July 26 2023 11:04:49 EDT Ventricular Rate:  67 PR Interval:  217 QRS Duration:  95 QT Interval:  423 QTC Calculation: 447 R Axis:   62  Text Interpretation: Sinus rhythm Prolonged PR interval Borderline repolarization abnormality Confirmed by Elise Guile [id number]) on 07/26/2023 12:10:58 PM  Radiology MR BRAIN WO CONTRAST Result Date: 07/26/2023 CLINICAL DATA:  Mental status change, unknown cause. EXAM: MRI HEAD WITHOUT CONTRAST TECHNIQUE: Multiplanar, multiecho pulse sequences of the brain and surrounding structures were obtained without intravenous contrast. COMPARISON:  Head CT 07/26/2023 and MRI 10/22/2006 FINDINGS: Brain: There is no evidence of an  acute infarct, intracranial hemorrhage, mass, midline shift, or extra-axial fluid collection. Chronic bilateral thalamic lacunar infarcts are new from the remote MRI. There are a few remote small-vessel  insults in the cerebral white matter as well. There is mild cerebral atrophy. Vascular: Major intracranial vascular flow voids are preserved. Skull and upper cervical spine: Unremarkable bone marrow signal. Sinuses/Orbits: Chronic bilateral sphenoid, ethmoid, and right maxillary sinusitis with prominent T2 hypointensity in the left sphenoid and right maxillary sinuses potentially reflecting allergic fungal sinusitis. Clear mastoid air cells. Bilateral cataract extraction. Other: None. IMPRESSION: 1. No acute intracranial abnormality. 2. 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 3. Chronic sinusitis. Electronically Signed   By: Aundra Lee M.D.   On: 07/26/2023 16:52   CT HEAD WO CONTRAST ( ) Result Date: 07/26/2023 CLINICAL DATA:  Mental status change, unknown cause; Neck trauma, intoxicated or obtunded (Age >= 16y) EXAM: CT HEAD WITHOUT CONTRAST CT CERVICAL SPINE WITHOUT CONTRAST TECHNIQUE: Multidetector CT imaging of the head and cervical spine was performed following the standard protocol without intravenous contrast. Multiplanar CT image reconstructions of the cervical spine were also generated. RADIATION DOSE REDUCTION: This exam was performed according to the departmental dose-optimization program which includes automated exposure control, adjustment of the mA and/or kV according to patient size and/or use of iterative reconstruction technique. COMPARISON:  None Available. FINDINGS: CT HEAD FINDINGS Brain: No evidence of large-territorial acute infarction. No parenchymal hemorrhage. No mass lesion. No extra-axial collection. No mass effect or midline shift. No hydrocephalus. Basilar cisterns are patent. Vascular: No hyperdense vessel. Atherosclerotic calcifications are present within the cavernous internal carotid and vertebral arteries. Skull: No acute fracture or focal lesion. Sinuses/Orbits: Complete opacification of the bilateral sphenoid sinuses. Almost complete  opacification of the right maxillary and bilateral ethmoid sinuses. Curvilinear calcification of the right maxillary sinus suggestive of chronic versus fungal infection. Otherwise remaining paranasal sinuses and mastoid air cells are clear. Bilateral lens replacement. Otherwise the orbits are unremarkable. Other: None. CT CERVICAL SPINE FINDINGS Alignment: Normal. Skull base and vertebrae: C5-C6 anterior cervical discectomy and fusion. Multilevel moderate degenerative changes of the spine. No associated severe osseous neural foraminal or central canal stenosis. No acute fracture. No aggressive appearing focal osseous lesion or focal pathologic process. Soft tissues and spinal canal: No prevertebral fluid or swelling. No visible canal hematoma. Upper chest: Unremarkable. Other: Atherosclerotic plaque of the carotid arteries within the neck. IMPRESSION: 1. No acute intracranial abnormality. 2. No acute displaced fracture or traumatic listhesis of the cervical spine. 3. Sinus disease as above. Electronically Signed   By: Morgane  Naveau M.D.   On: 07/26/2023 14:22   CT Cervical Spine Wo Contrast Result Date: 07/26/2023 CLINICAL DATA:  Mental status change, unknown cause; Neck trauma, intoxicated or obtunded (Age >= 16y) EXAM: CT HEAD WITHOUT CONTRAST CT CERVICAL SPINE WITHOUT CONTRAST TECHNIQUE: Multidetector CT imaging of the head and cervical spine was performed following the standard protocol without intravenous contrast. Multiplanar CT image reconstructions of the cervical spine were also generated. RADIATION DOSE REDUCTION: This exam was performed according to the departmental dose-optimization program which includes automated exposure control, adjustment of the mA and/or kV according to patient size and/or use of iterative reconstruction technique. COMPARISON:  None Available. FINDINGS: CT HEAD FINDINGS Brain: No evidence of large-territorial acute infarction. No parenchymal hemorrhage. No mass lesion. No  extra-axial collection. No mass effect or midline shift. No hydrocephalus. Basilar cisterns are patent. Vascular: No hyperdense vessel. Atherosclerotic calcifications are present within the cavernous internal carotid and  vertebral arteries. Skull: No acute fracture or focal lesion. Sinuses/Orbits: Complete opacification of the bilateral sphenoid sinuses. Almost complete opacification of the right maxillary and bilateral ethmoid sinuses. Curvilinear calcification of the right maxillary sinus suggestive of chronic versus fungal infection. Otherwise remaining paranasal sinuses and mastoid air cells are clear. Bilateral lens replacement. Otherwise the orbits are unremarkable. Other: None. CT CERVICAL SPINE FINDINGS Alignment: Normal. Skull base and vertebrae: C5-C6 anterior cervical discectomy and fusion. Multilevel moderate degenerative changes of the spine. No associated severe osseous neural foraminal or central canal stenosis. No acute fracture. No aggressive appearing focal osseous lesion or focal pathologic process. Soft tissues and spinal canal: No prevertebral fluid or swelling. No visible canal hematoma. Upper chest: Unremarkable. Other: Atherosclerotic plaque of the carotid arteries within the neck. IMPRESSION: 1. No acute intracranial abnormality. 2. No acute displaced fracture or traumatic listhesis of the cervical spine. 3. Sinus disease as above. Electronically Signed   By: Morgane  Naveau M.D.   On: 07/26/2023 14:22   DG Chest Port 1 View Result Date: 07/26/2023 CLINICAL DATA:  Altered mental status. EXAM: PORTABLE CHEST 1 VIEW COMPARISON:  08/10/2020 FINDINGS: The cardio pericardial silhouette is enlarged. Low lung volumes. The lungs are clear without focal pneumonia, edema, pneumothorax or pleural effusion. No acute bony abnormality. Telemetry leads overlie the chest. IMPRESSION: Low volume film without acute cardiopulmonary findings. Electronically Signed   By: Donnal Fusi M.D.   On: 07/26/2023  11:29    Procedures .Critical Care  Performed by: Rolinda Climes, DO Authorized by: Rolinda Climes, DO   Critical care provider statement:    Critical care time (minutes):  30   Critical care was necessary to treat or prevent imminent or life-threatening deterioration of the following conditions:  CNS failure or compromise and circulatory failure   Critical care was time spent personally by me on the following activities:  Development of treatment plan with patient or surrogate, discussions with consultants, evaluation of patient's response to treatment, examination of patient, ordering and review of laboratory studies, ordering and review of radiographic studies, ordering and performing treatments and interventions, pulse oximetry, re-evaluation of patient's condition and review of old charts     Medications Ordered in ED Medications  dextrose 50 % solution 50 mL (50 mLs Intravenous Given 07/26/23 1402)  hydrALAZINE (APRESOLINE) injection 10 mg (10 mg Intravenous Given 07/26/23 1706)    ED Course/ Medical Decision Making/ A&P Clinical Course as of 07/26/23 1712  Sun Jul 26, 2023  1134 DG Chest Port 1 View IMPRESSION: Low volume film without acute cardiopulmonary findings.   Electronically Signed   [TY]  1253 Potassium(!!): 8.2 No ecg changes; suspect false high. Awaiting cmp [TY]  1308 Potassium: 5.0 [TY]  1425 CT HEAD WO CONTRAST ( ) IMPRESSION: 1. No acute intracranial abnormality. 2. No acute displaced fracture or traumatic listhesis of the cervical spine. 3. Sinus disease as above.   Electronically Signed   By: Morgane  Naveau M.D.   On: 07/26/2023 14:22   [TY]    Clinical Course User Index [TY] Rolinda Climes, DO                                 Medical Decision Making Is a 58 year old male with complex past medical history to include diabetes, ESRD on peritoneal dialysis, blindness, obesity, CAD, left BKA presented to the emergency department for  altered mental status.  Last known normal was last  night before starting peritoneal dialysis.  Family members at bedside to provide notes history.  Note that he is typically normal individual, but does require some help getting his blindness.  He is seemingly confused on my exam, repeating some phrases of mine.  I do not appreciate any focal neurodeficits on exam, although exam is partially limited by patient's participation.  Lower suspicion for acute stroke.  Unclear etiology for patient's altered mental status.  Family notes that he did suffer a fall several days ago and was prescribed baclofen, has taken 1 pill Thursday night and Friday night.  CT head without acute abnormality.  He has no significant metabolic derangements on his comprehensive panel.  He is however uremic and azotemic.  Benign abdominal exam.  Low suspicion for SBP as well as he has no leukocytosis, no fever no tachycardia.  His ammonia is normal.  Lipase is normal.  Cardiac enzymes normal.  Symptoms not secondary to cardiac etiology.  Lactate is normal.  UA without evidence of UTI.  MRI negative for acute stroke.  Blood pressure is elevated here 171/95 initially, did get up to 200 systolic, but is currently 178/101.  Hypertensive urgency also on the differential.  Given IV hydralazine.  Given patient's continued encephalopathy will admit for further management.  Amount and/or Complexity of Data Reviewed Independent Historian:     Details: Family notes that patient typically alert and oriented.  They also note recent baclofen use External Data Reviewed:     Details: Not on blood thinner. Labs: ordered. Decision-making details documented in ED Course. Radiology: ordered and independent interpretation performed. Decision-making details documented in ED Course.    Details: No acute hemorrhage on CT head or MRI on my independent interpretation. Discussion of management or test interpretation with external provider(s): Given patient's  uremia and azotemia nephrology consulted.  No need for emergent dialysis, but will consult on patient.  Risk Prescription drug management. Decision regarding hospitalization.         Final Clinical Impression(s) / ED Diagnoses Final diagnoses:  Altered mental status, unspecified altered mental status type    Rx / DC Orders ED Discharge Orders     None         Rolinda Climes, DO 07/26/23 1712

## 2023-07-26 NOTE — H&P (Signed)
 History and Physical  Chase Clark ZOX:[id number] DOB: 1965-10-16 DOA: 07/26/2023  Referring physician: Harmon Dun. Maple Hudson, DO PCP: Glori Luis, MD (Inactive)  Outpatient Specialists: Dr. Thedore Mins, nephrologist Patient coming from: Home  Chief Complaint: Altered mentation  HPI: Patient is a 58 year old male, obese, with past medical history significant for end-stage renal disease on peritoneal dialysis, hypertension, diabetes mellitus, legally blind, PVD, history of CABG and left BKA, amongst multiple medical and cardiac problems.  Patient was seen alongside patient's wife and daughter.  Most of the history came from patient's wife.  Patient remained very sleepy.  According to the wife, patient became more tired last night during peritoneal dialysis, sleeping upside down in his bed with bizarre behavior.  No associated fever, chills, abdominal pain, nausea or vomiting or any other constitutional symptoms reported.  Patient has been able to move all his extremities.  Patient was brought to the hospital for further assessment and management.  On presentation to the hospital, blood pressure was 202/108 mmHg.  Patient was given IV hydralazine, and the blood pressure has improved significantly.  CT head is nonrevealing.  MRI brain has not shown any acute findings.  Intermittent low blood sugar (down to 65) requiring intermittent D50 has been noted.  Chemistry revealed sodium of 135, potassium of 5, BUN of 48, serum creatinine of 7.92.  LFTs revealed albumin of 2.6, AST of 42, ALT of 23 and total protein of 6.4.  Ammonia level is 30.  CBC revealed WBC of 8.8, hemoglobin of 12.2, hematocrit 36.7, MCV of 103.7 and platelet count of 156.  Patient snores, with associated daytime somnolence, but has never been officially tested or diagnosed with OSA.  Patient will be admitted for further assessment and management.  ED Course: See above documentation. Pertinent labs: See above documentation. EKG:  Independently reviewed.  Imaging: independently reviewed.   Review of Systems:  Unobtainable.  Past Medical History:  Diagnosis Date   Anemia    Arthritis    Asthma    as child   Atypical mole 09/22/2022   R spinal mid back, needs excision   Barrett's esophagus 02/16/2014   short segment   Cataract    Chronic kidney disease (CKD), stage IV (severe) (HCC)    Coronary artery disease    DDD (degenerative disc disease), cervical    Diabetes mellitus without complication (HCC)    Diabetic osteomyelitis (HCC)    Diabetic peripheral neuropathy (HCC)    Diabetic ulcer of foot with muscle involvement without evidence of necrosis (HCC)    DIABETIC ULCERATIONS ASSOCIATED WITH IRRITATION LATERAL ANKLE LEFT GREATER THAN RIGHT WITH MILD CELLULITIS    Diverticulosis    DKA (diabetic ketoacidoses) 08/23/2017   Edema, lower extremity    Fatty liver    Gastroparesis 2015   GERD (gastroesophageal reflux disease)    Gilbert's disease    Glaucoma    Hx of BKA, left (HCC)    Hx of CABG 01/27/2019   2 vessels; LIMA-LAD, SVG-D1   Hx of heart artery stent 11/20/2009   80% D1 stenosis - 2.5 x 18 mm Xience V Everolimus (DES) stent x 1 placed   Hyperlipidemia    Hypertension    Left below-knee amputee (HCC)    Legally blind    Myocardial infarction (HCC) 11/2009   Neuropathy    Osteoarthritis of spine    Osteomyelitis of left foot (HCC)    Peritoneal dialysis catheter in place [facility])    Does dialysis over night.   PONV (  postoperative nausea and vomiting)    PVD (peripheral vascular disease) (HCC)    Seasonal allergies    Shoulder pain    Tubular adenoma of colon    Ulcer of foot (HCC) 11/17/2012   DIABETIC ULCERATIONS ASSOCIATED WITH IRRITATION LATERAL ANKLE LEFT GREATER THAN RIGHT WITH MILD CELLULITIS    Past Surgical History:  Procedure Laterality Date   ABDOMINAL AORTOGRAM N/A 05/20/2016   Procedure: Abdominal Aortogram possible intervention;  Surgeon: Jackquelyn Mass, MD;   Location: [facility] INVASIVE CV LAB;  Service: Cardiovascular;  Laterality: N/A;   AMPUTATION Left 09/05/2017   Procedure: AMPUTATION BELOW KNEE;  Surgeon: Sammye Cristal, MD;  Location: [facility] OR;  Service: Orthopedics;  Laterality: Left;   ANTERIOR CERVICAL DECOMP/DISCECTOMY FUSION N/A 05/07/2017   Procedure: ANTERIOR CERVICAL DECOMPRESSION/DISCECTOMY Remer Cargo PROSTHESIS,PLATE/SCREWS CERVICAL FIVE - CERVICAL SIX;  Surgeon: Garry Kansas, MD;  Location: [facility] OR;  Service: Neurosurgery;  Laterality: N/A;   CAPD INSERTION N/A 08/30/2020   Procedure: LAPAROSCOPIC INSERTION CONTINUOUS AMBULATORY PERITONEAL DIALYSIS  (CAPD) CATHETER;  Surgeon: Emmalene Hare, MD;  Location: [facility] ORS;  Service: General;  Laterality: N/A;   CATARACT EXTRACTION W/ INTRAOCULAR LENS IMPLANT     CATARACT EXTRACTION W/PHACO Left 02/26/2017   Procedure: CATARACT EXTRACTION PHACO AND INTRAOCULAR LENS PLACEMENT (IOC);  Surgeon: Rosa College, MD;  Location: [facility] ORS;  Service: Ophthalmology;  Laterality: Left;  Lot # [id number] H US : 00:25.3 AP%: 6.2 CDE: 1.59    CHOLECYSTECTOMY N/A    COLONOSCOPY WITH PROPOFOL N/A 05/30/2021   Procedure: COLONOSCOPY WITH PROPOFOL;  Surgeon: Nannette Babe, MD;  Location: [facility] ENDOSCOPY;  Service: Gastroenterology;  Laterality: N/A;   CORONARY ANGIOPLASTY WITH STENT PLACEMENT  11/20/2009   80% D1 stenosis - 2.5 x 18 mm Xience V Everolimus (DES) stent x 1 placed; LOCATION: [facility]; SURGEON: Starlette Ebbs, MD   CORONARY ARTERY BYPASS GRAFT N/A 01/27/2019   Procedure: OFF PUMP CORONARY ARTERY BYPASS GRAFTING (CABG) X 2 WITH ENDOSCOPIC HARVESTING OF RIGHT GREATER SAPHENOUS VEIN. LIMA TO LAD;  Surgeon: Hilarie Lovely, MD;  Location: [facility] OR;  Service: Open Heart Surgery;  Laterality: N/A;   CORONARY STENT INTERVENTION N/A 01/21/2019   Procedure: CORONARY STENT INTERVENTION;  Surgeon: Arty Binning, MD;  Location: [facility] INVAS[facility] CV LAB;  Service: Cardiovascular;  Laterality: N/A;   EPIBLEPHERON  REPAIR WITH TEAR DUCT PROBING     EYE SURGERY Right 02/09/2014   cataract extraction   INSERTION EXPRESS TUBE SHUNT Right 09/06/2015   Procedure: INSERTION AHMED TUBE SHUNT with tutoplast allograft;  Surgeon: Rosa College, MD;  Location: [facility] ORS;  Service: Ophthalmology;  Laterality: Right;   laparoscopic insertion continuous peritoneal dialysis  2022   LEFT HEART CATH AND CORONARY ANGIOGRAPHY N/A 01/20/2019   Procedure: LEFT HEART CATH AND CORONARY ANGIOGRAPHY;  Surgeon: Avanell Leigh, MD;  Location: [facility];  Service: Cardiovascular;  Laterality: N/A;   POLYPECTOMY  05/30/2021   Procedure: POLYPECTOMY;  Surgeon: Nannette Babe, MD;  Location: [facility];  Service: Gastroenterology;;   TEE WITHOUT CARDIOVERSION N/A 01/27/2019   Procedure: TRANSESOPHAGEAL ECHOCARDIOGRAM (TEE);  Surgeon: Hilarie Lovely, MD;  Location: [facility] OR;  Service: Open Heart Surgery;  Laterality: N/A;   VASECTOMY       reports that he quit smoking about 15 years ago. His smoking use included cigarettes. He started smoking about 35 years ago. He has a 10 pack-year smoking history. He has never used smokeless tobacco. He reports that he does not currently use alcohol. He  reports that he does not use drugs.  No Known Allergies  Family History  Problem Relation Age of Onset   Hyperlipidemia Mother    Diabetes Mother    Liver disease Mother        NASH   Other Mother        immune thrombocytopenic pupura (ITP)   Heart attack Father    Hyperlipidemia Father    Diabetes Father    Hypertension Father    Lymphoma Father    Hemochromatosis Other        Grandmother (? which side)   Polycythemia Other    Colon cancer Neg Hx    Esophageal cancer Neg Hx    Rectal cancer Neg Hx    Stomach cancer Neg Hx      Prior to Admission medications   Medication Sig Start Date End Date Taking? Authorizing Provider  acetaminophen (TYLENOL) 325 MG tablet Take 1 tablet (325 mg total) by mouth every 6 (six)  hours as needed for mild pain (pain score 1-3 or temp > 100.5). 09/06/17   Laliberte, Danielle, PA-C  aspirin (ASPIRIN CHILDRENS) 81 MG chewable tablet Chew 1 tablet (81 mg total) by mouth daily. 03/04/19   Lightfoot, Marinell Siad, MD  atorvastatin (LIPITOR) 80 MG tablet TAKE 1 TABLET BY MOUTH EVERY DAY Patient taking differently: Take 80 mg by mouth at bedtime. 12/01/19   Kent Pear, MD  azelastine (ASTELIN) 0.1 % nasal spray Place 2 sprays into both nostrils 2 (two) times daily. Use in each nostril as directed 09/16/22   Kent Pear, MD  brimonidine-timolol (COMBIGAN) 0.2-0.5 % ophthalmic solution Place 1 drop into both eyes every 12 (twelve) hours.    [provider]  Cholecalciferol (VITAMIN D) 50 MCG (2000 UT) CAPS Take 2,000 Units by mouth daily.    [provider]  Continuous Glucose Sensor (DEXCOM G7 SENSOR) MISC USE 1 DEVICE AS DIRECTED 09/03/22   Shamleffer, Ibtehal Jaralla, MD  dorzolamide (TRUSOPT) 2 % ophthalmic solution Place 1 drop into both eyes 2 (two) times daily. 10/18/19   [provider]  escitalopram (LEXAPRO) 10 MG tablet Take 10 mg by mouth daily. 04/26/23   [provider]  esomeprazole (NEXIUM) 20 MG capsule Take 20 mg by mouth daily.    [provider]  ezetimibe (ZETIA) 10 MG tablet Take 1 tablet (10 mg total) by mouth daily. 07/06/23   Thersia Flax, MD  FLOMAX 0.4 MG CAPS capsule 0.4 mg at bedtime. 04/03/21   [provider]  fluticasone (FLONASE) 50 MCG/ACT nasal spray SPRAY 2 SPRAYS INTO EACH NOSTRIL EVERY DAY 11/14/19   Kent Pear, MD  gabapentin (NEURONTIN) 300 MG capsule TAKE 1 CAPSULE BY MOUTH TWICE A DAY 02/10/23   Kent Pear, MD  gentamicin cream (GARAMYCIN) 0.1 % Apply 1 application topically every other day. Around port 04/05/21   [provider]  insulin degludec (TRESIBA FLEXTOUCH) 200 UNIT/ML FlexTouch Pen Inject 42 Units into the skin in the morning and at bedtime. 09/24/22    Shamleffer, Ibtehal Jaralla, MD  insulin lispro (HUMALOG KWIKPEN) 100 UNIT/ML KwikPen Max daily 45 units Patient taking differently: Max daily 45 units(Sliding scale) 09/18/22   Shamleffer, Ibtehal Jaralla, MD  Insulin NPH, Human,, Isophane, (HUMULIN N KWIKPEN) 100 UNIT/ML Kiwkpen Inject 70 Units into the skin daily in the afternoon. To be taken at the start of dialysis 09/24/22   Shamleffer, Ibtehal Jaralla, MD  Insulin Pen Needle 32G X 4 MM MISC Inject 1  Device into the skin 5 (five) times daily. Use to inject Lantus and Novolog up to 5 times daily 11/15/20   Shamleffer, Ibtehal Jaralla, MD  Insulin Syringe-Needle U-100 28G X 1/2" 1 ML MISC 1 Device by Does not apply route daily in the afternoon. 05/08/22   Shamleffer, Ibtehal Jaralla, MD  irbesartan (AVAPRO) 75 MG tablet Take 75 mg by mouth at bedtime. 04/08/22   [provider]  KLOR-CON M20 20 MEQ tablet Take 20 mEq by mouth daily.    [provider]  lactulose (CHRONULAC) 10 GM/15ML solution TAKE 30 MLS (20 G TOTAL) BY MOUTH AS NEEDED. 11/02/20   Pyrtle, Amber Bail, MD  latanoprost (XALATAN) 0.005 % ophthalmic solution Place 1 drop into both eyes 2 (two) times daily.    [provider]  metoCLOPramide (REGLAN) 10 MG tablet Take 1 tablet (10 mg total) by mouth at bedtime. 02/06/23   McMichael, Elba Greathouse, PA-C  midodrine (PROAMATINE) 5 MG tablet Take 5 mg by mouth 2 (two) times daily as needed (Below 100). 09/27/20   [provider]  mometasone (ELOCON) 0.1 % cream Apply 1 Application topically as directed. Qd to bid to aa right ankle, right lower leg until improved, then prn flares 03/30/23   Artemio Larry, MD  Multiple Vitamin (MULTIVITAMIN ADULT PO) Take 1 tablet by mouth daily.    [provider]  mupirocin ointment (BACTROBAN) 2 % Apply 1 Application topically daily. Every day to excision site on back 10/27/22   Artemio Larry, MD  ondansetron (ZOFRAN-ODT) 4 MG disintegrating tablet Take 4 mg by mouth every 8  (eight) hours as needed for nausea or vomiting.    [provider]  PARoxetine (PAXIL) 10 MG tablet Take 10 mg by mouth daily. 08/22/20 12/06/21  [provider]  senna (SENOKOT) 8.6 MG tablet Take 2 tablets by mouth daily.    [provider]  sertraline (ZOLOFT) 25 MG tablet Take 25 mg by mouth daily. 08/19/22   [provider]  Testosterone 20.25 MG/ACT (1.62%) GEL Place 2 Pump onto the skin daily in the afternoon. 05/15/23   Shamleffer, Ibtehal Jaralla, MD  torsemide (DEMADEX) 20 MG tablet Take 20-40 mg by mouth See admin instructions. Take 40 mg in the morning and 40 mg in the afternoon    [provider]    Physical Exam: Vitals:   07/26/23 1515 07/26/23 1630 07/26/23 1700 07/26/23 1706  BP:  (!) 191/97 (!) 178/102 (!) 178/101  Pulse: 68 67 65   Resp: 16 12 (!) 8   Temp:      TempSrc:      SpO2: 100% 100% 96%   Weight:      Height:         Constitutional:  Patient is obese.  Patient is deeply sleepy, with difficulty waking patient up.  Patient is snoring.   Eyes:  No jaundice.  ENMT:  external ears, nose appear normal Neck:  Neck is supple.  JVD is difficult to assess. Respiratory:  Decreased air entry globally.    Cardiovascular:  S1S2  Abdomen:  Abdomen is obese, soft and nontender.  PD catheter site looks clean.    Neurologic:  Deeply asleep at the moment.   Extremities: Status post left BKA. Mild edema over lower extremities.  Wt Readings from Last 3 Encounters:  07/26/23 115.7 kg  05/15/23 115.7 kg  10/06/22 121.6 kg    I have personally reviewed following labs and imaging studies  Labs on Admission:  CBC:  Recent Labs  Lab 07/26/23 1150 07/26/23 1205  WBC 8.8  --   NEUTROABS 6.8  --   HGB 12.2* 10.5*  HCT 36.7* 31.0*  MCV 103.7*  --   PLT 156  --    Basic Metabolic Panel: Recent Labs  Lab 07/26/23 1150 07/26/23 1205  NA 135 132*  K 5.0 8.2*  CL 99  --   CO2 22  --   GLUCOSE 83  --   BUN  48*  --   CREATININE 7.92*  --   CALCIUM 8.6*  --    Liver Function Tests: Recent Labs  Lab 07/26/23 1150  AST 42*  ALT 23  ALKPHOS 97  BILITOT 0.7  PROT 6.4*  ALBUMIN 2.6*   Recent Labs  Lab 07/26/23 1150  LIPASE 39   Recent Labs  Lab 07/26/23 1150  AMMONIA 30   Coagulation Profile: Recent Labs  Lab 07/26/23 1150  INR 1.2   Cardiac Enzymes: No results for input(s): "CKTOTAL", "CKMB", "CKMBINDEX", "TROPONINI" in the last 168 hours. BNP (last 3 results) No results for input(s): "PROBNP" in the last 8760 hours. HbA1C: No results for input(s): "HGBA1C" in the last 72 hours. CBG: Recent Labs  Lab 07/26/23 1138 07/26/23 1354 07/26/23 1458 07/26/23 1625 07/26/23 1715  GLUCAP 73 65* 127* 81 68*   Lipid Profile: No results for input(s): "CHOL", "HDL", "LDLCALC", "TRIG", "CHOLHDL", "LDLDIRECT" in the last 72 hours. Thyroid Function Tests: No results for input(s): "TSH", "T4TOTAL", "FREET4", "T3FREE", "THYROIDAB" in the last 72 hours. Anemia Panel: No results for input(s): "VITAMINB12", "FOLATE", "FERRITIN", "TIBC", "IRON", "RETICCTPCT" in the last 72 hours. Urine analysis:    Component Value Date/Time   COLORURINE YELLOW 07/26/2023 1520   APPEARANCEUR CLEAR 07/26/2023 1520   LABSPEC 1.016 07/26/2023 1520   PHURINE 6.0 07/26/2023 1520   GLUCOSEU 150 (A) 07/26/2023 1520   HGBUR NEGATIVE 07/26/2023 1520   BILIRUBINUR NEGATIVE 07/26/2023 1520   KETONESUR NEGATIVE 07/26/2023 1520   PROTEINUR >=300 (A) 07/26/2023 1520   UROBILINOGEN 0.2 10/22/2006 0948   NITRITE NEGATIVE 07/26/2023 1520   LEUKOCYTESUR NEGATIVE 07/26/2023 1520   Sepsis Labs: @LABRCNTIP (procalcitonin:4,lacticidven:4) )No results found for this or any previous visit (from the past 240 hours).    Radiological Exams on Admission: MR BRAIN WO CONTRAST Result Date: 07/26/2023 CLINICAL DATA:  Mental status change, unknown cause. EXAM: MRI HEAD WITHOUT CONTRAST TECHNIQUE: Multiplanar, multiecho  pulse sequences of the brain and surrounding structures were obtained without intravenous contrast. COMPARISON:  Head CT 07/26/2023 and MRI 10/22/2006 FINDINGS: Brain: There is no evidence of an acute infarct, intracranial hemorrhage, mass, midline shift, or extra-axial fluid collection. Chronic bilateral thalamic lacunar infarcts are new from the remote MRI. There are a few remote small-vessel insults in the cerebral white matter as well. There is mild cerebral atrophy. Vascular: Major intracranial vascular flow voids are preserved. Skull and upper cervical spine: Unremarkable bone marrow signal. Sinuses/Orbits: Chronic bilateral sphenoid, ethmoid, and right maxillary sinusitis with prominent T2 hypointensity in the left sphenoid and right maxillary sinuses potentially reflecting allergic fungal sinusitis. Clear mastoid air cells. Bilateral cataract extraction. Other: None. IMPRESSION: 1. No acute intracranial abnormality. 2. 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 3. Chronic sinusitis. Electronically Signed   By: Aundra Lee M.D.   On: 07/26/2023 16:52   CT HEAD WO CONTRAST ( ) Result Date: 07/26/2023 CLINICAL DATA:  Mental status change, unknown cause; Neck trauma, intoxicated or obtunded (Age >= 16y) EXAM: CT HEAD WITHOUT CONTRAST CT CERVICAL SPINE  WITHOUT CONTRAST TECHNIQUE: Multidetector CT imaging of the head and cervical spine was performed following the standard protocol without intravenous contrast. Multiplanar CT image reconstructions of the cervical spine were also generated. RADIATION DOSE REDUCTION: This exam was performed according to the departmental dose-optimization program which includes automated exposure control, adjustment of the mA and/or kV according to patient size and/or use of iterative reconstruction technique. COMPARISON:  None Available. FINDINGS: CT HEAD FINDINGS Brain: No evidence of large-territorial acute infarction. No parenchymal hemorrhage. No  mass lesion. No extra-axial collection. No mass effect or midline shift. No hydrocephalus. Basilar cisterns are patent. Vascular: No hyperdense vessel. Atherosclerotic calcifications are present within the cavernous internal carotid and vertebral arteries. Skull: No acute fracture or focal lesion. Sinuses/Orbits: Complete opacification of the bilateral sphenoid sinuses. Almost complete opacification of the right maxillary and bilateral ethmoid sinuses. Curvilinear calcification of the right maxillary sinus suggestive of chronic versus fungal infection. Otherwise remaining paranasal sinuses and mastoid air cells are clear. Bilateral lens replacement. Otherwise the orbits are unremarkable. Other: None. CT CERVICAL SPINE FINDINGS Alignment: Normal. Skull base and vertebrae: C5-C6 anterior cervical discectomy and fusion. Multilevel moderate degenerative changes of the spine. No associated severe osseous neural foraminal or central canal stenosis. No acute fracture. No aggressive appearing focal osseous lesion or focal pathologic process. Soft tissues and spinal canal: No prevertebral fluid or swelling. No visible canal hematoma. Upper chest: Unremarkable. Other: Atherosclerotic plaque of the carotid arteries within the neck. IMPRESSION: 1. No acute intracranial abnormality. 2. No acute displaced fracture or traumatic listhesis of the cervical spine. 3. Sinus disease as above. Electronically Signed   By: Morgane  Naveau M.D.   On: 07/26/2023 14:22   CT Cervical Spine Wo Contrast Result Date: 07/26/2023 CLINICAL DATA:  Mental status change, unknown cause; Neck trauma, intoxicated or obtunded (Age >= 16y) EXAM: CT HEAD WITHOUT CONTRAST CT CERVICAL SPINE WITHOUT CONTRAST TECHNIQUE: Multidetector CT imaging of the head and cervical spine was performed following the standard protocol without intravenous contrast. Multiplanar CT image reconstructions of the cervical spine were also generated. RADIATION DOSE REDUCTION: This  exam was performed according to the departmental dose-optimization program which includes automated exposure control, adjustment of the mA and/or kV according to patient size and/or use of iterative reconstruction technique. COMPARISON:  None Available. FINDINGS: CT HEAD FINDINGS Brain: No evidence of large-territorial acute infarction. No parenchymal hemorrhage. No mass lesion. No extra-axial collection. No mass effect or midline shift. No hydrocephalus. Basilar cisterns are patent. Vascular: No hyperdense vessel. Atherosclerotic calcifications are present within the cavernous internal carotid and vertebral arteries. Skull: No acute fracture or focal lesion. Sinuses/Orbits: Complete opacification of the bilateral sphenoid sinuses. Almost complete opacification of the right maxillary and bilateral ethmoid sinuses. Curvilinear calcification of the right maxillary sinus suggestive of chronic versus fungal infection. Otherwise remaining paranasal sinuses and mastoid air cells are clear. Bilateral lens replacement. Otherwise the orbits are unremarkable. Other: None. CT CERVICAL SPINE FINDINGS Alignment: Normal. Skull base and vertebrae: C5-C6 anterior cervical discectomy and fusion. Multilevel moderate degenerative changes of the spine. No associated severe osseous neural foraminal or central canal stenosis. No acute fracture. No aggressive appearing focal osseous lesion or focal pathologic process. Soft tissues and spinal canal: No prevertebral fluid or swelling. No visible canal hematoma. Upper chest: Unremarkable. Other: Atherosclerotic plaque of the carotid arteries within the neck. IMPRESSION: 1. No acute intracranial abnormality. 2. No acute displaced fracture or traumatic listhesis of the cervical spine. 3. Sinus disease as above. Electronically Signed  By: Morgane  Naveau M.D.   On: 07/26/2023 14:22   DG Chest Port 1 View Result Date: 07/26/2023 CLINICAL DATA:  Altered mental status. EXAM: PORTABLE CHEST 1  VIEW COMPARISON:  08/10/2020 FINDINGS: The cardio pericardial silhouette is enlarged. Low lung volumes. The lungs are clear without focal pneumonia, edema, pneumothorax or pleural effusion. No acute bony abnormality. Telemetry leads overlie the chest. IMPRESSION: Low volume film without acute cardiopulmonary findings. Electronically Signed   By: Donnal Fusi M.D.   On: 07/26/2023 11:29    EKG: Independently reviewed.   Principal Problem:   Acute encephalopathy   Assessment/Plan Acute encephalopathy: -Etiology unclear. -Will admit patient for further assessment and management. -Episode of behavioral confusion was said to have started last night. -Patient moves all extremities. -CT and MRI head have not revealed any acute abnormalities. -Systolic blood pressure of 202 mmHg on presentation, but has responded to IV hydralazine x 1 dose. -No fever or chills -Patient has been noted to be hypoglycemic. -No history of findings suggestive of infective process. -Stat ABG. -Ammonia level is 30. -UDS. -Check alcohol level. -Likely undiagnosed OSA.  Will consult respiratory therapy team for CPAP versus BiPAP during the hospital stay. -Further management depend on hospital course. -Nephrology input is appreciated.  Patient is on peritoneal dialysis.  Hypertensive urgency/accelerated hypertension: -On presentation, systolic blood pressure was 202. -With 1 dose of IV hydralazine, systolic blood pressure has dropped to 170 mmHg. -Continue to monitor blood pressure closely. -MRI brain is negative for acute process.  ESRD on peritoneal dialysis: -Continue peritoneal dialysis during the hospital stay.  Likely undiagnosed OSA: -CPAP versus BiPAP during the hospital stay. -Follow ABG. -Patient will benefit from sleep studies on discharge.  Hypoglycemia: -D10 drip at 50 cc/h. -Monitor blood sugar closely.  Morbid obesity: -Diet and exercise (on discharge).  Anemia with MCV of 103: -Check  B12 and folate level. -Check alcohol level.  Fatty liver: -Consider referring patient to GI on discharge.  Coronary artery disease: -History of CABG. -Stable.  PVD: -Status post left BKA.  DVT prophylaxis: Subcutaneous heparin Code Status: Full code Family Communication: Wife and daughter.   Disposition Plan: Inpatient Consults called: Nephrology Admission status: Inpatient.    Time spent: 85 minutes  Chase Iba, MD  [facility] Pager #: [phone] 7PM-7AM contact night coverage as above  07/26/2023, 5:50 PM

## 2023-07-27 ENCOUNTER — Ambulatory Visit: Admitting: Student

## 2023-07-27 DIAGNOSIS — G9340 Encephalopathy, unspecified: Secondary | ICD-10-CM | POA: Diagnosis not present

## 2023-07-27 LAB — CBC
HCT: 31.8 % — ABNORMAL LOW (ref 39.0–52.0)
Hemoglobin: 10.4 g/dL — ABNORMAL LOW (ref 13.0–17.0)
MCH: 33.5 pg (ref 26.0–34.0)
MCHC: 32.7 g/dL (ref 30.0–36.0)
MCV: 102.6 fL — ABNORMAL HIGH (ref 80.0–100.0)
Platelets: 174 10*3/uL (ref 150–400)
RBC: 3.1 MIL/uL — ABNORMAL LOW (ref 4.22–5.81)
RDW: 14.6 % (ref 11.5–15.5)
WBC: 7.8 10*3/uL (ref 4.0–10.5)
nRBC: 0 % (ref 0.0–0.2)

## 2023-07-27 LAB — RENAL FUNCTION PANEL
Albumin: 2.3 g/dL — ABNORMAL LOW (ref 3.5–5.0)
Anion gap: 13 (ref 5–15)
BUN: 49 mg/dL — ABNORMAL HIGH (ref 6–20)
CO2: 21 mmol/L — ABNORMAL LOW (ref 22–32)
Calcium: 8.4 mg/dL — ABNORMAL LOW (ref 8.9–10.3)
Chloride: 102 mmol/L (ref 98–111)
Creatinine, Ser: 8.32 mg/dL — ABNORMAL HIGH (ref 0.61–1.24)
GFR, Estimated: 7 mL/min — ABNORMAL LOW (ref >60)
Glucose, Bld: 221 mg/dL — ABNORMAL HIGH (ref 70–99)
Phosphorus: 6.6 mg/dL — ABNORMAL HIGH (ref 2.5–4.6)
Potassium: 4.1 mmol/L (ref 3.5–5.1)
Sodium: 136 mmol/L (ref 135–145)

## 2023-07-27 LAB — GLUCOSE, CAPILLARY
Glucose-Capillary: 168 mg/dL — ABNORMAL HIGH (ref 70–99)
Glucose-Capillary: 263 mg/dL — ABNORMAL HIGH (ref 70–99)
Glucose-Capillary: 254 mg/dL — ABNORMAL HIGH (ref 70–99)
Glucose-Capillary: 210 mg/dL — ABNORMAL HIGH (ref 70–99)
Glucose-Capillary: 150 mg/dL — ABNORMAL HIGH (ref 70–99)
Glucose-Capillary: 191 mg/dL — ABNORMAL HIGH (ref 70–99)

## 2023-07-27 MED ORDER — TORSEMIDE 20 MG PO TABS
40.0000 mg | ORAL_TABLET | ORAL | Status: DC
Start: 2023-07-27 — End: 2023-07-30
  Administered 2023-07-27 – 2023-07-30 (×7): 40 mg via ORAL
  Filled 2023-07-27 (×8): qty 2

## 2023-07-27 MED ORDER — DELFLEX-LC/2.5% DEXTROSE 394 MOSM/L IP SOLN: INTRAPERITONEAL | Status: DC

## 2023-07-27 MED ORDER — AMLODIPINE BESYLATE 10 MG PO TABS
10.0000 mg | ORAL_TABLET | Freq: Every day | ORAL | Status: DC
  Administered 2023-07-27 – 2023-07-30 (×4): 10 mg via ORAL
  Filled 2023-07-27 (×4): qty 1

## 2023-07-27 MED ORDER — TAMSULOSIN HCL 0.4 MG PO CAPS
0.4000 mg | ORAL_CAPSULE | Freq: Every day | ORAL | Status: DC
  Administered 2023-07-27 – 2023-07-30 (×4): 0.4 mg via ORAL
  Filled 2023-07-27 (×5): qty 1

## 2023-07-27 MED ORDER — HYDRALAZINE HCL 20 MG/ML IJ SOLN
10.0000 mg | INTRAMUSCULAR | Status: DC | PRN
  Administered 2023-07-27: 10 mg via INTRAVENOUS

## 2023-07-27 MED ORDER — HEPARIN SODIUM (PORCINE) 1000 UNIT/ML IJ SOLN
INTRAPERITONEAL | Status: DC | PRN
  Filled 2023-07-27 (×2): qty 6000

## 2023-07-27 MED ORDER — DIPHENHYDRAMINE HCL 50 MG/ML IJ SOLN
12.5000 mg | Freq: Once | INTRAMUSCULAR | Status: AC
  Administered 2023-07-27: 12.5 mg via INTRAVENOUS
  Filled 2023-07-27: qty 1

## 2023-07-27 NOTE — Progress Notes (Signed)
   07/26/23 2230  Peritoneal Catheter Left lower abdomen Continuous ambulatory  Placement Date/Time: 08/30/20 2956   Procedural Verification: Site marked with initials  Time out: Correct Patient;Correct Site;Correct Procedure  Person Inserting LDA: Dr. Mauri Sous  Catheter Location: Left lower abdomen  Catheter Conversion: Extension...  Site Assessment Clean, Dry, Intact  Drainage Description None  Catheter status Accessed  Dressing Gauze/Drain sponge  Dressing Status Clean, Dry, Intact  Dressing Intervention New dressing  Completion  Treatment Status Started  Hand-off documentation  Hand-off Given Given to shift RN/LPN  Report given to (Full Name) Suzan Erm, RN  Hand-off Received Received from shift RN/LPN  Report received from (Full Name) Lucas Rushing, RN

## 2023-07-27 NOTE — Progress Notes (Signed)
 Notified Dr. [facility] Lavender that pt has not voided overnight. Got order for in and out cath. Got 500 ml out.

## 2023-07-27 NOTE — Progress Notes (Signed)
 [facility] Progress Note  Subjective:  Seen in room Family told RN he is less lethargic, and said a few words Still confused   Vitals:   07/27/23 0110 07/27/23 0517 07/27/23 0745 07/27/23 1200  BP: (!) 141/76 (!) 140/85 (!) 146/74 (!) 170/88  Pulse: 74 69 73 74  Resp: 13 10 (!) 8 13  Temp: (!) 97.3 F (36.3 C) (!) 97 F (36.1 C) 98.1 F (36.7 C) 97.6 F (36.4 C)  TempSrc: Axillary Axillary Axillary Axillary  SpO2: 98% 100% 99% 98%  Weight:  113.7 kg    Height:        Exam:  lethargic, and confused    no jvd  Chest cta bilat  Cor reg no RG  Abd soft ntnd no ascites   Ext L BKA, trace edema R LE   Neuro: awakens to voice, confused, Ox 0   PD cath in place    OP PD: CCPD, 5 cycles, 2.4 L (12 L overnight) over 9 hours with Extraneal during the day.  7000 units heparin per 6 L dialysate bag.     Assessment/ Plan: Altered mental status: CT negative, was taking baclofen at home after a fall (baclofen causes CNS issues in esrd pt). A little better today per family. Follow.  End-stage renal disease: cont peritoneal dialysis per outpatient prescription, hold daytime icodextrin while here.  Hypertension: sig high BP's, 4.5% PD solution used for UF/blood pressure management. Anemia: Hemoglobin and hematocrit currently at goal, no indication for ESA therapy. Secondary hyperparathyroidism: Trend calcium and phosphorus levels and restart phosphorus binder when mental status allows for adequate oral intake.  Larry Poag MD  [facility] 07/27/2023, 12:32 PM  Recent Labs  Lab 07/26/23 1150 07/26/23 1205 07/26/23 1842 07/26/23 2025 07/27/23 0329  HGB 12.2*   < > 13.6 14.5 10.4*  ALBUMIN 2.6*  --   --   --  2.3*  CALCIUM 8.6*  --   --   --  8.4*  PHOS  --   --   --  6.1* 6.6*  CREATININE 7.92*  --   --  8.21* 8.32*  K 5.0   < > 4.2  --  4.1   < > = values in this interval not displayed.   No results for input(s): "IRON", "TIBC", "FERRITIN" in the last 168  hours. Inpatient medications:  aspirin  81 mg Oral Daily   atorvastatin  80 mg Oral QHS   ezetimibe  10 mg Oral Daily   gentamicin cream  1 Application Topical Daily   heparin  5,000 Units Subcutaneous Q8H   insulin aspart  0-6 Units Subcutaneous Q4H   irbesartan  75 mg Oral QHS   metoCLOPramide  10 mg Oral QHS   pantoprazole (PROTONIX) IV  40 mg Intravenous Q12H   senna  2 tablet Oral Daily    dextrose 50 mL/hr at 07/26/23 2035   dialysis solution 2.5% low-MG/low-CA     dialysis solution 4.25% low-MG/low-CA     acetaminophen, heparin sodium (porcine) 3,000 Units in dialysis solution 1.5% low-MG/low-CA 2,000 mL dialysis solution, heparin sodium (porcine) 7,000 Units in dialysis solution 2.5% low-MG/low-CA 6,000 mL dialysis solution, heparin sodium (porcine) 7,000 Units in dialysis solution 4.25% low-MG/low-CA 6,000 mL dialysis solution, hydrALAZINE

## 2023-07-27 NOTE — Progress Notes (Signed)
   07/27/23 1045  Peritoneal Catheter Left lower abdomen Continuous ambulatory  Placement Date/Time: 08/30/20 4098   Procedural Verification: Site marked with initials  Time out: Correct Patient;Correct Site;Correct Procedure  Person Inserting LDA: Dr. Mauri Sous  Catheter Location: Left lower abdomen  Catheter Conversion: Extension...  Site Assessment Clean, Dry, Intact  Drainage Description None  Catheter status Deaccessed;Clamped;Capped  Dressing Gauze/Drain sponge  Dressing Status Clean, Dry, Intact  Completion  Treatment Status Complete  Initial Drain Volume 1510  Average Dwell Time-Hour(s) 1  Average Dwell Time-Min(s) 30  Average Drain Time 35  Total Therapy Volume 11914  Total Therapy Time-Hour(s) 11  Total Therapy Time-Min(s) 51  Effluent Appearance Clear;Yellow  Fluid Balance - CCPD  Total UF (+ value on cycler, pt loss) 2518 mL  Procedure Comments  Tolerated treatment well? Yes     PD treatment completed. Patient tolerated treatment well. PD effluent is clear. No specimen collected.  PD exit site clean, dry and intact. Patient is awake, oriented and in no acute distress.  Report given to bedside nurse.

## 2023-07-27 NOTE — Progress Notes (Signed)
 PROGRESS NOTE    Chase Clark  ZOX:[id number] DOB: 1966/01/15 DOA: 07/26/2023 PCP: Glori Luis, MD (Inactive)  Outpatient Specialists:     Brief Narrative:   Patient is a 58 year old male, obese, with past medical history significant for end-stage renal disease on peritoneal dialysis, hypertension, diabetes mellitus, legally blind, PVD, history of CABG and left BKA, amongst multiple medical and cardiac problems.  Patient was admitted with encephalopathy and hypertensive urgency/accelerated hypertension.  Negative CT and MRI head.  No findings suggestive of likely infection.  Hypoglycemia noted.  Patient was initially on D10 at 50 cc/h.  D10 has been discontinued.  Encephalopathy seems to be improving.  Intermittent elevation of blood pressure noted.  07/27/2023: Patient seen alongside patient's daughter.  Patient's daughter tells me patient is not at baseline.  Will continue to monitor blood sugar.  Proceed with calorie count.  Continue to assess patient.   Assessment & Plan:   Principal Problem:   Acute encephalopathy   Acute encephalopathy: - Workup in progress. - Hypoglycemia and intermittently accelerated blood pressure noted. - A1c of 4.9%. - Consult diabetes coordinator. -Calorie count.  Assess insulin requirement.  Patient has ESRD. -CT and MRI head have not revealed any acute abnormalities. -No fever or chills -No history of findings suggestive of infective process. - ABG revealed pH of 7.315, PCO2 of 50.8 and PO2 of 122.   -Ammonia level is 30. -UDS was negative. - Alcohol level of less than 10.   -Likely undiagnosed OSA/obesity hypoventilation syndrome.  CPAP at nighttime.  Formal workup on discharge.  Follow-up with pulmonary team on discharge. -Further management depend on hospital course. -Nephrology input is appreciated.  Patient is on peritoneal dialysis.   Hypertensive urgency/accelerated hypertension: -On presentation, systolic blood pressure was  202. -With 1 dose of IV hydralazine, systolic blood pressure has dropped to 170 mmHg. -Continue to monitor blood pressure closely. -MRI brain is negative for acute process. 07/27/2023: Blood pressure of 223/101 noted this afternoon.  Add amlodipine 10 Mg p.o. once daily.  Resume torsemide 40 Mg twice daily.  Continue irbesartan 75 Mg p.o. nightly.  Have a low threshold to add Coreg.  Gradually optimize blood pressure. - Goal blood pressure should be less than 130/80 mmHg.   ESRD on peritoneal dialysis: -Continue peritoneal dialysis during the hospital stay.   Likely undiagnosed OSA/at bedtime: -CPAP versus BiPAP during the hospital stay. - BG result noted.   -Patient will benefit from sleep studies on discharge. -Follow-up with pulmonary team on discharge.     Hypoglycemia: -D10 drip at 50 cc/h. -Monitor blood sugar closely.   Morbid obesity: -Diet and exercise (on discharge).   Anemia with MCV of 103: - B12 is 676. - Folate is 14.5.  Check B12 and folate level.   Fatty liver: -Consider referring patient to GI on discharge.   Coronary artery disease: -History of CABG. -Stable.   PVD: -Status post left BKA.     DVT prophylaxis: Subcutaneous heparin Code Status: Full code Family Communication: Daughter Disposition Plan: Inpatient   Consultants:  Nephrology  Procedures:  Patient is on peritoneal dialysis  Antimicrobials:  None.   Subjective: No new complaints. No significant history from patient.  Objective: Vitals:   07/27/23 0110 07/27/23 0517 07/27/23 0745 07/27/23 1200  BP: (!) 141/76 (!) 140/85 (!) 146/74 (!) 170/88  Pulse: 74 69 73 74  Resp: 13 10 (!) 8 13  Temp: (!) 97.3 F (36.3 C) (!) 97 F (36.1 C) 98.1 F (36.7 C) 97.6  F (36.4 C)  TempSrc: Axillary Axillary Axillary Axillary  SpO2: 98% 100% 99% 98%  Weight:  113.7 kg    Height:        Intake/Output Summary (Last 24 hours) at 07/27/2023 1412 Last data filed at 07/27/2023 1208 Gross  per 24 hour  Intake 417 ml  Output 3918 ml  Net -3501 ml   Filed Weights   07/26/23 1059 07/27/23 0517  Weight: 115.7 kg 113.7 kg    Examination:  General exam: Patient is obese.  Patient is more awake today.  Not in any distress.  Respiratory system: Decreased air entry.   Cardiovascular system: S1 & S2  Gastrointestinal system: Abdomen is obese, soft and nontender.  Central nervous system: More awake today.  Moves all extremities.   Extremities: S/p left BKA.  Mild lower leg edema on the right side.  Data Reviewed: I have personally reviewed following labs and imaging studies  CBC: Recent Labs  Lab 07/26/23 1150 07/26/23 1205 07/26/23 1842 07/26/23 2025 07/27/23 0329  WBC 8.8  --   --  5.3 7.8  NEUTROABS 6.8  --   --   --   --   HGB 12.2* 10.5* 13.6 14.5 10.4*  HCT 36.7* 31.0* 40.0 45.2 31.8*  MCV 103.7*  --   --  102.7* 102.6*  PLT 156  --   --  125* 174   Basic Metabolic Panel: Recent Labs  Lab 07/26/23 1150 07/26/23 1205 07/26/23 1842 07/26/23 2025 07/27/23 0329  NA 135 132* 137  --  136  K 5.0 8.2* 4.2  --  4.1  CL 99  --   --   --  102  CO2 22  --   --   --  21*  GLUCOSE 83  --   --   --  221*  BUN 48*  --   --   --  49*  CREATININE 7.92*  --   --  8.21* 8.32*  CALCIUM 8.6*  --   --   --  8.4*  MG  --   --   --  1.6*  --   PHOS  --   --   --  6.1* 6.6*   GFR: Estimated Creatinine Clearance: 12.1 mL/min (A) (by C-G formula based on SCr of 8.32 mg/dL (H)). Liver Function Tests: Recent Labs  Lab 07/26/23 1150 07/27/23 0329  AST 42*  --   ALT 23  --   ALKPHOS 97  --   BILITOT 0.7  --   PROT 6.4*  --   ALBUMIN 2.6* 2.3*   Recent Labs  Lab 07/26/23 1150  LIPASE 39   Recent Labs  Lab 07/26/23 1150  AMMONIA 30   Coagulation Profile: Recent Labs  Lab 07/26/23 1150  INR 1.2   Cardiac Enzymes: No results for input(s): "CKTOTAL", "CKMB", "CKMBINDEX", "TROPONINI" in the last 168 hours. BNP (last 3 results) No results for input(s):  "PROBNP" in the last 8760 hours. HbA1C: Recent Labs    07/26/23 2025  HGBA1C 4.9   CBG: Recent Labs  Lab 07/26/23 2022 07/27/23 0114 07/27/23 0530 07/27/23 0824 07/27/23 1155  GLUCAP 83 168* 263* 254* 210*   Lipid Profile: No results for input(s): "CHOL", "HDL", "LDLCALC", "TRIG", "CHOLHDL", "LDLDIRECT" in the last 72 hours. Thyroid Function Tests: No results for input(s): "TSH", "T4TOTAL", "FREET4", "T3FREE", "THYROIDAB" in the last 72 hours. Anemia Panel: Recent Labs    07/26/23 2025  VITAMINB12 676  FOLATE 14.5   Urine analysis:  Component Value Date/Time   COLORURINE YELLOW 07/26/2023 1520   APPEARANCEUR CLEAR 07/26/2023 1520   LABSPEC 1.016 07/26/2023 1520   PHURINE 6.0 07/26/2023 1520   GLUCOSEU 150 (A) 07/26/2023 1520   HGBUR NEGATIVE 07/26/2023 1520   BILIRUBINUR NEGATIVE 07/26/2023 1520   KETONESUR NEGATIVE 07/26/2023 1520   PROTEINUR >=300 (A) 07/26/2023 1520   UROBILINOGEN 0.2 10/22/2006 0948   NITRITE NEGATIVE 07/26/2023 1520   LEUKOCYTESUR NEGATIVE 07/26/2023 1520   Sepsis Labs: @LABRCNTIP (procalcitonin:4,lacticidven:4)  ) Recent Results (from the past 240 hours)  Blood Culture (routine x 2)     Status: None (Preliminary result)   Collection Time: 07/26/23 11:45 AM   Specimen: BLOOD RIGHT HAND  Result Value Ref Range Status   Specimen Description BLOOD RIGHT HAND  Final   Special Requests   Final    BOTTLES DRAWN AEROBIC AND ANAEROBIC Blood Culture results may not be optimal due to an inadequate volume of blood received in culture bottles   Culture   Final    NO GROWTH < 24 HOURS Performed at [facility], [address], [place], [place] [address]    Report Status PENDING  Incomplete  Blood Culture (routine x 2)     Status: None (Preliminary result)   Collection Time: 07/26/23 11:50 AM   Specimen: BLOOD LEFT HAND  Result Value Ref Range Status   Specimen Description BLOOD LEFT HAND  Final   Special Requests   Final    BOTTLES  DRAWN AEROBIC AND ANAEROBIC Blood Culture results may not be optimal due to an inadequate volume of blood received in culture bottles   Culture   Final    NO GROWTH < 24 HOURS Performed at [facility], [address] [place], [place] [address]    Report Status PENDING  Incomplete         Radiology Studies: MR BRAIN WO CONTRAST Result Date: 07/26/2023 CLINICAL DATA:  Mental status change, unknown cause. EXAM: MRI HEAD WITHOUT CONTRAST TECHNIQUE: Multiplanar, multiecho pulse sequences of the brain and surrounding structures were obtained without intravenous contrast. COMPARISON:  Head CT 07/26/2023 and MRI 10/22/2006 FINDINGS: Brain: There is no evidence of an acute infarct, intracranial hemorrhage, mass, midline shift, or extra-axial fluid collection. Chronic bilateral thalamic lacunar infarcts are new from the remote MRI. There are a few remote small-vessel insults in the cerebral white matter as well. There is mild cerebral atrophy. Vascular: Major intracranial vascular flow voids are preserved. Skull and upper cervical spine: Unremarkable bone marrow signal. Sinuses/Orbits: Chronic bilateral sphenoid, ethmoid, and right maxillary sinusitis with prominent T2 hypointensity in the left sphenoid and right maxillary sinuses potentially reflecting allergic fungal sinusitis. Clear mastoid air cells. Bilateral cataract extraction. Other: None. IMPRESSION: 1. No acute intracranial abnormality. 2. 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 3. Chronic sinusitis. Electronically Signed   By: Sebastian Ache M.D.   On: 07/26/2023 16:52   CT HEAD WO CONTRAST ( ) Result Date: 07/26/2023 CLINICAL DATA:  Mental status change, unknown cause; Neck trauma, intoxicated or obtunded (Age >= 16y) EXAM: CT HEAD WITHOUT CONTRAST CT CERVICAL SPINE WITHOUT CONTRAST TECHNIQUE: Multidetector CT imaging of the head and cervical spine was performed following the standard protocol without  intravenous contrast. Multiplanar CT image reconstructions of the cervical spine were also generated. RADIATION DOSE REDUCTION: This exam was performed according to the departmental dose-optimization program which includes automated exposure control, adjustment of the mA and/or kV according to patient size and/or use of iterative reconstruction  technique. COMPARISON:  None Available. FINDINGS: CT HEAD FINDINGS Brain: No evidence of large-territorial acute infarction. No parenchymal hemorrhage. No mass lesion. No extra-axial collection. No mass effect or midline shift. No hydrocephalus. Basilar cisterns are patent. Vascular: No hyperdense vessel. Atherosclerotic calcifications are present within the cavernous internal carotid and vertebral arteries. Skull: No acute fracture or focal lesion. Sinuses/Orbits: Complete opacification of the bilateral sphenoid sinuses. Almost complete opacification of the right maxillary and bilateral ethmoid sinuses. Curvilinear calcification of the right maxillary sinus suggestive of chronic versus fungal infection. Otherwise remaining paranasal sinuses and mastoid air cells are clear. Bilateral lens replacement. Otherwise the orbits are unremarkable. Other: None. CT CERVICAL SPINE FINDINGS Alignment: Normal. Skull base and vertebrae: C5-C6 anterior cervical discectomy and fusion. Multilevel moderate degenerative changes of the spine. No associated severe osseous neural foraminal or central canal stenosis. No acute fracture. No aggressive appearing focal osseous lesion or focal pathologic process. Soft tissues and spinal canal: No prevertebral fluid or swelling. No visible canal hematoma. Upper chest: Unremarkable. Other: Atherosclerotic plaque of the carotid arteries within the neck. IMPRESSION: 1. No acute intracranial abnormality. 2. No acute displaced fracture or traumatic listhesis of the cervical spine. 3. Sinus disease as above. Electronically Signed   By: Morgane  Naveau M.D.    On: 07/26/2023 14:22   CT Cervical Spine Wo Contrast Result Date: 07/26/2023 CLINICAL DATA:  Mental status change, unknown cause; Neck trauma, intoxicated or obtunded (Age >= 16y) EXAM: CT HEAD WITHOUT CONTRAST CT CERVICAL SPINE WITHOUT CONTRAST TECHNIQUE: Multidetector CT imaging of the head and cervical spine was performed following the standard protocol without intravenous contrast. Multiplanar CT image reconstructions of the cervical spine were also generated. RADIATION DOSE REDUCTION: This exam was performed according to the departmental dose-optimization program which includes automated exposure control, adjustment of the mA and/or kV according to patient size and/or use of iterative reconstruction technique. COMPARISON:  None Available. FINDINGS: CT HEAD FINDINGS Brain: No evidence of large-territorial acute infarction. No parenchymal hemorrhage. No mass lesion. No extra-axial collection. No mass effect or midline shift. No hydrocephalus. Basilar cisterns are patent. Vascular: No hyperdense vessel. Atherosclerotic calcifications are present within the cavernous internal carotid and vertebral arteries. Skull: No acute fracture or focal lesion. Sinuses/Orbits: Complete opacification of the bilateral sphenoid sinuses. Almost complete opacification of the right maxillary and bilateral ethmoid sinuses. Curvilinear calcification of the right maxillary sinus suggestive of chronic versus fungal infection. Otherwise remaining paranasal sinuses and mastoid air cells are clear. Bilateral lens replacement. Otherwise the orbits are unremarkable. Other: None. CT CERVICAL SPINE FINDINGS Alignment: Normal. Skull base and vertebrae: C5-C6 anterior cervical discectomy and fusion. Multilevel moderate degenerative changes of the spine. No associated severe osseous neural foraminal or central canal stenosis. No acute fracture. No aggressive appearing focal osseous lesion or focal pathologic process. Soft tissues and spinal  canal: No prevertebral fluid or swelling. No visible canal hematoma. Upper chest: Unremarkable. Other: Atherosclerotic plaque of the carotid arteries within the neck. IMPRESSION: 1. No acute intracranial abnormality. 2. No acute displaced fracture or traumatic listhesis of the cervical spine. 3. Sinus disease as above. Electronically Signed   By: Morgane  Naveau M.D.   On: 07/26/2023 14:22   DG Chest Port 1 View Result Date: 07/26/2023 CLINICAL DATA:  Altered mental status. EXAM: PORTABLE CHEST 1 VIEW COMPARISON:  08/10/2020 FINDINGS: The cardio pericardial silhouette is enlarged. Low lung volumes. The lungs are clear without focal pneumonia, edema, pneumothorax or pleural effusion. No acute bony abnormality. Telemetry leads overlie the chest.  IMPRESSION: Low volume film without acute cardiopulmonary findings. Electronically Signed   By: Donnal Fusi M.D.   On: 07/26/2023 11:29        Scheduled Meds:  aspirin  81 mg Oral Daily   atorvastatin  80 mg Oral QHS   ezetimibe  10 mg Oral Daily   gentamicin cream  1 Application Topical Daily   heparin  5,000 Units Subcutaneous Q8H   insulin aspart  0-6 Units Subcutaneous Q4H   irbesartan  75 mg Oral QHS   metoCLOPramide  10 mg Oral QHS   pantoprazole (PROTONIX) IV  40 mg Intravenous Q12H   senna  2 tablet Oral Daily   Continuous Infusions:  dextrose 50 mL/hr at 07/26/23 2035   dialysis solution 2.5% low-MG/low-CA     dialysis solution 2.5% low-MG/low-CA       LOS: 1 day    Time spent: 55 minutes.    Fonnie Iba, MD  [facility] Pager #: [phone] 7PM-7AM contact night coverage as above

## 2023-07-27 NOTE — Plan of Care (Signed)
 ?  Problem: Clinical Measurements: ?Goal: Will remain free from infection ?Outcome: Progressing ?  ?

## 2023-07-27 NOTE — Care Management (Signed)
 Transition of Care [facility]) - Inpatient Brief Assessment   Patient Details  Name: Chase Clark MRN: [id number] Date of Birth: 03/20/1966  [facility] CM/SW Contact:    Ronni Colace, RN Phone Number: 07/27/2023, 12:13 PM   Clinical Narrative: Presented with AMS encephalopathy started taking baclofan. Does CCPD at home on cycler. K+ on admission was elevated. Has DME, patient is legally blind, BKA. Family wife assists, performs CCPD.   TOC will follow for any needs, recommendations, or transitions of care.    Transition of Care Asessment: Insurance and Status: Insurance coverage has been reviewed Patient has primary care physician: Yes Home environment has been reviewed: Lives with spouse Prior level of function:: Independent with Clinical cytogeneticist Home Services: No current home services Social Drivers of Health Review: SDOH reviewed no interventions necessary Readmission risk has been reviewed: Yes Transition of care needs: no transition of care needs at this time

## 2023-07-27 NOTE — Inpatient Diabetes Management (Addendum)
 Inpatient Diabetes Program Recommendations  AACE/ADA: New Consensus Statement on Inpatient Glycemic Control (2015)  Target Ranges:  Prepandial:   less than 140 mg/dL      Peak postprandial:   less than 180 mg/dL (1-2 hours)      Critically ill patients:  140 - 180 mg/dL   Lab Results  Component Value Date   GLUCAP 254 (H) 07/27/2023   HGBA1C 4.9 07/26/2023    Review of Glycemic Control  Latest Reference Range & Units 07/27/23 01:14 07/27/23 05:30 07/27/23 08:24  Glucose-Capillary 70 - 99 mg/dL 952 (H) 841 (H) 324 (H)  (H): Data is abnormally high Diabetes history: Type 1 DM Outpatient Diabetes medications: Tresiba 42 units BID, Humalog 15 units TID, NPH 70 units QHS Current orders for Inpatient glycemic control: Novolog 0-6 units Q4H  Inpatient Diabetes Program Recommendations:    Consider adding Semglee 20 units every day. Discontinue D10%. At the start of PD, anticipate patient to require NPH. Also, consider adding NPH 20 units at bedtime. If diet to advance, would also add Novolog 5 units TID (assuming patient to consume>50% of meals). Will plan to see.   Addendum: spoke with patient and wife regarding outpatient diabetes management. Patient is on PD and uses dialysate that contains Dextrose. Verified home insulin doses. Wife denies having episodes of hypoglycemia at home; uses Dexcom and wife calibrates device. Per wife, NPH was administered prior to PD hookup. Usually, once cycle begins they eat dinner. This did not occur on the night the episode of encephalopathy began, hence the possibility for low on presentation. Wife reports that blood sugar alarms are set at 80 mg/dL and there have been no alarms per device at home and usual trends 100-160 mg/dL.   Reviewed patient's current A1c of 4.9%. Explained what a A1c is and what it measures. Also reviewed goal A1c with patient, importance of good glucose control @ home, and blood sugar goals. Reviewed increased risk for hypoglycemia in  ESRD, potential for insulin adjustment, patho of DM, target goals, current glucose trends and recommendations, vascular changes and commorbidities.  Patient wears Dexcom, however, device had been removed for MRI. Sensors provided per MD order. Wife to apply when appropriate.  Secure chat sent to MD with recommendations.  Follow.   Thanks, Marjo Sievert, MSN, RNC-OB Diabetes Coordinator [phone] (8a-5p)

## 2023-07-27 NOTE — Progress Notes (Signed)
 PD tx initation note:   Pre TX VS:   Pre TX weight: 113kg  PD treatment initiated via aseptic technique. Consent signed and in chart. Patient is alert and oriented. No complaints of pain. No specimen collected. PD exit site clean, dry and intact. Gentamycin and new dressing applied. Bedside RN educated on PD machine and how to contact tech support when PD machine alarms.    07/26/23 2215  Peritoneal Catheter Left lower abdomen Continuous ambulatory  Placement Date/Time: 08/30/20 1610   Procedural Verification: Site marked with initials  Time out: Correct Patient;Correct Site;Correct Procedure  Person Inserting LDA: Dr. Mauri Sous  Catheter Location: Left lower abdomen  Catheter Conversion: Extension...  Site Assessment Clean, Dry, Intact  Drainage Description None  Catheter status Deaccessed  Dressing Gauze/Drain sponge  Dressing Status Clean, Dry, Intact  Dressing Intervention Removed  Cycler Setup  Total Number of Night Cycles 5  Night Fill Volume 2400  Dianeal Solution Dextrose 4.25% in 6000 mL Low Cal/Low Mag  Dianeal Additive Heparin  Night Dwell Time per Cycle - Hour(s) 1  Night Dwell Time per Cycle - Minute(s) 30  Night Time Therapy - Minute(s) 21  Night Time Therapy - Hour(s) 10  Minimum Initial Drain Volume 0  Maximum Peritoneal Volume 3600  Night/Total Therapy Volume 96045  Day Exchange No  Hand-off documentation  Hand-off Given Given to shift RN/LPN  Report given to (Full Name) Landyn Lorincz, RN  Hand-off Received Received from shift RN/LPN  Report received from (Full Name) Felipa Horsfall, RN

## 2023-07-28 DIAGNOSIS — G9341 Metabolic encephalopathy: Secondary | ICD-10-CM | POA: Diagnosis present

## 2023-07-28 DIAGNOSIS — G9340 Encephalopathy, unspecified: Secondary | ICD-10-CM | POA: Diagnosis not present

## 2023-07-28 LAB — GLUCOSE, CAPILLARY
Glucose-Capillary: 184 mg/dL — ABNORMAL HIGH (ref 70–99)
Glucose-Capillary: 190 mg/dL — ABNORMAL HIGH (ref 70–99)
Glucose-Capillary: 204 mg/dL — ABNORMAL HIGH (ref 70–99)
Glucose-Capillary: 216 mg/dL — ABNORMAL HIGH (ref 70–99)
Glucose-Capillary: 222 mg/dL — ABNORMAL HIGH (ref 70–99)
Glucose-Capillary: 236 mg/dL — ABNORMAL HIGH (ref 70–99)

## 2023-07-28 MED ORDER — DORZOLAMIDE HCL 2 % OP SOLN
1.0000 [drp] | Freq: Two times a day (BID) | OPHTHALMIC | Status: DC
  Administered 2023-07-28 – 2023-07-30 (×5): 1 [drp] via OPHTHALMIC
  Filled 2023-07-28: qty 10

## 2023-07-28 MED ORDER — DELFLEX-LC/1.5% DEXTROSE 344 MOSM/L IP SOLN: INTRAPERITONEAL | Status: DC

## 2023-07-28 MED ORDER — HEPARIN SODIUM (PORCINE) 1000 UNIT/ML IJ SOLN
INTRAPERITONEAL | Status: DC | PRN
  Filled 2023-07-28 (×5): qty 6000

## 2023-07-28 MED ORDER — LATANOPROST 0.005 % OP SOLN
1.0000 [drp] | Freq: Every morning | OPHTHALMIC | Status: DC
  Administered 2023-07-28 – 2023-07-30 (×3): 1 [drp] via OPHTHALMIC
  Filled 2023-07-28: qty 2.5

## 2023-07-28 MED ORDER — HEPARIN SODIUM (PORCINE) 1000 UNIT/ML IJ SOLN
INTRAPERITONEAL | Status: DC | PRN
  Filled 2023-07-28: qty 2000

## 2023-07-28 MED ORDER — TIMOLOL MALEATE 0.5 % OP SOLN
1.0000 [drp] | Freq: Two times a day (BID) | OPHTHALMIC | Status: DC
  Administered 2023-07-28 – 2023-07-30 (×5): 1 [drp] via OPHTHALMIC
  Filled 2023-07-28: qty 5

## 2023-07-28 MED ORDER — BRIMONIDINE TARTRATE-TIMOLOL 0.2-0.5 % OP SOLN
1.0000 [drp] | Freq: Two times a day (BID) | OPHTHALMIC | Status: DC
  Filled 2023-07-28: qty 5

## 2023-07-28 MED ORDER — DIPHENHYDRAMINE HCL 50 MG/ML IJ SOLN
12.5000 mg | Freq: Once | INTRAMUSCULAR | Status: AC
  Administered 2023-07-28: 12.5 mg via INTRAVENOUS
  Filled 2023-07-28: qty 1

## 2023-07-28 MED ORDER — ASPIRIN 81 MG PO TBEC
81.0000 mg | DELAYED_RELEASE_TABLET | Freq: Every day | ORAL | Status: DC
  Administered 2023-07-29 – 2023-07-30 (×2): 81 mg via ORAL
  Filled 2023-07-28 (×2): qty 1

## 2023-07-28 MED ORDER — BRIMONIDINE TARTRATE 0.2 % OP SOLN
1.0000 [drp] | Freq: Two times a day (BID) | OPHTHALMIC | Status: DC
  Administered 2023-07-28 – 2023-07-30 (×5): 1 [drp] via OPHTHALMIC
  Filled 2023-07-28: qty 5

## 2023-07-28 MED ORDER — FLUTICASONE PROPIONATE 50 MCG/ACT NA SUSP
1.0000 | Freq: Once | NASAL | Status: DC
  Filled 2023-07-28: qty 16

## 2023-07-28 NOTE — Progress Notes (Signed)
 Initial Nutrition Assessment  DOCUMENTATION CODES:   Obesity unspecified  INTERVENTION:  Continue Renal/carb mod diet as ordered  Encourage po intake.   48h calorie count ordered. Instructions and envelope taped on door.   NUTRITION DIAGNOSIS:   Inadequate oral intake related to lethargy/confusion as evidenced by meal completion < 25%.  GOAL:   Patient will meet greater than or equal to 90% of their needs  MONITOR:   PO intake  REASON FOR ASSESSMENT:   Consult Calorie Count  ASSESSMENT:   PMH: end-stage renal disease on peritoneal dialysis, hypertension, diabetes mellitus, legally blind, PVD, history of CABG and left BKA, amongst multiple medical and cardiac problems. Admitted for acute encephalopathy with unclear etiology.  Pt did not eat anything from admission (Sun) until today (Tues lunch). Pt ate 100% of roast beef tray for lunch. Pt reports appetite normally very good. Pt was un alert and lethargic yesterday so did not eat but a few bites. Calorie count ordered to monitor intake.   Medications reviewed and include: SSI 0-6 units q4h, reglan, protonix, flomax  Labs reviewed: A1c 4.9%, CBGs 150-263 x24h, Creatinine 8.4 L, Phos 6.6 H, albumin 2.3 L   Intake/Output Summary (Last 24 hours) at 07/28/2023 1417 Last data filed at 07/28/2023 0815 Gross per 24 hour  Intake 1066.96 ml  Output 1166 ml  Net -99.04 ml    Weights reviewed. Admit weight 115.7 kg. -10 kg (8%) in 10 months. Pt denies any wt loss in last year.   NUTRITION - FOCUSED PHYSICAL EXAM:  Flowsheet Row Most Recent Value  Orbital Region No depletion  Upper Arm Region No depletion  Thoracic and Lumbar Region No depletion  Buccal Region No depletion  Temple Region No depletion  Clavicle Bone Region No depletion  Clavicle and Acromion Bone Region No depletion  Scapular Bone Region No depletion  Dorsal Hand No depletion  Patellar Region No depletion  Anterior Thigh Region No depletion  Posterior  Calf Region No depletion  Edema (RD Assessment) Mild  Hair Reviewed  Eyes Reviewed  Mouth Reviewed  Skin Reviewed  Nails Reviewed       Diet Order:   Diet Order             Diet renal/carb modified with fluid restriction Diet-HS Snack? Nothing; Fluid restriction: 1200 mL Fluid; Room service appropriate? Yes; Fluid consistency: Thin  Diet effective now                   EDUCATION NEEDS:   Education needs have been addressed  Skin:  Skin Assessment: Reviewed RN Assessment  Last BM:  PTA  Height:   Ht Readings from Last 1 Encounters:  07/26/23 5\' 10"  (1.778 m)    Weight:   Wt Readings from Last 1 Encounters:  07/28/23 110.3 kg    Ideal Body Weight:     BMI:  Body mass index is 34.89 kg/m.  Estimated Nutritional Needs:   Kcal:  1600-1900  Protein:  130-150  Fluid:  >1.6L or per MD  Laren Player, MPH, RD, LDN Clinical Dietitian Contact information can be found at [facility].

## 2023-07-28 NOTE — Plan of Care (Signed)

## 2023-07-28 NOTE — Progress Notes (Signed)
 [facility] Progress Note  Subjective:  Seen in room Pt awake today, still confused though Looks much better  Vitals:   07/28/23 0349 07/28/23 0713 07/28/23 0717 07/28/23 0825  BP: 119/78  (!) 142/76 (!) 158/69  Pulse: 85  88   Resp: 20  15   Temp: 99.2 F (37.3 C)  98.9 F (37.2 C)   TempSrc: Axillary  Axillary   SpO2: 95%  95%   Weight:  110.3 kg    Height:        Exam:  Alert, eyes open, interacting, a bit confused  no jvd  Chest cta bilat  Cor reg no RG  Abd soft ntnd no ascites   Ext L BKA, trace edema R LE   Neuro: awake, alert, Ox 2   PD cath in place    OP PD: CCPD, 5 cycles, 2.4 L (12 L overnight) over 9 hours with Extraneal during the day.  7000 units heparin per 6 L dialysate bag.     Assessment/ Plan: Altered mental status: CT negative, was taking baclofen at home after a fall (baclofen causes confusion in esrd pts). Improving today again.  End-stage renal disease: cont PD nightly. We will hold daytime icodextrin while here. PD tonight.  Hypertension: BP's were high, now normal to low. All 1.5% fluids.  Anemia: Hemoglobin currently at goal, no indication for ESA therapy. Secondary hyperparathyroidism: CCa okay. Restart phosphorus binder when mental status allows for adequate oral intake.  Larry Poag MD  [facility] 07/28/2023, 11:42 AM  Recent Labs  Lab 07/26/23 1150 07/26/23 1205 07/26/23 1842 07/26/23 2025 07/27/23 0329  HGB 12.2*   < > 13.6 14.5 10.4*  ALBUMIN 2.6*  --   --   --  2.3*  CALCIUM 8.6*  --   --   --  8.4*  PHOS  --   --   --  6.1* 6.6*  CREATININE 7.92*  --   --  8.21* 8.32*  K 5.0   < > 4.2  --  4.1   < > = values in this interval not displayed.   No results for input(s): "IRON", "TIBC", "FERRITIN" in the last 168 hours. Inpatient medications:  amLODipine  10 mg Oral Daily   [START ON 07/29/2023] aspirin EC  81 mg Oral Daily   atorvastatin  80 mg Oral QHS   brimonidine  1 drop Both Eyes Q12H   And   timolol  1  drop Both Eyes Q12H   dorzolamide  1 drop Both Eyes BID   ezetimibe  10 mg Oral Daily   fluticasone  1 spray Each Nare Once   gentamicin cream  1 Application Topical Daily   heparin  5,000 Units Subcutaneous Q8H   insulin aspart  0-6 Units Subcutaneous Q4H   irbesartan  75 mg Oral QHS   latanoprost  1 drop Both Eyes q AM   metoCLOPramide  10 mg Oral QHS   pantoprazole (PROTONIX) IV  40 mg Intravenous Q12H   senna  2 tablet Oral Daily   tamsulosin  0.4 mg Oral Daily   torsemide  40 mg Oral 2 times per day    dextrose 50 mL/hr at 07/27/23 1715   dialysis solution 2.5% low-MG/low-CA     dialysis solution 2.5% low-MG/low-CA     acetaminophen, heparin sodium (porcine) 3,000 Units in dialysis solution 1.5% low-MG/low-CA 2,000 mL dialysis solution, heparin sodium (porcine) 6,000 Units in dialysis solution 2.5% low-MG/low-CA 6,000 mL dialysis solution, heparin sodium (porcine) 7,000  Units in dialysis solution 2.5% low-MG/low-CA 6,000 mL dialysis solution, heparin sodium (porcine) 7,000 Units in dialysis solution 4.25% low-MG/low-CA 6,000 mL dialysis solution, hydrALAZINE

## 2023-07-28 NOTE — Progress Notes (Addendum)
 PROGRESS NOTE  Chase Clark ZOX:[id number] DOB: 27-Dec-1965 DOA: 07/26/2023 PCP: No primary care provider on file.   LOS: 2 days   Brief narrative:   Patient is a 58 year old male, obese, with past medical history significant for end-stage renal disease on peritoneal dialysis, hypertension, diabetes mellitus, legally blind, PVD, history of CABG and left BKA, amongst multiple medical and cardiac problems.  Patient presented to hospital with altered mental status and was admitted for encephalopathy and hypertensive urgency/accelerated hypertension.  Imaging was negative including CT and MRI head.  Patient was noted to be hyperglycemic but no obvious signs of infection.  Initially was on D10 for hypoglycemia.  Patient was then admitted hospital for further evaluation and treatment.     Assessment/Plan: Principal Problem:   Acute encephalopathy Active Problems:   Hyperlipidemia associated with type 2 diabetes mellitus (HCC)   Hypertension associated with diabetes (HCC)   Obesity (BMI 30-39.9)   PAD (peripheral artery disease) (HCC)   ESRD (end stage renal disease) (HCC)   Metabolic encephalopathy   Acute metabolic encephalopathy: Likely secondary to baclofen induced.  Other possibilities include hypoglycemia, accelerated hypertension, undiagnosed sleep apnea/ obesity hypoventilation syndrome.  Family states that after taking baclofen symptoms started.  At this time we will continue to optimize...  Hemoglobin A1c was 4.9.  Diabetic coordinator on board.  Patient does have a ESRD.  CT head and MRI of the brain was negative for acute findings.  No signs of infection at this time.  Urine drug screen was negative and ammonia was less than 30.  Alcohol of less than 10.- A1c of 4.9%.  Follow-up with pulmonary team on discharge.  Continue peritoneal dialysis.  Blood cultures negative in 24 hours.  No leukocytosis HIV was nonreactive.  Vitamin B12 at 676 folate at 14.5.   Hypertensive  urgency/accelerated hypertension: Initial systolic blood pressure was 202.  Has been started on amlodipine torsemide has been started and continue irbesartan 75 Mg p.o. nightly.  Have a low threshold to add Coreg.  Gradually improving.  Blood pressure has improved at this time.  ESRD on peritoneal dialysis: Nephrology on board for continuation of peritoneal dialysis   Likely undiagnosed OSA/at bedtime: ABG showed pCO2 50.  Will benefit from a sleep study as outpatient.  Communicative at this time.   Level I Hypoglycemia: Diabetes mellitus type 1. Initially required D10 drip.  Encourage oral nutrition.   Grade 2 obesity. Body mass index is 35.97 kg/m.  Would benefit from weight loss as outpatient.   Anemia with MCV of 103: - B12 is 676. - Folate is 14.5.     Fatty liver: Will need to follow-up as outpatient.   Coronary artery disease: -History of CABG. no active issues   PVD: -Status post left BKA. DVT prophylaxis: heparin injection 5,000 Units Start: 07/26/23 2200   Disposition: Likely home in 1 to 2 days.  Status is: Inpatient Remains inpatient appropriate because: Altered mental status, ESRD requiring peritoneal dialysis    Code Status:     Code Status: Full Code  Family Communication: Spoke with the patient's spouse and family at bedside.  Consultants: Nephrology  Procedures: None yet  Anti-infectives:  None  Anti-infectives (From admission, onward)    None        Subjective: Today, patient was seen and examined at bedside.  Patient's family at bedside.  Patient was hallucinating as per the family and was disoriented but was oriented to place  Objective: Vitals:   07/28/23 0825 07/28/23 1157  BP: (!) 158/69 (!) 142/80  Pulse:  78  Resp:  15  Temp:  98.1 F (36.7 C)  SpO2:  98%    Intake/Output Summary (Last 24 hours) at 07/28/2023 1301 Last data filed at 07/28/2023 0815 Gross per 24 hour  Intake 1066.96 ml  Output 1166 ml  Net -99.04 ml    Filed Weights   07/27/23 0517 07/27/23 1900 07/28/23 0713  Weight: 113.7 kg 113.7 kg 110.3 kg   Body mass index is 34.89 kg/m.   Physical Exam:  GENERAL: Patient is alert awake and oriented to self and place.  Disoriented to time month.. Not in obvious distress.  Legally blind, obese, HENT: No scleral pallor or icterus. Pupils equally reactive to light. Oral mucosa is moist NECK: is supple, no gross swelling noted. CHEST: Clear to auscultation. No crackles or wheezes.  CVS: S1 and S2 heard, no murmur. Regular rate and rhythm.  ABDOMEN: Soft, non-tender, bowel sounds are present.  Peritoneal dialysis catheter in place. EXTREMITIES: No edema. CNS: Cranial nerves are intact.  Moves extremities. SKIN: warm and dry without rashes.  Data Review: I have personally reviewed the following laboratory data and studies,  CBC: Recent Labs  Lab 07/26/23 1150 07/26/23 1205 07/26/23 1842 07/26/23 2025 07/27/23 0329  WBC 8.8  --   --  5.3 7.8  NEUTROABS 6.8  --   --   --   --   HGB 12.2* 10.5* 13.6 14.5 10.4*  HCT 36.7* 31.0* 40.0 45.2 31.8*  MCV 103.7*  --   --  102.7* 102.6*  PLT 156  --   --  125* 174   Basic Metabolic Panel: Recent Labs  Lab 07/26/23 1150 07/26/23 1205 07/26/23 1842 07/26/23 2025 07/27/23 0329  NA 135 132* 137  --  136  K 5.0 8.2* 4.2  --  4.1  CL 99  --   --   --  102  CO2 22  --   --   --  21*  GLUCOSE 83  --   --   --  221*  BUN 48*  --   --   --  49*  CREATININE 7.92*  --   --  8.21* 8.32*  CALCIUM 8.6*  --   --   --  8.4*  MG  --   --   --  1.6*  --   PHOS  --   --   --  6.1* 6.6*   Liver Function Tests: Recent Labs  Lab 07/26/23 1150 07/27/23 0329  AST 42*  --   ALT 23  --   ALKPHOS 97  --   BILITOT 0.7  --   PROT 6.4*  --   ALBUMIN 2.6* 2.3*   Recent Labs  Lab 07/26/23 1150  LIPASE 39   Recent Labs  Lab 07/26/23 1150  AMMONIA 30   Cardiac Enzymes: No results for input(s): "CKTOTAL", "CKMB", "CKMBINDEX", "TROPONINI" in the  last 168 hours. BNP (last 3 results) Recent Labs    07/26/23 1150  BNP 62.0    ProBNP (last 3 results) No results for input(s): "PROBNP" in the last 8760 hours.  CBG: Recent Labs  Lab 07/27/23 1521 07/27/23 2009 07/28/23 0012 07/28/23 0344 07/28/23 1154  GLUCAP 150* 191* 236* 222* 184*   Recent Results (from the past 240 hours)  Blood Culture (routine x 2)     Status: None (Preliminary result)   Collection Time: 07/26/23 11:45 AM   Specimen: BLOOD RIGHT HAND  Result Value  Ref Range Status   Specimen Description BLOOD RIGHT HAND  Final   Special Requests   Final    BOTTLES DRAWN AEROBIC AND ANAEROBIC Blood Culture results may not be optimal due to an inadequate volume of blood received in culture bottles   Culture   Final    NO GROWTH 2 DAYS Performed at [facility], [address], [place], [place] [address]    Report Status PENDING  Incomplete  Blood Culture (routine x 2)     Status: None (Preliminary result)   Collection Time: 07/26/23 11:50 AM   Specimen: BLOOD LEFT HAND  Result Value Ref Range Status   Specimen Description BLOOD LEFT HAND  Final   Special Requests   Final    BOTTLES DRAWN AEROBIC AND ANAEROBIC Blood Culture results may not be optimal due to an inadequate volume of blood received in culture bottles   Culture   Final    NO GROWTH 2 DAYS Performed at [facility], [address], [place], [place] [address]    Report Status PENDING  Incomplete     Studies: MR BRAIN WO CONTRAST Result Date: 07/26/2023 CLINICAL DATA:  Mental status change, unknown cause. EXAM: MRI HEAD WITHOUT CONTRAST TECHNIQUE: Multiplanar, multiecho pulse sequences of the brain and surrounding structures were obtained without intravenous contrast. COMPARISON:  Head CT 07/26/2023 and MRI 10/22/2006 FINDINGS: Brain: There is no evidence of an acute infarct, intracranial hemorrhage, mass, midline shift, or extra-axial fluid collection. Chronic bilateral thalamic lacunar  infarcts are new from the remote MRI. There are a few remote small-vessel insults in the cerebral white matter as well. There is mild cerebral atrophy. Vascular: Major intracranial vascular flow voids are preserved. Skull and upper cervical spine: Unremarkable bone marrow signal. Sinuses/Orbits: Chronic bilateral sphenoid, ethmoid, and right maxillary sinusitis with prominent T2 hypointensity in the left sphenoid and right maxillary sinuses potentially reflecting allergic fungal sinusitis. Clear mastoid air cells. Bilateral cataract extraction. Other: None. IMPRESSION: 1. No acute intracranial abnormality. 2. 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 3. Chronic sinusitis. Electronically Signed   By: Sebastian Ache M.D.   On: 07/26/2023 16:52   CT HEAD WO CONTRAST ( ) Result Date: 07/26/2023 CLINICAL DATA:  Mental status change, unknown cause; Neck trauma, intoxicated or obtunded (Age >= 16y) EXAM: CT HEAD WITHOUT CONTRAST CT CERVICAL SPINE WITHOUT CONTRAST TECHNIQUE: Multidetector CT imaging of the head and cervical spine was performed following the standard protocol without intravenous contrast. Multiplanar CT image reconstructions of the cervical spine were also generated. RADIATION DOSE REDUCTION: This exam was performed according to the departmental dose-optimization program which includes automated exposure control, adjustment of the mA and/or kV according to patient size and/or use of iterative reconstruction technique. COMPARISON:  None Available. FINDINGS: CT HEAD FINDINGS Brain: No evidence of large-territorial acute infarction. No parenchymal hemorrhage. No mass lesion. No extra-axial collection. No mass effect or midline shift. No hydrocephalus. Basilar cisterns are patent. Vascular: No hyperdense vessel. Atherosclerotic calcifications are present within the cavernous internal carotid and vertebral arteries. Skull: No acute fracture or focal lesion. Sinuses/Orbits: Complete  opacification of the bilateral sphenoid sinuses. Almost complete opacification of the right maxillary and bilateral ethmoid sinuses. Curvilinear calcification of the right maxillary sinus suggestive of chronic versus fungal infection. Otherwise remaining paranasal sinuses and mastoid air cells are clear. Bilateral lens replacement. Otherwise the orbits are unremarkable. Other: None. CT CERVICAL SPINE FINDINGS Alignment: Normal. Skull base and vertebrae: C5-C6 anterior cervical discectomy and fusion.  Multilevel moderate degenerative changes of the spine. No associated severe osseous neural foraminal or central canal stenosis. No acute fracture. No aggressive appearing focal osseous lesion or focal pathologic process. Soft tissues and spinal canal: No prevertebral fluid or swelling. No visible canal hematoma. Upper chest: Unremarkable. Other: Atherosclerotic plaque of the carotid arteries within the neck. IMPRESSION: 1. No acute intracranial abnormality. 2. No acute displaced fracture or traumatic listhesis of the cervical spine. 3. Sinus disease as above. Electronically Signed   By: Morgane  Naveau M.D.   On: 07/26/2023 14:22   CT Cervical Spine Wo Contrast Result Date: 07/26/2023 CLINICAL DATA:  Mental status change, unknown cause; Neck trauma, intoxicated or obtunded (Age >= 16y) EXAM: CT HEAD WITHOUT CONTRAST CT CERVICAL SPINE WITHOUT CONTRAST TECHNIQUE: Multidetector CT imaging of the head and cervical spine was performed following the standard protocol without intravenous contrast. Multiplanar CT image reconstructions of the cervical spine were also generated. RADIATION DOSE REDUCTION: This exam was performed according to the departmental dose-optimization program which includes automated exposure control, adjustment of the mA and/or kV according to patient size and/or use of iterative reconstruction technique. COMPARISON:  None Available. FINDINGS: CT HEAD FINDINGS Brain: No evidence of large-territorial  acute infarction. No parenchymal hemorrhage. No mass lesion. No extra-axial collection. No mass effect or midline shift. No hydrocephalus. Basilar cisterns are patent. Vascular: No hyperdense vessel. Atherosclerotic calcifications are present within the cavernous internal carotid and vertebral arteries. Skull: No acute fracture or focal lesion. Sinuses/Orbits: Complete opacification of the bilateral sphenoid sinuses. Almost complete opacification of the right maxillary and bilateral ethmoid sinuses. Curvilinear calcification of the right maxillary sinus suggestive of chronic versus fungal infection. Otherwise remaining paranasal sinuses and mastoid air cells are clear. Bilateral lens replacement. Otherwise the orbits are unremarkable. Other: None. CT CERVICAL SPINE FINDINGS Alignment: Normal. Skull base and vertebrae: C5-C6 anterior cervical discectomy and fusion. Multilevel moderate degenerative changes of the spine. No associated severe osseous neural foraminal or central canal stenosis. No acute fracture. No aggressive appearing focal osseous lesion or focal pathologic process. Soft tissues and spinal canal: No prevertebral fluid or swelling. No visible canal hematoma. Upper chest: Unremarkable. Other: Atherosclerotic plaque of the carotid arteries within the neck. IMPRESSION: 1. No acute intracranial abnormality. 2. No acute displaced fracture or traumatic listhesis of the cervical spine. 3. Sinus disease as above. Electronically Signed   By: Morgane  Naveau M.D.   On: 07/26/2023 14:22      Rosena Conradi, MD  [facility] 07/28/2023  If 7PM-7AM, please contact night-coverage

## 2023-07-28 NOTE — Inpatient Diabetes Management (Signed)
 Inpatient Diabetes Program Recommendations  AACE/ADA: New Consensus Statement on Inpatient Glycemic Control (2015)  Target Ranges:  Prepandial:   less than 140 mg/dL      Peak postprandial:   less than 180 mg/dL (1-2 hours)      Critically ill patients:  140 - 180 mg/dL   Lab Results  Component Value Date   GLUCAP 222 (H) 07/28/2023   HGBA1C 4.9 07/26/2023    Review of Glycemic Control  Latest Reference Range & Units 07/27/23 08:24 07/27/23 11:55 07/27/23 15:21 07/27/23 20:09 07/28/23 00:12 07/28/23 03:44  Glucose-Capillary 70 - 99 mg/dL 409 (H) 811 (H) 914 (H) 191 (H) 236 (H) 222 (H)  (H): Data is abnormally high Diabetes history: Type 1 DM Outpatient Diabetes medications: Tresiba 42 units BID, Humalog 15 units TID, NPH 70 units QHS Current orders for Inpatient glycemic control: Novolog 0-6 units Q4H   Inpatient Diabetes Program Recommendations:     Consider adding Semglee 12 units every day. Discontinue D10%.   Thanks, Marjo Sievert, MSN, RNC-OB Diabetes Coordinator [phone] (8a-5p)

## 2023-07-28 NOTE — Progress Notes (Addendum)
   07/28/23 0815  Peritoneal Catheter Left lower abdomen Continuous ambulatory  Placement Date/Time: 08/30/20 1610   Procedural Verification: Site marked with initials  Time out: Correct Patient;Correct Site;Correct Procedure  Person Inserting LDA: Dr. Mauri Sous  Catheter Location: Left lower abdomen  Catheter Conversion: Extension...  Site Assessment Clean, Dry, Intact  Drainage Description None  Catheter status Deaccessed;Clamped  Dressing Gauze/Drain sponge  Dressing Status Clean, Dry, Intact  Completion  Treatment Status Complete  Initial Drain Volume 5  Average Dwell Time-Hour(s) 1  Average Dwell Time-Min(s) 30  Average Drain Time 30  Total Therapy Volume 96045  Total Therapy Time-Hour(s) 11  Total Therapy Time-Min(s) 8  Effluent Appearance Clear;Yellow  Fluid Balance - CCPD  Total UF (+ value on cycler, pt loss) 566 mL  Procedure Comments  Tolerated treatment well? Yes   PD post treatment note  PD treatment completed. Patient tolerated treatment well. PD effluent is clear. No specimen collected.  PD exit site clean, dry and intact. Patient is awake, oriented and in no acute distress.  Report given to bedside nurse.   Post treatment VS: BP 142/76 HR 88 RR15 O2 95% RA T 98.9

## 2023-07-28 NOTE — Hospital Course (Signed)
 Patient is a 58 year old male, obese, with past medical history significant for end-stage renal disease on peritoneal dialysis, hypertension, diabetes mellitus, legally blind, PVD, history of CABG and left BKA, amongst multiple medical and cardiac problems.  Patient presented to hospital with altered mental status and was admitted for encephalopathy and hypertensive urgency/accelerated hypertension.  Imaging was negative including CT and MRI head.  Patient was noted to be hyperglycemic but no obvious signs of infection.  Initially was on D10 for hypoglycemia.     Acute metabolic encephalopathy: Likely secondary to hypoglycemia, accelerated hypertension, undiagnosed sleep apnea/ obesity hypoventilation syndrome..  Hemoglobin A1c was 4.9.  Diabetic coordinator on board.  Patient does have a ESRD.  CT head and MRI of the brain was negative for acute findings.  No signs of infection at this time.  Urine drug screen was negative and ammonia was less than 30.  Alcohol of less than 10.- A1c of 4.9%.  Continue CPAP.  Follow-up with pulmonary team on discharge.  Continue peritoneal dialysis.  Blood cultures negative in 24 hours.  No leukocytosis HIV was nonreactive.  Vitamin B12 at 676 folate at 14.5.   Hypertensive urgency/accelerated hypertension: Initial systolic blood pressure was 202.  Has been started on amlodipine torsemide has been started and continue irbesartan 75 Mg p.o. nightly.  Have a low threshold to add Coreg.  Gradually improving.  ESRD on peritoneal dialysis: Nephrology on board for continuation of peritoneal dialysis   Likely undiagnosed OSA/at bedtime: ABG showed pCO2 50.  Will benefit from a sleep study as outpatient.   Hypoglycemia: Initially required D10 drip.  Encourage oral nutrition.   Grade 2 obesity. Body mass index is 35.97 kg/m.  Would benefit from weight loss as outpatient.   Anemia with MCV of 103: - B12 is 676. - Folate is 14.5.     Fatty liver: Will need to  follow-up as outpatient.   Coronary artery disease: -History of CABG. no active issues   PVD: -Status post left BKA.

## 2023-07-29 ENCOUNTER — Telehealth: Payer: Self-pay

## 2023-07-29 DIAGNOSIS — G9340 Encephalopathy, unspecified: Secondary | ICD-10-CM | POA: Diagnosis not present

## 2023-07-29 LAB — COMPREHENSIVE METABOLIC PANEL WITH GFR
ALT: 23 U/L (ref 0–44)
AST: 26 U/L (ref 15–41)
Albumin: 2.2 g/dL — ABNORMAL LOW (ref 3.5–5.0)
Alkaline Phosphatase: 105 U/L (ref 38–126)
Anion gap: 13 (ref 5–15)
BUN: 47 mg/dL — ABNORMAL HIGH (ref 6–20)
CO2: 25 mmol/L (ref 22–32)
Calcium: 7.7 mg/dL — ABNORMAL LOW (ref 8.9–10.3)
Chloride: 96 mmol/L — ABNORMAL LOW (ref 98–111)
Creatinine, Ser: 9.35 mg/dL — ABNORMAL HIGH (ref 0.61–1.24)
GFR, Estimated: 6 mL/min — ABNORMAL LOW (ref >60)
Glucose, Bld: 216 mg/dL — ABNORMAL HIGH (ref 70–99)
Potassium: 3.7 mmol/L (ref 3.5–5.1)
Sodium: 134 mmol/L — ABNORMAL LOW (ref 135–145)
Total Bilirubin: 0.8 mg/dL (ref 0.0–1.2)
Total Protein: 5.3 g/dL — ABNORMAL LOW (ref 6.5–8.1)

## 2023-07-29 LAB — GLUCOSE, CAPILLARY
Glucose-Capillary: 190 mg/dL — ABNORMAL HIGH (ref 70–99)
Glucose-Capillary: 180 mg/dL — ABNORMAL HIGH (ref 70–99)
Glucose-Capillary: 210 mg/dL — ABNORMAL HIGH (ref 70–99)
Glucose-Capillary: 168 mg/dL — ABNORMAL HIGH (ref 70–99)

## 2023-07-29 LAB — CBC
HCT: 29.8 % — ABNORMAL LOW (ref 39.0–52.0)
Hemoglobin: 9.9 g/dL — ABNORMAL LOW (ref 13.0–17.0)
MCH: 33.7 pg (ref 26.0–34.0)
MCHC: 33.2 g/dL (ref 30.0–36.0)
MCV: 101.4 fL — ABNORMAL HIGH (ref 80.0–100.0)
Platelets: 157 10*3/uL (ref 150–400)
RBC: 2.94 MIL/uL — ABNORMAL LOW (ref 4.22–5.81)
RDW: 14.1 % (ref 11.5–15.5)
WBC: 8.5 10*3/uL (ref 4.0–10.5)
nRBC: 0 % (ref 0.0–0.2)

## 2023-07-29 LAB — MAGNESIUM: Magnesium: 1.3 mg/dL — ABNORMAL LOW (ref 1.7–2.4)

## 2023-07-29 MED ORDER — MAGNESIUM SULFATE 2 GM/50ML IV SOLN
2.0000 g | Freq: Once | INTRAVENOUS | Status: AC
Start: 2023-07-29 — End: 2023-07-29
  Administered 2023-07-29: 2 g via INTRAVENOUS
  Filled 2023-07-29: qty 50

## 2023-07-29 MED ORDER — DELFLEX-LC/1.5% DEXTROSE 344 MOSM/L IP SOLN: INTRAPERITONEAL | Status: DC

## 2023-07-29 NOTE — Progress Notes (Deleted)
 Physical Therapy Treatment Patient Details Name: Chase Clark MRN: [id number] DOB: Oct 27, 1965 Today's Date: 07/29/2023   History of Present Illness Patient is a 58 year old male admitted 4/13 with altered mental status and was admitted for encephalopathy and hypertensive urgency/accelerated hypertension.  Recent start of Baclofen.   Imaging was negative including CT and MRI head.  NWG:NFAOZ, end-stage renal disease on peritoneal dialysis, hypertension, diabetes mellitus, legally blind, PVD, history of CABG and left BKA, amongst multiple medical and cardiac problems    PT Comments  Pt admitted with above diagnosis. Pt very limited in mobility and needed mod assist with all aspects of mobility today. Prosthesis not fitting as well as significant balance issues in standing needing mod assist. Issued gait belt to pt/wife. Notified MD and CM as to pts status today.  Wife states pt can go home at wheelchair level as well therefore feel hopeful that if PT can see pt again tomorrow it would be better and PT can check prosthetic fit with other socks. Will follow acutely.   Pt currently with functional limitations due to the deficits listed below (see PT Problem List). Pt will benefit from acute skilled PT to increase their independence and safety with mobility to allow discharge.       If plan is discharge home, recommend the following: A little help with walking and/or transfers;A little help with bathing/dressing/bathroom;Assistance with cooking/housework;Help with stairs or ramp for entrance;Assist for transportation;Supervision due to cognitive status   Can travel by private vehicle        Equipment Recommendations  Rolling walker (2 wheels) (may need bariatric RW)    Recommendations for Other Services       Precautions / Restrictions Precautions Precautions: Fall Required Braces or Orthoses: Other Brace Other Brace: left BK prosthesis Restrictions Weight Bearing Restrictions Per Provider  Order: No     Mobility  Bed Mobility Overal bed mobility: Needs Assistance Bed Mobility: Supine to Sit     Supine to sit: Mod assist, Used rails, HOB elevated     General bed mobility comments: Pt having difficulty initiating movement therefore needed mod assist and cues to roll and move to sitting EOB.    Transfers Overall transfer level: Needs assistance Equipment used: Rolling walker (2 wheels) Transfers: Sit to/from Stand Sit to Stand: Mod assist, From elevated surface           General transfer comment: Pt could not don prosthesis. Wife donned for pt.  Prosthetic would not click into place first stand attempt and appeared that sock too large and therefore fit wouldnt work.  Pt needing mod assist to steady in standing due to significant posterior lean.  Pt stood again after removing sock however prosthesis still didnt fit with same assist to stand but did click in a few times and pt was able to side step to [facility] with mod assist and cues and continued posterior lean with instability.    Ambulation/Gait                   Stairs             Wheelchair Mobility     Tilt Bed    Modified Rankin (Stroke Patients Only)       Balance Overall balance assessment: Needs assistance Sitting-balance support: No upper extremity supported, Feet supported Sitting balance-Leahy Scale: Fair     Standing balance support: Bilateral upper extremity supported, During functional activity, Reliant on assistive device for balance Standing balance-Leahy Scale: Poor Standing balance  comment: Pt able to stand to RW needing mod assist for safety due to posterior lean.                            Communication Communication Communication: No apparent difficulties  Cognition Arousal: Alert Behavior During Therapy: Flat affect   PT - Cognitive impairments: Orientation, Awareness, Memory, Sequencing, Problem solving, Safety/Judgement, Initiation, Attention    Orientation impairments: Time                     Following commands: Impaired Following commands impaired: Follows one step commands inconsistently, Follows one step commands with increased time    Cueing Cueing Techniques: Verbal cues, Gestural cues, Tactile cues  Exercises      General Comments General comments (skin integrity, edema, etc.): wife and daughter present      Pertinent Vitals/Pain Pain Assessment Pain Assessment: Faces Faces Pain Scale: Hurts little more Pain Location: neck Pain Descriptors / Indicators: Grimacing, Guarding, Discomfort Pain Intervention(s): Limited activity within patient's tolerance, Monitored during session, Repositioned, Patient requesting pain meds-RN notified    Home Living                          Prior Function            PT Goals (current goals can now be found in the care plan section) Acute Rehab PT Goals Patient Stated Goal: to go home PT Goal Formulation: With patient Time For Goal Achievement: 08/12/23 Potential to Achieve Goals: Good    Frequency    Min 2X/week      PT Plan      Co-evaluation              AM-PAC PT "6 Clicks" Mobility   Outcome Measure  Help needed turning from your back to your side while in a flat bed without using bedrails?: A Lot Help needed moving from lying on your back to sitting on the side of a flat bed without using bedrails?: A Lot Help needed moving to and from a bed to a chair (including a wheelchair)?: A Lot Help needed standing up from a chair using your arms (e.g., wheelchair or bedside chair)?: A Lot Help needed to walk in hospital room?: Total Help needed climbing 3-5 steps with a railing? : Total 6 Click Score: 10    End of Session Equipment Utilized During Treatment: Gait belt Activity Tolerance: Patient limited by fatigue Patient left: in bed;with call bell/phone within reach;with bed alarm set;with family/visitor present Nurse Communication:  Mobility status PT Visit Diagnosis: Unsteadiness on feet (R26.81);Muscle weakness (generalized) (M62.81)     Time: 1478-2956 PT Time Calculation (min) (ACUTE ONLY): 50 min  Charges:    $Therapeutic Activity: 23-37 mins PT General Charges $$ ACUTE PT VISIT: 1 Visit                     Kasem Mozer M,PT Acute Rehab Services [phone]    Florencia Hunter 07/29/2023, 1:09 PM

## 2023-07-29 NOTE — Care Management Important Message (Signed)
 Important Message  Patient Details  Name: Chase Clark MRN: [id number] Date of Birth: 1966/01/15   Important Message Given:  Yes - Medicare IM     Wynonia Hedges 07/29/2023, 2:59 PM

## 2023-07-29 NOTE — Plan of Care (Signed)

## 2023-07-29 NOTE — Telephone Encounter (Signed)
 Copied from CRM [id number]. Topic: Referral - Question >> Jul 29, 2023  1:48 PM Aisha D wrote: Reason for CRM: Patient's wife [name] is calling to request a sleep study referral for the patient. [name] stated that the hospital informed her that they couldn't do the referral and to reach out to the providers office. [name] stated that if anyone needs to call for more information to give her a call at [phone].

## 2023-07-29 NOTE — Progress Notes (Signed)
 Pt family would like update on [facility] visit r/t sleeves for his prothesis.

## 2023-07-29 NOTE — Progress Notes (Signed)
   07/29/23 0824  Peritoneal Catheter Left lower abdomen Continuous ambulatory  Placement Date/Time: 08/30/20 1610   Procedural Verification: Site marked with initials  Time out: Correct Patient;Correct Site;Correct Procedure  Person Inserting LDA: Dr. Mauri Sous  Catheter Location: Left lower abdomen  Catheter Conversion: Extension...  Site Assessment Clean, Dry, Intact  Drainage Description None  Catheter status Deaccessed  Dressing Gauze/Drain sponge  Dressing Status Clean, Dry, Intact  Dressing Intervention Assessed, no intervention needed  Completion  Treatment Status Complete  Initial Drain Volume 16  Average Dwell Time-Hour(s) 1  Average Dwell Time-Min(s) 30  Average Drain Time 31  Total Therapy Volume 96045  Total Therapy Time-Hour(s) 11  Total Therapy Time-Min(s) 40  Weight after Drain 243 lb 2.7 oz (110.3 kg)  Effluent Appearance Amber  Fluid Balance - CCPD  Total UF (- value on cycler, pt gain) 114 mL  Procedure Comments  Tolerated treatment well? Yes  Education / Care Plan  Dialysis Education Provided Yes  Hand-off documentation  Hand-off Given Given to shift RN/LPN  Report given to (Full Name) Albin Altes, LPN

## 2023-07-29 NOTE — Progress Notes (Signed)
 Nutrition Follow-up  DOCUMENTATION CODES:   Obesity unspecified  INTERVENTION:  Continue calorie count.  Liberalize diet to carb modified to give more food choices to increase po intake.  NUTRITION DIAGNOSIS:   Inadequate oral intake related to lethargy/confusion as evidenced by meal completion < 25%. *still applicable  GOAL:   Patient will meet greater than or equal to 90% of their needs *not meeting  MONITOR:   PO intake  REASON FOR ASSESSMENT:   Consult Calorie Count  ASSESSMENT:   PMH: end-stage renal disease on peritoneal dialysis, hypertension, diabetes mellitus, legally blind, PVD, history of CABG and left BKA, amongst multiple medical and cardiac problems. Admitted for acute encephalopathy with unclear etiology.  RD met with pt at bedside, daughter present in room. Pt reports appetite to be down again today. Pt reports stomach pain. Pt's birthday was yesterday so family brought in steaks and sides for dinner which he reports eating all of steak and 75% of sides. Pt also had 100% of lunch tray yesterday consisting of roast beef. Pt had frosted flakes for breakfast this morning but hasn't had anything since. Pt has soup at bedside. Calorie count incomplete with 0 slips in envelope and outside food being brought in. Will f/u tomorrow with attempt to quantify nutritional intake.   Diet Order:   Diet Order             Diet renal/carb modified with fluid restriction Diet-HS Snack? Nothing; Fluid restriction: 1200 mL Fluid; Room service appropriate? Yes; Fluid consistency: Thin  Diet effective now                   EDUCATION NEEDS:   Education needs have been addressed  Skin:  Skin Assessment: Reviewed RN Assessment  Last BM:  PTA  Height:   Ht Readings from Last 1 Encounters:  07/26/23 5\' 10"  (1.778 m)    Weight:   Wt Readings from Last 1 Encounters:  07/29/23 111.5 kg    Ideal Body Weight:     BMI:  Body mass index is 35.27 kg/m.  Estimated  Nutritional Needs:   Kcal:  1600-1900  Protein:  130-150  Fluid:  >1.6L or per MD  Laren Player, MPH, RD, LDN Clinical Dietitian Contact information can be found at [facility].

## 2023-07-29 NOTE — Evaluation (Signed)
 Physical Therapy Evaluation Patient Details Name: Chase Clark MRN: [id number] DOB: [birth year] Today's Date: 07/29/2023  History of Present Illness  Patient is a 58 year old male admitted 4/13 with altered mental status and was admitted for encephalopathy and hypertensive urgency/accelerated hypertension.  Recent start of Baclofen.   Imaging was negative including CT and MRI head.  YQM:VHQIO, end-stage renal disease on peritoneal dialysis, hypertension, diabetes mellitus, legally blind, PVD, history of CABG and left BKA, amongst multiple medical and cardiac problems  Clinical Impression  Pt admitted with above diagnosis. Pt very limited in mobility and needed mod assist with all aspects of mobility today. Prosthesis not fitting as well as significant balance issues in standing needing mod assist. Issued gait belt to pt/wife. Notified MD and CM as to pts status today.  Wife states pt can go home at wheelchair level as well therefore feel hopeful that if PT can see pt again tomorrow it would be better and PT can check prosthetic fit with other socks. Will follow acutely.   Pt currently with functional limitations due to the deficits listed below (see PT Problem List). Pt will benefit from acute skilled PT to increase their independence and safety with mobility to allow discharge.            If plan is discharge home, recommend the following: A little help with walking and/or transfers;A little help with bathing/dressing/bathroom;Assistance with cooking/housework;Help with stairs or ramp for entrance;Assist for transportation;Supervision due to cognitive status   Can travel by private vehicle        Equipment Recommendations Rolling walker (2 wheels) (may need bariatric RW)  Recommendations for Other Services       Functional Status Assessment Patient has had a recent decline in their functional status and demonstrates the ability to make significant improvements in function in a reasonable and  predictable amount of time.     Precautions / Restrictions Precautions Precautions: Fall Required Braces or Orthoses: Other Brace Other Brace: left BK prosthesis Restrictions Weight Bearing Restrictions Per Provider Order: No      Mobility  Bed Mobility Overal bed mobility: Needs Assistance Bed Mobility: Supine to Sit     Supine to sit: Mod assist, Used rails, HOB elevated     General bed mobility comments: Pt having difficulty initiating movement therefore needed mod assist and cues to roll and move to sitting EOB.    Transfers Overall transfer level: Needs assistance Equipment used: Rolling walker (2 wheels) Transfers: Sit to/from Stand Sit to Stand: Mod assist, From elevated surface           General transfer comment: Pt could not don prosthesis. Wife donned for pt.  Prosthetic would not click into place first stand attempt and appeared that sock too large and therefore fit wouldnt work.  Pt needing mod assist to steady in standing due to significant posterior lean.  Pt stood again after removing sock however prosthesis still didnt fit with same assist to stand but did click in a few times and pt was able to side step to [facility] with mod assist and cues and continued posterior lean with instability.    Ambulation/Gait                  Stairs            Wheelchair Mobility     Tilt Bed    Modified Rankin (Stroke Patients Only)       Balance Overall balance assessment: Needs assistance Sitting-balance support: No upper  extremity supported, Feet supported Sitting balance-Leahy Scale: Fair     Standing balance support: Bilateral upper extremity supported, During functional activity, Reliant on assistive device for balance Standing balance-Leahy Scale: Poor Standing balance comment: Pt able to stand to RW needing mod assist for safety due to posterior lean.                             Pertinent Vitals/Pain Pain Assessment Pain  Assessment: Faces Faces Pain Scale: Hurts little more Pain Location: neck Pain Descriptors / Indicators: Grimacing, Guarding, Discomfort Pain Intervention(s): Limited activity within patient's tolerance, Monitored during session, Repositioned    Home Living Family/patient expects to be discharged to:: Private residence Living Arrangements: Spouse/significant other Available Help at Discharge: Family;Available 24 hours/day (wife works from home, daughter and sister can assist) Type of Home: Apartment Home Access: Level entry       Home Layout: One level Home Equipment: Patent examiner (4 wheels);Cane - single point;Grab bars - tub/shower;Hand held shower head;Wheelchair - manual;Shower seat (2 shower seats one inside and one outside shower)      Prior Function Prior Level of Function : Needs assist             Mobility Comments: can don prosthesis and was independent without device, recent use of rollator for one day ADLs Comments: B/D self, wife assisting with washing feet,     Extremity/Trunk Assessment   Upper Extremity Assessment Upper Extremity Assessment: Defer to OT evaluation    Lower Extremity Assessment Lower Extremity Assessment: RLE deficits/detail;LLE deficits/detail RLE Deficits / Details: grossly 3-/5 LLE Deficits / Details: hip and knee 3/5    Cervical / Trunk Assessment Cervical / Trunk Assessment: Kyphotic  Communication   Communication Communication: No apparent difficulties    Cognition Arousal: Alert Behavior During Therapy: Flat affect   PT - Cognitive impairments: Orientation, Awareness, Memory, Sequencing, Problem solving, Safety/Judgement, Initiation, Attention   Orientation impairments: Time                     Following commands: Impaired Following commands impaired: Follows one step commands inconsistently, Follows one step commands with increased time     Cueing Cueing Techniques: Verbal cues, Gestural cues,  Tactile cues     General Comments General comments (skin integrity, edema, etc.): wife and daughter present    Exercises     Assessment/Plan    PT Assessment Patient needs continued PT services  PT Problem List Decreased activity tolerance;Decreased balance;Decreased mobility;Decreased knowledge of use of DME;Decreased safety awareness;Decreased knowledge of precautions;Obesity       PT Treatment Interventions DME instruction;Gait training;Functional mobility training;Therapeutic activities;Therapeutic exercise;Balance training;Patient/family education;Wheelchair mobility training    PT Goals (Current goals can be found in the Care Plan section)  Acute Rehab PT Goals Patient Stated Goal: to go home PT Goal Formulation: With patient Time For Goal Achievement: 08/12/23 Potential to Achieve Goals: Good    Frequency Min 2X/week     Co-evaluation               AM-PAC PT "6 Clicks" Mobility  Outcome Measure Help needed turning from your back to your side while in a flat bed without using bedrails?: A Lot Help needed moving from lying on your back to sitting on the side of a flat bed without using bedrails?: A Lot Help needed moving to and from a bed to a chair (including a wheelchair)?: A Lot Help needed standing up  from a chair using your arms (e.g., wheelchair or bedside chair)?: A Lot Help needed to walk in hospital room?: Total Help needed climbing 3-5 steps with a railing? : Total 6 Click Score: 10    End of Session Equipment Utilized During Treatment: Gait belt Activity Tolerance: Patient limited by fatigue Patient left: in bed;with call bell/phone within reach;with bed alarm set;with family/visitor present Nurse Communication: Mobility status PT Visit Diagnosis: Unsteadiness on feet (R26.81);Muscle weakness (generalized) (M62.81)    Time: 1610-9604 PT Time Calculation (min) (ACUTE ONLY): 50 min   Charges:   PT Evaluation $PT Eval Moderate Complexity: 1  Mod PT Treatments $Therapeutic Activity: 23-37 mins PT General Charges $$ ACUTE PT VISIT: 1 Visit         Staley Lunz M,PT Acute Rehab Services [phone]   Florencia Hunter 07/29/2023, 1:17 PM

## 2023-07-29 NOTE — Progress Notes (Signed)
 [facility] Progress Note  Subjective:  More alert today Working w/ PT in room Sitting up on side of bed  Vitals:   07/28/23 1953 07/29/23 0435 07/29/23 0753 07/29/23 0800  BP: 120/69 116/64  108/71  Pulse: 73 74  76  Resp: 18 18    Temp: 98.7 F (37.1 C) 97.7 F (36.5 C)  99 F (37.2 C)  TempSrc: Oral Oral  Oral  SpO2: 98% 96%  98%  Weight:   111.5 kg   Height:        Exam:  Alert, no distress  no jvd  Chest cta bilat  Cor reg no RG  Abd soft ntnd no ascites   Ext L BKA, trace edema R LE   Neuro: awake, alert, Ox 2   PD cath in place    OP PD: CCPD, 5 cycles, 2.4 L (12 L overnight) over 9 hours with Extraneal during the day.  7000 units heparin per 6 L dialysate bag.     Assessment/ Plan: Altered mental status: CT negative, was taking baclofen at home after a fall (baclofen causes confusion in esrd pts). Much better. Family knows to avoid this medication in the future.  End-stage renal disease: cont PD nightly. We will hold daytime icodextrin while here.  Hypertension: BP's were high, now low. All 1.5% fluids.  Anemia: Hemoglobin currently at goal, no indication for ESA therapy. Secondary hyperparathyroidism: CCa and phos okay. Follow.   Larry Poag MD  [facility] 07/29/2023, 11:20 AM  Recent Labs  Lab 07/26/23 2025 07/27/23 0329 07/29/23 0840  HGB 14.5 10.4* 9.9*  ALBUMIN  --  2.3* 2.2*  CALCIUM  --  8.4* 7.7*  PHOS 6.1* 6.6*  --   CREATININE 8.21* 8.32* 9.35*  K  --  4.1 3.7   No results for input(s): "IRON", "TIBC", "FERRITIN" in the last 168 hours. Inpatient medications:  amLODipine  10 mg Oral Daily   aspirin EC  81 mg Oral Daily   atorvastatin  80 mg Oral QHS   brimonidine  1 drop Both Eyes Q12H   And   timolol  1 drop Both Eyes Q12H   dorzolamide  1 drop Both Eyes BID   ezetimibe  10 mg Oral Daily   fluticasone  1 spray Each Nare Once   gentamicin cream  1 Application Topical Daily   heparin  5,000 Units Subcutaneous Q8H    insulin aspart  0-6 Units Subcutaneous Q4H   irbesartan  75 mg Oral QHS   latanoprost  1 drop Both Eyes q AM   metoCLOPramide  10 mg Oral QHS   pantoprazole (PROTONIX) IV  40 mg Intravenous Q12H   senna  2 tablet Oral Daily   tamsulosin  0.4 mg Oral Daily   torsemide  40 mg Oral 2 times per day    dialysis solution 1.5% low-MG/low-CA     acetaminophen, heparin sodium (porcine) 2,000 Units in dialysis solution 1.5% low-MG/low-CA 2,000 mL dialysis solution, heparin sodium (porcine) 6,000 Units in dialysis solution 1.5% low-MG/low-CA 6,000 mL dialysis solution, hydrALAZINE

## 2023-07-29 NOTE — Telephone Encounter (Signed)
 Pt was a Dr. Lovetta Rucks pt. Pt will be transferring care to Dr. Casimir Cleaver but has not seen her yet. Pt's wife would like to see if a sleep study order can be placed, she stated that they would not order it at the hospital.

## 2023-07-29 NOTE — Inpatient Diabetes Management (Signed)
 Inpatient Diabetes Program Recommendations  AACE/ADA: New Consensus Statement on Inpatient Glycemic Control   Target Ranges:  Prepandial:   less than 140 mg/dL      Peak postprandial:   less than 180 mg/dL (1-2 hours)      Critically ill patients:  140 - 180 mg/dL    Latest Reference Range & Units 07/28/23 03:44 07/28/23 11:54 07/28/23 16:37 07/28/23 19:57 07/28/23 23:49 07/29/23 04:38 07/29/23 08:02  Glucose-Capillary 70 - 99 mg/dL 147 (H) 829 (H) 562 (H) 216 (H) 204 (H) 190 (H) 180 (H)   Review of Glycemic Control  Diabetes history: DM1 Outpatient Diabetes medications: Tresiba 42 units BID, Humalog 15 units TID with meals; NPH 70 units QHS Current orders for Inpatient glycemic control: Novolog 0-6 units Q4H  Inpatient Diabetes Program Recommendations:    Insulin: Please consider ordering Semglee 7 units Q24H.  Thanks, Beacher Limerick, RN, MSN, CDCES Diabetes Coordinator Inpatient Diabetes Program [phone] (Team Pager from 8am to 5pm)

## 2023-07-29 NOTE — Progress Notes (Addendum)
 PROGRESS NOTE  Chase Clark ZOX:[id number] DOB: 01/27/1966 DOA: 07/26/2023 PCP: No primary care provider on file.   LOS: 3 days   Brief narrative:   Patient is a 58 year old male, obese, with past medical history significant for end-stage renal disease on peritoneal dialysis, hypertension, diabetes mellitus, legally blind, PVD, history of CABG and left BKA, amongst multiple medical and cardiac problems presented to hospital with altered mental status and was admitted for encephalopathy and hypertensive urgency/accelerated hypertension.  Imaging was negative including CT and MRI head.  Patient was noted to be hypoglycemic but no obvious signs of infection.  Initially was on D10 for hypoglycemia.  Patient was then admitted hospital for further evaluation and treatment.    Assessment/Plan: Principal Problem:   Acute encephalopathy Active Problems:   Hyperlipidemia associated with type 2 diabetes mellitus (HCC)   Hypertension associated with diabetes (HCC)   Obesity (BMI 30-39.9)   PAD (peripheral artery disease) (HCC)   ESRD (end stage renal disease) (HCC)   Metabolic encephalopathy   Acute metabolic encephalopathy: Likely secondary to baclofen induced.  Other possibilities include hypoglycemia, accelerated hypertension, undiagnosed sleep apnea/ obesity hypoventilation syndrome.  Family states that after taking baclofen symptoms started.  At this time we will continue to monitor.  Hemoglobin A1c was 4.9.  Diabetic coordinator on board.  Patient does have a ESRD.  CT head and MRI of the brain was negative for acute findings.  No signs of infection at this time.  Urine drug screen was negative and ammonia was less than 30.  Alcohol of less than 10.- A1c of 4.9%.  Follow-up with pulmonary team on discharge.  Continue peritoneal dialysis.  Blood cultures negative in 3 days.  No leukocytosis .HIV was nonreactive.  Vitamin B12 at 676 folate at 14.5.  Family still reports some confusion and  hallucination this morning.  Will continue to monitor.  Discussed about sleep apnea study as outpatient.   Hypertensive urgency/accelerated hypertension: Initial systolic blood pressure was 202.  Has been started on amlodipine torsemide has been started and continue irbesartan 75 Mg p.o. nightly.  Have a low threshold to add Coreg.  Gradually improving.  Blood pressure has improved at this time.  Latest blood pressure 108/71  ESRD on peritoneal dialysis: Nephrology on board for continuation of peritoneal dialysis   Likely undiagnosed OSA/at bedtime: ABG showed pCO2 50.  Will benefit from a sleep study as outpatient.  Patient in no respiratory distress.  As per family patient clinically has some signs of sleep apnea.  Discussed about getting outpatient sleep study referral to the family.   Level I Hypoglycemia: Diabetes mellitus type 1. Initially required D10 drip.  Encourage oral nutrition.  Off dextrose supplement at this time.   Grade 2 obesity. Body mass index is 35.97 kg/m.  Would benefit from weight loss as outpatient.   Anemia with MCV of 103: - B12 is 676. - Folate is 14.5.     Fatty liver: Will need to follow-up as outpatient.   Coronary artery disease: -History of CABG. no active issues  Hypomagnesemia.  Magnesium 1.3 today.  Will give 2 g of IV magnesium sulfate.   PVD: -Status post left BKA. DVT prophylaxis: heparin injection 5,000 Units Start: 07/26/23 2200   Disposition: Likely home on 07/30/2023.  Will need PT evaluation.  Status is: Inpatient Remains inpatient appropriate because: Altered mental status, ESRD requiring peritoneal dialysis, pending clinical improvement, ongoing hallucinations    Code Status:     Code Status: Full Code  Family Communication: Spoke with the patient's spouse and daughter at bedside.  Consultants: Nephrology  Procedures: Peritoneal dialysis Anti-infectives:  None  Anti-infectives (From admission, onward)    None         Subjective: Today, patient was seen and examined at bedside.  Family at bedside finally had some sleep this morning.  Had been confused pulling on things in doing something with his hands this morning seem to be an hallucination.  Objective: Vitals:   07/29/23 0435 07/29/23 0800  BP: 116/64 108/71  Pulse: 74 76  Resp: 18   Temp: 97.7 F (36.5 C) 99 F (37.2 C)  SpO2: 96% 98%    Intake/Output Summary (Last 24 hours) at 07/29/2023 1246 Last data filed at 07/29/2023 0824 Gross per 24 hour  Intake 114 ml  Output --  Net 114 ml   Filed Weights   07/27/23 1900 07/28/23 0713 07/29/23 0753  Weight: 113.7 kg 110.3 kg 111.5 kg   Body mass index is 35.27 kg/m.   Physical Exam:  GENERAL: Patient is somnolent at this time.  Legally blind, obese, HENT: No scleral pallor or icterus. Pupils equally reactive to light. Oral mucosa is moist NECK: is supple, no gross swelling noted. CHEST: Clear to auscultation. No crackles or wheezes.  CVS: S1 and S2 heard, no murmur. Regular rate and rhythm.  ABDOMEN: Soft, non-tender, bowel sounds are present.  Peritoneal dialysis catheter in place. EXTREMITIES: No edema. CNS: Somnolent SKIN: warm and dry without rashes.  Data Review: I have personally reviewed the following laboratory data and studies,  CBC: Recent Labs  Lab 07/26/23 1150 07/26/23 1205 07/26/23 1842 07/26/23 2025 07/27/23 0329 07/29/23 0840  WBC 8.8  --   --  5.3 7.8 8.5  NEUTROABS 6.8  --   --   --   --   --   HGB 12.2* 10.5* 13.6 14.5 10.4* 9.9*  HCT 36.7* 31.0* 40.0 45.2 31.8* 29.8*  MCV 103.7*  --   --  102.7* 102.6* 101.4*  PLT 156  --   --  125* 174 157   Basic Metabolic Panel: Recent Labs  Lab 07/26/23 1150 07/26/23 1205 07/26/23 1842 07/26/23 2025 07/27/23 0329 07/29/23 0840  NA 135 132* 137  --  136 134*  K 5.0 8.2* 4.2  --  4.1 3.7  CL 99  --   --   --  102 96*  CO2 22  --   --   --  21* 25  GLUCOSE 83  --   --   --  221* 216*  BUN 48*  --   --    --  49* 47*  CREATININE 7.92*  --   --  8.21* 8.32* 9.35*  CALCIUM 8.6*  --   --   --  8.4* 7.7*  MG  --   --   --  1.6*  --  1.3*  PHOS  --   --   --  6.1* 6.6*  --    Liver Function Tests: Recent Labs  Lab 07/26/23 1150 07/27/23 0329 07/29/23 0840  AST 42*  --  26  ALT 23  --  23  ALKPHOS 97  --  105  BILITOT 0.7  --  0.8  PROT 6.4*  --  5.3*  ALBUMIN 2.6* 2.3* 2.2*   Recent Labs  Lab 07/26/23 1150  LIPASE 39   Recent Labs  Lab 07/26/23 1150  AMMONIA 30   Cardiac Enzymes: No results for input(s): "CKTOTAL", "CKMB", "CKMBINDEX", "  TROPONINI" in the last 168 hours. BNP (last 3 results) Recent Labs    07/26/23 1150  BNP 62.0    ProBNP (last 3 results) No results for input(s): "PROBNP" in the last 8760 hours.  CBG: Recent Labs  Lab 07/28/23 1637 07/28/23 1957 07/28/23 2349 07/29/23 0438 07/29/23 0802  GLUCAP 190* 216* 204* 190* 180*   Recent Results (from the past 240 hours)  Blood Culture (routine x 2)     Status: None (Preliminary result)   Collection Time: 07/26/23 11:45 AM   Specimen: BLOOD RIGHT HAND  Result Value Ref Range Status   Specimen Description BLOOD RIGHT HAND  Final   Special Requests   Final    BOTTLES DRAWN AEROBIC AND ANAEROBIC Blood Culture results may not be optimal due to an inadequate volume of blood received in culture bottles   Culture   Final    NO GROWTH 3 DAYS Performed at [facility], [address] [place], [place] [address]    Report Status PENDING  Incomplete  Blood Culture (routine x 2)     Status: None (Preliminary result)   Collection Time: 07/26/23 11:50 AM   Specimen: BLOOD LEFT HAND  Result Value Ref Range Status   Specimen Description BLOOD LEFT HAND  Final   Special Requests   Final    BOTTLES DRAWN AEROBIC AND ANAEROBIC Blood Culture results may not be optimal due to an inadequate volume of blood received in culture bottles   Culture   Final    NO GROWTH 3 DAYS Performed at [facility]  Lab, [address], [place], [place] [address]    Report Status PENDING  Incomplete     Studies: No results found.     British Moyd, MD  [facility] 07/29/2023  If 7PM-7AM, please contact night-coverage

## 2023-07-30 ENCOUNTER — Ambulatory Visit: Admitting: Nurse Practitioner

## 2023-07-30 DIAGNOSIS — Z992 Dependence on renal dialysis: Secondary | ICD-10-CM | POA: Diagnosis not present

## 2023-07-30 DIAGNOSIS — N186 End stage renal disease: Secondary | ICD-10-CM | POA: Diagnosis not present

## 2023-07-30 DIAGNOSIS — G9340 Encephalopathy, unspecified: Secondary | ICD-10-CM | POA: Diagnosis not present

## 2023-07-30 LAB — GLUCOSE, CAPILLARY
Glucose-Capillary: 186 mg/dL — ABNORMAL HIGH (ref 70–99)
Glucose-Capillary: 210 mg/dL — ABNORMAL HIGH (ref 70–99)
Glucose-Capillary: 215 mg/dL — ABNORMAL HIGH (ref 70–99)
Glucose-Capillary: 236 mg/dL — ABNORMAL HIGH (ref 70–99)

## 2023-07-30 MED ORDER — DELFLEX-LC/2.5% DEXTROSE 394 MOSM/L IP SOLN: INTRAPERITONEAL | Status: DC

## 2023-07-30 MED ORDER — DIPHENHYDRAMINE HCL 25 MG PO CAPS
25.0000 mg | ORAL_CAPSULE | Freq: Once | ORAL | Status: AC
  Administered 2023-07-30: 25 mg via ORAL
  Filled 2023-07-30: qty 1

## 2023-07-30 MED ORDER — AMLODIPINE BESYLATE 10 MG PO TABS: 10.0000 mg | ORAL_TABLET | Freq: Every day | ORAL | 2 refills | Status: DC

## 2023-07-30 MED ORDER — GABAPENTIN 300 MG PO CAPS: 300.0000 mg | ORAL_CAPSULE | Freq: Two times a day (BID) | ORAL | 1 refills | Status: DC

## 2023-07-30 MED ORDER — DELFLEX-LC/1.5% DEXTROSE 344 MOSM/L IP SOLN: INTRAPERITONEAL | Status: DC

## 2023-07-30 NOTE — Plan of Care (Signed)
 Patient AAOx4, legally blind. LBM today. Bladder scan done, patient brought to bathroom with sera steady, urinated. Safety precautions maintained.   Discharge instructions discussed with patient spouse, verbalized understanding. RPIV removed. Patient left in no acute or respiratory distress with all belongings.    Problem: Coping: Goal: Ability to adjust to condition or change in health will improve Outcome: Progressing   Problem: Health Behavior/Discharge Planning: Goal: Ability to manage health-related needs will improve Outcome: Progressing   Problem: Nutritional: Goal: Maintenance of adequate nutrition will improve Outcome: Progressing   Problem: Skin Integrity: Goal: Risk for impaired skin integrity will decrease Outcome: Progressing

## 2023-07-30 NOTE — Progress Notes (Signed)
 Physical Therapy Treatment Patient Details Name: Chase Clark MRN: [id number] DOB: [birth year] Today's Date: 07/30/2023   History of Present Illness Patient is a 58 year old male admitted 4/13 with altered mental status and was admitted for encephalopathy and hypertensive urgency/accelerated hypertension.  Recent start of Baclofen.   Imaging was negative including CT and MRI head.  EXB:MWUXL, end-stage renal disease on peritoneal dialysis, hypertension, diabetes mellitus, legally blind, PVD, history of CABG and left BKA, amongst multiple medical and cardiac problems    PT Comments  Pt received in supine, pleasantly agreeable to therapy session, pt with LLE brace donned prior to therapist arrival and per pt/spouse, representative from [employer] had delivered him a shrinker sock to wear when not wearing brace to reduce edema and gave some tips on donning prosthetic. Pt needing up to minA for sit<>stand to/from lower surface heights and CGA from elevated surfaces to transfer. Pt performs step pivot transfers to [facility] and short distance gait trial at bedside with minA and sat after ~46ft due to c/o lower back discomfort and fatigue. Pt tolerated standing up to ~5 mins while attempting to urinate and did not have success in seated posture on BSC or standing with urinal, RN notified as per care team, he is not able to be discharged until he is making urine. Family members x3 present in his room at end of session and pt agreeable to drink more water, refill water brought to his room for him as well. Pt continues to benefit from PT services to progress toward functional mobility goals.     If plan is discharge home, recommend the following: A little help with walking and/or transfers;A little help with bathing/dressing/bathroom;Assistance with cooking/housework;Help with stairs or ramp for entrance;Assist for transportation;Supervision due to cognitive status   Can travel by private vehicle         Equipment Recommendations  Rolling walker (2 wheels);Other (comment) (rec OP consult to prosthetist to ensure LLE fitting properly and pt has correct sock liners)    Recommendations for Other Services       Precautions / Restrictions Precautions Precautions: Fall Recall of Precautions/Restrictions: Intact Required Braces or Orthoses: Other Brace Other Brace: left BK prosthesis, Scott from [employer] brought shrinker for him to wear when LLE prosthetic is not donned Restrictions Weight Bearing Restrictions Per Provider Order: No     Mobility  Bed Mobility Overal bed mobility: Needs Assistance Bed Mobility: Supine to Sit     Supine to sit: Supervision, HOB elevated, Used rails     General bed mobility comments: Pt has tempur-pedic bed with elevating head and foot areas so HOB elevated per home set-up.    Transfers Overall transfer level: Needs assistance Equipment used: Rolling walker (2 wheels) Transfers: Sit to/from Stand Sit to Stand: Min assist, Contact guard assist           General transfer comment: CGA from bed at height pt and spouse report is typical for home and to/from [facility]; spouse standing by also for safety. Light minA from lower chair height and stand>sit to chair with dense cues for safe UE placement due to visual deficits.    Ambulation/Gait Ambulation/Gait assistance: Min assist, +2 safety/equipment Gait Distance (Feet): 6 Feet (~44ft pivotal steps to BSC, seated break, then ~27ft to chair from [facility]) Assistive device: Rolling walker (2 wheels) Gait Pattern/deviations: Step-to pattern, Step-through pattern, Decreased stride length   Gait velocity interpretation: <1.31 ft/sec, indicative of household ambulator   General Gait Details: small, low steps with  pt needing frequent cues for proximity to RW, pt is not accustomed to using RW but responding well to cues, spouse present and receptive to instruction as well; gait belt for safety, pt spouse  reports they have been given one for home already. Distance limited due to pt c/o back and BLE fatigue after standing to attempt urinating for ~5 mins prior to ambulating.   Stairs Stairs:  (defer, pt has level entry apartment)           Wheelchair Mobility     Tilt Bed    Modified Rankin (Stroke Patients Only)       Balance Overall balance assessment: Needs assistance Sitting-balance support: No upper extremity supported, Feet supported Sitting balance-Leahy Scale: Good     Standing balance support: Bilateral upper extremity supported, During functional activity, Reliant on assistive device for balance Standing balance-Leahy Scale: Poor                              Communication Communication Communication: No apparent difficulties  Cognition Arousal: Alert Behavior During Therapy: WFL for tasks assessed/performed   PT - Cognitive impairments: Sequencing, Problem solving, Initiation, Safety/Judgement                       PT - Cognition Comments: Some slow processing of cues and needs increased cueing for unfamiliar tasks (using RW properly) as pt is not accustomed to using assistive device. Pt following most 1-step commands well. Spouse present in room and able to assist as well. Following commands: Impaired Following commands impaired: Only follows one step commands consistently, Follows multi-step commands inconsistently    Cueing Cueing Techniques: Verbal cues, Gestural cues, Tactile cues  Exercises      General Comments General comments (skin integrity, edema, etc.): Wife present and supportive      Pertinent Vitals/Pain Pain Assessment Pain Assessment: Faces Faces Pain Scale: Hurts little more Pain Location: lower back with prolonged standing >5  mins Pain Descriptors / Indicators: Discomfort, Sore Pain Intervention(s): Limited activity within patient's tolerance, Monitored during session, Repositioned    Home Living  Family/patient expects to be discharged to:: Private residence Living Arrangements: Spouse/significant other Available Help at Discharge: Family;Available 24 hours/day Type of Home: Apartment Home Access: Level entry       Home Layout: One level Home Equipment: Patent examiner (4 wheels);Cane - single point;Grab bars - tub/shower;Hand held shower head;Wheelchair - manual;Shower seat      Prior Function            PT Goals (current goals can now be found in the care plan section) Acute Rehab PT Goals Patient Stated Goal: to go home PT Goal Formulation: With patient Time For Goal Achievement: 08/12/23 Progress towards PT goals: Progressing toward goals    Frequency    Min 2X/week      PT Plan      Co-evaluation              AM-PAC PT "6 Clicks" Mobility   Outcome Measure  Help needed turning from your back to your side while in a flat bed without using bedrails?: A Little Help needed moving from lying on your back to sitting on the side of a flat bed without using bedrails?: A Little Help needed moving to and from a bed to a chair (including a wheelchair)?: A Little Help needed standing up from a chair using your arms (e.g., wheelchair or bedside  chair)?: A Little Help needed to walk in hospital room?: A Lot (anticipate +2 for safety) Help needed climbing 3-5 steps with a railing? : Total 6 Click Score: 15    End of Session Equipment Utilized During Treatment: Gait belt;Other (comment) (LLE prosthetic donned throughout) Activity Tolerance: Patient tolerated treatment well;Patient limited by fatigue Patient left: in chair;with call bell/phone within reach;with family/visitor present;Other (comment) (spouse in room, children arriving at end of session; pt in recliner) Nurse Communication: Mobility status;Other (comment) (pt not yet able to urinate) PT Visit Diagnosis: Unsteadiness on feet (R26.81);Muscle weakness (generalized) (M62.81)     Time:  0865-7846 PT Time Calculation (min) (ACUTE ONLY): 29 min  Charges:    $Gait Training: 8-22 mins $Therapeutic Activity: 8-22 mins PT General Charges $$ ACUTE PT VISIT: 1 Visit                     Kaoir Loree P., PTA Acute Rehabilitation Services Secure Chat Preferred 9a-5:30pm Office: [phone]    Mariel Shope [facility] 07/30/2023, 3:30 PM

## 2023-07-30 NOTE — Discharge Summary (Signed)
 Physician Discharge Summary  Chase Clark BJY:[id number] DOB: [birth year] DOA: 07/26/2023  PCP: No primary care provider on file.  Admit date: 07/26/2023 Discharge date: 07/30/2023  Admitted From: Home  Discharge disposition: Home  Recommendations for Outpatient Follow-Up:   Follow up with your primary care provider in one week.  Check CBC, BMP, magnesium in the next visit Patient has been discontinued off Wellbutrin and baclofen due to confusion and hallucinations. Patient would benefit from having sleep study as outpatient to assess for possible sleep apnea.  Discharge Diagnosis:   Principal Problem:   Acute encephalopathy Active Problems:   Hyperlipidemia associated with type 2 diabetes mellitus (HCC)   Hypertension associated with diabetes (HCC)   Obesity (BMI 30-39.9)   PAD (peripheral artery disease) (HCC)   ESRD (end stage renal disease) (HCC)   Metabolic encephalopathy   Discharge Condition: Improved.  Diet recommendation:  Carbohydrate-modified.    Wound care: None.  Code status: Full.   History of Present Illness:   Patient is a 58 year old male, obese, with past medical history significant for end-stage renal disease on peritoneal dialysis, hypertension, diabetes mellitus, legally blind, PVD, history of CABG and left BKA, amongst multiple medical and cardiac problems presented to hospital with altered mental status and was admitted for encephalopathy and hypertensive urgency/accelerated hypertension.  Imaging was negative including CT and MRI head.  Patient was noted to be hypoglycemic but no obvious signs of infection.  Initially was on D10 for hypoglycemia.  Patient was then admitted hospital for further evaluation and treatment.    Hospital Course:   Following conditions were addressed during hospitalization as listed below,  Acute metabolic encephalopathy: Likely secondary to baclofen and Wellbutrin induced.  Initial possibility that included  hypoglycemia, accelerated hypertension, undiagnosed sleep apnea/ obesity hypoventilation syndrome.  Family states that after taking baclofen symptoms started and he was on Wellbutrin for 2 weeks..   CT head and MRI of the brain was negative for acute findings.  No signs of infection was noted.  Urine drug screen was negative and ammonia was less than 30.  Alcohol of less than 10.- A1c of 4.9%.  Patient was continued on peritoneal dialysis.  Blood cultures were negative.  No leukocytosis .HIV was nonreactive.  Vitamin B12 at 676 folate at 14.5.  Discussed about sleep apnea study as outpatient.  Counseled regarding discontinuation of baclofen and Wellbutrin as outpatient.  Encephalopathy has resolved at this time and patient is alert awake and oriented and at his baseline prior to discharge.  Gabapentin dose has been decreased to 300 mg twice a day at this time.    Hypertensive urgency/accelerated hypertension: Initial systolic blood pressure was 202.  Blood pressure is improved at this time.  Continue amlodipine torsemide.     ESRD on peritoneal dialysis: Nephrology followed the patient during hospitalization for peritoneal dialysis   Likely undiagnosed OSA/at bedtime: ABG showed pCO2 50.  Will benefit from a sleep study as outpatient.  Patient in no respiratory distress.  As per family patient clinically has some signs of sleep apnea.  Discussed about getting outpatient sleep study referral to the family.   Level I Hypoglycemia: Diabetes mellitus type 1. Has resolved.  Will resume insulin regimen from home on discharge.   Grade 2 obesity. Body mass index is 35.97 kg/m.  Would benefit from weight loss as outpatient.   Anemia with MCV of 103: - B12 is 676. - Folate is 14.5.     Fatty liver: Will need to follow-up as outpatient.  Coronary artery disease: -History of CABG. no active issues   Hypomagnesemia.  Magnesium 1.3 on 07/29/2023 and received 2 g of magnesium sulfate.   PVD: -Status  post left BKA.  Disposition.  At this time, patient is stable for disposition home with outpatient PCP follow-up.  Medical Consultants:   Nephrology  Procedures:    None Subjective:   Today, patient was seen and examined at bedside.  Appears to be alert awake and Communicative.  Denies any nausea vomiting fever chills rigors.  At his baseline as per family.  Discharge Exam:   Vitals:   07/30/23 0425 07/30/23 1039  BP: 132/76 138/82  Pulse: 65 72  Resp: 18 18  Temp: 98.4 F (36.9 C) 98.6 F (37 C)  SpO2: 98% 97%   Vitals:   07/29/23 1958 07/30/23 0425 07/30/23 0425 07/30/23 1039  BP: 139/83 132/76 132/76 138/82  Pulse: 76 67 65 72  Resp: 18 18 18 18   Temp: 99.1 F (37.3 C) 98.4 F (36.9 C) 98.4 F (36.9 C) 98.6 F (37 C)  TempSrc: Oral Oral Oral Oral  SpO2: 96%  98% 97%  Weight:      Height:        General: Alert awake, not in obvious distress, legally blind, obese build HENT: pupils equally reacting to light,  No scleral pallor or icterus noted. Oral mucosa is moist.  Chest:   Diminished breath sounds bilaterally. No crackles or wheezes.  CVS: S1 &S2 heard. No murmur.  Regular rate and rhythm. Abdomen: Soft, nontender, nondistended.  Bowel sounds are heard.  Peritoneal dialysis catheter in place. Extremities: No cyanosis, clubbing, left below-knee amputation Psych: Alert, awake and oriented, Communicative, CNS:  No cranial nerve deficits.  Power equal in all extremities.   Skin: Warm and dry.  No rashes noted.  The results of significant diagnostics from this hospitalization (including imaging, microbiology, ancillary and laboratory) are listed below for reference.     Diagnostic Studies:   MR BRAIN WO CONTRAST Result Date: 07/26/2023 CLINICAL DATA:  Mental status change, unknown cause. EXAM: MRI HEAD WITHOUT CONTRAST TECHNIQUE: Multiplanar, multiecho pulse sequences of the brain and surrounding structures were obtained without intravenous contrast.  COMPARISON:  Head CT 07/26/2023 and MRI 10/22/2006 FINDINGS: Brain: There is no evidence of an acute infarct, intracranial hemorrhage, mass, midline shift, or extra-axial fluid collection. Chronic bilateral thalamic lacunar infarcts are new from the remote MRI. There are a few remote small-vessel insults in the cerebral white matter as well. There is mild cerebral atrophy. Vascular: Major intracranial vascular flow voids are preserved. Skull and upper cervical spine: Unremarkable bone marrow signal. Sinuses/Orbits: Chronic bilateral sphenoid, ethmoid, and right maxillary sinusitis with prominent T2 hypointensity in the left sphenoid and right maxillary sinuses potentially reflecting allergic fungal sinusitis. Clear mastoid air cells. Bilateral cataract extraction. Other: None. IMPRESSION: 1. No acute intracranial abnormality. 2. 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 3. Chronic sinusitis. Electronically Signed   By: Sebastian Ache M.D.   On: 07/26/2023 16:52   CT HEAD WO CONTRAST ( ) Result Date: 07/26/2023 CLINICAL DATA:  Mental status change, unknown cause; Neck trauma, intoxicated or obtunded (Age >= 16y) EXAM: CT HEAD WITHOUT CONTRAST CT CERVICAL SPINE WITHOUT CONTRAST TECHNIQUE: Multidetector CT imaging of the head and cervical spine was performed following the standard protocol without intravenous contrast. Multiplanar CT image reconstructions of the cervical spine were also generated. RADIATION DOSE REDUCTION: This exam was performed according to the departmental dose-optimization program which includes automated exposure  control, adjustment of the mA and/or kV according to patient size and/or use of iterative reconstruction technique. COMPARISON:  None Available. FINDINGS: CT HEAD FINDINGS Brain: No evidence of large-territorial acute infarction. No parenchymal hemorrhage. No mass lesion. No extra-axial collection. No mass effect or midline shift. No hydrocephalus. Basilar  cisterns are patent. Vascular: No hyperdense vessel. Atherosclerotic calcifications are present within the cavernous internal carotid and vertebral arteries. Skull: No acute fracture or focal lesion. Sinuses/Orbits: Complete opacification of the bilateral sphenoid sinuses. Almost complete opacification of the right maxillary and bilateral ethmoid sinuses. Curvilinear calcification of the right maxillary sinus suggestive of chronic versus fungal infection. Otherwise remaining paranasal sinuses and mastoid air cells are clear. Bilateral lens replacement. Otherwise the orbits are unremarkable. Other: None. CT CERVICAL SPINE FINDINGS Alignment: Normal. Skull base and vertebrae: C5-C6 anterior cervical discectomy and fusion. Multilevel moderate degenerative changes of the spine. No associated severe osseous neural foraminal or central canal stenosis. No acute fracture. No aggressive appearing focal osseous lesion or focal pathologic process. Soft tissues and spinal canal: No prevertebral fluid or swelling. No visible canal hematoma. Upper chest: Unremarkable. Other: Atherosclerotic plaque of the carotid arteries within the neck. IMPRESSION: 1. No acute intracranial abnormality. 2. No acute displaced fracture or traumatic listhesis of the cervical spine. 3. Sinus disease as above. Electronically Signed   By: Morgane  Naveau M.D.   On: 07/26/2023 14:22   CT Cervical Spine Wo Contrast Result Date: 07/26/2023 CLINICAL DATA:  Mental status change, unknown cause; Neck trauma, intoxicated or obtunded (Age >= 16y) EXAM: CT HEAD WITHOUT CONTRAST CT CERVICAL SPINE WITHOUT CONTRAST TECHNIQUE: Multidetector CT imaging of the head and cervical spine was performed following the standard protocol without intravenous contrast. Multiplanar CT image reconstructions of the cervical spine were also generated. RADIATION DOSE REDUCTION: This exam was performed according to the departmental dose-optimization program which includes automated  exposure control, adjustment of the mA and/or kV according to patient size and/or use of iterative reconstruction technique. COMPARISON:  None Available. FINDINGS: CT HEAD FINDINGS Brain: No evidence of large-territorial acute infarction. No parenchymal hemorrhage. No mass lesion. No extra-axial collection. No mass effect or midline shift. No hydrocephalus. Basilar cisterns are patent. Vascular: No hyperdense vessel. Atherosclerotic calcifications are present within the cavernous internal carotid and vertebral arteries. Skull: No acute fracture or focal lesion. Sinuses/Orbits: Complete opacification of the bilateral sphenoid sinuses. Almost complete opacification of the right maxillary and bilateral ethmoid sinuses. Curvilinear calcification of the right maxillary sinus suggestive of chronic versus fungal infection. Otherwise remaining paranasal sinuses and mastoid air cells are clear. Bilateral lens replacement. Otherwise the orbits are unremarkable. Other: None. CT CERVICAL SPINE FINDINGS Alignment: Normal. Skull base and vertebrae: C5-C6 anterior cervical discectomy and fusion. Multilevel moderate degenerative changes of the spine. No associated severe osseous neural foraminal or central canal stenosis. No acute fracture. No aggressive appearing focal osseous lesion or focal pathologic process. Soft tissues and spinal canal: No prevertebral fluid or swelling. No visible canal hematoma. Upper chest: Unremarkable. Other: Atherosclerotic plaque of the carotid arteries within the neck. IMPRESSION: 1. No acute intracranial abnormality. 2. No acute displaced fracture or traumatic listhesis of the cervical spine. 3. Sinus disease as above. Electronically Signed   By: Morgane  Naveau M.D.   On: 07/26/2023 14:22   DG Chest Port 1 View Result Date: 07/26/2023 CLINICAL DATA:  Altered mental status. EXAM: PORTABLE CHEST 1 VIEW COMPARISON:  08/10/2020 FINDINGS: The cardio pericardial silhouette is enlarged. Low lung  volumes. The lungs are clear  without focal pneumonia, edema, pneumothorax or pleural effusion. No acute bony abnormality. Telemetry leads overlie the chest. IMPRESSION: Low volume film without acute cardiopulmonary findings. Electronically Signed   By: Donnal Fusi M.D.   On: 07/26/2023 11:29     Labs:   Basic Metabolic Panel: Recent Labs  Lab 07/26/23 1150 07/26/23 1205 07/26/23 1842 07/26/23 2025 07/27/23 0329 07/29/23 0840  NA 135 132* 137  --  136 134*  K 5.0 8.2* 4.2  --  4.1 3.7  CL 99  --   --   --  102 96*  CO2 22  --   --   --  21* 25  GLUCOSE 83  --   --   --  221* 216*  BUN 48*  --   --   --  49* 47*  CREATININE 7.92*  --   --  8.21* 8.32* 9.35*  CALCIUM 8.6*  --   --   --  8.4* 7.7*  MG  --   --   --  1.6*  --  1.3*  PHOS  --   --   --  6.1* 6.6*  --    GFR Estimated Creatinine Clearance: 10.5 mL/min (A) (by C-G formula based on SCr of 9.35 mg/dL (H)). Liver Function Tests: Recent Labs  Lab 07/26/23 1150 07/27/23 0329 07/29/23 0840  AST 42*  --  26  ALT 23  --  23  ALKPHOS 97  --  105  BILITOT 0.7  --  0.8  PROT 6.4*  --  5.3*  ALBUMIN 2.6* 2.3* 2.2*   Recent Labs  Lab 07/26/23 1150  LIPASE 39   Recent Labs  Lab 07/26/23 1150  AMMONIA 30   Coagulation profile Recent Labs  Lab 07/26/23 1150  INR 1.2    CBC: Recent Labs  Lab 07/26/23 1150 07/26/23 1205 07/26/23 1842 07/26/23 2025 07/27/23 0329 07/29/23 0840  WBC 8.8  --   --  5.3 7.8 8.5  NEUTROABS 6.8  --   --   --   --   --   HGB 12.2* 10.5* 13.6 14.5 10.4* 9.9*  HCT 36.7* 31.0* 40.0 45.2 31.8* 29.8*  MCV 103.7*  --   --  102.7* 102.6* 101.4*  PLT 156  --   --  125* 174 157   Cardiac Enzymes: No results for input(s): "CKTOTAL", "CKMB", "CKMBINDEX", "TROPONINI" in the last 168 hours. BNP: Invalid input(s): "POCBNP" CBG: Recent Labs  Lab 07/29/23 2006 07/30/23 0000 07/30/23 0427 07/30/23 0818 07/30/23 1201  GLUCAP 168* 215* 210* 186* 236*   D-Dimer No results for  input(s): "DDIMER" in the last 72 hours. Hgb A1c No results for input(s): "HGBA1C" in the last 72 hours. Lipid Profile No results for input(s): "CHOL", "HDL", "LDLCALC", "TRIG", "CHOLHDL", "LDLDIRECT" in the last 72 hours. Thyroid function studies No results for input(s): "TSH", "T4TOTAL", "T3FREE", "THYROIDAB" in the last 72 hours.  Invalid input(s): "FREET3" Anemia work up No results for input(s): "VITAMINB12", "FOLATE", "FERRITIN", "TIBC", "IRON", "RETICCTPCT" in the last 72 hours. Microbiology Recent Results (from the past 240 hours)  Blood Culture (routine x 2)     Status: None (Preliminary result)   Collection Time: 07/26/23 11:45 AM   Specimen: BLOOD RIGHT HAND  Result Value Ref Range Status   Specimen Description BLOOD RIGHT HAND  Final   Special Requests   Final    BOTTLES DRAWN AEROBIC AND ANAEROBIC Blood Culture results may not be optimal due to an inadequate volume of blood received  in culture bottles   Culture   Final    NO GROWTH 4 DAYS Performed at [facility], [address] [place], [place] [address]    Report Status PENDING  Incomplete  Blood Culture (routine x 2)     Status: None (Preliminary result)   Collection Time: 07/26/23 11:50 AM   Specimen: BLOOD LEFT HAND  Result Value Ref Range Status   Specimen Description BLOOD LEFT HAND  Final   Special Requests   Final    BOTTLES DRAWN AEROBIC AND ANAEROBIC Blood Culture results may not be optimal due to an inadequate volume of blood received in culture bottles   Culture   Final    NO GROWTH 4 DAYS Performed at [facility], [address], [place], [place] [address]    Report Status PENDING  Incomplete     Discharge Instructions:   Discharge Instructions     Call MD for:  severe uncontrolled pain   Complete by: As directed    Call MD for:  temperature >100.4   Complete by: As directed    Diet Carb Modified   Complete by: As directed    Discharge instructions   Complete by: As directed     Follow-up with your primary care provider in 1 week.  Check blood work at that time.  Seek medical attention for worsening symptoms.  Please do not take the Wellbutrin and baclofen, discussed with your primary care provider about it.  Discuss about getting a sleep study as outpatient for possible sleep apnea.  Continue peritoneal dialysis at home.   Discharge wound care:   Complete by: As directed    Local site care   Increase activity slowly   Complete by: As directed       Allergies as of 07/30/2023       Reactions   Baclofen Other (See Comments)   Disorientation, possibly        Medication List     STOP taking these medications    amoxicillin 500 MG capsule Commonly known as: AMOXIL   buPROPion 150 MG 24 hr tablet Commonly known as: WELLBUTRIN XL   cephALEXin 500 MG capsule Commonly known as: KEFLEX   mupirocin ointment 2 % Commonly known as: BACTROBAN       TAKE these medications    acetaminophen 325 MG tablet Commonly known as: TYLENOL Take 1 tablet (325 mg total) by mouth every 6 (six) hours as needed for mild pain (pain score 1-3 or temp > 100.5).   amLODipine 10 MG tablet Commonly known as: NORVASC Take 1 tablet (10 mg total) by mouth daily.   aspirin 81 MG chewable tablet Commonly known as: Aspirin Childrens Chew 1 tablet (81 mg total) by mouth daily.   atorvastatin 80 MG tablet Commonly known as: LIPITOR TAKE 1 TABLET BY MOUTH EVERY DAY What changed: when to take this   azelastine 0.1 % nasal spray Commonly known as: ASTELIN Place 2 sprays into both nostrils 2 (two) times daily. Use in each nostril as directed   brimonidine-timolol 0.2-0.5 % ophthalmic solution Commonly known as: COMBIGAN Place 1 drop into both eyes in the morning and at bedtime.   Dexcom G7 Sensor Misc USE 1 DEVICE AS DIRECTED What changed: See the new instructions.   dorzolamide 2 % ophthalmic solution Commonly known as: TRUSOPT Place 1 drop into both eyes 2 (two)  times daily.   esomeprazole 20 MG capsule Commonly known as: NEXIUM Take 20 mg by  mouth daily before breakfast.   ezetimibe 10 MG tablet Commonly known as: ZETIA Take 1 tablet (10 mg total) by mouth daily. What changed: when to take this   Flomax 0.4 MG Caps capsule Generic drug: tamsulosin Take 0.4 mg by mouth at bedtime.   fluticasone 50 MCG/ACT nasal spray Commonly known as: FLONASE SPRAY 2 SPRAYS INTO EACH NOSTRIL EVERY DAY What changed: See the new instructions.   gabapentin 300 MG capsule Commonly known as: NEURONTIN Take 1 capsule (300 mg total) by mouth 2 (two) times daily. What changed:  how much to take when to take this   gentamicin cream 0.1 % Commonly known as: GARAMYCIN Apply 1 application  topically See admin instructions. Apply around port site as directed   HumuLIN N KwikPen 100 UNIT/ML KwikPen Generic drug: Insulin NPH (Human) (Isophane) Inject 70 Units into the skin daily in the afternoon. To be taken at the start of dialysis What changed:  when to take this additional instructions   insulin lispro 100 UNIT/ML KwikPen Commonly known as: HumaLOG KwikPen Max daily 45 units What changed:  how much to take how to take this when to take this additional instructions   Insulin Pen Needle 32G X 4 MM Misc Inject 1 Device into the skin 5 (five) times daily. Use to inject Lantus and Novolog up to 5 times daily   Insulin Syringe-Needle U-100 28G X 1/2" 1 ML Misc 1 Device by Does not apply route daily in the afternoon.   Klor-Con M20 20 MEQ tablet Generic drug: potassium chloride SA Take 20 mEq by mouth in the morning.   lactulose 10 GM/15ML solution Commonly known as: CHRONULAC TAKE 30 MLS (20 G TOTAL) BY MOUTH AS NEEDED. What changed: See the new instructions.   latanoprost 0.005 % ophthalmic solution Commonly known as: XALATAN Place 1 drop into both eyes in the morning.   metoCLOPramide 10 MG tablet Commonly known as: REGLAN Take 1 tablet  (10 mg total) by mouth at bedtime.   midodrine 5 MG tablet Commonly known as: PROAMATINE Take 5 mg by mouth 2 (two) times daily as needed (if the Systolic number is less than 100).   mometasone 0.1 % cream Commonly known as: ELOCON Apply 1 Application topically as directed. Qd to bid to aa right ankle, right lower leg until improved, then prn flares   MULTIVITAMIN ADULT PO Take 1 tablet by mouth daily.   ondansetron 4 MG disintegrating tablet Commonly known as: ZOFRAN-ODT Take 4 mg by mouth every 8 (eight) hours as needed for nausea or vomiting (dissolve orally).   senna 8.6 MG tablet Commonly known as: SENOKOT Take 2 tablets by mouth in the morning.   Testosterone 20.25 MG/ACT (1.62%) Gel Place 2 Pump onto the skin daily in the afternoon.   torsemide 20 MG tablet Commonly known as: DEMADEX Take 20-40 mg by mouth See admin instructions. Take 40 mg by mouth in the morning and 40 mg at lunchtime   Tresiba FlexTouch 200 UNIT/ML FlexTouch Pen Generic drug: insulin degludec Inject 42 Units into the skin in the morning and at bedtime.   Vitamin D (Ergocalciferol) 1.25 MG (50000 UNIT) Caps capsule Commonly known as: DRISDOL Take 50,000 Units by mouth every 30 (thirty) days.   Vitamin D3 1000 units Caps Take 1,000 Units by mouth in the morning.               Durable Medical Equipment  (From admission, onward)  Start     Ordered   07/30/23 1304  For home use only DME Walker rolling  Once       Question Answer Comment  Walker: With 5 Inch Wheels   Patient needs a walker to treat with the following condition Weakness      07/30/23 1303   07/29/23 1037  For home use only DME Walker rolling  Once       Question Answer Comment  Walker: With 5 Inch Wheels   Patient needs a walker to treat with the following condition Weakness      07/29/23 1037              Discharge Care Instructions  (From admission, onward)           Start     Ordered    07/30/23 0000  Discharge wound care:       Comments: Local site care   07/30/23 1117            Follow-up Information     Primary care provider Follow up in 1 week(s).          [employer] Follow up.   Why: Company for your Dispensing optician information: [address] [place] [address] [phone]                  Time coordinating discharge: 39 minutes  Signed:  Jaclynne Baldo  [facility] 07/30/2023, 5:52 PM

## 2023-07-30 NOTE — Plan of Care (Signed)

## 2023-07-30 NOTE — TOC Progression Note (Addendum)
 Transition of Care [facility]) - Progression Note    Patient Details  Name: Chase Clark MRN: [id number] Date of Birth: 03/28/1966  [facility]) CM/SW Contact  Ronni Colace, RN Phone Number: 07/30/2023, 12:47 PM  Clinical Narrative:     Spoke to patient and wife at the bedside. Introduced self and explained role. Their prime worry is to make sure prosthetic is going to work properly. She bought a Museum/gallery exhibitions officer last week, but he did not like it, she is returning it. Would like a 2 wheel walker, it will be delivered to room, she has no preference  on agency.  Called Courtenay Distel at [employer].  Discussed home health and the general routine of what they do. Her church friend has home health and is pleased with them She will call this RNCM with the agency. If they do not accept then she wants to work with who works best with his insurance.   [facility] [employer] states they now have to go through [place] for [facility] medicare patients they have to wait for them to approve prior to them bringing it up. OT and PT  They are in room working on fit for prosthesis.  1530 PT has worked with paitent( see note) wife states she will call with [facility] decision. RN aware about walker authorization and that it will be delivered to home. He does have a rollator ( which he does not like) but will have to utilize until walker Siegfried [employer] is approved.    Expected Discharge Plan and Services    Today or tomorrow     Expected Discharge Date: 07/30/23                                     Social Determinants of Health (SDOH) Interventions SDOH Screenings   Food Insecurity: No Food Insecurity (07/26/2023)  Housing: Low Risk  (07/26/2023)  Transportation Needs: No Transportation Needs (07/26/2023)  Utilities: Not At Risk (07/26/2023)  Depression (PHQ2-9): Low Risk  (09/16/2022)  Financial Resource Strain: Low Risk  (12/26/2020)  Social Connections: Unknown (02/26/2022)   Received from [facility], [facility]  Stress: No Stress  Concern Present (12/21/2019)  Tobacco Use: Medium Risk (07/26/2023)    Readmission Risk Interventions     No data to display

## 2023-07-30 NOTE — Progress Notes (Signed)
 [facility] Progress Note  Subjective:  Stable today  Vitals:   07/29/23 1958 07/30/23 0425 07/30/23 0425 07/30/23 1039  BP: 139/83 132/76 132/76 138/82  Pulse: 76 67 65 72  Resp: 18 18 18 18   Temp: 99.1 F (37.3 C) 98.4 F (36.9 C) 98.4 F (36.9 C) 98.6 F (37 C)  TempSrc: Oral Oral Oral Oral  SpO2: 96%  98% 97%  Weight:      Height:        Exam:  Alert, no distress  no jvd  Chest cta bilat  Cor reg no RG  Abd soft ntnd no ascites   Ext L BKA, trace edema R LE   Neuro: awake, alert, Ox 2   PD cath in place    OP PD: CCPD, 5 cycles, 2.4 L (12 L overnight) over 9 hours with Extraneal during the day.  7000 units heparin per 6 L dialysate bag.     Assessment/ Plan: Altered mental status: CT negative, was taking baclofen at home after a fall (baclofen causes confusion in esrd pts). Improving. Family knows to avoid this medication in the future.  End-stage renal disease: cont PD nightly. We will hold daytime icodextrin while here.  Hypertension: BP's good today. Use mix of 1.5% and 2.5% fluids.  Anemia: Hemoglobin currently at goal, no indication for ESA therapy. Secondary hyperparathyroidism: CCa and phos okay. Follow.  Dispo: per primary team. Stable from renal standpoint.   Larry Poag MD  [facility] 07/30/2023, 11:03 AM  Recent Labs  Lab 07/26/23 2025 07/27/23 0329 07/29/23 0840  HGB 14.5 10.4* 9.9*  ALBUMIN  --  2.3* 2.2*  CALCIUM  --  8.4* 7.7*  PHOS 6.1* 6.6*  --   CREATININE 8.21* 8.32* 9.35*  K  --  4.1 3.7   No results for input(s): "IRON", "TIBC", "FERRITIN" in the last 168 hours. Inpatient medications:  amLODipine  10 mg Oral Daily   aspirin EC  81 mg Oral Daily   atorvastatin  80 mg Oral QHS   brimonidine  1 drop Both Eyes Q12H   And   timolol  1 drop Both Eyes Q12H   dorzolamide  1 drop Both Eyes BID   ezetimibe  10 mg Oral Daily   fluticasone  1 spray Each Nare Once   gentamicin cream  1 Application Topical Daily   heparin   5,000 Units Subcutaneous Q8H   insulin aspart  0-6 Units Subcutaneous Q4H   irbesartan  75 mg Oral QHS   latanoprost  1 drop Both Eyes q AM   metoCLOPramide  10 mg Oral QHS   pantoprazole (PROTONIX) IV  40 mg Intravenous Q12H   senna  2 tablet Oral Daily   tamsulosin  0.4 mg Oral Daily   torsemide  40 mg Oral 2 times per day    dialysis solution 1.5% low-MG/low-CA     acetaminophen, heparin sodium (porcine) 2,000 Units in dialysis solution 1.5% low-MG/low-CA 2,000 mL dialysis solution, heparin sodium (porcine) 6,000 Units in dialysis solution 1.5% low-MG/low-CA 6,000 mL dialysis solution, hydrALAZINE

## 2023-07-30 NOTE — Progress Notes (Signed)
   07/29/23 2020  Peritoneal Catheter Left lower abdomen Continuous ambulatory  Placement Date/Time: 08/30/20 2956   Procedural Verification: Site marked with initials  Time out: Correct Patient;Correct Site;Correct Procedure  Person Inserting LDA: Dr. Mauri Sous  Catheter Location: Left lower abdomen  Catheter Conversion: Extension...  Site Assessment Clean, Dry, Intact  Drainage Description None  Catheter status Accessed  Dressing Gauze/Drain sponge  Dressing Status Clean, Dry, Intact  Dressing Intervention New dressing/dressing changed  Cycler Setup  Total Number of Night Cycles 5  Night Fill Volume 2400  Dianeal Solution Dextrose 1.5% in 6000 mL Low Cal/Low Mag  Dianeal Additive Heparin  Night Dwell Time per Cycle - Hour(s) 1  Night Dwell Time per Cycle - Minute(s) 30  Night Time Therapy - Minute(s) 18  Night Time Therapy - Hour(s) 10  Minimum Initial Drain Volume 0  Maximum Peritoneal Volume 3600  Night/Total Therapy Volume 21308  Day Exchange No  Completion  Treatment Status Started  Hand-off documentation  Hand-off Received Received from shift RN/LPN  Report received from (Full Name) Statistician   PD tx initation note:   Pre TX VS: 99.1 139/83 76 18 96% RA  Pre TX weight: 111.5KG  PD treatment initiated via aseptic technique. Consent signed and in chart. Patient is alert and oriented. No complaints of pain. No specimen collected. PD exit site clean, dry and intact. Gentamycin and new dressing applied. Bedside RN educated on PD machine and how to contact tech support when PD machine alarms.

## 2023-07-30 NOTE — Inpatient Diabetes Management (Signed)
 Inpatient Diabetes Program Recommendations  AACE/ADA: New Consensus Statement on Inpatient Glycemic Control   Target Ranges:  Prepandial:   less than 140 mg/dL      Peak postprandial:   less than 180 mg/dL (1-2 hours)      Critically ill patients:  140 - 180 mg/dL    Latest Reference Range & Units 07/29/23 04:38 07/29/23 08:02 07/29/23 15:12 07/29/23 20:06 07/30/23 00:00 07/30/23 04:27  Glucose-Capillary 70 - 99 mg/dL 161 (H) 096 (H) 045 (H) 168 (H) 215 (H) 210 (H)   Review of Glycemic Control  Diabetes history: DM1 Outpatient Diabetes medications: Tresiba 42 units BID, Humalog 15 units TID with meals; NPH 70 units QHS Current orders for Inpatient glycemic control: Novolog 0-6 units Q4H   Inpatient Diabetes Program Recommendations:     Insulin: Please consider ordering Semglee 7 units Q24H.   Thanks, Beacher Limerick, RN, MSN, CDCES Diabetes Coordinator Inpatient Diabetes Program [phone] (Team Pager from 8am to 5pm)

## 2023-07-30 NOTE — Evaluation (Signed)
 Occupational Therapy Evaluation Patient Details Name: Chase Clark MRN: [id number] DOB: Sep 11, 1965 Today's Date: 07/30/2023   History of Present Illness   Patient is a 58 year old male admitted 4/13 with altered mental status and was admitted for encephalopathy and hypertensive urgency/accelerated hypertension.  Recent start of Baclofen.   Imaging was negative including CT and MRI head.  WUJ:WJXBJ, end-stage renal disease on peritoneal dialysis, hypertension, diabetes mellitus, legally blind, PVD, history of CABG and left BKA, amongst multiple medical and cardiac problems     Clinical Impressions PTA, pt lives with spouse in first floor apartment, typically able to manage most ADLs and mobility with Modified Independence w/ wife assisting as needed. Pt presents now with minor deficits in balance, strength and cognition. Residual limb remains slightly swollen so prosthetic LE unable to properly fit for gait attempts. Pt able to manage transfers using RW with CGA and ADLs with Min A. Wife present and supportive- reports able to assist pt if prosthetic LE can securely fit. Educated re: elevation of residual limb, gait belt use, tub bench options and provided handouts to reflect education. Contacted [employer] for new sock to hopefully allow prosthetic LE an optimal fit for safe DC home w/ HH therapy services.      If plan is discharge home, recommend the following:   A little help with walking and/or transfers;A little help with bathing/dressing/bathroom;Assistance with cooking/housework;Direct supervision/assist for financial management;Direct supervision/assist for medications management;Supervision due to cognitive status     Functional Status Assessment   Patient has had a recent decline in their functional status and demonstrates the ability to make significant improvements in function in a reasonable and predictable amount of time.     Equipment Recommendations   Other (comment)  (RW)     Recommendations for Other Services         Precautions/Restrictions   Precautions Precautions: Fall Required Braces or Orthoses: Other Brace Other Brace: left BK prosthesis Restrictions Weight Bearing Restrictions Per Provider Order: No     Mobility Bed Mobility Overal bed mobility: Needs Assistance Bed Mobility: Supine to Sit, Sit to Supine     Supine to sit: Supervision, HOB elevated, Used rails Sit to supine: Supervision        Transfers Overall transfer level: Needs assistance Equipment used: Rolling walker (2 wheels) Transfers: Sit to/from Stand Sit to Stand: Min assist, Contact guard assist           General transfer comment: Min A from lower bed with RW, CGA from higher bed      Balance Overall balance assessment: Needs assistance Sitting-balance support: No upper extremity supported, Feet supported Sitting balance-Leahy Scale: Good     Standing balance support: Bilateral upper extremity supported, During functional activity, Reliant on assistive device for balance Standing balance-Leahy Scale: Poor                             ADL either performed or assessed with clinical judgement   ADL Overall ADL's : Needs assistance/impaired Eating/Feeding: Minimal assistance;Bed level Eating/Feeding Details (indicate cue type and reason): wife assisting some with lunch d/t low vision Grooming: Minimal assistance;Sitting   Upper Body Bathing: Minimal assistance;Sitting   Lower Body Bathing: Minimal assistance;Sitting/lateral leans;Sit to/from stand   Upper Body Dressing : Minimal assistance   Lower Body Dressing: Minimal assistance;Sit to/from stand;Sitting/lateral leans       Toileting- Clothing Manipulation and Hygiene: Minimal assistance;Sitting/lateral lean;Sit to/from stand  Functional mobility during ADLs: Contact guard assist;Rolling walker (2 wheels) General ADL Comments: emphasis on attempting to don prosthetic  but did not click/secure in place fully, pt able to stand and transfer but did not progress gait without prosthetic secure. Educated and respositioned to elevate to decrease swelling with knee in extension. Educated on tub bench as an option if current shower transfer method becomes difficult. Provided handouts for tub bench, energy conservation and fall prevention. Contacted [employer] for new shrinker sock prior to DC home today - MD and CM aware     Vision Baseline Vision/History: 2 Legally blind Ability to See in Adequate Light: 3 Highly impaired Patient Visual Report: No change from baseline Vision Assessment?: Vision impaired- to be further tested in functional context Additional Comments: legally blind at baseline. can see outlines/shadows but not faces, details     Perception         Praxis         Pertinent Vitals/Pain Pain Assessment Pain Assessment: No/denies pain     Extremity/Trunk Assessment Upper Extremity Assessment Upper Extremity Assessment: Overall WFL for tasks assessed;Right hand dominant   Lower Extremity Assessment Lower Extremity Assessment: Defer to PT evaluation   Cervical / Trunk Assessment Cervical / Trunk Assessment: Kyphotic   Communication Communication Communication: No apparent difficulties   Cognition Arousal: Alert Behavior During Therapy: WFL for tasks assessed/performed Cognition: Cognition impaired     Awareness: Intellectual awareness intact, Online awareness impaired (improving) Memory impairment (select all impairments): Declarative long-term memory Attention impairment (select first level of impairment): Alternating attention, Selective attention Executive functioning impairment (select all impairments): Sequencing OT - Cognition Comments: minor deficits in awareness, sequencing and safety cues beneficial. pleasant, quick wit                 Following commands: Impaired Following commands impaired: Only follows one step  commands consistently, Follows multi-step commands inconsistently     Cueing  General Comments   Cueing Techniques: Verbal cues;Gestural cues;Tactile cues  Wife present and supportive   Exercises     Shoulder Instructions      Home Living Family/patient expects to be discharged to:: Private residence Living Arrangements: Spouse/significant other Available Help at Discharge: Family;Available 24 hours/day Type of Home: Apartment Home Access: Level entry     Home Layout: One level     Bathroom Shower/Tub: Chief Strategy Officer: Standard     Home Equipment: Patent examiner (4 wheels);Cane - single point;Grab bars - tub/shower;Hand held shower head;Wheelchair - manual;Shower seat          Prior Functioning/Environment Prior Level of Function : Needs assist             Mobility Comments: can don prosthesis and was independent without device, recent use of rollator for one day. hx of 2 recent falls (one at a curb) ADLs Comments: B/D self. Uses two shower chairs - one outside tub and one inside tub to transfer. wife assisting with washing feet, IADLs.    OT Problem List: Decreased strength;Decreased activity tolerance;Impaired balance (sitting and/or standing);Decreased cognition;Decreased safety awareness;Decreased knowledge of use of DME or AE   OT Treatment/Interventions: Self-care/ADL training;Therapeutic exercise;Energy conservation;DME and/or AE instruction;Therapeutic activities;Patient/family education;Balance training      OT Goals(Current goals can be found in the care plan section)   Acute Rehab OT Goals Patient Stated Goal: for prosthetic LE to fit in order for pt to walk and wife to provide optimal pt care OT Goal Formulation: With patient/family Time For  Goal Achievement: 08/13/23 Potential to Achieve Goals: Good   OT Frequency:  Min 2X/week    Co-evaluation              AM-PAC OT "6 Clicks" Daily Activity     Outcome  Measure Help from another person eating meals?: A Little Help from another person taking care of personal grooming?: A Little Help from another person toileting, which includes using toliet, bedpan, or urinal?: A Little Help from another person bathing (including washing, rinsing, drying)?: A Little Help from another person to put on and taking off regular upper body clothing?: A Little Help from another person to put on and taking off regular lower body clothing?: A Little 6 Click Score: 18   End of Session Equipment Utilized During Treatment: Gait belt;Rolling walker (2 wheels) Nurse Communication: Mobility status  Activity Tolerance: Patient tolerated treatment well Patient left: in bed;with bed alarm set;with call bell/phone within reach;with family/visitor present  OT Visit Diagnosis: Unsteadiness on feet (R26.81);Other abnormalities of gait and mobility (R26.89);Muscle weakness (generalized) (M62.81)                Time: 8119-1478 OT Time Calculation (min): 43 min Charges:  OT General Charges $OT Visit: 1 Visit OT Evaluation $OT Eval Moderate Complexity: 1 Mod OT Treatments $Self Care/Home Management : 8-22 mins $Therapeutic Activity: 8-22 mins  Lawrence Pretty, OTR/L Acute Rehab Services Office: [phone]   Annabella Barr 07/30/2023, 12:46 PM

## 2023-07-31 ENCOUNTER — Other Ambulatory Visit: Payer: Self-pay | Admitting: Internal Medicine

## 2023-07-31 DIAGNOSIS — N186 End stage renal disease: Secondary | ICD-10-CM | POA: Diagnosis not present

## 2023-07-31 DIAGNOSIS — Z992 Dependence on renal dialysis: Secondary | ICD-10-CM | POA: Diagnosis not present

## 2023-07-31 LAB — CULTURE, BLOOD (ROUTINE X 2)
Culture: NO GROWTH
Culture: NO GROWTH

## 2023-08-01 DIAGNOSIS — Z992 Dependence on renal dialysis: Secondary | ICD-10-CM | POA: Diagnosis not present

## 2023-08-01 DIAGNOSIS — N186 End stage renal disease: Secondary | ICD-10-CM | POA: Diagnosis not present

## 2023-08-02 DIAGNOSIS — N186 End stage renal disease: Secondary | ICD-10-CM | POA: Diagnosis not present

## 2023-08-02 DIAGNOSIS — Z992 Dependence on renal dialysis: Secondary | ICD-10-CM | POA: Diagnosis not present

## 2023-08-03 DIAGNOSIS — N186 End stage renal disease: Secondary | ICD-10-CM | POA: Diagnosis not present

## 2023-08-03 DIAGNOSIS — Z992 Dependence on renal dialysis: Secondary | ICD-10-CM | POA: Diagnosis not present

## 2023-08-04 DIAGNOSIS — Z992 Dependence on renal dialysis: Secondary | ICD-10-CM | POA: Diagnosis not present

## 2023-08-04 DIAGNOSIS — N186 End stage renal disease: Secondary | ICD-10-CM | POA: Diagnosis not present

## 2023-08-05 DIAGNOSIS — N186 End stage renal disease: Secondary | ICD-10-CM | POA: Diagnosis not present

## 2023-08-05 DIAGNOSIS — Z992 Dependence on renal dialysis: Secondary | ICD-10-CM | POA: Diagnosis not present

## 2023-08-06 ENCOUNTER — Ambulatory Visit (INDEPENDENT_AMBULATORY_CARE_PROVIDER_SITE_OTHER): Admitting: Nurse Practitioner

## 2023-08-06 ENCOUNTER — Encounter: Payer: Self-pay | Admitting: Nurse Practitioner

## 2023-08-06 VITALS — BP 128/82 | HR 78 | Temp 97.9°F | Ht 70.0 in

## 2023-08-06 DIAGNOSIS — R0683 Snoring: Secondary | ICD-10-CM

## 2023-08-06 DIAGNOSIS — Z09 Encounter for follow-up examination after completed treatment for conditions other than malignant neoplasm: Secondary | ICD-10-CM | POA: Insufficient documentation

## 2023-08-06 DIAGNOSIS — R2681 Unsteadiness on feet: Secondary | ICD-10-CM

## 2023-08-06 DIAGNOSIS — R7989 Other specified abnormal findings of blood chemistry: Secondary | ICD-10-CM | POA: Diagnosis not present

## 2023-08-06 DIAGNOSIS — Z992 Dependence on renal dialysis: Secondary | ICD-10-CM | POA: Diagnosis not present

## 2023-08-06 DIAGNOSIS — N186 End stage renal disease: Secondary | ICD-10-CM | POA: Diagnosis not present

## 2023-08-06 DIAGNOSIS — E1169 Type 2 diabetes mellitus with other specified complication: Secondary | ICD-10-CM

## 2023-08-06 MED ORDER — ATORVASTATIN CALCIUM 80 MG PO TABS: 80.0000 mg | ORAL_TABLET | Freq: Every day | ORAL | 1 refills | Status: DC

## 2023-08-06 NOTE — Assessment & Plan Note (Signed)
 Suspected sleep apnea due to snoring, breathing pauses, and daytime sleepiness. - Refer to pulmonology for a sleep study.

## 2023-08-06 NOTE — Progress Notes (Unsigned)
 Established Patient Office Visit  Subjective:  Patient ID: Chase Clark, male    DOB: [birth year]  Age: 58 y.o. MRN: [id number]  CC:  Chief Complaint  Patient presents with   Hospitalization Follow-up  Discussed the use of a AI scribe software for clinical note transcription with the patient, who gave verbal consent to proceed.   HPI  Chase Clark with h/o ESRD on peritoneal dialysis, hypertension, DM, legally blind, PVD, h/o CABG and left BKA presents for hospital follow up with his wife on wheel chair. He was hospitalized on 4/13 to 4/17 at [facility] due to encephalopathy likely secondary to baclofen and Wellbutrin.  CT and MRI negative for acute changes.   They have discontinued baclofen and wellbutrin and reduced gabapentin  300 mg twice a day.   During his hospital stay, he was noted to have elevated blood pressure and was started on amlodipine . However, upon returning home, his blood pressure dropped significantly to 71/54 and wife states she has not picked amlodipine  and taking it.   Current blood pressure readings vary, with a recent morning reading of 123/80 and an evening reading of 160/75. He is not currently taking amlodipine  and monitors his blood pressure three times a day.  He has been on peritoneal dialysis for about a year and a half. There is a concern about his hemoglobin levels, which have fluctuated, with recent hospital labs showing a hemoglobin of 10.5. He receives iron supplementation as needed during his nephrology visits.  He has been using a prosthesis for three years without issues until recently, when he began experiencing balance problems. He has fallen twice and is now learning to use a walker, which he has never used before. Would like home health referral sent to [facility] for gait training and walker use.  He experiences sleep disturbances, including snoring and pauses in breathing at night, which have worsened over time. He often naps in the afternoon due  to fatigue. Would like to get sleep study ordered.   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  HPI   Past Medical History:  Diagnosis Date   Anemia    Arthritis    Asthma    as child   Atypical mole 09/22/2022   R spinal mid back, needs excision   Barrett's esophagus 02/16/2014   short segment   Cataract    Chronic kidney disease (CKD), stage IV (severe) (HCC)    Coronary artery disease    DDD (degenerative disc disease), cervical    Diabetes mellitus without complication (HCC)    Diabetic osteomyelitis (HCC)    Diabetic peripheral neuropathy (HCC)    Diabetic ulcer of foot with muscle involvement without evidence of necrosis (HCC)    DIABETIC ULCERATIONS ASSOCIATED WITH IRRITATION LATERAL ANKLE LEFT GREATER THAN RIGHT WITH MILD CELLULITIS    Diverticulosis    DKA (diabetic ketoacidoses) 08/23/2017   Edema, lower extremity    Fatty liver    Gastroparesis 2015   GERD (gastroesophageal reflux disease)    Gilbert's disease    Glaucoma    Hx of BKA, left (HCC)    Hx of CABG 01/27/2019   2 vessels; LIMA-LAD, SVG-D1   Hx of heart artery stent 11/20/2009   80% D1 stenosis - 2.5 x 18 mm Xience V Everolimus (DES) stent x 1 placed  Hyperlipidemia    Hypertension    Left below-knee amputee (HCC)    Legally blind    Myocardial infarction [facility]) 11/2009   Neuropathy    Osteoarthritis of spine    Osteomyelitis of left foot (HCC)    Peritoneal dialysis catheter in place [facility])    Does dialysis over night.   PONV (postoperative nausea and vomiting)    PVD (peripheral vascular disease) (HCC)    Seasonal allergies    Shoulder pain    Tubular adenoma of colon    Ulcer of foot (HCC) 11/17/2012   DIABETIC ULCERATIONS ASSOCIATED WITH IRRITATION LATERAL ANKLE LEFT GREATER THAN RIGHT WITH MILD CELLULITIS    Past Surgical  History:  Procedure Laterality Date   ABDOMINAL AORTOGRAM N/A 05/20/2016   Procedure: Abdominal Aortogram possible intervention;  Surgeon: Jackquelyn Mass, MD;  Location: [facility] INVASIVE CV LAB;  Service: Cardiovascular;  Laterality: N/A;   AMPUTATION Left 09/05/2017   Procedure: AMPUTATION BELOW KNEE;  Surgeon: Sammye Cristal, MD;  Location: [facility] OR;  Service: Orthopedics;  Laterality: Left;   ANTERIOR CERVICAL DECOMP/DISCECTOMY FUSION N/A 05/07/2017   Procedure: ANTERIOR CERVICAL DECOMPRESSION/DISCECTOMY Remer Cargo PROSTHESIS,PLATE/SCREWS CERVICAL FIVE - CERVICAL SIX;  Surgeon: Garry Kansas, MD;  Location: [facility] OR;  Service: Neurosurgery;  Laterality: N/A;   CAPD INSERTION N/A 08/30/2020   Procedure: LAPAROSCOPIC INSERTION CONTINUOUS AMBULATORY PERITONEAL DIALYSIS  (CAPD) CATHETER;  Surgeon: Emmalene Hare, MD;  Location: [facility] ORS;  Service: General;  Laterality: N/A;   CATARACT EXTRACTION W/ INTRAOCULAR LENS IMPLANT     CATARACT EXTRACTION W/PHACO Left 02/26/2017   Procedure: CATARACT EXTRACTION PHACO AND INTRAOCULAR LENS PLACEMENT (IOC);  Surgeon: Rosa College, MD;  Location: [facility] ORS;  Service: Ophthalmology;  Laterality: Left;  Lot # [id number] H US : 00:25.3 AP%: 6.2 CDE: 1.59    CHOLECYSTECTOMY N/A    COLONOSCOPY WITH PROPOFOL  N/A 05/30/2021   Procedure: COLONOSCOPY WITH PROPOFOL ;  Surgeon: Nannette Babe, MD;  Location: [facility] ENDOSCOPY;  Service: Gastroenterology;  Laterality: N/A;   CORONARY ANGIOPLASTY WITH STENT PLACEMENT  11/20/2009   80% D1 stenosis - 2.5 x 18 mm Xience V Everolimus (DES) stent x 1 placed; LOCATION: [facility]; SURGEON: Starlette Ebbs, MD   CORONARY ARTERY BYPASS GRAFT N/A 01/27/2019   Procedure: OFF PUMP CORONARY ARTERY BYPASS GRAFTING (CABG) X 2 WITH ENDOSCOPIC HARVESTING OF RIGHT GREATER SAPHENOUS VEIN. LIMA TO LAD;  Surgeon: Hilarie Lovely, MD;  Location[facility] OR;  Service: Open Heart Surgery;  Laterality: N/A;   CORONARY STENT INTERVENTION N/A 01/21/2019    Procedure: CORONARY STENT INTERVENTION;  Surgeon: Arty Binning, MD;  Location: [facility] INVASIVE CV LAB;  Service: Cardiovascular;  Laterality: N/A;   EPIBLEPHERON REPAIR WITH TEAR DUCT PROBING     EYE SURGERY Right 02/09/2014   cataract extraction   INSERTION EXPRESS TUBE SHUNT Right 09/06/2015   Procedure: INSERTION AHMED TUBE SHUNT with tutoplast allograft;  Surgeon: Rosa College, MD;  Location: [facility] ORS;  Service: Ophthalmology;  Laterality: Right;   laparoscopic insertion continuous peritoneal dialysis  2022   LEFT HEART CATH AND CORONARY ANGIOGRAPHY N/A 01/20/2019   Procedure: LEFT HEART CATH AND CORONARY ANGIOGRAPHY;  Surgeon: Avanell Leigh, MD;  Location: [facility]VAS[facility] LAB;  Service: Cardiovascular;  Laterality: N/A;   POLYPECTOMY  05/30/2021   Procedure: POLYPECTOMY;  Surgeon: Nannette Babe, MD;  Location: [facility];  Service: Gastroenterology;;   TEE WITHOUT CARDIOVERSION N/A 01/27/2019   Procedure: TRANSESOPHAGEAL ECHOCARDIOGRAM (TEE);  Surgeon: Hilarie Lovely, MD;  Location: [facility] OR;  Service: Open Heart Surgery;  Laterality: N/A;   VASECTOMY      Family History  Problem Relation Age of Onset   Hyperlipidemia Mother    Diabetes Mother    Liver disease Mother        NASH   Other Mother        immune thrombocytopenic pupura (ITP)   Heart attack Father    Hyperlipidemia Father    Diabetes Father    Hypertension Father    Lymphoma Father    Hemochromatosis Other        Grandmother (? which side)   Polycythemia Other    Colon cancer Neg Hx    Esophageal cancer Neg Hx    Rectal cancer Neg Hx    Stomach cancer Neg Hx     Social History   Socioeconomic History   Marital status: Married    Spouse name: Not on file   Number of children: Not on file   Years of education: Not on file   Highest education level: Not on file  Occupational History   Not on file  Tobacco Use   Smoking status: Former    Current packs/day: 0.00    Average packs/day: 0.5  packs/day for 20.0 years (10.0 ttl pk-yrs)    Types: Cigarettes    Start date: 03/14/1988    Quit date: 03/14/2008    Years since quitting: 15.4   Smokeless tobacco: Never  Vaping Use   Vaping status: Never Used  Substance and Sexual Activity   Alcohol use: Not Currently    Comment: RARELY   Drug use: No   Sexual activity: Not on file  Other Topics Concern   Not on file  Social History Narrative   Used to work as Radiation protection practitioner    Married    Social Drivers of Corporate investment banker Strain: Low Risk  (12/26/2020)   Overall Financial Resource Strain (CARDIA)    Difficulty of Paying Living Expenses: Not hard at all  Food Insecurity: No Food Insecurity (07/26/2023)   Hunger Vital Sign    Worried About Running Out of Food in the Last Year: Never true    Ran Out of Food in the Last Year: Never true  Transportation Needs: No Transportation Needs (07/26/2023)   PRAPARE - Administrator, Civil Service (Medical): No    Lack of Transportation (Non-Medical): No  Physical Activity: Not on file  Stress: No Stress Concern Present (12/21/2019)   [employer] of Occupational Health - Occupational Stress Questionnaire    Feeling of Stress : Not at all  Social Connections: Unknown (02/26/2022)   Received from [facility]   Social Network    Social Network: Not on file  Intimate Partner Violence: Patient Unable To Answer (07/27/2023)   Humiliation, Afraid, Rape, and Kick questionnaire    Fear of Current or Ex-Partner: Patient unable to answer    Emotionally Abused: Patient unable to answer    Physically Abused: Patient unable to answer    Sexually Abused: Patient unable to answer     Outpatient Medications Prior to Visit  Medication Sig Dispense Refill   acetaminophen  (TYLENOL ) 325 MG tablet Take 1 tablet (325 mg total) by mouth every 6 (six) hours as needed for mild pain (pain score 1-3 or temp > 100.5). 30 tablet 0   aspirin  (ASPIRIN  CHILDRENS) 81 MG  chewable tablet Chew 1 tablet (81 mg total) by mouth daily. 30 tablet 1   azelastine  (ASTELIN ) 0.1 %  nasal spray Place 2 sprays into both nostrils 2 (two) times daily. Use in each nostril as directed 30 mL 12   brimonidine -timolol  (COMBIGAN ) 0.2-0.5 % ophthalmic solution Place 1 drop into both eyes in the morning and at bedtime.     Cholecalciferol (VITAMIN D3) 1000 units CAPS Take 1,000 Units by mouth in the morning.     Continuous Glucose Sensor (DEXCOM G7 SENSOR) MISC USE 1 DEVICE AS DIRECTED (Patient taking differently: Inject 1 Device into the skin See admin instructions. Place 1 new sensor into the skin every 10 days) 9 each 3   dorzolamide  (TRUSOPT ) 2 % ophthalmic solution Place 1 drop into both eyes 2 (two) times daily.     esomeprazole  (NEXIUM ) 20 MG capsule Take 20 mg by mouth daily before breakfast.     ezetimibe  (ZETIA ) 10 MG tablet Take 1 tablet (10 mg total) by mouth at bedtime. 90 tablet 0   FLOMAX  0.4 MG CAPS capsule Take 0.4 mg by mouth at bedtime.     fluticasone  (FLONASE ) 50 MCG/ACT nasal spray SPRAY 2 SPRAYS INTO EACH NOSTRIL EVERY DAY (Patient taking differently: Place 2 sprays into both nostrils daily as needed for allergies or rhinitis.) 48 mL 2   gabapentin  (NEURONTIN ) 300 MG capsule Take 1 capsule (300 mg total) by mouth 2 (two) times daily. 60 capsule 1   gentamicin  cream (GARAMYCIN ) 0.1 % Apply 1 application  topically See admin instructions. Apply around port site as directed     insulin  degludec (TRESIBA  FLEXTOUCH) 200 UNIT/ML FlexTouch Pen Inject 42 Units into the skin in the morning and at bedtime. 30 mL 3   insulin  lispro (HUMALOG  KWIKPEN) 100 UNIT/ML KwikPen Max daily 45 units (Patient taking differently: Inject 15 Units into the skin See admin instructions. Inject 15 units into the skin three times a day with meals, per sliding scale- max of 45 units/day) 45 mL 3   Insulin  NPH, Human,, Isophane, (HUMULIN  N KWIKPEN) 100 UNIT/ML Kiwkpen Inject 70 Units into the skin  daily in the afternoon. To be taken at the start of dialysis (Patient taking differently: Inject 70 Units into the skin See admin instructions. Inject 70 units into the skin at bedtime- at the start of dialysis) 70 mL 4   Insulin  Pen Needle 32G X 4 MM MISC Inject 1 Device into the skin 5 (five) times daily. Use to inject Lantus  and Novolog  up to 5 times daily 500 each 3   KLOR-CON  M20 20 MEQ tablet Take 20 mEq by mouth in the morning.     lactulose  (CHRONULAC ) 10 GM/15ML solution TAKE 30 MLS (20 G TOTAL) BY MOUTH AS NEEDED. (Patient taking differently: Take 20 g by mouth daily as needed for mild constipation.) 946 mL 0   latanoprost  (XALATAN ) 0.005 % ophthalmic solution Place 1 drop into both eyes in the morning.     metoCLOPramide  (REGLAN ) 10 MG tablet Take 1 tablet (10 mg total) by mouth at bedtime. 30 tablet 11   midodrine (PROAMATINE) 5 MG tablet Take 5 mg by mouth 2 (two) times daily as needed (if the Systolic number is less than 100).     mometasone  (ELOCON ) 0.1 % cream Apply 1 Application topically as directed. Qd to bid to aa right ankle, right lower leg until improved, then prn flares (Patient taking differently: Apply 1 Application topically See admin instructions. Apply to affected area of the ankle and lower leg 1-2 times a day until improvement is seen, then as needed for flares) 45 g 2  Multiple Vitamin (MULTIVITAMIN ADULT PO) Take 1 tablet by mouth daily.     ondansetron  (ZOFRAN -ODT) 4 MG disintegrating tablet Take 4 mg by mouth every 8 (eight) hours as needed for nausea or vomiting (dissolve orally).     senna (SENOKOT) 8.6 MG tablet Take 2 tablets by mouth in the morning.     Testosterone  20.25 MG/ACT (1.62%) GEL Place 2 Pump onto the skin daily in the afternoon. 75 g 5   torsemide  (DEMADEX ) 20 MG tablet Take 20-40 mg by mouth See admin instructions. Take 40 mg by mouth in the morning and 40 mg at lunchtime     Vitamin D, Ergocalciferol, (DRISDOL) 1.25 MG (50000 UNIT) CAPS capsule Take  50,000 Units by mouth every 30 (thirty) days.     Insulin  Syringe-Needle U-100 28G X 1/2" 1 ML MISC 1 Device by Does not apply route daily in the afternoon. 100 each 3   amLODipine  (NORVASC ) 10 MG tablet Take 1 tablet (10 mg total) by mouth daily. (Patient not taking: Reported on 08/06/2023) 30 tablet 2   atorvastatin  (LIPITOR ) 80 MG tablet TAKE 1 TABLET BY MOUTH EVERY DAY 90 tablet 1   No facility-administered medications prior to visit.    Allergies  Allergen Reactions   Baclofen Other (See Comments)    Disorientation, possibly    ROS Review of Systems Negative unless indicated in HPI.    Objective:    Physical Exam Constitutional:      Appearance: Normal appearance.     Comments: On wheel chair  Cardiovascular:     Rate and Rhythm: Normal rate and regular rhythm.     Pulses: Normal pulses.     Heart sounds: Normal heart sounds.  Pulmonary:     Effort: Pulmonary effort is normal.     Breath sounds: Normal breath sounds.  Musculoskeletal:     Right lower leg: No edema.     Left lower leg: No edema.  Neurological:     General: No focal deficit present.     Mental Status: He is alert and oriented to person, place, and time.  Psychiatric:        Mood and Affect: Mood normal.        Behavior: Behavior normal.     BP 128/82   Pulse 78   Temp 97.9 F (36.6 C)   Ht 5\' 10"  (1.778 m)   SpO2 99%   BMI 35.27 kg/m  Wt Readings from Last 3 Encounters:  07/29/23 245 lb 13 oz (111.5 kg)  05/15/23 255 lb (115.7 kg)  10/06/22 268 lb (121.6 kg)     Health Maintenance  Topic Date Due   Medicare Annual Wellness (AWV)  Never done   Hepatitis C Screening  Never done   OPHTHALMOLOGY EXAM  12/16/2014   Colonoscopy  05/30/2022   COVID-19 Vaccine (6 - 2024-25 season) 12/14/2022   FOOT EXAM  07/03/2023   INFLUENZA VACCINE  11/13/2023   HEMOGLOBIN A1C  01/25/2024   DTaP/Tdap/Td (3 - Td or Tdap) 12/29/2027   Pneumococcal Vaccine 28-37 Years old  Completed   HIV Screening   Completed   Zoster Vaccines- Shingrix  Completed   HPV VACCINES  Aged Out   Meningococcal B Vaccine  Aged Out   Lung Cancer Screening  Discontinued    There are no preventive care reminders to display for this patient.  Lab Results  Component Value Date   TSH 3.59 12/06/2021   Lab Results  Component Value Date   WBC 9.7  08/06/2023   HGB 11.1 (L) 08/06/2023   HCT 33.1 (L) 08/06/2023   MCV 100.7 (H) 08/06/2023   PLT 222.0 08/06/2023   Lab Results  Component Value Date   NA 134 (L) 07/29/2023   K 3.7 07/29/2023   CO2 25 07/29/2023   GLUCOSE 216 (H) 07/29/2023   BUN 47 (H) 07/29/2023   CREATININE 9.35 (H) 07/29/2023   BILITOT 0.8 07/29/2023   ALKPHOS 105 07/29/2023   AST 26 07/29/2023   ALT 23 07/29/2023   PROT 5.3 (L) 07/29/2023   ALBUMIN  2.2 (L) 07/29/2023   CALCIUM  7.7 (L) 07/29/2023   ANIONGAP 13 07/29/2023   GFR 17.50 (L) 12/06/2021   Lab Results  Component Value Date   CHOL 124 09/16/2022   Lab Results  Component Value Date   HDL 27.40 (L) 09/16/2022   Lab Results  Component Value Date   LDLCALC 55 05/24/2019   Lab Results  Component Value Date   TRIG 365.0 (H) 09/16/2022   Lab Results  Component Value Date   CHOLHDL 5 09/16/2022   Lab Results  Component Value Date   HGBA1C 4.9 07/26/2023      Assessment & Plan:  Gait instability Assessment & Plan: Gait instability with falls, BKA left leg and legally blind, requires gait training and walker use. - Refer to [facility] for gait training and walker use.  Orders: -     Ambulatory referral to Home Health  Abnormal CBC -     CBC  Hypomagnesemia -     Magnesium   Snores Assessment & Plan: Suspected sleep apnea due to snoring, breathing pauses, and daytime sleepiness. - Refer to pulmonology for a sleep study.  Orders: -     Pulmonary Visit  Other orders -     Atorvastatin  Calcium ; Take 1 tablet (80 mg total) by mouth at bedtime.  Dispense: 30 tablet; Refill:  1    Follow-up: No follow-ups on file.   Javarus Dorner, NP

## 2023-08-06 NOTE — Assessment & Plan Note (Signed)
 Gait instability with falls, BKA left leg and legally blind, requires gait training and walker use. - Refer to [facility] for gait training and walker use.

## 2023-08-07 ENCOUNTER — Encounter: Payer: Self-pay | Admitting: Nurse Practitioner

## 2023-08-07 DIAGNOSIS — N186 End stage renal disease: Secondary | ICD-10-CM | POA: Diagnosis not present

## 2023-08-07 DIAGNOSIS — Z992 Dependence on renal dialysis: Secondary | ICD-10-CM | POA: Diagnosis not present

## 2023-08-07 LAB — CBC
HCT: 33.1 % — ABNORMAL LOW (ref 39.0–52.0)
Hemoglobin: 11.1 g/dL — ABNORMAL LOW (ref 13.0–17.0)
MCHC: 33.5 g/dL (ref 30.0–36.0)
MCV: 100.7 fl — ABNORMAL HIGH (ref 78.0–100.0)
Platelets: 222 10*3/uL (ref 150.0–400.0)
RBC: 3.29 Mil/uL — ABNORMAL LOW (ref 4.22–5.81)
RDW: 14.5 % (ref 11.5–15.5)
WBC: 9.7 10*3/uL (ref 4.0–10.5)

## 2023-08-07 LAB — MAGNESIUM: Magnesium: 1.4 mg/dL — ABNORMAL LOW (ref 1.5–2.5)

## 2023-08-08 DIAGNOSIS — Z992 Dependence on renal dialysis: Secondary | ICD-10-CM | POA: Diagnosis not present

## 2023-08-08 DIAGNOSIS — N186 End stage renal disease: Secondary | ICD-10-CM | POA: Diagnosis not present

## 2023-08-09 DIAGNOSIS — Z992 Dependence on renal dialysis: Secondary | ICD-10-CM | POA: Diagnosis not present

## 2023-08-09 DIAGNOSIS — N186 End stage renal disease: Secondary | ICD-10-CM | POA: Diagnosis not present

## 2023-08-10 ENCOUNTER — Telehealth: Payer: Self-pay

## 2023-08-10 DIAGNOSIS — Z992 Dependence on renal dialysis: Secondary | ICD-10-CM | POA: Diagnosis not present

## 2023-08-10 DIAGNOSIS — E1022 Type 1 diabetes mellitus with diabetic chronic kidney disease: Secondary | ICD-10-CM | POA: Diagnosis not present

## 2023-08-10 DIAGNOSIS — D509 Iron deficiency anemia, unspecified: Secondary | ICD-10-CM | POA: Diagnosis not present

## 2023-08-10 DIAGNOSIS — H548 Legal blindness, as defined in USA: Secondary | ICD-10-CM | POA: Diagnosis not present

## 2023-08-10 DIAGNOSIS — G9341 Metabolic encephalopathy: Secondary | ICD-10-CM | POA: Diagnosis not present

## 2023-08-10 DIAGNOSIS — D631 Anemia in chronic kidney disease: Secondary | ICD-10-CM | POA: Diagnosis not present

## 2023-08-10 DIAGNOSIS — N186 End stage renal disease: Secondary | ICD-10-CM | POA: Diagnosis not present

## 2023-08-10 NOTE — Telephone Encounter (Signed)
 Copied from CRM [id number]. Topic: General - Other >> Aug 10, 2023  2:18 PM Dorisann Garre T wrote: Reason for CRM: monique from [facility] pt is calling in requesting a verbal order for patient for two times a week for three weeks and one time a week for three weeks cb number [phone]

## 2023-08-10 NOTE — Telephone Encounter (Signed)
Okay to provide verbal order

## 2023-08-11 DIAGNOSIS — N186 End stage renal disease: Secondary | ICD-10-CM | POA: Diagnosis not present

## 2023-08-11 DIAGNOSIS — Z992 Dependence on renal dialysis: Secondary | ICD-10-CM | POA: Diagnosis not present

## 2023-08-11 NOTE — Telephone Encounter (Signed)
 Spoke with Sharri Dee from [facility] to provider verbal orders per NP Deborra Falter. Monique verbalized understanding.

## 2023-08-12 DIAGNOSIS — Z992 Dependence on renal dialysis: Secondary | ICD-10-CM | POA: Diagnosis not present

## 2023-08-12 DIAGNOSIS — N186 End stage renal disease: Secondary | ICD-10-CM | POA: Diagnosis not present

## 2023-08-13 DIAGNOSIS — D631 Anemia in chronic kidney disease: Secondary | ICD-10-CM | POA: Diagnosis not present

## 2023-08-13 DIAGNOSIS — D509 Iron deficiency anemia, unspecified: Secondary | ICD-10-CM | POA: Diagnosis not present

## 2023-08-13 DIAGNOSIS — Z992 Dependence on renal dialysis: Secondary | ICD-10-CM | POA: Diagnosis not present

## 2023-08-13 DIAGNOSIS — H548 Legal blindness, as defined in USA: Secondary | ICD-10-CM | POA: Diagnosis not present

## 2023-08-13 DIAGNOSIS — G9341 Metabolic encephalopathy: Secondary | ICD-10-CM | POA: Diagnosis not present

## 2023-08-13 DIAGNOSIS — N186 End stage renal disease: Secondary | ICD-10-CM | POA: Diagnosis not present

## 2023-08-13 DIAGNOSIS — E1022 Type 1 diabetes mellitus with diabetic chronic kidney disease: Secondary | ICD-10-CM | POA: Diagnosis not present

## 2023-08-14 DIAGNOSIS — Z992 Dependence on renal dialysis: Secondary | ICD-10-CM | POA: Diagnosis not present

## 2023-08-14 DIAGNOSIS — N186 End stage renal disease: Secondary | ICD-10-CM | POA: Diagnosis not present

## 2023-08-15 DIAGNOSIS — Z992 Dependence on renal dialysis: Secondary | ICD-10-CM | POA: Diagnosis not present

## 2023-08-15 DIAGNOSIS — N186 End stage renal disease: Secondary | ICD-10-CM | POA: Diagnosis not present

## 2023-08-16 DIAGNOSIS — N186 End stage renal disease: Secondary | ICD-10-CM | POA: Diagnosis not present

## 2023-08-16 DIAGNOSIS — Z992 Dependence on renal dialysis: Secondary | ICD-10-CM | POA: Diagnosis not present

## 2023-08-17 DIAGNOSIS — Z992 Dependence on renal dialysis: Secondary | ICD-10-CM | POA: Diagnosis not present

## 2023-08-17 DIAGNOSIS — N186 End stage renal disease: Secondary | ICD-10-CM | POA: Diagnosis not present

## 2023-08-18 DIAGNOSIS — G9341 Metabolic encephalopathy: Secondary | ICD-10-CM | POA: Diagnosis not present

## 2023-08-18 DIAGNOSIS — H548 Legal blindness, as defined in USA: Secondary | ICD-10-CM | POA: Diagnosis not present

## 2023-08-18 DIAGNOSIS — D509 Iron deficiency anemia, unspecified: Secondary | ICD-10-CM | POA: Diagnosis not present

## 2023-08-18 DIAGNOSIS — E1022 Type 1 diabetes mellitus with diabetic chronic kidney disease: Secondary | ICD-10-CM | POA: Diagnosis not present

## 2023-08-18 DIAGNOSIS — D631 Anemia in chronic kidney disease: Secondary | ICD-10-CM | POA: Diagnosis not present

## 2023-08-18 DIAGNOSIS — Z992 Dependence on renal dialysis: Secondary | ICD-10-CM | POA: Diagnosis not present

## 2023-08-18 DIAGNOSIS — N186 End stage renal disease: Secondary | ICD-10-CM | POA: Diagnosis not present

## 2023-08-19 DIAGNOSIS — Z992 Dependence on renal dialysis: Secondary | ICD-10-CM | POA: Diagnosis not present

## 2023-08-19 DIAGNOSIS — N186 End stage renal disease: Secondary | ICD-10-CM | POA: Diagnosis not present

## 2023-08-21 DIAGNOSIS — N186 End stage renal disease: Secondary | ICD-10-CM | POA: Diagnosis not present

## 2023-08-21 DIAGNOSIS — Z992 Dependence on renal dialysis: Secondary | ICD-10-CM | POA: Diagnosis not present

## 2023-08-22 DIAGNOSIS — Z992 Dependence on renal dialysis: Secondary | ICD-10-CM | POA: Diagnosis not present

## 2023-08-22 DIAGNOSIS — N186 End stage renal disease: Secondary | ICD-10-CM | POA: Diagnosis not present

## 2023-08-23 DIAGNOSIS — N186 End stage renal disease: Secondary | ICD-10-CM | POA: Diagnosis not present

## 2023-08-23 DIAGNOSIS — Z992 Dependence on renal dialysis: Secondary | ICD-10-CM | POA: Diagnosis not present

## 2023-08-24 DIAGNOSIS — N186 End stage renal disease: Secondary | ICD-10-CM | POA: Diagnosis not present

## 2023-08-24 DIAGNOSIS — Z992 Dependence on renal dialysis: Secondary | ICD-10-CM | POA: Diagnosis not present

## 2023-08-25 DIAGNOSIS — N186 End stage renal disease: Secondary | ICD-10-CM | POA: Diagnosis not present

## 2023-08-25 DIAGNOSIS — Z992 Dependence on renal dialysis: Secondary | ICD-10-CM | POA: Diagnosis not present

## 2023-08-26 ENCOUNTER — Ambulatory Visit

## 2023-08-26 DIAGNOSIS — Z992 Dependence on renal dialysis: Secondary | ICD-10-CM | POA: Diagnosis not present

## 2023-08-26 DIAGNOSIS — N186 End stage renal disease: Secondary | ICD-10-CM | POA: Diagnosis not present

## 2023-08-27 ENCOUNTER — Emergency Department (HOSPITAL_BASED_OUTPATIENT_CLINIC_OR_DEPARTMENT_OTHER)

## 2023-08-27 ENCOUNTER — Encounter (HOSPITAL_BASED_OUTPATIENT_CLINIC_OR_DEPARTMENT_OTHER): Payer: Self-pay | Admitting: Emergency Medicine

## 2023-08-27 ENCOUNTER — Observation Stay (HOSPITAL_BASED_OUTPATIENT_CLINIC_OR_DEPARTMENT_OTHER)
Admission: EM | Admit: 2023-08-27 | Discharge: 2023-08-30 | Disposition: A | Discharge status: Home / Self Care | Attending: Internal Medicine | Admitting: Internal Medicine

## 2023-08-27 ENCOUNTER — Other Ambulatory Visit: Payer: Self-pay

## 2023-08-27 DIAGNOSIS — Z794 Long term (current) use of insulin: Secondary | ICD-10-CM | POA: Insufficient documentation

## 2023-08-27 DIAGNOSIS — R0602 Shortness of breath: Secondary | ICD-10-CM | POA: Insufficient documentation

## 2023-08-27 DIAGNOSIS — R0781 Pleurodynia: Secondary | ICD-10-CM | POA: Diagnosis not present

## 2023-08-27 DIAGNOSIS — E10622 Type 1 diabetes mellitus with other skin ulcer: Secondary | ICD-10-CM | POA: Insufficient documentation

## 2023-08-27 DIAGNOSIS — Z992 Dependence on renal dialysis: Secondary | ICD-10-CM | POA: Insufficient documentation

## 2023-08-27 DIAGNOSIS — E1122 Type 2 diabetes mellitus with diabetic chronic kidney disease: Secondary | ICD-10-CM | POA: Insufficient documentation

## 2023-08-27 DIAGNOSIS — J45909 Unspecified asthma, uncomplicated: Secondary | ICD-10-CM | POA: Diagnosis not present

## 2023-08-27 DIAGNOSIS — Z955 Presence of coronary angioplasty implant and graft: Secondary | ICD-10-CM | POA: Insufficient documentation

## 2023-08-27 DIAGNOSIS — J989 Respiratory disorder, unspecified: Secondary | ICD-10-CM | POA: Diagnosis not present

## 2023-08-27 DIAGNOSIS — R079 Chest pain, unspecified: Secondary | ICD-10-CM | POA: Diagnosis present

## 2023-08-27 DIAGNOSIS — Z89512 Acquired absence of left leg below knee: Secondary | ICD-10-CM | POA: Insufficient documentation

## 2023-08-27 DIAGNOSIS — Z7982 Long term (current) use of aspirin: Secondary | ICD-10-CM | POA: Insufficient documentation

## 2023-08-27 DIAGNOSIS — I120 Hypertensive chronic kidney disease with stage 5 chronic kidney disease or end stage renal disease: Secondary | ICD-10-CM | POA: Insufficient documentation

## 2023-08-27 DIAGNOSIS — R778 Other specified abnormalities of plasma proteins: Secondary | ICD-10-CM | POA: Diagnosis not present

## 2023-08-27 DIAGNOSIS — Z951 Presence of aortocoronary bypass graft: Secondary | ICD-10-CM | POA: Insufficient documentation

## 2023-08-27 DIAGNOSIS — R748 Abnormal levels of other serum enzymes: Secondary | ICD-10-CM | POA: Insufficient documentation

## 2023-08-27 DIAGNOSIS — R059 Cough, unspecified: Secondary | ICD-10-CM | POA: Insufficient documentation

## 2023-08-27 DIAGNOSIS — I2510 Atherosclerotic heart disease of native coronary artery without angina pectoris: Secondary | ICD-10-CM | POA: Insufficient documentation

## 2023-08-27 DIAGNOSIS — Z743 Need for continuous supervision: Secondary | ICD-10-CM | POA: Diagnosis not present

## 2023-08-27 DIAGNOSIS — R0789 Other chest pain: Secondary | ICD-10-CM | POA: Diagnosis not present

## 2023-08-27 DIAGNOSIS — Z87891 Personal history of nicotine dependence: Secondary | ICD-10-CM | POA: Diagnosis not present

## 2023-08-27 DIAGNOSIS — Z981 Arthrodesis status: Secondary | ICD-10-CM | POA: Diagnosis not present

## 2023-08-27 DIAGNOSIS — R7989 Other specified abnormal findings of blood chemistry: Secondary | ICD-10-CM | POA: Diagnosis not present

## 2023-08-27 DIAGNOSIS — L97909 Non-pressure chronic ulcer of unspecified part of unspecified lower leg with unspecified severity: Secondary | ICD-10-CM | POA: Insufficient documentation

## 2023-08-27 DIAGNOSIS — E1022 Type 1 diabetes mellitus with diabetic chronic kidney disease: Secondary | ICD-10-CM | POA: Insufficient documentation

## 2023-08-27 DIAGNOSIS — R051 Acute cough: Principal | ICD-10-CM

## 2023-08-27 DIAGNOSIS — N186 End stage renal disease: Principal | ICD-10-CM | POA: Insufficient documentation

## 2023-08-27 DIAGNOSIS — E871 Hypo-osmolality and hyponatremia: Secondary | ICD-10-CM | POA: Diagnosis not present

## 2023-08-27 DIAGNOSIS — Z79899 Other long term (current) drug therapy: Secondary | ICD-10-CM | POA: Diagnosis not present

## 2023-08-27 DIAGNOSIS — I10 Essential (primary) hypertension: Secondary | ICD-10-CM | POA: Diagnosis not present

## 2023-08-27 HISTORY — DX: End stage renal disease: N18.6

## 2023-08-27 HISTORY — DX: End stage renal disease: Z99.2

## 2023-08-27 LAB — CBC WITH DIFFERENTIAL/PLATELET
Abs Immature Granulocytes: 0.05 10*3/uL (ref 0.00–0.07)
Basophils Absolute: 0.1 10*3/uL (ref 0.0–0.1)
Basophils Relative: 1 %
Eosinophils Absolute: 0.8 10*3/uL — ABNORMAL HIGH (ref 0.0–0.5)
Eosinophils Relative: 8 %
HCT: 33.2 % — ABNORMAL LOW (ref 39.0–52.0)
Hemoglobin: 10.9 g/dL — ABNORMAL LOW (ref 13.0–17.0)
Immature Granulocytes: 1 %
Lymphocytes Relative: 12 %
Lymphs Abs: 1.2 10*3/uL (ref 0.7–4.0)
MCH: 33 pg (ref 26.0–34.0)
MCHC: 32.8 g/dL (ref 30.0–36.0)
MCV: 100.6 fL — ABNORMAL HIGH (ref 80.0–100.0)
Monocytes Absolute: 0.9 10*3/uL (ref 0.1–1.0)
Monocytes Relative: 9 %
Neutro Abs: 7 10*3/uL (ref 1.7–7.7)
Neutrophils Relative %: 69 %
Platelets: 176 10*3/uL (ref 150–400)
RBC: 3.3 MIL/uL — ABNORMAL LOW (ref 4.22–5.81)
RDW: 15.2 % (ref 11.5–15.5)
WBC: 10.1 10*3/uL (ref 4.0–10.5)
nRBC: 0 % (ref 0.0–0.2)

## 2023-08-27 LAB — COMPREHENSIVE METABOLIC PANEL WITH GFR
ALT: 22 U/L (ref 0–44)
AST: 23 U/L (ref 15–41)
Albumin: 3.2 g/dL — ABNORMAL LOW (ref 3.5–5.0)
Alkaline Phosphatase: 145 U/L — ABNORMAL HIGH (ref 38–126)
Anion gap: 14 (ref 5–15)
BUN: 42 mg/dL — ABNORMAL HIGH (ref 6–20)
CO2: 22 mmol/L (ref 22–32)
Calcium: 8.5 mg/dL — ABNORMAL LOW (ref 8.9–10.3)
Chloride: 97 mmol/L — ABNORMAL LOW (ref 98–111)
Creatinine, Ser: 8.24 mg/dL — ABNORMAL HIGH (ref 0.61–1.24)
GFR, Estimated: 7 mL/min — ABNORMAL LOW (ref >60)
Glucose, Bld: 115 mg/dL — ABNORMAL HIGH (ref 70–99)
Potassium: 4.6 mmol/L (ref 3.5–5.1)
Sodium: 133 mmol/L — ABNORMAL LOW (ref 135–145)
Total Bilirubin: 0.3 mg/dL (ref 0.0–1.2)
Total Protein: 6.2 g/dL — ABNORMAL LOW (ref 6.5–8.1)

## 2023-08-27 LAB — BASIC METABOLIC PANEL WITH GFR
Anion gap: 13 (ref 5–15)
BUN: 45 mg/dL — ABNORMAL HIGH (ref 6–20)
CO2: 19 mmol/L — ABNORMAL LOW (ref 22–32)
Calcium: 8.3 mg/dL — ABNORMAL LOW (ref 8.9–10.3)
Chloride: 99 mmol/L (ref 98–111)
Creatinine, Ser: 9.08 mg/dL — ABNORMAL HIGH (ref 0.61–1.24)
GFR, Estimated: 6 mL/min — ABNORMAL LOW (ref >60)
Glucose, Bld: 159 mg/dL — ABNORMAL HIGH (ref 70–99)
Potassium: 5.1 mmol/L (ref 3.5–5.1)
Sodium: 131 mmol/L — ABNORMAL LOW (ref 135–145)

## 2023-08-27 LAB — LIPASE, BLOOD: Lipase: 54 U/L — ABNORMAL HIGH (ref 11–51)

## 2023-08-27 LAB — TROPONIN I (HIGH SENSITIVITY): Troponin I (High Sensitivity): 11 ng/L (ref <18)

## 2023-08-27 LAB — GLUCOSE, CAPILLARY: Glucose-Capillary: 149 mg/dL — ABNORMAL HIGH (ref 70–99)

## 2023-08-27 LAB — CBG MONITORING, ED
Glucose-Capillary: 92 mg/dL (ref 70–99)
Glucose-Capillary: 166 mg/dL — ABNORMAL HIGH (ref 70–99)

## 2023-08-27 LAB — TROPONIN T, HIGH SENSITIVITY
Troponin T High Sensitivity: 157 ng/L (ref <19)
Troponin T High Sensitivity: 152 ng/L (ref <19)

## 2023-08-27 MED ORDER — EZETIMIBE 10 MG PO TABS
10.0000 mg | ORAL_TABLET | Freq: Every day | ORAL | Status: DC
  Administered 2023-08-27 – 2023-08-29 (×3): 10 mg via ORAL
  Filled 2023-08-27 (×4): qty 1

## 2023-08-27 MED ORDER — GABAPENTIN 300 MG PO CAPS
300.0000 mg | ORAL_CAPSULE | Freq: Two times a day (BID) | ORAL | Status: DC
Start: 2023-08-27 — End: 2023-08-30
  Administered 2023-08-27 – 2023-08-30 (×7): 300 mg via ORAL
  Filled 2023-08-27 (×7): qty 1

## 2023-08-27 MED ORDER — BRIMONIDINE TARTRATE-TIMOLOL 0.2-0.5 % OP SOLN
1.0000 [drp] | Freq: Two times a day (BID) | OPHTHALMIC | Status: DC
  Filled 2023-08-27 (×2): qty 5

## 2023-08-27 MED ORDER — TORSEMIDE 20 MG PO TABS: 20.0000 mg | ORAL_TABLET | ORAL | Status: DC

## 2023-08-27 MED ORDER — PANTOPRAZOLE SODIUM 40 MG PO TBEC
40.0000 mg | DELAYED_RELEASE_TABLET | Freq: Every day | ORAL | Status: DC
  Administered 2023-08-27: 40 mg via ORAL
  Filled 2023-08-27: qty 1

## 2023-08-27 MED ORDER — ASPIRIN 81 MG PO CHEW
81.0000 mg | CHEWABLE_TABLET | Freq: Every day | ORAL | Status: DC
  Administered 2023-08-27 – 2023-08-30 (×4): 81 mg via ORAL
  Filled 2023-08-27 (×4): qty 1

## 2023-08-27 MED ORDER — AZELASTINE HCL 0.1 % NA SOLN
2.0000 | Freq: Two times a day (BID) | NASAL | Status: DC
  Administered 2023-08-29 – 2023-08-30 (×2): 2 via NASAL
  Filled 2023-08-27 (×2): qty 30

## 2023-08-27 MED ORDER — ATORVASTATIN CALCIUM 80 MG PO TABS
80.0000 mg | ORAL_TABLET | Freq: Every day | ORAL | Status: DC
Start: 2023-08-27 — End: 2023-08-30
  Administered 2023-08-27 – 2023-08-29 (×3): 80 mg via ORAL
  Filled 2023-08-27 (×3): qty 1

## 2023-08-27 MED ORDER — DORZOLAMIDE HCL 2 % OP SOLN
1.0000 [drp] | Freq: Two times a day (BID) | OPHTHALMIC | Status: DC
  Administered 2023-08-28 – 2023-08-30 (×4): 1 [drp] via OPHTHALMIC
  Filled 2023-08-27 (×2): qty 10

## 2023-08-27 MED ORDER — TORSEMIDE 20 MG PO TABS
40.0000 mg | ORAL_TABLET | Freq: Two times a day (BID) | ORAL | Status: DC
  Administered 2023-08-28 – 2023-08-30 (×5): 40 mg via ORAL
  Filled 2023-08-27 (×6): qty 2

## 2023-08-27 MED ORDER — AMLODIPINE BESYLATE 5 MG PO TABS
5.0000 mg | ORAL_TABLET | Freq: Once | ORAL | Status: AC
Start: 2023-08-27 — End: 2023-08-27
  Administered 2023-08-27: 5 mg via ORAL
  Filled 2023-08-27: qty 1

## 2023-08-27 MED ORDER — INSULIN ASPART 100 UNIT/ML IJ SOLN
0.0000 [IU] | Freq: Three times a day (TID) | INTRAMUSCULAR | Status: DC
  Administered 2023-08-27 – 2023-08-28 (×2): 3 [IU] via SUBCUTANEOUS
  Administered 2023-08-28: 2 [IU] via SUBCUTANEOUS
  Administered 2023-08-28: 3 [IU] via SUBCUTANEOUS
  Administered 2023-08-29: 15 [IU] via SUBCUTANEOUS
  Administered 2023-08-29 (×2): 3 [IU] via SUBCUTANEOUS
  Administered 2023-08-30: 2 [IU] via SUBCUTANEOUS
  Administered 2023-08-30: 15 [IU] via SUBCUTANEOUS

## 2023-08-27 NOTE — ED Triage Notes (Signed)
 Pt states cough, congestion and chest tightness. X 1 week ago.

## 2023-08-27 NOTE — ED Notes (Signed)
 Introduced self to patient and family. Updated family on the plan and informed them we are still waiting for a room then transport. Patient is in no distress at this time, vitals stable, and patient was alert and oriented.

## 2023-08-27 NOTE — ED Provider Notes (Signed)
 [facility] Provider Note   CSN: [id number] Arrival date & time: 08/27/23  8295     History  Chief Complaint  Patient presents with   Cough    Chase Clark is a 58 y.o. male.  The history is provided by the patient.  Cough Chase Clark is a 58 y.o. male who presents to the Emergency Department complaining of cough.  He presents to the emergency department for evaluation of cough and chest tightness.  He has had about 1 month of cough that he contributes to a tickle in the back of his throat.  Over the last week his symptoms have significantly worsened and now he is having chest tightness and cough productive of clear sputum.  He has been experiencing occasional wheezing.  His upper back muscles hurt from coughing.  No fever or shortness of breath.  He did have abdominal pain yesterday but this has since resolved after he treated his constipation.  No sore throat.  He has occasional runny nose.  He has a history of ESRD and is on peritoneal dialysis nightly but did not dialyze last night secondary to needing to use the bathroom.  He also has a history of diabetes, coronary artery disease status post CABG, hypertension.  He was hospitalized in mid April secondary to adverse reaction to medications. He has chronic lower extremity edema and this is at his baseline.  Home Medications Prior to Admission medications   Medication Sig Start Date End Date Taking? Authorizing Provider  acetaminophen  (TYLENOL ) 325 MG tablet Take 1 tablet (325 mg total) by mouth every 6 (six) hours as needed for mild pain (pain score 1-3 or temp > 100.5). 09/06/17   Laliberte, Danielle, PA-C  amLODipine  (NORVASC ) 10 MG tablet Take 1 tablet (10 mg total) by mouth daily. Patient not taking: Reported on 08/06/2023 07/30/23   Pokhrel, Laxman, MD  aspirin  (ASPIRIN  CHILDRENS) 81 MG chewable tablet Chew 1 tablet (81 mg total) by mouth daily. 03/04/19   Hilarie Lovely, MD   atorvastatin  (LIPITOR ) 80 MG tablet Take 1 tablet (80 mg total) by mouth at bedtime. 08/06/23   Kaur, Charanpreet, NP  azelastine  (ASTELIN ) 0.1 % nasal spray Place 2 sprays into both nostrils 2 (two) times daily. Use in each nostril as directed 09/16/22   Kent Pear, MD  brimonidine -timolol  (COMBIGAN ) 0.2-0.5 % ophthalmic solution Place 1 drop into both eyes in the morning and at bedtime.    [provider]  Cholecalciferol (VITAMIN D3) 1000 units CAPS Take 1,000 Units by mouth in the morning.    [provider]  Continuous Glucose Sensor (DEXCOM G7 SENSOR) MISC USE 1 DEVICE AS DIRECTED Patient taking differently: Inject 1 Device into the skin See admin instructions. Place 1 new sensor into the skin every 10 days 09/03/22   Shamleffer, Julian Obey, MD  dorzolamide  (TRUSOPT ) 2 % ophthalmic solution Place 1 drop into both eyes 2 (two) times daily. 10/18/19   [provider]  esomeprazole  (NEXIUM ) 20 MG capsule Take 20 mg by mouth daily before breakfast.    [provider]  ezetimibe  (ZETIA ) 10 MG tablet Take 1 tablet (10 mg total) by mouth at bedtime. 08/03/23   Tona Francis, NP  FLOMAX  0.4 MG CAPS capsule Take 0.4 mg by mouth at bedtime. 04/03/21   [provider]  fluticasone  (FLONASE ) 50 MCG/ACT nasal spray SPRAY 2 SPRAYS INTO EACH NOSTRIL EVERY DAY Patient taking differently: Place 2 sprays into both  nostrils daily as needed for allergies or rhinitis. 11/14/19   Kent Pear, MD  gabapentin  (NEURONTIN ) 300 MG capsule Take 1 capsule (300 mg total) by mouth 2 (two) times daily. 07/30/23   Pokhrel, Laxman, MD  gentamicin  cream (GARAMYCIN ) 0.1 % Apply 1 application  topically See admin instructions. Apply around port site as directed 04/05/21   [provider]  insulin  degludec (TRESIBA  FLEXTOUCH) 200 UNIT/ML FlexTouch Pen Inject 42 Units into the skin in the morning and at bedtime. 09/24/22   Shamleffer, Ibtehal Jaralla, MD  insulin   lispro (HUMALOG  KWIKPEN) 100 UNIT/ML KwikPen Max daily 45 units Patient taking differently: Inject 15 Units into the skin See admin instructions. Inject 15 units into the skin three times a day with meals, per sliding scale- max of 45 units/day 09/18/22   Shamleffer, Ibtehal Jaralla, MD  Insulin  NPH, Human,, Isophane, (HUMULIN  N KWIKPEN) 100 UNIT/ML Kiwkpen Inject 70 Units into the skin daily in the afternoon. To be taken at the start of dialysis Patient taking differently: Inject 70 Units into the skin See admin instructions. Inject 70 units into the skin at bedtime- at the start of dialysis 09/24/22   Shamleffer, Ibtehal Jaralla, MD  Insulin  Pen Needle 32G X 4 MM MISC Inject 1 Device into the skin 5 (five) times daily. Use to inject Lantus  and Novolog  up to 5 times daily 11/15/20   Shamleffer, Julian Obey, MD  KLOR-CON  M20 20 MEQ tablet Take 20 mEq by mouth in the morning.    [provider]  lactulose  (CHRONULAC ) 10 GM/15ML solution TAKE 30 MLS (20 G TOTAL) BY MOUTH AS NEEDED. Patient taking differently: Take 20 g by mouth daily as needed for mild constipation. 11/02/20   Pyrtle, Amber Bail, MD  latanoprost  (XALATAN ) 0.005 % ophthalmic solution Place 1 drop into both eyes in the morning.    [provider]  metoCLOPramide  (REGLAN ) 10 MG tablet Take 1 tablet (10 mg total) by mouth at bedtime. 02/06/23   McMichael, Elba Greathouse, PA-C  midodrine (PROAMATINE) 5 MG tablet Take 5 mg by mouth 2 (two) times daily as needed (if the Systolic number is less than 100). 09/27/20   [provider]  mometasone  (ELOCON ) 0.1 % cream Apply 1 Application topically as directed. Qd to bid to aa right ankle, right lower leg until improved, then prn flares Patient taking differently: Apply 1 Application topically See admin instructions. Apply to affected area of the ankle and lower leg 1-2 times a day until improvement is seen, then as needed for flares 03/30/23   Stewart, Tara, MD  Multiple Vitamin  (MULTIVITAMIN ADULT PO) Take 1 tablet by mouth daily.    [provider]  ondansetron  (ZOFRAN -ODT) 4 MG disintegrating tablet Take 4 mg by mouth every 8 (eight) hours as needed for nausea or vomiting (dissolve orally).    [provider]  senna (SENOKOT) 8.6 MG tablet Take 2 tablets by mouth in the morning.    [provider]  Testosterone  20.25 MG/ACT (1.62%) GEL Place 2 Pump onto the skin daily in the afternoon. 05/15/23   Shamleffer, Ibtehal Jaralla, MD  torsemide  (DEMADEX ) 20 MG tablet Take 20-40 mg by mouth See admin instructions. Take 40 mg by mouth in the morning and 40 mg at lunchtime    [provider]  Vitamin D, Ergocalciferol, (DRISDOL) 1.25 MG (50000 UNIT) CAPS capsule Take 50,000 Units by mouth every 30 (thirty) days.    [provider]      Allergies  Baclofen    Review of Systems   Review of Systems  Respiratory:  Positive for cough.   All other systems reviewed and are negative.   Physical Exam Updated Vital Signs BP (!) 187/97   Pulse 77   Temp 97.9 F (36.6 C) (Oral)   Resp 14   Ht 5\' 10"  (1.778 m)   Wt 117.9 kg   SpO2 99%   BMI 37.31 kg/m  Physical Exam Vitals and nursing note reviewed.  Constitutional:      Appearance: He is well-developed.  HENT:     Head: Normocephalic and atraumatic.  Cardiovascular:     Rate and Rhythm: Normal rate and regular rhythm.     Heart sounds: No murmur heard. Pulmonary:     Effort: Pulmonary effort is normal. No respiratory distress.     Breath sounds: Normal breath sounds.  Abdominal:     Palpations: Abdomen is soft.     Tenderness: There is no abdominal tenderness. There is no guarding or rebound.     Comments: PD catheter in left hemi abdomen with dressing in place c/d/i  Musculoskeletal:        General: No tenderness.     Comments: Left BKA.  Pitting edema to RLE (pt reports chronic)  Skin:    General: Skin is warm and dry.  Neurological:     Mental Status: He is  alert and oriented to person, place, and time.  Psychiatric:        Behavior: Behavior normal.     ED Results / Procedures / Treatments   Labs (all labs ordered are listed, but only abnormal results are displayed) Labs Reviewed  COMPREHENSIVE METABOLIC PANEL WITH GFR - Abnormal; Notable for the following components:      Result Value   Sodium 133 (*)    Chloride 97 (*)    Glucose, Bld 115 (*)    BUN 42 (*)    Creatinine, Ser 8.24 (*)    Calcium  8.5 (*)    Total Protein 6.2 (*)    Albumin  3.2 (*)    Alkaline Phosphatase 145 (*)    GFR, Estimated 7 (*)    All other components within normal limits  CBC WITH DIFFERENTIAL/PLATELET - Abnormal; Notable for the following components:   RBC 3.30 (*)    Hemoglobin 10.9 (*)    HCT 33.2 (*)    MCV 100.6 (*)    Eosinophils Absolute 0.8 (*)    All other components within normal limits  LIPASE, BLOOD - Abnormal; Notable for the following components:   Lipase 54 (*)    All other components within normal limits  TROPONIN T, HIGH SENSITIVITY - Abnormal; Notable for the following components:   Troponin T High Sensitivity 157 (*)    All other components within normal limits  TROPONIN T, HIGH SENSITIVITY    EKG EKG Interpretation Date/Time:  Thursday Aug 27 2023 05:59:33 EDT Ventricular Rate:  74 PR Interval:  207 QRS Duration:  92 QT Interval:  405 QTC Calculation: 450 R Axis:   72  Text Interpretation: Sinus rhythm Borderline prolonged PR interval Borderline repolarization abnormality No significant change since last tracing Confirmed by Kelsey Patricia [phone]) on 08/27/2023 6:01:49 AM  Radiology DG Chest 2 View Result Date: 08/27/2023 CLINICAL DATA:  58 year old male with shortness of breath, cough, congestion, chest tightness. EXAM: CHEST - 2 VIEW COMPARISON:  Portable chest 07/26/2023 and earlier. FINDINGS: Portable AP semi upright and lateral views of the chest at 0639 hours. Chronic  CABG. Heart size and mediastinal contours  remain normal. Visualized tracheal air column is within normal limits. Chronic cervical ACDF. Lung volumes are stable since 2020. Both lungs appear clear. No pneumothorax or pleural effusion. Osteopenia. No acute osseous abnormality identified. Negative visible bowel gas. IMPRESSION: No acute cardiopulmonary abnormality. Electronically Signed   By: Marlise Simpers M.D.   On: 08/27/2023 06:52    Procedures Procedures    Medications Ordered in ED Medications  amLODipine  (NORVASC ) tablet 5 mg (5 mg Oral Given 08/27/23 0645)    ED Course/ Medical Decision Making/ A&P                                 Medical Decision Making Amount and/or Complexity of Data Reviewed Labs: ordered. Radiology: ordered.  Risk Prescription drug management.   Patient with ESRD on peritoneal dialysis, diabetes, coronary artery disease status post CABG here for evaluation of progressive cough, shortness of breath and chest discomfort.  EKG is abnormal but similar when compared to priors.  Labs with stable renal function, anemia.  He does have elevation in his troponin, has had mildly elevated troponins in the past.  He is hypertensive here, did not receive his home blood pressure medication-will give his morning medication in the emergency department.  Chest x-ray without acute abnormality.  Plan to check repeat troponin to trend.  Patient care transferred pending additional troponin.        Final Clinical Impression(s) / ED Diagnoses Final diagnoses:  None    Rx / DC Orders ED Discharge Orders     None         Kelsey Patricia, MD 08/27/23 [phone]

## 2023-08-27 NOTE — ED Notes (Signed)
 Patient transported to X-ray

## 2023-08-27 NOTE — ED Provider Notes (Signed)
 Care was taken over from Dr. Alayne Hubert.  Patient had presented with a cough.  Some suggestions of orthopnea.  He also has some atypical pain across his chest and back.  Describes it as a tightness but is also worse with coughing.  His chest x-ray does not show any overt pulmonary edema.  His leg swelling is reportedly at baseline.  His troponins are elevated.  His first 1 was 157 and second was 152.  On chart review, his troponins last month were normal.  No evidence of chronic elevation in his troponins.  Discussed with cardiology, Dr. Albert Huff, who recommends medicine admission and cardiology will consult when patient gets to [facility].  Discussed with Dr. Felipe Horton who is excepted the patient for admission.  Home medications ordered.   Hershel Los, MD 08/27/23 1029

## 2023-08-28 ENCOUNTER — Encounter (HOSPITAL_COMMUNITY): Payer: Self-pay | Admitting: Internal Medicine

## 2023-08-28 ENCOUNTER — Observation Stay (HOSPITAL_COMMUNITY)

## 2023-08-28 DIAGNOSIS — R079 Chest pain, unspecified: Secondary | ICD-10-CM | POA: Diagnosis not present

## 2023-08-28 DIAGNOSIS — E1039 Type 1 diabetes mellitus with other diabetic ophthalmic complication: Secondary | ICD-10-CM | POA: Diagnosis not present

## 2023-08-28 DIAGNOSIS — R051 Acute cough: Secondary | ICD-10-CM | POA: Diagnosis not present

## 2023-08-28 DIAGNOSIS — N186 End stage renal disease: Secondary | ICD-10-CM | POA: Diagnosis not present

## 2023-08-28 DIAGNOSIS — Z992 Dependence on renal dialysis: Secondary | ICD-10-CM

## 2023-08-28 DIAGNOSIS — I120 Hypertensive chronic kidney disease with stage 5 chronic kidney disease or end stage renal disease: Secondary | ICD-10-CM | POA: Diagnosis not present

## 2023-08-28 DIAGNOSIS — E8779 Other fluid overload: Secondary | ICD-10-CM | POA: Diagnosis not present

## 2023-08-28 DIAGNOSIS — R7989 Other specified abnormal findings of blood chemistry: Secondary | ICD-10-CM | POA: Diagnosis not present

## 2023-08-28 DIAGNOSIS — D631 Anemia in chronic kidney disease: Secondary | ICD-10-CM | POA: Diagnosis not present

## 2023-08-28 DIAGNOSIS — N2581 Secondary hyperparathyroidism of renal origin: Secondary | ICD-10-CM | POA: Diagnosis not present

## 2023-08-28 DIAGNOSIS — R053 Chronic cough: Secondary | ICD-10-CM | POA: Diagnosis not present

## 2023-08-28 LAB — GLUCOSE, CAPILLARY
Glucose-Capillary: 176 mg/dL — ABNORMAL HIGH (ref 70–99)
Glucose-Capillary: 170 mg/dL — ABNORMAL HIGH (ref 70–99)
Glucose-Capillary: 165 mg/dL — ABNORMAL HIGH (ref 70–99)
Glucose-Capillary: 130 mg/dL — ABNORMAL HIGH (ref 70–99)
Glucose-Capillary: 165 mg/dL — ABNORMAL HIGH (ref 70–99)
Glucose-Capillary: 276 mg/dL — ABNORMAL HIGH (ref 70–99)

## 2023-08-28 LAB — PHOSPHORUS: Phosphorus: 7.2 mg/dL — ABNORMAL HIGH (ref 2.5–4.6)

## 2023-08-28 LAB — BRAIN NATRIURETIC PEPTIDE: B Natriuretic Peptide: 54.4 pg/mL (ref 0.0–100.0)

## 2023-08-28 LAB — TROPONIN I (HIGH SENSITIVITY): Troponin I (High Sensitivity): 12 ng/L (ref <18)

## 2023-08-28 LAB — GAMMA GT: GGT: 31 U/L (ref 7–50)

## 2023-08-28 MED ORDER — PANTOPRAZOLE SODIUM 40 MG PO TBEC
40.0000 mg | DELAYED_RELEASE_TABLET | Freq: Two times a day (BID) | ORAL | Status: DC
  Administered 2023-08-28 – 2023-08-30 (×5): 40 mg via ORAL
  Filled 2023-08-28 (×5): qty 1

## 2023-08-28 MED ORDER — ACETAMINOPHEN 500 MG PO TABS
500.0000 mg | ORAL_TABLET | Freq: Four times a day (QID) | ORAL | Status: DC | PRN
  Administered 2023-08-28: 500 mg via ORAL
  Filled 2023-08-28: qty 1

## 2023-08-28 MED ORDER — GENTAMICIN SULFATE 0.1 % EX CREA
1.0000 | TOPICAL_CREAM | Freq: Every day | CUTANEOUS | Status: DC
  Administered 2023-08-28 – 2023-08-30 (×3): 1 via TOPICAL
  Filled 2023-08-28: qty 15

## 2023-08-28 MED ORDER — TIMOLOL MALEATE 0.5 % OP SOLN
1.0000 [drp] | Freq: Two times a day (BID) | OPHTHALMIC | Status: DC
  Administered 2023-08-28 – 2023-08-30 (×5): 1 [drp] via OPHTHALMIC
  Filled 2023-08-28: qty 5

## 2023-08-28 MED ORDER — BRIMONIDINE TARTRATE 0.2 % OP SOLN
1.0000 [drp] | Freq: Two times a day (BID) | OPHTHALMIC | Status: DC
  Administered 2023-08-28 – 2023-08-30 (×5): 1 [drp] via OPHTHALMIC
  Filled 2023-08-28: qty 5

## 2023-08-28 MED ORDER — INSULIN DEGLUDEC 100 UNIT/ML ~~LOC~~ SOPN: 42.0000 [IU] | PEN_INJECTOR | SUBCUTANEOUS | Status: DC

## 2023-08-28 MED ORDER — FLUTICASONE PROPIONATE 50 MCG/ACT NA SUSP
2.0000 | Freq: Every day | NASAL | Status: DC
  Administered 2023-08-28 – 2023-08-29 (×2): 2 via NASAL
  Filled 2023-08-28: qty 16

## 2023-08-28 MED ORDER — INSULIN DEGLUDEC 200 UNIT/ML ~~LOC~~ SOPN: 42.0000 [IU] | PEN_INJECTOR | Freq: Every day | SUBCUTANEOUS | Status: DC

## 2023-08-28 MED ORDER — HEPARIN SODIUM (PORCINE) 1000 UNIT/ML IJ SOLN
INTRAPERITONEAL | Status: DC | PRN
  Filled 2023-08-28 (×3): qty 6000

## 2023-08-28 MED ORDER — INSULIN ASPART 100 UNIT/ML IJ SOLN
0.0000 [IU] | Freq: Every day | INTRAMUSCULAR | Status: DC
  Administered 2023-08-28: 3 [IU] via SUBCUTANEOUS
  Administered 2023-08-29: 4 [IU] via SUBCUTANEOUS

## 2023-08-28 MED ORDER — GUAIFENESIN ER 600 MG PO TB12: 600.0000 mg | ORAL_TABLET | Freq: Two times a day (BID) | ORAL | Status: DC | PRN

## 2023-08-28 MED ORDER — LORATADINE 10 MG PO TABS
10.0000 mg | ORAL_TABLET | Freq: Every day | ORAL | Status: DC
  Administered 2023-08-28 – 2023-08-30 (×3): 10 mg via ORAL
  Filled 2023-08-28 (×3): qty 1

## 2023-08-28 MED ORDER — SENNA 8.6 MG PO TABS
17.2000 mg | ORAL_TABLET | Freq: Every day | ORAL | Status: DC
  Administered 2023-08-28 – 2023-08-30 (×3): 17.2 mg via ORAL
  Filled 2023-08-28 (×3): qty 2

## 2023-08-28 MED ORDER — AMLODIPINE BESYLATE 10 MG PO TABS
10.0000 mg | ORAL_TABLET | Freq: Every day | ORAL | Status: DC
  Administered 2023-08-28 – 2023-08-30 (×3): 10 mg via ORAL
  Filled 2023-08-28 (×3): qty 1

## 2023-08-28 MED ORDER — INSULIN ISOPHANE HUMAN 100 UNIT/ML KWIKPEN: 70.0000 [IU] | PEN_INJECTOR | SUBCUTANEOUS | Status: DC

## 2023-08-28 MED ORDER — DELFLEX-LC/4.25% DEXTROSE 483 MOSM/L IP SOLN: INTRAPERITONEAL | Status: DC

## 2023-08-28 MED ORDER — HYDRALAZINE HCL 20 MG/ML IJ SOLN: 10.0000 mg | Freq: Three times a day (TID) | INTRAMUSCULAR | Status: DC | PRN

## 2023-08-28 MED ORDER — LATANOPROST 0.005 % OP SOLN
1.0000 [drp] | Freq: Every day | OPHTHALMIC | Status: DC
  Administered 2023-08-28 – 2023-08-29 (×2): 1 [drp] via OPHTHALMIC
  Filled 2023-08-28: qty 2.5

## 2023-08-28 MED ORDER — HEPARIN SODIUM (PORCINE) 5000 UNIT/ML IJ SOLN
5000.0000 [IU] | Freq: Three times a day (TID) | INTRAMUSCULAR | Status: DC
  Administered 2023-08-28 – 2023-08-30 (×6): 5000 [IU] via SUBCUTANEOUS
  Filled 2023-08-28 (×6): qty 1

## 2023-08-28 MED ORDER — MIDODRINE HCL 5 MG PO TABS
5.0000 mg | ORAL_TABLET | Freq: Two times a day (BID) | ORAL | Status: DC | PRN
  Administered 2023-08-29: 5 mg via ORAL
  Filled 2023-08-28: qty 1

## 2023-08-28 MED ORDER — MAGNESIUM SULFATE 2 GM/50ML IV SOLN
2.0000 g | Freq: Once | INTRAVENOUS | Status: AC
  Administered 2023-08-28: 2 g via INTRAVENOUS
  Filled 2023-08-28: qty 50

## 2023-08-28 MED ORDER — INSULIN NPH (HUMAN) (ISOPHANE) 100 UNIT/ML ~~LOC~~ SUSP
30.0000 [IU] | Freq: Every day | SUBCUTANEOUS | Status: DC
  Administered 2023-08-28: 30 [IU] via SUBCUTANEOUS
  Filled 2023-08-28: qty 10

## 2023-08-28 MED ORDER — ALBUTEROL SULFATE (2.5 MG/3ML) 0.083% IN NEBU: 2.5000 mg | INHALATION_SOLUTION | Freq: Four times a day (QID) | RESPIRATORY_TRACT | Status: DC | PRN

## 2023-08-28 NOTE — Progress Notes (Addendum)
 PROGRESS NOTE    Chase Clark  ZOX:[id number] DOB: [birth year] DOA: 08/27/2023 PCP: Jacklin Mascot, MD   Brief Narrative: 58 year old with past medical history significant for diabetes type 2 but recently converted into type I.  The endocrinologist notes, ESRD on peritoneal dialysis, hypertension, diabetic leg ulcer who presents with severe cough worse over the last months.  Also reports chest pain from coughing.     Assessment & Plan:   Principal Problem:   Chest pain   1-Cough, pleuritic chest pain: - Presents with worsening cough of the last month. - X-ray:No acute cardiopulmonary abnormality.  -EKG: sinus Rhythm CT chest order. Check BNP. Will tx post nasal drip, allergies, reflux. PRN albuterol  ordered.   Mild elevation troponin:  Ho CAD s/p CABG -flat in setting HD -demand ischemia mismatch.  -Cardiology was consulted by Admitter. -ECHO ordered.  ESRD on peritoneal dialysis -Nephrology consulted.   Diabetes mellitus insulin -dependent -resume NPH 30 units.  -SSI  Hypomagnesemia:  Replaced IV   Hyponatremia; in setting CKD HTN; Resume Norvasc .   Elevation ALk Phosphatase.  Check GTT  Estimated body mass index is 38.34 kg/m as calculated from the following:   Height as of this encounter: 5\' 10"  (1.778 m).   Weight as of this encounter: 121.2 kg.   DVT prophylaxis: heparin   Code Status: Full code Family Communication: Disposition Plan:  Status is: Observation The patient remains OBS appropriate and will d/c before 2 midnights.    Consultants:  Nephrology Cardiology   Procedures:  ECHO  Antimicrobials:    Subjective: He report worsening cough over last 2 weeks, chest congestion, small amount of phlegm.    Objective: Vitals:   08/27/23 1900 08/27/23 2023 08/28/23 0128 08/28/23 0448  BP:  (!) 159/93 (!) 140/92 (!) 148/93  Pulse:  84 87 84  Resp:  18 18 18   Temp: 98.1 F (36.7 C) 98 F (36.7 C) 98.7 F (37.1 C) 98.3 F (36.8 C)   TempSrc:  Oral Oral Oral  SpO2:  98% 97% 97%  Weight:    121.2 kg  Height:        Intake/Output Summary (Last 24 hours) at 08/28/2023 0716 Last data filed at 08/28/2023 0129 Gross per 24 hour  Intake --  Output 800 ml  Net -800 ml   Filed Weights   08/27/23 0512 08/28/23 0448  Weight: 117.9 kg 121.2 kg    Examination:  General exam: Appears calm and comfortable  Respiratory system: Clear to auscultation. Respiratory effort normal. Cardiovascular system: S1 & S2 heard, RRR. No JVD, murmurs, rubs, gallops or clicks. No pedal edema. Gastrointestinal system: Abdomen is nondistended, soft and nontender. No organomegaly or masses felt. Normal bowel sounds heard. Central nervous system: Alert and oriented. No focal neurological deficits. Extremities: Symmetric 5 x 5 power.   Data Reviewed: I have personally reviewed following labs and imaging studies  CBC: Recent Labs  Lab 08/27/23 0552  WBC 10.1  NEUTROABS 7.0  HGB 10.9*  HCT 33.2*  MCV 100.6*  PLT 176   Basic Metabolic Panel: Recent Labs  Lab 08/27/23 0552 08/27/23 2211  NA 133* 131*  K 4.6 5.1  CL 97* 99  CO2 22 19*  GLUCOSE 115* 159*  BUN 42* 45*  CREATININE 8.24* 9.08*  CALCIUM  8.5* 8.3*   GFR: Estimated Creatinine Clearance: 11.3 mL/min (A) (by C-G formula based on SCr of 9.08 mg/dL (H)). Liver Function Tests: Recent Labs  Lab 08/27/23 0552  AST 23  ALT 22  ALKPHOS 145*  BILITOT 0.3  PROT 6.2*  ALBUMIN  3.2*   Recent Labs  Lab 08/27/23 0552  LIPASE 54*   No results for input(s): "AMMONIA" in the last 168 hours. Coagulation Profile: No results for input(s): "INR", "PROTIME" in the last 168 hours. Cardiac Enzymes: No results for input(s): "CKTOTAL", "CKMB", "CKMBINDEX", "TROPONINI" in the last 168 hours. BNP (last 3 results) No results for input(s): "PROBNP" in the last 8760 hours. HbA1C: No results for input(s): "HGBA1C" in the last 72 hours. CBG: Recent Labs  Lab 08/27/23 1056  08/27/23 1838 08/27/23 2129 08/28/23 0344 08/28/23 0618  GLUCAP 92 166* 149* 176* 170*   Lipid Profile: No results for input(s): "CHOL", "HDL", "LDLCALC", "TRIG", "CHOLHDL", "LDLDIRECT" in the last 72 hours. Thyroid  Function Tests: No results for input(s): "TSH", "T4TOTAL", "FREET4", "T3FREE", "THYROIDAB" in the last 72 hours. Anemia Panel: No results for input(s): "VITAMINB12", "FOLATE", "FERRITIN", "TIBC", "IRON", "RETICCTPCT" in the last 72 hours. Sepsis Labs: No results for input(s): "PROCALCITON", "LATICACIDVEN" in the last 168 hours.  No results found for this or any previous visit (from the past 240 hours).       Radiology Studies: DG Chest 2 View Result Date: 08/27/2023 CLINICAL DATA:  58 year old male with shortness of breath, cough, congestion, chest tightness. EXAM: CHEST - 2 VIEW COMPARISON:  Portable chest 07/26/2023 and earlier. FINDINGS: Portable AP semi upright and lateral views of the chest at 0639 hours. Chronic CABG. Heart size and mediastinal contours remain normal. Visualized tracheal air column is within normal limits. Chronic cervical ACDF. Lung volumes are stable since 2020. Both lungs appear clear. No pneumothorax or pleural effusion. Osteopenia. No acute osseous abnormality identified. Negative visible bowel gas. IMPRESSION: No acute cardiopulmonary abnormality. Electronically Signed   By: Marlise Simpers M.D.   On: 08/27/2023 06:52        Scheduled Meds:  aspirin   81 mg Oral Daily   atorvastatin   80 mg Oral QHS   azelastine   2 spray Each Nare BID   brimonidine -timolol   1 drop Both Eyes Q12H   dorzolamide   1 drop Both Eyes BID   ezetimibe   10 mg Oral QHS   gabapentin   300 mg Oral BID   insulin  aspart  0-15 Units Subcutaneous TID WC   latanoprost   1 drop Both Eyes QHS   pantoprazole   40 mg Oral Daily   senna  17.2 mg Oral Daily   torsemide   40 mg Oral BID   Continuous Infusions:  magnesium  sulfate bolus IVPB       LOS: 0 days    Time spent: 35  minutes    Jennefer Kopp A Rocco Kerkhoff, MD [facility]   If 7PM-7AM, please contact night-coverage [email or web address]  08/28/2023, 7:16 AM

## 2023-08-28 NOTE — Consult Note (Addendum)
 Cardiology Consultation   Patient ID: [name] MRN: [id number]; DOB: 1966-03-22  Admit date: 08/27/2023 Date of Consult: 08/28/2023  PCP:  Jacklin Mascot, MD   [facility] Providers Cardiologist:  Sammy Crisp, MD     Patient Profile:   Chase Clark is a 58 y.o. male with a hx of CAD s/p CABG (LIMA-LAD, SVG-D1), HTN, HLD, DM, ESRD on PD, left BKA and Oletta Berry Syndrome who is being seen 08/28/2023 for the evaluation of congestion/cough at the request of Dr. Landrum Pink.  History of Present Illness:   [name] is a 58 yo male with PMH noted above. He was previously followed by Dr. Cara Chancellor [facility].  He underwent CABG 01/2019 with off-pump LIMA to LAD and SVG to D1 after unsuccessful PCI attempt.   He transitioned to [facility] heart care and was seen by Dr. Nolan Battle 09/2022.  Reported he was undergoing workup for kidney transplant evaluation at [facility] and was noted to have a poor exercise tolerance during CPX but this was attributed to hip pain.  It was recommended that he participate in cardiac rehab to try to improve his functionality.  He was continued on aspirin  and atorvastatin .  Of note had been on peritoneal dialysis for about a year and a half at that point.  Presented to med [facility] on 5/15 with complaints of cough and difficulty clearing his throat. Reports for the past several days he has had a dry cough and irritation in his throat. The evening prior to admission he had to sit up on side the bed bc he just didn't feel right, wife reports he was wheezing. This prompted him to present to the ED.   In the ED labs showed sodium 133, potassium 4.6, creatinine 8.24, high-sensitivity troponin 157>> 52, WBC 10, hemoglobin 10.9.  Chest x-ray negative.  EKG showed  In talking with patient, he has been doing ok from a cardiac standpoint since the time of his CABG. Denies any chest pain, or anginal symptoms prior to admission. Reports compliance with his PD and makes  decent urine on a daily basis. Follows with Dr. Zelda Hickman with nephrology. Has not really had orthopnea or PND but does note puffiness in his upper extremity on the left side.   Transferred to [facility] for further evaluation and cardiology asked to see.   Past Medical History:  Diagnosis Date   Anemia    Arthritis    Asthma    as child   Atypical mole 09/22/2022   R spinal mid back, needs excision   Barrett's esophagus 02/16/2014   short segment   Cataract    Chronic kidney disease (CKD), stage IV (severe) (HCC)    Coronary artery disease    DDD (degenerative disc disease), cervical    Diabetes mellitus without complication (HCC)    Diabetic osteomyelitis (HCC)    Diabetic peripheral neuropathy (HCC)    Diabetic ulcer of foot with muscle involvement without evidence of necrosis (HCC)    DIABETIC ULCERATIONS ASSOCIATED WITH IRRITATION LATERAL ANKLE LEFT GREATER THAN RIGHT WITH MILD CELLULITIS    Diverticulosis    DKA (diabetic ketoacidoses) 08/23/2017   Edema, lower extremity    Fatty liver    Gastroparesis 2015   GERD (gastroesophageal reflux disease)    Gilbert's disease    Glaucoma    Hx of BKA, left (HCC)    Hx of CABG 01/27/2019   2 vessels; LIMA-LAD, SVG-D1   Hx of heart artery stent 11/20/2009  80% D1 stenosis - 2.5 x 18 mm Xience V Everolimus (DES) stent x 1 placed   Hyperlipidemia    Hypertension    Left below-knee amputee (HCC)    Legally blind    Myocardial infarction (HCC) 11/2009   Neuropathy    Osteoarthritis of spine    Osteomyelitis of left foot (HCC)    Peritoneal dialysis catheter in place [facility])    Does dialysis over night.   PONV (postoperative nausea and vomiting)    PVD (peripheral vascular disease) (HCC)    Seasonal allergies    Shoulder pain    Tubular adenoma of colon    Ulcer of foot (HCC) 11/17/2012   DIABETIC ULCERATIONS ASSOCIATED WITH IRRITATION LATERAL ANKLE LEFT GREATER THAN RIGHT WITH MILD CELLULITIS    Past Surgical History:  Procedure  Laterality Date   ABDOMINAL AORTOGRAM N/A 05/20/2016   Procedure: Abdominal Aortogram possible intervention;  Surgeon: Jackquelyn Mass, MD;  Location: [facility] INVASIVE CV LAB;  Service: Cardiovascular;  Laterality: N/A;   AMPUTATION Left 09/05/2017   Procedure: AMPUTATION BELOW KNEE;  Surgeon: Sammye Cristal, MD;  Location: [facility] OR;  Service: Orthopedics;  Laterality: Left;   ANTERIOR CERVICAL DECOMP/DISCECTOMY FUSION N/A 05/07/2017   Procedure: ANTERIOR CERVICAL DECOMPRESSION/DISCECTOMY Remer Cargo PROSTHESIS,PLATE/SCREWS CERVICAL FIVE - CERVICAL SIX;  Surgeon: Garry Kansas, MD;  Location: [facility] OR;  Service: Neurosurgery;  Laterality: N/A;   CAPD INSERTION N/A 08/30/2020   Procedure: LAPAROSCOPIC INSERTION CONTINUOUS AMBULATORY PERITONEAL DIALYSIS  (CAPD) CATHETER;  Surgeon: Emmalene Hare, MD;  Location: [facility] ORS;  Service: General;  Laterality: N/A;   CATARACT EXTRACTION W/ INTRAOCULAR LENS IMPLANT     CATARACT EXTRACTION W/PHACO Left 02/26/2017   Procedure: CATARACT EXTRACTION PHACO AND INTRAOCULAR LENS PLACEMENT (IOC);  Surgeon: Rosa College, MD;  Location: [facility] ORS;  Service: Ophthalmology;  Laterality: Left;  Lot # [id number] H US : 00:25.3 AP%: 6.2 CDE: 1.59    CHOLECYSTECTOMY N/A    COLONOSCOPY WITH PROPOFOL  N/A 05/30/2021   Procedure: COLONOSCOPY WITH PROPOFOL ;  Surgeon: Nannette Babe, MD;  Location: [facility] ENDOSCOPY;  Service: Gastroenterology;  Laterality: N/A;   CORONARY ANGIOPLASTY WITH STENT PLACEMENT  11/20/2009   80% D1 stenosis - 2.5 x 18 mm Xience V Everolimus (DES) stent x 1 placed; LOCATION: [facility]; SURGEON: Starlette Ebbs, MD   CORONARY ARTERY BYPASS GRAFT N/A 01/27/2019   Procedure: OFF PUMP CORONARY ARTERY BYPASS GRAFTING (CABG) X 2 WITH ENDOSCOPIC HARVESTING OF RIGHT GREATER SAPHENOUS VEIN. LIMA TO LAD;  Surgeon: Hilarie Lovely, MD;  Location: [facility] OR;  Service: Open Heart Surgery;  Laterality: N/A;   CORONARY STENT INTERVENTION N/A 01/21/2019   Procedure: CORONARY  STENT INTERVENTION;  Surgeon: Arty Binning, MD;  Location: [facility] INVASIVE CV LAB;  Service: Cardiovascular;  Laterality: N/A;   EPIBLEPHERON REPAIR WITH TEAR DUCT PROBING     EYE SURGERY Right 02/09/2014   cataract extraction   INSERTION EXPRESS TUBE SHUNT Right 09/06/2015   Procedure: INSERTION AHMED TUBE SHUNT with tutoplast allograft;  Surgeon: Rosa College, MD;  Location: [facility] ORS;  Service: Ophthalmology;  Laterality: Right;   laparoscopic insertion continuous peritoneal dialysis  2022   LEFT HEART CATH AND CORONARY ANGIOGRAPHY N/A 01/20/2019   Procedure: LEFT HEART CATH AND CORONARY ANGIOGRAPHY;  Surgeon: Avanell Leigh, MD;  Location: [facility] INVAS[facility] CV LAB;  Service: Cardiovascular;  Laterality: N/A;   POLYPECTOMY  05/30/2021   Procedure: POLYPECTOMY;  Surgeon: Nannette Babe, MD;  Location: [facility];  Service: Gastroenterology;;   TEE WITHOUT CARDIOVERSION N/A  01/27/2019   Procedure: TRANSESOPHAGEAL ECHOCARDIOGRAM (TEE);  Surgeon: Hilarie Lovely, MD;  Location: [facility] OR;  Service: Open Heart Surgery;  Laterality: N/A;   VASECTOMY      Inpatient Medications: Scheduled Meds:  aspirin   81 mg Oral Daily   atorvastatin   80 mg Oral QHS   azelastine   2 spray Each Nare BID   brimonidine -timolol   1 drop Both Eyes Q12H   dorzolamide   1 drop Both Eyes BID   ezetimibe   10 mg Oral QHS   fluticasone   2 spray Each Nare Daily   gabapentin   300 mg Oral BID   insulin  aspart  0-15 Units Subcutaneous TID WC   latanoprost   1 drop Both Eyes QHS   loratadine  10 mg Oral Daily   pantoprazole   40 mg Oral BID   senna  17.2 mg Oral Daily   torsemide   40 mg Oral BID   Continuous Infusions:  magnesium  sulfate bolus IVPB     PRN Meds: acetaminophen , albuterol , guaiFENesin, midodrine  Allergies:    Allergies  Allergen Reactions   Baclofen Other (See Comments)    Disorientation, Contraindication with dialysis    Nsaids Other (See Comments)    CKD on dialysis    Statins Other (See  Comments)    Muscle pain    Social History:   Social History   Socioeconomic History   Marital status: Married    Spouse name: Not on file   Number of children: Not on file   Years of education: Not on file   Highest education level: Not on file  Occupational History   Not on file  Tobacco Use   Smoking status: Former    Current packs/day: 0.00    Average packs/day: 0.5 packs/day for 20.0 years (10.0 ttl pk-yrs)    Types: Cigarettes    Start date: 03/14/1988    Quit date: 03/14/2008    Years since quitting: 15.4   Smokeless tobacco: Never  Vaping Use   Vaping status: Never Used  Substance and Sexual Activity   Alcohol use: Not Currently    Comment: RARELY   Drug use: No   Sexual activity: Not on file  Other Topics Concern   Not on file  Social History Narrative   Used to work as Radiation protection practitioner    Married    Social Drivers of Corporate investment banker Strain: Low Risk  (12/26/2020)   Overall Financial Resource Strain (CARDIA)    Difficulty of Paying Living Expenses: Not hard at all  Food Insecurity: No Food Insecurity (08/28/2023)   Hunger Vital Sign    Worried About Running Out of Food in the Last Year: Never true    Ran Out of Food in the Last Year: Never true  Transportation Needs: No Transportation Needs (08/28/2023)   PRAPARE - Administrator, Civil Service (Medical): No    Lack of Transportation (Non-Medical): No  Physical Activity: Not on file  Stress: No Stress Concern Present (12/21/2019)   [employer] of Occupational Health - Occupational Stress Questionnaire    Feeling of Stress : Not at all  Social Connections: Unknown (02/26/2022)   [facility]   Social Network    Social Network: Not on file  Intimate Partner Violence: Not At Risk (08/28/2023)   Humiliation, Afraid, Rape, and Kick questionnaire    Fear of Current or Ex-Partner: No    Emotionally Abused: No    Physically Abused: No    Sexually  Abused: No     Family History:    Family History  Problem Relation Age of Onset   Hyperlipidemia Mother    Diabetes Mother    Liver disease Mother        NASH   Other Mother        immune thrombocytopenic pupura (ITP)   Heart attack Father    Hyperlipidemia Father    Diabetes Father    Hypertension Father    Lymphoma Father    Hemochromatosis Other        Grandmother (? which side)   Polycythemia Other    Colon cancer Neg Hx    Esophageal cancer Neg Hx    Rectal cancer Neg Hx    Stomach cancer Neg Hx      ROS:  Please see the history of present illness.   All other ROS reviewed and negative.     Physical Exam/Data:   Vitals:   08/27/23 2023 08/28/23 0128 08/28/23 0448 08/28/23 0752  BP: (!) 159/93 (!) 140/92 (!) 148/93 (!) 151/91  Pulse: 84 87 84 78  Resp: 18 18 18 19   Temp: 98 F (36.7 C) 98.7 F (37.1 C) 98.3 F (36.8 C) 98.3 F (36.8 C)  TempSrc: Oral Oral Oral Oral  SpO2: 98% 97% 97% 99%  Weight:   121.2 kg   Height:        Intake/Output Summary (Last 24 hours) at 08/28/2023 1048 Last data filed at 08/28/2023 0129 Gross per 24 hour  Intake --  Output 800 ml  Net -800 ml      08/28/2023    4:48 AM 08/27/2023    5:12 AM 08/06/2023    3:32 PM  Last 3 Weights  Weight (lbs) 267 lb 3.2 oz 260 lb --  Weight (kg) 121.2 kg 117.935 kg --     Body mass index is 38.34 kg/m.  General:  Well nourished, older blind male, in no acute distress, HEENT: normal Neck: + JVD Vascular: No carotid bruits; Distal pulses 2+ bilaterally Cardiac:  normal S1, S2; RRR; no murmur  Lungs: Diminished in bases Abd: soft, nontender, no hepatomegaly  Ext: LLE edema Musculoskeletal:  Left BKA Skin: warm and dry  Neuro:  no focal abnormalities noted Psych:  Normal affect   EKG:  The EKG was personally reviewed and demonstrates:  sinus rhythm, 74 bpm with T wave inversion in lead III, aVF, borderline prolonged QT interval (unchanged from prior tracing).  Telemetry:  Telemetry was personally  reviewed and demonstrates:  sinus rhythm  Relevant CV Studies:  Echo: 07/2020  IMPRESSIONS     1. Left ventricular ejection fraction, by estimation, is 65 to 70%. The  left ventricle has normal function. The left ventricle has no regional  wall motion abnormalities. There is moderate left ventricular hypertrophy.  Left ventricular diastolic  parameters were normal.   2. Right ventricular systolic function is normal. The right ventricular  size is normal.   3. The mitral valve is degenerative. Trivial mitral valve regurgitation.  No evidence of mitral stenosis.   4. The aortic valve is normal in structure. Aortic valve regurgitation is  not visualized. No aortic stenosis is present.   5. The inferior vena cava is normal in size with greater than 50%  respiratory variability, suggesting right atrial pressure of 3 mmHg.   FINDINGS   Left Ventricle: Left ventricular ejection fraction, by estimation, is 65  to 70%. The left ventricle has normal function. The left ventricle has no  regional wall motion abnormalities. The left ventricular internal cavity  size was normal in size. There is   moderate left ventricular hypertrophy. Left ventricular diastolic  parameters were normal.   Right Ventricle: The right ventricular size is normal. No increase in  right ventricular wall thickness. Right ventricular systolic function is  normal.   Left Atrium: Left atrial size was normal in size.   Right Atrium: Right atrial size was normal in size.   Pericardium: There is no evidence of pericardial effusion.   Mitral Valve: The mitral valve is degenerative in appearance. There is  mild thickening of the mitral valve leaflet(s). There is mild  calcification of the mitral valve leaflet(s). Trivial mitral valve  regurgitation. No evidence of mitral valve stenosis.  MV peak gradient, 5.6 mmHg. The mean mitral valve gradient is 2.0 mmHg.   Tricuspid Valve: The tricuspid valve is normal in  structure. Tricuspid  valve regurgitation is not demonstrated. No evidence of tricuspid  stenosis.   Aortic Valve: The aortic valve is normal in structure. Aortic valve  regurgitation is not visualized. No aortic stenosis is present. Aortic  valve mean gradient measures 5.0 mmHg. Aortic valve peak gradient measures  8.1 mmHg. Aortic valve area, by VTI  measures 2.53 cm.   Pulmonic Valve: The pulmonic valve was normal in structure. Pulmonic valve  regurgitation is trivial. No evidence of pulmonic stenosis.   Aorta: The aortic root is normal in size and structure.   Venous: The inferior vena cava is normal in size with greater than 50%  respiratory variability, suggesting right atrial pressure of 3 mmHg.   IAS/Shunts: No atrial level shunt detected by color flow Doppler.   Laboratory Data:  High Sensitivity Troponin:   Recent Labs  Lab 08/27/23 2211 08/28/23 0414  TROPONINIHS 11 12     Chemistry Recent Labs  Lab 08/27/23 0552 08/27/23 2211  NA 133* 131*  K 4.6 5.1  CL 97* 99  CO2 22 19*  GLUCOSE 115* 159*  BUN 42* 45*  CREATININE 8.24* 9.08*  CALCIUM  8.5* 8.3*  GFRNONAA 7* 6*  ANIONGAP 14 13    Recent Labs  Lab 08/27/23 0552  PROT 6.2*  ALBUMIN  3.2*  AST 23  ALT 22  ALKPHOS 145*  BILITOT 0.3   Lipids No results for input(s): "CHOL", "TRIG", "HDL", "LABVLDL", "LDLCALC", "CHOLHDL" in the last 168 hours.  Hematology Recent Labs  Lab 08/27/23 0552  WBC 10.1  RBC 3.30*  HGB 10.9*  HCT 33.2*  MCV 100.6*  MCH 33.0  MCHC 32.8  RDW 15.2  PLT 176   Thyroid  No results for input(s): "TSH", "FREET4" in the last 168 hours.  BNPNo results for input(s): "BNP", "PROBNP" in the last 168 hours.  DDimer No results for input(s): "DDIMER" in the last 168 hours.   Radiology/Studies:  DG Chest 2 View Result Date: 08/27/2023 CLINICAL DATA:  58 year old male with shortness of breath, cough, congestion, chest tightness. EXAM: CHEST - 2 VIEW COMPARISON:  Portable chest  07/26/2023 and earlier. FINDINGS: Portable AP semi upright and lateral views of the chest at 0639 hours. Chronic CABG. Heart size and mediastinal contours remain normal. Visualized tracheal air column is within normal limits. Chronic cervical ACDF. Lung volumes are stable since 2020. Both lungs appear clear. No pneumothorax or pleural effusion. Osteopenia. No acute osseous abnormality identified. Negative visible bowel gas. IMPRESSION: No acute cardiopulmonary abnormality. Electronically Signed   By: Marlise Simpers M.D.   On: 08/27/2023 06:52     Assessment  and Plan:   Chase Clark is a 58 y.o. male with a hx of CAD s/p CABG (LIMA-LAD, SVG-D1), HTN, HLD, DM, ESRD on PD, left BKA and Oletta Berry Syndrome who is being seen 08/28/2023 for the evaluation of congestion/cough at the request of Dr. Landrum Pink.  Cough/Congestion -- several days of mildly productive cough prior to admission. Trouble clearing his throat at home. Wife reports wheezing that prompted ED visit. -- CXR negative on admission, but seems to be at least mildly volume overloaded on exam -- CT chest pending  ESRD on PD Acute on Chronic HFpEF -- reports full compliance with PD prior to admission, but missed 2 sessions with being in the ED and transfer. Does still make urine and says output had been good at home. On torsemide  40mg  BID at home -- echo 2022 LVEF 65-70%, moderate LVH, normal RV -- management per nephrology -- check echo -- does have LE swelling, denies any hx of DVT. Defer to primary regarding need for US   CAD s/p CABG  (LIMA-LAD, SVG-D1) Elevated troponin -- hsTn with low flat trend in the setting of renal disease/volume overload. He denies any chest pain or anginal symptoms PTA -- suspect demand mismatch as noted above -- continue ASA, statin, Zetia   HTN -- blood pressures elevated, though he has missed two PD sessions -- continue amlodipine  10mg  daily  DM -- per primary  Risk Assessment/Risk Scores:    [place]   Heart Association (NYHA) Functional Class NYHA Class II   For questions or updates, please contact [facility] Please consult [email or web address] for contact info under   Signed, Johnie Nailer, NP  08/28/2023 10:48 AM  Patient seen and examined, note reviewed with the signed Advanced Practice Provider. I personally reviewed laboratory data, imaging studies and relevant notes. I independently examined the patient and formulated the important aspects of the plan. I have personally discussed the plan with the patient and/or family. Comments or changes to the note/plan are indicated below.  Patient seen and examined at his bedside.  Wife is by the bedside.  Elevated troponin which is flat in the setting of his renal disease and volume overload Coronary artery disease status post CABG no angina symptoms ESRD on PD Acute on chronic heart failure preserved ejection fraction-needs dialysis  Clinically appears to be volume overloaded.  He definitely needs his dialysis he has missed 2 sessions.  He is on torsemide  at home 40 mg twice daily which has not seemed to help improve his fluid.  He is waiting for neurology evaluation. In terms of his elevated troponin he does not have any anginal symptoms I suspect this is in the setting of his renal failure as well as his volume overload/type II no need for any ischemic evaluation at this time. He had 3 does need an echocardiogram to reassess his LV function we are going to order this. He does have swollen upper extremity unilateral on the left side will defer to the primary team if they would like to do Doppler ultrasound. In terms of his elevated blood pressure hoping that once he gets his dialysis he will feel better We will continue to follow with you.   Arlando Leisinger DO, MS [facility] Attending Cardiologist [facility]  [address] [place], [place] [address] [phone] Website: [email or web address]

## 2023-08-28 NOTE — Care Management Obs Status (Signed)
 MEDICARE OBSERVATION STATUS NOTIFICATION   Patient Details  Name: Chase Clark MRN: [id number] Date of Birth: 12/30/1965   Medicare Observation Status Notification Given:  Yes  Letter signed and copy to be mailed  Wynonia Hedges 08/28/2023, 12:11 PM

## 2023-08-28 NOTE — Procedures (Signed)
 I have reviewed the PD prescription and made appropriate changes as needed.  Larry Poag MD  [facility] 08/28/2023, 3:29 PM

## 2023-08-28 NOTE — Progress Notes (Signed)
 PD tx initation note:   Pre TX VS: 130/68BP, 74 pulse, 18 respirations, 98% on room air, 99.9 degrees F from axillary  Pre Weight: 119kg    PD treatment initiated via aseptic technique. Consent signed and in chart. Patient is alert and oriented. No complaints of pain. No specimen collected. PD exit site clean, dry and intact. Gentamycin and new dressing applied. Bedside RN educated on PD machine and how to contact tech support when PD machine alarms.PD tx initation note:

## 2023-08-28 NOTE — Plan of Care (Signed)

## 2023-08-28 NOTE — Consult Note (Signed)
 Renal Service Consult Note [name]  Chase Clark 08/28/2023 Chase Sandifer, MD Requesting Physician: Dr. Landrum Pink  Reason for Consult: ESRD pt on PD here for cough HPI: The patient is a 58 y.o. year-old w/ PMH as below who presented to ED yesterday morning c/o cough, congestion and chest tightness x 1 week. In ED BP 179/99, HR 75, RR 11-18, temp 97.9, RA at 99-100%.  Per ED notes has been coughing a lot over the last week and his upper back muscles hurt from coughing.  No fever no shortness of breath.  No sore throat, occasional runny nose.  He did not do his peritoneal dialysis the night before.  He was hospitalized in mid April due to a baclofen reaction.  Labs showed K+ 4.6, BUN 42, creatinine 8.2 albumin  3.2 LFTs okay, BNP 54, Hgb 10.9, WBC 10K.  Chest x-ray showed no acute disease, CT scan of chest was also negative.  Patient was admitted for chest pain and cough.  We are asked to see for dialysis.  Patient seen   Pt seen in the ED room.  Patient is blind and pleasant, his wife provided most of the history which is a cough getting worse over the last week.  The chest pains are after coughing.  He is also swollen up in his arms but not so much in his legs.  He has missed 2 or 3 nights of PD here.    ROS - denies CP, no joint pain, no HA, no blurry vision, no rash, no diarrhea, no nausea/ vomiting  PMH: Anemia Asthma Blind both eyes ESRD on PD Diabetes was type II now type I DKA Fatty liver Gastroparesis Gilbert's disease Hypertension Hyperlipidemia Left BKA History of CABG CAD  Past Surgical History  Past Surgical History:  Procedure Laterality Date   ABDOMINAL AORTOGRAM N/A 05/20/2016   Procedure: Abdominal Aortogram possible intervention;  Surgeon: Jackquelyn Mass, MD;  Location: [facility] INVASIVE CV [facility];  Service: Cardiovascular;  Laterality: N/A;   AMPUTATION Left 09/05/2017   Procedure: AMPUTATION BELOW KNEE;  Surgeon: Sammye Cristal, MD;   Location: [facility] OR;  Service: Orthopedics;  Laterality: Left;   ANTERIOR CERVICAL DECOMP/DISCECTOMY FUSION N/A 05/07/2017   Procedure: ANTERIOR CERVICAL DECOMPRESSION/DISCECTOMY Remer Cargo PROSTHESIS,PLATE/SCREWS CERVICAL FIVE - CERVICAL SIX;  Surgeon: Garry Kansas, MD;  Location: [facility] OR;  Service: Neurosurgery;  Laterality: N/A;   CAPD INSERTION N/A 08/30/2020   Procedure: LAPAROSCOPIC INSERTION CONTINUOUS AMBULATORY PERITONEAL DIALYSIS  (CAPD) CATHETER;  Surgeon: Emmalene Hare, MD;  Location: [facility] ORS;  Service: General;  Laterality: N/A;   CATARACT EXTRACTION W/ INTRAOCULAR LENS IMPLANT     CATARACT EXTRACTION W/PHACO Left 02/26/2017   Procedure: CATARACT EXTRACTION PHACO AND INTRAOCULAR LENS PLACEMENT (IOC);  Surgeon: Rosa College, MD;  Location: [facility] ORS;  Service: Ophthalmology;  Laterality: Left;  Lot # [id number] H US : 00:25.3 AP%: 6.2 CDE: 1.59    CHOLECYSTECTOMY N/A    COLONOSCOPY WITH PROPOFOL  N/A 05/30/2021   Procedure: COLONOSCOPY WITH PROPOFOL ;  Surgeon: Nannette Babe, MD;  Location: [facility];  Service: Gastroenterology;  Laterality: N/A;   CORONARY ANGIOPLASTY WITH STENT PLACEMENT  11/20/2009   80% D1 stenosis - 2.5 x 18 mm Xience V Everolimus (DES) stent x 1 placed; LOCATION: [facility]; SURGEON: Starlette Ebbs, MD   CORONARY ARTERY BYPASS GRAFT N/A 01/27/2019   Procedure: OFF PUMP CORONARY ARTERY BYPASS GRAFTING (CABG) X 2 WITH ENDOSCOPIC HARVESTING OF RIGHT GREATER SAPHENOUS VEIN. LIMA TO LAD;  Surgeon: Hilarie Lovely, MD;  Location: [facility] OR;  Service: Open Heart Surgery;  Laterality: N/A;   CORONARY STENT INTERVENTION N/A 01/21/2019   Procedure: CORONARY STENT INTERVENTION;  Surgeon: Arty Binning, MD;  Location: [facility];  Service: Cardiovascular;  Laterality: N/A;   EPIBLEPHERON REPAIR WITH TEAR DUCT PROBING     EYE SURGERY Right 02/09/2014   cataract extraction   INSERTION EXPRESS TUBE SHUNT Right 09/06/2015   Procedure: INSERTION AHMED TUBE SHUNT with  tutoplast allograft;  Surgeon: Rosa College, MD;  Location: [facility] ORS;  Service: Ophthalmology;  Laterality: Right;   laparoscopic insertion continuous peritoneal dialysis  2022   LEFT HEART CATH AND CORONARY ANGIOGRAPHY N/A 01/20/2019   Procedure: LEFT HEART CATH AND CORONARY ANGIOGRAPHY;  Surgeon: Avanell Leigh, MD;  Location: [facility];  Service: Cardiovascular;  Laterality: N/A;   POLYPECTOMY  05/30/2021   Procedure: POLYPECTOMY;  Surgeon: Nannette Babe, MD;  Location: [facility];  Service: Gastroenterology;;   TEE WITHOUT CARDIOVERSION N/A 01/27/2019   Procedure: TRANSESOPHAGEAL ECHOCARDIOGRAM (TEE);  Surgeon: Hilarie Lovely, MD;  Location: [facility] OR;  Service: Open Heart Surgery;  Laterality: N/A;   VASECTOMY     Family History  Family History  Problem Relation Age of Onset   Hyperlipidemia Mother    Diabetes Mother    Liver disease Mother        NASH   Other Mother        immune thrombocytopenic pupura (ITP)   Heart attack Father    Hyperlipidemia Father    Diabetes Father    Hypertension Father    Lymphoma Father    Hemochromatosis Other        Grandmother (? which side)   Polycythemia Other    Colon cancer Neg Hx    Esophageal cancer Neg Hx    Rectal cancer Neg Hx    Stomach cancer Neg Hx    Social History  reports that he quit smoking about 15 years ago. His smoking use included cigarettes. He started smoking about 35 years ago. He has a 10 pack-year smoking history. He has never used smokeless tobacco. He reports that he does not currently use alcohol. He reports that he does not use drugs. Allergies  Allergies  Allergen Reactions   Baclofen Other (See Comments)    Disorientation, Contraindication with dialysis    Nsaids Other (See Comments)    CKD on dialysis    Statins Other (See Comments)    Muscle pain   Home medications Prior to Admission medications   Medication Sig Start Date End Date Taking? Authorizing Provider  acetaminophen   (TYLENOL ) 325 MG tablet Take 1 tablet (325 mg total) by mouth every 6 (six) hours as needed for mild pain (pain score 1-3 or temp > 100.5). 09/06/17  Yes Laliberte, Danielle, PA-C  amLODipine  (NORVASC ) 10 MG tablet Take 1 tablet (10 mg total) by mouth daily. 07/30/23  Yes Pokhrel, Laxman, MD  aspirin  (ASPIRIN  CHILDRENS) 81 MG chewable tablet Chew 1 tablet (81 mg total) by mouth daily. 03/04/19  Yes Lightfoot, Marinell Siad, MD  atorvastatin  (LIPITOR ) 80 MG tablet Take 1 tablet (80 mg total) by mouth at bedtime. 08/06/23  Yes Kaur, Charanpreet, NP  brimonidine -timolol  (COMBIGAN ) 0.2-0.5 % ophthalmic solution Place 1 drop into both eyes in the morning and at bedtime.   Yes [provider]  Cholecalciferol (VITAMIN D3) 1000 units CAPS Take 1,000 Units by mouth in the morning.   Yes [provider]  Continuous Glucose Sensor [facility]  G7 SENSOR) MISC USE 1 DEVICE AS DIRECTED Patient taking differently: Inject 1 Device into the skin See admin instructions. Place 1 new sensor into the skin every 10 days 09/03/22  Yes Shamleffer, Ibtehal Jaralla, MD  dorzolamide  (TRUSOPT ) 2 % ophthalmic solution Place 1 drop into both eyes 2 (two) times daily. 10/18/19  Yes [provider]  esomeprazole  (NEXIUM ) 20 MG capsule Take 20 mg by mouth daily before breakfast.   Yes [provider]  ezetimibe  (ZETIA ) 10 MG tablet Take 1 tablet (10 mg total) by mouth at bedtime. 08/03/23  Yes Tona Francis, NP  FLOMAX  0.4 MG CAPS capsule Take 0.4 mg by mouth at bedtime. 04/03/21  Yes [provider]  fluticasone  (FLONASE ) 50 MCG/ACT nasal spray SPRAY 2 SPRAYS INTO EACH NOSTRIL EVERY DAY Patient taking differently: Place 2 sprays into both nostrils daily as needed for allergies or rhinitis. 11/14/19  Yes Kent Pear, MD  gabapentin  (NEURONTIN ) 300 MG capsule Take 1 capsule (300 mg total) by mouth 2 (two) times daily. 07/30/23  Yes Pokhrel, Laxman, MD  gentamicin  cream (GARAMYCIN ) 0.1 % Apply 1  application  topically See admin instructions. Apply around port site as directed 04/05/21  Yes [provider]  insulin  degludec (TRESIBA  FLEXTOUCH) 200 UNIT/ML FlexTouch Pen Inject 42 Units into the skin in the morning and at bedtime. 09/24/22  Yes Shamleffer, Ibtehal Jaralla, MD  insulin  lispro (HUMALOG  KWIKPEN) 100 UNIT/ML KwikPen Max daily 45 units Patient taking differently: Inject 0-15 Units into the skin 2 (two) times daily as needed (Meals, high blood sugar). Per sliding scale 09/18/22  Yes Shamleffer, Ibtehal Jaralla, MD  Insulin  NPH, Human,, Isophane, (HUMULIN  N KWIKPEN) 100 UNIT/ML Kiwkpen Inject 70 Units into the skin daily in the afternoon. To be taken at the start of dialysis Patient taking differently: Inject 70 Units into the skin See admin instructions. Inject 70 units into the skin at bedtime - just before dialysis 09/24/22  Yes Shamleffer, Ibtehal Jaralla, MD  KLOR-CON  M20 20 MEQ tablet Take 20 mEq by mouth in the morning.   Yes [provider]  lactulose  (CHRONULAC ) 10 GM/15ML solution TAKE 30 MLS (20 G TOTAL) BY MOUTH AS NEEDED. Patient taking differently: Take 20 g by mouth daily as needed for mild constipation. 11/02/20  Yes Pyrtle, Amber Bail, MD  latanoprost  (XALATAN ) 0.005 % ophthalmic solution Place 1 drop into both eyes in the morning.   Yes [provider]  metoCLOPramide  (REGLAN ) 10 MG tablet Take 1 tablet (10 mg total) by mouth at bedtime. 02/06/23  Yes McMichael, Bayley M, PA-C  midodrine (PROAMATINE) 5 MG tablet Take 5 mg by mouth 2 (two) times daily as needed (if the Systolic number is less than 100). 09/27/20  Yes [provider]  mometasone  (ELOCON ) 0.1 % cream Apply 1 Application topically as directed. Qd to bid to aa right ankle, right lower leg until improved, then prn flares Patient taking differently: Apply 1 Application topically See admin instructions. Apply to affected area of the ankle and lower leg 1-2 times a day until improvement  is seen, then as needed for flares 03/30/23  Yes Stewart, Tara, MD  Multiple Vitamin (MULTIVITAMIN ADULT PO) Take 1 tablet by mouth daily.   Yes [provider]  ondansetron  (ZOFRAN -ODT) 4 MG disintegrating tablet Take 4 mg by mouth every 8 (eight) hours as needed for nausea or vomiting (dissolve orally).   Yes [provider]  senna (SENOKOT) 8.6 MG tablet Take 2 tablets by mouth in the  morning.   Yes [provider]  Testosterone  20.25 MG/ACT (1.62%) GEL Place 2 Pump onto the skin daily in the afternoon. Patient taking differently: Place 2 Pump onto the skin daily in the afternoon. Each shoulder 05/15/23  Yes Shamleffer, Ibtehal Jaralla, MD  torsemide  (DEMADEX ) 20 MG tablet Take 40 mg by mouth 2 (two) times daily. At breakfast and lunchtime   Yes [provider]  Vitamin D, Ergocalciferol, (DRISDOL) 1.25 MG (50000 UNIT) CAPS capsule Take 50,000 Units by mouth every 30 (thirty) days.   Yes [provider]     Vitals:   08/28/23 0752 08/28/23 1142 08/28/23 1233 08/28/23 1235  BP: (!) 151/91 (!) 203/96 (!) 190/88 (!) 149/82  Pulse: 78 78 82 78  Resp: 19 18 17    Temp: 98.3 F (36.8 C) 98.1 F (36.7 C) 98.8 F (37.1 C) 98.7 F (37.1 C)  TempSrc: Oral Oral Oral Oral  SpO2: 99% 97% 98% 97%  Weight:      Height:       Exam Gen alert, no distress, blind in both eyes, pleasant No rash, cyanosis or gangrene Sclera anicteric, throat clear  No jvd or bruits Chest clear bilat to bases, no rales/ wheezing RRR no MRG Abd soft ntnd no mass or ascites +bs, some lower abdominal wall edema GU normal male MS no joint effusions or deformity Ext 1+ RUE > LUE, lower abd wall edema, some dependent hip edema, no other edema Neuro is alert, Ox 3 , nf    PD cath mid abdomen       Renal-related home meds: Norvasc  10 daily Klor-Con  20 mill equivalents daily Midodrine 5 mg twice daily as needed Torsemide  20 to 40 mg p.o., may have been  discontinued Others: Insulins, Neurontin , Zetia , PPI, statin, aspirin , lactulose , Flomax     OP PD:  needs updating from April 2025--> CCPD, 5 cycles, 2.4 L fills, 1.5 hrs fill, 7000 units heparin  per 6 L dialysate bag.    CXR 5/15 - no acute disease     Assessment/ Plan: Cough/chest discomfort: Chest x-ray and CT scan of chest are negative, patient is on room air and has clear lungs on exam.  Per primary MD ESRD: on PD 7 nights a week followed by Dr. Zelda Hickman in [place].  Plan PD nightly while here. HTN: Blood pressures are on the high side, continue home meds and get volume off with peritoneal dialysis Volume: Mild to moderate edema of the upper extremities, abdominal wall and bilateral hip areas.  Maximal UF with peritoneal dialysis using 4.25% dextrose  fluids.  This will take a while typically with PD.  Agree w/ torsemide  as well at 40mg  bid for now, he is voiding.  Anemia of esrd: Hemoglobin 10-11 here, follow. Secondary hyperparathyroidism: Corrected calcium  WNL, add on Phos. Diabetes: was type II now type I      Larry Poag  MD [facility] 08/28/2023, 3:01 PM  Recent Labs  Lab 08/27/23 0552 08/27/23 2211  HGB 10.9*  --   ALBUMIN  3.2*  --   CALCIUM  8.5* 8.3*  CREATININE 8.24* 9.08*  K 4.6 5.1   Inpatient medications:  amLODipine   10 mg Oral Daily   aspirin   81 mg Oral Daily   atorvastatin   80 mg Oral QHS   azelastine   2 spray Each Nare BID   brimonidine   1 drop Both Eyes Q12H   And   timolol   1 drop Both Eyes BID   dorzolamide   1 drop Both Eyes BID   ezetimibe   10 mg Oral QHS   fluticasone   2 spray Each Nare Daily   gabapentin   300 mg Oral BID   heparin  injection (subcutaneous)  5,000 Units Subcutaneous Q8H   insulin  aspart  0-15 Units Subcutaneous TID WC   insulin  NPH Human  30 Units Subcutaneous QHS   latanoprost   1 drop Both Eyes QHS   loratadine  10 mg Oral Daily   pantoprazole   40 mg Oral BID   senna  17.2 mg Oral Daily   torsemide   40 mg Oral BID     acetaminophen , albuterol , guaiFENesin, hydrALAZINE , midodrine

## 2023-08-28 NOTE — Progress Notes (Signed)
 MEWS Progress Note  Patient Details Name: Chase Clark MRN: [id number] DOB: 1965-08-13 Today's Date: 08/28/2023   MEWS Flowsheet Documentation:  Assess: MEWS Score Temp: 98.1 F (36.7 C) BP: (!) 203/96 MAP (mmHg): 121 Pulse Rate: 78 ECG Heart Rate: 81 Resp: 18 Level of Consciousness: Alert SpO2: 97 % O2 Device: Room Air Assess: MEWS Score MEWS Temp: 0 MEWS Systolic: 2 MEWS Pulse: 0 MEWS RR: 0 MEWS LOC: 0 MEWS Score: 2 MEWS Score Color: Yellow Assess: SIRS CRITERIA SIRS Temperature : 0 SIRS Respirations : 0 SIRS Pulse: 0 SIRS WBC: 0 SIRS Score Sum : 0 SIRS Temperature : 0 SIRS Pulse: 0 SIRS Respirations : 0 SIRS WBC: 0 SIRS Score Sum : 0 Assess: if the MEWS score is Yellow or Red Were vital signs accurate and taken at a resting state?: Yes Does the patient meet 2 or more of the SIRS criteria?: No MEWS guidelines implemented : Yes, yellow Treat MEWS Interventions: Considered administering scheduled or prn medications/treatments as ordered Take Vital Signs Increase Vital Sign Frequency : Yellow: Q2hr x1, continue Q4hrs until patient remains green for 12hrs Escalate MEWS: Escalate: Yellow: Discuss with charge nurse and consider notifying provider and/or RRT Notify: Charge Nurse/RN Name of Charge Nurse/RN Notified: Deanna RN/ Research scientist (physical sciences) Provider Notification Provider Name/Title: Dr. Landrum Pink Date Provider Notified: 08/28/23 Time Provider Notified: 1145 Method of Notification:  (Secure Chat) Notification Reason: Other (Comment) (Elevated BP) Provider response: Evaluate remotely, See new orders Date of Provider Response: 08/28/23 Time of Provider Response: 1150   [name] 08/28/2023, 12:01 PM

## 2023-08-28 NOTE — H&P (Signed)
 History and Physical    Patient: Chase Clark ZOX:[id number] DOB: 1966/02/27 DOA: 08/27/2023 DOS: the patient was seen and examined on 08/28/2023 PCP: Jacklin Mascot, MD  Patient coming from: Home  Chief Complaint:  Chief Complaint  Patient presents with   Cough   HPI: Chase Clark is a 58 y.o. male with medical history significant for diabetes mellitus which was type II for many years but recently converted into type I per the endocrinologist note. He also has a history of end-stage renal disease on peritoneal dialysis, hypertension, and diabetic leg ulcer  who presents for evaluation of severe cough.  The patient says his cough has been worsening over the last month and now whenever he coughs it causes severe pain.  He reported chest tightness to the ED physician but to me he says the discomfort is mainly right after he coughs.  Denies any shortness of breath or fever.   In the emergency department his evaluation revealed that he had an elevated troponin.  The plan was to admit him to the hospital and monitor.  Cardiology consultation was ordered      Review of Systems: As mentioned in the history of present illness. All other systems reviewed and are negative. Past Medical History:  Diagnosis Date   Anemia    Arthritis    Asthma    as child   Atypical mole 09/22/2022   R spinal mid back, needs excision   Barrett's esophagus 02/16/2014   short segment   Cataract    Chronic kidney disease (CKD), stage IV (severe) (HCC)    Coronary artery disease    DDD (degenerative disc disease), cervical    Diabetes mellitus without complication (HCC)    Diabetic osteomyelitis (HCC)    Diabetic peripheral neuropathy (HCC)    Diabetic ulcer of foot with muscle involvement without evidence of necrosis (HCC)    DIABETIC ULCERATIONS ASSOCIATED WITH IRRITATION LATERAL ANKLE LEFT GREATER THAN RIGHT WITH MILD CELLULITIS    Diverticulosis    DKA (diabetic ketoacidoses) 08/23/2017   Edema,  lower extremity    Fatty liver    Gastroparesis 2015   GERD (gastroesophageal reflux disease)    Gilbert's disease    Glaucoma    Hx of BKA, left (HCC)    Hx of CABG 01/27/2019   2 vessels; LIMA-LAD, SVG-D1   Hx of heart artery stent 11/20/2009   80% D1 stenosis - 2.5 x 18 mm Xience V Everolimus (DES) stent x 1 placed   Hyperlipidemia    Hypertension    Left below-knee amputee (HCC)    Legally blind    Myocardial infarction (HCC) 11/2009   Neuropathy    Osteoarthritis of spine    Osteomyelitis of left foot (HCC)    Peritoneal dialysis catheter in place [facility])    Does dialysis over night.   PONV (postoperative nausea and vomiting)    PVD (peripheral vascular disease) (HCC)    Seasonal allergies    Shoulder pain    Tubular adenoma of colon    Ulcer of foot (HCC) 11/17/2012   DIABETIC ULCERATIONS ASSOCIATED WITH IRRITATION LATERAL ANKLE LEFT GREATER THAN RIGHT WITH MILD CELLULITIS   Past Surgical History:  Procedure Laterality Date   ABDOMINAL AORTOGRAM N/A 05/20/2016   Procedure: Abdominal Aortogram possible intervention;  Surgeon: Jackquelyn Mass, MD;  Location: [facility] IN[facility]IVE [facility];  Service: Cardiovascular;  Laterality: N/A;   AMPUTATION Left 09/05/2017   Procedure: AMPUTATION BELOW KNEE;  Surgeon: Sammye Cristal, MD;  Location:  [facility] OR;  Service: Orthopedics;  Laterality: Left;   ANTERIOR CERVICAL DECOMP/DISCECTOMY FUSION N/A 05/07/2017   Procedure: ANTERIOR CERVICAL DECOMPRESSION/DISCECTOMY Remer Cargo PROSTHESIS,PLATE/SCREWS CERVICAL FIVE - CERVICAL SIX;  Surgeon: Garry Kansas, MD;  Location: [facility] OR;  Service: Neurosurgery;  Laterality: N/A;   CAPD INSERTION N/A 08/30/2020   Procedure: LAPAROSCOPIC INSERTION CONTINUOUS AMBULATORY PERITONEAL DIALYSIS  (CAPD) CATHETER;  Surgeon: Emmalene Hare, MD;  Location: [facility] ORS;  Service: General;  Laterality: N/A;   CATARACT EXTRACTION W/ INTRAOCULAR LENS IMPLANT     CATARACT EXTRACTION W/PHACO Left 02/26/2017   Procedure:  CATARACT EXTRACTION PHACO AND INTRAOCULAR LENS PLACEMENT (IOC);  Surgeon: Rosa College, MD;  Location: [facility] ORS;  Service: Ophthalmology;  Laterality: Left;  Lot # [id number] H US : 00:25.3 AP%: 6.2 CDE: 1.59    CHOLECYSTECTOMY N/A    COLONOSCOPY WITH PROPOFOL  N/A 05/30/2021   Procedure: COLONOSCOPY WITH PROPOFOL ;  Surgeon: Nannette Babe, MD;  Location: [facility];  Service: Gastroenterology;  Laterality: N/A;   CORONARY ANGIOPLASTY WITH STENT PLACEMENT  11/20/2009   80% D1 stenosis - 2.5 x 18 mm Xience V Everolimus (DES) stent x 1 placed; LOCATION: [facility]; SURGEON: Starlette Ebbs, MD   CORONARY ARTERY BYPASS GRAFT N/A 01/27/2019   Procedure: OFF PUMP CORONARY ARTERY BYPASS GRAFTING (CABG) X 2 WITH ENDOSCOPIC HARVESTING OF RIGHT GREATER SAPHENOUS VEIN. LIMA TO LAD;  Surgeon: Hilarie Lovely, MD;  Location: [facility] OR;  Service: Open Heart Surgery;  Laterality: N/A;   CORONARY STENT INTERVENTION N/A 01/21/2019   Procedure: CORONARY STENT INTERVENTION;  Surgeon: Arty Binning, MD;  Location: [facility];  Service: Cardiovascular;  Laterality: N/A;   EPIBLEPHERON REPAIR WITH TEAR DUCT PROBING     EYE SURGERY Right 02/09/2014   cataract extraction   INSERTION EXPRESS TUBE SHUNT Right 09/06/2015   Procedure: INSERTION AHMED TUBE SHUNT with tutoplast allograft;  Surgeon: Rosa College, MD;  Location: [facility] ORS;  Service: Ophthalmology;  Laterality: Right;   laparoscopic insertion continuous peritoneal dialysis  2022   LEFT HEART CATH AND CORONARY ANGIOGRAPHY N/A 01/20/2019   Procedure: LEFT HEART CATH AND CORONARY ANGIOGRAPHY;  Surgeon: Avanell Leigh, MD;  Location: [facility];  Service: Cardiovascular;  Laterality: N/A;   POLYPECTOMY  05/30/2021   Procedure: POLYPECTOMY;  Surgeon: Nannette Babe, MD;  Location: [facility];  Service: Gastroenterology;;   TEE WITHOUT CARDIOVERSION N/A 01/27/2019   Procedure: TRANSESOPHAGEAL ECHOCARDIOGRAM (TEE);  Surgeon: Hilarie Lovely,  MD;  Location: [facility] OR;  Service: Open Heart Surgery;  Laterality: N/A;   VASECTOMY     Social History:  reports that he quit smoking about 15 years ago. His smoking use included cigarettes. He started smoking about 35 years ago. He has a 10 pack-year smoking history. He has never used smokeless tobacco. He reports that he does not currently use alcohol. He reports that he does not use drugs.  Allergies  Allergen Reactions   Baclofen Other (See Comments)    Disorientation, possibly    Family History  Problem Relation Age of Onset   Hyperlipidemia Mother    Diabetes Mother    Liver disease Mother        NASH   Other Mother        immune thrombocytopenic pupura (ITP)   Heart attack Father    Hyperlipidemia Father    Diabetes Father    Hypertension Father    Lymphoma Father    Hemochromatosis Other        Grandmother (?  which side)   Polycythemia Other    Colon cancer Neg Hx    Esophageal cancer Neg Hx    Rectal cancer Neg Hx    Stomach cancer Neg Hx     Prior to Admission medications   Medication Sig Start Date End Date Taking? Authorizing Provider  acetaminophen  (TYLENOL ) 325 MG tablet Take 1 tablet (325 mg total) by mouth every 6 (six) hours as needed for mild pain (pain score 1-3 or temp > 100.5). 09/06/17   Laliberte, Danielle, PA-C  amLODipine  (NORVASC ) 10 MG tablet Take 1 tablet (10 mg total) by mouth daily. Patient not taking: Reported on 08/06/2023 07/30/23   Pokhrel, Laxman, MD  aspirin  (ASPIRIN  CHILDRENS) 81 MG chewable tablet Chew 1 tablet (81 mg total) by mouth daily. 03/04/19   Hilarie Lovely, MD  atorvastatin  (LIPITOR ) 80 MG tablet Take 1 tablet (80 mg total) by mouth at bedtime. 08/06/23   Kaur, Charanpreet, NP  azelastine  (ASTELIN ) 0.1 % nasal spray Place 2 sprays into both nostrils 2 (two) times daily. Use in each nostril as directed 09/16/22   Kent Pear, MD  brimonidine -timolol  (COMBIGAN ) 0.2-0.5 % ophthalmic solution Place 1 drop into both eyes in the  morning and at bedtime.    [provider]  Cholecalciferol (VITAMIN D3) 1000 units CAPS Take 1,000 Units by mouth in the morning.    [provider]  Continuous Glucose Sensor (DEXCOM G7 SENSOR) MISC USE 1 DEVICE AS DIRECTED Patient taking differently: Inject 1 Device into the skin See admin instructions. Place 1 new sensor into the skin every 10 days 09/03/22   Shamleffer, Julian Obey, MD  dorzolamide  (TRUSOPT ) 2 % ophthalmic solution Place 1 drop into both eyes 2 (two) times daily. 10/18/19   [provider]  esomeprazole  (NEXIUM ) 20 MG capsule Take 20 mg by mouth daily before breakfast.    [provider]  ezetimibe  (ZETIA ) 10 MG tablet Take 1 tablet (10 mg total) by mouth at bedtime. 08/03/23   Tona Francis, NP  FLOMAX  0.4 MG CAPS capsule Take 0.4 mg by mouth at bedtime. 04/03/21   [provider]  fluticasone  (FLONASE ) 50 MCG/ACT nasal spray SPRAY 2 SPRAYS INTO EACH NOSTRIL EVERY DAY Patient taking differently: Place 2 sprays into both nostrils daily as needed for allergies or rhinitis. 11/14/19   Kent Pear, MD  gabapentin  (NEURONTIN ) 300 MG capsule Take 1 capsule (300 mg total) by mouth 2 (two) times daily. 07/30/23   Pokhrel, Laxman, MD  gentamicin  cream (GARAMYCIN ) 0.1 % Apply 1 application  topically See admin instructions. Apply around port site as directed 04/05/21   [provider]  insulin  degludec (TRESIBA  FLEXTOUCH) 200 UNIT/ML FlexTouch Pen Inject 42 Units into the skin in the morning and at bedtime. 09/24/22   Shamleffer, Ibtehal Jaralla, MD  insulin  lispro (HUMALOG  KWIKPEN) 100 UNIT/ML KwikPen Max daily 45 units Patient taking differently: Inject 15 Units into the skin See admin instructions. Inject 15 units into the skin three times a day with meals, per sliding scale- max of 45 units/day 09/18/22   Shamleffer, Ibtehal Jaralla, MD  Insulin  NPH, Human,, Isophane, (HUMULIN  N KWIKPEN) 100 UNIT/ML Kiwkpen Inject 70 Units  into the skin daily in the afternoon. To be taken at the start of dialysis Patient taking differently: Inject 70 Units into the skin See admin instructions. Inject 70 units into the skin at bedtime- at the start of dialysis 09/24/22   Shamleffer, Ibtehal Jaralla, MD  Insulin  Pen Needle 32G X 4  MM MISC Inject 1 Device into the skin 5 (five) times daily. Use to inject Lantus  and Novolog  up to 5 times daily 11/15/20   Shamleffer, Julian Obey, MD  KLOR-CON  M20 20 MEQ tablet Take 20 mEq by mouth in the morning.    [provider]  lactulose  (CHRONULAC ) 10 GM/15ML solution TAKE 30 MLS (20 G TOTAL) BY MOUTH AS NEEDED. Patient taking differently: Take 20 g by mouth daily as needed for mild constipation. 11/02/20   Pyrtle, Amber Bail, MD  latanoprost  (XALATAN ) 0.005 % ophthalmic solution Place 1 drop into both eyes in the morning.    [provider]  metoCLOPramide  (REGLAN ) 10 MG tablet Take 1 tablet (10 mg total) by mouth at bedtime. 02/06/23   McMichael, Elba Greathouse, PA-C  midodrine (PROAMATINE) 5 MG tablet Take 5 mg by mouth 2 (two) times daily as needed (if the Systolic number is less than 100). 09/27/20   [provider]  mometasone  (ELOCON ) 0.1 % cream Apply 1 Application topically as directed. Qd to bid to aa right ankle, right lower leg until improved, then prn flares Patient taking differently: Apply 1 Application topically See admin instructions. Apply to affected area of the ankle and lower leg 1-2 times a day until improvement is seen, then as needed for flares 03/30/23   Stewart, Tara, MD  Multiple Vitamin (MULTIVITAMIN ADULT PO) Take 1 tablet by mouth daily.    [provider]  ondansetron  (ZOFRAN -ODT) 4 MG disintegrating tablet Take 4 mg by mouth every 8 (eight) hours as needed for nausea or vomiting (dissolve orally).    [provider]  senna (SENOKOT) 8.6 MG tablet Take 2 tablets by mouth in the morning.    [provider]  Testosterone  20.25 MG/ACT  (1.62%) GEL Place 2 Pump onto the skin daily in the afternoon. 05/15/23   Shamleffer, Ibtehal Jaralla, MD  torsemide  (DEMADEX ) 20 MG tablet Take 20-40 mg by mouth See admin instructions. Take 40 mg by mouth in the morning and 40 mg at lunchtime    [provider]  Vitamin D, Ergocalciferol, (DRISDOL) 1.25 MG (50000 UNIT) CAPS capsule Take 50,000 Units by mouth every 30 (thirty) days.    [provider]    Physical Exam: Vitals:   08/27/23 1851 08/27/23 1900 08/27/23 2023 08/28/23 0128  BP: (!) 174/100  (!) 159/93 (!) 140/92  Pulse: 77  84 87  Resp: 19  18 18   Temp:  98.1 F (36.7 C) 98 F (36.7 C) 98.7 F (37.1 C)  TempSrc:   Oral Oral  SpO2: 99%  98% 97%  Weight:      Height:       Physical Exam:  General: No acute distress, well developed, well nourished HEENT: Normocephalic, atraumatic. blind Cardiovascular: Normal rate and rhythm. Distal pulses intact. Pulmonary: Normal pulmonary effort, normal breath sounds Gastrointestinal: Nondistended abdomen, soft, non-tender, normoactive bowel sounds Musculoskeletal:edema in thighs not ankles or stump Skin: Skin is warm and dry. Neuro: No focal deficits noted, AAOx3. PSYCH: Attentive and cooperative  Data Reviewed:  Results for orders placed or performed during the hospital encounter of 08/27/23 (from the past 24 hours)  Comprehensive metabolic panel     Status: Abnormal   Collection Time: 08/27/23  5:52 AM  Result Value Ref Range   Sodium 133 (L) 135 - 145 mmol/L   Potassium 4.6 3.5 - 5.1 mmol/L   Chloride 97 (L) 98 - 111 mmol/L   CO2 22 22 - 32 mmol/L   Glucose,  Bld 115 (H) 70 - 99 mg/dL   BUN 42 (H) 6 - 20 mg/dL   Creatinine, Ser 1.61 (H) 0.61 - 1.24 mg/dL   Calcium  8.5 (L) 8.9 - 10.3 mg/dL   Total Protein 6.2 (L) 6.5 - 8.1 g/dL   Albumin  3.2 (L) 3.5 - 5.0 g/dL   AST 23 15 - 41 U/L   ALT 22 0 - 44 U/L   Alkaline Phosphatase 145 (H) 38 - 126 U/L   Total Bilirubin 0.3 0.0 - 1.2 mg/dL   GFR, Estimated 7  (L) >60 mL/min   Anion gap 14 5 - 15  CBC with Differential     Status: Abnormal   Collection Time: 08/27/23  5:52 AM  Result Value Ref Range   WBC 10.1 4.0 - 10.5 K/uL   RBC 3.30 (L) 4.22 - 5.81 MIL/uL   Hemoglobin 10.9 (L) 13.0 - 17.0 g/dL   HCT 09.6 (L) 04.5 - 40.9 %   MCV 100.6 (H) 80.0 - 100.0 fL   MCH 33.0 26.0 - 34.0 pg   MCHC 32.8 30.0 - 36.0 g/dL   RDW 81.1 91.4 - 78.2 %   Platelets 176 150 - 400 K/uL   nRBC 0.0 0.0 - 0.2 %   Neutrophils Relative % 69 %   Neutro Abs 7.0 1.7 - 7.7 K/uL   Lymphocytes Relative 12 %   Lymphs Abs 1.2 0.7 - 4.0 K/uL   Monocytes Relative 9 %   Monocytes Absolute 0.9 0.1 - 1.0 K/uL   Eosinophils Relative 8 %   Eosinophils Absolute 0.8 (H) 0.0 - 0.5 K/uL   Basophils Relative 1 %   Basophils Absolute 0.1 0.0 - 0.1 K/uL   Immature Granulocytes 1 %   Abs Immature Granulocytes 0.05 0.00 - 0.07 K/uL  Lipase, blood     Status: Abnormal   Collection Time: 08/27/23  5:52 AM  Result Value Ref Range   Lipase 54 (H) 11 - 51 U/L  Troponin T, High Sensitivity     Status: Abnormal   Collection Time: 08/27/23  5:52 AM  Result Value Ref Range   Troponin T High Sensitivity 157 (HH) <19 ng/L  Troponin T, High Sensitivity     Status: Abnormal   Collection Time: 08/27/23  7:41 AM  Result Value Ref Range   Troponin T High Sensitivity 152 (HH) <19 ng/L  CBG monitoring, ED     Status: None   Collection Time: 08/27/23 10:56 AM  Result Value Ref Range   Glucose-Capillary 92 70 - 99 mg/dL  CBG monitoring, ED     Status: Abnormal   Collection Time: 08/27/23  6:38 PM  Result Value Ref Range   Glucose-Capillary 166 (H) 70 - 99 mg/dL  Glucose, capillary     Status: Abnormal   Collection Time: 08/27/23  9:29 PM  Result Value Ref Range   Glucose-Capillary 149 (H) 70 - 99 mg/dL  Troponin I (High Sensitivity)     Status: None   Collection Time: 08/27/23 10:11 PM  Result Value Ref Range   Troponin I (High Sensitivity) 11 <18 ng/L  Basic metabolic panel      Status: Abnormal   Collection Time: 08/27/23 10:11 PM  Result Value Ref Range   Sodium 131 (L) 135 - 145 mmol/L   Potassium 5.1 3.5 - 5.1 mmol/L   Chloride 99 98 - 111 mmol/L   CO2 19 (L) 22 - 32 mmol/L   Glucose, Bld 159 (H) 70 - 99 mg/dL   BUN  45 (H) 6 - 20 mg/dL   Creatinine, Ser 3.01 (H) 0.61 - 1.24 mg/dL   Calcium  8.3 (L) 8.9 - 10.3 mg/dL   GFR, Estimated 6 (L) >60 mL/min   Anion gap 13 5 - 15   *Note: Due to a large number of results and/or encounters for the requested time period, some results have not been displayed. A complete set of results can be found in Results Review.     Assessment and Plan: Worsening Cough and pleuritic pain - volume overload suspected, but will check CT chest. I am hesitant to give contrast.  Very low clinical suspicion for PE.   2. ESRD - nephrology consult for continued peritoneal dialysis.  Perhaps more volume can be taken off through dialysis to see if that helps his cough.  3. DM, insulin  requiring -  The endocrinology note says that he has converted from type II to type I. Continue insulin  once his dose is clarified. He did receive 1 dose of NPH 70 units overnight.  Accu-Cheks and corrective dose insulin  also ordered.    Advance Care Planning:   Code Status: Prior  The patient names his wife as a surrogate decision maker and wants to be full code.  Consults: nephrology in am  Family Communication: none  Severity of Illness: The appropriate patient status for this patient is INPATIENT. Inpatient status is judged to be reasonable and necessary in order to provide the required intensity of service to ensure the patient's safety. The patient's presenting symptoms, physical exam findings, and initial radiographic and laboratory data in the context of their chronic comorbidities is felt to place them at high risk for further clinical deterioration. Furthermore, it is not anticipated that the patient will be medically stable for discharge from  the hospital within 2 midnights of admission.   * I certify that at the point of admission it is my clinical judgment that the patient will require inpatient hospital care spanning beyond 2 midnights from the point of admission due to high intensity of service, high risk for further deterioration and high frequency of surveillance required.*  Author: Willadean Hark, MD 08/28/2023 1:48 AM  For on call review [email or web address].

## 2023-08-28 NOTE — Progress Notes (Signed)
 Transported off unit to CT at this time. Transport Method: Bed Staff member: Patient Transporter.

## 2023-08-29 ENCOUNTER — Other Ambulatory Visit: Payer: Self-pay | Admitting: Internal Medicine

## 2023-08-29 ENCOUNTER — Observation Stay (HOSPITAL_COMMUNITY)

## 2023-08-29 DIAGNOSIS — I5033 Acute on chronic diastolic (congestive) heart failure: Secondary | ICD-10-CM

## 2023-08-29 DIAGNOSIS — N2581 Secondary hyperparathyroidism of renal origin: Secondary | ICD-10-CM | POA: Diagnosis not present

## 2023-08-29 DIAGNOSIS — R051 Acute cough: Secondary | ICD-10-CM | POA: Diagnosis not present

## 2023-08-29 DIAGNOSIS — I120 Hypertensive chronic kidney disease with stage 5 chronic kidney disease or end stage renal disease: Secondary | ICD-10-CM | POA: Diagnosis not present

## 2023-08-29 DIAGNOSIS — N186 End stage renal disease: Secondary | ICD-10-CM | POA: Diagnosis not present

## 2023-08-29 DIAGNOSIS — Z992 Dependence on renal dialysis: Secondary | ICD-10-CM | POA: Diagnosis not present

## 2023-08-29 DIAGNOSIS — E8779 Other fluid overload: Secondary | ICD-10-CM | POA: Diagnosis not present

## 2023-08-29 LAB — GLUCOSE, CAPILLARY
Glucose-Capillary: 387 mg/dL — ABNORMAL HIGH (ref 70–99)
Glucose-Capillary: 169 mg/dL — ABNORMAL HIGH (ref 70–99)
Glucose-Capillary: 161 mg/dL — ABNORMAL HIGH (ref 70–99)
Glucose-Capillary: 306 mg/dL — ABNORMAL HIGH (ref 70–99)

## 2023-08-29 LAB — ECHOCARDIOGRAM COMPLETE
Area-P 1/2: 3.65 cm2
Height: 70 in
S' Lateral: 3.2 cm
Weight: 4158.76 [oz_av]

## 2023-08-29 MED ORDER — INSULIN DEGLUDEC 100 UNIT/ML ~~LOC~~ SOPN
40.0000 [IU] | PEN_INJECTOR | Freq: Two times a day (BID) | SUBCUTANEOUS | Status: DC
Start: 2023-08-29 — End: 2023-08-29

## 2023-08-29 MED ORDER — INSULIN GLARGINE-YFGN 100 UNIT/ML ~~LOC~~ SOLN
32.0000 [IU] | Freq: Two times a day (BID) | SUBCUTANEOUS | Status: DC
  Administered 2023-08-29 – 2023-08-30 (×3): 32 [IU] via SUBCUTANEOUS
  Filled 2023-08-29 (×4): qty 0.32

## 2023-08-29 MED ORDER — INSULIN NPH (HUMAN) (ISOPHANE) 100 UNIT/ML ~~LOC~~ SUSP
60.0000 [IU] | Freq: Every day | SUBCUTANEOUS | Status: DC
  Administered 2023-08-29: 60 [IU] via SUBCUTANEOUS
  Filled 2023-08-29: qty 10

## 2023-08-29 MED ORDER — SEVELAMER CARBONATE 800 MG PO TABS
800.0000 mg | ORAL_TABLET | Freq: Three times a day (TID) | ORAL | Status: DC
  Administered 2023-08-29 – 2023-08-30 (×3): 800 mg via ORAL
  Filled 2023-08-29 (×4): qty 1

## 2023-08-29 MED ORDER — INSULIN GLARGINE-YFGN 100 UNIT/ML ~~LOC~~ SOLN
40.0000 [IU] | Freq: Two times a day (BID) | SUBCUTANEOUS | Status: DC
  Filled 2023-08-29 (×2): qty 0.4

## 2023-08-29 MED ORDER — INSULIN NPH (HUMAN) (ISOPHANE) 100 UNIT/ML ~~LOC~~ SUSP: 40.0000 [IU] | Freq: Every day | SUBCUTANEOUS | Status: DC

## 2023-08-29 MED ORDER — CYCLOBENZAPRINE HCL 5 MG PO TABS
5.0000 mg | ORAL_TABLET | Freq: Two times a day (BID) | ORAL | Status: DC | PRN
  Administered 2023-08-29: 5 mg via ORAL
  Filled 2023-08-29: qty 1

## 2023-08-29 MED ORDER — INSULIN ASPART 100 UNIT/ML IJ SOLN
3.0000 [IU] | Freq: Three times a day (TID) | INTRAMUSCULAR | Status: DC
  Administered 2023-08-29 – 2023-08-30 (×4): 3 [IU] via SUBCUTANEOUS

## 2023-08-29 NOTE — Plan of Care (Signed)
  Problem: Education: Goal: Ability to describe self-care measures that may prevent or decrease complications (Diabetes Survival Skills Education) will improve Outcome: Progressing   Problem: Coping: Goal: Ability to adjust to condition or change in health will improve Outcome: Progressing   Problem: Fluid Volume: Goal: Ability to maintain a balanced intake and output will improve Outcome: Progressing   Problem: Health Behavior/Discharge Planning: Goal: Ability to identify and utilize available resources and services will improve Outcome: Progressing Goal: Ability to manage health-related needs will improve Outcome: Progressing   Problem: Metabolic: Goal: Ability to maintain appropriate glucose levels will improve Outcome: Progressing   Problem: Nutritional: Goal: Maintenance of adequate nutrition will improve Outcome: Progressing   Problem: Skin Integrity: Goal: Risk for impaired skin integrity will decrease Outcome: Progressing   Problem: Tissue Perfusion: Goal: Adequacy of tissue perfusion will improve Outcome: Progressing

## 2023-08-29 NOTE — Progress Notes (Signed)
 PROGRESS NOTE    Chase Clark  WUJ:[id number] DOB: 1966-01-14 DOA: 08/27/2023 PCP: Jacklin Mascot, MD   Brief Narrative: 58 year old with past medical history significant for diabetes type 2 but recently converted into type I.  The endocrinologist notes, ESRD on peritoneal dialysis, hypertension, diabetic leg ulcer who presents with severe cough worse over the last months.  Also reports chest pain from coughing.   Assessment & Plan:   Principal Problem:   Chest pain   1-Cough, pleuritic chest pain: - Presents with worsening cough of the last month. - X-ray:No acute cardiopulmonary abnormality.  -EKG: sinus Rhythm CT chest order.  BNP.54 Will tx post nasal drip, allergies, reflux. PRN albuterol  ordered.  Report some congestion last night\   Mild elevation troponin:  Ho CAD s/p CABG -flat in setting HD -demand ischemia mismatch.  -Cardiology was consulted by Admitter. -ECHO ordered. If ECHO normal , no further evaluation per cardio  ESRD on peritoneal dialysis -Nephrology consulted.  Plan to repeat peritoneal Dialysis tonight in the hospital.   Diabetes mellitus insulin -dependent -SSI Per wife patient uses NPH 70 at bedtime before dialysis. Also use Tresiba  42 units BID. ---resume 40 BID>  Hypomagnesemia:  Replaced IV   Left Arm edema; check Doppler.  Hyponatremia; in setting CKD HTN; Resume Norvasc .   Elevation ALk Phosphatase.  GTT normal. Fu outpatient  Estimated body mass index is 37.29 kg/m as calculated from the following:   Height as of this encounter: 5\' 10"  (1.778 m).   Weight as of this encounter: 117.9 kg.   DVT prophylaxis: heparin   Code Status: Full code Family Communication: Disposition Plan:  Status is: Observation The patient remains OBS appropriate and will d/c before 2 midnights.    Consultants:  Nephrology Cardiology   Procedures:  ECHO  Antimicrobials:    Subjective: Report chest congestion last night. Swelling left  arm for some time now    Objective: Vitals:   08/29/23 0010 08/29/23 0507 08/29/23 0759 08/29/23 1053  BP: 128/70 133/83 129/70 99/62  Pulse: 68 75 80 72  Resp: 18 18 18 19   Temp: 98.3 F (36.8 C) 97.9 F (36.6 C) 97.9 F (36.6 C) 97.9 F (36.6 C)  TempSrc: Oral Oral Oral Oral  SpO2: 96% 96% 97% 99%  Weight:  117.9 kg    Height:        Intake/Output Summary (Last 24 hours) at 08/29/2023 1228 Last data filed at 08/29/2023 1049 Gross per 24 hour  Intake 480 ml  Output 3509 ml  Net -3029 ml   Filed Weights   08/28/23 0448 08/28/23 1800 08/29/23 0507  Weight: 121.2 kg 119 kg 117.9 kg    Examination:  General exam: NAD Respiratory system: CTA Cardiovascular system: S1, S 2 RRR Central nervous system: Alert   Data Reviewed: I have personally reviewed following labs and imaging studies  CBC: Recent Labs  Lab 08/27/23 0552  WBC 10.1  NEUTROABS 7.0  HGB 10.9*  HCT 33.2*  MCV 100.6*  PLT 176   Basic Metabolic Panel: Recent Labs  Lab 08/27/23 0552 08/27/23 2211 08/28/23 1334  NA 133* 131*  --   K 4.6 5.1  --   CL 97* 99  --   CO2 22 19*  --   GLUCOSE 115* 159*  --   BUN 42* 45*  --   CREATININE 8.24* 9.08*  --   CALCIUM  8.5* 8.3*  --   PHOS  --   --  7.2*   GFR: Estimated Creatinine Clearance: 11.1  mL/min (A) (by C-G formula based on SCr of 9.08 mg/dL (H)). Liver Function Tests: Recent Labs  Lab 08/27/23 0552  AST 23  ALT 22  ALKPHOS 145*  BILITOT 0.3  PROT 6.2*  ALBUMIN  3.2*   Recent Labs  Lab 08/27/23 0552  LIPASE 54*   No results for input(s): "AMMONIA" in the last 168 hours. Coagulation Profile: No results for input(s): "INR", "PROTIME" in the last 168 hours. Cardiac Enzymes: No results for input(s): "CKTOTAL", "CKMB", "CKMBINDEX", "TROPONINI" in the last 168 hours. BNP (last 3 results) No results for input(s): "PROBNP" in the last 8760 hours. HbA1C: No results for input(s): "HGBA1C" in the last 72 hours. CBG: Recent Labs  Lab  08/28/23 1139 08/28/23 1514 08/28/23 2052 08/29/23 0558 08/29/23 1048  GLUCAP 130* 165* 276* 387* 169*   Lipid Profile: No results for input(s): "CHOL", "HDL", "LDLCALC", "TRIG", "CHOLHDL", "LDLDIRECT" in the last 72 hours. Thyroid  Function Tests: No results for input(s): "TSH", "T4TOTAL", "FREET4", "T3FREE", "THYROIDAB" in the last 72 hours. Anemia Panel: No results for input(s): "VITAMINB12", "FOLATE", "FERRITIN", "TIBC", "IRON", "RETICCTPCT" in the last 72 hours. Sepsis Labs: No results for input(s): "PROCALCITON", "LATICACIDVEN" in the last 168 hours.  No results found for this or any previous visit (from the past 240 hours).       Radiology Studies: CT CHEST WO CONTRAST Result Date: 08/28/2023 CLINICAL DATA:  Persistent cough. EXAM: CT CHEST WITHOUT CONTRAST TECHNIQUE: Multidetector CT imaging of the chest was performed following the standard protocol without IV contrast. RADIATION DOSE REDUCTION: This exam was performed according to the departmental dose-optimization program which includes automated exposure control, adjustment of the mA and/or kV according to patient size and/or use of iterative reconstruction technique. COMPARISON:  Chest radiographs dated 08/27/2023 and 07/26/2023. Chest CTA dated 09/05/2015. FINDINGS: Cardiovascular: Post CABG changes. Atheromatous calcifications, including the coronary arteries and aorta with marked, extensive coronary artery calcification. Borderline enlarged heart. No pericardial effusion. Mediastinum/Nodes: No enlarged mediastinal or axillary lymph nodes. Thyroid  gland, trachea, and esophagus demonstrate no significant findings. Lungs/Pleura: Lungs are clear. No pleural effusion or pneumothorax. Upper Abdomen: Interval small to moderate amount of free peritoneal fluid. No free peritoneal air. Atheromatous arterial calcifications. Musculoskeletal: Mild-to-moderate bilateral gynecomastia. Minimal thoracic spine degenerative changes. Lower  cervical spine degenerative changes and fixation hardware. IMPRESSION: 1. No acute abnormality. 2. Interval small to moderate amount of ascites. 3. Mild-to-moderate bilateral gynecomastia. 4. Aortic and coronary artery atherosclerosis. Aortic Atherosclerosis (ICD10-I70.0). Electronically Signed   By: Catherin Closs M.D.   On: 08/28/2023 14:23        Scheduled Meds:  amLODipine   10 mg Oral Daily   aspirin   81 mg Oral Daily   atorvastatin   80 mg Oral QHS   azelastine   2 spray Each Nare BID   brimonidine   1 drop Both Eyes Q12H   And   timolol   1 drop Both Eyes BID   dorzolamide   1 drop Both Eyes BID   ezetimibe   10 mg Oral QHS   fluticasone   2 spray Each Nare Daily   gabapentin   300 mg Oral BID   gentamicin  cream  1 Application Topical Daily   heparin  injection (subcutaneous)  5,000 Units Subcutaneous Q8H   insulin  aspart  0-15 Units Subcutaneous TID WC   insulin  aspart  0-5 Units Subcutaneous QHS   insulin  aspart  3 Units Subcutaneous TID WC   insulin  glargine-yfgn  32 Units Subcutaneous BID   insulin  NPH Human  60 Units Subcutaneous QHS   latanoprost   1 drop Both Eyes QHS   loratadine   10 mg Oral Daily   pantoprazole   40 mg Oral BID   senna  17.2 mg Oral Daily   sevelamer carbonate  800 mg Oral TID WC   torsemide   40 mg Oral BID   Continuous Infusions:  dialysis solution 4.25% low-MG/low-CA       LOS: 0 days    Time spent: 35 minutes    Thurman Sarver A Ameris Akamine, MD [facility]   If 7PM-7AM, please contact night-coverage [email or web address]  08/29/2023, 12:28 PM

## 2023-08-29 NOTE — Progress Notes (Signed)
 Orthopedic Tech Progress Note Patient Details:  Chase Clark Oct 31, 1965 [id number] A BK shrinker was delivered to bedside.  Patient ID: Chase Clark, male   DOB: 02/21/1966, 58 y.o.   MRN: [id number]  Chase Clark 08/29/2023, 11:11 AM

## 2023-08-29 NOTE — Plan of Care (Signed)
  Problem: Tissue Perfusion: Goal: Adequacy of tissue perfusion will improve Outcome: Progressing   Problem: Clinical Measurements: Goal: Will remain free from infection Outcome: Progressing Goal: Respiratory complications will improve Outcome: Progressing Goal: Cardiovascular complication will be avoided Outcome: Progressing   Problem: Nutrition: Goal: Adequate nutrition will be maintained Outcome: Progressing   Problem: Elimination: Goal: Will not experience complications related to bowel motility Outcome: Progressing Goal: Will not experience complications related to urinary retention Outcome: Progressing   Problem: Safety: Goal: Ability to remain free from injury will improve Outcome: Progressing

## 2023-08-29 NOTE — Procedures (Signed)
PD tx initation note:    PD treatment initiated via aseptic technique. Consent signed and in chart. Patient is alert and oriented. No complaints of pain. No specimen collected. PD exit site clean, dry and intact. Gentamycin and new dressing applied. Bedside RN educated on PD machine and how to contact tech support when PD machine alarms.

## 2023-08-29 NOTE — Progress Notes (Signed)
 [facility] Progress Note   Subjective:   Seen in room with family at bedside. Reports he is not short of breath but was coughing last night when laying flat. Still has some edema, especially in his L arm. Denies CP, dizziness, nausea.   Objective Vitals:   08/28/23 1912 08/29/23 0010 08/29/23 0507 08/29/23 0759  BP: (!) 141/79 128/70 133/83 129/70  Pulse: 78 68 75 80  Resp: 18 18 18 18   Temp: 98.4 F (36.9 C) 98.3 F (36.8 C) 97.9 F (36.6 C) 97.9 F (36.6 C)  TempSrc: Oral Oral Oral Oral  SpO2: 98% 96% 96% 97%  Weight:   117.9 kg   Height:       Physical Exam General: Alert male in NAD Heart: RRR, no murmurs, rubs or gallops Lungs: CTA bilaterally, respirations unlabored on RA Abdomen: Soft, non-distended, +BS Extremities: trace edema b/l lower extremities, 1+ edema LUE Dialysis Access:  PD cath in lower abdomen  Additional Objective Labs: Basic Metabolic Panel: Recent Labs  Lab 08/27/23 0552 08/27/23 2211 08/28/23 1334  NA 133* 131*  --   K 4.6 5.1  --   CL 97* 99  --   CO2 22 19*  --   GLUCOSE 115* 159*  --   BUN 42* 45*  --   CREATININE 8.24* 9.08*  --   CALCIUM  8.5* 8.3*  --   PHOS  --   --  7.2*   Liver Function Tests: Recent Labs  Lab 08/27/23 0552  AST 23  ALT 22  ALKPHOS 145*  BILITOT 0.3  PROT 6.2*  ALBUMIN  3.2*   Recent Labs  Lab 08/27/23 0552  LIPASE 54*   CBC: Recent Labs  Lab 08/27/23 0552  WBC 10.1  NEUTROABS 7.0  HGB 10.9*  HCT 33.2*  MCV 100.6*  PLT 176   Blood Culture    Component Value Date/Time   SDES BLOOD LEFT HAND 07/26/2023 1150   SPECREQUEST  07/26/2023 1150    BOTTLES DRAWN AEROBIC AND ANAEROBIC Blood Culture results may not be optimal due to an inadequate volume of blood received in culture bottles   CULT  07/26/2023 1150    NO GROWTH 5 DAYS Performed at [facility], [address] [place], [place] [address]    REPTSTATUS 07/31/2023 FINAL 07/26/2023 1150    Cardiac Enzymes: No  results for input(s): "CKTOTAL", "CKMB", "CKMBINDEX", "TROPONINI" in the last 168 hours. CBG: Recent Labs  Lab 08/28/23 0911 08/28/23 1139 08/28/23 1514 08/28/23 2052 08/29/23 0558  GLUCAP 165* 130* 165* 276* 387*   Iron Studies: No results for input(s): "IRON", "TIBC", "TRANSFERRIN", "FERRITIN" in the last 72 hours. @lablastinr3 @ Studies/Results: CT CHEST WO CONTRAST Result Date: 08/28/2023 CLINICAL DATA:  Persistent cough. EXAM: CT CHEST WITHOUT CONTRAST TECHNIQUE: Multidetector CT imaging of the chest was performed following the standard protocol without IV contrast. RADIATION DOSE REDUCTION: This exam was performed according to the departmental dose-optimization program which includes automated exposure control, adjustment of the mA and/or kV according to patient size and/or use of iterative reconstruction technique. COMPARISON:  Chest radiographs dated 08/27/2023 and 07/26/2023. Chest CTA dated 09/05/2015. FINDINGS: Cardiovascular: Post CABG changes. Atheromatous calcifications, including the coronary arteries and aorta with marked, extensive coronary artery calcification. Borderline enlarged heart. No pericardial effusion. Mediastinum/Nodes: No enlarged mediastinal or axillary lymph nodes. Thyroid  gland, trachea, and esophagus demonstrate no significant findings. Lungs/Pleura: Lungs are clear. No pleural effusion or pneumothorax. Upper Abdomen: Interval small to moderate amount of free peritoneal fluid. No  free peritoneal air. Atheromatous arterial calcifications. Musculoskeletal: Mild-to-moderate bilateral gynecomastia. Minimal thoracic spine degenerative changes. Lower cervical spine degenerative changes and fixation hardware. IMPRESSION: 1. No acute abnormality. 2. Interval small to moderate amount of ascites. 3. Mild-to-moderate bilateral gynecomastia. 4. Aortic and coronary artery atherosclerosis. Aortic Atherosclerosis (ICD10-I70.0). Electronically Signed   By: Catherin Closs M.D.   On:  08/28/2023 14:23   Medications:  dialysis solution 4.25% low-MG/low-CA      amLODipine   10 mg Oral Daily   aspirin   81 mg Oral Daily   atorvastatin   80 mg Oral QHS   azelastine   2 spray Each Nare BID   brimonidine   1 drop Both Eyes Q12H   And   timolol   1 drop Both Eyes BID   dorzolamide   1 drop Both Eyes BID   ezetimibe   10 mg Oral QHS   fluticasone   2 spray Each Nare Daily   gabapentin   300 mg Oral BID   gentamicin  cream  1 Application Topical Daily   heparin  injection (subcutaneous)  5,000 Units Subcutaneous Q8H   insulin  aspart  0-15 Units Subcutaneous TID WC   insulin  aspart  0-5 Units Subcutaneous QHS   insulin  aspart  3 Units Subcutaneous TID WC   insulin  glargine-yfgn  32 Units Subcutaneous BID   insulin  NPH Human  60 Units Subcutaneous QHS   latanoprost   1 drop Both Eyes QHS   loratadine   10 mg Oral Daily   pantoprazole   40 mg Oral BID   senna  17.2 mg Oral Daily   torsemide   40 mg Oral BID   PD rx: 5 cycles, 1.5 hr dwell time, 2.4L fill volume, no daytime dwell Uses heparin  1000 units/liter  Assessment/Plan: Cough/chest discomfort: Chest x-ray and CT scan of chest are negative, but does seem to be volume overloaded on exam. Continue UF with PD, further work up per admitting team ESRD: on PD 7 nights a week followed by Dr. Zelda Hickman in [place].  Plan PD nightly while here. HTN: Blood pressures improved post PD, continue home meds and get volume off with peritoneal dialysis Volume: Volume overloaded but improving. Maximal UF with peritoneal dialysis using 4.25% dextrose  fluids.  This will take a while typically with PD.  Agree w/ torsemide  as well at 40mg  bid for now, he is voiding.  Anemia of esrd: Hemoglobin 10-11 here, no ESA indicated at this time.  Secondary hyperparathyroidism: Corrected calcium  WNL, phos elevated, do not see a binder on home med list, will start renvela.      Ramona Burner, PA-C 08/29/2023, 10:05 AM  [facility] Pager: [phone]

## 2023-08-29 NOTE — Progress Notes (Signed)
   08/29/23 0900  Peritoneal Catheter Left lower abdomen Continuous ambulatory  Placement Date/Time: 08/30/20 5409   Procedural Verification: Site marked with initials  Time out: Correct Patient;Correct Site;Correct Procedure  Person Inserting LDA: Dr. Mauri Sous  Catheter Location: Left lower abdomen  Catheter Conversion: Extension...  Dressing Gauze/Drain sponge  Dressing Status Clean, Dry, Intact  Completion  Treatment Status Complete  Initial Drain Volume 1155  Average Dwell Time-Hour(s) 1  Average Dwell Time-Min(s) 30  Average Drain Time 36  Total Therapy Volume 81191  Total Therapy Time-Hour(s) 13  Total Therapy Time-Min(s) 2  Effluent Appearance Amber;Clear  Fluid Balance - CCPD  Total UF (+ value on cycler, pt loss) 3209 mL  Procedure Comments  Tolerated treatment well? Yes  Peritoneal Dialysis Comments Patient reports the machine did alarm at times during the night.  Also reported that he did note being a bit uncomfortable a couple times with the dwell stage with feeling full.

## 2023-08-30 ENCOUNTER — Observation Stay (HOSPITAL_COMMUNITY)

## 2023-08-30 DIAGNOSIS — I120 Hypertensive chronic kidney disease with stage 5 chronic kidney disease or end stage renal disease: Secondary | ICD-10-CM | POA: Diagnosis not present

## 2023-08-30 DIAGNOSIS — N186 End stage renal disease: Secondary | ICD-10-CM | POA: Diagnosis not present

## 2023-08-30 DIAGNOSIS — Z992 Dependence on renal dialysis: Secondary | ICD-10-CM | POA: Diagnosis not present

## 2023-08-30 DIAGNOSIS — N2581 Secondary hyperparathyroidism of renal origin: Secondary | ICD-10-CM | POA: Diagnosis not present

## 2023-08-30 DIAGNOSIS — M7989 Other specified soft tissue disorders: Secondary | ICD-10-CM

## 2023-08-30 DIAGNOSIS — E8779 Other fluid overload: Secondary | ICD-10-CM | POA: Diagnosis not present

## 2023-08-30 LAB — GLUCOSE, CAPILLARY
Glucose-Capillary: 358 mg/dL — ABNORMAL HIGH (ref 70–99)
Glucose-Capillary: 145 mg/dL — ABNORMAL HIGH (ref 70–99)

## 2023-08-30 MED ORDER — AZELASTINE HCL 0.1 % NA SOLN: 2.0000 | Freq: Two times a day (BID) | NASAL | 12 refills | Status: DC

## 2023-08-30 MED ORDER — SEVELAMER CARBONATE 800 MG PO TABS: 800.0000 mg | ORAL_TABLET | Freq: Three times a day (TID) | ORAL | 0 refills | Status: DC

## 2023-08-30 MED ORDER — LORATADINE 10 MG PO TABS: 10.0000 mg | ORAL_TABLET | Freq: Every day | ORAL | 0 refills | Status: DC

## 2023-08-30 MED ORDER — AMLODIPINE BESYLATE 10 MG PO TABS: 5.0000 mg | ORAL_TABLET | Freq: Every day | ORAL | 2 refills | Status: DC

## 2023-08-30 MED ORDER — PANTOPRAZOLE SODIUM 40 MG PO TBEC: 40.0000 mg | DELAYED_RELEASE_TABLET | Freq: Two times a day (BID) | ORAL | 0 refills | Status: DC

## 2023-08-30 MED ORDER — GUAIFENESIN ER 600 MG PO TB12: 600.0000 mg | ORAL_TABLET | Freq: Two times a day (BID) | ORAL | 0 refills | Status: DC | PRN

## 2023-08-30 NOTE — Progress Notes (Addendum)
 VASCULAR LAB    Left upper extremity venous duplex has been performed.  See CV proc for preliminary results.  Relayed negative results to Loetta Ringer, RN and Dr. Abe Abed, [facility], RVT 08/30/2023, 12:47 PM

## 2023-08-30 NOTE — Evaluation (Signed)
 Physical Therapy Brief Evaluation and Discharge Note Patient Details Name: Chase Clark MRN: [id number] DOB: 1966/04/14 Today's Date: 08/30/2023   History of Present Illness  This 58yo male was referred by [place], PA with Left Transtibiail Amputaiton on 12/18/2017. Patient underwent a left Transtibial Amputation on 09/05/2017. He underweant surgery with Cervical fusion C5-6.  LTTA, C5-6 fusion sg, DM, neuropathy, CKD, CAD, arthritis, HTN  Clinical Impression  Pt is presenting below baseline level of functioning. Pt reports he only recently started using RW. Pt is at baseline from last hospitalization. Pt currently requires Min A for bed mobility, CGA for sit to stand and short distance gait. Pt was able to have LLE prosthetic donned with good fit. Spouse is able to assist at home. Due to pt current functional status, home set up and available assistance at home recommending skilled physical therapy services 3x/week in order to address strength, balance and functional mobility to decrease risk for falls, injury and re-hospitalization.          PT Assessment All further PT needs can be met in the next venue of care  Assistance Needed at Discharge  PRN    Equipment Recommendations None recommended by PT     Precautions/Restrictions Precautions Precautions: Fall Recall of Precautions/Restrictions: Intact Restrictions Weight Bearing Restrictions Per Provider Order: No        Mobility  Bed Mobility   Supine/Sidelying to sit: Min assist   General bed mobility comments: pt in recliner at end of session. Spouse states she can and has been assisting at home.  Transfers Overall transfer level: Needs assistance Equipment used: Rolling walker (2 wheels) Transfers: Sit to/from Stand, Bed to chair/wheelchair/BSC Sit to Stand: Contact guard assist   Step pivot transfers: Contact guard assist       General transfer comment: CGA for safety     Ambulation/Gait Ambulation/Gait assistance: Contact guard assist Gait Distance (Feet): 30 Feet Assistive device: Rolling walker (2 wheels) Gait Pattern/deviations: Step-through pattern, Decreased weight shift to left, Decreased stride length Gait Speed: Below normal General Gait Details: CGA for safety with use of RW. Verbal cues due to visual impairment.  Home Activity Instructions Home Activity Instructions: Keep moving.     Balance Overall balance assessment: Needs assistance, Mild deficits observed, not formally tested Sitting-balance support: No upper extremity supported, Feet supported Sitting balance-Leahy Scale: Good     Standing balance support: Bilateral upper extremity supported, During functional activity, Reliant on assistive device for balance, Single extremity supported Standing balance-Leahy Scale: Fair Standing balance comment: Pt has impaired balance with sway and does require UE support with dynamic movements.          Pertinent Vitals/Pain   Pain Assessment Pain Assessment: Faces Faces Pain Scale: Hurts little more Pain Location: back Pain Descriptors / Indicators: Aching Pain Intervention(s): Monitored during session     Home Living Family/patient expects to be discharged to:: Private residence Living Arrangements: Spouse/significant other Available Help at Discharge: Family;Available 24 hours/day Home Environment: Level entry   Home Equipment: Patent examiner (4 wheels);Cane - single point;Grab bars - tub/shower;Hand held shower head;Wheelchair - Sport and exercise psychologist Comments: uses two chairs to get into tub, one out and one in    Prior Function Level of Independence: Independent with assistive device(s) Comments: spouse helps with set up for showers and washing back    UE/LE Assessment   UE ROM/Strength/Tone/Coordination: WFL    LE ROM/Strength/Tone/Coordination: Generalized weakness      Communication    Communication  Communication: No apparent difficulties     Cognition Overall Cognitive Status: Appears within functional limits for tasks assessed/performed       General Comments General comments (skin integrity, edema, etc.): Spouse present during session. Supportive.        Assessment/Plan    PT Problem List Decreased balance;Decreased safety awareness       PT Visit Diagnosis Unsteadiness on feet (R26.81);Other abnormalities of gait and mobility (R26.89);Muscle weakness (generalized) (M62.81)      AMPAC 6 Clicks Help needed turning from your back to your side while in a flat bed without using bedrails?: A Little Help needed moving from lying on your back to sitting on the side of a flat bed without using bedrails?: A Little Help needed moving to and from a bed to a chair (including a wheelchair)?: A Little Help needed standing up from a chair using your arms (e.g., wheelchair or bedside chair)?: A Little Help needed to walk in hospital room?: A Little Help needed climbing 3-5 steps with a railing? : A Lot 6 Click Score: 17      End of Session Equipment Utilized During Treatment: Gait belt;Other (comment) (L prosthetic leg) Activity Tolerance: Patient tolerated treatment well Patient left: in chair;with call bell/phone within reach;with family/visitor present Nurse Communication: Mobility status PT Visit Diagnosis: Unsteadiness on feet (R26.81);Other abnormalities of gait and mobility (R26.89);Muscle weakness (generalized) (M62.81)     Time: 3086-5784 PT Time Calculation (min) (ACUTE ONLY): 29 min  Charges:   PT Evaluation $PT Eval Low Complexity: 1 Low PT Treatments $Therapeutic Activity: 8-22 mins   Sloan Duncans, DPT, CLT  Acute Rehabilitation Services Office: [phone] (Secure chat preferred)   [name]  08/30/2023, 1:52 PM

## 2023-08-30 NOTE — Plan of Care (Signed)
  Problem: Coping: Goal: Ability to adjust to condition or change in health will improve Outcome: Progressing   Problem: Health Behavior/Discharge Planning: Goal: Ability to manage health-related needs will improve Outcome: Progressing   Problem: Skin Integrity: Goal: Risk for impaired skin integrity will decrease Outcome: Progressing   Problem: Tissue Perfusion: Goal: Adequacy of tissue perfusion will improve Outcome: Progressing   Problem: Health Behavior/Discharge Planning: Goal: Ability to manage health-related needs will improve Outcome: Progressing   Problem: Clinical Measurements: Goal: Will remain free from infection Outcome: Progressing Goal: Respiratory complications will improve Outcome: Progressing Goal: Cardiovascular complication will be avoided Outcome: Progressing   Problem: Coping: Goal: Level of anxiety will decrease Outcome: Progressing   Problem: Elimination: Goal: Will not experience complications related to bowel motility Outcome: Progressing Goal: Will not experience complications related to urinary retention Outcome: Progressing   Problem: Safety: Goal: Ability to remain free from injury will improve Outcome: Progressing

## 2023-08-30 NOTE — Discharge Summary (Signed)
 Physician Discharge Summary   Patient: Chase Clark MRN: [id number] DOB: 1965-10-27  Admit date:     08/27/2023  Discharge date: 08/30/23  Discharge Physician: Danette Duos   PCP: Jacklin Mascot, MD   Recommendations at discharge:    Further volume management with peritoneal Dialysis. .  Further follow up of cough by PCP Needs repeat Alk phosphatase.  Further adjustment of DM regimen,   Discharge Diagnoses: Principal Problem:   Chest pain  Resolved Problems:   * No resolved hospital problems. *  Hospital Course: 58 year old with past medical history significant for diabetes type 2 but recently converted into type I.  The endocrinologist notes, ESRD on peritoneal dialysis, hypertension, diabetic leg ulcer who presents with severe cough worse over the last months.  Also reports chest pain from coughing.    Assessment and Plan: 1-Cough, pleuritic chest pain: - Presents with worsening cough of the last month. - X-ray:No acute cardiopulmonary abnormality.  -EKG: sinus Rhythm CT chest order.  BNP.54 Will tx post nasal drip, allergies, reflux. PRN albuterol  ordered.  Cough improved.      Mild elevation troponin:  Ho CAD s/p CABG -flat in setting HD -demand ischemia mismatch.  -Cardiology was consulted by Admitter. If ECHO normal , no further evaluation per cardio   ESRD on peritoneal dialysis -Nephrology consulted.  Resume peritoneal Dialysis. Nephrologist adjusting fluids used at home.    Diabetes mellitus insulin -dependent -SSI Per wife patient uses NPH 70 at bedtime before dialysis. Also use Tresiba  42 units BID. ---resume 40 BID>  Resume Home regimen.   Hypomagnesemia:  Replaced IV    Left Arm edema;Doppler negative   Hyponatremia; in setting CKD HTN; Resume Norvasc . , resume half tablet, wife with concern for Hypotension   Elevation ALk Phosphatase.  GTT normal. Fu outpatient        Consultants: Nephrology  Disposition: Home Diet  recommendation:  Discharge Diet Orders (From admission, onward)     Start     Ordered   08/30/23 0000  Diet Carb Modified        08/30/23 1036           Carb modified diet DISCHARGE MEDICATION: Allergies as of 08/30/2023       Reactions   Baclofen Other (See Comments)   Disorientation, Contraindication with dialysis    Nsaids Other (See Comments)   CKD on dialysis    Statins Other (See Comments)   Muscle pain        Medication List     STOP taking these medications    esomeprazole  20 MG capsule Commonly known as: NEXIUM  Replaced by: pantoprazole  40 MG tablet       TAKE these medications    acetaminophen  325 MG tablet Commonly known as: TYLENOL  Take 1 tablet (325 mg total) by mouth every 6 (six) hours as needed for mild pain (pain score 1-3 or temp > 100.5).   amLODipine  10 MG tablet Commonly known as: NORVASC  Take 0.5 tablets (5 mg total) by mouth daily. What changed: how much to take   aspirin  81 MG chewable tablet Commonly known as: Aspirin  Childrens Chew 1 tablet (81 mg total) by mouth daily.   atorvastatin  80 MG tablet Commonly known as: LIPITOR  Take 1 tablet (80 mg total) by mouth at bedtime.   azelastine  0.1 % nasal spray Commonly known as: ASTELIN  Place 2 sprays into both nostrils 2 (two) times daily. Use in each nostril as directed   brimonidine -timolol  0.2-0.5 % ophthalmic solution Commonly known as:  COMBIGAN  Place 1 drop into both eyes in the morning and at bedtime.   Dexcom G7 Sensor Misc USE 1 DEVICE AS DIRECTED What changed: See the new instructions.   dorzolamide  2 % ophthalmic solution Commonly known as: TRUSOPT  Place 1 drop into both eyes 2 (two) times daily.   ezetimibe  10 MG tablet Commonly known as: ZETIA  Take 1 tablet (10 mg total) by mouth at bedtime.   Flomax  0.4 MG Caps capsule Generic drug: tamsulosin  Take 0.4 mg by mouth at bedtime.   fluticasone  50 MCG/ACT nasal spray Commonly known as: FLONASE  SPRAY 2 SPRAYS  INTO EACH NOSTRIL EVERY DAY What changed: See the new instructions.   gabapentin  300 MG capsule Commonly known as: NEURONTIN  Take 1 capsule (300 mg total) by mouth 2 (two) times daily.   gentamicin  cream 0.1 % Commonly known as: GARAMYCIN  Apply 1 application  topically See admin instructions. Apply around port site as directed   guaiFENesin  600 MG 12 hr tablet Commonly known as: MUCINEX  Take 1 tablet (600 mg total) by mouth 2 (two) times daily as needed for to loosen phlegm.   HumuLIN  N KwikPen 100 UNIT/ML KwikPen Generic drug: Insulin  NPH (Human) (Isophane) Inject 70 Units into the skin daily in the afternoon. To be taken at the start of dialysis What changed:  when to take this additional instructions   insulin  lispro 100 UNIT/ML KwikPen Commonly known as: HumaLOG  KwikPen Max daily 45 units What changed:  how much to take how to take this when to take this reasons to take this additional instructions   Klor-Con  M20 20 MEQ tablet Generic drug: potassium chloride  SA Take 20 mEq by mouth in the morning.   lactulose  10 GM/15ML solution Commonly known as: CHRONULAC  TAKE 30 MLS (20 G TOTAL) BY MOUTH AS NEEDED. What changed: See the new instructions.   latanoprost  0.005 % ophthalmic solution Commonly known as: XALATAN  Place 1 drop into both eyes in the morning.   loratadine  10 MG tablet Commonly known as: CLARITIN  Take 1 tablet (10 mg total) by mouth daily. Start taking on: Aug 31, 2023   metoCLOPramide  10 MG tablet Commonly known as: REGLAN  Take 1 tablet (10 mg total) by mouth at bedtime.   midodrine  5 MG tablet Commonly known as: PROAMATINE  Take 5 mg by mouth 2 (two) times daily as needed (if the Systolic number is less than 100).   mometasone  0.1 % cream Commonly known as: ELOCON  Apply 1 Application topically as directed. Qd to bid to aa right ankle, right lower leg until improved, then prn flares What changed:  when to take this additional instructions    MULTIVITAMIN ADULT PO Take 1 tablet by mouth daily.   ondansetron  4 MG disintegrating tablet Commonly known as: ZOFRAN -ODT Take 4 mg by mouth every 8 (eight) hours as needed for nausea or vomiting (dissolve orally).   pantoprazole  40 MG tablet Commonly known as: PROTONIX  Take 1 tablet (40 mg total) by mouth 2 (two) times daily. Replaces: esomeprazole  20 MG capsule   senna 8.6 MG tablet Commonly known as: SENOKOT Take 2 tablets by mouth in the morning.   sevelamer carbonate 800 MG tablet Commonly known as: RENVELA Take 1 tablet (800 mg total) by mouth 3 (three) times daily with meals.   Testosterone  20.25 MG/ACT (1.62%) Gel Place 2 Pump onto the skin daily in the afternoon. What changed: additional instructions   torsemide  20 MG tablet Commonly known as: DEMADEX  Take 40 mg by mouth 2 (two) times daily. At breakfast  and lunchtime   Tresiba  FlexTouch 200 UNIT/ML FlexTouch Pen Generic drug: insulin  degludec Inject 42 Units into the skin in the morning and at bedtime.   Vitamin D (Ergocalciferol) 1.25 MG (50000 UNIT) Caps capsule Commonly known as: DRISDOL Take 50,000 Units by mouth every 30 (thirty) days.   Vitamin D3 1000 units Caps Take 1,000 Units by mouth in the morning.               Discharge Care Instructions  (From admission, onward)           Start     Ordered   08/30/23 0000  Discharge wound care:       Comments: See above   08/30/23 1036            Follow-up Information     Bair, Kalpana, MD Follow up in 1 week(s).   Specialty: Family Medicine Contact information: [address] [place] [address] [phone]                Discharge Exam: Filed Weights   08/28/23 1800 08/29/23 0507 08/30/23 0345  Weight: 119 kg 117.9 kg 117 kg   General; NAD  Condition at discharge: stable  The results of significant diagnostics from this hospitalization (including imaging, microbiology, ancillary and laboratory) are listed  below for reference.   Imaging Studies: ECHOCARDIOGRAM COMPLETE Result Date: 08/29/2023    ECHOCARDIOGRAM REPORT   Patient Name:   DEIONTE SPIVACK Date of Exam: 08/29/2023 Medical Rec #:  [id number]       Height:       70.0 in Accession #:    [id number]      Weight:       259.9 lb Date of Birth:  [birth year]       BSA:          2.333 m Patient Age:    58 years        BP:           99/62 mmHg Patient Gender: M               HR:           67 bpm. Exam Location:  Inpatient Procedure: 2D Echo, Cardiac Doppler and Color Doppler (Both Spectral and Color            Flow Doppler were utilized during procedure). Indications:    CHF  History:        Patient has prior history of Echocardiogram examinations, most                 recent 08/11/2020. Risk Factors:Hypertension and Diabetes.  Sonographer:    Janette Medley Referring Phys: Sanjuanita Cruz IMPRESSIONS  1. Left ventricular ejection fraction, by estimation, is 60 to 65%. The left ventricle has normal function. The left ventricle has no regional wall motion abnormalities. There is mild left ventricular hypertrophy. Left ventricular diastolic parameters are consistent with Grade I diastolic dysfunction (impaired relaxation).  2. Right ventricular systolic function is normal. The right ventricular size is normal.  3. The mitral valve is degenerative. No evidence of mitral valve regurgitation. No evidence of mitral stenosis. Moderate mitral annular calcification.  4. The aortic valve is calcified. There is moderate calcification of the aortic valve. There is moderate thickening of the aortic valve. Aortic valve regurgitation is not visualized. Aortic valve sclerosis is present, with no evidence of aortic valve stenosis.  5. The inferior vena cava is normal in size with greater than  50% respiratory variability, suggesting right atrial pressure of 3 mmHg. FINDINGS  Left Ventricle: Left ventricular ejection fraction, by estimation, is 60 to 65%. The left ventricle has normal  function. The left ventricle has no regional wall motion abnormalities. The left ventricular internal cavity size was normal in size. There is  mild left ventricular hypertrophy. Left ventricular diastolic parameters are consistent with Grade I diastolic dysfunction (impaired relaxation). Right Ventricle: The right ventricular size is normal. No increase in right ventricular wall thickness. Right ventricular systolic function is normal. Left Atrium: Left atrial size was normal in size. Right Atrium: Right atrial size was normal in size. Pericardium: There is no evidence of pericardial effusion. Mitral Valve: The mitral valve is degenerative in appearance. There is mild thickening of the mitral valve leaflet(s). Moderate mitral annular calcification. No evidence of mitral valve regurgitation. No evidence of mitral valve stenosis. Tricuspid Valve: The tricuspid valve is normal in structure. Tricuspid valve regurgitation is not demonstrated. No evidence of tricuspid stenosis. Aortic Valve: The aortic valve is calcified. There is moderate calcification of the aortic valve. There is moderate thickening of the aortic valve. Aortic valve regurgitation is not visualized. Aortic valve sclerosis is present, with no evidence of aortic valve stenosis. Pulmonic Valve: The pulmonic valve was normal in structure. Pulmonic valve regurgitation is trivial. No evidence of pulmonic stenosis. Aorta: The aortic root is normal in size and structure. Venous: The inferior vena cava is normal in size with greater than 50% respiratory variability, suggesting right atrial pressure of 3 mmHg. IAS/Shunts: No atrial level shunt detected by color flow Doppler.  LEFT VENTRICLE PLAX 2D LVIDd:         4.60 cm   Diastology LVIDs:         3.20 cm   LV e' medial:    6.09 cm/s LV PW:         1.00 cm   LV E/e' medial:  18.1 LV IVS:        1.20 cm   LV e' lateral:   5.66 cm/s LVOT diam:     2.00 cm   LV E/e' lateral: 19.4 LV SV:         68 LV SV Index:    29 LVOT Area:     3.14 cm  RIGHT VENTRICLE            IVC RV S prime:     9.25 cm/s  IVC diam: 1.30 cm TAPSE (M-mode): 1.8 cm LEFT ATRIUM             Index        RIGHT ATRIUM           Index LA diam:        3.80 cm 1.63 cm/m   RA Area:     15.50 cm LA Vol (A2C):   54.3 ml 23.27 ml/m  RA Volume:   38.00 ml  16.29 ml/m LA Vol (A4C):   54.6 ml 23.40 ml/m LA Biplane Vol: 55.7 ml 23.87 ml/m  AORTIC VALVE LVOT Vmax:   99.60 cm/s LVOT Vmean:  65.500 cm/s LVOT VTI:    0.218 m  AORTA Ao Root diam: 3.20 cm Ao Asc diam:  3.50 cm MITRAL VALVE MV Area (PHT): 3.65 cm     SHUNTS MV Decel Time: 208 msec     Systemic VTI:  0.22 m MV E velocity: 110.00 cm/s  Systemic Diam: 2.00 cm MV A velocity: 90.10 cm/s MV E/A ratio:  1.22 [employer]  MD Electronically signed by Dorothye Gathers MD Signature Date/Time: 08/29/2023/3:49:26 PM    Final    CT CHEST WO CONTRAST Result Date: 08/28/2023 CLINICAL DATA:  Persistent cough. EXAM: CT CHEST WITHOUT CONTRAST TECHNIQUE: Multidetector CT imaging of the chest was performed following the standard protocol without IV contrast. RADIATION DOSE REDUCTION: This exam was performed according to the departmental dose-optimization program which includes automated exposure control, adjustment of the mA and/or kV according to patient size and/or use of iterative reconstruction technique. COMPARISON:  Chest radiographs dated 08/27/2023 and 07/26/2023. Chest CTA dated 09/05/2015. FINDINGS: Cardiovascular: Post CABG changes. Atheromatous calcifications, including the coronary arteries and aorta with marked, extensive coronary artery calcification. Borderline enlarged heart. No pericardial effusion. Mediastinum/Nodes: No enlarged mediastinal or axillary lymph nodes. Thyroid  gland, trachea, and esophagus demonstrate no significant findings. Lungs/Pleura: Lungs are clear. No pleural effusion or pneumothorax. Upper Abdomen: Interval small to moderate amount of free peritoneal fluid. No free peritoneal air.  Atheromatous arterial calcifications. Musculoskeletal: Mild-to-moderate bilateral gynecomastia. Minimal thoracic spine degenerative changes. Lower cervical spine degenerative changes and fixation hardware. IMPRESSION: 1. No acute abnormality. 2. Interval small to moderate amount of ascites. 3. Mild-to-moderate bilateral gynecomastia. 4. Aortic and coronary artery atherosclerosis. Aortic Atherosclerosis (ICD10-I70.0). Electronically Signed   By: Catherin Closs M.D.   On: 08/28/2023 14:23   DG Chest 2 View Result Date: 08/27/2023 CLINICAL DATA:  58 year old male with shortness of breath, cough, congestion, chest tightness. EXAM: CHEST - 2 VIEW COMPARISON:  Portable chest 07/26/2023 and earlier. FINDINGS: Portable AP semi upright and lateral views of the chest at 0639 hours. Chronic CABG. Heart size and mediastinal contours remain normal. Visualized tracheal air column is within normal limits. Chronic cervical ACDF. Lung volumes are stable since 2020. Both lungs appear clear. No pneumothorax or pleural effusion. Osteopenia. No acute osseous abnormality identified. Negative visible bowel gas. IMPRESSION: No acute cardiopulmonary abnormality. Electronically Signed   By: Marlise Simpers M.D.   On: 08/27/2023 06:52    Microbiology: Results for orders placed or performed during the hospital encounter of 07/26/23  Blood Culture (routine x 2)     Status: None   Collection Time: 07/26/23 11:45 AM   Specimen: BLOOD RIGHT HAND  Result Value Ref Range Status   Specimen Description BLOOD RIGHT HAND  Final   Special Requests   Final    BOTTLES DRAWN AEROBIC AND ANAEROBIC Blood Culture results may not be optimal due to an inadequate volume of blood received in culture bottles   Culture   Final    NO GROWTH 5 DAYS Performed at [facility], [address], [place], [place] [address]    Report Status 07/31/2023 FINAL  Final  Blood Culture (routine x 2)     Status: None   Collection Time: 07/26/23 11:50 AM   Specimen:  BLOOD LEFT HAND  Result Value Ref Range Status   Specimen Description BLOOD LEFT HAND  Final   Special Requests   Final    BOTTLES DRAWN AEROBIC AND ANAEROBIC Blood Culture results may not be optimal due to an inadequate volume of blood received in culture bottles   Culture   Final    NO GROWTH 5 DAYS Performed at [facility], [address] [place], [place] [address]    Report Status 07/31/2023 FINAL  Final   *Note: Due to a large number of results and/or encounters for the requested time period, some results have not been displayed. A complete set of results can be found  in Results Review.    Labs: CBC: Recent Labs  Lab 08/27/23 0552  WBC 10.1  NEUTROABS 7.0  HGB 10.9*  HCT 33.2*  MCV 100.6*  PLT 176   Basic Metabolic Panel: Recent Labs  Lab 08/27/23 0552 08/27/23 2211 08/28/23 1334  NA 133* 131*  --   K 4.6 5.1  --   CL 97* 99  --   CO2 22 19*  --   GLUCOSE 115* 159*  --   BUN 42* 45*  --   CREATININE 8.24* 9.08*  --   CALCIUM  8.5* 8.3*  --   PHOS  --   --  7.2*   Liver Function Tests: Recent Labs  Lab 08/27/23 0552  AST 23  ALT 22  ALKPHOS 145*  BILITOT 0.3  PROT 6.2*  ALBUMIN  3.2*   CBG: Recent Labs  Lab 08/29/23 0558 08/29/23 1048 08/29/23 1555 08/29/23 2108 08/30/23 0549  GLUCAP 387* 169* 161* 306* 358*    Discharge time spent: greater than 30 minutes.  Signed: Danette Duos, MD [facility] 08/30/2023

## 2023-08-30 NOTE — Progress Notes (Signed)
 [facility] Progress Note   Subjective:   Patient seen in room. Swelling improving. Reports he did have some cramping in his ribs and legs. Reports cough is improved. Denies SOB, CP, dizziness.   Objective Vitals:   08/29/23 1944 08/30/23 0001 08/30/23 0345 08/30/23 0755  BP: (!) 158/70 139/81 (!) 137/93 122/73  Pulse: 80 71 73 71  Resp:    18  Temp: 98.3 F (36.8 C) 98 F (36.7 C) 98 F (36.7 C) 98.2 F (36.8 C)  TempSrc: Oral Oral Oral Oral  SpO2: 98% 96% 98% 96%  Weight:   117 kg   Height:       Physical Exam General: Alert male in NAD Heart: RRR, no murmurs, rubs or gallops  Lungs: CTA bilaterally, respirations unlabored on RA Abdomen: Soft, non-distended, +BS Extremities: Trace edema b/l lower extremities and LUE Dialysis Access:  PD cath in lower abdomen  Additional Objective Labs: Basic Metabolic Panel: Recent Labs  Lab 08/27/23 0552 08/27/23 2211 08/28/23 1334  NA 133* 131*  --   K 4.6 5.1  --   CL 97* 99  --   CO2 22 19*  --   GLUCOSE 115* 159*  --   BUN 42* 45*  --   CREATININE 8.24* 9.08*  --   CALCIUM  8.5* 8.3*  --   PHOS  --   --  7.2*   Liver Function Tests: Recent Labs  Lab 08/27/23 0552  AST 23  ALT 22  ALKPHOS 145*  BILITOT 0.3  PROT 6.2*  ALBUMIN  3.2*   Recent Labs  Lab 08/27/23 0552  LIPASE 54*   CBC: Recent Labs  Lab 08/27/23 0552  WBC 10.1  NEUTROABS 7.0  HGB 10.9*  HCT 33.2*  MCV 100.6*  PLT 176   Blood Culture    Component Value Date/Time   SDES BLOOD LEFT HAND 07/26/2023 1150   SPECREQUEST  07/26/2023 1150    BOTTLES DRAWN AEROBIC AND ANAEROBIC Blood Culture results may not be optimal due to an inadequate volume of blood received in culture bottles   CULT  07/26/2023 1150    NO GROWTH 5 DAYS Performed at [facility], [address] [place], [place] [address]    REPTSTATUS 07/31/2023 FINAL 07/26/2023 1150    Cardiac Enzymes: No results for input(s): "CKTOTAL", "CKMB", "CKMBINDEX",  "TROPONINI" in the last 168 hours. CBG: Recent Labs  Lab 08/29/23 0558 08/29/23 1048 08/29/23 1555 08/29/23 2108 08/30/23 0549  GLUCAP 387* 169* 161* 306* 358*   Iron Studies: No results for input(s): "IRON", "TIBC", "TRANSFERRIN", "FERRITIN" in the last 72 hours. @lablastinr3 @ Studies/Results: ECHOCARDIOGRAM COMPLETE Result Date: 08/29/2023    ECHOCARDIOGRAM REPORT   Patient Name:   Chase Clark Date of Exam: 08/29/2023 Medical Rec #:  [id number]       Height:       70.0 in Accession #:    [id number]      Weight:       259.9 lb Date of Birth:  1965/10/07       BSA:          2.333 m Patient Age:    58 years        BP:           99/62 mmHg Patient Gender: M               HR:           67 bpm. Exam Location:  Inpatient Procedure: 2D Echo, Cardiac  Doppler and Color Doppler (Both Spectral and Color            Flow Doppler were utilized during procedure). Indications:    CHF  History:        Patient has prior history of Echocardiogram examinations, most                 recent 08/11/2020. Risk Factors:Hypertension and Diabetes.  Sonographer:    Janette Medley Referring Phys: Sanjuanita Cruz IMPRESSIONS  1. Left ventricular ejection fraction, by estimation, is 60 to 65%. The left ventricle has normal function. The left ventricle has no regional wall motion abnormalities. There is mild left ventricular hypertrophy. Left ventricular diastolic parameters are consistent with Grade I diastolic dysfunction (impaired relaxation).  2. Right ventricular systolic function is normal. The right ventricular size is normal.  3. The mitral valve is degenerative. No evidence of mitral valve regurgitation. No evidence of mitral stenosis. Moderate mitral annular calcification.  4. The aortic valve is calcified. There is moderate calcification of the aortic valve. There is moderate thickening of the aortic valve. Aortic valve regurgitation is not visualized. Aortic valve sclerosis is present, with no evidence of aortic valve  stenosis.  5. The inferior vena cava is normal in size with greater than 50% respiratory variability, suggesting right atrial pressure of 3 mmHg. FINDINGS  Left Ventricle: Left ventricular ejection fraction, by estimation, is 60 to 65%. The left ventricle has normal function. The left ventricle has no regional wall motion abnormalities. The left ventricular internal cavity size was normal in size. There is  mild left ventricular hypertrophy. Left ventricular diastolic parameters are consistent with Grade I diastolic dysfunction (impaired relaxation). Right Ventricle: The right ventricular size is normal. No increase in right ventricular wall thickness. Right ventricular systolic function is normal. Left Atrium: Left atrial size was normal in size. Right Atrium: Right atrial size was normal in size. Pericardium: There is no evidence of pericardial effusion. Mitral Valve: The mitral valve is degenerative in appearance. There is mild thickening of the mitral valve leaflet(s). Moderate mitral annular calcification. No evidence of mitral valve regurgitation. No evidence of mitral valve stenosis. Tricuspid Valve: The tricuspid valve is normal in structure. Tricuspid valve regurgitation is not demonstrated. No evidence of tricuspid stenosis. Aortic Valve: The aortic valve is calcified. There is moderate calcification of the aortic valve. There is moderate thickening of the aortic valve. Aortic valve regurgitation is not visualized. Aortic valve sclerosis is present, with no evidence of aortic valve stenosis. Pulmonic Valve: The pulmonic valve was normal in structure. Pulmonic valve regurgitation is trivial. No evidence of pulmonic stenosis. Aorta: The aortic root is normal in size and structure. Venous: The inferior vena cava is normal in size with greater than 50% respiratory variability, suggesting right atrial pressure of 3 mmHg. IAS/Shunts: No atrial level shunt detected by color flow Doppler.  LEFT VENTRICLE PLAX 2D  LVIDd:         4.60 cm   Diastology LVIDs:         3.20 cm   LV e' medial:    6.09 cm/s LV PW:         1.00 cm   LV E/e' medial:  18.1 LV IVS:        1.20 cm   LV e' lateral:   5.66 cm/s LVOT diam:     2.00 cm   LV E/e' lateral: 19.4 LV SV:         68 LV SV Index:  29 LVOT Area:     3.14 cm  RIGHT VENTRICLE            IVC RV S prime:     9.25 cm/s  IVC diam: 1.30 cm TAPSE (M-mode): 1.8 cm LEFT ATRIUM             Index        RIGHT ATRIUM           Index LA diam:        3.80 cm 1.63 cm/m   RA Area:     15.50 cm LA Vol (A2C):   54.3 ml 23.27 ml/m  RA Volume:   38.00 ml  16.29 ml/m LA Vol (A4C):   54.6 ml 23.40 ml/m LA Biplane Vol: 55.7 ml 23.87 ml/m  AORTIC VALVE LVOT Vmax:   99.60 cm/s LVOT Vmean:  65.500 cm/s LVOT VTI:    0.218 m  AORTA Ao Root diam: 3.20 cm Ao Asc diam:  3.50 cm MITRAL VALVE MV Area (PHT): 3.65 cm     SHUNTS MV Decel Time: 208 msec     Systemic VTI:  0.22 m MV E velocity: 110.00 cm/s  Systemic Diam: 2.00 cm MV A velocity: 90.10 cm/s MV E/A ratio:  1.22 Dorothye Gathers MD Electronically signed by Dorothye Gathers MD Signature Date/Time: 08/29/2023/3:49:26 PM    Final    Medications:  dialysis solution 4.25% low-MG/low-CA      amLODipine   10 mg Oral Daily   aspirin   81 mg Oral Daily   atorvastatin   80 mg Oral QHS   azelastine   2 spray Each Nare BID   brimonidine   1 drop Both Eyes Q12H   And   timolol   1 drop Both Eyes BID   dorzolamide   1 drop Both Eyes BID   ezetimibe   10 mg Oral QHS   fluticasone   2 spray Each Nare Daily   gabapentin   300 mg Oral BID   gentamicin  cream  1 Application Topical Daily   heparin  injection (subcutaneous)  5,000 Units Subcutaneous Q8H   insulin  aspart  0-15 Units Subcutaneous TID WC   insulin  aspart  0-5 Units Subcutaneous QHS   insulin  aspart  3 Units Subcutaneous TID WC   insulin  glargine-yfgn  32 Units Subcutaneous BID   insulin  NPH Human  60 Units Subcutaneous QHS   latanoprost   1 drop Both Eyes QHS   loratadine   10 mg Oral Daily   pantoprazole    40 mg Oral BID   senna  17.2 mg Oral Daily   sevelamer carbonate  800 mg Oral TID WC   torsemide   40 mg Oral BID    Dialysis Orders:  5 cycles, 1.5 hr dwell time, 2.4L fill volume, no daytime dwell Uses heparin  1000 units/liter  Assessment/Plan: Cough/chest discomfort: Chest x-ray and CT scan of chest are negative, but did seem to be volume overloaded on exam. Continue UF with PD, further work up per admitting team ESRD: on PD 7 nights a week followed by Dr. Zelda Hickman in [place].  Plan PD nightly HTN: Blood pressures improved post PD, continue home meds and get volume off with peritoneal dialysis Volume: Volume overloaded on admission. Used all 4.25% exchanges with significant improvement, had 3.2L UF overnight with some cramping. Ok to use all 2.5% dextrose  tonight at home since he does not have 4.25% bags. 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  Secondary hyperparathyroidism:  Corrected calcium  WNL, phos elevated, do not see a binder on home med list, will start renvela.   Ramona Burner, PA-C 08/30/2023, 10:28 AM  [facility] Pager: [phone]

## 2023-08-31 ENCOUNTER — Ambulatory Visit

## 2023-08-31 DIAGNOSIS — N186 End stage renal disease: Secondary | ICD-10-CM | POA: Diagnosis not present

## 2023-08-31 DIAGNOSIS — Z992 Dependence on renal dialysis: Secondary | ICD-10-CM | POA: Diagnosis not present

## 2023-09-01 ENCOUNTER — Encounter (INDEPENDENT_AMBULATORY_CARE_PROVIDER_SITE_OTHER): Payer: Self-pay

## 2023-09-01 DIAGNOSIS — Z992 Dependence on renal dialysis: Secondary | ICD-10-CM | POA: Diagnosis not present

## 2023-09-01 DIAGNOSIS — N186 End stage renal disease: Secondary | ICD-10-CM | POA: Diagnosis not present

## 2023-09-02 DIAGNOSIS — N186 End stage renal disease: Secondary | ICD-10-CM | POA: Diagnosis not present

## 2023-09-02 DIAGNOSIS — Z992 Dependence on renal dialysis: Secondary | ICD-10-CM | POA: Diagnosis not present

## 2023-09-03 DIAGNOSIS — Z992 Dependence on renal dialysis: Secondary | ICD-10-CM | POA: Diagnosis not present

## 2023-09-03 DIAGNOSIS — N186 End stage renal disease: Secondary | ICD-10-CM | POA: Diagnosis not present

## 2023-09-04 DIAGNOSIS — N186 End stage renal disease: Secondary | ICD-10-CM | POA: Diagnosis not present

## 2023-09-04 DIAGNOSIS — Z992 Dependence on renal dialysis: Secondary | ICD-10-CM | POA: Diagnosis not present

## 2023-09-05 DIAGNOSIS — N186 End stage renal disease: Secondary | ICD-10-CM | POA: Diagnosis not present

## 2023-09-05 DIAGNOSIS — Z992 Dependence on renal dialysis: Secondary | ICD-10-CM | POA: Diagnosis not present

## 2023-09-06 ENCOUNTER — Other Ambulatory Visit: Payer: Self-pay | Admitting: Internal Medicine

## 2023-09-06 DIAGNOSIS — N186 End stage renal disease: Secondary | ICD-10-CM | POA: Diagnosis not present

## 2023-09-06 DIAGNOSIS — Z992 Dependence on renal dialysis: Secondary | ICD-10-CM | POA: Diagnosis not present

## 2023-09-07 DIAGNOSIS — N186 End stage renal disease: Secondary | ICD-10-CM | POA: Diagnosis not present

## 2023-09-07 DIAGNOSIS — Z992 Dependence on renal dialysis: Secondary | ICD-10-CM | POA: Diagnosis not present

## 2023-09-08 ENCOUNTER — Telehealth: Payer: Self-pay

## 2023-09-08 DIAGNOSIS — Z992 Dependence on renal dialysis: Secondary | ICD-10-CM | POA: Diagnosis not present

## 2023-09-08 DIAGNOSIS — D631 Anemia in chronic kidney disease: Secondary | ICD-10-CM | POA: Diagnosis not present

## 2023-09-08 DIAGNOSIS — E1022 Type 1 diabetes mellitus with diabetic chronic kidney disease: Secondary | ICD-10-CM | POA: Diagnosis not present

## 2023-09-08 DIAGNOSIS — N186 End stage renal disease: Secondary | ICD-10-CM | POA: Diagnosis not present

## 2023-09-08 DIAGNOSIS — G9341 Metabolic encephalopathy: Secondary | ICD-10-CM | POA: Diagnosis not present

## 2023-09-08 DIAGNOSIS — H548 Legal blindness, as defined in USA: Secondary | ICD-10-CM | POA: Diagnosis not present

## 2023-09-08 DIAGNOSIS — R29898 Other symptoms and signs involving the musculoskeletal system: Secondary | ICD-10-CM

## 2023-09-08 DIAGNOSIS — D509 Iron deficiency anemia, unspecified: Secondary | ICD-10-CM | POA: Diagnosis not present

## 2023-09-08 NOTE — Telephone Encounter (Signed)
 Copied from CRM [id number]. Topic: Referral - Request for Referral >> Sep 08, 2023  1:41 PM Magdalene School wrote: Did the patient discuss referral with their provider in the last year? Yes (If No - schedule appointment) (If Yes - send message)  Appointment offered? No  Type of order/referral and detailed reason for visit: Outpatient Physical Therapy  Preference of office, provider, location:  [facility] [address], [place], [place] [address] Phone: [phone]  If referral order, have you been seen by this specialty before? Yes (If Yes, this issue or another issue? When? Where? Patient has been getting physical therapy from [facility] and today 09/08/23 is the last session and patient will be discharged. Patient is requesting outpatient physical therapy.  Can we respond through MyChart? No

## 2023-09-09 DIAGNOSIS — Z992 Dependence on renal dialysis: Secondary | ICD-10-CM | POA: Diagnosis not present

## 2023-09-09 DIAGNOSIS — N186 End stage renal disease: Secondary | ICD-10-CM | POA: Diagnosis not present

## 2023-09-09 NOTE — Addendum Note (Signed)
 Addended by: Thersia Flax on: 09/09/2023 01:17 PM   Modules accepted: Orders

## 2023-09-09 NOTE — Telephone Encounter (Signed)
 Called and spoke to pt wife who is on DPR and she stated that he seen them last year but he will need a new referral placed. She stated that he need to build up his strength in arm to use walker

## 2023-09-10 DIAGNOSIS — Z992 Dependence on renal dialysis: Secondary | ICD-10-CM | POA: Diagnosis not present

## 2023-09-10 DIAGNOSIS — N186 End stage renal disease: Secondary | ICD-10-CM | POA: Diagnosis not present

## 2023-09-11 DIAGNOSIS — Z992 Dependence on renal dialysis: Secondary | ICD-10-CM | POA: Diagnosis not present

## 2023-09-11 DIAGNOSIS — N186 End stage renal disease: Secondary | ICD-10-CM | POA: Diagnosis not present

## 2023-09-12 DIAGNOSIS — Z992 Dependence on renal dialysis: Secondary | ICD-10-CM | POA: Diagnosis not present

## 2023-09-12 DIAGNOSIS — N186 End stage renal disease: Secondary | ICD-10-CM | POA: Diagnosis not present

## 2023-09-13 DIAGNOSIS — N186 End stage renal disease: Secondary | ICD-10-CM | POA: Diagnosis not present

## 2023-09-13 DIAGNOSIS — Z992 Dependence on renal dialysis: Secondary | ICD-10-CM | POA: Diagnosis not present

## 2023-09-14 DIAGNOSIS — Z992 Dependence on renal dialysis: Secondary | ICD-10-CM | POA: Diagnosis not present

## 2023-09-14 DIAGNOSIS — N186 End stage renal disease: Secondary | ICD-10-CM | POA: Diagnosis not present

## 2023-09-15 DIAGNOSIS — Z992 Dependence on renal dialysis: Secondary | ICD-10-CM | POA: Diagnosis not present

## 2023-09-15 DIAGNOSIS — N186 End stage renal disease: Secondary | ICD-10-CM | POA: Diagnosis not present

## 2023-09-16 DIAGNOSIS — Z992 Dependence on renal dialysis: Secondary | ICD-10-CM | POA: Diagnosis not present

## 2023-09-16 DIAGNOSIS — N186 End stage renal disease: Secondary | ICD-10-CM | POA: Diagnosis not present

## 2023-09-17 DIAGNOSIS — Z992 Dependence on renal dialysis: Secondary | ICD-10-CM | POA: Diagnosis not present

## 2023-09-17 DIAGNOSIS — N186 End stage renal disease: Secondary | ICD-10-CM | POA: Diagnosis not present

## 2023-09-18 DIAGNOSIS — Z992 Dependence on renal dialysis: Secondary | ICD-10-CM | POA: Diagnosis not present

## 2023-09-18 DIAGNOSIS — N186 End stage renal disease: Secondary | ICD-10-CM | POA: Diagnosis not present

## 2023-09-19 DIAGNOSIS — Z992 Dependence on renal dialysis: Secondary | ICD-10-CM | POA: Diagnosis not present

## 2023-09-19 DIAGNOSIS — N186 End stage renal disease: Secondary | ICD-10-CM | POA: Diagnosis not present

## 2023-09-21 DIAGNOSIS — N186 End stage renal disease: Secondary | ICD-10-CM | POA: Diagnosis not present

## 2023-09-21 DIAGNOSIS — Z992 Dependence on renal dialysis: Secondary | ICD-10-CM | POA: Diagnosis not present

## 2023-09-22 DIAGNOSIS — Z992 Dependence on renal dialysis: Secondary | ICD-10-CM | POA: Diagnosis not present

## 2023-09-22 DIAGNOSIS — N186 End stage renal disease: Secondary | ICD-10-CM | POA: Diagnosis not present

## 2023-09-23 DIAGNOSIS — N186 End stage renal disease: Secondary | ICD-10-CM | POA: Diagnosis not present

## 2023-09-23 DIAGNOSIS — Z992 Dependence on renal dialysis: Secondary | ICD-10-CM | POA: Diagnosis not present

## 2023-09-24 DIAGNOSIS — N186 End stage renal disease: Secondary | ICD-10-CM | POA: Diagnosis not present

## 2023-09-24 DIAGNOSIS — Z992 Dependence on renal dialysis: Secondary | ICD-10-CM | POA: Diagnosis not present

## 2023-09-25 DIAGNOSIS — N186 End stage renal disease: Secondary | ICD-10-CM | POA: Diagnosis not present

## 2023-09-25 DIAGNOSIS — Z992 Dependence on renal dialysis: Secondary | ICD-10-CM | POA: Diagnosis not present

## 2023-09-26 ENCOUNTER — Other Ambulatory Visit: Payer: Self-pay | Admitting: Internal Medicine

## 2023-09-26 DIAGNOSIS — Z992 Dependence on renal dialysis: Secondary | ICD-10-CM | POA: Diagnosis not present

## 2023-09-26 DIAGNOSIS — N186 End stage renal disease: Secondary | ICD-10-CM | POA: Diagnosis not present

## 2023-09-27 DIAGNOSIS — N186 End stage renal disease: Secondary | ICD-10-CM | POA: Diagnosis not present

## 2023-09-27 DIAGNOSIS — Z992 Dependence on renal dialysis: Secondary | ICD-10-CM | POA: Diagnosis not present

## 2023-09-28 ENCOUNTER — Other Ambulatory Visit: Payer: Self-pay | Admitting: Nurse Practitioner

## 2023-09-28 DIAGNOSIS — N186 End stage renal disease: Secondary | ICD-10-CM | POA: Diagnosis not present

## 2023-09-28 DIAGNOSIS — Z992 Dependence on renal dialysis: Secondary | ICD-10-CM | POA: Diagnosis not present

## 2023-09-28 NOTE — Telephone Encounter (Unsigned)
 Copied from CRM [id number]. Topic: Clinical - Medication Refill >> Sep 28, 2023  9:15 AM Lizabeth Riggs wrote: Medication: loratadine  (CLARITIN ) 10 MG tablet AND pantoprazole  (PROTONIX ) 40 MG tablet  Has the patient contacted their pharmacy? Yes (Agent: If no, request that the patient contact the pharmacy for the refill. If patient does not wish to contact the pharmacy document the reason why and proceed with request.) (Agent: If yes, when and what did the pharmacy advise?)  This is the patient's preferred pharmacy:  [facility] [phone] [facility] [place] - [address] [address] Phone: [phone] Fax: [phone]  Is this the correct pharmacy for this prescription? Yes If no, delete pharmacy and type the correct one.   Has the prescription been filled recently? No  Is the patient out of the medication? No  Has the patient been seen for an appointment in the last year OR does the patient have an upcoming appointment? Yes  Can we respond through MyChart? Yes  Agent: Please be advised that Rx refills may take up to 3 business days. We ask that you follow-up with your pharmacy.

## 2023-09-29 DIAGNOSIS — Z992 Dependence on renal dialysis: Secondary | ICD-10-CM | POA: Diagnosis not present

## 2023-09-29 DIAGNOSIS — N186 End stage renal disease: Secondary | ICD-10-CM | POA: Diagnosis not present

## 2023-09-30 DIAGNOSIS — Z992 Dependence on renal dialysis: Secondary | ICD-10-CM | POA: Diagnosis not present

## 2023-09-30 DIAGNOSIS — N186 End stage renal disease: Secondary | ICD-10-CM | POA: Diagnosis not present

## 2023-10-01 DIAGNOSIS — Z992 Dependence on renal dialysis: Secondary | ICD-10-CM | POA: Diagnosis not present

## 2023-10-01 DIAGNOSIS — N186 End stage renal disease: Secondary | ICD-10-CM | POA: Diagnosis not present

## 2023-10-02 DIAGNOSIS — N186 End stage renal disease: Secondary | ICD-10-CM | POA: Diagnosis not present

## 2023-10-02 DIAGNOSIS — Z992 Dependence on renal dialysis: Secondary | ICD-10-CM | POA: Diagnosis not present

## 2023-10-03 DIAGNOSIS — Z992 Dependence on renal dialysis: Secondary | ICD-10-CM | POA: Diagnosis not present

## 2023-10-03 DIAGNOSIS — N186 End stage renal disease: Secondary | ICD-10-CM | POA: Diagnosis not present

## 2023-10-04 DIAGNOSIS — Z992 Dependence on renal dialysis: Secondary | ICD-10-CM | POA: Diagnosis not present

## 2023-10-04 DIAGNOSIS — N186 End stage renal disease: Secondary | ICD-10-CM | POA: Diagnosis not present

## 2023-10-05 ENCOUNTER — Other Ambulatory Visit: Payer: Self-pay

## 2023-10-05 DIAGNOSIS — N186 End stage renal disease: Secondary | ICD-10-CM | POA: Diagnosis not present

## 2023-10-05 DIAGNOSIS — Z992 Dependence on renal dialysis: Secondary | ICD-10-CM | POA: Diagnosis not present

## 2023-10-05 NOTE — Telephone Encounter (Unsigned)
 Copied from CRM [id number]. Topic: Clinical - Medication Refill >> Oct 05, 2023 11:58 AM Franky GRADE wrote: Medication: pantoprazole  (PROTONIX ) 40 MG tablet [[id number]]], loratadine  (CLARITIN ) 10 MG tablet [[id number]]  Has the patient contacted their pharmacy? Yes, asked to contact primary care provider (Agent: If no, request that the patient contact the pharmacy for the refill. If patient does not wish to contact the pharmacy document the reason why and proceed with request.) (Agent: If yes, when and what did the pharmacy advise?)  This is the patient's preferred pharmacy:  [phone] [place] - [address] [place] [address] Phone: [phone] Fax: [phone]  Is this the correct pharmacy for this prescription? Yes If no, delete pharmacy and type the correct one.   Has the prescription been filled recently? No  Is the patient out of the medication? Yes  Has the patient been seen for an appointment in the last year OR does the patient have an upcoming appointment? Yes  Can we respond through MyChart? Yes  Agent: Please be advised that Rx refills may take up to 3 business days. We ask that you follow-up with your pharmacy.

## 2023-10-06 ENCOUNTER — Other Ambulatory Visit: Payer: Self-pay | Admitting: Internal Medicine

## 2023-10-06 DIAGNOSIS — Z992 Dependence on renal dialysis: Secondary | ICD-10-CM | POA: Diagnosis not present

## 2023-10-06 DIAGNOSIS — E1022 Type 1 diabetes mellitus with diabetic chronic kidney disease: Secondary | ICD-10-CM | POA: Diagnosis not present

## 2023-10-06 DIAGNOSIS — H548 Legal blindness, as defined in USA: Secondary | ICD-10-CM | POA: Diagnosis not present

## 2023-10-06 DIAGNOSIS — Z89512 Acquired absence of left leg below knee: Secondary | ICD-10-CM | POA: Diagnosis not present

## 2023-10-06 DIAGNOSIS — E1043 Type 1 diabetes mellitus with diabetic autonomic (poly)neuropathy: Secondary | ICD-10-CM | POA: Diagnosis not present

## 2023-10-06 DIAGNOSIS — N186 End stage renal disease: Secondary | ICD-10-CM | POA: Diagnosis not present

## 2023-10-06 DIAGNOSIS — D631 Anemia in chronic kidney disease: Secondary | ICD-10-CM | POA: Diagnosis not present

## 2023-10-06 DIAGNOSIS — K760 Fatty (change of) liver, not elsewhere classified: Secondary | ICD-10-CM | POA: Diagnosis not present

## 2023-10-06 DIAGNOSIS — E1051 Type 1 diabetes mellitus with diabetic peripheral angiopathy without gangrene: Secondary | ICD-10-CM | POA: Diagnosis not present

## 2023-10-06 DIAGNOSIS — G9341 Metabolic encephalopathy: Secondary | ICD-10-CM | POA: Diagnosis not present

## 2023-10-06 DIAGNOSIS — E103593 Type 1 diabetes mellitus with proliferative diabetic retinopathy without macular edema, bilateral: Secondary | ICD-10-CM | POA: Diagnosis not present

## 2023-10-06 DIAGNOSIS — I2510 Atherosclerotic heart disease of native coronary artery without angina pectoris: Secondary | ICD-10-CM | POA: Diagnosis not present

## 2023-10-06 DIAGNOSIS — D509 Iron deficiency anemia, unspecified: Secondary | ICD-10-CM | POA: Diagnosis not present

## 2023-10-07 ENCOUNTER — Encounter: Payer: Self-pay | Admitting: Nurse Practitioner

## 2023-10-07 ENCOUNTER — Other Ambulatory Visit: Payer: Self-pay

## 2023-10-07 DIAGNOSIS — N186 End stage renal disease: Secondary | ICD-10-CM | POA: Diagnosis not present

## 2023-10-07 DIAGNOSIS — Z992 Dependence on renal dialysis: Secondary | ICD-10-CM | POA: Diagnosis not present

## 2023-10-08 DIAGNOSIS — N186 End stage renal disease: Secondary | ICD-10-CM | POA: Diagnosis not present

## 2023-10-08 DIAGNOSIS — Z992 Dependence on renal dialysis: Secondary | ICD-10-CM | POA: Diagnosis not present

## 2023-10-08 MED ORDER — PANTOPRAZOLE SODIUM 40 MG PO TBEC: 40.0000 mg | DELAYED_RELEASE_TABLET | Freq: Two times a day (BID) | ORAL | 2 refills | Status: DC

## 2023-10-08 MED ORDER — LORATADINE 10 MG PO TABS: 10.0000 mg | ORAL_TABLET | Freq: Every day | ORAL | 0 refills | Status: DC

## 2023-10-09 ENCOUNTER — Other Ambulatory Visit: Payer: Self-pay | Admitting: Internal Medicine

## 2023-10-09 ENCOUNTER — Other Ambulatory Visit: Payer: Self-pay | Admitting: Nurse Practitioner

## 2023-10-09 DIAGNOSIS — N186 End stage renal disease: Secondary | ICD-10-CM | POA: Diagnosis not present

## 2023-10-09 DIAGNOSIS — Z992 Dependence on renal dialysis: Secondary | ICD-10-CM | POA: Diagnosis not present

## 2023-10-10 DIAGNOSIS — N186 End stage renal disease: Secondary | ICD-10-CM | POA: Diagnosis not present

## 2023-10-10 DIAGNOSIS — Z992 Dependence on renal dialysis: Secondary | ICD-10-CM | POA: Diagnosis not present

## 2023-10-11 DIAGNOSIS — N186 End stage renal disease: Secondary | ICD-10-CM | POA: Diagnosis not present

## 2023-10-11 DIAGNOSIS — Z992 Dependence on renal dialysis: Secondary | ICD-10-CM | POA: Diagnosis not present

## 2023-10-12 DIAGNOSIS — Z992 Dependence on renal dialysis: Secondary | ICD-10-CM | POA: Diagnosis not present

## 2023-10-12 DIAGNOSIS — N186 End stage renal disease: Secondary | ICD-10-CM | POA: Diagnosis not present

## 2023-10-13 ENCOUNTER — Encounter: Payer: Medicare Other | Admitting: Dermatology

## 2023-10-13 DIAGNOSIS — Z992 Dependence on renal dialysis: Secondary | ICD-10-CM | POA: Diagnosis not present

## 2023-10-13 DIAGNOSIS — N186 End stage renal disease: Secondary | ICD-10-CM | POA: Diagnosis not present

## 2023-10-14 DIAGNOSIS — Z992 Dependence on renal dialysis: Secondary | ICD-10-CM | POA: Diagnosis not present

## 2023-10-14 DIAGNOSIS — Z79899 Other long term (current) drug therapy: Secondary | ICD-10-CM | POA: Diagnosis not present

## 2023-10-14 DIAGNOSIS — Z794 Long term (current) use of insulin: Secondary | ICD-10-CM | POA: Diagnosis not present

## 2023-10-14 DIAGNOSIS — E119 Type 2 diabetes mellitus without complications: Secondary | ICD-10-CM | POA: Diagnosis not present

## 2023-10-14 DIAGNOSIS — N186 End stage renal disease: Secondary | ICD-10-CM | POA: Diagnosis not present

## 2023-10-14 DIAGNOSIS — Z5181 Encounter for therapeutic drug level monitoring: Secondary | ICD-10-CM | POA: Diagnosis not present

## 2023-10-15 DIAGNOSIS — N186 End stage renal disease: Secondary | ICD-10-CM | POA: Diagnosis not present

## 2023-10-15 DIAGNOSIS — Z992 Dependence on renal dialysis: Secondary | ICD-10-CM | POA: Diagnosis not present

## 2023-10-16 DIAGNOSIS — N186 End stage renal disease: Secondary | ICD-10-CM | POA: Diagnosis not present

## 2023-10-16 DIAGNOSIS — Z992 Dependence on renal dialysis: Secondary | ICD-10-CM | POA: Diagnosis not present

## 2023-10-17 DIAGNOSIS — N186 End stage renal disease: Secondary | ICD-10-CM | POA: Diagnosis not present

## 2023-10-17 DIAGNOSIS — Z992 Dependence on renal dialysis: Secondary | ICD-10-CM | POA: Diagnosis not present

## 2023-10-18 DIAGNOSIS — N186 End stage renal disease: Secondary | ICD-10-CM | POA: Diagnosis not present

## 2023-10-18 DIAGNOSIS — Z992 Dependence on renal dialysis: Secondary | ICD-10-CM | POA: Diagnosis not present

## 2023-10-19 DIAGNOSIS — R296 Repeated falls: Secondary | ICD-10-CM | POA: Diagnosis not present

## 2023-10-19 DIAGNOSIS — R278 Other lack of coordination: Secondary | ICD-10-CM | POA: Diagnosis not present

## 2023-10-19 DIAGNOSIS — N186 End stage renal disease: Secondary | ICD-10-CM | POA: Diagnosis not present

## 2023-10-19 DIAGNOSIS — R262 Difficulty in walking, not elsewhere classified: Secondary | ICD-10-CM | POA: Diagnosis not present

## 2023-10-19 DIAGNOSIS — Z992 Dependence on renal dialysis: Secondary | ICD-10-CM | POA: Diagnosis not present

## 2023-10-20 DIAGNOSIS — Z992 Dependence on renal dialysis: Secondary | ICD-10-CM | POA: Diagnosis not present

## 2023-10-20 DIAGNOSIS — N186 End stage renal disease: Secondary | ICD-10-CM | POA: Diagnosis not present

## 2023-10-21 ENCOUNTER — Telehealth: Payer: Self-pay | Admitting: Internal Medicine

## 2023-10-21 DIAGNOSIS — N186 End stage renal disease: Secondary | ICD-10-CM | POA: Diagnosis not present

## 2023-10-21 DIAGNOSIS — R2681 Unsteadiness on feet: Secondary | ICD-10-CM

## 2023-10-21 DIAGNOSIS — Z992 Dependence on renal dialysis: Secondary | ICD-10-CM | POA: Diagnosis not present

## 2023-10-21 NOTE — Addendum Note (Signed)
 Addended by: HARRIETTE RAISIN on: 10/21/2023 11:37 AM   Modules accepted: Orders

## 2023-10-21 NOTE — Telephone Encounter (Signed)
 FYI: Dr Marylynn put physical therapy orders in on 08/14/23 to [facility].    Copied from CRM [id number]. Topic: Referral - Question >> Oct 21, 2023 10:12 AM Robinson H wrote: Reason for CRM: [facility] calling to have referral sent to them for physical therapy so the can get him the gate, patient has decided to go there instead of home health.  [facility] [phone] fax

## 2023-10-21 NOTE — Telephone Encounter (Signed)
I have pended the referral for approval.  

## 2023-10-21 NOTE — Addendum Note (Signed)
 Addended by: MARYLYNN VERNEITA CROME on: 10/21/2023 01:00 PM   Modules accepted: Orders

## 2023-10-22 DIAGNOSIS — N186 End stage renal disease: Secondary | ICD-10-CM | POA: Diagnosis not present

## 2023-10-22 DIAGNOSIS — Z992 Dependence on renal dialysis: Secondary | ICD-10-CM | POA: Diagnosis not present

## 2023-10-23 DIAGNOSIS — R278 Other lack of coordination: Secondary | ICD-10-CM | POA: Diagnosis not present

## 2023-10-23 DIAGNOSIS — R262 Difficulty in walking, not elsewhere classified: Secondary | ICD-10-CM | POA: Diagnosis not present

## 2023-10-23 DIAGNOSIS — N186 End stage renal disease: Secondary | ICD-10-CM | POA: Diagnosis not present

## 2023-10-23 DIAGNOSIS — Z992 Dependence on renal dialysis: Secondary | ICD-10-CM | POA: Diagnosis not present

## 2023-10-23 DIAGNOSIS — R296 Repeated falls: Secondary | ICD-10-CM | POA: Diagnosis not present

## 2023-10-24 DIAGNOSIS — Z992 Dependence on renal dialysis: Secondary | ICD-10-CM | POA: Diagnosis not present

## 2023-10-24 DIAGNOSIS — N186 End stage renal disease: Secondary | ICD-10-CM | POA: Diagnosis not present

## 2023-10-25 DIAGNOSIS — Z992 Dependence on renal dialysis: Secondary | ICD-10-CM | POA: Diagnosis not present

## 2023-10-25 DIAGNOSIS — N186 End stage renal disease: Secondary | ICD-10-CM | POA: Diagnosis not present

## 2023-10-26 DIAGNOSIS — Z992 Dependence on renal dialysis: Secondary | ICD-10-CM | POA: Diagnosis not present

## 2023-10-26 DIAGNOSIS — N186 End stage renal disease: Secondary | ICD-10-CM | POA: Diagnosis not present

## 2023-10-27 DIAGNOSIS — Z992 Dependence on renal dialysis: Secondary | ICD-10-CM | POA: Diagnosis not present

## 2023-10-27 DIAGNOSIS — N186 End stage renal disease: Secondary | ICD-10-CM | POA: Diagnosis not present

## 2023-10-28 DIAGNOSIS — Z992 Dependence on renal dialysis: Secondary | ICD-10-CM | POA: Diagnosis not present

## 2023-10-28 DIAGNOSIS — N186 End stage renal disease: Secondary | ICD-10-CM | POA: Diagnosis not present

## 2023-10-29 DIAGNOSIS — Z992 Dependence on renal dialysis: Secondary | ICD-10-CM | POA: Diagnosis not present

## 2023-10-29 DIAGNOSIS — N186 End stage renal disease: Secondary | ICD-10-CM | POA: Diagnosis not present

## 2023-10-30 ENCOUNTER — Other Ambulatory Visit: Payer: Self-pay | Admitting: Nurse Practitioner

## 2023-10-30 DIAGNOSIS — N186 End stage renal disease: Secondary | ICD-10-CM | POA: Diagnosis not present

## 2023-10-30 DIAGNOSIS — Z992 Dependence on renal dialysis: Secondary | ICD-10-CM | POA: Diagnosis not present

## 2023-10-31 DIAGNOSIS — Z992 Dependence on renal dialysis: Secondary | ICD-10-CM | POA: Diagnosis not present

## 2023-10-31 DIAGNOSIS — N186 End stage renal disease: Secondary | ICD-10-CM | POA: Diagnosis not present

## 2023-11-01 DIAGNOSIS — Z992 Dependence on renal dialysis: Secondary | ICD-10-CM | POA: Diagnosis not present

## 2023-11-01 DIAGNOSIS — N186 End stage renal disease: Secondary | ICD-10-CM | POA: Diagnosis not present

## 2023-11-02 DIAGNOSIS — R278 Other lack of coordination: Secondary | ICD-10-CM | POA: Diagnosis not present

## 2023-11-02 DIAGNOSIS — R262 Difficulty in walking, not elsewhere classified: Secondary | ICD-10-CM | POA: Diagnosis not present

## 2023-11-02 DIAGNOSIS — R296 Repeated falls: Secondary | ICD-10-CM | POA: Diagnosis not present

## 2023-11-02 DIAGNOSIS — N186 End stage renal disease: Secondary | ICD-10-CM | POA: Diagnosis not present

## 2023-11-02 DIAGNOSIS — Z992 Dependence on renal dialysis: Secondary | ICD-10-CM | POA: Diagnosis not present

## 2023-11-03 DIAGNOSIS — Z992 Dependence on renal dialysis: Secondary | ICD-10-CM | POA: Diagnosis not present

## 2023-11-03 DIAGNOSIS — N186 End stage renal disease: Secondary | ICD-10-CM | POA: Diagnosis not present

## 2023-11-04 DIAGNOSIS — N186 End stage renal disease: Secondary | ICD-10-CM | POA: Diagnosis not present

## 2023-11-04 DIAGNOSIS — Z992 Dependence on renal dialysis: Secondary | ICD-10-CM | POA: Diagnosis not present

## 2023-11-05 DIAGNOSIS — N186 End stage renal disease: Secondary | ICD-10-CM | POA: Diagnosis not present

## 2023-11-05 DIAGNOSIS — Z992 Dependence on renal dialysis: Secondary | ICD-10-CM | POA: Diagnosis not present

## 2023-11-06 DIAGNOSIS — Z992 Dependence on renal dialysis: Secondary | ICD-10-CM | POA: Diagnosis not present

## 2023-11-06 DIAGNOSIS — N186 End stage renal disease: Secondary | ICD-10-CM | POA: Diagnosis not present

## 2023-11-07 DIAGNOSIS — Z992 Dependence on renal dialysis: Secondary | ICD-10-CM | POA: Diagnosis not present

## 2023-11-07 DIAGNOSIS — N186 End stage renal disease: Secondary | ICD-10-CM | POA: Diagnosis not present

## 2023-11-08 DIAGNOSIS — Z992 Dependence on renal dialysis: Secondary | ICD-10-CM | POA: Diagnosis not present

## 2023-11-08 DIAGNOSIS — N186 End stage renal disease: Secondary | ICD-10-CM | POA: Diagnosis not present

## 2023-11-09 DIAGNOSIS — N186 End stage renal disease: Secondary | ICD-10-CM | POA: Diagnosis not present

## 2023-11-09 DIAGNOSIS — Z992 Dependence on renal dialysis: Secondary | ICD-10-CM | POA: Diagnosis not present

## 2023-11-10 DIAGNOSIS — N186 End stage renal disease: Secondary | ICD-10-CM | POA: Diagnosis not present

## 2023-11-10 DIAGNOSIS — Z992 Dependence on renal dialysis: Secondary | ICD-10-CM | POA: Diagnosis not present

## 2023-11-11 DIAGNOSIS — N186 End stage renal disease: Secondary | ICD-10-CM | POA: Diagnosis not present

## 2023-11-11 DIAGNOSIS — Z992 Dependence on renal dialysis: Secondary | ICD-10-CM | POA: Diagnosis not present

## 2023-11-12 DIAGNOSIS — N186 End stage renal disease: Secondary | ICD-10-CM | POA: Diagnosis not present

## 2023-11-12 DIAGNOSIS — R278 Other lack of coordination: Secondary | ICD-10-CM | POA: Diagnosis not present

## 2023-11-12 DIAGNOSIS — R296 Repeated falls: Secondary | ICD-10-CM | POA: Diagnosis not present

## 2023-11-12 DIAGNOSIS — R262 Difficulty in walking, not elsewhere classified: Secondary | ICD-10-CM | POA: Diagnosis not present

## 2023-11-12 DIAGNOSIS — Z992 Dependence on renal dialysis: Secondary | ICD-10-CM | POA: Diagnosis not present

## 2023-11-13 ENCOUNTER — Ambulatory Visit: Payer: Medicare Other | Admitting: Internal Medicine

## 2023-11-13 DIAGNOSIS — Z992 Dependence on renal dialysis: Secondary | ICD-10-CM | POA: Diagnosis not present

## 2023-11-13 DIAGNOSIS — N186 End stage renal disease: Secondary | ICD-10-CM | POA: Diagnosis not present

## 2023-11-14 DIAGNOSIS — Z992 Dependence on renal dialysis: Secondary | ICD-10-CM | POA: Diagnosis not present

## 2023-11-14 DIAGNOSIS — N186 End stage renal disease: Secondary | ICD-10-CM | POA: Diagnosis not present

## 2023-11-15 DIAGNOSIS — Z992 Dependence on renal dialysis: Secondary | ICD-10-CM | POA: Diagnosis not present

## 2023-11-15 DIAGNOSIS — N186 End stage renal disease: Secondary | ICD-10-CM | POA: Diagnosis not present

## 2023-11-16 DIAGNOSIS — N186 End stage renal disease: Secondary | ICD-10-CM | POA: Diagnosis not present

## 2023-11-16 DIAGNOSIS — Z992 Dependence on renal dialysis: Secondary | ICD-10-CM | POA: Diagnosis not present

## 2023-11-17 DIAGNOSIS — N186 End stage renal disease: Secondary | ICD-10-CM | POA: Diagnosis not present

## 2023-11-17 DIAGNOSIS — Z992 Dependence on renal dialysis: Secondary | ICD-10-CM | POA: Diagnosis not present

## 2023-11-18 DIAGNOSIS — N186 End stage renal disease: Secondary | ICD-10-CM | POA: Diagnosis not present

## 2023-11-18 DIAGNOSIS — Z992 Dependence on renal dialysis: Secondary | ICD-10-CM | POA: Diagnosis not present

## 2023-11-19 ENCOUNTER — Ambulatory Visit (INDEPENDENT_AMBULATORY_CARE_PROVIDER_SITE_OTHER): Admitting: Dermatology

## 2023-11-19 DIAGNOSIS — Z86018 Personal history of other benign neoplasm: Secondary | ICD-10-CM

## 2023-11-19 DIAGNOSIS — D234 Other benign neoplasm of skin of scalp and neck: Secondary | ICD-10-CM

## 2023-11-19 DIAGNOSIS — W908XXA Exposure to other nonionizing radiation, initial encounter: Secondary | ICD-10-CM

## 2023-11-19 DIAGNOSIS — L281 Prurigo nodularis: Secondary | ICD-10-CM

## 2023-11-19 DIAGNOSIS — Z1283 Encounter for screening for malignant neoplasm of skin: Secondary | ICD-10-CM

## 2023-11-19 DIAGNOSIS — L720 Epidermal cyst: Secondary | ICD-10-CM

## 2023-11-19 DIAGNOSIS — L280 Lichen simplex chronicus: Secondary | ICD-10-CM | POA: Diagnosis not present

## 2023-11-19 DIAGNOSIS — D489 Neoplasm of uncertain behavior, unspecified: Secondary | ICD-10-CM | POA: Diagnosis not present

## 2023-11-19 DIAGNOSIS — D235 Other benign neoplasm of skin of trunk: Secondary | ICD-10-CM

## 2023-11-19 DIAGNOSIS — B351 Tinea unguium: Secondary | ICD-10-CM | POA: Diagnosis not present

## 2023-11-19 DIAGNOSIS — L729 Follicular cyst of the skin and subcutaneous tissue, unspecified: Secondary | ICD-10-CM

## 2023-11-19 DIAGNOSIS — D229 Melanocytic nevi, unspecified: Secondary | ICD-10-CM

## 2023-11-19 DIAGNOSIS — Z992 Dependence on renal dialysis: Secondary | ICD-10-CM | POA: Diagnosis not present

## 2023-11-19 DIAGNOSIS — L578 Other skin changes due to chronic exposure to nonionizing radiation: Secondary | ICD-10-CM | POA: Diagnosis not present

## 2023-11-19 DIAGNOSIS — D2361 Other benign neoplasm of skin of right upper limb, including shoulder: Secondary | ICD-10-CM

## 2023-11-19 DIAGNOSIS — N186 End stage renal disease: Secondary | ICD-10-CM | POA: Diagnosis not present

## 2023-11-19 MED ORDER — JUBLIA 10 % EX SOLN: CUTANEOUS | 6 refills | Status: DC

## 2023-11-19 MED ORDER — AMMONIUM LACTATE 12 % EX CREA: TOPICAL_CREAM | CUTANEOUS | 2 refills | Status: DC

## 2023-11-19 MED ORDER — CLOBETASOL PROPIONATE 0.05 % EX CREA: TOPICAL_CREAM | CUTANEOUS | 1 refills | Status: DC

## 2023-11-19 NOTE — Patient Instructions (Addendum)
 For toenail fungus  Can use ammonium lactate  cream to affected areas twice daily   Start terbinafine 250 mg tab - take 1 pill by mouth daily   Start jublia  solution - apply topically to affected toenails on right foot nightly for fungus    Terbinafine Counseling  Terbinafine is an anti-fungal medicine that can be applied to the skin (over the counter) or taken by mouth (prescription) to treat fungal infections. The pill version is often used to treat fungal infections of the nails or scalp. While most people do not have any side effects from taking terbinafine pills, some possible side effects of the medicine can include taste changes, headache, loss of smell, vision changes, nausea, vomiting, or diarrhea.   Rare side effects can include irritation of the liver, allergic reaction, or decrease in blood counts (which may show up as not feeling well or developing an infection). If you are concerned about any of these side effects, please stop the medicine and call your doctor, or in the case of an emergency such as feeling very unwell, seek immediate medical care.   For lichen simplex chronicus  Thickened plaques at legs and arms   Can use ammonium lactate  cream for thickened plaques at legs and arms twice daily   Start clobetasol  0.05 cream apply topically to red thick areas at legs twice daily for up to 4 weeks as needed.  Avoid applying to face, groin, and axilla. Use as directed. Long-term use can cause thinning of the skin.  Topical steroids (such as triamcinolone , fluocinolone, fluocinonide, mometasone , clobetasol , halobetasol, betamethasone, hydrocortisone) can cause thinning and lightening of the skin if they are used for too long in the same area. Your physician has selected the right strength medicine for your problem and area affected on the body. Please use your medication only as directed by your physician to prevent side effects.     Biopsy Wound Care Instructions  Leave the  original bandage on for 24 hours if possible.  If the bandage becomes soaked or soiled before that time, it is OK to remove it and examine the wound.  A small amount of post-operative bleeding is normal.  If excessive bleeding occurs, remove the bandage, place gauze over the site and apply continuous pressure (no peeking) over the area for 30 minutes. If this does not work, please call our clinic as soon as possible or page your doctor if it is after hours.   Once a day, cleanse the wound with soap and water. It is fine to shower. If a thick crust develops you may use a Q-tip dipped into dilute hydrogen peroxide (mix 1:1 with water) to dissolve it.  Hydrogen peroxide can slow the healing process, so use it only as needed.    After washing, apply petroleum jelly (Vaseline) or an antibiotic ointment if your doctor prescribed one for you, followed by a bandage.    For best healing, the wound should be covered with a layer of ointment at all times. If you are not able to keep the area covered with a bandage to hold the ointment in place, this may mean re-applying the ointment several times a day.  Continue this wound care until the wound has healed and is no longer open.   Itching and mild discomfort is normal during the healing process. However, if you develop pain or severe itching, please call our office.   If you have any discomfort, you can take Tylenol  (acetaminophen ) or ibuprofen  as directed on  the bottle. (Please do not take these if you have an allergy to them or cannot take them for another reason).  Some redness, tenderness and white or yellow material in the wound is normal healing.  If the area becomes very sore and red, or develops a thick yellow-green material (pus), it may be infected; please notify us .    If you have stitches, return to clinic as directed to have the stitches removed. You will continue wound care for 2-3 days after the stitches are removed.   Wound healing continues for  up to one year following surgery. It is not unusual to experience pain in the scar from time to time during the interval.  If the pain becomes severe or the scar thickens, you should notify the office.    A slight amount of redness in a scar is expected for the first six months.  After six months, the redness will fade and the scar will soften and fade.  The color difference becomes less noticeable with time.  If there are any problems, return for a post-op surgery check at your earliest convenience.  To improve the appearance of the scar, you can use silicone scar gel, cream, or sheets (such as Mederma or Serica) every night for up to one year. These are available over the counter (without a prescription).  Please call our office at [phone] for any questions or concerns.     Seborrheic Keratosis  What causes seborrheic keratoses? Seborrheic keratoses are harmless, common skin growths that first appear during adult life.  As time goes by, more growths appear.  Some people may develop a large number of them.  Seborrheic keratoses appear on both covered and uncovered body parts.  They are not caused by sunlight.  The tendency to develop seborrheic keratoses can be inherited.  They vary in color from skin-colored to gray, brown, or even black.  They can be either smooth or have a rough, warty surface.   Seborrheic keratoses are superficial and look as if they were stuck on the skin.  Under the microscope this type of keratosis looks like layers upon layers of skin.  That is why at times the top layer may seem to fall off, but the rest of the growth remains and re-grows.    Treatment Seborrheic keratoses do not need to be treated, but can easily be removed in the office.  Seborrheic keratoses often cause symptoms when they rub on clothing or jewelry.  Lesions can be in the way of shaving.  If they become inflamed, they can cause itching, soreness, or burning.  Removal of a seborrheic keratosis  can be accomplished by freezing, burning, or surgery. If any spot bleeds, scabs, or grows rapidly, please return to have it checked, as these can be an indication of a skin cancer.    Melanoma ABCDEs  Melanoma is the most dangerous type of skin cancer, and is the leading cause of death from skin disease.  You are more likely to develop melanoma if you: Have light-colored skin, light-colored eyes, or red or blond hair Spend a lot of time in the sun Tan regularly, either outdoors or in a tanning bed Have had blistering sunburns, especially during childhood Have a close family member who has had a melanoma Have atypical moles or large birthmarks  Early detection of melanoma is key since treatment is typically straightforward and cure rates are extremely high if we catch it early.   The first sign of melanoma is  often a change in a mole or a new dark spot.  The ABCDE system is a way of remembering the signs of melanoma.  A for asymmetry:  The two halves do not match. B for border:  The edges of the growth are irregular. C for color:  A mixture of colors are present instead of an even brown color. D for diameter:  Melanomas are usually (but not always) greater than 6mm - the size of a pencil eraser. E for evolution:  The spot keeps changing in size, shape, and color.  Please check your skin once per month between visits. You can use a small mirror in front and a large mirror behind you to keep an eye on the back side or your body.   If you see any new or changing lesions before your next follow-up, please call to schedule a visit.  Please continue daily skin protection including broad spectrum sunscreen SPF 30+ to sun-exposed areas, reapplying every 2 hours as needed when you're outdoors.   Staying in the shade or wearing long sleeves, sun glasses (UVA+UVB protection) and wide brim hats (4-inch brim around the entire circumference of the hat) are also recommended for sun protection.     Due to recent changes in healthcare laws, you may see results of your pathology and/or laboratory studies on MyChart before the doctors have had a chance to review them. We understand that in some cases there may be results that are confusing or concerning to you. Please understand that not all results are received at the same time and often the doctors may need to interpret multiple results in order to provide you with the best plan of care or course of treatment. Therefore, we ask that you please give us  2 business days to thoroughly review all your results before contacting the office for clarification. Should we see a critical lab result, you will be contacted sooner.   If You Need Anything After Your Visit  If you have any questions or concerns for your doctor, please call our main line at [phone] and press option 4 to reach your doctor's medical assistant. If no one answers, please leave a voicemail as directed and we will return your call as soon as possible. Messages left after 4 pm will be answered the following business day.   You may also send us  a message via MyChart. We typically respond to MyChart messages within 1-2 business days.  For prescription refills, please ask your pharmacy to contact our office. Our fax number is [phone].  If you have an urgent issue when the clinic is closed that cannot wait until the next business day, you can page your doctor at the number below.    Please note that while we do our best to be available for urgent issues outside of office hours, we are not available 24/7.   If you have an urgent issue and are unable to reach us , you may choose to seek medical care at your doctor's office, retail clinic, urgent care center, or emergency room.  If you have a medical emergency, please immediately call 911 or go to the emergency department.  Pager Numbers  - Dr. Hester: [phone]  - Dr. Jackquline: [phone]  - Dr. Claudene: [phone]    In the event of inclement weather, please call our main line at [phone] for an update on the status of any delays or closures.  Dermatology Medication Tips: Please keep the boxes that topical medications come in in  order to help keep track of the instructions about where and how to use these. Pharmacies typically print the medication instructions only on the boxes and not directly on the medication tubes.   If your medication is too expensive, please contact our office at [phone] option 4 or send us  a message through MyChart.   We are unable to tell what your co-pay for medications will be in advance as this is different depending on your insurance coverage. However, we may be able to find a substitute medication at lower cost or fill out paperwork to get insurance to cover a needed medication.   If a prior authorization is required to get your medication covered by your insurance company, please allow us  1-2 business days to complete this process.  Drug prices often vary depending on where the prescription is filled and some pharmacies may offer cheaper prices.  The website www.goodrx.com contains coupons for medications through different pharmacies. The prices here do not account for what the cost may be with help from insurance (it may be cheaper with your insurance), but the website can give you the price if you did not use any insurance.  - You can print the associated coupon and take it with your prescription to the pharmacy.  - You may also stop by our office during regular business hours and pick up a GoodRx coupon card.  - If you need your prescription sent electronically to a different pharmacy, notify our office through [facility] or by phone at [phone] option 4.     Si Usted Necesita Algo Despus de Su Visita  Tambin puede enviarnos un mensaje a travs de Clinical cytogeneticist. Por lo general respondemos a los mensajes de MyChart en el transcurso de 1 a 2 das  hbiles.  Para renovar recetas, por favor pida a su farmacia que se ponga en contacto con nuestra oficina. Randi lakes de fax es [place] [phone].  Si tiene un asunto urgente cuando la clnica est cerrada y que no puede esperar hasta el siguiente da hbil, puede llamar/localizar a su doctor(a) al nmero que aparece a continuacin.   Por favor, tenga en cuenta que aunque hacemos todo lo posible para estar disponibles para asuntos urgentes fuera del horario de [place], no estamos disponibles las 24 horas del da, los 7 [address]  [address] de la [place].   Si tiene un problema urgente y no puede comunicarse con nosotros, puede optar por buscar atencin mdica  en el consultorio de su doctor(a), en una clnica privada, en un centro de atencin urgente o en una sala de emergencias.  Si tiene Engineer, drilling, por favor llame inmediatamente al 911 o vaya a la sala de emergencias.  Nmeros de bper  - Dr. Hester: [phone]  - Dra. Jackquline: [phone]  - Dr. Claudene: [phone]   En caso de inclemencias del tiempo, por favor llame a landry capes principal al [phone] para una actualizacin sobre el [place] de cualquier retraso o cierre.  Consejos para la medicacin en dermatologa: Por favor, guarde las cajas en las que vienen los medicamentos de uso tpico para ayudarle a seguir las instrucciones sobre dnde y cmo usarlos. Las farmacias generalmente imprimen las instrucciones del medicamento slo en las cajas y no directamente en los tubos del [place].   Si su medicamento es muy caro, por favor, pngase en contacto con landry rieger llamando al [phone] y presione la opcin 4 o envenos un mensaje a travs de Clinical cytogeneticist.   No podemos decirle cul ser su  copago por los medicamentos por adelantado ya que esto es diferente dependiendo de la cobertura de su seguro. Sin embargo, es posible que podamos encontrar un medicamento sustituto a Audiological scientist un formulario para que el  seguro cubra el medicamento que se considera necesario.   Si se requiere una autorizacin previa para que su compaa de seguros malta su medicamento, por favor permtanos de 1 a 2 das hbiles para completar este proceso.  Los precios de los medicamentos varan con frecuencia dependiendo del Environmental consultant de dnde se surte la receta y alguna farmacias pueden ofrecer precios ms baratos.  El sitio web www.goodrx.com tiene cupones para medicamentos de Health and safety inspector. Los precios aqu no tienen en cuenta lo que podra costar con la ayuda del seguro (puede ser ms barato con su seguro), pero el sitio web puede darle el precio si no utiliz Tourist information centre manager.  - Puede imprimir el cupn correspondiente y llevarlo con su receta a la farmacia.  - Tambin puede pasar por nuestra oficina durante el horario de atencin regular y Education officer, museum una tarjeta de cupones de GoodRx.  - Si necesita que su receta se enve electrnicamente a una farmacia diferente, informe a nuestra oficina a travs de MyChart de [place] o por telfono llamando al [phone] y presione la opcin 4.

## 2023-11-19 NOTE — Progress Notes (Signed)
 Follow-Up Visit   Subjective  Chase Clark is a 58 y.o. male who presents for the following: Skin Cancer Screening and Full Body Skin Exam Hx of dysplastic nevus, hx of stasis dermatitis at right ankle reports mometasone  did not help, patient's wife reports toe fungus at right foot.   The patient presents for Total-Body Skin Exam (TBSE) for skin cancer screening and mole check. The patient has spots, moles and lesions to be evaluated, some may be new or changing and the patient may have concern these could be cancer.    The following portions of the chart were reviewed this encounter and updated as appropriate: medications, allergies, medical history  Review of Systems:  No other skin or systemic complaints except as noted in HPI or Assessment and Plan.  Objective  Well appearing patient in no apparent distress; mood and affect are within normal limits.  A full examination was performed including scalp, head, eyes, ears, nose, lips, neck, chest, axillae, abdomen, back, buttocks, bilateral upper extremities, bilateral lower extremities, hands, feet, fingers, toes, fingernails, and toenails. All findings within normal limits unless otherwise noted below.   Relevant physical exam findings are noted in the Assessment and Plan. Onychomycosis at toenails of right foot     Right Shoulder - Anterior 1.2 cm pink scaly patch   Assessment & Plan   SKIN CANCER SCREENING PERFORMED TODAY.  ACTINIC DAMAGE - Chronic condition, secondary to cumulative UV/sun exposure - diffuse scaly erythematous macules with underlying dyspigmentation - Recommend daily broad spectrum sunscreen SPF 30+ to sun-exposed areas, reapply every 2 hours as needed.  - Staying in the shade or wearing long sleeves, sun glasses (UVA+UVB protection) and wide brim hats (4-inch brim around the entire circumference of the hat) are also recommended for sun protection.  - Call for new or changing lesions.  LENTIGINES,  SEBORRHEIC KERATOSES, HEMANGIOMAS - Benign normal skin lesions - Benign-appearing - Call for any changes  Sks at hands  Can use ammonium lactate  cream to affected areas daily   MELANOCYTIC NEVI - 4 mm speckled brown macule at right medial posterior shoulder, Benign features under dermoscopy.   - 5 mm speckled brown macule at left spinal lower back nevus, Benign features under dermoscopy.   - Tan-brown and/or pink-flesh-colored symmetric macules and papules - Benign appearing on exam today - Observation - Call clinic for new or changing moles - Recommend daily use of broad spectrum spf 30+ sunscreen to sun-exposed areas.   NEUROFIBROMA Exam: soft flesh papule R medial shoulder   Treatment Plan: Benign-appearing.  Observation.  Call clinic for new or changing lesions.  Recommend daily use of broad spectrum spf 30+ sunscreen to sun-exposed areas.      EPIDERMAL INCLUSION CYST Exam: firm sub q nodule R lower flank   Benign-appearing. Exam most consistent with an epidermal inclusion cyst. Discussed that a cyst is a benign growth that can grow over time and sometimes get irritated or inflamed. Recommend observation if it is not bothersome. Discussed option of surgical excision to remove it if it is growing, symptomatic, or other changes noted. Please call for new or changing lesions so they can be evaluated.  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  Call if any new or changing lesions are noted between office visits   LICHEN SIMPLEX CHRONICUS/PRURIGO NODULARIS Exam: lichenified scaly plaque at left  forearm  And lichenified scaly plaque on right lateral lower leg   Chronic and persistent condition with duration or expected duration over one year. Condition is bothersome/symptomatic for patient. Currently flared.   Lichen simplex chronicus [facility]) is a persistent itchy area of thickened skin that is induced by chronic rubbing and/or scratching (chronic dermatitis).  These areas may be pink, hyperpigmented and may have excoriations and bumps (prurigo nodules- PN).  LSC/PN is commonly observed in uncontrolled atopic dermatitis and other forms of eczema, and in other itchy skin conditions (eg, insect bites, scabies).  Sometimes it is not possible to know initial cause of LSC/PN if it has been present for a long time.  It generally responds well to treatment with high potency topical steroids.  It is important to stop rubbing/scratching the area in order to break the itch-scratch-rash-itch cycle, in order for the rash to resolve.   Treatment Plan: Pt denies itching, but has peripheral neuropathy, does frequently lean on L arm  Start Ammonium lactate  12%  cream - apply to affected areas twice daily   Start Clobetasol  0.05 % cream - Apply topically to affected area of thickness at arms and legs qd/bid prn Avoid applying to face, groin, and axilla. Use as directed. Use up to 4 weeks, Caution skin atrophy with long-term use.   Topical steroids (such as triamcinolone , fluocinolone, fluocinonide, mometasone , clobetasol , halobetasol, betamethasone, hydrocortisone) can cause thinning and lightening of the skin if they are used for too long in the same area. Your physician has selected the right strength medicine for your problem and area affected on the body. Please use your medication only as directed by your physician to prevent side effects.    Avoid picking/rubbing/scratching  ONYCHOMYCOSIS Exam: 100 % involvement at right great toenail, 3rd toenail and 5th toenail right foot, with thickened toenails  with subungal debris c/w onychomycosis - see photos   Chronic and persistent condition with duration or expected duration over one year. Condition is bothersome/symptomatic for patient. Currently flared.   Treatment Plan: Reviewed recent labs from 08/27/2023 - AST and ALT - wnl  Start ammonium lactate  12 % cream - apply topically to affected toenails bid Start Terbinafine 250 mg tab - take 1 po qd.  Advised to call or send mychart after a month of taking medication, if no side effects will send an additional 2 refills.   Start Jublia  10 % solution - apply topically to affected toenails nightly   Terbinafine Counseling  Terbinafine is an anti-fungal medicine that can be applied to the skin (over the counter) or taken by mouth (prescription) to treat fungal infections. The pill version is often used to treat fungal infections of the nails or scalp. While most people do not have any side effects from taking terbinafine pills, some possible side effects of the medicine can include taste changes, headache, loss of smell, vision changes, nausea, vomiting, or diarrhea.   Rare side effects can include irritation of the liver, allergic reaction, or decrease in blood counts (which may show up as not feeling well or developing an infection). If you are concerned about any of these side effects, please stop the medicine and call your doctor, or in the case of an emergency such as feeling very unwell, seek immediate medical care.  ONYCHOMYCOSIS   Related Medications Efinaconazole  (JUBLIA ) 10 % SOLN Apply topically to affected toenails at night for onychomycosis ammonium lactate  (AMLACTIN) 12 % cream Apply topically to feet, toes and thickened areas of skin at arms and legs bid NEOPLASM OF UNCERTAIN BEHAVIOR Right Shoulder - Anterior Skin / nail biopsy Type of biopsy: tangential   Informed consent: discussed and consent obtained   Patient was prepped and draped in usual sterile fashion: Area prepped  with alcohol. Anesthesia: the lesion was anesthetized in a standard fashion   Anesthetic:  1% lidocaine  w/ epinephrine  1-100,000 buffered w/ 8.4% NaHCO3 Instrument used: flexible razor blade   Hemostasis achieved with: pressure, aluminum chloride and electrodesiccation   Outcome: patient tolerated procedure well   Post-procedure details: wound care instructions given   Post-procedure details comment:  Ointment and small bandage applied  Specimen 1 - Surgical pathology Differential Diagnosis: isk r/o bcc   Check Margins: No  isk r/o bcc   LICHEN SIMPLEX CHRONICUS   Related Medications ammonium lactate  (AMLACTIN) 12 % cream Apply topically to feet, toes and thickened areas of skin at arms and legs bid clobetasol  cream (TEMOVATE ) 0.05 % Apply topically to affected area of thickness at arms and legs qd/bid prn Avoid applying to face, groin, and axilla. Use as directed. Use up to 4 weeks SKIN CANCER SCREENING   ACTINIC SKIN DAMAGE   NEVUS   CYST OF SKIN   HISTORY OF DYSPLASTIC NEVUS   Return in about 1 year (around 11/18/2024) for TBSE.  I, Eleanor Blush, CMA, am acting as scribe for Rexene Rattler, MD.   Documentation: I have reviewed the above documentation for accuracy and completeness, and I agree with the above.  Rexene Rattler, MD

## 2023-11-20 ENCOUNTER — Ambulatory Visit: Admitting: Internal Medicine

## 2023-11-20 DIAGNOSIS — Z992 Dependence on renal dialysis: Secondary | ICD-10-CM | POA: Diagnosis not present

## 2023-11-20 DIAGNOSIS — N186 End stage renal disease: Secondary | ICD-10-CM | POA: Diagnosis not present

## 2023-11-21 DIAGNOSIS — N186 End stage renal disease: Secondary | ICD-10-CM | POA: Diagnosis not present

## 2023-11-21 DIAGNOSIS — Z992 Dependence on renal dialysis: Secondary | ICD-10-CM | POA: Diagnosis not present

## 2023-11-22 DIAGNOSIS — N186 End stage renal disease: Secondary | ICD-10-CM | POA: Diagnosis not present

## 2023-11-22 DIAGNOSIS — Z992 Dependence on renal dialysis: Secondary | ICD-10-CM | POA: Diagnosis not present

## 2023-11-23 ENCOUNTER — Other Ambulatory Visit: Payer: Self-pay

## 2023-11-23 DIAGNOSIS — Z992 Dependence on renal dialysis: Secondary | ICD-10-CM | POA: Diagnosis not present

## 2023-11-23 DIAGNOSIS — N186 End stage renal disease: Secondary | ICD-10-CM | POA: Diagnosis not present

## 2023-11-23 MED ORDER — TERBINAFINE HCL 250 MG PO TABS: ORAL_TABLET | ORAL | 0 refills | Status: DC

## 2023-11-23 NOTE — Telephone Encounter (Signed)
 Patient called the pharmacy never received his Terbinafine tablets.   Ok Terbinafine 250 mg #30 0 RF sent to [facility] [place]

## 2023-11-24 DIAGNOSIS — Z992 Dependence on renal dialysis: Secondary | ICD-10-CM | POA: Diagnosis not present

## 2023-11-24 DIAGNOSIS — N186 End stage renal disease: Secondary | ICD-10-CM | POA: Diagnosis not present

## 2023-11-25 DIAGNOSIS — Z992 Dependence on renal dialysis: Secondary | ICD-10-CM | POA: Diagnosis not present

## 2023-11-25 DIAGNOSIS — N186 End stage renal disease: Secondary | ICD-10-CM | POA: Diagnosis not present

## 2023-11-26 DIAGNOSIS — Z992 Dependence on renal dialysis: Secondary | ICD-10-CM | POA: Diagnosis not present

## 2023-11-26 DIAGNOSIS — N186 End stage renal disease: Secondary | ICD-10-CM | POA: Diagnosis not present

## 2023-11-26 LAB — SURGICAL PATHOLOGY

## 2023-11-27 DIAGNOSIS — Z992 Dependence on renal dialysis: Secondary | ICD-10-CM | POA: Diagnosis not present

## 2023-11-27 DIAGNOSIS — N186 End stage renal disease: Secondary | ICD-10-CM | POA: Diagnosis not present

## 2023-11-28 DIAGNOSIS — N186 End stage renal disease: Secondary | ICD-10-CM | POA: Diagnosis not present

## 2023-11-28 DIAGNOSIS — Z992 Dependence on renal dialysis: Secondary | ICD-10-CM | POA: Diagnosis not present

## 2023-11-29 DIAGNOSIS — Z992 Dependence on renal dialysis: Secondary | ICD-10-CM | POA: Diagnosis not present

## 2023-11-29 DIAGNOSIS — N186 End stage renal disease: Secondary | ICD-10-CM | POA: Diagnosis not present

## 2023-11-30 ENCOUNTER — Ambulatory Visit: Payer: Self-pay | Admitting: Dermatology

## 2023-11-30 DIAGNOSIS — Z992 Dependence on renal dialysis: Secondary | ICD-10-CM | POA: Diagnosis not present

## 2023-11-30 DIAGNOSIS — N186 End stage renal disease: Secondary | ICD-10-CM | POA: Diagnosis not present

## 2023-11-30 NOTE — Telephone Encounter (Signed)
-----   Message from Rexene Rattler sent at 11/30/2023 10:38 AM EDT -----  1. Skin, right shoulder - anterior :       SURFACE OF A DERMATOFIBROMA  Benign, no further treatment needed, observation  A dermatofibroma is a benign growth possibly related to trauma, such as an insect bite, cut from shaving, or inflamed acne-type bump.   - please call patient ----- Message ----- From: Interface, Lab In [facility] Sent: 11/26/2023   5:32 PM EDT To: Rexene Rattler, MD

## 2023-11-30 NOTE — Telephone Encounter (Signed)
 Left message on wife's voicemail to call office for husband's biopsy results.

## 2023-12-01 DIAGNOSIS — N186 End stage renal disease: Secondary | ICD-10-CM | POA: Diagnosis not present

## 2023-12-01 DIAGNOSIS — Z992 Dependence on renal dialysis: Secondary | ICD-10-CM | POA: Diagnosis not present

## 2023-12-01 NOTE — Telephone Encounter (Signed)
 Left patients wife message to call for bx results/sh

## 2023-12-02 DIAGNOSIS — N186 End stage renal disease: Secondary | ICD-10-CM | POA: Diagnosis not present

## 2023-12-02 DIAGNOSIS — Z992 Dependence on renal dialysis: Secondary | ICD-10-CM | POA: Diagnosis not present

## 2023-12-02 NOTE — Telephone Encounter (Signed)
Patient's spouse advised of BX results. aw 

## 2023-12-03 DIAGNOSIS — Z992 Dependence on renal dialysis: Secondary | ICD-10-CM | POA: Diagnosis not present

## 2023-12-03 DIAGNOSIS — N186 End stage renal disease: Secondary | ICD-10-CM | POA: Diagnosis not present

## 2023-12-04 DIAGNOSIS — Z992 Dependence on renal dialysis: Secondary | ICD-10-CM | POA: Diagnosis not present

## 2023-12-04 DIAGNOSIS — N186 End stage renal disease: Secondary | ICD-10-CM | POA: Diagnosis not present

## 2023-12-05 DIAGNOSIS — Z992 Dependence on renal dialysis: Secondary | ICD-10-CM | POA: Diagnosis not present

## 2023-12-05 DIAGNOSIS — N186 End stage renal disease: Secondary | ICD-10-CM | POA: Diagnosis not present

## 2023-12-06 DIAGNOSIS — Z992 Dependence on renal dialysis: Secondary | ICD-10-CM | POA: Diagnosis not present

## 2023-12-06 DIAGNOSIS — N186 End stage renal disease: Secondary | ICD-10-CM | POA: Diagnosis not present

## 2023-12-07 DIAGNOSIS — Z992 Dependence on renal dialysis: Secondary | ICD-10-CM | POA: Diagnosis not present

## 2023-12-07 DIAGNOSIS — N186 End stage renal disease: Secondary | ICD-10-CM | POA: Diagnosis not present

## 2023-12-08 DIAGNOSIS — Z992 Dependence on renal dialysis: Secondary | ICD-10-CM | POA: Diagnosis not present

## 2023-12-08 DIAGNOSIS — N186 End stage renal disease: Secondary | ICD-10-CM | POA: Diagnosis not present

## 2023-12-09 DIAGNOSIS — Z992 Dependence on renal dialysis: Secondary | ICD-10-CM | POA: Diagnosis not present

## 2023-12-09 DIAGNOSIS — N186 End stage renal disease: Secondary | ICD-10-CM | POA: Diagnosis not present

## 2023-12-10 DIAGNOSIS — Z992 Dependence on renal dialysis: Secondary | ICD-10-CM | POA: Diagnosis not present

## 2023-12-10 DIAGNOSIS — N186 End stage renal disease: Secondary | ICD-10-CM | POA: Diagnosis not present

## 2023-12-11 DIAGNOSIS — Z992 Dependence on renal dialysis: Secondary | ICD-10-CM | POA: Diagnosis not present

## 2023-12-11 DIAGNOSIS — N186 End stage renal disease: Secondary | ICD-10-CM | POA: Diagnosis not present

## 2023-12-12 DIAGNOSIS — Z992 Dependence on renal dialysis: Secondary | ICD-10-CM | POA: Diagnosis not present

## 2023-12-12 DIAGNOSIS — N186 End stage renal disease: Secondary | ICD-10-CM | POA: Diagnosis not present

## 2023-12-13 DIAGNOSIS — N186 End stage renal disease: Secondary | ICD-10-CM | POA: Diagnosis not present

## 2023-12-13 DIAGNOSIS — Z992 Dependence on renal dialysis: Secondary | ICD-10-CM | POA: Diagnosis not present

## 2023-12-14 DIAGNOSIS — N186 End stage renal disease: Secondary | ICD-10-CM | POA: Diagnosis not present

## 2023-12-14 DIAGNOSIS — Z992 Dependence on renal dialysis: Secondary | ICD-10-CM | POA: Diagnosis not present

## 2023-12-15 DIAGNOSIS — R296 Repeated falls: Secondary | ICD-10-CM | POA: Diagnosis not present

## 2023-12-15 DIAGNOSIS — R278 Other lack of coordination: Secondary | ICD-10-CM | POA: Diagnosis not present

## 2023-12-17 DIAGNOSIS — R296 Repeated falls: Secondary | ICD-10-CM | POA: Diagnosis not present

## 2023-12-17 DIAGNOSIS — R262 Difficulty in walking, not elsewhere classified: Secondary | ICD-10-CM | POA: Diagnosis not present

## 2023-12-17 DIAGNOSIS — R278 Other lack of coordination: Secondary | ICD-10-CM | POA: Diagnosis not present

## 2023-12-18 ENCOUNTER — Ambulatory Visit: Admitting: Internal Medicine

## 2023-12-19 DIAGNOSIS — Z992 Dependence on renal dialysis: Secondary | ICD-10-CM | POA: Diagnosis not present

## 2023-12-19 DIAGNOSIS — N186 End stage renal disease: Secondary | ICD-10-CM | POA: Diagnosis not present

## 2023-12-20 DIAGNOSIS — N186 End stage renal disease: Secondary | ICD-10-CM | POA: Diagnosis not present

## 2023-12-23 DIAGNOSIS — N186 End stage renal disease: Secondary | ICD-10-CM | POA: Diagnosis not present

## 2023-12-24 DIAGNOSIS — Z992 Dependence on renal dialysis: Secondary | ICD-10-CM | POA: Diagnosis not present

## 2023-12-24 DIAGNOSIS — N186 End stage renal disease: Secondary | ICD-10-CM | POA: Diagnosis not present

## 2023-12-25 DIAGNOSIS — N186 End stage renal disease: Secondary | ICD-10-CM | POA: Diagnosis not present

## 2023-12-25 DIAGNOSIS — Z992 Dependence on renal dialysis: Secondary | ICD-10-CM | POA: Diagnosis not present

## 2023-12-28 ENCOUNTER — Ambulatory Visit: Admitting: Internal Medicine

## 2023-12-29 DIAGNOSIS — R262 Difficulty in walking, not elsewhere classified: Secondary | ICD-10-CM | POA: Diagnosis not present

## 2023-12-29 DIAGNOSIS — R296 Repeated falls: Secondary | ICD-10-CM | POA: Diagnosis not present

## 2023-12-29 DIAGNOSIS — Z992 Dependence on renal dialysis: Secondary | ICD-10-CM | POA: Diagnosis not present

## 2023-12-29 DIAGNOSIS — R278 Other lack of coordination: Secondary | ICD-10-CM | POA: Diagnosis not present

## 2023-12-29 DIAGNOSIS — N186 End stage renal disease: Secondary | ICD-10-CM | POA: Diagnosis not present

## 2024-01-02 ENCOUNTER — Other Ambulatory Visit: Payer: Self-pay | Admitting: Internal Medicine

## 2024-01-02 DIAGNOSIS — N186 End stage renal disease: Secondary | ICD-10-CM | POA: Diagnosis not present

## 2024-01-02 DIAGNOSIS — Z992 Dependence on renal dialysis: Secondary | ICD-10-CM | POA: Diagnosis not present

## 2024-01-04 DIAGNOSIS — N186 End stage renal disease: Secondary | ICD-10-CM | POA: Diagnosis not present

## 2024-01-06 DIAGNOSIS — Z992 Dependence on renal dialysis: Secondary | ICD-10-CM | POA: Diagnosis not present

## 2024-01-06 DIAGNOSIS — N186 End stage renal disease: Secondary | ICD-10-CM | POA: Diagnosis not present

## 2024-01-08 DIAGNOSIS — N186 End stage renal disease: Secondary | ICD-10-CM | POA: Diagnosis not present

## 2024-01-09 DIAGNOSIS — N186 End stage renal disease: Secondary | ICD-10-CM | POA: Diagnosis not present

## 2024-01-10 DIAGNOSIS — N186 End stage renal disease: Secondary | ICD-10-CM | POA: Diagnosis not present

## 2024-01-12 DIAGNOSIS — N186 End stage renal disease: Secondary | ICD-10-CM | POA: Diagnosis not present

## 2024-01-12 DIAGNOSIS — Z992 Dependence on renal dialysis: Secondary | ICD-10-CM | POA: Diagnosis not present

## 2024-01-20 ENCOUNTER — Ambulatory Visit: Admitting: Internal Medicine

## 2024-01-20 ENCOUNTER — Other Ambulatory Visit: Payer: Self-pay | Admitting: Nurse Practitioner

## 2024-01-20 ENCOUNTER — Encounter: Payer: Self-pay | Admitting: Internal Medicine

## 2024-01-27 DIAGNOSIS — Z961 Presence of intraocular lens: Secondary | ICD-10-CM | POA: Diagnosis not present

## 2024-01-27 DIAGNOSIS — E113513 Type 2 diabetes mellitus with proliferative diabetic retinopathy with macular edema, bilateral: Secondary | ICD-10-CM | POA: Diagnosis not present

## 2024-01-27 DIAGNOSIS — Z992 Dependence on renal dialysis: Secondary | ICD-10-CM | POA: Diagnosis not present

## 2024-01-27 DIAGNOSIS — N186 End stage renal disease: Secondary | ICD-10-CM | POA: Diagnosis not present

## 2024-01-27 DIAGNOSIS — Z794 Long term (current) use of insulin: Secondary | ICD-10-CM | POA: Diagnosis not present

## 2024-01-27 DIAGNOSIS — H4052X3 Glaucoma secondary to other eye disorders, left eye, severe stage: Secondary | ICD-10-CM | POA: Diagnosis not present

## 2024-01-27 DIAGNOSIS — E119 Type 2 diabetes mellitus without complications: Secondary | ICD-10-CM | POA: Diagnosis not present

## 2024-02-10 ENCOUNTER — Encounter

## 2024-02-10 DIAGNOSIS — E103593 Type 1 diabetes mellitus with proliferative diabetic retinopathy without macular edema, bilateral: Secondary | ICD-10-CM

## 2024-02-10 DIAGNOSIS — E785 Hyperlipidemia, unspecified: Secondary | ICD-10-CM

## 2024-02-10 DIAGNOSIS — D638 Anemia in other chronic diseases classified elsewhere: Secondary | ICD-10-CM

## 2024-02-12 ENCOUNTER — Ambulatory Visit: Admitting: Internal Medicine

## 2024-02-22 ENCOUNTER — Ambulatory Visit: Admitting: Gastroenterology

## 2024-02-25 ENCOUNTER — Other Ambulatory Visit: Payer: Self-pay | Admitting: Gastroenterology

## 2024-03-08 ENCOUNTER — Telehealth: Payer: Self-pay | Admitting: Nutrition

## 2024-03-08 NOTE — Telephone Encounter (Signed)
 Left message on my machine that patient is wanting to return to see Dr. Sam.  Wife promised patient will not miss any appointments.  Please advise

## 2024-04-01 ENCOUNTER — Ambulatory Visit: Admitting: Gastroenterology

## 2024-04-11 ENCOUNTER — Telehealth (INDEPENDENT_AMBULATORY_CARE_PROVIDER_SITE_OTHER): Payer: Self-pay

## 2024-04-11 NOTE — Telephone Encounter (Signed)
 I attempted to contact the patient to schedule a permcath insertion. As I started leaving the message someone hung up the phone.

## 2024-04-13 ENCOUNTER — Other Ambulatory Visit (INDEPENDENT_AMBULATORY_CARE_PROVIDER_SITE_OTHER): Payer: Self-pay | Admitting: Vascular Surgery

## 2024-04-13 ENCOUNTER — Telehealth (INDEPENDENT_AMBULATORY_CARE_PROVIDER_SITE_OTHER): Payer: Self-pay

## 2024-04-13 DIAGNOSIS — N186 End stage renal disease: Secondary | ICD-10-CM

## 2024-04-13 NOTE — Telephone Encounter (Signed)
 Spoke with the patient's spouse and he is scheduled with Dr. Marea on 04/21/24 with a 2:00 pm arrival time to the [facility] for a permcath insertion. Pre-procedure instructions were discussed and will be sent to Mychart and mailed.

## 2024-04-18 ENCOUNTER — Other Ambulatory Visit: Payer: Self-pay | Admitting: Nurse Practitioner

## 2024-04-21 ENCOUNTER — Other Ambulatory Visit: Payer: Self-pay

## 2024-04-21 ENCOUNTER — Encounter
Admission: RE | Disposition: A | Discharge status: Home / Self Care | Payer: Self-pay | Source: Home / Self Care | Attending: Vascular Surgery

## 2024-04-21 ENCOUNTER — Encounter: Payer: Self-pay | Admitting: Vascular Surgery

## 2024-04-21 ENCOUNTER — Ambulatory Visit
Admission: RE | Admit: 2024-04-21 | Discharge: 2024-04-21 | Disposition: A | Discharge status: Home / Self Care | Attending: Vascular Surgery | Admitting: Vascular Surgery

## 2024-04-21 DIAGNOSIS — I120 Hypertensive chronic kidney disease with stage 5 chronic kidney disease or end stage renal disease: Secondary | ICD-10-CM | POA: Insufficient documentation

## 2024-04-21 DIAGNOSIS — Z992 Dependence on renal dialysis: Secondary | ICD-10-CM | POA: Diagnosis not present

## 2024-04-21 DIAGNOSIS — N186 End stage renal disease: Secondary | ICD-10-CM | POA: Diagnosis present

## 2024-04-21 DIAGNOSIS — E1122 Type 2 diabetes mellitus with diabetic chronic kidney disease: Secondary | ICD-10-CM | POA: Insufficient documentation

## 2024-04-21 HISTORY — PX: DIALYSIS/PERMA CATHETER INSERTION: CATH118288

## 2024-04-21 LAB — GLUCOSE, CAPILLARY: Glucose-Capillary: 128 mg/dL — ABNORMAL HIGH (ref 70–99)

## 2024-04-21 SURGERY — DIALYSIS/PERMA CATHETER INSERTION
Anesthesia: Moderate Sedation

## 2024-04-21 MED ORDER — SODIUM CHLORIDE 0.9 % IV SOLN: INTRAVENOUS | Status: DC

## 2024-04-21 MED ORDER — DIPHENHYDRAMINE HCL 50 MG/ML IJ SOLN: 50.0000 mg | Freq: Once | INTRAMUSCULAR | Status: DC | PRN

## 2024-04-21 MED ORDER — MIDAZOLAM HCL 2 MG/ML PO SYRP: 8.0000 mg | ORAL_SOLUTION | Freq: Once | ORAL | Status: DC | PRN

## 2024-04-21 MED ORDER — CEFAZOLIN SODIUM-DEXTROSE 2-4 GM/100ML-% IV SOLN: 2.0000 g | INTRAVENOUS | Status: DC

## 2024-04-21 MED ORDER — HEPARIN SODIUM (PORCINE) 10000 UNIT/ML IJ SOLN
INTRAMUSCULAR | Status: DC | PRN
  Administered 2024-04-21: 10000 [IU]

## 2024-04-21 MED ORDER — FENTANYL CITRATE (PF) 100 MCG/2ML IJ SOLN
INTRAMUSCULAR | Status: DC | PRN
  Administered 2024-04-21: 50 ug via INTRAVENOUS

## 2024-04-21 MED ORDER — LIDOCAINE-EPINEPHRINE (PF) 1 %-1:200000 IJ SOLN
INTRAMUSCULAR | Status: DC | PRN
  Administered 2024-04-21: 20 mL via INTRADERMAL

## 2024-04-21 MED ORDER — FENTANYL CITRATE (PF) 50 MCG/ML IJ SOSY
PREFILLED_SYRINGE | INTRAMUSCULAR | Status: AC
  Filled 2024-04-21: qty 1

## 2024-04-21 MED ORDER — FAMOTIDINE 20 MG PO TABS: 40.0000 mg | ORAL_TABLET | Freq: Once | ORAL | Status: DC | PRN

## 2024-04-21 MED ORDER — MIDAZOLAM HCL 2 MG/2ML IJ SOLN
INTRAMUSCULAR | Status: AC
  Filled 2024-04-21: qty 2

## 2024-04-21 MED ORDER — HEPARIN (PORCINE) IN NACL 1000-0.9 UT/500ML-% IV SOLN
INTRAVENOUS | Status: DC | PRN
  Administered 2024-04-21: 500 mL

## 2024-04-21 MED ORDER — ONDANSETRON HCL 4 MG/2ML IJ SOLN: 4.0000 mg | Freq: Four times a day (QID) | INTRAMUSCULAR | Status: DC | PRN

## 2024-04-21 MED ORDER — METHYLPREDNISOLONE SODIUM SUCC 125 MG IJ SOLR: 125.0000 mg | Freq: Once | INTRAMUSCULAR | Status: DC | PRN

## 2024-04-21 MED ORDER — MIDAZOLAM HCL (PF) 2 MG/2ML IJ SOLN
INTRAMUSCULAR | Status: DC | PRN
  Administered 2024-04-21: 2 mg via INTRAVENOUS

## 2024-04-21 MED ORDER — CEFAZOLIN SODIUM-DEXTROSE 1-4 GM/50ML-% IV SOLN
INTRAVENOUS | Status: AC
  Filled 2024-04-21: qty 50

## 2024-04-21 MED ORDER — MIDODRINE HCL 5 MG PO TABS
5.0000 mg | ORAL_TABLET | ORAL | Status: AC
  Administered 2024-04-21: 5 mg via ORAL
  Filled 2024-04-21: qty 1

## 2024-04-21 MED ORDER — HYDROMORPHONE HCL 1 MG/ML IJ SOLN: 1.0000 mg | Freq: Once | INTRAMUSCULAR | Status: DC | PRN

## 2024-04-21 SURGICAL SUPPLY — 7 items
BIOPATCH RED 1 DISK 7.0 (GAUZE/BANDAGES/DRESSINGS) IMPLANT
CATH HEMO 15FR 19 SMART SEAL (HEMODIALYSIS SUPPLIES) IMPLANT
COVER PROBE ULTRASOUND 5X96 (MISCELLANEOUS) IMPLANT
DERMABOND ADVANCED .7 DNX12 (GAUZE/BANDAGES/DRESSINGS) IMPLANT
PACK ANGIOGRAPHY (CUSTOM PROCEDURE TRAY) ×1 IMPLANT
SUT MNCRL AB 4-0 PS2 18 (SUTURE) IMPLANT
SUT PROLENE 0 CT 1 30 (SUTURE) IMPLANT

## 2024-04-21 NOTE — H&P (Addendum)
 "  [facility] Admission History & Physical  MRN : [id number]  Chase Clark is a 59 y.o. (1965/05/05) male who presents with chief complaint of No chief complaint on file. SABRA  History of Present Illness: Patient with end-stage renal disease who presents for PermCath placement.  Has previously had peritoneal dialysis but now requires hemodialysis and we are asked to place a catheter.  No fevers or chills.  Current Facility-Administered Medications  Medication Dose Route Frequency Provider Last Rate Last Admin   0.9 %  sodium chloride  infusion   Intravenous Continuous Brown, Fallon E, NP       ceFAZolin  (ANCEF ) IVPB 2g/100 mL premix  2 g Intravenous 30 min Pre-Op Brown, Fallon E, NP       diphenhydrAMINE  (BENADRYL ) injection 50 mg  50 mg Intravenous Once PRN Brown, Fallon E, NP       famotidine  (PEPCID ) tablet 40 mg  40 mg Oral Once PRN Brown, Fallon E, NP       HYDROmorphone  (DILAUDID ) injection 1 mg  1 mg Intravenous Once PRN Brown, Fallon E, NP       methylPREDNISolone  sodium succinate (SOLU-MEDROL ) 125 mg/2 mL injection 125 mg  125 mg Intravenous Once PRN Brown, Fallon E, NP       midazolam  (VERSED ) 2 MG/ML syrup 8 mg  8 mg Oral Once PRN Brown, Fallon E, NP       ondansetron  (ZOFRAN ) injection 4 mg  4 mg Intravenous Q6H PRN Brown, Fallon E, NP        Past Medical History:  Diagnosis Date   Anemia    Arthritis    Asthma    as child   Atypical mole 09/22/2022   R spinal mid back, needs excision   Barrett's esophagus 02/16/2014   short segment   Cataract    Coronary artery disease    DDD (degenerative disc disease), cervical    Diabetes mellitus without complication (HCC)    Diabetic osteomyelitis (HCC)    Diabetic peripheral neuropathy (HCC)    Diabetic ulcer of foot with muscle involvement without evidence of necrosis (HCC)    DIABETIC ULCERATIONS ASSOCIATED WITH IRRITATION LATERAL ANKLE LEFT GREATER THAN RIGHT WITH MILD CELLULITIS    Diverticulosis     DKA (diabetic ketoacidoses) 08/23/2017   Edema, lower extremity    ESRD on hemodialysis (HCC)    Fatty liver    Gastroparesis 2015   GERD (gastroesophageal reflux disease)    Gilbert's disease    Glaucoma    Hx of BKA, left (HCC)    Hx of CABG 01/27/2019   2 vessels; LIMA-LAD, SVG-D1   Hx of heart artery stent 11/20/2009   80% D1 stenosis - 2.5 x 18 mm Xience V Everolimus (DES) stent x 1 placed   Hyperlipidemia    Hypertension    Left below-knee amputee (HCC)    Legally blind    Myocardial infarction (HCC) 11/2009   Neuropathy    Osteoarthritis of spine    Osteomyelitis of left foot (HCC)    Peritoneal dialysis catheter in place    Does dialysis over night.   PONV (postoperative nausea and vomiting)    PVD (peripheral vascular disease)    Seasonal allergies    Shoulder pain    Tubular adenoma of colon    Ulcer of foot (HCC) 11/17/2012   DIABETIC ULCERATIONS ASSOCIATED WITH IRRITATION LATERAL ANKLE LEFT GREATER THAN RIGHT WITH MILD CELLULITIS    Past Surgical History:  Procedure Laterality  Date   ABDOMINAL AORTOGRAM N/A 05/20/2016   Procedure: Abdominal Aortogram possible intervention;  Surgeon: Cordella KANDICE Shawl, MD;  Location: [facility] INVASIVE CV LAB;  Service: Cardiovascular;  Laterality: N/A;   AMPUTATION Left 09/05/2017   Procedure: AMPUTATION BELOW KNEE;  Surgeon: Dozier Soulier, MD;  Location: [facility] OR;  Service: Orthopedics;  Laterality: Left;   ANTERIOR CERVICAL DECOMP/DISCECTOMY FUSION N/A 05/07/2017   Procedure: ANTERIOR CERVICAL DECOMPRESSION/DISCECTOMY CORTEZ REDHEAD PROSTHESIS,PLATE/SCREWS CERVICAL FIVE - CERVICAL SIX;  Surgeon: Mavis Purchase, MD;  Location: [facility] OR;  Service: Neurosurgery;  Laterality: N/A;   CAPD INSERTION N/A 08/30/2020   Procedure: LAPAROSCOPIC INSERTION CONTINUOUS AMBULATORY PERITONEAL DIALYSIS  (CAPD) CATHETER;  Surgeon: Desiderio Schanz, MD;  Location: [facility] ORS;  Service: General;  Laterality: N/A;   CATARACT EXTRACTION W/ INTRAOCULAR LENS  IMPLANT     CATARACT EXTRACTION W/PHACO Left 02/26/2017   Procedure: CATARACT EXTRACTION PHACO AND INTRAOCULAR LENS PLACEMENT (IOC);  Surgeon: Myrna Adine Anes, MD;  Location: [facility] ORS;  Service: Ophthalmology;  Laterality: Left;  Lot # [id number] H US : 00:25.3 AP%: 6.2 CDE: 1.59    CHOLECYSTECTOMY N/A    COLONOSCOPY WITH PROPOFOL  N/A 05/30/2021   Procedure: COLONOSCOPY WITH PROPOFOL ;  Surgeon: Albertus Gordy HERO, MD;  Location: [facility] ENDOSCOPY;  Service: Gastroenterology;  Laterality: N/A;   CORONARY ANGIOPLASTY WITH STENT PLACEMENT  11/20/2009   80% D1 stenosis - 2.5 x 18 mm Xience V Everolimus (DES) stent x 1 placed; LOCATION: [facility]; SURGEON: Vinie Jude, MD   CORONARY ARTERY BYPASS GRAFT N/A 01/27/2019   Procedure: OFF PUMP CORONARY ARTERY BYPASS GRAFTING (CABG) X 2 WITH ENDOSCOPIC HARVESTING OF RIGHT GREATER SAPHENOUS VEIN. LIMA TO LAD;  Surgeon: Shyrl Linnie KIDD, MD;  Location: [facility] OR;  Service: Open Heart Surgery;  Laterality: N/A;   CORONARY STENT INTERVENTION N/A 01/21/2019   Procedure: CORONARY STENT INTERVENTION;  Surgeon: Claudene Victory ORN, MD;  Location: [facility] INVASIVE CV LAB;  Service: Cardiovascular;  Laterality: N/A;   EPIBLEPHERON REPAIR WITH TEAR DUCT PROBING     EYE SURGERY Right 02/09/2014   cataract extraction   INSERTION EXPRESS TUBE SHUNT Right 09/06/2015   Procedure: INSERTION AHMED TUBE SHUNT with tutoplast allograft;  Surgeon: Adine Anes Myrna, MD;  Location: [facility] ORS;  Service: Ophthalmology;  Laterality: Right;   laparoscopic insertion continuous peritoneal dialysis  2022   LEFT HEART CATH AND CORONARY ANGIOGRAPHY N/A 01/20/2019   Procedure: LEFT HEART CATH AND CORONARY ANGIOGRAPHY;  Surgeon: Court Dorn PARAS, MD;  Location: [facility] CV LAB;  Service: Cardiovascular;  Laterality: N/A;   POLYPECTOMY  05/30/2021   Procedure: POLYPECTOMY;  Surgeon: Albertus Gordy HERO, MD;  Location: [facility] ENDOSCOPY;  Service: Gastroenterology;;   TEE WITHOUT CARDIOVERSION N/A 01/27/2019   Procedure:  TRANSESOPHAGEAL ECHOCARDIOGRAM (TEE);  Surgeon: Shyrl Linnie KIDD, MD;  Location: [facility] OR;  Service: Open Heart Surgery;  Laterality: N/A;   VASECTOMY       Social History[1]   Family History  Problem Relation Age of Onset   Hyperlipidemia Mother    Diabetes Mother    Liver disease Mother        NASH   Other Mother        immune thrombocytopenic pupura (ITP)   Heart attack Father    Hyperlipidemia Father    Diabetes Father    Hypertension Father    Lymphoma Father    Hemochromatosis Other        Grandmother (? which side)   Polycythemia Other    Colon cancer Neg Hx    Esophageal  cancer Neg Hx    Rectal cancer Neg Hx    Stomach cancer Neg Hx     Allergies[2]   REVIEW OF SYSTEMS (Negative unless checked)  Constitutional: [] Weight loss  [] Fever  [] Chills Cardiac: [] Chest pain   [] Chest pressure   [] Palpitations   [] Shortness of breath when laying flat   [] Shortness of breath at rest   [x] Shortness of breath with exertion. Vascular:  [] Pain in legs with walking   [] Pain in legs at rest   [] Pain in legs when laying flat   [] Claudication   [] Pain in feet when walking  [] Pain in feet at rest  [] Pain in feet when laying flat   [] History of DVT   [] Phlebitis   [] Swelling in legs   [] Varicose veins   [] Non-healing ulcers Pulmonary:   [] Uses home oxygen   [] Productive cough   [] Hemoptysis   [] Wheeze  [] COPD   [x] Asthma Neurologic:  [] Dizziness  [] Blackouts   [] Seizures   [] History of stroke   [] History of TIA  [] Aphasia   [] Temporary blindness   [] Dysphagia   [] Weakness or numbness in arms   [] Weakness or numbness in legs Musculoskeletal:  [x] Arthritis   [] Joint swelling   [x] Joint pain   [] Low back pain Hematologic:  [] Easy bruising  [] Easy bleeding   [] Hypercoagulable state   [x] Anemic  [] Hepatitis Gastrointestinal:  [] Blood in stool   [] Vomiting blood  [] Gastroesophageal reflux/heartburn   [] Difficulty swallowing. Genitourinary:  [x] Chronic kidney disease   [] Difficult urination   [] Frequent urination  [] Burning with urination   [] Blood in urine Skin:  [] Rashes   [] Ulcers   [] Wounds Psychological:  [] History of anxiety   []  History of major depression.  Physical Examination  Vitals:   04/21/24 1341  BP: (!) 137/97  Pulse: 71  Resp: 20  TempSrc: Oral  SpO2: 97%  Weight: 112.1 kg  Height: 5' 10 (1.778 m)   Body mass index is 35.46 kg/m. Gen: WD/WN, NAD Head: [place]/AT, No temporalis wasting.  Ear/Nose/Throat: Hearing grossly intact, nares w/o erythema or drainage, oropharynx w/o Erythema/Exudate,  Eyes: Conjunctiva clear, sclera non-icteric Neck: Trachea midline.  No JVD.  Pulmonary:  Good air movement, respirations not labored, no use of accessory muscles.  Cardiac: RRR, normal S1, S2. Vascular:  Vessel Right Left  Radial Palpable Palpable                       Musculoskeletal: M/S 5/5 throughout.  Extremities without ischemic changes.  Left BKA Neurologic: Sensation grossly intact in extremities.  Symmetrical.  Speech is fluent. Motor exam as listed above. Psychiatric: Judgment intact, Mood & affect appropriate for pt's clinical situation. Dermatologic: No rashes or ulcers noted.  No cellulitis or open wounds.      CBC Lab Results  Component Value Date   WBC 10.1 08/27/2023   HGB 10.9 (L) 08/27/2023   HCT 33.2 (L) 08/27/2023   MCV 100.6 (H) 08/27/2023   PLT 176 08/27/2023    BMET    Component Value Date/Time   NA 131 (L) 08/27/2023 2211   NA 140 02/14/2019 1038   K 5.1 08/27/2023 2211   K 4.5 12/29/2013 1521   CL 99 08/27/2023 2211   CO2 19 (L) 08/27/2023 2211   GLUCOSE 159 (H) 08/27/2023 2211   BUN 45 (H) 08/27/2023 2211   BUN 26 (H) 02/14/2019 1038   CREATININE 9.08 (H) 08/27/2023 2211   CREATININE 2.42 (H) 12/31/2018 1454   CALCIUM  8.3 (L) 08/27/2023 2211  GFRNONAA 6 (L) 08/27/2023 2211   GFRAA 28 (L) 03/09/2019 1551   CrCl cannot be calculated (Patient's most recent lab result is older than the maximum 21 days  allowed.).  COAG Lab Results  Component Value Date   INR 1.2 07/26/2023   INR 1.3 (H) 01/27/2019   INR 1.1 01/19/2019    Radiology No results found.   Assessment/Plan 1.  End-stage renal disease.  Switching to hemodialysis and we are asked to place a PermCath.  Risks and benefits are discussed. 2.  Diabetes.  Underlying cause of his renal failure and multiple other issues blood glucose control important in reducing the progression of atherosclerotic disease. Also, involved in wound healing. On appropriate medications. 3. Hypertension. Stable on outpatient medications and blood pressure control important in reducing the progression of atherosclerotic disease. On appropriate oral medications.    Selinda Gu, MD  04/21/2024 1:57 PM        [1]  Social History Tobacco Use   Smoking status: Former    Current packs/day: 0.00    Average packs/day: 0.5 packs/day for 20.0 years (10.0 ttl pk-yrs)    Types: Cigarettes    Start date: 03/14/1988    Quit date: 03/14/2008    Years since quitting: 16.1   Smokeless tobacco: Never  Vaping Use   Vaping status: Never Used  Substance Use Topics   Alcohol  use: Not Currently    Comment: RARELY   Drug use: No  [2]  Allergies Allergen Reactions   Baclofen Other (See Comments)    Disorientation, Contraindication with dialysis    Nsaids Other (See Comments)    CKD on dialysis    Statins Other (See Comments)    Muscle pain   "

## 2024-04-21 NOTE — Progress Notes (Signed)
 Midodrine  5 mg p.o. given now per MD orders. Pt. Remains asymptomatic. Wife at bedside with pt.

## 2024-04-21 NOTE — Progress Notes (Signed)
 BP 70-80 systolic for last 30 min. Pt. Drank 480 ml H2O. HR steady at 68-70. MD made aware via phone call & secure chat. Await MD orders. Pt. States I feel o.k. remains asymptomatic. HOB 45 degrees up. SABRA

## 2024-04-21 NOTE — Progress Notes (Signed)
 Dr. Marea in room, speaking with pt. And his wife re: procedural results. Both verbalized understanding of conversation with MD.

## 2024-04-21 NOTE — Op Note (Signed)
 OPERATIVE NOTE    PRE-OPERATIVE DIAGNOSIS: 1. ESRD 2.  Failed peritoneal dialysis  POST-OPERATIVE DIAGNOSIS: same as above  PROCEDURE: Ultrasound guidance for vascular access to the right internal jugular vein Fluoroscopic guidance for placement of catheter Placement of a 19 cm tip to cuff tunneled hemodialysis catheter via the right internal jugular vein  SURGEON: Selinda Gu, MD  ANESTHESIA:  Local with Moderate conscious sedation for approximately 16 minutes using 2 mg of Versed  and 50 mcg of Fentanyl   ESTIMATED BLOOD LOSS: 5 cc  FLUORO TIME: less than one minute  CONTRAST: none  FINDING(S): 1.  Patent right internal jugular vein  SPECIMEN(S):  None  INDICATIONS:   Chase Clark is a 59 y.o.male who presents with renal failure who has been on peritoneal dialysis but is now switching to hemodialysis.  The patient needs long term dialysis access for their ESRD, and a Permcath is necessary.  Risks and benefits are discussed and informed consent is obtained.    DESCRIPTION: After obtaining full informed written consent, the patient was brought back to the vascular suited. The patient's right neck and chest were sterilely prepped and draped in a sterile surgical field was created. Moderate conscious sedation was administered during a face to face encounter with the patient throughout the procedure with my supervision of the RN administering medicines and monitoring the patient's vital signs, pulse oximetry, telemetry and mental status throughout from the start of the procedure until the patient was taken to the recovery room.  The right internal jugular vein was visualized with ultrasound and found to be patent. It was then accessed under direct ultrasound guidance and a permanent image was recorded. A wire was placed. After skin nick and dilatation, the peel-away sheath was placed over the wire. I then turned my attention to an area under the clavicle. Approximately 1-2  fingerbreadths below the clavicle a small counterincision was created and tunneled from the subclavicular incision to the access site. Using fluoroscopic guidance, a 19 centimeter tip to cuff tunneled hemodialysis catheter was selected, and tunneled from the subclavicular incision to the access site. It was then placed through the peel-away sheath and the peel-away sheath was removed. Using fluoroscopic guidance the catheter tips were parked in the right atrium. The appropriate distal connectors were placed. It withdrew blood well and flushed easily with heparinized saline and a concentrated heparin  solution was then placed. It was secured to the chest wall with 2 Prolene sutures. The access incision was closed single 4-0 Monocryl. A 4-0 Monocryl pursestring suture was placed around the exit site. Sterile dressings were placed. The patient tolerated the procedure well and was taken to the recovery room in stable condition.  COMPLICATIONS: None  CONDITION: Stable  Selinda Gu, MD 04/21/2024 2:27 PM   This note was created with Dragon Medical transcription system. Any errors in dictation are purely unintentional.

## 2024-04-22 ENCOUNTER — Encounter: Payer: Self-pay | Admitting: Vascular Surgery

## 2024-04-22 ENCOUNTER — Other Ambulatory Visit: Payer: Self-pay | Admitting: Gastroenterology

## 2024-04-25 ENCOUNTER — Inpatient Hospital Stay

## 2024-04-25 ENCOUNTER — Encounter: Payer: Self-pay | Admitting: Radiology

## 2024-04-25 ENCOUNTER — Emergency Department

## 2024-04-25 ENCOUNTER — Inpatient Hospital Stay: Admit: 2024-04-25 | Discharge: 2024-04-25 | Disposition: A | Attending: Critical Care Medicine

## 2024-04-25 DIAGNOSIS — I252 Old myocardial infarction: Secondary | ICD-10-CM

## 2024-04-25 DIAGNOSIS — E8729 Other acidosis: Secondary | ICD-10-CM

## 2024-04-25 DIAGNOSIS — I255 Ischemic cardiomyopathy: Secondary | ICD-10-CM | POA: Diagnosis present

## 2024-04-25 DIAGNOSIS — E1143 Type 2 diabetes mellitus with diabetic autonomic (poly)neuropathy: Secondary | ICD-10-CM | POA: Diagnosis present

## 2024-04-25 DIAGNOSIS — E1151 Type 2 diabetes mellitus with diabetic peripheral angiopathy without gangrene: Secondary | ICD-10-CM | POA: Diagnosis present

## 2024-04-25 DIAGNOSIS — K219 Gastro-esophageal reflux disease without esophagitis: Secondary | ICD-10-CM | POA: Diagnosis present

## 2024-04-25 DIAGNOSIS — Z89512 Acquired absence of left leg below knee: Secondary | ICD-10-CM | POA: Diagnosis not present

## 2024-04-25 DIAGNOSIS — E872 Acidosis, unspecified: Secondary | ICD-10-CM | POA: Diagnosis present

## 2024-04-25 DIAGNOSIS — I462 Cardiac arrest due to underlying cardiac condition: Secondary | ICD-10-CM | POA: Diagnosis present

## 2024-04-25 DIAGNOSIS — Z833 Family history of diabetes mellitus: Secondary | ICD-10-CM

## 2024-04-25 DIAGNOSIS — Z992 Dependence on renal dialysis: Secondary | ICD-10-CM

## 2024-04-25 DIAGNOSIS — G9340 Encephalopathy, unspecified: Secondary | ICD-10-CM | POA: Diagnosis not present

## 2024-04-25 DIAGNOSIS — Z66 Do not resuscitate: Secondary | ICD-10-CM | POA: Diagnosis present

## 2024-04-25 DIAGNOSIS — D72829 Elevated white blood cell count, unspecified: Secondary | ICD-10-CM | POA: Diagnosis not present

## 2024-04-25 DIAGNOSIS — I469 Cardiac arrest, cause unspecified: Secondary | ICD-10-CM | POA: Diagnosis present

## 2024-04-25 DIAGNOSIS — J9601 Acute respiratory failure with hypoxia: Secondary | ICD-10-CM | POA: Diagnosis present

## 2024-04-25 DIAGNOSIS — J9600 Acute respiratory failure, unspecified whether with hypoxia or hypercapnia: Secondary | ICD-10-CM

## 2024-04-25 DIAGNOSIS — E119 Type 2 diabetes mellitus without complications: Secondary | ICD-10-CM

## 2024-04-25 DIAGNOSIS — K3184 Gastroparesis: Secondary | ICD-10-CM | POA: Diagnosis present

## 2024-04-25 DIAGNOSIS — E1142 Type 2 diabetes mellitus with diabetic polyneuropathy: Secondary | ICD-10-CM | POA: Diagnosis present

## 2024-04-25 DIAGNOSIS — N186 End stage renal disease: Secondary | ICD-10-CM | POA: Diagnosis present

## 2024-04-25 DIAGNOSIS — H548 Legal blindness, as defined in USA: Secondary | ICD-10-CM | POA: Diagnosis present

## 2024-04-25 DIAGNOSIS — I2510 Atherosclerotic heart disease of native coronary artery without angina pectoris: Secondary | ICD-10-CM | POA: Diagnosis present

## 2024-04-25 DIAGNOSIS — G9341 Metabolic encephalopathy: Secondary | ICD-10-CM | POA: Diagnosis present

## 2024-04-25 DIAGNOSIS — E871 Hypo-osmolality and hyponatremia: Secondary | ICD-10-CM | POA: Diagnosis present

## 2024-04-25 DIAGNOSIS — D631 Anemia in chronic kidney disease: Secondary | ICD-10-CM | POA: Diagnosis present

## 2024-04-25 DIAGNOSIS — Z951 Presence of aortocoronary bypass graft: Secondary | ICD-10-CM

## 2024-04-25 DIAGNOSIS — N189 Chronic kidney disease, unspecified: Secondary | ICD-10-CM | POA: Diagnosis not present

## 2024-04-25 DIAGNOSIS — E785 Hyperlipidemia, unspecified: Secondary | ICD-10-CM | POA: Diagnosis present

## 2024-04-25 DIAGNOSIS — I959 Hypotension, unspecified: Secondary | ICD-10-CM | POA: Diagnosis not present

## 2024-04-25 DIAGNOSIS — Z515 Encounter for palliative care: Secondary | ICD-10-CM | POA: Diagnosis not present

## 2024-04-25 DIAGNOSIS — I5032 Chronic diastolic (congestive) heart failure: Secondary | ICD-10-CM | POA: Diagnosis present

## 2024-04-25 DIAGNOSIS — J9602 Acute respiratory failure with hypercapnia: Secondary | ICD-10-CM | POA: Diagnosis present

## 2024-04-25 DIAGNOSIS — R7401 Elevation of levels of liver transaminase levels: Secondary | ICD-10-CM | POA: Diagnosis not present

## 2024-04-25 DIAGNOSIS — E1122 Type 2 diabetes mellitus with diabetic chronic kidney disease: Secondary | ICD-10-CM | POA: Diagnosis present

## 2024-04-25 DIAGNOSIS — K760 Fatty (change of) liver, not elsewhere classified: Secondary | ICD-10-CM | POA: Diagnosis present

## 2024-04-25 DIAGNOSIS — I509 Heart failure, unspecified: Secondary | ICD-10-CM | POA: Diagnosis not present

## 2024-04-25 DIAGNOSIS — Z794 Long term (current) use of insulin: Secondary | ICD-10-CM

## 2024-04-25 DIAGNOSIS — I132 Hypertensive heart and chronic kidney disease with heart failure and with stage 5 chronic kidney disease, or end stage renal disease: Secondary | ICD-10-CM | POA: Diagnosis present

## 2024-04-25 DIAGNOSIS — Z87891 Personal history of nicotine dependence: Secondary | ICD-10-CM

## 2024-04-25 DIAGNOSIS — I214 Non-ST elevation (NSTEMI) myocardial infarction: Secondary | ICD-10-CM | POA: Diagnosis present

## 2024-04-25 DIAGNOSIS — Z955 Presence of coronary angioplasty implant and graft: Secondary | ICD-10-CM

## 2024-04-25 DIAGNOSIS — G253 Myoclonus: Secondary | ICD-10-CM | POA: Diagnosis present

## 2024-04-25 DIAGNOSIS — R570 Cardiogenic shock: Secondary | ICD-10-CM | POA: Diagnosis present

## 2024-04-25 DIAGNOSIS — Z79899 Other long term (current) drug therapy: Secondary | ICD-10-CM

## 2024-04-25 DIAGNOSIS — Z8249 Family history of ischemic heart disease and other diseases of the circulatory system: Secondary | ICD-10-CM

## 2024-04-25 DIAGNOSIS — Z807 Family history of other malignant neoplasms of lymphoid, hematopoietic and related tissues: Secondary | ICD-10-CM

## 2024-04-25 DIAGNOSIS — G4089 Other seizures: Secondary | ICD-10-CM | POA: Diagnosis not present

## 2024-04-25 DIAGNOSIS — Z1152 Encounter for screening for COVID-19: Secondary | ICD-10-CM

## 2024-04-25 DIAGNOSIS — R7989 Other specified abnormal findings of blood chemistry: Secondary | ICD-10-CM | POA: Diagnosis not present

## 2024-04-25 DIAGNOSIS — Z83438 Family history of other disorder of lipoprotein metabolism and other lipidemia: Secondary | ICD-10-CM

## 2024-04-25 LAB — CBC
HCT: 33.7 % — ABNORMAL LOW (ref 39.0–52.0)
HCT: 28 % — ABNORMAL LOW (ref 39.0–52.0)
Hemoglobin: 10.6 g/dL — ABNORMAL LOW (ref 13.0–17.0)
Hemoglobin: 9.5 g/dL — ABNORMAL LOW (ref 13.0–17.0)
MCH: 35.3 pg — ABNORMAL HIGH (ref 26.0–34.0)
MCH: 36.5 pg — ABNORMAL HIGH (ref 26.0–34.0)
MCHC: 31.5 g/dL (ref 30.0–36.0)
MCHC: 33.9 g/dL (ref 30.0–36.0)
MCV: 112.3 fL — ABNORMAL HIGH (ref 80.0–100.0)
MCV: 107.7 fL — ABNORMAL HIGH (ref 80.0–100.0)
Platelets: 276 K/uL (ref 150–400)
Platelets: 259 K/uL (ref 150–400)
RBC: 3 MIL/uL — ABNORMAL LOW (ref 4.22–5.81)
RBC: 2.6 MIL/uL — ABNORMAL LOW (ref 4.22–5.81)
RDW: 14.2 % (ref 11.5–15.5)
RDW: 14 % (ref 11.5–15.5)
WBC: 18.9 K/uL — ABNORMAL HIGH (ref 4.0–10.5)
WBC: 15.1 K/uL — ABNORMAL HIGH (ref 4.0–10.5)
nRBC: 0.1 % (ref 0.0–0.2)
nRBC: 0 % (ref 0.0–0.2)

## 2024-04-25 LAB — BLOOD GAS, ARTERIAL
Acid-base deficit: 3.1 mmol/L — ABNORMAL HIGH (ref 0.0–2.0)
Bicarbonate: 23.2 mmol/L (ref 20.0–28.0)
FIO2: 60 %
MECHVT: 500 mL
Mechanical Rate: 20
O2 Saturation: 99.7 %
PEEP: 5 cmH2O
Patient temperature: 37
pCO2 arterial: 45 mmHg (ref 32–48)
pH, Arterial: 7.32 — ABNORMAL LOW (ref 7.35–7.45)
pO2, Arterial: 140 mmHg — ABNORMAL HIGH (ref 83–108)

## 2024-04-25 LAB — COMPREHENSIVE METABOLIC PANEL WITH GFR
ALT: 163 U/L — ABNORMAL HIGH (ref 0–44)
AST: 290 U/L — ABNORMAL HIGH (ref 15–41)
Albumin: 2.8 g/dL — ABNORMAL LOW (ref 3.5–5.0)
Alkaline Phosphatase: 245 U/L — ABNORMAL HIGH (ref 38–126)
Anion gap: 22 — ABNORMAL HIGH (ref 5–15)
BUN: 49 mg/dL — ABNORMAL HIGH (ref 6–20)
CO2: 16 mmol/L — ABNORMAL LOW (ref 22–32)
Calcium: 9.1 mg/dL (ref 8.9–10.3)
Chloride: 91 mmol/L — ABNORMAL LOW (ref 98–111)
Creatinine, Ser: 8.7 mg/dL — ABNORMAL HIGH (ref 0.61–1.24)
GFR, Estimated: 6 mL/min — ABNORMAL LOW
Glucose, Bld: 175 mg/dL — ABNORMAL HIGH (ref 70–99)
Potassium: 4.6 mmol/L (ref 3.5–5.1)
Sodium: 129 mmol/L — ABNORMAL LOW (ref 135–145)
Total Bilirubin: 0.5 mg/dL (ref 0.0–1.2)
Total Protein: 6.8 g/dL (ref 6.5–8.1)

## 2024-04-25 LAB — CBG MONITORING, ED
Glucose-Capillary: 176 mg/dL — ABNORMAL HIGH (ref 70–99)
Glucose-Capillary: 244 mg/dL — ABNORMAL HIGH (ref 70–99)

## 2024-04-25 LAB — PROTIME-INR
INR: 1.6 — ABNORMAL HIGH (ref 0.8–1.2)
Prothrombin Time: 19.4 s — ABNORMAL HIGH (ref 11.4–15.2)

## 2024-04-25 LAB — APTT: aPTT: 26 s (ref 24–36)

## 2024-04-25 LAB — LACTIC ACID, PLASMA
Lactic Acid, Venous: 8.1 mmol/L (ref 0.5–1.9)
Lactic Acid, Venous: 5.2 mmol/L (ref 0.5–1.9)

## 2024-04-25 LAB — RESP PANEL BY RT-PCR (RSV, FLU A&B, COVID)  RVPGX2
Influenza A by PCR: NEGATIVE
Influenza B by PCR: NEGATIVE
Resp Syncytial Virus by PCR: NEGATIVE
SARS Coronavirus 2 by RT PCR: NEGATIVE

## 2024-04-25 LAB — PHOSPHORUS: Phosphorus: 6.4 mg/dL — ABNORMAL HIGH (ref 2.5–4.6)

## 2024-04-25 LAB — MAGNESIUM: Magnesium: 1.7 mg/dL (ref 1.7–2.4)

## 2024-04-25 LAB — TROPONIN T, HIGH SENSITIVITY: Troponin T High Sensitivity: 4922 ng/L (ref 0–19)

## 2024-04-25 MED ORDER — CHLORHEXIDINE GLUCONATE CLOTH 2 % EX PADS
6.0000 | MEDICATED_PAD | Freq: Every day | CUTANEOUS | Status: DC
  Administered 2024-04-25 – 2024-04-27 (×3): 6 via TOPICAL
  Filled 2024-04-25: qty 6

## 2024-04-25 MED ORDER — FENTANYL CITRATE (PF) 50 MCG/ML IJ SOSY
50.0000 ug | PREFILLED_SYRINGE | Freq: Once | INTRAMUSCULAR | Status: AC
  Administered 2024-04-25: 50 ug via INTRAVENOUS
  Filled 2024-04-25: qty 1

## 2024-04-25 MED ORDER — HEPARIN BOLUS VIA INFUSION
4000.0000 [IU] | Freq: Once | INTRAVENOUS | Status: AC
  Administered 2024-04-25: 4000 [IU] via INTRAVENOUS
  Filled 2024-04-25: qty 4000

## 2024-04-25 MED ORDER — ETOMIDATE 2 MG/ML IV SOLN
INTRAVENOUS | Status: AC
  Administered 2024-04-25: 20 mg via INTRAVENOUS
  Filled 2024-04-25: qty 10

## 2024-04-25 MED ORDER — PROPOFOL 1000 MG/100ML IV EMUL
0.0000 ug/kg/min | INTRAVENOUS | Status: DC
  Administered 2024-04-25: 5 ug/kg/min via INTRAVENOUS
  Administered 2024-04-26 (×2): 20 ug/kg/min via INTRAVENOUS
  Filled 2024-04-25 (×3): qty 100

## 2024-04-25 MED ORDER — IOHEXOL 300 MG/ML  SOLN
100.0000 mL | Freq: Once | INTRAMUSCULAR | Status: AC | PRN
  Administered 2024-04-25: 100 mL via INTRAVENOUS

## 2024-04-25 MED ORDER — POLYETHYLENE GLYCOL 3350 17 G PO PACK
17.0000 g | PACK | Freq: Every day | ORAL | Status: DC
  Administered 2024-04-26 – 2024-04-27 (×3): 17 g
  Filled 2024-04-25 (×3): qty 1

## 2024-04-25 MED ORDER — SENNA 8.6 MG PO TABS
1.0000 | ORAL_TABLET | Freq: Two times a day (BID) | ORAL | Status: DC
  Administered 2024-04-26 – 2024-04-27 (×5): 8.6 mg
  Filled 2024-04-25 (×5): qty 1

## 2024-04-25 MED ORDER — SODIUM CHLORIDE 0.9 % IV SOLN
2.0000 g | INTRAVENOUS | Status: DC
  Administered 2024-04-25 – 2024-04-27 (×3): 2 g via INTRAVENOUS
  Filled 2024-04-25 (×4): qty 20

## 2024-04-25 MED ORDER — ETOMIDATE 2 MG/ML IV SOLN: 20.0000 mg | Freq: Once | INTRAVENOUS | Status: AC

## 2024-04-25 MED ORDER — FENTANYL CITRATE (PF) 50 MCG/ML IJ SOSY: 50.0000 ug | PREFILLED_SYRINGE | INTRAMUSCULAR | Status: DC | PRN

## 2024-04-25 MED ORDER — POLYETHYLENE GLYCOL 3350 17 G PO PACK: 17.0000 g | PACK | Freq: Every day | ORAL | Status: DC | PRN

## 2024-04-25 MED ORDER — FENTANYL CITRATE (PF) 50 MCG/ML IJ SOSY
50.0000 ug | PREFILLED_SYRINGE | INTRAMUSCULAR | Status: DC | PRN
  Administered 2024-04-26 (×2): 50 ug via INTRAVENOUS
  Filled 2024-04-25 (×3): qty 1

## 2024-04-25 MED ORDER — SENNA 8.6 MG PO TABS: 1.0000 | ORAL_TABLET | Freq: Two times a day (BID) | ORAL | Status: DC | PRN

## 2024-04-25 MED ORDER — HEPARIN (PORCINE) 25000 UT/250ML-% IV SOLN
2000.0000 [IU]/h | INTRAVENOUS | Status: DC
  Administered 2024-04-25: 1250 [IU]/h via INTRAVENOUS
  Administered 2024-04-26: 1600 [IU]/h via INTRAVENOUS
  Administered 2024-04-27: 1800 [IU]/h via INTRAVENOUS
  Administered 2024-04-27: 1750 [IU]/h via INTRAVENOUS
  Administered 2024-04-28: 2000 [IU]/h via INTRAVENOUS
  Filled 2024-04-25 (×5): qty 250

## 2024-04-25 MED ORDER — NOREPINEPHRINE 4 MG/250ML-% IV SOLN
0.0000 ug/min | INTRAVENOUS | Status: DC
  Administered 2024-04-26: 1 ug/min via INTRAVENOUS
  Administered 2024-04-26: 2 ug/min via INTRAVENOUS
  Filled 2024-04-25 (×2): qty 250

## 2024-04-25 MED ORDER — ROCURONIUM BROMIDE 10 MG/ML (PF) SYRINGE: 100.0000 mg | PREFILLED_SYRINGE | Freq: Once | INTRAVENOUS | Status: AC

## 2024-04-25 MED ORDER — NOREPINEPHRINE 4 MG/250ML-% IV SOLN
INTRAVENOUS | Status: AC
  Administered 2024-04-25: 5 ug/min via INTRAVENOUS
  Filled 2024-04-25: qty 250

## 2024-04-25 MED ORDER — ROCURONIUM BROMIDE 10 MG/ML (PF) SYRINGE
PREFILLED_SYRINGE | INTRAVENOUS | Status: AC
  Administered 2024-04-25: 100 mg via INTRAVENOUS
  Filled 2024-04-25: qty 10

## 2024-04-25 MED ORDER — FAMOTIDINE 20 MG PO TABS
20.0000 mg | ORAL_TABLET | Freq: Two times a day (BID) | ORAL | Status: DC
  Administered 2024-04-26 (×2): 20 mg
  Filled 2024-04-25 (×2): qty 1

## 2024-04-25 MED ORDER — INSULIN ASPART 100 UNIT/ML IJ SOLN
0.0000 [IU] | INTRAMUSCULAR | Status: DC
  Administered 2024-04-25: 2 [IU] via SUBCUTANEOUS
  Administered 2024-04-26 (×4): 1 [IU] via SUBCUTANEOUS
  Administered 2024-04-26: 2 [IU] via SUBCUTANEOUS
  Administered 2024-04-26 (×2): 1 [IU] via SUBCUTANEOUS
  Administered 2024-04-27: 2 [IU] via SUBCUTANEOUS
  Administered 2024-04-27: 1 [IU] via SUBCUTANEOUS
  Administered 2024-04-27: 3 [IU] via SUBCUTANEOUS
  Administered 2024-04-27: 1 [IU] via SUBCUTANEOUS
  Administered 2024-04-28: 3 [IU] via SUBCUTANEOUS
  Filled 2024-04-25: qty 2
  Filled 2024-04-25: qty 1
  Filled 2024-04-25: qty 2
  Filled 2024-04-25 (×2): qty 1
  Filled 2024-04-25 (×2): qty 3
  Filled 2024-04-25 (×2): qty 1
  Filled 2024-04-25: qty 2

## 2024-04-25 NOTE — Progress Notes (Signed)
 PHARMACY CONSULT NOTE - ELECTROLYTES  Pharmacy Consult for Electrolyte Monitoring and Replacement   Recent Labs:   Estimated Creatinine Clearance: 11.3 mL/min (A) (by C-G formula based on SCr of 8.7 mg/dL (H)). Potassium (mmol/L)  Date Value  04/25/2024 4.6  12/29/2013 4.5   Magnesium  (mg/dL)  Date Value  95/75/7974 1.4 (L)   Calcium  (mg/dL)  Date Value  98/87/7973 9.1   Albumin  (g/dL)  Date Value  98/87/7973 2.8 (L)   Phosphorus (mg/dL)  Date Value  94/83/7974 7.2 (H)   Sodium (mmol/L)  Date Value  04/25/2024 129 (L)  02/14/2019 140   Corrected Ca: 10.1 mg/dL  Assessment  Chase Clark is a 59 y.o. male presenting with cardiac arrest. PMH significant for ESRD on dialysis. Pharmacy has been consulted to monitor and replace electrolytes.  Diet: NPO MIVF: none Pertinent medications: none  Goal of Therapy: Electrolytes WNL  Plan:  No supplementation needed at this time Check BMP, Mg, Phos with AM labs  Thank you for allowing pharmacy to be a part of this patient's care.  Lum VEAR Mania, PharmD Clinical Pharmacist 04/25/2024 5:42 PM

## 2024-04-25 NOTE — Progress Notes (Signed)
 RT assisted ICU provider with ET tube exchange with no complications.  Video laryngoscopy with a S4 blade was used.  O2 saturation and vital signs remained stable throughout.

## 2024-04-25 NOTE — Progress Notes (Addendum)
 RT assisted ED provider in intubation with 8.0 ETT using video laryngoscopy with an S4 blade.  No complications arose.  Patient's vital signs and O2 saturation remained stable throughout.

## 2024-04-25 NOTE — Progress Notes (Signed)
 eLink Physician-Brief Progress Note Patient Name: Chase Clark DOB: 1965-05-27 MRN: [id number]   Date of Service  04/25/2024  HPI/Events of Note  59 year old male with a history of ESRD, PVD, CAD status post CABG who presents to the emergency department with out-of-hospital cardiac arrest during dialysis session with asystole intubated and brought to the ICU for post resuscitative care.  Vitals, labs, and imaging were reviewed.  eICU Interventions  Maintain mechanical ventilation, SBT/SAT  Empiric antibiotics, follow cultures  Maintain normothermia, head of bed elevation, euglycemia  DVT prophylaxis with heparin  infusion GI prophylaxis with famotidine      Intervention Category Evaluation Type: New Patient Evaluation  Chere Babson 04/25/2024, 10:25 PM

## 2024-04-25 NOTE — Procedures (Signed)
 Endotracheal Intubation: Patient required placement of an artificial airway (reintubation) secondary to cuff leak    Consent: Emergent.    Hand washing performed prior to starting the procedure.    Medications administered for sedation prior to procedure:  20 mg Etomidate  IV, Rocuronium  100 mg IV     A time out procedure was called and correct patient, name, & ID confirmed. Needed supplies and equipment were assembled and checked to include ETT, 10 ml syringe, Glidescope, Mac and Miller blades, suction, oxygen and bag mask valve, end tidal CO2 monitor.    Patient was positioned to align the mouth and pharynx to facilitate visualization of the glottis.    Heart rate, SpO2 and blood pressure was continuously monitored during the procedure. Pre-oxygenation was conducted prior to intubation and endotracheal tube was placed through the vocal cords into the trachea.       The artificial airway was placed under direct visualization via glidescope route using a 8 mm ETT on the first attempt.   ETT was secured at 24 cm .   Placement was confirmed by auscuitation of lungs with good breath sounds bilaterally and no stomach sounds.  Condensation was noted on endotracheal tube.   Pulse ox 98% CO2 detector in place with appropriate color change.    Complications: None .        Chest radiograph ordered and pending.    Lonell Moose, AGNP  Pulmonary/Critical Care Pager [phone] (please enter 7 digits) PCCM Consult Pager [phone] (please enter 7 digits)

## 2024-04-25 NOTE — ED Notes (Signed)
 PT from dialysis with a witness cardiac arrest. Pt started dialysis at 11:30am and had been on until 12:20 when he arrested. Pt had 20 minutes of ACLS CPR with 3 doses of epi, sodium bicarb, and calcium . ROSC was obtained at 1250. HE received x1L NS at dialysis. Last vitals  70 168/141 99% BS 260 With with Igel in place  He is blind bilaterally. This was his first dialysis.

## 2024-04-25 NOTE — Progress Notes (Signed)
 PHARMACY - ANTICOAGULATION CONSULT NOTE  Pharmacy Consult for UFH infusion Indication: chest pain/ACS  Allergies[1]  Patient Measurements:    Vital Signs: Temp: 97.7 F (36.5 C) (01/12 1413) Temp Source: Rectal (01/12 1413) BP: 119/48 (01/12 1630) Pulse Rate: 73 (01/12 1630)  Labs: Recent Labs    04/25/24 1342  HGB 10.6*  HCT 33.7*  PLT 276  CREATININE 8.70*    Estimated Creatinine Clearance: 11.3 mL/min (A) (by C-G formula based on SCr of 8.7 mg/dL (H)).   Medical History: Past Medical History:  Diagnosis Date   Anemia    Arthritis    Asthma    as child   Atypical mole 09/22/2022   R spinal mid back, needs excision   Barrett's esophagus 02/16/2014   short segment   Cataract    Coronary artery disease    DDD (degenerative disc disease), cervical    Diabetes mellitus without complication (HCC)    Diabetic osteomyelitis (HCC)    Diabetic peripheral neuropathy (HCC)    Diabetic ulcer of foot with muscle involvement without evidence of necrosis (HCC)    DIABETIC ULCERATIONS ASSOCIATED WITH IRRITATION LATERAL ANKLE LEFT GREATER THAN RIGHT WITH MILD CELLULITIS    Diverticulosis    DKA (diabetic ketoacidoses) 08/23/2017   Edema, lower extremity    ESRD on hemodialysis (HCC)    Fatty liver    Gastroparesis 2015   GERD (gastroesophageal reflux disease)    Gilbert's disease    Glaucoma    Hx of BKA, left (HCC)    Hx of CABG 01/27/2019   2 vessels; LIMA-LAD, SVG-D1   Hx of heart artery stent 11/20/2009   80% D1 stenosis - 2.5 x 18 mm Xience V Everolimus (DES) stent x 1 placed   Hyperlipidemia    Hypertension    Left below-knee amputee (HCC)    Legally blind    Myocardial infarction (HCC) 11/2009   Neuropathy    Osteoarthritis of spine    Osteomyelitis of left foot (HCC)    Peritoneal dialysis catheter in place    Does dialysis over night.   PONV (postoperative nausea and vomiting)    PVD (peripheral vascular disease)    Seasonal allergies    Shoulder  pain    Tubular adenoma of colon    Ulcer of foot (HCC) 11/17/2012   DIABETIC ULCERATIONS ASSOCIATED WITH IRRITATION LATERAL ANKLE LEFT GREATER THAN RIGHT WITH MILD CELLULITIS    Medications:  (Not in a hospital admission)   Assessment: 59 y/o M presenting to the ED from HD with cardiac arrest. Pharmacy consulted to initiate heparin  infusion for ACS.   Goal of Therapy:  Heparin  level 0.3-0.7 units/ml Monitor platelets by anticoagulation protocol: Yes   Plan:  Give 4000 units bolus x 1 Start heparin  infusion at 1250 units/hr Check anti-Xa level in 8 hours and daily while on heparin  Continue to monitor H&H and platelets  Bari Hamilton D 04/25/2024,6:49 PM       [1]  Allergies Allergen Reactions   Baclofen Other (See Comments)    Disorientation, Contraindication with dialysis    Nsaids Other (See Comments)    CKD on dialysis

## 2024-04-25 NOTE — ED Triage Notes (Signed)
 Pt family at bedside

## 2024-04-25 NOTE — ED Notes (Signed)
 20mg  etomidate  given at this time 13:21 100mg  ROC given at this time 13:21  93/52 (63) 100% 60  Tube placed at 13:23 8.0 tube 24 at the lip + color change, bil breath sounds

## 2024-04-25 NOTE — H&P (Signed)
 "  NAME:  Chase Clark, MRN:  [id number], DOB:  December 15, 1965, LOS: 0 ADMISSION DATE:  04/25/2024, CONSULTATION DATE: 04/25/2024 REFERRING MD: Dr. Fernand, CHIEF COMPLAINT: Cardiac Arrest   History of Present Illness:  This is a 59 yo male who presented to [facility] ER on 01/12 from hemodialysis following a cardiac arrest.  Per ER notes pt cardiac arrested 45 minutes following initiation of first outpatient  hemodialysis session.  Per ED notes pt was in asystole and EMS administered a dose of bicarb and calcium  gluconate prior to ROSC.  Igel inserted by EMS.   ED Course  Upon arrival to the ER pt appeared to be spontaneously breathing with Igel in place.  Igel exchanged for ETT by EDP.  Significant lab results were Na+ 129/chloride 91/CO2 16/glucose 175/BUN 49/creatinine 8.70/anion gap 22/AST 290/ALT 163/troponin 4,922/lactic acid 8.1/wbc 18.9/hgb 10.6.  CXR and COVID/RSV/Influenza A&B negative.  CT Chest/Abd/Pelvis and CT Head results pending.  Pt requiring levophed  gtt to maintain map >65.  Pt admitted to ICU per PCCM team for additional workup and treatment.    Pertinent  Medical History   Past Medical History:  Diagnosis Date   Anemia    Arthritis    Asthma    as child   Atypical mole 09/22/2022   R spinal mid back, needs excision   Barrett's esophagus 02/16/2014   short segment   Cataract    Coronary artery disease    DDD (degenerative disc disease), cervical    Diabetes mellitus without complication (HCC)    Diabetic osteomyelitis (HCC)    Diabetic peripheral neuropathy (HCC)    Diabetic ulcer of foot with muscle involvement without evidence of necrosis (HCC)    DIABETIC ULCERATIONS ASSOCIATED WITH IRRITATION LATERAL ANKLE LEFT GREATER THAN RIGHT WITH MILD CELLULITIS    Diverticulosis    DKA (diabetic ketoacidoses) 08/23/2017   Edema, lower extremity    ESRD on hemodialysis (HCC)    Fatty liver    Gastroparesis 2015   GERD (gastroesophageal reflux disease)    Gilbert's disease     Glaucoma    Hx of BKA, left (HCC)    Hx of CABG 01/27/2019   2 vessels; LIMA-LAD, SVG-D1   Hx of heart artery stent 11/20/2009   80% D1 stenosis - 2.5 x 18 mm Xience V Everolimus (DES) stent x 1 placed   Hyperlipidemia    Hypertension    Left below-knee amputee (HCC)    Legally blind    Myocardial infarction (HCC) 11/2009   Neuropathy    Osteoarthritis of spine    Osteomyelitis of left foot (HCC)    Peritoneal dialysis catheter in place    Does dialysis over night.   PONV (postoperative nausea and vomiting)    PVD (peripheral vascular disease)    Seasonal allergies    Shoulder pain    Tubular adenoma of colon    Ulcer of foot (HCC) 11/17/2012   DIABETIC ULCERATIONS ASSOCIATED WITH IRRITATION LATERAL ANKLE LEFT GREATER THAN RIGHT WITH MILD CELLULITIS   Micro Data:    COVID/RSV/Flu A&B 01/12>>negative  Blood x2 01/12>> MRSA PCR 01/12>> Tracheal aspirate 01/12>> Blood x2 01/12>>   Anti-infectives (From admission, onward)    Start     Dose/Rate Route Frequency Ordered Stop   04/25/24 1900  cefTRIAXone  (ROCEPHIN ) 2 g in sodium chloride  0.9 % 100 mL IVPB        2 g 200 mL/hr over 30 Minutes Intravenous Every 24 hours 04/25/24 1847  Significant Hospital Events: Including procedures, antibiotic start and stop dates in addition to other pertinent events   01/12: Admitted to ICU post cardiac arrest during initial outpatient hemodialysis session requiring mechanical intubation   Interim History / Subjective:  Pt currently requiring levophed  gtt at 5 mcg/ min to maintain map >65   Objective    Blood pressure (!) 119/48, pulse 73, temperature 97.7 F (36.5 C), temperature source Rectal, resp. rate (!) 21, SpO2 100%.    Vent Mode: PRVC FiO2 (%):  [40 %-60 %] 40 % Set Rate:  [20 bmp] 20 bmp Vt Set:  [500 mL] 500 mL PEEP:  [5 cmH20] 5 cmH20  No intake or output data in the 24 hours ending 04/25/24 1810 There were no vitals filed for this  visit.  Examination: General: Acute on chronically-ill male resting in bed, NAD mechanically intubated HENT: Supple, difficult to assess for JVD due to body habitus  Lungs: Rhonchi throughout, even, non labored  Cardiovascular: Sinus rhythm, no r/g, 2+ radial/1+ distal pulses, no edema  Abdomen: +BS x4, obese, soft, non distended, PD catheter in place  Extremities: Left BKA  Neuro: Sedated, not following commands, withdraws from painful stimulation, PERRL GU: Deferred   Resolved problem list   Assessment and Plan   #Acute metabolic encephalopathy  #Mechanical ventilation pain/discomfort  Hx: Legally blind  CT Head 01/12: No acute intracranial process. Progressive chronic sinus disease. - Correct metabolic derangements  - Avoid sedating medications as able  - Maintain RASS goal 0 to -1 - PAD protocol to maintain RASS goal: propofol  gtt  - WUA daily  - Promote family presence at bedside   #Cardiac arrest (asystole) suspect secondary to hemodialysis  #Cardiogenic shock  #Elevated troponin suspect NSTEMI  #Severe lactic acidosis  Hx: CAD, HLD, heart murmur, MI, and PVD  - Continuous telemetry monitoring  - Trend troponin's until peaked  - Trend lactic acid until normalized  - Prn levophed  gtt to maintain map >65 - Heparin  gtt  - Will consult Cardiology in the am  - Echo pending   #Acute respiratory failure post cardiac arrest  #Mechanical ventilation  - Full vent support for now: vent settings reviewed and established  - Maintain plateau pressures less than 30 cm H2O - Continue lung protective strategies  - SBT once all parameters met  - Intermittent CXR and ABG's - CT Chest results pending   #ESRD on hemodialysis  #Hyponatremia  - Trend BMP  - Replace electrolytes as indicated  - Strict I&O's - Nephrology consulted appreciate input: dialysis per recommendations   #Transaminitis  - Trend hepatic function panel  - Avoid hepatotoxic agents as able   #Leukocytosis   #Elevated alk phos concerning for possible intra-abdominal infection (PD catheter in place) - Trend WBC and monitor fever curve  - Follow cultures  - Continue empiric ceftriaxone  for now pending culture results and sensitivities  - CT Abd/Pelvis pending   #Type II diabetes mellitus  - CBG's q4hrs  - SSI  - Target CBG readings 140 to 180 - Follow hypo/hyperglycemic protocol   01/12: Pts family updated extensively by Dr. Isaiah regarding pts condition and current plan of care.  All questions answered  Labs   CBC: Recent Labs  Lab 04/25/24 1342  WBC 18.9*  HGB 10.6*  HCT 33.7*  MCV 112.3*  PLT 276    Basic Metabolic Panel: Recent Labs  Lab 04/25/24 1342  NA 129*  K 4.6  CL 91*  CO2 16*  GLUCOSE 175*  BUN 49*  CREATININE 8.70*  CALCIUM  9.1   GFR: Estimated Creatinine Clearance: 11.3 mL/min (A) (by C-G formula based on SCr of 8.7 mg/dL (H)). Recent Labs  Lab 04/25/24 1342  WBC 18.9*  LATICACIDVEN 8.1*    Liver Function Tests: Recent Labs  Lab 04/25/24 1342  AST 290*  ALT 163*  ALKPHOS 245*  BILITOT 0.5  PROT 6.8  ALBUMIN  2.8*   No results for input(s): LIPASE, AMYLASE in the last 168 hours. No results for input(s): AMMONIA in the last 168 hours.  ABG    Component Value Date/Time   PHART 7.32 (L) 04/25/2024 1430   PCO2ART 45 04/25/2024 1430   PO2ART 140 (H) 04/25/2024 1430   HCO3 23.2 04/25/2024 1430   TCO2 27 07/26/2023 1842   ACIDBASEDEF 3.1 (H) 04/25/2024 1430   O2SAT 99.7 04/25/2024 1430     Coagulation Profile: No results for input(s): INR, PROTIME in the last 168 hours.  Cardiac Enzymes: No results for input(s): CKTOTAL, CKMB, CKMBINDEX, TROPONINI in the last 168 hours.  HbA1C: Hemoglobin A1C  Date/Time Value Ref Range Status  09/24/2022 10:15 AM 6.8 (A) 4.0 - 5.6 % Final  12/06/2021 08:31 AM 6.9 (A) 4.0 - 5.6 % Final   Hgb A1c MFr Bld  Date/Time Value Ref Range Status  07/26/2023 08:25 PM 4.9 4.8 - 5.6 % Final     Comment:    (NOTE) Pre diabetes:          5.7%-6.4%  Diabetes:              >6.4%  Glycemic control for   <7.0% adults with diabetes   06/05/2020 08:38 AM 7.0 (H) 4.6 - 6.5 % Final    Comment:    Glycemic Control Guidelines for People with Diabetes:Non Diabetic:  <6%Goal of Therapy: <7%Additional Action Suggested:  >8%     CBG: Recent Labs  Lab 04/21/24 1342 04/25/24 1340  GLUCAP 128* 176*    Review of Systems:   Unable to assess pt mechanically intubated   Past Medical History:  He,  has a past medical history of Anemia, Arthritis, Asthma, Atypical mole (09/22/2022), Barrett's esophagus (02/16/2014), Cataract, Coronary artery disease, DDD (degenerative disc disease), cervical, Diabetes mellitus without complication (HCC), Diabetic osteomyelitis (HCC), Diabetic peripheral neuropathy (HCC), Diabetic ulcer of foot with muscle involvement without evidence of necrosis (HCC), Diverticulosis, DKA (diabetic ketoacidoses) (08/23/2017), Edema, lower extremity, ESRD on hemodialysis (HCC), Fatty liver, Gastroparesis (2015), GERD (gastroesophageal reflux disease), Gilbert's disease, Glaucoma, BKA, left (HCC), CABG (01/27/2019), heart artery stent (11/20/2009), Hyperlipidemia, Hypertension, Left below-knee amputee (HCC), Legally blind, Myocardial infarction (HCC) (11/2009), Neuropathy, Osteoarthritis of spine, Osteomyelitis of left foot (HCC), Peritoneal dialysis catheter in place, PONV (postoperative nausea and vomiting), PVD (peripheral vascular disease), Seasonal allergies, Shoulder pain, Tubular adenoma of colon, and Ulcer of foot (HCC) (11/17/2012).   Surgical History:   Past Surgical History:  Procedure Laterality Date   ABDOMINAL AORTOGRAM N/A 05/20/2016   Procedure: Abdominal Aortogram possible intervention;  Surgeon: Cordella KANDICE Shawl, MD;  Location: [facility];  Service: Cardiovascular;  Laterality: N/A;   AMPUTATION Left 09/05/2017   Procedure: AMPUTATION BELOW KNEE;   Surgeon: Dozier Soulier, MD;  Location: [facility] OR;  Service: Orthopedics;  Laterality: Left;   ANTERIOR CERVICAL DECOMP/DISCECTOMY FUSION N/A 05/07/2017   Procedure: ANTERIOR CERVICAL DECOMPRESSION/DISCECTOMY CORTEZ REDHEAD PROSTHESIS,PLATE/SCREWS CERVICAL FIVE - CERVICAL SIX;  Surgeon: Mavis Purchase, MD;  Location: [facility] OR;  Service: Neurosurgery;  Laterality: N/A;   CAPD INSERTION N/A 08/30/2020   Procedure:  LAPAROSCOPIC INSERTION CONTINUOUS AMBULATORY PERITONEAL DIALYSIS  (CAPD) CATHETER;  Surgeon: Desiderio Schanz, MD;  Location: [facility] ORS;  Service: General;  Laterality: N/A;   CATARACT EXTRACTION W/ INTRAOCULAR LENS IMPLANT     CATARACT EXTRACTION W/PHACO Left 02/26/2017   Procedure: CATARACT EXTRACTION PHACO AND INTRAOCULAR LENS PLACEMENT (IOC);  Surgeon: Myrna Adine Anes, MD;  Location: [facility] ORS;  Service: Ophthalmology;  Laterality: Left;  Lot # [id number] H US : 00:25.3 AP%: 6.2 CDE: 1.59    CHOLECYSTECTOMY N/A    COLONOSCOPY WITH PROPOFOL  N/A 05/30/2021   Procedure: COLONOSCOPY WITH PROPOFOL ;  Surgeon: Albertus Gordy HERO, MD;  Location: [facility];  Service: Gastroenterology;  Laterality: N/A;   CORONARY ANGIOPLASTY WITH STENT PLACEMENT  11/20/2009   80% D1 stenosis - 2.5 x 18 mm Xience V Everolimus (DES) stent x 1 placed; LOCATION: [facility]; SURGEON: Vinie Jude, MD   CORONARY ARTERY BYPASS GRAFT N/A 01/27/2019   Procedure: OFF PUMP CORONARY ARTERY BYPASS GRAFTING (CABG) X 2 WITH ENDOSCOPIC HARVESTING OF RIGHT GREATER SAPHENOUS VEIN. LIMA TO LAD;  Surgeon: Shyrl Linnie KIDD, MD;  Location: [facility] OR;  Service: Open Heart Surgery;  Laterality: N/A;   CORONARY STENT INTERVENTION N/A 01/21/2019   Procedure: CORONARY STENT INTERVENTION;  Surgeon: Claudene Victory ORN, MD;  Location: [facility];  Service: Cardiovascular;  Laterality: N/A;   DIALYSIS/PERMA CATHETER INSERTION N/A 04/21/2024   Procedure: DIALYSIS/PERMA CATHETER INSERTION;  Surgeon: Marea Selinda RAMAN, MD;  Location: [facility];   Service: Cardiovascular;  Laterality: N/A;   EPIBLEPHERON REPAIR WITH TEAR DUCT PROBING     EYE SURGERY Right 02/09/2014   cataract extraction   INSERTION EXPRESS TUBE SHUNT Right 09/06/2015   Procedure: INSERTION AHMED TUBE SHUNT with tutoplast allograft;  Surgeon: Adine Anes Myrna, MD;  Location: [facility] ORS;  Service: Ophthalmology;  Laterality: Right;   laparoscopic insertion continuous peritoneal dialysis  2022   LEFT HEART CATH AND CORONARY ANGIOGRAPHY N/A 01/20/2019   Procedure: LEFT HEART CATH AND CORONARY ANGIOGRAPHY;  Surgeon: Court Dorn PARAS, MD;  Location: [facility];  Service: Cardiovascular;  Laterality: N/A;   POLYPECTOMY  05/30/2021   Procedure: POLYPECTOMY;  Surgeon: Albertus Gordy HERO, MD;  Location: [facility];  Service: Gastroenterology;;   TEE WITHOUT CARDIOVERSION N/A 01/27/2019   Procedure: TRANSESOPHAGEAL ECHOCARDIOGRAM (TEE);  Surgeon: Shyrl Linnie KIDD, MD;  Location: [facility] OR;  Service: Open Heart Surgery;  Laterality: N/A;   VASECTOMY       Social History:   reports that he quit smoking about 16 years ago. His smoking use included cigarettes. He started smoking about 36 years ago. He has a 10 pack-year smoking history. He has never used smokeless tobacco. He reports that he does not currently use alcohol . He reports that he does not use drugs.   Family History:  His family history includes Diabetes in his father and mother; Heart attack in his father; Hemochromatosis in an other family member; Hyperlipidemia in his father and mother; Hypertension in his father; Liver disease in his mother; Lymphoma in his father; Other in his mother; Polycythemia in an other family member. There is no history of Colon cancer, Esophageal cancer, Rectal cancer, or Stomach cancer.   Allergies Allergies[1]   Home Medications  Prior to Admission medications  Medication Sig Start Date End Date Taking? Authorizing Provider  acetaminophen  (TYLENOL ) 325 MG tablet Take 1 tablet (325  mg total) by mouth every 6 (six) hours as needed for mild pain (pain score 1-3 or temp > 100.5). 09/06/17  Yes Laliberte, Danielle, PA-C  amLODipine  (NORVASC ) 10 MG tablet Take 0.5 tablets (5 mg total) by mouth daily. 08/30/23  Yes Regalado, Belkys A, MD  ammonium lactate  (AMLACTIN) 12 % cream Apply topically to feet, toes and thickened areas of skin at arms and legs bid 11/19/23  Yes Jackquline Sawyer, MD  aspirin  (ASPIRIN  CHILDRENS) 81 MG chewable tablet Chew 1 tablet (81 mg total) by mouth daily. 03/04/19  Yes Lightfoot, Linnie KIDD, MD  brimonidine -timolol  (COMBIGAN ) 0.2-0.5 % ophthalmic solution Place 1 drop into both eyes in the morning and at bedtime.   Yes [provider]  Cholecalciferol  (VITAMIN D3) 1000 units CAPS Take 1,000 Units by mouth in the morning.   Yes [provider]  clobetasol  cream (TEMOVATE ) 0.05 % Apply topically to affected area of thickness at arms and legs qd/bid prn Avoid applying to face, groin, and axilla. Use as directed. Use up to 4 weeks 11/19/23  Yes Jackquline Sawyer, MD  dorzolamide  (TRUSOPT ) 2 % ophthalmic solution Place 1 drop into both eyes 2 (two) times daily. 10/18/19  Yes [provider]  ezetimibe  (ZETIA ) 10 MG tablet TAKE 1 TABLET BY MOUTH EVERYDAY AT BEDTIME 11/02/23  Yes Vincente Saber, NP  FLOMAX  0.4 MG CAPS capsule Take 0.4 mg by mouth at bedtime. 04/03/21  Yes [provider]  fluticasone  (FLONASE ) 50 MCG/ACT nasal spray SPRAY 2 SPRAYS INTO EACH NOSTRIL EVERY DAY 11/14/19  Yes Maribeth Camellia MATSU, MD  gabapentin  (NEURONTIN ) 300 MG capsule Take 1 capsule (300 mg total) by mouth 2 (two) times daily. 07/30/23  Yes Pokhrel, Laxman, MD  gentamicin  cream (GARAMYCIN ) 0.1 % Apply 1 application  topically See admin instructions. Apply around port site as directed 04/05/21  Yes [provider]  lactulose  (CHRONULAC ) 10 GM/15ML solution TAKE 30 MLS (20 G TOTAL) BY MOUTH AS NEEDED. 11/02/20  Yes Pyrtle, Gordy HERO, MD  latanoprost  (XALATAN )  0.005 % ophthalmic solution Place 1 drop into both eyes in the morning.   Yes [provider]  loratadine  (CLARITIN ) 10 MG tablet TAKE 1 TABLET BY MOUTH EVERY DAY 04/18/24  Yes Kaur, Charanpreet, NP  metoCLOPramide  (REGLAN ) 10 MG tablet TAKE 1 TABLET (10 MG TOTAL) BY MOUTH AT BEDTIME. PLEASE KEEP YOUR DECEMBER APPOINTMENT FOR FURTHER REFILLS. 04/22/24  Yes McMichael, Bayley M, PA-C  midodrine  (PROAMATINE ) 5 MG tablet Take 5 mg by mouth 2 (two) times daily as needed (if the Systolic number is less than 100). 09/27/20  Yes [provider]  mometasone  (ELOCON ) 0.1 % cream Apply 1 Application topically as directed. Qd to bid to aa right ankle, right lower leg until improved, then prn flares Patient taking differently: Apply 1 Application topically See admin instructions. Apply to affected area of the ankle and lower leg 1-2 times a day until improvement is seen, then as needed for flares 03/30/23  Yes Stewart, Tara, MD  Multiple Vitamin (MULTIVITAMIN ADULT PO) Take 1 tablet by mouth daily.   Yes [provider]  ondansetron  (ZOFRAN -ODT) 4 MG disintegrating tablet Take 4 mg by mouth every 8 (eight) hours as needed for nausea or vomiting (dissolve orally).   Yes [provider]  pantoprazole  (PROTONIX ) 40 MG tablet TAKE 1 TABLET BY MOUTH TWICE A DAY 10/09/23  Yes Kaur, Charanpreet, NP  PARoxetine (PAXIL) 10 MG tablet Take 10 mg by mouth every morning.   Yes [provider]  Potassium Chloride  CR (MICRO-K ) 8 MEQ CPCR capsule CR Take 1 capsule by mouth 2 (two) times daily. 03/11/24  Yes  [provider]  torsemide  (DEMADEX ) 20 MG tablet Take 40 mg by mouth 2 (two) times daily. At breakfast and lunchtime   Yes [provider]  Vitamin D , Ergocalciferol , (DRISDOL) 1.25 MG (50000 UNIT) CAPS capsule Take 50,000 Units by mouth every 14 (fourteen) days.   Yes [provider]  atorvastatin  (LIPITOR ) 80 MG tablet Take 1 tablet (80 mg total) by mouth at  bedtime. Patient not taking: Reported on 04/21/2024 08/06/23   Kaur, Charanpreet, NP  azelastine  (ASTELIN ) 0.1 % nasal spray Place 2 sprays into both nostrils 2 (two) times daily. Use in each nostril as directed Patient not taking: Reported on 04/25/2024 08/30/23   Regalado, Owen A, MD  Continuous Glucose Sensor (DEXCOM G7 SENSOR) MISC Change sensor every 10 days 09/08/23   Shamleffer, Ibtehal Jaralla, MD  Efinaconazole  (JUBLIA ) 10 % SOLN Apply topically to affected toenails at night for onychomycosis Patient not taking: Reported on 04/25/2024 11/19/23   Jackquline Sawyer, MD  guaiFENesin  (MUCINEX ) 600 MG 12 hr tablet Take 1 tablet (600 mg total) by mouth 2 (two) times daily as needed for to loosen phlegm. Patient not taking: Reported on 04/21/2024 08/30/23   Regalado, Belkys A, MD  insulin  degludec (TRESIBA  FLEXTOUCH) 200 UNIT/ML FlexTouch Pen INJECT 42 UNITS INTO THE SKIN IN THE MORNING AND AT BEDTIME. 08/31/23   Shamleffer, Ibtehal Jaralla, MD  insulin  lispro (HUMALOG ) 100 UNIT/ML KwikPen MAX DAILY 45 UNITS 09/28/23   Shamleffer, Ibtehal Jaralla, MD  Insulin  NPH, Human,, Isophane, (HUMULIN  N KWIKPEN) 100 UNIT/ML Kiwkpen INJECT 70 UNITS INTO THE SKIN DAILY IN THE AFTERNOON. TO BE TAKEN AT THE START OF DIALYSIS 01/04/24   Shamleffer, Donell Cardinal, MD  KLOR-CON  M20 20 MEQ tablet Take 20 mEq by mouth in the morning. Patient not taking: Reported on 04/25/2024    [provider]  senna (SENOKOT) 8.6 MG tablet Take 2 tablets by mouth in the morning. Patient not taking: Reported on 04/25/2024    [provider]  sevelamer  carbonate (RENVELA ) 800 MG tablet Take 1 tablet (800 mg total) by mouth 3 (three) times daily with meals. Patient not taking: Reported on 04/21/2024 08/30/23   Regalado, Belkys A, MD  terbinafine  (LAMISIL ) 250 MG tablet Take 1 tablet daily Patient not taking: Reported on 04/21/2024 11/23/23   Jackquline Sawyer, MD  Testosterone  20.25 MG/ACT (1.62%) GEL Place 2 Pump onto the skin daily in the  afternoon. Patient not taking: Reported on 04/25/2024 05/15/23   Shamleffer, Donell Cardinal, MD     Critical care time: 70 minutes      Lonell Moose, AGNP  Pulmonary/Critical Care Pager [phone] (please enter 7 digits) PCCM Consult Pager [phone] (please enter 7 digits)          [1]  Allergies Allergen Reactions   Baclofen Other (See Comments)    Disorientation, Contraindication with dialysis    Nsaids Other (See Comments)    CKD on dialysis    "

## 2024-04-25 NOTE — ED Provider Notes (Signed)
 "  [facility] Provider Note    Event Date/Time   First MD Initiated Contact with Patient 04/25/24 1328     (approximate)   History   No chief complaint on file.   HPI  Chase Clark is a 59 y.o. male presenting with concern of cardiac arrest.  History of end-stage renal disease, he gets peritoneal dialysis, unfortunately was not successful, he was getting dialysis done today and after about 45 minutes of the dialysis he had a cardiac arrest.  Was in asystole throughout the whole process, EMS eventually provided a dose of bicarb and calcium  gluconate, and patient had ROSC afterwards.  Upon arrival here he has a Igel in place, but seems to be breathing spontaneously.  Family arrived at bedside providing further history he does have an extensive cardiac disease history, had not noticed any complaints of chest pain recently.     Physical Exam   Triage Vital Signs: ED Triage Vitals [04/25/24 1319]  Encounter Vitals Group     BP (!) 93/52     Girls Systolic BP Percentile      Girls Diastolic BP Percentile      Boys Systolic BP Percentile      Boys Diastolic BP Percentile      Pulse Rate (!) 55     Resp (!) 21     Temp      Temp src      SpO2 97 %     Weight      Height      Head Circumference      Peak Flow      Pain Score      Pain Loc      Pain Education      Exclude from Growth Chart     Most recent vital signs: Vitals:   04/25/24 1413 04/25/24 1420  BP:    Pulse: 80 79  Resp: 20 20  Temp: 97.7 F (36.5 C)   SpO2: 100% 100%     General: Obtunded unresponsive CV:  Good peripheral perfusion.  Sinus rhythm on telemetry Resp:  Normal effort.  Spontaneously breathing Abd:  No distention.  Soft Other:     ED Results / Procedures / Treatments   Labs (all labs ordered are listed, but only abnormal results are displayed) Labs Reviewed  CBC - Abnormal; Notable for the following components:      Result Value   WBC 18.9 (*)    RBC 3.00  (*)    Hemoglobin 10.6 (*)    HCT 33.7 (*)    MCV 112.3 (*)    MCH 35.3 (*)    All other components within normal limits  COMPREHENSIVE METABOLIC PANEL WITH GFR - Abnormal; Notable for the following components:   Sodium 129 (*)    Chloride 91 (*)    CO2 16 (*)    Glucose, Bld 175 (*)    BUN 49 (*)    Creatinine, Ser 8.70 (*)    Albumin  2.8 (*)    AST 290 (*)    ALT 163 (*)    Alkaline Phosphatase 245 (*)    GFR, Estimated 6 (*)    Anion gap 22 (*)    All other components within normal limits  LACTIC ACID, PLASMA - Abnormal; Notable for the following components:   Lactic Acid, Venous 8.1 (*)    All other components within normal limits  BLOOD GAS, ARTERIAL - Abnormal; Notable for the following components:  pH, Arterial 7.32 (*)    pO2, Arterial 140 (*)    Acid-base deficit 3.1 (*)    All other components within normal limits  CBG MONITORING, ED - Abnormal; Notable for the following components:   Glucose-Capillary 176 (*)    All other components within normal limits  TROPONIN T, HIGH SENSITIVITY - Abnormal; Notable for the following components:   Troponin T High Sensitivity 4,922 (*)    All other components within normal limits  RESP PANEL BY RT-PCR (RSV, FLU A&B, COVID)  [id number]     EKG  On my independent interpretation of this EKG appears to be a sinus rhythm with rate of about 80, axis of about 30, intervals appear to be within normal limits, there is ST depression noted in leads V2, V3 and V4, I do not appreciate any significant reciprocal ST elevation   RADIOLOGY Endotracheal tube appears to be in appropriate location, no acute cardiopulmonary processes appreciated  PROCEDURES:  Critical Care performed: Yes, see critical care procedure note(s)  CRITICAL CARE Performed by: Rossie CHRISTELLA Bathe   Total critical care time: 45 minutes  Critical care time was exclusive of separately billable procedures and treating other patients.  Critical care was necessary to  treat or prevent imminent or life-threatening deterioration.  Critical care was time spent personally by me on the following activities: development of treatment plan with patient and/or surrogate as well as nursing, discussions with consultants, evaluation of patient's response to treatment, examination of patient, obtaining history from patient or surrogate, ordering and performing treatments and interventions, ordering and review of laboratory studies, ordering and review of radiographic studies, pulse oximetry and re-evaluation of patient's condition.   Procedure Name: Intubation Date/Time: 04/25/2024 3:04 PM  Performed by: Bathe Rossie CHRISTELLA, MDPre-anesthesia Checklist: Patient identified, Emergency Drugs available, Suction available, Patient being monitored and Timeout performed Oxygen Delivery Method: Ambu bag Preoxygenation: Pre-oxygenation with 100% oxygen Induction Type: Rapid sequence Ventilation: Mask ventilation without difficulty Laryngoscope Size: 4 and Glidescope Grade View: Grade IV Tube size: 8.0 mm Number of attempts: 1 Airway Equipment and Method: Stylet, Video-laryngoscopy and Oral airway Placement Confirmation: ETT inserted through vocal cords under direct vision, Positive ETCO2, CO2 detector and Breath sounds checked- equal and bilateral Secured at: 24 cm       MEDICATIONS ORDERED IN ED: Medications  norepinephrine  (LEVOPHED ) 4mg  in (0.016 mg/mL) premix infusion (7.5 mcg/min Intravenous Rate/Dose Change 04/25/24 1507)  propofol  (DIPRIVAN ) 1000 MG/100ML infusion (5 mcg/kg/min  112.1 kg Intravenous New Bag/Given 04/25/24 1351)  etomidate  (AMIDATE ) injection 20 mg (20 mg Intravenous Given 04/25/24 1321)  rocuronium  (ZEMURON ) injection 100 mg (100 mg Intravenous Given 04/25/24 1321)  fentaNYL  (SUBLIMAZE ) injection 50 mcg (50 mcg Intravenous Given 04/25/24 1345)     IMPRESSION / MDM / ASSESSMENT AND PLAN / ED COURSE  I reviewed the triage vital signs and the  nursing notes.                               Patient's presentation is most consistent with acute presentation with potential threat to life or bodily function.  59 year old male who presents with a resuscitated arrest.  About 20 minutes CPR prior to arrival here.  Given presentation concern of possibly hyperkalemia resulting in the arrest versus possibly acute MI but she given his prior history.  Following up blood work now, we will go ahead and place for endotracheal intubation, and reach out to cardiology for discussion  in regards to admission.   Clinical Course as of 04/25/24 1511  Mon Apr 25, 2024  1442 Spoke with Dr. Isaiah from ICU, plan to follow up CT imaging of the head and will discuss further management accordingly. [SK]  1510 Significant lactic acidosis as well as significant troponin elevation, possibly secondary to his recent arrest versus possibly permanent MI.  He does have diffuse ST depressions, but no clear ST elevation.  Regardless plan for admission to ICU, awaiting CT imaging at this time. [SK]    Clinical Course User Index [SK] Fernand Rossie HERO, MD     FINAL CLINICAL IMPRESSION(S) / ED DIAGNOSES   Final diagnoses:  Cardiac arrest (HCC)  ESRD (end stage renal disease) (HCC)  NSTEMI (non-ST elevated myocardial infarction) (HCC)     Rx / DC Orders   ED Discharge Orders     None        Note:  This document was prepared using Dragon voice recognition software and may include unintentional dictation errors.   Fernand Rossie HERO, MD 04/25/24 1511  "

## 2024-04-25 NOTE — ED Notes (Signed)
 ET tube change  8.0 tube 24 at the teeth

## 2024-04-25 NOTE — ED Triage Notes (Signed)
 Critical  Lactic acid 8.1 Troponin 4,922  MD Fernand made aware.

## 2024-04-25 NOTE — ED Notes (Signed)
 Contacted lab to come and draw blood cultures. Unable to obtain.

## 2024-04-26 ENCOUNTER — Encounter (INDEPENDENT_AMBULATORY_CARE_PROVIDER_SITE_OTHER): Admitting: Vascular Surgery

## 2024-04-26 ENCOUNTER — Encounter (INDEPENDENT_AMBULATORY_CARE_PROVIDER_SITE_OTHER)

## 2024-04-26 ENCOUNTER — Other Ambulatory Visit (INDEPENDENT_AMBULATORY_CARE_PROVIDER_SITE_OTHER)

## 2024-04-26 ENCOUNTER — Inpatient Hospital Stay

## 2024-04-26 DIAGNOSIS — R570 Cardiogenic shock: Secondary | ICD-10-CM | POA: Diagnosis not present

## 2024-04-26 DIAGNOSIS — I959 Hypotension, unspecified: Secondary | ICD-10-CM | POA: Diagnosis not present

## 2024-04-26 DIAGNOSIS — G9341 Metabolic encephalopathy: Secondary | ICD-10-CM | POA: Diagnosis not present

## 2024-04-26 DIAGNOSIS — J9602 Acute respiratory failure with hypercapnia: Secondary | ICD-10-CM | POA: Diagnosis not present

## 2024-04-26 DIAGNOSIS — J9601 Acute respiratory failure with hypoxia: Secondary | ICD-10-CM | POA: Diagnosis not present

## 2024-04-26 DIAGNOSIS — Z992 Dependence on renal dialysis: Secondary | ICD-10-CM | POA: Diagnosis not present

## 2024-04-26 DIAGNOSIS — G4089 Other seizures: Secondary | ICD-10-CM | POA: Diagnosis not present

## 2024-04-26 DIAGNOSIS — I255 Ischemic cardiomyopathy: Secondary | ICD-10-CM | POA: Diagnosis not present

## 2024-04-26 DIAGNOSIS — I214 Non-ST elevation (NSTEMI) myocardial infarction: Secondary | ICD-10-CM | POA: Diagnosis not present

## 2024-04-26 DIAGNOSIS — N186 End stage renal disease: Secondary | ICD-10-CM | POA: Diagnosis not present

## 2024-04-26 DIAGNOSIS — I469 Cardiac arrest, cause unspecified: Secondary | ICD-10-CM | POA: Diagnosis not present

## 2024-04-26 LAB — HEPATIC FUNCTION PANEL
ALT: 109 U/L — ABNORMAL HIGH (ref 0–44)
AST: 113 U/L — ABNORMAL HIGH (ref 15–41)
Albumin: 2.8 g/dL — ABNORMAL LOW (ref 3.5–5.0)
Alkaline Phosphatase: 223 U/L — ABNORMAL HIGH (ref 38–126)
Bilirubin, Direct: 0.1 mg/dL (ref 0.0–0.2)
Indirect Bilirubin: 0.2 mg/dL — ABNORMAL LOW (ref 0.3–0.9)
Total Bilirubin: 0.3 mg/dL (ref 0.0–1.2)
Total Protein: 6.5 g/dL (ref 6.5–8.1)

## 2024-04-26 LAB — CBC
HCT: 29.2 % — ABNORMAL LOW (ref 39.0–52.0)
Hemoglobin: 9.6 g/dL — ABNORMAL LOW (ref 13.0–17.0)
MCH: 35.3 pg — ABNORMAL HIGH (ref 26.0–34.0)
MCHC: 32.9 g/dL (ref 30.0–36.0)
MCV: 107.4 fL — ABNORMAL HIGH (ref 80.0–100.0)
Platelets: 245 K/uL (ref 150–400)
RBC: 2.72 MIL/uL — ABNORMAL LOW (ref 4.22–5.81)
RDW: 13.9 % (ref 11.5–15.5)
WBC: 12.6 K/uL — ABNORMAL HIGH (ref 4.0–10.5)
nRBC: 0 % (ref 0.0–0.2)

## 2024-04-26 LAB — BASIC METABOLIC PANEL WITH GFR
Anion gap: 16 — ABNORMAL HIGH (ref 5–15)
BUN: 57 mg/dL — ABNORMAL HIGH (ref 6–20)
CO2: 23 mmol/L (ref 22–32)
Calcium: 8.8 mg/dL — ABNORMAL LOW (ref 8.9–10.3)
Chloride: 87 mmol/L — ABNORMAL LOW (ref 98–111)
Creatinine, Ser: 9.51 mg/dL — ABNORMAL HIGH (ref 0.61–1.24)
GFR, Estimated: 6 mL/min — ABNORMAL LOW
Glucose, Bld: 191 mg/dL — ABNORMAL HIGH (ref 70–99)
Potassium: 4.1 mmol/L (ref 3.5–5.1)
Sodium: 126 mmol/L — ABNORMAL LOW (ref 135–145)

## 2024-04-26 LAB — GLUCOSE, CAPILLARY
Glucose-Capillary: 169 mg/dL — ABNORMAL HIGH (ref 70–99)
Glucose-Capillary: 170 mg/dL — ABNORMAL HIGH (ref 70–99)
Glucose-Capillary: 172 mg/dL — ABNORMAL HIGH (ref 70–99)
Glucose-Capillary: 176 mg/dL — ABNORMAL HIGH (ref 70–99)
Glucose-Capillary: 177 mg/dL — ABNORMAL HIGH (ref 70–99)
Glucose-Capillary: 185 mg/dL — ABNORMAL HIGH (ref 70–99)
Glucose-Capillary: 211 mg/dL — ABNORMAL HIGH (ref 70–99)
Glucose-Capillary: 219 mg/dL — ABNORMAL HIGH (ref 70–99)

## 2024-04-26 LAB — LACTIC ACID, PLASMA
Lactic Acid, Venous: 2.3 mmol/L (ref 0.5–1.9)
Lactic Acid, Venous: 1.8 mmol/L (ref 0.5–1.9)

## 2024-04-26 LAB — ECHOCARDIOGRAM COMPLETE
Area-P 1/2: 4.13 cm2
Height: 70 in
S' Lateral: 4.4 cm
Weight: 3953.61 [oz_av]

## 2024-04-26 LAB — TROPONIN T, HIGH SENSITIVITY
Troponin T High Sensitivity: 5773 ng/L (ref 0–19)
Troponin T High Sensitivity: 6557 ng/L (ref 0–19)
Troponin T High Sensitivity: 6987 ng/L (ref 0–19)

## 2024-04-26 LAB — MAGNESIUM: Magnesium: 1.8 mg/dL (ref 1.7–2.4)

## 2024-04-26 LAB — PHOSPHORUS: Phosphorus: 6.7 mg/dL — ABNORMAL HIGH (ref 2.5–4.6)

## 2024-04-26 LAB — HEMOGLOBIN A1C
Hgb A1c MFr Bld: 6.2 % — ABNORMAL HIGH (ref 4.8–5.6)
Mean Plasma Glucose: 131.24 mg/dL

## 2024-04-26 LAB — HEPARIN LEVEL (UNFRACTIONATED)
Heparin Unfractionated: <0.1 [IU]/mL — ABNORMAL LOW (ref 0.30–0.70)
Heparin Unfractionated: 0.26 [IU]/mL — ABNORMAL LOW (ref 0.30–0.70)

## 2024-04-26 LAB — MRSA NEXT GEN BY PCR, NASAL: MRSA by PCR Next Gen: DETECTED — AB

## 2024-04-26 MED ORDER — HEPARIN BOLUS VIA INFUSION
3000.0000 [IU] | Freq: Once | INTRAVENOUS | Status: AC
  Administered 2024-04-26: 3000 [IU] via INTRAVENOUS
  Filled 2024-04-26: qty 3000

## 2024-04-26 MED ORDER — ACETAMINOPHEN 325 MG PO TABS
650.0000 mg | ORAL_TABLET | Freq: Four times a day (QID) | ORAL | Status: DC | PRN
  Administered 2024-04-26 – 2024-04-27 (×2): 650 mg
  Filled 2024-04-26: qty 2

## 2024-04-26 MED ORDER — ORAL CARE MOUTH RINSE
15.0000 mL | OROMUCOSAL | Status: DC
  Administered 2024-04-26 – 2024-04-28 (×29): 15 mL via OROMUCOSAL

## 2024-04-26 MED ORDER — TIMOLOL MALEATE 0.5 % OP SOLN
1.0000 [drp] | Freq: Two times a day (BID) | OPHTHALMIC | Status: DC
  Administered 2024-04-26 – 2024-04-28 (×6): 1 [drp] via OPHTHALMIC
  Filled 2024-04-26: qty 5

## 2024-04-26 MED ORDER — CHLORHEXIDINE GLUCONATE CLOTH 2 % EX PADS: 6.0000 | MEDICATED_PAD | Freq: Every day | CUTANEOUS | Status: DC

## 2024-04-26 MED ORDER — MIDAZOLAM BOLUS VIA INFUSION
2.0000 mg | INTRAVENOUS | Status: DC | PRN
  Administered 2024-04-26 – 2024-04-28 (×4): 4 mg via INTRAVENOUS

## 2024-04-26 MED ORDER — FAMOTIDINE 20 MG PO TABS
10.0000 mg | ORAL_TABLET | Freq: Every day | ORAL | Status: DC
  Administered 2024-04-27 – 2024-04-28 (×2): 10 mg
  Filled 2024-04-26 (×2): qty 1

## 2024-04-26 MED ORDER — LATANOPROST 0.005 % OP SOLN
1.0000 [drp] | Freq: Every morning | OPHTHALMIC | Status: DC
  Administered 2024-04-26 – 2024-04-28 (×3): 1 [drp] via OPHTHALMIC
  Filled 2024-04-26: qty 2.5

## 2024-04-26 MED ORDER — MIDAZOLAM-SODIUM CHLORIDE 100-0.9 MG/100ML-% IV SOLN
0.5000 mg/h | INTRAVENOUS | Status: DC
  Administered 2024-04-26: 2 mg/h via INTRAVENOUS
  Administered 2024-04-27: 4 mg/h via INTRAVENOUS
  Filled 2024-04-26 (×4): qty 100

## 2024-04-26 MED ORDER — NITROGLYCERIN 2 % TD OINT
1.0000 [in_us] | TOPICAL_OINTMENT | Freq: Three times a day (TID) | TRANSDERMAL | Status: AC
  Administered 2024-04-27 – 2024-04-28 (×2): 1 [in_us] via TOPICAL
  Filled 2024-04-26 (×2): qty 1

## 2024-04-26 MED ORDER — ORAL CARE MOUTH RINSE: 15.0000 mL | OROMUCOSAL | Status: DC | PRN

## 2024-04-26 MED ORDER — LEVETIRACETAM (KEPPRA) 500 MG/5 ML ADULT IV PUSH
1000.0000 mg | Freq: Once | INTRAVENOUS | Status: AC
  Administered 2024-04-26: 1000 mg via INTRAVENOUS
  Filled 2024-04-26: qty 10

## 2024-04-26 MED ORDER — DORZOLAMIDE HCL 2 % OP SOLN
1.0000 [drp] | Freq: Two times a day (BID) | OPHTHALMIC | Status: DC
  Administered 2024-04-26 – 2024-04-28 (×6): 1 [drp] via OPHTHALMIC
  Filled 2024-04-26: qty 10

## 2024-04-26 MED ORDER — BRIMONIDINE TARTRATE 0.2 % OP SOLN
1.0000 [drp] | Freq: Two times a day (BID) | OPHTHALMIC | Status: DC
  Administered 2024-04-26 – 2024-04-28 (×6): 1 [drp] via OPHTHALMIC
  Filled 2024-04-26: qty 5

## 2024-04-26 MED ORDER — FREE WATER
30.0000 mL | Status: DC
  Administered 2024-04-26 – 2024-04-28 (×11): 30 mL

## 2024-04-26 MED ORDER — RENA-VITE PO TABS
1.0000 | ORAL_TABLET | Freq: Every day | ORAL | Status: DC
  Administered 2024-04-26 – 2024-04-27 (×2): 1
  Filled 2024-04-26 (×2): qty 1

## 2024-04-26 MED ORDER — MAGNESIUM SULFATE 2 GM/50ML IV SOLN
2.0000 g | Freq: Once | INTRAVENOUS | Status: AC
  Administered 2024-04-26: 2 g via INTRAVENOUS
  Filled 2024-04-26: qty 50

## 2024-04-26 MED ORDER — POLYVINYL ALCOHOL 1.4 % OP SOLN
1.0000 [drp] | Freq: Two times a day (BID) | OPHTHALMIC | Status: DC
  Administered 2024-04-26 – 2024-04-28 (×5): 1 [drp] via OPHTHALMIC
  Filled 2024-04-26: qty 15

## 2024-04-26 MED ORDER — PROSOURCE TF20 ENFIT COMPATIBL EN LIQD
60.0000 mL | Freq: Every day | ENTERAL | Status: DC
  Administered 2024-04-27 – 2024-04-28 (×2): 60 mL

## 2024-04-26 MED ORDER — MIDAZOLAM HCL (PF) 2 MG/2ML IJ SOLN
2.0000 mg | Freq: Once | INTRAMUSCULAR | Status: AC
  Administered 2024-04-26: 2 mg via INTRAVENOUS

## 2024-04-26 MED ORDER — THIAMINE HCL 100 MG PO TABS
100.0000 mg | ORAL_TABLET | Freq: Every day | ORAL | Status: DC
  Administered 2024-04-27 – 2024-04-28 (×2): 100 mg
  Filled 2024-04-26 (×4): qty 1

## 2024-04-26 MED ORDER — HEPARIN BOLUS VIA INFUSION
1500.0000 [IU] | Freq: Once | INTRAVENOUS | Status: AC
  Administered 2024-04-26: 1500 [IU] via INTRAVENOUS
  Filled 2024-04-26: qty 1500

## 2024-04-26 MED ORDER — SODIUM CHLORIDE 0.9 % IV SOLN
250.0000 mL | INTRAVENOUS | Status: AC
  Administered 2024-04-26: 250 mL via INTRAVENOUS

## 2024-04-26 MED ORDER — PHENTOLAMINE MESYLATE 5 MG IJ SOLR
5.0000 mg | Freq: Once | INTRAMUSCULAR | Status: AC
  Administered 2024-04-26: 5 mg via SUBCUTANEOUS
  Filled 2024-04-26: qty 5

## 2024-04-26 MED ORDER — ASPIRIN 81 MG PO CHEW
81.0000 mg | CHEWABLE_TABLET | Freq: Every day | ORAL | Status: DC
  Administered 2024-04-26 – 2024-04-28 (×3): 81 mg
  Filled 2024-04-26 (×3): qty 1

## 2024-04-26 MED ORDER — PIVOT 1.5 CAL PO LIQD
1000.0000 mL | ORAL | Status: DC
  Administered 2024-04-26: 1000 mL

## 2024-04-26 MED ORDER — MUPIROCIN 2 % EX OINT
1.0000 | TOPICAL_OINTMENT | Freq: Two times a day (BID) | CUTANEOUS | Status: DC
  Administered 2024-04-26 – 2024-04-28 (×5): 1 via NASAL
  Filled 2024-04-26 (×2): qty 22

## 2024-04-26 MED ORDER — MIDAZOLAM HCL 2 MG/2ML IJ SOLN
INTRAMUSCULAR | Status: AC
  Filled 2024-04-26: qty 2

## 2024-04-26 MED ORDER — STERILE WATER FOR INJECTION IJ SOLN
INTRAMUSCULAR | Status: AC
  Filled 2024-04-26: qty 10

## 2024-04-26 NOTE — Progress Notes (Signed)
 PHARMACY - ANTICOAGULATION CONSULT NOTE  Pharmacy Consult for UFH infusion Indication: chest pain/ACS  Allergies[1]  Patient Measurements: Height: 5' 10 (177.8 cm) Weight: 108.4 kg (238 lb 15.7 oz) IBW/kg (Calculated) : 73 HEPARIN  DW (KG): 96.4  Vital Signs: Temp: 99.7 F (37.6 C) (01/13 1000) Temp Source: Esophageal (01/13 0800) BP: 104/52 (01/13 1000) Pulse Rate: 67 (01/13 1000)  Labs: Recent Labs    04/25/24 1342 04/25/24 2055 04/25/24 2304 04/26/24 0636  HGB 10.6*  --  9.5* 9.6*  HCT 33.7*  --  28.0* 29.2*  PLT 276  --  259 245  APTT  --  26  --   --   LABPROT  --  19.4*  --   --   INR  --  1.6*  --   --   HEPARINUNFRC  --   --   --  <0.10*  CREATININE 8.70*  --   --  9.51*    Estimated Creatinine Clearance: 10.2 mL/min (A) (by C-G formula based on SCr of 9.51 mg/dL (H)).   Medical History: Past Medical History:  Diagnosis Date   Anemia    Arthritis    Asthma    as child   Atypical mole 09/22/2022   R spinal mid back, needs excision   Barrett's esophagus 02/16/2014   short segment   Cataract    Coronary artery disease    DDD (degenerative disc disease), cervical    Diabetes mellitus without complication (HCC)    Diabetic osteomyelitis (HCC)    Diabetic peripheral neuropathy (HCC)    Diabetic ulcer of foot with muscle involvement without evidence of necrosis (HCC)    DIABETIC ULCERATIONS ASSOCIATED WITH IRRITATION LATERAL ANKLE LEFT GREATER THAN RIGHT WITH MILD CELLULITIS    Diverticulosis    DKA (diabetic ketoacidoses) 08/23/2017   Edema, lower extremity    ESRD on hemodialysis (HCC)    Fatty liver    Gastroparesis 2015   GERD (gastroesophageal reflux disease)    Gilbert's disease    Glaucoma    Hx of BKA, left (HCC)    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   PONV (postoperative nausea and vomiting)    PVD (peripheral vascular disease)    Seasonal allergies    Shoulder pain    Tubular adenoma of colon    Ulcer of foot (HCC) 11/17/2012   DIABETIC ULCERATIONS ASSOCIATED WITH IRRITATION LATERAL ANKLE LEFT GREATER THAN RIGHT WITH MILD CELLULITIS   Assessment: 59 y/o M presenting to the ED from HD with cardiac arrest. Pharmacy consulted to initiate heparin  infusion for ACS.   0113 0636 HL < 0.1, subthera; 1250 un/hr  Goal of Therapy:  Heparin  level 0.3-0.7 units/ml Monitor platelets by anticoagulation protocol: Yes   Plan:  --Heparin  level is subtherapeutic --Heparin  3000 unit IV bolus and increase heparin  infusion rate to 1600 units/hr --Re-check HL 8 hours from rate change --Daily CBC per protocol while on IV heparin   Marolyn KATHEE Mare 04/26/2024,12:22 PM    [1]  Allergies Allergen Reactions   Baclofen Other (See Comments)    Disorientation, Contraindication with dialysis    Nsaids Other (See Comments)    CKD on dialysis

## 2024-04-26 NOTE — Progress Notes (Addendum)
 Initial Nutrition Assessment  DOCUMENTATION CODES:   Obesity unspecified  INTERVENTION:   Initiate Pivot 1.5@55ml /hr- Initiate at 59ml/hr and increase by 10ml/hr q 8 hours until goal rate is reached.   ProSource TF 20- Give 60ml daily via tube, each supplement provides 80kcal and 20g of protein.   Free water  flushes 30ml q4 hours to maintain tube patency   Regimen provides 2060kcal/day, 144g/day protein and 1140ml/day of free water .   Rena-vit daily via tube   Thiamine  100mg  daily via tube x 7 days   Pt at high refeed risk; recommend monitor potassium, magnesium  and phosphorus labs daily until stable  Daily weights   Check B12, folate and vitamin D  levels  NUTRITION DIAGNOSIS:   Inadequate oral intake related to inability to eat (pt sedated and ventilated) as evidenced by NPO status.  GOAL:   Provide needs based on ASPEN/SCCM guidelines  MONITOR:   Vent status, Labs, Weight trends, TF tolerance, Skin, I & O's  REASON FOR ASSESSMENT:   Ventilator    ASSESSMENT:   59 y/o male whom is legally blind with h/o Barretts esophagus, L BKA, fatty liver, diverticulosis, HTN, Gilberts, PAD, DDD, CAD s/p CABG, IDDM and ESRD previously on PD and recently changed to HD who is admitted with cardiac arrest during HD complicated by possible seizures and shock.  Pt sedated and ventilated. OGT in place. Will plan to initiate tube feeds today. Plan is for possible CRRT initiation. Per chart, pt is down ~8lbs from his UBW.   Medications reviewed and include: aspirin , pepcid , insulin , miralax , senokot, ceftriaxone , heparin , levophed    Labs reviewed: Na 126(L), K 4.1 wnl, BUN 57(H), creat 9.51(H), P 6.7(H), Mg 1.8 wnl Wbc- 12.6(H), Hgb 9.6(L), Hct 29.2(L), MCV 107.4(H), MCH 35.3(H) Cbgs- 169, 172, 185 x 24 hrs  AIC 6.2(H)- 1/12  Patient is currently intubated on ventilator support MV: 10 L/min Temp (24hrs), Avg:98.8 F (37.1 C), Min:98.1 F (36.7 C), Max:100.6 F (38.1 C)  MAP-  >44mmHg   UOP-   NUTRITION - FOCUSED PHYSICAL EXAM:  Flowsheet Row Most Recent Value  Orbital Region No depletion  Upper Arm Region No depletion  Thoracic and Lumbar Region No depletion  Buccal Region No depletion  Temple Region No depletion  Clavicle Bone Region No depletion  Clavicle and Acromion Bone Region No depletion  Scapular Bone Region No depletion  Dorsal Hand No depletion  Patellar Region No depletion  Anterior Thigh Region No depletion  Posterior Calf Region No depletion  Edema (RD Assessment) None  Hair Reviewed  Eyes Reviewed  Mouth Reviewed  Skin Reviewed  Nails Reviewed   Diet Order:   Diet Order     None      EDUCATION NEEDS:   No education needs have been identified at this time  Skin:  Skin Assessment: Reviewed RN Assessment  Last BM:  pta  Height:   Ht Readings from Last 1 Encounters:  04/25/24 5' 10 (1.778 m)    Weight:   Wt Readings from Last 1 Encounters:  04/26/24 108.4 kg    BMI:  Body mass index is 34.29 kg/m.  Estimated Nutritional Needs:   Kcal:  1700-2000kcal/day  Protein:  >150g/day  Fluid:  UOP +1L  Augustin Shams MS, RD, LDN If unable to be reached, please send secure chat to RD inpatient available from 8:00a-4:00p daily

## 2024-04-26 NOTE — Progress Notes (Incomplete)
 Potential left hand IV infiltrated w/levophed . Dr. Isaiah at bedside. Orders written. PIV aspirated w/blood return, PIV removed, heat pack placed and arm elevated.

## 2024-04-26 NOTE — Progress Notes (Signed)
 PHARMACY - ANTICOAGULATION CONSULT NOTE  Pharmacy Consult for UFH infusion Indication: chest pain/ACS  Allergies[1]  Patient Measurements: Height: 5' 10 (177.8 cm) Weight: 108.4 kg (238 lb 15.7 oz) IBW/kg (Calculated) : 73 HEPARIN  DW (KG): 96.4  Vital Signs: Temp: 99.1 F (37.3 C) (01/13 1900) BP: 112/61 (01/13 1900) Pulse Rate: 74 (01/13 1900)  Labs: Recent Labs    04/25/24 1342 04/25/24 2055 04/25/24 2304 04/26/24 0636 04/26/24 2021  HGB 10.6*  --  9.5* 9.6*  --   HCT 33.7*  --  28.0* 29.2*  --   PLT 276  --  259 245  --   APTT  --  26  --   --   --   LABPROT  --  19.4*  --   --   --   INR  --  1.6*  --   --   --   HEPARINUNFRC  --   --   --  <0.10* 0.26*  CREATININE 8.70*  --   --  9.51*  --     Estimated Creatinine Clearance: 10.2 mL/min (A) (by C-G formula based on SCr of 9.51 mg/dL (H)).   Medical History: Past Medical History:  Diagnosis Date   Anemia    Arthritis    Asthma    as child   Atypical mole 09/22/2022   R spinal mid back, needs excision   Barrett's esophagus 02/16/2014   short segment   Cataract    Coronary artery disease    DDD (degenerative disc disease), cervical    Diabetes mellitus without complication (HCC)    Diabetic osteomyelitis (HCC)    Diabetic peripheral neuropathy (HCC)    Diabetic ulcer of foot with muscle involvement without evidence of necrosis (HCC)    DIABETIC ULCERATIONS ASSOCIATED WITH IRRITATION LATERAL ANKLE LEFT GREATER THAN RIGHT WITH MILD CELLULITIS    Diverticulosis    DKA (diabetic ketoacidoses) 08/23/2017   Edema, lower extremity    ESRD on hemodialysis (HCC)    Fatty liver    Gastroparesis 2015   GERD (gastroesophageal reflux disease)    Gilbert's disease    Glaucoma    Hx of BKA, left (HCC)    Hx of CABG 01/27/2019   2 vessels; LIMA-LAD, SVG-D1   Hx of heart artery stent 11/20/2009   80% D1 stenosis - 2.5 x 18 mm Xience V Everolimus (DES) stent x 1 placed   Hyperlipidemia    Hypertension     Left below-knee amputee (HCC)    Legally blind    Myocardial infarction (HCC) 11/2009   Neuropathy    Osteoarthritis of spine    Osteomyelitis of left foot (HCC)    Peritoneal dialysis catheter in place    Does dialysis over night.   PONV (postoperative nausea and vomiting)    PVD (peripheral vascular disease)    Seasonal allergies    Shoulder pain    Tubular adenoma of colon    Ulcer of foot (HCC) 11/17/2012   DIABETIC ULCERATIONS ASSOCIATED WITH IRRITATION LATERAL ANKLE LEFT GREATER THAN RIGHT WITH MILD CELLULITIS   Assessment: 59 y/o M presenting to the ED from HD with cardiac arrest. Pharmacy consulted to initiate heparin  infusion for ACS.   Date Time Results Comments 0113 0636 HL < 0.1 subthera; 1250 un/hr 0113 2021 HL 0.26 Subtherapeutic, 1600u/h  Goal of Therapy:  Heparin  level 0.3-0.7 units/ml Monitor platelets by anticoagulation protocol: Yes   Plan:  --Heparin  level is subtherapeutic --Heparin  1500 unit IV bolus and increase heparin  infusion  rate to 1750 units/hr --Re-check HL 8 hours from rate change --Daily CBC per protocol while on IV heparin   Sierah Lacewell Rodriguez-Guzman PharmD, BCPS 04/26/2024 9:08 PM     [1]  Allergies Allergen Reactions   Baclofen Other (See Comments)    Disorientation, Contraindication with dialysis    Nsaids Other (See Comments)    CKD on dialysis

## 2024-04-26 NOTE — Progress Notes (Signed)
 " [facility]  ROUNDING NOTE   Subjective:   Patient with past medical history of ESRD originally on peritoneal dialysis transitioning to HHD, fatty liver, gastroparesis, GERD, Gilbert's disease, severe diabetes mellitus type 2, peripheral neuropathy, coronary artery disease with history of CABG and stent placement, left below the knee amputation, legally blind, history of diabetic foot ulcer who was transitioning to HHD but unfortunately suffered cardiac arrest x 20 minutes on first treatment.  Update: Patient on the ventilator this a.m. Endotracheal tube in place.   Objective:  Vital signs in last 24 hours:  Temp:  [97.7 F (36.5 C)-98.8 F (37.1 C)] 98.6 F (37 C) (01/13 0700) Pulse Rate:  [36-84] 67 (01/13 0700) Resp:  [0-40] 20 (01/13 0700) BP: (65-176)/(32-131) 109/51 (01/13 0700) SpO2:  [84 %-100 %] 94 % (01/13 0700) FiO2 (%):  [40 %-85 %] 45 % (01/13 0500) Weight:  [108.4 kg-112.1 kg] 108.4 kg (01/13 0415)  Weight change:  Filed Weights   04/25/24 1630 04/25/24 2230 04/26/24 0415  Weight: 112.1 kg 108.4 kg 108.4 kg    Intake/Output: I/O last 3 completed shifts: In: 716.1 [I.V.:486.1; NG/GT:120; IV Piggyback:110] Out: 275 [Urine:275]   Intake/Output this shift:  No intake/output data recorded.  Physical Exam: General: Critically ill-appearing  Head: Normocephalic, atraumatic.  Endotracheal tube in place  Neck: Supple  Lungs:  Scattered rhonchi  Heart: S1S2 no rubs  Abdomen:  Soft, nontender, bowel sounds present  Extremities: Left BKA  Neurologic: Intubated, not following commands  Skin: No acute rash  Access: R internal jugular PC right IJ PermCath    Basic Metabolic Panel: Recent Labs  Lab 04/25/24 1342 04/25/24 2055 04/26/24 0636  NA 129*  --  126*  K 4.6  --  4.1  CL 91*  --  87*  CO2 16*  --  23  GLUCOSE 175*  --  191*  BUN 49*  --  57*  CREATININE 8.70*  --  9.51*  CALCIUM  9.1  --  8.8*  MG  --  1.7 1.8  PHOS  --  6.4* 6.7*     Liver Function Tests: Recent Labs  Lab 04/25/24 1342 04/26/24 0636  AST 290* 113*  ALT 163* 109*  ALKPHOS 245* 223*  BILITOT 0.5 0.3  PROT 6.8 6.5  ALBUMIN  2.8* 2.8*   No results for input(s): LIPASE, AMYLASE in the last 168 hours. No results for input(s): AMMONIA in the last 168 hours.  CBC: Recent Labs  Lab 04/25/24 1342 04/25/24 2304 04/26/24 0636  WBC 18.9* 15.1* 12.6*  HGB 10.6* 9.5* 9.6*  HCT 33.7* 28.0* 29.2*  MCV 112.3* 107.7* 107.4*  PLT 276 259 245    Cardiac Enzymes: No results for input(s): CKTOTAL, CKMB, CKMBINDEX, TROPONINI in the last 168 hours.  BNP: Invalid input(s): POCBNP  CBG: Recent Labs  Lab 04/21/24 1342 04/25/24 1340 04/25/24 2004 04/26/24 0019 04/26/24 0316  GLUCAP 128* 176* 244* 219* 185*    Microbiology: Results for orders placed or performed during the hospital encounter of 04/25/24  Resp panel by RT-PCR (RSV, Flu A&B, Covid) Anterior Nasal Swab     Status: None   Collection Time: 04/25/24  3:24 PM   Specimen: Anterior Nasal Swab  Result Value Ref Range Status   SARS Coronavirus 2 by RT PCR NEGATIVE NEGATIVE Final    Comment: (NOTE) SARS-CoV-2 target nucleic acids are NOT DETECTED.  The SARS-CoV-2 RNA is generally detectable in upper respiratory specimens during the acute phase of infection. The lowest concentration of  SARS-CoV-2 viral copies this assay can detect is 138 copies/mL. A negative result does not preclude SARS-Cov-2 infection and should not be used as the sole basis for treatment or other patient management decisions. A negative result may occur with  improper specimen collection/handling, submission of specimen other than nasopharyngeal swab, presence of viral mutation(s) within the areas targeted by this assay, and inadequate number of viral copies(<138 copies/mL). A negative result must be combined with clinical observations, patient history, and epidemiological information. The expected  result is Negative.  Fact Sheet for Patients:  bloggercourse.com  Fact Sheet for Healthcare Providers:  seriousbroker.it  This test is no t yet approved or cleared by the [place]  FDA and  has been authorized for detection and/or diagnosis of SARS-CoV-2 by FDA under an Emergency Use Authorization (EUA). This EUA will remain  in effect (meaning this test can be used) for the duration of the COVID-19 declaration under Section 564(b)(1) of the Act, 21 U.S.C.section 360bbb-3(b)(1), unless the authorization is terminated  or revoked sooner.       Influenza A by PCR NEGATIVE NEGATIVE Final   Influenza B by PCR NEGATIVE NEGATIVE Final    Comment: (NOTE) The Xpert Xpress SARS-CoV-2/FLU/RSV plus assay is intended as an aid in the diagnosis of influenza from Nasopharyngeal swab specimens and should not be used as a sole basis for treatment. Nasal washings and aspirates are unacceptable for Xpert Xpress SARS-CoV-2/FLU/RSV testing.  Fact Sheet for Patients: bloggercourse.com  Fact Sheet for Healthcare Providers: seriousbroker.it  This test is not yet approved or cleared by the [place]  FDA and has been authorized for detection and/or diagnosis of SARS-CoV-2 by FDA under an Emergency Use Authorization (EUA). This EUA will remain in effect (meaning this test can be used) for the duration of the COVID-19 declaration under Section 564(b)(1) of the Act, 21 U.S.C. section 360bbb-3(b)(1), unless the authorization is terminated or revoked.     Resp Syncytial Virus by PCR NEGATIVE NEGATIVE Final    Comment: (NOTE) Fact Sheet for Patients: bloggercourse.com  Fact Sheet for Healthcare Providers: seriousbroker.it  This test is not yet approved or cleared by the [place]  FDA and has been authorized for detection and/or diagnosis of  SARS-CoV-2 by FDA under an Emergency Use Authorization (EUA). This EUA will remain in effect (meaning this test can be used) for the duration of the COVID-19 declaration under Section 564(b)(1) of the Act, 21 U.S.C. section 360bbb-3(b)(1), unless the authorization is terminated or revoked.  Performed at [facility], [address] [place], [place] [address]   MRSA Next Gen by PCR, Nasal     Status: Abnormal   Collection Time: 04/25/24 10:19 PM   Specimen: Nasal Mucosa; Nasal Swab  Result Value Ref Range Status   MRSA by PCR Next Gen DETECTED (A) NOT DETECTED Final    Comment: RESULT CALLED TO, READ BACK BY AND VERIFIED WITH:  ABIGAIL SANTOS AT 0050 04/26/24 JG (NOTE) The GeneXpert MRSA Assay (FDA approved for NASAL specimens only), is one component of a comprehensive MRSA colonization surveillance program. It is not intended to diagnose MRSA infection nor to guide or monitor treatment for MRSA infections. Test performance is not FDA approved in patients less than 46 years old. Performed at [facility], [address] [place], [place] [address]   Body fluid culture w Gram Stain     Status: None (Preliminary result)   Collection Time: 04/25/24 11:19 PM   Specimen: Peritoneal Cavity; Body Fluid  Result Value Ref  Range Status   Specimen Description   Final    PERITONEAL CAVITY Performed at [facility] [place] [facility], [address], [place], [place] [address]    Special Requests   Final    NONE Performed at [facility], [address] [place], [place] [address]    Gram Stain   Final    RARE WBC PRESENT, PREDOMINANTLY PMN NO ORGANISMS SEEN Performed at [facility], [address], [place], [place] [address]    Culture PENDING  Incomplete   Report Status PENDING  Incomplete   *Note: Due to a large number of results and/or encounters for the requested time period, some results have not been displayed. A complete set of results can be found  in Results Review.    Coagulation Studies: Recent Labs    04/25/24 May 23, 2053  LABPROT 19.4*  INR 1.6*    Urinalysis: No results for input(s): COLORURINE, LABSPEC, PHURINE, GLUCOSEU, HGBUR, BILIRUBINUR, KETONESUR, PROTEINUR, UROBILINOGEN, NITRITE, LEUKOCYTESUR in the last 72 hours.  Invalid input(s): APPERANCEUR    Imaging: US  Venous Img Lower Bilateral (DVT) Result Date: 04/26/2024 EXAM: ULTRASOUND DUPLEX OF THE BILATERAL LOWER EXTREMITY VEINS TECHNIQUE: Duplex ultrasound using B-mode/gray scaled imaging and Doppler spectral analysis and color flow was obtained of the deep venous structures of the bilateral lower extremity. COMPARISON: None available. CLINICAL HISTORY: 201733 DVT (deep venous thrombosis) (HCC) 798266 DVT (deep venous thrombosis) (HCC) FINDINGS: The common femoral vein, femoral vein, popliteal vein, and posterior tibial vein demonstrate normal compressibility with normal color flow and spectral analysis bilaterally . IMPRESSION: 1. No evidence of DVT bilaterally . Electronically signed by: Oneil Devonshire MD MD 04/26/2024 02:32 AM EST RP Workstation: [id number]   DG Chest Port 1 View Result Date: 04/25/2024 CLINICAL DATA:  Intubated EXAM: PORTABLE CHEST 1 VIEW COMPARISON:  04/25/2024 FINDINGS: Single frontal view of the chest demonstrates endotracheal tube overlying tracheal air column, tip approximately 10 cm above the carina. Enteric catheter passes below diaphragm, tip excluded by collimation. Right internal jugular dialysis catheter unchanged. Stable cardiac silhouette. No airspace disease, effusion, or pneumothorax. No acute bony abnormalities. IMPRESSION: 1. Retraction of endotracheal tube since prior study, with tip now approximately 10 cm above carina. 2. No acute airspace disease. Electronically Signed   By: Ozell Daring M.D.   On: 04/25/2024 23:23   DG Chest Port 1 View Result Date: 04/25/2024 EXAM: 1 VIEW(S) XRAY OF THE CHEST 04/25/2024 07:06:16 PM  COMPARISON: 04/25/2024 CLINICAL HISTORY: Endotracheal tube present FINDINGS: LINES, TUBES AND DEVICES: Endotracheal tube in place with tip at midthoracic trachea, 5.9 cm above the carina. Gastric tube in place, courses below diaphragm with tip outside field of view. Right IJ CVC in place with tip at [facility]. Defibrillator pads noted overlying chest. LUNGS AND PLEURA: Low lung volumes. No focal pulmonary opacity. No pleural effusion. No pneumothorax. HEART AND MEDIASTINUM: No acute abnormality of the cardiac and mediastinal silhouettes. BONES AND SOFT TISSUES: Prior sternotomy and CABG noted. No acute osseous abnormality. IMPRESSION: 1. Endotracheal tube, gastric tube, and right IJ CVC in appropriate position. 2. No acute cardiopulmonary abnormality. Pulmonary hypoinflation 3. Prior sternotomy and CABG. Electronically signed by: Dorethia Molt MD MD 04/25/2024 08:13 PM EST RP Workstation: [id number]   CT CHEST ABDOMEN PELVIS W CONTRAST Result Date: 04/25/2024 EXAM: CT CHEST, ABDOMEN AND PELVIS WITH CONTRAST 04/25/2024 05:35:00 PM TECHNIQUE: CT of the chest, abdomen and pelvis was performed with the administration of 100 mL of iohexol  (OMNIPAQUE ) 300 MG/ML solution. Multiplanar reformatted images are provided for review.  Automated exposure control, iterative reconstruction, and/or weight based adjustment of the mA/kV was utilized to reduce the radiation dose to as low as reasonably achievable. COMPARISON: 08/28/2023 CLINICAL HISTORY: Resuscitated arrest. FINDINGS: CHEST: MEDIASTINUM AND LYMPH NODES: Low lying endotracheal tube positioned at the ostium of the right mainstem bronchus. 3 cm off retraction recommended. Borderline cardiomegaly. Dense multivessel coronary atherosclerosis with changes of a prior CABG procedure. Sternotomy wires. Tunneled right IJ approach hemodialysis catheter in place terminating in the high right atrium. The central airways are clear. No mediastinal, hilar or axillary lymphadenopathy. LUNGS  AND PLEURA: Patchy dependent airspace opacities in both lungs with a small amount of central ground glass airspace opacity in the left upper lobe. Areas of dependent nodularity noted in the left lower lobe. Trace right pleural effusion. No pneumothorax. ABDOMEN AND PELVIS: LIVER: Cholecystectomy. GALLBLADDER AND BILE DUCTS: Unremarkable. No biliary ductal dilatation. SPLEEN: No acute abnormality. PANCREAS: No acute abnormality. ADRENAL GLANDS: No acute abnormality. KIDNEYS, URETERS AND BLADDER: Urinary bladder is completely decompressed with a urinary catheter in place. No stones in the kidneys or ureters. No hydronephrosis. No perinephric or periureteral stranding. GI AND BOWEL: Esophagogastric tube is well positioned terminating in the stomach. Scattered sigmoid diverticulosis. Normal appendix. There is no bowel obstruction. REPRODUCTIVE ORGANS: No acute abnormality. PERITONEUM AND RETROPERITONEUM: Well positioned peritoneal dialysis catheter terminating in the pelvis. Small volume free fluid in the abdomen, likely related to the peritoneal dialysate. No free air. VASCULATURE: Diffuse aortoiliac atherosclerosis. Aorta is normal in caliber. ABDOMINAL AND PELVIS LYMPH NODES: No lymphadenopathy. BONES AND SOFT TISSUES: Mildly displaced fractures of the right anterior 2nd and 5th ribs. Nondisplaced buckle type fractures of the anterior right 3rd, 4th, and 6th ribs. Nondisplaced buckle type fracture of the anterior left 3rd rib with a mildly displaced anterior left 4th rib fracture. Anterior buckle fractures of the anterior left 5th and 6th ribs. Moderate volume symmetric bilateral gynecomastia. Small fat containing bilateral inguinal hernias. IMPRESSION: 1. Low lying endotracheal tube positioned at the ostium of the right mainstem bronchus. 3 cm of retraction is recommended. 2. Patchy dependent airspace opacities in both lungs with a small amount of central ground glass airspace opacity in the left upper lobe and  areas of dependent nodularity in the left lower lobe. This may reflect changes of aspiration or a developing bronchopneumonia. 3. Multiple acute rib fractures bilaterally, as described above. Trace right pleural effusion. No pneumothorax. 4. Well-positioned esophagogastric and right IJ approach hemodialysis catheters. 5. Small volume ascites in the abdomen, likely related to peritoneal dialysate. 6. Scattered sigmoid diverticulosis. Electronically signed by: Rogelia Myers MD MD 04/25/2024 06:51 PM EST RP Workstation: [id number]   CT Head Wo Contrast Result Date: 04/25/2024 CLINICAL DATA:  Witnessed cardiac arrest status post resuscitation EXAM: CT HEAD WITHOUT CONTRAST TECHNIQUE: Contiguous axial images were obtained from the base of the skull through the vertex without intravenous contrast. RADIATION DOSE REDUCTION: This exam was performed according to the departmental dose-optimization program which includes automated exposure control, adjustment of the mA and/or kV according to patient size and/or use of iterative reconstruction technique. COMPARISON:  07/26/2023 FINDINGS: Brain: No acute infarct or hemorrhage. Stable chronic small vessel ischemic changes within the left thalamus. Lateral ventricles and midline structures are unremarkable. No acute extra-axial fluid collections. No mass effect. Vascular: No hyperdense vessel or unexpected calcification. Skull: Normal. Negative for fracture or focal lesion. Sinuses/Orbits: Opacification of the right frontal, bilateral ethmoid, and sphenoid sinus, which has progressed since prior study. Other: None. IMPRESSION: 1. No acute  intracranial process. 2. Progressive chronic sinus disease. Electronically Signed   By: Ozell Daring M.D.   On: 04/25/2024 18:34   DG Abdomen 1 View Result Date: 04/25/2024 CLINICAL DATA:  Enteric catheter placement EXAM: ABDOMEN - 1 VIEW COMPARISON:  04/29/2023 FINDINGS: Frontal view of the lower chest and upper abdomen demonstrates  enteric catheter passing below diaphragm, tip and side port projecting over the gastric fundus. Bowel gas pattern is unremarkable. IMPRESSION: 1. Enteric catheter tip projecting over the gastric fundus. Electronically Signed   By: Ozell Daring M.D.   On: 04/25/2024 15:17   DG Chest 1 View Result Date: 04/25/2024 CLINICAL DATA:  Intubated EXAM: CHEST  1 VIEW COMPARISON:  08/27/2023 FINDINGS: Single frontal view of the chest demonstrates endotracheal tube overlying tracheal air column, tip approximately 4 cm above carina. Enteric catheter passes below diaphragm, tip excluded by collimation. Right internal jugular dialysis catheter tip overlies superior vena cava. 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 Cardiac silhouette is unremarkable. No acute airspace disease, effusion, or pneumothorax. IMPRESSION: 1. Support devices as above. 2. No acute airspace disease. Electronically Signed   By: Ozell Daring M.D.   On: 04/25/2024 15:16     Medications:    sodium chloride  Stopped (04/26/24 0228)   cefTRIAXone  (ROCEPHIN )  IV Stopped (04/25/24 2031)   heparin  1,250 Units/hr (04/26/24 0641)   magnesium  sulfate bolus IVPB     norepinephrine  (LEVOPHED ) Adult infusion 3 mcg/min (04/26/24 0641)   propofol  (DIPRIVAN ) infusion 10 mcg/kg/min (04/26/24 0641)    brimonidine   1 drop Both Eyes BID   Chlorhexidine  Gluconate Cloth  6 each Topical Daily   dorzolamide   1 drop Both Eyes BID   famotidine   20 mg Per Tube BID   insulin  aspart  0-6 Units Subcutaneous Q4H   mupirocin  ointment  1 Application Nasal BID   mouth rinse  15 mL Mouth Rinse Q2H   polyethylene glycol  17 g Per Tube Daily   senna  1 tablet Per Tube BID   timolol   1 drop Both Eyes BID   fentaNYL  (SUBLIMAZE ) injection, fentaNYL  (SUBLIMAZE ) injection, mouth rinse, polyethylene glycol, senna  Assessment/ Plan:  59 y.o. male with past medical history of ESRD originally on peritoneal dialysis transitioning to HHD, fatty liver, gastroparesis,  GERD, Gilbert's disease, severe diabetes mellitus type 2, peripheral neuropathy, coronary artery disease with history of CABG and stent placement, left below the knee amputation, legally blind, history of diabetic foot ulcer who was transitioning to HHD but unfortunately suffered cardiac arrest x 20 minutes on first treatment.  1.  ESRD transitioning to HHD.  Patient apparently with failed PD treatments.  He was transitioning to HHD however on first HHD treatment as outpatient he suffered cardiac arrest x 20 minutes.  Hold off on further dialysis treatment for now.  Consider CRRT tomorrow if he remains critically ill.  2.  Acute respiratory failure/PEA arrest.  As above patient suffered cardiac arrest during first outpatient HHD treatment at the dialysis center.  Continue ventilatory support at this time.  3.  Hypotension.  Maintain the patient on norepinephrine .  4.  Anemia of chronic kidney disease.  Hemoglobin 9.6.  Hold off on Epogen for now.   LOS: 1 Boleslaw Borghi 1/13/20268:00 AM   "

## 2024-04-26 NOTE — Progress Notes (Signed)
 EEG at bedside.

## 2024-04-26 NOTE — Consult Note (Signed)
 "  Cardiology Consultation   Patient ID: Chase Clark MRN: [id number]; DOB: 06-26-1965  Admit date: 04/25/2024 Date of Consult: 04/26/2024  PCP:  Patient, No Pcp Per   [facility] Providers Cardiologist:  Lonni Hanson, MD        Patient Profile: Chase Clark is a 59 y.o. male with a hx of coronary artery disease with remote PCI and subsequent CABG in 2010 (off-pump LIMA-LAD and SVG-D1), chronic HFpEF, HTN, HLD, IDDM, ESRD, left foot wound status post BKA, and Gaubert syndrome, who is being seen 04/26/2024 for the evaluation of cardiac arrest and elevated troponin at the request of Dr. Isaiah.  History of Present Illness: The patient is currently intubated and sedated; history is obtained from his chart and his family at the bedside.  Chase Clark has been struggling with volume management with peritoneal dialysis.  He recently underwent PermCath placement and was at his first hemodialysis session yesterday.  Approximately 45 minutes into the HD session, he suddenly became unresponsive.  CPR was reportedly started immediately by the hemodialysis team.  Notes indicate that his initial rhythm when first responders arrived was asystole, though his family also makes mention of PEA.  He was transported to the ER where he was noted to have a pulse with spontaneous respirations.  Indwelling Igel was exchanged for an endotracheal tube.  Hypotension was noted, necessitating initiation of norepinephrine  to maintain mean arterial pressure above 65 mmHg.  Elevated troponins were noted, most recently still climbing at 6987.  Chase Clark family notes that he recently complained of some indigestion and fatigue but otherwise had been in his usual state of health.  Past Medical History:  Diagnosis Date   Anemia    Arthritis    Asthma    as child   Atypical mole 09/22/2022   R spinal mid back, needs excision   Barrett's esophagus 02/16/2014   short segment   Cataract    Coronary artery  disease    DDD (degenerative disc disease), cervical    Diabetes mellitus without complication (HCC)    Diabetic osteomyelitis (HCC)    Diabetic peripheral neuropathy (HCC)    Diabetic ulcer of foot with muscle involvement without evidence of necrosis (HCC)    DIABETIC ULCERATIONS ASSOCIATED WITH IRRITATION LATERAL ANKLE LEFT GREATER THAN RIGHT WITH MILD CELLULITIS    Diverticulosis    DKA (diabetic ketoacidoses) 08/23/2017   Edema, lower extremity    ESRD on hemodialysis (HCC)    Fatty liver    Gastroparesis 2015   GERD (gastroesophageal reflux disease)    Gilbert's disease    Glaucoma    Hx of BKA, left (HCC)    Hx of CABG 01/27/2019   2 vessels; LIMA-LAD, SVG-D1   Hx of heart artery stent 11/20/2009   80% D1 stenosis - 2.5 x 18 mm Xience V Everolimus (DES) stent x 1 placed   Hyperlipidemia    Hypertension    Left below-knee amputee (HCC)    Legally blind    Myocardial infarction (HCC) 11/2009   Neuropathy    Osteoarthritis of spine    Osteomyelitis of left foot (HCC)    Peritoneal dialysis catheter in place    Does dialysis over night.   PONV (postoperative nausea and vomiting)    PVD (peripheral vascular disease)    Seasonal allergies    Shoulder pain    Tubular adenoma of colon    Ulcer of foot (HCC) 11/17/2012   DIABETIC ULCERATIONS ASSOCIATED WITH IRRITATION LATERAL ANKLE  LEFT GREATER THAN RIGHT WITH MILD CELLULITIS    Past Surgical History:  Procedure Laterality Date   ABDOMINAL AORTOGRAM N/A 05/20/2016   Procedure: Abdominal Aortogram possible intervention;  Surgeon: Cordella KANDICE Shawl, MD;  Location: [facility] INV[facility]IVE [facility];  Service: Cardiovascular;  Laterality: N/A;   AMPUTATION Left 09/05/2017   Procedure: AMPUTATION BELOW KNEE;  Surgeon: Dozier Soulier, MD;  Location: [facility] OR;  Service: Orthopedics;  Laterality: Left;   ANTERIOR CERVICAL DECOMP/DISCECTOMY FUSION N/A 05/07/2017   Procedure: ANTERIOR CERVICAL DECOMPRESSION/DISCECTOMY CORTEZ REDHEAD  PROSTHESIS,PLATE/SCREWS CERVICAL FIVE - CERVICAL SIX;  Surgeon: Mavis Purchase, MD;  Location: [facility] OR;  Service: Neurosurgery;  Laterality: N/A;   CAPD INSERTION N/A 08/30/2020   Procedure: LAPAROSCOPIC INSERTION CONTINUOUS AMBULATORY PERITONEAL DIALYSIS  (CAPD) CATHETER;  Surgeon: Desiderio Schanz, MD;  Location: [facility] ORS;  Service: General;  Laterality: N/A;   CATARACT EXTRACTION W/ INTRAOCULAR LENS IMPLANT     CATARACT EXTRACTION W/PHACO Left 02/26/2017   Procedure: CATARACT EXTRACTION PHACO AND INTRAOCULAR LENS PLACEMENT (IOC);  Surgeon: Myrna Adine Anes, MD;  Location: [facility] ORS;  Service: Ophthalmology;  Laterality: Left;  Lot # [id number] H US : 00:25.3 AP%: 6.2 CDE: 1.59    CHOLECYSTECTOMY N/A    COLONOSCOPY WITH PROPOFOL  N/A 05/30/2021   Procedure: COLONOSCOPY WITH PROPOFOL ;  Surgeon: Albertus Gordy HERO, MD;  Location: [facility];  Service: Gastroenterology;  Laterality: N/A;   CORONARY ANGIOPLASTY WITH STENT PLACEMENT  11/20/2009   80% D1 stenosis - 2.5 x 18 mm Xience V Everolimus (DES) stent x 1 placed; LOCATION: [facility]; SURGEON: Vinie Jude, MD   CORONARY ARTERY BYPASS GRAFT N/A 01/27/2019   Procedure: OFF PUMP CORONARY ARTERY BYPASS GRAFTING (CABG) X 2 WITH ENDOSCOPIC HARVESTING OF RIGHT GREATER SAPHENOUS VEIN. LIMA TO LAD;  Surgeon: Shyrl Linnie KIDD, MD;  Location: [facility] OR;  Service: Open Heart Surgery;  Laterality: N/A;   CORONARY STENT INTERVENTION N/A 01/21/2019   Procedure: CORONARY STENT INTERVENTION;  Surgeon: Claudene Victory ORN, MD;  Location: [facility];  Service: Cardiovascular;  Laterality: N/A;   DIALYSIS/PERMA CATHETER INSERTION N/A 04/21/2024   Procedure: DIALYSIS/PERMA CATHETER INSERTION;  Surgeon: Marea Selinda RAMAN, MD;  Location: [facility];  Service: Cardiovascular;  Laterality: N/A;   EPIBLEPHERON REPAIR WITH TEAR DUCT PROBING     EYE SURGERY Right 02/09/2014   cataract extraction   INSERTION EXPRESS TUBE SHUNT Right 09/06/2015   Procedure: INSERTION AHMED TUBE  SHUNT with tutoplast allograft;  Surgeon: Adine Anes Myrna, MD;  Location: [facility] ORS;  Service: Ophthalmology;  Laterality: Right;   laparoscopic insertion continuous peritoneal dialysis  2022   LEFT HEART CATH AND CORONARY ANGIOGRAPHY N/A 01/20/2019   Procedure: LEFT HEART CATH AND CORONARY ANGIOGRAPHY;  Surgeon: Court Dorn PARAS, MD;  Location: [facility];  Service: Cardiovascular;  Laterality: N/A;   POLYPECTOMY  05/30/2021   Procedure: POLYPECTOMY;  Surgeon: Albertus Gordy HERO, MD;  Location: [facility];  Service: Gastroenterology;;   TEE WITHOUT CARDIOVERSION N/A 01/27/2019   Procedure: TRANSESOPHAGEAL ECHOCARDIOGRAM (TEE);  Surgeon: Shyrl Linnie KIDD, MD;  Location: [facility] OR;  Service: Open Heart Surgery;  Laterality: N/A;   VASECTOMY         Scheduled Meds:  artificial tears  1 drop Both Eyes BID   aspirin   81 mg Per Tube Daily   brimonidine   1 drop Both Eyes BID   Chlorhexidine  Gluconate Cloth  6 each Topical Daily   dorzolamide   1 drop Both Eyes BID   [START ON 04/27/2024] famotidine   10 mg  Per Tube Daily   insulin  aspart  0-6 Units Subcutaneous Q4H   latanoprost   1 drop Both Eyes q AM   mupirocin  ointment  1 Application Nasal BID   nitroGLYCERIN   1 inch Topical Q8H   mouth rinse  15 mL Mouth Rinse Q2H   polyethylene glycol  17 g Per Tube Daily   senna  1 tablet Per Tube BID   sterile water  (preservative free)       timolol   1 drop Both Eyes BID   Continuous Infusions:  sodium chloride  250 mL (04/26/24 0921)   cefTRIAXone  (ROCEPHIN )  IV Stopped (04/25/24 2031)   heparin  1,600 Units/hr (04/26/24 1205)   midazolam  2 mg/hr (04/26/24 1404)   norepinephrine  (LEVOPHED ) Adult infusion 2 mcg/min (04/26/24 1400)   PRN Meds: fentaNYL  (SUBLIMAZE ) injection, fentaNYL  (SUBLIMAZE ) injection, mouth rinse, polyethylene glycol, senna, sterile water  (preservative free)  Allergies:   Allergies[1]  Social History:   Social History   Socioeconomic History   Marital status:  Married    Spouse name: Heinz   Number of children: 2   Years of education: Not on file   Highest education level: Not on file  Occupational History   Not on file  Tobacco Use   Smoking status: Former    Current packs/day: 0.00    Average packs/day: 0.5 packs/day for 20.0 years (10.0 ttl pk-yrs)    Types: Cigarettes    Start date: 03/14/1988    Quit date: 03/14/2008    Years since quitting: 16.1   Smokeless tobacco: Never  Vaping Use   Vaping status: Never Used  Substance and Sexual Activity   Alcohol  use: Not Currently    Comment: RARELY   Drug use: No   Sexual activity: Not on file  Other Topics Concern   Not on file  Social History Narrative   Used to work as paramedic    Married > Lives with wife.   Social Drivers of Health   Tobacco Use: Medium Risk (04/21/2024)   Patient History    Smoking Tobacco Use: Former    Smokeless Tobacco Use: Never    Passive Exposure: Not on file  Financial Resource Strain: Not on file  Food Insecurity: Patient Unable To Answer (04/25/2024)   Epic    Worried About Programme Researcher, Broadcasting/film/video in the Last Year: Patient unable to answer    Ran Out of Food in the Last Year: Patient unable to answer  Transportation Needs: Patient Unable To Answer (04/25/2024)   Epic    Lack of Transportation (Medical): Patient unable to answer    Lack of Transportation (Non-Medical): Patient unable to answer  Physical Activity: Not on file  Stress: Not on file  Social Connections: Unknown (02/26/2022)   Received from [facility]   Social Network    Social Network: Not on file  Intimate Partner Violence: Patient Unable To Answer (04/25/2024)   Epic    Fear of Current or Ex-Partner: Patient unable to answer    Emotionally Abused: Patient unable to answer    Physically Abused: Patient unable to answer    Sexually Abused: Patient unable to answer  Depression (PHQ2-9): Low Risk (08/06/2023)   Depression (PHQ2-9)    PHQ-2 Score: 0  Alcohol  Screen: Not on file   Housing: Unknown (04/25/2024)   Epic    Unable to Pay for Housing in the Last Year: Patient unable to answer    Number of Times Moved in the Last Year: 0    Homeless in the  Last Year: Patient unable to answer  Utilities: Not At Risk (04/25/2024)   Epic    Threatened with loss of utilities: No  Health Literacy: Not on file    Family History:   Family History  Problem Relation Age of Onset   Hyperlipidemia Mother    Diabetes Mother    Liver disease Mother        NASH   Other Mother        immune thrombocytopenic pupura (ITP)   Heart attack Father    Hyperlipidemia Father    Diabetes Father    Hypertension Father    Lymphoma Father    Hemochromatosis Other        Grandmother (? which side)   Polycythemia Other    Colon cancer Neg Hx    Esophageal cancer Neg Hx    Rectal cancer Neg Hx    Stomach cancer Neg Hx      ROS:  Unable to obtain as the patient is intubated and unresponsive.  Physical Exam/Data: Vitals:   04/26/24 1200 04/26/24 1300 04/26/24 1400 04/26/24 1401  BP: (!) 63/43 126/61 (!) 109/54 (!) 109/54  Pulse: 71 74 78 77  Resp: 20 20 20 20   Temp: 99.9 F (37.7 C) 100.2 F (37.9 C) (!) 100.6 F (38.1 C) (!) 100.6 F (38.1 C)  TempSrc:      SpO2: 96% 98% 97% 97%  Weight:      Height:        Intake/Output Summary (Last 24 hours) at 04/26/2024 1459 Last data filed at 04/26/2024 0641 Gross per 24 hour  Intake 716.05 ml  Output 275 ml  Net 441.05 ml      04/26/2024    4:15 AM 04/25/2024   10:30 PM 04/25/2024    4:30 PM  Last 3 Weights  Weight (lbs) 238 lb 15.7 oz 238 lb 15.7 oz 247 lb 1.6 oz  Weight (kg) 108.4 kg 108.4 kg 112.084 kg     Body mass index is 34.29 kg/m.  General: Unresponsive man lying in the ICU. HEENT: Endotracheal tube in place. Neck: Unable to assess JVP due to support devices.  Right IJ tunneled catheter in place with healing incision. Vascular: 1+ radial pulses bilaterally. Cardiac: Send heart sounds.  Regular rate and rhythm  without murmurs. Lungs: Clear anteriorly. Abd: soft, nontender, no hepatomegaly  Ext: No right lower extremity edema noted.  The patient has status post left BKA. Musculoskeletal: Left BKA noted. Skin: warm and dry  Neuro: Patient is sedated and unresponsive though occasional jerking movements are noted. Psych: Unable to assess as the patient is intubated and sedated.  EKG:  The EKG performed yesterday at 2:20 PM was personally reviewed and demonstrates: Normal sinus rhythm with subtle inferolateral ST depressions as well as posterior changes.  Left axis deviation and first-degree AV block also noted. Telemetry:  Telemetry was personally reviewed and demonstrates: Normal sinus rhythm  Relevant CV Studies: Echocardiogram performed yesterday is still awaiting official interpretation.  On my review of the images, LVEF is approximately 45% with inferior/inferolateral hypokinesis.  Laboratory Data: High Sensitivity Troponin:  No results for input(s): TROPONINIHS in the last 720 hours.  Recent Labs  Lab 04/25/24 1342 04/25/24 2304 04/26/24 0636 04/26/24 1226  TRNPT 4,922* 5,773* 6,557* 6,987*      Chemistry Recent Labs  Lab 04/25/24 1342 04/25/24 2055 04/26/24 0636  NA 129*  --  126*  K 4.6  --  4.1  CL 91*  --  87*  CO2  16*  --  23  GLUCOSE 175*  --  191*  BUN 49*  --  57*  CREATININE 8.70*  --  9.51*  CALCIUM  9.1  --  8.8*  MG  --  1.7 1.8  GFRNONAA 6*  --  6*  ANIONGAP 22*  --  16*    Recent Labs  Lab 04/25/24 1342 04/26/24 0636  PROT 6.8 6.5  ALBUMIN  2.8* 2.8*  AST 290* 113*  ALT 163* 109*  ALKPHOS 245* 223*  BILITOT 0.5 0.3   Lipids No results for input(s): CHOL, TRIG, HDL, LABVLDL, LDLCALC, CHOLHDL in the last 168 hours.  Hematology Recent Labs  Lab 04/25/24 1342 04/25/24 2304 04/26/24 0636  WBC 18.9* 15.1* 12.6*  RBC 3.00* 2.60* 2.72*  HGB 10.6* 9.5* 9.6*  HCT 33.7* 28.0* 29.2*  MCV 112.3* 107.7* 107.4*  MCH 35.3* 36.5* 35.3*  MCHC  31.5 33.9 32.9  RDW 14.2 14.0 13.9  PLT 276 259 245   Thyroid  No results for input(s): TSH, FREET4 in the last 168 hours.  BNPNo results for input(s): BNP, PROBNP in the last 168 hours.  DDimer No results for input(s): DDIMER in the last 168 hours.  Radiology/Studies:  ECHOCARDIOGRAM COMPLETE Result Date: 04/26/2024    ECHOCARDIOGRAM REPORT   Patient Name:   Chase Clark Date of Exam: 04/25/2024 Medical Rec #:  [id number]       Height:       70.0 in Accession #:    [id number]      Weight:       247.1 lb Date of Birth:  Jun 09, 1965       BSA:          2.284 m Patient Age:    58 years        BP:           128/77 mmHg Patient Gender: M               HR:           82 bpm. Exam Location:  [facility] Procedure: 2D Echo, Cardiac Doppler and Color Doppler (Both Spectral and Color            Flow Doppler were utilized during procedure). Indications:     I46.9 Cardiac Arrest  History:         Patient has prior history of Echocardiogram examinations. CAD                  and Previous Myocardial Infarction, Prior CABG; Risk                  Factors:Hypertension, Diabetes and Dyslipidemia. Chase Clark                  Renal Disease-Dialysis. Peripheral vascular disease.  Sonographer:     Carl Coma RDCS Referring Phys:  [id number] LONELL KANDICE MOOSE Diagnosing Phys: Dwayne D Callwood MD IMPRESSIONS  1. Left ventricular ejection fraction, by estimation, is 30 to 35%. The left ventricle has moderately decreased function. The left ventricle demonstrates regional wall motion abnormalities (see scoring diagram/findings for description). The left ventricular internal cavity size was mildly dilated. There is mild concentric left ventricular hypertrophy. Left ventricular diastolic parameters are consistent with Grade I diastolic dysfunction (impaired relaxation).  2. Right ventricular systolic function is normal. The right ventricular size is mildly enlarged.  3. Left atrial size was moderately dilated.  4. Right atrial  size was mildly dilated.  5. The mitral valve is grossly  normal. Trivial mitral valve regurgitation.  6. The aortic valve is calcified. Aortic valve regurgitation is trivial. Aortic valve sclerosis/calcification is present, without any evidence of aortic stenosis. FINDINGS  Left Ventricle: Left ventricular ejection fraction, by estimation, is 30 to 35%. The left ventricle has moderately decreased function. The left ventricle demonstrates regional wall motion abnormalities. Strain was performed and the global longitudinal strain is indeterminate. The left ventricular internal cavity size was mildly dilated. There is mild concentric left ventricular hypertrophy. Left ventricular diastolic parameters are consistent with Grade I diastolic dysfunction (impaired relaxation). Right Ventricle: The right ventricular size is mildly enlarged. No increase in right ventricular wall thickness. Right ventricular systolic function is normal. Left Atrium: Left atrial size was moderately dilated. Right Atrium: Right atrial size was mildly dilated. Pericardium: There is no evidence of pericardial effusion. Mitral Valve: The mitral valve is grossly normal. Trivial mitral valve regurgitation. Tricuspid Valve: The tricuspid valve is normal in structure. Tricuspid valve regurgitation is mild. Aortic Valve: The aortic valve is calcified. Aortic valve regurgitation is trivial. Aortic valve sclerosis/calcification is present, without any evidence of aortic stenosis. Pulmonic Valve: The pulmonic valve was not well visualized. Pulmonic valve regurgitation is not visualized. Aorta: The aortic root was not well visualized. IAS/Shunts: No atrial level shunt detected by color flow Doppler. Additional Comments: 3D was performed not requiring image post processing on an independent workstation and was indeterminate.  LEFT VENTRICLE PLAX 2D LVIDd:         5.10 cm   Diastology LVIDs:         4.40 cm   LV e' medial:    4.79 cm/s LV PW:         1.40 cm    LV E/e' medial:  16.4 LV IVS:        1.20 cm   LV e' lateral:   4.51 cm/s LVOT diam:     2.00 cm   LV E/e' lateral: 17.4 LV SV:         38 LV SV Index:   17 LVOT Area:     3.14 cm  RIGHT VENTRICLE            IVC RV Basal diam:  3.90 cm    IVC diam: 1.60 cm RV S prime:     9.31 cm/s TAPSE (M-mode): 1.6 cm LEFT ATRIUM             Index        RIGHT ATRIUM           Index LA diam:        4.70 cm 2.06 cm/m   RA Area:     16.60 cm LA Vol (A2C):   47.7 ml 20.89 ml/m  RA Volume:   44.30 ml  19.40 ml/m LA Vol (A4C):   34.6 ml 15.15 ml/m LA Biplane Vol: 41.3 ml 18.09 ml/m  AORTIC VALVE LVOT Vmax:   75.45 cm/s LVOT Vmean:  46.150 cm/s LVOT VTI:    0.122 m  AORTA Ao Root diam: 3.70 cm Ao Asc diam:  3.60 cm MITRAL VALVE MV Area (PHT): 4.13 cm    SHUNTS MV Decel Time: 184 msec    Systemic VTI:  0.12 m MV E velocity: 78.60 cm/s  Systemic Diam: 2.00 cm MV A velocity: 93.55 cm/s MV E/A ratio:  0.84 Dwayne D Callwood MD Electronically signed by Cara JONETTA Lovelace MD Signature Date/Time: 04/26/2024/2:59:25 PM    Final    US  Venous Img Lower Bilateral (DVT)  Result Date: 04/26/2024 EXAM: ULTRASOUND DUPLEX OF THE BILATERAL LOWER EXTREMITY VEINS TECHNIQUE: Duplex ultrasound using B-mode/gray scaled imaging and Doppler spectral analysis and color flow was obtained of the deep venous structures of the bilateral lower extremity. COMPARISON: None available. CLINICAL HISTORY: 201733 DVT (deep venous thrombosis) (HCC) 798266 DVT (deep venous thrombosis) (HCC) FINDINGS: The common femoral vein, femoral vein, popliteal vein, and posterior tibial vein demonstrate normal compressibility with normal color flow and spectral analysis bilaterally . IMPRESSION: 1. No evidence of DVT bilaterally . Electronically signed by: Oneil Devonshire MD MD 04/26/2024 02:32 AM EST RP Workstation: [id number]   DG Chest Port 1 View Result Date: 04/25/2024 CLINICAL DATA:  Intubated EXAM: PORTABLE CHEST 1 VIEW COMPARISON:  04/25/2024 FINDINGS: Single frontal  view of the chest demonstrates endotracheal tube overlying tracheal air column, tip approximately 10 cm above the carina. Enteric catheter passes below diaphragm, tip excluded by collimation. Right internal jugular dialysis catheter unchanged. Stable cardiac silhouette. No airspace disease, effusion, or pneumothorax. No acute bony abnormalities. IMPRESSION: 1. Retraction of endotracheal tube since prior study, with tip now approximately 10 cm above carina. 2. No acute airspace disease. Electronically Signed   By: Ozell Daring M.D.   On: 04/25/2024 23:23   DG Chest Port 1 View Result Date: 04/25/2024 EXAM: 1 VIEW(S) XRAY OF THE CHEST 04/25/2024 07:06:16 PM COMPARISON: 04/25/2024 CLINICAL HISTORY: Endotracheal tube present FINDINGS: LINES, TUBES AND DEVICES: Endotracheal tube in place with tip at midthoracic trachea, 5.9 cm above the carina. Gastric tube in place, courses below diaphragm with tip outside field of view. Right IJ CVC in place with tip at [facility]. Defibrillator pads noted overlying chest. LUNGS AND PLEURA: Low lung volumes. No focal pulmonary opacity. No pleural effusion. No pneumothorax. HEART AND MEDIASTINUM: No acute abnormality of the cardiac and mediastinal silhouettes. BONES AND SOFT TISSUES: Prior sternotomy and CABG noted. No acute osseous abnormality. IMPRESSION: 1. Endotracheal tube, gastric tube, and right IJ CVC in appropriate position. 2. No acute cardiopulmonary abnormality. Pulmonary hypoinflation 3. Prior sternotomy and CABG. Electronically signed by: Dorethia Molt MD MD 04/25/2024 08:13 PM EST RP Workstation: [id number]   CT CHEST ABDOMEN PELVIS W CONTRAST Result Date: 04/25/2024 EXAM: CT CHEST, ABDOMEN AND PELVIS WITH CONTRAST 04/25/2024 05:35:00 PM TECHNIQUE: CT of the chest, abdomen and pelvis was performed with the administration of 100 mL of iohexol  (OMNIPAQUE ) 300 MG/ML solution. Multiplanar reformatted images are provided for review. Automated exposure control, iterative  reconstruction, and/or weight based adjustment of the mA/kV was utilized to reduce the radiation dose to as low as reasonably achievable. COMPARISON: 08/28/2023 CLINICAL HISTORY: Resuscitated arrest. FINDINGS: CHEST: MEDIASTINUM AND LYMPH NODES: Low lying endotracheal tube positioned at the ostium of the right mainstem bronchus. 3 cm off retraction recommended. Borderline cardiomegaly. Dense multivessel coronary atherosclerosis with changes of a prior CABG procedure. Sternotomy wires. Tunneled right IJ approach hemodialysis catheter in place terminating in the high right atrium. The central airways are clear. No mediastinal, hilar or axillary lymphadenopathy. LUNGS AND PLEURA: Patchy dependent airspace opacities in both lungs with a small amount of central ground glass airspace opacity in the left upper lobe. Areas of dependent nodularity noted in the left lower lobe. Trace right pleural effusion. No pneumothorax. ABDOMEN AND PELVIS: LIVER: Cholecystectomy. GALLBLADDER AND BILE DUCTS: Unremarkable. No biliary ductal dilatation. SPLEEN: No acute abnormality. PANCREAS: No acute abnormality. ADRENAL GLANDS: No acute abnormality. KIDNEYS, URETERS AND BLADDER: Urinary bladder is completely decompressed with a urinary catheter in place. No stones in the kidneys or ureters. No  hydronephrosis. No perinephric or periureteral stranding. GI AND BOWEL: Esophagogastric tube is well positioned terminating in the stomach. Scattered sigmoid diverticulosis. Normal appendix. There is no bowel obstruction. REPRODUCTIVE ORGANS: No acute abnormality. PERITONEUM AND RETROPERITONEUM: Well positioned peritoneal dialysis catheter terminating in the pelvis. Small volume free fluid in the abdomen, likely related to the peritoneal dialysate. No free air. VASCULATURE: Diffuse aortoiliac atherosclerosis. Aorta is normal in caliber. ABDOMINAL AND PELVIS LYMPH NODES: No lymphadenopathy. BONES AND SOFT TISSUES: Mildly displaced fractures of the  right anterior 2nd and 5th ribs. Nondisplaced buckle type fractures of the anterior right 3rd, 4th, and 6th ribs. Nondisplaced buckle type fracture of the anterior left 3rd rib with a mildly displaced anterior left 4th rib fracture. Anterior buckle fractures of the anterior left 5th and 6th ribs. Moderate volume symmetric bilateral gynecomastia. Small fat containing bilateral inguinal hernias. IMPRESSION: 1. Low lying endotracheal tube positioned at the ostium of the right mainstem bronchus. 3 cm of retraction is recommended. 2. Patchy dependent airspace opacities in both lungs with a small amount of central ground glass airspace opacity in the left upper lobe and areas of dependent nodularity in the left lower lobe. This may reflect changes of aspiration or a developing bronchopneumonia. 3. Multiple acute rib fractures bilaterally, as described above. Trace right pleural effusion. No pneumothorax. 4. Well-positioned esophagogastric and right IJ approach hemodialysis catheters. 5. Small volume ascites in the abdomen, likely related to peritoneal dialysate. 6. Scattered sigmoid diverticulosis. Electronically signed by: Rogelia Myers MD MD 04/25/2024 06:51 PM EST RP Workstation: [id number]   CT Head Wo Contrast Result Date: 04/25/2024 CLINICAL DATA:  Witnessed cardiac arrest status post resuscitation EXAM: CT HEAD WITHOUT CONTRAST TECHNIQUE: Contiguous axial images were obtained from the base of the skull through the vertex without intravenous contrast. RADIATION DOSE REDUCTION: This exam was performed according to the departmental dose-optimization program which includes automated exposure control, adjustment of the mA and/or kV according to patient size and/or use of iterative reconstruction technique. COMPARISON:  07/26/2023 FINDINGS: Brain: No acute infarct or hemorrhage. Stable chronic small vessel ischemic changes within the left thalamus. Lateral ventricles and midline structures are unremarkable. No acute  extra-axial fluid collections. No mass effect. Vascular: No hyperdense vessel or unexpected calcification. Skull: Normal. Negative for fracture or focal lesion. Sinuses/Orbits: Opacification of the right frontal, bilateral ethmoid, and sphenoid sinus, which has progressed since prior study. Other: None. IMPRESSION: 1. No acute intracranial process. 2. Progressive chronic sinus disease. Electronically Signed   By: Ozell Daring M.D.   On: 04/25/2024 18:34   DG Abdomen 1 View Result Date: 04/25/2024 CLINICAL DATA:  Enteric catheter placement EXAM: ABDOMEN - 1 VIEW COMPARISON:  04/29/2023 FINDINGS: Frontal view of the lower chest and upper abdomen demonstrates enteric catheter passing below diaphragm, tip and side port projecting over the gastric fundus. Bowel gas pattern is unremarkable. IMPRESSION: 1. Enteric catheter tip projecting over the gastric fundus. Electronically Signed   By: Ozell Daring M.D.   On: 04/25/2024 15:17   DG Chest 1 View Result Date: 04/25/2024 CLINICAL DATA:  Intubated EXAM: CHEST  1 VIEW COMPARISON:  08/27/2023 FINDINGS: Single frontal view of the chest demonstrates endotracheal tube overlying tracheal air column, tip approximately 4 cm above carina. Enteric catheter passes below diaphragm, tip excluded by collimation. Right internal jugular dialysis catheter tip overlies superior vena cava. 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 Cardiac silhouette is unremarkable. No acute airspace disease, effusion, or pneumothorax. IMPRESSION: 1. Support devices as above. 2. No acute airspace  disease. Electronically Signed   By: Ozell Daring M.D.   On: 04/25/2024 15:16     Assessment and Plan: Cardiac arrest: Patient reportedly had asystole/PEA arrest during his first hemodialysis session yesterday.  Given his elevated troponin, inferolateral ST depressions with posterior changes, and inferior/inferolateral hypokinesis on echocardiogram, I worry that he may have new coronary lesion  that contributed to his event.  However, given his uncertain neurologic status, he is not a candidate for cardiac catheterization at this time.  I recommend continuation of heparin  infusion.  I will add aspirin  81 mg daily.  Initiate statin when LFTs improve.  NSTEMI: EKG concerning for possible posterior MI with inferolateral ST depressions and ST depressions also noted in V2 and V3.  Echo also demonstrates inferior/inferolateral hypokinesis.  High-sensitivity troponin I appears to be plateauing near 7000.  I recommend trending this until it has peaked.  Given concern about neurologic impairment postarrest and that inciting event occurred over 24 hours ago, I do not recommend cardiac catheterization at this time.  However, cath will need to be considered if Chase Clark makes meaningful neurologic recovery.  Ischemic cardiomyopathy: LVEF appears to be moderately reduced on echo with inferior/inferolateral hypokinesis.  As above, catheterization will need to be pursued if he makes a meaningful neurologic recovery.  At this time, he is still mildly hypotensive and therefore unable to tolerate any GDMT.  Hypotension: Slightly soft blood pressure noted, which is likely multifactorial.  Continue titration of norepinephrine  per primary team.  Anemia: Anemia chronic and slightly below baseline.  Recommend PRBC transfusion if hemoglobin falls below 8.  Risk Assessment/Risk Scores:    TIMI Risk Score for Unstable Angina or Non-ST Elevation MI:   The patient's TIMI risk score is 5, which indicates a 26% risk of all cause mortality, new or recurrent myocardial infarction or need for urgent revascularization in the next 14 days.  For questions or updates, please contact [facility] Please consult [email or web address] for contact info under [facility].  CRITICAL CARE TIME Performed by: Lonni Hanson, MD   Total critical care time: 35 minutes  Critical care time was exclusive of separately  billable procedures and treating other patients.  Critical care was necessary to treat or prevent imminent or life-threatening deterioration.  Critical care was time spent personally by me on the following activities: development of treatment plan with patient and/or surrogate as well as nursing, discussions with consultants, evaluation of patient's response to treatment, examination of patient, obtaining history from patient or surrogate, ordering and performing treatments and interventions, ordering and review of laboratory studies, ordering and review of radiographic studies, pulse oximetry and re-evaluation of patient's condition.  Signed, Lonni Hanson, MD  04/26/2024 2:59 PM     [1]  Allergies Allergen Reactions   Baclofen Other (See Comments)    Disorientation, Contraindication with dialysis    Nsaids Other (See Comments)    CKD on dialysis    "

## 2024-04-26 NOTE — Plan of Care (Signed)
" °  Problem: Fluid Volume: Goal: Ability to maintain a balanced intake and output will improve Outcome: Progressing   Problem: Health Behavior/Discharge Planning: Goal: Ability to identify and utilize available resources and services will improve Outcome: Progressing   Problem: Metabolic: Goal: Ability to maintain appropriate glucose levels will improve Outcome: Progressing   Problem: Skin Integrity: Goal: Risk for impaired skin integrity will decrease Outcome: Progressing   Problem: Clinical Measurements: Goal: Diagnostic test results will improve Outcome: Progressing Goal: Respiratory complications will improve Outcome: Progressing Goal: Cardiovascular complication will be avoided Outcome: Progressing   "

## 2024-04-26 NOTE — Progress Notes (Signed)
 SPIRITUAL CARE AND COUNSELING CONSULT NOTE   VISIT SUMMARY Chaplain provided spiritual/emotional support to [facility] loved one. Chaplain consulted with nurse team.   ZELPHIA GUILE                                                                                                                                                                      Type of Visit: Initial Care provided to:: Family Reason for visit: Grief/loss OnCall Visit: No   SPIRITUAL FRAMEWORK  Presenting Themes: Significant life change   GOALS   Self/Personal Goals: healing Clinical Care Goals: healing   INTERVENTIONS   Spiritual Care Interventions Made: Compassionate presence, Bereavement/grief support   Chaplain assessed Izack is supported strongly by loved ones. Bowie is a Christian man of faith. Mikhai is resting and desires healing.   INTERVENTION OUTCOMES   Outcomes: Connection to spiritual care, Patient family open to resources  SPIRITUAL CARE PLAN   Spiritual Care Issues Still Outstanding: Chaplain will continue to follow    If immediate needs arise, please contact [facility] 24 hour on call [phone]   Barabara Chess, Chaplain  04/26/2024 3:42 PM

## 2024-04-26 NOTE — Progress Notes (Signed)
 Eeg done

## 2024-04-26 NOTE — H&P (Signed)
 "  NAME:  Chase Clark, MRN:  [id number], DOB:  03/31/1966, LOS: 1 ADMISSION DATE:  04/25/2024, CONSULTATION DATE: 04/25/2024 REFERRING MD: Dr. Fernand,   CHIEF COMPLAINT: Cardiac Arrest   History of Present Illness:  This is a 59 yo male who presented to [facility] ER on 01/12 from hemodialysis following a cardiac arrest.  Per ER notes pt cardiac arrested 45 minutes following initiation of first outpatient  hemodialysis session.  Per ED notes pt was in asystole and EMS administered a dose of bicarb and calcium  gluconate prior to ROSC.  Igel inserted by EMS.   ED Course  Upon arrival to the ER pt appeared to be spontaneously breathing with Igel in place.  Igel exchanged for ETT by EDP.  Significant lab results were Na+ 129/chloride 91/CO2 16/glucose 175/BUN 49/creatinine 8.70/anion gap 22/AST 290/ALT 163/troponin 4,922/lactic acid 8.1/wbc 18.9/hgb 10.6.  CXR and COVID/RSV/Influenza A&B negative.  CT Chest/Abd/Pelvis and CT Head results pending.  Pt requiring levophed  gtt to maintain map >65.  Pt admitted to ICU per PCCM team for additional workup and treatment.    Pertinent  Medical History   Past Medical History:  Diagnosis Date   Anemia    Arthritis    Asthma    as child   Atypical mole 09/22/2022   R spinal mid back, needs excision   Barrett's esophagus 02/16/2014   short segment   Cataract    Coronary artery disease    DDD (degenerative disc disease), cervical    Diabetes mellitus without complication (HCC)    Diabetic osteomyelitis (HCC)    Diabetic peripheral neuropathy (HCC)    Diabetic ulcer of foot with muscle involvement without evidence of necrosis (HCC)    DIABETIC ULCERATIONS ASSOCIATED WITH IRRITATION LATERAL ANKLE LEFT GREATER THAN RIGHT WITH MILD CELLULITIS    Diverticulosis    DKA (diabetic ketoacidoses) 08/23/2017   Edema, lower extremity    ESRD on hemodialysis (HCC)    Fatty liver    Gastroparesis 2015   GERD (gastroesophageal reflux disease)    Gilbert's disease     Glaucoma    Hx of BKA, left (HCC)    Hx of CABG 01/27/2019   2 vessels; LIMA-LAD, SVG-D1   Hx of heart artery stent 11/20/2009   80% D1 stenosis - 2.5 x 18 mm Xience V Everolimus (DES) stent x 1 placed   Hyperlipidemia    Hypertension    Left below-knee amputee (HCC)    Legally blind    Myocardial infarction (HCC) 11/2009   Neuropathy    Osteoarthritis of spine    Osteomyelitis of left foot (HCC)    Peritoneal dialysis catheter in place    Does dialysis over night.   PONV (postoperative nausea and vomiting)    PVD (peripheral vascular disease)    Seasonal allergies    Shoulder pain    Tubular adenoma of colon    Ulcer of foot (HCC) 11/17/2012   DIABETIC ULCERATIONS ASSOCIATED WITH IRRITATION LATERAL ANKLE LEFT GREATER THAN RIGHT WITH MILD CELLULITIS   Micro Data:    COVID/RSV/Flu A&B 01/12>>negative  Blood x2 01/12>> MRSA PCR 01/12>> Tracheal aspirate 01/12>> Blood x2 01/12>>   Anti-infectives (From admission, onward)    Start     Dose/Rate Route Frequency Ordered Stop   04/25/24 1900  cefTRIAXone  (ROCEPHIN ) 2 g in sodium chloride  0.9 % 100 mL IVPB        2 g 200 mL/hr over 30 Minutes Intravenous Every 24 hours 04/25/24 1847  Significant Hospital Events: Including procedures, antibiotic start and stop dates in addition to other pertinent events   01/12: Admitted to ICU post cardiac arrest during initial outpatient hemodialysis session requiring mechanical intubation  1/13 remains on  vent, +myoclonus, started KEPPRA   Interim History / Subjective:  Remains critically ill Remains intubated Requires VENT support for survival EEG  pending WUA pending-wife at bedside  Objective    Blood pressure (!) 109/51, pulse 67, temperature 98.6 F (37 C), resp. rate 20, height 5' 10 (1.778 m), weight 108.4 kg, SpO2 94%.    Vent Mode: PRVC FiO2 (%):  [40 %-85 %] 45 % Set Rate:  [20 bmp] 20 bmp Vt Set:  [500 mL] 500 mL PEEP:  [5 cmH20] 5 cmH20 Plateau Pressure:   [17 cmH20] 17 cmH20   Intake/Output Summary (Last 24 hours) at 04/26/2024 0743 Last data filed at 04/26/2024 0641 Gross per 24 hour  Intake 716.05 ml  Output 275 ml  Net 441.05 ml   Filed Weights   04/25/24 1630 04/25/24 2230 04/26/24 0415  Weight: 112.1 kg 108.4 kg 108.4 kg     REVIEW OF SYSTEMS  PATIENT IS UNABLE TO PROVIDE COMPLETE REVIEW OF SYSTEMS DUE TO SEVERE CRITICAL ILLNESS   PHYSICAL EXAMINATION:  GENERAL:critically ill appearing, +resp distress EYES: Pupils equal, round, reactive to light.  No scleral icterus.  MOUTH: Moist mucosal membrane. INTUBATED NECK: Supple.  PULMONARY: Lungs clear to auscultation, +rhonchi, +wheezing CARDIOVASCULAR: S1 and S2.  Regular rate and rhythm GASTROINTESTINAL: Soft, nontender, -distended. Positive bowel sounds.  MUSCULOSKELETAL: No swelling, clubbing, or edema.  NEUROLOGIC: obtunded,sedated   Assessment and Plan   59 yo morbidly obese white male with ESRD on HD with acute cardiac arrest from NSTEMI with underlying CABG stressed from first session of HD, leading to prolonged downtime and physical exam and findings concerning for brain damage  Severe ACUTE Hypoxic and Hypercapnic Respiratory Failure -continue Mechanical Ventilator support -Wean Fio2 and PEEP as tolerated -VAP/VENT bundle implementation - Wean PEEP & FiO2 as tolerated, maintain SpO2 > 88% - Head of bed elevated 30 degrees, VAP protocol in place - Plateau pressures less than 30 cm H20  - Intermittent chest x-ray & ABG PRN - Ensure adequate pulmonary hygiene  -will perform SAT/SBT when respiratory parameters are met   NEUROLOGY ACUTE METABOLIC ENCEPHALOPATHY-concerns for anoxic brain damage Wean off sedation CT Head 01/12: No acute intracranial process. Progressive chronic sinus disease. - Correct metabolic derangements  - Avoid sedating medications as able  - Maintain RASS goal 0 to -1 - PAD protocol to maintain RASS goal: propofol  gtt  - WUA daily  -  Promote family presence at bedside  Plan for EEG Continue Keppra   Cardiac arrest (asystole) suspect secondary to hemodialysis  Cardiogenic shock  Elevated troponin suspect NSTEMI  #Severe lactic acidosis  Hx: CAD, HLD, heart murmur, MI, and PVD  - Continuous telemetry monitoring  - Trend troponin's until peaked  - Trend lactic acid until normalized  - Prn levophed  gtt to maintain map >65 - Heparin  gtt  - Echo pending    ESRD ON HD -continue Foley Catheter-assess need -Avoid nephrotoxic agents -Follow urine output, BMP -Ensure adequate renal perfusion, optimize oxygenation -Renal dose medications High risk for cardiac arrest with HD plan for CRRT in next 24 hrs as per nephrology   Intake/Output Summary (Last 24 hours) at 04/26/2024 0747 Last data filed at 04/26/2024 0641 Gross per 24 hour  Intake 716.05 ml  Output 275 ml  Net 441.05 ml  Transaminitis  - Trend hepatic function panel  - Avoid hepatotoxic agents as able   ELECTROLYTES -follow labs as needed -replace as needed -pharmacy consultation and following  ACUTE ANEMIA- TRANSFUSE AS NEEDED CONSIDER TRANSFUSION  IF HGB<7 DVT PRX with TED/SCD's ONLY    01/12: Pts family updated extensively by Dr. Isaiah regarding pts condition and current plan of care.  All questions answered  Labs   CBC: Recent Labs  Lab 04/25/24 1342 04/25/24 2304 04/26/24 0636  WBC 18.9* 15.1* 12.6*  HGB 10.6* 9.5* 9.6*  HCT 33.7* 28.0* 29.2*  MCV 112.3* 107.7* 107.4*  PLT 276 259 245    Basic Metabolic Panel: Recent Labs  Lab 04/25/24 1342 04/25/24 2055 04/26/24 0636  NA 129*  --  126*  K 4.6  --  4.1  CL 91*  --  87*  CO2 16*  --  23  GLUCOSE 175*  --  191*  BUN 49*  --  57*  CREATININE 8.70*  --  9.51*  CALCIUM  9.1  --  8.8*  MG  --  1.7 1.8  PHOS  --  6.4* 6.7*   GFR: Estimated Creatinine Clearance: 10.2 mL/min (A) (by C-G formula based on SCr of 9.51 mg/dL (H)). Recent Labs  Lab 04/25/24 1342  04/25/24 2055 04/25/24 2304 04/25/24 2328 04/26/24 0338 04/26/24 0636  WBC 18.9*  --  15.1*  --   --  12.6*  LATICACIDVEN 8.1* 5.2*  --  2.3* 1.8  --     Liver Function Tests: Recent Labs  Lab 04/25/24 1342 04/26/24 0636  AST 290* 113*  ALT 163* 109*  ALKPHOS 245* 223*  BILITOT 0.5 0.3  PROT 6.8 6.5  ALBUMIN  2.8* 2.8*   No results for input(s): LIPASE, AMYLASE in the last 168 hours. No results for input(s): AMMONIA in the last 168 hours.  ABG    Component Value Date/Time   PHART 7.32 (L) 04/25/2024 1430   PCO2ART 45 04/25/2024 1430   PO2ART 140 (H) 04/25/2024 1430   HCO3 23.2 04/25/2024 1430   TCO2 27 07/26/2023 1842   ACIDBASEDEF 3.1 (H) 04/25/2024 1430   O2SAT 99.7 04/25/2024 1430     Coagulation Profile: Recent Labs  Lab 04/25/24 2055  INR 1.6*    Cardiac Enzymes: No results for input(s): CKTOTAL, CKMB, CKMBINDEX, TROPONINI in the last 168 hours.  HbA1C: Hgb A1c MFr Bld  Date/Time Value Ref Range Status  04/25/2024 06:09 PM 6.2 (H) 4.8 - 5.6 % Final    Comment:    (NOTE) Diagnosis of Diabetes The following HbA1c ranges recommended by the American Diabetes Association (ADA) may be used as an aid in the diagnosis of diabetes mellitus.  Hemoglobin             Suggested A1C NGSP%              Diagnosis  <5.7                   Non Diabetic  5.7-6.4                Pre-Diabetic  >6.4                   Diabetic  <7.0                   Glycemic control for                       adults with diabetes.  07/26/2023 08:25 PM 4.9 4.8 - 5.6 % Final    Comment:    (NOTE) Pre diabetes:          5.7%-6.4%  Diabetes:              >6.4%  Glycemic control for   <7.0% adults with diabetes     CBG: Recent Labs  Lab 04/21/24 1342 04/25/24 1340 04/25/24 2004 04/26/24 0019 04/26/24 0316  GLUCAP 128* 176* 244* 219* 185*    Review of Systems:   Unable to assess pt mechanically intubated   Past Medical History:  He,  has a past  medical history of Anemia, Arthritis, Asthma, Atypical mole (09/22/2022), Barrett's esophagus (02/16/2014), Cataract, Coronary artery disease, DDD (degenerative disc disease), cervical, Diabetes mellitus without complication (HCC), Diabetic osteomyelitis (HCC), Diabetic peripheral neuropathy (HCC), Diabetic ulcer of foot with muscle involvement without evidence of necrosis (HCC), Diverticulosis, DKA (diabetic ketoacidoses) (08/23/2017), Edema, lower extremity, ESRD on hemodialysis (HCC), Fatty liver, Gastroparesis (2015), GERD (gastroesophageal reflux disease), Gilbert's disease, Glaucoma, BKA, left (HCC), CABG (01/27/2019), heart artery stent (11/20/2009), Hyperlipidemia, Hypertension, Left below-knee amputee (HCC), Legally blind, Myocardial infarction (HCC) (11/2009), Neuropathy, Osteoarthritis of spine, Osteomyelitis of left foot (HCC), Peritoneal dialysis catheter in place, PONV (postoperative nausea and vomiting), PVD (peripheral vascular disease), Seasonal allergies, Shoulder pain, Tubular adenoma of colon, and Ulcer of foot (HCC) (11/17/2012).   Surgical History:   Past Surgical History:  Procedure Laterality Date   ABDOMINAL AORTOGRAM N/A 05/20/2016   Procedure: Abdominal Aortogram possible intervention;  Surgeon: Cordella KANDICE Shawl, MD;  Location: [facility] INVASIVE CV LAB;  Service: Cardiovascular;  Laterality: N/A;   AMPUTATION Left 09/05/2017   Procedure: AMPUTATION BELOW KNEE;  Surgeon: Dozier Soulier, MD;  Location: [facility] OR;  Service: Orthopedics;  Laterality: Left;   ANTERIOR CERVICAL DECOMP/DISCECTOMY FUSION N/A 05/07/2017   Procedure: ANTERIOR CERVICAL DECOMPRESSION/DISCECTOMY CORTEZ REDHEAD PROSTHESIS,PLATE/SCREWS CERVICAL FIVE - CERVICAL SIX;  Surgeon: Mavis Purchase, MD;  Location: [facility] OR;  Service: Neurosurgery;  Laterality: N/A;   CAPD INSERTION N/A 08/30/2020   Procedure: LAPAROSCOPIC INSERTION CONTINUOUS AMBULATORY PERITONEAL DIALYSIS  (CAPD) CATHETER;  Surgeon: Desiderio Schanz, MD;   Location: [facility] ORS;  Service: General;  Laterality: N/A;   CATARACT EXTRACTION W/ INTRAOCULAR LENS IMPLANT     CATARACT EXTRACTION W/PHACO Left 02/26/2017   Procedure: CATARACT EXTRACTION PHACO AND INTRAOCULAR LENS PLACEMENT (IOC);  Surgeon: Myrna Adine Anes, MD;  Location: [facility] ORS;  Service: Ophthalmology;  Laterality: Left;  Lot # [id number] H US : 00:25.3 AP%: 6.2 CDE: 1.59    CHOLECYSTECTOMY N/A    COLONOSCOPY WITH PROPOFOL  N/A 05/30/2021   Procedure: COLONOSCOPY WITH PROPOFOL ;  Surgeon: Albertus Gordy HERO, MD;  Location: [facility]OSCOPY;  Service: Gastroenterology;  Laterality: N/A;   CORONARY ANGIOPLASTY WITH STENT PLACEMENT  11/20/2009   80% D1 stenosis - 2.5 x 18 mm Xience V Everolimus (DES) stent x 1 placed; LOCATION: [facility]; SURGEON: Vinie Jude, MD   CORONARY ARTERY BYPASS GRAFT N/A 01/27/2019   Procedure: OFF PUMP CORONARY ARTERY BYPASS GRAFTING (CABG) X 2 WITH ENDOSCOPIC HARVESTING OF RIGHT GREATER SAPHENOUS VEIN. LIMA TO LAD;  Surgeon: Shyrl Linnie KIDD, MD;  Location: [facility] OR;  Service: Open Heart Surgery;  Laterality: N/A;   CORONARY STENT INTERVENTION N/A 01/21/2019   Procedure: CORONARY STENT INTERVENTION;  Surgeon: Claudene Victory ORN, MD;  Location: [facility] INVAS[facility] CV LAB;  Service: Cardiovascular;  Laterality: N/A;   DIALYSIS/PERMA CATHETER INSERTION N/A 04/21/2024   Procedure: DIALYSIS/PERMA CATHETER INSERTION;  Surgeon: Marea Selinda RAMAN, MD;  Location: [facility] INVASIVE CV  LAB;  Service: Cardiovascular;  Laterality: N/A;   EPIBLEPHERON REPAIR WITH TEAR DUCT PROBING     EYE SURGERY Right 02/09/2014   cataract extraction   INSERTION EXPRESS TUBE SHUNT Right 09/06/2015   Procedure: INSERTION AHMED TUBE SHUNT with tutoplast allograft;  Surgeon: Adine Oneil Novak, MD;  Location: [facility] ORS;  Service: Ophthalmology;  Laterality: Right;   laparoscopic insertion continuous peritoneal dialysis  2022   LEFT HEART CATH AND CORONARY ANGIOGRAPHY N/A 01/20/2019   Procedure: LEFT HEART CATH AND CORONARY  ANGIOGRAPHY;  Surgeon: Court Dorn PARAS, MD;  Location: [facility];  Service: Cardiovascular;  Laterality: N/A;   POLYPECTOMY  05/30/2021   Procedure: POLYPECTOMY;  Surgeon: Albertus Gordy HERO, MD;  Location: [facility];  Service: Gastroenterology;;   TEE WITHOUT CARDIOVERSION N/A 01/27/2019   Procedure: TRANSESOPHAGEAL ECHOCARDIOGRAM (TEE);  Surgeon: Shyrl Linnie KIDD, MD;  Location: [facility] OR;  Service: Open Heart Surgery;  Laterality: N/A;   VASECTOMY       Social History:   reports that he quit smoking about 16 years ago. His smoking use included cigarettes. He started smoking about 36 years ago. He has a 10 pack-year smoking history. He has never used smokeless tobacco. He reports that he does not currently use alcohol . He reports that he does not use drugs.   Family History:  His family history includes Diabetes in his father and mother; Heart attack in his father; Hemochromatosis in an other family member; Hyperlipidemia in his father and mother; Hypertension in his father; Liver disease in his mother; Lymphoma in his father; Other in his mother; Polycythemia in an other family member. There is no history of Colon cancer, Esophageal cancer, Rectal cancer, or Stomach cancer.   Allergies Allergies[1]   Home Medications  Prior to Admission medications  Medication Sig Start Date End Date Taking? Authorizing Provider  acetaminophen  (TYLENOL ) 325 MG tablet Take 1 tablet (325 mg total) by mouth every 6 (six) hours as needed for mild pain (pain score 1-3 or temp > 100.5). 09/06/17  Yes Laliberte, Danielle, PA-C  amLODipine  (NORVASC ) 10 MG tablet Take 0.5 tablets (5 mg total) by mouth daily. 08/30/23  Yes Regalado, Belkys A, MD  ammonium lactate  (AMLACTIN) 12 % cream Apply topically to feet, toes and thickened areas of skin at arms and legs bid 11/19/23  Yes Jackquline Sawyer, MD  aspirin  (ASPIRIN  CHILDRENS) 81 MG chewable tablet Chew 1 tablet (81 mg total) by mouth daily. 03/04/19  Yes Lightfoot,  Linnie KIDD, MD  brimonidine -timolol  (COMBIGAN ) 0.2-0.5 % ophthalmic solution Place 1 drop into both eyes in the morning and at bedtime.   Yes [provider]  Cholecalciferol  (VITAMIN D3) 1000 units CAPS Take 1,000 Units by mouth in the morning.   Yes [provider]  clobetasol  cream (TEMOVATE ) 0.05 % Apply topically to affected area of thickness at arms and legs qd/bid prn Avoid applying to face, groin, and axilla. Use as directed. Use up to 4 weeks 11/19/23  Yes Jackquline Sawyer, MD  dorzolamide  (TRUSOPT ) 2 % ophthalmic solution Place 1 drop into both eyes 2 (two) times daily. 10/18/19  Yes [provider]  ezetimibe  (ZETIA ) 10 MG tablet TAKE 1 TABLET BY MOUTH EVERYDAY AT BEDTIME 11/02/23  Yes Vincente Saber, NP  FLOMAX  0.4 MG CAPS capsule Take 0.4 mg by mouth at bedtime. 04/03/21  Yes [provider]  fluticasone  (FLONASE ) 50 MCG/ACT nasal spray SPRAY 2 SPRAYS INTO EACH NOSTRIL EVERY DAY 11/14/19  Yes Maribeth Camellia MATSU, MD  gabapentin  (  NEURONTIN ) 300 MG capsule Take 1 capsule (300 mg total) by mouth 2 (two) times daily. 07/30/23  Yes Pokhrel, Laxman, MD  gentamicin  cream (GARAMYCIN ) 0.1 % Apply 1 application  topically See admin instructions. Apply around port site as directed 04/05/21  Yes [provider]  lactulose  (CHRONULAC ) 10 GM/15ML solution TAKE 30 MLS (20 G TOTAL) BY MOUTH AS NEEDED. 11/02/20  Yes Pyrtle, Gordy HERO, MD  latanoprost  (XALATAN ) 0.005 % ophthalmic solution Place 1 drop into both eyes in the morning.   Yes [provider]  loratadine  (CLARITIN ) 10 MG tablet TAKE 1 TABLET BY MOUTH EVERY DAY 04/18/24  Yes Kaur, Charanpreet, NP  metoCLOPramide  (REGLAN ) 10 MG tablet TAKE 1 TABLET (10 MG TOTAL) BY MOUTH AT BEDTIME. PLEASE KEEP YOUR DECEMBER APPOINTMENT FOR FURTHER REFILLS. 04/22/24  Yes McMichael, Bayley M, PA-C  midodrine  (PROAMATINE ) 5 MG tablet Take 5 mg by mouth 2 (two) times daily as needed (if the Systolic number is less than 100). 09/27/20   Yes [provider]  mometasone  (ELOCON ) 0.1 % cream Apply 1 Application topically as directed. Qd to bid to aa right ankle, right lower leg until improved, then prn flares Patient taking differently: Apply 1 Application topically See admin instructions. Apply to affected area of the ankle and lower leg 1-2 times a day until improvement is seen, then as needed for flares 03/30/23  Yes Stewart, Tara, MD  Multiple Vitamin (MULTIVITAMIN ADULT PO) Take 1 tablet by mouth daily.   Yes [provider]  ondansetron  (ZOFRAN -ODT) 4 MG disintegrating tablet Take 4 mg by mouth every 8 (eight) hours as needed for nausea or vomiting (dissolve orally).   Yes [provider]  pantoprazole  (PROTONIX ) 40 MG tablet TAKE 1 TABLET BY MOUTH TWICE A DAY 10/09/23  Yes Kaur, Charanpreet, NP  PARoxetine (PAXIL) 10 MG tablet Take 10 mg by mouth every morning.   Yes [provider]  Potassium Chloride  CR (MICRO-K ) 8 MEQ CPCR capsule CR Take 1 capsule by mouth 2 (two) times daily. 03/11/24  Yes [provider]  torsemide  (DEMADEX ) 20 MG tablet Take 40 mg by mouth 2 (two) times daily. At breakfast and lunchtime   Yes [provider]  Vitamin D , Ergocalciferol , (DRISDOL) 1.25 MG (50000 UNIT) CAPS capsule Take 50,000 Units by mouth every 14 (fourteen) days.   Yes [provider]  atorvastatin  (LIPITOR ) 80 MG tablet Take 1 tablet (80 mg total) by mouth at bedtime. Patient not taking: Reported on 04/21/2024 08/06/23   Kaur, Charanpreet, NP  azelastine  (ASTELIN ) 0.1 % nasal spray Place 2 sprays into both nostrils 2 (two) times daily. Use in each nostril as directed Patient not taking: Reported on 04/25/2024 08/30/23   Regalado, Owen A, MD  Continuous Glucose Sensor (DEXCOM G7 SENSOR) MISC Change sensor every 10 days 09/08/23   Shamleffer, Ibtehal Jaralla, MD  Efinaconazole  (JUBLIA ) 10 % SOLN Apply topically to affected toenails at night for onychomycosis Patient not taking:  Reported on 04/25/2024 11/19/23   Jackquline Sawyer, MD  guaiFENesin  (MUCINEX ) 600 MG 12 hr tablet Take 1 tablet (600 mg total) by mouth 2 (two) times daily as needed for to loosen phlegm. Patient not taking: Reported on 04/21/2024 08/30/23   Regalado, Belkys A, MD  insulin  degludec (TRESIBA  FLEXTOUCH) 200 UNIT/ML FlexTouch Pen INJECT 42 UNITS INTO THE SKIN IN THE MORNING AND AT BEDTIME. 08/31/23   Shamleffer, Ibtehal Jaralla, MD  insulin  lispro (HUMALOG ) 100 UNIT/ML KwikPen MAX DAILY 45 UNITS 09/28/23   Shamleffer, Ibtehal Jaralla,  MD  Insulin  NPH, Human,, Isophane, (HUMULIN  N KWIKPEN) 100 UNIT/ML Kiwkpen INJECT 70 UNITS INTO THE SKIN DAILY IN THE AFTERNOON. TO BE TAKEN AT THE START OF DIALYSIS 01/04/24   Shamleffer, Donell Cardinal, MD  KLOR-CON  M20 20 MEQ tablet Take 20 mEq by mouth in the morning. Patient not taking: Reported on 04/25/2024    [provider]  senna (SENOKOT) 8.6 MG tablet Take 2 tablets by mouth in the morning. Patient not taking: Reported on 04/25/2024    [provider]  sevelamer  carbonate (RENVELA ) 800 MG tablet Take 1 tablet (800 mg total) by mouth 3 (three) times daily with meals. Patient not taking: Reported on 04/21/2024 08/30/23   Regalado, Belkys A, MD  terbinafine  (LAMISIL ) 250 MG tablet Take 1 tablet daily Patient not taking: Reported on 04/21/2024 11/23/23   Jackquline Sawyer, MD  Testosterone  20.25 MG/ACT (1.62%) GEL Place 2 Pump onto the skin daily in the afternoon. Patient not taking: Reported on 04/25/2024 05/15/23   Shamleffer, Ibtehal Jaralla, MD      DVT/GI PRX  assessed I Assessed the need for Labs I Assessed the need for Foley I Assessed the need for Central Venous Line Family Discussion when available I Assessed the need for Mobilization I made an Assessment of medications to be adjusted accordingly Safety Risk assessment completed  CASE DISCUSSED IN MULTIDISCIPLINARY ROUNDS WITH ICU TEAM     Critical Care Time devoted to patient care services  described in this note is 65 minutes.  Critical care was necessary to treat /prevent imminent and life-threatening deterioration. Overall, patient is critically ill, prognosis is guarded.  Patient with Multiorgan failure and at high risk for cardiac arrest and death.    Nickolas Alm Cellar, M.D.  [facility]  Medical Director [facility]           [1]  Allergies Allergen Reactions   Baclofen Other (See Comments)    Disorientation, Contraindication with dialysis    Nsaids Other (See Comments)    CKD on dialysis    "

## 2024-04-26 NOTE — Progress Notes (Signed)
 PHARMACY CONSULT NOTE - ELECTROLYTES  Pharmacy Consult for Electrolyte Monitoring and Replacement   Recent Labs: Height: 5' 10 (177.8 cm) Weight: 108.4 kg (238 lb 15.7 oz) IBW/kg (Calculated) : 73 Estimated Creatinine Clearance: 10.2 mL/min (A) (by C-G formula based on SCr of 9.51 mg/dL (H)). Potassium (mmol/L)  Date Value  04/26/2024 4.1  12/29/2013 4.5   Magnesium  (mg/dL)  Date Value  98/86/7973 1.8   Calcium  (mg/dL)  Date Value  98/86/7973 8.8 (L)   Albumin  (g/dL)  Date Value  98/86/7973 2.8 (L)   Phosphorus (mg/dL)  Date Value  98/86/7973 6.7 (H)   Sodium (mmol/L)  Date Value  04/26/2024 126 (L)  02/14/2019 140   Assessment  Chase Clark is a 60 y.o. male presenting with cardiac arrest. PMH significant for ESRD on dialysis. Pharmacy has been consulted to monitor and replace electrolytes.  Diet: NPO MIVF: none Pertinent medications: none  Goal of Therapy: Electrolytes WNL  Plan:  Mg 1.8, magnesium  sulfate 2 g IV x 1 Check BMP, Mg with AM labs  Thank you for allowing pharmacy to be a part of this patient's care.  Larissa Pegg B Travonte Byard 04/26/2024 7:45 AM

## 2024-04-26 NOTE — Progress Notes (Signed)
 HHD pt with outpt at [facility]. Navigator following to assist with any HD needs.   Suzen Satchel Dialysis Navigator [phone]

## 2024-04-27 ENCOUNTER — Inpatient Hospital Stay

## 2024-04-27 DIAGNOSIS — I255 Ischemic cardiomyopathy: Secondary | ICD-10-CM

## 2024-04-27 DIAGNOSIS — I509 Heart failure, unspecified: Secondary | ICD-10-CM | POA: Diagnosis not present

## 2024-04-27 DIAGNOSIS — E1122 Type 2 diabetes mellitus with diabetic chronic kidney disease: Secondary | ICD-10-CM

## 2024-04-27 DIAGNOSIS — I469 Cardiac arrest, cause unspecified: Secondary | ICD-10-CM

## 2024-04-27 DIAGNOSIS — I132 Hypertensive heart and chronic kidney disease with heart failure and with stage 5 chronic kidney disease, or end stage renal disease: Secondary | ICD-10-CM | POA: Diagnosis not present

## 2024-04-27 DIAGNOSIS — I214 Non-ST elevation (NSTEMI) myocardial infarction: Secondary | ICD-10-CM | POA: Diagnosis not present

## 2024-04-27 DIAGNOSIS — J9601 Acute respiratory failure with hypoxia: Secondary | ICD-10-CM | POA: Diagnosis not present

## 2024-04-27 DIAGNOSIS — J9602 Acute respiratory failure with hypercapnia: Secondary | ICD-10-CM | POA: Diagnosis not present

## 2024-04-27 DIAGNOSIS — G9340 Encephalopathy, unspecified: Secondary | ICD-10-CM | POA: Diagnosis not present

## 2024-04-27 DIAGNOSIS — G4089 Other seizures: Secondary | ICD-10-CM

## 2024-04-27 DIAGNOSIS — Z992 Dependence on renal dialysis: Secondary | ICD-10-CM | POA: Diagnosis not present

## 2024-04-27 DIAGNOSIS — N186 End stage renal disease: Secondary | ICD-10-CM | POA: Diagnosis not present

## 2024-04-27 DIAGNOSIS — N189 Chronic kidney disease, unspecified: Secondary | ICD-10-CM

## 2024-04-27 LAB — BASIC METABOLIC PANEL WITH GFR
Anion gap: 17 — ABNORMAL HIGH (ref 5–15)
BUN: 73 mg/dL — ABNORMAL HIGH (ref 6–20)
CO2: 22 mmol/L (ref 22–32)
Calcium: 8.6 mg/dL — ABNORMAL LOW (ref 8.9–10.3)
Chloride: 90 mmol/L — ABNORMAL LOW (ref 98–111)
Creatinine, Ser: 11.5 mg/dL — ABNORMAL HIGH (ref 0.61–1.24)
GFR, Estimated: 5 mL/min — ABNORMAL LOW
Glucose, Bld: 198 mg/dL — ABNORMAL HIGH (ref 70–99)
Potassium: 4.2 mmol/L (ref 3.5–5.1)
Sodium: 128 mmol/L — ABNORMAL LOW (ref 135–145)

## 2024-04-27 LAB — CBC
HCT: 25.7 % — ABNORMAL LOW (ref 39.0–52.0)
Hemoglobin: 8.3 g/dL — ABNORMAL LOW (ref 13.0–17.0)
MCH: 34.4 pg — ABNORMAL HIGH (ref 26.0–34.0)
MCHC: 32.3 g/dL (ref 30.0–36.0)
MCV: 106.6 fL — ABNORMAL HIGH (ref 80.0–100.0)
Platelets: 225 K/uL (ref 150–400)
RBC: 2.41 MIL/uL — ABNORMAL LOW (ref 4.22–5.81)
RDW: 13.9 % (ref 11.5–15.5)
WBC: 12.9 K/uL — ABNORMAL HIGH (ref 4.0–10.5)
nRBC: 0 % (ref 0.0–0.2)

## 2024-04-27 LAB — HEPARIN LEVEL (UNFRACTIONATED)
Heparin Unfractionated: 0.3 [IU]/mL (ref 0.30–0.70)
Heparin Unfractionated: 0.38 [IU]/mL (ref 0.30–0.70)

## 2024-04-27 LAB — GLUCOSE, CAPILLARY
Glucose-Capillary: 190 mg/dL — ABNORMAL HIGH (ref 70–99)
Glucose-Capillary: 191 mg/dL — ABNORMAL HIGH (ref 70–99)
Glucose-Capillary: 262 mg/dL — ABNORMAL HIGH (ref 70–99)
Glucose-Capillary: 248 mg/dL — ABNORMAL HIGH (ref 70–99)
Glucose-Capillary: 251 mg/dL — ABNORMAL HIGH (ref 70–99)

## 2024-04-27 LAB — RENAL FUNCTION PANEL
Albumin: 2.8 g/dL — ABNORMAL LOW (ref 3.5–5.0)
Anion gap: 19 — ABNORMAL HIGH (ref 5–15)
BUN: 89 mg/dL — ABNORMAL HIGH (ref 6–20)
CO2: 21 mmol/L — ABNORMAL LOW (ref 22–32)
Calcium: 8.4 mg/dL — ABNORMAL LOW (ref 8.9–10.3)
Chloride: 88 mmol/L — ABNORMAL LOW (ref 98–111)
Creatinine, Ser: 12.3 mg/dL — ABNORMAL HIGH (ref 0.61–1.24)
GFR, Estimated: 4 mL/min — ABNORMAL LOW
Glucose, Bld: 277 mg/dL — ABNORMAL HIGH (ref 70–99)
Phosphorus: 7.5 mg/dL — ABNORMAL HIGH (ref 2.5–4.6)
Potassium: 4.9 mmol/L (ref 3.5–5.1)
Sodium: 128 mmol/L — ABNORMAL LOW (ref 135–145)

## 2024-04-27 LAB — FOLATE: Folate: 14.9 ng/mL

## 2024-04-27 LAB — PHOSPHORUS: Phosphorus: 6.9 mg/dL — ABNORMAL HIGH (ref 2.5–4.6)

## 2024-04-27 LAB — MAGNESIUM: Magnesium: 2.4 mg/dL (ref 1.7–2.4)

## 2024-04-27 LAB — VITAMIN B12: Vitamin B-12: 722 pg/mL (ref 180–914)

## 2024-04-27 LAB — VITAMIN D 25 HYDROXY (VIT D DEFICIENCY, FRACTURES): Vit D, 25-Hydroxy: 22.4 ng/mL — ABNORMAL LOW (ref 30–100)

## 2024-04-27 MED ORDER — PRISMASOL BGK 4/2.5 32-4-2.5 MEQ/L EC SOLN: Status: DC

## 2024-04-27 MED ORDER — LEVETIRACETAM (KEPPRA) 500 MG/5 ML ADULT IV PUSH
1000.0000 mg | INTRAVENOUS | Status: DC
  Filled 2024-04-27: qty 10

## 2024-04-27 MED ORDER — FENTANYL CITRATE (PF) 100 MCG/2ML IJ SOLN
100.0000 ug | Freq: Once | INTRAMUSCULAR | Status: AC
  Administered 2024-04-27: 100 ug via INTRAVENOUS
  Filled 2024-04-27: qty 2

## 2024-04-27 MED ORDER — HEPARIN SODIUM (PORCINE) 1000 UNIT/ML DIALYSIS: 1000.0000 [IU] | INTRAMUSCULAR | Status: DC | PRN

## 2024-04-27 MED ORDER — HYDRALAZINE HCL 10 MG PO TABS
10.0000 mg | ORAL_TABLET | Freq: Three times a day (TID) | ORAL | Status: DC
  Filled 2024-04-27 (×4): qty 1

## 2024-04-27 MED ORDER — LEVETIRACETAM (KEPPRA) 500 MG/5 ML ADULT IV PUSH
2000.0000 mg | Freq: Once | INTRAVENOUS | Status: AC
  Administered 2024-04-27: 2000 mg via INTRAVENOUS
  Filled 2024-04-27: qty 20

## 2024-04-27 MED ORDER — CHOLECALCIFEROL 10 MCG/ML (400 UNIT/ML) PO LIQD
2000.0000 [IU] | Freq: Every day | ORAL | Status: DC
  Administered 2024-04-28: 2000 [IU]
  Filled 2024-04-27: qty 5

## 2024-04-27 MED ORDER — ISOSORBIDE DINITRATE 10 MG PO TABS
5.0000 mg | ORAL_TABLET | Freq: Three times a day (TID) | ORAL | Status: DC
  Administered 2024-04-28: 5 mg via ORAL
  Filled 2024-04-27 (×4): qty 0.5

## 2024-04-27 MED ORDER — MIDAZOLAM HCL (PF) 2 MG/2ML IJ SOLN
4.0000 mg | INTRAMUSCULAR | Status: DC | PRN
  Administered 2024-04-27: 2 mg via INTRAVENOUS
  Filled 2024-04-27: qty 4

## 2024-04-27 NOTE — IPAL (Signed)
" °  Interdisciplinary Goals of Care Family Meeting   Date carried out: 04/27/2024  Location of the meeting: Bedside  Member's involved: Physician, Bedside Registered Nurse, and Family Member or next of kin    GOALS OF CARE DISCUSSION  The Clinical status was relayed to family in detail- Wife, Sister, 2 Daughters  Updated and notified of patients medical condition- Patient remains unresponsive and will not open eyes to command.   Patient with increased WOB and using accessory muscles to breathe Explained to family course of therapy and the modalities    Family understands the situation.  EEG 1/13 +SEIZURES EEG 1/14 NO SEIZURES MRI BRAIN NO SIGNIFICANT FINDINGS  Findings reviewed with family in detail Plan for wake up assessment on May 11, 2024  Plan for CRRT 05/11/24   They have consented and agreed to DNR status  Family are satisfied with Plan of action and management. All questions answered   Additional CC time 35 mins   Lanna Labella Alm Cellar, M.D.  [facility]  Medical Director [facility] Medical Director [facility] Cardio-[facility]     "

## 2024-04-27 NOTE — Consult Note (Addendum)
 Neurology Consultation Note  Consult Requested by: Dr. Isaiah  Reason for Consult: myoclonic seizure  Consult Date: 04/27/2024   The history was obtained from the daughters.  During history and examination, all items were able to obtain unless otherwise noted.  History of Present Illness:  Chase Clark is a 59 y.o. Caucasian male with PMH of ESRD on dialysis, CAD status post CABG in 2010, CHF, hypertension, hyperlipidemia, diabetes, PAD status post left BKA, bilateral legally blind admitted postcardiac arrest on 1/12.  Cardia arrested 45 minutes.  Intubated and admitted to ICU.  Cardiology consulted, peak troponin 7000, considered non-STEMI, on heparin  IV.  After admission, patient was found to have whole body intermittent myoclonic jerking movement, EEG showed generalized myoclonic seizures.  However, jerking movement subsided around 8 PM last night before EEG reported.  AEDs not initiated.  Today no myoclonic jerking movement, repeat EEG no seizure but generalized spikes.  MRI showed no significant anoxic brain injury.  Neurology consulted.   Past Medical History:  Diagnosis Date   Anemia    Arthritis    Asthma    as child   Atypical mole 09/22/2022   R spinal mid back, needs excision   Barrett's esophagus 02/16/2014   short segment   Cataract    Coronary artery disease    DDD (degenerative disc disease), cervical    Diabetes mellitus without complication (HCC)    Diabetic osteomyelitis (HCC)    Diabetic peripheral neuropathy (HCC)    Diabetic ulcer of foot with muscle involvement without evidence of necrosis (HCC)    DIABETIC ULCERATIONS ASSOCIATED WITH IRRITATION LATERAL ANKLE LEFT GREATER THAN RIGHT WITH MILD CELLULITIS    Diverticulosis    DKA (diabetic ketoacidoses) 08/23/2017   Edema, lower extremity    ESRD on hemodialysis (HCC)    Fatty liver    Gastroparesis 2015   GERD (gastroesophageal reflux disease)    Gilbert's disease    Glaucoma    Hx of BKA, left (HCC)     Hx of CABG 01/27/2019   2 vessels; LIMA-LAD, SVG-D1   Hx of heart artery stent 11/20/2009   80% D1 stenosis - 2.5 x 18 mm Xience V Everolimus (DES) stent x 1 placed   Hyperlipidemia    Hypertension    Left below-knee amputee (HCC)    Legally blind    Myocardial infarction (HCC) 11/2009   Neuropathy    Osteoarthritis of spine    Osteomyelitis of left foot (HCC)    Peritoneal dialysis catheter in place    Does dialysis over night.   PONV (postoperative nausea and vomiting)    PVD (peripheral vascular disease)    Seasonal allergies    Shoulder pain    Tubular adenoma of colon    Ulcer of foot (HCC) 11/17/2012   DIABETIC ULCERATIONS ASSOCIATED WITH IRRITATION LATERAL ANKLE LEFT GREATER THAN RIGHT WITH MILD CELLULITIS    Past Surgical History:  Procedure Laterality Date   ABDOMINAL AORTOGRAM N/A 05/20/2016   Procedure: Abdominal Aortogram possible intervention;  Surgeon: Cordella KANDICE Shawl, MD;  Location: [facility];  Service: Cardiovascular;  Laterality: N/A;   AMPUTATION Left 09/05/2017   Procedure: AMPUTATION BELOW KNEE;  Surgeon: Dozier Soulier, MD;  Location: [facility] OR;  Service: Orthopedics;  Laterality: Left;   ANTERIOR CERVICAL DECOMP/DISCECTOMY FUSION N/A 05/07/2017   Procedure: ANTERIOR CERVICAL DECOMPRESSION/DISCECTOMY CORTEZ REDHEAD PROSTHESIS,PLATE/SCREWS CERVICAL FIVE - CERVICAL SIX;  Surgeon: Mavis Purchase, MD;  Location: [facility] OR;  Service: Neurosurgery;  Laterality: N/A;   CAPD  INSERTION N/A 08/30/2020   Procedure: LAPAROSCOPIC INSERTION CONTINUOUS AMBULATORY PERITONEAL DIALYSIS  (CAPD) CATHETER;  Surgeon: Desiderio Schanz, MD;  Location: [facility] ORS;  Service: General;  Laterality: N/A;   CATARACT EXTRACTION W/ INTRAOCULAR LENS IMPLANT     CATARACT EXTRACTION W/PHACO Left 02/26/2017   Procedure: CATARACT EXTRACTION PHACO AND INTRAOCULAR LENS PLACEMENT (IOC);  Surgeon: Myrna Adine Anes, MD;  Location: [facility] ORS;  Service: Ophthalmology;  Laterality: Left;  Lot #  [id number] H US : 00:25.3 AP%: 6.2 CDE: 1.59    CHOLECYSTECTOMY N/A    COLONOSCOPY WITH PROPOFOL  N/A 05/30/2021   Procedure: COLONOSCOPY WITH PROPOFOL ;  Surgeon: Albertus Gordy HERO, MD;  Location: [facility];  Service: Gastroenterology;  Laterality: N/A;   CORONARY ANGIOPLASTY WITH STENT PLACEMENT  11/20/2009   80% D1 stenosis - 2.5 x 18 mm Xience V Everolimus (DES) stent x 1 placed; LOCATION: [facility]; SURGEON: Vinie Jude, MD   CORONARY ARTERY BYPASS GRAFT N/A 01/27/2019   Procedure: OFF PUMP CORONARY ARTERY BYPASS GRAFTING (CABG) X 2 WITH ENDOSCOPIC HARVESTING OF RIGHT GREATER SAPHENOUS VEIN. LIMA TO LAD;  Surgeon: Shyrl Linnie KIDD, MD;  Location: [facility] OR;  Service: Open Heart Surgery;  Laterality: N/A;   CORONARY STENT INTERVENTION N/A 01/21/2019   Procedure: CORONARY STENT INTERVENTION;  Surgeon: Claudene Victory ORN, MD;  Location: [facility];  Service: Cardiovascular;  Laterality: N/A;   DIALYSIS/PERMA CATHETER INSERTION N/A 04/21/2024   Procedure: DIALYSIS/PERMA CATHETER INSERTION;  Surgeon: Marea Selinda RAMAN, MD;  Location: [facility];  Service: Cardiovascular;  Laterality: N/A;   EPIBLEPHERON REPAIR WITH TEAR DUCT PROBING     EYE SURGERY Right 02/09/2014   cataract extraction   INSERTION EXPRESS TUBE SHUNT Right 09/06/2015   Procedure: INSERTION AHMED TUBE SHUNT with tutoplast allograft;  Surgeon: Adine Anes Myrna, MD;  Location: [facility] ORS;  Service: Ophthalmology;  Laterality: Right;   laparoscopic insertion continuous peritoneal dialysis  2022   LEFT HEART CATH AND CORONARY ANGIOGRAPHY N/A 01/20/2019   Procedure: LEFT HEART CATH AND CORONARY ANGIOGRAPHY;  Surgeon: Court Dorn PARAS, MD;  Location: [facility];  Service: Cardiovascular;  Laterality: N/A;   POLYPECTOMY  05/30/2021   Procedure: POLYPECTOMY;  Surgeon: Albertus Gordy HERO, MD;  Location: [facility];  Service: Gastroenterology;;   TEE WITHOUT CARDIOVERSION N/A 01/27/2019   Procedure: TRANSESOPHAGEAL ECHOCARDIOGRAM  (TEE);  Surgeon: Shyrl Linnie KIDD, MD;  [facility] OR;  Service: Open Heart Surgery;  Laterality: N/A;   VASECTOMY      Family History  Problem Relation Age of Onset   Hyperlipidemia Mother    Diabetes Mother    Liver disease Mother        NASH   Other Mother        immune thrombocytopenic pupura (ITP)   Heart attack Father    Hyperlipidemia Father    Diabetes Father    Hypertension Father    Lymphoma Father    Hemochromatosis Other        Grandmother (? which side)   Polycythemia Other    Colon cancer Neg Hx    Esophageal cancer Neg Hx    Rectal cancer Neg Hx    Stomach cancer Neg Hx     Social History:  reports that he quit smoking about 16 years ago. His smoking use included cigarettes. He started smoking about 36 years ago. He has a 10 pack-year smoking history. He has never used smokeless tobacco. He reports that he does not currently use alcohol . He reports that he does  not use drugs.  Allergies: Allergies[1]  Medications Ordered Prior to Encounter[2]  Review of Systems: A full ROS was attempted today and was able to be performed.  Systems assessed include - Constitutional, Eyes, HENT, Respiratory, Cardiovascular, Gastrointestinal, Genitourinary, Integument/breast, Hematologic/lymphatic, Musculoskeletal, Neurological, Behavioral/Psych, Endocrine, Allergic/Immunologic - with pertinent responses as per HPI.  Physical Examination:  Temp:  [98.2 F (36.8 C)-100.6 F (38.1 C)] 99.5 F (37.5 C) (01/14 1500) Pulse Rate:  [69-83] 72 (01/14 1500) Resp:  [0-26] 20 (01/14 1500) BP: (91-146)/(53-86) 112/62 (01/14 1500) SpO2:  [91 %-100 %] 96 % (01/14 1500) FiO2 (%):  [35 %] 35 % (01/14 1215) Weight:  [110.1 kg] 110.1 kg (01/14 0500)  General - Well nourished, well developed, intubated on sedation.  Ophthalmologic - fundi not visualized due to noncooperation.  Cardiovascular - Regular rate and rhythm.  Neuro - intubated on versed , eyes closed, not following  commands. With forced eye opening, eyes in mid position, not blinking to visual threat, doll's eyes mildly present, not tracking, legally blink bilaterally at baseline, pupil irregular shape with previous surgeries and not reactive to light. Corneal reflex absent, gag and cough present. Breathing over the vent.  Facial symmetry not able to test due to ET tube.  Tongue protrusion not cooperative. On pain stimulation, no movement of all extremities. Sensation, coordination and gait not tested.    Data Reviewed: EEG adult Result Date: 04/27/2024 Shelton Arlin KIDD, MD     04/27/2024 11:18 AM Patient Name: Chase Clark MRN: [id number] Epilepsy Attending: Arlin KIDD Shelton Referring Physician/Provider: Isaiah Scrivener, MD Date: 04/27/2024 Duration: 40.23 mins Patient history: 59yo M s/p cardiac arrest. EEG to evaluate for seizure Level of alertness:  comatose AEDs during EEG study: Versed  Technical aspects: This EEG study was done with scalp electrodes positioned according to the 10-20 International system of electrode placement. Electrical activity was reviewed with band pass filter of 1-70Hz , sensitivity of 7 uV/mm, display speed of 75mm/sec with a 60Hz  notched filter applied as appropriate. EEG data were recorded continuously and digitally stored.  Video monitoring was available and reviewed as appropriate. Of note, report was delayed because it was assigned to a different physician yesterday Description: Patient was noted to have episodes of brief sudden whole body jerking every few seconds. Concomitant EEG showed generalized polyspikes consistent with myoclonic seizures.  In between seizures EEG showed generalized background suppression.  Briefly myoclonic seizures abated.  EEG then showed near continuous generalized 3-5hz  theta-delta slowing admixed with generalized periodic discharges with triphasic morphology at 1 Hz.  Hyperventilation and photic stimulation were not performed.   ABNORMALITY - Myoclonic  seizures, generalized - Periodic discharges with triphasic morphology, Generalized - Continuous Slow, Generalized IMPRESSION: Patient was noted to have episodes of whole body jerking intermittently during the study consistent with myoclonic seizures.  Additionally study was suggestive of generalized cerebral dysfunction (encephalopathy).  In the setting of cardiac arrest, this EEG pattern is concerning for anoxic/hypoxic brain injury. Please consider long-term EEG if clinically indicated.  Dr. Isaiah was notified. Arlin KIDD Shelton   ECHOCARDIOGRAM COMPLETE Result Date: 04/26/2024    ECHOCARDIOGRAM REPORT   Patient Name:   WADIE DELENA LOUDER Date of Exam: 04/25/2024 Medical Rec #:  [id number]       Height:       70.0 in Accession #:    [id number]      Weight:       247.1 lb Date of Birth:  05-19-1965       BSA:  2.284 m Patient Age:    58 years        BP:           128/77 mmHg Patient Gender: M               HR:           82 bpm. Exam Location:  [facility] Procedure: 2D Echo, Cardiac Doppler and Color Doppler (Both Spectral and Color            Flow Doppler were utilized during procedure). Indications:     I46.9 Cardiac Arrest  History:         Patient has prior history of Echocardiogram examinations. CAD                  and Previous Myocardial Infarction, Prior CABG; Risk                  Factors:Hypertension, Diabetes and Dyslipidemia. End Stage                  Renal Disease-Dialysis. Peripheral vascular disease.  Sonographer:     Carl Coma RDCS Referring Phys:  [id number] LONELL KANDICE MOOSE Diagnosing Phys: Dwayne D Callwood MD IMPRESSIONS  1. Left ventricular ejection fraction, by estimation, is 30 to 35%. The left ventricle has moderately decreased function. The left ventricle demonstrates regional wall motion abnormalities (see scoring diagram/findings for description). The left ventricular internal cavity size was mildly dilated. There is mild concentric left ventricular hypertrophy. Left ventricular  diastolic parameters are consistent with Grade I diastolic dysfunction (impaired relaxation).  2. Right ventricular systolic function is normal. The right ventricular size is mildly enlarged.  3. Left atrial size was moderately dilated.  4. Right atrial size was mildly dilated.  5. The mitral valve is grossly normal. Trivial mitral valve regurgitation.  6. The aortic valve is calcified. Aortic valve regurgitation is trivial. Aortic valve sclerosis/calcification is present, without any evidence of aortic stenosis. FINDINGS  Left Ventricle: Left ventricular ejection fraction, by estimation, is 30 to 35%. The left ventricle has moderately decreased function. The left ventricle demonstrates regional wall motion abnormalities. Strain was performed and the global longitudinal strain is indeterminate. The left ventricular internal cavity size was mildly dilated. There is mild concentric left ventricular hypertrophy. Left ventricular diastolic parameters are consistent with Grade I diastolic dysfunction (impaired relaxation). Right Ventricle: The right ventricular size is mildly enlarged. No increase in right ventricular wall thickness. Right ventricular systolic function is normal. Left Atrium: Left atrial size was moderately dilated. Right Atrium: Right atrial size was mildly dilated. Pericardium: There is no evidence of pericardial effusion. Mitral Valve: The mitral valve is grossly normal. Trivial mitral valve regurgitation. Tricuspid Valve: The tricuspid valve is normal in structure. Tricuspid valve regurgitation is mild. Aortic Valve: The aortic valve is calcified. Aortic valve regurgitation is trivial. Aortic valve sclerosis/calcification is present, without any evidence of aortic stenosis. Pulmonic Valve: The pulmonic valve was not well visualized. Pulmonic valve regurgitation is not visualized. Aorta: The aortic root was not well visualized. IAS/Shunts: No atrial level shunt detected by color flow Doppler.  Additional Comments: 3D was performed not requiring image post processing on an independent workstation and was indeterminate.  LEFT VENTRICLE PLAX 2D LVIDd:         5.10 cm   Diastology LVIDs:         4.40 cm   LV e' medial:    4.79 cm/s LV PW:  1.40 cm   LV E/e' medial:  16.4 LV IVS:        1.20 cm   LV e' lateral:   4.51 cm/s LVOT diam:     2.00 cm   LV E/e' lateral: 17.4 LV SV:         38 LV SV Index:   17 LVOT Area:     3.14 cm  RIGHT VENTRICLE            IVC RV Basal diam:  3.90 cm    IVC diam: 1.60 cm RV S prime:     9.31 cm/s TAPSE (M-mode): 1.6 cm LEFT ATRIUM             Index        RIGHT ATRIUM           Index LA diam:        4.70 cm 2.06 cm/m   RA Area:     16.60 cm LA Vol (A2C):   47.7 ml 20.89 ml/m  RA Volume:   44.30 ml  19.40 ml/m LA Vol (A4C):   34.6 ml 15.15 ml/m LA Biplane Vol: 41.3 ml 18.09 ml/m  AORTIC VALVE LVOT Vmax:   75.45 cm/s LVOT Vmean:  46.150 cm/s LVOT VTI:    0.122 m  AORTA Ao Root diam: 3.70 cm Ao Asc diam:  3.60 cm MITRAL VALVE MV Area (PHT): 4.13 cm    SHUNTS MV Decel Time: 184 msec    Systemic VTI:  0.12 m MV E velocity: 78.60 cm/s  Systemic Diam: 2.00 cm MV A velocity: 93.55 cm/s MV E/A ratio:  0.84 Dwayne D Callwood MD Electronically signed by Cara JONETTA Lovelace MD Signature Date/Time: 04/26/2024/2:59:25 PM    Final    US  Venous Img Lower Bilateral (DVT) Result Date: 04/26/2024 EXAM: ULTRASOUND DUPLEX OF THE BILATERAL LOWER EXTREMITY VEINS TECHNIQUE: Duplex ultrasound using B-mode/gray scaled imaging and Doppler spectral analysis and color flow was obtained of the deep venous structures of the bilateral lower extremity. COMPARISON: None available. CLINICAL HISTORY: 201733 DVT (deep venous thrombosis) (HCC) 798266 DVT (deep venous thrombosis) (HCC) FINDINGS: The common femoral vein, femoral vein, popliteal vein, and posterior tibial vein demonstrate normal compressibility with normal color flow and spectral analysis bilaterally . IMPRESSION: 1. No evidence of DVT  bilaterally . Electronically signed by: Oneil Devonshire MD MD 04/26/2024 02:32 AM EST RP Workstation: [id number]   DG Chest Port 1 View Result Date: 04/25/2024 CLINICAL DATA:  Intubated EXAM: PORTABLE CHEST 1 VIEW COMPARISON:  04/25/2024 FINDINGS: Single frontal view of the chest demonstrates endotracheal tube overlying tracheal air column, tip approximately 10 cm above the carina. Enteric catheter passes below diaphragm, tip excluded by collimation. Right internal jugular dialysis catheter unchanged. Stable cardiac silhouette. No airspace disease, effusion, or pneumothorax. No acute bony abnormalities. IMPRESSION: 1. Retraction of endotracheal tube since prior study, with tip now approximately 10 cm above carina. 2. No acute airspace disease. Electronically Signed   By: Ozell Daring M.D.   On: 04/25/2024 23:23   DG Chest Port 1 View Result Date: 04/25/2024 EXAM: 1 VIEW(S) XRAY OF THE CHEST 04/25/2024 07:06:16 PM COMPARISON: 04/25/2024 CLINICAL HISTORY: Endotracheal tube present FINDINGS: LINES, TUBES AND DEVICES: Endotracheal tube in place with tip at midthoracic trachea, 5.9 cm above the carina. Gastric tube in place, courses below diaphragm with tip outside field of view. Right IJ CVC in place with tip at [facility]. Defibrillator pads noted overlying chest. LUNGS AND PLEURA: Low lung volumes. No focal pulmonary  opacity. No pleural effusion. No pneumothorax. HEART AND MEDIASTINUM: No acute abnormality of the cardiac and mediastinal silhouettes. BONES AND SOFT TISSUES: Prior sternotomy and CABG noted. No acute osseous abnormality. IMPRESSION: 1. Endotracheal tube, gastric tube, and right IJ CVC in appropriate position. 2. No acute cardiopulmonary abnormality. Pulmonary hypoinflation 3. Prior sternotomy and CABG. Electronically signed by: Dorethia Molt MD MD 04/25/2024 08:13 PM EST RP Workstation: [id number]   CT CHEST ABDOMEN PELVIS W CONTRAST Result Date: 04/25/2024 EXAM: CT CHEST, ABDOMEN AND PELVIS WITH  CONTRAST 04/25/2024 05:35:00 PM TECHNIQUE: CT of the chest, abdomen and pelvis was performed with the administration of 100 mL of iohexol  (OMNIPAQUE ) 300 MG/ML solution. Multiplanar reformatted images are provided for review. Automated exposure control, iterative reconstruction, and/or weight based adjustment of the mA/kV was utilized to reduce the radiation dose to as low as reasonably achievable. COMPARISON: 08/28/2023 CLINICAL HISTORY: Resuscitated arrest. FINDINGS: CHEST: MEDIASTINUM AND LYMPH NODES: Low lying endotracheal tube positioned at the ostium of the right mainstem bronchus. 3 cm off retraction recommended. Borderline cardiomegaly. Dense multivessel coronary atherosclerosis with changes of a prior CABG procedure. Sternotomy wires. Tunneled right IJ approach hemodialysis catheter in place terminating in the high right atrium. The central airways are clear. No mediastinal, hilar or axillary lymphadenopathy. LUNGS AND PLEURA: Patchy dependent airspace opacities in both lungs with a small amount of central ground glass airspace opacity in the left upper lobe. Areas of dependent nodularity noted in the left lower lobe. Trace right pleural effusion. No pneumothorax. ABDOMEN AND PELVIS: LIVER: Cholecystectomy. GALLBLADDER AND BILE DUCTS: Unremarkable. No biliary ductal dilatation. SPLEEN: No acute abnormality. PANCREAS: No acute abnormality. ADRENAL GLANDS: No acute abnormality. KIDNEYS, URETERS AND BLADDER: Urinary bladder is completely decompressed with a urinary catheter in place. No stones in the kidneys or ureters. No hydronephrosis. No perinephric or periureteral stranding. GI AND BOWEL: Esophagogastric tube is well positioned terminating in the stomach. Scattered sigmoid diverticulosis. Normal appendix. There is no bowel obstruction. REPRODUCTIVE ORGANS: No acute abnormality. PERITONEUM AND RETROPERITONEUM: Well positioned peritoneal dialysis catheter terminating in the pelvis. Small volume free fluid  in the abdomen, likely related to the peritoneal dialysate. No free air. VASCULATURE: Diffuse aortoiliac atherosclerosis. Aorta is normal in caliber. ABDOMINAL AND PELVIS LYMPH NODES: No lymphadenopathy. BONES AND SOFT TISSUES: Mildly displaced fractures of the right anterior 2nd and 5th ribs. Nondisplaced buckle type fractures of the anterior right 3rd, 4th, and 6th ribs. Nondisplaced buckle type fracture of the anterior left 3rd rib with a mildly displaced anterior left 4th rib fracture. Anterior buckle fractures of the anterior left 5th and 6th ribs. Moderate volume symmetric bilateral gynecomastia. Small fat containing bilateral inguinal hernias. IMPRESSION: 1. Low lying endotracheal tube positioned at the ostium of the right mainstem bronchus. 3 cm of retraction is recommended. 2. Patchy dependent airspace opacities in both lungs with a small amount of central ground glass airspace opacity in the left upper lobe and areas of dependent nodularity in the left lower lobe. This may reflect changes of aspiration or a developing bronchopneumonia. 3. Multiple acute rib fractures bilaterally, as described above. Trace right pleural effusion. No pneumothorax. 4. Well-positioned esophagogastric and right IJ approach hemodialysis catheters. 5. Small volume ascites in the abdomen, likely related to peritoneal dialysate. 6. Scattered sigmoid diverticulosis. Electronically signed by: Rogelia Myers MD MD 04/25/2024 06:51 PM EST RP Workstation: [id number]   CT Head Wo Contrast Result Date: 04/25/2024 CLINICAL DATA:  Witnessed cardiac arrest status post resuscitation EXAM: CT HEAD WITHOUT CONTRAST TECHNIQUE: Contiguous axial images  were obtained from the base of the skull through the vertex without intravenous contrast. RADIATION DOSE REDUCTION: This exam was performed according to the departmental dose-optimization program which includes automated exposure control, adjustment of the mA and/or kV according to patient size  and/or use of iterative reconstruction technique. COMPARISON:  07/26/2023 FINDINGS: Brain: No acute infarct or hemorrhage. Stable chronic small vessel ischemic changes within the left thalamus. Lateral ventricles and midline structures are unremarkable. No acute extra-axial fluid collections. No mass effect. Vascular: No hyperdense vessel or unexpected calcification. Skull: Normal. Negative for fracture or focal lesion. Sinuses/Orbits: Opacification of the right frontal, bilateral ethmoid, and sphenoid sinus, which has progressed since prior study. Other: None. IMPRESSION: 1. No acute intracranial process. 2. Progressive chronic sinus disease. Electronically Signed   By: Ozell Daring M.D.   On: 04/25/2024 18:34   DG Abdomen 1 View Result Date: 04/25/2024 CLINICAL DATA:  Enteric catheter placement EXAM: ABDOMEN - 1 VIEW COMPARISON:  04/29/2023 FINDINGS: Frontal view of the lower chest and upper abdomen demonstrates enteric catheter passing below diaphragm, tip and side port projecting over the gastric fundus. Bowel gas pattern is unremarkable. IMPRESSION: 1. Enteric catheter tip projecting over the gastric fundus. Electronically Signed   By: Ozell Daring M.D.   On: 04/25/2024 15:17   DG Chest 1 View Result Date: 04/25/2024 CLINICAL DATA:  Intubated EXAM: CHEST  1 VIEW COMPARISON:  08/27/2023 FINDINGS: Single frontal view of the chest demonstrates endotracheal tube overlying tracheal air column, tip approximately 4 cm above carina. Enteric catheter passes below diaphragm, tip excluded by collimation. Right internal jugular dialysis catheter tip overlies superior vena cava. 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 Cardiac silhouette is unremarkable. No acute airspace disease, effusion, or pneumothorax. IMPRESSION: 1. Support devices as above. 2. No acute airspace disease. Electronically Signed   By: Ozell Daring M.D.   On: 04/25/2024 15:16   PERIPHERAL VASCULAR CATHETERIZATION Result Date:  04/21/2024 See surgical note for result.   Assessment: 59 y.o. male PMH of ESRD on dialysis, CAD status post CABG in 2010, CHF, hypertension, hyperlipidemia, diabetes, PAD status post left BKA, bilateral legally blind admitted postcardiac arrest on 1/12. Intubated and admitted to ICU.  Cardiology consulted, on heparin  IV for non-STEMI. Patient was found to have myoclonic jerking movement, and EEG showed generalized myoclonic seizures.  Currently no clinical seizure, repeat EEG no seizure but generalized spikes.  MRI showed no significant anoxic brain injury.  Given the generalized spikes on EEG, will need start Keppra .  Plan: - Keppra  2 g load followed by 1000mg  daily per ESRD dosing, discussed with pharmacy - Seizure precautions - Continue heparin  IV - Continue monitoring for prognostication - Will follow  Thank you for this consultation and allowing us  to participate in the care of this patient.  Ary Cummins, MD PhD Stroke Neurology 04/27/2024 6:35 PM  This patient is critically ill due to cardiac arrest, myoclonic seizure, ACS and at significant risk of neurological worsening, death form heart failure, cardiac arrest, anoxic brain injury, status epilepticus. This patient's care requires constant monitoring of vital signs, hemodynamics, respiratory and cardiac monitoring, review of multiple databases, neurological assessment, discussion with family, other specialists and medical decision making of high complexity. I spent 35 minutes of neurocritical care time in the care of this patient. I had long discussion with wife and daughters at bedside, updated pt current condition, treatment plan and potential prognosis, and answered all the questions.  They expressed understanding and appreciation.       [  1]  Allergies Allergen Reactions   Baclofen Other (See Comments)    Disorientation, Contraindication with dialysis    Nsaids Other (See Comments)    CKD on dialysis   [2]  No current  facility-administered medications on file prior to encounter.   Current Outpatient Medications on File Prior to Encounter  Medication Sig Dispense Refill   acetaminophen  (TYLENOL ) 325 MG tablet Take 1 tablet (325 mg total) by mouth every 6 (six) hours as needed for mild pain (pain score 1-3 or temp > 100.5). 30 tablet 0   amLODipine  (NORVASC ) 10 MG tablet Take 0.5 tablets (5 mg total) by mouth daily. 30 tablet 2   ammonium lactate  (AMLACTIN) 12 % cream Apply topically to feet, toes and thickened areas of skin at arms and legs bid 385 g 2   aspirin  (ASPIRIN  CHILDRENS) 81 MG chewable tablet Chew 1 tablet (81 mg total) by mouth daily. 30 tablet 1   brimonidine -timolol  (COMBIGAN ) 0.2-0.5 % ophthalmic solution Place 1 drop into both eyes in the morning and at bedtime.     Cholecalciferol  (VITAMIN D3) 1000 units CAPS Take 1,000 Units by mouth in the morning.     clobetasol  cream (TEMOVATE ) 0.05 % Apply topically to affected area of thickness at arms and legs qd/bid prn Avoid applying to face, groin, and axilla. Use as directed. Use up to 4 weeks 60 g 1   dorzolamide  (TRUSOPT ) 2 % ophthalmic solution Place 1 drop into both eyes 2 (two) times daily.     ezetimibe  (ZETIA ) 10 MG tablet TAKE 1 TABLET BY MOUTH EVERYDAY AT BEDTIME 90 tablet 1   FLOMAX  0.4 MG CAPS capsule Take 0.4 mg by mouth at bedtime.     fluticasone  (FLONASE ) 50 MCG/ACT nasal spray SPRAY 2 SPRAYS INTO EACH NOSTRIL EVERY DAY 48 mL 2   gabapentin  (NEURONTIN ) 300 MG capsule Take 1 capsule (300 mg total) by mouth 2 (two) times daily. 60 capsule 1   gentamicin  cream (GARAMYCIN ) 0.1 % Apply 1 application  topically See admin instructions. Apply around port site as directed     lactulose  (CHRONULAC ) 10 GM/15ML solution TAKE 30 MLS (20 G TOTAL) BY MOUTH AS NEEDED. 946 mL 0   latanoprost  (XALATAN ) 0.005 % ophthalmic solution Place 1 drop into both eyes in the morning.     loratadine  (CLARITIN ) 10 MG tablet TAKE 1 TABLET BY MOUTH EVERY DAY 90 tablet  0   metoCLOPramide  (REGLAN ) 10 MG tablet TAKE 1 TABLET (10 MG TOTAL) BY MOUTH AT BEDTIME. PLEASE KEEP YOUR DECEMBER APPOINTMENT FOR FURTHER REFILLS. 30 tablet 1   midodrine  (PROAMATINE ) 5 MG tablet Take 5 mg by mouth 2 (two) times daily as needed (if the Systolic number is less than 100).     mometasone  (ELOCON ) 0.1 % cream Apply 1 Application topically as directed. Qd to bid to aa right ankle, right lower leg until improved, then prn flares (Patient taking differently: Apply 1 Application topically See admin instructions. Apply to affected area of the ankle and lower leg 1-2 times a day until improvement is seen, then as needed for flares) 45 g 2   Multiple Vitamin (MULTIVITAMIN ADULT PO) Take 1 tablet by mouth daily.     ondansetron  (ZOFRAN -ODT) 4 MG disintegrating tablet Take 4 mg by mouth every 8 (eight) hours as needed for nausea or vomiting (dissolve orally).     pantoprazole  (PROTONIX ) 40 MG tablet TAKE 1 TABLET BY MOUTH TWICE A DAY 180 tablet 0   PARoxetine (PAXIL) 10 MG tablet  Take 10 mg by mouth every morning.     Potassium Chloride  CR (MICRO-K ) 8 MEQ CPCR capsule CR Take 1 capsule by mouth 2 (two) times daily.     torsemide  (DEMADEX ) 20 MG tablet Take 40 mg by mouth 2 (two) times daily. At breakfast and lunchtime     Vitamin D , Ergocalciferol , (DRISDOL) 1.25 MG (50000 UNIT) CAPS capsule Take 50,000 Units by mouth every 14 (fourteen) days.     atorvastatin  (LIPITOR ) 80 MG tablet Take 1 tablet (80 mg total) by mouth at bedtime. (Patient not taking: Reported on 04/21/2024) 30 tablet 1   azelastine  (ASTELIN ) 0.1 % nasal spray Place 2 sprays into both nostrils 2 (two) times daily. Use in each nostril as directed (Patient not taking: Reported on 04/25/2024) 30 mL 12   Continuous Glucose Sensor (DEXCOM G7 SENSOR) MISC Change sensor every 10 days 9 each 3   Efinaconazole  (JUBLIA ) 10 % SOLN Apply topically to affected toenails at night for onychomycosis (Patient not taking: Reported on 04/25/2024) 8 mL 6    guaiFENesin  (MUCINEX ) 600 MG 12 hr tablet Take 1 tablet (600 mg total) by mouth 2 (two) times daily as needed for to loosen phlegm. (Patient not taking: Reported on 04/21/2024) 60 tablet 0   insulin  degludec (TRESIBA  FLEXTOUCH) 200 UNIT/ML FlexTouch Pen INJECT 42 UNITS INTO THE SKIN IN THE MORNING AND AT BEDTIME. 27 mL 1   insulin  lispro (HUMALOG ) 100 UNIT/ML KwikPen MAX DAILY 45 UNITS 45 mL 3   Insulin  NPH, Human,, Isophane, (HUMULIN  N KWIKPEN) 100 UNIT/ML Kiwkpen INJECT 70 UNITS INTO THE SKIN DAILY IN THE AFTERNOON. TO BE TAKEN AT THE START OF DIALYSIS 180 mL 0   KLOR-CON  M20 20 MEQ tablet Take 20 mEq by mouth in the morning. (Patient not taking: Reported on 04/25/2024)     senna (SENOKOT) 8.6 MG tablet Take 2 tablets by mouth in the morning. (Patient not taking: Reported on 04/25/2024)     sevelamer  carbonate (RENVELA ) 800 MG tablet Take 1 tablet (800 mg total) by mouth 3 (three) times daily with meals. (Patient not taking: Reported on 04/21/2024) 120 tablet 0   terbinafine  (LAMISIL ) 250 MG tablet Take 1 tablet daily (Patient not taking: Reported on 04/21/2024) 30 tablet 0   Testosterone  20.25 MG/ACT (1.62%) GEL Place 2 Pump onto the skin daily in the afternoon. (Patient not taking: Reported on 04/25/2024) 75 g 5

## 2024-04-27 NOTE — Progress Notes (Signed)
 "  NAME:  Chase Clark, MRN:  [id number], DOB:  Sep 08, 1965, LOS: 2 ADMISSION DATE:  04/25/2024, CONSULTATION DATE: 04/25/2024 REFERRING MD: Dr. Fernand,   CHIEF COMPLAINT: Cardiac Arrest   History of Present Illness:  This is a 59 yo male who presented to [facility] ER on 01/12 from hemodialysis following a cardiac arrest.  Per ER notes pt cardiac arrested 45 minutes following initiation of first outpatient  hemodialysis session.  Per ED notes pt was in asystole and EMS administered a dose of bicarb and calcium  gluconate prior to ROSC.  Igel inserted by EMS.   ED Course  Upon arrival to the ER pt appeared to be spontaneously breathing with Igel in place.  Igel exchanged for ETT by EDP.  Significant lab results were Na+ 129/chloride 91/CO2 16/glucose 175/BUN 49/creatinine 8.70/anion gap 22/AST 290/ALT 163/troponin 4,922/lactic acid 8.1/wbc 18.9/hgb 10.6.  CXR and COVID/RSV/Influenza A&B negative.  CT Chest/Abd/Pelvis and CT Head results pending.  Pt requiring levophed  gtt to maintain map >65.  Pt admitted to ICU per PCCM team for additional workup and treatment.    Pertinent  Medical History   Past Medical History:  Diagnosis Date   Anemia    Arthritis    Asthma    as child   Atypical mole 09/22/2022   R spinal mid back, needs excision   Barrett's esophagus 02/16/2014   short segment   Cataract    Coronary artery disease    DDD (degenerative disc disease), cervical    Diabetes mellitus without complication (HCC)    Diabetic osteomyelitis (HCC)    Diabetic peripheral neuropathy (HCC)    Diabetic ulcer of foot with muscle involvement without evidence of necrosis (HCC)    DIABETIC ULCERATIONS ASSOCIATED WITH IRRITATION LATERAL ANKLE LEFT GREATER THAN RIGHT WITH MILD CELLULITIS    Diverticulosis    DKA (diabetic ketoacidoses) 08/23/2017   Edema, lower extremity    ESRD on hemodialysis (HCC)    Fatty liver    Gastroparesis 2015   GERD (gastroesophageal reflux disease)    Gilbert's disease     Glaucoma    Hx of BKA, left (HCC)    Hx of CABG 01/27/2019   2 vessels; LIMA-LAD, SVG-D1   Hx of heart artery stent 11/20/2009   80% D1 stenosis - 2.5 x 18 mm Xience V Everolimus (DES) stent x 1 placed   Hyperlipidemia    Hypertension    Left below-knee amputee (HCC)    Legally blind    Myocardial infarction (HCC) 11/2009   Neuropathy    Osteoarthritis of spine    Osteomyelitis of left foot (HCC)    Peritoneal dialysis catheter in place    Does dialysis over night.   PONV (postoperative nausea and vomiting)    PVD (peripheral vascular disease)    Seasonal allergies    Shoulder pain    Tubular adenoma of colon    Ulcer of foot (HCC) 11/17/2012   DIABETIC ULCERATIONS ASSOCIATED WITH IRRITATION LATERAL ANKLE LEFT GREATER THAN RIGHT WITH MILD CELLULITIS   Micro Data:    COVID/RSV/Flu A&B 01/12>>negative  Blood x2 01/12>> MRSA PCR 01/12>> Tracheal aspirate 01/12>> Blood x2 01/12>>   Anti-infectives (From admission, onward)    Start     Dose/Rate Route Frequency Ordered Stop   04/25/24 1900  cefTRIAXone  (ROCEPHIN ) 2 g in sodium chloride  0.9 % 100 mL IVPB        2 g 200 mL/hr over 30 Minutes Intravenous Every 24 hours 04/25/24 1847  Significant Hospital Events: Including procedures, antibiotic start and stop dates in addition to other pertinent events   01/12: Admitted to ICU post cardiac arrest during initial outpatient hemodialysis session requiring mechanical intubation  1/13 remains on  vent, +myoclonus, started KEPPRA  1/14 remains critically ill, versed  infusion started  Interim History / Subjective:  Remains critically ill Remains intubated Requires VENT support for survival EEG  pending SIGNS OF BRAIN DAMAGE  Vent Mode: PRVC FiO2 (%):  [35 %] 35 % Set Rate:  [20 bmp] 20 bmp Vt Set:  [500 mL] 500 mL PEEP:  [5 cmH20] 5 cmH20   Objective    Blood pressure (!) 118/59, pulse 72, temperature 99.5 F (37.5 C), temperature source Esophageal, resp. rate  12, height 5' 10 (1.778 m), weight 110.1 kg, SpO2 96%.    Vent Mode: PRVC FiO2 (%):  [35 %] 35 % Set Rate:  [20 bmp] 20 bmp Vt Set:  [500 mL] 500 mL PEEP:  [5 cmH20] 5 cmH20   Intake/Output Summary (Last 24 hours) at 04/27/2024 0747 Last data filed at 04/27/2024 0700 Gross per 24 hour  Intake 1477.97 ml  Output 250 ml  Net 1227.97 ml   Filed Weights   04/25/24 2230 04/26/24 0415 04/27/24 0500  Weight: 108.4 kg 108.4 kg 110.1 kg     REVIEW OF SYSTEMS  PATIENT IS UNABLE TO PROVIDE COMPLETE REVIEW OF SYSTEMS DUE TO SEVERE CRITICAL ILLNESS   PHYSICAL EXAMINATION:  GENERAL:critically ill appearing MOUTH: Moist mucosal membrane. INTUBATED PULMONARY: CTA b/l CARDIOVASCULAR:s1s2 GASTROINTESTINAL: Positive bowel sounds.  MUSCULOSKELETAL: no edema.  NEUROLOGIC: obtunded,sedated   Assessment and Plan   59 yo morbidly obese white male with ESRD on HD with acute cardiac arrest from NSTEMI with underlying CABG stressed from first session of HD, leading to prolonged downtime and physical exam and findings concerning for brain damage  Severe ACUTE Hypoxic and Hypercapnic Respiratory Failure -continue Mechanical Ventilator support -Wean Fio2 and PEEP as tolerated -VAP/VENT bundle implementation - Wean PEEP & FiO2 as tolerated, maintain SpO2 > 88% - Head of bed elevated 30 degrees, VAP protocol in place - Plateau pressures less than 30 cm H20  - Intermittent chest x-ray & ABG PRN - Ensure adequate pulmonary hygiene   NEUROLOGY ACUTE METABOLIC ENCEPHALOPATHY-concerns for anoxic brain damage MRI BRAIN PENDING CT Head 01/12: No acute intracranial process. Progressive chronic sinus disease. - Correct metabolic derangements  - Avoid sedating medications as able  - Maintain RASS goal 0 to -1 - PAD protocol to maintain RASS goal: propofol  gtt  - WUA daily  - Promote family presence at bedside   EEG Results pending Continue Keppra   Cardiac arrest (asystole) suspect secondary to  hemodialysis  Cardiogenic shock  Elevated troponin suspect NSTEMI  Severe lactic acidosis  Pressors as needed AC with heparin  ECHO Hx: CAD, HLD, heart murmur, MI, and PVD  - Continuous telemetry monitoring  - Trend troponin's until peaked  - Trend lactic acid until normalized  - Prn levophed  gtt to maintain map >65 - Heparin  gtt  - Echo EF 30%    RENAL/ESRD ON HD -continue Foley Catheter-assess need -Avoid nephrotoxic agents -Follow urine output, BMP -Ensure adequate renal perfusion, optimize oxygenation -Renal dose medications  High risk for cardiac arrest with HD plan for CRRT in next 24 hrs as per nephrology, plan to start CRRT after MRI brain  Intake/Output Summary (Last 24 hours) at 04/27/2024 0752 Last data filed at 04/27/2024 0700 Gross per 24 hour  Intake 1477.97 ml  Output 250  ml  Net 1227.97 ml    Transaminitis  - Trend hepatic function panel  - Avoid hepatotoxic agents as able   ACUTE ANEMIA- TRANSFUSE AS NEEDED CONSIDER TRANSFUSION  IF HGB<7 DVT PRX with TED/SCD's ONLY  ELECTROLYTES -follow labs as needed -replace as needed -pharmacy consultation and following    01/12: Pts family updated extensively by Dr. Isaiah regarding pts condition and current plan of care.  All questions answered  Labs   CBC: Recent Labs  Lab 04/25/24 1342 04/25/24 2304 04/26/24 0636 04/27/24 0507  WBC 18.9* 15.1* 12.6* 12.9*  HGB 10.6* 9.5* 9.6* 8.3*  HCT 33.7* 28.0* 29.2* 25.7*  MCV 112.3* 107.7* 107.4* 106.6*  PLT 276 259 245 225    Basic Metabolic Panel: Recent Labs  Lab 04/25/24 1342 04/25/24 2055 04/26/24 0636 04/27/24 0507  NA 129*  --  126* 128*  K 4.6  --  4.1 4.2  CL 91*  --  87* 90*  CO2 16*  --  23 22  GLUCOSE 175*  --  191* 198*  BUN 49*  --  57* 73*  CREATININE 8.70*  --  9.51* 11.50*  CALCIUM  9.1  --  8.8* 8.6*  MG  --  1.7 1.8 2.4  PHOS  --  6.4* 6.7* 6.9*   GFR: Estimated Creatinine Clearance: 8.5 mL/min (A) (by C-G formula based on  SCr of 11.5 mg/dL (H)). Recent Labs  Lab 04/25/24 1342 04/25/24 2055 04/25/24 2304 04/25/24 2328 04/26/24 0338 04/26/24 0636 04/27/24 0507  WBC 18.9*  --  15.1*  --   --  12.6* 12.9*  LATICACIDVEN 8.1* 5.2*  --  2.3* 1.8  --   --     Liver Function Tests: Recent Labs  Lab 04/25/24 1342 04/26/24 0636  AST 290* 113*  ALT 163* 109*  ALKPHOS 245* 223*  BILITOT 0.5 0.3  PROT 6.8 6.5  ALBUMIN  2.8* 2.8*   No results for input(s): LIPASE, AMYLASE in the last 168 hours. No results for input(s): AMMONIA in the last 168 hours.  ABG    Component Value Date/Time   PHART 7.32 (L) 04/25/2024 1430   PCO2ART 45 04/25/2024 1430   PO2ART 140 (H) 04/25/2024 1430   HCO3 23.2 04/25/2024 1430   TCO2 27 07/26/2023 1842   ACIDBASEDEF 3.1 (H) 04/25/2024 1430   O2SAT 99.7 04/25/2024 1430     Coagulation Profile: Recent Labs  Lab 04/25/24 2055  INR 1.6*    Cardiac Enzymes: No results for input(s): CKTOTAL, CKMB, CKMBINDEX, TROPONINI in the last 168 hours.  HbA1C: Hgb A1c MFr Bld  Date/Time Value Ref Range Status  04/25/2024 06:09 PM 6.2 (H) 4.8 - 5.6 % Final    Comment:    (NOTE) Diagnosis of Diabetes The following HbA1c ranges recommended by the American Diabetes Association (ADA) may be used as an aid in the diagnosis of diabetes mellitus.  Hemoglobin             Suggested A1C NGSP%              Diagnosis  <5.7                   Non Diabetic  5.7-6.4                Pre-Diabetic  >6.4                   Diabetic  <7.0  Glycemic control for                       adults with diabetes.    07/26/2023 08:25 PM 4.9 4.8 - 5.6 % Final    Comment:    (NOTE) Pre diabetes:          5.7%-6.4%  Diabetes:              >6.4%  Glycemic control for   <7.0% adults with diabetes     CBG: Recent Labs  Lab 04/26/24 1113 04/26/24 1631 04/26/24 2011 04/26/24 2333 04/27/24 0329  GLUCAP 169* 177* 170* 176* 190*    Review of Systems:    Unable to assess pt mechanically intubated   Past Medical History:  He,  has a past medical history of Anemia, Arthritis, Asthma, Atypical mole (09/22/2022), Barrett's esophagus (02/16/2014), Cataract, Coronary artery disease, DDD (degenerative disc disease), cervical, Diabetes mellitus without complication (HCC), Diabetic osteomyelitis (HCC), Diabetic peripheral neuropathy (HCC), Diabetic ulcer of foot with muscle involvement without evidence of necrosis (HCC), Diverticulosis, DKA (diabetic ketoacidoses) (08/23/2017), Edema, lower extremity, ESRD on hemodialysis (HCC), Fatty liver, Gastroparesis (2015), GERD (gastroesophageal reflux disease), Gilbert's disease, Glaucoma, BKA, left (HCC), CABG (01/27/2019), heart artery stent (11/20/2009), Hyperlipidemia, Hypertension, Left below-knee amputee (HCC), Legally blind, Myocardial infarction (HCC) (11/2009), Neuropathy, Osteoarthritis of spine, Osteomyelitis of left foot (HCC), Peritoneal dialysis catheter in place, PONV (postoperative nausea and vomiting), PVD (peripheral vascular disease), Seasonal allergies, Shoulder pain, Tubular adenoma of colon, and Ulcer of foot (HCC) (11/17/2012).   Surgical History:   Past Surgical History:  Procedure Laterality Date   ABDOMINAL AORTOGRAM N/A 05/20/2016   Procedure: Abdominal Aortogram possible intervention;  Surgeon: Cordella KANDICE Shawl, MD;  Location: [facility] INVASIVE CV LAB;  Service: Cardiovascular;  Laterality: N/A;   AMPUTATION Left 09/05/2017   Procedure: AMPUTATION BELOW KNEE;  Surgeon: Dozier Soulier, MD;  Location: [facility] OR;  Service: Orthopedics;  Laterality: Left;   ANTERIOR CERVICAL DECOMP/DISCECTOMY FUSION N/A 05/07/2017   Procedure: ANTERIOR CERVICAL DECOMPRESSION/DISCECTOMY CORTEZ REDHEAD PROSTHESIS,PLATE/SCREWS CERVICAL FIVE - CERVICAL SIX;  Surgeon: Mavis Purchase, MD;  Location: [facility] OR;  Service: Neurosurgery;  Laterality: N/A;   CAPD INSERTION N/A 08/30/2020   Procedure: LAPAROSCOPIC INSERTION  CONTINUOUS AMBULATORY PERITONEAL DIALYSIS  (CAPD) CATHETER;  Surgeon: Desiderio Schanz, MD;  Location: [facility] ORS;  Service: General;  Laterality: N/A;   CATARACT EXTRACTION W/ INTRAOCULAR LENS IMPLANT     CATARACT EXTRACTION W/PHACO Left 02/26/2017   Procedure: CATARACT EXTRACTION PHACO AND INTRAOCULAR LENS PLACEMENT (IOC);  Surgeon: Myrna Adine Anes, MD;  Location: [facility] ORS;  Service: Ophthalmology;  Laterality: Left;  Lot # [id number] H US : 00:25.3 AP%: 6.2 CDE: 1.59    CHOLECYSTECTOMY N/A    COLONOSCOPY WITH PROPOFOL  N/A 05/30/2021   Procedure: COLONOSCOPY WITH PROPOFOL ;  Surgeon: Albertus Gordy HERO, MD;  Location: [facility]OS[facility];  Service: Gastroenterology;  Laterality: N/A;   CORONARY ANGIOPLASTY WITH STENT PLACEMENT  11/20/2009   80% D1 stenosis - 2.5 x 18 mm Xience V Everolimus (DES) stent x 1 placed; LOCATION: [facility]; SURGEON: Vinie Jude, MD   CORONARY ARTERY BYPASS GRAFT N/A 01/27/2019   Procedure: OFF PUMP CORONARY ARTERY BYPASS GRAFTING (CABG) X 2 WITH ENDOSCOPIC HARVESTING OF RIGHT GREATER SAPHENOUS VEIN. LIMA TO LAD;  Surgeon: Shyrl Linnie KIDD, MD;  Location: [facility] OR;  Service: Open Heart Surgery;  Laterality: N/A;   CORONARY STENT INTERVENTION N/A 01/21/2019   Procedure: CORONARY STENT INTERVENTION;  Surgeon: Claudene Victory ORN, MD;  Location: [facility] INVASIVE CV  LAB;  Service: Cardiovascular;  Laterality: N/A;   DIALYSIS/PERMA CATHETER INSERTION N/A 04/21/2024   Procedure: DIALYSIS/PERMA CATHETER INSERTION;  Surgeon: Marea Selinda RAMAN, MD;  Location: [facility] INVASIVE CV LAB;  Service: Cardiovascular;  Laterality: N/A;   EPIBLEPHERON REPAIR WITH TEAR DUCT PROBING     EYE SURGERY Right 02/09/2014   cataract extraction   INSERTION EXPRESS TUBE SHUNT Right 09/06/2015   Procedure: INSERTION AHMED TUBE SHUNT with tutoplast allograft;  Surgeon: Adine Oneil Novak, MD;  Location: [facility] ORS;  Service: Ophthalmology;  Laterality: Right;   laparoscopic insertion continuous peritoneal dialysis  2022   LEFT HEART CATH AND  CORONARY ANGIOGRAPHY N/A 01/20/2019   Procedure: LEFT HEART CATH AND CORONARY ANGIOGRAPHY;  Surgeon: Court Dorn PARAS, MD;  Location: [facility] INVASIVE CV LAB;  Service: Cardiovascular;  Laterality: N/A;   POLYPECTOMY  05/30/2021   Procedure: POLYPECTOMY;  Surgeon: Albertus Gordy HERO, MD;  Location: [facility];  Service: Gastroenterology;;   TEE WITHOUT CARDIOVERSION N/A 01/27/2019   Procedure: TRANSESOPHAGEAL ECHOCARDIOGRAM (TEE);  Surgeon: Shyrl Linnie KIDD, MD;  Location: [facility] OR;  Service: Open Heart Surgery;  Laterality: N/A;   VASECTOMY       Social History:   reports that he quit smoking about 16 years ago. His smoking use included cigarettes. He started smoking about 36 years ago. He has a 10 pack-year smoking history. He has never used smokeless tobacco. He reports that he does not currently use alcohol . He reports that he does not use drugs.   Family History:  His family history includes Diabetes in his father and mother; Heart attack in his father; Hemochromatosis in an other family member; Hyperlipidemia in his father and mother; Hypertension in his father; Liver disease in his mother; Lymphoma in his father; Other in his mother; Polycythemia in an other family member. There is no history of Colon cancer, Esophageal cancer, Rectal cancer, or Stomach cancer.   Allergies Allergies[1]   Home Medications  Prior to Admission medications  Medication Sig Start Date End Date Taking? Authorizing Provider  acetaminophen  (TYLENOL ) 325 MG tablet Take 1 tablet (325 mg total) by mouth every 6 (six) hours as needed for mild pain (pain score 1-3 or temp > 100.5). 09/06/17  Yes Laliberte, Danielle, PA-C  amLODipine  (NORVASC ) 10 MG tablet Take 0.5 tablets (5 mg total) by mouth daily. 08/30/23  Yes Regalado, Belkys A, MD  ammonium lactate  (AMLACTIN) 12 % cream Apply topically to feet, toes and thickened areas of skin at arms and legs bid 11/19/23  Yes Jackquline Sawyer, MD  aspirin  (ASPIRIN  CHILDRENS) 81 MG  chewable tablet Chew 1 tablet (81 mg total) by mouth daily. 03/04/19  Yes Lightfoot, Linnie KIDD, MD  brimonidine -timolol  (COMBIGAN ) 0.2-0.5 % ophthalmic solution Place 1 drop into both eyes in the morning and at bedtime.   Yes [provider]  Cholecalciferol  (VITAMIN D3) 1000 units CAPS Take 1,000 Units by mouth in the morning.   Yes [provider]  clobetasol  cream (TEMOVATE ) 0.05 % Apply topically to affected area of thickness at arms and legs qd/bid prn Avoid applying to face, groin, and axilla. Use as directed. Use up to 4 weeks 11/19/23  Yes Jackquline Sawyer, MD  dorzolamide  (TRUSOPT ) 2 % ophthalmic solution Place 1 drop into both eyes 2 (two) times daily. 10/18/19  Yes [provider]  ezetimibe  (ZETIA ) 10 MG tablet TAKE 1 TABLET BY MOUTH EVERYDAY AT BEDTIME 11/02/23  Yes Vincente Saber, NP  FLOMAX  0.4 MG CAPS capsule Take 0.4 mg by mouth at  bedtime. 04/03/21  Yes [provider]  fluticasone  (FLONASE ) 50 MCG/ACT nasal spray SPRAY 2 SPRAYS INTO EACH NOSTRIL EVERY DAY 11/14/19  Yes Maribeth Camellia MATSU, MD  gabapentin  (NEURONTIN ) 300 MG capsule Take 1 capsule (300 mg total) by mouth 2 (two) times daily. 07/30/23  Yes Pokhrel, Laxman, MD  gentamicin  cream (GARAMYCIN ) 0.1 % Apply 1 application  topically See admin instructions. Apply around port site as directed 04/05/21  Yes [provider]  lactulose  (CHRONULAC ) 10 GM/15ML solution TAKE 30 MLS (20 G TOTAL) BY MOUTH AS NEEDED. 11/02/20  Yes Pyrtle, Gordy HERO, MD  latanoprost  (XALATAN ) 0.005 % ophthalmic solution Place 1 drop into both eyes in the morning.   Yes [provider]  loratadine  (CLARITIN ) 10 MG tablet TAKE 1 TABLET BY MOUTH EVERY DAY 04/18/24  Yes Kaur, Charanpreet, NP  metoCLOPramide  (REGLAN ) 10 MG tablet TAKE 1 TABLET (10 MG TOTAL) BY MOUTH AT BEDTIME. PLEASE KEEP YOUR DECEMBER APPOINTMENT FOR FURTHER REFILLS. 04/22/24  Yes McMichael, Bayley M, PA-C  midodrine  (PROAMATINE ) 5 MG tablet Take 5 mg by  mouth 2 (two) times daily as needed (if the Systolic number is less than 100). 09/27/20  Yes [provider]  mometasone  (ELOCON ) 0.1 % cream Apply 1 Application topically as directed. Qd to bid to aa right ankle, right lower leg until improved, then prn flares Patient taking differently: Apply 1 Application topically See admin instructions. Apply to affected area of the ankle and lower leg 1-2 times a day until improvement is seen, then as needed for flares 03/30/23  Yes Stewart, Tara, MD  Multiple Vitamin (MULTIVITAMIN ADULT PO) Take 1 tablet by mouth daily.   Yes [provider]  ondansetron  (ZOFRAN -ODT) 4 MG disintegrating tablet Take 4 mg by mouth every 8 (eight) hours as needed for nausea or vomiting (dissolve orally).   Yes [provider]  pantoprazole  (PROTONIX ) 40 MG tablet TAKE 1 TABLET BY MOUTH TWICE A DAY 10/09/23  Yes Kaur, Charanpreet, NP  PARoxetine (PAXIL) 10 MG tablet Take 10 mg by mouth every morning.   Yes [provider]  Potassium Chloride  CR (MICRO-K ) 8 MEQ CPCR capsule CR Take 1 capsule by mouth 2 (two) times daily. 03/11/24  Yes [provider]  torsemide  (DEMADEX ) 20 MG tablet Take 40 mg by mouth 2 (two) times daily. At breakfast and lunchtime   Yes [provider]  Vitamin D , Ergocalciferol , (DRISDOL) 1.25 MG (50000 UNIT) CAPS capsule Take 50,000 Units by mouth every 14 (fourteen) days.   Yes [provider]  atorvastatin  (LIPITOR ) 80 MG tablet Take 1 tablet (80 mg total) by mouth at bedtime. Patient not taking: Reported on 04/21/2024 08/06/23   Kaur, Charanpreet, NP  azelastine  (ASTELIN ) 0.1 % nasal spray Place 2 sprays into both nostrils 2 (two) times daily. Use in each nostril as directed Patient not taking: Reported on 04/25/2024 08/30/23   Regalado, Owen A, MD  Continuous Glucose Sensor (DEXCOM G7 SENSOR) MISC Change sensor every 10 days 09/08/23   Shamleffer, Ibtehal Jaralla, MD  Efinaconazole  (JUBLIA ) 10 % SOLN  Apply topically to affected toenails at night for onychomycosis Patient not taking: Reported on 04/25/2024 11/19/23   Jackquline Sawyer, MD  guaiFENesin  (MUCINEX ) 600 MG 12 hr tablet Take 1 tablet (600 mg total) by mouth 2 (two) times daily as needed for to loosen phlegm. Patient not taking: Reported on 04/21/2024 08/30/23   Regalado, Belkys A, MD  insulin  degludec (TRESIBA  FLEXTOUCH) 200 UNIT/ML FlexTouch Pen INJECT 42 UNITS INTO THE  SKIN IN THE MORNING AND AT BEDTIME. 08/31/23   Shamleffer, Ibtehal Jaralla, MD  insulin  lispro (HUMALOG ) 100 UNIT/ML KwikPen MAX DAILY 45 UNITS 09/28/23   Shamleffer, Ibtehal Jaralla, MD  Insulin  NPH, Human,, Isophane, (HUMULIN  N KWIKPEN) 100 UNIT/ML Kiwkpen INJECT 70 UNITS INTO THE SKIN DAILY IN THE AFTERNOON. TO BE TAKEN AT THE START OF DIALYSIS 01/04/24   Shamleffer, Donell Cardinal, MD  KLOR-CON  M20 20 MEQ tablet Take 20 mEq by mouth in the morning. Patient not taking: Reported on 04/25/2024    [provider]  senna (SENOKOT) 8.6 MG tablet Take 2 tablets by mouth in the morning. Patient not taking: Reported on 04/25/2024    [provider]  sevelamer  carbonate (RENVELA ) 800 MG tablet Take 1 tablet (800 mg total) by mouth 3 (three) times daily with meals. Patient not taking: Reported on 04/21/2024 08/30/23   Regalado, Belkys A, MD  terbinafine  (LAMISIL ) 250 MG tablet Take 1 tablet daily Patient not taking: Reported on 04/21/2024 11/23/23   Jackquline Sawyer, MD  Testosterone  20.25 MG/ACT (1.62%) GEL Place 2 Pump onto the skin daily in the afternoon. Patient not taking: Reported on 04/25/2024 05/15/23   Shamleffer, Ibtehal Jaralla, MD      DVT/GI PRX  assessed I Assessed the need for Labs I Assessed the need for Foley I Assessed the need for Central Venous Line Family Discussion when available I Assessed the need for Mobilization I made an Assessment of medications to be adjusted accordingly Safety Risk assessment completed  CASE DISCUSSED IN MULTIDISCIPLINARY  ROUNDS WITH ICU TEAM     Critical Care Time devoted to patient care services described in this note is 55 minutes.  Critical care was necessary to treat /prevent imminent and life-threatening deterioration. Overall, patient is critically ill, prognosis is guarded.  Patient with Multiorgan failure and at high risk for cardiac arrest and death.    Nickolas Alm Cellar, M.D.  [facility]  Medical Director [facility] [place]            [1]  Allergies Allergen Reactions   Baclofen Other (See Comments)    Disorientation, Contraindication with dialysis    Nsaids Other (See Comments)    CKD on dialysis    "

## 2024-04-27 NOTE — Plan of Care (Signed)
" °  Problem: Nutritional: Goal: Maintenance of adequate nutrition will improve Outcome: Progressing   Problem: Skin Integrity: Goal: Risk for impaired skin integrity will decrease Outcome: Progressing   Problem: Clinical Measurements: Goal: Will remain free from infection Outcome: Progressing Goal: Cardiovascular complication will be avoided Outcome: Progressing   "

## 2024-04-27 NOTE — Progress Notes (Addendum)
 PIV consult: No suitable veins found LUE. Long 22g placed R forearm. Please consider central line for prolonged venous access due to limited PIV options. Pt not a PICC candidate due to renal issues. Discussed with Suzen, RN.

## 2024-04-27 NOTE — Progress Notes (Signed)
 PHARMACY - ANTICOAGULATION CONSULT NOTE  Pharmacy Consult for [facility] infusion Indication: chest pain/ACS  Allergies[1]  Patient Measurements: Height: 5' 10 (177.8 cm) Weight: 110.1 kg (242 lb 11.6 oz) IBW/kg (Calculated) : 73 HEPARIN  DW (KG): 96.4  Vital Signs: Temp: 100.2 F (37.9 C) (01/14 0600) Temp Source: Esophageal (01/13 2000) BP: 137/69 (01/14 0600) Pulse Rate: 74 (01/14 0600)  Labs: Recent Labs    04/25/24 1342 04/25/24 2055 04/25/24 2304 04/26/24 0636 04/26/24 2021 04/27/24 0507  HGB 10.6*  --  9.5* 9.6*  --  8.3*  HCT 33.7*  --  28.0* 29.2*  --  25.7*  PLT 276  --  259 245  --  225  APTT  --  26  --   --   --   --   LABPROT  --  19.4*  --   --   --   --   INR  --  1.6*  --   --   --   --   HEPARINUNFRC  --   --   --  <0.10* 0.26* 0.30  CREATININE 8.70*  --   --  9.51*  --   --     Estimated Creatinine Clearance: 10.3 mL/min (A) (by C-G formula based on SCr of 9.51 mg/dL (H)).   Medical History: Past Medical History:  Diagnosis Date   Anemia    Arthritis    Asthma    as child   Atypical mole 09/22/2022   R spinal mid back, needs excision   Barrett's esophagus 02/16/2014   short segment   Cataract    Coronary artery disease    DDD (degenerative disc disease), cervical    Diabetes mellitus without complication (HCC)    Diabetic osteomyelitis (HCC)    Diabetic peripheral neuropathy (HCC)    Diabetic ulcer of foot with muscle involvement without evidence of necrosis (HCC)    DIABETIC ULCERATIONS ASSOCIATED WITH IRRITATION LATERAL ANKLE LEFT GREATER THAN RIGHT WITH MILD CELLULITIS    Diverticulosis    DKA (diabetic ketoacidoses) 08/23/2017   Edema, lower extremity    ESRD on hemodialysis (HCC)    Fatty liver    Gastroparesis 2015   GERD (gastroesophageal reflux disease)    Gilbert's disease    Glaucoma    Hx of BKA, left (HCC)    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   PONV (postoperative nausea and vomiting)    PVD (peripheral vascular disease)    Seasonal allergies    Shoulder pain    Tubular adenoma of colon    Ulcer of foot (HCC) 11/17/2012   DIABETIC ULCERATIONS ASSOCIATED WITH IRRITATION LATERAL ANKLE LEFT GREATER THAN RIGHT WITH MILD CELLULITIS   Assessment: 59 y/o M presenting to the ED from HD with cardiac arrest. Pharmacy consulted to initiate heparin  infusion for ACS.   Date Time Results Comments 0113 0636 HL < 0.1 subthera; 1250 un/hr 0113 2021 HL 0.26 Subtherapeutic, 1600u/h 0114 0507 HL 0.30 Therapeutic x1  Goal of  Therapy:  Heparin  level 0.3-0.7 units/ml Monitor platelets by anticoagulation protocol: Yes   Plan:  --Heparin  level is therapeutic x1 on low end of goal range; CBC stable --Increase heparin  infusion rate slightly to 1800 units/hour to keep patient in goal range --Re-check HL 8 hours from rate change --Daily CBC per protocol while on IV heparin   Thank you for involving pharmacy in this patient's care.   Damien Napoleon, PharmD Clinical Pharmacist 04/27/2024 7:02 AM    [1]  Allergies Allergen Reactions   Baclofen Other (See Comments)    Disorientation, Contraindication with dialysis    Nsaids Other (See Comments)    CKD on dialysis

## 2024-04-27 NOTE — Progress Notes (Signed)
 " [facility]  ROUNDING NOTE   Subjective:   Patient with past medical history of ESRD originally on peritoneal dialysis transitioning to HHD, fatty liver, gastroparesis, GERD, Gilbert's disease, severe diabetes mellitus type 2, peripheral neuropathy, coronary artery disease with history of CABG and stent placement, left below the knee amputation, legally blind, history of diabetic foot ulcer who was transitioning to HHD but unfortunately suffered cardiac arrest x 20 minutes on first treatment.  Update: Patient remains critically ill at this time. MRI brain scheduled for this a.m. Still on the ventilator. Currently off pressors. Patient's sister at bedside this a.m.   Objective:  Vital signs in last 24 hours:  Temp:  [98.2 F (36.8 C)-100.6 F (38.1 C)] 99.5 F (37.5 C) (01/14 0742) Pulse Rate:  [67-83] 72 (01/14 0742) Resp:  [0-21] 12 (01/14 0742) BP: (63-146)/(43-86) 118/59 (01/14 0700) SpO2:  [91 %-100 %] 96 % (01/14 0742) FiO2 (%):  [35 %] 35 % (01/14 0742) Weight:  [110.1 kg] 110.1 kg (01/14 0500)  Weight change: -1.984 kg Filed Weights   04/25/24 2230 04/26/24 0415 04/27/24 0500  Weight: 108.4 kg 108.4 kg 110.1 kg    Intake/Output: I/O last 3 completed shifts: In: 2194 [I.V.:1088.4; NG/GT:895.7; IV Piggyback:210] Out: 525 [Urine:525]   Intake/Output this shift:  No intake/output data recorded.  Physical Exam: General: Critically ill-appearing  Head: Normocephalic, atraumatic.  Endotracheal tube in place  Neck: Supple  Lungs:  Scattered rhonchi  Heart: S1S2 no rubs  Abdomen:  Soft, nontender, bowel sounds present  Extremities: Left BKA  Neurologic: Intubated, not following commands  Skin: No acute rash  Access: R internal jugular PC     Basic Metabolic Panel: Recent Labs  Lab 04/25/24 1342 04/25/24 2055 04/26/24 0636 04/27/24 0507  NA 129*  --  126* 128*  K 4.6  --  4.1 4.2  CL 91*  --  87* 90*  CO2 16*  --  23 22  GLUCOSE 175*  --   191* 198*  BUN 49*  --  57* 73*  CREATININE 8.70*  --  9.51* 11.50*  CALCIUM  9.1  --  8.8* 8.6*  MG  --  1.7 1.8 2.4  PHOS  --  6.4* 6.7* 6.9*    Liver Function Tests: Recent Labs  Lab 04/25/24 1342 04/26/24 0636  AST 290* 113*  ALT 163* 109*  ALKPHOS 245* 223*  BILITOT 0.5 0.3  PROT 6.8 6.5  ALBUMIN  2.8* 2.8*   No results for input(s): LIPASE, AMYLASE in the last 168 hours. No results for input(s): AMMONIA in the last 168 hours.  CBC: Recent Labs  Lab 04/25/24 1342 04/25/24 2304 04/26/24 0636 04/27/24 0507  WBC 18.9* 15.1* 12.6* 12.9*  HGB 10.6* 9.5* 9.6* 8.3*  HCT 33.7* 28.0* 29.2* 25.7*  MCV 112.3* 107.7* 107.4* 106.6*  PLT 276 259 245 225    Cardiac Enzymes: No results for input(s): CKTOTAL, CKMB, CKMBINDEX, TROPONINI in the last 168 hours.  BNP: Invalid input(s): POCBNP  CBG: Recent Labs  Lab 04/26/24 1631 04/26/24 2011 04/26/24 2333 04/27/24 0329 04/27/24 0749  GLUCAP 177* 170* 176* 190* 191*    Microbiology: Results for orders placed or performed during the hospital encounter of 04/25/24  Resp panel by RT-PCR (RSV, Flu A&B, Covid) Anterior Nasal Swab     Status: None   Collection Time: 04/25/24  3:24 PM   Specimen: Anterior Nasal Swab  Result Value Ref Range Status   SARS Coronavirus 2 by RT PCR NEGATIVE NEGATIVE Final  Comment: (NOTE) SARS-CoV-2 target nucleic acids are NOT DETECTED.  The SARS-CoV-2 RNA is generally detectable in upper respiratory specimens during the acute phase of infection. The lowest concentration of SARS-CoV-2 viral copies this assay can detect is 138 copies/mL. A negative result does not preclude SARS-Cov-2 infection and should not be used as the sole basis for treatment or other patient management decisions. A negative result may occur with  improper specimen collection/handling, submission of specimen other than nasopharyngeal swab, presence of viral mutation(s) within the areas targeted by this  assay, and inadequate number of viral copies(<138 copies/mL). A negative result must be combined with clinical observations, patient history, and epidemiological information. The expected result is Negative.  Fact Sheet for Patients:  bloggercourse.com  Fact Sheet for Healthcare Providers:  seriousbroker.it  This test is no t yet approved or cleared by the [place]  FDA and  has been authorized for detection and/or diagnosis of SARS-CoV-2 by FDA under an Emergency Use Authorization (EUA). This EUA will remain  in effect (meaning this test can be used) for the duration of the COVID-19 declaration under Section 564(b)(1) of the Act, 21 U.S.C.section 360bbb-3(b)(1), unless the authorization is terminated  or revoked sooner.       Influenza A by PCR NEGATIVE NEGATIVE Final   Influenza B by PCR NEGATIVE NEGATIVE Final    Comment: (NOTE) The Xpert Xpress SARS-CoV-2/FLU/RSV plus assay is intended as an aid in the diagnosis of influenza from Nasopharyngeal swab specimens and should not be used as a sole basis for treatment. Nasal washings and aspirates are unacceptable for Xpert Xpress SARS-CoV-2/FLU/RSV testing.  Fact Sheet for Patients: bloggercourse.com  Fact Sheet for Healthcare Providers: seriousbroker.it  This test is not yet approved or cleared by the [place]  FDA and has been authorized for detection and/or diagnosis of SARS-CoV-2 by FDA under an Emergency Use Authorization (EUA). This EUA will remain in effect (meaning this test can be used) for the duration of the COVID-19 declaration under Section 564(b)(1) of the Act, 21 U.S.C. section 360bbb-3(b)(1), unless the authorization is terminated or revoked.     Resp Syncytial Virus by PCR NEGATIVE NEGATIVE Final    Comment: (NOTE) Fact Sheet for Patients: bloggercourse.com  Fact Sheet for  Healthcare Providers: seriousbroker.it  This test is not yet approved or cleared by the [place]  FDA and has been authorized for detection and/or diagnosis of SARS-CoV-2 by FDA under an Emergency Use Authorization (EUA). This EUA will remain in effect (meaning this test can be used) for the duration of the COVID-19 declaration under Section 564(b)(1) of the Act, 21 U.S.C. section 360bbb-3(b)(1), unless the authorization is terminated or revoked.  Performed at [facility], [address] [place], [place] [address]   MRSA Next Gen by PCR, Nasal     Status: Abnormal   Collection Time: 04/25/24 10:19 PM   Specimen: Nasal Mucosa; Nasal Swab  Result Value Ref Range Status   MRSA by PCR Next Gen DETECTED (A) NOT DETECTED Final    Comment: RESULT CALLED TO, READ BACK BY AND VERIFIED WITH:  ABIGAIL SANTOS AT 0050 04/26/24 JG (NOTE) The GeneXpert MRSA Assay (FDA approved for NASAL specimens only), is one component of a comprehensive MRSA colonization surveillance program. It is not intended to diagnose MRSA infection nor to guide or monitor treatment for MRSA infections. Test performance is not FDA approved in patients less than 60 years old. Performed at [facility], [address], [place], [place] [address]   Culture, blood (  Routine X 2) w Reflex to ID Panel     Status: None (Preliminary result)   Collection Time: 04/25/24 11:04 PM   Specimen: BLOOD RIGHT HAND  Result Value Ref Range Status   Specimen Description   Final    BLOOD RIGHT HAND Performed at [facility] [address], [place], [place] [address]    Special Requests   Final    BOTTLES DRAWN AEROBIC AND ANAEROBIC Blood Culture adequate volume Performed at [facility], [address] [place], [place] [address]    Culture   Final    NO GROWTH 1 DAY Performed at [facility], [address] [place], [place] [address]    Report  Status PENDING  Incomplete  Body fluid culture w Gram Stain     Status: None (Preliminary result)   Collection Time: 04/25/24 11:19 PM   Specimen: Peritoneal Cavity; Body Fluid  Result Value Ref Range Status   Specimen Description   Final    PERITONEAL CAVITY Performed at [facility], [address] [place], [place] [address]    Special Requests   Final    NONE Performed at [facility], [address] [place], [place] [address]    Gram Stain   Final    RARE WBC PRESENT, PREDOMINANTLY PMN NO ORGANISMS SEEN Performed at [facility], [address] [place], [place] [address]    Culture PENDING  Incomplete   Report Status PENDING  Incomplete  Culture, blood (Routine X 2) w Reflex to ID Panel     Status: None (Preliminary result)   Collection Time: 04/25/24 11:28 PM   Specimen: BLOOD RIGHT HAND  Result Value Ref Range Status   Specimen Description   Final    BLOOD RIGHT HAND Performed at [facility], [address], [place], [place] [address]    Special Requests   Final    BOTTLES DRAWN AEROBIC ONLY Blood Culture results may not be optimal due to an inadequate volume of blood received in culture bottles Performed at [facility], [address], [place], [place] [address]    Culture   Final    NO GROWTH 1 DAY Performed at [facility], [address] [place], [place] [address]    Report Status PENDING  Incomplete   *Note: Due to a large number of results and/or encounters for the requested time period, some results have not been displayed. A complete set of results can be found in Results Review.    Coagulation Studies: Recent Labs    04/25/24 2053/05/16  LABPROT 19.4*  INR 1.6*    Urinalysis: No results for input(s): COLORURINE, LABSPEC, PHURINE, GLUCOSEU, HGBUR, BILIRUBINUR, KETONESUR, PROTEINUR, UROBILINOGEN, NITRITE, LEUKOCYTESUR in the last 72 hours.  Invalid input(s): APPERANCEUR     Imaging: ECHOCARDIOGRAM COMPLETE Result Date: 04/26/2024    ECHOCARDIOGRAM REPORT   Patient Name:   WADIE DELENA LOUDER Date of Exam: 04/25/2024 Medical Rec #:  [id number]       Height:       70.0 in Accession #:    [id number]      Weight:       247.1 lb Date of Birth:  [birth year]       BSA:          2.284 m Patient Age:    58 years        BP:           128/77 mmHg Patient Gender:  M               HR:           82 bpm. Exam Location:  [facility] Procedure: 2D Echo, Cardiac Doppler and Color Doppler (Both Spectral and Color            Flow Doppler were utilized during procedure). Indications:     I46.9 Cardiac Arrest  History:         Patient has prior history of Echocardiogram examinations. CAD                  and Previous Myocardial Infarction, Prior CABG; Risk                  Factors:Hypertension, Diabetes and Dyslipidemia. End Stage                  Renal Disease-Dialysis. Peripheral vascular disease.  Sonographer:     Carl Coma RDCS Referring Phys:  [id number] LONELL KANDICE MOOSE Diagnosing Phys: Dwayne D Callwood MD IMPRESSIONS  1. Left ventricular ejection fraction, by estimation, is 30 to 35%. The left ventricle has moderately decreased function. The left ventricle demonstrates regional wall motion abnormalities (see scoring diagram/findings for description). The left ventricular internal cavity size was mildly dilated. There is mild concentric left ventricular hypertrophy. Left ventricular diastolic parameters are consistent with Grade I diastolic dysfunction (impaired relaxation).  2. Right ventricular systolic function is normal. The right ventricular size is mildly enlarged.  3. Left atrial size was moderately dilated.  4. Right atrial size was mildly dilated.  5. The mitral valve is grossly normal. Trivial mitral valve regurgitation.  6. The aortic valve is calcified. Aortic valve regurgitation is trivial. Aortic valve sclerosis/calcification is present, without any evidence of aortic stenosis. FINDINGS   Left Ventricle: Left ventricular ejection fraction, by estimation, is 30 to 35%. The left ventricle has moderately decreased function. The left ventricle demonstrates regional wall motion abnormalities. Strain was performed and the global longitudinal strain is indeterminate. The left ventricular internal cavity size was mildly dilated. There is mild concentric left ventricular hypertrophy. Left ventricular diastolic parameters are consistent with Grade I diastolic dysfunction (impaired relaxation). Right Ventricle: The right ventricular size is mildly enlarged. No increase in right ventricular wall thickness. Right ventricular systolic function is normal. Left Atrium: Left atrial size was moderately dilated. Right Atrium: Right atrial size was mildly dilated. Pericardium: There is no evidence of pericardial effusion. Mitral Valve: The mitral valve is grossly normal. Trivial mitral valve regurgitation. Tricuspid Valve: The tricuspid valve is normal in structure. Tricuspid valve regurgitation is mild. Aortic Valve: The aortic valve is calcified. Aortic valve regurgitation is trivial. Aortic valve sclerosis/calcification is present, without any evidence of aortic stenosis. Pulmonic Valve: The pulmonic valve was not well visualized. Pulmonic valve regurgitation is not visualized. Aorta: The aortic root was not well visualized. IAS/Shunts: No atrial level shunt detected by color flow Doppler. Additional Comments: 3D was performed not requiring image post processing on an independent workstation and was indeterminate.  LEFT VENTRICLE PLAX 2D LVIDd:         5.10 cm   Diastology LVIDs:         4.40 cm   LV e' medial:    4.79 cm/s LV PW:         1.40 cm   LV E/e' medial:  16.4 LV IVS:        1.20 cm   LV e' lateral:   4.51 cm/s LVOT  diam:     2.00 cm   LV E/e' lateral: 17.4 LV SV:         38 LV SV Index:   17 LVOT Area:     3.14 cm  RIGHT VENTRICLE            IVC RV Basal diam:  3.90 cm    IVC diam: 1.60 cm RV S prime:      9.31 cm/s TAPSE (M-mode): 1.6 cm LEFT ATRIUM             Index        RIGHT ATRIUM           Index LA diam:        4.70 cm 2.06 cm/m   RA Area:     16.60 cm LA Vol (A2C):   47.7 ml 20.89 ml/m  RA Volume:   44.30 ml  19.40 ml/m LA Vol (A4C):   34.6 ml 15.15 ml/m LA Biplane Vol: 41.3 ml 18.09 ml/m  AORTIC VALVE LVOT Vmax:   75.45 cm/s LVOT Vmean:  46.150 cm/s LVOT VTI:    0.122 m  AORTA Ao Root diam: 3.70 cm Ao Asc diam:  3.60 cm MITRAL VALVE MV Area (PHT): 4.13 cm    SHUNTS MV Decel Time: 184 msec    Systemic VTI:  0.12 m MV E velocity: 78.60 cm/s  Systemic Diam: 2.00 cm MV A velocity: 93.55 cm/s MV E/A ratio:  0.84 Dwayne D Callwood MD Electronically signed by Cara JONETTA Lovelace MD Signature Date/Time: 04/26/2024/2:59:25 PM    Final    US  Venous Img Lower Bilateral (DVT) Result Date: 04/26/2024 EXAM: ULTRASOUND DUPLEX OF THE BILATERAL LOWER EXTREMITY VEINS TECHNIQUE: Duplex ultrasound using B-mode/gray scaled imaging and Doppler spectral analysis and color flow was obtained of the deep venous structures of the bilateral lower extremity. COMPARISON: None available. CLINICAL HISTORY: 201733 DVT (deep venous thrombosis) (HCC) 798266 DVT (deep venous thrombosis) (HCC) FINDINGS: The common femoral vein, femoral vein, popliteal vein, and posterior tibial vein demonstrate normal compressibility with normal color flow and spectral analysis bilaterally . IMPRESSION: 1. No evidence of DVT bilaterally . Electronically signed by: Oneil Devonshire MD MD 04/26/2024 02:32 AM EST RP Workstation: [id number]   DG Chest Port 1 View Result Date: 04/25/2024 CLINICAL DATA:  Intubated EXAM: PORTABLE CHEST 1 VIEW COMPARISON:  04/25/2024 FINDINGS: Single frontal view of the chest demonstrates endotracheal tube overlying tracheal air column, tip approximately 10 cm above the carina. Enteric catheter passes below diaphragm, tip excluded by collimation. Right internal jugular dialysis catheter unchanged. Stable cardiac silhouette. No  airspace disease, effusion, or pneumothorax. No acute bony abnormalities. IMPRESSION: 1. Retraction of endotracheal tube since prior study, with tip now approximately 10 cm above carina. 2. No acute airspace disease. Electronically Signed   By: Ozell Daring M.D.   On: 04/25/2024 23:23   DG Chest Port 1 View Result Date: 04/25/2024 EXAM: 1 VIEW(S) XRAY OF THE CHEST 04/25/2024 07:06:16 PM COMPARISON: 04/25/2024 CLINICAL HISTORY: Endotracheal tube present FINDINGS: LINES, TUBES AND DEVICES: Endotracheal tube in place with tip at midthoracic trachea, 5.9 cm above the carina. Gastric tube in place, courses below diaphragm with tip outside field of view. Right IJ CVC in place with tip at [facility]. Defibrillator pads noted overlying chest. LUNGS AND PLEURA: Low lung volumes. No focal pulmonary opacity. No pleural effusion. No pneumothorax. HEART AND MEDIASTINUM: No acute abnormality of the cardiac and mediastinal silhouettes. BONES AND SOFT TISSUES: Prior sternotomy and CABG noted. No acute  osseous abnormality. IMPRESSION: 1. Endotracheal tube, gastric tube, and right IJ CVC in appropriate position. 2. No acute cardiopulmonary abnormality. Pulmonary hypoinflation 3. Prior sternotomy and CABG. Electronically signed by: Dorethia Molt MD MD 04/25/2024 08:13 PM EST RP Workstation: [id number]   CT CHEST ABDOMEN PELVIS W CONTRAST Result Date: 04/25/2024 EXAM: CT CHEST, ABDOMEN AND PELVIS WITH CONTRAST 04/25/2024 05:35:00 PM TECHNIQUE: CT of the chest, abdomen and pelvis was performed with the administration of 100 mL of iohexol  (OMNIPAQUE ) 300 MG/ML solution. Multiplanar reformatted images are provided for review. Automated exposure control, iterative reconstruction, and/or weight based adjustment of the mA/kV was utilized to reduce the radiation dose to as low as reasonably achievable. COMPARISON: 08/28/2023 CLINICAL HISTORY: Resuscitated arrest. FINDINGS: CHEST: MEDIASTINUM AND LYMPH NODES: Low lying endotracheal tube  positioned at the ostium of the right mainstem bronchus. 3 cm off retraction recommended. Borderline cardiomegaly. Dense multivessel coronary atherosclerosis with changes of a prior CABG procedure. Sternotomy wires. Tunneled right IJ approach hemodialysis catheter in place terminating in the high right atrium. The central airways are clear. No mediastinal, hilar or axillary lymphadenopathy. LUNGS AND PLEURA: Patchy dependent airspace opacities in both lungs with a small amount of central ground glass airspace opacity in the left upper lobe. Areas of dependent nodularity noted in the left lower lobe. Trace right pleural effusion. No pneumothorax. ABDOMEN AND PELVIS: LIVER: Cholecystectomy. GALLBLADDER AND BILE DUCTS: Unremarkable. No biliary ductal dilatation. SPLEEN: No acute abnormality. PANCREAS: No acute abnormality. ADRENAL GLANDS: No acute abnormality. KIDNEYS, URETERS AND BLADDER: Urinary bladder is completely decompressed with a urinary catheter in place. No stones in the kidneys or ureters. No hydronephrosis. No perinephric or periureteral stranding. GI AND BOWEL: Esophagogastric tube is well positioned terminating in the stomach. Scattered sigmoid diverticulosis. Normal appendix. There is no bowel obstruction. REPRODUCTIVE ORGANS: No acute abnormality. PERITONEUM AND RETROPERITONEUM: Well positioned peritoneal dialysis catheter terminating in the pelvis. Small volume free fluid in the abdomen, likely related to the peritoneal dialysate. No free air. VASCULATURE: Diffuse aortoiliac atherosclerosis. Aorta is normal in caliber. ABDOMINAL AND PELVIS LYMPH NODES: No lymphadenopathy. BONES AND SOFT TISSUES: Mildly displaced fractures of the right anterior 2nd and 5th ribs. Nondisplaced buckle type fractures of the anterior right 3rd, 4th, and 6th ribs. Nondisplaced buckle type fracture of the anterior left 3rd rib with a mildly displaced anterior left 4th rib fracture. Anterior buckle fractures of the anterior  left 5th and 6th ribs. Moderate volume symmetric bilateral gynecomastia. Small fat containing bilateral inguinal hernias. IMPRESSION: 1. Low lying endotracheal tube positioned at the ostium of the right mainstem bronchus. 3 cm of retraction is recommended. 2. Patchy dependent airspace opacities in both lungs with a small amount of central ground glass airspace opacity in the left upper lobe and areas of dependent nodularity in the left lower lobe. This may reflect changes of aspiration or a developing bronchopneumonia. 3. Multiple acute rib fractures bilaterally, as described above. Trace right pleural effusion. No pneumothorax. 4. Well-positioned esophagogastric and right IJ approach hemodialysis catheters. 5. Small volume ascites in the abdomen, likely related to peritoneal dialysate. 6. Scattered sigmoid diverticulosis. Electronically signed by: Rogelia Myers MD MD 04/25/2024 06:51 PM EST RP Workstation: [id number]   CT Head Wo Contrast Result Date: 04/25/2024 CLINICAL DATA:  Witnessed cardiac arrest status post resuscitation EXAM: CT HEAD WITHOUT CONTRAST TECHNIQUE: Contiguous axial images were obtained from the base of the skull through the vertex without intravenous contrast. RADIATION DOSE REDUCTION: This exam was performed according to the departmental dose-optimization program which includes  automated exposure control, adjustment of the mA and/or kV according to patient size and/or use of iterative reconstruction technique. COMPARISON:  07/26/2023 FINDINGS: Brain: No acute infarct or hemorrhage. Stable chronic small vessel ischemic changes within the left thalamus. Lateral ventricles and midline structures are unremarkable. No acute extra-axial fluid collections. No mass effect. Vascular: No hyperdense vessel or unexpected calcification. Skull: Normal. Negative for fracture or focal lesion. Sinuses/Orbits: Opacification of the right frontal, bilateral ethmoid, and sphenoid sinus, which has progressed  since prior study. Other: None. IMPRESSION: 1. No acute intracranial process. 2. Progressive chronic sinus disease. Electronically Signed   By: Ozell Daring M.D.   On: 04/25/2024 18:34   DG Abdomen 1 View Result Date: 04/25/2024 CLINICAL DATA:  Enteric catheter placement EXAM: ABDOMEN - 1 VIEW COMPARISON:  04/29/2023 FINDINGS: Frontal view of the lower chest and upper abdomen demonstrates enteric catheter passing below diaphragm, tip and side port projecting over the gastric fundus. Bowel gas pattern is unremarkable. IMPRESSION: 1. Enteric catheter tip projecting over the gastric fundus. Electronically Signed   By: Ozell Daring M.D.   On: 04/25/2024 15:17   DG Chest 1 View Result Date: 04/25/2024 CLINICAL DATA:  Intubated EXAM: CHEST  1 VIEW COMPARISON:  08/27/2023 FINDINGS: Single frontal view of the chest demonstrates endotracheal tube overlying tracheal air column, tip approximately 4 cm above carina. Enteric catheter passes below diaphragm, tip excluded by collimation. Right internal jugular dialysis catheter tip overlies superior vena cava. 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 Cardiac silhouette is unremarkable. No acute airspace disease, effusion, or pneumothorax. IMPRESSION: 1. Support devices as above. 2. No acute airspace disease. Electronically Signed   By: Ozell Daring M.D.   On: 04/25/2024 15:16     Medications:    cefTRIAXone  (ROCEPHIN )  IV Stopped (04/26/24 2046)   feeding supplement (PIVOT 1.5 CAL) 55 mL/hr at 04/27/24 0700   heparin  1,750 Units/hr (04/27/24 0700)   midazolam  4 mg/hr (04/27/24 0700)   norepinephrine  (LEVOPHED ) Adult infusion Stopped (04/26/24 2216)    artificial tears  1 drop Both Eyes BID   aspirin   81 mg Per Tube Daily   brimonidine   1 drop Both Eyes BID   Chlorhexidine  Gluconate Cloth  6 each Topical Daily   dorzolamide   1 drop Both Eyes BID   famotidine   10 mg Per Tube Daily   feeding supplement (PROSource TF20)  60 mL Per Tube Daily   free  water   30 mL Per Tube Q4H   insulin  aspart  0-6 Units Subcutaneous Q4H   latanoprost   1 drop Both Eyes q AM   multivitamin  1 tablet Per Tube QHS   mupirocin  ointment  1 Application Nasal BID   nitroGLYCERIN   1 inch Topical Q8H   mouth rinse  15 mL Mouth Rinse Q2H   polyethylene glycol  17 g Per Tube Daily   senna  1 tablet Per Tube BID   thiamine   100 mg Per Tube Daily   timolol   1 drop Both Eyes BID   acetaminophen , fentaNYL  (SUBLIMAZE ) injection, fentaNYL  (SUBLIMAZE ) injection, midazolam , mouth rinse, polyethylene glycol, senna  Assessment/ Plan:  59 y.o. male with past medical history of ESRD originally on peritoneal dialysis transitioning to HHD, fatty liver, gastroparesis, GERD, Gilbert's disease, severe diabetes mellitus type 2, peripheral neuropathy, coronary artery disease with history of CABG and stent placement, left below the knee amputation, legally blind, history of diabetic foot ulcer who was transitioning to HHD but unfortunately suffered cardiac arrest x 20 minutes on  first treatment.  1.  ESRD transitioning to HHD.  Patient apparently with failed PD treatments.  He was transitioning to HHD however on first HHD treatment as outpatient he suffered cardiac arrest x 20 minutes.   Update: We will proceed with CRRT today once MRI brain has been completed.  Patient to be placed on 4K bath.  Net fluid removal target.  2.  Acute respiratory failure/PEA arrest.  As above patient suffered cardiac arrest during first outpatient HHD treatment at the dialysis center.  Continue ventilatory support at this time.  MRI brain pending.  3.  Hypotension.  Patient currently off of norepinephrine .  4.  Anemia of chronic kidney disease.  Hemoglobin down a bit to 8.3.  Hold off on Epogen for now.   LOS: 2 Chase Clark 1/14/20268:01 AM   "

## 2024-04-27 NOTE — Procedures (Signed)
 Patient Name: Chase Clark  MRN: [id number]  Epilepsy Attending: Arlin MALVA Krebs  Referring Physician/Provider: Jerri Pfeiffer, MD  Date: 2024/05/01 Duration: 40.23 mins   Patient history: 59yo M s/p cardiac arrest. EEG to evaluate for seizure   Level of alertness:  comatose   AEDs during EEG study: Versed    Technical aspects: This EEG study was done with scalp electrodes positioned according to the 10-20 International system of electrode placement. Electrical activity was reviewed with band pass filter of 1-70Hz , sensitivity of 7 uV/mm, display speed of 31mm/sec with a 60Hz  notched filter applied as appropriate. EEG data were recorded continuously and digitally stored.  Video monitoring was available and reviewed as appropriate.   Description: EEG showed near continuous generalized 4-6hz  theta-delta slowing admixed with generalized spikes. Brief 1-3 seconds of generalized attenuation was also noted. Hyperventilation and photic stimulation were not performed.      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 Additionally there was generalized cerebral dysfunction (encephalopathy).  No definite seizures were noted   Gabreille Dardis MALVA Krebs

## 2024-04-27 NOTE — Progress Notes (Signed)
 Eeg done

## 2024-04-27 NOTE — Care Management Important Message (Signed)
 Important Message  Patient Details  Name: DEANGLEO PASSAGE MRN: [id number] Date of Birth: 1965-10-28   Important Message Given:  Yes - Medicare IM     Gracieann Stannard W, CMA 04/27/2024, 2:45 PM

## 2024-04-27 NOTE — Progress Notes (Signed)
 PHARMACY CONSULT NOTE - ELECTROLYTES  Pharmacy Consult for Electrolyte Monitoring and Replacement   Recent Labs: Height: 5' 10 (177.8 cm) Weight: 110.1 kg (242 lb 11.6 oz) IBW/kg (Calculated) : 73 Estimated Creatinine Clearance: 8.5 mL/min (A) (by C-G formula based on SCr of 11.5 mg/dL (H)). Potassium (mmol/L)  Date Value  04/27/2024 4.2  12/29/2013 4.5   Magnesium  (mg/dL)  Date Value  98/85/7973 2.4   Calcium  (mg/dL)  Date Value  98/85/7973 8.6 (L)   Albumin  (g/dL)  Date Value  98/86/7973 2.8 (L)   Phosphorus (mg/dL)  Date Value  98/85/7973 6.9 (H)   Sodium (mmol/L)  Date Value  04/27/2024 128 (L)  02/14/2019 140   Assessment  Chase Clark is a 59 y.o. male presenting with cardiac arrest. PMH significant for ESRD on dialysis. Pharmacy has been consulted to monitor and replace electrolytes.  Diet: NPO MIVF: none Pertinent medications: none  Goal of Therapy: Electrolytes WNL  Plan:  No electrolyte repletion indicated Check BMP, Mg with AM labs  Thank you for allowing pharmacy to be a part of this patient's care.  Belvie Macintosh, PharmD Candidate 04/27/2024 7:25 AM

## 2024-04-27 NOTE — Progress Notes (Signed)
 Return from MRI

## 2024-04-27 NOTE — Progress Notes (Signed)
 PHARMACY - ANTICOAGULATION CONSULT NOTE  Pharmacy Consult for UFH infusion Indication: chest pain/ACS  Allergies[1]  Patient Measurements: Height: 5' 10 (177.8 cm) Weight: 110.1 kg (242 lb 11.6 oz) IBW/kg (Calculated) : 73 HEPARIN  DW (KG): 96.4  Vital Signs: Temp: 100 F (37.8 C) (01/14 1900) BP: 143/64 (01/14 1900) Pulse Rate: 73 (01/14 1900)  Labs: Recent Labs    04/25/24 2055 04/25/24 2304 04/25/24 2304 04/26/24 0636 04/26/24 2021 04/27/24 0507 04/27/24 1754  HGB  --  9.5*  --  9.6*  --  8.3*  --   HCT  --  28.0*  --  29.2*  --  25.7*  --   PLT  --  259  --  245  --  225  --   APTT 26  --   --   --   --   --   --   LABPROT 19.4*  --   --   --   --   --   --   INR 1.6*  --   --   --   --   --   --   HEPARINUNFRC  --   --    < > <0.10* 0.26* 0.30 0.38  CREATININE  --   --   --  9.51*  --  11.50* 12.30*   < > = values in this interval not displayed.    Estimated Creatinine Clearance: 7.9 mL/min (A) (by C-G formula based on SCr of 12.3 mg/dL (H)).   Medical History: Past Medical History:  Diagnosis Date   Anemia    Arthritis    Asthma    as child   Atypical mole 09/22/2022   R spinal mid back, needs excision   Barrett's esophagus 02/16/2014   short segment   Cataract    Coronary artery disease    DDD (degenerative disc disease), cervical    Diabetes mellitus without complication (HCC)    Diabetic osteomyelitis (HCC)    Diabetic peripheral neuropathy (HCC)    Diabetic ulcer of foot with muscle involvement without evidence of necrosis (HCC)    DIABETIC ULCERATIONS ASSOCIATED WITH IRRITATION LATERAL ANKLE LEFT GREATER THAN RIGHT WITH MILD CELLULITIS    Diverticulosis    DKA (diabetic ketoacidoses) 08/23/2017   Edema, lower extremity    ESRD on hemodialysis (HCC)    Fatty liver    Gastroparesis 2015   GERD (gastroesophageal reflux disease)    Gilbert's disease    Glaucoma    Hx of BKA, left (HCC)    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   PONV (postoperative nausea and vomiting)    PVD (peripheral vascular disease)    Seasonal allergies    Shoulder pain    Tubular adenoma of colon    Ulcer of foot (HCC) 11/17/2012   DIABETIC ULCERATIONS ASSOCIATED WITH IRRITATION LATERAL ANKLE LEFT GREATER THAN RIGHT WITH MILD CELLULITIS   Assessment: 59 y/o M presenting to the ED from HD with cardiac arrest. Pharmacy consulted  to initiate heparin  infusion for ACS.   Date Time Results Comments 0113 0636 HL < 0.1 subthera; 1250 un/hr 0113 2021 HL 0.26 Subtherapeutic, 1600u/h 0114 0507 HL 0.30 Therapeutic x1 0114 ~ 1800    HL 0.38 Therapeutic x 2  Goal of Therapy:  Heparin  level 0.3-0.7 units/ml Monitor platelets by anticoagulation protocol: Yes   Plan:  --Heparin  level is therapeutic x2  --IContinue heparin  infusion rate at 1800 units/hour to keep patient in goal range --Daily HL/CBC per protocol while on IV heparin   Thank you for involving pharmacy in this patient's care.   Bari Glendia BIRCH 04/27/2024 7:57 PM     [1]  Allergies Allergen Reactions   Baclofen Other (See Comments)    Disorientation, Contraindication with dialysis    Nsaids Other (See Comments)    CKD on dialysis

## 2024-04-27 NOTE — Progress Notes (Signed)
 To MRI with transport, respiratory and this RN
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
## 2024-04-27 NOTE — Procedures (Addendum)
 Patient Name: Chase Clark  MRN: [id number]  Epilepsy Attending: Arlin MALVA Krebs  Referring Physician/Provider: Isaiah Scrivener, MD  Date: 04/27/2024 Duration: 40.23 mins  Patient history: 59yo M s/p cardiac arrest. EEG to evaluate for seizure  Level of alertness:  comatose  AEDs during EEG study: Versed   Technical aspects: This EEG study was done with scalp electrodes positioned according to the 10-20 International system of electrode placement. Electrical activity was reviewed with band pass filter of 1-70Hz , sensitivity of 7 uV/mm, display speed of 71mm/sec with a 60Hz  notched filter applied as appropriate. EEG data were recorded continuously and digitally stored.  Video monitoring was available and reviewed as appropriate.  Of note, report was delayed because it was assigned to a different physician yesterday  Description: Patient was noted to have episodes of brief sudden whole body jerking every few seconds. Concomitant EEG showed generalized polyspikes consistent with myoclonic seizures.  In between seizures EEG showed generalized background suppression.  Briefly myoclonic seizures abated.  EEG then showed near continuous generalized 3-5hz  theta-delta slowing admixed with generalized periodic discharges with triphasic morphology at 1 Hz.  Hyperventilation and photic stimulation were not performed.     ABNORMALITY - Myoclonic seizures, generalized - Periodic discharges with triphasic morphology, Generalized - Continuous Slow, Generalized  IMPRESSION: Patient was noted to have episodes of whole body jerking intermittently during the study consistent with myoclonic seizures.  Additionally study was suggestive of generalized cerebral dysfunction (encephalopathy).  In the setting of cardiac arrest, this EEG pattern is concerning for anoxic/hypoxic brain injury.  Please consider long-term EEG if clinically indicated.  Dr. Isaiah was notified.  Chase Clark

## 2024-04-28 DIAGNOSIS — G4089 Other seizures: Secondary | ICD-10-CM | POA: Diagnosis not present

## 2024-04-28 DIAGNOSIS — Z992 Dependence on renal dialysis: Secondary | ICD-10-CM | POA: Diagnosis not present

## 2024-04-28 DIAGNOSIS — N186 End stage renal disease: Secondary | ICD-10-CM

## 2024-04-28 DIAGNOSIS — E1122 Type 2 diabetes mellitus with diabetic chronic kidney disease: Secondary | ICD-10-CM | POA: Diagnosis not present

## 2024-04-28 DIAGNOSIS — I132 Hypertensive heart and chronic kidney disease with heart failure and with stage 5 chronic kidney disease, or end stage renal disease: Secondary | ICD-10-CM | POA: Diagnosis not present

## 2024-04-28 DIAGNOSIS — I509 Heart failure, unspecified: Secondary | ICD-10-CM | POA: Diagnosis not present

## 2024-04-28 DIAGNOSIS — I214 Non-ST elevation (NSTEMI) myocardial infarction: Secondary | ICD-10-CM | POA: Diagnosis not present

## 2024-04-28 DIAGNOSIS — I469 Cardiac arrest, cause unspecified: Secondary | ICD-10-CM | POA: Diagnosis not present

## 2024-04-28 LAB — RENAL FUNCTION PANEL
Albumin: 2.6 g/dL — ABNORMAL LOW (ref 3.5–5.0)
Anion gap: 20 — ABNORMAL HIGH (ref 5–15)
BUN: 98 mg/dL — ABNORMAL HIGH (ref 6–20)
CO2: 20 mmol/L — ABNORMAL LOW (ref 22–32)
Calcium: 8.6 mg/dL — ABNORMAL LOW (ref 8.9–10.3)
Chloride: 88 mmol/L — ABNORMAL LOW (ref 98–111)
Creatinine, Ser: 12.8 mg/dL — ABNORMAL HIGH (ref 0.61–1.24)
GFR, Estimated: 4 mL/min — ABNORMAL LOW
Glucose, Bld: 267 mg/dL — ABNORMAL HIGH (ref 70–99)
Phosphorus: 7.6 mg/dL — ABNORMAL HIGH (ref 2.5–4.6)
Potassium: 4.7 mmol/L (ref 3.5–5.1)
Sodium: 129 mmol/L — ABNORMAL LOW (ref 135–145)

## 2024-04-28 LAB — CBC
HCT: 24.3 % — ABNORMAL LOW (ref 39.0–52.0)
Hemoglobin: 7.9 g/dL — ABNORMAL LOW (ref 13.0–17.0)
MCH: 35 pg — ABNORMAL HIGH (ref 26.0–34.0)
MCHC: 32.5 g/dL (ref 30.0–36.0)
MCV: 107.5 fL — ABNORMAL HIGH (ref 80.0–100.0)
Platelets: 211 K/uL (ref 150–400)
RBC: 2.26 MIL/uL — ABNORMAL LOW (ref 4.22–5.81)
RDW: 13.6 % (ref 11.5–15.5)
WBC: 12.8 K/uL — ABNORMAL HIGH (ref 4.0–10.5)
nRBC: 0 % (ref 0.0–0.2)

## 2024-04-28 LAB — MAGNESIUM: Magnesium: 2.4 mg/dL (ref 1.7–2.4)

## 2024-04-28 LAB — HEPARIN LEVEL (UNFRACTIONATED)
Heparin Unfractionated: 0.29 [IU]/mL — ABNORMAL LOW (ref 0.30–0.70)
Heparin Unfractionated: 0.61 [IU]/mL (ref 0.30–0.70)

## 2024-04-28 LAB — GLUCOSE, CAPILLARY
Glucose-Capillary: 266 mg/dL — ABNORMAL HIGH (ref 70–99)
Glucose-Capillary: 252 mg/dL — ABNORMAL HIGH (ref 70–99)
Glucose-Capillary: 305 mg/dL — ABNORMAL HIGH (ref 70–99)

## 2024-04-28 MED ORDER — GLYCOPYRROLATE 0.2 MG/ML IJ SOLN
0.2000 mg | INTRAMUSCULAR | Status: DC | PRN
  Administered 2024-04-28: 0.2 mg via INTRAVENOUS

## 2024-04-28 MED ORDER — GLYCOPYRROLATE 0.2 MG/ML IJ SOLN: 0.2000 mg | INTRAMUSCULAR | Status: DC

## 2024-04-28 MED ORDER — MORPHINE 100MG IN NS 100ML (1MG/ML) PREMIX INFUSION
2.0000 mg/h | INTRAVENOUS | Status: DC
  Administered 2024-04-28: 2 mg/h via INTRAVENOUS
  Filled 2024-04-28: qty 100

## 2024-04-28 MED ORDER — INSULIN ASPART 100 UNIT/ML IJ SOLN
0.0000 [IU] | INTRAMUSCULAR | Status: DC
  Administered 2024-04-28: 5 [IU] via SUBCUTANEOUS
  Administered 2024-04-28: 7 [IU] via SUBCUTANEOUS
  Administered 2024-04-28: 5 [IU] via SUBCUTANEOUS
  Filled 2024-04-28: qty 7
  Filled 2024-04-28: qty 5

## 2024-04-28 MED ORDER — LORAZEPAM 2 MG/ML IJ SOLN
2.0000 mg | INTRAMUSCULAR | Status: DC | PRN
  Administered 2024-04-28 (×3): 2 mg via INTRAVENOUS
  Filled 2024-04-28 (×3): qty 1

## 2024-04-28 MED ORDER — HEPARIN BOLUS VIA INFUSION
1400.0000 [IU] | Freq: Once | INTRAVENOUS | Status: AC
  Administered 2024-04-28: 1400 [IU] via INTRAVENOUS
  Filled 2024-04-28: qty 1400

## 2024-04-29 LAB — BODY FLUID CULTURE W GRAM STAIN: Culture: NO GROWTH

## 2024-05-01 LAB — CULTURE, BLOOD (ROUTINE X 2)
Culture: NO GROWTH
Culture: NO GROWTH
Special Requests: ADEQUATE

## 2024-05-02 ENCOUNTER — Encounter: Payer: Self-pay | Admitting: *Deleted

## 2024-05-02 NOTE — Progress Notes (Signed)
 Chase Clark                                          MRN: [id number]   05/02/2024   The [facility] Quality Team Specialist reviewed this patient medical record for the purposes of chart review for care gap closure. The following were reviewed: chart review for care gap closure-glycemic status assessment.    [facility] Quality Team

## 2024-05-06 NOTE — Progress Notes (Signed)
 Chase Clark                                          MRN: [id number]   05/06/2024   The [facility] Quality Team Specialist reviewed this patient medical record for the purposes of chart review for care gap closure. The following were reviewed: chart review for care gap closure-glycemic status assessment.    [facility] Quality Team

## 2024-05-15 NOTE — Inpatient Diabetes Management (Addendum)
 Inpatient Diabetes Program Recommendations  AACE/ADA: New Consensus Statement on Inpatient Glycemic Control (2015)  Target Ranges:  Prepandial:   less than 140 mg/dL      Peak postprandial:   less than 180 mg/dL (1-2 hours)      Critically ill patients:  140 - 180 mg/dL    Latest Reference Range & Units 04/26/24 23:33 04/27/24 03:29 04/27/24 07:49 04/27/24 16:49 04/27/24 19:33  Glucose-Capillary 70 - 99 mg/dL 823 (H) 809 (H) 808 (H) 262 (H) 248 (H)  (H): Data is abnormally high  Latest Reference Range & Units 04/27/24 23:12 2024-05-04 03:03 2024-05-04 08:29  Glucose-Capillary 70 - 99 mg/dL 748 (H) 733 (H) 747 (H)  (H): Data is abnormally high   History: Type 1 diabetes (per ENDO notes)  Home DM Meds: Dexcom G7 CGM        Tresiba  42 units BID        Humalog          NPH Insulin  70 units in the afternoon at the start of Dialysis  Current Orders: Novolog  Sensitive Correction Scale/ SSI (0-9 units) Q4H    MD- Note pt takes basal insulin  at home: Tresiba  42 units BID Getting Tube Feeds 45cc/hr (goal rate 55cc/hr) CBGs now >200  May consider:  1. Start weight based basal insulin : Lantus  9 units BID (~0.15 units/kg split dose)  2. Once tube feeds reach goal rate of 55cc/hr, please add low dose Novolog  Tube Feed Coverage: Novolog  2 units Q4H HOLD if tube feeds HELD for any reason    --Will follow patient during hospitalization--  Adina Rudolpho Arrow RN, MSN, CDCES Diabetes Coordinator Inpatient Glycemic Control Team Team Pager: [phone] (8a-5p)

## 2024-05-15 NOTE — Progress Notes (Addendum)
 STROKE TEAM PROGRESS NOTE   SUBJECTIVE (INTERVAL HISTORY) His wife, sister and daughters are at the bedside.  He was off sedation this morning, on SBT with PS/CPAP, per family, initially breathing OK, but with turning, he became tachypnic and increased work of breathing. EEG yesterday showed spikes, will repeat EEG today. Overnight, no myoclonic jerking.    OBJECTIVE Temp:  [97.7 F (36.5 C)-100 F (37.8 C)] 99.3 F (37.4 C) 04/30/2024 1300) Pulse Rate:  [64-84] 81 04-30-24 1300) Cardiac Rhythm: Normal sinus rhythm 04-30-24 1200) Resp:  [8-27] 27 April 30, 2024 1300) BP: (112-156)/(50-81) 135/68 30-Apr-2024 1300) SpO2:  [92 %-99 %] 97 % Apr 30, 2024 1300) FiO2 (%):  [30 %-35 %] 30 % April 30, 2024 1100) Weight:  [113.8 kg] 113.8 kg April 30, 2024 0500)  Recent Labs  Lab 04/27/24 1933 04/27/24 2312 04/30/24 0303 04/30/2024 0829 04-30-24 1137  GLUCAP 248* 251* 266* 252* 305*   Recent Labs  Lab 04/25/24 1342 04/25/24 2055 04/26/24 0636 04/27/24 0507 04/27/24 1754 2024-04-30 0342  NA 129*  --  126* 128* 128* 129*  K 4.6  --  4.1 4.2 4.9 4.7  CL 91*  --  87* 90* 88* 88*  CO2 16*  --  23 22 21* 20*  GLUCOSE 175*  --  191* 198* 277* 267*  BUN 49*  --  57* 73* 89* 98*  CREATININE 8.70*  --  9.51* 11.50* 12.30* 12.80*  CALCIUM  9.1  --  8.8* 8.6* 8.4* 8.6*  MG  --  1.7 1.8 2.4  --  2.4  PHOS  --  6.4* 6.7* 6.9* 7.5* 7.6*   Recent Labs  Lab 04/25/24 1342 04/26/24 0636 04/27/24 1754 2024-04-30 0342  AST 290* 113*  --   --   ALT 163* 109*  --   --   ALKPHOS 245* 223*  --   --   BILITOT 0.5 0.3  --   --   PROT 6.8 6.5  --   --   ALBUMIN  2.8* 2.8* 2.8* 2.6*   Recent Labs  Lab 04/25/24 1342 04/25/24 2304 04/26/24 0636 04/27/24 0507 2024/04/30 0342  WBC 18.9* 15.1* 12.6* 12.9* 12.8*  HGB 10.6* 9.5* 9.6* 8.3* 7.9*  HCT 33.7* 28.0* 29.2* 25.7* 24.3*  MCV 112.3* 107.7* 107.4* 106.6* 107.5*  PLT 276 259 245 225 211   No results for input(s): CKTOTAL, CKMB, CKMBINDEX, TROPONINI in the last 168 hours. Recent  Labs    04/25/24 2055  LABPROT 19.4*  INR 1.6*   No results for input(s): COLORURINE, LABSPEC, PHURINE, GLUCOSEU, HGBUR, BILIRUBINUR, KETONESUR, PROTEINUR, UROBILINOGEN, NITRITE, LEUKOCYTESUR in the last 72 hours.  Invalid input(s): APPERANCEUR     Component Value Date/Time   CHOL 124 09/16/2022 0935   TRIG 365.0 (H) 09/16/2022 0935   HDL 27.40 (L) 09/16/2022 0935   CHOLHDL 5 09/16/2022 0935   VLDL 73.0 (H) 09/16/2022 0935   LDLCALC 55 05/24/2019 0905   Lab Results  Component Value Date   HGBA1C 6.2 (H) 04/25/2024      Component Value Date/Time   LABOPIA NONE DETECTED 08/23/2017 1624   COCAINSCRNUR NONE DETECTED 08/23/2017 1624   LABBENZ NONE DETECTED 08/23/2017 1624   AMPHETMU NONE DETECTED 08/23/2017 1624   THCU NONE DETECTED 08/23/2017 1624   LABBARB NONE DETECTED 08/23/2017 1624    No results for input(s): ETH in the last 168 hours.  I have personally reviewed the radiological images below and agree with the radiology interpretations.  DG Chest Port 1 View Result Date: 04/27/2024 CLINICAL DATA:  Respiratory failure EXAM: PORTABLE  CHEST 1 VIEW COMPARISON:  04/25/2024 FINDINGS: Single frontal view of the chest demonstrates endotracheal tube overlying tracheal air column, tip 3.5 cm above carina. Enteric catheter passes below diaphragm tip excluded by collimation. Right internal jugular dialysis catheter tip overlies superior vena cava. Cardiac silhouette is enlarged but stable. Lung volumes are diminished, with continued crowding of the central pulmonary vasculature. No effusion or pneumothorax. No acute bony abnormalities. IMPRESSION: 1. Support devices as above. 2. No acute intrathoracic process. Electronically Signed   By: Ozell Daring M.D.   On: 04/27/2024 17:20   EEG adult Result Date: 04/27/2024 Shelton Arlin KIDD, MD     04/27/2024  5:21 PM Patient Name: Chase Clark MRN: [id number] Epilepsy Attending: Arlin KIDD Shelton Referring  Physician/Provider: Jerri Pfeiffer, MD Date: 05-25-24 Duration: 40.23 mins  Patient history: 59yo M s/p cardiac arrest. EEG to evaluate for seizure  Level of alertness:  comatose  AEDs during EEG study: Versed   Technical aspects: This EEG study was done with scalp electrodes positioned according to the 10-20 International system of electrode placement. Electrical activity was reviewed with band pass filter of 1-70Hz , sensitivity of 7 uV/mm, display speed of 21mm/sec with a 60Hz  notched filter applied as appropriate. EEG data were recorded continuously and digitally stored.  Video monitoring was available and reviewed as appropriate.  Description: EEG showed near continuous generalized 4-6hz  theta-delta slowing admixed with generalized spikes. Brief 1-3 seconds of generalized attenuation was also noted. Hyperventilation and photic stimulation were not performed.    ABNORMALITY - Spikes, Generalized - Continuous slow, Generalized  IMPRESSION: This study showed evidence of epileptogenicity with generalized onset. Additionally there was generalized cerebral dysfunction (encephalopathy).  No definite seizures were noted  Arlin KIDD Shelton    MR BRAIN WO CONTRAST Result Date: 04/27/2024 HISTORY: Mental status change, unknown cause; cardiac arrest, possible anoxic injury EXAM: MR BRAIN WITHOUT CONTRAST TECHNIQUE: Multiplanar, multiecho pulse sequences of the brain and surrounding structures were obtained without intravenous contrast. COMPARISON:  CT head 04/25/2024, MR head 07/26/2023 FINDINGS: Brain: There is no evidence of acute intracranial hemorrhage, extra-axial fluid collection, or acute infarct. There is no evidence of global anoxic injury. Mild parenchymal volume loss is unchanged. The ventricles are stable in size. 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 Parenchymal signal is otherwise normal. The pituitary and suprasellar region are normal. There is no mass lesion. There is no mass  effect or midline shift. Vascular: The major vessel flow voids at the skull base are preserved. Skull and upper cervical spine: Normal marrow signal is preserved. Sinuses/Orbits: Extensive chronic sinusitis is again noted. Bilateral lens implants are noted. The globes and orbits are otherwise unremarkable. Other: The mastoid air cells are clear. IMPRESSION: No evidence of global anoxic injury or other acute intracranial pathology. Electronically signed by: Maude Harry MD 04/27/2024 04:08 PM EST RP Workstation: [id number]   EEG adult Result Date: 04/26/2024 Shelton Arlin KIDD, MD     04/27/2024  4:52 PM Patient Name: Chase Clark MRN: [id number] Epilepsy Attending: Arlin KIDD Shelton Referring Physician/Provider: Isaiah Scrivener, MD Date: 04/27/2024 Duration: 40.23 mins Patient history: 59yo M s/p cardiac arrest. EEG to evaluate for seizure Level of alertness:  comatose AEDs during EEG study: Versed  Technical aspects: This EEG study was done with scalp electrodes positioned according to the 10-20 International system of electrode placement. Electrical activity was reviewed with band pass filter of 1-70Hz , sensitivity of 7 uV/mm, display speed of 95mm/sec with a 60Hz  notched filter applied as appropriate.  EEG data were recorded continuously and digitally stored.  Video monitoring was available and reviewed as appropriate. Of note, report was delayed because it was assigned to a different physician yesterday Description: Patient was noted to have episodes of brief sudden whole body jerking every few seconds. Concomitant EEG showed generalized polyspikes consistent with myoclonic seizures.  In between seizures EEG showed generalized background suppression.  Briefly myoclonic seizures abated.  EEG then showed near continuous generalized 3-5hz  theta-delta slowing admixed with generalized periodic discharges with triphasic morphology at 1 Hz.  Hyperventilation and photic stimulation were not performed.   ABNORMALITY -  Myoclonic seizures, generalized - Periodic discharges with triphasic morphology, Generalized - Continuous Slow, Generalized IMPRESSION: Patient was noted to have episodes of whole body jerking intermittently during the study consistent with myoclonic seizures.  Additionally study was suggestive of generalized cerebral dysfunction (encephalopathy).  In the setting of cardiac arrest, this EEG pattern is concerning for anoxic/hypoxic brain injury. Please consider long-term EEG if clinically indicated.  Dr. Isaiah was notified. Arlin MALVA Krebs   ECHOCARDIOGRAM COMPLETE Result Date: 04/26/2024    ECHOCARDIOGRAM REPORT   Patient Name:   WADIE DELENA LOUDER Date of Exam: 04/25/2024 Medical Rec #:  [id number]       Height:       70.0 in Accession #:    [id number]      Weight:       247.1 lb Date of Birth:  [birth year]       BSA:          2.284 m Patient Age:    58 years        BP:           128/77 mmHg Patient Gender: M               HR:           82 bpm. Exam Location:  [facility] Procedure: 2D Echo, Cardiac Doppler and Color Doppler (Both Spectral and Color            Flow Doppler were utilized during procedure). Indications:     I46.9 Cardiac Arrest  History:         Patient has prior history of Echocardiogram examinations. CAD                  and Previous Myocardial Infarction, Prior CABG; Risk                  Factors:Hypertension, Diabetes and Dyslipidemia. End Stage                  Renal Disease-Dialysis. Peripheral vascular disease.  Sonographer:     Carl Coma RDCS Referring Phys:  [id number] LONELL KANDICE MOOSE Diagnosing Phys: Dwayne D Callwood MD IMPRESSIONS  1. Left ventricular ejection fraction, by estimation, is 30 to 35%. The left ventricle has moderately decreased function. The left ventricle demonstrates regional wall motion abnormalities (see scoring diagram/findings for description). The left ventricular internal cavity size was mildly dilated. There is mild concentric left ventricular hypertrophy. Left  ventricular diastolic parameters are consistent with Grade I diastolic dysfunction (impaired relaxation).  2. Right ventricular systolic function is normal. The right ventricular size is mildly enlarged.  3. Left atrial size was moderately dilated.  4. Right atrial size was mildly dilated.  5. The mitral valve is grossly normal. Trivial mitral valve regurgitation.  6. The aortic valve is calcified. Aortic valve regurgitation is trivial. Aortic valve sclerosis/calcification is present, without any evidence of  aortic stenosis. FINDINGS  Left Ventricle: Left ventricular ejection fraction, by estimation, is 30 to 35%. The left ventricle has moderately decreased function. The left ventricle demonstrates regional wall motion abnormalities. Strain was performed and the global longitudinal strain is indeterminate. The left ventricular internal cavity size was mildly dilated. There is mild concentric left ventricular hypertrophy. Left ventricular diastolic parameters are consistent with Grade I diastolic dysfunction (impaired relaxation). Right Ventricle: The right ventricular size is mildly enlarged. No increase in right ventricular wall thickness. Right ventricular systolic function is normal. Left Atrium: Left atrial size was moderately dilated. Right Atrium: Right atrial size was mildly dilated. Pericardium: There is no evidence of pericardial effusion. Mitral Valve: The mitral valve is grossly normal. Trivial mitral valve regurgitation. Tricuspid Valve: The tricuspid valve is normal in structure. Tricuspid valve regurgitation is mild. Aortic Valve: The aortic valve is calcified. Aortic valve regurgitation is trivial. Aortic valve sclerosis/calcification is present, without any evidence of aortic stenosis. Pulmonic Valve: The pulmonic valve was not well visualized. Pulmonic valve regurgitation is not visualized. Aorta: The aortic root was not well visualized. IAS/Shunts: No atrial level shunt detected by color flow  Doppler. Additional Comments: 3D was performed not requiring image post processing on an independent workstation and was indeterminate.  LEFT VENTRICLE PLAX 2D LVIDd:         5.10 cm   Diastology LVIDs:         4.40 cm   LV e' medial:    4.79 cm/s LV PW:         1.40 cm   LV E/e' medial:  16.4 LV IVS:        1.20 cm   LV e' lateral:   4.51 cm/s LVOT diam:     2.00 cm   LV E/e' lateral: 17.4 LV SV:         38 LV SV Index:   17 LVOT Area:     3.14 cm  RIGHT VENTRICLE            IVC RV Basal diam:  3.90 cm    IVC diam: 1.60 cm RV S prime:     9.31 cm/s TAPSE (M-mode): 1.6 cm LEFT ATRIUM             Index        RIGHT ATRIUM           Index LA diam:        4.70 cm 2.06 cm/m   RA Area:     16.60 cm LA Vol (A2C):   47.7 ml 20.89 ml/m  RA Volume:   44.30 ml  19.40 ml/m LA Vol (A4C):   34.6 ml 15.15 ml/m LA Biplane Vol: 41.3 ml 18.09 ml/m  AORTIC VALVE LVOT Vmax:   75.45 cm/s LVOT Vmean:  46.150 cm/s LVOT VTI:    0.122 m  AORTA Ao Root diam: 3.70 cm Ao Asc diam:  3.60 cm MITRAL VALVE MV Area (PHT): 4.13 cm    SHUNTS MV Decel Time: 184 msec    Systemic VTI:  0.12 m MV E velocity: 78.60 cm/s  Systemic Diam: 2.00 cm MV A velocity: 93.55 cm/s MV E/A ratio:  0.84 Dwayne D Callwood MD Electronically signed by Cara JONETTA Lovelace MD Signature Date/Time: 04/26/2024/2:59:25 PM    Final    US  Venous Img Lower Bilateral (DVT) Result Date: 04/26/2024 EXAM: ULTRASOUND DUPLEX OF THE BILATERAL LOWER EXTREMITY VEINS TECHNIQUE: Duplex ultrasound using B-mode/gray scaled imaging and Doppler spectral analysis and color flow  was obtained of the deep venous structures of the bilateral lower extremity. COMPARISON: None available. CLINICAL HISTORY: 201733 DVT (deep venous thrombosis) (HCC) 798266 DVT (deep venous thrombosis) (HCC) FINDINGS: The common femoral vein, femoral vein, popliteal vein, and posterior tibial vein demonstrate normal compressibility with normal color flow and spectral analysis bilaterally . IMPRESSION: 1. No evidence  of DVT bilaterally . Electronically signed by: Oneil Devonshire MD MD 04/26/2024 02:32 AM EST RP Workstation: [id number]   DG Chest Port 1 View Result Date: 04/25/2024 CLINICAL DATA:  Intubated EXAM: PORTABLE CHEST 1 VIEW COMPARISON:  04/25/2024 FINDINGS: Single frontal view of the chest demonstrates endotracheal tube overlying tracheal air column, tip approximately 10 cm above the carina. Enteric catheter passes below diaphragm, tip excluded by collimation. Right internal jugular dialysis catheter unchanged. Stable cardiac silhouette. No airspace disease, effusion, or pneumothorax. No acute bony abnormalities. IMPRESSION: 1. Retraction of endotracheal tube since prior study, with tip now approximately 10 cm above carina. 2. No acute airspace disease. Electronically Signed   By: Ozell Daring M.D.   On: 04/25/2024 23:23   DG Chest Port 1 View Result Date: 04/25/2024 EXAM: 1 VIEW(S) XRAY OF THE CHEST 04/25/2024 07:06:16 PM COMPARISON: 04/25/2024 CLINICAL HISTORY: Endotracheal tube present FINDINGS: LINES, TUBES AND DEVICES: Endotracheal tube in place with tip at midthoracic trachea, 5.9 cm above the carina. Gastric tube in place, courses below diaphragm with tip outside field of view. Right IJ CVC in place with tip at [facility]. Defibrillator pads noted overlying chest. LUNGS AND PLEURA: Low lung volumes. No focal pulmonary opacity. No pleural effusion. No pneumothorax. HEART AND MEDIASTINUM: No acute abnormality of the cardiac and mediastinal silhouettes. BONES AND SOFT TISSUES: Prior sternotomy and CABG noted. No acute osseous abnormality. IMPRESSION: 1. Endotracheal tube, gastric tube, and right IJ CVC in appropriate position. 2. No acute cardiopulmonary abnormality. Pulmonary hypoinflation 3. Prior sternotomy and CABG. Electronically signed by: Dorethia Molt MD MD 04/25/2024 08:13 PM EST RP Workstation: [id number]   CT CHEST ABDOMEN PELVIS W CONTRAST Result Date: 04/25/2024 EXAM: CT CHEST, ABDOMEN AND PELVIS WITH  CONTRAST 04/25/2024 05:35:00 PM TECHNIQUE: CT of the chest, abdomen and pelvis was performed with the administration of 100 mL of iohexol  (OMNIPAQUE ) 300 MG/ML solution. Multiplanar reformatted images are provided for review. Automated exposure control, iterative reconstruction, and/or weight based adjustment of the mA/kV was utilized to reduce the radiation dose to as low as reasonably achievable. COMPARISON: 08/28/2023 CLINICAL HISTORY: Resuscitated arrest. FINDINGS: CHEST: MEDIASTINUM AND LYMPH NODES: Low lying endotracheal tube positioned at the ostium of the right mainstem bronchus. 3 cm off retraction recommended. Borderline cardiomegaly. Dense multivessel coronary atherosclerosis with changes of a prior CABG procedure. Sternotomy wires. Tunneled right IJ approach hemodialysis catheter in place terminating in the high right atrium. The central airways are clear. No mediastinal, hilar or axillary lymphadenopathy. LUNGS AND PLEURA: Patchy dependent airspace opacities in both lungs with a small amount of central ground glass airspace opacity in the left upper lobe. Areas of dependent nodularity noted in the left lower lobe. Trace right pleural effusion. No pneumothorax. ABDOMEN AND PELVIS: LIVER: Cholecystectomy. GALLBLADDER AND BILE DUCTS: Unremarkable. No biliary ductal dilatation. SPLEEN: No acute abnormality. PANCREAS: No acute abnormality. ADRENAL GLANDS: No acute abnormality. KIDNEYS, URETERS AND BLADDER: Urinary bladder is completely decompressed with a urinary catheter in place. No stones in the kidneys or ureters. No hydronephrosis. No perinephric or periureteral stranding. GI AND BOWEL: Esophagogastric tube is well positioned terminating in the stomach. Scattered sigmoid diverticulosis. Normal appendix. There is no  bowel obstruction. REPRODUCTIVE ORGANS: No acute abnormality. PERITONEUM AND RETROPERITONEUM: Well positioned peritoneal dialysis catheter terminating in the pelvis. Small volume free fluid  in the abdomen, likely related to the peritoneal dialysate. No free air. VASCULATURE: Diffuse aortoiliac atherosclerosis. Aorta is normal in caliber. ABDOMINAL AND PELVIS LYMPH NODES: No lymphadenopathy. BONES AND SOFT TISSUES: Mildly displaced fractures of the right anterior 2nd and 5th ribs. Nondisplaced buckle type fractures of the anterior right 3rd, 4th, and 6th ribs. Nondisplaced buckle type fracture of the anterior left 3rd rib with a mildly displaced anterior left 4th rib fracture. Anterior buckle fractures of the anterior left 5th and 6th ribs. Moderate volume symmetric bilateral gynecomastia. Small fat containing bilateral inguinal hernias. IMPRESSION: 1. Low lying endotracheal tube positioned at the ostium of the right mainstem bronchus. 3 cm of retraction is recommended. 2. Patchy dependent airspace opacities in both lungs with a small amount of central ground glass airspace opacity in the left upper lobe and areas of dependent nodularity in the left lower lobe. This may reflect changes of aspiration or a developing bronchopneumonia. 3. Multiple acute rib fractures bilaterally, as described above. Trace right pleural effusion. No pneumothorax. 4. Well-positioned esophagogastric and right IJ approach hemodialysis catheters. 5. Small volume ascites in the abdomen, likely related to peritoneal dialysate. 6. Scattered sigmoid diverticulosis. Electronically signed by: Rogelia Myers MD MD 04/25/2024 06:51 PM EST RP Workstation: [id number]   CT Head Wo Contrast Result Date: 04/25/2024 CLINICAL DATA:  Witnessed cardiac arrest status post resuscitation EXAM: CT HEAD WITHOUT CONTRAST TECHNIQUE: Contiguous axial images were obtained from the base of the skull through the vertex without intravenous contrast. RADIATION DOSE REDUCTION: This exam was performed according to the departmental dose-optimization program which includes automated exposure control, adjustment of the mA and/or kV according to patient size  and/or use of iterative reconstruction technique. COMPARISON:  07/26/2023 FINDINGS: Brain: No acute infarct or hemorrhage. Stable chronic small vessel ischemic changes within the left thalamus. Lateral ventricles and midline structures are unremarkable. No acute extra-axial fluid collections. No mass effect. Vascular: No hyperdense vessel or unexpected calcification. Skull: Normal. Negative for fracture or focal lesion. Sinuses/Orbits: Opacification of the right frontal, bilateral ethmoid, and sphenoid sinus, which has progressed since prior study. Other: None. IMPRESSION: 1. No acute intracranial process. 2. Progressive chronic sinus disease. Electronically Signed   By: Ozell Daring M.D.   On: 04/25/2024 18:34   DG Abdomen 1 View Result Date: 04/25/2024 CLINICAL DATA:  Enteric catheter placement EXAM: ABDOMEN - 1 VIEW COMPARISON:  04/29/2023 FINDINGS: Frontal view of the lower chest and upper abdomen demonstrates enteric catheter passing below diaphragm, tip and side port projecting over the gastric fundus. Bowel gas pattern is unremarkable. IMPRESSION: 1. Enteric catheter tip projecting over the gastric fundus. Electronically Signed   By: Ozell Daring M.D.   On: 04/25/2024 15:17   DG Chest 1 View Result Date: 04/25/2024 CLINICAL DATA:  Intubated EXAM: CHEST  1 VIEW COMPARISON:  08/27/2023 FINDINGS: Single frontal view of the chest demonstrates endotracheal tube overlying tracheal air column, tip approximately 4 cm above carina. Enteric catheter passes below diaphragm, tip excluded by collimation. Right internal jugular dialysis catheter tip overlies superior vena cava. 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 Cardiac silhouette is unremarkable. No acute airspace disease, effusion, or pneumothorax. IMPRESSION: 1. Support devices as above. 2. No acute airspace disease. Electronically Signed   By: Ozell Daring M.D.   On: 04/25/2024 15:16   PERIPHERAL VASCULAR CATHETERIZATION Result Date:  04/21/2024 See surgical  note for result.    PHYSICAL EXAM  Temp:  [97.7 F (36.5 C)-100 F (37.8 C)] 99.3 F (37.4 C) May 02, 2024 1300) Pulse Rate:  [64-84] 81 May 02, 2024 1300) Resp:  [8-27] 27 05-02-24 1300) BP: (112-156)/(50-81) 135/68 05/02/24 1300) SpO2:  [92 %-99 %] 97 % 05-02-24 1300) FiO2 (%):  [30 %-35 %] 30 % May 02, 2024 1100) Weight:  [113.8 kg] 113.8 kg 05/02/2024 0500)  General - Well nourished, well developed, intubated off sedation.   Ophthalmologic - fundi not visualized due to noncooperation.   Cardiovascular - Regular rate and rhythm.   Neuro - intubated off sedation, eyes closed, not following commands. With forced eye opening, eyes in mid position, not blinking to visual threat, not tracking, legally blink bilaterally at baseline, pupils irregular shape with previous surgeries and not reactive to light. Corneal reflex absent, gag and cough present. Breathing over the vent on SBT mode.  Facial symmetry not able to test due to ET tube.  Tongue protrusion not cooperative. On pain stimulation, no movement of all extremities. Sensation, coordination and gait not tested.     Assessment: 59 y.o. male PMH of ESRD on dialysis, CAD status post CABG in 2010, CHF, hypertension, hyperlipidemia, diabetes, PAD status post left BKA, bilateral legally blind admitted postcardiac arrest on 1/12. Intubated and admitted to ICU.  Cardiology consulted, on heparin  IV for non-STEMI. Patient was found to have myoclonic jerking movement, and EEG showed generalized myoclonic seizures.  Currently no clinical seizure, repeat EEG no seizure but generalized spikes.  MRI showed no significant anoxic brain injury.  Start Keppra  yesterday. Off sedation today but difficulty with breathing, given the poor prognosis, family would like comfort care measures.    Plan: - continue Keppra  1000mg  daily per ESRD dosing for comfort - Seizure precautions for comfort - will sign off, please call with questions.   Hospital day #  3    Ary Cummins, MD PhD Stroke Neurology May 02, 2024 2:32 PM    To contact Stroke Continuity provider, please refer to [email or web address]. After hours, contact General Neurology

## 2024-05-15 NOTE — Progress Notes (Signed)
 PHARMACY CONSULT NOTE - ELECTROLYTES  Pharmacy Consult for Electrolyte Monitoring and Replacement   Recent Labs: Height: 5' 10 (177.8 cm) Weight: 113.8 kg (250 lb 14.1 oz) IBW/kg (Calculated) : 73 Estimated Creatinine Clearance: 7.8 mL/min (A) (by C-G formula based on SCr of 12.8 mg/dL (H)). Potassium (mmol/L)  Date Value  May 20, 2024 4.7  12/29/2013 4.5   Magnesium  (mg/dL)  Date Value  98/84/7973 2.4   Calcium  (mg/dL)  Date Value  98/84/7973 8.6 (L)   Albumin  (g/dL)  Date Value  98/84/7973 2.6 (L)   Phosphorus (mg/dL)  Date Value  98/84/7973 7.6 (H)   Sodium (mmol/L)  Date Value  2024/05/20 129 (L)  02/14/2019 140   Assessment  Chase Clark is a 59 y.o. male presenting with cardiac arrest. PMH significant for ESRD on dialysis. Pharmacy has been consulted to monitor and replace electrolytes. Currently on CRRT with 4/2.5 bath.  Diet: NPO MIVF: none Pertinent medications: none  Goal of Therapy: Electrolytes WNL  Plan:  No electrolyte repletion indicated Check BMP, Mg with AM labs  Thank you for allowing pharmacy to be a part of this patient's care.  Belvie Macintosh, PharmD Candidate May 20, 2024 7:14 AM

## 2024-05-15 NOTE — Progress Notes (Signed)
 " [facility]  ROUNDING NOTE   Subjective:   Patient with past medical history of ESRD originally on peritoneal dialysis transitioning to HHD, fatty liver, gastroparesis, GERD, Gilbert's disease, severe diabetes mellitus type 2, peripheral neuropathy, coronary artery disease with history of CABG and stent placement, left below the knee amputation, legally blind, history of diabetic foot ulcer who was transitioning to HHD but unfortunately suffered cardiac arrest x 20 minutes on first treatment.  Update: Patient remains critically ill at the moment. MRI brain negative for global anoxic injury. Remains on vent support.  Objective:  Vital signs in last 24 hours:  Temp:  [97.7 F (36.5 C)-100.2 F (37.9 C)] 99.3 F (37.4 C) 05-08-24 0800) Pulse Rate:  [64-82] 82 2024/05/08 0800) Resp:  [8-26] 25 05-08-24 0800) BP: (112-149)/(50-68) 132/68 May 08, 2024 0700) SpO2:  [92 %-99 %] 96 % 05/08/2024 0800) FiO2 (%):  [30 %-35 %] 30 % 05/08/24 0109) Weight:  [113.8 kg] 113.8 kg 08-May-2024 0500)  Weight change: 3.7 kg Filed Weights   04/26/24 0415 04/27/24 0500 May 08, 2024 0500  Weight: 108.4 kg 110.1 kg 113.8 kg    Intake/Output: I/O last 3 completed shifts: In: 3189.5 [I.V.:1099; NG/GT:1870.5; IV Piggyback:220] Out: 500 [Urine:500]   Intake/Output this shift:  No intake/output data recorded.  Physical Exam: General: Critically ill-appearing  Head: Normocephalic, atraumatic.  Endotracheal tube in place  Neck: Supple  Lungs:  Scattered rhonchi  Heart: S1S2 no rubs  Abdomen:  Soft, nontender, bowel sounds present  Extremities: Left BKA  Neurologic: Intubated, not following commands  Skin: No acute rash  Access: R internal jugular PC     Basic Metabolic Panel: Recent Labs  Lab 04/25/24 1342 04/25/24 2055 04/26/24 0636 04/27/24 0507 04/27/24 1754 05/08/24 0342  NA 129*  --  126* 128* 128* 129*  K 4.6  --  4.1 4.2 4.9 4.7  CL 91*  --  87* 90* 88* 88*  CO2 16*  --  23 22 21* 20*   GLUCOSE 175*  --  191* 198* 277* 267*  BUN 49*  --  57* 73* 89* 98*  CREATININE 8.70*  --  9.51* 11.50* 12.30* 12.80*  CALCIUM  9.1  --  8.8* 8.6* 8.4* 8.6*  MG  --  1.7 1.8 2.4  --  2.4  PHOS  --  6.4* 6.7* 6.9* 7.5* 7.6*    Liver Function Tests: Recent Labs  Lab 04/25/24 1342 04/26/24 0636 04/27/24 1754 05/08/2024 0342  AST 290* 113*  --   --   ALT 163* 109*  --   --   ALKPHOS 245* 223*  --   --   BILITOT 0.5 0.3  --   --   PROT 6.8 6.5  --   --   ALBUMIN  2.8* 2.8* 2.8* 2.6*   No results for input(s): LIPASE, AMYLASE in the last 168 hours. No results for input(s): AMMONIA in the last 168 hours.  CBC: Recent Labs  Lab 04/25/24 1342 04/25/24 2304 04/26/24 0636 04/27/24 0507 05/08/24 0342  WBC 18.9* 15.1* 12.6* 12.9* 12.8*  HGB 10.6* 9.5* 9.6* 8.3* 7.9*  HCT 33.7* 28.0* 29.2* 25.7* 24.3*  MCV 112.3* 107.7* 107.4* 106.6* 107.5*  PLT 276 259 245 225 211    Cardiac Enzymes: No results for input(s): CKTOTAL, CKMB, CKMBINDEX, TROPONINI in the last 168 hours.  BNP: Invalid input(s): POCBNP  CBG: Recent Labs  Lab 04/27/24 0749 04/27/24 1649 04/27/24 1933 04/27/24 2312 May 08, 2024 0303  GLUCAP 191* 262* 248* 251* 266*    Microbiology: Results  for orders placed or performed during the hospital encounter of 04/25/24  Resp panel by RT-PCR (RSV, Flu A&B, Covid) Anterior Nasal Swab     Status: None   Collection Time: 04/25/24  3:24 PM   Specimen: Anterior Nasal Swab  Result Value Ref Range Status   SARS Coronavirus 2 by RT PCR NEGATIVE NEGATIVE Final    Comment: (NOTE) SARS-CoV-2 target nucleic acids are NOT DETECTED.  The SARS-CoV-2 RNA is generally detectable in upper respiratory specimens during the acute phase of infection. The lowest concentration of SARS-CoV-2 viral copies this assay can detect is 138 copies/mL. A negative result does not preclude SARS-Cov-2 infection and should not be used as the sole basis for treatment or other patient  management decisions. A negative result may occur with  improper specimen collection/handling, submission of specimen other than nasopharyngeal swab, presence of viral mutation(s) within the areas targeted by this assay, and inadequate number of viral copies(<138 copies/mL). A negative result must be combined with clinical observations, patient history, and epidemiological information. The expected result is Negative.  Fact Sheet for Patients:  bloggercourse.com  Fact Sheet for Healthcare Providers:  seriousbroker.it  This test is no t yet approved or cleared by the [place]  FDA and  has been authorized for detection and/or diagnosis of SARS-CoV-2 by FDA under an Emergency Use Authorization (EUA). This EUA will remain  in effect (meaning this test can be used) for the duration of the COVID-19 declaration under Section 564(b)(1) of the Act, 21 U.S.C.section 360bbb-3(b)(1), unless the authorization is terminated  or revoked sooner.       Influenza A by PCR NEGATIVE NEGATIVE Final   Influenza B by PCR NEGATIVE NEGATIVE Final    Comment: (NOTE) The Xpert Xpress SARS-CoV-2/FLU/RSV plus assay is intended as an aid in the diagnosis of influenza from Nasopharyngeal swab specimens and should not be used as a sole basis for treatment. Nasal washings and aspirates are unacceptable for Xpert Xpress SARS-CoV-2/FLU/RSV testing.  Fact Sheet for Patients: bloggercourse.com  Fact Sheet for Healthcare Providers: seriousbroker.it  This test is not yet approved or cleared by the [place]  FDA and has been authorized for detection and/or diagnosis of SARS-CoV-2 by FDA under an Emergency Use Authorization (EUA). This EUA will remain in effect (meaning this test can be used) for the duration of the COVID-19 declaration under Section 564(b)(1) of the Act, 21 U.S.C. section 360bbb-3(b)(1),  unless the authorization is terminated or revoked.     Resp Syncytial Virus by PCR NEGATIVE NEGATIVE Final    Comment: (NOTE) Fact Sheet for Patients: bloggercourse.com  Fact Sheet for Healthcare Providers: seriousbroker.it  This test is not yet approved or cleared by the [place]  FDA and has been authorized for detection and/or diagnosis of SARS-CoV-2 by FDA under an Emergency Use Authorization (EUA). This EUA will remain in effect (meaning this test can be used) for the duration of the COVID-19 declaration under Section 564(b)(1) of the Act, 21 U.S.C. section 360bbb-3(b)(1), unless the authorization is terminated or revoked.  Performed at [facility], [address] [place], [place] [address]   MRSA Next Gen by PCR, Nasal     Status: Abnormal   Collection Time: 04/25/24 10:19 PM   Specimen: Nasal Mucosa; Nasal Swab  Result Value Ref Range Status   MRSA by PCR Next Gen DETECTED (A) NOT DETECTED Final    Comment: RESULT CALLED TO, READ BACK BY AND VERIFIED WITH:  ABIGAIL SANTOS AT 0050 04/26/24 JG (NOTE) The GeneXpert MRSA  Assay (FDA approved for NASAL specimens only), is one component of a comprehensive MRSA colonization surveillance program. It is not intended to diagnose MRSA infection nor to guide or monitor treatment for MRSA infections. Test performance is not FDA approved in patients less than 25 years old. Performed at [facility], [address] [place], [place] [address]   Culture, blood (Routine X 2) w Reflex to ID Panel     Status: None (Preliminary result)   Collection Time: 04/25/24 11:04 PM   Specimen: BLOOD RIGHT HAND  Result Value Ref Range Status   Specimen Description   Final    BLOOD RIGHT HAND Performed at [facility], [address] [place], [place] [address]    Special Requests   Final    BOTTLES DRAWN AEROBIC AND ANAEROBIC Blood Culture adequate  volume Performed at [facility], [address], [place], [place] [address]    Culture   Final    NO GROWTH 1 DAY Performed at [facility], [address] [place], [place] [address]    Report Status PENDING  Incomplete  Body fluid culture w Gram Stain     Status: None (Preliminary result)   Collection Time: 04/25/24 11:19 PM   Specimen: Peritoneal Cavity; Body Fluid  Result Value Ref Range Status   Specimen Description   Final    PERITONEAL CAVITY Performed at [facility], [address] [place], [place] [address]    Special Requests   Final    NONE Performed at [facility], [address] [place], [place] [address]    Gram Stain   Final    RARE WBC PRESENT, PREDOMINANTLY PMN NO ORGANISMS SEEN    Culture   Final    NO GROWTH 1 DAY Performed at [facility], [address], [place], [place] [address]    Report Status PENDING  Incomplete  Culture, blood (Routine X 2) w Reflex to ID Panel     Status: None (Preliminary result)   Collection Time: 04/25/24 11:28 PM   Specimen: BLOOD RIGHT HAND  Result Value Ref Range Status   Specimen Description   Final    BLOOD RIGHT HAND Performed at [facility], [address], [place], [place] [address]    Special Requests   Final    BOTTLES DRAWN AEROBIC ONLY Blood Culture results may not be optimal due to an inadequate volume of blood received in culture bottles Performed at [facility], [address] [place], [place] [address]    Culture   Final    NO GROWTH 1 DAY Performed at [facility], [address], [place], [place] [address]    Report Status PENDING  Incomplete   *Note: Due to a large number of results and/or encounters for the requested time period, some results have not been displayed. A complete set of results can be found in Results Review.    Coagulation Studies: Recent Labs    04/25/24 05/30/2053  LABPROT 19.4*  INR 1.6*     Urinalysis: No results for input(s): COLORURINE, LABSPEC, PHURINE, GLUCOSEU, HGBUR, BILIRUBINUR, KETONESUR, PROTEINUR, UROBILINOGEN, NITRITE, LEUKOCYTESUR in the last 72 hours.  Invalid input(s): APPERANCEUR    Imaging: DG Chest Port 1 View Result Date: 04/27/2024 CLINICAL DATA:  Respiratory failure EXAM: PORTABLE CHEST 1 VIEW COMPARISON:  04/25/2024 FINDINGS: Single frontal view of the chest demonstrates endotracheal tube overlying tracheal air column, tip 3.5 cm above carina. Enteric catheter passes below  diaphragm tip excluded by collimation. Right internal jugular dialysis catheter tip overlies superior vena cava. Cardiac silhouette is enlarged but stable. Lung volumes are diminished, with continued crowding of the central pulmonary vasculature. No effusion or pneumothorax. No acute bony abnormalities. IMPRESSION: 1. Support devices as above. 2. No acute intrathoracic process. Electronically Signed   By: Ozell Daring M.D.   On: 04/27/2024 17:20   EEG adult Result Date: 04/27/2024 Shelton Arlin KIDD, MD     04/27/2024  5:21 PM Patient Name: Chase Clark MRN: [id number] Epilepsy Attending: Arlin KIDD Shelton Referring Physician/Provider: Jerri Pfeiffer, MD Date: 2024-05-18 Duration: 40.23 mins  Patient history: 59yo M s/p cardiac arrest. EEG to evaluate for seizure  Level of alertness:  comatose  AEDs during EEG study: Versed   Technical aspects: This EEG study was done with scalp electrodes positioned according to the 10-20 International system of electrode placement. Electrical activity was reviewed with band pass filter of 1-70Hz , sensitivity of 7 uV/mm, display speed of 1mm/sec with a 60Hz  notched filter applied as appropriate. EEG data were recorded continuously and digitally stored.  Video monitoring was available and reviewed as appropriate.  Description: EEG showed near continuous generalized 4-6hz  theta-delta slowing admixed with generalized spikes. Brief 1-3 seconds  of generalized attenuation was also noted. Hyperventilation and photic stimulation were not performed.    ABNORMALITY - Spikes, Generalized - Continuous slow, Generalized  IMPRESSION: This study showed evidence of epileptogenicity with generalized onset. Additionally there was generalized cerebral dysfunction (encephalopathy).  No definite seizures were noted  Arlin KIDD Shelton    MR BRAIN WO CONTRAST Result Date: 04/27/2024 HISTORY: Mental status change, unknown cause; cardiac arrest, possible anoxic injury EXAM: MR BRAIN WITHOUT CONTRAST TECHNIQUE: Multiplanar, multiecho pulse sequences of the brain and surrounding structures were obtained without intravenous contrast. COMPARISON:  CT head 04/25/2024, MR head 07/26/2023 FINDINGS: Brain: There is no evidence of acute intracranial hemorrhage, extra-axial fluid collection, or acute infarct. There is no evidence of global anoxic injury. Mild parenchymal volume loss is unchanged. The ventricles are stable in size. 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 Parenchymal signal is otherwise normal. The pituitary and suprasellar region are normal. There is no mass lesion. There is no mass effect or midline shift. Vascular: The major vessel flow voids at the skull base are preserved. Skull and upper cervical spine: Normal marrow signal is preserved. Sinuses/Orbits: Extensive chronic sinusitis is again noted. Bilateral lens implants are noted. The globes and orbits are otherwise unremarkable. Other: The mastoid air cells are clear. IMPRESSION: No evidence of global anoxic injury or other acute intracranial pathology. Electronically signed by: Maude Harry MD 04/27/2024 04:08 PM EST RP Workstation: [id number]   EEG adult Result Date: 04/26/2024 Shelton Arlin KIDD, MD     04/27/2024  4:52 PM Patient Name: Chase Clark MRN: [id number] Epilepsy Attending: Arlin KIDD Shelton Referring Physician/Provider: Isaiah Scrivener, MD Date: 04/27/2024 Duration:  40.23 mins Patient history: 59yo M s/p cardiac arrest. EEG to evaluate for seizure Level of alertness:  comatose AEDs during EEG study: Versed  Technical aspects: This EEG study was done with scalp electrodes positioned according to the 10-20 International system of electrode placement. Electrical activity was reviewed with band pass filter of 1-70Hz , sensitivity of 7 uV/mm, display speed of 65mm/sec with a 60Hz  notched filter applied as appropriate. EEG data were recorded continuously and digitally stored.  Video monitoring was available and reviewed as appropriate. Of note, report was delayed because it was assigned to a different  physician yesterday Description: Patient was noted to have episodes of brief sudden whole body jerking every few seconds. Concomitant EEG showed generalized polyspikes consistent with myoclonic seizures.  In between seizures EEG showed generalized background suppression.  Briefly myoclonic seizures abated.  EEG then showed near continuous generalized 3-5hz  theta-delta slowing admixed with generalized periodic discharges with triphasic morphology at 1 Hz.  Hyperventilation and photic stimulation were not performed.   ABNORMALITY - Myoclonic seizures, generalized - Periodic discharges with triphasic morphology, Generalized - Continuous Slow, Generalized IMPRESSION: Patient was noted to have episodes of whole body jerking intermittently during the study consistent with myoclonic seizures.  Additionally study was suggestive of generalized cerebral dysfunction (encephalopathy).  In the setting of cardiac arrest, this EEG pattern is concerning for anoxic/hypoxic brain injury. Please consider long-term EEG if clinically indicated.  Dr. Isaiah was notified. Priyanka O Yadav     Medications:    cefTRIAXone  (ROCEPHIN )  IV Stopped (04/27/24 1917)   feeding supplement (PIVOT 1.5 CAL) 45 mL/hr at 04-May-2024 0700   heparin  2,000 Units/hr (May 04, 2024 0700)   midazolam  Stopped (May 04, 2024 0604)    norepinephrine  (LEVOPHED ) Adult infusion Stopped (04/27/24 1347)   prismasol  BGK 4/2.5     prismasol  BGK 4/2.5     prismasol  BGK 4/2.5      artificial tears  1 drop Both Eyes BID   aspirin   81 mg Per Tube Daily   brimonidine   1 drop Both Eyes BID   Chlorhexidine  Gluconate Cloth  6 each Topical Daily   cholecalciferol   2,000 Units Per Tube Daily   dorzolamide   1 drop Both Eyes BID   famotidine   10 mg Per Tube Daily   feeding supplement (PROSource TF20)  60 mL Per Tube Daily   free water   30 mL Per Tube Q4H   hydrALAZINE   10 mg Oral TID   insulin  aspart  0-9 Units Subcutaneous Q4H   isosorbide  dinitrate  5 mg Oral TID   latanoprost   1 drop Both Eyes q AM   levETIRAcetam   1,000 mg Intravenous Q24H   multivitamin  1 tablet Per Tube QHS   mupirocin  ointment  1 Application Nasal BID   nitroGLYCERIN   1 inch Topical Q8H   mouth rinse  15 mL Mouth Rinse Q2H   polyethylene glycol  17 g Per Tube Daily   senna  1 tablet Per Tube BID   thiamine   100 mg Per Tube Daily   timolol   1 drop Both Eyes BID   acetaminophen , fentaNYL  (SUBLIMAZE ) injection, fentaNYL  (SUBLIMAZE ) injection, heparin , midazolam , midazolam  PF, mouth rinse, polyethylene glycol, senna  Assessment/ Plan:  59 y.o. male with past medical history of ESRD originally on peritoneal dialysis transitioning to HHD, fatty liver, gastroparesis, GERD, Gilbert's disease, severe diabetes mellitus type 2, peripheral neuropathy, coronary artery disease with history of CABG and stent placement, left below the knee amputation, legally blind, history of diabetic foot ulcer who was transitioning to HHD but unfortunately suffered cardiac arrest x 20 minutes on first treatment.  1.  ESRD transitioning to HHD.  Patient apparently with failed PD treatments.  He was transitioning to HHD however on first HHD treatment as outpatient he suffered cardiac arrest x 20 minutes.   Update: Patient having additional family to arrive today.  They will be discussing  his neurologic status at the moment.  Based upon this discussion we will proceed with CRRT and nursing to contact us  once the discussion has occurred.  2.  Acute respiratory failure/PEA arrest.  As above patient  suffered cardiac arrest during first outpatient HHD treatment at the dialysis center.  Patient maintained on vent support at this time.  3.  Hypotension.  Norepinephrine  weaned off.  4.  Anemia of chronic kidney disease.  Hemoglobin down a bit further to 7.9.  Likely dilutional.  Monitor CBC.   LOS: 3 Rion Schnitzer 02-07-20268:02 AM   "

## 2024-05-15 NOTE — Progress Notes (Addendum)
 0800 Patient has had no sedation since 6 am. Cough and gag present. Patient is blind -no corneal reflex noted bilaterally. Does not withdraw to pain. Does not open eyes when asked. No spontaneous movement noted.Patient placed in spontaneous mode on the ventilator. No change in respiratory pattern. 0900 No change since 0800. Family in and discussed CRRT. Dr Marcelino in to see patient. 0930 Discussed code status and what patient would want. Still no purposeful movements or withdraws to pain. Questionable seizure activity. 1200 No change in patient status. Remains on spontaneous mode on the ventilator. Still does not react to painful stimuli. Brief questionable seizure activity lasting only a few seconds while turning patient. MD aware. 1430 Dr. Aleskerov in to talk with family about prognosis and plan of care. Family receptive to comfort care. 18 Family called in other family and friends. Comfort care orders.  1455 Morphine  drip started as ordered 1500 Given Robinol for excessive oral secretions. Given Ativan  and Versed  for air hunger. for air hunger. 1530 After all that wished to see patient had arrived-RT extubated patient. 1630 Patient died with family in attendance. Patient medicated as ordered while dying -see MAR

## 2024-05-15 NOTE — Plan of Care (Signed)
" °  Problem: Fluid Volume: Goal: Ability to maintain a balanced intake and output will improve Outcome: Not Progressing   Problem: Nutritional: Goal: Maintenance of adequate nutrition will improve Outcome: Progressing   Problem: Skin Integrity: Goal: Risk for impaired skin integrity will decrease Outcome: Progressing   "

## 2024-05-15 NOTE — IPAL (Signed)
 GOALS OF CARE FAMILY CONFERENCE   Current clinical status, hospital findings and medical plan was reviewed with family.   Updated and notified of patients ongoing immediate critical medical problems.   Advanced CHD and CAD s/p CABG with ESRD and cardiac arrest.  He has a long list of PMH with blidness, gilberts disease, complicated diabetic ulcers.  Patient remains unresponsive acutely comatose    Patient is unable to breathe independently, unable to protect airway and unable to mobilize secretions.    Explained to family course of therapy and the modalities   Patient with Progressive multiorgan failure with high probability of a very minimal chance of meaningful recovery despite aggressive and optimal medical therapy.   Family is appreciative of care and relate understanding that patient is severely critically ill with anticipation of passing away during this hospitalization.   They have consented and agreed to DNR/DNI comfort care Code status   Family are satisfied with Plan of action and management. All questions answered  Additional Critical Care time 35 mins    Halina Picking, M.D.  Pulmonary & Critical Care Medicine  [facility]

## 2024-05-15 NOTE — Progress Notes (Signed)
 Pt extubated, Per order,  to comfort care.

## 2024-05-15 NOTE — Death Summary Note (Signed)
" ° °  Death Summary   Chase Clark FMW:[id number] DOB: [birth year] DOA: 04-26-24  PCP: Patient, No Pcp Per  Admit date: 26-Apr-2024 Date of Death: 2024/04/29  Time of death : May 03, 1628  Final Diagnoses:  Principal Problem:   Cardiac arrest [facility]) Active Problems:   Encephalopathy   NSTEMI (non-ST elevated myocardial infarction) (HCC)   Ischemic cardiomyopathy    History of present illness:  This is a 59 yo male who presented to [facility] ER on 2024/04/26 from hemodialysis following a cardiac arrest.  Per ER notes pt cardiac arrested 45 minutes following initiation of first outpatient  hemodialysis session.  Per ED notes pt was in asystole and EMS administered a dose of bicarb and calcium  gluconate prior to ROSC.  Igel inserted by EMS.    ED Course  Upon arrival to the ER pt appeared to be spontaneously breathing with Igel in place.  Igel exchanged for ETT by EDP.  Significant lab results were Na+ 129/chloride 91/CO2 16/glucose 175/BUN 49/creatinine 8.70/anion gap 22/AST 290/ALT 163/troponin 4,922/lactic acid 8.1/wbc 18.9/hgb 10.6.  CXR and COVID/RSV/Influenza A&B negative.  CT Chest/Abd/Pelvis and CT Head results pending.  Pt requiring levophed  gtt to maintain map >65.  Pt admitted to ICU per PCCM team for additional workup and treatment.     2024/04/29- patient remains critically ill.  GOC conversation regarding RRT with wife and daughters.  He is on SBT 30%.  He is on NGF. CT chest with multifocal bilateral infiltrates.  Poor prognosis long term due to severe comorbid history.   Hospital Course:  Family at bedside reviewed his medical issues and after lengthy goals of care discussion we agreed to transition to comfort measures.     Signed:    Amran Malter, M.D.  Pulmonary & Critical Care Medicine  [facility]       "

## 2024-05-15 NOTE — Progress Notes (Signed)
 PHARMACY - ANTICOAGULATION CONSULT NOTE  Pharmacy Consult for UFH infusion Indication: chest pain/ACS  Allergies[1]  Patient Measurements: Height: 5' 10 (177.8 cm) Weight: 113.8 kg (250 lb 14.1 oz) IBW/kg (Calculated) : 73 HEPARIN  DW (KG): 96.4  Vital Signs: Temp: 99 F (37.2 C) 05/11/2024 0600) Temp Source: Esophageal (01/14 2000) BP: 132/57 (05-11-24 0600) Pulse Rate: 67 May 11, 2024 0600)  Labs: Recent Labs    04/25/24 2055 04/25/24 2304 04/26/24 0636 04/26/24 2021 04/27/24 0507 04/27/24 1754 2024-05-11 0342  HGB  --    < > 9.6*  --  8.3*  --  7.9*  HCT  --    < > 29.2*  --  25.7*  --  24.3*  PLT  --    < > 245  --  225  --  211  APTT 26  --   --   --   --   --   --   LABPROT 19.4*  --   --   --   --   --   --   INR 1.6*  --   --   --   --   --   --   HEPARINUNFRC  --   --  <0.10*   < > 0.30 0.38 0.29*  CREATININE  --   --  9.51*  --  11.50* 12.30* 12.80*   < > = values in this interval not displayed.    Estimated Creatinine Clearance: 7.8 mL/min (A) (by C-G formula based on SCr of 12.8 mg/dL (H)).   Medical History: Past Medical History:  Diagnosis Date   Anemia    Arthritis    Asthma    as child   Atypical mole 09/22/2022   R spinal mid back, needs excision   Barrett's esophagus 02/16/2014   short segment   Cataract    Coronary artery disease    DDD (degenerative disc disease), cervical    Diabetes mellitus without complication (HCC)    Diabetic osteomyelitis (HCC)    Diabetic peripheral neuropathy (HCC)    Diabetic ulcer of foot with muscle involvement without evidence of necrosis (HCC)    DIABETIC ULCERATIONS ASSOCIATED WITH IRRITATION LATERAL ANKLE LEFT GREATER THAN RIGHT WITH MILD CELLULITIS    Diverticulosis    DKA (diabetic ketoacidoses) 08/23/2017   Edema, lower extremity    ESRD on hemodialysis (HCC)    Fatty liver    Gastroparesis 2015   GERD (gastroesophageal reflux disease)    Gilbert's disease    Glaucoma    Hx of BKA, left (HCC)    Hx of  CABG 01/27/2019   2 vessels; LIMA-LAD, SVG-D1   Hx of heart artery stent 11/20/2009   80% D1 stenosis - 2.5 x 18 mm Xience V Everolimus (DES) stent x 1 placed   Hyperlipidemia    Hypertension    Left below-knee amputee (HCC)    Legally blind    Myocardial infarction (HCC) 11/2009   Neuropathy    Osteoarthritis of spine    Osteomyelitis of left foot (HCC)    Peritoneal dialysis catheter in place    Does dialysis over night.   PONV (postoperative nausea and vomiting)    PVD (peripheral vascular disease)    Seasonal allergies    Shoulder pain    Tubular adenoma of colon    Ulcer of foot (HCC) 11/17/2012   DIABETIC ULCERATIONS ASSOCIATED WITH IRRITATION LATERAL ANKLE LEFT GREATER THAN RIGHT WITH MILD CELLULITIS   Assessment: 59 y/o M presenting to the ED from  HD with cardiac arrest. Pharmacy consulted to initiate heparin  infusion for ACS.   Date Time Results Comments 0113 0636 HL < 0.1 subthera; 1250 un/hr 0113 2021 HL 0.26 Subtherapeutic, 1600u/h 0114 0507 HL 0.30 Therapeutic x1 0114 ~ 1800    HL 0.38 Therapeutic x 2 0115 0342 HL 0.29 Subtherapeutic  Goal of Therapy:  Heparin  level 0.3-0.7 units/ml Monitor platelets by anticoagulation protocol: Yes   Plan:  Heparin  level is subtherapeutic, CBC okay Give heparin  bolus of 1400 units x1 Increase heparin  infusion rate to 2000 units/hour Check heparin  level 6 hours after rate change Daily HL/CBC per protocol while on IV heparin   Thank you for involving pharmacy in this patient's care.   Damien Napoleon, PharmD Clinical Pharmacist May 24, 2024 6:36 AM    [1]  Allergies Allergen Reactions   Baclofen Other (See Comments)    Disorientation, Contraindication with dialysis    Nsaids Other (See Comments)    CKD on dialysis

## 2024-05-15 NOTE — Progress Notes (Signed)
 "  NAME:  Chase Clark, MRN:  [id number], DOB:  Sep 02, 1965, LOS: 3 ADMISSION DATE:  04/25/2024, CONSULTATION DATE: 04/25/2024 REFERRING MD: Dr. Fernand,   CHIEF COMPLAINT: Cardiac Arrest   History of Present Illness:  This is a 59 yo male who presented to [facility] ER on 01/12 from hemodialysis following a cardiac arrest.  Per ER notes pt cardiac arrested 45 minutes following initiation of first outpatient  hemodialysis session.  Per ED notes pt was in asystole and EMS administered a dose of bicarb and calcium  gluconate prior to ROSC.  Igel inserted by EMS.   ED Course  Upon arrival to the ER pt appeared to be spontaneously breathing with Igel in place.  Igel exchanged for ETT by EDP.  Significant lab results were Na+ 129/chloride 91/CO2 16/glucose 175/BUN 49/creatinine 8.70/anion gap 22/AST 290/ALT 163/troponin 4,922/lactic acid 8.1/wbc 18.9/hgb 10.6.  CXR and COVID/RSV/Influenza A&B negative.  CT Chest/Abd/Pelvis and CT Head results pending.  Pt requiring levophed  gtt to maintain map >65.  Pt admitted to ICU per PCCM team for additional workup and treatment.    2024/05/16- patient remains critically ill.  GOC conversation regarding RRT with wife and daughters.  He is on SBT 30%.  He is on NGF. CT chest with multifocal bilateral infiltrates.  Poor prognosis long term due to severe comorbid history.   Pertinent  Medical History   Past Medical History:  Diagnosis Date   Anemia    Arthritis    Asthma    as child   Atypical mole 09/22/2022   R spinal mid back, needs excision   Barrett's esophagus 02/16/2014   short segment   Cataract    Coronary artery disease    DDD (degenerative disc disease), cervical    Diabetes mellitus without complication (HCC)    Diabetic osteomyelitis (HCC)    Diabetic peripheral neuropathy (HCC)    Diabetic ulcer of foot with muscle involvement without evidence of necrosis (HCC)    DIABETIC ULCERATIONS ASSOCIATED WITH IRRITATION LATERAL ANKLE LEFT GREATER THAN RIGHT  WITH MILD CELLULITIS    Diverticulosis    DKA (diabetic ketoacidoses) 08/23/2017   Edema, lower extremity    ESRD on hemodialysis (HCC)    Fatty liver    Gastroparesis 2015   GERD (gastroesophageal reflux disease)    Gilbert's disease    Glaucoma    Hx of BKA, left (HCC)    Hx of CABG 01/27/2019   2 vessels; LIMA-LAD, SVG-D1   Hx of heart artery stent 11/20/2009   80% D1 stenosis - 2.5 x 18 mm Xience V Everolimus (DES) stent x 1 placed   Hyperlipidemia    Hypertension    Left below-knee amputee (HCC)    Legally blind    Myocardial infarction (HCC) 11/2009   Neuropathy    Osteoarthritis of spine    Osteomyelitis of left foot (HCC)    Peritoneal dialysis catheter in place    Does dialysis over night.   PONV (postoperative nausea and vomiting)    PVD (peripheral vascular disease)    Seasonal allergies    Shoulder pain    Tubular adenoma of colon    Ulcer of foot (HCC) 11/17/2012   DIABETIC ULCERATIONS ASSOCIATED WITH IRRITATION LATERAL ANKLE LEFT GREATER THAN RIGHT WITH MILD CELLULITIS   Micro Data:    COVID/RSV/Flu A&B 01/12>>negative  Blood x2 01/12>> MRSA PCR 01/12>> Tracheal aspirate 01/12>> Blood x2 01/12>>   Anti-infectives (From admission, onward)    Start     Dose/Rate Route Frequency  Ordered Stop   04/25/24 1900  cefTRIAXone  (ROCEPHIN ) 2 g in sodium chloride  0.9 % 100 mL IVPB        2 g 200 mL/hr over 30 Minutes Intravenous Every 24 hours 04/25/24 1847        Significant Hospital Events: Including procedures, antibiotic start and stop dates in addition to other pertinent events   01/12: Admitted to ICU post cardiac arrest during initial outpatient hemodialysis session requiring mechanical intubation  1/13 remains on  vent, +myoclonus, started KEPPRA  1/14 remains critically ill, versed  infusion started  Interim History / Subjective:  Remains critically ill Remains intubated Requires VENT support for survival EEG  pending SIGNS OF BRAIN DAMAGE  Vent  Mode: PSV;CPAP FiO2 (%):  [30 %-35 %] 30 % Set Rate:  [20 bmp] 20 bmp Vt Set:  [500 mL] 500 mL PEEP:  [5 cmH20] 5 cmH20 Pressure Support:  [5 cmH20] 5 cmH20   Objective    Blood pressure (!) 148/69, pulse 84, temperature 99.3 F (37.4 C), resp. rate (!) 24, height 5' 10 (1.778 m), weight 113.8 kg, SpO2 96%.    Vent Mode: PSV;CPAP FiO2 (%):  [30 %-35 %] 30 % Set Rate:  [20 bmp] 20 bmp Vt Set:  [500 mL] 500 mL PEEP:  [5 cmH20] 5 cmH20 Pressure Support:  [5 cmH20] 5 cmH20   Intake/Output Summary (Last 24 hours) at 05/14/2024 9061 Last data filed at 05/14/24 9196 Gross per 24 hour  Intake 1761.55 ml  Output 400 ml  Net 1361.55 ml   Filed Weights   04/26/24 0415 04/27/24 0500 2024-05-14 0500  Weight: 108.4 kg 110.1 kg 113.8 kg     REVIEW OF SYSTEMS  PATIENT IS UNABLE TO PROVIDE COMPLETE REVIEW OF SYSTEMS DUE TO SEVERE CRITICAL ILLNESS   PHYSICAL EXAMINATION:  GENERAL:critically ill appearing MOUTH: Moist mucosal membrane. INTUBATED PULMONARY: CTA b/l CARDIOVASCULAR:s1s2 GASTROINTESTINAL: Positive bowel sounds.  MUSCULOSKELETAL: no edema.  NEUROLOGIC: obtunded,sedated   Assessment and Plan   59 yo morbidly obese white male with ESRD on HD with acute cardiac arrest from NSTEMI with underlying CABG stressed from first session of HD, leading to prolonged downtime and physical exam and findings concerning for brain damage  Severe ACUTE Hypoxic and Hypercapnic Respiratory Failure -continue Mechanical Ventilator support -Wean Fio2 and PEEP as tolerated -VAP/VENT bundle implementation - Wean PEEP & FiO2 as tolerated, maintain SpO2 > 88% - Head of bed elevated 30 degrees, VAP protocol in place - Plateau pressures less than 30 cm H20  - Intermittent chest x-ray & ABG PRN - Ensure adequate pulmonary hygiene   NEUROLOGY ACUTE METABOLIC ENCEPHALOPATHY-concerns for anoxic brain damage MRI BRAIN PENDING CT Head 01/12: No acute intracranial process. Progressive chronic  sinus disease. - Correct metabolic derangements  - Avoid sedating medications as able  - Maintain RASS goal 0 to -1 - PAD protocol to maintain RASS goal: propofol  gtt  - WUA daily  - Promote family presence at bedside   EEG Results pending Continue Keppra   Cardiac arrest (asystole) suspect secondary to hemodialysis  Cardiogenic shock  Elevated troponin suspect NSTEMI  Severe lactic acidosis  Pressors as needed AC with heparin  ECHO Hx: CAD, HLD, heart murmur, MI, and PVD  - Continuous telemetry monitoring  - Trend troponin's until peaked  - Trend lactic acid until normalized  - Prn levophed  gtt to maintain map >65 - Heparin  gtt  - Echo EF 30%    RENAL/ESRD ON HD -continue Foley Catheter-assess need -Avoid nephrotoxic agents -Follow urine output, BMP -  Ensure adequate renal perfusion, optimize oxygenation -Renal dose medications  High risk for cardiac arrest with HD plan for CRRT in next 24 hrs as per nephrology, plan to start CRRT after MRI brain  Intake/Output Summary (Last 24 hours) at 05/21/24 0938 Last data filed at May 21, 2024 0803 Gross per 24 hour  Intake 1761.55 ml  Output 400 ml  Net 1361.55 ml    Transaminitis  - Trend hepatic function panel  - Avoid hepatotoxic agents as able   ACUTE ANEMIA- TRANSFUSE AS NEEDED CONSIDER TRANSFUSION  IF HGB<7 DVT PRX with TED/SCD's ONLY  ELECTROLYTES -follow labs as needed -replace as needed -pharmacy consultation and following    01/12: Pts family updated extensively by Dr. Isaiah regarding pts condition and current plan of care.  All questions answered  Labs   CBC: Recent Labs  Lab 04/25/24 1342 04/25/24 2304 04/26/24 0636 04/27/24 0507 May 21, 2024 0342  WBC 18.9* 15.1* 12.6* 12.9* 12.8*  HGB 10.6* 9.5* 9.6* 8.3* 7.9*  HCT 33.7* 28.0* 29.2* 25.7* 24.3*  MCV 112.3* 107.7* 107.4* 106.6* 107.5*  PLT 276 259 245 225 211    Basic Metabolic Panel: Recent Labs  Lab 04/25/24 1342 04/25/24 2055  04/26/24 0636 04/27/24 0507 04/27/24 1754 05/21/24 0342  NA 129*  --  126* 128* 128* 129*  K 4.6  --  4.1 4.2 4.9 4.7  CL 91*  --  87* 90* 88* 88*  CO2 16*  --  23 22 21* 20*  GLUCOSE 175*  --  191* 198* 277* 267*  BUN 49*  --  57* 73* 89* 98*  CREATININE 8.70*  --  9.51* 11.50* 12.30* 12.80*  CALCIUM  9.1  --  8.8* 8.6* 8.4* 8.6*  MG  --  1.7 1.8 2.4  --  2.4  PHOS  --  6.4* 6.7* 6.9* 7.5* 7.6*   GFR: Estimated Creatinine Clearance: 7.8 mL/min (A) (by C-G formula based on SCr of 12.8 mg/dL (H)). Recent Labs  Lab 04/25/24 1342 04/25/24 2055 04/25/24 2304 04/25/24 2328 04/26/24 0338 04/26/24 0636 04/27/24 0507 May 21, 2024 0342  WBC 18.9*  --  15.1*  --   --  12.6* 12.9* 12.8*  LATICACIDVEN 8.1* 5.2*  --  2.3* 1.8  --   --   --     Liver Function Tests: Recent Labs  Lab 04/25/24 1342 04/26/24 0636 04/27/24 1754 May 21, 2024 0342  AST 290* 113*  --   --   ALT 163* 109*  --   --   ALKPHOS 245* 223*  --   --   BILITOT 0.5 0.3  --   --   PROT 6.8 6.5  --   --   ALBUMIN  2.8* 2.8* 2.8* 2.6*   No results for input(s): LIPASE, AMYLASE in the last 168 hours. No results for input(s): AMMONIA in the last 168 hours.  ABG    Component Value Date/Time   PHART 7.32 (L) 04/25/2024 1430   PCO2ART 45 04/25/2024 1430   PO2ART 140 (H) 04/25/2024 1430   HCO3 23.2 04/25/2024 1430   TCO2 27 07/26/2023 1842   ACIDBASEDEF 3.1 (H) 04/25/2024 1430   O2SAT 99.7 04/25/2024 1430     Coagulation Profile: Recent Labs  Lab 04/25/24 2055  INR 1.6*    Cardiac Enzymes: No results for input(s): CKTOTAL, CKMB, CKMBINDEX, TROPONINI in the last 168 hours.  HbA1C: Hgb A1c MFr Bld  Date/Time Value Ref Range Status  04/25/2024 06:09 PM 6.2 (H) 4.8 - 5.6 % Final    Comment:    (  NOTE) Diagnosis of Diabetes The following HbA1c ranges recommended by the American Diabetes Association (ADA) may be used as an aid in the diagnosis of diabetes mellitus.  Hemoglobin              Suggested A1C NGSP%              Diagnosis  <5.7                   Non Diabetic  5.7-6.4                Pre-Diabetic  >6.4                   Diabetic  <7.0                   Glycemic control for                       adults with diabetes.    07/26/2023 08:25 PM 4.9 4.8 - 5.6 % Final    Comment:    (NOTE) Pre diabetes:          5.7%-6.4%  Diabetes:              >6.4%  Glycemic control for   <7.0% adults with diabetes     CBG: Recent Labs  Lab 04/27/24 1649 04/27/24 1933 04/27/24 2312 May 09, 2024 0303 2024/05/09 0829  GLUCAP 262* 248* 251* 266* 252*    Review of Systems:   Unable to assess pt mechanically intubated   Past Medical History:  He,  has a past medical history of Anemia, Arthritis, Asthma, Atypical mole (09/22/2022), Barrett's esophagus (02/16/2014), Cataract, Coronary artery disease, DDD (degenerative disc disease), cervical, Diabetes mellitus without complication (HCC), Diabetic osteomyelitis (HCC), Diabetic peripheral neuropathy (HCC), Diabetic ulcer of foot with muscle involvement without evidence of necrosis (HCC), Diverticulosis, DKA (diabetic ketoacidoses) (08/23/2017), Edema, lower extremity, ESRD on hemodialysis (HCC), Fatty liver, Gastroparesis (2015), GERD (gastroesophageal reflux disease), Gilbert's disease, Glaucoma, BKA, left (HCC), CABG (01/27/2019), heart artery stent (11/20/2009), Hyperlipidemia, Hypertension, Left below-knee amputee (HCC), Legally blind, Myocardial infarction (HCC) (11/2009), Neuropathy, Osteoarthritis of spine, Osteomyelitis of left foot (HCC), Peritoneal dialysis catheter in place, PONV (postoperative nausea and vomiting), PVD (peripheral vascular disease), Seasonal allergies, Shoulder pain, Tubular adenoma of colon, and Ulcer of foot (HCC) (11/17/2012).   Surgical History:   Past Surgical History:  Procedure Laterality Date   ABDOMINAL AORTOGRAM N/A 05/20/2016   Procedure: Abdominal Aortogram possible intervention;  Surgeon:  Cordella KANDICE Shawl, MD;  Location: [facility] IN[facility]ASIVE [facility];  Service: Cardiovascular;  Laterality: N/A;   AMPUTATION Left 09/05/2017   Procedure: AMPUTATION BELOW KNEE;  Surgeon: Dozier Soulier, MD;  Location: [facility] OR;  Service: Orthopedics;  Laterality: Left;   ANTERIOR CERVICAL DECOMP/DISCECTOMY FUSION N/A 05/07/2017   Procedure: ANTERIOR CERVICAL DECOMPRESSION/DISCECTOMY CORTEZ REDHEAD PROSTHESIS,PLATE/SCREWS CERVICAL FIVE - CERVICAL SIX;  Surgeon: Mavis Purchase, MD;  Location: [facility] OR;  Service: Neurosurgery;  Laterality: N/A;   CAPD INSERTION N/A 08/30/2020   Procedure: LAPAROSCOPIC INSERTION CONTINUOUS AMBULATORY PERITONEAL DIALYSIS  (CAPD) CATHETER;  Surgeon: Desiderio Schanz, MD;  Location: [facility] ORS;  Service: General;  Laterality: N/A;   CATARACT EXTRACTION W/ INTRAOCULAR LENS IMPLANT     CATARACT EXTRACTION W/PHACO Left 02/26/2017   Procedure: CATARACT EXTRACTION PHACO AND INTRAOCULAR LENS PLACEMENT (IOC);  Surgeon: Myrna Adine Anes, MD;  Location: [facility] ORS;  Service: Ophthalmology;  Laterality: Left;  Lot # [id number] H US : 00:25.3 AP%: 6.2 CDE: 1.59  CHOLECYSTECTOMY N/A    COLONOSCOPY WITH PROPOFOL  N/A 05/30/2021   Procedure: COLONOSCOPY WITH PROPOFOL ;  Surgeon: Albertus Gordy HERO, MD;  Location: [facility];  Service: Gastroenterology;  Laterality: N/A;   CORONARY ANGIOPLASTY WITH STENT PLACEMENT  11/20/2009   80% D1 stenosis - 2.5 x 18 mm Xience V Everolimus (DES) stent x 1 placed; LOCATION: [facility]; SURGEON: Vinie Jude, MD   CORONARY ARTERY BYPASS GRAFT N/A 01/27/2019   Procedure: OFF PUMP CORONARY ARTERY BYPASS GRAFTING (CABG) X 2 WITH ENDOSCOPIC HARVESTING OF RIGHT GREATER SAPHENOUS VEIN. LIMA TO LAD;  Surgeon: Shyrl Linnie KIDD, MD;  Location: [facility] OR;  Service: Open Heart Surgery;  Laterality: N/A;   CORONARY STENT INTERVENTION N/A 01/21/2019   Procedure: CORONARY STENT INTERVENTION;  Surgeon: Claudene Victory ORN, MD;  Location: [facility];  Service: Cardiovascular;  Laterality:  N/A;   DIALYSIS/PERMA CATHETER INSERTION N/A 04/21/2024   Procedure: DIALYSIS/PERMA CATHETER INSERTION;  Surgeon: Marea Selinda RAMAN, MD;  Location: [facility];  Service: Cardiovascular;  Laterality: N/A;   EPIBLEPHERON REPAIR WITH TEAR DUCT PROBING     EYE SURGERY Right 02/09/2014   cataract extraction   INSERTION EXPRESS TUBE SHUNT Right 09/06/2015   Procedure: INSERTION AHMED TUBE SHUNT with tutoplast allograft;  Surgeon: Adine Oneil Novak, MD;  Location: [facility] ORS;  Service: Ophthalmology;  Laterality: Right;   laparoscopic insertion continuous peritoneal dialysis  2022   LEFT HEART CATH AND CORONARY ANGIOGRAPHY N/A 01/20/2019   Procedure: LEFT HEART CATH AND CORONARY ANGIOGRAPHY;  Surgeon: Court Dorn PARAS, MD;  Location: [facility];  Service: Cardiovascular;  Laterality: N/A;   POLYPECTOMY  05/30/2021   Procedure: POLYPECTOMY;  Surgeon: Albertus Gordy HERO, MD;  Location: [facility];  Service: Gastroenterology;;   TEE WITHOUT CARDIOVERSION N/A 01/27/2019   Procedure: TRANSESOPHAGEAL ECHOCARDIOGRAM (TEE);  Surgeon: Shyrl Linnie KIDD, MD;  Location: [facility] OR;  Service: Open Heart Surgery;  Laterality: N/A;   VASECTOMY       Social History:   reports that he quit smoking about 16 years ago. His smoking use included cigarettes. He started smoking about 36 years ago. He has a 10 pack-year smoking history. He has never used smokeless tobacco. He reports that he does not currently use alcohol . He reports that he does not use drugs.   Family History:  His family history includes Diabetes in his father and mother; Heart attack in his father; Hemochromatosis in an other family member; Hyperlipidemia in his father and mother; Hypertension in his father; Liver disease in his mother; Lymphoma in his father; Other in his mother; Polycythemia in an other family member. There is no history of Colon cancer, Esophageal cancer, Rectal cancer, or Stomach cancer.   Allergies Allergies[1]   Home  Medications  Prior to Admission medications  Medication Sig Start Date End Date Taking? Authorizing Provider  acetaminophen  (TYLENOL ) 325 MG tablet Take 1 tablet (325 mg total) by mouth every 6 (six) hours as needed for mild pain (pain score 1-3 or temp > 100.5). 09/06/17  Yes Laliberte, Danielle, PA-C  amLODipine  (NORVASC ) 10 MG tablet Take 0.5 tablets (5 mg total) by mouth daily. 08/30/23  Yes Regalado, Belkys A, MD  ammonium lactate  (AMLACTIN) 12 % cream Apply topically to feet, toes and thickened areas of skin at arms and legs bid 11/19/23  Yes Jackquline Sawyer, MD  aspirin  (ASPIRIN  CHILDRENS) 81 MG chewable tablet Chew 1 tablet (81 mg total) by mouth daily. 03/04/19  Yes Lightfoot, Linnie KIDD, MD  brimonidine -timolol  (COMBIGAN )  0.2-0.5 % ophthalmic solution Place 1 drop into both eyes in the morning and at bedtime.   Yes [provider]  Cholecalciferol  (VITAMIN D3) 1000 units CAPS Take 1,000 Units by mouth in the morning.   Yes [provider]  clobetasol  cream (TEMOVATE ) 0.05 % Apply topically to affected area of thickness at arms and legs qd/bid prn Avoid applying to face, groin, and axilla. Use as directed. Use up to 4 weeks 11/19/23  Yes Jackquline Sawyer, MD  dorzolamide  (TRUSOPT ) 2 % ophthalmic solution Place 1 drop into both eyes 2 (two) times daily. 10/18/19  Yes [provider]  ezetimibe  (ZETIA ) 10 MG tablet TAKE 1 TABLET BY MOUTH EVERYDAY AT BEDTIME 11/02/23  Yes Vincente Saber, NP  FLOMAX  0.4 MG CAPS capsule Take 0.4 mg by mouth at bedtime. 04/03/21  Yes [provider]  fluticasone  (FLONASE ) 50 MCG/ACT nasal spray SPRAY 2 SPRAYS INTO EACH NOSTRIL EVERY DAY 11/14/19  Yes Maribeth Camellia MATSU, MD  gabapentin  (NEURONTIN ) 300 MG capsule Take 1 capsule (300 mg total) by mouth 2 (two) times daily. 07/30/23  Yes Pokhrel, Laxman, MD  gentamicin  cream (GARAMYCIN ) 0.1 % Apply 1 application  topically See admin instructions. Apply around port site as directed 04/05/21  Yes  [provider]  lactulose  (CHRONULAC ) 10 GM/15ML solution TAKE 30 MLS (20 G TOTAL) BY MOUTH AS NEEDED. 11/02/20  Yes Pyrtle, Gordy HERO, MD  latanoprost  (XALATAN ) 0.005 % ophthalmic solution Place 1 drop into both eyes in the morning.   Yes [provider]  loratadine  (CLARITIN ) 10 MG tablet TAKE 1 TABLET BY MOUTH EVERY DAY 04/18/24  Yes Kaur, Charanpreet, NP  metoCLOPramide  (REGLAN ) 10 MG tablet TAKE 1 TABLET (10 MG TOTAL) BY MOUTH AT BEDTIME. PLEASE KEEP YOUR DECEMBER APPOINTMENT FOR FURTHER REFILLS. 04/22/24  Yes McMichael, Bayley M, PA-C  midodrine  (PROAMATINE ) 5 MG tablet Take 5 mg by mouth 2 (two) times daily as needed (if the Systolic number is less than 100). 09/27/20  Yes [provider]  mometasone  (ELOCON ) 0.1 % cream Apply 1 Application topically as directed. Qd to bid to aa right ankle, right lower leg until improved, then prn flares Patient taking differently: Apply 1 Application topically See admin instructions. Apply to affected area of the ankle and lower leg 1-2 times a day until improvement is seen, then as needed for flares 03/30/23  Yes Stewart, Tara, MD  Multiple Vitamin (MULTIVITAMIN ADULT PO) Take 1 tablet by mouth daily.   Yes [provider]  ondansetron  (ZOFRAN -ODT) 4 MG disintegrating tablet Take 4 mg by mouth every 8 (eight) hours as needed for nausea or vomiting (dissolve orally).   Yes [provider]  pantoprazole  (PROTONIX ) 40 MG tablet TAKE 1 TABLET BY MOUTH TWICE A DAY 10/09/23  Yes Kaur, Charanpreet, NP  PARoxetine (PAXIL) 10 MG tablet Take 10 mg by mouth every morning.   Yes [provider]  Potassium Chloride  CR (MICRO-K ) 8 MEQ CPCR capsule CR Take 1 capsule by mouth 2 (two) times daily. 03/11/24  Yes [provider]  torsemide  (DEMADEX ) 20 MG tablet Take 40 mg by mouth 2 (two) times daily. At breakfast and lunchtime   Yes [provider]  Vitamin D , Ergocalciferol , (DRISDOL) 1.25 MG (50000 UNIT) CAPS  capsule Take 50,000 Units by mouth every 14 (fourteen) days.   Yes [provider]  atorvastatin  (LIPITOR ) 80 MG tablet Take 1 tablet (80 mg total) by mouth at bedtime. Patient not taking: Reported on 04/21/2024 08/06/23   Vincente,  Charanpreet, NP  azelastine  (ASTELIN ) 0.1 % nasal spray Place 2 sprays into both nostrils 2 (two) times daily. Use in each nostril as directed Patient not taking: Reported on 04/25/2024 08/30/23   Regalado, Owen A, MD  Continuous Glucose Sensor (DEXCOM G7 SENSOR) MISC Change sensor every 10 days 09/08/23   Shamleffer, Ibtehal Jaralla, MD  Efinaconazole  (JUBLIA ) 10 % SOLN Apply topically to affected toenails at night for onychomycosis Patient not taking: Reported on 04/25/2024 11/19/23   Jackquline Sawyer, MD  guaiFENesin  (MUCINEX ) 600 MG 12 hr tablet Take 1 tablet (600 mg total) by mouth 2 (two) times daily as needed for to loosen phlegm. Patient not taking: Reported on 04/21/2024 08/30/23   Regalado, Belkys A, MD  insulin  degludec (TRESIBA  FLEXTOUCH) 200 UNIT/ML FlexTouch Pen INJECT 42 UNITS INTO THE SKIN IN THE MORNING AND AT BEDTIME. 08/31/23   Shamleffer, Ibtehal Jaralla, MD  insulin  lispro (HUMALOG ) 100 UNIT/ML KwikPen MAX DAILY 45 UNITS 09/28/23   Shamleffer, Ibtehal Jaralla, MD  Insulin  NPH, Human,, Isophane, (HUMULIN  N KWIKPEN) 100 UNIT/ML Kiwkpen INJECT 70 UNITS INTO THE SKIN DAILY IN THE AFTERNOON. TO BE TAKEN AT THE START OF DIALYSIS 01/04/24   Shamleffer, Donell Cardinal, MD  KLOR-CON  M20 20 MEQ tablet Take 20 mEq by mouth in the morning. Patient not taking: Reported on 04/25/2024    [provider]  senna (SENOKOT) 8.6 MG tablet Take 2 tablets by mouth in the morning. Patient not taking: Reported on 04/25/2024    [provider]  sevelamer  carbonate (RENVELA ) 800 MG tablet Take 1 tablet (800 mg total) by mouth 3 (three) times daily with meals. Patient not taking: Reported on 04/21/2024 08/30/23   Regalado, Belkys A, MD  terbinafine  (LAMISIL ) 250 MG tablet  Take 1 tablet daily Patient not taking: Reported on 04/21/2024 11/23/23   Jackquline Sawyer, MD  Testosterone  20.25 MG/ACT (1.62%) GEL Place 2 Pump onto the skin daily in the afternoon. Patient not taking: Reported on 04/25/2024 05/15/23   Shamleffer, Ibtehal Jaralla, MD      DVT/GI PRX  assessed I Assessed the need for Labs I Assessed the need for Foley I Assessed the need for Central Venous Line Family Discussion when available I Assessed the need for Mobilization I made an Assessment of medications to be adjusted accordingly Safety Risk assessment completed  CASE DISCUSSED IN MULTIDISCIPLINARY ROUNDS WITH ICU TEAM    Critical care provider statement:   Total critical care time: 33 minutes   Performed by: Parris MD   Critical care time was exclusive of separately billable procedures and treating other patients.   Critical care was necessary to treat or prevent imminent or life-threatening deterioration.   Critical care was time spent personally by me on the following activities: development of treatment plan with patient and/or surrogate as well as nursing, discussions with consultants, evaluation of patient's response to treatment, examination of patient, obtaining history from patient or surrogate, ordering and performing treatments and interventions, ordering and review of laboratory studies, ordering and review of radiographic studies, pulse oximetry and re-evaluation of patient's condition.    Terrance Usery, M.D.  Pulmonary & Critical Care Medicine             [1]  Allergies Allergen Reactions   Baclofen Other (See Comments)    Disorientation, Contraindication with dialysis    Nsaids Other (See Comments)    CKD on dialysis    "

## 2024-05-15 DEATH — deceased

## 2024-05-17 ENCOUNTER — Ambulatory Visit: Admitting: Gastroenterology

## 2024-06-28 ENCOUNTER — Encounter
